# Patient Record
Sex: Female | Born: 1965 | Race: White | Hispanic: No | Smoking: Current every day smoker
Health system: Southern US, Community
[De-identification: ages and names within clinical notes are randomized; demographics above are authoritative.]

## PROBLEM LIST (undated history)

## (undated) DIAGNOSIS — E875 Hyperkalemia: Secondary | ICD-10-CM

## (undated) DIAGNOSIS — F419 Anxiety disorder, unspecified: Secondary | ICD-10-CM

## (undated) DIAGNOSIS — M549 Dorsalgia, unspecified: Secondary | ICD-10-CM

## (undated) DIAGNOSIS — G459 Transient cerebral ischemic attack, unspecified: Secondary | ICD-10-CM

## (undated) DIAGNOSIS — I779 Disorder of arteries and arterioles, unspecified: Secondary | ICD-10-CM

## (undated) DIAGNOSIS — K759 Inflammatory liver disease, unspecified: Secondary | ICD-10-CM

## (undated) DIAGNOSIS — D649 Anemia, unspecified: Secondary | ICD-10-CM

## (undated) DIAGNOSIS — J45909 Unspecified asthma, uncomplicated: Secondary | ICD-10-CM

## (undated) DIAGNOSIS — G4700 Insomnia, unspecified: Secondary | ICD-10-CM

## (undated) DIAGNOSIS — Z7982 Long term (current) use of aspirin: Secondary | ICD-10-CM

## (undated) DIAGNOSIS — M7582 Other shoulder lesions, left shoulder: Secondary | ICD-10-CM

## (undated) DIAGNOSIS — M48061 Spinal stenosis, lumbar region without neurogenic claudication: Secondary | ICD-10-CM

## (undated) DIAGNOSIS — I272 Pulmonary hypertension, unspecified: Secondary | ICD-10-CM

## (undated) DIAGNOSIS — I2510 Atherosclerotic heart disease of native coronary artery without angina pectoris: Secondary | ICD-10-CM

## (undated) DIAGNOSIS — B1920 Unspecified viral hepatitis C without hepatic coma: Secondary | ICD-10-CM

## (undated) DIAGNOSIS — K760 Fatty (change of) liver, not elsewhere classified: Secondary | ICD-10-CM

## (undated) DIAGNOSIS — I219 Acute myocardial infarction, unspecified: Secondary | ICD-10-CM

## (undated) DIAGNOSIS — F4310 Post-traumatic stress disorder, unspecified: Secondary | ICD-10-CM

## (undated) DIAGNOSIS — R0789 Other chest pain: Secondary | ICD-10-CM

## (undated) DIAGNOSIS — F1190 Opioid use, unspecified, uncomplicated: Secondary | ICD-10-CM

## (undated) DIAGNOSIS — R519 Headache, unspecified: Secondary | ICD-10-CM

## (undated) DIAGNOSIS — E785 Hyperlipidemia, unspecified: Secondary | ICD-10-CM

## (undated) DIAGNOSIS — E7800 Pure hypercholesterolemia, unspecified: Secondary | ICD-10-CM

## (undated) DIAGNOSIS — E876 Hypokalemia: Secondary | ICD-10-CM

## (undated) DIAGNOSIS — F17200 Nicotine dependence, unspecified, uncomplicated: Secondary | ICD-10-CM

## (undated) DIAGNOSIS — K219 Gastro-esophageal reflux disease without esophagitis: Secondary | ICD-10-CM

## (undated) DIAGNOSIS — G959 Disease of spinal cord, unspecified: Secondary | ICD-10-CM

## (undated) DIAGNOSIS — M961 Postlaminectomy syndrome, not elsewhere classified: Secondary | ICD-10-CM

## (undated) DIAGNOSIS — M792 Neuralgia and neuritis, unspecified: Secondary | ICD-10-CM

## (undated) DIAGNOSIS — Z8619 Personal history of other infectious and parasitic diseases: Secondary | ICD-10-CM

## (undated) DIAGNOSIS — I10 Essential (primary) hypertension: Secondary | ICD-10-CM

## (undated) DIAGNOSIS — R110 Nausea: Secondary | ICD-10-CM

## (undated) DIAGNOSIS — I700 Atherosclerosis of aorta: Secondary | ICD-10-CM

## (undated) DIAGNOSIS — M5030 Other cervical disc degeneration, unspecified cervical region: Secondary | ICD-10-CM

## (undated) DIAGNOSIS — R0600 Dyspnea, unspecified: Secondary | ICD-10-CM

## (undated) DIAGNOSIS — G43909 Migraine, unspecified, not intractable, without status migrainosus: Secondary | ICD-10-CM

## (undated) DIAGNOSIS — I739 Peripheral vascular disease, unspecified: Secondary | ICD-10-CM

## (undated) DIAGNOSIS — J449 Chronic obstructive pulmonary disease, unspecified: Secondary | ICD-10-CM

## (undated) DIAGNOSIS — G8929 Other chronic pain: Secondary | ICD-10-CM

## (undated) DIAGNOSIS — I639 Cerebral infarction, unspecified: Secondary | ICD-10-CM

## (undated) DIAGNOSIS — J189 Pneumonia, unspecified organism: Secondary | ICD-10-CM

## (undated) DIAGNOSIS — M75112 Incomplete rotator cuff tear or rupture of left shoulder, not specified as traumatic: Secondary | ICD-10-CM

## (undated) DIAGNOSIS — M1990 Unspecified osteoarthritis, unspecified site: Secondary | ICD-10-CM

## (undated) DIAGNOSIS — K769 Liver disease, unspecified: Secondary | ICD-10-CM

## (undated) HISTORY — PX: DIAGNOSTIC LAPAROSCOPY: SUR761

## (undated) HISTORY — DX: Unspecified osteoarthritis, unspecified site: M19.90

## (undated) HISTORY — DX: Gastro-esophageal reflux disease without esophagitis: K21.9

## (undated) HISTORY — PX: ORIF TIBIA & FIBULA FRACTURES: SHX2131

## (undated) HISTORY — DX: Hypokalemia: E87.6

## (undated) HISTORY — DX: Anxiety disorder, unspecified: F41.9

## (undated) HISTORY — PX: FRACTURE SURGERY: SHX138

## (undated) HISTORY — PX: OVARIAN CYST SURGERY: SHX726

## (undated) HISTORY — DX: Other chest pain: R07.89

## (undated) HISTORY — PX: BACK SURGERY: SHX140

## (undated) HISTORY — DX: Hypomagnesemia: E83.42

## (undated) HISTORY — PX: NASAL SINUS SURGERY: SHX719

## (undated) HISTORY — PX: CATARACT EXTRACTION W/ INTRAOCULAR LENS IMPLANT: SHX1309

## (undated) HISTORY — DX: Atherosclerotic heart disease of native coronary artery without angina pectoris: I25.10

## (undated) HISTORY — PX: OTHER SURGICAL HISTORY: SHX169

## (undated) HISTORY — PX: EYE SURGERY: SHX253

## (undated) HISTORY — PX: ORIF FEMUR FRACTURE: SHX2119

## (undated) HISTORY — DX: Opioid use, unspecified, uncomplicated: F11.90

## (undated) HISTORY — PX: OVARY SURGERY: SHX727

## (undated) HISTORY — PX: HEMORRHOID SURGERY: SHX153

---

## 2001-09-21 ENCOUNTER — Emergency Department (HOSPITAL_COMMUNITY): Admission: EM | Admit: 2001-09-21 | Discharge: 2001-09-21 | Payer: Self-pay | Admitting: Emergency Medicine

## 2001-11-04 ENCOUNTER — Encounter: Payer: Self-pay | Admitting: Emergency Medicine

## 2001-11-04 ENCOUNTER — Emergency Department (HOSPITAL_COMMUNITY): Admission: EM | Admit: 2001-11-04 | Discharge: 2001-11-04 | Payer: Self-pay | Admitting: Emergency Medicine

## 2001-11-17 ENCOUNTER — Encounter: Payer: Self-pay | Admitting: Emergency Medicine

## 2001-11-17 ENCOUNTER — Emergency Department (HOSPITAL_COMMUNITY): Admission: EM | Admit: 2001-11-17 | Discharge: 2001-11-17 | Payer: Self-pay

## 2002-01-19 ENCOUNTER — Encounter: Payer: Self-pay | Admitting: Emergency Medicine

## 2002-01-19 ENCOUNTER — Emergency Department (HOSPITAL_COMMUNITY): Admission: EM | Admit: 2002-01-19 | Discharge: 2002-01-19 | Payer: Self-pay | Admitting: Emergency Medicine

## 2003-05-05 ENCOUNTER — Emergency Department (HOSPITAL_COMMUNITY): Admission: EM | Admit: 2003-05-05 | Discharge: 2003-05-05 | Payer: Self-pay | Admitting: Emergency Medicine

## 2007-04-24 ENCOUNTER — Emergency Department (HOSPITAL_COMMUNITY): Admission: EM | Admit: 2007-04-24 | Discharge: 2007-04-24 | Payer: Self-pay | Admitting: Emergency Medicine

## 2007-04-27 ENCOUNTER — Emergency Department (HOSPITAL_COMMUNITY): Admission: EM | Admit: 2007-04-27 | Discharge: 2007-04-27 | Payer: Self-pay | Admitting: Emergency Medicine

## 2007-05-02 ENCOUNTER — Emergency Department (HOSPITAL_COMMUNITY): Admission: EM | Admit: 2007-05-02 | Discharge: 2007-05-02 | Payer: Self-pay | Admitting: Emergency Medicine

## 2009-06-24 HISTORY — PX: LEFT HEART CATH AND CORONARY ANGIOGRAPHY: CATH118249

## 2010-06-24 DIAGNOSIS — I219 Acute myocardial infarction, unspecified: Secondary | ICD-10-CM

## 2010-06-24 HISTORY — DX: Acute myocardial infarction, unspecified: I21.9

## 2010-07-15 ENCOUNTER — Emergency Department (HOSPITAL_COMMUNITY)
Admission: EM | Admit: 2010-07-15 | Discharge: 2010-07-15 | Payer: Self-pay | Source: Home / Self Care | Admitting: Emergency Medicine

## 2011-09-04 DIAGNOSIS — R053 Chronic cough: Secondary | ICD-10-CM | POA: Insufficient documentation

## 2011-09-04 DIAGNOSIS — J209 Acute bronchitis, unspecified: Secondary | ICD-10-CM | POA: Insufficient documentation

## 2011-09-04 DIAGNOSIS — R05 Cough: Secondary | ICD-10-CM | POA: Insufficient documentation

## 2011-09-04 DIAGNOSIS — G4733 Obstructive sleep apnea (adult) (pediatric): Secondary | ICD-10-CM | POA: Insufficient documentation

## 2012-06-24 DIAGNOSIS — G459 Transient cerebral ischemic attack, unspecified: Secondary | ICD-10-CM

## 2012-06-24 HISTORY — DX: Transient cerebral ischemic attack, unspecified: G45.9

## 2014-01-04 HISTORY — DX: Hypomagnesemia: E83.42

## 2014-03-03 DIAGNOSIS — A4101 Sepsis due to Methicillin susceptible Staphylococcus aureus: Secondary | ICD-10-CM

## 2014-03-03 HISTORY — DX: Sepsis due to methicillin susceptible Staphylococcus aureus: A41.01

## 2014-03-07 DIAGNOSIS — A4101 Sepsis due to Methicillin susceptible Staphylococcus aureus: Secondary | ICD-10-CM | POA: Insufficient documentation

## 2014-03-19 DIAGNOSIS — D638 Anemia in other chronic diseases classified elsewhere: Secondary | ICD-10-CM | POA: Insufficient documentation

## 2014-03-19 DIAGNOSIS — F1110 Opioid abuse, uncomplicated: Secondary | ICD-10-CM | POA: Insufficient documentation

## 2014-03-19 DIAGNOSIS — F1910 Other psychoactive substance abuse, uncomplicated: Secondary | ICD-10-CM | POA: Insufficient documentation

## 2014-07-07 HISTORY — PX: LEFT HEART CATH AND CORONARY ANGIOGRAPHY: CATH118249

## 2014-07-08 DIAGNOSIS — F17219 Nicotine dependence, cigarettes, with unspecified nicotine-induced disorders: Secondary | ICD-10-CM | POA: Insufficient documentation

## 2014-09-03 DIAGNOSIS — F1910 Other psychoactive substance abuse, uncomplicated: Secondary | ICD-10-CM | POA: Insufficient documentation

## 2014-09-03 DIAGNOSIS — F411 Generalized anxiety disorder: Secondary | ICD-10-CM | POA: Insufficient documentation

## 2014-09-03 DIAGNOSIS — F17200 Nicotine dependence, unspecified, uncomplicated: Secondary | ICD-10-CM | POA: Insufficient documentation

## 2014-09-03 DIAGNOSIS — I739 Peripheral vascular disease, unspecified: Secondary | ICD-10-CM | POA: Insufficient documentation

## 2014-09-03 DIAGNOSIS — F1120 Opioid dependence, uncomplicated: Secondary | ICD-10-CM | POA: Insufficient documentation

## 2015-01-17 ENCOUNTER — Emergency Department
Admission: EM | Admit: 2015-01-17 | Discharge: 2015-01-17 | Disposition: A | Discharge status: Home / Self Care | Payer: Medicaid Other | Attending: Emergency Medicine | Admitting: Emergency Medicine

## 2015-01-17 ENCOUNTER — Encounter: Payer: Self-pay | Admitting: Student

## 2015-01-17 DIAGNOSIS — I10 Essential (primary) hypertension: Secondary | ICD-10-CM | POA: Insufficient documentation

## 2015-01-17 DIAGNOSIS — M545 Low back pain, unspecified: Secondary | ICD-10-CM

## 2015-01-17 DIAGNOSIS — Z720 Tobacco use: Secondary | ICD-10-CM | POA: Insufficient documentation

## 2015-01-17 HISTORY — DX: Atherosclerotic heart disease of native coronary artery without angina pectoris: I25.10

## 2015-01-17 HISTORY — DX: Essential (primary) hypertension: I10

## 2015-01-17 MED ORDER — TIZANIDINE HCL 4 MG PO CAPS: 4.0000 mg | ORAL_CAPSULE | Freq: Four times a day (QID) | ORAL | Status: DC | PRN

## 2015-01-17 MED ORDER — OXYCODONE-ACETAMINOPHEN 5-325 MG PO TABS: 1.0000 | ORAL_TABLET | ORAL | Status: DC | PRN

## 2015-01-17 MED ORDER — PROMETHAZINE HCL 25 MG/ML IJ SOLN
12.5000 mg | Freq: Once | INTRAMUSCULAR | Status: AC
  Administered 2015-01-17: 12.5 mg via INTRAMUSCULAR
  Filled 2015-01-17: qty 1

## 2015-01-17 MED ORDER — MEPERIDINE HCL 25 MG/ML IJ SOLN
25.0000 mg | Freq: Once | INTRAMUSCULAR | Status: AC
  Administered 2015-01-17: 25 mg via INTRAMUSCULAR
  Filled 2015-01-17: qty 1

## 2015-01-17 NOTE — ED Notes (Signed)
Chronic back pain 

## 2015-01-17 NOTE — ED Notes (Signed)
Pt reports back pain for two days and noticed a "knot pop up". Pt points to right side, lower back.  States hurts worse when she moves. Denies injury.

## 2015-01-17 NOTE — Discharge Instructions (Signed)
Chronic Back Pain  When back pain lasts longer than 3 months, it is called chronic back pain.People with chronic back pain often go through certain periods that are more intense (flare-ups).  CAUSES Chronic back pain can be caused by wear and tear (degeneration) on different structures in your back. These structures include:  The bones of your spine (vertebrae) and the joints surrounding your spinal cord and nerve roots (facets).  The strong, fibrous tissues that connect your vertebrae (ligaments). Degeneration of these structures may result in pressure on your nerves. This can lead to constant pain. HOME CARE INSTRUCTIONS  Avoid bending, heavy lifting, prolonged sitting, and activities which make the problem worse.  Take brief periods of rest throughout the day to reduce your pain. Lying down or standing usually is better than sitting while you are resting.  Take over-the-counter or prescription medicines only as directed by your caregiver. SEEK IMMEDIATE MEDICAL CARE IF:   You have weakness or numbness in one of your legs or feet.  You have trouble controlling your bladder or bowels.  You have nausea, vomiting, abdominal pain, shortness of breath, or fainting. Document Released: 07/18/2004 Document Revised: 09/02/2011 Document Reviewed: 05/25/2011 [facility] Patient Information 2015 [place], [place]. This information is not intended to replace advice given to you by your health care provider. Make sure you discuss any questions you have with your health care provider.  Back Pain, Adult Low back pain is very common. About 1 in 5 people have back pain.The cause of low back pain is rarely dangerous. The pain often gets better over time.About half of people with a sudden onset of back pain feel better in just 2 weeks. About 8 in 10 people feel better by 6 weeks.  CAUSES Some common causes of back pain include:  Strain of the muscles or ligaments supporting the spine.  Wear and tear  (degeneration) of the spinal discs.  Arthritis.  Direct injury to the back. DIAGNOSIS Most of the time, the direct cause of low back pain is not known.However, back pain can be treated effectively even when the exact cause of the pain is unknown.Answering your caregiver's questions about your overall health and symptoms is one of the most accurate ways to make sure the cause of your pain is not dangerous. If your caregiver needs more information, he or she may order lab work or imaging tests (X-rays or MRIs).However, even if imaging tests show changes in your back, this usually does not require surgery. HOME CARE INSTRUCTIONS For many people, back pain returns.Since low back pain is rarely dangerous, it is often a condition that people can learn to [facility] their own.   Remain active. It is stressful on the back to sit or stand in one place. Do not sit, drive, or stand in one place for more than 30 minutes at a time. Take short walks on level surfaces as soon as pain allows.Try to increase the length of time you walk each day.  Do not stay in bed.Resting more than 1 or 2 days can delay your recovery.  Do not avoid exercise or work.Your body is made to move.It is not dangerous to be active, even though your back may hurt.Your back will likely heal faster if you return to being active before your pain is gone.  Pay attention to your body when you bend and lift. Many people have less discomfortwhen lifting if they bend their knees, keep the load close to their bodies,and avoid twisting. Often, the most comfortable  positions are those that put less stress on your recovering back.  Find a comfortable position to sleep. Use a firm mattress and lie on your side with your knees slightly bent. If you lie on your back, put a pillow under your knees.  Only take over-the-counter or prescription medicines as directed by your caregiver. Over-the-counter medicines to reduce pain and inflammation  are often the most helpful.Your caregiver may prescribe muscle relaxant drugs.These medicines help dull your pain so you can more quickly return to your normal activities and healthy exercise.  Put ice on the injured area.  Put ice in a plastic bag.  Place a towel between your skin and the bag.  Leave the ice on for 15-20 minutes, 03-04 times a day for the first 2 to 3 days. After that, ice and heat may be alternated to reduce pain and spasms.  Ask your caregiver about trying back exercises and gentle massage. This may be of some benefit.  Avoid feeling anxious or stressed.Stress increases muscle tension and can worsen back pain.It is important to recognize when you are anxious or stressed and learn ways to manage it.Exercise is a great option. SEEK MEDICAL CARE IF:  You have pain that is not relieved with rest or medicine.  You have pain that does not improve in 1 week.  You have new symptoms.  You are generally not feeling well. SEEK IMMEDIATE MEDICAL CARE IF:   You have pain that radiates from your back into your legs.  You develop new bowel or bladder control problems.  You have unusual weakness or numbness in your arms or legs.  You develop nausea or vomiting.  You develop abdominal pain.  You feel faint. Document Released: 06/10/2005 Document Revised: 12/10/2011 Document Reviewed: 10/12/2013 [facility] Patient Information 2015 [place], [place]. This information is not intended to replace advice given to you by your health care provider. Make sure you discuss any questions you have with your health care provider.

## 2015-01-17 NOTE — ED Provider Notes (Signed)
[facility] Emergency Department Provider Note  ____________________________________________  Time seen: Approximately 4:42 PM  I have reviewed the triage vital signs and the nursing notes.   HISTORY  Chief Complaint Back Pain   HPI Sara Munoz is a 49 y.o. female presents emergency room with complaints of right lower back pain. Patient has ongoing history of same and has recently moved here from [place], [place]. States attempted to lift and move her safe which weighs about 200 pounds.  Past Medical History  Diagnosis Date  . Hypertension   . Coronary artery disease     There are no active problems to display for this patient.   Past Surgical History  Procedure Laterality Date  . Orif femur fracture    . Orif tibia & fibula fractures      Current Outpatient Rx  Name  Route  Sig  Dispense  Refill  . oxyCODONE-acetaminophen (ROXICET) 5-325 MG per tablet   Oral   Take 1-2 tablets by mouth every 4 (four) hours as needed for severe pain.   15 tablet   0   . tiZANidine (ZANAFLEX) 4 MG capsule   Oral   Take 1 capsule (4 mg total) by mouth 4 (four) times daily as needed for muscle spasms.   40 capsule   0     Allergies Flexeril; Keflex; Toradol; and Tramadol  No family history on file.  Social History History  Substance Use Topics  . Smoking status: Current Every Day Smoker -- 1.00 packs/day    Types: Cigarettes  . Smokeless tobacco: Never Used  . Alcohol Use: No    Review of Systems Constitutional: No fever/chills Eyes: No visual changes. ENT: No sore throat. Cardiovascular: Denies chest pain. Respiratory: Denies shortness of breath. Gastrointestinal: No abdominal pain.  No nausea, no vomiting.  No diarrhea.  No constipation. Genitourinary: Negative for dysuria. Musculoskeletal: Positive for right lower back pain. Skin: Negative for rash. Neurological: Negative for headaches, focal weakness or numbness.  10-point ROS  otherwise negative.  ____________________________________________   PHYSICAL EXAM:  VITAL SIGNS: ED Triage Vitals  Enc Vitals Group     BP 01/17/15 1613 129/82 mmHg     Pulse Rate 01/17/15 1613 70     Resp 01/17/15 1613 18     Temp 01/17/15 1613 98.6 F (37 C)     Temp Source 01/17/15 1613 Oral     SpO2 01/17/15 1613 99 %     Weight 01/17/15 1613 160 lb (72.576 kg)     Height 01/17/15 1613 5\' 4"  (1.626 m)     Head Cir --      Peak Flow --      Pain Score 01/17/15 1614 10     Pain Loc --      Pain Edu? --      Excl. in [place]? --     Constitutional: Alert and oriented. Well appearing and in no acute distress.  Cardiovascular: Normal rate, regular rhythm. Grossly normal heart sounds.  Good peripheral circulation. Respiratory: Normal respiratory effort.  No retractions. Lungs CTAB. Musculoskeletal: Straight-leg raise positive right about 30 , left 45. Positive point tenderness to right paraspinal lumbar muscles Neurologic:  Normal speech and language. No gross focal neurologic deficits are appreciated. No gait instability. Skin:  Skin is warm, dry and intact. No rash noted. Psychiatric: Mood and affect are normal. Speech and behavior are normal.  ____________________________________________   LABS (all labs ordered are listed, but only abnormal results are displayed)  Labs Reviewed - No data to display ____________________________________________     PROCEDURES  Procedure(s) performed: None  Critical Care performed: No  ____________________________________________   INITIAL IMPRESSION / ASSESSMENT AND PLAN / ED COURSE  Pertinent labs & imaging results that were available during my care of the patient were reviewed by me and considered in my medical decision making (see chart for details).  Recurrent low back pain. Rx given for Zanaflex 4 mg 4 times a day, Norco. Patient encouraged to establish local provider and follow up with pain management as directed. She  voices no other emergency medical complaints at this visit. ____________________________________________   FINAL CLINICAL IMPRESSION(S) / ED DIAGNOSES  Final diagnoses:  Back pain at L4-L5 level      [name], [name] 01/17/15 [place], MD 01/17/15 2210

## 2015-02-01 ENCOUNTER — Encounter: Payer: Self-pay | Admitting: Emergency Medicine

## 2015-02-01 ENCOUNTER — Emergency Department: Payer: Medicaid Other

## 2015-02-01 ENCOUNTER — Emergency Department
Admission: EM | Admit: 2015-02-01 | Discharge: 2015-02-01 | Disposition: A | Discharge status: Home / Self Care | Payer: Medicaid Other | Attending: Emergency Medicine | Admitting: Emergency Medicine

## 2015-02-01 ENCOUNTER — Emergency Department
Admission: EM | Admit: 2015-02-01 | Discharge: 2015-02-01 | Disposition: A | Discharge status: Home / Self Care | Payer: Medicaid Other | Source: Home / Self Care | Attending: Emergency Medicine | Admitting: Emergency Medicine

## 2015-02-01 DIAGNOSIS — Z720 Tobacco use: Secondary | ICD-10-CM | POA: Diagnosis not present

## 2015-02-01 DIAGNOSIS — Z79899 Other long term (current) drug therapy: Secondary | ICD-10-CM | POA: Diagnosis not present

## 2015-02-01 DIAGNOSIS — Z760 Encounter for issue of repeat prescription: Secondary | ICD-10-CM | POA: Diagnosis present

## 2015-02-01 DIAGNOSIS — Z7951 Long term (current) use of inhaled steroids: Secondary | ICD-10-CM | POA: Insufficient documentation

## 2015-02-01 DIAGNOSIS — G8929 Other chronic pain: Secondary | ICD-10-CM

## 2015-02-01 DIAGNOSIS — G894 Chronic pain syndrome: Secondary | ICD-10-CM

## 2015-02-01 DIAGNOSIS — R079 Chest pain, unspecified: Secondary | ICD-10-CM

## 2015-02-01 DIAGNOSIS — I10 Essential (primary) hypertension: Secondary | ICD-10-CM | POA: Diagnosis not present

## 2015-02-01 LAB — CBC
HCT: 38.6 % (ref 35.0–47.0)
Hemoglobin: 12.8 g/dL (ref 12.0–16.0)
MCH: 27.3 pg (ref 26.0–34.0)
MCHC: 33.3 g/dL (ref 32.0–36.0)
MCV: 82.1 fL (ref 80.0–100.0)
Platelets: 244 10*3/uL (ref 150–440)
RBC: 4.7 MIL/uL (ref 3.80–5.20)
RDW: 16.7 % — ABNORMAL HIGH (ref 11.5–14.5)
WBC: 6.7 10*3/uL (ref 3.6–11.0)

## 2015-02-01 LAB — BASIC METABOLIC PANEL
Anion gap: 8 (ref 5–15)
BUN: 6 mg/dL (ref 6–20)
CO2: 25 mmol/L (ref 22–32)
Calcium: 8.9 mg/dL (ref 8.9–10.3)
Chloride: 110 mmol/L (ref 101–111)
Creatinine, Ser: 0.78 mg/dL (ref 0.44–1.00)
GFR calc Af Amer: >60 mL/min (ref >60)
GFR calc non Af Amer: >60 mL/min (ref >60)
Glucose, Bld: 110 mg/dL — ABNORMAL HIGH (ref 65–99)
Potassium: 3.3 mmol/L — ABNORMAL LOW (ref 3.5–5.1)
Sodium: 143 mmol/L (ref 135–145)

## 2015-02-01 LAB — TROPONIN I
Troponin I: <0.03 ng/mL (ref <0.031)
Troponin I: <0.03 ng/mL (ref <0.031)

## 2015-02-01 MED ORDER — MORPHINE SULFATE 2 MG/ML IJ SOLN
2.0000 mg | Freq: Once | INTRAMUSCULAR | Status: AC
  Administered 2015-02-01: 2 mg via INTRAVENOUS
  Filled 2015-02-01: qty 1

## 2015-02-01 MED ORDER — PROMETHAZINE HCL 25 MG/ML IJ SOLN
25.0000 mg | Freq: Once | INTRAMUSCULAR | Status: AC
  Administered 2015-02-01: 25 mg via INTRAMUSCULAR
  Filled 2015-02-01: qty 1

## 2015-02-01 MED ORDER — OXYCODONE-ACETAMINOPHEN 5-325 MG PO TABS
1.0000 | ORAL_TABLET | Freq: Once | ORAL | Status: AC
  Administered 2015-02-01: 1 via ORAL
  Filled 2015-02-01: qty 1

## 2015-02-01 MED ORDER — MEPERIDINE HCL 25 MG/ML IJ SOLN
25.0000 mg | Freq: Once | INTRAMUSCULAR | Status: AC
  Administered 2015-02-01: 25 mg via INTRAMUSCULAR
  Filled 2015-02-01: qty 1

## 2015-02-01 NOTE — ED Notes (Addendum)
Pt to triage via w/c with no distress noted; pt c/o "pain all over"; has been seen here recently; st out of medication x month (90mg  morphine and 40mg  oxycodone daily); st unable to get appointment with pain clinic for her chronic pain control; st when called she was told she needs a PCP to make appt; st when she left here ED physician only gave her a f/u referral to Dr Primus Bravo, not a PCP; pt st "I tried to call the [facility] clinic today but they wouldn't answer the phone"

## 2015-02-01 NOTE — Discharge Instructions (Signed)
Emergency care providers appreciate that many patients coming to [place] are in severe pain and we wish to address their pain in the safest, most responsible manner.  It is important to recognize, however, that the proper treatment of chronic pain differs from that of the pain of injuries and acute illnesses.  Our goal is to provider quality, safe, personalized care and we thank you for giving [place] the opportunity to serve you.  The use of narcotics and related agents for chronic pain syndromes may lead to additional physical and psychological problems.  Nearly as many people die from prescription narcotics each year as die from car crashes.  Additionally, this risk is increased if such prescriptions are obtained from a variety of sources.  Therefore, only your primary care physician or a pain management specialist is able to safely treat such syndromes with narcotic medications long-term.  Documentation revealing such prescriptions have been sought from multiple sources may prohibit [place] from providing a refill or different narcotic medication.  Your name may be checked first through the [facility].  This database is a record of controlled substance medication prescriptions that the patient has received.  This has been established by [facility] in an effort to eliminate the dangerous, and often life-threatening, practice of obtaining multiple prescriptions from different medical providers.  If you have a chronic pain syndrome (i.e. chronic headaches, recurrent back or neck pain, dental pain, abdominal or pelvic pain without a specific diagnosis, or neuropathic pain such as fibromyalgia) or recurrent visits for the same condition without an acute diagnosis, you may be treated with non-narcotics and other non-addictive medicines.  Allergic reactions or negative side effects that may be reported by a patient to such medications will not typically lead to the use of a  narcotic analgesic or other controlled substance as an alternative.  Patients managing chronic pain with a personal physician should have provisions in place for breakthrough pain.  If you are in crisis, you should call your physician.  If your physician directs you to the emergency department, please have the doctor call and speak to our attending physician concerning your care.  When patients come to the Emergency Department (ED) with acute medical conditions in which the ED physician feels it is appropriate to prescribe narcotic or sedating pain medication, the physician will prescribe these in very limited quantities.  The amount of these medications will last only until you can see your primary care physician in his/her office.  Any patient who returns to the ED seeking refills should expect only non-narcotic pain medications.  In the event an acute medical condition exists and the emergency physician feels it is necessary that the patient be given a narcotic or sedating medication, a responsible adult driver should be present in the room prior to the medication being given by the nurse.  Prescriptions for narcotic or sedating medications that have been lost, stolen, or expired will NOT be refilled in the ED.  Patients who have chronic pain may receive non-narcotic prescriptions until seen by their primary care physician.  It is every patient's personal responsibility to maintain active prescriptions with his or her primary care physician or specialist. Chronic Back Pain  When back pain lasts longer than 3 months, it is called chronic back pain.People with chronic back pain often go through certain periods that are more intense (flare-ups).  CAUSES Chronic back pain can be caused by wear and tear (degeneration) on different structures in your back. These  structures include:  The bones of your spine (vertebrae) and the joints surrounding your spinal cord and nerve roots (facets).  The strong,  fibrous tissues that connect your vertebrae (ligaments). Degeneration of these structures may result in pressure on your nerves. This can lead to constant pain. HOME CARE INSTRUCTIONS  Avoid bending, heavy lifting, prolonged sitting, and activities which make the problem worse.  Take brief periods of rest throughout the day to reduce your pain. Lying down or standing usually is better than sitting while you are resting.  Take over-the-counter or prescription medicines only as directed by your caregiver. SEEK IMMEDIATE MEDICAL CARE IF:   You have weakness or numbness in one of your legs or feet.  You have trouble controlling your bladder or bowels.  You have nausea, vomiting, abdominal pain, shortness of breath, or fainting. Document Released: 07/18/2004 Document Revised: 09/02/2011 Document Reviewed: 05/25/2011 [facility] Patient Information 2015 [place], [place]. This information is not intended to replace advice given to you by your health care provider. Make sure you discuss any questions you have with your health care provider.  Chronic Pain Chronic pain can be defined as pain that is off and on and lasts for 3-6 months or longer. Many things cause chronic pain, which can make it difficult to make a diagnosis. There are many treatment options available for chronic pain. However, finding a treatment that works well for you may require trying various approaches until the right one is found. Many people benefit from a combination of two or more types of treatment to control their pain. SYMPTOMS  Chronic pain can occur anywhere in the body and can range from mild to very severe. Some types of chronic pain include:  Headache.  Low back pain.  Cancer pain.  Arthritis pain.  Neurogenic pain. This is pain resulting from damage to nerves. People with chronic pain may also have other symptoms such as:  Depression.  Anger.  Insomnia.  Anxiety. DIAGNOSIS  Your health care provider  will help diagnose your condition over time. In many cases, the initial focus will be on excluding possible conditions that could be causing the pain. Depending on your symptoms, your health care provider may order tests to diagnose your condition. Some of these tests may include:   Blood tests.   CT scan.   MRI.   X-rays.   Ultrasounds.   Nerve conduction studies.  You may need to see a specialist.  TREATMENT  Finding treatment that works well may take time. You may be referred to a pain specialist. He or she may prescribe medicine or therapies, such as:   Mindful meditation or yoga.  Shots (injections) of numbing or pain-relieving medicines into the spine or area of pain.  Local electrical stimulation.  Acupuncture.   Massage therapy.   Aroma, color, light, or sound therapy.   Biofeedback.   Working with a physical therapist to keep from getting stiff.   Regular, gentle exercise.   Cognitive or behavioral therapy.   Group support.  Sometimes, surgery may be recommended.  HOME CARE INSTRUCTIONS   Take all medicines as directed by your health care provider.   Lessen stress in your life by relaxing and doing things such as listening to calming music.   Exercise or be active as directed by your health care provider.   Eat a healthy diet and include things such as vegetables, fruits, fish, and lean meats in your diet.   Keep all follow-up appointments with your health care provider.  Attend a support group with others suffering from chronic pain. SEEK MEDICAL CARE IF:   Your pain gets worse.   You develop a new pain that was not there before.   You cannot tolerate medicines given to you by your health care provider.   You have new symptoms since your last visit with your health care provider.  SEEK IMMEDIATE MEDICAL CARE IF:   You feel weak.   You have decreased sensation or numbness.   You lose control of bowel or bladder  function.   Your pain suddenly gets much worse.   You develop shaking.  You develop chills.  You develop confusion.  You develop chest pain.  You develop shortness of breath.  MAKE SURE YOU:  Understand these instructions.  Will watch your condition.  Will get help right away if you are not doing well or get worse.  Document Released: 03/02/2002 Document Revised: 02/10/2013 Document Reviewed: 12/04/2012 [facility] Patient Information 2015 [place], [place]. This information is not intended to replace advice given to you by your health care provider. Make sure you discuss any questions you have with your health care provider.  ------------------------------------------------------------------------------------------------------------------------------------------------------------------------------------------------------------------------------------------------------------------- Emergency care providers appreciate that many patients coming to [place] are in severe pain and we wish to address their pain in the safest, most responsible manner.  It is important to recognize, however, that the proper treatment of chronic pain differs from that of the pain of injuries and acute illnesses.  Our goal is to provider quality, safe, personalized care and we thank you for giving [place] the opportunity to serve you.  The use of narcotics and related agents for chronic pain syndromes may lead to additional physical and psychological problems.  Nearly as many people die from prescription narcotics each year as die from car crashes.  Additionally, this risk is increased if such prescriptions are obtained from a variety of sources.  Therefore, only your primary care physician or a pain management specialist is able to safely treat such syndromes with narcotic medications long-term.  Documentation revealing such prescriptions have been sought from multiple sources may prohibit [place] from providing a refill or  different narcotic medication.  Your name may be checked first through the [facility].  This database is a record of controlled substance medication prescriptions that the patient has received.  This has been established by [facility] in an effort to eliminate the dangerous, and often life-threatening, practice of obtaining multiple prescriptions from different medical providers.  If you have a chronic pain syndrome (i.e. chronic headaches, recurrent back or neck pain, dental pain, abdominal or pelvic pain without a specific diagnosis, or neuropathic pain such as fibromyalgia) or recurrent visits for the same condition without an acute diagnosis, you may be treated with non-narcotics and other non-addictive medicines.  Allergic reactions or negative side effects that may be reported by a patient to such medications will not typically lead to the use of a narcotic analgesic or other controlled substance as an alternative.  Patients managing chronic pain with a personal physician should have provisions in place for breakthrough pain.  If you are in crisis, you should call your physician.  If your physician directs you to the emergency department, please have the doctor call and speak to our attending physician concerning your care.  When patients come to the Emergency Department (ED) with acute medical conditions in which the ED physician feels it is appropriate to prescribe narcotic or sedating pain medication, the physician will  prescribe these in very limited quantities.  The amount of these medications will last only until you can see your primary care physician in his/her office.  Any patient who returns to the ED seeking refills should expect only non-narcotic pain medications.  In the event an acute medical condition exists and the emergency physician feels it is necessary that the patient be given a narcotic or sedating medication, a responsible adult  driver should be present in the room prior to the medication being given by the nurse.  Prescriptions for narcotic or sedating medications that have been lost, stolen, or expired will NOT be refilled in the ED.  Patients who have chronic pain may receive non-narcotic prescriptions until seen by their primary care physician.  It is every patient's personal responsibility to maintain active prescriptions with his or her primary care physician or specialist.

## 2015-02-01 NOTE — Discharge Instructions (Signed)
Chest Pain (Nonspecific) °It is often hard to give a specific diagnosis for the cause of chest pain. There is always a chance that your pain could be related to something serious, such as a heart attack or a blood clot in the lungs. You need to follow up with your health care provider for further evaluation. °CAUSES  °· Heartburn. °· Pneumonia or bronchitis. °· Anxiety or stress. °· Inflammation around your heart (pericarditis) or lung (pleuritis or pleurisy). °· A blood clot in the lung. °· A collapsed lung (pneumothorax). It can develop suddenly on its own (spontaneous pneumothorax) or from trauma to the chest. °· Shingles infection (herpes zoster virus). °The chest wall is composed of bones, muscles, and cartilage. Any of these can be the source of the pain. °· The bones can be bruised by injury. °· The muscles or cartilage can be strained by coughing or overwork. °· The cartilage can be affected by inflammation and become sore (costochondritis). °DIAGNOSIS  °Lab tests or other studies may be needed to find the cause of your pain. Your health care provider may have you take a test called an ambulatory electrocardiogram (ECG). An ECG records your heartbeat patterns over a 24-hour period. You may also have other tests, such as: °· Transthoracic echocardiogram (TTE). During echocardiography, sound waves are used to evaluate how blood flows through your heart. °· Transesophageal echocardiogram (TEE). °· Cardiac monitoring. This allows your health care provider to monitor your heart rate and rhythm in real time. °· Holter monitor. This is a portable device that records your heartbeat and can help diagnose heart arrhythmias. It allows your health care provider to track your heart activity for several days, if needed. °· Stress tests by exercise or by giving medicine that makes the heart beat faster. °TREATMENT  °· Treatment depends on what may be causing your chest pain. Treatment may include: °· Acid blockers for  heartburn. °· Anti-inflammatory medicine. °· Pain medicine for inflammatory conditions. °· Antibiotics if an infection is present. °· You may be advised to change lifestyle habits. This includes stopping smoking and avoiding alcohol, caffeine, and chocolate. °· You may be advised to keep your head raised (elevated) when sleeping. This reduces the chance of acid going backward from your stomach into your esophagus. °Most of the time, nonspecific chest pain will improve within 2-3 days with rest and mild pain medicine.  °HOME CARE INSTRUCTIONS  °· If antibiotics were prescribed, take them as directed. Finish them even if you start to feel better. °· For the next few days, avoid physical activities that bring on chest pain. Continue physical activities as directed. °· Do not use any tobacco products, including cigarettes, chewing tobacco, or electronic cigarettes. °· Avoid drinking alcohol. °· Only take medicine as directed by your health care provider. °· Follow your health care provider's suggestions for further testing if your chest pain does not go away. °· Keep any follow-up appointments you made. If you do not go to an appointment, you could develop lasting (chronic) problems with pain. If there is any problem keeping an appointment, call to reschedule. °SEEK MEDICAL CARE IF:  °· Your chest pain does not go away, even after treatment. °· You have a rash with blisters on your chest. °· You have a fever. °SEEK IMMEDIATE MEDICAL CARE IF:  °· You have increased chest pain or pain that spreads to your arm, neck, jaw, back, or abdomen. °· You have shortness of breath. °· You have an increasing cough, or you cough   up blood.  You have severe back or abdominal pain.  You feel nauseous or vomit.  You have severe weakness.  You faint.  You have chills. This is an emergency. Do not wait to see if the pain will go away. Get medical help at once. Call your local emergency services (911 in [place] Do not drive  yourself to the hospital. MAKE SURE YOU:   Understand these instructions.  Will watch your condition.  Will get help right away if you are not doing well or get worse. Document Released: 03/20/2005 Document Revised: 06/15/2013 Document Reviewed: 01/14/2008 [facility] Patient Information 2015 [place], [place]. This information is not intended to replace advice given to you by your health care provider. Make sure you discuss any questions you have with your health care provider.  Chronic Pain Chronic pain can be defined as pain that is off and on and lasts for 3-6 months or longer. Many things cause chronic pain, which can make it difficult to make a diagnosis. There are many treatment options available for chronic pain. However, finding a treatment that works well for you may require trying various approaches until the right one is found. Many people benefit from a combination of two or more types of treatment to control their pain. SYMPTOMS  Chronic pain can occur anywhere in the body and can range from mild to very severe. Some types of chronic pain include:  Headache.  Low back pain.  Cancer pain.  Arthritis pain.  Neurogenic pain. This is pain resulting from damage to nerves. People with chronic pain may also have other symptoms such as:  Depression.  Anger.  Insomnia.  Anxiety. DIAGNOSIS  Your health care provider will help diagnose your condition over time. In many cases, the initial focus will be on excluding possible conditions that could be causing the pain. Depending on your symptoms, your health care provider may order tests to diagnose your condition. Some of these tests may include:   Blood tests.   CT scan.   MRI.   X-rays.   Ultrasounds.   Nerve conduction studies.  You may need to see a specialist.  TREATMENT  Finding treatment that works well may take time. You may be referred to a pain specialist. He or she may prescribe medicine or therapies,  such as:   Mindful meditation or yoga.  Shots (injections) of numbing or pain-relieving medicines into the spine or area of pain.  Local electrical stimulation.  Acupuncture.   Massage therapy.   Aroma, color, light, or sound therapy.   Biofeedback.   Working with a physical therapist to keep from getting stiff.   Regular, gentle exercise.   Cognitive or behavioral therapy.   Group support.  Sometimes, surgery may be recommended.  HOME CARE INSTRUCTIONS   Take all medicines as directed by your health care provider.   Lessen stress in your life by relaxing and doing things such as listening to calming music.   Exercise or be active as directed by your health care provider.   Eat a healthy diet and include things such as vegetables, fruits, fish, and lean meats in your diet.   Keep all follow-up appointments with your health care provider.   Attend a support group with others suffering from chronic pain. SEEK MEDICAL CARE IF:   Your pain gets worse.   You develop a new pain that was not there before.   You cannot tolerate medicines given to you by your health care provider.   You  have new symptoms since your last visit with your health care provider.  SEEK IMMEDIATE MEDICAL CARE IF:   You feel weak.   You have decreased sensation or numbness.   You lose control of bowel or bladder function.   Your pain suddenly gets much worse.   You develop shaking.  You develop chills.  You develop confusion.  You develop chest pain.  You develop shortness of breath.  MAKE SURE YOU:  Understand these instructions.  Will watch your condition.  Will get help right away if you are not doing well or get worse. Document Released: 03/02/2002 Document Revised: 02/10/2013 Document Reviewed: 12/04/2012 [facility] Patient Information 2015 [place], [place]. This information is not intended to replace advice given to you by your health care provider.  Make sure you discuss any questions you have with your health care provider.

## 2015-02-01 NOTE — ED Provider Notes (Signed)
[facility] Emergency Department Provider Note  ____________________________________________  Time seen: Approximately 9:07 PM  I have reviewed the triage vital signs and the nursing notes.   HISTORY  Chief Complaint Medication Refill    HPI Sara Munoz is a 49 y.o. female presents to the emergency room after being seen here earlier this morning.  She has history of chronic pain and stopped her pain medication cold [place] one month ago.  She was given a referral to pain management this morning, however, she states that when she called, they told her she needs a PCP referral.  She moved to the area just over a month ago and does not currently have a PCP.  She says she is going through withdrawals from her medication and is in severe pain and is antsy and unable to sleep.    Past Medical History  Diagnosis Date  . Hypertension   . Coronary artery disease     There are no active problems to display for this patient.   Past Surgical History  Procedure Laterality Date  . Orif femur fracture    . Orif tibia & fibula fractures      Current Outpatient Rx  Name  Route  Sig  Dispense  Refill  . albuterol (PROVENTIL HFA;VENTOLIN HFA) 108 (90 BASE) MCG/ACT inhaler   Inhalation   Inhale 1 puff into the lungs every 4 (four) hours as needed for wheezing or shortness of breath.         Marland Kitchen atenolol (TENORMIN) 50 MG tablet   Oral   Take 1 tablet by mouth daily.      0   . atorvastatin (LIPITOR) 20 MG tablet   Oral   Take 1 tablet by mouth daily.      5   . budesonide-formoterol (SYMBICORT) 160-4.5 MCG/ACT inhaler   Inhalation   Inhale 2 puffs into the lungs 2 (two) times daily.         . clonazePAM (KLONOPIN) 1 MG tablet   Oral   Take 1 tablet by mouth 2 (two) times daily.      0   . clopidogrel (PLAVIX) 75 MG tablet   Oral   Take 1 tablet by mouth daily.      5   . DEXILANT 60 MG capsule   Oral   Take 1 capsule by mouth daily.       0     Dispense as written.   . gabapentin (NEURONTIN) 300 MG capsule   Oral   Take 2 capsules by mouth 3 (three) times daily.      2   . lisinopril (PRINIVIL,ZESTRIL) 20 MG tablet   Oral   Take 1 tablet by mouth daily.      5   . metoprolol tartrate (LOPRESSOR) 25 MG tablet   Oral   Take 1 tablet by mouth daily.      11   . ondansetron (ZOFRAN-ODT) 8 MG disintegrating tablet   Oral   Take 1 tablet by mouth every 8 (eight) hours as needed.      0   . oxyCODONE-acetaminophen (ROXICET) 5-325 MG per tablet   Oral   Take 1-2 tablets by mouth every 4 (four) hours as needed for severe pain.   15 tablet   0   . potassium chloride (K-DUR,KLOR-CON) 10 MEQ tablet   Oral   Take 1 tablet by mouth daily.      0   . RANEXA 500 MG 12 hr  tablet   Oral   Take 1 tablet by mouth daily.      11     Dispense as written.   Marland Kitchen tiZANidine (ZANAFLEX) 4 MG capsule   Oral   Take 1 capsule (4 mg total) by mouth 4 (four) times daily as needed for muscle spasms. Patient taking differently: Take 4 mg by mouth 2 (two) times daily.    40 capsule   0   . zolpidem (AMBIEN) 10 MG tablet   Oral   Take 1 tablet by mouth daily.      0     Allergies Flexeril; Keflex; Toradol; and Tramadol  No family history on file.  Social History Social History  Substance Use Topics  . Smoking status: Current Every Day Smoker -- 1.00 packs/day    Types: Cigarettes  . Smokeless tobacco: Never Used  . Alcohol Use: No    Review of Systems Constitutional: "antsy" and unable to sleep. Cardiovascular: Denies chest pain. Respiratory: Denies shortness of breath. Gastrointestinal: No abdominal pain.  No nausea, no vomiting.  No diarrhea.  No constipation. Genitourinary: Negative for dysuria. Musculoskeletal: Positive for back pain and bilateral leg pain. Skin: Negative for rash. Neurological: Negative for headaches, focal weakness or numbness.  10-point ROS otherwise  negative.  ____________________________________________   PHYSICAL EXAM:  VITAL SIGNS: ED Triage Vitals  Enc Vitals Group     BP 02/01/15 2032 152/95 mmHg     Pulse Rate 02/01/15 2032 78     Resp 02/01/15 2032 20     Temp 02/01/15 2032 98.5 F (36.9 C)     Temp Source 02/01/15 2032 Oral     SpO2 02/01/15 2032 100 %     Weight 02/01/15 2032 160 lb (72.576 kg)     Height 02/01/15 2032 5\' 4"  (1.626 m)     Head Cir --      Peak Flow --      Pain Score --      Pain Loc --      Pain Edu? --      Excl. in [place]? --     Constitutional: Alert and oriented. Appears uncomfortable in the bed and is "antsy" Cardiovascular: Normal rate, regular rhythm. Grossly normal heart sounds.  Good peripheral circulation. Respiratory: Normal respiratory effort.  No retractions. Lungs CTAB. Gastrointestinal: Soft and nontender. No distention. No abdominal bruits. No CVA tenderness. Musculoskeletal: Pain in the back and bilateral lower extremities. Neurologic:  Normal speech and language. No gross focal neurologic deficits are appreciated. No gait instability. Skin:  Skin is warm, dry and intact. No rash noted. Psychiatric: Appears to be on edge and distracted.  ____________________________________________   LABS (all labs ordered are listed, but only abnormal results are displayed)  Labs Reviewed - No data to display ____________________________________________    PROCEDURES  Procedure(s) performed: None  Critical Care performed: No  ____________________________________________   INITIAL IMPRESSION / ASSESSMENT AND PLAN / ED COURSE  Pertinent labs & imaging results that were available during my care of the patient were reviewed by me and considered in my medical decision making (see chart for details).  Patient presents with request for pain medication.  She has been without any pain medication for one month.  She was referred to pain management this morning after being seen in the  emergency room but needs a PCP referral to be seen.  Patient was seen one month ago in the emergency room and was told at that point that she needed to  find a PCP in the area.  She still has not contacted a PCP.  No narcotic pain medication will be prescribed at this time. One-time injection of Demerol 25 mg with Phenergan 25 mg IM given now. Patient voices understanding that she will not return for any pain medication. ____________________________________________   FINAL CLINICAL IMPRESSION(S) / ED DIAGNOSES  Final diagnoses:  Chronic pain disorder     [name], [name] 02/01/15 2137  Carrie Mew, MD 02/01/15 2244

## 2015-02-01 NOTE — ED Provider Notes (Signed)
[facility] Emergency Department Provider Note  ____________________________________________  Time seen: 5:30 AM  I have reviewed the triage vital signs and the nursing notes.   HISTORY  Chief Complaint Chest Pain      HPI Sara Munoz is a 49 y.o. female presents with generalized pain including left chest pain with radiation to the left arm since yesterday. Patient admits to previous history of the same. Patient states that she was told it was angina. In addition patient admits to bilateral arm low back and leg pain which is chronic patient states that she was followed by chronic pain physician for 6 years and was prescribed morphine and OxyContin however has been out of her medications times approximately one month since moving to [facility]     Past Medical History  Diagnosis Date  . Hypertension   . Coronary artery disease     There are no active problems to display for this patient.   Past Surgical History  Procedure Laterality Date  . Orif femur fracture    . Orif tibia & fibula fractures      Current Outpatient Rx  Name  Route  Sig  Dispense  Refill  . albuterol (PROVENTIL HFA;VENTOLIN HFA) 108 (90 BASE) MCG/ACT inhaler   Inhalation   Inhale 1 puff into the lungs every 4 (four) hours as needed for wheezing or shortness of breath.         Marland Kitchen atenolol (TENORMIN) 50 MG tablet   Oral   Take 1 tablet by mouth daily.      0   . atorvastatin (LIPITOR) 20 MG tablet   Oral   Take 1 tablet by mouth daily.      5   . budesonide-formoterol (SYMBICORT) 160-4.5 MCG/ACT inhaler   Inhalation   Inhale 2 puffs into the lungs 2 (two) times daily.         . clonazePAM (KLONOPIN) 1 MG tablet   Oral   Take 1 tablet by mouth 2 (two) times daily.      0   . clopidogrel (PLAVIX) 75 MG tablet   Oral   Take 1 tablet by mouth daily.      5   . DEXILANT 60 MG capsule   Oral   Take 1 capsule by mouth daily.      0     Dispense as  written.   . gabapentin (NEURONTIN) 300 MG capsule   Oral   Take 2 capsules by mouth 3 (three) times daily.      2   . lisinopril (PRINIVIL,ZESTRIL) 20 MG tablet   Oral   Take 1 tablet by mouth daily.      5   . metoprolol tartrate (LOPRESSOR) 25 MG tablet   Oral   Take 1 tablet by mouth daily.      11   . ondansetron (ZOFRAN-ODT) 8 MG disintegrating tablet   Oral   Take 1 tablet by mouth every 8 (eight) hours as needed.      0   . oxyCODONE-acetaminophen (ROXICET) 5-325 MG per tablet   Oral   Take 1-2 tablets by mouth every 4 (four) hours as needed for severe pain.   15 tablet   0   . potassium chloride (K-DUR,KLOR-CON) 10 MEQ tablet   Oral   Take 1 tablet by mouth daily.      0   . RANEXA 500 MG 12 hr tablet   Oral   Take 1 tablet by mouth daily.  11     Dispense as written.   Marland Kitchen tiZANidine (ZANAFLEX) 4 MG capsule   Oral   Take 1 capsule (4 mg total) by mouth 4 (four) times daily as needed for muscle spasms. Patient taking differently: Take 4 mg by mouth 2 (two) times daily.    40 capsule   0   . zolpidem (AMBIEN) 10 MG tablet   Oral   Take 1 tablet by mouth daily.      0     Allergies Flexeril; Keflex; Toradol; and Tramadol  No family history on file.  Social History Social History  Substance Use Topics  . Smoking status: Current Every Day Smoker -- 1.00 packs/day    Types: Cigarettes  . Smokeless tobacco: Never Used  . Alcohol Use: No    Review of Systems  Constitutional: Negative for fever. Eyes: Negative for visual changes. ENT: Negative for sore throat. Cardiovascular: Positive for chest pain. Respiratory: Negative for shortness of breath. Gastrointestinal: Negative for abdominal pain, vomiting and diarrhea. Genitourinary: Negative for dysuria. Musculoskeletal: Positive for back pain. Positive bilateral lower extremity and upper extremity pain Skin: Negative for rash. Neurological: Negative for headaches, focal weakness or  numbness.   10-point ROS otherwise negative.  ____________________________________________   PHYSICAL EXAM:  VITAL SIGNS: ED Triage Vitals  Enc Vitals Group     BP 02/01/15 0205 156/96 mmHg     Pulse Rate 02/01/15 0205 79     Resp 02/01/15 0205 18     Temp 02/01/15 0205 97.9 F (36.6 C)     Temp Source 02/01/15 0205 Oral     SpO2 02/01/15 0205 97 %     Weight 02/01/15 0205 160 lb (72.576 kg)     Height 02/01/15 0205 5\' 4"  (1.626 m)     Head Cir --      Peak Flow --      Pain Score 02/01/15 0206 10     Pain Loc --      Pain Edu? --      Excl. in [place]? --      Constitutional: Alert and oriented. Well appearing and in no distress. Eyes: Conjunctivae are normal. PERRL. Normal extraocular movements. ENT   Head: Normocephalic and atraumatic.   Nose: No congestion/rhinnorhea.   Mouth/Throat: Mucous membranes are moist.   Neck: No stridor. Cardiovascular: Normal rate, regular rhythm. Normal and symmetric distal pulses are present in all extremities. No murmurs, rubs, or gallops. Respiratory: Normal respiratory effort without tachypnea nor retractions. Breath sounds are clear and equal bilaterally. No wheezes/rales/rhonchi. Gastrointestinal: Soft and nontender. No distention. There is no CVA tenderness. Genitourinary: deferred Musculoskeletal: Nontender with normal range of motion in all extremities. No joint effusions.  No lower extremity tenderness nor edema. Neurologic:  Normal speech and language. No gross focal neurologic deficits are appreciated. Speech is normal.  Skin:  Skin is warm, dry and intact. No rash noted. Psychiatric: Mood and affect are normal. Speech and behavior are normal. Patient exhibits appropriate insight and judgment.  ____________________________________________    LABS (pertinent positives/negatives)  Labs Reviewed  BASIC METABOLIC PANEL - Abnormal; Notable for the following:    Potassium 3.3 (*)    Glucose, Bld 110 (*)    All other  components within normal limits  CBC - Abnormal; Notable for the following:    RDW 16.7 (*)    All other components within normal limits  TROPONIN I  TROPONIN I     ____________________________________________   EKG  ED ECG REPORT  I, BROWN, Traer N, the attending physician, personally viewed and interpreted this ECG.   Date: 02/01/2015  EKG Time: 2:12 AM  Rate:79  Rhythm: Normal sinus rhythm  Axis: None  Intervals: Normal  ST&T Change: None   ____________________________________________    RADIOLOGY    DG Chest 2 View (Final result) Result time: 02/01/15 03:10:52   Final result by Rad Results In Interface (02/01/15 03:10:52)   Narrative:   CLINICAL DATA: Acute onset of left-sided chest pain, radiating to left arm and face. Nausea and cough. Initial encounter.  EXAM: CHEST 2 VIEW  COMPARISON: None.  FINDINGS: The lungs are well-aerated and clear. There is no evidence of focal opacification, pleural effusion or pneumothorax.  The heart is normal in size; the mediastinal contour is within normal limits. No acute osseous abnormalities are seen.  IMPRESSION: No acute cardiopulmonary process seen.   Electronically Signed By: Garald Balding M.D. On: 02/01/2015 03:10       INITIAL IMPRESSION / ASSESSMENT AND PLAN / ED COURSE  Pertinent labs & imaging results that were available during my care of the patient were reviewed by me and considered in my medical decision making (see chart for details).    ____________________________________________   FINAL CLINICAL IMPRESSION(S) / ED DIAGNOSES  Final diagnoses:  Chronic pain  Chest pain, unspecified chest pain type      Gregor Hams, MD 02/01/15 [phone]

## 2015-02-01 NOTE — ED Notes (Signed)
NAD Noted at time of D/C. Pt denies questions/concerns at this time. Pt taken to lobby via wheelchair.

## 2015-02-01 NOTE — ED Notes (Signed)
Pt to triage via w/c with no distress noted; pt c/o left sided CP radiating into left arm and face since yesterday; st hx of same

## 2015-02-08 ENCOUNTER — Encounter (HOSPITAL_COMMUNITY): Payer: Self-pay | Admitting: *Deleted

## 2015-02-08 ENCOUNTER — Emergency Department (HOSPITAL_COMMUNITY)
Admission: EM | Admit: 2015-02-08 | Discharge: 2015-02-08 | Disposition: A | Discharge status: Home / Self Care | Payer: Medicaid Other | Attending: Emergency Medicine | Admitting: Emergency Medicine

## 2015-02-08 DIAGNOSIS — Y998 Other external cause status: Secondary | ICD-10-CM | POA: Insufficient documentation

## 2015-02-08 DIAGNOSIS — S3992XA Unspecified injury of lower back, initial encounter: Secondary | ICD-10-CM | POA: Diagnosis present

## 2015-02-08 DIAGNOSIS — I10 Essential (primary) hypertension: Secondary | ICD-10-CM | POA: Insufficient documentation

## 2015-02-08 DIAGNOSIS — Y92009 Unspecified place in unspecified non-institutional (private) residence as the place of occurrence of the external cause: Secondary | ICD-10-CM | POA: Insufficient documentation

## 2015-02-08 DIAGNOSIS — I2510 Atherosclerotic heart disease of native coronary artery without angina pectoris: Secondary | ICD-10-CM | POA: Diagnosis not present

## 2015-02-08 DIAGNOSIS — Z720 Tobacco use: Secondary | ICD-10-CM | POA: Diagnosis not present

## 2015-02-08 DIAGNOSIS — W1839XA Other fall on same level, initial encounter: Secondary | ICD-10-CM | POA: Insufficient documentation

## 2015-02-08 DIAGNOSIS — M545 Low back pain, unspecified: Secondary | ICD-10-CM

## 2015-02-08 DIAGNOSIS — Y9389 Activity, other specified: Secondary | ICD-10-CM | POA: Diagnosis not present

## 2015-02-08 DIAGNOSIS — Z7902 Long term (current) use of antithrombotics/antiplatelets: Secondary | ICD-10-CM | POA: Insufficient documentation

## 2015-02-08 DIAGNOSIS — G8929 Other chronic pain: Secondary | ICD-10-CM | POA: Insufficient documentation

## 2015-02-08 DIAGNOSIS — Z79899 Other long term (current) drug therapy: Secondary | ICD-10-CM | POA: Insufficient documentation

## 2015-02-08 MED ORDER — KETOROLAC TROMETHAMINE 60 MG/2ML IM SOLN
60.0000 mg | Freq: Once | INTRAMUSCULAR | Status: AC
  Administered 2015-02-08: 60 mg via INTRAMUSCULAR
  Filled 2015-02-08: qty 2

## 2015-02-08 NOTE — ED Notes (Addendum)
Pt reports a fall yesterday. Pt states that she tripped on a board. Pt reports back pain since the fall. Pt has previous back injury and states that this has made it come back. Pt denies dizziness, lightheadedness, bowel or bladder changes. Pt recently moved here and is looking for a new pain MD

## 2015-02-08 NOTE — ED Provider Notes (Signed)
History  This chart was scribed for non-physician practitioner, Jaquita Folds, [name],working with Forde Dandy, MD, by Marlowe Kays, ED Scribe. This patient was seen in room TR08C/TR08C and the patient's care was started at 5:41 PM.  Chief Complaint  Patient presents with  . Back Pain   The history is provided by the patient and medical records. No language interpreter was used.    HPI Comments:  Sara Munoz is a 49 y.o. female who presents to the Emergency Department complaining of a fall that occurred yesterday at her home. She states she fell on her porch which exacerbated her chronic back pain. She reports severe lower back pain across the entire back. Pt rates pain at 9/10. She reports using her tens unit and applying Voltaren gel with no significant relief of the pain. She reports that she was getting injections in her back and having the nerves "burned" but needs a referral for pain management. She denies modifying factors. She denies head trauma, LOC, bowel or bladder incontinence, numbness, tingling or weakness of BLE, bruising, wounds, nausea, vomiting, fever or chills.   Past Medical History  Diagnosis Date  . Hypertension   . Coronary artery disease    Past Surgical History  Procedure Laterality Date  . Orif femur fracture    . Orif tibia & fibula fractures     No family history on file. Social History  Substance Use Topics  . Smoking status: Current Every Day Smoker -- 1.00 packs/day    Types: Cigarettes  . Smokeless tobacco: Never Used  . Alcohol Use: No   OB History    No data available     Review of Systems  Constitutional: Negative for fever and chills.  Gastrointestinal: Negative for nausea and vomiting.  Genitourinary:       No bowel or bladder incontinence  Musculoskeletal: Positive for back pain.  Skin: Negative for color change and wound.  Neurological: Negative for weakness and numbness.  All other systems reviewed and are  negative.   Allergies  Flexeril; Keflex; Toradol; and Tramadol  Home Medications   Prior to Admission medications   Medication Sig Start Date End Date Taking? Authorizing Provider  albuterol (PROVENTIL HFA;VENTOLIN HFA) 108 (90 BASE) MCG/ACT inhaler Inhale 1 puff into the lungs every 4 (four) hours as needed for wheezing or shortness of breath.   Yes Historical Provider, MD  atorvastatin (LIPITOR) 20 MG tablet Take 1 tablet by mouth daily. 11/30/14  Yes Historical Provider, MD  budesonide-formoterol (SYMBICORT) 160-4.5 MCG/ACT inhaler Inhale 2 puffs into the lungs 2 (two) times daily.   Yes Historical Provider, MD  clonazePAM (KLONOPIN) 1 MG tablet Take 1 tablet by mouth 2 (two) times daily. 11/09/14  Yes Historical Provider, MD  clopidogrel (PLAVIX) 75 MG tablet Take 1 tablet by mouth daily. 12/30/14  Yes Historical Provider, MD  DEXILANT 60 MG capsule Take 1 capsule by mouth daily. 12/30/14  Yes Historical Provider, MD  gabapentin (NEURONTIN) 300 MG capsule Take 2 capsules by mouth 3 (three) times daily. 12/09/14  Yes Historical Provider, MD  lisinopril (PRINIVIL,ZESTRIL) 20 MG tablet Take 1 tablet by mouth daily. 12/30/14  Yes Historical Provider, MD  metoprolol tartrate (LOPRESSOR) 25 MG tablet Take 1 tablet by mouth daily. 12/22/14  Yes Historical Provider, MD  ondansetron (ZOFRAN-ODT) 8 MG disintegrating tablet Take 1 tablet by mouth every 8 (eight) hours as needed for nausea or vomiting.  11/09/14  Yes Historical Provider, MD  potassium chloride (K-DUR,KLOR-CON) 10 MEQ tablet Take  1 tablet by mouth daily. 12/21/14  Yes Historical Provider, MD  RANEXA 500 MG 12 hr tablet Take 1 tablet by mouth daily. 12/01/14  Yes Historical Provider, MD  zolpidem (AMBIEN) 10 MG tablet Take 1 tablet by mouth daily. 11/09/14  Yes Historical Provider, MD   Triage Vitals: BP 150/87 mmHg  Pulse 74  Temp(Src) 98.3 F (36.8 C) (Oral)  Resp 20  SpO2 98%  LMP 01/28/2015 (Exact Date) Physical Exam  Constitutional: She  is oriented to person, place, and time. She appears well-developed and well-nourished.  HENT:  Head: Normocephalic and atraumatic.  Mouth/Throat: Uvula is midline, oropharynx is clear and moist and mucous membranes are normal.  Eyes: EOM are normal.  Neck: Normal range of motion.  Cardiovascular: Normal rate, regular rhythm and normal heart sounds.   No murmur heard. Pulmonary/Chest: Effort normal and breath sounds normal. No respiratory distress.  Abdominal: Soft. There is no tenderness.  Musculoskeletal: Normal range of motion. She exhibits tenderness.  Tenderness diffusely throughout right lumbar region. No bony tenderness or crepitus. No lesions or deformities. Maintains full active ROM of BLE. Gait is baseline.  Neurological: She is alert and oriented to person, place, and time.  Skin: Skin is warm and dry.  Psychiatric: She has a normal mood and affect. Her behavior is normal.  Nursing note and vitals reviewed.   ED Course  Procedures (including critical care time) DIAGNOSTIC STUDIES: Oxygen Saturation is 98% on RA, normal by my interpretation.   COORDINATION OF CARE: 5:45 PM- Will give injection of Toradol prior to discharge and refer to [facility]. Pt verbalizes understanding and agrees to plan.  Medications  ketorolac (TORADOL) injection 60 mg (60 mg Intramuscular Given 02/08/15 1754)    Labs Review Labs Reviewed - No data to display  Imaging Review No results found. I have personally reviewed and evaluated these images and lab results as part of my medical decision-making.   EKG Interpretation None     Meds given in ED:  Medications  ketorolac (TORADOL) injection 60 mg (60 mg Intramuscular Given 02/08/15 1754)    Discharge Medication List as of 02/08/2015  6:08 PM     Filed Vitals:   02/08/15 1649  BP: 150/87  Pulse: 74  Temp: 98.3 F (36.8 C)  TempSrc: Oral  Resp: 20  SpO2: 98%  ,   MDM  Vitals stable - WNL -afebrile Pt resting  comfortably in ED. Patient with acute on chronic back pain. Given Toradol injection in the ED as well as referral to community health and wellness for further evaluation and management of symptoms. Patient requesting outpatient prescription for narcotic pain medicine. Advised we will not be doing that. Discussed patient may pursue pain clinic options with primary care. No evidence of acute or emergent pathology at this time.  I discussed all relevant lab findings and imaging results with pt and they verbalized understanding. Discussed f/u with PCP within 48 hrs and return precautions, pt very amenable to plan.  Final diagnoses:  Right-sided low back pain without sciatica     I personally performed the services described in this documentation, which was scribed in my presence. The recorded information has been reviewed and is accurate.    Comer Locket, [name] 02/08/15 1933  Forde Dandy, MD 02/09/15 0200

## 2015-02-08 NOTE — Discharge Instructions (Signed)
Please follow-up with [facility] and wellness for definitive medical care. Return to ED for worsening symptoms.  Back Pain, Adult Low back pain is very common. About 1 in 5 people have back pain.The cause of low back pain is rarely dangerous. The pain often gets better over time.About half of people with a sudden onset of back pain feel better in just 2 weeks. About 8 in 10 people feel better by 6 weeks.  CAUSES Some common causes of back pain include:  Strain of the muscles or ligaments supporting the spine.  Wear and tear (degeneration) of the spinal discs.  Arthritis.  Direct injury to the back. DIAGNOSIS Most of the time, the direct cause of low back pain is not known.However, back pain can be treated effectively even when the exact cause of the pain is unknown.Answering your caregiver's questions about your overall health and symptoms is one of the most accurate ways to make sure the cause of your pain is not dangerous. If your caregiver needs more information, he or she may order lab work or imaging tests (X-rays or MRIs).However, even if imaging tests show changes in your back, this usually does not require surgery. HOME CARE INSTRUCTIONS For many people, back pain returns.Since low back pain is rarely dangerous, it is often a condition that people can learn to [facility] their own.   Remain active. It is stressful on the back to sit or stand in one place. Do not sit, drive, or stand in one place for more than 30 minutes at a time. Take short walks on level surfaces as soon as pain allows.Try to increase the length of time you walk each day.  Do not stay in bed.Resting more than 1 or 2 days can delay your recovery.  Do not avoid exercise or work.Your body is made to move.It is not dangerous to be active, even though your back may hurt.Your back will likely heal faster if you return to being active before your pain is gone.  Pay attention to your body when you bend and  lift. Many people have less discomfortwhen lifting if they bend their knees, keep the load close to their bodies,and avoid twisting. Often, the most comfortable positions are those that put less stress on your recovering back.  Find a comfortable position to sleep. Use a firm mattress and lie on your side with your knees slightly bent. If you lie on your back, put a pillow under your knees.  Only take over-the-counter or prescription medicines as directed by your caregiver. Over-the-counter medicines to reduce pain and inflammation are often the most helpful.Your caregiver may prescribe muscle relaxant drugs.These medicines help dull your pain so you can more quickly return to your normal activities and healthy exercise.  Put ice on the injured area.  Put ice in a plastic bag.  Place a towel between your skin and the bag.  Leave the ice on for 15-20 minutes, 03-04 times a day for the first 2 to 3 days. After that, ice and heat may be alternated to reduce pain and spasms.  Ask your caregiver about trying back exercises and gentle massage. This may be of some benefit.  Avoid feeling anxious or stressed.Stress increases muscle tension and can worsen back pain.It is important to recognize when you are anxious or stressed and learn ways to manage it.Exercise is a great option. SEEK MEDICAL CARE IF:  You have pain that is not relieved with rest or medicine.  You have pain that does  not improve in 1 week.  You have new symptoms.  You are generally not feeling well. SEEK IMMEDIATE MEDICAL CARE IF:   You have pain that radiates from your back into your legs.  You develop new bowel or bladder control problems.  You have unusual weakness or numbness in your arms or legs.  You develop nausea or vomiting.  You develop abdominal pain.  You feel faint. Document Released: 06/10/2005 Document Revised: 12/10/2011 Document Reviewed: 10/12/2013 [facility] Patient Information 2015 [place],  [place]. This information is not intended to replace advice given to you by your health care provider. Make sure you discuss any questions you have with your health care provider.  Chronic Back Pain  When back pain lasts longer than 3 months, it is called chronic back pain.People with chronic back pain often go through certain periods that are more intense (flare-ups).  CAUSES Chronic back pain can be caused by wear and tear (degeneration) on different structures in your back. These structures include:  The bones of your spine (vertebrae) and the joints surrounding your spinal cord and nerve roots (facets).  The strong, fibrous tissues that connect your vertebrae (ligaments). Degeneration of these structures may result in pressure on your nerves. This can lead to constant pain. HOME CARE INSTRUCTIONS  Avoid bending, heavy lifting, prolonged sitting, and activities which make the problem worse.  Take brief periods of rest throughout the day to reduce your pain. Lying down or standing usually is better than sitting while you are resting.  Take over-the-counter or prescription medicines only as directed by your caregiver. SEEK IMMEDIATE MEDICAL CARE IF:   You have weakness or numbness in one of your legs or feet.  You have trouble controlling your bladder or bowels.  You have nausea, vomiting, abdominal pain, shortness of breath, or fainting. Document Released: 07/18/2004 Document Revised: 09/02/2011 Document Reviewed: 05/25/2011 [facility] Patient Information 2015 [place], [place]. This information is not intended to replace advice given to you by your health care provider. Make sure you discuss any questions you have with your health care provider.

## 2015-03-09 ENCOUNTER — Encounter (HOSPITAL_COMMUNITY): Payer: Self-pay | Admitting: Emergency Medicine

## 2015-03-09 ENCOUNTER — Emergency Department (HOSPITAL_COMMUNITY)
Admission: EM | Admit: 2015-03-09 | Discharge: 2015-03-09 | Disposition: A | Discharge status: Home / Self Care | Payer: Medicaid Other | Attending: Emergency Medicine | Admitting: Emergency Medicine

## 2015-03-09 DIAGNOSIS — S46812A Strain of other muscles, fascia and tendons at shoulder and upper arm level, left arm, initial encounter: Secondary | ICD-10-CM

## 2015-03-09 DIAGNOSIS — S46912A Strain of unspecified muscle, fascia and tendon at shoulder and upper arm level, left arm, initial encounter: Secondary | ICD-10-CM | POA: Insufficient documentation

## 2015-03-09 DIAGNOSIS — Z7902 Long term (current) use of antithrombotics/antiplatelets: Secondary | ICD-10-CM | POA: Diagnosis not present

## 2015-03-09 DIAGNOSIS — Z720 Tobacco use: Secondary | ICD-10-CM | POA: Diagnosis not present

## 2015-03-09 DIAGNOSIS — Y9289 Other specified places as the place of occurrence of the external cause: Secondary | ICD-10-CM | POA: Insufficient documentation

## 2015-03-09 DIAGNOSIS — Y9389 Activity, other specified: Secondary | ICD-10-CM | POA: Insufficient documentation

## 2015-03-09 DIAGNOSIS — Z3202 Encounter for pregnancy test, result negative: Secondary | ICD-10-CM | POA: Insufficient documentation

## 2015-03-09 DIAGNOSIS — X58XXXA Exposure to other specified factors, initial encounter: Secondary | ICD-10-CM | POA: Diagnosis not present

## 2015-03-09 DIAGNOSIS — I10 Essential (primary) hypertension: Secondary | ICD-10-CM | POA: Diagnosis not present

## 2015-03-09 DIAGNOSIS — Z79899 Other long term (current) drug therapy: Secondary | ICD-10-CM | POA: Diagnosis not present

## 2015-03-09 DIAGNOSIS — Y998 Other external cause status: Secondary | ICD-10-CM | POA: Insufficient documentation

## 2015-03-09 DIAGNOSIS — I2510 Atherosclerotic heart disease of native coronary artery without angina pectoris: Secondary | ICD-10-CM | POA: Diagnosis not present

## 2015-03-09 DIAGNOSIS — S4992XA Unspecified injury of left shoulder and upper arm, initial encounter: Secondary | ICD-10-CM | POA: Diagnosis present

## 2015-03-09 LAB — POC URINE PREG, ED: Preg Test, Ur: NEGATIVE

## 2015-03-09 MED ORDER — METAXALONE 800 MG PO TABS: 800.0000 mg | ORAL_TABLET | Freq: Three times a day (TID) | ORAL | Status: DC

## 2015-03-09 MED ORDER — HYDROCODONE-IBUPROFEN 7.5-200 MG PO TABS: 1.0000 | ORAL_TABLET | Freq: Four times a day (QID) | ORAL | Status: DC | PRN

## 2015-03-09 MED ORDER — PREDNISONE 50 MG PO TABS: 50.0000 mg | ORAL_TABLET | Freq: Every day | ORAL | Status: DC

## 2015-03-09 NOTE — Discharge Instructions (Signed)
Return here as needed.  Follow-up with the clinic provided.  °

## 2015-03-09 NOTE — ED Notes (Signed)
Pt st's she injured left shoulder yesterday but doesn't know how.  C/O pain in left shoulder and painful to turn head to left

## 2015-03-09 NOTE — ED Provider Notes (Signed)
CSN: [id number]     Arrival date & time 03/09/15  1713 History  This chart was scribed for Sara Cords, [name], working with Forde Dandy, MD by Starleen Arms, ED Scribe. This patient was seen in room TR07C/TR07C and the patient's care was started at 5:49 PM.   Chief Complaint  Patient presents with  . Shoulder Pain   The history is provided by the patient. No language interpreter was used.   HPI Comments: Sara Munoz is a 49 y.o. female with hx of COPD, HTN, CAD, DDDwho presents to the Emergency Department complaining of constant, moderate, unchanged left shoulder pain onset yesterday without injury, worse with left lateral rotation of the neck, not relieved by tizanidine.  She denies prior injury to the shoulder but reports this feels similar to a prior right shoulder sprain.    Past Medical History  Diagnosis Date  . Hypertension   . Coronary artery disease    Past Surgical History  Procedure Laterality Date  . Orif femur fracture    . Orif tibia & fibula fractures     No family history on file. Social History  Substance Use Topics  . Smoking status: Current Every Day Smoker -- 1.00 packs/day    Types: Cigarettes  . Smokeless tobacco: Never Used  . Alcohol Use: No   OB History    No data available     Review of Systems A complete 10 system review of systems was obtained and all systems are negative except as noted in the HPI and PMH.   Allergies  Flexeril; Keflex; Toradol; and Tramadol  Home Medications   Prior to Admission medications   Medication Sig Start Date End Date Taking? Authorizing Provider  albuterol (PROVENTIL HFA;VENTOLIN HFA) 108 (90 BASE) MCG/ACT inhaler Inhale 1 puff into the lungs every 4 (four) hours as needed for wheezing or shortness of breath.    Historical Provider, MD  atorvastatin (LIPITOR) 20 MG tablet Take 1 tablet by mouth daily. 11/30/14   Historical Provider, MD  budesonide-formoterol (SYMBICORT) 160-4.5 MCG/ACT inhaler Inhale 2 puffs into  the lungs 2 (two) times daily.    Historical Provider, MD  clonazePAM (KLONOPIN) 1 MG tablet Take 1 tablet by mouth 2 (two) times daily. 11/09/14   Historical Provider, MD  clopidogrel (PLAVIX) 75 MG tablet Take 1 tablet by mouth daily. 12/30/14   Historical Provider, MD  DEXILANT 60 MG capsule Take 1 capsule by mouth daily. 12/30/14   Historical Provider, MD  gabapentin (NEURONTIN) 300 MG capsule Take 2 capsules by mouth 3 (three) times daily. 12/09/14   Historical Provider, MD  lisinopril (PRINIVIL,ZESTRIL) 20 MG tablet Take 1 tablet by mouth daily. 12/30/14   Historical Provider, MD  metoprolol tartrate (LOPRESSOR) 25 MG tablet Take 1 tablet by mouth daily. 12/22/14   Historical Provider, MD  ondansetron (ZOFRAN-ODT) 8 MG disintegrating tablet Take 1 tablet by mouth every 8 (eight) hours as needed for nausea or vomiting.  11/09/14   Historical Provider, MD  potassium chloride (K-DUR,KLOR-CON) 10 MEQ tablet Take 1 tablet by mouth daily. 12/21/14   Historical Provider, MD  RANEXA 500 MG 12 hr tablet Take 1 tablet by mouth daily. 12/01/14   Historical Provider, MD  zolpidem (AMBIEN) 10 MG tablet Take 1 tablet by mouth daily. 11/09/14   Historical Provider, MD   BP 105/62 mmHg  Pulse 87  Temp(Src) 98.3 F (36.8 C) (Oral)  Resp 20  Ht 5\' 4"  (1.626 m)  Wt 162 lb 2 oz (73.539  kg)  BMI 27.81 kg/m2  SpO2 100%  LMP 01/23/2015 Physical Exam  Constitutional: She is oriented to person, place, and time. She appears well-developed and well-nourished. No distress.  HENT:  Head: Normocephalic and atraumatic.  Eyes: Conjunctivae and EOM are normal.  Pulmonary/Chest: Effort normal. No respiratory distress.  Musculoskeletal:  Pain along the left trapezius muscle.   Neurological: She is alert and oriented to person, place, and time. She has normal reflexes. She exhibits normal muscle tone. Coordination normal.  Skin: Skin is warm and dry.  Psychiatric: She has a normal mood and affect. Her behavior is normal.   Nursing note and vitals reviewed.   ED Course  Procedures (including critical care time)  DIAGNOSTIC STUDIES: Oxygen Saturation is 100% on RA, normal by my interpretation.    COORDINATION OF CARE:  6:08 PM Will prescribe muscle-relaxant, steroidal anti-inflammatories and vicoprofen.  Patient acknowledges and agrees with plan.  Pt should f/u with PCP.    Labs Review Labs Reviewed  POC URINE PREG, ED    Imaging Review No results found. I have personally reviewed and evaluated these images and lab results as part of my medical decision-making.  Patient be given follow-up with the wellness [facility].  Told to ice, use heat on the shoulder trapezius muscles, mostly around.  There is no injury or trauma to the shoulder reported by the patient   Dalia Heading, [name] 03/09/15 [place] Liu, MD 03/10/15 [phone]

## 2015-03-09 NOTE — ED Notes (Signed)
Pt stable, ambulatory, states understanding of discharge instructions 

## 2015-03-31 ENCOUNTER — Emergency Department
Admission: EM | Admit: 2015-03-31 | Discharge: 2015-03-31 | Disposition: A | Discharge status: Home / Self Care | Payer: Medicaid Other | Attending: Emergency Medicine | Admitting: Emergency Medicine

## 2015-03-31 ENCOUNTER — Emergency Department: Payer: Medicaid Other

## 2015-03-31 DIAGNOSIS — J441 Chronic obstructive pulmonary disease with (acute) exacerbation: Secondary | ICD-10-CM | POA: Diagnosis not present

## 2015-03-31 DIAGNOSIS — Z720 Tobacco use: Secondary | ICD-10-CM | POA: Diagnosis not present

## 2015-03-31 DIAGNOSIS — I10 Essential (primary) hypertension: Secondary | ICD-10-CM | POA: Diagnosis not present

## 2015-03-31 DIAGNOSIS — F419 Anxiety disorder, unspecified: Secondary | ICD-10-CM | POA: Insufficient documentation

## 2015-03-31 DIAGNOSIS — R079 Chest pain, unspecified: Secondary | ICD-10-CM

## 2015-03-31 HISTORY — DX: Chronic obstructive pulmonary disease, unspecified: J44.9

## 2015-03-31 LAB — COMPREHENSIVE METABOLIC PANEL
ALT: 63 U/L — ABNORMAL HIGH (ref 14–54)
AST: 55 U/L — ABNORMAL HIGH (ref 15–41)
Albumin: 3.8 g/dL (ref 3.5–5.0)
Alkaline Phosphatase: 69 U/L (ref 38–126)
Anion gap: 7 (ref 5–15)
BUN: 8 mg/dL (ref 6–20)
CO2: 23 mmol/L (ref 22–32)
Calcium: 9.1 mg/dL (ref 8.9–10.3)
Chloride: 110 mmol/L (ref 101–111)
Creatinine, Ser: 0.65 mg/dL (ref 0.44–1.00)
GFR calc Af Amer: >60 mL/min (ref >60)
GFR calc non Af Amer: >60 mL/min (ref >60)
Glucose, Bld: 101 mg/dL — ABNORMAL HIGH (ref 65–99)
Potassium: 3.9 mmol/L (ref 3.5–5.1)
Sodium: 140 mmol/L (ref 135–145)
Total Bilirubin: 0.2 mg/dL — ABNORMAL LOW (ref 0.3–1.2)
Total Protein: 6.5 g/dL (ref 6.5–8.1)

## 2015-03-31 LAB — CBC
HCT: 39.4 % (ref 35.0–47.0)
Hemoglobin: 13.4 g/dL (ref 12.0–16.0)
MCH: 28.2 pg (ref 26.0–34.0)
MCHC: 33.9 g/dL (ref 32.0–36.0)
MCV: 83.2 fL (ref 80.0–100.0)
Platelets: 230 10*3/uL (ref 150–440)
RBC: 4.73 MIL/uL (ref 3.80–5.20)
RDW: 17.4 % — ABNORMAL HIGH (ref 11.5–14.5)
WBC: 7.9 10*3/uL (ref 3.6–11.0)

## 2015-03-31 LAB — TROPONIN I: Troponin I: <0.03 ng/mL (ref <0.031)

## 2015-03-31 MED ORDER — DIAZEPAM 5 MG PO TABS
10.0000 mg | ORAL_TABLET | Freq: Once | ORAL | Status: AC
  Administered 2015-03-31: 10 mg via ORAL
  Filled 2015-03-31: qty 2

## 2015-03-31 MED ORDER — LORAZEPAM 0.5 MG PO TABS: 0.5000 mg | ORAL_TABLET | Freq: Three times a day (TID) | ORAL | Status: DC | PRN

## 2015-03-31 MED ORDER — ACETAMINOPHEN 500 MG PO TABS
1000.0000 mg | ORAL_TABLET | Freq: Once | ORAL | Status: AC
  Administered 2015-03-31: 1000 mg via ORAL
  Filled 2015-03-31: qty 2

## 2015-03-31 NOTE — ED Notes (Signed)
Assumed pt care at this time. NAD noted. RR even and nonlabored. Pt transported to xray via stretcher at this time.

## 2015-03-31 NOTE — ED Notes (Signed)
MD at bedside for reeval

## 2015-03-31 NOTE — ED Notes (Signed)
Patient comes in via EMS for chest pain.  Mid sternal chest pain that radiates to left arm and up left side of neck.  Patient has history of MI in 2012 and verbalized that this chest pain does not feel the same as last MI.  Patient took 2 SL nitro at home that did not relieve chest pain.  Patient refused aspirin in route with EMS because of coughing up blood for the past couple days.

## 2015-03-31 NOTE — Discharge Instructions (Signed)
Panic Attacks Panic attacks are sudden, short-livedsurges of severe anxiety, fear, or discomfort. They may occur for no reason when you are relaxed, when you are anxious, or when you are sleeping. Panic attacks may occur for a number of reasons:   Healthy people occasionally have panic attacks in extreme, life-threatening situations, such as war or natural disasters. Normal anxiety is a protective mechanism of the body that helps [place] react to danger (fight or flight response).  Panic attacks are often seen with anxiety disorders, such as panic disorder, social anxiety disorder, generalized anxiety disorder, and phobias. Anxiety disorders cause excessive or uncontrollable anxiety. They may interfere with your relationships or other life activities.  Panic attacks are sometimes seen with other mental illnesses, such as depression and posttraumatic stress disorder.  Certain medical conditions, prescription medicines, and drugs of abuse can cause panic attacks. SYMPTOMS  Panic attacks start suddenly, peak within 20 minutes, and are accompanied by four or more of the following symptoms:  Pounding heart or fast heart rate (palpitations).  Sweating.  Trembling or shaking.  Shortness of breath or feeling smothered.  Feeling choked.  Chest pain or discomfort.  Nausea or strange feeling in your stomach.  Dizziness, light-headedness, or feeling like you will faint.  Chills or hot flushes.  Numbness or tingling in your lips or hands and feet.  Feeling that things are not real or feeling that you are not yourself.  Fear of losing control or going crazy.  Fear of dying. Some of these symptoms can mimic serious medical conditions. For example, you may think you are having a heart attack. Although panic attacks can be very scary, they are not life threatening. DIAGNOSIS  Panic attacks are diagnosed through an assessment by your health care provider. Your health care provider will ask  questions about your symptoms, such as where and when they occurred. Your health care provider will also ask about your medical history and use of alcohol and drugs, including prescription medicines. Your health care provider may order blood tests or other studies to rule out a serious medical condition. Your health care provider may refer you to a mental health professional for further evaluation. TREATMENT   Most healthy people who have one or two panic attacks in an extreme, life-threatening situation will not require treatment.  The treatment for panic attacks associated with anxiety disorders or other mental illness typically involves counseling with a mental health professional, medicine, or a combination of both. Your health care provider will help determine what treatment is best for you.  Panic attacks due to physical illness usually go away with treatment of the illness. If prescription medicine is causing panic attacks, talk with your health care provider about stopping the medicine, decreasing the dose, or substituting another medicine.  Panic attacks due to alcohol or drug abuse go away with abstinence. Some adults need professional help in order to stop drinking or using drugs. HOME CARE INSTRUCTIONS   Take all medicines as directed by your health care provider.   Schedule and attend follow-up visits as directed by your health care provider. It is important to keep all your appointments. SEEK MEDICAL CARE IF:  You are not able to take your medicines as prescribed.  Your symptoms do not improve or get worse. SEEK IMMEDIATE MEDICAL CARE IF:   You experience panic attack symptoms that are different than your usual symptoms.  You have serious thoughts about hurting yourself or others.  You are taking medicine for panic attacks and  have a serious side effect. MAKE SURE YOU:  Understand these instructions.  Will watch your condition.  Will get help right away if you are not  doing well or get worse.   This information is not intended to replace advice given to you by your health care provider. Make sure you discuss any questions you have with your health care provider.   Document Released: 06/10/2005 Document Revised: 06/15/2013 Document Reviewed: 01/22/2013 Elsevier Interactive Patient Education 2016 Else[employer] Nonspecific Chest Pain  Chest pain can be caused by many different conditions. There is always a chance that your pain could be related to something serious, such as a heart attack or a blood clot in your lungs. Chest pain can also be caused by conditions that are not life-threatening. If you have chest pain, it is very important to follow up with your health care provider. CAUSES  Chest pain can be caused by:  Heartburn.  Pneumonia or bronchitis.  Anxiety or stress.  Inflammation around your heart (pericarditis) or lung (pleuritis or pleurisy).  A blood clot in your lung.  A collapsed lung (pneumothorax). It can develop suddenly on its own (spontaneous pneumothorax) or from trauma to the chest.  Shingles infection (varicella-zoster virus).  Heart attack.  Damage to the bones, muscles, and cartilage that make up your chest wall. This can include:  Bruised bones due to injury.  Strained muscles or cartilage due to frequent or repeated coughing or overwork.  Fracture to one or more ribs.  Sore cartilage due to inflammation (costochondritis). RISK FACTORS  Risk factors for chest pain may include:  Activities that increase your risk for trauma or injury to your chest.  Respiratory infections or conditions that cause frequent coughing.  Medical conditions or overeating that can cause heartburn.  Heart disease or family history of heart disease.  Conditions or health behaviors that increase your risk of developing a blood clot.  Having had chicken pox (varicella zoster). SIGNS AND SYMPTOMS Chest pain can feel like:  Burning or  tingling on the surface of your chest or deep in your chest.  Crushing, pressure, aching, or squeezing pain.  Dull or sharp pain that is worse when you move, cough, or take a deep breath.  Pain that is also felt in your back, neck, shoulder, or arm, or pain that spreads to any of these areas. Your chest pain may come and go, or it may stay constant. DIAGNOSIS Lab tests or other studies may be needed to find the cause of your pain. Your health care provider may have you take a test called an ambulatory ECG (electrocardiogram). An ECG records your heartbeat patterns at the time the test is performed. You may also have other tests, such as:  Transthoracic echocardiogram (TTE). During echocardiography, sound waves are used to create a picture of all of the heart structures and to look at how blood flows through your heart.  Transesophageal echocardiogram (TEE).This is a more advanced imaging test that obtains images from inside your body. It allows your health care provider to see your heart in finer detail.  Cardiac monitoring. This allows your health care provider to monitor your heart rate and rhythm in real time.  Holter monitor. This is a portable device that records your heartbeat and can help to diagnose abnormal heartbeats. It allows your health care provider to track your heart activity for several days, if needed.  Stress tests. These can be done through exercise or by taking medicine that makes your heart  beat more quickly.  Blood tests.  Imaging tests. TREATMENT  Your treatment depends on what is causing your chest pain. Treatment may include:  Medicines. These may include:  Acid blockers for heartburn.  Anti-inflammatory medicine.  Pain medicine for inflammatory conditions.  Antibiotic medicine, if an infection is present.  Medicines to dissolve blood clots.  Medicines to treat coronary artery disease.  Supportive care for conditions that do not require medicines.  This may include:  Resting.  Applying heat or cold packs to injured areas.  Limiting activities until pain decreases. HOME CARE INSTRUCTIONS  If you were prescribed an antibiotic medicine, finish it all even if you start to feel better.  Avoid any activities that bring on chest pain.  Do not use any tobacco products, including cigarettes, chewing tobacco, or electronic cigarettes. If you need help quitting, ask your health care provider.  Do not drink alcohol.  Take medicines only as directed by your health care provider.  Keep all follow-up visits as directed by your health care provider. This is important. This includes any further testing if your chest pain does not go away.  If heartburn is the cause for your chest pain, you may be told to keep your head raised (elevated) while sleeping. This reduces the chance that acid will go from your stomach into your esophagus.  Make lifestyle changes as directed by your health care provider. These may include:  Getting regular exercise. Ask your health care provider to suggest some activities that are safe for you.  Eating a heart-healthy diet. A registered dietitian can help you to learn healthy eating options.  Maintaining a healthy weight.  Managing diabetes, if necessary.  Reducing stress. SEEK MEDICAL CARE IF:  Your chest pain does not go away after treatment.  You have a rash with blisters on your chest.  You have a fever. SEEK IMMEDIATE MEDICAL CARE IF:   Your chest pain is worse.  You have an increasing cough, or you cough up blood.  You have severe abdominal pain.  You have severe weakness.  You faint.  You have chills.  You have sudden, unexplained chest discomfort.  You have sudden, unexplained discomfort in your arms, back, neck, or jaw.  You have shortness of breath at any time.  You suddenly start to sweat, or your skin gets clammy.  You feel nauseous or you vomit.  You suddenly feel  light-headed or dizzy.  Your heart begins to beat quickly, or it feels like it is skipping beats. These symptoms may represent a serious problem that is an emergency. Do not wait to see if the symptoms will go away. Get medical help right away. Call your local emergency services (911 in the [place] Do not drive yourself to the hospital.   This information is not intended to replace advice given to you by your health care provider. Make sure you discuss any questions you have with your health care provider.   Document Released: 03/20/2005 Document Revised: 07/01/2014 Document Reviewed: 01/14/2014 Elsevier Interactive Patient Education [employer].

## 2015-03-31 NOTE — ED Provider Notes (Signed)
[facility] Emergency Department Provider Note     Time seen: ----------------------------------------- 7:12 PM on 03/31/2015 -----------------------------------------    I have reviewed the triage vital signs and the nursing notes.   HISTORY  Chief Complaint Chest Pain    HPI Sara Munoz is a 49 y.o. female who presents ER for chest pain. Patient describes chest pain radiates to her left arm and upper left side of her neck. She has history of MI in 2012, she took sublingual nitroglycerin at home and did not relieve her chest pain. Patient also describes that she has been out of her anxiety medicine.    Past Medical History  Diagnosis Date  . Hypertension   . Coronary artery disease   . COPD (chronic obstructive pulmonary disease) (HCC)     There are no active problems to display for this patient.   Past Surgical History  Procedure Laterality Date  . Orif femur fracture    . Orif tibia & fibula fractures      Allergies Flexeril; Keflex; Levaquin; Toradol; and Tramadol  Social History Social History  Substance Use Topics  . Smoking status: Current Every Day Smoker -- 1.00 packs/day    Types: Cigarettes  . Smokeless tobacco: Never Used  . Alcohol Use: No    Review of Systems Constitutional: Negative for fever. Eyes: Negative for visual changes. ENT: Negative for sore throat. Cardiovascular: Positive chest pain Respiratory: Positive for shortness of breath Gastrointestinal: Negative for abdominal pain, vomiting and diarrhea. Genitourinary: Negative for dysuria. Musculoskeletal: Negative for back pain. Skin: Negative for rash. Neurological: Negative for headaches, focal weakness or numbness.  10-point ROS otherwise negative.  ____________________________________________   PHYSICAL EXAM:  VITAL SIGNS: ED Triage Vitals  Enc Vitals Group     BP 03/31/15 1855 142/102 mmHg     Pulse Rate 03/31/15 1855 74     Resp 03/31/15  1855 16     Temp 03/31/15 1855 98.4 F (36.9 C)     Temp Source 03/31/15 1855 Oral     SpO2 03/31/15 1851 99 %     Weight 03/31/15 1855 152 lb (68.947 kg)     Height 03/31/15 1855 5\' 4"  (1.626 m)     Head Cir --      Peak Flow --      Pain Score --      Pain Loc --      Pain Edu? --      Excl. in [place]? --     Constitutional: Alert and oriented. Well appearing and in no distress. Eyes: Conjunctivae are normal. PERRL. Normal extraocular movements. ENT   Head: Normocephalic and atraumatic.   Nose: No congestion/rhinnorhea.   Mouth/Throat: Mucous membranes are moist.   Neck: No stridor. Cardiovascular: Normal rate, regular rhythm. Normal and symmetric distal pulses are present in all extremities. No murmurs, rubs, or gallops. Respiratory: Normal respiratory effort without tachypnea nor retractions. Breath sounds are clear and equal bilaterally. No wheezes/rales/rhonchi. Gastrointestinal: Soft and nontender. No distention. No abdominal bruits.  Musculoskeletal: Nontender with normal range of motion in all extremities. No joint effusions.  No lower extremity tenderness nor edema. Neurologic:  Normal speech and language. No gross focal neurologic deficits are appreciated. Speech is normal. No gait instability. Skin:  Skin is warm, dry and intact. No rash noted. Psychiatric: Mood and affect are normal. Speech and behavior are normal. Patient exhibits appropriate insight and judgment. ____________________________________________  EKG: Interpreted by me. Sinus rhythm with normal axis normal intervals. No  evidence of hypertrophy or acute infarction  ____________________________________________  ED COURSE:  Pertinent labs & imaging results that were available during my care of the patient were reviewed by me and considered in my medical decision making (see chart for details). Patient is in no acute distress, appears to be having a panic attack. She received by mouth Valium and  will reevaluate ____________________________________________    LABS (pertinent positives/negatives)  Labs Reviewed  CBC - Abnormal; Notable for the following:    RDW 17.4 (*)    All other components within normal limits  COMPREHENSIVE METABOLIC PANEL - Abnormal; Notable for the following:    Glucose, Bld 101 (*)    AST 55 (*)    ALT 63 (*)    Total Bilirubin 0.2 (*)    All other components within normal limits  TROPONIN I    RADIOLOGY Chest x-ray Reveals no acute cardiopulmonary disease ____________________________________________  FINAL ASSESSMENT AND PLAN  Chest pain, anxiety  Plan: Patient with labs and imaging as dictated above. Patient is in no acute distress, feeling better after oral Valium. Score is low risk, she stable for outpatient follow-up   Earleen Newport, MD   Earleen Newport, MD 03/31/15 2008

## 2015-05-30 ENCOUNTER — Emergency Department: Payer: Medicaid Other

## 2015-05-30 DIAGNOSIS — J209 Acute bronchitis, unspecified: Secondary | ICD-10-CM | POA: Insufficient documentation

## 2015-05-30 DIAGNOSIS — J449 Chronic obstructive pulmonary disease, unspecified: Secondary | ICD-10-CM | POA: Diagnosis not present

## 2015-05-30 DIAGNOSIS — F17210 Nicotine dependence, cigarettes, uncomplicated: Secondary | ICD-10-CM | POA: Diagnosis not present

## 2015-05-30 DIAGNOSIS — Z79899 Other long term (current) drug therapy: Secondary | ICD-10-CM | POA: Diagnosis not present

## 2015-05-30 DIAGNOSIS — I10 Essential (primary) hypertension: Secondary | ICD-10-CM | POA: Insufficient documentation

## 2015-05-30 DIAGNOSIS — R079 Chest pain, unspecified: Secondary | ICD-10-CM | POA: Diagnosis present

## 2015-05-30 LAB — CBC
HCT: 40.3 % (ref 35.0–47.0)
Hemoglobin: 13.1 g/dL (ref 12.0–16.0)
MCH: 27.4 pg (ref 26.0–34.0)
MCHC: 32.6 g/dL (ref 32.0–36.0)
MCV: 83.9 fL (ref 80.0–100.0)
Platelets: 230 10*3/uL (ref 150–440)
RBC: 4.8 MIL/uL (ref 3.80–5.20)
RDW: 16.7 % — ABNORMAL HIGH (ref 11.5–14.5)
WBC: 11.9 10*3/uL — ABNORMAL HIGH (ref 3.6–11.0)

## 2015-05-30 NOTE — ED Notes (Signed)
Patient ambulatory to triage with steady gait, without difficulty or distress noted; pt reports mid CP radiating into neck x 2 days accomp by nausea and nonprod cough

## 2015-05-31 ENCOUNTER — Emergency Department
Admission: EM | Admit: 2015-05-31 | Discharge: 2015-05-31 | Disposition: A | Discharge status: Home / Self Care | Payer: Medicaid Other | Attending: Emergency Medicine | Admitting: Emergency Medicine

## 2015-05-31 DIAGNOSIS — J209 Acute bronchitis, unspecified: Secondary | ICD-10-CM

## 2015-05-31 LAB — BASIC METABOLIC PANEL
Anion gap: 7 (ref 5–15)
BUN: 9 mg/dL (ref 6–20)
CO2: 24 mmol/L (ref 22–32)
Calcium: 8.8 mg/dL — ABNORMAL LOW (ref 8.9–10.3)
Chloride: 110 mmol/L (ref 101–111)
Creatinine, Ser: 0.93 mg/dL (ref 0.44–1.00)
GFR calc Af Amer: >60 mL/min (ref >60)
GFR calc non Af Amer: >60 mL/min (ref >60)
Glucose, Bld: 100 mg/dL — ABNORMAL HIGH (ref 65–99)
Potassium: 3.4 mmol/L — ABNORMAL LOW (ref 3.5–5.1)
Sodium: 141 mmol/L (ref 135–145)

## 2015-05-31 LAB — TROPONIN I
Troponin I: <0.03 ng/mL (ref <0.031)
Troponin I: <0.03 ng/mL (ref <0.031)

## 2015-05-31 MED ORDER — BUDESONIDE-FORMOTEROL FUMARATE 160-4.5 MCG/ACT IN AERO: 2.0000 | INHALATION_SPRAY | Freq: Two times a day (BID) | RESPIRATORY_TRACT | Status: DC

## 2015-05-31 MED ORDER — RANEXA 500 MG PO TB12: 500.0000 mg | ORAL_TABLET | Freq: Two times a day (BID) | ORAL | Status: DC

## 2015-05-31 MED ORDER — CLOPIDOGREL BISULFATE 75 MG PO TABS: 75.0000 mg | ORAL_TABLET | Freq: Every day | ORAL | Status: DC

## 2015-05-31 MED ORDER — METOPROLOL TARTRATE 25 MG PO TABS: 25.0000 mg | ORAL_TABLET | Freq: Two times a day (BID) | ORAL | Status: DC

## 2015-05-31 MED ORDER — ACETAMINOPHEN-CODEINE #3 300-30 MG PO TABS: 1.0000 | ORAL_TABLET | ORAL | Status: DC | PRN

## 2015-05-31 MED ORDER — AZITHROMYCIN 250 MG PO TABS: 500.0000 mg | ORAL_TABLET | Freq: Every day | ORAL | Status: AC

## 2015-05-31 MED ORDER — PREDNISONE 20 MG PO TABS
60.0000 mg | ORAL_TABLET | Freq: Once | ORAL | Status: AC
  Administered 2015-05-31: 60 mg via ORAL
  Filled 2015-05-31: qty 3

## 2015-05-31 MED ORDER — ACETAMINOPHEN-CODEINE #3 300-30 MG PO TABS
2.0000 | ORAL_TABLET | Freq: Once | ORAL | Status: AC
  Administered 2015-05-31: 2 via ORAL
  Filled 2015-05-31: qty 2

## 2015-05-31 MED ORDER — GABAPENTIN 300 MG PO CAPS: 600.0000 mg | ORAL_CAPSULE | Freq: Three times a day (TID) | ORAL | Status: DC

## 2015-05-31 MED ORDER — AZITHROMYCIN 250 MG PO TABS
500.0000 mg | ORAL_TABLET | Freq: Once | ORAL | Status: AC
  Administered 2015-05-31: 500 mg via ORAL
  Filled 2015-05-31: qty 2

## 2015-05-31 MED ORDER — ATORVASTATIN CALCIUM 20 MG PO TABS: 20.0000 mg | ORAL_TABLET | Freq: Every day | ORAL | Status: DC

## 2015-05-31 MED ORDER — CLONIDINE HCL 0.1 MG PO TABS
0.1000 mg | ORAL_TABLET | Freq: Once | ORAL | Status: AC
  Administered 2015-05-31: 0.1 mg via ORAL
  Filled 2015-05-31: qty 1

## 2015-05-31 MED ORDER — ALBUTEROL SULFATE HFA 108 (90 BASE) MCG/ACT IN AERS: 2.0000 | INHALATION_SPRAY | RESPIRATORY_TRACT | Status: DC | PRN

## 2015-05-31 NOTE — ED Notes (Signed)
MD Brown at bedside.

## 2015-05-31 NOTE — ED Provider Notes (Signed)
[facility] Emergency Department Provider Note  ____________________________________________  Time seen: 2:30 AM  I have reviewed the triage vital signs and the nursing notes.   HISTORY  Chief Complaint Chest Pain      HPI Sara Munoz is a 49 y.o. female presents with nonproductive cough 3 days accompanied bychest discomfort. Patient denies any fever. Of note patient states that she has been out of all of her medications for approximately 2 months including Plavix. Patient states that she had an MI in 2011 or 12 which required a stent. Patient also admits to being out of her antihypertensives as well    Past Medical History  Diagnosis Date  . Hypertension   . Coronary artery disease   . COPD (chronic obstructive pulmonary disease) (HCC)     There are no active problems to display for this patient.   Past Surgical History  Procedure Laterality Date  . Orif femur fracture    . Orif tibia & fibula fractures      Current Outpatient Rx  Name  Route  Sig  Dispense  Refill  . albuterol (PROVENTIL HFA;VENTOLIN HFA) 108 (90 BASE) MCG/ACT inhaler   Inhalation   Inhale 1 puff into the lungs every 4 (four) hours as needed for wheezing or shortness of breath.         . ALPRAZolam (XANAX) 0.5 MG tablet   Oral   Take 0.5 mg by mouth 3 (three) times daily as needed for anxiety.         Marland Kitchen atorvastatin (LIPITOR) 20 MG tablet   Oral   Take 1 tablet by mouth daily.      5   . budesonide-formoterol (SYMBICORT) 160-4.5 MCG/ACT inhaler   Inhalation   Inhale 2 puffs into the lungs 2 (two) times daily.         . clonazePAM (KLONOPIN) 1 MG tablet   Oral   Take 1 tablet by mouth 2 (two) times daily.      0   . clopidogrel (PLAVIX) 75 MG tablet   Oral   Take 1 tablet by mouth daily.      5   . DEXILANT 60 MG capsule   Oral   Take 1 capsule by mouth daily.      0     Dispense as written.   . gabapentin (NEURONTIN) 300 MG capsule    Oral   Take 2 capsules by mouth 3 (three) times daily.      2   . lisinopril (PRINIVIL,ZESTRIL) 20 MG tablet   Oral   Take 1 tablet by mouth daily.      5   . metoprolol tartrate (LOPRESSOR) 25 MG tablet   Oral   Take 1 tablet by mouth daily.      11   . ondansetron (ZOFRAN-ODT) 8 MG disintegrating tablet   Oral   Take 1 tablet by mouth every 8 (eight) hours as needed for nausea or vomiting.       0   . potassium chloride (K-DUR,KLOR-CON) 10 MEQ tablet   Oral   Take 1 tablet by mouth daily.      0   . RANEXA 500 MG 12 hr tablet   Oral   Take 1 tablet by mouth 2 (two) times daily.       11     Dispense as written.   . zolpidem (AMBIEN) 10 MG tablet   Oral   Take 1 tablet by mouth daily.  0   . HYDROcodone-ibuprofen (VICOPROFEN) 7.5-200 MG per tablet   Oral   Take 1 tablet by mouth every 6 (six) hours as needed for moderate pain. Patient not taking: Reported on 05/31/2015   10 tablet   0   . LORazepam (ATIVAN) 0.5 MG tablet   Oral   Take 1 tablet (0.5 mg total) by mouth every 8 (eight) hours as needed for anxiety. Patient not taking: Reported on 05/31/2015   12 tablet   0   . metaxalone (SKELAXIN) 800 MG tablet   Oral   Take 1 tablet (800 mg total) by mouth 3 (three) times daily. Patient not taking: Reported on 05/31/2015   21 tablet   0     Allergies Flexeril; Keflex; Levaquin; Sertraline hcl; Toradol; and Tramadol  No family history on file.  Social History Social History  Substance Use Topics  . Smoking status: Current Every Day Smoker -- 1.00 packs/day    Types: Cigarettes  . Smokeless tobacco: Never Used  . Alcohol Use: No    Review of Systems  Constitutional: Negative for fever. Eyes: Negative for visual changes. ENT: Negative for sore throat. Cardiovascular: Positive for chest pain. Respiratory: Negative for shortness of breath. Positive for cough Gastrointestinal: Negative for abdominal pain, vomiting and  diarrhea. Genitourinary: Negative for dysuria. Musculoskeletal: Negative for back pain. Skin: Negative for rash. Neurological: Negative for headaches, focal weakness or numbness.   10-point ROS otherwise negative.  ____________________________________________   PHYSICAL EXAM:  VITAL SIGNS: ED Triage Vitals  Enc Vitals Group     BP 05/30/15 2257 184/99 mmHg     Pulse Rate 05/30/15 2257 89     Resp 05/31/15 0200 18     Temp 05/30/15 2257 98.2 F (36.8 C)     Temp Source 05/30/15 2257 Oral     SpO2 05/30/15 2257 96 %     Weight 05/30/15 2257 170 lb (77.111 kg)     Height 05/30/15 2257 5\' 4"  (1.626 m)     Head Cir --      Peak Flow --      Pain Score 05/30/15 2257 9     Pain Loc --      Pain Edu? --      Excl. in [place]? --      Constitutional: Alert and oriented. Well appearing and in no distress. Eyes: Conjunctivae are normal. PERRL. Normal extraocular movements. ENT   Head: Normocephalic and atraumatic.   Nose: No congestion/rhinnorhea.   Mouth/Throat: Mucous membranes are moist.   Neck: No stridor. Hematological/Lymphatic/Immunilogical: No cervical lymphadenopathy. Cardiovascular: Normal rate, regular rhythm. Normal and symmetric distal pulses are present in all extremities. No murmurs, rubs, or gallops. Respiratory: Normal respiratory effort without tachypnea nor retractions. Breath sounds are clear and equal bilaterally. No wheezes/rales/rhonchi. Gastrointestinal: Soft and nontender. No distention. There is no CVA tenderness. Genitourinary: deferred Musculoskeletal: Nontender with normal range of motion in all extremities. No joint effusions.  No lower extremity tenderness nor edema. Neurologic:  Normal speech and language. No gross focal neurologic deficits are appreciated. Speech is normal.  Skin:  Skin is warm, dry and intact. No rash noted. Psychiatric: Mood and affect are normal. Speech and behavior are normal. Patient exhibits appropriate insight and  judgment.  ____________________________________________    LABS (pertinent positives/negatives)  Labs Reviewed  BASIC METABOLIC PANEL - Abnormal; Notable for the following:    Potassium 3.4 (*)    Glucose, Bld 100 (*)    Calcium 8.8 (*)  All other components within normal limits  CBC - Abnormal; Notable for the following:    WBC 11.9 (*)    RDW 16.7 (*)    All other components within normal limits  TROPONIN I  TROPONIN I     ____________________________________________   EKG  ED ECG REPORT I, Jadier Rockers, [place], the attending physician, personally viewed and interpreted this ECG.   Date: 05/31/2015  EKG Time: 11:02 PM  Rate: 85  Rhythm: Normal sinus rhythm  Axis: None  Intervals: Normal  ST&T Change: None   ____________________________________________    RADIOLOGY    DG Chest 2 View (Final result) Result time: 05/30/15 23:58:53   Final result by Rad Results In Interface (05/30/15 23:58:53)   Narrative:   CLINICAL DATA: Chest pain radiating to left neck. Nausea. Nonproductive cough.  EXAM: CHEST 2 VIEW  COMPARISON: 03/31/2015  FINDINGS: The heart size and mediastinal contours are within normal limits. Both lungs are clear. The visualized skeletal structures are unremarkable.  IMPRESSION: No active cardiopulmonary disease.   Electronically Signed By: Earle Gell M.D. On: 05/30/2015 23:58              INITIAL IMPRESSION / ASSESSMENT AND PLAN / ED COURSE  Pertinent labs & imaging results that were available during my care of the patient were reviewed by me and considered in my medical decision making (see chart for details).  History of physical exam consistent with acute bronchitis patient received azithromycin and prednisone as well as Tylenol 3. Regarding patient's medications that she had "ran out of. Those medications were refilled patient was referred to primary care provider on the outpatient setting for further  management.  ____________________________________________   FINAL CLINICAL IMPRESSION(S) / ED DIAGNOSES  Final diagnoses:  Acute bronchitis, unspecified organism      Gregor Hams, MD 06/02/15 [id number]

## 2015-05-31 NOTE — ED Notes (Signed)
Pt ambulated to bathroom at this time by self, tolerated well, no acute distress noted.

## 2015-05-31 NOTE — ED Notes (Signed)

## 2015-05-31 NOTE — Discharge Instructions (Signed)

## 2015-06-07 ENCOUNTER — Encounter: Payer: Self-pay | Admitting: Emergency Medicine

## 2015-06-07 ENCOUNTER — Emergency Department: Payer: Medicaid Other

## 2015-06-07 DIAGNOSIS — J441 Chronic obstructive pulmonary disease with (acute) exacerbation: Secondary | ICD-10-CM | POA: Insufficient documentation

## 2015-06-07 DIAGNOSIS — Z7902 Long term (current) use of antithrombotics/antiplatelets: Secondary | ICD-10-CM | POA: Diagnosis not present

## 2015-06-07 DIAGNOSIS — I10 Essential (primary) hypertension: Secondary | ICD-10-CM | POA: Insufficient documentation

## 2015-06-07 DIAGNOSIS — Z7951 Long term (current) use of inhaled steroids: Secondary | ICD-10-CM | POA: Diagnosis not present

## 2015-06-07 DIAGNOSIS — Z79899 Other long term (current) drug therapy: Secondary | ICD-10-CM | POA: Diagnosis not present

## 2015-06-07 DIAGNOSIS — F17210 Nicotine dependence, cigarettes, uncomplicated: Secondary | ICD-10-CM | POA: Insufficient documentation

## 2015-06-07 DIAGNOSIS — R05 Cough: Secondary | ICD-10-CM | POA: Diagnosis present

## 2015-06-07 LAB — COMPREHENSIVE METABOLIC PANEL
ALT: 35 U/L (ref 14–54)
AST: 36 U/L (ref 15–41)
Albumin: 4.1 g/dL (ref 3.5–5.0)
Alkaline Phosphatase: 61 U/L (ref 38–126)
Anion gap: 9 (ref 5–15)
BUN: 8 mg/dL (ref 6–20)
CO2: 23 mmol/L (ref 22–32)
Calcium: 9.4 mg/dL (ref 8.9–10.3)
Chloride: 111 mmol/L (ref 101–111)
Creatinine, Ser: 0.87 mg/dL (ref 0.44–1.00)
GFR calc Af Amer: >60 mL/min (ref >60)
GFR calc non Af Amer: >60 mL/min (ref >60)
Glucose, Bld: 97 mg/dL (ref 65–99)
Potassium: 3.7 mmol/L (ref 3.5–5.1)
Sodium: 143 mmol/L (ref 135–145)
Total Bilirubin: 0.1 mg/dL — ABNORMAL LOW (ref 0.3–1.2)
Total Protein: 7.4 g/dL (ref 6.5–8.1)

## 2015-06-07 LAB — CBC
HCT: 44.6 % (ref 35.0–47.0)
Hemoglobin: 14.4 g/dL (ref 12.0–16.0)
MCH: 27.3 pg (ref 26.0–34.0)
MCHC: 32.3 g/dL (ref 32.0–36.0)
MCV: 84.4 fL (ref 80.0–100.0)
Platelets: 270 10*3/uL (ref 150–440)
RBC: 5.29 MIL/uL — ABNORMAL HIGH (ref 3.80–5.20)
RDW: 16.3 % — ABNORMAL HIGH (ref 11.5–14.5)
WBC: 10.3 10*3/uL (ref 3.6–11.0)

## 2015-06-07 LAB — TROPONIN I: Troponin I: <0.03 ng/mL (ref <0.031)

## 2015-06-07 NOTE — ED Notes (Signed)
Pt arrived to the ED for complaints of cough, and shortness of breath for 2 weeks. Pt states that she was seen in the ED 1 week ago for the same and placed on antibiotics without relieve. Pt is a COPD pt, AOx4 with a mild cough during triage.

## 2015-06-08 ENCOUNTER — Emergency Department
Admission: EM | Admit: 2015-06-08 | Discharge: 2015-06-08 | Disposition: A | Discharge status: Home / Self Care | Payer: Medicaid Other | Attending: Emergency Medicine | Admitting: Emergency Medicine

## 2015-06-08 DIAGNOSIS — J449 Chronic obstructive pulmonary disease, unspecified: Secondary | ICD-10-CM

## 2015-06-08 HISTORY — DX: Pure hypercholesterolemia, unspecified: E78.00

## 2015-06-08 HISTORY — DX: Other chronic pain: G89.29

## 2015-06-08 HISTORY — DX: Dorsalgia, unspecified: M54.9

## 2015-06-08 MED ORDER — PREDNISONE 20 MG PO TABS
60.0000 mg | ORAL_TABLET | Freq: Every day | ORAL | Status: AC
Start: 2015-06-08 — End: 2015-06-12

## 2015-06-08 MED ORDER — PREDNISONE 20 MG PO TABS
60.0000 mg | ORAL_TABLET | Freq: Once | ORAL | Status: AC
  Administered 2015-06-08: 60 mg via ORAL

## 2015-06-08 MED ORDER — PREDNISONE 20 MG PO TABS
ORAL_TABLET | ORAL | Status: AC
  Administered 2015-06-08: 60 mg via ORAL
  Filled 2015-06-08: qty 3

## 2015-06-08 MED ORDER — ACETAMINOPHEN-CODEINE #3 300-30 MG PO TABS
2.0000 | ORAL_TABLET | Freq: Once | ORAL | Status: AC
  Administered 2015-06-08: 2 via ORAL
  Filled 2015-06-08: qty 2

## 2015-06-08 MED ORDER — NICOTINE 14 MG/24HR TD PT24: 14.0000 mg | MEDICATED_PATCH | TRANSDERMAL | Status: DC

## 2015-06-08 MED ORDER — ACETAMINOPHEN-CODEINE #3 300-30 MG PO TABS: 1.0000 | ORAL_TABLET | Freq: Four times a day (QID) | ORAL | Status: DC | PRN

## 2015-06-08 NOTE — ED Provider Notes (Signed)
[facility] Emergency Department Provider Note  ____________________________________________  Time seen: 1:00 AM  I have reviewed the triage vital signs and the nursing notes.   HISTORY  Chief Complaint Cough and Chest Pain    HPI Sara Munoz is a 49 y.o. female presents with complaints of nonproductive cough and dyspnea 2 weeks. Patient says that she was seen in the emergency department approximately one week ago and prescribed antibiotics without relief. Patient has a history of COPD continues to smoke a pack of cigarettes per day. Patient denies any fever Arrival to the Emergency Department Was 97.7     Past Medical History  Diagnosis Date  . Hypertension   . Coronary artery disease   . COPD (chronic obstructive pulmonary disease) ([place])   . COPD (chronic obstructive pulmonary disease) ([place])   . Hypercholesteremia   . Chronic back pain     There are no active problems to display for this patient.   Past Surgical History  Procedure Laterality Date  . Orif femur fracture    . Orif tibia & fibula fractures    . Hemorroids    . Ovary surgery      Current Outpatient Rx  Name  Route  Sig  Dispense  Refill  . acetaminophen-codeine (TYLENOL #3) 300-30 MG tablet   Oral   Take 1 tablet by mouth every 4 (four) hours as needed for moderate pain.   15 tablet   0   . acetaminophen-codeine (TYLENOL #3) 300-30 MG tablet   Oral   Take 1 tablet by mouth every 6 (six) hours as needed for moderate pain.   20 tablet   0   . albuterol (PROVENTIL HFA;VENTOLIN HFA) 108 (90 BASE) MCG/ACT inhaler   Inhalation   Inhale 2 puffs into the lungs every 4 (four) hours as needed for wheezing or shortness of breath.   1 Inhaler   0   . ALPRAZolam (XANAX) 0.5 MG tablet   Oral   Take 0.5 mg by mouth 3 (three) times daily as needed for anxiety.         Marland Kitchen atorvastatin (LIPITOR) 20 MG tablet   Oral   Take 1 tablet (20 mg total) by mouth daily.   30  tablet   0   . budesonide-formoterol (SYMBICORT) 160-4.5 MCG/ACT inhaler   Inhalation   Inhale 2 puffs into the lungs 2 (two) times daily.   1 Inhaler   0   . clonazePAM (KLONOPIN) 1 MG tablet   Oral   Take 1 tablet by mouth 2 (two) times daily.      0   . clopidogrel (PLAVIX) 75 MG tablet   Oral   Take 1 tablet (75 mg total) by mouth daily.   30 tablet   0   . DEXILANT 60 MG capsule   Oral   Take 1 capsule by mouth daily.      0     Dispense as written.   . gabapentin (NEURONTIN) 300 MG capsule   Oral   Take 2 capsules (600 mg total) by mouth 3 (three) times daily.   30 capsule   0   . HYDROcodone-ibuprofen (VICOPROFEN) 7.5-200 MG per tablet   Oral   Take 1 tablet by mouth every 6 (six) hours as needed for moderate pain. Patient not taking: Reported on 05/31/2015   10 tablet   0   . lisinopril (PRINIVIL,ZESTRIL) 20 MG tablet   Oral   Take 1 tablet by mouth daily.  5   . LORazepam (ATIVAN) 0.5 MG tablet   Oral   Take 1 tablet (0.5 mg total) by mouth every 8 (eight) hours as needed for anxiety. Patient not taking: Reported on 05/31/2015   12 tablet   0   . metaxalone (SKELAXIN) 800 MG tablet   Oral   Take 1 tablet (800 mg total) by mouth 3 (three) times daily. Patient not taking: Reported on 05/31/2015   21 tablet   0   . metoprolol tartrate (LOPRESSOR) 25 MG tablet   Oral   Take 1 tablet (25 mg total) by mouth 2 (two) times daily.   60 tablet   0   . ondansetron (ZOFRAN-ODT) 8 MG disintegrating tablet   Oral   Take 1 tablet by mouth every 8 (eight) hours as needed for nausea or vomiting.       0   . potassium chloride (K-DUR,KLOR-CON) 10 MEQ tablet   Oral   Take 1 tablet by mouth daily.      0   . predniSONE (DELTASONE) 20 MG tablet   Oral   Take 3 tablets (60 mg total) by mouth daily with breakfast.   5 tablet   0   . RANEXA 500 MG 12 hr tablet   Oral   Take 1 tablet (500 mg total) by mouth 2 (two) times daily.   60 tablet    0     Dispense as written.   . zolpidem (AMBIEN) 10 MG tablet   Oral   Take 1 tablet by mouth daily.      0     Allergies Flexeril; Keflex; Levaquin; Toradol; Tramadol; and Zoloft  History reviewed. No pertinent family history.  Social History Social History  Substance Use Topics  . Smoking status: Current Every Day Smoker -- 1.00 packs/day    Types: Cigarettes  . Smokeless tobacco: Never Used  . Alcohol Use: Yes    Review of Systems  Constitutional: Negative for fever. Eyes: Negative for visual changes. ENT: Negative for sore throat. Cardiovascular: Negative for chest pain. Respiratory: Positive for cough Gastrointestinal: Negative for abdominal pain, vomiting and diarrhea. Genitourinary: Negative for dysuria. Musculoskeletal: Negative for back pain. Skin: Negative for rash. Neurological: Negative for headaches, focal weakness or numbness.   10-point ROS otherwise negative.  ____________________________________________   PHYSICAL EXAM:  VITAL SIGNS: ED Triage Vitals  Enc Vitals Group     BP 06/07/15 2135 143/95 mmHg     Pulse Rate 06/07/15 2135 87     Resp 06/07/15 2135 20     Temp 06/07/15 2135 97.7 F (36.5 C)     Temp Source 06/07/15 2135 Oral     SpO2 06/07/15 2135 99 %     Weight 06/07/15 2135 171 lb (77.565 kg)     Height 06/07/15 2135 5\' 4"  (1.626 m)     Head Cir --      Peak Flow --      Pain Score 06/07/15 2136 10     Pain Loc --      Pain Edu? --      Excl. in [place]? --     Constitutional: Alert and oriented. Well appearing and in no distress. Eyes: Conjunctivae are normal. PERRL. Normal extraocular movements. ENT   Head: Normocephalic and atraumatic.   Nose: No congestion/rhinnorhea.   Mouth/Throat: Mucous membranes are moist.   Neck: No stridor. Hematological/Lymphatic/Immunilogical: No cervical lymphadenopathy. Cardiovascular: Normal rate, regular rhythm. Normal and symmetric distal pulses are present in all  extremities. No murmurs, rubs, or gallops. Respiratory: Normal respiratory effort without tachypnea nor retractions. Breath sounds are clear and equal bilaterally. No wheezes/rales/rhonchi. Gastrointestinal: Soft and nontender. No distention. There is no CVA tenderness. Genitourinary: deferred Musculoskeletal: Nontender with normal range of motion in all extremities. No joint effusions.  No lower extremity tenderness nor edema. Neurologic:  Normal speech and language. No gross focal neurologic deficits are appreciated. Speech is normal.  Skin:  Skin is warm, dry and intact. No rash noted. Psychiatric: Mood and affect are normal. Speech and behavior are normal. Patient exhibits appropriate insight and judgment.  ____________________________________________    LABS (pertinent positives/negatives)  Labs Reviewed  CBC - Abnormal; Notable for the following:    RBC 5.29 (*)    RDW 16.3 (*)    All other components within normal limits  COMPREHENSIVE METABOLIC PANEL - Abnormal; Notable for the following:    Total Bilirubin 0.1 (*)    All other components within normal limits  TROPONIN I     ____________________________________________   EKG  ED ECG REPORT I, BROWN, [place] N, the attending physician, personally viewed and interpreted this ECG.   Date: 06/08/2015  EKG Time: 9:41 PM  Rate: 80  Rhythm: Normal sinus rhythm  Axis: None  Intervals: Normal  ST&T Change:    ____________________________________________    RADIOLOGY  DG Chest 2 View (Final result) Result time: 06/07/15 22:00:30   Final result by Rad Results In Interface (06/07/15 22:00:30)   Narrative:   CLINICAL DATA: Cough and shortness of breath for 2 weeks, no improvement with antibiotics, history COPD, hypertension, coronary artery disease, smoker  EXAM: CHEST 2 VIEW  COMPARISON: 05/30/2015  FINDINGS: Upper normal size of cardiac silhouette.  Mediastinal contours and pulmonary vascularity  normal.  Minimal chronic central peribronchial thickening.  No pulmonary infiltrate, pleural effusion or pneumothorax.  Bones unremarkable.  IMPRESSION: Minimal chronic bronchitic changes without infiltrate.   Electronically Signed By: Lavonia Dana M.D. On: 06/07/2015 22:00        INITIAL IMPRESSION / ASSESSMENT AND PLAN / ED COURSE  Pertinent labs & imaging results that were available during my care of the patient were reviewed by me and considered in my medical decision making (see chart for details).    ____________________________________________   FINAL CLINICAL IMPRESSION(S) / ED DIAGNOSES  Final diagnoses:  Chronic obstructive pulmonary disease, unspecified COPD type ([place])      Gregor Hams, MD 06/08/15 [phone]

## 2015-06-08 NOTE — Discharge Instructions (Signed)

## 2015-07-04 ENCOUNTER — Emergency Department
Admission: EM | Admit: 2015-07-04 | Discharge: 2015-07-04 | Disposition: A | Discharge status: Home / Self Care | Payer: Medicaid Other | Attending: Emergency Medicine | Admitting: Emergency Medicine

## 2015-07-04 DIAGNOSIS — I10 Essential (primary) hypertension: Secondary | ICD-10-CM | POA: Diagnosis not present

## 2015-07-04 DIAGNOSIS — Y9389 Activity, other specified: Secondary | ICD-10-CM | POA: Insufficient documentation

## 2015-07-04 DIAGNOSIS — S3992XA Unspecified injury of lower back, initial encounter: Secondary | ICD-10-CM | POA: Diagnosis present

## 2015-07-04 DIAGNOSIS — G8929 Other chronic pain: Secondary | ICD-10-CM | POA: Insufficient documentation

## 2015-07-04 DIAGNOSIS — Y998 Other external cause status: Secondary | ICD-10-CM | POA: Diagnosis not present

## 2015-07-04 DIAGNOSIS — S300XXA Contusion of lower back and pelvis, initial encounter: Secondary | ICD-10-CM | POA: Diagnosis not present

## 2015-07-04 DIAGNOSIS — Z7951 Long term (current) use of inhaled steroids: Secondary | ICD-10-CM | POA: Insufficient documentation

## 2015-07-04 DIAGNOSIS — Y92481 Parking lot as the place of occurrence of the external cause: Secondary | ICD-10-CM | POA: Diagnosis not present

## 2015-07-04 DIAGNOSIS — F17210 Nicotine dependence, cigarettes, uncomplicated: Secondary | ICD-10-CM | POA: Insufficient documentation

## 2015-07-04 DIAGNOSIS — Z79899 Other long term (current) drug therapy: Secondary | ICD-10-CM | POA: Insufficient documentation

## 2015-07-04 MED ORDER — MORPHINE SULFATE (PF) 4 MG/ML IV SOLN
4.0000 mg | Freq: Once | INTRAVENOUS | Status: AC
  Administered 2015-07-04: 4 mg via INTRAMUSCULAR
  Filled 2015-07-04: qty 1

## 2015-07-04 MED ORDER — NAPROXEN 500 MG PO TABS: 500.0000 mg | ORAL_TABLET | Freq: Two times a day (BID) | ORAL | Status: DC

## 2015-07-04 MED ORDER — DIAZEPAM 2 MG PO TABS: 2.0000 mg | ORAL_TABLET | Freq: Three times a day (TID) | ORAL | Status: DC | PRN

## 2015-07-04 NOTE — ED Notes (Signed)
Pt states she slipped in the icy parking lot 2 days ago and is having mid to lower back pain

## 2015-07-04 NOTE — ED Provider Notes (Signed)
[facility] Emergency Department Provider Note ?  ? ____________________________________________ ? Time seen: 3:32 PM ? I have reviewed the triage vital signs and the nursing notes.  ________ HISTORY ? Chief Complaint Back Pain     HPI  Sara Munoz is a 49 y.o. female   who presents emergency department complaining of lower back pain. Per the patient she was getting out of her vehicle 2 days prior when she slipped, landing on her lower back. She is endorsing pain to the lumbar region. Patient does endorse chronic back pain with radicular symptoms. She says there is no change in the radicular symptoms when compared to normal. No bowel or bladder dysfunction. Patient did not hit her head or lose consciousness. ? ? ? Past Medical History  Diagnosis Date  . Hypertension   . Coronary artery disease   . COPD (chronic obstructive pulmonary disease) ([place])   . COPD (chronic obstructive pulmonary disease) ([place])   . Hypercholesteremia   . Chronic back pain     There are no active problems to display for this patient.  ? Past Surgical History  Procedure Laterality Date  . Orif femur fracture    . Orif tibia & fibula fractures    . Hemorroids    . Ovary surgery     ? Current Outpatient Rx  Name  Route  Sig  Dispense  Refill  . acetaminophen-codeine (TYLENOL #3) 300-30 MG tablet   Oral   Take 1 tablet by mouth every 4 (four) hours as needed for moderate pain.   15 tablet   0   . acetaminophen-codeine (TYLENOL #3) 300-30 MG tablet   Oral   Take 1 tablet by mouth every 6 (six) hours as needed for moderate pain.   20 tablet   0   . albuterol (PROVENTIL HFA;VENTOLIN HFA) 108 (90 BASE) MCG/ACT inhaler   Inhalation   Inhale 2 puffs into the lungs every 4 (four) hours as needed for wheezing or shortness of breath.   1 Inhaler   0   . ALPRAZolam (XANAX) 0.5 MG tablet   Oral   Take 0.5 mg by mouth 3 (three) times daily as needed for anxiety.          Marland Kitchen atorvastatin (LIPITOR) 20 MG tablet   Oral   Take 1 tablet (20 mg total) by mouth daily.   30 tablet   0   . budesonide-formoterol (SYMBICORT) 160-4.5 MCG/ACT inhaler   Inhalation   Inhale 2 puffs into the lungs 2 (two) times daily.   1 Inhaler   0   . clonazePAM (KLONOPIN) 1 MG tablet   Oral   Take 1 tablet by mouth 2 (two) times daily.      0   . clopidogrel (PLAVIX) 75 MG tablet   Oral   Take 1 tablet (75 mg total) by mouth daily.   30 tablet   0   . DEXILANT 60 MG capsule   Oral   Take 1 capsule by mouth daily.      0     Dispense as written.   . diazepam (VALIUM) 2 MG tablet   Oral   Take 1 tablet (2 mg total) by mouth every 8 (eight) hours as needed for anxiety.   15 tablet   0   . gabapentin (NEURONTIN) 300 MG capsule   Oral   Take 2 capsules (600 mg total) by mouth 3 (three) times daily.   30 capsule   0   .  HYDROcodone-ibuprofen (VICOPROFEN) 7.5-200 MG per tablet   Oral   Take 1 tablet by mouth every 6 (six) hours as needed for moderate pain. Patient not taking: Reported on 05/31/2015   10 tablet   0   . lisinopril (PRINIVIL,ZESTRIL) 20 MG tablet   Oral   Take 1 tablet by mouth daily.      5   . LORazepam (ATIVAN) 0.5 MG tablet   Oral   Take 1 tablet (0.5 mg total) by mouth every 8 (eight) hours as needed for anxiety. Patient not taking: Reported on 05/31/2015   12 tablet   0   . metaxalone (SKELAXIN) 800 MG tablet   Oral   Take 1 tablet (800 mg total) by mouth 3 (three) times daily. Patient not taking: Reported on 05/31/2015   21 tablet   0   . metoprolol tartrate (LOPRESSOR) 25 MG tablet   Oral   Take 1 tablet (25 mg total) by mouth 2 (two) times daily.   60 tablet   0   . naproxen (NAPROSYN) 500 MG tablet   Oral   Take 1 tablet (500 mg total) by mouth 2 (two) times daily with a meal.   60 tablet   0   . nicotine (NICODERM CQ - DOSED IN MG/24 HOURS) 14 mg/24hr patch   Transdermal   Place 1 patch (14 mg total) onto  the skin daily.   30 patch   0   . ondansetron (ZOFRAN-ODT) 8 MG disintegrating tablet   Oral   Take 1 tablet by mouth every 8 (eight) hours as needed for nausea or vomiting.       0   . potassium chloride (K-DUR,KLOR-CON) 10 MEQ tablet   Oral   Take 1 tablet by mouth daily.      0   . RANEXA 500 MG 12 hr tablet   Oral   Take 1 tablet (500 mg total) by mouth 2 (two) times daily.   60 tablet   0     Dispense as written.   . zolpidem (AMBIEN) 10 MG tablet   Oral   Take 1 tablet by mouth daily.      0    ? Allergies Flexeril; Keflex; Levaquin; Toradol; Tramadol; and Zoloft ? No family history on file. ? Social History Social History  Substance Use Topics  . Smoking status: Current Every Day Smoker -- 1.00 packs/day    Types: Cigarettes  . Smokeless tobacco: Never Used  . Alcohol Use: Yes   ? Review of Systems Constitutional: no fever. Eyes: no discharge ENT: no sore throat. Cardiovascular: no chest pain. Respiratory: no cough. No sob Gastrointestinal: denies abdominal pain, vomiting, diarrhea, and constipation Genitourinary: no dysuria. Negative for hematuria Musculoskeletal: Positive for back pain. Skin: Negative for rash. Neurological: Negative for headaches  10-point ROS otherwise negative.  _______________ PHYSICAL EXAM: ? VITAL SIGNS:   ED Triage Vitals  Enc Vitals Group     BP 07/04/15 1505 171/101 mmHg     Pulse Rate 07/04/15 1505 72     Resp 07/04/15 1505 18     Temp 07/04/15 1505 98.4 F (36.9 C)     Temp Source 07/04/15 1505 Oral     SpO2 07/04/15 1505 99 %     Weight 07/04/15 1505 170 lb (77.111 kg)     Height 07/04/15 1505 5\' 4"  (1.626 m)     Head Cir --      Peak Flow --      Pain  Score 07/04/15 1505 10     Pain Loc --      Pain Edu? --      Excl. in [place]? --    ?  Constitutional: Alert and oriented. Well appearing and in no distress. Eyes: Conjunctivae are normal.  ENT      Head: Normocephalic and atraumatic.      Ears:        Nose: No congestion/rhinnorhea.      Mouth/Throat: Mucous membranes are moist.   Hematological/Lymphatic/Immunilogical: No cervical lymphadenopathy. Cardiovascular: Normal rate, regular rhythm. Normal S1 and S2. Respiratory: Normal respiratory effort without tachypnea nor retractions. Lungs CTAB. Gastrointestinal: Soft and nontender. No distention. There is no CVA tenderness. Genitourinary:  Musculoskeletal: Nontender with normal range of motion in all extremities. Full deformity to lower back upon inspection. Patient is diffusely tender to palpation midline and bilateral paraspinal muscles. There is no bruising or ecchymosis noted in the area. Full range of motion to spine. Negative straight leg raise bilaterally. Sensation is intact and equal bilateral lower extremities. Dorsalis pedis pulses palpated bilaterally lower extremities.  Neurologic:  Normal speech and language. No gross focal neurologic deficits are appreciated. Skin:  Skin is warm, dry and intact. No rash noted. Psychiatric: Mood and affect are normal. Speech and behavior are normal. Patient exhibits appropriate insight and judgment.    LABS (all labs ordered are listed, but only abnormal results are displayed)  Labs Reviewed - No data to display  ___________ RADIOLOGY    _____________ PROCEDURES ? Procedure(s) performed:    Medications  morphine 4 MG/ML injection 4 mg (4 mg Intramuscular Given 07/04/15 1531)    ______________________________________________________ INITIAL IMPRESSION / ASSESSMENT AND PLAN / ED COURSE ? Pertinent labs & imaging results that were available during my care of the patient were reviewed by me and considered in my medical decision making (see chart for details).    Patient's diagnoses is consistent with lumbar contusion. Patient has no change in her radicular symptoms from baseline. There is no acute abnormality. Patient is given an injection and emergency department for pain and  sent home with anti-inflammatories and muscle relaxers.    New Prescriptions   DIAZEPAM (VALIUM) 2 MG TABLET    Take 1 tablet (2 mg total) by mouth every 8 (eight) hours as needed for anxiety.   NAPROXEN (NAPROSYN) 500 MG TABLET    Take 1 tablet (500 mg total) by mouth 2 (two) times daily with a meal.   ____________________________________________ FINAL CLINICAL IMPRESSION(S) / ED DIAGNOSES?  Final diagnoses:  Lumbar contusion, initial encounter            Darletta Moll, [name] 07/04/15 [place], MD 07/04/15 1736

## 2015-07-04 NOTE — ED Notes (Signed)
States she slipped getting out of the truck 2 days ago  having lower back pain  Ambulates slowly d/t increased pain

## 2015-07-04 NOTE — Discharge Instructions (Signed)

## 2015-08-01 ENCOUNTER — Emergency Department: Payer: Medicaid Other

## 2015-08-01 ENCOUNTER — Observation Stay
Admission: EM | Admit: 2015-08-01 | Discharge: 2015-08-03 | Disposition: A | Discharge status: Home / Self Care | Payer: Medicaid Other | Attending: Internal Medicine | Admitting: Internal Medicine

## 2015-08-01 ENCOUNTER — Encounter: Payer: Self-pay | Admitting: Emergency Medicine

## 2015-08-01 DIAGNOSIS — R079 Chest pain, unspecified: Secondary | ICD-10-CM | POA: Diagnosis present

## 2015-08-01 DIAGNOSIS — I10 Essential (primary) hypertension: Secondary | ICD-10-CM | POA: Diagnosis not present

## 2015-08-01 DIAGNOSIS — Z881 Allergy status to other antibiotic agents status: Secondary | ICD-10-CM | POA: Insufficient documentation

## 2015-08-01 DIAGNOSIS — I252 Old myocardial infarction: Secondary | ICD-10-CM | POA: Insufficient documentation

## 2015-08-01 DIAGNOSIS — E785 Hyperlipidemia, unspecified: Secondary | ICD-10-CM | POA: Diagnosis present

## 2015-08-01 DIAGNOSIS — Z9114 Patient's other noncompliance with medication regimen: Secondary | ICD-10-CM | POA: Insufficient documentation

## 2015-08-01 DIAGNOSIS — R05 Cough: Secondary | ICD-10-CM | POA: Diagnosis not present

## 2015-08-01 DIAGNOSIS — E876 Hypokalemia: Secondary | ICD-10-CM | POA: Diagnosis not present

## 2015-08-01 DIAGNOSIS — Z885 Allergy status to narcotic agent status: Secondary | ICD-10-CM | POA: Diagnosis not present

## 2015-08-01 DIAGNOSIS — F17210 Nicotine dependence, cigarettes, uncomplicated: Secondary | ICD-10-CM | POA: Insufficient documentation

## 2015-08-01 DIAGNOSIS — J449 Chronic obstructive pulmonary disease, unspecified: Secondary | ICD-10-CM | POA: Insufficient documentation

## 2015-08-01 DIAGNOSIS — M549 Dorsalgia, unspecified: Secondary | ICD-10-CM | POA: Diagnosis not present

## 2015-08-01 DIAGNOSIS — Z888 Allergy status to other drugs, medicaments and biological substances status: Secondary | ICD-10-CM | POA: Insufficient documentation

## 2015-08-01 DIAGNOSIS — G8929 Other chronic pain: Secondary | ICD-10-CM | POA: Diagnosis not present

## 2015-08-01 DIAGNOSIS — E7800 Pure hypercholesterolemia, unspecified: Secondary | ICD-10-CM | POA: Insufficient documentation

## 2015-08-01 DIAGNOSIS — M940 Chondrocostal junction syndrome [Tietze]: Secondary | ICD-10-CM | POA: Clinically undetermined

## 2015-08-01 DIAGNOSIS — I2510 Atherosclerotic heart disease of native coronary artery without angina pectoris: Secondary | ICD-10-CM | POA: Diagnosis not present

## 2015-08-01 DIAGNOSIS — R0789 Other chest pain: Principal | ICD-10-CM | POA: Insufficient documentation

## 2015-08-01 DIAGNOSIS — Z7982 Long term (current) use of aspirin: Secondary | ICD-10-CM | POA: Insufficient documentation

## 2015-08-01 DIAGNOSIS — Z79899 Other long term (current) drug therapy: Secondary | ICD-10-CM | POA: Diagnosis not present

## 2015-08-01 DIAGNOSIS — Z8673 Personal history of transient ischemic attack (TIA), and cerebral infarction without residual deficits: Secondary | ICD-10-CM | POA: Insufficient documentation

## 2015-08-01 DIAGNOSIS — I160 Hypertensive urgency: Secondary | ICD-10-CM | POA: Diagnosis present

## 2015-08-01 HISTORY — DX: Acute myocardial infarction, unspecified: I21.9

## 2015-08-01 LAB — COMPREHENSIVE METABOLIC PANEL
ALT: 87 U/L — ABNORMAL HIGH (ref 14–54)
AST: 81 U/L — ABNORMAL HIGH (ref 15–41)
Albumin: 4 g/dL (ref 3.5–5.0)
Alkaline Phosphatase: 81 U/L (ref 38–126)
Anion gap: 9 (ref 5–15)
BUN: 6 mg/dL (ref 6–20)
CO2: 25 mmol/L (ref 22–32)
Calcium: 9 mg/dL (ref 8.9–10.3)
Chloride: 106 mmol/L (ref 101–111)
Creatinine, Ser: 0.8 mg/dL (ref 0.44–1.00)
GFR calc Af Amer: >60 mL/min (ref >60)
GFR calc non Af Amer: >60 mL/min (ref >60)
Glucose, Bld: 93 mg/dL (ref 65–99)
Potassium: 3.3 mmol/L — ABNORMAL LOW (ref 3.5–5.1)
Sodium: 140 mmol/L (ref 135–145)
Total Bilirubin: 0.2 mg/dL — ABNORMAL LOW (ref 0.3–1.2)
Total Protein: 7.1 g/dL (ref 6.5–8.1)

## 2015-08-01 LAB — CBC
HCT: 42.5 % (ref 35.0–47.0)
Hemoglobin: 14.2 g/dL (ref 12.0–16.0)
MCH: 28.4 pg (ref 26.0–34.0)
MCHC: 33.5 g/dL (ref 32.0–36.0)
MCV: 84.7 fL (ref 80.0–100.0)
Platelets: 240 10*3/uL (ref 150–440)
RBC: 5.02 MIL/uL (ref 3.80–5.20)
RDW: 17.7 % — ABNORMAL HIGH (ref 11.5–14.5)
WBC: 8.3 10*3/uL (ref 3.6–11.0)

## 2015-08-01 LAB — TROPONIN I: Troponin I: <0.03 ng/mL (ref <0.031)

## 2015-08-01 LAB — BRAIN NATRIURETIC PEPTIDE: B Natriuretic Peptide: 24 pg/mL (ref 0.0–100.0)

## 2015-08-01 NOTE — ED Notes (Signed)
Pt arrived to the ED via EMS for complaints of chest pain x3 days with a history of MI and cardiac catheterization. Pt reports that she is new to the area and has not been able to take her medication for the last month. Pt reports nausea, radiating pain to the left arm and feeling like an "elephant is sitting on her chest." Pt is AOx4 anxious stating that this feels like her previous MI.

## 2015-08-02 ENCOUNTER — Encounter: Payer: Self-pay | Admitting: Internal Medicine

## 2015-08-02 ENCOUNTER — Observation Stay (HOSPITAL_BASED_OUTPATIENT_CLINIC_OR_DEPARTMENT_OTHER)
Admit: 2015-08-02 | Discharge: 2015-08-02 | Disposition: A | Discharge status: Home / Self Care | Payer: Medicaid Other | Attending: Internal Medicine | Admitting: Internal Medicine

## 2015-08-02 DIAGNOSIS — I10 Essential (primary) hypertension: Secondary | ICD-10-CM | POA: Diagnosis not present

## 2015-08-02 DIAGNOSIS — E785 Hyperlipidemia, unspecified: Secondary | ICD-10-CM | POA: Diagnosis present

## 2015-08-02 DIAGNOSIS — R0789 Other chest pain: Secondary | ICD-10-CM

## 2015-08-02 DIAGNOSIS — R079 Chest pain, unspecified: Secondary | ICD-10-CM

## 2015-08-02 DIAGNOSIS — G8929 Other chronic pain: Secondary | ICD-10-CM

## 2015-08-02 DIAGNOSIS — M549 Dorsalgia, unspecified: Secondary | ICD-10-CM | POA: Diagnosis not present

## 2015-08-02 DIAGNOSIS — I2510 Atherosclerotic heart disease of native coronary artery without angina pectoris: Secondary | ICD-10-CM | POA: Diagnosis present

## 2015-08-02 DIAGNOSIS — M940 Chondrocostal junction syndrome [Tietze]: Secondary | ICD-10-CM | POA: Clinically undetermined

## 2015-08-02 HISTORY — DX: Other chest pain: R07.89

## 2015-08-02 LAB — OCCULT BLOOD X 1 CARD TO LAB, STOOL: Fecal Occult Bld: NEGATIVE

## 2015-08-02 LAB — CBC
HCT: 38 % (ref 35.0–47.0)
Hemoglobin: 12.8 g/dL (ref 12.0–16.0)
MCH: 28.3 pg (ref 26.0–34.0)
MCHC: 33.5 g/dL (ref 32.0–36.0)
MCV: 84.4 fL (ref 80.0–100.0)
Platelets: 211 10*3/uL (ref 150–440)
RBC: 4.51 MIL/uL (ref 3.80–5.20)
RDW: 17.4 % — ABNORMAL HIGH (ref 11.5–14.5)
WBC: 6.7 10*3/uL (ref 3.6–11.0)

## 2015-08-02 LAB — URINALYSIS COMPLETE WITH MICROSCOPIC (ARMC ONLY)
Bilirubin Urine: NEGATIVE
Glucose, UA: NEGATIVE mg/dL
Hgb urine dipstick: NEGATIVE
Ketones, ur: NEGATIVE mg/dL
Nitrite: POSITIVE — AB
Protein, ur: 30 mg/dL — AB
Specific Gravity, Urine: 1.024 (ref 1.005–1.030)
pH: 5 (ref 5.0–8.0)

## 2015-08-02 LAB — LIPID PANEL
Cholesterol: 149 mg/dL (ref 0–200)
HDL: 28 mg/dL — ABNORMAL LOW (ref >40)
LDL Cholesterol: 97 mg/dL (ref 0–99)
Total CHOL/HDL Ratio: 5.3 RATIO
Triglycerides: 118 mg/dL (ref <150)
VLDL: 24 mg/dL (ref 0–40)

## 2015-08-02 LAB — BASIC METABOLIC PANEL
Anion gap: 9 (ref 5–15)
BUN: 6 mg/dL (ref 6–20)
CO2: 22 mmol/L (ref 22–32)
Calcium: 8.3 mg/dL — ABNORMAL LOW (ref 8.9–10.3)
Chloride: 104 mmol/L (ref 101–111)
Creatinine, Ser: 0.82 mg/dL (ref 0.44–1.00)
GFR calc Af Amer: >60 mL/min (ref >60)
GFR calc non Af Amer: >60 mL/min (ref >60)
Glucose, Bld: 104 mg/dL — ABNORMAL HIGH (ref 65–99)
Potassium: 3 mmol/L — ABNORMAL LOW (ref 3.5–5.1)
Sodium: 135 mmol/L (ref 135–145)

## 2015-08-02 LAB — URINE DRUG SCREEN, QUALITATIVE (ARMC ONLY)
Amphetamines, Ur Screen: NOT DETECTED
Barbiturates, Ur Screen: NOT DETECTED
Benzodiazepine, Ur Scrn: POSITIVE — AB
Cannabinoid 50 Ng, Ur ~~LOC~~: NOT DETECTED
Cocaine Metabolite,Ur ~~LOC~~: NOT DETECTED
MDMA (Ecstasy)Ur Screen: NOT DETECTED
Methadone Scn, Ur: NOT DETECTED
Opiate, Ur Screen: POSITIVE — AB
Phencyclidine (PCP) Ur S: NOT DETECTED
Tricyclic, Ur Screen: NOT DETECTED

## 2015-08-02 LAB — TROPONIN I
Troponin I: <0.03 ng/mL (ref <0.031)
Troponin I: <0.03 ng/mL (ref <0.031)
Troponin I: 0.03 ng/mL (ref <0.031)

## 2015-08-02 LAB — MAGNESIUM: Magnesium: 1.6 mg/dL — ABNORMAL LOW (ref 1.7–2.4)

## 2015-08-02 MED ORDER — ASPIRIN 81 MG PO CHEW
CHEWABLE_TABLET | ORAL | Status: AC
  Administered 2015-08-02: 02:00:00
  Filled 2015-08-02: qty 4

## 2015-08-02 MED ORDER — ZOLPIDEM TARTRATE 5 MG PO TABS
10.0000 mg | ORAL_TABLET | Freq: Every day | ORAL | Status: DC
  Administered 2015-08-02: 10 mg via ORAL
  Filled 2015-08-02: qty 2

## 2015-08-02 MED ORDER — NITROGLYCERIN 0.4 MG SL SUBL
0.4000 mg | SUBLINGUAL_TABLET | SUBLINGUAL | Status: DC | PRN
  Administered 2015-08-02 (×2): 0.4 mg via SUBLINGUAL
  Filled 2015-08-02: qty 3

## 2015-08-02 MED ORDER — PANTOPRAZOLE SODIUM 40 MG PO TBEC
40.0000 mg | DELAYED_RELEASE_TABLET | Freq: Every day | ORAL | Status: DC
  Administered 2015-08-02 – 2015-08-03 (×2): 40 mg via ORAL
  Filled 2015-08-02 (×2): qty 1

## 2015-08-02 MED ORDER — LISINOPRIL 20 MG PO TABS
20.0000 mg | ORAL_TABLET | Freq: Every day | ORAL | Status: DC
  Administered 2015-08-02 – 2015-08-03 (×2): 20 mg via ORAL
  Filled 2015-08-02 (×2): qty 1

## 2015-08-02 MED ORDER — BUDESONIDE-FORMOTEROL FUMARATE 160-4.5 MCG/ACT IN AERO
2.0000 | INHALATION_SPRAY | Freq: Two times a day (BID) | RESPIRATORY_TRACT | Status: DC
  Administered 2015-08-02 – 2015-08-03 (×3): 2 via RESPIRATORY_TRACT
  Filled 2015-08-02: qty 6

## 2015-08-02 MED ORDER — SODIUM CHLORIDE 0.9% FLUSH
3.0000 mL | Freq: Two times a day (BID) | INTRAVENOUS | Status: DC
  Administered 2015-08-02 – 2015-08-03 (×3): 3 mL via INTRAVENOUS

## 2015-08-02 MED ORDER — LABETALOL HCL 5 MG/ML IV SOLN
INTRAVENOUS | Status: AC
  Administered 2015-08-02: 02:00:00
  Filled 2015-08-02: qty 4

## 2015-08-02 MED ORDER — RANOLAZINE ER 500 MG PO TB12
500.0000 mg | ORAL_TABLET | Freq: Two times a day (BID) | ORAL | Status: DC
  Administered 2015-08-02 – 2015-08-03 (×3): 500 mg via ORAL
  Filled 2015-08-02 (×3): qty 1

## 2015-08-02 MED ORDER — ACETAMINOPHEN 325 MG PO TABS
650.0000 mg | ORAL_TABLET | ORAL | Status: DC | PRN
  Administered 2015-08-02: 650 mg via ORAL
  Filled 2015-08-02: qty 2

## 2015-08-02 MED ORDER — POTASSIUM CHLORIDE CRYS ER 20 MEQ PO TBCR
40.0000 meq | EXTENDED_RELEASE_TABLET | Freq: Once | ORAL | Status: AC
  Administered 2015-08-02: 40 meq via ORAL
  Filled 2015-08-02: qty 2

## 2015-08-02 MED ORDER — SODIUM CHLORIDE 0.9% FLUSH: 3.0000 mL | INTRAVENOUS | Status: DC | PRN

## 2015-08-02 MED ORDER — NICOTINE 14 MG/24HR TD PT24
14.0000 mg | MEDICATED_PATCH | TRANSDERMAL | Status: DC
  Administered 2015-08-02 – 2015-08-03 (×2): 14 mg via TRANSDERMAL
  Filled 2015-08-02 (×2): qty 1

## 2015-08-02 MED ORDER — SULFAMETHOXAZOLE-TRIMETHOPRIM 800-160 MG PO TABS
0.5000 | ORAL_TABLET | Freq: Two times a day (BID) | ORAL | Status: DC
  Administered 2015-08-02 (×2): 0.5 via ORAL
  Filled 2015-08-02 (×2): qty 1

## 2015-08-02 MED ORDER — SODIUM CHLORIDE 0.9 % IV SOLN: 250.0000 mL | INTRAVENOUS | Status: DC | PRN

## 2015-08-02 MED ORDER — ASPIRIN 300 MG RE SUPP: 300.0000 mg | RECTAL | Status: AC

## 2015-08-02 MED ORDER — CLONAZEPAM 1 MG PO TABS
1.0000 mg | ORAL_TABLET | Freq: Two times a day (BID) | ORAL | Status: DC
  Administered 2015-08-02 – 2015-08-03 (×3): 1 mg via ORAL
  Filled 2015-08-02 (×3): qty 1

## 2015-08-02 MED ORDER — ASPIRIN 81 MG PO CHEW
324.0000 mg | CHEWABLE_TABLET | ORAL | Status: AC
  Administered 2015-08-02: 324 mg via ORAL
  Filled 2015-08-02: qty 4

## 2015-08-02 MED ORDER — ATORVASTATIN CALCIUM 20 MG PO TABS
20.0000 mg | ORAL_TABLET | Freq: Every day | ORAL | Status: DC
  Administered 2015-08-02 – 2015-08-03 (×2): 20 mg via ORAL
  Filled 2015-08-02 (×2): qty 1

## 2015-08-02 MED ORDER — HYDRALAZINE HCL 20 MG/ML IJ SOLN
10.0000 mg | INTRAMUSCULAR | Status: DC | PRN
  Administered 2015-08-02 (×2): 10 mg via INTRAVENOUS
  Filled 2015-08-02 (×2): qty 1

## 2015-08-02 MED ORDER — MORPHINE SULFATE (PF) 2 MG/ML IV SOLN
2.0000 mg | INTRAVENOUS | Status: DC | PRN
  Administered 2015-08-02 – 2015-08-03 (×4): 2 mg via INTRAVENOUS
  Filled 2015-08-02 (×4): qty 1

## 2015-08-02 MED ORDER — MAGNESIUM SULFATE 2 GM/50ML IV SOLN
2.0000 g | Freq: Once | INTRAVENOUS | Status: AC
  Administered 2015-08-02: 2 g via INTRAVENOUS
  Filled 2015-08-02: qty 50

## 2015-08-02 MED ORDER — METOPROLOL TARTRATE 25 MG PO TABS: 25.0000 mg | ORAL_TABLET | Freq: Two times a day (BID) | ORAL | Status: DC

## 2015-08-02 MED ORDER — CLOPIDOGREL BISULFATE 75 MG PO TABS: 75.0000 mg | ORAL_TABLET | Freq: Every day | ORAL | Status: DC

## 2015-08-02 MED ORDER — ASPIRIN EC 81 MG PO TBEC
81.0000 mg | DELAYED_RELEASE_TABLET | Freq: Every day | ORAL | Status: DC
  Administered 2015-08-03: 81 mg via ORAL
  Filled 2015-08-02: qty 1

## 2015-08-02 MED ORDER — ENOXAPARIN SODIUM 40 MG/0.4ML ~~LOC~~ SOLN
40.0000 mg | SUBCUTANEOUS | Status: DC
  Administered 2015-08-02 – 2015-08-03 (×2): 40 mg via SUBCUTANEOUS
  Filled 2015-08-02 (×2): qty 0.4

## 2015-08-02 MED ORDER — GABAPENTIN 300 MG PO CAPS
600.0000 mg | ORAL_CAPSULE | Freq: Three times a day (TID) | ORAL | Status: DC
  Administered 2015-08-02 – 2015-08-03 (×4): 600 mg via ORAL
  Filled 2015-08-02 (×4): qty 2

## 2015-08-02 MED ORDER — ALBUTEROL SULFATE (2.5 MG/3ML) 0.083% IN NEBU: 3.0000 mL | INHALATION_SOLUTION | RESPIRATORY_TRACT | Status: DC | PRN

## 2015-08-02 MED ORDER — METOPROLOL TARTRATE 50 MG PO TABS
50.0000 mg | ORAL_TABLET | Freq: Two times a day (BID) | ORAL | Status: DC
  Administered 2015-08-02 – 2015-08-03 (×2): 50 mg via ORAL
  Filled 2015-08-02 (×3): qty 1

## 2015-08-02 MED ORDER — CLOPIDOGREL BISULFATE 75 MG PO TABS
75.0000 mg | ORAL_TABLET | Freq: Every day | ORAL | Status: DC
  Administered 2015-08-02: 75 mg via ORAL
  Filled 2015-08-02: qty 1

## 2015-08-02 MED ORDER — DIAZEPAM 2 MG PO TABS
2.0000 mg | ORAL_TABLET | Freq: Three times a day (TID) | ORAL | Status: DC | PRN
  Administered 2015-08-02: 2 mg via ORAL
  Filled 2015-08-02: qty 1

## 2015-08-02 MED ORDER — ONDANSETRON HCL 4 MG/2ML IJ SOLN: 4.0000 mg | Freq: Four times a day (QID) | INTRAMUSCULAR | Status: DC | PRN

## 2015-08-02 MED ORDER — MORPHINE SULFATE (PF) 10 MG/ML IV SOLN
INTRAVENOUS | Status: AC
  Administered 2015-08-02: 02:00:00
  Filled 2015-08-02: qty 1

## 2015-08-02 MED ORDER — OXYCODONE HCL 5 MG PO TABS
5.0000 mg | ORAL_TABLET | Freq: Four times a day (QID) | ORAL | Status: DC | PRN
  Administered 2015-08-02 – 2015-08-03 (×3): 5 mg via ORAL
  Filled 2015-08-02 (×3): qty 1

## 2015-08-02 NOTE — Progress Notes (Signed)
*  PRELIMINARY RESULTS* Echocardiogram 2D Echocardiogram has been performed.  Sara Munoz 08/02/2015, 2:33 PM

## 2015-08-02 NOTE — Care Management (Signed)
Informed that patient has not gotten her medications filled in several months.   Patient "just moved to [facility]- four months ago" from [place].  She did not have her PCP changed on her card so does not have PCP in [employer]. She denies having issues obtaining her medications.  The only thing that was standing in her way of getting her prescriptions refilled ws not having a physician.   Discussed that hospitalist may choose not to provide scripts for her narcotics.  Discussed the importance of getting her medicaid transferred to [facility].  Provide her with written list of local physicians accepting new patients.

## 2015-08-02 NOTE — Progress Notes (Signed)
KRISSA LAPOINTE is a 50 y.o. female patient admitted from ED awake, alert - oriented  X 4 - no acute distress noted.  VSS - Blood pressure 186/106, pulse 85, temperature 98.1 F (36.7 C), temperature source Oral, resp. rate 16, height 5\' 4"  (1.626 m), weight 81.012 kg (178 lb 9.6 oz), last menstrual period 07/26/2015, SpO2 94 %.    IV in place, occlusive dsg intact without redness.  Orientation to room, and floor completed with information packet given to patient/family.  Patient declined safety video at this time.  Admission INP armband ID verified with patient/family, and in place.   SR up x 2, fall assessment complete, with patient and family able to verbalize understanding of risk associated with falls, and verbalized understanding to call nsg before up out of bed.  Call light within reach, patient able to voice, and demonstrate understanding.  Skin assess and telemetry verified with Sabra Heck, RN.  MD Dr. Estanislado Pandy notified of need for prn antihypertensive and prn pain medication. Given orders for hydralazine and morphine.  Will cont to eval and treat per MD orders.  Rachael Fee, RN

## 2015-08-02 NOTE — Progress Notes (Signed)
Nitro 0.4mg  sl given for chest pain 9/10.  Dr. Bridgett Larsson in room

## 2015-08-02 NOTE — ED Notes (Signed)
Pt reports she came in by ems from home for chest pain.  Iv in place.  Pt reports no medicines  approx  4 months.  Intermittent sob.  cig smoker.  No cough.  No n/v/d. Pt states pain in center of chest and mid back.  Pt alert. Speech clear.  Sinus on monitor.

## 2015-08-02 NOTE — Progress Notes (Signed)
Pt states 9/10, unchanged.  States she is withdrawing from her meds (Oxycodone and Morphine), her pain is chronic.  Will update Dr. Bridgett Larsson

## 2015-08-02 NOTE — ED Notes (Signed)
Computer downtime, see paper documentation for period during downtime

## 2015-08-02 NOTE — Progress Notes (Signed)
Spoke with Dr. Bridgett Larsson about pt request to speak with him, he will be up later.  Informed him of pt MG of 1.6, orders received

## 2015-08-02 NOTE — Progress Notes (Signed)
Pt states her anxiety a little better, will continue to monitor.

## 2015-08-02 NOTE — Plan of Care (Signed)
Problem: Pain Managment: Goal: General experience of comfort will improve Outcome: Not Progressing Prn medications  Problem: Tissue Perfusion: Goal: Risk factors for ineffective tissue perfusion will decrease Outcome: Progressing SQ Lovenox

## 2015-08-02 NOTE — Progress Notes (Signed)
Valium 2mg  po given per Dr. Bridgett Larsson order for pt, he feels part of the pain is anxiety.  Will monitor.

## 2015-08-02 NOTE — H&P (Signed)
Sara Munoz at [place] NAME: Sara Munoz    MR#:  [id number]  DATE OF BIRTH:  [birth year]  DATE OF ADMISSION:  08/01/2015  PRIMARY CARE PHYSICIAN: No PCP Per Patient   REQUESTING/REFERRING PHYSICIAN:   CHIEF COMPLAINT:   Chief Complaint  Patient presents with  . Chest Pain  . Cough    HISTORY OF PRESENT ILLNESS: Sara Munoz  is a 50 y.o. female with a known history of hypertension, coronary artery disease, COPD, hyperlipidemia presented to the emergency room with chest pain for 3 days. Chest pain is located retro sternally. She felt initially as heaviness later on some sharp pain. Pain was 10 out of 10 on a scale of 1-10. There was radiation of the pain to left arm. Patient also complains of some tingling and numbness in the neck and the jaw area. Has occasional cough which is dry in nature. Patient has history of COPD not on home oxygen. An active smoker. Had multiple cardiac catheters in the past and also says she had some stents placed. First set of troponin was negative during the workup in the emergency room and EKG did not show any ST segment abnormality. No complaints of any orthopnea or proximal nocturnal dyspnea.In the emergency room, patient has elevated blood pressure.Patient not compliant with her medications and not taking them for last two months.Blood pressure was controlled in ER with IV labetalol.  PAST MEDICAL HISTORY:   Past Medical History  Diagnosis Date  . Hypertension   . Coronary artery disease   . COPD (chronic obstructive pulmonary disease) ([place])   . COPD (chronic obstructive pulmonary disease) ([place])   . Hypercholesteremia   . Chronic back pain   . MI (myocardial infarction) ([place])     PAST SURGICAL HISTORY: Past Surgical History  Procedure Laterality Date  . Orif femur fracture    . Orif tibia & fibula fractures    . Hemorroids    . Ovary surgery      SOCIAL HISTORY:  Social History  Substance Use  Topics  . Smoking status: Current Every Day Smoker -- 1.00 packs/day    Types: Cigarettes  . Smokeless tobacco: Never Used  . Alcohol Use: 0.0 oz/week    0 Standard drinks or equivalent per week     Comment: drinks occasionally    FAMILY HISTORY:  Family History  Problem Relation Age of Onset  . Heart disease Mother   . Lung cancer Mother   . Ovarian cancer Mother     DRUG ALLERGIES:  Allergies  Allergen Reactions  . Flexeril [Cyclobenzaprine] Swelling  . Keflex [Cephalexin] Hives  . Levaquin [Levofloxacin In D5w] Hives  . Toradol [Ketorolac Tromethamine] Swelling  . Tramadol Swelling  . Zoloft [Sertraline Hcl] Swelling    REVIEW OF SYSTEMS:   CONSTITUTIONAL: No fever, fatigue or weakness.  EYES: No blurred or double vision.  EARS, NOSE, AND THROAT: No tinnitus or ear pain.  RESPIRATORY: occasional cough present,no shortness of breath, no wheezing or hemoptysis.  CARDIOVASCULAR: Has chest pain,no orthopnea, edema.  GASTROINTESTINAL: No nausea, vomiting, diarrhea or abdominal pain.  GENITOURINARY: No dysuria, hematuria.  ENDOCRINE: No polyuria, nocturia,  HEMATOLOGY: No anemia, easy bruising or bleeding SKIN: No rash or lesion. MUSCULOSKELETAL: No joint pain or arthritis.   NEUROLOGIC: No tingling, numbness, weakness.  PSYCHIATRY: No anxiety or depression.   MEDICATIONS AT HOME:  Prior to Admission medications   Medication Sig Start Date End Date Taking? Authorizing Provider  acetaminophen-codeine (TYLENOL #3) 300-30 MG tablet Take 1 tablet by mouth every 4 (four) hours as needed for moderate pain. Patient not taking: Reported on 08/02/2015 05/31/15   Gregor Hams, MD  acetaminophen-codeine (TYLENOL #3) 300-30 MG tablet Take 1 tablet by mouth every 6 (six) hours as needed for moderate pain. Patient not taking: Reported on 08/02/2015 06/08/15   Gregor Hams, MD  albuterol (PROVENTIL HFA;VENTOLIN HFA) 108 (90 BASE) MCG/ACT inhaler Inhale 2 puffs into the lungs every  4 (four) hours as needed for wheezing or shortness of breath. Patient not taking: Reported on 08/02/2015 05/31/15   Gregor Hams, MD  ALPRAZolam Duanne Moron) 0.5 MG tablet Take 0.5 mg by mouth 3 (three) times daily as needed for anxiety. Reported on 08/02/2015    Historical Provider, MD  atorvastatin (LIPITOR) 20 MG tablet Take 1 tablet (20 mg total) by mouth daily. Patient not taking: Reported on 08/02/2015 05/31/15   Gregor Hams, MD  budesonide-formoterol [facility]) 160-4.5 MCG/ACT inhaler Inhale 2 puffs into the lungs 2 (two) times daily. Patient not taking: Reported on 08/02/2015 05/31/15   Gregor Hams, MD  clonazePAM (KLONOPIN) 1 MG tablet Take 1 tablet by mouth 2 (two) times daily. Reported on 08/02/2015 11/09/14   Historical Provider, MD  clopidogrel (PLAVIX) 75 MG tablet Take 1 tablet (75 mg total) by mouth daily. Patient not taking: Reported on 08/02/2015 05/31/15   Gregor Hams, MD  DEXILANT 60 MG capsule Take 1 capsule by mouth daily. Reported on 08/02/2015 12/30/14   Historical Provider, MD  diazepam (VALIUM) 2 MG tablet Take 1 tablet (2 mg total) by mouth every 8 (eight) hours as needed for anxiety. Patient not taking: Reported on 08/02/2015 07/04/15   Charline Bills Cuthriell, [name]  gabapentin (NEURONTIN) 300 MG capsule Take 2 capsules (600 mg total) by mouth 3 (three) times daily. Patient not taking: Reported on 08/02/2015 05/31/15   Gregor Hams, MD  lisinopril (PRINIVIL,ZESTRIL) 20 MG tablet Take 1 tablet by mouth daily. Reported on 08/02/2015 12/30/14   Historical Provider, MD  metoprolol tartrate (LOPRESSOR) 25 MG tablet Take 1 tablet (25 mg total) by mouth 2 (two) times daily. Patient not taking: Reported on 08/02/2015 05/31/15   Gregor Hams, MD  naproxen (NAPROSYN) 500 MG tablet Take 1 tablet (500 mg total) by mouth 2 (two) times daily with a meal. Patient not taking: Reported on 08/02/2015 07/04/15   Charline Bills Cuthriell, [name]  nicotine (NICODERM CQ - DOSED IN MG/24 HOURS) 14 mg/24hr patch  Place 1 patch (14 mg total) onto the skin daily. Patient not taking: Reported on 08/02/2015 06/08/15   Gregor Hams, MD  ondansetron (ZOFRAN-ODT) 8 MG disintegrating tablet Take 1 tablet by mouth every 8 (eight) hours as needed for nausea or vomiting. Reported on 08/02/2015 11/09/14   Historical Provider, MD  potassium chloride (K-DUR,KLOR-CON) 10 MEQ tablet Take 1 tablet by mouth daily. Reported on 08/02/2015 12/21/14   Historical Provider, MD  RANEXA 500 MG 12 hr tablet Take 1 tablet (500 mg total) by mouth 2 (two) times daily. Patient not taking: Reported on 08/02/2015 05/31/15   Gregor Hams, MD  zolpidem (AMBIEN) 10 MG tablet Take 1 tablet by mouth daily. Reported on 08/02/2015 11/09/14   Historical Provider, MD      PHYSICAL EXAMINATION:   VITAL SIGNS: Blood pressure 142/107, pulse 76, temperature 98.1 F (36.7 C), temperature source Oral, resp. rate 17, height 5\' 4"  (1.626 m), weight 78.926 kg (174 lb), last menstrual  period 07/26/2015, SpO2 97 %.  GENERAL:  50 y.o.-year-old patient lying in the bed with no acute distress.  EYES: Pupils equal, round, reactive to light and accommodation. No scleral icterus. Extraocular muscles intact.  HEENT: Head atraumatic, normocephalic. Oropharynx and nasopharynx clear.  NECK:  Supple, no jugular venous distention. No thyroid enlargement, no tenderness.  LUNGS: Normal breath sounds bilaterally, no wheezing, rales,rhonchi or crepitation. No use of accessory muscles of respiration.  CARDIOVASCULAR: S1, S2 normal. No murmurs, rubs, or gallops.  ABDOMEN: Soft, nontender, nondistended. Bowel sounds present. No organomegaly or mass.  EXTREMITIES: No pedal edema, cyanosis, or clubbing.  NEUROLOGIC: Cranial nerves II through XII are intact. Muscle strength 5/5 in all extremities. Sensation intact. Gait normal. PSYCHIATRIC: The patient is alert and oriented x 3.  SKIN: No obvious rash, lesion, or ulcer.   LABORATORY PANEL:   CBC  Recent Labs Lab  08/01/15 1953  WBC 8.3  HGB 14.2  HCT 42.5  PLT 240  MCV 84.7  MCH 28.4  MCHC 33.5  RDW 17.7*   ------------------------------------------------------------------------------------------------------------------  Chemistries   Recent Labs Lab 08/01/15 1953  NA 140  K 3.3*  CL 106  CO2 25  GLUCOSE 93  BUN 6  CREATININE 0.80  CALCIUM 9.0  AST 81*  ALT 87*  ALKPHOS 81  BILITOT 0.2*   ------------------------------------------------------------------------------------------------------------------ estimated creatinine clearance is 88.4 mL/min (by C-G formula based on Cr of 0.8). ------------------------------------------------------------------------------------------------------------------ No results for input(s): TSH, T4TOTAL, T3FREE, THYROIDAB in the last 72 hours.  Invalid input(s): FREET3   Coagulation profile No results for input(s): INR, PROTIME in the last 168 hours. ------------------------------------------------------------------------------------------------------------------- No results for input(s): DDIMER in the last 72 hours. -------------------------------------------------------------------------------------------------------------------  Cardiac Enzymes  Recent Labs Lab 08/01/15 1953  TROPONINI <0.03   ------------------------------------------------------------------------------------------------------------------ Invalid input(s): POCBNP  ---------------------------------------------------------------------------------------------------------------  Urinalysis    Component Value Date/Time   COLORURINE YELLOW* 08/02/2015 0002   APPEARANCEUR HAZY* 08/02/2015 0002   LABSPEC 1.024 08/02/2015 0002   PHURINE 5.0 08/02/2015 0002   GLUCOSEU NEGATIVE 08/02/2015 0002   HGBUR NEGATIVE 08/02/2015 0002   BILIRUBINUR NEGATIVE 08/02/2015 0002   KETONESUR NEGATIVE 08/02/2015 0002   PROTEINUR 30* 08/02/2015 0002   NITRITE POSITIVE* 08/02/2015 0002    LEUKOCYTESUR 2+* 08/02/2015 0002     RADIOLOGY: Dg Chest 2 View  08/01/2015  CLINICAL DATA:  Initial evaluation for acute chest pain. EXAM: CHEST  2 VIEW COMPARISON:  Prior study from 06/07/2015. FINDINGS: The cardiac and mediastinal silhouettes are stable in size and contour, and remain within normal limits. The lungs are normally inflated. Minimal diffuse peribronchial thickening, similar to previous. No airspace consolidation, pleural effusion, or pulmonary edema is identified. There is no pneumothorax. No acute osseous abnormality identified. IMPRESSION: 1. No active cardiopulmonary disease. 2. Mild diffuse bronchitic changes, similar to prior, and likely chronic. Electronically Signed   By: Jeannine Boga M.D.   On: 08/01/2015 20:28    EKG: Orders placed or performed during the hospital encounter of 08/01/15  . ED EKG  . ED EKG  . EKG 12-Lead  . EKG 12-Lead    IMPRESSION AND PLAN: 50 year old female patient with history of coronary artery disease, COPD, hypertension, hyperlipidemia presented to the emergency room with chest pain and elevated blood pressure. Patient is out of medication the last 2 months. Admitting diagnosis 1. Chest pain rule out MI 2. Uncontrolled hypertension 3. Hypokalemia 4. Coronary artery disease Treatment plan Admit patient to telemetry under observation bed Control blood pressure with oral metoprolol and IV hydralazine as  needed Aspirin 81 mg daily Plavix 75 mg daily Cardiology consultation Check echocardiogram and cycle troponin to rule out ischemia Replace potassium.  All the records are reviewed and case discussed with ED provider. Management plans discussed with the patient, family and they are in agreement.  CODE STATUS:FULL Code Status History    This patient does not have a recorded code status. Please follow your organizational policy for patients in this situation.       TOTAL TIME TAKING CARE OF THIS PATIENT: 51 minutes.     Saundra Shelling M.D on 08/02/2015 at 3:06 AM  Between 7am to 6pm - Pager - [phone]  After 6pm go to [email or web address] - password [email or web address] [facility]  Office  [phone]  CC: Primary care physician; No PCP Per Patient

## 2015-08-02 NOTE — Progress Notes (Signed)
Morphine 2mg  iv given for complaints of pain 9/10, will monitor.

## 2015-08-02 NOTE — Progress Notes (Signed)
CSW discussed patient with RN Case Manager, Nann about patient needing medication assistance. RN Case Manager will provide patient with medication assistance. There are no CSW needs at this time.  CSW will be avalible if a need were to arise. CSW is signing off.   Sara Munoz, MSW, [place] Social Work Department [phone]

## 2015-08-02 NOTE — ED Provider Notes (Signed)
[facility] Emergency Department Provider Note  ____________________________________________  Time seen: Approximately 12:07 AM  I have reviewed the triage vital signs and the nursing notes.   HISTORY  Chief Complaint Chest Pain and Cough    HPI Sara Munoz is a 50 y.o. female who reports a history of CAD status post prior MI and reportedly has a least one stent, a history of CVA, hypertension, and "other issues" who presents with several days of worsening chest pain.  She describes it initially as episodic but now constant.  It is severe, feels like a pressure or heaviness in the middle of her chest, sometimes takes her breath away, and radiates down the left arm.  Nothing makes it better and exertion make it worse.  It feels similar to her prior MI. She states that she has not had any of her medications for months since she moved to this area.  She does not have a regular doctor.  She denies fever/chills, abdominal pain, nausea/vomiting, dysuria.  She continues to smoke cigarettes.   Past Medical History  Diagnosis Date  . Hypertension   . Coronary artery disease   . COPD (chronic obstructive pulmonary disease) ([place])   . COPD (chronic obstructive pulmonary disease) ([place])   . Hypercholesteremia   . Chronic back pain   . MI (myocardial infarction) ([place])     There are no active problems to display for this patient.   Past Surgical History  Procedure Laterality Date  . Orif femur fracture    . Orif tibia & fibula fractures    . Hemorroids    . Ovary surgery      Current Outpatient Rx  Name  Route  Sig  Dispense  Refill  . acetaminophen-codeine (TYLENOL #3) 300-30 MG tablet   Oral   Take 1 tablet by mouth every 4 (four) hours as needed for moderate pain. Patient not taking: Reported on 08/02/2015   15 tablet   0   . acetaminophen-codeine (TYLENOL #3) 300-30 MG tablet   Oral   Take 1 tablet by mouth every 6 (six) hours as needed for moderate  pain. Patient not taking: Reported on 08/02/2015   20 tablet   0   . albuterol (PROVENTIL HFA;VENTOLIN HFA) 108 (90 BASE) MCG/ACT inhaler   Inhalation   Inhale 2 puffs into the lungs every 4 (four) hours as needed for wheezing or shortness of breath. Patient not taking: Reported on 08/02/2015   1 Inhaler   0   . ALPRAZolam (XANAX) 0.5 MG tablet   Oral   Take 0.5 mg by mouth 3 (three) times daily as needed for anxiety. Reported on 08/02/2015         . atorvastatin (LIPITOR) 20 MG tablet   Oral   Take 1 tablet (20 mg total) by mouth daily. Patient not taking: Reported on 08/02/2015   30 tablet   0   . budesonide-formoterol (SYMBICORT) 160-4.5 MCG/ACT inhaler   Inhalation   Inhale 2 puffs into the lungs 2 (two) times daily. Patient not taking: Reported on 08/02/2015   1 Inhaler   0   . clonazePAM (KLONOPIN) 1 MG tablet   Oral   Take 1 tablet by mouth 2 (two) times daily. Reported on 08/02/2015      0   . clopidogrel (PLAVIX) 75 MG tablet   Oral   Take 1 tablet (75 mg total) by mouth daily. Patient not taking: Reported on 08/02/2015   30 tablet   0   .  DEXILANT 60 MG capsule   Oral   Take 1 capsule by mouth daily. Reported on 08/02/2015      0     Dispense as written.   . diazepam (VALIUM) 2 MG tablet   Oral   Take 1 tablet (2 mg total) by mouth every 8 (eight) hours as needed for anxiety. Patient not taking: Reported on 08/02/2015   15 tablet   0   . gabapentin (NEURONTIN) 300 MG capsule   Oral   Take 2 capsules (600 mg total) by mouth 3 (three) times daily. Patient not taking: Reported on 08/02/2015   30 capsule   0   . lisinopril (PRINIVIL,ZESTRIL) 20 MG tablet   Oral   Take 1 tablet by mouth daily. Reported on 08/02/2015      5   . metoprolol tartrate (LOPRESSOR) 25 MG tablet   Oral   Take 1 tablet (25 mg total) by mouth 2 (two) times daily. Patient not taking: Reported on 08/02/2015   60 tablet   0   . naproxen (NAPROSYN) 500 MG tablet   Oral   Take 1 tablet  (500 mg total) by mouth 2 (two) times daily with a meal. Patient not taking: Reported on 08/02/2015   60 tablet   0   . nicotine (NICODERM CQ - DOSED IN MG/24 HOURS) 14 mg/24hr patch   Transdermal   Place 1 patch (14 mg total) onto the skin daily. Patient not taking: Reported on 08/02/2015   30 patch   0   . ondansetron (ZOFRAN-ODT) 8 MG disintegrating tablet   Oral   Take 1 tablet by mouth every 8 (eight) hours as needed for nausea or vomiting. Reported on 08/02/2015      0   . potassium chloride (K-DUR,KLOR-CON) 10 MEQ tablet   Oral   Take 1 tablet by mouth daily. Reported on 08/02/2015      0   . RANEXA 500 MG 12 hr tablet   Oral   Take 1 tablet (500 mg total) by mouth 2 (two) times daily. Patient not taking: Reported on 08/02/2015   60 tablet   0     Dispense as written.   . zolpidem (AMBIEN) 10 MG tablet   Oral   Take 1 tablet by mouth daily. Reported on 08/02/2015      0     Allergies Flexeril; Keflex; Levaquin; Toradol; Tramadol; and Zoloft  History reviewed. No pertinent family history.  Social History Social History  Substance Use Topics  . Smoking status: Current Every Day Smoker -- 1.00 packs/day    Types: Cigarettes  . Smokeless tobacco: Never Used  . Alcohol Use: Yes    Review of Systems Constitutional: No fever/chills Eyes: No visual changes. ENT: No sore throat. Cardiovascular: Severe central chest pressure radiating down left arm Respiratory: Some shortness of breath associated with the chest pain Gastrointestinal: No abdominal pain.  No nausea, no vomiting.  No diarrhea.  No constipation. Genitourinary: Negative for dysuria. Musculoskeletal: Negative for back pain. Skin: Negative for rash. Neurological: Negative for headaches, focal weakness or numbness.  10-point ROS otherwise negative.  ____________________________________________   PHYSICAL EXAM:  VITAL SIGNS: ED Triage Vitals  Enc Vitals Group     BP 08/01/15 1938 166/132 mmHg      Pulse Rate 08/01/15 1938 97     Resp 08/01/15 1938 18     Temp 08/01/15 1938 98.1 F (36.7 C)     Temp Source 08/01/15 1938 Oral  SpO2 08/01/15 1933 95 %     Weight 08/01/15 1938 174 lb (78.926 kg)     Height 08/01/15 1938 5\' 4"  (1.626 m)     Head Cir --      Peak Flow --      Pain Score 08/01/15 1939 10     Pain Loc --      Pain Edu? --      Excl. in [place]? --     Constitutional: Alert and oriented.  Nontoxic appearing but does appear somewhat uncomfortable. Eyes: Conjunctivae are normal. PERRL. EOMI. Head: Atraumatic. Nose: No congestion/rhinnorhea. Mouth/Throat: Mucous membranes are moist.  Oropharynx non-erythematous. Neck: No stridor.   Cardiovascular: Normal rate, regular rhythm. Grossly normal heart sounds.  Good peripheral circulation. Respiratory: Normal respiratory effort.  No retractions. Lungs CTAB. Gastrointestinal: Soft and nontender. No distention. No abdominal bruits. No CVA tenderness. Musculoskeletal: No lower extremity tenderness nor edema.  No joint effusions. Neurologic:  Normal speech and language. No gross focal neurologic deficits are appreciated.  Skin:  Skin is warm, dry and intact. No rash noted. Psychiatric: Mood and affect are normal. Speech and behavior are normal.  ____________________________________________   LABS (all labs ordered are listed, but only abnormal results are displayed)  Labs Reviewed  CBC - Abnormal; Notable for the following:    RDW 17.7 (*)    All other components within normal limits  COMPREHENSIVE METABOLIC PANEL - Abnormal; Notable for the following:    Potassium 3.3 (*)    AST 81 (*)    ALT 87 (*)    Total Bilirubin 0.2 (*)    All other components within normal limits  URINALYSIS COMPLETEWITH MICROSCOPIC ([facility] ONLY) - Abnormal; Notable for the following:    Color, Urine YELLOW (*)    APPearance HAZY (*)    Protein, ur 30 (*)    Nitrite POSITIVE (*)    Leukocytes, UA 2+ (*)    Bacteria, UA MANY (*)    Squamous  Epithelial / LPF 6-30 (*)    All other components within normal limits  TROPONIN I  BRAIN NATRIURETIC PEPTIDE   ____________________________________________  EKG  ED ECG REPORT I, Jase Reep, the attending physician, personally viewed and interpreted this ECG.  Date: 08/01/2015 EKG Time:  Rate: 95 Rhythm: normal sinus rhythm QRS Axis: normal Intervals: normal ST/T Wave abnormalities: normal Conduction Disturbances: none Narrative Interpretation: unremarkable  ____________________________________________  RADIOLOGY   Dg Chest 2 View  08/01/2015  CLINICAL DATA:  Initial evaluation for acute chest pain. EXAM: CHEST  2 VIEW COMPARISON:  Prior study from 06/07/2015. FINDINGS: The cardiac and mediastinal silhouettes are stable in size and contour, and remain within normal limits. The lungs are normally inflated. Minimal diffuse peribronchial thickening, similar to previous. No airspace consolidation, pleural effusion, or pulmonary edema is identified. There is no pneumothorax. No acute osseous abnormality identified. IMPRESSION: 1. No active cardiopulmonary disease. 2. Mild diffuse bronchitic changes, similar to prior, and likely chronic. Electronically Signed   By: Jeannine Boga M.D.   On: 08/01/2015 20:28    ____________________________________________   PROCEDURES  Procedure(s) performed: None  Critical Care performed: No ____________________________________________   INITIAL IMPRESSION / ASSESSMENT AND PLAN / ED COURSE  Pertinent labs & imaging results that were available during my care of the patient were reviewed by me and considered in my medical decision making (see chart for details).  The patient has uncontrolled hypertension which may be causing or contributing to her chest pain.  Her diastolic pressures are  consistently in the 130s.  Her labs are unremarkable as is her chest x-ray but given the hypertensive urgency and high risk chest pain I feel she  needs to come into the hospital for cycling enzymes and management of her blood pressure.  I have given her a full dose aspirin as well as labetalol 10 mg IV in the emergency department (she previously was on atenolol).  I spoke with the patient who agrees with the plan and the hospitalist who will admit.  ____________________________________________  FINAL CLINICAL IMPRESSION(S) / ED DIAGNOSES  Final diagnoses:  Chest pain, unspecified chest pain type  Hypertensive urgency      NEW MEDICATIONS STARTED DURING THIS VISIT:  New Prescriptions   No medications on file     Hinda Kehr, MD 08/02/15 0225

## 2015-08-02 NOTE — Consult Note (Signed)
Cardiology Consult    Patient ID: Sara MOLDENHAUER MRN: [id number], DOB/AGE: 1965-09-15   Admit date: 08/01/2015 Date of Consult: 08/02/2015  Primary Physician: No PCP Per Patient Reason for Consult: Chest Pain Primary Cardiologist: New to [facility] Requesting Provider: Dr. Estanislado Pandy   History of Present Illness    Sara Munoz is a 50 y.o. female with past medical history of mild-nonobstructive CAD (by cath in 06/2014), HTN, HLD, chronic back pain, and COPD who presented to [facility] on 08/01/2015 for evaluation of chest pain.  The patient reports having worsening chest pressure for the past 3 days which feels like someone is sitting on her chest. The pain has been constant for the past 3 days and and is relieved with the IV Morphine she has received while admitted. The pain is made slightly worse by coughing. There is not an exertional component. She says the pain is associated with dyspnea and radiating pain up to her jaw and down her left arm. She says she has actually had the chest discomfort for the past several years and over the past year she has only been able to go 3-4 days without pain. During this encounter, she says her current pain level is an 8/10 and was previously a 10/10 until she received her IV Morphine earlier this morning.  She was followed by a Pain Management clinic in [facility] before moving to [place] 4 months ago. Since relocating, she has not established care with a new Pain Management clinic and has been unable to obtain her medications. In regards to her CAD, she was followed by Dr. Lissa Merlin in [facility] but again has not re-established care with a Cardiologist since moving. She has not taken her ASA, Plavix, statin, Ranexa, BB, or ACE-I for the past 4 months.   She initially reported a history of "several cardiac catheterization and possible stents". In reviewing her records she had a recent cardiac catheterization in 06/2014 which showed mild-nonobstructive CAD with 10-20%  stenosis in the Left Main, 25% stenosis LAD,  30-40% LCx, and 20-30% stenosis in the RCA. The note makes a comparison to her cath in 2011 saying there are no significant changes.  This admission, her cyclic troponin values have been negative. EKG shows no acute ST or T-wave changes. BMET showed K+ of 3.0 and this has been appropriately replaced. UA was positive for nitrites and leukocytes. CBC showed no abnormalities. CXR showed no active cardiopulmonary disease.   Past Medical History   Past Medical History  Diagnosis Date  . Hypertension   . Coronary artery disease   . COPD (chronic obstructive pulmonary disease) ([place])   . COPD (chronic obstructive pulmonary disease) ([place])   . Hypercholesteremia   . Chronic back pain   . MI (myocardial infarction) [facility])     Past Surgical History  Procedure Laterality Date  . Orif femur fracture    . Orif tibia & fibula fractures    . Hemorroids    . Ovary surgery       Allergies  Allergies  Allergen Reactions  . Flexeril [Cyclobenzaprine] Swelling  . Keflex [Cephalexin] Hives  . Levaquin [Levofloxacin In D5w] Hives  . Toradol [Ketorolac Tromethamine] Swelling  . Tramadol Swelling  . Zoloft [Sertraline Hcl] Swelling    Inpatient Medications    . [START ON 08/03/2015] aspirin EC  81 mg Oral Daily  . atorvastatin  20 mg Oral Daily  . budesonide-formoterol  2 puff Inhalation BID  . clonazePAM  1 mg  Oral BID  . clopidogrel  75 mg Oral Daily  . enoxaparin (LOVENOX) injection  40 mg Subcutaneous Q24H  . gabapentin  600 mg Oral TID  . lisinopril  20 mg Oral Daily  . magnesium sulfate 1 - 4 g bolus IVPB  2 g Intravenous Once  . metoprolol tartrate  50 mg Oral BID  . nicotine  14 mg Transdermal Q24H  . pantoprazole  40 mg Oral Daily  . ranolazine  500 mg Oral BID  . sodium chloride flush  3 mL Intravenous Q12H  . sulfamethoxazole-trimethoprim  0.5 tablet Oral Q12H  . zolpidem  10 mg Oral Daily    Family History    Family History    Problem Relation Age of Onset  . Heart disease Mother   . Lung cancer Mother   . Ovarian cancer Mother     Social History    Social History   Social History  . Marital Status: Single    Spouse Name: N/A  . Number of Children: N/A  . Years of Education: N/A   Occupational History  . disabled    Social History Main Topics  . Smoking status: Current Every Day Smoker -- 1.00 packs/day    Types: Cigarettes  . Smokeless tobacco: Never Used  . Alcohol Use: 0.0 oz/week    0 Standard drinks or equivalent per week     Comment: drinks occasionally  . Drug Use: No  . Sexual Activity: Yes   Other Topics Concern  . Not on file   Social History Narrative   Moved from [place].     Review of Systems    General:  No chills, fever, night sweats or weight changes.  Cardiovascular:  No dyspnea on exertion, edema, orthopnea, palpitations, paroxysmal nocturnal dyspnea. Positive for chest pain. Dermatological: No rash, lesions/masses Respiratory: No cough, dyspnea Urologic: No hematuria, dysuria Abdominal:   No nausea, vomiting, diarrhea, bright red blood per rectum, melena, or hematemesis Neurologic:  No visual changes, wkns, changes in mental status. Musculoskeletal: Positive for lower back pain. All other systems reviewed and are otherwise negative except as noted above.  Physical Exam    Blood pressure 137/65, pulse 100, temperature 98.2 F (36.8 C), temperature source Oral, resp. rate 17, height 5\' 4"  (1.626 m), weight 178 lb 9.6 oz (81.012 kg), last menstrual period 07/26/2015, SpO2 97 %.  General: Pleasant, Caucasian female appearing in NAD Psych: Normal affect. Neuro: Alert and oriented X 3. Moves all extremities spontaneously. HEENT: Normal  Neck: Supple without bruits or JVD. Lungs:  Resp regular and unlabored, CTA without wheezing or rales. Heart: RRR no s3, s4, or murmurs. Tender to palpation along left pectoral region and sternum. Abdomen: Soft, non-tender,  non-distended, BS + x 4.  Extremities: No clubbing, cyanosis or edema. DP/PT/Radials 2+ and equal bilaterally.  Labs    Troponin (Point of Care Test) No results for input(s): TROPIPOC in the last 72 hours.  Recent Labs  08/01/15 1953 08/02/15 0606  TROPONINI <0.03 <0.03   Lab Results  Component Value Date   WBC 6.7 08/02/2015   HGB 12.8 08/02/2015   HCT 38.0 08/02/2015   MCV 84.4 08/02/2015   PLT 211 08/02/2015    Recent Labs Lab 08/01/15 1953 08/02/15 0606  NA 140 135  K 3.3* 3.0*  CL 106 104  CO2 25 22  BUN 6 6  CREATININE 0.80 0.82  CALCIUM 9.0 8.3*  PROT 7.1  --   BILITOT 0.2*  --   ALKPHOS  81  --   ALT 87*  --   AST 81*  --   GLUCOSE 93 104*   Lab Results  Component Value Date   CHOL 149 08/02/2015   HDL 28* 08/02/2015   LDLCALC 97 08/02/2015   TRIG 118 08/02/2015   No results found for: [facility]   Radiology Studies    Dg Chest 2 View: 08/01/2015  CLINICAL DATA:  Initial evaluation for acute chest pain. EXAM: CHEST  2 VIEW COMPARISON:  Prior study from 06/07/2015. FINDINGS: The cardiac and mediastinal silhouettes are stable in size and contour, and remain within normal limits. The lungs are normally inflated. Minimal diffuse peribronchial thickening, similar to previous. No airspace consolidation, pleural effusion, or pulmonary edema is identified. There is no pneumothorax. No acute osseous abnormality identified. IMPRESSION: 1. No active cardiopulmonary disease. 2. Mild diffuse bronchitic changes, similar to prior, and likely chronic. Electronically Signed   By: Jeannine Boga M.D.   On: 08/01/2015 20:28    EKG & Cardiac Imaging    EKG: NSR, HR 95. No acute ST or T-wave changes.  Echocardiogram: Pending  Cardiac Catheterization: 07/07/2014  Aortic Pressure: 120/73, mean 94 Left Ventricular Pressure: 140/9. There was no aortic valve gradient at the time of catheter pullback.  Left Ventriculography: Normal chamber size. Frequent ectopic beats.  Contractility is normal. EF 65%. No significant MR   Coronary Angiography: Left Main: Normal caliber segment. Focal eccentric mid 10-20 percent plaque. The left main divides into the LAD, LCx LAD: Medium caliber vessel. The proximal and mid segments have some 25% irregular plaque. The distal vessel is normal. 2 medium caliber diagonal branches arise from the proximal third of the LAD and have minor wall irregularities. LCX: Dominant vessel. The proximal segment is normal. It provides OM1. It has ostial and proximal 30-40 percent narrowing. The mid vessel provides OM 2 which is normal. The distal PDA branch has minor luminal irregularities. RCA: Small caliber nondominant vessel. It does not appear to supply left ventricular myocardium. There is a focal eccentric 20-30 percent mid vessel plaque.  Impression:  Left dominant coronary anatomy  Angiographic mild nonobstructive coronary atherosclerosis. No significant changes compared with her last study 07/28/2009.  Normal left ventricular size and contractility, EF 65%   Assessment & Plan     1. Atypical Chest Pain - low risk for cardiac etiology - reports having chronic chest pain for the past year, worse over the past 4 months since she has been unable to refill her pain medications due to relocating to [place]. The pain is slightly relieved with IV Morphine, no change in her pain with SL NTG.  - the pain is reproducible with palpation and pleuritic in nature, present at rest and unchanged with activity.  - cyclic troponin values have been negative and her EKG is without acute ischemic changes - cath in 06/2014 showed mild-nonobstructive disease with less than 40% stenosis. Refer to the full cath report above.  - would not pursue further ischemic evaluation at this time.  2. Mild-Nonobstructive CAD - cardiac catheterization in 06/2014 showed mild-nonobstructive CAD with 10-20% stenosis in the Left Main, 25% stenosis LAD,  30-40% LCx, and  20-30% stenosis in the RCA. The note makes a comparison to her cath in 2011 saying there are no significant changes. - was on Plavix prior to admission (not taking for the past 4 months). This could likely be replaced with 81mg  ASA as she does not have a history of PCI.  - continue BB, Ranexa,  and statin.    3. HTN - SBP was elevated in the 180's - 190's upon arrival. - Improved to the 110 -130's upon being restarted on her medications.  - continue Lisinopril 20mg  daily and Lopressor 50mg  BID.  4. HLD - continue statin therapy  5. Chronic Back Pain  - patient reports a history of chronic back pain and was followed by a pain clinic in [facility] where she received MS Contin 30mg  TID. - She is requesting to have this an an outpatient. Informed the patient she needs to establish care with a PCP and Pain Management clinic here in the area for evaluation and management of her back pain.   Signed, Erma Heritage, [name] 08/02/2015, 10:39 AM Pager: [phone]  I have seen and evaluated the patient along with Ms. Ahmed Prima, [name]. Together we reviewed her history including the care everywhere notes from [facility] indicating cardiac catheterization with no significant CAD.  She presents with a several-day history of diffuse chest pain that starts in this parasternal area rating down around underneath her left breast. To her jaw. It is reproducible on palpation. Has ruled out for MI. Based on her prior cardiac evaluation, this is probably low likelihood of being cardiac in nature. Most consistent with costochondritis.  She also has chronic pain and may be difficult to treat. For cost enteritis would consider taper of high-dose NSAIDs versus prednisone. Prednisone may be more beneficial in the setting of her underlying COPD.  I don't see any clear indication for Plavix plus aspirin. Would probably DC Plavix, but continue other medications including beta blocker, statin. Ranexa may be for  microvascular ischemia /vasospasm.   Her blood pressure is actually normalized now as she's been back on her home medications.  No further cardiac evaluation is indicated.    Leonie Man, M.D., M.S.  [employer]  [address] [place], [place] [address] [phone] Fax [phone]

## 2015-08-02 NOTE — Progress Notes (Signed)
Pt asking to speak with Dr. Bridgett Larsson re: pain medication. Page out to him

## 2015-08-02 NOTE — Progress Notes (Addendum)
[facility] [place] NAME: Sara Munoz    MR#:  [id number]  DATE OF BIRTH:  [birth year]  SUBJECTIVE:  CHIEF COMPLAINT:   Chief Complaint  Patient presents with  . Chest Pain  . Cough   Still chest pain.  REVIEW OF SYSTEMS:  CONSTITUTIONAL: No fever, fatigue or weakness.  EYES: No blurred or double vision.  EARS, NOSE, AND THROAT: No tinnitus or ear pain.  RESPIRATORY: No cough, shortness of breath, wheezing or hemoptysis.  CARDIOVASCULAR: has chest pain, no orthopnea, edema.  GASTROINTESTINAL: No nausea, vomiting, diarrhea or abdominal pain.  GENITOURINARY: No dysuria, hematuria.  ENDOCRINE: No polyuria, nocturia,  HEMATOLOGY: No anemia, easy bruising or bleeding SKIN: No rash or lesion. MUSCULOSKELETAL: No joint pain or arthritis.   NEUROLOGIC: No tingling, numbness, weakness.  PSYCHIATRY: has anxiety but no depression.   DRUG ALLERGIES:   Allergies  Allergen Reactions  . Flexeril [Cyclobenzaprine] Swelling  . Keflex [Cephalexin] Hives  . Levaquin [Levofloxacin In D5w] Hives  . Toradol [Ketorolac Tromethamine] Swelling  . Tramadol Swelling  . Zoloft [Sertraline Hcl] Swelling    VITALS:  Blood pressure 103/57, pulse 94, temperature 98.3 F (36.8 C), temperature source Oral, resp. rate 17, height 5\' 4"  (1.626 m), weight 81.012 kg (178 lb 9.6 oz), last menstrual period 07/26/2015, SpO2 97 %.  PHYSICAL EXAMINATION:  GENERAL:  50 y.o.-year-old patient lying in the bed with no acute distress.  EYES: Pupils equal, round, reactive to light and accommodation. No scleral icterus. Extraocular muscles intact.  HEENT: Head atraumatic, normocephalic. Oropharynx and nasopharynx clear.  NECK:  Supple, no jugular venous distention. No thyroid enlargement, no tenderness.  LUNGS: Normal breath sounds bilaterally, no wheezing, rales,rhonchi or crepitation. No use of accessory muscles of respiration.  CARDIOVASCULAR: S1, S2 normal. No  murmurs, rubs, or gallops.  ABDOMEN: Soft, nontender, nondistended. Bowel sounds present. No organomegaly or mass.  EXTREMITIES: No pedal edema, cyanosis, or clubbing.  NEUROLOGIC: Cranial nerves II through XII are intact. Muscle strength 5/5 in all extremities. Sensation intact. Gait not checked.  PSYCHIATRIC: The patient is alert and oriented x 3.  SKIN: No obvious rash, lesion, or ulcer.    LABORATORY PANEL:   CBC  Recent Labs Lab 08/02/15 0606  WBC 6.7  HGB 12.8  HCT 38.0  PLT 211   ------------------------------------------------------------------------------------------------------------------  Chemistries   Recent Labs Lab 08/01/15 1953 08/02/15 0606  NA 140 135  K 3.3* 3.0*  CL 106 104  CO2 25 22  GLUCOSE 93 104*  BUN 6 6  CREATININE 0.80 0.82  CALCIUM 9.0 8.3*  MG  --  1.6*  AST 81*  --   ALT 87*  --   ALKPHOS 81  --   BILITOT 0.2*  --    ------------------------------------------------------------------------------------------------------------------  Cardiac Enzymes  Recent Labs Lab 08/02/15 1356  TROPONINI <0.03   ------------------------------------------------------------------------------------------------------------------  RADIOLOGY:  Dg Chest 2 View  08/01/2015  CLINICAL DATA:  Initial evaluation for acute chest pain. EXAM: CHEST  2 VIEW COMPARISON:  Prior study from 06/07/2015. FINDINGS: The cardiac and mediastinal silhouettes are stable in size and contour, and remain within normal limits. The lungs are normally inflated. Minimal diffuse peribronchial thickening, similar to previous. No airspace consolidation, pleural effusion, or pulmonary edema is identified. There is no pneumothorax. No acute osseous abnormality identified. IMPRESSION: 1. No active cardiopulmonary disease. 2. Mild diffuse bronchitic changes, similar to prior, and likely chronic. Electronically Signed   By: Pincus Badder.D.  On: 08/01/2015 20:28    EKG:    Orders placed or performed during the hospital encounter of 08/01/15  . ED EKG  . ED EKG  . EKG 12-Lead  . EKG 12-Lead    ASSESSMENT AND PLAN:    1. Atypical chest pain, Coronary artery disease,  ruled out MI Continue Aspirin 81 mg daily, continue BB, Ranexa, and statin.but discontinue Plavix (no PCI) per Dr. Ellyn Hack.  Would not pursue further ischemic evaluation at this time.   2. Uncontrolled hypertension. Continue lisinopril and Lopressor. 3. Hypokalemia. Replaced potassium. * Hypomagnesemia. Replaced magnesium supplement. 4. chronic pain. The patient requested morphine and oxycodone.  give oxycondone prn.  5. Tobacco abuse. Smoking cessation was counseled for 3-4 minutes.   All the records are reviewed and case discussed with Care Management/Social Workerr. Management plans discussed with the patient, family and they are in agreement.  CODE STATUS: Full code  TOTAL TIME TAKING CARE OF THIS PATIENT: 46 minutes.  Greater than 50% time was spent on coordination of care and face-to-face counseling.  POSSIBLE D/C IN 1-2 DAYS, DEPENDING ON CLINICAL CONDITION.   Demetrios Loll M.D on 08/02/2015 at 4:41 PM  Between 7am to 6pm - Pager - [phone]  After 6pm go to [email or web address] - password [email or web address] [facility]  [facility]  Office  [phone]  CC: Primary care physician; No PCP Per Patient

## 2015-08-02 NOTE — Progress Notes (Addendum)
Pt states chest pain 9/10 under lt breast, radiates to lt arm,face, neck.  Nitro 0.4mg  SL given

## 2015-08-03 LAB — BASIC METABOLIC PANEL
Anion gap: 4 — ABNORMAL LOW (ref 5–15)
BUN: 13 mg/dL (ref 6–20)
CO2: 25 mmol/L (ref 22–32)
Calcium: 8.9 mg/dL (ref 8.9–10.3)
Chloride: 109 mmol/L (ref 101–111)
Creatinine, Ser: 0.84 mg/dL (ref 0.44–1.00)
GFR calc Af Amer: >60 mL/min (ref >60)
GFR calc non Af Amer: >60 mL/min (ref >60)
Glucose, Bld: 98 mg/dL (ref 65–99)
Potassium: 4.2 mmol/L (ref 3.5–5.1)
Sodium: 138 mmol/L (ref 135–145)

## 2015-08-03 LAB — MAGNESIUM: Magnesium: 1.9 mg/dL (ref 1.7–2.4)

## 2015-08-03 MED ORDER — METOPROLOL TARTRATE 25 MG PO TABS: 25.0000 mg | ORAL_TABLET | Freq: Two times a day (BID) | ORAL | Status: DC

## 2015-08-03 MED ORDER — NITROGLYCERIN 0.4 MG SL SUBL: 0.4000 mg | SUBLINGUAL_TABLET | SUBLINGUAL | Status: DC | PRN

## 2015-08-03 MED ORDER — LISINOPRIL 20 MG PO TABS: 20.0000 mg | ORAL_TABLET | Freq: Every day | ORAL | Status: DC

## 2015-08-03 MED ORDER — DIAZEPAM 2 MG PO TABS: 2.0000 mg | ORAL_TABLET | Freq: Three times a day (TID) | ORAL | Status: DC | PRN

## 2015-08-03 MED ORDER — RANEXA 500 MG PO TB12: 500.0000 mg | ORAL_TABLET | Freq: Two times a day (BID) | ORAL | Status: DC

## 2015-08-03 MED ORDER — ALBUTEROL SULFATE HFA 108 (90 BASE) MCG/ACT IN AERS: 2.0000 | INHALATION_SPRAY | RESPIRATORY_TRACT | Status: DC | PRN

## 2015-08-03 MED ORDER — ASPIRIN 81 MG PO TBEC: 81.0000 mg | DELAYED_RELEASE_TABLET | Freq: Every day | ORAL | Status: DC

## 2015-08-03 MED ORDER — GABAPENTIN 300 MG PO CAPS: 600.0000 mg | ORAL_CAPSULE | Freq: Three times a day (TID) | ORAL | Status: DC

## 2015-08-03 MED ORDER — ATORVASTATIN CALCIUM 20 MG PO TABS: 20.0000 mg | ORAL_TABLET | Freq: Every day | ORAL | Status: DC

## 2015-08-03 NOTE — Discharge Instructions (Signed)
Heart healthy diet. °Activity as tolerated. °

## 2015-08-03 NOTE — Progress Notes (Signed)
Pt is a&o, VSS, NSR on tele with PRN meds given for chronic back pain. Order to d/c to home. MD informed of urine culture positive for gram (-) rods and no new orders placed. Discharge instructions and prescriptions given to pt with verbal acknowledgment of understanding. IV and tele removed and pt awaiting ride for d/c to home. Nursing will continue to assess.

## 2015-08-03 NOTE — Discharge Summary (Signed)
[place] [facility] [place] NAME: Sara Munoz    MR#:  [id number]  DATE OF BIRTH:  19-Nov-1965  DATE OF ADMISSION:  08/01/2015 ADMITTING PHYSICIAN: Saundra Shelling, MD  DATE OF DISCHARGE: 08/03/2015  2:07 PM  PRIMARY CARE PHYSICIAN: No PCP Per Patient    ADMISSION DIAGNOSIS:  Hypertensive urgency [I16.0] Chest pain, unspecified chest pain type [R07.9]   DISCHARGE DIAGNOSIS:  Atypical chest pain Accelerated hypertension SECONDARY DIAGNOSIS:   Past Medical History  Diagnosis Date  . Hypertension   . Coronary artery disease     a. Mild-non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/2014  . COPD (chronic obstructive pulmonary disease) ([place])   . Hypercholesteremia   . Chronic back pain   . MI (myocardial infarction) [facility])     HOSPITAL COURSE:   1. Atypical chest pain, Coronary artery disease, ruled out MI Continue Aspirin 81 mg daily, continue BB, Ranexa, and statin.but discontinue Plavix (no PCI) per Dr. Ellyn Hack. Would not pursue further ischemic evaluation at this time.   2. Accelerated hypertension. Controlled, Continue lisinopril and Lopressor. 3. Hypokalemia. Replaced potassium. Improved. * Hypomagnesemia. Replaced magnesium supplement. Improved. 4. chronic pain. The patient requested morphine and oxycodone. give oxycondone prn. She needs follow-up pain clinic.  5. Tobacco abuse. Smoking cessation was counseled for 3-4 minutes.   She has no urinary symptoms, but urine analysis showed WBC. She was treated with Bactrim, which was discontinued.  The patient hasn't taken any medication for several months. I gave part of her home medication prescription. But she needs follow-up PCP for evaluation of her medication list..  DISCHARGE CONDITIONS:   Stable, discharged to home today.  CONSULTS OBTAINED:     DRUG ALLERGIES:   Allergies  Allergen Reactions  . Flexeril [Cyclobenzaprine] Swelling  . Keflex [Cephalexin] Hives  . Levaquin  [Levofloxacin In D5w] Hives  . Toradol [Ketorolac Tromethamine] Swelling  . Tramadol Swelling  . Zoloft [Sertraline Hcl] Swelling    DISCHARGE MEDICATIONS:   Discharge Medication List as of 08/03/2015 10:42 AM    START taking these medications   Details  aspirin EC 81 MG EC tablet Take 1 tablet (81 mg total) by mouth daily., Starting 08/03/2015, Until Discontinued, Print    nitroGLYCERIN (NITROSTAT) 0.4 MG SL tablet Place 1 tablet (0.4 mg total) under the tongue every 5 (five) minutes x 3 doses as needed for chest pain., Starting 08/03/2015, Until Discontinued, Print      CONTINUE these medications which have CHANGED   Details  albuterol (PROVENTIL HFA;VENTOLIN HFA) 108 (90 Base) MCG/ACT inhaler Inhale 2 puffs into the lungs every 4 (four) hours as needed for wheezing or shortness of breath., Starting 08/03/2015, Until Discontinued, Print    atorvastatin (LIPITOR) 20 MG tablet Take 1 tablet (20 mg total) by mouth daily., Starting 08/03/2015, Until Discontinued, Print    diazepam (VALIUM) 2 MG tablet Take 1 tablet (2 mg total) by mouth every 8 (eight) hours as needed for anxiety., Starting 08/03/2015, Until Discontinued, Print    gabapentin (NEURONTIN) 300 MG capsule Take 2 capsules (600 mg total) by mouth 3 (three) times daily., Starting 08/03/2015, Until Discontinued, Print    lisinopril (PRINIVIL,ZESTRIL) 20 MG tablet Take 1 tablet (20 mg total) by mouth daily. Reported on 08/02/2015, Starting 08/03/2015, Until Discontinued, Print    metoprolol tartrate (LOPRESSOR) 25 MG tablet Take 1 tablet (25 mg total) by mouth 2 (two) times daily., Starting 08/03/2015, Until Discontinued, Print    RANEXA 500 MG 12 hr tablet  Take 1 tablet (500 mg total) by mouth 2 (two) times daily., Starting 08/03/2015, Until Discontinued, Print      CONTINUE these medications which have NOT CHANGED   Details  budesonide-formoterol (SYMBICORT) 160-4.5 MCG/ACT inhaler Inhale 2 puffs into the lungs 2 (two) times daily., Starting  05/31/2015, Until Discontinued, Print    clonazePAM (KLONOPIN) 1 MG tablet Take 1 tablet by mouth 2 (two) times daily. Reported on 08/02/2015, Starting 11/09/2014, Until Discontinued, Historical Med    DEXILANT 60 MG capsule Take 1 capsule by mouth daily. Reported on 08/02/2015, Starting 12/30/2014, Until Discontinued, Historical Med    nicotine (NICODERM CQ - DOSED IN MG/24 HOURS) 14 mg/24hr patch Place 1 patch (14 mg total) onto the skin daily., Starting 06/08/2015, Until Discontinued, Print    ondansetron (ZOFRAN-ODT) 8 MG disintegrating tablet Take 1 tablet by mouth every 8 (eight) hours as needed for nausea or vomiting. Reported on 08/02/2015, Starting 11/09/2014, Until Discontinued, Historical Med      STOP taking these medications     acetaminophen-codeine (TYLENOL #3) 300-30 MG tablet      acetaminophen-codeine (TYLENOL #3) 300-30 MG tablet      ALPRAZolam (XANAX) 0.5 MG tablet      clopidogrel (PLAVIX) 75 MG tablet      naproxen (NAPROSYN) 500 MG tablet      potassium chloride (K-DUR,KLOR-CON) 10 MEQ tablet      zolpidem (AMBIEN) 10 MG tablet          DISCHARGE INSTRUCTIONS:    If you experience worsening of your admission symptoms, develop shortness of breath, life threatening emergency, suicidal or homicidal thoughts you must seek medical attention immediately by calling 911 or calling your MD immediately  if symptoms less severe.  You Must read complete instructions/literature along with all the possible adverse reactions/side effects for all the Medicines you take and that have been prescribed to you. Take any new Medicines after you have completely understood and accept all the possible adverse reactions/side effects.   Please note  You were cared for by a hospitalist during your hospital stay. If you have any questions about your discharge medications or the care you received while you were in the hospital after you are discharged, you can call the unit and asked to speak  with the hospitalist on call if the hospitalist that took care of you is not available. Once you are discharged, your primary care physician will handle any further medical issues. Please note that NO REFILLS for any discharge medications will be authorized once you are discharged, as it is imperative that you return to your primary care physician (or establish a relationship with a primary care physician if you do not have one) for your aftercare needs so that they can reassess your need for medications and monitor your lab values.    Today   SUBJECTIVE   Mild chest pain.   VITAL SIGNS:  Blood pressure 92/65, pulse 66, temperature 97.5 F (36.4 C), temperature source Oral, resp. rate 17, height 5\' 4"  (1.626 m), weight 81.012 kg (178 lb 9.6 oz), last menstrual period 07/26/2015, SpO2 96 %.  I/O:   Intake/Output Summary (Last 24 hours) at 08/03/15 1823 Last data filed at 08/03/15 1200  Gross per 24 hour  Intake      0 ml  Output    350 ml  Net   -350 ml    PHYSICAL EXAMINATION:  GENERAL:  50 y.o.-year-old patient lying in the bed with no acute distress.  EYES:  Pupils equal, round, reactive to light and accommodation. No scleral icterus. Extraocular muscles intact.  HEENT: Head atraumatic, normocephalic. Oropharynx and nasopharynx clear.  NECK:  Supple, no jugular venous distention. No thyroid enlargement, no tenderness.  LUNGS: Normal breath sounds bilaterally, no wheezing, rales,rhonchi or crepitation. No use of accessory muscles of respiration.  CARDIOVASCULAR: S1, S2 normal. No murmurs, rubs, or gallops.  ABDOMEN: Soft, non-tender, non-distended. Bowel sounds present. No organomegaly or mass.  EXTREMITIES: No pedal edema, cyanosis, or clubbing.  NEUROLOGIC: Cranial nerves II through XII are intact. Muscle strength 5/5 in all extremities. Sensation intact. Gait not checked.  PSYCHIATRIC: The patient is alert and oriented x 3.  SKIN: No obvious rash, lesion, or ulcer.   DATA  REVIEW:   CBC  Recent Labs Lab 08/02/15 0606  WBC 6.7  HGB 12.8  HCT 38.0  PLT 211    Chemistries   Recent Labs Lab 08/01/15 1953  08/03/15 0424  NA 140  < > 138  K 3.3*  < > 4.2  CL 106  < > 109  CO2 25  < > 25  GLUCOSE 93  < > 98  BUN 6  < > 13  CREATININE 0.80  < > 0.84  CALCIUM 9.0  < > 8.9  MG  --   < > 1.9  AST 81*  --   --   ALT 87*  --   --   ALKPHOS 81  --   --   BILITOT 0.2*  --   --   < > = values in this interval not displayed.  Cardiac Enzymes  Recent Labs Lab 08/02/15 2056  TROPONINI 0.03    Microbiology Results  Results for orders placed or performed during the hospital encounter of 08/01/15  Urine culture     Status: None (Preliminary result)   Collection Time: 08/02/15 12:02 AM  Result Value Ref Range Status   Specimen Description URINE, CLEAN CATCH  Final   Special Requests NONE  Final   Culture   Final    >=100,000 COLONIES/mL GRAM NEGATIVE RODS IDENTIFICATION AND SUSCEPTIBILITIES TO FOLLOW    Report Status PENDING  Incomplete    RADIOLOGY:  Dg Chest 2 View  08/01/2015  CLINICAL DATA:  Initial evaluation for acute chest pain. EXAM: CHEST  2 VIEW COMPARISON:  Prior study from 06/07/2015. FINDINGS: The cardiac and mediastinal silhouettes are stable in size and contour, and remain within normal limits. The lungs are normally inflated. Minimal diffuse peribronchial thickening, similar to previous. No airspace consolidation, pleural effusion, or pulmonary edema is identified. There is no pneumothorax. No acute osseous abnormality identified. IMPRESSION: 1. No active cardiopulmonary disease. 2. Mild diffuse bronchitic changes, similar to prior, and likely chronic. Electronically Signed   By: Jeannine Boga M.D.   On: 08/01/2015 20:28        Management plans discussed with the patient, family and they are in agreement.  CODE STATUS:  Code Status History    Date Active Date Inactive Code Status Order ID Comments User Context    08/02/2015  4:30 AM 08/03/2015  5:07 PM Full Code [id number]  Saundra Shelling, MD Inpatient      TOTAL TIME TAKING CARE OF THIS PATIENT:37 minutes.    Demetrios Loll M.D on 08/03/2015 at 6:23 PM  Between 7am to 6pm - Pager - [phone]  After 6pm go to [email or web address] - password [email or web address] [facility]  [facility]  Office  [phone]  CC: Primary care physician; No PCP Per Patient

## 2015-08-04 ENCOUNTER — Emergency Department: Payer: Medicaid Other

## 2015-08-04 ENCOUNTER — Encounter: Payer: Self-pay | Admitting: Emergency Medicine

## 2015-08-04 ENCOUNTER — Emergency Department
Admission: EM | Admit: 2015-08-04 | Discharge: 2015-08-04 | Disposition: A | Discharge status: Home / Self Care | Payer: Medicaid Other | Attending: Emergency Medicine | Admitting: Emergency Medicine

## 2015-08-04 DIAGNOSIS — G8929 Other chronic pain: Secondary | ICD-10-CM | POA: Insufficient documentation

## 2015-08-04 DIAGNOSIS — F17210 Nicotine dependence, cigarettes, uncomplicated: Secondary | ICD-10-CM | POA: Diagnosis not present

## 2015-08-04 DIAGNOSIS — I252 Old myocardial infarction: Secondary | ICD-10-CM | POA: Insufficient documentation

## 2015-08-04 DIAGNOSIS — M549 Dorsalgia, unspecified: Secondary | ICD-10-CM | POA: Diagnosis not present

## 2015-08-04 DIAGNOSIS — I10 Essential (primary) hypertension: Secondary | ICD-10-CM | POA: Diagnosis not present

## 2015-08-04 DIAGNOSIS — R079 Chest pain, unspecified: Secondary | ICD-10-CM | POA: Diagnosis present

## 2015-08-04 LAB — CBC
HCT: 46.9 % (ref 35.0–47.0)
Hemoglobin: 15.4 g/dL (ref 12.0–16.0)
MCH: 28.2 pg (ref 26.0–34.0)
MCHC: 32.8 g/dL (ref 32.0–36.0)
MCV: 86 fL (ref 80.0–100.0)
Platelets: 209 10*3/uL (ref 150–440)
RBC: 5.45 MIL/uL — ABNORMAL HIGH (ref 3.80–5.20)
RDW: 17.7 % — ABNORMAL HIGH (ref 11.5–14.5)
WBC: 7.3 10*3/uL (ref 3.6–11.0)

## 2015-08-04 LAB — COMPREHENSIVE METABOLIC PANEL
ALT: 139 U/L — ABNORMAL HIGH (ref 14–54)
AST: 141 U/L — ABNORMAL HIGH (ref 15–41)
Albumin: 4 g/dL (ref 3.5–5.0)
Alkaline Phosphatase: 76 U/L (ref 38–126)
Anion gap: 6 (ref 5–15)
BUN: 10 mg/dL (ref 6–20)
CO2: 23 mmol/L (ref 22–32)
Calcium: 9 mg/dL (ref 8.9–10.3)
Chloride: 105 mmol/L (ref 101–111)
Creatinine, Ser: 0.83 mg/dL (ref 0.44–1.00)
GFR calc Af Amer: >60 mL/min (ref >60)
GFR calc non Af Amer: >60 mL/min (ref >60)
Glucose, Bld: 87 mg/dL (ref 65–99)
Potassium: 5.1 mmol/L (ref 3.5–5.1)
Sodium: 134 mmol/L — ABNORMAL LOW (ref 135–145)
Total Bilirubin: 1.1 mg/dL (ref 0.3–1.2)
Total Protein: 7.3 g/dL (ref 6.5–8.1)

## 2015-08-04 LAB — URINE CULTURE: Culture: >=100000

## 2015-08-04 LAB — TROPONIN I: Troponin I: <0.03 ng/mL (ref <0.031)

## 2015-08-04 MED ORDER — OXYCODONE-ACETAMINOPHEN 5-325 MG PO TABS
2.0000 | ORAL_TABLET | Freq: Once | ORAL | Status: AC
  Administered 2015-08-04: 2 via ORAL

## 2015-08-04 MED ORDER — BENZONATATE 200 MG PO CAPS: 200.0000 mg | ORAL_CAPSULE | Freq: Three times a day (TID) | ORAL | Status: DC | PRN

## 2015-08-04 MED ORDER — IOHEXOL 350 MG/ML SOLN
100.0000 mL | Freq: Once | INTRAVENOUS | Status: AC | PRN
  Administered 2015-08-04: 100 mL via INTRAVENOUS
  Filled 2015-08-04: qty 100

## 2015-08-04 MED ORDER — OXYCODONE-ACETAMINOPHEN 5-325 MG PO TABS
ORAL_TABLET | ORAL | Status: AC
  Administered 2015-08-04: 2 via ORAL
  Filled 2015-08-04: qty 2

## 2015-08-04 NOTE — ED Notes (Signed)
Pt states she was seen here on the 7th for chest pain and admitted. Pt states she was discharged yesterday and suppose to follow up with Cardiologist in 2 weeks. Pt states she is having continued chest pain that is the same as it was two days ago. Pt states pain is in center of chest and radiates to neck and left arm. Pt complains of tingling to left arm.

## 2015-08-04 NOTE — Discharge Instructions (Signed)
Nonspecific Chest Pain  °Chest pain can be caused by many different conditions. There is always a chance that your pain could be related to something serious, such as a heart attack or a blood clot in your lungs. Chest pain can also be caused by conditions that are not life-threatening. If you have chest pain, it is very important to follow up with your health care provider. °CAUSES  °Chest pain can be caused by: °· Heartburn. °· Pneumonia or bronchitis. °· Anxiety or stress. °· Inflammation around your heart (pericarditis) or lung (pleuritis or pleurisy). °· A blood clot in your lung. °· A collapsed lung (pneumothorax). It can develop suddenly on its own (spontaneous pneumothorax) or from trauma to the chest. °· Shingles infection (varicella-zoster virus). °· Heart attack. °· Damage to the bones, muscles, and cartilage that make up your chest wall. This can include: °¨ Bruised bones due to injury. °¨ Strained muscles or cartilage due to frequent or repeated coughing or overwork. °¨ Fracture to one or more ribs. °¨ Sore cartilage due to inflammation (costochondritis). °RISK FACTORS  °Risk factors for chest pain may include: °· Activities that increase your risk for trauma or injury to your chest. °· Respiratory infections or conditions that cause frequent coughing. °· Medical conditions or overeating that can cause heartburn. °· Heart disease or family history of heart disease. °· Conditions or health behaviors that increase your risk of developing a blood clot. °· Having had chicken pox (varicella zoster). °SIGNS AND SYMPTOMS °Chest pain can feel like: °· Burning or tingling on the surface of your chest or deep in your chest. °· Crushing, pressure, aching, or squeezing pain. °· Dull or sharp pain that is worse when you move, cough, or take a deep breath. °· Pain that is also felt in your back, neck, shoulder, or arm, or pain that spreads to any of these areas. °Your chest pain may come and go, or it may stay  constant. °DIAGNOSIS °Lab tests or other studies may be needed to find the cause of your pain. Your health care provider may have you take a test called an ambulatory ECG (electrocardiogram). An ECG records your heartbeat patterns at the time the test is performed. You may also have other tests, such as: °· Transthoracic echocardiogram (TTE). During echocardiography, sound waves are used to create a picture of all of the heart structures and to look at how blood flows through your heart. °· Transesophageal echocardiogram (TEE). This is a more advanced imaging test that obtains images from inside your body. It allows your health care provider to see your heart in finer detail. °· Cardiac monitoring. This allows your health care provider to monitor your heart rate and rhythm in real time. °· Holter monitor. This is a portable device that records your heartbeat and can help to diagnose abnormal heartbeats. It allows your health care provider to track your heart activity for several days, if needed. °· Stress tests. These can be done through exercise or by taking medicine that makes your heart beat more quickly. °· Blood tests. °· Imaging tests. °TREATMENT  °Your treatment depends on what is causing your chest pain. Treatment may include: °· Medicines. These may include: °¨ Acid blockers for heartburn. °¨ Anti-inflammatory medicine. °¨ Pain medicine for inflammatory conditions. °¨ Antibiotic medicine, if an infection is present. °¨ Medicines to dissolve blood clots. °¨ Medicines to treat coronary artery disease. °· Supportive care for conditions that do not require medicines. This may include: °¨ Resting. °¨ Applying heat   or cold packs to injured areas. °¨ Limiting activities until pain decreases. °HOME CARE INSTRUCTIONS °· If you were prescribed an antibiotic medicine, finish it all even if you start to feel better. °· Avoid any activities that bring on chest pain. °· Do not use any tobacco products, including  cigarettes, chewing tobacco, or electronic cigarettes. If you need help quitting, ask your health care provider. °· Do not drink alcohol. °· Take medicines only as directed by your health care provider. °· Keep all follow-up visits as directed by your health care provider. This is important. This includes any further testing if your chest pain does not go away. °· If heartburn is the cause for your chest pain, you may be told to keep your head raised (elevated) while sleeping. This reduces the chance that acid will go from your stomach into your esophagus. °· Make lifestyle changes as directed by your health care provider. These may include: °¨ Getting regular exercise. Ask your health care provider to suggest some activities that are safe for you. °¨ Eating a heart-healthy diet. A registered dietitian can help you to learn healthy eating options. °¨ Maintaining a healthy weight. °¨ Managing diabetes, if necessary. °¨ Reducing stress. °SEEK MEDICAL CARE IF: °· Your chest pain does not go away after treatment. °· You have a rash with blisters on your chest. °· You have a fever. °SEEK IMMEDIATE MEDICAL CARE IF:  °· Your chest pain is worse. °· You have an increasing cough, or you cough up blood. °· You have severe abdominal pain. °· You have severe weakness. °· You faint. °· You have chills. °· You have sudden, unexplained chest discomfort. °· You have sudden, unexplained discomfort in your arms, back, neck, or jaw. °· You have shortness of breath at any time. °· You suddenly start to sweat, or your skin gets clammy. °· You feel nauseous or you vomit. °· You suddenly feel light-headed or dizzy. °· Your heart begins to beat quickly, or it feels like it is skipping beats. °These symptoms may represent a serious problem that is an emergency. Do not wait to see if the symptoms will go away. Get medical help right away. Call your local emergency services (911 in the [place] Do not drive yourself to the hospital. °  °This  information is not intended to replace advice given to you by your health care provider. Make sure you discuss any questions you have with your health care provider. °  °Document Released: 03/20/2005 Document Revised: 07/01/2014 Document Reviewed: 01/14/2014 °Elsevier Interactive Patient Education ©2016 [employer] ° °

## 2015-08-04 NOTE — ED Provider Notes (Signed)
[facility] Emergency Department Provider Note     Time seen: ----------------------------------------- 4:29 PM on 08/04/2015 -----------------------------------------    I have reviewed the triage vital signs and the nursing notes.   HISTORY  Chief Complaint Chest Pain    HPI Sara Munoz is a 50 y.o. female presents ER for chest pain and raised in the left arm. Patient states she's recently admitted the hospital for same, it also radiates into her back and neck. She's had tingling to the left arm. Patient was supposed to follow up with cardiologist in 2 weeks but she states the pains actually worse than was that she was in the hospital. Nothing makes her symptoms better or worse.   Past Medical History  Diagnosis Date  . Hypertension   . Coronary artery disease     a. Mild-non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/2014  . COPD (chronic obstructive pulmonary disease) ([place])   . Hypercholesteremia   . Chronic back pain   . MI (myocardial infarction) [facility])     Patient Active Problem List   Diagnosis Date Noted  . Atypical chest pain 08/02/2015  . Non-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/2014 08/02/2015  . Essential HTN 08/02/2015  . Hyperlipidemia 08/02/2015  . Chronic back pain greater than 3 months duration 08/02/2015  . Chest pain with low risk for cardiac etiology 08/02/2015  . Costochondritis 08/02/2015    Past Surgical History  Procedure Laterality Date  . Orif femur fracture    . Orif tibia & fibula fractures    . Hemorroids    . Ovary surgery      Allergies Flexeril; Keflex; Levaquin; Toradol; Tramadol; and Zoloft  Social History Social History  Substance Use Topics  . Smoking status: Current Every Day Smoker -- 1.00 packs/day    Types: Cigarettes  . Smokeless tobacco: Never Used  . Alcohol Use: 0.0 oz/week    0 Standard drinks or equivalent per week     Comment: drinks occasionally    Review of Systems Constitutional: Negative for  fever. Eyes: Negative for visual changes. ENT: Negative for sore throat. Cardiovascular: Positive for chest pain Respiratory: Negative for shortness of breath. Gastrointestinal: Negative for abdominal pain, vomiting and diarrhea. Genitourinary: Negative for dysuria. Musculoskeletal: Positive for back pain Skin: Negative for rash. Neurological: Negative for headaches, focal weakness or numbness.  10-point ROS otherwise negative.  ____________________________________________   PHYSICAL EXAM:  VITAL SIGNS: ED Triage Vitals  Enc Vitals Group     BP 08/04/15 1404 128/95 mmHg     Pulse Rate 08/04/15 1404 78     Resp 08/04/15 1404 18     Temp 08/04/15 1404 97.8 F (36.6 C)     Temp Source 08/04/15 1404 Oral     SpO2 08/04/15 1404 99 %     Weight 08/04/15 1404 178 lb (80.74 kg)     Height 08/04/15 1404 5\' 4"  (1.626 m)     Head Cir --      Peak Flow --      Pain Score 08/04/15 1405 10     Pain Loc --      Pain Edu? --      Excl. in [place]? --     Constitutional: Alert and oriented. Well appearing and in no distress. Eyes: Conjunctivae are normal. PERRL. Normal extraocular movements. ENT   Head: Normocephalic and atraumatic.   Nose: No congestion/rhinnorhea.   Mouth/Throat: Mucous membranes are moist.   Neck: No stridor. Cardiovascular: Normal rate, regular rhythm. Normal  and symmetric distal pulses are present in all extremities. No murmurs, rubs, or gallops. Respiratory: Normal respiratory effort without tachypnea nor retractions. Breath sounds are clear and equal bilaterally. No wheezes/rales/rhonchi. Gastrointestinal: Soft and nontender. No distention. No abdominal bruits.  Musculoskeletal: Nontender with normal range of motion in all extremities. No joint effusions.  No lower extremity tenderness nor edema. Neurologic:  Normal speech and language. No gross focal neurologic deficits are appreciated. Speech is normal. No gait instability. Skin:  Skin is warm, dry  and intact. No rash noted. Psychiatric: Mood and affect are normal. Speech and behavior are normal. Patient exhibits appropriate insight and judgment. ____________________________________________  EKG: Interpreted by me. Normal sinus rhythm with a rate of 84 bpm, normal PR interval, normal QRS, normal QT interval. Normal axis.  ____________________________________________  ED COURSE:  Pertinent labs & imaging results that were available during my care of the patient were reviewed by me and considered in my medical decision making (see chart for details). Patient is in no acute distress, will check cardiac labs and possible CT imaging. ____________________________________________    LABS (pertinent positives/negatives)  Labs Reviewed  CBC - Abnormal; Notable for the following:    RBC 5.45 (*)    RDW 17.7 (*)    All other components within normal limits  COMPREHENSIVE METABOLIC PANEL - Abnormal; Notable for the following:    Sodium 134 (*)    AST 141 (*)    ALT 139 (*)    All other components within normal limits  TROPONIN I    RADIOLOGY Images were viewed by me  CT angiogram IMPRESSION: Unusual for age calcified atherosclerotic disease of the coronary arteries. Cardiologic consult is recommended.  Subtle sub solid subpleural opacities throughout the right lung with nonspecific appearance. These may represent mild infectious or inflammatory changes. Follow-up in 3 months is recommended to assure resolution.  No evidence of pulmonary embolus or thoracic dissection. ____________________________________________  FINAL ASSESSMENT AND PLAN  Chest pain, chronic pain  Plan: Patient with labs and imaging as dictated above. No specific etiology for her symptoms are found. Patient was just admitted and ruled out for acute cornea syndrome. Her LFTs are elevated and she was advised of this. She is advised to not take any Tylenol, she is stable for outpatient  follow-up.   Earleen Newport, MD   Earleen Newport, MD 08/04/15 [phone]

## 2015-08-22 ENCOUNTER — Ambulatory Visit: Payer: Medicaid Other | Admitting: Physician Assistant

## 2015-10-09 ENCOUNTER — Encounter: Payer: Self-pay | Admitting: Emergency Medicine

## 2015-10-09 ENCOUNTER — Observation Stay
Admission: EM | Admit: 2015-10-09 | Discharge: 2015-10-10 | Disposition: A | Discharge status: Home / Self Care | Payer: Medicaid Other | Attending: Internal Medicine | Admitting: Internal Medicine

## 2015-10-09 ENCOUNTER — Emergency Department: Payer: Medicaid Other

## 2015-10-09 DIAGNOSIS — I160 Hypertensive urgency: Secondary | ICD-10-CM | POA: Diagnosis present

## 2015-10-09 DIAGNOSIS — R918 Other nonspecific abnormal finding of lung field: Secondary | ICD-10-CM | POA: Diagnosis not present

## 2015-10-09 DIAGNOSIS — G8929 Other chronic pain: Secondary | ICD-10-CM | POA: Diagnosis not present

## 2015-10-09 DIAGNOSIS — R079 Chest pain, unspecified: Principal | ICD-10-CM | POA: Insufficient documentation

## 2015-10-09 DIAGNOSIS — I10 Essential (primary) hypertension: Secondary | ICD-10-CM | POA: Insufficient documentation

## 2015-10-09 DIAGNOSIS — Z79899 Other long term (current) drug therapy: Secondary | ICD-10-CM | POA: Diagnosis not present

## 2015-10-09 DIAGNOSIS — E669 Obesity, unspecified: Secondary | ICD-10-CM | POA: Diagnosis not present

## 2015-10-09 DIAGNOSIS — F17210 Nicotine dependence, cigarettes, uncomplicated: Secondary | ICD-10-CM | POA: Diagnosis not present

## 2015-10-09 DIAGNOSIS — Z6831 Body mass index (BMI) 31.0-31.9, adult: Secondary | ICD-10-CM | POA: Insufficient documentation

## 2015-10-09 DIAGNOSIS — Z7951 Long term (current) use of inhaled steroids: Secondary | ICD-10-CM | POA: Insufficient documentation

## 2015-10-09 DIAGNOSIS — Z801 Family history of malignant neoplasm of trachea, bronchus and lung: Secondary | ICD-10-CM | POA: Insufficient documentation

## 2015-10-09 DIAGNOSIS — K219 Gastro-esophageal reflux disease without esophagitis: Secondary | ICD-10-CM | POA: Diagnosis not present

## 2015-10-09 DIAGNOSIS — F1120 Opioid dependence, uncomplicated: Secondary | ICD-10-CM | POA: Insufficient documentation

## 2015-10-09 DIAGNOSIS — Z881 Allergy status to other antibiotic agents status: Secondary | ICD-10-CM | POA: Diagnosis not present

## 2015-10-09 DIAGNOSIS — E7800 Pure hypercholesterolemia, unspecified: Secondary | ICD-10-CM | POA: Diagnosis not present

## 2015-10-09 DIAGNOSIS — Z8249 Family history of ischemic heart disease and other diseases of the circulatory system: Secondary | ICD-10-CM | POA: Diagnosis not present

## 2015-10-09 DIAGNOSIS — Z9889 Other specified postprocedural states: Secondary | ICD-10-CM | POA: Insufficient documentation

## 2015-10-09 DIAGNOSIS — Z888 Allergy status to other drugs, medicaments and biological substances status: Secondary | ICD-10-CM | POA: Diagnosis not present

## 2015-10-09 DIAGNOSIS — I252 Old myocardial infarction: Secondary | ICD-10-CM | POA: Diagnosis not present

## 2015-10-09 DIAGNOSIS — Z8041 Family history of malignant neoplasm of ovary: Secondary | ICD-10-CM | POA: Diagnosis not present

## 2015-10-09 DIAGNOSIS — I700 Atherosclerosis of aorta: Secondary | ICD-10-CM | POA: Insufficient documentation

## 2015-10-09 DIAGNOSIS — I2510 Atherosclerotic heart disease of native coronary artery without angina pectoris: Secondary | ICD-10-CM | POA: Diagnosis not present

## 2015-10-09 DIAGNOSIS — E876 Hypokalemia: Secondary | ICD-10-CM | POA: Insufficient documentation

## 2015-10-09 DIAGNOSIS — J440 Chronic obstructive pulmonary disease with acute lower respiratory infection: Secondary | ICD-10-CM | POA: Insufficient documentation

## 2015-10-09 DIAGNOSIS — Z7982 Long term (current) use of aspirin: Secondary | ICD-10-CM | POA: Diagnosis not present

## 2015-10-09 DIAGNOSIS — K760 Fatty (change of) liver, not elsewhere classified: Secondary | ICD-10-CM | POA: Diagnosis not present

## 2015-10-09 DIAGNOSIS — E785 Hyperlipidemia, unspecified: Secondary | ICD-10-CM | POA: Diagnosis not present

## 2015-10-09 DIAGNOSIS — J209 Acute bronchitis, unspecified: Secondary | ICD-10-CM | POA: Insufficient documentation

## 2015-10-09 DIAGNOSIS — M549 Dorsalgia, unspecified: Secondary | ICD-10-CM | POA: Diagnosis not present

## 2015-10-09 DIAGNOSIS — J449 Chronic obstructive pulmonary disease, unspecified: Secondary | ICD-10-CM | POA: Diagnosis present

## 2015-10-09 LAB — TROPONIN I: Troponin I: <0.03 ng/mL (ref <0.031)

## 2015-10-09 LAB — BASIC METABOLIC PANEL
Anion gap: 6 (ref 5–15)
BUN: 9 mg/dL (ref 6–20)
CO2: 23 mmol/L (ref 22–32)
Calcium: 8.3 mg/dL — ABNORMAL LOW (ref 8.9–10.3)
Chloride: 106 mmol/L (ref 101–111)
Creatinine, Ser: 0.69 mg/dL (ref 0.44–1.00)
GFR calc Af Amer: >60 mL/min (ref >60)
GFR calc non Af Amer: >60 mL/min (ref >60)
Glucose, Bld: 94 mg/dL (ref 65–99)
Potassium: 3 mmol/L — ABNORMAL LOW (ref 3.5–5.1)
Sodium: 135 mmol/L (ref 135–145)

## 2015-10-09 LAB — CBC
HCT: 40 % (ref 35.0–47.0)
Hemoglobin: 13.4 g/dL (ref 12.0–16.0)
MCH: 27.7 pg (ref 26.0–34.0)
MCHC: 33.5 g/dL (ref 32.0–36.0)
MCV: 82.7 fL (ref 80.0–100.0)
Platelets: 267 10*3/uL (ref 150–440)
RBC: 4.83 MIL/uL (ref 3.80–5.20)
RDW: 16.2 % — ABNORMAL HIGH (ref 11.5–14.5)
WBC: 8.6 10*3/uL (ref 3.6–11.0)

## 2015-10-09 MED ORDER — LABETALOL HCL 5 MG/ML IV SOLN
20.0000 mg | Freq: Once | INTRAVENOUS | Status: AC
  Administered 2015-10-09: 20 mg via INTRAVENOUS
  Filled 2015-10-09: qty 4

## 2015-10-09 MED ORDER — ONDANSETRON HCL 4 MG/2ML IJ SOLN
INTRAMUSCULAR | Status: AC
  Administered 2015-10-09: 4 mg via INTRAVENOUS
  Filled 2015-10-09: qty 2

## 2015-10-09 MED ORDER — IOPAMIDOL (ISOVUE-370) INJECTION 76%
100.0000 mL | Freq: Once | INTRAVENOUS | Status: AC | PRN
  Administered 2015-10-09: 100 mL via INTRAVENOUS

## 2015-10-09 MED ORDER — MORPHINE SULFATE (PF) 4 MG/ML IV SOLN
4.0000 mg | Freq: Once | INTRAVENOUS | Status: AC
  Administered 2015-10-09: 4 mg via INTRAVENOUS
  Filled 2015-10-09: qty 1

## 2015-10-09 MED ORDER — ONDANSETRON HCL 4 MG/2ML IJ SOLN
4.0000 mg | Freq: Once | INTRAMUSCULAR | Status: AC
  Administered 2015-10-09: 4 mg via INTRAVENOUS
  Filled 2015-10-09: qty 2

## 2015-10-09 MED ORDER — LISINOPRIL 10 MG PO TABS
20.0000 mg | ORAL_TABLET | Freq: Once | ORAL | Status: AC
  Administered 2015-10-09: 20 mg via ORAL
  Filled 2015-10-09: qty 2

## 2015-10-09 MED ORDER — POTASSIUM CHLORIDE CRYS ER 20 MEQ PO TBCR
40.0000 meq | EXTENDED_RELEASE_TABLET | Freq: Once | ORAL | Status: AC
  Administered 2015-10-09: 40 meq via ORAL
  Filled 2015-10-09: qty 2

## 2015-10-09 MED ORDER — MORPHINE SULFATE (PF) 4 MG/ML IV SOLN
4.0000 mg | Freq: Once | INTRAVENOUS | Status: AC
Start: 2015-10-09 — End: 2015-10-09
  Administered 2015-10-09: 4 mg via INTRAVENOUS
  Filled 2015-10-09: qty 1

## 2015-10-09 MED ORDER — ATENOLOL 25 MG PO TABS
25.0000 mg | ORAL_TABLET | Freq: Once | ORAL | Status: AC
  Administered 2015-10-09: 25 mg via ORAL
  Filled 2015-10-09: qty 1

## 2015-10-09 MED ORDER — NITROGLYCERIN 2 % TD OINT
1.0000 [in_us] | TOPICAL_OINTMENT | Freq: Once | TRANSDERMAL | Status: AC
  Administered 2015-10-09: 1 [in_us] via TOPICAL
  Filled 2015-10-09: qty 1

## 2015-10-09 MED ORDER — ACETAMINOPHEN 325 MG PO TABS
ORAL_TABLET | ORAL | Status: AC
  Filled 2015-10-09: qty 2

## 2015-10-09 MED ORDER — ACETAMINOPHEN 325 MG PO TABS
650.0000 mg | ORAL_TABLET | Freq: Once | ORAL | Status: AC
  Administered 2015-10-09: 650 mg via ORAL

## 2015-10-09 MED ORDER — ONDANSETRON HCL 4 MG/2ML IJ SOLN
4.0000 mg | Freq: Once | INTRAMUSCULAR | Status: AC
  Administered 2015-10-09: 4 mg via INTRAVENOUS

## 2015-10-09 NOTE — ED Notes (Signed)
C/o chest pain x 2 days.  Patient indicates pain is in epigastric region.  Patient has been out of home medication sfor 1 month.  4 baby ASA taken PTA and NTG and zofran given by EMS.

## 2015-10-09 NOTE — ED Provider Notes (Addendum)
[facility] Emergency Department Provider Note   ____________________________________________  Time seen: Approximately 7:15 PM I have reviewed the triage vital signs and the triage nursing note.  HISTORY  Chief Complaint Chest Pain   Historian Patient  HPI Baylin Rascon [place] is a 50 y.o. female who has been out of her medications for about 3 weeks now, including atenolol and lisinopril for hypertension, and has also had a history of a MI in the past, although not in [place]. She does not have primary care physician or cardiology here in [place] yet.  She started having chest pain 2 days ago, not exertional, constant but waxing and waning. At worse 10 out of 10. Currently 8 of 10. Some nausea, some chronic shortness of breath and some bronchitis type symptoms.  Symptoms are moderate.    Past Medical History  Diagnosis Date  . Hypertension   . Coronary artery disease     a. Mild-non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/2014  . COPD (chronic obstructive pulmonary disease) ([place])   . Hypercholesteremia   . Chronic back pain   . MI (myocardial infarction) [facility])     Patient Active Problem List   Diagnosis Date Noted  . Atypical chest pain 08/02/2015  . Non-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/2014 08/02/2015  . Essential HTN 08/02/2015  . Hyperlipidemia 08/02/2015  . Chronic back pain greater than 3 months duration 08/02/2015  . Chest pain with low risk for cardiac etiology 08/02/2015  . Costochondritis 08/02/2015    Past Surgical History  Procedure Laterality Date  . Orif femur fracture    . Orif tibia & fibula fractures    . Hemorroids    . Ovary surgery      Current Outpatient Rx  Name  Route  Sig  Dispense  Refill  . albuterol (PROVENTIL HFA;VENTOLIN HFA) 108 (90 Base) MCG/ACT inhaler   Inhalation   Inhale 2 puffs into the lungs every 4 (four) hours as needed for wheezing or shortness of breath.   1 Inhaler   0   . aspirin EC 81 MG EC tablet  Oral   Take 1 tablet (81 mg total) by mouth daily.   30 tablet   2   . atorvastatin (LIPITOR) 20 MG tablet   Oral   Take 1 tablet (20 mg total) by mouth daily.   30 tablet   2   . benzonatate (TESSALON) 200 MG capsule   Oral   Take 1 capsule (200 mg total) by mouth 3 (three) times daily as needed for cough.   20 capsule   0   . budesonide-formoterol (SYMBICORT) 160-4.5 MCG/ACT inhaler   Inhalation   Inhale 2 puffs into the lungs 2 (two) times daily. Patient not taking: Reported on 08/02/2015   1 Inhaler   0   . clonazePAM (KLONOPIN) 1 MG tablet   Oral   Take 1 tablet by mouth 2 (two) times daily. Reported on 08/02/2015      0   . DEXILANT 60 MG capsule   Oral   Take 1 capsule by mouth daily. Reported on 08/02/2015      0     Dispense as written.   . diazepam (VALIUM) 2 MG tablet   Oral   Take 1 tablet (2 mg total) by mouth every 8 (eight) hours as needed for anxiety.   15 tablet   0   . gabapentin (NEURONTIN) 300 MG capsule   Oral   Take 2 capsules (600 mg total)  by mouth 3 (three) times daily.   180 capsule   0   . lisinopril (PRINIVIL,ZESTRIL) 20 MG tablet   Oral   Take 1 tablet (20 mg total) by mouth daily. Reported on 08/02/2015   30 tablet   2   . metoprolol tartrate (LOPRESSOR) 25 MG tablet   Oral   Take 1 tablet (25 mg total) by mouth 2 (two) times daily.   60 tablet   2   . nicotine (NICODERM CQ - DOSED IN MG/24 HOURS) 14 mg/24hr patch   Transdermal   Place 1 patch (14 mg total) onto the skin daily. Patient not taking: Reported on 08/02/2015   30 patch   0   . nitroGLYCERIN (NITROSTAT) 0.4 MG SL tablet   Sublingual   Place 1 tablet (0.4 mg total) under the tongue every 5 (five) minutes x 3 doses as needed for chest pain.   30 tablet   2   . ondansetron (ZOFRAN-ODT) 8 MG disintegrating tablet   Oral   Take 1 tablet by mouth every 8 (eight) hours as needed for nausea or vomiting. Reported on 08/02/2015      0   . RANEXA 500 MG 12 hr tablet    Oral   Take 1 tablet (500 mg total) by mouth 2 (two) times daily.   60 tablet   2     Dispense as written.     Allergies Flexeril; Keflex; Levaquin; Toradol; Tramadol; and Zoloft  Family History  Problem Relation Age of Onset  . Heart disease Mother   . Lung cancer Mother   . Ovarian cancer Mother     Social History Social History  Substance Use Topics  . Smoking status: Current Every Day Smoker -- 1.00 packs/day    Types: Cigarettes  . Smokeless tobacco: Never Used  . Alcohol Use: 0.0 oz/week    0 Standard drinks or equivalent per week     Comment: drinks occasionally    Review of Systems  Constitutional: Negative for fever. Eyes: Negative for visual changes. ENT: Negative for sore throat. Cardiovascular: Positive for chest pain. No pleuritic chest pain. Respiratory: Some mild acute on chronic shortness of breath. Gastrointestinal: Negative for abdominal pain, vomiting and diarrhea. Genitourinary: Negative for dysuria. Musculoskeletal: Negative for back pain. Skin: Negative for rash. Neurological: Negative for headache. 10 point Review of Systems otherwise negative ____________________________________________   PHYSICAL EXAM:  VITAL SIGNS: ED Triage Vitals  Enc Vitals Group     BP 10/09/15 1709 156/112 mmHg     Pulse Rate 10/09/15 1709 98     Resp 10/09/15 1709 17     Temp 10/09/15 1709 98.3 F (36.8 C)     Temp Source 10/09/15 1709 Oral     SpO2 10/09/15 1709 98 %     Weight 10/09/15 1709 180 lb (81.647 kg)     Height 10/09/15 1709 5\' 4"  (1.626 m)     Head Cir --      Peak Flow --      Pain Score 10/09/15 1709 9     Pain Loc --      Pain Edu? --      Excl. in [place]? --      Constitutional: Alert and oriented. Well appearing and in no distress. HEENT   Head: Normocephalic and atraumatic.      Eyes: Conjunctivae are normal. PERRL. Normal extraocular movements.      Ears:         Nose: No congestion/rhinnorhea.  Mouth/Throat: Mucous  membranes are moist.   Neck: No stridor. Cardiovascular/Chest: Normal rate, regular rhythm.  No murmurs, rubs, or gallops. Respiratory: Normal respiratory effort without tachypnea nor retractions. Breath sounds are clear and equal bilaterally. No wheezes/rales.  Mild rhonchi. Gastrointestinal: Soft. No distention, no guarding, no rebound. Nontender.    Genitourinary/rectal:Deferred Musculoskeletal: Nontender with normal range of motion in all extremities. No joint effusions.  No lower extremity tenderness.  No edema. Neurologic:  Normal speech and language. No gross or focal neurologic deficits are appreciated. Skin:  Skin is warm, dry and intact. No rash noted. Psychiatric: Mood and affect are normal. Speech and behavior are normal. Patient exhibits appropriate insight and judgment.  ____________________________________________   EKG I, Lisa Roca, MD, the attending physician have personally viewed and interpreted all ECGs.  96 bpm. Normal sinus rhythm. Narrow QRS. Normal axis. Q waves septally. No ST segment elevation or depression. ____________________________________________  LABS (pertinent positives/negatives)  Basic metabolic panel significant for potassium 3.0 otherwise without significant abnormality Troponin less than 0.03 White blood count 8.6, hemoglobin 13.4  ____________________________________________  RADIOLOGY All Xrays were viewed by me. Imaging interpreted by Radiologist.  Chest xray:  IMPRESSION: 1. Mild linear subsegmental atelectasis or scarring along the right hemidiaphragm. __________________________________________  PROCEDURES  Procedure(s) performed: None  Critical Care performed: CRITICAL CARE Performed by: Lisa Roca   Total critical care time: 30 minutes  Critical care time was exclusive of separately billable procedures and treating other patients.  Critical care was necessary to treat or prevent imminent or life-threatening  deterioration.  Critical care was time spent personally by me on the following activities: development of treatment plan with patient and/or surrogate as well as nursing, discussions with consultants, evaluation of patient's response to treatment, examination of patient, obtaining history from patient or surrogate, ordering and performing treatments and interventions, ordering and review of laboratory studies, ordering and review of radiographic studies, pulse oximetry and re-evaluation of patient's condition.   ____________________________________________   ED COURSE / ASSESSMENT AND PLAN  Pertinent labs & imaging results that were available during my care of the patient were reviewed by me and considered in my medical decision making (see chart for details).   This patient has run out of her medications and is having chest pain which has been ongoing now for 2 days. Her EKG is without acute ischemic findings, and her troponin is negative. She is however significantly hypertensive. It's unclear whether or not her chest pain is inducing high blood pressure, or she is having hypertensive urgency, chest pain from high blood pressure. She will be repleted for her hypokalemia. I am going to obtain a chest CT to rule out aortic emergency. I'll also treat her high blood pressure as hypertensive urgency with IV labetalol dose.  She did take 2 baby aspirin at home, and took 2 baby aspirin on a meal and spread.   Blood pressure 180/140, after 20 mg IV labetalol, 150/105. Patient states her symptoms of chest pain or a little improved, but she still having ongoing chest pain. Patient was given nitro paste. She'll be given another dose of morphine.  CT chest showed no emergency findings. There is question about possible small/early pneumonia. Patient is not having symptoms of pneumonia, including no fever, cough, or elevated white blood cell count. I would hold off on any antibiotic treatment at this point  time as I'm not concerned clinically about pneumonia.  I am to admit the patient for cardiac rule out and treatment of malignant  hypertension. She was given what she reports to be her prior home blood pressure pills, lisinopril and atenolol.   CONSULTATIONS:   Hospitalist for admission.   Patient / Family / Caregiver informed of clinical course, medical decision-making process, and agree with plan.    ___________________________________________   FINAL CLINICAL IMPRESSION(S) / ED DIAGNOSES   Final diagnoses:  Chest pain, unspecified  Hypokalemia  Essential hypertension  Hypertensive urgency              Note: This dictation was prepared with Dragon dictation. Any transcriptional errors that result from this process are unintentional   Lisa Roca, MD 10/09/15 2114  Lisa Roca, MD 10/09/15 2125

## 2015-10-09 NOTE — H&P (Signed)
[facility] [place] NAME: Sara Munoz    MR#:  [id number]  DATE OF BIRTH:  January 22, 1966  DATE OF ADMISSION:  10/09/2015  PRIMARY CARE PHYSICIAN: No PCP Per Patient   REQUESTING/REFERRING PHYSICIAN: Reita Cliche, MD  CHIEF COMPLAINT:   Chief Complaint  Patient presents with  . Chest Pain    HISTORY OF PRESENT ILLNESS:  Sara Munoz  is a 50 y.o. female who presents with persistent chest pain. Patient states that she has a long-standing history of CAD and chest pain. She recently moved to this area and has not established with a cardiologist at this point. Due to that she has not been able to carry refills for her medications. She came in today with central chest pain radiating to her left shoulder and her jaw, and describes no associated alleviating or aggravating factors. She says this pain has been going on for the last 2 days. She states that it feels similar to her prior episodes of chest pain related to her CAD. Workup today in the ED is largely negative, without any focal ischemic changes on EKG and first set of cardiac enzymes negative. Her chest pain is not very much relieved with the nitroglycerin paste given to her in the ED. Her blood pressure was significantly elevated initially, but improved with treatment of the knee. Hospitalists were called for further evaluation and admission.  PAST MEDICAL HISTORY:   Past Medical History  Diagnosis Date  . Hypertension   . Coronary artery disease     a. Mild-non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/2014  . COPD (chronic obstructive pulmonary disease) ([place])   . Hypercholesteremia   . Chronic back pain   . MI (myocardial infarction) ([place])     PAST SURGICAL HISTORY:   Past Surgical History  Procedure Laterality Date  . Orif femur fracture    . Orif tibia & fibula fractures    . Hemorroids    . Ovary surgery      SOCIAL HISTORY:   Social History  Substance Use Topics  . Smoking status:  Current Every Day Smoker -- 1.00 packs/day    Types: Cigarettes  . Smokeless tobacco: Never Used  . Alcohol Use: 0.0 oz/week    0 Standard drinks or equivalent per week     Comment: drinks occasionally    FAMILY HISTORY:   Family History  Problem Relation Age of Onset  . Heart disease Mother   . Lung cancer Mother   . Ovarian cancer Mother     DRUG ALLERGIES:   Allergies  Allergen Reactions  . Flexeril [Cyclobenzaprine] Hives, Swelling and Other (See Comments)    Reaction:  Facial/lip swelling   . Keflex [Cephalexin] Hives  . Levofloxacin Hives  . Toradol [Ketorolac Tromethamine] Swelling and Other (See Comments)    Reaction:  Facial/tongue swelling   . Tramadol Hives, Swelling and Other (See Comments)    Reaction:  Lip swelling   . Zoloft [Sertraline Hcl] Swelling and Other (See Comments)    Reaction:  Tongue swelling     MEDICATIONS AT HOME:   Prior to Admission medications   Medication Sig Start Date End Date Taking? Authorizing Provider  albuterol (PROVENTIL HFA;VENTOLIN HFA) 108 (90 Base) MCG/ACT inhaler Inhale 2 puffs into the lungs every 4 (four) hours as needed for wheezing or shortness of breath. 08/03/15  Yes Demetrios Loll, MD  ALPRAZolam Duanne Moron) 0.5 MG tablet Take 0.5 mg by mouth 3 (three) times daily as needed  for anxiety.   Yes Historical Provider, MD  aspirin EC 81 MG tablet Take 81 mg by mouth daily.   Yes Historical Provider, MD  atenolol (TENORMIN) 50 MG tablet Take 50 mg by mouth daily.   Yes Historical Provider, MD  atorvastatin (LIPITOR) 20 MG tablet Take 20 mg by mouth at bedtime.   Yes Historical Provider, MD  budesonide-formoterol (SYMBICORT) 160-4.5 MCG/ACT inhaler Inhale 2 puffs into the lungs 2 (two) times daily. 05/31/15  Yes Gregor Hams, MD  clonazePAM (KLONOPIN) 1 MG tablet Take 1 tablet by mouth 2 (two) times daily.    Yes Historical Provider, MD  clopidogrel (PLAVIX) 75 MG tablet Take 75 mg by mouth daily.   Yes Historical Provider, MD   dexlansoprazole (DEXILANT) 60 MG capsule Take 60 mg by mouth daily.   Yes Historical Provider, MD  diazepam (VALIUM) 2 MG tablet Take 1 tablet (2 mg total) by mouth every 8 (eight) hours as needed for anxiety. 08/03/15  Yes Demetrios Loll, MD  docusate sodium (COLACE) 100 MG capsule Take 200 mg by mouth 2 (two) times daily as needed for mild constipation.   Yes Historical Provider, MD  gabapentin (NEURONTIN) 300 MG capsule Take 2 capsules (600 mg total) by mouth 3 (three) times daily. 08/03/15  Yes Demetrios Loll, MD  linaclotide [facility]) 290 MCG CAPS capsule Take 290 mcg by mouth daily before breakfast.   Yes Historical Provider, MD  lisinopril (PRINIVIL,ZESTRIL) 20 MG tablet Take 20 mg by mouth daily.   Yes Historical Provider, MD  magnesium oxide (MAG-OX) 400 (241.3 Mg) MG tablet Take 400 mg by mouth daily.   Yes Historical Provider, MD  metoprolol tartrate (LOPRESSOR) 25 MG tablet Take 1 tablet (25 mg total) by mouth 2 (two) times daily. 08/03/15  Yes Demetrios Loll, MD  morphine (MS CONTIN) 30 MG 12 hr tablet Take 30 mg by mouth 3 (three) times daily.   Yes Historical Provider, MD  nicotine (NICODERM CQ - DOSED IN MG/24 HOURS) 14 mg/24hr patch Place 1 patch (14 mg total) onto the skin daily. 06/08/15  Yes Gregor Hams, MD  nitroGLYCERIN (NITROSTAT) 0.4 MG SL tablet Place 1 tablet (0.4 mg total) under the tongue every 5 (five) minutes x 3 doses as needed for chest pain. 08/03/15  Yes Demetrios Loll, MD  ondansetron (ZOFRAN) 8 MG tablet Take 8 mg by mouth every 8 (eight) hours as needed for nausea or vomiting.   Yes Historical Provider, MD  Oxycodone HCl 10 MG TABS Take 10 mg by mouth 4 (four) times daily.   Yes Historical Provider, MD  potassium chloride SA (K-DUR,KLOR-CON) 20 MEQ tablet Take 20 mEq by mouth 2 (two) times daily.   Yes Historical Provider, MD  RANEXA 500 MG 12 hr tablet Take 1 tablet (500 mg total) by mouth 2 (two) times daily. 08/03/15  Yes Demetrios Loll, MD  tiZANidine (ZANAFLEX) 4 MG tablet Take 4 mg by  mouth every 8 (eight) hours as needed for muscle spasms.   Yes Historical Provider, MD  benzonatate (TESSALON) 200 MG capsule Take 1 capsule (200 mg total) by mouth 3 (three) times daily as needed for cough. Patient not taking: Reported on 10/09/2015 08/04/15   Earleen Newport, MD    REVIEW OF SYSTEMS:  Review of Systems  Constitutional: Negative for fever, chills, weight loss and malaise/fatigue.  HENT: Negative for ear pain, hearing loss and tinnitus.   Eyes: Negative for blurred vision, double vision, pain and redness.  Respiratory: Positive for  shortness of breath. Negative for cough and hemoptysis.   Cardiovascular: Positive for chest pain. Negative for palpitations, orthopnea and leg swelling.  Gastrointestinal: Negative for nausea, vomiting, abdominal pain, diarrhea and constipation.  Genitourinary: Negative for dysuria, frequency and hematuria.  Musculoskeletal: Negative for back pain, joint pain and neck pain.  Skin:       No acne, rash, or lesions  Neurological: Negative for dizziness, tremors, focal weakness and weakness.  Endo/Heme/Allergies: Negative for polydipsia. Does not bruise/bleed easily.  Psychiatric/Behavioral: Negative for depression. The patient is not nervous/anxious and does not have insomnia.      VITAL SIGNS:   Filed Vitals:   10/09/15 2011 10/09/15 2030 10/09/15 2130 10/09/15 2234  BP: 154/102 140/113 131/83 116/77  Pulse: 85 87 87 79  Temp:      TempSrc:      Resp: 18 23 20 16   Height:      Weight:      SpO2: 100% 98% 95% 96%   Wt Readings from Last 3 Encounters:  10/09/15 81.647 kg (180 lb)  08/04/15 80.74 kg (178 lb)  08/02/15 81.012 kg (178 lb 9.6 oz)    PHYSICAL EXAMINATION:  Physical Exam  Vitals reviewed. Constitutional: She is oriented to person, place, and time. She appears well-developed and well-nourished. No distress.  HENT:  Head: Normocephalic and atraumatic.  Mouth/Throat: Oropharynx is clear and moist.  Eyes: Conjunctivae  and EOM are normal. Pupils are equal, round, and reactive to light. No scleral icterus.  Neck: Normal range of motion. Neck supple. No JVD present. No thyromegaly present.  Cardiovascular: Normal rate, regular rhythm and intact distal pulses.  Exam reveals no gallop and no friction rub.   No murmur heard. Respiratory: Effort normal. No respiratory distress. She has no wheezes. She has rales (fine, bibasilar).  GI: Soft. Bowel sounds are normal. She exhibits no distension. There is no tenderness.  Musculoskeletal: Normal range of motion. She exhibits no edema.  No arthritis, no gout  Lymphadenopathy:    She has no cervical adenopathy.  Neurological: She is alert and oriented to person, place, and time. No cranial nerve deficit.  No dysarthria, no aphasia  Skin: Skin is warm and dry. No rash noted. No erythema.  Psychiatric: She has a normal mood and affect. Her behavior is normal. Judgment and thought content normal.    LABORATORY PANEL:   CBC  Recent Labs Lab 10/09/15 1716  WBC 8.6  HGB 13.4  HCT 40.0  PLT 267   ------------------------------------------------------------------------------------------------------------------  Chemistries   Recent Labs Lab 10/09/15 1813  NA 135  K 3.0*  CL 106  CO2 23  GLUCOSE 94  BUN 9  CREATININE 0.69  CALCIUM 8.3*   ------------------------------------------------------------------------------------------------------------------  Cardiac Enzymes  Recent Labs Lab 10/09/15 1813  TROPONINI <0.03   ------------------------------------------------------------------------------------------------------------------  RADIOLOGY:  Dg Chest 2 View  10/09/2015  CLINICAL DATA:  C/o chest pain x 2 days. Patient indicates pain is in epigastric region. Patient has been out of home medication sfor 1 month. 4 baby ASA taken PTA and NTG and zofran given by EMS. EXAM: CHEST  2 VIEW COMPARISON:  08/04/2015 FINDINGS: Linear subsegmental  atelectasis or scarring along the right hemidiaphragm. The lungs appear otherwise clear. Cardiac and mediastinal margins appear normal.  No pleural effusion. IMPRESSION: 1. Mild linear subsegmental atelectasis or scarring along the right hemidiaphragm. Electronically Signed   By: Van Clines M.D.   On: 10/09/2015 17:57   Ct Angio Chest Aorta W/cm &/or Wo/cm  10/09/2015  CLINICAL DATA:  Chest pain for 2 days EXAM: CT ANGIOGRAPHY CHEST WITH CONTRAST TECHNIQUE: Initially, axial CT images were obtained through the chest without intravenous contrast material administration. Multidetector CT imaging of the chest was performed using the standard protocol during bolus administration of intravenous contrast. Multiplanar CT image reconstructions and MIPs were obtained to evaluate the vascular anatomy. CONTRAST:  100 mL Isovue 370 nonionic COMPARISON:  Chest CT August 04, 2015; chest radiograph October 09, 2015 FINDINGS: Mediastinum/Lymph Nodes: There is no thoracic aortic aneurysm or dissection. Visualized great vessels appear normal. There is no demonstrable pulmonary embolus. There are scattered atherosclerotic calcifications in the aorta, primarily near the arch. There are multiple foci of coronary artery calcification. Pericardium is not thickened. Visualized thyroid appears unremarkable. There is no appreciable thoracic adenopathy. Lungs/Pleura: There are scattered foci of lower lobe atelectatic change bilaterally. There is a small area of tree-in-bud type appearance in the anterior segment of the right upper lobe, felt to represent localized pneumonia. Upper abdomen: In the visualized upper abdomen, there is hepatic steatosis. Visualized upper abdominal structures otherwise appear unremarkable except for mild atherosclerotic change in the aorta. Musculoskeletal: There are no blastic or lytic bone lesions. Review of the MIP images confirms the above findings. IMPRESSION: No demonstrable thoracic aortic aneurysm  or dissection. No pulmonary embolus. Small area of tree-in-bud type appearance in the right upper lobe anteriorly, best seen on axial slices 13, 14, 15, and 16, series 3, felt to represent a small focus of pneumonia. There is patchy atelectasis in both lower lobes. Lungs elsewhere clear. No adenopathy. Hepatic steatosis. Electronically Signed   By: Lowella Grip III M.D.   On: 10/09/2015 20:13    EKG:   Orders placed or performed during the hospital encounter of 10/09/15  . EKG 12-Lead  . EKG 12-Lead  . EKG 12-Lead  . EKG 12-Lead  . ED EKG within 10 minutes  . ED EKG within 10 minutes    IMPRESSION AND PLAN:  Principal Problem:   Chest pain - we will admit to telemetry for further evaluation. Trend cardiac enzymes, get echocardiogram in the morning as well as a cardiology consult. EKG per my read shows anteroseptal Q waves, but no acute ischemic findings. Restart home meds including long-acting nitrate and Ranexa. Active Problems:   Hypertensive urgency - blood pressure much improved with the treatment here with antihypertensives and nitro paste. We will restart her meds that she was on for blood pressure.   Non-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/2014 - continue meds for CAD   COPD (chronic obstructive pulmonary disease) ([place]) - we'll order her home dose inhalers   Hyperlipidemia - home dose antilipid   GERD (gastroesophageal reflux disease) - home dose PPI  All the records are reviewed and case discussed with ED provider. Management plans discussed with the patient and/or family.  DVT PROPHYLAXIS: SubQ lovenox  GI PROPHYLAXIS: PPI  ADMISSION STATUS: Observation  CODE STATUS: Full Code Status History    Date Active Date Inactive Code Status Order ID Comments User Context   08/02/2015  4:30 AM 08/03/2015  5:07 PM Full Code [id number]  Saundra Shelling, MD Inpatient      TOTAL TIME TAKING CARE OF THIS PATIENT: 40 minutes.    Sara Munoz 10/09/2015, 11:00 PM  [facility]  Office  [phone]  CC: Primary care physician; No PCP Per Patient

## 2015-10-10 ENCOUNTER — Telehealth: Payer: Self-pay | Admitting: *Deleted

## 2015-10-10 DIAGNOSIS — I2510 Atherosclerotic heart disease of native coronary artery without angina pectoris: Secondary | ICD-10-CM

## 2015-10-10 DIAGNOSIS — I10 Essential (primary) hypertension: Secondary | ICD-10-CM | POA: Diagnosis not present

## 2015-10-10 DIAGNOSIS — J432 Centrilobular emphysema: Secondary | ICD-10-CM

## 2015-10-10 DIAGNOSIS — E876 Hypokalemia: Secondary | ICD-10-CM | POA: Insufficient documentation

## 2015-10-10 DIAGNOSIS — R079 Chest pain, unspecified: Secondary | ICD-10-CM

## 2015-10-10 DIAGNOSIS — E785 Hyperlipidemia, unspecified: Secondary | ICD-10-CM

## 2015-10-10 LAB — CBC
HCT: 37.2 % (ref 35.0–47.0)
HCT: 37.4 % (ref 35.0–47.0)
Hemoglobin: 12.6 g/dL (ref 12.0–16.0)
Hemoglobin: 12.6 g/dL (ref 12.0–16.0)
MCH: 27.9 pg (ref 26.0–34.0)
MCH: 28.2 pg (ref 26.0–34.0)
MCHC: 33.7 g/dL (ref 32.0–36.0)
MCHC: 33.9 g/dL (ref 32.0–36.0)
MCV: 82.9 fL (ref 80.0–100.0)
MCV: 83.4 fL (ref 80.0–100.0)
Platelets: 235 10*3/uL (ref 150–440)
Platelets: 252 10*3/uL (ref 150–440)
RBC: 4.45 MIL/uL (ref 3.80–5.20)
RBC: 4.51 MIL/uL (ref 3.80–5.20)
RDW: 16.2 % — ABNORMAL HIGH (ref 11.5–14.5)
RDW: 16.2 % — ABNORMAL HIGH (ref 11.5–14.5)
WBC: 6.9 10*3/uL (ref 3.6–11.0)
WBC: 7.9 10*3/uL (ref 3.6–11.0)

## 2015-10-10 LAB — BASIC METABOLIC PANEL
Anion gap: 6 (ref 5–15)
BUN: 10 mg/dL (ref 6–20)
CO2: 24 mmol/L (ref 22–32)
Calcium: 8.6 mg/dL — ABNORMAL LOW (ref 8.9–10.3)
Chloride: 105 mmol/L (ref 101–111)
Creatinine, Ser: 0.91 mg/dL (ref 0.44–1.00)
GFR calc Af Amer: >60 mL/min (ref >60)
GFR calc non Af Amer: >60 mL/min (ref >60)
Glucose, Bld: 95 mg/dL (ref 65–99)
Potassium: 3.3 mmol/L — ABNORMAL LOW (ref 3.5–5.1)
Sodium: 135 mmol/L (ref 135–145)

## 2015-10-10 LAB — CREATININE, SERUM
Creatinine, Ser: 0.77 mg/dL (ref 0.44–1.00)
GFR calc Af Amer: >60 mL/min (ref >60)
GFR calc non Af Amer: >60 mL/min (ref >60)

## 2015-10-10 LAB — TROPONIN I
Troponin I: <0.03 ng/mL (ref <0.031)
Troponin I: <0.03 ng/mL (ref <0.031)

## 2015-10-10 MED ORDER — ATORVASTATIN CALCIUM 20 MG PO TABS
20.0000 mg | ORAL_TABLET | Freq: Every day | ORAL | Status: DC
  Administered 2015-10-10: 20 mg via ORAL
  Filled 2015-10-10: qty 1

## 2015-10-10 MED ORDER — ACETAMINOPHEN 650 MG RE SUPP
650.0000 mg | Freq: Four times a day (QID) | RECTAL | Status: DC | PRN
Start: 2015-10-10 — End: 2015-10-10

## 2015-10-10 MED ORDER — CLOPIDOGREL BISULFATE 75 MG PO TABS
75.0000 mg | ORAL_TABLET | Freq: Every day | ORAL | Status: DC
  Administered 2015-10-10: 75 mg via ORAL
  Filled 2015-10-10: qty 1

## 2015-10-10 MED ORDER — ALPRAZOLAM 0.25 MG PO TABS: 0.5000 mg | ORAL_TABLET | Freq: Three times a day (TID) | ORAL | Status: DC | PRN

## 2015-10-10 MED ORDER — ATENOLOL 25 MG PO TABS
50.0000 mg | ORAL_TABLET | Freq: Every day | ORAL | Status: DC
  Filled 2015-10-10: qty 2

## 2015-10-10 MED ORDER — LISINOPRIL 20 MG PO TABS
20.0000 mg | ORAL_TABLET | Freq: Every day | ORAL | Status: DC
Start: 2015-10-10 — End: 2015-10-10
  Filled 2015-10-10: qty 1

## 2015-10-10 MED ORDER — CLONAZEPAM 1 MG PO TABS
1.0000 mg | ORAL_TABLET | Freq: Two times a day (BID) | ORAL | Status: DC
  Administered 2015-10-10 (×2): 1 mg via ORAL
  Filled 2015-10-10 (×2): qty 1

## 2015-10-10 MED ORDER — AZITHROMYCIN 250 MG PO TABS: ORAL_TABLET | ORAL | Status: DC

## 2015-10-10 MED ORDER — RANOLAZINE ER 500 MG PO TB12
500.0000 mg | ORAL_TABLET | Freq: Two times a day (BID) | ORAL | Status: DC
  Administered 2015-10-10 (×2): 500 mg via ORAL
  Filled 2015-10-10 (×2): qty 1

## 2015-10-10 MED ORDER — ONDANSETRON HCL 4 MG/2ML IJ SOLN
4.0000 mg | Freq: Four times a day (QID) | INTRAMUSCULAR | Status: DC | PRN
  Administered 2015-10-10: 4 mg via INTRAVENOUS
  Filled 2015-10-10: qty 2

## 2015-10-10 MED ORDER — MOMETASONE FURO-FORMOTEROL FUM 200-5 MCG/ACT IN AERO
2.0000 | INHALATION_SPRAY | Freq: Two times a day (BID) | RESPIRATORY_TRACT | Status: DC
  Administered 2015-10-10: 2 via RESPIRATORY_TRACT
  Filled 2015-10-10: qty 8.8

## 2015-10-10 MED ORDER — PANTOPRAZOLE SODIUM 40 MG PO TBEC
40.0000 mg | DELAYED_RELEASE_TABLET | Freq: Every day | ORAL | Status: DC
  Administered 2015-10-10: 40 mg via ORAL
  Filled 2015-10-10: qty 1

## 2015-10-10 MED ORDER — ENOXAPARIN SODIUM 40 MG/0.4ML ~~LOC~~ SOLN
40.0000 mg | SUBCUTANEOUS | Status: DC
  Administered 2015-10-10: 40 mg via SUBCUTANEOUS
  Filled 2015-10-10: qty 0.4

## 2015-10-10 MED ORDER — ONDANSETRON HCL 4 MG PO TABS: 4.0000 mg | ORAL_TABLET | Freq: Four times a day (QID) | ORAL | Status: DC | PRN

## 2015-10-10 MED ORDER — ASPIRIN EC 81 MG PO TBEC
81.0000 mg | DELAYED_RELEASE_TABLET | Freq: Every day | ORAL | Status: DC
  Administered 2015-10-10: 81 mg via ORAL
  Filled 2015-10-10: qty 1

## 2015-10-10 MED ORDER — GUAIFENESIN ER 600 MG PO TB12: 600.0000 mg | ORAL_TABLET | Freq: Two times a day (BID) | ORAL | Status: DC

## 2015-10-10 MED ORDER — MORPHINE SULFATE ER 30 MG PO TBCR
30.0000 mg | EXTENDED_RELEASE_TABLET | Freq: Two times a day (BID) | ORAL | Status: DC
  Administered 2015-10-10 (×2): 30 mg via ORAL
  Filled 2015-10-10 (×2): qty 1

## 2015-10-10 MED ORDER — ACETAMINOPHEN 325 MG PO TABS
650.0000 mg | ORAL_TABLET | Freq: Four times a day (QID) | ORAL | Status: DC | PRN
  Administered 2015-10-10: 650 mg via ORAL
  Filled 2015-10-10: qty 2

## 2015-10-10 MED ORDER — SODIUM CHLORIDE 0.9% FLUSH
3.0000 mL | Freq: Two times a day (BID) | INTRAVENOUS | Status: DC
  Administered 2015-10-10: 3 mL via INTRAVENOUS

## 2015-10-10 NOTE — Consult Note (Signed)
Cardiology Consultation Note  Patient ID: Sara Munoz, MRN: [id number], DOB/AGE: 30-Nov-1965 50 y.o. Admit date: 10/09/2015   Date of Consult: 10/10/2015 Primary Physician: No PCP Per Patient Primary Cardiologist: New to [facility], previously follwed by [facility] cardiologist, previously consulted on by Dr. Ellyn Hack, MD never seen in clinic  Chief Complaint: Ran out of medications, incidental mention of chest pain Reason for Consult: Chest pain  HPI: 50 y.o. female with h/o nonobstructive CAD by cardiac catheterization in 06/2014, carotid artery disease, HTN, HLD, chronic back pain on several narcotic pain medications, obesity, COPD and ongoing tobacco abuse who presented to [facility] ED for refills of all her medications as she had been out for several months and mentioned an incidental chest pain.   Patient has undergone numerous nuclear stress tests over the years and most recently, she underwent a cardiac catheterization in 06/2014 that showed mild-nonobstructive CAD with 10-20% stenosis in the left main, 25% stenosis in the LAD, 30-40% stenosis in the LCx, and 20-30% stenosis in the RCA. The note made a comparison to a prior cath in 2011 saying there was no significant change in her CAD. She has moved to the [place] area from [facility] to be near her boyfriend. Since moving here several months ago she has found it difficult to get established with a PCP. She was admitted to [facility] 07/2014 with atypical chest pain. She ruled out and EKG was non-acute. At that time her chest pain was reproducible to palpation and no further work up was advised.   She returned to [facility] as above with a 2 day history of left-sided chest pain, not associated with activity. No associated symptoms. Of note she has had a cough for approximately 1 month that has been nonproductive. Her chest pain is reproducible when she presses on her own chest. Lastly, patient has been on longstanding potassium supplement has been out of  this for 2 months.   Upon the patient's arrival to [facility] they were found to have negative troponin x 3, K+ 3.0-->3.3, . ECG showed NSR, 96 bpm, low voltage precordial leads, possible old anteroseptal infarct, no acute st/t changes, CXR showed mild linear subsegmental atelectasis or scarring along the right hemidiaphragm. CTA chest/aorta was done and showed no demonstrable thoracic aortic aneurysm, no PE. There was a possible area of pneumonia along the right upper lobe.     Past Medical History  Diagnosis Date  . Hypertension   . Coronary artery disease     a. Mild-non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/2014  . COPD (chronic obstructive pulmonary disease) ([place])   . Hypercholesteremia   . Chronic back pain   . MI (myocardial infarction) ([place])       Most Recent Cardiac Studies: Cardiac cath 07/07/2014: Aortic Pressure: 120/73, mean 94 Left Ventricular Pressure: 140/9. There was no aortic valve gradient at the time of catheter pullback.  Left Ventriculography: Normal chamber size. Frequent ectopic beats. Contractility is normal. EF 65%. No significant MR   Coronary Angiography: Left Main: Normal caliber segment. Focal eccentric mid 10-20 percent plaque. The left main divides into the LAD, LCx LAD: Medium caliber vessel. The proximal and mid segments have some 25% irregular plaque. The distal vessel is normal. 2 medium caliber diagonal branches arise from the proximal third of the LAD and have minor wall irregularities. LCX: Dominant vessel. The proximal segment is normal. It provides OM1. It has ostial and proximal 30-40 percent narrowing. The mid vessel provides OM 2 which is normal.  The distal PDA branch has minor luminal irregularities. RCA: Small caliber nondominant vessel. It does not appear to supply left ventricular myocardium. There is a focal eccentric 20-30 percent mid vessel plaque.  Impression:  Left dominant coronary anatomy  Angiographic mild nonobstructive coronary  atherosclerosis. No significant changes compared with her last study 07/28/2009.  Normal left ventricular size and contractility, EF 65%   Surgical History:  Past Surgical History  Procedure Laterality Date  . Orif femur fracture    . Orif tibia & fibula fractures    . Hemorroids    . Ovary surgery       Home Meds: Prior to Admission medications   Medication Sig Start Date End Date Taking? Authorizing Provider  albuterol (PROVENTIL HFA;VENTOLIN HFA) 108 (90 Base) MCG/ACT inhaler Inhale 2 puffs into the lungs every 4 (four) hours as needed for wheezing or shortness of breath. 08/03/15  Yes Demetrios Loll, MD  ALPRAZolam Duanne Moron) 0.5 MG tablet Take 0.5 mg by mouth 3 (three) times daily as needed for anxiety.   Yes Historical Provider, MD  aspirin EC 81 MG tablet Take 81 mg by mouth daily.   Yes Historical Provider, MD  atenolol (TENORMIN) 50 MG tablet Take 50 mg by mouth daily.   Yes Historical Provider, MD  atorvastatin (LIPITOR) 20 MG tablet Take 20 mg by mouth at bedtime.   Yes Historical Provider, MD  budesonide-formoterol (SYMBICORT) 160-4.5 MCG/ACT inhaler Inhale 2 puffs into the lungs 2 (two) times daily. 05/31/15  Yes Gregor Hams, MD  clonazePAM (KLONOPIN) 1 MG tablet Take 1 tablet by mouth 2 (two) times daily.    Yes Historical Provider, MD  clopidogrel (PLAVIX) 75 MG tablet Take 75 mg by mouth daily.   Yes Historical Provider, MD  dexlansoprazole (DEXILANT) 60 MG capsule Take 60 mg by mouth daily.   Yes Historical Provider, MD  diazepam (VALIUM) 2 MG tablet Take 1 tablet (2 mg total) by mouth every 8 (eight) hours as needed for anxiety. 08/03/15  Yes Demetrios Loll, MD  docusate sodium (COLACE) 100 MG capsule Take 200 mg by mouth 2 (two) times daily as needed for mild constipation.   Yes Historical Provider, MD  gabapentin (NEURONTIN) 300 MG capsule Take 2 capsules (600 mg total) by mouth 3 (three) times daily. 08/03/15  Yes Demetrios Loll, MD  linaclotide [facility]) 290 MCG CAPS capsule Take 290 mcg  by mouth daily before breakfast.   Yes Historical Provider, MD  lisinopril (PRINIVIL,ZESTRIL) 20 MG tablet Take 20 mg by mouth daily.   Yes Historical Provider, MD  magnesium oxide (MAG-OX) 400 (241.3 Mg) MG tablet Take 400 mg by mouth daily.   Yes Historical Provider, MD  metoprolol tartrate (LOPRESSOR) 25 MG tablet Take 1 tablet (25 mg total) by mouth 2 (two) times daily. 08/03/15  Yes Demetrios Loll, MD  morphine (MS CONTIN) 30 MG 12 hr tablet Take 30 mg by mouth 3 (three) times daily.   Yes Historical Provider, MD  nicotine (NICODERM CQ - DOSED IN MG/24 HOURS) 14 mg/24hr patch Place 1 patch (14 mg total) onto the skin daily. 06/08/15  Yes Gregor Hams, MD  nitroGLYCERIN (NITROSTAT) 0.4 MG SL tablet Place 1 tablet (0.4 mg total) under the tongue every 5 (five) minutes x 3 doses as needed for chest pain. 08/03/15  Yes Demetrios Loll, MD  ondansetron (ZOFRAN) 8 MG tablet Take 8 mg by mouth every 8 (eight) hours as needed for nausea or vomiting.   Yes Historical Provider, MD  Oxycodone  HCl 10 MG TABS Take 10 mg by mouth 4 (four) times daily.   Yes Historical Provider, MD  potassium chloride SA (K-DUR,KLOR-CON) 20 MEQ tablet Take 20 mEq by mouth 2 (two) times daily.   Yes Historical Provider, MD  RANEXA 500 MG 12 hr tablet Take 1 tablet (500 mg total) by mouth 2 (two) times daily. 08/03/15  Yes Demetrios Loll, MD  tiZANidine (ZANAFLEX) 4 MG tablet Take 4 mg by mouth every 8 (eight) hours as needed for muscle spasms.   Yes Historical Provider, MD  benzonatate (TESSALON) 200 MG capsule Take 1 capsule (200 mg total) by mouth 3 (three) times daily as needed for cough. Patient not taking: Reported on 10/09/2015 08/04/15   Earleen Newport, MD    Inpatient Medications:  . aspirin EC  81 mg Oral Daily  . atenolol  50 mg Oral Daily  . atorvastatin  20 mg Oral QHS  . clonazePAM  1 mg Oral BID  . clopidogrel  75 mg Oral Daily  . enoxaparin (LOVENOX) injection  40 mg Subcutaneous Q24H  . lisinopril  20 mg Oral Daily  .  mometasone-formoterol  2 puff Inhalation BID  . morphine  30 mg Oral Q12H  . pantoprazole  40 mg Oral Daily  . ranolazine  500 mg Oral BID  . sodium chloride flush  3 mL Intravenous Q12H      Allergies:  Allergies  Allergen Reactions  . Flexeril [Cyclobenzaprine] Hives, Swelling and Other (See Comments)    Reaction:  Facial/lip swelling   . Keflex [Cephalexin] Hives  . Levofloxacin Hives  . Toradol [Ketorolac Tromethamine] Swelling and Other (See Comments)    Reaction:  Facial/tongue swelling   . Tramadol Hives, Swelling and Other (See Comments)    Reaction:  Lip swelling   . Zoloft [Sertraline Hcl] Swelling and Other (See Comments)    Reaction:  Tongue swelling     Social History   Social History  . Marital Status: Single    Spouse Name: N/A  . Number of Children: N/A  . Years of Education: N/A   Occupational History  . disabled    Social History Main Topics  . Smoking status: Current Every Day Smoker -- 1.00 packs/day    Types: Cigarettes  . Smokeless tobacco: Never Used  . Alcohol Use: 0.0 oz/week    0 Standard drinks or equivalent per week     Comment: drinks occasionally  . Drug Use: No  . Sexual Activity: Yes   Other Topics Concern  . Not on file   Social History Narrative   Moved from [place].     Family History  Problem Relation Age of Onset  . Heart disease Mother   . Lung cancer Mother   . Ovarian cancer Mother      Review of Systems: Review of Systems  Constitutional: Positive for malaise/fatigue. Negative for fever, chills, weight loss and diaphoresis.  HENT: Negative for congestion.   Respiratory: Positive for cough and wheezing. Negative for hemoptysis, sputum production and shortness of breath.   Cardiovascular: Positive for chest pain. Negative for palpitations, orthopnea, claudication, leg swelling and PND.  Gastrointestinal: Negative for nausea, vomiting and abdominal pain.  Musculoskeletal: Negative for myalgias and falls.  Skin:  Negative for rash.  Neurological: Positive for weakness. Negative for dizziness, tingling, tremors, sensory change, speech change, focal weakness and loss of consciousness.  Endo/Heme/Allergies: Does not bruise/bleed easily.  Psychiatric/Behavioral: Positive for substance abuse. The patient is not nervous/anxious.  Ongoing tobacco abuse  All other systems reviewed and are negative.    Labs:  Recent Labs  10/09/15 1813 10/10/15 0041 10/10/15 0442  TROPONINI <0.03 <0.03 <0.03   Lab Results  Component Value Date   WBC 6.9 10/10/2015   HGB 12.6 10/10/2015   HCT 37.2 10/10/2015   MCV 83.4 10/10/2015   PLT 235 10/10/2015    Recent Labs Lab 10/10/15 0442  NA 135  K 3.3*  CL 105  CO2 24  BUN 10  CREATININE 0.91  CALCIUM 8.6*  GLUCOSE 95   Lab Results  Component Value Date   CHOL 149 08/02/2015   HDL 28* 08/02/2015   LDLCALC 97 08/02/2015   TRIG 118 08/02/2015   No results found for: DDIMER  Radiology/Studies:  Dg Chest 2 View  10/09/2015  CLINICAL DATA:  C/o chest pain x 2 days. Patient indicates pain is in epigastric region. Patient has been out of home medication sfor 1 month. 4 baby ASA taken PTA and NTG and zofran given by EMS. EXAM: CHEST  2 VIEW COMPARISON:  08/04/2015 FINDINGS: Linear subsegmental atelectasis or scarring along the right hemidiaphragm. The lungs appear otherwise clear. Cardiac and mediastinal margins appear normal.  No pleural effusion. IMPRESSION: 1. Mild linear subsegmental atelectasis or scarring along the right hemidiaphragm. Electronically Signed   By: Van Clines M.D.   On: 10/09/2015 17:57   Ct Angio Chest Aorta W/cm &/or Wo/cm  10/09/2015  CLINICAL DATA:  Chest pain for 2 days EXAM: CT ANGIOGRAPHY CHEST WITH CONTRAST TECHNIQUE: Initially, axial CT images were obtained through the chest without intravenous contrast material administration. Multidetector CT imaging of the chest was performed using the standard protocol during bolus  administration of intravenous contrast. Multiplanar CT image reconstructions and MIPs were obtained to evaluate the vascular anatomy. CONTRAST:  100 mL Isovue 370 nonionic COMPARISON:  Chest CT August 04, 2015; chest radiograph October 09, 2015 FINDINGS: Mediastinum/Lymph Nodes: There is no thoracic aortic aneurysm or dissection. Visualized great vessels appear normal. There is no demonstrable pulmonary embolus. There are scattered atherosclerotic calcifications in the aorta, primarily near the arch. There are multiple foci of coronary artery calcification. Pericardium is not thickened. Visualized thyroid appears unremarkable. There is no appreciable thoracic adenopathy. Lungs/Pleura: There are scattered foci of lower lobe atelectatic change bilaterally. There is a small area of tree-in-bud type appearance in the anterior segment of the right upper lobe, felt to represent localized pneumonia. Upper abdomen: In the visualized upper abdomen, there is hepatic steatosis. Visualized upper abdominal structures otherwise appear unremarkable except for mild atherosclerotic change in the aorta. Musculoskeletal: There are no blastic or lytic bone lesions. Review of the MIP images confirms the above findings. IMPRESSION: No demonstrable thoracic aortic aneurysm or dissection. No pulmonary embolus. Small area of tree-in-bud type appearance in the right upper lobe anteriorly, best seen on axial slices 13, 14, 15, and 16, series 3, felt to represent a small focus of pneumonia. There is patchy atelectasis in both lower lobes. Lungs elsewhere clear. No adenopathy. Hepatic steatosis. Electronically Signed   By: Lowella Grip III M.D.   On: 10/09/2015 20:13    EKG: NSR, 96 bpm, low voltage precordial leads, possible old anteroseptal infarct, no acute st/t changes  Weights: Filed Weights   10/09/15 1709 10/10/15 0005  Weight: 180 lb (81.647 kg) 182 lb 14.4 oz (82.963 kg)     Physical Exam: Blood pressure 91/54, pulse  62, temperature 97.8 F (36.6 C), temperature source Oral, resp. rate 18,  height 5\' 4"  (1.626 m), weight 182 lb 14.4 oz (82.963 kg), last menstrual period 09/16/2015, SpO2 99 %. Body mass index is 31.38 kg/(m^2). General: Well developed, well nourished, in no acute distress. Head: Normocephalic, atraumatic, sclera non-icteric, no xanthomas, nares are without discharge.  Neck: Negative for carotid bruits. JVD not elevated. Lungs: Clear bilaterally to auscultation without wheezes, rales, or rhonchi. Breathing is unlabored. Heart: RRR with S1 S2. No murmurs, rubs, or gallops appreciated. Chest pain is reproducible to palpation.  Abdomen: Obese, soft, non-tender, non-distended with normoactive bowel sounds. No hepatomegaly. No rebound/guarding. No obvious abdominal masses. Msk:  Strength and tone appear normal for age. Extremities: No clubbing or cyanosis. No edema.  Distal pedal pulses are 2+ and equal bilaterally. Neuro: Alert and oriented X 3. No facial asymmetry. No focal deficit. Moves all extremities spontaneously. Psych:  Responds to questions appropriately with a normal affect.    Assessment and Plan:   1. Atypical chest pain: -Pain is reproducible to palpation on exam -Secondly, patient's potassium was found to be low at 3.0 upon admission increasing the possibility of muscle spasm as she describes her symptoms as spasm-like -Lastly, she was found to have a possible right upper lobe pneumonia on CTA chest, and she had a cough x 1 month -Negative troponin, EKG non-acute, cardiac cath 07/07/2014 with mild-nonobstructive CAD -Would treat the above issues -No plans for ischemic work up at this time  2. Nonobstructive CAD: -Cardiac cath as above -Previously on Plavix and aspirin prior to admission in February 2017 -It was advised Plavix be discontinued, this was at the time of discharge; though this remains on her prior to admission medications -No clear indication for her to be on both  Plavix and aspirin -Would just take aspirin 81 mg daily -Would change atenolol to Coreg vs metoprolol at discharge  -Ranexa   3. HTN: -As above -Continue lisinopril   4. HLD: -Lipitor   5. Hypokalemia: -Replete to 4.0 -As above  6. Polypharmacy: -Needs PCP/pain management   7. COPD/tobacco abuse: -Cessation advised  8. Carotid artery disease: -Can follow up as an outpatient   Signed, Christell Faith, [name] Pager: [phone] 10/10/2015, 8:25 AM

## 2015-10-10 NOTE — Discharge Instructions (Signed)
Stop smoking

## 2015-10-10 NOTE — Progress Notes (Signed)
Discharge instructions given. Prescriptions written by Dr. Posey Pronto and given to patient as well as two prescriptions sent to pharmacist. Education given on chest pain. No questions at this time. Patient's boyfriend will be coming to get her, but she has not gotten in touch with him yet. Will leave IV and tele until she reaches ride.

## 2015-10-10 NOTE — Discharge Summary (Signed)
[facility] NAME: Sara Munoz    MR#:  [id number]  DATE OF BIRTH:  March 19, 1966  DATE OF ADMISSION:  10/09/2015 ADMITTING PHYSICIAN: Lance Coon, MD  DATE OF DISCHARGE: 10/10/15  PRIMARY CARE PHYSICIAN: No PCP Per Patient    ADMISSION DIAGNOSIS:  Chest pain, unspecified [R07.9] Hypokalemia [E87.6] Hypertensive urgency [I16.0] Essential hypertension [I10]  DISCHARGE DIAGNOSIS:  Chest pain-atypical H/o mild CAD per cath in 06/2014 Chronic narcotic dependence Acute bronchitis Tobacco abuse  SECONDARY DIAGNOSIS:   Past Medical History  Diagnosis Date  . Hypertension   . Coronary artery disease     a. Mild-non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/2014  . COPD (chronic obstructive pulmonary disease) ([place])   . Hypercholesteremia   . Chronic back pain   . MI (myocardial infarction) [facility])     HOSPITAL COURSE:   #Chest pain - we will admit to telemetry for further evaluation.negative cardiac enzymes, -had echo in feb 2017, no need to repeat -atypical pain. No futher w/u per cardiology -pt ran out of meds for 3 weeks including pain meds  # dry cough with CT chest possible atelectasis -z-pak  # Hypertensive urgency - blood pressure much improved with the treatment here with antihypertensives and nitro paste. We will restart her meds that she was on for blood pressure. -bp much better  # Non-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/2014 - continue meds for CAD  # COPD (chronic obstructive pulmonary disease) ([place]) - we'll order her home dose inhalers -pt advised smoking cessation-3 mins spent  #Hyperlipidemia - home dose antilipid   #GERD (gastroesophageal reflux disease) - home dose PPI  Overall stable d/c home  CONSULTS OBTAINED:     DRUG ALLERGIES:   Allergies  Allergen Reactions  . Flexeril [Cyclobenzaprine] Hives, Swelling and Other (See Comments)    Reaction:  Facial/lip swelling   . Keflex [Cephalexin] Hives   . Levofloxacin Hives  . Toradol [Ketorolac Tromethamine] Swelling and Other (See Comments)    Reaction:  Facial/tongue swelling   . Tramadol Hives, Swelling and Other (See Comments)    Reaction:  Lip swelling   . Zoloft [Sertraline Hcl] Swelling and Other (See Comments)    Reaction:  Tongue swelling     DISCHARGE MEDICATIONS:   Current Discharge Medication List    START taking these medications   Details  azithromycin (ZITHROMAX Z-PAK) 250 MG tablet Take as  directed Qty: 6 each, Refills: 0    guaiFENesin (MUCINEX) 600 MG 12 hr tablet Take 1 tablet (600 mg total) by mouth 2 (two) times daily. Qty: 15 tablet, Refills: 0      CONTINUE these medications which have NOT CHANGED   Details  albuterol (PROVENTIL HFA;VENTOLIN HFA) 108 (90 Base) MCG/ACT inhaler Inhale 2 puffs into the lungs every 4 (four) hours as needed for wheezing or shortness of breath. Qty: 1 Inhaler, Refills: 0    ALPRAZolam (XANAX) 0.5 MG tablet Take 0.5 mg by mouth 3 (three) times daily as needed for anxiety.    aspirin EC 81 MG tablet Take 81 mg by mouth daily.    atenolol (TENORMIN) 50 MG tablet Take 50 mg by mouth daily.    atorvastatin (LIPITOR) 20 MG tablet Take 20 mg by mouth at bedtime.    budesonide-formoterol (SYMBICORT) 160-4.5 MCG/ACT inhaler Inhale 2 puffs into the lungs 2 (two) times daily. Qty: 1 Inhaler, Refills: 0    clonazePAM (KLONOPIN) 1 MG tablet Take 1 tablet by mouth 2 (  two) times daily.  Refills: 0    dexlansoprazole (DEXILANT) 60 MG capsule Take 60 mg by mouth daily.    diazepam (VALIUM) 2 MG tablet Take 1 tablet (2 mg total) by mouth every 8 (eight) hours as needed for anxiety. Qty: 15 tablet, Refills: 0    docusate sodium (COLACE) 100 MG capsule Take 200 mg by mouth 2 (two) times daily as needed for mild constipation.    gabapentin (NEURONTIN) 300 MG capsule Take 2 capsules (600 mg total) by mouth 3 (three) times daily. Qty: 180 capsule, Refills: 0    linaclotide (LINZESS)  290 MCG CAPS capsule Take 290 mcg by mouth daily before breakfast.    lisinopril (PRINIVIL,ZESTRIL) 20 MG tablet Take 20 mg by mouth daily.    magnesium oxide (MAG-OX) 400 (241.3 Mg) MG tablet Take 400 mg by mouth daily.    metoprolol tartrate (LOPRESSOR) 25 MG tablet Take 1 tablet (25 mg total) by mouth 2 (two) times daily. Qty: 60 tablet, Refills: 2    morphine (MS CONTIN) 30 MG 12 hr tablet Take 30 mg by mouth 3 (three) times daily.    nicotine (NICODERM CQ - DOSED IN MG/24 HOURS) 14 mg/24hr patch Place 1 patch (14 mg total) onto the skin daily. Qty: 30 patch, Refills: 0    nitroGLYCERIN (NITROSTAT) 0.4 MG SL tablet Place 1 tablet (0.4 mg total) under the tongue every 5 (five) minutes x 3 doses as needed for chest pain. Qty: 30 tablet, Refills: 2    ondansetron (ZOFRAN) 8 MG tablet Take 8 mg by mouth every 8 (eight) hours as needed for nausea or vomiting.    Oxycodone HCl 10 MG TABS Take 10 mg by mouth 4 (four) times daily.    potassium chloride SA (K-DUR,KLOR-CON) 20 MEQ tablet Take 20 mEq by mouth 2 (two) times daily.    RANEXA 500 MG 12 hr tablet Take 1 tablet (500 mg total) by mouth 2 (two) times daily. Qty: 60 tablet, Refills: 2    tiZANidine (ZANAFLEX) 4 MG tablet Take 4 mg by mouth every 8 (eight) hours as needed for muscle spasms.      STOP taking these medications     clopidogrel (PLAVIX) 75 MG tablet      benzonatate (TESSALON) 200 MG capsule         If you experience worsening of your admission symptoms, develop shortness of breath, life threatening emergency, suicidal or homicidal thoughts you must seek medical attention immediately by calling 911 or calling your MD immediately  if symptoms less severe.  You Must read complete instructions/literature along with all the possible adverse reactions/side effects for all the Medicines you take and that have been prescribed to you. Take any new Medicines after you have completely understood and accept all the possible  adverse reactions/side effects.   Please note  You were cared for by a hospitalist during your hospital stay. If you have any questions about your discharge medications or the care you received while you were in the hospital after you are discharged, you can call the unit and asked to speak with the hospitalist on call if the hospitalist that took care of you is not available. Once you are discharged, your primary care physician will handle any further medical issues. Please note that NO REFILLS for any discharge medications will be authorized once you are discharged, as it is imperative that you return to your primary care physician (or establish a relationship with a primary care physician if you  do not have one) for your aftercare needs so that they can reassess your need for medications and monitor your lab values. Today   SUBJECTIVE   Dry cough and chest pain with cough  VITAL SIGNS:  Blood pressure 102/67, pulse 69, temperature 97.8 F (36.6 C), temperature source Oral, resp. rate 18, height 5\' 4"  (1.626 m), weight 82.963 kg (182 lb 14.4 oz), last menstrual period 09/16/2015, SpO2 98 %.  I/O:    Intake/Output Summary (Last 24 hours) at 10/10/15 0924 Last data filed at 10/10/15 0440  Gross per 24 hour  Intake      0 ml  Output      0 ml  Net      0 ml    PHYSICAL EXAMINATION:  GENERAL:  50 y.o.-year-old patient lying in the bed with no acute distress.  EYES: Pupils equal, round, reactive to light and accommodation. No scleral icterus. Extraocular muscles intact.  HEENT: Head atraumatic, normocephalic. Oropharynx and nasopharynx clear.  NECK:  Supple, no jugular venous distention. No thyroid enlargement, no tenderness.  LUNGS: Normal breath sounds bilaterally, no wheezing, rales,rhonchi or crepitation. No use of accessory muscles of respiration.  CARDIOVASCULAR: S1, S2 normal. No murmurs, rubs, or gallops.  ABDOMEN: Soft, non-tender, non-distended. Bowel sounds present. No  organomegaly or mass.  EXTREMITIES: No pedal edema, cyanosis, or clubbing.  NEUROLOGIC: Cranial nerves II through XII are intact. Muscle strength 5/5 in all extremities. Sensation intact. Gait not checked.  PSYCHIATRIC: The patient is alert and oriented x 3.  SKIN: No obvious rash, lesion, or ulcer.   DATA REVIEW:   CBC   Recent Labs Lab 10/10/15 0442  WBC 6.9  HGB 12.6  HCT 37.2  PLT 235    Chemistries   Recent Labs Lab 10/10/15 0442  NA 135  K 3.3*  CL 105  CO2 24  GLUCOSE 95  BUN 10  CREATININE 0.91  CALCIUM 8.6*    Microbiology Results   No results found for this or any previous visit (from the past 240 hour(s)).  RADIOLOGY:  Dg Chest 2 View  10/09/2015  CLINICAL DATA:  C/o chest pain x 2 days. Patient indicates pain is in epigastric region. Patient has been out of home medication sfor 1 month. 4 baby ASA taken PTA and NTG and zofran given by EMS. EXAM: CHEST  2 VIEW COMPARISON:  08/04/2015 FINDINGS: Linear subsegmental atelectasis or scarring along the right hemidiaphragm. The lungs appear otherwise clear. Cardiac and mediastinal margins appear normal.  No pleural effusion. IMPRESSION: 1. Mild linear subsegmental atelectasis or scarring along the right hemidiaphragm. Electronically Signed   By: Van Clines M.D.   On: 10/09/2015 17:57   Ct Angio Chest Aorta W/cm &/or Wo/cm  10/09/2015  CLINICAL DATA:  Chest pain for 2 days EXAM: CT ANGIOGRAPHY CHEST WITH CONTRAST TECHNIQUE: Initially, axial CT images were obtained through the chest without intravenous contrast material administration. Multidetector CT imaging of the chest was performed using the standard protocol during bolus administration of intravenous contrast. Multiplanar CT image reconstructions and MIPs were obtained to evaluate the vascular anatomy. CONTRAST:  100 mL Isovue 370 nonionic COMPARISON:  Chest CT August 04, 2015; chest radiograph October 09, 2015 FINDINGS: Mediastinum/Lymph Nodes: There is no  thoracic aortic aneurysm or dissection. Visualized great vessels appear normal. There is no demonstrable pulmonary embolus. There are scattered atherosclerotic calcifications in the aorta, primarily near the arch. There are multiple foci of coronary artery calcification. Pericardium is not thickened. Visualized thyroid appears unremarkable.  There is no appreciable thoracic adenopathy. Lungs/Pleura: There are scattered foci of lower lobe atelectatic change bilaterally. There is a small area of tree-in-bud type appearance in the anterior segment of the right upper lobe, felt to represent localized pneumonia. Upper abdomen: In the visualized upper abdomen, there is hepatic steatosis. Visualized upper abdominal structures otherwise appear unremarkable except for mild atherosclerotic change in the aorta. Musculoskeletal: There are no blastic or lytic bone lesions. Review of the MIP images confirms the above findings. IMPRESSION: No demonstrable thoracic aortic aneurysm or dissection. No pulmonary embolus. Small area of tree-in-bud type appearance in the right upper lobe anteriorly, best seen on axial slices 13, 14, 15, and 16, series 3, felt to represent a small focus of pneumonia. There is patchy atelectasis in both lower lobes. Lungs elsewhere clear. No adenopathy. Hepatic steatosis. Electronically Signed   By: Lowella Grip III M.D.   On: 10/09/2015 20:13     Management plans discussed with the patient, family and they are in agreement.  CODE STATUS:     Code Status Orders        Start     Ordered   10/10/15 0005  Full code   Continuous     10/10/15 0004    Code Status History    Date Active Date Inactive Code Status Order ID Comments User Context   08/02/2015  4:30 AM 08/03/2015  5:07 PM Full Code [id number]  Saundra Shelling, MD Inpatient      TOTAL TIME TAKING CARE OF THIS PATIENT:59minutes.    Aidenn Skellenger M.D on 10/10/2015 at 9:24 AM  Between 7am to 6pm - Pager - [phone] After 6pm go to  [email or web address] - password [email or web address] [facility]  [facility]  Office  [phone]  CC: Primary care physician; No PCP Per Patient

## 2015-10-10 NOTE — Telephone Encounter (Signed)
-----   Message from Blain Pais sent at 10/10/2015  9:53 AM EDT ----- Regarding: tcm/ph 5/4 10:30 Ignacia Bayley, NP

## 2015-10-10 NOTE — Telephone Encounter (Signed)
No longer a working number and no other number listed.

## 2015-10-10 NOTE — Progress Notes (Signed)
Patient's boyfriend has now been contacted and he is on the way. Tele and IV's removed.

## 2015-10-10 NOTE — Progress Notes (Signed)
Pt admitted to room 237. A&Ox4, VSS, c/o chest pressure 8/10. Pt educated on telephone, call bell, and safety. Pt refuses bed alarm but agrees to contact nursing before up out of bed if feeling weak or dizzy. Skin assessed and telemetry verified with Kristine Garbe, RN. RN will continue to monitor and treat per MD orders. Rachael Fee, RN

## 2015-10-26 ENCOUNTER — Encounter: Payer: Self-pay | Admitting: *Deleted

## 2015-10-26 ENCOUNTER — Encounter: Payer: Medicaid Other | Admitting: Nurse Practitioner

## 2015-11-01 ENCOUNTER — Emergency Department: Payer: Medicaid Other

## 2015-11-01 ENCOUNTER — Emergency Department
Admission: EM | Admit: 2015-11-01 | Discharge: 2015-11-01 | Disposition: A | Discharge status: Home / Self Care | Payer: Medicaid Other | Attending: Emergency Medicine | Admitting: Emergency Medicine

## 2015-11-01 ENCOUNTER — Encounter: Payer: Self-pay | Admitting: *Deleted

## 2015-11-01 DIAGNOSIS — Z7951 Long term (current) use of inhaled steroids: Secondary | ICD-10-CM | POA: Insufficient documentation

## 2015-11-01 DIAGNOSIS — J449 Chronic obstructive pulmonary disease, unspecified: Secondary | ICD-10-CM | POA: Insufficient documentation

## 2015-11-01 DIAGNOSIS — F17210 Nicotine dependence, cigarettes, uncomplicated: Secondary | ICD-10-CM | POA: Diagnosis not present

## 2015-11-01 DIAGNOSIS — Z79891 Long term (current) use of opiate analgesic: Secondary | ICD-10-CM | POA: Insufficient documentation

## 2015-11-01 DIAGNOSIS — I2510 Atherosclerotic heart disease of native coronary artery without angina pectoris: Secondary | ICD-10-CM | POA: Insufficient documentation

## 2015-11-01 DIAGNOSIS — Z792 Long term (current) use of antibiotics: Secondary | ICD-10-CM | POA: Insufficient documentation

## 2015-11-01 DIAGNOSIS — B373 Candidiasis of vulva and vagina: Secondary | ICD-10-CM | POA: Diagnosis not present

## 2015-11-01 DIAGNOSIS — R109 Unspecified abdominal pain: Secondary | ICD-10-CM

## 2015-11-01 DIAGNOSIS — R1030 Lower abdominal pain, unspecified: Secondary | ICD-10-CM | POA: Diagnosis present

## 2015-11-01 DIAGNOSIS — Z7982 Long term (current) use of aspirin: Secondary | ICD-10-CM | POA: Insufficient documentation

## 2015-11-01 DIAGNOSIS — N390 Urinary tract infection, site not specified: Secondary | ICD-10-CM | POA: Insufficient documentation

## 2015-11-01 DIAGNOSIS — I10 Essential (primary) hypertension: Secondary | ICD-10-CM | POA: Insufficient documentation

## 2015-11-01 DIAGNOSIS — Z79899 Other long term (current) drug therapy: Secondary | ICD-10-CM | POA: Diagnosis not present

## 2015-11-01 DIAGNOSIS — I252 Old myocardial infarction: Secondary | ICD-10-CM | POA: Diagnosis not present

## 2015-11-01 DIAGNOSIS — E785 Hyperlipidemia, unspecified: Secondary | ICD-10-CM | POA: Insufficient documentation

## 2015-11-01 DIAGNOSIS — R102 Pelvic and perineal pain: Secondary | ICD-10-CM

## 2015-11-01 LAB — CBC
HCT: 44.4 % (ref 35.0–47.0)
Hemoglobin: 14.5 g/dL (ref 12.0–16.0)
MCH: 27 pg (ref 26.0–34.0)
MCHC: 32.7 g/dL (ref 32.0–36.0)
MCV: 82.4 fL (ref 80.0–100.0)
Platelets: 263 10*3/uL (ref 150–440)
RBC: 5.39 MIL/uL — ABNORMAL HIGH (ref 3.80–5.20)
RDW: 16.9 % — ABNORMAL HIGH (ref 11.5–14.5)
WBC: 10.9 10*3/uL (ref 3.6–11.0)

## 2015-11-01 LAB — URINALYSIS COMPLETE WITH MICROSCOPIC (ARMC ONLY)
Bilirubin Urine: NEGATIVE
Glucose, UA: NEGATIVE mg/dL
Hgb urine dipstick: NEGATIVE
Ketones, ur: NEGATIVE mg/dL
Nitrite: NEGATIVE
Protein, ur: NEGATIVE mg/dL
Specific Gravity, Urine: 1.003 — ABNORMAL LOW (ref 1.005–1.030)
pH: 6 (ref 5.0–8.0)

## 2015-11-01 LAB — COMPREHENSIVE METABOLIC PANEL
ALT: 66 U/L — ABNORMAL HIGH (ref 14–54)
AST: 54 U/L — ABNORMAL HIGH (ref 15–41)
Albumin: 4.3 g/dL (ref 3.5–5.0)
Alkaline Phosphatase: 82 U/L (ref 38–126)
Anion gap: 7 (ref 5–15)
BUN: 8 mg/dL (ref 6–20)
CO2: 23 mmol/L (ref 22–32)
Calcium: 9.7 mg/dL (ref 8.9–10.3)
Chloride: 107 mmol/L (ref 101–111)
Creatinine, Ser: 0.94 mg/dL (ref 0.44–1.00)
GFR calc Af Amer: >60 mL/min (ref >60)
GFR calc non Af Amer: >60 mL/min (ref >60)
Glucose, Bld: 86 mg/dL (ref 65–99)
Potassium: 4.7 mmol/L (ref 3.5–5.1)
Sodium: 137 mmol/L (ref 135–145)
Total Bilirubin: 0.6 mg/dL (ref 0.3–1.2)
Total Protein: 7.8 g/dL (ref 6.5–8.1)

## 2015-11-01 LAB — WET PREP, GENITAL
Sperm: NONE SEEN
Trich, Wet Prep: NONE SEEN

## 2015-11-01 LAB — CHLAMYDIA/NGC RT PCR (ARMC ONLY)
Chlamydia Tr: NOT DETECTED
N gonorrhoeae: NOT DETECTED

## 2015-11-01 LAB — POCT PREGNANCY, URINE: Preg Test, Ur: NEGATIVE

## 2015-11-01 MED ORDER — OXYCODONE-ACETAMINOPHEN 5-325 MG PO TABS
ORAL_TABLET | ORAL | Status: AC
  Administered 2015-11-01: 2 via ORAL
  Filled 2015-11-01: qty 2

## 2015-11-01 MED ORDER — OXYCODONE-ACETAMINOPHEN 5-325 MG PO TABS: 2.0000 | ORAL_TABLET | Freq: Four times a day (QID) | ORAL | Status: DC | PRN

## 2015-11-01 MED ORDER — OXYCODONE-ACETAMINOPHEN 5-325 MG PO TABS
2.0000 | ORAL_TABLET | Freq: Once | ORAL | Status: AC
  Administered 2015-11-01: 2 via ORAL

## 2015-11-01 MED ORDER — SULFAMETHOXAZOLE-TRIMETHOPRIM 800-160 MG PO TABS: 1.0000 | ORAL_TABLET | Freq: Two times a day (BID) | ORAL | Status: DC

## 2015-11-01 MED ORDER — METRONIDAZOLE 500 MG PO TABS: 500.0000 mg | ORAL_TABLET | Freq: Two times a day (BID) | ORAL | Status: DC

## 2015-11-01 MED ORDER — FLUCONAZOLE 150 MG PO TABS: 150.0000 mg | ORAL_TABLET | Freq: Every day | ORAL | Status: DC

## 2015-11-01 NOTE — Discharge Instructions (Signed)
Abdominal Pain, Adult Many things can cause abdominal pain. Usually, abdominal pain is not caused by a disease and will improve without treatment. It can often be observed and treated at home. Your health care provider will do a physical exam and possibly order blood tests and X-rays to help determine the seriousness of your pain. However, in many cases, more time must pass before a clear cause of the pain can be found. Before that point, your health care provider may not know if you need more testing or further treatment. HOME CARE INSTRUCTIONS Monitor your abdominal pain for any changes. The following actions may help to alleviate any discomfort you are experiencing:  Only take over-the-counter or prescription medicines as directed by your health care provider.  Do not take laxatives unless directed to do so by your health care provider.  Try a clear liquid diet (broth, tea, or water) as directed by your health care provider. Slowly move to a bland diet as tolerated. SEEK MEDICAL CARE IF:  You have unexplained abdominal pain.  You have abdominal pain associated with nausea or diarrhea.  You have pain when you urinate or have a bowel movement.  You experience abdominal pain that wakes you in the night.  You have abdominal pain that is worsened or improved by eating food.  You have abdominal pain that is worsened with eating fatty foods.  You have a fever. SEEK IMMEDIATE MEDICAL CARE IF:  Your pain does not go away within 2 hours.  You keep throwing up (vomiting).  Your pain is felt only in portions of the abdomen, such as the right side or the left lower portion of the abdomen.  You pass bloody or black tarry stools. MAKE SURE YOU:  Understand these instructions.  Will watch your condition.  Will get help right away if you are not doing well or get worse.   This information is not intended to replace advice given to you by your health care provider. Make sure you discuss  any questions you have with your health care provider.   Document Released: 03/20/2005 Document Revised: 03/01/2015 Document Reviewed: 02/17/2013 Elsevier Interactive Patient Education 2016 [employer] Inc[employer]  Pelvic Pain, Female Female pelvic pain can be caused by many different things and start from a variety of places. Pelvic pain refers to pain that is located in the lower half of the abdomen and between your hips. The pain may occur over a short period of time (acute) or may be reoccurring (chronic). The cause of pelvic pain may be related to disorders affecting the female reproductive organs (gynecologic), but it may also be related to the bladder, kidney stones, an intestinal complication, or muscle or skeletal problems. Getting help right away for pelvic pain is important, especially if there has been severe, sharp, or a sudden onset of unusual pain. It is also important to get help right away because some types of pelvic pain can be life threatening.  CAUSES  Below are only some of the causes of pelvic pain. The causes of pelvic pain can be in one of several categories.   Gynecologic.  Pelvic inflammatory disease.  Sexually transmitted infection.  Ovarian cyst or a twisted ovarian ligament (ovarian torsion).  Uterine lining that grows outside the uterus (endometriosis).  Fibroids, cysts, or tumors.  Ovulation.  Pregnancy.  Pregnancy that occurs outside the uterus (ectopic pregnancy).  Miscarriage.  Labor.  Abruption of the placenta or ruptured uterus.  Infection.  Uterine infection (endometritis).  Bladder infection.  Diverticulitis.  Miscarriage related to a uterine infection (septic abortion).  Bladder.  Inflammation of the bladder (cystitis).  Kidney stone(s).  Gastrointestinal.  Constipation.  Diverticulitis.  Neurologic.  Trauma.  Feeling pelvic pain because of mental or emotional causes (psychosomatic).  Cancers of the bowel or  pelvis. EVALUATION  Your caregiver will want to take a careful history of your concerns. This includes recent changes in your health, a careful gynecologic history of your periods (menses), and a sexual history. Obtaining your family history and medical history is also important. Your caregiver may suggest a pelvic exam. A pelvic exam will help identify the location and severity of the pain. It also helps in the evaluation of which organ system may be involved. In order to identify the cause of the pelvic pain and be properly treated, your caregiver may order tests. These tests may include:   A pregnancy test.  Pelvic ultrasonography.  An X-ray exam of the abdomen.  A urinalysis or evaluation of vaginal discharge.  Blood tests. HOME CARE INSTRUCTIONS   Only take over-the-counter or prescription medicines for pain, discomfort, or fever as directed by your caregiver.   Rest as directed by your caregiver.   Eat a balanced diet.   Drink enough fluids to make your urine clear or pale yellow, or as directed.   Avoid sexual intercourse if it causes pain.   Apply warm or cold compresses to the lower abdomen depending on which one helps the pain.   Avoid stressful situations.   Keep a journal of your pelvic pain. Write down when it started, where the pain is located, and if there are things that seem to be associated with the pain, such as food or your menstrual cycle.  Follow up with your caregiver as directed.  SEEK MEDICAL CARE IF:  Your medicine does not help your pain.  You have abnormal vaginal discharge. SEEK IMMEDIATE MEDICAL CARE IF:   You have heavy bleeding from the vagina.   Your pelvic pain increases.   You feel light-headed or faint.   You have chills.   You have pain with urination or blood in your urine.   You have uncontrolled diarrhea or vomiting.   You have a fever or persistent symptoms for more than 3 days.  You have a fever and your  symptoms suddenly get worse.   You are being physically or sexually abused.   This information is not intended to replace advice given to you by your health care provider. Make sure you discuss any questions you have with your health care provider.   Document Released: 05/07/2004 Document Revised: 03/01/2015 Document Reviewed: 09/30/2011 Elsevier Interactive Patient Education 2016 Elsevier Inc.  Urinary Tract Infection Urinary tract infections (UTIs) can develop anywhere along your urinary tract. Your urinary tract is your body's drainage system for removing wastes and extra water. Your urinary tract includes two kidneys, two ureters, a bladder, and a urethra. Your kidneys are a pair of bean-shaped organs. Each kidney is about the size of your fist. They are located below your ribs, one on each side of your spine. CAUSES Infections are caused by microbes, which are microscopic organisms, including fungi, viruses, and bacteria. These organisms are so small that they can only be seen through a microscope. Bacteria are the microbes that most commonly cause UTIs. SYMPTOMS  Symptoms of UTIs may vary by age and gender of the patient and by the location of the infection. Symptoms in young women typically include a frequent and intense urge  to urinate and a painful, burning feeling in the bladder or urethra during urination. Older women and men are more likely to be tired, shaky, and weak and have muscle aches and abdominal pain. A fever may mean the infection is in your kidneys. Other symptoms of a kidney infection include pain in your back or sides below the ribs, nausea, and vomiting. DIAGNOSIS To diagnose a UTI, your caregiver will ask you about your symptoms. Your caregiver will also ask you to provide a urine sample. The urine sample will be tested for bacteria and white blood cells. White blood cells are made by your body to help fight infection. TREATMENT  Typically, UTIs can be treated with  medication. Because most UTIs are caused by a bacterial infection, they usually can be treated with the use of antibiotics. The choice of antibiotic and length of treatment depend on your symptoms and the type of bacteria causing your infection. HOME CARE INSTRUCTIONS  If you were prescribed antibiotics, take them exactly as your caregiver instructs you. Finish the medication even if you feel better after you have only taken some of the medication.  Drink enough water and fluids to keep your urine clear or pale yellow.  Avoid caffeine, tea, and carbonated beverages. They tend to irritate your bladder.  Empty your bladder often. Avoid holding urine for long periods of time.  Empty your bladder before and after sexual intercourse.  After a bowel movement, women should cleanse from front to back. Use each tissue only once. SEEK MEDICAL CARE IF:   You have back pain.  You develop a fever.  Your symptoms do not begin to resolve within 3 days. SEEK IMMEDIATE MEDICAL CARE IF:   You have severe back pain or lower abdominal pain.  You develop chills.  You have nausea or vomiting.  You have continued burning or discomfort with urination. MAKE SURE YOU:   Understand these instructions.  Will watch your condition.  Will get help right away if you are not doing well or get worse.   This information is not intended to replace advice given to you by your health care provider. Make sure you discuss any questions you have with your health care provider.   Document Released: 03/20/2005 Document Revised: 03/01/2015 Document Reviewed: 07/19/2011 Elsevier Interactive Patient Education [employer].

## 2015-11-01 NOTE — ED Notes (Signed)
Pt reports she has low abd pain with dysuria.  Pt has low back pain with nausea.  No hx of kidney stones.   No vag bleeding.  No vag discharge.  Pt alert.

## 2015-11-01 NOTE — ED Notes (Signed)
poct pregnancy Negative 

## 2015-11-01 NOTE — ED Provider Notes (Signed)
[facility] Emergency Department Provider Note        Time seen: ----------------------------------------- 4:13 PM on 11/01/2015 -----------------------------------------    I have reviewed the triage vital signs and the nursing notes.   HISTORY  Chief Complaint Abdominal Pain and Back Pain    HPI Sara Munoz is a 50 y.o. female who presents ER for lower abdominal pain with dysuria. Patient has low back pain with nausea, no history of kidney stones. She denies any vaginal bleeding or discharge, states she's had ovarian cysts that been problematic for her before. Nothing makes her pain better or worse.   Past Medical History  Diagnosis Date  . Hypertension   . Coronary artery disease     a. Mild-non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/2014  . COPD (chronic obstructive pulmonary disease) ([place])   . Hypercholesteremia   . Chronic back pain   . MI (myocardial infarction) [facility])     Patient Active Problem List   Diagnosis Date Noted  . Essential hypertension   . Hypokalemia   . Hypertensive urgency 10/09/2015  . COPD (chronic obstructive pulmonary disease) ([place]) 10/09/2015  . GERD (gastroesophageal reflux disease) 10/09/2015  . Chest pain 08/02/2015  . Non-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/2014 08/02/2015  . Essential HTN 08/02/2015  . Hyperlipidemia 08/02/2015  . Chronic back pain greater than 3 months duration 08/02/2015  . Chest pain with low risk for cardiac etiology 08/02/2015  . Costochondritis 08/02/2015    Past Surgical History  Procedure Laterality Date  . Orif femur fracture    . Orif tibia & fibula fractures    . Hemorroids    . Ovary surgery      Allergies Flexeril; Keflex; Levofloxacin; Toradol; Tramadol; and Zoloft  Social History Social History  Substance Use Topics  . Smoking status: Current Every Day Smoker -- 1.00 packs/day    Types: Cigarettes  . Smokeless tobacco: Never Used  . Alcohol Use: 0.0 oz/week    0 Standard  drinks or equivalent per week     Comment: drinks occasionally    Review of Systems Constitutional: Negative for fever. Eyes: Negative for visual changes. ENT: Negative for sore throat. Cardiovascular: Negative for chest pain. Respiratory: Negative for shortness of breath. Gastrointestinal: Positive for abdominal pain Genitourinary: Positive for dysuria Musculoskeletal: Positive for back pain Skin: Negative for rash. Neurological: Negative for headaches, focal weakness or numbness.  10-point ROS otherwise negative.  ____________________________________________   PHYSICAL EXAM:  VITAL SIGNS: ED Triage Vitals  Enc Vitals Group     BP 11/01/15 1556 133/84 mmHg     Pulse Rate 11/01/15 1556 66     Resp 11/01/15 1556 18     Temp 11/01/15 1556 98.5 F (36.9 C)     Temp Source 11/01/15 1556 Oral     SpO2 11/01/15 1556 99 %     Weight 11/01/15 1556 180 lb (81.647 kg)     Height 11/01/15 1556 5\' 4"  (1.626 m)     Head Cir --      Peak Flow --      Pain Score 11/01/15 1558 10     Pain Loc --      Pain Edu? --      Excl. in [place]? --     Constitutional: Alert and oriented. Well appearing and in no distress. Eyes: Conjunctivae are normal. PERRL. Normal extraocular movements. ENT   Head: Normocephalic and atraumatic.   Nose: No congestion/rhinnorhea.   Mouth/Throat: Mucous membranes are moist.  Neck: No stridor. Cardiovascular: Normal rate, regular rhythm. No murmurs, rubs, or gallops. Respiratory: Normal respiratory effort without tachypnea nor retractions. Breath sounds are clear and equal bilaterally. No wheezes/rales/rhonchi. Gastrointestinal: Nonspecific lower abdominal tenderness, no rebound or guarding. Normal bowel sounds. Genitourinary: Cervical motion tenderness, white vaginal discharge. Cervical inflammation. Musculoskeletal: Nontender with normal range of motion in all extremities. No lower extremity tenderness nor edema. Neurologic:  Normal speech and  language. No gross focal neurologic deficits are appreciated.  Skin:  Skin is warm, dry and intact. No rash noted. Psychiatric: Mood and affect are normal. Speech and behavior are normal.  ____________________________________________  ED COURSE:  Pertinent labs & imaging results that were available during my care of the patient were reviewed by me and considered in my medical decision making (see chart for details). Patient is in no acute distress, will check basic labs and imaging. ____________________________________________    LABS (pertinent positives/negatives)  Labs Reviewed  WET PREP, GENITAL - Abnormal; Notable for the following:    Yeast Wet Prep HPF POC PRESENT (*)    Clue Cells Wet Prep HPF POC PRESENT (*)    WBC, Wet Prep HPF POC FEW (*)    All other components within normal limits  COMPREHENSIVE METABOLIC PANEL - Abnormal; Notable for the following:    AST 54 (*)    ALT 66 (*)    All other components within normal limits  CBC - Abnormal; Notable for the following:    RBC 5.39 (*)    RDW 16.9 (*)    All other components within normal limits  URINALYSIS COMPLETEWITH MICROSCOPIC ([facility] ONLY) - Abnormal; Notable for the following:    Color, Urine YELLOW (*)    APPearance HAZY (*)    Specific Gravity, Urine 1.003 (*)    Leukocytes, UA 2+ (*)    Bacteria, UA RARE (*)    Squamous Epithelial / LPF 6-30 (*)    All other components within normal limits  CHLAMYDIA/NGC RT PCR ([facility] ONLY)  POC URINE PREG, ED  POCT PREGNANCY, URINE    RADIOLOGY Images were viewed by me  KUB FINDINGS: The bowel gas pattern is normal. No radio-opaque calculi or other significant radiographic abnormality are seen.  IMPRESSION: Negative exam. IMPRESSION: 1. Essentially normal pelvic ultrasound. 2. Small leiomyoma within the uterus. 3. Endometrium measuring 13 mm is within normal limits for premenopausal female 4. No adnexal abnormality  identified. __________________ __________________________  FINAL ASSESSMENT AND PLAN  Abdominal pain, UTI, vaginal yeast  Plan: Patient with labs and imaging as dictated above. Patient is in no acute distress, no clear etiology for her significant symptoms. She will be discharged with antibiotics, short supply pain medicine and Diflucan. She is stable for outpatient follow-up with her primary care doctor.   Earleen Newport, MD   Note: This dictation was prepared with Dragon dictation. Any transcriptional errors that result from this process are unintentional   Earleen Newport, MD 11/01/15 1931

## 2015-11-08 ENCOUNTER — Encounter: Payer: Self-pay | Admitting: Emergency Medicine

## 2015-11-08 ENCOUNTER — Emergency Department: Payer: Medicaid Other

## 2015-11-08 ENCOUNTER — Emergency Department
Admission: EM | Admit: 2015-11-08 | Discharge: 2015-11-08 | Disposition: A | Discharge status: Home / Self Care | Payer: Medicaid Other | Attending: Student | Admitting: Student

## 2015-11-08 DIAGNOSIS — J449 Chronic obstructive pulmonary disease, unspecified: Secondary | ICD-10-CM | POA: Diagnosis not present

## 2015-11-08 DIAGNOSIS — Z79899 Other long term (current) drug therapy: Secondary | ICD-10-CM | POA: Diagnosis not present

## 2015-11-08 DIAGNOSIS — J069 Acute upper respiratory infection, unspecified: Secondary | ICD-10-CM | POA: Insufficient documentation

## 2015-11-08 DIAGNOSIS — Z79891 Long term (current) use of opiate analgesic: Secondary | ICD-10-CM | POA: Diagnosis not present

## 2015-11-08 DIAGNOSIS — Z7951 Long term (current) use of inhaled steroids: Secondary | ICD-10-CM | POA: Diagnosis not present

## 2015-11-08 DIAGNOSIS — Z7982 Long term (current) use of aspirin: Secondary | ICD-10-CM | POA: Insufficient documentation

## 2015-11-08 DIAGNOSIS — I252 Old myocardial infarction: Secondary | ICD-10-CM | POA: Diagnosis not present

## 2015-11-08 DIAGNOSIS — I2510 Atherosclerotic heart disease of native coronary artery without angina pectoris: Secondary | ICD-10-CM | POA: Insufficient documentation

## 2015-11-08 DIAGNOSIS — E785 Hyperlipidemia, unspecified: Secondary | ICD-10-CM | POA: Diagnosis not present

## 2015-11-08 DIAGNOSIS — F17210 Nicotine dependence, cigarettes, uncomplicated: Secondary | ICD-10-CM | POA: Diagnosis not present

## 2015-11-08 DIAGNOSIS — R05 Cough: Secondary | ICD-10-CM | POA: Diagnosis present

## 2015-11-08 LAB — POCT RAPID STREP A: Streptococcus, Group A Screen (Direct): NEGATIVE

## 2015-11-08 MED ORDER — AZITHROMYCIN 250 MG PO TABS: ORAL_TABLET | ORAL | Status: DC

## 2015-11-08 MED ORDER — GUAIFENESIN-CODEINE 100-10 MG/5ML PO SOLN: 10.0000 mL | ORAL | Status: DC | PRN

## 2015-11-08 NOTE — ED Provider Notes (Signed)
[facility] Emergency Department Provider Note  ____________________________________________  Time seen: Approximately 4:08 PM  I have reviewed the triage vital signs and the nursing notes.   HISTORY  Chief Complaint Nasal Congestion and Generalized Body Aches    HPI Sara Munoz is a 50 y.o. female who presents with a 2 to three-day history of cough congestion and body aches. Patient states she's had a sore throat for one week. Denies any fever chills nausea vomiting. No change in appetite. Other than it hurts to swallow.   Past Medical History  Diagnosis Date  . Hypertension   . Coronary artery disease     a. Mild-non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/2014  . COPD (chronic obstructive pulmonary disease) ([place])   . Hypercholesteremia   . Chronic back pain   . MI (myocardial infarction) [facility])     Patient Active Problem List   Diagnosis Date Noted  . Essential hypertension   . Hypokalemia   . Hypertensive urgency 10/09/2015  . COPD (chronic obstructive pulmonary disease) ([place]) 10/09/2015  . GERD (gastroesophageal reflux disease) 10/09/2015  . Chest pain 08/02/2015  . Non-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/2014 08/02/2015  . Essential HTN 08/02/2015  . Hyperlipidemia 08/02/2015  . Chronic back pain greater than 3 months duration 08/02/2015  . Chest pain with low risk for cardiac etiology 08/02/2015  . Costochondritis 08/02/2015    Past Surgical History  Procedure Laterality Date  . Orif femur fracture    . Orif tibia & fibula fractures    . Hemorroids    . Ovary surgery      Current Outpatient Rx  Name  Route  Sig  Dispense  Refill  . albuterol (PROVENTIL HFA;VENTOLIN HFA) 108 (90 Base) MCG/ACT inhaler   Inhalation   Inhale 2 puffs into the lungs every 4 (four) hours as needed for wheezing or shortness of breath.   1 Inhaler   0   . ALPRAZolam (XANAX) 0.5 MG tablet   Oral   Take 0.5 mg by mouth 3 (three) times daily as needed for anxiety.          Marland Kitchen aspirin EC 81 MG tablet   Oral   Take 81 mg by mouth daily.         Marland Kitchen atenolol (TENORMIN) 50 MG tablet   Oral   Take 50 mg by mouth daily.         Marland Kitchen atorvastatin (LIPITOR) 20 MG tablet   Oral   Take 20 mg by mouth at bedtime.         Marland Kitchen azithromycin (ZITHROMAX Z-PAK) 250 MG tablet      Take as  directed   6 each   0   . budesonide-formoterol (SYMBICORT) 160-4.5 MCG/ACT inhaler   Inhalation   Inhale 2 puffs into the lungs 2 (two) times daily.   1 Inhaler   0   . clonazePAM (KLONOPIN) 1 MG tablet   Oral   Take 1 tablet by mouth 2 (two) times daily.       0   . dexlansoprazole (DEXILANT) 60 MG capsule   Oral   Take 60 mg by mouth daily.         . diazepam (VALIUM) 2 MG tablet   Oral   Take 1 tablet (2 mg total) by mouth every 8 (eight) hours as needed for anxiety.   15 tablet   0   . docusate sodium (COLACE) 100 MG capsule   Oral   Take  200 mg by mouth 2 (two) times daily as needed for mild constipation.         . fluconazole (DIFLUCAN) 150 MG tablet   Oral   Take 1 tablet (150 mg total) by mouth daily.   1 tablet   1   . gabapentin (NEURONTIN) 300 MG capsule   Oral   Take 2 capsules (600 mg total) by mouth 3 (three) times daily.   180 capsule   0   . guaiFENesin (MUCINEX) 600 MG 12 hr tablet   Oral   Take 1 tablet (600 mg total) by mouth 2 (two) times daily.   15 tablet   0   . linaclotide (LINZESS) 290 MCG CAPS capsule   Oral   Take 290 mcg by mouth daily before breakfast.         . lisinopril (PRINIVIL,ZESTRIL) 20 MG tablet   Oral   Take 20 mg by mouth daily.         . magnesium oxide (MAG-OX) 400 (241.3 Mg) MG tablet   Oral   Take 400 mg by mouth daily.         . metoprolol tartrate (LOPRESSOR) 25 MG tablet   Oral   Take 1 tablet (25 mg total) by mouth 2 (two) times daily.   60 tablet   2   . metroNIDAZOLE (FLAGYL) 500 MG tablet   Oral   Take 1 tablet (500 mg total) by mouth 2 (two) times daily.   14  tablet   0   . morphine (MS CONTIN) 30 MG 12 hr tablet   Oral   Take 30 mg by mouth 3 (three) times daily.         . nicotine (NICODERM CQ - DOSED IN MG/24 HOURS) 14 mg/24hr patch   Transdermal   Place 1 patch (14 mg total) onto the skin daily.   30 patch   0   . nitroGLYCERIN (NITROSTAT) 0.4 MG SL tablet   Sublingual   Place 1 tablet (0.4 mg total) under the tongue every 5 (five) minutes x 3 doses as needed for chest pain.   30 tablet   2   . ondansetron (ZOFRAN) 8 MG tablet   Oral   Take 8 mg by mouth every 8 (eight) hours as needed for nausea or vomiting.         . Oxycodone HCl 10 MG TABS   Oral   Take 10 mg by mouth 4 (four) times daily.         Marland Kitchen oxyCODONE-acetaminophen (PERCOCET) 5-325 MG tablet   Oral   Take 2 tablets by mouth every 6 (six) hours as needed for moderate pain or severe pain.   12 tablet   0   . potassium chloride SA (K-DUR,KLOR-CON) 20 MEQ tablet   Oral   Take 20 mEq by mouth 2 (two) times daily.         Marland Kitchen RANEXA 500 MG 12 hr tablet   Oral   Take 1 tablet (500 mg total) by mouth 2 (two) times daily.   60 tablet   2     Dispense as written.   . sulfamethoxazole-trimethoprim (BACTRIM DS) 800-160 MG tablet   Oral   Take 1 tablet by mouth 2 (two) times daily.   6 tablet   0   . tiZANidine (ZANAFLEX) 4 MG tablet   Oral   Take 4 mg by mouth every 8 (eight) hours as needed for muscle spasms.  Allergies Flexeril; Keflex; Levofloxacin; Toradol; Tramadol; and Zoloft  Family History  Problem Relation Age of Onset  . Heart disease Mother   . Lung cancer Mother   . Ovarian cancer Mother     Social History Social History  Substance Use Topics  . Smoking status: Current Every Day Smoker -- 1.00 packs/day    Types: Cigarettes  . Smokeless tobacco: Never Used  . Alcohol Use: 0.0 oz/week    0 Standard drinks or equivalent per week     Comment: drinks occasionally    Review of Systems Constitutional: No  fever/chills Eyes: No visual changes. ENT: Positive sore throat. Cardiovascular: Occasional chest pain, feels like tightness.Marland Kitchen Respiratory: Denies shortness of breath. Positive for coughing. Musculoskeletal: Negative for back pain. Skin: Negative for rash. Neurological: Negative for headaches, focal weakness or numbness.  10-point ROS otherwise negative.  ____________________________________________   PHYSICAL EXAM:  VITAL SIGNS: ED Triage Vitals  Enc Vitals Group     BP 11/08/15 1536 127/93 mmHg     Pulse Rate 11/08/15 1536 70     Resp 11/08/15 1536 18     Temp 11/08/15 1536 98.2 F (36.8 C)     Temp Source 11/08/15 1536 Oral     SpO2 11/08/15 1536 100 %     Weight 11/08/15 1536 180 lb (81.647 kg)     Height 11/08/15 1536 5\' 4"  (1.626 m)     Head Cir --      Peak Flow --      Pain Score 11/08/15 1537 9     Pain Loc --      Pain Edu? --      Excl. in [place]? --     Constitutional: Alert and oriented. Well appearing and in no acute distress. Nose: Negative congestion/rhinnorhea. Mouth/Throat: Mucous membranes are moist.  Oropharynx non-erythematous. Neck: No stridor. Full range of motion nontender   Cardiovascular: Normal rate, regular rhythm. Grossly normal heart sounds.  Good peripheral circulation. Respiratory: Normal respiratory effort.  No retractions. Lungs with scattered coarse rhonchi noted. Musculoskeletal: No lower extremity tenderness nor edema.  No joint effusions. Neurologic:  Normal speech and language. No gross focal neurologic deficits are appreciated. No gait instability. Skin:  Skin is warm, dry and intact. No rash noted. Psychiatric: Mood and affect are normal. Speech and behavior are normal.  ____________________________________________   LABS (all labs ordered are listed, but only abnormal results are displayed)  Labs Reviewed - No data to display ____________________________________________  EKG  No  STEMI. ____________________________________________  RADIOLOGY   Negative for any acute cardiopulmonary findings.  ____________________________________________   PROCEDURES  Procedure(s) performed: None  Critical Care performed: No  ____________________________________________   INITIAL IMPRESSION / ASSESSMENT AND PLAN / ED COURSE  Pertinent labs & imaging results that were available during my care of the patient were reviewed by me and considered in my medical decision making (see chart for details).  Acute URI with pharyngitis. Rx given for Z-Pak, Robitussin-AC. Patient follow-up PCP or return to the ER with any worsening symptomology. ____________________________________________   FINAL CLINICAL IMPRESSION(S) / ED DIAGNOSES  Final diagnoses:  None     This chart was dictated using voice recognition software/Dragon. Despite best efforts to proofread, errors can occur which can change the meaning. Any change was purely unintentional.   [name], [name] 11/08/15 [place]ia Gayle, MD 11/08/15 [phone]

## 2015-11-08 NOTE — ED Notes (Signed)
General body aches, nasal/chest congestion, sore throat x1 week

## 2015-11-08 NOTE — ED Notes (Signed)
See triage note  States she has had nasal and chest congestion with cough for several days   Afebrile on arrival

## 2015-11-08 NOTE — Discharge Instructions (Signed)
Upper Respiratory Infection, Adult Most upper respiratory infections (URIs) are a viral infection of the air passages leading to the lungs. A URI affects the nose, throat, and upper air passages. The most common type of URI is nasopharyngitis and is typically referred to as "the common cold." URIs run their course and usually go away on their own. Most of the time, a URI does not require medical attention, but sometimes a bacterial infection in the upper airways can follow a viral infection. This is called a secondary infection. Sinus and middle ear infections are common types of secondary upper respiratory infections. Bacterial pneumonia can also complicate a URI. A URI can worsen asthma and chronic obstructive pulmonary disease (COPD). Sometimes, these complications can require emergency medical care and may be life threatening.  CAUSES Almost all URIs are caused by viruses. A virus is a type of germ and can spread from one person to another.  RISKS FACTORS You may be at risk for a URI if:   You smoke.   You have chronic heart or lung disease.  You have a weakened defense (immune) system.   You are very young or very old.   You have nasal allergies or asthma.  You work in crowded or poorly ventilated areas.  You work in health care facilities or schools. SIGNS AND SYMPTOMS  Symptoms typically develop 2-3 days after you come in contact with a cold virus. Most viral URIs last 7-10 days. However, viral URIs from the influenza virus (flu virus) can last 14-18 days and are typically more severe. Symptoms may include:   Runny or stuffy (congested) nose.   Sneezing.   Cough.   Sore throat.   Headache.   Fatigue.   Fever.   Loss of appetite.   Pain in your forehead, behind your eyes, and over your cheekbones (sinus pain).  Muscle aches.  DIAGNOSIS  Your health care provider may diagnose a URI by:  Physical exam.  Tests to check that your symptoms are not due to  another condition such as:  Strep throat.  Sinusitis.  Pneumonia.  Asthma. TREATMENT  A URI goes away on its own with time. It cannot be cured with medicines, but medicines may be prescribed or recommended to relieve symptoms. Medicines may help:  Reduce your fever.  Reduce your cough.  Relieve nasal congestion. HOME CARE INSTRUCTIONS   Take medicines only as directed by your health care provider.   Gargle warm saltwater or take cough drops to comfort your throat as directed by your health care provider.  Use a warm mist humidifier or inhale steam from a shower to increase air moisture. This may make it easier to breathe.  Drink enough fluid to keep your urine clear or pale yellow.   Eat soups and other clear broths and maintain good nutrition.   Rest as needed.   Return to work when your temperature has returned to normal or as your health care provider advises. You may need to stay home longer to avoid infecting others. You can also use a face mask and careful hand washing to prevent spread of the virus.  Increase the usage of your inhaler if you have asthma.   Do not use any tobacco products, including cigarettes, chewing tobacco, or electronic cigarettes. If you need help quitting, ask your health care provider. PREVENTION  The best way to protect yourself from getting a cold is to practice good hygiene.   Avoid oral or hand contact with people with cold   symptoms.   Wash your hands often if contact occurs.  There is no clear evidence that vitamin C, vitamin E, echinacea, or exercise reduces the chance of developing a cold. However, it is always recommended to get plenty of rest, exercise, and practice good nutrition.  SEEK MEDICAL CARE IF:   You are getting worse rather than better.   Your symptoms are not controlled by medicine.   You have chills.  You have worsening shortness of breath.  You have brown or red mucus.  You have yellow or brown nasal  discharge.  You have pain in your face, especially when you bend forward.  You have a fever.  You have swollen neck glands.  You have pain while swallowing.  You have white areas in the back of your throat. SEEK IMMEDIATE MEDICAL CARE IF:   You have severe or persistent:  Headache.  Ear pain.  Sinus pain.  Chest pain.  You have chronic lung disease and any of the following:  Wheezing.  Prolonged cough.  Coughing up blood.  A change in your usual mucus.  You have a stiff neck.  You have changes in your:  Vision.  Hearing.  Thinking.  Mood. MAKE SURE YOU:   Understand these instructions.  Will watch your condition.  Will get help right away if you are not doing well or get worse.   This information is not intended to replace advice given to you by your health care provider. Make sure you discuss any questions you have with your health care provider.   Document Released: 12/04/2000 Document Revised: 10/25/2014 Document Reviewed: 09/15/2013 Elsevier Interactive Patient Education 2016 [employer]  

## 2015-12-07 ENCOUNTER — Other Ambulatory Visit: Payer: Self-pay | Admitting: Physician Assistant

## 2015-12-07 DIAGNOSIS — Z1231 Encounter for screening mammogram for malignant neoplasm of breast: Secondary | ICD-10-CM

## 2016-01-05 ENCOUNTER — Encounter: Payer: Self-pay | Admitting: Emergency Medicine

## 2016-01-05 ENCOUNTER — Emergency Department
Admission: EM | Admit: 2016-01-05 | Discharge: 2016-01-05 | Disposition: A | Discharge status: Home / Self Care | Payer: Medicaid Other | Attending: Emergency Medicine | Admitting: Emergency Medicine

## 2016-01-05 ENCOUNTER — Emergency Department: Payer: Medicaid Other

## 2016-01-05 ENCOUNTER — Ambulatory Visit: Payer: Medicaid Other | Attending: Physician Assistant

## 2016-01-05 DIAGNOSIS — S161XXA Strain of muscle, fascia and tendon at neck level, initial encounter: Secondary | ICD-10-CM | POA: Diagnosis not present

## 2016-01-05 DIAGNOSIS — M545 Low back pain: Secondary | ICD-10-CM | POA: Diagnosis present

## 2016-01-05 DIAGNOSIS — S300XXA Contusion of lower back and pelvis, initial encounter: Secondary | ICD-10-CM | POA: Diagnosis not present

## 2016-01-05 DIAGNOSIS — Z79899 Other long term (current) drug therapy: Secondary | ICD-10-CM | POA: Diagnosis not present

## 2016-01-05 DIAGNOSIS — I252 Old myocardial infarction: Secondary | ICD-10-CM | POA: Diagnosis not present

## 2016-01-05 DIAGNOSIS — E785 Hyperlipidemia, unspecified: Secondary | ICD-10-CM | POA: Diagnosis not present

## 2016-01-05 DIAGNOSIS — Y9289 Other specified places as the place of occurrence of the external cause: Secondary | ICD-10-CM | POA: Insufficient documentation

## 2016-01-05 DIAGNOSIS — S8011XA Contusion of right lower leg, initial encounter: Secondary | ICD-10-CM | POA: Insufficient documentation

## 2016-01-05 DIAGNOSIS — I10 Essential (primary) hypertension: Secondary | ICD-10-CM | POA: Insufficient documentation

## 2016-01-05 DIAGNOSIS — J449 Chronic obstructive pulmonary disease, unspecified: Secondary | ICD-10-CM | POA: Insufficient documentation

## 2016-01-05 DIAGNOSIS — Y939 Activity, unspecified: Secondary | ICD-10-CM | POA: Insufficient documentation

## 2016-01-05 DIAGNOSIS — Z7982 Long term (current) use of aspirin: Secondary | ICD-10-CM | POA: Insufficient documentation

## 2016-01-05 DIAGNOSIS — I2510 Atherosclerotic heart disease of native coronary artery without angina pectoris: Secondary | ICD-10-CM | POA: Diagnosis not present

## 2016-01-05 DIAGNOSIS — F17210 Nicotine dependence, cigarettes, uncomplicated: Secondary | ICD-10-CM | POA: Diagnosis not present

## 2016-01-05 DIAGNOSIS — Y999 Unspecified external cause status: Secondary | ICD-10-CM | POA: Insufficient documentation

## 2016-01-05 DIAGNOSIS — W1789XA Other fall from one level to another, initial encounter: Secondary | ICD-10-CM | POA: Insufficient documentation

## 2016-01-05 LAB — POCT PREGNANCY, URINE: Preg Test, Ur: NEGATIVE

## 2016-01-05 MED ORDER — HYDROCODONE-ACETAMINOPHEN 5-325 MG PO TABS
1.0000 | ORAL_TABLET | Freq: Once | ORAL | Status: AC
  Administered 2016-01-05: 1 via ORAL
  Filled 2016-01-05: qty 1

## 2016-01-05 MED ORDER — HYDROCODONE-ACETAMINOPHEN 5-325 MG PO TABS: 1.0000 | ORAL_TABLET | ORAL | Status: DC | PRN

## 2016-01-05 NOTE — Discharge Instructions (Signed)
Ice or heat to your muscles as needed. Norco as needed for severe pain. Take ibuprofen at home for inflammation. Call your doctor on Monday for further instructions.

## 2016-01-05 NOTE — ED Provider Notes (Signed)
[facility] Emergency Department Provider Note  ____________________________________________  Time seen: Approximately 2:03 PM  I have reviewed the triage vital signs and the nursing notes.   HISTORY  Chief Complaint Fall; Back Pain; and Neck Pain   HPI Sara Munoz is a 50 y.o. female is here with complaint of neck pain, back pain and right lower leg pain. Patient states that she fell through her back 3 days ago in an area that was weak. Patient states she was approximately 3 feet from the ground when she fell through. She states that she did not go completely through however it "jarred her neck". She denies any head injury or loss of consciousness during this event. Patient is not taking any over-the-counter medication for her pain. Patient states she has chronic pain and was seeing a "pain doctor" before she moved here. Patient states that she is seeing Dr.Sowles who is making plans to refer her to a pain clinic in this area. Patient has continued to be ambulatory since this event. She denies any nausea, vomiting, visual changes, headache, paresthesias or incontinence of bowel or bladder. She rates her pain as a 9/10 at this time.   Past Medical History  Diagnosis Date  . Hypertension   . Coronary artery disease     a. Mild-non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/2014  . COPD (chronic obstructive pulmonary disease) ([place])   . Hypercholesteremia   . Chronic back pain   . MI (myocardial infarction) [facility])     Patient Active Problem List   Diagnosis Date Noted  . Essential hypertension   . Hypokalemia   . Hypertensive urgency 10/09/2015  . COPD (chronic obstructive pulmonary disease) ([place]) 10/09/2015  . GERD (gastroesophageal reflux disease) 10/09/2015  . Chest pain 08/02/2015  . Non-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/2014 08/02/2015  . Essential HTN 08/02/2015  . Hyperlipidemia 08/02/2015  . Chronic back pain greater than 3 months duration 08/02/2015  .  Chest pain with low risk for cardiac etiology 08/02/2015  . Costochondritis 08/02/2015    Past Surgical History  Procedure Laterality Date  . Orif femur fracture    . Orif tibia & fibula fractures    . Hemorroids    . Ovary surgery      Current Outpatient Rx  Name  Route  Sig  Dispense  Refill  . albuterol (PROVENTIL HFA;VENTOLIN HFA) 108 (90 Base) MCG/ACT inhaler   Inhalation   Inhale 2 puffs into the lungs every 4 (four) hours as needed for wheezing or shortness of breath.   1 Inhaler   0   . ALPRAZolam (XANAX) 0.5 MG tablet   Oral   Take 0.5 mg by mouth 3 (three) times daily as needed for anxiety.         Marland Kitchen aspirin EC 81 MG tablet   Oral   Take 81 mg by mouth daily.         Marland Kitchen atenolol (TENORMIN) 50 MG tablet   Oral   Take 50 mg by mouth daily.         Marland Kitchen atorvastatin (LIPITOR) 20 MG tablet   Oral   Take 20 mg by mouth at bedtime.         Marland Kitchen azithromycin (ZITHROMAX Z-PAK) 250 MG tablet      Take 2 tablets (500 mg) on  Day 1,  followed by 1 tablet (250 mg) once daily on Days 2 through 5.   6 each   0   . budesonide-formoterol (  SYMBICORT) 160-4.5 MCG/ACT inhaler   Inhalation   Inhale 2 puffs into the lungs 2 (two) times daily.   1 Inhaler   0   . clonazePAM (KLONOPIN) 1 MG tablet   Oral   Take 1 tablet by mouth 2 (two) times daily.       0   . dexlansoprazole (DEXILANT) 60 MG capsule   Oral   Take 60 mg by mouth daily.         . diazepam (VALIUM) 2 MG tablet   Oral   Take 1 tablet (2 mg total) by mouth every 8 (eight) hours as needed for anxiety.   15 tablet   0   . docusate sodium (COLACE) 100 MG capsule   Oral   Take 200 mg by mouth 2 (two) times daily as needed for mild constipation.         . gabapentin (NEURONTIN) 300 MG capsule   Oral   Take 2 capsules (600 mg total) by mouth 3 (three) times daily.   180 capsule   0   . HYDROcodone-acetaminophen (NORCO/VICODIN) 5-325 MG tablet   Oral   Take 1 tablet by mouth every 4 (four)  hours as needed for moderate pain.   15 tablet   0   . linaclotide (LINZESS) 290 MCG CAPS capsule   Oral   Take 290 mcg by mouth daily before breakfast.         . lisinopril (PRINIVIL,ZESTRIL) 20 MG tablet   Oral   Take 20 mg by mouth daily.         . magnesium oxide (MAG-OX) 400 (241.3 Mg) MG tablet   Oral   Take 400 mg by mouth daily.         . metoprolol tartrate (LOPRESSOR) 25 MG tablet   Oral   Take 1 tablet (25 mg total) by mouth 2 (two) times daily.   60 tablet   2   . morphine (MS CONTIN) 30 MG 12 hr tablet   Oral   Take 30 mg by mouth 3 (three) times daily.         . nicotine (NICODERM CQ - DOSED IN MG/24 HOURS) 14 mg/24hr patch   Transdermal   Place 1 patch (14 mg total) onto the skin daily.   30 patch   0   . nitroGLYCERIN (NITROSTAT) 0.4 MG SL tablet   Sublingual   Place 1 tablet (0.4 mg total) under the tongue every 5 (five) minutes x 3 doses as needed for chest pain.   30 tablet   2   . ondansetron (ZOFRAN) 8 MG tablet   Oral   Take 8 mg by mouth every 8 (eight) hours as needed for nausea or vomiting.         . Oxycodone HCl 10 MG TABS   Oral   Take 10 mg by mouth 4 (four) times daily.         . potassium chloride SA (K-DUR,KLOR-CON) 20 MEQ tablet   Oral   Take 20 mEq by mouth 2 (two) times daily.         Marland Kitchen RANEXA 500 MG 12 hr tablet   Oral   Take 1 tablet (500 mg total) by mouth 2 (two) times daily.   60 tablet   2     Dispense as written.   Marland Kitchen tiZANidine (ZANAFLEX) 4 MG tablet   Oral   Take 4 mg by mouth every 8 (eight) hours as needed for muscle spasms.  Allergies Flexeril; Keflex; Levofloxacin; Toradol; Tramadol; and Zoloft  Family History  Problem Relation Age of Onset  . Heart disease Mother   . Lung cancer Mother   . Ovarian cancer Mother     Social History Social History  Substance Use Topics  . Smoking status: Current Every Day Smoker -- 1.00 packs/day    Types: Cigarettes  . Smokeless  tobacco: Never Used  . Alcohol Use: 0.0 oz/week    0 Standard drinks or equivalent per week     Comment: drinks occasionally    Review of Systems Constitutional: No fever/chills Eyes: No visual changes. ENT: No trauma Cardiovascular: Denies chest pain. Respiratory: Denies shortness of breath. Gastrointestinal:   No nausea, no vomiting.  Genitourinary: Negative for dysuria. Musculoskeletal: Positive for back pain. Positive for neck pain. Positive for acute and chronic right lower leg pain. Skin: Negative for rash. Neurological: Negative for headaches, focal weakness or numbness.  10-point ROS otherwise negative.  ____________________________________________   PHYSICAL EXAM:  VITAL SIGNS: ED Triage Vitals  Enc Vitals Group     BP --      Pulse --      Resp --      Temp --      Temp src --      SpO2 --      Weight --      Height --      Head Cir --      Peak Flow --      Pain Score --      Pain Loc --      Pain Edu? --      Excl. in [place]? --     Constitutional: Alert and oriented. Well appearing and in no acute distress. Eyes: Conjunctivae are normal. PERRL. EOMI. Head: Atraumatic. Nose: No congestion/rhinnorhea. Neck: No stridor.  No pain to palpation cervical spine. Range of motion is unrestricted and without difficulty. Hematological/Lymphatic/Immunilogical: No cervical lymphadenopathy. Cardiovascular: Normal rate, regular rhythm. Grossly normal heart sounds.  Good peripheral circulation. Respiratory: Normal respiratory effort.  No retractions. Lungs CTAB. Gastrointestinal: Soft and nontender. No distention. Musculoskeletal: Moves upper and lower extremities without any difficulty. There is tenderness on palpation of the lumbar spine especially around L5-S1 area. On examination of the right lower leg there is a well-healed surgical scar with tenderness on palpation of the anterior portion along the tibia. There is no soft tissue swelling present. Range of motion is  unrestricted. No abrasions, ecchymosis or erythema was noted on exam. Neurologic:  Normal speech and language. No gross focal neurologic deficits are appreciated. No gait instability. Skin:  Skin is warm, dry and intact.  Psychiatric: Mood and affect are normal. Speech and behavior are normal.  ____________________________________________   LABS (all labs ordered are listed, but only abnormal results are displayed)  Labs Reviewed  POC URINE PREG, ED  POCT PREGNANCY, URINE   RADIOLOGY  Cervical spine x-ray per radiologist is negative for bony abnormality. Lumbar spine x-ray per radiologist shows mild disc space narrowing at L5-S1. No acute bony abnormality was seen. Right tib-fib shows evidence of an old fracture with healing in both the tibia and fibula. Rod is in place and no acute findings were seen. I, Johnn Hai, personally viewed and evaluated these images (plain radiographs) as part of my medical decision making, as well as reviewing the written report by the radiologist. ____________________________________________   PROCEDURES  Procedure(s) performed: None  Procedures  Critical Care performed: No  ____________________________________________   INITIAL  IMPRESSION / ASSESSMENT AND PLAN / ED COURSE  Pertinent labs & imaging results that were available during my care of the patient were reviewed by me and considered in my medical decision making (see chart for details).  Patient was given a prescription for Norco to be taken until she can see her primary care doctor. She is encouraged to call her primary care doctor on Monday. Patient is also encouraged to take ibuprofen as needed for inflammation. She may use ice or heat to her muscles as needed for discomfort. ____________________________________________   FINAL CLINICAL IMPRESSION(S) / ED DIAGNOSES  Final diagnoses:  Contusion of lower back, initial encounter  Cervical strain, acute, initial encounter    Contusion of right lower leg, initial encounter      NEW MEDICATIONS STARTED DURING THIS VISIT:  New Prescriptions   HYDROCODONE-ACETAMINOPHEN (NORCO/VICODIN) 5-325 MG TABLET    Take 1 tablet by mouth every 4 (four) hours as needed for moderate pain.     Note:  This document was prepared using Dragon voice recognition software and may include unintentional dictation errors.    Johnn Hai, [name] 01/05/16 [place], MD 01/06/16 [phone]

## 2016-01-05 NOTE — ED Notes (Signed)
Patient states that she fell through her deck 3 days ago. There was a weak area in her deck and she fell through. Patient states that since then she has been having pain in her neck, lower back, and right lower leg.

## 2016-01-11 ENCOUNTER — Emergency Department: Payer: Medicaid Other

## 2016-01-11 ENCOUNTER — Encounter: Payer: Self-pay | Admitting: Emergency Medicine

## 2016-01-11 ENCOUNTER — Emergency Department
Admission: EM | Admit: 2016-01-11 | Discharge: 2016-01-11 | Disposition: A | Discharge status: Home / Self Care | Payer: Medicaid Other | Attending: Emergency Medicine | Admitting: Emergency Medicine

## 2016-01-11 DIAGNOSIS — I10 Essential (primary) hypertension: Secondary | ICD-10-CM | POA: Insufficient documentation

## 2016-01-11 DIAGNOSIS — R079 Chest pain, unspecified: Secondary | ICD-10-CM | POA: Insufficient documentation

## 2016-01-11 DIAGNOSIS — J449 Chronic obstructive pulmonary disease, unspecified: Secondary | ICD-10-CM | POA: Insufficient documentation

## 2016-01-11 DIAGNOSIS — Z7951 Long term (current) use of inhaled steroids: Secondary | ICD-10-CM | POA: Diagnosis not present

## 2016-01-11 DIAGNOSIS — I2510 Atherosclerotic heart disease of native coronary artery without angina pectoris: Secondary | ICD-10-CM | POA: Diagnosis not present

## 2016-01-11 DIAGNOSIS — I252 Old myocardial infarction: Secondary | ICD-10-CM | POA: Diagnosis not present

## 2016-01-11 DIAGNOSIS — Z7982 Long term (current) use of aspirin: Secondary | ICD-10-CM | POA: Insufficient documentation

## 2016-01-11 DIAGNOSIS — Z79899 Other long term (current) drug therapy: Secondary | ICD-10-CM | POA: Insufficient documentation

## 2016-01-11 LAB — BASIC METABOLIC PANEL
Anion gap: 12 (ref 5–15)
BUN: 9 mg/dL (ref 6–20)
CO2: 22 mmol/L (ref 22–32)
Calcium: 9.6 mg/dL (ref 8.9–10.3)
Chloride: 104 mmol/L (ref 101–111)
Creatinine, Ser: 0.67 mg/dL (ref 0.44–1.00)
GFR calc Af Amer: >60 mL/min (ref >60)
GFR calc non Af Amer: >60 mL/min (ref >60)
Glucose, Bld: 116 mg/dL — ABNORMAL HIGH (ref 65–99)
Potassium: 3.3 mmol/L — ABNORMAL LOW (ref 3.5–5.1)
Sodium: 138 mmol/L (ref 135–145)

## 2016-01-11 LAB — CBC
HCT: 44.6 % (ref 35.0–47.0)
Hemoglobin: 15.3 g/dL (ref 12.0–16.0)
MCH: 28.5 pg (ref 26.0–34.0)
MCHC: 34.4 g/dL (ref 32.0–36.0)
MCV: 82.9 fL (ref 80.0–100.0)
Platelets: 242 10*3/uL (ref 150–440)
RBC: 5.38 MIL/uL — ABNORMAL HIGH (ref 3.80–5.20)
RDW: 17.4 % — ABNORMAL HIGH (ref 11.5–14.5)
WBC: 10.1 10*3/uL (ref 3.6–11.0)

## 2016-01-11 LAB — TROPONIN I
Troponin I: <0.03 ng/mL (ref <0.03)
Troponin I: <0.03 ng/mL (ref <0.03)

## 2016-01-11 MED ORDER — DIAZEPAM 5 MG PO TABS
10.0000 mg | ORAL_TABLET | Freq: Once | ORAL | Status: AC
  Administered 2016-01-11: 10 mg via ORAL
  Filled 2016-01-11: qty 2

## 2016-01-11 NOTE — ED Provider Notes (Signed)
[facility] Emergency Department Provider Note        Time seen: ----------------------------------------- 4:22 PM on 01/11/2016 -----------------------------------------    I have reviewed the triage vital signs and the nursing notes.   HISTORY  Chief Complaint Chest Pain    HPI Sara Munoz is a 50 y.o. female who presents to ER for chest pain, left arm pain and high blood pressure. Patient states she had onset of her symptoms on Monday. She reports she took 2 sublingual nitroglycerin today with no change in her chest pain. Patient reports she's been out of her medicines for the last month. She does have shortness of breath at times.   Past Medical History  Diagnosis Date  . Hypertension   . Coronary artery disease     a. Mild-non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/2014  . COPD (chronic obstructive pulmonary disease) ([place])   . Hypercholesteremia   . Chronic back pain   . MI (myocardial infarction) [facility])     Patient Active Problem List   Diagnosis Date Noted  . Essential hypertension   . Hypokalemia   . Hypertensive urgency 10/09/2015  . COPD (chronic obstructive pulmonary disease) ([place]) 10/09/2015  . GERD (gastroesophageal reflux disease) 10/09/2015  . Chest pain 08/02/2015  . Non-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/2014 08/02/2015  . Essential HTN 08/02/2015  . Hyperlipidemia 08/02/2015  . Chronic back pain greater than 3 months duration 08/02/2015  . Chest pain with low risk for cardiac etiology 08/02/2015  . Costochondritis 08/02/2015    Past Surgical History  Procedure Laterality Date  . Orif femur fracture    . Orif tibia & fibula fractures    . Hemorroids    . Ovary surgery      Allergies Flexeril; Keflex; Levofloxacin; Toradol; Tramadol; and Zoloft  Social History Social History  Substance Use Topics  . Smoking status: Current Every Day Smoker -- 1.00 packs/day    Types: Cigarettes  . Smokeless tobacco: Never Used  . Alcohol  Use: 0.0 oz/week    0 Standard drinks or equivalent per week     Comment: drinks occasionally    Review of Systems Constitutional: Negative for fever. Cardiovascular: Positive for chest pain Respiratory: Positive shortness of breath Gastrointestinal: Negative for abdominal pain, vomiting and diarrhea. Genitourinary: Negative for dysuria. Musculoskeletal: Negative for back pain. Skin: Negative for rash. Neurological: Negative for headaches, focal weakness or numbness.  10-point ROS otherwise negative.  ____________________________________________   PHYSICAL EXAM:  VITAL SIGNS: ED Triage Vitals  Enc Vitals Group     BP 01/11/16 1557 181/118 mmHg     Pulse Rate 01/11/16 1557 124     Resp 01/11/16 1557 20     Temp 01/11/16 1557 98.4 F (36.9 C)     Temp Source 01/11/16 1557 Oral     SpO2 01/11/16 1557 100 %     Weight 01/11/16 1557 185 lb (83.915 kg)     Height 01/11/16 1557 5\' 4"  (1.626 m)     Head Cir --      Peak Flow --      Pain Score 01/11/16 1608 10     Pain Loc --      Pain Edu? --      Excl. in [place]? --     Constitutional: Alert and oriented. Well appearing and in no distress. Eyes: Conjunctivae are normal. PERRL. Normal extraocular movements. ENT   Head: Normocephalic and atraumatic.   Nose: No congestion/rhinnorhea.   Mouth/Throat: Mucous membranes are  moist.   Neck: No stridor. Cardiovascular: Normal rate, regular rhythm. No murmurs, rubs, or gallops. Respiratory: Normal respiratory effort without tachypnea nor retractions. Breath sounds are clear and equal bilaterally. No wheezes/rales/rhonchi. Gastrointestinal: Soft and nontender. Normal bowel sounds Musculoskeletal: Nontender with normal range of motion in all extremities. No lower extremity tenderness nor edema. Neurologic:  Normal speech and language. No gross focal neurologic deficits are appreciated.  Skin:  Skin is warm, dry and intact. No rash noted. Psychiatric: Mood and affect are  normal. Speech and behavior are normal.  ____________________________________________  EKG: Interpreted by me.Sinus tachycardia with rate of 120 bpm, normal PR interval, normal QRS, normal QT interval. Normal axis. Possible anterior infarct age indeterminate.  ____________________________________________  ED COURSE:  Pertinent labs & imaging results that were available during my care of the patient were reviewed by me and considered in my medical decision making (see chart for details). Patient presents with nonspecific chest pain. I will check basic labs and imaging. ____________________________________________    LABS (pertinent positives/negatives)  Labs Reviewed  BASIC METABOLIC PANEL - Abnormal; Notable for the following:    Potassium 3.3 (*)    Glucose, Bld 116 (*)    All other components within normal limits  CBC - Abnormal; Notable for the following:    RBC 5.38 (*)    RDW 17.4 (*)    All other components within normal limits  TROPONIN I  TROPONIN I    RADIOLOGY Images were viewed by me  Chest x-ray IMPRESSION: No active cardiopulmonary disease.  ____________________________________________  FINAL ASSESSMENT AND PLAN  Chest pain, hypertension, medication refill  Plan: Patient with labs and imaging as dictated above. Patient's no acute distress, repeat troponin is unremarkable. Reportedly she had an insignificant heart catheter 2 years ago. This seems to be noncardiac chest pain. She is stable for outpatient follow-up with her doctor.   Earleen Newport, MD   Note: This dictation was prepared with Dragon dictation. Any transcriptional errors that result from this process are unintentional   Earleen Newport, MD 01/11/16 1820

## 2016-01-11 NOTE — Discharge Instructions (Signed)
Nonspecific Chest Pain  °Chest pain can be caused by many different conditions. There is always a chance that your pain could be related to something serious, such as a heart attack or a blood clot in your lungs. Chest pain can also be caused by conditions that are not life-threatening. If you have chest pain, it is very important to follow up with your health care provider. °CAUSES  °Chest pain can be caused by: °· Heartburn. °· Pneumonia or bronchitis. °· Anxiety or stress. °· Inflammation around your heart (pericarditis) or lung (pleuritis or pleurisy). °· A blood clot in your lung. °· A collapsed lung (pneumothorax). It can develop suddenly on its own (spontaneous pneumothorax) or from trauma to the chest. °· Shingles infection (varicella-zoster virus). °· Heart attack. °· Damage to the bones, muscles, and cartilage that make up your chest wall. This can include: °¨ Bruised bones due to injury. °¨ Strained muscles or cartilage due to frequent or repeated coughing or overwork. °¨ Fracture to one or more ribs. °¨ Sore cartilage due to inflammation (costochondritis). °RISK FACTORS  °Risk factors for chest pain may include: °· Activities that increase your risk for trauma or injury to your chest. °· Respiratory infections or conditions that cause frequent coughing. °· Medical conditions or overeating that can cause heartburn. °· Heart disease or family history of heart disease. °· Conditions or health behaviors that increase your risk of developing a blood clot. °· Having had chicken pox (varicella zoster). °SIGNS AND SYMPTOMS °Chest pain can feel like: °· Burning or tingling on the surface of your chest or deep in your chest. °· Crushing, pressure, aching, or squeezing pain. °· Dull or sharp pain that is worse when you move, cough, or take a deep breath. °· Pain that is also felt in your back, neck, shoulder, or arm, or pain that spreads to any of these areas. °Your chest pain may come and go, or it may stay  constant. °DIAGNOSIS °Lab tests or other studies may be needed to find the cause of your pain. Your health care provider may have you take a test called an ambulatory ECG (electrocardiogram). An ECG records your heartbeat patterns at the time the test is performed. You may also have other tests, such as: °· Transthoracic echocardiogram (TTE). During echocardiography, sound waves are used to create a picture of all of the heart structures and to look at how blood flows through your heart. °· Transesophageal echocardiogram (TEE). This is a more advanced imaging test that obtains images from inside your body. It allows your health care provider to see your heart in finer detail. °· Cardiac monitoring. This allows your health care provider to monitor your heart rate and rhythm in real time. °· Holter monitor. This is a portable device that records your heartbeat and can help to diagnose abnormal heartbeats. It allows your health care provider to track your heart activity for several days, if needed. °· Stress tests. These can be done through exercise or by taking medicine that makes your heart beat more quickly. °· Blood tests. °· Imaging tests. °TREATMENT  °Your treatment depends on what is causing your chest pain. Treatment may include: °· Medicines. These may include: °¨ Acid blockers for heartburn. °¨ Anti-inflammatory medicine. °¨ Pain medicine for inflammatory conditions. °¨ Antibiotic medicine, if an infection is present. °¨ Medicines to dissolve blood clots. °¨ Medicines to treat coronary artery disease. °· Supportive care for conditions that do not require medicines. This may include: °¨ Resting. °¨ Applying heat   or cold packs to injured areas. °¨ Limiting activities until pain decreases. °HOME CARE INSTRUCTIONS °· If you were prescribed an antibiotic medicine, finish it all even if you start to feel better. °· Avoid any activities that bring on chest pain. °· Do not use any tobacco products, including  cigarettes, chewing tobacco, or electronic cigarettes. If you need help quitting, ask your health care provider. °· Do not drink alcohol. °· Take medicines only as directed by your health care provider. °· Keep all follow-up visits as directed by your health care provider. This is important. This includes any further testing if your chest pain does not go away. °· If heartburn is the cause for your chest pain, you may be told to keep your head raised (elevated) while sleeping. This reduces the chance that acid will go from your stomach into your esophagus. °· Make lifestyle changes as directed by your health care provider. These may include: °¨ Getting regular exercise. Ask your health care provider to suggest some activities that are safe for you. °¨ Eating a heart-healthy diet. A registered dietitian can help you to learn healthy eating options. °¨ Maintaining a healthy weight. °¨ Managing diabetes, if necessary. °¨ Reducing stress. °SEEK MEDICAL CARE IF: °· Your chest pain does not go away after treatment. °· You have a rash with blisters on your chest. °· You have a fever. °SEEK IMMEDIATE MEDICAL CARE IF:  °· Your chest pain is worse. °· You have an increasing cough, or you cough up blood. °· You have severe abdominal pain. °· You have severe weakness. °· You faint. °· You have chills. °· You have sudden, unexplained chest discomfort. °· You have sudden, unexplained discomfort in your arms, back, neck, or jaw. °· You have shortness of breath at any time. °· You suddenly start to sweat, or your skin gets clammy. °· You feel nauseous or you vomit. °· You suddenly feel light-headed or dizzy. °· Your heart begins to beat quickly, or it feels like it is skipping beats. °These symptoms may represent a serious problem that is an emergency. Do not wait to see if the symptoms will go away. Get medical help right away. Call your local emergency services (911 in the [place] Do not drive yourself to the hospital. °  °This  information is not intended to replace advice given to you by your health care provider. Make sure you discuss any questions you have with your health care provider. °  °Document Released: 03/20/2005 Document Revised: 07/01/2014 Document Reviewed: 01/14/2014 °Elsevier Interactive Patient Education ©2016 [employer] ° °

## 2016-01-11 NOTE — ED Notes (Signed)
C/O chest pain, left arm pain, blood pressure is high. Onset of symptoms Monday.

## 2016-01-11 NOTE — ED Notes (Signed)
MD at bedside. 

## 2016-01-11 NOTE — ED Notes (Signed)
Pt sts that she took 2 SL nitro today w/ no change in CP.  Pt sts that she has been out of her home medications for 1 month.  Sts she is SOB at times.

## 2016-01-31 ENCOUNTER — Telehealth: Payer: Self-pay | Admitting: Pain Medicine

## 2016-01-31 NOTE — Telephone Encounter (Signed)
Scheduled patient 02-27-16 2:00

## 2016-02-05 ENCOUNTER — Emergency Department
Admission: EM | Admit: 2016-02-05 | Discharge: 2016-02-05 | Disposition: A | Discharge status: Home / Self Care | Payer: Medicaid Other | Attending: Student in an Organized Health Care Education/Training Program | Admitting: Student in an Organized Health Care Education/Training Program

## 2016-02-05 ENCOUNTER — Encounter: Payer: Self-pay | Admitting: Emergency Medicine

## 2016-02-05 DIAGNOSIS — G8929 Other chronic pain: Secondary | ICD-10-CM | POA: Diagnosis not present

## 2016-02-05 DIAGNOSIS — I252 Old myocardial infarction: Secondary | ICD-10-CM | POA: Diagnosis not present

## 2016-02-05 DIAGNOSIS — Z7982 Long term (current) use of aspirin: Secondary | ICD-10-CM | POA: Diagnosis not present

## 2016-02-05 DIAGNOSIS — M5441 Lumbago with sciatica, right side: Secondary | ICD-10-CM | POA: Insufficient documentation

## 2016-02-05 DIAGNOSIS — J449 Chronic obstructive pulmonary disease, unspecified: Secondary | ICD-10-CM | POA: Diagnosis not present

## 2016-02-05 DIAGNOSIS — I2510 Atherosclerotic heart disease of native coronary artery without angina pectoris: Secondary | ICD-10-CM | POA: Diagnosis not present

## 2016-02-05 DIAGNOSIS — M545 Low back pain: Secondary | ICD-10-CM | POA: Diagnosis present

## 2016-02-05 DIAGNOSIS — I10 Essential (primary) hypertension: Secondary | ICD-10-CM | POA: Insufficient documentation

## 2016-02-05 DIAGNOSIS — F17210 Nicotine dependence, cigarettes, uncomplicated: Secondary | ICD-10-CM | POA: Diagnosis not present

## 2016-02-05 DIAGNOSIS — Z79899 Other long term (current) drug therapy: Secondary | ICD-10-CM | POA: Insufficient documentation

## 2016-02-05 DIAGNOSIS — M5431 Sciatica, right side: Secondary | ICD-10-CM

## 2016-02-05 MED ORDER — TIZANIDINE HCL 4 MG PO TABS: 8.0000 mg | ORAL_TABLET | Freq: Three times a day (TID) | ORAL | 0 refills | Status: DC

## 2016-02-05 MED ORDER — OXYCODONE HCL 5 MG PO TABS
10.0000 mg | ORAL_TABLET | Freq: Once | ORAL | Status: AC
  Administered 2016-02-05: 10 mg via ORAL
  Filled 2016-02-05: qty 2

## 2016-02-05 MED ORDER — PREDNISONE 10 MG (21) PO TBPK: ORAL_TABLET | ORAL | 0 refills | Status: DC

## 2016-02-05 NOTE — ED Provider Notes (Signed)
[facility] Emergency Department Provider Note ____________________________________________  Time seen: Approximately 5:07 PM  I have reviewed the triage vital signs and the nursing notes.   HISTORY  Chief Complaint Back Pain    HPI Sara Munoz is a 50 y.o. female who presents to the emergency department for treatment of chronic back pain.Patient states that 3 days ago her chronic pain became worse and started to have pain shooting down her right leg. She denies loss of bowel or bladder control. She is taking tizanidine and NSAIDs without relief. She has an appointment scheduled with pain management for early September.  Past Medical History:  Diagnosis Date  . Chronic back pain   . COPD (chronic obstructive pulmonary disease) ([place])   . Coronary artery disease    a. Mild-non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/2014  . Hypercholesteremia   . Hypertension   . MI (myocardial infarction) [facility])     Patient Active Problem List   Diagnosis Date Noted  . Essential hypertension   . Hypokalemia   . Hypertensive urgency 10/09/2015  . COPD (chronic obstructive pulmonary disease) ([place]) 10/09/2015  . GERD (gastroesophageal reflux disease) 10/09/2015  . Chest pain 08/02/2015  . Non-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/2014 08/02/2015  . Essential HTN 08/02/2015  . Hyperlipidemia 08/02/2015  . Chronic back pain greater than 3 months duration 08/02/2015  . Chest pain with low risk for cardiac etiology 08/02/2015  . Costochondritis 08/02/2015    Past Surgical History:  Procedure Laterality Date  . hemorroids    . ORIF FEMUR FRACTURE    . ORIF TIBIA & FIBULA FRACTURES    . OVARY SURGERY      Prior to Admission medications   Medication Sig Start Date End Date Taking? Authorizing Provider  albuterol (PROVENTIL HFA;VENTOLIN HFA) 108 (90 Base) MCG/ACT inhaler Inhale 2 puffs into the lungs every 4 (four) hours as needed for wheezing or shortness of breath. 08/03/15   Demetrios Loll, MD  ALPRAZolam Duanne Moron) 0.5 MG tablet Take 0.5 mg by mouth 3 (three) times daily as needed for anxiety.    Historical Provider, MD  aspirin EC 81 MG tablet Take 81 mg by mouth daily.    Historical Provider, MD  atenolol (TENORMIN) 50 MG tablet Take 50 mg by mouth daily.    Historical Provider, MD  atorvastatin (LIPITOR) 20 MG tablet Take 20 mg by mouth at bedtime.    Historical Provider, MD  azithromycin (ZITHROMAX Z-PAK) 250 MG tablet Take 2 tablets (500 mg) on  Day 1,  followed by 1 tablet (250 mg) once daily on Days 2 through 5. 11/08/15   [name], [name]  budesonide-formoterol (SYMBICORT) 160-4.5 MCG/ACT inhaler Inhale 2 puffs into the lungs 2 (two) times daily. 05/31/15   Gregor Hams, MD  clonazePAM (KLONOPIN) 1 MG tablet Take 1 tablet by mouth 2 (two) times daily.     Historical Provider, MD  dexlansoprazole (DEXILANT) 60 MG capsule Take 60 mg by mouth daily.    Historical Provider, MD  diazepam (VALIUM) 2 MG tablet Take 1 tablet (2 mg total) by mouth every 8 (eight) hours as needed for anxiety. 08/03/15   Demetrios Loll, MD  docusate sodium (COLACE) 100 MG capsule Take 200 mg by mouth 2 (two) times daily as needed for mild constipation.    Historical Provider, MD  gabapentin (NEURONTIN) 300 MG capsule Take 2 capsules (600 mg total) by mouth 3 (three) times daily. 08/03/15   Demetrios Loll, MD  HYDROcodone-acetaminophen (  NORCO/VICODIN) 5-325 MG tablet Take 1 tablet by mouth every 4 (four) hours as needed for moderate pain. 01/05/16   Johnn Hai, [name]  linaclotide (LINZESS) 290 MCG CAPS capsule Take 290 mcg by mouth daily before breakfast.    Historical Provider, MD  lisinopril (PRINIVIL,ZESTRIL) 20 MG tablet Take 20 mg by mouth daily.    Historical Provider, MD  magnesium oxide (MAG-OX) 400 (241.3 Mg) MG tablet Take 400 mg by mouth daily.    Historical Provider, MD  metoprolol tartrate (LOPRESSOR) 25 MG tablet Take 1 tablet (25 mg total) by mouth 2 (two) times daily. 08/03/15   Demetrios Loll, MD   morphine (MS CONTIN) 30 MG 12 hr tablet Take 30 mg by mouth 3 (three) times daily.    Historical Provider, MD  nicotine (NICODERM CQ - DOSED IN MG/24 HOURS) 14 mg/24hr patch Place 1 patch (14 mg total) onto the skin daily. 06/08/15   Gregor Hams, MD  nitroGLYCERIN (NITROSTAT) 0.4 MG SL tablet Place 1 tablet (0.4 mg total) under the tongue every 5 (five) minutes x 3 doses as needed for chest pain. 08/03/15   Demetrios Loll, MD  ondansetron (ZOFRAN) 8 MG tablet Take 8 mg by mouth every 8 (eight) hours as needed for nausea or vomiting.    Historical Provider, MD  Oxycodone HCl 10 MG TABS Take 10 mg by mouth 4 (four) times daily.    Historical Provider, MD  potassium chloride SA (K-DUR,KLOR-CON) 20 MEQ tablet Take 20 mEq by mouth 2 (two) times daily.    Historical Provider, MD  predniSONE (STERAPRED UNI-PAK 21 TAB) 10 MG (21) TBPK tablet Take 6 tablets on day 1 Take 5 tablets on day 2 Take 4 tablets on day 3 Take 3 tablets on day 4 Take 2 tablets on day 5 Take 1 tablet on day 6 02/05/16   Robbye Dede B Modelle Vollmer, FNP  RANEXA 500 MG 12 hr tablet Take 1 tablet (500 mg total) by mouth 2 (two) times daily. 08/03/15   Demetrios Loll, MD  tiZANidine (ZANAFLEX) 4 MG tablet Take 2 tablets (8 mg total) by mouth 3 (three) times daily. 02/05/16   Victorino Dike, FNP    Allergies Flexeril [cyclobenzaprine]; Keflex [cephalexin]; Levofloxacin; Toradol [ketorolac tromethamine]; Tramadol; and Zoloft [sertraline hcl]  Family History  Problem Relation Age of Onset  . Heart disease Mother   . Lung cancer Mother   . Ovarian cancer Mother     Social History Social History  Substance Use Topics  . Smoking status: Current Every Day Smoker    Packs/day: 1.00    Types: Cigarettes  . Smokeless tobacco: Never Used  . Alcohol use 0.0 oz/week     Comment: drinks occasionally    Review of Systems Constitutional: No recent illness. Cardiovascular: Denies chest pain or palpitations. Respiratory: Denies shortness of  breath. Musculoskeletal: Pain in Lumbar with radiation to the right lower extremity Skin: Negative for rash, wound, lesion. Neurological: Negative for focal weakness or numbness.  ____________________________________________   PHYSICAL EXAM:  VITAL SIGNS: ED Triage Vitals  Enc Vitals Group     BP 02/05/16 1609 107/74     Pulse Rate 02/05/16 1609 67     Resp 02/05/16 1609 20     Temp 02/05/16 1609 97.7 F (36.5 C)     Temp Source 02/05/16 1609 Oral     SpO2 02/05/16 1609 100 %     Weight 02/05/16 1609 180 lb (81.6 kg)     Height 02/05/16 1609 5'  4" (1.626 m)     Head Circumference --      Peak Flow --      Pain Score 02/05/16 1610 9     Pain Loc --      Pain Edu? --      Excl. in [place]? --     Constitutional: Alert and oriented. Well appearing and in no acute distress. Eyes: Conjunctivae are normal. EOMI. Head: Atraumatic. Neck: No stridor.  Respiratory: Normal respiratory effort.   Musculoskeletal: Straight leg raise positive at about 30. No midline tenderness on palpation of the lumbar spine. Neurologic:  Normal speech and language. No gross focal neurologic deficits are appreciated. Speech is normal. No gait instability. Skin:  Skin is warm, dry and intact. Atraumatic. Psychiatric: Mood and affect are normal. Speech and behavior are normal.  ____________________________________________   LABS (all labs ordered are listed, but only abnormal results are displayed)  Labs Reviewed - No data to display ____________________________________________  RADIOLOGY  Not indicated ____________________________________________   PROCEDURES  Procedure(s) performed: None   ____________________________________________   INITIAL IMPRESSION / ASSESSMENT AND PLAN / ED COURSE  Pertinent labs & imaging results that were available during my care of the patient were reviewed by me and considered in my medical decision making (see chart for details).  Patient was given a dose  of Roxicodone R while in the emergency department. She was made aware of the chronic pain management policy. We will increase her tizanidine to 8 mg and put her on a prednisone taper dose pack. She was instructed to call to see if she can get an earlier appointment with pain management. She was instructed to return to the emergency department for any change in her symptoms or new concerns. ____________________________________________   FINAL CLINICAL IMPRESSION(S) / ED DIAGNOSES  Final diagnoses:  Sciatica of right side       Victorino Dike, FNP 02/05/16 1848    Merlyn Lot, MD 02/05/16 2128

## 2016-02-05 NOTE — ED Notes (Signed)
Pt c/o lower back pain for the past 3 days - pt reports that the pain is radiating down the right leg - pt states she is unable to sleep - pt states that she has been "drawing up real bad" in her ribs and back

## 2016-02-05 NOTE — ED Triage Notes (Signed)
Patient presents to the ED with chronic back pain that has been increasing over the past 3 days.  Patient ambulatory to triage.  Patient is in no obvious distress at this time.

## 2016-02-22 ENCOUNTER — Ambulatory Visit
Admission: RE | Admit: 2016-02-22 | Discharge: 2016-02-22 | Disposition: A | Discharge status: Home / Self Care | Payer: Medicaid Other | Source: Ambulatory Visit | Attending: Internal Medicine | Admitting: Internal Medicine

## 2016-02-22 ENCOUNTER — Encounter: Payer: Self-pay | Admitting: Internal Medicine

## 2016-02-22 ENCOUNTER — Encounter
Admission: RE | Disposition: A | Discharge status: Home / Self Care | Payer: Self-pay | Source: Ambulatory Visit | Attending: Internal Medicine

## 2016-02-22 DIAGNOSIS — Z7982 Long term (current) use of aspirin: Secondary | ICD-10-CM | POA: Diagnosis not present

## 2016-02-22 DIAGNOSIS — J449 Chronic obstructive pulmonary disease, unspecified: Secondary | ICD-10-CM | POA: Insufficient documentation

## 2016-02-22 DIAGNOSIS — F17200 Nicotine dependence, unspecified, uncomplicated: Secondary | ICD-10-CM | POA: Diagnosis not present

## 2016-02-22 DIAGNOSIS — R0602 Shortness of breath: Secondary | ICD-10-CM | POA: Diagnosis present

## 2016-02-22 DIAGNOSIS — I10 Essential (primary) hypertension: Secondary | ICD-10-CM | POA: Insufficient documentation

## 2016-02-22 DIAGNOSIS — M1990 Unspecified osteoarthritis, unspecified site: Secondary | ICD-10-CM | POA: Insufficient documentation

## 2016-02-22 DIAGNOSIS — K219 Gastro-esophageal reflux disease without esophagitis: Secondary | ICD-10-CM | POA: Diagnosis not present

## 2016-02-22 DIAGNOSIS — Z79899 Other long term (current) drug therapy: Secondary | ICD-10-CM | POA: Diagnosis not present

## 2016-02-22 DIAGNOSIS — E785 Hyperlipidemia, unspecified: Secondary | ICD-10-CM | POA: Insufficient documentation

## 2016-02-22 DIAGNOSIS — I2510 Atherosclerotic heart disease of native coronary artery without angina pectoris: Secondary | ICD-10-CM | POA: Diagnosis not present

## 2016-02-22 DIAGNOSIS — Z888 Allergy status to other drugs, medicaments and biological substances status: Secondary | ICD-10-CM | POA: Insufficient documentation

## 2016-02-22 HISTORY — PX: CARDIAC CATHETERIZATION: SHX172

## 2016-02-22 SURGERY — LEFT HEART CATH AND CORONARY ANGIOGRAPHY
Anesthesia: Moderate Sedation | Laterality: Left

## 2016-02-22 SURGERY — LEFT HEART CATH AND CORONARY ANGIOGRAPHY
Anesthesia: Moderate Sedation

## 2016-02-22 MED ORDER — SODIUM CHLORIDE 0.9% FLUSH: 3.0000 mL | INTRAVENOUS | Status: DC | PRN

## 2016-02-22 MED ORDER — SODIUM CHLORIDE 0.9 % IV SOLN: 250.0000 mL | INTRAVENOUS | Status: DC | PRN

## 2016-02-22 MED ORDER — MIDAZOLAM HCL 2 MG/2ML IJ SOLN
INTRAMUSCULAR | Status: DC | PRN
  Administered 2016-02-22: 1 mg via INTRAVENOUS

## 2016-02-22 MED ORDER — ACETAMINOPHEN 325 MG PO TABS: 650.0000 mg | ORAL_TABLET | ORAL | Status: DC | PRN

## 2016-02-22 MED ORDER — SODIUM CHLORIDE 0.9% FLUSH: 3.0000 mL | Freq: Two times a day (BID) | INTRAVENOUS | Status: DC

## 2016-02-22 MED ORDER — FENTANYL CITRATE (PF) 100 MCG/2ML IJ SOLN
INTRAMUSCULAR | Status: AC
  Filled 2016-02-22: qty 2

## 2016-02-22 MED ORDER — SODIUM CHLORIDE 0.9 % WEIGHT BASED INFUSION
1.0000 mL/kg/h | INTRAVENOUS | Status: DC
Start: 2016-02-23 — End: 2016-02-22

## 2016-02-22 MED ORDER — ASPIRIN 81 MG PO CHEW: 81.0000 mg | CHEWABLE_TABLET | ORAL | Status: DC

## 2016-02-22 MED ORDER — ONDANSETRON HCL 4 MG/2ML IJ SOLN: 4.0000 mg | Freq: Four times a day (QID) | INTRAMUSCULAR | Status: DC | PRN

## 2016-02-22 MED ORDER — SODIUM CHLORIDE 0.9 % WEIGHT BASED INFUSION: 3.0000 mL/kg/h | INTRAVENOUS | Status: DC

## 2016-02-22 MED ORDER — HEPARIN (PORCINE) IN NACL 2-0.9 UNIT/ML-% IJ SOLN
INTRAMUSCULAR | Status: AC
  Filled 2016-02-22: qty 1000

## 2016-02-22 MED ORDER — FENTANYL CITRATE (PF) 100 MCG/2ML IJ SOLN
INTRAMUSCULAR | Status: DC | PRN
  Administered 2016-02-22: 25 ug via INTRAVENOUS

## 2016-02-22 MED ORDER — IOPAMIDOL (ISOVUE-300) INJECTION 61%
INTRAVENOUS | Status: DC | PRN
  Administered 2016-02-22: 110 mL via INTRA_ARTERIAL

## 2016-02-22 MED ORDER — MIDAZOLAM HCL 2 MG/2ML IJ SOLN
INTRAMUSCULAR | Status: AC
  Filled 2016-02-22: qty 2

## 2016-02-22 MED ORDER — SODIUM CHLORIDE 0.9 % WEIGHT BASED INFUSION
3.0000 mL/kg/h | INTRAVENOUS | Status: DC
  Administered 2016-02-22: 3 mL/kg/h via INTRAVENOUS

## 2016-02-22 SURGICAL SUPPLY — 9 items
CATH 5FR JL4 DIAGNOSTIC (CATHETERS) ×2 IMPLANT
CATH 5FR JR4 DIAGNOSTIC (CATHETERS) ×3 IMPLANT
CATH INFINITI 5FR ANG PIGTAIL (CATHETERS) ×3 IMPLANT
DEVICE CLOSURE MYNXGRIP 5F (Vascular Products) ×3 IMPLANT
KIT MANI 3VAL PERCEP (MISCELLANEOUS) ×3 IMPLANT
NEEDLE PERC 18GX7CM (NEEDLE) ×3 IMPLANT
PACK CARDIAC CATH (CUSTOM PROCEDURE TRAY) ×3 IMPLANT
SHEATH AVANTI 5FR X 11CM (SHEATH) ×3 IMPLANT
WIRE EMERALD 3MM-J .035X150CM (WIRE) ×3 IMPLANT

## 2016-02-22 NOTE — Discharge Instructions (Signed)

## 2016-02-27 ENCOUNTER — Encounter: Payer: Self-pay | Admitting: Pain Medicine

## 2016-02-27 ENCOUNTER — Ambulatory Visit: Payer: Medicaid Other | Attending: Pain Medicine | Admitting: Pain Medicine

## 2016-02-27 VITALS — BP 99/69 | HR 69 | Temp 98.6°F | Resp 18 | Ht 64.0 in | Wt 180.0 lb

## 2016-02-27 DIAGNOSIS — E876 Hypokalemia: Secondary | ICD-10-CM | POA: Insufficient documentation

## 2016-02-27 DIAGNOSIS — M545 Low back pain, unspecified: Secondary | ICD-10-CM

## 2016-02-27 DIAGNOSIS — M79602 Pain in left arm: Secondary | ICD-10-CM | POA: Insufficient documentation

## 2016-02-27 DIAGNOSIS — E7800 Pure hypercholesterolemia, unspecified: Secondary | ICD-10-CM | POA: Insufficient documentation

## 2016-02-27 DIAGNOSIS — F17210 Nicotine dependence, cigarettes, uncomplicated: Secondary | ICD-10-CM | POA: Insufficient documentation

## 2016-02-27 DIAGNOSIS — M79601 Pain in right arm: Secondary | ICD-10-CM | POA: Insufficient documentation

## 2016-02-27 DIAGNOSIS — M940 Chondrocostal junction syndrome [Tietze]: Secondary | ICD-10-CM | POA: Diagnosis not present

## 2016-02-27 DIAGNOSIS — M4726 Other spondylosis with radiculopathy, lumbar region: Secondary | ICD-10-CM | POA: Insufficient documentation

## 2016-02-27 DIAGNOSIS — J449 Chronic obstructive pulmonary disease, unspecified: Secondary | ICD-10-CM | POA: Insufficient documentation

## 2016-02-27 DIAGNOSIS — Z79891 Long term (current) use of opiate analgesic: Secondary | ICD-10-CM | POA: Insufficient documentation

## 2016-02-27 DIAGNOSIS — G8929 Other chronic pain: Secondary | ICD-10-CM | POA: Insufficient documentation

## 2016-02-27 DIAGNOSIS — F1910 Other psychoactive substance abuse, uncomplicated: Secondary | ICD-10-CM | POA: Diagnosis not present

## 2016-02-27 DIAGNOSIS — K5909 Other constipation: Secondary | ICD-10-CM | POA: Insufficient documentation

## 2016-02-27 DIAGNOSIS — M5481 Occipital neuralgia: Secondary | ICD-10-CM | POA: Insufficient documentation

## 2016-02-27 DIAGNOSIS — K219 Gastro-esophageal reflux disease without esophagitis: Secondary | ICD-10-CM | POA: Diagnosis not present

## 2016-02-27 DIAGNOSIS — M1990 Unspecified osteoarthritis, unspecified site: Secondary | ICD-10-CM | POA: Insufficient documentation

## 2016-02-27 DIAGNOSIS — I252 Old myocardial infarction: Secondary | ICD-10-CM | POA: Insufficient documentation

## 2016-02-27 DIAGNOSIS — I2510 Atherosclerotic heart disease of native coronary artery without angina pectoris: Secondary | ICD-10-CM | POA: Insufficient documentation

## 2016-02-27 DIAGNOSIS — M549 Dorsalgia, unspecified: Secondary | ICD-10-CM

## 2016-02-27 DIAGNOSIS — R51 Headache: Secondary | ICD-10-CM | POA: Diagnosis not present

## 2016-02-27 DIAGNOSIS — M25511 Pain in right shoulder: Secondary | ICD-10-CM | POA: Diagnosis not present

## 2016-02-27 DIAGNOSIS — Z8673 Personal history of transient ischemic attack (TIA), and cerebral infarction without residual deficits: Secondary | ICD-10-CM | POA: Insufficient documentation

## 2016-02-27 DIAGNOSIS — R110 Nausea: Secondary | ICD-10-CM | POA: Insufficient documentation

## 2016-02-27 DIAGNOSIS — F1190 Opioid use, unspecified, uncomplicated: Secondary | ICD-10-CM

## 2016-02-27 DIAGNOSIS — M79603 Pain in arm, unspecified: Secondary | ICD-10-CM

## 2016-02-27 DIAGNOSIS — M25512 Pain in left shoulder: Secondary | ICD-10-CM | POA: Insufficient documentation

## 2016-02-27 DIAGNOSIS — M79606 Pain in leg, unspecified: Secondary | ICD-10-CM | POA: Diagnosis not present

## 2016-02-27 DIAGNOSIS — G4730 Sleep apnea, unspecified: Secondary | ICD-10-CM | POA: Insufficient documentation

## 2016-02-27 DIAGNOSIS — I10 Essential (primary) hypertension: Secondary | ICD-10-CM | POA: Insufficient documentation

## 2016-02-27 DIAGNOSIS — M25561 Pain in right knee: Secondary | ICD-10-CM

## 2016-02-27 DIAGNOSIS — M160 Bilateral primary osteoarthritis of hip: Secondary | ICD-10-CM | POA: Diagnosis not present

## 2016-02-27 DIAGNOSIS — M546 Pain in thoracic spine: Secondary | ICD-10-CM | POA: Diagnosis not present

## 2016-02-27 DIAGNOSIS — I739 Peripheral vascular disease, unspecified: Secondary | ICD-10-CM | POA: Insufficient documentation

## 2016-02-27 DIAGNOSIS — F411 Generalized anxiety disorder: Secondary | ICD-10-CM | POA: Insufficient documentation

## 2016-02-27 DIAGNOSIS — M25551 Pain in right hip: Secondary | ICD-10-CM | POA: Insufficient documentation

## 2016-02-27 DIAGNOSIS — Z7982 Long term (current) use of aspirin: Secondary | ICD-10-CM | POA: Insufficient documentation

## 2016-02-27 DIAGNOSIS — Z5181 Encounter for therapeutic drug level monitoring: Secondary | ICD-10-CM | POA: Insufficient documentation

## 2016-02-27 DIAGNOSIS — M79604 Pain in right leg: Secondary | ICD-10-CM

## 2016-02-27 DIAGNOSIS — M25562 Pain in left knee: Secondary | ICD-10-CM | POA: Insufficient documentation

## 2016-02-27 DIAGNOSIS — M25559 Pain in unspecified hip: Secondary | ICD-10-CM | POA: Insufficient documentation

## 2016-02-27 DIAGNOSIS — M79605 Pain in left leg: Secondary | ICD-10-CM | POA: Diagnosis present

## 2016-02-27 DIAGNOSIS — G4733 Obstructive sleep apnea (adult) (pediatric): Secondary | ICD-10-CM | POA: Insufficient documentation

## 2016-02-27 DIAGNOSIS — R519 Headache, unspecified: Secondary | ICD-10-CM | POA: Insufficient documentation

## 2016-02-27 DIAGNOSIS — M542 Cervicalgia: Secondary | ICD-10-CM

## 2016-02-27 DIAGNOSIS — Z79899 Other long term (current) drug therapy: Secondary | ICD-10-CM | POA: Insufficient documentation

## 2016-02-27 DIAGNOSIS — E785 Hyperlipidemia, unspecified: Secondary | ICD-10-CM | POA: Diagnosis not present

## 2016-02-27 DIAGNOSIS — G459 Transient cerebral ischemic attack, unspecified: Secondary | ICD-10-CM | POA: Insufficient documentation

## 2016-02-27 DIAGNOSIS — M25552 Pain in left hip: Secondary | ICD-10-CM | POA: Insufficient documentation

## 2016-02-27 DIAGNOSIS — D638 Anemia in other chronic diseases classified elsewhere: Secondary | ICD-10-CM | POA: Insufficient documentation

## 2016-02-27 DIAGNOSIS — M5416 Radiculopathy, lumbar region: Secondary | ICD-10-CM

## 2016-02-27 DIAGNOSIS — G4486 Cervicogenic headache: Secondary | ICD-10-CM

## 2016-02-27 DIAGNOSIS — N649 Disorder of breast, unspecified: Secondary | ICD-10-CM | POA: Insufficient documentation

## 2016-02-27 DIAGNOSIS — Z0189 Encounter for other specified special examinations: Secondary | ICD-10-CM | POA: Insufficient documentation

## 2016-02-27 DIAGNOSIS — M4722 Other spondylosis with radiculopathy, cervical region: Secondary | ICD-10-CM | POA: Insufficient documentation

## 2016-02-27 DIAGNOSIS — F4310 Post-traumatic stress disorder, unspecified: Secondary | ICD-10-CM | POA: Insufficient documentation

## 2016-02-27 DIAGNOSIS — M533 Sacrococcygeal disorders, not elsewhere classified: Secondary | ICD-10-CM | POA: Insufficient documentation

## 2016-02-27 DIAGNOSIS — I6529 Occlusion and stenosis of unspecified carotid artery: Secondary | ICD-10-CM | POA: Insufficient documentation

## 2016-02-27 DIAGNOSIS — M5412 Radiculopathy, cervical region: Secondary | ICD-10-CM

## 2016-02-27 DIAGNOSIS — M47816 Spondylosis without myelopathy or radiculopathy, lumbar region: Secondary | ICD-10-CM | POA: Insufficient documentation

## 2016-02-27 DIAGNOSIS — Z9889 Other specified postprocedural states: Secondary | ICD-10-CM | POA: Insufficient documentation

## 2016-02-27 HISTORY — DX: Opioid use, unspecified, uncomplicated: F11.90

## 2016-02-27 MED ORDER — ORPHENADRINE CITRATE 30 MG/ML IJ SOLN
INTRAMUSCULAR | Status: AC
  Filled 2016-02-27: qty 2

## 2016-02-27 MED ORDER — KETOROLAC TROMETHAMINE 60 MG/2ML IM SOLN
INTRAMUSCULAR | Status: AC
  Filled 2016-02-27: qty 2

## 2016-02-27 NOTE — Patient Instructions (Signed)
Instructed to get xrays and labwork drawn today at the [facility] mall.

## 2016-02-27 NOTE — Progress Notes (Signed)
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 Hypokalemia; Hypomagnesemia (01/04/2014); MI (myocardial infarction) ([place]); and Osteoarthritis. Family: family history includes Heart disease in her mother; Lung cancer in her mother; Ovarian cancer in her mother. Surgical:  has a past surgical history that includes ORIF femur fracture; ORIF tibia & fibula fractures; hemorroids; Ovary surgery; and Cardiac catheterization (Left, 02/22/2016). Tobacco:  reports that she has been smoking Cigarettes.  She has been smoking about 1.00 pack per day. She has never used smokeless tobacco. Alcohol:  reports that she drinks alcohol. Drug:  reports that she uses drugs, including Marijuana. Active Ambulatory Problems    Diagnosis Date Noted  . Non-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/2014 08/02/2015  . Hyperlipidemia 08/02/2015  . Costochondritis 08/02/2015  . COPD (chronic obstructive pulmonary disease) ([place]) 10/09/2015  . GERD (gastroesophageal reflux disease) 10/09/2015  . Hypertension   . Anemia of chronic disease 03/19/2014  . Carotid artery stenosis 02/27/2016  . Chronic constipation 02/27/2016  . Chronic cough 09/04/2011  . Chronic nausea 02/27/2016  . Cigarette nicotine dependence with  nicotine-induced disorder 07/08/2014  . Coronary artery disease 02/27/2016  . 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 GAD (generalized anxiety disorder) 09/03/2014  . Hypercholesterolemia 02/27/2016  . Narcotic abuse, continuous 03/19/2014  . Narcotic dependence ([place]) 09/03/2014  . Osteoarthritis 02/27/2016  . Polysubstance abuse 03/19/2014  . PTSD (post-traumatic stress disorder) 02/27/2016  . PVD (peripheral vascular disease) ([place]) 09/03/2014  . Sleep apnea, obstructive 09/04/2011  . Smoker 09/03/2014  . TIA (transient ischemic attack) 02/27/2016  . Chronic pain 02/27/2016  . Long term current use of opiate analgesic 02/27/2016  . Long term prescription opiate use 02/27/2016  . Opiate use 02/27/2016  . Encounter for therapeutic drug level monitoring 02/27/2016  . Encounter for pain management consult 02/27/2016  . Chronic hip pain (Location of Tertiary source of pain) (Bilateral) (L>R) 02/27/2016  . Chronic knee pain (Bilateral) (L>R) 02/27/2016  . Chronic shoulder pain (Bilateral) (L>R) 02/27/2016  . Chronic sacroiliac joint pain (Bilateral) (L>R) 02/27/2016  . Chronic low back pain (Location of Primary Source of Pain) (Bilateral) (L>R) 02/27/2016  . Chronic  lower extremity pain (Location of Secondary source of pain) (Bilateral) (L>R) 02/27/2016  . Osteoarthritis of hip (Bilateral) (L>R) 02/27/2016  . Chronic neck pain (posterior midline) (Bilateral) (L>R) 02/27/2016  . Cervicogenic headache (Bilateral) (L>R) 02/27/2016  . Occipital headache (Bilateral) (L>R) 02/27/2016  . Chronic upper extremity pain (Bilateral) (L>R) 02/27/2016  . Chronic Cervical radicular pain (Bilateral) (L>R) 02/27/2016  . Chronic lumbar radicular pain (Right) (L5 dermatome) 02/27/2016  . Lumbar facet syndrome (Bilateral) (L>R) 02/27/2016  . Long term prescription benzodiazepine use 02/27/2016   Resolved Ambulatory Problems    Diagnosis Date Noted  . Chest pain 08/02/2015  . Chest pain at rest 08/02/2015  .  Atypical chest pain 08/02/2015  . Hypertensive urgency 10/09/2015  . Hypokalemia   . COPD (chronic obstructive pulmonary disease) with acute bronchitis ([place]) 09/04/2011  . Hypomagnesemia 01/04/2014   Past Medical History:  Diagnosis Date  . Anxiety   . Atypical chest pain 08/02/2015  . CAD (coronary artery disease)   . Chronic back pain   . COPD (chronic obstructive pulmonary disease) ([place])   . Coronary artery disease   . GERD (gastroesophageal reflux disease)   . Hypercholesteremia   . Hypertension   . Hypokalemia   . Hypomagnesemia 01/04/2014  . MI (myocardial infarction) ([place])   . Osteoarthritis     Constitutional Exam  Vitals: Blood pressure 99/69, pulse 69, temperature 98.6 F (37 C), resp. rate 18, height 5\' 4"  (1.626 m), weight 180 lb (81.6 kg), last menstrual period 11/07/2015, SpO2 99 %. General appearance: Well nourished, well developed, and well hydrated. In no acute distress Calculated BMI/Body habitus: Body mass index is 30.9 kg/m. (30-34.9 kg/m2) Obese (Class I) - 68% higher incidence of chronic pain Psych/Mental status: Alert and oriented x 3 (person, place, & time) Eyes: PERLA Respiratory: No evidence of acute respiratory distress  Cervical Spine Exam  Inspection: No masses, redness, or swelling Alignment: Symmetrical Functional ROM: ROM appears unrestricted Stability: No instability detected Muscle strength & Tone: Functionally intact Sensory: Movement-associated pain Palpation: Complains of area being tender to palpation  Upper Extremity (UE) Exam    Side: Right upper extremity  Side: Left upper extremity  Inspection: No masses, redness, swelling, or asymmetry  Inspection: No masses, redness, swelling, or asymmetry  Functional ROM: ROM appears unrestricted          Functional ROM: ROM appears unrestricted          Muscle strength & Tone: Functionally intact  Muscle strength & Tone: Functionally intact  Sensory: Movement-associated discomfort  Sensory:  Movement-associated discomfort  Palpation: Non-contributory  Palpation: Non-contributory   Thoracic Spine Exam  Inspection: No masses, redness, or swelling Alignment: Symmetrical Functional ROM: ROM appears unrestricted Stability: No instability detected Sensory: Unimpaired Muscle strength & Tone: Functionally intact Palpation: Non-contributory  Lumbar Spine Exam  Inspection: No masses, redness, or swelling Alignment: Symmetrical Functional ROM: Decreased ROM Stability: No instability detected Muscle strength & Tone: Functionally intact Sensory: Movement-associated pain Palpation: Complains of area being tender to palpation Provocative Tests: Lumbar Hyperextension and rotation test: Positive bilaterally for facet joint pain. Patrick's Maneuver: Positive for bilateral S-I joint pain and for bilateral hip joint pain.  Gait & Posture Assessment  Ambulation: Unassisted Gait: Antalgic Posture: WNL   Lower Extremity Exam    Side: Right lower extremity  Side: Left lower extremity  Inspection: No masses, redness, swelling, or asymmetry  Inspection: No masses, redness, swelling, or asymmetry  Functional ROM: Decreased ROM for hip joint  Functional ROM: Decreased ROM for  hip joint  Muscle strength & Tone: Able to Toe-walk & Heel-walk without problems  Muscle strength & Tone: Able to Toe-walk & Heel-walk without problems  Sensory: Movement-associated pain  Sensory: Movement-associated pain  Palpation: Non-contributory  Palpation: Non-contributory    Assessment  Primary Diagnosis & Pertinent Problem List: The primary encounter diagnosis was Chronic pain. Diagnoses of Long term current use of opiate analgesic, Long term prescription opiate use, Opiate use, Encounter for therapeutic drug level monitoring, Encounter for pain management consult, Chronic back pain, Chronic back pain greater than 3 months duration, Chronic hip pain, unspecified laterality, Chronic knee pain (Bilateral) (L>R),  Chronic shoulder pain (Bilateral) (L>R), Chronic sacroiliac joint pain (Bilateral) (L>R), Drug abuse, IV, Polysubstance abuse, Chronic low back pain (Location of Primary Source of Pain) (Bilateral) (L>R), Chronic pain of lower extremity, unspecified laterality, Primary osteoarthritis of both hips, Chronic neck pain (posterior midline) (Bilateral) (L>R), Cervicogenic headache (Bilateral) (L>R), Occipital headache (Bilateral) (L>R), Chronic pain of both upper extremities, Chronic Cervical radicular pain, Chronic lumbar radicular pain (Right) (L5 dermatome), Lumbar facet syndrome (Bilateral) (L>R), and Long term prescription benzodiazepine use were also pertinent to this visit.  Visit Diagnosis: 1. Chronic pain   2. Long term current use of opiate analgesic   3. Long term prescription opiate use   4. Opiate use   5. Encounter for therapeutic drug level monitoring   6. Encounter for pain management consult   7. Chronic back pain   8. Chronic back pain greater than 3 months duration   9. Chronic hip pain, unspecified laterality   10. Chronic knee pain (Bilateral) (L>R)   11. Chronic shoulder pain (Bilateral) (L>R)   12. Chronic sacroiliac joint pain (Bilateral) (L>R)   13. Drug abuse, IV   14. Polysubstance abuse   15. Chronic low back pain (Location of Primary Source of Pain) (Bilateral) (L>R)   16. Chronic pain of lower extremity, unspecified laterality   17. Primary osteoarthritis of both hips   18. Chronic neck pain (posterior midline) (Bilateral) (L>R)   19. Cervicogenic headache (Bilateral) (L>R)   20. Occipital headache (Bilateral) (L>R)   21. Chronic pain of both upper extremities   22. Chronic Cervical radicular pain   23. Chronic lumbar radicular pain (Right) (L5 dermatome)   24. Lumbar facet syndrome (Bilateral) (L>R)   25. Long term prescription benzodiazepine use     Assessment: No problem-specific Assessment & Plan notes found for this encounter.   Plan of Care  Initial  Treatment Plan:  Please be advised that as per protocol, today's visit has been an evaluation only. We have not taken over the patient's controlled substance management.  Problem List Items Addressed This Visit      High   Cervicogenic headache (Bilateral) (L>R) (Chronic)   Relevant Medications   HYDROcodone-acetaminophen (NORCO/VICODIN) 5-325 MG tablet   Chronic Cervical radicular pain (Bilateral) (L>R) (Chronic)   Relevant Medications   busPIRone (BUSPAR) 7.5 MG tablet   Chronic hip pain (Location of Tertiary source of pain) (Bilateral) (L>R) (Chronic)   Relevant Medications   HYDROcodone-acetaminophen (NORCO/VICODIN) 5-325 MG tablet   Other Relevant Orders   DG HIP UNILAT W OR W/O PELVIS 2-3 VIEWS LEFT   DG HIP UNILAT W OR W/O PELVIS 2-3 VIEWS RIGHT   Chronic knee pain (Bilateral) (L>R) (Chronic)   Relevant Medications   HYDROcodone-acetaminophen (NORCO/VICODIN) 5-325 MG tablet   Other Relevant Orders   DG Knee 1-2 Views Left   DG Knee 1-2 Views Right   Chronic low back  pain (Location of Primary Source of Pain) (Bilateral) (L>R) (Chronic)   Relevant Medications   HYDROcodone-acetaminophen (NORCO/VICODIN) 5-325 MG tablet   Chronic lower extremity pain (Location of Secondary source of pain) (Bilateral) (L>R) (Chronic)   Chronic lumbar radicular pain (Right) (L5 dermatome) (Chronic)   Chronic neck pain (posterior midline) (Bilateral) (L>R) (Chronic)   Relevant Medications   HYDROcodone-acetaminophen (NORCO/VICODIN) 5-325 MG tablet   Chronic pain - Primary (Chronic)   Relevant Medications   HYDROcodone-acetaminophen (NORCO/VICODIN) 5-325 MG tablet   Other Relevant Orders   C-reactive protein   Sedimentation rate   25-Hydroxyvitamin D Lcms D2+D3   Vitamin B12   Magnesium   Ambulatory referral to Psychology   Chronic sacroiliac joint pain (Bilateral) (L>R) (Chronic)   Relevant Medications   HYDROcodone-acetaminophen (NORCO/VICODIN) 5-325 MG tablet   Other Relevant Orders    DG Si Joints   Chronic shoulder pain (Bilateral) (L>R) (Chronic)   Relevant Orders   DG Shoulder Left   DG Shoulder Right   Chronic upper extremity pain (Bilateral) (L>R) (Chronic)   Relevant Medications   HYDROcodone-acetaminophen (NORCO/VICODIN) 5-325 MG tablet   Lumbar facet syndrome (Bilateral) (L>R) (Chronic)   Relevant Medications   HYDROcodone-acetaminophen (NORCO/VICODIN) 5-325 MG tablet   Occipital headache (Bilateral) (L>R) (Chronic)   Relevant Medications   HYDROcodone-acetaminophen (NORCO/VICODIN) 5-325 MG tablet   Osteoarthritis of hip (Bilateral) (L>R) (Chronic)   Relevant Medications   HYDROcodone-acetaminophen (NORCO/VICODIN) 5-325 MG tablet     Medium   Drug abuse, IV   Relevant Orders   Ambulatory referral to Psychology   Encounter for pain management consult   Encounter for therapeutic drug level monitoring   Long term current use of opiate analgesic (Chronic)   Relevant Orders   Compliance Drug Analysis, Ur   Long term prescription benzodiazepine use (Chronic)   Long term prescription opiate use (Chronic)   Relevant Orders   Ambulatory referral to Psychology   Opiate use (Chronic)   Polysubstance abuse   Relevant Orders   Ambulatory referral to Psychology    Other Visit Diagnoses    Chronic back pain  (Chronic)      Relevant Medications   HYDROcodone-acetaminophen (NORCO/VICODIN) 5-325 MG tablet   Chronic back pain greater than 3 months duration  (Chronic)      Relevant Medications   HYDROcodone-acetaminophen (NORCO/VICODIN) 5-325 MG tablet      Pharmacotherapy (Medications Ordered): Meds ordered this encounter  Medications  . DISCONTD: orphenadrine (NORFLEX) 30 MG/ML injection    TICE, KORI: cabinet override  . DISCONTD: ketorolac (TORADOL) 60 MG/2ML injection    TICE, KORI: cabinet override    Lab-work & Procedure Ordered: Orders Placed This Encounter  Procedures  . DG HIP UNILAT W OR W/O PELVIS 2-3 VIEWS LEFT  . DG HIP UNILAT W OR W/O  PELVIS 2-3 VIEWS RIGHT  . DG Shoulder Left  . DG Shoulder Right  . DG Knee 1-2 Views Left  . DG Knee 1-2 Views Right  . DG Si Joints  . Compliance Drug Analysis, Ur  . C-reactive protein  . Sedimentation rate  . 25-Hydroxyvitamin D Lcms D2+D3  . Vitamin B12  . Magnesium  . Ambulatory referral to Psychology    Interventional Therapies: Scheduled:  None at this time.    Considering:   Diagnostic bilateral lumbar facet block under fluoroscopic guidance and IV sedation.  Possible bilateral lumbar facet radiofrequency ablation under fluoroscopic guidance and IV sedation.  Diagnostic bilateral sacroiliac joint block under fluoroscopic guidance, with or without sedation.  Possible  bilateral sacroiliac joint radiofrequency ablation under fluoroscopic guidance and IV sedation.  Diagnostic right-sided L4-5 interlaminar lumbar epidural steroid injection under fluoroscopic guidance, with or without sedation.  Diagnostic right-sided L5-S1 transforaminal epidural steroid injection under fluoroscopic guidance, with or without sedation.  Diagnostic bilateral intra-articular hip joint injection under fluoroscopic guidance, with or without sedation.  Diagnostic bilateral up to date her and femoral nerve articular branch blocks under fluoroscopic guidance, with or without sedation.  Possible bilateral hip joint radiofrequency ablation under fluoroscopic guidance and IV sedation.  Diagnostic bilateral cervical facet block under fluoroscopic guidance and IV sedation.  Possible bilateral cervical facet radiofrequency ablation under fluoroscopic guidance and IV sedation.  Diagnostic left-sided cervical epidural steroid injection under fluoroscopic guidance, with or without sedation.  Diagnostic bilateral intra-articular shoulder joint injection under fluoroscopic guidance, with or without sedation.  Diagnostic bilateral suprascapular nerve block under fluoroscopic guidance and IV sedation.  Possible  bilateral suprascapular nerve radiofrequency ablation under fluoroscopic guidance and IV sedation.  Diagnostic bilateral intra-articular knee joint injection with local anesthetic and steroids without fluoroscopy or IV sedation.  Possible series of 5 intra-articular Hyalgan knee injections without fluoroscopy or IV sedation.  Diagnostic bilateral genicular nerve blocks under fluoroscopic guidance and IV sedation.  Possible bilateral genicular nerve radiofrequency ablation under fluoroscopic guidance and IV sedation.  Diagnostic bilateral greater occipital nerve blocks under fluoroscopic guidance, with or without sedation.  Possible bilateral greater occipital nerve radiofrequency ablation under fluoroscopic guidance and IV sedation.    PRN Procedures:  None at this time.    Referral(s) or Consult(s): Medical psychology consult for substance use disorder evaluation  Medications administered during this visit: Ms. Krouse had no medications administered during this visit.  Prescriptions ordered during this visit: New Prescriptions   No medications on file    Requested PM Follow-up: Return for 2nd Visit Eval, After [facility] Eval.  No future appointments.   Primary Care Physician: [place] Location: [facility] Note by: Kathlen Brunswick. Dossie Arbour, M.D, DABA, DABAPM, DABPM, DABIPP, FIPP  Pain Score Disclaimer: We use the NRS-11 scale. This is a self-reported, subjective measurement of pain severity with only modest accuracy. It is used primarily to identify changes within a particular patient. It must be understood that outpatient pain scales are significantly less accurate that those used for research, where they can be applied under ideal controlled circumstances with minimal exposure to variables. In reality, the score is likely to be a combination of pain intensity and pain affect, where pain affect describes the degree of emotional arousal or  changes in action readiness caused by the sensory experience of pain. Factors such as social and work situation, setting, emotional state, anxiety levels, expectation, and prior pain experience may influence pain perception and show large inter-individual differences that may also be affected by time variables.  Patient instructions provided during this appointment: Patient Instructions  Instructed to get xrays and labwork drawn today at the medical mall.

## 2016-03-01 ENCOUNTER — Other Ambulatory Visit: Payer: Self-pay

## 2016-03-01 ENCOUNTER — Emergency Department
Admission: EM | Admit: 2016-03-01 | Discharge: 2016-03-01 | Disposition: A | Discharge status: Home / Self Care | Payer: Medicaid Other | Attending: Emergency Medicine | Admitting: Emergency Medicine

## 2016-03-01 ENCOUNTER — Ambulatory Visit
Admission: RE | Admit: 2016-03-01 | Discharge: 2016-03-01 | Disposition: A | Discharge status: Home / Self Care | Payer: Medicaid Other | Source: Ambulatory Visit | Attending: Pain Medicine | Admitting: Pain Medicine

## 2016-03-01 ENCOUNTER — Other Ambulatory Visit
Admission: RE | Admit: 2016-03-01 | Discharge: 2016-03-01 | Disposition: A | Discharge status: Home / Self Care | Payer: Medicaid Other | Source: Ambulatory Visit | Attending: Pain Medicine | Admitting: Pain Medicine

## 2016-03-01 ENCOUNTER — Emergency Department: Payer: Medicaid Other

## 2016-03-01 DIAGNOSIS — I10 Essential (primary) hypertension: Secondary | ICD-10-CM | POA: Insufficient documentation

## 2016-03-01 DIAGNOSIS — M25512 Pain in left shoulder: Secondary | ICD-10-CM | POA: Diagnosis not present

## 2016-03-01 DIAGNOSIS — J449 Chronic obstructive pulmonary disease, unspecified: Secondary | ICD-10-CM | POA: Diagnosis not present

## 2016-03-01 DIAGNOSIS — F17210 Nicotine dependence, cigarettes, uncomplicated: Secondary | ICD-10-CM | POA: Diagnosis not present

## 2016-03-01 DIAGNOSIS — M25561 Pain in right knee: Secondary | ICD-10-CM

## 2016-03-01 DIAGNOSIS — I252 Old myocardial infarction: Secondary | ICD-10-CM | POA: Diagnosis not present

## 2016-03-01 DIAGNOSIS — M533 Sacrococcygeal disorders, not elsewhere classified: Secondary | ICD-10-CM

## 2016-03-01 DIAGNOSIS — R05 Cough: Secondary | ICD-10-CM | POA: Diagnosis present

## 2016-03-01 DIAGNOSIS — M25562 Pain in left knee: Secondary | ICD-10-CM | POA: Insufficient documentation

## 2016-03-01 DIAGNOSIS — M25511 Pain in right shoulder: Secondary | ICD-10-CM

## 2016-03-01 DIAGNOSIS — J40 Bronchitis, not specified as acute or chronic: Secondary | ICD-10-CM

## 2016-03-01 DIAGNOSIS — Z7982 Long term (current) use of aspirin: Secondary | ICD-10-CM | POA: Diagnosis not present

## 2016-03-01 DIAGNOSIS — Z79899 Other long term (current) drug therapy: Secondary | ICD-10-CM | POA: Diagnosis not present

## 2016-03-01 DIAGNOSIS — I2510 Atherosclerotic heart disease of native coronary artery without angina pectoris: Secondary | ICD-10-CM | POA: Insufficient documentation

## 2016-03-01 DIAGNOSIS — M25559 Pain in unspecified hip: Principal | ICD-10-CM

## 2016-03-01 DIAGNOSIS — Z8673 Personal history of transient ischemic attack (TIA), and cerebral infarction without residual deficits: Secondary | ICD-10-CM | POA: Diagnosis not present

## 2016-03-01 DIAGNOSIS — M25552 Pain in left hip: Secondary | ICD-10-CM | POA: Diagnosis not present

## 2016-03-01 DIAGNOSIS — G8929 Other chronic pain: Secondary | ICD-10-CM | POA: Diagnosis present

## 2016-03-01 LAB — CBC
HCT: 43.2 % (ref 35.0–47.0)
Hemoglobin: 14.7 g/dL (ref 12.0–16.0)
MCH: 29.8 pg (ref 26.0–34.0)
MCHC: 33.9 g/dL (ref 32.0–36.0)
MCV: 87.7 fL (ref 80.0–100.0)
Platelets: 147 10*3/uL — ABNORMAL LOW (ref 150–440)
RBC: 4.93 MIL/uL (ref 3.80–5.20)
RDW: 17.1 % — ABNORMAL HIGH (ref 11.5–14.5)
WBC: 13.4 10*3/uL — ABNORMAL HIGH (ref 3.6–11.0)

## 2016-03-01 LAB — BASIC METABOLIC PANEL
Anion gap: 9 (ref 5–15)
BUN: 10 mg/dL (ref 6–20)
CO2: 23 mmol/L (ref 22–32)
Calcium: 9.4 mg/dL (ref 8.9–10.3)
Chloride: 104 mmol/L (ref 101–111)
Creatinine, Ser: 0.99 mg/dL (ref 0.44–1.00)
GFR calc Af Amer: >60 mL/min (ref >60)
GFR calc non Af Amer: >60 mL/min (ref >60)
Glucose, Bld: 82 mg/dL (ref 65–99)
Potassium: 4.3 mmol/L (ref 3.5–5.1)
Sodium: 136 mmol/L (ref 135–145)

## 2016-03-01 LAB — C-REACTIVE PROTEIN: CRP: 1.7 mg/dL — ABNORMAL HIGH (ref <1.0)

## 2016-03-01 LAB — SEDIMENTATION RATE: Sed Rate: 7 mm/hr (ref 0–20)

## 2016-03-01 LAB — VITAMIN B12: Vitamin B-12: 491 pg/mL (ref 180–914)

## 2016-03-01 LAB — TROPONIN I: Troponin I: <0.03 ng/mL (ref <0.03)

## 2016-03-01 LAB — MAGNESIUM: Magnesium: 1.6 mg/dL — ABNORMAL LOW (ref 1.7–2.4)

## 2016-03-01 MED ORDER — AZITHROMYCIN 250 MG PO TABS: ORAL_TABLET | ORAL | 0 refills | Status: AC

## 2016-03-01 MED ORDER — AZITHROMYCIN 250 MG PO TABS
500.0000 mg | ORAL_TABLET | Freq: Once | ORAL | Status: AC
  Administered 2016-03-01: 500 mg via ORAL
  Filled 2016-03-01: qty 2

## 2016-03-01 MED ORDER — PREDNISONE 20 MG PO TABS: 60.0000 mg | ORAL_TABLET | Freq: Every day | ORAL | 0 refills | Status: DC

## 2016-03-01 MED ORDER — PREDNISONE 20 MG PO TABS
60.0000 mg | ORAL_TABLET | Freq: Once | ORAL | Status: AC
Start: 2016-03-01 — End: 2016-03-01
  Administered 2016-03-01: 60 mg via ORAL
  Filled 2016-03-01: qty 3

## 2016-03-01 NOTE — ED Triage Notes (Signed)
CP X 1 week, productive yellow cough, pain with cough only. Pt alert and oriented X4, active, cooperative, pt in NAD. RR even and unlabored, color WNL.

## 2016-03-01 NOTE — ED Provider Notes (Signed)
[facility] Emergency Department Provider Note   ____________________________________________   First MD Initiated Contact with Patient 03/01/16 1617     (approximate)  I have reviewed the triage vital signs and the nursing notes.   HISTORY  Chief Complaint Chest Pain and Cough   HPI Sara Munoz is a 50 y.o. female with a history of atypical chest pain as well as CAD and COPD who is presenting to the emergency department today with cough, runny nose as well as frontal chest pain with the cough over the past week. She says that she is sick contact of her boyfriend who also has a cough. However, she says that her cough has become severe. She only has pain when she coughs. The pain is frontal to the chest. There is no radiation of the pain. Denies any fever. Says is been taking Mucinex at home without any relief. Patient says that the cough has been productive of yellow sputum. Says that she is also been nauseous but without any vomiting. Says that she still smokes.   Past Medical History:  Diagnosis Date  . Anxiety   . Atypical chest pain 08/02/2015  . CAD (coronary artery disease)   . Chronic back pain   . COPD (chronic obstructive pulmonary disease) ([place])   . Coronary artery disease    a. Mild-non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/2014  . GERD (gastroesophageal reflux disease)   . Hypercholesteremia   . Hypertension   . Hypokalemia   . Hypomagnesemia 01/04/2014  . MI (myocardial infarction) ([place])   . Osteoarthritis     Patient Active Problem List   Diagnosis Date Noted  . Carotid artery stenosis 02/27/2016  . Chronic constipation 02/27/2016  . Chronic nausea 02/27/2016  . Coronary artery disease 02/27/2016  . Hypercholesterolemia 02/27/2016  . Osteoarthritis 02/27/2016  . PTSD (post-traumatic stress disorder) 02/27/2016  . TIA (transient ischemic attack) 02/27/2016  . Chronic pain 02/27/2016  . Long term current use of opiate analgesic  02/27/2016  . Long term prescription opiate use 02/27/2016  . Opiate use 02/27/2016  . Encounter for therapeutic drug level monitoring 02/27/2016  . Encounter for pain management consult 02/27/2016  . Chronic hip pain (Location of Tertiary source of pain) (Bilateral) (L>R) 02/27/2016  . Chronic knee pain (Bilateral) (L>R) 02/27/2016  . Chronic shoulder pain (Bilateral) (L>R) 02/27/2016  . Chronic sacroiliac joint pain (Bilateral) (L>R) 02/27/2016  . Chronic low back pain (Location of Primary Source of Pain) (Bilateral) (L>R) 02/27/2016  . Chronic lower extremity pain (Location of Secondary source of pain) (Bilateral) (L>R) 02/27/2016  . Osteoarthritis of hip (Bilateral) (L>R) 02/27/2016  . Chronic neck pain (posterior midline) (Bilateral) (L>R) 02/27/2016  . Cervicogenic headache (Bilateral) (L>R) 02/27/2016  . Occipital headache (Bilateral) (L>R) 02/27/2016  . Chronic upper extremity pain (Bilateral) (L>R) 02/27/2016  . Chronic Cervical radicular pain (Bilateral) (L>R) 02/27/2016  . Chronic lumbar radicular pain (Right) (L5 dermatome) 02/27/2016  . Lumbar facet syndrome (Bilateral) (L>R) 02/27/2016  . Long term prescription benzodiazepine use 02/27/2016  . Hypertension   . COPD (chronic obstructive pulmonary disease) ([place]) 10/09/2015  . GERD (gastroesophageal reflux disease) 10/09/2015  . Non-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/2014 08/02/2015  . Hyperlipidemia 08/02/2015  . Costochondritis 08/02/2015  . 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 GAD (generalized anxiety disorder) 09/03/2014  . Narcotic dependence ([place]) 09/03/2014  . PVD (peripheral vascular disease) ([place]) 09/03/2014  . Smoker 09/03/2014  . Cigarette nicotine dependence with nicotine-induced disorder 07/08/2014  . Anemia of  chronic disease 03/19/2014  . Narcotic abuse, continuous 03/19/2014  . Polysubstance abuse 03/19/2014  . Chronic cough 09/04/2011  . Sleep apnea, obstructive 09/04/2011    Past Surgical History:  Procedure  Laterality Date  . CARDIAC CATHETERIZATION Left 02/22/2016   Procedure: Left Heart Cath and Coronary Angiography;  Surgeon: Yolonda Kida, MD;  Location: [place] [place];  Service: Cardiovascular;  Laterality: Left;  . hemorroids    . ORIF FEMUR FRACTURE    . ORIF TIBIA & FIBULA FRACTURES    . OVARY SURGERY      Prior to Admission medications   Medication Sig Start Date End Date Taking? Authorizing Provider  albuterol (PROAIR HFA) 108 (90 Base) MCG/ACT inhaler INHALE 2 PUFFS BY MOUTH EVERY 6 HOURS AS NEEDED 01/25/15   Historical Provider, MD  albuterol (PROVENTIL HFA;VENTOLIN HFA) 108 (90 Base) MCG/ACT inhaler Inhale 2 puffs into the lungs every 4 (four) hours as needed for wheezing or shortness of breath. 08/03/15   Demetrios Loll, MD  aspirin EC 81 MG tablet Take 81 mg by mouth daily.    Historical Provider, MD  atorvastatin (LIPITOR) 20 MG tablet Take 20 mg by mouth at bedtime.    Historical Provider, MD  budesonide-formoterol (SYMBICORT) 160-4.5 MCG/ACT inhaler Inhale 2 puffs into the lungs 2 (two) times daily. 05/31/15   Gregor Hams, MD  busPIRone (BUSPAR) 7.5 MG tablet Take 7.5 mg by mouth 2 (two) times daily.    Historical Provider, MD  dexlansoprazole (DEXILANT) 60 MG capsule Take 60 mg by mouth daily.    Historical Provider, MD  docusate sodium (COLACE) 100 MG capsule Take 200 mg by mouth 2 (two) times daily as needed for mild constipation.    Historical Provider, MD  gabapentin (NEURONTIN) 300 MG capsule Take 2 capsules (600 mg total) by mouth 3 (three) times daily. 08/03/15   Demetrios Loll, MD  HYDROcodone-acetaminophen (NORCO/VICODIN) 5-325 MG tablet TK 1 T PO Q 4 H PRF MODERATE PAIN 01/05/16   Historical Provider, MD  linaclotide (LINZESS) 290 MCG CAPS capsule Take 290 mcg by mouth daily before breakfast.    Historical Provider, MD  lisinopril (PRINIVIL,ZESTRIL) 20 MG tablet Take 20 mg by mouth daily.    Historical Provider, MD  metoprolol tartrate (LOPRESSOR) 25 MG tablet Take 1  tablet (25 mg total) by mouth 2 (two) times daily. 08/03/15   Demetrios Loll, MD  nitroGLYCERIN (NITROSTAT) 0.4 MG SL tablet Place 1 tablet (0.4 mg total) under the tongue every 5 (five) minutes x 3 doses as needed for chest pain. 08/03/15   Demetrios Loll, MD  RANEXA 500 MG 12 hr tablet Take 1 tablet (500 mg total) by mouth 2 (two) times daily. 08/03/15   Demetrios Loll, MD  tiZANidine (ZANAFLEX) 4 MG tablet Take 2 tablets (8 mg total) by mouth 3 (three) times daily. Patient taking differently: Take 4 mg by mouth 3 (three) times daily.  02/05/16   Victorino Dike, FNP    Allergies Levofloxacin; Flexeril [cyclobenzaprine]; Keflex [cephalexin]; Ketorolac; Toradol [ketorolac tromethamine]; Tramadol; and Zoloft [sertraline hcl]  Family History  Problem Relation Age of Onset  . Heart disease Mother   . Lung cancer Mother   . Ovarian cancer Mother     Social History Social History  Substance Use Topics  . Smoking status: Current Every Day Smoker    Packs/day: 1.00    Types: Cigarettes  . Smokeless tobacco: Never Used  . Alcohol use 0.0 oz/week     Comment: drinks occasionally  Review of Systems Constitutional: No fever/chills Eyes: No visual changes. ENT: No sore throat. Cardiovascular: As above Respiratory: Cough productive of yellow sputum. Gastrointestinal: No abdominal pain.   no vomiting.  No diarrhea.  No constipation. Genitourinary: Negative for dysuria. Musculoskeletal: Negative for back pain. Skin: Negative for rash. Neurological: Negative for headaches, focal weakness or numbness.  10-point ROS otherwise negative.  ____________________________________________   PHYSICAL EXAM:  VITAL SIGNS: ED Triage Vitals  Enc Vitals Group     BP 03/01/16 1537 102/70     Pulse Rate 03/01/16 1536 71     Resp 03/01/16 1536 18     Temp 03/01/16 1536 98 F (36.7 C)     Temp Source 03/01/16 1536 Oral     SpO2 03/01/16 1536 100 %     Weight 03/01/16 1532 180 lb (81.6 kg)     Height 03/01/16 1532  5\' 4"  (1.626 m)     Head Circumference --      Peak Flow --      Pain Score 03/01/16 1532 8     Pain Loc --      Pain Edu? --      Excl. in [place]? --     Constitutional: Alert and oriented. Well appearing and in no acute distress.Coughs multiple times about the exam. Eyes: Conjunctivae are normal. PERRL. EOMI. Head: Atraumatic. Nose: Mild rhinorrhea to the bilateral nares. Mouth/Throat: Mucous membranes are moist.  Oropharynx non-erythematous. Neck: No stridor.   ardiovascular: Normal rate, regular rhythm. Grossly normal heart sounds.  Good peripheral circulation. Respiratory: Normal respiratory effort.  No retractions. Lungs CTAB. Prolonged expiratory phase. Gastrointestinal: Soft and nontender. No distention.  Musculoskeletal: No lower extremity tenderness nor edema.  No joint effusions. Neurologic:  Normal speech and language. No gross focal neurologic deficits are appreciated. Skin:  Skin is warm, dry and intact. No rash noted. Psychiatric: Mood and affect are normal. Speech and behavior are normal.  ____________________________________________   LABS (all labs ordered are listed, but only abnormal results are displayed)  Labs Reviewed  CBC - Abnormal; Notable for the following:       Result Value   WBC 13.4 (*)    RDW 17.1 (*)    Platelets 147 (*)    All other components within normal limits  BASIC METABOLIC PANEL  TROPONIN I   ____________________________________________  EKG  ED ECG REPORT I, Doran Stabler, the attending physician, personally viewed and interpreted this ECG.   Date: 03/01/2016  EKG Time: 1535  Rate: 70  Rhythm: normal sinus rhythm  Axis: Normal  Intervals:none  ST&T Change: No ST segment elevation or depression. No abnormal T-wave inversion.  ____________________________________________  RADIOLOGY  DG Chest 2 View (Accession [id number]) (Order [id number])  Imaging  Date: 03/01/2016 Department: [place] Released By: Joaquin Music, RN (auto-released) Authorizing: Orbie Pyo, MD  PACS Images   Show images for DG Chest 2 View  Study Result   CLINICAL DATA:  Chest pain, shortness of breath  EXAM: CHEST  2 VIEW  COMPARISON:  01/11/2016  FINDINGS: The heart size and mediastinal contours are within normal limits. Both lungs are clear. The visualized skeletal structures are unremarkable.  IMPRESSION: No active cardiopulmonary disease.   Electronically Signed   By: Kathreen Devoid   On: 03/01/2016 16:16     ____________________________________________   PROCEDURES  Procedure(s) performed:   Procedures  Critical Care performed:   ____________________________________________   INITIAL IMPRESSION / ASSESSMENT AND  PLAN / ED COURSE  Pertinent labs & imaging results that were available during my care of the patient were reviewed by me and considered in my medical decision making (see chart for details).  Patient actively coughing in the room. Very reassuring workup including her labs, EKG as well as chest x-ray. Likely costochondritis and chest wall pain from her cough. Because she has has a history of COPD as well as a prolonged expiratory phase on exam I will be prescribing her steroids as well as azithromycin. I advised her to continue with over-the-counter pain control as well as cough medicine. She is understanding the plan and willing to comply. She also says that she has an inhaler as well as nebulizer machine at home.  Clinical Course     ____________________________________________   FINAL CLINICAL IMPRESSION(S) / ED DIAGNOSES  Bronchitis.    NEW MEDICATIONS STARTED DURING THIS VISIT:  New Prescriptions   No medications on file     Note:  This document was prepared using Dragon voice recognition software and may include unintentional dictation errors.    Orbie Pyo, MD 03/01/16 [phone]

## 2016-03-01 NOTE — ED Notes (Signed)

## 2016-03-03 ENCOUNTER — Encounter: Payer: Self-pay | Admitting: Pain Medicine

## 2016-03-03 ENCOUNTER — Other Ambulatory Visit: Payer: Self-pay | Admitting: Pain Medicine

## 2016-03-03 MED ORDER — MAGNESIUM OXIDE -MG SUPPLEMENT 500 MG PO CAPS
1.0000 | ORAL_CAPSULE | Freq: Two times a day (BID) | ORAL | 99 refills | Status: DC
Start: 2016-03-03 — End: 2016-04-24

## 2016-03-03 NOTE — Progress Notes (Signed)
Normal Magnesium levels are between 1.6 and 2.3 mEq/L. Low magnesium blood level can lead to low calcium and potassium levels. Low levels may indicate inadequate dietary consuming, poor absorbtion, or excessive excretion. Signs and symptoms may include: leg cramps, foot pain, muscle twitches, loss of appetite, nausea, vomiting, fatigue, weakness, numbness, tingling, seizures, personality changes, abnormal heart rhythms, and/or coronary artery spasms.

## 2016-03-03 NOTE — Progress Notes (Signed)
Normal levels of C-Reactive Protein for our Lab are less than 1.0 mg/L. C-reactive protein (CRP) is produced by the liver. The level of CRP rises when there is inflammation throughout the body. CRP goes up in response to inflammation. High levels suggests the presence of chronic inflammation but do not identify its location or cause. High levels have been observed in obese patients, individuals with bacterial infections, chronic inflammation, or flare-ups of inflammatory conditions. Drops of previously elevated levels suggest that the inflammation or infection is subsiding and/or responding to treatment.

## 2016-03-04 LAB — 25-HYDROXY VITAMIN D LCMS D2+D3
25-Hydroxy, Vitamin D-2: <1 ng/mL
25-Hydroxy, Vitamin D-3: 32 ng/mL
25-Hydroxy, Vitamin D: 32 ng/mL

## 2016-03-06 ENCOUNTER — Telehealth: Payer: Self-pay

## 2016-03-06 LAB — COMPLIANCE DRUG ANALYSIS, UR

## 2016-03-06 NOTE — Telephone Encounter (Signed)
The patient called asking when Dr. Marjory Lies was going to call her with appt. I investigated and found that the patient has medicaid. Dr. Marjory Lies does not take medicaid patients. What does she need to do from here since she cant go see Dr. Marjory Lies?

## 2016-03-21 NOTE — Progress Notes (Signed)
NOTE: This forensic urine drug screen (UDS) test was conducted using a state-of-the-art ultra high performance liquid chromatography and mass spectrometry system (UPLC/MS-MS), the most sophisticated and accurate method available. UPLC/MS-MS is 1,000 times more precise and accurate than standard gas chromatography and mass spectrometry (GC/MS). This system can analyze 26 drug categories and 180 drug compounds.  This patient's UDT results were (+) for Cannabinoids. The patient has been informed of our "Zero Tolerance" towards the use of any illegal substances. Because the patient did disclose the use of cannabinoids at the time of collection, we may not discontinue the posibility of using of controlled substances, but we will limit them contingent on the patient's discontinuation of all illegal substances. We will also limit the interval between refills to no more that 30 days, with frequent unanounced UDTs. As long as the quantitative levels are seen to be decreasing, we will continue therapy. The minute they increase or remain the same, we will discontinue the opioid management option.

## 2016-03-27 ENCOUNTER — Other Ambulatory Visit: Payer: Self-pay | Admitting: Physician Assistant

## 2016-03-27 ENCOUNTER — Ambulatory Visit
Admission: RE | Admit: 2016-03-27 | Discharge: 2016-03-27 | Disposition: A | Discharge status: Home / Self Care | Payer: Medicaid Other | Source: Ambulatory Visit | Attending: Physician Assistant | Admitting: Physician Assistant

## 2016-03-27 DIAGNOSIS — I700 Atherosclerosis of aorta: Secondary | ICD-10-CM | POA: Diagnosis not present

## 2016-03-27 DIAGNOSIS — I2510 Atherosclerotic heart disease of native coronary artery without angina pectoris: Secondary | ICD-10-CM | POA: Diagnosis not present

## 2016-03-27 DIAGNOSIS — R079 Chest pain, unspecified: Secondary | ICD-10-CM

## 2016-03-27 DIAGNOSIS — K760 Fatty (change of) liver, not elsewhere classified: Secondary | ICD-10-CM | POA: Insufficient documentation

## 2016-03-27 MED ORDER — IOPAMIDOL (ISOVUE-370) INJECTION 76%
75.0000 mL | Freq: Once | INTRAVENOUS | Status: AC | PRN
  Administered 2016-03-27: 75 mL via INTRAVENOUS

## 2016-03-29 ENCOUNTER — Other Ambulatory Visit: Payer: Self-pay | Admitting: Physician Assistant

## 2016-03-29 DIAGNOSIS — N644 Mastodynia: Secondary | ICD-10-CM

## 2016-04-11 ENCOUNTER — Ambulatory Visit
Admission: RE | Admit: 2016-04-11 | Discharge: 2016-04-11 | Disposition: A | Discharge status: Home / Self Care | Payer: Self-pay | Source: Ambulatory Visit | Attending: *Deleted | Admitting: *Deleted

## 2016-04-11 ENCOUNTER — Other Ambulatory Visit: Payer: Self-pay | Admitting: *Deleted

## 2016-04-11 DIAGNOSIS — Z9289 Personal history of other medical treatment: Secondary | ICD-10-CM

## 2016-04-16 ENCOUNTER — Ambulatory Visit: Payer: Medicaid Other

## 2016-04-19 ENCOUNTER — Telehealth: Payer: Self-pay | Admitting: Gastroenterology

## 2016-04-19 NOTE — Telephone Encounter (Signed)
Called Beth at [facility] to let her know that I tried to call patient but cell phone was not working and she is not taking calls on the home phone. Patient needs to schedule with GI for Hep C.

## 2016-04-24 ENCOUNTER — Ambulatory Visit: Payer: Medicaid Other | Attending: Pain Medicine | Admitting: Pain Medicine

## 2016-04-24 ENCOUNTER — Encounter: Payer: Self-pay | Admitting: Pain Medicine

## 2016-04-24 VITALS — BP 180/91 | HR 64 | Temp 98.4°F | Resp 16 | Ht 64.0 in | Wt 196.0 lb

## 2016-04-24 DIAGNOSIS — F411 Generalized anxiety disorder: Secondary | ICD-10-CM | POA: Insufficient documentation

## 2016-04-24 DIAGNOSIS — E7800 Pure hypercholesterolemia, unspecified: Secondary | ICD-10-CM | POA: Diagnosis not present

## 2016-04-24 DIAGNOSIS — F17210 Nicotine dependence, cigarettes, uncomplicated: Secondary | ICD-10-CM | POA: Diagnosis not present

## 2016-04-24 DIAGNOSIS — M25511 Pain in right shoulder: Secondary | ICD-10-CM | POA: Diagnosis not present

## 2016-04-24 DIAGNOSIS — M79605 Pain in left leg: Secondary | ICD-10-CM | POA: Insufficient documentation

## 2016-04-24 DIAGNOSIS — K219 Gastro-esophageal reflux disease without esophagitis: Secondary | ICD-10-CM | POA: Diagnosis not present

## 2016-04-24 DIAGNOSIS — I252 Old myocardial infarction: Secondary | ICD-10-CM | POA: Diagnosis not present

## 2016-04-24 DIAGNOSIS — F4310 Post-traumatic stress disorder, unspecified: Secondary | ICD-10-CM | POA: Diagnosis not present

## 2016-04-24 DIAGNOSIS — M545 Low back pain, unspecified: Secondary | ICD-10-CM

## 2016-04-24 DIAGNOSIS — I739 Peripheral vascular disease, unspecified: Secondary | ICD-10-CM | POA: Diagnosis not present

## 2016-04-24 DIAGNOSIS — J449 Chronic obstructive pulmonary disease, unspecified: Secondary | ICD-10-CM | POA: Insufficient documentation

## 2016-04-24 DIAGNOSIS — Z79891 Long term (current) use of opiate analgesic: Secondary | ICD-10-CM | POA: Insufficient documentation

## 2016-04-24 DIAGNOSIS — G4733 Obstructive sleep apnea (adult) (pediatric): Secondary | ICD-10-CM | POA: Insufficient documentation

## 2016-04-24 DIAGNOSIS — E876 Hypokalemia: Secondary | ICD-10-CM | POA: Insufficient documentation

## 2016-04-24 DIAGNOSIS — D638 Anemia in other chronic diseases classified elsewhere: Secondary | ICD-10-CM | POA: Insufficient documentation

## 2016-04-24 DIAGNOSIS — M79604 Pain in right leg: Secondary | ICD-10-CM | POA: Diagnosis not present

## 2016-04-24 DIAGNOSIS — I10 Essential (primary) hypertension: Secondary | ICD-10-CM | POA: Insufficient documentation

## 2016-04-24 DIAGNOSIS — F1190 Opioid use, unspecified, uncomplicated: Secondary | ICD-10-CM

## 2016-04-24 DIAGNOSIS — G8929 Other chronic pain: Secondary | ICD-10-CM | POA: Diagnosis present

## 2016-04-24 DIAGNOSIS — I2510 Atherosclerotic heart disease of native coronary artery without angina pectoris: Secondary | ICD-10-CM | POA: Insufficient documentation

## 2016-04-24 DIAGNOSIS — M25512 Pain in left shoulder: Secondary | ICD-10-CM | POA: Insufficient documentation

## 2016-04-24 DIAGNOSIS — F1910 Other psychoactive substance abuse, uncomplicated: Secondary | ICD-10-CM | POA: Diagnosis not present

## 2016-04-24 MED ORDER — MAGNESIUM OXIDE -MG SUPPLEMENT 500 MG PO CAPS: 1.0000 | ORAL_CAPSULE | Freq: Two times a day (BID) | ORAL | 99 refills | Status: DC

## 2016-04-24 NOTE — Patient Instructions (Addendum)
Steps to Quit Smoking  Smoking tobacco can be harmful to your health and can affect almost every organ in your body. Smoking puts you, and those around you, at risk for developing many serious chronic diseases. Quitting smoking is difficult, but it is one of the best things that you can do for your health. It is never too late to quit. WHAT ARE THE BENEFITS OF QUITTING SMOKING? When you quit smoking, you lower your risk of developing serious diseases and conditions, such as:  Lung cancer or lung disease, such as COPD.  Heart disease.  Stroke.  Heart attack.  Infertility.  Osteoporosis and bone fractures. Additionally, symptoms such as coughing, wheezing, and shortness of breath may get better when you quit. You may also find that you get sick less often because your body is stronger at fighting off colds and infections. If you are pregnant, quitting smoking can help to reduce your chances of having a baby of low birth weight. HOW DO I GET READY TO QUIT? When you decide to quit smoking, create a plan to make sure that you are successful. Before you quit:  Pick a date to quit. Set a date within the next two weeks to give you time to prepare.  Write down the reasons why you are quitting. Keep this list in places where you will see it often, such as on your bathroom mirror or in your car or wallet.  Identify the people, places, things, and activities that make you want to smoke (triggers) and avoid them. Make sure to take these actions:  Throw away all cigarettes at home, at work, and in your car.  Throw away smoking accessories, such as ashtrays and lighters.  Clean your car and make sure to empty the ashtray.  Clean your home, including curtains and carpets.  Tell your family, friends, and coworkers that you are quitting. Support from your loved ones can make quitting easier.  Talk with your health care provider about your options for quitting smoking.  Find out what treatment  options are covered by your health insurance. WHAT STRATEGIES CAN I USE TO QUIT SMOKING?  Talk with your healthcare provider about different strategies to quit smoking. Some strategies include:  Quitting smoking altogether instead of gradually lessening how much you smoke over a period of time. Research shows that quitting "cold turkey" is more successful than gradually quitting.  Attending in-person counseling to help you build problem-solving skills. You are more likely to have success in quitting if you attend several counseling sessions. Even short sessions of 10 minutes can be effective.  Finding resources and support systems that can help you to quit smoking and remain smoke-free after you quit. These resources are most helpful when you use them often. They can include:  Online chats with a counselor.  Telephone quitlines.  Printed self-help materials.  Support groups or group counseling.  Text messaging programs.  Mobile phone applications.  Taking medicines to help you quit smoking. (If you are pregnant or breastfeeding, talk with your health care provider first.) Some medicines contain nicotine and some do not. Both types of medicines help with cravings, but the medicines that include nicotine help to relieve withdrawal symptoms. Your health care provider may recommend:  Nicotine patches, gum, or lozenges.  Nicotine inhalers or sprays.  Non-nicotine medicine that is taken by mouth. Talk with your health care provider about combining strategies, such as taking medicines while you are also receiving in-person counseling. Using these two strategies together makes   you more likely to succeed in quitting than if you used either strategy on its own. If you are pregnant or breastfeeding, talk with your health care provider about finding counseling or other support strategies to quit smoking. Do not take medicine to help you quit smoking unless told to do so by your health care  provider. WHAT THINGS CAN I DO TO MAKE IT EASIER TO QUIT? Quitting smoking might feel overwhelming at first, but there is a lot that you can do to make it easier. Take these important actions:  Reach out to your family and friends and ask that they support and encourage you during this time. Call telephone quitlines, reach out to support groups, or work with a counselor for support.  Ask people who smoke to avoid smoking around you.  Avoid places that trigger you to smoke, such as bars, parties, or smoke-break areas at work.  Spend time around people who do not smoke.  Lessen stress in your life, because stress can be a smoking trigger for some people. To lessen stress, try:  Exercising regularly.  Deep-breathing exercises.  Yoga.  Meditating.  Performing a body scan. This involves closing your eyes, scanning your body from head to toe, and noticing which parts of your body are particularly tense. Purposefully relax the muscles in those areas.  Download or purchase mobile phone or tablet apps (applications) that can help you stick to your quit plan by providing reminders, tips, and encouragement. There are many free apps, such as QuitGuide from the [employer] manager for Disease Control and Prevention). You can find other support for quitting smoking (smoking cessation) through smokefree.gov and other websites. HOW WILL I FEEL WHEN I QUIT SMOKING? Within the first 24 hours of quitting smoking, you may start to feel some withdrawal symptoms. These symptoms are usually most noticeable 2-3 days after quitting, but they usually do not last beyond 2-3 weeks. Changes or symptoms that you might experience include:  Mood swings.  Restlessness, anxiety, or irritation.  Difficulty concentrating.  Dizziness.  Strong cravings for sugary foods in addition to nicotine.  Mild weight gain.  Constipation.  Nausea.  Coughing or a sore throat.  Changes in how your medicines work in your  body.  A depressed mood.  Difficulty sleeping (insomnia). After the first 2-3 weeks of quitting, you may start to notice more positive results, such as:  Improved sense of smell and taste.  Decreased coughing and sore throat.  Slower heart rate.  Lower blood pressure.  Clearer skin.  The ability to breathe more easily.  Fewer sick days. Quitting smoking is very challenging for most people. Do not get discouraged if you are not successful the first time. Some people need to make many attempts to quit before they achieve long-term success. Do your best to stick to your quit plan, and talk with your health care provider if you have any questions or concerns.   This information is not intended to replace advice given to you by your health care provider. Make sure you discuss any questions you have with your health care provider.   Document Released: 06/04/2001 Document Revised: 10/25/2014 Document Reviewed: 10/25/2014 Elsevier Interactive Patient Education 2016 [place] Can Quit Smoking If you are ready to quit smoking or are thinking about it, congratulations! You have chosen to help yourself be healthier and live longer! There are lots of different ways to quit smoking. Nicotine gum, nicotine patches, a nicotine inhaler, or nicotine nasal spray can help with  physical craving. Hypnosis, support groups, and medicines help break the habit of smoking. TIPS TO GET OFF AND STAY OFF CIGARETTES  Learn to predict your moods. Do not let a bad situation be your excuse to have a cigarette. Some situations in your life might tempt you to have a cigarette.  Ask friends and co-workers not to smoke around you.  Make your home smoke-free.  Never have "just one" cigarette. It leads to wanting another and another. Remind yourself of your decision to quit.  On a card, make a list of your reasons for not smoking. Read it at least the same number of times a day as you have a cigarette. Tell  yourself everyday, "I do not want to smoke. I choose not to smoke."  Ask someone at home or work to help you with your plan to quit smoking.  Have something planned after you eat or have a cup of coffee. Take a walk or get other exercise to perk you up. This will help to keep you from overeating.  Try a relaxation exercise to calm you down and decrease your stress. Remember, you may be tense and nervous the first two weeks after you quit. This will pass.  Find new activities to keep your hands busy. Play with a pen, coin, or rubber band. Doodle or draw things on paper.  Brush your teeth right after eating. This will help cut down the craving for the taste of tobacco after meals. You can try mouthwash too.  Try gum, breath mints, or diet candy to keep something in your mouth. IF YOU SMOKE AND WANT TO QUIT:  Do not stock up on cigarettes. Never buy a carton. Wait until one pack is finished before you buy another.  Never carry cigarettes with you at work or at home.  Keep cigarettes as far away from you as possible. Leave them with someone else.  Never carry matches or a lighter with you.  Ask yourself, "Do I need this cigarette or is this just a reflex?"  Bet with someone that you can quit. Put cigarette money in a piggy bank every morning. If you smoke, you give up the money. If you do not smoke, by the end of the week, you keep the money.  Keep trying. It takes 21 days to change a habit!  Talk to your doctor about using medicines to help you quit. These include nicotine replacement gum, lozenges, or skin patches.   This information is not intended to replace advice given to you by your health care provider. Make sure you discuss any questions you have with your health care provider.   Document Released: 04/06/2009 Document Revised: 09/02/2011 Document Reviewed: 04/06/2009 Elsevier Interactive Patient Education 2016 [employer] Facet Joint Block The facet joints connect the  bones of the spine (vertebrae). They make it possible for you to bend, twist, and make other movements with your spine. They also prevent you from overbending, overtwisting, and making other excessive movements.  A facet joint block is a procedure where a numbing medicine (anesthetic) is injected into a facet joint. Often, a type of anti-inflammatory medicine called a steroid is also injected. A facet joint block may be done for two reasons:   Diagnosis. A facet joint block may be done as a test to see whether neck or back pain is caused by a worn-down or infected facet joint. If the pain gets better after a facet joint block, it means the pain is probably coming from  the facet joint. If the pain does not get better, it means the pain is probably not coming from the facet joint.   Therapy. A facet joint block may be done to relieve neck or back pain caused by a facet joint. A facet joint block is only done as a therapy if the pain does not improve with medicine, exercise programs, physical therapy, and other forms of pain management. LET [facility] CARE PROVIDER KNOW ABOUT:   Any allergies you have.   All medicines you are taking, including vitamins, herbs, eyedrops, and over-the-counter medicines and creams.   Previous problems you or members of your family have had with the use of anesthetics.   Any blood disorders you have had.   Other health problems you have. RISKS AND COMPLICATIONS Generally, having a facet joint block is safe. However, as with any procedure, complications can occur. Possible complications associated with having a facet joint block include:   Bleeding.   Injury to a nerve near the injection site.   Pain at the injection site.   Weakness or numbness in areas controlled by nerves near the injection site.   Infection.   Temporary fluid retention.   Allergic reaction to anesthetics or medicines used during the procedure. BEFORE THE PROCEDURE   Follow  your health care provider's instructions if you are taking dietary supplements or medicines. You may need to stop taking them or reduce your dosage.   Do not take any new dietary supplements or medicines without asking your health care provider first.   Follow your health care provider's instructions about eating and drinking before the procedure. You may need to stop eating and drinking several hours before the procedure.   Arrange to have an adult drive you home after the procedure. PROCEDURE  You may need to remove your clothing and dress in an open-back gown so that your health care provider can access your spine.   The procedure will be done while you are lying on an X-ray table. Most of the time you will be asked to lie on your stomach, but you may be asked to lie in a different position if an injection will be made in your neck.   Special machines will be used to monitor your oxygen levels, heart rate, and blood pressure.   If an injection will be made in your neck, an intravenous (IV) tube will be inserted into one of your veins. Fluids and medicine will flow directly into your body through the IV tube.   The area over the facet joint where the injection will be made will be cleaned with an antiseptic soap. The surrounding skin will be covered with sterile drapes.   An anesthetic will be applied to your skin to make the injection area numb. You may feel a temporary stinging or burning sensation.   A video X-ray machine will be used to locate the joint. A contrast dye may be injected into the facet joint area to help with locating the joint.   When the joint is located, an anesthetic medicine will be injected into the joint through the needle.   Your health care provider will ask you whether you feel pain relief. If you do feel relief, a steroid may be injected to provide pain relief for a longer period of time. If you do not feel relief or feel only partial relief,  additional injections of an anesthetic may be made in other facet joints.   The needle will be removed,  the skin will be cleansed, and bandages will be applied.  AFTER THE PROCEDURE   You will be observed for 15-30 minutes before being allowed to go home. Do not drive. Have an adult drive you or take a taxi or public transportation instead.   If you feel pain relief, the pain will return in several hours or days when the anesthetic wears off.   You may feel pain relief 2-14 days after the procedure. The amount of time this relief lasts varies from person to person.   It is normal to feel some tenderness over the injected area(s) for 2 days following the procedure.   If you have diabetes, you may have a temporary increase in blood sugar.   This information is not intended to replace advice given to you by your health care provider. Make sure you discuss any questions you have with your health care provider.   Document Released: 10/30/2006 Document Revised: 07/01/2014 Document Reviewed: 03/30/2012 Elsevier Interactive Patient Education 2016 [place]  What are the risk, side effects and possible complications? Generally speaking, most procedures are safe.  However, with any procedure there are risks, side effects, and the possibility of complications.  The risks and complications are dependent upon the sites that are lesioned, or the type of nerve block to be performed.  The closer the procedure is to the spine, the more serious the risks are.  Great care is taken when placing the radio frequency needles, block needles or lesioning probes, but sometimes complications can occur. 1. Infection: Any time there is an injection through the skin, there is a risk of infection.  This is why sterile conditions are used for these blocks.  There are four possible types of infection. 1. Localized skin infection. 2. Central Nervous System Infection-This can be in  the form of Meningitis, which can be deadly. 3. Epidural Infections-This can be in the form of an epidural abscess, which can cause pressure inside of the spine, causing compression of the spinal cord with subsequent paralysis. This would require an emergency surgery to decompress, and there are no guarantees that the patient would recover from the paralysis. 4. Discitis-This is an infection of the intervertebral discs.  It occurs in about 1% of discography procedures.  It is difficult to treat and it may lead to surgery.        2. Pain: the needles have to go through skin and soft tissues, will cause soreness.       3. Damage to internal structures:  The nerves to be lesioned may be near blood vessels or    other nerves which can be potentially damaged.       4. Bleeding: Bleeding is more common if the patient is taking blood thinners such as  aspirin, Coumadin, Ticiid, Plavix, etc., or if he/she have some genetic predisposition  such as hemophilia. Bleeding into the spinal canal can cause compression of the spinal  cord with subsequent paralysis.  This would require an emergency surgery to  decompress and there are no guarantees that the patient would recover from the  paralysis.       5. Pneumothorax:  Puncturing of a lung is a possibility, every time a needle is introduced in  the area of the chest or upper back.  Pneumothorax refers to free air around the  collapsed lung(s), inside of the thoracic cavity (chest cavity).  Another two possible  complications related to a similar event would include: Hemothorax  and Chylothorax.   These are variations of the Pneumothorax, where instead of air around the collapsed  lung(s), you may have blood or chyle, respectively.       6. Spinal headaches: They may occur with any procedures in the area of the spine.       7. Persistent CSF (Cerebro-Spinal Fluid) leakage: This is a rare problem, but may occur  with prolonged intrathecal or epidural catheters either due  to the formation of a fistulous  track or a dural tear.       8. Nerve damage: By working so close to the spinal cord, there is always a possibility of  nerve damage, which could be as serious as a permanent spinal cord injury with  paralysis.       9. Death:  Although rare, severe deadly allergic reactions known as "Anaphylactic  reaction" can occur to any of the medications used.      10. Worsening of the symptoms:  We can always make thing worse.  What are the chances of something like this happening? Chances of any of this occuring are extremely low.  By statistics, you have more of a chance of getting killed in a motor vehicle accident: while driving to the hospital than any of the above occurring .  Nevertheless, you should be aware that they are possibilities.  In general, it is similar to taking a shower.  Everybody knows that you can slip, hit your head and get killed.  Does that mean that you should not shower again?  Nevertheless always keep in mind that statistics do not mean anything if you happen to be on the wrong side of them.  Even if a procedure has a 1 (one) in a 1,000,000 (million) chance of going wrong, it you happen to be that one..Also, keep in mind that by statistics, you have more of a chance of having something go wrong when taking medications.  Who should not have this procedure? If you are on a blood thinning medication (e.g. Coumadin, Plavix, see list of "Blood Thinners"), or if you have an active infection going on, you should not have the procedure.  If you are taking any blood thinners, please inform your physician.  How should I prepare for this procedure?  Do not eat or drink anything at least six hours prior to the procedure.  Bring a driver with you .  It cannot be a taxi.  Come accompanied by an adult that can drive you back, and that is strong enough to help you if your legs get weak or numb from the local anesthetic.  Take all of your medicines the morning of  the procedure with just enough water to swallow them.  If you have diabetes, make sure that you are scheduled to have your procedure done first thing in the morning, whenever possible.  If you have diabetes, take only half of your insulin dose and notify our nurse that you have done so as soon as you arrive at the clinic.  If you are diabetic, but only take blood sugar pills (oral hypoglycemic), then do not take them on the morning of your procedure.  You may take them after you have had the procedure.  Do not take aspirin or any aspirin-containing medications, at least eleven (11) days prior to the procedure.  They may prolong bleeding.  Wear loose fitting clothing that may be easy to take off and that you would not mind if it got stained with  Betadine or blood.  Do not wear any jewelry or perfume  Remove any nail coloring.  It will interfere with some of our monitoring equipment.  NOTE: Remember that this is not meant to be interpreted as a complete list of all possible complications.  Unforeseen problems may occur.  BLOOD THINNERS The following drugs contain aspirin or other products, which can cause increased bleeding during surgery and should not be taken for 2 weeks prior to and 1 week after surgery.  If you should need take something for relief of minor pain, you may take acetaminophen which is found in Tylenol,m Datril, Anacin-3 and Panadol. It is not blood thinner. The products listed below are.  Do not take any of the products listed below in addition to any listed on your instruction sheet.  A.P.C or A.P.C with Codeine Codeine Phosphate Capsules #3 Ibuprofen Ridaura  ABC compound Congesprin Imuran rimadil  Advil Cope Indocin Robaxisal  Alka-Seltzer Effervescent Pain Reliever and Antacid Coricidin or Coricidin-D  Indomethacin Rufen  Alka-Seltzer plus Cold Medicine Cosprin Ketoprofen S-A-C Tablets  Anacin Analgesic Tablets or Capsules Coumadin Korlgesic Salflex  Anacin Extra  Strength Analgesic tablets or capsules CP-2 Tablets Lanoril Salicylate  Anaprox Cuprimine Capsules Levenox Salocol  Anexsia-D Dalteparin Magan Salsalate  Anodynos Darvon compound Magnesium Salicylate Sine-off  Ansaid Dasin Capsules Magsal Sodium Salicylate  Anturane Depen Capsules Marnal Soma  APF Arthritis pain formula Dewitt's Pills Measurin Stanback  Argesic Dia-Gesic Meclofenamic Sulfinpyrazone  Arthritis Bayer Timed Release Aspirin Diclofenac Meclomen Sulindac  Arthritis pain formula Anacin Dicumarol Medipren Supac  Analgesic (Safety coated) Arthralgen Diffunasal Mefanamic Suprofen  Arthritis Strength Bufferin Dihydrocodeine Mepro Compound Suprol  Arthropan liquid Dopirydamole Methcarbomol with Aspirin Synalgos  ASA tablets/Enseals Disalcid Micrainin Tagament  Ascriptin Doan's Midol Talwin  Ascriptin A/D Dolene Mobidin Tanderil  Ascriptin Extra Strength Dolobid Moblgesic Ticlid  Ascriptin with Codeine Doloprin or Doloprin with Codeine Momentum Tolectin  Asperbuf Duoprin Mono-gesic Trendar  Aspergum Duradyne Motrin or Motrin IB Triminicin  Aspirin plain, buffered or enteric coated Durasal Myochrisine Trigesic  Aspirin Suppositories Easprin Nalfon Trillsate  Aspirin with Codeine Ecotrin Regular or Extra Strength Naprosyn Uracel  Atromid-S Efficin Naproxen Ursinus  Auranofin Capsules Elmiron Neocylate Vanquish  Axotal Emagrin Norgesic Verin  Azathioprine Empirin or Empirin with Codeine Normiflo Vitamin E  Azolid Emprazil Nuprin Voltaren  Bayer Aspirin plain, buffered or children's or timed BC Tablets or powders Encaprin Orgaran Warfarin Sodium  Buff-a-Comp Enoxaparin Orudis Zorpin  Buff-a-Comp with Codeine Equegesic Os-Cal-Gesic   Buffaprin Excedrin plain, buffered or Extra Strength Oxalid   Bufferin Arthritis Strength Feldene Oxphenbutazone   Bufferin plain or Extra Strength Feldene Capsules Oxycodone with Aspirin   Bufferin with Codeine Fenoprofen Fenoprofen Pabalate or  Pabalate-SF   Buffets II Flogesic Panagesic   Buffinol plain or Extra Strength Florinal or Florinal with Codeine Panwarfarin   Buf-Tabs Flurbiprofen Penicillamine   Butalbital Compound Four-way cold tablets Penicillin   Butazolidin Fragmin Pepto-Bismol   Carbenicillin Geminisyn Percodan   Carna Arthritis Reliever Geopen Persantine   Carprofen Gold's salt Persistin   Chloramphenicol Goody's Phenylbutazone   Chloromycetin Haltrain Piroxlcam   Clmetidine heparin Plaquenil   Cllnoril Hyco-pap Ponstel   Clofibrate Hydroxy chloroquine Propoxyphen         Before stopping any of these medications, be sure to consult the physician who ordered them.  Some, such as Coumadin (Warfarin) are ordered to prevent or treat serious conditions such as "deep thrombosis", "pumonary embolisms", and other heart problems.  The amount of time that you may  need off of the medication may also vary with the medication and the reason for which you were taking it.  If you are taking any of these medications, [email or web address] make sure you notify your pain physician before you undergo any [email or web address]

## 2016-04-24 NOTE — Progress Notes (Signed)
Patient's Name: Sara Munoz  MRN: [id number]  Referring Provider: [facility]  DOB: 04/29/1966  PCP: [id number]  DOS: 04/24/2016  Note by: Kathlen Brunswick. Dossie Arbour, MD  Service setting: Ambulatory outpatient  Specialty: Interventional Pain Management  Location: [facility] (AMB) Pain Management Facility    Patient type: Established   Primary Reason(s) for Visit: Encounter for evaluation before starting new chronic pain management plan of care (Level of risk: moderate) CC: Back Pain (lower)  HPI  [name] is a 50 y.o. year old, female patient, who comes today for an initial evaluation. She has Non-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/2014; Hyperlipidemia; Costochondritis; COPD (chronic obstructive pulmonary disease) ([place]); GERD (gastroesophageal reflux disease); Hypertension; Anemia of chronic disease; Carotid artery stenosis; Chronic constipation; Chronic cough; Chronic nausea; Cigarette nicotine dependence with nicotine-induced disorder; Coronary artery disease; Drug abuse, IV; GAD (generalized anxiety disorder); Hypercholesterolemia; Hypomagnesemia; Narcotic abuse, continuous; Narcotic dependence ([place]); Osteoarthritis; Polysubstance abuse; PTSD (post-traumatic stress disorder); PVD (peripheral vascular disease) ([place]); Sleep apnea, obstructive; Smoker; TIA (transient ischemic attack); Chronic pain; Long term current use of opiate analgesic; Long term prescription opiate use; Opiate use; Encounter for therapeutic drug level monitoring; Encounter for pain management consult; Chronic hip pain (Location of Tertiary source of pain) (Bilateral) (L>R); Chronic knee pain (Bilateral) (L>R); Chronic shoulder pain (Bilateral) (L>R); Chronic sacroiliac joint pain (Bilateral) (L>R); Chronic low back pain (Location of Primary Source of Pain) (Bilateral) (L>R); Chronic lower extremity pain (Location of Secondary source of pain) (Bilateral) (L>R); Osteoarthritis of hip (Bilateral) (L>R); Chronic neck pain  (posterior midline) (Bilateral) (L>R); Cervicogenic headache (Bilateral) (L>R); Occipital headache (Bilateral) (L>R); Chronic upper extremity pain (Bilateral) (L>R); Chronic Cervical radicular pain (Bilateral) (L>R); Chronic lumbar radicular pain (Right) (L5 dermatome); Lumbar facet syndrome (Bilateral) (L>R); and Long term prescription benzodiazepine use on her problem list.. Her primarily concern today is the Back Pain (lower)  Pain Assessment: Self-Reported Pain Score: 7 /10 Clinically the patient looks like a 2/10 Reported level is inconsistent with clinical observations. Information on the proper use of the pain score provided to the patient today. Pain Type: Chronic pain Pain Location: Back Pain Orientation: Lower Pain Descriptors / Indicators: Aching, Constant, Stabbing, Radiating Pain Frequency: Constant  [name] comes in today for a follow-up visit after her initial evaluation on 02/27/2016. Today we went over the results of her tests. These were explained in "Layman's terms". During today's appointment we went over my diagnostic impression, as well as the proposed treatment plan.  In considering the treatment plan options, [name] was reminded that I no longer take patients for medication management only. I asked her to let me know if she had no intention of taking advantage of the interventional therapies, so that we could make arrangements to provide this space to someone interested. I also made it clear that undergoing interventional therapies for the purpose of getting pain medications is very inappropriate on the part of a patient, and it will not be tolerated in this practice. This type of behavior would suggest true addiction and therefore it requires referral to an addiction specialist.   Further details on both, my assessment(s), as well as the proposed treatment plan, please see below. Controlled Substance Pharmacotherapy Assessment REMS (Risk Evaluation and Mitigation  Strategy)  Analgesic: None at this time according to patient. MME/day: 0 mg/day Pill Count: None expected due to no prior prescriptions written by our practice. Pharmacokinetics: Liberation and absorption (onset of action): WNL Distribution (time to peak effect): WNL Metabolism  and excretion (duration of action): WNL         Pharmacodynamics: Desired effects: Analgesia: The patient reports >50% benefit. Reported improvement in function: The patient reports medication allows her to accomplish basic ADLs. Clinically meaningful improvement in function (CMIF): Sustained CMIF goals met Perceived effectiveness: Described as relatively effective, allowing for increase in activities of daily living (ADL) Undesirable effects: Side-effects or Adverse reactions: None reported Monitoring: [place] PMP: Online review of the past 91-monthperiod previously conducted. Not applicable at this point since we have not taken over the patient's medication management yet. List of all UDS test(s) done:  Lab Results  Component Value Date   SUMMARY FINAL 02/27/2016   Last UDS on record: Summary  Date Value Ref Range Status  02/27/2016 FINAL  Final    Comment:    ==================================================================== TOXASSURE COMP DRUG ANALYSIS,UR ==================================================================== Test                             Result       Flag       Units Drug Present and Declared for Prescription Verification   Carboxy-THC                    12           EXPECTED   ng/mg creat    Carboxy-THC is a metabolite of tetrahydrocannabinol  (THC).    [facility] of [facility] most commonly illicit, but THC is also present    in a scheduled prescription medication.   Gabapentin                     PRESENT      EXPECTED   Tizanidine                     PRESENT      EXPECTED   Salicylate                     PRESENT      EXPECTED   Metoprolol                     PRESENT       EXPECTED ==================================================================== Test                      Result    Flag   Units      Ref Range   Creatinine              109              mg/dL      >=20 ==================================================================== Declared Medications:  The flagging and interpretation on this report are based on the  following declared medications.  Unexpected results may arise from  inaccuracies in the declared medications.  **Note: The testing scope of this panel includes these medications:  Gabapentin  Marijuana 2-3 DAYS AGO  Metoprolol  **Note: The testing scope of this panel does not include small to  moderate amounts of these reported medications:  Aspirin  Tizanidine (Zanaflex)  **Note: The testing scope of this panel does not include following  reported medications:  Albuterol  Atorvastatin  Budenoside (Symbicort)  Buspirone (BuSpar)  Dexlansoprazole (Dexilant)  Docusate (Colace)  Formoterol (Symbicort)  Linaclotide (Linzess)  Lisinopril  Nitroglycerin  Ranolazine (Ranexa) ==================================================================== For clinical consultation, please call [phone]====    UDS interpretation:  No unexpected findings.          Medication Assessment Form: Patient introduced to form today Treatment compliance: Treatment may start today if patient agrees with proposed plan. Evaluation of compliance is not applicable at this point Risk Assessment Profile: Aberrant behavior: use of illicit substances Comorbid factors increasing risk of overdose: See prior notes. No additional risks detected today Risk Mitigation Strategies:  Patient opioid safety counseling: Completed today. Counseling provided to patient as per "Patient Counseling Document". Document signed by patient, attesting to counseling and understanding Patient-Prescriber Agreement  (PPA): Obtained today  Controlled substance notification to other providers: Written and sent today  Pharmacologic Plan: Will hold on any opioid therapy until her UDS comes back clean.  Laboratory Chemistry  Inflammation Markers Lab Results  Component Value Date   ESRSEDRATE 7 03/01/2016   CRP 1.7 (H) 03/01/2016   Renal Function Lab Results  Component Value Date   BUN 10 03/01/2016   CREATININE 0.99 03/01/2016   GFRAA >60 03/01/2016   GFRNONAA >60 03/01/2016   Hepatic Function Lab Results  Component Value Date   AST 54 (H) 11/01/2015   ALT 66 (H) 11/01/2015   ALBUMIN 4.3 11/01/2015   Electrolytes Lab Results  Component Value Date   NA 136 03/01/2016   K 4.3 03/01/2016   CL 104 03/01/2016   CALCIUM 9.4 03/01/2016   MG 1.6 (L) 03/01/2016   Pain Modulating Vitamins Lab Results  Component Value Date   25OHVITD1 32 03/01/2016   25OHVITD2 <1.0 03/01/2016   25OHVITD3 32 03/01/2016   VITAMINB12 491 03/01/2016   Coagulation Parameters Lab Results  Component Value Date   PLT 147 (L) 03/01/2016   Cardiovascular Lab Results  Component Value Date   BNP 24.0 08/01/2015   HGB 14.7 03/01/2016   HCT 43.2 03/01/2016   Note: Lab results reviewed and explained to patient in Layman's terms.  Recent Diagnostic Imaging Review  Mm Outside Films Mammo  Result Date: 04/11/2016 CLINICAL DATA:  This exam is stored here for comparison purposes only and was performed at an outside facility.   Please contact the originating institution for any associated interpretation or report.   Cervical Imaging: Cervical DG 2-3 views:  Results for orders placed during the hospital encounter of 01/05/16  DG Cervical Spine 2-3 Views   Narrative CLINICAL DATA:  Generalized neck pain.  Fall today.  EXAM: CERVICAL SPINE - 2-3 VIEW  COMPARISON:  None.  FINDINGS: Early disc space narrowing at C6-7 with early spurring. Normal alignment. No fracture. Prevertebral soft tissues are  normal.  IMPRESSION: No acute bony abnormality.   Electronically Signed   By: Rolm Baptise M.D.   On: 01/05/2016 15:22    Cervical DG complete:  Results for orders placed in visit on 11/17/01  DG Cervical Spine Complete   Narrative FINDINGS CLINICAL DATA:    FELL, PAIN IN LOW BACK, LEFT LATERAL RIBS. CERVICAL SPINE COMPLETE AP, LATERAL, BOTH OBLIQUE, AND ODONTOID VIEWS OF THE CERVICAL SPINE SHOW NO EVIDENCE OF FRACTURE, DISLOCATION, OR FOREIGN BODY.  THE CERVICAL FORAMINA, VERTEBRAL BODY ALIGNMENT APPEAR TO BE WELL MAINTAINED. IMPRESSION NORMAL CERVICAL SPINE. THORACIC SPINE AP AND LATERAL VIEWS OF THE THORACIC SPINE SHOW ANTERIOR DEGENERATIVE HYPERTROPHIC SPURS AT THE T5- 6, T9-10, AND T11-12 LEVELS.  NO ACUTE FRACTURE OR DISLOCATION IS SEEN.  THERE IS NO EVIDENCE OF SOFT TISSUE ABNORMALITY. IMPRESSION NORMAL THORACIC SPINE EXCEPT FOR ANTERIOR HYPERTROPHIC SPURS AS NOTED ABOVE. LUMBAR SPINE COMPLETE AP, LATERAL, AND BOTH OBLIQUE VIEWS OF THE LUMBOSACRAL  SPINE SHOW ANTERIOR DEGENERATIVE HYPERTROPHIC SPURS OF THE L3-4 AND L4-5 LEVELS.  NO FRACTURE, DISLOCATION, OR FOREIGN BODY IS SEEN. IMPRESSION ANTERIOR DEGENERATIVE HYPERTROPHIC SPURS AT L3-4 AND L4-5.   NO FRACTURE. LEFT RIBS WITH CHEST FIVE VIEWS OF THE LEFT RIBS WERE MADE WITHOUT PREVIOUS FILMS FOR COMPARISON AND SHOW NO EVIDENCE OF ACUTE FRACTURE, DISLOCATION, OR FOREIGN BODY.  THERE IS NO PNEUMOTHORAX OR PLEURAL EFFUSION.  THERE IS SLIGHT THICKENING OF THE ANTERIOR ASPECT OF THE LEFT 11TH RIB WHICH SUGGESTS THE POSSIBILITY OF AN OLD HEALED FRACTURE, BUT NO DEFINITE ACUTE FRACTURE IS SEEN. IMPRESSION NO DEFINITE ACUTE FRACTURE OR PNEUMOTHORAX IS SEEN.  THERE IS MILD THICKENING OF THE ANTERIOR ASPECT OF THE LEFT 11TH RIB WHICH MAY BE ASSOCIATED WITH AN OLD HEALED FRACTURE.   Shoulder Imaging: Shoulder-R DG:  Results for orders placed during the hospital encounter of 03/01/16  DG Shoulder Right   Narrative CLINICAL  DATA:  Shoulder pain, no known injury, initial encounter  EXAM: RIGHT SHOULDER - 2+ VIEW  COMPARISON:  None.  FINDINGS: There is no evidence of fracture or dislocation. There is no evidence of arthropathy or other focal bone abnormality. Soft tissues are unremarkable.  IMPRESSION: No acute abnormality noted.   Electronically Signed   By: Inez Catalina M.D.   On: 03/01/2016 15:24    Shoulder-L DG:  Results for orders placed during the hospital encounter of 03/01/16  DG Shoulder Left   Narrative CLINICAL DATA:  Chronic bilateral shoulder pain.  EXAM: LEFT SHOULDER - 2+ VIEW  COMPARISON:  None.  FINDINGS: There is no evidence of fracture or dislocation. There is no evidence of arthropathy or other focal bone abnormality. Soft tissues are unremarkable.  IMPRESSION: Negative.   Electronically Signed   By: Rolm Baptise M.D.   On: 03/01/2016 15:19     Thoracic Imaging: Thoracic DG 2-3 views:  Results for orders placed in visit on 11/17/01  DG Thoracic Spine 1 View   Narrative FINDINGS CLINICAL DATA:    FELL, PAIN IN LOW BACK, LEFT LATERAL RIBS. CERVICAL SPINE COMPLETE AP, LATERAL, BOTH OBLIQUE, AND ODONTOID VIEWS OF THE CERVICAL SPINE SHOW NO EVIDENCE OF FRACTURE, DISLOCATION, OR FOREIGN BODY.  THE CERVICAL FORAMINA, VERTEBRAL BODY ALIGNMENT APPEAR TO BE WELL MAINTAINED. IMPRESSION NORMAL CERVICAL SPINE. THORACIC SPINE AP AND LATERAL VIEWS OF THE THORACIC SPINE SHOW ANTERIOR DEGENERATIVE HYPERTROPHIC SPURS AT THE T5- 6, T9-10, AND T11-12 LEVELS.  NO ACUTE FRACTURE OR DISLOCATION IS SEEN.  THERE IS NO EVIDENCE OF SOFT TISSUE ABNORMALITY. IMPRESSION NORMAL THORACIC SPINE EXCEPT FOR ANTERIOR HYPERTROPHIC SPURS AS NOTED ABOVE. LUMBAR SPINE COMPLETE AP, LATERAL, AND BOTH OBLIQUE VIEWS OF THE LUMBOSACRAL SPINE SHOW ANTERIOR DEGENERATIVE HYPERTROPHIC SPURS OF THE L3-4 AND L4-5 LEVELS.  NO FRACTURE, DISLOCATION, OR FOREIGN BODY IS SEEN. IMPRESSION ANTERIOR  DEGENERATIVE HYPERTROPHIC SPURS AT L3-4 AND L4-5.   NO FRACTURE. LEFT RIBS WITH CHEST FIVE VIEWS OF THE LEFT RIBS WERE MADE WITHOUT PREVIOUS FILMS FOR COMPARISON AND SHOW NO EVIDENCE OF ACUTE FRACTURE, DISLOCATION, OR FOREIGN BODY.  THERE IS NO PNEUMOTHORAX OR PLEURAL EFFUSION.  THERE IS SLIGHT THICKENING OF THE ANTERIOR ASPECT OF THE LEFT 11TH RIB WHICH SUGGESTS THE POSSIBILITY OF AN OLD HEALED FRACTURE, BUT NO DEFINITE ACUTE FRACTURE IS SEEN. IMPRESSION NO DEFINITE ACUTE FRACTURE OR PNEUMOTHORAX IS SEEN.  THERE IS MILD THICKENING OF THE ANTERIOR ASPECT OF THE LEFT 11TH RIB WHICH MAY BE ASSOCIATED WITH AN OLD HEALED FRACTURE.   Lumbosacral Imaging: Lumbar DG 2-3 views:  Results for orders  placed during the hospital encounter of 01/05/16  DG Lumbar Spine 2-3 Views   Narrative CLINICAL DATA:  Fall today.  Generalized low back pain.  EXAM: LUMBAR SPINE - 2-3 VIEW  COMPARISON:  None.  FINDINGS: Mild disc space narrowing at L5-S1. Early anterior degenerative spurring in the lumbar spine. Normal alignment. No fracture. SI joints are symmetric and unremarkable.  IMPRESSION: No acute bony abnormality.   Electronically Signed   By: Rolm Baptise M.D.   On: 01/05/2016 15:23    Lumbar DG (Complete) 4+V:  Results for orders placed in visit on 11/17/01  DG Lumbar Spine Complete   Narrative FINDINGS CLINICAL DATA:    FELL, PAIN IN LOW BACK, LEFT LATERAL RIBS. CERVICAL SPINE COMPLETE AP, LATERAL, BOTH OBLIQUE, AND ODONTOID VIEWS OF THE CERVICAL SPINE SHOW NO EVIDENCE OF FRACTURE, DISLOCATION, OR FOREIGN BODY.  THE CERVICAL FORAMINA, VERTEBRAL BODY ALIGNMENT APPEAR TO BE WELL MAINTAINED. IMPRESSION NORMAL CERVICAL SPINE. THORACIC SPINE AP AND LATERAL VIEWS OF THE THORACIC SPINE SHOW ANTERIOR DEGENERATIVE HYPERTROPHIC SPURS AT THE T5- 6, T9-10, AND T11-12 LEVELS.  NO ACUTE FRACTURE OR DISLOCATION IS SEEN.  THERE IS NO EVIDENCE OF SOFT TISSUE ABNORMALITY. IMPRESSION NORMAL THORACIC  SPINE EXCEPT FOR ANTERIOR HYPERTROPHIC SPURS AS NOTED ABOVE. LUMBAR SPINE COMPLETE AP, LATERAL, AND BOTH OBLIQUE VIEWS OF THE LUMBOSACRAL SPINE SHOW ANTERIOR DEGENERATIVE HYPERTROPHIC SPURS OF THE L3-4 AND L4-5 LEVELS.  NO FRACTURE, DISLOCATION, OR FOREIGN BODY IS SEEN. IMPRESSION ANTERIOR DEGENERATIVE HYPERTROPHIC SPURS AT L3-4 AND L4-5.   NO FRACTURE. LEFT RIBS WITH CHEST FIVE VIEWS OF THE LEFT RIBS WERE MADE WITHOUT PREVIOUS FILMS FOR COMPARISON AND SHOW NO EVIDENCE OF ACUTE FRACTURE, DISLOCATION, OR FOREIGN BODY.  THERE IS NO PNEUMOTHORAX OR PLEURAL EFFUSION.  THERE IS SLIGHT THICKENING OF THE ANTERIOR ASPECT OF THE LEFT 11TH RIB WHICH SUGGESTS THE POSSIBILITY OF AN OLD HEALED FRACTURE, BUT NO DEFINITE ACUTE FRACTURE IS SEEN. IMPRESSION NO DEFINITE ACUTE FRACTURE OR PNEUMOTHORAX IS SEEN.  THERE IS MILD THICKENING OF THE ANTERIOR ASPECT OF THE LEFT 11TH RIB WHICH MAY BE ASSOCIATED WITH AN OLD HEALED FRACTURE.   Sacroiliac Joint Imaging: Sacroiliac Joint DG:  Results for orders placed during the hospital encounter of 03/01/16  DG Si Joints   Narrative CLINICAL DATA:  Hip pain, no known injury, initial encounter  EXAM: BILATERAL SACROILIAC JOINTS - 3+ VIEW  COMPARISON:  None.  FINDINGS: Mild inferior osteophytes are noted along the sacroiliac joints bilaterally. Sacral ala are within normal limits. The pelvic ring is intact. No fracture is noted.  IMPRESSION: Mild degenerative change without acute abnormality.   Electronically Signed   By: Inez Catalina M.D.   On: 03/01/2016 15:27    Hip Imaging: Hip-R DG 2-3 views:  Results for orders placed during the hospital encounter of 03/01/16  DG HIP UNILAT W OR W/O PELVIS 2-3 VIEWS RIGHT   Narrative CLINICAL DATA:  Chronic bilateral hip pain.  EXAM: DG HIP (WITH OR WITHOUT PELVIS) 2-3V RIGHT  COMPARISON:  None.  FINDINGS: There is no evidence of hip fracture or dislocation. There is no evidence of arthropathy or other  focal bone abnormality.  IMPRESSION: Normal right hip.   Electronically Signed   By: Marijo Conception, M.D.   On: 03/01/2016 15:18    Hip-L DG 2-3 views:  Results for orders placed during the hospital encounter of 03/01/16  DG HIP UNILAT W OR W/O PELVIS 2-3 VIEWS LEFT   Narrative CLINICAL DATA:  Chronic bilateral hip pain.  EXAM: DG  HIP (WITH OR WITHOUT PELVIS) 2-3V LEFT  COMPARISON:  None.  FINDINGS: Intra medullary nail noted within the left femur. Mild spurring in the hip joints bilaterally. Joint spaces are maintained. SI joints are symmetric and unremarkable. No acute bony abnormality. Specifically, no fracture, subluxation, or dislocation. Soft tissues are intact.  IMPRESSION: No acute bony abnormality.  Early bilateral spurring in the hips.   Electronically Signed   By: Rolm Baptise M.D.   On: 03/01/2016 15:15    Knee Imaging: Knee-R DG 1-2 views:  Results for orders placed during the hospital encounter of 03/01/16  DG Knee 1-2 Views Right   Narrative CLINICAL DATA:  Right knee pain, no known injury, initial encounter  EXAM: RIGHT KNEE - 1-2 VIEW  COMPARISON:  01/05/2016  FINDINGS: Medullary rod is noted in the proximal tibia. Old healed fibular fracture is noted. Mild medial joint space narrowing is noted. No joint effusion is seen.  IMPRESSION: Postoperative and degenerative changes without acute abnormality.   Electronically Signed   By: Inez Catalina M.D.   On: 03/01/2016 15:29    Knee-L DG 1-2 views:  Results for orders placed during the hospital encounter of 03/01/16  DG Knee 1-2 Views Left   Narrative CLINICAL DATA:  Left-sided knee pain, no known injury, initial encounter  EXAM: LEFT KNEE - 1-2 VIEW  COMPARISON:  None.  FINDINGS: Changes of prior femoral medullary rod placement are noted. No acute fracture or dislocation is noted. No soft tissue abnormality is seen. Minimal osteophytes are noted along the superior aspect of  the patella.  IMPRESSION: Postsurgical and degenerative changes without acute abnormality.   Electronically Signed   By: Inez Catalina M.D.   On: 03/01/2016 15:26    Note: Imaging results reviewed and explained to patient in Layman's terms.  Meds  The patient has a current medication list which includes the following prescription(s): albuterol, albuterol, aspirin ec, atorvastatin, budesonide-formoterol, buspirone, dexlansoprazole, docusate sodium, gabapentin, linaclotide, lisinopril, magnesium oxide, metoprolol tartrate, nitroglycerin, potassium chloride, ranexa, and tizanidine.  Current Outpatient Prescriptions on File Prior to Visit  Medication Sig  . albuterol (PROAIR HFA) 108 (90 Base) MCG/ACT inhaler INHALE 2 PUFFS BY MOUTH EVERY 6 HOURS AS NEEDED  . albuterol (PROVENTIL HFA;VENTOLIN HFA) 108 (90 Base) MCG/ACT inhaler Inhale 2 puffs into the lungs every 4 (four) hours as needed for wheezing or shortness of breath.  Marland Kitchen aspirin EC 81 MG tablet Take 81 mg by mouth daily.  Marland Kitchen atorvastatin (LIPITOR) 20 MG tablet Take 20 mg by mouth at bedtime.  . budesonide-formoterol (SYMBICORT) 160-4.5 MCG/ACT inhaler Inhale 2 puffs into the lungs 2 (two) times daily.  . busPIRone (BUSPAR) 7.5 MG tablet Take 7.5 mg by mouth 2 (two) times daily.  Marland Kitchen dexlansoprazole (DEXILANT) 60 MG capsule Take 60 mg by mouth daily.  Marland Kitchen docusate sodium (COLACE) 100 MG capsule Take 200 mg by mouth 2 (two) times daily as needed for mild constipation.  . gabapentin (NEURONTIN) 300 MG capsule Take 2 capsules (600 mg total) by mouth 3 (three) times daily.  Marland Kitchen linaclotide (LINZESS) 290 MCG CAPS capsule Take 290 mcg by mouth daily before breakfast.  . lisinopril (PRINIVIL,ZESTRIL) 20 MG tablet Take 20 mg by mouth daily.  . metoprolol tartrate (LOPRESSOR) 25 MG tablet Take 1 tablet (25 mg total) by mouth 2 (two) times daily.  . nitroGLYCERIN (NITROSTAT) 0.4 MG SL tablet Place 1 tablet (0.4 mg total) under the tongue every 5 (five)  minutes x 3 doses as needed for chest  pain.  Marland Kitchen RANEXA 500 MG 12 hr tablet Take 1 tablet (500 mg total) by mouth 2 (two) times daily.  Marland Kitchen tiZANidine (ZANAFLEX) 4 MG tablet Take 2 tablets (8 mg total) by mouth 3 (three) times daily. (Patient taking differently: Take 4 mg by mouth 3 (three) times daily. )   No current facility-administered medications on file prior to visit.    ROS  Constitutional: Denies any fever or chills Gastrointestinal: No reported hemesis, hematochezia, vomiting, or acute GI distress Musculoskeletal: Denies any acute onset joint swelling, redness, loss of ROM, or weakness Neurological: No reported episodes of acute onset apraxia, aphasia, dysarthria, agnosia, amnesia, paralysis, loss of coordination, or loss of consciousness  Allergies  Ms. Weitzman is allergic to levofloxacin; flexeril [cyclobenzaprine]; keflex [cephalexin]; ketorolac; toradol [ketorolac tromethamine]; tramadol; and zoloft [sertraline hcl].  [place]  Drug: Ms. Pina  reports that she uses drugs, including Marijuana. Alcohol:  reports that she drinks alcohol. Tobacco:  reports that she has been smoking Cigarettes.  She has been smoking about 1.00 pack per day. She has never used smokeless tobacco. Medical:  has a past medical history of Anxiety; Atypical chest pain (08/02/2015); CAD (coronary artery disease); Chronic back pain; COPD (chronic obstructive pulmonary disease) ([place]); Coronary artery disease; GERD (gastroesophageal reflux disease); Hypercholesteremia; Hypertension; Hypokalemia; Hypomagnesemia (01/04/2014); MI (myocardial infarction); and Osteoarthritis. Family: family history includes Heart disease in her mother; Lung cancer in her mother; Ovarian cancer in her mother.  Past Surgical History:  Procedure Laterality Date  . CARDIAC CATHETERIZATION Left 02/22/2016   Procedure: Left Heart Cath and Coronary Angiography;  Surgeon: Yolonda Kida, MD;  Location: [place] [facility];  Service:  Cardiovascular;  Laterality: Left;  . hemorroids    . ORIF FEMUR FRACTURE    . ORIF TIBIA & FIBULA FRACTURES    . OVARY SURGERY     Constitutional Exam  General appearance: Well nourished, well developed, and well hydrated. In no apparent acute distress Vitals:   04/24/16 0828  BP: (!) 180/91  Pulse: 64  Resp: 16  Temp: 98.4 F (36.9 C)  TempSrc: Oral  SpO2: 100%  Weight: 196 lb (88.9 kg)  Height: 5' 4" (1.626 m)   BMI Assessment: Estimated body mass index is 33.64 kg/m as calculated from the following:   Height as of this encounter: 5' 4" (1.626 m).   Weight as of this encounter: 196 lb (88.9 kg).  BMI interpretation table: BMI level Category Range association with higher incidence of chronic pain  <18 kg/m2 Underweight   18.5-24.9 kg/m2 Ideal body weight   25-29.9 kg/m2 Overweight Increased incidence by 20%  30-34.9 kg/m2 Obese (Class I) Increased incidence by 68%  35-39.9 kg/m2 Severe obesity (Class II) Increased incidence by 136%  >40 kg/m2 Extreme obesity (Class III) Increased incidence by 254%   BMI Readings from Last 4 Encounters:  04/24/16 33.64 kg/m  03/01/16 30.90 kg/m  02/27/16 30.90 kg/m  02/22/16 30.90 kg/m   Wt Readings from Last 4 Encounters:  04/24/16 196 lb (88.9 kg)  03/01/16 180 lb (81.6 kg)  02/27/16 180 lb (81.6 kg)  02/22/16 180 lb (81.6 kg)  Psych/Mental status: Alert, oriented x 3 (person, place, & time) Eyes: PERLA Respiratory: No evidence of acute respiratory distress  Cervical Spine Exam  Inspection: No masses, redness, or swelling Alignment: Symmetrical Functional ROM: Unrestricted ROM Stability: No instability detected Muscle strength & Tone: Functionally intact Sensory: Unimpaired Palpation: Non-contributory  Upper Extremity (UE) Exam    Side: Right upper extremity  Side: Left upper extremity  Inspection: No masses, redness, swelling, or asymmetry  Inspection: No masses, redness, swelling, or asymmetry  Functional ROM:  Unrestricted ROM         Functional ROM: Unrestricted ROM          Muscle strength & Tone: Functionally intact  Muscle strength & Tone: Functionally intact  Sensory: Unimpaired  Sensory: Unimpaired  Palpation: Non-contributory  Palpation: Non-contributory   Thoracic Spine Exam  Inspection: No masses, redness, or swelling Alignment: Symmetrical Functional ROM: Unrestricted ROM Stability: No instability detected Sensory: Unimpaired Muscle strength & Tone: Functionally intact Palpation: Non-contributory  Lumbar Spine Exam  Inspection: No masses, redness, or swelling Alignment: Symmetrical Functional ROM: Decreased ROM Stability: No instability detected Muscle strength & Tone: Functionally intact Sensory: Movement-associated pain Palpation: Complains of area being tender to palpation Provocative Tests: Lumbar Hyperextension and rotation test: Positive bilaterally for facet joint pain. Patrick's Maneuver: evaluation deferred today              Gait & Posture Assessment  Ambulation: Unassisted Gait: Relatively normal for age and body habitus Posture: WNL   Lower Extremity Exam    Side: Right lower extremity  Side: Left lower extremity  Inspection: No masses, redness, swelling, or asymmetry  Inspection: No masses, redness, swelling, or asymmetry  Functional ROM: Unrestricted ROM          Functional ROM: Unrestricted ROM          Muscle strength & Tone: Functionally intact  Muscle strength & Tone: Functionally intact  Sensory: Unimpaired  Sensory: Unimpaired  Palpation: Non-contributory  Palpation: Non-contributory   Assessment & Plan  Primary Diagnosis & Pertinent Problem List: The primary encounter diagnosis was Chronic low back pain (Location of Primary Source of Pain) (Bilateral) (L>R). Diagnoses of Long term current use of opiate analgesic, Opiate use, Chronic lower extremity pain (Location of Secondary source of pain) (Bilateral) (L>R), and Hypomagnesemia were also pertinent to  this visit.  Visit Diagnosis: 1. Chronic low back pain (Location of Primary Source of Pain) (Bilateral) (L>R)   2. Long term current use of opiate analgesic   3. Opiate use   4. Chronic lower extremity pain (Location of Secondary source of pain) (Bilateral) (L>R)   5. Hypomagnesemia    Problems updated and reviewed during this visit: Problem  Chronic low back pain (Location of Primary Source of Pain) (Bilateral) (L>R)  Chronic lower extremity pain (Location of Secondary source of pain) (Bilateral) (L>R)   Problem-specific Plan(s): No problem-specific Assessment & Plan notes found for this encounter.  No new Assessment & Plan notes have been filed under this hospital service since the last note was generated. Service: Pain Management  Plan of Care   Problem List Items Addressed This Visit      High   Chronic low back pain (Location of Primary Source of Pain) (Bilateral) (L>R) - Primary (Chronic)   Relevant Orders   LUMBAR FACET(MEDIAL BRANCH NERVE BLOCK) MBNB   Chronic lower extremity pain (Location of Secondary source of pain) (Bilateral) (L>R) (Chronic)     Medium   Hypomagnesemia   Relevant Medications   Magnesium Oxide 500 MG CAPS   Long term current use of opiate analgesic (Chronic)   Relevant Orders   ToxASSURE Select 13 (MW), Urine   Opiate use (Chronic)    Other Visit Diagnoses   None.    Pharmacotherapy (Medications Ordered): Meds ordered this encounter  Medications  . Magnesium Oxide 500 MG CAPS    Sig: Take  1 capsule (500 mg total) by mouth 2 (two) times daily at 8 am and 10 pm.    Dispense:  100 capsule    Refill:  PRN    Do not place this medication, or any other prescription from our practice, on "Automatic Refill". Patient may have prescription filled one day early if pharmacy is closed on scheduled refill date.   New Prescriptions   No medications on file   Medications administered during this visit: Ms. Faxon had no medications administered  during this visit. Lab-work, Procedure(s), & Referral(s) Ordered: Orders Placed This Encounter  Procedures  . LUMBAR FACET(MEDIAL BRANCH NERVE BLOCK) MBNB  . ToxASSURE Select 13 (MW), Urine   Imaging & Referral(s) Ordered: None  Pharmacotherapy: Plan:  We will stay away from opiate therapy as long as the UDS is positive for THC.    Interventional Therapies: Pending/Scheduled/Planned:    Diagnostic bilateral lumbar facet block under fluoroscopic guidance and IV sedation.    Considering:   Diagnostic bilateral lumbar facet block under fluoroscopic guidance and IV sedation.  Possible bilateral lumbar facet radiofrequency ablation under fluoroscopic guidance and IV sedation.  Diagnostic bilateral sacroiliac joint block under fluoroscopic guidance, with or without sedation.  Possible bilateral sacroiliac joint radiofrequency ablation under fluoroscopic guidance and IV sedation.  Diagnostic right-sided L4-5 interlaminar lumbar epidural steroid injection under fluoroscopic guidance, with or without sedation.  Diagnostic right-sided L5-S1 transforaminal epidural steroid injection under fluoroscopic guidance, with or without sedation.  Diagnostic bilateral intra-articular hip joint injection under fluoroscopic guidance, with or without sedation.  Diagnostic bilateral up to date her and femoral nerve articular branch blocks under fluoroscopic guidance, with or without sedation.  Possible bilateral hip joint radiofrequency ablation under fluoroscopic guidance and IV sedation.  Diagnostic bilateral cervical facet block under fluoroscopic guidance and IV sedation.  Possible bilateral cervical facet radiofrequency ablation under fluoroscopic guidance and IV sedation.  Diagnostic left-sided cervical epidural steroid injection under fluoroscopic guidance, with or without sedation.  Diagnostic bilateral intra-articular shoulder joint injection under fluoroscopic guidance, with or without sedation.   Diagnostic bilateral suprascapular nerve block under fluoroscopic guidance and IV sedation.  Possible bilateral suprascapular nerve radiofrequency ablation under fluoroscopic guidance and IV sedation.  Diagnostic bilateral intra-articular knee joint injection with local anesthetic and steroids without fluoroscopy or IV sedation.  Possible series of 5 intra-articular Hyalgan knee injections without fluoroscopy or IV sedation.  Diagnostic bilateral genicular nerve blocks under fluoroscopic guidance and IV sedation.  Possible bilateral genicular nerve radiofrequency ablation under fluoroscopic guidance and IV sedation.  Diagnostic bilateral greater occipital nerve blocks under fluoroscopic guidance, with or without sedation.  Possible bilateral greater occipital nerve radiofrequency ablation under fluoroscopic guidance and IV sedation.    PRN Procedures:   None at this point.    Requested PM Follow-up: Return for Schedule Procedure, (ASAA).  Future Appointments Date Time Provider [place]  04/29/2016 8:45 AM Milinda Pointer, MD [facility] None  05/24/2016 10:40 AM [facility]    Primary Care Physician: [place] Location: [facility] Outpatient Pain Management Facility Note by: Kathlen Brunswick. Dossie Arbour, M.D, DABA, DABAPM, DABPM, DABIPP, FIPP  Pain Score Disclaimer: We use the NRS-11 scale. This is a self-reported, subjective measurement of pain severity with only modest accuracy. It is used primarily to identify changes within a particular patient. It must be understood that outpatient pain scales are significantly less accurate that those used for research, where they can be applied under ideal controlled circumstances with minimal exposure to variables. In  reality, the score is likely to be a combination of pain intensity and pain affect, where pain affect describes the degree of emotional arousal or changes in action readiness caused by the sensory experience of  pain. Factors such as social and work situation, setting, emotional state, anxiety levels, expectation, and prior pain experience may influence pain perception and show large inter-individual differences that may also be affected by time variables.  Patient instructions provided during this appointment: Patient Instructions   Steps to Quit Smoking  Smoking tobacco can be harmful to your health and can affect almost every organ in your body. Smoking puts you, and those around you, at risk for developing many serious chronic diseases. Quitting smoking is difficult, but it is one of the best things that you can do for your health. It is never too late to quit. WHAT ARE THE BENEFITS OF QUITTING SMOKING? When you quit smoking, you lower your risk of developing serious diseases and conditions, such as:  Lung cancer or lung disease, such as COPD.  Heart disease.  Stroke.  Heart attack.  Infertility.  Osteoporosis and bone fractures. Additionally, symptoms such as coughing, wheezing, and shortness of breath may get better when you quit. You may also find that you get sick less often because your body is stronger at fighting off colds and infections. If you are pregnant, quitting smoking can help to reduce your chances of having a baby of low birth weight. HOW DO I GET READY TO QUIT? When you decide to quit smoking, create a plan to make sure that you are successful. Before you quit:  Pick a date to quit. Set a date within the next two weeks to give you time to prepare.  Write down the reasons why you are quitting. Keep this list in places where you will see it often, such as on your bathroom mirror or in your car or wallet.  Identify the people, places, things, and activities that make you want to smoke (triggers) and avoid them. Make sure to take these actions:  Throw away all cigarettes at home, at work, and in your car.  Throw away smoking accessories, such as Scientist, physiological.  Clean your car and make sure to empty the ashtray.  Clean your home, including curtains and carpets.  Tell your family, friends, and coworkers that you are quitting. Support from your loved ones can make quitting easier.  Talk with your health care provider about your options for quitting smoking.  Find out what treatment options are covered by your health insurance. WHAT STRATEGIES CAN I USE TO QUIT SMOKING?  Talk with your healthcare provider about different strategies to quit smoking. Some strategies include:  Quitting smoking altogether instead of gradually lessening how much you smoke over a period of time. Research shows that quitting "cold [place]" is more successful than gradually quitting.  Attending in-person counseling to help you build problem-solving skills. You are more likely to have success in quitting if you attend several counseling sessions. Even short sessions of 10 minutes can be effective.  Finding resources and support systems that can help you to quit smoking and remain smoke-free after you quit. These resources are most helpful when you use them often. They can include:  Online chats with a Social worker.  Telephone quitlines.  Printed Furniture conservator/restorer.  Support groups or group counseling.  Text messaging programs.  Mobile phone applications.  Taking medicines to help you quit smoking. (If you are pregnant or breastfeeding, talk with  your health care provider first.) Some medicines contain nicotine and some do not. Both types of medicines help with cravings, but the medicines that include nicotine help to relieve withdrawal symptoms. Your health care provider may recommend:  Nicotine patches, gum, or lozenges.  Nicotine inhalers or sprays.  Non-nicotine medicine that is taken by mouth. Talk with your health care provider about combining strategies, such as taking medicines while you are also receiving in-person counseling. Using these two  strategies together makes you more likely to succeed in quitting than if you used either strategy on its own. If you are pregnant or breastfeeding, talk with your health care provider about finding counseling or other support strategies to quit smoking. Do not take medicine to help you quit smoking unless told to do so by your health care provider. WHAT THINGS CAN I DO TO MAKE IT EASIER TO QUIT? Quitting smoking might feel overwhelming at first, but there is a lot that you can do to make it easier. Take these important actions:  Reach out to your family and friends and ask that they support and encourage you during this time. Call telephone quitlines, reach out to support groups, or work with a counselor for support.  Ask people who smoke to avoid smoking around you.  Avoid places that trigger you to smoke, such as bars, parties, or smoke-break areas at work.  Spend time around people who do not smoke.  Lessen stress in your life, because stress can be a smoking trigger for some people. To lessen stress, try:  Exercising regularly.  Deep-breathing exercises.  Yoga.  Meditating.  Performing a body scan. This involves closing your eyes, scanning your body from head to toe, and noticing which parts of your body are particularly tense. Purposefully relax the muscles in those areas.  Download or purchase mobile phone or tablet apps (applications) that can help you stick to your quit plan by providing reminders, tips, and encouragement. There are many free apps, such as QuitGuide from the [employer] Office manager for Disease Control and Prevention). You can find other support for quitting smoking (smoking cessation) through smokefree.gov and other websites. HOW WILL I FEEL WHEN I QUIT SMOKING? Within the first 24 hours of quitting smoking, you may start to feel some withdrawal symptoms. These symptoms are usually most noticeable 2-3 days after quitting, but they usually do not last beyond 2-3 weeks. Changes  or symptoms that you might experience include:  Mood swings.  Restlessness, anxiety, or irritation.  Difficulty concentrating.  Dizziness.  Strong cravings for sugary foods in addition to nicotine.  Mild weight gain.  Constipation.  Nausea.  Coughing or a sore throat.  Changes in how your medicines work in your body.  A depressed mood.  Difficulty sleeping (insomnia). After the first 2-3 weeks of quitting, you may start to notice more positive results, such as:  Improved sense of smell and taste.  Decreased coughing and sore throat.  Slower heart rate.  Lower blood pressure.  Clearer skin.  The ability to breathe more easily.  Fewer sick days. Quitting smoking is very challenging for most people. Do not get discouraged if you are not successful the first time. Some people need to make many attempts to quit before they achieve long-term success. Do your best to stick to your quit plan, and talk with your health care provider if you have any questions or concerns.   This information is not intended to replace advice given to you by your health care provider.  Make sure you discuss any questions you have with your health care provider.   Document Released: 06/04/2001 Document Revised: 10/25/2014 Document Reviewed: 10/25/2014 Elsevier Interactive Patient Education 2016 [place] Can Quit Smoking If you are ready to quit smoking or are thinking about it, congratulations! You have chosen to help yourself be healthier and live longer! There are lots of different ways to quit smoking. Nicotine gum, nicotine patches, a nicotine inhaler, or nicotine nasal spray can help with physical craving. Hypnosis, support groups, and medicines help break the habit of smoking. TIPS TO GET OFF AND STAY OFF CIGARETTES  Learn to predict your moods. Do not let a bad situation be your excuse to have a cigarette. Some situations in your life might tempt you to have a cigarette.  Ask  friends and co-workers not to smoke around you.  Make your home smoke-free.  Never have "just one" cigarette. It leads to wanting another and another. Remind yourself of your decision to quit.  On a card, make a list of your reasons for not smoking. Read it at least the same number of times a day as you have a cigarette. Tell yourself everyday, "I do not want to smoke. I choose not to smoke."  Ask someone at home or work to help you with your plan to quit smoking.  Have something planned after you eat or have a cup of coffee. Take a walk or get other exercise to perk you up. This will help to keep you from overeating.  Try a relaxation exercise to calm you down and decrease your stress. Remember, you may be tense and nervous the first two weeks after you quit. This will pass.  Find new activities to keep your hands busy. Play with a pen, coin, or rubber band. Doodle or draw things on paper.  Brush your teeth right after eating. This will help cut down the craving for the taste of tobacco after meals. You can try mouthwash too.  Try gum, breath mints, or diet candy to keep something in your mouth. IF YOU SMOKE AND WANT TO QUIT:  Do not stock up on cigarettes. Never buy a carton. Wait until one pack is finished before you buy another.  Never carry cigarettes with you at work or at home.  Keep cigarettes as far away from you as possible. Leave them with someone else.  Never carry matches or a lighter with you.  Ask yourself, "Do I need this cigarette or is this just a reflex?"  Bet with someone that you can quit. Put cigarette money in a piggy bank every morning. If you smoke, you give up the money. If you do not smoke, by the end of the week, you keep the money.  Keep trying. It takes 21 days to change a habit!  Talk to your doctor about using medicines to help you quit. These include nicotine replacement gum, lozenges, or skin patches.   This information is not intended to replace  advice given to you by your health care provider. Make sure you discuss any questions you have with your health care provider.   Document Released: 04/06/2009 Document Revised: 09/02/2011 Document Reviewed: 04/06/2009 Elsevier Interactive Patient Education 2016 Elsevier Inc. Facet Joint Block The facet joints connect the bones of the spine (vertebrae). They make it possible for you to bend, twist, and make other movements with your spine. They also prevent you from overbending, overtwisting, and making other excessive movements.  A facet joint block is  a procedure where a numbing medicine (anesthetic) is injected into a facet joint. Often, a type of anti-inflammatory medicine called a steroid is also injected. A facet joint block may be done for two reasons:   Diagnosis. A facet joint block may be done as a test to see whether neck or back pain is caused by a worn-down or infected facet joint. If the pain gets better after a facet joint block, it means the pain is probably coming from the facet joint. If the pain does not get better, it means the pain is probably not coming from the facet joint.   Therapy. A facet joint block may be done to relieve neck or back pain caused by a facet joint. A facet joint block is only done as a therapy if the pain does not improve with medicine, exercise programs, physical therapy, and other forms of pain management. LET [facility] CARE PROVIDER KNOW ABOUT:   Any allergies you have.   All medicines you are taking, including vitamins, herbs, eyedrops, and over-the-counter medicines and creams.   Previous problems you or members of your family have had with the use of anesthetics.   Any blood disorders you have had.   Other health problems you have. RISKS AND COMPLICATIONS Generally, having a facet joint block is safe. However, as with any procedure, complications can occur. Possible complications associated with having a facet joint block include:    Bleeding.   Injury to a nerve near the injection site.   Pain at the injection site.   Weakness or numbness in areas controlled by nerves near the injection site.   Infection.   Temporary fluid retention.   Allergic reaction to anesthetics or medicines used during the procedure. BEFORE THE PROCEDURE   Follow your health care provider's instructions if you are taking dietary supplements or medicines. You may need to stop taking them or reduce your dosage.   Do not take any new dietary supplements or medicines without asking your health care provider first.   Follow your health care provider's instructions about eating and drinking before the procedure. You may need to stop eating and drinking several hours before the procedure.   Arrange to have an adult drive you home after the procedure. PROCEDURE  You may need to remove your clothing and dress in an open-back gown so that your health care provider can access your spine.   The procedure will be done while you are lying on an X-ray table. Most of the time you will be asked to lie on your stomach, but you may be asked to lie in a different position if an injection will be made in your neck.   Special machines will be used to monitor your oxygen levels, heart rate, and blood pressure.   If an injection will be made in your neck, an intravenous (IV) tube will be inserted into one of your veins. Fluids and medicine will flow directly into your body through the IV tube.   The area over the facet joint where the injection will be made will be cleaned with an antiseptic soap. The surrounding skin will be covered with sterile drapes.   An anesthetic will be applied to your skin to make the injection area numb. You may feel a temporary stinging or burning sensation.   A video X-ray machine will be used to locate the joint. A contrast dye may be injected into the facet joint area to help with locating the joint.  When  the joint is located, an anesthetic medicine will be injected into the joint through the needle.   Your health care provider will ask you whether you feel pain relief. If you do feel relief, a steroid may be injected to provide pain relief for a longer period of time. If you do not feel relief or feel only partial relief, additional injections of an anesthetic may be made in other facet joints.   The needle will be removed, the skin will be cleansed, and bandages will be applied.  AFTER THE PROCEDURE   You will be observed for 15-30 minutes before being allowed to go home. Do not drive. Have an adult drive you or take a taxi or public transportation instead.   If you feel pain relief, the pain will return in several hours or days when the anesthetic wears off.   You may feel pain relief 2-14 days after the procedure. The amount of time this relief lasts varies from person to person.   It is normal to feel some tenderness over the injected area(s) for 2 days following the procedure.   If you have diabetes, you may have a temporary increase in blood sugar.   This information is not intended to replace advice given to you by your health care provider. Make sure you discuss any questions you have with your health care provider.   Document Released: 10/30/2006 Document Revised: 07/01/2014 Document Reviewed: 03/30/2012 Elsevier Interactive Patient Education 2016 [place]  What are the risk, side effects and possible complications? Generally speaking, most procedures are safe.  However, with any procedure there are risks, side effects, and the possibility of complications.  The risks and complications are dependent upon the sites that are lesioned, or the type of nerve block to be performed.  The closer the procedure is to the spine, the more serious the risks are.  Great care is taken when placing the radio frequency needles, block needles or lesioning  probes, but sometimes complications can occur. 1. Infection: Any time there is an injection through the skin, there is a risk of infection.  This is why sterile conditions are used for these blocks.  There are four possible types of infection. 1. Localized skin infection. 2. Central Nervous System Infection-This can be in the form of Meningitis, which can be deadly. 3. Epidural Infections-This can be in the form of an epidural abscess, which can cause pressure inside of the spine, causing compression of the spinal cord with subsequent paralysis. This would require an emergency surgery to decompress, and there are no guarantees that the patient would recover from the paralysis. 4. Discitis-This is an infection of the intervertebral discs.  It occurs in about 1% of discography procedures.  It is difficult to treat and it may lead to surgery.        2. Pain: the needles have to go through skin and soft tissues, will cause soreness.       3. Damage to internal structures:  The nerves to be lesioned may be near blood vessels or    other nerves which can be potentially damaged.       4. Bleeding: Bleeding is more common if the patient is taking blood thinners such as  aspirin, Coumadin, Ticiid, Plavix, etc., or if he/she have some genetic predisposition  such as hemophilia. Bleeding into the spinal canal can cause compression of the spinal  cord with subsequent paralysis.  This would require an emergency  surgery to  decompress and there are no guarantees that the patient would recover from the  paralysis.       5. Pneumothorax:  Puncturing of a lung is a possibility, every time a needle is introduced in  the area of the chest or upper back.  Pneumothorax refers to free air around the  collapsed lung(s), inside of the thoracic cavity (chest cavity).  Another two possible  complications related to a similar event would include: Hemothorax and Chylothorax.   These are variations of the Pneumothorax, where instead  of air around the collapsed  lung(s), you may have blood or chyle, respectively.       6. Spinal headaches: They may occur with any procedures in the area of the spine.       7. Persistent CSF (Cerebro-Spinal Fluid) leakage: This is a rare problem, but may occur  with prolonged intrathecal or epidural catheters either due to the formation of a fistulous  track or a dural tear.       8. Nerve damage: By working so close to the spinal cord, there is always a possibility of  nerve damage, which could be as serious as a permanent spinal cord injury with  paralysis.       9. Death:  Although rare, severe deadly allergic reactions known as "Anaphylactic  reaction" can occur to any of the medications used.      10. Worsening of the symptoms:  We can always make thing worse.  What are the chances of something like this happening? Chances of any of this occuring are extremely low.  By statistics, you have more of a chance of getting killed in a motor vehicle accident: while driving to the hospital than any of the above occurring .  Nevertheless, you should be aware that they are possibilities.  In general, it is similar to taking a shower.  Everybody knows that you can slip, hit your head and get killed.  Does that mean that you should not shower again?  Nevertheless always keep in mind that statistics do not mean anything if you happen to be on the wrong side of them.  Even if a procedure has a 1 (one) in a 1,000,000 (million) chance of going wrong, it you happen to be that one..Also, keep in mind that by statistics, you have more of a chance of having something go wrong when taking medications.  Who should not have this procedure? If you are on a blood thinning medication (e.g. Coumadin, Plavix, see list of "Blood Thinners"), or if you have an active infection going on, you should not have the procedure.  If you are taking any blood thinners, please inform your physician.  How should I prepare for this  procedure?  Do not eat or drink anything at least six hours prior to the procedure.  Bring a driver with you .  It cannot be a taxi.  Come accompanied by an adult that can drive you back, and that is strong enough to help you if your legs get weak or numb from the local anesthetic.  Take all of your medicines the morning of the procedure with just enough water to swallow them.  If you have diabetes, make sure that you are scheduled to have your procedure done first thing in the morning, whenever possible.  If you have diabetes, take only half of your insulin dose and notify our nurse that you have done so as soon as you arrive at the  clinic.  If you are diabetic, but only take blood sugar pills (oral hypoglycemic), then do not take them on the morning of your procedure.  You may take them after you have had the procedure.  Do not take aspirin or any aspirin-containing medications, at least eleven (11) days prior to the procedure.  They may prolong bleeding.  Wear loose fitting clothing that may be easy to take off and that you would not mind if it got stained with Betadine or blood.  Do not wear any jewelry or perfume  Remove any nail coloring.  It will interfere with some of our monitoring equipment.  NOTE: Remember that this is not meant to be interpreted as a complete list of all possible complications.  Unforeseen problems may occur.  BLOOD THINNERS The following drugs contain aspirin or other products, which can cause increased bleeding during surgery and should not be taken for 2 weeks prior to and 1 week after surgery.  If you should need take something for relief of minor pain, you may take acetaminophen which is found in Tylenol,m Datril, Anacin-3 and Panadol. It is not blood thinner. The products listed below are.  Do not take any of the products listed below in addition to any listed on your instruction sheet.  A.P.C or A.P.C with Codeine Codeine Phosphate Capsules #3  Ibuprofen Ridaura  ABC compound Congesprin Imuran rimadil  Advil Cope Indocin Robaxisal  Alka-Seltzer Effervescent Pain Reliever and Antacid Coricidin or Coricidin-D  Indomethacin Rufen  Alka-Seltzer plus Cold Medicine Cosprin Ketoprofen S-A-C Tablets  Anacin Analgesic Tablets or Capsules Coumadin Korlgesic Salflex  Anacin Extra Strength Analgesic tablets or capsules CP-2 Tablets Lanoril Salicylate  Anaprox Cuprimine Capsules Levenox Salocol  Anexsia-D Dalteparin Magan Salsalate  Anodynos Darvon compound Magnesium Salicylate Sine-off  Ansaid Dasin Capsules Magsal Sodium Salicylate  Anturane Depen Capsules Marnal Soma  APF Arthritis pain formula Dewitt's Pills Measurin Stanback  Argesic Dia-Gesic Meclofenamic Sulfinpyrazone  Arthritis Bayer Timed Release Aspirin Diclofenac Meclomen Sulindac  Arthritis pain formula Anacin Dicumarol Medipren Supac  Analgesic (Safety coated) Arthralgen Diffunasal Mefanamic Suprofen  Arthritis Strength Bufferin Dihydrocodeine Mepro Compound Suprol  Arthropan liquid Dopirydamole Methcarbomol with Aspirin Synalgos  ASA tablets/Enseals Disalcid Micrainin Tagament  Ascriptin Doan's Midol Talwin  Ascriptin A/D Dolene Mobidin Tanderil  Ascriptin Extra Strength Dolobid Moblgesic Ticlid  Ascriptin with Codeine Doloprin or Doloprin with Codeine Momentum Tolectin  Asperbuf Duoprin Mono-gesic Trendar  Aspergum Duradyne Motrin or Motrin IB Triminicin  Aspirin plain, buffered or enteric coated Durasal Myochrisine Trigesic  Aspirin Suppositories Easprin Nalfon Trillsate  Aspirin with Codeine Ecotrin Regular or Extra Strength Naprosyn Uracel  Atromid-S Efficin Naproxen Ursinus  Auranofin Capsules Elmiron Neocylate Vanquish  Axotal Emagrin Norgesic Verin  Azathioprine Empirin or Empirin with Codeine Normiflo Vitamin E  Azolid Emprazil Nuprin Voltaren  Bayer Aspirin plain, buffered or children's or timed BC Tablets or powders Encaprin Orgaran Warfarin Sodium   Buff-a-Comp Enoxaparin Orudis Zorpin  Buff-a-Comp with Codeine Equegesic Os-Cal-Gesic   Buffaprin Excedrin plain, buffered or Extra Strength Oxalid   Bufferin Arthritis Strength Feldene Oxphenbutazone   Bufferin plain or Extra Strength Feldene Capsules Oxycodone with Aspirin   Bufferin with Codeine Fenoprofen Fenoprofen Pabalate or Pabalate-SF   Buffets II Flogesic Panagesic   Buffinol plain or Extra Strength Florinal or Florinal with Codeine Panwarfarin   Buf-Tabs Flurbiprofen Penicillamine   Butalbital Compound Four-way cold tablets Penicillin   Butazolidin Fragmin Pepto-Bismol   Carbenicillin Geminisyn Percodan   Carna Arthritis Reliever Geopen Persantine   Carprofen Gold's salt  Persistin   Chloramphenicol Goody's Phenylbutazone   Chloromycetin Haltrain Piroxlcam   Clmetidine heparin Plaquenil   Cllnoril Hyco-pap Ponstel   Clofibrate Hydroxy chloroquine Propoxyphen         Before stopping any of these medications, be sure to consult the physician who ordered them.  Some, such as Coumadin (Warfarin) are ordered to prevent or treat serious conditions such as "deep thrombosis", "pumonary embolisms", and other heart problems.  The amount of time that you may need off of the medication may also vary with the medication and the reason for which you were taking it.  If you are taking any of these medications, please make sure you notify your pain physician before you undergo any procedures.

## 2016-04-24 NOTE — Progress Notes (Signed)
Safety precautions to be maintained throughout the outpatient stay will include: orient to surroundings, keep bed in low position, maintain call bell within reach at all times, provide assistance with transfer out of bed and ambulation.  

## 2016-04-29 ENCOUNTER — Ambulatory Visit
Admission: RE | Admit: 2016-04-29 | Discharge: 2016-04-29 | Disposition: A | Discharge status: Home / Self Care | Payer: Medicaid Other | Source: Ambulatory Visit | Attending: Pain Medicine | Admitting: Pain Medicine

## 2016-04-29 ENCOUNTER — Ambulatory Visit (HOSPITAL_BASED_OUTPATIENT_CLINIC_OR_DEPARTMENT_OTHER): Payer: Medicaid Other | Admitting: Pain Medicine

## 2016-04-29 ENCOUNTER — Encounter: Payer: Self-pay | Admitting: Pain Medicine

## 2016-04-29 VITALS — BP 118/70 | HR 64 | Temp 96.4°F | Resp 16 | Ht 64.0 in | Wt 196.0 lb

## 2016-04-29 DIAGNOSIS — M545 Low back pain, unspecified: Secondary | ICD-10-CM

## 2016-04-29 DIAGNOSIS — M1288 Other specific arthropathies, not elsewhere classified, other specified site: Secondary | ICD-10-CM | POA: Diagnosis not present

## 2016-04-29 DIAGNOSIS — G8929 Other chronic pain: Secondary | ICD-10-CM

## 2016-04-29 DIAGNOSIS — M47816 Spondylosis without myelopathy or radiculopathy, lumbar region: Secondary | ICD-10-CM

## 2016-04-29 MED ORDER — LIDOCAINE HCL (PF) 1 % IJ SOLN
10.0000 mL | Freq: Once | INTRAMUSCULAR | Status: AC
  Administered 2016-04-29: 10 mL
  Filled 2016-04-29: qty 10

## 2016-04-29 MED ORDER — LACTATED RINGERS IV SOLN
1000.0000 mL | Freq: Once | INTRAVENOUS | Status: AC
  Administered 2016-04-29: 1000 mL via INTRAVENOUS

## 2016-04-29 MED ORDER — TRIAMCINOLONE ACETONIDE 40 MG/ML IJ SUSP
40.0000 mg | Freq: Once | INTRAMUSCULAR | Status: AC
  Administered 2016-04-29: 40 mg
  Filled 2016-04-29: qty 2

## 2016-04-29 MED ORDER — FENTANYL CITRATE (PF) 100 MCG/2ML IJ SOLN
25.0000 ug | INTRAMUSCULAR | Status: DC | PRN
Start: 2016-04-29 — End: 2016-06-05
  Administered 2016-04-29: 100 ug via INTRAVENOUS
  Filled 2016-04-29: qty 2

## 2016-04-29 MED ORDER — ROPIVACAINE HCL 2 MG/ML IJ SOLN
9.0000 mL | Freq: Once | INTRAMUSCULAR | Status: AC
Start: 2016-04-29 — End: 2016-04-29
  Administered 2016-04-29: 9 mL
  Filled 2016-04-29: qty 10

## 2016-04-29 MED ORDER — MIDAZOLAM HCL 5 MG/5ML IJ SOLN
1.0000 mg | INTRAMUSCULAR | Status: DC | PRN
  Administered 2016-04-29: 5 mg via INTRAVENOUS
  Filled 2016-04-29: qty 5

## 2016-04-29 NOTE — Progress Notes (Signed)
Patient's Name: Sara Munoz  MRN: [id number]  Referring Provider: Milinda Pointer, MD  DOB: [birth year]  PCP: [place]  DOS: 04/29/2016  Note by: Kathlen Brunswick. Dossie Arbour, MD  Service setting: Ambulatory outpatient  Location: [facility] (AMB) Pain Management Facility  Visit type: Procedure  Specialty: Interventional Pain Management  Patient type: Established   Primary Reason for Visit: Interventional Pain Management Treatment. CC: Back Pain (lower bilateral)  Procedure:  Anesthesia, Analgesia, Anxiolysis:  Type: Diagnostic Medial Branch Facet Block Region: Lumbar Level: L2, L3, L4, L5, & S1 Medial Branch Level(s) Laterality: Bilateral  Type: Local Anesthesia with Moderate (Conscious) Sedation Local Anesthetic: Lidocaine 1% Route: Intravenous (IV) IV Access: Secured Sedation: Meaningful verbal contact was maintained at all times during the procedure  Indication(s): Analgesia and Anxiety  Indications: 1. Lumbar facet syndrome (Bilateral) (L>R)   2. Chronic low back pain (Location of Primary Source of Pain) (Bilateral) (L>R)    Pain Score: Pre-procedure: 5 /10 Post-procedure: 4 /10  Pre-Procedure Assessment:  Sara Munoz is a 50 y.o. (year old), female patient, seen today for interventional treatment. She  has a past surgical history that includes ORIF femur fracture; ORIF tibia & fibula fractures; hemorroids; Ovary surgery; and Cardiac catheterization (Left, 02/22/2016).. Her primarily concern today is the Back Pain (lower bilateral) The primary encounter diagnosis was Lumbar facet syndrome (Bilateral) (L>R). A diagnosis of Chronic low back pain (Location of Primary Source of Pain) (Bilateral) (L>R) was also pertinent to this visit.     Date of Last Visit: 04/24/16 Service Provided on Last Visit: Evaluation  Coagulation Parameters Lab Results  Component Value Date   PLT 147 (L) 03/01/2016   Verification of the correct person, correct site (including marking of site),  and correct procedure were performed and confirmed by the patient.  Consent: Before the procedure and under the influence of no sedative(s), amnesic(s), or anxiolytics, the patient was informed of the treatment options, risks and possible complications. To fulfill our ethical and legal obligations, as recommended by the American Medical Association's Code of Ethics, I have informed the patient of my clinical impression; the nature and purpose of the treatment or procedure; the risks, benefits, and possible complications of the intervention; the alternatives, including doing nothing; the risk(s) and benefit(s) of the alternative treatment(s) or procedure(s); and the risk(s) and benefit(s) of doing nothing. The patient was provided information about the general risks and possible complications associated with the procedure. These may include, but are not limited to: failure to achieve desired goals, infection, bleeding, organ or nerve damage, allergic reactions, paralysis, and death. In addition, the patient was informed of those risks and complications associated to Spine-related procedures, such as failure to decrease pain; infection (i.e.: Meningitis, epidural or intraspinal abscess); bleeding (i.e.: epidural hematoma, subarachnoid hemorrhage, or any other type of intraspinal or peri-dural bleeding); organ or nerve damage (i.e.: Any type of peripheral nerve, nerve root, or spinal cord injury) with subsequent damage to sensory, motor, and/or autonomic systems, resulting in permanent pain, numbness, and/or weakness of one or several areas of the body; allergic reactions; (i.e.: anaphylactic reaction); and/or death. Furthermore, the patient was informed of those risks and complications associated with the medications. These include, but are not limited to: allergic reactions (i.e.: anaphylactic or anaphylactoid reaction(s)); adrenal axis suppression; blood sugar elevation that in diabetics may result in  ketoacidosis or comma; water retention that in patients with history of congestive heart failure may result in shortness of breath, pulmonary edema, and decompensation with resultant heart  failure; weight gain; swelling or edema; medication-induced neural toxicity; particulate matter embolism and blood vessel occlusion with resultant organ, and/or nervous system infarction; and/or aseptic necrosis of one or more joints. Finally, the patient was informed that Medicine is not an exact science; therefore, there is also the possibility of unforeseen or unpredictable risks and/or possible complications that may result in a catastrophic outcome. The patient indicated having understood very clearly. We have given the patient no guarantees and we have made no promises. Enough time was given to the patient to ask questions, all of which were answered to the patient's satisfaction. Sara Munoz has indicated that she wanted to continue with the procedure.  Consent Attestation: I, the ordering provider, attest that I have discussed with the patient the benefits, risks, side-effects, alternatives, likelihood of achieving goals, and potential problems during recovery for the procedure that I have provided informed consent.  Pre-Procedure Preparation:  Safety Precautions: Allergies reviewed. The patient was asked about blood thinners, or active infections, both of which were denied. The patient was asked to confirm the procedure and laterality, before marking the site, and again before commencing the procedure. Appropriate site, procedure, and patient were confirmed by following the Joint Commission's Universal Protocol (UP.01.01.01), in the form of a "Time Out". The patient was asked to participate by confirming the accuracy of the "Time Out" information. Patient was assessed for positional comfort and pressure points before starting the procedure. Allergies: She is allergic to levofloxacin; flexeril [cyclobenzaprine];  keflex [cephalexin]; ketorolac; toradol [ketorolac tromethamine]; tramadol; and zoloft [sertraline hcl]. Allergy Precautions: None required Infection Control Precautions: Sterile technique used. Standard Universal Precautions were taken as recommended by the Department of [facility] for Disease Control and Prevention (CDC). Standard pre-surgical skin prep was conducted. Respiratory hygiene and cough etiquette was practiced. Hand hygiene observed. Safe injection practices and needle disposal techniques followed. SDV (single dose vial) medications used. Medications properly checked for expiration dates and contaminants. Personal protective equipment (PPE) used as per protocol. Monitoring:  As per clinic protocol. Vitals:   04/29/16 0944 04/29/16 0954 04/29/16 1004 04/29/16 1014  BP: 108/87 128/82 126/75 118/70  Pulse: 79 70 68 64  Resp: 17 16 16 16   Temp:  (!) 96.4 F (35.8 C)    TempSrc:      SpO2: 96% 94% 97% 97%  Weight:      Height:      Calculated BMI: Body mass index is 33.64 kg/m. Time-out: "Time-out" completed before starting procedure, as per protocol.  Description of Procedure Process:   Time-out: "Time-out" completed before starting procedure, as per protocol. Position: Prone Target Area: For Lumbar Facet blocks, the target is the groove formed by the junction of the transverse process and superior articular process. For the L5 dorsal ramus, the target is the notch between superior articular process and sacral ala. For the S1 dorsal ramus, the target is the superior and lateral edge of the posterior S1 Sacral foramen. Approach: Paramedial approach. Area Prepped: Entire Posterior Lumbosacral Region Prepping solution: ChloraPrep (2% chlorhexidine gluconate and 70% isopropyl alcohol) Safety Precautions: Aspiration looking for blood return was conducted prior to all injections. At no point did we inject any substances, as a needle was being advanced. No attempts were made at  seeking any paresthesias. Safe injection practices and needle disposal techniques used. Medications properly checked for expiration dates. SDV (single dose vial) medications used. Description of the Procedure: Protocol guidelines were followed. The patient was placed in position over the fluoroscopy table. The  target area was identified and the area prepped in the usual manner. Skin desensitized using vapocoolant spray. Skin & deeper tissues infiltrated with local anesthetic. Appropriate amount of time allowed to pass for local anesthetics to take effect. The procedure needle was introduced through the skin, ipsilateral to the reported pain, and advanced to the target area. Employing the "Medial Branch Technique", the needles were advanced to the angle made by the superior and medial portion of the transverse process, and the lateral and inferior portion of the superior articulating process of the targeted vertebral bodies. This area is known as "Burton's Eye" or the "Eye of the [place] Dog". A procedure needle was introduced through the skin, and this time advanced to the angle made by the superior and medial border of the sacral ala, and the lateral border of the S1 vertebral body. This last needle was later repositioned at the superior and lateral border of the posterior S1 foramen. Negative aspiration confirmed. Solution injected in intermittent fashion, asking for systemic symptoms every 0.5cc of injectate. The needles were then removed and the area cleansed, making sure to leave some of the prepping solution back to take advantage of its long term bactericidal properties. EBL: None Materials & Medications Used:  Needle(s) Used: 22g - 3.5" Spinal Needle(s)   Illustration of the posterior view of the lumbar spine and the posterior neural structures. Laminae of L2 through S1 are labeled. DPRL5, dorsal primary ramus of L5; DPRS1, dorsal primary ramus of S1; DPR3, dorsal primary ramus of L3; FJ, facet  (zygapophyseal) joint L3-L4; I, inferior articular process of L4; LB1, lateral branch of dorsal primary ramus of L1; IAB, inferior articular branches from L3 medial branch (supplies L4-L5 facet joint); IBP, intermediate branch plexus; MB3, medial branch of dorsal primary ramus of L3; NR3, third lumbar nerve root; S, superior articular process of L5; SAB, superior articular branches from L4 (supplies L4-5 facet joint also); TP3, transverse process of L3.  Imaging Guidance (Spinal):  Type of Imaging Technique: Fluoroscopy Guidance (Spinal) Indication(s): Assistance in needle guidance and placement for procedures requiring needle placement in or near specific anatomical locations not easily accessible without such assistance. Exposure Time: Please see nurses notes. Contrast: None used. Fluoroscopic Guidance: I was personally present during the use of fluoroscopy. "Tunnel Vision Technique" used to obtain the best possible view of the target area. Parallax error corrected before commencing the procedure. "Direction-depth-direction" technique used to introduce the needle under continuous pulsed fluoroscopy. Once target was reached, antero-posterior, oblique, and lateral fluoroscopic projection used confirm needle placement in all planes. Images permanently stored in EMR. Interpretation: No contrast injected. I personally interpreted the imaging intraoperatively. Adequate needle placement confirmed in multiple planes. Permanent images saved into the patient's record.  Antibiotic Prophylaxis:  Indication(s): No indications identified. Type:  Antibiotics Given (last 72 hours)    None      Post-operative Assessment:  Complications: No immediate post-treatment complications observed by team, or reported by patient. Disposition: The patient tolerated the entire procedure well. A repeat set of vitals were taken after the procedure and the patient was kept under observation following institutional policy, for  this type of procedure. Post-procedural neurological assessment was performed, showing return to baseline, prior to discharge. The patient was provided with post-procedure discharge instructions, including a section on how to identify potential problems. Should any problems arise concerning this procedure, the patient was given instructions to immediately contact us, at any time, without hesitation. In any case, we plan to contact the patient  by telephone for a follow-up status report regarding this interventional procedure. Comments:  No additional relevant information.  Plan of Care  Discharge to: Discharge home  Medications ordered for procedure: Meds ordered this encounter  Medications  . fentaNYL (SUBLIMAZE) injection 25-50 mcg    Make sure Narcan is available in the pyxis when using this medication. In the event of respiratory depression (RR< 8/min): Titrate NARCAN (naloxone) in increments of 0.1 to 0.2 mg IV at 2-3 minute intervals, until desired degree of reversal.  . lactated ringers infusion 1,000 mL  . midazolam (VERSED) 5 MG/5ML injection 1-2 mg    Make sure Flumazenil is available in the pyxis when using this medication. If oversedation occurs, administer 0.2 mg IV over 15 sec. If after 45 sec no response, administer 0.2 mg again over 1 min; may repeat at 1 min intervals; not to exceed 4 doses (1 mg)  . triamcinolone acetonide (KENALOG-40) injection 40 mg  . lidocaine (PF) (XYLOCAINE) 1 % injection 10 mL  . ropivacaine (PF) 2 mg/ml (0.2%) (NAROPIN) epidural 9 mL   Medications administered: (For more details, see medical record) We administered fentaNYL, lactated ringers, midazolam, triamcinolone acetonide, lidocaine (PF), and ropivacaine (PF) 2 mg/ml (0.2%). Lab-work, Procedure(s), & Referral(s) Ordered: Orders Placed This Encounter  Procedures  . DG C-Arm 1-60 Min-No Report   Imaging Ordered: No results found for this or any previous visit. New Prescriptions    OXYCODONE-ACETAMINOPHEN (PERCOCET) 7.5-325 MG TABLET    Take 1 tablet by mouth every 6 (six) hours as needed for severe pain.   Physician-requested Follow-up:  Return in about 2 weeks (around 05/13/2016) for Post-Procedure evaluation.  Future Appointments Date Time Provider [place]  05/24/2016 10:40 AM [facility] 2 [facility]  06/05/2016 1:00 PM Milinda Pointer, MD [facility] None   Primary Care Physician: [place] Location: [facility] Outpatient Pain Management Facility Note by: Kathlen Brunswick. Dossie Arbour, M.D, DABA, DABAPM, DABPM, DABIPP, FIPP  Disclaimer:  Medicine is not an exact science. The only guarantee in medicine is that nothing is guaranteed. It is important to note that the decision to proceed with this intervention was based on the information collected from the patient. The Data and conclusions were drawn from the patient's questionnaire, the interview, and the physical examination. Because the information was provided in large part by the patient, it cannot be guaranteed that it has not been purposely or unconsciously manipulated. Every effort has been made to obtain as much relevant data as possible for this evaluation. It is important to note that the conclusions that lead to this procedure are derived in large part from the available data. Always take into account that the treatment will also be dependent on availability of resources and existing treatment guidelines, considered by other Pain Management Practitioners as being common knowledge and practice, at the time of the intervention. For Medico-Legal purposes, it is also important to point out that variation in procedural techniques and pharmacological choices are the acceptable norm. The indications, contraindications, technique, and results of the above procedure should only be interpreted and judged by a Board-Certified Interventional Pain Specialist with extensive familiarity and expertise in the same exact  procedure and technique. Attempts at providing opinions without similar or greater experience and expertise than that of the treating physician will be considered as inappropriate and unethical, and shall result in a formal complaint to the state medical board and applicable specialty societies.  Instructions provided at this appointment: Patient Instructions  Pain Management Discharge Instructions  General Discharge Instructions :  If you need to reach your doctor call: Monday-Friday 8:00 am - 4:00 pm at [phone] or toll free [phone].  After clinic hours [phone] to have operator reach doctor.  Bring all of your medication bottles to all your appointments in the pain clinic.  To cancel or reschedule your appointment with Pain Management please remember to call 24 hours in advance to avoid a fee.  Refer to the educational materials which you have been given on: General Risks, I had my Procedure. Discharge Instructions, Post Sedation.  Post Procedure Instructions:  The drugs you were given will stay in your system until tomorrow, so for the next 24 hours you should not drive, make any legal decisions or drink any alcoholic beverages.  You may eat anything you prefer, but it is better to start with liquids then soups and crackers, and gradually work up to solid foods.  Please notify your doctor immediately if you have any unusual bleeding, trouble breathing or pain that is not related to your normal pain.  Depending on the type of procedure that was done, some parts of your body may feel week and/or numb.  This usually clears up by tonight or the next day.  Walk with the use of an assistive device or accompanied by an adult for the 24 hours.  You may use ice on the affected area for the first 24 hours.  Put ice in a Ziploc bag and cover with a towel and place against area 15 minutes on 15 minutes off.  You may switch to heat after 24 hours.Facet Joint Block, Care After Refer  to this sheet in the next few weeks. These instructions provide you with information on caring for yourself after your procedure. Your health care provider may also give you more specific instructions. Your treatment has been planned according to current medical practices, but problems sometimes occur. Call your health care provider if you have any problems or questions after your procedure. HOME CARE INSTRUCTIONS   Keep track of the amount of pain relief you feel and how long it lasts.  Limit pain medicine within the first 4-6 hours after the procedure as directed by your health care provider.  Resume taking dietary supplements and medicines as directed by your health care provider.  You may resume your regular diet.  Do not apply heat near or over the injection site(s) for 24 hours.   Do not take a bath or soak in water (such as a pool or lake) for 24 hours.  Do not drive for 24 hours unless approved by your health care provider.  Avoid strenuous activity for 24 hours.  Remove your bandages the morning after the procedure.   If the injection site is tender, applying an ice pack may relieve some tenderness. To do this:  Put ice in a bag.  Place a towel between your skin and the bag.  Leave the ice on for 15-20 minutes, 3-4 times a day.  Keep follow-up appointments as directed by your health care provider. SEEK MEDICAL CARE IF:   Your pain is not controlled by your medicines.   There is drainage from the injection site.   There is significant bleeding or swelling at the injection site.  You have diabetes and your blood sugar is above 180 mg/dL. SEEK IMMEDIATE MEDICAL CARE IF:   You develop a fever of 101F (38.3C) or greater.   You have worsening pain or swelling around the injection site.  You have red streaking around the injection site.   You develop severe pain that is not controlled by your medicines.   You develop a headache, stiff neck, nausea, or  vomiting.   Your eyes become very sensitive to light.   You have weakness, paralysis, or tingling in your arms or legs that was not present before the procedure.   You develop difficulty urinating or breathing.    This information is not intended to replace advice given to you by your health care provider. Make sure you discuss any questions you have with your health care provider.   Document Released: 05/27/2012 Document Revised: 07/01/2014 Document Reviewed: 05/27/2012 Elsevier Interactive Patient Education 2016 [employer] Facet Joint Block The facet joints connect the bones of the spine (vertebrae). They make it possible for you to bend, twist, and make other movements with your spine. They also prevent you from overbending, overtwisting, and making other excessive movements.  A facet joint block is a procedure where a numbing medicine (anesthetic) is injected into a facet joint. Often, a type of anti-inflammatory medicine called a steroid is also injected. A facet joint block may be done for two reasons:   Diagnosis. A facet joint block may be done as a test to see whether neck or back pain is caused by a worn-down or infected facet joint. If the pain gets better after a facet joint block, it means the pain is probably coming from the facet joint. If the pain does not get better, it means the pain is probably not coming from the facet joint.   Therapy. A facet joint block may be done to relieve neck or back pain caused by a facet joint. A facet joint block is only done as a therapy if the pain does not improve with medicine, exercise programs, physical therapy, and other forms of pain management. LET [facility] CARE PROVIDER KNOW ABOUT:   Any allergies you have.   All medicines you are taking, including vitamins, herbs, eyedrops, and over-the-counter medicines and creams.   Previous problems you or members of your family have had with the use of anesthetics.   Any blood  disorders you have had.   Other health problems you have. RISKS AND COMPLICATIONS Generally, having a facet joint block is safe. However, as with any procedure, complications can occur. Possible complications associated with having a facet joint block include:   Bleeding.   Injury to a nerve near the injection site.   Pain at the injection site.   Weakness or numbness in areas controlled by nerves near the injection site.   Infection.   Temporary fluid retention.   Allergic reaction to anesthetics or medicines used during the procedure. BEFORE THE PROCEDURE   Follow your health care provider's instructions if you are taking dietary supplements or medicines. You may need to stop taking them or reduce your dosage.   Do not take any new dietary supplements or medicines without asking your health care provider first.   Follow your health care provider's instructions about eating and drinking before the procedure. You may need to stop eating and drinking several hours before the procedure.   Arrange to have an adult drive you home after the procedure. PROCEDURE  You may need to remove your clothing and dress in an open-back gown so that your health care provider can access your spine.   The procedure will be done while you are lying on an X-ray table. Most of the time you will  be asked to lie on your stomach, but you may be asked to lie in a different position if an injection will be made in your neck.   Special machines will be used to monitor your oxygen levels, heart rate, and blood pressure.   If an injection will be made in your neck, an intravenous (IV) tube will be inserted into one of your veins. Fluids and medicine will flow directly into your body through the IV tube.   The area over the facet joint where the injection will be made will be cleaned with an antiseptic soap. The surrounding skin will be covered with sterile drapes.   An anesthetic will be  applied to your skin to make the injection area numb. You may feel a temporary stinging or burning sensation.   A video X-ray machine will be used to locate the joint. A contrast dye may be injected into the facet joint area to help with locating the joint.   When the joint is located, an anesthetic medicine will be injected into the joint through the needle.   Your health care provider will ask you whether you feel pain relief. If you do feel relief, a steroid may be injected to provide pain relief for a longer period of time. If you do not feel relief or feel only partial relief, additional injections of an anesthetic may be made in other facet joints.   The needle will be removed, the skin will be cleansed, and bandages will be applied.  AFTER THE PROCEDURE   You will be observed for 15-30 minutes before being allowed to go home. Do not drive. Have an adult drive you or take a taxi or public transportation instead.   If you feel pain relief, the pain will return in several hours or days when the anesthetic wears off.   You may feel pain relief 2-14 days after the procedure. The amount of time this relief lasts varies from person to person.   It is normal to feel some tenderness over the injected area(s) for 2 days following the procedure.   If you have diabetes, you may have a temporary increase in blood sugar.   This information is not intended to replace advice given to you by your health care provider. Make sure you discuss any questions you have with your health care provider.   Document Released: 10/30/2006 Document Revised: 07/01/2014 Document Reviewed: 03/30/2012 Elsevier Interactive Patient Education [employer].

## 2016-04-29 NOTE — Patient Instructions (Signed)
Pain Management Discharge Instructions  General Discharge Instructions :  If you need to reach your doctor call: Monday-Friday 8:00 am - 4:00 pm at [phone] or toll free [phone].  After clinic hours [phone] to have operator reach doctor.  Bring all of your medication bottles to all your appointments in the pain clinic.  To cancel or reschedule your appointment with Pain Management please remember to call 24 hours in advance to avoid a fee.  Refer to the educational materials which you have been given on: General Risks, I had my Procedure. Discharge Instructions, Post Sedation.  Post Procedure Instructions:  The drugs you were given will stay in your system until tomorrow, so for the next 24 hours you should not drive, make any legal decisions or drink any alcoholic beverages.  You may eat anything you prefer, but it is better to start with liquids then soups and crackers, and gradually work up to solid foods.  Please notify your doctor immediately if you have any unusual bleeding, trouble breathing or pain that is not related to your normal pain.  Depending on the type of procedure that was done, some parts of your body may feel week and/or numb.  This usually clears up by tonight or the next day.  Walk with the use of an assistive device or accompanied by an adult for the 24 hours.  You may use ice on the affected area for the first 24 hours.  Put ice in a Ziploc bag and cover with a towel and place against area 15 minutes on 15 minutes off.  You may switch to heat after 24 hours.Facet Joint Block, Care After Refer to this sheet in the next few weeks. These instructions provide you with information on caring for yourself after your procedure. Your health care provider may also give you more specific instructions. Your treatment has been planned according to current medical practices, but problems sometimes occur. Call your health care provider if you have any problems or  questions after your procedure. HOME CARE INSTRUCTIONS   Keep track of the amount of pain relief you feel and how long it lasts.  Limit pain medicine within the first 4-6 hours after the procedure as directed by your health care provider.  Resume taking dietary supplements and medicines as directed by your health care provider.  You may resume your regular diet.  Do not apply heat near or over the injection site(s) for 24 hours.   Do not take a bath or soak in water (such as a pool or lake) for 24 hours.  Do not drive for 24 hours unless approved by your health care provider.  Avoid strenuous activity for 24 hours.  Remove your bandages the morning after the procedure.   If the injection site is tender, applying an ice pack may relieve some tenderness. To do this:  Put ice in a bag.  Place a towel between your skin and the bag.  Leave the ice on for 15-20 minutes, 3-4 times a day.  Keep follow-up appointments as directed by your health care provider. SEEK MEDICAL CARE IF:   Your pain is not controlled by your medicines.   There is drainage from the injection site.   There is significant bleeding or swelling at the injection site.  You have diabetes and your blood sugar is above 180 mg/dL. SEEK IMMEDIATE MEDICAL CARE IF:   You develop a fever of 101F (38.3C) or greater.   You have worsening pain or swelling around   the injection site.   You have red streaking around the injection site.   You develop severe pain that is not controlled by your medicines.   You develop a headache, stiff neck, nausea, or vomiting.   Your eyes become very sensitive to light.   You have weakness, paralysis, or tingling in your arms or legs that was not present before the procedure.   You develop difficulty urinating or breathing.    This information is not intended to replace advice given to you by your health care provider. Make sure you discuss any questions you have  with your health care provider.   Document Released: 05/27/2012 Document Revised: 07/01/2014 Document Reviewed: 05/27/2012 Elsevier Interactive Patient Education 2016 Elsevier Inc. Facet Joint Block The facet joints connect the bones of the spine (vertebrae). They make it possible for you to bend, twist, and make other movements with your spine. They also prevent you from overbending, overtwisting, and making other excessive movements.  A facet joint block is a procedure where a numbing medicine (anesthetic) is injected into a facet joint. Often, a type of anti-inflammatory medicine called a steroid is also injected. A facet joint block may be done for two reasons:   Diagnosis. A facet joint block may be done as a test to see whether neck or back pain is caused by a worn-down or infected facet joint. If the pain gets better after a facet joint block, it means the pain is probably coming from the facet joint. If the pain does not get better, it means the pain is probably not coming from the facet joint.   Therapy. A facet joint block may be done to relieve neck or back pain caused by a facet joint. A facet joint block is only done as a therapy if the pain does not improve with medicine, exercise programs, physical therapy, and other forms of pain management. LET YOUR HEALTH CARE PROVIDER KNOW ABOUT:   Any allergies you have.   All medicines you are taking, including vitamins, herbs, eyedrops, and over-the-counter medicines and creams.   Previous problems you or members of your family have had with the use of anesthetics.   Any blood disorders you have had.   Other health problems you have. RISKS AND COMPLICATIONS Generally, having a facet joint block is safe. However, as with any procedure, complications can occur. Possible complications associated with having a facet joint block include:   Bleeding.   Injury to a nerve near the injection site.   Pain at the injection site.    Weakness or numbness in areas controlled by nerves near the injection site.   Infection.   Temporary fluid retention.   Allergic reaction to anesthetics or medicines used during the procedure. BEFORE THE PROCEDURE   Follow your health care provider's instructions if you are taking dietary supplements or medicines. You may need to stop taking them or reduce your dosage.   Do not take any new dietary supplements or medicines without asking your health care provider first.   Follow your health care provider's instructions about eating and drinking before the procedure. You may need to stop eating and drinking several hours before the procedure.   Arrange to have an adult drive you home after the procedure. PROCEDURE  You may need to remove your clothing and dress in an open-back gown so that your health care provider can access your spine.   The procedure will be done while you are lying on an X-ray table. Most   of the time you will be asked to lie on your stomach, but you may be asked to lie in a different position if an injection will be made in your neck.   Special machines will be used to monitor your oxygen levels, heart rate, and blood pressure.   If an injection will be made in your neck, an intravenous (IV) tube will be inserted into one of your veins. Fluids and medicine will flow directly into your body through the IV tube.   The area over the facet joint where the injection will be made will be cleaned with an antiseptic soap. The surrounding skin will be covered with sterile drapes.   An anesthetic will be applied to your skin to make the injection area numb. You may feel a temporary stinging or burning sensation.   A video X-ray machine will be used to locate the joint. A contrast dye may be injected into the facet joint area to help with locating the joint.   When the joint is located, an anesthetic medicine will be injected into the joint through the  needle.   Your health care provider will ask you whether you feel pain relief. If you do feel relief, a steroid may be injected to provide pain relief for a longer period of time. If you do not feel relief or feel only partial relief, additional injections of an anesthetic may be made in other facet joints.   The needle will be removed, the skin will be cleansed, and bandages will be applied.  AFTER THE PROCEDURE   You will be observed for 15-30 minutes before being allowed to go home. Do not drive. Have an adult drive you or take a taxi or public transportation instead.   If you feel pain relief, the pain will return in several hours or days when the anesthetic wears off.   You may feel pain relief 2-14 days after the procedure. The amount of time this relief lasts varies from person to person.   It is normal to feel some tenderness over the injected area(s) for 2 days following the procedure.   If you have diabetes, you may have a temporary increase in blood sugar.   This information is not intended to replace advice given to you by your health care provider. Make sure you discuss any questions you have with your health care provider.   Document Released: 10/30/2006 Document Revised: 07/01/2014 Document Reviewed: 03/30/2012 Elsevier Interactive Patient Education 2016 Elsevier Inc.  

## 2016-04-29 NOTE — Progress Notes (Signed)
Safety precautions to be maintained throughout the outpatient stay will include: orient to surroundings, keep bed in low position, maintain call bell within reach at all times, provide assistance with transfer out of bed and ambulation.  

## 2016-04-30 ENCOUNTER — Emergency Department
Admission: EM | Admit: 2016-04-30 | Discharge: 2016-04-30 | Disposition: A | Discharge status: Home / Self Care | Payer: Medicaid Other | Attending: Emergency Medicine | Admitting: Emergency Medicine

## 2016-04-30 ENCOUNTER — Encounter: Payer: Self-pay | Admitting: Emergency Medicine

## 2016-04-30 DIAGNOSIS — J449 Chronic obstructive pulmonary disease, unspecified: Secondary | ICD-10-CM | POA: Insufficient documentation

## 2016-04-30 DIAGNOSIS — F17210 Nicotine dependence, cigarettes, uncomplicated: Secondary | ICD-10-CM | POA: Insufficient documentation

## 2016-04-30 DIAGNOSIS — Z79899 Other long term (current) drug therapy: Secondary | ICD-10-CM | POA: Insufficient documentation

## 2016-04-30 DIAGNOSIS — I2510 Atherosclerotic heart disease of native coronary artery without angina pectoris: Secondary | ICD-10-CM | POA: Insufficient documentation

## 2016-04-30 DIAGNOSIS — I10 Essential (primary) hypertension: Secondary | ICD-10-CM | POA: Diagnosis not present

## 2016-04-30 DIAGNOSIS — M549 Dorsalgia, unspecified: Secondary | ICD-10-CM

## 2016-04-30 DIAGNOSIS — Z7982 Long term (current) use of aspirin: Secondary | ICD-10-CM | POA: Insufficient documentation

## 2016-04-30 DIAGNOSIS — G8929 Other chronic pain: Secondary | ICD-10-CM | POA: Diagnosis not present

## 2016-04-30 DIAGNOSIS — M545 Low back pain: Secondary | ICD-10-CM | POA: Diagnosis present

## 2016-04-30 HISTORY — DX: Liver disease, unspecified: K76.9

## 2016-04-30 MED ORDER — OXYCODONE-ACETAMINOPHEN 7.5-325 MG PO TABS: 1.0000 | ORAL_TABLET | Freq: Four times a day (QID) | ORAL | 0 refills | Status: DC | PRN

## 2016-04-30 MED ORDER — HYDROMORPHONE HCL 1 MG/ML IJ SOLN
1.0000 mg | Freq: Once | INTRAMUSCULAR | Status: AC
  Administered 2016-04-30: 1 mg via INTRAMUSCULAR
  Filled 2016-04-30: qty 1

## 2016-04-30 NOTE — ED Provider Notes (Signed)
[facility] Emergency Department Provider Note   ____________________________________________   First MD Initiated Contact with Patient 04/30/16 1241     (approximate)  I have reviewed the triage vital signs and the nursing notes.   HISTORY  Chief Complaint Back Pain    HPI Sara Munoz is a 50 y.o. female patient complaining of low back pain. Patient had facet injections yesterday by pain clinic. Patient state unable to sleep last night secondary to pain. Patient is a pain contract with the clinic and was told to follow-up in the emergency room until such contract was issued. patient is rating the pain as a 10 over 10. patient denies any radicular component to this pain. patient denies any bladder or bowel dysfunction.   Past Medical History:  Diagnosis Date  . Anxiety   . Atypical chest pain 08/02/2015  . CAD (coronary artery disease)   . Chronic back pain   . COPD (chronic obstructive pulmonary disease) ([place])   . Coronary artery disease    a. Mild-non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/2014  . GERD (gastroesophageal reflux disease)   . Hypercholesteremia   . Hypertension   . Hypokalemia   . Hypomagnesemia 01/04/2014  . Liver disease   . MI (myocardial infarction)   . Osteoarthritis     Patient Active Problem List   Diagnosis Date Noted  . Carotid artery stenosis 02/27/2016  . Chronic constipation 02/27/2016  . Chronic nausea 02/27/2016  . Coronary artery disease 02/27/2016  . Hypercholesterolemia 02/27/2016  . Osteoarthritis 02/27/2016  . PTSD (post-traumatic stress disorder) 02/27/2016  . TIA (transient ischemic attack) 02/27/2016  . Chronic pain 02/27/2016  . Long term current use of opiate analgesic 02/27/2016  . Long term prescription opiate use 02/27/2016  . Opiate use 02/27/2016  . Encounter for therapeutic drug level monitoring 02/27/2016  . Encounter for pain management consult 02/27/2016  . Chronic hip pain (Location of Tertiary  source of pain) (Bilateral) (L>R) 02/27/2016  . Chronic knee pain (Bilateral) (L>R) 02/27/2016  . Chronic shoulder pain (Bilateral) (L>R) 02/27/2016  . Chronic sacroiliac joint pain (Bilateral) (L>R) 02/27/2016  . Chronic low back pain (Location of Primary Source of Pain) (Bilateral) (L>R) 02/27/2016  . Chronic lower extremity pain (Location of Secondary source of pain) (Bilateral) (L>R) 02/27/2016  . Osteoarthritis of hip (Bilateral) (L>R) 02/27/2016  . Chronic neck pain (posterior midline) (Bilateral) (L>R) 02/27/2016  . Cervicogenic headache (Bilateral) (L>R) 02/27/2016  . Occipital headache (Bilateral) (L>R) 02/27/2016  . Chronic upper extremity pain (Bilateral) (L>R) 02/27/2016  . Chronic Cervical radicular pain (Bilateral) (L>R) 02/27/2016  . Chronic lumbar radicular pain (Right) (L5 dermatome) 02/27/2016  . Lumbar facet syndrome (Bilateral) (L>R) 02/27/2016  . Long term prescription benzodiazepine use 02/27/2016  . Hypertension   . COPD (chronic obstructive pulmonary disease) ([place]) 10/09/2015  . GERD (gastroesophageal reflux disease) 10/09/2015  . Non-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/2014 08/02/2015  . Hyperlipidemia 08/02/2015  . Costochondritis 08/02/2015  . 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 GAD (generalized anxiety disorder) 09/03/2014  . Narcotic dependence ([place]) 09/03/2014  . PVD (peripheral vascular disease) ([place]) 09/03/2014  . Smoker 09/03/2014  . Cigarette nicotine dependence with nicotine-induced disorder 07/08/2014  . Anemia of chronic disease 03/19/2014  . Narcotic abuse, continuous 03/19/2014  . Polysubstance abuse 03/19/2014  . Hypomagnesemia 01/04/2014  . Chronic cough 09/04/2011  . Sleep apnea, obstructive 09/04/2011    Past Surgical History:  Procedure Laterality Date  . CARDIAC CATHETERIZATION Left 02/22/2016   Procedure: Left  Heart Cath and Coronary Angiography;  Surgeon: Yolonda Kida, MD;  Location: [facility];  Service: Cardiovascular;   Laterality: Left;  . hemorroids    . ORIF FEMUR FRACTURE    . ORIF TIBIA & FIBULA FRACTURES    . OVARY SURGERY      Prior to Admission medications   Medication Sig Start Date End Date Taking? Authorizing Provider  albuterol (PROAIR HFA) 108 (90 Base) MCG/ACT inhaler INHALE 2 PUFFS BY MOUTH EVERY 6 HOURS AS NEEDED 01/25/15   Historical Provider, MD  albuterol (PROVENTIL HFA;VENTOLIN HFA) 108 (90 Base) MCG/ACT inhaler Inhale 2 puffs into the lungs every 4 (four) hours as needed for wheezing or shortness of breath. 08/03/15   Demetrios Loll, MD  aspirin EC 81 MG tablet Take 81 mg by mouth daily.    Historical Provider, MD  atorvastatin (LIPITOR) 20 MG tablet Take 20 mg by mouth at bedtime.    Historical Provider, MD  budesonide-formoterol (SYMBICORT) 160-4.5 MCG/ACT inhaler Inhale 2 puffs into the lungs 2 (two) times daily. 05/31/15   Gregor Hams, MD  busPIRone (BUSPAR) 7.5 MG tablet Take 7.5 mg by mouth 2 (two) times daily.    Historical Provider, MD  dexlansoprazole (DEXILANT) 60 MG capsule Take 60 mg by mouth daily.    Historical Provider, MD  docusate sodium (COLACE) 100 MG capsule Take 200 mg by mouth 2 (two) times daily as needed for mild constipation.    Historical Provider, MD  gabapentin (NEURONTIN) 300 MG capsule Take 2 capsules (600 mg total) by mouth 3 (three) times daily. 08/03/15   Demetrios Loll, MD  linaclotide [facility]) 290 MCG CAPS capsule Take 290 mcg by mouth daily before breakfast.    Historical Provider, MD  lisinopril (PRINIVIL,ZESTRIL) 20 MG tablet Take 40 mg by mouth daily.     Historical Provider, MD  Magnesium Oxide 500 MG CAPS Take 1 capsule (500 mg total) by mouth 2 (two) times daily at 8 am and 10 pm. 04/24/16   Milinda Pointer, MD  metoprolol tartrate (LOPRESSOR) 25 MG tablet Take 1 tablet (25 mg total) by mouth 2 (two) times daily. 08/03/15   Demetrios Loll, MD  nitroGLYCERIN (NITROSTAT) 0.4 MG SL tablet Place 1 tablet (0.4 mg total) under the tongue every 5 (five) minutes x 3 doses  as needed for chest pain. 08/03/15   Demetrios Loll, MD  oxyCODONE-acetaminophen (PERCOCET) 7.5-325 MG tablet Take 1 tablet by mouth every 6 (six) hours as needed for severe pain. 04/30/16   Sable Feil, [name]  potassium chloride (KLOR-CON) 20 MEQ packet Take by mouth.    Historical Provider, MD  RANEXA 500 MG 12 hr tablet Take 1 tablet (500 mg total) by mouth 2 (two) times daily. 08/03/15   Demetrios Loll, MD  tiZANidine (ZANAFLEX) 4 MG tablet Take 2 tablets (8 mg total) by mouth 3 (three) times daily. Patient taking differently: Take 4 mg by mouth 3 (three) times daily.  02/05/16   Victorino Dike, FNP    Allergies Levofloxacin; Flexeril [cyclobenzaprine]; Keflex [cephalexin]; Ketorolac; Toradol [ketorolac tromethamine]; Tramadol; and Zoloft [sertraline hcl]  Family History  Problem Relation Age of Onset  . Heart disease Mother   . Lung cancer Mother   . Ovarian cancer Mother     Social History Social History  Substance Use Topics  . Smoking status: Current Every Day Smoker    Packs/day: 1.00    Types: Cigarettes  . Smokeless tobacco: Never Used  . Alcohol use 0.0 oz/week  Comment: drinks occasionally    Review of Systems Constitutional: No fever/chills Eyes: No visual changes. ENT: No sore throat. Cardiovascular: Denies chest pain. Respiratory: Denies shortness of breath. Gastrointestinal: No abdominal pain.  No nausea, no vomiting.  No diarrhea.  No constipation. Genitourinary: Negative for dysuria. Musculoskeletal:Chronic back pain.  Skin: Negative for rash. Neurological: Negative for headaches, focal weakness or numbness.    ____________________________________________   PHYSICAL EXAM:  VITAL SIGNS: ED Triage Vitals  Enc Vitals Group     BP 04/30/16 1209 (!) 149/96     Pulse Rate 04/30/16 1209 71     Resp 04/30/16 1209 18     Temp 04/30/16 1209 98 F (36.7 C)     Temp Source 04/30/16 1209 Oral     SpO2 04/30/16 1209 95 %     Weight 04/30/16 1210 194 lb (88 kg)      Height 04/30/16 1210 5\' 4"  (1.626 m)     Head Circumference --      Peak Flow --      Pain Score 04/30/16 1210 10     Pain Loc --      Pain Edu? --      Excl. in [place]? --     Constitutional: Alert and oriented. Well appearing and in no acute distress. Eyes: Conjunctivae are normal. PERRL. EOMI. Head: Atraumatic. Nose: No congestion/rhinnorhea. Mouth/Throat: Mucous membranes are moist.  Oropharynx non-erythematous. Neck: No stridor.  No cervical spine tenderness to palpation. Hematological/Lymphatic/Immunilogical: No cervical lymphadenopathy. Cardiovascular: Normal rate, regular rhythm. Grossly normal heart sounds.  Good peripheral circulation. Respiratory: Normal respiratory effort.  No retractions. Lungs CTAB. Gastrointestinal: Soft and nontender. No distention. No abdominal bruits. No CVA tenderness. Musculoskeletal: No obvious spinal deformity. Patient is moderate guarding from L3-S1. Patient has decreased range of motion with flexion. Patient has negative straight leg test.  Neurologic:  Normal speech and language. No gross focal neurologic deficits are appreciated. No gait instability. Skin:  Skin is warm, dry and intact. No rash noted. Psychiatric: Mood and affect are normal. Speech and behavior are normal.  ____________________________________________   LABS (all labs ordered are listed, but only abnormal results are displayed)  Labs Reviewed - No data to display ____________________________________________  EKG   ____________________________________________  RADIOLOGY   ____________________________________________   PROCEDURES  Procedure(s) performed: None  Procedures  Critical Care performed: No  ____________________________________________   INITIAL IMPRESSION / ASSESSMENT AND PLAN / ED COURSE  Pertinent labs & imaging results that were available during my care of the patient were reviewed by me and considered in my medical decision making (see chart  for details).  Acute back pain. Patient given discharge care instructions. Patient given a prescription for Percocets. Patient advised to follow-up with the pain clinic.  Clinical Course      ____________________________________________   FINAL CLINICAL IMPRESSION(S) / ED DIAGNOSES  Final diagnoses:  Other chronic back pain      NEW MEDICATIONS STARTED DURING THIS VISIT:  New Prescriptions   OXYCODONE-ACETAMINOPHEN (PERCOCET) 7.5-325 MG TABLET    Take 1 tablet by mouth every 6 (six) hours as needed for severe pain.     Note:  This document was prepared using Dragon voice recognition software and may include unintentional dictation errors.    Sable Feil, [name] 04/30/16 1257    Lavonia Drafts, MD 04/30/16 [phone]

## 2016-04-30 NOTE — ED Triage Notes (Signed)
C/O low back pain.  Patient had injections on both sides of back yesterday by DR. Naveira at pain clinic and was told to follow up elsewhere.

## 2016-04-30 NOTE — ED Notes (Signed)
States she has chronic back pain   Had injections to back yesterday and was told to come to ED if the pain became worse  Was unable to sleep last pm  D/t increased pain  Ambulates slowly d./t pain no limp

## 2016-05-02 LAB — TOXASSURE SELECT 13 (MW), URINE

## 2016-05-07 ENCOUNTER — Ambulatory Visit: Payer: Medicaid Other

## 2016-05-07 ENCOUNTER — Other Ambulatory Visit: Payer: Medicaid Other

## 2016-05-07 ENCOUNTER — Emergency Department: Payer: Medicaid Other

## 2016-05-07 ENCOUNTER — Emergency Department
Admission: EM | Admit: 2016-05-07 | Discharge: 2016-05-07 | Disposition: A | Discharge status: Home / Self Care | Payer: Medicaid Other | Attending: Emergency Medicine | Admitting: Emergency Medicine

## 2016-05-07 DIAGNOSIS — Z7982 Long term (current) use of aspirin: Secondary | ICD-10-CM | POA: Diagnosis not present

## 2016-05-07 DIAGNOSIS — R1011 Right upper quadrant pain: Secondary | ICD-10-CM

## 2016-05-07 DIAGNOSIS — J449 Chronic obstructive pulmonary disease, unspecified: Secondary | ICD-10-CM | POA: Diagnosis not present

## 2016-05-07 DIAGNOSIS — K760 Fatty (change of) liver, not elsewhere classified: Secondary | ICD-10-CM | POA: Diagnosis not present

## 2016-05-07 DIAGNOSIS — I10 Essential (primary) hypertension: Secondary | ICD-10-CM | POA: Insufficient documentation

## 2016-05-07 DIAGNOSIS — Z79899 Other long term (current) drug therapy: Secondary | ICD-10-CM | POA: Diagnosis not present

## 2016-05-07 DIAGNOSIS — N390 Urinary tract infection, site not specified: Secondary | ICD-10-CM | POA: Diagnosis not present

## 2016-05-07 DIAGNOSIS — F17210 Nicotine dependence, cigarettes, uncomplicated: Secondary | ICD-10-CM | POA: Diagnosis not present

## 2016-05-07 DIAGNOSIS — I2510 Atherosclerotic heart disease of native coronary artery without angina pectoris: Secondary | ICD-10-CM | POA: Insufficient documentation

## 2016-05-07 LAB — URINALYSIS COMPLETE WITH MICROSCOPIC (ARMC ONLY)
Bilirubin Urine: NEGATIVE
Glucose, UA: NEGATIVE mg/dL
Hgb urine dipstick: NEGATIVE
Ketones, ur: NEGATIVE mg/dL
Nitrite: NEGATIVE
Protein, ur: NEGATIVE mg/dL
Specific Gravity, Urine: 1.006 (ref 1.005–1.030)
pH: 6 (ref 5.0–8.0)

## 2016-05-07 LAB — HEPATIC FUNCTION PANEL
ALT: 57 U/L — ABNORMAL HIGH (ref 14–54)
AST: 52 U/L — ABNORMAL HIGH (ref 15–41)
Albumin: 4.5 g/dL (ref 3.5–5.0)
Alkaline Phosphatase: 64 U/L (ref 38–126)
Bilirubin, Direct: <0.1 mg/dL — ABNORMAL LOW (ref 0.1–0.5)
Total Bilirubin: 0.9 mg/dL (ref 0.3–1.2)
Total Protein: 8.3 g/dL — ABNORMAL HIGH (ref 6.5–8.1)

## 2016-05-07 LAB — BASIC METABOLIC PANEL
Anion gap: 10 (ref 5–15)
BUN: 30 mg/dL — ABNORMAL HIGH (ref 6–20)
CO2: 19 mmol/L — ABNORMAL LOW (ref 22–32)
Calcium: 9.9 mg/dL (ref 8.9–10.3)
Chloride: 101 mmol/L (ref 101–111)
Creatinine, Ser: 1.08 mg/dL — ABNORMAL HIGH (ref 0.44–1.00)
GFR calc Af Amer: >60 mL/min (ref >60)
GFR calc non Af Amer: 60 mL/min — ABNORMAL LOW (ref >60)
Glucose, Bld: 106 mg/dL — ABNORMAL HIGH (ref 65–99)
Potassium: 5.2 mmol/L — ABNORMAL HIGH (ref 3.5–5.1)
Sodium: 130 mmol/L — ABNORMAL LOW (ref 135–145)

## 2016-05-07 LAB — CBC
HCT: 47.8 % — ABNORMAL HIGH (ref 35.0–47.0)
Hemoglobin: 15.8 g/dL (ref 12.0–16.0)
MCH: 29.9 pg (ref 26.0–34.0)
MCHC: 33.1 g/dL (ref 32.0–36.0)
MCV: 90.4 fL (ref 80.0–100.0)
Platelets: 290 10*3/uL (ref 150–440)
RBC: 5.29 MIL/uL — ABNORMAL HIGH (ref 3.80–5.20)
RDW: 15 % — ABNORMAL HIGH (ref 11.5–14.5)
WBC: 14.9 10*3/uL — ABNORMAL HIGH (ref 3.6–11.0)

## 2016-05-07 LAB — TROPONIN I: Troponin I: <0.03 ng/mL (ref <0.03)

## 2016-05-07 LAB — LIPASE, BLOOD: Lipase: 67 U/L — ABNORMAL HIGH (ref 11–51)

## 2016-05-07 MED ORDER — MORPHINE SULFATE (PF) 4 MG/ML IV SOLN
INTRAVENOUS | Status: DC
Start: 2016-05-07 — End: 2016-05-07
  Filled 2016-05-07: qty 1

## 2016-05-07 MED ORDER — ONDANSETRON HCL 4 MG/2ML IJ SOLN
4.0000 mg | Freq: Once | INTRAMUSCULAR | Status: AC
  Administered 2016-05-07: 4 mg via INTRAVENOUS
  Filled 2016-05-07: qty 2

## 2016-05-07 MED ORDER — MORPHINE SULFATE (PF) 4 MG/ML IV SOLN
INTRAVENOUS | Status: AC
  Filled 2016-05-07: qty 1

## 2016-05-07 MED ORDER — MORPHINE SULFATE (PF) 4 MG/ML IV SOLN
4.0000 mg | Freq: Once | INTRAVENOUS | Status: AC
  Administered 2016-05-07: 4 mg via INTRAVENOUS
  Filled 2016-05-07: qty 1

## 2016-05-07 MED ORDER — HYDROCODONE-ACETAMINOPHEN 5-325 MG PO TABS: 1.0000 | ORAL_TABLET | ORAL | 0 refills | Status: DC | PRN

## 2016-05-07 MED ORDER — SODIUM CHLORIDE 0.9 % IV BOLUS (SEPSIS)
1000.0000 mL | Freq: Once | INTRAVENOUS | Status: AC
Start: 2016-05-07 — End: 2016-05-07
  Administered 2016-05-07: 1000 mL via INTRAVENOUS

## 2016-05-07 MED ORDER — SULFAMETHOXAZOLE-TRIMETHOPRIM 800-160 MG PO TABS: 1.0000 | ORAL_TABLET | Freq: Two times a day (BID) | ORAL | 0 refills | Status: DC

## 2016-05-07 MED ORDER — MORPHINE SULFATE (PF) 4 MG/ML IV SOLN
4.0000 mg | Freq: Once | INTRAVENOUS | Status: AC
  Administered 2016-05-07: 4 mg via INTRAVENOUS

## 2016-05-07 NOTE — ED Triage Notes (Signed)
Pt c/o RUQ pain and left chest pain for the past 3 days.. States she has a hx of liver disease.Marland Kitchen

## 2016-05-07 NOTE — ED Notes (Signed)
Pt reports that she is having muscle spasms (has all the time) - pt also c/o left chest pain and right side pain (worse on right since 2-3 days) - reported nausea/vomiting(3-4 times in 24 hours)/diarrhea (7 loose stools in 24 hours)

## 2016-05-07 NOTE — ED Provider Notes (Signed)
[facility] Emergency Department Provider Note  Time seen: 4:24 PM  I have reviewed the triage vital signs and the nursing notes.   HISTORY  Chief Complaint Abdominal Pain (RUQ) and Chest Pain (left side)    HPI Sara Munoz is a 50 y.o. female who presents to the emergency department right upper quadrant abdominal pain. According to the patient over the past 2 weeks she has been feeling weak, with constant right-sided abdominal pain especially in the right upper quadrant. She states 2 weeks ago she was diagnosed with hepatitis C, she has appointment with a liver doctor in 2 weeks. Patient does state intermittent hematuria over the past several weeks as well denies any history of kidney stones. Denies any chest pain pressure or tightness. Currently the patient states moderate right-sided abdominal pain especially in the right upper quadrant. States nausea but denies vomiting, states she is taking Zofran at home.  Past Medical History:  Diagnosis Date  . Anxiety   . Atypical chest pain 08/02/2015  . CAD (coronary artery disease)   . Chronic back pain   . COPD (chronic obstructive pulmonary disease) ([place])   . Coronary artery disease    a. Mild-non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/2014  . GERD (gastroesophageal reflux disease)   . Hypercholesteremia   . Hypertension   . Hypokalemia   . Hypomagnesemia 01/04/2014  . Liver disease   . MI (myocardial infarction)   . Osteoarthritis     Patient Active Problem List   Diagnosis Date Noted  . Carotid artery stenosis 02/27/2016  . Chronic constipation 02/27/2016  . Chronic nausea 02/27/2016  . Coronary artery disease 02/27/2016  . Hypercholesterolemia 02/27/2016  . Osteoarthritis 02/27/2016  . PTSD (post-traumatic stress disorder) 02/27/2016  . TIA (transient ischemic attack) 02/27/2016  . Chronic pain 02/27/2016  . Long term current use of opiate analgesic 02/27/2016  . Long term prescription opiate use 02/27/2016   . Opiate use 02/27/2016  . Encounter for therapeutic drug level monitoring 02/27/2016  . Encounter for pain management consult 02/27/2016  . Chronic hip pain (Location of Tertiary source of pain) (Bilateral) (L>R) 02/27/2016  . Chronic knee pain (Bilateral) (L>R) 02/27/2016  . Chronic shoulder pain (Bilateral) (L>R) 02/27/2016  . Chronic sacroiliac joint pain (Bilateral) (L>R) 02/27/2016  . Chronic low back pain (Location of Primary Source of Pain) (Bilateral) (L>R) 02/27/2016  . Chronic lower extremity pain (Location of Secondary source of pain) (Bilateral) (L>R) 02/27/2016  . Osteoarthritis of hip (Bilateral) (L>R) 02/27/2016  . Chronic neck pain (posterior midline) (Bilateral) (L>R) 02/27/2016  . Cervicogenic headache (Bilateral) (L>R) 02/27/2016  . Occipital headache (Bilateral) (L>R) 02/27/2016  . Chronic upper extremity pain (Bilateral) (L>R) 02/27/2016  . Chronic Cervical radicular pain (Bilateral) (L>R) 02/27/2016  . Chronic lumbar radicular pain (Right) (L5 dermatome) 02/27/2016  . Lumbar facet syndrome (Bilateral) (L>R) 02/27/2016  . Long term prescription benzodiazepine use 02/27/2016  . Hypertension   . COPD (chronic obstructive pulmonary disease) ([place]) 10/09/2015  . GERD (gastroesophageal reflux disease) 10/09/2015  . Non-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/2014 08/02/2015  . Hyperlipidemia 08/02/2015  . Costochondritis 08/02/2015  . 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 GAD (generalized anxiety disorder) 09/03/2014  . Narcotic dependence ([place]) 09/03/2014  . PVD (peripheral vascular disease) ([place]) 09/03/2014  . Smoker 09/03/2014  . Cigarette nicotine dependence with nicotine-induced disorder 07/08/2014  . Anemia of chronic disease 03/19/2014  . Narcotic abuse, continuous 03/19/2014  . Polysubstance abuse 03/19/2014  . Hypomagnesemia 01/04/2014  . Chronic  cough 09/04/2011  . Sleep apnea, obstructive 09/04/2011    Past Surgical History:  Procedure Laterality Date  . CARDIAC  CATHETERIZATION Left 02/22/2016   Procedure: Left Heart Cath and Coronary Angiography;  Surgeon: Yolonda Kida, MD;  Location: [facility];  Service: Cardiovascular;  Laterality: Left;  . hemorroids    . ORIF FEMUR FRACTURE    . ORIF TIBIA & FIBULA FRACTURES    . OVARY SURGERY      Prior to Admission medications   Medication Sig Start Date End Date Taking? Authorizing Provider  albuterol (PROAIR HFA) 108 (90 Base) MCG/ACT inhaler INHALE 2 PUFFS BY MOUTH EVERY 6 HOURS AS NEEDED 01/25/15   Historical Provider, MD  albuterol (PROVENTIL HFA;VENTOLIN HFA) 108 (90 Base) MCG/ACT inhaler Inhale 2 puffs into the lungs every 4 (four) hours as needed for wheezing or shortness of breath. 08/03/15   Demetrios Loll, MD  aspirin EC 81 MG tablet Take 81 mg by mouth daily.    Historical Provider, MD  atorvastatin (LIPITOR) 20 MG tablet Take 20 mg by mouth at bedtime.    Historical Provider, MD  budesonide-formoterol (SYMBICORT) 160-4.5 MCG/ACT inhaler Inhale 2 puffs into the lungs 2 (two) times daily. 05/31/15   Gregor Hams, MD  busPIRone (BUSPAR) 7.5 MG tablet Take 7.5 mg by mouth 2 (two) times daily.    Historical Provider, MD  dexlansoprazole (DEXILANT) 60 MG capsule Take 60 mg by mouth daily.    Historical Provider, MD  docusate sodium (COLACE) 100 MG capsule Take 200 mg by mouth 2 (two) times daily as needed for mild constipation.    Historical Provider, MD  gabapentin (NEURONTIN) 300 MG capsule Take 2 capsules (600 mg total) by mouth 3 (three) times daily. 08/03/15   Demetrios Loll, MD  linaclotide [facility]) 290 MCG CAPS capsule Take 290 mcg by mouth daily before breakfast.    Historical Provider, MD  lisinopril (PRINIVIL,ZESTRIL) 20 MG tablet Take 40 mg by mouth daily.     Historical Provider, MD  Magnesium Oxide 500 MG CAPS Take 1 capsule (500 mg total) by mouth 2 (two) times daily at 8 am and 10 pm. 04/24/16   Milinda Pointer, MD  metoprolol tartrate (LOPRESSOR) 25 MG tablet Take 1 tablet (25 mg  total) by mouth 2 (two) times daily. 08/03/15   Demetrios Loll, MD  nitroGLYCERIN (NITROSTAT) 0.4 MG SL tablet Place 1 tablet (0.4 mg total) under the tongue every 5 (five) minutes x 3 doses as needed for chest pain. 08/03/15   Demetrios Loll, MD  oxyCODONE-acetaminophen (PERCOCET) 7.5-325 MG tablet Take 1 tablet by mouth every 6 (six) hours as needed for severe pain. 04/30/16   Sable Feil, [name]  potassium chloride (KLOR-CON) 20 MEQ packet Take by mouth.    Historical Provider, MD  RANEXA 500 MG 12 hr tablet Take 1 tablet (500 mg total) by mouth 2 (two) times daily. 08/03/15   Demetrios Loll, MD  tiZANidine (ZANAFLEX) 4 MG tablet Take 2 tablets (8 mg total) by mouth 3 (three) times daily. Patient taking differently: Take 4 mg by mouth 3 (three) times daily.  02/05/16   Victorino Dike, FNP    Allergies  Allergen Reactions  . Levofloxacin Hives and Swelling  . Flexeril [Cyclobenzaprine] Hives, Swelling and Other (See Comments)    Reaction:  Facial/lip swelling  Reaction:  Facial/lip swelling   . Keflex [Cephalexin] Hives  . Ketorolac   . Toradol [Ketorolac Tromethamine] Swelling and Other (See Comments)  Reaction:  Facial/tongue swelling  Reaction:  Facial/tongue swelling   . Tramadol Hives, Swelling and Other (See Comments)    Reaction:  Lip swelling  Reaction:  Lip swelling   . Zoloft [Sertraline Hcl] Swelling and Other (See Comments)    Reaction:  Tongue swelling  Reaction:  Tongue swelling     Family History  Problem Relation Age of Onset  . Heart disease Mother   . Lung cancer Mother   . Ovarian cancer Mother     Social History Social History  Substance Use Topics  . Smoking status: Current Every Day Smoker    Packs/day: 1.00    Types: Cigarettes  . Smokeless tobacco: Never Used  . Alcohol use 0.0 oz/week     Comment: drinks occasionally    Review of Systems Constitutional: Negative for fever. Cardiovascular: Negative for chest pain. Respiratory: Negative for shortness of  breath. Gastrointestinal: Right upper quadrant abdominal pain. Positive for nausea. Negative for vomiting or diarrhea. Genitourinary: Negative for dysuria positive for hematuria. Neurological: Negative for headache 10-point ROS otherwise negative.  ____________________________________________   PHYSICAL EXAM:  VITAL SIGNS: ED Triage Vitals [05/07/16 1457]  Enc Vitals Group     BP (!) 158/106     Pulse Rate 97     Resp 16     Temp 98.2 F (36.8 C)     Temp Source Oral     SpO2 97 %     Weight 190 lb (86.2 kg)     Height 5\' 4"  (1.626 m)     Head Circumference      Peak Flow      Pain Score 10     Pain Loc      Pain Edu?      Excl. in [place]?     Constitutional: Alert and oriented. Well appearing and in no distress. Eyes: Normal exam ENT   Head: Normocephalic and atraumatic.   Mouth/Throat: Mucous membranes are moist. Cardiovascular: Normal rate, regular rhythm. No murmur Respiratory: Normal respiratory effort without tachypnea nor retractions. Breath sounds are clear  Gastrointestinal: Soft, mild right mid abdominal tenderness, moderate right upper quadrant abdominal tenderness, mild epigastric tenderness, no rebound or guarding. No distention. Mild bilateral CVA tenderness although the patient states chronic back pain. Musculoskeletal: Nontender with normal range of motion in all extremities.  Neurologic:  Normal speech and language. No gross focal neurologic deficits Skin:  Skin is warm, dry and intact.  Psychiatric: Mood and affect are normal.   ____________________________________________    EKG  EKG shows normal sinus rhythm at 87 bpm, narrow QRS, normal axis, normal intervals, no ST changes. Overall normal EKG.  ____________________________________________    RADIOLOGY  Chest x-ray is negative Ultrasound consistent with hepatic steatosis ____________________________________________   INITIAL IMPRESSION / ASSESSMENT AND PLAN / ED COURSE  Pertinent  labs & imaging results that were available during my care of the patient were reviewed by me and considered in my medical decision making (see chart for details).  Patient presents to the emergency department with right upper quadrant abdominal pain also states intermittent hematuria, generalized fatigue/weakness and nausea. We will check labs, obtain an ultrasound of the right upper quadrant and close to monitor in the emergency department. We'll treat the patient's pain and nausea while awaiting results.  Patient's chemistry has resulted showing a slightly elevated potassium. Patient takes potassium supplementations daily. I discussed with patient discontinuing her potassium supplements for the next several days and then restarting having her level checked next week  by her primary care doctor. Currently awaiting LFTs and lipase results. Urinalysis pending. Ultrasound pending.  Lipase is slightly elevated. Urinalysis shows possible urinary tract infection. Ultrasound consistent with fatty liver disease. Potassium is slightly elevated at discussed with the patient discontinuing her potassium supplements for the next 3 days until she went see by her primary care doctor. We will discharge with a short course of Keflex for likely mild urinary tract infection, along with a short course of pain medication. Patient will follow-up with her primary care doctor this week. ____________________________________________   FINAL CLINICAL IMPRESSION(S) / ED DIAGNOSES  Right upper quadrant abdominal pain UTI   Harvest Dark, MD 05/07/16 1857

## 2016-05-07 NOTE — ED Notes (Signed)
Patient transported to Ultrasound 

## 2016-05-09 LAB — URINE CULTURE

## 2016-05-14 ENCOUNTER — Other Ambulatory Visit: Payer: Self-pay

## 2016-05-21 NOTE — Progress Notes (Signed)
NOTE: This forensic urine drug screen (UDS) test was conducted using a state-of-the-art ultra high performance liquid chromatography and mass spectrometry system (UPLC/MS-MS), the most sophisticated and accurate method available. UPLC/MS-MS is 1,000 times more precise and accurate than standard gas chromatography and mass spectrometry (GC/MS). This system can analyze 26 drug categories and 180 drug compounds.  Unreported substance: Unreported hydrocodone  The findings of this UDT were reported as abnormal due to inconsistencies with expected results. An unreported substance was identified in the sample. Expectations were based on the medication history provided by the patient at the time of sample collection.  These results may suggest one of the following possibilities:  1). The use of multiple providers, suggesting the illegal practice of "Doctor Shopping", in violation of [place] Statutes, as well as our medication agreement.  2). The use of unsanctioned and possibly illegal substances, in violation of [place] Statutes, as well as our medication agreement. 3). Inaccurate list of reported substances.

## 2016-05-22 ENCOUNTER — Ambulatory Visit: Payer: Self-pay | Admitting: Gastroenterology

## 2016-05-24 ENCOUNTER — Ambulatory Visit: Payer: Medicaid Other | Attending: Physician Assistant

## 2016-06-04 NOTE — Progress Notes (Signed)
Patient's Name: Sara Munoz  MRN: [id number]  Referring Provider: [facility]  DOB: 1966/02/02  PCP: [place]  DOS: 06/05/2016  Note by: Kathlen Brunswick. Dossie Arbour, MD  Service setting: Ambulatory outpatient  Specialty: Interventional Pain Management  Location: [facility] (AMB) Pain Management Facility    Patient type: Established   Primary Reason(s) for Visit: Encounter for post-procedure evaluation of chronic illness with mild to moderate exacerbation CC: Back Pain (lower bilateral)  HPI  [name] is a 50 y.o. year old, female patient, who comes today for a post-procedure evaluation. She has Non-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/2014; Hyperlipidemia; Costochondritis; COPD (chronic obstructive pulmonary disease) ([place]); GERD (gastroesophageal reflux disease); Hypertension; Anemia of chronic disease; Carotid artery stenosis; Chronic constipation; Chronic cough; Chronic nausea; Cigarette nicotine dependence with nicotine-induced disorder; Coronary artery disease; Drug abuse, IV; GAD (generalized anxiety disorder); Hypercholesterolemia; Hypomagnesemia; Narcotic abuse, continuous; Narcotic dependence ([place]); Osteoarthritis; Polysubstance abuse; PTSD (post-traumatic stress disorder); PVD (peripheral vascular disease) ([place]); Sleep apnea, obstructive; Smoker; TIA (transient ischemic attack); Long term current use of opiate analgesic; Long term prescription opiate use; Opiate use; Encounter for therapeutic drug level monitoring; Encounter for pain management consult; Chronic hip pain (Location of Tertiary source of pain) (Bilateral) (L>R); Chronic knee pain (Bilateral) (L>R); Chronic shoulder pain (Bilateral) (L>R); Chronic sacroiliac joint pain (Bilateral) (L>R); Chronic low back pain (Location of Primary Source of Pain) (Bilateral) (L>R); Chronic lower extremity pain (Location of Secondary source of pain) (Bilateral) (L>R); Osteoarthritis of hip (Bilateral) (L>R); Chronic neck pain (posterior  midline) (Bilateral) (L>R); Cervicogenic headache (Bilateral) (L>R); Occipital headache (Bilateral) (L>R); Chronic upper extremity pain (Bilateral) (L>R); Chronic Cervical radicular pain (Bilateral) (L>R); Chronic lumbar radicular pain (Right) (L5 dermatome); Lumbar facet syndrome (Bilateral) (L>R); Long term prescription benzodiazepine use; and Chronic pain syndrome on her problem list. Her primarily concern today is the Back Pain (lower bilateral)  Pain Assessment: Self-Reported Pain Score: 5 /10             Reported level is compatible with observation.       Pain Type: Chronic pain Pain Location: Back Pain Orientation: Lower Pain Descriptors / Indicators: Aching, Constant, Stabbing, Radiating Pain Frequency: Constant  [name] comes in today for post-procedure evaluation after the treatment done on 04/29/2016.  Further details on both, my assessment(s), as well as the proposed treatment plan, please see below.  Post-Procedure Assessment  04/29/2016 Procedure: Diagnostic Bilateral Lumbar Facet Block under fluoroscopy and IV sedation. Post-procedure pain score: 4/10 When the patient came into the clinics for the diagnostic bilateral lumbar facet block she had indicated that her pain was a 5/10. After the block she indicated that the pain had come down only to a 4/10. This represents less than 20% improvement. Influential Factors: BMI: 31.76 kg/m Intra-procedural challenges: None observed Assessment challenges: None detected         Post-procedural side-effects, adverse reactions, or complications: None reported Reported issues: None  Sedation: Sedation provided. When no sedatives are used, the analgesic levels obtained are directly associated to the effectiveness of the local anesthetics. However, when sedation is provided, the level of analgesia obtained during the initial 1 hour following the intervention, is believed to be the result of a combination of factors. These factors may  include, but are not limited to: 1. The effectiveness of the local anesthetics used. 2. The effects of the analgesic(s) and/or anxiolytic(s) used. 3. The degree of discomfort experienced by the patient at the time of the procedure. 4. The patients  ability and reliability in recalling and recording the events. 5. The presence and influence of possible secondary gains and/or psychosocial factors. Reported result: Relief experienced during the 1st hour after the procedure: 40 % (Ultra-Short Term Relief) Interpretative annotation: Analgesia during this period is likely to be Local Anesthetic and/or IV Sedative (Analgesic/Anxiolitic) related.          Effects of local anesthetic: The analgesic effects attained during this period are directly associated to the localized infiltration of local anesthetics and therefore cary significant diagnostic value as to the etiological location, or anatomical origin, of the pain. Expected duration of relief is directly dependent on the pharmacodynamics of the local anesthetic used. Long-acting (4-6 hours) anesthetics used.  Reported result: Relief during the next 4 to 6 hour after the procedure: 0 % (Short-Term Relief) Interpretative annotation: Complete relief would suggest area to be the source of the pain.          Long-term benefit: Defined as the period of time past the expected duration of local anesthetics. With the possible exception of prolonged sympathetic blockade from the local anesthetics, benefits during this period are typically attributed to, or associated with, other factors such as analgesic sensory neuropraxia, antiinflammatory effects, or beneficial biochemical changes provided by agents other than the local anesthetics Reported result: Extended relief following procedure: 0 % (patient denies pain relief from procedure.  Went to ED for lower back pain the next day.  after assessment was given pain medication .) (Long-Term Relief) Interpretative  annotation: Good relief. This could suggest inflammation to be a significant component in the etiology to the pain.          Current benefits: Defined as persistent relief that continues at this point in time.   Reported results: Treated area: 0 %       Interpretative annotation: No benefit whatsoever. This would argue against repeating therapy  Interpretation: Results would suggest non-involvement of the tested area.          Laboratory Chemistry  Inflammation Markers Lab Results  Component Value Date   ESRSEDRATE 7 03/01/2016   CRP 1.7 (H) 03/01/2016   Renal Function Lab Results  Component Value Date   BUN 30 (H) 05/07/2016   CREATININE 1.08 (H) 05/07/2016   GFRAA >60 05/07/2016   GFRNONAA 60 (L) 05/07/2016   Hepatic Function Lab Results  Component Value Date   AST 52 (H) 05/07/2016   ALT 57 (H) 05/07/2016   ALBUMIN 4.5 05/07/2016   Electrolytes Lab Results  Component Value Date   NA 130 (L) 05/07/2016   K 5.2 (H) 05/07/2016   CL 101 05/07/2016   CALCIUM 9.9 05/07/2016   MG 1.6 (L) 03/01/2016   Pain Modulating Vitamins Lab Results  Component Value Date   25OHVITD1 32 03/01/2016   25OHVITD2 <1.0 03/01/2016   25OHVITD3 32 03/01/2016   VITAMINB12 491 03/01/2016   Coagulation Parameters Lab Results  Component Value Date   PLT 290 05/07/2016   Cardiovascular Lab Results  Component Value Date   BNP 24.0 08/01/2015   HGB 15.8 05/07/2016   HCT 47.8 (H) 05/07/2016   Note: Lab results reviewed.  Recent Diagnostic Imaging Review  Dg Chest 2 View  Result Date: 05/07/2016 CLINICAL DATA:  Right upper quadrant pain, left chest pain EXAM: CHEST  2 VIEW COMPARISON:  None. FINDINGS: The heart size and mediastinal contours are within normal limits. Both lungs are clear. The visualized skeletal structures are unremarkable. IMPRESSION: No active cardiopulmonary disease. Electronically Signed  By: Kathreen Devoid   On: 05/07/2016 16:19   US Abdomen Limited Ruq  Result  Date: 05/07/2016 CLINICAL DATA:  Right upper quadrant pain x 4 days EXAM: US ABDOMEN LIMITED - RIGHT UPPER QUADRANT COMPARISON:  None. FINDINGS: Gallbladder: No gallstones or wall thickening visualized. No sonographic Murphy sign noted by sonographer. Common bile duct: Diameter: 3.4 mm without choledocholithiasis. Liver: No focal lesion identified.  Diffusely echogenic liver parenchyma. IMPRESSION: Echogenic liver parenchyma consistent with hepatic steatosis. Otherwise negative exam. Electronically Signed   By: Ashley Royalty M.D.   On: 05/07/2016 17:31   Note: Imaging results reviewed.          Meds  The patient has a current medication list which includes the following prescription(s): albuterol, aspirin ec, atorvastatin, budesonide-formoterol, buspirone, dexlansoprazole, docusate sodium, gabapentin, linaclotide, lisinopril, magnesium oxide, metoprolol tartrate, nitroglycerin, potassium chloride, ranexa, tizanidine, and meloxicam, and the following Facility-Administered Medications: orphenadrine.  Current Outpatient Prescriptions on File Prior to Visit  Medication Sig  . albuterol (PROVENTIL HFA;VENTOLIN HFA) 108 (90 Base) MCG/ACT inhaler Inhale 2 puffs into the lungs every 4 (four) hours as needed for wheezing or shortness of breath.  Marland Kitchen aspirin EC 81 MG tablet Take 81 mg by mouth daily.  Marland Kitchen atorvastatin (LIPITOR) 20 MG tablet Take 20 mg by mouth at bedtime.  . budesonide-formoterol (SYMBICORT) 160-4.5 MCG/ACT inhaler Inhale 2 puffs into the lungs 2 (two) times daily.  . busPIRone (BUSPAR) 7.5 MG tablet Take 7.5 mg by mouth 3 (three) times daily.   Marland Kitchen dexlansoprazole (DEXILANT) 60 MG capsule Take 60 mg by mouth daily.  Marland Kitchen docusate sodium (COLACE) 100 MG capsule Take 200 mg by mouth 2 (two) times daily as needed for mild constipation.  . gabapentin (NEURONTIN) 300 MG capsule Take 2 capsules (600 mg total) by mouth 3 (three) times daily.  Marland Kitchen linaclotide (LINZESS) 290 MCG CAPS capsule Take 290 mcg by mouth  daily before breakfast.  . lisinopril (PRINIVIL,ZESTRIL) 20 MG tablet Take 40 mg by mouth daily.   . Magnesium Oxide 500 MG CAPS Take 1 capsule (500 mg total) by mouth 2 (two) times daily at 8 am and 10 pm.  . metoprolol tartrate (LOPRESSOR) 25 MG tablet Take 1 tablet (25 mg total) by mouth 2 (two) times daily.  . nitroGLYCERIN (NITROSTAT) 0.4 MG SL tablet Place 1 tablet (0.4 mg total) under the tongue every 5 (five) minutes x 3 doses as needed for chest pain.  . potassium chloride (KLOR-CON) 20 MEQ packet Take 20 mEq by mouth 2 (two) times daily.   Marland Kitchen RANEXA 500 MG 12 hr tablet Take 1 tablet (500 mg total) by mouth 2 (two) times daily.  Marland Kitchen tiZANidine (ZANAFLEX) 4 MG tablet Take 2 tablets (8 mg total) by mouth 3 (three) times daily. (Patient taking differently: Take 4 mg by mouth 3 (three) times daily. )   No current facility-administered medications on file prior to visit.    ROS  Constitutional: Denies any fever or chills Gastrointestinal: No reported hemesis, hematochezia, vomiting, or acute GI distress Musculoskeletal: Denies any acute onset joint swelling, redness, loss of ROM, or weakness Neurological: No reported episodes of acute onset apraxia, aphasia, dysarthria, agnosia, amnesia, paralysis, loss of coordination, or loss of consciousness  Allergies  Ms. Doetsch is allergic to levofloxacin; flexeril [cyclobenzaprine]; keflex [cephalexin]; ketorolac; toradol [ketorolac tromethamine]; tramadol; and zoloft [sertraline hcl].  [place]  Drug: Ms. Fournet  reports that she uses drugs, including Marijuana. Alcohol:  reports that she drinks alcohol. Tobacco:  reports that  she has been smoking Cigarettes.  She has been smoking about 0.50 packs per day. She has never used smokeless tobacco. Medical:  has a past medical history of Anxiety; Atypical chest pain (08/02/2015); CAD (coronary artery disease); Chronic back pain; COPD (chronic obstructive pulmonary disease) ([employer]); Coronary artery disease; GERD  (gastroesophageal reflux disease); Hypercholesteremia; Hypertension; Hypokalemia; Hypomagnesemia (01/04/2014); Liver disease; MI (myocardial infarction); and Osteoarthritis. Family: family history includes Heart disease in her mother; Lung cancer in her mother; Ovarian cancer in her mother.  Past Surgical History:  Procedure Laterality Date  . CARDIAC CATHETERIZATION Left 02/22/2016   Procedure: Left Heart Cath and Coronary Angiography;  Surgeon: Yolonda Kida, MD;  Location: [place] [facility];  Service: Cardiovascular;  Laterality: Left;  . hemorroids    . ORIF FEMUR FRACTURE    . ORIF TIBIA & FIBULA FRACTURES    . OVARY SURGERY     Constitutional Exam  General appearance: Well nourished, well developed, and well hydrated. In no apparent acute distress Vitals:   06/05/16 1248  BP: 138/88  Pulse: 72  Resp: 16  Temp: 98.6 F (37 C)  TempSrc: Oral  SpO2: 100%  Weight: 185 lb (83.9 kg)  Height: 5\' 4"  (1.626 m)   BMI Assessment: Estimated body mass index is 31.76 kg/m as calculated from the following:   Height as of this encounter: 5\' 4"  (1.626 m).   Weight as of this encounter: 185 lb (83.9 kg).  BMI interpretation table: BMI level Category Range association with higher incidence of chronic pain  <18 kg/m2 Underweight   18.5-24.9 kg/m2 Ideal body weight   25-29.9 kg/m2 Overweight Increased incidence by 20%  30-34.9 kg/m2 Obese (Class I) Increased incidence by 68%  35-39.9 kg/m2 Severe obesity (Class II) Increased incidence by 136%  >40 kg/m2 Extreme obesity (Class III) Increased incidence by 254%   BMI Readings from Last 4 Encounters:  06/05/16 31.76 kg/m  05/07/16 32.61 kg/m  04/30/16 33.30 kg/m  04/29/16 33.64 kg/m   Wt Readings from Last 4 Encounters:  06/05/16 185 lb (83.9 kg)  05/07/16 190 lb (86.2 kg)  04/30/16 194 lb (88 kg)  04/29/16 196 lb (88.9 kg)  Psych/Mental status: Alert, oriented x 3 (person, place, & time) Eyes: PERLA Respiratory: No  evidence of acute respiratory distress  Cervical Spine Exam  Inspection: No masses, redness, or swelling Alignment: Symmetrical Functional ROM: Unrestricted ROM Stability: No instability detected Muscle strength & Tone: Functionally intact Sensory: Unimpaired Palpation: Non-contributory  Upper Extremity (UE) Exam    Side: Right upper extremity  Side: Left upper extremity  Inspection: No masses, redness, swelling, or asymmetry  Inspection: No masses, redness, swelling, or asymmetry  Functional ROM: Unrestricted ROM          Functional ROM: Unrestricted ROM          Muscle strength & Tone: Functionally intact  Muscle strength & Tone: Functionally intact  Sensory: Unimpaired  Sensory: Unimpaired  Palpation: Non-contributory  Palpation: Non-contributory   Thoracic Spine Exam  Inspection: No masses, redness, or swelling Alignment: Symmetrical Functional ROM: Unrestricted ROM Stability: No instability detected Sensory: Unimpaired Muscle strength & Tone: Functionally intact Palpation: Non-contributory  Lumbar Spine Exam  Inspection: No masses, redness, or swelling Alignment: Symmetrical Functional ROM: Unrestricted ROM Stability: No instability detected Muscle strength & Tone: Functionally intact Sensory: Unimpaired Palpation: Non-contributory Provocative Tests: Lumbar Hyperextension and rotation test: evaluation deferred today       Patrick's Maneuver: Positive for bilateral S-I joint pain and for bilateral hip  joint pain.  Gait & Posture Assessment  Ambulation: Unassisted Gait: Relatively normal for age and body habitus Posture: WNL   Lower Extremity Exam    Side: Right lower extremity  Side: Left lower extremity  Inspection: No masses, redness, swelling, or asymmetry  Inspection: No masses, redness, swelling, or asymmetry  Functional ROM: Unrestricted ROM          Functional ROM: Unrestricted ROM          Muscle strength & Tone: Functionally intact  Muscle strength &  Tone: Functionally intact  Sensory: Unimpaired  Sensory: Unimpaired  Palpation: Non-contributory  Palpation: Non-contributory   Assessment  Primary Diagnosis & Pertinent Problem List: The primary encounter diagnosis was Chronic sacroiliac joint pain (Bilateral) (L>R). Diagnoses of Chronic pain syndrome, Chronic low back pain (Location of Primary Source of Pain) (Bilateral) (L>R), Chronic lower extremity pain (Location of Secondary source of pain) (Bilateral) (L>R), Chronic hip pain, unspecified laterality, Lumbar facet syndrome (Bilateral) (L>R), Long term current use of opiate analgesic, Opiate use, and Primary osteoarthritis involving multiple joints were also pertinent to this visit.  Status Diagnosis   Unimproved  Worsened  Unimproved 1. Chronic sacroiliac joint pain (Bilateral) (L>R)   2. Chronic pain syndrome   3. Chronic low back pain (Location of Primary Source of Pain) (Bilateral) (L>R)   4. Chronic lower extremity pain (Location of Secondary source of pain) (Bilateral) (L>R)   5. Chronic hip pain, unspecified laterality   6. Lumbar facet syndrome (Bilateral) (L>R)   7. Long term current use of opiate analgesic   8. Opiate use   9. Primary osteoarthritis involving multiple joints      Plan of Care  Pharmacotherapy (Medications Ordered): Meds ordered this encounter  Medications  . meloxicam (MOBIC) 15 MG tablet    Sig: Take 1 tablet (15 mg total) by mouth daily.    Dispense:  30 tablet    Refill:  2    Do not add this medication to the electronic "Automatic Refill" notification system. Patient may have prescription filled one day early if pharmacy is closed on scheduled refill date.  . orphenadrine (NORFLEX) injection 60 mg   New Prescriptions   MELOXICAM (MOBIC) 15 MG TABLET    Take 1 tablet (15 mg total) by mouth daily.   Medications administered today: Ms. Watton had no medications administered during this visit. Lab-work, procedure(s), and/or referral(s): Orders  Placed This Encounter  Procedures  . SACROILIAC JOINT INJECTINS  . ToxASSURE Select 13 (MW), Urine   Imaging and/or referral(s): None  Interventional therapies: Planned, scheduled, and/or pending:   Diagnostic bilateral sacroiliac joint block under fluoroscopic guidance, with or without sedation.    Considering:   Diagnostic bilateral sacroiliac joint block under fluoroscopic guidance, with or without sedation.  Possible bilateral sacroiliac joint radiofrequency ablation under fluoroscopic guidance and IV sedation.  Diagnostic right-sided L4-5 interlaminar lumbar epidural steroid injection under fluoroscopic guidance, with or without sedation.  Diagnostic right-sided L5-S1 transforaminal epidural steroid injection under fluoroscopic guidance, with or without sedation.  Diagnostic bilateral intra-articular hip joint injection under fluoroscopic guidance, with or without sedation.  Diagnostic bilateral up to date her and femoral nerve articular branch blocks under fluoroscopic guidance, with or without sedation.  Possible bilateral hip joint radiofrequency ablation under fluoroscopic guidance and IV sedation.  Diagnostic bilateral cervical facet block under fluoroscopic guidance and IV sedation.  Possible bilateral cervical facet radiofrequency ablation under fluoroscopic guidance and IV sedation.  Diagnostic left-sided cervical epidural steroid injection under fluoroscopic guidance, with  or without sedation.  Diagnostic bilateral intra-articular shoulder joint injection under fluoroscopic guidance, with or without sedation.  Diagnostic bilateral suprascapular nerve block under fluoroscopic guidance and IV sedation.  Possible bilateral suprascapular nerve radiofrequency ablation under fluoroscopic guidance and IV sedation.  Diagnostic bilateral intra-articular knee joint injection with local anesthetic and steroids without fluoroscopy or IV sedation.  Possible series of 5 intra-articular  Hyalgan knee injections without fluoroscopy or IV sedation.  Diagnostic bilateral genicular nerve blocks under fluoroscopic guidance and IV sedation.  Possible bilateral genicular nerve radiofrequency ablation under fluoroscopic guidance and IV sedation.  Diagnostic bilateral greater occipital nerve blocks under fluoroscopic guidance, with or without sedation.  Possible bilateral greater occipital nerve radiofrequency ablation under fluoroscopic guidance and IV sedation.    Palliative PRN treatment(s):   Not at this time.   Provider-requested follow-up: Return for procedure (ASAA).  Future Appointments Date Time Provider [place]  06/10/2016 1:30 PM [name], MD [facility] None  06/26/2016 10:15 AM Milinda Pointer, MD [facility] None   Primary Care Physician: [facility] Location: [facility] Outpatient Pain Management Facility Note by: Kathlen Brunswick. Dossie Arbour, M.D, DABA, DABAPM, DABPM, DABIPP, FIPP Date: 06/05/16; Time: 3:56 PM  Pain Score Disclaimer: We use the NRS-11 scale. This is a self-reported, subjective measurement of pain severity with only modest accuracy. It is used primarily to identify changes within a particular patient. It must be understood that outpatient pain scales are significantly less accurate that those used for research, where they can be applied under ideal controlled circumstances with minimal exposure to variables. In reality, the score is likely to be a combination of pain intensity and pain affect, where pain affect describes the degree of emotional arousal or changes in action readiness caused by the sensory experience of pain. Factors such as social and work situation, setting, emotional state, anxiety levels, expectation, and prior pain experience may influence pain perception and show large inter-individual differences that may also be affected by time variables.  Patient instructions provided during this appointment: Patient Instructions   Sacroiliac (SI) Joint Injection Patient Information  Description: The sacroiliac joint connects the scrum (very low back and tailbone) to the ilium (a pelvic bone which also forms half of the hip joint).  Normally this joint experiences very little motion.  When this joint becomes inflamed or unstable low back and or hip and pelvis pain may result.  Injection of this joint with local anesthetics (numbing medicines) and steroids can provide diagnostic information and reduce pain.  This injection is performed with the aid of x-ray guidance into the tailbone area while you are lying on your stomach.   You may experience an electrical sensation down the leg while this is being done.  You may also experience numbness.  We also may ask if we are reproducing your normal pain during the injection.  Conditions which may be treated SI injection:   Low back, buttock, hip or leg pain  Preparation for the Injection:  1. Do not eat any solid food or dairy products within 8 hours of your appointment.  2. You may drink clear liquids up to 3 hours before appointment.  Clear liquids include water, black coffee, juice or soda.  No milk or cream please. 3. You may take your regular medications, including pain medications with a sip of water before your appointment.  Diabetics should hold regular insulin (if take separately) and take 1/2 normal NPH dose the morning of the procedure.  Carry some sugar containing items with  you to your appointment. 4. A driver must accompany you and be prepared to drive you home after your procedure. 5. Bring all of your current medications with you. 6. An IV may be inserted and sedation may be given at the discretion of the physician. 7. A blood pressure cuff, EKG and other monitors will often be applied during the procedure.  Some patients may need to have extra oxygen administered for a short period.  8. You will be asked to provide medical information, including your allergies,  prior to the procedure.  We must know immediately if you are taking blood thinners (like Coumadin/Warfarin) or if you are allergic to IV iodine contrast (dye).  We must know if you could possible be pregnant.  Possible side effects:   Bleeding from needle site  Infection (rare, may require surgery)  Nerve injury (rare)  Numbness & tingling (temporary)  A brief convulsion or seizure  Light-headedness (temporary)  Pain at injection site (several days)  Decreased blood pressure (temporary)  Weakness in the leg (temporary)   Call if you experience:   New onset weakness or numbness of an extremity below the injection site that last more than 8 hours.  Hives or difficulty breathing ( go to the emergency room)  Inflammation or drainage at the injection site  Any new symptoms which are concerning to you  Please note:  Although the local anesthetic injected can often make your back/ hip/ buttock/ leg feel good for several hours after the injections, the pain will likely return.  It takes 3-7 days for steroids to work in the sacroiliac area.  You may not notice any pain relief for at least that one week.  If effective, we will often do a series of three injections spaced 3-6 weeks apart to maximally decrease your pain.  After the initial series, we generally will wait some months before a repeat injection of the same type.  If you have any questions, please call [phone] [place] have a script for Mobic that was sent in to the pharmacy.Pain Management Discharge Instructions  General Discharge Instructions :  If you need to reach your doctor call: Monday-Friday 8:00 am - 4:00 pm at [phone] or toll free [phone].  After clinic hours [phone] to have operator reach doctor.  Bring all of your medication bottles to all your appointments in the pain clinic.  To cancel or reschedule your appointment with Pain Management  please remember to call 24 hours in advance to avoid a fee.  Refer to the educational materials which you have been given on: General Risks, I had my Procedure. Discharge Instructions, Post Sedation.  Post Procedure Instructions:  The drugs you were given will stay in your system until tomorrow, so for the next 24 hours you should not drive, make any legal decisions or drink any alcoholic beverages.  You may eat anything you prefer, but it is better to start with liquids then soups and crackers, and gradually work up to solid foods.  Please notify your doctor immediately if you have any unusual bleeding, trouble breathing or pain that is not related to your normal pain.  Depending on the type of procedure that was done, some parts of your body may feel week and/or numb.  This usually clears up by tonight or the next day.  Walk with the use of an assistive device or accompanied by an adult for the 24 hours.  You may use ice on  the affected area for the first 24 hours.  Put ice in a Ziploc bag and cover with a towel and place against area 15 minutes on 15 minutes off.  You may switch to heat after 24 hours.

## 2016-06-05 ENCOUNTER — Ambulatory Visit: Payer: Medicaid Other | Attending: Pain Medicine | Admitting: Pain Medicine

## 2016-06-05 ENCOUNTER — Encounter: Payer: Self-pay | Admitting: Pain Medicine

## 2016-06-05 VITALS — BP 138/88 | HR 72 | Temp 98.6°F | Resp 16 | Ht 64.0 in | Wt 185.0 lb

## 2016-06-05 DIAGNOSIS — M1610 Unilateral primary osteoarthritis, unspecified hip: Secondary | ICD-10-CM | POA: Insufficient documentation

## 2016-06-05 DIAGNOSIS — M79605 Pain in left leg: Secondary | ICD-10-CM

## 2016-06-05 DIAGNOSIS — G894 Chronic pain syndrome: Secondary | ICD-10-CM | POA: Diagnosis not present

## 2016-06-05 DIAGNOSIS — K5909 Other constipation: Secondary | ICD-10-CM | POA: Diagnosis not present

## 2016-06-05 DIAGNOSIS — Z7982 Long term (current) use of aspirin: Secondary | ICD-10-CM | POA: Diagnosis not present

## 2016-06-05 DIAGNOSIS — Z801 Family history of malignant neoplasm of trachea, bronchus and lung: Secondary | ICD-10-CM | POA: Diagnosis not present

## 2016-06-05 DIAGNOSIS — M150 Primary generalized (osteo)arthritis: Secondary | ICD-10-CM

## 2016-06-05 DIAGNOSIS — F1190 Opioid use, unspecified, uncomplicated: Secondary | ICD-10-CM

## 2016-06-05 DIAGNOSIS — Z79891 Long term (current) use of opiate analgesic: Secondary | ICD-10-CM | POA: Insufficient documentation

## 2016-06-05 DIAGNOSIS — M1288 Other specific arthropathies, not elsewhere classified, other specified site: Secondary | ICD-10-CM | POA: Diagnosis not present

## 2016-06-05 DIAGNOSIS — M545 Low back pain, unspecified: Secondary | ICD-10-CM

## 2016-06-05 DIAGNOSIS — J449 Chronic obstructive pulmonary disease, unspecified: Secondary | ICD-10-CM | POA: Diagnosis not present

## 2016-06-05 DIAGNOSIS — M5412 Radiculopathy, cervical region: Secondary | ICD-10-CM | POA: Insufficient documentation

## 2016-06-05 DIAGNOSIS — M25559 Pain in unspecified hip: Secondary | ICD-10-CM

## 2016-06-05 DIAGNOSIS — I739 Peripheral vascular disease, unspecified: Secondary | ICD-10-CM | POA: Insufficient documentation

## 2016-06-05 DIAGNOSIS — I6529 Occlusion and stenosis of unspecified carotid artery: Secondary | ICD-10-CM | POA: Diagnosis not present

## 2016-06-05 DIAGNOSIS — M79604 Pain in right leg: Secondary | ICD-10-CM | POA: Diagnosis not present

## 2016-06-05 DIAGNOSIS — Z808 Family history of malignant neoplasm of other organs or systems: Secondary | ICD-10-CM | POA: Diagnosis not present

## 2016-06-05 DIAGNOSIS — M8949 Other hypertrophic osteoarthropathy, multiple sites: Secondary | ICD-10-CM

## 2016-06-05 DIAGNOSIS — M533 Sacrococcygeal disorders, not elsewhere classified: Secondary | ICD-10-CM | POA: Insufficient documentation

## 2016-06-05 DIAGNOSIS — F17210 Nicotine dependence, cigarettes, uncomplicated: Secondary | ICD-10-CM | POA: Diagnosis not present

## 2016-06-05 DIAGNOSIS — M47816 Spondylosis without myelopathy or radiculopathy, lumbar region: Secondary | ICD-10-CM

## 2016-06-05 DIAGNOSIS — K219 Gastro-esophageal reflux disease without esophagitis: Secondary | ICD-10-CM | POA: Diagnosis not present

## 2016-06-05 DIAGNOSIS — E7800 Pure hypercholesterolemia, unspecified: Secondary | ICD-10-CM | POA: Diagnosis not present

## 2016-06-05 DIAGNOSIS — M159 Polyosteoarthritis, unspecified: Secondary | ICD-10-CM

## 2016-06-05 DIAGNOSIS — E876 Hypokalemia: Secondary | ICD-10-CM | POA: Insufficient documentation

## 2016-06-05 DIAGNOSIS — I219 Acute myocardial infarction, unspecified: Secondary | ICD-10-CM | POA: Diagnosis not present

## 2016-06-05 DIAGNOSIS — D638 Anemia in other chronic diseases classified elsewhere: Secondary | ICD-10-CM | POA: Diagnosis not present

## 2016-06-05 DIAGNOSIS — I2510 Atherosclerotic heart disease of native coronary artery without angina pectoris: Secondary | ICD-10-CM | POA: Insufficient documentation

## 2016-06-05 DIAGNOSIS — F411 Generalized anxiety disorder: Secondary | ICD-10-CM | POA: Insufficient documentation

## 2016-06-05 DIAGNOSIS — I252 Old myocardial infarction: Secondary | ICD-10-CM | POA: Insufficient documentation

## 2016-06-05 DIAGNOSIS — M25511 Pain in right shoulder: Secondary | ICD-10-CM | POA: Insufficient documentation

## 2016-06-05 DIAGNOSIS — M25512 Pain in left shoulder: Secondary | ICD-10-CM | POA: Insufficient documentation

## 2016-06-05 DIAGNOSIS — M940 Chondrocostal junction syndrome [Tietze]: Secondary | ICD-10-CM | POA: Insufficient documentation

## 2016-06-05 DIAGNOSIS — G8929 Other chronic pain: Secondary | ICD-10-CM

## 2016-06-05 MED ORDER — ORPHENADRINE CITRATE 30 MG/ML IJ SOLN
60.0000 mg | Freq: Once | INTRAMUSCULAR | Status: DC
  Filled 2016-06-05: qty 2

## 2016-06-05 MED ORDER — MELOXICAM 15 MG PO TABS: 15.0000 mg | ORAL_TABLET | Freq: Every day | ORAL | 2 refills | Status: DC

## 2016-06-05 NOTE — Patient Instructions (Addendum)
Sacroiliac (SI) Joint Injection Patient Information  Description: The sacroiliac joint connects the scrum (very low back and tailbone) to the ilium (a pelvic bone which also forms half of the hip joint).  Normally this joint experiences very little motion.  When this joint becomes inflamed or unstable low back and or hip and pelvis pain may result.  Injection of this joint with local anesthetics (numbing medicines) and steroids can provide diagnostic information and reduce pain.  This injection is performed with the aid of x-ray guidance into the tailbone area while you are lying on your stomach.   You may experience an electrical sensation down the leg while this is being done.  You may also experience numbness.  We also may ask if we are reproducing your normal pain during the injection.  Conditions which may be treated SI injection:   Low back, buttock, hip or leg pain  Preparation for the Injection:  1. Do not eat any solid food or dairy products within 8 hours of your appointment.  2. You may drink clear liquids up to 3 hours before appointment.  Clear liquids include water, black coffee, juice or soda.  No milk or cream please. 3. You may take your regular medications, including pain medications with a sip of water before your appointment.  Diabetics should hold regular insulin (if take separately) and take 1/2 normal NPH dose the morning of the procedure.  Carry some sugar containing items with you to your appointment. 4. A driver must accompany you and be prepared to drive you home after your procedure. 5. Bring all of your current medications with you. 6. An IV may be inserted and sedation may be given at the discretion of the physician. 7. A blood pressure cuff, EKG and other monitors will often be applied during the procedure.  Some patients may need to have extra oxygen administered for a short period.  8. You will be asked to provide medical information, including your allergies,  prior to the procedure.  We must know immediately if you are taking blood thinners (like Coumadin/Warfarin) or if you are allergic to IV iodine contrast (dye).  We must know if you could possible be pregnant.  Possible side effects:   Bleeding from needle site  Infection (rare, may require surgery)  Nerve injury (rare)  Numbness & tingling (temporary)  A brief convulsion or seizure  Light-headedness (temporary)  Pain at injection site (several days)  Decreased blood pressure (temporary)  Weakness in the leg (temporary)   Call if you experience:   New onset weakness or numbness of an extremity below the injection site that last more than 8 hours.  Hives or difficulty breathing ( go to the emergency room)  Inflammation or drainage at the injection site  Any new symptoms which are concerning to you  Please note:  Although the local anesthetic injected can often make your back/ hip/ buttock/ leg feel good for several hours after the injections, the pain will likely return.  It takes 3-7 days for steroids to work in the sacroiliac area.  You may not notice any pain relief for at least that one week.  If effective, we will often do a series of three injections spaced 3-6 weeks apart to maximally decrease your pain.  After the initial series, we generally will wait some months before a repeat injection of the same type.  If you have any questions, please call [phone] [place] have a  script for Mobic that was sent in to the pharmacy.Pain Management Discharge Instructions  General Discharge Instructions :  If you need to reach your doctor call: Monday-Friday 8:00 am - 4:00 pm at [phone] or toll free [phone].  After clinic hours [phone] to have operator reach doctor.  Bring all of your medication bottles to all your appointments in the pain clinic.  To cancel or reschedule your appointment with Pain Management  please remember to call 24 hours in advance to avoid a fee.  Refer to the educational materials which you have been given on: General Risks, I had my Procedure. Discharge Instructions, Post Sedation.  Post Procedure Instructions:  The drugs you were given will stay in your system until tomorrow, so for the next 24 hours you should not drive, make any legal decisions or drink any alcoholic beverages.  You may eat anything you prefer, but it is better to start with liquids then soups and crackers, and gradually work up to solid foods.  Please notify your doctor immediately if you have any unusual bleeding, trouble breathing or pain that is not related to your normal pain.  Depending on the type of procedure that was done, some parts of your body may feel week and/or numb.  This usually clears up by tonight or the next day.  Walk with the use of an assistive device or accompanied by an adult for the 24 hours.  You may use ice on the affected area for the first 24 hours.  Put ice in a Ziploc bag and cover with a towel and place against area 15 minutes on 15 minutes off.  You may switch to heat after 24 hours.

## 2016-06-10 ENCOUNTER — Ambulatory Visit (INDEPENDENT_AMBULATORY_CARE_PROVIDER_SITE_OTHER): Payer: Medicaid Other | Admitting: Gastroenterology

## 2016-06-10 ENCOUNTER — Encounter: Payer: Self-pay | Admitting: Gastroenterology

## 2016-06-10 VITALS — BP 110/78 | HR 61 | Temp 97.7°F | Ht 64.0 in | Wt 197.0 lb

## 2016-06-10 DIAGNOSIS — F1010 Alcohol abuse, uncomplicated: Secondary | ICD-10-CM

## 2016-06-10 DIAGNOSIS — R945 Abnormal results of liver function studies: Secondary | ICD-10-CM

## 2016-06-10 DIAGNOSIS — R7989 Other specified abnormal findings of blood chemistry: Secondary | ICD-10-CM

## 2016-06-10 DIAGNOSIS — F17200 Nicotine dependence, unspecified, uncomplicated: Secondary | ICD-10-CM

## 2016-06-10 NOTE — Patient Instructions (Signed)
Please go to the medical mall to get you labs drawn. Please refer below for your follow up appointment.

## 2016-06-10 NOTE — Progress Notes (Signed)
Gastroenterology Consultation  Referring Provider:     [facility], [place] Physician:  [facility]ologist:  Dr. [name]  Reason for Consultation:     Hepatitis C        HPI:   Sara Munoz is a 50 y.o. y/o female referred for consultation & management  by Dr. [facility].    She says that she was diagnosed 2 months back for hepatitis C . She has not received any treatment previously. She said that her step brother had hepatitis C. She says she was a "big drinker" in the past and consumed a 12 pack twice a week for about "all my life".   Presently she consumes 1 case of alcohol a week . She denies any tatoos., no incarceration or Armed forces logistics/support/administrative officer. She used heroine last July , non since. Last used cocaine last year. No Armed forces logistics/support/administrative officer. Denies use of any herbal medications.   RUQ USG 05/07/16 showed hepatic steatosis.   Past Medical History:  Diagnosis Date  . Anxiety   . Atypical chest pain 08/02/2015  . CAD (coronary artery disease)   . Chronic back pain   . COPD (chronic obstructive pulmonary disease) ([place])   . Coronary artery disease    a. Mild-non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/2014  . GERD (gastroesophageal reflux disease)   . Hypercholesteremia   . Hypertension   . Hypokalemia   . Hypomagnesemia 01/04/2014  . Liver disease   . MI (myocardial infarction)   . Osteoarthritis     Past Surgical History:  Procedure Laterality Date  . CARDIAC CATHETERIZATION Left 02/22/2016   Procedure: Left Heart Cath and Coronary Angiography;  Surgeon: Yolonda Kida, MD;  Location: [facility];  Service: Cardiovascular;  Laterality: Left;  . hemorroids    . ORIF FEMUR FRACTURE    . ORIF TIBIA & FIBULA FRACTURES    . OVARY SURGERY      Prior to Admission medications   Medication Sig Start Date End Date Taking? Authorizing Provider  albuterol (PROVENTIL HFA;VENTOLIN HFA) 108 (90 Base) MCG/ACT inhaler  Inhale 2 puffs into the lungs every 4 (four) hours as needed for wheezing or shortness of breath. 08/03/15  Yes Demetrios Loll, MD  aspirin EC 81 MG tablet Take 81 mg by mouth daily.   Yes Historical Provider, MD  atorvastatin (LIPITOR) 20 MG tablet Take 20 mg by mouth at bedtime.   Yes Historical Provider, MD  budesonide-formoterol (SYMBICORT) 160-4.5 MCG/ACT inhaler Inhale 2 puffs into the lungs 2 (two) times daily. 05/31/15  Yes Gregor Hams, MD  busPIRone (BUSPAR) 7.5 MG tablet Take 7.5 mg by mouth 3 (three) times daily.    Yes Historical Provider, MD  dexlansoprazole (DEXILANT) 60 MG capsule Take 60 mg by mouth daily.   Yes Historical Provider, MD  docusate sodium (COLACE) 100 MG capsule Take 200 mg by mouth 2 (two) times daily as needed for mild constipation.   Yes Historical Provider, MD  gabapentin (NEURONTIN) 300 MG capsule Take 2 capsules (600 mg total) by mouth 3 (three) times daily. 08/03/15  Yes Demetrios Loll, MD  linaclotide [facility]) 290 MCG CAPS capsule Take 290 mcg by mouth daily before breakfast.   Yes Historical Provider, MD  lisinopril (PRINIVIL,ZESTRIL) 20 MG tablet Take 40 mg by mouth daily.    Yes Historical Provider, MD  Magnesium Oxide 500 MG CAPS Take 1 capsule (500 mg total) by mouth 2 (two) times daily  at 8 am and 10 pm. 04/24/16  Yes Milinda Pointer, MD  meloxicam (MOBIC) 15 MG tablet Take 1 tablet (15 mg total) by mouth daily. 06/05/16 09/03/16 Yes Milinda Pointer, MD  metoprolol tartrate (LOPRESSOR) 25 MG tablet Take 1 tablet (25 mg total) by mouth 2 (two) times daily. 08/03/15  Yes Demetrios Loll, MD  nitroGLYCERIN (NITROSTAT) 0.4 MG SL tablet Place 1 tablet (0.4 mg total) under the tongue every 5 (five) minutes x 3 doses as needed for chest pain. 08/03/15  Yes Demetrios Loll, MD  potassium chloride (KLOR-CON) 20 MEQ packet Take 20 mEq by mouth 2 (two) times daily.    Yes Historical Provider, MD  RANEXA 500 MG 12 hr tablet Take 1 tablet (500 mg total) by mouth 2 (two) times daily. 08/03/15   Yes Demetrios Loll, MD  tiZANidine (ZANAFLEX) 4 MG tablet Take 2 tablets (8 mg total) by mouth 3 (three) times daily. Patient taking differently: Take 4 mg by mouth 3 (three) times daily.  02/05/16  Yes Victorino Dike, FNP    Family History  Problem Relation Age of Onset  . Heart disease Mother   . Lung cancer Mother   . Ovarian cancer Mother      Social History  Substance Use Topics  . Smoking status: Current Every Day Smoker    Packs/day: 0.50    Types: Cigarettes  . Smokeless tobacco: Never Used  . Alcohol use 0.0 oz/week     Comment: drinks occasionally    Allergies as of 06/10/2016 - Review Complete 06/10/2016  Allergen Reaction Noted  . Levofloxacin Hives and Swelling 02/27/2012  . Flexeril [cyclobenzaprine] Hives, Swelling, and Other (See Comments) 11/13/2013  . Keflex [cephalexin] Hives 10/22/2010  . Ketorolac  10/22/2010  . Toradol [ketorolac tromethamine] Swelling and Other (See Comments) 01/17/2015  . Tramadol Hives, Swelling, and Other (See Comments) 01/17/2015  . Zoloft [sertraline hcl] Swelling and Other (See Comments) 05/31/2015    Review of Systems:    All systems reviewed and negative except where noted in HPI.   Physical Exam:  BP 110/78   Pulse 61   Temp 97.7 F (36.5 C) (Oral)   Ht 5\' 4"  (1.626 m)   Wt 197 lb (89.4 kg)   BMI 33.81 kg/m  No LMP recorded. Patient is not currently having periods (Reason: Perimenopausal). Psych:  Alert and cooperative. Normal mood and affect. General:   Alert,  Well-developed, well-nourished, pleasant and cooperative in NAD Head:  Normocephalic and atraumatic. Eyes:  Sclera clear, no icterus.   Conjunctiva pink. Ears:  Normal auditory acuity. Nose:  No deformity, discharge, or lesions. Mouth:  No deformity or lesions,oropharynx pink & moist. Neck:  Supple; no masses or thyromegaly. Lungs:  Respirations even and unlabored.  Clear throughout to auscultation.   No wheezes, crackles, or rhonchi. No acute distress. Heart:   Regular rate and rhythm; no murmurs, clicks, rubs, or gallops. Abdomen:  Normal bowel sounds.  No bruits.  Soft, non-tender and non-distended without masses, hepatosplenomegaly or hernias noted.  No guarding or rebound tenderness.    Msk:  Symmetrical without gross deformities. Good, equal movement & strength bilaterally. Pulses:  Normal pulses noted. Extremities:  No clubbing or edema.  No cyanosis. Neurologic:  Alert and oriented x3;  grossly normal neurologically. Skin:  Intact without significant lesions or rashes. No jaundice. Lymph Nodes:  No significant cervical adenopathy. Psych:  Alert and cooperative. Normal mood and affect.  Imaging Studies: No results found.  Assessment and Plan:  Sara Munoz is a 50 y.o. y/o female has been referred for hepatitis C. Transaminases are elevated. I suggested her to stop all alcohol consumption which has been in excess in the past . Stop all smoking . We will obtain viral and autoimmune hepatitis. Discuss treatment options at next visit. Will obtain labs to determine if she has any signs of cirrhosis biochemically   Follow up in 6 weeks   Dr [name] MD

## 2016-06-12 ENCOUNTER — Emergency Department
Admission: EM | Admit: 2016-06-12 | Discharge: 2016-06-13 | Disposition: A | Discharge status: Home / Self Care | Payer: Medicaid Other | Attending: Emergency Medicine | Admitting: Emergency Medicine

## 2016-06-12 ENCOUNTER — Encounter: Payer: Self-pay | Admitting: Emergency Medicine

## 2016-06-12 DIAGNOSIS — I252 Old myocardial infarction: Secondary | ICD-10-CM | POA: Diagnosis not present

## 2016-06-12 DIAGNOSIS — J449 Chronic obstructive pulmonary disease, unspecified: Secondary | ICD-10-CM | POA: Diagnosis not present

## 2016-06-12 DIAGNOSIS — I10 Essential (primary) hypertension: Secondary | ICD-10-CM | POA: Diagnosis not present

## 2016-06-12 DIAGNOSIS — Z79899 Other long term (current) drug therapy: Secondary | ICD-10-CM | POA: Insufficient documentation

## 2016-06-12 DIAGNOSIS — K859 Acute pancreatitis without necrosis or infection, unspecified: Secondary | ICD-10-CM | POA: Diagnosis not present

## 2016-06-12 DIAGNOSIS — N83201 Unspecified ovarian cyst, right side: Secondary | ICD-10-CM | POA: Insufficient documentation

## 2016-06-12 DIAGNOSIS — F17210 Nicotine dependence, cigarettes, uncomplicated: Secondary | ICD-10-CM | POA: Insufficient documentation

## 2016-06-12 DIAGNOSIS — I2510 Atherosclerotic heart disease of native coronary artery without angina pectoris: Secondary | ICD-10-CM | POA: Diagnosis not present

## 2016-06-12 DIAGNOSIS — R1084 Generalized abdominal pain: Secondary | ICD-10-CM | POA: Diagnosis present

## 2016-06-12 DIAGNOSIS — R112 Nausea with vomiting, unspecified: Secondary | ICD-10-CM

## 2016-06-12 DIAGNOSIS — Z7982 Long term (current) use of aspirin: Secondary | ICD-10-CM | POA: Insufficient documentation

## 2016-06-12 DIAGNOSIS — R197 Diarrhea, unspecified: Secondary | ICD-10-CM

## 2016-06-12 DIAGNOSIS — J45909 Unspecified asthma, uncomplicated: Secondary | ICD-10-CM | POA: Insufficient documentation

## 2016-06-12 HISTORY — DX: Unspecified asthma, uncomplicated: J45.909

## 2016-06-12 LAB — COMPREHENSIVE METABOLIC PANEL
ALT: 114 U/L — ABNORMAL HIGH (ref 14–54)
AST: 98 U/L — ABNORMAL HIGH (ref 15–41)
Albumin: 3.9 g/dL (ref 3.5–5.0)
Alkaline Phosphatase: 63 U/L (ref 38–126)
Anion gap: 8 (ref 5–15)
BUN: 8 mg/dL (ref 6–20)
CO2: 20 mmol/L — ABNORMAL LOW (ref 22–32)
Calcium: 8.8 mg/dL — ABNORMAL LOW (ref 8.9–10.3)
Chloride: 109 mmol/L (ref 101–111)
Creatinine, Ser: 0.79 mg/dL (ref 0.44–1.00)
GFR calc Af Amer: >60 mL/min (ref >60)
GFR calc non Af Amer: >60 mL/min (ref >60)
Glucose, Bld: 93 mg/dL (ref 65–99)
Potassium: 3.8 mmol/L (ref 3.5–5.1)
Sodium: 137 mmol/L (ref 135–145)
Total Bilirubin: <0.1 mg/dL — ABNORMAL LOW (ref 0.3–1.2)
Total Protein: 7.6 g/dL (ref 6.5–8.1)

## 2016-06-12 LAB — LIPASE, BLOOD: Lipase: 37 U/L (ref 11–51)

## 2016-06-12 LAB — CBC
HCT: 40.7 % (ref 35.0–47.0)
Hemoglobin: 13.8 g/dL (ref 12.0–16.0)
MCH: 30.9 pg (ref 26.0–34.0)
MCHC: 33.9 g/dL (ref 32.0–36.0)
MCV: 91.1 fL (ref 80.0–100.0)
Platelets: 242 10*3/uL (ref 150–440)
RBC: 4.47 MIL/uL (ref 3.80–5.20)
RDW: 15.3 % — ABNORMAL HIGH (ref 11.5–14.5)
WBC: 13 10*3/uL — ABNORMAL HIGH (ref 3.6–11.0)

## 2016-06-12 LAB — TOXASSURE SELECT 13 (MW), URINE

## 2016-06-12 MED ORDER — IOPAMIDOL (ISOVUE-300) INJECTION 61%
30.0000 mL | Freq: Once | INTRAVENOUS | Status: AC
  Administered 2016-06-12: 30 mL via ORAL

## 2016-06-12 MED ORDER — SODIUM CHLORIDE 0.9 % IV BOLUS (SEPSIS)
1000.0000 mL | Freq: Once | INTRAVENOUS | Status: AC
  Administered 2016-06-12: 1000 mL via INTRAVENOUS

## 2016-06-12 MED ORDER — HYDROMORPHONE HCL 1 MG/ML IJ SOLN
1.0000 mg | Freq: Once | INTRAMUSCULAR | Status: AC
  Administered 2016-06-12: 1 mg via INTRAVENOUS
  Filled 2016-06-12: qty 1

## 2016-06-12 MED ORDER — ONDANSETRON HCL 4 MG/2ML IJ SOLN
4.0000 mg | Freq: Once | INTRAMUSCULAR | Status: AC
  Administered 2016-06-12: 4 mg via INTRAVENOUS
  Filled 2016-06-12: qty 2

## 2016-06-12 NOTE — ED Notes (Signed)
Pt lying on stretcher, holding abdomen, moaning. Pt states abd pain x few days, worse today. States hx hep c and pancreatitis. C/o pain RUQ.

## 2016-06-12 NOTE — ED Triage Notes (Signed)
Pt arrived via ems from home with complaints of abdominal pain that has increased in severity since yesterday. Pain in upper right quadrant but pt is unable to describe sensation of pain. Pt has HX of hep C and follows up with a specialist.

## 2016-06-12 NOTE — ED Notes (Signed)
Last year drug abuse cocaine. Today drank 2 beers. No PCP given pain meds that she needs. Last Oxycodone coupe weeks. Dr. Owens Shark talking to patient in regards to plan of care. Pancreatis or something else because worse pain.

## 2016-06-12 NOTE — ED Provider Notes (Signed)
[facility] Emergency Department Provider Note    First MD Initiated Contact with Patient 06/12/16 2320     (approximate)  I have reviewed the triage vital signs and the nursing notes.   HISTORY  Chief Complaint Abdominal Pain   HPI Sara Munoz is a 50 y.o. female bolus of chronic medical conditions: Chronic abdominal pain presents to the emergency department with worsening generalized abdominal pain and vomiting since yesterday. Patient denies any fever no dysuria. Is any fever afebrile on presentation temperature 98.6   Past Medical History:  Diagnosis Date  . Anxiety   . Asthma   . Atypical chest pain 08/02/2015  . CAD (coronary artery disease)   . Chronic back pain   . COPD (chronic obstructive pulmonary disease) ([facility])   . Coronary artery disease    a. Mild-non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/2014  . GERD (gastroesophageal reflux disease)   . Hypercholesteremia   . Hypertension   . Hypokalemia   . Hypomagnesemia 01/04/2014  . Liver disease   . MI (myocardial infarction)   . Osteoarthritis     Patient Active Problem List   Diagnosis Date Noted  . Chronic pain syndrome 06/05/2016  . Carotid artery stenosis 02/27/2016  . Chronic constipation 02/27/2016  . Chronic nausea 02/27/2016  . Coronary artery disease 02/27/2016  . Hypercholesterolemia 02/27/2016  . Osteoarthritis 02/27/2016  . PTSD (post-traumatic stress disorder) 02/27/2016  . TIA (transient ischemic attack) 02/27/2016  . Long term current use of opiate analgesic 02/27/2016  . Long term prescription opiate use 02/27/2016  . Opiate use 02/27/2016  . Encounter for therapeutic drug level monitoring 02/27/2016  . Encounter for pain management consult 02/27/2016  . Chronic hip pain (Location of Tertiary source of pain) (Bilateral) (L>R) 02/27/2016  . Chronic knee pain (Bilateral) (L>R) 02/27/2016  . Chronic shoulder pain (Bilateral) (L>R) 02/27/2016  . Chronic sacroiliac joint pain  (Bilateral) (L>R) 02/27/2016  . Chronic low back pain (Location of Primary Source of Pain) (Bilateral) (L>R) 02/27/2016  . Chronic lower extremity pain (Location of Secondary source of pain) (Bilateral) (L>R) 02/27/2016  . Osteoarthritis of hip (Bilateral) (L>R) 02/27/2016  . Chronic neck pain (posterior midline) (Bilateral) (L>R) 02/27/2016  . Cervicogenic headache (Bilateral) (L>R) 02/27/2016  . Occipital headache (Bilateral) (L>R) 02/27/2016  . Chronic upper extremity pain (Bilateral) (L>R) 02/27/2016  . Chronic Cervical radicular pain (Bilateral) (L>R) 02/27/2016  . Chronic lumbar radicular pain (Right) (L5 dermatome) 02/27/2016  . Lumbar facet syndrome (Bilateral) (L>R) 02/27/2016  . Long term prescription benzodiazepine use 02/27/2016  . Hypertension   . COPD (chronic obstructive pulmonary disease) ([place]) 10/09/2015  . GERD (gastroesophageal reflux disease) 10/09/2015  . Non-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/2014 08/02/2015  . Hyperlipidemia 08/02/2015  . Costochondritis 08/02/2015  . 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 GAD (generalized anxiety disorder) 09/03/2014  . Narcotic dependence ([place]) 09/03/2014  . PVD (peripheral vascular disease) ([place]) 09/03/2014  . Smoker 09/03/2014  . Cigarette nicotine dependence with nicotine-induced disorder 07/08/2014  . Anemia of chronic disease 03/19/2014  . Narcotic abuse, continuous 03/19/2014  . Polysubstance abuse 03/19/2014  . Hypomagnesemia 01/04/2014  . Chronic cough 09/04/2011  . Sleep apnea, obstructive 09/04/2011    Past Surgical History:  Procedure Laterality Date  . CARDIAC CATHETERIZATION Left 02/22/2016   Procedure: Left Heart Cath and Coronary Angiography;  Surgeon: Yolonda Kida, MD;  Location: [facility];  Service: Cardiovascular;  Laterality: Left;  . hemorroids    . ORIF FEMUR FRACTURE    .  ORIF TIBIA & FIBULA FRACTURES    . OVARY SURGERY      Prior to Admission medications   Medication Sig Start Date End Date  Taking? Authorizing Provider  albuterol (PROVENTIL HFA;VENTOLIN HFA) 108 (90 Base) MCG/ACT inhaler Inhale 2 puffs into the lungs every 4 (four) hours as needed for wheezing or shortness of breath. 08/03/15   Demetrios Loll, MD  aspirin EC 81 MG tablet Take 81 mg by mouth daily.    Historical Provider, MD  atorvastatin (LIPITOR) 20 MG tablet Take 20 mg by mouth at bedtime.    Historical Provider, MD  budesonide-formoterol (SYMBICORT) 160-4.5 MCG/ACT inhaler Inhale 2 puffs into the lungs 2 (two) times daily. 05/31/15   Gregor Hams, MD  busPIRone (BUSPAR) 7.5 MG tablet Take 7.5 mg by mouth 3 (three) times daily.     Historical Provider, MD  dexlansoprazole (DEXILANT) 60 MG capsule Take 60 mg by mouth daily.    Historical Provider, MD  docusate sodium (COLACE) 100 MG capsule Take 200 mg by mouth 2 (two) times daily as needed for mild constipation.    Historical Provider, MD  gabapentin (NEURONTIN) 300 MG capsule Take 2 capsules (600 mg total) by mouth 3 (three) times daily. 08/03/15   Demetrios Loll, MD  linaclotide [facility]) 290 MCG CAPS capsule Take 290 mcg by mouth daily before breakfast.    Historical Provider, MD  lisinopril (PRINIVIL,ZESTRIL) 20 MG tablet Take 40 mg by mouth daily.     Historical Provider, MD  Magnesium Oxide 500 MG CAPS Take 1 capsule (500 mg total) by mouth 2 (two) times daily at 8 am and 10 pm. 04/24/16   Milinda Pointer, MD  meloxicam (MOBIC) 15 MG tablet Take 1 tablet (15 mg total) by mouth daily. 06/05/16 09/03/16  Milinda Pointer, MD  metoprolol tartrate (LOPRESSOR) 25 MG tablet Take 1 tablet (25 mg total) by mouth 2 (two) times daily. 08/03/15   Demetrios Loll, MD  nitroGLYCERIN (NITROSTAT) 0.4 MG SL tablet Place 1 tablet (0.4 mg total) under the tongue every 5 (five) minutes x 3 doses as needed for chest pain. 08/03/15   Demetrios Loll, MD  ondansetron (ZOFRAN ODT) 4 MG disintegrating tablet Take 1 tablet (4 mg total) by mouth every 8 (eight) hours as needed for nausea or vomiting. 06/13/16    Gregor Hams, MD  oxyCODONE-acetaminophen (ROXICET) 5-325 MG tablet Take 1 tablet by mouth every 4 (four) hours as needed for severe pain. 06/13/16   Gregor Hams, MD  potassium chloride (KLOR-CON) 20 MEQ packet Take 20 mEq by mouth 2 (two) times daily.     Historical Provider, MD  RANEXA 500 MG 12 hr tablet Take 1 tablet (500 mg total) by mouth 2 (two) times daily. 08/03/15   Demetrios Loll, MD  tiZANidine (ZANAFLEX) 4 MG tablet Take 2 tablets (8 mg total) by mouth 3 (three) times daily. Patient taking differently: Take 4 mg by mouth 3 (three) times daily.  02/05/16   Victorino Dike, FNP    Allergies Levofloxacin; Flexeril [cyclobenzaprine]; Keflex [cephalexin]; Ketorolac; Toradol [ketorolac tromethamine]; Tramadol; and Zoloft [sertraline hcl]  Family History  Problem Relation Age of Onset  . Heart disease Mother   . Lung cancer Mother   . Ovarian cancer Mother     Social History Social History  Substance Use Topics  . Smoking status: Current Every Day Smoker    Packs/day: 0.50    Types: Cigarettes  . Smokeless tobacco: Never Used  . Alcohol use 0.0 oz/week  Comment: drinks occasionally    Review of Systems Constitutional: No fever/chills Eyes: No visual changes. ENT: No sore throat. Cardiovascular: Denies chest pain. Respiratory: Denies shortness of breath. Gastrointestinal: Positive for abdominal pain and vomiting Genitourinary: Negative for dysuria. Musculoskeletal: Negative for back pain. Skin: Negative for rash. Neurological: Negative for headaches, focal weakness or numbness.  10-point ROS otherwise negative.  ____________________________________________   PHYSICAL EXAM:  VITAL SIGNS: ED Triage Vitals  Enc Vitals Group     BP 06/12/16 2239 126/80     Pulse Rate 06/12/16 2239 76     Resp 06/12/16 2239 18     Temp 06/12/16 2239 97.5 F (36.4 C)     Temp Source 06/12/16 2239 Oral     SpO2 06/12/16 2239 98 %     Weight 06/12/16 2242 197 lb (89.4 kg)      Height 06/12/16 2242 5\' 4"  (1.626 m)     Head Circumference --      Peak Flow --      Pain Score 06/12/16 2242 10     Pain Loc --      Pain Edu? --      Excl. in [place]? --     Constitutional: Alert and oriented. Apparent discomfort  Eyes: Conjunctivae are normal. PERRL. EOMI. Head: Atraumatic. Mouth/Throat: Mucous membranes are moist.  Oropharynx non-erythematous. Neck: No stridor.  No meningeal signs. Cardiovascular: Normal rate, regular rhythm. Good peripheral circulation. Grossly normal heart sounds. Respiratory: Normal respiratory effort.  No retractions. Lungs CTAB. Gastrointestinal: Soft and nontender. No distention.  Musculoskeletal: No lower extremity tenderness nor edema. No gross deformities of extremities. Neurologic:  Normal speech and language. No gross focal neurologic deficits are appreciated.  Skin:  Skin is warm, dry and intact. No rash noted. Psychiatric: Mood and affect are normal. Speech and behavior are normal.  ____________________________________________   LABS (all labs ordered are listed, but only abnormal results are displayed)  Labs Reviewed  COMPREHENSIVE METABOLIC PANEL - Abnormal; Notable for the following:       Result Value   CO2 20 (*)    Calcium 8.8 (*)    AST 98 (*)    ALT 114 (*)    Total Bilirubin <0.1 (*)    All other components within normal limits  CBC - Abnormal; Notable for the following:    WBC 13.0 (*)    RDW 15.3 (*)    All other components within normal limits  URINALYSIS, COMPLETE (UACMP) WITH MICROSCOPIC - Abnormal; Notable for the following:    Color, Urine COLORLESS (*)    APPearance CLEAR (*)    Specific Gravity, Urine 1.002 (*)    Hgb urine dipstick MODERATE (*)    Bacteria, UA RARE (*)    Squamous Epithelial / LPF 0-5 (*)    All other components within normal limits  LIPASE, BLOOD    RADIOLOGY I, [place] Audriana Aldama, personally viewed and evaluated these images (plain radiographs) as part of my medical decision  making, as well as reviewing the written report by the radiologist.  CLINICAL DATA:  Abdominal pain, worsening in severity since yesterday.  EXAM: CT ABDOMEN AND PELVIS WITH CONTRAST  TECHNIQUE: Multidetector CT imaging of the abdomen and pelvis was performed using the standard protocol following bolus administration of intravenous contrast.  CONTRAST:  169mL ISOVUE-300 IOPAMIDOL (ISOVUE-300) INJECTION 61%  COMPARISON:  Ultrasound 05/07/2016.  FINDINGS: Lower chest: Minimal linear scarring in the bases. No acute abnormality.  Hepatobiliary: Marked fatty infiltration of the liver without significant  focal lesion. Gallbladder and bile ducts are unremarkable.  Pancreas: Unremarkable. No pancreatic ductal dilatation or surrounding inflammatory changes.  Spleen: Normal in size without focal abnormality.  Adrenals/Urinary Tract: Adrenal glands are unremarkable. Kidneys are normal, without renal calculi, focal lesion, or hydronephrosis. Bladder is unremarkable.  Stomach/Bowel: Stomach is within normal limits. Appendix is normal. No evidence of bowel wall thickening, distention, or inflammatory changes.  Vascular/Lymphatic: The abdominal aorta is normal in caliber with extensive atherosclerotic calcification. No adenopathy in the abdomen or pelvis.  Reproductive: 3.6 cm right ovarian cyst, not requiring imaging follow-up. Uterus and left ovary are normal.  Other: No acute inflammatory changes in the abdomen or pelvis. No ascites. Small fat containing umbilical hernia.  Musculoskeletal: No significant skeletal lesion.  IMPRESSION: 1. No acute inflammatory changes are evident 2. Marked hepatic steatosis. 3. 3.6 cm right ovarian cyst, not requiring imaging follow-up.   Electronically Signed   By: Andreas Newport M.D.   On: 06/13/2016 01:20 ______________ Procedures    INITIAL IMPRESSION / ASSESSMENT AND PLAN / ED COURSE  Pertinent labs &  imaging results that were available during my care of the patient were reviewed by me and considered in my medical decision making (see chart for details).     Clinical Course     ____________________________________________  FINAL CLINICAL IMPRESSION(S) / ED DIAGNOSES  Final diagnoses:  Acute pancreatitis, unspecified complication status, unspecified pancreatitis type  Nausea vomiting and diarrhea  Ovarian cyst   MEDICATIONS GIVEN DURING THIS VISIT:  Medications  HYDROmorphone (DILAUDID) injection 1 mg (1 mg Intravenous Given 06/12/16 2340)  ondansetron (ZOFRAN) injection 4 mg (4 mg Intravenous Given 06/12/16 2340)  sodium chloride 0.9 % bolus 1,000 mL (0 mLs Intravenous Stopped 06/13/16 0214)  iopamidol (ISOVUE-300) 61 % injection 30 mL (30 mLs Oral Contrast Given 06/12/16 2346)  iopamidol (ISOVUE-300) 61 % injection 100 mL (100 mLs Intravenous Contrast Given 06/13/16 0042)  oxyCODONE-acetaminophen (PERCOCET/ROXICET) 5-325 MG per tablet 2 tablet (2 tablets Oral Given 06/13/16 0239)     NEW OUTPATIENT MEDICATIONS STARTED DURING THIS VISIT:  Discharge Medication List as of 06/13/2016  2:01 AM      Discharge Medication List as of 06/13/2016  2:01 AM      Discharge Medication List as of 06/13/2016  2:01 AM       Note:  This document was prepared using Dragon voice recognition software and may include unintentional dictation errors.    Gregor Hams, MD 06/14/16 [phone]

## 2016-06-13 ENCOUNTER — Encounter: Payer: Self-pay | Admitting: Radiology

## 2016-06-13 ENCOUNTER — Emergency Department: Payer: Medicaid Other

## 2016-06-13 LAB — URINALYSIS, COMPLETE (UACMP) WITH MICROSCOPIC
Bilirubin Urine: NEGATIVE
Glucose, UA: NEGATIVE mg/dL
Ketones, ur: NEGATIVE mg/dL
Leukocytes, UA: NEGATIVE
Nitrite: NEGATIVE
Protein, ur: NEGATIVE mg/dL
Specific Gravity, Urine: 1.002 — ABNORMAL LOW (ref 1.005–1.030)
pH: 6 (ref 5.0–8.0)

## 2016-06-13 MED ORDER — OXYCODONE-ACETAMINOPHEN 5-325 MG PO TABS
ORAL_TABLET | ORAL | Status: AC
  Administered 2016-06-13: 2 via ORAL
  Filled 2016-06-13: qty 2

## 2016-06-13 MED ORDER — ONDANSETRON 4 MG PO TBDP: 4.0000 mg | ORAL_TABLET | Freq: Three times a day (TID) | ORAL | 0 refills | Status: DC | PRN

## 2016-06-13 MED ORDER — OXYCODONE-ACETAMINOPHEN 5-325 MG PO TABS: 1.0000 | ORAL_TABLET | ORAL | 0 refills | Status: DC | PRN

## 2016-06-13 MED ORDER — OXYCODONE-ACETAMINOPHEN 5-325 MG PO TABS
2.0000 | ORAL_TABLET | Freq: Once | ORAL | Status: AC
  Administered 2016-06-13: 2 via ORAL

## 2016-06-13 MED ORDER — IOPAMIDOL (ISOVUE-300) INJECTION 61%
100.0000 mL | Freq: Once | INTRAVENOUS | Status: AC | PRN
  Administered 2016-06-13: 100 mL via INTRAVENOUS

## 2016-06-13 NOTE — ED Notes (Signed)
Discharge instructions reviewed with patient. Questions fielded by this RN. Patient verbalizes understanding of instructions. Patient discharged home in stable condition per Brown MD . No acute distress noted at time of discharge.   

## 2016-06-13 NOTE — ED Notes (Signed)
Pt done with contrast, CT called.

## 2016-06-13 NOTE — ED Notes (Signed)
Dr. Owens Shark at the bedside talking to the patient at this time.

## 2016-06-13 NOTE — ED Notes (Signed)
Pt requesting More pain meds. Dr. Owens Shark made aware.

## 2016-06-26 ENCOUNTER — Ambulatory Visit (HOSPITAL_BASED_OUTPATIENT_CLINIC_OR_DEPARTMENT_OTHER): Payer: Medicaid Other | Admitting: Pain Medicine

## 2016-06-26 ENCOUNTER — Encounter: Payer: Self-pay | Admitting: Pain Medicine

## 2016-06-26 ENCOUNTER — Ambulatory Visit
Admission: RE | Admit: 2016-06-26 | Discharge: 2016-06-26 | Disposition: A | Discharge status: Home / Self Care | Payer: Medicaid Other | Source: Ambulatory Visit | Attending: Pain Medicine | Admitting: Pain Medicine

## 2016-06-26 VITALS — BP 131/98 | HR 75 | Temp 98.7°F | Resp 16 | Ht 64.0 in | Wt 190.0 lb

## 2016-06-26 DIAGNOSIS — G8929 Other chronic pain: Secondary | ICD-10-CM | POA: Diagnosis not present

## 2016-06-26 DIAGNOSIS — M545 Low back pain, unspecified: Secondary | ICD-10-CM

## 2016-06-26 DIAGNOSIS — M533 Sacrococcygeal disorders, not elsewhere classified: Secondary | ICD-10-CM | POA: Insufficient documentation

## 2016-06-26 MED ORDER — ROPIVACAINE HCL 2 MG/ML IJ SOLN
INTRAMUSCULAR | Status: AC
  Administered 2016-06-26: 11:00:00
  Filled 2016-06-26: qty 10

## 2016-06-26 MED ORDER — MIDAZOLAM HCL 5 MG/5ML IJ SOLN
INTRAMUSCULAR | Status: AC
  Filled 2016-06-26: qty 5

## 2016-06-26 MED ORDER — METHYLPREDNISOLONE ACETATE 80 MG/ML IJ SUSP: 80.0000 mg | Freq: Once | INTRAMUSCULAR | Status: DC

## 2016-06-26 MED ORDER — ROPIVACAINE HCL 2 MG/ML IJ SOLN: 9.0000 mL | Freq: Once | INTRAMUSCULAR | Status: DC

## 2016-06-26 MED ORDER — LIDOCAINE HCL (PF) 1 % IJ SOLN
INTRAMUSCULAR | Status: AC
  Administered 2016-06-26: 11:00:00
  Filled 2016-06-26: qty 5

## 2016-06-26 MED ORDER — FENTANYL CITRATE (PF) 100 MCG/2ML IJ SOLN
INTRAMUSCULAR | Status: AC
  Filled 2016-06-26: qty 2

## 2016-06-26 MED ORDER — METHYLPREDNISOLONE ACETATE 80 MG/ML IJ SUSP
INTRAMUSCULAR | Status: AC
  Administered 2016-06-26: 11:00:00
  Filled 2016-06-26: qty 1

## 2016-06-26 NOTE — Progress Notes (Signed)
Safety precautions to be maintained throughout the outpatient stay will include: orient to surroundings, keep bed in low position, maintain call bell within reach at all times, provide assistance with transfer out of bed and ambulation.  

## 2016-06-26 NOTE — Patient Instructions (Signed)

## 2016-06-26 NOTE — Progress Notes (Signed)
Patient's Name: Sara Munoz  MRN: [id number]  Referring Provider: Milinda Pointer, MD  DOB: [birth year]  PCP: [facility]  DOS: 06/26/2016  Note by: Kathlen Brunswick. Dossie Arbour, MD  Service setting: Ambulatory outpatient  Location: [facility] (AMB) Pain Management Facility  Visit type: Procedure  Specialty: Interventional Pain Management  Patient type: Established   Primary Reason for Visit: Interventional Pain Management Treatment. CC: Back Pain (lower, center)  Procedure:  Anesthesia, Analgesia, Anxiolysis:  Type: Diagnostic Sacroiliac Joint Steroid Injection Region: Superior Lumbosacral Region Level: PSIS (Posterior Superior Iliac Spine) Laterality: Bilateral  Type: Local Anesthesia with Moderate (Conscious) Sedation Local Anesthetic: Lidocaine 1% Route: Intravenous (IV) IV Access: Secured Sedation: Meaningful verbal contact was maintained at all times during the procedure  Indication(s): Analgesia and Anxiety  Indications: 1. Chronic sacroiliac joint pain (Bilateral) (L>R)   2. Chronic low back pain (Location of Primary Source of Pain) (Bilateral) (L>R)    Pain Score: Pre-procedure: 4 /10 Post-procedure: 0-No pain/10  Pre-Procedure Assessment:  [name] is a 51 y.o. (year old), female patient, seen today for interventional treatment. She  has a past surgical history that includes ORIF femur fracture; ORIF tibia & fibula fractures; hemorroids; Ovary surgery; and Cardiac catheterization (Left, 02/22/2016).. Her primarily concern today is the Back Pain (lower, center) The primary encounter diagnosis was Chronic sacroiliac joint pain (Bilateral) (L>R). A diagnosis of Chronic low back pain (Location of Primary Source of Pain) (Bilateral) (L>R) was also pertinent to this visit.  Pain Type: Chronic pain Pain Location: Back Pain Orientation: Lower, Medial Pain Descriptors / Indicators: Sharp Pain Frequency: Constant  Date of Last Visit: 06/05/16 Service Provided on Last Visit:  Evaluation  Coagulation Parameters Lab Results  Component Value Date   PLT 242 06/12/2016   Verification of the correct person, correct site (including marking of site), and correct procedure were performed and confirmed by the patient.  Consent: Before the procedure and under the influence of no sedative(s), amnesic(s), or anxiolytics, the patient was informed of the treatment options, risks and possible complications. To fulfill our ethical and legal obligations, as recommended by the American Medical Association's Code of Ethics, I have informed the patient of my clinical impression; the nature and purpose of the treatment or procedure; the risks, benefits, and possible complications of the intervention; the alternatives, including doing nothing; the risk(s) and benefit(s) of the alternative treatment(s) or procedure(s); and the risk(s) and benefit(s) of doing nothing. The patient was provided information about the general risks and possible complications associated with the procedure. These may include, but are not limited to: failure to achieve desired goals, infection, bleeding, organ or nerve damage, allergic reactions, paralysis, and death. In addition, the patient was informed of those risks and complications associated to the procedure, such as failure to decrease pain; infection; bleeding; organ or nerve damage with subsequent damage to sensory, motor, and/or autonomic systems, resulting in permanent pain, numbness, and/or weakness of one or several areas of the body; allergic reactions; (i.e.: anaphylactic reaction); and/or death. Furthermore, the patient was informed of those risks and complications associated with the medications. These include, but are not limited to: allergic reactions (i.e.: anaphylactic or anaphylactoid reaction(s)); adrenal axis suppression; blood sugar elevation that in diabetics may result in ketoacidosis or comma; water retention that in patients with history of  congestive heart failure may result in shortness of breath, pulmonary edema, and decompensation with resultant heart failure; weight gain; swelling or edema; medication-induced neural toxicity; particulate matter embolism and blood vessel occlusion  with resultant organ, and/or nervous system infarction; and/or aseptic necrosis of one or more joints. Finally, the patient was informed that Medicine is not an exact science; therefore, there is also the possibility of unforeseen or unpredictable risks and/or possible complications that may result in a catastrophic outcome. The patient indicated having understood very clearly. We have given the patient no guarantees and we have made no promises. Enough time was given to the patient to ask questions, all of which were answered to the patient's satisfaction. Ms. Ulrey has indicated that she wanted to continue with the procedure.  Consent Attestation: I, the ordering provider, attest that I have discussed with the patient the benefits, risks, side-effects, alternatives, likelihood of achieving goals, and potential problems during recovery for the procedure that I have provided informed consent.  Pre-Procedure Preparation:  Safety Precautions: Allergies reviewed. The patient was asked about blood thinners, or active infections, both of which were denied. The patient was asked to confirm the procedure and laterality, before marking the site, and again before commencing the procedure. Appropriate site, procedure, and patient were confirmed by following the Joint Commission's Universal Protocol (UP.01.01.01), in the form of a "Time Out". The patient was asked to participate by confirming the accuracy of the "Time Out" information. Patient was assessed for positional comfort and pressure points before starting the procedure. Allergies: She is allergic to levofloxacin; flexeril [cyclobenzaprine]; keflex [cephalexin]; ketorolac; toradol [ketorolac tromethamine]; tramadol;  and zoloft [sertraline hcl]. Allergy Precautions: None required Infection Control Precautions: Sterile technique used. Standard Universal Precautions were taken as recommended by the Department of [facility] for Disease Control and Prevention (CDC). Standard pre-surgical skin prep was conducted. Respiratory hygiene and cough etiquette was practiced. Hand hygiene observed. Safe injection practices and needle disposal techniques followed. SDV (single dose vial) medications used. Medications properly checked for expiration dates and contaminants. Personal protective equipment (PPE) used as per protocol. Monitoring:  As per clinic protocol. Vitals:   06/26/16 1115 06/26/16 1120 06/26/16 1125 06/26/16 1128  BP: (!) 138/96 (!) 150/99 (!) 158/92 136/82  Pulse: 75 83 82 75  Resp: 12 14 16 10   Temp:    98.7 F (37.1 C)  TempSrc:      SpO2: 96% 96% 97% 97%  Weight:      Height:      Calculated BMI: Body mass index is 32.61 kg/m. Time-out: "Time-out" completed before starting procedure, as per protocol.  Description of Procedure Process:  Time-out: "Time-out" completed before starting procedure, as per protocol. Position: Prone Target Area: Superior, posterior, aspect of the sacroiliac fissure Approach: Posterior, paraspinal, ipsilateral approach. Area Prepped: Entire Lower Lumbosacral Region Prepping solution: ChloraPrep (2% chlorhexidine gluconate and 70% isopropyl alcohol) Safety Precautions: Aspiration looking for blood return was conducted prior to all injections. At no point did we inject any substances, as a needle was being advanced. No attempts were made at seeking any paresthesias. Safe injection practices and needle disposal techniques used. Medications properly checked for expiration dates. SDV (single dose vial) medications used. Description of the Procedure: Protocol guidelines were followed. The patient was placed in position over the procedure table. The target area was  identified and the area prepped in the usual manner. Skin & deeper tissues infiltrated with local anesthetic. Appropriate amount of time allowed to pass for local anesthetics to take effect. The procedure needle was advanced under fluoroscopic guidance into the sacroiliac joint until a firm endpoint was obtained. Proper needle placement secured. Negative aspiration confirmed. Solution injected in intermittent fashion,  asking for systemic symptoms every 0.5cc of injectate. The needles were then removed and the area cleansed, making sure to leave some of the prepping solution back to take advantage of its long term bactericidal properties. EBL: None Materials & Medications:  Needle(s) Type: Epidural needle Gauge: 22G Length: 3.5-in Medication(s): We administered lidocaine (PF), ropivacaine (PF) 2 mg/mL (0.2%), and methylPREDNISolone acetate. Please see chart orders for dosing details.  Imaging Guidance (Non-Spinal):  Type of Imaging Technique: Fluoroscopy Guidance (Non-Spinal) Indication(s): Assistance in needle guidance and placement for procedures requiring needle placement in or near specific anatomical locations not easily accessible without such assistance. Exposure Time: Please see nurses notes. Contrast: None used. Fluoroscopic Guidance: I was personally present during the use of fluoroscopy. "Tunnel Vision Technique" used to obtain the best possible view of the target area. Parallax error corrected before commencing the procedure. "Direction-depth-direction" technique used to introduce the needle under continuous pulsed fluoroscopy. Once target was reached, antero-posterior, oblique, and lateral fluoroscopic projection used confirm needle placement in all planes. Images permanently stored in EMR. Interpretation: No contrast injected. I personally interpreted the imaging intraoperatively. Adequate needle placement confirmed in multiple planes. Permanent images saved into the patient's  record.  Antibiotic Prophylaxis:  Indication(s): No indications identified. Type:  Antibiotics Given (last 72 hours)    None      Post-operative Assessment:  Complications: No immediate post-treatment complications observed by team, or reported by patient. Disposition: The patient tolerated the entire procedure well. A repeat set of vitals were taken after the procedure and the patient was kept under observation following institutional policy, for this type of procedure. Post-procedural neurological assessment was performed, showing return to baseline, prior to discharge. The patient was provided with post-procedure discharge instructions, including a section on how to identify potential problems. Should any problems arise concerning this procedure, the patient was given instructions to immediately contact us, at any time, without hesitation. In any case, we plan to contact the patient by telephone for a follow-up status report regarding this interventional procedure. Comments:  No additional relevant information.  Plan of Care  Discharge to: Discharge home  Medications ordered for procedure: Meds ordered this encounter  Medications  . methylPREDNISolone acetate (DEPO-MEDROL) injection 80 mg  . ropivacaine (PF) 2 mg/mL (0.2%) (NAROPIN) injection 9 mL  . lidocaine (PF) (XYLOCAINE) 1 % injection    GARNER, CYNTHIA: cabinet override  . ropivacaine (PF) 2 mg/mL (0.2%) (NAROPIN) 2 MG/ML injection    GARNER, CYNTHIA: cabinet override  . methylPREDNISolone acetate (DEPO-MEDROL) 80 MG/ML injection    GARNER, CYNTHIA: cabinet override  . fentaNYL (SUBLIMAZE) 100 MCG/2ML injection    GARNER, CYNTHIA: cabinet override  . midazolam (VERSED) 5 MG/5ML injection    GARNER, CYNTHIA: cabinet override   Medications administered: (For more details, see medical record) We administered lidocaine (PF), ropivacaine (PF) 2 mg/mL (0.2%), and methylPREDNISolone acetate. Lab-work, Procedure(s), & Referral(s)  Ordered: Orders Placed This Encounter  Procedures  . DG C-Arm 1-60 Min-No Report   Imaging Ordered: Results for orders placed in visit on 04/29/16  DG C-Arm 1-60 Min-No Report   Narrative Fluoroscopy was utilized by the requesting physician.  No radiographic  interpretation.    New Prescriptions   No medications on file   Physician-requested Follow-up:  Return in about 2 weeks (around 07/10/2016) for Post-Procedure evaluation.  Future Appointments Date Time Provider [place]  07/22/2016 1:30 PM [name], MD [facility] None   Primary Care Physician: [facility] Location: [facility]  Note by: Jatavia Keltner A. Dossie Arbour, M.D, DABA, DABAPM, DABPM, DABIPP, FIPP Date: 06/26/16; Time: 11:33 AM  Disclaimer:  Medicine is not an Chief Strategy Officer. The only guarantee in medicine is that nothing is guaranteed. It is important to note that the decision to proceed with this intervention was based on the information collected from the patient. The Data and conclusions were drawn from the patient's questionnaire, the interview, and the physical examination. Because the information was provided in large part by the patient, it cannot be guaranteed that it has not been purposely or unconsciously manipulated. Every effort has been made to obtain as much relevant data as possible for this evaluation. It is important to note that the conclusions that lead to this procedure are derived in large part from the available data. Always take into account that the treatment will also be dependent on availability of resources and existing treatment guidelines, considered by other Pain Management Practitioners as being common knowledge and practice, at the time of the intervention. For Medico-Legal purposes, it is also important to point out that variation in procedural techniques and pharmacological choices are the acceptable norm. The indications, contraindications, technique,  and results of the above procedure should only be interpreted and judged by a Board-Certified Interventional Pain Specialist with extensive familiarity and expertise in the same exact procedure and technique. Attempts at providing opinions without similar or greater experience and expertise than that of the treating physician will be considered as inappropriate and unethical, and shall result in a formal complaint to the state medical board and applicable specialty societies.  Instructions provided at this appointment: Patient Instructions  Pain Management Discharge Instructions  General Discharge Instructions :  If you need to reach your doctor call: Monday-Friday 8:00 am - 4:00 pm at [phone] or toll free [phone].  After clinic hours [phone] to have operator reach doctor.  Bring all of your medication bottles to all your appointments in the pain clinic.  To cancel or reschedule your appointment with Pain Management please remember to call 24 hours in advance to avoid a fee.  Refer to the educational materials which you have been given on: General Risks, I had my Procedure. Discharge Instructions, Post Sedation.  Post Procedure Instructions:  The drugs you were given will stay in your system until tomorrow, so for the next 24 hours you should not drive, make any legal decisions or drink any alcoholic beverages.  You may eat anything you prefer, but it is better to start with liquids then soups and crackers, and gradually work up to solid foods.  Please notify your doctor immediately if you have any unusual bleeding, trouble breathing or pain that is not related to your normal pain.  Depending on the type of procedure that was done, some parts of your body may feel week and/or numb.  This usually clears up by tonight or the next day.  Walk with the use of an assistive device or accompanied by an adult for the 24 hours.  You may use ice on the affected area for the first 24  hours.  Put ice in a Ziploc bag and cover with a towel and place against area 15 minutes on 15 minutes off.  You may switch to heat after 24 hours.

## 2016-06-27 ENCOUNTER — Telehealth: Payer: Self-pay | Admitting: *Deleted

## 2016-06-27 NOTE — Telephone Encounter (Signed)
Attempted to call patient for post procedure follow-up. Message left. 

## 2016-07-22 ENCOUNTER — Ambulatory Visit: Payer: Medicaid Other | Admitting: Gastroenterology

## 2016-07-30 NOTE — Progress Notes (Deleted)
Patient's Name: Sara Munoz  MRN: [id number]  Referring Provider: [facility]  DOB: 1966/03/18  PCP: [place]  DOS: 07/31/2016  Note by: Kathlen Brunswick. Dossie Arbour, MD  Service setting: Ambulatory outpatient  Specialty: Interventional Pain Management  Location: [facility] (AMB) Pain Management Facility    Patient type: Established   Primary Reason(s) for Visit: Encounter for post-procedure evaluation of chronic illness with mild to moderate exacerbation CC: No chief complaint on file.  HPI  [name] is a 51 y.o. year old, female patient, who comes today for a post-procedure evaluation. She has Non-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/2014; Hyperlipidemia; Costochondritis; COPD (chronic obstructive pulmonary disease) ([place]); GERD (gastroesophageal reflux disease); Hypertension; Anemia of chronic disease; Carotid artery stenosis; Chronic constipation; Chronic cough; Chronic nausea; Cigarette nicotine dependence with nicotine-induced disorder; Coronary artery disease; Drug abuse, IV; GAD (generalized anxiety disorder); Hypercholesterolemia; Hypomagnesemia; Narcotic abuse, continuous; Narcotic dependence ([place]); Osteoarthritis; Polysubstance abuse; PTSD (post-traumatic stress disorder); PVD (peripheral vascular disease) ([place]); Sleep apnea, obstructive; Smoker; TIA (transient ischemic attack); Long term current use of opiate analgesic; Long term prescription opiate use; Opiate use; Encounter for therapeutic drug level monitoring; Encounter for pain management consult; Chronic hip pain (Location of Tertiary source of pain) (Bilateral) (L>R); Chronic knee pain (Bilateral) (L>R); Chronic shoulder pain (Bilateral) (L>R); Chronic sacroiliac joint pain (Bilateral) (L>R); Chronic low back pain (Location of Primary Source of Pain) (Bilateral) (L>R); Chronic lower extremity pain (Location of Secondary source of pain) (Bilateral) (L>R); Osteoarthritis of hip (Bilateral) (L>R); Chronic neck pain (posterior  midline) (Bilateral) (L>R); Cervicogenic headache (Bilateral) (L>R); Occipital headache (Bilateral) (L>R); Chronic upper extremity pain (Bilateral) (L>R); Chronic Cervical radicular pain (Bilateral) (L>R); Chronic lumbar radicular pain (Right) (L5 dermatome); Lumbar facet syndrome (Bilateral) (L>R); Long term prescription benzodiazepine use; and Chronic pain syndrome on her problem list. Her primarily concern today is the No chief complaint on file.  Pain Assessment: Self-Reported Pain Score:  /10             Reported level is compatible with observation.          [name] comes in today for post-procedure evaluation after the treatment done on 06/26/2016.  Further details on both, my assessment(s), as well as the proposed treatment plan, please see below.  Post-Procedure Assessment  06/26/2016 Procedure: Diagnostic bilateral sacroiliac joint block under fluoroscopic guidance and IV sedation Post-procedure pain score: 0/10 (100% relief) Influential Factors: BMI:   Intra-procedural challenges: None observed Assessment challenges: None detected         Post-procedural side-effects, adverse reactions, or complications: None reported Reported issues: None  Sedation: Sedation provided. When no sedatives are used, the analgesic levels obtained are directly associated to the effectiveness of the local anesthetics. However, when sedation is provided, the level of analgesia obtained during the initial 1 hour following the intervention, is believed to be the result of a combination of factors. These factors may include, but are not limited to: 1. The effectiveness of the local anesthetics used. 2. The effects of the analgesic(s) and/or anxiolytic(s) used. 3. The degree of discomfort experienced by the patient at the time of the procedure. 4. The patients ability and reliability in recalling and recording the events. 5. The presence and influence of possible secondary gains and/or psychosocial  factors. Reported result: Relief experienced during the 1st hour after the procedure:   (Ultra-Short Term Relief) Interpretative annotation: Analgesia during this period is likely to be Local Anesthetic and/or IV Sedative (Analgesic/Anxiolitic) related.  Effects of local anesthetic: The analgesic effects attained during this period are directly associated to the localized infiltration of local anesthetics and therefore cary significant diagnostic value as to the etiological location, or anatomical origin, of the pain. Expected duration of relief is directly dependent on the pharmacodynamics of the local anesthetic used. Long-acting (4-6 hours) anesthetics used.  Reported result: Relief during the next 4 to 6 hour after the procedure:   (Short-Term Relief) Interpretative annotation: Complete relief would suggest area to be the source of the pain.          Long-term benefit: Defined as the period of time past the expected duration of local anesthetics. With the possible exception of prolonged sympathetic blockade from the local anesthetics, benefits during this period are typically attributed to, or associated with, other factors such as analgesic sensory neuropraxia, antiinflammatory effects, or beneficial biochemical changes provided by agents other than the local anesthetics Reported result: Extended relief following procedure:   (Long-Term Relief) Interpretative annotation: Good relief. This could suggest inflammation to be a significant component in the etiology to the pain.          Current benefits: Defined as persistent relief that continues at this point in time.   Reported results: Treated area: *** %       Interpretative annotation: Ongoing benefits would suggest effective therapeutic approach  Interpretation: Results would suggest a successful diagnostic intervention.          Laboratory Chemistry  Inflammation Markers Lab Results  Component Value Date   ESRSEDRATE 7  03/01/2016   CRP 1.7 (H) 03/01/2016   Renal Function Lab Results  Component Value Date   BUN 8 06/12/2016   CREATININE 0.79 06/12/2016   GFRAA >60 06/12/2016   GFRNONAA >60 06/12/2016   Hepatic Function Lab Results  Component Value Date   AST 98 (H) 06/12/2016   ALT 114 (H) 06/12/2016   ALBUMIN 3.9 06/12/2016   Electrolytes Lab Results  Component Value Date   NA 137 06/12/2016   K 3.8 06/12/2016   CL 109 06/12/2016   CALCIUM 8.8 (L) 06/12/2016   MG 1.6 (L) 03/01/2016   Pain Modulating Vitamins Lab Results  Component Value Date   25OHVITD1 32 03/01/2016   25OHVITD2 <1.0 03/01/2016   25OHVITD3 32 03/01/2016   VITAMINB12 491 03/01/2016   Coagulation Parameters Lab Results  Component Value Date   PLT 242 06/12/2016   Cardiovascular Lab Results  Component Value Date   BNP 24.0 08/01/2015   HGB 13.8 06/12/2016   HCT 40.7 06/12/2016   Note: Lab results reviewed.  Recent Diagnostic Imaging Review  Dg C-arm 1-60 Min-no Report  Result Date: 06/26/2016 There is no Radiologist interpretation  for this exam.  Note: Imaging results reviewed.          Meds  The patient has a current medication list which includes the following prescription(s): albuterol, aspirin ec, atorvastatin, budesonide-formoterol, buspirone, dexlansoprazole, docusate sodium, gabapentin, linaclotide, lisinopril, magnesium oxide, meloxicam, metoprolol tartrate, nitroglycerin, ondansetron, oxycodone-acetaminophen, potassium chloride, ranexa, and tizanidine, and the following Facility-Administered Medications: methylprednisolone acetate, orphenadrine, and ropivacaine (pf) 2 mg/ml (0.2%).  Current Outpatient Prescriptions on File Prior to Visit  Medication Sig  . albuterol (PROVENTIL HFA;VENTOLIN HFA) 108 (90 Base) MCG/ACT inhaler Inhale 2 puffs into the lungs every 4 (four) hours as needed for wheezing or shortness of breath.  Marland Kitchen aspirin EC 81 MG tablet Take 81 mg by mouth daily.  Marland Kitchen atorvastatin  (LIPITOR) 20 MG tablet Take 20 mg by mouth  at bedtime.  . budesonide-formoterol (SYMBICORT) 160-4.5 MCG/ACT inhaler Inhale 2 puffs into the lungs 2 (two) times daily.  . busPIRone (BUSPAR) 7.5 MG tablet Take 7.5 mg by mouth 3 (three) times daily.   Marland Kitchen dexlansoprazole (DEXILANT) 60 MG capsule Take 60 mg by mouth daily.  Marland Kitchen docusate sodium (COLACE) 100 MG capsule Take 200 mg by mouth 2 (two) times daily as needed for mild constipation.  . gabapentin (NEURONTIN) 300 MG capsule Take 2 capsules (600 mg total) by mouth 3 (three) times daily.  Marland Kitchen linaclotide (LINZESS) 290 MCG CAPS capsule Take 290 mcg by mouth daily before breakfast.  . lisinopril (PRINIVIL,ZESTRIL) 20 MG tablet Take 40 mg by mouth daily.   . Magnesium Oxide 500 MG CAPS Take 1 capsule (500 mg total) by mouth 2 (two) times daily at 8 am and 10 pm.  . meloxicam (MOBIC) 15 MG tablet Take 1 tablet (15 mg total) by mouth daily.  . metoprolol tartrate (LOPRESSOR) 25 MG tablet Take 1 tablet (25 mg total) by mouth 2 (two) times daily.  . nitroGLYCERIN (NITROSTAT) 0.4 MG SL tablet Place 1 tablet (0.4 mg total) under the tongue every 5 (five) minutes x 3 doses as needed for chest pain.  Marland Kitchen ondansetron (ZOFRAN ODT) 4 MG disintegrating tablet Take 1 tablet (4 mg total) by mouth every 8 (eight) hours as needed for nausea or vomiting.  Marland Kitchen oxyCODONE-acetaminophen (ROXICET) 5-325 MG tablet Take 1 tablet by mouth every 4 (four) hours as needed for severe pain.  . potassium chloride (KLOR-CON) 20 MEQ packet Take 20 mEq by mouth 2 (two) times daily.   Marland Kitchen RANEXA 500 MG 12 hr tablet Take 1 tablet (500 mg total) by mouth 2 (two) times daily.  Marland Kitchen tiZANidine (ZANAFLEX) 4 MG tablet Take 2 tablets (8 mg total) by mouth 3 (three) times daily. (Patient taking differently: Take 4 mg by mouth 3 (three) times daily. )   Current Facility-Administered Medications on File Prior to Visit  Medication  . methylPREDNISolone acetate (DEPO-MEDROL) injection 80 mg  . orphenadrine  (NORFLEX) injection 60 mg  . ropivacaine (PF) 2 mg/mL (0.2%) (NAROPIN) injection 9 mL   ROS  Constitutional: Denies any fever or chills Gastrointestinal: No reported hemesis, hematochezia, vomiting, or acute GI distress Musculoskeletal: Denies any acute onset joint swelling, redness, loss of ROM, or weakness Neurological: No reported episodes of acute onset apraxia, aphasia, dysarthria, agnosia, amnesia, paralysis, loss of coordination, or loss of consciousness  Allergies  Ms. Mungin is allergic to levofloxacin; flexeril [cyclobenzaprine]; keflex [cephalexin]; ketorolac; toradol [ketorolac tromethamine]; tramadol; and zoloft [sertraline hcl].  [place]  Drug: Ms. Nelton  reports that she uses drugs, including Marijuana. Alcohol:  reports that she drinks alcohol. Tobacco:  reports that she has been smoking Cigarettes.  She has been smoking about 0.50 packs per day. She has never used smokeless tobacco. Medical:  has a past medical history of Anxiety; Asthma; Atypical chest pain (08/02/2015); CAD (coronary artery disease); Chronic back pain; COPD (chronic obstructive pulmonary disease) ([place]); Coronary artery disease; GERD (gastroesophageal reflux disease); Hypercholesteremia; Hypertension; Hypokalemia; Hypomagnesemia (01/04/2014); Liver disease; MI (myocardial infarction); and Osteoarthritis. Family: family history includes Heart disease in her mother; Lung cancer in her mother; Ovarian cancer in her mother.  Past Surgical History:  Procedure Laterality Date  . CARDIAC CATHETERIZATION Left 02/22/2016   Procedure: Left Heart Cath and Coronary Angiography;  Surgeon: Yolonda Kida, MD;  Location: [facility];  Service: Cardiovascular;  Laterality: Left;  . hemorroids    .  ORIF FEMUR FRACTURE    . ORIF TIBIA & FIBULA FRACTURES    . OVARY SURGERY     Constitutional Exam  General appearance: Well nourished, well developed, and well hydrated. In no apparent acute distress There were no  vitals filed for this visit. BMI Assessment: Estimated body mass index is 32.61 kg/m as calculated from the following:   Height as of 06/26/16: 5\' 4"  (1.626 m).   Weight as of 06/26/16: 190 lb (86.2 kg).  BMI interpretation table: BMI level Category Range association with higher incidence of chronic pain  <18 kg/m2 Underweight   18.5-24.9 kg/m2 Ideal body weight   25-29.9 kg/m2 Overweight Increased incidence by 20%  30-34.9 kg/m2 Obese (Class I) Increased incidence by 68%  35-39.9 kg/m2 Severe obesity (Class II) Increased incidence by 136%  >40 kg/m2 Extreme obesity (Class III) Increased incidence by 254%   BMI Readings from Last 4 Encounters:  06/26/16 32.61 kg/m  06/12/16 33.81 kg/m  06/10/16 33.81 kg/m  06/05/16 31.76 kg/m   Wt Readings from Last 4 Encounters:  06/26/16 190 lb (86.2 kg)  06/12/16 197 lb (89.4 kg)  06/10/16 197 lb (89.4 kg)  06/05/16 185 lb (83.9 kg)  Psych/Mental status: Alert, oriented x 3 (person, place, & time)       Eyes: PERLA Respiratory: No evidence of acute respiratory distress  Cervical Spine Exam  Inspection: No masses, redness, or swelling Alignment: Symmetrical Functional ROM: Unrestricted ROM Stability: No instability detected Muscle strength & Tone: Functionally intact Sensory: Unimpaired Palpation: Non-contributory  Upper Extremity (UE) Exam    Side: Right upper extremity  Side: Left upper extremity  Inspection: No masses, redness, swelling, or asymmetry. No contractures  Inspection: No masses, redness, swelling, or asymmetry. No contractures  Functional ROM: Unrestricted ROM          Functional ROM: Unrestricted ROM          Muscle strength & Tone: Functionally intact  Muscle strength & Tone: Functionally intact  Sensory: Unimpaired  Sensory: Unimpaired  Palpation: Euthermic  Palpation: Euthermic  Specialized Test(s): Deferred         Specialized Test(s): Deferred          Thoracic Spine Exam  Inspection: No masses, redness, or  swelling Alignment: Symmetrical Functional ROM: Unrestricted ROM Stability: No instability detected Sensory: Unimpaired Muscle strength & Tone: Functionally intact Palpation: Non-contributory  Lumbar Spine Exam  Inspection: No masses, redness, or swelling Alignment: Symmetrical Functional ROM: Unrestricted ROM Stability: No instability detected Muscle strength & Tone: Functionally intact Sensory: Unimpaired Palpation: Non-contributory Provocative Tests: Lumbar Hyperextension and rotation test: evaluation deferred today       Patrick's Maneuver: evaluation deferred today              Gait & Posture Assessment  Ambulation: Unassisted Gait: Relatively normal for age and body habitus Posture: WNL   Lower Extremity Exam    Side: Right lower extremity  Side: Left lower extremity  Inspection: No masses, redness, swelling, or asymmetry. No contractures  Inspection: No masses, redness, swelling, or asymmetry. No contractures  Functional ROM: Unrestricted ROM          Functional ROM: Unrestricted ROM          Muscle strength & Tone: Functionally intact  Muscle strength & Tone: Functionally intact  Sensory: Unimpaired  Sensory: Unimpaired  Palpation: No palpable anomalies  Palpation: No palpable anomalies   Assessment  Primary Diagnosis & Pertinent Problem List: There were no encounter diagnoses.  Status Diagnosis  Controlled Controlled Controlled No diagnosis found.   Plan of Care  Pharmacotherapy (Medications Ordered): No orders of the defined types were placed in this encounter.  New Prescriptions   No medications on file   Medications administered today: Ms. Buchman had no medications administered during this visit. Lab-work, procedure(s), and/or referral(s): No orders of the defined types were placed in this encounter.  Imaging and/or referral(s): None  Interventional therapies: Planned, scheduled, and/or pending:   Not at this time.   Considering:   Diagnostic  bilateral sacroiliac joint block Possible bilateral sacroiliac joint radiofrequency ablation Diagnostic right-sided L4-5 interlaminar lumbar epidural steroid injection Diagnostic right-sided L5-S1 transforaminal epidural steroid injection Diagnostic bilateral intra-articular hip joint injection Diagnostic bilateral up to date her and femoral nerve articular branch blocks Possible bilateral hip joint radiofrequency ablation Diagnostic bilateral cervical facet block Possible bilateral cervical facet radiofrequency ablation Diagnostic left-sided cervical epidural steroid injection Diagnostic bilateral intra-articular shoulder joint injection Diagnostic bilateral suprascapular nerve block Possible bilateral suprascapular nerve radiofrequency ablation Diagnostic bilateral intra-articular knee joint injection with local anesthetic and steroids Possible series of 5 intra-articular Hyalgan knee injections Diagnostic bilateral genicular nerve blocks Possible bilateral genicular nerve radiofrequency ablation Diagnostic bilateral greater occipital nerve blocks Possible bilateral greater occipital nerve radiofrequency ablation   Palliative PRN treatment(s):   None at this time.    Provider-requested follow-up: No Follow-up on file.  Future Appointments Date Time Provider [place]  07/31/2016 1:30 PM Milinda Pointer, MD [facility] None  08/07/2016 2:30 PM [name], MD [facility] None   Primary Care Physician: [facility] Location: [facility] Outpatient Pain Management Facility Note by: Kathlen Brunswick. Dossie Arbour, M.D, DABA, DABAPM, DABPM, DABIPP, FIPP Date: 07/31/2016; Time: 5:02 PM  Pain Score Disclaimer: We use the NRS-11 scale. This is a self-reported, subjective measurement of pain severity with only modest accuracy. It is used primarily to identify changes within a particular patient. It must be understood that outpatient pain scales are significantly less accurate that those  used for research, where they can be applied under ideal controlled circumstances with minimal exposure to variables. In reality, the score is likely to be a combination of pain intensity and pain affect, where pain affect describes the degree of emotional arousal or changes in action readiness caused by the sensory experience of pain. Factors such as social and work situation, setting, emotional state, anxiety levels, expectation, and prior pain experience may influence pain perception and show large inter-individual differences that may also be affected by time variables.  Patient instructions provided during this appointment: There are no Patient Instructions on file for this visit.

## 2016-07-30 NOTE — Progress Notes (Signed)
NOTE: This forensic urine drug screen (UDS) test was conducted using a state-of-the-art ultra high performance liquid chromatography and mass spectrometry system (UPLC/MS-MS), the most sophisticated and accurate method available. UPLC/MS-MS is 1,000 times more precise and accurate than standard gas chromatography and mass spectrometry (GC/MS). This system can analyze 26 drug categories and 180 drug compounds.  Unreported substance: Codeine  The findings of this UDT were reported as abnormal due to inconsistencies with expected results. An unreported substance was identified in the sample. Expectations were based on the medication history provided by the patient at the time of sample collection.  These results may suggest one of the following possibilities:  1). The use of multiple providers, suggesting the illegal practice of "Doctor Shopping", in violation of [place] Statutes, as well as our medication agreement.  2). The use of unsanctioned and possibly illegal substances, in violation of [place] Statutes, as well as our medication agreement. 3). Inaccurate list of reported substances.

## 2016-07-31 ENCOUNTER — Ambulatory Visit: Payer: Medicaid Other | Attending: Pain Medicine | Admitting: Pain Medicine

## 2016-07-31 ENCOUNTER — Encounter: Payer: Self-pay | Admitting: Pain Medicine

## 2016-07-31 ENCOUNTER — Ambulatory Visit: Payer: Medicaid Other | Admitting: Pain Medicine

## 2016-07-31 VITALS — BP 171/96 | HR 63 | Temp 98.6°F | Resp 20 | Ht 64.0 in | Wt 195.0 lb

## 2016-07-31 DIAGNOSIS — I10 Essential (primary) hypertension: Secondary | ICD-10-CM | POA: Insufficient documentation

## 2016-07-31 DIAGNOSIS — F17210 Nicotine dependence, cigarettes, uncomplicated: Secondary | ICD-10-CM | POA: Diagnosis not present

## 2016-07-31 DIAGNOSIS — M533 Sacrococcygeal disorders, not elsewhere classified: Secondary | ICD-10-CM | POA: Diagnosis not present

## 2016-07-31 DIAGNOSIS — M25562 Pain in left knee: Secondary | ICD-10-CM | POA: Insufficient documentation

## 2016-07-31 DIAGNOSIS — M1288 Other specific arthropathies, not elsewhere classified, other specified site: Secondary | ICD-10-CM

## 2016-07-31 DIAGNOSIS — I6529 Occlusion and stenosis of unspecified carotid artery: Secondary | ICD-10-CM | POA: Insufficient documentation

## 2016-07-31 DIAGNOSIS — J449 Chronic obstructive pulmonary disease, unspecified: Secondary | ICD-10-CM | POA: Insufficient documentation

## 2016-07-31 DIAGNOSIS — M25511 Pain in right shoulder: Secondary | ICD-10-CM | POA: Diagnosis not present

## 2016-07-31 DIAGNOSIS — M5416 Radiculopathy, lumbar region: Secondary | ICD-10-CM

## 2016-07-31 DIAGNOSIS — G894 Chronic pain syndrome: Secondary | ICD-10-CM | POA: Insufficient documentation

## 2016-07-31 DIAGNOSIS — D638 Anemia in other chronic diseases classified elsewhere: Secondary | ICD-10-CM | POA: Diagnosis not present

## 2016-07-31 DIAGNOSIS — I219 Acute myocardial infarction, unspecified: Secondary | ICD-10-CM | POA: Diagnosis not present

## 2016-07-31 DIAGNOSIS — K5909 Other constipation: Secondary | ICD-10-CM | POA: Diagnosis not present

## 2016-07-31 DIAGNOSIS — M940 Chondrocostal junction syndrome [Tietze]: Secondary | ICD-10-CM | POA: Insufficient documentation

## 2016-07-31 DIAGNOSIS — I739 Peripheral vascular disease, unspecified: Secondary | ICD-10-CM | POA: Diagnosis not present

## 2016-07-31 DIAGNOSIS — M79604 Pain in right leg: Secondary | ICD-10-CM

## 2016-07-31 DIAGNOSIS — M5412 Radiculopathy, cervical region: Secondary | ICD-10-CM | POA: Diagnosis not present

## 2016-07-31 DIAGNOSIS — M545 Low back pain, unspecified: Secondary | ICD-10-CM

## 2016-07-31 DIAGNOSIS — Z7982 Long term (current) use of aspirin: Secondary | ICD-10-CM | POA: Insufficient documentation

## 2016-07-31 DIAGNOSIS — F4310 Post-traumatic stress disorder, unspecified: Secondary | ICD-10-CM | POA: Insufficient documentation

## 2016-07-31 DIAGNOSIS — M25512 Pain in left shoulder: Secondary | ICD-10-CM | POA: Insufficient documentation

## 2016-07-31 DIAGNOSIS — K219 Gastro-esophageal reflux disease without esophagitis: Secondary | ICD-10-CM | POA: Diagnosis not present

## 2016-07-31 DIAGNOSIS — I252 Old myocardial infarction: Secondary | ICD-10-CM | POA: Insufficient documentation

## 2016-07-31 DIAGNOSIS — M1610 Unilateral primary osteoarthritis, unspecified hip: Secondary | ICD-10-CM | POA: Diagnosis not present

## 2016-07-31 DIAGNOSIS — M79605 Pain in left leg: Secondary | ICD-10-CM

## 2016-07-31 DIAGNOSIS — E7800 Pure hypercholesterolemia, unspecified: Secondary | ICD-10-CM | POA: Insufficient documentation

## 2016-07-31 DIAGNOSIS — M25559 Pain in unspecified hip: Secondary | ICD-10-CM

## 2016-07-31 DIAGNOSIS — M47816 Spondylosis without myelopathy or radiculopathy, lumbar region: Secondary | ICD-10-CM

## 2016-07-31 DIAGNOSIS — F411 Generalized anxiety disorder: Secondary | ICD-10-CM | POA: Insufficient documentation

## 2016-07-31 DIAGNOSIS — G8929 Other chronic pain: Secondary | ICD-10-CM

## 2016-07-31 DIAGNOSIS — M792 Neuralgia and neuritis, unspecified: Secondary | ICD-10-CM

## 2016-07-31 DIAGNOSIS — E876 Hypokalemia: Secondary | ICD-10-CM | POA: Insufficient documentation

## 2016-07-31 DIAGNOSIS — I2510 Atherosclerotic heart disease of native coronary artery without angina pectoris: Secondary | ICD-10-CM | POA: Insufficient documentation

## 2016-07-31 MED ORDER — TIZANIDINE HCL 4 MG PO TABS: 4.0000 mg | ORAL_TABLET | Freq: Four times a day (QID) | ORAL | 0 refills | Status: DC | PRN

## 2016-07-31 MED ORDER — GABAPENTIN 300 MG PO CAPS: 600.0000 mg | ORAL_CAPSULE | Freq: Three times a day (TID) | ORAL | 2 refills | Status: DC

## 2016-07-31 NOTE — Progress Notes (Signed)
Safety precautions to be maintained throughout the outpatient stay will include: orient to surroundings, keep bed in low position, maintain call bell within reach at all times, provide assistance with transfer out of bed and ambulation.  

## 2016-07-31 NOTE — Progress Notes (Signed)
Patient's Name: Sara Munoz  MRN: [id number]  Referring Provider: [facility]  DOB: May 10, 1966  PCP: [place]  DOS: 07/31/2016  Note by: Kathlen Brunswick. Dossie Arbour, MD  Service setting: Ambulatory outpatient  Specialty: Interventional Pain Management  Location: [facility] (AMB) Pain Management Facility    Patient type: Established   Primary Reason(s) for Visit: Encounter for post-procedure evaluation of chronic illness with mild to moderate exacerbation CC: Back Pain (lower)  HPI  Sara Munoz is a 51 y.o. year old, female patient, who comes today for a post-procedure evaluation. She has Non-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/2014; Hyperlipidemia; Costochondritis; COPD (chronic obstructive pulmonary disease) ([place]); GERD (gastroesophageal reflux disease); Hypertension; Anemia of chronic disease; Carotid artery stenosis; Chronic constipation; Chronic cough; Chronic nausea; Cigarette nicotine dependence with nicotine-induced disorder; Coronary artery disease; Drug abuse, IV; GAD (generalized anxiety disorder); Hypercholesterolemia; Hypomagnesemia; Narcotic abuse, continuous; Narcotic dependence ([place]); Osteoarthritis; Polysubstance abuse; PTSD (post-traumatic stress disorder); PVD (peripheral vascular disease) ([place]); Sleep apnea, obstructive; Smoker; TIA (transient ischemic attack); Long term current use of opiate analgesic; Long term prescription opiate use; Opiate use; Encounter for therapeutic drug level monitoring; Encounter for pain management consult; Chronic hip pain (Location of Tertiary source of pain) (Bilateral) (L>R); Chronic knee pain (Bilateral) (L>R); Chronic shoulder pain (Bilateral) (L>R); Chronic sacroiliac joint pain (Bilateral) (L>R); Chronic low back pain (Location of Primary Source of Pain) (Bilateral) (L>R); Chronic lower extremity pain (Location of Secondary source of pain) (Bilateral) (L>R); 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 Her primarily concern today is the Back Pain (lower)  Pain Assessment: Self-Reported Pain Score: 4 /10             Reported level is compatible with observation.       Pain Type: Chronic pain Pain Location: Back Pain Orientation: Lower Pain Descriptors / Indicators: Shooting, Aching Pain Frequency: Constant  Sara Munoz comes in today for post-procedure evaluation after the treatment done on 06/26/2016. Today I will take over her gabapentin and the tie tizanidine in an effort to see if we can provide her with better relief of the pain. Today I also spent a great amount of time explaining to her over looking for with the diagnostic injections and how it was imperative that the information that she reports back is accurate. Based on what I have seen so far, I think that this may still be a problem. Once again the patient asked me for controlled substances but I told her that I would not be doing that since we ended up having 3 different urine drug screening tests that they were all noncompliant. The first one had marijuana, the second one had hydrocodone and the third one had codeine, none of which were prescribed by me. Because of this, I cannot continue writing her for these type medications. Today I review her case and her pain continues to be primarily in the lower back with the right side being worse than the left. She also has bilateral lower extremity pain with the left being worse than the right when he comes to the left lower extremity the pain goes down to the knee through the lateral aspect while on the  right  side and goes all the way down to the top of her foot where she has pain and numbness. The physical exam today was positive for bilateral lumbar facet pain on hyperextension and rotation and it was also positive for bilateral sacroiliac joint pain upon doing the Patrick's maneuver. The patient also presented with a positive left hip anal on figure 4 maneuver. Because in the past we had done either the lumbar facet injections or the sacroiliac joint injections, it is very likely that this is the reason why she has not attained the 100% relief that she was looking for. In view of this and today's physical exam as well as my review of her results up to this point, I believe that she would be a candidate for a diagnostic bilateral lumbar facet block + diagnostic bilateral sacroiliac joint block under fluoroscopic guidance and IV sedation. If I am correct, this combination should provide patient with 100% relief of the pain for the duration of the local anesthetics with or without long-term benefit. If she does not get any long-term benefit but she does get more than 50% relief with the local anesthetic, she may be a good candidate for radiofrequency ablation. All of this was explained and communicated to the patient today.  Further details on both, my assessment(s), as well as the proposed treatment plan, please see below.  Post-Procedure Assessment  06/26/2016 Procedure: Diagnostic bilateral sacroiliac joint block under fluoroscopic guidance and IV sedation Post-procedure pain score: 0/10 (100% relief) Influential Factors: BMI: 33.47 kg/m Intra-procedural challenges: None observed Assessment challenges: Results reported today are inconsistent with those reported on procedure day, immediately before discharge. Previously the patient had reported 100% relief of the pain, before leaving the facility Post-procedural side-effects, adverse reactions, or complications: None reported Reported issues:  None  Sedation: Sedation provided. When no sedatives are used, the analgesic levels obtained are directly associated to the effectiveness of the local anesthetics. However, when sedation is provided, the level of analgesia obtained during the initial 1 hour following the intervention, is believed to be the result of a combination of factors. These factors may include, but are not limited to: 1. The effectiveness of the local anesthetics used. 2. The effects of the analgesic(s) and/or anxiolytic(s) used. 3. The degree of discomfort experienced by the patient at the time of the procedure. 4. The patients ability and reliability in recalling and recording the events. 5. The presence and influence of possible secondary gains and/or psychosocial factors. Reported result: Relief experienced during the 1st hour after the procedure: 50 % (Ultra-Short Term Relief) Interpretative annotation: Partial relief from local anesthetics would suggest that the injected area is not 100% responsible for the patient's symptoms.          Effects of local anesthetic: The analgesic effects attained during this period are directly associated to the localized infiltration of local anesthetics and therefore cary significant diagnostic value as to the etiological location, or anatomical origin, of the pain. Expected duration of relief is directly dependent on the pharmacodynamics of the local anesthetic used. Long-acting (4-6 hours) anesthetics used.  Reported result: Relief during the next 4 to 6 hour after the procedure: 25 % (Short-Term Relief) Interpretative annotation: Partial relief would suggest incomplete involvement of injected area.          Long-term benefit: Defined as the period of time past the expected duration of local anesthetics. With the possible exception of prolonged sympathetic blockade from the local anesthetics, benefits during this period are typically attributed  to, or associated with, other factors such  as analgesic sensory neuropraxia, antiinflammatory effects, or beneficial biochemical changes provided by agents other than the local anesthetics Reported result: Extended relief following procedure: 0 % (bilateral hips felt better for a couple of days ) (Long-Term Relief) Interpretative annotation: Good relief. This could suggest inflammation to be a significant component in the etiology to the pain.          Current benefits: Defined as persistent relief that continues at this point in time.   Reported results: Treated area: 0 %       Interpretative annotation: Recurrance of symptoms. This would suggest persistent aggravating factors  Interpretation: Results would suggest a successful diagnostic intervention.           04/29/2016 Procedure: Diagnostic Bilateral Lumbar Facet Block under fluoroscopy and IV sedation. Gave her 40% relief for the first 1 hour.  Laboratory Chemistry  Inflammation Markers Lab Results  Component Value Date   ESRSEDRATE 7 03/01/2016   CRP 1.7 (H) 03/01/2016   Renal Function Lab Results  Component Value Date   BUN 8 06/12/2016   CREATININE 0.79 06/12/2016   GFRAA >60 06/12/2016   GFRNONAA >60 06/12/2016   Hepatic Function Lab Results  Component Value Date   AST 98 (H) 06/12/2016   ALT 114 (H) 06/12/2016   ALBUMIN 3.9 06/12/2016   Electrolytes Lab Results  Component Value Date   NA 137 06/12/2016   K 3.8 06/12/2016   CL 109 06/12/2016   CALCIUM 8.8 (L) 06/12/2016   MG 1.6 (L) 03/01/2016   Pain Modulating Vitamins Lab Results  Component Value Date   25OHVITD1 32 03/01/2016   25OHVITD2 <1.0 03/01/2016   25OHVITD3 32 03/01/2016   VITAMINB12 491 03/01/2016   Coagulation Parameters Lab Results  Component Value Date   PLT 242 06/12/2016   Cardiovascular Lab Results  Component Value Date   BNP 24.0 08/01/2015   HGB 13.8 06/12/2016   HCT 40.7 06/12/2016   Note: Lab results reviewed.  Recent Diagnostic Imaging Review  Dg C-arm 1-60  Min-no Report  Result Date: 06/26/2016 There is no Radiologist interpretation  for this exam.  Note: Imaging results reviewed.          Meds  The patient has a current medication list which includes the following prescription(s): albuterol, aspirin ec, atorvastatin, budesonide-formoterol, buspirone, dexlansoprazole, docusate sodium, gabapentin, linaclotide, lisinopril, magnesium oxide, meloxicam, metoprolol tartrate, nitroglycerin, ondansetron, potassium chloride, ranexa, and tizanidine.  Current Outpatient Prescriptions on File Prior to Visit  Medication Sig  . albuterol (PROVENTIL HFA;VENTOLIN HFA) 108 (90 Base) MCG/ACT inhaler Inhale 2 puffs into the lungs every 4 (four) hours as needed for wheezing or shortness of breath.  Marland Kitchen aspirin EC 81 MG tablet Take 81 mg by mouth daily.  Marland Kitchen atorvastatin (LIPITOR) 20 MG tablet Take 20 mg by mouth at bedtime.  . budesonide-formoterol (SYMBICORT) 160-4.5 MCG/ACT inhaler Inhale 2 puffs into the lungs 2 (two) times daily.  . busPIRone (BUSPAR) 7.5 MG tablet Take 7.5 mg by mouth 3 (three) times daily.   Marland Kitchen dexlansoprazole (DEXILANT) 60 MG capsule Take 60 mg by mouth daily.  Marland Kitchen docusate sodium (COLACE) 100 MG capsule Take 200 mg by mouth 2 (two) times daily as needed for mild constipation.  Marland Kitchen linaclotide (LINZESS) 290 MCG CAPS capsule Take 290 mcg by mouth daily before breakfast.  . lisinopril (PRINIVIL,ZESTRIL) 20 MG tablet Take 40 mg by mouth daily.   . Magnesium Oxide 500 MG CAPS Take 1 capsule (500 mg  total) by mouth 2 (two) times daily at 8 am and 10 pm.  . meloxicam (MOBIC) 15 MG tablet Take 1 tablet (15 mg total) by mouth daily.  . metoprolol tartrate (LOPRESSOR) 25 MG tablet Take 1 tablet (25 mg total) by mouth 2 (two) times daily.  . nitroGLYCERIN (NITROSTAT) 0.4 MG SL tablet Place 1 tablet (0.4 mg total) under the tongue every 5 (five) minutes x 3 doses as needed for chest pain.  Marland Kitchen ondansetron (ZOFRAN ODT) 4 MG disintegrating tablet Take 1 tablet (4  mg total) by mouth every 8 (eight) hours as needed for nausea or vomiting.  . potassium chloride (KLOR-CON) 20 MEQ packet Take 20 mEq by mouth 2 (two) times daily.   Marland Kitchen RANEXA 500 MG 12 hr tablet Take 1 tablet (500 mg total) by mouth 2 (two) times daily.   No current facility-administered medications on file prior to visit.    ROS  Constitutional: Denies any fever or chills Gastrointestinal: No reported hemesis, hematochezia, vomiting, or acute GI distress Musculoskeletal: Denies any acute onset joint swelling, redness, loss of ROM, or weakness Neurological: No reported episodes of acute onset apraxia, aphasia, dysarthria, agnosia, amnesia, paralysis, loss of coordination, or loss of consciousness  Allergies  Ms. Laird is allergic to levofloxacin; flexeril [cyclobenzaprine]; keflex [cephalexin]; ketorolac; toradol [ketorolac tromethamine]; tramadol; and zoloft [sertraline hcl].  [place]  Drug: Ms. Eppolito  reports that she uses drugs, including Marijuana. Alcohol:  reports that she drinks alcohol. Tobacco:  reports that she has been smoking Cigarettes.  She has been smoking about 0.50 packs per day. She has never used smokeless tobacco. Medical:  has a past medical history of Anxiety; Asthma; Atypical chest pain (08/02/2015); CAD (coronary artery disease); Chronic back pain; COPD (chronic obstructive pulmonary disease) ([place]); Coronary artery disease; GERD (gastroesophageal reflux disease); Hypercholesteremia; Hypertension; Hypokalemia; Hypomagnesemia (01/04/2014); Liver disease; MI (myocardial infarction); and Osteoarthritis. Family: family history includes Heart disease in her mother; Lung cancer in her mother; Ovarian cancer in her mother.  Past Surgical History:  Procedure Laterality Date  . CARDIAC CATHETERIZATION Left 02/22/2016   Procedure: Left Heart Cath and Coronary Angiography;  Surgeon: Yolonda Kida, MD;  Location: [facility];  Service: Cardiovascular;  Laterality: Left;   . hemorroids    . ORIF FEMUR FRACTURE    . ORIF TIBIA & FIBULA FRACTURES    . OVARY SURGERY     Constitutional Exam  General appearance: Well nourished, well developed, and well hydrated. In no apparent acute distress Vitals:   07/31/16 1335  BP: (!) 171/96  Pulse: 63  Resp: 20  Temp: 98.6 F (37 C)  SpO2: 100%  Weight: 195 lb (88.5 kg)  Height: 5\' 4"  (1.626 m)   BMI Assessment: Estimated body mass index is 33.47 kg/m as calculated from the following:   Height as of this encounter: 5\' 4"  (1.626 m).   Weight as of this encounter: 195 lb (88.5 kg).  BMI interpretation table: BMI level Category Range association with higher incidence of chronic pain  <18 kg/m2 Underweight   18.5-24.9 kg/m2 Ideal body weight   25-29.9 kg/m2 Overweight Increased incidence by 20%  30-34.9 kg/m2 Obese (Class I) Increased incidence by 68%  35-39.9 kg/m2 Severe obesity (Class II) Increased incidence by 136%  >40 kg/m2 Extreme obesity (Class III) Increased incidence by 254%   BMI Readings from Last 4 Encounters:  07/31/16 33.47 kg/m  06/26/16 32.61 kg/m  06/12/16 33.81 kg/m  06/10/16 33.81 kg/m   Wt Readings from  Last 4 Encounters:  07/31/16 195 lb (88.5 kg)  06/26/16 190 lb (86.2 kg)  06/12/16 197 lb (89.4 kg)  06/10/16 197 lb (89.4 kg)  Psych/Mental status: Alert, oriented x 3 (person, place, & time)       Eyes: PERLA Respiratory: No evidence of acute respiratory distress  Cervical Spine Exam  Inspection: No masses, redness, or swelling Alignment: Symmetrical Functional ROM: Unrestricted ROM Stability: No instability detected Muscle strength & Tone: Functionally intact Sensory: Unimpaired Palpation: Non-contributory  Upper Extremity (UE) Exam    Side: Right upper extremity  Side: Left upper extremity  Inspection: No masses, redness, swelling, or asymmetry. No contractures  Inspection: No masses, redness, swelling, or asymmetry. No contractures  Functional ROM: Unrestricted ROM           Functional ROM: Unrestricted ROM          Muscle strength & Tone: Functionally intact  Muscle strength & Tone: Functionally intact  Sensory: Unimpaired  Sensory: Unimpaired  Palpation: Euthermic  Palpation: Euthermic  Specialized Test(s): Deferred         Specialized Test(s): Deferred          Thoracic Spine Exam  Inspection: No masses, redness, or swelling Alignment: Symmetrical Functional ROM: Unrestricted ROM Stability: No instability detected Sensory: Unimpaired Muscle strength & Tone: Functionally intact Palpation: Non-contributory  Lumbar Spine Exam  Inspection: No masses, redness, or swelling Alignment: Symmetrical Functional ROM: Limited ROM Stability: No instability detected Muscle strength & Tone: Increased muscle tone over affected area Sensory: Movement-associated pain Palpation: Complains of area being tender to palpation Provocative Tests: Lumbar Hyperextension and rotation test: Positive bilaterally for facet joint pain. Patrick's Maneuver: Positive for bilateral S-I joint pain and for left hip joint pain.  Gait & Posture Assessment  Ambulation: Limited Gait: Antalgic Posture: Antalgic   Lower Extremity Exam    Side: Right lower extremity  Side: Left lower extremity  Inspection: No masses, redness, swelling, or asymmetry. No contractures  Inspection: No masses, redness, swelling, or asymmetry. No contractures  Functional ROM: Unrestricted ROM          Functional ROM: Unrestricted ROM          Muscle strength & Tone: Functionally intact  Muscle strength & Tone: Functionally intact  Sensory: Unimpaired  Sensory: Unimpaired  Palpation: No palpable anomalies  Palpation: No palpable anomalies   Assessment  Primary Diagnosis & Pertinent Problem List: The primary encounter diagnosis was Chronic sacroiliac joint pain (Bilateral) (L>R). Diagnoses of Lumbar facet syndrome (Bilateral) (L>R), Chronic low back pain (Location of Primary Source of Pain) (Bilateral)  (L>R), Chronic lower extremity pain (Location of Secondary source of pain) (Bilateral) (L>R), Chronic lumbar radicular pain (Right) (L5 dermatome), Chronic hip pain, unspecified laterality, and Neurogenic pain were also pertinent to this visit.  Status Diagnosis  Controlled Controlled Controlled 1. Chronic sacroiliac joint pain (Bilateral) (L>R)   2. Lumbar facet syndrome (Bilateral) (L>R)   3. Chronic low back pain (Location of Primary Source of Pain) (Bilateral) (L>R)   4. Chronic lower extremity pain (Location of Secondary source of pain) (Bilateral) (L>R)   5. Chronic lumbar radicular pain (Right) (L5 dermatome)   6. Chronic hip pain, unspecified laterality   7. Neurogenic pain      Plan of Care  Pharmacotherapy (Medications Ordered): Meds ordered this encounter  Medications  . gabapentin (NEURONTIN) 300 MG capsule    Sig: Take 2-3 capsules (600-900 mg total) by mouth 3 (three) times daily.    Dispense:  270 capsule  Refill:  2    Do not place medication on "Automatic Refill". Fill one day early if pharmacy is closed on scheduled refill date.  Marland Kitchen tiZANidine (ZANAFLEX) 4 MG tablet    Sig: Take 1-2 tablets (4-8 mg total) by mouth every 6 (six) hours as needed for muscle spasms.    Dispense:  240 tablet    Refill:  0    Do not place medication on "Automatic Refill". Fill one day early if pharmacy is closed on scheduled refill date.   New Prescriptions   No medications on file   Medications administered today: Ms. Uhl had no medications administered during this visit. Lab-work, procedure(s), and/or referral(s): Orders Placed This Encounter  Procedures  . LUMBAR FACET(MEDIAL BRANCH NERVE BLOCK) MBNB  . SACROILIAC JOINT INJECTINS   Imaging and/or referral(s): None  Interventional therapies: Planned, scheduled, and/or pending:   Diagnostic bilateral lumbar facet block + diagnostic bilateral sacroiliac joint block under fluoroscopic guidance and IV sedation.    Considering:   Diagnostic bilateral lumbar facet block + diagnostic bilateral sacroiliac joint block under fluoroscopic guidance and IV sedation. Possible bilateral lumbar facet and sacroiliac joint radiofrequency ablation. Diagnostic left intra-articular hip joint injection. Possible left femoral nerve and obturator nerve block.  Possible left femoral nerve and obturator nerve bradycardia frequency ablation.  Right-sided L5-S1 transforaminal epidural steroid injection. Left-sided L3-4 translaminar epidural steroid injection.  Midline caudal epidural steroid injection and diagnostic epidurogram. Diagnostic bilateral cervical facet block under fluoroscopic guidance and IV sedation.  Possible bilateral cervical facet radiofrequency ablation under fluoroscopic guidance and IV sedation.  Diagnostic left-sided cervical epidural steroid injection under fluoroscopic guidance, with or without sedation.  Diagnostic bilateral intra-articular shoulder joint injection under fluoroscopic guidance, with or without sedation.  Diagnostic bilateral suprascapular nerve block under fluoroscopic guidance and IV sedation.  Possible bilateral suprascapular nerve radiofrequency ablation under fluoroscopic guidance and IV sedation.  Diagnostic bilateral intra-articular knee joint injection with local anesthetic and steroids without fluoroscopy or IV sedation.  Possible series of 5 intra-articular Hyalgan knee injections without fluoroscopy or IV sedation.  Diagnostic bilateral genicular nerve blocks under fluoroscopic guidance and IV sedation.  Possible bilateral genicular nerve radiofrequency ablation under fluoroscopic guidance and IV sedation.  Diagnostic bilateral greater occipital nerve blocks under fluoroscopic guidance, with or without sedation.  Possible bilateral greater occipital nerve radiofrequency ablation under fluoroscopic guidance and IV sedation.    Palliative PRN treatment(s):   None at this  time.    Provider-requested follow-up: Return for procedure (ASAP).  Future Appointments Date Time Provider [place]  08/05/2016 8:00 AM Milinda Pointer, MD [facility] None  08/07/2016 2:30 PM [name], MD [facility] None   Primary Care Physician: [facility] Location: [facility] Note by: Kathlen Brunswick. Dossie Arbour, M.D, DABA, DABAPM, DABPM, DABIPP, FIPP Date: 07/31/2016; Time: 3:09 PM  Pain Score Disclaimer: We use the NRS-11 scale. This is a self-reported, subjective measurement of pain severity with only modest accuracy. It is used primarily to identify changes within a particular patient. It must be understood that outpatient pain scales are significantly less accurate that those used for research, where they can be applied under ideal controlled circumstances with minimal exposure to variables. In reality, the score is likely to be a combination of pain intensity and pain affect, where pain affect describes the degree of emotional arousal or changes in action readiness caused by the sensory experience of pain. Factors such as social and work situation, setting, emotional state, anxiety levels, expectation,  and prior pain experience may influence pain perception and show large inter-individual differences that may also be affected by time variables.  Patient instructions provided during this appointment: Patient Instructions   You have 2 prescriptions at the pharmacy for pick up.  Pre procedure instructions given.  Do not eat or drink for 8 hours prior to procedure, Bring a driver.   Take blood pressure medication the morning of the procedure.    Steps to Quit Smoking Smoking tobacco can be harmful to your health and can affect almost every organ in your body. Smoking puts you, and those around you, at risk for developing many serious chronic diseases. Quitting smoking is difficult, but it is one of the best things that you can do for your  health. It is never too late to quit. What are the benefits of quitting smoking? When you quit smoking, you lower your risk of developing serious diseases and conditions, such as:  Lung cancer or lung disease, such as COPD.  Heart disease.  Stroke.  Heart attack.  Infertility.  Osteoporosis and bone fractures. Additionally, symptoms such as coughing, wheezing, and shortness of breath may get better when you quit. You may also find that you get sick less often because your body is stronger at fighting off colds and infections. If you are pregnant, quitting smoking can help to reduce your chances of having a baby of low birth weight. How do I get ready to quit? When you decide to quit smoking, create a plan to make sure that you are successful. Before you quit:  Pick a date to quit. Set a date within the next two weeks to give you time to prepare.  Write down the reasons why you are quitting. Keep this list in places where you will see it often, such as on your bathroom mirror or in your car or wallet.  Identify the people, places, things, and activities that make you want to smoke (triggers) and avoid them. Make sure to take these actions:  Throw away all cigarettes at home, at work, and in your car.  Throw away smoking accessories, such as Scientist, research (medical).  Clean your car and make sure to empty the ashtray.  Clean your home, including curtains and carpets.  Tell your family, friends, and coworkers that you are quitting. Support from your loved ones can make quitting easier.  Talk with your health care provider about your options for quitting smoking.  Find out what treatment options are covered by your health insurance. What strategies can I use to quit smoking? Talk with your healthcare provider about different strategies to quit smoking. Some strategies include:  Quitting smoking altogether instead of gradually lessening how much you smoke over a period of time.  Research shows that quitting "cold [place]" is more successful than gradually quitting.  Attending in-person counseling to help you build problem-solving skills. You are more likely to have success in quitting if you attend several counseling sessions. Even short sessions of 10 minutes can be effective.  Finding resources and support systems that can help you to quit smoking and remain smoke-free after you quit. These resources are most helpful when you use them often. They can include:  Online chats with a Social worker.  Telephone quitlines.  Printed Furniture conservator/restorer.  Support groups or group counseling.  Text messaging programs.  Mobile phone applications.  Taking medicines to help you quit smoking. (If you are pregnant or breastfeeding, talk with your health care provider first.) Some medicines  contain nicotine and some do not. Both types of medicines help with cravings, but the medicines that include nicotine help to relieve withdrawal symptoms. Your health care provider may recommend:  Nicotine patches, gum, or lozenges.  Nicotine inhalers or sprays.  Non-nicotine medicine that is taken by mouth. Talk with your health care provider about combining strategies, such as taking medicines while you are also receiving in-person counseling. Using these two strategies together makes you more likely to succeed in quitting than if you used either strategy on its own. If you are pregnant or breastfeeding, talk with your health care provider about finding counseling or other support strategies to quit smoking. Do not take medicine to help you quit smoking unless told to do so by your health care provider. What things can I do to make it easier to quit? Quitting smoking might feel overwhelming at first, but there is a lot that you can do to make it easier. Take these important actions:  Reach out to your family and friends and ask that they support and encourage you during this time. Call  telephone quitlines, reach out to support groups, or work with a counselor for support.  Ask people who smoke to avoid smoking around you.  Avoid places that trigger you to smoke, such as bars, parties, or smoke-break areas at work.  Spend time around people who do not smoke.  Lessen stress in your life, because stress can be a smoking trigger for some people. To lessen stress, try:  Exercising regularly.  Deep-breathing exercises.  Yoga.  Meditating.  Performing a body scan. This involves closing your eyes, scanning your body from head to toe, and noticing which parts of your body are particularly tense. Purposefully relax the muscles in those areas.  Download or purchase mobile phone or tablet apps (applications) that can help you stick to your quit plan by providing reminders, tips, and encouragement. There are many free apps, such as QuitGuide from the [employer] Office manager for Disease Control and Prevention). You can find other support for quitting smoking (smoking cessation) through smokefree.gov and other websites. How will I feel when I quit smoking? Within the first 24 hours of quitting smoking, you may start to feel some withdrawal symptoms. These symptoms are usually most noticeable 2-3 days after quitting, but they usually do not last beyond 2-3 weeks. Changes or symptoms that you might experience include:  Mood swings.  Restlessness, anxiety, or irritation.  Difficulty concentrating.  Dizziness.  Strong cravings for sugary foods in addition to nicotine.  Mild weight gain.  Constipation.  Nausea.  Coughing or a sore throat.  Changes in how your medicines work in your body.  A depressed mood.  Difficulty sleeping (insomnia). After the first 2-3 weeks of quitting, you may start to notice more positive results, such as:  Improved sense of smell and taste.  Decreased coughing and sore throat.  Slower heart rate.  Lower blood pressure.  Clearer skin.  The  ability to breathe more easily.  Fewer sick days. Quitting smoking is very challenging for most people. Do not get discouraged if you are not successful the first time. Some people need to make many attempts to quit before they achieve long-term success. Do your best to stick to your quit plan, and talk with your health care provider if you have any questions or concerns. This information is not intended to replace advice given to you by your health care provider. Make sure you discuss any questions you have with  your health care provider. Document Released: 06/04/2001 Document Revised: 02/06/2016 Document Reviewed: 10/25/2014 Elsevier Interactive Patient Education  2017 [employer]  What are the risk, side effects and possible complications? Generally speaking, most procedures are safe.  However, with any procedure there are risks, side effects, and the possibility of complications.  The risks and complications are dependent upon the sites that are lesioned, or the type of nerve block to be performed.  The closer the procedure is to the spine, the more serious the risks are.  Great care is taken when placing the radio frequency needles, block needles or lesioning probes, but sometimes complications can occur. 1. Infection: Any time there is an injection through the skin, there is a risk of infection.  This is why sterile conditions are used for these blocks.  There are four possible types of infection. 1. Localized skin infection. 2. Central Nervous System Infection-This can be in the form of Meningitis, which can be deadly. 3. Epidural Infections-This can be in the form of an epidural abscess, which can cause pressure inside of the spine, causing compression of the spinal cord with subsequent paralysis. This would require an emergency surgery to decompress, and there are no guarantees that the patient would recover from the paralysis. 4. Discitis-This is an  infection of the intervertebral discs.  It occurs in about 1% of discography procedures.  It is difficult to treat and it may lead to surgery.        2. Pain: the needles have to go through skin and soft tissues, will cause soreness.       3. Damage to internal structures:  The nerves to be lesioned may be near blood vessels or    other nerves which can be potentially damaged.       4. Bleeding: Bleeding is more common if the patient is taking blood thinners such as  aspirin, Coumadin, Ticiid, Plavix, etc., or if he/she have some genetic predisposition  such as hemophilia. Bleeding into the spinal canal can cause compression of the spinal  cord with subsequent paralysis.  This would require an emergency surgery to  decompress and there are no guarantees that the patient would recover from the  paralysis.       5. Pneumothorax:  Puncturing of a lung is a possibility, every time a needle is introduced in  the area of the chest or upper back.  Pneumothorax refers to free air around the  collapsed lung(s), inside of the thoracic cavity (chest cavity).  Another two possible  complications related to a similar event would include: Hemothorax and Chylothorax.   These are variations of the Pneumothorax, where instead of air around the collapsed  lung(s), you may have blood or chyle, respectively.       6. Spinal headaches: They may occur with any procedures in the area of the spine.       7. Persistent CSF (Cerebro-Spinal Fluid) leakage: This is a rare problem, but may occur  with prolonged intrathecal or epidural catheters either due to the formation of a fistulous  track or a dural tear.       8. Nerve damage: By working so close to the spinal cord, there is always a possibility of  nerve damage, which could be as serious as a permanent spinal cord injury with  paralysis.       9. Death:  Although rare, severe deadly allergic reactions known as "Anaphylactic  reaction" can occur to any  of the medications used.       10. Worsening of the symptoms:  We can always make thing worse.  What are the chances of something like this happening? Chances of any of this occuring are extremely low.  By statistics, you have more of a chance of getting killed in a motor vehicle accident: while driving to the hospital than any of the above occurring .  Nevertheless, you should be aware that they are possibilities.  In general, it is similar to taking a shower.  Everybody knows that you can slip, hit your head and get killed.  Does that mean that you should not shower again?  Nevertheless always keep in mind that statistics do not mean anything if you happen to be on the wrong side of them.  Even if a procedure has a 1 (one) in a 1,000,000 (million) chance of going wrong, it you happen to be that one..Also, keep in mind that by statistics, you have more of a chance of having something go wrong when taking medications.  Who should not have this procedure? If you are on a blood thinning medication (e.g. Coumadin, Plavix, see list of "Blood Thinners"), or if you have an active infection going on, you should not have the procedure.  If you are taking any blood thinners, please inform your physician.  How should I prepare for this procedure?  Do not eat or drink anything at least six hours prior to the procedure.  Bring a driver with you .  It cannot be a taxi.  Come accompanied by an adult that can drive you back, and that is strong enough to help you if your legs get weak or numb from the local anesthetic.  Take all of your medicines the morning of the procedure with just enough water to swallow them.  If you have diabetes, make sure that you are scheduled to have your procedure done first thing in the morning, whenever possible.  If you have diabetes, take only half of your insulin dose and notify our nurse that you have done so as soon as you arrive at the clinic.  If you are diabetic, but only take blood sugar pills  (oral hypoglycemic), then do not take them on the morning of your procedure.  You may take them after you have had the procedure.  Do not take aspirin or any aspirin-containing medications, at least eleven (11) days prior to the procedure.  They may prolong bleeding.  Wear loose fitting clothing that may be easy to take off and that you would not mind if it got stained with Betadine or blood.  Do not wear any jewelry or perfume  Remove any nail coloring.  It will interfere with some of our monitoring equipment.  NOTE: Remember that this is not meant to be interpreted as a complete list of all possible complications.  Unforeseen problems may occur.  BLOOD THINNERS The following drugs contain aspirin or other products, which can cause increased bleeding during surgery and should not be taken for 2 weeks prior to and 1 week after surgery.  If you should need take something for relief of minor pain, you may take acetaminophen which is found in Tylenol,m Datril, Anacin-3 and Panadol. It is not blood thinner. The products listed below are.  Do not take any of the products listed below in addition to any listed on your instruction sheet.  A.P.C or A.P.C with Codeine Codeine Phosphate Capsules #3 Ibuprofen Ridaura  ABC compound  Congesprin Imuran rimadil  Advil Cope Indocin Robaxisal  Alka-Seltzer Effervescent Pain Reliever and Antacid Coricidin or Coricidin-D  Indomethacin Rufen  Alka-Seltzer plus Cold Medicine Cosprin Ketoprofen S-A-C Tablets  Anacin Analgesic Tablets or Capsules Coumadin Korlgesic Salflex  Anacin Extra Strength Analgesic tablets or capsules CP-2 Tablets Lanoril Salicylate  Anaprox Cuprimine Capsules Levenox Salocol  Anexsia-D Dalteparin Magan Salsalate  Anodynos Darvon compound Magnesium Salicylate Sine-off  Ansaid Dasin Capsules Magsal Sodium Salicylate  Anturane Depen Capsules Marnal Soma  APF Arthritis pain formula Dewitt's Pills Measurin Stanback  Argesic Dia-Gesic  Meclofenamic Sulfinpyrazone  Arthritis Bayer Timed Release Aspirin Diclofenac Meclomen Sulindac  Arthritis pain formula Anacin Dicumarol Medipren Supac  Analgesic (Safety coated) Arthralgen Diffunasal Mefanamic Suprofen  Arthritis Strength Bufferin Dihydrocodeine Mepro Compound Suprol  Arthropan liquid Dopirydamole Methcarbomol with Aspirin Synalgos  ASA tablets/Enseals Disalcid Micrainin Tagament  Ascriptin Doan's Midol Talwin  Ascriptin A/D Dolene Mobidin Tanderil  Ascriptin Extra Strength Dolobid Moblgesic Ticlid  Ascriptin with Codeine Doloprin or Doloprin with Codeine Momentum Tolectin  Asperbuf Duoprin Mono-gesic Trendar  Aspergum Duradyne Motrin or Motrin IB Triminicin  Aspirin plain, buffered or enteric coated Durasal Myochrisine Trigesic  Aspirin Suppositories Easprin Nalfon Trillsate  Aspirin with Codeine Ecotrin Regular or Extra Strength Naprosyn Uracel  Atromid-S Efficin Naproxen Ursinus  Auranofin Capsules Elmiron Neocylate Vanquish  Axotal Emagrin Norgesic Verin  Azathioprine Empirin or Empirin with Codeine Normiflo Vitamin E  Azolid Emprazil Nuprin Voltaren  Bayer Aspirin plain, buffered or children's or timed BC Tablets or powders Encaprin Orgaran Warfarin Sodium  Buff-a-Comp Enoxaparin Orudis Zorpin  Buff-a-Comp with Codeine Equegesic Os-Cal-Gesic   Buffaprin Excedrin plain, buffered or Extra Strength Oxalid   Bufferin Arthritis Strength Feldene Oxphenbutazone   Bufferin plain or Extra Strength Feldene Capsules Oxycodone with Aspirin   Bufferin with Codeine Fenoprofen Fenoprofen Pabalate or Pabalate-SF   Buffets II Flogesic Panagesic   Buffinol plain or Extra Strength Florinal or Florinal with Codeine Panwarfarin   Buf-Tabs Flurbiprofen Penicillamine   Butalbital Compound Four-way cold tablets Penicillin   Butazolidin Fragmin Pepto-Bismol   Carbenicillin Geminisyn Percodan   Carna Arthritis Reliever Geopen Persantine   Carprofen Gold's salt Persistin    Chloramphenicol Goody's Phenylbutazone   Chloromycetin Haltrain Piroxlcam   Clmetidine heparin Plaquenil   Cllnoril Hyco-pap Ponstel   Clofibrate Hydroxy chloroquine Propoxyphen         Before stopping any of these medications, be sure to consult the physician who ordered them.  Some, such as Coumadin (Warfarin) are ordered to prevent or treat serious conditions such as "deep thrombosis", "pumonary embolisms", and other heart problems.  The amount of time that you may need off of the medication may also vary with the medication and the reason for which you were taking it.  If you are taking any of these medications, please make sure you notify your pain physician before you undergo any procedures.         Sacroiliac (SI) Joint Injection Patient Information  Description: The sacroiliac joint connects the scrum (very low back and tailbone) to the ilium (a pelvic bone which also forms half of the hip joint).  Normally this joint experiences very little motion.  When this joint becomes inflamed or unstable low back and or hip and pelvis pain may result.  Injection of this joint with local anesthetics (numbing medicines) and steroids can provide diagnostic information and reduce pain.  This injection is performed with the aid of x-ray guidance into the tailbone area while you are lying on your stomach.  You may experience an electrical sensation down the leg while this is being done.  You may also experience numbness.  We also may ask if we are reproducing your normal pain during the injection.  Conditions which may be treated SI injection:   Low back, buttock, hip or leg pain  Preparation for the Injection:  1. Do not eat any solid food or dairy products within 8 hours of your appointment.  2. You may drink clear liquids up to 3 hours before appointment.  Clear liquids include water, black coffee, juice or soda.  No milk or cream please. 3. You may take your regular medications,  including pain medications with a sip of water before your appointment.  Diabetics should hold regular insulin (if take separately) and take 1/2 normal NPH dose the morning of the procedure.  Carry some sugar containing items with you to your appointment. 4. A driver must accompany you and be prepared to drive you home after your procedure. 5. Bring all of your current medications with you. 6. An IV may be inserted and sedation may be given at the discretion of the physician. 7. A blood pressure cuff, EKG and other monitors will often be applied during the procedure.  Some patients may need to have extra oxygen administered for a short period.  8. You will be asked to provide medical information, including your allergies, prior to the procedure.  We must know immediately if you are taking blood thinners (like Coumadin/Warfarin) or if you are allergic to IV iodine contrast (dye).  We must know if you could possible be pregnant.  Possible side effects:   Bleeding from needle site  Infection (rare, may require surgery)  Nerve injury (rare)  Numbness & tingling (temporary)  A brief convulsion or seizure  Light-headedness (temporary)  Pain at injection site (several days)  Decreased blood pressure (temporary)  Weakness in the leg (temporary)   Call if you experience:   New onset weakness or numbness of an extremity below the injection site that last more than 8 hours.  Hives or difficulty breathing ( go to the emergency room)  Inflammation or drainage at the injection site  Any new symptoms which are concerning to you  Please note:  Although the local anesthetic injected can often make your back/ hip/ buttock/ leg feel good for several hours after the injections, the pain will likely return.  It takes 3-7 days for steroids to work in the sacroiliac area.  You may not notice any pain relief for at least that one week.  If effective, we will often do a series of three injections  spaced 3-6 weeks apart to maximally decrease your pain.  After the initial series, we generally will wait some months before a repeat injection of the same type.  If you have any questions, please call [phone] [facility]

## 2016-07-31 NOTE — Patient Instructions (Addendum)
You have 2 prescriptions at the pharmacy for pick up.  Pre procedure instructions given.  Do not eat or drink for 8 hours prior to procedure, Bring a driver.   Take blood pressure medication the morning of the procedure.    Steps to Quit Smoking Smoking tobacco can be harmful to your health and can affect almost every organ in your body. Smoking puts you, and those around you, at risk for developing many serious chronic diseases. Quitting smoking is difficult, but it is one of the best things that you can do for your health. It is never too late to quit. What are the benefits of quitting smoking? When you quit smoking, you lower your risk of developing serious diseases and conditions, such as:  Lung cancer or lung disease, such as COPD.  Heart disease.  Stroke.  Heart attack.  Infertility.  Osteoporosis and bone fractures. Additionally, symptoms such as coughing, wheezing, and shortness of breath may get better when you quit. You may also find that you get sick less often because your body is stronger at fighting off colds and infections. If you are pregnant, quitting smoking can help to reduce your chances of having a baby of low birth weight. How do I get ready to quit? When you decide to quit smoking, create a plan to make sure that you are successful. Before you quit:  Pick a date to quit. Set a date within the next two weeks to give you time to prepare.  Write down the reasons why you are quitting. Keep this list in places where you will see it often, such as on your bathroom mirror or in your car or wallet.  Identify the people, places, things, and activities that make you want to smoke (triggers) and avoid them. Make sure to take these actions:  Throw away all cigarettes at home, at work, and in your car.  Throw away smoking accessories, such as Scientist, research (medical).  Clean your car and make sure to empty the ashtray.  Clean your home, including curtains and  carpets.  Tell your family, friends, and coworkers that you are quitting. Support from your loved ones can make quitting easier.  Talk with your health care provider about your options for quitting smoking.  Find out what treatment options are covered by your health insurance. What strategies can I use to quit smoking? Talk with your healthcare provider about different strategies to quit smoking. Some strategies include:  Quitting smoking altogether instead of gradually lessening how much you smoke over a period of time. Research shows that quitting "cold [place]" is more successful than gradually quitting.  Attending in-person counseling to help you build problem-solving skills. You are more likely to have success in quitting if you attend several counseling sessions. Even short sessions of 10 minutes can be effective.  Finding resources and support systems that can help you to quit smoking and remain smoke-free after you quit. These resources are most helpful when you use them often. They can include:  Online chats with a Social worker.  Telephone quitlines.  Printed Furniture conservator/restorer.  Support groups or group counseling.  Text messaging programs.  Mobile phone applications.  Taking medicines to help you quit smoking. (If you are pregnant or breastfeeding, talk with your health care provider first.) Some medicines contain nicotine and some do not. Both types of medicines help with cravings, but the medicines that include nicotine help to relieve withdrawal symptoms. Your health care provider may recommend:  Nicotine patches,  gum, or lozenges.  Nicotine inhalers or sprays.  Non-nicotine medicine that is taken by mouth. Talk with your health care provider about combining strategies, such as taking medicines while you are also receiving in-person counseling. Using these two strategies together makes you more likely to succeed in quitting than if you used either strategy on its own. If  you are pregnant or breastfeeding, talk with your health care provider about finding counseling or other support strategies to quit smoking. Do not take medicine to help you quit smoking unless told to do so by your health care provider. What things can I do to make it easier to quit? Quitting smoking might feel overwhelming at first, but there is a lot that you can do to make it easier. Take these important actions:  Reach out to your family and friends and ask that they support and encourage you during this time. Call telephone quitlines, reach out to support groups, or work with a counselor for support.  Ask people who smoke to avoid smoking around you.  Avoid places that trigger you to smoke, such as bars, parties, or smoke-break areas at work.  Spend time around people who do not smoke.  Lessen stress in your life, because stress can be a smoking trigger for some people. To lessen stress, try:  Exercising regularly.  Deep-breathing exercises.  Yoga.  Meditating.  Performing a body scan. This involves closing your eyes, scanning your body from head to toe, and noticing which parts of your body are particularly tense. Purposefully relax the muscles in those areas.  Download or purchase mobile phone or tablet apps (applications) that can help you stick to your quit plan by providing reminders, tips, and encouragement. There are many free apps, such as QuitGuide from the [employer] Office manager for Disease Control and Prevention). You can find other support for quitting smoking (smoking cessation) through smokefree.gov and other websites. How will I feel when I quit smoking? Within the first 24 hours of quitting smoking, you may start to feel some withdrawal symptoms. These symptoms are usually most noticeable 2-3 days after quitting, but they usually do not last beyond 2-3 weeks. Changes or symptoms that you might experience include:  Mood swings.  Restlessness, anxiety, or  irritation.  Difficulty concentrating.  Dizziness.  Strong cravings for sugary foods in addition to nicotine.  Mild weight gain.  Constipation.  Nausea.  Coughing or a sore throat.  Changes in how your medicines work in your body.  A depressed mood.  Difficulty sleeping (insomnia). After the first 2-3 weeks of quitting, you may start to notice more positive results, such as:  Improved sense of smell and taste.  Decreased coughing and sore throat.  Slower heart rate.  Lower blood pressure.  Clearer skin.  The ability to breathe more easily.  Fewer sick days. Quitting smoking is very challenging for most people. Do not get discouraged if you are not successful the first time. Some people need to make many attempts to quit before they achieve long-term success. Do your best to stick to your quit plan, and talk with your health care provider if you have any questions or concerns. This information is not intended to replace advice given to you by your health care provider. Make sure you discuss any questions you have with your health care provider. Document Released: 06/04/2001 Document Revised: 02/06/2016 Document Reviewed: 10/25/2014 Elsevier Interactive Patient Education  2017 [place]  What are the risk, side effects and  possible complications? Generally speaking, most procedures are safe.  However, with any procedure there are risks, side effects, and the possibility of complications.  The risks and complications are dependent upon the sites that are lesioned, or the type of nerve block to be performed.  The closer the procedure is to the spine, the more serious the risks are.  Great care is taken when placing the radio frequency needles, block needles or lesioning probes, but sometimes complications can occur. 1. Infection: Any time there is an injection through the skin, there is a risk of infection.  This is why sterile conditions  are used for these blocks.  There are four possible types of infection. 1. Localized skin infection. 2. Central Nervous System Infection-This can be in the form of Meningitis, which can be deadly. 3. Epidural Infections-This can be in the form of an epidural abscess, which can cause pressure inside of the spine, causing compression of the spinal cord with subsequent paralysis. This would require an emergency surgery to decompress, and there are no guarantees that the patient would recover from the paralysis. 4. Discitis-This is an infection of the intervertebral discs.  It occurs in about 1% of discography procedures.  It is difficult to treat and it may lead to surgery.        2. Pain: the needles have to go through skin and soft tissues, will cause soreness.       3. Damage to internal structures:  The nerves to be lesioned may be near blood vessels or    other nerves which can be potentially damaged.       4. Bleeding: Bleeding is more common if the patient is taking blood thinners such as  aspirin, Coumadin, Ticiid, Plavix, etc., or if he/she have some genetic predisposition  such as hemophilia. Bleeding into the spinal canal can cause compression of the spinal  cord with subsequent paralysis.  This would require an emergency surgery to  decompress and there are no guarantees that the patient would recover from the  paralysis.       5. Pneumothorax:  Puncturing of a lung is a possibility, every time a needle is introduced in  the area of the chest or upper back.  Pneumothorax refers to free air around the  collapsed lung(s), inside of the thoracic cavity (chest cavity).  Another two possible  complications related to a similar event would include: Hemothorax and Chylothorax.   These are variations of the Pneumothorax, where instead of air around the collapsed  lung(s), you may have blood or chyle, respectively.       6. Spinal headaches: They may occur with any procedures in the area of the spine.        7. Persistent CSF (Cerebro-Spinal Fluid) leakage: This is a rare problem, but may occur  with prolonged intrathecal or epidural catheters either due to the formation of a fistulous  track or a dural tear.       8. Nerve damage: By working so close to the spinal cord, there is always a possibility of  nerve damage, which could be as serious as a permanent spinal cord injury with  paralysis.       9. Death:  Although rare, severe deadly allergic reactions known as "Anaphylactic  reaction" can occur to any of the medications used.      10. Worsening of the symptoms:  We can always make thing worse.  What are the chances of something like this happening? Chances  of any of this occuring are extremely low.  By statistics, you have more of a chance of getting killed in a motor vehicle accident: while driving to the hospital than any of the above occurring .  Nevertheless, you should be aware that they are possibilities.  In general, it is similar to taking a shower.  Everybody knows that you can slip, hit your head and get killed.  Does that mean that you should not shower again?  Nevertheless always keep in mind that statistics do not mean anything if you happen to be on the wrong side of them.  Even if a procedure has a 1 (one) in a 1,000,000 (million) chance of going wrong, it you happen to be that one..Also, keep in mind that by statistics, you have more of a chance of having something go wrong when taking medications.  Who should not have this procedure? If you are on a blood thinning medication (e.g. Coumadin, Plavix, see list of "Blood Thinners"), or if you have an active infection going on, you should not have the procedure.  If you are taking any blood thinners, please inform your physician.  How should I prepare for this procedure?  Do not eat or drink anything at least six hours prior to the procedure.  Bring a driver with you .  It cannot be a taxi.  Come accompanied by an adult that can  drive you back, and that is strong enough to help you if your legs get weak or numb from the local anesthetic.  Take all of your medicines the morning of the procedure with just enough water to swallow them.  If you have diabetes, make sure that you are scheduled to have your procedure done first thing in the morning, whenever possible.  If you have diabetes, take only half of your insulin dose and notify our nurse that you have done so as soon as you arrive at the clinic.  If you are diabetic, but only take blood sugar pills (oral hypoglycemic), then do not take them on the morning of your procedure.  You may take them after you have had the procedure.  Do not take aspirin or any aspirin-containing medications, at least eleven (11) days prior to the procedure.  They may prolong bleeding.  Wear loose fitting clothing that may be easy to take off and that you would not mind if it got stained with Betadine or blood.  Do not wear any jewelry or perfume  Remove any nail coloring.  It will interfere with some of our monitoring equipment.  NOTE: Remember that this is not meant to be interpreted as a complete list of all possible complications.  Unforeseen problems may occur.  BLOOD THINNERS The following drugs contain aspirin or other products, which can cause increased bleeding during surgery and should not be taken for 2 weeks prior to and 1 week after surgery.  If you should need take something for relief of minor pain, you may take acetaminophen which is found in Tylenol,m Datril, Anacin-3 and Panadol. It is not blood thinner. The products listed below are.  Do not take any of the products listed below in addition to any listed on your instruction sheet.  A.P.C or A.P.C with Codeine Codeine Phosphate Capsules #3 Ibuprofen Ridaura  ABC compound Congesprin Imuran rimadil  Advil Cope Indocin Robaxisal  Alka-Seltzer Effervescent Pain Reliever and Antacid Coricidin or Coricidin-D  Indomethacin  Rufen  Alka-Seltzer plus Cold Medicine Cosprin Ketoprofen S-A-C Tablets  Anacin  Analgesic Tablets or Capsules Coumadin Korlgesic Salflex  Anacin Extra Strength Analgesic tablets or capsules CP-2 Tablets Lanoril Salicylate  Anaprox Cuprimine Capsules Levenox Salocol  Anexsia-D Dalteparin Magan Salsalate  Anodynos Darvon compound Magnesium Salicylate Sine-off  Ansaid Dasin Capsules Magsal Sodium Salicylate  Anturane Depen Capsules Marnal Soma  APF Arthritis pain formula Dewitt's Pills Measurin Stanback  Argesic Dia-Gesic Meclofenamic Sulfinpyrazone  Arthritis Bayer Timed Release Aspirin Diclofenac Meclomen Sulindac  Arthritis pain formula Anacin Dicumarol Medipren Supac  Analgesic (Safety coated) Arthralgen Diffunasal Mefanamic Suprofen  Arthritis Strength Bufferin Dihydrocodeine Mepro Compound Suprol  Arthropan liquid Dopirydamole Methcarbomol with Aspirin Synalgos  ASA tablets/Enseals Disalcid Micrainin Tagament  Ascriptin Doan's Midol Talwin  Ascriptin A/D Dolene Mobidin Tanderil  Ascriptin Extra Strength Dolobid Moblgesic Ticlid  Ascriptin with Codeine Doloprin or Doloprin with Codeine Momentum Tolectin  Asperbuf Duoprin Mono-gesic Trendar  Aspergum Duradyne Motrin or Motrin IB Triminicin  Aspirin plain, buffered or enteric coated Durasal Myochrisine Trigesic  Aspirin Suppositories Easprin Nalfon Trillsate  Aspirin with Codeine Ecotrin Regular or Extra Strength Naprosyn Uracel  Atromid-S Efficin Naproxen Ursinus  Auranofin Capsules Elmiron Neocylate Vanquish  Axotal Emagrin Norgesic Verin  Azathioprine Empirin or Empirin with Codeine Normiflo Vitamin E  Azolid Emprazil Nuprin Voltaren  Bayer Aspirin plain, buffered or children's or timed BC Tablets or powders Encaprin Orgaran Warfarin Sodium  Buff-a-Comp Enoxaparin Orudis Zorpin  Buff-a-Comp with Codeine Equegesic Os-Cal-Gesic   Buffaprin Excedrin plain, buffered or Extra Strength Oxalid   Bufferin Arthritis Strength Feldene  Oxphenbutazone   Bufferin plain or Extra Strength Feldene Capsules Oxycodone with Aspirin   Bufferin with Codeine Fenoprofen Fenoprofen Pabalate or Pabalate-SF   Buffets II Flogesic Panagesic   Buffinol plain or Extra Strength Florinal or Florinal with Codeine Panwarfarin   Buf-Tabs Flurbiprofen Penicillamine   Butalbital Compound Four-way cold tablets Penicillin   Butazolidin Fragmin Pepto-Bismol   Carbenicillin Geminisyn Percodan   Carna Arthritis Reliever Geopen Persantine   Carprofen Gold's salt Persistin   Chloramphenicol Goody's Phenylbutazone   Chloromycetin Haltrain Piroxlcam   Clmetidine heparin Plaquenil   Cllnoril Hyco-pap Ponstel   Clofibrate Hydroxy chloroquine Propoxyphen         Before stopping any of these medications, be sure to consult the physician who ordered them.  Some, such as Coumadin (Warfarin) are ordered to prevent or treat serious conditions such as "deep thrombosis", "pumonary embolisms", and other heart problems.  The amount of time that you may need off of the medication may also vary with the medication and the reason for which you were taking it.  If you are taking any of these medications, please make sure you notify your pain physician before you undergo any procedures.         Sacroiliac (SI) Joint Injection Patient Information  Description: The sacroiliac joint connects the scrum (very low back and tailbone) to the ilium (a pelvic bone which also forms half of the hip joint).  Normally this joint experiences very little motion.  When this joint becomes inflamed or unstable low back and or hip and pelvis pain may result.  Injection of this joint with local anesthetics (numbing medicines) and steroids can provide diagnostic information and reduce pain.  This injection is performed with the aid of x-ray guidance into the tailbone area while you are lying on your stomach.   You may experience an electrical sensation down the leg while this is being  done.  You may also experience numbness.  We also may ask if we are reproducing your normal pain  during the injection.  Conditions which may be treated SI injection:   Low back, buttock, hip or leg pain  Preparation for the Injection:  1. Do not eat any solid food or dairy products within 8 hours of your appointment.  2. You may drink clear liquids up to 3 hours before appointment.  Clear liquids include water, black coffee, juice or soda.  No milk or cream please. 3. You may take your regular medications, including pain medications with a sip of water before your appointment.  Diabetics should hold regular insulin (if take separately) and take 1/2 normal NPH dose the morning of the procedure.  Carry some sugar containing items with you to your appointment. 4. A driver must accompany you and be prepared to drive you home after your procedure. 5. Bring all of your current medications with you. 6. An IV may be inserted and sedation may be given at the discretion of the physician. 7. A blood pressure cuff, EKG and other monitors will often be applied during the procedure.  Some patients may need to have extra oxygen administered for a short period.  8. You will be asked to provide medical information, including your allergies, prior to the procedure.  We must know immediately if you are taking blood thinners (like Coumadin/Warfarin) or if you are allergic to IV iodine contrast (dye).  We must know if you could possible be pregnant.  Possible side effects:   Bleeding from needle site  Infection (rare, may require surgery)  Nerve injury (rare)  Numbness & tingling (temporary)  A brief convulsion or seizure  Light-headedness (temporary)  Pain at injection site (several days)  Decreased blood pressure (temporary)  Weakness in the leg (temporary)   Call if you experience:   New onset weakness or numbness of an extremity below the injection site that last more than 8 hours.  Hives  or difficulty breathing ( go to the emergency room)  Inflammation or drainage at the injection site  Any new symptoms which are concerning to you  Please note:  Although the local anesthetic injected can often make your back/ hip/ buttock/ leg feel good for several hours after the injections, the pain will likely return.  It takes 3-7 days for steroids to work in the sacroiliac area.  You may not notice any pain relief for at least that one week.  If effective, we will often do a series of three injections spaced 3-6 weeks apart to maximally decrease your pain.  After the initial series, we generally will wait some months before a repeat injection of the same type.  If you have any questions, please call [phone] [facility]

## 2016-08-05 ENCOUNTER — Ambulatory Visit
Admission: RE | Admit: 2016-08-05 | Discharge: 2016-08-05 | Disposition: A | Discharge status: Home / Self Care | Payer: Medicaid Other | Source: Ambulatory Visit | Attending: Pain Medicine | Admitting: Pain Medicine

## 2016-08-05 ENCOUNTER — Ambulatory Visit (HOSPITAL_BASED_OUTPATIENT_CLINIC_OR_DEPARTMENT_OTHER): Payer: Medicaid Other | Admitting: Pain Medicine

## 2016-08-05 ENCOUNTER — Encounter: Payer: Self-pay | Admitting: Pain Medicine

## 2016-08-05 VITALS — BP 117/61 | HR 70 | Temp 96.6°F | Resp 16 | Ht 64.0 in | Wt 190.0 lb

## 2016-08-05 DIAGNOSIS — M545 Low back pain, unspecified: Secondary | ICD-10-CM

## 2016-08-05 DIAGNOSIS — G8929 Other chronic pain: Secondary | ICD-10-CM | POA: Insufficient documentation

## 2016-08-05 DIAGNOSIS — M533 Sacrococcygeal disorders, not elsewhere classified: Secondary | ICD-10-CM

## 2016-08-05 DIAGNOSIS — M47816 Spondylosis without myelopathy or radiculopathy, lumbar region: Secondary | ICD-10-CM

## 2016-08-05 DIAGNOSIS — M1288 Other specific arthropathies, not elsewhere classified, other specified site: Secondary | ICD-10-CM

## 2016-08-05 MED ORDER — FENTANYL CITRATE (PF) 100 MCG/2ML IJ SOLN: 25.0000 ug | INTRAMUSCULAR | Status: DC | PRN

## 2016-08-05 MED ORDER — ROPIVACAINE HCL 2 MG/ML IJ SOLN: 9.0000 mL | Freq: Once | INTRAMUSCULAR | Status: DC

## 2016-08-05 MED ORDER — MIDAZOLAM HCL 5 MG/5ML IJ SOLN: 1.0000 mg | INTRAMUSCULAR | Status: DC | PRN

## 2016-08-05 MED ORDER — METHYLPREDNISOLONE ACETATE 80 MG/ML IJ SUSP: 80.0000 mg | Freq: Once | INTRAMUSCULAR | Status: DC

## 2016-08-05 MED ORDER — TRIAMCINOLONE ACETONIDE 40 MG/ML IJ SUSP: 40.0000 mg | Freq: Once | INTRAMUSCULAR | Status: DC

## 2016-08-05 MED ORDER — ROPIVACAINE HCL 2 MG/ML IJ SOLN
INTRAMUSCULAR | Status: AC
  Administered 2016-08-05: 09:00:00
  Filled 2016-08-05: qty 40

## 2016-08-05 MED ORDER — FENTANYL CITRATE (PF) 100 MCG/2ML IJ SOLN
INTRAMUSCULAR | Status: AC
  Administered 2016-08-05: 100 ug via INTRAVENOUS
  Filled 2016-08-05: qty 2

## 2016-08-05 MED ORDER — ROPIVACAINE HCL 2 MG/ML IJ SOLN
9.0000 mL | Freq: Once | INTRAMUSCULAR | Status: DC
Start: 2016-08-05 — End: 2017-05-02

## 2016-08-05 MED ORDER — METHYLPREDNISOLONE ACETATE 80 MG/ML IJ SUSP
INTRAMUSCULAR | Status: AC
  Administered 2016-08-05: 09:00:00
  Filled 2016-08-05: qty 1

## 2016-08-05 MED ORDER — LACTATED RINGERS IV SOLN: 1000.0000 mL | Freq: Once | INTRAVENOUS | Status: DC

## 2016-08-05 MED ORDER — LIDOCAINE HCL (PF) 1 % IJ SOLN
10.0000 mL | Freq: Once | INTRAMUSCULAR | Status: AC
  Administered 2016-08-05: 10 mL

## 2016-08-05 MED ORDER — ROPIVACAINE HCL 2 MG/ML IJ SOLN: 4.0000 mL | Freq: Once | INTRAMUSCULAR | Status: DC

## 2016-08-05 MED ORDER — TRIAMCINOLONE ACETONIDE 40 MG/ML IJ SUSP
INTRAMUSCULAR | Status: AC
  Administered 2016-08-05: 09:00:00
  Filled 2016-08-05: qty 2

## 2016-08-05 MED ORDER — MIDAZOLAM HCL 5 MG/5ML IJ SOLN
INTRAMUSCULAR | Status: AC
  Administered 2016-08-05: 5 mg via INTRAVENOUS
  Filled 2016-08-05: qty 5

## 2016-08-05 NOTE — Patient Instructions (Addendum)
Steps to Quit Smoking Smoking tobacco can be bad for your health. It can also affect almost every organ in your body. Smoking puts you and people around you at risk for many serious long-lasting (chronic) diseases. Quitting smoking is hard, but it is one of the best things that you can do for your health. It is never too late to quit. What are the benefits of quitting smoking? When you quit smoking, you lower your risk for getting serious diseases and conditions. They can include:  Lung cancer or lung disease.  Heart disease.  Stroke.  Heart attack.  Not being able to have children (infertility).  Weak bones (osteoporosis) and broken bones (fractures). If you have coughing, wheezing, and shortness of breath, those symptoms may get better when you quit. You may also get sick less often. If you are pregnant, quitting smoking can help to lower your chances of having a baby of low birth weight. What can I do to help me quit smoking? Talk with your doctor about what can help you quit smoking. Some things you can do (strategies) include:  Quitting smoking totally, instead of slowly cutting back how much you smoke over a period of time.  Going to in-person counseling. You are more likely to quit if you go to many counseling sessions.  Using resources and support systems, such as:  Online chats with a counselor.  Phone quitlines.  Printed self-help materials.  Support groups or group counseling.  Text messaging programs.  Mobile phone apps or applications.  Taking medicines. Some of these medicines may have nicotine in them. If you are pregnant or breastfeeding, do not take any medicines to quit smoking unless your doctor says it is okay. Talk with your doctor about counseling or other things that can help you. Talk with your doctor about using more than one strategy at the same time, such as taking medicines while you are also going to in-person counseling. This can help make quitting  easier. What things can I do to make it easier to quit? Quitting smoking might feel very hard at first, but there is a lot that you can do to make it easier. Take these steps:  Talk to your family and friends. Ask them to support and encourage you.  Call phone quitlines, reach out to support groups, or work with a counselor.  Ask people who smoke to not smoke around you.  Avoid places that make you want (trigger) to smoke, such as:  Bars.  Parties.  Smoke-break areas at work.  Spend time with people who do not smoke.  Lower the stress in your life. Stress can make you want to smoke. Try these things to help your stress:  Getting regular exercise.  Deep-breathing exercises.  Yoga.  Meditating.  Doing a body scan. To do this, close your eyes, focus on one area of your body at a time from head to toe, and notice which parts of your body are tense. Try to relax the muscles in those areas.  Download or buy apps on your mobile phone or tablet that can help you stick to your quit plan. There are many free apps, such as QuitGuide from the CDC (Centers for Disease Control and Prevention). You can find more support from smokefree.gov and other websites. This information is not intended to replace advice given to you by your health care provider. Make sure you discuss any questions you have with your health care provider. Document Released: 04/06/2009 Document Revised: 02/06/2016 Document   Reviewed: 10/25/2014 Elsevier Interactive Patient Education  2017 [employer]. Pain Management Discharge Instructions  General Discharge Instructions :  If you need to reach your doctor call: Monday-Friday 8:00 am - 4:00 pm at [phone] or toll free [phone].  After clinic hours [phone] to have operator reach doctor.  Bring all of your medication bottles to all your appointments in the pain clinic.  To cancel or reschedule your appointment with Pain Management please remember to call  24 hours in advance to avoid a fee.  Refer to the educational materials which you have been given on: General Risks, I had my Procedure. Discharge Instructions, Post Sedation.  Post Procedure Instructions:  The drugs you were given will stay in your system until tomorrow, so for the next 24 hours you should not drive, make any legal decisions or drink any alcoholic beverages.  You may eat anything you prefer, but it is better to start with liquids then soups and crackers, and gradually work up to solid foods.  Please notify your doctor immediately if you have any unusual bleeding, trouble breathing or pain that is not related to your normal pain.  Depending on the type of procedure that was done, some parts of your body may feel week and/or numb.  This usually clears up by tonight or the next day.  Walk with the use of an assistive device or accompanied by an adult for the 24 hours.  You may use ice on the affected area for the first 24 hours.  Put ice in a Ziploc bag and cover with a towel and place against area 15 minutes on 15 minutes off.  You may switch to heat after 24 hours.Sacroiliac (SI) Joint Injection Patient Information  Description: The sacroiliac joint connects the scrum (very low back and tailbone) to the ilium (a pelvic bone which also forms half of the hip joint).  Normally this joint experiences very little motion.  When this joint becomes inflamed or unstable low back and or hip and pelvis pain may result.  Injection of this joint with local anesthetics (numbing medicines) and steroids can provide diagnostic information and reduce pain.  This injection is performed with the aid of x-ray guidance into the tailbone area while you are lying on your stomach.   You may experience an electrical sensation down the leg while this is being done.  You may also experience numbness.  We also may ask if we are reproducing your normal pain during the injection.  Conditions which may be  treated SI injection:   Low back, buttock, hip or leg pain  Preparation for the Injection:  1. Do not eat any solid food or dairy products within 8 hours of your appointment.  2. You may drink clear liquids up to 3 hours before appointment.  Clear liquids include water, black coffee, juice or soda.  No milk or cream please. 3. You may take your regular medications, including pain medications with a sip of water before your appointment.  Diabetics should hold regular insulin (if take separately) and take 1/2 normal NPH dose the morning of the procedure.  Carry some sugar containing items with you to your appointment. 4. A driver must accompany you and be prepared to drive you home after your procedure. 5. Bring all of your current medications with you. 6. An IV may be inserted and sedation may be given at the discretion of the physician. 7. A blood pressure cuff, EKG and other monitors will often be applied during  the procedure.  Some patients may need to have extra oxygen administered for a short period.  8. You will be asked to provide medical information, including your allergies, prior to the procedure.  We must know immediately if you are taking blood thinners (like Coumadin/Warfarin) or if you are allergic to IV iodine contrast (dye).  We must know if you could possible be pregnant.  Possible side effects:   Bleeding from needle site  Infection (rare, may require surgery)  Nerve injury (rare)  Numbness & tingling (temporary)  A brief convulsion or seizure  Light-headedness (temporary)  Pain at injection site (several days)  Decreased blood pressure (temporary)  Weakness in the leg (temporary)   Call if you experience:   New onset weakness or numbness of an extremity below the injection site that last more than 8 hours.  Hives or difficulty breathing ( go to the emergency room)  Inflammation or drainage at the injection site  Any new symptoms which are concerning to  you  Please note:  Although the local anesthetic injected can often make your back/ hip/ buttock/ leg feel good for several hours after the injections, the pain will likely return.  It takes 3-7 days for steroids to work in the sacroiliac area.  You may not notice any pain relief for at least that one week.  If effective, we will often do a series of three injections spaced 3-6 weeks apart to maximally decrease your pain.  After the initial series, we generally will wait some months before a repeat injection of the same type.  If you have any questions, please call [phone] [facility]  Facet Joint Block The facet joints connect the bones of the spine (vertebrae). They make it possible for you to bend, twist, and make other movements with your spine. They also prevent you from overbending, overtwisting, and making other excessive movements.  A facet joint block is a procedure where a numbing medicine (anesthetic) is injected into a facet joint. Often, a type of anti-inflammatory medicine called a steroid is also injected. A facet joint block may be done for two reasons:   Diagnosis. A facet joint block may be done as a test to see whether neck or back pain is caused by a worn-down or infected facet joint. If the pain gets better after a facet joint block, it means the pain is probably coming from the facet joint. If the pain does not get better, it means the pain is probably not coming from the facet joint.   Therapy. A facet joint block may be done to relieve neck or back pain caused by a facet joint. A facet joint block is only done as a therapy if the pain does not improve with medicine, exercise programs, physical therapy, and other forms of pain management. LET [facility] CARE PROVIDER KNOW ABOUT:   Any allergies you have.   All medicines you are taking, including vitamins, herbs, eyedrops, and over-the-counter medicines and creams.   Previous  problems you or members of your family have had with the use of anesthetics.   Any blood disorders you have had.   Other health problems you have. RISKS AND COMPLICATIONS Generally, having a facet joint block is safe. However, as with any procedure, complications can occur. Possible complications associated with having a facet joint block include:   Bleeding.   Injury to a nerve near the injection site.   Pain at the injection site.  Weakness or numbness in areas controlled by nerves near the injection site.   Infection.   Temporary fluid retention.   Allergic reaction to anesthetics or medicines used during the procedure. BEFORE THE PROCEDURE   Follow your health care provider's instructions if you are taking dietary supplements or medicines. You may need to stop taking them or reduce your dosage.   Do not take any new dietary supplements or medicines without asking your health care provider first.   Follow your health care provider's instructions about eating and drinking before the procedure. You may need to stop eating and drinking several hours before the procedure.   Arrange to have an adult drive you home after the procedure. PROCEDURE  You may need to remove your clothing and dress in an open-back gown so that your health care provider can access your spine.   The procedure will be done while you are lying on an X-ray table. Most of the time you will be asked to lie on your stomach, but you may be asked to lie in a different position if an injection will be made in your neck.   Special machines will be used to monitor your oxygen levels, heart rate, and blood pressure.   If an injection will be made in your neck, an intravenous (IV) tube will be inserted into one of your veins. Fluids and medicine will flow directly into your body through the IV tube.   The area over the facet joint where the injection will be made will be cleaned with an antiseptic  soap. The surrounding skin will be covered with sterile drapes.   An anesthetic will be applied to your skin to make the injection area numb. You may feel a temporary stinging or burning sensation.   A video X-ray machine will be used to locate the joint. A contrast dye may be injected into the facet joint area to help with locating the joint.   When the joint is located, an anesthetic medicine will be injected into the joint through the needle.   Your health care provider will ask you whether you feel pain relief. If you do feel relief, a steroid may be injected to provide pain relief for a longer period of time. If you do not feel relief or feel only partial relief, additional injections of an anesthetic may be made in other facet joints.   The needle will be removed, the skin will be cleansed, and bandages will be applied.  AFTER THE PROCEDURE   You will be observed for 15-30 minutes before being allowed to go home. Do not drive. Have an adult drive you or take a taxi or public transportation instead.   If you feel pain relief, the pain will return in several hours or days when the anesthetic wears off.   You may feel pain relief 2-14 days after the procedure. The amount of time this relief lasts varies from person to person.   It is normal to feel some tenderness over the injected area(s) for 2 days following the procedure.   If you have diabetes, you may have a temporary increase in blood sugar. This information is not intended to replace advice given to you by your health care provider. Make sure you discuss any questions you have with your health care provider. Document Released: 10/30/2006 Document Revised: 07/01/2014 Document Reviewed: 03/06/2015 Elsevier Interactive Patient Education  2017 [employer] Refer to this sheet in the next few weeks.  These instructions provide you with information on caring for yourself after your procedure.  Your health care provider may also give you more specific instructions. Your treatment has been planned according to current medical practices, but problems sometimes occur. Call your health care provider if you have any problems or questions after your procedure. HOME CARE INSTRUCTIONS   Keep track of the amount of pain relief you feel and how long it lasts.  Limit pain medicine within the first 4-6 hours after the procedure as directed by your health care provider.  Resume taking dietary supplements and medicines as directed by your health care provider.  You may resume your regular diet.  Do not apply heat near or over the injection site(s) for 24 hours.   Do not take a bath or soak in water (such as a pool or lake) for 24 hours.  Do not drive for 24 hours unless approved by your health care provider.  Avoid strenuous activity for 24 hours.  Remove your bandages the morning after the procedure.   If the injection site is tender, applying an ice pack may relieve some tenderness. To do this:  Put ice in a bag.  Place a towel between your skin and the bag.  Leave the ice on for 15-20 minutes, 3-4 times a day.  Keep follow-up appointments as directed by your health care provider. SEEK MEDICAL CARE IF:   Your pain is not controlled by your medicines.   There is drainage from the injection site.   There is significant bleeding or swelling at the injection site.  You have diabetes and your blood sugar is above 180 mg/dL. SEEK IMMEDIATE MEDICAL CARE IF:   You develop a fever of 101F (38.3C) or greater.   You have worsening pain or swelling around the injection site.   You have red streaking around the injection site.   You develop severe pain that is not controlled by your medicines.   You develop a headache, stiff neck, nausea, or vomiting.   Your eyes become very sensitive to light.   You have weakness, paralysis, or tingling in your arms or legs that  was not present before the procedure.   You develop difficulty urinating or breathing.  This information is not intended to replace advice given to you by your health care provider. Make sure you discuss any questions you have with your health care provider. Document Released: 05/27/2012 Document Revised: 07/01/2014 Document Reviewed: 03/06/2015 Elsevier Interactive Patient Education  2017 [employer].

## 2016-08-05 NOTE — Progress Notes (Signed)
Safety precautions to be maintained throughout the outpatient stay will include: orient to surroundings, keep bed in low position, maintain call bell within reach at all times, provide assistance with transfer out of bed and ambulation.  

## 2016-08-05 NOTE — Progress Notes (Signed)
Patient's Name: Sara Munoz  MRN: [id number]  Referring Provider: Milinda Pointer, MD  DOB: 1966-04-01  PCP: [place]  DOS: 08/05/2016  Note by: Kathlen Brunswick. Dossie Arbour, MD  Service setting: Ambulatory outpatient  Location: [facility] (AMB) Pain Management Facility  Visit type: Procedure  Specialty: Interventional Pain Management  Patient type: Established   Primary Reason for Visit: Interventional Pain Management Treatment. CC: Back Pain (low)  Procedure:  Anesthesia, Analgesia, Anxiolysis:  Procedure #1: Type: Diagnostic Medial Branch Facet Block Region: Lumbar Level: L2, L3, L4, L5, & S1 Medial Branch Level(s) Laterality: Bilateral  Procedure #2: Type: Diagnostic Sacroiliac Joint Block Region: Posterior Lumbosacral Level: PSIS (Posterior Superior Iliac Spine) Sacroiliac Joint Laterality: Bilateral  Type: Local Anesthesia with Moderate (Conscious) Sedation Local Anesthetic: Lidocaine 1% Route: Intravenous (IV) IV Access: Secured Sedation: Meaningful verbal contact was maintained at all times during the procedure  Indication(s): Analgesia and Anxiety  Indications: 1. Chronic low back pain (Location of Primary Source of Pain) (Bilateral) (L>R)   2. Lumbar facet syndrome (Bilateral) (L>R)   3. Chronic sacroiliac joint pain (Bilateral) (L>R)    Pain Score: Pre-procedure: 4 /10 Post-procedure: 0-No pain/10  Pre-op Assessment:  Previous date of service: 07/31/16 Service provided: Evaluation (post procedure eval) Sara Munoz is a 51 y.o. (year old), female patient, seen today for interventional treatment. She  has a past surgical history that includes ORIF femur fracture; ORIF tibia & fibula fractures; hemorroids; Ovary surgery; and Cardiac catheterization (Left, 02/22/2016). Her primarily concern today is the Back Pain (low)  Initial Vital Signs: Blood pressure 116/82, pulse 83, temperature 98.6 F (37 C), resp. rate 18, height 5\' 4"  (1.626 m), weight 190 lb (86.2 kg),  SpO2 96 %. BMI: 32.61 kg/m  Risk Assessment: Allergies: Reviewed. She is allergic to levofloxacin; flexeril [cyclobenzaprine]; keflex [cephalexin]; ketorolac; toradol [ketorolac tromethamine]; tramadol; and zoloft [sertraline hcl].  Allergy Precautions: None required Coagulopathies: "Reviewed. None identified.  Blood-thinner therapy: None at this time Active Infection(s): Reviewed. None identified. Sara Munoz is afebrile  Site Confirmation: Sara Munoz was asked to confirm the procedure and laterality before marking the site Procedure checklist: Completed Consent: Before the procedure and under the influence of no sedative(s), amnesic(s), or anxiolytics, the patient was informed of the treatment options, risks and possible complications. To fulfill our ethical and legal obligations, as recommended by the American Medical Association's Code of Ethics, I have informed the patient of my clinical impression; the nature and purpose of the treatment or procedure; the risks, benefits, and possible complications of the intervention; the alternatives, including doing nothing; the risk(s) and benefit(s) of the alternative treatment(s) or procedure(s); and the risk(s) and benefit(s) of doing nothing. The patient was provided information about the general risks and possible complications associated with the procedure. These may include, but are not limited to: failure to achieve desired goals, infection, bleeding, organ or nerve damage, allergic reactions, paralysis, and death. In addition, the patient was informed of those risks and complications associated to Spine-related procedures, such as failure to decrease pain; infection (i.e.: Meningitis, epidural or intraspinal abscess); bleeding (i.e.: epidural hematoma, subarachnoid hemorrhage, or any other type of intraspinal or peri-dural bleeding); organ or nerve damage (i.e.: Any type of peripheral nerve, nerve root, or spinal cord injury) with subsequent damage  to sensory, motor, and/or autonomic systems, resulting in permanent pain, numbness, and/or weakness of one or several areas of the body; allergic reactions; (i.e.: anaphylactic reaction); and/or death. Furthermore, the patient was informed of those risks and complications  associated with the medications. These include, but are not limited to: allergic reactions (i.e.: anaphylactic or anaphylactoid reaction(s)); adrenal axis suppression; blood sugar elevation that in diabetics may result in ketoacidosis or comma; water retention that in patients with history of congestive heart failure may result in shortness of breath, pulmonary edema, and decompensation with resultant heart failure; weight gain; swelling or edema; medication-induced neural toxicity; particulate matter embolism and blood vessel occlusion with resultant organ, and/or nervous system infarction; and/or aseptic necrosis of one or more joints. Finally, the patient was informed that Medicine is not an exact science; therefore, there is also the possibility of unforeseen or unpredictable risks and/or possible complications that may result in a catastrophic outcome. The patient indicated having understood very clearly. We have given the patient no guarantees and we have made no promises. Enough time was given to the patient to ask questions, all of which were answered to the patient's satisfaction. Sara Munoz has indicated that she wanted to continue with the procedure. Attestation: I, the ordering provider, attest that I have discussed with the patient the benefits, risks, side-effects, alternatives, likelihood of achieving goals, and potential problems during recovery for the procedure that I have provided informed consent. Date: 08/05/2016; Time: 8:19 AM  Pre-Procedure Preparation:  Monitoring: As per clinic protocol. Respiration, ETCO2, SpO2, BP, heart rate and rhythm monitor placed and checked for adequate function Safety Precautions: Patient  was assessed for positional comfort and pressure points before starting the procedure. Time-out: I initiated and conducted the "Time-out" before starting the procedure, as per protocol. The patient was asked to participate by confirming the accuracy of the "Time Out" information. Verification of the correct person, site, and procedure were performed and confirmed by me, the nursing staff, and the patient. "Time-out" conducted as per Joint Commission's Universal Protocol (UP.01.01.01). "Time-out" Date & Time: 08/05/2016; 0839 hrs.  Description of Procedure #1 Process:   Time-out: "Time-out" completed before starting procedure, as per protocol. Position: Prone Target Area: For Lumbar Facet blocks, the target is the groove formed by the junction of the transverse process and superior articular process. For the L5 dorsal ramus, the target is the notch between superior articular process and sacral ala. For the S1 dorsal ramus, the target is the superior and lateral edge of the posterior S1 Sacral foramen. Approach: Paramedial approach. Area Prepped: Entire Posterior Lumbosacral Region Prepping solution: ChloraPrep (2% chlorhexidine gluconate and 70% isopropyl alcohol) Safety Precautions: Aspiration looking for blood return was conducted prior to all injections. At no point did we inject any substances, as a needle was being advanced. No attempts were made at seeking any paresthesias. Safe injection practices and needle disposal techniques used. Medications properly checked for expiration dates. SDV (single dose vial) medications used.  Description of the Procedure: Protocol guidelines were followed. The patient was placed in position over the fluoroscopy table. The target area was identified and the area prepped in the usual manner. Skin desensitized using vapocoolant spray. Skin & deeper tissues infiltrated with local anesthetic. Appropriate amount of time allowed to pass for local anesthetics to take effect.  The procedure needle was introduced through the skin, ipsilateral to the reported pain, and advanced to the target area. Employing the "Medial Branch Technique", the needles were advanced to the angle made by the superior and medial portion of the transverse process, and the lateral and inferior portion of the superior articulating process of the targeted vertebral bodies. This area is known as "Burton's Eye" or the "Eye of the [place]  Dog". A procedure needle was introduced through the skin, and this time advanced to the angle made by the superior and medial border of the sacral ala, and the lateral border of the S1 vertebral body. This last needle was later repositioned at the superior and lateral border of the posterior S1 foramen. Negative aspiration confirmed. Solution injected in intermittent fashion, asking for systemic symptoms every 0.5cc of injectate. The needles were then removed and the area cleansed, making sure to leave some of the prepping solution back to take advantage of its long term bactericidal properties. Start Time: 0840 hrs. Materials:  Needle(s) Type: Regular needle Gauge: 22G Length: 3.5-in Medication(s): We administered lidocaine (PF), ropivacaine (PF) 2 mg/mL (0.2%), methylPREDNISolone acetate, triamcinolone acetonide, fentaNYL, and midazolam. Please see chart orders for dosing details.  Description of Procedure # 2 Process:   Position: Prone Target Area: For upper sacroiliac joint block(s), the target is the superior and posterior margin of the sacroiliac joint. Approach: Ipsilateral approach. Area Prepped: Entire Posterior Lumbosacral Region Prepping solution: ChloraPrep (2% chlorhexidine gluconate and 70% isopropyl alcohol) Safety Precautions: Aspiration looking for blood return was conducted prior to all injections. At no point did we inject any substances, as a needle was being advanced. No attempts were made at seeking any paresthesias. Safe injection practices and  needle disposal techniques used. Medications properly checked for expiration dates. SDV (single dose vial) medications used. Description of the Procedure: Protocol guidelines were followed. The patient was placed in position over the fluoroscopy table. The target area was identified and the area prepped in the usual manner. Skin desensitized using vapocoolant spray. Skin & deeper tissues infiltrated with local anesthetic. Appropriate amount of time allowed to pass for local anesthetics to take effect. The procedure needle was advanced under fluoroscopic guidance into the sacroiliac joint until a firm endpoint was obtained. Proper needle placement secured. Negative aspiration confirmed. Solution injected in intermittent fashion, asking for systemic symptoms every 0.5cc of injectate. The needles were then removed and the area cleansed, making sure to leave some of the prepping solution back to take advantage of its long term bactericidal properties. End Time: 0854 hrs. Materials:  Needle(s) Type: Regular needle Gauge: 22G Length: 3.5-in Medication(s): We administered lidocaine (PF), ropivacaine (PF) 2 mg/mL (0.2%), methylPREDNISolone acetate, triamcinolone acetonide, fentaNYL, and midazolam. Please see chart orders for dosing details.   Imaging Guidance (Spinal):  Type of Imaging Technique: Fluoroscopy Guidance (Spinal) Indication(s): Assistance in needle guidance and placement for procedures requiring needle placement in or near specific anatomical locations not easily accessible without such assistance. Exposure Time: Please see nurses notes. Contrast: None used. Fluoroscopic Guidance: I was personally present during the use of fluoroscopy. "Tunnel Vision Technique" used to obtain the best possible view of the target area. Parallax error corrected before commencing the procedure. "Direction-depth-direction" technique used to introduce the needle under continuous pulsed fluoroscopy. Once target was  reached, antero-posterior, oblique, and lateral fluoroscopic projection used confirm needle placement in all planes. Images permanently stored in EMR. Interpretation: No contrast injected. I personally interpreted the imaging intraoperatively. Adequate needle placement confirmed in multiple planes. Permanent images saved into the patient's record.  Antibiotic Prophylaxis:  Indication(s): None identified Antibiotic given: None  Post-operative Assessment:  EBL: None Complications: No immediate post-treatment complications observed by team, or reported by patient. Note: The patient tolerated the entire procedure well. A repeat set of vitals were taken after the procedure and the patient was kept under observation following institutional policy, for this type of procedure. Post-procedural neurological  assessment was performed, showing return to baseline, prior to discharge. The patient was provided with post-procedure discharge instructions, including a section on how to identify potential problems. Should any problems arise concerning this procedure, the patient was given instructions to immediately contact us, at any time, without hesitation. In any case, we plan to contact the patient by telephone for a follow-up status report regarding this interventional procedure. Comments:  No additional relevant information.  Plan of Care  Disposition: Discharge home  Discharge Date & Time: 08/05/2016; 0924 hrs.  Physician-requested Follow-up:  Return in about 2 weeks (around 08/19/2016) for Post-Procedure evaluation.  Future Appointments Date Time Provider [place]  08/07/2016 2:30 PM [name], MD [facility] None  09/04/2016 1:45 PM Milinda Pointer, MD [facility] None   Medications ordered for procedure: Meds ordered this encounter  Medications  . fentaNYL (SUBLIMAZE) injection 25-50 mcg    Make sure Narcan is available in the pyxis when using this medication. In the event of respiratory  depression (RR< 8/min): Titrate NARCAN (naloxone) in increments of 0.1 to 0.2 mg IV at 2-3 minute intervals, until desired degree of reversal.  . lactated ringers infusion 1,000 mL  . midazolam (VERSED) 5 MG/5ML injection 1-2 mg    Make sure Flumazenil is available in the pyxis when using this medication. If oversedation occurs, administer 0.2 mg IV over 15 sec. If after 45 sec no response, administer 0.2 mg again over 1 min; may repeat at 1 min intervals; not to exceed 4 doses (1 mg)  . triamcinolone acetonide (KENALOG-40) injection 40 mg  . lidocaine (PF) (XYLOCAINE) 1 % injection 10 mL  . ropivacaine (PF) 2 mg/mL (0.2%) (NAROPIN) injection 9 mL  . ropivacaine (PF) 2 mg/mL (0.2%) (NAROPIN) injection 9 mL  . triamcinolone acetonide (KENALOG-40) injection 40 mg  . methylPREDNISolone acetate (DEPO-MEDROL) injection 80 mg  . ropivacaine (PF) 2 mg/mL (0.2%) (NAROPIN) injection 4 mL  . ropivacaine (PF) 2 mg/mL (0.2%) (NAROPIN) 2 MG/ML injection    Donneta Romberg, Dena: cabinet override  . methylPREDNISolone acetate (DEPO-MEDROL) 80 MG/ML injection    Donneta Romberg, Dena: cabinet override  . triamcinolone acetonide (KENALOG-40) 40 MG/ML injection    Donneta Romberg, Dena: cabinet override  . fentaNYL (SUBLIMAZE) 100 MCG/2ML injection    Donneta Romberg, Dena: cabinet override  . midazolam (VERSED) 5 MG/5ML injection    Donneta Romberg, Dena: cabinet override   Medications administered: We administered lidocaine (PF), ropivacaine (PF) 2 mg/mL (0.2%), methylPREDNISolone acetate, triamcinolone acetonide, fentaNYL, and midazolam.  See the medical record for exact dosing, route, and time of administration.  Lab-work, Procedure(s), & Referral(s) Ordered: Orders Placed This Encounter  Procedures  . SACROILIAC JOINT INJECTINS  . DG C-Arm 1-60 Min-No Report  . Discharge instructions  . Follow-up  . Informed Consent Details: Transcribe to consent form and obtain patient signature  . Provider attestation of informed consent for  procedure/surgical case  . Verify informed consent   Imaging Ordered: Results for orders placed in visit on 06/26/16  DG C-Arm 1-60 Min-No Report   Narrative There is no Radiologist interpretation  for this exam.   New Prescriptions   No medications on file   Primary Care Physician: [facility] Location: [facility] Note by: Kathlen Brunswick. Dossie Arbour, M.D, DABA, DABAPM, DABPM, DABIPP, FIPP Date: 08/05/2016; Time: 11:48 AM  Disclaimer:  Medicine is not an Chief Strategy Officer. The only guarantee in medicine is that nothing is guaranteed. It is important to note that the decision to proceed with this intervention was  based on the information collected from the patient. The Data and conclusions were drawn from the patient's questionnaire, the interview, and the physical examination. Because the information was provided in large part by the patient, it cannot be guaranteed that it has not been purposely or unconsciously manipulated. Every effort has been made to obtain as much relevant data as possible for this evaluation. It is important to note that the conclusions that lead to this procedure are derived in large part from the available data. Always take into account that the treatment will also be dependent on availability of resources and existing treatment guidelines, considered by other Pain Management Practitioners as being common knowledge and practice, at the time of the intervention. For Medico-Legal purposes, it is also important to point out that variation in procedural techniques and pharmacological choices are the acceptable norm. The indications, contraindications, technique, and results of the above procedure should only be interpreted and judged by a Board-Certified Interventional Pain Specialist with extensive familiarity and expertise in the same exact procedure and technique. Attempts at providing opinions without similar or greater experience and  expertise than that of the treating physician will be considered as inappropriate and unethical, and shall result in a formal complaint to the state medical board and applicable specialty societies.  Instructions provided at this appointment: Patient Instructions  Steps to Quit Smoking Smoking tobacco can be bad for your health. It can also affect almost every organ in your body. Smoking puts you and people around you at risk for many serious long-lasting (chronic) diseases. Quitting smoking is hard, but it is one of the best things that you can do for your health. It is never too late to quit. What are the benefits of quitting smoking? When you quit smoking, you lower your risk for getting serious diseases and conditions. They can include:  Lung cancer or lung disease.  Heart disease.  Stroke.  Heart attack.  Not being able to have children (infertility).  Weak bones (osteoporosis) and broken bones (fractures). If you have coughing, wheezing, and shortness of breath, those symptoms may get better when you quit. You may also get sick less often. If you are pregnant, quitting smoking can help to lower your chances of having a baby of low birth weight. What can I do to help me quit smoking? Talk with your doctor about what can help you quit smoking. Some things you can do (strategies) include:  Quitting smoking totally, instead of slowly cutting back how much you smoke over a period of time.  Going to in-person counseling. You are more likely to quit if you go to many counseling sessions.  Using resources and support systems, such as:  Online chats with a Social worker.  Phone quitlines.  Printed Furniture conservator/restorer.  Support groups or group counseling.  Text messaging programs.  Mobile phone apps or applications.  Taking medicines. Some of these medicines may have nicotine in them. If you are pregnant or breastfeeding, do not take any medicines to quit smoking unless your doctor  says it is okay. Talk with your doctor about counseling or other things that can help you. Talk with your doctor about using more than one strategy at the same time, such as taking medicines while you are also going to in-person counseling. This can help make quitting easier. What things can I do to make it easier to quit? Quitting smoking might feel very hard at first, but there is a lot that you can do to make it  easier. Take these steps:  Talk to your family and friends. Ask them to support and encourage you.  Call phone quitlines, reach out to support groups, or work with a Social worker.  Ask people who smoke to not smoke around you.  Avoid places that make you want (trigger) to smoke, such as:  Bars.  Parties.  Smoke-break areas at work.  Spend time with people who do not smoke.  Lower the stress in your life. Stress can make you want to smoke. Try these things to help your stress:  Getting regular exercise.  Deep-breathing exercises.  Yoga.  Meditating.  Doing a body scan. To do this, close your eyes, focus on one area of your body at a time from head to toe, and notice which parts of your body are tense. Try to relax the muscles in those areas.  Download or buy apps on your mobile phone or tablet that can help you stick to your quit plan. There are many free apps, such as QuitGuide from the [employer] Office manager for Disease Control and Prevention). You can find more support from smokefree.gov and other websites. This information is not intended to replace advice given to you by your health care provider. Make sure you discuss any questions you have with your health care provider. Document Released: 04/06/2009 Document Revised: 02/06/2016 Document Reviewed: 10/25/2014 Elsevier Interactive Patient Education  2017 [place]. Pain Management Discharge Instructions  General Discharge Instructions :  If you need to reach your doctor call: Monday-Friday 8:00 am - 4:00 pm at  [phone] or toll free [phone].  After clinic hours [phone] to have operator reach doctor.  Bring all of your medication bottles to all your appointments in the pain clinic.  To cancel or reschedule your appointment with Pain Management please remember to call 24 hours in advance to avoid a fee.  Refer to the educational materials which you have been given on: General Risks, I had my Procedure. Discharge Instructions, Post Sedation.  Post Procedure Instructions:  The drugs you were given will stay in your system until tomorrow, so for the next 24 hours you should not drive, make any legal decisions or drink any alcoholic beverages.  You may eat anything you prefer, but it is better to start with liquids then soups and crackers, and gradually work up to solid foods.  Please notify your doctor immediately if you have any unusual bleeding, trouble breathing or pain that is not related to your normal pain.  Depending on the type of procedure that was done, some parts of your body may feel week and/or numb.  This usually clears up by tonight or the next day.  Walk with the use of an assistive device or accompanied by an adult for the 24 hours.  You may use ice on the affected area for the first 24 hours.  Put ice in a Ziploc bag and cover with a towel and place against area 15 minutes on 15 minutes off.  You may switch to heat after 24 hours.Sacroiliac (SI) Joint Injection Patient Information  Description: The sacroiliac joint connects the scrum (very low back and tailbone) to the ilium (a pelvic bone which also forms half of the hip joint).  Normally this joint experiences very little motion.  When this joint becomes inflamed or unstable low back and or hip and pelvis pain may result.  Injection of this joint with local anesthetics (numbing medicines) and steroids can provide diagnostic information and reduce pain.  This injection is  performed with the aid of x-ray guidance into the  tailbone area while you are lying on your stomach.   You may experience an electrical sensation down the leg while this is being done.  You may also experience numbness.  We also may ask if we are reproducing your normal pain during the injection.  Conditions which may be treated SI injection:   Low back, buttock, hip or leg pain  Preparation for the Injection:  1. Do not eat any solid food or dairy products within 8 hours of your appointment.  2. You may drink clear liquids up to 3 hours before appointment.  Clear liquids include water, black coffee, juice or soda.  No milk or cream please. 3. You may take your regular medications, including pain medications with a sip of water before your appointment.  Diabetics should hold regular insulin (if take separately) and take 1/2 normal NPH dose the morning of the procedure.  Carry some sugar containing items with you to your appointment. 4. A driver must accompany you and be prepared to drive you home after your procedure. 5. Bring all of your current medications with you. 6. An IV may be inserted and sedation may be given at the discretion of the physician. 7. A blood pressure cuff, EKG and other monitors will often be applied during the procedure.  Some patients may need to have extra oxygen administered for a short period.  8. You will be asked to provide medical information, including your allergies, prior to the procedure.  We must know immediately if you are taking blood thinners (like Coumadin/Warfarin) or if you are allergic to IV iodine contrast (dye).  We must know if you could possible be pregnant.  Possible side effects:   Bleeding from needle site  Infection (rare, may require surgery)  Nerve injury (rare)  Numbness & tingling (temporary)  A brief convulsion or seizure  Light-headedness (temporary)  Pain at injection site (several days)  Decreased blood pressure (temporary)  Weakness in the leg (temporary)   Call if  you experience:   New onset weakness or numbness of an extremity below the injection site that last more than 8 hours.  Hives or difficulty breathing ( go to the emergency room)  Inflammation or drainage at the injection site  Any new symptoms which are concerning to you  Please note:  Although the local anesthetic injected can often make your back/ hip/ buttock/ leg feel good for several hours after the injections, the pain will likely return.  It takes 3-7 days for steroids to work in the sacroiliac area.  You may not notice any pain relief for at least that one week.  If effective, we will often do a series of three injections spaced 3-6 weeks apart to maximally decrease your pain.  After the initial series, we generally will wait some months before a repeat injection of the same type.  If you have any questions, please call [phone] [facility]  Facet Joint Block The facet joints connect the bones of the spine (vertebrae). They make it possible for you to bend, twist, and make other movements with your spine. They also prevent you from overbending, overtwisting, and making other excessive movements.  A facet joint block is a procedure where a numbing medicine (anesthetic) is injected into a facet joint. Often, a type of anti-inflammatory medicine called a steroid is also injected. A facet joint block may be done for two reasons:  Diagnosis. A facet joint block may be done as a test to see whether neck or back pain is caused by a worn-down or infected facet joint. If the pain gets better after a facet joint block, it means the pain is probably coming from the facet joint. If the pain does not get better, it means the pain is probably not coming from the facet joint.   Therapy. A facet joint block may be done to relieve neck or back pain caused by a facet joint. A facet joint block is only done as a therapy if the pain does not improve with  medicine, exercise programs, physical therapy, and other forms of pain management. LET [facility] CARE PROVIDER KNOW ABOUT:   Any allergies you have.   All medicines you are taking, including vitamins, herbs, eyedrops, and over-the-counter medicines and creams.   Previous problems you or members of your family have had with the use of anesthetics.   Any blood disorders you have had.   Other health problems you have. RISKS AND COMPLICATIONS Generally, having a facet joint block is safe. However, as with any procedure, complications can occur. Possible complications associated with having a facet joint block include:   Bleeding.   Injury to a nerve near the injection site.   Pain at the injection site.   Weakness or numbness in areas controlled by nerves near the injection site.   Infection.   Temporary fluid retention.   Allergic reaction to anesthetics or medicines used during the procedure. BEFORE THE PROCEDURE   Follow your health care provider's instructions if you are taking dietary supplements or medicines. You may need to stop taking them or reduce your dosage.   Do not take any new dietary supplements or medicines without asking your health care provider first.   Follow your health care provider's instructions about eating and drinking before the procedure. You may need to stop eating and drinking several hours before the procedure.   Arrange to have an adult drive you home after the procedure. PROCEDURE  You may need to remove your clothing and dress in an open-back gown so that your health care provider can access your spine.   The procedure will be done while you are lying on an X-ray table. Most of the time you will be asked to lie on your stomach, but you may be asked to lie in a different position if an injection will be made in your neck.   Special machines will be used to monitor your oxygen levels, heart rate, and blood pressure.   If an  injection will be made in your neck, an intravenous (IV) tube will be inserted into one of your veins. Fluids and medicine will flow directly into your body through the IV tube.   The area over the facet joint where the injection will be made will be cleaned with an antiseptic soap. The surrounding skin will be covered with sterile drapes.   An anesthetic will be applied to your skin to make the injection area numb. You may feel a temporary stinging or burning sensation.   A video X-ray machine will be used to locate the joint. A contrast dye may be injected into the facet joint area to help with locating the joint.   When the joint is located, an anesthetic medicine will be injected into the joint through the needle.   Your health care provider will ask you whether you feel pain relief. If you do feel relief,  a steroid may be injected to provide pain relief for a longer period of time. If you do not feel relief or feel only partial relief, additional injections of an anesthetic may be made in other facet joints.   The needle will be removed, the skin will be cleansed, and bandages will be applied.  AFTER THE PROCEDURE   You will be observed for 15-30 minutes before being allowed to go home. Do not drive. Have an adult drive you or take a taxi or public transportation instead.   If you feel pain relief, the pain will return in several hours or days when the anesthetic wears off.   You may feel pain relief 2-14 days after the procedure. The amount of time this relief lasts varies from person to person.   It is normal to feel some tenderness over the injected area(s) for 2 days following the procedure.   If you have diabetes, you may have a temporary increase in blood sugar. This information is not intended to replace advice given to you by your health care provider. Make sure you discuss any questions you have with your health care provider. Document Released: 10/30/2006 Document  Revised: 07/01/2014 Document Reviewed: 03/06/2015 Elsevier Interactive Patient Education  2017 [place] After Refer to this sheet in the next few weeks. These instructions provide you with information on caring for yourself after your procedure. Your health care provider may also give you more specific instructions. Your treatment has been planned according to current medical practices, but problems sometimes occur. Call your health care provider if you have any problems or questions after your procedure. HOME CARE INSTRUCTIONS   Keep track of the amount of pain relief you feel and how long it lasts.  Limit pain medicine within the first 4-6 hours after the procedure as directed by your health care provider.  Resume taking dietary supplements and medicines as directed by your health care provider.  You may resume your regular diet.  Do not apply heat near or over the injection site(s) for 24 hours.   Do not take a bath or soak in water (such as a pool or lake) for 24 hours.  Do not drive for 24 hours unless approved by your health care provider.  Avoid strenuous activity for 24 hours.  Remove your bandages the morning after the procedure.   If the injection site is tender, applying an ice pack may relieve some tenderness. To do this:  Put ice in a bag.  Place a towel between your skin and the bag.  Leave the ice on for 15-20 minutes, 3-4 times a day.  Keep follow-up appointments as directed by your health care provider. SEEK MEDICAL CARE IF:   Your pain is not controlled by your medicines.   There is drainage from the injection site.   There is significant bleeding or swelling at the injection site.  You have diabetes and your blood sugar is above 180 mg/dL. SEEK IMMEDIATE MEDICAL CARE IF:   You develop a fever of 101F (38.3C) or greater.   You have worsening pain or swelling around the injection site.   You have red streaking  around the injection site.   You develop severe pain that is not controlled by your medicines.   You develop a headache, stiff neck, nausea, or vomiting.   Your eyes become very sensitive to light.   You have weakness, paralysis, or tingling in your arms or legs that was not  present before the procedure.   You develop difficulty urinating or breathing.  This information is not intended to replace advice given to you by your health care provider. Make sure you discuss any questions you have with your health care provider. Document Released: 05/27/2012 Document Revised: 07/01/2014 Document Reviewed: 03/06/2015 Elsevier Interactive Patient Education  2017 [employer].

## 2016-08-06 ENCOUNTER — Emergency Department
Admission: EM | Admit: 2016-08-06 | Discharge: 2016-08-07 | Disposition: A | Discharge status: Home / Self Care | Payer: Medicaid Other | Attending: Emergency Medicine | Admitting: Emergency Medicine

## 2016-08-06 ENCOUNTER — Telehealth: Payer: Self-pay | Admitting: *Deleted

## 2016-08-06 DIAGNOSIS — J45909 Unspecified asthma, uncomplicated: Secondary | ICD-10-CM | POA: Diagnosis not present

## 2016-08-06 DIAGNOSIS — Z7982 Long term (current) use of aspirin: Secondary | ICD-10-CM | POA: Insufficient documentation

## 2016-08-06 DIAGNOSIS — I2510 Atherosclerotic heart disease of native coronary artery without angina pectoris: Secondary | ICD-10-CM | POA: Insufficient documentation

## 2016-08-06 DIAGNOSIS — I10 Essential (primary) hypertension: Secondary | ICD-10-CM | POA: Insufficient documentation

## 2016-08-06 DIAGNOSIS — F17210 Nicotine dependence, cigarettes, uncomplicated: Secondary | ICD-10-CM | POA: Diagnosis not present

## 2016-08-06 DIAGNOSIS — J449 Chronic obstructive pulmonary disease, unspecified: Secondary | ICD-10-CM | POA: Insufficient documentation

## 2016-08-06 DIAGNOSIS — K852 Alcohol induced acute pancreatitis without necrosis or infection: Secondary | ICD-10-CM | POA: Diagnosis not present

## 2016-08-06 DIAGNOSIS — R1010 Upper abdominal pain, unspecified: Secondary | ICD-10-CM | POA: Diagnosis present

## 2016-08-06 DIAGNOSIS — Z79899 Other long term (current) drug therapy: Secondary | ICD-10-CM | POA: Insufficient documentation

## 2016-08-06 LAB — CBC
HCT: 41.4 % (ref 35.0–47.0)
Hemoglobin: 14.2 g/dL (ref 12.0–16.0)
MCH: 31.2 pg (ref 26.0–34.0)
MCHC: 34.4 g/dL (ref 32.0–36.0)
MCV: 90.8 fL (ref 80.0–100.0)
Platelets: 295 10*3/uL (ref 150–440)
RBC: 4.56 MIL/uL (ref 3.80–5.20)
RDW: 15.8 % — ABNORMAL HIGH (ref 11.5–14.5)
WBC: 21.7 10*3/uL — ABNORMAL HIGH (ref 3.6–11.0)

## 2016-08-06 LAB — URINALYSIS, COMPLETE (UACMP) WITH MICROSCOPIC
Bilirubin Urine: NEGATIVE
Glucose, UA: NEGATIVE mg/dL
Hgb urine dipstick: NEGATIVE
Ketones, ur: NEGATIVE mg/dL
Leukocytes, UA: NEGATIVE
Nitrite: NEGATIVE
Protein, ur: 30 mg/dL — AB
Specific Gravity, Urine: 1.005 (ref 1.005–1.030)
pH: 6 (ref 5.0–8.0)

## 2016-08-06 LAB — COMPREHENSIVE METABOLIC PANEL
ALT: 129 U/L — ABNORMAL HIGH (ref 14–54)
AST: 87 U/L — ABNORMAL HIGH (ref 15–41)
Albumin: 4.4 g/dL (ref 3.5–5.0)
Alkaline Phosphatase: 67 U/L (ref 38–126)
Anion gap: 12 (ref 5–15)
BUN: 14 mg/dL (ref 6–20)
CO2: 18 mmol/L — ABNORMAL LOW (ref 22–32)
Calcium: 9.3 mg/dL (ref 8.9–10.3)
Chloride: 103 mmol/L (ref 101–111)
Creatinine, Ser: 0.95 mg/dL (ref 0.44–1.00)
GFR calc Af Amer: >60 mL/min (ref >60)
GFR calc non Af Amer: >60 mL/min (ref >60)
Glucose, Bld: 109 mg/dL — ABNORMAL HIGH (ref 65–99)
Potassium: 4.3 mmol/L (ref 3.5–5.1)
Sodium: 133 mmol/L — ABNORMAL LOW (ref 135–145)
Total Bilirubin: 0.7 mg/dL (ref 0.3–1.2)
Total Protein: 8.4 g/dL — ABNORMAL HIGH (ref 6.5–8.1)

## 2016-08-06 LAB — LIPASE, BLOOD: Lipase: 51 U/L (ref 11–51)

## 2016-08-06 NOTE — Telephone Encounter (Signed)
Denies any problems post procedure

## 2016-08-06 NOTE — ED Triage Notes (Signed)
Pt arrives via [facility] from home for L and R sided abdominal pain. Statse "it hurts where my pancreas and liver are." States pain x 2 days, worse today. States nausea, 1 episode vomiting, multiple episodes of diarrhea. Hx pancreatitis and liver disease. Pt had 3 beers (small cans) today, last at 6pm.   EMS VS 140/100, CBG 142, 98HR, 96% RA.

## 2016-08-07 ENCOUNTER — Emergency Department: Payer: Medicaid Other

## 2016-08-07 ENCOUNTER — Ambulatory Visit: Payer: Medicaid Other | Admitting: Gastroenterology

## 2016-08-07 MED ORDER — OXYCODONE-ACETAMINOPHEN 5-325 MG PO TABS: 1.0000 | ORAL_TABLET | ORAL | 0 refills | Status: DC | PRN

## 2016-08-07 MED ORDER — PROMETHAZINE HCL 25 MG/ML IJ SOLN
12.5000 mg | Freq: Once | INTRAMUSCULAR | Status: AC
  Administered 2016-08-07: 12.5 mg via INTRAVENOUS
  Filled 2016-08-07: qty 1

## 2016-08-07 MED ORDER — ONDANSETRON HCL 4 MG PO TABS: 4.0000 mg | ORAL_TABLET | Freq: Every day | ORAL | 1 refills | Status: DC | PRN

## 2016-08-07 MED ORDER — IOPAMIDOL (ISOVUE-300) INJECTION 61%
30.0000 mL | Freq: Once | INTRAVENOUS | Status: AC | PRN
  Administered 2016-08-07: 30 mL via ORAL

## 2016-08-07 MED ORDER — SODIUM CHLORIDE 0.9 % IV BOLUS (SEPSIS)
1000.0000 mL | Freq: Once | INTRAVENOUS | Status: AC
  Administered 2016-08-07: 1000 mL via INTRAVENOUS

## 2016-08-07 MED ORDER — IOPAMIDOL (ISOVUE-300) INJECTION 61%
100.0000 mL | Freq: Once | INTRAVENOUS | Status: AC | PRN
Start: 2016-08-07 — End: 2016-08-07
  Administered 2016-08-07: 100 mL via INTRAVENOUS

## 2016-08-07 MED ORDER — MORPHINE SULFATE (PF) 4 MG/ML IV SOLN
4.0000 mg | Freq: Once | INTRAVENOUS | Status: AC
  Administered 2016-08-07: 4 mg via INTRAVENOUS
  Filled 2016-08-07: qty 1

## 2016-08-07 NOTE — ED Notes (Signed)
Patient transported to CT 

## 2016-08-07 NOTE — ED Notes (Signed)
Pt states she is still having some pain. MD notified. No new orders at this time.

## 2016-08-07 NOTE — ED Notes (Signed)
Pt requests something to drink. MD notified, states pt can drink.

## 2016-08-07 NOTE — ED Provider Notes (Signed)
[facility] Emergency Department Provider Note    First MD Initiated Contact with Patient 08/07/16 0003     (approximate)  I have reviewed the triage vital signs and the nursing notes.   HISTORY  Chief Complaint Abdominal Pain    HPI Sara Munoz is a 51 y.o. female below list of chronic medical conditions presents with upper abdominal pain 2 days and is currently 10 out of 10. Patient states pain worsened this evening following drinking 3 cans of beer. Patient also admits to nausea no vomiting. Patient denies any fever diarrhea or constipation. Patient states that symptoms are consistent with previous episodes of pancreatitis.   Past Medical History:  Diagnosis Date  . Anxiety   . Asthma   . Atypical chest pain 08/02/2015  . CAD (coronary artery disease)   . Chronic back pain   . COPD (chronic obstructive pulmonary disease) ([place])   . Coronary artery disease    a. Mild-non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/2014  . GERD (gastroesophageal reflux disease)   . Hypercholesteremia   . Hypertension   . Hypokalemia   . Hypomagnesemia 01/04/2014  . Liver disease   . MI (myocardial infarction)   . Osteoarthritis     Patient Active Problem List   Diagnosis Date Noted  . Neurogenic pain 07/31/2016  . Chronic pain syndrome 06/05/2016  . Carotid artery stenosis 02/27/2016  . Chronic constipation 02/27/2016  . Chronic nausea 02/27/2016  . Coronary artery disease 02/27/2016  . Hypercholesterolemia 02/27/2016  . Osteoarthritis 02/27/2016  . PTSD (post-traumatic stress disorder) 02/27/2016  . TIA (transient ischemic attack) 02/27/2016  . Long term current use of opiate analgesic 02/27/2016  . Long term prescription opiate use 02/27/2016  . Opiate use 02/27/2016  . Encounter for therapeutic drug level monitoring 02/27/2016  . Encounter for pain management consult 02/27/2016  . Chronic hip pain (Location of Tertiary source of pain) (Bilateral) (L>R)  02/27/2016  . Chronic knee pain (Bilateral) (L>R) 02/27/2016  . Chronic shoulder pain (Bilateral) (L>R) 02/27/2016  . Chronic sacroiliac joint pain (Bilateral) (L>R) 02/27/2016  . Chronic low back pain (Location of Primary Source of Pain) (Bilateral) (L>R) 02/27/2016  . Chronic lower extremity pain (Location of Secondary source of pain) (Bilateral) (L>R) 02/27/2016  . Osteoarthritis of hip (Bilateral) (L>R) 02/27/2016  . Chronic neck pain (posterior midline) (Bilateral) (L>R) 02/27/2016  . Cervicogenic headache (Bilateral) (L>R) 02/27/2016  . Occipital headache (Bilateral) (L>R) 02/27/2016  . Chronic upper extremity pain (Bilateral) (L>R) 02/27/2016  . Chronic Cervical radicular pain (Bilateral) (L>R) 02/27/2016  . Chronic lumbar radicular pain (Right) (L5 dermatome) 02/27/2016  . Lumbar facet syndrome (Bilateral) (L>R) 02/27/2016  . Long term prescription benzodiazepine use 02/27/2016  . Hypertension   . COPD (chronic obstructive pulmonary disease) ([place]) 10/09/2015  . GERD (gastroesophageal reflux disease) 10/09/2015  . Non-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/2014 08/02/2015  . Hyperlipidemia 08/02/2015  . Costochondritis 08/02/2015  . 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 GAD (generalized anxiety disorder) 09/03/2014  . Narcotic dependence ([place]) 09/03/2014  . PVD (peripheral vascular disease) ([place]) 09/03/2014  . Smoker 09/03/2014  . Cigarette nicotine dependence with nicotine-induced disorder 07/08/2014  . Anemia of chronic disease 03/19/2014  . Narcotic abuse, continuous 03/19/2014  . Polysubstance abuse 03/19/2014  . Hypomagnesemia 01/04/2014  . Chronic cough 09/04/2011  . Sleep apnea, obstructive 09/04/2011    Past Surgical History:  Procedure Laterality Date  . CARDIAC CATHETERIZATION Left 02/22/2016   Procedure: Left Heart Cath and Coronary Angiography;  Surgeon: Yolonda Kida, MD;  Location: [place] [facility];  Service: Cardiovascular;  Laterality: Left;  . hemorroids    . ORIF  FEMUR FRACTURE    . ORIF TIBIA & FIBULA FRACTURES    . OVARY SURGERY      Prior to Admission medications   Medication Sig Start Date End Date Taking? Authorizing Provider  albuterol (PROVENTIL HFA;VENTOLIN HFA) 108 (90 Base) MCG/ACT inhaler Inhale 2 puffs into the lungs every 4 (four) hours as needed for wheezing or shortness of breath. 08/03/15   Demetrios Loll, MD  aspirin EC 81 MG tablet Take 81 mg by mouth daily.    Historical Provider, MD  atorvastatin (LIPITOR) 20 MG tablet Take 20 mg by mouth at bedtime.    Historical Provider, MD  budesonide-formoterol (SYMBICORT) 160-4.5 MCG/ACT inhaler Inhale 2 puffs into the lungs 2 (two) times daily. 05/31/15   Gregor Hams, MD  busPIRone (BUSPAR) 7.5 MG tablet Take 7.5 mg by mouth 3 (three) times daily.     Historical Provider, MD  dexlansoprazole (DEXILANT) 60 MG capsule Take 60 mg by mouth daily.    Historical Provider, MD  docusate sodium (COLACE) 100 MG capsule Take 200 mg by mouth 2 (two) times daily as needed for mild constipation.    Historical Provider, MD  gabapentin (NEURONTIN) 300 MG capsule Take 2-3 capsules (600-900 mg total) by mouth 3 (three) times daily. 07/31/16 10/29/16  Milinda Pointer, MD  linaclotide (LINZESS) 290 MCG CAPS capsule Take 290 mcg by mouth daily before breakfast.    Historical Provider, MD  lisinopril (PRINIVIL,ZESTRIL) 20 MG tablet Take 40 mg by mouth daily.     Historical Provider, MD  Magnesium Oxide 500 MG CAPS Take 1 capsule (500 mg total) by mouth 2 (two) times daily at 8 am and 10 pm. 04/24/16   Milinda Pointer, MD  meloxicam (MOBIC) 15 MG tablet Take 1 tablet (15 mg total) by mouth daily. 06/05/16 09/03/16  Milinda Pointer, MD  metoprolol tartrate (LOPRESSOR) 25 MG tablet Take 1 tablet (25 mg total) by mouth 2 (two) times daily. 08/03/15   Demetrios Loll, MD  nitroGLYCERIN (NITROSTAT) 0.4 MG SL tablet Place 1 tablet (0.4 mg total) under the tongue every 5 (five) minutes x 3 doses as needed for chest pain. 08/03/15   Demetrios Loll, MD  ondansetron (ZOFRAN ODT) 4 MG disintegrating tablet Take 1 tablet (4 mg total) by mouth every 8 (eight) hours as needed for nausea or vomiting. 06/13/16   Gregor Hams, MD  ondansetron (ZOFRAN) 4 MG tablet Take 1 tablet (4 mg total) by mouth daily as needed for nausea or vomiting. 08/07/16 08/07/17  Gregor Hams, MD  oxyCODONE-acetaminophen (ROXICET) 5-325 MG tablet Take 1 tablet by mouth every 4 (four) hours as needed for severe pain. 08/07/16   Gregor Hams, MD  potassium chloride (KLOR-CON) 20 MEQ packet Take 20 mEq by mouth 2 (two) times daily.     Historical Provider, MD  RANEXA 500 MG 12 hr tablet Take 1 tablet (500 mg total) by mouth 2 (two) times daily. 08/03/15   Demetrios Loll, MD  tiZANidine (ZANAFLEX) 4 MG tablet Take 1-2 tablets (4-8 mg total) by mouth every 6 (six) hours as needed for muscle spasms. 07/31/16 10/29/16  Milinda Pointer, MD    Allergies Levofloxacin; Flexeril [cyclobenzaprine]; Keflex [cephalexin]; Ketorolac; Toradol [ketorolac tromethamine]; Tramadol; and Zoloft [sertraline hcl]  Family History  Problem Relation Age of Onset  . Heart disease Mother   . Lung  cancer Mother   . Ovarian cancer Mother     Social History Social History  Substance Use Topics  . Smoking status: Current Every Day Smoker    Packs/day: 0.50    Types: Cigarettes  . Smokeless tobacco: Never Used  . Alcohol use 0.0 oz/week     Comment: drinks occasionally    Review of Systems Constitutional: No fever/chills Eyes: No visual changes. ENT: No sore throat. Cardiovascular: Denies chest pain. Respiratory: Denies shortness of breath. Gastrointestinal:Positive for abdominal pain and nausea.   no vomiting.  No diarrhea.  No constipation. Genitourinary: Negative for dysuria. Musculoskeletal: Negative for back pain. Skin: Negative for rash. Neurological: Negative for headaches, focal weakness or numbness.  10-point ROS otherwise  negative.  ____________________________________________   PHYSICAL EXAM:  VITAL SIGNS: ED Triage Vitals [08/06/16 2251]  Enc Vitals Group     BP (!) 153/102     Pulse Rate (!) 104     Resp (!) 22     Temp 98 F (36.7 C)     Temp Source Oral     SpO2 94 %     Weight 190 lb (86.2 kg)     Height 5\' 4"  (1.626 m)     Head Circumference      Peak Flow      Pain Score 10     Pain Loc      Pain Edu?      Excl. in [place]?     Constitutional: Alert and oriented. Apparent discomfort Eyes: Conjunctivae are normal. PERRL. EOMI. Head: Atraumatic. Mouth/Throat: Mucous membranes are moist. Neck: No stridor.   Cardiovascular: Normal rate, regular rhythm. Good peripheral circulation. Grossly normal heart sounds. Respiratory: Normal respiratory effort.  No retractions. Lungs CTAB. Gastrointestinal: Right upper quadrant epigastric and left upper quadrant tenderness to palpation.. No distention.  Musculoskeletal: No lower extremity tenderness nor edema. No gross deformities of extremities. Neurologic:  Normal speech and language. No gross focal neurologic deficits are appreciated.  Skin:  Skin is warm, dry and intact. No rash noted. Psychiatric: Mood and affect are normal. Speech and behavior are normal.  ____________________________________________   LABS (all labs ordered are listed, but only abnormal results are displayed)  Labs Reviewed  COMPREHENSIVE METABOLIC PANEL - Abnormal; Notable for the following:       Result Value   Sodium 133 (*)    CO2 18 (*)    Glucose, Bld 109 (*)    Total Protein 8.4 (*)    AST 87 (*)    ALT 129 (*)    All other components within normal limits  CBC - Abnormal; Notable for the following:    WBC 21.7 (*)    RDW 15.8 (*)    All other components within normal limits  URINALYSIS, COMPLETE (UACMP) WITH MICROSCOPIC - Abnormal; Notable for the following:    Color, Urine STRAW (*)    APPearance CLEAR (*)    Protein, ur 30 (*)    Bacteria, UA RARE (*)     Squamous Epithelial / LPF 0-5 (*)    All other components within normal limits  LIPASE, BLOOD     RADIOLOGY I, [place] Alexa, personally viewed and evaluated these images (plain radiographs) as part of my medical decision making, as well as reviewing the written report by the radiologist.  Ct Abdomen Pelvis W Contrast  Result Date: 08/07/2016 CLINICAL DATA:  Acute onset of bilateral upper quadrant abdominal pain. Nausea and vomiting. Diarrhea. Initial encounter. EXAM: CT ABDOMEN AND PELVIS  WITH CONTRAST TECHNIQUE: Multidetector CT imaging of the abdomen and pelvis was performed using the standard protocol following bolus administration of intravenous contrast. CONTRAST:  136mL ISOVUE-300 IOPAMIDOL (ISOVUE-300) INJECTION 61% COMPARISON:  CT of the abdomen and pelvis from 06/13/2016 FINDINGS: Lower chest: Minimal bibasilar atelectasis is noted. The visualized portions of the mediastinum are unremarkable. Hepatobiliary: There is diffuse fatty infiltration within the liver. The gallbladder is unremarkable in appearance. The common bile duct remains normal in caliber. Pancreas: The pancreas is within normal limits. Spleen: The spleen is unremarkable in appearance. Adrenals/Urinary Tract: The adrenal glands are unremarkable in appearance. The kidneys are within normal limits. There is no evidence of hydronephrosis. No renal or ureteral stones are identified. No perinephric stranding is seen. Stomach/Bowel: The stomach is unremarkable in appearance. The small bowel is within normal limits. The appendix is normal in caliber, without evidence of appendicitis. The colon is unremarkable in appearance. Vascular/Lymphatic: Scattered calcification is seen along the abdominal aorta and its branches. The abdominal aorta is otherwise grossly unremarkable. The inferior vena cava is grossly unremarkable. No retroperitoneal lymphadenopathy is seen. No pelvic sidewall lymphadenopathy is identified. Reproductive: The  bladder is mildly distended and within normal limits. The uterus is grossly unremarkable in appearance. The ovaries are relatively symmetric. No suspicious adnexal masses are seen. Other: No additional soft tissue abnormalities are seen. Musculoskeletal: No acute osseous abnormalities are identified. A left femoral intramedullary rod is partially imaged. The visualized musculature is unremarkable in appearance. IMPRESSION: 1. No acute abnormality seen to explain the patient's symptoms. 2. Diffuse fatty infiltration within the liver. 3. Scattered aortic atherosclerosis. 4. Minimal bibasilar atelectasis. Electronically Signed   By: Garald Balding M.D.   On: 08/07/2016 01:55      Procedures     INITIAL IMPRESSION / ASSESSMENT AND PLAN / ED COURSE  Pertinent labs & imaging results that were available during my care of the patient were reviewed by me and considered in my medical decision making (see chart for details).  Patient received IV morphine and Zofran emergency Department with improvement of pain spoke with patient at length regarding diagnosis of pancreatitis and necessity of avoiding alcohol      ____________________________________________  FINAL CLINICAL IMPRESSION(S) / ED DIAGNOSES  Final diagnoses:  Alcohol-induced acute pancreatitis without infection or necrosis     MEDICATIONS GIVEN DURING THIS VISIT:  Medications  morphine 4 MG/ML injection 4 mg (4 mg Intravenous Given 08/07/16 0013)  promethazine (PHENERGAN) injection 12.5 mg (12.5 mg Intravenous Given 08/07/16 0025)  sodium chloride 0.9 % bolus 1,000 mL (0 mLs Intravenous Stopped 08/07/16 0343)  iopamidol (ISOVUE-300) 61 % injection 30 mL (30 mLs Oral Contrast Given 08/07/16 0112)  iopamidol (ISOVUE-300) 61 % injection 100 mL (100 mLs Intravenous Contrast Given 08/07/16 0122)     NEW OUTPATIENT MEDICATIONS STARTED DURING THIS VISIT:  Discharge Medication List as of 08/07/2016  3:44 AM    START taking these  medications   Details  ondansetron (ZOFRAN) 4 MG tablet Take 1 tablet (4 mg total) by mouth daily as needed for nausea or vomiting., Starting Wed 08/07/2016, Until Thu 08/07/2017, Print    oxyCODONE-acetaminophen (ROXICET) 5-325 MG tablet Take 1 tablet by mouth every 4 (four) hours as needed for severe pain., Starting Wed 08/07/2016, Print        Discharge Medication List as of 08/07/2016  3:44 AM      Discharge Medication List as of 08/07/2016  3:44 AM       Note:  This document was  prepared using Systems analyst and may include unintentional dictation errors.    Gregor Hams, MD 08/07/16 0730

## 2016-08-22 ENCOUNTER — Other Ambulatory Visit
Admission: RE | Admit: 2016-08-22 | Discharge: 2016-08-22 | Disposition: A | Discharge status: Home / Self Care | Payer: Medicaid Other | Source: Ambulatory Visit | Attending: Gastroenterology | Admitting: Gastroenterology

## 2016-08-22 DIAGNOSIS — R7989 Other specified abnormal findings of blood chemistry: Secondary | ICD-10-CM | POA: Insufficient documentation

## 2016-08-22 DIAGNOSIS — J40 Bronchitis, not specified as acute or chronic: Secondary | ICD-10-CM | POA: Diagnosis not present

## 2016-08-22 LAB — URINE DRUG SCREEN, QUALITATIVE (ARMC ONLY)
Amphetamines, Ur Screen: NOT DETECTED
Barbiturates, Ur Screen: NOT DETECTED
Benzodiazepine, Ur Scrn: NOT DETECTED
Cannabinoid 50 Ng, Ur ~~LOC~~: NOT DETECTED
Cocaine Metabolite,Ur ~~LOC~~: NOT DETECTED
MDMA (Ecstasy)Ur Screen: NOT DETECTED
Methadone Scn, Ur: NOT DETECTED
Opiate, Ur Screen: NOT DETECTED
Phencyclidine (PCP) Ur S: NOT DETECTED
Tricyclic, Ur Screen: NOT DETECTED

## 2016-08-22 LAB — BASIC METABOLIC PANEL
Anion gap: 6 (ref 5–15)
BUN: 21 mg/dL — ABNORMAL HIGH (ref 6–20)
CO2: 23 mmol/L (ref 22–32)
Calcium: 8.9 mg/dL (ref 8.9–10.3)
Chloride: 105 mmol/L (ref 101–111)
Creatinine, Ser: 1.25 mg/dL — ABNORMAL HIGH (ref 0.44–1.00)
GFR calc Af Amer: 58 mL/min — ABNORMAL LOW (ref >60)
GFR calc non Af Amer: 50 mL/min — ABNORMAL LOW (ref >60)
Glucose, Bld: 86 mg/dL (ref 65–99)
Potassium: 4.5 mmol/L (ref 3.5–5.1)
Sodium: 134 mmol/L — ABNORMAL LOW (ref 135–145)

## 2016-08-22 LAB — PROTIME-INR
INR: 0.89
Prothrombin Time: 12 seconds (ref 11.4–15.2)

## 2016-08-22 LAB — CBC WITH DIFFERENTIAL/PLATELET
Basophils Absolute: 0.1 10*3/uL (ref 0–0.1)
Basophils Relative: 1 %
Eosinophils Absolute: 0.2 10*3/uL (ref 0–0.7)
Eosinophils Relative: 2 %
HCT: 39.3 % (ref 35.0–47.0)
Hemoglobin: 13.6 g/dL (ref 12.0–16.0)
Lymphocytes Relative: 15 %
Lymphs Abs: 1.5 10*3/uL (ref 1.0–3.6)
MCH: 31.9 pg (ref 26.0–34.0)
MCHC: 34.6 g/dL (ref 32.0–36.0)
MCV: 92.1 fL (ref 80.0–100.0)
Monocytes Absolute: 0.8 10*3/uL (ref 0.2–0.9)
Monocytes Relative: 7 %
Neutro Abs: 7.7 10*3/uL — ABNORMAL HIGH (ref 1.4–6.5)
Neutrophils Relative %: 75 %
Platelets: 211 10*3/uL (ref 150–440)
RBC: 4.26 MIL/uL (ref 3.80–5.20)
RDW: 16 % — ABNORMAL HIGH (ref 11.5–14.5)
WBC: 10.2 10*3/uL (ref 3.6–11.0)

## 2016-08-22 LAB — HEPATIC FUNCTION PANEL
ALT: 101 U/L — ABNORMAL HIGH (ref 14–54)
AST: 95 U/L — ABNORMAL HIGH (ref 15–41)
Albumin: 4.2 g/dL (ref 3.5–5.0)
Alkaline Phosphatase: 70 U/L (ref 38–126)
Bilirubin, Direct: <0.1 mg/dL — ABNORMAL LOW (ref 0.1–0.5)
Total Bilirubin: 0.4 mg/dL (ref 0.3–1.2)
Total Protein: 7.8 g/dL (ref 6.5–8.1)

## 2016-08-22 LAB — TSH: TSH: 2.166 u[IU]/mL (ref 0.350–4.500)

## 2016-08-23 LAB — CERULOPLASMIN: Ceruloplasmin: 27.8 mg/dL (ref 19.0–39.0)

## 2016-08-23 LAB — MITOCHONDRIAL ANTIBODIES: Mitochondrial M2 Ab, IgG: 13.6 Units (ref 0.0–20.0)

## 2016-08-23 LAB — HEPATITIS B SURFACE ANTIGEN: Hepatitis B Surface Ag: NEGATIVE

## 2016-08-23 LAB — HEPATITIS A ANTIBODY, TOTAL: Hep A Total Ab: NEGATIVE

## 2016-08-23 LAB — HEPATITIS B E ANTIBODY: Hep B E Ab: NEGATIVE

## 2016-08-23 LAB — HEPATITIS C ANTIBODY: HCV Ab: >11 s/co ratio — ABNORMAL HIGH (ref 0.0–0.9)

## 2016-08-23 LAB — HIV ANTIBODY (ROUTINE TESTING W REFLEX): HIV Screen 4th Generation wRfx: NONREACTIVE

## 2016-08-23 LAB — HEPATITIS B CORE ANTIBODY, IGM: Hep B C IgM: NEGATIVE

## 2016-08-23 LAB — ANTI-SMOOTH MUSCLE ANTIBODY, IGG: F-Actin IgG: 8 Units (ref 0–19)

## 2016-08-23 LAB — HEPATITIS B E ANTIGEN: Hep B E Ag: NEGATIVE

## 2016-08-24 LAB — HEPATITIS C GENOTYPE: HCV Genotype: 3

## 2016-08-24 LAB — HEPATITIS C VRS RNA DETECT BY PCR-QUAL: Hepatitis C Vrs RNA by PCR-Qual: POSITIVE — AB

## 2016-08-25 LAB — CELIAC DISEASE PANEL
Endomysial Ab, IgA: NEGATIVE
IgA: 265 mg/dL (ref 87–352)
Tissue Transglutaminase Ab, IgA: <2 U/mL (ref 0–3)

## 2016-08-26 LAB — ALPHA-1 ANTITRYPSIN PHENOTYPE: A-1 Antitrypsin, Ser: 114 mg/dL (ref 90–200)

## 2016-08-26 LAB — ANTI-MICROSOMAL ANTIBODY LIVER / KIDNEY: LKM1 Ab: 2.4 Units (ref 0.0–20.0)

## 2016-08-27 LAB — MISC LABCORP TEST (SEND OUT): Labcorp test code: 164010

## 2016-08-28 ENCOUNTER — Encounter: Payer: Self-pay | Admitting: Gastroenterology

## 2016-08-28 ENCOUNTER — Telehealth: Payer: Self-pay

## 2016-08-28 ENCOUNTER — Other Ambulatory Visit: Payer: Self-pay

## 2016-08-28 ENCOUNTER — Ambulatory Visit (INDEPENDENT_AMBULATORY_CARE_PROVIDER_SITE_OTHER): Payer: Medicaid Other | Admitting: Gastroenterology

## 2016-08-28 VITALS — BP 112/52 | Ht 64.0 in | Wt 201.8 lb

## 2016-08-28 DIAGNOSIS — F1010 Alcohol abuse, uncomplicated: Secondary | ICD-10-CM | POA: Diagnosis not present

## 2016-08-28 DIAGNOSIS — B182 Chronic viral hepatitis C: Secondary | ICD-10-CM

## 2016-08-28 DIAGNOSIS — K625 Hemorrhage of anus and rectum: Secondary | ICD-10-CM | POA: Diagnosis not present

## 2016-08-28 DIAGNOSIS — K649 Unspecified hemorrhoids: Secondary | ICD-10-CM

## 2016-08-28 MED ORDER — HYDROCORTISONE 2.5 % RE CREA: 1.0000 "application " | TOPICAL_CREAM | Freq: Every morning | RECTAL | 1 refills | Status: AC

## 2016-08-28 NOTE — Telephone Encounter (Signed)
Gastroenterology Pre-Procedure Review  Request Date: 09/03/16 Requesting Physician: Dr. Vicente Males  PATIENT REVIEW QUESTIONS: The patient responded to the following health history questions as indicated:    1. Are you having any GI issues? yes (hemorrhoids) 2. Do you have a personal history of Polyps? yes (removed) 3. Do you have a family history of Colon Cancer or Polyps? yes (uncle colon cancer) 4. Diabetes Mellitus? no 5. Joint replacements in the past 12 months?no 6. Major health problems in the past 3 months?no 7. Any artificial heart valves, MVP, or defibrillator?no    MEDICATIONS & ALLERGIES:    Patient reports the following regarding taking any anticoagulation/antiplatelet therapy:   Plavix, Coumadin, Eliquis, Xarelto, Lovenox, Pradaxa, Brilinta, or Effient? no Aspirin? yes (81mg )  Patient confirms/reports the following medications:  Current Outpatient Prescriptions  Medication Sig Dispense Refill  . albuterol (PROVENTIL HFA;VENTOLIN HFA) 108 (90 Base) MCG/ACT inhaler Inhale 2 puffs into the lungs every 4 (four) hours as needed for wheezing or shortness of breath. 1 Inhaler 0  . aspirin EC 81 MG tablet Take 81 mg by mouth daily.    Marland Kitchen atorvastatin (LIPITOR) 20 MG tablet Take 20 mg by mouth at bedtime.    . budesonide-formoterol (SYMBICORT) 160-4.5 MCG/ACT inhaler Inhale 2 puffs into the lungs 2 (two) times daily. 1 Inhaler 0  . busPIRone (BUSPAR) 7.5 MG tablet Take 7.5 mg by mouth 3 (three) times daily.     . busPIRone (BUSPAR) 7.5 MG tablet Take by mouth.    . dexlansoprazole (DEXILANT) 60 MG capsule Take 60 mg by mouth daily.    Marland Kitchen docusate sodium (COLACE) 100 MG capsule Take 200 mg by mouth 2 (two) times daily as needed for mild constipation.    Marland Kitchen doxycycline (MONODOX) 100 MG capsule Take by mouth.    . gabapentin (NEURONTIN) 300 MG capsule Take 2-3 capsules (600-900 mg total) by mouth 3 (three) times daily. 270 capsule 2  . HYDROcodone-homatropine (HYCODAN) 5-1.5 MG/5ML syrup Take  by mouth.    . hydrocortisone (ANUSOL-HC) 2.5 % rectal cream Place 1 application rectally every morning. 30 g 1  . linaclotide (LINZESS) 290 MCG CAPS capsule Take 290 mcg by mouth daily before breakfast.    . lisinopril (PRINIVIL,ZESTRIL) 20 MG tablet Take 40 mg by mouth daily.     . Magnesium Oxide 500 MG CAPS Take 1 capsule (500 mg total) by mouth 2 (two) times daily at 8 am and 10 pm. 100 capsule PRN  . meloxicam (MOBIC) 15 MG tablet Take 1 tablet (15 mg total) by mouth daily. 30 tablet 2  . metoprolol tartrate (LOPRESSOR) 25 MG tablet Take 1 tablet (25 mg total) by mouth 2 (two) times daily. 60 tablet 2  . nitroGLYCERIN (NITROSTAT) 0.4 MG SL tablet Place 1 tablet (0.4 mg total) under the tongue every 5 (five) minutes x 3 doses as needed for chest pain. 30 tablet 2  . ondansetron (ZOFRAN ODT) 4 MG disintegrating tablet Take 1 tablet (4 mg total) by mouth every 8 (eight) hours as needed for nausea or vomiting. 20 tablet 0  . ondansetron (ZOFRAN) 4 MG tablet Take 1 tablet (4 mg total) by mouth daily as needed for nausea or vomiting. 30 tablet 1  . oxyCODONE-acetaminophen (ROXICET) 5-325 MG tablet Take 1 tablet by mouth every 4 (four) hours as needed for severe pain. (Patient not taking: Reported on 08/28/2016) 20 tablet 0  . potassium chloride (KLOR-CON) 20 MEQ packet Take 20 mEq by mouth 2 (two) times daily.     Marland Kitchen  RANEXA 500 MG 12 hr tablet Take 1 tablet (500 mg total) by mouth 2 (two) times daily. 60 tablet 2  . tiZANidine (ZANAFLEX) 4 MG tablet Take 1-2 tablets (4-8 mg total) by mouth every 6 (six) hours as needed for muscle spasms. 240 tablet 0   Current Facility-Administered Medications  Medication Dose Route Frequency Provider Last Rate Last Dose  . fentaNYL (SUBLIMAZE) injection 25-50 mcg  25-50 mcg Intravenous Q5 min PRN Milinda Pointer, MD      . lactated ringers infusion 1,000 mL  1,000 mL Intravenous Once Milinda Pointer, MD      . methylPREDNISolone acetate (DEPO-MEDROL) injection  80 mg  80 mg Intra-articular Once Milinda Pointer, MD      . midazolam (VERSED) 5 MG/5ML injection 1-2 mg  1-2 mg Intravenous Q5 min PRN Milinda Pointer, MD      . ropivacaine (PF) 2 mg/mL (0.2%) (NAROPIN) injection 4 mL  4 mL Intra-articular Once Milinda Pointer, MD      . ropivacaine (PF) 2 mg/mL (0.2%) (NAROPIN) injection 9 mL  9 mL Peri-NEURAL Once Milinda Pointer, MD      . ropivacaine (PF) 2 mg/mL (0.2%) (NAROPIN) injection 9 mL  9 mL Peri-NEURAL Once Milinda Pointer, MD      . triamcinolone acetonide (KENALOG-40) injection 40 mg  40 mg Other Once Milinda Pointer, MD      . triamcinolone acetonide (KENALOG-40) injection 40 mg  40 mg Other Once Milinda Pointer, MD        Patient confirms/reports the following allergies:  Allergies  Allergen Reactions  . Levofloxacin Hives and Swelling  . Flexeril [Cyclobenzaprine] Hives, Swelling and Other (See Comments)    Reaction:  Facial/lip swelling  Reaction:  Facial/lip swelling   . Keflex [Cephalexin] Hives  . Ketorolac   . Toradol [Ketorolac Tromethamine] Swelling and Other (See Comments)    Reaction:  Facial/tongue swelling  Reaction:  Facial/tongue swelling   . Tramadol Hives, Swelling and Other (See Comments)    Reaction:  Lip swelling  Reaction:  Lip swelling   . Zoloft [Sertraline Hcl] Swelling and Other (See Comments)    Reaction:  Tongue swelling  Reaction:  Tongue swelling     No orders of the defined types were placed in this encounter.   AUTHORIZATION INFORMATION Primary Insurance: 1D#: Group #:  Secondary Insurance: 1D#: Group #:  SCHEDULE INFORMATION: Date: 09/03/16 Time: Location: [place]

## 2016-08-28 NOTE — Progress Notes (Signed)
Primary Care Physician: [place]  Primary Gastroenterologist:  Dr. [name]   No chief complaint on file.   HPI: Sara Munoz is a 51 y.o. female .  She is here for follow up.She was last seen in 05/2016  Summary of history :  [name] was initially referred back in 05/2016 for hepatitis C . She has not received any treatment previously. She said that her step brother had hepatitis C. She says she was a "big drinker" in the past and consumed a 12 pack twice a week for about "all my life".  At her last visit she continued to consume 1 case of alcohol a week . She denies any tatoos., no incarceration or Armed forces logistics/support/administrative officer. She used heroine last July , non since. Last used cocaine last year. No Armed forces logistics/support/administrative officer. Denies use of any herbal medications.   RUQ USG 05/07/16 showed hepatic steatosis.   Interval history 05/2016-08/2016  She was seen in the ER on 08/06/16 when she presented with abdominal pain of 2 days duration .CT abdomen was negative for pancreatitis.  Lipase was normal . She was given morphine and discharged.   Labs 08/2016 : TSH-Normal  HCV antibody positive HbsAg,Hep BcIGM,Hep BeAb/ag,HIV celiac serology,AMA,F actin -negative Ceruloplasmin,A1AT,LKMAb-negative  HCV RNA GT 3 AST 95, ALT 101,Hb 13.6.INR 0.89  Still drinking over the weekends 18 pack .  She say she has had rectal bleeding for a couple of months , blood on toilet paper and on the stool. Her uncle had colon cancer. She has a bowel movement daily which is not hard.    BP (!) 112/52   Ht 5\' 4"  (1.626 m)   Wt 201 lb 12.8 oz (91.5 kg)   BMI 34.64 kg/m   Current Outpatient Prescriptions  Medication Sig Dispense Refill  . albuterol (PROVENTIL HFA;VENTOLIN HFA) 108 (90 Base) MCG/ACT inhaler Inhale 2 puffs into the lungs every 4 (four) hours as needed for wheezing or shortness of breath. 1 Inhaler 0  . aspirin EC 81 MG tablet Take 81 mg by mouth daily.    Marland Kitchen atorvastatin (LIPITOR)  20 MG tablet Take 20 mg by mouth at bedtime.    . budesonide-formoterol (SYMBICORT) 160-4.5 MCG/ACT inhaler Inhale 2 puffs into the lungs 2 (two) times daily. 1 Inhaler 0  . busPIRone (BUSPAR) 7.5 MG tablet Take 7.5 mg by mouth 3 (three) times daily.     Marland Kitchen dexlansoprazole (DEXILANT) 60 MG capsule Take 60 mg by mouth daily.    Marland Kitchen docusate sodium (COLACE) 100 MG capsule Take 200 mg by mouth 2 (two) times daily as needed for mild constipation.    . gabapentin (NEURONTIN) 300 MG capsule Take 2-3 capsules (600-900 mg total) by mouth 3 (three) times daily. 270 capsule 2  . linaclotide (LINZESS) 290 MCG CAPS capsule Take 290 mcg by mouth daily before breakfast.    . lisinopril (PRINIVIL,ZESTRIL) 20 MG tablet Take 40 mg by mouth daily.     . Magnesium Oxide 500 MG CAPS Take 1 capsule (500 mg total) by mouth 2 (two) times daily at 8 am and 10 pm. 100 capsule PRN  . meloxicam (MOBIC) 15 MG tablet Take 1 tablet (15 mg total) by mouth daily. 30 tablet 2  . metoprolol tartrate (LOPRESSOR) 25 MG tablet Take 1 tablet (25 mg total) by mouth 2 (two) times daily. 60 tablet 2  . nitroGLYCERIN (NITROSTAT) 0.4 MG SL tablet Place 1 tablet (0.4 mg total) under the tongue every 5 (five)  minutes x 3 doses as needed for chest pain. 30 tablet 2  . ondansetron (ZOFRAN ODT) 4 MG disintegrating tablet Take 1 tablet (4 mg total) by mouth every 8 (eight) hours as needed for nausea or vomiting. 20 tablet 0  . ondansetron (ZOFRAN) 4 MG tablet Take 1 tablet (4 mg total) by mouth daily as needed for nausea or vomiting. 30 tablet 1  . oxyCODONE-acetaminophen (ROXICET) 5-325 MG tablet Take 1 tablet by mouth every 4 (four) hours as needed for severe pain. 20 tablet 0  . potassium chloride (KLOR-CON) 20 MEQ packet Take 20 mEq by mouth 2 (two) times daily.     Marland Kitchen RANEXA 500 MG 12 hr tablet Take 1 tablet (500 mg total) by mouth 2 (two) times daily. 60 tablet 2  . tiZANidine (ZANAFLEX) 4 MG tablet Take 1-2 tablets (4-8 mg total) by mouth every  6 (six) hours as needed for muscle spasms. 240 tablet 0   Current Facility-Administered Medications  Medication Dose Route Frequency Provider Last Rate Last Dose  . fentaNYL (SUBLIMAZE) injection 25-50 mcg  25-50 mcg Intravenous Q5 min PRN Milinda Pointer, MD      . lactated ringers infusion 1,000 mL  1,000 mL Intravenous Once Milinda Pointer, MD      . methylPREDNISolone acetate (DEPO-MEDROL) injection 80 mg  80 mg Intra-articular Once Milinda Pointer, MD      . midazolam (VERSED) 5 MG/5ML injection 1-2 mg  1-2 mg Intravenous Q5 min PRN Milinda Pointer, MD      . ropivacaine (PF) 2 mg/mL (0.2%) (NAROPIN) injection 4 mL  4 mL Intra-articular Once Milinda Pointer, MD      . ropivacaine (PF) 2 mg/mL (0.2%) (NAROPIN) injection 9 mL  9 mL Peri-NEURAL Once Milinda Pointer, MD      . ropivacaine (PF) 2 mg/mL (0.2%) (NAROPIN) injection 9 mL  9 mL Peri-NEURAL Once Milinda Pointer, MD      . triamcinolone acetonide (KENALOG-40) injection 40 mg  40 mg Other Once Milinda Pointer, MD      . triamcinolone acetonide (KENALOG-40) injection 40 mg  40 mg Other Once Milinda Pointer, MD        Allergies as of 08/28/2016 - Review Complete 08/07/2016  Allergen Reaction Noted  . Levofloxacin Hives and Swelling 02/27/2012  . Flexeril [cyclobenzaprine] Hives, Swelling, and Other (See Comments) 11/13/2013  . Keflex [cephalexin] Hives 10/22/2010  . Ketorolac  10/22/2010  . Toradol [ketorolac tromethamine] Swelling and Other (See Comments) 01/17/2015  . Tramadol Hives, Swelling, and Other (See Comments) 01/17/2015  . Zoloft [sertraline hcl] Swelling and Other (See Comments) 05/31/2015    ROS:  General: Negative for anorexia, weight loss, fever, chills, fatigue, weakness. ENT: Negative for hoarseness, difficulty swallowing , nasal congestion. CV: Negative for chest pain, angina, palpitations, dyspnea on exertion, peripheral edema.  Respiratory: Negative for dyspnea at rest, dyspnea on exertion,  cough, sputum, wheezing.  GI: See history of present illness. GU:  Negative for dysuria, hematuria, urinary incontinence, urinary frequency, nocturnal urination.  Endo: Negative for unusual weight change.    Physical Examination:   There were no vitals taken for this visit.  General: Well-nourished, well-developed in no acute distress.  Eyes: No icterus. Conjunctivae pink. Mouth: Oropharyngeal mucosa moist and pink , no lesions erythema or exudate. Lungs: Clear to auscultation bilaterally. Non-labored. Heart: Regular rate and rhythm, no murmurs rubs or gallops.  Abdomen: Bowel sounds are normal, nontender, nondistended, no hepatosplenomegaly or masses, no abdominal bruits or hernia , no rebound or guarding.  Extremities: No lower extremity edema. No clubbing or deformities. Neuro: Alert and oriented x 3.  Grossly intact. Skin: Warm and dry, no jaundice.   Psych: Alert and cooperative, normal mood and affect.    Imaging Studies: Ct Abdomen Pelvis W Contrast  Result Date: 08/07/2016 CLINICAL DATA:  Acute onset of bilateral upper quadrant abdominal pain. Nausea and vomiting. Diarrhea. Initial encounter. EXAM: CT ABDOMEN AND PELVIS WITH CONTRAST TECHNIQUE: Multidetector CT imaging of the abdomen and pelvis was performed using the standard protocol following bolus administration of intravenous contrast. CONTRAST:  162mL ISOVUE-300 IOPAMIDOL (ISOVUE-300) INJECTION 61% COMPARISON:  CT of the abdomen and pelvis from 06/13/2016 FINDINGS: Lower chest: Minimal bibasilar atelectasis is noted. The visualized portions of the mediastinum are unremarkable. Hepatobiliary: There is diffuse fatty infiltration within the liver. The gallbladder is unremarkable in appearance. The common bile duct remains normal in caliber. Pancreas: The pancreas is within normal limits. Spleen: The spleen is unremarkable in appearance. Adrenals/Urinary Tract: The adrenal glands are unremarkable in appearance. The kidneys are  within normal limits. There is no evidence of hydronephrosis. No renal or ureteral stones are identified. No perinephric stranding is seen. Stomach/Bowel: The stomach is unremarkable in appearance. The small bowel is within normal limits. The appendix is normal in caliber, without evidence of appendicitis. The colon is unremarkable in appearance. Vascular/Lymphatic: Scattered calcification is seen along the abdominal aorta and its branches. The abdominal aorta is otherwise grossly unremarkable. The inferior vena cava is grossly unremarkable. No retroperitoneal lymphadenopathy is seen. No pelvic sidewall lymphadenopathy is identified. Reproductive: The bladder is mildly distended and within normal limits. The uterus is grossly unremarkable in appearance. The ovaries are relatively symmetric. No suspicious adnexal masses are seen. Other: No additional soft tissue abnormalities are seen. Musculoskeletal: No acute osseous abnormalities are identified. A left femoral intramedullary rod is partially imaged. The visualized musculature is unremarkable in appearance. IMPRESSION: 1. No acute abnormality seen to explain the patient's symptoms. 2. Diffuse fatty infiltration within the liver. 3. Scattered aortic atherosclerosis. 4. Minimal bibasilar atelectasis. Electronically Signed   By: Garald Balding M.D.   On: 08/07/2016 01:55   Dg C-arm 1-60 Min-no Report  Result Date: 08/05/2016 Fluoroscopy was utilized by the requesting physician.  No radiographic interpretation.    Assessment and Plan:  RESHA FILIPPONE is a 51 y.o. y/o female has been referred for hepatitis C. GT 3 . Autoimmune screen is negative, she does not have hepatitis B/HIV. Transaminases are elevated. I suggest her to stop all alcohol and smoking . No biochemical or radiological evidence of cirrhosis. She would be a candidate for treatment for hepatitis C once she stops consuming alcohol. Her rectal bleeding needs evaluation with a colonoscopy .   Plan    1. Stop all alcohol 2. Colonoscopy for rectal bleeding  3. Anusol for 7 days.   F/u in  4 months  Dr [name]  MD

## 2016-09-02 ENCOUNTER — Telehealth: Payer: Self-pay | Admitting: Gastroenterology

## 2016-09-02 NOTE — Telephone Encounter (Signed)
Patient called to cancel her colonoscopy tomorrow and would like you to call her and reschedule.  I called [place]

## 2016-09-03 ENCOUNTER — Other Ambulatory Visit: Payer: Self-pay

## 2016-09-03 ENCOUNTER — Encounter: Admission: RE | Payer: Self-pay | Source: Ambulatory Visit

## 2016-09-03 ENCOUNTER — Ambulatory Visit: Admission: RE | Admit: 2016-09-03 | Payer: Medicaid Other | Source: Ambulatory Visit | Admitting: Gastroenterology

## 2016-09-03 DIAGNOSIS — K625 Hemorrhage of anus and rectum: Secondary | ICD-10-CM

## 2016-09-03 SURGERY — COLONOSCOPY WITH PROPOFOL
Anesthesia: General

## 2016-09-03 NOTE — Telephone Encounter (Signed)
Pt rescheduled for a colonoscopy at [facility] on 09/17/16 with Vicente Males.   Rectal bleeding K62.5

## 2016-09-04 ENCOUNTER — Telehealth: Payer: Self-pay | Admitting: *Deleted

## 2016-09-04 ENCOUNTER — Ambulatory Visit: Payer: Medicaid Other | Admitting: Pain Medicine

## 2016-09-16 ENCOUNTER — Telehealth: Payer: Self-pay

## 2016-09-16 NOTE — Telephone Encounter (Signed)
Call opened by accident and unable to void

## 2016-09-18 ENCOUNTER — Encounter: Payer: Self-pay | Admitting: Emergency Medicine

## 2016-09-18 ENCOUNTER — Emergency Department
Admission: EM | Admit: 2016-09-18 | Discharge: 2016-09-18 | Disposition: A | Discharge status: Home / Self Care | Payer: Medicaid Other | Attending: Emergency Medicine | Admitting: Emergency Medicine

## 2016-09-18 DIAGNOSIS — I2510 Atherosclerotic heart disease of native coronary artery without angina pectoris: Secondary | ICD-10-CM | POA: Insufficient documentation

## 2016-09-18 DIAGNOSIS — Y929 Unspecified place or not applicable: Secondary | ICD-10-CM | POA: Insufficient documentation

## 2016-09-18 DIAGNOSIS — I10 Essential (primary) hypertension: Secondary | ICD-10-CM | POA: Diagnosis not present

## 2016-09-18 DIAGNOSIS — J449 Chronic obstructive pulmonary disease, unspecified: Secondary | ICD-10-CM | POA: Insufficient documentation

## 2016-09-18 DIAGNOSIS — M545 Low back pain: Secondary | ICD-10-CM

## 2016-09-18 DIAGNOSIS — G8929 Other chronic pain: Secondary | ICD-10-CM | POA: Diagnosis not present

## 2016-09-18 DIAGNOSIS — Y999 Unspecified external cause status: Secondary | ICD-10-CM | POA: Insufficient documentation

## 2016-09-18 DIAGNOSIS — S39012A Strain of muscle, fascia and tendon of lower back, initial encounter: Secondary | ICD-10-CM | POA: Insufficient documentation

## 2016-09-18 DIAGNOSIS — J45909 Unspecified asthma, uncomplicated: Secondary | ICD-10-CM | POA: Insufficient documentation

## 2016-09-18 DIAGNOSIS — X58XXXA Exposure to other specified factors, initial encounter: Secondary | ICD-10-CM | POA: Diagnosis not present

## 2016-09-18 DIAGNOSIS — F17210 Nicotine dependence, cigarettes, uncomplicated: Secondary | ICD-10-CM | POA: Diagnosis not present

## 2016-09-18 DIAGNOSIS — Y939 Activity, unspecified: Secondary | ICD-10-CM | POA: Diagnosis not present

## 2016-09-18 DIAGNOSIS — Z7982 Long term (current) use of aspirin: Secondary | ICD-10-CM | POA: Insufficient documentation

## 2016-09-18 MED ORDER — HYDROCODONE-ACETAMINOPHEN 5-325 MG PO TABS: 1.0000 | ORAL_TABLET | Freq: Three times a day (TID) | ORAL | 0 refills | Status: DC | PRN

## 2016-09-18 NOTE — ED Triage Notes (Signed)
Says woke up sat am with pain in back and it is not getting better.  Has pain going down left leg.

## 2016-09-18 NOTE — ED Provider Notes (Signed)
[facility] Emergency Department Provider Note ____________________________________________  Time seen: 1014  I have reviewed the triage vital signs and the nursing notes.  HISTORY  Chief Complaint  Back Pain  HPI Sara Munoz is a 51 y.o. female presents to the ED for evaluation of her back pain. She gives a history of chronic back pain, but denies any recent injury, accident, or trauma. She is not currently on any narcotic pain medicines, noting she is being referred to [facility] PM from Dr. Consuela Mimes, via her PCP. She claims she never had a pain contract with Dr. Consuela Mimes, and she was unhappy with him doing procedures without writing any prescriptions. She denies leg weakness, foot drop, or incontinence. She doses Mobic and Gabapentin for back pain.   Past Medical History:  Diagnosis Date  . Anxiety   . Asthma   . Atypical chest pain 08/02/2015  . CAD (coronary artery disease)   . Chronic back pain   . COPD (chronic obstructive pulmonary disease) ([place])   . Coronary artery disease    a. Mild-non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/2014  . GERD (gastroesophageal reflux disease)   . Hypercholesteremia   . Hypertension   . Hypokalemia   . Hypomagnesemia 01/04/2014  . Liver disease   . MI (myocardial infarction)   . Osteoarthritis     Patient Active Problem List   Diagnosis Date Noted  . Neurogenic pain 07/31/2016  . Chronic pain syndrome 06/05/2016  . Carotid artery stenosis 02/27/2016  . Chronic constipation 02/27/2016  . Chronic nausea 02/27/2016  . Coronary artery disease 02/27/2016  . Hypercholesterolemia 02/27/2016  . Osteoarthritis 02/27/2016  . PTSD (post-traumatic stress disorder) 02/27/2016  . TIA (transient ischemic attack) 02/27/2016  . Long term current use of opiate analgesic 02/27/2016  . Long term prescription opiate use 02/27/2016  . Opiate use 02/27/2016  . Encounter for therapeutic drug level monitoring 02/27/2016  . Encounter for pain  management consult 02/27/2016  . Chronic hip pain (Location of Tertiary source of pain) (Bilateral) (L>R) 02/27/2016  . Chronic knee pain (Bilateral) (L>R) 02/27/2016  . Chronic shoulder pain (Bilateral) (L>R) 02/27/2016  . Chronic sacroiliac joint pain (Bilateral) (L>R) 02/27/2016  . Chronic low back pain (Location of Primary Source of Pain) (Bilateral) (L>R) 02/27/2016  . Chronic lower extremity pain (Location of Secondary source of pain) (Bilateral) (L>R) 02/27/2016  . Osteoarthritis of hip (Bilateral) (L>R) 02/27/2016  . Chronic neck pain (posterior midline) (Bilateral) (L>R) 02/27/2016  . Cervicogenic headache (Bilateral) (L>R) 02/27/2016  . Occipital headache (Bilateral) (L>R) 02/27/2016  . Chronic upper extremity pain (Bilateral) (L>R) 02/27/2016  . Chronic Cervical radicular pain (Bilateral) (L>R) 02/27/2016  . Chronic lumbar radicular pain (Right) (L5 dermatome) 02/27/2016  . Lumbar facet syndrome (Bilateral) (L>R) 02/27/2016  . Long term prescription benzodiazepine use 02/27/2016  . Hypertension   . COPD (chronic obstructive pulmonary disease) ([place]) 10/09/2015  . GERD (gastroesophageal reflux disease) 10/09/2015  . Non-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/2014 08/02/2015  . Hyperlipidemia 08/02/2015  . Costochondritis 08/02/2015  . 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 GAD (generalized anxiety disorder) 09/03/2014  . Narcotic dependence ([place]) 09/03/2014  . PVD (peripheral vascular disease) ([place]) 09/03/2014  . Smoker 09/03/2014  . Cigarette nicotine dependence with nicotine-induced disorder 07/08/2014  . Anemia of chronic disease 03/19/2014  . Narcotic abuse, continuous 03/19/2014  . Polysubstance abuse 03/19/2014  . Hypomagnesemia 01/04/2014  . Chronic cough 09/04/2011  . Sleep apnea, obstructive 09/04/2011    Past Surgical History:  Procedure  Laterality Date  . CARDIAC CATHETERIZATION Left 02/22/2016   Procedure: Left Heart Cath and Coronary Angiography;  Surgeon: Yolonda Kida,  MD;  Location: [facility];  Service: Cardiovascular;  Laterality: Left;  . hemorroids    . ORIF FEMUR FRACTURE    . ORIF TIBIA & FIBULA FRACTURES    . OVARY SURGERY      Prior to Admission medications   Medication Sig Start Date End Date Taking? Authorizing Provider  albuterol (PROVENTIL HFA;VENTOLIN HFA) 108 (90 Base) MCG/ACT inhaler Inhale 2 puffs into the lungs every 4 (four) hours as needed for wheezing or shortness of breath. 08/03/15   Demetrios Loll, MD  aspirin EC 81 MG tablet Take 81 mg by mouth daily.    Historical Provider, MD  atorvastatin (LIPITOR) 20 MG tablet Take 20 mg by mouth at bedtime.    Historical Provider, MD  budesonide-formoterol (SYMBICORT) 160-4.5 MCG/ACT inhaler Inhale 2 puffs into the lungs 2 (two) times daily. 05/31/15   Gregor Hams, MD  busPIRone (BUSPAR) 7.5 MG tablet Take 7.5 mg by mouth 3 (three) times daily.     Historical Provider, MD  busPIRone (BUSPAR) 7.5 MG tablet Take by mouth.    Historical Provider, MD  dexlansoprazole (DEXILANT) 60 MG capsule Take 60 mg by mouth daily.    Historical Provider, MD  docusate sodium (COLACE) 100 MG capsule Take 200 mg by mouth 2 (two) times daily as needed for mild constipation.    Historical Provider, MD  doxycycline (MONODOX) 100 MG capsule Take by mouth. 08/22/16 11/20/16  Historical Provider, MD  gabapentin (NEURONTIN) 300 MG capsule Take 2-3 capsules (600-900 mg total) by mouth 3 (three) times daily. 07/31/16 10/29/16  Milinda Pointer, MD  HYDROcodone-acetaminophen (NORCO) 5-325 MG tablet Take 1 tablet by mouth 3 (three) times daily as needed. 09/18/16   Arlet Marter V Bacon Trinita Devlin, [name]  linaclotide (LINZESS) 290 MCG CAPS capsule Take 290 mcg by mouth daily before breakfast.    Historical Provider, MD  lisinopril (PRINIVIL,ZESTRIL) 20 MG tablet Take 40 mg by mouth daily.     Historical Provider, MD  Magnesium Oxide 500 MG CAPS Take 1 capsule (500 mg total) by mouth 2 (two) times daily at 8 am and 10 pm. 04/24/16    Milinda Pointer, MD  meloxicam (MOBIC) 15 MG tablet Take 1 tablet (15 mg total) by mouth daily. 06/05/16 09/03/16  Milinda Pointer, MD  metoprolol tartrate (LOPRESSOR) 25 MG tablet Take 1 tablet (25 mg total) by mouth 2 (two) times daily. 08/03/15   Demetrios Loll, MD  nitroGLYCERIN (NITROSTAT) 0.4 MG SL tablet Place 1 tablet (0.4 mg total) under the tongue every 5 (five) minutes x 3 doses as needed for chest pain. 08/03/15   Demetrios Loll, MD  ondansetron (ZOFRAN ODT) 4 MG disintegrating tablet Take 1 tablet (4 mg total) by mouth every 8 (eight) hours as needed for nausea or vomiting. 06/13/16   Gregor Hams, MD  ondansetron (ZOFRAN) 4 MG tablet Take 1 tablet (4 mg total) by mouth daily as needed for nausea or vomiting. 08/07/16 08/07/17  Gregor Hams, MD  oxyCODONE-acetaminophen (ROXICET) 5-325 MG tablet Take 1 tablet by mouth every 4 (four) hours as needed for severe pain. Patient not taking: Reported on 08/28/2016 08/07/16   Gregor Hams, MD  potassium chloride (KLOR-CON) 20 MEQ packet Take 20 mEq by mouth 2 (two) times daily.     Historical Provider, MD  RANEXA 500 MG 12 hr tablet Take 1 tablet (500  mg total) by mouth 2 (two) times daily. 08/03/15   Demetrios Loll, MD  tiZANidine (ZANAFLEX) 4 MG tablet Take 1-2 tablets (4-8 mg total) by mouth every 6 (six) hours as needed for muscle spasms. 07/31/16 10/29/16  Milinda Pointer, MD    Allergies Levofloxacin; Flexeril [cyclobenzaprine]; Keflex [cephalexin]; Ketorolac; Toradol [ketorolac tromethamine]; Tramadol; and Zoloft [sertraline hcl]  Family History  Problem Relation Age of Onset  . Heart disease Mother   . Lung cancer Mother   . Ovarian cancer Mother     Social History Social History  Substance Use Topics  . Smoking status: Current Every Day Smoker    Packs/day: 0.50    Types: Cigarettes  . Smokeless tobacco: Never Used  . Alcohol use 0.0 oz/week     Comment: drinks occasionally    Review of Systems  Constitutional: Negative for  fever. Cardiovascular: Negative for chest pain. Respiratory: Negative for shortness of breath. Gastrointestinal: Negative for abdominal pain, vomiting and diarrhea. Genitourinary: Negative for dysuria. Musculoskeletal: Positive for back pain. Skin: Negative for rash. Neurological: Negative for headaches, focal weakness or numbness. ____________________________________________  PHYSICAL EXAM:  VITAL SIGNS: ED Triage Vitals  Enc Vitals Group     BP 09/18/16 0957 (!) 150/84     Pulse Rate 09/18/16 0957 79     Resp 09/18/16 0957 16     Temp 09/18/16 0957 97.8 F (36.6 C)     Temp Source 09/18/16 0957 Oral     SpO2 09/18/16 0957 99 %     Weight 09/18/16 0957 200 lb (90.7 kg)     Height 09/18/16 0957 5\' 4"  (1.626 m)     Head Circumference --      Peak Flow --      Pain Score 09/18/16 0956 10     Pain Loc --      Pain Edu? --      Excl. in [place]? --     Constitutional: Alert and oriented. Well appearing and in no distress. Head: Normocephalic and atraumatic. Cardiovascular: Normal rate, regular rhythm. Normal distal pulses. Respiratory: Normal respiratory effort. No wheezes/rales/rhonchi. Gastrointestinal: Soft and nontender. No distention. Musculoskeletal: Normal spinal alignment without midline tenderness, spasm, deformity, or step-off. Normal sit to stand transition. Negative seated SLR bilaterally. Nontender with normal range of motion in all extremities.  Neurologic:  CN II-XII grossly intact. Normal LE DTRs bilaterally. Normal toe/heel raise. Normal gait without ataxia. Normal speech and language. No gross focal neurologic deficits are appreciated. Skin:  Skin is warm, dry and intact. No rash noted. Psychiatric: Mood and affect are normal. Patient exhibits appropriate insight and judgment. ____________________________________________  INITIAL IMPRESSION / ASSESSMENT AND PLAN / ED COURSE  Patient with chronic back pain with a presentation of a flare of back pain without  incontinence. Here exam is essentially normal without neuromuscular deficit. She is discharged with a prescription for a few Norco. She is further advised that she must see her PCP in the interim for medical and pain management.  ____________________________________________  FINAL CLINICAL IMPRESSION(S) / ED DIAGNOSES  Final diagnoses:  Chronic left-sided low back pain without sciatica  Strain of lumbar region, initial encounter     Melvenia Needles, [name] 09/18/16 [place], MD 10/01/16 2110

## 2016-09-18 NOTE — Discharge Instructions (Signed)
Your exam does not reveal any serious injury resulting from your fall. You must follow-up with your primary care provider at [facility] for routine and interim pain management. Continue with your home meds including Gabapentin and Mobic. You may not receive narcotic pain medicine during future visits for this complaint. Please review our chronic pain management policy.

## 2016-09-19 ENCOUNTER — Other Ambulatory Visit: Payer: Self-pay | Admitting: Pain Medicine

## 2016-09-19 ENCOUNTER — Ambulatory Visit: Payer: Medicaid Other | Admitting: Anesthesiology

## 2016-09-19 ENCOUNTER — Encounter
Admission: RE | Disposition: A | Discharge status: Home / Self Care | Payer: Self-pay | Source: Ambulatory Visit | Attending: Gastroenterology

## 2016-09-19 ENCOUNTER — Ambulatory Visit
Admission: RE | Admit: 2016-09-19 | Discharge: 2016-09-19 | Disposition: A | Discharge status: Home / Self Care | Payer: Medicaid Other | Source: Ambulatory Visit | Attending: Gastroenterology | Admitting: Gastroenterology

## 2016-09-19 DIAGNOSIS — Z79899 Other long term (current) drug therapy: Secondary | ICD-10-CM | POA: Diagnosis not present

## 2016-09-19 DIAGNOSIS — D122 Benign neoplasm of ascending colon: Secondary | ICD-10-CM | POA: Insufficient documentation

## 2016-09-19 DIAGNOSIS — I252 Old myocardial infarction: Secondary | ICD-10-CM | POA: Diagnosis not present

## 2016-09-19 DIAGNOSIS — I739 Peripheral vascular disease, unspecified: Secondary | ICD-10-CM | POA: Diagnosis not present

## 2016-09-19 DIAGNOSIS — F17210 Nicotine dependence, cigarettes, uncomplicated: Secondary | ICD-10-CM | POA: Diagnosis not present

## 2016-09-19 DIAGNOSIS — Z9989 Dependence on other enabling machines and devices: Secondary | ICD-10-CM | POA: Diagnosis not present

## 2016-09-19 DIAGNOSIS — F419 Anxiety disorder, unspecified: Secondary | ICD-10-CM | POA: Diagnosis not present

## 2016-09-19 DIAGNOSIS — K625 Hemorrhage of anus and rectum: Secondary | ICD-10-CM | POA: Insufficient documentation

## 2016-09-19 DIAGNOSIS — E7800 Pure hypercholesterolemia, unspecified: Secondary | ICD-10-CM | POA: Diagnosis not present

## 2016-09-19 DIAGNOSIS — D123 Benign neoplasm of transverse colon: Secondary | ICD-10-CM

## 2016-09-19 DIAGNOSIS — K621 Rectal polyp: Secondary | ICD-10-CM | POA: Diagnosis not present

## 2016-09-19 DIAGNOSIS — Z8673 Personal history of transient ischemic attack (TIA), and cerebral infarction without residual deficits: Secondary | ICD-10-CM | POA: Diagnosis not present

## 2016-09-19 DIAGNOSIS — K219 Gastro-esophageal reflux disease without esophagitis: Secondary | ICD-10-CM | POA: Diagnosis not present

## 2016-09-19 DIAGNOSIS — J449 Chronic obstructive pulmonary disease, unspecified: Secondary | ICD-10-CM | POA: Diagnosis not present

## 2016-09-19 DIAGNOSIS — Z7951 Long term (current) use of inhaled steroids: Secondary | ICD-10-CM | POA: Insufficient documentation

## 2016-09-19 DIAGNOSIS — I10 Essential (primary) hypertension: Secondary | ICD-10-CM | POA: Diagnosis not present

## 2016-09-19 DIAGNOSIS — K635 Polyp of colon: Secondary | ICD-10-CM | POA: Insufficient documentation

## 2016-09-19 DIAGNOSIS — M545 Low back pain, unspecified: Secondary | ICD-10-CM

## 2016-09-19 DIAGNOSIS — G4730 Sleep apnea, unspecified: Secondary | ICD-10-CM | POA: Diagnosis not present

## 2016-09-19 DIAGNOSIS — Z7982 Long term (current) use of aspirin: Secondary | ICD-10-CM | POA: Insufficient documentation

## 2016-09-19 DIAGNOSIS — I2510 Atherosclerotic heart disease of native coronary artery without angina pectoris: Secondary | ICD-10-CM | POA: Insufficient documentation

## 2016-09-19 DIAGNOSIS — K640 First degree hemorrhoids: Secondary | ICD-10-CM | POA: Diagnosis not present

## 2016-09-19 DIAGNOSIS — G8929 Other chronic pain: Secondary | ICD-10-CM

## 2016-09-19 HISTORY — PX: COLONOSCOPY WITH PROPOFOL: SHX5780

## 2016-09-19 LAB — POCT PREGNANCY, URINE: Preg Test, Ur: NEGATIVE

## 2016-09-19 SURGERY — COLONOSCOPY WITH PROPOFOL
Anesthesia: General

## 2016-09-19 MED ORDER — PROPOFOL 500 MG/50ML IV EMUL
INTRAVENOUS | Status: DC | PRN
  Administered 2016-09-19: 150 ug/kg/min via INTRAVENOUS

## 2016-09-19 MED ORDER — LIDOCAINE HCL (PF) 1 % IJ SOLN
2.0000 mL | Freq: Once | INTRAMUSCULAR | Status: AC
  Administered 2016-09-19: 0.3 mL via INTRADERMAL
  Filled 2016-09-19: qty 2

## 2016-09-19 MED ORDER — PROPOFOL 500 MG/50ML IV EMUL
INTRAVENOUS | Status: AC
  Filled 2016-09-19: qty 50

## 2016-09-19 MED ORDER — PROPOFOL 10 MG/ML IV BOLUS
INTRAVENOUS | Status: DC | PRN
  Administered 2016-09-19: 70 mg via INTRAVENOUS
  Administered 2016-09-19: 20 mg via INTRAVENOUS

## 2016-09-19 MED ORDER — SODIUM CHLORIDE 0.9 % IV SOLN
INTRAVENOUS | Status: DC
  Administered 2016-09-19: 1000 mL via INTRAVENOUS

## 2016-09-19 NOTE — Transfer of Care (Signed)
Immediate Anesthesia Transfer of Care Note  Patient: Sara Munoz  Procedure(s) Performed: Procedure(s): COLONOSCOPY WITH PROPOFOL (N/A)  Patient Location: PACU  Anesthesia Type:General  Level of Consciousness: sedated and responds to stimulation  Airway & Oxygen Therapy: Patient Spontanous Breathing and Patient connected to nasal cannula oxygen  Post-op Assessment: Report given to RN and Post -op Vital signs reviewed and stable  Post vital signs: Reviewed and stable  Last Vitals:  Vitals:   09/19/16 0926 09/19/16 0927  BP: (!) 102/54 (!) 102/54  Pulse: 76 75  Resp: 14 13  Temp: (!) 36 C     Last Pain:  Vitals:   09/19/16 0926  TempSrc: Tympanic  PainSc:          Complications: No apparent anesthesia complications

## 2016-09-19 NOTE — Anesthesia Postprocedure Evaluation (Signed)
Anesthesia Post Note  Patient: Sara Munoz  Procedure(s) Performed: Procedure(s) (LRB): COLONOSCOPY WITH PROPOFOL (N/A)  Patient location during evaluation: PACU Anesthesia Type: General Level of consciousness: awake and alert Pain management: pain level controlled Vital Signs Assessment: post-procedure vital signs reviewed and stable Respiratory status: spontaneous breathing, nonlabored ventilation, respiratory function stable and patient connected to nasal cannula oxygen Cardiovascular status: blood pressure returned to baseline and stable Postop Assessment: no signs of nausea or vomiting Anesthetic complications: no     Last Vitals:  Vitals:   09/19/16 0946 09/19/16 0956  BP: (!) 157/90 (!) 155/78  Pulse: 71 80  Resp: 20 17  Temp:      Last Pain:  Vitals:   09/19/16 0926  TempSrc: Tympanic  PainSc:                  Molli Barrows

## 2016-09-19 NOTE — H&P (Signed)
[name] MD [address] [place], [place] [address] Phone: [phone] Fax : [phone]  Primary Care Physician:  [place] Primary Gastroenterologist:  Dr. [name]   Pre-Procedure History & Physical: HPI:  Sara Munoz is a 51 y.o. female is here for an colonoscopy.   Past Medical History:  Diagnosis Date  . Anxiety   . Asthma   . Atypical chest pain 08/02/2015  . CAD (coronary artery disease)   . Chronic back pain   . COPD (chronic obstructive pulmonary disease) ([place])   . Coronary artery disease    a. Mild-non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/2014  . GERD (gastroesophageal reflux disease)   . Hypercholesteremia   . Hypertension   . Hypokalemia   . Hypomagnesemia 01/04/2014  . Liver disease   . MI (myocardial infarction)   . Osteoarthritis     Past Surgical History:  Procedure Laterality Date  . CARDIAC CATHETERIZATION Left 02/22/2016   Procedure: Left Heart Cath and Coronary Angiography;  Surgeon: Yolonda Kida, MD;  Location: [facility];  Service: Cardiovascular;  Laterality: Left;  . hemorroids    . ORIF FEMUR FRACTURE    . ORIF TIBIA & FIBULA FRACTURES    . OVARY SURGERY      Prior to Admission medications   Medication Sig Start Date End Date Taking? Authorizing Provider  albuterol (PROVENTIL HFA;VENTOLIN HFA) 108 (90 Base) MCG/ACT inhaler Inhale 2 puffs into the lungs every 4 (four) hours as needed for wheezing or shortness of breath. 08/03/15   Demetrios Loll, MD  aspirin EC 81 MG tablet Take 81 mg by mouth daily.    Historical Provider, MD  atorvastatin (LIPITOR) 20 MG tablet Take 20 mg by mouth at bedtime.    Historical Provider, MD  budesonide-formoterol (SYMBICORT) 160-4.5 MCG/ACT inhaler Inhale 2 puffs into the lungs 2 (two) times daily. 05/31/15   Gregor Hams, MD  busPIRone (BUSPAR) 7.5 MG tablet Take 7.5 mg by mouth 3 (three) times daily.     Historical Provider, MD  busPIRone (BUSPAR) 7.5 MG tablet Take by  mouth.    Historical Provider, MD  dexlansoprazole (DEXILANT) 60 MG capsule Take 60 mg by mouth daily.    Historical Provider, MD  docusate sodium (COLACE) 100 MG capsule Take 200 mg by mouth 2 (two) times daily as needed for mild constipation.    Historical Provider, MD  doxycycline (MONODOX) 100 MG capsule Take by mouth. 08/22/16 11/20/16  Historical Provider, MD  gabapentin (NEURONTIN) 300 MG capsule Take 2-3 capsules (600-900 mg total) by mouth 3 (three) times daily. 07/31/16 10/29/16  Milinda Pointer, MD  HYDROcodone-acetaminophen (NORCO) 5-325 MG tablet Take 1 tablet by mouth 3 (three) times daily as needed. 09/18/16   Jenise V Bacon Menshew, [name]  linaclotide (LINZESS) 290 MCG CAPS capsule Take 290 mcg by mouth daily before breakfast.    Historical Provider, MD  lisinopril (PRINIVIL,ZESTRIL) 20 MG tablet Take 40 mg by mouth daily.     Historical Provider, MD  Magnesium Oxide 500 MG CAPS Take 1 capsule (500 mg total) by mouth 2 (two) times daily at 8 am and 10 pm. 04/24/16   Milinda Pointer, MD  meloxicam (MOBIC) 15 MG tablet Take 1 tablet (15 mg total) by mouth daily. 06/05/16 09/03/16  Milinda Pointer, MD  metoprolol tartrate (LOPRESSOR) 25 MG tablet Take 1 tablet (25 mg total) by mouth 2 (two) times daily. 08/03/15   Demetrios Loll, MD  nitroGLYCERIN (NITROSTAT) 0.4 MG SL  tablet Place 1 tablet (0.4 mg total) under the tongue every 5 (five) minutes x 3 doses as needed for chest pain. 08/03/15   Demetrios Loll, MD  ondansetron (ZOFRAN ODT) 4 MG disintegrating tablet Take 1 tablet (4 mg total) by mouth every 8 (eight) hours as needed for nausea or vomiting. 06/13/16   Gregor Hams, MD  ondansetron (ZOFRAN) 4 MG tablet Take 1 tablet (4 mg total) by mouth daily as needed for nausea or vomiting. 08/07/16 08/07/17  Gregor Hams, MD  oxyCODONE-acetaminophen (ROXICET) 5-325 MG tablet Take 1 tablet by mouth every 4 (four) hours as needed for severe pain. Patient not taking: Reported on 08/28/2016 08/07/16   Gregor Hams, MD  potassium chloride (KLOR-CON) 20 MEQ packet Take 20 mEq by mouth 2 (two) times daily.     Historical Provider, MD  RANEXA 500 MG 12 hr tablet Take 1 tablet (500 mg total) by mouth 2 (two) times daily. 08/03/15   Demetrios Loll, MD  tiZANidine (ZANAFLEX) 4 MG tablet Take 1-2 tablets (4-8 mg total) by mouth every 6 (six) hours as needed for muscle spasms. 07/31/16 10/29/16  Milinda Pointer, MD    Allergies as of 09/03/2016 - Review Complete 08/28/2016  Allergen Reaction Noted  . Levofloxacin Hives and Swelling 02/27/2012  . Flexeril [cyclobenzaprine] Hives, Swelling, and Other (See Comments) 11/13/2013  . Keflex [cephalexin] Hives 10/22/2010  . Ketorolac  10/22/2010  . Toradol [ketorolac tromethamine] Swelling and Other (See Comments) 01/17/2015  . Tramadol Hives, Swelling, and Other (See Comments) 01/17/2015  . Zoloft [sertraline hcl] Swelling and Other (See Comments) 05/31/2015    Family History  Problem Relation Age of Onset  . Heart disease Mother   . Lung cancer Mother   . Ovarian cancer Mother     Social History   Social History  . Marital status: Widowed    Spouse name: N/A  . Number of children: N/A  . Years of education: N/A   Occupational History  . disabled    Social History Main Topics  . Smoking status: Current Every Day Smoker    Packs/day: 0.50    Types: Cigarettes  . Smokeless tobacco: Never Used  . Alcohol use 0.0 oz/week     Comment: drinks occasionally  . Drug use: Yes    Types: Marijuana  . Sexual activity: Yes   Other Topics Concern  . Not on file   Social History Narrative   Moved from [place].    Review of Systems: See HPI, otherwise negative ROS  Physical Exam: BP (!) 153/90   Pulse 75   Temp (!) 96.4 F (35.8 C) (Tympanic)   Resp 17   Ht 5\' 4"  (1.626 m)   Wt 200 lb (90.7 kg)   LMP 09/18/2016   SpO2 99%   BMI 34.33 kg/m  General:   Alert,  pleasant and cooperative in NAD Head:  Normocephalic and atraumatic. Neck:   Supple; no masses or thyromegaly. Lungs:  Clear throughout to auscultation.    Heart:  Regular rate and rhythm. Abdomen:  Soft, nontender and nondistended. Normal bowel sounds, without guarding, and without rebound.   Neurologic:  Alert and  oriented x4;  grossly normal neurologically.  Impression/Plan: Sara Munoz is here for an colonoscopy to be performed for rectal bleeding   Risks, benefits, limitations, and alternatives regarding  colonoscopy have been reviewed with the patient.  Questions have been answered.  All parties agreeable.   [name], MD  09/19/2016, 8:07 AM

## 2016-09-19 NOTE — Anesthesia Procedure Notes (Signed)
Performed by: Sherlie Boyum Pre-anesthesia Checklist: Patient identified, Emergency Drugs available, Suction available, Patient being monitored and Timeout performed Patient Re-evaluated:Patient Re-evaluated prior to inductionOxygen Delivery Method: Nasal cannula Preoxygenation: Pre-oxygenation with 100% oxygen Intubation Type: IV induction       

## 2016-09-19 NOTE — Op Note (Signed)
[facility] Patient Name: Sara Munoz Procedure Date: 09/19/2016 8:52 AM MRN: [id number] Account #: [id number] Date of Birth: [birth year] Admit Type: Outpatient Age: 51 Room: [facility] ENDO ROOM 4 Gender: Female Note Status: Finalized Procedure:            Colonoscopy Indications:          Rectal bleeding Providers:            [name] MD, MD Referring MD:         Allyne Gee, MD (Referring MD) Medicines:            Monitored Anesthesia Care Complications:        No immediate complications. Procedure:            Pre-Anesthesia Assessment:                       - Prior to the procedure, a History and Physical was                        performed, and patient medications, allergies and                        sensitivities were reviewed. The patient's tolerance of                        previous anesthesia was reviewed.                       - The risks and benefits of the procedure and the                        sedation options and risks were discussed with the                        patient. All questions were answered and informed                        consent was obtained.                       - ASA Grade Assessment: IV - A patient with severe                        systemic disease that is a constant threat to life.                       After obtaining informed consent, the colonoscope was                        passed under direct vision. Throughout the procedure,                        the patient's blood pressure, pulse, and oxygen                        saturations were monitored continuously. The                        Colonoscope was introduced through the anus and  advanced to the the cecum, identified by the                        appendiceal orifice, IC valve and transillumination.                        The colonoscopy was performed with ease. The patient                        tolerated the procedure well.  The quality of the bowel                        preparation was excellent. Findings:      The perianal and digital rectal examinations were normal.      Three sessile polyps were found in the rectum, transverse colon and       ascending colon. The polyps were 5 to 7 mm in size. These polyps were       removed with a cold snare. Resection and retrieval were complete.      Non-bleeding internal hemorrhoids were found during retroflexion. The       hemorrhoids were small and Grade I (internal hemorrhoids that do not       prolapse). Impression:           - Three 5 to 7 mm polyps in the rectum, in the                        transverse colon and in the ascending colon, removed                        with a cold snare. Resected and retrieved.                       - Non-bleeding internal hemorrhoids. Recommendation:       - Discharge patient to home (with escort).                       - Resume previous diet.                       - Continue present medications.                       - Await pathology results.                       - Repeat colonoscopy in 3 - 5 years for surveillance                        based on pathology results.                       - Return to GI office as previously scheduled. Procedure Code(s):    --- Professional ---                       [phone], Colonoscopy, flexible; with removal of tumor(s),                        polyp(s), or other lesion(s) by snare technique Diagnosis Code(s):    --- Professional ---  K62.1, Rectal polyp                       D12.3, Benign neoplasm of transverse colon (hepatic                        flexure or splenic flexure)                       D12.2, Benign neoplasm of ascending colon                       K64.0, First degree hemorrhoids                       K62.5, Hemorrhage of anus and rectum CPT copyright 2016 American Medical [employer]. All rights reserved. The codes documented in this report are preliminary  and upon coder review may  be revised to meet current compliance requirements. [name], MD [name] MD, MD 09/19/2016 9:30:07 AM This report has been signed electronically. Number of Addenda: 0 Note Initiated On: 09/19/2016 8:52 AM Scope Withdrawal Time: 0 hours 13 minutes 14 seconds  Total Procedure Duration: 0 hours 17 minutes 41 seconds       [facility]

## 2016-09-19 NOTE — Anesthesia Post-op Follow-up Note (Cosign Needed)
Anesthesia QCDR form completed.        

## 2016-09-19 NOTE — Anesthesia Preprocedure Evaluation (Signed)
Anesthesia Evaluation  Patient identified by MRN, date of birth, ID band Patient awake    Reviewed: Allergy & Precautions, H&P , NPO status , Patient's Chart, lab work & pertinent test results, reviewed documented beta blocker date and time   Airway Mallampati: II   Neck ROM: full    Dental  (+) Poor Dentition   Pulmonary neg pulmonary ROS, asthma , sleep apnea and Continuous Positive Airway Pressure Ventilation , COPD, Current Smoker,    Pulmonary exam normal        Cardiovascular hypertension, + CAD, + Past MI and + Peripheral Vascular Disease  negative cardio ROS Normal cardiovascular exam Rhythm:regular Rate:Normal     Neuro/Psych  Headaches, PSYCHIATRIC DISORDERS TIA Neuromuscular disease negative neurological ROS  negative psych ROS   GI/Hepatic negative GI ROS, Neg liver ROS, GERD  Medicated,  Endo/Other  negative endocrine ROS  Renal/GU negative Renal ROS  negative genitourinary   Musculoskeletal   Abdominal   Peds  Hematology negative hematology ROS (+) anemia ,   Anesthesia Other Findings Past Medical History: No date: Anxiety No date: Asthma 08/02/2015: Atypical chest pain No date: CAD (coronary artery disease) No date: Chronic back pain No date: COPD (chronic obstructive pulmonary disease) (* No date: Coronary artery disease     Comment: a. Mild-non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/2014 No date: GERD (gastroesophageal reflux disease) No date: Hypercholesteremia No date: Hypertension No date: Hypokalemia 01/04/2014: Hypomagnesemia No date: Liver disease No date: MI (myocardial infarction) No date: Osteoarthritis Past Surgical History: 02/22/2016: CARDIAC CATHETERIZATION Left     Comment: Procedure: Left Heart Cath and Coronary               Angiography;  Surgeon: Yolonda Kida, MD;                Location: [facility];  Service:               Cardiovascular;  Laterality: Left; No date:  hemorroids No date: ORIF FEMUR FRACTURE No date: ORIF TIBIA & FIBULA FRACTURES No date: OVARY SURGERY   Reproductive/Obstetrics negative OB ROS                             Anesthesia Physical Anesthesia Plan  ASA: IV  Anesthesia Plan: General   Post-op Pain Management:    Induction:   Airway Management Planned:   Additional Equipment:   Intra-op Plan:   Post-operative Plan:   Informed Consent: I have reviewed the patients History and Physical, chart, labs and discussed the procedure including the risks, benefits and alternatives for the proposed anesthesia with the patient or authorized representative who has indicated his/her understanding and acceptance.   Dental Advisory Given  Plan Discussed with: CRNA  Anesthesia Plan Comments:         Anesthesia Quick Evaluation

## 2016-09-20 ENCOUNTER — Encounter: Payer: Self-pay | Admitting: Gastroenterology

## 2016-09-20 LAB — SURGICAL PATHOLOGY

## 2016-09-23 ENCOUNTER — Encounter: Payer: Self-pay | Admitting: Gastroenterology

## 2016-09-24 ENCOUNTER — Emergency Department: Payer: Medicaid Other

## 2016-09-24 ENCOUNTER — Emergency Department
Admission: EM | Admit: 2016-09-24 | Discharge: 2016-09-24 | Disposition: A | Discharge status: Home / Self Care | Payer: Medicaid Other | Attending: Emergency Medicine | Admitting: Emergency Medicine

## 2016-09-24 DIAGNOSIS — Y999 Unspecified external cause status: Secondary | ICD-10-CM | POA: Diagnosis not present

## 2016-09-24 DIAGNOSIS — F1290 Cannabis use, unspecified, uncomplicated: Secondary | ICD-10-CM | POA: Insufficient documentation

## 2016-09-24 DIAGNOSIS — I2510 Atherosclerotic heart disease of native coronary artery without angina pectoris: Secondary | ICD-10-CM | POA: Insufficient documentation

## 2016-09-24 DIAGNOSIS — I10 Essential (primary) hypertension: Secondary | ICD-10-CM | POA: Diagnosis not present

## 2016-09-24 DIAGNOSIS — F17210 Nicotine dependence, cigarettes, uncomplicated: Secondary | ICD-10-CM | POA: Insufficient documentation

## 2016-09-24 DIAGNOSIS — S79912A Unspecified injury of left hip, initial encounter: Secondary | ICD-10-CM | POA: Diagnosis present

## 2016-09-24 DIAGNOSIS — S7002XA Contusion of left hip, initial encounter: Secondary | ICD-10-CM | POA: Insufficient documentation

## 2016-09-24 DIAGNOSIS — Z7982 Long term (current) use of aspirin: Secondary | ICD-10-CM | POA: Diagnosis not present

## 2016-09-24 DIAGNOSIS — M25552 Pain in left hip: Secondary | ICD-10-CM

## 2016-09-24 DIAGNOSIS — W108XXA Fall (on) (from) other stairs and steps, initial encounter: Secondary | ICD-10-CM | POA: Diagnosis not present

## 2016-09-24 DIAGNOSIS — Y92009 Unspecified place in unspecified non-institutional (private) residence as the place of occurrence of the external cause: Secondary | ICD-10-CM | POA: Insufficient documentation

## 2016-09-24 DIAGNOSIS — J45909 Unspecified asthma, uncomplicated: Secondary | ICD-10-CM | POA: Insufficient documentation

## 2016-09-24 DIAGNOSIS — M545 Low back pain: Secondary | ICD-10-CM | POA: Diagnosis not present

## 2016-09-24 DIAGNOSIS — J449 Chronic obstructive pulmonary disease, unspecified: Secondary | ICD-10-CM | POA: Diagnosis not present

## 2016-09-24 DIAGNOSIS — Y939 Activity, unspecified: Secondary | ICD-10-CM | POA: Diagnosis not present

## 2016-09-24 MED ORDER — LIDOCAINE 5 % EX PTCH: 1.0000 | MEDICATED_PATCH | Freq: Two times a day (BID) | CUTANEOUS | 0 refills | Status: DC

## 2016-09-24 MED ORDER — DIAZEPAM 2 MG PO TABS: 2.0000 mg | ORAL_TABLET | Freq: Four times a day (QID) | ORAL | 0 refills | Status: DC | PRN

## 2016-09-24 MED ORDER — METHYLPREDNISOLONE SODIUM SUCC 125 MG IJ SOLR
125.0000 mg | Freq: Once | INTRAMUSCULAR | Status: AC
  Administered 2016-09-24: 125 mg via INTRAVENOUS
  Filled 2016-09-24: qty 2

## 2016-09-24 MED ORDER — OXYCODONE-ACETAMINOPHEN 5-325 MG PO TABS
1.0000 | ORAL_TABLET | Freq: Once | ORAL | Status: AC
  Administered 2016-09-24: 1 via ORAL
  Filled 2016-09-24: qty 1

## 2016-09-24 MED ORDER — MORPHINE SULFATE (PF) 4 MG/ML IV SOLN
4.0000 mg | Freq: Once | INTRAVENOUS | Status: AC
  Administered 2016-09-24: 4 mg via INTRAVENOUS
  Filled 2016-09-24: qty 1

## 2016-09-24 MED ORDER — DIAZEPAM 2 MG PO TABS
2.0000 mg | ORAL_TABLET | Freq: Once | ORAL | Status: AC
  Administered 2016-09-24: 2 mg via ORAL
  Filled 2016-09-24: qty 1

## 2016-09-24 MED ORDER — ETODOLAC 200 MG PO CAPS: 200.0000 mg | ORAL_CAPSULE | Freq: Three times a day (TID) | ORAL | 0 refills | Status: DC

## 2016-09-24 MED ORDER — LIDOCAINE 5 % EX PTCH
1.0000 | MEDICATED_PATCH | CUTANEOUS | Status: DC
  Administered 2016-09-24: 1 via TRANSDERMAL
  Filled 2016-09-24: qty 1

## 2016-09-24 NOTE — Discharge Instructions (Signed)
Please ice her hip and take her medication. Please follow-up with orthopedics or your primary care physician for further evaluation.

## 2016-09-24 NOTE — ED Notes (Signed)
Patient discharged to home per MD order. Patient in stable condition, and deemed medically cleared by ED provider for discharge. Discharge instructions reviewed with patient/family using "Teach Back"; verbalized understanding of medication education and administration, and information about follow-up care. Denies further concerns. ° °

## 2016-09-24 NOTE — ED Notes (Signed)
Pt indicates left lower back as pain source--lidoderm patch applied there as ordered

## 2016-09-24 NOTE — ED Provider Notes (Signed)
[facility] Emergency Department Provider Note   ____________________________________________   First MD Initiated Contact with Patient 09/24/16 0015     (approximate)  I have reviewed the triage vital signs and the nursing notes.   HISTORY  Chief Complaint Fall    HPI Sara Munoz is a 51 y.o. female who comes into the hospital today with some left hip pain.The patient fell going up her front steps. She reports that she fell onto her left hip and has some hip pain that radiates to her back. She denies hitting her head and denies any neck pain. EMS came and reports that they gave the patient 100 g of fentanyl but it did not help her pain. The patient rates her pain a 9 out of 10 in intensity. She is unable to sit up or stand and walk because of her pain. The patient reports that she drank 3 beers. She states that her leg sometimes gives out on her and that is what happened when she was going up the steps. She has some numbness and tingling in her legs feet and hands but she reports that this is not uncommon for her. The patient came into the hospital today for evaluation. She does have a history of chronic pain. She was seen on March 28 and treated for her chronic back pain.   Past Medical History:  Diagnosis Date  . Anxiety   . Asthma   . Atypical chest pain 08/02/2015  . CAD (coronary artery disease)   . Chronic back pain   . COPD (chronic obstructive pulmonary disease) ([place])   . Coronary artery disease    a. Mild-non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/2014  . GERD (gastroesophageal reflux disease)   . Hypercholesteremia   . Hypertension   . Hypokalemia   . Hypomagnesemia 01/04/2014  . Liver disease   . MI (myocardial infarction)   . Osteoarthritis     Patient Active Problem List   Diagnosis Date Noted  . Neurogenic pain 07/31/2016  . Chronic pain syndrome 06/05/2016  . Carotid artery stenosis 02/27/2016  . Chronic constipation 02/27/2016  .  Chronic nausea 02/27/2016  . Coronary artery disease 02/27/2016  . Hypercholesterolemia 02/27/2016  . Osteoarthritis 02/27/2016  . PTSD (post-traumatic stress disorder) 02/27/2016  . TIA (transient ischemic attack) 02/27/2016  . Long term current use of opiate analgesic 02/27/2016  . Long term prescription opiate use 02/27/2016  . Opiate use 02/27/2016  . Encounter for therapeutic drug level monitoring 02/27/2016  . Encounter for pain management consult 02/27/2016  . Chronic hip pain (Location of Tertiary source of pain) (Bilateral) (L>R) 02/27/2016  . Chronic knee pain (Bilateral) (L>R) 02/27/2016  . Chronic shoulder pain (Bilateral) (L>R) 02/27/2016  . Chronic sacroiliac joint pain (Bilateral) (L>R) 02/27/2016  . Chronic low back pain (Location of Primary Source of Pain) (Bilateral) (L>R) 02/27/2016  . Chronic lower extremity pain (Location of Secondary source of pain) (Bilateral) (L>R) 02/27/2016  . Osteoarthritis of hip (Bilateral) (L>R) 02/27/2016  . Chronic neck pain (posterior midline) (Bilateral) (L>R) 02/27/2016  . Cervicogenic headache (Bilateral) (L>R) 02/27/2016  . Occipital headache (Bilateral) (L>R) 02/27/2016  . Chronic upper extremity pain (Bilateral) (L>R) 02/27/2016  . Chronic Cervical radicular pain (Bilateral) (L>R) 02/27/2016  . Chronic lumbar radicular pain (Right) (L5 dermatome) 02/27/2016  . Lumbar facet syndrome (Bilateral) (L>R) 02/27/2016  . Long term prescription benzodiazepine use 02/27/2016  . Hypertension   . COPD (chronic obstructive pulmonary disease) ([place]) 10/09/2015  . GERD (gastroesophageal  reflux disease) 10/09/2015  . Non-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/2014 08/02/2015  . Hyperlipidemia 08/02/2015  . Costochondritis 08/02/2015  . 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 GAD (generalized anxiety disorder) 09/03/2014  . Narcotic dependence ([place]) 09/03/2014  . PVD (peripheral vascular disease) ([place]) 09/03/2014  . Smoker 09/03/2014  . Cigarette nicotine  dependence with nicotine-induced disorder 07/08/2014  . Anemia of chronic disease 03/19/2014  . Narcotic abuse, continuous 03/19/2014  . Polysubstance abuse 03/19/2014  . Hypomagnesemia 01/04/2014  . Chronic cough 09/04/2011  . Sleep apnea, obstructive 09/04/2011    Past Surgical History:  Procedure Laterality Date  . CARDIAC CATHETERIZATION Left 02/22/2016   Procedure: Left Heart Cath and Coronary Angiography;  Surgeon: Yolonda Kida, MD;  Location: [place] [facility];  Service: Cardiovascular;  Laterality: Left;  . COLONOSCOPY WITH PROPOFOL N/A 09/19/2016   Procedure: COLONOSCOPY WITH PROPOFOL;  Surgeon: [name], MD;  Location: [facility] ENDOSCOPY;  Service: Endoscopy;  Laterality: N/A;  . hemorroids    . ORIF FEMUR FRACTURE    . ORIF TIBIA & FIBULA FRACTURES    . OVARY SURGERY      Prior to Admission medications   Medication Sig Start Date End Date Taking? Authorizing Provider  albuterol (PROVENTIL HFA;VENTOLIN HFA) 108 (90 Base) MCG/ACT inhaler Inhale 2 puffs into the lungs every 4 (four) hours as needed for wheezing or shortness of breath. 08/03/15   Demetrios Loll, MD  aspirin EC 81 MG tablet Take 81 mg by mouth daily.    Historical Provider, MD  atorvastatin (LIPITOR) 20 MG tablet Take 20 mg by mouth at bedtime.    Historical Provider, MD  budesonide-formoterol (SYMBICORT) 160-4.5 MCG/ACT inhaler Inhale 2 puffs into the lungs 2 (two) times daily. 05/31/15   Gregor Hams, MD  busPIRone (BUSPAR) 7.5 MG tablet Take 7.5 mg by mouth 3 (three) times daily.     Historical Provider, MD  busPIRone (BUSPAR) 7.5 MG tablet Take by mouth.    Historical Provider, MD  dexlansoprazole (DEXILANT) 60 MG capsule Take 60 mg by mouth daily.    Historical Provider, MD  diazepam (VALIUM) 2 MG tablet Take 1 tablet (2 mg total) by mouth every 6 (six) hours as needed for anxiety. 09/24/16   Loney Hering, MD  docusate sodium (COLACE) 100 MG capsule Take 200 mg by mouth 2 (two) times daily as needed for  mild constipation.    Historical Provider, MD  doxycycline (MONODOX) 100 MG capsule Take by mouth. 08/22/16 11/20/16  Historical Provider, MD  etodolac (LODINE) 200 MG capsule Take 1 capsule (200 mg total) by mouth every 8 (eight) hours. 09/24/16   Loney Hering, MD  gabapentin (NEURONTIN) 300 MG capsule Take 2-3 capsules (600-900 mg total) by mouth 3 (three) times daily. 07/31/16 10/29/16  Milinda Pointer, MD  HYDROcodone-acetaminophen (NORCO) 5-325 MG tablet Take 1 tablet by mouth 3 (three) times daily as needed. 09/18/16   Jenise V Bacon Menshew, [name]  lidocaine (LIDODERM) 5 % Place 1 patch onto the skin every 12 (twelve) hours. Remove & Discard patch within 12 hours or as directed by MD 09/24/16 09/24/17  Loney Hering, MD  linaclotide [facility]) 290 MCG CAPS capsule Take 290 mcg by mouth daily before breakfast.    Historical Provider, MD  lisinopril (PRINIVIL,ZESTRIL) 20 MG tablet Take 40 mg by mouth daily.     Historical Provider, MD  Magnesium Oxide 500 MG CAPS Take 1 capsule (500 mg total) by mouth 2 (two) times daily at 8 am  and 10 pm. 04/24/16   Milinda Pointer, MD  meloxicam (MOBIC) 15 MG tablet Take 1 tablet (15 mg total) by mouth daily. 06/05/16 09/03/16  Milinda Pointer, MD  metoprolol tartrate (LOPRESSOR) 25 MG tablet Take 1 tablet (25 mg total) by mouth 2 (two) times daily. 08/03/15   Demetrios Loll, MD  nitroGLYCERIN (NITROSTAT) 0.4 MG SL tablet Place 1 tablet (0.4 mg total) under the tongue every 5 (five) minutes x 3 doses as needed for chest pain. 08/03/15   Demetrios Loll, MD  ondansetron (ZOFRAN ODT) 4 MG disintegrating tablet Take 1 tablet (4 mg total) by mouth every 8 (eight) hours as needed for nausea or vomiting. 06/13/16   Gregor Hams, MD  ondansetron (ZOFRAN) 4 MG tablet Take 1 tablet (4 mg total) by mouth daily as needed for nausea or vomiting. 08/07/16 08/07/17  Gregor Hams, MD  oxyCODONE-acetaminophen (ROXICET) 5-325 MG tablet Take 1 tablet by mouth every 4 (four) hours as needed  for severe pain. Patient not taking: Reported on 08/28/2016 08/07/16   Gregor Hams, MD  potassium chloride (KLOR-CON) 20 MEQ packet Take 20 mEq by mouth 2 (two) times daily.     Historical Provider, MD  RANEXA 500 MG 12 hr tablet Take 1 tablet (500 mg total) by mouth 2 (two) times daily. 08/03/15   Demetrios Loll, MD  tiZANidine (ZANAFLEX) 4 MG tablet Take 1-2 tablets (4-8 mg total) by mouth every 6 (six) hours as needed for muscle spasms. 07/31/16 10/29/16  Milinda Pointer, MD    Allergies Levofloxacin; Flexeril [cyclobenzaprine]; Keflex [cephalexin]; Ketorolac; Toradol [ketorolac tromethamine]; Tramadol; and Zoloft [sertraline hcl]  Family History  Problem Relation Age of Onset  . Heart disease Mother   . Lung cancer Mother   . Ovarian cancer Mother     Social History Social History  Substance Use Topics  . Smoking status: Current Every Day Smoker    Packs/day: 0.50    Types: Cigarettes  . Smokeless tobacco: Never Used  . Alcohol use 0.0 oz/week     Comment: drinks occasionally    Review of Systems Constitutional: No fever/chills Eyes: No visual changes. ENT: No sore throat. Cardiovascular: Denies chest pain. Respiratory: Denies shortness of breath. Gastrointestinal: No abdominal pain.  No nausea, no vomiting.  No diarrhea.  No constipation. Genitourinary: Negative for dysuria. Musculoskeletal: Left hip and back pain. Skin: Negative for rash. Neurological: Negative for headaches, focal weakness or numbness.  10-point ROS otherwise negative.  ____________________________________________   PHYSICAL EXAM:  VITAL SIGNS: ED Triage Vitals  Enc Vitals Group     BP 09/24/16 0013 126/71     Pulse Rate 09/24/16 0013 88     Resp 09/24/16 0013 20     Temp 09/24/16 0013 98.2 F (36.8 C)     Temp Source 09/24/16 0013 Oral     SpO2 09/24/16 0013 97 %     Weight 09/24/16 0015 200 lb (90.7 kg)     Height 09/24/16 0015 5\' 4"  (1.626 m)     Head Circumference --      Peak Flow --        Pain Score 09/24/16 0015 9     Pain Loc --      Pain Edu? --      Excl. in [place]? --     Constitutional: Alert and oriented. Well appearing and in Moderate distress. Eyes: Conjunctivae are normal. PERRL. EOMI. Head: Atraumatic. Nose: No congestion/rhinnorhea. Mouth/Throat: Mucous membranes are moist.  Oropharynx non-erythematous. Cardiovascular: Normal  rate, regular rhythm. Grossly normal heart sounds.  Good peripheral circulation. Respiratory: Normal respiratory effort.  No retractions. Lungs CTAB. Gastrointestinal: Soft and nontender. No distention. Positive bowel sounds Musculoskeletal: Tenderness to palpation of left hip with some bruising, tenderness to palpation of lower back..   Neurologic:  Normal speech and language.  Skin:  Skin is warm, dry and intact. Bruising to left hip. Psychiatric: Mood and affect are normal.   ____________________________________________   LABS (all labs ordered are listed, but only abnormal results are displayed)  Labs Reviewed - No data to display ____________________________________________  EKG  none ____________________________________________  RADIOLOGY  Left hip xray ____________________________________________   PROCEDURES  Procedure(s) performed: None  Procedures  Critical Care performed: No  ____________________________________________   INITIAL IMPRESSION / ASSESSMENT AND PLAN / ED COURSE  Pertinent labs & imaging results that were available during my care of the patient were reviewed by me and considered in my medical decision making (see chart for details).  This is a 51 year old female who comes into the hospital today with some left hip pain after fall. Also the patient for an x-ray and give her a dose of morphine. The patient does have chronic pain I will attempt to get her pain under control but I may not be able to completely alleviate all of her pain.  Clinical Course as of Sep 25 410  Tue Sep 24, 2016   0209 No acute fracture or dislocation identified. DG Femur Min 2 Views Left [AW]  0209 No acute fracture or dislocation identified. DG Hip Unilat W or Wo Pelvis 2-3 Views Left [AW]    Clinical Course User Index [AW] Loney Hering, MD   The patient is sitting up in bed more than she had been previously. She is still having some pain but her x-rays are negative. She has a stable chronic avulsion fracture of the greater tuberosity of the left femur. The patient does have some bruising and that is likely the cause of her pain. I have encouraged her to ice her hip and use crutches to help her get around for the next few days. The patient needs to follow up with orthopedics  ____________________________________________   FINAL CLINICAL IMPRESSION(S) / ED DIAGNOSES  Final diagnoses:  Pain of left hip joint  Left low back pain, unspecified chronicity, with sciatica presence unspecified      NEW MEDICATIONS STARTED DURING THIS VISIT:  New Prescriptions   DIAZEPAM (VALIUM) 2 MG TABLET    Take 1 tablet (2 mg total) by mouth every 6 (six) hours as needed for anxiety.   ETODOLAC (LODINE) 200 MG CAPSULE    Take 1 capsule (200 mg total) by mouth every 8 (eight) hours.   LIDOCAINE (LIDODERM) 5 %    Place 1 patch onto the skin every 12 (twelve) hours. Remove & Discard patch within 12 hours or as directed by MD     Note:  This document was prepared using Dragon voice recognition software and may include unintentional dictation errors.    Loney Hering, MD 09/24/16 [id number]

## 2016-09-24 NOTE — ED Notes (Signed)
Pt assisted up to room commode; pt able to ambulate but reports increased pain with weight bearing; Dr Dahlia Client notified

## 2016-09-24 NOTE — ED Triage Notes (Addendum)
Pt to room 2 via EMS from home; reports fell up 3 steps, +ETOH, c/o left hip/back pain; denies any other c/o or injuries; 17mcg fentanyl admin by EMS enroute; pt reports that she lost her balance and fell; resp even/unlab, lungs clear, apical audible & regular, +BS, abd soft/nondist/nontender; +PP, -edema

## 2016-09-24 NOTE — ED Notes (Signed)
Pt to xray via stretcher accomp by xray tech 

## 2016-09-25 ENCOUNTER — Ambulatory Visit: Payer: Medicaid Other | Admitting: Pain Medicine

## 2016-09-29 ENCOUNTER — Emergency Department: Payer: Medicaid Other

## 2016-09-29 ENCOUNTER — Encounter: Payer: Self-pay | Admitting: Emergency Medicine

## 2016-09-29 ENCOUNTER — Emergency Department
Admission: EM | Admit: 2016-09-29 | Discharge: 2016-09-29 | Disposition: A | Discharge status: Home / Self Care | Payer: Medicaid Other | Attending: Emergency Medicine | Admitting: Emergency Medicine

## 2016-09-29 DIAGNOSIS — Z7982 Long term (current) use of aspirin: Secondary | ICD-10-CM | POA: Diagnosis not present

## 2016-09-29 DIAGNOSIS — Z79899 Other long term (current) drug therapy: Secondary | ICD-10-CM | POA: Insufficient documentation

## 2016-09-29 DIAGNOSIS — M25552 Pain in left hip: Secondary | ICD-10-CM

## 2016-09-29 DIAGNOSIS — J449 Chronic obstructive pulmonary disease, unspecified: Secondary | ICD-10-CM | POA: Diagnosis not present

## 2016-09-29 DIAGNOSIS — J45909 Unspecified asthma, uncomplicated: Secondary | ICD-10-CM | POA: Insufficient documentation

## 2016-09-29 DIAGNOSIS — F17210 Nicotine dependence, cigarettes, uncomplicated: Secondary | ICD-10-CM | POA: Insufficient documentation

## 2016-09-29 DIAGNOSIS — I2510 Atherosclerotic heart disease of native coronary artery without angina pectoris: Secondary | ICD-10-CM | POA: Diagnosis not present

## 2016-09-29 DIAGNOSIS — I10 Essential (primary) hypertension: Secondary | ICD-10-CM | POA: Insufficient documentation

## 2016-09-29 MED ORDER — TIZANIDINE HCL 4 MG PO TABS: 4.0000 mg | ORAL_TABLET | Freq: Three times a day (TID) | ORAL | 0 refills | Status: DC

## 2016-09-29 MED ORDER — ORPHENADRINE CITRATE 30 MG/ML IJ SOLN
60.0000 mg | Freq: Two times a day (BID) | INTRAMUSCULAR | Status: DC
  Administered 2016-09-29: 60 mg via INTRAMUSCULAR
  Filled 2016-09-29: qty 2

## 2016-09-29 NOTE — Discharge Instructions (Signed)
CT of your hip is negative for injury to the bone and muscle.  Continue the medication prescribed last week and add the zanaflex.  Call the pain clinic to see where you are in the application process and schedule an appointment as soon as possible. You may want to call your primary care provider to schedule an appointment if you require additional pain medication before you can be seen at the pain clinic.  If you have taken ibuprofen in the past without adverse reactions, I recommend taking it every 6 hours in addition to the prescribed medications.  Due to the most recent restrictions on treating pain with narcotic medications, no narcotics will be prescribed today.  Continue to rest and use your walker.

## 2016-09-29 NOTE — ED Notes (Signed)
EDP in room to discuss discharge instructions.

## 2016-09-29 NOTE — ED Provider Notes (Signed)
[facility] Emergency Department Provider Note ____________________________________________  Time seen: Approximately 2:59 PM  I have reviewed the triage vital signs and the nursing notes.   HISTORY  Chief Complaint Hip Pain    HPI Sara Munoz is a 51 y.o. female who presents to the emergency department for evaluation of left hip pain. She states that she fell while going up her front steps on 09/24/16 and landed directly on her left hip. She was evaluated here after the fall and x-rays were negative. She states that the pain has continuously worsened since the fall despite taking the prednisone, valium, and using her walker. She has a history of chronic pain that had been treated by Dr. Dossie Arbour until February. She has now applied for pain management in [place], but hasn't yet heard anything back from them.  Past Medical History:  Diagnosis Date  . Anxiety   . Asthma   . Atypical chest pain 08/02/2015  . CAD (coronary artery disease)   . Chronic back pain   . COPD (chronic obstructive pulmonary disease) ([place])   . Coronary artery disease    a. Mild-non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/2014  . GERD (gastroesophageal reflux disease)   . Hypercholesteremia   . Hypertension   . Hypokalemia   . Hypomagnesemia 01/04/2014  . Liver disease   . MI (myocardial infarction)   . Osteoarthritis     Patient Active Problem List   Diagnosis Date Noted  . Neurogenic pain 07/31/2016  . Chronic pain syndrome 06/05/2016  . Carotid artery stenosis 02/27/2016  . Chronic constipation 02/27/2016  . Chronic nausea 02/27/2016  . Coronary artery disease 02/27/2016  . Hypercholesterolemia 02/27/2016  . Osteoarthritis 02/27/2016  . PTSD (post-traumatic stress disorder) 02/27/2016  . TIA (transient ischemic attack) 02/27/2016  . Long term current use of opiate analgesic 02/27/2016  . Long term prescription opiate use 02/27/2016  . Opiate use 02/27/2016  . Encounter for therapeutic  drug level monitoring 02/27/2016  . Encounter for pain management consult 02/27/2016  . Chronic hip pain (Location of Tertiary source of pain) (Bilateral) (L>R) 02/27/2016  . Chronic knee pain (Bilateral) (L>R) 02/27/2016  . Chronic shoulder pain (Bilateral) (L>R) 02/27/2016  . Chronic sacroiliac joint pain (Bilateral) (L>R) 02/27/2016  . Chronic low back pain (Location of Primary Source of Pain) (Bilateral) (L>R) 02/27/2016  . Chronic lower extremity pain (Location of Secondary source of pain) (Bilateral) (L>R) 02/27/2016  . Osteoarthritis of hip (Bilateral) (L>R) 02/27/2016  . Chronic neck pain (posterior midline) (Bilateral) (L>R) 02/27/2016  . Cervicogenic headache (Bilateral) (L>R) 02/27/2016  . Occipital headache (Bilateral) (L>R) 02/27/2016  . Chronic upper extremity pain (Bilateral) (L>R) 02/27/2016  . Chronic Cervical radicular pain (Bilateral) (L>R) 02/27/2016  . Chronic lumbar radicular pain (Right) (L5 dermatome) 02/27/2016  . Lumbar facet syndrome (Bilateral) (L>R) 02/27/2016  . Long term prescription benzodiazepine use 02/27/2016  . Hypertension   . COPD (chronic obstructive pulmonary disease) ([place]) 10/09/2015  . GERD (gastroesophageal reflux disease) 10/09/2015  . Non-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/2014 08/02/2015  . Hyperlipidemia 08/02/2015  . Costochondritis 08/02/2015  . 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 GAD (generalized anxiety disorder) 09/03/2014  . Narcotic dependence ([place]) 09/03/2014  . PVD (peripheral vascular disease) ([place]) 09/03/2014  . Smoker 09/03/2014  . Cigarette nicotine dependence with nicotine-induced disorder 07/08/2014  . Anemia of chronic disease 03/19/2014  . Narcotic abuse, continuous 03/19/2014  . Polysubstance abuse 03/19/2014  . Hypomagnesemia 01/04/2014  . Chronic cough 09/04/2011  . Sleep apnea,  obstructive 09/04/2011    Past Surgical History:  Procedure Laterality Date  . CARDIAC CATHETERIZATION Left 02/22/2016   Procedure: Left Heart Cath  and Coronary Angiography;  Surgeon: Yolonda Kida, MD;  Location: [facility];  Service: Cardiovascular;  Laterality: Left;  . COLONOSCOPY WITH PROPOFOL N/A 09/19/2016   Procedure: COLONOSCOPY WITH PROPOFOL;  Surgeon: [name], MD;  Location: [facility];  Service: Endoscopy;  Laterality: N/A;  . hemorroids    . ORIF FEMUR FRACTURE    . ORIF TIBIA & FIBULA FRACTURES    . OVARY SURGERY      Prior to Admission medications   Medication Sig Start Date End Date Taking? Authorizing Provider  albuterol (PROVENTIL HFA;VENTOLIN HFA) 108 (90 Base) MCG/ACT inhaler Inhale 2 puffs into the lungs every 4 (four) hours as needed for wheezing or shortness of breath. 08/03/15   Demetrios Loll, MD  aspirin EC 81 MG tablet Take 81 mg by mouth daily.    Historical Provider, MD  atorvastatin (LIPITOR) 20 MG tablet Take 20 mg by mouth at bedtime.    Historical Provider, MD  budesonide-formoterol (SYMBICORT) 160-4.5 MCG/ACT inhaler Inhale 2 puffs into the lungs 2 (two) times daily. 05/31/15   Gregor Hams, MD  busPIRone (BUSPAR) 7.5 MG tablet Take 7.5 mg by mouth 3 (three) times daily.     Historical Provider, MD  busPIRone (BUSPAR) 7.5 MG tablet Take by mouth.    Historical Provider, MD  dexlansoprazole (DEXILANT) 60 MG capsule Take 60 mg by mouth daily.    Historical Provider, MD  diazepam (VALIUM) 2 MG tablet Take 1 tablet (2 mg total) by mouth every 6 (six) hours as needed for anxiety. 09/24/16   Loney Hering, MD  docusate sodium (COLACE) 100 MG capsule Take 200 mg by mouth 2 (two) times daily as needed for mild constipation.    Historical Provider, MD  doxycycline (MONODOX) 100 MG capsule Take by mouth. 08/22/16 11/20/16  Historical Provider, MD  etodolac (LODINE) 200 MG capsule Take 1 capsule (200 mg total) by mouth every 8 (eight) hours. 09/24/16   Loney Hering, MD  gabapentin (NEURONTIN) 300 MG capsule Take 2-3 capsules (600-900 mg total) by mouth 3 (three) times daily. 07/31/16 10/29/16  Milinda Pointer, MD  HYDROcodone-acetaminophen (NORCO) 5-325 MG tablet Take 1 tablet by mouth 3 (three) times daily as needed. 09/18/16   Jenise V Bacon Menshew, [name]  lidocaine (LIDODERM) 5 % Place 1 patch onto the skin every 12 (twelve) hours. Remove & Discard patch within 12 hours or as directed by MD 09/24/16 09/24/17  Loney Hering, MD  linaclotide [facility]) 290 MCG CAPS capsule Take 290 mcg by mouth daily before breakfast.    Historical Provider, MD  lisinopril (PRINIVIL,ZESTRIL) 20 MG tablet Take 40 mg by mouth daily.     Historical Provider, MD  Magnesium Oxide 500 MG CAPS Take 1 capsule (500 mg total) by mouth 2 (two) times daily at 8 am and 10 pm. 04/24/16   Milinda Pointer, MD  meloxicam (MOBIC) 15 MG tablet Take 1 tablet (15 mg total) by mouth daily. 06/05/16 09/03/16  Milinda Pointer, MD  metoprolol tartrate (LOPRESSOR) 25 MG tablet Take 1 tablet (25 mg total) by mouth 2 (two) times daily. 08/03/15   Demetrios Loll, MD  nitroGLYCERIN (NITROSTAT) 0.4 MG SL tablet Place 1 tablet (0.4 mg total) under the tongue every 5 (five) minutes x 3 doses as needed for chest pain. 08/03/15   Demetrios Loll, MD  ondansetron (  ZOFRAN ODT) 4 MG disintegrating tablet Take 1 tablet (4 mg total) by mouth every 8 (eight) hours as needed for nausea or vomiting. 06/13/16   Gregor Hams, MD  ondansetron (ZOFRAN) 4 MG tablet Take 1 tablet (4 mg total) by mouth daily as needed for nausea or vomiting. 08/07/16 08/07/17  Gregor Hams, MD  oxyCODONE-acetaminophen (ROXICET) 5-325 MG tablet Take 1 tablet by mouth every 4 (four) hours as needed for severe pain. Patient not taking: Reported on 08/28/2016 08/07/16   Gregor Hams, MD  potassium chloride (KLOR-CON) 20 MEQ packet Take 20 mEq by mouth 2 (two) times daily.     Historical Provider, MD  RANEXA 500 MG 12 hr tablet Take 1 tablet (500 mg total) by mouth 2 (two) times daily. 08/03/15   Demetrios Loll, MD  tiZANidine (ZANAFLEX) 4 MG tablet Take 1 tablet (4 mg total) by mouth 3 (three)  times daily. 09/29/16   Victorino Dike, FNP    Allergies Levofloxacin; Flexeril [cyclobenzaprine]; Keflex [cephalexin]; Ketorolac; Toradol [ketorolac tromethamine]; Tramadol; and Zoloft [sertraline hcl]  Family History  Problem Relation Age of Onset  . Heart disease Mother   . Lung cancer Mother   . Ovarian cancer Mother     Social History Social History  Substance Use Topics  . Smoking status: Current Every Day Smoker    Packs/day: 0.50    Types: Cigarettes  . Smokeless tobacco: Never Used  . Alcohol use 0.0 oz/week     Comment: drinks occasionally    Review of Systems Constitutional: No recent illness. Cardiovascular: Denies chest pain or palpitations. Respiratory: Denies shortness of breath. Musculoskeletal: Pain in left hip. Skin: Negative for rash, wound, lesion. Neurological: Negative for focal weakness or numbness.  ____________________________________________   PHYSICAL EXAM:  VITAL SIGNS: ED Triage Vitals  Enc Vitals Group     BP 09/29/16 1440 (!) 141/99     Pulse Rate 09/29/16 1440 80     Resp 09/29/16 1440 20     Temp 09/29/16 1440 98.1 F (36.7 C)     Temp Source 09/29/16 1440 Oral     SpO2 09/29/16 1440 95 %     Weight 09/29/16 1441 200 lb (90.7 kg)     Height 09/29/16 1441 5\' 4"  (1.626 m)     Head Circumference --      Peak Flow --      Pain Score 09/29/16 1440 10     Pain Loc --      Pain Edu? --      Excl. in [place]? --     Constitutional: Alert and oriented. Well appearing and in no acute distress. Eyes: Conjunctivae are normal. EOMI. Head: Atraumatic. Neck: No stridor.  Respiratory: Normal respiratory effort.   Musculoskeletal: Pain increases with any movement of the left hip, specifically internal rotation and flexion.  Neurologic:  Normal speech and language. No gross focal neurologic deficits are appreciated. Speech is normal. No gait instability. Skin:  Skin is warm, dry and intact. Ecchymosis noted over the lateral left  hip. Psychiatric: Mood and affect are normal. Speech and behavior are normal.  ____________________________________________   LABS (all labs ordered are listed, but only abnormal results are displayed)  Labs Reviewed - No data to display ____________________________________________  RADIOLOGY  Plain imaging of the left hip completed on 09/24/16 is negative per radiology report.  CT of the left hip today is also negative for acute abnormality per radiology. ____________________________________________   PROCEDURES  Procedure(s) performed: None  ____________________________________________   INITIAL IMPRESSION / ASSESSMENT AND PLAN / ED COURSE  51 year old female presenting to the emergency department for evaluation of left hip pain post fall on 09/24/2016. Evaluation of the left hip via CT scan today does not show any acute abnormalities. Patient is to call the pain management clinic for which she has applied and request an appointment. She was encouraged to call her primary care provider to schedule follow-up appointment if she requires additional medications other than the Zanaflex that was prescribed today. She was also advised to take some ibuprofen,unless she has had a previous allergic reaction, which she states she has not had despite the fact that she lists Toradol as an allergy.  She was also instructed to return to the emergency department for symptoms that change or worsen if she is unable to schedule an appointment. Patient was observed ambulating out of the department with a slightly antalgic gait but without any assistance necessary.  Pertinent labs & imaging results that were available during my care of the patient were reviewed by me and considered in my medical decision making (see chart for details).  _________________________________________   FINAL CLINICAL IMPRESSION(S) / ED DIAGNOSES  Final diagnoses:  Left hip pain    Discharge Medication List as of  09/29/2016  3:52 PM      If controlled substance prescribed during this visit, 12 month history viewed on the [facility] prior to issuing an initial prescription for Schedule II or III opiod.    Victorino Dike, FNP 09/29/16 1606    Delman Kitten, MD 09/29/16 [phone]

## 2016-09-29 NOTE — ED Triage Notes (Signed)
Patient presents to the ED for left hip pain post fall 1 week ago.  Patient reports she was seen at the ED post fall.  Patient reports pain has been getting worse.  Patient is ambulatory to triage with slight limp.  Patient states pain is worse sitting than standing.

## 2016-10-09 ENCOUNTER — Emergency Department: Payer: Medicaid Other

## 2016-10-09 ENCOUNTER — Encounter: Payer: Self-pay | Admitting: Emergency Medicine

## 2016-10-09 DIAGNOSIS — J45909 Unspecified asthma, uncomplicated: Secondary | ICD-10-CM | POA: Insufficient documentation

## 2016-10-09 DIAGNOSIS — Z7982 Long term (current) use of aspirin: Secondary | ICD-10-CM | POA: Diagnosis not present

## 2016-10-09 DIAGNOSIS — R110 Nausea: Secondary | ICD-10-CM | POA: Diagnosis not present

## 2016-10-09 DIAGNOSIS — G8929 Other chronic pain: Secondary | ICD-10-CM | POA: Diagnosis not present

## 2016-10-09 DIAGNOSIS — Z79899 Other long term (current) drug therapy: Secondary | ICD-10-CM | POA: Insufficient documentation

## 2016-10-09 DIAGNOSIS — J209 Acute bronchitis, unspecified: Secondary | ICD-10-CM | POA: Diagnosis not present

## 2016-10-09 DIAGNOSIS — M25552 Pain in left hip: Secondary | ICD-10-CM | POA: Diagnosis not present

## 2016-10-09 DIAGNOSIS — I10 Essential (primary) hypertension: Secondary | ICD-10-CM | POA: Diagnosis not present

## 2016-10-09 DIAGNOSIS — I2510 Atherosclerotic heart disease of native coronary artery without angina pectoris: Secondary | ICD-10-CM | POA: Insufficient documentation

## 2016-10-09 DIAGNOSIS — F17210 Nicotine dependence, cigarettes, uncomplicated: Secondary | ICD-10-CM | POA: Insufficient documentation

## 2016-10-09 DIAGNOSIS — R1013 Epigastric pain: Secondary | ICD-10-CM | POA: Diagnosis not present

## 2016-10-09 DIAGNOSIS — J449 Chronic obstructive pulmonary disease, unspecified: Secondary | ICD-10-CM | POA: Insufficient documentation

## 2016-10-09 DIAGNOSIS — M7062 Trochanteric bursitis, left hip: Secondary | ICD-10-CM | POA: Diagnosis not present

## 2016-10-09 DIAGNOSIS — R05 Cough: Secondary | ICD-10-CM | POA: Diagnosis present

## 2016-10-09 LAB — CBC
HCT: 39.6 % (ref 35.0–47.0)
Hemoglobin: 13.3 g/dL (ref 12.0–16.0)
MCH: 30 pg (ref 26.0–34.0)
MCHC: 33.6 g/dL (ref 32.0–36.0)
MCV: 89.2 fL (ref 80.0–100.0)
Platelets: 247 10*3/uL (ref 150–440)
RBC: 4.44 MIL/uL (ref 3.80–5.20)
RDW: 15.7 % — ABNORMAL HIGH (ref 11.5–14.5)
WBC: 14.6 10*3/uL — ABNORMAL HIGH (ref 3.6–11.0)

## 2016-10-09 LAB — COMPREHENSIVE METABOLIC PANEL
ALT: 62 U/L — ABNORMAL HIGH (ref 14–54)
AST: 82 U/L — ABNORMAL HIGH (ref 15–41)
Albumin: 3.9 g/dL (ref 3.5–5.0)
Alkaline Phosphatase: 53 U/L (ref 38–126)
Anion gap: 12 (ref 5–15)
BUN: 9 mg/dL (ref 6–20)
CO2: 19 mmol/L — ABNORMAL LOW (ref 22–32)
Calcium: 9.1 mg/dL (ref 8.9–10.3)
Chloride: 101 mmol/L (ref 101–111)
Creatinine, Ser: 0.93 mg/dL (ref 0.44–1.00)
GFR calc Af Amer: >60 mL/min (ref >60)
GFR calc non Af Amer: >60 mL/min (ref >60)
Glucose, Bld: 93 mg/dL (ref 65–99)
Potassium: 4.1 mmol/L (ref 3.5–5.1)
Sodium: 132 mmol/L — ABNORMAL LOW (ref 135–145)
Total Bilirubin: 0.3 mg/dL (ref 0.3–1.2)
Total Protein: 7.6 g/dL (ref 6.5–8.1)

## 2016-10-09 LAB — URINALYSIS, COMPLETE (UACMP) WITH MICROSCOPIC
Bilirubin Urine: NEGATIVE
Glucose, UA: NEGATIVE mg/dL
Hgb urine dipstick: NEGATIVE
Ketones, ur: NEGATIVE mg/dL
Leukocytes, UA: NEGATIVE
Nitrite: NEGATIVE
Protein, ur: NEGATIVE mg/dL
Specific Gravity, Urine: 1.005 (ref 1.005–1.030)
pH: 6 (ref 5.0–8.0)

## 2016-10-09 LAB — POCT PREGNANCY, URINE: Preg Test, Ur: NEGATIVE

## 2016-10-09 LAB — LIPASE, BLOOD: Lipase: 36 U/L (ref 11–51)

## 2016-10-09 LAB — TROPONIN I: Troponin I: <0.03 ng/mL (ref <0.03)

## 2016-10-09 NOTE — ED Triage Notes (Addendum)
Pt to triage via w/c with no distress noted, brought in by EMS; pt reports upper abd pain x week accomp by nausea; pt also with frequent congested cough noted

## 2016-10-10 ENCOUNTER — Emergency Department
Admission: EM | Admit: 2016-10-10 | Discharge: 2016-10-10 | Disposition: A | Discharge status: Home / Self Care | Payer: Medicaid Other | Attending: Emergency Medicine | Admitting: Emergency Medicine

## 2016-10-10 DIAGNOSIS — J209 Acute bronchitis, unspecified: Secondary | ICD-10-CM

## 2016-10-10 MED ORDER — ALBUTEROL SULFATE (2.5 MG/3ML) 0.083% IN NEBU
5.0000 mg | INHALATION_SOLUTION | Freq: Once | RESPIRATORY_TRACT | Status: AC
  Administered 2016-10-10: 5 mg via RESPIRATORY_TRACT
  Filled 2016-10-10: qty 6

## 2016-10-10 MED ORDER — HYDROCOD POLST-CPM POLST ER 10-8 MG/5ML PO SUER
5.0000 mL | Freq: Once | ORAL | Status: AC
  Administered 2016-10-10: 5 mL via ORAL
  Filled 2016-10-10: qty 5

## 2016-10-10 MED ORDER — ALBUTEROL SULFATE HFA 108 (90 BASE) MCG/ACT IN AERS: 2.0000 | INHALATION_SPRAY | Freq: Four times a day (QID) | RESPIRATORY_TRACT | 2 refills | Status: DC | PRN

## 2016-10-10 MED ORDER — BENZONATATE 100 MG PO CAPS: 100.0000 mg | ORAL_CAPSULE | Freq: Three times a day (TID) | ORAL | 0 refills | Status: DC | PRN

## 2016-10-10 MED ORDER — PREDNISONE 20 MG PO TABS: 60.0000 mg | ORAL_TABLET | Freq: Every day | ORAL | 0 refills | Status: AC

## 2016-10-10 MED ORDER — AZITHROMYCIN 500 MG PO TABS
500.0000 mg | ORAL_TABLET | Freq: Once | ORAL | Status: AC
  Administered 2016-10-10: 500 mg via ORAL
  Filled 2016-10-10: qty 1

## 2016-10-10 MED ORDER — AZITHROMYCIN 250 MG PO TABS: ORAL_TABLET | ORAL | 0 refills | Status: AC

## 2016-10-12 NOTE — ED Provider Notes (Signed)
[facility] Emergency Department Provider Note    First MD Initiated Contact with Patient 10/10/16 [phone]     (approximate)  I have reviewed the triage vital signs and the nursing notes.   HISTORY  Chief Complaint Abdominal Pain   HPI Sara Munoz is a 51 y.o. female with below list of chronic medical conditions presents to the emergency department with epigastric abdominal pain associated with nausea congestion and cough for one week. Patient denies any fever afebrile on presentation temperature 98.8 patient denies any vomiting or diarrhea. Patient denies any urinary symptoms. Patient states that her pain is consistent with previous chronic abdominal pain. Patient states her current pain score is 9 out of 10.   Past Medical History:  Diagnosis Date  . Anxiety   . Asthma   . Atypical chest pain 08/02/2015  . CAD (coronary artery disease)   . Chronic back pain   . COPD (chronic obstructive pulmonary disease) ([place])   . Coronary artery disease    a. Mild-non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/2014  . GERD (gastroesophageal reflux disease)   . Hypercholesteremia   . Hypertension   . Hypokalemia   . Hypomagnesemia 01/04/2014  . Liver disease   . MI (myocardial infarction) ([place])   . Osteoarthritis     Patient Active Problem List   Diagnosis Date Noted  . Neurogenic pain 07/31/2016  . Chronic pain syndrome 06/05/2016  . Carotid artery stenosis 02/27/2016  . Chronic constipation 02/27/2016  . Chronic nausea 02/27/2016  . Coronary artery disease 02/27/2016  . Hypercholesterolemia 02/27/2016  . Osteoarthritis 02/27/2016  . PTSD (post-traumatic stress disorder) 02/27/2016  . TIA (transient ischemic attack) 02/27/2016  . Long term current use of opiate analgesic 02/27/2016  . Long term prescription opiate use 02/27/2016  . Opiate use 02/27/2016  . Encounter for therapeutic drug level monitoring 02/27/2016  . Encounter for pain management consult 02/27/2016    . Chronic hip pain (Location of Tertiary source of pain) (Bilateral) (L>R) 02/27/2016  . Chronic knee pain (Bilateral) (L>R) 02/27/2016  . Chronic shoulder pain (Bilateral) (L>R) 02/27/2016  . Chronic sacroiliac joint pain (Bilateral) (L>R) 02/27/2016  . Chronic low back pain (Location of Primary Source of Pain) (Bilateral) (L>R) 02/27/2016  . Chronic lower extremity pain (Location of Secondary source of pain) (Bilateral) (L>R) 02/27/2016  . Osteoarthritis of hip (Bilateral) (L>R) 02/27/2016  . Chronic neck pain (posterior midline) (Bilateral) (L>R) 02/27/2016  . Cervicogenic headache (Bilateral) (L>R) 02/27/2016  . Occipital headache (Bilateral) (L>R) 02/27/2016  . Chronic upper extremity pain (Bilateral) (L>R) 02/27/2016  . Chronic Cervical radicular pain (Bilateral) (L>R) 02/27/2016  . Chronic lumbar radicular pain (Right) (L5 dermatome) 02/27/2016  . Lumbar facet syndrome (Bilateral) (L>R) 02/27/2016  . Long term prescription benzodiazepine use 02/27/2016  . Hypertension   . COPD (chronic obstructive pulmonary disease) ([place]) 10/09/2015  . GERD (gastroesophageal reflux disease) 10/09/2015  . Non-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/2014 08/02/2015  . Hyperlipidemia 08/02/2015  . Costochondritis 08/02/2015  . 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 GAD (generalized anxiety disorder) 09/03/2014  . Narcotic dependence ([place]) 09/03/2014  . PVD (peripheral vascular disease) ([place]) 09/03/2014  . Smoker 09/03/2014  . Cigarette nicotine dependence with nicotine-induced disorder 07/08/2014  . Anemia of chronic disease 03/19/2014  . Narcotic abuse, continuous 03/19/2014  . Polysubstance abuse 03/19/2014  . Hypomagnesemia 01/04/2014  . Chronic cough 09/04/2011  . Sleep apnea, obstructive 09/04/2011    Past Surgical History:  Procedure Laterality Date  . CARDIAC CATHETERIZATION  Left 02/22/2016   Procedure: Left Heart Cath and Coronary Angiography;  Surgeon: Yolonda Kida, MD;  Location: [facility];  Service: Cardiovascular;  Laterality: Left;  . COLONOSCOPY WITH PROPOFOL N/A 09/19/2016   Procedure: COLONOSCOPY WITH PROPOFOL;  Surgeon: [name], MD;  Location: [facility] ENDOSCOPY;  Service: Endoscopy;  Laterality: N/A;  . hemorroids    . ORIF FEMUR FRACTURE    . ORIF TIBIA & FIBULA FRACTURES    . OVARY SURGERY      Prior to Admission medications   Medication Sig Start Date End Date Taking? Authorizing Provider  albuterol (PROVENTIL HFA;VENTOLIN HFA) 108 (90 Base) MCG/ACT inhaler Inhale 2 puffs into the lungs every 4 (four) hours as needed for wheezing or shortness of breath. 08/03/15   Demetrios Loll, MD  albuterol (PROVENTIL HFA;VENTOLIN HFA) 108 (90 Base) MCG/ACT inhaler Inhale 2 puffs into the lungs every 6 (six) hours as needed for wheezing or shortness of breath. 10/10/16   Gregor Hams, MD  aspirin EC 81 MG tablet Take 81 mg by mouth daily.    Historical Provider, MD  atorvastatin (LIPITOR) 20 MG tablet Take 20 mg by mouth at bedtime.    Historical Provider, MD  azithromycin (ZITHROMAX Z-PAK) 250 MG tablet Take 2 tablets (500 mg) on  Day 1,  followed by 1 tablet (250 mg) once daily on Days 2 through 5. 10/10/16 10/15/16  Gregor Hams, MD  benzonatate (TESSALON PERLES) 100 MG capsule Take 1 capsule (100 mg total) by mouth 3 (three) times daily as needed for cough. 10/10/16 10/10/17  Gregor Hams, MD  budesonide-formoterol [facility]) 160-4.5 MCG/ACT inhaler Inhale 2 puffs into the lungs 2 (two) times daily. 05/31/15   Gregor Hams, MD  busPIRone (BUSPAR) 7.5 MG tablet Take 7.5 mg by mouth 3 (three) times daily.     Historical Provider, MD  busPIRone (BUSPAR) 7.5 MG tablet Take by mouth.    Historical Provider, MD  dexlansoprazole (DEXILANT) 60 MG capsule Take 60 mg by mouth daily.    Historical Provider, MD  diazepam (VALIUM) 2 MG tablet Take 1 tablet (2 mg total) by mouth every 6 (six) hours as needed for anxiety. 09/24/16   Loney Hering, MD  docusate sodium (COLACE) 100 MG  capsule Take 200 mg by mouth 2 (two) times daily as needed for mild constipation.    Historical Provider, MD  doxycycline (MONODOX) 100 MG capsule Take by mouth. 08/22/16 11/20/16  Historical Provider, MD  etodolac (LODINE) 200 MG capsule Take 1 capsule (200 mg total) by mouth every 8 (eight) hours. 09/24/16   Loney Hering, MD  gabapentin (NEURONTIN) 300 MG capsule Take 2-3 capsules (600-900 mg total) by mouth 3 (three) times daily. 07/31/16 10/29/16  Milinda Pointer, MD  HYDROcodone-acetaminophen (NORCO) 5-325 MG tablet Take 1 tablet by mouth 3 (three) times daily as needed. 09/18/16   Jenise V Bacon Menshew, [name]  lidocaine (LIDODERM) 5 % Place 1 patch onto the skin every 12 (twelve) hours. Remove & Discard patch within 12 hours or as directed by MD 09/24/16 09/24/17  Loney Hering, MD  linaclotide [facility]) 290 MCG CAPS capsule Take 290 mcg by mouth daily before breakfast.    Historical Provider, MD  lisinopril (PRINIVIL,ZESTRIL) 20 MG tablet Take 40 mg by mouth daily.     Historical Provider, MD  Magnesium Oxide 500 MG CAPS Take 1 capsule (500 mg total) by mouth 2 (two) times daily at 8 am and 10 pm. 04/24/16  Milinda Pointer, MD  meloxicam (MOBIC) 15 MG tablet Take 1 tablet (15 mg total) by mouth daily. 06/05/16 09/03/16  Milinda Pointer, MD  metoprolol tartrate (LOPRESSOR) 25 MG tablet Take 1 tablet (25 mg total) by mouth 2 (two) times daily. 08/03/15   Demetrios Loll, MD  nitroGLYCERIN (NITROSTAT) 0.4 MG SL tablet Place 1 tablet (0.4 mg total) under the tongue every 5 (five) minutes x 3 doses as needed for chest pain. 08/03/15   Demetrios Loll, MD  ondansetron (ZOFRAN ODT) 4 MG disintegrating tablet Take 1 tablet (4 mg total) by mouth every 8 (eight) hours as needed for nausea or vomiting. 06/13/16   Gregor Hams, MD  ondansetron (ZOFRAN) 4 MG tablet Take 1 tablet (4 mg total) by mouth daily as needed for nausea or vomiting. 08/07/16 08/07/17  Gregor Hams, MD  oxyCODONE-acetaminophen (ROXICET) 5-325 MG  tablet Take 1 tablet by mouth every 4 (four) hours as needed for severe pain. Patient not taking: Reported on 08/28/2016 08/07/16   Gregor Hams, MD  potassium chloride (KLOR-CON) 20 MEQ packet Take 20 mEq by mouth 2 (two) times daily.     Historical Provider, MD  predniSONE (DELTASONE) 20 MG tablet Take 3 tablets (60 mg total) by mouth daily. 10/10/16 10/15/16  Gregor Hams, MD  RANEXA 500 MG 12 hr tablet Take 1 tablet (500 mg total) by mouth 2 (two) times daily. 08/03/15   Demetrios Loll, MD  tiZANidine (ZANAFLEX) 4 MG tablet Take 1 tablet (4 mg total) by mouth 3 (three) times daily. 09/29/16   Victorino Dike, FNP    Allergies Levofloxacin; Flexeril [cyclobenzaprine]; Keflex [cephalexin]; Ketorolac; Toradol [ketorolac tromethamine]; Tramadol; and Zoloft [sertraline hcl]  Family History  Problem Relation Age of Onset  . Heart disease Mother   . Lung cancer Mother   . Ovarian cancer Mother     Social History Social History  Substance Use Topics  . Smoking status: Current Every Day Smoker    Packs/day: 0.50    Types: Cigarettes  . Smokeless tobacco: Never Used  . Alcohol use 0.0 oz/week     Comment: drinks occasionally    Review of Systems Constitutional: No fever/chills Eyes: No visual changes. ENT: No sore throat.Positive for nasal congestion Cardiovascular: Denies chest pain. Respiratory: Denies shortness of breath.Positive for cough Gastrointestinal: Positive for nominal pain and nausea  Genitourinary: Negative for dysuria. Musculoskeletal: Negative for back pain. Skin: Negative for rash. Neurological: Negative for headaches, focal weakness or numbness.  10-point ROS otherwise negative.  ____________________________________________   PHYSICAL EXAM:  VITAL SIGNS: ED Triage Vitals  Enc Vitals Group     BP 10/09/16 2246 103/80     Pulse Rate 10/09/16 2246 90     Resp 10/09/16 2246 20     Temp 10/09/16 2246 98.8 F (37.1 C)     Temp Source 10/09/16 2246 Oral      SpO2 10/09/16 2246 97 %     Weight 10/09/16 2244 200 lb (90.7 kg)     Height 10/09/16 2244 5\' 4"  (1.626 m)     Head Circumference --      Peak Flow --      Pain Score 10/09/16 2244 10     Pain Loc --      Pain Edu? --      Excl. in [place]? --     Constitutional: Alert and oriented. Well appearing and in no acute distress. Eyes: Conjunctivae are normal. PERRL. EOMI. Head: Atraumatic. Nose: No congestion/rhinnorhea. Mouth/Throat:  Mucous membranes are moist.  Oropharynx non-erythematous. Neck: No stridor.  Cardiovascular: Normal rate, regular rhythm. Good peripheral circulation. Grossly normal heart sounds. Respiratory: Normal respiratory effort.  No retractions. Lungs CTAB. Gastrointestinal: Epigastric tenderness to palpation. No distention.  Musculoskeletal: No lower extremity tenderness nor edema. No gross deformities of extremities. Neurologic:  Normal speech and language. No gross focal neurologic deficits are appreciated.  Skin:  Skin is warm, dry and intact. No rash noted. Psychiatric: Mood and affect are normal. Speech and behavior are normal.  ____________________________________________   LABS (all labs ordered are listed, but only abnormal results are displayed)  Labs Reviewed  COMPREHENSIVE METABOLIC PANEL - Abnormal; Notable for the following:       Result Value   Sodium 132 (*)    CO2 19 (*)    AST 82 (*)    ALT 62 (*)    All other components within normal limits  CBC - Abnormal; Notable for the following:    WBC 14.6 (*)    RDW 15.7 (*)    All other components within normal limits  URINALYSIS, COMPLETE (UACMP) WITH MICROSCOPIC - Abnormal; Notable for the following:    Color, Urine STRAW (*)    APPearance CLEAR (*)    Bacteria, UA RARE (*)    Squamous Epithelial / LPF 0-5 (*)    All other components within normal limits  LIPASE, BLOOD  TROPONIN I  POC URINE PREG, ED  POCT PREGNANCY, URINE   ____________________________________________  EKG  ED ECG  REPORT I, Austin N BROWN, the attending physician, personally viewed and interpreted this ECG.   Date: 10/12/2016  EKG Time: 10:47 PM  Rate: 88  Rhythm: Normal sinus rhythm  Axis: Normal  Intervals: Normal  ST&T Change: None  ___________  Procedures   ____________________________________________   INITIAL IMPRESSION / ASSESSMENT AND PLAN / ED COURSE  Pertinent labs & imaging results that were available during my care of the patient were reviewed by me and considered in my medical decision making (see chart for details).  51 year old female with chronic abdominal pain presenting to the emergency department with symptoms consistent with previous episodes of chronic pain. Patient also admitting to cough and congestion. Regarding patient's abdominal discomfort further imaging not perform this patient has had multiple CT scans performed most recent of which 08/07/2016. Patient given Tussionex albuterol and azithromycin regarding cough and congestion with concern for bronchitis. Patient admits to daily tobacco use.      ____________________________________________  FINAL CLINICAL IMPRESSION(S) / ED DIAGNOSES  Final diagnoses:  Acute bronchitis, unspecified organism     MEDICATIONS GIVEN DURING THIS VISIT:  Medications  chlorpheniramine-HYDROcodone (TUSSIONEX) 10-8 MG/5ML suspension 5 mL (5 mLs Oral Given 10/10/16 0122)  albuterol (PROVENTIL) (2.5 MG/3ML) 0.083% nebulizer solution 5 mg (5 mg Nebulization Given 10/10/16 0122)  azithromycin (ZITHROMAX) tablet 500 mg (500 mg Oral Given 10/10/16 0122)     NEW OUTPATIENT MEDICATIONS STARTED DURING THIS VISIT:  Discharge Medication List as of 10/10/2016  1:50 AM    START taking these medications   Details  !! albuterol (PROVENTIL HFA;VENTOLIN HFA) 108 (90 Base) MCG/ACT inhaler Inhale 2 puffs into the lungs every 6 (six) hours as needed for wheezing or shortness of breath., Starting Thu 10/10/2016, Print    azithromycin  (ZITHROMAX Z-PAK) 250 MG tablet Take 2 tablets (500 mg) on  Day 1,  followed by 1 tablet (250 mg) once daily on Days 2 through 5., Print    benzonatate (TESSALON PERLES) 100 MG capsule Take  1 capsule (100 mg total) by mouth 3 (three) times daily as needed for cough., Starting Thu 10/10/2016, Until Fri 10/10/2017, Print     !! - Potential duplicate medications found. Please discuss with provider.      Discharge Medication List as of 10/10/2016  1:50 AM      Discharge Medication List as of 10/10/2016  1:50 AM       Note:  This document was prepared using Dragon voice recognition software and may include unintentional dictation errors.    Gregor Hams, MD 10/12/16 [phone]

## 2016-10-23 ENCOUNTER — Ambulatory Visit: Payer: Medicaid Other | Attending: Pain Medicine | Admitting: Pain Medicine

## 2017-01-01 ENCOUNTER — Emergency Department: Payer: Medicaid Other

## 2017-01-01 ENCOUNTER — Emergency Department
Admission: EM | Admit: 2017-01-01 | Discharge: 2017-01-01 | Disposition: A | Discharge status: Home / Self Care | Payer: Medicaid Other | Attending: Emergency Medicine | Admitting: Emergency Medicine

## 2017-01-01 DIAGNOSIS — Z7982 Long term (current) use of aspirin: Secondary | ICD-10-CM | POA: Diagnosis not present

## 2017-01-01 DIAGNOSIS — G8929 Other chronic pain: Secondary | ICD-10-CM | POA: Diagnosis not present

## 2017-01-01 DIAGNOSIS — R1013 Epigastric pain: Secondary | ICD-10-CM | POA: Insufficient documentation

## 2017-01-01 DIAGNOSIS — J45909 Unspecified asthma, uncomplicated: Secondary | ICD-10-CM | POA: Diagnosis not present

## 2017-01-01 DIAGNOSIS — F17210 Nicotine dependence, cigarettes, uncomplicated: Secondary | ICD-10-CM | POA: Diagnosis not present

## 2017-01-01 DIAGNOSIS — Z8673 Personal history of transient ischemic attack (TIA), and cerebral infarction without residual deficits: Secondary | ICD-10-CM | POA: Diagnosis not present

## 2017-01-01 DIAGNOSIS — I2510 Atherosclerotic heart disease of native coronary artery without angina pectoris: Secondary | ICD-10-CM | POA: Diagnosis not present

## 2017-01-01 DIAGNOSIS — I10 Essential (primary) hypertension: Secondary | ICD-10-CM | POA: Insufficient documentation

## 2017-01-01 DIAGNOSIS — Z79899 Other long term (current) drug therapy: Secondary | ICD-10-CM | POA: Diagnosis not present

## 2017-01-01 DIAGNOSIS — K219 Gastro-esophageal reflux disease without esophagitis: Secondary | ICD-10-CM | POA: Insufficient documentation

## 2017-01-01 DIAGNOSIS — R079 Chest pain, unspecified: Secondary | ICD-10-CM | POA: Diagnosis present

## 2017-01-01 DIAGNOSIS — J449 Chronic obstructive pulmonary disease, unspecified: Secondary | ICD-10-CM | POA: Insufficient documentation

## 2017-01-01 DIAGNOSIS — E876 Hypokalemia: Secondary | ICD-10-CM | POA: Diagnosis not present

## 2017-01-01 LAB — URINALYSIS, COMPLETE (UACMP) WITH MICROSCOPIC
Bacteria, UA: NONE SEEN
Bilirubin Urine: NEGATIVE
Glucose, UA: NEGATIVE mg/dL
Hgb urine dipstick: NEGATIVE
Ketones, ur: NEGATIVE mg/dL
Nitrite: NEGATIVE
Protein, ur: NEGATIVE mg/dL
Specific Gravity, Urine: 1.014 (ref 1.005–1.030)
pH: 6 (ref 5.0–8.0)

## 2017-01-01 LAB — COMPREHENSIVE METABOLIC PANEL
ALT: 51 U/L (ref 14–54)
AST: 81 U/L — ABNORMAL HIGH (ref 15–41)
Albumin: 3.6 g/dL (ref 3.5–5.0)
Alkaline Phosphatase: 67 U/L (ref 38–126)
Anion gap: 12 (ref 5–15)
BUN: 7 mg/dL (ref 6–20)
CO2: 25 mmol/L (ref 22–32)
Calcium: 9.9 mg/dL (ref 8.9–10.3)
Chloride: 101 mmol/L (ref 101–111)
Creatinine, Ser: 0.82 mg/dL (ref 0.44–1.00)
GFR calc Af Amer: >60 mL/min (ref >60)
GFR calc non Af Amer: >60 mL/min (ref >60)
Glucose, Bld: 127 mg/dL — ABNORMAL HIGH (ref 65–99)
Potassium: 2.9 mmol/L — ABNORMAL LOW (ref 3.5–5.1)
Sodium: 138 mmol/L (ref 135–145)
Total Bilirubin: 0.4 mg/dL (ref 0.3–1.2)
Total Protein: 7.3 g/dL (ref 6.5–8.1)

## 2017-01-01 LAB — CBC
HCT: 41.4 % (ref 35.0–47.0)
Hemoglobin: 14.1 g/dL (ref 12.0–16.0)
MCH: 28.8 pg (ref 26.0–34.0)
MCHC: 34.2 g/dL (ref 32.0–36.0)
MCV: 84.3 fL (ref 80.0–100.0)
Platelets: 231 10*3/uL (ref 150–440)
RBC: 4.9 MIL/uL (ref 3.80–5.20)
RDW: 16.3 % — ABNORMAL HIGH (ref 11.5–14.5)
WBC: 9.8 10*3/uL (ref 3.6–11.0)

## 2017-01-01 LAB — TROPONIN I: Troponin I: <0.03 ng/mL (ref <0.03)

## 2017-01-01 LAB — LIPASE, BLOOD: Lipase: 49 U/L (ref 11–51)

## 2017-01-01 MED ORDER — FAMOTIDINE 20 MG PO TABS: 20.0000 mg | ORAL_TABLET | Freq: Two times a day (BID) | ORAL | 0 refills | Status: DC

## 2017-01-01 MED ORDER — HYDROCODONE-ACETAMINOPHEN 5-325 MG PO TABS
1.0000 | ORAL_TABLET | Freq: Once | ORAL | Status: AC
  Administered 2017-01-01: 1 via ORAL
  Filled 2017-01-01: qty 1

## 2017-01-01 MED ORDER — METOCLOPRAMIDE HCL 5 MG/ML IJ SOLN
10.0000 mg | Freq: Once | INTRAMUSCULAR | Status: AC
  Administered 2017-01-01: 10 mg via INTRAVENOUS
  Filled 2017-01-01: qty 2

## 2017-01-01 MED ORDER — GI COCKTAIL ~~LOC~~
30.0000 mL | ORAL | Status: AC
  Administered 2017-01-01: 30 mL via ORAL
  Filled 2017-01-01: qty 30

## 2017-01-01 MED ORDER — POTASSIUM CHLORIDE CRYS ER 20 MEQ PO TBCR
60.0000 meq | EXTENDED_RELEASE_TABLET | Freq: Once | ORAL | Status: AC
  Administered 2017-01-01: 60 meq via ORAL
  Filled 2017-01-01: qty 3

## 2017-01-01 MED ORDER — FAMOTIDINE 20 MG PO TABS
40.0000 mg | ORAL_TABLET | Freq: Once | ORAL | Status: AC
  Administered 2017-01-01: 40 mg via ORAL
  Filled 2017-01-01: qty 2

## 2017-01-01 MED ORDER — METOCLOPRAMIDE HCL 10 MG PO TABS: 10.0000 mg | ORAL_TABLET | Freq: Four times a day (QID) | ORAL | 0 refills | Status: DC | PRN

## 2017-01-01 NOTE — Discharge Instructions (Signed)
Results for orders placed or performed during the hospital encounter of 01/01/17  CBC  Result Value Ref Range   WBC 9.8 3.6 - 11.0 K/uL   RBC 4.90 3.80 - 5.20 MIL/uL   Hemoglobin 14.1 12.0 - 16.0 g/dL   HCT 41.4 35.0 - 47.0 %   MCV 84.3 80.0 - 100.0 fL   MCH 28.8 26.0 - 34.0 pg   MCHC 34.2 32.0 - 36.0 g/dL   RDW 16.3 (H) 11.5 - 14.5 %   Platelets 231 150 - 440 K/uL  Comprehensive metabolic panel  Result Value Ref Range   Sodium 138 135 - 145 mmol/L   Potassium 2.9 (L) 3.5 - 5.1 mmol/L   Chloride 101 101 - 111 mmol/L   CO2 25 22 - 32 mmol/L   Glucose, Bld 127 (H) 65 - 99 mg/dL   BUN 7 6 - 20 mg/dL   Creatinine, Ser 0.82 0.44 - 1.00 mg/dL   Calcium 9.9 8.9 - 10.3 mg/dL   Total Protein 7.3 6.5 - 8.1 g/dL   Albumin 3.6 3.5 - 5.0 g/dL   AST 81 (H) 15 - 41 U/L   ALT 51 14 - 54 U/L   Alkaline Phosphatase 67 38 - 126 U/L   Total Bilirubin 0.4 0.3 - 1.2 mg/dL   GFR calc non Af Amer >60 >60 mL/min   GFR calc Af Amer >60 >60 mL/min   Anion gap 12 5 - 15  Lipase, blood  Result Value Ref Range   Lipase 49 11 - 51 U/L  Urinalysis, Complete w Microscopic  Result Value Ref Range   Color, Urine YELLOW (A) YELLOW   APPearance HAZY (A) CLEAR   Specific Gravity, Urine 1.014 1.005 - 1.030   pH 6.0 5.0 - 8.0   Glucose, UA NEGATIVE NEGATIVE mg/dL   Hgb urine dipstick NEGATIVE NEGATIVE   Bilirubin Urine NEGATIVE NEGATIVE   Ketones, ur NEGATIVE NEGATIVE mg/dL   Protein, ur NEGATIVE NEGATIVE mg/dL   Nitrite NEGATIVE NEGATIVE   Leukocytes, UA MODERATE (A) NEGATIVE   RBC / HPF 0-5 0 - 5 RBC/hpf   WBC, UA 6-30 0 - 5 WBC/hpf   Bacteria, UA NONE SEEN NONE SEEN   Squamous Epithelial / LPF 6-30 (A) NONE SEEN   Mucous PRESENT    Amorphous Crystal PRESENT   Troponin I  Result Value Ref Range   Troponin I <0.03 <0.03 ng/mL   Dg Chest 2 View  Result Date: 01/01/2017 CLINICAL DATA:  Chest pain for 3 days. EXAM: CHEST  2 VIEW COMPARISON:  10/10/2018 FINDINGS: The cardiomediastinal contours are  normal. Mild central bronchitic changes which are stable. Pulmonary vasculature is normal. No consolidation, pleural effusion, or pneumothorax. No acute osseous abnormalities are seen. IMPRESSION: No acute pulmonary process. Electronically Signed   By: Jeb Levering M.D.   On: 01/01/2017 20:00

## 2017-01-01 NOTE — ED Triage Notes (Signed)
Pt in with co chest pain x 3 days states with some shob and diaphoresis. None noted at this time, states has not taken any of her meds for 3 days. Hx of MI in the past, also co LLQ pain hx of pancreatitis.

## 2017-01-01 NOTE — ED Provider Notes (Signed)
[facility] Emergency Department Provider Note  ____________________________________________  Time seen: Approximately 10:01 PM  I have reviewed the triage vital signs and the nursing notes.   HISTORY  Chief Complaint Chest Pain    HPI Sara Munoz is a 51 y.o. female who complains of chest pain according to the triage note, but she indicates the epigastrium. This is been hurting constantly for 3 days, waxing and waning, radiating around to the lower back. To me she denies shortness of breath or diaphoresis but endorses nausea and vomiting. Also reports diarrhea. Not exertional, not pleuritic. Not positional. No aggravating or alleviating factors. Severe. Pain has come on in the setting of running out of many of her medications including her PPI, Vicodin, Lipitor, and blood pressure medicine. She has refills and is going to take them all up on Friday in 2 days.     Past Medical History:  Diagnosis Date  . Anxiety   . Asthma   . Atypical chest pain 08/02/2015  . CAD (coronary artery disease)   . Chronic back pain   . COPD (chronic obstructive pulmonary disease) ([place])   . Coronary artery disease    a. Mild-non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/2014  . GERD (gastroesophageal reflux disease)   . Hypercholesteremia   . Hypertension   . Hypokalemia   . Hypomagnesemia 01/04/2014  . Liver disease   . MI (myocardial infarction) ([place])   . Osteoarthritis      Patient Active Problem List   Diagnosis Date Noted  . Neurogenic pain 07/31/2016  . Chronic pain syndrome 06/05/2016  . Carotid artery stenosis 02/27/2016  . Chronic constipation 02/27/2016  . Chronic nausea 02/27/2016  . Coronary artery disease 02/27/2016  . Hypercholesterolemia 02/27/2016  . Osteoarthritis 02/27/2016  . PTSD (post-traumatic stress disorder) 02/27/2016  . TIA (transient ischemic attack) 02/27/2016  . Long term current use of opiate analgesic 02/27/2016  . Long term prescription  opiate use 02/27/2016  . Opiate use 02/27/2016  . Encounter for therapeutic drug level monitoring 02/27/2016  . Encounter for pain management consult 02/27/2016  . Chronic hip pain (Location of Tertiary source of pain) (Bilateral) (L>R) 02/27/2016  . Chronic knee pain (Bilateral) (L>R) 02/27/2016  . Chronic shoulder pain (Bilateral) (L>R) 02/27/2016  . Chronic sacroiliac joint pain (Bilateral) (L>R) 02/27/2016  . Chronic low back pain (Location of Primary Source of Pain) (Bilateral) (L>R) 02/27/2016  . Chronic lower extremity pain (Location of Secondary source of pain) (Bilateral) (L>R) 02/27/2016  . Osteoarthritis of hip (Bilateral) (L>R) 02/27/2016  . Chronic neck pain (posterior midline) (Bilateral) (L>R) 02/27/2016  . Cervicogenic headache (Bilateral) (L>R) 02/27/2016  . Occipital headache (Bilateral) (L>R) 02/27/2016  . Chronic upper extremity pain (Bilateral) (L>R) 02/27/2016  . Chronic Cervical radicular pain (Bilateral) (L>R) 02/27/2016  . Chronic lumbar radicular pain (Right) (L5 dermatome) 02/27/2016  . Lumbar facet syndrome (Bilateral) (L>R) 02/27/2016  . Long term prescription benzodiazepine use 02/27/2016  . Hypertension   . COPD (chronic obstructive pulmonary disease) ([place]) 10/09/2015  . GERD (gastroesophageal reflux disease) 10/09/2015  . Non-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/2014 08/02/2015  . Hyperlipidemia 08/02/2015  . Costochondritis 08/02/2015  . 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 GAD (generalized anxiety disorder) 09/03/2014  . Narcotic dependence ([place]) 09/03/2014  . PVD (peripheral vascular disease) ([place]) 09/03/2014  . Smoker 09/03/2014  . Cigarette nicotine dependence with nicotine-induced disorder 07/08/2014  . Anemia of chronic disease 03/19/2014  . Narcotic abuse, continuous 03/19/2014  . Polysubstance abuse 03/19/2014  .  Hypomagnesemia 01/04/2014  . Chronic cough 09/04/2011  . Sleep apnea, obstructive 09/04/2011     Past Surgical History:  Procedure  Laterality Date  . CARDIAC CATHETERIZATION Left 02/22/2016   Procedure: Left Heart Cath and Coronary Angiography;  Surgeon: Yolonda Kida, MD;  Location: [place] CV LAB;  Service: Cardiovascular;  Laterality: Left;  . COLONOSCOPY WITH PROPOFOL N/A 09/19/2016   Procedure: COLONOSCOPY WITH PROPOFOL;  Surgeon: [name], MD;  Location: [facility] ENDOSCOPY;  Service: Endoscopy;  Laterality: N/A;  . hemorroids    . ORIF FEMUR FRACTURE    . ORIF TIBIA & FIBULA FRACTURES    . OVARY SURGERY       Prior to Admission medications   Medication Sig Start Date End Date Taking? Authorizing Provider  albuterol (PROVENTIL HFA;VENTOLIN HFA) 108 (90 Base) MCG/ACT inhaler Inhale 2 puffs into the lungs every 4 (four) hours as needed for wheezing or shortness of breath. 08/03/15   Demetrios Loll, MD  albuterol (PROVENTIL HFA;VENTOLIN HFA) [phone] Base) MCG/ACT inhaler Inhale 2 puffs into the lungs every 6 (six) hours as needed for wheezing or shortness of breath. 10/10/16   Gregor Hams, MD  aspirin EC 81 MG tablet Take 81 mg by mouth daily.    [provider]  atorvastatin (LIPITOR) 20 MG tablet Take 20 mg by mouth at bedtime.    [provider]  benzonatate (TESSALON PERLES) 100 MG capsule Take 1 capsule (100 mg total) by mouth 3 (three) times daily as needed for cough. 10/10/16 10/10/17  Gregor Hams, MD  budesonide-formoterol [facility]) 160-4.5 MCG/ACT inhaler Inhale 2 puffs into the lungs 2 (two) times daily. 05/31/15   Gregor Hams, MD  busPIRone (BUSPAR) 7.5 MG tablet Take 7.5 mg by mouth 3 (three) times daily.     [provider]  busPIRone (BUSPAR) 7.5 MG tablet Take by mouth.    [provider]  dexlansoprazole (DEXILANT) 60 MG capsule Take 60 mg by mouth daily.    [provider]  diazepam (VALIUM) 2 MG tablet Take 1 tablet (2 mg total) by mouth every 6 (six) hours as needed for anxiety. 09/24/16   Loney Hering, MD  docusate sodium (COLACE) 100  MG capsule Take 200 mg by mouth 2 (two) times daily as needed for mild constipation.    [provider]  etodolac (LODINE) 200 MG capsule Take 1 capsule (200 mg total) by mouth every 8 (eight) hours. 09/24/16   Loney Hering, MD  famotidine (PEPCID) 20 MG tablet Take 1 tablet (20 mg total) by mouth 2 (two) times daily. 01/01/17   Carrie Mew, MD  gabapentin (NEURONTIN) 300 MG capsule Take 2-3 capsules (600-900 mg total) by mouth 3 (three) times daily. 07/31/16 10/29/16  Milinda Pointer, MD  HYDROcodone-acetaminophen (NORCO) 5-325 MG tablet Take 1 tablet by mouth 3 (three) times daily as needed. 09/18/16   Menshew, Dannielle Karvonen, [name]  lidocaine (LIDODERM) 5 % Place 1 patch onto the skin every 12 (twelve) hours. Remove & Discard patch within 12 hours or as directed by MD 09/24/16 09/24/17  Loney Hering, MD  linaclotide (LINZESS) 290 MCG CAPS capsule Take 290 mcg by mouth daily before breakfast.    [provider]  lisinopril (PRINIVIL,ZESTRIL) 20 MG tablet Take 40 mg by mouth daily.     [provider]  Magnesium Oxide 500 MG CAPS Take 1 capsule (500 mg total) by mouth 2 (two) times daily at 8 am and 10 pm. 04/24/16  Milinda Pointer, MD  meloxicam (MOBIC) 15 MG tablet Take 1 tablet (15 mg total) by mouth daily. 06/05/16 09/03/16  Milinda Pointer, MD  metoCLOPramide (REGLAN) 10 MG tablet Take 1 tablet (10 mg total) by mouth every 6 (six) hours as needed. 01/01/17   Carrie Mew, MD  metoprolol tartrate (LOPRESSOR) 25 MG tablet Take 1 tablet (25 mg total) by mouth 2 (two) times daily. 08/03/15   Demetrios Loll, MD  nitroGLYCERIN (NITROSTAT) 0.4 MG SL tablet Place 1 tablet (0.4 mg total) under the tongue every 5 (five) minutes x 3 doses as needed for chest pain. 08/03/15   Demetrios Loll, MD  ondansetron (ZOFRAN ODT) 4 MG disintegrating tablet Take 1 tablet (4 mg total) by mouth every 8 (eight) hours as needed for nausea or vomiting. 06/13/16   Gregor Hams, MD   ondansetron [facility]) 4 MG tablet Take 1 tablet (4 mg total) by mouth daily as needed for nausea or vomiting. 08/07/16 08/07/17  Gregor Hams, MD  oxyCODONE-acetaminophen (ROXICET) 5-325 MG tablet Take 1 tablet by mouth every 4 (four) hours as needed for severe pain. Patient not taking: Reported on 08/28/2016 08/07/16   Gregor Hams, MD  potassium chloride (KLOR-CON) 20 MEQ packet Take 20 mEq by mouth 2 (two) times daily.     [provider]  RANEXA 500 MG 12 hr tablet Take 1 tablet (500 mg total) by mouth 2 (two) times daily. 08/03/15   Demetrios Loll, MD  tiZANidine (ZANAFLEX) 4 MG tablet Take 1 tablet (4 mg total) by mouth 3 (three) times daily. 09/29/16   Triplett, Johnette Abraham B, FNP     Allergies Levofloxacin; Flexeril [cyclobenzaprine]; Keflex [cephalexin]; Ketorolac; Toradol [ketorolac tromethamine]; Tramadol; and Zoloft [sertraline hcl]   Family History  Problem Relation Age of Onset  . Heart disease Mother   . Lung cancer Mother   . Ovarian cancer Mother     Social History Social History  Substance Use Topics  . Smoking status: Current Every Day Smoker    Packs/day: 0.50    Types: Cigarettes  . Smokeless tobacco: Never Used  . Alcohol use 0.0 oz/week     Comment: drinks occasionally    Review of Systems  Constitutional:   No fever or chills.  ENT:   No sore throat. No rhinorrhea. Cardiovascular:   No chest pain or syncope. Respiratory:   No dyspnea or cough. Gastrointestinal:   Positive as above for upper abdominal pain with vomiting and diarrhea.  Musculoskeletal:   Negative for focal pain or swelling All other systems reviewed and are negative except as documented above in ROS and HPI.  ____________________________________________   PHYSICAL EXAM:  VITAL SIGNS: ED Triage Vitals  Enc Vitals Group     BP 01/01/17 1938 (!) 174/123     Pulse Rate 01/01/17 1938 (!) 112     Resp 01/01/17 1938 20     Temp 01/01/17 1938 98.6 F (37 C)     Temp Source 01/01/17  1938 Oral     SpO2 01/01/17 1938 93 %     Weight 01/01/17 1933 18 lb (8.165 kg)     Height 01/01/17 1933 5\' 4"  (1.626 m)     Head Circumference --      Peak Flow --      Pain Score 01/01/17 1932 9     Pain Loc --      Pain Edu? --      Excl. in [place]? --     Vital signs reviewed,  nursing assessments reviewed.   Constitutional:   Alert and oriented. Well appearing and in no distress. Eyes:   No scleral icterus.  EOMI. No nystagmus. No conjunctival pallor. PERRL. ENT   Head:   Normocephalic and atraumatic.   Nose:   No congestion/rhinnorhea.    Mouth/Throat:   MMM, no pharyngeal erythema. No peritonsillar mass.    Neck:   No meningismus. Full ROM Hematological/Lymphatic/Immunilogical:   No cervical lymphadenopathy. Cardiovascular:   RRR. Symmetric bilateral radial and DP pulses.  No murmurs.  Respiratory:   Normal respiratory effort without tachypnea/retractions. Breath sounds are clear and equal bilaterally. No wheezes/rales/rhonchi. Gastrointestinal:   Soft With tenderness in left upper quadrant and epigastrium. Non distended. There is no CVA tenderness.  No rebound, rigidity, or guarding. Genitourinary:   deferred Musculoskeletal:   Normal range of motion in all extremities. No joint effusions.  No lower extremity tenderness.  No edema. Neurologic:   Normal speech and language.  Motor grossly intact. No gross focal neurologic deficits are appreciated.  Skin:    Skin is warm, dry and intact. No rash noted.  No petechiae, purpura, or bullae.  ____________________________________________    LABS (pertinent positives/negatives) (all labs ordered are listed, but only abnormal results are displayed) Labs Reviewed  CBC - Abnormal; Notable for the following:       Result Value   RDW 16.3 (*)    All other components within normal limits  COMPREHENSIVE METABOLIC PANEL - Abnormal; Notable for the following:    Potassium 2.9 (*)    Glucose, Bld 127 (*)    AST 81 (*)     All other components within normal limits  URINALYSIS, COMPLETE (UACMP) WITH MICROSCOPIC - Abnormal; Notable for the following:    Color, Urine YELLOW (*)    APPearance HAZY (*)    Leukocytes, UA MODERATE (*)    Squamous Epithelial / LPF 6-30 (*)    All other components within normal limits  URINE CULTURE  LIPASE, BLOOD  TROPONIN I   ____________________________________________   EKG  Interpreted by me Sinus tachycardia rate 123, normal axis intervals QRS ST segments and T waves.  ____________________________________________    RADIOLOGY  Dg Chest 2 View  Result Date: 01/01/2017 CLINICAL DATA:  Chest pain for 3 days. EXAM: CHEST  2 VIEW COMPARISON:  10/10/2018 FINDINGS: The cardiomediastinal contours are normal. Mild central bronchitic changes which are stable. Pulmonary vasculature is normal. No consolidation, pleural effusion, or pneumothorax. No acute osseous abnormalities are seen. IMPRESSION: No acute pulmonary process. Electronically Signed   By: Jeb Levering M.D.   On: 01/01/2017 20:00    ____________________________________________   PROCEDURES Procedures  ____________________________________________   INITIAL IMPRESSION / ASSESSMENT AND PLAN / ED COURSE  Pertinent labs & imaging results that were available during my care of the patient were reviewed by me and considered in my medical decision making (see chart for details).  Patient presents with upper abdominal pain, consistent with gastritis and GERD. Labs are unremarkable, no evidence of biliary disease or pancreatitis. Low suspicion for AAA bowel perforation or obstruction. Very low suspicion for ACS PE dissection pneumothorax or pneumonia. Patient feeling better after receiving antiacids and a dose of her Norco that she takes at home. Patient reports being ready to be discharged from the hospital, she is well-appearing, not in distress, and suitable for outpatient follow-up. Agree with refilling and  restarting all her medications. Likely this has an element of PPI withdrawals to it. She understands we are unable to  refill her chronic opioid medication.      ____________________________________________   FINAL CLINICAL IMPRESSION(S) / ED DIAGNOSES  Final diagnoses:  Epigastric pain  Gastroesophageal reflux disease, esophagitis presence not specified  Hypokalemia      New Prescriptions   FAMOTIDINE (PEPCID) 20 MG TABLET    Take 1 tablet (20 mg total) by mouth 2 (two) times daily.   METOCLOPRAMIDE (REGLAN) 10 MG TABLET    Take 1 tablet (10 mg total) by mouth every 6 (six) hours as needed.     Portions of this note were generated with dragon dictation software. Dictation errors may occur despite best attempts at proofreading.    Carrie Mew, MD 01/01/17 2204

## 2017-01-03 LAB — URINE CULTURE: Culture: <10000 — AB

## 2017-01-11 ENCOUNTER — Other Ambulatory Visit: Payer: Self-pay | Admitting: Pain Medicine

## 2017-01-11 DIAGNOSIS — M792 Neuralgia and neuritis, unspecified: Secondary | ICD-10-CM

## 2017-01-14 ENCOUNTER — Encounter: Payer: Self-pay | Admitting: Emergency Medicine

## 2017-01-14 DIAGNOSIS — R109 Unspecified abdominal pain: Secondary | ICD-10-CM | POA: Diagnosis not present

## 2017-01-14 LAB — COMPREHENSIVE METABOLIC PANEL
ALT: 82 U/L — ABNORMAL HIGH (ref 14–54)
AST: 134 U/L — ABNORMAL HIGH (ref 15–41)
Albumin: 4.1 g/dL (ref 3.5–5.0)
Alkaline Phosphatase: 70 U/L (ref 38–126)
Anion gap: 11 (ref 5–15)
BUN: 7 mg/dL (ref 6–20)
CO2: 21 mmol/L — ABNORMAL LOW (ref 22–32)
Calcium: 9.4 mg/dL (ref 8.9–10.3)
Chloride: 107 mmol/L (ref 101–111)
Creatinine, Ser: 0.62 mg/dL (ref 0.44–1.00)
GFR calc Af Amer: >60 mL/min (ref >60)
GFR calc non Af Amer: >60 mL/min (ref >60)
Glucose, Bld: 97 mg/dL (ref 65–99)
Potassium: 3.8 mmol/L (ref 3.5–5.1)
Sodium: 139 mmol/L (ref 135–145)
Total Bilirubin: 0.4 mg/dL (ref 0.3–1.2)
Total Protein: 8 g/dL (ref 6.5–8.1)

## 2017-01-14 LAB — URINALYSIS, COMPLETE (UACMP) WITH MICROSCOPIC
Bilirubin Urine: NEGATIVE
Glucose, UA: NEGATIVE mg/dL
Hgb urine dipstick: NEGATIVE
Ketones, ur: NEGATIVE mg/dL
Leukocytes, UA: NEGATIVE
Nitrite: NEGATIVE
Protein, ur: NEGATIVE mg/dL
Specific Gravity, Urine: 1.001 — ABNORMAL LOW (ref 1.005–1.030)
pH: 6 (ref 5.0–8.0)

## 2017-01-14 LAB — LIPASE, BLOOD: Lipase: 51 U/L (ref 11–51)

## 2017-01-14 LAB — CBC
HCT: 43.1 % (ref 35.0–47.0)
Hemoglobin: 14.4 g/dL (ref 12.0–16.0)
MCH: 28.5 pg (ref 26.0–34.0)
MCHC: 33.5 g/dL (ref 32.0–36.0)
MCV: 84.9 fL (ref 80.0–100.0)
Platelets: 213 10*3/uL (ref 150–440)
RBC: 5.07 MIL/uL (ref 3.80–5.20)
RDW: 17.1 % — ABNORMAL HIGH (ref 11.5–14.5)
WBC: 8.6 10*3/uL (ref 3.6–11.0)

## 2017-01-14 LAB — TROPONIN I: Troponin I: <0.03 ng/mL (ref <0.03)

## 2017-01-14 LAB — POCT PREGNANCY, URINE: Preg Test, Ur: NEGATIVE

## 2017-01-14 NOTE — ED Triage Notes (Signed)
Patient ambulatory to triage with steady gait, without difficulty or distress noted; pt reports having right upper abd pain radiating around into back accomp by nausea and diarrhea

## 2017-01-15 ENCOUNTER — Emergency Department
Admission: EM | Admit: 2017-01-15 | Discharge: 2017-01-15 | Disposition: A | Discharge status: Home / Self Care | Payer: Medicaid Other | Attending: Emergency Medicine | Admitting: Emergency Medicine

## 2017-02-01 IMAGING — CR DG CHEST 2V
1 series · 2 of 2 positions shown · non-contrast
Comparison: Prior study from 06/07/2015.

CLINICAL DATA: Initial evaluation for acute chest pain.

EXAM:
CHEST  2 VIEW

[Series 3: w chest pa · 0.14mm/px · 2 of 2 slices shown]
[im 1/2]
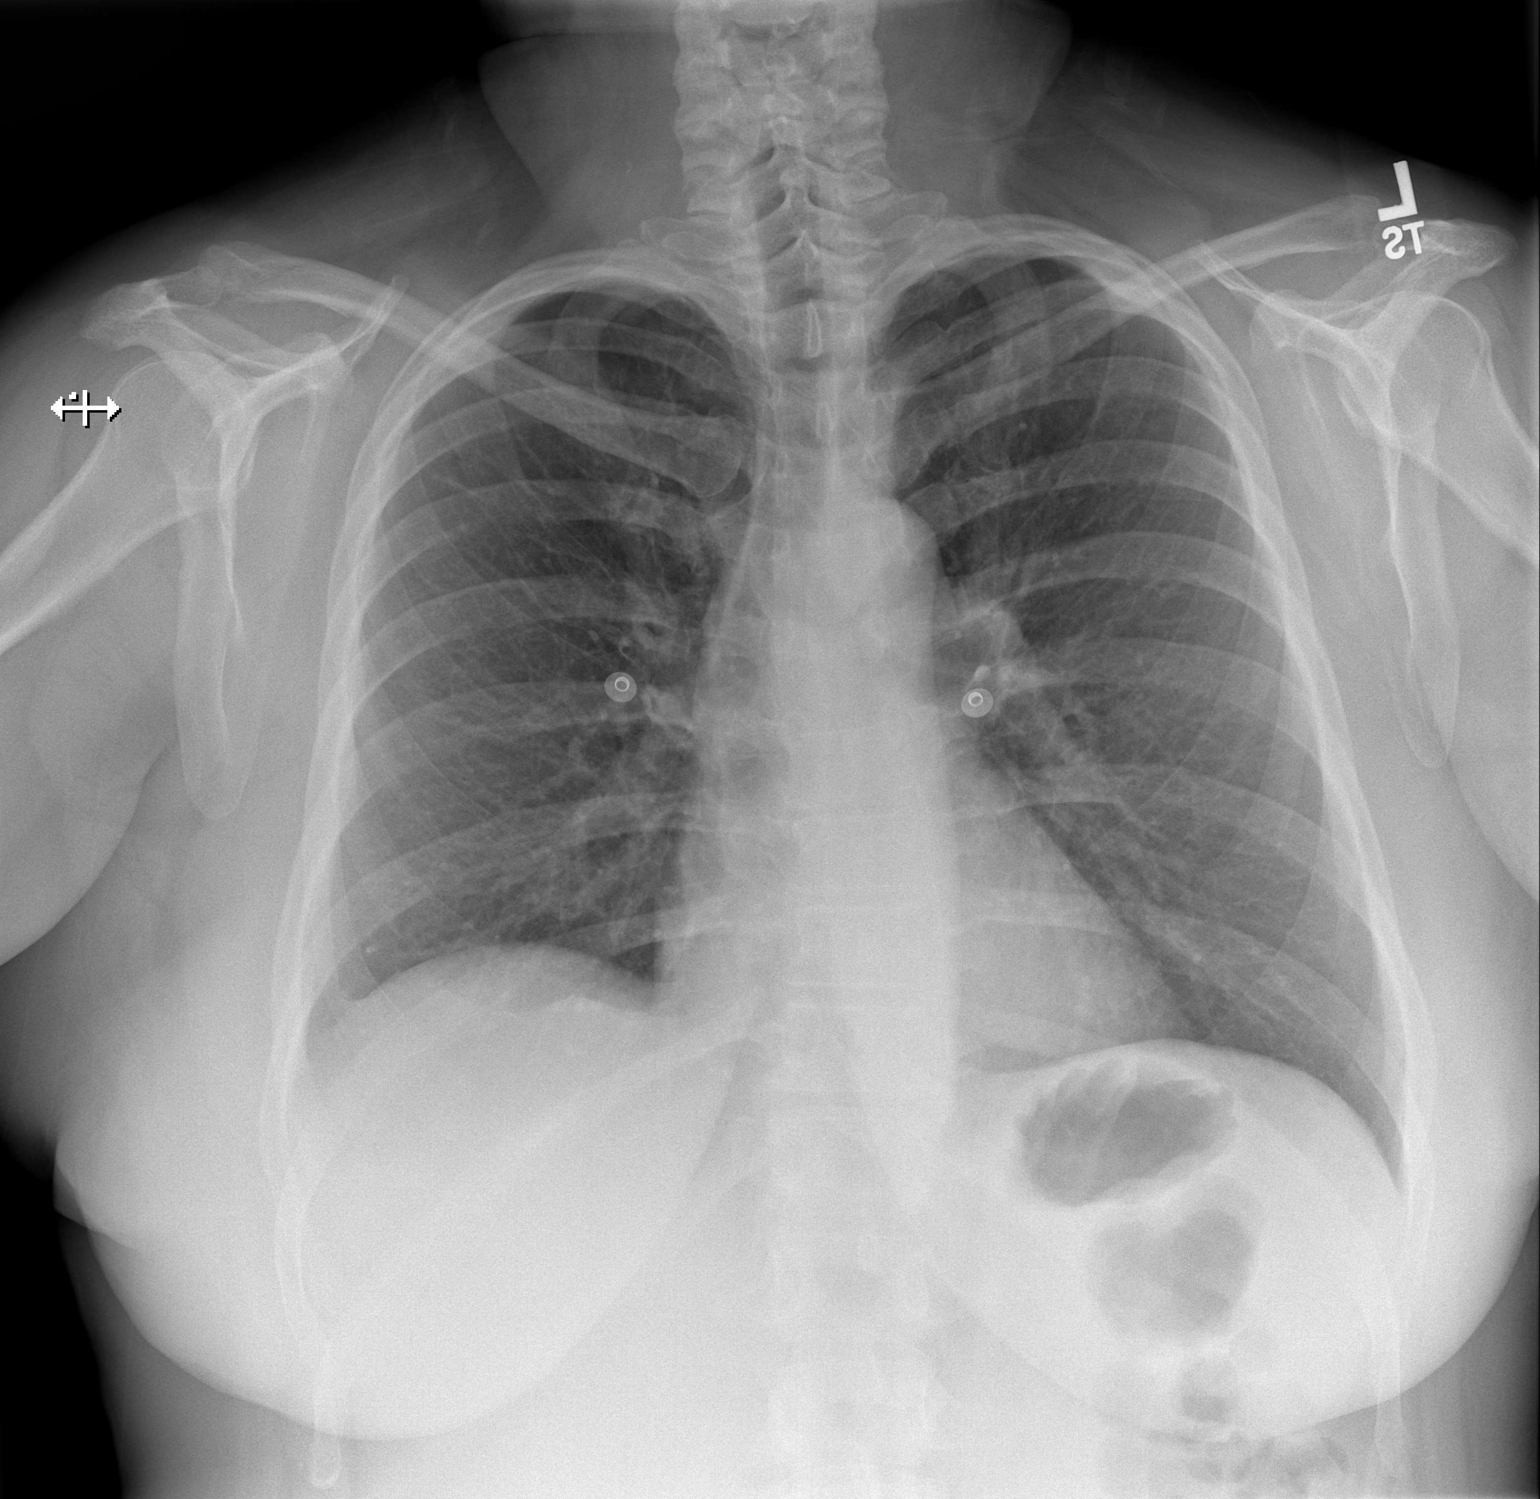
[im 2/2]
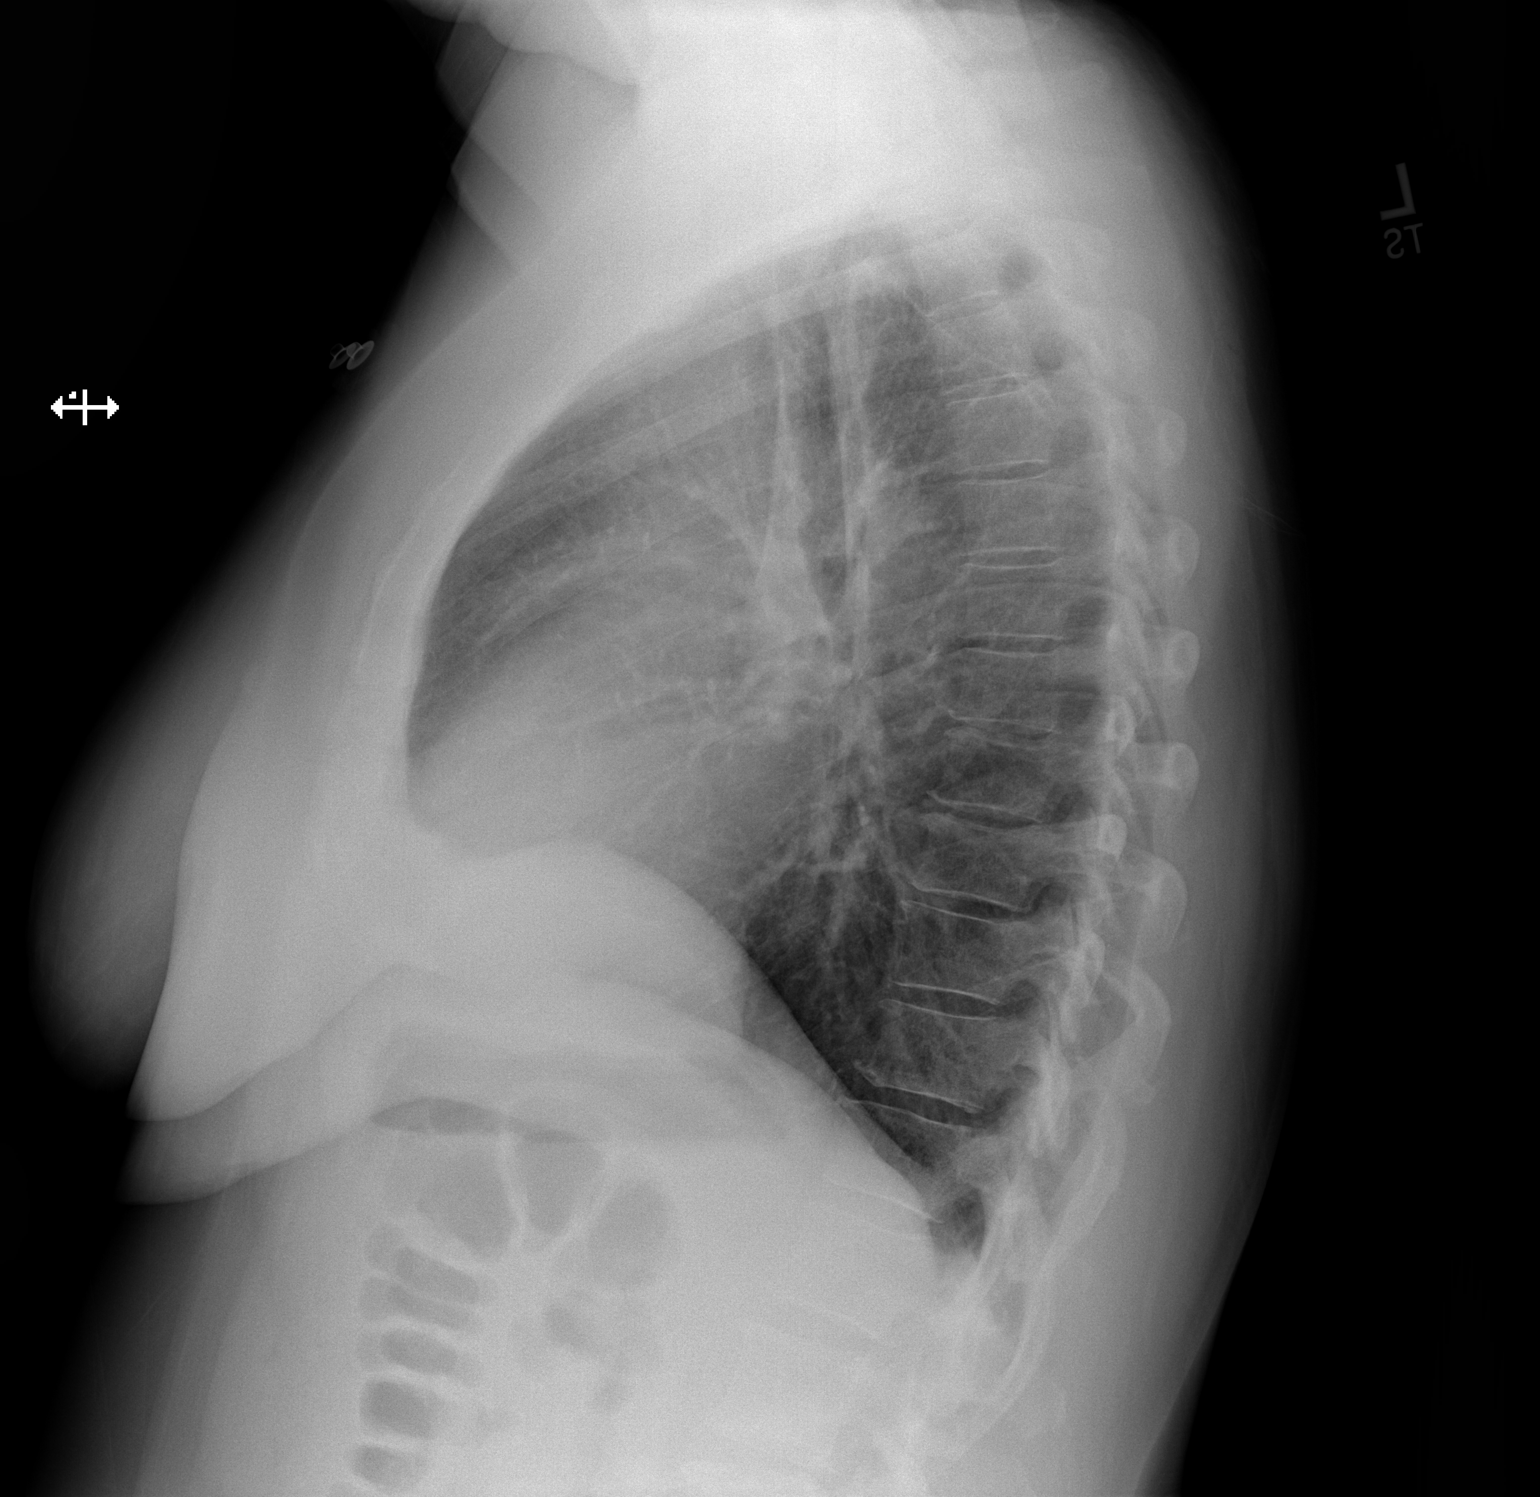

[2 of 2 positions shown; findings below may reference images not displayed]

FINDINGS: The cardiac and mediastinal silhouettes are stable in size and
contour, and remain within normal limits.

The lungs are normally inflated. Minimal diffuse peribronchial
thickening, similar to previous. No airspace consolidation, pleural
effusion, or pulmonary edema is identified. There is no
pneumothorax.

No acute osseous abnormality identified.
IMPRESSION: 1. No active cardiopulmonary disease.
2. Mild diffuse bronchitic changes, similar to prior, and likely
chronic.

## 2017-02-05 ENCOUNTER — Emergency Department
Admission: EM | Admit: 2017-02-05 | Discharge: 2017-02-05 | Disposition: A | Discharge status: Home / Self Care | Payer: Medicaid Other | Attending: Student in an Organized Health Care Education/Training Program | Admitting: Student in an Organized Health Care Education/Training Program

## 2017-02-05 ENCOUNTER — Encounter: Payer: Self-pay | Admitting: Emergency Medicine

## 2017-02-05 DIAGNOSIS — I10 Essential (primary) hypertension: Secondary | ICD-10-CM | POA: Insufficient documentation

## 2017-02-05 DIAGNOSIS — F17210 Nicotine dependence, cigarettes, uncomplicated: Secondary | ICD-10-CM | POA: Insufficient documentation

## 2017-02-05 DIAGNOSIS — G8929 Other chronic pain: Secondary | ICD-10-CM

## 2017-02-05 DIAGNOSIS — M545 Low back pain, unspecified: Secondary | ICD-10-CM

## 2017-02-05 DIAGNOSIS — M62830 Muscle spasm of back: Secondary | ICD-10-CM | POA: Insufficient documentation

## 2017-02-05 DIAGNOSIS — M549 Dorsalgia, unspecified: Secondary | ICD-10-CM | POA: Diagnosis present

## 2017-02-05 DIAGNOSIS — I259 Chronic ischemic heart disease, unspecified: Secondary | ICD-10-CM | POA: Insufficient documentation

## 2017-02-05 DIAGNOSIS — T148XXA Other injury of unspecified body region, initial encounter: Secondary | ICD-10-CM

## 2017-02-05 DIAGNOSIS — Z79899 Other long term (current) drug therapy: Secondary | ICD-10-CM | POA: Insufficient documentation

## 2017-02-05 DIAGNOSIS — J449 Chronic obstructive pulmonary disease, unspecified: Secondary | ICD-10-CM | POA: Diagnosis not present

## 2017-02-05 MED ORDER — TIZANIDINE HCL 4 MG PO TABS: 4.0000 mg | ORAL_TABLET | Freq: Three times a day (TID) | ORAL | 0 refills | Status: DC

## 2017-02-05 MED ORDER — HYDROCODONE-ACETAMINOPHEN 5-325 MG PO TABS: 1.0000 | ORAL_TABLET | Freq: Four times a day (QID) | ORAL | 0 refills | Status: DC | PRN

## 2017-02-05 MED ORDER — PREDNISONE 10 MG PO TABS
ORAL_TABLET | ORAL | 0 refills | Status: DC
Start: 2017-02-05 — End: 2017-02-21

## 2017-02-05 NOTE — ED Notes (Signed)
See triage note  States she developed back pain several days   Hx of spinal stenosis   States pain radiates from neck down to lower back  Hx of 1 recent fall   But has done a lot lifting   Ambulates slowly d/t pain

## 2017-02-05 NOTE — Discharge Instructions (Signed)
Continue taking gabapentin as prescribed. Begin taking prednisone as directed for the next 6 days. Zanaflex 3 times a day for muscle spasms and Norco as needed for moderate to severe pain for the next 2 days. You may use ice or heat to your back. You need to call DSS to get another doctor or call the above listed clinics to see if there taking new Medicaid patients.

## 2017-02-05 NOTE — ED Triage Notes (Addendum)
Patient ambulatory to triage with steady gait, without difficulty or distress noted; pt reports right sided back pain since Saturday; st has been caring for her ill dog and boyfriend recently and feels as if she has strained it; st hx of same with dx spinal stenosis and with movement of neck feels pain in neck as well

## 2017-02-05 NOTE — ED Provider Notes (Signed)
[facility] Emergency Department Provider Note  ____________________________________________   First MD Initiated Contact with Patient 02/05/17 0715     (approximate)  I have reviewed the triage vital signs and the nursing notes.   HISTORY  Chief Complaint Back Pain   HPI Sara Munoz is a 51 y.o. female is here complaining of right-sided back pain 4 days. Patient states that she has been caring for her usual dog and boyfriend recently and believes that she has strained her muscles. She also has a history of chronic back pain and has been diagnosed with spinal stenosis. She states that movement increases her pain. She states in the past she has taken both Vicodin and oxycodone but can no longer get this medication as she was discharged from [facility].  Currently she rates her pain as a 10 over 10. She denies any paresthesias to her lower extremities, incontinence of bowel or bladder, or saddle anesthesia is.   Past Medical History:  Diagnosis Date  . Anxiety   . Asthma   . Atypical chest pain 08/02/2015  . CAD (coronary artery disease)   . Chronic back pain   . COPD (chronic obstructive pulmonary disease) ([place])   . Coronary artery disease    a. Mild-non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/2014  . GERD (gastroesophageal reflux disease)   . Hypercholesteremia   . Hypertension   . Hypokalemia   . Hypomagnesemia 01/04/2014  . Liver disease   . MI (myocardial infarction) ([place])   . Osteoarthritis     Patient Active Problem List   Diagnosis Date Noted  . Neurogenic pain 07/31/2016  . Chronic pain syndrome 06/05/2016  . Carotid artery stenosis 02/27/2016  . Chronic constipation 02/27/2016  . Chronic nausea 02/27/2016  . Coronary artery disease 02/27/2016  . Hypercholesterolemia 02/27/2016  . Osteoarthritis 02/27/2016  . PTSD (post-traumatic stress disorder) 02/27/2016  . TIA (transient ischemic attack) 02/27/2016  . Long term current use of  opiate analgesic 02/27/2016  . Long term prescription opiate use 02/27/2016  . Opiate use 02/27/2016  . Encounter for therapeutic drug level monitoring 02/27/2016  . Encounter for pain management consult 02/27/2016  . Chronic hip pain (Location of Tertiary source of pain) (Bilateral) (L>R) 02/27/2016  . Chronic knee pain (Bilateral) (L>R) 02/27/2016  . Chronic shoulder pain (Bilateral) (L>R) 02/27/2016  . Chronic sacroiliac joint pain (Bilateral) (L>R) 02/27/2016  . Chronic low back pain (Location of Primary Source of Pain) (Bilateral) (L>R) 02/27/2016  . Chronic lower extremity pain (Location of Secondary source of pain) (Bilateral) (L>R) 02/27/2016  . Osteoarthritis of hip (Bilateral) (L>R) 02/27/2016  . Chronic neck pain (posterior midline) (Bilateral) (L>R) 02/27/2016  . Cervicogenic headache (Bilateral) (L>R) 02/27/2016  . Occipital headache (Bilateral) (L>R) 02/27/2016  . Chronic upper extremity pain (Bilateral) (L>R) 02/27/2016  . Chronic Cervical radicular pain (Bilateral) (L>R) 02/27/2016  . Chronic lumbar radicular pain (Right) (L5 dermatome) 02/27/2016  . Lumbar facet syndrome (Bilateral) (L>R) 02/27/2016  . Long term prescription benzodiazepine use 02/27/2016  . Hypertension   . COPD (chronic obstructive pulmonary disease) ([place]) 10/09/2015  . GERD (gastroesophageal reflux disease) 10/09/2015  . Non-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/2014 08/02/2015  . Hyperlipidemia 08/02/2015  . Costochondritis 08/02/2015  . 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 GAD (generalized anxiety disorder) 09/03/2014  . Narcotic dependence ([place]) 09/03/2014  . PVD (peripheral vascular disease) ([place]) 09/03/2014  . Smoker 09/03/2014  . Cigarette nicotine dependence with nicotine-induced disorder 07/08/2014  . Anemia of chronic disease  03/19/2014  . Narcotic abuse, continuous 03/19/2014  . Polysubstance abuse 03/19/2014  . Hypomagnesemia 01/04/2014  . Chronic cough 09/04/2011  . Sleep apnea, obstructive  09/04/2011    Past Surgical History:  Procedure Laterality Date  . CARDIAC CATHETERIZATION Left 02/22/2016   Procedure: Left Heart Cath and Coronary Angiography;  Surgeon: Yolonda Kida, MD;  Location: [place] [facility];  Service: Cardiovascular;  Laterality: Left;  . COLONOSCOPY WITH PROPOFOL N/A 09/19/2016   Procedure: COLONOSCOPY WITH PROPOFOL;  Surgeon: [name], MD;  Location: [facility] ENDOSCOPY;  Service: Endoscopy;  Laterality: N/A;  . hemorroids    . ORIF FEMUR FRACTURE    . ORIF TIBIA & FIBULA FRACTURES    . OVARY SURGERY      Prior to Admission medications   Medication Sig Start Date End Date Taking? Authorizing Provider  albuterol (PROVENTIL HFA;VENTOLIN HFA) 108 (90 Base) MCG/ACT inhaler Inhale 2 puffs into the lungs every 4 (four) hours as needed for wheezing or shortness of breath. 08/03/15   Demetrios Loll, MD  albuterol (PROVENTIL HFA;VENTOLIN HFA) 108 [phone] Base) MCG/ACT inhaler Inhale 2 puffs into the lungs every 6 (six) hours as needed for wheezing or shortness of breath. 10/10/16   Gregor Hams, MD  aspirin EC 81 MG tablet Take 81 mg by mouth daily.    [provider]  atorvastatin (LIPITOR) 20 MG tablet Take 20 mg by mouth at bedtime.    [provider]  budesonide-formoterol (SYMBICORT) 160-4.5 MCG/ACT inhaler Inhale 2 puffs into the lungs 2 (two) times daily. 05/31/15   Gregor Hams, MD  busPIRone (BUSPAR) 7.5 MG tablet Take 7.5 mg by mouth 3 (three) times daily.     [provider]  busPIRone (BUSPAR) 7.5 MG tablet Take by mouth.    [provider]  dexlansoprazole (DEXILANT) 60 MG capsule Take 60 mg by mouth daily.    [provider]  docusate sodium (COLACE) 100 MG capsule Take 200 mg by mouth 2 (two) times daily as needed for mild constipation.    [provider]  gabapentin (NEURONTIN) 300 MG capsule Take 2-3 capsules (600-900 mg total) by mouth 3 (three) times daily. 07/31/16 10/29/16  Milinda Pointer, MD    HYDROcodone-acetaminophen (NORCO/VICODIN) 5-325 MG tablet Take 1 tablet by mouth every 6 (six) hours as needed for moderate pain. 02/05/17   Johnn Hai, [name]  lidocaine (LIDODERM) 5 % Place 1 patch onto the skin every 12 (twelve) hours. Remove & Discard patch within 12 hours or as directed by MD 09/24/16 09/24/17  Loney Hering, MD  linaclotide (LINZESS) 290 MCG CAPS capsule Take 290 mcg by mouth daily before breakfast.    [provider]  lisinopril (PRINIVIL,ZESTRIL) 20 MG tablet Take 40 mg by mouth daily.     [provider]  Magnesium Oxide 500 MG CAPS Take 1 capsule (500 mg total) by mouth 2 (two) times daily at 8 am and 10 pm. 04/24/16   Milinda Pointer, MD  meloxicam (MOBIC) 15 MG tablet Take 1 tablet (15 mg total) by mouth daily. 06/05/16 09/03/16  Milinda Pointer, MD  metoCLOPramide (REGLAN) 10 MG tablet Take 1 tablet (10 mg total) by mouth every 6 (six) hours as needed. 01/01/17   Carrie Mew, MD  metoprolol tartrate (LOPRESSOR) 25 MG tablet Take 1 tablet (25 mg total) by mouth 2 (two) times daily. 08/03/15   Demetrios Loll, MD  nitroGLYCERIN (NITROSTAT) 0.4 MG SL tablet Place 1 tablet (0.4 mg total) under the tongue  every 5 (five) minutes x 3 doses as needed for chest pain. 08/03/15   Demetrios Loll, MD  potassium chloride (KLOR-CON) 20 MEQ packet Take 20 mEq by mouth 2 (two) times daily.     [provider]  predniSONE (DELTASONE) 10 MG tablet Take 6 tablets  today, on day 2 take 5 tablets, day 3 take 4 tablets, day 4 take 3 tablets, day 5 take  2 tablets and 1 tablet the last day 02/05/17   Johnn Hai, [name]  RANEXA 500 MG 12 hr tablet Take 1 tablet (500 mg total) by mouth 2 (two) times daily. 08/03/15   Demetrios Loll, MD  tiZANidine (ZANAFLEX) 4 MG tablet Take 1 tablet (4 mg total) by mouth 3 (three) times daily. 02/05/17   Johnn Hai, [name]    Allergies Levofloxacin; Flexeril [cyclobenzaprine]; Keflex [cephalexin]; Ketorolac; Toradol [ketorolac  tromethamine]; Tramadol; and Zoloft [sertraline hcl]  Family History  Problem Relation Age of Onset  . Heart disease Mother   . Lung cancer Mother   . Ovarian cancer Mother     Social History Social History  Substance Use Topics  . Smoking status: Current Every Day Smoker    Packs/day: 0.50    Types: Cigarettes  . Smokeless tobacco: Never Used  . Alcohol use 0.0 oz/week     Comment: drinks occasionally    Review of Systems Constitutional: No fever/chills Cardiovascular: Denies chest pain. Respiratory: Denies shortness of breath. Gastrointestinal:   No nausea, no vomiting.  Genitourinary: Negative for dysuria. Musculoskeletal: Positive for back pain. Skin: Negative for rash. Neurological: Negative for headaches, focal weakness or numbness. ____________________________________________   PHYSICAL EXAM:  VITAL SIGNS: ED Triage Vitals [02/05/17 0600]  Enc Vitals Group     BP (!) 148/94     Pulse Rate 81     Resp 18     Temp 97.8 F (36.6 C)     Temp Source Oral     SpO2 98 %     Weight 190 lb (86.2 kg)     Height 5\' 4"  (1.626 m)     Head Circumference      Peak Flow      Pain Score 10     Pain Loc      Pain Edu?      Excl. in [place]?    Constitutional: Alert and oriented. Well appearing and in no acute distress. Eyes: Conjunctivae are normal. PERRL. EOMI. Head: Atraumatic. Neck: No stridor.   Cardiovascular: Normal rate, regular rhythm. Grossly normal heart sounds.  Good peripheral circulation. Respiratory: Normal respiratory effort.  No retractions. Lungs CTAB. Gastrointestinal: Soft and nontender. No distention.  No CVA tenderness. Musculoskeletal: On examination back there is no gross deformity noted however there is moderate tenderness on palpation generalized lumbar area and soft tissue to the right. Range of motion is slow and guarded secondary to discomfort. Straight leg raises are negative. Normal gait was noted and was unassisted. Neurologic:  Normal  speech and language. No gross focal neurologic deficits are appreciated. Reflexes are 2+ bilaterally. No gait instability. Skin:  Skin is warm, dry and intact. No rash noted. Psychiatric: Mood and affect are normal. Speech and behavior are normal.  ____________________________________________   LABS (all labs ordered are listed, but only abnormal results are displayed)  Labs Reviewed - No data to display   PROCEDURES  Procedure(s) performed: None  Procedures  Critical Care performed: No  ____________________________________________   INITIAL IMPRESSION / ASSESSMENT AND PLAN / ED COURSE  Pertinent labs & imaging results that were available during my care of the patient were reviewed by me and considered in my medical decision making (see chart for details).  Patient is to continue taking gabapentin as prescribed. She was given a prescription for prednisone 6 day taper beginning with 60 mg. She was given a refill of her Zanaflex for muscle spasms as needed. She was given 12 Norco as needed for moderate to severe pain for the next 2 days. She may use ice or heat to her back as needed for comfort. She is encouraged to call DSS in making arrangements to see another doctor as her PCP will not see her anymore and has discharged her from the practice.     ____________________________________________   FINAL CLINICAL IMPRESSION(S) / ED DIAGNOSES  Final diagnoses:  Acute exacerbation of chronic low back pain  Muscle strain      NEW MEDICATIONS STARTED DURING THIS VISIT:  Discharge Medication List as of 02/05/2017  7:57 AM    START taking these medications   Details  predniSONE (DELTASONE) 10 MG tablet Take 6 tablets  today, on day 2 take 5 tablets, day 3 take 4 tablets, day 4 take 3 tablets, day 5 take  2 tablets and 1 tablet the last day, Print         Note:  This document was prepared using Dragon voice recognition software and may include unintentional dictation  errors.    Johnn Hai, [name] 02/05/17 1313    Merlyn Lot, MD 02/05/17 [phone]

## 2017-02-11 ENCOUNTER — Emergency Department
Admission: EM | Admit: 2017-02-11 | Discharge: 2017-02-11 | Disposition: A | Discharge status: Home / Self Care | Payer: Medicaid Other | Attending: Student in an Organized Health Care Education/Training Program | Admitting: Student in an Organized Health Care Education/Training Program

## 2017-02-11 ENCOUNTER — Encounter: Payer: Self-pay | Admitting: *Deleted

## 2017-02-11 DIAGNOSIS — I10 Essential (primary) hypertension: Secondary | ICD-10-CM | POA: Insufficient documentation

## 2017-02-11 DIAGNOSIS — I2510 Atherosclerotic heart disease of native coronary artery without angina pectoris: Secondary | ICD-10-CM | POA: Insufficient documentation

## 2017-02-11 DIAGNOSIS — G8929 Other chronic pain: Secondary | ICD-10-CM | POA: Insufficient documentation

## 2017-02-11 DIAGNOSIS — Z7982 Long term (current) use of aspirin: Secondary | ICD-10-CM | POA: Insufficient documentation

## 2017-02-11 DIAGNOSIS — M545 Low back pain, unspecified: Secondary | ICD-10-CM

## 2017-02-11 DIAGNOSIS — J45909 Unspecified asthma, uncomplicated: Secondary | ICD-10-CM | POA: Insufficient documentation

## 2017-02-11 DIAGNOSIS — Z79899 Other long term (current) drug therapy: Secondary | ICD-10-CM | POA: Diagnosis not present

## 2017-02-11 DIAGNOSIS — J449 Chronic obstructive pulmonary disease, unspecified: Secondary | ICD-10-CM | POA: Diagnosis not present

## 2017-02-11 DIAGNOSIS — F17210 Nicotine dependence, cigarettes, uncomplicated: Secondary | ICD-10-CM | POA: Insufficient documentation

## 2017-02-11 MED ORDER — MELOXICAM 7.5 MG PO TABS: 7.5000 mg | ORAL_TABLET | Freq: Every day | ORAL | 1 refills | Status: DC

## 2017-02-11 NOTE — ED Provider Notes (Signed)
[facility] Emergency Department Provider Note  ____________________________________________  Time seen: Approximately 7:07 PM  I have reviewed the triage vital signs and the nursing notes.   HISTORY  Chief Complaint Back Pain    HPI Sara Munoz is a 51 y.o. female presenting to the emergency department with 10 out of 10 right-sided low back pain and neck pain. Patient was seen for similar complaints on 02/05/2017 and was discharged with Norco. Patient states that she has had increased physical demands with her boyfriend and a large dog that lives with her at home. Patient states that she was previously under the care of pain management but moved and is currently trying to seek care with a local pain management facility. Patient denies radiculopathy, weakness or changes in sensation of the lower extremities. Patient states that she "doesn't have time for x-rays". She states that she is allergic to Flexeril. Patient states "can't you just give me a shot?" She denies dysuria, hematuria and increased urinary frequency.   Past Medical History:  Diagnosis Date  . Anxiety   . Asthma   . Atypical chest pain 08/02/2015  . CAD (coronary artery disease)   . Chronic back pain   . COPD (chronic obstructive pulmonary disease) ([place])   . Coronary artery disease    a. Mild-non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/2014  . GERD (gastroesophageal reflux disease)   . Hypercholesteremia   . Hypertension   . Hypokalemia   . Hypomagnesemia 01/04/2014  . Liver disease   . MI (myocardial infarction) ([place])   . Osteoarthritis     Patient Active Problem List   Diagnosis Date Noted  . Neurogenic pain 07/31/2016  . Chronic pain syndrome 06/05/2016  . Carotid artery stenosis 02/27/2016  . Chronic constipation 02/27/2016  . Chronic nausea 02/27/2016  . Coronary artery disease 02/27/2016  . Hypercholesterolemia 02/27/2016  . Osteoarthritis 02/27/2016  . PTSD (post-traumatic stress  disorder) 02/27/2016  . TIA (transient ischemic attack) 02/27/2016  . Long term current use of opiate analgesic 02/27/2016  . Long term prescription opiate use 02/27/2016  . Opiate use 02/27/2016  . Encounter for therapeutic drug level monitoring 02/27/2016  . Encounter for pain management consult 02/27/2016  . Chronic hip pain (Location of Tertiary source of pain) (Bilateral) (L>R) 02/27/2016  . Chronic knee pain (Bilateral) (L>R) 02/27/2016  . Chronic shoulder pain (Bilateral) (L>R) 02/27/2016  . Chronic sacroiliac joint pain (Bilateral) (L>R) 02/27/2016  . Chronic low back pain (Location of Primary Source of Pain) (Bilateral) (L>R) 02/27/2016  . Chronic lower extremity pain (Location of Secondary source of pain) (Bilateral) (L>R) 02/27/2016  . Osteoarthritis of hip (Bilateral) (L>R) 02/27/2016  . Chronic neck pain (posterior midline) (Bilateral) (L>R) 02/27/2016  . Cervicogenic headache (Bilateral) (L>R) 02/27/2016  . Occipital headache (Bilateral) (L>R) 02/27/2016  . Chronic upper extremity pain (Bilateral) (L>R) 02/27/2016  . Chronic Cervical radicular pain (Bilateral) (L>R) 02/27/2016  . Chronic lumbar radicular pain (Right) (L5 dermatome) 02/27/2016  . Lumbar facet syndrome (Bilateral) (L>R) 02/27/2016  . Long term prescription benzodiazepine use 02/27/2016  . Hypertension   . COPD (chronic obstructive pulmonary disease) ([place]) 10/09/2015  . GERD (gastroesophageal reflux disease) 10/09/2015  . Non-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/2014 08/02/2015  . Hyperlipidemia 08/02/2015  . Costochondritis 08/02/2015  . 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 GAD (generalized anxiety disorder) 09/03/2014  . Narcotic dependence ([place]) 09/03/2014  . PVD (peripheral vascular disease) ([place]) 09/03/2014  . Smoker 09/03/2014  . Cigarette nicotine dependence with nicotine-induced disorder  07/08/2014  . Anemia of chronic disease 03/19/2014  . Narcotic abuse, continuous 03/19/2014  . Polysubstance abuse  03/19/2014  . Hypomagnesemia 01/04/2014  . Chronic cough 09/04/2011  . Sleep apnea, obstructive 09/04/2011    Past Surgical History:  Procedure Laterality Date  . CARDIAC CATHETERIZATION Left 02/22/2016   Procedure: Left Heart Cath and Coronary Angiography;  Surgeon: Yolonda Kida, MD;  Location: [place] [facility];  Service: Cardiovascular;  Laterality: Left;  . COLONOSCOPY WITH PROPOFOL N/A 09/19/2016   Procedure: COLONOSCOPY WITH PROPOFOL;  Surgeon: [name], MD;  Location: [facility] ENDOSCOPY;  Service: Endoscopy;  Laterality: N/A;  . hemorroids    . ORIF FEMUR FRACTURE    . ORIF TIBIA & FIBULA FRACTURES    . OVARY SURGERY      Prior to Admission medications   Medication Sig Start Date End Date Taking? Authorizing Provider  albuterol (PROVENTIL HFA;VENTOLIN HFA) 108 (90 Base) MCG/ACT inhaler Inhale 2 puffs into the lungs every 4 (four) hours as needed for wheezing or shortness of breath. 08/03/15   Demetrios Loll, MD  albuterol (PROVENTIL HFA;VENTOLIN HFA) 108 [phone] Base) MCG/ACT inhaler Inhale 2 puffs into the lungs every 6 (six) hours as needed for wheezing or shortness of breath. 10/10/16   Gregor Hams, MD  aspirin EC 81 MG tablet Take 81 mg by mouth daily.    [provider]  atorvastatin (LIPITOR) 20 MG tablet Take 20 mg by mouth at bedtime.    [provider]  budesonide-formoterol (SYMBICORT) 160-4.5 MCG/ACT inhaler Inhale 2 puffs into the lungs 2 (two) times daily. 05/31/15   Gregor Hams, MD  busPIRone (BUSPAR) 7.5 MG tablet Take 7.5 mg by mouth 3 (three) times daily.     [provider]  busPIRone (BUSPAR) 7.5 MG tablet Take by mouth.    [provider]  dexlansoprazole (DEXILANT) 60 MG capsule Take 60 mg by mouth daily.    [provider]  docusate sodium (COLACE) 100 MG capsule Take 200 mg by mouth 2 (two) times daily as needed for mild constipation.    [provider]  gabapentin (NEURONTIN) 300 MG capsule Take 2-3  capsules (600-900 mg total) by mouth 3 (three) times daily. 07/31/16 10/29/16  Milinda Pointer, MD  HYDROcodone-acetaminophen (NORCO/VICODIN) 5-325 MG tablet Take 1 tablet by mouth every 6 (six) hours as needed for moderate pain. 02/05/17   Johnn Hai, [name]  lidocaine (LIDODERM) 5 % Place 1 patch onto the skin every 12 (twelve) hours. Remove & Discard patch within 12 hours or as directed by MD 09/24/16 09/24/17  Loney Hering, MD  linaclotide (LINZESS) 290 MCG CAPS capsule Take 290 mcg by mouth daily before breakfast.    [provider]  lisinopril (PRINIVIL,ZESTRIL) 20 MG tablet Take 40 mg by mouth daily.     [provider]  Magnesium Oxide 500 MG CAPS Take 1 capsule (500 mg total) by mouth 2 (two) times daily at 8 am and 10 pm. 04/24/16   Milinda Pointer, MD  meloxicam (MOBIC) 7.5 MG tablet Take 1 tablet (7.5 mg total) by mouth daily. 02/11/17 02/18/17  Lannie Fields, [name]  metoCLOPramide (REGLAN) 10 MG tablet Take 1 tablet (10 mg total) by mouth every 6 (six) hours as needed. 01/01/17   Carrie Mew, MD  metoprolol tartrate (LOPRESSOR) 25 MG tablet Take 1 tablet (25 mg total) by mouth 2 (two) times daily. 08/03/15   Demetrios Loll, MD  nitroGLYCERIN (NITROSTAT) 0.4 MG SL tablet Place 1  tablet (0.4 mg total) under the tongue every 5 (five) minutes x 3 doses as needed for chest pain. 08/03/15   Demetrios Loll, MD  potassium chloride (KLOR-CON) 20 MEQ packet Take 20 mEq by mouth 2 (two) times daily.     [provider]  predniSONE (DELTASONE) 10 MG tablet Take 6 tablets  today, on day 2 take 5 tablets, day 3 take 4 tablets, day 4 take 3 tablets, day 5 take  2 tablets and 1 tablet the last day 02/05/17   Johnn Hai, [name]  RANEXA 500 MG 12 hr tablet Take 1 tablet (500 mg total) by mouth 2 (two) times daily. 08/03/15   Demetrios Loll, MD  tiZANidine (ZANAFLEX) 4 MG tablet Take 1 tablet (4 mg total) by mouth 3 (three) times daily. 02/05/17   Johnn Hai, [name]     Allergies Levofloxacin; Flexeril [cyclobenzaprine]; Keflex [cephalexin]; Ketorolac; Toradol [ketorolac tromethamine]; Tramadol; and Zoloft [sertraline hcl]  Family History  Problem Relation Age of Onset  . Heart disease Mother   . Lung cancer Mother   . Ovarian cancer Mother     Social History Social History  Substance Use Topics  . Smoking status: Current Every Day Smoker    Packs/day: 0.50    Types: Cigarettes  . Smokeless tobacco: Never Used  . Alcohol use No     Review of Systems  Constitutional: No fever/chills Eyes: No visual changes. No discharge Respiratory: no cough. No SOB. Gastrointestinal: No abdominal pain.  No nausea, no vomiting.  No diarrhea.  No constipation. Genitourinary: Negative for dysuria. No hematuria Musculoskeletal: patient has low back pain and neck pain.  Skin: Negative for rash, abrasions, lacerations, ecchymosis. Neurological: Negative for headaches, focal weakness or numbness.   ____________________________________________   PHYSICAL EXAM:  VITAL SIGNS: ED Triage Vitals  Enc Vitals Group     BP 02/11/17 1744 (!) 118/93     Pulse Rate 02/11/17 1744 91     Resp 02/11/17 1744 16     Temp 02/11/17 1744 98.8 F (37.1 C)     Temp Source 02/11/17 1744 Oral     SpO2 02/11/17 1744 98 %     Weight 02/11/17 1736 190 lb (86.2 kg)     Height 02/11/17 1736 5\' 4"  (1.626 m)     Head Circumference --      Peak Flow --      Pain Score 02/11/17 1744 10     Pain Loc --      Pain Edu? --      Excl. in [place]? --      Constitutional: Alert and oriented. Well appearing and in no acute distress. Eyes: Conjunctivae are normal. PERRL. EOMI. Head: Atraumatic. Cardiovascular: Normal rate, regular rhythm. Normal S1 and S2.  Good peripheral circulation. Respiratory: Normal respiratory effort without tachypnea or retractions. Lungs CTAB. Good air entry to the bases with no decreased or absent breath sounds. Musculoskeletal: Patient has 5/5 strength in  the upper and lower extremities bilaterally. Full range of motion at the shoulder, elbow and wrist bilaterally. Full range of motion at the hip, knee and ankle bilaterally. No changes in gait. Negative straight leg raise test bilaterally. Neurologic:  Normal speech and language. No gross focal neurologic deficits are appreciated.  Skin:  Skin is warm, dry and intact. No rash noted. Psychiatric: Mood and affect are normal. Speech and behavior are normal. Patient exhibits appropriate insight and judgement.   ____________________________________________   LABS (all labs ordered are listed, but  only abnormal results are displayed)  Labs Reviewed - No data to display ____________________________________________  EKG   ____________________________________________  RADIOLOGY  No results found.  ____________________________________________    PROCEDURES  Procedure(s) performed:    Procedures    Medications - No data to display   ____________________________________________   INITIAL IMPRESSION / ASSESSMENT AND PLAN / ED COURSE  Pertinent labs & imaging results that were available during my care of the patient were reviewed by me and considered in my medical decision making (see chart for details).  Review of the [place] CSRS was performed in accordance of the [place] prior to dispensing any controlled drugs.     Assessment and plan Low back pain Patient presents to the emergency department with chronic low back pain. Patient declined x-ray examination th emergency department stating, "I need to pick up my husband". Patient did not have a driver here today. Patient was discharged with meloxicam and referred to neurosurgery, Dr. Cari Caraway. All patient questions were answered.   ____________________________________________  FINAL CLINICAL IMPRESSION(S) / ED DIAGNOSES  Final diagnoses:  Chronic right-sided low back pain without sciatica      NEW MEDICATIONS STARTED  DURING THIS VISIT:  New Prescriptions   MELOXICAM (MOBIC) 7.5 MG TABLET    Take 1 tablet (7.5 mg total) by mouth daily.        This chart was dictated using voice recognition software/Dragon. Despite best efforts to proofread, errors can occur which can change the meaning. Any change was purely unintentional.    Lannie Fields, [name] 02/11/17 1918    Merlyn Lot, MD 02/11/17 2201

## 2017-02-11 NOTE — ED Triage Notes (Signed)
Pt to ED reporting 10/10 upper back and neck pain. Pt reports having spinal stenosis and 2 weeks ago she lifted her dog and has had pain in back ever since. Pt able to ambulate and denies numbness or tingling. Pt reports she is trying to get to a pain clinic but is not currently in any pain clinic.

## 2017-02-21 ENCOUNTER — Emergency Department: Payer: Medicaid Other

## 2017-02-21 ENCOUNTER — Emergency Department
Admission: EM | Admit: 2017-02-21 | Discharge: 2017-02-21 | Disposition: A | Discharge status: Home / Self Care | Payer: Medicaid Other | Attending: Emergency Medicine | Admitting: Emergency Medicine

## 2017-02-21 ENCOUNTER — Encounter: Payer: Self-pay | Admitting: Emergency Medicine

## 2017-02-21 DIAGNOSIS — I259 Chronic ischemic heart disease, unspecified: Secondary | ICD-10-CM | POA: Diagnosis not present

## 2017-02-21 DIAGNOSIS — J45909 Unspecified asthma, uncomplicated: Secondary | ICD-10-CM | POA: Diagnosis not present

## 2017-02-21 DIAGNOSIS — M542 Cervicalgia: Secondary | ICD-10-CM | POA: Diagnosis present

## 2017-02-21 DIAGNOSIS — M5413 Radiculopathy, cervicothoracic region: Secondary | ICD-10-CM | POA: Diagnosis not present

## 2017-02-21 DIAGNOSIS — I10 Essential (primary) hypertension: Secondary | ICD-10-CM | POA: Diagnosis not present

## 2017-02-21 DIAGNOSIS — F17210 Nicotine dependence, cigarettes, uncomplicated: Secondary | ICD-10-CM | POA: Insufficient documentation

## 2017-02-21 DIAGNOSIS — Z7982 Long term (current) use of aspirin: Secondary | ICD-10-CM | POA: Diagnosis not present

## 2017-02-21 DIAGNOSIS — J449 Chronic obstructive pulmonary disease, unspecified: Secondary | ICD-10-CM | POA: Diagnosis not present

## 2017-02-21 DIAGNOSIS — Z79899 Other long term (current) drug therapy: Secondary | ICD-10-CM | POA: Diagnosis not present

## 2017-02-21 MED ORDER — PREDNISONE 10 MG (21) PO TBPK: ORAL_TABLET | ORAL | 0 refills | Status: DC

## 2017-02-21 NOTE — ED Notes (Signed)
Pt. States her pain is in the neck and when moving head from side to side numbness and tingles radiate all the way down to feet and down right arm and hand.

## 2017-02-21 NOTE — ED Provider Notes (Signed)
[facility] Emergency Department Provider Note ____________________________________________  Time seen: Approximately 9:36 AM  I have reviewed the triage vital signs and the nursing notes.   HISTORY  Chief Complaint Back Pain and Neck Pain   HPI Sara Munoz is a 51 y.o. female who presents to the emergency department for evaluation and treatment of neck pain that she states sheets down her entire body. Pain started approximately 2 weeks ago and has been unrelieved by her daily medications that include meloxicam, a muscle relaxer, and gabapentin. She denies injury. She denies doing a job that requires repetitive motion.  Past Medical History:  Diagnosis Date  . Anxiety   . Asthma   . Atypical chest pain 08/02/2015  . CAD (coronary artery disease)   . Chronic back pain   . COPD (chronic obstructive pulmonary disease) ([place])   . Coronary artery disease    a. Mild-non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/2014  . GERD (gastroesophageal reflux disease)   . Hypercholesteremia   . Hypertension   . Hypokalemia   . Hypomagnesemia 01/04/2014  . Liver disease   . MI (myocardial infarction) ([place])   . Osteoarthritis     Patient Active Problem List   Diagnosis Date Noted  . Neurogenic pain 07/31/2016  . Chronic pain syndrome 06/05/2016  . Carotid artery stenosis 02/27/2016  . Chronic constipation 02/27/2016  . Chronic nausea 02/27/2016  . Coronary artery disease 02/27/2016  . Hypercholesterolemia 02/27/2016  . Osteoarthritis 02/27/2016  . PTSD (post-traumatic stress disorder) 02/27/2016  . TIA (transient ischemic attack) 02/27/2016  . Long term current use of opiate analgesic 02/27/2016  . Long term prescription opiate use 02/27/2016  . Opiate use 02/27/2016  . Encounter for therapeutic drug level monitoring 02/27/2016  . Encounter for pain management consult 02/27/2016  . Chronic hip pain (Location of Tertiary source of pain) (Bilateral) (L>R) 02/27/2016  .  Chronic knee pain (Bilateral) (L>R) 02/27/2016  . Chronic shoulder pain (Bilateral) (L>R) 02/27/2016  . Chronic sacroiliac joint pain (Bilateral) (L>R) 02/27/2016  . Chronic low back pain (Location of Primary Source of Pain) (Bilateral) (L>R) 02/27/2016  . Chronic lower extremity pain (Location of Secondary source of pain) (Bilateral) (L>R) 02/27/2016  . Osteoarthritis of hip (Bilateral) (L>R) 02/27/2016  . Chronic neck pain (posterior midline) (Bilateral) (L>R) 02/27/2016  . Cervicogenic headache (Bilateral) (L>R) 02/27/2016  . Occipital headache (Bilateral) (L>R) 02/27/2016  . Chronic upper extremity pain (Bilateral) (L>R) 02/27/2016  . Chronic Cervical radicular pain (Bilateral) (L>R) 02/27/2016  . Chronic lumbar radicular pain (Right) (L5 dermatome) 02/27/2016  . Lumbar facet syndrome (Bilateral) (L>R) 02/27/2016  . Long term prescription benzodiazepine use 02/27/2016  . Hypertension   . COPD (chronic obstructive pulmonary disease) ([place]) 10/09/2015  . GERD (gastroesophageal reflux disease) 10/09/2015  . Non-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/2014 08/02/2015  . Hyperlipidemia 08/02/2015  . Costochondritis 08/02/2015  . 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 GAD (generalized anxiety disorder) 09/03/2014  . Narcotic dependence ([place]) 09/03/2014  . PVD (peripheral vascular disease) ([place]) 09/03/2014  . Smoker 09/03/2014  . Cigarette nicotine dependence with nicotine-induced disorder 07/08/2014  . Anemia of chronic disease 03/19/2014  . Narcotic abuse, continuous 03/19/2014  . Polysubstance abuse 03/19/2014  . Hypomagnesemia 01/04/2014  . Chronic cough 09/04/2011  . Sleep apnea, obstructive 09/04/2011    Past Surgical History:  Procedure Laterality Date  . CARDIAC CATHETERIZATION Left 02/22/2016   Procedure: Left Heart Cath and Coronary Angiography;  Surgeon: Yolonda Kida, MD;  Location: [place] INVASIVE CV  LAB;  Service: Cardiovascular;  Laterality: Left;  . COLONOSCOPY WITH PROPOFOL N/A  09/19/2016   Procedure: COLONOSCOPY WITH PROPOFOL;  Surgeon: [name], MD;  Location: [facility] ENDOSCOPY;  Service: Endoscopy;  Laterality: N/A;  . hemorroids    . ORIF FEMUR FRACTURE    . ORIF TIBIA & FIBULA FRACTURES    . OVARY SURGERY      Prior to Admission medications   Medication Sig Start Date End Date Taking? Authorizing Provider  albuterol (PROVENTIL HFA;VENTOLIN HFA) 108 (90 Base) MCG/ACT inhaler Inhale 2 puffs into the lungs every 4 (four) hours as needed for wheezing or shortness of breath. 08/03/15   Demetrios Loll, MD  albuterol (PROVENTIL HFA;VENTOLIN HFA) 108 [id number] Base) MCG/ACT inhaler Inhale 2 puffs into the lungs every 6 (six) hours as needed for wheezing or shortness of breath. 10/10/16   Gregor Hams, MD  aspirin EC 81 MG tablet Take 81 mg by mouth daily.    [provider]  atorvastatin (LIPITOR) 20 MG tablet Take 20 mg by mouth at bedtime.    [provider]  budesonide-formoterol (SYMBICORT) 160-4.5 MCG/ACT inhaler Inhale 2 puffs into the lungs 2 (two) times daily. 05/31/15   Gregor Hams, MD  busPIRone (BUSPAR) 7.5 MG tablet Take 7.5 mg by mouth 3 (three) times daily.     [provider]  busPIRone (BUSPAR) 7.5 MG tablet Take by mouth.    [provider]  dexlansoprazole (DEXILANT) 60 MG capsule Take 60 mg by mouth daily.    [provider]  docusate sodium (COLACE) 100 MG capsule Take 200 mg by mouth 2 (two) times daily as needed for mild constipation.    [provider]  gabapentin (NEURONTIN) 300 MG capsule Take 2-3 capsules (600-900 mg total) by mouth 3 (three) times daily. 07/31/16 10/29/16  Milinda Pointer, MD  HYDROcodone-acetaminophen (NORCO/VICODIN) 5-325 MG tablet Take 1 tablet by mouth every 6 (six) hours as needed for moderate pain. 02/05/17   Johnn Hai, [name]  lidocaine (LIDODERM) 5 % Place 1 patch onto the skin every 12 (twelve) hours. Remove & Discard patch within 12 hours or as directed by MD 09/24/16  09/24/17  Loney Hering, MD  linaclotide (LINZESS) 290 MCG CAPS capsule Take 290 mcg by mouth daily before breakfast.    [provider]  lisinopril (PRINIVIL,ZESTRIL) 20 MG tablet Take 40 mg by mouth daily.     [provider]  Magnesium Oxide 500 MG CAPS Take 1 capsule (500 mg total) by mouth 2 (two) times daily at 8 am and 10 pm. 04/24/16   Milinda Pointer, MD  meloxicam (MOBIC) 7.5 MG tablet Take 1 tablet (7.5 mg total) by mouth daily. 02/11/17 02/18/17  Lannie Fields, [name]  metoCLOPramide (REGLAN) 10 MG tablet Take 1 tablet (10 mg total) by mouth every 6 (six) hours as needed. 01/01/17   Carrie Mew, MD  metoprolol tartrate (LOPRESSOR) 25 MG tablet Take 1 tablet (25 mg total) by mouth 2 (two) times daily. 08/03/15   Demetrios Loll, MD  nitroGLYCERIN (NITROSTAT) 0.4 MG SL tablet Place 1 tablet (0.4 mg total) under the tongue every 5 (five) minutes x 3 doses as needed for chest pain. 08/03/15   Demetrios Loll, MD  potassium chloride (KLOR-CON) 20 MEQ packet Take 20 mEq by mouth 2 (two) times daily.     [provider]  predniSONE (STERAPRED UNI-PAK 21 TAB) 10 MG (21) TBPK tablet Take 6 tablets on day 1 Take 5 tablets on day  2 Take 4 tablets on day 3 Take 3 tablets on day 4 Take 2 tablets on day 5 Take 1 tablet on day 6 02/21/17   Arlis Everly B, FNP  RANEXA 500 MG 12 hr tablet Take 1 tablet (500 mg total) by mouth 2 (two) times daily. 08/03/15   Demetrios Loll, MD  tiZANidine (ZANAFLEX) 4 MG tablet Take 1 tablet (4 mg total) by mouth 3 (three) times daily. 02/05/17   Johnn Hai, [name]    Allergies Levofloxacin; Flexeril [cyclobenzaprine]; Keflex [cephalexin]; Ketorolac; Phenergan [promethazine hcl]; Toradol [ketorolac tromethamine]; Tramadol; and Zoloft [sertraline hcl]  Family History  Problem Relation Age of Onset  . Heart disease Mother   . Lung cancer Mother   . Ovarian cancer Mother     Social History Social History  Substance Use Topics  . Smoking  status: Current Every Day Smoker    Packs/day: 0.50    Types: Cigarettes  . Smokeless tobacco: Never Used  . Alcohol use No    Review of Systems Constitutional: Negative for recent illness Cardiovascular: Negative for chest pain Respiratory: Negative for shortness breath Musculoskeletal: Positive for neck pain with radiation into arms, back, and legs Skin: Negative for injury  Neurological: Positive for radiculopathy  ____________________________________________   PHYSICAL EXAM:  VITAL SIGNS: ED Triage Vitals  Enc Vitals Group     BP 02/21/17 0908 116/88     Pulse Rate 02/21/17 0908 92     Resp 02/21/17 0908 20     Temp 02/21/17 0908 98 F (36.7 C)     Temp Source 02/21/17 0908 Oral     SpO2 02/21/17 0908 99 %     Weight 02/21/17 0908 185 lb (83.9 kg)     Height 02/21/17 0908 5\' 4"  (1.626 m)     Head Circumference --      Peak Flow --      Pain Score 02/21/17 0910 10     Pain Loc --      Pain Edu? --      Excl. in [place]? --     Constitutional: Alert and oriented. Well appearing and in no acute distress. Eyes: Conjunctivae are clear without discharge or drainage.  Head: Atraumatic Neck: Full range of motion observed during patient's demonstration of motions that induced pain Respiratory: Respirations even and unlabored Musculoskeletal: Paracervical tenderness and diffuse midline tenderness of the cervical spine on palpation Neurologic: Radicular symptoms go down both arms into the hands and fingers as well as down the back, hips and into both lower extremities.  Skin: Atraumatic  Psychiatric: Affect is flat. Behavior is appropriate.  ____________________________________________   LABS (all labs ordered are listed, but only abnormal results are displayed)  Labs Reviewed - No data to display ____________________________________________  RADIOLOGY  Mild divert generative disc disease at C6  -C7 ____________________________________________   PROCEDURES  Procedure(s) performed: None  ____________________________________________   INITIAL IMPRESSION / ASSESSMENT AND PLAN / ED COURSE  Sara Munoz is a 51 y.o. female who presents to the emergency department for evaluation and treatment of neck pain. Based on chart review, patient has a long-standing history of polysubstance abuse. She will not be given any controlled substances in the emergency department today. She will be given a prescription for prednisone and encouraged to follow up with her primary care provider at [facility] clinic. She was encouraged to return to the emergency department for symptoms that change or worsens unable schedule an appointment.  Pertinent labs & imaging results that were  available during my care of the patient were reviewed by me and considered in my medical decision making (see chart for details).  _________________________________________   FINAL CLINICAL IMPRESSION(S) / ED DIAGNOSES  Final diagnoses:  Radiculopathy of cervicothoracic region    New Prescriptions   PREDNISONE (STERAPRED UNI-PAK 21 TAB) 10 MG (21) TBPK TABLET    Take 6 tablets on day 1 Take 5 tablets on day 2 Take 4 tablets on day 3 Take 3 tablets on day 4 Take 2 tablets on day 5 Take 1 tablet on day 6    If controlled substance prescribed during this visit, 12 month history viewed on the [place] prior to issuing an initial prescription for Schedule II or III opiod.    Victorino Dike, FNP 02/21/17 1027    Lavonia Drafts, MD 02/21/17 1146

## 2017-02-21 NOTE — ED Triage Notes (Signed)
Patient arrives to [facility] ED with c/o chronic back and neck pain. Specifically cervical, thoracic, lumbar, sacral. >2 weeks in duration. Ambulatory at triage, NAD noted

## 2017-02-26 ENCOUNTER — Encounter: Payer: Self-pay | Admitting: Gastroenterology

## 2017-02-26 ENCOUNTER — Other Ambulatory Visit
Admission: RE | Admit: 2017-02-26 | Discharge: 2017-02-26 | Disposition: A | Discharge status: Home / Self Care | Payer: Medicaid Other | Source: Ambulatory Visit | Attending: Gastroenterology | Admitting: Gastroenterology

## 2017-02-26 ENCOUNTER — Other Ambulatory Visit: Payer: Self-pay

## 2017-02-26 ENCOUNTER — Ambulatory Visit (INDEPENDENT_AMBULATORY_CARE_PROVIDER_SITE_OTHER): Payer: Medicaid Other | Admitting: Gastroenterology

## 2017-02-26 VITALS — BP 113/80 | HR 96 | Temp 98.4°F | Ht 64.0 in | Wt 188.2 lb

## 2017-02-26 DIAGNOSIS — K5900 Constipation, unspecified: Secondary | ICD-10-CM | POA: Diagnosis not present

## 2017-02-26 DIAGNOSIS — B1920 Unspecified viral hepatitis C without hepatic coma: Secondary | ICD-10-CM | POA: Diagnosis not present

## 2017-02-26 DIAGNOSIS — B182 Chronic viral hepatitis C: Secondary | ICD-10-CM

## 2017-02-26 DIAGNOSIS — F17200 Nicotine dependence, unspecified, uncomplicated: Secondary | ICD-10-CM | POA: Diagnosis not present

## 2017-02-26 NOTE — Progress Notes (Signed)
[name] MD, MRCP(U.K) [address]  [place]  [place], [place] [address]  Main: [phone]  Fax: [phone]   Primary Care Physician: Patient, No Pcp Per  Primary Gastroenterologist:  Dr. [name]   No chief complaint on file.   HPI: Sara Munoz is a 51 y.o. female   She is here for follow up.She was last seen in 08/2016   Summary of history :  Sara R Dunlapwas initially referred back in 05/2016 for hepatitis C . Treatment naive. She said that her step brother had hepatitis C. She says she was a "big drinker" in the past and consumed a 12 pack twice a week for about "all my life".  At her last visit she continued to consume 1 case of alcohol a week . She denies any tatoos., no incarceration or Armed forces logistics/support/administrative officer. She used heroine last July , non since. Last used cocaine last year. No Armed forces logistics/support/administrative officer. Denies use of any herbal medications.   RUQ USG 05/07/16 showed hepatic steatosis.  Labs 08/2016 : TSH-Normal  HCV antibody positive HbsAg,Hep BcIGM,Hep BeAb/ag,HIV celiac serology,AMA,F actin -negative Ceruloplasmin,A1AT,LKMAb-negative  HCV RNA GT 3 AST 95, ALT 101,Hb 13.6.INR 0.89  Interval history 08/2016-02/2017   Labs 12/2016- Albumin 4.1,Hb 14.1 and platelet count 213.   She was seen in the ER on 08/06/16 when she presented with abdominal pain of 2 days duration .CT abdomen was negative for pancreatitis.  Lipase was normal . She was given morphine and discharged.   Colonoscopy 08/2016 - small adenomas excised   Since last visit- "hardly ever has a bowel movement " Unsure what changed. Consumes fruits and vegetables daily. Has tried enemas,colace, dulcolax. Not tried miralax. Feels better when she has a good bowel movement . No alcohol since her last visit almost 6 months.    Still drinking over the weekends 18 pack .  She say she has had rectal bleeding for a couple of months , blood on toilet paper and on the stool. Her uncle had colon cancer.  She has a bowel movement daily which is not hard.       Current Outpatient Prescriptions  Medication Sig Dispense Refill  . albuterol (PROVENTIL HFA;VENTOLIN HFA) 108 (90 Base) MCG/ACT inhaler Inhale 2 puffs into the lungs every 4 (four) hours as needed for wheezing or shortness of breath. 1 Inhaler 0  . albuterol (PROVENTIL HFA;VENTOLIN HFA) 108 (90 Base) MCG/ACT inhaler Inhale 2 puffs into the lungs every 6 (six) hours as needed for wheezing or shortness of breath. 1 Inhaler 2  . aspirin EC 81 MG tablet Take 81 mg by mouth daily.    Marland Kitchen atorvastatin (LIPITOR) 20 MG tablet Take 20 mg by mouth at bedtime.    . budesonide-formoterol (SYMBICORT) 160-4.5 MCG/ACT inhaler Inhale 2 puffs into the lungs 2 (two) times daily. 1 Inhaler 0  . busPIRone (BUSPAR) 7.5 MG tablet Take 7.5 mg by mouth 3 (three) times daily.     . busPIRone (BUSPAR) 7.5 MG tablet Take by mouth.    . dexlansoprazole (DEXILANT) 60 MG capsule Take 60 mg by mouth daily.    Marland Kitchen docusate sodium (COLACE) 100 MG capsule Take 200 mg by mouth 2 (two) times daily as needed for mild constipation.    . gabapentin (NEURONTIN) 300 MG capsule Take 2-3 capsules (600-900 mg total) by mouth 3 (three) times daily. 270 capsule 2  . HYDROcodone-acetaminophen (NORCO/VICODIN) 5-325 MG tablet Take 1 tablet by mouth every 6 (six) hours as needed for  moderate pain. 8 tablet 0  . lidocaine (LIDODERM) 5 % Place 1 patch onto the skin every 12 (twelve) hours. Remove & Discard patch within 12 hours or as directed by MD 10 patch 0  . linaclotide (LINZESS) 290 MCG CAPS capsule Take 290 mcg by mouth daily before breakfast.    . lisinopril (PRINIVIL,ZESTRIL) 20 MG tablet Take 40 mg by mouth daily.     . Magnesium Oxide 500 MG CAPS Take 1 capsule (500 mg total) by mouth 2 (two) times daily at 8 am and 10 pm. 100 capsule PRN  . meloxicam (MOBIC) 7.5 MG tablet Take 1 tablet (7.5 mg total) by mouth daily. 30 tablet 1  . metoCLOPramide (REGLAN) 10 MG tablet Take 1 tablet  (10 mg total) by mouth every 6 (six) hours as needed. 30 tablet 0  . metoprolol tartrate (LOPRESSOR) 25 MG tablet Take 1 tablet (25 mg total) by mouth 2 (two) times daily. 60 tablet 2  . nitroGLYCERIN (NITROSTAT) 0.4 MG SL tablet Place 1 tablet (0.4 mg total) under the tongue every 5 (five) minutes x 3 doses as needed for chest pain. 30 tablet 2  . potassium chloride (KLOR-CON) 20 MEQ packet Take 20 mEq by mouth 2 (two) times daily.     . predniSONE (STERAPRED UNI-PAK 21 TAB) 10 MG (21) TBPK tablet Take 6 tablets on day 1 Take 5 tablets on day 2 Take 4 tablets on day 3 Take 3 tablets on day 4 Take 2 tablets on day 5 Take 1 tablet on day 6 21 tablet 0  . RANEXA 500 MG 12 hr tablet Take 1 tablet (500 mg total) by mouth 2 (two) times daily. 60 tablet 2  . tiZANidine (ZANAFLEX) 4 MG tablet Take 1 tablet (4 mg total) by mouth 3 (three) times daily. 30 tablet 0   Current Facility-Administered Medications  Medication Dose Route Frequency Provider Last Rate Last Dose  . fentaNYL (SUBLIMAZE) injection 25-50 mcg  25-50 mcg Intravenous Q5 min PRN Milinda Pointer, MD      . lactated ringers infusion 1,000 mL  1,000 mL Intravenous Once Milinda Pointer, MD      . methylPREDNISolone acetate (DEPO-MEDROL) injection 80 mg  80 mg Intra-articular Once Milinda Pointer, MD      . midazolam (VERSED) 5 MG/5ML injection 1-2 mg  1-2 mg Intravenous Q5 min PRN Milinda Pointer, MD      . ropivacaine (PF) 2 mg/mL (0.2%) (NAROPIN) injection 4 mL  4 mL Intra-articular Once Milinda Pointer, MD      . ropivacaine (PF) 2 mg/mL (0.2%) (NAROPIN) injection 9 mL  9 mL Peri-NEURAL Once Milinda Pointer, MD      . ropivacaine (PF) 2 mg/mL (0.2%) (NAROPIN) injection 9 mL  9 mL Peri-NEURAL Once Milinda Pointer, MD      . triamcinolone acetonide (KENALOG-40) injection 40 mg  40 mg Other Once Milinda Pointer, MD      . triamcinolone acetonide (KENALOG-40) injection 40 mg  40 mg Other Once Milinda Pointer, MD         Allergies as of 02/26/2017 - Review Complete 02/21/2017  Allergen Reaction Noted  . Levofloxacin Hives and Swelling 02/27/2012  . Flexeril [cyclobenzaprine] Hives, Swelling, and Other (See Comments) 11/13/2013  . Keflex [cephalexin] Hives 10/22/2010  . Ketorolac  10/22/2010  . Phenergan [promethazine hcl] Other (See Comments) 02/21/2017  . Toradol [ketorolac tromethamine] Swelling and Other (See Comments) 01/17/2015  . Tramadol Hives, Swelling, and Other (See Comments) 01/17/2015  . Zoloft [sertraline hcl]  Swelling and Other (See Comments) 05/31/2015    ROS:  General: Negative for anorexia, weight loss, fever, chills, fatigue, weakness. ENT: Negative for hoarseness, difficulty swallowing , nasal congestion. CV: Negative for chest pain, angina, palpitations, dyspnea on exertion, peripheral edema.  Respiratory: Negative for dyspnea at rest, dyspnea on exertion, cough, sputum, wheezing.  GI: See history of present illness. GU:  Negative for dysuria, hematuria, urinary incontinence, urinary frequency, nocturnal urination.  Endo: Negative for unusual weight change.    Physical Examination:   There were no vitals taken for this visit.  General: Well-nourished, well-developed in no acute distress.  Eyes: No icterus. Conjunctivae pink. Mouth: Oropharyngeal mucosa moist and pink , no lesions erythema or exudate. Lungs: Clear to auscultation bilaterally. Non-labored. Heart: Regular rate and rhythm, no murmurs rubs or gallops.  Abdomen: Tenderness over rt lower ribs, Bowel sounds are normal, nontender, nondistended, no hepatosplenomegaly or masses, no abdominal bruits or hernia , no rebound or guarding.   Extremities: No lower extremity edema. No clubbing or deformities. Neuro: Alert and oriented x 3.  Grossly intact. Skin: Warm and dry, no jaundice.   Psych: Alert and cooperative, normal mood and affect.   Imaging Studies: Dg Cervical Spine 2-3 Views  Result Date:  02/21/2017 CLINICAL DATA:  Bilateral upper extremity radiculopathy EXAM: CERVICAL SPINE - 2-3 VIEW COMPARISON:  None. FINDINGS: There is no evidence of cervical spine fracture or prevertebral soft tissue swelling. Alignment is normal. No other significant bone abnormalities are identified. Mild degenerative disc disease with disc height loss at C6-7. IMPRESSION: Negative cervical spine radiographs. Electronically Signed   By: Kathreen Devoid   On: 02/21/2017 09:51    Assessment and Plan:   Sara Munoz is a 51 y.o. y/o female here to follow up  for hepatitis C. GT 3 No biochemical or radiological evidence of cirrhosis. Quit all alochol 6 months back. Present issues are back pain which is musculoskeletal and constipation likely secondary to opiod use   Plan  1. Stop all alcohol- has quit since last 6 months 2. Check immune status to hepatitis B and if negative will need Twinrix 3. Miralax for constipation  4. Still smoking - advised to stop  5. Limit use of narcotics.      Dr [name]  MD,MRCP [facility]) Follow up in 2-3 weeks

## 2017-03-07 ENCOUNTER — Telehealth: Payer: Self-pay | Admitting: Gastroenterology

## 2017-03-07 ENCOUNTER — Ambulatory Visit: Payer: Medicaid Other

## 2017-03-07 NOTE — Telephone Encounter (Signed)
Patient left a voice message that she didn't make the appt for her test this morning due to no power. Please call her to reschedule the appt. She stated you may leave a message on this number with her new appt time.  [phone]

## 2017-03-10 NOTE — Telephone Encounter (Signed)
Called to advise patient of rescheduled appointment for Liver elastography.  Friday, 9/21 @ 1015am @ [place] midnight.

## 2017-03-14 ENCOUNTER — Other Ambulatory Visit
Admission: RE | Admit: 2017-03-14 | Discharge: 2017-03-14 | Disposition: A | Discharge status: Home / Self Care | Payer: Medicaid Other | Source: Ambulatory Visit | Attending: Gastroenterology | Admitting: Gastroenterology

## 2017-03-14 ENCOUNTER — Ambulatory Visit
Admission: RE | Admit: 2017-03-14 | Discharge: 2017-03-14 | Disposition: A | Discharge status: Home / Self Care | Payer: Medicaid Other | Source: Ambulatory Visit | Attending: Gastroenterology | Admitting: Gastroenterology

## 2017-03-14 DIAGNOSIS — B1920 Unspecified viral hepatitis C without hepatic coma: Secondary | ICD-10-CM | POA: Insufficient documentation

## 2017-03-14 DIAGNOSIS — B182 Chronic viral hepatitis C: Secondary | ICD-10-CM

## 2017-03-14 LAB — URINE DRUG SCREEN, QUALITATIVE (ARMC ONLY)
Amphetamines, Ur Screen: NOT DETECTED
Barbiturates, Ur Screen: NOT DETECTED
Benzodiazepine, Ur Scrn: NOT DETECTED
Cannabinoid 50 Ng, Ur ~~LOC~~: NOT DETECTED
Cocaine Metabolite,Ur ~~LOC~~: NOT DETECTED
MDMA (Ecstasy)Ur Screen: NOT DETECTED
Methadone Scn, Ur: NOT DETECTED
Opiate, Ur Screen: NOT DETECTED
Phencyclidine (PCP) Ur S: NOT DETECTED
Tricyclic, Ur Screen: NOT DETECTED

## 2017-03-15 LAB — HEPATITIS B SURFACE ANTIBODY, QUANTITATIVE: Hepatitis B-Post: <3.1 m[IU]/mL — ABNORMAL LOW (ref >9.9)

## 2017-03-17 ENCOUNTER — Encounter: Payer: Self-pay | Admitting: Emergency Medicine

## 2017-03-17 ENCOUNTER — Emergency Department: Payer: Medicaid Other

## 2017-03-17 ENCOUNTER — Emergency Department
Admission: EM | Admit: 2017-03-17 | Discharge: 2017-03-17 | Disposition: A | Discharge status: Home / Self Care | Payer: Medicaid Other | Attending: Student in an Organized Health Care Education/Training Program | Admitting: Student in an Organized Health Care Education/Training Program

## 2017-03-17 DIAGNOSIS — F17210 Nicotine dependence, cigarettes, uncomplicated: Secondary | ICD-10-CM | POA: Insufficient documentation

## 2017-03-17 DIAGNOSIS — J449 Chronic obstructive pulmonary disease, unspecified: Secondary | ICD-10-CM | POA: Insufficient documentation

## 2017-03-17 DIAGNOSIS — Z79899 Other long term (current) drug therapy: Secondary | ICD-10-CM | POA: Diagnosis not present

## 2017-03-17 DIAGNOSIS — K5900 Constipation, unspecified: Secondary | ICD-10-CM | POA: Diagnosis not present

## 2017-03-17 DIAGNOSIS — I249 Acute ischemic heart disease, unspecified: Secondary | ICD-10-CM | POA: Diagnosis not present

## 2017-03-17 DIAGNOSIS — R1012 Left upper quadrant pain: Secondary | ICD-10-CM | POA: Insufficient documentation

## 2017-03-17 DIAGNOSIS — J45909 Unspecified asthma, uncomplicated: Secondary | ICD-10-CM | POA: Insufficient documentation

## 2017-03-17 DIAGNOSIS — I10 Essential (primary) hypertension: Secondary | ICD-10-CM | POA: Insufficient documentation

## 2017-03-17 DIAGNOSIS — Z7982 Long term (current) use of aspirin: Secondary | ICD-10-CM | POA: Diagnosis not present

## 2017-03-17 LAB — URINALYSIS, COMPLETE (UACMP) WITH MICROSCOPIC
Bilirubin Urine: NEGATIVE
Glucose, UA: NEGATIVE mg/dL
Hgb urine dipstick: NEGATIVE
Ketones, ur: NEGATIVE mg/dL
Nitrite: NEGATIVE
Protein, ur: NEGATIVE mg/dL
Specific Gravity, Urine: 1.004 — ABNORMAL LOW (ref 1.005–1.030)
pH: 6 (ref 5.0–8.0)

## 2017-03-17 LAB — COMPREHENSIVE METABOLIC PANEL
ALT: 81 U/L — ABNORMAL HIGH (ref 14–54)
AST: 90 U/L — ABNORMAL HIGH (ref 15–41)
Albumin: 3.9 g/dL (ref 3.5–5.0)
Alkaline Phosphatase: 64 U/L (ref 38–126)
Anion gap: 7 (ref 5–15)
BUN: 11 mg/dL (ref 6–20)
CO2: 25 mmol/L (ref 22–32)
Calcium: 9.5 mg/dL (ref 8.9–10.3)
Chloride: 107 mmol/L (ref 101–111)
Creatinine, Ser: 0.71 mg/dL (ref 0.44–1.00)
GFR calc Af Amer: >60 mL/min (ref >60)
GFR calc non Af Amer: >60 mL/min (ref >60)
Glucose, Bld: 101 mg/dL — ABNORMAL HIGH (ref 65–99)
Potassium: 4 mmol/L (ref 3.5–5.1)
Sodium: 139 mmol/L (ref 135–145)
Total Bilirubin: 0.4 mg/dL (ref 0.3–1.2)
Total Protein: 7.4 g/dL (ref 6.5–8.1)

## 2017-03-17 LAB — CBC
HCT: 39.9 % (ref 35.0–47.0)
Hemoglobin: 13.6 g/dL (ref 12.0–16.0)
MCH: 29.5 pg (ref 26.0–34.0)
MCHC: 34.1 g/dL (ref 32.0–36.0)
MCV: 86.5 fL (ref 80.0–100.0)
Platelets: 231 10*3/uL (ref 150–440)
RBC: 4.62 MIL/uL (ref 3.80–5.20)
RDW: 18.8 % — ABNORMAL HIGH (ref 11.5–14.5)
WBC: 9.2 10*3/uL (ref 3.6–11.0)

## 2017-03-17 LAB — LIPASE, BLOOD: Lipase: 53 U/L — ABNORMAL HIGH (ref 11–51)

## 2017-03-17 LAB — PREGNANCY, URINE: Preg Test, Ur: NEGATIVE

## 2017-03-17 MED ORDER — DICYCLOMINE HCL 10 MG PO CAPS: 10.0000 mg | ORAL_CAPSULE | Freq: Three times a day (TID) | ORAL | 0 refills | Status: DC | PRN

## 2017-03-17 MED ORDER — MORPHINE SULFATE (PF) 4 MG/ML IV SOLN
4.0000 mg | INTRAVENOUS | Status: DC | PRN
  Administered 2017-03-17: 4 mg via INTRAVENOUS
  Filled 2017-03-17: qty 1

## 2017-03-17 MED ORDER — SODIUM CHLORIDE 0.9 % IV BOLUS (SEPSIS)
1000.0000 mL | Freq: Once | INTRAVENOUS | Status: AC
  Administered 2017-03-17: 1000 mL via INTRAVENOUS

## 2017-03-17 MED ORDER — PROMETHAZINE HCL 25 MG/ML IJ SOLN
12.5000 mg | Freq: Four times a day (QID) | INTRAMUSCULAR | Status: DC | PRN
  Administered 2017-03-17: 12.5 mg via INTRAVENOUS
  Filled 2017-03-17: qty 1

## 2017-03-17 MED ORDER — IOPAMIDOL (ISOVUE-300) INJECTION 61%
100.0000 mL | Freq: Once | INTRAVENOUS | Status: AC | PRN
  Administered 2017-03-17: 100 mL via INTRAVENOUS

## 2017-03-17 MED ORDER — NITROFURANTOIN MONOHYD MACRO 100 MG PO CAPS: 100.0000 mg | ORAL_CAPSULE | Freq: Two times a day (BID) | ORAL | 0 refills | Status: AC

## 2017-03-17 MED ORDER — LACTULOSE 10 G PO PACK: 10.0000 g | PACK | Freq: Three times a day (TID) | ORAL | 0 refills | Status: DC

## 2017-03-17 MED ORDER — DICYCLOMINE HCL 10 MG PO CAPS
10.0000 mg | ORAL_CAPSULE | Freq: Once | ORAL | Status: AC
  Administered 2017-03-17: 10 mg via ORAL
  Filled 2017-03-17: qty 1

## 2017-03-17 NOTE — ED Provider Notes (Signed)
[facility] Emergency Department Provider Note    First MD Initiated Contact with Patient 03/17/17 2003     (approximate)  I have reviewed the triage vital signs and the nursing notes.   HISTORY  Chief Complaint Abdominal Pain    HPI Sara Munoz is a 51 y.o. 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 No fevers. No shortness of breath. No chest pain. Pain does radiate through to her back. Denies any diarrhea. No dysuria. No hematuria. States the pain is 1010 in severity without any improvement. No change with eating food.   Past Medical History:  Diagnosis Date  . Anxiety   . Asthma   . Atypical chest pain 08/02/2015  . CAD (coronary artery disease)   . Chronic back pain   . COPD (chronic obstructive pulmonary disease) ([place])   . Coronary artery disease    a. Mild-non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/2014  . GERD (gastroesophageal reflux disease)   . Hypercholesteremia   . Hypertension   . Hypokalemia   . Hypomagnesemia 01/04/2014  . Liver disease   . MI (myocardial infarction) ([place])   . Osteoarthritis    Family History  Problem Relation Age of Onset  . Heart disease Mother   . Lung cancer Mother   . Ovarian cancer Mother    Past Surgical History:  Procedure Laterality Date  . CARDIAC CATHETERIZATION Left 02/22/2016   Procedure: Left Heart Cath and Coronary Angiography;  Surgeon: Yolonda Kida, MD;  Location: [facility];  Service: Cardiovascular;  Laterality: Left;  . COLONOSCOPY WITH PROPOFOL N/A 09/19/2016   Procedure: COLONOSCOPY WITH PROPOFOL;  Surgeon: [name], MD;  Location: [facility];  Service: Endoscopy;  Laterality: N/A;  . hemorroids    . ORIF FEMUR FRACTURE    . ORIF TIBIA & FIBULA FRACTURES    . OVARY SURGERY     Patient Active Problem List   Diagnosis Date Noted  .  Neurogenic pain 07/31/2016  . Chronic pain syndrome 06/05/2016  . Carotid artery stenosis 02/27/2016  . Chronic constipation 02/27/2016  . Chronic nausea 02/27/2016  . Coronary artery disease 02/27/2016  . Hypercholesterolemia 02/27/2016  . Osteoarthritis 02/27/2016  . PTSD (post-traumatic stress disorder) 02/27/2016  . TIA (transient ischemic attack) 02/27/2016  . Long term current use of opiate analgesic 02/27/2016  . Long term prescription opiate use 02/27/2016  . Opiate use 02/27/2016  . Encounter for therapeutic drug level monitoring 02/27/2016  . Encounter for pain management consult 02/27/2016  . Chronic hip pain (Location of Tertiary source of pain) (Bilateral) (L>R) 02/27/2016  . Chronic knee pain (Bilateral) (L>R) 02/27/2016  . Chronic shoulder pain (Bilateral) (L>R) 02/27/2016  . Chronic sacroiliac joint pain (Bilateral) (L>R) 02/27/2016  . Chronic low back pain (Location of Primary Source of Pain) (Bilateral) (L>R) 02/27/2016  . Chronic lower extremity pain (Location of Secondary source of pain) (Bilateral) (L>R) 02/27/2016  . Osteoarthritis of hip (Bilateral) (L>R) 02/27/2016  . Chronic neck pain (posterior midline) (Bilateral) (L>R) 02/27/2016  . Cervicogenic headache (Bilateral) (L>R) 02/27/2016  . Occipital headache (Bilateral) (L>R) 02/27/2016  . Chronic upper extremity pain (Bilateral) (L>R) 02/27/2016  . Chronic Cervical radicular pain (Bilateral) (L>R) 02/27/2016  . Chronic lumbar radicular pain (Right) (L5 dermatome) 02/27/2016  . Lumbar facet syndrome (Bilateral) (L>R) 02/27/2016  . Long term prescription benzodiazepine use 02/27/2016  . Hypertension   .  COPD (chronic obstructive pulmonary disease) ([place]) 10/09/2015  . GERD (gastroesophageal reflux disease) 10/09/2015  . Non-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/2014 08/02/2015  . Hyperlipidemia 08/02/2015  . Costochondritis 08/02/2015  . 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 GAD (generalized anxiety disorder) 09/03/2014  .  Narcotic dependence ([place]) 09/03/2014  . PVD (peripheral vascular disease) ([place]) 09/03/2014  . Smoker 09/03/2014  . Cigarette nicotine dependence with nicotine-induced disorder 07/08/2014  . Anemia of chronic disease 03/19/2014  . Narcotic abuse, continuous 03/19/2014  . Polysubstance abuse 03/19/2014  . MSSA (methicillin susceptible Staphylococcus aureus) septicemia ([place]) 03/07/2014  . Hypomagnesemia 01/04/2014  . Chronic cough 09/04/2011  . Sleep apnea, obstructive 09/04/2011      Prior to Admission medications   Medication Sig Start Date End Date Taking? Authorizing Provider  albuterol (PROVENTIL HFA;VENTOLIN HFA) 108 (90 Base) MCG/ACT inhaler Inhale 2 puffs into the lungs every 4 (four) hours as needed for wheezing or shortness of breath. 08/03/15   Demetrios Loll, MD  albuterol (PROVENTIL HFA;VENTOLIN HFA) 108 [phone] Base) MCG/ACT inhaler Inhale 2 puffs into the lungs every 6 (six) hours as needed for wheezing or shortness of breath. 10/10/16   Gregor Hams, MD  aspirin EC 81 MG tablet Take 81 mg by mouth daily.    [provider]  atorvastatin (LIPITOR) 20 MG tablet Take 20 mg by mouth at bedtime.    [provider]  budesonide-formoterol (SYMBICORT) 160-4.5 MCG/ACT inhaler Inhale 2 puffs into the lungs 2 (two) times daily. 05/31/15   Gregor Hams, MD  busPIRone (BUSPAR) 7.5 MG tablet Take 7.5 mg by mouth 3 (three) times daily.     [provider]  dexlansoprazole (DEXILANT) 60 MG capsule Take 60 mg by mouth daily.    [provider]  docusate sodium (COLACE) 100 MG capsule Take 200 mg by mouth 2 (two) times daily as needed for mild constipation.    [provider]  gabapentin (NEURONTIN) 300 MG capsule Take 2-3 capsules (600-900 mg total) by mouth 3 (three) times daily. 07/31/16 10/29/16  Milinda Pointer, MD  lisinopril (PRINIVIL,ZESTRIL) 20 MG tablet Take 40 mg by mouth daily.     [provider]  Magnesium Oxide 500 MG CAPS Take 1  capsule (500 mg total) by mouth 2 (two) times daily at 8 am and 10 pm. Patient not taking: Reported on 02/26/2017 04/24/16   Milinda Pointer, MD  metoCLOPramide (REGLAN) 10 MG tablet Take 1 tablet (10 mg total) by mouth every 6 (six) hours as needed. 01/01/17   Carrie Mew, MD  metoprolol tartrate (LOPRESSOR) 25 MG tablet Take 1 tablet (25 mg total) by mouth 2 (two) times daily. 08/03/15   Demetrios Loll, MD  nitroGLYCERIN (NITROSTAT) 0.4 MG SL tablet Place 1 tablet (0.4 mg total) under the tongue every 5 (five) minutes x 3 doses as needed for chest pain. 08/03/15   Demetrios Loll, MD  potassium chloride (KLOR-CON) 20 MEQ packet Take 20 mEq by mouth 2 (two) times daily.     [provider]  RANEXA 500 MG 12 hr tablet Take 1 tablet (500 mg total) by mouth 2 (two) times daily. 08/03/15   Demetrios Loll, MD  tiZANidine (ZANAFLEX) 4 MG tablet Take 1 tablet (4 mg total) by mouth 3 (three) times daily. 02/05/17   Johnn Hai, [name]    Allergies Levofloxacin; Flexeril [cyclobenzaprine]; Keflex [cephalexin]; Ketorolac; Phenergan [promethazine hcl]; Toradol [ketorolac tromethamine]; Tramadol; and Zoloft [sertraline hcl]    Social History Social History  Substance Use  Topics  . Smoking status: Current Every Day Smoker    Packs/day: 0.50    Types: Cigarettes  . Smokeless tobacco: Never Used  . Alcohol use No    Review of Systems Patient denies headaches, rhinorrhea, blurry vision, numbness, shortness of breath, chest pain, edema, cough, abdominal pain, nausea, vomiting, diarrhea, dysuria, fevers, rashes or hallucinations unless otherwise stated above in HPI. ____________________________________________   PHYSICAL EXAM:  VITAL SIGNS: Vitals:   03/17/17 1956  Pulse: 75  Resp: 17  Temp: 98.7 F (37.1 C)  SpO2: 98%    Constitutional: Alert and oriented.  in no acute distress. Eyes: Conjunctivae are normal.  Head: Atraumatic. Nose: No congestion/rhinnorhea. Mouth/Throat: Mucous  membranes are moist.   Neck: No stridor. Painless ROM.  Cardiovascular: Normal rate, regular rhythm. Grossly normal heart sounds.  Good peripheral circulation. Respiratory: Normal respiratory effort.  No retractions. Lungs CTAB. Gastrointestinal: Soft and nontender. No distention. No abdominal bruits. No CVA tenderness. Musculoskeletal: No lower extremity tenderness nor edema.  No joint effusions. Neurologic:  Normal speech and language. No gross focal neurologic deficits are appreciated. No facial droop Skin:  Skin is warm, dry and intact. No rash noted. Psychiatric: Mood and affect are normal. Speech and behavior are normal.  ____________________________________________   LABS (all labs ordered are listed, but only abnormal results are displayed)  Results for orders placed or performed during the hospital encounter of 03/17/17 (from the past 24 hour(s))  Lipase, blood     Status: Abnormal   Collection Time: 03/17/17  7:56 PM  Result Value Ref Range   Lipase 53 (H) 11 - 51 U/L  Comprehensive metabolic panel     Status: Abnormal   Collection Time: 03/17/17  7:56 PM  Result Value Ref Range   Sodium 139 135 - 145 mmol/L   Potassium 4.0 3.5 - 5.1 mmol/L   Chloride 107 101 - 111 mmol/L   CO2 25 22 - 32 mmol/L   Glucose, Bld 101 (H) 65 - 99 mg/dL   BUN 11 6 - 20 mg/dL   Creatinine, Ser 0.71 0.44 - 1.00 mg/dL   Calcium 9.5 8.9 - 10.3 mg/dL   Total Protein 7.4 6.5 - 8.1 g/dL   Albumin 3.9 3.5 - 5.0 g/dL   AST 90 (H) 15 - 41 U/L   ALT 81 (H) 14 - 54 U/L   Alkaline Phosphatase 64 38 - 126 U/L   Total Bilirubin 0.4 0.3 - 1.2 mg/dL   GFR calc non Af Amer >60 >60 mL/min   GFR calc Af Amer >60 >60 mL/min   Anion gap 7 5 - 15  CBC     Status: Abnormal   Collection Time: 03/17/17  7:56 PM  Result Value Ref Range   WBC 9.2 3.6 - 11.0 K/uL   RBC 4.62 3.80 - 5.20 MIL/uL   Hemoglobin 13.6 12.0 - 16.0 g/dL   HCT 39.9 35.0 - 47.0 %   MCV 86.5 80.0 - 100.0 fL   MCH 29.5 26.0 - 34.0 pg    MCHC 34.1 32.0 - 36.0 g/dL   RDW 18.8 (H) 11.5 - 14.5 %   Platelets 231 150 - 440 K/uL  Urinalysis, Complete w Microscopic     Status: Abnormal   Collection Time: 03/17/17  7:56 PM  Result Value Ref Range   Color, Urine STRAW (A) YELLOW   APPearance CLEAR (A) CLEAR   Specific Gravity, Urine 1.004 (L) 1.005 - 1.030   pH 6.0 5.0 - 8.0   Glucose,  UA NEGATIVE NEGATIVE mg/dL   Hgb urine dipstick NEGATIVE NEGATIVE   Bilirubin Urine NEGATIVE NEGATIVE   Ketones, ur NEGATIVE NEGATIVE mg/dL   Protein, ur NEGATIVE NEGATIVE mg/dL   Nitrite NEGATIVE NEGATIVE   Leukocytes, UA TRACE (A) NEGATIVE   RBC / HPF 0-5 0 - 5 RBC/hpf   WBC, UA 6-30 0 - 5 WBC/hpf   Bacteria, UA MANY (A) NONE SEEN   Squamous Epithelial / LPF 0-5 (A) NONE SEEN  Pregnancy, urine     Status: None   Collection Time: 03/17/17  7:56 PM  Result Value Ref Range   Preg Test, Ur NEGATIVE NEGATIVE   ____________________________________________  EKG____________________________________________  RADIOLOGY  I personally reviewed all radiographic images ordered to evaluate for the above acute complaints and reviewed radiology reports and findings.  These findings were personally discussed with the patient.  Please see medical record for radiology report.  ____________________________________________   PROCEDURES  Procedure(s) performed:  Procedures    Critical Care performed: no ____________________________________________   INITIAL IMPRESSION / ASSESSMENT AND PLAN / ED COURSE  Pertinent labs & imaging results that were available during my care of the patient were reviewed by me and considered in my medical decision making (see chart for details).  DDX: colitis, gastritis, pancreatitis, cholelithiasis, cholecystitis, uti, stone, sbo  Sara Munoz is a 51 y.o. who presents to the ED with Symptoms as described above. Patient afebrile and hemodynamically stable and well perfused. Does have some abdominal pain and given  her complex past medical history CT imaging ordered for the above differential.  She denies any dysuria but does admit to increased urinary frequency. A urinalysis does show evidence of bacteria. Has mildly cytosis but no other findings to suggest sepsis. Lipase is borderline elevated. Not consistent with pancreatitis. CT imaging shows no acute abnormality but does show large stool burden. Do suspect large component of this is secondary to constipation.  Patient was able to tolerate PO and was able to ambulate with a steady gait.  Have discussed with the patient and available family all diagnostics and treatments performed thus far and all questions were answered to the best of my ability. The patient demonstrates understanding and agreement with plan.       ____________________________________________   FINAL CLINICAL IMPRESSION(S) / ED DIAGNOSES  Final diagnoses:  Left upper quadrant pain  Constipation, unspecified constipation type      NEW MEDICATIONS STARTED DURING THIS VISIT:  New Prescriptions   No medications on file     Note:  This document was prepared using Dragon voice recognition software and may include unintentional dictation errors.    Merlyn Lot, MD 03/17/17 2157

## 2017-03-17 NOTE — ED Triage Notes (Signed)
Patient presents to ED via POV from home with c/o abdominal pain x 1 week. Patient reports N/V. Hx of liver disease and pancreatitis. Patient denies any alcohol use or eating any spicy food.

## 2017-03-17 NOTE — Discharge Instructions (Signed)

## 2017-03-19 ENCOUNTER — Telehealth: Payer: Self-pay | Admitting: Gastroenterology

## 2017-03-19 NOTE — Telephone Encounter (Signed)
Patient would like to get her results. Her phone will be turned off until Monday. Please call then

## 2017-03-24 LAB — HCV RT-PCR, QUANT (NON-GRAPH)
HCV Log 10: UNDETERMINED Log10copy/mL
IU log10: UNDETERMINED Log10 IU/mL

## 2017-03-25 ENCOUNTER — Other Ambulatory Visit: Payer: Self-pay

## 2017-04-01 ENCOUNTER — Telehealth: Payer: Self-pay

## 2017-04-01 DIAGNOSIS — B1711 Acute hepatitis C with hepatic coma: Secondary | ICD-10-CM

## 2017-04-01 NOTE — Telephone Encounter (Signed)
Advised patient lab requested per Dr. Vicente Males.

## 2017-04-22 ENCOUNTER — Ambulatory Visit: Payer: Medicaid Other | Admitting: Gastroenterology

## 2017-05-02 ENCOUNTER — Emergency Department: Payer: Medicaid Other

## 2017-05-02 ENCOUNTER — Encounter: Payer: Self-pay | Admitting: Emergency Medicine

## 2017-05-02 ENCOUNTER — Emergency Department
Admission: EM | Admit: 2017-05-02 | Discharge: 2017-05-02 | Disposition: A | Discharge status: Home / Self Care | Payer: Medicaid Other | Attending: Emergency Medicine | Admitting: Emergency Medicine

## 2017-05-02 ENCOUNTER — Other Ambulatory Visit: Payer: Self-pay

## 2017-05-02 DIAGNOSIS — Z7982 Long term (current) use of aspirin: Secondary | ICD-10-CM | POA: Insufficient documentation

## 2017-05-02 DIAGNOSIS — Y92009 Unspecified place in unspecified non-institutional (private) residence as the place of occurrence of the external cause: Secondary | ICD-10-CM

## 2017-05-02 DIAGNOSIS — F1190 Opioid use, unspecified, uncomplicated: Secondary | ICD-10-CM | POA: Insufficient documentation

## 2017-05-02 DIAGNOSIS — W010XXA Fall on same level from slipping, tripping and stumbling without subsequent striking against object, initial encounter: Secondary | ICD-10-CM | POA: Insufficient documentation

## 2017-05-02 DIAGNOSIS — F419 Anxiety disorder, unspecified: Secondary | ICD-10-CM | POA: Insufficient documentation

## 2017-05-02 DIAGNOSIS — Y998 Other external cause status: Secondary | ICD-10-CM | POA: Diagnosis not present

## 2017-05-02 DIAGNOSIS — F17210 Nicotine dependence, cigarettes, uncomplicated: Secondary | ICD-10-CM | POA: Insufficient documentation

## 2017-05-02 DIAGNOSIS — F1910 Other psychoactive substance abuse, uncomplicated: Secondary | ICD-10-CM | POA: Insufficient documentation

## 2017-05-02 DIAGNOSIS — Z79899 Other long term (current) drug therapy: Secondary | ICD-10-CM | POA: Diagnosis not present

## 2017-05-02 DIAGNOSIS — I10 Essential (primary) hypertension: Secondary | ICD-10-CM | POA: Diagnosis not present

## 2017-05-02 DIAGNOSIS — Y92018 Other place in single-family (private) house as the place of occurrence of the external cause: Secondary | ICD-10-CM | POA: Insufficient documentation

## 2017-05-02 DIAGNOSIS — S59911A Unspecified injury of right forearm, initial encounter: Secondary | ICD-10-CM | POA: Diagnosis present

## 2017-05-02 DIAGNOSIS — I252 Old myocardial infarction: Secondary | ICD-10-CM | POA: Insufficient documentation

## 2017-05-02 DIAGNOSIS — J45909 Unspecified asthma, uncomplicated: Secondary | ICD-10-CM | POA: Diagnosis not present

## 2017-05-02 DIAGNOSIS — J449 Chronic obstructive pulmonary disease, unspecified: Secondary | ICD-10-CM | POA: Insufficient documentation

## 2017-05-02 DIAGNOSIS — I2510 Atherosclerotic heart disease of native coronary artery without angina pectoris: Secondary | ICD-10-CM | POA: Diagnosis not present

## 2017-05-02 DIAGNOSIS — S5011XA Contusion of right forearm, initial encounter: Secondary | ICD-10-CM | POA: Diagnosis not present

## 2017-05-02 DIAGNOSIS — Y9389 Activity, other specified: Secondary | ICD-10-CM | POA: Insufficient documentation

## 2017-05-02 DIAGNOSIS — W19XXXA Unspecified fall, initial encounter: Secondary | ICD-10-CM

## 2017-05-02 MED ORDER — HYDROCODONE-ACETAMINOPHEN 5-325 MG PO TABS: 1.0000 | ORAL_TABLET | Freq: Four times a day (QID) | ORAL | 0 refills | Status: DC | PRN

## 2017-05-02 NOTE — ED Provider Notes (Signed)
[facility] Emergency Department Provider Note  ____________________________________________   First MD Initiated Contact with Patient 05/02/17 [phone]     (approximate)  I have reviewed the triage vital signs and the nursing notes.   HISTORY  Chief Complaint Fall   HPI Sara Munoz is a 51 y.o. female is a complaint of right forearm pain after falling on her porch yesterday. Patient states that she is unable to use her arm secondary to her pain.patient denies any head injury or loss of consciousness. Patient states she has taken over-the-counter medications without any relief of her pain.she rates her pain as 10 over 10. Patient drove to the emergency department today.   Past Medical History:  Diagnosis Date  . Anxiety   . Asthma   . Atypical chest pain 08/02/2015  . CAD (coronary artery disease)   . Chronic back pain   . COPD (chronic obstructive pulmonary disease) ([place])   . Coronary artery disease    a. Mild-non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/2014  . GERD (gastroesophageal reflux disease)   . Hypercholesteremia   . Hypertension   . Hypokalemia   . Hypomagnesemia 01/04/2014  . Liver disease   . MI (myocardial infarction) ([place])   . Osteoarthritis     Patient Active Problem List   Diagnosis Date Noted  . Neurogenic pain 07/31/2016  . Chronic pain syndrome 06/05/2016  . Carotid artery stenosis 02/27/2016  . Chronic constipation 02/27/2016  . Chronic nausea 02/27/2016  . Coronary artery disease 02/27/2016  . Hypercholesterolemia 02/27/2016  . Osteoarthritis 02/27/2016  . PTSD (post-traumatic stress disorder) 02/27/2016  . TIA (transient ischemic attack) 02/27/2016  . Long term current use of opiate analgesic 02/27/2016  . Long term prescription opiate use 02/27/2016  . Opiate use 02/27/2016  . Encounter for therapeutic drug level monitoring 02/27/2016  . Encounter for pain management consult 02/27/2016  . Chronic hip pain (Location of  Tertiary source of pain) (Bilateral) (L>R) 02/27/2016  . Chronic knee pain (Bilateral) (L>R) 02/27/2016  . Chronic shoulder pain (Bilateral) (L>R) 02/27/2016  . Chronic sacroiliac joint pain (Bilateral) (L>R) 02/27/2016  . Chronic low back pain (Location of Primary Source of Pain) (Bilateral) (L>R) 02/27/2016  . Chronic lower extremity pain (Location of Secondary source of pain) (Bilateral) (L>R) 02/27/2016  . Osteoarthritis of hip (Bilateral) (L>R) 02/27/2016  . Chronic neck pain (posterior midline) (Bilateral) (L>R) 02/27/2016  . Cervicogenic headache (Bilateral) (L>R) 02/27/2016  . Occipital headache (Bilateral) (L>R) 02/27/2016  . Chronic upper extremity pain (Bilateral) (L>R) 02/27/2016  . Chronic Cervical radicular pain (Bilateral) (L>R) 02/27/2016  . Chronic lumbar radicular pain (Right) (L5 dermatome) 02/27/2016  . Lumbar facet syndrome (Bilateral) (L>R) 02/27/2016  . Long term prescription benzodiazepine use 02/27/2016  . Hypertension   . COPD (chronic obstructive pulmonary disease) ([place]) 10/09/2015  . GERD (gastroesophageal reflux disease) 10/09/2015  . Non-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/2014 08/02/2015  . Hyperlipidemia 08/02/2015  . Costochondritis 08/02/2015  . Drug abuse, IV ([place]) 09/03/2014  . GAD (generalized anxiety disorder) 09/03/2014  . Narcotic dependence ([place]) 09/03/2014  . PVD (peripheral vascular disease) ([place]) 09/03/2014  . Smoker 09/03/2014  . Cigarette nicotine dependence with nicotine-induced disorder 07/08/2014  . Anemia of chronic disease 03/19/2014  . Narcotic abuse, continuous ([place]) 03/19/2014  . Polysubstance abuse ([place]) 03/19/2014  . MSSA (methicillin susceptible Staphylococcus aureus) septicemia ([place]) 03/07/2014  . Hypomagnesemia 01/04/2014  . Chronic cough 09/04/2011  . Sleep apnea, obstructive 09/04/2011    Past Surgical History:  Procedure  Laterality Date  . hemorroids    . ORIF FEMUR FRACTURE    . ORIF TIBIA & FIBULA FRACTURES    . OVARY  SURGERY      Prior to Admission medications   Medication Sig Start Date End Date Taking? Authorizing Provider  albuterol (PROVENTIL HFA;VENTOLIN HFA) 108 (90 Base) MCG/ACT inhaler Inhale 2 puffs into the lungs every 4 (four) hours as needed for wheezing or shortness of breath. 08/03/15   Demetrios Loll, MD  albuterol (PROVENTIL HFA;VENTOLIN HFA) 108 [phone] Base) MCG/ACT inhaler Inhale 2 puffs into the lungs every 6 (six) hours as needed for wheezing or shortness of breath. 10/10/16   Gregor Hams, MD  aspirin EC 81 MG tablet Take 81 mg by mouth daily.    [provider]  atorvastatin (LIPITOR) 20 MG tablet Take 20 mg by mouth at bedtime.    [provider]  budesonide-formoterol (SYMBICORT) 160-4.5 MCG/ACT inhaler Inhale 2 puffs into the lungs 2 (two) times daily. 05/31/15   Gregor Hams, MD  busPIRone (BUSPAR) 7.5 MG tablet Take 7.5 mg by mouth 3 (three) times daily.     [provider]  dexlansoprazole (DEXILANT) 60 MG capsule Take 60 mg by mouth daily.    [provider]  dicyclomine (BENTYL) 10 MG capsule Take 1 capsule (10 mg total) by mouth 3 (three) times daily as needed for spasms. 03/17/17 03/31/17  Merlyn Lot, MD  docusate sodium (COLACE) 100 MG capsule Take 200 mg by mouth 2 (two) times daily as needed for mild constipation.    [provider]  gabapentin (NEURONTIN) 300 MG capsule Take 2-3 capsules (600-900 mg total) by mouth 3 (three) times daily. 07/31/16 10/29/16  Milinda Pointer, MD  HYDROcodone-acetaminophen (NORCO/VICODIN) 5-325 MG tablet Take 1 tablet every 6 (six) hours as needed by mouth for moderate pain. 05/02/17   Johnn Hai, [name]  lactulose (CEPHULAC) 10 g packet Take 1 packet (10 g total) by mouth 3 (three) times daily. 03/17/17   Merlyn Lot, MD  lisinopril (PRINIVIL,ZESTRIL) 20 MG tablet Take 40 mg by mouth daily.     [provider]  metoCLOPramide (REGLAN) 10 MG tablet Take 1 tablet (10 mg total) by  mouth every 6 (six) hours as needed. 01/01/17   Carrie Mew, MD  metoprolol tartrate (LOPRESSOR) 25 MG tablet Take 1 tablet (25 mg total) by mouth 2 (two) times daily. 08/03/15   Demetrios Loll, MD  nitroGLYCERIN (NITROSTAT) 0.4 MG SL tablet Place 1 tablet (0.4 mg total) under the tongue every 5 (five) minutes x 3 doses as needed for chest pain. 08/03/15   Demetrios Loll, MD  potassium chloride (KLOR-CON) 20 MEQ packet Take 20 mEq by mouth 2 (two) times daily.     [provider]  RANEXA 500 MG 12 hr tablet Take 1 tablet (500 mg total) by mouth 2 (two) times daily. 08/03/15   Demetrios Loll, MD  tiZANidine (ZANAFLEX) 4 MG tablet Take 1 tablet (4 mg total) by mouth 3 (three) times daily. 02/05/17   Johnn Hai, [name]    Allergies Levofloxacin; Flexeril [cyclobenzaprine]; Keflex [cephalexin]; Ketorolac; Phenergan [promethazine hcl]; Toradol [ketorolac tromethamine]; Tramadol; and Zoloft [sertraline hcl]  Family History  Problem Relation Age of Onset  . Heart disease Mother   . Lung cancer Mother   . Ovarian cancer Mother     Social History Social History   Tobacco Use  . Smoking status: Current Every Day Smoker    Packs/day: 0.50  Types: Cigarettes  . Smokeless tobacco: Never Used  Substance Use Topics  . Alcohol use: No    Alcohol/week: 0.0 oz  . Drug use: Yes    Types: Marijuana    Review of Systems Constitutional: No fever/chills Eyes: No visual changes. ENT: no trauma Cardiovascular: Denies chest pain. Respiratory: Denies shortness of breath. Gastrointestinal: No abdominal pain.  No nausea, no vomiting.   Musculoskeletal: positive for right forearm pain. Skin: positive for bruising. Neurological: Negative for headaches, focal weakness or numbness. ____________________________________________   PHYSICAL EXAM:  VITAL SIGNS: ED Triage Vitals  Enc Vitals Group     BP 05/02/17 0837 (!) 137/91     Pulse Rate 05/02/17 0837 (!) 104     Resp 05/02/17 0837 20     Temp  05/02/17 0837 97.7 F (36.5 C)     Temp Source 05/02/17 0837 Oral     SpO2 05/02/17 0837 100 %     Weight 05/02/17 0835 185 lb (83.9 kg)     Height 05/02/17 0835 5\' 4"  (1.626 m)     Head Circumference --      Peak Flow --      Pain Score 05/02/17 0835 10     Pain Loc --      Pain Edu? --      Excl. in [place]? --    Constitutional: Alert and oriented. Well appearing and in no acute distress. Eyes: Conjunctivae are normal.  Head: Atraumatic. Nose: no trauma. Neck: No stridor.  o cervical tenderness on palpation posteriorly. Cardiovascular: Normal rate, regular rhythm. Grossly normal heart sounds.  Good peripheral circulation. Respiratory: Normal respiratory effort.  No retractions. Lungs CTAB. Musculoskeletal: examination of the right forearm there is moderate amount of tenderness to light palpation. There is ecchymosis from the proximal fibula to about mid shaft. Soft tissue swelling is present. Patient is able to flex and extend her wrist and hand without any difficulty. Motor sensory function intact. Capillary refill is less than 3 seconds. Neurologic:  Normal speech and language. No gross focal neurologic deficits are appreciated. No gait instability. Skin:  Skin is warm, dry and intact.  Psychiatric: Mood and affect are normal. Speech and behavior are normal.  ____________________________________________   LABS (all labs ordered are listed, but only abnormal results are displayed)  Labs Reviewed - No data to display  RADIOLOGY  Dg Forearm Right  Result Date: 05/02/2017 CLINICAL DATA:  Fall last night. Landed on right arm. Pain constant and can't move arm much. Posterior surface of both joints bruising and pain. EXAM: RIGHT FOREARM - 2 VIEW COMPARISON:  None. FINDINGS: There is no evidence of fracture or other focal bone lesions. Soft tissues are unremarkable. IMPRESSION: No acute osseous injury of the right forearm. Electronically Signed   By: Kathreen Devoid   On: 05/02/2017 09:15     ____________________________________________   PROCEDURES  Procedure(s) performed: None  Procedures  Critical Care performed: No  ____________________________________________   INITIAL IMPRESSION / ASSESSMENT AND PLAN / ED COURSE Patient was made aware that x-rays were negative for fracture. Patient was placed in a sling and encouraged to use ice and elevation for the next several days. Patient was given a prescription for Norco one every 6 hours as needed for pain. Patient is to follow-up with her PCP at [facility] if any continued problems. ____________________________________________   FINAL CLINICAL IMPRESSION(S) / ED DIAGNOSES  Final diagnoses:  Contusion of right forearm, initial encounter  Fall in home, initial encounter  ED Discharge Orders        Ordered    HYDROcodone-acetaminophen (NORCO/VICODIN) 5-325 MG tablet  Every 6 hours PRN     05/02/17 1007       Note:  This document was prepared using Dragon voice recognition software and may include unintentional dictation errors.    Johnn Hai, [name] 05/02/17 1306    Harvest Dark, MD 05/02/17 [phone]

## 2017-05-02 NOTE — Discharge Instructions (Signed)
Follow-up with [facility] clinic acute-care if any continued problems. Ice and elevation for the next 4-5 days. Take Vicodin one every 6 hours as needed for pain.

## 2017-05-02 NOTE — ED Triage Notes (Signed)
Pt tripped and fell on porch yesterday.  Pain/bruising to right forearm.  ambulatory without distress. No obvious deformity. Mild swelling.

## 2017-05-07 ENCOUNTER — Ambulatory Visit (INDEPENDENT_AMBULATORY_CARE_PROVIDER_SITE_OTHER): Payer: Medicaid Other | Admitting: Gastroenterology

## 2017-05-07 ENCOUNTER — Encounter: Payer: Self-pay | Admitting: Gastroenterology

## 2017-05-07 VITALS — BP 120/86 | HR 64 | Temp 98.2°F | Ht 64.0 in | Wt 188.6 lb

## 2017-05-07 DIAGNOSIS — B1920 Unspecified viral hepatitis C without hepatic coma: Secondary | ICD-10-CM | POA: Diagnosis not present

## 2017-05-07 DIAGNOSIS — K5900 Constipation, unspecified: Secondary | ICD-10-CM

## 2017-05-07 DIAGNOSIS — F17200 Nicotine dependence, unspecified, uncomplicated: Secondary | ICD-10-CM | POA: Diagnosis not present

## 2017-05-07 DIAGNOSIS — K219 Gastro-esophageal reflux disease without esophagitis: Secondary | ICD-10-CM | POA: Diagnosis not present

## 2017-05-07 MED ORDER — SUCRALFATE 1 GM/10ML PO SUSP: 1.0000 g | Freq: Four times a day (QID) | ORAL | 1 refills | Status: DC

## 2017-05-07 MED ORDER — OMEPRAZOLE 40 MG PO CPDR: 40.0000 mg | DELAYED_RELEASE_CAPSULE | Freq: Every day | ORAL | 3 refills | Status: DC

## 2017-05-07 MED ORDER — LINACLOTIDE 145 MCG PO CAPS: 145.0000 ug | ORAL_CAPSULE | Freq: Every day | ORAL | Status: AC

## 2017-05-07 NOTE — Progress Notes (Signed)
[name] MD, MRCP(U.K) [address]  [place]  [place], [place] [address]  Main: [phone]  Fax: [phone]   Primary Care Physician: Patient, No Pcp Per  Primary Gastroenterologist:  Dr. [name]   Chief Complaint  Patient presents with  . Nausea  . Abdominal Pain  . Bathroom Frequency    HPI: LADEANA LAPLANT is a 51 y.o. female   Summary of history : Shamina R Dunlapwas initially referred back in 05/2016 for hepatitis C GT 3 .Treatment naive. She said that her step brother had hepatitis C. She says she was a "big drinker" in the past and consumed a 12 pack twice a week for about "all my life".  At her last visit she continued to consume 1 case of alcohol a week . She denies any tatoos., no incarceration or Armed forces logistics/support/administrative officer. She used heroine last July , non since. Last used cocaine last year. No Armed forces logistics/support/administrative officer. Denies use of any herbal medications. Autoimmune screen,HIV, Hep B serology -negative in 08/2016   CT abdomen 03/17/17- increased stool burden and hepatic steatosis.  Colonoscopy 08/2016 - small adenomas excised  RUQ USG 05/07/16 showed hepatic steatosis.   Labs 12/2016- Albumin 4.1,Hb 14.1 and platelet count 213  Interval history 02/2017-05/07/17  No constipation rather says she has diarrhea- takes linzess 290 mcg , would like to cut dose to 145 mcg . Last drink of alcohol >6 months.     Current Outpatient Medications  Medication Sig Dispense Refill  . albuterol (PROVENTIL HFA;VENTOLIN HFA) 108 (90 Base) MCG/ACT inhaler Inhale 2 puffs into the lungs every 6 (six) hours as needed for wheezing or shortness of breath. 1 Inhaler 2  . aspirin EC 81 MG tablet Take 81 mg by mouth daily.    Marland Kitchen atorvastatin (LIPITOR) 20 MG tablet Take 20 mg by mouth at bedtime.    . budesonide-formoterol (SYMBICORT) 160-4.5 MCG/ACT inhaler Inhale 2 puffs into the lungs 2 (two) times daily. 1 Inhaler 0  . busPIRone (BUSPAR) 7.5 MG tablet Take 7.5 mg by mouth 3 (three) times  daily.     Marland Kitchen docusate sodium (COLACE) 100 MG capsule Take 200 mg by mouth 2 (two) times daily as needed for mild constipation.    Marland Kitchen lisinopril (PRINIVIL,ZESTRIL) 20 MG tablet Take 40 mg by mouth daily.     . metoprolol tartrate (LOPRESSOR) 25 MG tablet Take 1 tablet (25 mg total) by mouth 2 (two) times daily. 60 tablet 2  . nitroGLYCERIN (NITROSTAT) 0.4 MG SL tablet Place 1 tablet (0.4 mg total) under the tongue every 5 (five) minutes x 3 doses as needed for chest pain. 30 tablet 2  . potassium chloride (KLOR-CON) 20 MEQ packet Take 20 mEq by mouth 2 (two) times daily.     Marland Kitchen RANEXA 500 MG 12 hr tablet Take 1 tablet (500 mg total) by mouth 2 (two) times daily. 60 tablet 2  . tiZANidine (ZANAFLEX) 4 MG tablet Take 1 tablet (4 mg total) by mouth 3 (three) times daily. 30 tablet 0  . dexlansoprazole (DEXILANT) 60 MG capsule Take 60 mg by mouth daily.    Marland Kitchen dicyclomine (BENTYL) 10 MG capsule Take 1 capsule (10 mg total) by mouth 3 (three) times daily as needed for spasms. 16 capsule 0  . gabapentin (NEURONTIN) 300 MG capsule Take 2-3 capsules (600-900 mg total) by mouth 3 (three) times daily. 270 capsule 2   No current facility-administered medications for this visit.     Allergies as of 05/07/2017 -  Review Complete 05/07/2017  Allergen Reaction Noted  . Levofloxacin Hives and Swelling 02/27/2012  . Flexeril [cyclobenzaprine] Hives, Swelling, and Other (See Comments) 11/13/2013  . Keflex [cephalexin] Hives 10/22/2010  . Ketorolac  10/22/2010  . Phenergan [promethazine hcl] Other (See Comments) 02/21/2017  . Toradol [ketorolac tromethamine] Swelling and Other (See Comments) 01/17/2015  . Tramadol Hives, Swelling, and Other (See Comments) 01/17/2015  . Zoloft [sertraline hcl] Swelling and Other (See Comments) 05/31/2015    ROS:  General: Negative for anorexia, weight loss, fever, chills, fatigue, weakness. ENT: Negative for hoarseness, difficulty swallowing , nasal congestion. CV: Negative for  chest pain, angina, palpitations, dyspnea on exertion, peripheral edema.  Respiratory: Negative for dyspnea at rest, dyspnea on exertion, cough, sputum, wheezing.  GI: See history of present illness. GU:  Negative for dysuria, hematuria, urinary incontinence, urinary frequency, nocturnal urination.  Endo: Negative for unusual weight change.    Physical Examination:   BP 120/86 (BP Location: Left Arm, Patient Position: Sitting, Cuff Size: Large)   Pulse 64   Temp 98.2 F (36.8 C) (Oral)   Ht 5\' 4"  (1.626 m)   Wt 188 lb 9.6 oz (85.5 kg)   BMI 32.37 kg/m   General: Well-nourished, well-developed in no acute distress.  Eyes: No icterus. Conjunctivae pink. Mouth: Oropharyngeal mucosa moist and pink , no lesions erythema or exudate. Lungs: Clear to auscultation bilaterally. Non-labored. Heart: Regular rate and rhythm, no murmurs rubs or gallops.  Abdomen: Bowel sounds are normal, nontender, nondistended, no hepatosplenomegaly or masses, no abdominal bruits or hernia , no rebound or guarding.   Extremities: No lower extremity edema. No clubbing or deformities. Neuro: Alert and oriented x 3.  Grossly intact. Skin: Warm and dry, no jaundice.   Psych: Alert and cooperative, normal mood and affect.   Imaging Studies: Dg Forearm Right  Result Date: 05/02/2017 CLINICAL DATA:  Fall last night. Landed on right arm. Pain constant and can't move arm much. Posterior surface of both joints bruising and pain. EXAM: RIGHT FOREARM - 2 VIEW COMPARISON:  None. FINDINGS: There is no evidence of fracture or other focal bone lesions. Soft tissues are unremarkable. IMPRESSION: No acute osseous injury of the right forearm. Electronically Signed   By: Kathreen Devoid   On: 05/02/2017 09:15    Assessment and Plan:   JASMIA ANGST is a 51 y.o. y/o female here to follow up  for hepatitis C. GT 3 No biochemical or radiological evidence of cirrhosis. Quit all alochol 6 months back. She is ready for treatment  .   Plan  1. Obtain authorization for hepatitis C treatment and start  2. Hep A/B vaccine  3. Decrease dose of linzess 145 mcg   4. Still smoking - advised to stop  5. Commence on PPI and carafate for heartburn   Dr [name]  MD,MRCP [facility]) Follow up in 3 months

## 2017-05-09 ENCOUNTER — Other Ambulatory Visit: Payer: Self-pay

## 2017-05-09 ENCOUNTER — Telehealth: Payer: Self-pay

## 2017-05-09 DIAGNOSIS — B182 Chronic viral hepatitis C: Secondary | ICD-10-CM

## 2017-05-09 NOTE — Telephone Encounter (Signed)
Advised patient of lab order for HCV Fibrosure to start Hep C Treatment.

## 2017-05-09 NOTE — Telephone Encounter (Signed)
-----   Message from Glennie Isle, [place] sent at 05/08/2017  5:05 PM EST ----- Regarding: RE: Hep C Treatment Pt needs a fibrosis score. Her last elastography did not show anything. I need this in order to start. She will need a Fibrosure test.    ----- Message ----- From: Leontine Locket, CMA Sent: 05/08/2017   2:59 PM To: Glennie Isle, CMA Subject: Hep C Treatment                                Please contact this patient concerning starting Hep C Treatment.   Thanks Sara Munoz

## 2017-05-13 ENCOUNTER — Encounter: Payer: Self-pay | Admitting: Emergency Medicine

## 2017-05-13 ENCOUNTER — Emergency Department: Payer: Medicaid Other

## 2017-05-13 ENCOUNTER — Emergency Department
Admission: EM | Admit: 2017-05-13 | Discharge: 2017-05-13 | Disposition: A | Discharge status: Home / Self Care | Payer: Medicaid Other | Attending: Emergency Medicine | Admitting: Emergency Medicine

## 2017-05-13 ENCOUNTER — Other Ambulatory Visit
Admission: RE | Admit: 2017-05-13 | Discharge: 2017-05-13 | Disposition: A | Discharge status: Home / Self Care | Payer: Medicaid Other | Source: Ambulatory Visit | Attending: Gastroenterology | Admitting: Gastroenterology

## 2017-05-13 DIAGNOSIS — Z8673 Personal history of transient ischemic attack (TIA), and cerebral infarction without residual deficits: Secondary | ICD-10-CM | POA: Diagnosis not present

## 2017-05-13 DIAGNOSIS — S161XXA Strain of muscle, fascia and tendon at neck level, initial encounter: Secondary | ICD-10-CM | POA: Diagnosis not present

## 2017-05-13 DIAGNOSIS — Z79899 Other long term (current) drug therapy: Secondary | ICD-10-CM | POA: Diagnosis not present

## 2017-05-13 DIAGNOSIS — W01198A Fall on same level from slipping, tripping and stumbling with subsequent striking against other object, initial encounter: Secondary | ICD-10-CM | POA: Diagnosis not present

## 2017-05-13 DIAGNOSIS — J449 Chronic obstructive pulmonary disease, unspecified: Secondary | ICD-10-CM | POA: Insufficient documentation

## 2017-05-13 DIAGNOSIS — S0003XA Contusion of scalp, initial encounter: Secondary | ICD-10-CM | POA: Insufficient documentation

## 2017-05-13 DIAGNOSIS — J45909 Unspecified asthma, uncomplicated: Secondary | ICD-10-CM | POA: Diagnosis not present

## 2017-05-13 DIAGNOSIS — Y999 Unspecified external cause status: Secondary | ICD-10-CM | POA: Diagnosis not present

## 2017-05-13 DIAGNOSIS — I10 Essential (primary) hypertension: Secondary | ICD-10-CM | POA: Diagnosis not present

## 2017-05-13 DIAGNOSIS — F17210 Nicotine dependence, cigarettes, uncomplicated: Secondary | ICD-10-CM | POA: Insufficient documentation

## 2017-05-13 DIAGNOSIS — Y92002 Bathroom of unspecified non-institutional (private) residence single-family (private) house as the place of occurrence of the external cause: Secondary | ICD-10-CM | POA: Diagnosis not present

## 2017-05-13 DIAGNOSIS — I252 Old myocardial infarction: Secondary | ICD-10-CM | POA: Insufficient documentation

## 2017-05-13 DIAGNOSIS — R55 Syncope and collapse: Secondary | ICD-10-CM | POA: Insufficient documentation

## 2017-05-13 DIAGNOSIS — I2510 Atherosclerotic heart disease of native coronary artery without angina pectoris: Secondary | ICD-10-CM | POA: Insufficient documentation

## 2017-05-13 DIAGNOSIS — Z7982 Long term (current) use of aspirin: Secondary | ICD-10-CM | POA: Insufficient documentation

## 2017-05-13 DIAGNOSIS — B182 Chronic viral hepatitis C: Secondary | ICD-10-CM | POA: Diagnosis present

## 2017-05-13 DIAGNOSIS — Y939 Activity, unspecified: Secondary | ICD-10-CM | POA: Insufficient documentation

## 2017-05-13 LAB — URINALYSIS, COMPLETE (UACMP) WITH MICROSCOPIC
Bilirubin Urine: NEGATIVE
Glucose, UA: NEGATIVE mg/dL
Hgb urine dipstick: NEGATIVE
Ketones, ur: NEGATIVE mg/dL
Leukocytes, UA: NEGATIVE
Nitrite: NEGATIVE
Protein, ur: NEGATIVE mg/dL
Specific Gravity, Urine: 1.005 (ref 1.005–1.030)
pH: 6 (ref 5.0–8.0)

## 2017-05-13 LAB — CBC
HCT: 41.1 % (ref 35.0–47.0)
Hemoglobin: 13.9 g/dL (ref 12.0–16.0)
MCH: 30.2 pg (ref 26.0–34.0)
MCHC: 33.7 g/dL (ref 32.0–36.0)
MCV: 89.7 fL (ref 80.0–100.0)
Platelets: 215 10*3/uL (ref 150–440)
RBC: 4.58 MIL/uL (ref 3.80–5.20)
RDW: 15.1 % — ABNORMAL HIGH (ref 11.5–14.5)
WBC: 9.1 10*3/uL (ref 3.6–11.0)

## 2017-05-13 LAB — BASIC METABOLIC PANEL
Anion gap: 8 (ref 5–15)
BUN: 11 mg/dL (ref 6–20)
CO2: 19 mmol/L — ABNORMAL LOW (ref 22–32)
Calcium: 8.7 mg/dL — ABNORMAL LOW (ref 8.9–10.3)
Chloride: 107 mmol/L (ref 101–111)
Creatinine, Ser: 0.68 mg/dL (ref 0.44–1.00)
GFR calc Af Amer: >60 mL/min (ref >60)
GFR calc non Af Amer: >60 mL/min (ref >60)
Glucose, Bld: 82 mg/dL (ref 65–99)
Potassium: 4.1 mmol/L (ref 3.5–5.1)
Sodium: 134 mmol/L — ABNORMAL LOW (ref 135–145)

## 2017-05-13 LAB — TROPONIN I: Troponin I: <0.03 ng/mL (ref <0.03)

## 2017-05-13 MED ORDER — OXYCODONE-ACETAMINOPHEN 5-325 MG PO TABS
1.0000 | ORAL_TABLET | Freq: Once | ORAL | Status: AC
  Administered 2017-05-13: 1 via ORAL
  Filled 2017-05-13: qty 1

## 2017-05-13 MED ORDER — AMLODIPINE BESYLATE 5 MG PO TABS: 5.0000 mg | ORAL_TABLET | Freq: Every day | ORAL | 0 refills | Status: DC

## 2017-05-13 MED ORDER — MORPHINE SULFATE (PF) 4 MG/ML IV SOLN
4.0000 mg | Freq: Once | INTRAVENOUS | Status: AC
  Administered 2017-05-13: 4 mg via INTRAMUSCULAR
  Filled 2017-05-13: qty 1

## 2017-05-13 MED ORDER — AMLODIPINE BESYLATE 5 MG PO TABS
5.0000 mg | ORAL_TABLET | Freq: Once | ORAL | Status: AC
Start: 2017-05-13 — End: 2017-05-13
  Administered 2017-05-13: 5 mg via ORAL
  Filled 2017-05-13: qty 1

## 2017-05-13 MED ORDER — LISINOPRIL 40 MG PO TABS: 40.0000 mg | ORAL_TABLET | Freq: Every day | ORAL | 0 refills | Status: DC

## 2017-05-13 MED ORDER — IBUPROFEN 600 MG PO TABS
600.0000 mg | ORAL_TABLET | Freq: Once | ORAL | Status: AC
  Administered 2017-05-13: 600 mg via ORAL
  Filled 2017-05-13: qty 1

## 2017-05-13 MED ORDER — TIZANIDINE HCL 4 MG PO TABS
4.0000 mg | ORAL_TABLET | Freq: Three times a day (TID) | ORAL | 0 refills | Status: DC
Start: 2017-05-13 — End: 2017-05-26

## 2017-05-13 MED ORDER — ONDANSETRON 4 MG PO TBDP
4.0000 mg | ORAL_TABLET | Freq: Once | ORAL | Status: AC
  Administered 2017-05-13: 4 mg via ORAL
  Filled 2017-05-13: qty 1

## 2017-05-13 MED ORDER — LISINOPRIL 10 MG PO TABS
40.0000 mg | ORAL_TABLET | Freq: Once | ORAL | Status: AC
  Administered 2017-05-13: 40 mg via ORAL
  Filled 2017-05-13: qty 4

## 2017-05-13 NOTE — ED Provider Notes (Signed)
[facility] Emergency Department Provider Note ____________________________________________   I have reviewed the triage vital signs and the triage nursing note.  HISTORY  Chief Complaint Loss of Consciousness   Historian Patient  HPI Sara Munoz is a 51 y.o. female with a history of coronary artery disease and heart attack as well as COPD and asthma, as well as hypertension, states she has been out of her medications and is due for a new primary care appointment here very shortly with a new doctor, presents today for evaluation after syncope overnight.  Patient states that she has had several episodes of "blacking out" over what sounds like actually a matter of years.  It does not sound like she is ever had a formal evaluation, while she does not indicate that she was worked up by a cardiologist or neurologist.  She had an episode about a month ago.  And then overnight she went to the bathroom and started to feel lightheaded and dizzy and like her vision was closing in and then she woke up on the bathroom floor.  She is not sure how long she may have lost consciousness for.  She felt sore but went back to bed.  This morning she was noticing that she had a significant headache as well as neck pain especially the right side of her neck and so she came in for evaluation both for traumatic evaluation as well as for evaluation from the syncopal episode itself.  Denies chest pain or palpitations either before or after current.  No breathing problems.  Headache is moderate to severe and global.  She has moderate tenderness at the right side of her neck.   Past Medical History:  Diagnosis Date  . Anxiety   . Asthma   . Atypical chest pain 08/02/2015  . CAD (coronary artery disease)   . Chronic back pain   . COPD (chronic obstructive pulmonary disease) ([place])   . Coronary artery disease    a. Mild-non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/2014  . GERD (gastroesophageal reflux  disease)   . Hypercholesteremia   . Hypertension   . Hypokalemia   . Hypomagnesemia 01/04/2014  . Liver disease   . MI (myocardial infarction) ([place])   . Osteoarthritis     Patient Active Problem List   Diagnosis Date Noted  . Neurogenic pain 07/31/2016  . Chronic pain syndrome 06/05/2016  . Carotid artery stenosis 02/27/2016  . Chronic constipation 02/27/2016  . Chronic nausea 02/27/2016  . Coronary artery disease 02/27/2016  . Hypercholesterolemia 02/27/2016  . Osteoarthritis 02/27/2016  . PTSD (post-traumatic stress disorder) 02/27/2016  . TIA (transient ischemic attack) 02/27/2016  . Long term current use of opiate analgesic 02/27/2016  . Long term prescription opiate use 02/27/2016  . Opiate use 02/27/2016  . Encounter for therapeutic drug level monitoring 02/27/2016  . Encounter for pain management consult 02/27/2016  . Chronic hip pain (Location of Tertiary source of pain) (Bilateral) (L>R) 02/27/2016  . Chronic knee pain (Bilateral) (L>R) 02/27/2016  . Chronic shoulder pain (Bilateral) (L>R) 02/27/2016  . Chronic sacroiliac joint pain (Bilateral) (L>R) 02/27/2016  . Chronic low back pain (Location of Primary Source of Pain) (Bilateral) (L>R) 02/27/2016  . Chronic lower extremity pain (Location of Secondary source of pain) (Bilateral) (L>R) 02/27/2016  . Osteoarthritis of hip (Bilateral) (L>R) 02/27/2016  . Chronic neck pain (posterior midline) (Bilateral) (L>R) 02/27/2016  . Cervicogenic headache (Bilateral) (L>R) 02/27/2016  . Occipital headache (Bilateral) (L>R) 02/27/2016  . Chronic upper extremity  pain (Bilateral) (L>R) 02/27/2016  . Chronic Cervical radicular pain (Bilateral) (L>R) 02/27/2016  . Chronic lumbar radicular pain (Right) (L5 dermatome) 02/27/2016  . Lumbar facet syndrome (Bilateral) (L>R) 02/27/2016  . Long term prescription benzodiazepine use 02/27/2016  . Hypertension   . COPD (chronic obstructive pulmonary disease) ([place]) 10/09/2015  . GERD  (gastroesophageal reflux disease) 10/09/2015  . Non-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/2014 08/02/2015  . Hyperlipidemia 08/02/2015  . Costochondritis 08/02/2015  . 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 GAD (generalized anxiety disorder) 09/03/2014  . Narcotic dependence ([place]) 09/03/2014  . PVD (peripheral vascular disease) ([place]) 09/03/2014  . Smoker 09/03/2014  . Cigarette nicotine dependence with nicotine-induced disorder 07/08/2014  . Anemia of chronic disease 03/19/2014  . Narcotic abuse, continuous ([place]) 03/19/2014  . Polysubstance abuse ([place]) 03/19/2014  . MSSA (methicillin susceptible Staphylococcus aureus) septicemia ([place]) 03/07/2014  . Hypomagnesemia 01/04/2014  . Chronic cough 09/04/2011  . Sleep apnea, obstructive 09/04/2011    Past Surgical History:  Procedure Laterality Date  . CARDIAC CATHETERIZATION Left 02/22/2016   Procedure: Left Heart Cath and Coronary Angiography;  Surgeon: Yolonda Kida, MD;  Location: [place] [facility];  Service: Cardiovascular;  Laterality: Left;  . COLONOSCOPY WITH PROPOFOL N/A 09/19/2016   Procedure: COLONOSCOPY WITH PROPOFOL;  Surgeon: [name], MD;  Location: [facility] ENDOSCOPY;  Service: Endoscopy;  Laterality: N/A;  . hemorroids    . ORIF FEMUR FRACTURE    . ORIF TIBIA & FIBULA FRACTURES    . OVARY SURGERY      Prior to Admission medications   Medication Sig Start Date End Date Taking? Authorizing Provider  aspirin EC 81 MG tablet Take 81 mg by mouth daily.   Yes [provider]  atorvastatin (LIPITOR) 20 MG tablet Take 20 mg by mouth at bedtime.   Yes [provider]  budesonide-formoterol (SYMBICORT) 160-4.5 MCG/ACT inhaler Inhale 2 puffs into the lungs 2 (two) times daily. 05/31/15  Yes Gregor Hams, MD  busPIRone (BUSPAR) 7.5 MG tablet Take 7.5 mg by mouth 3 (three) times daily.    Yes [provider]  dexlansoprazole (DEXILANT) 60 MG capsule Take 60 mg by mouth daily.   Yes [provider]   gabapentin (NEURONTIN) 300 MG capsule Take 2-3 capsules (600-900 mg total) by mouth 3 (three) times daily. Patient taking differently: Take 600 mg by mouth 3 (three) times daily.  07/31/16 05/13/17 Yes Milinda Pointer, MD  metoprolol tartrate (LOPRESSOR) 25 MG tablet Take 1 tablet (25 mg total) by mouth 2 (two) times daily. 08/03/15  Yes Demetrios Loll, MD  omeprazole (PRILOSEC) 40 MG capsule Take 1 capsule (40 mg total) daily by mouth. 05/07/17  Yes [name], MD  potassium chloride (KLOR-CON) 20 MEQ packet Take 20 mEq by mouth 2 (two) times daily.    Yes [provider]  RANEXA 500 MG 12 hr tablet Take 1 tablet (500 mg total) by mouth 2 (two) times daily. 08/03/15  Yes Demetrios Loll, MD  albuterol (PROVENTIL HFA;VENTOLIN HFA) 108 (90 Base) MCG/ACT inhaler Inhale 2 puffs into the lungs every 6 (six) hours as needed for wheezing or shortness of breath. 10/10/16   Gregor Hams, MD  amLODipine (NORVASC) 5 MG tablet Take 1 tablet (5 mg total) by mouth daily. 05/13/17 05/13/18  Lisa Roca, MD  dicyclomine (BENTYL) 10 MG capsule Take 1 capsule (10 mg total) by mouth 3 (three) times daily as needed for spasms. Patient not taking: Reported on 05/13/2017 03/17/17 03/31/17  Merlyn Lot,  MD  docusate sodium (COLACE) 100 MG capsule Take 200 mg by mouth 2 (two) times daily as needed for mild constipation.    [provider]  lisinopril (PRINIVIL,ZESTRIL) 40 MG tablet Take 1 tablet (40 mg total) by mouth daily. 05/13/17   Lisa Roca, MD  nitroGLYCERIN (NITROSTAT) 0.4 MG SL tablet Place 1 tablet (0.4 mg total) under the tongue every 5 (five) minutes x 3 doses as needed for chest pain. 08/03/15   Demetrios Loll, MD  sucralfate (CARAFATE) 1 GM/10ML suspension Take 10 mLs (1 g total) 4 (four) times daily by mouth. Patient not taking: Reported on 05/13/2017 05/07/17   [name], MD  tiZANidine (ZANAFLEX) 4 MG tablet Take 1 tablet (4 mg total) by mouth 3 (three) times daily. 05/13/17   Lisa Roca, MD    Allergies  Allergen Reactions  . Levofloxacin Hives and Swelling  . Flexeril [Cyclobenzaprine] Hives, Swelling and Other (See Comments)    Reaction:  Facial/lip swelling  Reaction:  Facial/lip swelling   . Keflex [Cephalexin] Hives  . Ketorolac   . Phenergan [Promethazine Hcl] Other (See Comments)    Agitation.   . Toradol [Ketorolac Tromethamine] Swelling and Other (See Comments)    Reaction:  Facial/tongue swelling  Reaction:  Facial/tongue swelling   . Tramadol Hives, Swelling and Other (See Comments)    Reaction:  Lip swelling  Reaction:  Lip swelling   . Zoloft [Sertraline Hcl] Swelling and Other (See Comments)    Reaction:  Tongue swelling  Reaction:  Tongue swelling     Family History  Problem Relation Age of Onset  . Heart disease Mother   . Lung cancer Mother   . Ovarian cancer Mother     Social History Social History   Tobacco Use  . Smoking status: Current Every Day Smoker    Packs/day: 0.50    Types: Cigarettes  . Smokeless tobacco: Never Used  Substance Use Topics  . Alcohol use: No    Alcohol/week: 0.0 oz  . Drug use: Yes    Types: Marijuana    Review of Systems  Constitutional: Negative for fever. Eyes: Negative for visual changes. ENT: Negative for sore throat. Cardiovascular: Negative for chest pain. Respiratory: Negative for shortness of breath. Gastrointestinal: Negative for abdominal pain, vomiting and diarrhea. Genitourinary: Negative for dysuria. Musculoskeletal: Negative for back pain.  Positive for neck pain as per HPI. Skin: Negative for rash. Neurological: Positive for headache.  ____________________________________________   PHYSICAL EXAM:  VITAL SIGNS: ED Triage Vitals  Enc Vitals Group     BP 05/13/17 1130 (!) 150/99     Pulse Rate 05/13/17 1130 65     Resp 05/13/17 1130 16     Temp 05/13/17 1130 98.1 F (36.7 C)     Temp Source 05/13/17 1130 Oral     SpO2 05/13/17 1130 98 %     Weight 05/13/17 1130  185 lb (83.9 kg)     Height 05/13/17 1130 5\' 4"  (1.626 m)     Head Circumference --      Peak Flow --      Pain Score 05/13/17 1208 (S) 9     Pain Loc --      Pain Edu? --      Excl. in [place]? --      Constitutional: Alert and oriented. Well appearing and in no distress. HEENT   Head: Normocephalic.  She has a small area of erythema at her hairline on the scalp, at the top of  the forehead.      Eyes: Conjunctivae are normal. Pupils equal and round.       Ears:         Nose: No congestion/rhinnorhea.   Mouth/Throat: Mucous membranes are moist.   Neck: No stridor.  No C-spine step-offs, but tenderness both midline but mostly to the right side of the neck where she has tender muscles. Cardiovascular/Chest: Normal rate, regular rhythm.  No murmurs, rubs, or gallops. Respiratory: Normal respiratory effort without tachypnea nor retractions. Breath sounds are clear and equal bilaterally. No wheezes/rales/rhonchi. Gastrointestinal: Soft. No distention, no guarding, no rebound. Nontender.    Genitourinary/rectal:Deferred Musculoskeletal: Nontender with normal range of motion in all extremities. No joint effusions.  No lower extremity tenderness.  No edema. Neurologic:  Normal speech and language. No gross or focal neurologic deficits are appreciated. Skin:  Skin is warm, dry and intact. No rash noted. Psychiatric: Mood and affect are normal. Speech and behavior are normal. Patient exhibits appropriate insight and judgment.   ____________________________________________  LABS (pertinent positives/negatives) I, Lisa Roca, MD the attending physician have reviewed the labs noted below.  Labs Reviewed  BASIC METABOLIC PANEL - Abnormal; Notable for the following components:      Result Value   Sodium 134 (*)    CO2 19 (*)    Calcium 8.7 (*)    All other components within normal limits  CBC - Abnormal; Notable for the following components:   RDW 15.1 (*)    All other components  within normal limits  URINALYSIS, COMPLETE (UACMP) WITH MICROSCOPIC - Abnormal; Notable for the following components:   Color, Urine STRAW (*)    APPearance CLEAR (*)    Bacteria, UA RARE (*)    Squamous Epithelial / LPF 0-5 (*)    All other components within normal limits  TROPONIN I    ____________________________________________    EKG I, Lisa Roca, MD, the attending physician have personally viewed and interpreted all ECGs.  66 bpm.  Normal sinus rhythm.  Narrow QRS.  Nonspecific ST and T wave.  1 and aVL T waves inverted.  Lateral 1 and aVL T wave inversions are new from prior EKG.   ____________________________________________  RADIOLOGY All Xrays were viewed by me.  Imaging interpreted by Radiologist, and I, Lisa Roca, MD the attending physician have reviewed the radiologist interpretation noted below.  Ct head and cervical spine without contrast:  IMPRESSION: 1. No acute intracranial abnormality or calvarial fracture. Unremarkable CT of head. 2. No acute fracture or dislocation of cervical spine. 3. Cervical spondylosis greatest at the C5-6 and C6-7 levels. __________________________________________  PROCEDURES  Procedure(s) performed: None  Critical Care performed: None   ____________________________________________  ED COURSE / ASSESSMENT AND PLAN  Pertinent labs & imaging results that were available during my care of the patient were reviewed by me and considered in my medical decision making (see chart for details).   Ms. Clowers is here for evaluation of head and neck injury after she fell overnight.  It sounds like it was actually a syncope with some presyncopal symptoms of dizziness and vision closing in the past out.  I did go ahead and place a c-collar and have her cervical spine and head CT, and thankfully there is no significant traumatic injuries found there.  I am most suspicious of contusion and strain.   From the standpoint of the fall  which sounds like it probably was a syncope and patient reports other episodes of syncope, on the one hand  it sounds like she has a long history of this, but I am also kind of unclear whether or not she has had a full evaluation for it.  She is not reporting specific cardiac symptoms.  However I am going to recommend that she follow-up closely with cardiology and consider Holter monitoring.  I added on a troponin, she does have leads I and aVL T wave inversion which is a little different from previous.  She is not having any direct cardiac symptoms in terms of chest pain or nausea, etc.  She received some relief from symptomatic medications for the headache and neck pain which is kind of her main concern as to why she came in here.  I discussed with her likely discharge with outpatient follow-up.  I discussed with her I am not prescribe narcotic medications, but would asked that she rely on Tylenol and/or ibuprofen as well as rest and ice/heat over the next several days.  She does report being out of her blood pressure medications and had elevated blood pressure today.  Likely more elevated due to the pain.  I am going to prescribe the lisinopril and amlodipine that she states that she is out of.  I will refer her to Dr. Humphrey Rolls, cardiology -- likely to be seen tomorrow.  Patient is out of her muscle relaxer, and given I think this is potentially a part of her acute pain from the fall, I am going to refill that as well.  Patient care transferred to Dr. Kerman Passey, awaiting troponin.  If negative may be discharged with my prepared discharge instructions with regard to discussed with the patient and she is comfortable with this plan.  CONSULTATIONS:  None   Patient / Family / Caregiver informed of clinical course, medical decision-making process, and agree with plan.   I discussed return precautions, follow-up instructions, and discharge instructions with patient and/or family.  Discharge  Instructions : You are evaluated for head and neck injury after fall last night, and no serious injury is noted.  I recommend treat for bruising and strain with over-the-counter Tylenol and/or ibuprofen, use as directed on labeling.  You may try ice to the sore areas over the next 24 hours, 15 minutes at a time, and after that you may try heat if it is helpful.  You also evaluated after passing out, and although no certain cause was found, your exam and evaluation are overall reassuring in the emergency department today.  I am recommending that you follow-up closely with your primary care physician for elevated blood pressures, and your blood pressure medications have been refilled.  I am recommending close follow-up with cardiology, and Dr. Laurelyn Sickle office number is provided -- call for appointment tomorrow or Friday.  Return to the emergency department immediately for any new or worsening condition including additional passing out episodes, any confusion or altered mental status, weakness, numbness, slurred speech, palpitations, chest pain, nausea or vomiting, or any other symptoms concerning to you.    ___________________________________________   FINAL CLINICAL IMPRESSION(S) / ED DIAGNOSES   Final diagnoses:  Hypertension, essential  Syncope, unspecified syncope type  Contusion of left temporofrontal scalp, initial encounter  Cervical strain, acute, initial encounter      ___________________________________________  ED Discharge Orders        Ordered    lisinopril (PRINIVIL,ZESTRIL) 40 MG tablet  Daily     05/13/17 1517    amLODipine (NORVASC) 5 MG tablet  Daily     05/13/17 1517  tiZANidine (ZANAFLEX) 4 MG tablet  3 times daily     05/13/17 1547            Note: This dictation was prepared with Dragon dictation. Any transcriptional errors that result from this process are unintentional    Lisa Roca, MD 05/13/17 1550

## 2017-05-13 NOTE — ED Notes (Signed)
Pt complains of 9/10 chest pain (tightness and pressure) that has been going on for a couple weeks. Pt thought it was d/t her COPD and her recent increased coughing spells. Pt also complaining of 9/10 constant neck pain that radiates up to head. Pt reports syncopal episode when she woke up to use restroom around 1-2am today. She awoke when it was still dark outside and found herself on the floor of the bathroom.

## 2017-05-13 NOTE — ED Provider Notes (Signed)
-----------------------------------------   4:55 PM on 05/13/2017 -----------------------------------------  Patient's repeat troponin is negative.  Patient has an appointment tomorrow with Dr.Khan for follow-up.  Patient will be discharged home.  She is agreeable to this plan.   Harvest Dark, MD 05/13/17 1655

## 2017-05-13 NOTE — ED Notes (Signed)
Pt transported to CT. Medications to be administered when patient returns from CT.

## 2017-05-13 NOTE — ED Notes (Signed)
Pt ambulatory upon discharge. Verbalized understanding of discharge instructions, prescriptions and follow-up care. A&O x4. Skin warm and dry.

## 2017-05-13 NOTE — Discharge Instructions (Signed)
You are evaluated for head and neck injury after fall last night, and no serious injury is noted.  I recommend treat for bruising and strain with over-the-counter Tylenol and/or ibuprofen, use as directed on labeling.  You may try ice to the sore areas over the next 24 hours, 15 minutes at a time, and after that you may try heat if it is helpful.  You also evaluated after passing out, and although no certain cause was found, your exam and evaluation are overall reassuring in the emergency department today.  I am recommending that you follow-up closely with your primary care physician for elevated blood pressures, and your blood pressure medications have been refilled.  I am recommending close follow-up with cardiology, and Dr. Laurelyn Sickle office number is provided -- call for appointment tomorrow or Friday.  Return to the emergency department immediately for any new or worsening condition including additional passing out episodes, any confusion or altered mental status, weakness, numbness, slurred speech, palpitations, chest pain, nausea or vomiting, or any other symptoms concerning to you.

## 2017-05-13 NOTE — ED Notes (Signed)
Spoke with Dr. Quentin Cornwall in regards to c collar. Verbal order to not place c collar at this time.

## 2017-05-13 NOTE — ED Triage Notes (Signed)
Patient presents to ED via POV from home post syncopal episode. Patient states, "I was trying to pee and the next thing I know I was waking up on the bathroom floor". Patient has contusion under left eye. Patient states she was able to get herself off of the floor by herself. Reports neck pain at this time.

## 2017-05-16 LAB — MISC LABCORP TEST (SEND OUT): Labcorp test code: 550123

## 2017-05-26 ENCOUNTER — Encounter: Payer: Self-pay | Admitting: Physician Assistant

## 2017-05-26 ENCOUNTER — Ambulatory Visit: Payer: Medicaid Other | Admitting: Physician Assistant

## 2017-05-26 VITALS — BP 144/92 | HR 72 | Temp 98.3°F | Resp 16 | Ht 64.0 in | Wt 193.0 lb

## 2017-05-26 DIAGNOSIS — R51 Headache: Secondary | ICD-10-CM | POA: Diagnosis not present

## 2017-05-26 DIAGNOSIS — G8929 Other chronic pain: Secondary | ICD-10-CM

## 2017-05-26 DIAGNOSIS — F419 Anxiety disorder, unspecified: Secondary | ICD-10-CM | POA: Diagnosis not present

## 2017-05-26 DIAGNOSIS — I10 Essential (primary) hypertension: Secondary | ICD-10-CM

## 2017-05-26 DIAGNOSIS — G4486 Cervicogenic headache: Secondary | ICD-10-CM

## 2017-05-26 DIAGNOSIS — Z1329 Encounter for screening for other suspected endocrine disorder: Secondary | ICD-10-CM | POA: Diagnosis not present

## 2017-05-26 DIAGNOSIS — Z1231 Encounter for screening mammogram for malignant neoplasm of breast: Secondary | ICD-10-CM | POA: Diagnosis not present

## 2017-05-26 DIAGNOSIS — Z0000 Encounter for general adult medical examination without abnormal findings: Secondary | ICD-10-CM

## 2017-05-26 DIAGNOSIS — B372 Candidiasis of skin and nail: Secondary | ICD-10-CM | POA: Diagnosis not present

## 2017-05-26 DIAGNOSIS — I2510 Atherosclerotic heart disease of native coronary artery without angina pectoris: Secondary | ICD-10-CM | POA: Diagnosis not present

## 2017-05-26 DIAGNOSIS — Z124 Encounter for screening for malignant neoplasm of cervix: Secondary | ICD-10-CM | POA: Diagnosis not present

## 2017-05-26 DIAGNOSIS — R519 Headache, unspecified: Secondary | ICD-10-CM

## 2017-05-26 DIAGNOSIS — J432 Centrilobular emphysema: Secondary | ICD-10-CM | POA: Diagnosis not present

## 2017-05-26 DIAGNOSIS — Z1239 Encounter for other screening for malignant neoplasm of breast: Secondary | ICD-10-CM

## 2017-05-26 MED ORDER — BUSPIRONE HCL 7.5 MG PO TABS: 7.5000 mg | ORAL_TABLET | Freq: Two times a day (BID) | ORAL | 2 refills | Status: DC

## 2017-05-26 MED ORDER — METOPROLOL TARTRATE 25 MG PO TABS: 25.0000 mg | ORAL_TABLET | Freq: Two times a day (BID) | ORAL | 2 refills | Status: DC

## 2017-05-26 MED ORDER — ALBUTEROL SULFATE HFA 108 (90 BASE) MCG/ACT IN AERS: 2.0000 | INHALATION_SPRAY | Freq: Four times a day (QID) | RESPIRATORY_TRACT | 2 refills | Status: DC | PRN

## 2017-05-26 MED ORDER — NYSTATIN 100000 UNIT/GM EX POWD: Freq: Four times a day (QID) | CUTANEOUS | 0 refills | Status: DC

## 2017-05-26 MED ORDER — TIZANIDINE HCL 4 MG PO TABS: 4.0000 mg | ORAL_TABLET | Freq: Three times a day (TID) | ORAL | 0 refills | Status: DC

## 2017-05-26 MED ORDER — AMLODIPINE BESYLATE 5 MG PO TABS: 5.0000 mg | ORAL_TABLET | Freq: Every day | ORAL | 0 refills | Status: DC

## 2017-05-26 MED ORDER — BUDESONIDE-FORMOTEROL FUMARATE 160-4.5 MCG/ACT IN AERO
2.0000 | INHALATION_SPRAY | Freq: Two times a day (BID) | RESPIRATORY_TRACT | 0 refills | Status: DC
Start: 2017-05-26 — End: 2021-01-17

## 2017-05-26 MED ORDER — LISINOPRIL 40 MG PO TABS: 40.0000 mg | ORAL_TABLET | Freq: Every day | ORAL | 0 refills | Status: DC

## 2017-05-26 MED ORDER — TIOTROPIUM BROMIDE MONOHYDRATE 2.5 MCG/ACT IN AERS: 1.0000 | INHALATION_SPRAY | Freq: Every day | RESPIRATORY_TRACT | 2 refills | Status: DC
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
## 2017-05-26 NOTE — Patient Instructions (Signed)

## 2017-05-28 ENCOUNTER — Encounter: Payer: Self-pay | Admitting: Physician Assistant

## 2017-06-04 ENCOUNTER — Other Ambulatory Visit: Payer: Self-pay

## 2017-06-04 ENCOUNTER — Emergency Department: Payer: Medicaid Other

## 2017-06-04 DIAGNOSIS — I252 Old myocardial infarction: Secondary | ICD-10-CM | POA: Insufficient documentation

## 2017-06-04 DIAGNOSIS — G8929 Other chronic pain: Secondary | ICD-10-CM | POA: Diagnosis not present

## 2017-06-04 DIAGNOSIS — Y939 Activity, unspecified: Secondary | ICD-10-CM | POA: Diagnosis not present

## 2017-06-04 DIAGNOSIS — Z8673 Personal history of transient ischemic attack (TIA), and cerebral infarction without residual deficits: Secondary | ICD-10-CM | POA: Insufficient documentation

## 2017-06-04 DIAGNOSIS — Y999 Unspecified external cause status: Secondary | ICD-10-CM | POA: Diagnosis not present

## 2017-06-04 DIAGNOSIS — S8012XA Contusion of left lower leg, initial encounter: Secondary | ICD-10-CM | POA: Diagnosis not present

## 2017-06-04 DIAGNOSIS — Y92091 Bathroom in other non-institutional residence as the place of occurrence of the external cause: Secondary | ICD-10-CM | POA: Insufficient documentation

## 2017-06-04 DIAGNOSIS — F17210 Nicotine dependence, cigarettes, uncomplicated: Secondary | ICD-10-CM | POA: Diagnosis not present

## 2017-06-04 DIAGNOSIS — J449 Chronic obstructive pulmonary disease, unspecified: Secondary | ICD-10-CM | POA: Diagnosis not present

## 2017-06-04 DIAGNOSIS — I2510 Atherosclerotic heart disease of native coronary artery without angina pectoris: Secondary | ICD-10-CM | POA: Insufficient documentation

## 2017-06-04 DIAGNOSIS — I10 Essential (primary) hypertension: Secondary | ICD-10-CM | POA: Insufficient documentation

## 2017-06-04 DIAGNOSIS — Z79899 Other long term (current) drug therapy: Secondary | ICD-10-CM | POA: Diagnosis not present

## 2017-06-04 DIAGNOSIS — W010XXA Fall on same level from slipping, tripping and stumbling without subsequent striking against object, initial encounter: Secondary | ICD-10-CM | POA: Diagnosis not present

## 2017-06-04 DIAGNOSIS — Z7982 Long term (current) use of aspirin: Secondary | ICD-10-CM | POA: Diagnosis not present

## 2017-06-04 DIAGNOSIS — J45909 Unspecified asthma, uncomplicated: Secondary | ICD-10-CM | POA: Diagnosis not present

## 2017-06-04 DIAGNOSIS — R55 Syncope and collapse: Secondary | ICD-10-CM | POA: Diagnosis not present

## 2017-06-04 LAB — COMPREHENSIVE METABOLIC PANEL
ALT: 62 U/L — ABNORMAL HIGH (ref 14–54)
AST: 104 U/L — ABNORMAL HIGH (ref 15–41)
Albumin: 3.6 g/dL (ref 3.5–5.0)
Alkaline Phosphatase: 75 U/L (ref 38–126)
Anion gap: 11 (ref 5–15)
BUN: 9 mg/dL (ref 6–20)
CO2: 21 mmol/L — ABNORMAL LOW (ref 22–32)
Calcium: 8.8 mg/dL — ABNORMAL LOW (ref 8.9–10.3)
Chloride: 106 mmol/L (ref 101–111)
Creatinine, Ser: 0.61 mg/dL (ref 0.44–1.00)
GFR calc Af Amer: >60 mL/min (ref >60)
GFR calc non Af Amer: >60 mL/min (ref >60)
Glucose, Bld: 95 mg/dL (ref 65–99)
Potassium: 3.7 mmol/L (ref 3.5–5.1)
Sodium: 138 mmol/L (ref 135–145)
Total Bilirubin: 0.5 mg/dL (ref 0.3–1.2)
Total Protein: 7.1 g/dL (ref 6.5–8.1)

## 2017-06-04 LAB — CBC
HCT: 40.3 % (ref 35.0–47.0)
Hemoglobin: 13.6 g/dL (ref 12.0–16.0)
MCH: 29.8 pg (ref 26.0–34.0)
MCHC: 33.7 g/dL (ref 32.0–36.0)
MCV: 88.6 fL (ref 80.0–100.0)
Platelets: 174 10*3/uL (ref 150–440)
RBC: 4.55 MIL/uL (ref 3.80–5.20)
RDW: 14.5 % (ref 11.5–14.5)
WBC: 9.7 10*3/uL (ref 3.6–11.0)

## 2017-06-04 LAB — TROPONIN I: Troponin I: <0.03 ng/mL (ref <0.03)

## 2017-06-04 NOTE — ED Triage Notes (Signed)
Pt states she was in bathroom and woke up on floor, states has been having passing out episodes and is waiting to see neurologist. Pt is having pain to left lower leg pain, bruising noted.

## 2017-06-05 ENCOUNTER — Emergency Department
Admission: EM | Admit: 2017-06-05 | Discharge: 2017-06-05 | Disposition: A | Discharge status: Home / Self Care | Payer: Medicaid Other | Attending: Emergency Medicine | Admitting: Emergency Medicine

## 2017-06-05 DIAGNOSIS — R55 Syncope and collapse: Secondary | ICD-10-CM

## 2017-06-05 DIAGNOSIS — T148XXA Other injury of unspecified body region, initial encounter: Secondary | ICD-10-CM

## 2017-06-05 MED ORDER — OXYCODONE-ACETAMINOPHEN 5-325 MG PO TABS
1.0000 | ORAL_TABLET | Freq: Once | ORAL | Status: AC
  Administered 2017-06-05: 1 via ORAL
  Filled 2017-06-05: qty 1

## 2017-06-05 MED ORDER — IBUPROFEN 600 MG PO TABS
600.0000 mg | ORAL_TABLET | Freq: Once | ORAL | Status: AC
  Administered 2017-06-05: 600 mg via ORAL
  Filled 2017-06-05: qty 1

## 2017-06-05 MED ORDER — OXYCODONE-ACETAMINOPHEN 5-325 MG PO TABS: 1.0000 | ORAL_TABLET | Freq: Four times a day (QID) | ORAL | 0 refills | Status: DC | PRN

## 2017-06-05 MED ORDER — SODIUM CHLORIDE 0.9 % IV BOLUS (SEPSIS)
1000.0000 mL | Freq: Once | INTRAVENOUS | Status: AC
  Administered 2017-06-05: 1000 mL via INTRAVENOUS

## 2017-06-05 MED ORDER — IBUPROFEN 600 MG PO TABS: 600.0000 mg | ORAL_TABLET | Freq: Three times a day (TID) | ORAL | 0 refills | Status: DC | PRN

## 2017-06-05 NOTE — ED Provider Notes (Signed)
[facility] Emergency Department Provider Note  ____________________________________________   First MD Initiated Contact with Patient 06/05/17 [phone]     (approximate)  I have reviewed the triage vital signs and the nursing notes.   HISTORY  Chief Complaint Loss of Consciousness    HPI Sara Munoz is a 51 y.o. female who self presents to the emergency department after syncopal episode today.  She went to the bathroom where she sat down and urinated and after completing she stood up felt lightheaded and the next thing she knew she woke up on the ground with severe left shin pain.  She has had multiple syncopal events recently of unclear etiology.  She was most recently seen in our emergency department 3 weeks ago she had 2- troponins was discharged home with neurology follow-up.  She has been unable to get in to see neurology until February 4.  She had no antecedent chest pain or palpitations.  She did have some nausea and lightheadedness.  She has no family history of sudden cardiac death.  Her symptoms began suddenly and went away quickly.  They seem to be worsened when standing up and somewhat improved when lying down.  She has felt lightheaded throughout the day.  She has a long-standing history of chronic low back pain although has not had any opioids in over 3 weeks.  02/22/16 Cath:  LM lesion, 25 %stenosed.  Prox Cx lesion, 25 %stenosed.  The left ventricular systolic function is normal.  LV end diastolic pressure is normal.  Left dominant system  Mild coronary disease in the proximal ostial circumflex as well as mid left main 25% nonobstructive   Conclusion Left dominant system normal left ventricular function no evidence of significant obstructive coronary disease   Past Medical History:  Diagnosis Date  . Anxiety   . Asthma   . Atypical chest pain 08/02/2015  . CAD (coronary artery disease)   . Chronic back pain   . COPD (chronic  obstructive pulmonary disease) ([place])   . Coronary artery disease    a. Mild-non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/2014  . GERD (gastroesophageal reflux disease)   . Hypercholesteremia   . Hypertension   . Hypokalemia   . Hypomagnesemia 01/04/2014  . Liver disease   . MI (myocardial infarction) ([place])   . Opiate use 02/27/2016  . Osteoarthritis     Patient Active Problem List   Diagnosis Date Noted  . Neurogenic pain 07/31/2016  . Chronic pain syndrome 06/05/2016  . Carotid artery stenosis 02/27/2016  . Chronic constipation 02/27/2016  . Chronic nausea 02/27/2016  . Hypercholesterolemia 02/27/2016  . Osteoarthritis 02/27/2016  . PTSD (post-traumatic stress disorder) 02/27/2016  . TIA (transient ischemic attack) 02/27/2016  . Long term current use of opiate analgesic 02/27/2016  . Long term prescription opiate use 02/27/2016  . Encounter for pain management consult 02/27/2016  . Chronic hip pain (Location of Tertiary source of pain) (Bilateral) (L>R) 02/27/2016  . Chronic knee pain (Bilateral) (L>R) 02/27/2016  . Chronic shoulder pain (Bilateral) (L>R) 02/27/2016  . Chronic sacroiliac joint pain (Bilateral) (L>R) 02/27/2016  . Chronic low back pain (Location of Primary Source of Pain) (Bilateral) (L>R) 02/27/2016  . Chronic lower extremity pain (Location of Secondary source of pain) (Bilateral) (L>R) 02/27/2016  . Osteoarthritis of hip (Bilateral) (L>R) 02/27/2016  . Chronic neck pain (posterior midline) (Bilateral) (L>R) 02/27/2016  . Cervicogenic headache (Bilateral) (L>R) 02/27/2016  . Occipital headache (Bilateral) (L>R) 02/27/2016  . Chronic upper extremity pain (  Bilateral) (L>R) 02/27/2016  . Chronic Cervical radicular pain (Bilateral) (L>R) 02/27/2016  . Chronic lumbar radicular pain (Right) (L5 dermatome) 02/27/2016  . Lumbar facet syndrome (Bilateral) (L>R) 02/27/2016  . Long term prescription benzodiazepine use 02/27/2016  . Hypertension   . COPD (chronic obstructive  pulmonary disease) ([place]) 10/09/2015  . GERD (gastroesophageal reflux disease) 10/09/2015  . Non-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/2014 08/02/2015  . Hyperlipidemia 08/02/2015  . Costochondritis 08/02/2015  . 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 GAD (generalized anxiety disorder) 09/03/2014  . Narcotic dependence ([place]) 09/03/2014  . PVD (peripheral vascular disease) ([place]) 09/03/2014  . Smoker 09/03/2014  . Cigarette nicotine dependence with nicotine-induced disorder 07/08/2014  . Anemia of chronic disease 03/19/2014  . Narcotic abuse, continuous ([place]) 03/19/2014  . Polysubstance abuse ([place]) 03/19/2014  . MSSA (methicillin susceptible Staphylococcus aureus) septicemia ([place]) 03/07/2014  . Hypomagnesemia 01/04/2014  . Chronic cough 09/04/2011  . Sleep apnea, obstructive 09/04/2011    Past Surgical History:  Procedure Laterality Date  . CARDIAC CATHETERIZATION Left 02/22/2016   Procedure: Left Heart Cath and Coronary Angiography;  Surgeon: Yolonda Kida, MD;  Location: [place] [facility];  Service: Cardiovascular;  Laterality: Left;  . COLONOSCOPY WITH PROPOFOL N/A 09/19/2016   Procedure: COLONOSCOPY WITH PROPOFOL;  Surgeon: [name], MD;  Location: [facility] ENDOSCOPY;  Service: Endoscopy;  Laterality: N/A;  . hemorroids    . NASAL SINUS SURGERY    . ORIF FEMUR FRACTURE    . ORIF TIBIA & FIBULA FRACTURES    . OVARY SURGERY      Prior to Admission medications   Medication Sig Start Date End Date Taking? Authorizing Provider  albuterol (PROVENTIL HFA;VENTOLIN HFA) 108 (90 Base) MCG/ACT inhaler Inhale 2 puffs into the lungs every 6 (six) hours as needed for wheezing or shortness of breath. 05/26/17   [name], [name]  amLODipine (NORVASC) 5 MG tablet Take 1 tablet (5 mg total) by mouth daily. 05/26/17 05/26/18  [name], [name]  aspirin EC 81 MG tablet Take 81 mg by mouth daily.    [provider]  atorvastatin (LIPITOR) 20 MG tablet Take 20 mg by mouth at bedtime.     [provider]  budesonide-formoterol (SYMBICORT) 160-4.5 MCG/ACT inhaler Inhale 2 puffs into the lungs 2 (two) times daily. 05/26/17   [name], [name]  busPIRone (BUSPAR) 7.5 MG tablet Take 1 tablet (7.5 mg total) by mouth 2 (two) times daily. 05/26/17 08/24/17  [name], [name]  docusate sodium (COLACE) 100 MG capsule Take 200 mg by mouth 2 (two) times daily as needed for mild constipation.    [provider]  gabapentin (NEURONTIN) 300 MG capsule Take 2-3 capsules (600-900 mg total) by mouth 3 (three) times daily. Patient taking differently: Take 600 mg by mouth 3 (three) times daily.  07/31/16 05/13/17  Milinda Pointer, MD  ibuprofen (ADVIL,MOTRIN) 600 MG tablet Take 1 tablet (600 mg total) by mouth every 8 (eight) hours as needed. 06/05/17   Darel Hong, MD  lisinopril (PRINIVIL,ZESTRIL) 40 MG tablet Take 1 tablet (40 mg total) by mouth daily. 05/26/17   [name], [name]  metoprolol tartrate (LOPRESSOR) 25 MG tablet Take 1 tablet (25 mg total) by mouth 2 (two) times daily. 05/26/17   [name], [name]  nitroGLYCERIN (NITROSTAT) 0.4 MG SL tablet Place 1 tablet (0.4 mg total) under the tongue every 5 (five) minutes x 3 doses as needed for chest pain. 08/03/15   Demetrios Loll, MD  nystatin (MYCOSTATIN/NYSTOP) powder Apply topically 4 (four) times daily. 05/26/17   [name], [name]  omeprazole (PRILOSEC) 40 MG capsule Take 1 capsule (40 mg total) daily by mouth. 05/07/17   [name], MD  oxyCODONE-acetaminophen (ROXICET) 5-325 MG tablet Take 1 tablet by mouth every 6 (six) hours as needed for severe pain. 06/05/17   Darel Hong, MD  RANEXA 500 MG 12 hr tablet Take 1 tablet (500 mg total) by mouth 2 (two) times daily. 08/03/15   Demetrios Loll, MD  sucralfate (CARAFATE) 1 GM/10ML suspension Take 10 mLs (1 g total) 4 (four) times daily by mouth. 05/07/17   [name], MD  Tiotropium Bromide Monohydrate (SPIRIVA RESPIMAT) 2.5 MCG/ACT AERS Inhale 1 puff  into the lungs daily. 05/26/17   [name], [name]  tiZANidine (ZANAFLEX) 4 MG tablet Take 1 tablet (4 mg total) by mouth 3 (three) times daily. 05/26/17   [name], [name]    Allergies Levofloxacin; Flexeril [cyclobenzaprine]; Keflex [cephalexin]; Ketorolac; Phenergan [promethazine hcl]; Toradol [ketorolac tromethamine]; Tramadol; and Zoloft [sertraline hcl]  Family History  Problem Relation Age of Onset  . Heart disease Mother   . Lung cancer Mother   . Ovarian cancer Mother   . Healthy Brother   . Healthy Brother   . Healthy Brother   . Diabetes Maternal Uncle     Social History Social History   Tobacco Use  . Smoking status: Current Every Day Smoker    Packs/day: 0.50    Types: Cigarettes  . Smokeless tobacco: Never Used  Substance Use Topics  . Alcohol use: No    Alcohol/week: 0.0 oz  . Drug use: No    Comment: Pt did smoke Marijuana in the past.  (Pt says she did have one about two weeks ago.)     Review of Systems Constitutional: No fever/chills Eyes: No visual changes. ENT: No sore throat. Cardiovascular: Denies chest pain. Respiratory: Denies shortness of breath. Gastrointestinal: No abdominal pain.  No nausea, no vomiting.  No diarrhea.  No constipation. Genitourinary: Negative for dysuria. Musculoskeletal: Negative for back pain. Skin: Negative for rash. Neurological: Negative for headaches, focal weakness or numbness.   ____________________________________________   PHYSICAL EXAM:  VITAL SIGNS: ED Triage Vitals  Enc Vitals Group     BP 06/04/17 2153 133/90     Pulse Rate 06/04/17 2153 82     Resp 06/04/17 2153 20     Temp 06/04/17 2153 98.9 F (37.2 C)     Temp Source 06/04/17 2153 Oral     SpO2 06/04/17 2153 98 %     Weight 06/04/17 2154 195 lb (88.5 kg)     Height 06/04/17 2154 5\' 4"  (1.626 m)     Head Circumference --      Peak Flow --      Pain Score 06/04/17 2154 10     Pain Loc --      Pain Edu? --      Excl. in [place]? --       Constitutional: Alert and oriented x4 pleasant nontoxic no diaphoresis speaks in full clear sentences Eyes: PERRL EOMI. Head: Atraumatic. Nose: No congestion/rhinnorhea. Mouth/Throat: No trismus no bites to her tongue Neck: No stridor.   Cardiovascular: Normal rate, regular rhythm. Grossly normal heart sounds.  Good peripheral circulation. Respiratory: Normal respiratory effort.  No retractions. Lungs CTAB and moving good air Gastrointestinal: Soft nontender Musculoskeletal: Significant tenderness and hematoma to the left tibia Neurologic:  Normal speech and language. No gross focal  neurologic deficits are appreciated. Skin:  Skin is warm, dry and intact. No rash noted. Psychiatric: Mood and affect are normal. Speech and behavior are normal.    ____________________________________________   DIFFERENTIAL includes but not limited to  Cardiogenic syncope, vasovagal syncope, bone contusion, fracture ____________________________________________   LABS (all labs ordered are listed, but only abnormal results are displayed)  Labs Reviewed  COMPREHENSIVE METABOLIC PANEL - Abnormal; Notable for the following components:      Result Value   CO2 21 (*)    Calcium 8.8 (*)    AST 104 (*)    ALT 62 (*)    All other components within normal limits  CBC  TROPONIN I    Lab work reviewed by me with no acute disease __________________________________________  EKG  ED ECG REPORT I, Darel Hong, the attending physician, personally viewed and interpreted this ECG.  Date: 06/05/2017 EKG Time:  Rate: 74 Rhythm: normal sinus rhythm QRS Axis: normal Intervals: normal ST/T Wave abnormalities: normal Narrative Interpretation: no evidence of acute ischemia  ____________________________________________  RADIOLOGY  Tib-fib x-ray reviewed by me with no acute fracture ____________________________________________   PROCEDURES  Procedure(s) performed:  no  Procedures  Critical Care performed: no  Observation: no ____________________________________________   INITIAL IMPRESSION / ASSESSMENT AND PLAN / ED COURSE  Pertinent labs & imaging results that were available during my care of the patient were reviewed by me and considered in my medical decision making (see chart for details).  The patient's history is most consistent with vasovagal syncope and dehydration.  She was kept on monitor over now with no ectopy.  She does have follow-up with her cardiologist scheduled next week as well as to establish care with neurology within a month.  Regarding her left leg but no obvious fracture x-ray negative she is neurologically intact and feels improved after pain medication.  She is discharged home in improved condition verbalized understanding and agreement the plan.      ____________________________________________   FINAL CLINICAL IMPRESSION(S) / ED DIAGNOSES  Final diagnoses:  Vasovagal syncope  Contusion of bone      NEW MEDICATIONS STARTED DURING THIS VISIT:  This SmartLink is deprecated. Use AVSMEDLIST instead to display the medication list for a patient.   Note:  This document was prepared using Dragon voice recognition software and may include unintentional dictation errors.     Darel Hong, MD 06/05/17 [phone]

## 2017-06-05 NOTE — Discharge Instructions (Signed)
Please keep your appointments with both your cardiologist and your neurologist as scheduled.  Return to the emergency department for any concerns whatsoever.  It was a pleasure to take care of you today, and thank you for coming to our emergency department.  If you have any questions or concerns before leaving please ask the nurse to grab me and I'm more than happy to go through your aftercare instructions again.  If you were prescribed any opioid pain medication today such as Norco, Vicodin, Percocet, morphine, hydrocodone, or oxycodone please make sure you do not drive when you are taking this medication as it can alter your ability to drive safely.  If you have any concerns once you are home that you are not improving or are in fact getting worse before you can make it to your follow-up appointment, please do not hesitate to call 911 and come back for further evaluation.  Darel Hong, MD  Results for orders placed or performed during the hospital encounter of 06/05/17  CBC  Result Value Ref Range   WBC 9.7 3.6 - 11.0 K/uL   RBC 4.55 3.80 - 5.20 MIL/uL   Hemoglobin 13.6 12.0 - 16.0 g/dL   HCT 40.3 35.0 - 47.0 %   MCV 88.6 80.0 - 100.0 fL   MCH 29.8 26.0 - 34.0 pg   MCHC 33.7 32.0 - 36.0 g/dL   RDW 14.5 11.5 - 14.5 %   Platelets 174 150 - 440 K/uL  Comprehensive metabolic panel  Result Value Ref Range   Sodium 138 135 - 145 mmol/L   Potassium 3.7 3.5 - 5.1 mmol/L   Chloride 106 101 - 111 mmol/L   CO2 21 (L) 22 - 32 mmol/L   Glucose, Bld 95 65 - 99 mg/dL   BUN 9 6 - 20 mg/dL   Creatinine, Ser 0.61 0.44 - 1.00 mg/dL   Calcium 8.8 (L) 8.9 - 10.3 mg/dL   Total Protein 7.1 6.5 - 8.1 g/dL   Albumin 3.6 3.5 - 5.0 g/dL   AST 104 (H) 15 - 41 U/L   ALT 62 (H) 14 - 54 U/L   Alkaline Phosphatase 75 38 - 126 U/L   Total Bilirubin 0.5 0.3 - 1.2 mg/dL   GFR calc non Af Amer >60 >60 mL/min   GFR calc Af Amer >60 >60 mL/min   Anion gap 11 5 - 15  Troponin I  Result Value Ref Range   Troponin I <0.03 <0.03 ng/mL   Dg Tibia/fibula Left  Result Date: 06/04/2017 CLINICAL DATA:  Syncopal episode. Left lower leg pain with bruising. EXAM: LEFT TIBIA AND FIBULA - 2 VIEW COMPARISON:  None. FINDINGS: There is no evidence of fracture or other focal bone lesions. The tip of the fibula is excluded on the AP view the appears grossly intact on the lateral. If the patient has pain referable to the ankle, dedicated ankle radiographs are recommended. There is mild pretibial soft tissue swelling proximally. Partially included intramedullary rod in the distal left femoral metadiaphysis. IMPRESSION: Mild pretibial soft tissue swelling along the proximal anterior left leg. No acute osseous appearing abnormality of the left tibia and fibula. Electronically Signed   By: Ashley Royalty M.D.   On: 06/04/2017 22:17   Ct Head Wo Contrast  Result Date: 05/13/2017 CLINICAL DATA:  51 y/o F; syncope with contusion under left eye. Neck pain. EXAM: CT HEAD WITHOUT CONTRAST CT CERVICAL SPINE WITHOUT CONTRAST TECHNIQUE: Multidetector CT imaging of the head and cervical spine was performed  following the standard protocol without intravenous contrast. Multiplanar CT image reconstructions of the cervical spine were also generated. COMPARISON:  None. FINDINGS: CT HEAD FINDINGS Brain: No evidence of acute infarction, hemorrhage, hydrocephalus, extra-axial collection or mass lesion/mass effect. Vascular: No hyperdense vessel or unexpected calcification. Skull: Normal. Negative for fracture or focal lesion. Sinuses/Orbits: No acute finding. Other: None. CT CERVICAL SPINE FINDINGS Alignment: Straightening of cervical lordosis.  No listhesis. Skull base and vertebrae: No acute fracture. No primary bone lesion or focal pathologic process. Soft tissues and spinal canal: No prevertebral fluid or swelling. No visible canal hematoma. Disc levels: Mild cervical spondylosis with discogenic degenerative changes greatest at the C5-C7  levels. Uncovertebral hypertrophy encroaches on the neural foramen at the C6-7 level. Multifactorial mild-to-moderate C6-7 canal stenosis. Upper chest: Negative. Other: Negative. IMPRESSION: 1. No acute intracranial abnormality or calvarial fracture. Unremarkable CT of head. 2. No acute fracture or dislocation of cervical spine. 3. Cervical spondylosis greatest at the C5-6 and C6-7 levels. Electronically Signed   By: Kristine Garbe M.D.   On: 05/13/2017 14:39   Ct Cervical Spine Wo Contrast  Result Date: 05/13/2017 CLINICAL DATA:  51 y/o F; syncope with contusion under left eye. Neck pain. EXAM: CT HEAD WITHOUT CONTRAST CT CERVICAL SPINE WITHOUT CONTRAST TECHNIQUE: Multidetector CT imaging of the head and cervical spine was performed following the standard protocol without intravenous contrast. Multiplanar CT image reconstructions of the cervical spine were also generated. COMPARISON:  None. FINDINGS: CT HEAD FINDINGS Brain: No evidence of acute infarction, hemorrhage, hydrocephalus, extra-axial collection or mass lesion/mass effect. Vascular: No hyperdense vessel or unexpected calcification. Skull: Normal. Negative for fracture or focal lesion. Sinuses/Orbits: No acute finding. Other: None. CT CERVICAL SPINE FINDINGS Alignment: Straightening of cervical lordosis.  No listhesis. Skull base and vertebrae: No acute fracture. No primary bone lesion or focal pathologic process. Soft tissues and spinal canal: No prevertebral fluid or swelling. No visible canal hematoma. Disc levels: Mild cervical spondylosis with discogenic degenerative changes greatest at the C5-C7 levels. Uncovertebral hypertrophy encroaches on the neural foramen at the C6-7 level. Multifactorial mild-to-moderate C6-7 canal stenosis. Upper chest: Negative. Other: Negative. IMPRESSION: 1. No acute intracranial abnormality or calvarial fracture. Unremarkable CT of head. 2. No acute fracture or dislocation of cervical spine. 3. Cervical  spondylosis greatest at the C5-6 and C6-7 levels. Electronically Signed   By: Kristine Garbe M.D.   On: 05/13/2017 14:39

## 2017-06-06 ENCOUNTER — Telehealth: Payer: Self-pay | Admitting: Physician Assistant

## 2017-06-06 ENCOUNTER — Telehealth: Payer: Self-pay | Admitting: Gastroenterology

## 2017-06-06 NOTE — Telephone Encounter (Signed)
Pt called to check on referral to pain clinic for pain management. Pt is requesting to go to a pain clinic in [place]. Please advise. Thanks TNP

## 2017-06-06 NOTE — Telephone Encounter (Signed)
Patient is calling about her liver prescription. When does she pick it up?

## 2017-06-06 NOTE — Telephone Encounter (Signed)
Ginger sent message to speciality pharmacy to follow-up on medication delivery.   Advised patient.

## 2017-06-06 NOTE — Telephone Encounter (Signed)
Do you know anything about this? Thanks.

## 2017-06-10 NOTE — Telephone Encounter (Signed)
Referral was made to [facility].Their office will contact pt after records are reviewed.Pt advised

## 2017-06-11 NOTE — Telephone Encounter (Signed)
Sara Munoz with [facility] to advise pt is scheduled for appt on 06/18/17 @ 1 pm. Thanks TNP

## 2017-06-11 NOTE — Telephone Encounter (Signed)
Spoke to pt regarding the benefits readiness form she will need to sign per her insurance. Pt will come to the [place] office to sign either later today or tomorrow.

## 2017-06-16 ENCOUNTER — Telehealth: Payer: Self-pay | Admitting: Gastroenterology

## 2017-06-16 NOTE — Telephone Encounter (Signed)
Sara Munoz called from [facility]. She is holding a rx for the patient due to a drug interaction. Please call. Danae Chen [phone]

## 2017-06-18 NOTE — Telephone Encounter (Signed)
See below. Just fyi.Marland KitchenMarland Kitchen

## 2017-06-18 NOTE — Telephone Encounter (Signed)
Contacted Sara Munoz at [facility] and advised her we will have the pt to hold the atorvastatin while on treatment. A message has been sent to the pt's PCP advising her of the treatment plan. Pt has been advised of this information as well.

## 2017-06-20 ENCOUNTER — Other Ambulatory Visit: Payer: Self-pay | Admitting: Internal Medicine

## 2017-06-20 ENCOUNTER — Telehealth: Payer: Self-pay | Admitting: Emergency Medicine

## 2017-06-20 DIAGNOSIS — I208 Other forms of angina pectoris: Secondary | ICD-10-CM

## 2017-06-20 NOTE — Telephone Encounter (Signed)
-----   Message from [name], [place] sent at 06/19/2017  2:15 PM EST ----- If patient is having pain in her breast and feels a lump she needs to come in for a visit and be re-evaluated. I ordered a screening mammogram and I will need to document findings to order these imaging studies.  ----- Message ----- From: Kathaleen Bury, NT Sent: 06/19/2017   2:00 PM To: [name], [name]  Patient called and stated she is having pain in her breast and feels a lump. Patient will need a Diagnostic Bilateral Mammogram(IMG 5535) with Left Breast Ultrasound(IMG 5531) and Right Breast Ultrasound(IMG 5532).   Thanks

## 2017-06-20 NOTE — Telephone Encounter (Signed)
Pt has an appt for Jan 3rd discuss this.

## 2017-06-20 NOTE — Telephone Encounter (Signed)
LMTCB

## 2017-06-26 ENCOUNTER — Ambulatory Visit (INDEPENDENT_AMBULATORY_CARE_PROVIDER_SITE_OTHER): Payer: Medicaid Other | Admitting: Physician Assistant

## 2017-06-26 ENCOUNTER — Telehealth: Payer: Self-pay | Admitting: Physician Assistant

## 2017-06-26 ENCOUNTER — Encounter: Payer: Self-pay | Admitting: Physician Assistant

## 2017-06-26 VITALS — BP 142/88 | HR 60 | Temp 98.4°F | Resp 16 | Wt 192.0 lb

## 2017-06-26 DIAGNOSIS — G8929 Other chronic pain: Secondary | ICD-10-CM

## 2017-06-26 DIAGNOSIS — N63 Unspecified lump in unspecified breast: Secondary | ICD-10-CM | POA: Diagnosis not present

## 2017-06-26 DIAGNOSIS — Z23 Encounter for immunization: Secondary | ICD-10-CM

## 2017-06-26 DIAGNOSIS — G4486 Cervicogenic headache: Secondary | ICD-10-CM

## 2017-06-26 DIAGNOSIS — F419 Anxiety disorder, unspecified: Secondary | ICD-10-CM

## 2017-06-26 DIAGNOSIS — N6324 Unspecified lump in the left breast, lower inner quadrant: Secondary | ICD-10-CM | POA: Diagnosis not present

## 2017-06-26 DIAGNOSIS — F32A Depression, unspecified: Secondary | ICD-10-CM

## 2017-06-26 DIAGNOSIS — R51 Headache: Secondary | ICD-10-CM

## 2017-06-26 DIAGNOSIS — K732 Chronic active hepatitis, not elsewhere classified: Secondary | ICD-10-CM | POA: Diagnosis not present

## 2017-06-26 DIAGNOSIS — M5412 Radiculopathy, cervical region: Secondary | ICD-10-CM

## 2017-06-26 DIAGNOSIS — F329 Major depressive disorder, single episode, unspecified: Secondary | ICD-10-CM | POA: Diagnosis not present

## 2017-06-26 MED ORDER — TIZANIDINE HCL 2 MG PO CAPS: ORAL_CAPSULE | ORAL | 0 refills | Status: AC

## 2017-06-26 MED ORDER — GABAPENTIN 600 MG PO TABS: 600.0000 mg | ORAL_TABLET | Freq: Three times a day (TID) | ORAL | 2 refills | Status: DC

## 2017-06-26 MED ORDER — BUSPIRONE HCL 15 MG PO TABS: 15.0000 mg | ORAL_TABLET | Freq: Two times a day (BID) | ORAL | 2 refills | Status: AC

## 2017-06-26 NOTE — Patient Instructions (Signed)
Hepatitis C Hepatitis C is a viral infection of the liver. It can lead to scarring of the liver (cirrhosis), liver failure, or liver cancer. Hepatitis C may go undetected for months or years because people with the infection may not have symptoms, or they may have only mild symptoms. What are the causes? This condition is caused by the hepatitis C virus (HCV). The virus can spread from person to person (is contagious) through:  Blood.  Childbirth. A woman who has hepatitis C can pass it to her baby during birth.  Bodily fluids, such as breast milk, tears, semen, vaginal fluids, and saliva.  Blood transfusions or organ transplants done in the [place] before 1992.  What increases the risk? The following factors may make you more likely to develop this condition:  Having contact with unclean (contaminated) needles or syringes. This may result from: ? Acupuncture. ? Tattoing. ? Body piercing. ? Injecting drugs.  Having unprotected sex with someone who is infected.  Needing treatment to filter your blood (kidney dialysis).  Having HIV (human immunodeficiency virus) or AIDS (acquired immunodeficiency syndrome).  Working in a job that involves contact with blood or bodily fluids, such as health care.  What are the signs or symptoms? Symptoms of this condition include:  Fatigue.  Loss of appetite.  Nausea.  Vomiting.  Abdominal pain.  Dark yellow urine.  Yellowish skin and eyes (jaundice).  Itchy skin.  Clay-colored bowel movements.  Joint pain.  Bleeding and bruising easily.  Fluid building up in your stomach (ascites).  In some cases, you may not have any symptoms. How is this diagnosed? This condition is diagnosed with:  Blood tests.  Other tests to check how well your liver is functioning. They may include: ? Magnetic resonance elastography (MRE). This imaging test uses MRIs and sound waves to measure liver stiffness. ? Transient elastography. This  imaging test uses ultrasounds to measure liver stiffness. ? Liver biopsy. This test requires taking a small tissue sample from your liver to examine it under a microscope.  How is this treated? Your health care provider may perform noninvasive tests or a liver biopsy to help decide the best course of treatment. Treatment may include:  Antiviral medicines and other medicines.  Follow-up treatments every 6-12 months for infections or other liver conditions.  Receiving a donated liver (liver transplant).  Follow these instructions at home: Medicines  Take over-the-counter and prescription medicines only as told by your health care provider.  Take your antiviral medicine as told by your health care provider. Do not stop taking the antiviral even if you start to feel better.  Do not take any medicines unless approved by your health care provider, including over-the-counter medicines and birth control pills. Activity  Rest as needed.  Do not have sex unless approved by your health care provider.  Ask your health care provider when you may return to school or work. Eating and drinking  Eat a balanced diet with plenty of fruits and vegetables, whole grains, and lowfat (lean) meats or non-meat proteins (such as beans or tofu).  Drink enough fluids to keep your urine clear or pale yellow.  Do not drink alcohol. General instructions  Do not share toothbrushes, nail clippers, or razors.  Wash your hands frequently with soap and water. If soap and water are not available, use hand sanitizer.  Cover any cuts or open sores on your skin to prevent spreading the virus.  Keep all follow-up visits as told by your health care   provider. This is important. You may need follow-up visits every 6-12 months. How is this prevented? There is no vaccine for hepatitis C. The only way to prevent the disease is to reduce the risk of exposure to the virus. Make sure you:  Wash your hands frequently with  soap and water. If soap and water are not available, use hand sanitizer.  Do not share needles or syringes.  Practice safe sex and use condoms.  Avoid handling blood or bodily fluids without gloves or other protection.  Avoid getting tattoos or piercings in shops or other locations that are not clean.  Contact a health care provider if:  You have a fever.  You develop abdominal pain.  You pass dark urine.  You pass clay-colored stools.  You develop joint pain. Get help right away if:  You have increasing fatigue or weakness.  You lose your appetite.  You cannot eat or drink without vomiting.  You develop jaundice or your jaundice gets worse.  You bruise or bleed easily. Summary  Hepatitis C is a viral infection of the liver. It can lead to scarring of the liver (cirrhosis), liver failure, or liver cancer.  The hepatitis C virus (HCV) causes this condition. The virus can pass from person to person (is contagious).  You should not take any medicines unless approved by your health care provider. This includes over-the-counter medicines and birth control pills. This information is not intended to replace advice given to you by your health care provider. Make sure you discuss any questions you have with your health care provider. Document Released: 06/07/2000 Document Revised: 07/16/2016 Document Reviewed: 07/16/2016 Elsevier Interactive Patient Education  2018 [employer]  

## 2017-06-26 NOTE — Progress Notes (Signed)
Patient: Sara Munoz Female    DOB: [birth year]   52 y.o.   MRN: [id number] Visit Date: 06/26/2017  Today's Provider: [name], [name]   Chief Complaint  Patient presents with  . Hypertension  . Anxiety   Subjective:    Sara Munoz is a 52 y/o woman with history of hepatitis C infection following up today for multiple issues.   The first of which is regarding her hepatitis C treatment. She needs her hepatitis A and B vaccinations today. She reports that she still has not started her treatment, it is pending. She will be staring  Mavyret orally. Due to drug interactions, her Lipitor will be held.  She reports she has been contacted by [facility] in [place], [place]. She says she has an appointment 07/02/2017 which she may have to reschedule if her Medicaid transport doesn't get approved.  She was also scheduled for a screening mammogram but reported she has a breast lump and in her left breast x several months. Denies fever, chills, IVDU, nipple discharge. When asks if her lump is present today, she says she doesn't know.  In the interim, she has been to see cardiology. Currently wearing a loop event monitor. Her BP medications have been maintained. She is scheduled for an upcoming Echo and Myoview.  Also reports increased anxiety. She says she has been taking Buspar 7.5mg  TID with some help for anxiety but she still has panic attacks. When asked what her stressors are, she says "What aren't my stressors." Then cites the recent death of a nephew. She reports prior visits to psychiatrist for 12 years and would like to establish with a new one.  Also requesting refills on gabapentin and Zanaflex for neuropathy/radiculopathy and cervicogenic headache until she can see pain management.   She is due for a PAP, declining it today.   Hypertension  This is a chronic problem. The problem is controlled. Associated symptoms include anxiety, chest pain, headaches (Pt reports  she "Keeps a headache"  ), palpitations and shortness of breath. Pertinent negatives include no blurred vision, malaise/fatigue, neck pain, orthopnea, peripheral edema, PND or sweats.  Anxiety  Presents for follow-up visit. Symptoms include chest pain, decreased concentration, excessive worry, insomnia, nausea (Takes Zofran almost everyday.), nervous/anxious behavior, palpitations, panic and shortness of breath. Patient reports no confusion, depressed mood, dizziness or suicidal ideas. Symptoms occur constantly. The quality of sleep is poor. Nighttime awakenings: several.         Allergies  Allergen Reactions  . Levofloxacin Hives and Swelling  . Flexeril [Cyclobenzaprine] Hives, Swelling and Other (See Comments)    Reaction:  Facial/lip swelling  Reaction:  Facial/lip swelling   . Keflex [Cephalexin] Hives  . Ketorolac   . Phenergan [Promethazine Hcl] Other (See Comments)    Agitation.   . Toradol [Ketorolac Tromethamine] Swelling and Other (See Comments)    Reaction:  Facial/tongue swelling  Reaction:  Facial/tongue swelling   . Tramadol Hives, Swelling and Other (See Comments)    Reaction:  Lip swelling  Reaction:  Lip swelling   . Zoloft [Sertraline Hcl] Swelling and Other (See Comments)    Reaction:  Tongue swelling  Reaction:  Tongue swelling      Current Outpatient Medications:  .  albuterol (PROVENTIL HFA;VENTOLIN HFA) 108 (90 Base) MCG/ACT inhaler, Inhale 2 puffs into the lungs every 6 (six) hours as needed for wheezing or shortness of breath., Disp: 1 Inhaler, Rfl: 2 .  amLODipine (  NORVASC) 5 MG tablet, Take 1 tablet (5 mg total) by mouth daily., Disp: 30 tablet, Rfl: 0 .  aspirin EC 81 MG tablet, Take 81 mg by mouth daily., Disp: , Rfl:  .  atorvastatin (LIPITOR) 20 MG tablet, Take 20 mg by mouth at bedtime., Disp: , Rfl:  .  budesonide-formoterol (SYMBICORT) 160-4.5 MCG/ACT inhaler, Inhale 2 puffs into the lungs 2 (two) times daily., Disp: 1 Inhaler, Rfl: 0 .   busPIRone (BUSPAR) 7.5 MG tablet, Take 1 tablet (7.5 mg total) by mouth 2 (two) times daily., Disp: 60 tablet, Rfl: 2 .  docusate sodium (COLACE) 100 MG capsule, Take 200 mg by mouth 2 (two) times daily as needed for mild constipation., Disp: , Rfl:  .  gabapentin (NEURONTIN) 300 MG capsule, Take 2-3 capsules (600-900 mg total) by mouth 3 (three) times daily. (Patient taking differently: Take 600 mg by mouth 3 (three) times daily. ), Disp: 270 capsule, Rfl: 2 .  ibuprofen (ADVIL,MOTRIN) 600 MG tablet, Take 1 tablet (600 mg total) by mouth every 8 (eight) hours as needed., Disp: 30 tablet, Rfl: 0 .  lisinopril (PRINIVIL,ZESTRIL) 40 MG tablet, Take 1 tablet (40 mg total) by mouth daily., Disp: 30 tablet, Rfl: 0 .  metoprolol tartrate (LOPRESSOR) 25 MG tablet, Take 1 tablet (25 mg total) by mouth 2 (two) times daily., Disp: 60 tablet, Rfl: 2 .  nitroGLYCERIN (NITROSTAT) 0.4 MG SL tablet, Place 1 tablet (0.4 mg total) under the tongue every 5 (five) minutes x 3 doses as needed for chest pain., Disp: 30 tablet, Rfl: 2 .  nystatin (MYCOSTATIN/NYSTOP) powder, Apply topically 4 (four) times daily., Disp: 15 g, Rfl: 0 .  omeprazole (PRILOSEC) 40 MG capsule, Take 1 capsule (40 mg total) daily by mouth., Disp: 90 capsule, Rfl: 3 .  ondansetron (ZOFRAN-ODT) 8 MG disintegrating tablet, Take 8 mg by mouth once., Disp: , Rfl:  .  oxyCODONE-acetaminophen (ROXICET) 5-325 MG tablet, Take 1 tablet by mouth every 6 (six) hours as needed for severe pain., Disp: 7 tablet, Rfl: 0 .  RANEXA 500 MG 12 hr tablet, Take 1 tablet (500 mg total) by mouth 2 (two) times daily., Disp: 60 tablet, Rfl: 2 .  Tiotropium Bromide Monohydrate (SPIRIVA RESPIMAT) 2.5 MCG/ACT AERS, Inhale 1 puff into the lungs daily., Disp: 4 g, Rfl: 2 .  tiZANidine (ZANAFLEX) 4 MG tablet, Take 1 tablet (4 mg total) by mouth 3 (three) times daily., Disp: 30 tablet, Rfl: 0 .  sucralfate (CARAFATE) 1 GM/10ML suspension, Take 10 mLs (1 g total) 4 (four) times  daily by mouth., Disp: 420 mL, Rfl: 1  Current Facility-Administered Medications:  .  linaclotide (LINZESS) capsule 145 mcg, 145 mcg, Oral, QAC breakfast, [name], MD  Review of Systems  Constitutional: Positive for diaphoresis and fatigue. Negative for activity change, appetite change, chills, fever, malaise/fatigue and unexpected weight change.  Eyes: Negative for blurred vision.  Respiratory: Positive for cough, shortness of breath and wheezing. Negative for apnea, choking, chest tightness and stridor.   Cardiovascular: Positive for chest pain, palpitations and leg swelling. Negative for orthopnea and PND.       Pt is being followed by Cardiology.  Currently on a heart monitor for 30 days.    Gastrointestinal: Positive for diarrhea and nausea (Takes Zofran almost everyday.). Negative for abdominal distention, abdominal pain, anal bleeding, blood in stool, constipation, rectal pain and vomiting.  Musculoskeletal: Negative for neck pain.  Neurological: Positive for syncope (Pt says she has "Black out spells."  She  has an appointment with Neurology 07/28/2017.) and headaches (Pt reports she "Keeps a headache"  ). Negative for dizziness and light-headedness.  Psychiatric/Behavioral: Positive for decreased concentration and sleep disturbance. Negative for agitation, behavioral problems, confusion, dysphoric mood, hallucinations, self-injury and suicidal ideas. The patient is nervous/anxious and has insomnia. The patient is not hyperactive.     Social History   Tobacco Use  . Smoking status: Current Every Day Smoker    Packs/day: 0.50    Types: Cigarettes  . Smokeless tobacco: Never Used  Substance Use Topics  . Alcohol use: No    Alcohol/week: 0.0 oz   Objective:   BP (!) 142/88 (BP Location: Right Arm, Patient Position: Sitting, Cuff Size: Normal)   Pulse 60   Temp 98.4 F (36.9 C) (Oral)   Resp 16   Wt 192 lb (87.1 kg)   LMP 09/18/2016   BMI 32.96 kg/m  Vitals:   06/26/17  0957  BP: (!) 142/88  Pulse: 60  Resp: 16  Temp: 98.4 F (36.9 C)  TempSrc: Oral  Weight: 192 lb (87.1 kg)     Physical Exam  Constitutional: She is oriented to person, place, and time. She appears well-developed and well-nourished.  Cardiovascular: Normal rate.  Pulmonary/Chest: Effort normal. Right breast exhibits no inverted nipple, no mass, no nipple discharge, no skin change and no tenderness. Left breast exhibits mass. Left breast exhibits no inverted nipple, no nipple discharge, no skin change and no tenderness. Breasts are symmetrical.  There is perhaps a small mobile mass in the 5 o'clock region of her left breast, though her breasts have dense tissue and it is difficult to palpate.   Neurological: She is alert and oriented to person, place, and time.  Skin: Skin is warm and dry.  Psychiatric: She has a normal mood and affect. Her behavior is normal.        Assessment & Plan:     1. Anxiety and depression  She can increase Buspar to 15 mg BID until seen by psychiatry.   - Ambulatory referral to Psychiatry - busPIRone (BUSPAR) 15 MG tablet; Take 1 tablet (15 mg total) by mouth 2 (two) times daily.  Dispense: 60 tablet; Refill: 2  2. Chronic active hepatitis ([facility] [place])  1st series of each started today. 2nd series of Hep B in one month, and final series 6 months from now.  - Hepatitis B vaccine adult IM - Hepatitis A vaccine adult IM  3. Breast lump  - MM Digital Diagnostic Bilat; Future - US BREAST LTD UNI LEFT INC AXILLA; Future  4. Chronic Cervical radicular pain (Bilateral) (L>R)  - gabapentin (NEURONTIN) 600 MG tablet; Take 1 tablet (600 mg total) by mouth 3 (three) times daily.  Dispense: 90 tablet; Refill: 2  5. Cervicogenic headache (Bilateral) (L>R)  - tizanidine (ZANAFLEX) 2 MG capsule; Take 1 capsule (2 mg total) by mouth daily as needed for 7 days for muscle spasms, THEN 1 capsule (2 mg total) 2 (two) times daily as needed for 7 days for muscle spasms,  THEN 1 capsule (2 mg total) 3 (three) times daily as needed for up to 14 days for muscle spasms.  Dispense: 90 capsule; Refill: 0   I have spent 25 minutes with this patient, >50% of which was spent on counseling and coordination of care.       [name], [name]  [facility]

## 2017-06-26 NOTE — Telephone Encounter (Signed)
Per [facility].They will need an order for right breast limited ultrasound and all diagnostic mammograms must be TOMO

## 2017-06-27 ENCOUNTER — Other Ambulatory Visit: Payer: Self-pay | Admitting: Physician Assistant

## 2017-06-27 DIAGNOSIS — N6323 Unspecified lump in the left breast, lower outer quadrant: Secondary | ICD-10-CM

## 2017-06-27 NOTE — Telephone Encounter (Signed)
Ordered, thank you.

## 2017-06-28 ENCOUNTER — Encounter (INDEPENDENT_AMBULATORY_CARE_PROVIDER_SITE_OTHER): Payer: Self-pay

## 2017-06-30 ENCOUNTER — Encounter: Admission: RE | Admit: 2017-06-30 | Payer: Medicaid Other | Source: Ambulatory Visit

## 2017-07-04 ENCOUNTER — Encounter: Payer: Self-pay | Admitting: Radiology

## 2017-07-04 ENCOUNTER — Telehealth: Payer: Self-pay

## 2017-07-04 ENCOUNTER — Ambulatory Visit
Admission: RE | Admit: 2017-07-04 | Discharge: 2017-07-04 | Disposition: A | Discharge status: Home / Self Care | Payer: Medicaid Other | Source: Ambulatory Visit | Attending: Physician Assistant | Admitting: Physician Assistant

## 2017-07-04 DIAGNOSIS — N6323 Unspecified lump in the left breast, lower outer quadrant: Secondary | ICD-10-CM | POA: Diagnosis present

## 2017-07-04 NOTE — Telephone Encounter (Signed)
Patient advised as below. Patient request that we try to get appointment scheduled if insurance will pay. sd

## 2017-07-04 NOTE — Telephone Encounter (Signed)
-----   Message from [name], [place] sent at 07/04/2017  2:52 PM EST ----- Normal appearing fibroglandular tissue in area of concern on mammogram. Recommendation is to schedule ultrasound, though I reviewed the form from medicaid to get ultrasound and it required a mammographic finding of abnormality to be approved. I can try to get this through if patient wants to pursue ultrasound, though I am not sure if insurance will approve it.

## 2017-07-09 ENCOUNTER — Other Ambulatory Visit: Payer: Self-pay | Admitting: Physician Assistant

## 2017-07-09 DIAGNOSIS — N6320 Unspecified lump in the left breast, unspecified quadrant: Secondary | ICD-10-CM

## 2017-07-10 ENCOUNTER — Other Ambulatory Visit
Admission: RE | Admit: 2017-07-10 | Discharge: 2017-07-10 | Disposition: A | Discharge status: Home / Self Care | Payer: Medicaid Other | Source: Ambulatory Visit | Attending: Internal Medicine | Admitting: Internal Medicine

## 2017-07-10 ENCOUNTER — Encounter
Admission: RE | Admit: 2017-07-10 | Discharge: 2017-07-10 | Disposition: A | Discharge status: Home / Self Care | Payer: Medicaid Other | Source: Ambulatory Visit | Attending: Internal Medicine | Admitting: Internal Medicine

## 2017-07-10 DIAGNOSIS — I208 Other forms of angina pectoris: Secondary | ICD-10-CM | POA: Diagnosis not present

## 2017-07-10 LAB — PREGNANCY, URINE: Preg Test, Ur: NEGATIVE

## 2017-07-10 MED ORDER — TECHNETIUM TC 99M TETROFOSMIN IV KIT
13.0200 | PACK | Freq: Once | INTRAVENOUS | Status: AC | PRN
  Administered 2017-07-10: 13.02 via INTRAVENOUS

## 2017-07-10 MED ORDER — REGADENOSON 0.4 MG/5ML IV SOLN
0.4000 mg | Freq: Once | INTRAVENOUS | Status: AC
  Administered 2017-07-10: 0.4 mg via INTRAVENOUS

## 2017-07-10 MED ORDER — TECHNETIUM TC 99M TETROFOSMIN IV KIT
31.7800 | PACK | Freq: Once | INTRAVENOUS | Status: AC | PRN
  Administered 2017-07-10: 31.78 via INTRAVENOUS

## 2017-07-11 LAB — NM MYOCAR MULTI W/SPECT W/WALL MOTION / EF
Estimated workload: 1 METS
Exercise duration (min): 1 min
Exercise duration (sec): 8 s
LV dias vol: 40 mL (ref 46–106)
LV sys vol: 11 mL
MPHR: 171 {beats}/min
Peak HR: 113 {beats}/min
Percent HR: 73 %
Percent of predicted max HR: 66 %
Rest HR: 61 {beats}/min
SDS: 8
SRS: 12
SSS: 13
Stage 1 Grade: 0 %
Stage 1 HR: 63 {beats}/min
Stage 1 Speed: 0 mph
Stage 2 Grade: 0 %
Stage 2 HR: 63 {beats}/min
Stage 2 Speed: 0 mph
Stage 3 Grade: 0 %
Stage 3 HR: 113 {beats}/min
Stage 3 Speed: 0 mph
Stage 4 Grade: 0 %
Stage 4 HR: 101 {beats}/min
Stage 4 Speed: 0 mph
Stage 5 DBP: 76 mmHg
Stage 5 Grade: 0 %
Stage 5 HR: 86 {beats}/min
Stage 5 SBP: 141 mmHg
Stage 5 Speed: 0 mph
TID: 0.79

## 2017-07-15 DIAGNOSIS — G890 Central pain syndrome: Secondary | ICD-10-CM | POA: Diagnosis not present

## 2017-07-15 DIAGNOSIS — M545 Low back pain: Secondary | ICD-10-CM | POA: Diagnosis not present

## 2017-07-15 DIAGNOSIS — M5432 Sciatica, left side: Secondary | ICD-10-CM | POA: Diagnosis not present

## 2017-07-15 DIAGNOSIS — R202 Paresthesia of skin: Secondary | ICD-10-CM | POA: Diagnosis not present

## 2017-07-15 DIAGNOSIS — M5431 Sciatica, right side: Secondary | ICD-10-CM | POA: Diagnosis not present

## 2017-07-15 NOTE — Telephone Encounter (Signed)
Appt was scheduled by breast center. Sara Munoz has spoken with patient and the patient is aware this is not yet approved. Working on approval, may have to reschedule.

## 2017-07-16 ENCOUNTER — Telehealth: Payer: Self-pay | Admitting: Physician Assistant

## 2017-07-16 NOTE — Telephone Encounter (Signed)
Ultrasound denied by insurance. Please let patient know that her ultraosund tomorrow will not be covered. Please have her cancel the appointment if she is not prepared to pay out of pocket.

## 2017-07-16 NOTE — Telephone Encounter (Signed)
Advised  ED 

## 2017-07-16 NOTE — Telephone Encounter (Signed)
-----   Message from Ola Spurr sent at 07/16/2017  1:14 PM EST ----- Review incoming fax

## 2017-07-17 ENCOUNTER — Ambulatory Visit: Payer: Medicaid Other

## 2017-07-17 ENCOUNTER — Telehealth: Payer: Self-pay | Admitting: Physician Assistant

## 2017-07-17 NOTE — Telephone Encounter (Signed)
°  Patient called stating she was told by Dr. Micael Hampshire M.D. ( pain clinic doc) told her she needed to have PT done.  She wants to be referred to [facility]  [phone] for physical therapy.    She has to have her PCP refer her.

## 2017-07-18 NOTE — Telephone Encounter (Signed)
I will need to see her at her next visit to document what specific the specific reasons and diagnoses for which I am referring.

## 2017-07-21 NOTE — Telephone Encounter (Signed)
Pt advised. Apt made for 07/25/2017  Thanks,   -Mickel Baas

## 2017-07-25 ENCOUNTER — Encounter: Payer: Self-pay | Admitting: Physician Assistant

## 2017-07-25 ENCOUNTER — Ambulatory Visit (INDEPENDENT_AMBULATORY_CARE_PROVIDER_SITE_OTHER): Payer: Medicaid Other | Admitting: Physician Assistant

## 2017-07-25 ENCOUNTER — Telehealth: Payer: Self-pay | Admitting: Physician Assistant

## 2017-07-25 VITALS — BP 138/80 | HR 72 | Temp 98.3°F | Resp 16 | Wt 186.0 lb

## 2017-07-25 DIAGNOSIS — R110 Nausea: Secondary | ICD-10-CM

## 2017-07-25 DIAGNOSIS — M47816 Spondylosis without myelopathy or radiculopathy, lumbar region: Secondary | ICD-10-CM

## 2017-07-25 DIAGNOSIS — I10 Essential (primary) hypertension: Secondary | ICD-10-CM

## 2017-07-25 DIAGNOSIS — G8929 Other chronic pain: Secondary | ICD-10-CM | POA: Diagnosis not present

## 2017-07-25 DIAGNOSIS — M79604 Pain in right leg: Secondary | ICD-10-CM | POA: Diagnosis not present

## 2017-07-25 DIAGNOSIS — M545 Low back pain: Secondary | ICD-10-CM | POA: Diagnosis not present

## 2017-07-25 DIAGNOSIS — M79605 Pain in left leg: Secondary | ICD-10-CM | POA: Diagnosis not present

## 2017-07-25 MED ORDER — METOPROLOL TARTRATE 25 MG PO TABS: 25.0000 mg | ORAL_TABLET | Freq: Two times a day (BID) | ORAL | 2 refills | Status: DC

## 2017-07-25 MED ORDER — ONDANSETRON 8 MG PO TBDP: 8.0000 mg | ORAL_TABLET | Freq: Two times a day (BID) | ORAL | 1 refills | Status: DC

## 2017-07-25 NOTE — Progress Notes (Signed)
Patient: Sara Munoz Female    DOB: 1965/12/25   52 y.o.   MRN: [id number] Visit Date: 07/25/2017  Today's Provider: [name], [name]   Chief Complaint  Patient presents with  . Back Pain   Subjective:    Sara Munoz is a 52 y/o woman with history of chronic back pain, hepatitis C, and blood pressure.   She was seen by [facility] in [facility] for her chronic pain. She was instructed that she must do 6 weeks of physical therapy and needs referral for this.  She recently started [place] therapy for hepatitis C. She reports nausea from this.   She also needs refill on metorpolol. Tolerating this well. Blood pressures normal.  She is due for PAP smear on next visit as well as second hepatitis B vaccination. She also reports pelvic pain, similar to pain when she had ovarian cysts. She reports she had an ovarian cyst removed laparoscopically many years ago.  Back Pain  This is a chronic problem. The current episode started more than 1 year ago. The problem occurs constantly. The problem has been gradually worsening since onset. The pain is present in the lumbar spine. Associated symptoms include headaches, tingling and weakness. Pertinent negatives include no abdominal pain, bladder incontinence, bowel incontinence, dysuria, fever or weight loss.       Allergies  Allergen Reactions  . Levofloxacin Hives and Swelling  . Flexeril [Cyclobenzaprine] Hives, Swelling and Other (See Comments)    Reaction:  Facial/lip swelling  Reaction:  Facial/lip swelling   . Keflex [Cephalexin] Hives  . Ketorolac   . Phenergan [Promethazine Hcl] Other (See Comments)    Agitation.   . Toradol [Ketorolac Tromethamine] Swelling and Other (See Comments)    Reaction:  Facial/tongue swelling  Reaction:  Facial/tongue swelling   . Tramadol Hives, Swelling and Other (See Comments)    Reaction:  Lip swelling  Reaction:  Lip swelling   . Zoloft [Sertraline Hcl] Swelling and Other  (See Comments)    Reaction:  Tongue swelling  Reaction:  Tongue swelling      Current Outpatient Medications:  .  albuterol (PROVENTIL HFA;VENTOLIN HFA) 108 (90 Base) MCG/ACT inhaler, Inhale 2 puffs into the lungs every 6 (six) hours as needed for wheezing or shortness of breath., Disp: 1 Inhaler, Rfl: 2 .  amLODipine (NORVASC) 5 MG tablet, Take 1 tablet (5 mg total) by mouth daily., Disp: 30 tablet, Rfl: 0 .  aspirin EC 81 MG tablet, Take 81 mg by mouth daily., Disp: , Rfl:  .  atorvastatin (LIPITOR) 20 MG tablet, Take 20 mg by mouth at bedtime., Disp: , Rfl:  .  budesonide-formoterol (SYMBICORT) 160-4.5 MCG/ACT inhaler, Inhale 2 puffs into the lungs 2 (two) times daily., Disp: 1 Inhaler, Rfl: 0 .  busPIRone (BUSPAR) 15 MG tablet, Take 1 tablet (15 mg total) by mouth 2 (two) times daily., Disp: 60 tablet, Rfl: 2 .  docusate sodium (COLACE) 100 MG capsule, Take 200 mg by mouth 2 (two) times daily as needed for mild constipation., Disp: , Rfl:  .  gabapentin (NEURONTIN) 600 MG tablet, Take 1 tablet (600 mg total) by mouth 3 (three) times daily., Disp: 90 tablet, Rfl: 2 .  lisinopril (PRINIVIL,ZESTRIL) 40 MG tablet, Take 1 tablet (40 mg total) by mouth daily., Disp: 30 tablet, Rfl: 0 .  MAVYRET 100-40 MG TABS, Take 1 capsule by mouth 3 (three) times daily., Disp: , Rfl:  .  metoprolol tartrate (  LOPRESSOR) 25 MG tablet, Take 1 tablet (25 mg total) by mouth 2 (two) times daily., Disp: 60 tablet, Rfl: 2 .  nitroGLYCERIN (NITROSTAT) 0.4 MG SL tablet, Place 1 tablet (0.4 mg total) under the tongue every 5 (five) minutes x 3 doses as needed for chest pain., Disp: 30 tablet, Rfl: 2 .  nystatin (MYCOSTATIN/NYSTOP) powder, Apply topically 4 (four) times daily., Disp: 15 g, Rfl: 0 .  omeprazole (PRILOSEC) 40 MG capsule, Take 1 capsule (40 mg total) daily by mouth., Disp: 90 capsule, Rfl: 3 .  ondansetron (ZOFRAN-ODT) 8 MG disintegrating tablet, Take 1 tablet (8 mg total) by mouth 2 (two) times daily., Disp:  20 tablet, Rfl: 1 .  Tiotropium Bromide Monohydrate (SPIRIVA RESPIMAT) 2.5 MCG/ACT AERS, Inhale 1 puff into the lungs daily., Disp: 4 g, Rfl: 2  Review of Systems  Constitutional: Positive for appetite change and fatigue. Negative for activity change, chills, diaphoresis, fever, unexpected weight change and weight loss.  Gastrointestinal: Positive for nausea. Negative for abdominal pain and bowel incontinence.       Nausea secondary to medications.  Genitourinary: Negative.  Negative for bladder incontinence and dysuria.  Musculoskeletal: Positive for back pain, myalgias and neck pain. Negative for arthralgias, gait problem, joint swelling and neck stiffness.  Neurological: Positive for tingling, weakness and headaches. Negative for dizziness and light-headedness.    Social History   Tobacco Use  . Smoking status: Current Every Day Smoker    Packs/day: 0.50    Types: Cigarettes  . Smokeless tobacco: Never Used  Substance Use Topics  . Alcohol use: No    Alcohol/week: 0.0 oz   Objective:   BP 138/80 (BP Location: Right Arm, Patient Position: Sitting, Cuff Size: Normal)   Pulse 72   Temp 98.3 F (36.8 C) (Oral)   Resp 16   Wt 186 lb (84.4 kg)   LMP  (LMP Unknown) Comment: negative urine preg. test today  BMI 31.93 kg/m  Vitals:   07/25/17 0815  BP: 138/80  Pulse: 72  Resp: 16  Temp: 98.3 F (36.8 C)  TempSrc: Oral  Weight: 186 lb (84.4 kg)     Physical Exam  Constitutional: She is oriented to person, place, and time. She appears well-developed and well-nourished.  Cardiovascular: Normal rate, regular rhythm, normal heart sounds and intact distal pulses.  No murmur heard. Pulmonary/Chest: Effort normal and breath sounds normal.  Neurological: She is alert and oriented to person, place, and time.  Skin: Skin is warm and dry.  Psychiatric: She has a normal mood and affect. Her behavior is normal.        Assessment & Plan:     1. Hypertension, unspecified  type  Chronic, stable. Refill as below and continue medications.   - metoprolol tartrate (LOPRESSOR) 25 MG tablet; Take 1 tablet (25 mg total) by mouth 2 (two) times daily.  Dispense: 60 tablet; Refill: 2  2. Lumbar facet syndrome (Bilateral) (L>R)  Worsening, refer to PT as below per pain mgmt instructions.  - Ambulatory referral to Physical Therapy  3. Chronic lower extremity pain (Location of Secondary source of pain) (Bilateral) (L>R)  - Ambulatory referral to Physical Therapy  4. Chronic low back pain, unspecified back pain laterality, with sciatica presence unspecified  - Ambulatory referral to Physical Therapy  5. Nausea  - ondansetron (ZOFRAN-ODT) 8 MG disintegrating tablet; Take 1 tablet (8 mg total) by mouth 2 (two) times daily.  Dispense: 20 tablet; Refill: 1  Return if symptoms worsen or fail to  improve.  The entirety of the information documented in the History of Present Illness, Review of Systems and Physical Exam were personally obtained by me. Portions of this information were initially documented by Ashley Royalty, CMA and reviewed by me for thoroughness and accuracy.        [name], [name]  [facility]

## 2017-07-25 NOTE — Telephone Encounter (Signed)
Patient brought in a Medicaid Transportation Exception Verification Form to be filled out by the provider. Patient states after the provider has filled out form then fax it to [phone], after it's been faxed it's to be placed in patients chart. I asked patient if she would like a copy for herself after it was filled out & faxed, patients answer was no just place it in her chart. Form was placed in provider's box to be filled out. Thanks CC

## 2017-07-25 NOTE — Patient Instructions (Signed)

## 2017-07-25 NOTE — Telephone Encounter (Signed)
Form filled out and faxed 

## 2017-07-28 ENCOUNTER — Ambulatory Visit: Payer: Self-pay | Admitting: Neurology

## 2017-07-29 ENCOUNTER — Ambulatory Visit (INDEPENDENT_AMBULATORY_CARE_PROVIDER_SITE_OTHER): Payer: Medicaid Other | Admitting: Physician Assistant

## 2017-07-29 ENCOUNTER — Encounter: Payer: Self-pay | Admitting: Physician Assistant

## 2017-07-29 VITALS — BP 128/86 | HR 60 | Temp 98.3°F | Resp 16 | Wt 187.0 lb

## 2017-07-29 DIAGNOSIS — K732 Chronic active hepatitis, not elsewhere classified: Secondary | ICD-10-CM

## 2017-07-29 DIAGNOSIS — Z23 Encounter for immunization: Secondary | ICD-10-CM

## 2017-07-29 DIAGNOSIS — R102 Pelvic and perineal pain: Secondary | ICD-10-CM | POA: Diagnosis not present

## 2017-07-29 DIAGNOSIS — Z124 Encounter for screening for malignant neoplasm of cervix: Secondary | ICD-10-CM | POA: Diagnosis not present

## 2017-07-29 NOTE — Patient Instructions (Signed)
Please schedule follow up appointment with GI.   Pap Test Why am I having this test? A pap test is sometimes called a pap smear. It is a screening test that is used to check for signs of cancer of the vagina, cervix, and uterus. The test can also identify the presence of infection or precancerous changes. Your health care provider will likely recommend you have this test done on a regular basis. This test may be done:  Every 3 years, starting at age 50.  Every 5 years, in combination with testing for the presence of human papillomavirus (HPV).  More or less often depending on other medical conditions.  What kind of sample is taken? Using a small cotton swab, plastic spatula, or brush, your health care provider will collect a sample of cells from the surface of your cervix. Your cervix is the opening to your uterus, also called a womb. Secretions from the cervix and vagina may also be collected. How do I prepare for this test?  Be aware of where you are in your menstrual cycle. You may be asked to reschedule the test if you are menstruating on the day of the test.  You may need to reschedule if you have a known vaginal infection on the day of the test.  You may be asked to avoid douching or taking a bath the day before or the day of the test.  Some medicines can cause abnormal test results, such as digitalis and tetracycline. Talk with your health care provider before your test if you take one of these medicines. What do the results mean? Abnormal test results may indicate a number of health conditions. These may include:  Cancer. Although pap test results cannot be used to diagnose cancer of the cervix, vagina, or uterus, they may suggest the possibility of cancer. Further tests would be required to determine if cancer is present.  Sexually transmitted disease.  Fungal infection.  Parasite infection.  Herpes infection.  A condition causing or contributing to infertility.  It is  your responsibility to obtain your test results. Ask the lab or department performing the test when and how you will get your results. Contact your health care provider to discuss any questions you have about your results. Talk with your health care provider to discuss your results, treatment options, and if necessary, the need for more tests. Talk with your health care provider if you have any questions about your results. This information is not intended to replace advice given to you by your health care provider. Make sure you discuss any questions you have with your health care provider. Document Released: 08/31/2002 Document Revised: 02/14/2016 Document Reviewed: 11/01/2013 Elsevier Interactive Patient Education  [employer].

## 2017-07-29 NOTE — Progress Notes (Signed)
Patient: Sara Munoz Female    DOB: 1965-07-27   52 y.o.   MRN: [id number] Visit Date: 07/29/2017  Today's Provider: [name], [name]   Chief Complaint  Patient presents with  . Gynecologic Exam  . Immunizations  . Pelvic Pain   Subjective:    HPI  Sara Munoz is a 52 y/o woman with chronic active hepatitis C presenting here today for second series Hep B and also for PAP smear.  She is being treated with Mavyret by Dr. Vicente Males at [place]. She should be having follow up with him this month.  She is also due for PAP smear. She cannot remember the last. She also reports history of left ovarian cyst and endorses current pelvic pain over the past several months. She does still have menstrual cycles. Denies vaginal discharge or concern for STI. She denies bloody diarrhea, change in bowel movements. Denies urinary issues.    Allergies  Allergen Reactions  . Levofloxacin Hives and Swelling  . Flexeril [Cyclobenzaprine] Hives, Swelling and Other (See Comments)    Reaction:  Facial/lip swelling  Reaction:  Facial/lip swelling   . Keflex [Cephalexin] Hives  . Ketorolac   . Phenergan [Promethazine Hcl] Other (See Comments)    Agitation.   . Toradol [Ketorolac Tromethamine] Swelling and Other (See Comments)    Reaction:  Facial/tongue swelling  Reaction:  Facial/tongue swelling   . Tramadol Hives, Swelling and Other (See Comments)    Reaction:  Lip swelling  Reaction:  Lip swelling   . Zoloft [Sertraline Hcl] Swelling and Other (See Comments)    Reaction:  Tongue swelling  Reaction:  Tongue swelling      Current Outpatient Medications:  .  albuterol (PROVENTIL HFA;VENTOLIN HFA) 108 (90 Base) MCG/ACT inhaler, Inhale 2 puffs into the lungs every 6 (six) hours as needed for wheezing or shortness of breath., Disp: 1 Inhaler, Rfl: 2 .  amLODipine (NORVASC) 5 MG tablet, Take 1 tablet (5 mg total) by mouth daily., Disp: 30 tablet, Rfl: 0 .  aspirin EC 81 MG tablet,  Take 81 mg by mouth daily., Disp: , Rfl:  .  atorvastatin (LIPITOR) 20 MG tablet, Take 20 mg by mouth at bedtime., Disp: , Rfl:  .  budesonide-formoterol (SYMBICORT) 160-4.5 MCG/ACT inhaler, Inhale 2 puffs into the lungs 2 (two) times daily., Disp: 1 Inhaler, Rfl: 0 .  busPIRone (BUSPAR) 15 MG tablet, Take 1 tablet (15 mg total) by mouth 2 (two) times daily., Disp: 60 tablet, Rfl: 2 .  docusate sodium (COLACE) 100 MG capsule, Take 200 mg by mouth 2 (two) times daily as needed for mild constipation., Disp: , Rfl:  .  gabapentin (NEURONTIN) 600 MG tablet, Take 1 tablet (600 mg total) by mouth 3 (three) times daily., Disp: 90 tablet, Rfl: 2 .  lisinopril (PRINIVIL,ZESTRIL) 40 MG tablet, Take 1 tablet (40 mg total) by mouth daily., Disp: 30 tablet, Rfl: 0 .  MAVYRET 100-40 MG TABS, Take 1 capsule by mouth 3 (three) times daily., Disp: , Rfl:  .  metoprolol tartrate (LOPRESSOR) 25 MG tablet, Take 1 tablet (25 mg total) by mouth 2 (two) times daily., Disp: 60 tablet, Rfl: 2 .  nitroGLYCERIN (NITROSTAT) 0.4 MG SL tablet, Place 1 tablet (0.4 mg total) under the tongue every 5 (five) minutes x 3 doses as needed for chest pain., Disp: 30 tablet, Rfl: 2 .  nystatin (MYCOSTATIN/NYSTOP) powder, Apply topically 4 (four) times daily., Disp: 15 g, Rfl:  0 .  omeprazole (PRILOSEC) 40 MG capsule, Take 1 capsule (40 mg total) daily by mouth., Disp: 90 capsule, Rfl: 3 .  ondansetron (ZOFRAN-ODT) 8 MG disintegrating tablet, Take 1 tablet (8 mg total) by mouth 2 (two) times daily., Disp: 20 tablet, Rfl: 1 .  Tiotropium Bromide Monohydrate (SPIRIVA RESPIMAT) 2.5 MCG/ACT AERS, Inhale 1 puff into the lungs daily., Disp: 4 g, Rfl: 2  Review of Systems  Social History   Tobacco Use  . Smoking status: Current Every Day Smoker    Packs/day: 0.50    Types: Cigarettes  . Smokeless tobacco: Never Used  Substance Use Topics  . Alcohol use: No    Alcohol/week: 0.0 oz   Objective:   BP 128/86 (BP Location: Left Arm,  Patient Position: Sitting, Cuff Size: Normal)   Pulse 60   Temp 98.3 F (36.8 C) (Oral)   Resp 16   Wt 187 lb (84.8 kg)   LMP  (LMP Unknown) Comment: negative urine preg. test today  BMI 32.10 kg/m  Vitals:   07/29/17 0904  BP: 128/86  Pulse: 60  Resp: 16  Temp: 98.3 F (36.8 C)  TempSrc: Oral  Weight: 187 lb (84.8 kg)     Physical Exam  Constitutional: She appears well-developed and well-nourished.  Abdominal: There is tenderness.    Genitourinary: There is no rash, tenderness, lesion or injury on the right labia. There is no rash, tenderness, lesion or injury on the left labia. Uterus is not deviated, not enlarged, not fixed and not tender. Cervix exhibits discharge. Cervix exhibits no motion tenderness and no friability. Right adnexum displays no mass, no tenderness and no fullness. Left adnexum displays tenderness. Left adnexum displays no mass and no fullness. No erythema, tenderness or bleeding in the vagina. No foreign body in the vagina. No signs of injury around the vagina. No vaginal discharge found.  Skin: Skin is warm and dry.  Psychiatric: She has a normal mood and affect. Her behavior is normal.        Assessment & Plan:     1. Chronic active hepatitis ([place])  Reminded her to schedule appt with GI.  - Hepatitis B vaccine adult IM  2. Need for hepatitis B vaccination  - Hepatitis B vaccine adult IM  3. Cervical cancer screening  - Pap IG and HPV (high risk) DNA detection  4. Pelvic pain  - US PELVIC COMPLETE WITH TRANSVAGINAL; Future  Return in about 4 months (around 11/26/2017) for Hep A , B vacc; chronic illness.  The entirety of the information documented in the History of Present Illness, Review of Systems and Physical Exam were personally obtained by me. Portions of this information were initially documented by Ashley Royalty, CMA and reviewed by me for thoroughness and accuracy.         [name], [name]  [facility]

## 2017-07-31 ENCOUNTER — Telehealth: Payer: Self-pay

## 2017-07-31 LAB — PAP IG AND HPV HIGH-RISK
HPV, high-risk: NEGATIVE
PAP Smear Comment: 0

## 2017-07-31 NOTE — Telephone Encounter (Signed)
-----   Message from [name], [place] sent at 07/31/2017  8:26 AM EST ----- PAP is negative, HPV negative. Repeat 5 years.

## 2017-07-31 NOTE — Telephone Encounter (Signed)
Pt advised.   Thanks,   -Layman Gully  

## 2017-07-31 NOTE — Telephone Encounter (Signed)
Pt advised that [facility] will contact her.I provided pt their contact information

## 2017-07-31 NOTE — Telephone Encounter (Signed)
Patient called wanting to know the status of her PT referral. Contact number is correct. Thanks!

## 2017-08-05 ENCOUNTER — Ambulatory Visit (INDEPENDENT_AMBULATORY_CARE_PROVIDER_SITE_OTHER): Payer: Medicaid Other | Admitting: Psychiatry

## 2017-08-05 ENCOUNTER — Other Ambulatory Visit: Payer: Self-pay | Admitting: Physician Assistant

## 2017-08-05 ENCOUNTER — Encounter: Payer: Self-pay | Admitting: Psychiatry

## 2017-08-05 ENCOUNTER — Other Ambulatory Visit: Payer: Self-pay | Admitting: Internal Medicine

## 2017-08-05 ENCOUNTER — Ambulatory Visit: Payer: Medicaid Other | Admitting: Psychiatry

## 2017-08-05 VITALS — BP 129/85 | HR 77 | Temp 98.4°F | Wt 186.8 lb

## 2017-08-05 DIAGNOSIS — F1011 Alcohol abuse, in remission: Secondary | ICD-10-CM | POA: Diagnosis not present

## 2017-08-05 DIAGNOSIS — I10 Essential (primary) hypertension: Secondary | ICD-10-CM

## 2017-08-05 DIAGNOSIS — F17200 Nicotine dependence, unspecified, uncomplicated: Secondary | ICD-10-CM | POA: Diagnosis not present

## 2017-08-05 DIAGNOSIS — F411 Generalized anxiety disorder: Secondary | ICD-10-CM | POA: Diagnosis not present

## 2017-08-05 DIAGNOSIS — F330 Major depressive disorder, recurrent, mild: Secondary | ICD-10-CM

## 2017-08-05 DIAGNOSIS — F4310 Post-traumatic stress disorder, unspecified: Secondary | ICD-10-CM

## 2017-08-05 MED ORDER — HYDROXYZINE PAMOATE 25 MG PO CAPS: 25.0000 mg | ORAL_CAPSULE | Freq: Every day | ORAL | 1 refills | Status: DC | PRN

## 2017-08-05 MED ORDER — VENLAFAXINE HCL ER 37.5 MG PO CP24: 37.5000 mg | ORAL_CAPSULE | Freq: Every day | ORAL | 1 refills | Status: DC

## 2017-08-05 MED ORDER — ZOLPIDEM TARTRATE 5 MG PO TABS: 5.0000 mg | ORAL_TABLET | Freq: Every evening | ORAL | 1 refills | Status: DC | PRN

## 2017-08-05 MED ORDER — LISINOPRIL 40 MG PO TABS: 40.0000 mg | ORAL_TABLET | Freq: Every day | ORAL | 1 refills | Status: DC

## 2017-08-05 MED ORDER — METOPROLOL TARTRATE 25 MG PO TABS: 25.0000 mg | ORAL_TABLET | Freq: Two times a day (BID) | ORAL | 1 refills | Status: DC

## 2017-08-05 NOTE — Telephone Encounter (Signed)
Patient needs refills on Lisinopril 40 mg. And Metoprolol 25 mg.  Called to [facility] in [place]

## 2017-08-05 NOTE — Patient Instructions (Signed)
Hydroxyzine capsules or tablets What is this medicine? HYDROXYZINE (hye [place] i zeen) is an antihistamine. This medicine is used to treat allergy symptoms. It is also used to treat anxiety and tension. This medicine can be used with other medicines to induce sleep before surgery. This medicine may be used for other purposes; ask your health care provider or pharmacist if you have questions. COMMON BRAND NAME(S): ANX, Atarax, Rezine, Vistaril What should I tell my health care provider before I take this medicine? They need to know if you have any of these conditions: -any chronic illness -difficulty passing urine -glaucoma -heart disease -kidney disease -liver disease -lung disease -an unusual or allergic reaction to hydroxyzine, cetirizine, other medicines, foods, dyes, or preservatives -pregnant or trying to get pregnant -breast-feeding How should I use this medicine? Take this medicine by mouth with a full glass of water. Follow the directions on the prescription label. You may take this medicine with food or on an empty stomach. Take your medicine at regular intervals. Do not take your medicine more often than directed. Talk to your pediatrician regarding the use of this medicine in children. Special care may be needed. While this drug may be prescribed for children as young as 75 years of age for selected conditions, precautions do apply. Patients over 62 years old may have a stronger reaction and need a smaller dose. Overdosage: If you think you have taken too much of this medicine contact a poison control center or emergency room at once. NOTE: This medicine is only for you. Do not share this medicine with others. What if I miss a dose? If you miss a dose, take it as soon as you can. If it is almost time for your next dose, take only that dose. Do not take double or extra doses. What may interact with this medicine? -alcohol -barbiturate medicines for sleep or seizures -medicines for  colds, allergies -medicines for depression, anxiety, or emotional disturbances -medicines for pain -medicines for sleep -muscle relaxants This list may not describe all possible interactions. Give your health care provider a list of all the medicines, herbs, non-prescription drugs, or dietary supplements you use. Also tell them if you smoke, drink alcohol, or use illegal drugs. Some items may interact with your medicine. What should I watch for while using this medicine? Tell your doctor or health care professional if your symptoms do not improve. You may get drowsy or dizzy. Do not drive, use machinery, or do anything that needs mental alertness until you know how this medicine affects you. Do not stand or sit up quickly, especially if you are an older patient. This reduces the risk of dizzy or fainting spells. Alcohol may interfere with the effect of this medicine. Avoid alcoholic drinks. Your mouth may get dry. Chewing sugarless gum or sucking hard candy, and drinking plenty of water may help. Contact your doctor if the problem does not go away or is severe. This medicine may cause dry eyes and blurred vision. If you wear contact lenses you may feel some discomfort. Lubricating drops may help. See your eye doctor if the problem does not go away or is severe. If you are receiving skin tests for allergies, tell your doctor you are using this medicine. What side effects may I notice from receiving this medicine? Side effects that you should report to your doctor or health care professional as soon as possible: -fast or irregular heartbeat -difficulty passing urine -seizures -slurred speech or confusion -tremor Side effects that  usually do not require medical attention (report to your doctor or health care professional if they continue or are bothersome): -constipation -drowsiness -fatigue -headache -stomach upset This list may not describe all possible side effects. Call your doctor for  medical advice about side effects. You may report side effects to FDA at 1-800-FDA-1088. Where should I keep my medicine? Keep out of the reach of children. Store at room temperature between 15 and 30 degrees C (59 and 86 degrees F). Keep container tightly closed. Throw away any unused medicine after the expiration date. NOTE: This sheet is a summary. It may not cover all possible information. If you have questions about this medicine, talk to your doctor, pharmacist, or health care provider.  2018 [employer]vier/Gold Standard (2007-10-23 14:50:59) Zolpidem tablets What is this medicine? ZOLPIDEM (zole PI dem) is used to treat insomnia. This medicine helps you to fall asleep and sleep through the night. This medicine may be used for other purposes; ask your health care provider or pharmacist if you have questions. COMMON BRAND NAME(S): Ambien What should I tell my health care provider before I take this medicine? They need to know if you have any of these conditions: -depression -history of drug abuse or addiction -if you often drink alcohol -liver disease -lung or breathing disease -myasthenia gravis -sleep apnea -suicidal thoughts, plans, or attempt; a previous suicide attempt by you or a family member -an unusual or allergic reaction to zolpidem, other medicines, foods, dyes, or preservatives -pregnant or trying to get pregnant -breast-feeding How should I use this medicine? Take this medicine by mouth with a glass of water. Follow the directions on the prescription label. It is better to take this medicine on an empty stomach and only when you are ready for bed. Do not take your medicine more often than directed. If you have been taking this medicine for several weeks and suddenly stop taking it, you may get unpleasant withdrawal symptoms. Your doctor or health care professional may want to gradually reduce the dose. Do not stop taking this medicine on your own. Always follow your doctor or  health care professional's advice. A special MedGuide will be given to you by the pharmacist with each prescription and refill. Be sure to read this information carefully each time. Talk to your pediatrician regarding the use of this medicine in children. Special care may be needed. Overdosage: If you think you have taken too much of this medicine contact a poison control center or emergency room at once. NOTE: This medicine is only for you. Do not share this medicine with others. What if I miss a dose? This does not apply. This medicine should only be taken immediately before going to sleep. Do not take double or extra doses. What may interact with this medicine? -alcohol -antihistamines for allergy, cough and cold -certain medicines for anxiety or sleep -certain medicines for depression, like amitriptyline, fluoxetine, sertraline -certain medicines for fungal infections like ketoconazole and itraconazole -certain medicines for seizures like phenobarbital, primidone -ciprofloxacin -dietary supplements for sleep, like valerian or kava kava -general anesthetics like halothane, isoflurane, methoxyflurane, propofol -local anesthetics like lidocaine, pramoxine, tetracaine -medicines that relax muscles for surgery -narcotic medicines for pain -phenothiazines like chlorpromazine, mesoridazine, prochlorperazine, thioridazine -rifampin This list may not describe all possible interactions. Give your health care provider a list of all the medicines, herbs, non-prescription drugs, or dietary supplements you use. Also tell them if you smoke, drink alcohol, or use illegal drugs. Some items may interact with your medicine.  What should I watch for while using this medicine? Visit your doctor or health care professional for regular checks on your progress. Keep a regular sleep schedule by going to bed at about the same time each night. Avoid caffeine-containing drinks in the evening hours. When sleep  medicines are used every night for more than a few weeks, they may stop working. Talk to your doctor if you still have trouble sleeping. After taking this medicine for sleep, you may get up out of bed while not being fully awake and do an activity that you do not know you are doing. The next morning, you may have no memory of the event. Activities such as driving a car ("sleep-driving"), making and eating food, talking on the phone, sexual activity, and sleep-walking have been reported. Call your doctor right away if you find out you have done any of these activities. Do not take this medicine if you have used alcohol that evening or before bed or taken another medicine for sleep since your risk of doing these sleep-related activities will be increased. Wait for at least 8 hours after you take a dose before driving or doing other activities that require full mental alertness. Do not take this medicine unless you are able to stay in bed for a full night (7 to 8 hours) before you must be active again. You may have a decrease in mental alertness the day after use, even if you feel that you are fully awake. Tell your doctor if you will need to perform activities requiring full alertness, such as driving, the next day. Do not stand or sit up quickly after taking this medicine, especially if you are an older patient. This reduces the risk of dizzy or fainting spells. If you or your family notice any changes in your behavior, such as new or worsening depression, thoughts of harming yourself, anxiety, other unusual or disturbing thoughts, or memory loss, call your doctor right away. After you stop taking this medicine, you may have trouble falling asleep. This is called rebound insomnia. This problem usually goes away on its own after 1 or 2 nights. What side effects may I notice from receiving this medicine? Side effects that you should report to your doctor or health care professional as soon as possible: -allergic  reactions like skin rash, itching or hives, swelling of the face, lips, or tongue -breathing problems -changes in vision -confusion -depressed mood or other changes in moods or emotions -feeling faint or lightheaded, falls -hallucinations -loss of balance or coordination -loss of memory -numbness or tingling of the tongue -restlessness, excitability, or feelings of anxiety or agitation -signs and symptoms of liver injury like dark yellow or brown urine; general ill feeling or flu-like symptoms; light-colored stools; loss of appetite; nausea; right upper belly pain; unusually weak or tired; yellowing of the eyes or skin -suicidal thoughts -unusual activities while asleep like driving, eating, making phone calls, or sexual activity Side effects that usually do not require medical attention (report to your doctor or health care professional if they continue or are bothersome): -dizziness -drowsiness the day after you take this medicine -headache This list may not describe all possible side effects. Call your doctor for medical advice about side effects. You may report side effects to FDA at 1-800-FDA-1088. Where should I keep my medicine? Keep out of the reach of children. This medicine can be abused. Keep your medicine in a safe place to protect it from theft. Do not share this medicine with  anyone. Selling or giving away this medicine is dangerous and against the law. This medicine may cause accidental overdose and death if taken by other adults, children, or pets. Mix any unused medicine with a substance like cat litter or coffee grounds. Then throw the medicine away in a sealed container like a sealed bag or a coffee can with a lid. Do not use the medicine after the expiration date. Store at room temperature between 20 and 25 degrees C (68 and 77 degrees F). NOTE: This sheet is a summary. It may not cover all possible information. If you have questions about this medicine, talk to your  doctor, pharmacist, or health care provider.  2018 [employer]/Gold Standard (2015-09-13 14:38:20) Venlafaxine extended-release capsules What is this medicine? VENLAFAXINE(VEN la fax een) is used to treat depression, anxiety and panic disorder. This medicine may be used for other purposes; ask your health care provider or pharmacist if you have questions. COMMON BRAND NAME(S): Effexor XR What should I tell my health care provider before I take this medicine? They need to know if you have any of these conditions: -bleeding disorders -glaucoma -heart disease -high blood pressure -high cholesterol -kidney disease -liver disease -low levels of sodium in the blood -mania or bipolar disorder -seizures -suicidal thoughts, plans, or attempt; a previous suicide attempt by you or a family -take medicines that treat or prevent blood clots -thyroid disease -an unusual or allergic reaction to venlafaxine, desvenlafaxine, other medicines, foods, dyes, or preservatives -pregnant or trying to get pregnant -breast-feeding How should I use this medicine? Take this medicine by mouth with a full glass of water. Follow the directions on the prescription label. Do not cut, crush, or chew this medicine. Take it with food. If needed, the capsule may be carefully opened and the entire contents sprinkled on a spoonful of cool applesauce. Swallow the applesauce/pellet mixture right away without chewing and follow with a glass of water to ensure complete swallowing of the pellets. Try to take your medicine at about the same time each day. Do not take your medicine more often than directed. Do not stop taking this medicine suddenly except upon the advice of your doctor. Stopping this medicine too quickly may cause serious side effects or your condition may worsen. A special MedGuide will be given to you by the pharmacist with each prescription and refill. Be sure to read this information carefully each time. Talk to  your pediatrician regarding the use of this medicine in children. Special care may be needed. Overdosage: If you think you have taken too much of this medicine contact a poison control center or emergency room at once. NOTE: This medicine is only for you. Do not share this medicine with others. What if I miss a dose? If you miss a dose, take it as soon as you can. If it is almost time for your next dose, take only that dose. Do not take double or extra doses. What may interact with this medicine? Do not take this medicine with any of the following medications: -certain medicines for fungal infections like fluconazole, itraconazole, ketoconazole, posaconazole, voriconazole -cisapride -desvenlafaxine -dofetilide -dronedarone -duloxetine -levomilnacipran -linezolid -MAOIs like Carbex, Eldepryl, Marplan, Nardil, and Parnate -methylene blue (injected into a vein) -milnacipran -pimozide -thioridazine -ziprasidone This medicine may also interact with the following medications: -amphetamines -aspirin and aspirin-like medicines -certain medicines for depression, anxiety, or psychotic disturbances -certain medicines for migraine headaches like almotriptan, eletriptan, frovatriptan, naratriptan, rizatriptan, sumatriptan, zolmitriptan -certain medicines for sleep -certain medicines that  treat or prevent blood clots like dalteparin, enoxaparin, warfarin -cimetidine -clozapine -diuretics -fentanyl -furazolidone -indinavir -isoniazid -lithium -metoprolol -NSAIDS, medicines for pain and inflammation, like ibuprofen or naproxen -other medicines that prolong the QT interval (cause an abnormal heart rhythm) -procarbazine -rasagiline -supplements like St. John's wort, kava kava, valerian -tramadol -tryptophan This list may not describe all possible interactions. Give your health care provider a list of all the medicines, herbs, non-prescription drugs, or dietary supplements you use. Also tell  them if you smoke, drink alcohol, or use illegal drugs. Some items may interact with your medicine. What should I watch for while using this medicine? Tell your doctor if your symptoms do not get better or if they get worse. Visit your doctor or health care professional for regular checks on your progress. Because it may take several weeks to see the full effects of this medicine, it is important to continue your treatment as prescribed by your doctor. Patients and their families should watch out for new or worsening thoughts of suicide or depression. Also watch out for sudden changes in feelings such as feeling anxious, agitated, panicky, irritable, hostile, aggressive, impulsive, severely restless, overly excited and hyperactive, or not being able to sleep. If this happens, especially at the beginning of treatment or after a change in dose, call your health care professional. This medicine can cause an increase in blood pressure. Check with your doctor for instructions on monitoring your blood pressure while taking this medicine. You may get drowsy or dizzy. Do not drive, use machinery, or do anything that needs mental alertness until you know how this medicine affects you. Do not stand or sit up quickly, especially if you are an older patient. This reduces the risk of dizzy or fainting spells. Alcohol may interfere with the effect of this medicine. Avoid alcoholic drinks. Your mouth may get dry. Chewing sugarless gum, sucking hard candy and drinking plenty of water will help. Contact your doctor if the problem does not go away or is severe. What side effects may I notice from receiving this medicine? Side effects that you should report to your doctor or health care professional as soon as possible: -allergic reactions like skin rash, itching or hives, swelling of the face, lips, or tongue -anxious -breathing problems -confusion -changes in vision -chest pain -confusion -elevated mood, decreased  need for sleep, racing thoughts, impulsive behavior -eye pain -fast, irregular heartbeat -feeling faint or lightheaded, falls -feeling agitated, angry, or irritable -hallucination, loss of contact with reality -high blood pressure -loss of balance or coordination -palpitations -redness, blistering, peeling or loosening of the skin, including inside the mouth -restlessness, pacing, inability to keep still -seizures -stiff muscles -suicidal thoughts or other mood changes -trouble passing urine or change in the amount of urine -trouble sleeping -unusual bleeding or bruising -unusually weak or tired -vomiting Side effects that usually do not require medical attention (report to your doctor or health care professional if they continue or are bothersome): -change in sex drive or performance -change in appetite or weight -constipation -dizziness -dry mouth -headache -increased sweating -nausea -tired This list may not describe all possible side effects. Call your doctor for medical advice about side effects. You may report side effects to FDA at 1-800-FDA-1088. Where should I keep my medicine? Keep out of the reach of children. Store at a controlled temperature between 20 and 25 degrees C (68 degrees and 77 degrees F), in a dry place. Throw away any unused medicine after the expiration date. NOTE: This  sheet is a summary. It may not cover all possible information. If you have questions about this medicine, talk to your doctor, pharmacist, or health care provider.  2018 [employer]/Gold Standard (2015-11-09 18:38:02)
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
## 2017-08-06 ENCOUNTER — Ambulatory Visit: Payer: Medicaid Other | Attending: Physician Assistant | Admitting: Physical Therapy

## 2017-08-06 DIAGNOSIS — M5442 Lumbago with sciatica, left side: Secondary | ICD-10-CM | POA: Diagnosis present

## 2017-08-06 DIAGNOSIS — R293 Abnormal posture: Secondary | ICD-10-CM | POA: Insufficient documentation

## 2017-08-06 DIAGNOSIS — M6281 Muscle weakness (generalized): Secondary | ICD-10-CM | POA: Diagnosis present

## 2017-08-06 DIAGNOSIS — M5441 Lumbago with sciatica, right side: Secondary | ICD-10-CM | POA: Insufficient documentation

## 2017-08-06 DIAGNOSIS — G8929 Other chronic pain: Secondary | ICD-10-CM | POA: Insufficient documentation
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
## 2017-08-07 ENCOUNTER — Encounter: Payer: Self-pay | Admitting: Physical Therapy

## 2017-08-07 ENCOUNTER — Ambulatory Visit
Admission: RE | Admit: 2017-08-07 | Discharge: 2017-08-07 | Disposition: A | Discharge status: Home / Self Care | Payer: Medicaid Other | Source: Ambulatory Visit | Attending: Physician Assistant | Admitting: Physician Assistant

## 2017-08-07 ENCOUNTER — Other Ambulatory Visit: Payer: Self-pay

## 2017-08-07 DIAGNOSIS — D259 Leiomyoma of uterus, unspecified: Secondary | ICD-10-CM | POA: Diagnosis not present

## 2017-08-07 DIAGNOSIS — R102 Pelvic and perineal pain: Secondary | ICD-10-CM | POA: Diagnosis present

## 2017-08-07 DIAGNOSIS — N83292 Other ovarian cyst, left side: Secondary | ICD-10-CM | POA: Diagnosis not present

## 2017-08-07 DIAGNOSIS — Z87898 Personal history of other specified conditions: Secondary | ICD-10-CM | POA: Diagnosis present

## 2017-08-11 ENCOUNTER — Telehealth: Payer: Self-pay

## 2017-08-11 DIAGNOSIS — N83202 Unspecified ovarian cyst, left side: Secondary | ICD-10-CM

## 2017-08-11 NOTE — Telephone Encounter (Signed)
-----   Message from [name], [place] sent at 08/11/2017  2:16 PM EST ----- Ultrasound shows small uterine fibroid. Also shows "benign appearing complicated cyst" on left ovary. This may be cause of her pelvic pain. Would suggest referral to OBGYN if patient desires further evaluation/treatment.

## 2017-08-11 NOTE — Telephone Encounter (Signed)
Spoke with patient and advised of report, placed order in chart for referral for further evaluation. KW

## 2017-08-14 ENCOUNTER — Ambulatory Visit: Payer: Medicaid Other | Admitting: Physical Therapy

## 2017-08-18 ENCOUNTER — Encounter: Payer: Self-pay | Admitting: Obstetrics and Gynecology

## 2017-08-18 ENCOUNTER — Ambulatory Visit: Payer: Medicaid Other | Admitting: Obstetrics and Gynecology

## 2017-08-18 VITALS — BP 118/68 | HR 74 | Ht 64.0 in | Wt 185.0 lb

## 2017-08-18 DIAGNOSIS — G8929 Other chronic pain: Secondary | ICD-10-CM | POA: Diagnosis not present

## 2017-08-18 DIAGNOSIS — N83202 Unspecified ovarian cyst, left side: Secondary | ICD-10-CM

## 2017-08-18 DIAGNOSIS — Z113 Encounter for screening for infections with a predominantly sexual mode of transmission: Secondary | ICD-10-CM

## 2017-08-18 DIAGNOSIS — R102 Pelvic and perineal pain: Secondary | ICD-10-CM

## 2017-08-18 NOTE — Progress Notes (Signed)
Obstetrics & Gynecology Office Visit   Chief Complaint:  Chief Complaint  Patient presents with  . Ovarian Cyst/Fibroids    Referral by [facility] family Practice    History of Present Illness: The patient is a 52 y.o. female presenting for consultation at the request of [place], [place]) concerning a recently imaged left adnexal cyst.  Initial presentation was prompted by pelvic pain.  Previous transvaginal ultrasound imaging demonstrated dimensions of 1.4cm.  Appearance was notable simple cyst, omental caking, no omental caking, acites and absence of ascites. The patient endorses associated symptoms of pelvic pain.  The patient denies associated symptoms of  early satiety, weight gain, weight loss, night sweats, vaginal bleeding, constipation, diarrhea, nausea and emesis.  There is not a notable family history of ovarian cancer, uterine cancer, breast cancer, or colon cancer.  She does have a history of a prior ovarian cystectomy in 2010.  Denies vaginal discharge.  Very irregular menses and some vasomotor symptoms.    Review of Systems: 10 point review of systems negative unless otherwise noted in HPI  Past Medical History:  Past Medical History:  Diagnosis Date  . Anxiety   . Asthma   . Atypical chest pain 08/02/2015  . CAD (coronary artery disease)   . Chronic back pain   . COPD (chronic obstructive pulmonary disease) ([place])   . Coronary artery disease    a. Mild-non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/2014  . GERD (gastroesophageal reflux disease)   . Hypercholesteremia   . Hypertension   . Hypokalemia   . Hypomagnesemia 01/04/2014  . Liver disease   . MI (myocardial infarction) ([place])   . Opiate use 02/27/2016  . Osteoarthritis     Past Surgical History:  Past Surgical History:  Procedure Laterality Date  . CARDIAC CATHETERIZATION Left 02/22/2016   Procedure: Left Heart Cath and Coronary Angiography;  Surgeon: Yolonda Kida, MD;  Location: [facility];  Service: Cardiovascular;  Laterality: Left;  . COLONOSCOPY WITH PROPOFOL N/A 09/19/2016   Procedure: COLONOSCOPY WITH PROPOFOL;  Surgeon: [name], MD;  Location: [facility] END[facility];  Service: Endoscopy;  Laterality: N/A;  . hemorroids    . NASAL SINUS SURGERY    . ORIF FEMUR FRACTURE    . ORIF TIBIA & FIBULA FRACTURES    . OVARY SURGERY      Gynecologic History: No LMP recorded. Patient is not currently having periods (Reason: Perimenopausal).  Obstetric History: G0P0000  Family History:  Family History  Problem Relation Age of Onset  . Heart disease Mother   . Lung cancer Mother   . Ovarian cancer Mother   . Healthy Brother   . Healthy Brother   . Healthy Brother   . Diabetes Maternal Uncle   . Breast cancer Maternal Aunt     Social History:  Social History   Socioeconomic History  . Marital status: Widowed    Spouse name: Not on file  . Number of children: 0  . Years of education: Not on file  . Highest education level: 9th grade  Social Needs  . Financial resource strain: Hard  . Food insecurity - worry: Often true  . Food insecurity - inability: Often true  . Transportation needs - medical: Yes  . Transportation needs - non-medical: Yes  Occupational History  . Occupation: disabled  Tobacco Use  . Smoking status: Current Every Day Smoker    Packs/day: 0.50    Types: Cigarettes  . Smokeless tobacco: Never Used  Substance and Sexual Activity  . Alcohol use: No    Alcohol/week: 0.0 oz  . Drug use: Yes    Types: Marijuana    Comment: 3 week ago  . Sexual activity: Yes    Birth control/protection: None  Other Topics Concern  . Not on file  Social History Narrative   Moved from [place].    Allergies:  Allergies  Allergen Reactions  . Levofloxacin Hives and Swelling  . Flexeril [Cyclobenzaprine] Hives, Swelling and Other (See Comments)    Reaction:  Facial/lip swelling  Reaction:  Facial/lip swelling   . Keflex [Cephalexin] Hives    . Ketorolac   . Phenergan [Promethazine Hcl] Other (See Comments)    Agitation.   . Toradol [Ketorolac Tromethamine] Swelling and Other (See Comments)    Reaction:  Facial/tongue swelling  Reaction:  Facial/tongue swelling   . Tramadol Hives, Swelling and Other (See Comments)    Reaction:  Lip swelling  Reaction:  Lip swelling   . Zoloft [Sertraline Hcl] Swelling and Other (See Comments)    Reaction:  Tongue swelling  Reaction:  Tongue swelling     Medications: Prior to Admission medications   Medication Sig Start Date End Date Taking? Authorizing Provider  albuterol (PROVENTIL HFA;VENTOLIN HFA) 108 (90 Base) MCG/ACT inhaler Inhale 2 puffs into the lungs every 6 (six) hours as needed for wheezing or shortness of breath. 05/26/17  Yes Carles Collet M, [name]  amLODipine (NORVASC) 5 MG tablet Take 1 tablet (5 mg total) by mouth daily. 05/26/17 05/26/18 Yes [name], [name]  aspirin EC 81 MG tablet Take 81 mg by mouth daily.   Yes [provider]  atorvastatin (LIPITOR) 20 MG tablet Take 20 mg by mouth at bedtime.   Yes [provider]  budesonide-formoterol (SYMBICORT) 160-4.5 MCG/ACT inhaler Inhale 2 puffs into the lungs 2 (two) times daily. 05/26/17  Yes [name], [name]  busPIRone (BUSPAR) 15 MG tablet Take 1 tablet (15 mg total) by mouth 2 (two) times daily. 06/26/17 09/24/17 Yes [name], [name]  docusate sodium (COLACE) 100 MG capsule Take 200 mg by mouth 2 (two) times daily as needed for mild constipation.   Yes [provider]  gabapentin (NEURONTIN) 600 MG tablet Take 1 tablet (600 mg total) by mouth 3 (three) times daily. 06/26/17 09/24/17 Yes [name], [name]  hydrOXYzine (VISTARIL) 25 MG capsule Take 1 capsule (25 mg total) by mouth daily as needed (only for severe anxiety sx.). 08/05/17  Yes Eappen, Saramma, MD  lisinopril (PRINIVIL,ZESTRIL) 40 MG tablet Take 1 tablet (40 mg total) by mouth daily. 08/05/17  Yes Pollak, Adriana M, [name]   MAVYRET 100-40 MG TABS Take 1 capsule by mouth 3 (three) times daily.   Yes [provider]  metoprolol tartrate (LOPRESSOR) 25 MG tablet Take 1 tablet (25 mg total) by mouth 2 (two) times daily. 08/05/17  Yes [name], [name]  nitroGLYCERIN (NITROSTAT) 0.4 MG SL tablet Place 1 tablet (0.4 mg total) under the tongue every 5 (five) minutes x 3 doses as needed for chest pain. 08/03/15  Yes Demetrios Loll, MD  nystatin (MYCOSTATIN/NYSTOP) powder Apply topically 4 (four) times daily. 05/26/17  Yes Carles Collet M, [name]  omeprazole (PRILOSEC) 40 MG capsule Take 1 capsule (40 mg total) daily by mouth. 05/07/17  Yes [name], MD  ondansetron (ZOFRAN-ODT) 8 MG disintegrating tablet Take 1 tablet (8 mg total) by mouth 2 (two) times daily. 07/25/17  Yes [name], [name]  oxyCODONE (OXY IR/ROXICODONE) 5 MG immediate release tablet Take 5 mg by mouth every 4 (four) hours as needed for severe pain.   Yes [provider]  Tiotropium Bromide Monohydrate (SPIRIVA RESPIMAT) 2.5 MCG/ACT AERS Inhale 1 puff into the lungs daily. 05/26/17  Yes [name], [name]  tiZANidine (ZANAFLEX) 2 MG tablet Take by mouth every 6 (six) hours as needed for muscle spasms.   Yes [provider]  venlafaxine XR (EFFEXOR-XR) 37.5 MG 24 hr capsule Take 1 capsule (37.5 mg total) by mouth daily with breakfast. 08/05/17  Yes Eappen, Saramma, MD  zolpidem (AMBIEN) 5 MG tablet Take 1 tablet (5 mg total) by mouth at bedtime as needed for sleep. 08/05/17  Yes Ursula Alert, MD    Physical Exam Blood pressure 118/68, pulse 74, height 5\' 4"  (1.626 m), weight 185 lb (83.9 kg).  General: NAD HEENT: normocephalic, anicteric Pulmonary: No increased work of breathing Abdomen: Soft, non-tender, non-distended.  Umbilicus without lesions.  No hepatomegaly, splenomegaly or masses palpable. No evidence of hernia  Genitourinary:  External: Normal external female genitalia.  Normal urethral meatus, normal  Bartholin's and Skene's glands.    Vagina: Normal vaginal mucosa, no evidence of prolapse.    Cervix: Grossly normal in appearance, no bleeding  Uterus: Non-enlarged, mobile, normal contour.  No CMT  Adnexa: ovaries non-enlarged, no adnexal masses  Rectal: deferred  Lymphatic: no evidence of inguinal lymphadenopathy Extremities: no edema, erythema, or tenderness Neurologic: Grossly intact Psychiatric: mood appropriate, affect full  US Pelvic Complete With Transvaginal  Result Date: 08/07/2017 CLINICAL DATA:  Pelvic pain for 6 months. EXAM: TRANSABDOMINAL AND TRANSVAGINAL ULTRASOUND OF PELVIS TECHNIQUE: Both transabdominal and transvaginal ultrasound examinations of the pelvis were performed. Transabdominal technique was performed for global imaging of the pelvis including uterus, ovaries, adnexal regions, and pelvic cul-de-sac. It was necessary to proceed with endovaginal exam following the transabdominal exam to visualize the endometrium. COMPARISON:  None FINDINGS: Uterus Measurements: 6.7 x 3.3 x 4.8 cm. There is a subserosal anterior fundal fibroid measuring 1.0 x 0.7 x 0.8 cm. Endometrium Thickness: 8 mm.  No focal abnormality visualized. Right ovary Measurements: 3.8 x 2.5 x 2.5 cm. Normal appearance/no adnexal mass. Left ovary Measurements: 2.7 x 1.9 x 1.9 cm. 1.4 cm benign-appearing complicated cyst is noted on the left ovary. Other findings Trace free fluid. IMPRESSION: 1 cm subserosal anterior fundal fibroid, otherwise normal appearance of female pelvis. Electronically Signed   By: Fidela Salisbury M.D.   On: 08/07/2017 15:49     Assessment: 52 y.o. G0P0000 presenting for left ovarian cyst  Plan: Problem List Items Addressed This Visit    None    Visit Diagnoses    Chronic pelvic pain in female    -  Primary   Relevant Medications   oxyCODONE (OXY IR/ROXICODONE) 5 MG immediate release tablet   Other Relevant Orders   Chlamydia/Gonococcus/Trichomonas, NAA   US PELVIS  TRANSVANGINAL NON-OB (TV ONLY)   Screen for STD (sexually transmitted disease)       Relevant Orders   Chlamydia/Gonococcus/Trichomonas, NAA   Left ovarian cyst       Relevant Orders   US PELVIS TRANSVANGINAL NON-OB (TV ONLY)       1) The incidence and implication of adnexal masses and ovarian cysts were discussed with the patient in detail.  Prior imaging if available was reviewed at today's visit..  The vast majority of these lesions will represent benign or physiologic processes and may well resolve on repeat imaging with  expectant management.  We discussed that in a premenopausal patient not on ovulation suppression with via a systemic form hormonal contraception the normal function of the ovary during follicular development is the formation of a dominant follicle or cyst(s) every month.  This is an essential part of normal reproductive physiology.  In some cases these cysts can take on larger dimensions, hemorrhage, or undergo torsion making them symptomatic. Torsion is relatively unlikely for lesions under 5 cm.  Based on initial imaging findings the overall concern for malignancy is deemed low.  We will obtain follow up imagine approximately 6 weeks from the date of the initial imaging study.  Torsion precautions were given.   - Ultrasound reviewed.  The cyst in the left ovary is SIMPLE in appearance.  No specific imaging follow up is recommended according to the Society of Radiologists in Ultrasound 2010 Consensus Conference Statement for simple cysts <3cm incidentally imaged in a reproductive age woman.    Gordy Levan et al. Management of Asymptomatic Ovarian and Other Adnexal Cysts Imaged at [place]: Society of Radiologists in [place] Statement 2010. Radiology 256 (Sept 2010): 943-954.). - given ipsilateral symptoms will obtain follow up ultrasound  2) Tumor makers were not ordered  3) IOTA LR2 score showing a LR of   4) Small 1cm fibroid warrants no further work up.   We discussed 50% of women will develop fibroids, that these are benign slow growing lesion that usually shrink following menopause.  5)  Return in about 4 weeks (around 09/15/2017) for GYN ultrasound follow up.  Malachy Mood, MD, [facility], [facility] 08/19/2017, 5:03 PM

## 2017-08-20 LAB — CHLAMYDIA/GONOCOCCUS/TRICHOMONAS, NAA
Chlamydia by NAA: NEGATIVE
Gonococcus by NAA: NEGATIVE
Trich vag by NAA: POSITIVE — AB

## 2017-08-21 ENCOUNTER — Other Ambulatory Visit: Payer: Self-pay | Admitting: Obstetrics and Gynecology

## 2017-08-21 ENCOUNTER — Encounter: Payer: Self-pay | Admitting: Physical Therapy

## 2017-08-21 ENCOUNTER — Encounter: Payer: Self-pay | Admitting: Obstetrics and Gynecology

## 2017-08-21 ENCOUNTER — Other Ambulatory Visit: Payer: Self-pay | Admitting: Physician Assistant

## 2017-08-21 ENCOUNTER — Ambulatory Visit: Payer: Medicaid Other | Admitting: Physical Therapy

## 2017-08-21 DIAGNOSIS — G8929 Other chronic pain: Secondary | ICD-10-CM

## 2017-08-21 DIAGNOSIS — M5441 Lumbago with sciatica, right side: Secondary | ICD-10-CM | POA: Diagnosis not present

## 2017-08-21 DIAGNOSIS — R110 Nausea: Secondary | ICD-10-CM

## 2017-08-21 DIAGNOSIS — M6281 Muscle weakness (generalized): Secondary | ICD-10-CM

## 2017-08-21 DIAGNOSIS — M5442 Lumbago with sciatica, left side: Principal | ICD-10-CM

## 2017-08-21 DIAGNOSIS — R293 Abnormal posture: Secondary | ICD-10-CM

## 2017-08-21 MED ORDER — ONDANSETRON 8 MG PO TBDP: 8.0000 mg | ORAL_TABLET | Freq: Two times a day (BID) | ORAL | 1 refills | Status: DC

## 2017-08-21 MED ORDER — METRONIDAZOLE 500 MG PO TABS: 2000.0000 mg | ORAL_TABLET | Freq: Once | ORAL | 0 refills | Status: AC

## 2017-08-21 NOTE — Progress Notes (Signed)
Called patient informed of positive trichomonas rx flagyl called in

## 2017-08-21 NOTE — Telephone Encounter (Signed)
Pt contacted office for refill request on the following medications:  ondansetron (ZOFRAN-ODT) 8 MG disintegrating tablet  [facility]  Pt stated that she has used both the Rx that was sent in on 07/25/17 and the refill that was sent with that Rx. The Rx was for a Qty of 20 tablets. Pt stated that she takes this medication every day and that she has to take it every day for the nausea. Please advise. Thanks TNP

## 2017-08-21 NOTE — Therapy (Signed)
[facility] [address] [place], [place], [address] Phone: [phone]   Fax:  [phone]  Physical Therapy Treatment  Patient Details  Name: Sara Munoz MRN: [id number] Date of Birth: 1966-05-30 Referring Provider: [name], [name]   Encounter Date: 08/21/2017  PT End of Session - 08/21/17 1514    Visit Number  2    Number of Visits  4    Date for PT Re-Evaluation  08/27/17    PT Start Time  1448    PT Stop Time  1541    PT Time Calculation (min)  53 min    Activity Tolerance  Patient limited by pain    Behavior During Therapy  [facility] for tasks assessed/performed       Past Medical History:  Diagnosis Date  . Anxiety   . Asthma   . Atypical chest pain 08/02/2015  . CAD (coronary artery disease)   . Chronic back pain   . COPD (chronic obstructive pulmonary disease) ([place])   . Coronary artery disease    a. Mild-non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/2014  . GERD (gastroesophageal reflux disease)   . Hypercholesteremia   . Hypertension   . Hypokalemia   . Hypomagnesemia 01/04/2014  . Liver disease   . MI (myocardial infarction) ([place])   . Opiate use 02/27/2016  . Osteoarthritis     Past Surgical History:  Procedure Laterality Date  . CARDIAC CATHETERIZATION Left 02/22/2016   Procedure: Left Heart Cath and Coronary Angiography;  Surgeon: Yolonda Kida, MD;  Location: [place] [facility];  Service: Cardiovascular;  Laterality: Left;  . COLONOSCOPY WITH PROPOFOL N/A 09/19/2016   Procedure: COLONOSCOPY WITH PROPOFOL;  Surgeon: [name], MD;  Location: [facility] ENDOSCOPY;  Service: Endoscopy;  Laterality: N/A;  . hemorroids    . NASAL SINUS SURGERY    . ORIF FEMUR FRACTURE    . ORIF TIBIA & FIBULA FRACTURES    . OVARY SURGERY      There were no vitals filed for this visit.  Subjective Assessment - 08/21/17 1513    Subjective  Pt. reports 5/10 central LBP currently at rest.  Pt. states her pain in back was >10/10 this  morning prior to taking her medication.      Pertinent History  Chronic LBP since 1990    Limitations  Sitting;Lifting;Standing;Walking;House hold activities    How long can you sit comfortably?  30 minutes    How long can you stand comfortably?  30 minutes    How long can you walk comfortably?  10 minutes    Patient Stated Goals  decrease pain    Currently in Pain?  Yes    Pain Score  5     Pain Location  Back    Pain Orientation  Lower;Mid    Pain Descriptors / Indicators  Burning;Aching;Constant    Pain Type  Chronic pain    Pain Radiating Towards  L radicular symptoms.     Pain Onset  More than a month ago    Pain Frequency  Constant        Treatment:  There.ex.: Scifit L4-5 B UE/LE for 10 min. (warm-up/ no charge).   Supine TrA ex.: initial bracing reinstruct  Manual:  Supine LE/lumbar stretches (focus on piriformis with reproduction of symptoms).   Prone STM/ grade I-II PA mobs. (unilateral) to mid-thoracic/lumbar region (as tolerated).  MH alternated on low back during manual tx.  (poor manual tx. Tolerance secondary  to pain).      PT Long Term Goals - 08/06/17 1956      PT LONG TERM GOAL #1   Title  Pt. will increase FOTO score form 36 to 50 to show a MCID of percieved ability for improved quality of life.    Baseline  FOTO 36    Time  3    Period  Weeks    Status  New    Target Date  08/27/17      PT LONG TERM GOAL #2   Title  Pt. will improve resting pain from current 7/10 NPS to less than 5/10 NPS for two weeks to imporve pt. comfort with sleeping.    Baseline  current pain 7/10 NPS    Time  3    Period  Weeks    Status  New    Target Date  08/27/17      PT LONG TERM GOAL #3   Title  Pt. will demonstrate proper body mechanics with core bracing to pick up objects off floor to reduce risk of increasing pain.    Baseline  pick up jacket from floor with bend and twist and no core brace.    Time  3    Period  Weeks    Status  New    Target Date  08/27/17             Plan - 08/21/17 1515    Clinical Impression Statement  Pt. very pain focused and limited with gentle STM/ low grade mobs. to thoracic/lumbar region in prone position.  Moderate cuing required with core ex. program.  PT emphasized importance of HEP/ daily stretches/ activity.  No change to HEP at this time.  Pt. returns to MD on 08/27/17 prior to next PT appt.  Pt. states she is interested in surgery option.      Clinical Presentation  Evolving    Clinical Decision Making  Moderate    Rehab Potential  Fair    Clinical Impairments Affecting Rehab Potential  chronicity of pain    PT Frequency  1x / week    PT Duration  3 weeks    PT Treatment/Interventions  Biofeedback;Electrical Stimulation;Moist Heat;Ultrasound;Therapeutic exercise;Neuromuscular re-education;Patient/family education;Dry needling;Manual techniques;Aquatic Therapy    PT Next Visit Plan  advance core bracing/ trunk mobility.  Discuss MD appt.     PT Home Exercise Plan  see pt. instructions    Consulted and Agree with Plan of Care  Patient       Patient will benefit from skilled therapeutic intervention in order to improve the following deficits and impairments:  Improper body mechanics, Pain, Decreased mobility, Postural dysfunction, Decreased strength, Hypomobility, Impaired perceived functional ability  Visit Diagnosis: Chronic midline low back pain with bilateral sciatica  Muscle weakness (generalized)  Abnormal posture     Problem List Patient Active Problem List   Diagnosis Date Noted  . Chronic active hepatitis ([place]) 07/29/2017  . Neurogenic pain 07/31/2016  . Chronic pain syndrome 06/05/2016  . Carotid artery stenosis 02/27/2016  . Chronic constipation 02/27/2016  . Chronic nausea 02/27/2016  . Hypercholesterolemia 02/27/2016  . Osteoarthritis 02/27/2016  . PTSD (post-traumatic stress disorder) 02/27/2016  . TIA (transient ischemic attack) 02/27/2016  . Long term current use of opiate  analgesic 02/27/2016  . Long term prescription opiate use 02/27/2016  . Encounter for pain management consult 02/27/2016  . Chronic hip pain (Location of Tertiary source of pain) (Bilateral) (L>R) 02/27/2016  . Chronic knee pain (Bilateral) (  L>R) 02/27/2016  . Chronic shoulder pain (Bilateral) (L>R) 02/27/2016  . Chronic sacroiliac joint pain (Bilateral) (L>R) 02/27/2016  . Chronic low back pain (Location of Primary Source of Pain) (Bilateral) (L>R) 02/27/2016  . Chronic lower extremity pain (Location of Secondary source of pain) (Bilateral) (L>R) 02/27/2016  . Osteoarthritis of hip (Bilateral) (L>R) 02/27/2016  . Chronic neck pain (posterior midline) (Bilateral) (L>R) 02/27/2016  . Cervicogenic headache (Bilateral) (L>R) 02/27/2016  . Occipital headache (Bilateral) (L>R) 02/27/2016  . Chronic upper extremity pain (Bilateral) (L>R) 02/27/2016  . Chronic Cervical radicular pain (Bilateral) (L>R) 02/27/2016  . Chronic lumbar radicular pain (Right) (L5 dermatome) 02/27/2016  . Lumbar facet syndrome (Bilateral) (L>R) 02/27/2016  . Long term prescription benzodiazepine use 02/27/2016  . Hypertension   . COPD (chronic obstructive pulmonary disease) ([place]) 10/09/2015  . GERD (gastroesophageal reflux disease) 10/09/2015  . Non-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/2014 08/02/2015  . Hyperlipidemia 08/02/2015  . Costochondritis 08/02/2015  . 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 GAD (generalized anxiety disorder) 09/03/2014  . Narcotic dependence ([place]) 09/03/2014  . PVD (peripheral vascular disease) ([place]) 09/03/2014  . Smoker 09/03/2014  . Cigarette nicotine dependence with nicotine-induced disorder 07/08/2014  . Anemia of chronic disease 03/19/2014  . Narcotic abuse, continuous ([place]) 03/19/2014  . Polysubstance abuse ([place]) 03/19/2014  . MSSA (methicillin susceptible Staphylococcus aureus) septicemia ([place]) 03/07/2014  . Hypomagnesemia 01/04/2014  . Chronic cough 09/04/2011  . Sleep apnea, obstructive  09/04/2011   Pura Spice, PT, DPT # [phone] 08/21/2017, 8:51 PM   [facility] [address] [place], [place], [address] Phone: [phone]   Fax:  [phone]  Name: Sara Munoz MRN: [id number] Date of Birth: [birth year]

## 2017-08-28 ENCOUNTER — Encounter: Payer: Medicaid Other | Admitting: Physical Therapy

## 2017-08-29 ENCOUNTER — Ambulatory Visit: Payer: Medicaid Other | Admitting: Neurology

## 2017-08-29 ENCOUNTER — Telehealth: Payer: Self-pay | Admitting: Neurology

## 2017-08-29 ENCOUNTER — Encounter: Payer: Self-pay | Admitting: Neurology

## 2017-08-29 VITALS — BP 125/79 | HR 62 | Ht 64.0 in | Wt 186.0 lb

## 2017-08-29 DIAGNOSIS — R51 Headache: Secondary | ICD-10-CM

## 2017-08-29 DIAGNOSIS — R202 Paresthesia of skin: Secondary | ICD-10-CM

## 2017-08-29 DIAGNOSIS — M542 Cervicalgia: Secondary | ICD-10-CM | POA: Insufficient documentation

## 2017-08-29 DIAGNOSIS — R519 Headache, unspecified: Secondary | ICD-10-CM

## 2017-08-29 MED ORDER — ALPRAZOLAM 1 MG PO TABS: 1.0000 mg | ORAL_TABLET | Freq: Every evening | ORAL | 0 refills | Status: DC | PRN

## 2017-08-29 NOTE — Telephone Encounter (Signed)
She prefer MRI at [facility].

## 2017-08-29 NOTE — Progress Notes (Signed)
PATIENT: Sara Munoz DOB: Oct 23, 1965  Chief Complaint  Patient presents with  . New Patient (Initial Visit)    pt alone, she has been having bad headaches, turns her neck and she has numbness and tingling that runs down hands and feet. she has had episodes of blacking out and numbness in face.      HISTORICAL  Sara Munoz is a 52 year old female, seen in refer by primary care PA Carles Collet for evaluation of paresthesia, persistent headaches, initial evaluation was on August 29, 2017.  I reviewed and summarized the referring note, she had hypertension, hyperlipidemia, COPD, reported coronary artery disease, heart attack, stroke in the past, hepatitis C, is receiving treatment, will finish in March 2019, she has been on disability.  On polypharmacy treatment.  Since summer 2018, she reported gradual onset worsening neck pain, radiating paresthesia along her spine and 4 extremity with neck movement, gradually getting worse over the past few months, also noticed spreading of neck pain to her vortex area, almost daily pressure headaches, she did have a history of migraine in the past, but this headaches are persistent, moderate degree on a daily basis.  She has tried aspirin, NSAIDs with limited help, she denies bowel and bladder incontinence, no significant gait abnormality.  She reported few episode of passing out since 2018, most recent one in January 2019, woke up in the middle of the night using bathroom, no warning signs, woke up on the floor, with bruising her face, whole body achy pain, urinary incontinence.  Reviewed CT head without contrast November 2018, no acute abnormality, CT cervical spine showed multilevel degenerative disc disease, with mild to moderate canal stenosis C6-C7.  Laboratory evaluation seen November 2018, normal CBC, BMP.  REVIEW OF SYSTEMS: Full 14 system review of systems performed and notable only for as above mildly dense dependent decreased light  touch,  ALLERGIES: Allergies  Allergen Reactions  . Levofloxacin Hives and Swelling  . Flexeril [Cyclobenzaprine] Hives, Swelling and Other (See Comments)    Reaction:  Facial/lip swelling  Reaction:  Facial/lip swelling   . Keflex [Cephalexin] Hives  . Ketorolac   . Phenergan [Promethazine Hcl] Other (See Comments)    Agitation.   . Toradol [Ketorolac Tromethamine] Swelling and Other (See Comments)    Reaction:  Facial/tongue swelling  Reaction:  Facial/tongue swelling   . Tramadol Hives, Swelling and Other (See Comments)    Reaction:  Lip swelling  Reaction:  Lip swelling   . Zoloft [Sertraline Hcl] Swelling and Other (See Comments)    Reaction:  Tongue swelling  Reaction:  Tongue swelling     HOME MEDICATIONS: Current Outpatient Medications  Medication Sig Dispense Refill  . albuterol (PROVENTIL HFA;VENTOLIN HFA) 108 (90 Base) MCG/ACT inhaler Inhale 2 puffs into the lungs every 6 (six) hours as needed for wheezing or shortness of breath. 1 Inhaler 2  . amLODipine (NORVASC) 5 MG tablet Take 1 tablet (5 mg total) by mouth daily. 30 tablet 0  . aspirin EC 81 MG tablet Take 81 mg by mouth daily.    Marland Kitchen atorvastatin (LIPITOR) 20 MG tablet Take 20 mg by mouth at bedtime.    . budesonide-formoterol (SYMBICORT) 160-4.5 MCG/ACT inhaler Inhale 2 puffs into the lungs 2 (two) times daily. 1 Inhaler 0  . busPIRone (BUSPAR) 15 MG tablet Take 1 tablet (15 mg total) by mouth 2 (two) times daily. 60 tablet 2  . docusate sodium (COLACE) 100 MG capsule Take 200 mg by mouth 2 (two)  times daily as needed for mild constipation.    . gabapentin (NEURONTIN) 600 MG tablet Take 1 tablet (600 mg total) by mouth 3 (three) times daily. 90 tablet 2  . hydrOXYzine (ATARAX/VISTARIL) 25 MG tablet Take by mouth.    . hydrOXYzine (VISTARIL) 25 MG capsule Take 1 capsule (25 mg total) by mouth daily as needed (only for severe anxiety sx.). 30 capsule 1  . lisinopril (PRINIVIL,ZESTRIL) 40 MG tablet Take 1 tablet (40  mg total) by mouth daily. 90 tablet 1  . MAVYRET 100-40 MG TABS Take 1 capsule by mouth 3 (three) times daily.    . metoprolol tartrate (LOPRESSOR) 25 MG tablet Take 1 tablet (25 mg total) by mouth 2 (two) times daily. 180 tablet 1  . Naloxone HCl (NARCAN IJ) Inject as directed as needed.    . nitroGLYCERIN (NITROSTAT) 0.4 MG SL tablet Place 1 tablet (0.4 mg total) under the tongue every 5 (five) minutes x 3 doses as needed for chest pain. 30 tablet 2  . nystatin (MYCOSTATIN/NYSTOP) powder Apply topically 4 (four) times daily. 15 g 0  . omeprazole (PRILOSEC) 40 MG capsule Take 1 capsule (40 mg total) daily by mouth. 90 capsule 3  . ondansetron (ZOFRAN-ODT) 8 MG disintegrating tablet Take 1 tablet (8 mg total) by mouth 2 (two) times daily. 20 tablet 1  . oxyCODONE (OXY IR/ROXICODONE) 5 MG immediate release tablet Take 5 mg by mouth every 4 (four) hours as needed for severe pain.    . ranolazine (RANEXA) 500 MG 12 hr tablet Take 500 mg by mouth 2 (two) times daily.    . Tiotropium Bromide Monohydrate (SPIRIVA RESPIMAT) 2.5 MCG/ACT AERS Inhale 1 puff into the lungs daily. 4 g 2  . tiZANidine (ZANAFLEX) 2 MG tablet Take by mouth every 6 (six) hours as needed for muscle spasms.    Marland Kitchen venlafaxine XR (EFFEXOR-XR) 37.5 MG 24 hr capsule Take 1 capsule (37.5 mg total) by mouth daily with breakfast. 30 capsule 1  . zolpidem (AMBIEN) 5 MG tablet Take 1 tablet (5 mg total) by mouth at bedtime as needed for sleep. 30 tablet 1   No current facility-administered medications for this visit.     PAST MEDICAL HISTORY: Past Medical History:  Diagnosis Date  . Anxiety   . Asthma   . Atypical chest pain 08/02/2015  . CAD (coronary artery disease)   . Chronic back pain   . COPD (chronic obstructive pulmonary disease) ([place])   . Coronary artery disease    a. Mild-non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/2014  . GERD (gastroesophageal reflux disease)   . Hypercholesteremia   . Hypertension   . Hypokalemia   .  Hypomagnesemia 01/04/2014  . Liver disease   . MI (myocardial infarction) ([place])   . Opiate use 02/27/2016  . Osteoarthritis     PAST SURGICAL HISTORY: Past Surgical History:  Procedure Laterality Date  . CARDIAC CATHETERIZATION Left 02/22/2016   Procedure: Left Heart Cath and Coronary Angiography;  Surgeon: Yolonda Kida, MD;  Location: [place] [facility];  Service: Cardiovascular;  Laterality: Left;  . COLONOSCOPY WITH PROPOFOL N/A 09/19/2016   Procedure: COLONOSCOPY WITH PROPOFOL;  Surgeon: [name], MD;  Location: [facility] ENDOSCOPY;  Service: Endoscopy;  Laterality: N/A;  . hemorroids    . NASAL SINUS SURGERY    . ORIF FEMUR FRACTURE    . ORIF TIBIA & FIBULA FRACTURES    . OVARY SURGERY      FAMILY HISTORY: Family History  Problem  Relation Age of Onset  . Heart disease Mother   . Lung cancer Mother   . Ovarian cancer Mother   . Healthy Brother   . Healthy Brother   . Healthy Brother   . Diabetes Maternal Uncle   . Breast cancer Maternal Aunt     SOCIAL HISTORY:  Social History   Socioeconomic History  . Marital status: Widowed    Spouse name: Not on file  . Number of children: 0  . Years of education: Not on file  . Highest education level: 9th grade  Social Needs  . Financial resource strain: Hard  . Food insecurity - worry: Often true  . Food insecurity - inability: Often true  . Transportation needs - medical: Yes  . Transportation needs - non-medical: Yes  Occupational History  . Occupation: disabled  Tobacco Use  . Smoking status: Current Every Day Smoker    Packs/day: 0.50    Types: Cigarettes  . Smokeless tobacco: Never Used  Substance and Sexual Activity  . Alcohol use: No    Alcohol/week: 0.0 oz  . Drug use: Yes    Types: Marijuana    Comment: 3 week ago  . Sexual activity: Yes    Birth control/protection: None  Other Topics Concern  . Not on file  Social History Narrative   Moved from [place].     PHYSICAL EXAM   Vitals:    08/29/17 0827  BP: 125/79  Pulse: 62  Weight: 186 lb (84.4 kg)  Height: 5\' 4"  (1.626 m)    Not recorded      Body mass index is 31.93 kg/m.  PHYSICAL EXAMNIATION:  Gen: NAD, conversant, well nourised, obese, well groomed                     Cardiovascular: Regular rate rhythm, no peripheral edema, warm, nontender. Eyes: Conjunctivae clear without exudates or hemorrhage Neck: Supple, no carotid bruits. Pulmonary: Clear to auscultation bilaterally   NEUROLOGICAL EXAM:  MENTAL STATUS: Speech:    Speech is normal; fluent and spontaneous with normal comprehension.  Cognition:     Orientation to time, place and person     Normal recent and remote memory     Normal Attention span and concentration     Normal Language, naming, repeating,spontaneous speech     Fund of knowledge   CRANIAL NERVES: CN II: Visual fields are full to confrontation. Fundoscopic exam is normal with sharp discs and no vascular changes. Pupils are round equal and briskly reactive to light. CN III, IV, VI: extraocular movement are normal. No ptosis. CN V: Facial sensation is intact to pinprick in all 3 divisions bilaterally. Corneal responses are intact.  CN VII: Face is symmetric with normal eye closure and smile. CN VIII: Hearing is normal to rubbing fingers CN IX, X: Palate elevates symmetrically. Phonation is normal. CN XI: Head turning and shoulder shrug are intact CN XII: Tongue is midline with normal movements and no atrophy.  MOTOR: There is no pronator drift of out-stretched arms. Muscle bulk and tone are normal. Muscle strength is normal.  REFLEXES: Reflexes are 2+ and symmetric at the biceps, triceps, knees, and ankles. Plantar responses are flexor.  SENSORY: Mildly length dependent decreased to light touch, pinprick, vibratory sensations.  COORDINATION: Rapid alternating movements and fine finger movements are intact. There is no dysmetria on finger-to-nose and heel-knee-shin.     GAIT/STANCE: Posture is normal. Gait is steady with normal steps, base, arm swing, and turning. Heel  and toe walking are normal. Tandem gait is normal.  Romberg is absent.   DIAGNOSTIC DATA (LABS, IMAGING, TESTING) - I reviewed patient records, labs, notes, testing and imaging myself where available.   ASSESSMENT AND PLAN  Sara Munoz is a 52 y.o. female   Paresthesia of 4 extremity, Persistent daily headaches,  MRI of cervical, and the brain to rule out structural lesion,  She is on polypharmacy treatment already, including Effexor,Gabapentin  EMG nerve conduction study   [name], M.D. Ph.D.  [facility] [address] [place], [place] [address] Ph: [phone] Fax: [phone]  CC: [name], [name]

## 2017-09-01 ENCOUNTER — Telehealth: Payer: Self-pay | Admitting: Neurology

## 2017-09-01 NOTE — Telephone Encounter (Signed)
Noted, I faxed clinical notes for the authorization.

## 2017-09-01 NOTE — Telephone Encounter (Signed)
Scratch that patient wants to have it at [facility].

## 2017-09-01 NOTE — Telephone Encounter (Signed)
Mri's were sent to [facility]. She has medicaid they obtain the authorization.

## 2017-09-02 NOTE — Telephone Encounter (Signed)
I checked the status on the case number it is still pending.

## 2017-09-03 ENCOUNTER — Ambulatory Visit: Payer: Medicaid Other | Admitting: Gastroenterology

## 2017-09-03 NOTE — Telephone Encounter (Signed)
Medicaid Josem Kaufmann: [id number] (exp. 09/01/17 to 10/01/17) Please call cone at [phone] to schedule her MRI's at [facility]. Then call the patient to let her know when it has been schedule.

## 2017-09-03 NOTE — Telephone Encounter (Signed)
Patient has been scheduled for March 21st at 7:00 pm. I called patient and left her a VM making her aware.

## 2017-09-11 ENCOUNTER — Ambulatory Visit
Admission: RE | Admit: 2017-09-11 | Discharge: 2017-09-11 | Disposition: A | Discharge status: Home / Self Care | Payer: Medicaid Other | Source: Ambulatory Visit | Attending: Neurology | Admitting: Neurology

## 2017-09-11 DIAGNOSIS — R202 Paresthesia of skin: Secondary | ICD-10-CM | POA: Diagnosis present

## 2017-09-11 DIAGNOSIS — E236 Other disorders of pituitary gland: Secondary | ICD-10-CM | POA: Diagnosis not present

## 2017-09-11 DIAGNOSIS — M542 Cervicalgia: Secondary | ICD-10-CM | POA: Diagnosis present

## 2017-09-11 DIAGNOSIS — R51 Headache: Secondary | ICD-10-CM | POA: Insufficient documentation

## 2017-09-11 DIAGNOSIS — M50223 Other cervical disc displacement at C6-C7 level: Secondary | ICD-10-CM | POA: Insufficient documentation

## 2017-09-11 DIAGNOSIS — R519 Headache, unspecified: Secondary | ICD-10-CM

## 2017-09-11 DIAGNOSIS — M4802 Spinal stenosis, cervical region: Secondary | ICD-10-CM | POA: Diagnosis not present

## 2017-09-12 ENCOUNTER — Telehealth: Payer: Self-pay | Admitting: Neurology

## 2017-09-12 NOTE — Telephone Encounter (Signed)
Left message requesting a return call.

## 2017-09-12 NOTE — Telephone Encounter (Signed)
Please call patient, MRI of the brain showed no significant intracranial abnormality MRI of cervical spine showed evidence of C6-7 moderate stenosis, cord flattening, with bilateral foraminal narrowing, at C5-6, 6 7 level.  I will review MRI films with her at next follow-up visit   IMPRESSION: No acute intracranial abnormality. Partial empty sella, uncertain significance. Otherwise unremarkable MRI of the brain.  Central disc extrusion at C6-7 with moderate stenosis and cord flattening. Canal diameter 6 mm. No abnormal cord signal, but LEFT greater than RIGHT C7 foraminal narrowing is noted.  Central and rightward protrusion at C5-6 with mild stenosis and cord flattening. RIGHT C6 foraminal narrowing is seen.   Electronically Signed

## 2017-09-15 ENCOUNTER — Other Ambulatory Visit: Payer: Medicaid Other

## 2017-09-15 ENCOUNTER — Ambulatory Visit: Payer: Medicaid Other | Admitting: Obstetrics and Gynecology

## 2017-09-15 NOTE — Telephone Encounter (Signed)
Spoke to patient - she is aware of results.  She will keep her pending appt on 10/01/17 for EMG/NCV for further evaluation.

## 2017-09-16 ENCOUNTER — Ambulatory Visit: Payer: Medicaid Other | Admitting: Psychiatry

## 2017-09-17 ENCOUNTER — Encounter: Payer: Self-pay | Admitting: Gastroenterology

## 2017-09-17 ENCOUNTER — Ambulatory Visit: Payer: Medicaid Other | Admitting: Gastroenterology

## 2017-09-17 ENCOUNTER — Encounter: Payer: Self-pay | Admitting: *Deleted

## 2017-09-17 VITALS — BP 112/76 | HR 66 | Ht 64.0 in | Wt 180.6 lb

## 2017-09-17 DIAGNOSIS — R109 Unspecified abdominal pain: Secondary | ICD-10-CM

## 2017-09-17 DIAGNOSIS — B1920 Unspecified viral hepatitis C without hepatic coma: Secondary | ICD-10-CM | POA: Diagnosis not present

## 2017-09-17 MED ORDER — SUCRALFATE 1 GM/10ML PO SUSP: 1.0000 g | Freq: Four times a day (QID) | ORAL | 1 refills | Status: DC

## 2017-09-17 NOTE — Progress Notes (Signed)
Patient given EGD instructions.   Contacted ENDO for stat add-on procedure.

## 2017-09-17 NOTE — Progress Notes (Signed)
Sara Bellows MD, MRCP(U.K) [address]  [place]  [place], [place] [address]  Main: [phone]  Fax: [phone]   Primary Care Physician: Sara Post, [name]  Primary Gastroenterologist:  Dr. Jonathon Munoz   No chief complaint on file.   HPI: Sara Munoz is a 52 y.o. female  Summary of history : Sara R Dunlapwas initially referred back in 05/2016 for hepatitis C GT 3 .Treatment naive. She said that her step brother had hepatitis C. She says she was a "big drinker" in the past and consumed a 12 pack twice a week for about "all my life".  At her last visit she continued to consume 1 case of alcohol a week . She denies any tatoos., no incarceration or Armed forces logistics/support/administrative officer. She used heroine last July , non since. Last used cocaine last year. No Armed forces logistics/support/administrative officer. Denies use of any herbal medications. Autoimmune screen,HIV, Hep B serology -negative in 08/2016   CT abdomen 03/17/17- increased stool burden and hepatic steatosis.  Colonoscopy 08/2016 - small adenomas excised  RUQ USG 05/07/16 showed hepatic steatosis.   Labs 12/2016- Albumin 4.1,Hb 14.1 and platelet count 213  Interval history 05/07/17 -09/17/17    Completed hepatitis C treatment 1 week back .   No constipation- softer stools, stopped linzess. Not yet stopped smoking . Epigastric pain since last 2 months, all day long , no change with food. Bloated , abdominal distension +, No artificial sugars. No sodas. Taking prilosec.   Current Outpatient Medications  Medication Sig Dispense Refill  . albuterol (PROVENTIL HFA;VENTOLIN HFA) 108 (90 Base) MCG/ACT inhaler Inhale 2 puffs into the lungs every 6 (six) hours as needed for wheezing or shortness of breath. 1 Inhaler 2  . ALPRAZolam (XANAX) 1 MG tablet Take 1 tablet (1 mg total) by mouth at bedtime as needed for anxiety. 3 tablet 0  . amLODipine (NORVASC) 5 MG tablet Take 1 tablet (5 mg total) by mouth daily. 30 tablet 0  . aspirin EC 81 MG tablet Take 81 mg by  mouth daily.    Marland Kitchen atorvastatin (LIPITOR) 20 MG tablet Take 20 mg by mouth at bedtime.    . budesonide-formoterol (SYMBICORT) 160-4.5 MCG/ACT inhaler Inhale 2 puffs into the lungs 2 (two) times daily. 1 Inhaler 0  . busPIRone (BUSPAR) 15 MG tablet Take 1 tablet (15 mg total) by mouth 2 (two) times daily. 60 tablet 2  . docusate sodium (COLACE) 100 MG capsule Take 200 mg by mouth 2 (two) times daily as needed for mild constipation.    . gabapentin (NEURONTIN) 600 MG tablet Take 1 tablet (600 mg total) by mouth 3 (three) times daily. 90 tablet 2  . hydrOXYzine (ATARAX/VISTARIL) 25 MG tablet Take by mouth.    . hydrOXYzine (VISTARIL) 25 MG capsule Take 1 capsule (25 mg total) by mouth daily as needed (only for severe anxiety sx.). 30 capsule 1  . lisinopril (PRINIVIL,ZESTRIL) 40 MG tablet Take 1 tablet (40 mg total) by mouth daily. 90 tablet 1  . MAVYRET 100-40 MG TABS Take 1 capsule by mouth 3 (three) times daily.    . metoprolol tartrate (LOPRESSOR) 25 MG tablet Take 1 tablet (25 mg total) by mouth 2 (two) times daily. 180 tablet 1  . Naloxone HCl (NARCAN IJ) Inject as directed as needed.    . nitroGLYCERIN (NITROSTAT) 0.4 MG SL tablet Place 1 tablet (0.4 mg total) under the tongue every 5 (five) minutes x 3 doses as needed for chest pain. 30 tablet  2  . nystatin (MYCOSTATIN/NYSTOP) powder Apply topically 4 (four) times daily. 15 g 0  . omeprazole (PRILOSEC) 40 MG capsule Take 1 capsule (40 mg total) daily by mouth. 90 capsule 3  . ondansetron (ZOFRAN-ODT) 8 MG disintegrating tablet Take 1 tablet (8 mg total) by mouth 2 (two) times daily. 20 tablet 1  . oxyCODONE (OXY IR/ROXICODONE) 5 MG immediate release tablet Take 5 mg by mouth every 4 (four) hours as needed for severe pain.    . ranolazine (RANEXA) 500 MG 12 hr tablet Take 500 mg by mouth 2 (two) times daily.    . Tiotropium Bromide Monohydrate (SPIRIVA RESPIMAT) 2.5 MCG/ACT AERS Inhale 1 puff into the lungs daily. 4 g 2  . tiZANidine (ZANAFLEX)  2 MG tablet Take by mouth every 6 (six) hours as needed for muscle spasms.    Marland Kitchen venlafaxine XR (EFFEXOR-XR) 37.5 MG 24 hr capsule Take 1 capsule (37.5 mg total) by mouth daily with breakfast. 30 capsule 1  . zolpidem (AMBIEN) 5 MG tablet Take 1 tablet (5 mg total) by mouth at bedtime as needed for sleep. 30 tablet 1   No current facility-administered medications for this visit.     Allergies as of 09/17/2017 - Review Complete 08/29/2017  Allergen Reaction Noted  . Levofloxacin Hives and Swelling 02/27/2012  . Flexeril [cyclobenzaprine] Hives, Swelling, and Other (See Comments) 11/13/2013  . Keflex [cephalexin] Hives 10/22/2010  . Ketorolac  10/22/2010  . Phenergan [promethazine hcl] Other (See Comments) 02/21/2017  . Toradol [ketorolac tromethamine] Swelling and Other (See Comments) 01/17/2015  . Tramadol Hives, Swelling, and Other (See Comments) 01/17/2015  . Zoloft [sertraline hcl] Swelling and Other (See Comments) 05/31/2015    ROS:  General: Negative for anorexia, weight loss, fever, chills, fatigue, weakness. ENT: Negative for hoarseness, difficulty swallowing , nasal congestion. CV: Negative for chest pain, angina, palpitations, dyspnea on exertion, peripheral edema.  Respiratory: Negative for dyspnea at rest, dyspnea on exertion, cough, sputum, wheezing.  GI: See history of present illness. GU:  Negative for dysuria, hematuria, urinary incontinence, urinary frequency, nocturnal urination.  Endo: Negative for unusual weight change.    Physical Examination:   There were no vitals taken for this visit.  General: Well-nourished, well-developed in no acute distress.  Eyes: No icterus. Conjunctivae pink. Mouth: Oropharyngeal mucosa moist and pink , no lesions erythema or exudate. Lungs: Clear to auscultation bilaterally. Non-labored. Heart: Regular rate and rhythm, no murmurs rubs or gallops.  Abdomen: Bowel sounds are normal, nontender, nondistended, no hepatosplenomegaly or  masses, no abdominal bruits or hernia , no rebound or guarding.   Extremities: No lower extremity edema. No clubbing or deformities. Neuro: Alert and oriented x 3.  Grossly intact. Skin: Warm and dry, no jaundice.   Psych: Alert and cooperative, normal mood and affect.   Imaging Studies: Mr Brain Wo Contrast  Result Date: 09/11/2017 CLINICAL DATA:  Neck pain. BILATERAL arm and leg pain. Numbness and tingling for 1 year. EXAM: MRI HEAD WITHOUT CONTRAST MRI CERVICAL SPINE WITHOUT CONTRAST TECHNIQUE: Multiplanar, multiecho pulse sequences of the brain and surrounding structures, and cervical spine, to include the craniocervical junction and cervicothoracic junction, were obtained without intravenous contrast. COMPARISON:  CT head and cervical spine 05/13/2017. FINDINGS: MRI HEAD FINDINGS Brain: No evidence for acute infarction, hemorrhage, mass lesion, hydrocephalus, or extra-axial fluid. Normal cerebral volume. No white matter disease. Vascular: Normal flow voids. Skull and upper cervical spine: Normal marrow signal. Partial empty sella. Sinuses/Orbits: Negative. Other: None. MRI CERVICAL SPINE FINDINGS Alignment:  Straightening of the normal cervical lordosis. No subluxation. Vertebrae: No fracture, evidence of discitis, or bone lesion. Cord: There is significant cord compression at C6-7, and mild cord compression at C5-6. No definite abnormal cord signal. Posterior Fossa, vertebral arteries, paraspinal tissues: Unremarkable. Disc levels: C2-3: Normal. C3-4:  Normal. C4-5:  Normal. C5-6: Disc space narrowing with osseous spurring. Central and rightward protrusion. Mild stenosis and cord flattening. Canal diameter 7 mm. RIGHT C6 foraminal narrowing. C6-7: Disc space narrowing with osseous spurring. Central disc extrusion. Moderate cord flattening. Moderate stenosis with canal diameter approximately 6 mm. LEFT greater than RIGHT C7 foraminal narrowing. C7-T1:  Unremarkable disc space.  No impingement.  IMPRESSION: No acute intracranial abnormality. Partial empty sella, uncertain significance. Otherwise unremarkable MRI of the brain. Central disc extrusion at C6-7 with moderate stenosis and cord flattening. Canal diameter 6 mm. No abnormal cord signal, but LEFT greater than RIGHT C7 foraminal narrowing is noted. Central and rightward protrusion at C5-6 with mild stenosis and cord flattening. RIGHT C6 foraminal narrowing is seen. Electronically Signed   By: Staci Righter M.D.   On: 09/11/2017 21:33   Mr Cervical Spine Wo Contrast  Result Date: 09/11/2017 CLINICAL DATA:  Neck pain. BILATERAL arm and leg pain. Numbness and tingling for 1 year. EXAM: MRI HEAD WITHOUT CONTRAST MRI CERVICAL SPINE WITHOUT CONTRAST TECHNIQUE: Multiplanar, multiecho pulse sequences of the brain and surrounding structures, and cervical spine, to include the craniocervical junction and cervicothoracic junction, were obtained without intravenous contrast. COMPARISON:  CT head and cervical spine 05/13/2017. FINDINGS: MRI HEAD FINDINGS Brain: No evidence for acute infarction, hemorrhage, mass lesion, hydrocephalus, or extra-axial fluid. Normal cerebral volume. No white matter disease. Vascular: Normal flow voids. Skull and upper cervical spine: Normal marrow signal. Partial empty sella. Sinuses/Orbits: Negative. Other: None. MRI CERVICAL SPINE FINDINGS Alignment: Straightening of the normal cervical lordosis. No subluxation. Vertebrae: No fracture, evidence of discitis, or bone lesion. Cord: There is significant cord compression at C6-7, and mild cord compression at C5-6. No definite abnormal cord signal. Posterior Fossa, vertebral arteries, paraspinal tissues: Unremarkable. Disc levels: C2-3: Normal. C3-4:  Normal. C4-5:  Normal. C5-6: Disc space narrowing with osseous spurring. Central and rightward protrusion. Mild stenosis and cord flattening. Canal diameter 7 mm. RIGHT C6 foraminal narrowing. C6-7: Disc space narrowing with osseous  spurring. Central disc extrusion. Moderate cord flattening. Moderate stenosis with canal diameter approximately 6 mm. LEFT greater than RIGHT C7 foraminal narrowing. C7-T1:  Unremarkable disc space.  No impingement. IMPRESSION: No acute intracranial abnormality. Partial empty sella, uncertain significance. Otherwise unremarkable MRI of the brain. Central disc extrusion at C6-7 with moderate stenosis and cord flattening. Canal diameter 6 mm. No abnormal cord signal, but LEFT greater than RIGHT C7 foraminal narrowing is noted. Central and rightward protrusion at C5-6 with mild stenosis and cord flattening. RIGHT C6 foraminal narrowing is seen. Electronically Signed   By: Staci Righter M.D.   On: 09/11/2017 21:33    Assessment and Plan:   SONNIA STRONG is a 52 y.o. y/o female  here to follow upfor hepatitis C. GT 3 No biochemical or radiological evidence of cirrhosis. Quit all alochol 6 months back. Completed treatment . Got 1/3 doses left of her Hep A/B vaccine .Main issue is epigastric pain today which I feel is related to aerophagia from smoking.    Plan  1. EGD  2. Still smoking - advised to stop  3. Check Hep C pcr.  4. Continue PPI and carafate for heartburn   I  have discussed alternative options, risks & benefits,  which include, but are not limited to, bleeding, infection, perforation,respiratory complication & drug reaction.  The patient agrees with this plan & written consent will be obtained.     Dr Sara Bellows  MD,MRCP [facility]) Follow up in 12 weeks

## 2017-09-18 ENCOUNTER — Ambulatory Visit
Admission: RE | Admit: 2017-09-18 | Discharge: 2017-09-18 | Disposition: A | Discharge status: Home / Self Care | Payer: Medicaid Other | Source: Ambulatory Visit | Attending: Gastroenterology | Admitting: Gastroenterology

## 2017-09-18 ENCOUNTER — Ambulatory Visit: Payer: Medicaid Other | Admitting: Anesthesiology

## 2017-09-18 ENCOUNTER — Other Ambulatory Visit: Payer: Self-pay

## 2017-09-18 ENCOUNTER — Encounter: Payer: Self-pay | Admitting: Anesthesiology

## 2017-09-18 ENCOUNTER — Encounter
Admission: RE | Disposition: A | Discharge status: Home / Self Care | Payer: Self-pay | Source: Ambulatory Visit | Attending: Gastroenterology

## 2017-09-18 DIAGNOSIS — I252 Old myocardial infarction: Secondary | ICD-10-CM | POA: Insufficient documentation

## 2017-09-18 DIAGNOSIS — F17210 Nicotine dependence, cigarettes, uncomplicated: Secondary | ICD-10-CM | POA: Diagnosis not present

## 2017-09-18 DIAGNOSIS — K2970 Gastritis, unspecified, without bleeding: Secondary | ICD-10-CM | POA: Insufficient documentation

## 2017-09-18 DIAGNOSIS — Z79899 Other long term (current) drug therapy: Secondary | ICD-10-CM | POA: Insufficient documentation

## 2017-09-18 DIAGNOSIS — F419 Anxiety disorder, unspecified: Secondary | ICD-10-CM | POA: Insufficient documentation

## 2017-09-18 DIAGNOSIS — G4730 Sleep apnea, unspecified: Secondary | ICD-10-CM | POA: Insufficient documentation

## 2017-09-18 DIAGNOSIS — I2510 Atherosclerotic heart disease of native coronary artery without angina pectoris: Secondary | ICD-10-CM | POA: Diagnosis not present

## 2017-09-18 DIAGNOSIS — Z8673 Personal history of transient ischemic attack (TIA), and cerebral infarction without residual deficits: Secondary | ICD-10-CM | POA: Diagnosis not present

## 2017-09-18 DIAGNOSIS — Z7951 Long term (current) use of inhaled steroids: Secondary | ICD-10-CM | POA: Diagnosis not present

## 2017-09-18 DIAGNOSIS — I739 Peripheral vascular disease, unspecified: Secondary | ICD-10-CM | POA: Insufficient documentation

## 2017-09-18 DIAGNOSIS — I10 Essential (primary) hypertension: Secondary | ICD-10-CM | POA: Insufficient documentation

## 2017-09-18 DIAGNOSIS — R109 Unspecified abdominal pain: Secondary | ICD-10-CM | POA: Insufficient documentation

## 2017-09-18 DIAGNOSIS — J449 Chronic obstructive pulmonary disease, unspecified: Secondary | ICD-10-CM | POA: Diagnosis not present

## 2017-09-18 DIAGNOSIS — K219 Gastro-esophageal reflux disease without esophagitis: Secondary | ICD-10-CM | POA: Diagnosis not present

## 2017-09-18 DIAGNOSIS — Z7982 Long term (current) use of aspirin: Secondary | ICD-10-CM | POA: Insufficient documentation

## 2017-09-18 DIAGNOSIS — E7800 Pure hypercholesterolemia, unspecified: Secondary | ICD-10-CM | POA: Diagnosis not present

## 2017-09-18 HISTORY — PX: ESOPHAGOGASTRODUODENOSCOPY (EGD) WITH PROPOFOL: SHX5813

## 2017-09-18 HISTORY — DX: Cerebral infarction, unspecified: I63.9

## 2017-09-18 SURGERY — ESOPHAGOGASTRODUODENOSCOPY (EGD) WITH PROPOFOL
Anesthesia: General

## 2017-09-18 MED ORDER — PROPOFOL 10 MG/ML IV BOLUS
INTRAVENOUS | Status: DC | PRN
  Administered 2017-09-18: 100 mg via INTRAVENOUS

## 2017-09-18 MED ORDER — FENTANYL CITRATE (PF) 100 MCG/2ML IJ SOLN
INTRAMUSCULAR | Status: DC | PRN
  Administered 2017-09-18: 50 ug via INTRAVENOUS

## 2017-09-18 MED ORDER — SODIUM CHLORIDE 0.9 % IV SOLN
INTRAVENOUS | Status: DC
  Administered 2017-09-18: 11:00:00 via INTRAVENOUS

## 2017-09-18 MED ORDER — FENTANYL CITRATE (PF) 100 MCG/2ML IJ SOLN
INTRAMUSCULAR | Status: AC
  Filled 2017-09-18: qty 2

## 2017-09-18 MED ORDER — LIDOCAINE HCL (CARDIAC) 20 MG/ML IV SOLN
INTRAVENOUS | Status: DC | PRN
  Administered 2017-09-18: 100 mg via INTRAVENOUS

## 2017-09-18 MED ORDER — PROPOFOL 10 MG/ML IV BOLUS
INTRAVENOUS | Status: AC
  Filled 2017-09-18: qty 20

## 2017-09-18 NOTE — Transfer of Care (Signed)
Immediate Anesthesia Transfer of Care Note  Patient: Sara Munoz  Procedure(s) Performed: ESOPHAGOGASTRODUODENOSCOPY (EGD) WITH PROPOFOL (N/A )  Patient Location: PACU  Anesthesia Type:General  Level of Consciousness: awake, alert  and oriented  Airway & Oxygen Therapy: Patient Spontanous Breathing and Patient connected to nasal cannula oxygen  Post-op Assessment: Report given to RN and Post -op Vital signs reviewed and stable  Post vital signs: Reviewed and stable  Last Vitals:  Vitals Value Taken Time  BP 105/68 09/18/2017 12:24 PM  Temp    Pulse 73 09/18/2017 12:25 PM  Resp 17 09/18/2017 12:25 PM  SpO2 98 % 09/18/2017 12:25 PM  Vitals shown include unvalidated device data.  Last Pain:  Vitals:   09/18/17 1039  TempSrc: Tympanic  PainSc: 0-No pain         Complications: No apparent anesthesia complications

## 2017-09-18 NOTE — Op Note (Signed)
[facility] Gastroenterology Patient Name: Sara Munoz Procedure Date: 09/18/2017 12:11 PM MRN: [id number] Account #: [id number] Date of Birth: Jul 02, 1965 Admit Type: Outpatient Age: 52 Room: [facility] ENDO ROOM 4 Gender: Female Note Status: Finalized Procedure:            Upper GI endoscopy Indications:          Abdominal pain Providers:            [name] MD, MD Referring MD:         Kipp Brood. Margretta Sidle (Referring MD) Medicines:            Monitored Anesthesia Care Complications:        No immediate complications. Procedure:            Pre-Anesthesia Assessment:                       - Prior to the procedure, a History and Physical was                        performed, and patient medications, allergies and                        sensitivities were reviewed. The patient's tolerance of                        previous anesthesia was reviewed.                       - The risks and benefits of the procedure and the                        sedation options and risks were discussed with the                        patient. All questions were answered and informed                        consent was obtained.                       - ASA Grade Assessment: III - A patient with severe                        systemic disease.                       After obtaining informed consent, the endoscope was                        passed under direct vision. Throughout the procedure,                        the patient's blood pressure, pulse, and oxygen                        saturations were monitored continuously. The Endoscope                        was introduced through the mouth, and advanced to the  third part of duodenum. The upper GI endoscopy was                        accomplished with ease. The patient tolerated the                        procedure well. Findings:      The esophagus was normal.      The examined duodenum was normal.      The entire  examined stomach was normal. Biopsies were taken with a cold       forceps for histology.      The cardia and gastric fundus were normal on retroflexion. Impression:           - Normal esophagus.                       - Normal examined duodenum.                       - Normal stomach. Biopsied. Recommendation:       - Await pathology results.                       - Discharge patient to home (with escort).                       - Resume previous diet.                       - Continue present medications.                       - Return to my office as previously scheduled. Procedure Code(s):    --- Professional ---                       [phone], Esophagogastroduodenoscopy, flexible, transoral;                        with biopsy, single or multiple Diagnosis Code(s):    --- Professional ---                       R10.9, Unspecified abdominal pain CPT copyright 2016 [employer] Medical [employer]. All rights reserved. The codes documented in this report are preliminary and upon coder review may  be revised to meet current compliance requirements. [name], MD [name] MD, MD 09/18/2017 12:21:52 PM This report has been signed electronically. Number of Addenda: 0 Note Initiated On: 09/18/2017 12:11 PM      [facility]

## 2017-09-18 NOTE — Anesthesia Preprocedure Evaluation (Addendum)
Anesthesia Evaluation  Patient identified by MRN, date of birth, ID band Patient awake    Reviewed: Allergy & Precautions, NPO status , Patient's Chart, lab work & pertinent test results, reviewed documented beta blocker date and time   Airway Mallampati: III  TM Distance: >3 FB     Dental  (+) Chipped   Pulmonary asthma , sleep apnea , COPD, Current Smoker,           Cardiovascular hypertension, Pt. on medications and Pt. on home beta blockers + CAD, + Past MI, + Cardiac Stents and + Peripheral Vascular Disease       Neuro/Psych  Headaches, PSYCHIATRIC DISORDERS Anxiety TIA Neuromuscular disease    GI/Hepatic GERD  ,(+) Hepatitis -  Endo/Other    Renal/GU      Musculoskeletal  (+) Arthritis ,   Abdominal   Peds  Hematology  (+) anemia ,   Anesthesia Other Findings   Reproductive/Obstetrics                            Anesthesia Physical Anesthesia Plan  ASA: II  Anesthesia Plan: General   Post-op Pain Management:    Induction: Intravenous  PONV Risk Score and Plan:   Airway Management Planned:   Additional Equipment:   Intra-op Plan:   Post-operative Plan:   Informed Consent: I have reviewed the patients History and Physical, chart, labs and discussed the procedure including the risks, benefits and alternatives for the proposed anesthesia with the patient or authorized representative who has indicated his/her understanding and acceptance.     Plan Discussed with: CRNA  Anesthesia Plan Comments:         Anesthesia Quick Evaluation

## 2017-09-18 NOTE — Anesthesia Post-op Follow-up Note (Signed)
Anesthesia QCDR form completed.        

## 2017-09-18 NOTE — H&P (Signed)
[name], MD [address], [place], [place], [place], [address] [address], [place], [place], [place], [address] Phone: [phone]  Fax: [phone]  Primary Care Physician:  [name], [name]   Pre-Procedure History & Physical: HPI:  Sara Munoz is a 52 y.o. female is here for an endoscopy    Past Medical History:  Diagnosis Date  . Anxiety   . Asthma   . Atypical chest pain 08/02/2015  . CAD (coronary artery disease)   . Chronic back pain   . COPD (chronic obstructive pulmonary disease) ([place])   . Coronary artery disease    a. Mild-non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/2014  . GERD (gastroesophageal reflux disease)   . Hypercholesteremia   . Hypertension   . Hypokalemia   . Hypomagnesemia 01/04/2014  . Liver disease   . MI (myocardial infarction) ([place])   . Opiate use 02/27/2016  . Osteoarthritis   . Stroke [facility])     Past Surgical History:  Procedure Laterality Date  . CARDIAC CATHETERIZATION Left 02/22/2016   Procedure: Left Heart Cath and Coronary Angiography;  Surgeon: Yolonda Kida, MD;  Location: [facility];  Service: Cardiovascular;  Laterality: Left;  . COLONOSCOPY WITH PROPOFOL N/A 09/19/2016   Procedure: COLONOSCOPY WITH PROPOFOL;  Surgeon: [name], MD;  Location: [facility];  Service: Endoscopy;  Laterality: N/A;  . hemorroids    . NASAL SINUS SURGERY    . ORIF FEMUR FRACTURE    . ORIF TIBIA & FIBULA FRACTURES    . OVARY SURGERY      Prior to Admission medications   Medication Sig Start Date End Date Taking? Authorizing Provider  albuterol (PROVENTIL HFA;VENTOLIN HFA) 108 (90 Base) MCG/ACT inhaler Inhale 2 puffs into the lungs every 6 (six) hours as needed for wheezing or shortness of breath. 05/26/17  Yes Carles Collet M, [name]  amLODipine (NORVASC) 5 MG tablet Take 1 tablet (5 mg total) by mouth daily. 05/26/17 05/26/18 Yes [name], [name]  aspirin EC 81 MG tablet Take 81 mg by mouth daily.   Yes [provider]  budesonide-formoterol (SYMBICORT) 160-4.5 MCG/ACT inhaler Inhale 2 puffs into the lungs 2 (two) times daily. 05/26/17  Yes [name], [name]  busPIRone (BUSPAR) 15 MG tablet Take 1 tablet (15 mg total) by mouth 2 (two) times daily. 06/26/17 09/24/17 Yes [name], [name]  docusate sodium (COLACE) 100 MG capsule Take 200 mg by mouth 2 (two) times daily as needed for mild constipation.   Yes [provider]  gabapentin (NEURONTIN) 600 MG tablet Take 1 tablet (600 mg total) by mouth 3 (three) times daily. 06/26/17 09/24/17 Yes [name], [name]  hydrOXYzine (ATARAX/VISTARIL) 25 MG tablet Take by mouth.   Yes [provider]  hydrOXYzine (VISTARIL) 25 MG capsule Take 1 capsule (25 mg total) by mouth daily as needed (only for severe anxiety sx.). 08/05/17  Yes Eappen, Saramma, MD  lisinopril (PRINIVIL,ZESTRIL) 40 MG tablet Take 1 tablet (40 mg total) by mouth daily. 08/05/17  Yes Carles Collet M, [name]  metoprolol tartrate (LOPRESSOR) 25 MG tablet Take 1 tablet (25 mg total) by mouth 2 (two) times daily. 08/05/17  Yes [name], [name]  nitroGLYCERIN (NITROSTAT) 0.4 MG SL tablet Place 1 tablet (0.4 mg total) under the tongue every 5 (five) minutes x 3 doses as needed for chest pain. 08/03/15  Yes Demetrios Loll, MD  nystatin (MYCOSTATIN/NYSTOP) powder Apply topically 4 (four) times  daily. 05/26/17  Yes Carles Collet M, [name]  omeprazole (PRILOSEC) 40 MG capsule Take 1 capsule (40 mg total) daily by mouth. 05/07/17  Yes [name], MD  ondansetron (ZOFRAN-ODT) 8 MG disintegrating tablet Take 1 tablet (8 mg total) by mouth 2 (two) times daily. 08/21/17  Yes [name], [name]  oxyCODONE (OXY IR/ROXICODONE) 5 MG immediate release tablet Take 5 mg by mouth every 4 (four) hours as needed for severe pain.   Yes [provider]  ranolazine (RANEXA) 500 MG 12 hr tablet Take 500 mg by mouth 2 (two) times daily.   Yes [provider]  Tiotropium Bromide  Monohydrate (SPIRIVA RESPIMAT) 2.5 MCG/ACT AERS Inhale 1 puff into the lungs daily. 05/26/17  Yes [name], [name]  tiZANidine (ZANAFLEX) 2 MG tablet Take by mouth every 6 (six) hours as needed for muscle spasms.   Yes [provider]  venlafaxine XR (EFFEXOR-XR) 37.5 MG 24 hr capsule Take 1 capsule (37.5 mg total) by mouth daily with breakfast. 08/05/17  Yes Eappen, Saramma, MD  zolpidem (AMBIEN) 5 MG tablet Take 1 tablet (5 mg total) by mouth at bedtime as needed for sleep. 08/05/17  Yes Ursula Alert, MD  ALPRAZolam Duanne Moron) 1 MG tablet Take 1 tablet (1 mg total) by mouth at bedtime as needed for anxiety. Patient not taking: Reported on 09/18/2017 08/29/17   [name], MD  atorvastatin (LIPITOR) 20 MG tablet Take 20 mg by mouth at bedtime.    [provider]  MAVYRET 100-40 MG TABS Take 1 capsule by mouth 3 (three) times daily.    [provider]  Naloxone HCl (NARCAN IJ) Inject as directed as needed.    [provider]  sucralfate (CARAFATE) 1 GM/10ML suspension Take 10 mLs (1 g total) by mouth 4 (four) times daily. Patient not taking: Reported on 09/18/2017 09/17/17   [name], MD    Allergies as of 09/17/2017 - Review Complete 09/17/2017  Allergen Reaction Noted  . Levofloxacin Hives and Swelling 02/27/2012  . Flexeril [cyclobenzaprine] Hives, Swelling, and Other (See Comments) 11/13/2013  . Keflex [cephalexin] Hives 10/22/2010  . Ketorolac  10/22/2010  . Phenergan [promethazine hcl] Other (See Comments) 02/21/2017  . Toradol [ketorolac tromethamine] Swelling and Other (See Comments) 01/17/2015  . Tramadol Hives, Swelling, and Other (See Comments) 01/17/2015  . Zoloft [sertraline hcl] Swelling and Other (See Comments) 05/31/2015    Family History  Problem Relation Age of Onset  . Heart disease Mother   . Lung cancer Mother   . Ovarian cancer Mother   . Healthy Brother   . Healthy Brother   . Healthy Brother   . Diabetes Maternal Uncle     . Breast cancer Maternal Aunt     Social History   Socioeconomic History  . Marital status: Widowed    Spouse name: Not on file  . Number of children: 0  . Years of education: Not on file  . Highest education level: 9th grade  Occupational History  . Occupation: disabled  Social Needs  . Financial resource strain: Hard  . Food insecurity:    Worry: Often true    Inability: Often true  . Transportation needs:    Medical: Yes    Non-medical: Yes  Tobacco Use  . Smoking status: Current Every Day Smoker    Packs/day: 0.50    Types: Cigarettes  . Smokeless tobacco: Never Used  Substance and Sexual Activity  . Alcohol use: No    Alcohol/week: 0.0 oz  .  Drug use: Yes    Types: Marijuana    Comment: 2 months ago   . Sexual activity: Yes    Birth control/protection: None  Lifestyle  . Physical activity:    Days per week: 0 days    Minutes per session: 0 min  . Stress: Not on file  Relationships  . Social connections:    Talks on phone: Twice a week    Gets together: Never    Attends religious service: Never    Active member of club or organization: No    Attends meetings of clubs or organizations: Never    Relationship status: Widowed  . Intimate partner violence:    Fear of current or ex partner: No    Emotionally abused: No    Physically abused: No    Forced sexual activity: No  Other Topics Concern  . Not on file  Social History Narrative   Moved from [place].    Review of Systems: See HPI, otherwise negative ROS  Physical Exam: BP 128/80   Pulse 67   Temp 98 F (36.7 C) (Tympanic)   Resp 16   Ht 5\' 4"  (1.626 m)   Wt 180 lb (81.6 kg)   SpO2 100%   BMI 30.90 kg/m  General:   Alert,  pleasant and cooperative in NAD Head:  Normocephalic and atraumatic. Neck:  Supple; no masses or thyromegaly. Lungs:  Clear throughout to auscultation, normal respiratory effort.    Heart:  +S1, +S2, Regular rate and rhythm, No edema. Abdomen:  Soft, nontender and  nondistended. Normal bowel sounds, without guarding, and without rebound.   Neurologic:  Alert and  oriented x4;  grossly normal neurologically.  Impression/Plan: Sara Munoz is here for an endoscopy  to be performed for  evaluation of abdominal pain     Risks, benefits, limitations, and alternatives regarding endoscopy have been reviewed with the patient.  Questions have been answered.  All parties agreeable.   [name], MD  09/18/2017, 11:25 AM

## 2017-09-18 NOTE — Progress Notes (Signed)
pregnancy test not given due to patient menopausal. Patient states she has not had a period in over 6 months

## 2017-09-19 LAB — SURGICAL PATHOLOGY

## 2017-09-19 NOTE — Anesthesia Postprocedure Evaluation (Signed)
Anesthesia Post Note  Patient: Sara Munoz  Procedure(s) Performed: ESOPHAGOGASTRODUODENOSCOPY (EGD) WITH PROPOFOL (N/A )  Patient location during evaluation: Endoscopy Anesthesia Type: General Level of consciousness: awake and alert Pain management: pain level controlled Vital Signs Assessment: post-procedure vital signs reviewed and stable Respiratory status: spontaneous breathing, nonlabored ventilation and respiratory function stable Cardiovascular status: blood pressure returned to baseline and stable Postop Assessment: no apparent nausea or vomiting Anesthetic complications: no     Last Vitals:  Vitals:   09/18/17 1039 09/18/17 1225  BP: 128/80 105/68  Pulse: 67   Resp: 16   Temp: 36.7 C (!) 36.4 C  SpO2: 100%     Last Pain:  Vitals:   09/19/17 0743  TempSrc:   PainSc: 0-No pain                 Alphonsus Sias

## 2017-09-21 ENCOUNTER — Encounter: Payer: Self-pay | Admitting: Gastroenterology

## 2017-09-22 ENCOUNTER — Encounter: Payer: Self-pay | Admitting: Gastroenterology

## 2017-10-01 ENCOUNTER — Ambulatory Visit (INDEPENDENT_AMBULATORY_CARE_PROVIDER_SITE_OTHER): Payer: Medicaid Other | Admitting: Neurology

## 2017-10-01 ENCOUNTER — Ambulatory Visit: Payer: Medicaid Other | Admitting: Neurology

## 2017-10-01 DIAGNOSIS — M542 Cervicalgia: Secondary | ICD-10-CM

## 2017-10-01 DIAGNOSIS — R202 Paresthesia of skin: Secondary | ICD-10-CM

## 2017-10-01 DIAGNOSIS — R51 Headache: Secondary | ICD-10-CM

## 2017-10-01 DIAGNOSIS — R519 Headache, unspecified: Secondary | ICD-10-CM

## 2017-10-01 NOTE — Procedures (Signed)
Full Name: Sara Munoz Gender: Female MRN #: [id number] Date of Birth: 05-03-2066    Visit Date: 10/01/17 08:04 Age: 52 Years 40 Months Old Examining Physician: [name], MD  Referring Physician: Krista Blue, MD History: 52 years old female, with history of chronic neck pain, radiating pain along her spine, intermittent bilateral upper and lower extremity paresthesia  Summary of the tests:  Nerve conductions study: Right median sensory responses showed borderline prolonged peak latency with normal snap amplitude.  Right median mixed response was 0.6 ms prolonged in comparison to ipsilateral right ulnar mixed response.  Right median and ulnar motor responses were normal.  Right sural, superficial peroneal sensory responses are normal.  Right peroneal to EDB, and tibial motor responses were normal.  Electromyography: Selective needle examinations of bilateral upper extremity and bilateral cervical paraspinal muscles were performed.  There is mild neuropathic changes at bilateral triceps muscles,  Conclusion: This is a mild abnormal study.  There is electrodiagnostic evidence of mild bilateral chronic C7 radiculopathies.  There is no evidence of active process.  In addition, there is evidence of right median neuropathy across the wrist, consistent with mild right carpal tunnel syndrome.    ------------------------------- [name], M.D.  [facility] [place], [place] [address] Tel: [phone] Fax: [phone]        [facility]    Nerve / Sites Muscle Latency Ref. Amplitude Ref. Rel Amp Segments Distance Velocity Ref. Area    ms ms mV mV %  cm m/s m/s mVms  R Median - APB     Wrist APB 3.4 ?4.4 8.5 ?4.0 100 Wrist - APB 7   24.6     Upper arm APB 7.0  8.4  98.6 Upper arm - Wrist 20 56 ?49 23.9  L Median - APB     Wrist APB 3.2 ?4.4 7.1 ?4.0 100 Wrist - APB 7   22.6     Upper arm APB 6.8  6.4  89.5 Upper arm - Wrist 20 56 ?49 19.5  R Ulnar - ADM     Wrist ADM 2.3 ?3.3 8.2 ?6.0 100 Wrist - ADM 7   21.8     B.Elbow ADM 5.3  8.3  101 B.Elbow - Wrist 17 58 ?49 22.0     A.Elbow ADM 7.1  7.5  90.8 A.Elbow - B.Elbow 10 53 ?49 21.5         A.Elbow - Wrist      L Ulnar - ADM     Wrist ADM 3.1 ?3.3 7.8 ?6.0 100 Wrist - ADM 7   18.9     B.Elbow ADM 6.3  7.7  99.2 B.Elbow - Wrist 17 54 ?49 18.6     A.Elbow ADM 8.0  7.0  91.1 A.Elbow - B.Elbow 10 56 ?49 18.3         A.Elbow - Wrist      R Peroneal - EDB     Ankle EDB 4.4 ?6.5 5.7 ?2.0 100 Ankle - EDB 9   19.4     Fib head EDB 9.9  5.5  97 Fib head - Ankle 30 54 ?44 19.4     Pop fossa EDB 11.9  5.6  100 Pop fossa - Fib head 10 52 ?44 20.7         Pop fossa - Ankle      R Tibial - AH     Ankle AH 3.5 ?5.8 16.4 ?4.0 100 Ankle - AH 9  38.7     Pop fossa AH 10.8  12.3  74.8 Pop fossa - Ankle 35 48 ?41 36.4                 SNC    Nerve / Sites Rec. Site Peak Lat Ref.  Amp Ref. Segments Distance Peak Diff Ref.    ms ms V V  cm ms ms  R Sural - Ankle (Calf)     Calf Ankle 3.9 ?4.4 6 ?6 Calf - Ankle 14    R Superficial peroneal - Ankle     Lat leg Ankle 4.2 ?4.4 6 ?6 Lat leg - Ankle 14    R Median, Ulnar - Transcarpal comparison     Median Palm Wrist 2.6 ?2.2 32 ?35 Median Palm - Wrist 8       Ulnar Palm Wrist 2.0 ?2.2 23 ?12 Ulnar Palm - Wrist 8          Median Palm - Ulnar Palm  0.6 ?0.4  L Median, Ulnar - Transcarpal comparison     Median Palm Wrist 2.1 ?2.2 51 ?35 Median Palm - Wrist 8       Ulnar Palm Wrist 1.9 ?2.2 20 ?12 Ulnar Palm - Wrist 8          Median Palm - Ulnar Palm  0.2 ?0.4  R Median - Orthodromic (Dig II, Mid palm)     Dig II Wrist 3.4 ?3.4 13 ?10 Dig II - Wrist 13    L Median - Orthodromic (Dig II, Mid palm)     Dig II Wrist 3.1 ?3.4 17 ?10 Dig II - Wrist 13    R Ulnar - Orthodromic, (Dig V, Mid palm)     Dig V Wrist 2.5 ?3.1 13 ?5 Dig V - Wrist 11    L Ulnar - Orthodromic, (Dig V, Mid palm)     Dig V Wrist 2.5 ?3.1 7 ?5 Dig V - Wrist 30                       F   Wave    Nerve F Lat Ref.   ms ms  R Tibial - AH 46.6 ?56.0  R Ulnar - ADM 25.3 ?32.0  L Ulnar - ADM 25.9 ?32.0           EMG full       EMG Summary Table    Spontaneous MUAP Recruitment  Muscle IA Fib PSW Fasc Other Amp Dur. Poly Pattern  L. First dorsal interosseous Normal None None None _______ Normal Normal Normal Normal  L. Pronator teres Normal None None None _______ Normal Normal Normal Normal  L. Deltoid Normal None None None _______ Normal Normal Normal Normal  L. Biceps brachii Normal None None None _______ Normal Normal Normal Normal  L. Triceps brachii Normal None None None _______ Normal Normal Normal Reduced  L. Extensor digitorum communis Normal None None None _______ Normal Normal Normal Normal  R. First dorsal interosseous Normal None None None _______ Normal Normal Normal Normal  R. Pronator teres Normal None None None _______ Normal Normal Normal Normal  R. Biceps brachii Normal None None None _______ Normal Normal Normal Normal  R. Deltoid Normal None None None _______ Normal Normal Normal Normal  R. Triceps brachii Normal None None None _______ Normal Normal Normal Reduced  R. Extensor digitorum communis Normal None None None _______ Normal Normal Normal Normal  R. Cervical paraspinals Normal None None None _______ Normal  Normal Normal Normal  L. Cervical paraspinals Normal None None None _______ Normal Normal Normal Normal  R. Flexor carpi ulnaris Normal None None None _______ Normal Normal Normal Reduced

## 2017-10-01 NOTE — Progress Notes (Signed)
PATIENT: Sara Munoz DOB: 08-28-1965  No chief complaint on file.    HISTORICAL  Sara Munoz is a 52 year old female, seen in refer by primary care PA Carles Collet for evaluation of paresthesia, persistent headaches, initial evaluation was on August 29, 2017.  I reviewed and summarized the referring note, she had hypertension, hyperlipidemia, COPD, reported coronary artery disease, heart attack, stroke in the past, hepatitis C, is receiving treatment, will finish in March 2019, she has been on disability.  On polypharmacy treatment.  Since summer 2018, she reported gradual onset worsening neck pain, radiating paresthesia along her spine and 4 extremity with neck movement, gradually getting worse over the past few months, also noticed spreading of neck pain to her vortex area, almost daily pressure headaches, she did have a history of migraine in the past, but this headaches are persistent, moderate degree on a daily basis.  She has tried aspirin, NSAIDs with limited help, she denies bowel and bladder incontinence, no significant gait abnormality.  She reported few episode of passing out since 2018, most recent one in January 2019, woke up in the middle of the night using bathroom, no warning signs, woke up on the floor, with bruising her face, whole body achy pain, urinary incontinence.  Reviewed CT head without contrast November 2018, no acute abnormality, CT cervical spine showed multilevel degenerative disc disease, with mild to moderate canal stenosis C6-C7.  Laboratory evaluation seen November 2018, normal CBC, BMP.  UPDATE October 01 2017: Patient complains 6 months history of constant lower neck pain, radiating along her spine, often spreading forward, headache, she is under pain management at [facility], is planning on to have cervical epidural injection, also on narcotic treatment,  I have personally reviewed MRI of the brain, no acute intracranial abnormality, mild  supratentorium small vessel disease.  MRI of cervical spine, central disc protrusion at C6 and 7, with mild to moderate canal stenosis, cord flattening, canal diameter 6 mm, but no cord signal change, and also moderate bilateral foraminal narrowing,  Electrodiagnostic study October 01 2017 Showed Mild Right Carpal Tunl. syndromes, there is no significant cervical radiculopathies,  On examinations, there is no evidence of muscle weakness, or cervical myelopathy.  REVIEW OF SYSTEMS: Full 14 system review of systems performed and notable only for as above   ALLERGIES: Allergies  Allergen Reactions  . Levofloxacin Hives and Swelling  . Flexeril [Cyclobenzaprine] Hives, Swelling and Other (See Comments)    Reaction:  Facial/lip swelling  Reaction:  Facial/lip swelling   . Keflex [Cephalexin] Hives  . Ketorolac   . Phenergan [Promethazine Hcl] Other (See Comments)    Agitation.   . Toradol [Ketorolac Tromethamine] Swelling and Other (See Comments)    Reaction:  Facial/tongue swelling  Reaction:  Facial/tongue swelling   . Tramadol Hives, Swelling and Other (See Comments)    Reaction:  Lip swelling  Reaction:  Lip swelling   . Zoloft [Sertraline Hcl] Swelling and Other (See Comments)    Reaction:  Tongue swelling  Reaction:  Tongue swelling     HOME MEDICATIONS: Current Outpatient Medications  Medication Sig Dispense Refill  . albuterol (PROVENTIL HFA;VENTOLIN HFA) 108 (90 Base) MCG/ACT inhaler Inhale 2 puffs into the lungs every 6 (six) hours as needed for wheezing or shortness of breath. 1 Inhaler 2  . ALPRAZolam (XANAX) 1 MG tablet Take 1 tablet (1 mg total) by mouth at bedtime as needed for anxiety. (Patient not taking: Reported on 09/18/2017) 3 tablet 0  .  amLODipine (NORVASC) 5 MG tablet Take 1 tablet (5 mg total) by mouth daily. 30 tablet 0  . aspirin EC 81 MG tablet Take 81 mg by mouth daily.    Marland Kitchen atorvastatin (LIPITOR) 20 MG tablet Take 20 mg by mouth at bedtime.    .  budesonide-formoterol (SYMBICORT) 160-4.5 MCG/ACT inhaler Inhale 2 puffs into the lungs 2 (two) times daily. 1 Inhaler 0  . docusate sodium (COLACE) 100 MG capsule Take 200 mg by mouth 2 (two) times daily as needed for mild constipation.    . gabapentin (NEURONTIN) 600 MG tablet Take 1 tablet (600 mg total) by mouth 3 (three) times daily. 90 tablet 2  . hydrOXYzine (ATARAX/VISTARIL) 25 MG tablet Take by mouth.    . hydrOXYzine (VISTARIL) 25 MG capsule Take 1 capsule (25 mg total) by mouth daily as needed (only for severe anxiety sx.). 30 capsule 1  . lisinopril (PRINIVIL,ZESTRIL) 40 MG tablet Take 1 tablet (40 mg total) by mouth daily. 90 tablet 1  . MAVYRET 100-40 MG TABS Take 1 capsule by mouth 3 (three) times daily.    . metoprolol tartrate (LOPRESSOR) 25 MG tablet Take 1 tablet (25 mg total) by mouth 2 (two) times daily. 180 tablet 1  . Naloxone HCl (NARCAN IJ) Inject as directed as needed.    . nitroGLYCERIN (NITROSTAT) 0.4 MG SL tablet Place 1 tablet (0.4 mg total) under the tongue every 5 (five) minutes x 3 doses as needed for chest pain. 30 tablet 2  . nystatin (MYCOSTATIN/NYSTOP) powder Apply topically 4 (four) times daily. 15 g 0  . omeprazole (PRILOSEC) 40 MG capsule Take 1 capsule (40 mg total) daily by mouth. 90 capsule 3  . ondansetron (ZOFRAN-ODT) 8 MG disintegrating tablet Take 1 tablet (8 mg total) by mouth 2 (two) times daily. 20 tablet 1  . oxyCODONE (OXY IR/ROXICODONE) 5 MG immediate release tablet Take 5 mg by mouth every 4 (four) hours as needed for severe pain.    . ranolazine (RANEXA) 500 MG 12 hr tablet Take 500 mg by mouth 2 (two) times daily.    . sucralfate (CARAFATE) 1 GM/10ML suspension Take 10 mLs (1 g total) by mouth 4 (four) times daily. (Patient not taking: Reported on 09/18/2017) 420 mL 1  . Tiotropium Bromide Monohydrate (SPIRIVA RESPIMAT) 2.5 MCG/ACT AERS Inhale 1 puff into the lungs daily. 4 g 2  . tiZANidine (ZANAFLEX) 2 MG tablet Take by mouth every 6 (six)  hours as needed for muscle spasms.    Marland Kitchen venlafaxine XR (EFFEXOR-XR) 37.5 MG 24 hr capsule Take 1 capsule (37.5 mg total) by mouth daily with breakfast. 30 capsule 1  . zolpidem (AMBIEN) 5 MG tablet Take 1 tablet (5 mg total) by mouth at bedtime as needed for sleep. 30 tablet 1   No current facility-administered medications for this visit.     PAST MEDICAL HISTORY: Past Medical History:  Diagnosis Date  . Anxiety   . Asthma   . Atypical chest pain 08/02/2015  . CAD (coronary artery disease)   . Chronic back pain   . COPD (chronic obstructive pulmonary disease) ([place])   . Coronary artery disease    a. Mild-non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/2014  . GERD (gastroesophageal reflux disease)   . Hypercholesteremia   . Hypertension   . Hypokalemia   . Hypomagnesemia 01/04/2014  . Liver disease   . MI (myocardial infarction) ([place])   . Opiate use 02/27/2016  . Osteoarthritis   . Stroke [facility])  PAST SURGICAL HISTORY: Past Surgical History:  Procedure Laterality Date  . CARDIAC CATHETERIZATION Left 02/22/2016   Procedure: Left Heart Cath and Coronary Angiography;  Surgeon: Yolonda Kida, MD;  Location: [facility];  Service: Cardiovascular;  Laterality: Left;  . COLONOSCOPY WITH PROPOFOL N/A 09/19/2016   Procedure: COLONOSCOPY WITH PROPOFOL;  Surgeon: [name], MD;  Location: [facility] ENDOSCOPY;  Service: Endoscopy;  Laterality: N/A;  . ESOPHAGOGASTRODUODENOSCOPY (EGD) WITH PROPOFOL N/A 09/18/2017   Procedure: ESOPHAGOGASTRODUODENOSCOPY (EGD) WITH PROPOFOL;  Surgeon: [name], MD;  Location: [facility] ENDOSCOPY;  Service: Gastroenterology;  Laterality: N/A;  . hemorroids    . NASAL SINUS SURGERY    . ORIF FEMUR FRACTURE    . ORIF TIBIA & FIBULA FRACTURES    . OVARY SURGERY      FAMILY HISTORY: Family History  Problem Relation Age of Onset  . Heart disease Mother   . Lung cancer Mother   . Ovarian cancer Mother   . Healthy Brother   . Healthy Brother   . Healthy Brother   .  Diabetes Maternal Uncle   . Breast cancer Maternal Aunt     SOCIAL HISTORY:  Social History   Socioeconomic History  . Marital status: Widowed    Spouse name: Not on file  . Number of children: 0  . Years of education: Not on file  . Highest education level: 9th grade  Occupational History  . Occupation: disabled  Social Needs  . Financial resource strain: Hard  . Food insecurity:    Worry: Often true    Inability: Often true  . Transportation needs:    Medical: Yes    Non-medical: Yes  Tobacco Use  . Smoking status: Current Every Day Smoker    Packs/day: 0.50    Types: Cigarettes  . Smokeless tobacco: Never Used  Substance and Sexual Activity  . Alcohol use: No    Alcohol/week: 0.0 oz  . Drug use: Yes    Types: Marijuana    Comment: 2 months ago   . Sexual activity: Yes    Birth control/protection: None  Lifestyle  . Physical activity:    Days per week: 0 days    Minutes per session: 0 min  . Stress: Not on file  Relationships  . Social connections:    Talks on phone: Twice a week    Gets together: Never    Attends religious service: Never    Active member of club or organization: No    Attends meetings of clubs or organizations: Never    Relationship status: Widowed  . Intimate partner violence:    Fear of current or ex partner: No    Emotionally abused: No    Physically abused: No    Forced sexual activity: No  Other Topics Concern  . Not on file  Social History Narrative   Moved from [place].     PHYSICAL EXAM   There were no vitals filed for this visit.  Not recorded      There is no height or weight on file to calculate BMI.  PHYSICAL EXAMNIATION:  Gen: NAD, conversant, well nourised, obese, well groomed                     Cardiovascular: Regular rate rhythm, no peripheral edema, warm, nontender. Eyes: Conjunctivae clear without exudates or hemorrhage Neck: Supple, no carotid bruits. Pulmonary: Clear to auscultation bilaterally    NEUROLOGICAL EXAM:  MENTAL STATUS: Speech:    Speech is  normal; fluent and spontaneous with normal comprehension.  Cognition:     Orientation to time, place and person     Normal recent and remote memory     Normal Attention span and concentration     Normal Language, naming, repeating,spontaneous speech     Fund of knowledge   CRANIAL NERVES: CN II: Visual fields are full to confrontation. Fundoscopic exam is normal with sharp discs and no vascular changes. Pupils are round equal and briskly reactive to light. CN III, IV, VI: extraocular movement are normal. No ptosis. CN V: Facial sensation is intact to pinprick in all 3 divisions bilaterally. Corneal responses are intact.  CN VII: Face is symmetric with normal eye closure and smile. CN VIII: Hearing is normal to rubbing fingers CN IX, X: Palate elevates symmetrically. Phonation is normal. CN XI: Head turning and shoulder shrug are intact CN XII: Tongue is midline with normal movements and no atrophy.  MOTOR: There is no pronator drift of out-stretched arms. Muscle bulk and tone are normal. Muscle strength is normal.  REFLEXES: Reflexes are 2+ and symmetric at the biceps, triceps, knees, and ankles. Plantar responses are flexor.  SENSORY: Mildly length dependent decreased to light touch, pinprick, vibratory sensations.  COORDINATION: Rapid alternating movements and fine finger movements are intact. There is no dysmetria on finger-to-nose and heel-knee-shin.    GAIT/STANCE: Posture is normal. Gait is steady with normal steps, base, arm swing, and turning. Heel and toe walking are normal. Tandem gait is normal.  Romberg is absent.   DIAGNOSTIC DATA (LABS, IMAGING, TESTING) - I reviewed patient records, labs, notes, testing and imaging myself where available.   ASSESSMENT AND PLAN  Rukiya Hodgkins Allocca is a 52 y.o. female   Cervical spondylitic disease, Chronic neck pain, Persistent daily headaches,  Likely cervicogenic  component, but on examinations, there is no evidence of cervical myelopathy, or active cervical radiculopathy despite EMG nerve conduction study, she would benefit conservative treatment,  Continue physical therapy, pain management, neck stretching exercise, heating pad,   [name], M.D. Ph.D.  [facility] [address], [place], [place] [address] Ph: [phone] Fax: [phone]  CC: [name], [name]

## 2017-10-03 ENCOUNTER — Ambulatory Visit: Payer: Medicaid Other | Admitting: Obstetrics and Gynecology

## 2017-10-03 ENCOUNTER — Ambulatory Visit (INDEPENDENT_AMBULATORY_CARE_PROVIDER_SITE_OTHER): Payer: Medicaid Other

## 2017-10-03 VITALS — BP 102/70 | HR 70 | Ht 64.0 in | Wt 182.0 lb

## 2017-10-03 DIAGNOSIS — R102 Pelvic and perineal pain: Secondary | ICD-10-CM | POA: Diagnosis not present

## 2017-10-03 DIAGNOSIS — A5909 Other urogenital trichomoniasis: Secondary | ICD-10-CM | POA: Diagnosis not present

## 2017-10-03 DIAGNOSIS — G8929 Other chronic pain: Secondary | ICD-10-CM | POA: Diagnosis not present

## 2017-10-03 DIAGNOSIS — N83202 Unspecified ovarian cyst, left side: Secondary | ICD-10-CM | POA: Diagnosis not present

## 2017-10-03 NOTE — Progress Notes (Signed)
Gynecology Ultrasound Follow Up  Chief Complaint:  Chief Complaint  Patient presents with  . GYN U/S    left ovary pain     History of Present Illness: Patient is a 52 y.o. female who presents today for ultrasound evaluation of previously imaged left ovarian cyst.  Ultrasound demonstrates the following findgins Adnexa: normal size, shape, and consistency.  No adnexal cysts Uterus: Non-enlarged with endometrial stripe 10.17mm Additional: No free fluid.  Area of hypolucency in the anterior portion of uterus appears consistent with small venous sinus.  We do not see the fibroid previously noted on CT scan on today's study.  Review of Systems: ROS  Past Medical History:  Past Medical History:  Diagnosis Date  . Anxiety   . Asthma   . Atypical chest pain 08/02/2015  . CAD (coronary artery disease)   . Chronic back pain   . COPD (chronic obstructive pulmonary disease) ([place])   . Coronary artery disease    a. Mild-non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/2014  . GERD (gastroesophageal reflux disease)   . Hypercholesteremia   . Hypertension   . Hypokalemia   . Hypomagnesemia 01/04/2014  . Liver disease   . MI (myocardial infarction) ([place])   . Opiate use 02/27/2016  . Osteoarthritis   . Stroke [facility])     Past Surgical History:  Past Surgical History:  Procedure Laterality Date  . CARDIAC CATHETERIZATION Left 02/22/2016   Procedure: Left Heart Cath and Coronary Angiography;  Surgeon: Yolonda Kida, MD;  Location: [place] CV LAB;  Service: Cardiovascular;  Laterality: Left;  . COLONOSCOPY WITH PROPOFOL N/A 09/19/2016   Procedure: COLONOSCOPY WITH PROPOFOL;  Surgeon: [name], MD;  Location: [facility] ENDOSCOPY;  Service: Endoscopy;  Laterality: N/A;  . ESOPHAGOGASTRODUODENOSCOPY (EGD) WITH PROPOFOL N/A 09/18/2017   Procedure: ESOPHAGOGASTRODUODENOSCOPY (EGD) WITH PROPOFOL;  Surgeon: [name], MD;  Location: [facility] ENDOSCOPY;  Service: Gastroenterology;  Laterality: N/A;  .  hemorroids    . NASAL SINUS SURGERY    . ORIF FEMUR FRACTURE    . ORIF TIBIA & FIBULA FRACTURES    . OVARY SURGERY      Gynecologic History:  No LMP recorded. (Menstrual status: Perimenopausal). Last Pap: 07/29/2016  Results were: .NILM HPV negative  Family History:  Family History  Problem Relation Age of Onset  . Heart disease Mother   . Lung cancer Mother   . Ovarian cancer Mother   . Healthy Brother   . Healthy Brother   . Healthy Brother   . Diabetes Maternal Uncle   . Breast cancer Maternal Aunt     Social History:  Social History   Socioeconomic History  . Marital status: Widowed    Spouse name: Not on file  . Number of children: 0  . Years of education: Not on file  . Highest education level: 9th grade  Occupational History  . Occupation: disabled  Social Needs  . Financial resource strain: Hard  . Food insecurity:    Worry: Often true    Inability: Often true  . Transportation needs:    Medical: Yes    Non-medical: Yes  Tobacco Use  . Smoking status: Current Every Day Smoker    Packs/day: 0.50    Types: Cigarettes  . Smokeless tobacco: Never Used  Substance and Sexual Activity  . Alcohol use: No    Alcohol/week: 0.0 oz  . Drug use: Yes    Types: Marijuana    Comment: 2 months ago   . Sexual  activity: Yes    Birth control/protection: None  Lifestyle  . Physical activity:    Days per week: 0 days    Minutes per session: 0 min  . Stress: Not on file  Relationships  . Social connections:    Talks on phone: Twice a week    Gets together: Never    Attends religious service: Never    Active member of club or organization: No    Attends meetings of clubs or organizations: Never    Relationship status: Widowed  . Intimate partner violence:    Fear of current or ex partner: No    Emotionally abused: No    Physically abused: No    Forced sexual activity: No  Other Topics Concern  . Not on file  Social History Narrative   Moved from  [place].    Allergies:  Allergies  Allergen Reactions  . Levofloxacin Hives and Swelling  . Flexeril [Cyclobenzaprine] Hives, Swelling and Other (See Comments)    Reaction:  Facial/lip swelling  Reaction:  Facial/lip swelling   . Keflex [Cephalexin] Hives  . Ketorolac   . Phenergan [Promethazine Hcl] Other (See Comments)    Agitation.   . Toradol [Ketorolac Tromethamine] Swelling and Other (See Comments)    Reaction:  Facial/tongue swelling  Reaction:  Facial/tongue swelling   . Tramadol Hives, Swelling and Other (See Comments)    Reaction:  Lip swelling  Reaction:  Lip swelling   . Zoloft [Sertraline Hcl] Swelling and Other (See Comments)    Reaction:  Tongue swelling  Reaction:  Tongue swelling     Medications: Prior to Admission medications   Medication Sig Start Date End Date Taking? Authorizing Provider  albuterol (PROVENTIL HFA;VENTOLIN HFA) 108 (90 Base) MCG/ACT inhaler Inhale 2 puffs into the lungs every 6 (six) hours as needed for wheezing or shortness of breath. 05/26/17  Yes Carles Collet M, [name]  amLODipine (NORVASC) 5 MG tablet Take 1 tablet (5 mg total) by mouth daily. 05/26/17 05/26/18 Yes [name], [name]  aspirin EC 81 MG tablet Take 81 mg by mouth daily.   Yes [provider]  atorvastatin (LIPITOR) 20 MG tablet Take 20 mg by mouth at bedtime.   Yes [provider]  budesonide-formoterol (SYMBICORT) 160-4.5 MCG/ACT inhaler Inhale 2 puffs into the lungs 2 (two) times daily. 05/26/17  Yes Carles Collet M, [name]  docusate sodium (COLACE) 100 MG capsule Take 200 mg by mouth 2 (two) times daily as needed for mild constipation.   Yes [provider]  gabapentin (NEURONTIN) 600 MG tablet Take 1 tablet (600 mg total) by mouth 3 (three) times daily. 06/26/17 10/03/17 Yes [name], [name]  hydrOXYzine (ATARAX/VISTARIL) 25 MG tablet Take by mouth.   Yes [provider]  lisinopril (PRINIVIL,ZESTRIL) 40 MG tablet Take 1 tablet  (40 mg total) by mouth daily. 08/05/17  Yes Carles Collet M, [name]  metoprolol tartrate (LOPRESSOR) 25 MG tablet Take 1 tablet (25 mg total) by mouth 2 (two) times daily. 08/05/17  Yes [name], [name]  nitroGLYCERIN (NITROSTAT) 0.4 MG SL tablet Place 1 tablet (0.4 mg total) under the tongue every 5 (five) minutes x 3 doses as needed for chest pain. 08/03/15  Yes Demetrios Loll, MD  omeprazole (PRILOSEC) 40 MG capsule Take 1 capsule (40 mg total) daily by mouth. 05/07/17  Yes [name], MD  ondansetron (ZOFRAN-ODT) 8 MG disintegrating tablet Take 1 tablet (8 mg total) by mouth 2 (two) times daily. 08/21/17  Yes  [name], [name]  oxyCODONE (OXY IR/ROXICODONE) 5 MG immediate release tablet Take 5 mg by mouth every 4 (four) hours as needed for severe pain.   Yes [provider]  ranolazine (RANEXA) 500 MG 12 hr tablet Take 500 mg by mouth 2 (two) times daily.   Yes [provider]  Tiotropium Bromide Monohydrate (SPIRIVA RESPIMAT) 2.5 MCG/ACT AERS Inhale 1 puff into the lungs daily. 05/26/17  Yes [name], [name]  tiZANidine (ZANAFLEX) 2 MG tablet Take by mouth every 6 (six) hours as needed for muscle spasms.   Yes [provider]  venlafaxine XR (EFFEXOR-XR) 37.5 MG 24 hr capsule Take 1 capsule (37.5 mg total) by mouth daily with breakfast. 08/05/17  Yes Eappen, Saramma, MD  zolpidem (AMBIEN) 5 MG tablet Take 1 tablet (5 mg total) by mouth at bedtime as needed for sleep. 08/05/17  Yes Ursula Alert, MD  Naloxone HCl (NARCAN IJ) Inject as directed as needed.    [provider]    Physical Exam Vitals: Blood pressure 102/70, pulse 70, height 5\' 4"  (1.626 m), weight 182 lb (82.6 kg).  General: NAD HEENT: normocephalic, anicteric Pulmonary: No increased work of breathing Extremities: no edema, erythema, or tenderness Neurologic: Grossly intact, normal gait Psychiatric: mood appropriate, affect full   Assessment: 52 y.o. G0P0000 No problem-specific  Assessment & Plan notes found for this encounter.   Plan: Problem List Items Addressed This Visit    None    Visit Diagnoses    Left ovarian cyst    -  Primary   Trichomonal cervicitis          1) Left ovarian cyst - resolution on ultrasound today.  Discussed follow up should she get new symptoms given that she does have a history of prior ovarian cyst treated surgically via cystectomy  2) Trichomonas - treated with flagyl, no current symptoms.  Test of cure offered but declined.  3) Return in about 1 year (around 10/04/2018) for annual exam.    Malachy Mood, MD, [place], [facility] 10/03/2017, 2:57 PM

## 2017-10-30 ENCOUNTER — Ambulatory Visit: Payer: Medicaid Other | Admitting: Neurology

## 2017-12-03 ENCOUNTER — Other Ambulatory Visit: Payer: Self-pay | Admitting: Physician Assistant

## 2017-12-03 DIAGNOSIS — R110 Nausea: Secondary | ICD-10-CM

## 2017-12-10 ENCOUNTER — Ambulatory Visit (INDEPENDENT_AMBULATORY_CARE_PROVIDER_SITE_OTHER): Payer: Medicaid Other | Admitting: Physician Assistant

## 2017-12-10 ENCOUNTER — Telehealth: Payer: Self-pay | Admitting: Neurology

## 2017-12-10 DIAGNOSIS — K732 Chronic active hepatitis, not elsewhere classified: Secondary | ICD-10-CM

## 2017-12-10 DIAGNOSIS — Z23 Encounter for immunization: Secondary | ICD-10-CM | POA: Diagnosis not present

## 2017-12-10 NOTE — Progress Notes (Signed)
Patient received final series of hep B. Sat the standard 10 minutes and tolerated vaccine well.

## 2017-12-10 NOTE — Addendum Note (Signed)
Addended by: Ashley Royalty E on: 12/10/2017 12:34 PM   Modules accepted: Orders

## 2017-12-11 NOTE — Telephone Encounter (Signed)
error 

## 2017-12-15 ENCOUNTER — Other Ambulatory Visit: Payer: Self-pay | Admitting: Gastroenterology

## 2017-12-15 ENCOUNTER — Ambulatory Visit: Payer: Medicaid Other | Admitting: Neurology

## 2017-12-15 ENCOUNTER — Encounter

## 2017-12-15 ENCOUNTER — Other Ambulatory Visit: Payer: Self-pay | Admitting: Physician Assistant

## 2017-12-15 DIAGNOSIS — R110 Nausea: Secondary | ICD-10-CM

## 2017-12-15 DIAGNOSIS — R109 Unspecified abdominal pain: Secondary | ICD-10-CM

## 2017-12-29 ENCOUNTER — Emergency Department: Payer: Medicaid Other

## 2017-12-29 ENCOUNTER — Other Ambulatory Visit: Payer: Self-pay

## 2017-12-29 ENCOUNTER — Encounter: Payer: Self-pay | Admitting: Emergency Medicine

## 2017-12-29 ENCOUNTER — Emergency Department
Admission: EM | Admit: 2017-12-29 | Discharge: 2017-12-30 | Disposition: A | Discharge status: Home / Self Care | Payer: Medicaid Other | Attending: Emergency Medicine | Admitting: Emergency Medicine

## 2017-12-29 DIAGNOSIS — I252 Old myocardial infarction: Secondary | ICD-10-CM | POA: Diagnosis not present

## 2017-12-29 DIAGNOSIS — J45909 Unspecified asthma, uncomplicated: Secondary | ICD-10-CM | POA: Insufficient documentation

## 2017-12-29 DIAGNOSIS — F17210 Nicotine dependence, cigarettes, uncomplicated: Secondary | ICD-10-CM | POA: Insufficient documentation

## 2017-12-29 DIAGNOSIS — G4489 Other headache syndrome: Secondary | ICD-10-CM | POA: Insufficient documentation

## 2017-12-29 DIAGNOSIS — G8929 Other chronic pain: Secondary | ICD-10-CM | POA: Diagnosis not present

## 2017-12-29 DIAGNOSIS — M542 Cervicalgia: Secondary | ICD-10-CM

## 2017-12-29 DIAGNOSIS — Z7982 Long term (current) use of aspirin: Secondary | ICD-10-CM | POA: Diagnosis not present

## 2017-12-29 DIAGNOSIS — Z79899 Other long term (current) drug therapy: Secondary | ICD-10-CM | POA: Diagnosis not present

## 2017-12-29 DIAGNOSIS — Z8673 Personal history of transient ischemic attack (TIA), and cerebral infarction without residual deficits: Secondary | ICD-10-CM | POA: Diagnosis not present

## 2017-12-29 DIAGNOSIS — I2510 Atherosclerotic heart disease of native coronary artery without angina pectoris: Secondary | ICD-10-CM | POA: Insufficient documentation

## 2017-12-29 DIAGNOSIS — R0789 Other chest pain: Secondary | ICD-10-CM | POA: Diagnosis not present

## 2017-12-29 LAB — BASIC METABOLIC PANEL
Anion gap: 8 (ref 5–15)
BUN: 8 mg/dL (ref 6–20)
CO2: 26 mmol/L (ref 22–32)
Calcium: 8.8 mg/dL — ABNORMAL LOW (ref 8.9–10.3)
Chloride: 100 mmol/L (ref 98–111)
Creatinine, Ser: 0.76 mg/dL (ref 0.44–1.00)
GFR calc Af Amer: >60 mL/min (ref >60)
GFR calc non Af Amer: >60 mL/min (ref >60)
Glucose, Bld: 86 mg/dL (ref 70–99)
Potassium: 3.8 mmol/L (ref 3.5–5.1)
Sodium: 134 mmol/L — ABNORMAL LOW (ref 135–145)

## 2017-12-29 LAB — CBC
HCT: 38.9 % (ref 35.0–47.0)
Hemoglobin: 13.4 g/dL (ref 12.0–16.0)
MCH: 28.8 pg (ref 26.0–34.0)
MCHC: 34.6 g/dL (ref 32.0–36.0)
MCV: 83.2 fL (ref 80.0–100.0)
Platelets: 168 10*3/uL (ref 150–440)
RBC: 4.67 MIL/uL (ref 3.80–5.20)
RDW: 17.5 % — ABNORMAL HIGH (ref 11.5–14.5)
WBC: 9.1 10*3/uL (ref 3.6–11.0)

## 2017-12-29 LAB — TROPONIN I: Troponin I: <0.03 ng/mL (ref <0.03)

## 2017-12-29 MED ORDER — HYDROMORPHONE HCL 1 MG/ML IJ SOLN
1.0000 mg | Freq: Once | INTRAMUSCULAR | Status: AC
  Administered 2017-12-30: 1 mg via INTRAMUSCULAR
  Filled 2017-12-29: qty 1

## 2017-12-29 NOTE — ED Triage Notes (Signed)
While in triage, pt states pain in chest like there is something sitting on it. Does have hx of mi.

## 2017-12-29 NOTE — ED Triage Notes (Signed)
Has had a few syncopal episodes recently-Neurology is aware.

## 2017-12-29 NOTE — ED Provider Notes (Signed)
[facility] Emergency Department Provider Note  ____________________________________________   None    (approximate)  I have reviewed the triage vital signs and the nursing notes.   HISTORY  Chief Complaint Neck Pain and Chest Pain   HPI Sara Munoz is a 52 y.o. female who presents to the emergency department for treatment and evaluation of neck pain and chest pain.  Neck pain is chronic. She received an epidural injection todayin her lower cervical spine and has had an intense headache since that time.  She states that she is also had some chest pressure and cough, but that seems to be chronic as well.  She has had no diaphoresis, radiation of pain, or nausea.  She does have a history of myocardial infarction but this pain is not similar to her previous MI.  Patient is unable to tolerate NSAIDs due to to "liver and kidney issues."  She has taken ibuprofen in the past without any adverse reaction.  She has not notified her pain management provider of her current symptoms.  Past Medical History:  Diagnosis Date  . Anxiety   . Asthma   . Atypical chest pain 08/02/2015  . CAD (coronary artery disease)   . Chronic back pain   . COPD (chronic obstructive pulmonary disease) ([place])   . Coronary artery disease    a. Mild-non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/2014  . GERD (gastroesophageal reflux disease)   . Hypercholesteremia   . Hypertension   . Hypokalemia   . Hypomagnesemia 01/04/2014  . Liver disease   . MI (myocardial infarction) ([place])   . Opiate use 02/27/2016  . Osteoarthritis   . Stroke [facility])     Patient Active Problem List   Diagnosis Date Noted  . Paresthesia 08/29/2017  . Neck pain 08/29/2017  . Daily headache 08/29/2017  . Chronic active hepatitis ([place]) 07/29/2017  . Neurogenic pain 07/31/2016  . Chronic pain syndrome 06/05/2016  . Carotid artery stenosis 02/27/2016  . Chronic constipation 02/27/2016  . Chronic nausea 02/27/2016  .  Hypercholesterolemia 02/27/2016  . Osteoarthritis 02/27/2016  . PTSD (post-traumatic stress disorder) 02/27/2016  . TIA (transient ischemic attack) 02/27/2016  . Long term current use of opiate analgesic 02/27/2016  . Long term prescription opiate use 02/27/2016  . Encounter for pain management consult 02/27/2016  . Chronic hip pain (Location of Tertiary source of pain) (Bilateral) (L>R) 02/27/2016  . Chronic knee pain (Bilateral) (L>R) 02/27/2016  . Chronic shoulder pain (Bilateral) (L>R) 02/27/2016  . Chronic sacroiliac joint pain (Bilateral) (L>R) 02/27/2016  . Chronic low back pain (Location of Primary Source of Pain) (Bilateral) (L>R) 02/27/2016  . Chronic lower extremity pain (Location of Secondary source of pain) (Bilateral) (L>R) 02/27/2016  . Osteoarthritis of hip (Bilateral) (L>R) 02/27/2016  . Chronic neck pain (posterior midline) (Bilateral) (L>R) 02/27/2016  . Cervicogenic headache (Bilateral) (L>R) 02/27/2016  . Occipital headache (Bilateral) (L>R) 02/27/2016  . Chronic upper extremity pain (Bilateral) (L>R) 02/27/2016  . Chronic Cervical radicular pain (Bilateral) (L>R) 02/27/2016  . Chronic lumbar radicular pain (Right) (L5 dermatome) 02/27/2016  . Lumbar facet syndrome (Bilateral) (L>R) 02/27/2016  . Long term prescription benzodiazepine use 02/27/2016  . Hypertension   . COPD (chronic obstructive pulmonary disease) ([place]) 10/09/2015  . GERD (gastroesophageal reflux disease) 10/09/2015  . Non-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/2014 08/02/2015  . Hyperlipidemia 08/02/2015  . Costochondritis 08/02/2015  . 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 GAD (generalized anxiety disorder) 09/03/2014  . Narcotic dependence ([place]) 09/03/2014  .  PVD (peripheral vascular disease) ([place]) 09/03/2014  . Smoker 09/03/2014  . Cigarette nicotine dependence with nicotine-induced disorder 07/08/2014  . Anemia of chronic disease 03/19/2014  . Narcotic abuse, continuous ([place]) 03/19/2014  .  Polysubstance abuse ([place]) 03/19/2014  . MSSA (methicillin susceptible Staphylococcus aureus) septicemia ([place]) 03/07/2014  . Hypomagnesemia 01/04/2014  . Chronic cough 09/04/2011  . Sleep apnea, obstructive 09/04/2011    Past Surgical History:  Procedure Laterality Date  . CARDIAC CATHETERIZATION Left 02/22/2016   Procedure: Left Heart Cath and Coronary Angiography;  Surgeon: Yolonda Kida, MD;  Location: [place] [facility];  Service: Cardiovascular;  Laterality: Left;  . COLONOSCOPY WITH PROPOFOL N/A 09/19/2016   Procedure: COLONOSCOPY WITH PROPOFOL;  Surgeon: [name], MD;  Location: [facility] ENDOSCOPY;  Service: Endoscopy;  Laterality: N/A;  . ESOPHAGOGASTRODUODENOSCOPY (EGD) WITH PROPOFOL N/A 09/18/2017   Procedure: ESOPHAGOGASTRODUODENOSCOPY (EGD) WITH PROPOFOL;  Surgeon: [name], MD;  Location: [facility] ENDOSCOPY;  Service: Gastroenterology;  Laterality: N/A;  . hemorroids    . NASAL SINUS SURGERY    . ORIF FEMUR FRACTURE    . ORIF TIBIA & FIBULA FRACTURES    . OVARY SURGERY      Prior to Admission medications   Medication Sig Start Date End Date Taking? Authorizing Provider  albuterol (PROVENTIL HFA;VENTOLIN HFA) 108 (90 Base) MCG/ACT inhaler Inhale 2 puffs into the lungs every 6 (six) hours as needed for wheezing or shortness of breath. 05/26/17   [name], [name]  amLODipine (NORVASC) 5 MG tablet Take 1 tablet (5 mg total) by mouth daily. 05/26/17 05/26/18  [name], [name]  aspirin EC 81 MG tablet Take 81 mg by mouth daily.    [provider]  atorvastatin (LIPITOR) 20 MG tablet Take 20 mg by mouth at bedtime.    [provider]  budesonide-formoterol (SYMBICORT) 160-4.5 MCG/ACT inhaler Inhale 2 puffs into the lungs 2 (two) times daily. 05/26/17   Pollak, Wendee Beavers, [name]  CARAFATE 1 GM/10ML suspension TAKE 10 MLS (1 G TOTAL) BY MOUTH 4 (FOUR) TIMES DAILY. 12/16/17   [name], MD  docusate sodium (COLACE) 100 MG capsule Take 200 mg by mouth 2 (two)  times daily as needed for mild constipation.    [provider]  gabapentin (NEURONTIN) 600 MG tablet Take 1 tablet (600 mg total) by mouth 3 (three) times daily. 06/26/17 10/03/17  [name], [name]  hydrOXYzine (ATARAX/VISTARIL) 25 MG tablet Take by mouth.    [provider]  lisinopril (PRINIVIL,ZESTRIL) 40 MG tablet Take 1 tablet (40 mg total) by mouth daily. 08/05/17   [name], [name]  metoprolol tartrate (LOPRESSOR) 25 MG tablet Take 1 tablet (25 mg total) by mouth 2 (two) times daily. 08/05/17   [name], [name]  Naloxone HCl (NARCAN IJ) Inject as directed as needed.    [provider]  nitroGLYCERIN (NITROSTAT) 0.4 MG SL tablet Place 1 tablet (0.4 mg total) under the tongue every 5 (five) minutes x 3 doses as needed for chest pain. 08/03/15   Demetrios Loll, MD  omeprazole (PRILOSEC) 40 MG capsule Take 1 capsule (40 mg total) daily by mouth. 05/07/17   [name], MD  ondansetron (ZOFRAN-ODT) 8 MG disintegrating tablet TAKE 1 TABLET (8 MG TOTAL) BY MOUTH 2 (TWO) TIMES DAILY. 12/16/17   [name], [name]  oxyCODONE (OXY IR/ROXICODONE) 5 MG immediate release tablet Take 5 mg by mouth every 4 (four) hours as needed for severe pain.    [provider]  ranolazine (RANEXA) 500 MG 12 hr tablet Take 500 mg by mouth 2 (two) times daily.    [provider]  Tiotropium Bromide Monohydrate (SPIRIVA RESPIMAT) 2.5 MCG/ACT AERS Inhale 1 puff into the lungs daily. 05/26/17   [name], [name]  tiZANidine (ZANAFLEX) 2 MG tablet Take by mouth every 6 (six) hours as needed for muscle spasms.    [provider]  venlafaxine XR (EFFEXOR-XR) 37.5 MG 24 hr capsule Take 1 capsule (37.5 mg total) by mouth daily with breakfast. 08/05/17   Ursula Alert, MD  zolpidem (AMBIEN) 5 MG tablet Take 1 tablet (5 mg total) by mouth at bedtime as needed for sleep. 08/05/17   Ursula Alert, MD    Allergies Levofloxacin; Flexeril [cyclobenzaprine];  Keflex [cephalexin]; Ketorolac; Phenergan [promethazine hcl]; Toradol [ketorolac tromethamine]; Tramadol; and Zoloft [sertraline hcl]  Family History  Problem Relation Age of Onset  . Heart disease Mother   . Lung cancer Mother   . Ovarian cancer Mother   . Healthy Brother   . Healthy Brother   . Healthy Brother   . Diabetes Maternal Uncle   . Breast cancer Maternal Aunt     Social History Social History   Tobacco Use  . Smoking status: Current Every Day Smoker    Packs/day: 0.50    Types: Cigarettes  . Smokeless tobacco: Never Used  Substance Use Topics  . Alcohol use: No    Alcohol/week: 0.0 oz  . Drug use: Yes    Types: Marijuana    Comment: 2 months ago     Review of Systems  Constitutional: No fever/chills Eyes: No visual changes. ENT: No sore throat. Cardiovascular: Positive for chest pain. Respiratory: Denies shortness of breath. Gastrointestinal: No abdominal pain.  No nausea, no vomiting.  No diarrhea.  No constipation. Genitourinary: Negative for dysuria. Musculoskeletal: Positive for neck pain. Skin: Negative for rash. Neurological: Positive for headaches, negative for focal weakness or numbness. ____________________________________________   PHYSICAL EXAM:  VITAL SIGNS: ED Triage Vitals  Enc Vitals Group     BP 12/29/17 1955 (!) 160/97     Pulse Rate 12/29/17 1955 67     Resp 12/29/17 1955 19     Temp 12/29/17 1955 98.4 F (36.9 C)     Temp Source 12/29/17 1955 Oral     SpO2 12/29/17 1955 97 %     Weight 12/29/17 1956 180 lb (81.6 kg)     Height 12/29/17 1956 5\' 4"  (1.626 m)     Head Circumference --      Peak Flow --      Pain Score 12/29/17 2004 9     Pain Loc --      Pain Edu? --      Excl. in [place]? --     Constitutional: Alert and oriented. Uncomfortable appearing and in no acute distress. Eyes: Conjunctivae are normal. PERRL. Head: Atraumatic. Nose: No congestion/rhinnorhea. Mouth/Throat: Mucous membranes are moist.  Oropharynx  non-erythematous. Neck: No stridor.   Cardiovascular: Normal rate, regular rhythm. Grossly normal heart sounds.  Good peripheral circulation. Respiratory: Normal respiratory effort.  No retractions. Lungs CTAB. Gastrointestinal: Soft and nontender. No distention. No abdominal bruits. No CVA tenderness. Musculoskeletal: No lower extremity tenderness nor edema.  No joint effusions. Neurologic:  Normal speech and language. No gross focal neurologic deficits are appreciated. No gait instability. Skin:  Skin is warm, dry and intact. No rash noted. Psychiatric: Mood and affect are normal. Speech and behavior are normal.  ____________________________________________   LABS (  all labs ordered are listed, but only abnormal results are displayed)  Labs Reviewed  BASIC METABOLIC PANEL - Abnormal; Notable for the following components:      Result Value   Sodium 134 (*)    Calcium 8.8 (*)    All other components within normal limits  CBC - Abnormal; Notable for the following components:   RDW 17.5 (*)    All other components within normal limits  TROPONIN I   ____________________________________________  EKG   Date: 12/30/2017  Rate: 67  Rhythm: normal sinus rhythm  QRS Axis: normal  Intervals: normal  ST/T Wave abnormalities: normal  Conduction Disutrbances: none  Narrative Interpretation: Normal sinus rhythm  Old EKG Reviewed: No significant changes noted    ____________________________________________  RADIOLOGY  ED MD interpretation: Chest x-ray negative for acute cardiopulmonary abnormality per radiology.  Official radiology report(s): No results found.  ____________________________________________   PROCEDURES  Procedure(s) performed: None  Procedures  Critical Care performed: No  ____________________________________________   INITIAL IMPRESSION / ASSESSMENT AND PLAN / ED COURSE  As part of my medical decision making, I reviewed the following data within the  electronic MEDICAL RECORD NUMBER Notes from prior ED visits and [place] Controlled Substance Database   52 year old female presenting to the emergency department for treatment and evaluation of neck pain after a steroid injection in the lower cervical spine earlier today.  She also states that she had some burning type chest pain with cough that started about the same time.  Labs, EKG, chest x-ray are all reassuring.  While here, the patient was given 1 mg of Dilaudid and 5 mg of Valium with little improvement in her headache.  Since the patient does not have anaphylaxis to NSAIDs, she will be given Aleve and advised to take it for 1 week or less to help alleviate current symptoms.  She is under a pain contract and will not receive a prescription for any types of controlled substances tonight.  She is to call her pain management provider in the morning and discuss additional pain management options.  She was advised to follow-up with her primary care provider if the headache lasts for more than 24 hours.  She was encouraged to return to the emergency department for symptoms of change or worsen she is unable to schedule appointment.      ____________________________________________   FINAL CLINICAL IMPRESSION(S) / ED DIAGNOSES  Final diagnoses:  Neck pain  Other headache syndrome     ED Discharge Orders    None       Note:  This document was prepared using Dragon voice recognition software and may include unintentional dictation errors.    Victorino Dike, FNP 12/30/17 2153    Loney Hering, MD 01/03/18 [phone]

## 2017-12-29 NOTE — ED Triage Notes (Signed)
Pt states she got an epidural in her upper back for herniated disc today for her back pain, states trying to find a solution other than surgery, states Neuro appoint in mid Aug, does go to pain clinic, states the epidural shot has made her hurt worse than before. Pt walked to triage.

## 2017-12-30 MED ORDER — DIAZEPAM 5 MG PO TABS
5.0000 mg | ORAL_TABLET | Freq: Once | ORAL | Status: AC
  Administered 2017-12-30: 5 mg via ORAL
  Filled 2017-12-30: qty 1

## 2017-12-30 MED ORDER — NAPROXEN 500 MG PO TABS
500.0000 mg | ORAL_TABLET | Freq: Once | ORAL | Status: AC
  Administered 2017-12-30: 500 mg via ORAL
  Filled 2017-12-30: qty 1

## 2017-12-30 NOTE — ED Notes (Signed)
Pt states that she has a herniated disc in her back. She went to pain doctor on yesterday and they gave her a steroid injection in her neck and it's been hurting even worse since then,.

## 2018-01-11 ENCOUNTER — Other Ambulatory Visit: Payer: Self-pay

## 2018-01-11 ENCOUNTER — Encounter: Payer: Self-pay | Admitting: Emergency Medicine

## 2018-01-11 ENCOUNTER — Emergency Department
Admission: EM | Admit: 2018-01-11 | Discharge: 2018-01-11 | Disposition: A | Discharge status: Home / Self Care | Payer: Medicaid Other | Attending: Emergency Medicine | Admitting: Emergency Medicine

## 2018-01-11 ENCOUNTER — Emergency Department: Payer: Medicaid Other

## 2018-01-11 DIAGNOSIS — R110 Nausea: Secondary | ICD-10-CM | POA: Diagnosis not present

## 2018-01-11 DIAGNOSIS — R0602 Shortness of breath: Secondary | ICD-10-CM | POA: Diagnosis not present

## 2018-01-11 DIAGNOSIS — Z7982 Long term (current) use of aspirin: Secondary | ICD-10-CM | POA: Diagnosis not present

## 2018-01-11 DIAGNOSIS — Z79899 Other long term (current) drug therapy: Secondary | ICD-10-CM | POA: Diagnosis not present

## 2018-01-11 DIAGNOSIS — R079 Chest pain, unspecified: Secondary | ICD-10-CM | POA: Diagnosis not present

## 2018-01-11 DIAGNOSIS — I2510 Atherosclerotic heart disease of native coronary artery without angina pectoris: Secondary | ICD-10-CM | POA: Diagnosis not present

## 2018-01-11 DIAGNOSIS — I10 Essential (primary) hypertension: Secondary | ICD-10-CM | POA: Diagnosis not present

## 2018-01-11 DIAGNOSIS — J449 Chronic obstructive pulmonary disease, unspecified: Secondary | ICD-10-CM | POA: Insufficient documentation

## 2018-01-11 DIAGNOSIS — F1210 Cannabis abuse, uncomplicated: Secondary | ICD-10-CM | POA: Diagnosis not present

## 2018-01-11 DIAGNOSIS — M545 Low back pain: Secondary | ICD-10-CM | POA: Diagnosis not present

## 2018-01-11 DIAGNOSIS — F17210 Nicotine dependence, cigarettes, uncomplicated: Secondary | ICD-10-CM | POA: Insufficient documentation

## 2018-01-11 DIAGNOSIS — G8929 Other chronic pain: Secondary | ICD-10-CM

## 2018-01-11 DIAGNOSIS — M549 Dorsalgia, unspecified: Secondary | ICD-10-CM | POA: Diagnosis present

## 2018-01-11 DIAGNOSIS — I252 Old myocardial infarction: Secondary | ICD-10-CM | POA: Diagnosis not present

## 2018-01-11 DIAGNOSIS — Z8673 Personal history of transient ischemic attack (TIA), and cerebral infarction without residual deficits: Secondary | ICD-10-CM | POA: Insufficient documentation

## 2018-01-11 LAB — BASIC METABOLIC PANEL
Anion gap: 12 (ref 5–15)
BUN: 11 mg/dL (ref 6–20)
CO2: 19 mmol/L — ABNORMAL LOW (ref 22–32)
Calcium: 9 mg/dL (ref 8.9–10.3)
Chloride: 102 mmol/L (ref 98–111)
Creatinine, Ser: 0.7 mg/dL (ref 0.44–1.00)
GFR calc Af Amer: >60 mL/min (ref >60)
GFR calc non Af Amer: >60 mL/min (ref >60)
Glucose, Bld: 107 mg/dL — ABNORMAL HIGH (ref 70–99)
Potassium: 3.1 mmol/L — ABNORMAL LOW (ref 3.5–5.1)
Sodium: 133 mmol/L — ABNORMAL LOW (ref 135–145)

## 2018-01-11 LAB — CBC
HCT: 40.2 % (ref 35.0–47.0)
Hemoglobin: 13.6 g/dL (ref 12.0–16.0)
MCH: 28.7 pg (ref 26.0–34.0)
MCHC: 33.9 g/dL (ref 32.0–36.0)
MCV: 84.6 fL (ref 80.0–100.0)
Platelets: 205 10*3/uL (ref 150–440)
RBC: 4.75 MIL/uL (ref 3.80–5.20)
RDW: 17.5 % — ABNORMAL HIGH (ref 11.5–14.5)
WBC: 12.8 10*3/uL — ABNORMAL HIGH (ref 3.6–11.0)

## 2018-01-11 LAB — TROPONIN I: Troponin I: <0.03 ng/mL (ref <0.03)

## 2018-01-11 MED ORDER — PREDNISONE 20 MG PO TABS
60.0000 mg | ORAL_TABLET | Freq: Once | ORAL | Status: AC
  Administered 2018-01-11: 60 mg via ORAL
  Filled 2018-01-11: qty 6

## 2018-01-11 MED ORDER — MORPHINE SULFATE (PF) 4 MG/ML IV SOLN
4.0000 mg | Freq: Once | INTRAVENOUS | Status: AC
  Administered 2018-01-11: 4 mg via INTRAMUSCULAR
  Filled 2018-01-11: qty 1

## 2018-01-11 NOTE — ED Notes (Signed)
Patent with complaint of chronic back pain that has been worse over the last two days after moving furniture. Patient states that she is followed at a pain clinic and takes Oxycodone 5 mg for her pain. Patient states that she had chest pain and shortness of breath earlier in the week but not currently.

## 2018-01-11 NOTE — ED Triage Notes (Signed)
Pt comes into the ED via POV c/o lower back pain and chest pain.  Patient states she has chronic back pain and that the chest pain started a couple of days ago.  Patient in NAD at this time with even ad unlabored respirations.  Patient states she is having N/V, shortness of breath, and dizziness today.  Patient does not present diaphoretic at this time.

## 2018-01-11 NOTE — ED Provider Notes (Signed)
[facility] Emergency Department Provider Note  Time seen: 9:26 PM  I have reviewed the triage vital signs and the nursing notes.   HISTORY  Chief Complaint Back Pain and Chest Pain    HPI Sara Munoz is a 52 y.o. female with a past medical history of anxiety, asthma, CAD, COPD, gastric reflux, hypertension, hyperlipidemia, MI, chronic back and neck pain presents to the emergency department for back and neck pain.  According to the patient she is moving furniture around 3 days ago, ever since she has had significant back pain and neck pain.  States she is on chronic medications from pain management including oxycodone.  States the pain medicine has not been enough and today she could not get relief from the pain so she came to the emergency department.  She also states in review of systems that she was having chest pain 3 days ago but has not experienced any since.  Does state it was a moderate pressure sensation 3 days ago along with some shortness of breath and nausea.  Has been chest pain-free since.  Describes her back pain as severe dull aching pain worse with any movement neck pain is severe pain worse with movement.     Past Medical History:  Diagnosis Date  . Anxiety   . Asthma   . Atypical chest pain 08/02/2015  . CAD (coronary artery disease)   . Chronic back pain   . COPD (chronic obstructive pulmonary disease) ([place])   . Coronary artery disease    a. Mild-non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/2014  . GERD (gastroesophageal reflux disease)   . Hypercholesteremia   . Hypertension   . Hypokalemia   . Hypomagnesemia 01/04/2014  . Liver disease   . MI (myocardial infarction) ([place])   . Opiate use 02/27/2016  . Osteoarthritis   . Stroke [facility])     Patient Active Problem List   Diagnosis Date Noted  . Paresthesia 08/29/2017  . Neck pain 08/29/2017  . Daily headache 08/29/2017  . Chronic active hepatitis ([place]) 07/29/2017  . Neurogenic pain 07/31/2016  .  Chronic pain syndrome 06/05/2016  . Carotid artery stenosis 02/27/2016  . Chronic constipation 02/27/2016  . Chronic nausea 02/27/2016  . Hypercholesterolemia 02/27/2016  . Osteoarthritis 02/27/2016  . PTSD (post-traumatic stress disorder) 02/27/2016  . TIA (transient ischemic attack) 02/27/2016  . Long term current use of opiate analgesic 02/27/2016  . Long term prescription opiate use 02/27/2016  . Encounter for pain management consult 02/27/2016  . Chronic hip pain (Location of Tertiary source of pain) (Bilateral) (L>R) 02/27/2016  . Chronic knee pain (Bilateral) (L>R) 02/27/2016  . Chronic shoulder pain (Bilateral) (L>R) 02/27/2016  . Chronic sacroiliac joint pain (Bilateral) (L>R) 02/27/2016  . Chronic low back pain (Location of Primary Source of Pain) (Bilateral) (L>R) 02/27/2016  . Chronic lower extremity pain (Location of Secondary source of pain) (Bilateral) (L>R) 02/27/2016  . Osteoarthritis of hip (Bilateral) (L>R) 02/27/2016  . Chronic neck pain (posterior midline) (Bilateral) (L>R) 02/27/2016  . Cervicogenic headache (Bilateral) (L>R) 02/27/2016  . Occipital headache (Bilateral) (L>R) 02/27/2016  . Chronic upper extremity pain (Bilateral) (L>R) 02/27/2016  . Chronic Cervical radicular pain (Bilateral) (L>R) 02/27/2016  . Chronic lumbar radicular pain (Right) (L5 dermatome) 02/27/2016  . Lumbar facet syndrome (Bilateral) (L>R) 02/27/2016  . Long term prescription benzodiazepine use 02/27/2016  . Hypertension   . COPD (chronic obstructive pulmonary disease) ([place]) 10/09/2015  . GERD (gastroesophageal reflux disease) 10/09/2015  . Non-obstructive CAD by  cath in 06/2014 08/02/2015  . Hyperlipidemia 08/02/2015  . Costochondritis 08/02/2015  . 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 GAD (generalized anxiety disorder) 09/03/2014  . Narcotic dependence ([place]) 09/03/2014  . PVD (peripheral vascular disease) ([place]) 09/03/2014  . Smoker 09/03/2014  . Cigarette nicotine dependence with  nicotine-induced disorder 07/08/2014  . Anemia of chronic disease 03/19/2014  . Narcotic abuse, continuous ([place]) 03/19/2014  . Polysubstance abuse ([place]) 03/19/2014  . MSSA (methicillin susceptible Staphylococcus aureus) septicemia ([place]) 03/07/2014  . Hypomagnesemia 01/04/2014  . Chronic cough 09/04/2011  . Sleep apnea, obstructive 09/04/2011    Past Surgical History:  Procedure Laterality Date  . CARDIAC CATHETERIZATION Left 02/22/2016   Procedure: Left Heart Cath and Coronary Angiography;  Surgeon: Yolonda Kida, MD;  Location: [place] [facility];  Service: Cardiovascular;  Laterality: Left;  . COLONOSCOPY WITH PROPOFOL N/A 09/19/2016   Procedure: COLONOSCOPY WITH PROPOFOL;  Surgeon: [name], MD;  Location: [facility] ENDOSCOPY;  Service: Endoscopy;  Laterality: N/A;  . ESOPHAGOGASTRODUODENOSCOPY (EGD) WITH PROPOFOL N/A 09/18/2017   Procedure: ESOPHAGOGASTRODUODENOSCOPY (EGD) WITH PROPOFOL;  Surgeon: [name], MD;  Location: [facility] ENDOSCOPY;  Service: Gastroenterology;  Laterality: N/A;  . hemorroids    . NASAL SINUS SURGERY    . ORIF FEMUR FRACTURE    . ORIF TIBIA & FIBULA FRACTURES    . OVARY SURGERY      Prior to Admission medications   Medication Sig Start Date End Date Taking? Authorizing Provider  albuterol (PROVENTIL HFA;VENTOLIN HFA) 108 (90 Base) MCG/ACT inhaler Inhale 2 puffs into the lungs every 6 (six) hours as needed for wheezing or shortness of breath. 05/26/17   [name], [name]  amLODipine (NORVASC) 5 MG tablet Take 1 tablet (5 mg total) by mouth daily. 05/26/17 05/26/18  [name], [name]  aspirin EC 81 MG tablet Take 81 mg by mouth daily.    [provider]  atorvastatin (LIPITOR) 20 MG tablet Take 20 mg by mouth at bedtime.    [provider]  budesonide-formoterol (SYMBICORT) 160-4.5 MCG/ACT inhaler Inhale 2 puffs into the lungs 2 (two) times daily. 05/26/17   Pollak, Wendee Beavers, [name]  CARAFATE 1 GM/10ML suspension TAKE 10 MLS (1 G  TOTAL) BY MOUTH 4 (FOUR) TIMES DAILY. 12/16/17   [name], MD  docusate sodium (COLACE) 100 MG capsule Take 200 mg by mouth 2 (two) times daily as needed for mild constipation.    [provider]  gabapentin (NEURONTIN) 600 MG tablet Take 1 tablet (600 mg total) by mouth 3 (three) times daily. 06/26/17 10/03/17  [name], [name]  hydrOXYzine (ATARAX/VISTARIL) 25 MG tablet Take by mouth.    [provider]  lisinopril (PRINIVIL,ZESTRIL) 40 MG tablet Take 1 tablet (40 mg total) by mouth daily. 08/05/17   [name], [name]  metoprolol tartrate (LOPRESSOR) 25 MG tablet Take 1 tablet (25 mg total) by mouth 2 (two) times daily. 08/05/17   [name], [name]  Naloxone HCl (NARCAN IJ) Inject as directed as needed.    [provider]  nitroGLYCERIN (NITROSTAT) 0.4 MG SL tablet Place 1 tablet (0.4 mg total) under the tongue every 5 (five) minutes x 3 doses as needed for chest pain. 08/03/15   Demetrios Loll, MD  omeprazole (PRILOSEC) 40 MG capsule Take 1 capsule (40 mg total) daily by mouth. 05/07/17   [name], MD  ondansetron (ZOFRAN-ODT) 8 MG disintegrating tablet TAKE 1 TABLET (8 MG TOTAL) BY MOUTH 2 (TWO) TIMES DAILY. 12/16/17  [name], [name]  oxyCODONE (OXY IR/ROXICODONE) 5 MG immediate release tablet Take 5 mg by mouth every 4 (four) hours as needed for severe pain.    [provider]  ranolazine (RANEXA) 500 MG 12 hr tablet Take 500 mg by mouth 2 (two) times daily.    [provider]  Tiotropium Bromide Monohydrate (SPIRIVA RESPIMAT) 2.5 MCG/ACT AERS Inhale 1 puff into the lungs daily. 05/26/17   [name], [name]  tiZANidine (ZANAFLEX) 2 MG tablet Take by mouth every 6 (six) hours as needed for muscle spasms.    [provider]  venlafaxine XR (EFFEXOR-XR) 37.5 MG 24 hr capsule Take 1 capsule (37.5 mg total) by mouth daily with breakfast. 08/05/17   Ursula Alert, MD  zolpidem (AMBIEN) 5 MG tablet Take 1 tablet (5 mg  total) by mouth at bedtime as needed for sleep. 08/05/17   Ursula Alert, MD    Allergies  Allergen Reactions  . Levofloxacin Hives and Swelling  . Flexeril [Cyclobenzaprine] Hives, Swelling and Other (See Comments)    Reaction:  Facial/lip swelling  Reaction:  Facial/lip swelling   . Keflex [Cephalexin] Hives  . Ketorolac   . Phenergan [Promethazine Hcl] Other (See Comments)    Agitation.   . Toradol [Ketorolac Tromethamine] Swelling and Other (See Comments)    Reaction:  Facial/tongue swelling  Reaction:  Facial/tongue swelling   . Tramadol Hives, Swelling and Other (See Comments)    Reaction:  Lip swelling  Reaction:  Lip swelling   . Zoloft [Sertraline Hcl] Swelling and Other (See Comments)    Reaction:  Tongue swelling  Reaction:  Tongue swelling     Family History  Problem Relation Age of Onset  . Heart disease Mother   . Lung cancer Mother   . Ovarian cancer Mother   . Healthy Brother   . Healthy Brother   . Healthy Brother   . Diabetes Maternal Uncle   . Breast cancer Maternal Aunt     Social History Social History   Tobacco Use  . Smoking status: Current Every Day Smoker    Packs/day: 0.50    Types: Cigarettes  . Smokeless tobacco: Never Used  Substance Use Topics  . Alcohol use: No    Alcohol/week: 0.0 oz  . Drug use: Yes    Types: Marijuana    Comment: 2 months ago     Review of Systems Constitutional: Negative for fever Cardiovascular: Chest pain 3 days ago, none since Respiratory: Negative for shortness of breath. Gastrointestinal: Negative for abdominal pain Genitourinary: Negative for urinary compaints Musculoskeletal: Neck pain and lower back pain, chronic but worse today. Skin: Negative for skin complaints  Neurological: Negative for headache All other ROS negative  ____________________________________________   PHYSICAL EXAM:  VITAL SIGNS: ED Triage Vitals [01/11/18 2020]  Enc Vitals Group     BP (!) 122/101     Pulse Rate 79      Resp 16     Temp 98.4 F (36.9 C)     Temp Source Oral     SpO2 95 %     Weight 180 lb (81.6 kg)     Height 5\' 4"  (1.626 m)     Head Circumference      Peak Flow      Pain Score 10     Pain Loc      Pain Edu?      Excl. in [place]?     Constitutional: Alert and oriented. Well appearing and in  no distress. Eyes: Normal exam ENT   Head: Normocephalic and atraumatic.   Mouth/Throat: Mucous membranes are moist. Cardiovascular: Normal rate, regular rhythm. No murmur Respiratory: Normal respiratory effort without tachypnea nor retractions. Breath sounds are clear Gastrointestinal: Soft and nontender. No distention.   Musculoskeletal: Moderate cervical and lumbar tenderness to palpation. Neurologic:  Normal speech and language. No gross focal neurologic deficits Skin:  Skin is warm, dry and intact.  Psychiatric: Mood and affect are normal.   ____________________________________________    EKG  EKG reviewed and interpreted by myself shows normal sinus rhythm at 73 bpm with a narrow QRS, normal axis, normal intervals, no ST changes.  Normal EKG.  ____________________________________________    RADIOLOGY  Chest x-ray shows no edema or consolidation.  ____________________________________________   INITIAL IMPRESSION / ASSESSMENT AND PLAN / ED COURSE  Pertinent labs & imaging results that were available during my care of the patient were reviewed by me and considered in my medical decision making (see chart for details).  She presents the emergency department for worsening neck and back pain over the past 3 days since moving furniture at home.  Differential would include worsening muscular skeletal pain, sciatica, radicular pain, neuropathic pain.  Patient currently sees pain management is not due for an appointment for the next 5 days.  I discussed with the patient we cannot prescribe medications to go home with, patient is agreeable to this and states she is under a pain  contract anyways.  We will dose a one-time dose of pain medication in the emergency department as well as start the patient on a burst course of steroids.  Patient will follow-up with her doctor this week as scheduled.  Patient agreeable to plan of care.  ____________________________________________   FINAL CLINICAL IMPRESSION(S) / ED DIAGNOSES  Back pain     Harvest Dark, MD 01/11/18 2129

## 2018-01-11 NOTE — ED Notes (Signed)
Patient requesting to be discharged. Md notified.

## 2018-01-11 NOTE — ED Notes (Signed)
Patient given crackers at this time.

## 2018-01-14 ENCOUNTER — Ambulatory Visit: Payer: Self-pay | Admitting: Physician Assistant

## 2018-01-14 DIAGNOSIS — M25511 Pain in right shoulder: Secondary | ICD-10-CM | POA: Diagnosis not present

## 2018-01-14 DIAGNOSIS — M79662 Pain in left lower leg: Secondary | ICD-10-CM | POA: Diagnosis not present

## 2018-01-14 DIAGNOSIS — M25512 Pain in left shoulder: Secondary | ICD-10-CM | POA: Diagnosis not present

## 2018-01-14 DIAGNOSIS — M79661 Pain in right lower leg: Secondary | ICD-10-CM | POA: Diagnosis not present

## 2018-01-15 ENCOUNTER — Ambulatory Visit (INDEPENDENT_AMBULATORY_CARE_PROVIDER_SITE_OTHER): Payer: Medicaid Other | Admitting: Physician Assistant

## 2018-01-15 DIAGNOSIS — K732 Chronic active hepatitis, not elsewhere classified: Secondary | ICD-10-CM | POA: Diagnosis not present

## 2018-01-15 DIAGNOSIS — Z23 Encounter for immunization: Secondary | ICD-10-CM

## 2018-01-19 ENCOUNTER — Other Ambulatory Visit: Payer: Self-pay | Admitting: Physician Assistant

## 2018-01-19 DIAGNOSIS — R110 Nausea: Secondary | ICD-10-CM

## 2018-01-19 DIAGNOSIS — I10 Essential (primary) hypertension: Secondary | ICD-10-CM

## 2018-01-21 NOTE — Telephone Encounter (Signed)
Pt contacted office for refill request on the following medications:  1. ondansetron (ZOFRAN-ODT) 8 MG disintegrating tablet  2. metoprolol tartrate (LOPRESSOR) 25 MG tablet  Pt is also requesting that the patches to help her stop smoking that were discuss at her LOV be sent into the pharmacy as well.   [facility] Please advise. Thanks TNP

## 2018-01-22 NOTE — Telephone Encounter (Signed)
Patient advised. Follow up scheduled for 02/20/18

## 2018-01-22 NOTE — Telephone Encounter (Signed)
Haven't seen her in six months, needs follow up OV and we can discuss then.

## 2018-01-23 DIAGNOSIS — M542 Cervicalgia: Secondary | ICD-10-CM | POA: Diagnosis not present

## 2018-01-29 DIAGNOSIS — R011 Cardiac murmur, unspecified: Secondary | ICD-10-CM | POA: Diagnosis not present

## 2018-01-29 DIAGNOSIS — I208 Other forms of angina pectoris: Secondary | ICD-10-CM | POA: Diagnosis not present

## 2018-01-29 DIAGNOSIS — R55 Syncope and collapse: Secondary | ICD-10-CM | POA: Diagnosis not present

## 2018-01-29 DIAGNOSIS — R0602 Shortness of breath: Secondary | ICD-10-CM | POA: Diagnosis not present

## 2018-02-04 ENCOUNTER — Other Ambulatory Visit: Payer: Self-pay | Admitting: Internal Medicine

## 2018-02-04 DIAGNOSIS — R0602 Shortness of breath: Secondary | ICD-10-CM

## 2018-02-04 DIAGNOSIS — I208 Other forms of angina pectoris: Secondary | ICD-10-CM

## 2018-02-11 DIAGNOSIS — M545 Low back pain: Secondary | ICD-10-CM | POA: Diagnosis not present

## 2018-02-11 DIAGNOSIS — M79621 Pain in right upper arm: Secondary | ICD-10-CM | POA: Diagnosis not present

## 2018-02-11 DIAGNOSIS — G894 Chronic pain syndrome: Secondary | ICD-10-CM | POA: Diagnosis not present

## 2018-02-11 DIAGNOSIS — M25511 Pain in right shoulder: Secondary | ICD-10-CM | POA: Diagnosis not present

## 2018-02-11 DIAGNOSIS — M79662 Pain in left lower leg: Secondary | ICD-10-CM | POA: Diagnosis not present

## 2018-02-11 DIAGNOSIS — M79622 Pain in left upper arm: Secondary | ICD-10-CM | POA: Diagnosis not present

## 2018-02-11 DIAGNOSIS — M25512 Pain in left shoulder: Secondary | ICD-10-CM | POA: Diagnosis not present

## 2018-02-12 ENCOUNTER — Encounter

## 2018-02-12 ENCOUNTER — Ambulatory Visit: Payer: Medicaid Other | Admitting: Neurology

## 2018-02-12 ENCOUNTER — Encounter: Payer: Self-pay | Admitting: Neurology

## 2018-02-12 VITALS — BP 139/85 | HR 66 | Ht 64.0 in | Wt 182.5 lb

## 2018-02-12 DIAGNOSIS — M5412 Radiculopathy, cervical region: Secondary | ICD-10-CM | POA: Diagnosis not present

## 2018-02-12 DIAGNOSIS — G43709 Chronic migraine without aura, not intractable, without status migrainosus: Secondary | ICD-10-CM | POA: Insufficient documentation

## 2018-02-12 DIAGNOSIS — IMO0002 Reserved for concepts with insufficient information to code with codable children: Secondary | ICD-10-CM

## 2018-02-12 DIAGNOSIS — M542 Cervicalgia: Secondary | ICD-10-CM

## 2018-02-12 MED ORDER — ONDANSETRON 8 MG PO TBDP: 8.0000 mg | ORAL_TABLET | Freq: Two times a day (BID) | ORAL | 6 refills | Status: DC

## 2018-02-12 MED ORDER — RIZATRIPTAN BENZOATE 10 MG PO TBDP: 10.0000 mg | ORAL_TABLET | ORAL | 6 refills | Status: DC | PRN

## 2018-02-12 MED ORDER — DULOXETINE HCL 60 MG PO CPEP: 60.0000 mg | ORAL_CAPSULE | Freq: Every day | ORAL | 12 refills | Status: DC

## 2018-02-12 NOTE — Progress Notes (Signed)
PATIENT: Sara Munoz DOB: Jun 06, 1966  Chief Complaint  Patient presents with  . Neck Pain    She is here for worsenin neck pain.  Reports numbness/tingling in her bilateral arms.  Pain is causing daily headaches.  She is under the care of  [facility] and is currenlty taking ocycodone 5mg , one tablet four times daily.  At this time, they are holding off on increasing her dosage.      HISTORICAL  Sara Munoz is a 52 year old female, seen in refer by primary care PA Carles Collet for evaluation of paresthesia, persistent headaches, initial evaluation was on August 29, 2017.  I reviewed and summarized the referring note, she had hypertension, hyperlipidemia, COPD, reported coronary artery disease, heart attack, stroke in the past, hepatitis C, is receiving treatment, will finish in March 2019, she has been on disability.  On polypharmacy treatment.  Since summer 2018, she reported gradual onset worsening neck pain, radiating paresthesia along her spine and 4 extremity with neck movement, gradually getting worse over the past few months, also noticed spreading of neck pain to her vortex area, almost daily pressure headaches, she did have a history of migraine in the past, but this headaches are persistent, moderate degree on a daily basis.  She has tried aspirin, NSAIDs with limited help, she denies bowel and bladder incontinence, no significant gait abnormality.  She reported few episode of passing out since 2018, most recent one in January 2019, woke up in the middle of the night using bathroom, no warning signs, woke up on the floor, with bruising her face, whole body achy pain, urinary incontinence.  Reviewed CT head without contrast November 2018, no acute abnormality, CT cervical spine showed multilevel degenerative disc disease, with mild to moderate canal stenosis C6-C7.  Laboratory evaluation seen November 2018, normal CBC, BMP.  UPDATE October 01 2017: Patient  complains 6 months history of constant lower neck pain, radiating along her spine, often spreading forward, headache, she is under pain management at [facility], is planning on to have cervical epidural injection, also on narcotic treatment,  I have personally reviewed MRI of the brain, no acute intracranial abnormality, mild supratentorium small vessel disease.  MRI of cervical spine, central disc protrusion at C6 and 7, with mild to moderate canal stenosis, cord flattening, canal diameter 6 mm, but no cord signal change, and also moderate bilateral foraminal narrowing,  Electrodiagnostic study October 01 2017 Showed Mild Right Carpal Tunl. syndromes, there is no significant cervical radiculopathies,  On examinations, there is no evidence of muscle weakness, or cervical myelopathy.  UPDATE February 12 2018: She continue complains of significant daily severe neck pain, radiating pain to bilateral shoulder, more to right side, has right arm numbness tingling, her neck pain also spreading forward to become the headache with associated light noise sensitivity, she also complains of imbalance, bladder urgency,  We personally reviewed MRI of the cervical spine in March 2019, central disc protrusion at C6-7, with moderate canal stenosis, flattening of the spinal cord, canal diameter 6 mm, no cord abnormal signal changes, left greater than right C7 foraminal narrowing,  MRI of the brain showed no acute intracranial abnormality  EMG nerve conduction study in April 2019 showed mild bilateral C7 radiculopathy, mild right carpal tunnel syndrome.  laboratory evaluations in July 2019, negative troponin, mild elevated WBC 12.8, hemoglobin of 13.6, BMP showed low potassium 3.1, creatinine of 0.7,  Over the years she has tried different medications including gabapentin  600 mg 3 times a day, is under pain management, taking oxycodone without controlling her neck pain, frequent headaches,   REVIEW OF  SYSTEMS: Full 14 system review of systems performed and notable only for: Activity change, fatigue, ringing the ears, trouble swallowing cough, wheezing, shortness of breath, chest tightness, chest pain, cold, heat intolerance, excessive thirst, abdominal pain, diarrhea, nausea, restless leg, frequent awakening, bladder, frequent urination, back pain, walking difficulty, neck pain, stiffness, bruise easily, dizziness, headaches, numbness, weakness, passing out, nervousness  ALLERGIES: Allergies  Allergen Reactions  . Levofloxacin Hives and Swelling  . Flexeril [Cyclobenzaprine] Hives, Swelling and Other (See Comments)    Reaction:  Facial/lip swelling  Reaction:  Facial/lip swelling   . Keflex [Cephalexin] Hives  . Ketorolac   . Phenergan [Promethazine Hcl] Other (See Comments)    Agitation.   . Toradol [Ketorolac Tromethamine] Swelling and Other (See Comments)    Reaction:  Facial/tongue swelling  Reaction:  Facial/tongue swelling   . Tramadol Hives, Swelling and Other (See Comments)    Reaction:  Lip swelling  Reaction:  Lip swelling   . Zoloft [Sertraline Hcl] Swelling and Other (See Comments)    Reaction:  Tongue swelling  Reaction:  Tongue swelling     HOME MEDICATIONS: Current Outpatient Medications  Medication Sig Dispense Refill  . albuterol (PROVENTIL HFA;VENTOLIN HFA) 108 (90 Base) MCG/ACT inhaler Inhale 2 puffs into the lungs every 6 (six) hours as needed for wheezing or shortness of breath. 1 Inhaler 2  . aspirin EC 81 MG tablet Take 81 mg by mouth daily.    Marland Kitchen atorvastatin (LIPITOR) 20 MG tablet Take 20 mg by mouth at bedtime.    . budesonide-formoterol (SYMBICORT) 160-4.5 MCG/ACT inhaler Inhale 2 puffs into the lungs 2 (two) times daily. 1 Inhaler 0  . CARAFATE 1 GM/10ML suspension TAKE 10 MLS (1 G TOTAL) BY MOUTH 4 (FOUR) TIMES DAILY. 420 mL 1  . docusate sodium (COLACE) 100 MG capsule Take 200 mg by mouth 2 (two) times daily as needed for mild constipation.    Marland Kitchen  lisinopril (PRINIVIL,ZESTRIL) 40 MG tablet Take 1 tablet (40 mg total) by mouth daily. 90 tablet 1  . metoprolol tartrate (LOPRESSOR) 25 MG tablet TAKE 1 TABLET BY MOUTH TWICE A DAY 180 tablet 1  . Naloxone HCl (NARCAN IJ) Inject as directed as needed.    . nitroGLYCERIN (NITROSTAT) 0.4 MG SL tablet Place 1 tablet (0.4 mg total) under the tongue every 5 (five) minutes x 3 doses as needed for chest pain. 30 tablet 2  . omeprazole (PRILOSEC) 40 MG capsule Take 1 capsule (40 mg total) daily by mouth. 90 capsule 3  . ondansetron (ZOFRAN-ODT) 8 MG disintegrating tablet TAKE 1 TABLET (8 MG TOTAL) BY MOUTH 2 (TWO) TIMES DAILY. 20 tablet 0  . oxyCODONE (OXY IR/ROXICODONE) 5 MG immediate release tablet Take 5 mg by mouth every 4 (four) hours as needed for severe pain.    . Tiotropium Bromide Monohydrate (SPIRIVA RESPIMAT) 2.5 MCG/ACT AERS Inhale 1 puff into the lungs daily. 4 g 2  . tiZANidine (ZANAFLEX) 2 MG tablet Take by mouth every 6 (six) hours as needed for muscle spasms.    Marland Kitchen gabapentin (NEURONTIN) 600 MG tablet Take 1 tablet (600 mg total) by mouth 3 (three) times daily. 90 tablet 2   No current facility-administered medications for this visit.     PAST MEDICAL HISTORY: Past Medical History:  Diagnosis Date  . Anxiety   . Asthma   . Atypical chest  pain 08/02/2015  . CAD (coronary artery disease)   . Chronic back pain   . COPD (chronic obstructive pulmonary disease) ([place])   . Coronary artery disease    a. Mild-non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/2014  . GERD (gastroesophageal reflux disease)   . Hypercholesteremia   . Hypertension   . Hypokalemia   . Hypomagnesemia 01/04/2014  . Liver disease   . MI (myocardial infarction) ([place])   . Opiate use 02/27/2016  . Osteoarthritis   . Stroke [facility])     PAST SURGICAL HISTORY: Past Surgical History:  Procedure Laterality Date  . CARDIAC CATHETERIZATION Left 02/22/2016   Procedure: Left Heart Cath and Coronary Angiography;  Surgeon: Yolonda Kida, MD;  Location: [facility];  Service: Cardiovascular;  Laterality: Left;  . COLONOSCOPY WITH PROPOFOL N/A 09/19/2016   Procedure: COLONOSCOPY WITH PROPOFOL;  Surgeon: [name], MD;  Location: [facility] END[facility];  Service: Endoscopy;  Laterality: N/A;  . ESOPHAGOGASTRODUODENOSCOPY (EGD) WITH PROPOFOL N/A 09/18/2017   Procedure: ESOPHAGOGASTRODUODENOSCOPY (EGD) WITH PROPOFOL;  Surgeon: [name], MD;  Location: [facility];  Service: Gastroenterology;  Laterality: N/A;  . hemorroids    . NASAL SINUS SURGERY    . ORIF FEMUR FRACTURE    . ORIF TIBIA & FIBULA FRACTURES    . OVARY SURGERY      FAMILY HISTORY: Family History  Problem Relation Age of Onset  . Heart disease Mother   . Lung cancer Mother   . Ovarian cancer Mother   . Healthy Brother   . Healthy Brother   . Healthy Brother   . Diabetes Maternal Uncle   . Breast cancer Maternal Aunt     SOCIAL HISTORY:  Social History   Socioeconomic History  . Marital status: Widowed    Spouse name: Not on file  . Number of children: 0  . Years of education: Not on file  . Highest education level: 9th grade  Occupational History  . Occupation: disabled  Social Needs  . Financial resource strain: Hard  . Food insecurity:    Worry: Often true    Inability: Often true  . Transportation needs:    Medical: Yes    Non-medical: Yes  Tobacco Use  . Smoking status: Current Every Day Smoker    Packs/day: 0.50    Types: Cigarettes  . Smokeless tobacco: Never Used  Substance and Sexual Activity  . Alcohol use: No    Alcohol/week: 0.0 standard drinks  . Drug use: Yes    Types: Marijuana    Comment: 2 months ago   . Sexual activity: Yes    Birth control/protection: None  Lifestyle  . Physical activity:    Days per week: 0 days    Minutes per session: 0 min  . Stress: Not on file  Relationships  . Social connections:    Talks on phone: Twice a week    Gets together: Never    Attends religious service:  Never    Active member of club or organization: No    Attends meetings of clubs or organizations: Never    Relationship status: Widowed  . Intimate partner violence:    Fear of current or ex partner: No    Emotionally abused: No    Physically abused: No    Forced sexual activity: No  Other Topics Concern  . Not on file  Social History Narrative   Moved from [place].     PHYSICAL EXAM   Vitals:   02/12/18 3235  BP: 139/85  Pulse: 66  Weight: 182 lb 8 oz (82.8 kg)  Height: 5\' 4"  (1.626 m)    Not recorded      Body mass index is 31.33 kg/m.  PHYSICAL EXAMNIATION:  Gen: NAD, conversant, well nourised, obese, well groomed                     Cardiovascular: Regular rate rhythm, no peripheral edema, warm, nontender. Eyes: Conjunctivae clear without exudates or hemorrhage Neck: Supple, no carotid bruits. Pulmonary: Clear to auscultation bilaterally   NEUROLOGICAL EXAM: Mild distress middle-aged female  MENTAL STATUS: Speech:    Speech is normal; fluent and spontaneous with normal comprehension.  Cognition:     Orientation to time, place and person     Normal recent and remote memory     Normal Attention span and concentration     Normal Language, naming, repeating,spontaneous speech     Fund of knowledge   CRANIAL NERVES: CN II: Visual fields are full to confrontation. Pupils are round equal and briskly reactive to light. CN III, IV, VI: extraocular movement are normal. No ptosis. CN V: Facial sensation is intact to pinprick in all 3 divisions bilaterally. Corneal responses are intact.  CN VII: Face is symmetric with normal eye closure and smile. CN VIII: Hearing is normal to rubbing fingers CN IX, X: Palate elevates symmetrically. Phonation is normal. CN XI: Head turning and shoulder shrug are intact CN XII: Tongue is midline with normal movements and no atrophy.  MOTOR: There is no pronator drift of out-stretched arms. Muscle bulk and tone are normal.  Muscle strength is normal.  REFLEXES: Reflexes are 2+ and symmetric at the biceps, triceps, knees, and ankles. Plantar responses are flexor.  SENSORY: Mildly length dependent decreased to light touch, pinprick, vibratory sensations.  COORDINATION: Rapid alternating movements and fine finger movements are intact. There is no dysmetria on finger-to-nose and heel-knee-shin.    GAIT/STANCE: Posture is normal. Gait is steady with normal steps, base, arm swing, and turning. Heel and toe walking are normal. Tandem gait is normal.  Romberg is absent.   DIAGNOSTIC DATA (LABS, IMAGING, TESTING) - I reviewed patient records, labs, notes, testing and imaging myself where available.   ASSESSMENT AND PLAN  Doratha R Glasser is a 52 y.o. female   Cervical spondylitic disease, Chronic neck pain, Persistent daily headaches,  MRI of the cervical spine showed moderate C6-7 canal stenosis, canal diameter of 6 mm, cord flattening,  She has tried and failed NSAIDs, gabapentin, physical therapy, epidural injection, chronic narcotic treatment pain management,  Will refer her to neurosurgeon for evaluation,   [name], M.D. Ph.D.  [facility] [address] [place], [place] [address] Ph: [phone] Fax: [phone]  CC: [name], [name]

## 2018-02-19 ENCOUNTER — Other Ambulatory Visit: Payer: Self-pay | Admitting: Gastroenterology

## 2018-02-19 ENCOUNTER — Telehealth: Payer: Self-pay | Admitting: Neurology

## 2018-02-19 DIAGNOSIS — R109 Unspecified abdominal pain: Secondary | ICD-10-CM

## 2018-02-19 NOTE — Telephone Encounter (Signed)
Called and spoke to patient and relayed she has an apt with [facility] 03/09/2018 arrive at 8:30 am for 9:00 am apt . Patient also needs to get her Pain Records from the [facility] Patient understood details and she will get them .

## 2018-02-20 ENCOUNTER — Emergency Department: Payer: Medicaid Other

## 2018-02-20 ENCOUNTER — Other Ambulatory Visit: Payer: Self-pay

## 2018-02-20 ENCOUNTER — Emergency Department
Admission: EM | Admit: 2018-02-20 | Discharge: 2018-02-20 | Disposition: A | Discharge status: Home / Self Care | Payer: Medicaid Other | Attending: Emergency Medicine | Admitting: Emergency Medicine

## 2018-02-20 ENCOUNTER — Ambulatory Visit (INDEPENDENT_AMBULATORY_CARE_PROVIDER_SITE_OTHER): Payer: Medicaid Other | Admitting: Physician Assistant

## 2018-02-20 ENCOUNTER — Encounter: Payer: Self-pay | Admitting: Emergency Medicine

## 2018-02-20 ENCOUNTER — Encounter: Payer: Self-pay | Admitting: Physician Assistant

## 2018-02-20 VITALS — BP 96/60 | HR 68 | Temp 98.2°F | Resp 20 | Wt 180.0 lb

## 2018-02-20 DIAGNOSIS — R109 Unspecified abdominal pain: Secondary | ICD-10-CM

## 2018-02-20 DIAGNOSIS — R1012 Left upper quadrant pain: Secondary | ICD-10-CM | POA: Diagnosis not present

## 2018-02-20 DIAGNOSIS — I10 Essential (primary) hypertension: Secondary | ICD-10-CM | POA: Diagnosis not present

## 2018-02-20 DIAGNOSIS — J45909 Unspecified asthma, uncomplicated: Secondary | ICD-10-CM | POA: Diagnosis not present

## 2018-02-20 DIAGNOSIS — Z79899 Other long term (current) drug therapy: Secondary | ICD-10-CM | POA: Diagnosis not present

## 2018-02-20 DIAGNOSIS — R1011 Right upper quadrant pain: Secondary | ICD-10-CM | POA: Diagnosis not present

## 2018-02-20 DIAGNOSIS — F17210 Nicotine dependence, cigarettes, uncomplicated: Secondary | ICD-10-CM | POA: Insufficient documentation

## 2018-02-20 DIAGNOSIS — J432 Centrilobular emphysema: Secondary | ICD-10-CM | POA: Diagnosis not present

## 2018-02-20 DIAGNOSIS — Z7982 Long term (current) use of aspirin: Secondary | ICD-10-CM | POA: Insufficient documentation

## 2018-02-20 DIAGNOSIS — F17200 Nicotine dependence, unspecified, uncomplicated: Secondary | ICD-10-CM

## 2018-02-20 DIAGNOSIS — R1010 Upper abdominal pain, unspecified: Secondary | ICD-10-CM | POA: Insufficient documentation

## 2018-02-20 DIAGNOSIS — I2510 Atherosclerotic heart disease of native coronary artery without angina pectoris: Secondary | ICD-10-CM | POA: Diagnosis not present

## 2018-02-20 DIAGNOSIS — F32A Depression, unspecified: Secondary | ICD-10-CM

## 2018-02-20 DIAGNOSIS — E785 Hyperlipidemia, unspecified: Secondary | ICD-10-CM | POA: Diagnosis not present

## 2018-02-20 DIAGNOSIS — R739 Hyperglycemia, unspecified: Secondary | ICD-10-CM | POA: Diagnosis not present

## 2018-02-20 DIAGNOSIS — R1013 Epigastric pain: Secondary | ICD-10-CM | POA: Diagnosis not present

## 2018-02-20 DIAGNOSIS — F329 Major depressive disorder, single episode, unspecified: Secondary | ICD-10-CM

## 2018-02-20 LAB — CBC
HCT: 40 % (ref 35.0–47.0)
Hemoglobin: 13.3 g/dL (ref 12.0–16.0)
MCH: 28.9 pg (ref 26.0–34.0)
MCHC: 33.2 g/dL (ref 32.0–36.0)
MCV: 87 fL (ref 80.0–100.0)
Platelets: 157 10*3/uL (ref 150–440)
RBC: 4.6 MIL/uL (ref 3.80–5.20)
RDW: 17.2 % — ABNORMAL HIGH (ref 11.5–14.5)
WBC: 11.5 10*3/uL — ABNORMAL HIGH (ref 3.6–11.0)

## 2018-02-20 LAB — COMPREHENSIVE METABOLIC PANEL
ALT: 15 U/L (ref 0–44)
AST: 23 U/L (ref 15–41)
Albumin: 4 g/dL (ref 3.5–5.0)
Alkaline Phosphatase: 66 U/L (ref 38–126)
Anion gap: 8 (ref 5–15)
BUN: 10 mg/dL (ref 6–20)
CO2: 22 mmol/L (ref 22–32)
Calcium: 8.9 mg/dL (ref 8.9–10.3)
Chloride: 105 mmol/L (ref 98–111)
Creatinine, Ser: 0.85 mg/dL (ref 0.44–1.00)
GFR calc Af Amer: >60 mL/min (ref >60)
GFR calc non Af Amer: >60 mL/min (ref >60)
Glucose, Bld: 109 mg/dL — ABNORMAL HIGH (ref 70–99)
Potassium: 3.6 mmol/L (ref 3.5–5.1)
Sodium: 135 mmol/L (ref 135–145)
Total Bilirubin: 0.6 mg/dL (ref 0.3–1.2)
Total Protein: 7.5 g/dL (ref 6.5–8.1)

## 2018-02-20 LAB — URINALYSIS, COMPLETE (UACMP) WITH MICROSCOPIC
Bilirubin Urine: NEGATIVE
Glucose, UA: NEGATIVE mg/dL
Hgb urine dipstick: NEGATIVE
Ketones, ur: NEGATIVE mg/dL
Nitrite: NEGATIVE
Protein, ur: NEGATIVE mg/dL
Specific Gravity, Urine: 1.003 — ABNORMAL LOW (ref 1.005–1.030)
pH: 6 (ref 5.0–8.0)

## 2018-02-20 LAB — LIPASE, BLOOD: Lipase: 41 U/L (ref 11–51)

## 2018-02-20 MED ORDER — OXYCODONE HCL 5 MG PO TABS
10.0000 mg | ORAL_TABLET | Freq: Once | ORAL | Status: AC
  Administered 2018-02-20: 10 mg via ORAL
  Filled 2018-02-20: qty 2

## 2018-02-20 MED ORDER — NICOTINE 21 MG/24HR TD PT24: 21.0000 mg | MEDICATED_PATCH | Freq: Every day | TRANSDERMAL | 0 refills | Status: AC

## 2018-02-20 MED ORDER — MORPHINE SULFATE (PF) 4 MG/ML IV SOLN
4.0000 mg | Freq: Once | INTRAVENOUS | Status: AC
  Administered 2018-02-20: 4 mg via INTRAMUSCULAR
  Filled 2018-02-20: qty 1

## 2018-02-20 MED ORDER — NICOTINE 7 MG/24HR TD PT24: 7.0000 mg | MEDICATED_PATCH | Freq: Every day | TRANSDERMAL | 0 refills | Status: AC

## 2018-02-20 MED ORDER — NICOTINE 14 MG/24HR TD PT24: 14.0000 mg | MEDICATED_PATCH | Freq: Every day | TRANSDERMAL | 0 refills | Status: AC

## 2018-02-20 MED ORDER — PANTOPRAZOLE SODIUM 40 MG PO TBEC: 40.0000 mg | DELAYED_RELEASE_TABLET | Freq: Every day | ORAL | 1 refills | Status: DC

## 2018-02-20 MED ORDER — ALUM & MAG HYDROXIDE-SIMETH 200-200-20 MG/5ML PO SUSP: 30.0000 mL | ORAL | 0 refills | Status: DC | PRN

## 2018-02-20 MED ORDER — GI COCKTAIL ~~LOC~~
30.0000 mL | Freq: Once | ORAL | Status: AC
  Administered 2018-02-20: 30 mL via ORAL
  Filled 2018-02-20: qty 30

## 2018-02-20 NOTE — Progress Notes (Signed)
Patient: Sara Munoz Female    DOB: February 19, 1966   52 y.o.   MRN: [id number] Visit Date: 02/20/2018  Today's Provider: [name], [name]   Chief Complaint  Patient presents with  . Follow-up    chronic active hepatitis  . Nicotine Dependence   Subjective:    HPI  Follow up for chronic active hepatitis  The patient was last seen for this 6 months ago. Changes made at last visit include no changes.  She reports excellent compliance with treatment. She feels that condition is Unchanged. Patient reports persistent abdominal pain, with pain medication. She is having side effects.   ------------------------------------------------------------------------------------  Tobacco Cessation: Patient would like to talk about medication to help stop smoking. Patient reports she smokes about a pack a day. She would like to quit.   HTN: Lisinopril 40 mg QD, metoprolol tartrate 25 mg BID. She was recently seen in cardiology and had angina at rest. She sees Dr. Clayborn Bigness at [facility]. She was supposed to be scheduled for an echocardiogram and Myoview but patient has no-showed four times and [facility] is now refusing to schedule her.   COPD: taking Spiriva daily and symbicort. Albuterol for breakthrough.   HLD: Lipitor 20 mg daily. Due for labwork today.   Abdominal Pain: She is also reporting abdominal pain in the epigastric region. EGD this march was normal. Colonoscopy with three polyps earlier this March. She reports nausea at baseline. Has completed treatment for Hepatitis C. Reports history of pancreatitis. 02/2017 CT does not show gallstones, pain is not related to food.   Influenza vaccination declined.     Allergies  Allergen Reactions  . Levofloxacin Hives and Swelling  . Flexeril [Cyclobenzaprine] Hives, Swelling and Other (See Comments)    Reaction:  Facial/lip swelling  Reaction:  Facial/lip swelling   . Keflex [Cephalexin] Hives  . Ketorolac   .  Phenergan [Promethazine Hcl] Other (See Comments)    Agitation.   . Toradol [Ketorolac Tromethamine] Swelling and Other (See Comments)    Reaction:  Facial/tongue swelling  Reaction:  Facial/tongue swelling   . Tramadol Hives, Swelling and Other (See Comments)    Reaction:  Lip swelling  Reaction:  Lip swelling   . Zoloft [Sertraline Hcl] Swelling and Other (See Comments)    Reaction:  Tongue swelling  Reaction:  Tongue swelling      Current Outpatient Medications:  .  albuterol (PROVENTIL HFA;VENTOLIN HFA) 108 (90 Base) MCG/ACT inhaler, Inhale 2 puffs into the lungs every 6 (six) hours as needed for wheezing or shortness of breath., Disp: 1 Inhaler, Rfl: 2 .  aspirin EC 81 MG tablet, Take 81 mg by mouth daily., Disp: , Rfl:  .  budesonide-formoterol (SYMBICORT) 160-4.5 MCG/ACT inhaler, Inhale 2 puffs into the lungs 2 (two) times daily., Disp: 1 Inhaler, Rfl: 0 .  CARAFATE 1 GM/10ML suspension, TAKE 10 MLS (1 G TOTAL) BY MOUTH 4 (FOUR) TIMES DAILY., Disp: 420 mL, Rfl: 1 .  docusate sodium (COLACE) 100 MG capsule, Take 200 mg by mouth 2 (two) times daily as needed for mild constipation., Disp: , Rfl:  .  DULoxetine (CYMBALTA) 60 MG capsule, Take 1 capsule (60 mg total) by mouth daily., Disp: 30 capsule, Rfl: 12 .  gabapentin (NEURONTIN) 600 MG tablet, Take 1 tablet (600 mg total) by mouth 3 (three) times daily., Disp: 90 tablet, Rfl: 2 .  lisinopril (PRINIVIL,ZESTRIL) 40 MG tablet, Take 1 tablet (40 mg total) by mouth daily., Disp:  90 tablet, Rfl: 1 .  metoprolol tartrate (LOPRESSOR) 25 MG tablet, TAKE 1 TABLET BY MOUTH TWICE A DAY, Disp: 180 tablet, Rfl: 1 .  nitroGLYCERIN (NITROSTAT) 0.4 MG SL tablet, Place 1 tablet (0.4 mg total) under the tongue every 5 (five) minutes x 3 doses as needed for chest pain., Disp: 30 tablet, Rfl: 2 .  omeprazole (PRILOSEC) 40 MG capsule, Take 1 capsule (40 mg total) daily by mouth., Disp: 90 capsule, Rfl: 3 .  ondansetron (ZOFRAN-ODT) 8 MG disintegrating  tablet, Take 1 tablet (8 mg total) by mouth 2 (two) times daily., Disp: 20 tablet, Rfl: 6 .  oxyCODONE (OXY IR/ROXICODONE) 5 MG immediate release tablet, Take 5 mg by mouth every 4 (four) hours as needed for severe pain., Disp: , Rfl:  .  rizatriptan (MAXALT-MLT) 10 MG disintegrating tablet, Take 1 tablet (10 mg total) by mouth as needed. May repeat in 2 hours if needed, Disp: 15 tablet, Rfl: 6 .  Tiotropium Bromide Monohydrate (SPIRIVA RESPIMAT) 2.5 MCG/ACT AERS, Inhale 1 puff into the lungs daily., Disp: 4 g, Rfl: 2 .  tiZANidine (ZANAFLEX) 2 MG tablet, Take by mouth every 6 (six) hours as needed for muscle spasms., Disp: , Rfl:  .  atorvastatin (LIPITOR) 20 MG tablet, Take 20 mg by mouth at bedtime., Disp: , Rfl:  .  Naloxone HCl (NARCAN IJ), Inject as directed as needed., Disp: , Rfl:  .  nicotine (NICODERM CQ - DOSED IN MG/24 HOURS) 14 mg/24hr patch, Place 1 patch (14 mg total) onto the skin daily for 14 days., Disp: 14 patch, Rfl: 0 .  nicotine (NICODERM CQ - DOSED IN MG/24 HOURS) 21 mg/24hr patch, Place 1 patch (21 mg total) onto the skin daily., Disp: 42 patch, Rfl: 0 .  nicotine (NICODERM CQ) 7 mg/24hr patch, Place 1 patch (7 mg total) onto the skin daily for 14 days., Disp: 14 patch, Rfl: 0  Review of Systems  Constitutional: Positive for fatigue.  Respiratory: Negative.   Cardiovascular: Negative.   Gastrointestinal: Positive for abdominal distention, abdominal pain and nausea.    Social History   Tobacco Use  . Smoking status: Current Every Day Smoker    Packs/day: 0.50    Types: Cigarettes  . Smokeless tobacco: Never Used  Substance Use Topics  . Alcohol use: No    Alcohol/week: 0.0 standard drinks   Objective:   BP 96/60 (BP Location: Left Arm, Patient Position: Sitting, Cuff Size: Normal)   Pulse 68   Temp 98.2 F (36.8 C) (Oral)   Resp 20   Wt 180 lb (81.6 kg)   SpO2 99%   BMI 30.90 kg/m  Vitals:   02/20/18 1144  BP: 96/60  Pulse: 68  Resp: 20  Temp: 98.2  F (36.8 C)  TempSrc: Oral  SpO2: 99%  Weight: 180 lb (81.6 kg)     Physical Exam  Constitutional: She is oriented to person, place, and time. She appears well-developed and well-nourished.  Cardiovascular: Normal rate and regular rhythm.  Pulmonary/Chest: Effort normal and breath sounds normal.  Abdominal: Soft. Bowel sounds are normal. There is tenderness in the epigastric area.  Mild epigastric tenderness.   Neurological: She is alert and oriented to person, place, and time.  Skin: Skin is warm and dry.  Psychiatric: She has a normal mood and affect. Her behavior is normal.        Assessment & Plan:     1. Hyperlipidemia, unspecified hyperlipidemia type  Labs as below, currently on Lipitor 20  mg daily.   - Comprehensive Metabolic Panel (CMET) - Lipid Profile - Ambulatory referral to Chronic Care Management Services  2. Hyperglycemia  - HgB A1c - Ambulatory referral to Chronic Care Management Services  3. Smoker  Take one 21 mg patchy daily x 6 weeks. Then one 14 mg patch daily x 2 weeks. Then one 7 mg patch daily x 2 weeks.  - nicotine (NICODERM CQ - DOSED IN MG/24 HOURS) 21 mg/24hr patch; Place 1 patch (21 mg total) onto the skin daily.  Dispense: 42 patch; Refill: 0 - nicotine (NICODERM CQ - DOSED IN MG/24 HOURS) 14 mg/24hr patch; Place 1 patch (14 mg total) onto the skin daily for 14 days.  Dispense: 14 patch; Refill: 0 - nicotine (NICODERM CQ) 7 mg/24hr patch; Place 1 patch (7 mg total) onto the skin daily for 14 days.  Dispense: 14 patch; Refill: 0 - Ambulatory referral to Chronic Care Management Services  4. Centrilobular emphysema (HCC)  Continue inhalers.  - Ambulatory referral to Chronic Care Management Services  5. Hypertension, unspecified type  Low end today, patient is not symptomatic and pulse is not tachycardic.   - Ambulatory referral to Chronic Care Management Services  6. Abdominal pain, unspecified abdominal location  GI provider has  sent in sucralfate. Seeing GI 02/25/2018. Order labs as below.  - CBC with Differential - Lipase - Amylase  7. Coronary artery disease involving native coronary artery of native heart without angina pectoris  Has no showed imaging appointments four times and now [facility] cardiology is refusing to see her. Referring to nurse case management for better coordination of care. She will need cardiologist, especially in context of upcoming surgery on her neck, to clear her for operation   8. Depression  Has not been following up with psychiatry who prescribes her medications.   Return in about 3 months (around 05/23/2018) for chronic disease management .  The entirety of the information documented in the History of Present Illness, Review of Systems and Physical Exam were personally obtained by me. Portions of this information were initially documented by Lynford Humphrey, CMA and reviewed by me for thoroughness and accuracy.         [name], [name]  [facility]

## 2018-02-20 NOTE — Patient Instructions (Signed)

## 2018-02-20 NOTE — ED Triage Notes (Signed)
Pt presents to ED with epigastric abd pain for the past 3 days with no improvement. Pt states she was seen by her pcp earlier today and told that it could be her gallbladder. Pt denies vomiting. Told to come to ED if symptoms persist.

## 2018-02-20 NOTE — Progress Notes (Signed)
  Chronic Care Management   Referral Note  02/20/2018 Name: Sara Munoz MRN: [id number] DOB: 05/29/1966  Referred by: [name], [name] Reason for referral : Follow-up (chronic active hepatitis) and Nicotine Dependence  Referral received for CCM services for [name].   Plan: I will reach out to [name] by phone to offer CCM services.   Janalyn Shy [facility] [phone]

## 2018-02-20 NOTE — ED Notes (Signed)
Reviewed discharge instructions, follow-up care, and prescriptions with patient. Patient verbalized understanding of all information reviewed. Patient stable, with no distress noted at this time.    

## 2018-02-20 NOTE — ED Provider Notes (Signed)
[facility] Emergency Department Provider Note  Time seen: 11:20 PM  I have reviewed the triage vital signs and the nursing notes.   HISTORY  Chief Complaint Abdominal Pain    HPI Sara Munoz is a 52 y.o. female with a past medical history of anxiety, CAD, chronic back pain on pain management, COPD, gastric reflux, hypertension, hyperlipidemia, presents to the emergency department for upper abdominal pain.  According to the patient for the past 3 days she has been experiencing upper abdominal pain which she describes as aching moderate to severe in intensity across the entire upper abdomen.  Patient saw her doctor today who thought it could be her gallbladder and sent her to the emergency department.  Patient denies any worsening of pain after eating.  Denies any fever.  Denies any vomiting or diarrhea.  No dysuria or hematuria.     Past Medical History:  Diagnosis Date  . Anxiety   . Asthma   . Atypical chest pain 08/02/2015  . CAD (coronary artery disease)   . Chronic back pain   . COPD (chronic obstructive pulmonary disease) ([place])   . Coronary artery disease    a. Mild-non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/2014  . GERD (gastroesophageal reflux disease)   . Hypercholesteremia   . Hypertension   . Hypokalemia   . Hypomagnesemia 01/04/2014  . Liver disease   . MI (myocardial infarction) ([place])   . Opiate use 02/27/2016  . Osteoarthritis   . Stroke [facility])     Patient Active Problem List   Diagnosis Date Noted  . Cervical radiculopathy 02/12/2018  . Chronic migraine 02/12/2018  . Paresthesia 08/29/2017  . Neck pain 08/29/2017  . Daily headache 08/29/2017  . Chronic active hepatitis ([place]) 07/29/2017  . Neurogenic pain 07/31/2016  . Chronic pain syndrome 06/05/2016  . Carotid artery stenosis 02/27/2016  . Chronic constipation 02/27/2016  . Chronic nausea 02/27/2016  . Hypercholesterolemia 02/27/2016  . Osteoarthritis 02/27/2016  . PTSD (post-traumatic  stress disorder) 02/27/2016  . TIA (transient ischemic attack) 02/27/2016  . Long term current use of opiate analgesic 02/27/2016  . Long term prescription opiate use 02/27/2016  . Encounter for pain management consult 02/27/2016  . Chronic hip pain (Location of Tertiary source of pain) (Bilateral) (L>R) 02/27/2016  . Chronic knee pain (Bilateral) (L>R) 02/27/2016  . Chronic shoulder pain (Bilateral) (L>R) 02/27/2016  . Chronic sacroiliac joint pain (Bilateral) (L>R) 02/27/2016  . Chronic low back pain (Location of Primary Source of Pain) (Bilateral) (L>R) 02/27/2016  . Chronic lower extremity pain (Location of Secondary source of pain) (Bilateral) (L>R) 02/27/2016  . Osteoarthritis of hip (Bilateral) (L>R) 02/27/2016  . Chronic neck pain (posterior midline) (Bilateral) (L>R) 02/27/2016  . Cervicogenic headache (Bilateral) (L>R) 02/27/2016  . Occipital headache (Bilateral) (L>R) 02/27/2016  . Chronic upper extremity pain (Bilateral) (L>R) 02/27/2016  . Chronic Cervical radicular pain (Bilateral) (L>R) 02/27/2016  . Chronic lumbar radicular pain (Right) (L5 dermatome) 02/27/2016  . Lumbar facet syndrome (Bilateral) (L>R) 02/27/2016  . Long term prescription benzodiazepine use 02/27/2016  . Hypertension   . COPD (chronic obstructive pulmonary disease) ([place]) 10/09/2015  . GERD (gastroesophageal reflux disease) 10/09/2015  . Non-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/2014 08/02/2015  . Hyperlipidemia 08/02/2015  . Costochondritis 08/02/2015  . 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 GAD (generalized anxiety disorder) 09/03/2014  . Narcotic dependence ([place]) 09/03/2014  . PVD (peripheral vascular disease) ([place]) 09/03/2014  . Smoker 09/03/2014  . Cigarette nicotine dependence with nicotine-induced disorder  07/08/2014  . Anemia of chronic disease 03/19/2014  . Narcotic abuse, continuous ([place]) 03/19/2014  . Polysubstance abuse ([place]) 03/19/2014  . MSSA (methicillin susceptible Staphylococcus aureus)  septicemia ([place]) 03/07/2014  . Hypomagnesemia 01/04/2014  . Chronic cough 09/04/2011  . Sleep apnea, obstructive 09/04/2011    Past Surgical History:  Procedure Laterality Date  . CARDIAC CATHETERIZATION Left 02/22/2016   Procedure: Left Heart Cath and Coronary Angiography;  Surgeon: Yolonda Kida, MD;  Location: [facility];  Service: Cardiovascular;  Laterality: Left;  . COLONOSCOPY WITH PROPOFOL N/A 09/19/2016   Procedure: COLONOSCOPY WITH PROPOFOL;  Surgeon: [name], MD;  Location: [facility] ENDOSCOPY;  Service: Endoscopy;  Laterality: N/A;  . ESOPHAGOGASTRODUODENOSCOPY (EGD) WITH PROPOFOL N/A 09/18/2017   Procedure: ESOPHAGOGASTRODUODENOSCOPY (EGD) WITH PROPOFOL;  Surgeon: [name], MD;  Location: [facility] ENDOSCOPY;  Service: Gastroenterology;  Laterality: N/A;  . hemorroids    . NASAL SINUS SURGERY    . ORIF FEMUR FRACTURE    . ORIF TIBIA & FIBULA FRACTURES    . OVARY SURGERY      Prior to Admission medications   Medication Sig Start Date End Date Taking? Authorizing Provider  albuterol (PROVENTIL HFA;VENTOLIN HFA) 108 (90 Base) MCG/ACT inhaler Inhale 2 puffs into the lungs every 6 (six) hours as needed for wheezing or shortness of breath. 05/26/17   [name], [name]  aspirin EC 81 MG tablet Take 81 mg by mouth daily.    [provider]  atorvastatin (LIPITOR) 20 MG tablet Take 20 mg by mouth at bedtime.    [provider]  budesonide-formoterol (SYMBICORT) 160-4.5 MCG/ACT inhaler Inhale 2 puffs into the lungs 2 (two) times daily. 05/26/17   Pollak, Wendee Beavers, [name]  CARAFATE 1 GM/10ML suspension TAKE 10 MLS (1 G TOTAL) BY MOUTH 4 (FOUR) TIMES DAILY. 12/16/17   [name], MD  docusate sodium (COLACE) 100 MG capsule Take 200 mg by mouth 2 (two) times daily as needed for mild constipation.    [provider]  DULoxetine (CYMBALTA) 60 MG capsule Take 1 capsule (60 mg total) by mouth daily. 02/12/18   [name], MD  gabapentin (NEURONTIN) 600 MG  tablet Take 1 tablet (600 mg total) by mouth 3 (three) times daily. 06/26/17 02/20/18  [name], [name]  lisinopril (PRINIVIL,ZESTRIL) 40 MG tablet Take 1 tablet (40 mg total) by mouth daily. 08/05/17   [name], [name]  metoprolol tartrate (LOPRESSOR) 25 MG tablet TAKE 1 TABLET BY MOUTH TWICE A DAY 01/21/18   Carles Collet M, [name]  Naloxone HCl (NARCAN IJ) Inject as directed as needed.    [provider]  nicotine (NICODERM CQ - DOSED IN MG/24 HOURS) 14 mg/24hr patch Place 1 patch (14 mg total) onto the skin daily for 14 days. 02/20/18 03/06/18  [name], [name]  nicotine (NICODERM CQ - DOSED IN MG/24 HOURS) 21 mg/24hr patch Place 1 patch (21 mg total) onto the skin daily. 02/20/18 04/03/18  [name], [name]  nicotine (NICODERM CQ) 7 mg/24hr patch Place 1 patch (7 mg total) onto the skin daily for 14 days. 02/20/18 03/06/18  [name], [name]  nitroGLYCERIN (NITROSTAT) 0.4 MG SL tablet Place 1 tablet (0.4 mg total) under the tongue every 5 (five) minutes x 3 doses as needed for chest pain. 08/03/15   Demetrios Loll, MD  omeprazole (PRILOSEC) 40 MG capsule Take 1 capsule (40 mg total) daily by mouth. 05/07/17   [name], MD  ondansetron (ZOFRAN-ODT) 8 MG  disintegrating tablet Take 1 tablet (8 mg total) by mouth 2 (two) times daily. 02/12/18   [name], MD  oxyCODONE (OXY IR/ROXICODONE) 5 MG immediate release tablet Take 5 mg by mouth every 4 (four) hours as needed for severe pain.    [provider]  rizatriptan (MAXALT-MLT) 10 MG disintegrating tablet Take 1 tablet (10 mg total) by mouth as needed. May repeat in 2 hours if needed 02/12/18   [name], MD  Tiotropium Bromide Monohydrate (SPIRIVA RESPIMAT) 2.5 MCG/ACT AERS Inhale 1 puff into the lungs daily. 05/26/17   [name], [name]  tiZANidine (ZANAFLEX) 2 MG tablet Take by mouth every 6 (six) hours as needed for muscle spasms.    [provider]    Allergies  Allergen Reactions  .  Levofloxacin Hives and Swelling  . Flexeril [Cyclobenzaprine] Hives, Swelling and Other (See Comments)    Reaction:  Facial/lip swelling  Reaction:  Facial/lip swelling   . Keflex [Cephalexin] Hives  . Ketorolac   . Phenergan [Promethazine Hcl] Other (See Comments)    Agitation.   . Toradol [Ketorolac Tromethamine] Swelling and Other (See Comments)    Reaction:  Facial/tongue swelling  Reaction:  Facial/tongue swelling   . Tramadol Hives, Swelling and Other (See Comments)    Reaction:  Lip swelling  Reaction:  Lip swelling   . Zoloft [Sertraline Hcl] Swelling and Other (See Comments)    Reaction:  Tongue swelling  Reaction:  Tongue swelling     Family History  Problem Relation Age of Onset  . Heart disease Mother   . Lung cancer Mother   . Ovarian cancer Mother   . Healthy Brother   . Healthy Brother   . Healthy Brother   . Diabetes Maternal Uncle   . Breast cancer Maternal Aunt     Social History Social History   Tobacco Use  . Smoking status: Current Every Day Smoker    Packs/day: 0.50    Types: Cigarettes  . Smokeless tobacco: Never Used  Substance Use Topics  . Alcohol use: No    Alcohol/week: 0.0 standard drinks  . Drug use: Yes    Types: Marijuana    Comment: 2 months ago     Review of Systems Constitutional: Negative for fever Cardiovascular: Negative for chest pain. Respiratory: Negative for shortness of breath. Gastrointestinal: Moderate upper abdominal pain, aching.  Negative for vomiting or diarrhea Genitourinary: Negative for urinary compaints Musculoskeletal: Negative for musculoskeletal complaints Skin: Negative for skin complaints  Neurological: Negative for headache All other ROS negative  ____________________________________________   PHYSICAL EXAM:  VITAL SIGNS: ED Triage Vitals [02/20/18 1932]  Enc Vitals Group     BP 115/79     Pulse Rate 86     Resp 20     Temp 98.1 F (36.7 C)     Temp Source Oral     SpO2 98 %     Weight  180 lb (81.6 kg)     Height 5\' 4"  (1.626 m)     Head Circumference      Peak Flow      Pain Score 10     Pain Loc      Pain Edu?      Excl. in [place]?    Constitutional: Alert and oriented. Well appearing and in no distress. Eyes: Normal exam ENT   Head: Normocephalic and atraumatic.   Mouth/Throat: Mucous membranes are moist. Cardiovascular: Normal rate, regular rhythm. No murmur Respiratory: Normal respiratory effort without tachypnea  nor retractions. Breath sounds are clear  Gastrointestinal: Soft, moderate upper abdominal tenderness palpation across the right upper quadrant left upper quadrant epigastrium.  No rebound or guarding or distention. Musculoskeletal: Nontender with normal range of motion in all extremities.  Neurologic:  Normal speech and language. No gross focal neurologic deficits  Skin:  Skin is warm, dry and intact.  Psychiatric: Mood and affect are normal.   ____________________________________________    EKG  EKG reviewed and interpreted by myself shows normal sinus rhythm 87 bpm with a narrow QRS, normal axis, normal intervals, no concerning ST changes.  ____________________________________________    RADIOLOGY  Ultrasound is negative.  ____________________________________________   INITIAL IMPRESSION / ASSESSMENT AND PLAN / ED COURSE  Pertinent labs & imaging results that were available during my care of the patient were reviewed by me and considered in my medical decision making (see chart for details).  Patient presents to the emergency department for upper abdominal pain over the past 3 days.  Sent by PCP for possible gallbladder pain.  Patient has mild to moderate upper abdominal tenderness, no lower abdominal tenderness, no rebound guarding or distention.  Patient's labs are largely within normal limits slight slight leukocytosis of 11,500.  Lipase is normal LFTs are normal.  Ultrasound has resulted as normal as well.  Differential at this  time would include gastric ulcers, peptic ulcers, gastritis.  I discussed with the patient a trial of Protonix use of over-the-counter Maalox and follow-up with GI medicine.  Patient actually has a follow-up with Dr. Vicente Males next week.  I believe this is appropriate.  Patient agreeable to plan to discharge home she takes chronic pain medication, so we will not be prescribing a home pain medication for the patient.  ____________________________________________   FINAL CLINICAL IMPRESSION(S) / ED DIAGNOSES  Upper abdominal pain    Harvest Dark, MD 02/20/18 2324

## 2018-02-21 LAB — COMPREHENSIVE METABOLIC PANEL
ALT: 15 IU/L (ref 0–32)
AST: 23 IU/L (ref 0–40)
Albumin/Globulin Ratio: 1.7 (ref 1.2–2.2)
Albumin: 4.3 g/dL (ref 3.5–5.5)
Alkaline Phosphatase: 79 IU/L (ref 39–117)
BUN/Creatinine Ratio: 10 (ref 9–23)
BUN: 8 mg/dL (ref 6–24)
Bilirubin Total: 0.4 mg/dL (ref 0.0–1.2)
CO2: 21 mmol/L (ref 20–29)
Calcium: 9.4 mg/dL (ref 8.7–10.2)
Chloride: 101 mmol/L (ref 96–106)
Creatinine, Ser: 0.81 mg/dL (ref 0.57–1.00)
GFR calc Af Amer: 98 mL/min/{1.73_m2} (ref >59)
GFR calc non Af Amer: 85 mL/min/{1.73_m2} (ref >59)
Globulin, Total: 2.5 g/dL (ref 1.5–4.5)
Glucose: 73 mg/dL (ref 65–99)
Potassium: 4.1 mmol/L (ref 3.5–5.2)
Sodium: 141 mmol/L (ref 134–144)
Total Protein: 6.8 g/dL (ref 6.0–8.5)

## 2018-02-21 LAB — CBC WITH DIFFERENTIAL/PLATELET
Basophils Absolute: 0.1 10*3/uL (ref 0.0–0.2)
Basos: 1 %
EOS (ABSOLUTE): 0.1 10*3/uL (ref 0.0–0.4)
Eos: 1 %
Hematocrit: 42.2 % (ref 34.0–46.6)
Hemoglobin: 13.8 g/dL (ref 11.1–15.9)
Immature Grans (Abs): 0 10*3/uL (ref 0.0–0.1)
Immature Granulocytes: 0 %
Lymphocytes Absolute: 1.8 10*3/uL (ref 0.7–3.1)
Lymphs: 18 %
MCH: 28.8 pg (ref 26.6–33.0)
MCHC: 32.7 g/dL (ref 31.5–35.7)
MCV: 88 fL (ref 79–97)
Monocytes Absolute: 1 10*3/uL — ABNORMAL HIGH (ref 0.1–0.9)
Monocytes: 10 %
Neutrophils Absolute: 7.2 10*3/uL — ABNORMAL HIGH (ref 1.4–7.0)
Neutrophils: 70 %
Platelets: 181 10*3/uL (ref 150–450)
RBC: 4.79 x10E6/uL (ref 3.77–5.28)
RDW: 16 % — ABNORMAL HIGH (ref 12.3–15.4)
WBC: 10.3 10*3/uL (ref 3.4–10.8)

## 2018-02-21 LAB — LIPID PANEL
Chol/HDL Ratio: 5.5 ratio — ABNORMAL HIGH (ref 0.0–4.4)
Cholesterol, Total: 251 mg/dL — ABNORMAL HIGH (ref 100–199)
HDL: 46 mg/dL (ref >39)
LDL Calculated: 161 mg/dL — ABNORMAL HIGH (ref 0–99)
Triglycerides: 220 mg/dL — ABNORMAL HIGH (ref 0–149)
VLDL Cholesterol Cal: 44 mg/dL — ABNORMAL HIGH (ref 5–40)

## 2018-02-21 LAB — HEMOGLOBIN A1C
Est. average glucose Bld gHb Est-mCnc: 100 mg/dL
Hgb A1c MFr Bld: 5.1 % (ref 4.8–5.6)

## 2018-02-21 LAB — AMYLASE: Amylase: 38 U/L (ref 31–124)

## 2018-02-21 LAB — LIPASE: Lipase: 45 U/L (ref 14–72)

## 2018-02-25 ENCOUNTER — Encounter: Payer: Self-pay | Admitting: Gastroenterology

## 2018-02-25 ENCOUNTER — Ambulatory Visit: Payer: Medicaid Other | Admitting: Gastroenterology

## 2018-02-25 DIAGNOSIS — B1920 Unspecified viral hepatitis C without hepatic coma: Secondary | ICD-10-CM | POA: Diagnosis not present

## 2018-02-25 DIAGNOSIS — K5900 Constipation, unspecified: Secondary | ICD-10-CM

## 2018-02-25 DIAGNOSIS — R109 Unspecified abdominal pain: Secondary | ICD-10-CM

## 2018-02-25 MED ORDER — SUCRALFATE 1 GM/10ML PO SUSP: 1.0000 g | Freq: Four times a day (QID) | ORAL | 1 refills | Status: DC

## 2018-02-25 NOTE — Progress Notes (Signed)
[name] MD, MRCP(U.K) [address]  [place]  [place],  [address]  Main: [phone]  Fax: [phone]   Primary Care Physician: [name], [name]  Primary Gastroenterologist:  Dr. [name]   No chief complaint on file.   HPI: Sara Munoz is a 52 y.o. female   Summary of history : Yalitza R Dunlapwas initially referred back in 05/2016 for hepatitis CGT 3.Treatment naive. Completed treatment in 08/2017 .Autoimmune screen,HIV, Hep B serology -negative in 08/2016   CT abdomen 03/17/17- increased stool burden and hepatic steatosis. Colonoscopy 08/2016 - small adenomas excised  RUQ USG 05/07/16 showed hepatic steatosis.   Labs 12/2016- Albumin 4.1,Hb 14.1 and platelet count 213  Interval history 09/17/17 -02/25/18   At her last visit she had abdominal pain , 09/18/17 : EGD: normal appearance but bx showed mild active gastritis and negative for H pylori .   LFT's normal in 01/2018  She presented to the emergency room on 02/20/2018 with abdominal pain for 3 days duration.  She underwent an ultrasound of the abdomen which was negative.  She underwent lab work including liver function tests and lipase which was normal.  She was discharged to follow-up with me as an outpatient.   Abdominal pain began last week, not better since then , on and off, occurs atleast 3 times a day , each episode lasts >1 hour, wakes her up at night, epigastric, localized in the center. Nothing makes it better, nothing makes it worse. When she eats makes no difference. She has a bowel movement twice a day and is soft, somewhat better after a bowel movement . Does not feel at times she has had a good bowel movement . Occasional rectal bleeding which she has had in the past .   Lots of bloating and , abdominal distension. Still smoking , started nicotine patches  Does not her abdomen is significantly distended at times. Uses sugar in her coffee, no diet sodas, no crystallite. No  hard candy.   Was given protonix in the ER. Takes it eveyrday , before meals. Helps a bit not much.   Current Outpatient Medications  Medication Sig Dispense Refill  . albuterol (PROVENTIL HFA;VENTOLIN HFA) 108 (90 Base) MCG/ACT inhaler Inhale 2 puffs into the lungs every 6 (six) hours as needed for wheezing or shortness of breath. 1 Inhaler 2  . alum & mag hydroxide-simeth (MAALOX/MYLANTA) 200-200-20 MG/5ML suspension Take 30 mLs by mouth every 4 (four) hours as needed for indigestion or heartburn. 355 mL 0  . aspirin EC 81 MG tablet Take 81 mg by mouth daily.    Marland Kitchen atorvastatin (LIPITOR) 20 MG tablet Take 20 mg by mouth at bedtime.    . budesonide-formoterol (SYMBICORT) 160-4.5 MCG/ACT inhaler Inhale 2 puffs into the lungs 2 (two) times daily. 1 Inhaler 0  . CARAFATE 1 GM/10ML suspension TAKE 10 MLS (1 G TOTAL) BY MOUTH 4 (FOUR) TIMES DAILY. 420 mL 1  . docusate sodium (COLACE) 100 MG capsule Take 200 mg by mouth 2 (two) times daily as needed for mild constipation.    . DULoxetine (CYMBALTA) 60 MG capsule Take 1 capsule (60 mg total) by mouth daily. 30 capsule 12  . gabapentin (NEURONTIN) 600 MG tablet Take 1 tablet (600 mg total) by mouth 3 (three) times daily. 90 tablet 2  . lisinopril (PRINIVIL,ZESTRIL) 40 MG tablet Take 1 tablet (40 mg total) by mouth daily. 90 tablet 1  . metoprolol tartrate (LOPRESSOR) 25 MG tablet TAKE 1  TABLET BY MOUTH TWICE A DAY 180 tablet 1  . Naloxone HCl (NARCAN IJ) Inject as directed as needed.    . nicotine (NICODERM CQ - DOSED IN MG/24 HOURS) 14 mg/24hr patch Place 1 patch (14 mg total) onto the skin daily for 14 days. 14 patch 0  . nicotine (NICODERM CQ - DOSED IN MG/24 HOURS) 21 mg/24hr patch Place 1 patch (21 mg total) onto the skin daily. 42 patch 0  . nicotine (NICODERM CQ) 7 mg/24hr patch Place 1 patch (7 mg total) onto the skin daily for 14 days. 14 patch 0  . nitroGLYCERIN (NITROSTAT) 0.4 MG SL tablet Place 1 tablet (0.4 mg total) under the tongue every  5 (five) minutes x 3 doses as needed for chest pain. 30 tablet 2  . omeprazole (PRILOSEC) 40 MG capsule Take 1 capsule (40 mg total) daily by mouth. 90 capsule 3  . ondansetron (ZOFRAN-ODT) 8 MG disintegrating tablet Take 1 tablet (8 mg total) by mouth 2 (two) times daily. 20 tablet 6  . oxyCODONE (OXY IR/ROXICODONE) 5 MG immediate release tablet Take 5 mg by mouth every 4 (four) hours as needed for severe pain.    . pantoprazole (PROTONIX) 40 MG tablet Take 1 tablet (40 mg total) by mouth daily. 30 tablet 1  . rizatriptan (MAXALT-MLT) 10 MG disintegrating tablet Take 1 tablet (10 mg total) by mouth as needed. May repeat in 2 hours if needed 15 tablet 6  . Tiotropium Bromide Monohydrate (SPIRIVA RESPIMAT) 2.5 MCG/ACT AERS Inhale 1 puff into the lungs daily. 4 g 2  . tiZANidine (ZANAFLEX) 2 MG tablet Take by mouth every 6 (six) hours as needed for muscle spasms.     No current facility-administered medications for this visit.     Allergies as of 02/25/2018 - Review Complete 02/20/2018  Allergen Reaction Noted  . Levofloxacin Hives and Swelling 02/27/2012  . Flexeril [cyclobenzaprine] Hives, Swelling, and Other (See Comments) 11/13/2013  . Keflex [cephalexin] Hives 10/22/2010  . Ketorolac  10/22/2010  . Phenergan [promethazine hcl] Other (See Comments) 02/21/2017  . Toradol [ketorolac tromethamine] Swelling and Other (See Comments) 01/17/2015  . Tramadol Hives, Swelling, and Other (See Comments) 01/17/2015  . Zoloft [sertraline hcl] Swelling and Other (See Comments) 05/31/2015    ROS:  General: Negative for anorexia, weight loss, fever, chills, fatigue, weakness. ENT: Negative for hoarseness, difficulty swallowing , nasal congestion. CV: Negative for chest pain, angina, palpitations, dyspnea on exertion, peripheral edema.  Respiratory: Negative for dyspnea at rest, dyspnea on exertion, cough, sputum, wheezing.  GI: See history of present illness. GU:  Negative for dysuria, hematuria,  urinary incontinence, urinary frequency, nocturnal urination.  Endo: Negative for unusual weight change.    Physical Examination:   There were no vitals taken for this visit.  General: Well-nourished, well-developed in no acute distress.  Eyes: No icterus. Conjunctivae pink. Mouth: Oropharyngeal mucosa moist and pink , no lesions erythema or exudate. Lungs: Clear to auscultation bilaterally. Non-labored. Heart: Regular rate and rhythm, no murmurs rubs or gallops.  Abdomen: Bowel sounds are normal, nontender, nondistended, no hepatosplenomegaly or masses, no abdominal bruits or hernia , no rebound or guarding.   Extremities: No lower extremity edema. No clubbing or deformities. Neuro: Alert and oriented x 3.  Grossly intact. Skin: Warm and dry, no jaundice.   Psych: Alert and cooperative, normal mood and affect.   Imaging Studies: US Abdomen Limited Ruq  Result Date: 02/20/2018 CLINICAL DATA:  Upper abdominal pain for 3 days. EXAM: ULTRASOUND ABDOMEN  LIMITED RIGHT UPPER QUADRANT COMPARISON:  CT abdomen and pelvis March 17, 2017 and abdominal ultrasound March 15, 2007 renal function FINDINGS: Gallbladder: No gallstones or wall thickening visualized. No sonographic Murphy sign noted by sonographer. Common bile duct: Diameter: 2.5 mm Liver: No focal lesion identified. Normal parenchymal echogenicity. Portal vein is patent on color Doppler imaging with normal direction of blood flow towards the liver. IMPRESSION: Negative RIGHT upper quadrant ultrasound. Electronically Signed   By: Elon Alas M.D.   On: 02/20/2018 22:50    Assessment and Plan:   TRIVA HUEBER is a 52 y.o. y/o female here to follow upfor hepatitis C. GT 3 No biochemical or radiological evidence of cirrhosis.  back.Completed treatment  .Main issue is epigastric pain today , likely either one or a combination of issues from aerophagia due to smoking +/-  IBS-C+/- SIBO   Plan  1.Stop smoking  2. Hep C  viral PCR, check Hep A/B immune status post vaccination  3. H pylori breath test  4. Linzess 72 mcg - samples provided- if no better despite having better bowel movements , then will get a breath test for SIBO, CT scan of abdomen , if not having good bowel movements then will increase dose  5. Continue Protonix.    Dr [name]  MD,MRCP [facility]) Follow up in 4 weeks

## 2018-02-25 NOTE — Addendum Note (Signed)
Addended by: Earl Lagos on: 02/25/2018 11:33 AM   Modules accepted: Orders

## 2018-02-26 ENCOUNTER — Telehealth: Payer: Self-pay

## 2018-02-27 ENCOUNTER — Ambulatory Visit: Payer: Self-pay | Admitting: *Deleted

## 2018-02-27 DIAGNOSIS — F17200 Nicotine dependence, unspecified, uncomplicated: Secondary | ICD-10-CM

## 2018-02-27 DIAGNOSIS — I10 Essential (primary) hypertension: Secondary | ICD-10-CM

## 2018-02-27 DIAGNOSIS — E785 Hyperlipidemia, unspecified: Secondary | ICD-10-CM

## 2018-02-27 LAB — H. PYLORI BREATH TEST: H pylori Breath Test: NEGATIVE

## 2018-02-27 LAB — HEPATITIS A ANTIBODY, TOTAL: Hep A Total Ab: POSITIVE — AB

## 2018-02-27 LAB — HCV RNA QUANT: Hepatitis C Quantitation: NOT DETECTED IU/mL

## 2018-02-27 NOTE — Progress Notes (Signed)
  Chronic Care Management   Telephone Note   02/27/2018 Name: Sara Munoz MRN: [id number] DOB: 1965-12-07  Referred by: [name], [name] Reason for referral : Chronic Care Management (Initial Assessment/New Referral)   Subjective:"I'll take all the help I can get"  Objective:  BP Readings from Last 3 Encounters:  02/25/18 (!) 150/91  02/20/18 136/89  02/20/18 96/60   Lab Results  Component Value Date   LDLCALC 161 (H) 02/20/2018    Assessment: Sara Munoz is a 52 year old female patient referred by her primary care provider Marton Redwood [name] to the [facility] Program for assistance with chronic disease management. Sara Munoz has a medical history which includes HTN, HLD, Nicotine dependence, TIA, and chronic pain.   Chronic Disease Management Needs - Sara Munoz admits knowledge deficits regarding self health management activities especially around management of hypertension and smoking cessation.   Goals:  Goals Addressed            This Visit's Progress   . "I would really like to make sure I'm staying on top of all my health problems so I can stay well" (pt-stated)        Clinical Goals: Over the next 60 days, the CCM Team will collaborate with and support Sara Munoz as she works toward Contractor of chronic diseases.   Interventions:  Sara Munoz information about Chronic Care Management services today including:  1. CCM service includes personalized support from designated clinical staff supervised by her physician, including individualized plan of care and coordination with other care providers 2. 24/7 contact phone numbers for assistance for urgent and routine care needs. 3. Service will only be billed when office clinical staff spend 20 minutes or more in a month to coordinate care. 4. Only one practitioner may furnish and bill the service in a calendar month. 5. The patient may stop CCM services at any time (effective at the  end of the month) by phone call to the office staff. 6. The patient will be responsible for cost sharing (co-pay) of up to 20% of the service fee (after annual deductible is met).  Patient agreed to services and verbal consent obtained.  Plan: I have scheduled Sara Munoz for an office visit on Wednesday 03/18/18 in collaboration with clinic pharmacist.   [facility] [phone]

## 2018-02-27 NOTE — Patient Instructions (Signed)
CCM (Chronic Care Management) Team  [place] Nurse Care Coordinator  [phone]   Ruben Reason PharmD  Clinical Pharmacist  [phone]

## 2018-03-05 ENCOUNTER — Telehealth: Payer: Self-pay

## 2018-03-05 NOTE — Telephone Encounter (Signed)
Pt returned call. I informed her that you wanted to give the lab results. Pls call

## 2018-03-05 NOTE — Telephone Encounter (Signed)
-----   Message from [name], MD sent at 03/01/2018  5:50 PM EDT ----- Sherald Hess inform H pylori test test is negative, Hep C test is also negative

## 2018-03-05 NOTE — Telephone Encounter (Signed)
Called pt to inform her of lab results. No answer, VM is full

## 2018-03-06 NOTE — Telephone Encounter (Signed)
Spoke with pt and informed her of lab results  

## 2018-03-11 DIAGNOSIS — M25511 Pain in right shoulder: Secondary | ICD-10-CM | POA: Diagnosis not present

## 2018-03-11 DIAGNOSIS — M79622 Pain in left upper arm: Secondary | ICD-10-CM | POA: Diagnosis not present

## 2018-03-11 DIAGNOSIS — M545 Low back pain: Secondary | ICD-10-CM | POA: Diagnosis not present

## 2018-03-11 DIAGNOSIS — M79621 Pain in right upper arm: Secondary | ICD-10-CM | POA: Diagnosis not present

## 2018-03-11 DIAGNOSIS — G894 Chronic pain syndrome: Secondary | ICD-10-CM | POA: Diagnosis not present

## 2018-03-11 DIAGNOSIS — M79662 Pain in left lower leg: Secondary | ICD-10-CM | POA: Diagnosis not present

## 2018-03-11 DIAGNOSIS — M25512 Pain in left shoulder: Secondary | ICD-10-CM | POA: Diagnosis not present

## 2018-03-12 DIAGNOSIS — M5412 Radiculopathy, cervical region: Secondary | ICD-10-CM | POA: Diagnosis not present

## 2018-03-18 ENCOUNTER — Ambulatory Visit: Payer: Self-pay

## 2018-03-18 ENCOUNTER — Ambulatory Visit: Payer: Self-pay | Admitting: *Deleted

## 2018-03-18 NOTE — Chronic Care Management (AMB) (Signed)
  Care Management   Note  03/18/2018 Name: Sara Munoz MRN: [id number] DOB: 03/19/1966   [name] called this morning and left a message stating she needed to reschedule her appointment for today. I reached out to her this morning and have rescheduled her for Monday 03/23/18 @ 10am.   Plan: Initial Office Visit for Case Management services on 03/23/18 @ [facility] [phone]

## 2018-03-19 ENCOUNTER — Ambulatory Visit: Payer: Medicaid Other | Admitting: Neurology

## 2018-03-23 ENCOUNTER — Ambulatory Visit: Payer: Self-pay

## 2018-03-25 ENCOUNTER — Other Ambulatory Visit: Payer: Self-pay

## 2018-03-25 ENCOUNTER — Encounter: Payer: Self-pay | Admitting: Emergency Medicine

## 2018-03-25 ENCOUNTER — Emergency Department
Admission: EM | Admit: 2018-03-25 | Discharge: 2018-03-25 | Disposition: A | Discharge status: Home / Self Care | Payer: Medicaid Other | Attending: Emergency Medicine | Admitting: Emergency Medicine

## 2018-03-25 DIAGNOSIS — R42 Dizziness and giddiness: Secondary | ICD-10-CM | POA: Diagnosis not present

## 2018-03-25 DIAGNOSIS — M549 Dorsalgia, unspecified: Secondary | ICD-10-CM | POA: Insufficient documentation

## 2018-03-25 DIAGNOSIS — Z5321 Procedure and treatment not carried out due to patient leaving prior to being seen by health care provider: Secondary | ICD-10-CM | POA: Diagnosis not present

## 2018-03-25 DIAGNOSIS — W19XXXA Unspecified fall, initial encounter: Secondary | ICD-10-CM | POA: Diagnosis not present

## 2018-03-25 LAB — URINALYSIS, COMPLETE (UACMP) WITH MICROSCOPIC
Bilirubin Urine: NEGATIVE
Glucose, UA: NEGATIVE mg/dL
Hgb urine dipstick: NEGATIVE
Ketones, ur: NEGATIVE mg/dL
Nitrite: NEGATIVE
Protein, ur: NEGATIVE mg/dL
Specific Gravity, Urine: 1.002 — ABNORMAL LOW (ref 1.005–1.030)
pH: 6 (ref 5.0–8.0)

## 2018-03-25 LAB — BASIC METABOLIC PANEL
Anion gap: 9 (ref 5–15)
BUN: 8 mg/dL (ref 6–20)
CO2: 24 mmol/L (ref 22–32)
Calcium: 8.8 mg/dL — ABNORMAL LOW (ref 8.9–10.3)
Chloride: 100 mmol/L (ref 98–111)
Creatinine, Ser: 0.69 mg/dL (ref 0.44–1.00)
GFR calc Af Amer: >60 mL/min (ref >60)
GFR calc non Af Amer: >60 mL/min (ref >60)
Glucose, Bld: 92 mg/dL (ref 70–99)
Potassium: 3.7 mmol/L (ref 3.5–5.1)
Sodium: 133 mmol/L — ABNORMAL LOW (ref 135–145)

## 2018-03-25 LAB — CBC
HCT: 38.3 % (ref 35.0–47.0)
Hemoglobin: 13.4 g/dL (ref 12.0–16.0)
MCH: 30 pg (ref 26.0–34.0)
MCHC: 35 g/dL (ref 32.0–36.0)
MCV: 85.7 fL (ref 80.0–100.0)
Platelets: 189 10*3/uL (ref 150–440)
RBC: 4.46 MIL/uL (ref 3.80–5.20)
RDW: 17.2 % — ABNORMAL HIGH (ref 11.5–14.5)
WBC: 7.8 10*3/uL (ref 3.6–11.0)

## 2018-03-25 NOTE — ED Triage Notes (Signed)
Pt presents to ED via EMS after she had a sudden onset of dizziness and fell landing on 3 concrete stairs with her back. Pt now c/o back pain to her lower and upper back. Denies hitting her head. Pt reports having a hx of chronic back pain and is scheduled to have neck surgery in the next two weeks at [facility]. Denies hitting her head. Pt ambulatory on scene but pt reports it was very painful.

## 2018-03-25 NOTE — ED Notes (Signed)
Patient states she has to leave because her boyfriend is her only way to get home.  This RN apologized for wait and encouraged patient to stay.  Patient states she will return if her pain increases.

## 2018-03-25 NOTE — ED Notes (Signed)
First Nurse Note:  Patient presents to the ED via EMS from home.  Patient has history of chronic back pain, migraines and dizziness.  Per EMS patient was having a "dizzy spell" and missed a step and fell.  Denies loss of conscious and was ambulatory on scene per EMS.

## 2018-03-26 ENCOUNTER — Ambulatory Visit: Payer: Medicaid Other

## 2018-03-26 DIAGNOSIS — M79605 Pain in left leg: Secondary | ICD-10-CM

## 2018-03-26 DIAGNOSIS — F419 Anxiety disorder, unspecified: Secondary | ICD-10-CM

## 2018-03-26 DIAGNOSIS — G8929 Other chronic pain: Secondary | ICD-10-CM

## 2018-03-26 DIAGNOSIS — F32A Depression, unspecified: Secondary | ICD-10-CM

## 2018-03-26 DIAGNOSIS — F329 Major depressive disorder, single episode, unspecified: Secondary | ICD-10-CM

## 2018-03-26 DIAGNOSIS — M79604 Pain in right leg: Secondary | ICD-10-CM

## 2018-03-26 DIAGNOSIS — E785 Hyperlipidemia, unspecified: Secondary | ICD-10-CM

## 2018-03-26 DIAGNOSIS — J42 Unspecified chronic bronchitis: Secondary | ICD-10-CM

## 2018-03-26 DIAGNOSIS — I10 Essential (primary) hypertension: Secondary | ICD-10-CM

## 2018-03-26 NOTE — Chronic Care Management (AMB) (Addendum)
Chronic Care Management   Initial Visit Note  03/26/2018 Name: Sara Munoz MRN: [id number] DOB: 11/04/1965  Referred by: [name], [name] Reason for referral : Chronic Care Management (Pain Mgmnt, COPD, Falls)   Subjective: "I want to get better before my neck surgery"  Objective:  Medications Reviewed Today    Reviewed by Vanetta Mulders, CMA (Certified Medical Assistant) on 02/25/18 at 1104  Med List Status: <None>  Medication Order Taking? Sig Documenting Provider Last Dose Status Informant  albuterol (PROVENTIL HFA;VENTOLIN HFA) 108 (90 Base) MCG/ACT inhaler [id number] Yes Inhale 2 puffs into the lungs every 6 (six) hours as needed for wheezing or shortness of breath. [name], [name] Taking Active   alum & mag hydroxide-simeth (MAALOX/MYLANTA) 200-200-20 MG/5ML suspension [id number] Yes Take 30 mLs by mouth every 4 (four) hours as needed for indigestion or heartburn. Harvest Dark, MD Taking Active   aspirin EC 81 MG tablet [id number] Yes Take 81 mg by mouth daily. [provider] Taking Active Self  atorvastatin (LIPITOR) 20 MG tablet [id number] No Take 20 mg by mouth at bedtime. [provider] Not Taking Active Self  budesonide-formoterol (SYMBICORT) 160-4.5 MCG/ACT inhaler [id number] Yes Inhale 2 puffs into the lungs 2 (two) times daily. [name], [name] Taking Active   CARAFATE 1 GM/10ML suspension [id number] Yes TAKE 10 MLS (1 G TOTAL) BY MOUTH 4 (FOUR) TIMES DAILY. [name], MD Taking Active   docusate sodium (COLACE) 100 MG capsule [id number] Yes Take 200 mg by mouth 2 (two) times daily as needed for mild constipation. [provider] Taking Active Self  DULoxetine (CYMBALTA) 60 MG capsule [id number] Yes Take 1 capsule (60 mg total) by mouth daily. [name], MD Taking Active   gabapentin (NEURONTIN) 600 MG tablet [id number]  Take 1 tablet (600 mg total) by mouth 3 (three) times daily. [name], [name]  Expired 02/20/18  2359   lisinopril (PRINIVIL,ZESTRIL) 40 MG tablet [id number] Yes Take 1 tablet (40 mg total) by mouth daily. [name], [name] Taking Active   metoprolol tartrate (LOPRESSOR) 25 MG tablet [id number] Yes TAKE 1 TABLET BY MOUTH TWICE A DAY [name], [name] Taking Active   Naloxone HCl [facility] IJ) [id number] Yes Inject as directed as needed. [provider] Taking Active   nicotine (NICODERM CQ - DOSED IN MG/24 HOURS) 14 mg/24hr patch [id number] Yes Place 1 patch (14 mg total) onto the skin daily for 14 days. [name], [name] Taking Active   nicotine (NICODERM CQ - DOSED IN MG/24 HOURS) 21 mg/24hr patch [id number] Yes Place 1 patch (21 mg total) onto the skin daily. [name], [name] Taking Active   nicotine (NICODERM CQ) 7 mg/24hr patch [id number] Yes Place 1 patch (7 mg total) onto the skin daily for 14 days. [name], [name] Taking Active   nitroGLYCERIN (NITROSTAT) 0.4 MG SL tablet [id number] Yes Place 1 tablet (0.4 mg total) under the tongue every 5 (five) minutes x 3 doses as needed for chest pain. Demetrios Loll, MD Taking Active Self  omeprazole (PRILOSEC) 40 MG capsule [id number] Yes Take 1 capsule (40 mg total) daily by mouth. [name], MD Taking Active Self  ondansetron (ZOFRAN-ODT) 8 MG disintegrating tablet [id number] Yes Take 1 tablet (8 mg total) by mouth 2 (two) times daily. [name], MD Taking Active   oxyCODONE (OXY IR/ROXICODONE) 5 MG immediate release tablet [id number] Yes Take 5 mg by mouth every 4 (four) hours as needed for severe pain.  [provider] Taking Active Self  pantoprazole (PROTONIX) 40 MG tablet [id number] No Take 1 tablet (40 mg total) by mouth daily.  Patient not taking:  Reported on 02/25/2018   Harvest Dark, MD Not Taking Active   rizatriptan (MAXALT-MLT) 10 MG disintegrating tablet [id number] Yes Take 1 tablet (10 mg total) by mouth as needed. May repeat in 2 hours if needed [name], MD Taking Active   Tiotropium  Bromide Monohydrate (SPIRIVA RESPIMAT) 2.5 MCG/ACT AERS [id number] Yes Inhale 1 puff into the lungs daily. [name], [name] Taking Active   tiZANidine (ZANAFLEX) 2 MG tablet [id number] Yes Take by mouth every 6 (six) hours as needed for muscle spasms. [provider] Taking Active   Med List Note Corena Herter 10/09/15 2205): Pt states that she hasn't had the majority of her medications in three weeks.  Questioned pt on Metoprolol/Atenolol and Diazepam/Clonazepam/Alprazolam and pt states that she is on all of them.              Assessment: 52 year old female presenting to initial CCM office visit. Of note, patient was in the ED following a fall on concrete steps, but left without being seen. 60 pack year smoking history. Reports falls at least once per week. Upcoming neck surgery.   Goals Addressed            This Visit's Progress   . "I don't know why I keep falling and don't want to fall" (pt-stated)       Patient reports at least once weekly falls that she states happen when "out of nowhere" she becomes dizzy or lightheaded. She last fell yesterday evening when she was outside with her dogs. She reports becoming dizzy and falling on the concrete steps of her home. She has related back pain today. I checked sitting/standing (orthostatic) bp's in the office today: sitting 124/80 standing 118/78. She did not experience dizziness when bp was being checked  Clinical Goals: Over the next 14 days, patient will utilize fall risk reduction strategies taught during today's visit as evidenced by patient's report of same  Interventions: fall risk reduction strategies taught to patient; update re: falls/dizziness sent to neurosurgery team, PCP, and cardiology     . "I want to get my neck and back pain under control" (pt-stated)       Patient reports that she is waiting to hear from Neurosurgery office re: Medicaid approval for cervical spine surgery [facility]  Neurosurgery). She is seen in the [facility] pain [facility] clinic where she is under pain contract. Her medications were reviewed today with the Clinic Pharmacist Ruben Reason PharmD. Patient reports low back pain today r/t fall last evening (see problem/goals r/t fall). Post surgery plan - Ms. Jacquet says her boyfriend will not be at home most of the day because of his work schedule. Her brother is going to stay with her for at least one week. Otherwise, she has limited help post surgically. She does say that she will have food in the house after surgery and is not concerned about food.   Clinical Goals: Over the next 7 days, patient will report updated information from neurosurgery office r/t scheduling of ne ck surgery/Medicaid approval  Interventions: discussed neurosurgery plans with patient and offered additional support/collaboration with neurosurgery office if she has questions or needs assistance               Drug Therapy Interventions:   Adherence: Patient Prefers Not to Take  Needs  Additional Therapy: Preventative Therapy: needs new script for statin, nitroglycerin sublingual; taking albuterol rescue inhaler 2-3 times per day   Unnecessary Drug Therapy: Duplicate Therapy: omeprazole (filled 02/17/18) and pantoprazole (filled 02/20/18); HIGH dose of gabapentin, patient experiencing weekly falls (additionally on oxycodone and tizanidine)  Plan: Contact Provider: Carles Collet for new statin, NitroStat prescription, PPI duplicate therapy Counsel Patient on Adherence, Tobacco Cessation, and annual influenza vaccine  Updated Medication List  Additionally discussed pain plan for post surgery. Patient has a plan in place so that she will not break pain contract with current pain management clinic. Boyfriend knows how to use naloxone kit (automated).    Written patient instructions provided.  Total time in face to face counseling 50 minutes.    Follow up CCM Visit 2 weeks  Ruben Reason, PharmD Clinical Pharmacist [place] [phone]

## 2018-03-26 NOTE — Chronic Care Management (AMB) (Signed)
Care Management   Initial Visit Note  03/26/2018 Name: Sara Munoz MRN: [id number] DOB: [birth year]  Referred by: [name], [name] Reason for referral : Chronic Care Management (Pain Mgmnt, COPD, Falls)   Subjective: "I mostly need help with my pain and preparing for my neck surgery  Assessment: Sara Munoz is a 52 year old primary care patient of Creig Hines [name] who was referred to the Care Management team for assistance with care coordination and disease management and medication management/assessment in anticipation of upcoming neck surgery.   Sara Munoz was seen in the office today for her initial case management visit.   Goals Addressed    . "I don't know why I keep falling and don't want to fall" (pt-stated)       Patient reports at least once weekly falls that she states happen when "out of nowhere" she becomes dizzy or lightheaded. She last fell yesterday evening when she was outside with her dogs. She reports becoming dizzy and falling on the concrete steps of her home. She has related back pain today. I checked sitting/standing (orthostatic) bp's in the office today: sitting 124/80 standing 118/78. She did not experience dizziness when bp was being checked  Clinical Goals: Over the next 14 days, patient will utilize fall risk reduction strategies taught during today's visit as evidenced by patient's report of same  Interventions: fall risk reduction strategies taught to patient; update re: falls/dizziness sent to neurosurgery team, PCP, and cardiology     . "I want to get my neck and back pain under control" (pt-stated)       Patient reports that she is waiting to hear from Neurosurgery office re: Medicaid approval for cervical spine surgery [facility] Neurosurgery). She is seen in the [facility] pain management clinic where she is under pain contract. Her medications were reviewed today with the Clinic Pharmacist Ruben Reason PharmD. Patient reports low back pain  today r/t fall last evening (see problem/goals r/t fall). Post surgery plan - Sara Munoz says her boyfriend will not be at home most of the day because of his work schedule. Her brother is going to stay with her for at least one week. Otherwise, she has limited help post surgically. She does say that she will have food in the house after surgery and is not concerned about food.   Clinical Goals: Over the next 7 days, patient will report updated information from neurosurgery office r/t scheduling of ne ck surgery/Medicaid approval  Interventions: discussed neurosurgery plans with patient and offered additional support/collaboration with neurosurgery office if she has questions or needs assistance     . COMPLETED: "I would really like to make sure I'm staying on top of all my health problems so I can stay well" (pt-stated)       Plan: I will reach out to Sara Munoz by phone over the next 2 weeks to follow up on surgery plans and falls.   [facility] Care Management [phone]    Sara Munoz was given information about Care Management services today including:  1. Case Management services include personalized support from designated clinical staff supervised by a physician, including individualized plan of care and coordination with other care providers 2. 24/7 contact phone numbers for assistance for urgent and routine care needs. 3. The patient may stop CCM services at any time (effective at the end of the month) by phone call to the office staff.  Patient agreed  to services and verbal consent obtained.

## 2018-03-26 NOTE — Patient Instructions (Signed)
1. Call your neurosurgeon as recommended by Friday to see if they have heard from Medicaid 2. Talk with family/friends about who might be able to help out after surgery.  3. Remember to find something stationary to look at when you get up from bed or a chair. TAKE YOUR TIME! 4. Almyra Free (pharmacist) will make sure you have all the refills you need.  5. Call Delrae Rend (nurse) or Almyra Free (pharmacist) if you have questions or need other help related to your health.   CM (Care Management) Team    [place] Nurse Care Coordinator  [phone]   Ruben Reason PharmD  Clinical Pharmacist  [phone]  Sara Munoz was given information about Care Management services today including:  1. Case Management services includes personalized support from designated clinical staff supervised by her physician, including individualized plan of care and coordination with other care providers 2. 24/7 contact phone numbers for assistance for urgent and routine care needs. 3. The patient may stop case management services at any time by phone call to the office staff.  Patient agreed to services and verbal consent obtained.

## 2018-03-27 ENCOUNTER — Other Ambulatory Visit: Payer: Self-pay | Admitting: Neurological Surgery

## 2018-03-27 NOTE — Progress Notes (Signed)
Pre-op instructions provided only. Please complete assessment on DOS. Pt made aware to stop taking Aspirin, vitamins, fish oil and herbal medications. Do not take any NSAIDs ie: Ibuprofen, Advil, Naproxen  (Aleve), Motrin, BC and Goody Powder. Pt verbalized understanding of all pre-op instructions.

## 2018-03-30 ENCOUNTER — Encounter (HOSPITAL_COMMUNITY): Payer: Self-pay

## 2018-03-30 ENCOUNTER — Encounter (HOSPITAL_COMMUNITY)
Admission: RE | Disposition: A | Discharge status: Home / Self Care | Payer: Self-pay | Source: Ambulatory Visit | Attending: Neurological Surgery

## 2018-03-30 ENCOUNTER — Ambulatory Visit (HOSPITAL_COMMUNITY): Payer: Medicaid Other

## 2018-03-30 ENCOUNTER — Ambulatory Visit (HOSPITAL_COMMUNITY): Payer: Medicaid Other | Admitting: Certified Registered Nurse Anesthetist

## 2018-03-30 ENCOUNTER — Ambulatory Visit: Payer: Self-pay | Admitting: *Deleted

## 2018-03-30 ENCOUNTER — Observation Stay (HOSPITAL_COMMUNITY)
Admission: RE | Admit: 2018-03-30 | Discharge: 2018-03-31 | Disposition: A | Discharge status: Home / Self Care | Payer: Medicaid Other | Source: Ambulatory Visit | Attending: Neurological Surgery | Admitting: Neurological Surgery

## 2018-03-30 ENCOUNTER — Other Ambulatory Visit: Payer: Self-pay

## 2018-03-30 ENCOUNTER — Telehealth: Payer: Self-pay

## 2018-03-30 DIAGNOSIS — J449 Chronic obstructive pulmonary disease, unspecified: Secondary | ICD-10-CM | POA: Diagnosis not present

## 2018-03-30 DIAGNOSIS — I10 Essential (primary) hypertension: Secondary | ICD-10-CM | POA: Insufficient documentation

## 2018-03-30 DIAGNOSIS — I252 Old myocardial infarction: Secondary | ICD-10-CM | POA: Diagnosis not present

## 2018-03-30 DIAGNOSIS — Z419 Encounter for procedure for purposes other than remedying health state, unspecified: Secondary | ICD-10-CM

## 2018-03-30 DIAGNOSIS — M4802 Spinal stenosis, cervical region: Secondary | ICD-10-CM | POA: Diagnosis not present

## 2018-03-30 DIAGNOSIS — Z8673 Personal history of transient ischemic attack (TIA), and cerebral infarction without residual deficits: Secondary | ICD-10-CM | POA: Diagnosis not present

## 2018-03-30 DIAGNOSIS — M47816 Spondylosis without myelopathy or radiculopathy, lumbar region: Secondary | ICD-10-CM

## 2018-03-30 DIAGNOSIS — Z8249 Family history of ischemic heart disease and other diseases of the circulatory system: Secondary | ICD-10-CM | POA: Insufficient documentation

## 2018-03-30 DIAGNOSIS — K219 Gastro-esophageal reflux disease without esophagitis: Secondary | ICD-10-CM | POA: Diagnosis not present

## 2018-03-30 DIAGNOSIS — I739 Peripheral vascular disease, unspecified: Secondary | ICD-10-CM | POA: Diagnosis not present

## 2018-03-30 DIAGNOSIS — M1990 Unspecified osteoarthritis, unspecified site: Secondary | ICD-10-CM | POA: Insufficient documentation

## 2018-03-30 DIAGNOSIS — M50123 Cervical disc disorder at C6-C7 level with radiculopathy: Principal | ICD-10-CM | POA: Insufficient documentation

## 2018-03-30 DIAGNOSIS — F17210 Nicotine dependence, cigarettes, uncomplicated: Secondary | ICD-10-CM | POA: Insufficient documentation

## 2018-03-30 DIAGNOSIS — M4322 Fusion of spine, cervical region: Secondary | ICD-10-CM | POA: Diagnosis not present

## 2018-03-30 DIAGNOSIS — M5412 Radiculopathy, cervical region: Secondary | ICD-10-CM | POA: Diagnosis not present

## 2018-03-30 DIAGNOSIS — I2510 Atherosclerotic heart disease of native coronary artery without angina pectoris: Secondary | ICD-10-CM | POA: Diagnosis not present

## 2018-03-30 HISTORY — PX: ANTERIOR CERVICAL DECOMP/DISCECTOMY FUSION: SHX1161

## 2018-03-30 LAB — TYPE AND SCREEN
ABO/RH(D): O POS
Antibody Screen: NEGATIVE

## 2018-03-30 LAB — POCT PREGNANCY, URINE: Preg Test, Ur: NEGATIVE

## 2018-03-30 LAB — ABO/RH: ABO/RH(D): O POS

## 2018-03-30 SURGERY — ANTERIOR CERVICAL DECOMPRESSION/DISCECTOMY FUSION 1 LEVEL
Anesthesia: General | Site: Neck

## 2018-03-30 MED ORDER — ONDANSETRON HCL 4 MG/2ML IJ SOLN
INTRAMUSCULAR | Status: AC
  Filled 2018-03-30: qty 2

## 2018-03-30 MED ORDER — DEXAMETHASONE SODIUM PHOSPHATE 10 MG/ML IJ SOLN
INTRAMUSCULAR | Status: DC | PRN
  Administered 2018-03-30: 10 mg via INTRAVENOUS

## 2018-03-30 MED ORDER — DULOXETINE HCL 30 MG PO CPEP
60.0000 mg | ORAL_CAPSULE | Freq: Every day | ORAL | Status: DC
  Administered 2018-03-30: 60 mg via ORAL
  Filled 2018-03-30: qty 2

## 2018-03-30 MED ORDER — LISINOPRIL 20 MG PO TABS
40.0000 mg | ORAL_TABLET | Freq: Every day | ORAL | Status: DC
  Administered 2018-03-30: 40 mg via ORAL
  Filled 2018-03-30: qty 2

## 2018-03-30 MED ORDER — OXYCODONE HCL 5 MG PO TABS: 5.0000 mg | ORAL_TABLET | ORAL | Status: DC | PRN

## 2018-03-30 MED ORDER — DOCUSATE SODIUM 100 MG PO CAPS: 200.0000 mg | ORAL_CAPSULE | Freq: Two times a day (BID) | ORAL | Status: DC | PRN

## 2018-03-30 MED ORDER — LIDOCAINE-EPINEPHRINE 1 %-1:100000 IJ SOLN
INTRAMUSCULAR | Status: AC
  Filled 2018-03-30: qty 1

## 2018-03-30 MED ORDER — PANTOPRAZOLE SODIUM 40 MG PO TBEC: 40.0000 mg | DELAYED_RELEASE_TABLET | Freq: Every day | ORAL | Status: DC

## 2018-03-30 MED ORDER — FENTANYL CITRATE (PF) 100 MCG/2ML IJ SOLN
INTRAMUSCULAR | Status: AC
  Filled 2018-03-30: qty 2

## 2018-03-30 MED ORDER — MIDAZOLAM HCL 2 MG/2ML IJ SOLN
INTRAMUSCULAR | Status: AC
  Filled 2018-03-30: qty 2

## 2018-03-30 MED ORDER — MENTHOL 3 MG MT LOZG: 1.0000 | LOZENGE | OROMUCOSAL | Status: DC | PRN

## 2018-03-30 MED ORDER — PHENYLEPHRINE 40 MCG/ML (10ML) SYRINGE FOR IV PUSH (FOR BLOOD PRESSURE SUPPORT)
PREFILLED_SYRINGE | INTRAVENOUS | Status: DC | PRN
  Administered 2018-03-30: 80 ug via INTRAVENOUS
  Administered 2018-03-30: 40 ug via INTRAVENOUS

## 2018-03-30 MED ORDER — DEXAMETHASONE SODIUM PHOSPHATE 10 MG/ML IJ SOLN
INTRAMUSCULAR | Status: AC
  Filled 2018-03-30: qty 1

## 2018-03-30 MED ORDER — HYDROMORPHONE HCL 1 MG/ML IJ SOLN
INTRAMUSCULAR | Status: DC | PRN
  Administered 2018-03-30: 0.5 mg via INTRAVENOUS

## 2018-03-30 MED ORDER — ONDANSETRON HCL 4 MG/2ML IJ SOLN
INTRAMUSCULAR | Status: DC | PRN
  Administered 2018-03-30: 4 mg via INTRAVENOUS

## 2018-03-30 MED ORDER — ONDANSETRON HCL 4 MG/2ML IJ SOLN: 4.0000 mg | Freq: Four times a day (QID) | INTRAMUSCULAR | Status: DC | PRN

## 2018-03-30 MED ORDER — PROMETHAZINE HCL 25 MG/ML IJ SOLN: 6.2500 mg | INTRAMUSCULAR | Status: DC | PRN

## 2018-03-30 MED ORDER — VANCOMYCIN HCL IN DEXTROSE 1-5 GM/200ML-% IV SOLN
1000.0000 mg | Freq: Once | INTRAVENOUS | Status: AC
  Administered 2018-03-30: 1000 mg via INTRAVENOUS
  Filled 2018-03-30: qty 200

## 2018-03-30 MED ORDER — ONDANSETRON HCL 4 MG PO TABS: 4.0000 mg | ORAL_TABLET | Freq: Four times a day (QID) | ORAL | Status: DC | PRN

## 2018-03-30 MED ORDER — ACETAMINOPHEN 500 MG PO TABS
1000.0000 mg | ORAL_TABLET | Freq: Four times a day (QID) | ORAL | Status: DC
  Administered 2018-03-30 – 2018-03-31 (×3): 1000 mg via ORAL
  Filled 2018-03-30 (×3): qty 2

## 2018-03-30 MED ORDER — TIZANIDINE HCL 4 MG PO TABS
4.0000 mg | ORAL_TABLET | Freq: Every day | ORAL | Status: DC
  Administered 2018-03-30: 4 mg via ORAL
  Filled 2018-03-30: qty 1

## 2018-03-30 MED ORDER — FENTANYL CITRATE (PF) 100 MCG/2ML IJ SOLN
INTRAMUSCULAR | Status: DC | PRN
  Administered 2018-03-30 (×5): 50 ug via INTRAVENOUS

## 2018-03-30 MED ORDER — ROCURONIUM BROMIDE 50 MG/5ML IV SOSY
PREFILLED_SYRINGE | INTRAVENOUS | Status: DC | PRN
  Administered 2018-03-30: 40 mg via INTRAVENOUS
  Administered 2018-03-30 (×2): 10 mg via INTRAVENOUS
  Administered 2018-03-30: 20 mg via INTRAVENOUS
  Administered 2018-03-30: 10 mg via INTRAVENOUS

## 2018-03-30 MED ORDER — HEMOSTATIC AGENTS (NO CHARGE) OPTIME
TOPICAL | Status: DC | PRN
  Administered 2018-03-30: 1 via TOPICAL

## 2018-03-30 MED ORDER — MEPERIDINE HCL 50 MG/ML IJ SOLN: 6.2500 mg | INTRAMUSCULAR | Status: DC | PRN

## 2018-03-30 MED ORDER — CHLORHEXIDINE GLUCONATE CLOTH 2 % EX PADS: 6.0000 | MEDICATED_PAD | Freq: Once | CUTANEOUS | Status: DC

## 2018-03-30 MED ORDER — SODIUM CHLORIDE 0.9 % IV SOLN: INTRAVENOUS | Status: DC

## 2018-03-30 MED ORDER — 0.9 % SODIUM CHLORIDE (POUR BTL) OPTIME
TOPICAL | Status: DC | PRN
  Administered 2018-03-30: 1000 mL

## 2018-03-30 MED ORDER — THROMBIN 5000 UNITS EX SOLR
OROMUCOSAL | Status: DC | PRN
  Administered 2018-03-30: 13:00:00 via TOPICAL

## 2018-03-30 MED ORDER — ACETAMINOPHEN 325 MG PO TABS: 650.0000 mg | ORAL_TABLET | ORAL | Status: DC | PRN

## 2018-03-30 MED ORDER — SODIUM CHLORIDE 0.9% FLUSH: 3.0000 mL | Freq: Two times a day (BID) | INTRAVENOUS | Status: DC

## 2018-03-30 MED ORDER — HYDROMORPHONE HCL 1 MG/ML IJ SOLN
0.5000 mg | INTRAMUSCULAR | Status: DC | PRN
  Administered 2018-03-30 – 2018-03-31 (×3): 0.5 mg via INTRAVENOUS
  Filled 2018-03-30 (×3): qty 0.5

## 2018-03-30 MED ORDER — PHENOL 1.4 % MT LIQD: 1.0000 | OROMUCOSAL | Status: DC | PRN

## 2018-03-30 MED ORDER — NITROGLYCERIN 0.4 MG SL SUBL: 0.4000 mg | SUBLINGUAL_TABLET | SUBLINGUAL | Status: DC | PRN

## 2018-03-30 MED ORDER — VANCOMYCIN HCL IN DEXTROSE 1-5 GM/200ML-% IV SOLN
1000.0000 mg | INTRAVENOUS | Status: AC
  Administered 2018-03-30: 1000 mg via INTRAVENOUS
  Filled 2018-03-30: qty 200

## 2018-03-30 MED ORDER — ACETAMINOPHEN 10 MG/ML IV SOLN
1000.0000 mg | Freq: Once | INTRAVENOUS | Status: DC | PRN
  Administered 2018-03-30: 1000 mg via INTRAVENOUS

## 2018-03-30 MED ORDER — METOPROLOL TARTRATE 25 MG PO TABS
25.0000 mg | ORAL_TABLET | Freq: Two times a day (BID) | ORAL | Status: DC
  Administered 2018-03-30 – 2018-03-31 (×2): 25 mg via ORAL
  Filled 2018-03-30 (×2): qty 1

## 2018-03-30 MED ORDER — LIDOCAINE 2% (20 MG/ML) 5 ML SYRINGE
INTRAMUSCULAR | Status: AC
  Filled 2018-03-30: qty 5

## 2018-03-30 MED ORDER — SODIUM CHLORIDE 0.9% FLUSH: 3.0000 mL | INTRAVENOUS | Status: DC | PRN

## 2018-03-30 MED ORDER — LIDOCAINE-EPINEPHRINE 1 %-1:100000 IJ SOLN
INTRAMUSCULAR | Status: DC | PRN
  Administered 2018-03-30: 4 mL

## 2018-03-30 MED ORDER — LACTATED RINGERS IV SOLN
INTRAVENOUS | Status: DC | PRN
  Administered 2018-03-30 (×2): via INTRAVENOUS

## 2018-03-30 MED ORDER — GABAPENTIN 600 MG PO TABS
600.0000 mg | ORAL_TABLET | Freq: Three times a day (TID) | ORAL | Status: DC
  Administered 2018-03-30: 600 mg via ORAL
  Filled 2018-03-30: qty 1

## 2018-03-30 MED ORDER — SODIUM CHLORIDE 0.9 % IV SOLN
INTRAVENOUS | Status: DC | PRN
  Administered 2018-03-30: 13:00:00

## 2018-03-30 MED ORDER — ACETAMINOPHEN 650 MG RE SUPP: 650.0000 mg | RECTAL | Status: DC | PRN

## 2018-03-30 MED ORDER — PROPOFOL 10 MG/ML IV BOLUS
INTRAVENOUS | Status: DC | PRN
  Administered 2018-03-30: 150 mg via INTRAVENOUS
  Administered 2018-03-30: 50 mg via INTRAVENOUS

## 2018-03-30 MED ORDER — OXYCODONE HCL 5 MG PO TABS
ORAL_TABLET | ORAL | Status: AC
  Filled 2018-03-30: qty 2

## 2018-03-30 MED ORDER — MOMETASONE FURO-FORMOTEROL FUM 200-5 MCG/ACT IN AERO
2.0000 | INHALATION_SPRAY | Freq: Two times a day (BID) | RESPIRATORY_TRACT | Status: DC
  Administered 2018-03-30 – 2018-03-31 (×2): 2 via RESPIRATORY_TRACT
  Filled 2018-03-30: qty 8.8

## 2018-03-30 MED ORDER — FENTANYL CITRATE (PF) 250 MCG/5ML IJ SOLN
INTRAMUSCULAR | Status: AC
  Filled 2018-03-30: qty 5

## 2018-03-30 MED ORDER — SUGAMMADEX SODIUM 200 MG/2ML IV SOLN
INTRAVENOUS | Status: DC | PRN
  Administered 2018-03-30: 200 mg via INTRAVENOUS

## 2018-03-30 MED ORDER — FENTANYL CITRATE (PF) 100 MCG/2ML IJ SOLN
25.0000 ug | INTRAMUSCULAR | Status: DC | PRN
  Administered 2018-03-30 (×3): 50 ug via INTRAVENOUS

## 2018-03-30 MED ORDER — SUCRALFATE 1 GM/10ML PO SUSP
1.0000 g | Freq: Three times a day (TID) | ORAL | Status: DC
  Administered 2018-03-30: 1 g via ORAL
  Filled 2018-03-30 (×4): qty 10

## 2018-03-30 MED ORDER — OXYCODONE HCL 5 MG PO TABS
10.0000 mg | ORAL_TABLET | ORAL | Status: DC | PRN
  Administered 2018-03-30 – 2018-03-31 (×4): 10 mg via ORAL
  Filled 2018-03-30 (×4): qty 2

## 2018-03-30 MED ORDER — TIOTROPIUM BROMIDE MONOHYDRATE 18 MCG IN CAPS
1.0000 | ORAL_CAPSULE | Freq: Every day | RESPIRATORY_TRACT | Status: DC
  Administered 2018-03-31: 18 ug via RESPIRATORY_TRACT
  Filled 2018-03-30: qty 5

## 2018-03-30 MED ORDER — ROCURONIUM BROMIDE 50 MG/5ML IV SOSY
PREFILLED_SYRINGE | INTRAVENOUS | Status: AC
  Filled 2018-03-30: qty 5

## 2018-03-30 MED ORDER — THROMBIN 5000 UNITS EX SOLR
CUTANEOUS | Status: DC | PRN
  Administered 2018-03-30 (×2): 5000 [IU] via TOPICAL

## 2018-03-30 MED ORDER — NICOTINE 21 MG/24HR TD PT24
21.0000 mg | MEDICATED_PATCH | Freq: Every day | TRANSDERMAL | Status: DC
  Administered 2018-03-30: 21 mg via TRANSDERMAL
  Filled 2018-03-30: qty 1

## 2018-03-30 MED ORDER — ACETAMINOPHEN 10 MG/ML IV SOLN
INTRAVENOUS | Status: AC
  Filled 2018-03-30: qty 100

## 2018-03-30 MED ORDER — LIDOCAINE 2% (20 MG/ML) 5 ML SYRINGE
INTRAMUSCULAR | Status: DC | PRN
  Administered 2018-03-30: 100 mg via INTRAVENOUS

## 2018-03-30 MED ORDER — MIDAZOLAM HCL 5 MG/5ML IJ SOLN
INTRAMUSCULAR | Status: DC | PRN
  Administered 2018-03-30: 2 mg via INTRAVENOUS

## 2018-03-30 MED ORDER — ALBUTEROL SULFATE (2.5 MG/3ML) 0.083% IN NEBU: 3.0000 mL | INHALATION_SOLUTION | Freq: Four times a day (QID) | RESPIRATORY_TRACT | Status: DC | PRN

## 2018-03-30 MED ORDER — THROMBIN 5000 UNITS EX SOLR
CUTANEOUS | Status: AC
  Filled 2018-03-30: qty 15000

## 2018-03-30 MED ORDER — HYDROMORPHONE HCL 1 MG/ML IJ SOLN
INTRAMUSCULAR | Status: AC
  Filled 2018-03-30: qty 0.5

## 2018-03-30 SURGICAL SUPPLY — 58 items
BAG DECANTER FOR FLEXI CONT (MISCELLANEOUS) ×3 IMPLANT
BENZOIN TINCTURE PRP APPL 2/3 (GAUZE/BANDAGES/DRESSINGS) IMPLANT
BLADE CLIPPER SURG (BLADE) IMPLANT
BLADE SURG 11 STRL SS (BLADE) ×3 IMPLANT
BUR MATCHSTICK NEURO 3.0 LAGG (BURR) ×3 IMPLANT
CANISTER SUCT 3000ML PPV (MISCELLANEOUS) ×3 IMPLANT
COVER WAND RF STERILE (DRAPES) IMPLANT
DECANTER SPIKE VIAL GLASS SM (MISCELLANEOUS) ×3 IMPLANT
DERMABOND ADVANCED (GAUZE/BANDAGES/DRESSINGS) ×2
DERMABOND ADVANCED .7 DNX12 (GAUZE/BANDAGES/DRESSINGS) ×1 IMPLANT
DRAPE C-ARM 42X72 X-RAY (DRAPES) ×6 IMPLANT
DRAPE HALF SHEET 40X57 (DRAPES) ×3 IMPLANT
DRAPE LAPAROTOMY 100X72 PEDS (DRAPES) ×3 IMPLANT
DRAPE MICROSCOPE LEICA (MISCELLANEOUS) ×3 IMPLANT
DURAPREP 6ML APPLICATOR 50/CS (WOUND CARE) ×3 IMPLANT
ELECT COATED BLADE 2.86 ST (ELECTRODE) ×3 IMPLANT
ELECT REM PT RETURN 9FT ADLT (ELECTROSURGICAL) ×3
ELECTRODE REM PT RTRN 9FT ADLT (ELECTROSURGICAL) ×1 IMPLANT
FLOSEAL 5ML (HEMOSTASIS) IMPLANT
GAUZE 4X4 16PLY RFD (DISPOSABLE) IMPLANT
GLOVE BIO SURGEON STRL SZ7.5 (GLOVE) ×6 IMPLANT
GLOVE BIOGEL PI IND STRL 6.5 (GLOVE) ×1 IMPLANT
GLOVE BIOGEL PI IND STRL 7.5 (GLOVE) ×4 IMPLANT
GLOVE BIOGEL PI INDICATOR 6.5 (GLOVE) ×2
GLOVE BIOGEL PI INDICATOR 7.5 (GLOVE) ×8
GLOVE ECLIPSE 7.0 STRL STRAW (GLOVE) ×3 IMPLANT
GLOVE ECLIPSE 7.5 STRL STRAW (GLOVE) ×3 IMPLANT
GLOVE EXAM NITRILE LRG STRL (GLOVE) IMPLANT
GLOVE EXAM NITRILE XL STR (GLOVE) IMPLANT
GLOVE EXAM NITRILE XS STR PU (GLOVE) IMPLANT
GLOVE SURG SS PI 6.5 STRL IVOR (GLOVE) ×9 IMPLANT
GOWN STRL REUS W/ TWL LRG LVL3 (GOWN DISPOSABLE) ×4 IMPLANT
GOWN STRL REUS W/ TWL XL LVL3 (GOWN DISPOSABLE) ×1 IMPLANT
GOWN STRL REUS W/TWL 2XL LVL3 (GOWN DISPOSABLE) IMPLANT
GOWN STRL REUS W/TWL LRG LVL3 (GOWN DISPOSABLE) ×8
GOWN STRL REUS W/TWL XL LVL3 (GOWN DISPOSABLE) ×2
HEMOSTAT POWDER KIT SURGIFOAM (HEMOSTASIS) ×3 IMPLANT
KIT BASIN OR (CUSTOM PROCEDURE TRAY) ×3 IMPLANT
KIT TURNOVER KIT B (KITS) ×3 IMPLANT
NEEDLE HYPO 22GX1.5 SAFETY (NEEDLE) ×3 IMPLANT
NEEDLE SPNL 18GX3.5 QUINCKE PK (NEEDLE) ×3 IMPLANT
NS IRRIG 1000ML POUR BTL (IV SOLUTION) ×3 IMPLANT
PACK LAMINECTOMY NEURO (CUSTOM PROCEDURE TRAY) ×3 IMPLANT
PAD ARMBOARD 7.5X6 YLW CONV (MISCELLANEOUS) ×9 IMPLANT
PIN DISTRACTION 14MM (PIN) ×6 IMPLANT
PLATE ANT CERV ATLANTIS 19 (Plate) ×3 IMPLANT
RUBBERBAND STERILE (MISCELLANEOUS) ×6 IMPLANT
SCREW SELF TAP VAR 4.0X13 (Screw) ×12 IMPLANT
SPACER BONE CORNERSTONE 5X14 (Orthopedic Implant) ×3 IMPLANT
SPONGE INTESTINAL PEANUT (DISPOSABLE) ×3 IMPLANT
SPONGE SURGIFOAM ABS GEL SZ50 (HEMOSTASIS) ×3 IMPLANT
STAPLER VISISTAT 35W (STAPLE) IMPLANT
SUT MNCRL AB 3-0 PS2 18 (SUTURE) ×3 IMPLANT
SUT VIC AB 3-0 SH 8-18 (SUTURE) ×6 IMPLANT
TAPE CLOTH 3X10 TAN LF (GAUZE/BANDAGES/DRESSINGS) IMPLANT
TOWEL GREEN STERILE (TOWEL DISPOSABLE) ×3 IMPLANT
TOWEL GREEN STERILE FF (TOWEL DISPOSABLE) ×3 IMPLANT
WATER STERILE IRR 1000ML POUR (IV SOLUTION) ×3 IMPLANT

## 2018-03-30 NOTE — Transfer of Care (Signed)
Immediate Anesthesia Transfer of Care Note  Patient: Sara Munoz  Procedure(s) Performed: Cervical six-seven Anterior cervical decompression/discectomy/fusion (N/A Neck)  Patient Location: PACU  Anesthesia Type:General  Level of Consciousness: awake, alert  and oriented  Airway & Oxygen Therapy: Patient Spontanous Breathing and Patient connected to nasal cannula oxygen  Post-op Assessment: Report given to RN and Post -op Vital signs reviewed and stable  Post vital signs: Reviewed and stable  Last Vitals:  Vitals Value Taken Time  BP    Temp    Pulse 87 03/30/2018  3:50 PM  Resp 18 03/30/2018  3:50 PM  SpO2 95 % 03/30/2018  3:50 PM  Vitals shown include unvalidated device data.  Last Pain:  Vitals:   03/30/18 0920  TempSrc:   PainSc: 9       Patients Stated Pain Goal: 4 (50/27/74 1287)  Complications: No apparent anesthesia complications

## 2018-03-30 NOTE — Progress Notes (Signed)
Neurosurgery Service Post-operative progress note  Assessment & Plan: 52 y.o. woman with R C7 radiculopathy s/p C6-7 ACDF, recovering well. Intact on exam except for numbness in the R C7 distribution, pt states the sensation is improved from preop, currently not having any radicular pain. -3C overnight for observation, discharge home in AM  [place]  03/30/18 5:05 PM

## 2018-03-30 NOTE — Anesthesia Procedure Notes (Signed)
Procedure Name: Intubation Performed by: Junie Bame, RN Pre-anesthesia Checklist: Patient identified, Emergency Drugs available, Suction available and Patient being monitored Patient Re-evaluated:Patient Re-evaluated prior to induction Oxygen Delivery Method: Circle System Utilized Preoxygenation: Pre-oxygenation with 100% oxygen Induction Type: IV induction Ventilation: Mask ventilation without difficulty Laryngoscope Size: Mac and 4 Grade View: Grade I Tube type: Oral Number of attempts: 1 Airway Equipment and Method: Stylet and Oral airway Placement Confirmation: ETT inserted through vocal cords under direct vision,  positive ETCO2 and breath sounds checked- equal and bilateral Secured at: 23 cm Tube secured with: Tape Dental Injury: Teeth and Oropharynx as per pre-operative assessment

## 2018-03-30 NOTE — Op Note (Signed)
PATIENT: Sara Munoz  DATE:03/30/18   PRE-OPERATIVE DIAGNOSIS:  Cervical radiculopathy   POST-OPERATIVE DIAGNOSIS:  Cervical radiculopathy   PROCEDURE:  C6-C7 Anterior Cervical Discectomy and Instrumented Fusion   SURGEON:  Surgeon(s) and Role:    Jaki Steptoe, Joyice Faster, MD - Primary    Consuella Lose, MD - Assisting   ANESTHESIA: ETGA   BRIEF HISTORY: This is a 52yo woman who presented with right upper extremity numbness and pain in the right C7 distribution. 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 This was discussed with the patient as well as risks, benefits, and alternatives and the patient wished to proceed with surgery.   OPERATIVE DETAIL: The patient was taken to the operating room and placed on the OR table in the supine position. A formal time out was performed with two patient identifiers and confirmed the operative site. Anesthesia was induced by the anesthesia team.  Fluoroscopy was used to localize the surgical level and an incision was marked in a skin crease. The area was then prepped and draped in a sterile fashion. A transverse linear incision was made on the right side of the neck. The platysma was divided medial to the sternocleidomastoid muscle. The carotid sheath was palpated, identified, and retracted laterally with the sternocleidomastoid muscle. The strap muscles were identified and retracted medially and the pretracheal fascia was entered. A bent spinal needle was used with fluoroscopy to localize the surgical level after dissection. The longus colli were elevated bilaterally and a C6-7 discectomy was performed with bilateral foraminotomies. There was significant calcified ligament and disc that were adherent to the spinal cord and required significant effort to elevate and remove. After thorough decompression of the bilateral foramina was obtained without any palpable evidence of residual  stenosis, a 40mm cortical allograft (Medtronic) was inserted into the disc space as an interbody graft. An anterior plate (Medtronic) was positioned and 4, 69mm screws were used to secure the plate to the C6 and C7 vertebral bodies. Hemostasis was obtained and the incision was closed in layers. All instrument and sponge counts were correct. The patient was then returned to anesthesia for emergence. No apparent complications at the completion of the procedure.   EBL:  170mL   DRAINS: none   SPECIMENS: none   Judith Part, MD  03/30/18 3:32 PM

## 2018-03-30 NOTE — Anesthesia Preprocedure Evaluation (Signed)
Anesthesia Evaluation  Patient identified by MRN, date of birth, ID band Patient awake    Reviewed: Allergy & Precautions, NPO status , Patient's Chart, lab work & pertinent test results  Airway Mallampati: I       Dental no notable dental hx. (+) Teeth Intact   Pulmonary asthma , sleep apnea , COPD,  COPD inhaler, Current Smoker,    Pulmonary exam normal breath sounds clear to auscultation       Cardiovascular hypertension, Pt. on medications and Pt. on home beta blockers + CAD and + Past MI  Normal cardiovascular exam Rhythm:Regular Rate:Normal  ECG 5 days ago is NSR normal ECG   Neuro/Psych    GI/Hepatic GERD  Medicated,  Endo/Other  negative endocrine ROS  Renal/GU      Musculoskeletal   Abdominal Normal abdominal exam  (+)   Peds  Hematology   Anesthesia Other Findings   Reproductive/Obstetrics negative OB ROS                             Anesthesia Physical Anesthesia Plan  ASA: II  Anesthesia Plan: General   Post-op Pain Management:    Induction: Intravenous  PONV Risk Score and Plan: 3 and Ondansetron, Dexamethasone, Midazolam and Promethazine  Airway Management Planned: Oral ETT  Additional Equipment:   Intra-op Plan:   Post-operative Plan: Extubation in OR  Informed Consent: I have reviewed the patients History and Physical, chart, labs and discussed the procedure including the risks, benefits and alternatives for the proposed anesthesia with the patient or authorized representative who has indicated his/her understanding and acceptance.   Dental advisory given  Plan Discussed with: CRNA and Surgeon  Anesthesia Plan Comments:         Anesthesia Quick Evaluation

## 2018-03-30 NOTE — Chronic Care Management (AMB) (Signed)
  Chronic Care Management   Follow Up Note   03/30/2018 Name: Sara Munoz MRN: [id number] DOB: 06-20-1966  Referred by: [name], [name] Reason for referral : Care Coordination (care coordination re: surgery)  Assessment: Sara Munoz is a 52 year old primary care patient of Creig Hines [name] who was referred to the Care Management team for assistance with care coordination and disease management and medication management/assessment in anticipation of neck surgery.    Goals Addressed    . "I want to get my neck and back pain under control" (pt-stated)       I spoke with patient by phone on Friday, October 4. She had been to the neurosurgery office and notified me that her surgery is scheduled for today. She was somewhat concerned about a ride to the hospital ([facility]) but said that her brother volunteered to take her. I see in the medical record that Sara Munoz arrived for surgery as scheduled today.   Clinical Goals: Over the nextt 7 days, patient will work with hospital and embedded cm team   Interventions: discussed neurosurgery plans with patient and offered additional support/collaboration with neurosurgery office if she has questions or needs assistance   Plan: I will monitor Sara Munoz hospital progress and will reach out to her this week.   [facility] Care Management [phone]

## 2018-03-30 NOTE — Progress Notes (Signed)
Pharmacy Antibiotic Note  Sara Munoz is a 52 y.o. female admitted on 03/30/2018 with with R C7 radiculopathy.  Now s/p C6-7 ACDF done 03/30/18.  Pharmacy has been consulted for Vancomycin dosing for surgical prophylaxis.  Vancomycin preop dose 1000 mg IV given at 0913 AM today.   No drains placed, thus will give vancomycin dose x 1 dose 12 hours post op.   Plan: Vancomycin 1000 mg IV x1 at 22:00 tonight. Pharmacy will sign off.   Height: 5\' 4"  (162.6 cm) Weight: 179 lb 14.3 oz (81.6 kg) IBW/kg (Calculated) : 54.7  Temp (24hrs), Avg:98.3 F (36.8 C), Min:97.7 F (36.5 C), Max:99.1 F (37.3 C)  Recent Labs  Lab 03/25/18 1946  WBC 7.8  CREATININE 0.69    Estimated Creatinine Clearance: 87 mL/min (by C-G formula based on SCr of 0.69 mg/dL).    Allergies  Allergen Reactions  . Levofloxacin Hives and Swelling  . Flexeril [Cyclobenzaprine] Hives, Swelling and Other (See Comments)    Reaction:  Facial/lip swelling  Reaction:  Facial/lip swelling   . Keflex [Cephalexin] Hives  . Ketorolac   . Phenergan [Promethazine Hcl] Other (See Comments)    Agitation.   . Toradol [Ketorolac Tromethamine] Swelling and Other (See Comments)    Reaction:  Facial/tongue swelling  Reaction:  Facial/tongue swelling   . Tramadol Hives, Swelling and Other (See Comments)    Reaction:  Lip swelling  Reaction:  Lip swelling   . Zoloft [Sertraline Hcl] Swelling and Other (See Comments)    Reaction:  Tongue swelling  Reaction:  Tongue swelling    Thank you for allowing pharmacy to be a part of this patient's care.  Nicole Cella, RPh Clinical Pharmacist Please check AMION for all [place] phone numbers After 10:00 PM, call [place] [phone] 03/30/2018 5:20 PM

## 2018-03-30 NOTE — H&P (Signed)
Surgical H&P Update  HPI: 52 y.o. woman with arm and neck pain, here for surgical treatment. Her symptoms have consisted of right upper extremity pain and numbness and radiographic workup revealed a corresponding C6-7 disc herniation with right foraminal stenosis. No changes in health since she was last seen. Still having arm and neck pain and wishes to proceed with surgery.  PMHx:  Past Medical History:  Diagnosis Date  . Anxiety   . Asthma   . Atypical chest pain 08/02/2015  . CAD (coronary artery disease)   . Chronic back pain   . COPD (chronic obstructive pulmonary disease) ([place])   . Coronary artery disease    a. Mild-non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/2014  . GERD (gastroesophageal reflux disease)   . Hypercholesteremia   . Hypertension   . Hypokalemia   . Hypomagnesemia 01/04/2014  . Liver disease   . MI (myocardial infarction) ([place])   . Opiate use 02/27/2016  . Osteoarthritis   . Stroke [facility])    FamHx:  Family History  Problem Relation Age of Onset  . Heart disease Mother   . Lung cancer Mother   . Ovarian cancer Mother   . Healthy Brother   . Healthy Brother   . Healthy Brother   . Diabetes Maternal Uncle   . Breast cancer Maternal Aunt    SocHx:  reports that she has been smoking cigarettes. She has been smoking about 0.50 packs per day. She has never used smokeless tobacco. She reports that she has current or past drug history. Drug: Marijuana. She reports that she does not drink alcohol.  Physical Exam: AOx3, PERRL, FS, TM  Strength 5/5 x4, SILT except for right C7 numbness  Assesment/Plan: 52 y.o. woman with right C7 radiculopathy, here for C6-7 ACDF. Risks, benefits, and alternatives discussed and the patient would like to continue with surgery. -OR today -3C post-op  Judith Part, MD 03/30/18 12:17 PM

## 2018-03-30 NOTE — Brief Op Note (Signed)
03/30/2018  3:31 PM  PATIENT:  Sara Munoz  52 y.o. female  PRE-OPERATIVE DIAGNOSIS:  Cervical radiculopathy  POST-OPERATIVE DIAGNOSIS:  Cervical radiculopathy  PROCEDURE:  Procedure(s): Cervical six-seven Anterior cervical decompression/discectomy/fusion (N/A)  SURGEON:  Surgeon(s) and Role:    * Ostergard, Joyice Faster, MD - Primary    * Consuella Lose, MD - Assisting  ANESTHESIA:   general  EBL:  100cc   BLOOD ADMINISTERED:none  DRAINS: none   LOCAL MEDICATIONS USED:  LIDOCAINE   SPECIMEN:  No Specimen  DISPOSITION OF SPECIMEN:  N/A  COUNTS:  YES  TOURNIQUET:  * No tourniquets in log *  DICTATION: .Note written in EPIC  PLAN OF CARE: Admit for overnight observation  PATIENT DISPOSITION:  PACU - hemodynamically stable.   Delay start of Pharmacological VTE agent (>24hrs) due to surgical blood loss or risk of bleeding: yes

## 2018-03-30 NOTE — Plan of Care (Signed)
  Problem: Safety: Goal: Ability to remain free from injury will improve Outcome: Progressing   

## 2018-03-31 DIAGNOSIS — M50123 Cervical disc disorder at C6-C7 level with radiculopathy: Secondary | ICD-10-CM | POA: Diagnosis not present

## 2018-03-31 MED ORDER — OXYCODONE HCL 10 MG PO TABS: 10.0000 mg | ORAL_TABLET | ORAL | 0 refills | Status: AC | PRN

## 2018-03-31 NOTE — Progress Notes (Signed)
Neurosurgery Service Progress Note  Subjective: No acute events overnight, neck pain, preop radicular pain and numbness resolved   Objective: Vitals:   03/30/18 2022 03/30/18 2254 03/31/18 0404 03/31/18 0742  BP: 101/62 125/74 (!) 129/95 (!) 153/88  Pulse: 69 72 80 68  Resp: 18 18 18 18   Temp: 97.6 F (36.4 C)   97.7 F (36.5 C)  TempSrc: Oral   Oral  SpO2: 90% 94% 97% 97%  Weight:      Height:       Temp (24hrs), Avg:98.1 F (36.7 C), Min:97.6 F (36.4 C), Max:99.1 F (37.3 C)  CBC Latest Ref Rng & Units 03/25/2018 02/20/2018 02/20/2018  WBC 3.6 - 11.0 K/uL 7.8 11.5(H) 10.3  Hemoglobin 12.0 - 16.0 g/dL 13.4 13.3 13.8  Hematocrit 35.0 - 47.0 % 38.3 40.0 42.2  Platelets 150 - 440 K/uL 189 157 181   BMP Latest Ref Rng & Units 03/25/2018 02/20/2018 02/20/2018  Glucose 70 - 99 mg/dL 92 109(H) 73  BUN 6 - 20 mg/dL 8 10 8   Creatinine 0.44 - 1.00 mg/dL 0.69 0.85 0.81  BUN/Creat Ratio 9 - 23 - - 10  Sodium 135 - 145 mmol/L 133(L) 135 141  Potassium 3.5 - 5.1 mmol/L 3.7 3.6 4.1  Chloride 98 - 111 mmol/L 100 105 101  CO2 22 - 32 mmol/L 24 22 21   Calcium 8.9 - 10.3 mg/dL 8.8(L) 8.9 9.4    Intake/Output Summary (Last 24 hours) at 03/31/2018 0817 Last data filed at 03/30/2018 1840 Gross per 24 hour  Intake 1580.4 ml  Output 500 ml  Net 1080.4 ml    Current Facility-Administered Medications:  .  0.9 %  sodium chloride infusion, , Intravenous, Continuous, Bethany Hirt, Joyice Faster, MD, Last Rate: 75 mL/hr at 03/30/18 1840 .  acetaminophen (TYLENOL) tablet 650 mg, 650 mg, Oral, Q4H PRN **OR** acetaminophen (TYLENOL) suppository 650 mg, 650 mg, Rectal, Q4H PRN, Judith Part, MD .  acetaminophen (TYLENOL) tablet 1,000 mg, 1,000 mg, Oral, Q6H, Hermes Wafer, Joyice Faster, MD, 1,000 mg at 03/31/18 0514 .  albuterol (PROVENTIL) (2.5 MG/3ML) 0.083% nebulizer solution 3 mL, 3 mL, Inhalation, Q6H PRN, Judith Part, MD .  docusate sodium (COLACE) capsule 200 mg, 200 mg, Oral, BID PRN,  Judith Part, MD .  DULoxetine (CYMBALTA) DR capsule 60 mg, 60 mg, Oral, Daily, Judith Part, MD, 60 mg at 03/30/18 1829 .  gabapentin (NEURONTIN) tablet 600 mg, 600 mg, Oral, TID, Judith Part, MD, 600 mg at 03/30/18 2006 .  HYDROmorphone (DILAUDID) injection 0.5 mg, 0.5 mg, Intravenous, Q2H PRN, Judith Part, MD, 0.5 mg at 03/31/18 0519 .  lisinopril (PRINIVIL,ZESTRIL) tablet 40 mg, 40 mg, Oral, Daily, Judith Part, MD, 40 mg at 03/30/18 1829 .  menthol-cetylpyridinium (CEPACOL) lozenge 3 mg, 1 lozenge, Oral, PRN **OR** phenol (CHLORASEPTIC) mouth spray 1 spray, 1 spray, Mouth/Throat, PRN, Logyn Dedominicis A, MD .  metoprolol tartrate (LOPRESSOR) tablet 25 mg, 25 mg, Oral, BID, Judith Part, MD, 25 mg at 03/31/18 0751 .  mometasone-formoterol (DULERA) 200-5 MCG/ACT inhaler 2 puff, 2 puff, Inhalation, BID, Judith Part, MD, 2 puff at 03/30/18 2007 .  nicotine (NICODERM CQ - dosed in mg/24 hours) patch 21 mg, 21 mg, Transdermal, q1800, Judith Part, MD, 21 mg at 03/30/18 1828 .  nitroGLYCERIN (NITROSTAT) SL tablet 0.4 mg, 0.4 mg, Sublingual, Q5 Min x 3 PRN, June Vacha A, MD .  ondansetron (ZOFRAN) tablet 4 mg, 4 mg, Oral, Q6H PRN **OR** ondansetron (  ZOFRAN) injection 4 mg, 4 mg, Intravenous, Q6H PRN, Emonte Dieujuste A, MD .  oxyCODONE (Oxy IR/ROXICODONE) immediate release tablet 10 mg, 10 mg, Oral, Q3H PRN, Judith Part, MD, 10 mg at 03/31/18 0751 .  oxyCODONE (Oxy IR/ROXICODONE) immediate release tablet 5 mg, 5 mg, Oral, Q3H PRN, Judith Part, MD .  pantoprazole (PROTONIX) EC tablet 40 mg, 40 mg, Oral, Daily, Thimothy Barretta A, MD .  sodium chloride flush (NS) 0.9 % injection 3 mL, 3 mL, Intravenous, Q12H, Hedaya Latendresse A, MD .  sodium chloride flush (NS) 0.9 % injection 3 mL, 3 mL, Intravenous, PRN, Jarid Sasso A, MD .  sucralfate (CARAFATE) 1 GM/10ML suspension 1 g, 1 g, Oral, TID PC & HS, Judith Part, MD, 1 g at 03/30/18 2125 .  tiotropium (SPIRIVA) inhalation capsule ([facility] use ONLY) 18 mcg, 1 capsule, Inhalation, Daily, Jaidy Cottam A, MD .  tiZANidine (ZANAFLEX) tablet 4 mg, 4 mg, Oral, Daily, Ravi Tuccillo, Joyice Faster, MD, 4 mg at 03/30/18 1829   Physical Exam: AOx3, PERRL, EOMI, FS, TM, Strength 5/5 x4, SILTx4 Incision c/d/i  Assessment & Plan: 52 y.o. woman s/p C6-7 ACDF, recovering well, preoperative symptoms resolved. -discharge home this morning  Judith Part  03/31/18 8:17 AM

## 2018-03-31 NOTE — Discharge Summary (Signed)
Discharge Summary  Date of Admission: 03/30/2018  Date of Discharge: 03/31/18  Attending Physician: Emelda Brothers, MD  Hospital Course: Patient was admitted following an uncomplicated W8-5 ACDF. She was recovered in PACU and transferred to the spine unit for observation overnight. Her hospital course was uncomplicated and the patient was discharged home on POD1. She will follow up in clinic with me in 2 weeks.  Neurologic exam at discharge:  AOx3, PERRL, EOMI, FS, TM Strength 5/5 x4, SILTx4, no drift  Judith Part, MD 03/31/18 8:20 AM

## 2018-03-31 NOTE — Discharge Instructions (Signed)
Discharge Instructions  No restriction in activities, slowly increase your activity back to normal.   Your incision is closed with dermabond (purple glue). This will naturally fall off over the next 1-2 weeks.   Okay to shower on the day of discharge. Be gentle when cleaning your incision. Use regular soap and water. If that is uncomfortable, try using baby shampoo. Do not submerge the wound under water for 2 weeks after surgery.

## 2018-03-31 NOTE — Progress Notes (Signed)
OT Cancellation Note  Patient Details Name: Sara Munoz MRN: [id number] DOB: [birth year]   Cancelled Treatment:    Reason Eval/Treat Not Completed: OT screened, no needs identified, will sign off.  Pt has discharged.  Did quickly catch her before leaving and reiterated cervical precautions with her.  She stated "I'm good".  Lucille Passy, OTR/L [place] Pager [phone] Office [phone]   Lucille Passy M 03/31/2018, 9:16 AM

## 2018-03-31 NOTE — Evaluation (Signed)
Physical Therapy Evaluation and Discharge Patient Details Name: Sara Munoz MRN: [id number] DOB: Feb 07, 1966 Today's Date: 03/31/2018   History of Present Illness  Pt is a 52 y/o female who presents s/p C6-C7 ACDF on 03/30/18. PMH significant for CVA, OA, opiate use, MI, HTN, CAD, COPD, ORIF tib/fib fractures, ORIF femur fx.  Clinical Impression  Patient evaluated by Physical Therapy with no further acute PT needs identified. All education has been completed and the patient has no further questions. At the time of PT eval pt was able to perform transfers and ambulation with gross modified independence. Min guard provided for stair training and brother present for education as he will be taking her home today. Pt and brother educated on car transfer, precautions, positioning recommendations, and safe activity progression. See below for any follow-up Physical Therapy or equipment needs. PT is signing off. Thank you for this referral.      Follow Up Recommendations No PT follow up;Supervision for mobility/OOB    Equipment Recommendations  None recommended by PT    Recommendations for Other Services       Precautions / Restrictions Precautions Precautions: Fall;Cervical Precaution Booklet Issued: Yes (comment) Precaution Comments: Reviewed handout in detail with pt and brother. Pt was cued for maintenance of precautions during functional mobility.  Required Braces or Orthoses: ("No brace needed" order) Restrictions Weight Bearing Restrictions: No      Mobility  Bed Mobility               General bed mobility comments: Pt sitting up on EOB when PT arrived.   Transfers Overall transfer level: Modified independent Equipment used: None Transfers: Sit to/from Stand           General transfer comment: No assist required. Pt guarded and required increased time however maintained good upright posture and no unsteadiness noted.   Ambulation/Gait Ambulation/Gait assistance:  Modified independent (Device/Increase time) Gait Distance (Feet): 200 Feet Assistive device: None Gait Pattern/deviations: Step-through pattern;Decreased stride length Gait velocity: Decreased Gait velocity interpretation: 1.31 - 2.62 ft/sec, indicative of limited community ambulator General Gait Details: Slow and guarded, holding neck with hands at times due to pain. Overall no unsteadiness noted, and pt appeared comfortable ambulating.   Stairs Stairs: Yes Stairs assistance: Min guard Stair Management: No rails;Alternating pattern;Forwards Number of Stairs: 10 General stair comments: Brother present for education and he was instructed in proper guarding technique. Pt was able to complete a full flight of stairs without difficulty.   Wheelchair Mobility    Modified Rankin (Stroke Patients Only)       Balance Overall balance assessment: Needs assistance Sitting-balance support: Feet supported;No upper extremity supported Sitting balance-Leahy Scale: Good     Standing balance support: No upper extremity supported;During functional activity Standing balance-Leahy Scale: Fair                               Pertinent Vitals/Pain Pain Assessment: Faces Faces Pain Scale: Hurts even more Pain Location: Incision site Pain Descriptors / Indicators: Operative site guarding;Discomfort;Grimacing Pain Intervention(s): Limited activity within patient's tolerance;Monitored during session;Repositioned    Home Living Family/patient expects to be discharged to:: Private residence Living Arrangements: Spouse/significant other Available Help at Discharge: Family;Available 24 hours/day(brother) Type of Home: House Home Access: Stairs to enter Entrance Stairs-Rails: None Entrance Stairs-Number of Steps: 3 Home Layout: One level Home Equipment: Hand held shower head      Prior Function Level of Independence: Independent  Hand Dominance         Extremity/Trunk Assessment   Upper Extremity Assessment Upper Extremity Assessment: Defer to OT evaluation    Lower Extremity Assessment Lower Extremity Assessment: Overall WFL for tasks assessed    Cervical / Trunk Assessment Cervical / Trunk Assessment: Other exceptions Cervical / Trunk Exceptions: s/p ACDF  Communication   Communication: No difficulties  Cognition Arousal/Alertness: Awake/alert Behavior During Therapy: Restless Overall Cognitive Status: Within Functional Limits for tasks assessed                                        General Comments      Exercises     Assessment/Plan    PT Assessment Patent does not need any further PT services  PT Problem List         PT Treatment Interventions      PT Goals (Current goals can be found in the Care Plan section)  Acute Rehab PT Goals Patient Stated Goal: Discharge by 9:00 am so she can have a cigarette PT Goal Formulation: All assessment and education complete, DC therapy    Frequency     Barriers to discharge        Co-evaluation               AM-PAC PT "6 Clicks" Daily Activity  Outcome Measure Difficulty turning over in bed (including adjusting bedclothes, sheets and blankets)?: None Difficulty moving from lying on back to sitting on the side of the bed? : None Difficulty sitting down on and standing up from a chair with arms (e.g., wheelchair, bedside commode, etc,.)?: None Help needed moving to and from a bed to chair (including a wheelchair)?: None Help needed walking in hospital room?: None Help needed climbing 3-5 steps with a railing? : A Little 6 Click Score: 23    End of Session Equipment Utilized During Treatment: Gait belt Activity Tolerance: Patient tolerated treatment well Patient left: with call bell/phone within reach;with family/visitor present(Sitting EOB) Nurse Communication: Mobility status PT Visit Diagnosis: Pain;Difficulty in walking, not elsewhere  classified (R26.2) Pain - part of body: (neck)    Time: 7893-8101 PT Time Calculation (min) (ACUTE ONLY): 13 min   Charges:   PT Evaluation $PT Eval Moderate Complexity: 1 Mod          Rolinda Roan, PT, DPT Acute Rehabilitation Services Pager: [phone] Office: [phone]   Thelma Comp 03/31/2018, 9:42 AM

## 2018-03-31 NOTE — Progress Notes (Signed)
Patient is discharged from room 3C11 at this time. Alert and in stable condition. IV site d/c'd and instructions read to patient and brother with understanding verbalized. Left unit via wheelchair with all belongings at side.

## 2018-04-01 ENCOUNTER — Other Ambulatory Visit: Payer: Self-pay

## 2018-04-01 ENCOUNTER — Emergency Department
Admission: EM | Admit: 2018-04-01 | Discharge: 2018-04-01 | Disposition: A | Discharge status: Home / Self Care | Payer: Medicaid Other | Attending: Emergency Medicine | Admitting: Emergency Medicine

## 2018-04-01 ENCOUNTER — Emergency Department: Payer: Medicaid Other

## 2018-04-01 DIAGNOSIS — I252 Old myocardial infarction: Secondary | ICD-10-CM | POA: Insufficient documentation

## 2018-04-01 DIAGNOSIS — M542 Cervicalgia: Secondary | ICD-10-CM | POA: Insufficient documentation

## 2018-04-01 DIAGNOSIS — R200 Anesthesia of skin: Secondary | ICD-10-CM | POA: Diagnosis not present

## 2018-04-01 DIAGNOSIS — J449 Chronic obstructive pulmonary disease, unspecified: Secondary | ICD-10-CM | POA: Insufficient documentation

## 2018-04-01 DIAGNOSIS — Z79899 Other long term (current) drug therapy: Secondary | ICD-10-CM | POA: Diagnosis not present

## 2018-04-01 DIAGNOSIS — G8918 Other acute postprocedural pain: Secondary | ICD-10-CM | POA: Diagnosis not present

## 2018-04-01 DIAGNOSIS — F17210 Nicotine dependence, cigarettes, uncomplicated: Secondary | ICD-10-CM | POA: Diagnosis not present

## 2018-04-01 DIAGNOSIS — I2510 Atherosclerotic heart disease of native coronary artery without angina pectoris: Secondary | ICD-10-CM | POA: Diagnosis not present

## 2018-04-01 DIAGNOSIS — I10 Essential (primary) hypertension: Secondary | ICD-10-CM | POA: Diagnosis not present

## 2018-04-01 DIAGNOSIS — F1210 Cannabis abuse, uncomplicated: Secondary | ICD-10-CM | POA: Diagnosis not present

## 2018-04-01 LAB — CBC WITH DIFFERENTIAL/PLATELET
Abs Immature Granulocytes: 0.05 10*3/uL (ref 0.00–0.07)
Basophils Absolute: 0 10*3/uL (ref 0.0–0.1)
Basophils Relative: 0 %
Eosinophils Absolute: 0.1 10*3/uL (ref 0.0–0.5)
Eosinophils Relative: 1 %
HCT: 38.5 % (ref 36.0–46.0)
Hemoglobin: 12.5 g/dL (ref 12.0–15.0)
Immature Granulocytes: 1 %
Lymphocytes Relative: 24 %
Lymphs Abs: 2.4 10*3/uL (ref 0.7–4.0)
MCH: 28.7 pg (ref 26.0–34.0)
MCHC: 32.5 g/dL (ref 30.0–36.0)
MCV: 88.5 fL (ref 80.0–100.0)
Monocytes Absolute: 0.8 10*3/uL (ref 0.1–1.0)
Monocytes Relative: 8 %
Neutro Abs: 6.6 10*3/uL (ref 1.7–7.7)
Neutrophils Relative %: 66 %
Platelets: 202 10*3/uL (ref 150–400)
RBC: 4.35 MIL/uL (ref 3.87–5.11)
RDW: 16.4 % — ABNORMAL HIGH (ref 11.5–15.5)
WBC: 9.9 10*3/uL (ref 4.0–10.5)
nRBC: 0 % (ref 0.0–0.2)

## 2018-04-01 LAB — BASIC METABOLIC PANEL
Anion gap: 11 (ref 5–15)
BUN: 11 mg/dL (ref 6–20)
CO2: 26 mmol/L (ref 22–32)
Calcium: 9.1 mg/dL (ref 8.9–10.3)
Chloride: 103 mmol/L (ref 98–111)
Creatinine, Ser: 0.91 mg/dL (ref 0.44–1.00)
GFR calc Af Amer: >60 mL/min (ref >60)
GFR calc non Af Amer: >60 mL/min (ref >60)
Glucose, Bld: 88 mg/dL (ref 70–99)
Potassium: 3.3 mmol/L — ABNORMAL LOW (ref 3.5–5.1)
Sodium: 140 mmol/L (ref 135–145)

## 2018-04-01 MED ORDER — FENTANYL CITRATE (PF) 100 MCG/2ML IJ SOLN
50.0000 ug | Freq: Once | INTRAMUSCULAR | Status: AC
  Administered 2018-04-01: 50 ug via INTRAVENOUS
  Filled 2018-04-01: qty 2

## 2018-04-01 MED ORDER — IOHEXOL 300 MG/ML  SOLN
75.0000 mL | Freq: Once | INTRAMUSCULAR | Status: AC | PRN
  Administered 2018-04-01: 75 mL via INTRAVENOUS

## 2018-04-01 NOTE — ED Notes (Signed)
Pt was advised not to drive or operate heavy machinery since she received narcotics. Pt states that she understands.

## 2018-04-01 NOTE — Discharge Instructions (Addendum)
Please seek medical attention for any high fevers, chest pain, shortness of breath, change in behavior, persistent vomiting, bloody stool or any other new or concerning symptoms.  

## 2018-04-01 NOTE — ED Notes (Signed)
Patient transported to CT 

## 2018-04-01 NOTE — ED Triage Notes (Signed)
Pt. Presents to the ED via EMS from home c/o neck pain.  Patient had a ACDF on Monday by Dr. Zada Finders at [facility].  Pt. Complains of posterior head and neck pain with numbness. Pt. Has chronic neck and back pain as well as migraines.  Pt. Presents alert and oriented.  Incision site appears intact with glue. No obvious swelling or redness noted.

## 2018-04-01 NOTE — ED Notes (Signed)
Pt. Returned from CT.

## 2018-04-01 NOTE — ED Provider Notes (Signed)
[facility] Emergency Department Provider Note  ____________________________________________   I have reviewed the triage vital signs and the nursing notes.   HISTORY  Chief Complaint Neck Pain   History limited by: Not Limited   HPI Sara Munoz is a 52 y.o. female who presents to the emergency department today because of concerns for neck pain and head numbness.  The patient had anterior cervical fusion surgery 2 days ago.  She states that she now feels like her entire head is numb.  This is bilateral.  She is also having severe pain in her neck.  She feels like she can barely hold her neck up.  The patient states that the pain medications prescribed to her have not been effective.   Per medical record review patient has a history of chronic neck pain, recent surgery.  Past Medical History:  Diagnosis Date  . Anxiety   . Asthma   . Atypical chest pain 08/02/2015  . CAD (coronary artery disease)   . Chronic back pain   . COPD (chronic obstructive pulmonary disease) ([place])   . Coronary artery disease    a. Mild-non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/2014  . GERD (gastroesophageal reflux disease)   . Hypercholesteremia   . Hypertension   . Hypokalemia   . Hypomagnesemia 01/04/2014  . Liver disease   . MI (myocardial infarction) ([place])   . Opiate use 02/27/2016  . Osteoarthritis   . Stroke [facility])     Patient Active Problem List   Diagnosis Date Noted  . Cervical radiculopathy 02/12/2018  . Chronic migraine 02/12/2018  . Paresthesia 08/29/2017  . Neck pain 08/29/2017  . Daily headache 08/29/2017  . Chronic active hepatitis ([place]) 07/29/2017  . Neurogenic pain 07/31/2016  . Chronic pain syndrome 06/05/2016  . Carotid artery stenosis 02/27/2016  . Chronic constipation 02/27/2016  . Chronic nausea 02/27/2016  . Hypercholesterolemia 02/27/2016  . Osteoarthritis 02/27/2016  . PTSD (post-traumatic stress disorder) 02/27/2016  . TIA (transient ischemic  attack) 02/27/2016  . Long term current use of opiate analgesic 02/27/2016  . Long term prescription opiate use 02/27/2016  . Encounter for pain management consult 02/27/2016  . Chronic hip pain (Location of Tertiary source of pain) (Bilateral) (L>R) 02/27/2016  . Chronic knee pain (Bilateral) (L>R) 02/27/2016  . Chronic shoulder pain (Bilateral) (L>R) 02/27/2016  . Chronic sacroiliac joint pain (Bilateral) (L>R) 02/27/2016  . Chronic low back pain (Location of Primary Source of Pain) (Bilateral) (L>R) 02/27/2016  . Chronic lower extremity pain (Location of Secondary source of pain) (Bilateral) (L>R) 02/27/2016  . Osteoarthritis of hip (Bilateral) (L>R) 02/27/2016  . Chronic neck pain (posterior midline) (Bilateral) (L>R) 02/27/2016  . Cervicogenic headache (Bilateral) (L>R) 02/27/2016  . Occipital headache (Bilateral) (L>R) 02/27/2016  . Chronic upper extremity pain (Bilateral) (L>R) 02/27/2016  . Chronic Cervical radicular pain (Bilateral) (L>R) 02/27/2016  . Chronic lumbar radicular pain (Right) (L5 dermatome) 02/27/2016  . Lumbar facet syndrome (Bilateral) (L>R) 02/27/2016  . Long term prescription benzodiazepine use 02/27/2016  . Hypertension   . COPD (chronic obstructive pulmonary disease) ([place]) 10/09/2015  . GERD (gastroesophageal reflux disease) 10/09/2015  . Non-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/2014 08/02/2015  . Hyperlipidemia 08/02/2015  . Costochondritis 08/02/2015  . 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 GAD (generalized anxiety disorder) 09/03/2014  . Narcotic dependence ([place]) 09/03/2014  . PVD (peripheral vascular disease) ([place]) 09/03/2014  . Smoker 09/03/2014  . Cigarette nicotine dependence with nicotine-induced disorder 07/08/2014  . Anemia of chronic disease  03/19/2014  . Narcotic abuse, continuous ([place]) 03/19/2014  . Polysubstance abuse ([place]) 03/19/2014  . MSSA (methicillin susceptible Staphylococcus aureus) septicemia ([place]) 03/07/2014  . Hypomagnesemia 01/04/2014  .  Chronic cough 09/04/2011  . Sleep apnea, obstructive 09/04/2011    Past Surgical History:  Procedure Laterality Date  . CARDIAC CATHETERIZATION Left 02/22/2016   Procedure: Left Heart Cath and Coronary Angiography;  Surgeon: Yolonda Kida, MD;  Location: [facility];  Service: Cardiovascular;  Laterality: Left;  . COLONOSCOPY WITH PROPOFOL N/A 09/19/2016   Procedure: COLONOSCOPY WITH PROPOFOL;  Surgeon: [name], MD;  Location: [facility] ENDOSCOPY;  Service: Endoscopy;  Laterality: N/A;  . ESOPHAGOGASTRODUODENOSCOPY (EGD) WITH PROPOFOL N/A 09/18/2017   Procedure: ESOPHAGOGASTRODUODENOSCOPY (EGD) WITH PROPOFOL;  Surgeon: [name], MD;  Location: [facility] ENDOSCOPY;  Service: Gastroenterology;  Laterality: N/A;  . hemorroids    . NASAL SINUS SURGERY    . ORIF FEMUR FRACTURE    . ORIF TIBIA & FIBULA FRACTURES    . OVARY SURGERY      Prior to Admission medications   Medication Sig Start Date End Date Taking? Authorizing Provider  albuterol (PROVENTIL HFA;VENTOLIN HFA) 108 (90 Base) MCG/ACT inhaler Inhale 2 puffs into the lungs every 6 (six) hours as needed for wheezing or shortness of breath. 05/26/17   [name], [name]  budesonide-formoterol (SYMBICORT) 160-4.5 MCG/ACT inhaler Inhale 2 puffs into the lungs 2 (two) times daily. 05/26/17   [name], [name]  docusate sodium (COLACE) 100 MG capsule Take 200 mg by mouth 2 (two) times daily as needed for mild constipation.    [provider]  DULoxetine (CYMBALTA) 60 MG capsule Take 1 capsule (60 mg total) by mouth daily. 02/12/18   [name], MD  gabapentin (NEURONTIN) 600 MG tablet Take 1 tablet (600 mg total) by mouth 3 (three) times daily. 06/26/17 03/27/18  [name], [name]  lisinopril (PRINIVIL,ZESTRIL) 40 MG tablet Take 1 tablet (40 mg total) by mouth daily. 08/05/17   [name], [name]  metoprolol tartrate (LOPRESSOR) 25 MG tablet TAKE 1 TABLET BY MOUTH TWICE A DAY Patient taking differently: Take 25 mg  by mouth 2 (two) times daily.  01/21/18   [name], [name]  Naloxone HCl (NARCAN IJ) Inject as directed as needed.    [provider]  nicotine (NICODERM CQ - DOSED IN MG/24 HOURS) 21 mg/24hr patch Place 1 patch (21 mg total) onto the skin daily. 02/20/18 04/03/18  [name], [name]  nitroGLYCERIN (NITROSTAT) 0.4 MG SL tablet Place 1 tablet (0.4 mg total) under the tongue every 5 (five) minutes x 3 doses as needed for chest pain. 08/03/15   Demetrios Loll, MD  omeprazole (PRILOSEC) 40 MG capsule Take 1 capsule (40 mg total) daily by mouth. 05/07/17   [name], MD  ondansetron (ZOFRAN-ODT) 8 MG disintegrating tablet Take 1 tablet (8 mg total) by mouth 2 (two) times daily. 02/12/18   [name], MD  oxyCODONE 10 MG TABS Take 1 tablet (10 mg total) by mouth every 4 (four) hours as needed for up to 7 days (pain). 03/31/18 04/07/18  Judith Part, MD  rizatriptan (MAXALT-MLT) 10 MG disintegrating tablet Take 1 tablet (10 mg total) by mouth as needed. May repeat in 2 hours if needed 02/12/18   [name], MD  sucralfate (CARAFATE) 1 GM/10ML suspension Take 10 mLs (1 g total) by mouth 4 (four) times daily. 02/25/18   [name], MD  Tiotropium Bromide Monohydrate (SPIRIVA RESPIMAT) 2.5  MCG/ACT AERS Inhale 1 puff into the lungs daily. 05/26/17   [name], [name]  tiZANidine (ZANAFLEX) 4 MG capsule Take 4 mg by mouth daily.     [provider]    Allergies Levofloxacin; Flexeril [cyclobenzaprine]; Keflex [cephalexin]; Ketorolac; Phenergan [promethazine hcl]; Toradol [ketorolac tromethamine]; Tramadol; and Zoloft [sertraline hcl]  Family History  Problem Relation Age of Onset  . Heart disease Mother   . Lung cancer Mother   . Ovarian cancer Mother   . Healthy Brother   . Healthy Brother   . Healthy Brother   . Diabetes Maternal Uncle   . Breast cancer Maternal Aunt     Social History Social History   Tobacco Use  . Smoking status: Current Every Day Smoker     Packs/day: 0.50    Types: Cigarettes  . Smokeless tobacco: Never Used  Substance Use Topics  . Alcohol use: No    Alcohol/week: 0.0 standard drinks  . Drug use: Yes    Types: Marijuana    Comment: 2 months ago     Review of Systems Constitutional: No fever/chills Eyes: No visual changes. ENT: Positive for neck pain. Cardiovascular: Denies chest pain. Respiratory: Denies shortness of breath. Gastrointestinal: No abdominal pain.  No nausea, no vomiting.  No diarrhea.   Genitourinary: Negative for dysuria. Musculoskeletal: Negative for back pain. Skin: Negative for rash. Neurological: Positive for head numbness. ____________________________________________   PHYSICAL EXAM:  VITAL SIGNS: ED Triage Vitals  Enc Vitals Group     BP 04/01/18 1936 110/63     Pulse Rate 04/01/18 1936 84     Resp --      Temp 04/01/18 1936 98.4 F (36.9 C)     Temp Source 04/01/18 1936 Oral     SpO2 04/01/18 1936 97 %     Weight 04/01/18 1936 180 lb (81.6 kg)     Height 04/01/18 1936 5\' 4"  (1.626 m)     Head Circumference --      Peak Flow --      Pain Score 04/01/18 1948 10    Constitutional: Alert and oriented.  Eyes: Conjunctivae are normal.  ENT      Head: Normocephalic and atraumatic.      Nose: No congestion/rhinnorhea.      Mouth/Throat: Mucous membranes are moist.      Neck: Anterior incision with dermabond. Clean, dry intact.  Hematological/Lymphatic/Immunilogical: No cervical lymphadenopathy. Cardiovascular: Normal rate, regular rhythm.  No murmurs, rubs, or gallops.  Respiratory: Normal respiratory effort without tachypnea nor retractions. Breath sounds are clear and equal bilaterally. No wheezes/rales/rhonchi. Gastrointestinal: Soft and non tender. No rebound. No guarding.  Genitourinary: Deferred Musculoskeletal: Normal range of motion in all extremities. No lower extremity edema. Neurologic:  Normal speech and language. No gross focal neurologic deficits are appreciated.   Skin:  Skin is warm, dry and intact. No rash noted. Psychiatric: Mood and affect are normal. Speech and behavior are normal. Patient exhibits appropriate insight and judgment.  ____________________________________________    LABS (pertinent positives/negatives)  BMP k 3.3 otherwise wnl CBC wbc 9.9, hgb 12.5, plt 202  ____________________________________________   EKG  None  ____________________________________________    RADIOLOGY  CT neck Post operative changes. No abscess   ____________________________________________   PROCEDURES  Procedures  ____________________________________________   INITIAL IMPRESSION / ASSESSMENT AND PLAN / ED COURSE  Pertinent labs & imaging results that were available during my care of the patient were reviewed by me and considered in my medical decision making (see  chart for details).   Patient comes to the emergency department today because of concerns for neck pain after recent anterior fusion.  Patient had blood work and CT scan done.  No concerning leukocytosis on the blood work.  CT scan shows postoperative changes as well as some hyperdensity around C4 or C5.  Discussed with neurosurgery at [facility].  At this point they do not feel that is anything to be concerned about.  Will plan on discharging to follow-up with neurosurgery.   ____________________________________________   FINAL CLINICAL IMPRESSION(S) / ED DIAGNOSES  Final diagnoses:  Neck pain  Post-operative pain     Note: This dictation was prepared with Dragon dictation. Any transcriptional errors that result from this process are unintentional     Nance Pear, MD 04/01/18 2237

## 2018-04-02 ENCOUNTER — Ambulatory Visit: Payer: Self-pay | Admitting: Gastroenterology

## 2018-04-02 ENCOUNTER — Encounter (HOSPITAL_COMMUNITY): Payer: Self-pay | Admitting: Neurological Surgery

## 2018-04-06 ENCOUNTER — Telehealth: Payer: Self-pay

## 2018-04-06 NOTE — Anesthesia Postprocedure Evaluation (Signed)
Anesthesia Post Note  Patient: Sara Munoz  Procedure(s) Performed: Cervical six-seven Anterior cervical decompression/discectomy/fusion (N/A Neck)     Patient location during evaluation: PACU Anesthesia Type: General Level of consciousness: awake and sedated Pain management: pain level controlled Vital Signs Assessment: post-procedure vital signs reviewed and stable Respiratory status: spontaneous breathing Cardiovascular status: stable Postop Assessment: no apparent nausea or vomiting Anesthetic complications: no    Last Vitals:  Vitals:   03/31/18 0742 03/31/18 0839  BP: (!) 153/88   Pulse: 68 72  Resp: 18 18  Temp: 36.5 C   SpO2: 97% 96%    Last Pain:  Vitals:   03/31/18 0851  TempSrc:   PainSc: 5    Pain Goal: Patients Stated Pain Goal: 3 (03/31/18 0519)               Whitni Pasquini JR,JOHN Mateo Flow

## 2018-04-08 DIAGNOSIS — G894 Chronic pain syndrome: Secondary | ICD-10-CM | POA: Diagnosis not present

## 2018-04-08 DIAGNOSIS — M545 Low back pain: Secondary | ICD-10-CM | POA: Diagnosis not present

## 2018-04-08 DIAGNOSIS — M79661 Pain in right lower leg: Secondary | ICD-10-CM | POA: Diagnosis not present

## 2018-04-08 DIAGNOSIS — M542 Cervicalgia: Secondary | ICD-10-CM | POA: Diagnosis not present

## 2018-04-08 DIAGNOSIS — M79662 Pain in left lower leg: Secondary | ICD-10-CM | POA: Diagnosis not present

## 2018-04-16 DIAGNOSIS — M5412 Radiculopathy, cervical region: Secondary | ICD-10-CM | POA: Diagnosis not present

## 2018-05-09 ENCOUNTER — Encounter: Payer: Self-pay | Admitting: Emergency Medicine

## 2018-05-09 ENCOUNTER — Emergency Department: Payer: Medicaid Other

## 2018-05-09 ENCOUNTER — Emergency Department
Admission: EM | Admit: 2018-05-09 | Discharge: 2018-05-09 | Disposition: A | Discharge status: Home / Self Care | Payer: Medicaid Other | Attending: Emergency Medicine | Admitting: Emergency Medicine

## 2018-05-09 ENCOUNTER — Other Ambulatory Visit: Payer: Self-pay

## 2018-05-09 DIAGNOSIS — F17210 Nicotine dependence, cigarettes, uncomplicated: Secondary | ICD-10-CM | POA: Diagnosis not present

## 2018-05-09 DIAGNOSIS — M25522 Pain in left elbow: Secondary | ICD-10-CM | POA: Insufficient documentation

## 2018-05-09 DIAGNOSIS — F0781 Postconcussional syndrome: Secondary | ICD-10-CM | POA: Insufficient documentation

## 2018-05-09 DIAGNOSIS — G44309 Post-traumatic headache, unspecified, not intractable: Secondary | ICD-10-CM

## 2018-05-09 DIAGNOSIS — S5002XA Contusion of left elbow, initial encounter: Secondary | ICD-10-CM | POA: Diagnosis not present

## 2018-05-09 DIAGNOSIS — J45909 Unspecified asthma, uncomplicated: Secondary | ICD-10-CM | POA: Insufficient documentation

## 2018-05-09 DIAGNOSIS — I10 Essential (primary) hypertension: Secondary | ICD-10-CM | POA: Diagnosis not present

## 2018-05-09 DIAGNOSIS — Z79899 Other long term (current) drug therapy: Secondary | ICD-10-CM | POA: Insufficient documentation

## 2018-05-09 DIAGNOSIS — I2510 Atherosclerotic heart disease of native coronary artery without angina pectoris: Secondary | ICD-10-CM | POA: Insufficient documentation

## 2018-05-09 DIAGNOSIS — S069X9A Unspecified intracranial injury with loss of consciousness of unspecified duration, initial encounter: Secondary | ICD-10-CM | POA: Diagnosis not present

## 2018-05-09 DIAGNOSIS — R51 Headache: Secondary | ICD-10-CM | POA: Diagnosis not present

## 2018-05-09 DIAGNOSIS — S199XXA Unspecified injury of neck, initial encounter: Secondary | ICD-10-CM | POA: Diagnosis not present

## 2018-05-09 DIAGNOSIS — M542 Cervicalgia: Secondary | ICD-10-CM | POA: Diagnosis not present

## 2018-05-09 MED ORDER — DIPHENHYDRAMINE HCL 50 MG/ML IJ SOLN
25.0000 mg | Freq: Once | INTRAMUSCULAR | Status: DC
  Filled 2018-05-09: qty 1

## 2018-05-09 MED ORDER — METOCLOPRAMIDE HCL 5 MG/ML IJ SOLN
10.0000 mg | Freq: Once | INTRAMUSCULAR | Status: AC
  Administered 2018-05-09: 10 mg via INTRAVENOUS
  Filled 2018-05-09: qty 2

## 2018-05-09 MED ORDER — SODIUM CHLORIDE 0.9 % IV BOLUS: 500.0000 mL | Freq: Once | INTRAVENOUS | Status: DC

## 2018-05-09 MED ORDER — DIPHENHYDRAMINE HCL 25 MG PO CAPS
25.0000 mg | ORAL_CAPSULE | Freq: Once | ORAL | Status: AC
  Administered 2018-05-09: 25 mg via ORAL
  Filled 2018-05-09: qty 1

## 2018-05-09 MED ORDER — MORPHINE SULFATE (PF) 4 MG/ML IV SOLN
4.0000 mg | Freq: Once | INTRAVENOUS | Status: AC
  Administered 2018-05-09: 4 mg via INTRAVENOUS
  Filled 2018-05-09: qty 1

## 2018-05-09 NOTE — ED Triage Notes (Addendum)
Pt tripped and fell 2 weeks ago head first into brick wall. + LOC. Worsening headaches that has been constant for 4 days now.  Ambulatory with steady gait. Also c/o neck pain.  Neck surgery 1 month ago for fusion.  Unsure how long she was unconscious.  Pain to left elbow from fall.  Pain with flexion and extension. Ambulatory. VSS. Alert and oriented.

## 2018-05-09 NOTE — Discharge Instructions (Addendum)
Return to the ER for new, worsening, persistent severe headaches, weakness or numbness, vomiting, confusion, or any other new or worsening symptoms that concern you.

## 2018-05-09 NOTE — ED Provider Notes (Signed)
[facility] Emergency Department Provider Note ____________________________________________   First MD Initiated Contact with Patient 05/09/18 1213     (approximate)  I have reviewed the triage vital signs and the nursing notes.   HISTORY  Chief Complaint Fall    HPI Sara Munoz is a 52 y.o. female with PMH as noted below and status post cervical discectomy/fusion last month who presents with headache, gradual onset, lasting for the last 4 days, and generalized in location.  She states it is associated with nausea as well as some photophobia.  She describes it as a migraine and states that she has had similar headaches previously.  The patient states that she had a fall from standing height 2 weeks ago and hit her head.  She did have brief LOC at that time.  She states that she started having the headaches around then intermittently, but it has now been constant over these last 4 days.  She also reports left elbow pain and some neck pain after the fall.  She denies any numbness or weakness.   Past Medical History:  Diagnosis Date  . Anxiety   . Asthma   . Atypical chest pain 08/02/2015  . CAD (coronary artery disease)   . Chronic back pain   . COPD (chronic obstructive pulmonary disease) ([place])   . Coronary artery disease    a. Mild-non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/2014  . GERD (gastroesophageal reflux disease)   . Hypercholesteremia   . Hypertension   . Hypokalemia   . Hypomagnesemia 01/04/2014  . Liver disease   . MI (myocardial infarction) ([place])   . Opiate use 02/27/2016  . Osteoarthritis   . Stroke [facility])     Patient Active Problem List   Diagnosis Date Noted  . Cervical radiculopathy 02/12/2018  . Chronic migraine 02/12/2018  . Paresthesia 08/29/2017  . Neck pain 08/29/2017  . Daily headache 08/29/2017  . Chronic active hepatitis ([place]) 07/29/2017  . Neurogenic pain 07/31/2016  . Chronic pain syndrome 06/05/2016  . Carotid artery stenosis  02/27/2016  . Chronic constipation 02/27/2016  . Chronic nausea 02/27/2016  . Hypercholesterolemia 02/27/2016  . Osteoarthritis 02/27/2016  . PTSD (post-traumatic stress disorder) 02/27/2016  . TIA (transient ischemic attack) 02/27/2016  . Long term current use of opiate analgesic 02/27/2016  . Long term prescription opiate use 02/27/2016  . Encounter for pain management consult 02/27/2016  . Chronic hip pain (Location of Tertiary source of pain) (Bilateral) (L>R) 02/27/2016  . Chronic knee pain (Bilateral) (L>R) 02/27/2016  . Chronic shoulder pain (Bilateral) (L>R) 02/27/2016  . Chronic sacroiliac joint pain (Bilateral) (L>R) 02/27/2016  . Chronic low back pain (Location of Primary Source of Pain) (Bilateral) (L>R) 02/27/2016  . Chronic lower extremity pain (Location of Secondary source of pain) (Bilateral) (L>R) 02/27/2016  . Osteoarthritis of hip (Bilateral) (L>R) 02/27/2016  . Chronic neck pain (posterior midline) (Bilateral) (L>R) 02/27/2016  . Cervicogenic headache (Bilateral) (L>R) 02/27/2016  . Occipital headache (Bilateral) (L>R) 02/27/2016  . Chronic upper extremity pain (Bilateral) (L>R) 02/27/2016  . Chronic Cervical radicular pain (Bilateral) (L>R) 02/27/2016  . Chronic lumbar radicular pain (Right) (L5 dermatome) 02/27/2016  . Lumbar facet syndrome (Bilateral) (L>R) 02/27/2016  . Long term prescription benzodiazepine use 02/27/2016  . Hypertension   . COPD (chronic obstructive pulmonary disease) ([place]) 10/09/2015  . GERD (gastroesophageal reflux disease) 10/09/2015  . Non-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/2014 08/02/2015  . Hyperlipidemia 08/02/2015  . Costochondritis 08/02/2015  . 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 GAD (generalized anxiety disorder) 09/03/2014  . Narcotic dependence ([place]) 09/03/2014  . PVD (peripheral vascular disease) ([place]) 09/03/2014  . Smoker 09/03/2014  . Cigarette nicotine dependence with nicotine-induced disorder 07/08/2014  . Anemia of chronic  disease 03/19/2014  . Narcotic abuse, continuous ([place]) 03/19/2014  . Polysubstance abuse ([place]) 03/19/2014  . MSSA (methicillin susceptible Staphylococcus aureus) septicemia ([place]) 03/07/2014  . Hypomagnesemia 01/04/2014  . Chronic cough 09/04/2011  . Sleep apnea, obstructive 09/04/2011    Past Surgical History:  Procedure Laterality Date  . ANTERIOR CERVICAL DECOMP/DISCECTOMY FUSION N/A 03/30/2018   Procedure: Cervical six-seven Anterior cervical decompression/discectomy/fusion;  Surgeon: Judith Part, MD;  Location: [facility];  Service: Neurosurgery;  Laterality: N/A;  . BACK SURGERY    . CARDIAC CATHETERIZATION Left 02/22/2016   Procedure: Left Heart Cath and Coronary Angiography;  Surgeon: Yolonda Kida, MD;  Location: [facility] CV [facility];  Service: Cardiovascular;  Laterality: Left;  . COLONOSCOPY WITH PROPOFOL N/A 09/19/2016   Procedure: COLONOSCOPY WITH PROPOFOL;  Surgeon: [name], MD;  Location: [facility] ENDOSCOPY;  Service: Endoscopy;  Laterality: N/A;  . ESOPHAGOGASTRODUODENOSCOPY (EGD) WITH PROPOFOL N/A 09/18/2017   Procedure: ESOPHAGOGASTRODUODENOSCOPY (EGD) WITH PROPOFOL;  Surgeon: [name], MD;  Location: [facility] ENDOSCOPY;  Service: Gastroenterology;  Laterality: N/A;  . hemorroids    . NASAL SINUS SURGERY    . ORIF FEMUR FRACTURE    . ORIF TIBIA & FIBULA FRACTURES    . OVARY SURGERY      Prior to Admission medications   Medication Sig Start Date End Date Taking? Authorizing Provider  albuterol (PROVENTIL HFA;VENTOLIN HFA) 108 (90 Base) MCG/ACT inhaler Inhale 2 puffs into the lungs every 6 (six) hours as needed for wheezing or shortness of breath. 05/26/17   [name], [name]  budesonide-formoterol (SYMBICORT) 160-4.5 MCG/ACT inhaler Inhale 2 puffs into the lungs 2 (two) times daily. 05/26/17   [name], [name]  docusate sodium (COLACE) 100 MG capsule Take 200 mg by mouth 2 (two) times daily as needed for mild constipation.    [provider]    DULoxetine (CYMBALTA) 60 MG capsule Take 1 capsule (60 mg total) by mouth daily. 02/12/18   [name], MD  gabapentin (NEURONTIN) 600 MG tablet Take 1 tablet (600 mg total) by mouth 3 (three) times daily. 06/26/17 03/27/18  [name], [name]  lisinopril (PRINIVIL,ZESTRIL) 40 MG tablet Take 1 tablet (40 mg total) by mouth daily. 08/05/17   [name], [name]  metoprolol tartrate (LOPRESSOR) 25 MG tablet TAKE 1 TABLET BY MOUTH TWICE A DAY Patient taking differently: Take 25 mg by mouth 2 (two) times daily.  01/21/18   [name], [name]  Naloxone HCl (NARCAN IJ) Inject as directed as needed.    [provider]  nitroGLYCERIN (NITROSTAT) 0.4 MG SL tablet Place 1 tablet (0.4 mg total) under the tongue every 5 (five) minutes x 3 doses as needed for chest pain. 08/03/15   Demetrios Loll, MD  omeprazole (PRILOSEC) 40 MG capsule Take 1 capsule (40 mg total) daily by mouth. 05/07/17   [name], MD  ondansetron (ZOFRAN-ODT) 8 MG disintegrating tablet Take 1 tablet (8 mg total) by mouth 2 (two) times daily. 02/12/18   [name], MD  rizatriptan (MAXALT-MLT) 10 MG disintegrating tablet Take 1 tablet (10 mg total) by mouth as needed. May repeat in 2 hours if needed 02/12/18   [name], MD  sucralfate (CARAFATE) 1 GM/10ML suspension Take 10 mLs (1 g total)  by mouth 4 (four) times daily. 02/25/18   [name], MD  Tiotropium Bromide Monohydrate (SPIRIVA RESPIMAT) 2.5 MCG/ACT AERS Inhale 1 puff into the lungs daily. 05/26/17   [name], [name]  tiZANidine (ZANAFLEX) 4 MG capsule Take 4 mg by mouth daily.     [provider]    Allergies Levofloxacin; Flexeril [cyclobenzaprine]; Keflex [cephalexin]; Ketorolac; Phenergan [promethazine hcl]; Toradol [ketorolac tromethamine]; Tramadol; and Zoloft [sertraline hcl]  Family History  Problem Relation Age of Onset  . Heart disease Mother   . Lung cancer Mother   . Ovarian cancer Mother   . Healthy Brother   . Healthy Brother   .  Healthy Brother   . Diabetes Maternal Uncle   . Breast cancer Maternal Aunt     Social History Social History   Tobacco Use  . Smoking status: Current Every Day Smoker    Packs/day: 0.50    Types: Cigarettes  . Smokeless tobacco: Never Used  Substance Use Topics  . Alcohol use: Yes    Alcohol/week: 0.0 standard drinks  . Drug use: Yes    Types: Marijuana    Comment: 2 months ago     Review of Systems  Constitutional: No fever. Eyes: No redness. ENT: No sore throat.  Positive for neck pain. Cardiovascular: Denies chest pain. Respiratory: Denies shortness of breath. Gastrointestinal: No vomiting.  Genitourinary: Negative for flank pain.  Musculoskeletal: Negative for back pain.  Positive for left elbow pain. Skin: Negative for rash. Neurological: Positive for headache.  Negative for weakness or numbness.   ____________________________________________   PHYSICAL EXAM:  VITAL SIGNS: ED Triage Vitals  Enc Vitals Group     BP 05/09/18 1118 (!) 164/85     Pulse Rate 05/09/18 1118 72     Resp 05/09/18 1118 18     Temp 05/09/18 1118 98.1 F (36.7 C)     Temp Source 05/09/18 1118 Oral     SpO2 05/09/18 1118 97 %     Weight 05/09/18 1117 180 lb (81.6 kg)     Height 05/09/18 1117 5\' 4"  (1.626 m)     Head Circumference --      Peak Flow --      Pain Score 05/09/18 1117 10     Pain Loc --      Pain Edu? --      Excl. in [place]? --     Constitutional: Alert and oriented. Well appearing and in no acute distress. Eyes: Conjunctivae are normal.  EOMI.  PERRLA.  Mild photophobia. Head: Atraumatic. Nose: No congestion/rhinnorhea. Mouth/Throat: Mucous membranes are moist.   Neck: Normal range of motion.  Supple.  No meningeal signs.  Mild midline tenderness with no step-off or crepitus. Cardiovascular: Good peripheral circulation. Respiratory: Normal respiratory effort. Gastrointestinal: No distention.  Musculoskeletal: Extremities warm and well perfused.  Neurologic:   Normal speech and language.  Motor and sensory intact all extremities.  Cranial nerves III through XII intact.  Normal coordination.  No gross focal neurologic deficits are appreciated.  Skin:  Skin is warm and dry. No rash noted. Psychiatric: Mood and affect are normal. Speech and behavior are normal.  ____________________________________________   LABS (all labs ordered are listed, but only abnormal results are displayed)  Labs Reviewed - No data to display ____________________________________________  EKG   ____________________________________________  RADIOLOGY  CT head: No ICH or other acute abnormality CT cervical spine: No acute fracture or hardware abnormality XR L elbow: No acute fracture  ____________________________________________  PROCEDURES  Procedure(s) performed: No  Procedures  Critical Care performed: No ____________________________________________   INITIAL IMPRESSION / ASSESSMENT AND PLAN / ED COURSE  Pertinent labs & imaging results that were available during my care of the patient were reviewed by me and considered in my medical decision making (see chart for details).  52 year old female with PMH as noted above and status post cervical discectomy and fusion last month presents with constant headache over the last 4 days and intermittent headaches since a fall 2 weeks ago, as well as neck pain and left elbow pain.  On exam she is well-appearing and her vital signs are normal except for hypertension.  Her neurologic exam is nonfocal.  CT head and cervical spine show no acute findings.  XR of the left elbow is negative.  Overall patient's presentation is consistent with postconcussion type headache.  She reports prior history of similar headaches that she describes as migraines.  I think it would be reasonable to treat as a migraine with Reglan and Benadryl.  The patient also requests something for her neck pain but is allergic to NSAIDs, so we will  give a dose of morphine.  ----------------------------------------- 1:26 PM on 05/09/2018 -----------------------------------------  Patient is feeling better after the medications.  She would like to go home.  I counseled her on the results of the imaging.  She is stable for discharge at this time.  Return precautions given, and she expressed understanding. ____________________________________________   FINAL CLINICAL IMPRESSION(S) / ED DIAGNOSES  Final diagnoses:  Post-concussion headache      NEW MEDICATIONS STARTED DURING THIS VISIT:  New Prescriptions   No medications on file     Note:  This document was prepared using Dragon voice recognition software and may include unintentional dictation errors.    Arta Silence, MD 05/09/18 1326

## 2018-05-09 NOTE — ED Notes (Signed)
Pt stuck multiple times for IV access with no success. Pt states can she just have the medication as a shot. Will inform primary RN and MD

## 2018-05-09 NOTE — ED Triage Notes (Signed)
philly collar applied

## 2018-05-14 DIAGNOSIS — M5412 Radiculopathy, cervical region: Secondary | ICD-10-CM | POA: Diagnosis not present

## 2018-05-14 DIAGNOSIS — Z6830 Body mass index (BMI) 30.0-30.9, adult: Secondary | ICD-10-CM | POA: Diagnosis not present

## 2018-05-14 DIAGNOSIS — I10 Essential (primary) hypertension: Secondary | ICD-10-CM | POA: Diagnosis not present

## 2018-05-15 ENCOUNTER — Other Ambulatory Visit: Payer: Self-pay | Admitting: Gastroenterology

## 2018-05-15 DIAGNOSIS — G894 Chronic pain syndrome: Secondary | ICD-10-CM | POA: Diagnosis not present

## 2018-05-15 DIAGNOSIS — M79621 Pain in right upper arm: Secondary | ICD-10-CM | POA: Diagnosis not present

## 2018-05-15 DIAGNOSIS — M542 Cervicalgia: Secondary | ICD-10-CM | POA: Diagnosis not present

## 2018-05-15 DIAGNOSIS — M79622 Pain in left upper arm: Secondary | ICD-10-CM | POA: Diagnosis not present

## 2018-05-15 DIAGNOSIS — M79662 Pain in left lower leg: Secondary | ICD-10-CM | POA: Diagnosis not present

## 2018-05-15 DIAGNOSIS — M545 Low back pain: Secondary | ICD-10-CM | POA: Diagnosis not present

## 2018-05-15 DIAGNOSIS — M25512 Pain in left shoulder: Secondary | ICD-10-CM | POA: Diagnosis not present

## 2018-05-15 DIAGNOSIS — M25511 Pain in right shoulder: Secondary | ICD-10-CM | POA: Diagnosis not present

## 2018-05-19 ENCOUNTER — Ambulatory Visit (INDEPENDENT_AMBULATORY_CARE_PROVIDER_SITE_OTHER): Payer: Medicaid Other | Admitting: Gastroenterology

## 2018-05-19 ENCOUNTER — Other Ambulatory Visit: Payer: Self-pay | Admitting: Neurological Surgery

## 2018-05-19 ENCOUNTER — Encounter: Payer: Self-pay | Admitting: Gastroenterology

## 2018-05-19 VITALS — BP 154/91 | HR 74 | Ht 64.0 in | Wt 182.0 lb

## 2018-05-19 DIAGNOSIS — K219 Gastro-esophageal reflux disease without esophagitis: Secondary | ICD-10-CM | POA: Diagnosis not present

## 2018-05-19 DIAGNOSIS — B1920 Unspecified viral hepatitis C without hepatic coma: Secondary | ICD-10-CM

## 2018-05-19 DIAGNOSIS — M5416 Radiculopathy, lumbar region: Secondary | ICD-10-CM

## 2018-05-19 DIAGNOSIS — R109 Unspecified abdominal pain: Secondary | ICD-10-CM | POA: Diagnosis not present

## 2018-05-19 DIAGNOSIS — R197 Diarrhea, unspecified: Secondary | ICD-10-CM

## 2018-05-19 MED ORDER — OMEPRAZOLE 40 MG PO CPDR: 40.0000 mg | DELAYED_RELEASE_CAPSULE | Freq: Every day | ORAL | 3 refills | Status: DC

## 2018-05-19 MED ORDER — SUCRALFATE 1 GM/10ML PO SUSP: 1.0000 g | Freq: Four times a day (QID) | ORAL | 1 refills | Status: DC

## 2018-05-19 NOTE — Progress Notes (Signed)
[name] MD, MRCP(U.K) [address]  [place]  [place], [place] [address]  Main: [phone]  Fax: [phone]   Primary Care Physician: [name], [name]  Primary Gastroenterologist:  Dr. [name]   No chief complaint on file.   HPI: Sara Munoz is a 52 y.o. female   Summary of history : Devera R Dunlapwas initially referred back in 05/2016 for hepatitis CGT 3.Treatment naive. Completed treatment in 08/2017 .Autoimmune screen,HIV, Hep B serology -negative in 08/2016   CT abdomen 03/17/17- increased stool burden and hepatic steatosis. Colonoscopy 08/2016 - small adenomas excised  RUQ USG 05/07/16 showed hepatic steatosis.   Labs 12/2016- Albumin 4.1,Hb 14.1 and platelet count 213 09/18/17 : EGD: normal appearance but bx showed mild active gastritis and negative for H pylori .  LFT's normal in 01/2018   Interval history 02/25/18 -05/19/18   02/25/18: H pylori breath test negative  Has been having diarrhea last 3 weeks, was constipated previously. She has 5-6 bowel movements a day . Some gas. Denies any NSAID's . Still on colace.   Has abdominal pain rt side of abdomen ongoing since 01/2018 . Almost stopped smoking.   Current Outpatient Medications  Medication Sig Dispense Refill  . albuterol (PROVENTIL HFA;VENTOLIN HFA) 108 (90 Base) MCG/ACT inhaler Inhale 2 puffs into the lungs every 6 (six) hours as needed for wheezing or shortness of breath. 1 Inhaler 2  . budesonide-formoterol (SYMBICORT) 160-4.5 MCG/ACT inhaler Inhale 2 puffs into the lungs 2 (two) times daily. 1 Inhaler 0  . docusate sodium (COLACE) 100 MG capsule Take 200 mg by mouth 2 (two) times daily as needed for mild constipation.    . DULoxetine (CYMBALTA) 60 MG capsule Take 1 capsule (60 mg total) by mouth daily. 30 capsule 12  . gabapentin (NEURONTIN) 600 MG tablet Take 1 tablet (600 mg total) by mouth 3 (three) times daily. 90 tablet 2  . lisinopril (PRINIVIL,ZESTRIL) 40 MG tablet Take  1 tablet (40 mg total) by mouth daily. 90 tablet 1  . metoprolol tartrate (LOPRESSOR) 25 MG tablet TAKE 1 TABLET BY MOUTH TWICE A DAY (Patient taking differently: Take 25 mg by mouth 2 (two) times daily. ) 180 tablet 1  . Naloxone HCl (NARCAN IJ) Inject as directed as needed.    . nitroGLYCERIN (NITROSTAT) 0.4 MG SL tablet Place 1 tablet (0.4 mg total) under the tongue every 5 (five) minutes x 3 doses as needed for chest pain. 30 tablet 2  . omeprazole (PRILOSEC) 40 MG capsule Take 1 capsule (40 mg total) daily by mouth. 90 capsule 3  . ondansetron (ZOFRAN-ODT) 8 MG disintegrating tablet Take 1 tablet (8 mg total) by mouth 2 (two) times daily. 20 tablet 6  . rizatriptan (MAXALT-MLT) 10 MG disintegrating tablet Take 1 tablet (10 mg total) by mouth as needed. May repeat in 2 hours if needed 15 tablet 6  . sucralfate (CARAFATE) 1 GM/10ML suspension Take 10 mLs (1 g total) by mouth 4 (four) times daily. 420 mL 1  . Tiotropium Bromide Monohydrate (SPIRIVA RESPIMAT) 2.5 MCG/ACT AERS Inhale 1 puff into the lungs daily. 4 g 2  . tiZANidine (ZANAFLEX) 4 MG capsule Take 4 mg by mouth daily.      No current facility-administered medications for this visit.     Allergies as of 05/19/2018 - Review Complete 05/09/2018  Allergen Reaction Noted  . Levofloxacin Hives and Swelling 02/27/2012  . Flexeril [cyclobenzaprine] Hives, Swelling, and Other (See Comments) 11/13/2013  . Keflex [  cephalexin] Hives 10/22/2010  . Ketorolac  10/22/2010  . Phenergan [promethazine hcl] Other (See Comments) 02/21/2017  . Toradol [ketorolac tromethamine] Swelling and Other (See Comments) 01/17/2015  . Tramadol Hives, Swelling, and Other (See Comments) 01/17/2015  . Zoloft [sertraline hcl] Swelling and Other (See Comments) 05/31/2015    ROS:  General: Negative for anorexia, weight loss, fever, chills, fatigue, weakness. ENT: Negative for hoarseness, difficulty swallowing , nasal congestion. CV: Negative for chest pain,  angina, palpitations, dyspnea on exertion, peripheral edema.  Respiratory: Negative for dyspnea at rest, dyspnea on exertion, cough, sputum, wheezing.  GI: See history of present illness. GU:  Negative for dysuria, hematuria, urinary incontinence, urinary frequency, nocturnal urination.  Endo: Negative for unusual weight change.    Physical Examination:   BP (!) 154/91   Pulse 74   Wt 182 lb (82.6 kg)   BMI 31.24 kg/m   General: Well-nourished, well-developed in no acute distress.  Eyes: No icterus. Conjunctivae pink. Mouth: Oropharyngeal mucosa moist and pink , no lesions erythema or exudate. Lungs: Clear to auscultation bilaterally. Non-labored. Heart: Regular rate and rhythm, no murmurs rubs or gallops.  Abdomen: Bowel sounds are normal, nontender, nondistended, no hepatosplenomegaly or masses, no abdominal bruits or hernia , no rebound or guarding.   Extremities: No lower extremity edema. No clubbing or deformities. Neuro: Alert and oriented x 3.  Grossly intact. Skin: Warm and dry, no jaundice.   Psych: Alert and cooperative, normal mood and affect.   Imaging Studies: Dg Elbow Complete Left  Result Date: 05/09/2018 CLINICAL DATA:  Left elbow pain, bruising and swelling following a fall 2 weeks ago. EXAM: LEFT ELBOW - COMPLETE 3+ VIEW COMPARISON:  None. FINDINGS: There is no evidence of fracture, dislocation, or joint effusion. There is no evidence of arthropathy or other focal bone abnormality. Soft tissues are unremarkable. IMPRESSION: Normal examination. Electronically Signed   By: Claudie Revering M.D.   On: 05/09/2018 11:48   Ct Head Wo Contrast  Result Date: 05/09/2018 CLINICAL DATA:  Fall, head injury, loss of consciousness. Worsening headaches. Neck pain. Prior neck surgery 1 month ago. EXAM: CT HEAD WITHOUT CONTRAST CT CERVICAL SPINE WITHOUT CONTRAST TECHNIQUE: Multidetector CT imaging of the head and cervical spine was performed following the standard protocol without  intravenous contrast. Multiplanar CT image reconstructions of the cervical spine were also generated. COMPARISON:  CT cervical spine dated 04/01/2018. MRI brain dated 09/11/2017. FINDINGS: CT HEAD FINDINGS Brain: No evidence of acute infarction, hemorrhage, hydrocephalus, extra-axial collection or mass lesion/mass effect. Empty sella. Vascular: Mild intracranial atherosclerosis. Skull: Normal. Negative for fracture or focal lesion. Sinuses/Orbits: The visualized paranasal sinuses are essentially clear. The mastoid air cells are unopacified. Other: None. CT CERVICAL SPINE FINDINGS Alignment: Normal upper cervical lordosis. Skull base and vertebrae: No acute fracture. No primary bone lesion or focal pathologic process. Soft tissues and spinal canal: No prevertebral fluid or swelling. No visible canal hematoma. Disc levels: Anterior cervical fixation at C6-7, without evidence of hardware complication. Very mild multilevel degenerative changes.  Spinal canal is patent. Upper chest: Visualized lung apices are clear. Other: Visualized thyroid is unremarkable. IMPRESSION: Normal head CT. Anterior cervical fixation at C6-7, without evidence of hardware complication. No evidence of traumatic injury to the cervical spine. Electronically Signed   By: Julian Hy M.D.   On: 05/09/2018 11:43   Ct Cervical Spine Wo Contrast  Result Date: 05/09/2018 CLINICAL DATA:  Fall, head injury, loss of consciousness. Worsening headaches. Neck pain. Prior neck surgery 1 month ago.  EXAM: CT HEAD WITHOUT CONTRAST CT CERVICAL SPINE WITHOUT CONTRAST TECHNIQUE: Multidetector CT imaging of the head and cervical spine was performed following the standard protocol without intravenous contrast. Multiplanar CT image reconstructions of the cervical spine were also generated. COMPARISON:  CT cervical spine dated 04/01/2018. MRI brain dated 09/11/2017. FINDINGS: CT HEAD FINDINGS Brain: No evidence of acute infarction, hemorrhage, hydrocephalus,  extra-axial collection or mass lesion/mass effect. Empty sella. Vascular: Mild intracranial atherosclerosis. Skull: Normal. Negative for fracture or focal lesion. Sinuses/Orbits: The visualized paranasal sinuses are essentially clear. The mastoid air cells are unopacified. Other: None. CT CERVICAL SPINE FINDINGS Alignment: Normal upper cervical lordosis. Skull base and vertebrae: No acute fracture. No primary bone lesion or focal pathologic process. Soft tissues and spinal canal: No prevertebral fluid or swelling. No visible canal hematoma. Disc levels: Anterior cervical fixation at C6-7, without evidence of hardware complication. Very mild multilevel degenerative changes.  Spinal canal is patent. Upper chest: Visualized lung apices are clear. Other: Visualized thyroid is unremarkable. IMPRESSION: Normal head CT. Anterior cervical fixation at C6-7, without evidence of hardware complication. No evidence of traumatic injury to the cervical spine. Electronically Signed   By: Julian Hy M.D.   On: 05/09/2018 11:43    Assessment and Plan:   MARIZOL BORROR is a 52 y.o. y/o female  here to follow upfor hepatitis C. GT 3 No biochemical or radiological evidence of cirrhosis. .Completed treatment attained SVR(cured). Today here to follow up for abdominal pain: presently persists and has diarrhea: previously had constipation    Plan  1.Check Hep B surface antibody  2.  CT scan of abdomen to evaluate abdominal pain  3. Stool tests to rule out infection  4. If above is negative will treat for IBS-D with xifaxan.   Dr [name]  MD,MRCP [facility]) Follow up in 2-3 weeks

## 2018-05-19 NOTE — Addendum Note (Signed)
Addended by: Dorethea Clan on: 05/19/2018 02:15 PM   Modules accepted: Orders

## 2018-05-20 ENCOUNTER — Other Ambulatory Visit: Payer: Self-pay

## 2018-05-20 DIAGNOSIS — R109 Unspecified abdominal pain: Secondary | ICD-10-CM

## 2018-05-20 DIAGNOSIS — B1920 Unspecified viral hepatitis C without hepatic coma: Secondary | ICD-10-CM

## 2018-05-20 LAB — HEPATITIS B SURFACE ANTIBODY,QUALITATIVE: Hep B Surface Ab, Qual: NONREACTIVE

## 2018-05-25 NOTE — Progress Notes (Deleted)
Patient: Sara Munoz Female    DOB: 1965/07/06   52 y.o.   MRN: [id number] Visit Date: 05/25/2018  Today's Provider: [name], [name]   No chief complaint on file.  Subjective:    HPI Patient here to follow-up on chronic diseases. Patient is followed by chronic care management.    Allergies  Allergen Reactions  . Levofloxacin Hives and Swelling  . Flexeril [Cyclobenzaprine] Hives, Swelling and Other (See Comments)    Reaction:  Facial/lip swelling  Reaction:  Facial/lip swelling   . Keflex [Cephalexin] Hives  . Ketorolac   . Phenergan [Promethazine Hcl] Other (See Comments)    Agitation.   . Toradol [Ketorolac Tromethamine] Swelling and Other (See Comments)    Reaction:  Facial/tongue swelling  Reaction:  Facial/tongue swelling   . Tramadol Hives, Swelling and Other (See Comments)    Reaction:  Lip swelling  Reaction:  Lip swelling   . Zoloft [Sertraline Hcl] Swelling and Other (See Comments)    Reaction:  Tongue swelling  Reaction:  Tongue swelling      Current Outpatient Medications:  .  albuterol (PROVENTIL HFA;VENTOLIN HFA) 108 (90 Base) MCG/ACT inhaler, Inhale 2 puffs into the lungs every 6 (six) hours as needed for wheezing or shortness of breath., Disp: 1 Inhaler, Rfl: 2 .  budesonide-formoterol (SYMBICORT) 160-4.5 MCG/ACT inhaler, Inhale 2 puffs into the lungs 2 (two) times daily., Disp: 1 Inhaler, Rfl: 0 .  docusate sodium (COLACE) 100 MG capsule, Take 200 mg by mouth 2 (two) times daily as needed for mild constipation., Disp: , Rfl:  .  DULoxetine (CYMBALTA) 60 MG capsule, Take 1 capsule (60 mg total) by mouth daily., Disp: 30 capsule, Rfl: 12 .  gabapentin (NEURONTIN) 600 MG tablet, Take 1 tablet (600 mg total) by mouth 3 (three) times daily., Disp: 90 tablet, Rfl: 2 .  gabapentin (NEURONTIN) 600 MG tablet, Take 600 mg by mouth every 8 (eight) hours., Disp: , Rfl: 0 .  lisinopril (PRINIVIL,ZESTRIL) 40 MG tablet, Take 1 tablet (40 mg total) by  mouth daily., Disp: 90 tablet, Rfl: 1 .  metoprolol tartrate (LOPRESSOR) 25 MG tablet, TAKE 1 TABLET BY MOUTH TWICE A DAY (Patient taking differently: Take 25 mg by mouth 2 (two) times daily. ), Disp: 180 tablet, Rfl: 1 .  Naloxone HCl (NARCAN IJ), Inject as directed as needed., Disp: , Rfl:  .  nitroGLYCERIN (NITROSTAT) 0.4 MG SL tablet, Place 1 tablet (0.4 mg total) under the tongue every 5 (five) minutes x 3 doses as needed for chest pain., Disp: 30 tablet, Rfl: 2 .  omeprazole (PRILOSEC) 40 MG capsule, Take 1 capsule (40 mg total) by mouth daily., Disp: 90 capsule, Rfl: 3 .  ondansetron (ZOFRAN-ODT) 8 MG disintegrating tablet, Take 1 tablet (8 mg total) by mouth 2 (two) times daily., Disp: 20 tablet, Rfl: 6 .  oxyCODONE (OXY IR/ROXICODONE) 5 MG immediate release tablet, Take 5 mg by mouth 4 (four) times daily., Disp: , Rfl: 0 .  rizatriptan (MAXALT-MLT) 10 MG disintegrating tablet, Take 1 tablet (10 mg total) by mouth as needed. May repeat in 2 hours if needed, Disp: 15 tablet, Rfl: 6 .  sucralfate (CARAFATE) 1 GM/10ML suspension, Take 10 mLs (1 g total) by mouth 4 (four) times daily., Disp: 420 mL, Rfl: 1 .  Tiotropium Bromide Monohydrate (SPIRIVA RESPIMAT) 2.5 MCG/ACT AERS, Inhale 1 puff into the lungs daily., Disp: 4 g, Rfl: 2 .  tiZANidine (ZANAFLEX) 4 MG capsule, Take 4  mg by mouth daily. , Disp: , Rfl:   Review of Systems  Social History   Tobacco Use  . Smoking status: Current Every Day Smoker    Packs/day: 0.50    Types: Cigarettes  . Smokeless tobacco: Never Used  Substance Use Topics  . Alcohol use: Yes    Alcohol/week: 0.0 standard drinks   Objective:   There were no vitals taken for this visit. There were no vitals filed for this visit.   Physical Exam      Assessment & Plan:           [name], [name]  [facility]

## 2018-05-26 ENCOUNTER — Ambulatory Visit: Admission: RE | Admit: 2018-05-26 | Payer: Medicaid Other | Source: Ambulatory Visit

## 2018-05-26 ENCOUNTER — Ambulatory Visit: Payer: Self-pay | Admitting: Physician Assistant

## 2018-05-27 ENCOUNTER — Telehealth: Payer: Self-pay

## 2018-05-27 NOTE — Telephone Encounter (Signed)
Called pt to inform her of lab results and Dr. Georgeann Oppenheim instructions for pt to have Hep B vaccine. Unable to contact, VM full

## 2018-05-27 NOTE — Telephone Encounter (Signed)
-----   Message from [name], MD sent at 05/25/2018 10:49 AM EST ----- Sherald Hess inform needs hep B vaccine  Dr [name] MD,MRCP [facility]) Gastroenterology/Hepatology Pager: [phone]

## 2018-05-29 ENCOUNTER — Telehealth: Payer: Self-pay | Admitting: Gastroenterology

## 2018-05-29 NOTE — Telephone Encounter (Signed)
Sara Munoz with  services left vm to acquire authorization for pt ct Abdomen,Pelvis with Contract  06/02/18 please call [phone]

## 2018-06-01 ENCOUNTER — Telehealth: Payer: Self-pay | Admitting: Gastroenterology

## 2018-06-01 NOTE — Telephone Encounter (Signed)
Pt is calling to reschedule her CT Scan

## 2018-06-02 ENCOUNTER — Ambulatory Visit: Admission: RE | Admit: 2018-06-02 | Payer: Medicaid Other | Source: Ambulatory Visit

## 2018-06-05 NOTE — Telephone Encounter (Signed)
Tried contacting pt to reschedule CT scan. Voicemail is not set up. Pt will need to reschedule within the insurance approval date.   Valid from 05/29/18 - 06/28/18

## 2018-06-09 ENCOUNTER — Ambulatory Visit
Admission: RE | Admit: 2018-06-09 | Discharge: 2018-06-09 | Disposition: A | Discharge status: Home / Self Care | Payer: Medicaid Other | Source: Ambulatory Visit | Attending: Gastroenterology | Admitting: Gastroenterology

## 2018-06-09 DIAGNOSIS — R109 Unspecified abdominal pain: Secondary | ICD-10-CM | POA: Insufficient documentation

## 2018-06-09 DIAGNOSIS — R197 Diarrhea, unspecified: Secondary | ICD-10-CM | POA: Diagnosis not present

## 2018-06-09 DIAGNOSIS — B1920 Unspecified viral hepatitis C without hepatic coma: Secondary | ICD-10-CM | POA: Diagnosis not present

## 2018-06-09 LAB — POCT I-STAT CREATININE: Creatinine, Ser: 0.8 mg/dL (ref 0.44–1.00)

## 2018-06-09 MED ORDER — IOPAMIDOL (ISOVUE-300) INJECTION 61%
100.0000 mL | Freq: Once | INTRAVENOUS | Status: AC | PRN
  Administered 2018-06-09: 100 mL via INTRAVENOUS

## 2018-06-09 NOTE — Telephone Encounter (Signed)
Spoke with pt she is scheduled to have her CT scan today.

## 2018-06-09 NOTE — Telephone Encounter (Signed)
Spoke with pt and informed her of lab results and Dr. Georgeann Oppenheim instructions to get the Hepatitis B vaccine. I suggested pt contact her PCP or the local health dept to receive this vaccination. Pt agrees to do so.

## 2018-06-10 ENCOUNTER — Encounter: Payer: Self-pay | Admitting: Gastroenterology

## 2018-06-11 ENCOUNTER — Ambulatory Visit
Admission: RE | Admit: 2018-06-11 | Discharge: 2018-06-11 | Disposition: A | Discharge status: Home / Self Care | Payer: Medicaid Other | Source: Ambulatory Visit | Attending: Neurological Surgery | Admitting: Neurological Surgery

## 2018-06-11 DIAGNOSIS — M5416 Radiculopathy, lumbar region: Secondary | ICD-10-CM | POA: Diagnosis present

## 2018-06-11 DIAGNOSIS — M4807 Spinal stenosis, lumbosacral region: Secondary | ICD-10-CM | POA: Insufficient documentation

## 2018-06-11 DIAGNOSIS — M5117 Intervertebral disc disorders with radiculopathy, lumbosacral region: Secondary | ICD-10-CM | POA: Diagnosis not present

## 2018-06-11 DIAGNOSIS — M545 Low back pain: Secondary | ICD-10-CM | POA: Diagnosis not present

## 2018-06-13 ENCOUNTER — Emergency Department: Payer: Medicaid Other

## 2018-06-13 ENCOUNTER — Encounter: Payer: Self-pay | Admitting: Medical Oncology

## 2018-06-13 ENCOUNTER — Emergency Department
Admission: EM | Admit: 2018-06-13 | Discharge: 2018-06-13 | Disposition: A | Discharge status: Home / Self Care | Payer: Medicaid Other | Attending: Emergency Medicine | Admitting: Emergency Medicine

## 2018-06-13 DIAGNOSIS — Z79899 Other long term (current) drug therapy: Secondary | ICD-10-CM | POA: Diagnosis not present

## 2018-06-13 DIAGNOSIS — R05 Cough: Secondary | ICD-10-CM | POA: Diagnosis present

## 2018-06-13 DIAGNOSIS — J209 Acute bronchitis, unspecified: Secondary | ICD-10-CM | POA: Diagnosis not present

## 2018-06-13 DIAGNOSIS — J449 Chronic obstructive pulmonary disease, unspecified: Secondary | ICD-10-CM | POA: Diagnosis not present

## 2018-06-13 DIAGNOSIS — F17210 Nicotine dependence, cigarettes, uncomplicated: Secondary | ICD-10-CM | POA: Insufficient documentation

## 2018-06-13 DIAGNOSIS — J101 Influenza due to other identified influenza virus with other respiratory manifestations: Secondary | ICD-10-CM | POA: Diagnosis not present

## 2018-06-13 DIAGNOSIS — I259 Chronic ischemic heart disease, unspecified: Secondary | ICD-10-CM | POA: Insufficient documentation

## 2018-06-13 DIAGNOSIS — I10 Essential (primary) hypertension: Secondary | ICD-10-CM | POA: Insufficient documentation

## 2018-06-13 MED ORDER — PSEUDOEPH-BROMPHEN-DM 30-2-10 MG/5ML PO SYRP: 5.0000 mL | ORAL_SOLUTION | Freq: Four times a day (QID) | ORAL | 0 refills | Status: DC | PRN

## 2018-06-13 MED ORDER — PREDNISONE 10 MG PO TABS: ORAL_TABLET | ORAL | 0 refills | Status: DC

## 2018-06-13 MED ORDER — IPRATROPIUM-ALBUTEROL 0.5-2.5 (3) MG/3ML IN SOLN
3.0000 mL | Freq: Once | RESPIRATORY_TRACT | Status: AC
  Administered 2018-06-13: 3 mL via RESPIRATORY_TRACT
  Filled 2018-06-13: qty 3

## 2018-06-13 NOTE — ED Provider Notes (Signed)
[facility] Emergency Department Provider Note  ____________________________________________   First MD Initiated Contact with Patient 06/13/18 1313     (approximate)  I have reviewed the triage vital signs and the nursing notes.   HISTORY  Chief Complaint URI and Cough   HPI Sara Munoz is a 52 y.o. female presents to the ED with complaint of flulike symptoms for 1 week.  Patient complains of body ache, fever, chills, sore throat and cough.  She has been taking over-the-counter medication with minimal relief.  Patient is a smoker and continues to do so.  She states that she has a nebulizer machine at home and also inhalers.  She rates her discomfort as a 9/10.   Past Medical History:  Diagnosis Date  . Anxiety   . Asthma   . Atypical chest pain 08/02/2015  . CAD (coronary artery disease)   . Chronic back pain   . COPD (chronic obstructive pulmonary disease) ([place])   . Coronary artery disease    a. Mild-non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/2014  . GERD (gastroesophageal reflux disease)   . Hypercholesteremia   . Hypertension   . Hypokalemia   . Hypomagnesemia 01/04/2014  . Liver disease   . MI (myocardial infarction) ([place])   . Opiate use 02/27/2016  . Osteoarthritis   . Stroke [facility])     Patient Active Problem List   Diagnosis Date Noted  . Cervical radiculopathy 02/12/2018  . Chronic migraine 02/12/2018  . Paresthesia 08/29/2017  . Neck pain 08/29/2017  . Daily headache 08/29/2017  . Chronic active hepatitis ([place]) 07/29/2017  . Neurogenic pain 07/31/2016  . Chronic pain syndrome 06/05/2016  . Carotid artery stenosis 02/27/2016  . Chronic constipation 02/27/2016  . Chronic nausea 02/27/2016  . Hypercholesterolemia 02/27/2016  . Osteoarthritis 02/27/2016  . PTSD (post-traumatic stress disorder) 02/27/2016  . TIA (transient ischemic attack) 02/27/2016  . Long term current use of opiate analgesic 02/27/2016  . Long term prescription opiate  use 02/27/2016  . Encounter for pain management consult 02/27/2016  . Chronic hip pain (Location of Tertiary source of pain) (Bilateral) (L>R) 02/27/2016  . Chronic knee pain (Bilateral) (L>R) 02/27/2016  . Chronic shoulder pain (Bilateral) (L>R) 02/27/2016  . Chronic sacroiliac joint pain (Bilateral) (L>R) 02/27/2016  . Chronic low back pain (Location of Primary Source of Pain) (Bilateral) (L>R) 02/27/2016  . Chronic lower extremity pain (Location of Secondary source of pain) (Bilateral) (L>R) 02/27/2016  . Osteoarthritis of hip (Bilateral) (L>R) 02/27/2016  . Chronic neck pain (posterior midline) (Bilateral) (L>R) 02/27/2016  . Cervicogenic headache (Bilateral) (L>R) 02/27/2016  . Occipital headache (Bilateral) (L>R) 02/27/2016  . Chronic upper extremity pain (Bilateral) (L>R) 02/27/2016  . Chronic Cervical radicular pain (Bilateral) (L>R) 02/27/2016  . Chronic lumbar radicular pain (Right) (L5 dermatome) 02/27/2016  . Lumbar facet syndrome (Bilateral) (L>R) 02/27/2016  . Long term prescription benzodiazepine use 02/27/2016  . Hypertension   . COPD (chronic obstructive pulmonary disease) ([place]) 10/09/2015  . GERD (gastroesophageal reflux disease) 10/09/2015  . Non-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/2014 08/02/2015  . Hyperlipidemia 08/02/2015  . Costochondritis 08/02/2015  . 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 GAD (generalized anxiety disorder) 09/03/2014  . Narcotic dependence ([place]) 09/03/2014  . PVD (peripheral vascular disease) ([place]) 09/03/2014  . Smoker 09/03/2014  . Cigarette nicotine dependence with nicotine-induced disorder 07/08/2014  . Anemia of chronic disease 03/19/2014  . Narcotic abuse, continuous ([place]) 03/19/2014  . Polysubstance abuse ([place]) 03/19/2014  . MSSA (methicillin susceptible Staphylococcus  aureus) septicemia ([place]) 03/07/2014  . Hypomagnesemia 01/04/2014  . Chronic cough 09/04/2011  . Sleep apnea, obstructive 09/04/2011    Past Surgical History:  Procedure  Laterality Date  . ANTERIOR CERVICAL DECOMP/DISCECTOMY FUSION N/A 03/30/2018   Procedure: Cervical six-seven Anterior cervical decompression/discectomy/fusion;  Surgeon: Judith Part, MD;  Location: [place];  Service: Neurosurgery;  Laterality: N/A;  . BACK SURGERY    . CARDIAC CATHETERIZATION Left 02/22/2016   Procedure: Left Heart Cath and Coronary Angiography;  Surgeon: Yolonda Kida, MD;  Location: [place] [facility];  Service: Cardiovascular;  Laterality: Left;  . COLONOSCOPY WITH PROPOFOL N/A 09/19/2016   Procedure: COLONOSCOPY WITH PROPOFOL;  Surgeon: [name], MD;  Location: [facility] ENDOSCOPY;  Service: Endoscopy;  Laterality: N/A;  . ESOPHAGOGASTRODUODENOSCOPY (EGD) WITH PROPOFOL N/A 09/18/2017   Procedure: ESOPHAGOGASTRODUODENOSCOPY (EGD) WITH PROPOFOL;  Surgeon: [name], MD;  Location[facility]COPY;  Service: Gastroenterology;  Laterality: N/A;  . hemorroids    . NASAL SINUS SURGERY    . ORIF FEMUR FRACTURE    . ORIF TIBIA & FIBULA FRACTURES    . OVARY SURGERY      Prior to Admission medications   Medication Sig Start Date End Date Taking? Authorizing Provider  albuterol (PROVENTIL HFA;VENTOLIN HFA) 108 (90 Base) MCG/ACT inhaler Inhale 2 puffs into the lungs every 6 (six) hours as needed for wheezing or shortness of breath. 05/26/17   [name], [name]  brompheniramine-pseudoephedrine-DM 30-2-10 MG/5ML syrup Take 5 mLs by mouth 4 (four) times daily as needed. 06/13/18   Johnn Hai, [name]  budesonide-formoterol [facility]) 160-4.5 MCG/ACT inhaler Inhale 2 puffs into the lungs 2 (two) times daily. 05/26/17   [name], [name]  docusate sodium (COLACE) 100 MG capsule Take 200 mg by mouth 2 (two) times daily as needed for mild constipation.    [provider]  DULoxetine (CYMBALTA) 60 MG capsule Take 1 capsule (60 mg total) by mouth daily. 02/12/18   [name], MD  gabapentin (NEURONTIN) 600 MG tablet Take 1 tablet (600 mg total) by mouth 3 (three)  times daily. 06/26/17 03/27/18  [name], [name]  gabapentin (NEURONTIN) 600 MG tablet Take 600 mg by mouth every 8 (eight) hours. 04/19/18   [provider]  lisinopril (PRINIVIL,ZESTRIL) 40 MG tablet Take 1 tablet (40 mg total) by mouth daily. 08/05/17   [name], [name]  metoprolol tartrate (LOPRESSOR) 25 MG tablet TAKE 1 TABLET BY MOUTH TWICE A DAY Patient taking differently: Take 25 mg by mouth 2 (two) times daily.  01/21/18   [name], [name]  Naloxone HCl (NARCAN IJ) Inject as directed as needed.    [provider]  nitroGLYCERIN (NITROSTAT) 0.4 MG SL tablet Place 1 tablet (0.4 mg total) under the tongue every 5 (five) minutes x 3 doses as needed for chest pain. 08/03/15   Demetrios Loll, MD  omeprazole (PRILOSEC) 40 MG capsule Take 1 capsule (40 mg total) by mouth daily. 05/19/18   [name], MD  ondansetron (ZOFRAN-ODT) 8 MG disintegrating tablet Take 1 tablet (8 mg total) by mouth 2 (two) times daily. 02/12/18   [name], MD  oxyCODONE (OXY IR/ROXICODONE) 5 MG immediate release tablet Take 5 mg by mouth 4 (four) times daily. 04/19/18   [provider]  predniSONE (DELTASONE) 10 MG tablet Take 3 tablets once a day for 5 days 06/13/18   Johnn Hai, [name]  rizatriptan (MAXALT-MLT) 10 MG disintegrating tablet Take 1 tablet (10 mg total) by  mouth as needed. May repeat in 2 hours if needed 02/12/18   [name], MD  sucralfate (CARAFATE) 1 GM/10ML suspension Take 10 mLs (1 g total) by mouth 4 (four) times daily. 05/19/18   [name], MD  Tiotropium Bromide Monohydrate (SPIRIVA RESPIMAT) 2.5 MCG/ACT AERS Inhale 1 puff into the lungs daily. 05/26/17   [name], [name]  tiZANidine (ZANAFLEX) 4 MG capsule Take 4 mg by mouth daily.     [provider]    Allergies Levofloxacin; Flexeril [cyclobenzaprine]; Keflex [cephalexin]; Ketorolac; Phenergan [promethazine hcl]; Toradol [ketorolac tromethamine]; Tramadol; and Zoloft [sertraline  hcl]  Family History  Problem Relation Age of Onset  . Heart disease Mother   . Lung cancer Mother   . Ovarian cancer Mother   . Healthy Brother   . Healthy Brother   . Healthy Brother   . Diabetes Maternal Uncle   . Breast cancer Maternal Aunt     Social History Social History   Tobacco Use  . Smoking status: Current Every Day Smoker    Packs/day: 0.50    Types: Cigarettes  . Smokeless tobacco: Never Used  Substance Use Topics  . Alcohol use: Yes    Alcohol/week: 0.0 standard drinks  . Drug use: Yes    Types: Marijuana    Comment: 2 months ago     Review of Systems Constitutional: Subjective fever/chills Eyes: No visual changes. ENT: Positive sore throat. Cardiovascular: Denies chest pain. Respiratory: Denies shortness of breath.  Positive cough.   Gastrointestinal: No abdominal pain.  No nausea, no vomiting.  No diarrhea.   Musculoskeletal: Positive for muscle aches. Skin: Negative for rash. Neurological: Negative for headaches, focal weakness or numbness. ____________________________________________   PHYSICAL EXAM:  VITAL SIGNS: ED Triage Vitals  Enc Vitals Group     BP 06/13/18 1246 (!) 157/100     Pulse Rate 06/13/18 1246 90     Resp 06/13/18 1246 18     Temp 06/13/18 1246 97.6 F (36.4 C)     Temp Source 06/13/18 1246 Oral     SpO2 06/13/18 1246 97 %     Weight 06/13/18 1245 180 lb 12.4 oz (82 kg)     Height --      Head Circumference --      Peak Flow --      Pain Score 06/13/18 1245 9     Pain Loc --      Pain Edu? --      Excl. in [place]? --    Constitutional: Alert and oriented. Well appearing and in no acute distress. Eyes: Conjunctivae are normal. PERRL. EOMI. Head: Atraumatic. Nose: Mild congestion/rhinnorhea.  TMs are dull bilaterally but no erythema or injection is seen. Mouth/Throat: Mucous membranes are moist.  Oropharynx non-erythematous.  No exudate.  Uvula is midline.  Moderate posterior drainage noted. Neck: No stridor.    Hematological/Lymphatic/Immunilogical: No cervical lymphadenopathy. Cardiovascular: Normal rate, regular rhythm. Grossly normal heart sounds.  Good peripheral circulation. Respiratory: Normal respiratory effort.  No retractions. Lungs CTAB. Musculoskeletal: His upper and lower extremities without any difficulty.  Normal gait was noted. Neurologic:  Normal speech and language. No gross focal neurologic deficits are appreciated. No gait instability. Skin:  Skin is warm, dry and intact. No rash noted. Psychiatric: Mood and affect are normal. Speech and behavior are normal.  ____________________________________________   LABS (all labs ordered are listed, but only abnormal results are displayed)  Labs Reviewed - No data to display RADIOLOGY  ED MD interpretation:  Chest x-ray is negative for infiltrate.  Official radiology report(s): Dg Chest Portable 1 View  Result Date: 06/13/2018 CLINICAL DATA:  Flu-like symptoms 1 week. EXAM: PORTABLE CHEST 1 VIEW COMPARISON:  01/11/2018 FINDINGS: Lungs are adequately inflated and otherwise clear. Cardiomediastinal silhouette and remainder of the exam is unremarkable. IMPRESSION: No active disease. Electronically Signed   By: Marin Olp M.D.   On: 06/13/2018 13:38    ____________________________________________   PROCEDURES  Procedure(s) performed: None  Procedures  Critical Care performed: No  ____________________________________________   INITIAL IMPRESSION / ASSESSMENT AND PLAN / ED COURSE  As part of my medical decision making, I reviewed the following data within the electronic MEDICAL RECORD NUMBER Notes from prior ED visits and [place] Controlled Substance Database  Patient presents to the ED with complaint of flulike symptoms for 1 week.  Patient has taken over-the-counter medication without any relief.  She continues to smoke cigarettes.  Patient relates that she does have a nebulizer machine and inhalers at home.  She has a positive  bronchitis history.  She was given a DuoNeb treatment while in the ED.  She was discharged with prescription for prednisone and Bromfed-DM.  She is encouraged to follow-up with her PCP if any continued problems.  ____________________________________________   FINAL CLINICAL IMPRESSION(S) / ED DIAGNOSES  Final diagnoses:  Acute bronchitis, unspecified organism  Cigarette smoker     ED Discharge Orders         Ordered    predniSONE (DELTASONE) 10 MG tablet     06/13/18 1352    brompheniramine-pseudoephedrine-DM 30-2-10 MG/5ML syrup  4 times daily PRN     06/13/18 1352           Note:  This document was prepared using Dragon voice recognition software and may include unintentional dictation errors.    Johnn Hai, [name] 06/13/18 1606    Harvest Dark, MD 06/14/18 Darlin Drop

## 2018-06-13 NOTE — Discharge Instructions (Addendum)
Follow-up with your primary care provider provider at [facility] family practice for any continued problems.  Take Bromfed-DM as needed for cough and congestion.  Begin taking prednisone today and every day for the next 5 days.  Increase fluids.  Decrease or discontinue smoking.  Continue using your nebulizer machine at home.  This can be used 4 times a day if needed for coughing or wheezing.  Return to the emergency department over the holiday weekend if any severe worsening of your symptoms.

## 2018-06-13 NOTE — ED Notes (Signed)
Pt has completed breathing treatment but continues to feel SOB with a horse voice. Provider made aware.

## 2018-06-13 NOTE — ED Triage Notes (Signed)
Pt reports that she has been having flu like sx's x 1 week with body aches, fever, chills, sore throat, cough.

## 2018-06-19 DIAGNOSIS — G894 Chronic pain syndrome: Secondary | ICD-10-CM | POA: Diagnosis not present

## 2018-06-19 DIAGNOSIS — M79622 Pain in left upper arm: Secondary | ICD-10-CM | POA: Diagnosis not present

## 2018-06-19 DIAGNOSIS — M79662 Pain in left lower leg: Secondary | ICD-10-CM | POA: Diagnosis not present

## 2018-06-19 DIAGNOSIS — M79621 Pain in right upper arm: Secondary | ICD-10-CM | POA: Diagnosis not present

## 2018-06-23 ENCOUNTER — Ambulatory Visit (INDEPENDENT_AMBULATORY_CARE_PROVIDER_SITE_OTHER): Payer: Medicaid Other | Admitting: Physician Assistant

## 2018-06-23 ENCOUNTER — Encounter: Payer: Self-pay | Admitting: Physician Assistant

## 2018-06-23 VITALS — BP 152/95 | HR 80 | Temp 97.2°F | Resp 16 | Wt 186.0 lb

## 2018-06-23 DIAGNOSIS — Z23 Encounter for immunization: Secondary | ICD-10-CM

## 2018-06-23 DIAGNOSIS — J42 Unspecified chronic bronchitis: Secondary | ICD-10-CM

## 2018-06-23 DIAGNOSIS — I10 Essential (primary) hypertension: Secondary | ICD-10-CM | POA: Diagnosis not present

## 2018-06-23 DIAGNOSIS — K739 Chronic hepatitis, unspecified: Secondary | ICD-10-CM

## 2018-06-23 DIAGNOSIS — E785 Hyperlipidemia, unspecified: Secondary | ICD-10-CM | POA: Diagnosis not present

## 2018-06-23 MED ORDER — HYDROCHLOROTHIAZIDE 25 MG PO TABS: 25.0000 mg | ORAL_TABLET | Freq: Every day | ORAL | 0 refills | Status: DC

## 2018-06-23 NOTE — Progress Notes (Signed)
Patient: Sara Munoz Female    DOB: [birth year]   52 y.o.   MRN: [id number] Visit Date: 07/09/2018  Today's Provider: [name], [name]   Chief Complaint  Patient presents with  . Follow-up    Hep B vaccine   Subjective:     HPI  Patient here for follow-up HTN and COPD and HLD. She is followed by care management for chronic diseases.She also reports she needs another Hep B.  HTN: Lisinopril 40 mg QD,  QD, Metoprolol Tartrate 25 mg BID. Patient reports her diet is adherent to low salt. She reports her diet is "unhealthy'. She exercises none. She denies chest pain, fatigue,leg swelling or visual disturbances.  BP Readings from Last 3 Encounters:  06/23/18 (!) 152/95  06/13/18 (!) 140/94  05/19/18 (!) 154/91     COPD: Patient takes Spiriva daily and Symbicort. Albuterol as needed. Continues to smoke. Reports frequent cough and shortness of breath.   HLD: Patient was on lipitor 20 mg daily for HLD but this had been stopped for hepatitis C treatment which has since been completed.   Lipid Panel     Component Value Date/Time   CHOL 251 (H) 02/20/2018 1230   TRIG 220 (H) 02/20/2018 1230   HDL 46 02/20/2018 1230   CHOLHDL 5.5 (H) 02/20/2018 1230   CHOLHDL 5.3 08/02/2015 0606   VLDL 24 08/02/2015 0606   LDLCALC 161 (H) 02/20/2018 1230      03/30/2018 neck surgery, ACDF C6-C7. Continues with chronic opioid therapy at [facility]. She receives oxycodone 5 mg IR QID, gabapentin 600 mg TID and cymbatla. She has presented twice to the ER since surgery, once on 04/01/2018 and once on 05/09/2018 for neck pain and headache. She received IV fentanyl at the first visit and morphine at the second.   Able to afford medications? - yes to BP and inhalers Transport to appointments with boyfriend and medicaid transportation -able to pick medications up from pharmacy    Allergies  Allergen Reactions  . Levofloxacin Hives and Swelling  . Flexeril [Cyclobenzaprine]  Hives, Swelling and Other (See Comments)    Reaction:  Facial/lip swelling  Reaction:  Facial/lip swelling   . Keflex [Cephalexin] Hives  . Ketorolac   . Phenergan [Promethazine Hcl] Other (See Comments)    Agitation.   . Toradol [Ketorolac Tromethamine] Swelling and Other (See Comments)    Reaction:  Facial/tongue swelling  Reaction:  Facial/tongue swelling   . Tramadol Hives, Swelling and Other (See Comments)    Reaction:  Lip swelling  Reaction:  Lip swelling   . Zoloft [Sertraline Hcl] Swelling and Other (See Comments)    Reaction:  Tongue swelling  Reaction:  Tongue swelling      Current Outpatient Medications:  .  albuterol (PROVENTIL HFA;VENTOLIN HFA) 108 (90 Base) MCG/ACT inhaler, Inhale 2 puffs into the lungs every 6 (six) hours as needed for wheezing or shortness of breath., Disp: 1 Inhaler, Rfl: 2 .  budesonide-formoterol (SYMBICORT) 160-4.5 MCG/ACT inhaler, Inhale 2 puffs into the lungs 2 (two) times daily., Disp: 1 Inhaler, Rfl: 0 .  docusate sodium (COLACE) 100 MG capsule, Take 200 mg by mouth 2 (two) times daily as needed for mild constipation., Disp: , Rfl:  .  DULoxetine (CYMBALTA) 60 MG capsule, Take 1 capsule (60 mg total) by mouth daily., Disp: 30 capsule, Rfl: 12 .  gabapentin (NEURONTIN) 600 MG tablet, Take 1 tablet (600 mg total) by mouth 3 (three) times daily.,  Disp: 90 tablet, Rfl: 2 .  lisinopril (PRINIVIL,ZESTRIL) 40 MG tablet, Take 1 tablet (40 mg total) by mouth daily., Disp: 90 tablet, Rfl: 1 .  metoprolol tartrate (LOPRESSOR) 25 MG tablet, TAKE 1 TABLET BY MOUTH TWICE A DAY (Patient taking differently: Take 25 mg by mouth 2 (two) times daily. ), Disp: 180 tablet, Rfl: 1 .  Naloxone HCl (NARCAN IJ), Inject as directed as needed., Disp: , Rfl:  .  nitroGLYCERIN (NITROSTAT) 0.4 MG SL tablet, Place 1 tablet (0.4 mg total) under the tongue every 5 (five) minutes x 3 doses as needed for chest pain., Disp: 30 tablet, Rfl: 2 .  omeprazole (PRILOSEC) 40 MG capsule,  Take 1 capsule (40 mg total) by mouth daily., Disp: 90 capsule, Rfl: 3 .  ondansetron (ZOFRAN-ODT) 8 MG disintegrating tablet, Take 1 tablet (8 mg total) by mouth 2 (two) times daily., Disp: 20 tablet, Rfl: 6 .  oxyCODONE (OXY IR/ROXICODONE) 5 MG immediate release tablet, Take 5 mg by mouth 4 (four) times daily. , Disp: , Rfl: 0 .  rizatriptan (MAXALT-MLT) 10 MG disintegrating tablet, Take 1 tablet (10 mg total) by mouth as needed. May repeat in 2 hours if needed, Disp: 15 tablet, Rfl: 6 .  sucralfate (CARAFATE) 1 GM/10ML suspension, Take 10 mLs (1 g total) by mouth 4 (four) times daily., Disp: 420 mL, Rfl: 1 .  Tiotropium Bromide Monohydrate (SPIRIVA RESPIMAT) 2.5 MCG/ACT AERS, Inhale 1 puff into the lungs daily., Disp: 4 g, Rfl: 2 .  tiZANidine (ZANAFLEX) 4 MG capsule, Take 4 mg by mouth daily. , Disp: , Rfl:  .  atorvastatin (LIPITOR) 20 MG tablet, Take 1 tablet (20 mg total) by mouth daily., Disp: 90 tablet, Rfl: 3 .  gabapentin (NEURONTIN) 600 MG tablet, Take 600 mg by mouth every 8 (eight) hours., Disp: , Rfl: 0 .  hydrochlorothiazide (HYDRODIURIL) 25 MG tablet, Take 1 tablet (25 mg total) by mouth daily., Disp: 90 tablet, Rfl: 0  Review of Systems  Social History   Tobacco Use  . Smoking status: Current Every Day Smoker    Packs/day: 0.50    Types: Cigarettes  . Smokeless tobacco: Never Used  Substance Use Topics  . Alcohol use: Yes    Alcohol/week: 0.0 standard drinks      Objective:   BP (!) 152/95 (BP Location: Left Arm, Patient Position: Sitting, Cuff Size: Normal)   Pulse 80   Temp (!) 97.2 F (36.2 C) (Oral)   Resp 16   Wt 186 lb (84.4 kg)   BMI 31.93 kg/m  Vitals:   06/23/18 0832  BP: (!) 152/95  Pulse: 80  Resp: 16  Temp: (!) 97.2 F (36.2 C)  TempSrc: Oral  Weight: 186 lb (84.4 kg)     Physical Exam Constitutional:      Appearance: Normal appearance.  HENT:     Right Ear: Tympanic membrane normal.     Mouth/Throat:     Mouth: Mucous membranes are  moist.     Pharynx: Oropharynx is clear.  Cardiovascular:     Rate and Rhythm: Normal rate and regular rhythm.     Heart sounds: Normal heart sounds.  Pulmonary:     Effort: Pulmonary effort is normal.     Breath sounds: Wheezing present.  Skin:    General: Skin is warm and dry.  Neurological:     Mental Status: She is alert and oriented to person, place, and time. Mental status is at baseline.  Psychiatric:  Mood and Affect: Mood normal.        Behavior: Behavior normal.         Assessment & Plan    1. Essential hypertension  Add HCTZ as below for uncontrolled HTN.   - hydrochlorothiazide (HYDRODIURIL) 25 MG tablet; Take 1 tablet (25 mg total) by mouth daily.  Dispense: 90 tablet; Refill: 0 - Ambulatory referral to Pulmonology - Comprehensive Metabolic Panel (CMET)  2. Chronic bronchitis, unspecified chronic bronchitis type ([place])  Still symptomatic despite therapy. Counseled on smoking cessation.   - Ambulatory referral to Pulmonology  3. Hyperlipidemia, unspecified hyperlipidemia type  HLD uncontrolled. Patient has completed hepatitis C treatment. Have communicated with Dr. Vicente Males and he reports it is OK to resume Lipitor and will do so as below.  - Lipid Profile - atorvastatin (LIPITOR) 20 MG tablet; Take 1 tablet (20 mg total) by mouth daily.  Dispense: 90 tablet; Refill: 3  4. Hepatitis, chronic (HCC)  Restart Hepatitis B vaccine today. If she does not convert after second series, she likely will not.  Return in about 3 months (around 09/22/2018) for Chronic .  The entirety of the information documented in the History of Present Illness, Review of Systems and Physical Exam were personally obtained by me. Portions of this information were initially documented by Lyndel Pleasure, CMA and reviewed by me for thoroughness and accuracy.           [name], [name]  [facility]

## 2018-07-02 ENCOUNTER — Ambulatory Visit: Payer: Self-pay | Admitting: Gastroenterology

## 2018-07-02 DIAGNOSIS — Z6831 Body mass index (BMI) 31.0-31.9, adult: Secondary | ICD-10-CM | POA: Diagnosis not present

## 2018-07-02 DIAGNOSIS — I10 Essential (primary) hypertension: Secondary | ICD-10-CM | POA: Diagnosis not present

## 2018-07-02 DIAGNOSIS — M5416 Radiculopathy, lumbar region: Secondary | ICD-10-CM | POA: Diagnosis not present

## 2018-07-06 ENCOUNTER — Other Ambulatory Visit: Payer: Self-pay | Admitting: Neurological Surgery

## 2018-07-06 DIAGNOSIS — M5416 Radiculopathy, lumbar region: Secondary | ICD-10-CM

## 2018-07-09 ENCOUNTER — Telehealth: Payer: Self-pay

## 2018-07-09 MED ORDER — ATORVASTATIN CALCIUM 20 MG PO TABS: 20.0000 mg | ORAL_TABLET | Freq: Every day | ORAL | 3 refills | Status: DC

## 2018-07-09 NOTE — Patient Instructions (Signed)

## 2018-07-09 NOTE — Telephone Encounter (Signed)
-----   Message from [name], [place] sent at 07/09/2018  9:10 AM EST ----- Can we please call patient and tell her GI doctor gave OK to resume her cholesterol medication and I have sent this in for her to take once nightly.

## 2018-07-09 NOTE — Telephone Encounter (Signed)
[id number] 

## 2018-07-09 NOTE — Telephone Encounter (Signed)
Patient advised as directed below. 

## 2018-07-16 ENCOUNTER — Ambulatory Visit
Admission: RE | Admit: 2018-07-16 | Discharge: 2018-07-16 | Disposition: A | Discharge status: Home / Self Care | Payer: Medicaid Other | Source: Ambulatory Visit | Attending: Neurological Surgery | Admitting: Neurological Surgery

## 2018-07-16 DIAGNOSIS — M545 Low back pain: Secondary | ICD-10-CM | POA: Diagnosis not present

## 2018-07-16 DIAGNOSIS — M5416 Radiculopathy, lumbar region: Secondary | ICD-10-CM

## 2018-07-16 MED ORDER — IOPAMIDOL (ISOVUE-M 200) INJECTION 41%
1.0000 mL | Freq: Once | INTRAMUSCULAR | Status: AC
  Administered 2018-07-16: 1 mL via EPIDURAL

## 2018-07-16 MED ORDER — METHYLPREDNISOLONE ACETATE 40 MG/ML INJ SUSP (RADIOLOG
120.0000 mg | Freq: Once | INTRAMUSCULAR | Status: AC
  Administered 2018-07-16: 120 mg via EPIDURAL

## 2018-07-16 NOTE — Discharge Instructions (Signed)

## 2018-07-17 ENCOUNTER — Encounter: Payer: Self-pay | Admitting: Internal Medicine

## 2018-07-17 ENCOUNTER — Ambulatory Visit (INDEPENDENT_AMBULATORY_CARE_PROVIDER_SITE_OTHER): Payer: Medicaid Other | Admitting: Internal Medicine

## 2018-07-17 VITALS — BP 116/80 | HR 88 | Resp 16 | Ht 64.0 in | Wt 181.0 lb

## 2018-07-17 DIAGNOSIS — J440 Chronic obstructive pulmonary disease with acute lower respiratory infection: Secondary | ICD-10-CM | POA: Diagnosis not present

## 2018-07-17 DIAGNOSIS — J209 Acute bronchitis, unspecified: Secondary | ICD-10-CM | POA: Diagnosis not present

## 2018-07-17 DIAGNOSIS — F17210 Nicotine dependence, cigarettes, uncomplicated: Secondary | ICD-10-CM | POA: Diagnosis not present

## 2018-07-17 MED ORDER — IPRATROPIUM-ALBUTEROL 0.5-2.5 (3) MG/3ML IN SOLN: 3.0000 mL | Freq: Four times a day (QID) | RESPIRATORY_TRACT | 5 refills | Status: DC | PRN

## 2018-07-17 NOTE — Addendum Note (Signed)
Addended by: Garnette Gunner K on: 07/17/2018 12:00 PM   Modules accepted: Orders

## 2018-07-17 NOTE — Patient Instructions (Addendum)
Take robitussin-DM (or mucinex-DM) three times daily.  Will give prescription for nebulizer medication to use as needed.

## 2018-07-17 NOTE — Progress Notes (Addendum)
[facility] Consultation      Assessment and Plan:  Acute bronchitis with acute cough. - Patient had a acute cough, which came on after URTI. - Discussed that this cough may take up to 3 months to resolve, in the meantime we will treated symptomatically. -She is asked to use Robitussin-DM 3 times daily, I offered to give her a prescription for Tessalon, but she tells me that this is too expensive and is not covered by her insurance. - She has a nebulizer at home without medications, I given her prescription for duo nebs to be used as needed, as often as every 4 hours.  Chronic bronchitis. - Likely secondary to smoking. - Continue Spiriva, Symbicort, albuterol. - We will check a lung function test in 3 months.  Nicotine abuse. - Smokes 3 packs/day, currently down to 1 pack/day. - Discussed the importance workstation, spent 3 minutes in discussion, she is not currently serious about quitting.  Obstructive sleep apnea. - Discussed the importance of using CPAP nightly.  Date: 07/17/2018  MRN# [id number] Sara Munoz 17-Jan-1966   Sara Munoz is a 53 y.o. old female seen in consultation for chief complaint of:    Chief Complaint  Patient presents with  . Consult    Referred by Jeanell Sparrow eval for chronic bronchitis.  . Cough    mostly non productive  . Wheezing    qhs  . Shortness of Breath    with and without exertion.    HPI:  She has been having symptoms of chronic bronchitis, it has been present for about a month, it started as a cold, and the cold went away but the cough stayed.  This has happened a few times last year. She is smoking about 1 ppd, down from 3 ppd, decreased about 3 weeks. She got patches but has not started them yet.  She has 3 dogs, they all sleep in bed with her.  She has reflux, she takes omeprazole which controls it.  Thus far, she has tried mucinex, theraflu, cough drops, they did not help.  She takes spiriva daily,  symbicort 2 puffs twice daily, proair now every day. She has a neb machine, but she does not have the medicine. She was using it 2-3 times per day.    **Chest x-ray 06/13/2018>> imaging personally reviewed.  Mildly elevated right diaphragm, with some changes of chronic bronchitis.  Lungs are otherwise unremarkable.   PMHX:   Past Medical History:  Diagnosis Date  . Anxiety   . Asthma   . Atypical chest pain 08/02/2015  . CAD (coronary artery disease)   . Chronic back pain   . COPD (chronic obstructive pulmonary disease) ([place])   . Coronary artery disease    a. Mild-non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/2014  . GERD (gastroesophageal reflux disease)   . Hypercholesteremia   . Hypertension   . Hypokalemia   . Hypomagnesemia 01/04/2014  . Liver disease   . MI (myocardial infarction) ([place])   . Opiate use 02/27/2016  . Osteoarthritis   . Stroke [facility])    Surgical Hx:  Past Surgical History:  Procedure Laterality Date  . ANTERIOR CERVICAL DECOMP/DISCECTOMY FUSION N/A 03/30/2018   Procedure: Cervical six-seven Anterior cervical decompression/discectomy/fusion;  Surgeon: Judith Part, MD;  Location: [facility];  Service: Neurosurgery;  Laterality: N/A;  . BACK SURGERY    . CARDIAC CATHETERIZATION Left 02/22/2016   Procedure: Left Heart Cath and Coronary Angiography;  Surgeon: Yolonda Kida, MD;  Location: [facility];  Service: Cardiovascular;  Laterality: Left;  . COLONOSCOPY WITH PROPOFOL N/A 09/19/2016   Procedure: COLONOSCOPY WITH PROPOFOL;  Surgeon: [name], MD;  Location: [facility] ENDOSCOPY;  Service: Endoscopy;  Laterality: N/A;  . ESOPHAGOGASTRODUODENOSCOPY (EGD) WITH PROPOFOL N/A 09/18/2017   Procedure: ESOPHAGOGASTRODUODENOSCOPY (EGD) WITH PROPOFOL;  Surgeon: [name], MD;  Location: [facility] ENDOSCOPY;  Service: Gastroenterology;  Laterality: N/A;  . hemorroids    . NASAL SINUS SURGERY    . ORIF FEMUR FRACTURE    . ORIF TIBIA & FIBULA FRACTURES    . OVARY SURGERY      Family Hx:  Family History  Problem Relation Age of Onset  . Heart disease Mother   . Lung cancer Mother   . Ovarian cancer Mother   . Healthy Brother   . Healthy Brother   . Healthy Brother   . Diabetes Maternal Uncle   . Breast cancer Maternal Aunt    Social Hx:   Social History   Tobacco Use  . Smoking status: Current Every Day Smoker    Packs/day: 0.50    Types: Cigarettes  . Smokeless tobacco: Never Used  Substance Use Topics  . Alcohol use: Yes    Alcohol/week: 0.0 standard drinks  . Drug use: Yes    Types: Marijuana    Comment: 2 months ago    Medication:    Current Outpatient Medications:  .  albuterol (PROVENTIL HFA;VENTOLIN HFA) 108 (90 Base) MCG/ACT inhaler, Inhale 2 puffs into the lungs every 6 (six) hours as needed for wheezing or shortness of breath., Disp: 1 Inhaler, Rfl: 2 .  atorvastatin (LIPITOR) 20 MG tablet, Take 1 tablet (20 mg total) by mouth daily., Disp: 90 tablet, Rfl: 3 .  budesonide-formoterol (SYMBICORT) 160-4.5 MCG/ACT inhaler, Inhale 2 puffs into the lungs 2 (two) times daily., Disp: 1 Inhaler, Rfl: 0 .  docusate sodium (COLACE) 100 MG capsule, Take 200 mg by mouth 2 (two) times daily as needed for mild constipation., Disp: , Rfl:  .  DULoxetine (CYMBALTA) 60 MG capsule, Take 1 capsule (60 mg total) by mouth daily., Disp: 30 capsule, Rfl: 12 .  gabapentin (NEURONTIN) 600 MG tablet, Take 1 tablet (600 mg total) by mouth 3 (three) times daily., Disp: 90 tablet, Rfl: 2 .  hydrochlorothiazide (HYDRODIURIL) 25 MG tablet, Take 1 tablet (25 mg total) by mouth daily., Disp: 90 tablet, Rfl: 0 .  lisinopril (PRINIVIL,ZESTRIL) 40 MG tablet, Take 1 tablet (40 mg total) by mouth daily., Disp: 90 tablet, Rfl: 1 .  metoprolol tartrate (LOPRESSOR) 25 MG tablet, TAKE 1 TABLET BY MOUTH TWICE A DAY (Patient taking differently: Take 25 mg by mouth 2 (two) times daily. ), Disp: 180 tablet, Rfl: 1 .  Naloxone HCl (NARCAN IJ), Inject as directed as needed., Disp: ,  Rfl:  .  nitroGLYCERIN (NITROSTAT) 0.4 MG SL tablet, Place 1 tablet (0.4 mg total) under the tongue every 5 (five) minutes x 3 doses as needed for chest pain., Disp: 30 tablet, Rfl: 2 .  omeprazole (PRILOSEC) 40 MG capsule, Take 1 capsule (40 mg total) by mouth daily., Disp: 90 capsule, Rfl: 3 .  ondansetron (ZOFRAN-ODT) 8 MG disintegrating tablet, Take 1 tablet (8 mg total) by mouth 2 (two) times daily., Disp: 20 tablet, Rfl: 6 .  Oxycodone HCl 10 MG TABS, Take 10 mg by mouth 3 (three) times daily., Disp: , Rfl:  .  rizatriptan (MAXALT-MLT) 10 MG disintegrating tablet, Take 1 tablet (10 mg total) by mouth as  needed. May repeat in 2 hours if needed, Disp: 15 tablet, Rfl: 6 .  sucralfate (CARAFATE) 1 GM/10ML suspension, Take 10 mLs (1 g total) by mouth 4 (four) times daily., Disp: 420 mL, Rfl: 1 .  Tiotropium Bromide Monohydrate (SPIRIVA RESPIMAT) 2.5 MCG/ACT AERS, Inhale 1 puff into the lungs daily., Disp: 4 g, Rfl: 2 .  tiZANidine (ZANAFLEX) 4 MG capsule, Take 4 mg by mouth daily. , Disp: , Rfl:    Allergies:  Flexeril [cyclobenzaprine]; Levofloxacin; Keflex [cephalexin]; Ketorolac; Phenergan [promethazine hcl]; Toradol [ketorolac tromethamine]; Tramadol; and Zoloft [sertraline hcl]  Review of Systems: Gen:  Denies  fever, sweats, chills HEENT: Denies blurred vision, double vision. bleeds, sore throat Cvc:  No dizziness, chest pain. Resp:   Denies cough or sputum production, shortness of breath Gi: Denies swallowing difficulty, stomach pain. Gu:  Denies bladder incontinence, burning urine Ext:   No Joint pain, stiffness. Skin: No skin rash,  hives  Endoc:  No polyuria, polydipsia. Psych: No depression, insomnia. Other:  All other systems were reviewed with the patient and were negative other that what is mentioned in the HPI.   Physical Examination:   VS: BP 116/80 (BP Location: Left Arm, Cuff Size: Normal)   Pulse 88   Resp 16   Ht 5\' 4"  (1.626 m)   Wt 181 lb (82.1 kg)   SpO2 98%    BMI 31.07 kg/m   General Appearance: No distress  Neuro:without focal findings,  speech normal,  HEENT: PERRLA, EOM intact.   Pulmonary: normal breath sounds, No wheezing.  CardiovascularNormal S1,S2.  No m/r/g.   Abdomen: Benign, Soft, non-tender. Renal:  No costovertebral tenderness  GU:  No performed at this time. Endoc: No evident thyromegaly, no signs of acromegaly. Skin:   warm, no rashes, no ecchymosis  Extremities: normal, no cyanosis, clubbing.  Other findings:    LABORATORY PANEL:   CBC No results for input(s): WBC, HGB, HCT, PLT in the last 168 hours. ------------------------------------------------------------------------------------------------------------------  Chemistries  No results for input(s): NA, K, CL, CO2, GLUCOSE, BUN, CREATININE, CALCIUM, MG, AST, ALT, ALKPHOS, BILITOT in the last 168 hours.  Invalid input(s): GFRCGP ------------------------------------------------------------------------------------------------------------------  Cardiac Enzymes No results for input(s): TROPONINI in the last 168 hours. ------------------------------------------------------------  RADIOLOGY:  Dg Inject Diag/thera/inc Needle/cath/plc Epi/lumb/sac W/img  Result Date: 07/16/2018 CLINICAL DATA:  Spondylosis without myelopathy. Discogenic edema L5-S1. Mechanical low back pain. FLUOROSCOPY TIME:  0 minutes 20 seconds. 20.96 micro gray meter squared PROCEDURE: The procedure, risks, benefits, and alternatives were explained to the patient. Questions regarding the procedure were encouraged and answered. The patient understands and consents to the procedure. LUMBAR EPIDURAL INJECTION: An interlaminar approach was performed on right at L5-S1. The overlying skin was cleansed and anesthetized. A 20 gauge epidural needle was advanced using loss-of-resistance technique. DIAGNOSTIC EPIDURAL INJECTION: Injection of Isovue-M 200 shows a good epidural pattern with spread above and below  the level of needle placement, spreading to both sides. No vascular opacification is seen. THERAPEUTIC EPIDURAL INJECTION: One hundred twenty mg of Depo-Medrol mixed with 2.5 cc 1% lidocaine were instilled. The procedure was well-tolerated, and the patient was discharged thirty minutes following the injection in good condition. COMPLICATIONS: None IMPRESSION: Technically successful epidural injection on the right at L5-S1 # 1. Electronically Signed   By: Nelson Chimes M.D.   On: 07/16/2018 11:56       Thank  you for the consultation and for allowing [facility] to assist in the care of your patient. Our  recommendations are noted above.  Please contact us if we can be of further service.   Marda Stalker, M.D., F.C.C.P.  Board Certified in Internal Medicine, Pulmonary Medicine, [place], and Sleep Medicine.  [facility] Office Number: [phone]   07/17/2018

## 2018-07-20 DIAGNOSIS — J449 Chronic obstructive pulmonary disease, unspecified: Secondary | ICD-10-CM | POA: Diagnosis not present

## 2018-07-21 ENCOUNTER — Other Ambulatory Visit: Payer: Self-pay | Admitting: Gastroenterology

## 2018-07-21 DIAGNOSIS — M79622 Pain in left upper arm: Secondary | ICD-10-CM | POA: Diagnosis not present

## 2018-07-21 DIAGNOSIS — M79662 Pain in left lower leg: Secondary | ICD-10-CM | POA: Diagnosis not present

## 2018-07-21 DIAGNOSIS — G894 Chronic pain syndrome: Secondary | ICD-10-CM | POA: Diagnosis not present

## 2018-07-21 DIAGNOSIS — M79621 Pain in right upper arm: Secondary | ICD-10-CM | POA: Diagnosis not present

## 2018-07-28 ENCOUNTER — Ambulatory Visit: Payer: Medicaid Other

## 2018-08-03 ENCOUNTER — Telehealth: Payer: Self-pay

## 2018-08-03 NOTE — Telephone Encounter (Signed)
Patient scheduled for Wednesday February the 18th.

## 2018-08-03 NOTE — Telephone Encounter (Signed)
Called patient and LM that we have Hep B vaccine for second dose.

## 2018-08-05 ENCOUNTER — Ambulatory Visit: Payer: Self-pay | Admitting: Gastroenterology

## 2018-08-07 DIAGNOSIS — M5416 Radiculopathy, lumbar region: Secondary | ICD-10-CM | POA: Diagnosis not present

## 2018-08-07 DIAGNOSIS — M5412 Radiculopathy, cervical region: Secondary | ICD-10-CM | POA: Diagnosis not present

## 2018-08-07 DIAGNOSIS — I10 Essential (primary) hypertension: Secondary | ICD-10-CM | POA: Diagnosis not present

## 2018-08-07 DIAGNOSIS — Z6831 Body mass index (BMI) 31.0-31.9, adult: Secondary | ICD-10-CM | POA: Diagnosis not present

## 2018-08-10 ENCOUNTER — Encounter: Payer: Self-pay | Admitting: Gastroenterology

## 2018-08-10 ENCOUNTER — Ambulatory Visit: Payer: Self-pay | Admitting: Gastroenterology

## 2018-08-10 DIAGNOSIS — R197 Diarrhea, unspecified: Secondary | ICD-10-CM

## 2018-08-10 NOTE — Progress Notes (Deleted)
Summary of history : Sara R Dunlapwas initially referred back in 05/2016 for hepatitis CGT 3.Completed treatment in 08/2017( was cured).Autoimmune screen,HIV, Hep B serology -negative in 08/2016   CT abdomen 03/17/17- increased stool burden and hepatic steatosis. Colonoscopy 08/2016 - small adenomas excised  RUQ USG 05/07/16 showed hepatic steatosis.   09/18/17 : EGD: normal appearance but bx showed mild active gastritis and negative for H pylori .  LFT's normal in 01/2018 02/25/18: H pylori breath test negative  Interval history 05/19/18 -08/10/2018  05/2018: CT abdomen - no acute findings  Did not obtain stool tests for diarrhea.  Not immune to hep B needs vaccine. Immune to hep A  Has been having diarrhea last 3 weeks, was constipated previously. She has 5-6 bowel movements a day . Some gas. Denies any NSAID's . Still on colace.   Has abdominal pain rt side of abdomen ongoing since 01/2018 . Almost stopped smoking   Sara Munoz is a 53 y.o. y/o female here to follow upfor abdominal pain: presently persists and has diarrhea: previously had constipation    Plan  1.Needs Hep B vaccine  2. Stool tests to rule out infection  3. If above is negative will treat for IBS-D with xifaxan.

## 2018-08-11 ENCOUNTER — Ambulatory Visit: Payer: Self-pay

## 2018-08-16 ENCOUNTER — Other Ambulatory Visit: Payer: Self-pay | Admitting: Physician Assistant

## 2018-08-16 DIAGNOSIS — I10 Essential (primary) hypertension: Secondary | ICD-10-CM

## 2018-08-21 DIAGNOSIS — M79662 Pain in left lower leg: Secondary | ICD-10-CM | POA: Diagnosis not present

## 2018-08-21 DIAGNOSIS — M79621 Pain in right upper arm: Secondary | ICD-10-CM | POA: Diagnosis not present

## 2018-08-21 DIAGNOSIS — M79622 Pain in left upper arm: Secondary | ICD-10-CM | POA: Diagnosis not present

## 2018-08-21 DIAGNOSIS — G894 Chronic pain syndrome: Secondary | ICD-10-CM | POA: Diagnosis not present

## 2018-09-17 DIAGNOSIS — M79621 Pain in right upper arm: Secondary | ICD-10-CM | POA: Diagnosis not present

## 2018-09-17 DIAGNOSIS — M79662 Pain in left lower leg: Secondary | ICD-10-CM | POA: Diagnosis not present

## 2018-09-17 DIAGNOSIS — G894 Chronic pain syndrome: Secondary | ICD-10-CM | POA: Diagnosis not present

## 2018-09-17 DIAGNOSIS — M79622 Pain in left upper arm: Secondary | ICD-10-CM | POA: Diagnosis not present

## 2018-09-18 ENCOUNTER — Other Ambulatory Visit: Payer: Self-pay | Admitting: Physician Assistant

## 2018-09-18 ENCOUNTER — Other Ambulatory Visit: Payer: Self-pay | Admitting: Neurology

## 2018-09-18 DIAGNOSIS — J432 Centrilobular emphysema: Secondary | ICD-10-CM

## 2018-09-21 ENCOUNTER — Ambulatory Visit: Payer: Self-pay | Admitting: Physician Assistant

## 2018-09-21 NOTE — Telephone Encounter (Signed)
Please advise 

## 2018-09-22 ENCOUNTER — Telehealth: Payer: Self-pay

## 2018-09-22 NOTE — Telephone Encounter (Signed)
Prior Authorization Approval [id number] Valid 09/22/2018-09/17/2019 Called [facility] and gave prior authorization information.

## 2018-10-07 DIAGNOSIS — M5412 Radiculopathy, cervical region: Secondary | ICD-10-CM | POA: Diagnosis not present

## 2018-10-08 ENCOUNTER — Ambulatory Visit: Payer: Medicaid Other

## 2018-10-09 ENCOUNTER — Other Ambulatory Visit: Payer: Self-pay | Admitting: Neurological Surgery

## 2018-10-09 DIAGNOSIS — M5412 Radiculopathy, cervical region: Secondary | ICD-10-CM

## 2018-10-15 DIAGNOSIS — M79622 Pain in left upper arm: Secondary | ICD-10-CM | POA: Diagnosis not present

## 2018-10-15 DIAGNOSIS — M79621 Pain in right upper arm: Secondary | ICD-10-CM | POA: Diagnosis not present

## 2018-10-15 DIAGNOSIS — M79661 Pain in right lower leg: Secondary | ICD-10-CM | POA: Diagnosis not present

## 2018-10-15 DIAGNOSIS — G894 Chronic pain syndrome: Secondary | ICD-10-CM | POA: Diagnosis not present

## 2018-10-19 ENCOUNTER — Other Ambulatory Visit: Payer: Self-pay

## 2018-10-19 ENCOUNTER — Inpatient Hospital Stay
Admission: EM | Admit: 2018-10-19 | Discharge: 2018-10-22 | DRG: 871 | Disposition: A | Discharge status: Home / Self Care | Payer: Medicaid Other | Attending: Internal Medicine | Admitting: Internal Medicine

## 2018-10-19 ENCOUNTER — Emergency Department: Payer: Medicaid Other

## 2018-10-19 ENCOUNTER — Encounter: Payer: Self-pay | Admitting: Emergency Medicine

## 2018-10-19 ENCOUNTER — Inpatient Hospital Stay (HOSPITAL_COMMUNITY)
Admit: 2018-10-19 | Discharge: 2018-10-19 | Disposition: A | Discharge status: Home / Self Care | Payer: Medicaid Other | Attending: Internal Medicine | Admitting: Internal Medicine

## 2018-10-19 DIAGNOSIS — J9601 Acute respiratory failure with hypoxia: Secondary | ICD-10-CM | POA: Diagnosis not present

## 2018-10-19 DIAGNOSIS — M1990 Unspecified osteoarthritis, unspecified site: Secondary | ICD-10-CM | POA: Diagnosis present

## 2018-10-19 DIAGNOSIS — E7800 Pure hypercholesterolemia, unspecified: Secondary | ICD-10-CM | POA: Diagnosis present

## 2018-10-19 DIAGNOSIS — Z801 Family history of malignant neoplasm of trachea, bronchus and lung: Secondary | ICD-10-CM

## 2018-10-19 DIAGNOSIS — R0602 Shortness of breath: Secondary | ICD-10-CM | POA: Diagnosis not present

## 2018-10-19 DIAGNOSIS — R0902 Hypoxemia: Secondary | ICD-10-CM

## 2018-10-19 DIAGNOSIS — J449 Chronic obstructive pulmonary disease, unspecified: Secondary | ICD-10-CM | POA: Diagnosis not present

## 2018-10-19 DIAGNOSIS — N390 Urinary tract infection, site not specified: Secondary | ICD-10-CM

## 2018-10-19 DIAGNOSIS — G4733 Obstructive sleep apnea (adult) (pediatric): Secondary | ICD-10-CM | POA: Diagnosis present

## 2018-10-19 DIAGNOSIS — Z8673 Personal history of transient ischemic attack (TIA), and cerebral infarction without residual deficits: Secondary | ICD-10-CM | POA: Diagnosis not present

## 2018-10-19 DIAGNOSIS — R1084 Generalized abdominal pain: Secondary | ICD-10-CM | POA: Diagnosis not present

## 2018-10-19 DIAGNOSIS — E876 Hypokalemia: Secondary | ICD-10-CM | POA: Diagnosis present

## 2018-10-19 DIAGNOSIS — F17210 Nicotine dependence, cigarettes, uncomplicated: Secondary | ICD-10-CM | POA: Diagnosis present

## 2018-10-19 DIAGNOSIS — G894 Chronic pain syndrome: Secondary | ICD-10-CM | POA: Diagnosis present

## 2018-10-19 DIAGNOSIS — I248 Other forms of acute ischemic heart disease: Secondary | ICD-10-CM | POA: Diagnosis present

## 2018-10-19 DIAGNOSIS — Z20828 Contact with and (suspected) exposure to other viral communicable diseases: Secondary | ICD-10-CM | POA: Diagnosis present

## 2018-10-19 DIAGNOSIS — Z7951 Long term (current) use of inhaled steroids: Secondary | ICD-10-CM

## 2018-10-19 DIAGNOSIS — N179 Acute kidney failure, unspecified: Secondary | ICD-10-CM | POA: Diagnosis not present

## 2018-10-19 DIAGNOSIS — E785 Hyperlipidemia, unspecified: Secondary | ICD-10-CM | POA: Diagnosis present

## 2018-10-19 DIAGNOSIS — N10 Acute tubulo-interstitial nephritis: Secondary | ICD-10-CM | POA: Diagnosis present

## 2018-10-19 DIAGNOSIS — J209 Acute bronchitis, unspecified: Secondary | ICD-10-CM | POA: Diagnosis present

## 2018-10-19 DIAGNOSIS — I110 Hypertensive heart disease with heart failure: Secondary | ICD-10-CM | POA: Diagnosis present

## 2018-10-19 DIAGNOSIS — I739 Peripheral vascular disease, unspecified: Secondary | ICD-10-CM | POA: Diagnosis present

## 2018-10-19 DIAGNOSIS — R000 Tachycardia, unspecified: Secondary | ICD-10-CM | POA: Diagnosis not present

## 2018-10-19 DIAGNOSIS — A419 Sepsis, unspecified organism: Secondary | ICD-10-CM

## 2018-10-19 DIAGNOSIS — A4151 Sepsis due to Escherichia coli [E. coli]: Principal | ICD-10-CM | POA: Diagnosis present

## 2018-10-19 DIAGNOSIS — Z803 Family history of malignant neoplasm of breast: Secondary | ICD-10-CM

## 2018-10-19 DIAGNOSIS — Z888 Allergy status to other drugs, medicaments and biological substances status: Secondary | ICD-10-CM

## 2018-10-19 DIAGNOSIS — J440 Chronic obstructive pulmonary disease with acute lower respiratory infection: Secondary | ICD-10-CM | POA: Diagnosis present

## 2018-10-19 DIAGNOSIS — N12 Tubulo-interstitial nephritis, not specified as acute or chronic: Secondary | ICD-10-CM | POA: Diagnosis not present

## 2018-10-19 DIAGNOSIS — R6520 Severe sepsis without septic shock: Secondary | ICD-10-CM

## 2018-10-19 DIAGNOSIS — Z79891 Long term (current) use of opiate analgesic: Secondary | ICD-10-CM

## 2018-10-19 DIAGNOSIS — R6521 Severe sepsis with septic shock: Secondary | ICD-10-CM | POA: Diagnosis present

## 2018-10-19 DIAGNOSIS — I2510 Atherosclerotic heart disease of native coronary artery without angina pectoris: Secondary | ICD-10-CM | POA: Diagnosis present

## 2018-10-19 DIAGNOSIS — Z8249 Family history of ischemic heart disease and other diseases of the circulatory system: Secondary | ICD-10-CM

## 2018-10-19 DIAGNOSIS — R112 Nausea with vomiting, unspecified: Secondary | ICD-10-CM | POA: Diagnosis not present

## 2018-10-19 DIAGNOSIS — I959 Hypotension, unspecified: Secondary | ICD-10-CM | POA: Diagnosis not present

## 2018-10-19 DIAGNOSIS — I509 Heart failure, unspecified: Secondary | ICD-10-CM | POA: Diagnosis present

## 2018-10-19 DIAGNOSIS — I252 Old myocardial infarction: Secondary | ICD-10-CM | POA: Diagnosis not present

## 2018-10-19 DIAGNOSIS — F4310 Post-traumatic stress disorder, unspecified: Secondary | ICD-10-CM | POA: Diagnosis present

## 2018-10-19 DIAGNOSIS — Z885 Allergy status to narcotic agent status: Secondary | ICD-10-CM

## 2018-10-19 DIAGNOSIS — Z8041 Family history of malignant neoplasm of ovary: Secondary | ICD-10-CM

## 2018-10-19 DIAGNOSIS — K219 Gastro-esophageal reflux disease without esophagitis: Secondary | ICD-10-CM | POA: Diagnosis present

## 2018-10-19 DIAGNOSIS — R0689 Other abnormalities of breathing: Secondary | ICD-10-CM | POA: Diagnosis not present

## 2018-10-19 DIAGNOSIS — R778 Other specified abnormalities of plasma proteins: Secondary | ICD-10-CM

## 2018-10-19 DIAGNOSIS — R7989 Other specified abnormal findings of blood chemistry: Principal | ICD-10-CM

## 2018-10-19 DIAGNOSIS — Z881 Allergy status to other antibiotic agents status: Secondary | ICD-10-CM

## 2018-10-19 DIAGNOSIS — Z833 Family history of diabetes mellitus: Secondary | ICD-10-CM

## 2018-10-19 LAB — CBC WITH DIFFERENTIAL/PLATELET
Abs Immature Granulocytes: 0.05 10*3/uL (ref 0.00–0.07)
Basophils Absolute: 0 10*3/uL (ref 0.0–0.1)
Basophils Relative: 1 %
Eosinophils Absolute: 0.1 10*3/uL (ref 0.0–0.5)
Eosinophils Relative: 3 %
HCT: 45.6 % (ref 36.0–46.0)
Hemoglobin: 14.9 g/dL (ref 12.0–15.0)
Immature Granulocytes: 1 %
Lymphocytes Relative: 7 %
Lymphs Abs: 0.4 10*3/uL — ABNORMAL LOW (ref 0.7–4.0)
MCH: 29 pg (ref 26.0–34.0)
MCHC: 32.7 g/dL (ref 30.0–36.0)
MCV: 88.7 fL (ref 80.0–100.0)
Monocytes Absolute: 0.1 10*3/uL (ref 0.1–1.0)
Monocytes Relative: 2 %
Neutro Abs: 4.9 10*3/uL (ref 1.7–7.7)
Neutrophils Relative %: 86 %
Platelets: 123 10*3/uL — ABNORMAL LOW (ref 150–400)
RBC: 5.14 MIL/uL — ABNORMAL HIGH (ref 3.87–5.11)
RDW: 18.2 % — ABNORMAL HIGH (ref 11.5–15.5)
WBC: 5.6 10*3/uL (ref 4.0–10.5)
nRBC: 0.9 % — ABNORMAL HIGH (ref 0.0–0.2)

## 2018-10-19 LAB — URINALYSIS, ROUTINE W REFLEX MICROSCOPIC
Bilirubin Urine: NEGATIVE
Glucose, UA: NEGATIVE mg/dL
Ketones, ur: NEGATIVE mg/dL
Nitrite: NEGATIVE
Protein, ur: 100 mg/dL — AB
Specific Gravity, Urine: 1.012 (ref 1.005–1.030)
WBC, UA: >50 WBC/hpf — ABNORMAL HIGH (ref 0–5)
pH: 5 (ref 5.0–8.0)

## 2018-10-19 LAB — CBC
HCT: 33.9 % — ABNORMAL LOW (ref 36.0–46.0)
Hemoglobin: 11.2 g/dL — ABNORMAL LOW (ref 12.0–15.0)
MCH: 29.6 pg (ref 26.0–34.0)
MCHC: 33 g/dL (ref 30.0–36.0)
MCV: 89.7 fL (ref 80.0–100.0)
Platelets: 100 10*3/uL — ABNORMAL LOW (ref 150–400)
RBC: 3.78 MIL/uL — ABNORMAL LOW (ref 3.87–5.11)
RDW: 18.2 % — ABNORMAL HIGH (ref 11.5–15.5)
WBC: 10.9 10*3/uL — ABNORMAL HIGH (ref 4.0–10.5)
nRBC: 0.5 % — ABNORMAL HIGH (ref 0.0–0.2)

## 2018-10-19 LAB — BASIC METABOLIC PANEL
Anion gap: 8 (ref 5–15)
BUN: 20 mg/dL (ref 6–20)
CO2: 20 mmol/L — ABNORMAL LOW (ref 22–32)
Calcium: 7.2 mg/dL — ABNORMAL LOW (ref 8.9–10.3)
Chloride: 110 mmol/L (ref 98–111)
Creatinine, Ser: 1.19 mg/dL — ABNORMAL HIGH (ref 0.44–1.00)
GFR calc Af Amer: >60 mL/min (ref >60)
GFR calc non Af Amer: 53 mL/min — ABNORMAL LOW (ref >60)
Glucose, Bld: 109 mg/dL — ABNORMAL HIGH (ref 70–99)
Potassium: 3.5 mmol/L (ref 3.5–5.1)
Sodium: 138 mmol/L (ref 135–145)

## 2018-10-19 LAB — TROPONIN I
Troponin I: 0.06 ng/mL (ref <0.03)
Troponin I: 0.11 ng/mL (ref <0.03)
Troponin I: 0.12 ng/mL (ref <0.03)

## 2018-10-19 LAB — PHOSPHORUS: Phosphorus: 3.3 mg/dL (ref 2.5–4.6)

## 2018-10-19 LAB — COMPREHENSIVE METABOLIC PANEL
ALT: 21 U/L (ref 0–44)
AST: 37 U/L (ref 15–41)
Albumin: 3.7 g/dL (ref 3.5–5.0)
Alkaline Phosphatase: 113 U/L (ref 38–126)
Anion gap: 16 — ABNORMAL HIGH (ref 5–15)
BUN: 20 mg/dL (ref 6–20)
CO2: 22 mmol/L (ref 22–32)
Calcium: 8.7 mg/dL — ABNORMAL LOW (ref 8.9–10.3)
Chloride: 99 mmol/L (ref 98–111)
Creatinine, Ser: 1.52 mg/dL — ABNORMAL HIGH (ref 0.44–1.00)
GFR calc Af Amer: 46 mL/min — ABNORMAL LOW (ref >60)
GFR calc non Af Amer: 40 mL/min — ABNORMAL LOW (ref >60)
Glucose, Bld: 108 mg/dL — ABNORMAL HIGH (ref 70–99)
Potassium: 2.8 mmol/L — ABNORMAL LOW (ref 3.5–5.1)
Sodium: 137 mmol/L (ref 135–145)
Total Bilirubin: 1.1 mg/dL (ref 0.3–1.2)
Total Protein: 7.4 g/dL (ref 6.5–8.1)

## 2018-10-19 LAB — MAGNESIUM
Magnesium: 1.2 mg/dL — ABNORMAL LOW (ref 1.7–2.4)
Magnesium: 1.2 mg/dL — ABNORMAL LOW (ref 1.7–2.4)

## 2018-10-19 LAB — CORTISOL-AM, BLOOD: Cortisol - AM: 23 ug/dL — ABNORMAL HIGH (ref 6.7–22.6)

## 2018-10-19 LAB — PREGNANCY, URINE: Preg Test, Ur: NEGATIVE

## 2018-10-19 LAB — PROTIME-INR
INR: 1.2 (ref 0.8–1.2)
Prothrombin Time: 14.8 seconds (ref 11.4–15.2)

## 2018-10-19 LAB — PROCALCITONIN: Procalcitonin: 19.56 ng/mL

## 2018-10-19 LAB — LACTIC ACID, PLASMA
Lactic Acid, Venous: 3.3 mmol/L (ref 0.5–1.9)
Lactic Acid, Venous: 2.2 mmol/L (ref 0.5–1.9)

## 2018-10-19 LAB — GLUCOSE, CAPILLARY: Glucose-Capillary: 108 mg/dL — ABNORMAL HIGH (ref 70–99)

## 2018-10-19 LAB — ECHOCARDIOGRAM COMPLETE
Height: 64 in
Weight: 3097.02 oz

## 2018-10-19 LAB — SARS CORONAVIRUS 2 BY RT PCR (HOSPITAL ORDER, PERFORMED IN ~~LOC~~ HOSPITAL LAB): SARS Coronavirus 2: NEGATIVE

## 2018-10-19 LAB — C DIFFICILE QUICK SCREEN W PCR REFLEX
C Diff antigen: NEGATIVE
C Diff interpretation: NOT DETECTED
C Diff toxin: NEGATIVE

## 2018-10-19 LAB — MRSA PCR SCREENING: MRSA by PCR: NEGATIVE

## 2018-10-19 MED ORDER — MOMETASONE FURO-FORMOTEROL FUM 200-5 MCG/ACT IN AERO
2.0000 | INHALATION_SPRAY | Freq: Two times a day (BID) | RESPIRATORY_TRACT | Status: DC
  Administered 2018-10-19 – 2018-10-22 (×7): 2 via RESPIRATORY_TRACT
  Filled 2018-10-19 (×2): qty 8.8

## 2018-10-19 MED ORDER — SODIUM CHLORIDE 0.9 % IV SOLN
2.0000 g | Freq: Three times a day (TID) | INTRAVENOUS | Status: DC
  Administered 2018-10-19: 2 g via INTRAVENOUS
  Filled 2018-10-19 (×3): qty 2

## 2018-10-19 MED ORDER — RIZATRIPTAN BENZOATE 10 MG PO TBDP: 10.0000 mg | ORAL_TABLET | ORAL | Status: DC | PRN

## 2018-10-19 MED ORDER — ONDANSETRON HCL 4 MG/2ML IJ SOLN
4.0000 mg | Freq: Four times a day (QID) | INTRAMUSCULAR | Status: DC | PRN
  Administered 2018-10-19 – 2018-10-20 (×2): 4 mg via INTRAVENOUS
  Filled 2018-10-19 (×2): qty 2

## 2018-10-19 MED ORDER — VANCOMYCIN HCL IN DEXTROSE 1-5 GM/200ML-% IV SOLN: 1000.0000 mg | Freq: Once | INTRAVENOUS | Status: DC

## 2018-10-19 MED ORDER — OXYCODONE-ACETAMINOPHEN 5-325 MG PO TABS
1.0000 | ORAL_TABLET | Freq: Four times a day (QID) | ORAL | Status: DC | PRN
  Administered 2018-10-19: 1 via ORAL
  Filled 2018-10-19: qty 1

## 2018-10-19 MED ORDER — POTASSIUM CHLORIDE 20 MEQ PO PACK
40.0000 meq | PACK | Freq: Once | ORAL | Status: AC
  Administered 2018-10-19: 40 meq via ORAL
  Filled 2018-10-19: qty 2

## 2018-10-19 MED ORDER — VANCOMYCIN HCL 500 MG IV SOLR
500.0000 mg | Freq: Once | INTRAVENOUS | Status: DC
  Filled 2018-10-19: qty 500

## 2018-10-19 MED ORDER — ENOXAPARIN SODIUM 40 MG/0.4ML ~~LOC~~ SOLN
40.0000 mg | SUBCUTANEOUS | Status: DC
  Administered 2018-10-19 – 2018-10-22 (×4): 40 mg via SUBCUTANEOUS
  Filled 2018-10-19 (×4): qty 0.4

## 2018-10-19 MED ORDER — SODIUM CHLORIDE 0.9 % IV BOLUS
1000.0000 mL | Freq: Once | INTRAVENOUS | Status: AC
  Administered 2018-10-19: 1000 mL via INTRAVENOUS

## 2018-10-19 MED ORDER — IOHEXOL 240 MG/ML SOLN
50.0000 mL | Freq: Once | INTRAMUSCULAR | Status: AC | PRN
  Administered 2018-10-19: 50 mL via ORAL

## 2018-10-19 MED ORDER — TIOTROPIUM BROMIDE MONOHYDRATE 18 MCG IN CAPS
1.0000 | ORAL_CAPSULE | Freq: Every morning | RESPIRATORY_TRACT | Status: DC
  Administered 2018-10-19 – 2018-10-22 (×3): 18 ug via RESPIRATORY_TRACT
  Filled 2018-10-19 (×2): qty 5

## 2018-10-19 MED ORDER — POTASSIUM CHLORIDE 20 MEQ PO PACK
40.0000 meq | PACK | Freq: Once | ORAL | Status: DC
  Filled 2018-10-19: qty 2

## 2018-10-19 MED ORDER — GABAPENTIN 300 MG PO CAPS
300.0000 mg | ORAL_CAPSULE | Freq: Three times a day (TID) | ORAL | Status: DC
  Administered 2018-10-19 – 2018-10-22 (×8): 300 mg via ORAL
  Filled 2018-10-19 (×8): qty 1

## 2018-10-19 MED ORDER — PERFLUTREN LIPID MICROSPHERE
1.0000 mL | INTRAVENOUS | Status: AC | PRN
  Administered 2018-10-19: 15:00:00 2 mL via INTRAVENOUS
  Filled 2018-10-19: qty 10

## 2018-10-19 MED ORDER — METRONIDAZOLE IN NACL 5-0.79 MG/ML-% IV SOLN
500.0000 mg | Freq: Three times a day (TID) | INTRAVENOUS | Status: DC
  Filled 2018-10-19 (×3): qty 100

## 2018-10-19 MED ORDER — VANCOMYCIN HCL IN DEXTROSE 750-5 MG/150ML-% IV SOLN: 750.0000 mg | INTRAVENOUS | Status: DC

## 2018-10-19 MED ORDER — MORPHINE SULFATE (PF) 4 MG/ML IV SOLN
4.0000 mg | Freq: Once | INTRAVENOUS | Status: AC
  Administered 2018-10-19: 4 mg via INTRAVENOUS

## 2018-10-19 MED ORDER — ONDANSETRON HCL 4 MG/2ML IJ SOLN
INTRAMUSCULAR | Status: AC
  Administered 2018-10-19: 4 mg via INTRAVENOUS
  Filled 2018-10-19: qty 2

## 2018-10-19 MED ORDER — DULOXETINE HCL 30 MG PO CPEP
60.0000 mg | ORAL_CAPSULE | Freq: Every day | ORAL | Status: DC
  Administered 2018-10-19 – 2018-10-22 (×4): 60 mg via ORAL
  Filled 2018-10-19 (×4): qty 2

## 2018-10-19 MED ORDER — SODIUM CHLORIDE 0.9 % IV SOLN
2.0000 g | Freq: Three times a day (TID) | INTRAVENOUS | Status: DC
  Administered 2018-10-19 – 2018-10-20 (×2): 2 g via INTRAVENOUS
  Filled 2018-10-19 (×3): qty 2

## 2018-10-19 MED ORDER — ATORVASTATIN CALCIUM 20 MG PO TABS
20.0000 mg | ORAL_TABLET | Freq: Every day | ORAL | Status: DC
  Administered 2018-10-19 – 2018-10-22 (×4): 20 mg via ORAL
  Filled 2018-10-19 (×4): qty 1

## 2018-10-19 MED ORDER — ONDANSETRON HCL 4 MG PO TABS
4.0000 mg | ORAL_TABLET | Freq: Four times a day (QID) | ORAL | Status: DC | PRN
  Administered 2018-10-21 (×2): 4 mg via ORAL
  Filled 2018-10-19 (×2): qty 1

## 2018-10-19 MED ORDER — VANCOMYCIN HCL IN DEXTROSE 750-5 MG/150ML-% IV SOLN
750.0000 mg | INTRAVENOUS | Status: DC
  Filled 2018-10-19: qty 150

## 2018-10-19 MED ORDER — IPRATROPIUM-ALBUTEROL 0.5-2.5 (3) MG/3ML IN SOLN
3.0000 mL | Freq: Four times a day (QID) | RESPIRATORY_TRACT | Status: DC
  Administered 2018-10-19 – 2018-10-20 (×4): 3 mL via RESPIRATORY_TRACT
  Filled 2018-10-19 (×4): qty 3

## 2018-10-19 MED ORDER — ACETAMINOPHEN 325 MG PO TABS
650.0000 mg | ORAL_TABLET | Freq: Four times a day (QID) | ORAL | Status: DC | PRN
  Administered 2018-10-19 – 2018-10-22 (×7): 650 mg via ORAL
  Filled 2018-10-19 (×8): qty 2

## 2018-10-19 MED ORDER — SODIUM CHLORIDE 0.9 % IV SOLN
2.0000 g | Freq: Once | INTRAVENOUS | Status: AC
  Administered 2018-10-19: 02:00:00 2 g via INTRAVENOUS
  Filled 2018-10-19: qty 2

## 2018-10-19 MED ORDER — MAGNESIUM SULFATE 4 GM/100ML IV SOLN
4.0000 g | Freq: Once | INTRAVENOUS | Status: AC
  Administered 2018-10-19: 4 g via INTRAVENOUS
  Filled 2018-10-19: qty 100

## 2018-10-19 MED ORDER — ACETAMINOPHEN 650 MG RE SUPP: 650.0000 mg | Freq: Four times a day (QID) | RECTAL | Status: DC | PRN

## 2018-10-19 MED ORDER — VANCOMYCIN HCL 10 G IV SOLR
1750.0000 mg | Freq: Once | INTRAVENOUS | Status: AC
  Administered 2018-10-19: 1750 mg via INTRAVENOUS
  Filled 2018-10-19: qty 1750

## 2018-10-19 MED ORDER — SUCRALFATE 1 GM/10ML PO SUSP
1.0000 g | Freq: Four times a day (QID) | ORAL | Status: DC
  Administered 2018-10-19 – 2018-10-22 (×13): 1 g via ORAL
  Filled 2018-10-19 (×16): qty 10

## 2018-10-19 MED ORDER — OXYCODONE HCL 5 MG PO TABS
10.0000 mg | ORAL_TABLET | ORAL | Status: DC | PRN
  Administered 2018-10-19 – 2018-10-22 (×13): 10 mg via ORAL
  Filled 2018-10-19 (×13): qty 2

## 2018-10-19 MED ORDER — SUMATRIPTAN SUCCINATE 25 MG PO TABS
50.0000 mg | ORAL_TABLET | Freq: Once | ORAL | Status: AC | PRN
  Administered 2018-10-20: 50 mg via ORAL
  Filled 2018-10-19: qty 2

## 2018-10-19 MED ORDER — METRONIDAZOLE IN NACL 5-0.79 MG/ML-% IV SOLN
500.0000 mg | Freq: Once | INTRAVENOUS | Status: AC
  Administered 2018-10-19: 500 mg via INTRAVENOUS
  Filled 2018-10-19: qty 100

## 2018-10-19 MED ORDER — SODIUM CHLORIDE 0.9 % IV SOLN
INTRAVENOUS | Status: DC
  Administered 2018-10-19 – 2018-10-21 (×5): via INTRAVENOUS

## 2018-10-19 MED ORDER — MORPHINE SULFATE (PF) 4 MG/ML IV SOLN
INTRAVENOUS | Status: AC
  Administered 2018-10-19: 4 mg via INTRAVENOUS
  Filled 2018-10-19: qty 1

## 2018-10-19 MED ORDER — NOREPINEPHRINE 4 MG/250ML-% IV SOLN
0.0000 ug/min | INTRAVENOUS | Status: DC
  Administered 2018-10-19: 1 ug/min via INTRAVENOUS
  Filled 2018-10-19: qty 250

## 2018-10-19 MED ORDER — PANTOPRAZOLE SODIUM 40 MG PO TBEC
40.0000 mg | DELAYED_RELEASE_TABLET | Freq: Every day | ORAL | Status: DC
  Administered 2018-10-19 – 2018-10-22 (×4): 40 mg via ORAL
  Filled 2018-10-19 (×4): qty 1

## 2018-10-19 MED ORDER — DOCUSATE SODIUM 100 MG PO CAPS
200.0000 mg | ORAL_CAPSULE | Freq: Two times a day (BID) | ORAL | Status: DC | PRN
  Administered 2018-10-22: 200 mg via ORAL
  Filled 2018-10-19 (×2): qty 2

## 2018-10-19 MED ORDER — IOHEXOL 300 MG/ML  SOLN
75.0000 mL | Freq: Once | INTRAMUSCULAR | Status: AC | PRN
  Administered 2018-10-19: 75 mL via INTRAVENOUS

## 2018-10-19 MED ORDER — ONDANSETRON HCL 4 MG/2ML IJ SOLN
4.0000 mg | Freq: Once | INTRAMUSCULAR | Status: AC
  Administered 2018-10-19: 4 mg via INTRAVENOUS

## 2018-10-19 NOTE — Progress Notes (Addendum)
PHARMACY CONSULT NOTE - FOLLOW UP  Pharmacy Consult for Electrolyte Monitoring and Replacement   Recent Labs: Potassium (mmol/L)  Date Value  10/19/2018 3.5   Magnesium (mg/dL)  Date Value  10/19/2018 1.2 (L)   Calcium (mg/dL)  Date Value  10/19/2018 7.2 (L)   Albumin (g/dL)  Date Value  10/19/2018 3.7  02/20/2018 4.3   Phosphorus (mg/dL)  Date Value  10/19/2018 3.3   Sodium (mmol/L)  Date Value  10/19/2018 138  02/20/2018 141     Assessment: Patient admitted for pyelonephritis.   Goal of Therapy:  Potassium ~4.0, Magnesium ~2.0   Plan:  Patient received KCl 40 mEq PO x 1 this AM. Will order KCl 40 mEq PO x 1 as lab was drawn AFTER other KCl given. Ordered Mg sulfate 4 g IV x 1. Will follow-up with AM labs.   Pharmacy will continue to monitor.  Paticia Stack, PharmD Pharmacy Resident  10/19/2018 3:22 PM

## 2018-10-19 NOTE — ED Notes (Addendum)
Family, Steffanie Dunn, called for update, phone on speaker for pt, [phone], call for contact

## 2018-10-19 NOTE — ED Notes (Signed)
Dr Vianne Bulls notified by text (acknowledged receipt) of pts troponin 0.06.

## 2018-10-19 NOTE — ED Notes (Signed)
Per conversation with Lea, charge, ICU is full (lack of staffing), pt to go up after 7a

## 2018-10-19 NOTE — ED Notes (Signed)
Call bell light answered, pt requests to urinate

## 2018-10-19 NOTE — ED Notes (Signed)
ED TO INPATIENT HANDOFF REPORT  ED Nurse Name and Phone #: Kathryne Hitch Name/Age/Gender Sara Munoz 53 y.o. female Room/Bed: ED06A/ED06A  Code Status   Code Status: Full Code  Home/SNF/Other Home   Is this baseline? Yes   Triage Complete: Triage complete  Chief Complaint shob  Triage Note Patient presents to Emergency Department via [facility] from HOME with complaints of N/V/genberalizexd weakness for 2-3 days, tonight SOB with cough.  Pt reports body aches, RLQ abdominal pain and chest pain.    EMS gave 553mls NS bolus for low BP        Allergies Allergies  Allergen Reactions  . Flexeril [Cyclobenzaprine] Hives and Swelling    Facial/lip swelling     . Levofloxacin Hives and Swelling  . Keflex [Cephalexin] Hives  . Ketorolac   . Phenergan [Promethazine Hcl] Other (See Comments)    Agitation.   . Toradol [Ketorolac Tromethamine] Swelling and Other (See Comments)    Reaction:  Facial/tongue swelling  Reaction:  Facial/tongue swelling   . Tramadol Hives, Swelling and Other (See Comments)    Reaction:  Lip swelling  Reaction:  Lip swelling   . Zoloft [Sertraline Hcl] Swelling    Tongue swelling       Level of Care/Admitting Diagnosis ED Disposition    ED Disposition Condition [facility] Area: [place] [[id number]]  Level of Care: ICU [6]  Covid Evaluation: N/A  Diagnosis: Severe sepsis [facility]) [[id number]]  Admitting Physician: Christel Mormon [[id number]]  Attending Physician: Christel Mormon [[id number]]  Estimated length of stay: past midnight tomorrow  Certification:: I certify this patient will need inpatient services for at least 2 midnights  PT Class (Do Not Modify): Inpatient [101]  PT Acc Code (Do Not Modify): Private [1]       B Medical/Surgery History Past Medical History:  Diagnosis Date  . Anxiety   . Asthma   . Atypical chest pain 08/02/2015  . CAD (coronary artery disease)   . Chronic back pain   . COPD  (chronic obstructive pulmonary disease) ([place])   . Coronary artery disease    a. Mild-non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/2014  . GERD (gastroesophageal reflux disease)   . Hypercholesteremia   . Hypertension   . Hypokalemia   . Hypomagnesemia 01/04/2014  . Liver disease   . MI (myocardial infarction) ([place])   . Opiate use 02/27/2016  . Osteoarthritis   . Stroke [facility])    Past Surgical History:  Procedure Laterality Date  . ANTERIOR CERVICAL DECOMP/DISCECTOMY FUSION N/A 03/30/2018   Procedure: Cervical six-seven Anterior cervical decompression/discectomy/fusion;  Surgeon: Judith Part, MD;  Location: [place];  Service: Neurosurgery;  Laterality: N/A;  . BACK SURGERY    . CARDIAC CATHETERIZATION Left 02/22/2016   Procedure: Left Heart Cath and Coronary Angiography;  Surgeon: Yolonda Kida, MD;  Location: [place] CV LAB;  Service: Cardiovascular;  Laterality: Left;  . COLONOSCOPY WITH PROPOFOL N/A 09/19/2016   Procedure: COLONOSCOPY WITH PROPOFOL;  Surgeon: [name], MD;  Location: [facility] ENDOSCOPY;  Service: Endoscopy;  Laterality: N/A;  . ESOPHAGOGASTRODUODENOSCOPY (EGD) WITH PROPOFOL N/A 09/18/2017   Procedure: ESOPHAGOGASTRODUODENOSCOPY (EGD) WITH PROPOFOL;  Surgeon: [name], MD;  Location: [facility] ENDOSCOPY;  Service: Gastroenterology;  Laterality: N/A;  . hemorroids    . NASAL SINUS SURGERY    . ORIF FEMUR FRACTURE    . ORIF TIBIA & FIBULA FRACTURES    . OVARY SURGERY  A IV Location/Drains/Wounds Patient Lines/Drains/Airways Status   Active Line/Drains/Airways    Name:   Placement date:   Placement time:   Site:   Days:   Peripheral IV 10/19/18 Left Forearm   10/19/18    0059    Forearm   less than 1   Peripheral IV 10/19/18 Right Antecubital   10/19/18    0110    Antecubital   less than 1   Incision (Closed) 03/30/18 Neck   03/30/18    1411     203          Intake/Output Last 24 hours  Intake/Output Summary (Last 24 hours) at 10/19/2018 0973 Last data filed  at 10/19/2018 0430 Gross per 24 hour  Intake 4198.52 ml  Output 100 ml  Net 4098.52 ml    Labs/Imaging Results for orders placed or performed during the hospital encounter of 10/19/18 (from the past 48 hour(s))  Lactic acid, plasma     Status: Abnormal   Collection Time: 10/19/18  1:28 AM  Result Value Ref Range   Lactic Acid, Venous 3.3 (HH) 0.5 - 1.9 mmol/L    Comment: CRITICAL RESULT CALLED TO, READ BACK BY AND VERIFIED WITH NOEL WEBSTER RN 10/19/2018 @ 0220 RDW Performed at [facility], [address] [place], [place] [address]   Comprehensive metabolic panel     Status: Abnormal   Collection Time: 10/19/18  1:28 AM  Result Value Ref Range   Sodium 137 135 - 145 mmol/L   Potassium 2.8 (L) 3.5 - 5.1 mmol/L   Chloride 99 98 - 111 mmol/L   CO2 22 22 - 32 mmol/L   Glucose, Bld 108 (H) 70 - 99 mg/dL   BUN 20 6 - 20 mg/dL   Creatinine, Ser 1.52 (H) 0.44 - 1.00 mg/dL   Calcium 8.7 (L) 8.9 - 10.3 mg/dL   Total Protein 7.4 6.5 - 8.1 g/dL   Albumin 3.7 3.5 - 5.0 g/dL   AST 37 15 - 41 U/L   ALT 21 0 - 44 U/L   Alkaline Phosphatase 113 38 - 126 U/L   Total Bilirubin 1.1 0.3 - 1.2 mg/dL   GFR calc non Af Amer 40 (L) >60 mL/min   GFR calc Af Amer 46 (L) >60 mL/min   Anion gap 16 (H) 5 - 15    Comment: Performed at [facility], [address] [place], [place] [address]  CBC WITH DIFFERENTIAL     Status: Abnormal   Collection Time: 10/19/18  1:28 AM  Result Value Ref Range   WBC 5.6 4.0 - 10.5 K/uL   RBC 5.14 (H) 3.87 - 5.11 MIL/uL   Hemoglobin 14.9 12.0 - 15.0 g/dL   HCT 45.6 36.0 - 46.0 %   MCV 88.7 80.0 - 100.0 fL   MCH 29.0 26.0 - 34.0 pg   MCHC 32.7 30.0 - 36.0 g/dL   RDW 18.2 (H) 11.5 - 15.5 %   Platelets 123 (L) 150 - 400 K/uL   nRBC 0.9 (H) 0.0 - 0.2 %   Neutrophils Relative % 86 %   Neutro Abs 4.9 1.7 - 7.7 K/uL   Lymphocytes Relative 7 %   Lymphs Abs 0.4 (L) 0.7 - 4.0 K/uL   Monocytes Relative 2 %   Monocytes Absolute 0.1 0.1 - 1.0 K/uL    Eosinophils Relative 3 %   Eosinophils Absolute 0.1 0.0 - 0.5 K/uL   Basophils Relative 1 %   Basophils Absolute 0.0 0.0 - 0.1  K/uL   WBC Morphology MORPHOLOGY UNREMARKABLE    RBC Morphology MORPHOLOGY UNREMARKABLE    Smear Review MORPHOLOGY UNREMARKABLE    Immature Granulocytes 1 %   Abs Immature Granulocytes 0.05 0.00 - 0.07 K/uL    Comment: Performed at [facility], [address] [place], [place] [address]  SARS Coronavirus 2 [facility] order, Performed in [place] hospital lab)     Status: None   Collection Time: 10/19/18  1:28 AM  Result Value Ref Range   SARS Coronavirus 2 NEGATIVE NEGATIVE    Comment: (NOTE) If result is NEGATIVE SARS-CoV-2 target nucleic acids are NOT DETECTED. The SARS-CoV-2 RNA is generally detectable in upper and lower  respiratory specimens during the acute phase of infection. The lowest  concentration of SARS-CoV-2 viral copies this assay can detect is 250  copies / mL. A negative result does not preclude SARS-CoV-2 infection  and should not be used as the sole basis for treatment or other  patient management decisions.  A negative result may occur with  improper specimen collection / handling, submission of specimen other  than nasopharyngeal swab, presence of viral mutation(s) within the  areas targeted by this assay, and inadequate number of viral copies  (<250 copies / mL). A negative result must be combined with clinical  observations, patient history, and epidemiological information. If result is POSITIVE SARS-CoV-2 target nucleic acids are DETECTED. The SARS-CoV-2 RNA is generally detectable in upper and lower  respiratory specimens dur ing the acute phase of infection.  Positive  results are indicative of active infection with SARS-CoV-2.  Clinical  correlation with patient history and other diagnostic information is  necessary to determine patient infection status.  Positive results do  not rule out bacterial infection or  co-infection with other viruses. If result is PRESUMPTIVE POSTIVE SARS-CoV-2 nucleic acids MAY BE PRESENT.   A presumptive positive result was obtained on the submitted specimen  and confirmed on repeat testing.  While 2019 novel coronavirus  (SARS-CoV-2) nucleic acids may be present in the submitted sample  additional confirmatory testing may be necessary for epidemiological  and / or clinical management purposes  to differentiate between  SARS-CoV-2 and other Sarbecovirus currently known to infect humans.  If clinically indicated additional testing with an alternate test  methodology [phone]) is advised. The SARS-CoV-2 RNA is generally  detectable in upper and lower respiratory sp ecimens during the acute  phase of infection. The expected result is Negative. Fact Sheet for Patients:  StrictlyIdeas.no Fact Sheet for Healthcare Providers: BankingDealers.co.za This test is not yet approved or cleared by the [place] FDA and has been authorized for detection and/or diagnosis of SARS-CoV-2 by FDA under an Emergency Use Authorization (EUA).  This EUA will remain in effect (meaning this test can be used) for the duration of the COVID-19 declaration under Section 564(b)(1) of the Act, 21 U.S.C. section 360bbb-3(b)(1), unless the authorization is terminated or revoked sooner. Performed at [facility], [address] [place], [place] [address]   Magnesium     Status: Abnormal   Collection Time: 10/19/18  1:28 AM  Result Value Ref Range   Magnesium 1.2 (L) 1.7 - 2.4 mg/dL    Comment: Performed at [facility], [address] [place], [place] [address]  Urinalysis, Routine w reflex microscopic     Status: Abnormal   Collection Time: 10/19/18  2:15 AM  Result Value Ref Range   Color, Urine AMBER (A) YELLOW    Comment: BIOCHEMICALS MAY  BE AFFECTED BY COLOR   APPearance CLOUDY (A) CLEAR   Specific Gravity, Urine  1.012 1.005 - 1.030   pH 5.0 5.0 - 8.0   Glucose, UA NEGATIVE NEGATIVE mg/dL   Hgb urine dipstick SMALL (A) NEGATIVE   Bilirubin Urine NEGATIVE NEGATIVE   Ketones, ur NEGATIVE NEGATIVE mg/dL   Protein, ur 100 (A) NEGATIVE mg/dL   Nitrite NEGATIVE NEGATIVE   Leukocytes,Ua MODERATE (A) NEGATIVE   RBC / HPF 6-10 0 - 5 RBC/hpf   WBC, UA >50 (H) 0 - 5 WBC/hpf   Bacteria, UA FEW (A) NONE SEEN   Squamous Epithelial / LPF 0-5 0 - 5   WBC Clumps PRESENT    Mucus PRESENT    Hyaline Casts, UA PRESENT     Comment: Performed at [facility], [address] [place], [place] [address]  Pregnancy, urine     Status: None   Collection Time: 10/19/18  2:15 AM  Result Value Ref Range   Preg Test, Ur NEGATIVE NEGATIVE    Comment: Performed at [facility], [address] [place], [place] [address]  Lactic acid, plasma     Status: Abnormal   Collection Time: 10/19/18  3:28 AM  Result Value Ref Range   Lactic Acid, Venous 2.2 (HH) 0.5 - 1.9 mmol/L    Comment: CRITICAL RESULT CALLED TO, READ BACK BY AND VERIFIED WITH NOEL WEBSTER RN 10/19/2018 @ 0352 RDW Performed at [facility], [address] [place], [place] [address]   Protime-INR     Status: None   Collection Time: 10/19/18  6:50 AM  Result Value Ref Range   Prothrombin Time 14.8 11.4 - 15.2 seconds   INR 1.2 0.8 - 1.2    Comment: (NOTE) INR goal varies based on device and disease states. Performed at [facility], [address] [place],  [address]    Ct Abdomen Pelvis W Contrast  Result Date: 10/19/2018 CLINICAL DATA:  Initial evaluation for acute nausea, vomiting, generalized abdominal pain. EXAM: CT ABDOMEN AND PELVIS WITH CONTRAST TECHNIQUE: Multidetector CT imaging of the abdomen and pelvis was performed using the standard protocol following bolus administration of intravenous contrast. CONTRAST:  26mL OMNIPAQUE IOHEXOL 300 MG/ML  SOLN COMPARISON:  Prior CT from 06/09/2018 FINDINGS:  Lower chest: Scattered subsegmental atelectatic changes seen dependently within the visualized lung bases. Visualized lungs are otherwise clear. Coronary artery calcifications partially visualized. Hepatobiliary: Liver demonstrates a normal contrast enhanced appearance. Gallbladder within normal limits. No biliary dilatation. Pancreas: Pancreas within normal limits. Spleen: Spleen within normal limits. Adrenals/Urinary Tract: Adrenal glands are normal. Right kidney slightly enlarged as compared to the left with associated perinephric fat stranding. Mild stranding extends about the proximal right ureter. No obstructive stone identified. Striated nephrogram seen on delayed sequence. Findings raise the possibility for possible pyelonephritis. No collection or other complication. No other radiopaque calculi seen within either kidney or along the course of either renal collecting system. No focal enhancing renal mass. Partially distended bladder within normal limits. No layering stones within the bladder lumen. Stomach/Bowel: Stomach demonstrates no acute finding. Probable small diverticulum noted at the gastric fundus. No evidence for bowel obstruction. Normal appendix. No acute inflammatory changes seen about the bowels. Vascular/Lymphatic: Moderate aorto bi-iliac atherosclerotic disease. No aneurysm. Mesenteric vessels patent proximally. No adenopathy. Reproductive: Uterus and ovaries within normal limits. Other: No free air or fluid. Musculoskeletal: Fixation rod partially visualize within the left femur. No acute osseous finding. No discrete lytic or blastic osseous lesions.  IMPRESSION: 1. Hazy perinephric fat stranding with striated nephrogram about the right kidney, suspicious for acute pyelonephritis. Correlation with urinalysis recommended. No obstructive stone, collection, or other complication identified. 2. Moderate aortic atherosclerosis with coronary artery calcifications. Electronically Signed   By:  Jeannine Boga M.D.   On: 10/19/2018 03:33   Dg Chest Port 1 View  Result Date: 10/19/2018 CLINICAL DATA:  53 year old female with weakness, shortness of breath, fever. EXAM: PORTABLE CHEST 1 VIEW COMPARISON:  06/13/2018 and earlier. FINDINGS: Portable AP upright view at 0057 hours. Lower lung volumes. Mediastinal contours are stable and within normal limits. Visualized tracheal air column is within normal limits. Allowing for portable technique the lungs are clear. No pneumothorax. Prior cervical ACDF. No acute osseous abnormality identified. Paucity of bowel gas in the upper abdomen. IMPRESSION: Lower lung volumes.  No acute cardiopulmonary abnormality. Electronically Signed   By: Genevie Ann M.D.   On: 10/19/2018 01:49    Pending Labs Unresulted Labs (From admission, onward)    Start     Ordered   10/26/18 0500  Creatinine, serum  (enoxaparin (LOVENOX)    CrCl >/= 30 ml/min)  Weekly,   STAT    Comments:  while on enoxaparin therapy    10/19/18 0443   10/19/18 0532  Troponin I - Now Then Q6H  Now then every 6 hours,   STAT     10/19/18 0532   10/19/18 0500  Cortisol-am, blood  Tomorrow morning,   STAT     10/19/18 0443   10/19/18 0500  Procalcitonin  Tomorrow morning,   STAT     10/19/18 0443   10/19/18 6294  Basic metabolic panel  Tomorrow morning,   STAT     10/19/18 0443   10/19/18 0500  CBC  Tomorrow morning,   STAT     10/19/18 0443   10/19/18 0439  Magnesium  Once,   STAT     10/19/18 0443   10/19/18 0439  Phosphorus  Once,   STAT     10/19/18 0443   10/19/18 0436  HIV antibody (Routine Testing)  Once,   STAT     10/19/18 0443   10/19/18 0057  Urine culture  ONCE - STAT,   STAT     10/19/18 0056   10/19/18 0056  Blood Culture (routine x 2)  BLOOD CULTURE X 2,   STAT     10/19/18 0056          Vitals/Pain Today's Vitals   10/19/18 0630 10/19/18 0642 10/19/18 0645 10/19/18 0700  BP: 100/71   101/63  Pulse: (!) 122  (!) 117 (!) 118  Resp: (!) 31  (!) 31 (!) 27   Temp:      TempSrc:      SpO2: 98%  97% 92%  Weight:      Height:      PainSc:  5       Isolation Precautions Droplet and Contact precautions  Medications Medications  potassium chloride (KLOR-CON) packet 40 mEq (has no administration in time range)  norepinephrine (LEVOPHED) 4mg  in 253mL premix infusion (1 mcg/min Intravenous New Bag/Given 10/19/18 0539)  enoxaparin (LOVENOX) injection 40 mg (40 mg Subcutaneous Given 10/19/18 0532)  0.9 %  sodium chloride infusion ( Intravenous New Bag/Given 10/19/18 0535)  aztreonam (AZACTAM) 2 g in sodium chloride 0.9 % 100 mL IVPB (has no administration in time range)  metroNIDAZOLE (FLAGYL) IVPB 500 mg (has no administration in time range)  acetaminophen (TYLENOL) tablet 650 mg (  650 mg Oral Given 10/19/18 0530)    Or  acetaminophen (TYLENOL) suppository 650 mg ( Rectal See Alternative 10/19/18 0530)  ondansetron (ZOFRAN) tablet 4 mg (has no administration in time range)    Or  ondansetron (ZOFRAN) injection 4 mg (has no administration in time range)  oxyCODONE-acetaminophen (PERCOCET/ROXICET) 5-325 MG per tablet 1 tablet (has no administration in time range)  vancomycin (VANCOCIN) IVPB 750 mg/150 ml premix (has no administration in time range)  aztreonam (AZACTAM) 2 g in sodium chloride 0.9 % 100 mL IVPB (0 g Intravenous Stopped 10/19/18 0204)  metroNIDAZOLE (FLAGYL) IVPB 500 mg ( Intravenous Stopped 10/19/18 0232)  sodium chloride 0.9 % bolus 1,000 mL (0 mLs Intravenous Stopped 10/19/18 0204)  vancomycin (VANCOCIN) 1,750 mg in sodium chloride 0.9 % 500 mL IVPB ( Intravenous Stopped 10/19/18 0424)  morphine 4 MG/ML injection 4 mg (4 mg Intravenous Given 10/19/18 0209)  ondansetron (ZOFRAN) injection 4 mg (4 mg Intravenous Given 10/19/18 0207)  sodium chloride 0.9 % bolus 1,000 mL (0 mLs Intravenous Stopped 10/19/18 0259)  iohexol (OMNIPAQUE) 240 MG/ML injection 50 mL (50 mLs Oral Contrast Given 10/19/18 0232)  iohexol (OMNIPAQUE) 300 MG/ML solution 75 mL  (75 mLs Intravenous Contrast Given 10/19/18 0300)  sodium chloride 0.9 % bolus 1,000 mL (0 mLs Intravenous Stopped 10/19/18 0430)  potassium chloride (KLOR-CON) packet 40 mEq (40 mEq Oral Given 10/19/18 0435)    Mobility walks with person assist Moderate fall risk   Focused Assessments 1   R Recommendations: See Admitting Provider Note  Report given to:   Additional Notes:

## 2018-10-19 NOTE — ED Notes (Signed)
pharm called for potassium

## 2018-10-19 NOTE — ED Notes (Signed)
CRITICAL LAB: LACTIC is 2.2, [facility], Dr. Kerman Passey notified, orders received

## 2018-10-19 NOTE — Progress Notes (Signed)
*  PRELIMINARY RESULTS* Echocardiogram 2D Echocardiogram has been performed. Definity image enhancer was administered.  Sara Munoz Bryler Dibble 10/19/2018, 3:27 PM

## 2018-10-19 NOTE — ED Notes (Signed)
Patient transported to CT 

## 2018-10-19 NOTE — Progress Notes (Signed)
Admitted for severe sepsis with septic shock secondary to acute pyelonephritis, patient does have right flank pain, she says she feels like crap, agree with aggressive antibiotics with Azactam, Flagyl to cover for sepsis, continue aggressive hydration, replace potassium, COVID-19 test has been negative. #2 slightly elevated troponins of 0.06 and 0.11 but patient denies any chest pain, slight elevated troponins likely demand ischemia due to sepsis, patient has no chest pain follow closely.,  Check echocardiogram.

## 2018-10-19 NOTE — ED Provider Notes (Signed)
[facility] Emergency Department Provider Note  Time seen: 1:59 AM  I have reviewed the triage vital signs and the nursing notes.   HISTORY  Chief Complaint Nausea; Abdominal Pain; Emesis; and Chest Pain   HPI [name] is a 53 y.o. female with a past medical history of anxiety, asthma, CAD, COPD, gastric reflux, hypertension, hyperlipidemia, CVA, presents to the emergency department with symptoms of generalized fatigue, weakness, shortness of breath, cough, nausea and vomiting and fever.  Patient found to be febrile by EMS to 101.  Patient is complaining of vague abdominal discomfort worse across the right side of her abdomen.  Patient states mild chest discomfort, states shortness of breath and cough but a history of COPD per patient.  Has been nauseated and vomiting for the past several days although denies any diarrhea.  Describes her abdominal pain as mild to moderate mostly right-sided.   Past Medical History:  Diagnosis Date  . Anxiety   . Asthma   . Atypical chest pain 08/02/2015  . CAD (coronary artery disease)   . Chronic back pain   . COPD (chronic obstructive pulmonary disease) ([place])   . Coronary artery disease    a. Mild-non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/2014  . GERD (gastroesophageal reflux disease)   . Hypercholesteremia   . Hypertension   . Hypokalemia   . Hypomagnesemia 01/04/2014  . Liver disease   . MI (myocardial infarction) ([place])   . Opiate use 02/27/2016  . Osteoarthritis   . Stroke [facility])     Patient Active Problem List   Diagnosis Date Noted  . Cervical radiculopathy 02/12/2018  . Chronic migraine 02/12/2018  . Paresthesia 08/29/2017  . Neck pain 08/29/2017  . Daily headache 08/29/2017  . Chronic active hepatitis ([place]) 07/29/2017  . Neurogenic pain 07/31/2016  . Chronic pain syndrome 06/05/2016  . Carotid artery stenosis 02/27/2016  . Chronic constipation 02/27/2016  . Chronic nausea 02/27/2016  . Hypercholesterolemia  02/27/2016  . Osteoarthritis 02/27/2016  . PTSD (post-traumatic stress disorder) 02/27/2016  . TIA (transient ischemic attack) 02/27/2016  . Long term current use of opiate analgesic 02/27/2016  . Long term prescription opiate use 02/27/2016  . Encounter for pain management consult 02/27/2016  . Chronic hip pain (Location of Tertiary source of pain) (Bilateral) (L>R) 02/27/2016  . Chronic knee pain (Bilateral) (L>R) 02/27/2016  . Chronic shoulder pain (Bilateral) (L>R) 02/27/2016  . Chronic sacroiliac joint pain (Bilateral) (L>R) 02/27/2016  . Chronic low back pain (Location of Primary Source of Pain) (Bilateral) (L>R) 02/27/2016  . Chronic lower extremity pain (Location of Secondary source of pain) (Bilateral) (L>R) 02/27/2016  . Osteoarthritis of hip (Bilateral) (L>R) 02/27/2016  . Chronic neck pain (posterior midline) (Bilateral) (L>R) 02/27/2016  . Cervicogenic headache (Bilateral) (L>R) 02/27/2016  . Occipital headache (Bilateral) (L>R) 02/27/2016  . Chronic upper extremity pain (Bilateral) (L>R) 02/27/2016  . Chronic Cervical radicular pain (Bilateral) (L>R) 02/27/2016  . Chronic lumbar radicular pain (Right) (L5 dermatome) 02/27/2016  . Lumbar facet syndrome (Bilateral) (L>R) 02/27/2016  . Long term prescription benzodiazepine use 02/27/2016  . Hypertension   . COPD (chronic obstructive pulmonary disease) ([place]) 10/09/2015  . GERD (gastroesophageal reflux disease) 10/09/2015  . Non-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/2014 08/02/2015  . Hyperlipidemia 08/02/2015  . Costochondritis 08/02/2015  . 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 GAD (generalized anxiety disorder) 09/03/2014  . Narcotic dependence ([place]) 09/03/2014  . PVD (peripheral vascular disease) ([place]) 09/03/2014  . Smoker 09/03/2014  . Cigarette nicotine  dependence with nicotine-induced disorder 07/08/2014  . Anemia of chronic disease 03/19/2014  . Narcotic abuse, continuous ([place]) 03/19/2014  . Polysubstance abuse ([place])  03/19/2014  . MSSA (methicillin susceptible Staphylococcus aureus) septicemia ([place]) 03/07/2014  . Hypomagnesemia 01/04/2014  . Chronic cough 09/04/2011  . Sleep apnea, obstructive 09/04/2011    Past Surgical History:  Procedure Laterality Date  . ANTERIOR CERVICAL DECOMP/DISCECTOMY FUSION N/A 03/30/2018   Procedure: Cervical six-seven Anterior cervical decompression/discectomy/fusion;  Surgeon: Judith Part, MD;  Location: [place];  Service: Neurosurgery;  Laterality: N/A;  . BACK SURGERY    . CARDIAC CATHETERIZATION Left 02/22/2016   Procedure: Left Heart Cath and Coronary Angiography;  Surgeon: Yolonda Kida, MD;  Location: [place] [facility];  Service: Cardiovascular;  Laterality: Left;  . COLONOSCOPY WITH PROPOFOL N/A 09/19/2016   Procedure: COLONOSCOPY WITH PROPOFOL;  Surgeon: [name], MD;  Location: [facility] ENDOSCOPY;  Service: Endoscopy;  Laterality: N/A;  . ESOPHAGOGASTRODUODENOSCOPY (EGD) WITH PROPOFOL N/A 09/18/2017   Procedure: ESOPHAGOGASTRODUODENOSCOPY (EGD) WITH PROPOFOL;  Surgeon: [name], MD;  Location: [facility] ENDOSCOPY;  Service: Gastroenterology;  Laterality: N/A;  . hemorroids    . NASAL SINUS SURGERY    . ORIF FEMUR FRACTURE    . ORIF TIBIA & FIBULA FRACTURES    . OVARY SURGERY      Prior to Admission medications   Medication Sig Start Date End Date Taking? Authorizing Provider  albuterol (PROVENTIL HFA;VENTOLIN HFA) 108 (90 Base) MCG/ACT inhaler Inhale 2 puffs into the lungs every 6 (six) hours as needed for wheezing or shortness of breath. 05/26/17   [name], [name]  atorvastatin (LIPITOR) 20 MG tablet Take 1 tablet (20 mg total) by mouth daily. 07/09/18   [name], [name]  budesonide-formoterol (SYMBICORT) 160-4.5 MCG/ACT inhaler Inhale 2 puffs into the lungs 2 (two) times daily. 05/26/17   [name], [name]  docusate sodium (COLACE) 100 MG capsule Take 200 mg by mouth 2 (two) times daily as needed for mild constipation.    [provider]  DULoxetine (CYMBALTA) 60 MG capsule Take 1 capsule (60 mg total) by mouth daily. 02/12/18   [name], MD  gabapentin (NEURONTIN) 600 MG tablet Take 1 tablet (600 mg total) by mouth 3 (three) times daily. 06/26/17 07/17/18  [name], [name]  hydrochlorothiazide (HYDRODIURIL) 25 MG tablet Take 1 tablet (25 mg total) by mouth daily. 06/23/18   [name], [name]  ipratropium-albuterol (DUONEB) 0.5-2.5 (3) MG/3ML SOLN Take 3 mLs by nebulization every 6 (six) hours as needed. 07/17/18   Laverle Hobby, MD  lisinopril (PRINIVIL,ZESTRIL) 40 MG tablet Take 1 tablet (40 mg total) by mouth daily. 08/05/17   [name], [name]  metoprolol tartrate (LOPRESSOR) 25 MG tablet Take 1 tablet (25 mg total) by mouth 2 (two) times daily. 08/17/18   [name], [name]  Naloxone HCl (NARCAN IJ) Inject as directed as needed.    [provider]  nitroGLYCERIN (NITROSTAT) 0.4 MG SL tablet Place 1 tablet (0.4 mg total) under the tongue every 5 (five) minutes x 3 doses as needed for chest pain. 08/03/15   Demetrios Loll, MD  omeprazole (PRILOSEC) 40 MG capsule Take 1 capsule (40 mg total) by mouth daily. 05/19/18   [name], MD  ondansetron (ZOFRAN-ODT) 8 MG disintegrating tablet Take 1 tablet (8 mg total) by mouth 2 (two) times daily. 02/12/18   [name], MD  Oxycodone HCl 10 MG TABS Take 10 mg by mouth 3 (three) times  daily. 06/19/18   [provider]  rizatriptan (MAXALT-MLT) 10 MG disintegrating tablet Take 1 tablet (10 mg total) by mouth as needed. May repeat in 2 hours if needed 02/12/18   [name], MD  SPIRIVA RESPIMAT 2.5 MCG/ACT AERS TAKE 1 PUFF BY MOUTH EVERY DAY 09/21/18   [name], [name]  sucralfate (CARAFATE) 1 GM/10ML suspension Take 10 mLs (1 g total) by mouth 4 (four) times daily. 05/19/18   [name], MD  tiZANidine (ZANAFLEX) 4 MG capsule Take 4 mg by mouth daily.     [provider]    Allergies  Allergen Reactions  . Flexeril  [Cyclobenzaprine] Hives and Swelling    Facial/lip swelling     . Levofloxacin Hives and Swelling  . Keflex [Cephalexin] Hives  . Ketorolac   . Phenergan [Promethazine Hcl] Other (See Comments)    Agitation.   . Toradol [Ketorolac Tromethamine] Swelling and Other (See Comments)    Reaction:  Facial/tongue swelling  Reaction:  Facial/tongue swelling   . Tramadol Hives, Swelling and Other (See Comments)    Reaction:  Lip swelling  Reaction:  Lip swelling   . Zoloft [Sertraline Hcl] Swelling    Tongue swelling       Family History  Problem Relation Age of Onset  . Heart disease Mother   . Lung cancer Mother   . Ovarian cancer Mother   . Healthy Brother   . Healthy Brother   . Healthy Brother   . Diabetes Maternal Uncle   . Breast cancer Maternal Aunt     Social History Social History   Tobacco Use  . Smoking status: Current Every Day Smoker    Packs/day: 0.50    Types: Cigarettes  . Smokeless tobacco: Never Used  Substance Use Topics  . Alcohol use: Yes    Alcohol/week: 0.0 standard drinks  . Drug use: Yes    Types: Marijuana    Comment: 2 months ago     Review of Systems Constitutional: Positive for fever ENT: Negative for recent illness/congestion Cardiovascular: Mild chest discomfort Respiratory: Positive for shortness of breath.  Positive for cough.  History of COPD and states this is chronic. Gastrointestinal: Positive for abdominal discomfort.  Positive for nausea vomiting.  Negative for diarrhea. Genitourinary: Negative for urinary compaints Musculoskeletal: Negative for musculoskeletal complaints Skin: Negative for skin complaints  Neurological: Negative for headache All other ROS negative  ____________________________________________   PHYSICAL EXAM:  VITAL SIGNS: ED Triage Vitals  Enc Vitals Group     BP 10/19/18 0103 96/67     Pulse Rate 10/19/18 0103 (!) 131     Resp 10/19/18 0103 (!) 31     Temp 10/19/18 0103 100.1 F (37.8 C)     Temp  Source 10/19/18 0103 Oral     SpO2 10/19/18 0059 94 %     Weight 10/19/18 0104 180 lb (81.6 kg)     Height 10/19/18 0104 5\' 4"  (1.626 m)     Head Circumference --      Peak Flow --      Pain Score 10/19/18 0104 8     Pain Loc --      Pain Edu? --      Excl. in [place]? --    Constitutional: Alert and oriented. Well appearing and in no distress. Eyes: Normal exam ENT      Head: Normocephalic and atraumatic.      Mouth/Throat: Mucous membranes are moist. Cardiovascular: Regular rhythm rate around 120 bpm.  No murmur. Respiratory: Mild tachypnea but clear lung sounds bilaterally without any obvious wheeze rales or rhonchi. Gastrointestinal: Soft and nontender. No distention.  Musculoskeletal: Nontender with normal range of motion in all extremities.  Neurologic:  Normal speech and language. No gross focal neurologic deficits  Skin:  Skin is warm, dry and intact.  Psychiatric: Mood and affect are normal.   ____________________________________________    EKG  EKG viewed and interpreted by myself shows sinus tachycardia 132 bpm with a narrow QRS, normal axis, normal intervals with nonspecific ST changes.  ____________________________________________    RADIOLOGY  CT most consistent with pyelonephritis  ____________________________________________   INITIAL IMPRESSION / ASSESSMENT AND PLAN / ED COURSE  Pertinent labs & imaging results that were available during my care of the patient were reviewed by me and considered in my medical decision making (see chart for details).   Patient presents to the emergency department for multiple complaints including weakness, fatigue, cough, shortness of breath, abdominal pain, nausea and vomiting.  Patient found to be febrile with mild hypotension by EMS.  Given IV fluids, and brought to the emergency department.  Upon arrival patient is tachycardic, tachypneic, febrile with borderline hypotension.  Differential is quite broad but would include  upper respiratory infection, pneumonia, intra-abdominal pathology such as cholecystitis, appendicitis, colitis/diverticulitis urinary tract infection/pyelonephritis, among others.  With multiple complaints and findings consistent with sepsis patient has been started on broad-spectrum antibiotics, we will check labs, cultures, chest x-ray, CT scan of the abdomen/pelvis to further evaluate.  Patient agreeable to plan of care.  Patient is work-up shows a significant elevation of lactate.  Patient's urinalysis consistent with urinary tract infection.  CT scan consistent with pyelonephritis.  Patient will be admitted to the hospitalist service for continued IV antibiotics.  Patient agreeable to plan of care.  LUNABELLE OATLEY was evaluated in Emergency Department on 10/19/2018 for the symptoms described in the history of present illness. She was evaluated in the context of the global COVID-19 pandemic, which necessitated consideration that the patient might be at risk for infection with the SARS-CoV-2 virus that causes COVID-19. Institutional protocols and algorithms that pertain to the evaluation of patients at risk for COVID-19 are in a state of rapid change based on information released by regulatory bodies including the CDC and federal and state organizations. These policies and algorithms were followed during the patient's care in the ED.  CRITICAL CARE Performed by: Harvest Dark   Total critical care time: 30 minutes  Critical care time was exclusive of separately billable procedures and treating other patients.  Critical care was necessary to treat or prevent imminent or life-threatening deterioration.  Critical care was time spent personally by me on the following activities: development of treatment plan with patient and/or surrogate as well as nursing, discussions with consultants, evaluation of patient's response to treatment, examination of patient, obtaining history from patient or  surrogate, ordering and performing treatments and interventions, ordering and review of laboratory studies, ordering and review of radiographic studies, pulse oximetry and re-evaluation of patient's condition.   ____________________________________________   FINAL CLINICAL IMPRESSION(S) / ED DIAGNOSES  Sepsis Pyelonephritis Urinary tract infection   Harvest Dark, MD 10/19/18 [id number]

## 2018-10-19 NOTE — Progress Notes (Signed)
PHARMACIST - PHYSICIAN ORDER COMMUNICATION  CONCERNING: P&T Medication Policy on  Serotonin 5-HT Agonists  DESCRIPTION:  This patient has an order for:  RIZATRIPTAN BENZOATE 10 MG PO TBDP     ACTION TAKEN: Current order is for a NON-FORMULARY medication.   Per [facility] the order has been changed to Sumatriptan 50mg .   Pernell Dupre, PharmD, BCPS Clinical Pharmacist 10/19/2018 10:42 PM

## 2018-10-19 NOTE — ED Notes (Signed)
Patient resting in bed, call bell in reach, bed locked and in lowest position

## 2018-10-19 NOTE — Progress Notes (Signed)
CODE SEPSIS - PHARMACY COMMUNICATION  **Broad Spectrum Antibiotics should be administered within 1 hour of Sepsis diagnosis**  Time Code Sepsis Called/Page Received: 7737  Antibiotics Ordered: vanc/aztreonam/flagyl  Time of 1st antibiotic administration: 0130  Additional action taken by pharmacy: Spoke to RN to confirm allergy to keflex--if reaction not severe will switch to cefepime  If necessary, Name of Provider/Nurse Contacted:     Tobie Lords ,PharmD Clinical Pharmacist  10/19/2018  4:57 AM

## 2018-10-19 NOTE — ED Triage Notes (Signed)
Patient presents to Emergency Department via [facility] from HOME with complaints of N/V/genberalizexd weakness for 2-3 days, tonight SOB with cough.  Pt reports body aches, RLQ abdominal pain and chest pain.    EMS gave 568mls NS bolus for low BP

## 2018-10-19 NOTE — Progress Notes (Signed)
Pharmacy Antibiotic Note  Sara Munoz is a 53 y.o. female admitted on 10/19/2018 with sepsis s/t acute pyelonephritis.  Pharmacy has been consulted for Cefepime dosing.  Plan: Per CCM discussion and discussion with patient who is amenable to trying a cephalosporin despite allergy (allergy was only hives/rash per patient), ordered Cefepime 2 g IV q8h. Discontinued Vanc/Aztreonam/Flagyl.  Height: 5\' 4"  (162.6 cm) Weight: 193 lb 9 oz (87.8 kg) IBW/kg (Calculated) : 54.7  Temp (24hrs), Avg:99 F (37.2 C), Min:98 F (36.7 C), Max:100.1 F (37.8 C)  Recent Labs  Lab 10/19/18 0128 10/19/18 0328 10/19/18 0650  WBC 5.6  --  10.9*  CREATININE 1.52*  --  1.19*  LATICACIDVEN 3.3* 2.2*  --     Estimated Creatinine Clearance: 60.6 mL/min (A) (by C-G formula based on SCr of 1.19 mg/dL (H)).    Allergies  Allergen Reactions  . Flexeril [Cyclobenzaprine] Hives and Swelling    Facial/lip swelling     . Levofloxacin Hives and Swelling  . Keflex [Cephalexin] Hives  . Ketorolac   . Phenergan [Promethazine Hcl] Other (See Comments)    Agitation.   . Toradol [Ketorolac Tromethamine] Swelling and Other (See Comments)    Reaction:  Facial/tongue swelling  Reaction:  Facial/tongue swelling   . Tramadol Hives, Swelling and Other (See Comments)    Reaction:  Lip swelling  Reaction:  Lip swelling   . Zoloft [Sertraline Hcl] Swelling    Tongue swelling      Antibiotics 4/27 Aztreonam x 2 4/27 Flagyl x 1 4/27 Vancomycin x 1  Microbiology 4/27 COVID (-) 4/27 BCx NGTD 4/27 UCx pending 4/27 MRSA PCR (-) 4/27 cdiff (-)   Thank you for allowing pharmacy to be a part of this patient's care.  Paticia Stack, PharmD Pharmacy Resident  10/19/2018 3:01 PM

## 2018-10-19 NOTE — Progress Notes (Addendum)
Pharmacy Antibiotic Note  Sara Munoz is a 53 y.o. female admitted on 10/19/2018 with sepsis s/t acute pyelonephritis.  Pharmacy has been consulted for vanc/aztreonam dosing.  Plan: Patient received vanc 1.75g IV load in ED Vancomycin 750 mg IV Q 24 hrs. Goal AUC 400-550. Expected AUC: 508.4 SCr used: 1.52 Cssmin: 13.8 Will continue aztreonam 2g IV q8h as patient has had a swollen tongue when taking Keflex in the past and no other documented antibiotics to switch patient has tolerated.  Height: 5\' 4"  (162.6 cm) Weight: 180 lb (81.6 kg)(Simultaneous filing. User may not have seen previous data.) IBW/kg (Calculated) : 54.7  Temp (24hrs), Avg:100.1 F (37.8 C), Min:100.1 F (37.8 C), Max:100.1 F (37.8 C)  Recent Labs  Lab 10/19/18 0128 10/19/18 0328  WBC 5.6  --   CREATININE 1.52*  --   LATICACIDVEN 3.3* 2.2*    Estimated Creatinine Clearance: 45.8 mL/min (A) (by C-G formula based on SCr of 1.52 mg/dL (H)).    Allergies  Allergen Reactions  . Flexeril [Cyclobenzaprine] Hives and Swelling    Facial/lip swelling     . Levofloxacin Hives and Swelling  . Keflex [Cephalexin] Hives  . Ketorolac   . Phenergan [Promethazine Hcl] Other (See Comments)    Agitation.   . Toradol [Ketorolac Tromethamine] Swelling and Other (See Comments)    Reaction:  Facial/tongue swelling  Reaction:  Facial/tongue swelling   . Tramadol Hives, Swelling and Other (See Comments)    Reaction:  Lip swelling  Reaction:  Lip swelling   . Zoloft [Sertraline Hcl] Swelling    Tongue swelling       Thank you for allowing pharmacy to be a part of this patient's care.  Tobie Lords, PharmD, BCPS Clinical Pharmacist 10/19/2018

## 2018-10-19 NOTE — ED Notes (Signed)
CRITICAL LAB: LACTIC is 3.3, [facility], Dr. Kerman Passey notified, orders received

## 2018-10-19 NOTE — ED Notes (Signed)
Pt encouraged to urinate, [place] operation explained

## 2018-10-19 NOTE — H&P (Addendum)
[place] [facility] [place] NAME: Sara Munoz    MR#:  [id number]  DATE OF BIRTH:  02-18-1966  DATE OF ADMISSION:  10/19/2018  PRIMARY CARE PHYSICIAN: [name], [name]   REQUESTING/REFERRING PHYSICIAN: Harvest Dark, MD  CHIEF COMPLAINT:   Chief Complaint  Patient presents with   Nausea   Abdominal Pain   Emesis   Chest Pain    HISTORY OF PRESENT ILLNESS:  Sara Munoz  is a 53 y.o. female with a known history of COPD, CAD, asthma, chronic pain with a history of migraine headache.  She presented to the emergency room complaining of 2 to 3-day history of abdominal pain with right lower abdomen and flank pain as well.  She has noted increased weakness and fatigue during this period with nausea and subjective fevers.  She denies experiencing chest pain.  However she is short of breath cough productive of clear mucus which is near her baseline given history of COPD.  Temperature on arrival is 100.1.  She has remained hypotensive with blood pressure ranging 80-107 over 50s to 60s.  She is completing 3 L normal saline bolus with very little improvement related to hypotension.  Rapid COVID-19 testing is negative.  Patient denies known sick contacts or recent travel.  She has elevated creatinine 1.52 with BUN of 20 and potassium of 2.8.  Patient has no known history of renal failure.  CT abdomen demonstrates Hazy perinephric fat stranding with striated nephrogram about the right kidney, suspicious for acute pyelonephritis.` She was started on broad-spectrum IV antibiotic therapy with Azactam, Flagyl, and vancomycin in the emergency room.  Patient is being admitted to the ICU for close monitoring as she will likely require vasopressor therapy with severe sepsis.  Blood and urine cultures are pending.  She is receiving potassium replacement therapy. PAST MEDICAL HISTORY:   Past Medical History:  Diagnosis Date   Anxiety    Asthma     Atypical chest pain 08/02/2015   CAD (coronary artery disease)    Chronic back pain    COPD (chronic obstructive pulmonary disease) (HCC)    Coronary artery disease    a. Mild-non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/2014   GERD (gastroesophageal reflux disease)    Hypercholesteremia    Hypertension    Hypokalemia    Hypomagnesemia 01/04/2014   Liver disease    MI (myocardial infarction) ([place])    Opiate use 02/27/2016   Osteoarthritis    Stroke [facility])     PAST SURGICAL HISTORY:   Past Surgical History:  Procedure Laterality Date   ANTERIOR CERVICAL DECOMP/DISCECTOMY FUSION N/A 03/30/2018   Procedure: Cervical six-seven Anterior cervical decompression/discectomy/fusion;  Surgeon: Judith Part, MD;  Location: [facility];  Service: Neurosurgery;  Laterality: N/A;   BACK SURGERY     CARDIAC CATHETERIZATION Left 02/22/2016   Procedure: Left Heart Cath and Coronary Angiography;  Surgeon: Yolonda Kida, MD;  Location: [place] [facility];  Service: Cardiovascular;  Laterality: Left;   COLONOSCOPY WITH PROPOFOL N/A 09/19/2016   Procedure: COLONOSCOPY WITH PROPOFOL;  Surgeon: [name], MD;  Location: [facility] ENDOSCOPY;  Service: Endoscopy;  Laterality: N/A;   ESOPHAGOGASTRODUODENOSCOPY (EGD) WITH PROPOFOL N/A 09/18/2017   Procedure: ESOPHAGOGASTRODUODENOSCOPY (EGD) WITH PROPOFOL;  Surgeon: [name], MD;  Location: [facility] ENDOSCOPY;  Service: Gastroenterology;  Laterality: N/A;   hemorroids     NASAL SINUS SURGERY     ORIF FEMUR FRACTURE     ORIF TIBIA & FIBULA FRACTURES  OVARY SURGERY      SOCIAL HISTORY:   Social History   Tobacco Use   Smoking status: Current Every Day Smoker    Packs/day: 0.50    Types: Cigarettes   Smokeless tobacco: Never Used  Substance Use Topics   Alcohol use: Yes    Alcohol/week: 0.0 standard drinks    FAMILY HISTORY:   Family History  Problem Relation Age of Onset   Heart disease Mother    Lung cancer Mother    Ovarian  cancer Mother    Healthy Brother    Healthy Brother    Healthy Brother    Diabetes Maternal Uncle    Breast cancer Maternal Aunt     DRUG ALLERGIES:   Allergies  Allergen Reactions   Flexeril [Cyclobenzaprine] Hives and Swelling    Facial/lip swelling      Levofloxacin Hives and Swelling   Keflex [Cephalexin] Hives   Ketorolac    Phenergan [Promethazine Hcl] Other (See Comments)    Agitation.    Toradol [Ketorolac Tromethamine] Swelling and Other (See Comments)    Reaction:  Facial/tongue swelling  Reaction:  Facial/tongue swelling    Tramadol Hives, Swelling and Other (See Comments)    Reaction:  Lip swelling  Reaction:  Lip swelling    Zoloft [Sertraline Hcl] Swelling    Tongue swelling       REVIEW OF SYSTEMS:   ROS As per history of present illness. All pertinent systems were reviewed above. Constitutional,  HEENT, cardiovascular, respiratory, GI, GU, musculoskeletal, neuro, psychiatric, endocrine,  integumentary and hematologic systems were reviewed and are otherwise  negative/unremarkable except for positive findings mentioned above in the HPI.   MEDICATIONS AT HOME:   Prior to Admission medications   Medication Sig Start Date End Date Taking? Authorizing Provider  albuterol (PROVENTIL HFA;VENTOLIN HFA) 108 (90 Base) MCG/ACT inhaler Inhale 2 puffs into the lungs every 6 (six) hours as needed for wheezing or shortness of breath. 05/26/17  Yes Carles Collet M, [name]  atorvastatin (LIPITOR) 20 MG tablet Take 1 tablet (20 mg total) by mouth daily. 07/09/18  Yes Carles Collet M, [name]  budesonide-formoterol (SYMBICORT) 160-4.5 MCG/ACT inhaler Inhale 2 puffs into the lungs 2 (two) times daily. 05/26/17  Yes Carles Collet M, [name]  docusate sodium (COLACE) 100 MG capsule Take 200 mg by mouth 2 (two) times daily as needed for mild constipation.   Yes [provider]  DULoxetine (CYMBALTA) 60 MG capsule Take 1 capsule (60 mg total) by mouth daily.  02/12/18  Yes [name], MD  gabapentin (NEURONTIN) 600 MG tablet Take 1 tablet (600 mg total) by mouth 3 (three) times daily. 06/26/17 10/19/18 Yes Pollak, Wendee Beavers, [name]  hydrochlorothiazide (HYDRODIURIL) 25 MG tablet Take 1 tablet (25 mg total) by mouth daily. 06/23/18  Yes [name], [name]  ipratropium-albuterol (DUONEB) 0.5-2.5 (3) MG/3ML SOLN Take 3 mLs by nebulization every 6 (six) hours as needed. 07/17/18  Yes Laverle Hobby, MD  lisinopril (PRINIVIL,ZESTRIL) 40 MG tablet Take 1 tablet (40 mg total) by mouth daily. 08/05/17  Yes Carles Collet M, [name]  metoprolol tartrate (LOPRESSOR) 25 MG tablet Take 1 tablet (25 mg total) by mouth 2 (two) times daily. 08/17/18  Yes Carles Collet M, [name]  Naloxone HCl (NARCAN IJ) Inject as directed as needed.   Yes [provider]  nitroGLYCERIN (NITROSTAT) 0.4 MG SL tablet Place 1 tablet (0.4 mg total) under the tongue every 5 (five) minutes x 3 doses as needed for chest pain. 08/03/15  Yes Demetrios Loll, MD  omeprazole (PRILOSEC) 40 MG capsule Take 1 capsule (40 mg total) by mouth daily. 05/19/18  Yes [name], MD  ondansetron (ZOFRAN-ODT) 8 MG disintegrating tablet Take 1 tablet (8 mg total) by mouth 2 (two) times daily. 02/12/18  Yes [name], MD  Oxycodone HCl 10 MG TABS Take 10 mg by mouth 4 (four) times daily.  06/19/18  Yes [provider]  rizatriptan (MAXALT-MLT) 10 MG disintegrating tablet Take 1 tablet (10 mg total) by mouth as needed. May repeat in 2 hours if needed 02/12/18  Yes [name], MD  SPIRIVA RESPIMAT 2.5 MCG/ACT AERS TAKE 1 PUFF BY MOUTH EVERY DAY 09/21/18  Yes Carles Collet M, [name]  sucralfate (CARAFATE) 1 GM/10ML suspension Take 10 mLs (1 g total) by mouth 4 (four) times daily. 05/19/18  Yes [name], MD  tiZANidine (ZANAFLEX) 4 MG tablet Take 4 mg by mouth daily.    Yes [provider]      VITAL SIGNS:  Blood pressure 99/60, pulse (!) 115, temperature 100.1 F (37.8 C), temperature  source Oral, resp. rate (!) 30, height 5\' 4"  (1.626 m), weight 81.6 kg, SpO2 97 %.  PHYSICAL EXAMINATION:  Physical Exam Vitals signs and nursing note reviewed.  Constitutional:      Appearance: She is ill-appearing.  HENT:     Head: Normocephalic.     Mouth/Throat:     Mouth: Mucous membranes are moist.  Eyes:     General: No scleral icterus.    Pupils: Pupils are equal, round, and reactive to light.  Cardiovascular:     Rate and Rhythm: Normal rate and regular rhythm.     Heart sounds: Normal heart sounds. No murmur.  Pulmonary:     Effort: Pulmonary effort is normal.     Breath sounds: Wheezing and rhonchi present.  Chest:     Chest wall: No tenderness.  Abdominal:     General: Bowel sounds are normal. There is no distension.     Palpations: Abdomen is soft. There is no mass.     Tenderness: There is abdominal tenderness in the right lower quadrant and suprapubic area. There is right CVA tenderness. There is no left CVA tenderness, guarding or rebound.  Skin:    General: Skin is warm and dry.     Capillary Refill: Capillary refill takes less than 2 seconds.     Findings: No erythema or rash.  Neurological:     Mental Status: She is alert and oriented to person, place, and time.  Psychiatric:        Behavior: Behavior normal.       LABORATORY PANEL:   CBC Recent Labs  Lab 10/19/18 0128  WBC 5.6  HGB 14.9  HCT 45.6  PLT 123*   ------------------------------------------------------------------------------------------------------------------  Chemistries  Recent Labs  Lab 10/19/18 0128  NA 137  K 2.8*  CL 99  CO2 22  GLUCOSE 108*  BUN 20  CREATININE 1.52*  CALCIUM 8.7*  AST 37  ALT 21  ALKPHOS 113  BILITOT 1.1   ------------------------------------------------------------------------------------------------------------------  Cardiac Enzymes No results for input(s): TROPONINI in the last 168  hours. ------------------------------------------------------------------------------------------------------------------  RADIOLOGY:  Ct Abdomen Pelvis W Contrast  Result Date: 10/19/2018 CLINICAL DATA:  Initial evaluation for acute nausea, vomiting, generalized abdominal pain. EXAM: CT ABDOMEN AND PELVIS WITH CONTRAST TECHNIQUE: Multidetector CT imaging of the abdomen and pelvis was performed using the standard protocol following bolus administration of intravenous contrast. CONTRAST:  66mL OMNIPAQUE IOHEXOL 300  MG/ML  SOLN COMPARISON:  Prior CT from 06/09/2018 FINDINGS: Lower chest: Scattered subsegmental atelectatic changes seen dependently within the visualized lung bases. Visualized lungs are otherwise clear. Coronary artery calcifications partially visualized. Hepatobiliary: Liver demonstrates a normal contrast enhanced appearance. Gallbladder within normal limits. No biliary dilatation. Pancreas: Pancreas within normal limits. Spleen: Spleen within normal limits. Adrenals/Urinary Tract: Adrenal glands are normal. Right kidney slightly enlarged as compared to the left with associated perinephric fat stranding. Mild stranding extends about the proximal right ureter. No obstructive stone identified. Striated nephrogram seen on delayed sequence. Findings raise the possibility for possible pyelonephritis. No collection or other complication. No other radiopaque calculi seen within either kidney or along the course of either renal collecting system. No focal enhancing renal mass. Partially distended bladder within normal limits. No layering stones within the bladder lumen. Stomach/Bowel: Stomach demonstrates no acute finding. Probable small diverticulum noted at the gastric fundus. No evidence for bowel obstruction. Normal appendix. No acute inflammatory changes seen about the bowels. Vascular/Lymphatic: Moderate aorto bi-iliac atherosclerotic disease. No aneurysm. Mesenteric vessels patent proximally. No  adenopathy. Reproductive: Uterus and ovaries within normal limits. Other: No free air or fluid. Musculoskeletal: Fixation rod partially visualize within the left femur. No acute osseous finding. No discrete lytic or blastic osseous lesions. IMPRESSION: 1. Hazy perinephric fat stranding with striated nephrogram about the right kidney, suspicious for acute pyelonephritis. Correlation with urinalysis recommended. No obstructive stone, collection, or other complication identified. 2. Moderate aortic atherosclerosis with coronary artery calcifications. Electronically Signed   By: Jeannine Boga M.D.   On: 10/19/2018 03:33   Dg Chest Port 1 View  Result Date: 10/19/2018 CLINICAL DATA:  53 year old female with weakness, shortness of breath, fever. EXAM: PORTABLE CHEST 1 VIEW COMPARISON:  06/13/2018 and earlier. FINDINGS: Portable AP upright view at 0057 hours. Lower lung volumes. Mediastinal contours are stable and within normal limits. Visualized tracheal air column is within normal limits. Allowing for portable technique the lungs are clear. No pneumothorax. Prior cervical ACDF. No acute osseous abnormality identified. Paucity of bowel gas in the upper abdomen. IMPRESSION: Lower lung volumes.  No acute cardiopulmonary abnormality. Electronically Signed   By: Genevie Ann M.D.   On: 10/19/2018 01:49      IMPRESSION AND PLAN:  1.  Severe sepsis with septic shock -She is being admitted to the ICU for close monitoring as she will likely require vasopressor therapy - Broad-spectrum IV antibiotic therapy with Azactam, Flagyl, and vancomycin initiated - Blood and urine cultures are pending.  We will follow-up results and adjust therapy as indicated. - Patient has received 3 L normal saline fluid bolus.  We will continue normal saline at 125 cc/h to peripheral IV. - Will start patient on vasopressor therapy with Levophed for M AP less than 65.  2.  Acute pyelonephritis - Broad-spectrum IV antibiotic therapy  initiated as discussed above - Blood and urine cultures are pending.  Will adjust therapy accordingly  3.  Acute kidney injury - Continue to monitor renal function closely and consult nephrology if no improvement.  Repeat BMP in the a.m.  4.  Hypokalemia - Patient receiving IV potassium replacement - Repeat potassium level in the a.m. and replace as needed - We will obtain magnesium level planning to replace if less than 2.  5.  Abdominal pain/right flank pain - Will treat with IV analgesics  6.  COPD - Spiriva has been restarted - She will have PRN DuoNeb for shortness of breath and cough - She has  O2 at 2 L per nasal cannula  Patient has been started on Lovenox for DVT prophylaxis.  We will adjust her plan of care based on her clinical course    All the records are reviewed and case discussed with ED provider. The plan of care was discussed in details with the patient (and family). I answered all questions. The patient agreed to proceed with the above mentioned plan. Further management will depend upon hospital course.   CODE STATUS: Full code  TOTAL TIME TAKING CARE OF THIS PATIENT: 45 minutes.    [place] on 10/19/2018 at 4:49 AM  Pager - [phone]  After 6pm go to [email or web address] - password [email or web address] [facility]  [facility]  Office  [phone]  CC: Primary care physician; Paulene Floor  I have seen and examined the patient with my Nurse practitioner.  I have discussed the case with her and agree with the note above.  The patient presents with recurrent nausea and vomiting over the last 4 days with right flank pain as well as urinary frequency without dysuria or hematuria.  She has been having low-grade fever with a T-max of 100.1 here.  She admits to mild dyspnea and cough productive of clear sputum as well as chest pain. No recent sick exposure or exposure to large crowds.  No recent travels out of the state or out of the  country or to a COVID-19 epicenter. Upon physical examination: She was still hypotensive with a systolic blood pressure of 90/58 manually close to the end of her third liters of IV normal saline. Generally: She was acutely ill and uncomfortable from back pain in no acute respiratory distress Cardiovascular: Regular rate and rhythm with normal S1-S2 no murmurs gallops or rubs. Respiratory: Clear to auscultation bilaterally Abdomen: Soft nontender with positive bowel sounds and no palpable endometrial masses. Extremities: No edema clubbing or cyanosis.  Labs and radiographic studies: All reviewed.  Assessment/plan: Severe sepsis with septic shock secondary to acute pyelonephritis.  The patient will be admitted to an ICU bed.  We started her on IV Levophed and will continue her asked him vancomycin and Flagyl pending further blood cultures as well as urine culture and sensitivity.  Will obtain chest x-ray given her respiratory symptoms and follow serial enzymes.  Continue aggressive hydration with IV normal saline.  For further details please refer to dictated admission H&P above.  I agree with the rest of the plan of care as delineated by my nurse practitioner.  Advanced care planning: I discussed the CODE STATUS in details with the patient and she desires to be full code.    Note: This dictation was prepared with Dragon dictation along with smaller phrase technology. Any transcriptional errors that result from this process are unintentional.

## 2018-10-19 NOTE — ED Notes (Signed)
Bladder scan 356ml, pt reports "I don't have to go"

## 2018-10-19 NOTE — ED Notes (Signed)
Pt reports unable to tolerate contrast or any drinking

## 2018-10-19 NOTE — ED Notes (Signed)
Pt only able to drink approx 200 mls contrast - still nauseous, CT called

## 2018-10-19 NOTE — Consult Note (Signed)
CRITICAL CARE NOTE      CHIEF COMPLAINT:   Septic shock  HPI   This is a pleasant 53 year old female with a history of COPD, asthma, CAD chronic pain syndrome on OxyContin multiple times daily, she was admitted to ED with Abdominal pain X3 d, as well as febrile episodes and feeling malaise over the past week.  She denies chest pain, chest discomfort, worsening shortness of breath although she does have a history of COPD.  In the ED she was found to have hypotension requiring Levophed infusion.  She was also found to have an AKI stage II, hypokalemia, hypomagnesemia, anemia and low-grade leukocytosis at 10.9, lactate was 2.2 on admission.  Patient had received 3 L of fluid and was nonresponsive remaining hypotensive.  CT of the abdomen was done showing striated nephrogram at right kidney suspicious for acute pyelonephritis.  Patient had novel coronavirus testing done which was negative.    PAST MEDICAL HISTORY   Past Medical History:  Diagnosis Date  . Anxiety   . Asthma   . Atypical chest pain 08/02/2015  . CAD (coronary artery disease)   . Chronic back pain   . COPD (chronic obstructive pulmonary disease) ([place])   . Coronary artery disease    a. Mild-non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/2014  . GERD (gastroesophageal reflux disease)   . Hypercholesteremia   . Hypertension   . Hypokalemia   . Hypomagnesemia 01/04/2014  . Liver disease   . MI (myocardial infarction) ([place])   . Opiate use 02/27/2016  . Osteoarthritis   . Stroke [facility])      SURGICAL HISTORY   Past Surgical History:  Procedure Laterality Date  . ANTERIOR CERVICAL DECOMP/DISCECTOMY FUSION N/A 03/30/2018   Procedure: Cervical six-seven Anterior cervical decompression/discectomy/fusion;  Surgeon: Judith Part, MD;  Location: [place];  Service:  Neurosurgery;  Laterality: N/A;  . BACK SURGERY    . CARDIAC CATHETERIZATION Left 02/22/2016   Procedure: Left Heart Cath and Coronary Angiography;  Surgeon: Yolonda Kida, MD;  Location: [place] CV LAB;  Service: Cardiovascular;  Laterality: Left;  . COLONOSCOPY WITH PROPOFOL N/A 09/19/2016   Procedure: COLONOSCOPY WITH PROPOFOL;  Surgeon: [name], MD;  Location: [facility] ENDOSCOPY;  Service: Endoscopy;  Laterality: N/A;  . ESOPHAGOGASTRODUODENOSCOPY (EGD) WITH PROPOFOL N/A 09/18/2017   Procedure: ESOPHAGOGASTRODUODENOSCOPY (EGD) WITH PROPOFOL;  Surgeon: [name], MD;  Location: [facility] ENDOSCOPY;  Service: Gastroenterology;  Laterality: N/A;  . hemorroids    . NASAL SINUS SURGERY    . ORIF FEMUR FRACTURE    . ORIF TIBIA & FIBULA FRACTURES    . OVARY SURGERY       FAMILY HISTORY   Family History  Problem Relation Age of Onset  . Heart disease Mother   . Lung cancer Mother   . Ovarian cancer Mother   . Healthy Brother   . Healthy Brother   . Healthy Brother   . Diabetes Maternal Uncle   . Breast cancer Maternal Aunt      SOCIAL HISTORY   Social History   Tobacco Use  . Smoking status: Current Every Day Smoker    Packs/day: 0.50    Types: Cigarettes  . Smokeless tobacco: Never Used  Substance Use Topics  . Alcohol use: Yes    Alcohol/week: 0.0 standard drinks  . Drug use: Yes    Types: Marijuana    Comment: 2 months ago      MEDICATIONS   Current Medication:  Current Facility-Administered Medications:  .  0.9 %  sodium chloride infusion, , Intravenous, Continuous, Seals, Theo Dills, NP, Last Rate: 125 mL/hr at 10/19/18 0535 .  acetaminophen (TYLENOL) tablet 650 mg, 650 mg, Oral, Q6H PRN, 650 mg at 10/19/18 0530 **OR** acetaminophen (TYLENOL) suppository 650 mg, 650 mg, Rectal, Q6H PRN, Seals, Angela H, NP .  atorvastatin (LIPITOR) tablet 20 mg, 20 mg, Oral, Daily, Seals, Angela H, NP .  aztreonam (AZACTAM) 2 g in sodium chloride 0.9 % 100 mL IVPB, 2 g,  Intravenous, Q8H, Seals, Angela H, NP .  docusate sodium (COLACE) capsule 200 mg, 200 mg, Oral, BID PRN, Seals, Theo Dills, NP .  DULoxetine (CYMBALTA) DR capsule 60 mg, 60 mg, Oral, Daily, Seals, Angela H, NP .  enoxaparin (LOVENOX) injection 40 mg, 40 mg, Subcutaneous, Q24H, Seals, Angela H, NP, 40 mg at 10/19/18 0532 .  ipratropium-albuterol (DUONEB) 0.5-2.5 (3) MG/3ML nebulizer solution 3 mL, 3 mL, Nebulization, Q6H, Seals, Angela H, NP .  metroNIDAZOLE (FLAGYL) IVPB 500 mg, 500 mg, Intravenous, Q8H, Paticia Stack, RPH .  mometasone-formoterol (DULERA) 200-5 MCG/ACT inhaler 2 puff, 2 puff, Inhalation, BID, Seals, Angela H, NP .  norepinephrine (LEVOPHED) 4mg  in 229mL premix infusion, 0-40 mcg/min, Intravenous, Titrated, Mansy, Arvella Merles, MD, Stopped at 10/19/18 [phone] .  ondansetron (ZOFRAN) tablet 4 mg, 4 mg, Oral, Q6H PRN **OR** ondansetron (ZOFRAN) injection 4 mg, 4 mg, Intravenous, Q6H PRN, Seals, Angela H, NP .  oxyCODONE-acetaminophen (PERCOCET/ROXICET) 5-325 MG per tablet 1 tablet, 1 tablet, Oral, Q6H PRN, Seals, Angela H, NP .  pantoprazole (PROTONIX) EC tablet 40 mg, 40 mg, Oral, Daily, Seals, Angela H, NP .  potassium chloride (KLOR-CON) packet 40 mEq, 40 mEq, Oral, Once, Mansy, Jan A, MD .  sucralfate (CARAFATE) 1 GM/10ML suspension 1 g, 1 g, Oral, QID, Seals, Angela H, NP .  tiotropium (SPIRIVA) inhalation capsule ([facility] use ONLY) 18 mcg, 1 capsule, Inhalation, q morning - 10a, Seals, Theo Dills, NP .  [START ON 10/20/2018] vancomycin (VANCOCIN) IVPB 750 mg/150 ml premix, 750 mg, Intravenous, Q24H, Mansy, Arvella Merles, MD    ALLERGIES   Flexeril [cyclobenzaprine]; Levofloxacin; Keflex [cephalexin]; Ketorolac; Phenergan [promethazine hcl]; Toradol [ketorolac tromethamine]; Tramadol; and Zoloft [sertraline hcl]    REVIEW OF SYSTEMS     10 point ROS conducted and is negative except as per HPI  PHYSICAL EXAMINATION   Vitals:   10/19/18 0743 10/19/18 0807  BP:  116/83  Pulse: (!) 115  (!) 109  Resp: (!) 23 (!) 26  Temp:  98 F (36.7 C)  SpO2: 94% 97%    GENERAL: Mild distress due to acute illness HEAD: Normocephalic, atraumatic.  EYES: Pupils equal, round, reactive to light.  No scleral icterus.  MOUTH: Moist mucosal membrane. NECK: Supple. No thyromegaly. No nodules. No JVD.  PULMONARY: Creased breath sounds at the bases without rhonchorous breath sounds or wheezing bilaterally CARDIOVASCULAR: S1 and S2. Regular rate and rhythm. No murmurs, rubs, or gallops.  GASTROINTESTINAL: Soft, nontender, non-distended. No masses. Positive bowel sounds. No hepatosplenomegaly.  MUSCULOSKELETAL: No swelling, clubbing, or edema.  NEUROLOGIC: Mild distress due to acute illness SKIN:intact,warm,dry   LABS AND IMAGING     -I personally reviewed most recent blood work, imaging and microbiology - significant findings today are hypokalemia, hypomagnesemia, anemia, AKI, leukocytosis  LAB RESULTS: Recent Labs  Lab 10/19/18 0128 10/19/18 0650  NA 137 138  K 2.8* 3.5  CL 99 110  CO2 22 20*  BUN 20 20  CREATININE 1.52* 1.19*  GLUCOSE 108* 109*  Recent Labs  Lab 10/19/18 0128 10/19/18 0650  HGB 14.9 11.2*  HCT 45.6 33.9*  WBC 5.6 10.9*  PLT 123* 100*     IMAGING RESULTS: Ct Abdomen Pelvis W Contrast  Result Date: 10/19/2018 CLINICAL DATA:  Initial evaluation for acute nausea, vomiting, generalized abdominal pain. EXAM: CT ABDOMEN AND PELVIS WITH CONTRAST TECHNIQUE: Multidetector CT imaging of the abdomen and pelvis was performed using the standard protocol following bolus administration of intravenous contrast. CONTRAST:  18mL OMNIPAQUE IOHEXOL 300 MG/ML  SOLN COMPARISON:  Prior CT from 06/09/2018 FINDINGS: Lower chest: Scattered subsegmental atelectatic changes seen dependently within the visualized lung bases. Visualized lungs are otherwise clear. Coronary artery calcifications partially visualized. Hepatobiliary: Liver demonstrates a normal contrast enhanced  appearance. Gallbladder within normal limits. No biliary dilatation. Pancreas: Pancreas within normal limits. Spleen: Spleen within normal limits. Adrenals/Urinary Tract: Adrenal glands are normal. Right kidney slightly enlarged as compared to the left with associated perinephric fat stranding. Mild stranding extends about the proximal right ureter. No obstructive stone identified. Striated nephrogram seen on delayed sequence. Findings raise the possibility for possible pyelonephritis. No collection or other complication. No other radiopaque calculi seen within either kidney or along the course of either renal collecting system. No focal enhancing renal mass. Partially distended bladder within normal limits. No layering stones within the bladder lumen. Stomach/Bowel: Stomach demonstrates no acute finding. Probable small diverticulum noted at the gastric fundus. No evidence for bowel obstruction. Normal appendix. No acute inflammatory changes seen about the bowels. Vascular/Lymphatic: Moderate aorto bi-iliac atherosclerotic disease. No aneurysm. Mesenteric vessels patent proximally. No adenopathy. Reproductive: Uterus and ovaries within normal limits. Other: No free air or fluid. Musculoskeletal: Fixation rod partially visualize within the left femur. No acute osseous finding. No discrete lytic or blastic osseous lesions. IMPRESSION: 1. Hazy perinephric fat stranding with striated nephrogram about the right kidney, suspicious for acute pyelonephritis. Correlation with urinalysis recommended. No obstructive stone, collection, or other complication identified. 2. Moderate aortic atherosclerosis with coronary artery calcifications. Electronically Signed   By: Jeannine Boga M.D.   On: 10/19/2018 03:33   Dg Chest Port 1 View  Result Date: 10/19/2018 CLINICAL DATA:  53 year old female with weakness, shortness of breath, fever. EXAM: PORTABLE CHEST 1 VIEW COMPARISON:  06/13/2018 and earlier. FINDINGS: Portable AP  upright view at 0057 hours. Lower lung volumes. Mediastinal contours are stable and within normal limits. Visualized tracheal air column is within normal limits. Allowing for portable technique the lungs are clear. No pneumothorax. Prior cervical ACDF. No acute osseous abnormality identified. Paucity of bowel gas in the upper abdomen. IMPRESSION: Lower lung volumes.  No acute cardiopulmonary abnormality. Electronically Signed   By: Genevie Ann M.D.   On: 10/19/2018 01:49      ASSESSMENT AND PLAN    -Multidisciplinary rounds held today   Septic shock      -due to acute right pyelonephritis  - weaning vasopressors now  -urine culture pending.  -on flagyl and azactam empirically- will narrow to cefepime as per pharmD - appreciate input.   -use vasopressors to keep MAP>65 -follow ABG and LA -follow up cultures -emperic ABX -consider stress dose steroids  Acute diarreah      -liquid stool - testing for cdiff   CARDIAC FAILURE-        On levophed on admission  -oxygen as needed -follow up cardiac enzymes as indicated- troponin mildly elevated likely demand ischemia vs AKI related ICU monitoring   Renal Failure-AKI KDIGO stage2    Secondary to  transient hypotension  -follow chem 7 -follow UO -continue Foley Catheter-assess need daily   COPD without exacerbation      -COPD carepath     -BPH with IS     - DuoNebs q6h    ID -continue IV abx as prescibed -follow up cultures  GI/Nutrition GI PROPHYLAXIS as indicated DIET-->TF's as tolerated Constipation protocol as indicated  ENDO - ICU hypoglycemic\Hyperglycemia protocol -check FSBS per protocol   ELECTROLYTES -follow labs as needed -replace as needed -pharmacy consultation   DVT/GI PRX ordered -SCDs  TRANSFUSIONS AS NEEDED MONITOR FSBS ASSESS the need for LABS as needed   Critical care provider statement:    Critical care time (minutes):  36   Critical care time was exclusive of:  Separately billable  procedures and treating other patients   Critical care was necessary to treat or prevent imminent or life-threatening deterioration of the following conditions:  Septic shock, acute pyelonephritis, COPD, AKI, anemia, abdominal pain   Critical care was time spent personally by me on the following activities:  Development of treatment plan with patient or surrogate, discussions with consultants, evaluation of patient's response to treatment, examination of patient, obtaining history from patient or surrogate, ordering and performing treatments and interventions, ordering and review of laboratory studies and re-evaluation of patient's condition.  I assumed direction of critical care for this patient from another provider in my specialty: no    This document was prepared using Dragon voice recognition software and may include unintentional dictation errors.    Ottie Glazier, M.D.  Division of [place]

## 2018-10-20 ENCOUNTER — Telehealth: Payer: Self-pay | Admitting: Physician Assistant

## 2018-10-20 LAB — BASIC METABOLIC PANEL
Anion gap: 8 (ref 5–15)
BUN: 16 mg/dL (ref 6–20)
CO2: 19 mmol/L — ABNORMAL LOW (ref 22–32)
Calcium: 7.7 mg/dL — ABNORMAL LOW (ref 8.9–10.3)
Chloride: 112 mmol/L — ABNORMAL HIGH (ref 98–111)
Creatinine, Ser: 0.85 mg/dL (ref 0.44–1.00)
GFR calc Af Amer: >60 mL/min (ref >60)
GFR calc non Af Amer: >60 mL/min (ref >60)
Glucose, Bld: 113 mg/dL — ABNORMAL HIGH (ref 70–99)
Potassium: 3.5 mmol/L (ref 3.5–5.1)
Sodium: 139 mmol/L (ref 135–145)

## 2018-10-20 LAB — CBC WITH DIFFERENTIAL/PLATELET
Abs Immature Granulocytes: 0.89 10*3/uL — ABNORMAL HIGH (ref 0.00–0.07)
Basophils Absolute: 0 10*3/uL (ref 0.0–0.1)
Basophils Relative: 0 %
Eosinophils Absolute: 0 10*3/uL (ref 0.0–0.5)
Eosinophils Relative: 0 %
HCT: 33.2 % — ABNORMAL LOW (ref 36.0–46.0)
Hemoglobin: 10.7 g/dL — ABNORMAL LOW (ref 12.0–15.0)
Immature Granulocytes: 11 %
Lymphocytes Relative: 6 %
Lymphs Abs: 0.5 10*3/uL — ABNORMAL LOW (ref 0.7–4.0)
MCH: 29.1 pg (ref 26.0–34.0)
MCHC: 32.2 g/dL (ref 30.0–36.0)
MCV: 90.2 fL (ref 80.0–100.0)
Monocytes Absolute: 0.3 10*3/uL (ref 0.1–1.0)
Monocytes Relative: 3 %
Neutro Abs: 6.8 10*3/uL (ref 1.7–7.7)
Neutrophils Relative %: 80 %
Platelets: 77 10*3/uL — ABNORMAL LOW (ref 150–400)
RBC: 3.68 MIL/uL — ABNORMAL LOW (ref 3.87–5.11)
RDW: 18.6 % — ABNORMAL HIGH (ref 11.5–15.5)
WBC: 8.5 10*3/uL (ref 4.0–10.5)
nRBC: 0 % (ref 0.0–0.2)

## 2018-10-20 LAB — BLOOD CULTURE ID PANEL (REFLEXED)

## 2018-10-20 LAB — PROCALCITONIN: Procalcitonin: 9.43 ng/mL

## 2018-10-20 LAB — HIV ANTIBODY (ROUTINE TESTING W REFLEX): HIV Screen 4th Generation wRfx: NONREACTIVE

## 2018-10-20 LAB — MAGNESIUM: Magnesium: 2 mg/dL (ref 1.7–2.4)

## 2018-10-20 MED ORDER — NICOTINE 21 MG/24HR TD PT24
21.0000 mg | MEDICATED_PATCH | Freq: Every day | TRANSDERMAL | Status: DC
  Administered 2018-10-20 – 2018-10-22 (×3): 21 mg via TRANSDERMAL
  Filled 2018-10-20 (×3): qty 1

## 2018-10-20 MED ORDER — GUAIFENESIN 100 MG/5ML PO SOLN
5.0000 mL | ORAL | Status: DC | PRN
  Administered 2018-10-20 (×2): 100 mg via ORAL
  Filled 2018-10-20 (×4): qty 5

## 2018-10-20 MED ORDER — IPRATROPIUM-ALBUTEROL 0.5-2.5 (3) MG/3ML IN SOLN
3.0000 mL | Freq: Four times a day (QID) | RESPIRATORY_TRACT | Status: DC | PRN
  Administered 2018-10-20: 3 mL via RESPIRATORY_TRACT
  Filled 2018-10-20: qty 3

## 2018-10-20 MED ORDER — METOPROLOL TARTRATE 25 MG PO TABS
12.5000 mg | ORAL_TABLET | Freq: Two times a day (BID) | ORAL | Status: DC
  Administered 2018-10-20 – 2018-10-21 (×4): 12.5 mg via ORAL
  Filled 2018-10-20 (×4): qty 1

## 2018-10-20 MED ORDER — IPRATROPIUM-ALBUTEROL 0.5-2.5 (3) MG/3ML IN SOLN
3.0000 mL | Freq: Three times a day (TID) | RESPIRATORY_TRACT | Status: DC
  Administered 2018-10-20 – 2018-10-22 (×5): 3 mL via RESPIRATORY_TRACT
  Filled 2018-10-20 (×6): qty 3

## 2018-10-20 MED ORDER — SODIUM CHLORIDE 0.9 % IV SOLN
1.0000 g | Freq: Three times a day (TID) | INTRAVENOUS | Status: DC
  Administered 2018-10-20 – 2018-10-21 (×4): 1 g via INTRAVENOUS
  Filled 2018-10-20 (×6): qty 1

## 2018-10-20 NOTE — Progress Notes (Signed)
Report given to [employer] on [place]. Voiced no questions or concerns. Will transfer care at this time. Patient remains alert and orientedX4.

## 2018-10-20 NOTE — Telephone Encounter (Signed)
done

## 2018-10-20 NOTE — Progress Notes (Signed)
PHARMACY - PHYSICIAN COMMUNICATION CRITICAL VALUE ALERT - BLOOD CULTURE IDENTIFICATION (BCID)  Sara Munoz is an 53 y.o. female who presented to [facility] on 10/19/2018 with a chief complaint of N/V and abdominal pain   Assessment:  Patient being treated for septic shock secondary to pyelonephritis.  BCID now showing E. Coli, KPC (-)   Name of physician (or Provider) Contacted: Marda Stalker, NP  Current antibiotics: Cefepime   Changes to prescribed antibiotics:  Abx will be changed to meropenem and narrowed once sensitivities are available   Results for orders placed or performed during the hospital encounter of 10/19/18  Blood Culture ID Panel (Reflexed) (Collected: 10/19/2018  1:28 AM)  Result Value Ref Range   Enterococcus species NOT DETECTED NOT DETECTED   Listeria monocytogenes NOT DETECTED NOT DETECTED   Staphylococcus species NOT DETECTED NOT DETECTED   Staphylococcus aureus (BCID) NOT DETECTED NOT DETECTED   Streptococcus species NOT DETECTED NOT DETECTED   Streptococcus agalactiae NOT DETECTED NOT DETECTED   Streptococcus pneumoniae NOT DETECTED NOT DETECTED   Streptococcus pyogenes NOT DETECTED NOT DETECTED   Acinetobacter baumannii NOT DETECTED NOT DETECTED   Enterobacteriaceae species DETECTED (A) NOT DETECTED   Enterobacter cloacae complex NOT DETECTED NOT DETECTED   Escherichia coli DETECTED (A) NOT DETECTED   Klebsiella oxytoca NOT DETECTED NOT DETECTED   Klebsiella pneumoniae NOT DETECTED NOT DETECTED   Proteus species NOT DETECTED NOT DETECTED   Serratia marcescens NOT DETECTED NOT DETECTED   Carbapenem resistance NOT DETECTED NOT DETECTED   Haemophilus influenzae NOT DETECTED NOT DETECTED   Neisseria meningitidis NOT DETECTED NOT DETECTED   Pseudomonas aeruginosa NOT DETECTED NOT DETECTED   Candida albicans NOT DETECTED NOT DETECTED   Candida glabrata NOT DETECTED NOT DETECTED   Candida krusei NOT DETECTED NOT DETECTED   Candida parapsilosis NOT DETECTED  NOT DETECTED   Candida tropicalis NOT DETECTED NOT [place], PharmD, BCPS Clinical Pharmacist 10/20/2018 3:07 AM

## 2018-10-20 NOTE — Progress Notes (Signed)
MD notified: Can you order fioricet for migrane. I had to increase her oxygem to 2.5l since her oxygen kept on dropping as well.

## 2018-10-20 NOTE — Plan of Care (Signed)
  Problem: Education: Goal: Knowledge of General Education information will improve Description Including pain rating scale, medication(s)/side effects and non-pharmacologic comfort measures Outcome: Progressing   Problem: Health Behavior/Discharge Planning: Goal: Ability to manage health-related needs will improve Outcome: Progressing   Problem: Clinical Measurements: Goal: Ability to maintain clinical measurements within normal limits will improve Outcome: Progressing Goal: Will remain free from infection Outcome: Progressing Goal: Diagnostic test results will improve Outcome: Progressing Goal: Respiratory complications will improve Outcome: Progressing Goal: Cardiovascular complication will be avoided Outcome: Progressing   Problem: Activity: Goal: Risk for activity intolerance will decrease Outcome: Progressing   Problem: Nutrition: Goal: Adequate nutrition will be maintained Outcome: Progressing   Problem: Coping: Goal: Level of anxiety will decrease Outcome: Progressing   Problem: Pain Managment: Goal: General experience of comfort will improve Outcome: Progressing   Problem: Safety: Goal: Ability to remain free from injury will improve Outcome: Progressing   Problem: Skin Integrity: Goal: Risk for impaired skin integrity will decrease Outcome: Progressing   Problem: Fluid Volume: Goal: Hemodynamic stability will improve Outcome: Progressing   Problem: Clinical Measurements: Goal: Signs and symptoms of infection will decrease Outcome: Progressing   Problem: Respiratory: Goal: Ability to maintain adequate ventilation will improve Outcome: Progressing

## 2018-10-20 NOTE — TOC Initial Note (Signed)
Transition of Care [facility]) - Initial/Assessment Note    Patient Details  Name: Sara Munoz MRN: [id number] Date of Birth: 1965/07/24  Transition of Care [facility]) CM/SW Contact:    Shela Leff, LCSW Phone Number: 10/20/2018, 4:04 PM  Clinical Narrative:                CSW met with patient due to high risk readmit score. Patient was pleasant to speak with and states she lives alone but her boyfriend will stay with her from time to time. She stated that she has a walker if she needs one, but she states that she does not need to use it at this time. She reports having a primary care with [facility]. She states she relies on friends for transportation to locations such as grocery stores, etc. She uses medicaid transportation for doctor's appointments. She tells CSW that she is independent at baselines with her ADL's. No needs are foreseen at this time, but CSW will continue to follow.   Expected Discharge Plan: Home/Self Care Barriers to Discharge: No Barriers Identified   Patient Goals and CMS Choice        Expected Discharge Plan and Services Expected Discharge Plan: Home/Self Care       Living arrangements for the past 2 months: Single Family Home                                      Prior Living Arrangements/Services Living arrangements for the past 2 months: Single Family Home Lives with:: Self Patient language and need for interpreter reviewed:: Yes Do you feel safe going back to the place where you live?: Yes      Need for Family Participation in Patient Care: No (Comment) Care giver support system in place?: Yes (comment) Current home services: DME Criminal Activity/Legal Involvement Pertinent to Current Situation/Hospitalization: No - Comment as needed  Activities of Daily Living Home Assistive Devices/Equipment: Gilford Rile (specify type) ADL Screening (condition at time of admission) Patient's cognitive ability adequate to safely complete daily  activities?: Yes Is the patient deaf or have difficulty hearing?: No Does the patient have difficulty seeing, even when wearing glasses/contacts?: No Does the patient have difficulty concentrating, remembering, or making decisions?: No Patient able to express need for assistance with ADLs?: Yes Does the patient have difficulty dressing or bathing?: No Independently performs ADLs?: Yes (appropriate for developmental age) Does the patient have difficulty walking or climbing stairs?: Yes Weakness of Legs: Both Weakness of Arms/Hands: None  Permission Sought/Granted                  Emotional Assessment Appearance:: Appears stated age Attitude/Demeanor/Rapport: (pleasant and cooperative) Affect (typically observed): Accepting, Calm Orientation: : Oriented to Self, Oriented to Place, Oriented to  Time, Oriented to Situation Alcohol / Substance Use: Not Applicable Psych Involvement: No (comment)  Admission diagnosis:  Lower urinary tract infectious disease [N39.0] Pyelonephritis [N12] Sepsis, due to unspecified organism, unspecified whether acute organ dysfunction present ([place]) [A41.9] Severe sepsis ([place]) [A41.9, R65.20] Patient Active Problem List   Diagnosis Date Noted  . Sepsis ([place]) 10/19/2018  . Severe sepsis ([place]) 10/19/2018  . Cervical radiculopathy 02/12/2018  . Chronic migraine 02/12/2018  . Paresthesia 08/29/2017  . Neck pain 08/29/2017  . Daily headache 08/29/2017  . Chronic active hepatitis ([place]) 07/29/2017  . Neurogenic pain 07/31/2016  . Chronic pain syndrome 06/05/2016  . Carotid artery stenosis  02/27/2016  . Chronic constipation 02/27/2016  . Chronic nausea 02/27/2016  . Hypercholesterolemia 02/27/2016  . Osteoarthritis 02/27/2016  . PTSD (post-traumatic stress disorder) 02/27/2016  . TIA (transient ischemic attack) 02/27/2016  . Long term current use of opiate analgesic 02/27/2016  . Long term prescription opiate use 02/27/2016  . Encounter for pain  management consult 02/27/2016  . Chronic hip pain (Location of Tertiary source of pain) (Bilateral) (L>R) 02/27/2016  . Chronic knee pain (Bilateral) (L>R) 02/27/2016  . Chronic shoulder pain (Bilateral) (L>R) 02/27/2016  . Chronic sacroiliac joint pain (Bilateral) (L>R) 02/27/2016  . Chronic low back pain (Location of Primary Source of Pain) (Bilateral) (L>R) 02/27/2016  . Chronic lower extremity pain (Location of Secondary source of pain) (Bilateral) (L>R) 02/27/2016  . Osteoarthritis of hip (Bilateral) (L>R) 02/27/2016  . Chronic neck pain (posterior midline) (Bilateral) (L>R) 02/27/2016  . Cervicogenic headache (Bilateral) (L>R) 02/27/2016  . Occipital headache (Bilateral) (L>R) 02/27/2016  . Chronic upper extremity pain (Bilateral) (L>R) 02/27/2016  . Chronic Cervical radicular pain (Bilateral) (L>R) 02/27/2016  . Chronic lumbar radicular pain (Right) (L5 dermatome) 02/27/2016  . Lumbar facet syndrome (Bilateral) (L>R) 02/27/2016  . Long term prescription benzodiazepine use 02/27/2016  . Hypertension   . COPD (chronic obstructive pulmonary disease) ([place]) 10/09/2015  . GERD (gastroesophageal reflux disease) 10/09/2015  . Non-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/2014 08/02/2015  . Hyperlipidemia 08/02/2015  . Costochondritis 08/02/2015  . 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 GAD (generalized anxiety disorder) 09/03/2014  . Narcotic dependence ([place]) 09/03/2014  . PVD (peripheral vascular disease) ([place]) 09/03/2014  . Smoker 09/03/2014  . Cigarette nicotine dependence with nicotine-induced disorder 07/08/2014  . Anemia of chronic disease 03/19/2014  . Narcotic abuse, continuous ([place]) 03/19/2014  . Polysubstance abuse ([place]) 03/19/2014  . MSSA (methicillin susceptible Staphylococcus aureus) septicemia ([place]) 03/07/2014  . Hypomagnesemia 01/04/2014  . Chronic cough 09/04/2011  . Sleep apnea, obstructive 09/04/2011   PCP:  [name], [name] Pharmacy:   [id number] [id number]: [phone]: [phone]    Social Determinants of Health (SDOH) Interventions    Readmission Risk Interventions Readmission Risk Prevention Plan 10/20/2018  Transportation Screening Complete  PCP or Specialist Appt within 3-5 Days Complete  Palliative Care Screening Not Applicable  Some recent data might be hidden

## 2018-10-20 NOTE — Telephone Encounter (Signed)
Can we cancel her appointment tomorrow on 10/21/2018 she is admitted. We will touch base with her as she will need a hospital follow up.

## 2018-10-20 NOTE — Progress Notes (Signed)
PHARMACY CONSULT NOTE - FOLLOW UP  Pharmacy Consult for Electrolyte Monitoring and Replacement   Recent Labs: Potassium (mmol/L)  Date Value  10/20/2018 3.5   Magnesium (mg/dL)  Date Value  10/20/2018 2.0   Calcium (mg/dL)  Date Value  10/20/2018 7.7 (L)   Albumin (g/dL)  Date Value  10/19/2018 3.7  02/20/2018 4.3   Phosphorus (mg/dL)  Date Value  10/19/2018 3.3   Sodium (mmol/L)  Date Value  10/20/2018 139  02/20/2018 141     Assessment: Patient admitted for pyelonephritis.   Goal of Therapy:  Potassium ~4.0, Magnesium ~2.0   Plan:  No supplementation at this time. Will follow-up with AM labs.   Pharmacy will continue to monitor.  Chinita Greenland PharmD Clinical Pharmacist 10/20/2018

## 2018-10-20 NOTE — Progress Notes (Signed)
MD notified: Hear rate ranges between 120-108bpm. Would you like to order any medication to help control it. Placed on 2L of oxygen as her oxygen level drops to 89% on room air. 92% on 2L right now. The patient just had some premature atrial contractions reported by tele.

## 2018-10-21 ENCOUNTER — Ambulatory Visit: Payer: Self-pay | Admitting: Physician Assistant

## 2018-10-21 ENCOUNTER — Inpatient Hospital Stay: Payer: Medicaid Other

## 2018-10-21 LAB — BASIC METABOLIC PANEL
Anion gap: 7 (ref 5–15)
BUN: 11 mg/dL (ref 6–20)
CO2: 22 mmol/L (ref 22–32)
Calcium: 8.3 mg/dL — ABNORMAL LOW (ref 8.9–10.3)
Chloride: 111 mmol/L (ref 98–111)
Creatinine, Ser: 0.76 mg/dL (ref 0.44–1.00)
GFR calc Af Amer: >60 mL/min (ref >60)
GFR calc non Af Amer: >60 mL/min (ref >60)
Glucose, Bld: 103 mg/dL — ABNORMAL HIGH (ref 70–99)
Potassium: 3.9 mmol/L (ref 3.5–5.1)
Sodium: 140 mmol/L (ref 135–145)

## 2018-10-21 LAB — URINE CULTURE: Culture: >=100000 — AB

## 2018-10-21 LAB — MAGNESIUM: Magnesium: 2 mg/dL (ref 1.7–2.4)

## 2018-10-21 LAB — PROCALCITONIN: Procalcitonin: 4.76 ng/mL

## 2018-10-21 MED ORDER — CEFAZOLIN SODIUM-DEXTROSE 2-4 GM/100ML-% IV SOLN
2.0000 g | Freq: Three times a day (TID) | INTRAVENOUS | Status: DC
  Administered 2018-10-21 – 2018-10-22 (×3): 2 g via INTRAVENOUS
  Filled 2018-10-21 (×5): qty 100

## 2018-10-21 MED ORDER — FUROSEMIDE 10 MG/ML IJ SOLN
40.0000 mg | Freq: Once | INTRAMUSCULAR | Status: AC
  Administered 2018-10-21: 40 mg via INTRAVENOUS
  Filled 2018-10-21: qty 4

## 2018-10-21 NOTE — Progress Notes (Signed)
PHARMACY CONSULT NOTE - FOLLOW UP  Pharmacy Consult for Electrolyte Monitoring and Replacement   Recent Labs: Potassium (mmol/L)  Date Value  10/21/2018 3.9   Magnesium (mg/dL)  Date Value  10/21/2018 2.0   Calcium (mg/dL)  Date Value  10/21/2018 8.3 (L)   Albumin (g/dL)  Date Value  10/19/2018 3.7  02/20/2018 4.3   Phosphorus (mg/dL)  Date Value  10/19/2018 3.3   Sodium (mmol/L)  Date Value  10/21/2018 140  02/20/2018 141     Assessment: Patient admitted for pyelonephritis.   Goal of Therapy:  Potassium ~4.0, Magnesium ~2.0   Plan:  K 3.9, Mag 2.0. No supplementation at this time. Will follow-up with AM labs.   Pharmacy will continue to monitor.  Chinita Greenland PharmD Clinical Pharmacist 10/21/2018

## 2018-10-21 NOTE — Progress Notes (Signed)
[facility] at [place] NAME: Sara Munoz    MR#:  [id number]  DATE OF BIRTH:  [birth year]  SUBJECTIVE:  CHIEF COMPLAINT:   Chief Complaint  Patient presents with  . Nausea  . Abdominal Pain  . Emesis  . Chest Pain   Continues to complain of chronic back pain.  Aebrile  On 2-1/2 L oxygen. Has cough   REVIEW OF SYSTEMS:    Review of Systems  Constitutional: Positive for malaise/fatigue. Negative for chills, fever and weight loss.  HENT: Negative for hearing loss and nosebleeds.   Eyes: Negative for blurred vision, double vision and pain.  Respiratory: Positive for cough and shortness of breath. Negative for hemoptysis, sputum production and wheezing.   Cardiovascular: Negative for chest pain, palpitations, orthopnea and leg swelling.  Gastrointestinal: Negative for abdominal pain, constipation, diarrhea, nausea and vomiting.  Genitourinary: Negative for dysuria and hematuria.  Musculoskeletal: Negative for back pain, falls and myalgias.  Skin: Negative for rash.  Neurological: Negative for dizziness, tremors, sensory change, speech change, focal weakness, seizures and headaches.  Endo/Heme/Allergies: Does not bruise/bleed easily.  Psychiatric/Behavioral: Negative for depression and memory loss. The patient is not nervous/anxious.     DRUG ALLERGIES:   Allergies  Allergen Reactions  . Flexeril [Cyclobenzaprine] Hives and Swelling    Facial/lip swelling     . Levofloxacin Hives and Swelling  . Keflex [Cephalexin] Hives  . Ketorolac   . Phenergan [Promethazine Hcl] Other (See Comments)    Agitation.   . Toradol [Ketorolac Tromethamine] Swelling and Other (See Comments)    Reaction:  Facial/tongue swelling  Reaction:  Facial/tongue swelling   . Tramadol Hives, Swelling and Other (See Comments)    Reaction:  Lip swelling  Reaction:  Lip swelling   . Zoloft [Sertraline Hcl] Swelling    Tongue swelling       VITALS:  Blood  pressure 135/78, pulse (!) 110, temperature 98.6 F (37 C), temperature source Oral, resp. rate 20, height 5\' 4"  (1.626 m), weight 87.8 kg, SpO2 96 %.  PHYSICAL EXAMINATION:   Physical Exam  GENERAL:  53 y.o.-year-old patient lying in the bed with no acute distress.  EYES: Pupils equal, round, reactive to light and accommodation. No scleral icterus. Extraocular muscles intact.  HEENT: Head atraumatic, normocephalic. Oropharynx and nasopharynx clear.  NECK:  Supple, no jugular venous distention. No thyroid enlargement, no tenderness.  LUNGS: Bilateral coarse breath sounds CARDIOVASCULAR: S1, S2 normal. No murmurs, rubs, or gallops.  ABDOMEN: Soft, nontender, nondistended. Bowel sounds present. No organomegaly or mass.  EXTREMITIES: No cyanosis, clubbing or edema b/l.    NEUROLOGIC: Cranial nerves II through XII are intact. No focal Motor or sensory deficits b/l.   PSYCHIATRIC: The patient is alert and oriented x 3.  SKIN: No obvious rash, lesion, or ulcer.   LABORATORY PANEL:   CBC Recent Labs  Lab 10/20/18 0338  WBC 8.5  HGB 10.7*  HCT 33.2*  PLT 77*   ------------------------------------------------------------------------------------------------------------------ Chemistries  Recent Labs  Lab 10/19/18 0128  10/21/18 0309  NA 137   < > 140  K 2.8*   < > 3.9  CL 99   < > 111  CO2 22   < > 22  GLUCOSE 108*   < > 103*  BUN 20   < > 11  CREATININE 1.52*   < > 0.76  CALCIUM 8.7*   < > 8.3*  MG 1.2*   < > 2.0  AST 37  --   --  ALT 21  --   --   ALKPHOS 113  --   --   BILITOT 1.1  --   --    < > = values in this interval not displayed.   ------------------------------------------------------------------------------------------------------------------  Cardiac Enzymes Recent Labs  Lab 10/19/18 1747  TROPONINI 0.12*   ------------------------------------------------------------------------------------------------------------------  RADIOLOGY:  No results  found.   ASSESSMENT AND PLAN:   * Pyelonephritis with E. Coli  bacteremia.  E. coli in urine cultures.  Changed antibiotic to cefazolin. Afebrile today.  *Acute hypoxic respiratory failure secondary to pulmonary edema from fluid resuscitation.  Will give 1 dose of Lasix.  Check chest x-ray. Wean oxygen as tolerated. She does seem to have poor pulmonary baseline with her COPD and smoking.  * Septic shock - resolved Was on Levophed and now blood pressure improved  *Mild elevation in troponin secondary demand ischemia.  No chest pain.  No EKG changes.  No significant elevation on repeat.  *Acute kidney injury secondary to septic shock has resolved.  *DVT prophylaxis with Lovenox  All the records are reviewed and case discussed with Care Management/Social Worker Management plans discussed with the patient, family and they are in agreement.  CODE STATUS: FULL CODE  TOTAL TIME TAKING CARE OF THIS PATIENT: 35 minutes.   [name] M.D on 10/21/2018 at 11:12 AM  Between 7am to 6pm - Pager - [phone]  After 6pm go to [email or web address] - password [email or web address] [facility]  Office  [phone]  CC: Primary care physician; [name], [name]  Note: This dictation was prepared with Dragon dictation along with smaller phrase technology. Any transcriptional errors that result from this process are unintentional.

## 2018-10-21 NOTE — Progress Notes (Signed)
[facility] at [place] NAME: Sara Munoz    MR#:  [id number]  DATE OF BIRTH:  12/27/1965  SUBJECTIVE:admitted for septic shock , was on levophed.now feeling better.afebrile,off the pressors.will move pt out of ICU today.  CHIEF COMPLAINT:   Chief Complaint  Patient presents with  . Nausea  . Abdominal Pain  . Emesis  . Chest Pain    REVIEW OF SYSTEMS:   ROS CONSTITUTIONAL: No fever,has chronic back pain.  EYES: No blurred or double vision.  EARS, NOSE, AND THROAT: No tinnitus or ear pain.  RESPIRATORY: has some cough but no SOB,chronic smoker,requesting nicotine patch. CARDIOVASCULAR: No chest pain, orthopnea, edema.  GASTROINTESTINAL:no abdominal pain.   GENITOURINARY: No dysuria, hematuria.  ENDOCRINE: No polyuria, nocturia,  HEMATOLOGY: No anemia, easy bruising or bleeding SKIN: No rash or lesion. MUSCULOSKELETAL: No joint pain or arthritis.   NEUROLOGIC: No tingling, numbness, weakness.  PSYCHIATRY: No anxiety or depression.   DRUG ALLERGIES:   Allergies  Allergen Reactions  . Flexeril [Cyclobenzaprine] Hives and Swelling    Facial/lip swelling     . Levofloxacin Hives and Swelling  . Keflex [Cephalexin] Hives  . Ketorolac   . Phenergan [Promethazine Hcl] Other (See Comments)    Agitation.   . Toradol [Ketorolac Tromethamine] Swelling and Other (See Comments)    Reaction:  Facial/tongue swelling  Reaction:  Facial/tongue swelling   . Tramadol Hives, Swelling and Other (See Comments)    Reaction:  Lip swelling  Reaction:  Lip swelling   . Zoloft [Sertraline Hcl] Swelling    Tongue swelling       VITALS:  Blood pressure (!) 155/90, pulse 99, temperature 98.6 F (37 C), temperature source Oral, resp. rate 20, height 5\' 4"  (1.626 m), weight 87.8 kg, SpO2 92 %.  PHYSICAL EXAMINATION:  GENERAL:  53 y.o.-year-old patient lying in the bed with no acute distress.  EYES: Pupils equal, round, reactive to light  and accommodation. No scleral icterus. Extraocular muscles intact.  HEENT: Head atraumatic, normocephalic. Oropharynx and nasopharynx clear.  NECK:  Supple, no jugular venous distention. No thyroid enlargement, no tenderness.  LUNGS: faint wheezing.bilaterally  CARDIOVASCULAR: S1, S2 normal. No murmurs, rubs, or gallops.  ABDOMEN: Soft, nontender, nondistended. Bowel sounds present. No organomegaly or mass.  EXTREMITIES: No pedal edema, cyanosis, or clubbing.  NEUROLOGIC: Cranial nerves II through XII are intact. Muscle strength 5/5 in all extremities. Sensation intact. Gait not checked.  PSYCHIATRIC: The patient is alert and oriented x 3.  SKIN: No obvious rash, lesion, or ulcer.    LABORATORY PANEL:   CBC Recent Labs  Lab 10/20/18 0338  WBC 8.5  HGB 10.7*  HCT 33.2*  PLT 77*   ------------------------------------------------------------------------------------------------------------------  Chemistries  Recent Labs  Lab 10/19/18 0128  10/21/18 0309  NA 137   < > 140  K 2.8*   < > 3.9  CL 99   < > 111  CO2 22   < > 22  GLUCOSE 108*   < > 103*  BUN 20   < > 11  CREATININE 1.52*   < > 0.76  CALCIUM 8.7*   < > 8.3*  MG 1.2*   < > 2.0  AST 37  --   --   ALT 21  --   --   ALKPHOS 113  --   --   BILITOT 1.1  --   --    < > = values in this interval not displayed.   ------------------------------------------------------------------------------------------------------------------  Cardiac Enzymes Recent Labs  Lab 10/19/18 1747  TROPONINI 0.12*   ------------------------------------------------------------------------------------------------------------------  RADIOLOGY:  Dg Chest 2 View  Result Date: 10/21/2018 CLINICAL DATA:  Hypoxia. EXAM: CHEST - 2 VIEW COMPARISON:  10/19/2018 FINDINGS: Lungs are hypoinflated with mild patchy bilateral opacification worse in the lung bases. Some coarse interstitial changes. Small amount of posterior left pleural fluid.  Cardiomediastinal silhouette and remainder of the exam is unchanged. IMPRESSION: Patchy bilateral opacification worse in the lung bases with some coarse interstitial changes. Small amount left pleural fluid. Findings may be due to infection versus inflammatory process and less likely vascular congestion. Electronically Signed   By: Marin Olp M.D.   On: 10/21/2018 11:37    EKG:   Orders placed or performed during the hospital encounter of 10/19/18  . ED EKG 12-Lead  . ED EKG 12-Lead  . EKG 12-Lead  . EKG 12-Lead  . EKG 12-Lead  . EKG 12-Lead    ASSESSMENT AND PLAN:  1.Septic shock due to UTI;resolving,intially required levophed.,now off,afebrle,continmue broad spectrum abx,gram negative ,Covid 19 test has been negative on admission Bacteremia,wait for full sensitivity result,nmarrow down abx based in S results. 2.mildly elevated troponin;due to septic shock,check echo 3.acute kidney injury due to septic shock;improved 4.acute bronchitis/smoker;continue abx,albuterol, Advised to quit smoking  All the records are reviewed and case discussed with Care Management/Social Workerr. Management plans discussed with the patient, family and they are in agreement.  CODE STATUS:full code  TOTAL TIME TAKING CARE OF THIS PATIENT: 64minutes.  More than 50% time spent in counseling and coordination of care POSSIBLE D/C IN 1-2 DAYS, DEPENDING ON CLINICAL CONDITION.   Epifanio Lesches M.D on 10/21/2018 at 3:19 PM  Between 7am to 6pm - Pager - [phone]  After 6pm go to [email or web address] - password [email or web address] [facility]  [facility]  Office  [phone]  CC: Primary care physician; [name], [name]   Note: This dictation was prepared with Dragon dictation along with smaller phrase technology. Any transcriptional errors that result from this process are unintentional.

## 2018-10-22 LAB — CULTURE, BLOOD (ROUTINE X 2)

## 2018-10-22 LAB — BASIC METABOLIC PANEL
Anion gap: 9 (ref 5–15)
BUN: 10 mg/dL (ref 6–20)
CO2: 28 mmol/L (ref 22–32)
Calcium: 9.1 mg/dL (ref 8.9–10.3)
Chloride: 102 mmol/L (ref 98–111)
Creatinine, Ser: 0.77 mg/dL (ref 0.44–1.00)
GFR calc Af Amer: >60 mL/min (ref >60)
GFR calc non Af Amer: >60 mL/min (ref >60)
Glucose, Bld: 89 mg/dL (ref 70–99)
Potassium: 3.3 mmol/L — ABNORMAL LOW (ref 3.5–5.1)
Sodium: 139 mmol/L (ref 135–145)

## 2018-10-22 MED ORDER — FUROSEMIDE 10 MG/ML IJ SOLN
20.0000 mg | Freq: Once | INTRAMUSCULAR | Status: AC
  Administered 2018-10-22: 20 mg via INTRAVENOUS
  Filled 2018-10-22: qty 4

## 2018-10-22 MED ORDER — POTASSIUM CHLORIDE CRYS ER 20 MEQ PO TBCR
40.0000 meq | EXTENDED_RELEASE_TABLET | Freq: Once | ORAL | Status: AC
  Administered 2018-10-22: 40 meq via ORAL
  Filled 2018-10-22: qty 2

## 2018-10-22 MED ORDER — CEPHALEXIN 500 MG PO CAPS: 500.0000 mg | ORAL_CAPSULE | Freq: Three times a day (TID) | ORAL | 0 refills | Status: DC

## 2018-10-22 MED ORDER — CEPHALEXIN 500 MG PO CAPS
500.0000 mg | ORAL_CAPSULE | Freq: Three times a day (TID) | ORAL | Status: DC
  Administered 2018-10-22: 500 mg via ORAL
  Filled 2018-10-22: qty 1

## 2018-10-22 MED ORDER — TIZANIDINE HCL 4 MG PO TABS
4.0000 mg | ORAL_TABLET | Freq: Every day | ORAL | Status: DC
  Filled 2018-10-22: qty 1

## 2018-10-22 MED ORDER — OXYCODONE HCL 5 MG PO TABS
10.0000 mg | ORAL_TABLET | Freq: Four times a day (QID) | ORAL | Status: DC
  Administered 2018-10-22: 10 mg via ORAL
  Filled 2018-10-22: qty 2

## 2018-10-22 MED ORDER — OXYCODONE HCL 5 MG PO TABS: 10.0000 mg | ORAL_TABLET | Freq: Four times a day (QID) | ORAL | Status: DC | PRN

## 2018-10-22 MED ORDER — METOPROLOL TARTRATE 25 MG PO TABS
25.0000 mg | ORAL_TABLET | Freq: Two times a day (BID) | ORAL | Status: DC
  Administered 2018-10-22: 25 mg via ORAL
  Filled 2018-10-22: qty 1

## 2018-10-22 MED ORDER — LISINOPRIL 20 MG PO TABS
40.0000 mg | ORAL_TABLET | Freq: Every day | ORAL | Status: DC
  Administered 2018-10-22: 40 mg via ORAL
  Filled 2018-10-22: qty 2

## 2018-10-22 NOTE — Progress Notes (Signed)
No reaction to Keflex po. Pt. Understands to call her MD if reaction occurs at home or come to ED. Went over discharge instructions. Understands to pick up the Keflex and knows about PFT and MD follow up appts. Tele and IV's dc'd  Awaiting ride home. Shower complete

## 2018-10-22 NOTE — Progress Notes (Signed)
Ox. To 88 while walking, ox. 97 % when sitting

## 2018-10-22 NOTE — Progress Notes (Signed)
Discharged via wheelchair with all of her valuables. Family picked up

## 2018-10-22 NOTE — Progress Notes (Signed)
Patient's O2 sat decreased to 87% on room. Arrived to room to find pt. asleep. Pt. awakened and assessed. Patient's O2 sat increased to 92% on 1L O2 per Nasal cannula. Will continue to monitor.

## 2018-10-22 NOTE — Progress Notes (Signed)
Has used Incentive spirometer several times this morning.Marland Kitchen  Room air ox....97/98% Will recheck

## 2018-10-22 NOTE — Discharge Instructions (Addendum)
Acute Respiratory Distress Syndrome, Adult  Acute respiratory distress syndrome is a life-threatening condition in which fluid collects in the lungs. This prevents the lungs from filling with air and passing oxygen into the blood. This can cause the lungs and other vital organs to fail. The condition usually develops following an infection, illness, surgery, or injury. What are the causes? This condition may be caused by:  An infection, such as sepsis or pneumonia.  A serious injury to the head or chest.  Severe bleeding from an injury.  A major surgery.  Breathing in harmful chemicals or smoke.  Blood transfusions.  A blood clot in the lungs.  Breathing in vomit (aspiration).  Near-drowning.  Inflammation of the pancreas (pancreatitis).  A drug overdose. What are the signs or symptoms? Sudden shortness of breath and rapid breathing are the main symptoms of this condition. Other symptoms may include:  A fast or irregular heartbeat.  Skin, lips, or fingernails that look blue (cyanosis).  Confusion.  Tiredness or loss of energy.  Chest pain, particularly while taking a breath.  Coughing.  Restlessness or anxiety.  Fever. This is usually present if there is an underlying infection, such as pneumonia. How is this diagnosed? This condition is diagnosed based on:  Your symptoms.  Medical history.  A physical exam. During the exam, your health care provider will listen to your heart and check for crackling or wheezing sounds in your lungs. You may also have other tests to confirm the diagnosis and measure how well your lungs are working. These may include:  Measuring the amount of oxygen in your blood. Your health care provider will use two methods to do this procedure: ? A small device (pulse oximeter) that is placed on your finger, earlobe, or toe. ? An arterial blood gas test. A sample of blood is taken from an artery and tested for oxygen levels.  Blood  tests.  Chest X-rays or CT scans to look for fluid in the lungs.  Taking a sample of your sputum to test for infection.  Heart test, such as an echocardiogram or electrocardiogram. This is done to rule out any heart problems (such as heart failure) that may be causing your symptoms.  Bronchoscopy. During this test, a thin, flexible tube with a light is passed into the mouth or nose, down the windpipe, and into the lungs. How is this treated? Treatment depends on the cause of your condition. The goal is to support you while your lungs heal and the underlying cause is treated. Treatment may include:  Oxygen therapy. This may be done through: ? A tube in your nose or a face mask. ? A ventilator. This device helps move air into and out of your lungs through a breathing tube that is inserted into your mouth or nose.  Continuous positive airway pressure (CPAP). This treatment uses mild air pressure to keep the airways open. A mask or other device will be placed over your nose or mouth.  Tracheostomy. During this procedure, a small cut is made in your neck to create an opening to your windpipe. A breathing tube is placed directly into your windpipe. The breathing tube is connected to a ventilator. This is done if you have problems with your airway or if you need a ventilator for a long period of time.  Positioning you to lie on your stomach (prone position).  Medicines, such as: ? Sedatives to help you relax. ? Blood pressure medicines. ? Antibiotics to treat infection. ?  Blood thinners to prevent blood clots. ? Diuretics to help prevent excess fluid.  Fluids and nutrients given through an IV tube.  Wearing compression stockings on your legs to prevent blood clots.  Extra corporeal membrane oxygenation (ECMO). This treatment takes blood outside your body, adds oxygen, and removes carbon dioxide. The blood is then returned to your body. This treatment is only used in severe cases. Follow  these instructions at home:  Take over-the-counter and prescription medicines only as told by your health care provider.  Do not use any products that contain nicotine or tobacco, such as cigarettes and e-cigarettes. If you need help quitting, ask your health care provider.  Limit alcohol intake to no more than 1 drink per day for nonpregnant women and 2 drinks per day for men. One drink equals 12 oz of beer, 5 oz of wine, or 1 oz of hard liquor.  Ask friends and family to help you if daily activities make you tired.  Attend any pulmonary rehabilitation as told by your health care provider. This may include: ? Education about your condition. ? Exercises. ? Breathing training. ? Counseling. ? Learning techniques to conserve energy. ? Nutrition counseling.  Keep all follow-up visits as told by your health care provider. This is important. Contact a health care provider if:  You become short of breath during activity or while resting.  You develop a cough that does not go away.  You have a fever.  Your symptoms do not get better or they get worse.  You become anxious or depressed. Get help right away if:  You have sudden shortness of breath.  You develop sudden chest pain that does not go away.  You develop a rapid heart rate.  You develop swelling or pain in one of your legs.  You cough up blood.  You have trouble breathing.  Your skin, lips, or fingernails turn blue. These symptoms may represent a serious problem that is an emergency. Do not wait to see if the symptoms will go away. Get medical help right away. Call your local emergency services (911 in the [place] Do not drive yourself to the hospital. Summary  Acute respiratory distress syndrome is a life-threatening condition in which fluid collects in the lungs, which leads the lungs and other vital organs to fail.  This condition usually develops following an infection, illness, surgery, or injury.  Sudden  shortness of breath and rapid breathing are the main symptoms of acute respiratory distress syndrome.  Treatment may include oxygen therapy, continuous positive airway pressure (CPAP), tracheostomy, lying on your stomach (prone position), medicines, fluids and nutrients given through an IV tube, compression stockings, and extra corporeal membrane oxygenation (ECMO). This information is not intended to replace advice given to you by your health care provider. Make sure you discuss any questions you have with your health care provider. Document Released: 06/10/2005 Document Revised: 05/27/2016 Document Reviewed: 05/27/2016 Elsevier Interactive Patient Education  2019 [employer].   Antibiotic Medicine, Adult  Antibiotic medicines treat infections caused by a type of germ called bacteria. They work by killing the bacteria that make you sick. When do I need to take antibiotics? You often need these medicines to treat bacterial infections, such as:  A urinary tract infection (UTI).  Strep throat.  Meningitis. This affects the spinal cord and brain.  A bad lung infection. You may start the medicines while your doctor waits for tests to come back. When the tests come back, your doctor may change or  stop your medicine. When are antibiotics not needed? You do not need these medicines for most common illnesses, such as:  A cold.  The flu.  A sore throat. Antibiotics are not always needed for all infections caused by bacteria. Do not ask for these medicines, or take them, when they are not needed. What are the risks of taking antibiotics? Most antibiotics can cause an infection called Clostridioides difficile (C. diff).This causes watery poop (diarrhea). Let your doctor know right away if:  You have watery poop while taking an antibiotic.  You have watery poop after you stop taking an antibiotic. The illness can happen weeks after you stop the medicine. You also have a risk of getting an  infection in the future that antibiotics cannot treat (antibiotic-resistant infection). This type of infection can be dangerous. What else should I know about taking antibiotics?   You need to take the entire prescription. ? Take the medicine for as long as told by your doctor. ? Do not stop taking it even if you start to feel better.  Try not to miss any doses. If you miss a dose, call your doctor.  Birth control pills may not work. If you take birth control pills: ? Keep on taking them. ? Use a second form of birth control, such as a condom. Do this for as long as told by your doctor.  Ask your doctor: ? How long to wait in between doses. ? If you should take the medicine with food. ? If there is anything you should stay away from while taking the antibiotic, such as: ? Food. ? Drinks. ? Medicines. ? If there are any side effects you should watch for.  Only take the medicines that your doctor told you to take. Do not take medicines that were given to someone else.  Drink a large glass of water with the medicine.  Ask the pharmacist for a tool to measure the medicine, such as: ? A syringe. ? A cup. ? A spoon.  Throw away any extra medicine. Contact a doctor if:  You get worse.  You have new joint pain or muscle aches after starting the medicine.  You have side effects from the medicine, such as: ? Stomach pain. ? Watery poop. ? Feeling sick to your stomach (nausea). Get help right away if:  You have signs of a very bad allergic reaction. If this happens, stop taking the medicine right away. Signs may include: ? Hives. These are raised, itchy, red bumps on the skin. ? Skin rash. ? Trouble breathing. ? Wheezing. ? Swelling. ? Feeling dizzy. ? Throwing up (vomiting).  Your pee (urine) is dark, or is the color of blood.  Your skin turns yellow.  You bruise easily.  You bleed easily.  You have very bad watery poop and cramps in your belly.  You have a very  bad headache. Summary  Antibiotics are often used to treat infections caused by bacteria.  Only take these medicines when needed.  Let your doctor know if you have watery poop while taking an antibiotic.  You need to take the entire prescription. This information is not intended to replace advice given to you by your health care provider. Make sure you discuss any questions you have with your health care provider. Document Released: 03/19/2008 Document Revised: 12/09/2017 Document Reviewed: 06/12/2016 Elsevier Interactive Patient Education  2019 [employer].   Hypoxemia Hypoxemia occurs when the blood does not contain enough oxygen. The body cannot work well  when it does not have enough oxygen because every part of the body needs oxygen. Oxygen enters the lungs when we breathe in, then it travels to all parts of the body through the blood. Hypoxemia can develop suddenly or slowly. What are the causes? Common causes of this condition include:  Long-term (chronic) lung diseases, such as chronic obstructive pulmonary disease (COPD) or interstitial lung disease.  Disorders that affect breathing at night, such as sleep apnea.  Fluid buildup in the lungs (pulmonary edema).  Lung infection (pneumonia).  Lung or throat cancer.  Abnormal blood flow that bypasses the lungs (having a shunt).  Certain diseasesthat affect nerves or muscles.  A collapsed lung (pneumothorax).  A blood clot in the lungs (pulmonary embolus).  Certain types of heart disease.  Slow or shallow breathing (hypoventilation).  Certain medicines.  High altitudes.  Toxic chemicals, smoke, and gases. What are the signs or symptoms? In some cases, there may be no symptoms of this condition. If you do have symptoms, they may include:  Shortness of breath (dyspnea).  Bluish color of the skin, lips, or nail beds.  Breathing that is fast, noisy, or shallow.  A fast heartbeat.  Feeling tired or  sleepy.  Feeling confused or worried. If hypoxemia develops quickly, you will likely have dyspnea. If hypoxemia develops slowly over months or years, you may not notice any symptoms. How is this diagnosed? This condition is diagnosed by:  A physical exam.  Blood tests.  A test that measures the percentage of oxygen in your blood (pulse oximetry). This is done with a sensor that is placed on your finger, toe, or earlobe. How is this treated?  Treatment for this condition depends on the underlying cause of your hypoxemia. You will likely be treated with oxygen therapy to restore your blood oxygen level. Depending on the cause of your hypoxemia, you may need oxygen therapy for a short time (weeks or months), or you may need it for the rest of your life. Your health care provider may also recommend other therapies to treat the underlying cause of your hypoxemia. Follow these instructions at home:   Take over-the-counter and prescription medicines only as told by your health care provider.  If you are on oxygen therapy, follow oxygen safety precautions as directed by your health care provider. These may include: ? Always having a backup supply of oxygen. ? Not allowing anyone to smoke or have a fire around oxygen. ? Handling oxygen tanks carefully and as instructed.  Do not use any products that contain nicotine or tobacco, such as cigarettes and e-cigarettes. If you need help quitting, ask your health care provider. Stay away from people who smoke.  Keep all follow-up visits as told by your health care provider. This is important. Contact a health care provider if:  You have any concerns about your oxygen therapy.  You have trouble breathing, even during or after treatment.  You become short of breath when you exercise.  You are tired when you wake up.  You have a headache when you wake up. Get help right away if:  Your shortness of breath gets worse, especially with normal or  minimal activity.  You have a bluish color of the skin, lips, or nail beds.  You become confused or you cannot think properly.  You cough up dark mucus or blood.  You have chest pain.  You have a fever. Summary  Hypoxemia occurs when the blood does not contain enough oxygen.  Hypoxemia  may or may not cause symptoms. Often, the main symptom is shortness of breath (dyspnea).  Depending on the cause of your hypoxemia, you may need oxygen therapy for a short time (weeks or months), or you may need it for the rest of your life.  If you are on oxygen therapy, follow oxygen safety precautions as directed by your health care provider. This information is not intended to replace advice given to you by your health care provider. Make sure you discuss any questions you have with your health care provider. Document Released: 12/24/2010 Document Revised: 05/14/2016 Document Reviewed: 05/14/2016 Elsevier Interactive Patient Education  2019 [employer]. Hypertension Hypertension, commonly called high blood pressure, is when the force of blood pumping through the arteries is too strong. The arteries are the blood vessels that carry blood from the heart throughout the body. Hypertension forces the heart to work harder to pump blood and may cause arteries to become narrow or stiff. Having untreated or uncontrolled hypertension can cause heart attacks, strokes, kidney disease, and other problems. A blood pressure reading consists of a higher number over a lower number. Ideally, your blood pressure should be below 120/80. The first ("top") number is called the systolic pressure. It is a measure of the pressure in your arteries as your heart beats. The second ("bottom") number is called the diastolic pressure. It is a measure of the pressure in your arteries as the heart relaxes. What are the causes? The cause of this condition is not known. What increases the risk? Some risk factors for high blood  pressure are under your control. Others are not. Factors you can change  Smoking.  Having type 2 diabetes mellitus, high cholesterol, or both.  Not getting enough exercise or physical activity.  Being overweight.  Having too much fat, sugar, calories, or salt (sodium) in your diet.  Drinking too much alcohol. Factors that are difficult or impossible to change  Having chronic kidney disease.  Having a family history of high blood pressure.  Age. Risk increases with age.  Race. You may be at higher risk if you are African-American.  Gender. Men are at higher risk than women before age 58. After age 58, women are at higher risk than men.  Having obstructive sleep apnea.  Stress. What are the signs or symptoms? Extremely high blood pressure (hypertensive crisis) may cause:  Headache.  Anxiety.  Shortness of breath.  Nosebleed.  Nausea and vomiting.  Severe chest pain.  Jerky movements you cannot control (seizures). How is this diagnosed? This condition is diagnosed by measuring your blood pressure while you are seated, with your arm resting on a surface. The cuff of the blood pressure monitor will be placed directly against the skin of your upper arm at the level of your heart. It should be measured at least twice using the same arm. Certain conditions can cause a difference in blood pressure between your right and left arms. Certain factors can cause blood pressure readings to be lower or higher than normal (elevated) for a short period of time:  When your blood pressure is higher when you are in a health care provider's office than when you are at home, this is called white coat hypertension. Most people with this condition do not need medicines.  When your blood pressure is higher at home than when you are in a health care provider's office, this is called masked hypertension. Most people with this condition may need medicines to control blood pressure. If you  have  a high blood pressure reading during one visit or you have normal blood pressure with other risk factors:  You may be asked to return on a different day to have your blood pressure checked again.  You may be asked to monitor your blood pressure at home for 1 week or longer. If you are diagnosed with hypertension, you may have other blood or imaging tests to help your health care provider understand your overall risk for other conditions. How is this treated? This condition is treated by making healthy lifestyle changes, such as eating healthy foods, exercising more, and reducing your alcohol intake. Your health care provider may prescribe medicine if lifestyle changes are not enough to get your blood pressure under control, and if:  Your systolic blood pressure is above 130.  Your diastolic blood pressure is above 80. Your personal target blood pressure may vary depending on your medical conditions, your age, and other factors. Follow these instructions at home: Eating and drinking   Eat a diet that is high in fiber and potassium, and low in sodium, added sugar, and fat. An example eating plan is called the DASH (Dietary Approaches to Stop Hypertension) diet. To eat this way: ? Eat plenty of fresh fruits and vegetables. Try to fill half of your plate at each meal with fruits and vegetables. ? Eat whole grains, such as whole wheat pasta, brown rice, or whole grain bread. Fill about one quarter of your plate with whole grains. ? Eat or drink low-fat dairy products, such as skim milk or low-fat yogurt. ? Avoid fatty cuts of meat, processed or cured meats, and poultry with skin. Fill about one quarter of your plate with lean proteins, such as fish, chicken without skin, beans, eggs, and tofu. ? Avoid premade and processed foods. These tend to be higher in sodium, added sugar, and fat.  Reduce your daily sodium intake. Most people with hypertension should eat less than 1,500 mg of sodium a  day.  Limit alcohol intake to no more than 1 drink a day for nonpregnant women and 2 drinks a day for men. One drink equals 12 oz of beer, 5 oz of wine, or 1 oz of hard liquor. Lifestyle   Work with your health care provider to maintain a healthy body weight or to lose weight. Ask what an ideal weight is for you.  Get at least 30 minutes of exercise that causes your heart to beat faster (aerobic exercise) most days of the week. Activities may include walking, swimming, or biking.  Include exercise to strengthen your muscles (resistance exercise), such as pilates or lifting weights, as part of your weekly exercise routine. Try to do these types of exercises for 30 minutes at least 3 days a week.  Do not use any products that contain nicotine or tobacco, such as cigarettes and e-cigarettes. If you need help quitting, ask your health care provider.  Monitor your blood pressure at home as told by your health care provider.  Keep all follow-up visits as told by your health care provider. This is important. Medicines  Take over-the-counter and prescription medicines only as told by your health care provider. Follow directions carefully. Blood pressure medicines must be taken as prescribed.  Do not skip doses of blood pressure medicine. Doing this puts you at risk for problems and can make the medicine less effective.  Ask your health care provider about side effects or reactions to medicines that you should watch for. Contact a health  care provider if:  You think you are having a reaction to a medicine you are taking.  You have headaches that keep coming back (recurring).  You feel dizzy.  You have swelling in your ankles.  You have trouble with your vision. Get help right away if:  You develop a severe headache or confusion.  You have unusual weakness or numbness.  You feel faint.  You have severe pain in your chest or abdomen.  You vomit repeatedly.  You have trouble  breathing. Summary  Hypertension is when the force of blood pumping through your arteries is too strong. If this condition is not controlled, it may put you at risk for serious complications.  Your personal target blood pressure may vary depending on your medical conditions, your age, and other factors. For most people, a normal blood pressure is less than 120/80.  Hypertension is treated with lifestyle changes, medicines, or a combination of both. Lifestyle changes include weight loss, eating a healthy, low-sodium diet, exercising more, and limiting alcohol. This information is not intended to replace advice given to you by your health care provider. Make sure you discuss any questions you have with your health care provider. Document Released: 06/10/2005 Document Revised: 05/08/2016 Document Reviewed: 05/08/2016 Elsevier Interactive Patient Education  2019 [employer] Pyelonephritis, Adult  Pyelonephritis is a kidney infection. The kidneys are organs that help clean your blood by moving waste out of your blood and into your pee (urine). This infection can happen quickly, or it can last for a long time. In most cases, it clears up with treatment and does not cause other problems. Follow these instructions at home: Medicines  Take over-the-counter and prescription medicines only as told by your doctor.  Take your antibiotic medicine as told by your doctor. Do not stop taking the medicine even if you start to feel better. General instructions  Drink enough fluid to keep your pee clear or pale yellow.  Avoid caffeine, tea, and carbonated drinks.  Pee (urinate) often. Avoid holding in pee for long periods of time.  Pee before and after sex.  After pooping (having a bowel movement), women should wipe from front to back. Use each tissue only once.  Keep all follow-up visits as told by your doctor. This is important. Contact a doctor if:  You do not feel better after 2 days.  Your  symptoms get worse.  You have a fever. Get help right away if:  You cannot take your medicine or drink fluids as told.  You have chills and shaking.  You throw up (vomit).  You have very bad pain in your side (flank) or back.  You feel very weak or you pass out (faint). This information is not intended to replace advice given to you by your health care provider. Make sure you discuss any questions you have with your health care provider. Document Released: 07/18/2004 Document Revised: 11/16/2015 Document Reviewed: 10/03/2014 Elsevier Interactive Patient Education  2019 [place] and activity as before

## 2018-10-22 NOTE — Progress Notes (Signed)
Patient regarding her allergy list.  She is not aware of allergy to Keflex. She has tolerated cefepime and cefazolin.  Discussed with pharmacy. Will give her 1 dose of Keflex here in the hospital.  If she tolerates she will be discharged home on Keflex.

## 2018-10-23 ENCOUNTER — Other Ambulatory Visit: Payer: Self-pay | Admitting: Neurology

## 2018-10-24 LAB — CULTURE, BLOOD (ROUTINE X 2): Special Requests: ADEQUATE

## 2018-10-24 NOTE — Discharge Summary (Signed)
[facility] NAME: Sara Munoz    MR#:  [id number]  DATE OF BIRTH:  [birth year]  DATE OF ADMISSION:  10/19/2018 ADMITTING PHYSICIAN: Christel Mormon, MD  DATE OF DISCHARGE: 10/22/2018  1:20 PM  PRIMARY CARE PHYSICIAN: [name], [name]   ADMISSION DIAGNOSIS:  Lower urinary tract infectious disease [N39.0] Pyelonephritis [N12] Sepsis, due to unspecified organism, unspecified whether acute organ dysfunction present ([place]) [A41.9] Severe sepsis ([place]) [A41.9, R65.20]  DISCHARGE DIAGNOSIS:  Active Problems:   Sepsis ([place])   Severe sepsis ([place])   SECONDARY DIAGNOSIS:   Past Medical History:  Diagnosis Date  . Anxiety   . Asthma   . Atypical chest pain 08/02/2015  . CAD (coronary artery disease)   . Chronic back pain   . COPD (chronic obstructive pulmonary disease) ([place])   . Coronary artery disease    a. Mild-non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/2014  . GERD (gastroesophageal reflux disease)   . Hypercholesteremia   . Hypertension   . Hypokalemia   . Hypomagnesemia 01/04/2014  . Liver disease   . MI (myocardial infarction) ([place])   . Opiate use 02/27/2016  . Osteoarthritis   . Stroke [facility])      ADMITTING HISTORY  HISTORY OF PRESENT ILLNESS:  Sara Munoz  is a 53 y.o. female with a known history of COPD, CAD, asthma, chronic pain with a history of migraine headache.  She presented to the emergency room complaining of 2 to 3-day history of abdominal pain with right lower abdomen and flank pain as well.  She has noted increased weakness and fatigue during this period with nausea and subjective fevers.  She denies experiencing chest pain.  However she is short of breath cough productive of clear mucus which is near her baseline given history of COPD.  Temperature on arrival is 100.1.  She has remained hypotensive with blood pressure ranging 80-107 over 50s to 60s.  She is completing 3 L normal saline bolus with very little improvement  related to hypotension.  Rapid COVID-19 testing is negative.  Patient denies known sick contacts or recent travel.  She has elevated creatinine 1.52 with BUN of 20 and potassium of 2.8.  Patient has no known history of renal failure.  CT abdomen demonstrates Hazy perinephric fat stranding with striated nephrogram about the right kidney, suspicious for acute pyelonephritis.` She was started on broad-spectrum IV antibiotic therapy with Azactam, Flagyl, and vancomycin in the emergency room.  Patient is being admitted to the ICU for close monitoring as she will likely require vasopressor therapy with severe sepsis.  Blood and urine cultures are pending.  She is receiving potassium replacement therapy.  HOSPITAL COURSE:   * Pyelonephritis with E. Coli bacteremia. E. coli in urine cultures.  Changed antibiotic to cefazolin. Afebrile.  Normal WBC. Patient had allergy listed to Keflex.  But she did not think she had allergy to this.  Also she had tolerated cefepime and cefazolin in the hospital. She was given a dose of Keflex prior to discharge with no problems.  Discharged home to finish course of antibiotics as oral Keflex.  *Acute hypoxic respiratory failure secondary to pulmonary edema from fluid resuscitation. Given IV Lasix and -5 L and this resolved. Weaned off oxygen She does seem to have poor pulmonary baseline with her COPD and smoking.  * Septic shock - resolved Was on Levophed and now blood pressure improved. Levophed stopped  *Mild elevation in troponin secondary demand ischemia.  No chest pain.  No EKG changes.  No significant elevation on repeat.  *Acute kidney injury secondary to septic shock has resolved.  *DVT prophylaxis with Lovenox.  In the hospital  Patient discharged home in stable condition with oral antibiotics.   CONSULTS OBTAINED:    DRUG ALLERGIES:   Allergies  Allergen Reactions  . Flexeril [Cyclobenzaprine] Hives and Swelling    Facial/lip swelling      . Levofloxacin Hives and Swelling  . Ketorolac   . Phenergan [Promethazine Hcl] Other (See Comments)    Agitation.   . Toradol [Ketorolac Tromethamine] Swelling and Other (See Comments)    Reaction:  Facial/tongue swelling  Reaction:  Facial/tongue swelling   . Tramadol Hives, Swelling and Other (See Comments)    Reaction:  Lip swelling  Reaction:  Lip swelling   . Zoloft [Sertraline Hcl] Swelling    Tongue swelling       DISCHARGE MEDICATIONS:   Allergies as of 10/22/2018      Reactions   Flexeril [cyclobenzaprine] Hives, Swelling   Facial/lip swelling      Levofloxacin Hives, Swelling   Keflex [cephalexin] Hives   Ketorolac    Phenergan [promethazine Hcl] Other (See Comments)   Agitation.    Toradol [ketorolac Tromethamine] Swelling, Other (See Comments)   Reaction:  Facial/tongue swelling  Reaction:  Facial/tongue swelling    Tramadol Hives, Swelling, Other (See Comments)   Reaction:  Lip swelling  Reaction:  Lip swelling    Zoloft [sertraline Hcl] Swelling   Tongue swelling         Medication List    TAKE these medications   albuterol 108 (90 Base) MCG/ACT inhaler Commonly known as:  VENTOLIN HFA Inhale 2 puffs into the lungs every 6 (six) hours as needed for wheezing or shortness of breath.   atorvastatin 20 MG tablet Commonly known as:  LIPITOR Take 1 tablet (20 mg total) by mouth daily.   budesonide-formoterol 160-4.5 MCG/ACT inhaler Commonly known as:  SYMBICORT Inhale 2 puffs into the lungs 2 (two) times daily.   cephALEXin 500 MG capsule Commonly known as:  KEFLEX Take 1 capsule (500 mg total) by mouth every 8 (eight) hours.   docusate sodium 100 MG capsule Commonly known as:  COLACE Take 200 mg by mouth 2 (two) times daily as needed for mild constipation.   DULoxetine 60 MG capsule Commonly known as:  Cymbalta Take 1 capsule (60 mg total) by mouth daily.   gabapentin 600 MG tablet Commonly known as:  Neurontin Take 1 tablet (600 mg  total) by mouth 3 (three) times daily.   hydrochlorothiazide 25 MG tablet Commonly known as:  HYDRODIURIL Take 1 tablet (25 mg total) by mouth daily.   ipratropium-albuterol 0.5-2.5 (3) MG/3ML Soln Commonly known as:  DUONEB Take 3 mLs by nebulization every 6 (six) hours as needed.   lisinopril 40 MG tablet Commonly known as:  ZESTRIL Take 1 tablet (40 mg total) by mouth daily.   metoprolol tartrate 25 MG tablet Commonly known as:  LOPRESSOR Take 1 tablet (25 mg total) by mouth 2 (two) times daily.   NARCAN IJ Inject as directed as needed.   nitroGLYCERIN 0.4 MG SL tablet Commonly known as:  NITROSTAT Place 1 tablet (0.4 mg total) under the tongue every 5 (five) minutes x 3 doses as needed for chest pain.   omeprazole 40 MG capsule Commonly known as:  PRILOSEC Take 1 capsule (40 mg total) by mouth daily.   ondansetron 8 MG disintegrating tablet  Commonly known as:  ZOFRAN-ODT Take 1 tablet (8 mg total) by mouth 2 (two) times daily.   Oxycodone HCl 10 MG Tabs Take 10 mg by mouth 4 (four) times daily.   rizatriptan 10 MG disintegrating tablet Commonly known as:  Maxalt-MLT Take 1 tablet (10 mg total) by mouth as needed. May repeat in 2 hours if needed   Spiriva Respimat 2.5 MCG/ACT Aers Generic drug:  Tiotropium Bromide Monohydrate TAKE 1 PUFF BY MOUTH EVERY DAY   sucralfate 1 GM/10ML suspension Commonly known as:  CARAFATE Take 10 mLs (1 g total) by mouth 4 (four) times daily.   tiZANidine 4 MG tablet Commonly known as:  ZANAFLEX Take 4 mg by mouth daily.       Today   VITAL SIGNS:  Blood pressure 118/60, pulse 85, temperature 98.2 F (36.8 C), temperature source Oral, resp. rate 18, height 5\' 4"  (1.626 m), weight 87.8 kg, SpO2 96 %.  I/O:  No intake or output data in the 24 hours ending 10/24/18 1043  PHYSICAL EXAMINATION:  Physical Exam  GENERAL:  53 y.o.-year-old patient lying in the bed with no acute distress.  LUNGS: Normal breath sounds  bilaterally, no wheezing, rales,rhonchi or crepitation. No use of accessory muscles of respiration.  CARDIOVASCULAR: S1, S2 normal. No murmurs, rubs, or gallops.  ABDOMEN: Soft, non-tender, non-distended. Bowel sounds present. No organomegaly or mass.  NEUROLOGIC: Moves all 4 extremities. PSYCHIATRIC: The patient is alert and oriented x 3.  SKIN: No obvious rash, lesion, or ulcer.   DATA REVIEW:   CBC Recent Labs  Lab 10/20/18 0338  WBC 8.5  HGB 10.7*  HCT 33.2*  PLT 77*    Chemistries  Recent Labs  Lab 10/19/18 0128  10/21/18 0309 10/22/18 0507  NA 137   < > 140 139  K 2.8*   < > 3.9 3.3*  CL 99   < > 111 102  CO2 22   < > 22 28  GLUCOSE 108*   < > 103* 89  BUN 20   < > 11 10  CREATININE 1.52*   < > 0.76 0.77  CALCIUM 8.7*   < > 8.3* 9.1  MG 1.2*   < > 2.0  --   AST 37  --   --   --   ALT 21  --   --   --   ALKPHOS 113  --   --   --   BILITOT 1.1  --   --   --    < > = values in this interval not displayed.    Cardiac Enzymes Recent Labs  Lab 10/19/18 1747  TROPONINI 0.12*    Microbiology Results  Results for orders placed or performed during the hospital encounter of 10/19/18  Blood Culture (routine x 2)     Status: Abnormal   Collection Time: 10/19/18  1:28 AM  Result Value Ref Range Status   Specimen Description   Final    BLOOD RIGHT ANTECUBITAL Performed at [facility], [address], [place], [place] [address]    Special Requests   Final    BOTTLES DRAWN AEROBIC AND ANAEROBIC Blood Culture results may not be optimal due to an excessive volume of blood received in culture bottles Performed at [facility], [address] [place], [place] [address]    Culture  Setup Time   Final    GRAM NEGATIVE RODS IN BOTH AEROBIC AND ANAEROBIC BOTTLES CRITICAL RESULT CALLED TO, READ BACK  BY AND VERIFIED WITH: Nancy Fetter PHARMD [id number] 10/20/2018 HNM Performed at [facility], [place], [place] [address]     Culture ESCHERICHIA COLI (A)  Final   Report Status 10/22/2018 FINAL  Final   Organism ID, Bacteria ESCHERICHIA COLI  Final      Susceptibility   Escherichia coli - MIC*    AMPICILLIN 16 INTERMEDIATE Intermediate     CEFAZOLIN <=4 SENSITIVE Sensitive     CEFEPIME <=1 SENSITIVE Sensitive     CEFTAZIDIME <=1 SENSITIVE Sensitive     CEFTRIAXONE <=1 SENSITIVE Sensitive     CIPROFLOXACIN <=0.25 SENSITIVE Sensitive     GENTAMICIN <=1 SENSITIVE Sensitive     IMIPENEM <=0.25 SENSITIVE Sensitive     TRIMETH/SULFA <=20 SENSITIVE Sensitive     AMPICILLIN/SULBACTAM 4 SENSITIVE Sensitive     PIP/TAZO 8 SENSITIVE Sensitive     Extended ESBL NEGATIVE Sensitive     * ESCHERICHIA COLI  Blood Culture (routine x 2)     Status: Abnormal   Collection Time: 10/19/18  1:28 AM  Result Value Ref Range Status   Specimen Description   Final    BLOOD RIGHT HAND Performed at [facility], [address], [place], [place] [address]    Special Requests   Final    BOTTLES DRAWN AEROBIC AND ANAEROBIC Blood Culture adequate volume Performed at [facility], [address] [place], [place] [address]    Culture  Setup Time   Final    AEROBIC BOTTLE ONLY GRAM NEGATIVE RODS CRITICAL VALUE NOTED.  VALUE IS CONSISTENT WITH PREVIOUSLY REPORTED AND CALLED VALUE. Performed at [facility], [address] [place], [place] [address]    Culture (A)  Final    ESCHERICHIA COLI SUSCEPTIBILITIES PERFORMED ON PREVIOUS CULTURE WITHIN THE LAST 5 DAYS. Performed at [facility], [place] [address] [place], [place] [address]    Report Status 10/24/2018 FINAL  Final  SARS Coronavirus 2 [facility] order, Performed in [place] hospital lab)     Status: None   Collection Time: 10/19/18  1:28 AM  Result Value Ref Range Status   SARS Coronavirus 2 NEGATIVE NEGATIVE Final    Comment: (NOTE) If result is NEGATIVE SARS-CoV-2 target nucleic acids are NOT DETECTED. The SARS-CoV-2 RNA is generally  detectable in upper and lower  respiratory specimens during the acute phase of infection. The lowest  concentration of SARS-CoV-2 viral copies this assay can detect is 250  copies / mL. A negative result does not preclude SARS-CoV-2 infection  and should not be used as the sole basis for treatment or other  patient management decisions.  A negative result may occur with  improper specimen collection / handling, submission of specimen other  than nasopharyngeal swab, presence of viral mutation(s) within the  areas targeted by this assay, and inadequate number of viral copies  (<250 copies / mL). A negative result must be combined with clinical  observations, patient history, and epidemiological information. If result is POSITIVE SARS-CoV-2 target nucleic acids are DETECTED. The SARS-CoV-2 RNA is generally detectable in upper and lower  respiratory specimens dur ing the acute phase of infection.  Positive  results are indicative of active infection with SARS-CoV-2.  Clinical  correlation with patient history and other diagnostic information is  necessary to determine patient infection status.  Positive results do  not rule out bacterial infection or co-infection with other viruses. If result is PRESUMPTIVE POSTIVE SARS-CoV-2 nucleic acids MAY BE PRESENT.  A presumptive positive result was obtained on the submitted specimen  and confirmed on repeat testing.  While 2019 novel coronavirus  (SARS-CoV-2) nucleic acids may be present in the submitted sample  additional confirmatory testing may be necessary for epidemiological  and / or clinical management purposes  to differentiate between  SARS-CoV-2 and other Sarbecovirus currently known to infect humans.  If clinically indicated additional testing with an alternate test  methodology [phone]) is advised. The SARS-CoV-2 RNA is generally  detectable in upper and lower respiratory sp ecimens during the acute  phase of infection. The  expected result is Negative. Fact Sheet for Patients:  StrictlyIdeas.no Fact Sheet for Healthcare Providers: BankingDealers.co.za This test is not yet approved or cleared by the [place] FDA and has been authorized for detection and/or diagnosis of SARS-CoV-2 by FDA under an Emergency Use Authorization (EUA).  This EUA will remain in effect (meaning this test can be used) for the duration of the COVID-19 declaration under Section 564(b)(1) of the Act, 21 U.S.C. section 360bbb-3(b)(1), unless the authorization is terminated or revoked sooner. Performed at [facility], [address] [place], [place] [address]   Blood Culture ID Panel (Reflexed)     Status: Abnormal   Collection Time: 10/19/18  1:28 AM  Result Value Ref Range Status   Enterococcus species NOT DETECTED NOT DETECTED Final   Listeria monocytogenes NOT DETECTED NOT DETECTED Final   Staphylococcus species NOT DETECTED NOT DETECTED Final   Staphylococcus aureus (BCID) NOT DETECTED NOT DETECTED Final   Streptococcus species NOT DETECTED NOT DETECTED Final   Streptococcus agalactiae NOT DETECTED NOT DETECTED Final   Streptococcus pneumoniae NOT DETECTED NOT DETECTED Final   Streptococcus pyogenes NOT DETECTED NOT DETECTED Final   Acinetobacter baumannii NOT DETECTED NOT DETECTED Final   Enterobacteriaceae species DETECTED (A) NOT DETECTED Final    Comment: Enterobacteriaceae represent a large family of gram-negative bacteria, not a single organism. CRITICAL RESULT CALLED TO, READ BACK BY AND VERIFIED WITH: SHEEMA HALLAJI PHARMD 0256 10/20/2018 HNM    Enterobacter cloacae complex NOT DETECTED NOT DETECTED Final   Escherichia coli DETECTED (A) NOT DETECTED Final    Comment: CRITICAL RESULT CALLED TO, READ BACK BY AND VERIFIED WITH: Nancy Fetter PHARMD 0256 10/20/2018 HNM    Klebsiella oxytoca NOT DETECTED NOT DETECTED Final   Klebsiella pneumoniae NOT DETECTED  NOT DETECTED Final   Proteus species NOT DETECTED NOT DETECTED Final   Serratia marcescens NOT DETECTED NOT DETECTED Final   Carbapenem resistance NOT DETECTED NOT DETECTED Final   Haemophilus influenzae NOT DETECTED NOT DETECTED Final   Neisseria meningitidis NOT DETECTED NOT DETECTED Final   Pseudomonas aeruginosa NOT DETECTED NOT DETECTED Final   Candida albicans NOT DETECTED NOT DETECTED Final   Candida glabrata NOT DETECTED NOT DETECTED Final   Candida krusei NOT DETECTED NOT DETECTED Final   Candida parapsilosis NOT DETECTED NOT DETECTED Final   Candida tropicalis NOT DETECTED NOT DETECTED Final    Comment: Performed at [facility], [address] [place], [place] [address]  Urine culture     Status: Abnormal   Collection Time: 10/19/18  2:15 AM  Result Value Ref Range Status   Specimen Description   Final    URINE, CATHETERIZED Performed at [facility], [address] [place], [place] [address]    Special Requests   Final    NONE Performed at [facility], [address] [place], [place] [address]    Culture >=100,000 COLONIES/mL ESCHERICHIA  COLI (A)  Final   Report Status 10/21/2018 FINAL  Final   Organism ID, Bacteria ESCHERICHIA COLI (A)  Final      Susceptibility   Escherichia coli - MIC*    AMPICILLIN 16 INTERMEDIATE Intermediate     CEFAZOLIN <=4 SENSITIVE Sensitive     CEFTRIAXONE <=1 SENSITIVE Sensitive     CIPROFLOXACIN <=0.25 SENSITIVE Sensitive     GENTAMICIN <=1 SENSITIVE Sensitive     IMIPENEM <=0.25 SENSITIVE Sensitive     NITROFURANTOIN <=16 SENSITIVE Sensitive     TRIMETH/SULFA <=20 SENSITIVE Sensitive     AMPICILLIN/SULBACTAM 8 SENSITIVE Sensitive     PIP/TAZO 8 SENSITIVE Sensitive     Extended ESBL NEGATIVE Sensitive     * >=100,000 COLONIES/mL ESCHERICHIA COLI  MRSA PCR Screening     Status: None   Collection Time: 10/19/18  8:24 AM  Result Value Ref Range Status   MRSA by PCR NEGATIVE NEGATIVE Final    Comment:         The GeneXpert MRSA Assay (FDA approved for NASAL specimens only), is one component of a comprehensive MRSA colonization surveillance program. It is not intended to diagnose MRSA infection nor to guide or monitor treatment for MRSA infections. Performed at [facility], [address] [place], [place] [address]   C difficile quick scan w PCR reflex     Status: None   Collection Time: 10/19/18 11:41 AM  Result Value Ref Range Status   C Diff antigen NEGATIVE NEGATIVE Final   C Diff toxin NEGATIVE NEGATIVE Final   C Diff interpretation No C. difficile detected.  Final    Comment: Performed at [facility], [address] [place], [place] [address]    RADIOLOGY:  No results found.  Follow up with PCP in 1 week.  Management plans discussed with the patient, family and they are in agreement.  CODE STATUS:  Code Status History    Date Active Date Inactive Code Status Order ID Comments User Context   10/19/2018 0443 10/22/2018 1740 Full Code [id number]  Mayer Camel, NP ED   03/30/2018 1711 03/31/2018 1320 Full Code [id number]  Judith Part, MD Inpatient   02/22/2016 1323 02/22/2016 1725 Full Code [id number]  Yolonda Kida, MD Inpatient   10/10/2015 0004 10/10/2015 1614 Full Code [id number]  Lance Coon, MD ED   08/02/2015 0430 08/03/2015 1707 Full Code [id number]  Saundra Shelling, MD Inpatient      TOTAL TIME TAKING CARE OF THIS PATIENT ON DAY OF DISCHARGE: more than 30 minutes.   Leia Alf Areon Cocuzza M.D on 10/24/2018 at 10:43 AM  Between 7am to 6pm - Pager - [phone]  After 6pm go to [email or web address] - password [email or web address] [place] [facility]  Office  [phone]  CC: Primary care physician; [name], [name]  Note: This dictation was prepared with Dragon dictation along with smaller phrase technology. Any transcriptional errors that result from this process are unintentional.

## 2018-10-29 ENCOUNTER — Other Ambulatory Visit: Payer: Self-pay

## 2018-10-29 ENCOUNTER — Ambulatory Visit (INDEPENDENT_AMBULATORY_CARE_PROVIDER_SITE_OTHER): Payer: Medicaid Other | Admitting: Physician Assistant

## 2018-10-29 ENCOUNTER — Encounter: Payer: Self-pay | Admitting: Physician Assistant

## 2018-10-29 VITALS — BP 110/70 | HR 64 | Temp 98.2°F | Resp 16 | Wt 183.2 lb

## 2018-10-29 DIAGNOSIS — N179 Acute kidney failure, unspecified: Secondary | ICD-10-CM

## 2018-10-29 DIAGNOSIS — R6521 Severe sepsis with septic shock: Secondary | ICD-10-CM

## 2018-10-29 DIAGNOSIS — A4151 Sepsis due to Escherichia coli [E. coli]: Secondary | ICD-10-CM

## 2018-10-29 DIAGNOSIS — Z09 Encounter for follow-up examination after completed treatment for conditions other than malignant neoplasm: Secondary | ICD-10-CM

## 2018-10-29 DIAGNOSIS — R0902 Hypoxemia: Secondary | ICD-10-CM | POA: Diagnosis not present

## 2018-10-29 NOTE — Progress Notes (Signed)
Patient: Sara Munoz Female    DOB: Aug 08, 1965   53 y.o.   MRN: [id number] Visit Date: 10/29/2018  Today's Provider: [name], [name]   Chief Complaint  Patient presents with  . Follow-up    ED follow up   Subjective:     HPI  Follow up ER visit  Patient was seen in ER for abdominal pain and flank pain occurring for 3 days on 10/19/18, discharged on 10/22/2018.  She was treated for severe sepsis 2/2 to pyelonephritis due to E. Coli. COVID testing was negative. She received IV abx including azactam, flagyl, and vancomycin while cultures were pending. Blood cultures revealed e. Coli sensitive to keflex and she was transitioned to this medication at discharge. On presentation, she had hypokalemia which resolved with potassium replacement at discharge. She also had an AKI. She required the use of vasopressors. She additionally had transient hypoxia due to fluid overload which improved with administration of Lasix.  Treatment for this included Keflex. She reports good compliance with treatment. She reports this condition is Improved, though she still feels fatigued. She was discharged with a 10 day course of Keflex, she has three more days left of this. She denies fevers, chills, nausea, vomiting, abdominal pain, back pain above baseline, dysuria, trouble breathing.   She has a history of hepatitis C, completed treatment and virus has since been undetectable. She completed an initial series of hepatitis B and did not build antibodies. She has had the 1/3 of her second hepatitis B series and is due for second dose.   ------------------------------------------------------------------------------------   Allergies  Allergen Reactions  . Flexeril [Cyclobenzaprine] Hives and Swelling    Facial/lip swelling     . Levofloxacin Hives and Swelling  . Ketorolac   . Phenergan [Promethazine Hcl] Other (See Comments)    Agitation.   . Toradol [Ketorolac Tromethamine] Swelling and  Other (See Comments)    Reaction:  Facial/tongue swelling  Reaction:  Facial/tongue swelling   . Tramadol Hives, Swelling and Other (See Comments)    Reaction:  Lip swelling  Reaction:  Lip swelling   . Zoloft [Sertraline Hcl] Swelling    Tongue swelling        Current Outpatient Medications:  .  albuterol (PROVENTIL HFA;VENTOLIN HFA) 108 (90 Base) MCG/ACT inhaler, Inhale 2 puffs into the lungs every 6 (six) hours as needed for wheezing or shortness of breath., Disp: 1 Inhaler, Rfl: 2 .  atorvastatin (LIPITOR) 20 MG tablet, Take 1 tablet (20 mg total) by mouth daily., Disp: 90 tablet, Rfl: 3 .  budesonide-formoterol (SYMBICORT) 160-4.5 MCG/ACT inhaler, Inhale 2 puffs into the lungs 2 (two) times daily., Disp: 1 Inhaler, Rfl: 0 .  cephALEXin (KEFLEX) 500 MG capsule, Take 1 capsule (500 mg total) by mouth every 8 (eight) hours., Disp: 30 capsule, Rfl: 0 .  docusate sodium (COLACE) 100 MG capsule, Take 200 mg by mouth 2 (two) times daily as needed for mild constipation., Disp: , Rfl:  .  DULoxetine (CYMBALTA) 60 MG capsule, Take 1 capsule (60 mg total) by mouth daily., Disp: 30 capsule, Rfl: 12 .  hydrochlorothiazide (HYDRODIURIL) 25 MG tablet, Take 1 tablet (25 mg total) by mouth daily., Disp: 90 tablet, Rfl: 0 .  ipratropium-albuterol (DUONEB) 0.5-2.5 (3) MG/3ML SOLN, Take 3 mLs by nebulization every 6 (six) hours as needed., Disp: 360 mL, Rfl: 5 .  lisinopril (PRINIVIL,ZESTRIL) 40 MG tablet, Take 1 tablet (40 mg total) by mouth daily., Disp: 90 tablet,  Rfl: 1 .  metoprolol tartrate (LOPRESSOR) 25 MG tablet, Take 1 tablet (25 mg total) by mouth 2 (two) times daily., Disp: 180 tablet, Rfl: 0 .  Naloxone HCl (NARCAN IJ), Inject as directed as needed., Disp: , Rfl:  .  nitroGLYCERIN (NITROSTAT) 0.4 MG SL tablet, Place 1 tablet (0.4 mg total) under the tongue every 5 (five) minutes x 3 doses as needed for chest pain., Disp: 30 tablet, Rfl: 2 .  omeprazole (PRILOSEC) 40 MG capsule, Take 1 capsule  (40 mg total) by mouth daily., Disp: 90 capsule, Rfl: 3 .  ondansetron (ZOFRAN-ODT) 8 MG disintegrating tablet, Take 1 tablet (8 mg total) by mouth 2 (two) times daily. 30-day rx. Please call [phone] to schedule appt for continued refills., Disp: 20 tablet, Rfl: 1 .  oxyCODONE-acetaminophen (PERCOCET) 10-325 MG tablet, Take 1 tablet by mouth every 6 (six) hours., Disp: , Rfl:  .  rizatriptan (MAXALT-MLT) 10 MG disintegrating tablet, Take 1 tablet (10 mg total) by mouth as needed. May repeat in 2 hours if needed, Disp: 15 tablet, Rfl: 6 .  SPIRIVA RESPIMAT 2.5 MCG/ACT AERS, TAKE 1 PUFF BY MOUTH EVERY DAY, Disp: 4 g, Rfl: 0 .  sucralfate (CARAFATE) 1 GM/10ML suspension, Take 10 mLs (1 g total) by mouth 4 (four) times daily., Disp: 420 mL, Rfl: 1 .  tiZANidine (ZANAFLEX) 4 MG tablet, Take 4 mg by mouth daily. , Disp: , Rfl:  .  gabapentin (NEURONTIN) 600 MG tablet, Take 1 tablet (600 mg total) by mouth 3 (three) times daily., Disp: 90 tablet, Rfl: 2 .  Oxycodone HCl 10 MG TABS, Take 10 mg by mouth 4 (four) times daily. , Disp: , Rfl:   Review of Systems  Constitutional: Negative.   HENT: Negative.   Respiratory: Positive for cough, shortness of breath and wheezing.   Cardiovascular: Negative.   Gastrointestinal: Positive for nausea. Negative for abdominal pain.  Musculoskeletal: Positive for arthralgias, back pain, myalgias, neck pain and neck stiffness.  Neurological: Positive for dizziness and light-headedness.    Social History   Tobacco Use  . Smoking status: Current Every Day Smoker    Packs/day: 0.50    Types: Cigarettes  . Smokeless tobacco: Never Used  Substance Use Topics  . Alcohol use: Yes    Alcohol/week: 0.0 standard drinks      Objective:   BP 110/70   Pulse 64   Temp 98.2 F (36.8 C) (Oral)   Resp 16   Wt 183 lb 3.2 oz (83.1 kg)   SpO2 90%   BMI 31.45 kg/m  Vitals:   10/29/18 1118  BP: 110/70  Pulse: 64  Resp: 16  Temp: 98.2 F (36.8 C)  TempSrc: Oral   SpO2: 90%  Weight: 183 lb 3.2 oz (83.1 kg)     Physical Exam Constitutional:      General: She is in acute distress.     Appearance: Normal appearance. She is not ill-appearing.  Cardiovascular:     Rate and Rhythm: Normal rate and regular rhythm.     Heart sounds: Normal heart sounds.  Pulmonary:     Effort: Pulmonary effort is normal.     Breath sounds: Decreased air movement present. Examination of the right-lower field reveals decreased breath sounds. Examination of the left-lower field reveals decreased breath sounds. Decreased breath sounds present. No wheezing, rhonchi or rales.  Abdominal:     General: Abdomen is flat.     Palpations: Abdomen is soft.     Tenderness: There is  no abdominal tenderness. There is no right CVA tenderness, left CVA tenderness or guarding.  Skin:    General: Skin is warm and dry.  Neurological:     Mental Status: She is alert.  Psychiatric:        Mood and Affect: Mood normal.        Behavior: Behavior normal.         Assessment & Plan    1. Hospital discharge follow-up  She is to complete her 10 day course of keflex for sepsis 2/2 E. Coli. Her CXR on 10/21/2018 showed patchy bilateral opacification in lung bases with coarse interstitial changes and small amount of left pleural fluid, findings may be due to infections vs inflammatory process and less likely vascular congestion. Her lung sounds are decreased today in the bases and I would like her to get another cxr. Her labwork had normalized upon discharge. I have reviewed the discharge summary, labwrok and imaging associated with this admission. I have reconciled her medications. Will defer hep B vaccine to next visit due to recent severe illness and current antibiotic usage.   2. Sepsis due to Escherichia coli with acute renal failure and septic shock, unspecified acute renal failure type ([place])  Complete 10 day course of keflex.   3. Hypoxia  - DG Chest 2 View; Future  F/u 3 months for  chronic conditions and hepatitis B vaccination.   The entirety of the information documented in the History of Present Illness, Review of Systems and Physical Exam were personally obtained by me. Portions of this information were initially documented by Jennings Books, CMA and reviewed by me for thoroughness and accuracy.     I,Kathleen J Wolford,acting as a Education administrator for [employer], [name].,have documented all relevant documentation on the behalf of [name], [name],as directed by  [name], [name] while in the presence of [name], [name]. [name], [name]  [facility]

## 2018-11-20 ENCOUNTER — Other Ambulatory Visit: Payer: Self-pay | Admitting: Physician Assistant

## 2018-11-20 DIAGNOSIS — I10 Essential (primary) hypertension: Secondary | ICD-10-CM

## 2018-11-24 ENCOUNTER — Ambulatory Visit
Admission: RE | Admit: 2018-11-24 | Discharge: 2018-11-24 | Disposition: A | Discharge status: Home / Self Care | Payer: Medicaid Other | Source: Ambulatory Visit | Attending: Neurological Surgery | Admitting: Neurological Surgery

## 2018-11-24 ENCOUNTER — Other Ambulatory Visit: Payer: Self-pay

## 2018-11-24 DIAGNOSIS — M5412 Radiculopathy, cervical region: Secondary | ICD-10-CM | POA: Diagnosis not present

## 2018-11-24 DIAGNOSIS — R202 Paresthesia of skin: Secondary | ICD-10-CM | POA: Diagnosis not present

## 2018-11-26 ENCOUNTER — Ambulatory Visit: Payer: Medicaid Other

## 2018-12-01 ENCOUNTER — Emergency Department
Admission: EM | Admit: 2018-12-01 | Discharge: 2018-12-01 | Disposition: A | Discharge status: Home / Self Care | Payer: Medicaid Other | Attending: Student in an Organized Health Care Education/Training Program | Admitting: Student in an Organized Health Care Education/Training Program

## 2018-12-01 ENCOUNTER — Emergency Department: Payer: Medicaid Other

## 2018-12-01 ENCOUNTER — Encounter: Payer: Self-pay | Admitting: Emergency Medicine

## 2018-12-01 ENCOUNTER — Other Ambulatory Visit: Payer: Self-pay

## 2018-12-01 DIAGNOSIS — Y92000 Kitchen of unspecified non-institutional (private) residence as  the place of occurrence of the external cause: Secondary | ICD-10-CM | POA: Insufficient documentation

## 2018-12-01 DIAGNOSIS — Z79899 Other long term (current) drug therapy: Secondary | ICD-10-CM | POA: Insufficient documentation

## 2018-12-01 DIAGNOSIS — Y939 Activity, unspecified: Secondary | ICD-10-CM | POA: Insufficient documentation

## 2018-12-01 DIAGNOSIS — Y999 Unspecified external cause status: Secondary | ICD-10-CM | POA: Insufficient documentation

## 2018-12-01 DIAGNOSIS — F17210 Nicotine dependence, cigarettes, uncomplicated: Secondary | ICD-10-CM | POA: Diagnosis not present

## 2018-12-01 DIAGNOSIS — I2510 Atherosclerotic heart disease of native coronary artery without angina pectoris: Secondary | ICD-10-CM | POA: Diagnosis not present

## 2018-12-01 DIAGNOSIS — S322XXA Fracture of coccyx, initial encounter for closed fracture: Secondary | ICD-10-CM | POA: Insufficient documentation

## 2018-12-01 DIAGNOSIS — J449 Chronic obstructive pulmonary disease, unspecified: Secondary | ICD-10-CM | POA: Diagnosis not present

## 2018-12-01 DIAGNOSIS — W1839XA Other fall on same level, initial encounter: Secondary | ICD-10-CM | POA: Insufficient documentation

## 2018-12-01 DIAGNOSIS — Z8673 Personal history of transient ischemic attack (TIA), and cerebral infarction without residual deficits: Secondary | ICD-10-CM | POA: Diagnosis not present

## 2018-12-01 DIAGNOSIS — I10 Essential (primary) hypertension: Secondary | ICD-10-CM | POA: Diagnosis not present

## 2018-12-01 DIAGNOSIS — I252 Old myocardial infarction: Secondary | ICD-10-CM | POA: Diagnosis not present

## 2018-12-01 DIAGNOSIS — S3992XA Unspecified injury of lower back, initial encounter: Secondary | ICD-10-CM | POA: Diagnosis present

## 2018-12-01 MED ORDER — HYDROCODONE-ACETAMINOPHEN 5-325 MG PO TABS: 1.0000 | ORAL_TABLET | Freq: Four times a day (QID) | ORAL | 0 refills | Status: DC | PRN

## 2018-12-01 MED ORDER — HYDROCODONE-ACETAMINOPHEN 5-325 MG PO TABS
1.0000 | ORAL_TABLET | Freq: Once | ORAL | Status: AC
  Administered 2018-12-01: 1 via ORAL
  Filled 2018-12-01: qty 1

## 2018-12-01 NOTE — ED Provider Notes (Signed)
[facility] Emergency Department Provider Note  ____________________________________________   First MD Initiated Contact with Patient 12/01/18 1340     (approximate)  I have reviewed the triage vital signs and the nursing notes.   HISTORY  Chief Complaint Fall    HPI Sara Munoz is a 53 y.o. female presents emergency department complaining of lower back pain.  She states that she fell in her kitchen on Sunday.  She has a history of syncope in which she is working with her neurologist to discover the reason why.  She denies any numbness or tingling.  She states she has severe pain in the lower part of her back.  She denies any head injury.  No chest pain, shortness of breath, or abdominal pain.    Past Medical History:  Diagnosis Date  . Anxiety   . Asthma   . Atypical chest pain 08/02/2015  . CAD (coronary artery disease)   . Chronic back pain   . COPD (chronic obstructive pulmonary disease) ([place])   . Coronary artery disease    a. Mild-non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/2014  . GERD (gastroesophageal reflux disease)   . Hypercholesteremia   . Hypertension   . Hypokalemia   . Hypomagnesemia 01/04/2014  . Liver disease   . MI (myocardial infarction) ([place])   . Opiate use 02/27/2016  . Osteoarthritis   . Stroke [facility])     Patient Active Problem List   Diagnosis Date Noted  . Sepsis ([place]) 10/19/2018  . Severe sepsis ([place]) 10/19/2018  . Cervical radiculopathy 02/12/2018  . Chronic migraine 02/12/2018  . Paresthesia 08/29/2017  . Neck pain 08/29/2017  . Daily headache 08/29/2017  . Chronic active hepatitis ([place]) 07/29/2017  . Neurogenic pain 07/31/2016  . Chronic pain syndrome 06/05/2016  . Carotid artery stenosis 02/27/2016  . Chronic constipation 02/27/2016  . Chronic nausea 02/27/2016  . Hypercholesterolemia 02/27/2016  . Osteoarthritis 02/27/2016  . PTSD (post-traumatic stress disorder) 02/27/2016  . TIA (transient ischemic attack)  02/27/2016  . Long term current use of opiate analgesic 02/27/2016  . Long term prescription opiate use 02/27/2016  . Encounter for pain management consult 02/27/2016  . Chronic hip pain (Location of Tertiary source of pain) (Bilateral) (L>R) 02/27/2016  . Chronic knee pain (Bilateral) (L>R) 02/27/2016  . Chronic shoulder pain (Bilateral) (L>R) 02/27/2016  . Chronic sacroiliac joint pain (Bilateral) (L>R) 02/27/2016  . Chronic low back pain (Location of Primary Source of Pain) (Bilateral) (L>R) 02/27/2016  . Chronic lower extremity pain (Location of Secondary source of pain) (Bilateral) (L>R) 02/27/2016  . Osteoarthritis of hip (Bilateral) (L>R) 02/27/2016  . Chronic neck pain (posterior midline) (Bilateral) (L>R) 02/27/2016  . Cervicogenic headache (Bilateral) (L>R) 02/27/2016  . Occipital headache (Bilateral) (L>R) 02/27/2016  . Chronic upper extremity pain (Bilateral) (L>R) 02/27/2016  . Chronic Cervical radicular pain (Bilateral) (L>R) 02/27/2016  . Chronic lumbar radicular pain (Right) (L5 dermatome) 02/27/2016  . Lumbar facet syndrome (Bilateral) (L>R) 02/27/2016  . Long term prescription benzodiazepine use 02/27/2016  . Hypertension   . COPD (chronic obstructive pulmonary disease) ([place]) 10/09/2015  . GERD (gastroesophageal reflux disease) 10/09/2015  . Non-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/2014 08/02/2015  . Hyperlipidemia 08/02/2015  . Costochondritis 08/02/2015  . 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 GAD (generalized anxiety disorder) 09/03/2014  . Narcotic dependence ([place]) 09/03/2014  . PVD (peripheral vascular disease) ([place]) 09/03/2014  . Smoker 09/03/2014  . Cigarette nicotine dependence with nicotine-induced disorder 07/08/2014  . Anemia of chronic  disease 03/19/2014  . Narcotic abuse, continuous ([place]) 03/19/2014  . Polysubstance abuse ([place]) 03/19/2014  . MSSA (methicillin susceptible Staphylococcus aureus) septicemia ([place]) 03/07/2014  . Hypomagnesemia 01/04/2014  . Chronic  cough 09/04/2011  . Sleep apnea, obstructive 09/04/2011    Past Surgical History:  Procedure Laterality Date  . ANTERIOR CERVICAL DECOMP/DISCECTOMY FUSION N/A 03/30/2018   Procedure: Cervical six-seven Anterior cervical decompression/discectomy/fusion;  Surgeon: Judith Part, MD;  Location: [facility];  Service: Neurosurgery;  Laterality: N/A;  . BACK SURGERY    . CARDIAC CATHETERIZATION Left 02/22/2016   Procedure: Left Heart Cath and Coronary Angiography;  Surgeon: Yolonda Kida, MD;  Location: [facility];  Service: Cardiovascular;  Laterality: Left;  . COLONOSCOPY WITH PROPOFOL N/A 09/19/2016   Procedure: COLONOSCOPY WITH PROPOFOL;  Surgeon: [name], MD;  Location: [facility] ENDOSCOPY;  Service: Endoscopy;  Laterality: N/A;  . ESOPHAGOGASTRODUODENOSCOPY (EGD) WITH PROPOFOL N/A 09/18/2017   Procedure: ESOPHAGOGASTRODUODENOSCOPY (EGD) WITH PROPOFOL;  Surgeon: [name], MD;  Location: [facility] ENDOSCOPY;  Service: Gastroenterology;  Laterality: N/A;  . hemorroids    . NASAL SINUS SURGERY    . ORIF FEMUR FRACTURE    . ORIF TIBIA & FIBULA FRACTURES    . OVARY SURGERY      Prior to Admission medications   Medication Sig Start Date End Date Taking? Authorizing Provider  albuterol (PROVENTIL HFA;VENTOLIN HFA) 108 (90 Base) MCG/ACT inhaler Inhale 2 puffs into the lungs every 6 (six) hours as needed for wheezing or shortness of breath. 05/26/17   [name], [name]  atorvastatin (LIPITOR) 20 MG tablet Take 1 tablet (20 mg total) by mouth daily. 07/09/18   [name], [name]  budesonide-formoterol (SYMBICORT) 160-4.5 MCG/ACT inhaler Inhale 2 puffs into the lungs 2 (two) times daily. 05/26/17   [name], [name]  cephALEXin (KEFLEX) 500 MG capsule Take 1 capsule (500 mg total) by mouth every 8 (eight) hours. 10/22/18   Hillary Bow, MD  docusate sodium (COLACE) 100 MG capsule Take 200 mg by mouth 2 (two) times daily as needed for mild constipation.    [provider]  DULoxetine (CYMBALTA) 60 MG capsule Take 1 capsule (60 mg total) by mouth daily. 02/12/18   [name], MD  gabapentin (NEURONTIN) 600 MG tablet Take 1 tablet (600 mg total) by mouth 3 (three) times daily. 06/26/17 10/19/18  [name], [name]  hydrochlorothiazide (HYDRODIURIL) 25 MG tablet Take 1 tablet (25 mg total) by mouth daily. 06/23/18   [name], [name]  HYDROcodone-acetaminophen (NORCO/VICODIN) 5-325 MG tablet Take 1 tablet by mouth every 6 (six) hours as needed for moderate pain. 12/01/18   Cornisha Zetino, Linden Dolin, [name]  ipratropium-albuterol (DUONEB) 0.5-2.5 (3) MG/3ML SOLN Take 3 mLs by nebulization every 6 (six) hours as needed. 07/17/18   Laverle Hobby, MD  lisinopril (PRINIVIL,ZESTRIL) 40 MG tablet Take 1 tablet (40 mg total) by mouth daily. 08/05/17   [name], [name]  metoprolol tartrate (LOPRESSOR) 25 MG tablet TAKE 1 TABLET BY MOUTH TWICE A DAY 11/23/18   Carles Collet M, [name]  Naloxone HCl (NARCAN IJ) Inject as directed as needed.    [provider]  nitroGLYCERIN (NITROSTAT) 0.4 MG SL tablet Place 1 tablet (0.4 mg total) under the tongue every 5 (five) minutes x 3 doses as needed for chest pain. 08/03/15   Demetrios Loll, MD  omeprazole (PRILOSEC) 40 MG capsule Take 1 capsule (40 mg total) by mouth daily. 05/19/18   [name], MD  ondansetron (  ZOFRAN-ODT) 8 MG disintegrating tablet Take 1 tablet (8 mg total) by mouth 2 (two) times daily. 30-day rx. Please call [phone] to schedule appt for continued refills. 10/26/18   [name], MD  Oxycodone HCl 10 MG TABS Take 10 mg by mouth 4 (four) times daily.  06/19/18   [provider]  oxyCODONE-acetaminophen (PERCOCET) 10-325 MG tablet Take 1 tablet by mouth every 6 (six) hours. 10/15/18   [provider]  rizatriptan (MAXALT-MLT) 10 MG disintegrating tablet Take 1 tablet (10 mg total) by mouth as needed. May repeat in 2 hours if needed 02/12/18   [name], MD  SPIRIVA RESPIMAT 2.5 MCG/ACT AERS TAKE 1  PUFF BY MOUTH EVERY DAY 09/21/18   [name], [name]  sucralfate (CARAFATE) 1 GM/10ML suspension Take 10 mLs (1 g total) by mouth 4 (four) times daily. 05/19/18   [name], MD  tiZANidine (ZANAFLEX) 4 MG tablet Take 4 mg by mouth daily.     [provider]    Allergies Flexeril [cyclobenzaprine]; Levofloxacin; Ketorolac; Phenergan [promethazine hcl]; Toradol [ketorolac tromethamine]; Tramadol; and Zoloft [sertraline hcl]  Family History  Problem Relation Age of Onset  . Heart disease Mother   . Lung cancer Mother   . Ovarian cancer Mother   . Healthy Brother   . Healthy Brother   . Healthy Brother   . Diabetes Maternal Uncle   . Breast cancer Maternal Aunt     Social History Social History   Tobacco Use  . Smoking status: Current Every Day Smoker    Packs/day: 0.50    Types: Cigarettes  . Smokeless tobacco: Never Used  Substance Use Topics  . Alcohol use: Yes    Alcohol/week: 0.0 standard drinks  . Drug use: Yes    Types: Marijuana    Comment: 2 months ago     Review of Systems  Constitutional: No fever/chills Eyes: No visual changes. ENT: No sore throat. Respiratory: Denies cough Genitourinary: Negative for dysuria. Musculoskeletal: Positive for back pain. Skin: Negative for rash.    ____________________________________________   PHYSICAL EXAM:  VITAL SIGNS: ED Triage Vitals  Enc Vitals Group     BP 12/01/18 1319 (!) 168/98     Pulse Rate 12/01/18 1319 91     Resp 12/01/18 1319 18     Temp 12/01/18 1319 98.1 F (36.7 C)     Temp Source 12/01/18 1319 Oral     SpO2 12/01/18 1319 98 %     Weight 12/01/18 1319 180 lb (81.6 kg)     Height 12/01/18 1319 5\' 4"  (1.626 m)     Head Circumference --      Peak Flow --      Pain Score 12/01/18 1320 8     Pain Loc --      Pain Edu? --      Excl. in [place]? --     Constitutional: Alert and oriented. Well appearing and in no acute distress. Eyes: Conjunctivae are normal.  Head: Atraumatic.  Nose: No congestion/rhinnorhea. Mouth/Throat: Mucous membranes are moist.   Neck:  supple no lymphadenopathy noted Cardiovascular: Normal rate, regular rhythm. Heart sounds are normal Respiratory: Normal respiratory effort.  No retractions, lungs c t a  Abd: soft nontender bs normal all 4 quad GU: deferred Musculoskeletal: FROM all extremities, warm and well perfused, lumbar spine and sacrum/coccyx are tender to palpation.  No bruising is noted on the coccyx.  Bruising noted on the arms. Neurologic:  Normal speech and language.  Skin:  Skin is warm, dry and intact. No rash noted. Psychiatric: Mood and affect are normal. Speech and behavior are normal.  ____________________________________________   LABS (all labs ordered are listed, but only abnormal results are displayed)  Labs Reviewed - No data to display ____________________________________________   ____________________________________________  RADIOLOGY  Lumbar spine is negative for fracture Sacrum/coccyx is positive for displaced fracture of the coccyx  ____________________________________________   PROCEDURES  Procedure(s) performed: vicodin 1 po  Procedures    ____________________________________________   INITIAL IMPRESSION / ASSESSMENT AND PLAN / ED COURSE  Pertinent labs & imaging results that were available during my care of the patient were reviewed by me and considered in my medical decision making (see chart for details).   Patient is 53 year old female presents emergency department after a fall Sunday.  She complains of lower back pain and coccyx pain.  Physical exam patient has bruises on her arms.  Lumbar spine and coccyx are tender to palpation.  Patient has full strength in lower extremities.  X-ray lumbar spine and sacrum/coccyx ordered Patient was given Vicodin 1 p.o.   X-ray of lumbar spine is negative, x-ray of the sacrum/coccyx shows a displaced fracture.  Explained the findings to the  patient.  She had been given 1 Vicodin p.o. she states it is barely helping.  Explained to her being on pain contract I do not mind giving her a prescription but that she will need to clear this with her pain clinic physician.  She states she will make sure it is okay for her to get this filled as she does not want to endanger her pain contract with her physician.  She is to apply ice to the area.  Sit on a soft cushion or donut shaped pillow.  Take a stool softener to prevent constipation.  Return if worsening.  As part of my medical decision making, I reviewed the following data within the [facility] notes reviewed and incorporated, Old chart reviewed, Radiograph reviewed x-ray lumbar spine is normal, x-ray of sacrum and coccyx shows a coccyx fracture, Notes from prior ED visits and [place] Controlled Substance Database  ____________________________________________   FINAL CLINICAL IMPRESSION(S) / ED DIAGNOSES  Final diagnoses:  Closed fracture of coccyx, initial encounter ([place])      NEW MEDICATIONS STARTED DURING THIS VISIT:  Discharge Medication List as of 12/01/2018  3:10 PM    START taking these medications   Details  HYDROcodone-acetaminophen (NORCO/VICODIN) 5-325 MG tablet Take 1 tablet by mouth every 6 (six) hours as needed for moderate pain., Starting Tue 12/01/2018, Print         Note:  This document was prepared using Dragon voice recognition software and may include unintentional dictation errors.    Versie Starks, [name] 12/01/18 1655    Merlyn Lot, MD 12/01/18 [phone]

## 2018-12-01 NOTE — Discharge Instructions (Addendum)
Follow-up with your regular doctor or Dr. Prince Rome.  Please call for an appointment.  Apply ice to the area.  Please sit on a soft pillow or donut shaped pillow to prevent pressure on the tailbone.  Call your pain clinic doctor to let them know you have had a tailbone fracture.  Return emergency department if worsening.

## 2018-12-01 NOTE — ED Triage Notes (Signed)
Pt arrives post mechanical fall yesterday with complaints of "tailbnone pain."

## 2018-12-01 NOTE — ED Notes (Signed)
See triage note  Presents s/p fall  States she fell in her kitchen on Sunday  Having pain to [place] she may have hit her head   Ambulates slowly d/t pain

## 2018-12-04 ENCOUNTER — Other Ambulatory Visit: Payer: Self-pay

## 2018-12-08 ENCOUNTER — Telehealth: Payer: Self-pay | Admitting: Internal Medicine

## 2018-12-08 NOTE — Telephone Encounter (Signed)
Called patient for COVID-19 pre-screening for in office visit. ° °Have you recently traveled any where out of the local area in the last 2 weeks? No  °Have you been in close contact with a person diagnosed with COVID-19 within the last 2 weeks? No  °Do you currently have any of the following symptoms? If so, when did they start? °Cough     Diarrhea  Joint Pain °Fever      Muscle Pain  Red eyes °Shortness of breath   Abdominal pain Vomiting °Loss of smell    Rash   Sore Throat °Headache    Weakness  Bruising or bleeding ° ° °Okay to proceed with visit 12/08/2018 °  ° ° °

## 2018-12-09 ENCOUNTER — Other Ambulatory Visit: Payer: Self-pay

## 2018-12-09 ENCOUNTER — Ambulatory Visit: Payer: Medicaid Other | Admitting: Internal Medicine

## 2018-12-09 NOTE — Progress Notes (Deleted)
[facility] Consultation      Assessment and Plan:  Acute bronchitis with acute cough. - Patient had a acute cough, which came on after URTI. - Discussed that this cough may take up to 3 months to resolve, in the meantime we will treated symptomatically. -She is asked to use Robitussin-DM 3 times daily, I offered to give her a prescription for Tessalon, but she tells me that this is too expensive and is not covered by her insurance. - She has a nebulizer at home without medications, I given her prescription for duo nebs to be used as needed, as often as every 4 hours.  Chronic bronchitis. - Likely secondary to smoking. - Continue Spiriva, Symbicort, albuterol. - We will check a lung function test in 3 months.  Nicotine abuse. - Smokes 3 packs/day, currently down to 1 pack/day. - Discussed the importance workstation, spent 3 minutes in discussion, she is not currently serious about quitting.  Obstructive sleep apnea. - Discussed the importance of using CPAP nightly.  Date: 12/09/2018  MRN# [id number] Sara Munoz 1965/08/18   [name] is a 53 y.o. old female seen in consultation for chief complaint of:    No chief complaint on file.   HPI:  Sara Munoz is a 53 y.o. female with chronic bronchitis and sleep apnea.  Last visit she was recovering from an acute bronchitis with a postinfectious cough.  She was advised to use Spiriva, Symbicort, nebulizer as needed.  She was asked to continue using CPAP nightly.  She was sent for a PFT.  She has been having symptoms of chronic bronchitis, it has been present for about a month, it started as a cold, and the cold went away but the cough stayed.  This has happened a few times last year. She is smoking about 1 ppd, down from 3 ppd, decreased about 3 weeks. She got patches but has not started them yet.  She has 3 dogs, they all sleep in bed with her.  She has reflux, she takes omeprazole which controls it.   Thus far, she has tried mucinex, theraflu, cough drops, they did not help.  She takes spiriva daily, symbicort 2 puffs twice daily, proair now every day. She has a neb machine, but she does not have the medicine. She was using it 2-3 times per day.    **Chest x-ray 06/13/2018>> imaging personally reviewed.  Mildly elevated right diaphragm, with some changes of chronic bronchitis.  Lungs are otherwise unremarkable.  Medication:    Current Outpatient Medications:  .  albuterol (PROVENTIL HFA;VENTOLIN HFA) 108 (90 Base) MCG/ACT inhaler, Inhale 2 puffs into the lungs every 6 (six) hours as needed for wheezing or shortness of breath., Disp: 1 Inhaler, Rfl: 2 .  atorvastatin (LIPITOR) 20 MG tablet, Take 1 tablet (20 mg total) by mouth daily., Disp: 90 tablet, Rfl: 3 .  budesonide-formoterol (SYMBICORT) 160-4.5 MCG/ACT inhaler, Inhale 2 puffs into the lungs 2 (two) times daily., Disp: 1 Inhaler, Rfl: 0 .  cephALEXin (KEFLEX) 500 MG capsule, Take 1 capsule (500 mg total) by mouth every 8 (eight) hours., Disp: 30 capsule, Rfl: 0 .  docusate sodium (COLACE) 100 MG capsule, Take 200 mg by mouth 2 (two) times daily as needed for mild constipation., Disp: , Rfl:  .  DULoxetine (CYMBALTA) 60 MG capsule, Take 1 capsule (60 mg total) by mouth daily., Disp: 30 capsule, Rfl: 12 .  gabapentin (NEURONTIN) 600 MG tablet, Take 1 tablet (600 mg total)  by mouth 3 (three) times daily., Disp: 90 tablet, Rfl: 2 .  hydrochlorothiazide (HYDRODIURIL) 25 MG tablet, Take 1 tablet (25 mg total) by mouth daily., Disp: 90 tablet, Rfl: 0 .  HYDROcodone-acetaminophen (NORCO/VICODIN) 5-325 MG tablet, Take 1 tablet by mouth every 6 (six) hours as needed for moderate pain., Disp: 15 tablet, Rfl: 0 .  ipratropium-albuterol (DUONEB) 0.5-2.5 (3) MG/3ML SOLN, Take 3 mLs by nebulization every 6 (six) hours as needed., Disp: 360 mL, Rfl: 5 .  lisinopril (PRINIVIL,ZESTRIL) 40 MG tablet, Take 1 tablet (40 mg total) by mouth daily., Disp: 90  tablet, Rfl: 1 .  metoprolol tartrate (LOPRESSOR) 25 MG tablet, TAKE 1 TABLET BY MOUTH TWICE A DAY, Disp: 180 tablet, Rfl: 0 .  Naloxone HCl (NARCAN IJ), Inject as directed as needed., Disp: , Rfl:  .  nitroGLYCERIN (NITROSTAT) 0.4 MG SL tablet, Place 1 tablet (0.4 mg total) under the tongue every 5 (five) minutes x 3 doses as needed for chest pain., Disp: 30 tablet, Rfl: 2 .  omeprazole (PRILOSEC) 40 MG capsule, Take 1 capsule (40 mg total) by mouth daily., Disp: 90 capsule, Rfl: 3 .  ondansetron (ZOFRAN-ODT) 8 MG disintegrating tablet, Take 1 tablet (8 mg total) by mouth 2 (two) times daily. 30-day rx. Please call [phone] to schedule appt for continued refills., Disp: 20 tablet, Rfl: 1 .  Oxycodone HCl 10 MG TABS, Take 10 mg by mouth 4 (four) times daily. , Disp: , Rfl:  .  oxyCODONE-acetaminophen (PERCOCET) 10-325 MG tablet, Take 1 tablet by mouth every 6 (six) hours., Disp: , Rfl:  .  rizatriptan (MAXALT-MLT) 10 MG disintegrating tablet, Take 1 tablet (10 mg total) by mouth as needed. May repeat in 2 hours if needed, Disp: 15 tablet, Rfl: 6 .  SPIRIVA RESPIMAT 2.5 MCG/ACT AERS, TAKE 1 PUFF BY MOUTH EVERY DAY, Disp: 4 g, Rfl: 0 .  sucralfate (CARAFATE) 1 GM/10ML suspension, Take 10 mLs (1 g total) by mouth 4 (four) times daily., Disp: 420 mL, Rfl: 1 .  tiZANidine (ZANAFLEX) 4 MG tablet, Take 4 mg by mouth daily. , Disp: , Rfl:    Allergies:  Flexeril [cyclobenzaprine], Levofloxacin, Ketorolac, Phenergan [promethazine hcl], Toradol [ketorolac tromethamine], Tramadol, and Zoloft [sertraline hcl]    LABORATORY PANEL:   CBC No results for input(s): WBC, HGB, HCT, PLT in the last 168 hours. ------------------------------------------------------------------------------------------------------------------  Chemistries  No results for input(s): NA, K, CL, CO2, GLUCOSE, BUN, CREATININE, CALCIUM, MG, AST, ALT, ALKPHOS, BILITOT in the last 168 hours.  Invalid input(s): GFRCGP  ------------------------------------------------------------------------------------------------------------------  Cardiac Enzymes No results for input(s): TROPONINI in the last 168 hours. ------------------------------------------------------------  RADIOLOGY:  No results found.     Thank  you for the consultation and for allowing [facility] to assist in the care of your patient. Our recommendations are noted above.  Please contact us if we can be of further service.   Marda Stalker, M.D., F.C.C.P.  Board Certified in Internal Medicine, Pulmonary Medicine, [place], and Sleep Medicine.  [facility] Office Number: [phone]   12/09/2018

## 2018-12-10 DIAGNOSIS — M545 Low back pain: Secondary | ICD-10-CM | POA: Diagnosis not present

## 2018-12-10 DIAGNOSIS — M25511 Pain in right shoulder: Secondary | ICD-10-CM | POA: Diagnosis not present

## 2018-12-10 DIAGNOSIS — M25512 Pain in left shoulder: Secondary | ICD-10-CM | POA: Diagnosis not present

## 2018-12-10 DIAGNOSIS — M542 Cervicalgia: Secondary | ICD-10-CM | POA: Diagnosis not present

## 2018-12-13 ENCOUNTER — Other Ambulatory Visit: Payer: Self-pay | Admitting: Physician Assistant

## 2018-12-13 DIAGNOSIS — J432 Centrilobular emphysema: Secondary | ICD-10-CM

## 2018-12-14 ENCOUNTER — Other Ambulatory Visit: Payer: Self-pay | Admitting: Neurology

## 2018-12-21 ENCOUNTER — Other Ambulatory Visit
Admission: RE | Admit: 2018-12-21 | Discharge: 2018-12-21 | Disposition: A | Discharge status: Home / Self Care | Payer: Medicaid Other | Source: Ambulatory Visit | Attending: Internal Medicine | Admitting: Internal Medicine

## 2018-12-21 ENCOUNTER — Other Ambulatory Visit: Payer: Self-pay

## 2018-12-21 ENCOUNTER — Other Ambulatory Visit: Payer: Self-pay | Admitting: Neurology

## 2018-12-21 DIAGNOSIS — Z01812 Encounter for preprocedural laboratory examination: Secondary | ICD-10-CM | POA: Diagnosis not present

## 2018-12-21 DIAGNOSIS — Z1159 Encounter for screening for other viral diseases: Secondary | ICD-10-CM | POA: Diagnosis not present

## 2018-12-22 LAB — NOVEL CORONAVIRUS, NAA (HOSP ORDER, SEND-OUT TO REF LAB; TAT 18-24 HRS): SARS-CoV-2, NAA: NOT DETECTED

## 2018-12-23 ENCOUNTER — Other Ambulatory Visit: Payer: Self-pay | Admitting: Neurology

## 2018-12-23 ENCOUNTER — Other Ambulatory Visit: Payer: Self-pay | Admitting: Physician Assistant

## 2018-12-23 MED ORDER — ONDANSETRON 8 MG PO TBDP: 8.0000 mg | ORAL_TABLET | Freq: Two times a day (BID) | ORAL | 1 refills | Status: DC

## 2018-12-23 NOTE — Telephone Encounter (Signed)
[facility] faxed refill request for the following medications:  ondansetron (ZOFRAN-ODT) 8 MG disintegrating tablet    Please advise.

## 2018-12-24 ENCOUNTER — Encounter: Payer: Self-pay | Admitting: Emergency Medicine

## 2018-12-24 ENCOUNTER — Emergency Department
Admission: EM | Admit: 2018-12-24 | Discharge: 2018-12-24 | Disposition: A | Discharge status: Home / Self Care | Payer: Medicaid Other | Attending: Emergency Medicine | Admitting: Emergency Medicine

## 2018-12-24 ENCOUNTER — Emergency Department: Payer: Medicaid Other

## 2018-12-24 ENCOUNTER — Ambulatory Visit: Payer: Medicaid Other | Attending: Internal Medicine

## 2018-12-24 ENCOUNTER — Other Ambulatory Visit: Payer: Self-pay

## 2018-12-24 DIAGNOSIS — J45909 Unspecified asthma, uncomplicated: Secondary | ICD-10-CM | POA: Diagnosis not present

## 2018-12-24 DIAGNOSIS — G8929 Other chronic pain: Secondary | ICD-10-CM | POA: Insufficient documentation

## 2018-12-24 DIAGNOSIS — R05 Cough: Secondary | ICD-10-CM | POA: Diagnosis not present

## 2018-12-24 DIAGNOSIS — R0602 Shortness of breath: Secondary | ICD-10-CM | POA: Diagnosis not present

## 2018-12-24 DIAGNOSIS — J449 Chronic obstructive pulmonary disease, unspecified: Secondary | ICD-10-CM | POA: Insufficient documentation

## 2018-12-24 DIAGNOSIS — R55 Syncope and collapse: Secondary | ICD-10-CM

## 2018-12-24 DIAGNOSIS — E860 Dehydration: Secondary | ICD-10-CM | POA: Diagnosis not present

## 2018-12-24 DIAGNOSIS — Z79899 Other long term (current) drug therapy: Secondary | ICD-10-CM | POA: Insufficient documentation

## 2018-12-24 DIAGNOSIS — Z8673 Personal history of transient ischemic attack (TIA), and cerebral infarction without residual deficits: Secondary | ICD-10-CM | POA: Insufficient documentation

## 2018-12-24 DIAGNOSIS — F17210 Nicotine dependence, cigarettes, uncomplicated: Secondary | ICD-10-CM | POA: Insufficient documentation

## 2018-12-24 DIAGNOSIS — R079 Chest pain, unspecified: Secondary | ICD-10-CM | POA: Diagnosis not present

## 2018-12-24 DIAGNOSIS — M542 Cervicalgia: Secondary | ICD-10-CM | POA: Diagnosis not present

## 2018-12-24 DIAGNOSIS — I10 Essential (primary) hypertension: Secondary | ICD-10-CM | POA: Diagnosis not present

## 2018-12-24 DIAGNOSIS — M545 Low back pain: Secondary | ICD-10-CM | POA: Diagnosis present

## 2018-12-24 DIAGNOSIS — I2510 Atherosclerotic heart disease of native coronary artery without angina pectoris: Secondary | ICD-10-CM | POA: Diagnosis not present

## 2018-12-24 DIAGNOSIS — R0902 Hypoxemia: Secondary | ICD-10-CM | POA: Diagnosis not present

## 2018-12-24 LAB — URINALYSIS, COMPLETE (UACMP) WITH MICROSCOPIC
Bacteria, UA: NONE SEEN
Bilirubin Urine: NEGATIVE
Glucose, UA: NEGATIVE mg/dL
Hgb urine dipstick: NEGATIVE
Ketones, ur: NEGATIVE mg/dL
Nitrite: NEGATIVE
Protein, ur: NEGATIVE mg/dL
Specific Gravity, Urine: 1.003 — ABNORMAL LOW (ref 1.005–1.030)
pH: 6 (ref 5.0–8.0)

## 2018-12-24 LAB — CBC WITH DIFFERENTIAL/PLATELET
Abs Immature Granulocytes: 0.04 10*3/uL (ref 0.00–0.07)
Basophils Absolute: 0.1 10*3/uL (ref 0.0–0.1)
Basophils Relative: 1 %
Eosinophils Absolute: 0.2 10*3/uL (ref 0.0–0.5)
Eosinophils Relative: 3 %
HCT: 41.5 % (ref 36.0–46.0)
Hemoglobin: 13.4 g/dL (ref 12.0–15.0)
Immature Granulocytes: 1 %
Lymphocytes Relative: 36 %
Lymphs Abs: 2.5 10*3/uL (ref 0.7–4.0)
MCH: 29.7 pg (ref 26.0–34.0)
MCHC: 32.3 g/dL (ref 30.0–36.0)
MCV: 92 fL (ref 80.0–100.0)
Monocytes Absolute: 0.5 10*3/uL (ref 0.1–1.0)
Monocytes Relative: 6 %
Neutro Abs: 3.8 10*3/uL (ref 1.7–7.7)
Neutrophils Relative %: 53 %
Platelets: 208 10*3/uL (ref 150–400)
RBC: 4.51 MIL/uL (ref 3.87–5.11)
RDW: 16.2 % — ABNORMAL HIGH (ref 11.5–15.5)
WBC: 7.1 10*3/uL (ref 4.0–10.5)
nRBC: 0 % (ref 0.0–0.2)

## 2018-12-24 LAB — BLOOD GAS, VENOUS
Acid-base deficit: 1.4 mmol/L (ref 0.0–2.0)
Bicarbonate: 26 mmol/L (ref 20.0–28.0)
O2 Saturation: 46.4 %
Patient temperature: 37
pCO2, Ven: 54 mmHg (ref 44.0–60.0)
pH, Ven: 7.29 (ref 7.250–7.430)
pO2, Ven: <31 mmHg — CL (ref 32.0–45.0)

## 2018-12-24 LAB — BASIC METABOLIC PANEL
Anion gap: 10 (ref 5–15)
BUN: 11 mg/dL (ref 6–20)
CO2: 24 mmol/L (ref 22–32)
Calcium: 9.1 mg/dL (ref 8.9–10.3)
Chloride: 105 mmol/L (ref 98–111)
Creatinine, Ser: 0.65 mg/dL (ref 0.44–1.00)
GFR calc Af Amer: >60 mL/min (ref >60)
GFR calc non Af Amer: >60 mL/min (ref >60)
Glucose, Bld: 91 mg/dL (ref 70–99)
Potassium: 3.7 mmol/L (ref 3.5–5.1)
Sodium: 139 mmol/L (ref 135–145)

## 2018-12-24 MED ORDER — PREDNISONE 20 MG PO TABS: 40.0000 mg | ORAL_TABLET | Freq: Every day | ORAL | 0 refills | Status: AC

## 2018-12-24 MED ORDER — OXYCODONE-ACETAMINOPHEN 5-325 MG PO TABS
1.0000 | ORAL_TABLET | Freq: Once | ORAL | Status: AC
  Administered 2018-12-24: 09:00:00 1 via ORAL
  Filled 2018-12-24: qty 1

## 2018-12-24 MED ORDER — SODIUM CHLORIDE 0.9 % IV BOLUS
1000.0000 mL | Freq: Once | INTRAVENOUS | Status: AC
  Administered 2018-12-24: 1000 mL via INTRAVENOUS

## 2018-12-24 MED ORDER — IPRATROPIUM-ALBUTEROL 0.5-2.5 (3) MG/3ML IN SOLN
3.0000 mL | Freq: Once | RESPIRATORY_TRACT | Status: AC
  Administered 2018-12-24: 3 mL via RESPIRATORY_TRACT
  Filled 2018-12-24: qty 3

## 2018-12-24 MED ORDER — METHYLPREDNISOLONE SODIUM SUCC 125 MG IJ SOLR
125.0000 mg | Freq: Once | INTRAMUSCULAR | Status: AC
  Administered 2018-12-24: 125 mg via INTRAVENOUS
  Filled 2018-12-24: qty 2

## 2018-12-24 MED ORDER — ALBUTEROL SULFATE HFA 108 (90 BASE) MCG/ACT IN AERS: 2.0000 | INHALATION_SPRAY | RESPIRATORY_TRACT | 0 refills | Status: DC | PRN

## 2018-12-24 NOTE — ED Provider Notes (Signed)
[facility] Emergency Department Provider Note  ____________________________________________   First MD Initiated Contact with Patient 12/24/18 0701     (approximate)  I have reviewed the triage vital signs and the nursing notes.   HISTORY  Chief Complaint Back Pain and Neck Pain    HPI Sara Munoz is a 53 y.o. female here with multiple complaints.  Patient initially complained of acute on chronic neck and back pain as well as coccyx pain due to recent fracture.  However, on my assessment, patient states she is actually here because she passed out yesterday.  She has a history of recurrent syncope, and states she has been worked up for it.  However, she states that over the last several weeks, she has had multiple episodes in which she begins to feel lightheaded.  This seems to occur when she stands up, but also when she has acute on chronic exacerbations of her neck pain.  She then feels like she is going to pass out.  Sometimes, she is able to catch herself and lay flat, but often passes out.  She had associated generalized fatigue.  She states that her neck pain is been keeping her up at night and she is not been sleeping or eating well.  She states she only eats approximately 1 meal a day.  She otherwise denies any acute changes in her chronic pain.  No chest pain.  No abdominal pain, nausea, vomiting, or diarrhea.  No recent fevers or chills.  No other medical complaints.  The symptoms always come with a prodrome and she denies any associated chest pain, palpitations, or other acute complaints.        Past Medical History:  Diagnosis Date  . Anxiety   . Asthma   . Atypical chest pain 08/02/2015  . CAD (coronary artery disease)   . Chronic back pain   . COPD (chronic obstructive pulmonary disease) ([place])   . Coronary artery disease    a. Mild-non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/2014  . GERD (gastroesophageal reflux disease)   . Hypercholesteremia   .  Hypertension   . Hypokalemia   . Hypomagnesemia 01/04/2014  . Liver disease   . MI (myocardial infarction) ([place])   . Opiate use 02/27/2016  . Osteoarthritis   . Stroke [facility])     Patient Active Problem List   Diagnosis Date Noted  . Sepsis ([place]) 10/19/2018  . Severe sepsis ([place]) 10/19/2018  . Cervical radiculopathy 02/12/2018  . Chronic migraine 02/12/2018  . Paresthesia 08/29/2017  . Neck pain 08/29/2017  . Daily headache 08/29/2017  . Chronic active hepatitis ([place]) 07/29/2017  . Neurogenic pain 07/31/2016  . Chronic pain syndrome 06/05/2016  . Carotid artery stenosis 02/27/2016  . Chronic constipation 02/27/2016  . Chronic nausea 02/27/2016  . Hypercholesterolemia 02/27/2016  . Osteoarthritis 02/27/2016  . PTSD (post-traumatic stress disorder) 02/27/2016  . TIA (transient ischemic attack) 02/27/2016  . Long term current use of opiate analgesic 02/27/2016  . Long term prescription opiate use 02/27/2016  . Encounter for pain management consult 02/27/2016  . Chronic hip pain (Location of Tertiary source of pain) (Bilateral) (L>R) 02/27/2016  . Chronic knee pain (Bilateral) (L>R) 02/27/2016  . Chronic shoulder pain (Bilateral) (L>R) 02/27/2016  . Chronic sacroiliac joint pain (Bilateral) (L>R) 02/27/2016  . Chronic low back pain (Location of Primary Source of Pain) (Bilateral) (L>R) 02/27/2016  . Chronic lower extremity pain (Location of Secondary source of pain) (Bilateral) (L>R) 02/27/2016  . Osteoarthritis of hip (  Bilateral) (L>R) 02/27/2016  . Chronic neck pain (posterior midline) (Bilateral) (L>R) 02/27/2016  . Cervicogenic headache (Bilateral) (L>R) 02/27/2016  . Occipital headache (Bilateral) (L>R) 02/27/2016  . Chronic upper extremity pain (Bilateral) (L>R) 02/27/2016  . Chronic Cervical radicular pain (Bilateral) (L>R) 02/27/2016  . Chronic lumbar radicular pain (Right) (L5 dermatome) 02/27/2016  . Lumbar facet syndrome (Bilateral) (L>R) 02/27/2016  . Long term  prescription benzodiazepine use 02/27/2016  . Hypertension   . COPD (chronic obstructive pulmonary disease) ([place]) 10/09/2015  . GERD (gastroesophageal reflux disease) 10/09/2015  . Non-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/2014 08/02/2015  . Hyperlipidemia 08/02/2015  . Costochondritis 08/02/2015  . 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 GAD (generalized anxiety disorder) 09/03/2014  . Narcotic dependence ([place]) 09/03/2014  . PVD (peripheral vascular disease) ([place]) 09/03/2014  . Smoker 09/03/2014  . Cigarette nicotine dependence with nicotine-induced disorder 07/08/2014  . Anemia of chronic disease 03/19/2014  . Narcotic abuse, continuous ([place]) 03/19/2014  . Polysubstance abuse ([place]) 03/19/2014  . MSSA (methicillin susceptible Staphylococcus aureus) septicemia ([place]) 03/07/2014  . Hypomagnesemia 01/04/2014  . Chronic cough 09/04/2011  . Sleep apnea, obstructive 09/04/2011    Past Surgical History:  Procedure Laterality Date  . ANTERIOR CERVICAL DECOMP/DISCECTOMY FUSION N/A 03/30/2018   Procedure: Cervical six-seven Anterior cervical decompression/discectomy/fusion;  Surgeon: Judith Part, MD;  Location: [facility];  Service: Neurosurgery;  Laterality: N/A;  . BACK SURGERY    . CARDIAC CATHETERIZATION Left 02/22/2016   Procedure: Left Heart Cath and Coronary Angiography;  Surgeon: Yolonda Kida, MD;  Location: [facility];  Service: Cardiovascular;  Laterality: Left;  . COLONOSCOPY WITH PROPOFOL N/A 09/19/2016   Procedure: COLONOSCOPY WITH PROPOFOL;  Surgeon: [name], MD;  Location: [facility] ENDOSCOPY;  Service: Endoscopy;  Laterality: N/A;  . ESOPHAGOGASTRODUODENOSCOPY (EGD) WITH PROPOFOL N/A 09/18/2017   Procedure: ESOPHAGOGASTRODUODENOSCOPY (EGD) WITH PROPOFOL;  Surgeon: [name], MD;  Location[facility] ENDOSCOPY;  Service: Gastroenterology;  Laterality: N/A;  . hemorroids    . NASAL SINUS SURGERY    . ORIF FEMUR FRACTURE    . ORIF TIBIA & FIBULA FRACTURES    . OVARY SURGERY       Prior to Admission medications   Medication Sig Start Date End Date Taking? Authorizing Provider  albuterol (PROVENTIL HFA;VENTOLIN HFA) 108 (90 Base) MCG/ACT inhaler Inhale 2 puffs into the lungs every 6 (six) hours as needed for wheezing or shortness of breath. 05/26/17   [name], [name]  albuterol (VENTOLIN HFA) 108 (90 Base) MCG/ACT inhaler Inhale 2 puffs into the lungs every 4 (four) hours as needed for wheezing or shortness of breath. 12/24/18   Duffy Bruce, MD  atorvastatin (LIPITOR) 20 MG tablet Take 1 tablet (20 mg total) by mouth daily. 07/09/18   [name], [name]  budesonide-formoterol (SYMBICORT) 160-4.5 MCG/ACT inhaler Inhale 2 puffs into the lungs 2 (two) times daily. 05/26/17   [name], [name]  cephALEXin (KEFLEX) 500 MG capsule Take 1 capsule (500 mg total) by mouth every 8 (eight) hours. 10/22/18   Hillary Bow, MD  docusate sodium (COLACE) 100 MG capsule Take 200 mg by mouth 2 (two) times daily as needed for mild constipation.    [provider]  DULoxetine (CYMBALTA) 60 MG capsule Take 1 capsule (60 mg total) by mouth daily. 02/12/18   [name], MD  gabapentin (NEURONTIN) 600 MG tablet Take 1 tablet (600 mg total) by mouth 3 (three) times daily. 06/26/17 10/19/18  [name], [name]  hydrochlorothiazide (HYDRODIURIL)  25 MG tablet Take 1 tablet (25 mg total) by mouth daily. 06/23/18   [name], [name]  HYDROcodone-acetaminophen (NORCO/VICODIN) 5-325 MG tablet Take 1 tablet by mouth every 6 (six) hours as needed for moderate pain. 12/01/18   Fisher, Linden Dolin, [name]  ipratropium-albuterol (DUONEB) 0.5-2.5 (3) MG/3ML SOLN Take 3 mLs by nebulization every 6 (six) hours as needed. 07/17/18   Laverle Hobby, MD  lisinopril (PRINIVIL,ZESTRIL) 40 MG tablet Take 1 tablet (40 mg total) by mouth daily. 08/05/17   [name], [name]  metoprolol tartrate (LOPRESSOR) 25 MG tablet TAKE 1 TABLET BY MOUTH TWICE A DAY 11/23/18   Carles Collet M, [name]   Naloxone HCl (NARCAN IJ) Inject as directed as needed.    [provider]  nitroGLYCERIN (NITROSTAT) 0.4 MG SL tablet Place 1 tablet (0.4 mg total) under the tongue every 5 (five) minutes x 3 doses as needed for chest pain. 08/03/15   Demetrios Loll, MD  omeprazole (PRILOSEC) 40 MG capsule Take 1 capsule (40 mg total) by mouth daily. 05/19/18   [name], MD  ondansetron (ZOFRAN-ODT) 8 MG disintegrating tablet Take 1 tablet (8 mg total) by mouth 2 (two) times daily. 30-day rx. Please call [phone] to schedule appt for continued refills. 12/23/18   [name], [name]  Oxycodone HCl 10 MG TABS Take 10 mg by mouth 4 (four) times daily.  06/19/18   [provider]  oxyCODONE-acetaminophen (PERCOCET) 10-325 MG tablet Take 1 tablet by mouth every 6 (six) hours. 10/15/18   [provider]  predniSONE (DELTASONE) 20 MG tablet Take 2 tablets (40 mg total) by mouth daily for 5 days. 12/24/18 12/29/18  Duffy Bruce, MD  rizatriptan (MAXALT-MLT) 10 MG disintegrating tablet Take 1 tablet (10 mg total) by mouth as needed. May repeat in 2 hours if needed 02/12/18   [name], MD  [facility] RESPIMAT 2.5 MCG/ACT AERS INHALE 1 PUFF BY MOUTH EVERY DAY 12/14/18   [name], [name]  sucralfate (CARAFATE) 1 GM/10ML suspension Take 10 mLs (1 g total) by mouth 4 (four) times daily. 05/19/18   [name], MD  tiZANidine (ZANAFLEX) 4 MG tablet Take 4 mg by mouth daily.     [provider]    Allergies Flexeril [cyclobenzaprine], Levofloxacin, Ketorolac, Phenergan [promethazine hcl], Toradol [ketorolac tromethamine], Tramadol, and Zoloft [sertraline hcl]  Family History  Problem Relation Age of Onset  . Heart disease Mother   . Lung cancer Mother   . Ovarian cancer Mother   . Healthy Brother   . Healthy Brother   . Healthy Brother   . Diabetes Maternal Uncle   . Breast cancer Maternal Aunt     Social History Social History   Tobacco Use  . Smoking status: Current Every Day  Smoker    Packs/day: 0.50    Types: Cigarettes  . Smokeless tobacco: Never Used  Substance Use Topics  . Alcohol use: Yes    Alcohol/week: 0.0 standard drinks  . Drug use: Yes    Types: Marijuana    Comment: 2 months ago     Review of Systems  Review of Systems  Constitutional: Positive for fatigue. Negative for fever.  HENT: Negative for congestion and sore throat.   Eyes: Negative for visual disturbance.  Respiratory: Positive for cough. Negative for shortness of breath.   Cardiovascular: Negative for chest pain.  Gastrointestinal: Negative for abdominal pain, diarrhea, nausea and vomiting.  Genitourinary: Negative for flank pain.  Musculoskeletal: Negative for back pain and  neck pain.  Skin: Negative for rash and wound.  Neurological: Positive for syncope and weakness.  All other systems reviewed and are negative.    ____________________________________________  PHYSICAL EXAM:      VITAL SIGNS: ED Triage Vitals  Enc Vitals Group     BP 12/24/18 0351 112/64     Pulse Rate 12/24/18 0351 (!) 113     Resp 12/24/18 0351 19     Temp 12/24/18 0352 98.7 F (37.1 C)     Temp Source 12/24/18 0352 Oral     SpO2 12/24/18 0351 97 %     Weight 12/24/18 0353 185 lb (83.9 kg)     Height 12/24/18 0353 5\' 4"  (1.626 m)     Head Circumference --      Peak Flow --      Pain Score 12/24/18 0352 10     Pain Loc --      Pain Edu? --      Excl. in [place]? --      Physical Exam Vitals signs and nursing note reviewed.  Constitutional:      General: She is not in acute distress.    Appearance: She is well-developed.  HENT:     Head: Normocephalic and atraumatic.     Comments: Mildly dry mucous membranes Eyes:     Conjunctiva/sclera: Conjunctivae normal.  Neck:     Musculoskeletal: Neck supple.  Cardiovascular:     Rate and Rhythm: Normal rate and regular rhythm.     Heart sounds: Normal heart sounds. No murmur. No friction rub.  Pulmonary:     Effort: Pulmonary effort is  normal. No respiratory distress.     Breath sounds: Wheezing present. No rales.     Comments: Scant, scattered, and expiratory wheezes Abdominal:     General: There is no distension.     Palpations: Abdomen is soft.     Tenderness: There is no abdominal tenderness.  Skin:    General: Skin is warm.     Capillary Refill: Capillary refill takes less than 2 seconds.  Neurological:     Mental Status: She is alert and oriented to person, place, and time.     Motor: No abnormal muscle tone.       ____________________________________________   LABS (all labs ordered are listed, but only abnormal results are displayed)  Labs Reviewed  CBC WITH DIFFERENTIAL/PLATELET - Abnormal; Notable for the following components:      Result Value   RDW 16.2 (*)    All other components within normal limits  BLOOD GAS, VENOUS - Abnormal; Notable for the following components:   pO2, Ven <31.0 (*)    All other components within normal limits  URINALYSIS, COMPLETE (UACMP) WITH MICROSCOPIC - Abnormal; Notable for the following components:   Color, Urine COLORLESS (*)    APPearance CLEAR (*)    Specific Gravity, Urine 1.003 (*)    Leukocytes,Ua TRACE (*)    All other components within normal limits  BASIC METABOLIC PANEL  POC URINE PREG, ED    ____________________________________________  EKG: Normal sinus rhythm, ventricular rate 81.  PR 188 QRS 79 QTc 426.  No acute ischemic changes.  No significant change from prior. ________________________________________  RADIOLOGY All imaging, including plain films, CT scans, and ultrasounds, independently reviewed by me, and interpretations confirmed via formal radiology reads.  ED MD interpretation:   CXR: Negative  Official radiology report(s): Dg Chest 2 View  Result Date: 12/24/2018 CLINICAL DATA:  Short of breath with  weakness and syncope EXAM: CHEST - 2 VIEW COMPARISON:  10/21/2018 FINDINGS: Lungs are now clear. Interval clearing of bilateral  airspace disease seen previously. Negative for heart failure or pneumonia.  No pleural effusion. IMPRESSION: No active cardiopulmonary disease. Electronically Signed   By: Franchot Gallo M.D.   On: 12/24/2018 08:32    ____________________________________________  PROCEDURES   Procedure(s) performed (including Critical Care):  Procedures  ____________________________________________  INITIAL IMPRESSION / MDM / [place] / ED COURSE  As part of my medical decision making, I reviewed the following data within the electronic MEDICAL RECORD NUMBER Notes from prior ED visits and [place] Controlled Substance Database      *Sara Munoz was evaluated in Emergency Department on 12/24/2018 for the symptoms described in the history of present illness. She was evaluated in the context of the global COVID-19 pandemic, which necessitated consideration that the patient might be at risk for infection with the SARS-CoV-2 virus that causes COVID-19. Institutional protocols and algorithms that pertain to the evaluation of patients at risk for COVID-19 are in a state of rapid change based on information released by regulatory bodies including the CDC and federal and state organizations. These policies and algorithms were followed during the patient's care in the ED.  Some ED evaluations and interventions may be delayed as a result of limited staffing during the pandemic.*   Clinical Course as of Dec 23 840  Thu Dec 24, 2018  0736 53 yo F with PMHx as above here with multiple complaints. Initially reported chronic pain, but on my exam c/o syncope. Per review of records, this is a recurrent issue. Denies any CP, palpitations. Will check screening labs to eval anemia, metabolic abnormality, will also check CXR/VBG given mild drowsiness on exam though I suspect much of this is 2/2 her chronic polysubstance abuse. No focal neuro deficits. No high risk syncope features and this seems correlated with both pain  (vasovagal) and standing (orthostatic).   [CI]  [id number] Lab work reviewed and is overall very reassuring.  No anemia.  Electrolytes acceptable.  Blood gas is likely at baseline, with low PO2 and slight increase in PCO2 likely due to venous sample, as well as patient resting asleep.  She is completely hemodynamically stable.  She had no ectopy on telemetry.  This is a known, chronic, recurrent issue and while it could be secondary to pain with vasovagal syncope, I am hesitant to give any additional analgesics in the setting of her chronic polysubstance use.  No apparent acute infection.  No arrhythmias mentioned.  No ectopy or ischemia on EKG.  Will discharge with encouraged fluids, a brief course of prednisone which will help with both her neck radiculopathy as well as her underlying COPD.   [CI]    Clinical Course User Index [CI] Duffy Bruce, MD    Medical Decision Making: As above  ____________________________________________  FINAL CLINICAL IMPRESSION(S) / ED DIAGNOSES  Final diagnoses:  Dehydration  Vasovagal syncope  Chronic neck pain     MEDICATIONS GIVEN DURING THIS VISIT:  Medications  methylPREDNISolone sodium succinate (SOLU-MEDROL) 125 mg/2 mL injection 125 mg (has no administration in time range)  oxyCODONE-acetaminophen (PERCOCET/ROXICET) 5-325 MG per tablet 1 tablet (has no administration in time range)  sodium chloride 0.9 % bolus 1,000 mL (1,000 mLs Intravenous New Bag/Given 12/24/18 0751)  ipratropium-albuterol (DUONEB) 0.5-2.5 (3) MG/3ML nebulizer solution 3 mL (3 mLs Nebulization Given 12/24/18 0753)     ED Discharge Orders  Ordered    predniSONE (DELTASONE) 20 MG tablet  Daily     12/24/18 0840    albuterol (VENTOLIN HFA) 108 (90 Base) MCG/ACT inhaler  Every 4 hours PRN     12/24/18 0840           Note:  This document was prepared using Dragon voice recognition software and may include unintentional dictation errors.   Duffy Bruce, MD  12/24/18 [phone]

## 2018-12-24 NOTE — ED Triage Notes (Signed)
Pt arrived to ED waiting room via EMS with c/o increased chronic pain.  Pt reports having neck and  back pain. Pt has taken her prescribed pain medications with no relief. Pt states she recently broke her tailbone and it has been bothering her.

## 2018-12-24 NOTE — ED Notes (Signed)
Patient attempting to get in touch with ride.

## 2018-12-24 NOTE — ED Notes (Signed)
Patient ambulatory to lobby with steady gait and NAD noted. Verbalized understanding of discharge instructions and follow-up care.  

## 2018-12-24 NOTE — Discharge Instructions (Addendum)
Make sure you continue to drink at least 6 to 8 glasses of water per day.    Try to eat a balanced diet.   Continue taking your home pain medications, and I will prescribe steroids which will help with both your breathing as well as your neck pain.

## 2018-12-28 ENCOUNTER — Ambulatory Visit: Payer: Medicaid Other | Admitting: Internal Medicine

## 2018-12-31 DIAGNOSIS — Z6831 Body mass index (BMI) 31.0-31.9, adult: Secondary | ICD-10-CM | POA: Diagnosis not present

## 2018-12-31 DIAGNOSIS — I10 Essential (primary) hypertension: Secondary | ICD-10-CM | POA: Diagnosis not present

## 2018-12-31 DIAGNOSIS — M5412 Radiculopathy, cervical region: Secondary | ICD-10-CM | POA: Diagnosis not present

## 2018-12-31 DIAGNOSIS — G959 Disease of spinal cord, unspecified: Secondary | ICD-10-CM | POA: Diagnosis not present

## 2019-01-01 ENCOUNTER — Other Ambulatory Visit: Payer: Self-pay | Admitting: Neurological Surgery

## 2019-01-01 DIAGNOSIS — H40021 Open angle with borderline findings, high risk, right eye: Secondary | ICD-10-CM | POA: Diagnosis not present

## 2019-01-01 DIAGNOSIS — H5203 Hypermetropia, bilateral: Secondary | ICD-10-CM | POA: Diagnosis not present

## 2019-01-01 DIAGNOSIS — H25043 Posterior subcapsular polar age-related cataract, bilateral: Secondary | ICD-10-CM | POA: Diagnosis not present

## 2019-01-06 ENCOUNTER — Other Ambulatory Visit: Payer: Self-pay | Admitting: Neurology

## 2019-01-06 ENCOUNTER — Other Ambulatory Visit: Payer: Self-pay | Admitting: Neurological Surgery

## 2019-01-06 DIAGNOSIS — M79622 Pain in left upper arm: Secondary | ICD-10-CM | POA: Diagnosis not present

## 2019-01-06 DIAGNOSIS — M79621 Pain in right upper arm: Secondary | ICD-10-CM | POA: Diagnosis not present

## 2019-01-06 DIAGNOSIS — G959 Disease of spinal cord, unspecified: Secondary | ICD-10-CM

## 2019-01-06 DIAGNOSIS — M79662 Pain in left lower leg: Secondary | ICD-10-CM | POA: Diagnosis not present

## 2019-01-06 DIAGNOSIS — H5213 Myopia, bilateral: Secondary | ICD-10-CM | POA: Diagnosis not present

## 2019-01-06 DIAGNOSIS — M79661 Pain in right lower leg: Secondary | ICD-10-CM | POA: Diagnosis not present

## 2019-01-06 NOTE — Pre-Procedure Instructions (Signed)
Sara Munoz  01/06/2019      [id number] - [place], [place] - [address] [place] [address] Phone: [phone] Fax: [phone]    Your procedure is scheduled on 01/11/19.  Report to [facility] Admitting at 6 A.M.  Call this number if you have problems the morning of surgery:  [phone]   Remember:  Do not eat or drink after midnight.  Y    Take these medicines the morning of surgery with A SIP OF WATER ---ALL INHALERS,CYMBALTA,NEURONTIN,LOPRESSOR,PRILOSEC,OXYCODONE    Do not wear jewelry, make-up or nail polish.  Do not wear lotions, powders, or perfumes, or deodorant.  Do not shave 48 hours prior to surgery.  Men may shave face and neck.  Do not bring valuables to the hospital.  [facility] is not responsible for any belongings or valuables.  Contacts, dentures or bridgework may not be worn into surgery.  Leave your suitcase in the car.  After surgery it may be brought to your room.  For patients admitted to the hospital, discharge time will be determined by your treatment team.  Patients discharged the day of surgery will not be allowed to drive home.    Special instructions:  Do not take any aspirin,anti-inflammatories,vitamins,or herbal supplements 5-7 days prior to surgery.[facility] - Preparing for Surgery  Before surgery, you can play an important role.  Because skin is not sterile, your skin needs to be as free of germs as possible.  You can reduce the number of germs on you skin by washing with CHG (chlorahexidine gluconate) soap before surgery.  CHG is an antiseptic cleaner which kills germs and bonds with the skin to continue killing germs even after washing.  Oral Hygiene is also important in reducing the risk of infection.  Remember to brush your teeth with your regular toothpaste the morning of surgery.  Please DO NOT use if you have an allergy to CHG or antibacterial soaps.  If your skin becomes reddened/irritated stop  using the CHG and inform your nurse when you arrive at Short Stay.  Do not shave (including legs and underarms) for at least 48 hours prior to the first CHG shower.  You may shave your face.  Please follow these instructions carefully:   1.  Shower with CHG Soap the night before surgery and the morning of Surgery.  2.  If you choose to wash your hair, wash your hair first as usual with your normal shampoo.  3.  After you shampoo, rinse your hair and body thoroughly to remove the shampoo. 4.  Use CHG as you would any other liquid soap.  You can apply chg directly to the skin and wash gently with a      scrungie or washcloth.           5.  Apply the CHG Soap to your body ONLY FROM THE NECK DOWN.   Do not use on open wounds or open sores. Avoid contact with your eyes, ears, mouth and genitals (private parts).  Wash genitals (private parts) with your normal soap.  6.  Wash thoroughly, paying special attention to the area where your surgery will be performed.  7.  Thoroughly rinse your body with warm water from the neck down.  8.  DO NOT shower/wash with your normal soap after using and rinsing off the CHG Soap.  9.  Pat yourself dry with a clean towel.  10.  Wear clean pajamas.            11.  Place clean sheets on your bed the night of your first shower and do not sleep with pets.  Day of Surgery  Do not apply any lotions/deoderants the morning of surgery.   Please wear clean clothes to the hospital/surgery center. Remember to brush your teeth with toothpaste.    Please read over the following fact sheets that you were given. MRSA Information

## 2019-01-07 ENCOUNTER — Other Ambulatory Visit: Payer: Self-pay

## 2019-01-07 ENCOUNTER — Other Ambulatory Visit
Admission: RE | Admit: 2019-01-07 | Discharge: 2019-01-07 | Disposition: A | Discharge status: Home / Self Care | Payer: Medicaid Other | Source: Ambulatory Visit | Attending: Neurological Surgery | Admitting: Neurological Surgery

## 2019-01-07 ENCOUNTER — Encounter (HOSPITAL_COMMUNITY)
Admission: RE | Admit: 2019-01-07 | Discharge: 2019-01-07 | Disposition: A | Discharge status: Home / Self Care | Payer: Medicaid Other | Source: Ambulatory Visit | Attending: Neurological Surgery | Admitting: Neurological Surgery

## 2019-01-07 ENCOUNTER — Encounter (HOSPITAL_COMMUNITY): Payer: Self-pay

## 2019-01-07 DIAGNOSIS — Z1159 Encounter for screening for other viral diseases: Secondary | ICD-10-CM | POA: Insufficient documentation

## 2019-01-07 DIAGNOSIS — Z01812 Encounter for preprocedural laboratory examination: Secondary | ICD-10-CM | POA: Diagnosis not present

## 2019-01-07 LAB — BASIC METABOLIC PANEL
Anion gap: 10 (ref 5–15)
BUN: 11 mg/dL (ref 6–20)
CO2: 23 mmol/L (ref 22–32)
Calcium: 9.6 mg/dL (ref 8.9–10.3)
Chloride: 102 mmol/L (ref 98–111)
Creatinine, Ser: 0.77 mg/dL (ref 0.44–1.00)
GFR calc Af Amer: >60 mL/min (ref >60)
GFR calc non Af Amer: >60 mL/min (ref >60)
Glucose, Bld: 92 mg/dL (ref 70–99)
Potassium: 4.3 mmol/L (ref 3.5–5.1)
Sodium: 135 mmol/L (ref 135–145)

## 2019-01-07 LAB — CBC
HCT: 41.9 % (ref 36.0–46.0)
Hemoglobin: 13.4 g/dL (ref 12.0–15.0)
MCH: 29.3 pg (ref 26.0–34.0)
MCHC: 32 g/dL (ref 30.0–36.0)
MCV: 91.5 fL (ref 80.0–100.0)
Platelets: 251 10*3/uL (ref 150–400)
RBC: 4.58 MIL/uL (ref 3.87–5.11)
RDW: 15.9 % — ABNORMAL HIGH (ref 11.5–15.5)
WBC: 11.2 10*3/uL — ABNORMAL HIGH (ref 4.0–10.5)
nRBC: 0 % (ref 0.0–0.2)

## 2019-01-07 LAB — SURGICAL PCR SCREEN
MRSA, PCR: NEGATIVE
Staphylococcus aureus: POSITIVE — AB

## 2019-01-07 LAB — TYPE AND SCREEN
ABO/RH(D): O POS
Antibody Screen: NEGATIVE

## 2019-01-07 MED ORDER — CHLORHEXIDINE GLUCONATE CLOTH 2 % EX PADS: 6.0000 | MEDICATED_PAD | Freq: Once | CUTANEOUS | Status: DC

## 2019-01-08 LAB — SARS CORONAVIRUS 2 (TAT 6-24 HRS): SARS Coronavirus 2: NEGATIVE

## 2019-01-08 NOTE — Anesthesia Preprocedure Evaluation (Addendum)
Anesthesia Evaluation  Patient identified by MRN, date of birth, ID band Patient awake    Reviewed: Allergy & Precautions, NPO status , Patient's Chart, lab work & pertinent test results  Airway Mallampati: II  TM Distance: >3 FB     Dental   Pulmonary COPD, Current Smoker,    breath sounds clear to auscultation       Cardiovascular hypertension, + Past MI and + Peripheral Vascular Disease   Rhythm:Regular Rate:Normal     Neuro/Psych    GI/Hepatic GERD  ,(+) Hepatitis -  Endo/Other    Renal/GU negative Renal ROS     Musculoskeletal   Abdominal   Peds  Hematology   Anesthesia Other Findings   Reproductive/Obstetrics                           Anesthesia Physical Anesthesia Plan  ASA: III  Anesthesia Plan: General   Post-op Pain Management:    Induction: Intravenous  PONV Risk Score and Plan: Ondansetron, Dexamethasone, Midazolam and Treatment may vary due to age or medical condition  Airway Management Planned: Oral ETT  Additional Equipment:   Intra-op Plan:   Post-operative Plan: Possible Post-op intubation/ventilation  Informed Consent: I have reviewed the patients History and Physical, chart, labs and discussed the procedure including the risks, benefits and alternatives for the proposed anesthesia with the patient or authorized representative who has indicated his/her understanding and acceptance.     Dental advisory given  Plan Discussed with: Anesthesiologist and CRNA  Anesthesia Plan Comments: (Long history of atypical chest pain. Cardiac records reviewed from [place], [place], and [place]. MI documented in [place] record, but there is no MI documented in her primary cardiologist records from [place].  She has undergone many cardiac workups, all with essentially benign findings. Her most recent cath in 2017 showed Left dominant system, normal left ventricular function, no  evidence of significant obstructive coronary disease. Nuclear stress 2019 was low risk, no ischemia. Echo 10/19/18 showed EF 55-60%, no significant valvular abnormalities.   TTE 10/19/18:  1. The left ventricle has normal systolic function, with an ejection fraction of 55-60%. The cavity size was normal. Left ventricular diastolic parameters were normal.  2. The right ventricle has normal systolic function. The cavity was normal. There is no increase in right ventricular wall thickness.  3. The mitral valve is grossly normal. Moderate thickening of the mitral valve leaflet. Mild calcification of the mitral valve leaflet. Mild mitral valve stenosis.  4. The tricuspid valve is grossly normal.  5. The aortic valve is grossly normal. Mild calcification of the aortic valve. Aortic valve regurgitation was not assessed by color flow Doppler.  6. Suboptimal study in spite of using echo contrast.  Nuclear stress 07/10/17:  The study is normal.  This is a low risk study.  The left ventricular ejection fraction is normal (55-65%).  Blood pressure demonstrated a normal response to exercise.  There was no ST segment deviation noted during stress.  Cath 02/22/16:  LM lesion, 25 %stenosed.  Prox Cx lesion, 25 %stenosed.  The left ventricular systolic function is normal.  LV end diastolic pressure is normal.  Left dominant system  Mild coronary disease in the proximal ostial circumflex as well as mid left main 25% nonobstructive   Conclusion Left dominant system normal left ventricular function no evidence of significant obstructive coronary disease)      Anesthesia Quick Evaluation

## 2019-01-11 ENCOUNTER — Encounter (HOSPITAL_COMMUNITY): Payer: Self-pay

## 2019-01-11 ENCOUNTER — Ambulatory Visit (HOSPITAL_COMMUNITY): Payer: Medicaid Other | Admitting: Certified Registered Nurse Anesthetist

## 2019-01-11 ENCOUNTER — Ambulatory Visit (HOSPITAL_COMMUNITY): Payer: Medicaid Other

## 2019-01-11 ENCOUNTER — Encounter (HOSPITAL_COMMUNITY)
Admission: RE | Disposition: A | Discharge status: Home / Self Care | Payer: Self-pay | Source: Home / Self Care | Attending: Neurological Surgery

## 2019-01-11 ENCOUNTER — Ambulatory Visit (HOSPITAL_COMMUNITY): Payer: Medicaid Other | Admitting: Physician Assistant

## 2019-01-11 ENCOUNTER — Other Ambulatory Visit: Payer: Self-pay

## 2019-01-11 ENCOUNTER — Observation Stay (HOSPITAL_COMMUNITY)
Admission: RE | Admit: 2019-01-11 | Discharge: 2019-01-12 | Disposition: A | Discharge status: Home / Self Care | Payer: Medicaid Other | Attending: Neurological Surgery | Admitting: Neurological Surgery

## 2019-01-11 DIAGNOSIS — Z8673 Personal history of transient ischemic attack (TIA), and cerebral infarction without residual deficits: Secondary | ICD-10-CM | POA: Diagnosis not present

## 2019-01-11 DIAGNOSIS — E7800 Pure hypercholesterolemia, unspecified: Secondary | ICD-10-CM | POA: Diagnosis not present

## 2019-01-11 DIAGNOSIS — I252 Old myocardial infarction: Secondary | ICD-10-CM | POA: Diagnosis not present

## 2019-01-11 DIAGNOSIS — M50122 Cervical disc disorder at C5-C6 level with radiculopathy: Principal | ICD-10-CM | POA: Insufficient documentation

## 2019-01-11 DIAGNOSIS — K219 Gastro-esophageal reflux disease without esophagitis: Secondary | ICD-10-CM | POA: Insufficient documentation

## 2019-01-11 DIAGNOSIS — F17210 Nicotine dependence, cigarettes, uncomplicated: Secondary | ICD-10-CM | POA: Insufficient documentation

## 2019-01-11 DIAGNOSIS — I10 Essential (primary) hypertension: Secondary | ICD-10-CM | POA: Diagnosis not present

## 2019-01-11 DIAGNOSIS — Z79899 Other long term (current) drug therapy: Secondary | ICD-10-CM | POA: Insufficient documentation

## 2019-01-11 DIAGNOSIS — F419 Anxiety disorder, unspecified: Secondary | ICD-10-CM | POA: Diagnosis not present

## 2019-01-11 DIAGNOSIS — Z981 Arthrodesis status: Secondary | ICD-10-CM | POA: Diagnosis not present

## 2019-01-11 DIAGNOSIS — Z419 Encounter for procedure for purposes other than remedying health state, unspecified: Secondary | ICD-10-CM

## 2019-01-11 DIAGNOSIS — J449 Chronic obstructive pulmonary disease, unspecified: Secondary | ICD-10-CM | POA: Insufficient documentation

## 2019-01-11 DIAGNOSIS — M5412 Radiculopathy, cervical region: Secondary | ICD-10-CM | POA: Diagnosis not present

## 2019-01-11 DIAGNOSIS — M4802 Spinal stenosis, cervical region: Secondary | ICD-10-CM | POA: Diagnosis not present

## 2019-01-11 DIAGNOSIS — I739 Peripheral vascular disease, unspecified: Secondary | ICD-10-CM | POA: Diagnosis not present

## 2019-01-11 DIAGNOSIS — E785 Hyperlipidemia, unspecified: Secondary | ICD-10-CM | POA: Diagnosis not present

## 2019-01-11 DIAGNOSIS — I2510 Atherosclerotic heart disease of native coronary artery without angina pectoris: Secondary | ICD-10-CM | POA: Diagnosis not present

## 2019-01-11 HISTORY — PX: ANTERIOR CERVICAL DECOMP/DISCECTOMY FUSION: SHX1161

## 2019-01-11 SURGERY — ANTERIOR CERVICAL DECOMPRESSION/DISCECTOMY FUSION 2 LEVEL/HARDWARE REMOVAL
Anesthesia: General | Site: Spine Cervical

## 2019-01-11 MED ORDER — LIDOCAINE-EPINEPHRINE 1 %-1:100000 IJ SOLN
INTRAMUSCULAR | Status: DC | PRN
  Administered 2019-01-11: 10 mL

## 2019-01-11 MED ORDER — LIDOCAINE-EPINEPHRINE 1 %-1:100000 IJ SOLN
INTRAMUSCULAR | Status: AC
  Filled 2019-01-11: qty 1

## 2019-01-11 MED ORDER — THROMBIN 5000 UNITS EX SOLR
CUTANEOUS | Status: AC
  Filled 2019-01-11: qty 5000

## 2019-01-11 MED ORDER — LIDOCAINE 2% (20 MG/ML) 5 ML SYRINGE
INTRAMUSCULAR | Status: AC
  Filled 2019-01-11: qty 5

## 2019-01-11 MED ORDER — PHENYLEPHRINE HCL (PRESSORS) 10 MG/ML IV SOLN
INTRAVENOUS | Status: DC | PRN
  Administered 2019-01-11: 80 ug via INTRAVENOUS

## 2019-01-11 MED ORDER — RIZATRIPTAN BENZOATE 10 MG PO TBDP: 10.0000 mg | ORAL_TABLET | ORAL | Status: DC | PRN

## 2019-01-11 MED ORDER — LISINOPRIL 20 MG PO TABS
40.0000 mg | ORAL_TABLET | Freq: Every day | ORAL | Status: DC
  Administered 2019-01-11 – 2019-01-12 (×2): 40 mg via ORAL
  Filled 2019-01-11 (×2): qty 2

## 2019-01-11 MED ORDER — SODIUM CHLORIDE 0.9 % IV SOLN
INTRAVENOUS | Status: DC | PRN
  Administered 2019-01-11: 08:00:00 50 ug/min via INTRAVENOUS

## 2019-01-11 MED ORDER — OXYCODONE HCL 5 MG PO TABS
10.0000 mg | ORAL_TABLET | ORAL | Status: DC | PRN
  Administered 2019-01-11 (×3): 10 mg via ORAL
  Filled 2019-01-11 (×3): qty 2

## 2019-01-11 MED ORDER — ACETAMINOPHEN 325 MG PO TABS: 650.0000 mg | ORAL_TABLET | ORAL | Status: DC | PRN

## 2019-01-11 MED ORDER — PROPOFOL 10 MG/ML IV BOLUS
INTRAVENOUS | Status: DC | PRN
  Administered 2019-01-11: 180 mg via INTRAVENOUS

## 2019-01-11 MED ORDER — GABAPENTIN 600 MG PO TABS
600.0000 mg | ORAL_TABLET | Freq: Three times a day (TID) | ORAL | Status: DC
  Administered 2019-01-11 – 2019-01-12 (×3): 600 mg via ORAL
  Filled 2019-01-11 (×3): qty 1

## 2019-01-11 MED ORDER — ROCURONIUM BROMIDE 10 MG/ML (PF) SYRINGE
PREFILLED_SYRINGE | INTRAVENOUS | Status: AC
  Filled 2019-01-11: qty 10

## 2019-01-11 MED ORDER — ONDANSETRON HCL 4 MG PO TABS: 4.0000 mg | ORAL_TABLET | Freq: Four times a day (QID) | ORAL | Status: DC | PRN

## 2019-01-11 MED ORDER — SODIUM CHLORIDE 0.9 % IV SOLN: 250.0000 mL | INTRAVENOUS | Status: DC

## 2019-01-11 MED ORDER — PHENYLEPHRINE 40 MCG/ML (10ML) SYRINGE FOR IV PUSH (FOR BLOOD PRESSURE SUPPORT)
PREFILLED_SYRINGE | INTRAVENOUS | Status: AC
  Filled 2019-01-11: qty 10

## 2019-01-11 MED ORDER — THROMBIN 5000 UNITS EX SOLR
OROMUCOSAL | Status: DC | PRN
  Administered 2019-01-11: 09:00:00 5 mL

## 2019-01-11 MED ORDER — LACTATED RINGERS IV SOLN
INTRAVENOUS | Status: DC
  Administered 2019-01-11 (×3): via INTRAVENOUS

## 2019-01-11 MED ORDER — OXYCODONE HCL 5 MG PO TABS: 5.0000 mg | ORAL_TABLET | ORAL | Status: DC | PRN

## 2019-01-11 MED ORDER — ALBUTEROL SULFATE HFA 108 (90 BASE) MCG/ACT IN AERS: 2.0000 | INHALATION_SPRAY | Freq: Four times a day (QID) | RESPIRATORY_TRACT | Status: DC | PRN

## 2019-01-11 MED ORDER — SODIUM CHLORIDE 0.9% FLUSH
3.0000 mL | Freq: Two times a day (BID) | INTRAVENOUS | Status: DC
  Administered 2019-01-11: 3 mL via INTRAVENOUS

## 2019-01-11 MED ORDER — MENTHOL 3 MG MT LOZG: 1.0000 | LOZENGE | OROMUCOSAL | Status: DC | PRN

## 2019-01-11 MED ORDER — ROCURONIUM BROMIDE 50 MG/5ML IV SOSY
PREFILLED_SYRINGE | INTRAVENOUS | Status: DC | PRN
  Administered 2019-01-11: 50 mg via INTRAVENOUS
  Administered 2019-01-11 (×2): 20 mg via INTRAVENOUS
  Administered 2019-01-11: 10 mg via INTRAVENOUS

## 2019-01-11 MED ORDER — MIDAZOLAM HCL 5 MG/5ML IJ SOLN
INTRAMUSCULAR | Status: DC | PRN
  Administered 2019-01-11: 2 mg via INTRAVENOUS

## 2019-01-11 MED ORDER — TIZANIDINE HCL 4 MG PO TABS
4.0000 mg | ORAL_TABLET | Freq: Three times a day (TID) | ORAL | Status: DC
  Administered 2019-01-11 – 2019-01-12 (×3): 4 mg via ORAL
  Filled 2019-01-11 (×3): qty 1

## 2019-01-11 MED ORDER — PHENOL 1.4 % MT LIQD
1.0000 | OROMUCOSAL | Status: DC | PRN
  Filled 2019-01-11: qty 177

## 2019-01-11 MED ORDER — SODIUM CHLORIDE 0.9 % IV SOLN
INTRAVENOUS | Status: DC | PRN
  Administered 2019-01-11: 500 mL

## 2019-01-11 MED ORDER — MIDAZOLAM HCL 2 MG/2ML IJ SOLN
INTRAMUSCULAR | Status: AC
  Filled 2019-01-11: qty 2

## 2019-01-11 MED ORDER — SUGAMMADEX SODIUM 200 MG/2ML IV SOLN
INTRAVENOUS | Status: DC | PRN
  Administered 2019-01-11: 200 mg via INTRAVENOUS

## 2019-01-11 MED ORDER — METOPROLOL TARTRATE 5 MG/5ML IV SOLN
INTRAVENOUS | Status: DC | PRN
  Administered 2019-01-11: 2 mg via INTRAVENOUS

## 2019-01-11 MED ORDER — CEFAZOLIN SODIUM-DEXTROSE 2-4 GM/100ML-% IV SOLN
2.0000 g | INTRAVENOUS | Status: AC
  Administered 2019-01-11: 08:00:00 2 g via INTRAVENOUS

## 2019-01-11 MED ORDER — ALBUMIN HUMAN 5 % IV SOLN
INTRAVENOUS | Status: DC | PRN
  Administered 2019-01-11: 09:00:00 via INTRAVENOUS

## 2019-01-11 MED ORDER — 0.9 % SODIUM CHLORIDE (POUR BTL) OPTIME
TOPICAL | Status: DC | PRN
  Administered 2019-01-11: 09:00:00 1000 mL

## 2019-01-11 MED ORDER — IPRATROPIUM-ALBUTEROL 0.5-2.5 (3) MG/3ML IN SOLN: 3.0000 mL | Freq: Four times a day (QID) | RESPIRATORY_TRACT | Status: DC | PRN

## 2019-01-11 MED ORDER — DEXAMETHASONE SODIUM PHOSPHATE 10 MG/ML IJ SOLN
INTRAMUSCULAR | Status: AC
  Filled 2019-01-11: qty 1

## 2019-01-11 MED ORDER — DULOXETINE HCL 30 MG PO CPEP
60.0000 mg | ORAL_CAPSULE | Freq: Every day | ORAL | Status: DC
  Administered 2019-01-12: 60 mg via ORAL
  Filled 2019-01-11 (×2): qty 2

## 2019-01-11 MED ORDER — SUCCINYLCHOLINE CHLORIDE 200 MG/10ML IV SOSY
PREFILLED_SYRINGE | INTRAVENOUS | Status: AC
  Filled 2019-01-11: qty 10

## 2019-01-11 MED ORDER — METOPROLOL TARTRATE 25 MG PO TABS
25.0000 mg | ORAL_TABLET | Freq: Two times a day (BID) | ORAL | Status: DC
  Administered 2019-01-12: 10:00:00 25 mg via ORAL
  Filled 2019-01-11: qty 1

## 2019-01-11 MED ORDER — HYDROMORPHONE HCL 1 MG/ML IJ SOLN
0.5000 mg | INTRAMUSCULAR | Status: DC | PRN
  Administered 2019-01-11: 0.5 mg via INTRAVENOUS
  Filled 2019-01-11: qty 0.5

## 2019-01-11 MED ORDER — FENTANYL CITRATE (PF) 100 MCG/2ML IJ SOLN
INTRAMUSCULAR | Status: DC | PRN
  Administered 2019-01-11 (×2): 50 ug via INTRAVENOUS
  Administered 2019-01-11: 150 ug via INTRAVENOUS

## 2019-01-11 MED ORDER — OXYCODONE HCL 5 MG PO TABS
10.0000 mg | ORAL_TABLET | ORAL | Status: DC | PRN
  Administered 2019-01-12 (×4): 10 mg via ORAL
  Filled 2019-01-11 (×4): qty 2

## 2019-01-11 MED ORDER — ONDANSETRON HCL 4 MG/2ML IJ SOLN: 4.0000 mg | Freq: Four times a day (QID) | INTRAMUSCULAR | Status: DC | PRN

## 2019-01-11 MED ORDER — FENTANYL CITRATE (PF) 100 MCG/2ML IJ SOLN
INTRAMUSCULAR | Status: AC
  Filled 2019-01-11: qty 2

## 2019-01-11 MED ORDER — FENTANYL CITRATE (PF) 100 MCG/2ML IJ SOLN
25.0000 ug | INTRAMUSCULAR | Status: DC | PRN
  Administered 2019-01-11: 25 ug via INTRAVENOUS

## 2019-01-11 MED ORDER — CEFAZOLIN SODIUM-DEXTROSE 2-4 GM/100ML-% IV SOLN
2.0000 g | Freq: Three times a day (TID) | INTRAVENOUS | Status: AC
  Administered 2019-01-11 – 2019-01-12 (×2): 2 g via INTRAVENOUS
  Filled 2019-01-11 (×2): qty 100

## 2019-01-11 MED ORDER — ONDANSETRON HCL 4 MG/2ML IJ SOLN
INTRAMUSCULAR | Status: DC | PRN
  Administered 2019-01-11: 4 mg via INTRAVENOUS

## 2019-01-11 MED ORDER — TIOTROPIUM BROMIDE MONOHYDRATE 2.5 MCG/ACT IN AERS: 1.0000 | INHALATION_SPRAY | Freq: Every day | RESPIRATORY_TRACT | Status: DC

## 2019-01-11 MED ORDER — LIDOCAINE HCL (CARDIAC) PF 100 MG/5ML IV SOSY
PREFILLED_SYRINGE | INTRAVENOUS | Status: DC | PRN
  Administered 2019-01-11: 60 mg via INTRAVENOUS

## 2019-01-11 MED ORDER — DEXAMETHASONE SODIUM PHOSPHATE 10 MG/ML IJ SOLN
INTRAMUSCULAR | Status: DC | PRN
  Administered 2019-01-11: 10 mg via INTRAVENOUS

## 2019-01-11 MED ORDER — DOCUSATE SODIUM 100 MG PO CAPS
200.0000 mg | ORAL_CAPSULE | Freq: Two times a day (BID) | ORAL | Status: DC | PRN
  Administered 2019-01-11: 200 mg via ORAL
  Filled 2019-01-11: qty 2

## 2019-01-11 MED ORDER — PROPOFOL 10 MG/ML IV BOLUS
INTRAVENOUS | Status: AC
  Filled 2019-01-11: qty 20

## 2019-01-11 MED ORDER — ATORVASTATIN CALCIUM 10 MG PO TABS
20.0000 mg | ORAL_TABLET | Freq: Every day | ORAL | Status: DC
  Administered 2019-01-11: 18:00:00 20 mg via ORAL
  Filled 2019-01-11: qty 2

## 2019-01-11 MED ORDER — FENTANYL CITRATE (PF) 250 MCG/5ML IJ SOLN
INTRAMUSCULAR | Status: AC
  Filled 2019-01-11: qty 5

## 2019-01-11 MED ORDER — MOMETASONE FURO-FORMOTEROL FUM 200-5 MCG/ACT IN AERO
2.0000 | INHALATION_SPRAY | Freq: Two times a day (BID) | RESPIRATORY_TRACT | Status: DC
  Administered 2019-01-11 – 2019-01-12 (×2): 2 via RESPIRATORY_TRACT
  Filled 2019-01-11: qty 8.8

## 2019-01-11 MED ORDER — SODIUM CHLORIDE 0.9% FLUSH: 3.0000 mL | INTRAVENOUS | Status: DC | PRN

## 2019-01-11 MED ORDER — NITROGLYCERIN 0.4 MG SL SUBL: 0.4000 mg | SUBLINGUAL_TABLET | SUBLINGUAL | Status: DC | PRN

## 2019-01-11 MED ORDER — CEFAZOLIN SODIUM-DEXTROSE 2-4 GM/100ML-% IV SOLN
INTRAVENOUS | Status: AC
  Filled 2019-01-11: qty 100

## 2019-01-11 MED ORDER — SUCCINYLCHOLINE CHLORIDE 20 MG/ML IJ SOLN
INTRAMUSCULAR | Status: DC | PRN
  Administered 2019-01-11: 100 mg via INTRAVENOUS

## 2019-01-11 MED ORDER — ACETAMINOPHEN 650 MG RE SUPP: 650.0000 mg | RECTAL | Status: DC | PRN

## 2019-01-11 MED ORDER — MUPIROCIN 2 % EX OINT
1.0000 "application " | TOPICAL_OINTMENT | Freq: Two times a day (BID) | CUTANEOUS | Status: DC
  Administered 2019-01-11 – 2019-01-12 (×2): 1 via NASAL
  Filled 2019-01-11: qty 22

## 2019-01-11 MED ORDER — ONDANSETRON HCL 4 MG/2ML IJ SOLN
INTRAMUSCULAR | Status: AC
  Filled 2019-01-11: qty 2

## 2019-01-11 SURGICAL SUPPLY — 55 items
BAG DECANTER FOR FLEXI CONT (MISCELLANEOUS) ×3 IMPLANT
BLADE CLIPPER SURG (BLADE) IMPLANT
BLADE SURG 11 STRL SS (BLADE) ×3 IMPLANT
BUR MATCHSTICK NEURO 3.0 LAGG (BURR) ×3 IMPLANT
CANISTER SUCT 3000ML PPV (MISCELLANEOUS) ×3 IMPLANT
COVER WAND RF STERILE (DRAPES) ×3 IMPLANT
DECANTER SPIKE VIAL GLASS SM (MISCELLANEOUS) ×3 IMPLANT
DERMABOND ADVANCED (GAUZE/BANDAGES/DRESSINGS) ×2
DERMABOND ADVANCED .7 DNX12 (GAUZE/BANDAGES/DRESSINGS) ×1 IMPLANT
DRAPE C-ARM 42X72 X-RAY (DRAPES) ×6 IMPLANT
DRAPE HALF SHEET 40X57 (DRAPES) IMPLANT
DRAPE LAPAROTOMY 100X72 PEDS (DRAPES) ×3 IMPLANT
DRAPE MICROSCOPE LEICA (MISCELLANEOUS) ×3 IMPLANT
DURAPREP 6ML APPLICATOR 50/CS (WOUND CARE) ×3 IMPLANT
ELECT COATED BLADE 2.86 ST (ELECTRODE) ×3 IMPLANT
ELECT REM PT RETURN 9FT ADLT (ELECTROSURGICAL) ×3
ELECTRODE REM PT RTRN 9FT ADLT (ELECTROSURGICAL) ×1 IMPLANT
GAUZE 4X4 16PLY RFD (DISPOSABLE) IMPLANT
GLOVE BIO SURGEON STRL SZ7.5 (GLOVE) ×3 IMPLANT
GLOVE BIOGEL PI IND STRL 7.5 (GLOVE) ×2 IMPLANT
GLOVE BIOGEL PI INDICATOR 7.5 (GLOVE) ×4
GLOVE EXAM NITRILE LRG STRL (GLOVE) IMPLANT
GLOVE EXAM NITRILE XL STR (GLOVE) IMPLANT
GLOVE EXAM NITRILE XS STR PU (GLOVE) IMPLANT
GOWN STRL REUS W/ TWL LRG LVL3 (GOWN DISPOSABLE) ×2 IMPLANT
GOWN STRL REUS W/ TWL XL LVL3 (GOWN DISPOSABLE) IMPLANT
GOWN STRL REUS W/TWL 2XL LVL3 (GOWN DISPOSABLE) IMPLANT
GOWN STRL REUS W/TWL LRG LVL3 (GOWN DISPOSABLE) ×4
GOWN STRL REUS W/TWL XL LVL3 (GOWN DISPOSABLE)
HEMOSTAT POWDER KIT SURGIFOAM (HEMOSTASIS) ×3 IMPLANT
KIT BASIN OR (CUSTOM PROCEDURE TRAY) ×3 IMPLANT
KIT TURNOVER KIT B (KITS) ×3 IMPLANT
NDL SPNL 18GX3.5 QUINCKE PK (NEEDLE) ×1 IMPLANT
NEEDLE HYPO 22GX1.5 SAFETY (NEEDLE) ×3 IMPLANT
NEEDLE SPNL 18GX3.5 QUINCKE PK (NEEDLE) ×3 IMPLANT
NS IRRIG 1000ML POUR BTL (IV SOLUTION) ×3 IMPLANT
PACK LAMINECTOMY NEURO (CUSTOM PROCEDURE TRAY) ×3 IMPLANT
PAD ARMBOARD 7.5X6 YLW CONV (MISCELLANEOUS) ×9 IMPLANT
PIN DISTRACTION 14MM (PIN) IMPLANT
PLATE ANT CERV ATLANTIS 19 (Plate) ×2 IMPLANT
RUBBERBAND STERILE (MISCELLANEOUS) ×6 IMPLANT
SCREW SELF TAP VAR 4.0X13 (Screw) ×8 IMPLANT
SEALANT ADHERUS EXTEND TIP (MISCELLANEOUS) ×2 IMPLANT
SPACER BONE CORNERSTONE 5X14 (Orthopedic Implant) ×2 IMPLANT
SPONGE INTESTINAL PEANUT (DISPOSABLE) ×3 IMPLANT
SPONGE SURGIFOAM ABS GEL SZ50 (HEMOSTASIS) IMPLANT
STAPLER VISISTAT 35W (STAPLE) IMPLANT
SUT MNCRL AB 3-0 PS2 18 (SUTURE) ×3 IMPLANT
SUT MNCRL AB 4-0 PS2 18 (SUTURE) ×2 IMPLANT
SUT PROLENE 6 0 BV (SUTURE) ×2 IMPLANT
SUT VIC AB 3-0 SH 8-18 (SUTURE) ×5 IMPLANT
TAPE CLOTH 3X10 TAN LF (GAUZE/BANDAGES/DRESSINGS) ×3 IMPLANT
TOWEL GREEN STERILE (TOWEL DISPOSABLE) ×3 IMPLANT
TOWEL GREEN STERILE FF (TOWEL DISPOSABLE) ×3 IMPLANT
WATER STERILE IRR 1000ML POUR (IV SOLUTION) ×3 IMPLANT

## 2019-01-11 NOTE — Anesthesia Postprocedure Evaluation (Signed)
Anesthesia Post Note  Patient: KERLINE TRAHAN  Procedure(s) Performed: Cervical Five-Six Anterior cervical discectomy fusion, Removal of Cervical Six-Seven plate, Cervical Five to Cervical Seven anterior instrumented fusion (N/A Spine Cervical)     Patient location during evaluation: PACU Anesthesia Type: General Level of consciousness: awake Pain management: pain level controlled Vital Signs Assessment: post-procedure vital signs reviewed and stable Respiratory status: spontaneous breathing Cardiovascular status: stable Postop Assessment: no apparent nausea or vomiting Anesthetic complications: no    Last Vitals:  Vitals:   01/11/19 1240 01/11/19 1605  BP: 116/71 103/67  Pulse: 79 84  Resp: 18 16  Temp: 36.8 C 36.8 C  SpO2: 91% 96%    Last Pain:  Vitals:   01/11/19 1610  TempSrc:   PainSc: 8                  Raeana Blinn

## 2019-01-11 NOTE — Op Note (Signed)
PATIENT: Sara Munoz  PROCEDURE DATE: 01/11/19  PRE-OPERATIVE DIAGNOSIS:  Cervical radiculopathy   POST-OPERATIVE DIAGNOSIS:  Cervical radiculopathy   PROCEDURE:  C5-C6 Anterior Cervical Discectomy and Instrumented Fusion   SURGEON:  Surgeon(s) and Role:    Judith Part, MD - Primary    Sherley Bounds, MD - Assisting   ANESTHESIA: ETGA   BRIEF HISTORY: This is a 53 year old woman s/p a single level ACDF roughly 9-10 months ago that now presented with severe radicular pain in the right C6 distribution with some mild myelopathy. An MRI confirmed the presence of a large disc herniation at C5-6. A trial of non-operative management was attempted with medications, PT, and an epidural steroid injection, but unfortunately did not control her symptoms. This was discussed with the patient as well as risks, benefits, and alternatives and the patient wished to proceed with surgical treatment.   OPERATIVE DETAIL: The patient was taken to the operating room and placed on the OR table in the supine position. A formal time out was performed with two patient identifiers and confirmed the operative site. Anesthesia was induced by the anesthesia team.  The prior right transverse cervical incision was marked and the area was then prepped and draped in a sterile fashion.  The platysma was divided and the sternocleidomastoid muscle was identified. The carotid sheath was palpated, identified, and retracted laterally with the sternocleidomastoid muscle. The strap muscles were identified and retracted medially and the pretracheal fascia was entered. The existing anterior cervical plate was identified at C6-7. A bent spinal needle was inserted into the disc space superior to this level and fluoroscopy was used to confirm the surgical level. The longus colli were elevated bilaterally to fully expose C5-6 and the top of the C6-7 plate and a self-retaining retractor was placed. The endotracheal tube cuff balloon was  deflated and reinflated after retractor placement.   Anterior osteophytes were removed until flush with the anterior vertebral body. The disc annulus was incised and a complete C5-C6 discectomy was performed. The posterior longitudinal ligament was incised followed by ligamentous and bony removal until no central canal stenosis was present. Decompression was then taken out laterally into the bilateral foramina until no foraminal stenosis was palpable. There was calcified ligament that was adherent to the dura and very difficult to remove safely. While removing it, there was a portion of dura that either had a pin hole defect with CSF drainage. This stopped with floseal and did not leak again. Just to be sure, a think layer of Adheris (Stryker) dural sealant was placed over this defect. A 39mm cortical allograft (Medtronic) was then inserted into the disc space as an interbody graft.  After sizing a plate and confirming location with fluoroscopy, I was able to fit a plate at I3-3 without having to remove that plate at A2-5. I therefore secured a new anterior plate (Medtronic) using 4, 7mm screws into the vertebral bodies of C5 and C6. A confirmatory fluoroscopic image was obtained and the screws were final tightened and locked. Hemostasis was then obtained and the incision was closed in layers. All instrument and sponge counts were correct. The patient was then returned to anesthesia for emergence. No apparent complications at the completion of the procedure.   EBL:  57mL   DRAINS: none   SPECIMENS: none   Judith Part, MD 01/11/19 7:41 AM

## 2019-01-11 NOTE — Anesthesia Procedure Notes (Signed)
Procedure Name: Intubation Date/Time: 01/11/2019 7:56 AM Performed by: Lavell Luster, CRNA Pre-anesthesia Checklist: Patient identified, Emergency Drugs available, Suction available, Patient being monitored and Timeout performed Patient Re-evaluated:Patient Re-evaluated prior to induction Oxygen Delivery Method: Circle system utilized Preoxygenation: Pre-oxygenation with 100% oxygen Induction Type: IV induction and Rapid sequence Laryngoscope Size: Mac, 5 and Glidescope Grade View: Grade I Tube size: 7.5 mm Number of attempts: 1 Airway Equipment and Method: Video-laryngoscopy Placement Confirmation: ETT inserted through vocal cords under direct vision,  positive ETCO2 and breath sounds checked- equal and bilateral Secured at: 2 cm Tube secured with: Tape Dental Injury: Teeth and Oropharynx as per pre-operative assessment  Difficulty Due To: Difficulty was anticipated, Difficult Airway- due to reduced neck mobility and Difficult Airway-  due to neck instability Future Recommendations: Recommend- induction with short-acting agent, and alternative techniques readily available

## 2019-01-11 NOTE — H&P (Signed)
Surgical H&P Update  HPI: 53 y.o. woman with cervical radiculopathy, here for surgical treatment. She originally had a single level ACDF 9 months ago and did well, but presented with new right upper extremity radiculopathy in a C6 distribution. Repeat MRI showed a large disc herniation at C5-6 with central and foraminal stenosis.  No changes in health since she was last seen. Still having right greater than left upper extremity radicular symptoms and wishes to proceed with surgery.  PMHx:  Past Medical History:  Diagnosis Date  . Anxiety   . Asthma   . Atypical chest pain 08/02/2015  . CAD (coronary artery disease)   . Chronic back pain   . COPD (chronic obstructive pulmonary disease) ([place])   . Coronary artery disease    a. Mild-non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/2014  . GERD (gastroesophageal reflux disease)   . Hypercholesteremia   . Hypertension   . Hypokalemia   . Hypomagnesemia 01/04/2014  . Liver disease   . MI (myocardial infarction) ([place])   . Opiate use 02/27/2016  . Osteoarthritis   . Stroke [facility])    FamHx:  Family History  Problem Relation Age of Onset  . Heart disease Mother   . Lung cancer Mother   . Ovarian cancer Mother   . Healthy Brother   . Healthy Brother   . Healthy Brother   . Diabetes Maternal Uncle   . Breast cancer Maternal Aunt    SocHx:  reports that she has been smoking cigarettes. She has a 17.50 pack-year smoking history. She has never used smokeless tobacco. She reports current alcohol use. She reports current drug use. Drug: Marijuana.  Physical Exam: AOx3, PERRL, FS, TM  Strength 5/5 x4, SILTx4 except numbness in the R C6 distributions, mild hoffman's on the R >L  Assesment/Plan: 53 y.o. woman with woman, here for treatment of cervical radiculopathy. Risks, benefits, and alternatives discussed and the patient would like to continue with surgery.  -OR today for adjacent segment ACDF at C5-6 -3C post-op  Judith Part, MD 01/11/19 7:05  AM

## 2019-01-11 NOTE — Evaluation (Signed)
Physical Therapy Evaluation Patient Details Name: Sara Munoz MRN: [id number] DOB: Oct 12, 1965 Today's Date: 01/11/2019   History of Present Illness  Pt admit for C5-6 ACDF.    Clinical Impression  Pt admitted with above diagnosis. Pt currently with functional limitations due to the deficits listed below (see PT Problem List). Pt was able to ambulate with min guard assist and cues with 1 UE support due to c/o dizziness with ambulation.  Should progress well when pt has not had pain meds and anesthesia.  Plan to see in am as pt has 10 steps to enter home.   Pt will benefit from skilled PT to increase their independence and safety with mobility to allow discharge to the venue listed below.      Follow Up Recommendations No PT follow up    Equipment Recommendations  None recommended by PT    Recommendations for Other Services       Precautions / Restrictions Precautions Precautions: Fall;Cervical Precaution Booklet Issued: Yes (comment) Restrictions Weight Bearing Restrictions: No      Mobility  Bed Mobility Overal bed mobility: Independent                Transfers Overall transfer level: Independent                  Ambulation/Gait Ambulation/Gait assistance: Min guard;Min assist Gait Distance (Feet): 80 Feet Assistive device: None;1 person hand held assist Gait Pattern/deviations: Step-through pattern;Decreased stride length;Drifts right/left   Gait velocity interpretation: <1.31 ft/sec, indicative of household ambulator General Gait Details: Pt slightly unsteady with gait.  Pt reports dizziness however BP ok when checked.  Pt had had meds and surgery today.  Limited distance due to pt report of dizziness.   Stairs            Wheelchair Mobility    Modified Rankin (Stroke Patients Only)       Balance Overall balance assessment: Needs assistance Sitting-balance support: No upper extremity supported;Feet supported Sitting balance-Leahy Scale:  Fair     Standing balance support: No upper extremity supported;During functional activity Standing balance-Leahy Scale: Poor Standing balance comment: Relying on 1UE support for balance due to dizzziness                             Pertinent Vitals/Pain Pain Assessment: Faces Faces Pain Scale: Hurts whole lot Pain Location: neck Pain Descriptors / Indicators: Aching;Grimacing;Guarding Pain Intervention(s): Limited activity within patient's tolerance;Monitored during session;Premedicated before session;Repositioned    Home Living Family/patient expects to be discharged to:: Private residence Living Arrangements: Spouse/significant other(spouse works) Available Help at Discharge: Family;Available 24 hours/day(brother) Type of Home: House Home Access: Stairs to enter Entrance Stairs-Rails: Right;Left;Can reach both Entrance Stairs-Number of Steps: 10 Home Layout: One level Home Equipment: Hand held shower head      Prior Function Level of Independence: Independent               Hand Dominance        Extremity/Trunk Assessment   Upper Extremity Assessment Upper Extremity Assessment: Defer to OT evaluation    Lower Extremity Assessment Lower Extremity Assessment: Generalized weakness    Cervical / Trunk Assessment Cervical / Trunk Assessment: Normal  Communication   Communication: No difficulties  Cognition Arousal/Alertness: Awake/alert Behavior During Therapy: WFL for tasks assessed/performed Overall Cognitive Status: Within Functional Limits for tasks assessed  General Comments      Exercises     Assessment/Plan    PT Assessment Patient needs continued PT services  PT Problem List Decreased activity tolerance;Decreased balance;Decreased mobility;Decreased knowledge of use of DME;Decreased safety awareness;Decreased knowledge of precautions;Pain       PT Treatment Interventions DME  instruction;Gait training;Stair training;Functional mobility training;Therapeutic activities;Therapeutic exercise;Balance training;Patient/family education    PT Goals (Current goals can be found in the Care Plan section)  Acute Rehab PT Goals Patient Stated Goal: to go home PT Goal Formulation: With patient Time For Goal Achievement: 01/25/19 Potential to Achieve Goals: Good    Frequency Min 5X/week   Barriers to discharge        Co-evaluation               AM-PAC PT "6 Clicks" Mobility  Outcome Measure Help needed turning from your back to your side while in a flat bed without using bedrails?: None Help needed moving from lying on your back to sitting on the side of a flat bed without using bedrails?: None Help needed moving to and from a bed to a chair (including a wheelchair)?: A Little Help needed standing up from a chair using your arms (e.g., wheelchair or bedside chair)?: A Little Help needed to walk in hospital room?: A Little Help needed climbing 3-5 steps with a railing? : A Little 6 Click Score: 20    End of Session Equipment Utilized During Treatment: Gait belt Activity Tolerance: Patient limited by fatigue Patient left: in chair;with call bell/phone within reach Nurse Communication: Mobility status PT Visit Diagnosis: Unsteadiness on feet (R26.81);Muscle weakness (generalized) (M62.81);Pain Pain - part of body: (neck)    Time: 8453-6468 PT Time Calculation (min) (ACUTE ONLY): 16 min   Charges:   PT Evaluation $PT Eval Moderate Complexity: [place] Pager:  [phone]  Office:  [phone]    Denice Paradise 01/11/2019, 3:33 PM

## 2019-01-11 NOTE — Transfer of Care (Addendum)
Immediate Anesthesia Transfer of Care Note  Patient: Sara Munoz  Procedure(s) Performed: Cervical Five-Six Anterior cervical discectomy fusion, Removal of Cervical Six-Seven plate, Cervical Five to Cervical Seven anterior instrumented fusion (N/A Spine Cervical)  Patient Location: PACU  Anesthesia Type:General  Level of Consciousness: awake, alert  and oriented  Airway & Oxygen Therapy: Patient connected to face mask oxygen  Post-op Assessment: Post -op Vital signs reviewed and stable  Post vital signs: stable  Last Vitals:  Vitals Value Taken Time  BP 92/54 01/11/19 1108  Temp    Pulse 91 01/11/19 1113  Resp 20 01/11/19 1113  SpO2 96 % 01/11/19 1113  Vitals shown include unvalidated device data.  Last Pain:  Vitals:   01/11/19 0707  TempSrc:   PainSc: 7       Patients Stated Pain Goal: 2 (69/48/54 6270)  Complications: No apparent anesthesia complications

## 2019-01-11 NOTE — Brief Op Note (Signed)
01/11/2019  11:03 AM  PATIENT:  [name]  53 y.o. female  PRE-OPERATIVE DIAGNOSIS:  Cervical radiculopathy  POST-OPERATIVE DIAGNOSIS:  Cervical radiculopathy  PROCEDURE:  C5-6 ACDF  SURGEON:  Surgeon(s) and Role:    * Ostergard, Joyice Faster, MD - Primary    * Eustace Moore, MD - Assisting  PHYSICIAN ASSISTANT:   ANESTHESIA:   general  EBL:  25 mL   BLOOD ADMINISTERED:none  DRAINS: none   LOCAL MEDICATIONS USED:  LIDOCAINE   SPECIMEN:  No Specimen  DISPOSITION OF SPECIMEN:  N/A  COUNTS:  YES  TOURNIQUET:  * No tourniquets in log *  DICTATION: .Note written in EPIC  PLAN OF CARE: Admit for overnight observation  PATIENT DISPOSITION:  PACU - hemodynamically stable.   Delay start of Pharmacological VTE agent (>24hrs) due to surgical blood loss or risk of bleeding: yes

## 2019-01-12 ENCOUNTER — Encounter (HOSPITAL_COMMUNITY): Payer: Self-pay | Admitting: Neurological Surgery

## 2019-01-12 DIAGNOSIS — M5412 Radiculopathy, cervical region: Secondary | ICD-10-CM | POA: Diagnosis not present

## 2019-01-12 DIAGNOSIS — M50122 Cervical disc disorder at C5-C6 level with radiculopathy: Secondary | ICD-10-CM | POA: Diagnosis not present

## 2019-01-12 NOTE — Progress Notes (Signed)
Neurosurgery Service Progress Note  Subjective: No acute events overnight, no further radicular pain in RUE but increased numbness from preop   Objective: Vitals:   01/11/19 2029 01/11/19 2041 01/11/19 2352 01/12/19 0334  BP: 106/73  118/70 120/68  Pulse: 89  87 96  Resp: 16  16 18   Temp: 98.6 F (37 C)  98.7 F (37.1 C) 97.9 F (36.6 C)  TempSrc: Oral  Oral Oral  SpO2: 91% 94% 94% 96%  Weight:      Height:       Temp (24hrs), Avg:98 F (36.7 C), Min:97.3 F (36.3 C), Max:98.7 F (37.1 C)  CBC Latest Ref Rng & Units 01/07/2019 12/24/2018 10/20/2018  WBC 4.0 - 10.5 K/uL 11.2(H) 7.1 8.5  Hemoglobin 12.0 - 15.0 g/dL 13.4 13.4 10.7(L)  Hematocrit 36.0 - 46.0 % 41.9 41.5 33.2(L)  Platelets 150 - 400 K/uL 251 208 77(L)   BMP Latest Ref Rng & Units 01/07/2019 12/24/2018 10/22/2018  Glucose 70 - 99 mg/dL 92 91 89  BUN 6 - 20 mg/dL 11 11 10   Creatinine 0.44 - 1.00 mg/dL 0.77 0.65 0.77  BUN/Creat Ratio 9 - 23 - - -  Sodium 135 - 145 mmol/L 135 139 139  Potassium 3.5 - 5.1 mmol/L 4.3 3.7 3.3(L)  Chloride 98 - 111 mmol/L 102 105 102  CO2 22 - 32 mmol/L 23 24 28   Calcium 8.9 - 10.3 mg/dL 9.6 9.1 9.1    Intake/Output Summary (Last 24 hours) at 01/12/2019 0708 Last data filed at 01/11/2019 1200 Gross per 24 hour  Intake 1350 ml  Output 25 ml  Net 1325 ml    Current Facility-Administered Medications:  .  0.9 %  sodium chloride infusion, 250 mL, Intravenous, Continuous, Ostergard, Thomas A, MD .  acetaminophen (TYLENOL) tablet 650 mg, 650 mg, Oral, Q4H PRN **OR** acetaminophen (TYLENOL) suppository 650 mg, 650 mg, Rectal, Q4H PRN, Judith Part, MD .  atorvastatin (LIPITOR) tablet 20 mg, 20 mg, Oral, q1800, Judith Part, MD, 20 mg at 01/11/19 1738 .  docusate sodium (COLACE) capsule 200 mg, 200 mg, Oral, BID PRN, Judith Part, MD, 200 mg at 01/11/19 2100 .  DULoxetine (CYMBALTA) DR capsule 60 mg, 60 mg, Oral, Daily, Ostergard, Thomas A, MD .  gabapentin (NEURONTIN)  tablet 600 mg, 600 mg, Oral, TID, Judith Part, MD, 600 mg at 01/11/19 2100 .  HYDROmorphone (DILAUDID) injection 0.5 mg, 0.5 mg, Intravenous, Q3H PRN, Judith Part, MD, 0.5 mg at 01/11/19 1541 .  ipratropium-albuterol (DUONEB) 0.5-2.5 (3) MG/3ML nebulizer solution 3 mL, 3 mL, Nebulization, Q6H PRN, Judith Part, MD .  lactated ringers infusion, , Intravenous, Continuous, Belinda Block, MD, Last Rate: 10 mL/hr at 01/11/19 0724 .  lisinopril (ZESTRIL) tablet 40 mg, 40 mg, Oral, Daily, Ostergard, Joyice Faster, MD, 40 mg at 01/11/19 1332 .  menthol-cetylpyridinium (CEPACOL) lozenge 3 mg, 1 lozenge, Oral, PRN **OR** phenol (CHLORASEPTIC) mouth spray 1 spray, 1 spray, Mouth/Throat, PRN, Ostergard, Thomas A, MD .  metoprolol tartrate (LOPRESSOR) tablet 25 mg, 25 mg, Oral, BID, Ostergard, Joyice Faster, MD .  mometasone-formoterol (DULERA) 200-5 MCG/ACT inhaler 2 puff, 2 puff, Inhalation, BID, Judith Part, MD, 2 puff at 01/11/19 2040 .  mupirocin ointment (BACTROBAN) 2 % 1 application, 1 application, Nasal, BID, Judith Part, MD, 1 application at 49/67/59 2059 .  nitroGLYCERIN (NITROSTAT) SL tablet 0.4 mg, 0.4 mg, Sublingual, Q5 Min x 3 PRN, Ostergard, Thomas A, MD .  ondansetron (ZOFRAN) tablet 4 mg,  4 mg, Oral, Q6H PRN **OR** ondansetron (ZOFRAN) injection 4 mg, 4 mg, Intravenous, Q6H PRN, Ostergard, Thomas A, MD .  oxyCODONE (Oxy IR/ROXICODONE) immediate release tablet 10 mg, 10 mg, Oral, Q3H PRN, Judith Part, MD, 10 mg at 01/12/19 2035 .  oxyCODONE (Oxy IR/ROXICODONE) immediate release tablet 5 mg, 5 mg, Oral, Q3H PRN, Ostergard, Thomas A, MD .  sodium chloride flush (NS) 0.9 % injection 3 mL, 3 mL, Intravenous, Q12H, Ostergard, Joyice Faster, MD, 3 mL at 01/11/19 2102 .  sodium chloride flush (NS) 0.9 % injection 3 mL, 3 mL, Intravenous, PRN, Judith Part, MD .  tiZANidine (ZANAFLEX) tablet 4 mg, 4 mg, Oral, TID, Judith Part, MD, 4 mg at 01/11/19  2100   Physical Exam: AOx3, PERRL, EOMI, FS, Strength 5/5 except 4/5 pain limited in all groups in RUE, SILTx4 except for RUE C6 distribution numbness Incision c/d/i, neck soft  Assessment & Plan: 53 y.o. woman s/p adjacent level C5-6 ACDF, recovering well.  -discharge home today vs tomorrow morning -f/u PT/OT evals  Judith Part  01/12/19 7:08 AM

## 2019-01-12 NOTE — Plan of Care (Signed)
Pt doing well. Pt given D/C instructions with verbal understanding. Pt's incision is clean and dry with no sign of infection. Pt's IV was removed prior to D/C. Pt received cane and shower seat from [place] per MD order. Pt is stable @ D/C and has no other needs at this time. Holli Humbles, RN

## 2019-01-12 NOTE — Discharge Summary (Signed)
Discharge Summary  Date of Admission: 01/11/2019  Date of Discharge: 01/12/19  Attending Physician: Emelda Brothers, MD  Hospital Course: Patient was admitted following an uncomplicated T9-4 ACDF. She was recovered in PACU and transferred to [facility]. Her hospital course was uncomplicated and the patient was discharged home on 01/13/2019. She will follow up in clinic with me in 2 weeks.  Neurologic exam at discharge:  AOx3, PERRL, EOMI, FS, TM Strength 5/5 x4, SILTx4 except for R C6 distribution numnbess  Discharge diagnosis: Cervical radiculopathy  Judith Part, MD 01/12/19 7:07 AM

## 2019-01-12 NOTE — Discharge Instructions (Signed)
Discharge Instructions ° °No restriction in activities, slowly increase your activity back to normal.  ° °Your incision is closed with dermabond (purple glue). This will naturally fall off over the next 1-2 weeks.  ° °Okay to shower on the day of discharge. Use regular soap and water and try to be gentle when cleaning your incision.  ° °Follow up with Dr. Zetha Kuhar in 2 weeks after discharge. If you do not already have a discharge appointment, please call his office at [phone] to schedule a follow up appointment. If you have any concerns or questions, please call the office and let us know. °

## 2019-01-12 NOTE — Progress Notes (Signed)
Physical Therapy Treatment Patient Details Name: Sara Munoz MRN: [id number] DOB: [birth year] Today's Date: 01/12/2019    History of Present Illness Pt admit for C5-6 ACDF on 01/11/2019.  PMH significant for CVA, OA, MI, liver disease, HTN, CAD, COPD, CAD, chronic back pain.    PT Comments    Pt progressing towards physical therapy goals. Pt reports she will not have assist at home until tomorrow, and prefers to wait for d/c until then. Pt was educated on precautions, stair negotiation, car transfer, activity progression, and general safety.  Will continue to follow and progress as able per POC.   Follow Up Recommendations  No PT follow up     Equipment Recommendations  None recommended by PT    Recommendations for Other Services       Precautions / Restrictions Precautions Precautions: Fall;Cervical Precaution Booklet Issued: Yes (comment) Precaution Comments: Pt was verbally cued for precautions during functional mobility.  Required Braces or Orthoses: (No brace needed order) Restrictions Weight Bearing Restrictions: No    Mobility  Bed Mobility Overal bed mobility: Independent                Transfers Overall transfer level: Modified independent Equipment used: None             General transfer comment: Increased time. Pt appears slightly unsteady initially but recovers without difficulty or assist.   Ambulation/Gait Ambulation/Gait assistance: Min guard Gait Distance (Feet): 300 Feet Assistive device: None Gait Pattern/deviations: Step-through pattern;Decreased stride length;Drifts right/left Gait velocity: Decreased Gait velocity interpretation: <1.8 ft/sec, indicate of risk for recurrent falls General Gait Details: Occasional mild unsteadiness but without overt LOB. Close guard for safety. Pt reaching out to hold to railings and wall - feel a SPC would be helpful for balance and energy conservation as pt was dyspneic after stair training.     Stairs Stairs: Yes Stairs assistance: Min guard Stair Management: One rail Right;Alternating pattern;Forwards Number of Stairs: 10 General stair comments: Pt with alternating step pattern - cued for step-to for safety as pt mildly unsteady, but pt did not make any corrective changes.    Wheelchair Mobility    Modified Rankin (Stroke Patients Only)       Balance Overall balance assessment: Needs assistance Sitting-balance support: No upper extremity supported;Feet supported Sitting balance-Leahy Scale: Fair     Standing balance support: No upper extremity supported;During functional activity Standing balance-Leahy Scale: Poor Standing balance comment: Relying on 1UE support for balance due to dizziness                            Cognition Arousal/Alertness: Awake/alert Behavior During Therapy: WFL for tasks assessed/performed Overall Cognitive Status: Within Functional Limits for tasks assessed                                        Exercises      General Comments        Pertinent Vitals/Pain Pain Assessment: Faces Faces Pain Scale: Hurts a little bit Pain Location: neck/incision site Pain Descriptors / Indicators: Aching;Grimacing;Guarding Pain Intervention(s): Monitored during session    Home Living                      Prior Function            PT Goals (current goals can now be found  in the care plan section) Acute Rehab PT Goals Patient Stated Goal: to go home PT Goal Formulation: With patient Time For Goal Achievement: 01/25/19 Potential to Achieve Goals: Good Progress towards PT goals: Progressing toward goals    Frequency    Min 5X/week      PT Plan Current plan remains appropriate    Co-evaluation              AM-PAC PT "6 Clicks" Mobility   Outcome Measure  Help needed turning from your back to your side while in a flat bed without using bedrails?: None Help needed moving from lying  on your back to sitting on the side of a flat bed without using bedrails?: None Help needed moving to and from a bed to a chair (including a wheelchair)?: A Little Help needed standing up from a chair using your arms (e.g., wheelchair or bedside chair)?: A Little Help needed to walk in hospital room?: A Little Help needed climbing 3-5 steps with a railing? : A Little 6 Click Score: 20    End of Session Equipment Utilized During Treatment: Gait belt Activity Tolerance: Patient limited by fatigue Patient left: in chair;with call bell/phone within reach Nurse Communication: Mobility status PT Visit Diagnosis: Unsteadiness on feet (R26.81);Muscle weakness (generalized) (M62.81);Pain Pain - part of body: (neck)     Time: 3403-7096 PT Time Calculation (min) (ACUTE ONLY): 13 min  Charges:  $Gait Training: 8-22 mins                     Rolinda Roan, PT, DPT Acute Rehabilitation Services Pager: [phone] Office: [phone]   Thelma Comp 01/12/2019, 8:58 AM

## 2019-01-12 NOTE — Progress Notes (Signed)
OT Evaluation  Completed all information regarding ADL and functional mobility while adhering to cervical precautions. Pt issued long handled sponge and reacher to assist with ADL and IADL tasks. Written information provided. Educated on safety and reducing risk of falls. No further OT needed.     01/12/19 1100  OT Visit Information  Last OT Received On 01/12/19  Assistance Needed +1  History of Present Illness Pt admit for C5-6 ACDF on 01/11/2019.  PMH significant for CVA, OA, MI, liver disease, HTN, CAD, COPD, CAD, chronic back pain.  Precautions  Precautions Fall;Cervical  Precaution Booklet Issued Yes (comment)  Home Living  Family/patient expects to be discharged to: Private residence  Living Arrangements Spouse/significant other  Available Help at Discharge Family;Available 24 hours/day  Type of Home House  Home Access Stairs to enter  Entrance Stairs-Number of Steps 10  Entrance Stairs-Rails Right;Left;Can reach both  Home Layout One level  Bathroom Biomedical scientist Yes  How Accessible Accessible via walker  Home Equipment Hand held shower head  Prior Function  Level of Independence Independent  Communication  Communication No difficulties  Pain Assessment  Pain Assessment 0-10  Pain Score 3  Pain Location neck/incision site  Pain Descriptors / Indicators Aching;Grimacing;Guarding  Pain Intervention(s) Limited activity within patient's tolerance  Cognition  Arousal/Alertness Awake/alert  Behavior During Therapy WFL for tasks assessed/performed  Overall Cognitive Status Within Functional Limits for tasks assessed  Upper Extremity Assessment  Upper Extremity Assessment RUE deficits/detail  RUE Deficits / Details minmal R handed weakness; pt states improved since surgery  Lower Extremity Assessment  Lower Extremity Assessment Defer to PT evaluation  Cervical / Trunk Assessment  Cervical / Trunk Assessment   (cervical surgery)  ADL  Overall ADL's  Needs assistance/impaired  Functional mobility during ADLs Modified independent  General ADL Comments Educated pt on cervical precautions for ADL. Pt able to complete figure 4 positioning for LB ADL. Educated on precautions for grooming and bathing. Pt issued long handled sponge and reacher to assist with ADl adn IADL tasks. Pt will need a shower seat as she has had multiple falls. Pt has 3 large dogs. discussed need for someone to keep dogs from jumoing on her. Pt verbalized understanding. Pt sleeps on a love seat. Reviewed safe techniques to get on/off couch.   Bed Mobility  General bed mobility comments reviewed techniques  Transfers  Overall transfer level Modified independent  Balance  Overall balance assessment History of Falls  OT - End of Session  Activity Tolerance Patient tolerated treatment well  Patient left in chair;with call bell/phone within reach  Nurse Communication Mobility status;Other (comment) (DC needs)  OT Assessment  OT Recommendation/Assessment Patient does not need any further OT services  OT Visit Diagnosis Muscle weakness (generalized) (M62.81);Pain  Pain - part of body  (neck)  OT Problem List Decreased safety awareness;Decreased knowledge of use of DME or AE;Decreased knowledge of precautions;Impaired sensation;Pain  AM-PAC OT "6 Clicks" Daily Activity Outcome Measure (Version 2)  Help from another person eating meals? 4  Help from another person taking care of personal grooming? 4  Help from another person toileting, which includes using toliet, bedpan, or urinal? 4  Help from another person bathing (including washing, rinsing, drying)? 4  Help from another person to put on and taking off regular upper body clothing? 4  Help from another person to put on and taking off regular lower body clothing? 4  6 Click Score 24  OT Recommendation  Follow Up Recommendations No OT follow up;Supervision - Intermittent  OT  Equipment Tub/shower seat  Acute Rehab OT Goals  Patient Stated Goal to go home  OT Goal Formulation All assessment and education complete, DC therapy  OT Time Calculation  OT Start Time (ACUTE ONLY) 0932  OT Stop Time (ACUTE ONLY) 0953  OT Time Calculation (min) 21 min  OT General Charges  $OT Visit 1 Visit  OT Evaluation  $OT Eval Low Complexity 1 Low  Written Expression  Dominant Hand Right  Maurie Boettcher, OT/L   Acute OT Clinical Specialist Acute Rehabilitation Services Pager [phone] Office [phone]

## 2019-01-19 DIAGNOSIS — H5203 Hypermetropia, bilateral: Secondary | ICD-10-CM | POA: Diagnosis not present

## 2019-01-20 ENCOUNTER — Ambulatory Visit: Payer: Medicaid Other | Attending: Neurological Surgery

## 2019-01-22 ENCOUNTER — Encounter (HOSPITAL_COMMUNITY): Payer: Self-pay | Admitting: Neurological Surgery

## 2019-01-28 DIAGNOSIS — I10 Essential (primary) hypertension: Secondary | ICD-10-CM | POA: Diagnosis not present

## 2019-01-28 DIAGNOSIS — M5412 Radiculopathy, cervical region: Secondary | ICD-10-CM | POA: Diagnosis not present

## 2019-01-28 DIAGNOSIS — Z6830 Body mass index (BMI) 30.0-30.9, adult: Secondary | ICD-10-CM | POA: Diagnosis not present

## 2019-02-03 DIAGNOSIS — M545 Low back pain: Secondary | ICD-10-CM | POA: Diagnosis not present

## 2019-02-03 DIAGNOSIS — M25511 Pain in right shoulder: Secondary | ICD-10-CM | POA: Diagnosis not present

## 2019-02-03 DIAGNOSIS — M542 Cervicalgia: Secondary | ICD-10-CM | POA: Diagnosis not present

## 2019-02-03 DIAGNOSIS — M25512 Pain in left shoulder: Secondary | ICD-10-CM | POA: Diagnosis not present

## 2019-02-05 ENCOUNTER — Telehealth: Payer: Self-pay | Admitting: Physician Assistant

## 2019-02-05 DIAGNOSIS — R110 Nausea: Secondary | ICD-10-CM

## 2019-02-05 MED ORDER — ONDANSETRON 8 MG PO TBDP: 8.0000 mg | ORAL_TABLET | Freq: Two times a day (BID) | ORAL | 1 refills | Status: DC

## 2019-02-05 NOTE — Telephone Encounter (Signed)
Pt is feeling nauseated and needing a refill on:  ondansetron (ZOFRAN-ODT) 8 MG disintegrating tablet - today if possible.  Please call into:  [id number] - [place], [place] - [address] [phone] (Phone) [phone] (Fax)   Thanks, [employer]

## 2019-02-05 NOTE — Telephone Encounter (Signed)
Please review

## 2019-03-02 ENCOUNTER — Ambulatory Visit: Payer: Self-pay | Admitting: Physician Assistant

## 2019-03-02 NOTE — Progress Notes (Deleted)
Patient: Sara Munoz Female    DOB: [birth year]   53 y.o.   MRN: [id number] Visit Date: 03/02/2019  Today's Provider: [name], [name]   No chief complaint on file.  Subjective:     HPI   Hypertension, follow-up:  BP Readings from Last 3 Encounters:  01/12/19 114/62  01/07/19 (!) 159/80  12/24/18 (!) 150/99    She was last seen for hypertension 9 months ago.  BP at that visit was ***. Management since that visit includes; Added HCTZ 25 mg qd uncontrolled HTN. .She reports {excellent/good/fair/poor:19665} compliance with treatment. She {ACTION; IS/IS [id number] having side effects. *** She {is/is not:9024} exercising. She {is/is not:9024} adherent to low salt diet.   Outside blood pressures are ***. She is experiencing {Symptoms; cardiac:12860}.  Patient denies {Symptoms; cardiac:12860}.   Cardiovascular risk factors include {cv risk factors:510}.  Use of agents associated with hypertension: {bp agents assoc with hypertension:511::"none"}.   ------------------------------------------------------------------------    Lipid/Cholesterol, Follow-up:   Last seen for this 9 months ago.  Management since that visit includes; no changes.  Last Lipid Panel:    Component Value Date/Time   CHOL 251 (H) 02/20/2018 1230   TRIG 220 (H) 02/20/2018 1230   HDL 46 02/20/2018 1230   CHOLHDL 5.5 (H) 02/20/2018 1230   CHOLHDL 5.3 08/02/2015 0606   VLDL 24 08/02/2015 0606   LDLCALC 161 (H) 02/20/2018 1230    She reports {excellent/good/fair/poor:19665} compliance with treatment. She {ACTION; IS/IS [id number] having side effects. ***  Wt Readings from Last 3 Encounters:  01/11/19 182 lb (82.6 kg)  01/07/19 183 lb 4.8 oz (83.1 kg)  12/24/18 185 lb (83.9 kg)    ------------------------------------------------------------------------    Allergies  Allergen Reactions  . Flexeril [Cyclobenzaprine] Hives and Swelling    Facial/lip swelling     .  Levofloxacin Hives and Swelling  . Ketorolac   . Phenergan [Promethazine Hcl] Other (See Comments)    Agitation.   . Toradol [Ketorolac Tromethamine] Swelling and Other (See Comments)    Reaction:  Facial/tongue swelling  Reaction:  Facial/tongue swelling   . Tramadol Hives, Swelling and Other (See Comments)    Reaction:  Lip swelling  Reaction:  Lip swelling   . Zoloft [Sertraline Hcl] Swelling    Tongue swelling        Current Outpatient Medications:  .  albuterol (PROVENTIL HFA;VENTOLIN HFA) 108 (90 Base) MCG/ACT inhaler, Inhale 2 puffs into the lungs every 6 (six) hours as needed for wheezing or shortness of breath., Disp: 1 Inhaler, Rfl: 2 .  albuterol (VENTOLIN HFA) 108 (90 Base) MCG/ACT inhaler, Inhale 2 puffs into the lungs every 4 (four) hours as needed for wheezing or shortness of breath., Disp: 6.7 g, Rfl: 0 .  atorvastatin (LIPITOR) 20 MG tablet, Take 1 tablet (20 mg total) by mouth daily., Disp: 90 tablet, Rfl: 3 .  budesonide-formoterol (SYMBICORT) 160-4.5 MCG/ACT inhaler, Inhale 2 puffs into the lungs 2 (two) times daily., Disp: 1 Inhaler, Rfl: 0 .  docusate sodium (COLACE) 100 MG capsule, Take 200 mg by mouth 2 (two) times daily as needed for mild constipation., Disp: , Rfl:  .  DULoxetine (CYMBALTA) 60 MG capsule, Take 1 capsule (60 mg total) by mouth daily., Disp: 30 capsule, Rfl: 12 .  gabapentin (NEURONTIN) 600 MG tablet, Take 600 mg by mouth 3 (three) times daily., Disp: , Rfl:  .  ipratropium-albuterol (DUONEB) 0.5-2.5 (3) MG/3ML SOLN, Take 3 mLs by nebulization every 6 (six) hours  as needed. (Patient taking differently: Take 3 mLs by nebulization every 6 (six) hours as needed (asthma). ), Disp: 360 mL, Rfl: 5 .  lisinopril (PRINIVIL,ZESTRIL) 40 MG tablet, Take 1 tablet (40 mg total) by mouth daily., Disp: 90 tablet, Rfl: 1 .  metoprolol tartrate (LOPRESSOR) 25 MG tablet, TAKE 1 TABLET BY MOUTH TWICE A DAY, Disp: 180 tablet, Rfl: 0 .  Naloxone HCl (NARCAN IJ), Inject  as directed as needed., Disp: , Rfl:  .  nitroGLYCERIN (NITROSTAT) 0.4 MG SL tablet, Place 1 tablet (0.4 mg total) under the tongue every 5 (five) minutes x 3 doses as needed for chest pain., Disp: 30 tablet, Rfl: 2 .  omeprazole (PRILOSEC) 40 MG capsule, Take 1 capsule (40 mg total) by mouth daily., Disp: 90 capsule, Rfl: 3 .  ondansetron (ZOFRAN-ODT) 8 MG disintegrating tablet, Take 1 tablet (8 mg total) by mouth 2 (two) times daily. 30-day rx. Please call [phone] to schedule appt for continued refills., Disp: 20 tablet, Rfl: 1 .  Oxycodone HCl 10 MG TABS, Take 10 mg by mouth 4 (four) times daily. , Disp: , Rfl:  .  rizatriptan (MAXALT-MLT) 10 MG disintegrating tablet, Take 1 tablet (10 mg total) by mouth as needed. May repeat in 2 hours if needed, Disp: 15 tablet, Rfl: 6 .  SPIRIVA RESPIMAT 2.5 MCG/ACT AERS, INHALE 1 PUFF BY MOUTH EVERY DAY (Patient taking differently: Inhale 1 puff into the lungs daily. ), Disp: 4 g, Rfl: 0 .  tiZANidine (ZANAFLEX) 4 MG tablet, Take 4 mg by mouth 3 (three) times daily. , Disp: , Rfl:   Review of Systems  Constitutional: Negative for appetite change, chills, fatigue and fever.  Respiratory: Negative for chest tightness and shortness of breath.   Cardiovascular: Negative for chest pain and palpitations.  Gastrointestinal: Negative for abdominal pain, nausea and vomiting.  Neurological: Negative for dizziness and weakness.    Social History   Tobacco Use  . Smoking status: Current Every Day Smoker    Packs/day: 0.50    Years: 35.00    Pack years: 17.50    Types: Cigarettes  . Smokeless tobacco: Never Used  Substance Use Topics  . Alcohol use: Yes    Alcohol/week: 0.0 standard drinks      Objective:   LMP 12/16/2017 (Approximate)  There were no vitals filed for this visit.There is no height or weight on file to calculate BMI.   Physical Exam   No results found for any visits on 03/03/19.     Assessment & [place],  [name]  [facility]

## 2019-03-03 ENCOUNTER — Ambulatory Visit: Payer: Self-pay | Admitting: Physician Assistant

## 2019-03-08 ENCOUNTER — Telehealth: Payer: Self-pay | Admitting: Internal Medicine

## 2019-03-08 ENCOUNTER — Other Ambulatory Visit: Payer: Self-pay

## 2019-03-08 NOTE — Telephone Encounter (Signed)
Pt is aware of date/time of covid test and voiced her understanding.  Nothing further is needed.  

## 2019-03-10 ENCOUNTER — Other Ambulatory Visit: Admission: RE | Admit: 2019-03-10 | Payer: Medicaid Other | Source: Ambulatory Visit

## 2019-03-10 ENCOUNTER — Telehealth: Payer: Self-pay | Admitting: Internal Medicine

## 2019-03-10 NOTE — Telephone Encounter (Signed)
PFT has been R/S to first available Tues 05/18/19 at 4:15. Contacted patient and advised her of new date and time. PFT instructions mailed to patient along with date and time of appointment. Nothing else needed at this time. Rhonda J Cobb

## 2019-03-10 NOTE — Telephone Encounter (Signed)
LMOVM for Pamala Hurry to return call to cancel and R/S PFT. Rhonda J Cobb

## 2019-03-11 ENCOUNTER — Ambulatory Visit: Payer: Medicaid Other

## 2019-03-18 ENCOUNTER — Other Ambulatory Visit: Payer: Self-pay | Admitting: Neurology

## 2019-03-18 ENCOUNTER — Other Ambulatory Visit: Payer: Self-pay | Admitting: Physician Assistant

## 2019-03-18 DIAGNOSIS — J432 Centrilobular emphysema: Secondary | ICD-10-CM

## 2019-03-18 DIAGNOSIS — M5412 Radiculopathy, cervical region: Secondary | ICD-10-CM | POA: Diagnosis not present

## 2019-03-18 DIAGNOSIS — R110 Nausea: Secondary | ICD-10-CM

## 2019-03-18 DIAGNOSIS — I10 Essential (primary) hypertension: Secondary | ICD-10-CM

## 2019-03-24 ENCOUNTER — Other Ambulatory Visit: Payer: Self-pay | Admitting: Neurological Surgery

## 2019-03-24 DIAGNOSIS — M5412 Radiculopathy, cervical region: Secondary | ICD-10-CM

## 2019-03-31 ENCOUNTER — Ambulatory Visit
Admission: RE | Admit: 2019-03-31 | Discharge: 2019-03-31 | Disposition: A | Discharge status: Home / Self Care | Payer: Medicaid Other | Source: Ambulatory Visit | Attending: Neurological Surgery | Admitting: Neurological Surgery

## 2019-03-31 ENCOUNTER — Other Ambulatory Visit: Payer: Self-pay

## 2019-03-31 DIAGNOSIS — M5412 Radiculopathy, cervical region: Secondary | ICD-10-CM | POA: Diagnosis not present

## 2019-03-31 DIAGNOSIS — M4322 Fusion of spine, cervical region: Secondary | ICD-10-CM | POA: Diagnosis not present

## 2019-04-13 DIAGNOSIS — G894 Chronic pain syndrome: Secondary | ICD-10-CM | POA: Diagnosis not present

## 2019-04-13 DIAGNOSIS — M79621 Pain in right upper arm: Secondary | ICD-10-CM | POA: Diagnosis not present

## 2019-04-13 DIAGNOSIS — M79622 Pain in left upper arm: Secondary | ICD-10-CM | POA: Diagnosis not present

## 2019-04-13 DIAGNOSIS — M79662 Pain in left lower leg: Secondary | ICD-10-CM | POA: Diagnosis not present

## 2019-04-16 DIAGNOSIS — M25559 Pain in unspecified hip: Secondary | ICD-10-CM | POA: Diagnosis not present

## 2019-04-21 ENCOUNTER — Ambulatory Visit: Payer: Medicaid Other | Admitting: Physician Assistant

## 2019-04-23 DIAGNOSIS — M25552 Pain in left hip: Secondary | ICD-10-CM | POA: Diagnosis not present

## 2019-04-24 ENCOUNTER — Other Ambulatory Visit: Payer: Self-pay | Admitting: Physician Assistant

## 2019-04-24 DIAGNOSIS — R110 Nausea: Secondary | ICD-10-CM

## 2019-04-27 NOTE — Telephone Encounter (Signed)
Neurology has a note on the prescription to call them for an appt. Please advise.

## 2019-05-11 DIAGNOSIS — M79662 Pain in left lower leg: Secondary | ICD-10-CM | POA: Diagnosis not present

## 2019-05-11 DIAGNOSIS — M79621 Pain in right upper arm: Secondary | ICD-10-CM | POA: Diagnosis not present

## 2019-05-11 DIAGNOSIS — M79622 Pain in left upper arm: Secondary | ICD-10-CM | POA: Diagnosis not present

## 2019-05-11 DIAGNOSIS — G894 Chronic pain syndrome: Secondary | ICD-10-CM | POA: Diagnosis not present

## 2019-05-14 ENCOUNTER — Telehealth: Payer: Self-pay | Admitting: Pulmonary Disease

## 2019-05-14 NOTE — Telephone Encounter (Signed)
Left message to relay date/time of covid test. 05/17/2019 [facility]

## 2019-05-14 NOTE — Telephone Encounter (Signed)
Spoke to pt, who stated that she would to reschedule PFT.  Rhonda, please advise. Thanks

## 2019-05-17 ENCOUNTER — Other Ambulatory Visit: Payer: Medicaid Other

## 2019-05-17 NOTE — Telephone Encounter (Signed)
Called and canceled PFT for 05/18/2019. Pt was scheduled for PFT in June, July and November, this will be the 4th PFT that has been schedule for patient. Rhonda J Cobb LMOVM for pt to return call to R/S PFT. Rhonda J Cobb

## 2019-05-18 ENCOUNTER — Ambulatory Visit: Payer: Medicaid Other

## 2019-05-19 ENCOUNTER — Ambulatory Visit: Payer: Medicaid Other | Admitting: Pulmonary Disease

## 2019-05-19 NOTE — Telephone Encounter (Signed)
LMOVM for pt to return my call to R/S PFT. Rhonda J Cobb

## 2019-05-19 NOTE — Telephone Encounter (Signed)
PFT has been R/S for Thurs 07/08/2019 to arrive at 5:15 pm at [facility]. Pt is aware that COVID test will be on Wed 07/07/2019 at the [facility] between 8:00 am - 11:00 am.  Pt is aware that this procedure can not be R/S again per Cardiopulmonary. Nothing else needed at this time. Rhonda J Cobb

## 2019-05-25 DIAGNOSIS — M79621 Pain in right upper arm: Secondary | ICD-10-CM | POA: Diagnosis not present

## 2019-05-25 DIAGNOSIS — G894 Chronic pain syndrome: Secondary | ICD-10-CM | POA: Diagnosis not present

## 2019-05-25 DIAGNOSIS — M79662 Pain in left lower leg: Secondary | ICD-10-CM | POA: Diagnosis not present

## 2019-05-25 DIAGNOSIS — M79622 Pain in left upper arm: Secondary | ICD-10-CM | POA: Diagnosis not present

## 2019-06-03 ENCOUNTER — Other Ambulatory Visit: Payer: Self-pay | Admitting: Physician Assistant

## 2019-06-03 DIAGNOSIS — R110 Nausea: Secondary | ICD-10-CM

## 2019-06-22 DIAGNOSIS — M545 Low back pain: Secondary | ICD-10-CM | POA: Diagnosis not present

## 2019-06-22 DIAGNOSIS — M542 Cervicalgia: Secondary | ICD-10-CM | POA: Diagnosis not present

## 2019-06-22 DIAGNOSIS — M25511 Pain in right shoulder: Secondary | ICD-10-CM | POA: Diagnosis not present

## 2019-06-22 DIAGNOSIS — M25512 Pain in left shoulder: Secondary | ICD-10-CM | POA: Diagnosis not present

## 2019-06-23 DIAGNOSIS — Z6832 Body mass index (BMI) 32.0-32.9, adult: Secondary | ICD-10-CM | POA: Diagnosis not present

## 2019-06-23 DIAGNOSIS — G5600 Carpal tunnel syndrome, unspecified upper limb: Secondary | ICD-10-CM | POA: Diagnosis not present

## 2019-06-23 DIAGNOSIS — I10 Essential (primary) hypertension: Secondary | ICD-10-CM | POA: Diagnosis not present

## 2019-06-23 DIAGNOSIS — M542 Cervicalgia: Secondary | ICD-10-CM | POA: Diagnosis not present

## 2019-06-26 ENCOUNTER — Other Ambulatory Visit: Payer: Self-pay | Admitting: Physician Assistant

## 2019-06-26 DIAGNOSIS — I10 Essential (primary) hypertension: Secondary | ICD-10-CM

## 2019-06-27 NOTE — Telephone Encounter (Signed)
Requested medication (s) are due for refill today: yes  Requested medication (s) are on the active medication list: yes  Last refill:  03/18/2019  Future visit scheduled: no  Notes to clinic:  LOV-10/29/2018 Overdue for office visit    Requested Prescriptions  Pending Prescriptions Disp Refills   metoprolol tartrate (LOPRESSOR) 25 MG tablet [Pharmacy Med Name: METOPROLOL TARTRATE 25 MG TAB] 180 tablet 0    Sig: TAKE 1 TABLET BY MOUTH TWICE A DAY      Cardiovascular:  Beta Blockers Failed - 06/26/2019 11:25 AM      Failed - Valid encounter within last 6 months    Recent Outpatient Visits           8 months ago Hospital discharge follow-up   [facility] [name], [place]   1 year ago Essential hypertension   [place], [place], [place]   1 year ago Hyperlipidemia, unspecified hyperlipidemia type   [facility], [place], [name]   1 year ago Chronic active hepatitis [facility])   [facility] [place], Wendee Beavers, [place]   1 year ago Lumbar facet syndrome (Bilateral) (L>R)   [facility] [name], [place]       Future Appointments             In 1 week [name], MD [facility] [place]            Passed - Last BP in normal range    BP Readings from Last 1 Encounters:  01/12/19 114/62          Passed - Last Heart Rate in normal range    Pulse Readings from Last 1 Encounters:  01/12/19 95

## 2019-06-28 NOTE — Telephone Encounter (Signed)
L.O.V. was on 10/29/2018 and upcoming appointment on 07/01/2018 for CPE. Please advise.

## 2019-07-02 ENCOUNTER — Encounter: Payer: Self-pay | Admitting: Physician Assistant

## 2019-07-05 DIAGNOSIS — G5601 Carpal tunnel syndrome, right upper limb: Secondary | ICD-10-CM | POA: Diagnosis not present

## 2019-07-06 ENCOUNTER — Ambulatory Visit: Payer: Medicaid Other | Admitting: Gastroenterology

## 2019-07-06 ENCOUNTER — Encounter: Payer: Self-pay | Admitting: Gastroenterology

## 2019-07-06 NOTE — Progress Notes (Deleted)
[name] MD, MRCP(U.K) [address]  [place]  [place], [place] [address]  Main: [phone]  Fax: [phone]   Primary Care Physician: [name], [name]  Primary Gastroenterologist:  Dr. [name]   No chief complaint on file.   HPI: Sara Munoz is a 54 y.o. female   Summary of history : Sara R Dunlapwas initially seen  in 05/2016 for hepatitis CGT 3.Treatment naive.Completed treatment in 08/2017.Autoimmune screen,HIV, Hep B serology -negative in 08/2016   CT abdomen 03/17/17- increased stool burden and hepatic steatosis. Colonoscopy 08/2016 - small adenomas excised  RUQ USG 05/07/16 showed hepatic steatosis.  09/18/17 : EGD: normal appearance but bx showed mild active gastritis and negative for H pylori .  LFT's normal in 01/2018 02/25/18: H pylori breath test negative  Interval history 05/19/18 -07/06/2019  06/12/2018: No immunity against hepatitis B advised to get hepatitis B vaccine  06/12/2018: CT scan of the abdomen and pelvis with contrast no acute findings     Has been having diarrhea last 3 weeks, was constipated previously. She has 5-6 bowel movements a day . Some gas. Denies any NSAID's . Still on colace.   Has abdominal pain rt side of abdomen ongoing since 01/2018 . Almost stopped smoking.     Current Outpatient Medications  Medication Sig Dispense Refill  . metoprolol tartrate (LOPRESSOR) 25 MG tablet TAKE 1 TABLET BY MOUTH TWICE A DAY 180 tablet 0  . albuterol (PROVENTIL HFA;VENTOLIN HFA) 108 (90 Base) MCG/ACT inhaler Inhale 2 puffs into the lungs every 6 (six) hours as needed for wheezing or shortness of breath. 1 Inhaler 2  . albuterol (VENTOLIN HFA) 108 (90 Base) MCG/ACT inhaler Inhale 2 puffs into the lungs every 4 (four) hours as needed for wheezing or shortness of breath. 6.7 g 0  . atorvastatin (LIPITOR) 20 MG tablet Take 1 tablet (20 mg total) by mouth daily. 90 tablet 3  . budesonide-formoterol (SYMBICORT)  160-4.5 MCG/ACT inhaler Inhale 2 puffs into the lungs 2 (two) times daily. 1 Inhaler 0  . docusate sodium (COLACE) 100 MG capsule Take 200 mg by mouth 2 (two) times daily as needed for mild constipation.    . DULoxetine (CYMBALTA) 60 MG capsule Take 1 capsule (60 mg total) by mouth daily. Please call [phone] to schedule appt or may request future refills from PCP. 30 capsule 2  . gabapentin (NEURONTIN) 600 MG tablet Take 600 mg by mouth 3 (three) times daily.    Marland Kitchen ipratropium-albuterol (DUONEB) 0.5-2.5 (3) MG/3ML SOLN Take 3 mLs by nebulization every 6 (six) hours as needed. (Patient taking differently: Take 3 mLs by nebulization every 6 (six) hours as needed (asthma). ) 360 mL 5  . lisinopril (PRINIVIL,ZESTRIL) 40 MG tablet Take 1 tablet (40 mg total) by mouth daily. 90 tablet 1  . Naloxone HCl (NARCAN IJ) Inject as directed as needed.    . nitroGLYCERIN (NITROSTAT) 0.4 MG SL tablet Place 1 tablet (0.4 mg total) under the tongue every 5 (five) minutes x 3 doses as needed for chest pain. 30 tablet 2  . omeprazole (PRILOSEC) 40 MG capsule Take 1 capsule (40 mg total) by mouth daily. 90 capsule 3  . ondansetron (ZOFRAN-ODT) 8 MG disintegrating tablet TAKE 1 TABLET (8 MG TOTAL) BY MOUTH 2 (TWO) TIMES DAILY. 30-DAY RX. PLEASE CALL [id number] TO SCHEDULE APPT FOR CONTINUED REFILLS. 20 tablet 0  . Oxycodone HCl 10 MG TABS Take 10 mg by mouth 4 (four) times daily.     Marland Kitchen  rizatriptan (MAXALT-MLT) 10 MG disintegrating tablet Take 1 tab at onset of migraine.  May repeat in 2 hrs, if needed.  Max dose: 2 tabs/day. This is a 30 day prescription.  Please call [phone] to schedule appt or may request future refills from PCP. 9 tablet 2  . Tiotropium Bromide Monohydrate (SPIRIVA RESPIMAT) 2.5 MCG/ACT AERS Inhale 1 puff into the lungs daily. 4 g 3  . tiZANidine (ZANAFLEX) 4 MG tablet Take 4 mg by mouth 3 (three) times daily.      No current facility-administered medications for this visit.    Allergies as of  07/06/2019 - Review Complete 01/11/2019  Allergen Reaction Noted  . Flexeril [cyclobenzaprine] Hives and Swelling 11/13/2013  . Levofloxacin Hives and Swelling 02/27/2012  . Ketorolac  10/22/2010  . Phenergan [promethazine hcl] Other (See Comments) 02/21/2017  . Toradol [ketorolac tromethamine] Swelling and Other (See Comments) 01/17/2015  . Tramadol Hives, Swelling, and Other (See Comments) 01/17/2015  . Zoloft [sertraline hcl] Swelling 05/31/2015    ROS:  General: Negative for anorexia, weight loss, fever, chills, fatigue, weakness. ENT: Negative for hoarseness, difficulty swallowing , nasal congestion. CV: Negative for chest pain, angina, palpitations, dyspnea on exertion, peripheral edema.  Respiratory: Negative for dyspnea at rest, dyspnea on exertion, cough, sputum, wheezing.  GI: See history of present illness. GU:  Negative for dysuria, hematuria, urinary incontinence, urinary frequency, nocturnal urination.  Endo: Negative for unusual weight change.    Physical Examination:   LMP 12/16/2017 (Approximate)   General: Well-nourished, well-developed in no acute distress.  Eyes: No icterus. Conjunctivae pink. Mouth: Oropharyngeal mucosa moist and pink , no lesions erythema or exudate. Lungs: Clear to auscultation bilaterally. Non-labored. Heart: Regular rate and rhythm, no murmurs rubs or gallops.  Abdomen: Bowel sounds are normal, nontender, nondistended, no hepatosplenomegaly or masses, no abdominal bruits or hernia , no rebound or guarding.   Extremities: No lower extremity edema. No clubbing or deformities. Neuro: Alert and oriented x 3.  Grossly intact. Skin: Warm and dry, no jaundice.   Psych: Alert and cooperative, normal mood and affect.   Imaging Studies: No results found.  Assessment and Plan:   Sara Munoz is a 54 y.o. y/o female here to follow upfor hepatitis C. GT 3 No biochemical or radiological evidence of cirrhosis. .Completed treatment attained  SVR(cured). Today here to follow up for abdominal pain: presently persists and has diarrhea: previously had constipation    Plan  1.Check Hep B surface antibody  2.  CT scan of abdomen to evaluate abdominal pain  3. Stool tests to rule out infection  4. If above is negative will treat for IBS-D with xifaxan.    Dr [name]  MD,MRCP [facility]) Follow up in ***

## 2019-07-07 ENCOUNTER — Other Ambulatory Visit: Payer: Medicaid Other

## 2019-07-08 ENCOUNTER — Ambulatory Visit: Payer: Medicaid Other

## 2019-07-16 DIAGNOSIS — M542 Cervicalgia: Secondary | ICD-10-CM | POA: Diagnosis not present

## 2019-07-16 DIAGNOSIS — M25512 Pain in left shoulder: Secondary | ICD-10-CM | POA: Diagnosis not present

## 2019-07-16 DIAGNOSIS — M545 Low back pain: Secondary | ICD-10-CM | POA: Diagnosis not present

## 2019-07-16 DIAGNOSIS — M25511 Pain in right shoulder: Secondary | ICD-10-CM | POA: Diagnosis not present

## 2019-07-20 ENCOUNTER — Other Ambulatory Visit: Payer: Self-pay | Admitting: Neurology

## 2019-07-23 ENCOUNTER — Telehealth: Payer: Self-pay

## 2019-07-23 DIAGNOSIS — M5416 Radiculopathy, lumbar region: Secondary | ICD-10-CM

## 2019-07-23 DIAGNOSIS — G8929 Other chronic pain: Secondary | ICD-10-CM

## 2019-07-23 DIAGNOSIS — M5412 Radiculopathy, cervical region: Secondary | ICD-10-CM

## 2019-07-23 NOTE — Telephone Encounter (Signed)
Copied from [place] [id number]. Topic: General - Inquiry >> Jul 23, 2019  4:47 PM Mathis Bud wrote: Reason for CRM: Clotilde Dieter called from [facility] ortho regarding patients referral and the NPI number since patient has medicare.  Patient is coming in next week. Clotilde Dieter is wanting a fax to be sent.  [phone] Call back [phone]

## 2019-07-26 NOTE — Telephone Encounter (Signed)
Spoke with the reciptionist at [employer] and they are wanting to know why patient is been referred to [facility] and how many days. I advised her that I did not seen in patients chart that you had place a referral for the patient but I will send a message to [facility]. Think patient may have made the appointment herself. Please advise

## 2019-07-26 NOTE — Telephone Encounter (Signed)
Referral placed for ortho insurance purposes.

## 2019-07-27 ENCOUNTER — Other Ambulatory Visit: Payer: Self-pay

## 2019-07-27 ENCOUNTER — Ambulatory Visit: Payer: Medicaid Other | Admitting: Gastroenterology

## 2019-07-27 ENCOUNTER — Encounter: Payer: Self-pay | Admitting: Gastroenterology

## 2019-07-27 ENCOUNTER — Telehealth: Payer: Self-pay

## 2019-07-27 VITALS — BP 156/85 | HR 72 | Temp 98.3°F | Ht 64.0 in | Wt 190.4 lb

## 2019-07-27 DIAGNOSIS — Z791 Long term (current) use of non-steroidal anti-inflammatories (NSAID): Secondary | ICD-10-CM | POA: Diagnosis not present

## 2019-07-27 DIAGNOSIS — K921 Melena: Secondary | ICD-10-CM

## 2019-07-27 LAB — COMPREHENSIVE METABOLIC PANEL
ALT: 14 IU/L (ref 0–32)
AST: 20 IU/L (ref 0–40)
Albumin/Globulin Ratio: 1.6 (ref 1.2–2.2)
Albumin: 4.1 g/dL (ref 3.8–4.9)
Alkaline Phosphatase: 91 IU/L (ref 39–117)
BUN/Creatinine Ratio: 11 (ref 9–23)
BUN: 10 mg/dL (ref 6–24)
Bilirubin Total: <0.2 mg/dL (ref 0.0–1.2)
CO2: 23 mmol/L (ref 20–29)
Calcium: 9.4 mg/dL (ref 8.7–10.2)
Chloride: 101 mmol/L (ref 96–106)
Creatinine, Ser: 0.91 mg/dL (ref 0.57–1.00)
GFR calc Af Amer: 84 mL/min/{1.73_m2} (ref >59)
GFR calc non Af Amer: 73 mL/min/{1.73_m2} (ref >59)
Globulin, Total: 2.6 g/dL (ref 1.5–4.5)
Glucose: 117 mg/dL — ABNORMAL HIGH (ref 65–99)
Potassium: 4.2 mmol/L (ref 3.5–5.2)
Sodium: 138 mmol/L (ref 134–144)
Total Protein: 6.7 g/dL (ref 6.0–8.5)

## 2019-07-27 LAB — CBC
Hematocrit: 43.2 % (ref 34.0–46.6)
Hemoglobin: 14.1 g/dL (ref 11.1–15.9)
MCH: 28.3 pg (ref 26.6–33.0)
MCHC: 32.6 g/dL (ref 31.5–35.7)
MCV: 87 fL (ref 79–97)
Platelets: 263 10*3/uL (ref 150–450)
RBC: 4.98 x10E6/uL (ref 3.77–5.28)
RDW: 15.8 % — ABNORMAL HIGH (ref 11.7–15.4)
WBC: 9.1 10*3/uL (ref 3.4–10.8)

## 2019-07-27 MED ORDER — OMEPRAZOLE 40 MG PO CPDR: 40.0000 mg | DELAYED_RELEASE_CAPSULE | Freq: Two times a day (BID) | ORAL | 3 refills | Status: DC

## 2019-07-27 NOTE — Progress Notes (Addendum)
[name] MD, MRCP(U.K) [address]  [place]  [place], [place] [address]  Main: [phone]  Fax: [phone]   Primary Care Physician: [name], [name]  Primary Gastroenterologist:  Dr. [name]    Follow-up for diarrhea  HPI: Sara Munoz is a 54 y.o. female   Summary of history : Sara Munoz initially referred back in 05/2016 for hepatitis CGT 3.Treatment naive.Completed treatment in 08/2017.Autoimmune screen,HIV, Hep B serology -negative in 08/2016   CT abdomen 03/17/17- increased stool burden and hepatic steatosis. Colonoscopy 08/2016 - small adenomas excised  RUQ USG 05/07/16 showed hepatic steatosis.   Labs 12/2016- Albumin 4.1,Hb 14.1 and platelet count 213 09/18/17 : EGD: normal appearance but bx showed mild active gastritis and negative for H pylori .  LFT's normal in 01/2018  02/25/18: H pylori breath test negative  Interval history 05/19/18-07/27/2019  Last seen over a year back. 10/19/2018: CT scan of the abdomenPerinephric fat stranding was seen She says that she is here today to see me for abdominal pain, epigastric region ongoing for a few weeks all day long.  Having black-colored stools over the last few days.  Been taking BC powder "for years" for headaches.  Not taking any PPI.  Bowel movements every day sometimes hard sometimes soft.  Not taking any medication for her constipation which she was diagnosed in the past.      Current Outpatient Medications  Medication Sig Dispense Refill  . metoprolol tartrate (LOPRESSOR) 25 MG tablet TAKE 1 TABLET BY MOUTH TWICE A DAY 180 tablet 0  . albuterol (PROVENTIL HFA;VENTOLIN HFA) 108 (90 Base) MCG/ACT inhaler Inhale 2 puffs into the lungs every 6 (six) hours as needed for wheezing or shortness of breath. 1 Inhaler 2  . albuterol (VENTOLIN HFA) 108 (90 Base) MCG/ACT inhaler Inhale 2 puffs into the lungs every 4 (four) hours as needed for wheezing or shortness of breath. 6.7 g 0    . atorvastatin (LIPITOR) 20 MG tablet Take 1 tablet (20 mg total) by mouth daily. 90 tablet 3  . budesonide-formoterol (SYMBICORT) 160-4.5 MCG/ACT inhaler Inhale 2 puffs into the lungs 2 (two) times daily. 1 Inhaler 0  . docusate sodium (COLACE) 100 MG capsule Take 200 mg by mouth 2 (two) times daily as needed for mild constipation.    . DULoxetine (CYMBALTA) 60 MG capsule Take 1 capsule (60 mg total) by mouth daily. Please call [phone] to schedule appt or may request future refills from PCP. 30 capsule 2  . gabapentin (NEURONTIN) 600 MG tablet Take 600 mg by mouth 3 (three) times daily.    Marland Kitchen ipratropium-albuterol (DUONEB) 0.5-2.5 (3) MG/3ML SOLN Take 3 mLs by nebulization every 6 (six) hours as needed. (Patient taking differently: Take 3 mLs by nebulization every 6 (six) hours as needed (asthma). ) 360 mL 5  . lisinopril (PRINIVIL,ZESTRIL) 40 MG tablet Take 1 tablet (40 mg total) by mouth daily. 90 tablet 1  . Naloxone HCl (NARCAN IJ) Inject as directed as needed.    . nitroGLYCERIN (NITROSTAT) 0.4 MG SL tablet Place 1 tablet (0.4 mg total) under the tongue every 5 (five) minutes x 3 doses as needed for chest pain. 30 tablet 2  . omeprazole (PRILOSEC) 40 MG capsule Take 1 capsule (40 mg total) by mouth daily. 90 capsule 3  . ondansetron (ZOFRAN-ODT) 8 MG disintegrating tablet TAKE 1 TABLET (8 MG TOTAL) BY MOUTH 2 (TWO) TIMES DAILY. 30-DAY RX. PLEASE CALL [id number] TO SCHEDULE APPT FOR CONTINUED REFILLS.  20 tablet 0  . Oxycodone HCl 10 MG TABS Take 10 mg by mouth 4 (four) times daily.     . rizatriptan (MAXALT-MLT) 10 MG disintegrating tablet Take 1 tab at onset of migraine.  May repeat in 2 hrs, if needed.  Max dose: 2 tabs/day. This is a 30 day prescription.  Please call [phone] to schedule appt or may request future refills from PCP. 9 tablet 2  . Tiotropium Bromide Monohydrate (SPIRIVA RESPIMAT) 2.5 MCG/ACT AERS Inhale 1 puff into the lungs daily. 4 g 3  . tiZANidine (ZANAFLEX) 4 MG tablet Take  4 mg by mouth 3 (three) times daily.      No current facility-administered medications for this visit.    Allergies as of 07/27/2019 - Review Complete 01/11/2019  Allergen Reaction Noted  . Flexeril [cyclobenzaprine] Hives and Swelling 11/13/2013  . Levofloxacin Hives and Swelling 02/27/2012  . Ketorolac  10/22/2010  . Phenergan [promethazine hcl] Other (See Comments) 02/21/2017  . Toradol [ketorolac tromethamine] Swelling and Other (See Comments) 01/17/2015  . Tramadol Hives, Swelling, and Other (See Comments) 01/17/2015  . Zoloft [sertraline hcl] Swelling 05/31/2015    ROS:  General: Negative for anorexia, weight loss, fever, chills, fatigue, weakness. ENT: Negative for hoarseness, difficulty swallowing , nasal congestion. CV: Negative for chest pain, angina, palpitations, dyspnea on exertion, peripheral edema.  Respiratory: Negative for dyspnea at rest, dyspnea on exertion, cough, sputum, wheezing.  GI: See history of present illness. GU:  Negative for dysuria, hematuria, urinary incontinence, urinary frequency, nocturnal urination.  Endo: Negative for unusual weight change.    Physical Examination:   LMP 12/16/2017 (Approximate)   General: Well-nourished, well-developed in no acute distress.  Eyes: No icterus. Conjunctivae pink. Lungs: Clear to auscultation bilaterally. Non-labored. Heart: Regular rate and rhythm, no murmurs rubs or gallops.  Abdomen: Bowel sounds are normal, mild epigastric tenderness nondistended, no hepatosplenomegaly or masses, no abdominal bruits or hernia , no rebound or guarding.   Extremities: No lower extremity edema. No clubbing or deformities. Neuro: Alert and oriented x 3.  Grossly intact. Skin: Warm and dry, no jaundice.   Psych: Alert and cooperative, normal mood and affect.   Imaging Studies: No results found.  Assessment and Plan:   Sara Munoz is a 54 y.o. y/o female here to follow upfor hepatitis C. GT 3 No biochemical or  radiological evidence of cirrhosis. .Completed treatment attained SVR(cured). Today here to follow up for abdominal pain ongoing for a long time but black-colored stools last few days.  Long-term usage of NSAIDs.  Very concerning for a peptic ulcer related to long-term NSAID usage.  Not on any PPI.  Also suffers from long-term constipation but not taking any medications for the same.  Plan  1. Check CBC, CMP stat 2.  Commence on Prilosec 40 mg twice daily 3.  Stop all NSAID use 4.  Endoscopy next available any available physician at our practice  I have discussed alternative options, risks & benefits,  which include, but are not limited to, bleeding, infection, perforation,respiratory complication & drug reaction.  The patient agrees with this plan & written consent will be obtained.    Dr [name]  MD,MRCP [facility]) Follow up in 4 weeks

## 2019-07-27 NOTE — Telephone Encounter (Signed)
Called patient and patient verbalized understanding  ?

## 2019-07-27 NOTE — Telephone Encounter (Signed)
-----   Message from [name], MD sent at 07/27/2019  2:00 PM EST ----- Inform hemoglobin is normal at 14.1 g.  Does not seem to be bleeding as per the results we have at this point of time.

## 2019-07-28 ENCOUNTER — Telehealth: Payer: Self-pay

## 2019-07-28 NOTE — Telephone Encounter (Signed)
Set up an appointment on 08/05/2019 with DG. Endo was scheduled for 2/8 and made pt aware that our first available appt was 2/11 and she stated that was fine to put her down and she would call [facility] to reschedule her endo.

## 2019-07-28 NOTE — Telephone Encounter (Signed)
We received a letter from [place] on pt needing preop clearance. I called to schedule an appointment for pt as she has not been seen since 07/17/2018.  [id number]

## 2019-07-29 ENCOUNTER — Other Ambulatory Visit: Admission: RE | Admit: 2019-07-29 | Payer: Medicaid Other | Source: Ambulatory Visit

## 2019-07-29 ENCOUNTER — Telehealth: Payer: Self-pay

## 2019-07-29 NOTE — Telephone Encounter (Signed)
Pt called to inform us that she has to follow up with her pulmonologist before they can complete the pulmonary clearance for the upper endoscopy procedure. Pt states the earliest appointment [facility] could offer is 08-05-19 therefore, we would have to reschedule the procedure. Pt agreed to reschedule procedure to 08-10-19.

## 2019-07-30 ENCOUNTER — Ambulatory Visit: Payer: Medicaid Other | Admitting: Physician Assistant

## 2019-07-30 ENCOUNTER — Ambulatory Visit (INDEPENDENT_AMBULATORY_CARE_PROVIDER_SITE_OTHER): Payer: Medicaid Other | Admitting: Physician Assistant

## 2019-07-30 DIAGNOSIS — R110 Nausea: Secondary | ICD-10-CM

## 2019-07-30 DIAGNOSIS — E7800 Pure hypercholesterolemia, unspecified: Secondary | ICD-10-CM

## 2019-07-30 DIAGNOSIS — I10 Essential (primary) hypertension: Secondary | ICD-10-CM

## 2019-07-30 DIAGNOSIS — Z1231 Encounter for screening mammogram for malignant neoplasm of breast: Secondary | ICD-10-CM | POA: Diagnosis not present

## 2019-07-30 DIAGNOSIS — J449 Chronic obstructive pulmonary disease, unspecified: Secondary | ICD-10-CM

## 2019-07-30 MED ORDER — ONDANSETRON 8 MG PO TBDP: 8.0000 mg | ORAL_TABLET | Freq: Two times a day (BID) | ORAL | 0 refills | Status: DC

## 2019-07-30 NOTE — Progress Notes (Signed)
Patient: Sara Munoz Female    DOB: 1965-09-14   54 y.o.   MRN: [id number] Visit Date: 08/03/2019  Today's Provider: [name], [name]   Chief Complaint  Patient presents with  . COPD  . Hypertension  . Hyperlipidemia   Subjective:    Virtual Visit via Telephone Note  I connected with Sara Munoz on 08/03/19 at  3:00 PM EST by telephone and verified that I am speaking with the correct person using two identifiers.  Location: Patient: Home Provider: Office   I discussed the limitations, risks, security and privacy concerns of performing an evaluation and management service by telephone and the availability of in person appointments. I also discussed with the patient that there may be a patient responsible charge related to this service. The patient expressed understanding and agreed to proceed.  HPI Patient presents today for medication refill. Patient states that she needs her Ondansetron refilled and another medication but does not remember the name of the other medication.  COPD: Currently taking symbicort and albuterol PRN. Continues smoking.   HTN: Lisinopril 40 mg daily, metoprolol tartrate 25 mg BID.   BP Readings from Last 8 Encounters:  07/27/19 (!) 156/85  01/12/19 114/62  01/07/19 (!) 159/80  12/24/18 (!) 150/99  12/01/18 (!) 168/98  10/29/18 110/70  10/22/18 118/60  07/17/18 116/80    HLD: lipitor 20 mg QHS.   Lipid Panel     Component Value Date/Time   CHOL 251 (H) 02/20/2018 1230   TRIG 220 (H) 02/20/2018 1230   HDL 46 02/20/2018 1230   CHOLHDL 5.5 (H) 02/20/2018 1230   CHOLHDL 5.3 08/02/2015 0606   VLDL 24 08/02/2015 0606   LDLCALC 161 (H) 02/20/2018 1230   LABVLDL 44 (H) 02/20/2018 1230     Allergies  Allergen Reactions  . Flexeril [Cyclobenzaprine] Hives and Swelling    Facial/lip swelling     . Levofloxacin Hives and Swelling  . Ketorolac   . Phenergan [Promethazine Hcl] Other (See Comments)    Agitation.   .  Toradol [Ketorolac Tromethamine] Swelling and Other (See Comments)    Reaction:  Facial/tongue swelling  Reaction:  Facial/tongue swelling   . Tramadol Hives, Swelling and Other (See Comments)    Reaction:  Lip swelling  Reaction:  Lip swelling   . Zoloft [Sertraline Hcl] Swelling    Tongue swelling        Current Outpatient Medications:  .  albuterol (PROVENTIL HFA;VENTOLIN HFA) 108 (90 Base) MCG/ACT inhaler, Inhale 2 puffs into the lungs every 6 (six) hours as needed for wheezing or shortness of breath., Disp: 1 Inhaler, Rfl: 2 .  albuterol (VENTOLIN HFA) 108 (90 Base) MCG/ACT inhaler, Inhale 2 puffs into the lungs every 4 (four) hours as needed for wheezing or shortness of breath., Disp: 6.7 g, Rfl: 0 .  atorvastatin (LIPITOR) 20 MG tablet, Take 1 tablet (20 mg total) by mouth daily., Disp: 90 tablet, Rfl: 3 .  budesonide-formoterol (SYMBICORT) 160-4.5 MCG/ACT inhaler, Inhale 2 puffs into the lungs 2 (two) times daily., Disp: 1 Inhaler, Rfl: 0 .  docusate sodium (COLACE) 100 MG capsule, Take 200 mg by mouth 2 (two) times daily as needed for mild constipation., Disp: , Rfl:  .  DULoxetine (CYMBALTA) 60 MG capsule, Take 1 capsule (60 mg total) by mouth daily. Please call [phone] to schedule appt or may request future refills from PCP., Disp: 30 capsule, Rfl: 2 .  gabapentin (NEURONTIN) 600 MG tablet, Take  600 mg by mouth 3 (three) times daily., Disp: , Rfl:  .  ipratropium-albuterol (DUONEB) 0.5-2.5 (3) MG/3ML SOLN, Take 3 mLs by nebulization every 6 (six) hours as needed. (Patient taking differently: Take 3 mLs by nebulization every 6 (six) hours as needed (asthma). ), Disp: 360 mL, Rfl: 5 .  lisinopril (PRINIVIL,ZESTRIL) 40 MG tablet, Take 1 tablet (40 mg total) by mouth daily., Disp: 90 tablet, Rfl: 1 .  metoprolol tartrate (LOPRESSOR) 25 MG tablet, TAKE 1 TABLET BY MOUTH TWICE A DAY, Disp: 180 tablet, Rfl: 0 .  Naloxone HCl (NARCAN IJ), Inject as directed as needed., Disp: , Rfl:  .   nitroGLYCERIN (NITROSTAT) 0.4 MG SL tablet, Place 1 tablet (0.4 mg total) under the tongue every 5 (five) minutes x 3 doses as needed for chest pain., Disp: 30 tablet, Rfl: 2 .  omeprazole (PRILOSEC) 40 MG capsule, Take 1 capsule (40 mg total) by mouth 2 (two) times daily., Disp: 90 capsule, Rfl: 3 .  Oxycodone HCl 10 MG TABS, Take 10 mg by mouth 4 (four) times daily. , Disp: , Rfl:  .  Tiotropium Bromide Monohydrate (SPIRIVA RESPIMAT) 2.5 MCG/ACT AERS, Inhale 1 puff into the lungs daily., Disp: 4 g, Rfl: 3 .  tiZANidine (ZANAFLEX) 4 MG tablet, Take 4 mg by mouth 3 (three) times daily. , Disp: , Rfl:  .  ondansetron (ZOFRAN-ODT) 8 MG disintegrating tablet, Take 1 tablet (8 mg total) by mouth 2 (two) times daily. 30-day rx. Please call [phone] to schedule appt for continued refills., Disp: 20 tablet, Rfl: 0 .  rizatriptan (MAXALT-MLT) 10 MG disintegrating tablet, Take 1 tab at onset of migraine.  May repeat in 2 hrs, if needed.  Max dose: 2 tabs/day. This is a 30 day prescription.  Please call [phone] to schedule appt or may request future refills from PCP. (Patient not taking: Reported on 07/27/2019), Disp: 9 tablet, Rfl: 2  Review of Systems  Social History   Tobacco Use  . Smoking status: Current Every Day Smoker    Packs/day: 0.50    Years: 35.00    Pack years: 17.50    Types: Cigarettes  . Smokeless tobacco: Never Used  Substance Use Topics  . Alcohol use: Yes    Alcohol/week: 0.0 standard drinks      Objective:   LMP 12/16/2017 (Approximate)  There were no vitals filed for this visit.There is no height or weight on file to calculate BMI.   Physical Exam   No results found for any visits on 07/30/19.     Assessment & Plan    1. Hypercholesterolemia  Patient slept through her appointment earlier today. This will be counted as a no show. Continue medications. Reordered mammogram.   2. Nausea  - ondansetron (ZOFRAN-ODT) 8 MG disintegrating tablet; Take 1 tablet (8 mg  total) by mouth 2 (two) times daily. 30-day rx. Please call [phone] to schedule appt for continued refills.  Dispense: 20 tablet; Refill: 0  3. Encounter for screening mammogram for malignant neoplasm of breast  - MM Digital Screening; Future  4. Essential hypertension  Continue current medications.  5. Chronic obstructive pulmonary disease, unspecified COPD type ([place])  Continue current medications.   I discussed the assessment and treatment plan with the patient. The patient was provided an opportunity to ask questions and all were answered. The patient agreed with the plan and demonstrated an understanding of the instructions.   The patient was advised to call back or seek an in-person evaluation if the  symptoms worsen or if the condition fails to improve as anticipated.  I provided 25 minutes of non-face-to-face time during this encounter.     [name], [name]  [facility]

## 2019-07-30 NOTE — Progress Notes (Deleted)
Patient: Sara Munoz Female    DOB: [birth year]   54 y.o.   MRN: [id number] Visit Date: 07/30/2019  Today's Provider: [name], [name]   No chief complaint on file.  Subjective:     HPI  Allergies  Allergen Reactions  . Flexeril [Cyclobenzaprine] Hives and Swelling    Facial/lip swelling     . Levofloxacin Hives and Swelling  . Ketorolac   . Phenergan [Promethazine Hcl] Other (See Comments)    Agitation.   . Toradol [Ketorolac Tromethamine] Swelling and Other (See Comments)    Reaction:  Facial/tongue swelling  Reaction:  Facial/tongue swelling   . Tramadol Hives, Swelling and Other (See Comments)    Reaction:  Lip swelling  Reaction:  Lip swelling   . Zoloft [Sertraline Hcl] Swelling    Tongue swelling        Current Outpatient Medications:  .  metoprolol tartrate (LOPRESSOR) 25 MG tablet, TAKE 1 TABLET BY MOUTH TWICE A DAY, Disp: 180 tablet, Rfl: 0 .  albuterol (PROVENTIL HFA;VENTOLIN HFA) 108 (90 Base) MCG/ACT inhaler, Inhale 2 puffs into the lungs every 6 (six) hours as needed for wheezing or shortness of breath., Disp: 1 Inhaler, Rfl: 2 .  albuterol (VENTOLIN HFA) 108 (90 Base) MCG/ACT inhaler, Inhale 2 puffs into the lungs every 4 (four) hours as needed for wheezing or shortness of breath., Disp: 6.7 g, Rfl: 0 .  atorvastatin (LIPITOR) 20 MG tablet, Take 1 tablet (20 mg total) by mouth daily., Disp: 90 tablet, Rfl: 3 .  budesonide-formoterol (SYMBICORT) 160-4.5 MCG/ACT inhaler, Inhale 2 puffs into the lungs 2 (two) times daily., Disp: 1 Inhaler, Rfl: 0 .  docusate sodium (COLACE) 100 MG capsule, Take 200 mg by mouth 2 (two) times daily as needed for mild constipation., Disp: , Rfl:  .  DULoxetine (CYMBALTA) 60 MG capsule, Take 1 capsule (60 mg total) by mouth daily. Please call [phone] to schedule appt or may request future refills from PCP., Disp: 30 capsule, Rfl: 2 .  gabapentin (NEURONTIN) 600 MG tablet, Take 600 mg by mouth 3 (three) times daily.,  Disp: , Rfl:  .  ipratropium-albuterol (DUONEB) 0.5-2.5 (3) MG/3ML SOLN, Take 3 mLs by nebulization every 6 (six) hours as needed. (Patient taking differently: Take 3 mLs by nebulization every 6 (six) hours as needed (asthma). ), Disp: 360 mL, Rfl: 5 .  lisinopril (PRINIVIL,ZESTRIL) 40 MG tablet, Take 1 tablet (40 mg total) by mouth daily., Disp: 90 tablet, Rfl: 1 .  Naloxone HCl (NARCAN IJ), Inject as directed as needed., Disp: , Rfl:  .  nitroGLYCERIN (NITROSTAT) 0.4 MG SL tablet, Place 1 tablet (0.4 mg total) under the tongue every 5 (five) minutes x 3 doses as needed for chest pain., Disp: 30 tablet, Rfl: 2 .  omeprazole (PRILOSEC) 40 MG capsule, Take 1 capsule (40 mg total) by mouth 2 (two) times daily., Disp: 90 capsule, Rfl: 3 .  ondansetron (ZOFRAN-ODT) 8 MG disintegrating tablet, TAKE 1 TABLET (8 MG TOTAL) BY MOUTH 2 (TWO) TIMES DAILY. 30-DAY RX. PLEASE CALL [phone] TO SCHEDULE APPT FOR CONTINUED REFILLS., Disp: 20 tablet, Rfl: 0 .  Oxycodone HCl 10 MG TABS, Take 10 mg by mouth 4 (four) times daily. , Disp: , Rfl:  .  rizatriptan (MAXALT-MLT) 10 MG disintegrating tablet, Take 1 tab at onset of migraine.  May repeat in 2 hrs, if needed.  Max dose: 2 tabs/day. This is a 30 day prescription.  Please call [phone] to schedule  appt or may request future refills from PCP. (Patient not taking: Reported on 07/27/2019), Disp: 9 tablet, Rfl: 2 .  Tiotropium Bromide Monohydrate (SPIRIVA RESPIMAT) 2.5 MCG/ACT AERS, Inhale 1 puff into the lungs daily., Disp: 4 g, Rfl: 3 .  tiZANidine (ZANAFLEX) 4 MG tablet, Take 4 mg by mouth 3 (three) times daily. , Disp: , Rfl:   Review of Systems  Social History   Tobacco Use  . Smoking status: Current Every Day Smoker    Packs/day: 0.50    Years: 35.00    Pack years: 17.50    Types: Cigarettes  . Smokeless tobacco: Never Used  Substance Use Topics  . Alcohol use: Yes    Alcohol/week: 0.0 standard drinks      Objective:   LMP 12/16/2017 (Approximate)   There were no vitals filed for this visit.There is no height or weight on file to calculate BMI.   Physical Exam   No results found for any visits on 07/30/19.     Assessment & [place], [name]  [facility]

## 2019-08-03 DIAGNOSIS — H40023 Open angle with borderline findings, high risk, bilateral: Secondary | ICD-10-CM | POA: Diagnosis not present

## 2019-08-03 DIAGNOSIS — H02881 Meibomian gland dysfunction right upper eyelid: Secondary | ICD-10-CM | POA: Diagnosis not present

## 2019-08-03 DIAGNOSIS — H02884 Meibomian gland dysfunction left upper eyelid: Secondary | ICD-10-CM | POA: Diagnosis not present

## 2019-08-03 DIAGNOSIS — H25043 Posterior subcapsular polar age-related cataract, bilateral: Secondary | ICD-10-CM | POA: Diagnosis not present

## 2019-08-05 ENCOUNTER — Ambulatory Visit: Payer: Medicaid Other | Admitting: Pulmonary Disease

## 2019-08-05 ENCOUNTER — Other Ambulatory Visit: Payer: Self-pay

## 2019-08-05 ENCOUNTER — Encounter: Payer: Self-pay | Admitting: Pulmonary Disease

## 2019-08-05 VITALS — BP 144/82 | HR 77 | Temp 97.1°F | Ht 64.0 in | Wt 192.2 lb

## 2019-08-05 DIAGNOSIS — Z01811 Encounter for preprocedural respiratory examination: Secondary | ICD-10-CM | POA: Diagnosis not present

## 2019-08-05 DIAGNOSIS — J449 Chronic obstructive pulmonary disease, unspecified: Secondary | ICD-10-CM | POA: Diagnosis not present

## 2019-08-05 DIAGNOSIS — F17210 Nicotine dependence, cigarettes, uncomplicated: Secondary | ICD-10-CM | POA: Diagnosis not present

## 2019-08-05 MED ORDER — ALBUTEROL SULFATE (2.5 MG/3ML) 0.083% IN NEBU: 2.5000 mg | INHALATION_SOLUTION | Freq: Four times a day (QID) | RESPIRATORY_TRACT | 12 refills | Status: DC | PRN

## 2019-08-05 NOTE — Patient Instructions (Signed)
Continue your efforts to quit smoking you may call [phone] and ask for the quit smoking program [place] at [facility]  We will put you on the cue for  breathing tests  I switched your medication for the nebulizer to albuterol which will create less conflict with the Spiriva  We will see you in follow-up in 3 months time.  Call sooner should any new problems arise

## 2019-08-05 NOTE — Progress Notes (Signed)
Subjective:    Patient ID: Sara Munoz, female    DOB: Mar 27, 1966, 54 y.o.   MRN: [id number]  HPI Sara Munoz is a 54 year old current smoker (1-1/2 packs/day) who was first evaluated by Dr. Laverle Hobby on 17 July 2018 for management of COPD.  Dr. Ashby Dawes ordered PFTs at that time however the patient canceled PFTs and sequently with the COVID-19 pandemic these have been indefinitely postponed.  She has actually missed several appointments since that first visit with Dr. Ashby Dawes and presents today for routine follow-up.  Apparently also the patient is needing an EGD on 2/16 and needs preoperative "clearance".  She is referred by Dr. [name] for this issue.  The patient states that her dyspnea is at baseline.  She has not had any fevers, chills or sweats.  Cough is unchanged in character.  Mostly in the mornings clear to white sputum no hemoptysis.  She has had no orthopnea nor paroxysmal nocturnal dyspnea.  No lower extremity edema.  No chest pain.  I have reviewed her available medical records in epic as well as "care everywhere".  She states she is compliant with Symbicort 160/4.5, 2 inhalations twice a day and Spiriva Respimat 2.5 mcg, 2 inhalations daily.  She also uses albuterol as needed.  She also uses DuoNeb as rescue on rare occasion.  With regards to general anesthesia she does not report any issues with this.  In July 2020 she underwent C5-6 ACDF without complication or respiratory issues postop.  Review of records does not show admissions for exacerbations of COPD since 2018.  Unfortunately the patient continues to smoke 1-1/2 packs of cigarettes per day she has been smoking for 35 years previously used to smoke 3 packs a day.  Review of Systems A 10 point review of systems was performed and it is as noted above otherwise negative.    Objective:   Physical Exam BP (!) 144/82 (BP Location: Left Arm, Patient Position: Sitting, Cuff Size: Large)   Pulse 77    Temp (!) 97.1 F (36.2 C) (Temporal)   Ht 5\' 4"  (1.626 m)   Wt 192 lb 3.2 oz (87.2 kg)   LMP 12/16/2017 (Approximate)   SpO2 98% Comment: on ra  BMI 32.99 kg/m   GENERAL: Awake alert, no respiratory distress.  Fully ambulatory  HEAD: Normocephalic, atraumatic.  EYES: Pupils equal, round, reactive to light.  No scleral icterus.  MOUTH: Nose/mouth/throat not examined due to masking requirements for COVID 19. NECK: Supple. No thyromegaly.  Trachea midline. No JVD.  PULMONARY: Lungs with coarse breath sounds bilaterally, no other adventitious sounds.  Good air entry bilaterally, no increased work of breathing. CARDIOVASCULAR: S1 and S2. Regular rate and rhythm.  No rubs murmurs or gallops appreciated. GASTROINTESTINAL: Abdomen is nondistended. MUSCULOSKELETAL: No joint swelling, no clubbing, no edema.  NEUROLOGIC: No focal deficits noted.  Awake, alert, speech fluent. SKIN: Intact,warm,dry.  Limited exam shows no rashes. PSYCH: Mood and behavior normal.   Patient has not had PFTs on record.  Has canceled PFTs previously and due to COVID-19 restrictions has not been able to be rescheduled.    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, by clinical impression Patient appears compensated  Pulmonary function testing when available  Continue Symbicort 160/4.5, 2 inhalations twice a day  Continue Spiriva Respimat 2.5 mcg, 2 puffs once a day  Continue albuterol as needed  For nebulization use have discontinued DuoNeb due to redundancy of ipratropium with Tiotropium, switch to albuterol nebulizer  solution on a as needed basis  Follow-up in 3 months time, call sooner should any new difficulties arise  Tobacco dependence due to cigarettes  Patient was counseled regards to discontinuation of smoking  Total counseling time 3 to 5 minutes  Patient provided the number for smoking cessation program through [facility]  Preoperative pulmonary assessment  By clinical impression,  the patient is a mild to moderate risk for general anesthesia  She has not had significant difficulties in the past from the pulmonary standpoint with general anesthesia and more involved procedures  She appears to be as compensated as she is to get with her current medications  She has had no PFTs despite these having been ordered previously and best assessment possible is that by clinical impression as above  C. Derrill Kay, MD  PCCM  *This note was dictated using voice recognition software/Dragon.  Despite best efforts to proofread, errors can occur which can change the meaning.  Any change was purely unintentional.

## 2019-08-06 ENCOUNTER — Other Ambulatory Visit
Admission: RE | Admit: 2019-08-06 | Discharge: 2019-08-06 | Disposition: A | Discharge status: Home / Self Care | Payer: Medicaid Other | Source: Ambulatory Visit | Attending: Gastroenterology | Admitting: Gastroenterology

## 2019-08-06 ENCOUNTER — Telehealth: Payer: Self-pay

## 2019-08-06 ENCOUNTER — Encounter: Payer: Self-pay | Admitting: Pulmonary Disease

## 2019-08-06 DIAGNOSIS — Z20822 Contact with and (suspected) exposure to covid-19: Secondary | ICD-10-CM | POA: Insufficient documentation

## 2019-08-06 DIAGNOSIS — Z01812 Encounter for preprocedural laboratory examination: Secondary | ICD-10-CM | POA: Insufficient documentation

## 2019-08-06 DIAGNOSIS — M25532 Pain in left wrist: Secondary | ICD-10-CM | POA: Diagnosis not present

## 2019-08-06 NOTE — Telephone Encounter (Signed)
Called to schedule patient for appointment for surgical clearance, LMTCB.

## 2019-08-07 LAB — SARS CORONAVIRUS 2 (TAT 6-24 HRS): SARS Coronavirus 2: NEGATIVE

## 2019-08-09 ENCOUNTER — Encounter: Payer: Self-pay | Admitting: Gastroenterology

## 2019-08-10 ENCOUNTER — Ambulatory Visit
Admission: RE | Admit: 2019-08-10 | Discharge: 2019-08-10 | Disposition: A | Discharge status: Home / Self Care | Payer: Medicaid Other | Attending: Gastroenterology | Admitting: Gastroenterology

## 2019-08-10 ENCOUNTER — Encounter: Payer: Self-pay | Admitting: Gastroenterology

## 2019-08-10 ENCOUNTER — Ambulatory Visit: Payer: Medicaid Other | Admitting: Physician Assistant

## 2019-08-10 ENCOUNTER — Ambulatory Visit: Payer: Medicaid Other | Admitting: Anesthesiology

## 2019-08-10 ENCOUNTER — Other Ambulatory Visit: Payer: Self-pay

## 2019-08-10 ENCOUNTER — Encounter
Admission: RE | Disposition: A | Discharge status: Home / Self Care | Payer: Self-pay | Source: Home / Self Care | Attending: Gastroenterology

## 2019-08-10 DIAGNOSIS — I2510 Atherosclerotic heart disease of native coronary artery without angina pectoris: Secondary | ICD-10-CM | POA: Insufficient documentation

## 2019-08-10 DIAGNOSIS — I10 Essential (primary) hypertension: Secondary | ICD-10-CM | POA: Insufficient documentation

## 2019-08-10 DIAGNOSIS — E7800 Pure hypercholesterolemia, unspecified: Secondary | ICD-10-CM | POA: Insufficient documentation

## 2019-08-10 DIAGNOSIS — K2970 Gastritis, unspecified, without bleeding: Secondary | ICD-10-CM | POA: Diagnosis not present

## 2019-08-10 DIAGNOSIS — G4733 Obstructive sleep apnea (adult) (pediatric): Secondary | ICD-10-CM | POA: Diagnosis not present

## 2019-08-10 DIAGNOSIS — Z888 Allergy status to other drugs, medicaments and biological substances status: Secondary | ICD-10-CM | POA: Insufficient documentation

## 2019-08-10 DIAGNOSIS — I252 Old myocardial infarction: Secondary | ICD-10-CM | POA: Diagnosis not present

## 2019-08-10 DIAGNOSIS — Z955 Presence of coronary angioplasty implant and graft: Secondary | ICD-10-CM | POA: Diagnosis not present

## 2019-08-10 DIAGNOSIS — Z881 Allergy status to other antibiotic agents status: Secondary | ICD-10-CM | POA: Diagnosis not present

## 2019-08-10 DIAGNOSIS — Z8249 Family history of ischemic heart disease and other diseases of the circulatory system: Secondary | ICD-10-CM | POA: Insufficient documentation

## 2019-08-10 DIAGNOSIS — K921 Melena: Secondary | ICD-10-CM | POA: Diagnosis not present

## 2019-08-10 DIAGNOSIS — F17210 Nicotine dependence, cigarettes, uncomplicated: Secondary | ICD-10-CM | POA: Insufficient documentation

## 2019-08-10 DIAGNOSIS — K319 Disease of stomach and duodenum, unspecified: Secondary | ICD-10-CM | POA: Insufficient documentation

## 2019-08-10 DIAGNOSIS — I739 Peripheral vascular disease, unspecified: Secondary | ICD-10-CM | POA: Insufficient documentation

## 2019-08-10 DIAGNOSIS — Z79899 Other long term (current) drug therapy: Secondary | ICD-10-CM | POA: Insufficient documentation

## 2019-08-10 DIAGNOSIS — K219 Gastro-esophageal reflux disease without esophagitis: Secondary | ICD-10-CM | POA: Insufficient documentation

## 2019-08-10 DIAGNOSIS — Z791 Long term (current) use of non-steroidal anti-inflammatories (NSAID): Secondary | ICD-10-CM | POA: Diagnosis not present

## 2019-08-10 DIAGNOSIS — J449 Chronic obstructive pulmonary disease, unspecified: Secondary | ICD-10-CM | POA: Insufficient documentation

## 2019-08-10 DIAGNOSIS — Z8673 Personal history of transient ischemic attack (TIA), and cerebral infarction without residual deficits: Secondary | ICD-10-CM | POA: Diagnosis not present

## 2019-08-10 DIAGNOSIS — Z885 Allergy status to narcotic agent status: Secondary | ICD-10-CM | POA: Diagnosis not present

## 2019-08-10 DIAGNOSIS — F419 Anxiety disorder, unspecified: Secondary | ICD-10-CM | POA: Insufficient documentation

## 2019-08-10 DIAGNOSIS — G709 Myoneural disorder, unspecified: Secondary | ICD-10-CM | POA: Diagnosis not present

## 2019-08-10 DIAGNOSIS — M1990 Unspecified osteoarthritis, unspecified site: Secondary | ICD-10-CM | POA: Insufficient documentation

## 2019-08-10 DIAGNOSIS — K2960 Other gastritis without bleeding: Secondary | ICD-10-CM | POA: Diagnosis not present

## 2019-08-10 HISTORY — PX: ESOPHAGOGASTRODUODENOSCOPY (EGD) WITH PROPOFOL: SHX5813

## 2019-08-10 SURGERY — ESOPHAGOGASTRODUODENOSCOPY (EGD) WITH PROPOFOL
Anesthesia: General

## 2019-08-10 MED ORDER — LIDOCAINE HCL (PF) 2 % IJ SOLN
INTRAMUSCULAR | Status: DC | PRN
  Administered 2019-08-10: 100 mg via INTRADERMAL

## 2019-08-10 MED ORDER — SODIUM CHLORIDE 0.9 % IV SOLN: INTRAVENOUS | Status: DC

## 2019-08-10 MED ORDER — PROPOFOL 500 MG/50ML IV EMUL
INTRAVENOUS | Status: DC | PRN
  Administered 2019-08-10: 150 ug/kg/min via INTRAVENOUS

## 2019-08-10 MED ORDER — PROPOFOL 10 MG/ML IV BOLUS
INTRAVENOUS | Status: DC | PRN
  Administered 2019-08-10: 30 mg via INTRAVENOUS
  Administered 2019-08-10: 20 mg via INTRAVENOUS
  Administered 2019-08-10: 50 mg via INTRAVENOUS

## 2019-08-10 NOTE — H&P (Signed)
[name], MD [address], [place], [place], [place], [address] [address], [place], [place], [place], [address] Phone: [phone]  Fax: [phone]  Primary Care Physician:  [name], [name]   Pre-Procedure History & Physical: HPI:  Sara Munoz is a 54 y.o. female is here for an endoscopy    Past Medical History:  Diagnosis Date  . Anxiety   . Asthma   . Atypical chest pain 08/02/2015  . CAD (coronary artery disease)   . Chronic back pain   . COPD (chronic obstructive pulmonary disease) ([place])   . Coronary artery disease    a. Mild-non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/2014  . GERD (gastroesophageal reflux disease)   . Hypercholesteremia   . Hypertension   . Hypokalemia   . Hypomagnesemia 01/04/2014  . Liver disease   . MI (myocardial infarction) ([place])   . Opiate use 02/27/2016  . Osteoarthritis   . Stroke [facility])     Past Surgical History:  Procedure Laterality Date  . ANTERIOR CERVICAL DECOMP/DISCECTOMY FUSION N/A 03/30/2018   Procedure: Cervical six-seven Anterior cervical decompression/discectomy/fusion;  Surgeon: Judith Part, MD;  Location: [place];  Service: Neurosurgery;  Laterality: N/A;  . ANTERIOR CERVICAL DECOMP/DISCECTOMY FUSION N/A 01/11/2019   Procedure: Cervical Five-Six Anterior cervical discectomy fusion,  Cervical Five to Cervical Seven anterior instrumented fusion;  Surgeon: Judith Part, MD;  Location: [place];  Service: Neurosurgery;  Laterality: N/A;  Cervical Five-Six Anterior cervical discectomy fusion,  Cervical Five to Cervical Seven anterior instrumented fusion  . BACK SURGERY    . CARDIAC CATHETERIZATION Left 02/22/2016   Procedure: Left Heart Cath and Coronary Angiography;  Surgeon: Yolonda Kida, MD;  Location: [place] CV LAB;  Service: Cardiovascular;  Laterality: Left;  . COLONOSCOPY WITH PROPOFOL N/A 09/19/2016   Procedure: COLONOSCOPY WITH PROPOFOL;  Surgeon: [name], MD;  Location: [facility] ENDOSCOPY;   Service: Endoscopy;  Laterality: N/A;  . DIAGNOSTIC LAPAROSCOPY    . ESOPHAGOGASTRODUODENOSCOPY (EGD) WITH PROPOFOL N/A 09/18/2017   Procedure: ESOPHAGOGASTRODUODENOSCOPY (EGD) WITH PROPOFOL;  Surgeon: [name], MD;  Location: [facility] ENDOSCOPY;  Service: Gastroenterology;  Laterality: N/A;  . FRACTURE SURGERY    . hemorroids    . NASAL SINUS SURGERY    . ORIF FEMUR FRACTURE    . ORIF TIBIA & FIBULA FRACTURES    . OVARY SURGERY      Prior to Admission medications   Medication Sig Start Date End Date Taking? Authorizing Provider  albuterol (PROVENTIL HFA;VENTOLIN HFA) 108 (90 Base) MCG/ACT inhaler Inhale 2 puffs into the lungs every 6 (six) hours as needed for wheezing or shortness of breath. 05/26/17  Yes Carles Collet M, [name]  albuterol (PROVENTIL) (2.5 MG/3ML) 0.083% nebulizer solution Take 3 mLs (2.5 mg total) by nebulization every 6 (six) hours as needed for wheezing or shortness of breath. 08/05/19  Yes [name], MD  atorvastatin (LIPITOR) 20 MG tablet Take 1 tablet (20 mg total) by mouth daily. 07/09/18  Yes Carles Collet M, [name]  budesonide-formoterol (SYMBICORT) 160-4.5 MCG/ACT inhaler Inhale 2 puffs into the lungs 2 (two) times daily. 05/26/17  Yes Carles Collet M, [name]  docusate sodium (COLACE) 100 MG capsule Take 200 mg by mouth 2 (two) times daily as needed for mild constipation.   Yes [provider]  DULoxetine (CYMBALTA) 60 MG capsule Take 1 capsule (60 mg total) by mouth daily. Please call [phone] to schedule appt or may request future refills from PCP. 03/18/19  Yes [name], MD  gabapentin (NEURONTIN) 600 MG tablet Take 600 mg by mouth 3 (three) times daily.   Yes [provider]  lisinopril (PRINIVIL,ZESTRIL) 40 MG tablet Take 1 tablet (40 mg total) by mouth daily. 08/05/17  Yes [name], [name]  metoprolol tartrate (LOPRESSOR) 25 MG tablet TAKE 1 TABLET BY MOUTH TWICE A DAY 06/28/19  Yes Pollak, Adriana M, [name]  omeprazole (PRILOSEC) 40 MG  capsule Take 1 capsule (40 mg total) by mouth 2 (two) times daily. 07/27/19 09/24/19 Yes [name], MD  ondansetron (ZOFRAN-ODT) 8 MG disintegrating tablet Take 1 tablet (8 mg total) by mouth 2 (two) times daily. 30-day rx. Please call [phone] to schedule appt for continued refills. 07/30/19  Yes Carles Collet M, [name]  Oxycodone HCl 10 MG TABS Take 10 mg by mouth 6 (six) times daily.  06/19/18  Yes [provider]  rizatriptan (MAXALT-MLT) 10 MG disintegrating tablet Take 1 tab at onset of migraine.  May repeat in 2 hrs, if needed.  Max dose: 2 tabs/day. This is a 30 day prescription.  Please call [phone] to schedule appt or may request future refills from PCP. 03/18/19  Yes [name], MD  Tiotropium Bromide Monohydrate (SPIRIVA RESPIMAT) 2.5 MCG/ACT AERS Inhale 1 puff into the lungs daily. 03/18/19  Yes [name], [name]  tiZANidine (ZANAFLEX) 4 MG tablet Take 4 mg by mouth 3 (three) times daily.    Yes [provider]  Naloxone HCl (NARCAN IJ) Inject as directed as needed.    [provider]  nitroGLYCERIN (NITROSTAT) 0.4 MG SL tablet Place 1 tablet (0.4 mg total) under the tongue every 5 (five) minutes x 3 doses as needed for chest pain. Patient not taking: Reported on 08/10/2019 08/03/15   Demetrios Loll, MD    Allergies as of 07/27/2019 - Review Complete 07/27/2019  Allergen Reaction Noted  . Flexeril [cyclobenzaprine] Hives and Swelling 11/13/2013  . Levofloxacin Hives and Swelling 02/27/2012  . Ketorolac  10/22/2010  . Phenergan [promethazine hcl] Other (See Comments) 02/21/2017  . Toradol [ketorolac tromethamine] Swelling and Other (See Comments) 01/17/2015  . Tramadol Hives, Swelling, and Other (See Comments) 01/17/2015  . Zoloft [sertraline hcl] Swelling 05/31/2015    Family History  Problem Relation Age of Onset  . Heart disease Mother   . Lung cancer Mother   . Ovarian cancer Mother   . Healthy Brother   . Healthy Brother   . Healthy Brother   .  Diabetes Maternal Uncle   . Breast cancer Maternal Aunt     Social History   Socioeconomic History  . Marital status: Widowed    Spouse name: Not on file  . Number of children: 0  . Years of education: Not on file  . Highest education level: 9th grade  Occupational History  . Occupation: disabled  Tobacco Use  . Smoking status: Current Every Day Smoker    Packs/day: 1.00    Years: 35.00    Pack years: 35.00    Types: Cigarettes  . Smokeless tobacco: Never Used  . Tobacco comment: 07/2019- 1.5 a day  Substance and Sexual Activity  . Alcohol use: Not Currently    Alcohol/week: 0.0 standard drinks  . Drug use: Not Currently    Types: Marijuana  . Sexual activity: Yes    Birth control/protection: None  Other Topics Concern  . Not on file  Social History Narrative   Moved from [place].   Social Determinants of Health   Financial  Resource Strain:   . Difficulty of Paying Living Expenses: Not on file  Food Insecurity:   . Worried About Charity fundraiser in the Last Year: Not on file  . Ran Out of Food in the Last Year: Not on file  Transportation Needs:   . Lack of Transportation (Medical): Not on file  . Lack of Transportation (Non-Medical): Not on file  Physical Activity:   . Days of Exercise per Week: Not on file  . Minutes of Exercise per Session: Not on file  Stress:   . Feeling of Stress : Not on file  Social Connections:   . Frequency of Communication with Friends and Family: Not on file  . Frequency of Social Gatherings with Friends and Family: Not on file  . Attends Religious Services: Not on file  . Active Member of Clubs or Organizations: Not on file  . Attends Archivist Meetings: Not on file  . Marital Status: Not on file  Intimate Partner Violence:   . Fear of Current or Ex-Partner: Not on file  . Emotionally Abused: Not on file  . Physically Abused: Not on file  . Sexually Abused: Not on file    Review of Systems: See HPI,  otherwise negative ROS  Physical Exam: BP (!) 143/99   Pulse 80   Temp (!) 97.3 F (36.3 C) (Temporal)   Resp 18   Ht 5\' 4"  (1.626 m)   Wt 87.5 kg   LMP 12/16/2017 (Approximate)   SpO2 99%   BMI 33.13 kg/m  General:   Alert,  pleasant and cooperative in NAD Head:  Normocephalic and atraumatic. Neck:  Supple; no masses or thyromegaly. Lungs:  Clear throughout to auscultation, normal respiratory effort.    Heart:  +S1, +S2, Regular rate and rhythm, No edema. Abdomen:  Soft, nontender and nondistended. Normal bowel sounds, without guarding, and without rebound.   Neurologic:  Alert and  oriented x4;  grossly normal neurologically.  Impression/Plan: Sara Munoz is here for an endoscopy  to be performed for  evaluation of melena    Risks, benefits, limitations, and alternatives regarding endoscopy have been reviewed with the patient.  Questions have been answered.  All parties agreeable.   [name], MD  08/10/2019, 8:29 AM

## 2019-08-10 NOTE — Anesthesia Preprocedure Evaluation (Signed)
Anesthesia Evaluation  Patient identified by MRN, date of birth, ID band Patient awake    Reviewed: Allergy & Precautions, NPO status , Patient's Chart, lab work & pertinent test results, reviewed documented beta blocker date and time   History of Anesthesia Complications Negative for: history of anesthetic complications  Airway Mallampati: III  TM Distance: >3 FB     Dental  (+) Chipped, Dental Advidsory Given   Pulmonary neg shortness of breath, asthma , sleep apnea , COPD,  COPD inhaler, neg recent URI, Current Smoker,           Cardiovascular hypertension, Pt. on medications and Pt. on home beta blockers (-) angina+ CAD, + Past MI, + Cardiac Stents and + Peripheral Vascular Disease  (-) dysrhythmias (-) Valvular Problems/Murmurs     Neuro/Psych  Headaches, PSYCHIATRIC DISORDERS Anxiety TIA Neuromuscular disease    GI/Hepatic GERD  ,(+) Hepatitis -  Endo/Other  negative endocrine ROS  Renal/GU      Musculoskeletal  (+) Arthritis ,   Abdominal   Peds  Hematology  (+) Blood dyscrasia, anemia ,   Anesthesia Other Findings Past Medical History: No date: Anxiety No date: Asthma 08/02/2015: Atypical chest pain No date: CAD (coronary artery disease) No date: Chronic back pain No date: COPD (chronic obstructive pulmonary disease) ([facility]) No date: Coronary artery disease     Comment:  a. Mild-non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/2014 No date: GERD (gastroesophageal reflux disease) No date: Hypercholesteremia No date: Hypertension No date: Hypokalemia 01/04/2014: Hypomagnesemia No date: Liver disease No date: MI (myocardial infarction) ([place]) 02/27/2016: Opiate use No date: Osteoarthritis No date: Stroke [facility])   Reproductive/Obstetrics                             Anesthesia Physical  Anesthesia Plan  ASA: III  Anesthesia Plan: General   Post-op Pain Management:    Induction:  Intravenous  PONV Risk Score and Plan: 2 and Propofol infusion and TIVA  Airway Management Planned: Natural Airway and Nasal Cannula  Additional Equipment:   Intra-op Plan:   Post-operative Plan:   Informed Consent: I have reviewed the patients History and Physical, chart, labs and discussed the procedure including the risks, benefits and alternatives for the proposed anesthesia with the patient or authorized representative who has indicated his/her understanding and acceptance.       Plan Discussed with: CRNA  Anesthesia Plan Comments:         Anesthesia Quick Evaluation

## 2019-08-10 NOTE — Anesthesia Procedure Notes (Signed)
Date/Time: 08/10/2019 8:43 AM Performed by: Nelda Marseille, CRNA Pre-anesthesia Checklist: Patient identified, Emergency Drugs available, Suction available, Patient being monitored and Timeout performed Oxygen Delivery Method: Nasal cannula

## 2019-08-10 NOTE — Op Note (Signed)
[facility] Gastroenterology Patient Name: Sara Munoz Procedure Date: 08/10/2019 8:30 AM MRN: [id number] Account #: [id number] Date of Birth: 02/25/1966 Admit Type: Outpatient Age: 54 Room: [facility] ENDO ROOM 4 Gender: Female Note Status: Finalized Procedure:             Upper GI endoscopy Indications:           Melena Providers:             [name] MD, MD Medicines:             Monitored Anesthesia Care Complications:         No immediate complications. Procedure:             Pre-Anesthesia Assessment:                        - Prior to the procedure, a History and Physical was                         performed, and patient medications, allergies and                         sensitivities were reviewed. The patient's tolerance                         of previous anesthesia was reviewed.                        - The risks and benefits of the procedure and the                         sedation options and risks were discussed with the                         patient. All questions were answered and informed                         consent was obtained.                        - ASA Grade Assessment: II - A patient with mild                         systemic disease.                        After obtaining informed consent, the endoscope was                         passed under direct vision. Throughout the procedure,                         the patient's blood pressure, pulse, and oxygen                         saturations were monitored continuously. The Endoscope                         was introduced through the mouth, and advanced to the  third part of duodenum. The upper GI endoscopy was                         accomplished with ease. The patient tolerated the                         procedure well. Findings:      The esophagus was normal.      The examined duodenum was normal.      Localized moderate inflammation characterized by  congestion (edema),       erosions and erythema was found in the gastric antrum. Biopsies were       taken with a cold forceps for histology.      The cardia and gastric fundus were normal on retroflexion. Impression:            - Normal esophagus.                        - Normal examined duodenum.                        - Gastritis. Biopsied. Recommendation:        - Await pathology results.                        - Discharge patient to home (with escort).                        - Resume previous diet.                        - Continue present medications.                        - Await pathology results.                        - Return to my office in 2 weeks. Procedure Code(s):     --- Professional ---                        [phone], Esophagogastroduodenoscopy, flexible,                         transoral; with biopsy, single or multiple Diagnosis Code(s):     --- Professional ---                        K92.1, Melena (includes Hematochezia)                        K29.70, Gastritis, unspecified, without bleeding CPT copyright 2019 American Medical Association. All rights reserved. The codes documented in this report are preliminary and upon coder review may  be revised to meet current compliance requirements. [name], MD [name] MD, MD 08/10/2019 8:44:19 AM This report has been signed electronically. Number of Addenda: 0 Note Initiated On: 08/10/2019 8:30 AM Estimated Blood Loss:  Estimated blood loss: none.      [facility], [facility]

## 2019-08-10 NOTE — Anesthesia Postprocedure Evaluation (Signed)
Anesthesia Post Note  Patient: Sara Munoz  Procedure(s) Performed: ESOPHAGOGASTRODUODENOSCOPY (EGD) WITH PROPOFOL (N/A )  Patient location during evaluation: Endoscopy Anesthesia Type: General Level of consciousness: awake and alert Pain management: pain level controlled Vital Signs Assessment: post-procedure vital signs reviewed and stable Respiratory status: spontaneous breathing, nonlabored ventilation, respiratory function stable and patient connected to nasal cannula oxygen Cardiovascular status: blood pressure returned to baseline and stable Postop Assessment: no apparent nausea or vomiting Anesthetic complications: no     Last Vitals:  Vitals:   08/10/19 0859 08/10/19 0909  BP: 114/75 (!) 148/82  Pulse:    Resp:    Temp:    SpO2:      Last Pain:  Vitals:   08/10/19 0909  TempSrc:   PainSc: 0-No pain                 Martha Clan

## 2019-08-10 NOTE — Transfer of Care (Signed)
Immediate Anesthesia Transfer of Care Note  Patient: Sara Munoz  Procedure(s) Performed: ESOPHAGOGASTRODUODENOSCOPY (EGD) WITH PROPOFOL (N/A )  Patient Location: PACU  Anesthesia Type:General  Level of Consciousness: awake, alert  and oriented  Airway & Oxygen Therapy: Patient Spontanous Breathing and Patient connected to nasal cannula oxygen  Post-op Assessment: Report given to RN and Post -op Vital signs reviewed and stable  Post vital signs: Reviewed and stable  Last Vitals:  Vitals Value Taken Time  BP    Temp    Pulse 76 08/10/19 0848  Resp 24 08/10/19 0848  SpO2 97 % 08/10/19 0848  Vitals shown include unvalidated device data.  Last Pain:  Vitals:   08/10/19 0743  TempSrc: Temporal  PainSc: 0-No pain         Complications: No apparent anesthesia complications

## 2019-08-11 ENCOUNTER — Ambulatory Visit (INDEPENDENT_AMBULATORY_CARE_PROVIDER_SITE_OTHER): Payer: Medicaid Other | Admitting: Physician Assistant

## 2019-08-11 ENCOUNTER — Encounter: Payer: Self-pay | Admitting: *Deleted

## 2019-08-11 VITALS — BP 130/80 | HR 75 | Temp 96.8°F | Wt 192.4 lb

## 2019-08-11 DIAGNOSIS — F419 Anxiety disorder, unspecified: Secondary | ICD-10-CM

## 2019-08-11 DIAGNOSIS — Z01818 Encounter for other preprocedural examination: Secondary | ICD-10-CM | POA: Diagnosis not present

## 2019-08-11 DIAGNOSIS — R0602 Shortness of breath: Secondary | ICD-10-CM | POA: Diagnosis not present

## 2019-08-11 DIAGNOSIS — E7800 Pure hypercholesterolemia, unspecified: Secondary | ICD-10-CM | POA: Diagnosis not present

## 2019-08-11 DIAGNOSIS — I208 Other forms of angina pectoris: Secondary | ICD-10-CM | POA: Diagnosis not present

## 2019-08-11 DIAGNOSIS — F1910 Other psychoactive substance abuse, uncomplicated: Secondary | ICD-10-CM

## 2019-08-11 DIAGNOSIS — I10 Essential (primary) hypertension: Secondary | ICD-10-CM

## 2019-08-11 LAB — POCT URINALYSIS DIPSTICK
Bilirubin, UA: NEGATIVE
Blood, UA: NEGATIVE
Glucose, UA: NEGATIVE
Ketones, UA: NEGATIVE
Leukocytes, UA: NEGATIVE
Nitrite, UA: NEGATIVE
Protein, UA: POSITIVE — AB
Spec Grav, UA: 1.01 (ref 1.010–1.025)
Urobilinogen, UA: 0.2 E.U./dL
pH, UA: 6 (ref 5.0–8.0)

## 2019-08-11 LAB — SURGICAL PATHOLOGY

## 2019-08-11 NOTE — Progress Notes (Signed)
Patient: Sara Munoz Female    DOB: March 10, 1966   54 y.o.   MRN: [id number] Visit Date: 08/11/2019  Today's Provider: [name], [name]   Chief Complaint  Patient presents with  . Surgical Clearance   Subjective:     HPI  Surgical Clearance Patient presents today for surgical clearance.Patient states that she will be having surgery on her left hand and wrist for carpal tunnel syndrome and tendon repair. She is having this done with Raliegh Ip. She has had multiple prior surgeries and has not had any issues with anesthesia. She does not have sleep apnea. She continues to smoke one pack per day. She is not using anti-inflammatories or blood thinners. She has COPD and reports compliance with inhalers. She does not feel ill or SOB above her baseline. She has a history of CAD and is attending an appointment this afternoon for cardiology clearance.   HTN: She is currently taking lisinopril 40 mg QD and metorpolol tartrate 25 mg BID.   BP Readings from Last 8 Encounters:  08/11/19 130/80  08/10/19 (!) 148/82  08/05/19 (!) 144/82  07/27/19 (!) 156/85  01/12/19 114/62  01/07/19 (!) 159/80  12/24/18 (!) 150/99  12/01/18 (!) 168/98   Anxiety: She reports her anxiety is worse. She is currently taking 60 mg cymbalta from neurology. She was previously on buspar in 2018 but says this didn't work.   Substance Abuse: Patient has longstanding substance abuse history including heroin and cocaine use, DWI in 1990's and continuous use of opioids and benzodiazepines from as many as 26 separate providers. She has a history of hepatitis C that was treated in 2018. She reported at that time she was not using alcohol. However, she reports today that she got a DWI one year ago and states her lawyer is requesting a letter explaining that she cannot do community service due to her health issues.   Allergies  Allergen Reactions  . Flexeril [Cyclobenzaprine] Hives and Swelling    Facial/lip  swelling     . Levofloxacin Hives and Swelling  . Ketorolac   . Phenergan [Promethazine Hcl] Other (See Comments)    Agitation.   . Toradol [Ketorolac Tromethamine] Swelling and Other (See Comments)    Reaction:  Facial/tongue swelling  Reaction:  Facial/tongue swelling   . Tramadol Hives, Swelling and Other (See Comments)    Reaction:  Lip swelling  Reaction:  Lip swelling   . Zoloft [Sertraline Hcl] Swelling    Tongue swelling        Current Outpatient Medications:  .  albuterol (PROVENTIL HFA;VENTOLIN HFA) 108 (90 Base) MCG/ACT inhaler, Inhale 2 puffs into the lungs every 6 (six) hours as needed for wheezing or shortness of breath., Disp: 1 Inhaler, Rfl: 2 .  albuterol (PROVENTIL) (2.5 MG/3ML) 0.083% nebulizer solution, Take 3 mLs (2.5 mg total) by nebulization every 6 (six) hours as needed for wheezing or shortness of breath., Disp: 75 mL, Rfl: 12 .  atorvastatin (LIPITOR) 20 MG tablet, Take 1 tablet (20 mg total) by mouth daily., Disp: 90 tablet, Rfl: 3 .  budesonide-formoterol (SYMBICORT) 160-4.5 MCG/ACT inhaler, Inhale 2 puffs into the lungs 2 (two) times daily., Disp: 1 Inhaler, Rfl: 0 .  docusate sodium (COLACE) 100 MG capsule, Take 200 mg by mouth 2 (two) times daily as needed for mild constipation., Disp: , Rfl:  .  DULoxetine (CYMBALTA) 60 MG capsule, Take 1 capsule (60 mg total) by mouth daily. Please call [phone] to  schedule appt or may request future refills from PCP., Disp: 30 capsule, Rfl: 2 .  gabapentin (NEURONTIN) 600 MG tablet, Take 600 mg by mouth 3 (three) times daily., Disp: , Rfl:  .  lisinopril (PRINIVIL,ZESTRIL) 40 MG tablet, Take 1 tablet (40 mg total) by mouth daily., Disp: 90 tablet, Rfl: 1 .  metoprolol tartrate (LOPRESSOR) 25 MG tablet, TAKE 1 TABLET BY MOUTH TWICE A DAY, Disp: 180 tablet, Rfl: 0 .  Naloxone HCl (NARCAN IJ), Inject as directed as needed., Disp: , Rfl:  .  omeprazole (PRILOSEC) 40 MG capsule, Take 1 capsule (40 mg total) by mouth 2 (two)  times daily., Disp: 90 capsule, Rfl: 3 .  ondansetron (ZOFRAN-ODT) 8 MG disintegrating tablet, Take 1 tablet (8 mg total) by mouth 2 (two) times daily. 30-day rx. Please call [phone] to schedule appt for continued refills., Disp: 20 tablet, Rfl: 0 .  Oxycodone HCl 10 MG TABS, Take 10 mg by mouth 6 (six) times daily. , Disp: , Rfl:  .  rizatriptan (MAXALT-MLT) 10 MG disintegrating tablet, Take 1 tab at onset of migraine.  May repeat in 2 hrs, if needed.  Max dose: 2 tabs/day. This is a 30 day prescription.  Please call [phone] to schedule appt or may request future refills from PCP., Disp: 9 tablet, Rfl: 2 .  Tiotropium Bromide Monohydrate (SPIRIVA RESPIMAT) 2.5 MCG/ACT AERS, Inhale 1 puff into the lungs daily., Disp: 4 g, Rfl: 3 .  tiZANidine (ZANAFLEX) 4 MG tablet, Take 4 mg by mouth 3 (three) times daily. , Disp: , Rfl:  .  nitroGLYCERIN (NITROSTAT) 0.4 MG SL tablet, Place 1 tablet (0.4 mg total) under the tongue every 5 (five) minutes x 3 doses as needed for chest pain. (Patient not taking: Reported on 08/10/2019), Disp: 30 tablet, Rfl: 2  Review of Systems  Constitutional: Negative.  Negative for fatigue.  Respiratory: Negative for chest tightness and shortness of breath.   Neurological: Negative for weakness, light-headedness and headaches.    Social History   Tobacco Use  . Smoking status: Current Every Day Smoker    Packs/day: 1.00    Years: 35.00    Pack years: 35.00    Types: Cigarettes  . Smokeless tobacco: Never Used  . Tobacco comment: 07/2019- 1.5 a day  Substance Use Topics  . Alcohol use: Not Currently    Alcohol/week: 0.0 standard drinks      Objective:   BP 130/80 (BP Location: Right Arm, Patient Position: Sitting, Cuff Size: Normal)   Pulse 75   Temp (!) 96.8 F (36 C) (Temporal)   Wt 192 lb 6.4 oz (87.3 kg)   LMP 12/16/2017 (Approximate)   SpO2 97%   BMI 33.03 kg/m  Vitals:   08/11/19 1342  BP: 130/80  Pulse: 75  Temp: (!) 96.8 F (36 C)  TempSrc:  Temporal  SpO2: 97%  Weight: 192 lb 6.4 oz (87.3 kg)  Body mass index is 33.03 kg/m.   Physical Exam Constitutional:      Appearance: Normal appearance. She is obese. She is not ill-appearing.  Cardiovascular:     Rate and Rhythm: Normal rate and regular rhythm.     Heart sounds: Normal heart sounds.  Pulmonary:     Effort: Pulmonary effort is normal. No respiratory distress.     Breath sounds: Normal breath sounds. No wheezing, rhonchi or rales.  Skin:    General: Skin is warm and dry.  Neurological:     Mental Status: She is alert and oriented  to person, place, and time. Mental status is at baseline.  Psychiatric:        Mood and Affect: Mood normal.        Behavior: Behavior normal.      No results found for any visits on 08/11/19.     Assessment & Plan    1. Pre-op examination  Will fax over clearance. She has chronic illnesses including HTN and COPD which are moderately controlled. She is smoking one pack per day and understands the potential this has to inhibit healing. She is getting cardiology clearance today.  - CBC with Differential - Comprehensive Metabolic Panel (CMET) - HgB A1c - POCT urinalysis dipstick  2. Hypercholesterolemia  Continue statin.  3. Essential hypertension  Continue medications.  4. Anxiety  She is on cymbalta. She has failed buspar. Do not feel comfortable adding any additional medications. Recommend psychiatry.  5. Substance abuse ([place])  Seems to be active. DWI within one year though patient reported not drinking. Declined to write note stating she cannot perform community service. I do not think her COPD and HTN limit her from any and all community service. If she would like to pursue this on the basis of her orthopedic conditions then she may ask the providers who are treating her for those issues.   The entirety of the information documented in the History of Present Illness, Review of Systems and Physical Exam were personally  obtained by me. Portions of this information were initially documented by [facility] and reviewed by me for thoroughness and accuracy.   I have spent 25 minutes with this patient, >50% of which was spent on counseling and coordination of care.       [name], [name]  [facility]

## 2019-08-12 LAB — COMPREHENSIVE METABOLIC PANEL
ALT: 12 IU/L (ref 0–32)
AST: 17 IU/L (ref 0–40)
Albumin/Globulin Ratio: 1.6 (ref 1.2–2.2)
Albumin: 4.3 g/dL (ref 3.8–4.9)
Alkaline Phosphatase: 89 IU/L (ref 39–117)
BUN/Creatinine Ratio: 9 (ref 9–23)
BUN: 8 mg/dL (ref 6–24)
Bilirubin Total: <0.2 mg/dL (ref 0.0–1.2)
CO2: 22 mmol/L (ref 20–29)
Calcium: 9.5 mg/dL (ref 8.7–10.2)
Chloride: 102 mmol/L (ref 96–106)
Creatinine, Ser: 0.92 mg/dL (ref 0.57–1.00)
GFR calc Af Amer: 83 mL/min/{1.73_m2} (ref >59)
GFR calc non Af Amer: 72 mL/min/{1.73_m2} (ref >59)
Globulin, Total: 2.7 g/dL (ref 1.5–4.5)
Glucose: 70 mg/dL (ref 65–99)
Potassium: 4.1 mmol/L (ref 3.5–5.2)
Sodium: 139 mmol/L (ref 134–144)
Total Protein: 7 g/dL (ref 6.0–8.5)

## 2019-08-12 LAB — HEMOGLOBIN A1C
Est. average glucose Bld gHb Est-mCnc: 123 mg/dL
Hgb A1c MFr Bld: 5.9 % — ABNORMAL HIGH (ref 4.8–5.6)

## 2019-08-12 LAB — CBC WITH DIFFERENTIAL/PLATELET
Basophils Absolute: 0 10*3/uL (ref 0.0–0.2)
Basos: 1 %
EOS (ABSOLUTE): 0.2 10*3/uL (ref 0.0–0.4)
Eos: 3 %
Hematocrit: 40.3 % (ref 34.0–46.6)
Hemoglobin: 13.7 g/dL (ref 11.1–15.9)
Immature Grans (Abs): 0 10*3/uL (ref 0.0–0.1)
Immature Granulocytes: 0 %
Lymphocytes Absolute: 2.3 10*3/uL (ref 0.7–3.1)
Lymphs: 33 %
MCH: 28.9 pg (ref 26.6–33.0)
MCHC: 34 g/dL (ref 31.5–35.7)
MCV: 85 fL (ref 79–97)
Monocytes Absolute: 0.6 10*3/uL (ref 0.1–0.9)
Monocytes: 9 %
Neutrophils Absolute: 3.8 10*3/uL (ref 1.4–7.0)
Neutrophils: 54 %
Platelets: 250 10*3/uL (ref 150–450)
RBC: 4.74 x10E6/uL (ref 3.77–5.28)
RDW: 15.6 % — ABNORMAL HIGH (ref 11.7–15.4)
WBC: 7 10*3/uL (ref 3.4–10.8)

## 2019-08-13 ENCOUNTER — Other Ambulatory Visit: Payer: Self-pay | Admitting: Physician Assistant

## 2019-08-13 ENCOUNTER — Other Ambulatory Visit: Payer: Self-pay | Admitting: Internal Medicine

## 2019-08-13 DIAGNOSIS — M25511 Pain in right shoulder: Secondary | ICD-10-CM | POA: Diagnosis not present

## 2019-08-13 DIAGNOSIS — R0602 Shortness of breath: Secondary | ICD-10-CM

## 2019-08-13 DIAGNOSIS — M542 Cervicalgia: Secondary | ICD-10-CM | POA: Diagnosis not present

## 2019-08-13 DIAGNOSIS — M545 Low back pain: Secondary | ICD-10-CM | POA: Diagnosis not present

## 2019-08-13 DIAGNOSIS — M25512 Pain in left shoulder: Secondary | ICD-10-CM | POA: Diagnosis not present

## 2019-08-13 DIAGNOSIS — R110 Nausea: Secondary | ICD-10-CM

## 2019-08-13 DIAGNOSIS — Z01818 Encounter for other preprocedural examination: Secondary | ICD-10-CM

## 2019-08-13 NOTE — Telephone Encounter (Signed)
Please advise 

## 2019-08-15 ENCOUNTER — Encounter: Payer: Self-pay | Admitting: Gastroenterology

## 2019-08-16 ENCOUNTER — Other Ambulatory Visit: Payer: Self-pay | Admitting: Student

## 2019-08-16 DIAGNOSIS — Z01818 Encounter for other preprocedural examination: Secondary | ICD-10-CM

## 2019-08-16 DIAGNOSIS — I208 Other forms of angina pectoris: Secondary | ICD-10-CM

## 2019-08-25 ENCOUNTER — Ambulatory Visit: Payer: Medicaid Other | Admitting: Gastroenterology

## 2019-08-25 ENCOUNTER — Other Ambulatory Visit: Payer: Self-pay

## 2019-08-25 VITALS — BP 130/82 | HR 72 | Temp 97.7°F | Ht 64.0 in | Wt 191.6 lb

## 2019-08-25 DIAGNOSIS — R110 Nausea: Secondary | ICD-10-CM

## 2019-08-25 DIAGNOSIS — Z791 Long term (current) use of non-steroidal anti-inflammatories (NSAID): Secondary | ICD-10-CM

## 2019-08-25 DIAGNOSIS — F17200 Nicotine dependence, unspecified, uncomplicated: Secondary | ICD-10-CM

## 2019-08-25 DIAGNOSIS — R109 Unspecified abdominal pain: Secondary | ICD-10-CM

## 2019-08-25 MED ORDER — ONDANSETRON 8 MG PO TBDP: 8.0000 mg | ORAL_TABLET | Freq: Three times a day (TID) | ORAL | 0 refills | Status: DC | PRN

## 2019-08-25 MED ORDER — SUCRALFATE 1 GM/10ML PO SUSP: 1.0000 g | Freq: Four times a day (QID) | ORAL | 2 refills | Status: DC

## 2019-08-25 NOTE — Progress Notes (Signed)
[name] MD, MRCP(U.K) [address]  [place]  [place], [place] [address]  Main: [phone]  Fax: [phone]   Primary Care Physician: [name], [name]  Primary Gastroenterologist:  Dr. [name]   Melena follow-up  HPI: Sara Munoz is a 54 y.o. female   Summary of history : Saraia R Dunlapwas initially referred back in 05/2016 for hepatitis CGT 3.Treatment naive.Completed treatment in 3/2019and attained SVR. CT abdomen 03/17/17- increased stool burden and hepatic steatosis. Colonoscopy 08/2016 - small adenomas excised  RUQ USG 05/07/16 showed hepatic steatosis.   Labs 12/2016- Albumin 4.1,Hb 14.1 and platelet count 213 09/18/17 : EGD: normal appearance but bx showed mild active gastritis and negative for H pylori .  LFT's normal in 01/2018  02/25/18: H pylori breath test negative 10/19/2018: CT scan of the abdomenPerinephric fat stranding was seen 07/27/2019: Seen at the office complaint of melena was taking BC powders for headaches for years.  Also had issues with constipation.  Interval history 07/27/2019-08/25/2019  08/11/2019: Hemoglobin 13.7 g 08/10/2019 EGD: Localized inflammation seen in the antrum suggestive of gastritis biopsies taken that demonstrated reactive gastropathy.  Negative for H. pylori.  We will have epigastric discomfort.  Not really related to meals.  Feels bloated.  Continues to smoke.  Stopped all BC powders.  Taking her PPI twice a day.  Complains of nausea.   Current Outpatient Medications  Medication Sig Dispense Refill  . albuterol (PROVENTIL HFA;VENTOLIN HFA) 108 (90 Base) MCG/ACT inhaler Inhale 2 puffs into the lungs every 6 (six) hours as needed for wheezing or shortness of breath. 1 Inhaler 2  . albuterol (PROVENTIL) (2.5 MG/3ML) 0.083% nebulizer solution Take 3 mLs (2.5 mg total) by nebulization every 6 (six) hours as needed for wheezing or shortness of breath. 75 mL 12  . atorvastatin (LIPITOR) 20 MG tablet Take 1  tablet (20 mg total) by mouth daily. 90 tablet 3  . budesonide-formoterol (SYMBICORT) 160-4.5 MCG/ACT inhaler Inhale 2 puffs into the lungs 2 (two) times daily. 1 Inhaler 0  . docusate sodium (COLACE) 100 MG capsule Take 200 mg by mouth 2 (two) times daily as needed for mild constipation.    . DULoxetine (CYMBALTA) 60 MG capsule Take 1 capsule (60 mg total) by mouth daily. Please call [phone] to schedule appt or may request future refills from PCP. 30 capsule 2  . gabapentin (NEURONTIN) 600 MG tablet Take 600 mg by mouth 3 (three) times daily.    Marland Kitchen lisinopril (PRINIVIL,ZESTRIL) 40 MG tablet Take 1 tablet (40 mg total) by mouth daily. 90 tablet 1  . metoprolol tartrate (LOPRESSOR) 25 MG tablet TAKE 1 TABLET BY MOUTH TWICE A DAY 180 tablet 0  . Naloxone HCl (NARCAN IJ) Inject as directed as needed.    . nitroGLYCERIN (NITROSTAT) 0.4 MG SL tablet Place 1 tablet (0.4 mg total) under the tongue every 5 (five) minutes x 3 doses as needed for chest pain. (Patient not taking: Reported on 08/10/2019) 30 tablet 2  . omeprazole (PRILOSEC) 40 MG capsule Take 1 capsule (40 mg total) by mouth 2 (two) times daily. 90 capsule 3  . ondansetron (ZOFRAN-ODT) 8 MG disintegrating tablet TAKE 1 TABLET BY MOUTH 2 TIMES DAILY PLEASE CALL [phone] TO SCHEDULE APPT FOR CONTINUED REFILLS. 20 tablet 0  . Oxycodone HCl 10 MG TABS Take 10 mg by mouth 6 (six) times daily.     . rizatriptan (MAXALT-MLT) 10 MG disintegrating tablet Take 1 tab at onset of migraine.  May repeat  in 2 hrs, if needed.  Max dose: 2 tabs/day. This is a 30 day prescription.  Please call [phone] to schedule appt or may request future refills from PCP. 9 tablet 2  . Tiotropium Bromide Monohydrate (SPIRIVA RESPIMAT) 2.5 MCG/ACT AERS Inhale 1 puff into the lungs daily. 4 g 3  . tiZANidine (ZANAFLEX) 4 MG tablet Take 4 mg by mouth 3 (three) times daily.      No current facility-administered medications for this visit.    Allergies as of 08/25/2019 - Review  Complete 08/11/2019  Allergen Reaction Noted  . Flexeril [cyclobenzaprine] Hives and Swelling 11/13/2013  . Levofloxacin Hives and Swelling 02/27/2012  . Ketorolac  10/22/2010  . Phenergan [promethazine hcl] Other (See Comments) 02/21/2017  . Toradol [ketorolac tromethamine] Swelling and Other (See Comments) 01/17/2015  . Tramadol Hives, Swelling, and Other (See Comments) 01/17/2015  . Zoloft [sertraline hcl] Swelling 05/31/2015    ROS:  General: Negative for anorexia, weight loss, fever, chills, fatigue, weakness. ENT: Negative for hoarseness, difficulty swallowing , nasal congestion. CV: Negative for chest pain, angina, palpitations, dyspnea on exertion, peripheral edema.  Respiratory: Negative for dyspnea at rest, dyspnea on exertion, cough, sputum, wheezing.  GI: See history of present illness. GU:  Negative for dysuria, hematuria, urinary incontinence, urinary frequency, nocturnal urination.  Endo: Negative for unusual weight change.    Physical Examination:   LMP 12/16/2017 (Approximate)   General: Well-nourished, well-developed in no acute distress.  Eyes: No icterus. Conjunctivae pink. Abdomen: Bowel sounds are normal, nontender, nondistended, no hepatosplenomegaly or masses, no abdominal bruits or hernia , no rebound or guarding.   Extremities: No lower extremity edema. No clubbing or deformities. Neuro: Alert and oriented x 3.  Grossly intact. Skin: Warm and dry, no jaundice.   Psych: Alert and cooperative, normal mood and affect.   Imaging Studies: No results found.  Assessment and Plan:   Sara Munoz is a 54 y.o. y/o female here to follow up for abdominal pain  and melena.  Stools are returned to brown color.  Stop all BC powder.  Continues to have nausea and epigastric discomfort.  Plan  1.    Continue Prilosec 40 mg twice a day. 2.  Stop all NSAID use and continue to do so. 3.  Zofran as needed as needed for nausea. 4.  HIDA scan.  If negative will get  gastric emptying study. 5.  Smoker: Advised to strongly stop smoking as it can contribute to aerophagia and nausea.   Dr [name]  MD,MRCP [facility]) Follow up in 8 to 10 weeks.

## 2019-09-01 ENCOUNTER — Encounter
Admission: RE | Admit: 2019-09-01 | Discharge: 2019-09-01 | Disposition: A | Discharge status: Home / Self Care | Payer: Medicaid Other | Source: Ambulatory Visit | Attending: Student | Admitting: Student

## 2019-09-01 ENCOUNTER — Ambulatory Visit
Admission: RE | Admit: 2019-09-01 | Discharge: 2019-09-01 | Disposition: A | Discharge status: Home / Self Care | Payer: Medicaid Other | Source: Ambulatory Visit | Attending: Internal Medicine | Admitting: Internal Medicine

## 2019-09-01 ENCOUNTER — Other Ambulatory Visit: Payer: Self-pay

## 2019-09-01 DIAGNOSIS — Z01818 Encounter for other preprocedural examination: Secondary | ICD-10-CM | POA: Diagnosis not present

## 2019-09-01 DIAGNOSIS — I252 Old myocardial infarction: Secondary | ICD-10-CM | POA: Diagnosis not present

## 2019-09-01 DIAGNOSIS — I25118 Atherosclerotic heart disease of native coronary artery with other forms of angina pectoris: Secondary | ICD-10-CM | POA: Diagnosis not present

## 2019-09-01 DIAGNOSIS — I10 Essential (primary) hypertension: Secondary | ICD-10-CM | POA: Diagnosis not present

## 2019-09-01 DIAGNOSIS — J449 Chronic obstructive pulmonary disease, unspecified: Secondary | ICD-10-CM | POA: Insufficient documentation

## 2019-09-01 DIAGNOSIS — I208 Other forms of angina pectoris: Secondary | ICD-10-CM | POA: Diagnosis not present

## 2019-09-01 DIAGNOSIS — Z8673 Personal history of transient ischemic attack (TIA), and cerebral infarction without residual deficits: Secondary | ICD-10-CM | POA: Insufficient documentation

## 2019-09-01 DIAGNOSIS — Z01810 Encounter for preprocedural cardiovascular examination: Secondary | ICD-10-CM | POA: Diagnosis not present

## 2019-09-01 DIAGNOSIS — R0602 Shortness of breath: Secondary | ICD-10-CM | POA: Insufficient documentation

## 2019-09-01 LAB — NM MYOCAR MULTI W/SPECT W/WALL MOTION / EF
Estimated workload: 1 METS
Exercise duration (min): 1 min
Exercise duration (sec): 3 s
LV dias vol: 68 mL (ref 46–106)
LV sys vol: 27 mL
Peak HR: 85 {beats}/min
Percent HR: 50 %
Rest HR: 63 {beats}/min
SDS: 15
SRS: 1
SSS: 15
TID: 0.87

## 2019-09-01 MED ORDER — TECHNETIUM TC 99M TETROFOSMIN IV KIT
31.7900 | PACK | Freq: Once | INTRAVENOUS | Status: AC | PRN
  Administered 2019-09-01: 31.79 via INTRAVENOUS

## 2019-09-01 MED ORDER — REGADENOSON 0.4 MG/5ML IV SOLN
0.4000 mg | Freq: Once | INTRAVENOUS | Status: AC
  Administered 2019-09-01: 0.4 mg via INTRAVENOUS
  Filled 2019-09-01: qty 5

## 2019-09-01 MED ORDER — TECHNETIUM TC 99M TETROFOSMIN IV KIT
10.0000 | PACK | Freq: Once | INTRAVENOUS | Status: AC | PRN
  Administered 2019-09-01: 10.59 via INTRAVENOUS

## 2019-09-01 NOTE — Progress Notes (Signed)
*  PRELIMINARY RESULTS* Echocardiogram 2D Echocardiogram has been performed.  Sherrie Sport 09/01/2019, 10:45 AM

## 2019-09-07 DIAGNOSIS — Z03818 Encounter for observation for suspected exposure to other biological agents ruled out: Secondary | ICD-10-CM | POA: Diagnosis not present

## 2019-09-08 ENCOUNTER — Encounter: Admission: RE | Admit: 2019-09-08 | Payer: Medicaid Other | Source: Ambulatory Visit

## 2019-09-10 DIAGNOSIS — M25512 Pain in left shoulder: Secondary | ICD-10-CM | POA: Diagnosis not present

## 2019-09-10 DIAGNOSIS — M25511 Pain in right shoulder: Secondary | ICD-10-CM | POA: Diagnosis not present

## 2019-09-10 DIAGNOSIS — M545 Low back pain: Secondary | ICD-10-CM | POA: Diagnosis not present

## 2019-09-10 DIAGNOSIS — M542 Cervicalgia: Secondary | ICD-10-CM | POA: Diagnosis not present

## 2019-09-12 ENCOUNTER — Other Ambulatory Visit: Payer: Self-pay | Admitting: Physician Assistant

## 2019-09-12 DIAGNOSIS — R110 Nausea: Secondary | ICD-10-CM

## 2019-09-12 NOTE — Telephone Encounter (Signed)
Requested medication (s) are due for refill today: yes  Requested medication (s) are on the active medication list: yes  Last refill:  08/25/19  Future visit scheduled: no  Notes to clinic:  was last prescribed by GI Dr [name] Medication not delegated to refill this medication   Requested Prescriptions  Pending Prescriptions Disp Refills   ondansetron (ZOFRAN-ODT) 8 MG disintegrating tablet [Pharmacy Med Name: ONDANSETRON ODT 8 MG TABLET] 20 tablet     Sig: TAKE 1 TABLET BY MOUTH 2 TIMES DAILY PLEASE CALL [phone] TO SCHEDULE APPT FOR CONTINUED REFILLS.      Not Delegated - Gastroenterology: Antiemetics Failed - 09/12/2019 10:35 AM      Failed - This refill cannot be delegated      Passed - Valid encounter within last 6 months    Recent Outpatient Visits           1 month ago Pre-op examination   [place], [place], [place]   1 month ago Hypercholesterolemia   [place], [place], [place]   10 months ago Hospital discharge follow-up   [facility] [name], [place]   1 year ago Essential hypertension   [place], [place], [place]   1 year ago Hyperlipidemia, unspecified hyperlipidemia type   [place], [place], [place]

## 2019-09-13 NOTE — Telephone Encounter (Signed)
Prescribed by Dr. Vicente Males

## 2019-09-13 NOTE — Telephone Encounter (Signed)
Sara Munoz is out the office until 09/17/2019,please advise.

## 2019-09-16 DIAGNOSIS — G5602 Carpal tunnel syndrome, left upper limb: Secondary | ICD-10-CM | POA: Diagnosis not present

## 2019-09-16 DIAGNOSIS — M654 Radial styloid tenosynovitis [de Quervain]: Secondary | ICD-10-CM | POA: Diagnosis not present

## 2019-09-25 ENCOUNTER — Other Ambulatory Visit: Payer: Self-pay | Admitting: Physician Assistant

## 2019-09-25 DIAGNOSIS — I10 Essential (primary) hypertension: Secondary | ICD-10-CM

## 2019-09-25 NOTE — Telephone Encounter (Signed)
Requested Prescriptions  Pending Prescriptions Disp Refills  . metoprolol tartrate (LOPRESSOR) 25 MG tablet [Pharmacy Med Name: METOPROLOL TARTRATE 25 MG TAB] 180 tablet 0    Sig: TAKE 1 TABLET BY MOUTH TWICE A DAY     Cardiovascular:  Beta Blockers Passed - 09/25/2019 10:17 AM      Passed - Last BP in normal range    BP Readings from Last 1 Encounters:  08/25/19 130/82         Passed - Last Heart Rate in normal range    Pulse Readings from Last 1 Encounters:  08/25/19 72         Passed - Valid encounter within last 6 months    Recent Outpatient Visits          1 month ago Pre-op examination   [place], Wendee Beavers, [name]   1 month ago Hypercholesterolemia   [facility] Carles Collet M, [place]   11 months ago Hospital discharge follow-up   [place], Wendee Beavers, [place]   1 year ago Essential hypertension   [place], [place], [place]   1 year ago Hyperlipidemia, unspecified hyperlipidemia type   [place], [place], [place]

## 2019-09-27 ENCOUNTER — Other Ambulatory Visit: Payer: Self-pay | Admitting: Gastroenterology

## 2019-09-27 DIAGNOSIS — R110 Nausea: Secondary | ICD-10-CM

## 2019-10-01 ENCOUNTER — Telehealth: Payer: Self-pay

## 2019-10-01 DIAGNOSIS — R110 Nausea: Secondary | ICD-10-CM

## 2019-10-01 NOTE — Telephone Encounter (Signed)
Patient was advised that the GI doctor has the refill request pending and she can reach out to them to see if they will refill the medication and if not call the office back.

## 2019-10-01 NOTE — Telephone Encounter (Signed)
I called and spoke with patient she request refill on her Zofran. I reviewed over medication list and I noticed that it was last prescribed on 09/13/19 by another provider, it also states under patient sig to call [phone] to schedule appt for continued refills. I questioned patient to find out who filled this prescription last and she states that it was [place] who  prescribed medication, she had no idea where the number [phone] came from, I told her that this same comment would have been given to pharmacist as well and she should have been notified of this at time of pick up, I asked patient about doctor listed as ordering last and she states she does not know who they are. Patient would like new prescription sent to [facility], please advise. KW

## 2019-10-01 NOTE — Telephone Encounter (Signed)
Copied from [place] [phone]. Topic: General - Call Back - No Documentation >> Oct 01, 2019 12:54 PM Erick Blinks wrote: [phone] Pt needs call back from Clinic, wants to speak to nurse about medication. Please advise

## 2019-10-03 IMAGING — CT CT NECK W/ CM
4 of 5 series · 14 of 33 positions shown, 16 images · IV contrast (omnipaque)
Comparison: Intraoperative images 03/30/18.

CLINICAL DATA: 52-year-old female postoperative day 2 status post
ACDF. Posterior head and neck pain with numbness.

EXAM:
CT NECK WITH CONTRAST
TECHNIQUE: Multidetector CT imaging of the neck was performed using the
standard protocol following the bolus administration of intravenous
contrast.
CONTRAST:  75mL OMNIPAQUE IOHEXOL 300 MG/ML  SOLN

[Series 2: axial neck · axial · 0.60mm/px · z∈[-255,-117]mm · 4 of 115 slices shown, 5 images]
[im 23/115  soft-tissue]
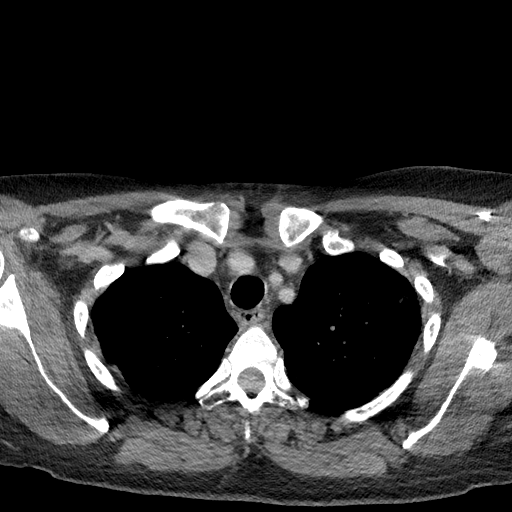
[im 23/115  bone]
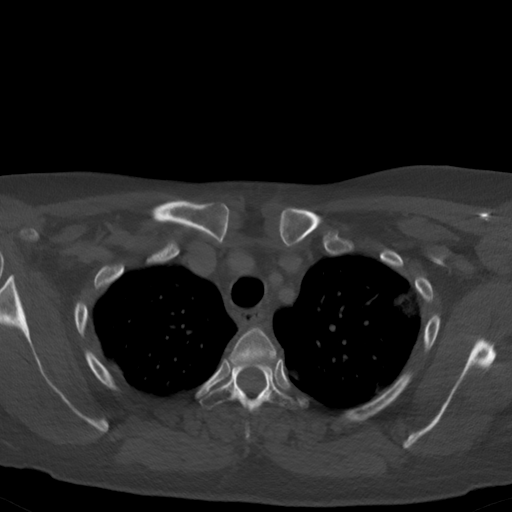
[im 46/115  bone]
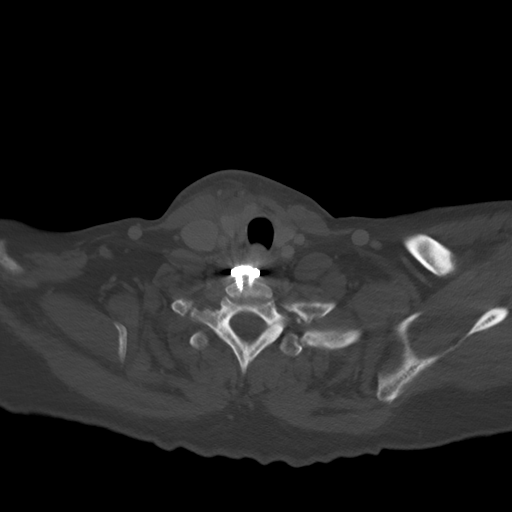
[im 69/115  bone]
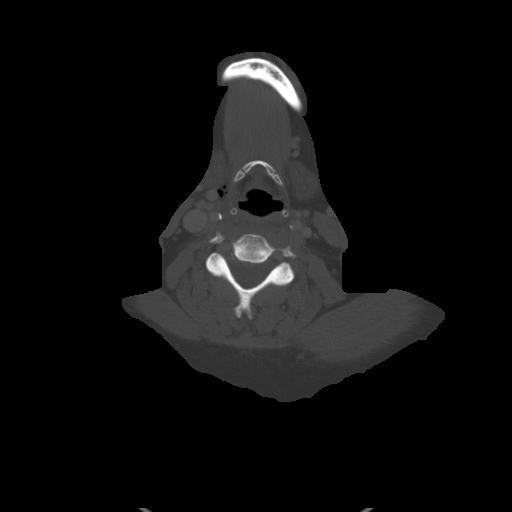
[im 92/115  bone]
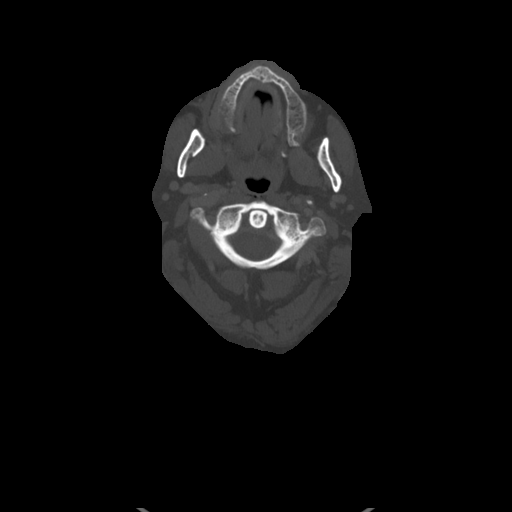

[Series 6: sag neck · sagittal · 0.46mm/px · 5 of 80 slices shown, 6 images]
[im 27/80  bone]
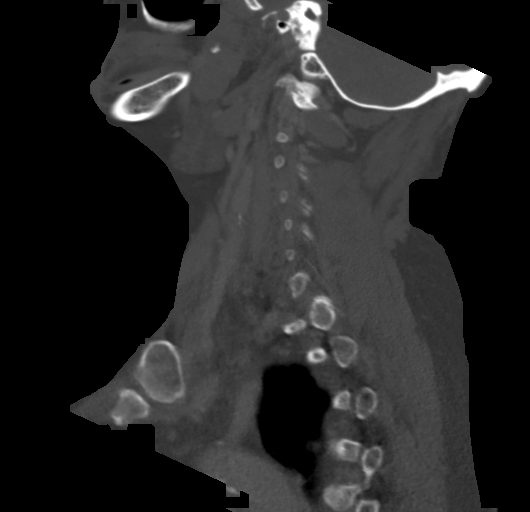
[im 33/80  bone]
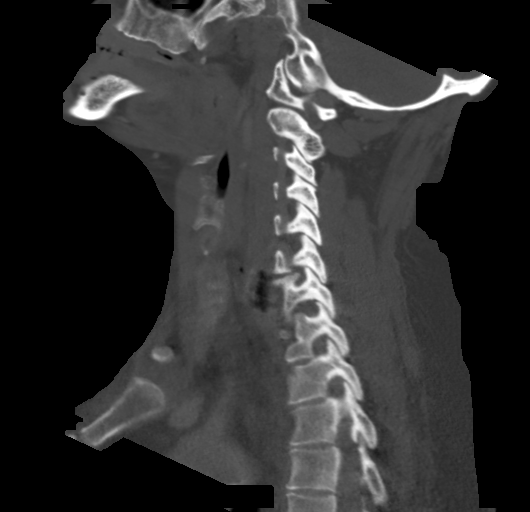
[im 40/80  soft-tissue]
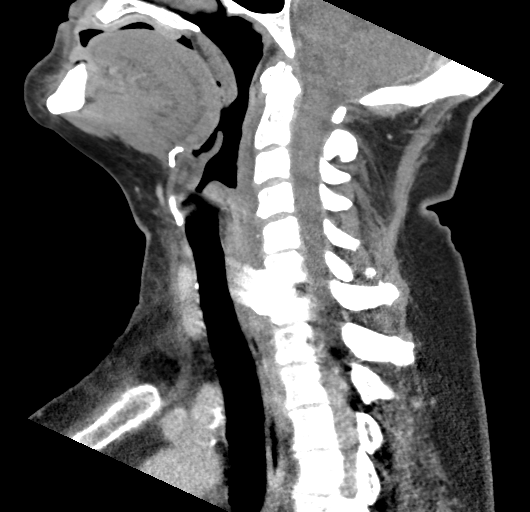
[im 40/80  bone]
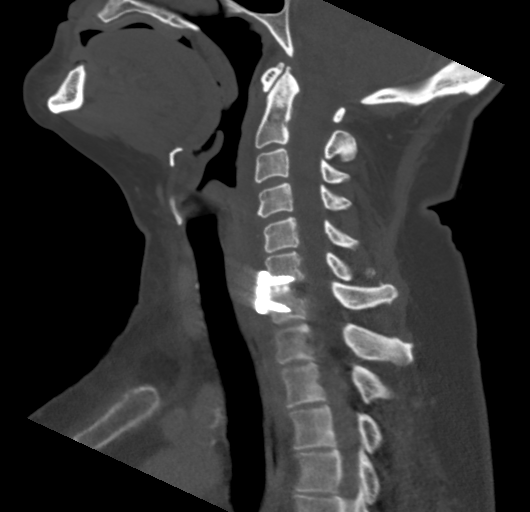
[im 47/80  bone]
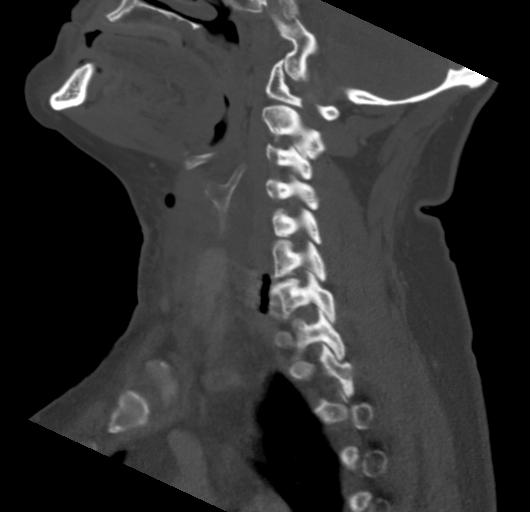
[im 53/80  bone]
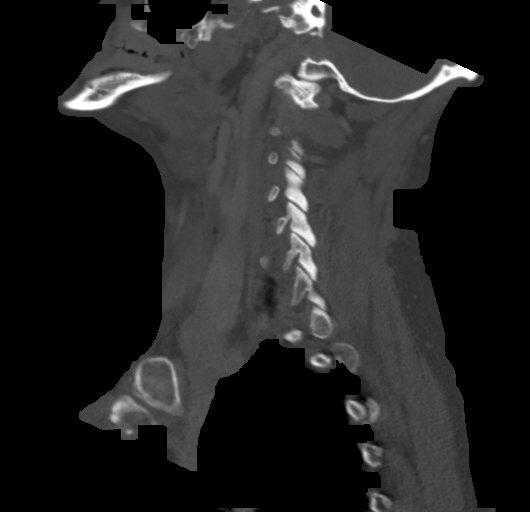

[Series 7: cor neck · coronal · 0.36mm/px · 3 of 107 slices shown]
[im 38/107  bone]
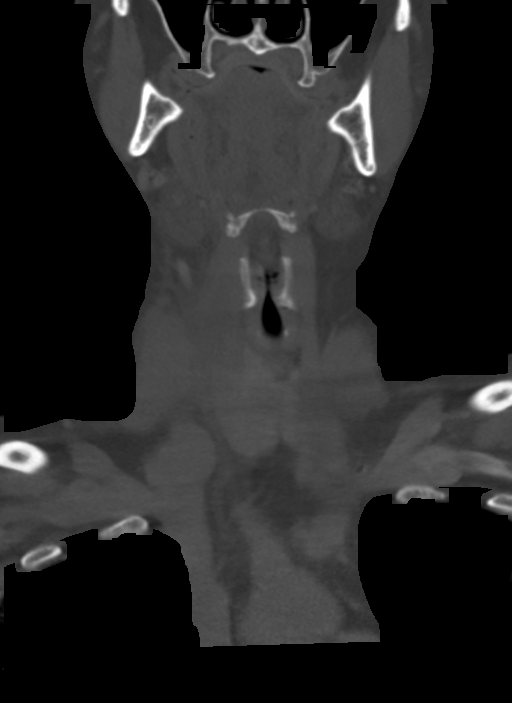
[im 48/107  bone]
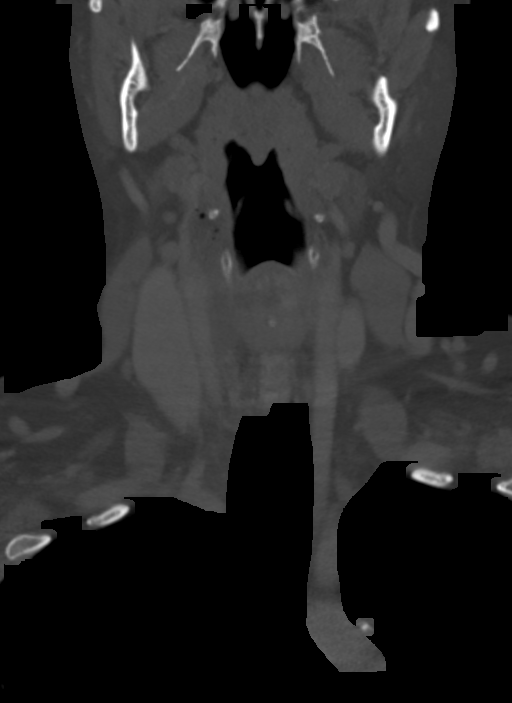
[im 59/107  bone]
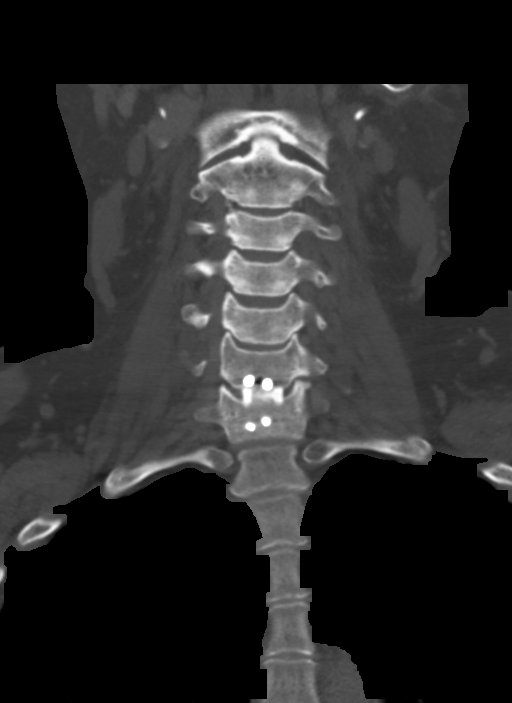

[Series 8: orthogonal ax · axial · 0.38mm/px · z∈[-289,-250]mm · 2 of 113 slices shown]
[im 23/113  bone]
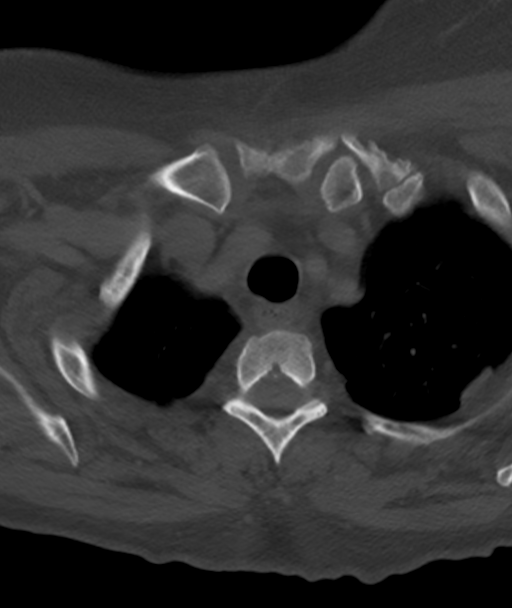
[im 45/113  bone]
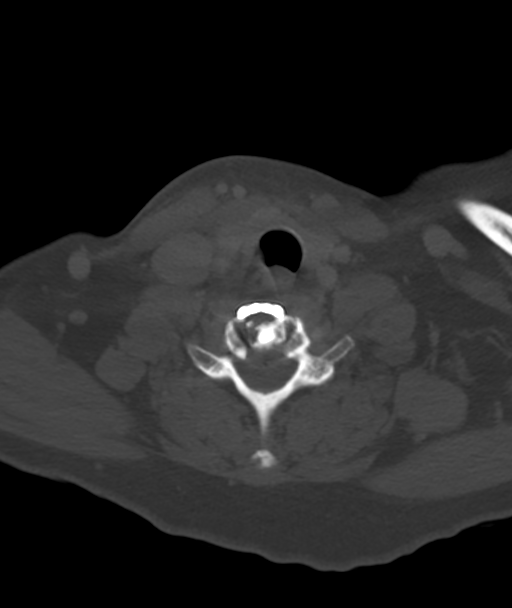

[14 of 33 positions shown; findings below may reference images not displayed]

Cervical spine MRI
09/11/2017. Cervical spine CT 05/13/2017. chest CTA 03/27/2016.
FINDINGS: Pharynx and larynx: Laryngeal and pharyngeal soft tissue contours
are within normal limits. The superior parapharyngeal spaces are
normal.

In the retropharyngeal space beginning at C3-C4 there is mild fluid
or edema with prevertebral soft tissue thickening measuring 6-7
millimeters (series 6, image 41). C4-C5 disc level level to the left
of midline there is faint curvilinear hyperdensity or enhancement
(series 2, image 53). Just caudal to this level anterior to the C5
vertebral body there are tiny calcific fragments (series 4, image 56
and sagittal image 43).

From that region fluid/edema tracks into the right parapharyngeal
space and medial to the right carotid space toward the right
anterior neck incision site. There is a small volume of gas also
tracking anteriorly and superiorly between the carotid space and
strap muscles. There is a tiny focus of gas also to the left of the
lower cervical esophagus on series 2, image 64.

Mild edema and inflammation at the right strap muscle and anterior
to the right thyroid.

Salivary glands: Negative sublingual space. Negative left parotid
and submandibular spaces.

Trace stranding/edema along the inferior aspect of the right
submandibular space near the incision site. The right submandibular
gland appears normal. The right parotid space is normal.

Thyroid: Negative aside from mild edema/fluid about the right
thyroid lobe.

Lymph nodes: Negative.  No lymphadenopathy.

Vascular: The major vascular structures in the neck and at the skull
base are patent, including the right carotid.

Limited intracranial: Negative.

Visualized orbits: Negative.

Mastoids and visualized paranasal sinuses: Well pneumatized.

Skeleton: Absent dentition. Cervical ACDF hardware at C6-C7 with
interbody spacer. The hardware appears intact. No underlying
vertebral fracture identified. Elsewhere the cervical spine appears
stable since the 5330 CT. No acute osseous abnormality identified.

Upper chest: Negative visible superior mediastinum.

There is patchy bilateral peripheral and peribronchial ground-glass
opacity in the upper lobes, greater on the left (series 5, image
42), new since 5075. The visible Major airways are patent.
IMPRESSION: 1. Postoperative changes from recent C6-C7 ACDF.
Largely expected appearance of soft tissue fluid/edema and trace gas
along the surgical approach to the prevertebral soft tissue space.
There do appear to be tiny ossific fragments anterior to the C5
vertebral body of unclear significance, and similarly a small
nonspecific focus of curvilinear hyperdensity or area of enhancement
anterior to C4-C5.
There is no significant mass effect on the pharynx, and no other
adverse features.
2. Abnormal bilateral upper lobe pulmonary ground-glass opacity is
new since 5075 and suspicious for acute respiratory infection,
bronchopneumonia. Query cough or fever.

## 2019-10-04 DIAGNOSIS — G5602 Carpal tunnel syndrome, left upper limb: Secondary | ICD-10-CM | POA: Diagnosis not present

## 2019-10-04 DIAGNOSIS — M25571 Pain in right ankle and joints of right foot: Secondary | ICD-10-CM | POA: Diagnosis not present

## 2019-10-04 MED ORDER — ONDANSETRON 8 MG PO TBDP: ORAL_TABLET | ORAL | 0 refills | Status: DC

## 2019-10-04 NOTE — Telephone Encounter (Signed)
Pt called stating that she was not able to get her nausea medication refilled and is requesting to have that sent in. Please advise.

## 2019-10-04 NOTE — Telephone Encounter (Signed)
Patient is checking status on a call back from PCP nurse Call back [phone]

## 2019-10-05 NOTE — Telephone Encounter (Signed)
I sent it in yesterday

## 2019-10-05 NOTE — Telephone Encounter (Signed)
Patient was advised medication was send into pharmacy and states that she had picked up her medication on yesterday,10/04/2019.

## 2019-10-08 DIAGNOSIS — M25511 Pain in right shoulder: Secondary | ICD-10-CM | POA: Diagnosis not present

## 2019-10-08 DIAGNOSIS — M542 Cervicalgia: Secondary | ICD-10-CM | POA: Diagnosis not present

## 2019-10-08 DIAGNOSIS — M25512 Pain in left shoulder: Secondary | ICD-10-CM | POA: Diagnosis not present

## 2019-10-08 DIAGNOSIS — M545 Low back pain: Secondary | ICD-10-CM | POA: Diagnosis not present

## 2019-10-27 DIAGNOSIS — M461 Sacroiliitis, not elsewhere classified: Secondary | ICD-10-CM | POA: Diagnosis not present

## 2019-11-05 DIAGNOSIS — M79622 Pain in left upper arm: Secondary | ICD-10-CM | POA: Diagnosis not present

## 2019-11-05 DIAGNOSIS — M542 Cervicalgia: Secondary | ICD-10-CM | POA: Diagnosis not present

## 2019-11-05 DIAGNOSIS — M79662 Pain in left lower leg: Secondary | ICD-10-CM | POA: Diagnosis not present

## 2019-11-05 DIAGNOSIS — M79621 Pain in right upper arm: Secondary | ICD-10-CM | POA: Diagnosis not present

## 2019-11-15 ENCOUNTER — Ambulatory Visit: Payer: Medicaid Other | Admitting: Neurology

## 2019-11-15 NOTE — Progress Notes (Deleted)
     Established patient visit   Patient: Sara Munoz   DOB: 1966-02-03   54 y.o. Female  MRN: [id number] Visit Date: 11/16/2019  Today's healthcare provider: [name], [name]   No chief complaint on file.  Subjective    HPI   {Show patient history (optional):23778::" "}   Medications: Outpatient Medications Prior to Visit  Medication Sig  . albuterol (PROVENTIL HFA;VENTOLIN HFA) 108 (90 Base) MCG/ACT inhaler Inhale 2 puffs into the lungs every 6 (six) hours as needed for wheezing or shortness of breath.  Marland Kitchen albuterol (PROVENTIL) (2.5 MG/3ML) 0.083% nebulizer solution Take 3 mLs (2.5 mg total) by nebulization every 6 (six) hours as needed for wheezing or shortness of breath.  Marland Kitchen atorvastatin (LIPITOR) 20 MG tablet Take 1 tablet (20 mg total) by mouth daily.  . budesonide-formoterol (SYMBICORT) 160-4.5 MCG/ACT inhaler Inhale 2 puffs into the lungs 2 (two) times daily.  Marland Kitchen docusate sodium (COLACE) 100 MG capsule Take 200 mg by mouth 2 (two) times daily as needed for mild constipation.  . DULoxetine (CYMBALTA) 60 MG capsule Take 1 capsule (60 mg total) by mouth daily. Please call [phone] to schedule appt or may request future refills from PCP.  Marland Kitchen gabapentin (NEURONTIN) 600 MG tablet Take 600 mg by mouth 3 (three) times daily.  Marland Kitchen lisinopril (PRINIVIL,ZESTRIL) 40 MG tablet Take 1 tablet (40 mg total) by mouth daily.  . metoprolol tartrate (LOPRESSOR) 25 MG tablet TAKE 1 TABLET BY MOUTH TWICE A DAY  . Naloxone HCl (NARCAN IJ) Inject as directed as needed.  . nitroGLYCERIN (NITROSTAT) 0.4 MG SL tablet Place 1 tablet (0.4 mg total) under the tongue every 5 (five) minutes x 3 doses as needed for chest pain. (Patient not taking: Reported on 08/10/2019)  . omeprazole (PRILOSEC) 40 MG capsule Take 1 capsule (40 mg total) by mouth 2 (two) times daily.  . ondansetron (ZOFRAN-ODT) 8 MG disintegrating tablet TAKE 1 TABLET BY MOUTH 2 TIMES DAILY PLEASE CALL [phone] TO SCHEDULE APPT FOR  CONTINUED REFILLS.  Marland Kitchen Oxycodone HCl 10 MG TABS Take 10 mg by mouth 6 (six) times daily.   . rizatriptan (MAXALT-MLT) 10 MG disintegrating tablet Take 1 tab at onset of migraine.  May repeat in 2 hrs, if needed.  Max dose: 2 tabs/day. This is a 30 day prescription.  Please call [phone] to schedule appt or may request future refills from PCP.  Marland Kitchen sucralfate (CARAFATE) 1 GM/10ML suspension Take 10 mLs (1 g total) by mouth 4 (four) times daily.  . Tiotropium Bromide Monohydrate (SPIRIVA RESPIMAT) 2.5 MCG/ACT AERS Inhale 1 puff into the lungs daily.  Marland Kitchen tiZANidine (ZANAFLEX) 4 MG tablet Take 4 mg by mouth 3 (three) times daily.    No facility-administered medications prior to visit.    Review of Systems  Constitutional: Negative.   Respiratory: Negative.   Cardiovascular: Negative.   Hematological: Negative.     {Heme  Chem  Endocrine  Serology  Results Review (optional):23779::" "}  Objective    LMP 12/16/2017 (Approximate)  {Show previous vital signs (optional):23777::" "}  Physical Exam  ***  No results found for any visits on 11/16/19.  Assessment & Plan     ***  No follow-ups on file.      {provider attestation***:1}   Paulene Floor  [facility] [phone] (phone) [phone] (fax)  [place]

## 2019-11-15 NOTE — Progress Notes (Deleted)
PATIENT: Sara Munoz DOB: June 01, 1966  REASON FOR VISIT: follow up HISTORY FROM: patient  HISTORY OF PRESENT ILLNESS: Today 11/15/19  HISTORY  Sara Munoz is a 54 year old female, seen in refer by primary care PA Carles Collet for evaluation of paresthesia, persistent headaches, initial evaluation was on August 29, 2017.  I reviewed and summarized the referring note, she had hypertension, hyperlipidemia, COPD, reported coronary artery disease, heart attack, stroke in the past, hepatitis C, is receiving treatment, will finish in March 2019, she has been on disability.  On polypharmacy treatment.  Since summer 2018, she reported gradual onset worsening neck pain, radiating paresthesia along her spine and 4 extremity with neck movement, gradually getting worse over the past few months, also noticed spreading of neck pain to her vortex area, almost daily pressure headaches, she did have a history of migraine in the past, but this headaches are persistent, moderate degree on a daily basis.  She has tried aspirin, NSAIDs with limited help, she denies bowel and bladder incontinence, no significant gait abnormality.  She reported few episode of passing out since 2018, most recent one in January 2019, woke up in the middle of the night using bathroom, no warning signs, woke up on the floor, with bruising her face, whole body achy pain, urinary incontinence.  Reviewed CT head without contrast November 2018, no acute abnormality, CT cervical spine showed multilevel degenerative disc disease, with mild to moderate canal stenosis C6-C7.  Laboratory evaluation seen November 2018, normal CBC, BMP.  UPDATE October 01 2017: Patient complains 6 months history of constant lower neck pain, radiating along her spine, often spreading forward, headache, she is under pain management at [facility], is planning on to have cervical epidural injection, also on narcotic treatment,  I have  personally reviewed MRI of the brain, no acute intracranial abnormality, mild supratentorium small vessel disease.  MRI of cervical spine, central disc protrusion at C6 and 7, with mild to moderate canal stenosis, cord flattening, canal diameter 6 mm, but no cord signal change, and also moderate bilateral foraminal narrowing,  Electrodiagnostic study October 01 2017 Showed Mild Right Carpal Tunl. syndromes, there is no significant cervical radiculopathies,  On examinations, there is no evidence of muscle weakness, or cervical myelopathy.  UPDATE February 12 2018: She continue complains of significant daily severe neck pain, radiating pain to bilateral shoulder, more to right side, has right arm numbness tingling, her neck pain also spreading forward to become the headache with associated light noise sensitivity, she also complains of imbalance, bladder urgency,  We personally reviewed MRI of the cervical spine in March 2019, central disc protrusion at C6-7, with moderate canal stenosis, flattening of the spinal cord, canal diameter 6 mm, no cord abnormal signal changes, left greater than right C7 foraminal narrowing,  MRI of the brain showed no acute intracranial abnormality  EMG nerve conduction study in April 2019 showed mild bilateral C7 radiculopathy, mild right carpal tunnel syndrome.  laboratory evaluations in July 2019, negative troponin, mild elevated WBC 12.8, hemoglobin of 13.6, BMP showed low potassium 3.1, creatinine of 0.7,  Over the years she has tried different medications including gabapentin 600 mg 3 times a day, is under pain management, taking oxycodone without controlling her neck pain, frequent headaches,  Update Nov 15, 2019 SS: She has not been seen since August 2019    REVIEW OF SYSTEMS: Out of a complete 14 system review of symptoms, the patient complains only of the following symptoms,  and all other reviewed systems are negative.  ALLERGIES: Allergies    Allergen Reactions  . Flexeril [Cyclobenzaprine] Hives and Swelling    Facial/lip swelling     . Levofloxacin Hives and Swelling  . Ketorolac   . Phenergan [Promethazine Hcl] Other (See Comments)    Agitation.   . Toradol [Ketorolac Tromethamine] Swelling and Other (See Comments)    Reaction:  Facial/tongue swelling  Reaction:  Facial/tongue swelling   . Tramadol Hives, Swelling and Other (See Comments)    Reaction:  Lip swelling  Reaction:  Lip swelling   . Zoloft [Sertraline Hcl] Swelling    Tongue swelling       HOME MEDICATIONS: Outpatient Medications Prior to Visit  Medication Sig Dispense Refill  . albuterol (PROVENTIL HFA;VENTOLIN HFA) 108 (90 Base) MCG/ACT inhaler Inhale 2 puffs into the lungs every 6 (six) hours as needed for wheezing or shortness of breath. 1 Inhaler 2  . albuterol (PROVENTIL) (2.5 MG/3ML) 0.083% nebulizer solution Take 3 mLs (2.5 mg total) by nebulization every 6 (six) hours as needed for wheezing or shortness of breath. 75 mL 12  . atorvastatin (LIPITOR) 20 MG tablet Take 1 tablet (20 mg total) by mouth daily. 90 tablet 3  . budesonide-formoterol (SYMBICORT) 160-4.5 MCG/ACT inhaler Inhale 2 puffs into the lungs 2 (two) times daily. 1 Inhaler 0  . docusate sodium (COLACE) 100 MG capsule Take 200 mg by mouth 2 (two) times daily as needed for mild constipation.    . DULoxetine (CYMBALTA) 60 MG capsule Take 1 capsule (60 mg total) by mouth daily. Please call [phone] to schedule appt or may request future refills from PCP. 30 capsule 2  . gabapentin (NEURONTIN) 600 MG tablet Take 600 mg by mouth 3 (three) times daily.    Marland Kitchen lisinopril (PRINIVIL,ZESTRIL) 40 MG tablet Take 1 tablet (40 mg total) by mouth daily. 90 tablet 1  . metoprolol tartrate (LOPRESSOR) 25 MG tablet TAKE 1 TABLET BY MOUTH TWICE A DAY 180 tablet 0  . Naloxone HCl (NARCAN IJ) Inject as directed as needed.    . nitroGLYCERIN (NITROSTAT) 0.4 MG SL tablet Place 1 tablet (0.4 mg total) under the  tongue every 5 (five) minutes x 3 doses as needed for chest pain. (Patient not taking: Reported on 08/10/2019) 30 tablet 2  . omeprazole (PRILOSEC) 40 MG capsule Take 1 capsule (40 mg total) by mouth 2 (two) times daily. 90 capsule 3  . ondansetron (ZOFRAN-ODT) 8 MG disintegrating tablet TAKE 1 TABLET BY MOUTH 2 TIMES DAILY PLEASE CALL [phone] TO SCHEDULE APPT FOR CONTINUED REFILLS. 20 tablet 0  . Oxycodone HCl 10 MG TABS Take 10 mg by mouth 6 (six) times daily.     . rizatriptan (MAXALT-MLT) 10 MG disintegrating tablet Take 1 tab at onset of migraine.  May repeat in 2 hrs, if needed.  Max dose: 2 tabs/day. This is a 30 day prescription.  Please call [phone] to schedule appt or may request future refills from PCP. 9 tablet 2  . sucralfate (CARAFATE) 1 GM/10ML suspension Take 10 mLs (1 g total) by mouth 4 (four) times daily. 1200 mL 2  . Tiotropium Bromide Monohydrate (SPIRIVA RESPIMAT) 2.5 MCG/ACT AERS Inhale 1 puff into the lungs daily. 4 g 3  . tiZANidine (ZANAFLEX) 4 MG tablet Take 4 mg by mouth 3 (three) times daily.      No facility-administered medications prior to visit.    PAST MEDICAL HISTORY: Past Medical History:  Diagnosis Date  . Anxiety   .  Asthma   . Atypical chest pain 08/02/2015  . CAD (coronary artery disease)   . Chronic back pain   . COPD (chronic obstructive pulmonary disease) ([place])   . Coronary artery disease    a. Mild-non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/2014  . GERD (gastroesophageal reflux disease)   . Hypercholesteremia   . Hypertension   . Hypokalemia   . Hypomagnesemia 01/04/2014  . Liver disease   . MI (myocardial infarction) ([place])   . Opiate use 02/27/2016  . Osteoarthritis   . Stroke [facility])     PAST SURGICAL HISTORY: Past Surgical History:  Procedure Laterality Date  . ANTERIOR CERVICAL DECOMP/DISCECTOMY FUSION N/A 03/30/2018   Procedure: Cervical six-seven Anterior cervical decompression/discectomy/fusion;  Surgeon: Judith Part, MD;  Location: [place];  Service: Neurosurgery;  Laterality: N/A;  . ANTERIOR CERVICAL DECOMP/DISCECTOMY FUSION N/A 01/11/2019   Procedure: Cervical Five-Six Anterior cervical discectomy fusion,  Cervical Five to Cervical Seven anterior instrumented fusion;  Surgeon: Judith Part, MD;  Location: [place];  Service: Neurosurgery;  Laterality: N/A;  Cervical Five-Six Anterior cervical discectomy fusion,  Cervical Five to Cervical Seven anterior instrumented fusion  . BACK SURGERY    . CARDIAC CATHETERIZATION Left 02/22/2016   Procedure: Left Heart Cath and Coronary Angiography;  Surgeon: Yolonda Kida, MD;  Location: [place] CV [facility];  Service: Cardiovascular;  Laterality: Left;  . COLONOSCOPY WITH PROPOFOL N/A 09/19/2016   Procedure: COLONOSCOPY WITH PROPOFOL;  Surgeon: [name], MD;  Location: [facility] ENDOSCOPY;  Service: Endoscopy;  Laterality: N/A;  . DIAGNOSTIC LAPAROSCOPY    . ESOPHAGOGASTRODUODENOSCOPY (EGD) WITH PROPOFOL N/A 09/18/2017   Procedure: ESOPHAGOGASTRODUODENOSCOPY (EGD) WITH PROPOFOL;  Surgeon: [name], MD;  Location[facility] ENDOSCOPY;  Service: Gastroenterology;  Laterality: N/A;  . ESOPHAGOGASTRODUODENOSCOPY (EGD) WITH PROPOFOL N/A 08/10/2019   Procedure: ESOPHAGOGASTRODUODENOSCOPY (EGD) WITH PROPOFOL;  Surgeon: [name], MD;  Location: [facility] ENDOSCOPY;  Service: Endoscopy;  Laterality: N/A;  . FRACTURE SURGERY    . hemorroids    . NASAL SINUS SURGERY    . ORIF FEMUR FRACTURE    . ORIF TIBIA & FIBULA FRACTURES    . OVARY SURGERY      FAMILY HISTORY: Family History  Problem Relation Age of Onset  . Heart disease Mother   . Lung cancer Mother   . Ovarian cancer Mother   . Healthy Brother   . Healthy Brother   . Healthy Brother   . Diabetes Maternal Uncle   . Breast cancer Maternal Aunt     SOCIAL HISTORY: Social History   Socioeconomic History  . Marital status: Widowed    Spouse name: Not on file  . Number of children: 0  . Years of education: Not on file  . Highest  education level: 9th grade  Occupational History  . Occupation: disabled  Tobacco Use  . Smoking status: Current Every Day Smoker    Packs/day: 1.00    Years: 35.00    Pack years: 35.00    Types: Cigarettes  . Smokeless tobacco: Never Used  . Tobacco comment: 07/2019- 1.5 a day  Substance and Sexual Activity  . Alcohol use: Not Currently    Alcohol/week: 0.0 standard drinks  . Drug use: Not Currently    Types: Marijuana  . Sexual activity: Yes    Birth control/protection: None  Other Topics Concern  . Not on file  Social History Narrative   Moved from [place].   Social Determinants of Health   Financial Resource Strain:   .  Difficulty of Paying Living Expenses:   Food Insecurity:   . Worried About Charity fundraiser in the Last Year:   . Arboriculturist in the Last Year:   Transportation Needs:   . Film/video editor (Medical):   Marland Kitchen Lack of Transportation (Non-Medical):   Physical Activity:   . Days of Exercise per Week:   . Minutes of Exercise per Session:   Stress:   . Feeling of Stress :   Social Connections:   . Frequency of Communication with Friends and Family:   . Frequency of Social Gatherings with Friends and Family:   . Attends Religious Services:   . Active Member of Clubs or Organizations:   . Attends Archivist Meetings:   Marland Kitchen Marital Status:   Intimate Partner Violence:   . Fear of Current or Ex-Partner:   . Emotionally Abused:   Marland Kitchen Physically Abused:   . Sexually Abused:       PHYSICAL EXAM  There were no vitals filed for this visit. There is no height or weight on file to calculate BMI.  Generalized: Well developed, in no acute distress   Neurological examination  Mentation: Alert oriented to time, place, history taking. Follows all commands speech and language fluent Cranial nerve II-XII: Pupils were equal round reactive to light. Extraocular movements were full, visual field were full on confrontational test. Facial  sensation and strength were normal. Uvula tongue midline. Head turning and shoulder shrug  were normal and symmetric. Motor: The motor testing reveals 5 over 5 strength of all 4 extremities. Good symmetric motor tone is noted throughout.  Sensory: Sensory testing is intact to soft touch on all 4 extremities. No evidence of extinction is noted.  Coordination: Cerebellar testing reveals good finger-nose-finger and heel-to-shin bilaterally.  Gait and station: Gait is normal. Tandem gait is normal. Romberg is negative. No drift is seen.  Reflexes: Deep tendon reflexes are symmetric and normal bilaterally.   DIAGNOSTIC DATA (LABS, IMAGING, TESTING) - I reviewed patient records, labs, notes, testing and imaging myself where available.  Lab Results  Component Value Date   WBC 7.0 08/11/2019   HGB 13.7 08/11/2019   HCT 40.3 08/11/2019   MCV 85 08/11/2019   PLT 250 08/11/2019      Component Value Date/Time   NA 139 08/11/2019 1354   K 4.1 08/11/2019 1354   CL 102 08/11/2019 1354   CO2 22 08/11/2019 1354   GLUCOSE 70 08/11/2019 1354   GLUCOSE 92 01/07/2019 1450   BUN 8 08/11/2019 1354   CREATININE 0.92 08/11/2019 1354   CALCIUM 9.5 08/11/2019 1354   PROT 7.0 08/11/2019 1354   ALBUMIN 4.3 08/11/2019 1354   AST 17 08/11/2019 1354   ALT 12 08/11/2019 1354   ALKPHOS 89 08/11/2019 1354   BILITOT <0.2 08/11/2019 1354   GFRNONAA 72 08/11/2019 1354   GFRAA 83 08/11/2019 1354   Lab Results  Component Value Date   CHOL 251 (H) 02/20/2018   HDL 46 02/20/2018   LDLCALC 161 (H) 02/20/2018   TRIG 220 (H) 02/20/2018   CHOLHDL 5.5 (H) 02/20/2018   Lab Results  Component Value Date   HGBA1C 5.9 (H) 08/11/2019   Lab Results  Component Value Date   VITAMINB12 491 03/01/2016   Lab Results  Component Value Date   TSH 2.166 08/22/2016      ASSESSMENT AND PLAN 54 y.o. year old female  has a past medical history of Anxiety, Asthma, Atypical chest pain (  08/02/2015), CAD (coronary artery  disease), Chronic back pain, COPD (chronic obstructive pulmonary disease) ([place]), Coronary artery disease, GERD (gastroesophageal reflux disease), Hypercholesteremia, Hypertension, Hypokalemia, Hypomagnesemia (01/04/2014), Liver disease, MI (myocardial infarction) ([place]), Opiate use (02/27/2016), Osteoarthritis, and Stroke ([place]). here with:  1.  Cervical spondylitic disease 2.  Chronic neck pain 3.  Persistent daily headaches -When last seen in August 2019, was referred to neurosurgery for evaluation -In July 2020, had anterior cervical discectomy and fusion C5-6 with Dr Zada Finders -MRI of cervical spine showed moderate C6-7 canal stenosis, canal diameter of 6 mm, cord flattening -Tried and failed NSAIDs, gabapentin, physical therapy, epidural injection, chronic narcotic pain management   I spent 15 minutes with the patient. 50% of this time was spent   Butler Denmark, [place]on, DNP 11/15/2019, 5:32 AM [facility] [address] [place], [place] [address] [phone]

## 2019-11-16 ENCOUNTER — Ambulatory Visit: Payer: Medicaid Other | Admitting: Physician Assistant

## 2019-11-17 ENCOUNTER — Encounter: Payer: Self-pay | Admitting: Emergency Medicine

## 2019-11-17 ENCOUNTER — Other Ambulatory Visit: Payer: Self-pay

## 2019-11-17 ENCOUNTER — Emergency Department: Payer: Medicaid Other

## 2019-11-17 ENCOUNTER — Telehealth: Payer: Self-pay | Admitting: Emergency Medicine

## 2019-11-17 ENCOUNTER — Emergency Department
Admission: EM | Admit: 2019-11-17 | Discharge: 2019-11-17 | Disposition: A | Discharge status: Home / Self Care | Payer: Medicaid Other | Attending: Emergency Medicine | Admitting: Emergency Medicine

## 2019-11-17 DIAGNOSIS — Z5321 Procedure and treatment not carried out due to patient leaving prior to being seen by health care provider: Secondary | ICD-10-CM | POA: Insufficient documentation

## 2019-11-17 DIAGNOSIS — R0602 Shortness of breath: Secondary | ICD-10-CM | POA: Diagnosis not present

## 2019-11-17 DIAGNOSIS — R0789 Other chest pain: Secondary | ICD-10-CM | POA: Diagnosis not present

## 2019-11-17 DIAGNOSIS — R079 Chest pain, unspecified: Secondary | ICD-10-CM | POA: Diagnosis not present

## 2019-11-17 LAB — BASIC METABOLIC PANEL
Anion gap: 11 (ref 5–15)
BUN: 11 mg/dL (ref 6–20)
CO2: 24 mmol/L (ref 22–32)
Calcium: 9.1 mg/dL (ref 8.9–10.3)
Chloride: 104 mmol/L (ref 98–111)
Creatinine, Ser: 0.84 mg/dL (ref 0.44–1.00)
GFR calc Af Amer: >60 mL/min (ref >60)
GFR calc non Af Amer: >60 mL/min (ref >60)
Glucose, Bld: 109 mg/dL — ABNORMAL HIGH (ref 70–99)
Potassium: 3.6 mmol/L (ref 3.5–5.1)
Sodium: 139 mmol/L (ref 135–145)

## 2019-11-17 LAB — CBC
HCT: 42.2 % (ref 36.0–46.0)
Hemoglobin: 13.8 g/dL (ref 12.0–15.0)
MCH: 26.4 pg (ref 26.0–34.0)
MCHC: 32.7 g/dL (ref 30.0–36.0)
MCV: 80.8 fL (ref 80.0–100.0)
Platelets: 253 10*3/uL (ref 150–400)
RBC: 5.22 MIL/uL — ABNORMAL HIGH (ref 3.87–5.11)
RDW: 16.1 % — ABNORMAL HIGH (ref 11.5–15.5)
WBC: 9 10*3/uL (ref 4.0–10.5)
nRBC: 0 % (ref 0.0–0.2)

## 2019-11-17 LAB — TROPONIN I (HIGH SENSITIVITY): Troponin I (High Sensitivity): 3 ng/L (ref <18)

## 2019-11-17 NOTE — ED Triage Notes (Signed)
Pt reports CP, SOB and weakness for the last few days. Pt describes her CP as sharp in nature.

## 2019-11-17 NOTE — Telephone Encounter (Signed)
Called patient due to lwot to inquire about condition and follow up plans. No answer and mailbox is full. 

## 2019-11-25 DIAGNOSIS — M461 Sacroiliitis, not elsewhere classified: Secondary | ICD-10-CM | POA: Diagnosis not present

## 2019-12-03 DIAGNOSIS — M79661 Pain in right lower leg: Secondary | ICD-10-CM | POA: Diagnosis not present

## 2019-12-03 DIAGNOSIS — M79662 Pain in left lower leg: Secondary | ICD-10-CM | POA: Diagnosis not present

## 2019-12-03 DIAGNOSIS — M79622 Pain in left upper arm: Secondary | ICD-10-CM | POA: Diagnosis not present

## 2019-12-03 DIAGNOSIS — M79621 Pain in right upper arm: Secondary | ICD-10-CM | POA: Diagnosis not present

## 2019-12-06 DIAGNOSIS — H2511 Age-related nuclear cataract, right eye: Secondary | ICD-10-CM | POA: Diagnosis not present

## 2019-12-06 DIAGNOSIS — H2513 Age-related nuclear cataract, bilateral: Secondary | ICD-10-CM | POA: Diagnosis not present

## 2019-12-06 DIAGNOSIS — H25013 Cortical age-related cataract, bilateral: Secondary | ICD-10-CM | POA: Diagnosis not present

## 2019-12-06 DIAGNOSIS — H25043 Posterior subcapsular polar age-related cataract, bilateral: Secondary | ICD-10-CM | POA: Diagnosis not present

## 2019-12-06 DIAGNOSIS — H52213 Irregular astigmatism, bilateral: Secondary | ICD-10-CM | POA: Diagnosis not present

## 2019-12-06 DIAGNOSIS — H40023 Open angle with borderline findings, high risk, bilateral: Secondary | ICD-10-CM | POA: Diagnosis not present

## 2019-12-14 ENCOUNTER — Ambulatory Visit: Payer: Medicaid Other | Admitting: Neurology

## 2019-12-14 DIAGNOSIS — H25811 Combined forms of age-related cataract, right eye: Secondary | ICD-10-CM | POA: Diagnosis not present

## 2019-12-14 DIAGNOSIS — H2511 Age-related nuclear cataract, right eye: Secondary | ICD-10-CM | POA: Diagnosis not present

## 2019-12-22 DIAGNOSIS — M5416 Radiculopathy, lumbar region: Secondary | ICD-10-CM | POA: Diagnosis not present

## 2019-12-22 DIAGNOSIS — Z6831 Body mass index (BMI) 31.0-31.9, adult: Secondary | ICD-10-CM | POA: Diagnosis not present

## 2019-12-29 ENCOUNTER — Other Ambulatory Visit: Payer: Self-pay | Admitting: Physician Assistant

## 2019-12-29 DIAGNOSIS — I10 Essential (primary) hypertension: Secondary | ICD-10-CM

## 2020-01-13 ENCOUNTER — Ambulatory Visit: Payer: Self-pay | Admitting: Neurology

## 2020-01-13 ENCOUNTER — Encounter: Payer: Self-pay | Admitting: Neurology

## 2020-01-13 NOTE — Progress Notes (Deleted)
PATIENT: Sara Munoz DOB: 1966-01-14  REASON FOR VISIT: follow up HISTORY FROM: patient  HISTORY OF PRESENT ILLNESS: Today 01/13/20  HISTORY Sara Munoz is a 54 year old female, seen in refer by primary care PA Carles Collet for evaluation of paresthesia, persistent headaches, initial evaluation was on August 29, 2017.  I reviewed and summarized the referring note, she had hypertension, hyperlipidemia, COPD, reported coronary artery disease, heart attack, stroke in the past, hepatitis C, is receiving treatment, will finish in March 2019, she has been on disability.  On polypharmacy treatment.  Since summer 2018, she reported gradual onset worsening neck pain, radiating paresthesia along her spine and 4 extremity with neck movement, gradually getting worse over the past few months, also noticed spreading of neck pain to her vortex area, almost daily pressure headaches, she did have a history of migraine in the past, but this headaches are persistent, moderate degree on a daily basis.  She has tried aspirin, NSAIDs with limited help, she denies bowel and bladder incontinence, no significant gait abnormality.  She reported few episode of passing out since 2018, most recent one in January 2019, woke up in the middle of the night using bathroom, no warning signs, woke up on the floor, with bruising her face, whole body achy pain, urinary incontinence.  Reviewed CT head without contrast November 2018, no acute abnormality, CT cervical spine showed multilevel degenerative disc disease, with mild to moderate canal stenosis C6-C7.  Laboratory evaluation seen November 2018, normal CBC, BMP.  UPDATE October 01 2017: Patient complains 6 months history of constant lower neck pain, radiating along her spine, often spreading forward, headache, she is under pain management at [facility], is planning on to have cervical epidural injection, also on narcotic treatment,  I have  personally reviewed MRI of the brain, no acute intracranial abnormality, mild supratentorium small vessel disease.  MRI of cervical spine, central disc protrusion at C6 and 7, with mild to moderate canal stenosis, cord flattening, canal diameter 6 mm, but no cord signal change, and also moderate bilateral foraminal narrowing,  Electrodiagnostic study October 01 2017 Showed Mild Right Carpal Tunl. syndromes, there is no significant cervical radiculopathies,  On examinations, there is no evidence of muscle weakness, or cervical myelopathy.  UPDATE February 12 2018: She continue complains of significant daily severe neck pain, radiating pain to bilateral shoulder, more to right side, has right arm numbness tingling, her neck pain also spreading forward to become the headache with associated light noise sensitivity, she also complains of imbalance, bladder urgency,  We personally reviewed MRI of the cervical spine in March 2019, central disc protrusion at C6-7, with moderate canal stenosis, flattening of the spinal cord, canal diameter 6 mm, no cord abnormal signal changes, left greater than right C7 foraminal narrowing,  MRI of the brain showed no acute intracranial abnormality  EMG nerve conduction study in April 2019 showed mild bilateral C7 radiculopathy, mild right carpal tunnel syndrome.  laboratory evaluations in July 2019, negative troponin, mild elevated WBC 12.8, hemoglobin of 13.6, BMP showed low potassium 3.1, creatinine of 0.7,  Over the years she has tried different medications including gabapentin 600 mg 3 times a day, is under pain management, taking oxycodone without controlling her neck pain, frequent headaches,   Update January 13, 2020 SS: When last seen in August 2019, referred for neurosurgical evaluation for chronic neck pain. July 2020 had C5-6 ACDF with Dr. Zada Finders  REVIEW OF SYSTEMS: Out of a complete 14  system review of symptoms, the patient complains only of the  following symptoms, and all other reviewed systems are negative.  ALLERGIES: Allergies  Allergen Reactions  . Flexeril [Cyclobenzaprine] Hives and Swelling    Facial/lip swelling     . Levofloxacin Hives and Swelling  . Ketorolac   . Phenergan [Promethazine Hcl] Other (See Comments)    Agitation.   . Toradol [Ketorolac Tromethamine] Swelling and Other (See Comments)    Reaction:  Facial/tongue swelling  Reaction:  Facial/tongue swelling   . Tramadol Hives, Swelling and Other (See Comments)    Reaction:  Lip swelling  Reaction:  Lip swelling   . Zoloft [Sertraline Hcl] Swelling    Tongue swelling       HOME MEDICATIONS: Outpatient Medications Prior to Visit  Medication Sig Dispense Refill  . albuterol (PROVENTIL HFA;VENTOLIN HFA) 108 (90 Base) MCG/ACT inhaler Inhale 2 puffs into the lungs every 6 (six) hours as needed for wheezing or shortness of breath. 1 Inhaler 2  . albuterol (PROVENTIL) (2.5 MG/3ML) 0.083% nebulizer solution Take 3 mLs (2.5 mg total) by nebulization every 6 (six) hours as needed for wheezing or shortness of breath. 75 mL 12  . atorvastatin (LIPITOR) 20 MG tablet Take 1 tablet (20 mg total) by mouth daily. 90 tablet 3  . budesonide-formoterol (SYMBICORT) 160-4.5 MCG/ACT inhaler Inhale 2 puffs into the lungs 2 (two) times daily. 1 Inhaler 0  . docusate sodium (COLACE) 100 MG capsule Take 200 mg by mouth 2 (two) times daily as needed for mild constipation.    . DULoxetine (CYMBALTA) 60 MG capsule Take 1 capsule (60 mg total) by mouth daily. Please call [phone] to schedule appt or may request future refills from PCP. 30 capsule 2  . gabapentin (NEURONTIN) 600 MG tablet Take 600 mg by mouth 3 (three) times daily.    Marland Kitchen lisinopril (PRINIVIL,ZESTRIL) 40 MG tablet Take 1 tablet (40 mg total) by mouth daily. 90 tablet 1  . metoprolol tartrate (LOPRESSOR) 25 MG tablet TAKE 1 TABLET BY MOUTH TWICE A DAY 120 tablet 0  . Naloxone HCl (NARCAN IJ) Inject as directed as needed.     . nitroGLYCERIN (NITROSTAT) 0.4 MG SL tablet Place 1 tablet (0.4 mg total) under the tongue every 5 (five) minutes x 3 doses as needed for chest pain. (Patient not taking: Reported on 08/10/2019) 30 tablet 2  . omeprazole (PRILOSEC) 40 MG capsule Take 1 capsule (40 mg total) by mouth 2 (two) times daily. 90 capsule 3  . ondansetron (ZOFRAN-ODT) 8 MG disintegrating tablet TAKE 1 TABLET BY MOUTH 2 TIMES DAILY PLEASE CALL [phone] TO SCHEDULE APPT FOR CONTINUED REFILLS. 20 tablet 0  . Oxycodone HCl 10 MG TABS Take 10 mg by mouth 6 (six) times daily.     . rizatriptan (MAXALT-MLT) 10 MG disintegrating tablet Take 1 tab at onset of migraine.  May repeat in 2 hrs, if needed.  Max dose: 2 tabs/day. This is a 30 day prescription.  Please call [phone] to schedule appt or may request future refills from PCP. 9 tablet 2  . sucralfate (CARAFATE) 1 GM/10ML suspension Take 10 mLs (1 g total) by mouth 4 (four) times daily. 1200 mL 2  . Tiotropium Bromide Monohydrate (SPIRIVA RESPIMAT) 2.5 MCG/ACT AERS Inhale 1 puff into the lungs daily. 4 g 3  . tiZANidine (ZANAFLEX) 4 MG tablet Take 4 mg by mouth 3 (three) times daily.      No facility-administered medications prior to visit.    PAST MEDICAL HISTORY:  Past Medical History:  Diagnosis Date  . Anxiety   . Asthma   . Atypical chest pain 08/02/2015  . CAD (coronary artery disease)   . Chronic back pain   . COPD (chronic obstructive pulmonary disease) ([place])   . Coronary artery disease    a. Mild-non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/2014  . GERD (gastroesophageal reflux disease)   . Hypercholesteremia   . Hypertension   . Hypokalemia   . Hypomagnesemia 01/04/2014  . Liver disease   . MI (myocardial infarction) ([place])   . Opiate use 02/27/2016  . Osteoarthritis   . Stroke [facility])     PAST SURGICAL HISTORY: Past Surgical History:  Procedure Laterality Date  . ANTERIOR CERVICAL DECOMP/DISCECTOMY FUSION N/A 03/30/2018   Procedure: Cervical six-seven Anterior  cervical decompression/discectomy/fusion;  Surgeon: Judith Part, MD;  Location: [facility] [place];  Service: Neurosurgery;  Laterality: N/A;  . ANTERIOR CERVICAL DECOMP/DISCECTOMY FUSION N/A 01/11/2019   Procedure: Cervical Five-Six Anterior cervical discectomy fusion,  Cervical Five to Cervical Seven anterior instrumented fusion;  Surgeon: Judith Part, MD;  Location: [place];  Service: Neurosurgery;  Laterality: N/A;  Cervical Five-Six Anterior cervical discectomy fusion,  Cervical Five to Cervical Seven anterior instrumented fusion  . BACK SURGERY    . CARDIAC CATHETERIZATION Left 02/22/2016   Procedure: Left Heart Cath and Coronary Angiography;  Surgeon: Yolonda Kida, MD;  Location: [place] [facility];  Service: Cardiovascular;  Laterality: Left;  . COLONOSCOPY WITH PROPOFOL N/A 09/19/2016   Procedure: COLONOSCOPY WITH PROPOFOL;  Surgeon: [name], MD;  Location: [facility] ENDOSCOPY;  Service: Endoscopy;  Laterality: N/A;  . DIAGNOSTIC LAPAROSCOPY    . ESOPHAGOGASTRODUODENOSCOPY (EGD) WITH PROPOFOL N/A 09/18/2017   Procedure: ESOPHAGOGASTRODUODENOSCOPY (EGD) WITH PROPOFOL;  Surgeon: [name], MD;  Location[facility];  Service: Gastroenterology;  Laterality: N/A;  . ESOPHAGOGASTRODUODENOSCOPY (EGD) WITH PROPOFOL N/A 08/10/2019   Procedure: ESOPHAGOGASTRODUODENOSCOPY (EGD) WITH PROPOFOL;  Surgeon: [name], MD;  Location: [facility] ENDOSCOPY;  Service: Endoscopy;  Laterality: N/A;  . FRACTURE SURGERY    . hemorroids    . NASAL SINUS SURGERY    . ORIF FEMUR FRACTURE    . ORIF TIBIA & FIBULA FRACTURES    . OVARY SURGERY      FAMILY HISTORY: Family History  Problem Relation Age of Onset  . Heart disease Mother   . Lung cancer Mother   . Ovarian cancer Mother   . Healthy Brother   . Healthy Brother   . Healthy Brother   . Diabetes Maternal Uncle   . Breast cancer Maternal Aunt     SOCIAL HISTORY: Social History   Socioeconomic History  . Marital status: Widowed    Spouse  name: Not on file  . Number of children: 0  . Years of education: Not on file  . Highest education level: 9th grade  Occupational History  . Occupation: disabled  Tobacco Use  . Smoking status: Current Every Day Smoker    Packs/day: 1.00    Years: 35.00    Pack years: 35.00    Types: Cigarettes  . Smokeless tobacco: Never Used  . Tobacco comment: 07/2019- 1.5 a day  Vaping Use  . Vaping Use: Former  Substance and Sexual Activity  . Alcohol use: Not Currently    Alcohol/week: 0.0 standard drinks  . Drug use: Not Currently    Types: Marijuana  . Sexual activity: Yes    Birth control/protection: None  Other Topics Concern  . Not on file  Social  History Narrative   Moved from [place].   Social Determinants of Health   Financial Resource Strain:   . Difficulty of Paying Living Expenses:   Food Insecurity:   . Worried About Charity fundraiser in the Last Year:   . Arboriculturist in the Last Year:   Transportation Needs:   . Film/video editor (Medical):   Marland Kitchen Lack of Transportation (Non-Medical):   Physical Activity:   . Days of Exercise per Week:   . Minutes of Exercise per Session:   Stress:   . Feeling of Stress :   Social Connections:   . Frequency of Communication with Friends and Family:   . Frequency of Social Gatherings with Friends and Family:   . Attends Religious Services:   . Active Member of Clubs or Organizations:   . Attends Archivist Meetings:   Marland Kitchen Marital Status:   Intimate Partner Violence:   . Fear of Current or Ex-Partner:   . Emotionally Abused:   Marland Kitchen Physically Abused:   . Sexually Abused:       PHYSICAL EXAM  There were no vitals filed for this visit. There is no height or weight on file to calculate BMI.  Generalized: Well developed, in no acute distress   Neurological examination  Mentation: Alert oriented to time, place, history taking. Follows all commands speech and language fluent Cranial nerve II-XII: Pupils  were equal round reactive to light. Extraocular movements were full, visual field were full on confrontational test. Facial sensation and strength were normal. Uvula tongue midline. Head turning and shoulder shrug  were normal and symmetric. Motor: The motor testing reveals 5 over 5 strength of all 4 extremities. Good symmetric motor tone is noted throughout.  Sensory: Sensory testing is intact to soft touch on all 4 extremities. No evidence of extinction is noted.  Coordination: Cerebellar testing reveals good finger-nose-finger and heel-to-shin bilaterally.  Gait and station: Gait is normal. Tandem gait is normal. Romberg is negative. No drift is seen.  Reflexes: Deep tendon reflexes are symmetric and normal bilaterally.   DIAGNOSTIC DATA (LABS, IMAGING, TESTING) - I reviewed patient records, labs, notes, testing and imaging myself where available.  Lab Results  Component Value Date   WBC 9.0 11/17/2019   HGB 13.8 11/17/2019   HCT 42.2 11/17/2019   MCV 80.8 11/17/2019   PLT 253 11/17/2019      Component Value Date/Time   NA 139 11/17/2019 0822   NA 139 08/11/2019 1354   K 3.6 11/17/2019 0822   CL 104 11/17/2019 0822   CO2 24 11/17/2019 0822   GLUCOSE 109 (H) 11/17/2019 0822   BUN 11 11/17/2019 0822   BUN 8 08/11/2019 1354   CREATININE 0.84 11/17/2019 0822   CALCIUM 9.1 11/17/2019 0822   PROT 7.0 08/11/2019 1354   ALBUMIN 4.3 08/11/2019 1354   AST 17 08/11/2019 1354   ALT 12 08/11/2019 1354   ALKPHOS 89 08/11/2019 1354   BILITOT <0.2 08/11/2019 1354   GFRNONAA >60 11/17/2019 0822   GFRAA >60 11/17/2019 0822   Lab Results  Component Value Date   CHOL 251 (H) 02/20/2018   HDL 46 02/20/2018   LDLCALC 161 (H) 02/20/2018   TRIG 220 (H) 02/20/2018   CHOLHDL 5.5 (H) 02/20/2018   Lab Results  Component Value Date   HGBA1C 5.9 (H) 08/11/2019   Lab Results  Component Value Date   VITAMINB12 491 03/01/2016   Lab Results  Component Value Date  TSH 2.166 08/22/2016       ASSESSMENT AND PLAN 55 y.o. year old female  has a past medical history of Anxiety, Asthma, Atypical chest pain (08/02/2015), CAD (coronary artery disease), Chronic back pain, COPD (chronic obstructive pulmonary disease) ([place]), Coronary artery disease, GERD (gastroesophageal reflux disease), Hypercholesteremia, Hypertension, Hypokalemia, Hypomagnesemia (01/04/2014), Liver disease, MI (myocardial infarction) ([place]), Opiate use (02/27/2016), Osteoarthritis, and Stroke ([place]). here with:  1.  Cervical spondylitic disease 2.  Chronic neck pain 3.  Persistent daily headaches -MRI of cervical spine showed moderate C6-7 canal stenosis, canal diameter of 6 mm, cord flattening   I spent 15 minutes with the patient. 50% of this time was spent   Butler Denmark, [place], [place] 01/13/2020, 5:21 AM [facility] [address] [place], [place] [address] [phone]

## 2020-01-17 ENCOUNTER — Other Ambulatory Visit: Payer: Self-pay | Admitting: Neurological Surgery

## 2020-01-17 ENCOUNTER — Other Ambulatory Visit: Payer: Self-pay | Admitting: Gastroenterology

## 2020-01-17 DIAGNOSIS — M5416 Radiculopathy, lumbar region: Secondary | ICD-10-CM

## 2020-01-31 ENCOUNTER — Other Ambulatory Visit: Payer: Self-pay

## 2020-01-31 ENCOUNTER — Ambulatory Visit
Admission: RE | Admit: 2020-01-31 | Discharge: 2020-01-31 | Disposition: A | Discharge status: Home / Self Care | Payer: Medicaid Other | Source: Ambulatory Visit | Attending: Neurological Surgery | Admitting: Neurological Surgery

## 2020-01-31 DIAGNOSIS — M5416 Radiculopathy, lumbar region: Secondary | ICD-10-CM | POA: Insufficient documentation

## 2020-02-04 DIAGNOSIS — M5416 Radiculopathy, lumbar region: Secondary | ICD-10-CM | POA: Diagnosis not present

## 2020-02-10 ENCOUNTER — Telehealth: Payer: Self-pay | Admitting: Physician Assistant

## 2020-02-10 NOTE — Telephone Encounter (Signed)
Called patient and scheduled a MyChart visit for tomorrow, 02/11/2020.

## 2020-02-10 NOTE — Telephone Encounter (Signed)
Virtual

## 2020-02-10 NOTE — Telephone Encounter (Signed)
Pls FU with this pt. Called in stating having a lot of nausea, can not eat anything much without throwing up, pt told that with specific symptom visit would need to be virtual, pt wants permission to come in as she always has had this problem. Shared with pt I would be happy to let Dr know since she is aware of her history and she may can give clearance to make an in-person call. Pt does not like virtual appt but states that if she gets a refill of ondansetron (ZOFRAN-ODT) 8 MG disintegrating tablet Medication Date: 10/04/2019 Department: [facility] Practice Ordering/Authorizing: [name], [name]  Order Providers  She iwill not nessesarily have to come in. Pt told nurse would FU [phone]

## 2020-02-11 ENCOUNTER — Telehealth (INDEPENDENT_AMBULATORY_CARE_PROVIDER_SITE_OTHER): Payer: Medicaid Other | Admitting: Physician Assistant

## 2020-02-11 DIAGNOSIS — M5412 Radiculopathy, cervical region: Secondary | ICD-10-CM | POA: Diagnosis not present

## 2020-02-11 DIAGNOSIS — IMO0002 Reserved for concepts with insufficient information to code with codable children: Secondary | ICD-10-CM

## 2020-02-11 DIAGNOSIS — I10 Essential (primary) hypertension: Secondary | ICD-10-CM

## 2020-02-11 DIAGNOSIS — R110 Nausea: Secondary | ICD-10-CM | POA: Diagnosis not present

## 2020-02-11 DIAGNOSIS — R29818 Other symptoms and signs involving the nervous system: Secondary | ICD-10-CM | POA: Diagnosis not present

## 2020-02-11 DIAGNOSIS — E7800 Pure hypercholesterolemia, unspecified: Secondary | ICD-10-CM | POA: Diagnosis not present

## 2020-02-11 DIAGNOSIS — G43709 Chronic migraine without aura, not intractable, without status migrainosus: Secondary | ICD-10-CM

## 2020-02-11 MED ORDER — ONDANSETRON 8 MG PO TBDP: ORAL_TABLET | ORAL | 0 refills | Status: DC

## 2020-02-11 MED ORDER — AMLODIPINE BESYLATE 5 MG PO TABS: 5.0000 mg | ORAL_TABLET | Freq: Every day | ORAL | 1 refills | Status: DC

## 2020-02-11 NOTE — Progress Notes (Signed)
MyChart Video Visit    Virtual Visit via Video Note   This visit type was conducted due to national recommendations for restrictions regarding the COVID-19 Pandemic (e.g. social distancing) in an effort to limit this patient's exposure and mitigate transmission in our community. This patient is at least at moderate risk for complications without adequate follow up. This format is felt to be most appropriate for this patient at this time. Physical exam was limited by quality of the video and audio technology used for the visit.   Patient location: Home Provider location: Office   I discussed the limitations of evaluation and management by telemedicine and the availability of in person appointments. The patient expressed understanding and agreed to proceed.  Video visit was conducted for greater > 50% of the visit before technology failure forced conversion to audio only.    Patient: Sara Munoz   DOB: March 05, 1966   54 y.o. Female  MRN: [id number] Visit Date: 02/11/2020  Today's healthcare provider: [name], [name]   Chief Complaint  Patient presents with  . Nausea  I,Ashlei Chinchilla M Sadarius Norman,acting as a scribe for [name], [name].,have documented all relevant documentation on the behalf of [name], [name],as directed by  [name], [name] while in the presence of [name], [name].  Subjective    HPI   Nausea Patient presents today via MyChart for nausea. She reports that it been going on for years now but seem to have gotten worse for about 4-5 months now. Patient reports taking Zofran and states that it has helped with her nausea. Patient states she believes it maybe her medications she is on that causes her to have the nausea. She continues to have bowel movements. She is having some belly pain but this chronic issues. She recently had her oxycodone increased by pain management doctor.   Sleep Apnea  Obstructive sleep apnea is listed on her problem  list. She is having trouble falling and staying asleep. She is having fatigue throughout the day.   Hypertension, follow-up  BP Readings from Last 3 Encounters:  11/17/19 (!) 175/93  08/25/19 130/82  08/11/19 130/80   Wt Readings from Last 3 Encounters:  11/17/19 185 lb (83.9 kg)  08/25/19 191 lb 9.6 oz (86.9 kg)  08/11/19 192 lb 6.4 oz (87.3 kg)     She was last seen for hypertension 6 months ago.  BP at that visit was elevated. Management since that visit includes continue medications.  She reports excellent compliance with treatment. She is not having side effects.  She is following a Regular diet. She is not exercising. She does not smoke.  Use of agents associated with hypertension: none.   Outside blood pressures are normal. Symptoms: No chest pain No chest pressure  No palpitations No syncope  No dyspnea No orthopnea  No paroxysmal nocturnal dyspnea No lower extremity edema   Pertinent labs: Lab Results  Component Value Date   CHOL 251 (H) 02/20/2018   HDL 46 02/20/2018   LDLCALC 161 (H) 02/20/2018   TRIG 220 (H) 02/20/2018   CHOLHDL 5.5 (H) 02/20/2018   Lab Results  Component Value Date   NA 139 11/17/2019   K 3.6 11/17/2019   CREATININE 0.84 11/17/2019   GFRNONAA >60 11/17/2019   GFRAA >60 11/17/2019   GLUCOSE 109 (H) 11/17/2019     The 10-year ASCVD risk score Mikey Bussing DC Jr., et al., 2013) is: 17.8%   --------------------------------------------------------------------------------------------------- Lipid/Cholesterol, Follow-up  Last  lipid panel Other pertinent labs  Lab Results  Component Value Date   CHOL 251 (H) 02/20/2018   HDL 46 02/20/2018   LDLCALC 161 (H) 02/20/2018   TRIG 220 (H) 02/20/2018   CHOLHDL 5.5 (H) 02/20/2018   Lab Results  Component Value Date   ALT 12 08/11/2019   AST 17 08/11/2019   PLT 253 11/17/2019   TSH 2.166 08/22/2016     She was last seen for this 6 months ago.  Management since that visit includes continue  medications.  She reports excellent compliance with treatment. She is not having side effects.   Symptoms: No chest pain No chest pressure/discomfort  No dyspnea No lower extremity edema  No numbness or tingling of extremity No orthopnea  No palpitations No paroxysmal nocturnal dyspnea  No speech difficulty No syncope   Current diet: not asked Current exercise: none  The 10-year ASCVD risk score Mikey Bussing DC Jr., et al., 2013) is: 17.8%  ---------------------------------------------------------------------------------------------------     Medications: Outpatient Medications Prior to Visit  Medication Sig  . albuterol (PROVENTIL HFA;VENTOLIN HFA) 108 (90 Base) MCG/ACT inhaler Inhale 2 puffs into the lungs every 6 (six) hours as needed for wheezing or shortness of breath.  Marland Kitchen albuterol (PROVENTIL) (2.5 MG/3ML) 0.083% nebulizer solution Take 3 mLs (2.5 mg total) by nebulization every 6 (six) hours as needed for wheezing or shortness of breath.  Marland Kitchen atorvastatin (LIPITOR) 20 MG tablet Take 1 tablet (20 mg total) by mouth daily.  . budesonide-formoterol (SYMBICORT) 160-4.5 MCG/ACT inhaler Inhale 2 puffs into the lungs 2 (two) times daily.  Marland Kitchen docusate sodium (COLACE) 100 MG capsule Take 200 mg by mouth 2 (two) times daily as needed for mild constipation.  . DULoxetine (CYMBALTA) 60 MG capsule Take 1 capsule (60 mg total) by mouth daily. Please call [phone] to schedule appt or may request future refills from PCP.  Marland Kitchen gabapentin (NEURONTIN) 600 MG tablet Take 600 mg by mouth 3 (three) times daily.  Marland Kitchen lisinopril (PRINIVIL,ZESTRIL) 40 MG tablet Take 1 tablet (40 mg total) by mouth daily.  . Naloxone HCl (NARCAN IJ) Inject as directed as needed.  . nitroGLYCERIN (NITROSTAT) 0.4 MG SL tablet Place 1 tablet (0.4 mg total) under the tongue every 5 (five) minutes x 3 doses as needed for chest pain.  Marland Kitchen omeprazole (PRILOSEC) 40 MG capsule TAKE 1 CAPSULE BY MOUTH TWICE A DAY  . Oxycodone HCl 10 MG TABS  Take 10 mg by mouth 6 (six) times daily.   . rizatriptan (MAXALT-MLT) 10 MG disintegrating tablet Take 1 tab at onset of migraine.  May repeat in 2 hrs, if needed.  Max dose: 2 tabs/day. This is a 30 day prescription.  Please call [phone] to schedule appt or may request future refills from PCP.  Marland Kitchen sucralfate (CARAFATE) 1 GM/10ML suspension Take 10 mLs (1 g total) by mouth 4 (four) times daily.  . Tiotropium Bromide Monohydrate (SPIRIVA RESPIMAT) 2.5 MCG/ACT AERS Inhale 1 puff into the lungs daily.  Marland Kitchen tiZANidine (ZANAFLEX) 4 MG tablet Take 4 mg by mouth 3 (three) times daily.   . [DISCONTINUED] metoprolol tartrate (LOPRESSOR) 25 MG tablet TAKE 1 TABLET BY MOUTH TWICE A DAY  . [DISCONTINUED] ondansetron (ZOFRAN-ODT) 8 MG disintegrating tablet TAKE 1 TABLET BY MOUTH 2 TIMES DAILY PLEASE CALL [phone] TO SCHEDULE APPT FOR CONTINUED REFILLS.   No facility-administered medications prior to visit.    Review of Systems  Constitutional: Negative.   Respiratory: Negative.   Cardiovascular: Negative.   Gastrointestinal: Positive  for nausea.      Objective    LMP 12/16/2017 (Approximate)    Physical Exam Constitutional:      Appearance: Normal appearance.  Cardiovascular:     Rate and Rhythm: Normal rate and regular rhythm.     Heart sounds: Normal heart sounds.  Pulmonary:     Effort: Pulmonary effort is normal.     Breath sounds: Normal breath sounds.  Skin:    General: Skin is warm and dry.  Neurological:     Mental Status: She is alert and oriented to person, place, and time. Mental status is at baseline.  Psychiatric:        Mood and Affect: Mood normal.        Behavior: Behavior normal.        Assessment & Plan    1. Chronic nausea   2. Chronic migraine  Continue medications.  3. Cervical radiculopathy  Followed by pain management.   4. Hypercholesterolemia  Previously well controlled Continue statin Repeat FLP and CMP Goal LDL < 100    5. Essential  hypertension  Uncontrolled, start amlodipine as below. Follow up in 1 month.  - amLODipine (NORVASC) 5 MG tablet; Take 1 tablet (5 mg total) by mouth daily.  Dispense: 90 tablet; Refill: 1  6. Nausea  - ondansetron (ZOFRAN-ODT) 8 MG disintegrating tablet; TAKE 1 TABLET BY MOUTH 2 TIMES DAILY PLEASE CALL [phone] TO SCHEDULE APPT FOR CONTINUED REFILLS.  Dispense: 20 tablet; Refill: 0  7. Suspected sleep apnea  - PSG Sleep Study; Future    No follow-ups on file.     I discussed the assessment and treatment plan with the patient. The patient was provided an opportunity to ask questions and all were answered. The patient agreed with the plan and demonstrated an understanding of the instructions.   The patient was advised to call back or seek an in-person evaluation if the symptoms worsen or if the condition fails to improve as anticipated.   ITrinna Post, [name], have reviewed all documentation for this visit. The documentation on 02/22/20 for the exam, diagnosis, procedures, and orders are all accurate and complete.  The entirety of the information documented in the History of Present Illness, Review of Systems and Physical Exam were personally obtained by me. Portions of this information were initially documented by [facility] and reviewed by me for thoroughness and accuracy.    Paulene Floor [facility] [phone] (phone) [phone] (fax)  [place]

## 2020-02-17 ENCOUNTER — Other Ambulatory Visit: Payer: Self-pay

## 2020-02-17 ENCOUNTER — Encounter: Payer: Self-pay | Admitting: Emergency Medicine

## 2020-02-17 ENCOUNTER — Emergency Department
Admission: EM | Admit: 2020-02-17 | Discharge: 2020-02-17 | Disposition: A | Discharge status: Home / Self Care | Payer: Medicaid Other | Attending: Emergency Medicine | Admitting: Emergency Medicine

## 2020-02-17 DIAGNOSIS — M549 Dorsalgia, unspecified: Secondary | ICD-10-CM | POA: Diagnosis not present

## 2020-02-17 DIAGNOSIS — G8929 Other chronic pain: Secondary | ICD-10-CM | POA: Insufficient documentation

## 2020-02-17 DIAGNOSIS — Z5321 Procedure and treatment not carried out due to patient leaving prior to being seen by health care provider: Secondary | ICD-10-CM | POA: Insufficient documentation

## 2020-02-17 NOTE — ED Triage Notes (Signed)
Presents with chronic back pain  Hx of same  States she had injection about 1 week ago  Conts;to have pain

## 2020-02-17 NOTE — ED Notes (Signed)
Pt was called at 1115 to be taken to a room and pt was not found in the lobby

## 2020-02-17 NOTE — ED Notes (Signed)
Call   No answer in lobby

## 2020-02-22 ENCOUNTER — Other Ambulatory Visit: Payer: Self-pay | Admitting: Physician Assistant

## 2020-02-22 DIAGNOSIS — I10 Essential (primary) hypertension: Secondary | ICD-10-CM

## 2020-02-22 NOTE — Telephone Encounter (Signed)
Requested Prescriptions  Pending Prescriptions Disp Refills   metoprolol tartrate (LOPRESSOR) 25 MG tablet [Pharmacy Med Name: METOPROLOL TARTRATE 25 MG TAB] 180 tablet 0    Sig: TAKE 1 TABLET BY MOUTH TWICE A DAY     Cardiovascular:  Beta Blockers Failed - 02/22/2020  1:46 AM      Failed - Last BP in normal range    BP Readings from Last 1 Encounters:  11/17/19 (!) 175/93         Passed - Last Heart Rate in normal range    Pulse Readings from Last 1 Encounters:  11/17/19 65         Passed - Valid encounter within last 6 months    Recent Outpatient Visits          1 week ago Chronic nausea   [place], [place], [place]   6 months ago Pre-op examination   [place], [place], [place]   6 months ago Hypercholesterolemia   [facility] [place], [place], [place]   1 year ago Hospital discharge follow-up   [place], [place], [place]   1 year ago Essential hypertension   [place], [place], [place]             Elevated BP above was from ED visit when patient went to ED with chest pain.  Patient left without being seen.  Previous BPs:  08/25/2019  130/82, 08/11/2019 130/80 and 08/05/2019  144/82 as seen under patient's snapshot.

## 2020-02-23 ENCOUNTER — Other Ambulatory Visit: Payer: Self-pay | Admitting: Neurology

## 2020-02-23 ENCOUNTER — Other Ambulatory Visit: Payer: Self-pay | Admitting: Physician Assistant

## 2020-02-23 ENCOUNTER — Other Ambulatory Visit: Payer: Self-pay | Admitting: Gastroenterology

## 2020-02-23 DIAGNOSIS — R110 Nausea: Secondary | ICD-10-CM

## 2020-02-23 NOTE — Telephone Encounter (Signed)
Requested medication (s) are due for refill today: yes  Requested medication (s) are on the active medication list: yes  Last refill:  02/11/20 #20  Future visit scheduled: no  Notes to clinic:  Please review for refill. Refill not delegated per protocol    Requested Prescriptions  Pending Prescriptions Disp Refills   ondansetron (ZOFRAN-ODT) 8 MG disintegrating tablet [Pharmacy Med Name: ONDANSETRON ODT 8 MG TABLET] 20 tablet 0    Sig: TAKE 1 TABLET BY MOUTH 2 TIMES DAILY PLEASE CALL [phone] TO SCHEDULE APPT FOR CONTINUED REFILLS.      Not Delegated - Gastroenterology: Antiemetics Failed - 02/23/2020  5:23 PM      Failed - This refill cannot be delegated      Passed - Valid encounter within last 6 months    Recent Outpatient Visits           1 week ago Chronic nausea   [place], [place], [place]   6 months ago Pre-op examination   [place], [place], [place]   6 months ago Hypercholesterolemia   [facility] [place], [place], [place]   1 year ago Hospital discharge follow-up   [facility] [name], [place]   1 year ago Essential hypertension   [place], [place], [place]

## 2020-03-10 ENCOUNTER — Other Ambulatory Visit: Payer: Self-pay

## 2020-03-10 DIAGNOSIS — J449 Chronic obstructive pulmonary disease, unspecified: Secondary | ICD-10-CM

## 2020-03-13 ENCOUNTER — Other Ambulatory Visit: Payer: Self-pay | Admitting: Physician Assistant

## 2020-03-13 DIAGNOSIS — R110 Nausea: Secondary | ICD-10-CM

## 2020-03-13 NOTE — Telephone Encounter (Signed)
Requested medication (s) are due for refill today:yes  Requested medication (s) are on the active medication list: yes  Last refill: 02/29/20  #20  0 refills  Future visit scheduled no  Notes to clinic:Not delegated  Requested Prescriptions  Pending Prescriptions Disp Refills   ondansetron (ZOFRAN-ODT) 8 MG disintegrating tablet [Pharmacy Med Name: ONDANSETRON ODT 8 MG TABLET] 20 tablet 0    Sig: TAKE 1 TABLET BY MOUTH 2 TIMES DAILY PLEASE CALL [phone] TO SCHEDULE APPT FOR CONTINUED REFILLS.      Not Delegated - Gastroenterology: Antiemetics Failed - 03/13/2020  1:45 PM      Failed - This refill cannot be delegated      Passed - Valid encounter within last 6 months    Recent Outpatient Visits           1 month ago Chronic nausea   [place], [place], [place]   7 months ago Pre-op examination   [place], [place], [place]   7 months ago Hypercholesterolemia   [facility] [place], [place], [place]   1 year ago Hospital discharge follow-up   [facility] [place] [name], [place]   1 year ago Essential hypertension   [place], [place], [place]

## 2020-03-14 NOTE — Telephone Encounter (Signed)
L.O.V. was on 08/11/2019 and no upcoming visit.Can medication be refilled?

## 2020-03-15 DIAGNOSIS — M5416 Radiculopathy, lumbar region: Secondary | ICD-10-CM | POA: Diagnosis not present

## 2020-03-16 ENCOUNTER — Other Ambulatory Visit: Payer: Self-pay | Admitting: Neurological Surgery

## 2020-03-29 NOTE — Progress Notes (Signed)
[id number] - [place], [place] - [address] [place] [address] Phone: [phone] Fax: [phone]      Your procedure is scheduled on Monday, October 11th.  Report to [facility] Main Entrance "A" at 5:00 A.M., and check in at the Admitting office.  Call this number if you have problems the morning of surgery:  [phone]  Call [phone] if you have any questions prior to your surgery date Monday-Friday 8am-4pm    Remember:  Do not eat after midnight the night before your surgery  You may drink clear liquids until 5:00 AM the morning of your surgery.   Clear liquids allowed are: Water, Non-Citrus Juices (without pulp), Carbonated Beverages, Clear Tea, Black Coffee Only, and Gatorade    Take these medicines the morning of surgery with A SIP OF WATER   Albuterol inhaler - if needed (bring with you on day of surgery)  Albuterol nebulizer - if needed  Amlodipine (Norvasc)  Atorvastatin (Lipitor)  Colace - if needed  Gabapentin (Neurontin)  Metoprolol (Lopressor)  Nitroglycerin tablet - if needed  Omeprazole (Prilosec)  Zofran - if needed  Spiriva inhaler  As of today, STOP taking any Aspirin (unless otherwise instructed by your surgeon) Aleve, Naproxen, Ibuprofen, Motrin, Advil, Goody's, BC's, all herbal medications, fish oil, and all vitamins.                      Do not wear jewelry, make up, or nail polish            Do not wear lotions, powders, perfumes, or deodorant.            Do not shave 48 hours prior to surgery.              Do not bring valuables to the hospital.            [facility] is not responsible for any belongings or valuables.  Do NOT Smoke (Tobacco/Vaping) or drink Alcohol 24 hours prior to your procedure If you use a CPAP at night, you may bring all equipment for your overnight stay.   Contacts, glasses, dentures or bridgework may not be worn into surgery.      For patients admitted to the hospital, discharge time will be  determined by your treatment team.   Patients discharged the day of surgery will not be allowed to drive home, and someone needs to stay with them for 24 hours.    Special instructions:   [facility]- Preparing For Surgery  Before surgery, you can play an important role. Because skin is not sterile, your skin needs to be as free of germs as possible. You can reduce the number of germs on your skin by washing with CHG (chlorahexidine gluconate) Soap before surgery.  CHG is an antiseptic cleaner which kills germs and bonds with the skin to continue killing germs even after washing.    Oral Hygiene is also important to reduce your risk of infection.  Remember - BRUSH YOUR TEETH THE MORNING OF SURGERY WITH YOUR REGULAR TOOTHPASTE  Please do not use if you have an allergy to CHG or antibacterial soaps. If your skin becomes reddened/irritated stop using the CHG.  Do not shave (including legs and underarms) for at least 48 hours prior to first CHG shower. It is OK to shave your face.  Please follow these instructions carefully.   1. Shower the NIGHT BEFORE SURGERY and the MORNING OF SURGERY with  CHG Soap.   2. If you chose to wash your hair, wash your hair first as usual with your normal shampoo.  3. After you shampoo, rinse your hair and body thoroughly to remove the shampoo.  4. Use CHG as you would any other liquid soap. You can apply CHG directly to the skin and wash gently with a scrungie or a clean washcloth.   5. Apply the CHG Soap to your body ONLY FROM THE NECK DOWN.  Do not use on open wounds or open sores. Avoid contact with your eyes, ears, mouth and genitals (private parts). Wash Face and genitals (private parts)  with your normal soap.   6. Wash thoroughly, paying special attention to the area where your surgery will be performed.  7. Thoroughly rinse your body with warm water from the neck down.  8. DO NOT shower/wash with your normal soap after using and rinsing off the CHG  Soap.  9. Pat yourself dry with a CLEAN TOWEL.  10. Wear CLEAN PAJAMAS to bed the night before surgery  11. Place CLEAN SHEETS on your bed the night of your first shower and DO NOT SLEEP WITH PETS.   Day of Surgery: Wear Clean/Comfortable clothing the morning of surgery Do not apply any deodorants/lotions.   Remember to brush your teeth WITH YOUR REGULAR TOOTHPASTE.   Please read over the following fact sheets that you were given.

## 2020-03-30 ENCOUNTER — Other Ambulatory Visit
Admission: RE | Admit: 2020-03-30 | Discharge: 2020-03-30 | Disposition: A | Discharge status: Home / Self Care | Payer: Medicaid Other | Source: Ambulatory Visit | Attending: Neurological Surgery | Admitting: Neurological Surgery

## 2020-03-30 ENCOUNTER — Encounter (HOSPITAL_COMMUNITY)
Admission: RE | Admit: 2020-03-30 | Discharge: 2020-03-30 | Disposition: A | Discharge status: Home / Self Care | Payer: Medicaid Other | Source: Ambulatory Visit | Attending: Neurological Surgery | Admitting: Neurological Surgery

## 2020-03-30 ENCOUNTER — Telehealth: Payer: Self-pay

## 2020-03-30 ENCOUNTER — Other Ambulatory Visit: Payer: Self-pay

## 2020-03-30 DIAGNOSIS — Z01812 Encounter for preprocedural laboratory examination: Secondary | ICD-10-CM | POA: Diagnosis not present

## 2020-03-30 DIAGNOSIS — Z20822 Contact with and (suspected) exposure to covid-19: Secondary | ICD-10-CM | POA: Insufficient documentation

## 2020-03-30 LAB — SARS CORONAVIRUS 2 (TAT 6-24 HRS): SARS Coronavirus 2: NEGATIVE

## 2020-03-30 NOTE — Telephone Encounter (Signed)
Lm to really date/time of covid test prior PFT.   10[facility]12/[facility]

## 2020-03-30 NOTE — Telephone Encounter (Signed)
Patient is aware of date/time of covid test prior PFT. Nothing further is needed.

## 2020-03-30 NOTE — Progress Notes (Addendum)
[id number] - [place], [place] - [address] [place] [address] Phone: [phone] Fax: [phone]      Your procedure is scheduled on Monday, October 11th.  Report to [facility] Main Entrance "A" at 6:00 A.M., and check in at the Admitting office.   Call this number if you have problems, questions or concerns between now and the morning of surgery:  [phone]   Remember:  Do not eat or drink after midnight the night before your surgery   Take these medicines the morning of surgery with A SIP OF WATER   Albuterol inhaler - if needed (bring with you on day of surgery)  Albuterol nebulizer - if needed  Amlodipine (Norvasc)  Atorvastatin (Lipitor)  Colace - if needed  Gabapentin (Neurontin)  Metaxalone (Skelaxin)  Metoprolol (Lopressor)  Nitroglycerin tablet - if needed  Omeprazole (Prilosec)  Oxycodone HCl  Zofran - if needed  Spiriva inhaler  As of today, STOP taking any Aspirin (unless otherwise instructed by your surgeon) Aleve, Naproxen, Ibuprofen, Motrin, Advil, Goody's, BC's, all herbal medications, fish oil, and all vitamins.                      Do not wear jewelry, make up, or nail polish            Do not wear lotions, powders, perfumes, or deodorant.            Do not shave 48 hours prior to surgery.              Do not bring valuables to the hospital.            [facility] is not responsible for any belongings or valuables.  Do NOT Smoke (Tobacco/Vaping) or drink Alcohol 24 hours prior to your procedure If you use a CPAP at night, you may bring all equipment for your overnight stay.   Contacts, glasses, dentures or bridgework may not be worn into surgery.      For patients admitted to the hospital, discharge time will be determined by your treatment team.   Patients discharged the day of surgery will not be allowed to drive home, and someone needs to stay with them for 24 hours.    Special instructions:   [place]- Preparing  For Surgery  Before surgery, you can play an important role. Because skin is not sterile, your skin needs to be as free of germs as possible. You can reduce the number of germs on your skin by washing with CHG (chlorahexidine gluconate) Soap before surgery.  CHG is an antiseptic cleaner which kills germs and bonds with the skin to continue killing germs even after washing.    Oral Hygiene is also important to reduce your risk of infection.  Remember - BRUSH YOUR TEETH THE MORNING OF SURGERY WITH YOUR REGULAR TOOTHPASTE  Please do not use if you have an allergy to CHG or antibacterial soaps. If your skin becomes reddened/irritated stop using the CHG.  Do not shave (including legs and underarms) for at least 48 hours prior to first CHG shower. It is OK to shave your face.  Please follow these instructions carefully.   1. Shower the NIGHT BEFORE SURGERY and the MORNING OF SURGERY with CHG Soap.   2. If you chose to wash your hair, wash your hair first as usual with your normal shampoo.  3. After you shampoo, rinse your hair and body thoroughly to remove the  shampoo.  4. Use CHG as you would any other liquid soap. You can apply CHG directly to the skin and wash gently with a scrungie or a clean washcloth.   5. Apply the CHG Soap to your body ONLY FROM THE NECK DOWN.  Do not use on open wounds or open sores. Avoid contact with your eyes, ears, mouth and genitals (private parts). Wash Face and genitals (private parts)  with your normal soap.   6. Wash thoroughly, paying special attention to the area where your surgery will be performed.  7. Thoroughly rinse your body with warm water from the neck down.  8. DO NOT shower/wash with your normal soap after using and rinsing off the CHG Soap.  9. Pat yourself dry with a CLEAN TOWEL.  10. Wear CLEAN PAJAMAS to bed the night before surgery  11. Place CLEAN SHEETS on your bed the night of your first shower and DO NOT SLEEP WITH PETS.   Day of  Surgery: Shower with CHG soap as directed Wear Clean/Comfortable clothing the morning of surgery Do not apply any deodorants/lotions.   Remember to brush your teeth WITH YOUR REGULAR TOOTHPASTE.   Please read over the following fact sheets that you were given.

## 2020-04-03 ENCOUNTER — Encounter (HOSPITAL_COMMUNITY): Admission: RE | Payer: Self-pay | Source: Home / Self Care

## 2020-04-03 ENCOUNTER — Other Ambulatory Visit: Payer: Self-pay

## 2020-04-03 ENCOUNTER — Encounter: Payer: Self-pay | Admitting: Physician Assistant

## 2020-04-03 ENCOUNTER — Ambulatory Visit (INDEPENDENT_AMBULATORY_CARE_PROVIDER_SITE_OTHER): Payer: Medicaid Other | Admitting: Physician Assistant

## 2020-04-03 ENCOUNTER — Inpatient Hospital Stay (HOSPITAL_COMMUNITY): Admission: RE | Admit: 2020-04-03 | Payer: Medicaid Other | Source: Home / Self Care | Admitting: Neurological Surgery

## 2020-04-03 VITALS — BP 172/88 | HR 59 | Temp 98.0°F | Resp 16 | Ht 64.0 in | Wt 185.8 lb

## 2020-04-03 DIAGNOSIS — R928 Other abnormal and inconclusive findings on diagnostic imaging of breast: Secondary | ICD-10-CM

## 2020-04-03 DIAGNOSIS — I10 Essential (primary) hypertension: Secondary | ICD-10-CM | POA: Diagnosis not present

## 2020-04-03 DIAGNOSIS — Z716 Tobacco abuse counseling: Secondary | ICD-10-CM

## 2020-04-03 SURGERY — MINIMALLY INVASIVE (MIS) TRANSFORAMINAL LUMBAR INTERBODY FUSION (TLIF) 1 LEVEL
Anesthesia: General | Laterality: Right

## 2020-04-03 MED ORDER — BUPROPION HCL ER (SR) 150 MG PO TB12: ORAL_TABLET | ORAL | 3 refills | Status: DC

## 2020-04-03 NOTE — Progress Notes (Signed)
Established patient visit   Patient: Sara Munoz   DOB: Feb 27, 1966   54 y.o. Female  MRN: [id number] Visit Date: 04/03/2020  Today's healthcare provider: [name], [name]   Chief Complaint  Patient presents with  . Nicotine Dependence   Subjective    Nicotine Dependence Presents for initial visit. Preferred tobacco types include cigarettes. Her urge triggers include company of smokers. Sara Munoz is thinking about quitting.    Patient reports that she has to stop smoking for at least 10 days in order for insurance to pay for her spinal surgery. She needs a non-nicotine medication.   Hypertension, follow-up  BP Readings from Last 3 Encounters:  04/03/20 (!) 172/88  11/17/19 (!) 175/93  08/25/19 130/82   Wt Readings from Last 3 Encounters:  04/03/20 185 lb 12.8 oz (84.3 kg)  11/17/19 185 lb (83.9 kg)  08/25/19 191 lb 9.6 oz (86.9 kg)     She was last seen for hypertension 2 months ago.  BP at that visit was elevated. Management since that visit includes starting amlodipine 5 mg daily.  She reports good compliance with treatment. She is not having side effects.  She is following a Regular diet. She is not exercising. She does smoke.  Use of agents associated with hypertension: none.   Outside blood pressures are elevated. Symptoms: No chest pain No chest pressure  No palpitations No syncope  No dyspnea No orthopnea  No paroxysmal nocturnal dyspnea No lower extremity edema   Pertinent labs: Lab Results  Component Value Date   CHOL 251 (H) 02/20/2018   HDL 46 02/20/2018   LDLCALC 161 (H) 02/20/2018   TRIG 220 (H) 02/20/2018   CHOLHDL 5.5 (H) 02/20/2018   Lab Results  Component Value Date   NA 139 11/17/2019   K 3.6 11/17/2019   CREATININE 0.84 11/17/2019   GFRNONAA >60 11/17/2019   GFRAA >60 11/17/2019   GLUCOSE 109 (H) 11/17/2019     The 10-year ASCVD risk score Mikey Bussing DC Jr., et al., 2013) is: 14.9%    ---------------------------------------------------------------------------------------------------       Medications: Outpatient Medications Prior to Visit  Medication Sig  . albuterol (PROVENTIL HFA;VENTOLIN HFA) 108 (90 Base) MCG/ACT inhaler Inhale 2 puffs into the lungs every 6 (six) hours as needed for wheezing or shortness of breath.  Marland Kitchen albuterol (PROVENTIL) (2.5 MG/3ML) 0.083% nebulizer solution Take 3 mLs (2.5 mg total) by nebulization every 6 (six) hours as needed for wheezing or shortness of breath.  Marland Kitchen amLODipine (NORVASC) 5 MG tablet Take 1 tablet (5 mg total) by mouth daily.  Marland Kitchen atorvastatin (LIPITOR) 20 MG tablet Take 1 tablet (20 mg total) by mouth daily.  . budesonide-formoterol (SYMBICORT) 160-4.5 MCG/ACT inhaler Inhale 2 puffs into the lungs 2 (two) times daily. (Patient not taking: Reported on 03/20/2020)  . docusate sodium (COLACE) 100 MG capsule Take 200 mg by mouth daily as needed for moderate constipation.   . DULoxetine (CYMBALTA) 60 MG capsule Take 1 capsule (60 mg total) by mouth daily. Please call [phone] to schedule appt or may request future refills from PCP. (Patient not taking: Reported on 03/20/2020)  . gabapentin (NEURONTIN) 600 MG tablet Take 600 mg by mouth 3 (three) times daily.  Marland Kitchen lisinopril (PRINIVIL,ZESTRIL) 40 MG tablet Take 1 tablet (40 mg total) by mouth daily.  . metaxalone (SKELAXIN) 800 MG tablet Take 800 mg by mouth 3 (three) times daily.  . metoprolol tartrate (LOPRESSOR) 25 MG tablet TAKE 1 TABLET BY MOUTH  TWICE A DAY (Patient taking differently: Take 25 mg by mouth 2 (two) times daily. )  . Naloxone HCl (NARCAN IJ) Inject as directed as needed. (Patient not taking: Reported on 03/20/2020)  . nitroGLYCERIN (NITROSTAT) 0.4 MG SL tablet Place 1 tablet (0.4 mg total) under the tongue every 5 (five) minutes x 3 doses as needed for chest pain.  Marland Kitchen omeprazole (PRILOSEC) 40 MG capsule TAKE 1 CAPSULE BY MOUTH TWICE A DAY (Patient taking differently: Take 40  mg by mouth in the morning and at bedtime. )  . ondansetron (ZOFRAN-ODT) 8 MG disintegrating tablet Take 1 tablet (8 mg total) by mouth 2 (two) times daily.  . Oxycodone HCl 20 MG TABS Take 20 mg by mouth in the morning, at noon, and at bedtime.   . rizatriptan (MAXALT-MLT) 10 MG disintegrating tablet Take 1 tab at onset of migraine.  May repeat in 2 hrs, if needed.  Max dose: 2 tabs/day. This is a 30 day prescription.  Please call [phone] to schedule appt or may request future refills from PCP. (Patient not taking: Reported on 03/20/2020)  . sucralfate (CARAFATE) 1 GM/10ML suspension TAKE 10 MLS (1 G TOTAL) BY MOUTH 4 (FOUR) TIMES DAILY.  Marland Kitchen Tiotropium Bromide Monohydrate (SPIRIVA RESPIMAT) 2.5 MCG/ACT AERS Inhale 1 puff into the lungs daily.  Marland Kitchen tiZANidine (ZANAFLEX) 4 MG tablet Take 4 mg by mouth 3 (three) times daily.  (Patient not taking: Reported on 03/20/2020)   No facility-administered medications prior to visit.    Review of Systems  Constitutional: Negative.   Respiratory: Negative.   Cardiovascular: Negative.   Neurological: Negative.       Objective    BP (!) 172/88   Pulse (!) 59   Temp 98 F (36.7 C)   Resp 16   Ht 5\' 4"  (1.626 m)   Wt 185 lb 12.8 oz (84.3 kg)   LMP 12/16/2017 (Approximate)   BMI 31.89 kg/m    Physical Exam Constitutional:      Appearance: Normal appearance.  Cardiovascular:     Rate and Rhythm: Normal rate and regular rhythm.     Heart sounds: Normal heart sounds.  Pulmonary:     Effort: Pulmonary effort is normal.     Breath sounds: Wheezing present.  Skin:    General: Skin is warm and dry.  Neurological:     Mental Status: She is alert and oriented to person, place, and time. Mental status is at baseline.  Psychiatric:        Mood and Affect: Mood normal.        Behavior: Behavior normal.       No results found for any visits on 04/03/20.  Assessment & Plan     1. Encounter for tobacco use cessation counseling  I have counseled  >3 minutes about the risks/benefits of smoking cessation.   - buPROPion (WELLBUTRIN SR) 150 MG 12 hr tablet; 1 tablet daily for 3 days, then 1 tablet twice daily. Stop smoking 14 days after starting medication  Dispense: 60 tablet; Refill: 3  2. Primary hypertension  Increase amlodipine to 10 mg daily. Follow up in three months.   3. Abnormal mammogram  She has not had a mammogram since 06/2017. Reminded to call and schedule as she is overdue. Calling card given.       ITrinna Post, [name], have reviewed all documentation for this visit. The documentation on 04/06/20 for the exam, diagnosis, procedures, and orders are all accurate and complete.  The  entirety of the information documented in the History of Present Illness, Review of Systems and Physical Exam were personally obtained by me. Portions of this information were initially documented by Wilburt Finlay, CMA and reviewed by me for thoroughness and accuracy.     Paulene Floor  [facility] [phone] (phone) [phone] (fax)  [place]

## 2020-04-04 ENCOUNTER — Other Ambulatory Visit: Payer: Medicaid Other

## 2020-04-05 ENCOUNTER — Ambulatory Visit: Payer: Medicaid Other | Attending: Pulmonary Disease

## 2020-04-06 ENCOUNTER — Encounter: Payer: Self-pay | Admitting: Physician Assistant

## 2020-04-28 ENCOUNTER — Other Ambulatory Visit: Payer: Self-pay | Admitting: Physician Assistant

## 2020-04-28 DIAGNOSIS — R110 Nausea: Secondary | ICD-10-CM

## 2020-04-28 NOTE — Telephone Encounter (Signed)
Requested medication (s) are due for refill today: yes  Requested medication (s) are on the active medication list: yes  Last refill:  03/21/20 #20 with 1 refill  Future visit scheduled: yes  Notes to clinic:  Please review for refill. Refill not delegated per protocol.     Requested Prescriptions  Pending Prescriptions Disp Refills   ondansetron (ZOFRAN-ODT) 8 MG disintegrating tablet [Pharmacy Med Name: ONDANSETRON ODT 8 MG TABLET] 20 tablet 1    Sig: Take 1 tablet (8 mg total) by mouth 2 (two) times daily.      Not Delegated - Gastroenterology: Antiemetics Failed - 04/28/2020  3:14 PM      Failed - This refill cannot be delegated      Passed - Valid encounter within last 6 months    Recent Outpatient Visits           3 weeks ago Encounter for tobacco use cessation counseling   [place], [place], [place]   2 months ago Chronic nausea   [place], [place], [place]   8 months ago Pre-op examination   [place], [place], [place]   9 months ago Hypercholesterolemia   [facility] [place], [place], [place]   1 year ago Hospital discharge follow-up   [id number] [name], [place]       Future Appointments             In 3 weeks [name], MD [place]   In 5 months [name], [place], [place]

## 2020-05-01 NOTE — Telephone Encounter (Signed)
L.O.V. was on 04/03/2020 and next visit is on 10/03/2020. Medication send into pharmacy.

## 2020-05-04 ENCOUNTER — Other Ambulatory Visit: Payer: Self-pay | Admitting: Physician Assistant

## 2020-05-04 DIAGNOSIS — Z716 Tobacco abuse counseling: Secondary | ICD-10-CM

## 2020-05-09 ENCOUNTER — Other Ambulatory Visit: Payer: Self-pay | Admitting: Neurological Surgery

## 2020-05-15 NOTE — Progress Notes (Signed)
[id number] - [place], [place] - [address] [place] [address] Phone: [phone] Fax: [phone]      Your procedure is scheduled on 05/23/20 at 1:00pm..  Report to [facility] Main Entrance "A" at 11:00 A.M., and check in at the Admitting office.  Call this number if you have problems the morning of surgery:  [phone]  Call [phone] if you have any questions prior to your surgery date Monday-Friday 8am-4pm    Remember:  Do not eat or drink after midnight the night before your surgery     Take these medicines the morning of surgery with A SIP OF WATER  amLODipine (NORVASC) budesonide-formoterol (SYMBICORT) gabapentin (NEURONTIN)  metaxalone (SKELAXIN) metoprolol tartrate (LOPRESSOR) omeprazole (PRILOSEC) Oxycodone HCl  sucralfate (CARAFATE) Tiotropium Bromide Monohydrate (SPIRIVA RESPIMAT)  Take the following the morning of surgery if needed.  albuterol (PROVENTIL HFA;VENTOLIN HFA) (bring inhaler with you the day of surgery) albuterol (PROVENTIL) nebulizer solution  As of today, STOP taking any Aspirin (unless otherwise instructed by your surgeon) Aleve, Naproxen, Ibuprofen, Motrin, Advil, Goody's, BC's, all herbal medications, fish oil, and all vitamins.                      Do not wear jewelry, make up, or nail polish            Do not wear lotions, powders, perfumes/colognes, or deodorant.            Do not shave 48 hours prior to surgery.              Do not bring valuables to the hospital.            [facility] is not responsible for any belongings or valuables.  Do NOT Smoke (Tobacco/Vaping) or drink Alcohol 24 hours prior to your procedure If you use a CPAP at night, you may bring all equipment for your overnight stay.   Contacts, glasses, dentures or bridgework may not be worn into surgery.      For patients admitted to the hospital, discharge time will be determined by your treatment team.   Patients discharged the day of  surgery will not be allowed to drive home, and someone needs to stay with them for 24 hours.    Special instructions:   [facility]- Preparing For Surgery  Before surgery, you can play an important role. Because skin is not sterile, your skin needs to be as free of germs as possible. You can reduce the number of germs on your skin by washing with CHG (chlorahexidine gluconate) Soap before surgery.  CHG is an antiseptic cleaner which kills germs and bonds with the skin to continue killing germs even after washing.    Oral Hygiene is also important to reduce your risk of infection.  Remember - BRUSH YOUR TEETH THE MORNING OF SURGERY WITH YOUR REGULAR TOOTHPASTE  Please do not use if you have an allergy to CHG or antibacterial soaps. If your skin becomes reddened/irritated stop using the CHG.  Do not shave (including legs and underarms) for at least 48 hours prior to first CHG shower. It is OK to shave your face.  Please follow these instructions carefully.   1. Shower the NIGHT BEFORE SURGERY and the MORNING OF SURGERY with CHG Soap.   2. If you chose to wash your hair, wash your hair first as usual with your normal shampoo.  3. After you shampoo, rinse your hair and body thoroughly  to remove the shampoo.  4. Use CHG as you would any other liquid soap. You can apply CHG directly to the skin and wash gently with a scrungie or a clean washcloth.   5. Apply the CHG Soap to your body ONLY FROM THE NECK DOWN.  Do not use on open wounds or open sores. Avoid contact with your eyes, ears, mouth and genitals (private parts). Wash Face and genitals (private parts)  with your normal soap.   6. Wash thoroughly, paying special attention to the area where your surgery will be performed.  7. Thoroughly rinse your body with warm water from the neck down.  8. DO NOT shower/wash with your normal soap after using and rinsing off the CHG Soap.  9. Pat yourself dry with a CLEAN TOWEL.  10. Wear CLEAN  PAJAMAS to bed the night before surgery  11. Place CLEAN SHEETS on your bed the night of your first shower and DO NOT SLEEP WITH PETS.   Day of Surgery: Wear Clean/Comfortable clothing the morning of surgery Do not apply any deodorants/lotions.   Remember to brush your teeth WITH YOUR REGULAR TOOTHPASTE.   Please read over the following fact sheets that you were given.

## 2020-05-16 ENCOUNTER — Encounter (HOSPITAL_COMMUNITY): Payer: Self-pay

## 2020-05-16 ENCOUNTER — Other Ambulatory Visit: Payer: Self-pay

## 2020-05-16 ENCOUNTER — Encounter (HOSPITAL_COMMUNITY)
Admission: RE | Admit: 2020-05-16 | Discharge: 2020-05-16 | Disposition: A | Discharge status: Home / Self Care | Payer: Medicaid Other | Source: Ambulatory Visit | Attending: Neurological Surgery | Admitting: Neurological Surgery

## 2020-05-16 DIAGNOSIS — I252 Old myocardial infarction: Secondary | ICD-10-CM | POA: Diagnosis not present

## 2020-05-16 DIAGNOSIS — I2510 Atherosclerotic heart disease of native coronary artery without angina pectoris: Secondary | ICD-10-CM | POA: Diagnosis not present

## 2020-05-16 DIAGNOSIS — E669 Obesity, unspecified: Secondary | ICD-10-CM | POA: Insufficient documentation

## 2020-05-16 DIAGNOSIS — I10 Essential (primary) hypertension: Secondary | ICD-10-CM | POA: Diagnosis not present

## 2020-05-16 DIAGNOSIS — M5416 Radiculopathy, lumbar region: Secondary | ICD-10-CM | POA: Diagnosis not present

## 2020-05-16 DIAGNOSIS — Z6832 Body mass index (BMI) 32.0-32.9, adult: Secondary | ICD-10-CM | POA: Insufficient documentation

## 2020-05-16 DIAGNOSIS — F17200 Nicotine dependence, unspecified, uncomplicated: Secondary | ICD-10-CM | POA: Diagnosis not present

## 2020-05-16 DIAGNOSIS — Z8673 Personal history of transient ischemic attack (TIA), and cerebral infarction without residual deficits: Secondary | ICD-10-CM | POA: Diagnosis not present

## 2020-05-16 DIAGNOSIS — Z79899 Other long term (current) drug therapy: Secondary | ICD-10-CM | POA: Diagnosis not present

## 2020-05-16 DIAGNOSIS — Z01812 Encounter for preprocedural laboratory examination: Secondary | ICD-10-CM | POA: Diagnosis present

## 2020-05-16 DIAGNOSIS — J449 Chronic obstructive pulmonary disease, unspecified: Secondary | ICD-10-CM | POA: Diagnosis not present

## 2020-05-16 HISTORY — DX: Neuralgia and neuritis, unspecified: M79.2

## 2020-05-16 HISTORY — DX: Unspecified viral hepatitis C without hepatic coma: B19.20

## 2020-05-16 LAB — TYPE AND SCREEN
ABO/RH(D): O POS
Antibody Screen: NEGATIVE

## 2020-05-16 LAB — CBC
HCT: 42.1 % (ref 36.0–46.0)
Hemoglobin: 13.2 g/dL (ref 12.0–15.0)
MCH: 27 pg (ref 26.0–34.0)
MCHC: 31.4 g/dL (ref 30.0–36.0)
MCV: 86.3 fL (ref 80.0–100.0)
Platelets: 228 10*3/uL (ref 150–400)
RBC: 4.88 MIL/uL (ref 3.87–5.11)
RDW: 16.1 % — ABNORMAL HIGH (ref 11.5–15.5)
WBC: 9.3 10*3/uL (ref 4.0–10.5)
nRBC: 0 % (ref 0.0–0.2)

## 2020-05-16 LAB — SURGICAL PCR SCREEN
MRSA, PCR: NEGATIVE
Staphylococcus aureus: NEGATIVE

## 2020-05-16 LAB — COMPREHENSIVE METABOLIC PANEL
ALT: 16 U/L (ref 0–44)
AST: 23 U/L (ref 15–41)
Albumin: 3.5 g/dL (ref 3.5–5.0)
Alkaline Phosphatase: 81 U/L (ref 38–126)
Anion gap: 10 (ref 5–15)
BUN: 7 mg/dL (ref 6–20)
CO2: 26 mmol/L (ref 22–32)
Calcium: 9.2 mg/dL (ref 8.9–10.3)
Chloride: 102 mmol/L (ref 98–111)
Creatinine, Ser: 0.75 mg/dL (ref 0.44–1.00)
GFR, Estimated: >60 mL/min (ref >60)
Glucose, Bld: 96 mg/dL (ref 70–99)
Potassium: 4.1 mmol/L (ref 3.5–5.1)
Sodium: 138 mmol/L (ref 135–145)
Total Bilirubin: 0.5 mg/dL (ref 0.3–1.2)
Total Protein: 6.7 g/dL (ref 6.5–8.1)

## 2020-05-16 LAB — GLUCOSE, CAPILLARY: Glucose-Capillary: 99 mg/dL (ref 70–99)

## 2020-05-16 NOTE — Progress Notes (Signed)
PCP - Carles Collet, [name] Cardiologist - Dr. Lujean Amel Pulmonologist: Dr. Vernard Gambles  PPM/ICD - denies  Chest x-ray - N/A EKG - 11/17/2019 Stress Test - 09/01/2019 (media tab) ECHO - 09/01/2019 Cardiac Cath - 02/22/16  Sleep Study - per patient one was done, negative test CPAP -  N/A  DM: denies, A1c 07/2019, 5.9/pre-diabetic per range  Blood Thinner Instructions: N/A Aspirin Instructions: N/A  ERAS Protcol -No  COVID TEST- Scheduled for 05/19/2020. Patient verbalized understanding of self-quarantine instructions, appointment time and place.  Anesthesia review: YES, cardiac hx, abnormal EKG and chest Xray  Patient denies shortness of breath, fever, cough and chest pain at PAT appointment  All instructions explained to the patient, with a verbal understanding of the material. Patient agrees to go over the instructions while at home for a better understanding. Patient also instructed to self quarantine after being tested for COVID-19. The opportunity to ask questions was provided.

## 2020-05-16 NOTE — Progress Notes (Signed)
Anesthesia Chart Review:  Case: [id number] Date/Time: 05/23/20 1244   Procedure: Right Lumbar 5 Sacral 1 Minimally invasive transforaminal lumbar interbody fusion (Right ) - 3C/RM 20   Anesthesia type: General   Pre-op diagnosis: Lumbar radiculopathy   Location: [facility] OR ROOM 20 / [place] OR   Surgeons: Judith Part, MD      DISCUSSION: Patient is a 54 year old female scheduled for the above procedure.  History includes smoking, CAD (reported MI ~ 2011; multiple evaluations for chest pain; only mild CAD 01/2016; normal stress test 08/2019), HTN, COPD, hypercholesterolemia, anxiety, GERD, asthma, hepatitis C (s/p treatment), CVA (reported CVA prior to 2019), pyelonephritis/severe sepsis (09/2018), C5-6 ACDF (01/11/19), chronic back pain, chronic opiate use.  BMI is consistent with obesity.  She had reassuring cardiac testing in March 2021 (see CV).  Preoperative COVID-19 testing scheduled for 05/19/2020.  Anesthesia team to evaluate on the day of surgery.   VS: BP (!) 166/95   Pulse 71   Temp 37 C (Oral)   Resp 18   Ht 5' 4"  (1.626 m)   Wt 86.2 kg   LMP 12/16/2017 (Approximate)   SpO2 99%   BMI 32.62 kg/m    PROVIDERS: [name], [name] is PCP  - Lujean Amel, MD is cardiologist Jefm Bryant, see [place]). Last evaluation 08/11/19 for preoperative evaluation prior to hand surgery. She had a normal stress test and normal LVEF (see CV).  Renee Harder, MD is neurologist. Last visit 02/12/18.    LABS: Labs reviewed: Acceptable for surgery. (all labs ordered are listed, but only abnormal results are displayed)  Labs Reviewed  CBC - Abnormal; Notable for the following components:      Result Value   RDW 16.1 (*)    All other components within normal limits  SURGICAL PCR SCREEN  COMPREHENSIVE METABOLIC PANEL  GLUCOSE, CAPILLARY  TYPE AND SCREEN     IMAGES: MRI L-spine 01/31/20: IMPRESSION: 1. Largely stable MRI appearance of the lumbar spine since  2019. Chronically advanced disc and endplate degeneration at L5-S1 with moderate to severe L5 foraminal stenosis greater on the right. 2. No lumbar spinal or lateral recess stenosis. Up to moderate bilateral L4 foraminal stenosis appears stable.  CXR 11/17/19: FINDINGS: Stable asymmetric elevation right hemidiaphragm. Right base atelectasis or infiltrate noted with some obscuration of the right hemidiaphragm. Lungs otherwise clear. The cardiopericardial silhouette is within normal limits for size. The visualized bony structures of the thorax are intact. IMPRESSION: Asymmetric elevation right hemidiaphragm with right base atelectasis or infiltrate.   EKG: 11/17/19: Normal sinus rhythm Cannot rule out Anterior infarct , age undetermined Abnormal ECG   CV: Echo 09/01/19: IMPRESSIONS   1. Left ventricular ejection fraction, by estimation, is 55 to 60%. The  left ventricle has normal function. The left ventricle has no regional  wall motion abnormalities. Left ventricular diastolic parameters were  normal.  2. Right ventricular systolic function is normal. The right ventricular  size is normal. There is normal pulmonary artery systolic pressure.  3. The mitral valve is normal in structure. Mild mitral valve  regurgitation. No evidence of mitral stenosis.  4. The aortic valve is normal in structure. Aortic valve regurgitation is  not visualized. No aortic stenosis is present.  5. The inferior vena cava is normal in size with greater than 50%  respiratory variability, suggesting right atrial pressure of 3 mmHg.    Nuclear stress test 09/01/19:  Blood pressure demonstrated a normal response to exercise.  There was no  ST segment deviation noted during stress.  The study is normal.  This is a low risk study.  The left ventricular ejection fraction is normal (55-65%).   Cardiac cath 02/22/16:  LM lesion, 25 %stenosed.  Prox Cx lesion, 25 %stenosed.  The left  ventricular systolic function is normal.  LV end diastolic pressure is normal.  Left dominant system  Mild coronary disease in the proximal ostial circumflex as well as mid left main 25% nonobstructive Conclusion Left dominant system normal left ventricular function no evidence of significant obstructive coronary disease    Past Medical History:  Diagnosis Date  . Anxiety   . Asthma   . Atypical chest pain 08/02/2015  . CAD (coronary artery disease)   . Chronic back pain   . COPD (chronic obstructive pulmonary disease) ([place])   . Coronary artery disease    a. Mild-non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/2014  . GERD (gastroesophageal reflux disease)   . Hepatitis C    per patient, "took medicines for it and no longer has it"  . Hypercholesteremia   . Hypertension   . Hypokalemia   . Hypomagnesemia 01/04/2014  . Liver disease   . MI (myocardial infarction) ([place])   . Nerve pain    per patient, in the lower back  . Opiate use 02/27/2016  . Osteoarthritis   . Stroke [facility])     Past Surgical History:  Procedure Laterality Date  . ANTERIOR CERVICAL DECOMP/DISCECTOMY FUSION N/A 03/30/2018   Procedure: Cervical six-seven Anterior cervical decompression/discectomy/fusion;  Surgeon: Judith Part, MD;  Location: [place];  Service: Neurosurgery;  Laterality: N/A;  . ANTERIOR CERVICAL DECOMP/DISCECTOMY FUSION N/A 01/11/2019   Procedure: Cervical Five-Six Anterior cervical discectomy fusion,  Cervical Five to Cervical Seven anterior instrumented fusion;  Surgeon: Judith Part, MD;  Location: [place];  Service: Neurosurgery;  Laterality: N/A;  Cervical Five-Six Anterior cervical discectomy fusion,  Cervical Five to Cervical Seven anterior instrumented fusion  . BACK SURGERY    . CARDIAC CATHETERIZATION Left 02/22/2016   Procedure: Left Heart Cath and Coronary Angiography;  Surgeon: Yolonda Kida, MD;  Location: [place] CV LAB;  Service: Cardiovascular;  Laterality: Left;  .  CATARACT EXTRACTION W/ INTRAOCULAR LENS IMPLANT Right   . COLONOSCOPY WITH PROPOFOL N/A 09/19/2016   Procedure: COLONOSCOPY WITH PROPOFOL;  Surgeon: [name], MD;  [facility] END[facility]COPY;  Service: Endoscopy;  Laterality: N/A;  . DIAGNOSTIC LAPAROSCOPY    . ESOPHAGOGASTRODUODENOSCOPY (EGD) WITH PROPOFOL N/A 09/18/2017   Procedure: ESOPHAGOGASTRODUODENOSCOPY (EGD) WITH PROPOFOL;  Surgeon: [name], MD;  Location: [facility] ENDOSCOPY;  Service: Gastroenterology;  Laterality: N/A;  . ESOPHAGOGASTRODUODENOSCOPY (EGD) WITH PROPOFOL N/A 08/10/2019   Procedure: ESOPHAGOGASTRODUODENOSCOPY (EGD) WITH PROPOFOL;  Surgeon: [name], MD;  Location[facility] ENDOSCOPY;  Service: Endoscopy;  Laterality: N/A;  . FRACTURE SURGERY    . hemorroids    . NASAL SINUS SURGERY    . ORIF FEMUR FRACTURE    . ORIF TIBIA & FIBULA FRACTURES    . OVARY SURGERY      MEDICATIONS: . albuterol (PROVENTIL HFA;VENTOLIN HFA) 108 (90 Base) MCG/ACT inhaler  . albuterol (PROVENTIL) (2.5 MG/3ML) 0.083% nebulizer solution  . amLODipine (NORVASC) 5 MG tablet  . atorvastatin (LIPITOR) 20 MG tablet  . budesonide-formoterol (SYMBICORT) 160-4.5 MCG/ACT inhaler  . buPROPion (WELLBUTRIN SR) 150 MG 12 hr tablet  . docusate sodium (COLACE) 100 MG capsule  . DULoxetine (CYMBALTA) 60 MG capsule  . gabapentin (NEURONTIN) 600 MG tablet  . lisinopril (PRINIVIL,ZESTRIL) 40  MG tablet  . metaxalone (SKELAXIN) 800 MG tablet  . metoprolol tartrate (LOPRESSOR) 25 MG tablet  . naloxone (NARCAN) 4 MG/0.1ML LIQD nasal spray kit  . Naloxone HCl (NARCAN IJ)  . nitroGLYCERIN (NITROSTAT) 0.4 MG SL tablet  . omeprazole (PRILOSEC) 40 MG capsule  . ondansetron (ZOFRAN-ODT) 8 MG disintegrating tablet  . Oxycodone HCl 20 MG TABS  . sucralfate (CARAFATE) 1 GM/10ML suspension  . Tiotropium Bromide Monohydrate (SPIRIVA RESPIMAT) 2.5 MCG/ACT AERS   No current facility-administered medications for this encounter.  By med list she is not currently taking  Wellbutrin SR or Cymbalta.  Myra Gianotti, [name] Surgical Short Stay/Anesthesiology [facility] Phone [phone] [facility] Phone [phone] 05/17/2020 6:09 PM

## 2020-05-17 NOTE — Anesthesia Preprocedure Evaluation (Addendum)
Anesthesia Evaluation  Patient identified by MRN, date of birth, ID band Patient awake    Reviewed: Allergy & Precautions, NPO status , Patient's Chart, lab work & pertinent test results  Airway Mallampati: II  TM Distance: >3 FB Neck ROM: Full    Dental no notable dental hx.    Pulmonary COPD, Current Smoker,    Pulmonary exam normal breath sounds clear to auscultation       Cardiovascular hypertension, Pt. on medications and Pt. on home beta blockers + Past MI and + Peripheral Vascular Disease  Normal cardiovascular exam Rhythm:Regular Rate:Normal     Neuro/Psych CVA negative psych ROS   GI/Hepatic negative GI ROS, Neg liver ROS,   Endo/Other  negative endocrine ROS  Renal/GU negative Renal ROS  negative genitourinary   Musculoskeletal negative musculoskeletal ROS (+)   Abdominal   Peds negative pediatric ROS (+)  Hematology negative hematology ROS (+)   Anesthesia Other Findings   Reproductive/Obstetrics negative OB ROS                            Anesthesia Physical Anesthesia Plan  ASA: III  Anesthesia Plan: General   Post-op Pain Management:    Induction: Intravenous  PONV Risk Score and Plan: 2 and Ondansetron, Dexamethasone, Treatment may vary due to age or medical condition and Midazolam  Airway Management Planned: Oral ETT  Additional Equipment:   Intra-op Plan:   Post-operative Plan: Extubation in OR  Informed Consent: I have reviewed the patients History and Physical, chart, labs and discussed the procedure including the risks, benefits and alternatives for the proposed anesthesia with the patient or authorized representative who has indicated his/her understanding and acceptance.     Dental advisory given  Plan Discussed with: CRNA and Surgeon  Anesthesia Plan Comments: (PAT note written 05/17/2020 by Myra Gianotti, [name]. )       Anesthesia Quick  Evaluation

## 2020-05-19 ENCOUNTER — Other Ambulatory Visit: Payer: Self-pay

## 2020-05-19 ENCOUNTER — Other Ambulatory Visit
Admission: RE | Admit: 2020-05-19 | Discharge: 2020-05-19 | Disposition: A | Discharge status: Home / Self Care | Payer: Medicaid Other | Source: Ambulatory Visit | Attending: Neurological Surgery | Admitting: Neurological Surgery

## 2020-05-19 DIAGNOSIS — Z20822 Contact with and (suspected) exposure to covid-19: Secondary | ICD-10-CM | POA: Diagnosis not present

## 2020-05-19 DIAGNOSIS — Z01812 Encounter for preprocedural laboratory examination: Secondary | ICD-10-CM | POA: Insufficient documentation

## 2020-05-20 ENCOUNTER — Other Ambulatory Visit: Payer: Self-pay | Admitting: Gastroenterology

## 2020-05-20 LAB — SARS CORONAVIRUS 2 (TAT 6-24 HRS): SARS Coronavirus 2: NEGATIVE

## 2020-05-23 ENCOUNTER — Inpatient Hospital Stay (HOSPITAL_COMMUNITY)
Admission: RE | Admit: 2020-05-23 | Discharge: 2020-05-24 | DRG: 460 | Disposition: A | Discharge status: Home / Self Care | Payer: Medicaid Other | Attending: Neurological Surgery | Admitting: Neurological Surgery

## 2020-05-23 ENCOUNTER — Inpatient Hospital Stay (HOSPITAL_COMMUNITY): Payer: Medicaid Other

## 2020-05-23 ENCOUNTER — Encounter (HOSPITAL_COMMUNITY)
Admission: RE | Disposition: A | Discharge status: Home / Self Care | Payer: Self-pay | Source: Home / Self Care | Attending: Neurological Surgery

## 2020-05-23 ENCOUNTER — Other Ambulatory Visit: Payer: Self-pay

## 2020-05-23 ENCOUNTER — Inpatient Hospital Stay (HOSPITAL_COMMUNITY): Payer: Medicaid Other | Admitting: Vascular Surgery

## 2020-05-23 ENCOUNTER — Encounter (HOSPITAL_COMMUNITY): Payer: Self-pay | Admitting: Neurological Surgery

## 2020-05-23 ENCOUNTER — Inpatient Hospital Stay (HOSPITAL_COMMUNITY): Payer: Medicaid Other | Admitting: Certified Registered Nurse Anesthetist

## 2020-05-23 DIAGNOSIS — M4807 Spinal stenosis, lumbosacral region: Principal | ICD-10-CM | POA: Diagnosis present

## 2020-05-23 DIAGNOSIS — Z20822 Contact with and (suspected) exposure to covid-19: Secondary | ICD-10-CM | POA: Diagnosis present

## 2020-05-23 DIAGNOSIS — M5417 Radiculopathy, lumbosacral region: Secondary | ICD-10-CM | POA: Diagnosis present

## 2020-05-23 DIAGNOSIS — E7800 Pure hypercholesterolemia, unspecified: Secondary | ICD-10-CM | POA: Diagnosis present

## 2020-05-23 DIAGNOSIS — F17210 Nicotine dependence, cigarettes, uncomplicated: Secondary | ICD-10-CM | POA: Diagnosis present

## 2020-05-23 DIAGNOSIS — I2510 Atherosclerotic heart disease of native coronary artery without angina pectoris: Secondary | ICD-10-CM | POA: Diagnosis present

## 2020-05-23 DIAGNOSIS — Z8673 Personal history of transient ischemic attack (TIA), and cerebral infarction without residual deficits: Secondary | ICD-10-CM | POA: Diagnosis not present

## 2020-05-23 DIAGNOSIS — J449 Chronic obstructive pulmonary disease, unspecified: Secondary | ICD-10-CM | POA: Diagnosis present

## 2020-05-23 DIAGNOSIS — M51369 Other intervertebral disc degeneration, lumbar region without mention of lumbar back pain or lower extremity pain: Secondary | ICD-10-CM

## 2020-05-23 DIAGNOSIS — M5136 Other intervertebral disc degeneration, lumbar region: Secondary | ICD-10-CM

## 2020-05-23 DIAGNOSIS — Z419 Encounter for procedure for purposes other than remedying health state, unspecified: Secondary | ICD-10-CM

## 2020-05-23 DIAGNOSIS — I10 Essential (primary) hypertension: Secondary | ICD-10-CM | POA: Diagnosis present

## 2020-05-23 DIAGNOSIS — I252 Old myocardial infarction: Secondary | ICD-10-CM | POA: Diagnosis not present

## 2020-05-23 DIAGNOSIS — M5416 Radiculopathy, lumbar region: Secondary | ICD-10-CM | POA: Diagnosis present

## 2020-05-23 HISTORY — PX: TRANSFORAMINAL LUMBAR INTERBODY FUSION W/ MIS 1 LEVEL: SHX6145

## 2020-05-23 HISTORY — DX: Other intervertebral disc degeneration, lumbar region without mention of lumbar back pain or lower extremity pain: M51.369

## 2020-05-23 HISTORY — DX: Other intervertebral disc degeneration, lumbar region: M51.36

## 2020-05-23 LAB — GLUCOSE, CAPILLARY: Glucose-Capillary: 100 mg/dL — ABNORMAL HIGH (ref 70–99)

## 2020-05-23 SURGERY — MINIMALLY INVASIVE (MIS) TRANSFORAMINAL LUMBAR INTERBODY FUSION (TLIF) 1 LEVEL
Anesthesia: General | Laterality: Right

## 2020-05-23 MED ORDER — OXYCODONE HCL 5 MG/5ML PO SOLN: 5.0000 mg | Freq: Once | ORAL | Status: DC | PRN

## 2020-05-23 MED ORDER — OXYCODONE HCL 5 MG PO TABS: 5.0000 mg | ORAL_TABLET | Freq: Once | ORAL | Status: DC | PRN

## 2020-05-23 MED ORDER — ROCURONIUM BROMIDE 10 MG/ML (PF) SYRINGE
PREFILLED_SYRINGE | INTRAVENOUS | Status: DC | PRN
  Administered 2020-05-23: 30 mg via INTRAVENOUS
  Administered 2020-05-23: 70 mg via INTRAVENOUS

## 2020-05-23 MED ORDER — LISINOPRIL 20 MG PO TABS
40.0000 mg | ORAL_TABLET | Freq: Every day | ORAL | Status: DC
  Administered 2020-05-24: 40 mg via ORAL
  Filled 2020-05-23: qty 2

## 2020-05-23 MED ORDER — LIDOCAINE-EPINEPHRINE 1 %-1:100000 IJ SOLN
INTRAMUSCULAR | Status: AC
  Filled 2020-05-23: qty 1

## 2020-05-23 MED ORDER — SUCRALFATE 1 GM/10ML PO SUSP
1.0000 g | Freq: Four times a day (QID) | ORAL | Status: DC
  Administered 2020-05-23 – 2020-05-24 (×2): 1 g via ORAL
  Filled 2020-05-23 (×6): qty 10

## 2020-05-23 MED ORDER — ALBUTEROL SULFATE (2.5 MG/3ML) 0.083% IN NEBU
2.5000 mg | INHALATION_SOLUTION | Freq: Two times a day (BID) | RESPIRATORY_TRACT | Status: DC
  Administered 2020-05-24: 2.5 mg via RESPIRATORY_TRACT
  Filled 2020-05-23: qty 3

## 2020-05-23 MED ORDER — ATORVASTATIN CALCIUM 10 MG PO TABS
20.0000 mg | ORAL_TABLET | Freq: Every day | ORAL | Status: DC
  Administered 2020-05-24: 20 mg via ORAL
  Filled 2020-05-23: qty 2

## 2020-05-23 MED ORDER — ONDANSETRON HCL 4 MG/2ML IJ SOLN
INTRAMUSCULAR | Status: DC | PRN
  Administered 2020-05-23: 4 mg via INTRAVENOUS

## 2020-05-23 MED ORDER — DOCUSATE SODIUM 100 MG PO CAPS: 200.0000 mg | ORAL_CAPSULE | Freq: Every day | ORAL | Status: DC | PRN

## 2020-05-23 MED ORDER — ACETAMINOPHEN 10 MG/ML IV SOLN: 1000.0000 mg | Freq: Once | INTRAVENOUS | Status: DC | PRN

## 2020-05-23 MED ORDER — SODIUM CHLORIDE 0.9 % IV SOLN: 250.0000 mL | INTRAVENOUS | Status: DC

## 2020-05-23 MED ORDER — DEXAMETHASONE SODIUM PHOSPHATE 10 MG/ML IJ SOLN
INTRAMUSCULAR | Status: AC
  Filled 2020-05-23: qty 1

## 2020-05-23 MED ORDER — ONDANSETRON HCL 4 MG/2ML IJ SOLN
INTRAMUSCULAR | Status: AC
  Filled 2020-05-23: qty 2

## 2020-05-23 MED ORDER — NITROGLYCERIN 0.4 MG SL SUBL: 0.4000 mg | SUBLINGUAL_TABLET | SUBLINGUAL | Status: DC | PRN

## 2020-05-23 MED ORDER — GABAPENTIN 600 MG PO TABS
600.0000 mg | ORAL_TABLET | Freq: Three times a day (TID) | ORAL | Status: DC
  Administered 2020-05-23 – 2020-05-24 (×2): 600 mg via ORAL
  Filled 2020-05-23 (×4): qty 1

## 2020-05-23 MED ORDER — ACETAMINOPHEN 10 MG/ML IV SOLN
INTRAVENOUS | Status: DC | PRN
  Administered 2020-05-23: 1000 mg via INTRAVENOUS

## 2020-05-23 MED ORDER — DEXMEDETOMIDINE HCL 200 MCG/2ML IV SOLN
INTRAVENOUS | Status: DC | PRN
  Administered 2020-05-23 (×5): 4 ug via INTRAVENOUS

## 2020-05-23 MED ORDER — DEXAMETHASONE SODIUM PHOSPHATE 10 MG/ML IJ SOLN
INTRAMUSCULAR | Status: DC | PRN
  Administered 2020-05-23: 10 mg via INTRAVENOUS

## 2020-05-23 MED ORDER — FENTANYL CITRATE (PF) 250 MCG/5ML IJ SOLN
INTRAMUSCULAR | Status: AC
  Filled 2020-05-23: qty 5

## 2020-05-23 MED ORDER — PHENYLEPHRINE HCL-NACL 10-0.9 MG/250ML-% IV SOLN
INTRAVENOUS | Status: DC | PRN
  Administered 2020-05-23: 15 ug/min via INTRAVENOUS

## 2020-05-23 MED ORDER — UMECLIDINIUM BROMIDE 62.5 MCG/INH IN AEPB
1.0000 | INHALATION_SPRAY | Freq: Every day | RESPIRATORY_TRACT | Status: DC
  Administered 2020-05-24: 09:00:00 1 via RESPIRATORY_TRACT
  Filled 2020-05-23: qty 7

## 2020-05-23 MED ORDER — POLYETHYLENE GLYCOL 3350 17 G PO PACK: 17.0000 g | PACK | Freq: Every day | ORAL | Status: DC | PRN

## 2020-05-23 MED ORDER — CHLORHEXIDINE GLUCONATE CLOTH 2 % EX PADS: 6.0000 | MEDICATED_PAD | Freq: Once | CUTANEOUS | Status: DC

## 2020-05-23 MED ORDER — THROMBIN 5000 UNITS EX SOLR
OROMUCOSAL | Status: DC | PRN
  Administered 2020-05-23: 5 mL via TOPICAL

## 2020-05-23 MED ORDER — ONDANSETRON HCL 4 MG/2ML IJ SOLN: 4.0000 mg | Freq: Once | INTRAMUSCULAR | Status: DC | PRN

## 2020-05-23 MED ORDER — TIOTROPIUM BROMIDE MONOHYDRATE 2.5 MCG/ACT IN AERS: 1.0000 | INHALATION_SPRAY | Freq: Two times a day (BID) | RESPIRATORY_TRACT | Status: DC

## 2020-05-23 MED ORDER — ACETAMINOPHEN 325 MG PO TABS
650.0000 mg | ORAL_TABLET | ORAL | Status: DC | PRN
  Administered 2020-05-23: 650 mg via ORAL
  Filled 2020-05-23: qty 2

## 2020-05-23 MED ORDER — ONDANSETRON 4 MG PO TBDP
8.0000 mg | ORAL_TABLET | Freq: Two times a day (BID) | ORAL | Status: DC
  Administered 2020-05-23 – 2020-05-24 (×2): 8 mg via ORAL
  Filled 2020-05-23 (×2): qty 2

## 2020-05-23 MED ORDER — DOCUSATE SODIUM 100 MG PO CAPS
100.0000 mg | ORAL_CAPSULE | Freq: Two times a day (BID) | ORAL | Status: DC
  Administered 2020-05-23 – 2020-05-24 (×2): 100 mg via ORAL
  Filled 2020-05-23: qty 1

## 2020-05-23 MED ORDER — PROPOFOL 10 MG/ML IV BOLUS
INTRAVENOUS | Status: AC
  Filled 2020-05-23: qty 20

## 2020-05-23 MED ORDER — OXYCODONE HCL 5 MG PO TABS
20.0000 mg | ORAL_TABLET | Freq: Four times a day (QID) | ORAL | Status: DC | PRN
  Administered 2020-05-23 – 2020-05-24 (×4): 20 mg via ORAL
  Filled 2020-05-23 (×4): qty 4

## 2020-05-23 MED ORDER — HYDROMORPHONE HCL 1 MG/ML IJ SOLN
1.0000 mg | INTRAMUSCULAR | Status: DC | PRN
  Administered 2020-05-23 – 2020-05-24 (×5): 1 mg via INTRAVENOUS
  Filled 2020-05-23 (×5): qty 1

## 2020-05-23 MED ORDER — CHLORHEXIDINE GLUCONATE 0.12 % MT SOLN
15.0000 mL | Freq: Once | OROMUCOSAL | Status: AC
  Administered 2020-05-23: 15 mL via OROMUCOSAL
  Filled 2020-05-23: qty 15

## 2020-05-23 MED ORDER — ROCURONIUM BROMIDE 10 MG/ML (PF) SYRINGE
PREFILLED_SYRINGE | INTRAVENOUS | Status: AC
  Filled 2020-05-23: qty 10

## 2020-05-23 MED ORDER — LIDOCAINE 2% (20 MG/ML) 5 ML SYRINGE
INTRAMUSCULAR | Status: DC | PRN
  Administered 2020-05-23: 60 mg via INTRAVENOUS

## 2020-05-23 MED ORDER — CEFAZOLIN SODIUM-DEXTROSE 2-4 GM/100ML-% IV SOLN
2.0000 g | INTRAVENOUS | Status: AC
  Administered 2020-05-23: 2 g via INTRAVENOUS
  Filled 2020-05-23: qty 100

## 2020-05-23 MED ORDER — HYDROMORPHONE HCL 1 MG/ML IJ SOLN
0.2500 mg | INTRAMUSCULAR | Status: DC | PRN
  Administered 2020-05-23 (×4): 0.5 mg via INTRAVENOUS

## 2020-05-23 MED ORDER — THROMBIN 5000 UNITS EX SOLR
CUTANEOUS | Status: AC
  Filled 2020-05-23: qty 5000

## 2020-05-23 MED ORDER — NALOXONE HCL 0.4 MG/ML IJ SOLN: 0.4000 mg | INTRAMUSCULAR | Status: DC | PRN

## 2020-05-23 MED ORDER — HYDROMORPHONE HCL 1 MG/ML IJ SOLN
INTRAMUSCULAR | Status: AC
  Filled 2020-05-23: qty 1

## 2020-05-23 MED ORDER — ALBUTEROL SULFATE (2.5 MG/3ML) 0.083% IN NEBU
INHALATION_SOLUTION | RESPIRATORY_TRACT | Status: AC
  Administered 2020-05-23: 2.5 mg via RESPIRATORY_TRACT
  Filled 2020-05-23: qty 3

## 2020-05-23 MED ORDER — AMLODIPINE BESYLATE 5 MG PO TABS
5.0000 mg | ORAL_TABLET | Freq: Every day | ORAL | Status: DC
  Administered 2020-05-24: 5 mg via ORAL
  Filled 2020-05-23: qty 1

## 2020-05-23 MED ORDER — LIDOCAINE-EPINEPHRINE 1 %-1:100000 IJ SOLN
INTRAMUSCULAR | Status: DC | PRN
  Administered 2020-05-23: 10 mL

## 2020-05-23 MED ORDER — SODIUM CHLORIDE 0.9% FLUSH
3.0000 mL | Freq: Two times a day (BID) | INTRAVENOUS | Status: DC
  Administered 2020-05-23 – 2020-05-24 (×2): 3 mL via INTRAVENOUS

## 2020-05-23 MED ORDER — LACTATED RINGERS IV SOLN: INTRAVENOUS | Status: DC

## 2020-05-23 MED ORDER — CEFAZOLIN SODIUM-DEXTROSE 2-4 GM/100ML-% IV SOLN
2.0000 g | Freq: Three times a day (TID) | INTRAVENOUS | Status: AC
  Administered 2020-05-23 – 2020-05-24 (×2): 2 g via INTRAVENOUS
  Filled 2020-05-23 (×2): qty 100

## 2020-05-23 MED ORDER — ACETAMINOPHEN 10 MG/ML IV SOLN
INTRAVENOUS | Status: AC
  Filled 2020-05-23: qty 100

## 2020-05-23 MED ORDER — METOPROLOL TARTRATE 25 MG PO TABS
25.0000 mg | ORAL_TABLET | Freq: Two times a day (BID) | ORAL | Status: DC
  Administered 2020-05-23 – 2020-05-24 (×2): 25 mg via ORAL
  Filled 2020-05-23 (×2): qty 1

## 2020-05-23 MED ORDER — SODIUM CHLORIDE 0.9% FLUSH: 3.0000 mL | INTRAVENOUS | Status: DC | PRN

## 2020-05-23 MED ORDER — ACETAMINOPHEN 650 MG RE SUPP: 650.0000 mg | RECTAL | Status: DC | PRN

## 2020-05-23 MED ORDER — MIDAZOLAM HCL 2 MG/2ML IJ SOLN
INTRAMUSCULAR | Status: DC | PRN
  Administered 2020-05-23: 2 mg via INTRAVENOUS

## 2020-05-23 MED ORDER — MIDAZOLAM HCL 2 MG/2ML IJ SOLN
INTRAMUSCULAR | Status: AC
  Filled 2020-05-23: qty 2

## 2020-05-23 MED ORDER — MOMETASONE FURO-FORMOTEROL FUM 200-5 MCG/ACT IN AERO
2.0000 | INHALATION_SPRAY | Freq: Two times a day (BID) | RESPIRATORY_TRACT | Status: DC
  Administered 2020-05-24: 2 via RESPIRATORY_TRACT
  Filled 2020-05-23: qty 8.8

## 2020-05-23 MED ORDER — METAXALONE 800 MG PO TABS
800.0000 mg | ORAL_TABLET | Freq: Three times a day (TID) | ORAL | Status: DC
  Administered 2020-05-23 – 2020-05-24 (×2): 800 mg via ORAL
  Filled 2020-05-23 (×4): qty 1

## 2020-05-23 MED ORDER — PANTOPRAZOLE SODIUM 40 MG PO TBEC
40.0000 mg | DELAYED_RELEASE_TABLET | Freq: Every day | ORAL | Status: DC
  Administered 2020-05-24: 40 mg via ORAL
  Filled 2020-05-23: qty 1

## 2020-05-23 MED ORDER — FENTANYL CITRATE (PF) 250 MCG/5ML IJ SOLN
INTRAMUSCULAR | Status: DC | PRN
  Administered 2020-05-23: 50 ug via INTRAVENOUS
  Administered 2020-05-23: 25 ug via INTRAVENOUS
  Administered 2020-05-23: 50 ug via INTRAVENOUS
  Administered 2020-05-23: 100 ug via INTRAVENOUS
  Administered 2020-05-23: 25 ug via INTRAVENOUS
  Administered 2020-05-23: 50 ug via INTRAVENOUS

## 2020-05-23 MED ORDER — PROPOFOL 10 MG/ML IV BOLUS
INTRAVENOUS | Status: DC | PRN
  Administered 2020-05-23: 150 mg via INTRAVENOUS
  Administered 2020-05-23: 50 mg via INTRAVENOUS

## 2020-05-23 MED ORDER — ORAL CARE MOUTH RINSE: 15.0000 mL | Freq: Once | OROMUCOSAL | Status: AC

## 2020-05-23 MED ORDER — PHENOL 1.4 % MT LIQD: 1.0000 | OROMUCOSAL | Status: DC | PRN

## 2020-05-23 MED ORDER — MENTHOL 3 MG MT LOZG: 1.0000 | LOZENGE | OROMUCOSAL | Status: DC | PRN

## 2020-05-23 MED ORDER — ALBUTEROL SULFATE (2.5 MG/3ML) 0.083% IN NEBU: 2.5000 mg | INHALATION_SOLUTION | Freq: Two times a day (BID) | RESPIRATORY_TRACT | Status: DC

## 2020-05-23 MED ORDER — ONDANSETRON HCL 4 MG PO TABS: 4.0000 mg | ORAL_TABLET | Freq: Four times a day (QID) | ORAL | Status: DC | PRN

## 2020-05-23 MED ORDER — SUGAMMADEX SODIUM 200 MG/2ML IV SOLN
INTRAVENOUS | Status: DC | PRN
  Administered 2020-05-23: 200 mg via INTRAVENOUS

## 2020-05-23 MED ORDER — 0.9 % SODIUM CHLORIDE (POUR BTL) OPTIME
TOPICAL | Status: DC | PRN
  Administered 2020-05-23: 1000 mL

## 2020-05-23 MED ORDER — ALBUTEROL SULFATE HFA 108 (90 BASE) MCG/ACT IN AERS
2.0000 | INHALATION_SPRAY | Freq: Four times a day (QID) | RESPIRATORY_TRACT | Status: DC | PRN
  Filled 2020-05-23: qty 6.7

## 2020-05-23 MED ORDER — ONDANSETRON HCL 4 MG/2ML IJ SOLN
4.0000 mg | Freq: Four times a day (QID) | INTRAMUSCULAR | Status: DC | PRN
  Filled 2020-05-23: qty 2

## 2020-05-23 SURGICAL SUPPLY — 66 items
BAND RUBBER #18 3X1/16 STRL (MISCELLANEOUS) ×6 IMPLANT
BASKET BONE COLLECTION (BASKET) ×3 IMPLANT
BLADE CLIPPER SURG (BLADE) IMPLANT
BLADE SURG 11 STRL SS (BLADE) ×3 IMPLANT
BUR MATCHSTICK NEURO 3.0 LAGG (BURR) IMPLANT
BUR PRECISION FLUTE 5.0 (BURR) IMPLANT
BUR PRECISION MATCH 3.0 13 (BURR) ×2 IMPLANT
BUR PRECISION MATCH 3.0 13CM (BURR) ×1
CNTNR URN SCR LID CUP LEK RST (MISCELLANEOUS) ×1 IMPLANT
CONT SPEC 4OZ STRL OR WHT (MISCELLANEOUS) ×2
COVER BACK TABLE 60X90IN (DRAPES) ×3 IMPLANT
COVER WAND RF STERILE (DRAPES) ×3 IMPLANT
DECANTER SPIKE VIAL GLASS SM (MISCELLANEOUS) ×3 IMPLANT
DERMABOND ADVANCED (GAUZE/BANDAGES/DRESSINGS) ×2
DERMABOND ADVANCED .7 DNX12 (GAUZE/BANDAGES/DRESSINGS) ×1 IMPLANT
DEVICE INTERBODY ELEVATE 23X7 (Cage) ×3 IMPLANT
DRAPE C-ARM 42X72 X-RAY (DRAPES) ×3 IMPLANT
DRAPE C-ARMOR (DRAPES) ×3 IMPLANT
DRAPE LAPAROTOMY 100X72X124 (DRAPES) ×3 IMPLANT
DRAPE MICROSCOPE LEICA (MISCELLANEOUS) ×3 IMPLANT
DRAPE SURG 17X23 STRL (DRAPES) ×6 IMPLANT
ELECT BLADE 6.5 EXT (BLADE) ×3 IMPLANT
ELECT REM PT RETURN 9FT ADLT (ELECTROSURGICAL) ×3
ELECTRODE REM PT RTRN 9FT ADLT (ELECTROSURGICAL) ×1 IMPLANT
EXTENDER TAB GUIDE SV 5.5/6.0 (INSTRUMENTS) ×24 IMPLANT
GAUZE 4X4 16PLY RFD (DISPOSABLE) IMPLANT
GAUZE SPONGE 4X4 12PLY STRL (GAUZE/BANDAGES/DRESSINGS) ×3 IMPLANT
GLOVE BIO SURGEON STRL SZ 6.5 (GLOVE) ×2 IMPLANT
GLOVE BIO SURGEON STRL SZ7.5 (GLOVE) ×3 IMPLANT
GLOVE BIO SURGEONS STRL SZ 6.5 (GLOVE) ×1
GLOVE BIOGEL PI IND STRL 6.5 (GLOVE) ×1 IMPLANT
GLOVE BIOGEL PI IND STRL 7.5 (GLOVE) ×2 IMPLANT
GLOVE BIOGEL PI INDICATOR 6.5 (GLOVE) ×2
GLOVE BIOGEL PI INDICATOR 7.5 (GLOVE) ×4
GLOVE SURG SS PI 7.0 STRL IVOR (GLOVE) ×3 IMPLANT
GOWN STRL REUS W/ TWL LRG LVL3 (GOWN DISPOSABLE) ×2 IMPLANT
GOWN STRL REUS W/ TWL XL LVL3 (GOWN DISPOSABLE) IMPLANT
GOWN STRL REUS W/TWL 2XL LVL3 (GOWN DISPOSABLE) ×3 IMPLANT
GOWN STRL REUS W/TWL LRG LVL3 (GOWN DISPOSABLE) ×4
GOWN STRL REUS W/TWL XL LVL3 (GOWN DISPOSABLE)
GUIDEWIRE BLUNT NT 450 (WIRE) ×12 IMPLANT
HEMOSTAT POWDER KIT SURGIFOAM (HEMOSTASIS) ×3 IMPLANT
KIT BASIN OR (CUSTOM PROCEDURE TRAY) ×3 IMPLANT
KIT POSITION SURG JACKSON T1 (MISCELLANEOUS) ×3 IMPLANT
KIT TURNOVER KIT B (KITS) IMPLANT
NEEDLE BEVEL TWO-PAK W/1PK (NEEDLE) ×3 IMPLANT
NEEDLE HYPO 18GX1.5 BLUNT FILL (NEEDLE) IMPLANT
NEEDLE HYPO 22GX1.5 SAFETY (NEEDLE) ×3 IMPLANT
NEEDLE SPNL 18GX3.5 QUINCKE PK (NEEDLE) IMPLANT
NS IRRIG 1000ML POUR BTL (IV SOLUTION) ×3 IMPLANT
PACK LAMINECTOMY NEURO (CUSTOM PROCEDURE TRAY) ×3 IMPLANT
PAD ARMBOARD 7.5X6 YLW CONV (MISCELLANEOUS) ×6 IMPLANT
ROD 5.5 CCM PERC 40 (Rod) ×3 IMPLANT
ROD 5.5X45MM SOLERA VOYAGER (Rod) ×3 IMPLANT
SCREW MAS VOYAGER 6.5X35 (Screw) ×12 IMPLANT
SCREW SET 5.5/6.0MM SOLERA (Screw) ×12 IMPLANT
SPONGE LAP 4X18 RFD (DISPOSABLE) IMPLANT
SUT MNCRL AB 3-0 PS2 18 (SUTURE) ×3 IMPLANT
SUT VIC AB 0 CT1 18XCR BRD8 (SUTURE) IMPLANT
SUT VIC AB 0 CT1 8-18 (SUTURE)
SUT VIC AB 2-0 CP2 18 (SUTURE) ×6 IMPLANT
SYR 3ML LL SCALE MARK (SYRINGE) IMPLANT
TOWEL GREEN STERILE (TOWEL DISPOSABLE) ×3 IMPLANT
TOWEL GREEN STERILE FF (TOWEL DISPOSABLE) ×3 IMPLANT
TRAY FOLEY MTR SLVR 16FR STAT (SET/KITS/TRAYS/PACK) ×3 IMPLANT
WATER STERILE IRR 1000ML POUR (IV SOLUTION) ×3 IMPLANT

## 2020-05-23 NOTE — Plan of Care (Signed)

## 2020-05-23 NOTE — Anesthesia Postprocedure Evaluation (Signed)
Anesthesia Post Note  Patient: Sara Munoz  Procedure(s) Performed: Right Lumbar Five Sacral One Minimally invasive transforaminal lumbar interbody fusion (Right )     Patient location during evaluation: PACU Anesthesia Type: General Level of consciousness: awake and alert Pain management: pain level controlled Vital Signs Assessment: post-procedure vital signs reviewed and stable Respiratory status: spontaneous breathing, nonlabored ventilation, respiratory function stable and patient connected to nasal cannula oxygen Cardiovascular status: blood pressure returned to baseline and stable Postop Assessment: no apparent nausea or vomiting Anesthetic complications: no   No complications documented.  Last Vitals:  Vitals:   05/23/20 1717 05/23/20 1732  BP: (!) 160/86   Pulse: 85 87  Resp: 12 14  Temp:    SpO2: 97% 97%    Last Pain:  Vitals:   05/23/20 1711  TempSrc:   PainSc: 8                  Shyteria Lewis S

## 2020-05-23 NOTE — Op Note (Signed)
PATIENT: Sara Munoz  DAY OF SURGERY: 05/23/20   PRE-OPERATIVE DIAGNOSIS:  Lumbar radiculopathy   POST-OPERATIVE DIAGNOSIS:  Same   PROCEDURE:  Right L5-S1 minimally invasive transforaminal lumbar interbody fusion with bilateral L5-S1 pedicle screw placement   SURGEON:  Surgeon(s) and Role:    Judith Part, MD - Primary    Dawley, Pieter Partridge, MD - Assisting   ANESTHESIA: ETGA   BRIEF HISTORY: This is a 54 year old woman who presented with right greater than left lower extremity radicular symptoms. The patient was found to have right greater than left L5-S1 foraminal stenosis. Her symptoms were unfortunately not responsive to non-surgical treatment measures. This was discussed with the patient as well as risks, benefits, and alternatives and wished to proceed with surgery.   OPERATIVE DETAIL:  The patient was taken to the operating room and placed on the OR table in the prone position. A formal time out was performed with two patient identifiers and confirmed the operative site. Anesthesia was induced by the anesthesia team. The operative site was marked, hair was clipped with surgical clippers, the area was then prepped and draped in a sterile fashion.   Fluoroscopy was used to localize the surgical level. The pedicles were marked and used to create skin incisions bilaterally. With fluoro guidance, Jamshidi needles were used to guide K-wires into the bilateral L5 and S1 pedicles. The K wires were then secured with hemostats and attention turned to the TLIF.  A [employer] tube was then docked to the right L5-S1 facet through the same incision using fluoroscopy. A right L5-S1 facetectomy was performed and the right L5 nerve root was decompressed along its entire course. The disc space was identified, incised, and a discectomy was performed in the standard fashion. The endplates were prepped, bone graft was packed into the disc space, and an expandable cage ([employer]) was packed with autograft and  placed into the disc space with fluoroscopic confirmation. The tube was removed and hemostasis was obtained during its removal.   Using the previously placed K wires, a tap and then screw with tower were placed bilaterally at L5 and S1. A rod was sized and introduced on both sides, confirmed with fluoroscopy, then final tightened. Hemostasis was again confirmed for both incisions, they were copiously irrigated, and then closed in layers.    EBL:  48mL   DRAINS: none   SPECIMENS: none   Judith Part, MD 05/23/20 1:39 PM

## 2020-05-23 NOTE — Progress Notes (Signed)
Patient admitted to room. Alert and oriented. Back pain and prn oxycodone given. Skin dry and warm to touch without any issue. Surgical sites to back dry and clean.

## 2020-05-23 NOTE — Anesthesia Procedure Notes (Signed)
Procedure Name: Intubation Date/Time: 05/23/2020 1:46 PM Performed by: Clearnce Sorrel, CRNA Pre-anesthesia Checklist: Patient identified, Emergency Drugs available, Suction available, Patient being monitored and Timeout performed Patient Re-evaluated:Patient Re-evaluated prior to induction Oxygen Delivery Method: Circle system utilized Preoxygenation: Pre-oxygenation with 100% oxygen Induction Type: IV induction Ventilation: Mask ventilation without difficulty Laryngoscope Size: Mac and 3 Grade View: Grade I Tube type: Oral Tube size: 7.0 mm Number of attempts: 1 Airway Equipment and Method: Stylet Placement Confirmation: ETT inserted through vocal cords under direct vision,  positive ETCO2 and breath sounds checked- equal and bilateral Secured at: 22 cm Tube secured with: Tape Dental Injury: Teeth and Oropharynx as per pre-operative assessment

## 2020-05-23 NOTE — Transfer of Care (Signed)
Immediate Anesthesia Transfer of Care Note  Patient: Sara Munoz  Procedure(s) Performed: Right Lumbar Five Sacral One Minimally invasive transforaminal lumbar interbody fusion (Right )  Patient Location: PACU  Anesthesia Type:General  Level of Consciousness: awake, alert  and oriented  Airway & Oxygen Therapy: Patient Spontanous Breathing  Post-op Assessment: Report given to RN and Post -op Vital signs reviewed and stable  Post vital signs: Reviewed and stable  Last Vitals:  Vitals Value Taken Time  BP 156/84 05/23/20 1702  Temp    Pulse 88 05/23/20 1705  Resp 16 05/23/20 1705  SpO2 95 % 05/23/20 1705  Vitals shown include unvalidated device data.  Last Pain:  Vitals:   05/23/20 1108  TempSrc:   PainSc: 7       Patients Stated Pain Goal: 3 (17/91/50 5697)  Complications: No complications documented.

## 2020-05-23 NOTE — H&P (Signed)
Surgical H&P Update  HPI: 54 y.o. woman with severe lumbar radiculopathy, here for L5-S1 MIS TLIF. Her symptoms consist of low back and bilateral leg pain on the right greater than left. MRI showed progression of her L5-S1 foraminal stenosis. Her symptoms were unfortunately refractory to non-surgical treatment measures, I therefore recommended surgical treatment. No changes in health since she was last seen. Still having leg and low back pain and wishes to proceed with surgery.  PMHx:  Past Medical History:  Diagnosis Date  . Anxiety   . Asthma   . Atypical chest pain 08/02/2015  . CAD (coronary artery disease)   . Chronic back pain   . COPD (chronic obstructive pulmonary disease) ([place])   . Coronary artery disease    a. Mild-non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/2014  . GERD (gastroesophageal reflux disease)   . Hepatitis C    per patient, "took medicines for it and no longer has it"  . Hypercholesteremia   . Hypertension   . Hypokalemia   . Hypomagnesemia 01/04/2014  . Liver disease   . MI (myocardial infarction) ([place])   . Nerve pain    per patient, in the lower back  . Opiate use 02/27/2016  . Osteoarthritis   . Stroke [facility])    FamHx:  Family History  Problem Relation Age of Onset  . Heart disease Mother   . Lung cancer Mother   . Ovarian cancer Mother   . Healthy Brother   . Healthy Brother   . Healthy Brother   . Diabetes Maternal Uncle   . Breast cancer Maternal Aunt    SocHx:  reports that she has been smoking cigarettes. She has a 35.00 pack-year smoking history. She has never used smokeless tobacco. She reports previous alcohol use. She reports previous drug use. Drug: Marijuana.  Physical Exam: AOx3, PERRL, FS, TM  Strength 5/5 x4, SILTx4 except bilateral L5 R>L numbness  Assesment/Plan: 54 y.o. woman with symptomatic L5-S1 foraminal stenosis, right greater than left, here for right L5-S1 MIS TLIF. Risks, benefits, and alternatives discussed and the patient would like  to continue with surgery.  -OR today -3C post-op  Judith Part, MD 05/23/20 12:43 PM

## 2020-05-23 NOTE — Brief Op Note (Signed)
05/23/2020  4:52 PM  PATIENT:  Sara Munoz  54 y.o. female  PRE-OPERATIVE DIAGNOSIS:  Lumbar radiculopathy  POST-OPERATIVE DIAGNOSIS:  Lumbar radiculopathy  PROCEDURE:  Procedure(s) with comments: Right Lumbar Five Sacral One Minimally invasive transforaminal lumbar interbody fusion (Right) - Right Lumbar Five Sacral One Minimally invasive transforaminal lumbar interbody fusion  SURGEON:  Surgeon(s) and Role:    * Wendi Lastra, Joyice Faster, MD - Primary    * Dawley, Theodoro Doing, DO - Assisting  PHYSICIAN ASSISTANT:   ANESTHESIA:   general  EBL:  50 mL   BLOOD ADMINISTERED:none  DRAINS: none   LOCAL MEDICATIONS USED:  LIDOCAINE   SPECIMEN:  No Specimen  DISPOSITION OF SPECIMEN:  N/A  COUNTS:  YES  TOURNIQUET:  * No tourniquets in log *  DICTATION: .Note written in EPIC  PLAN OF CARE: Admit to inpatient   PATIENT DISPOSITION:  PACU - hemodynamically stable.   Delay start of Pharmacological VTE agent (>24hrs) due to surgical blood loss or risk of bleeding: yes

## 2020-05-24 ENCOUNTER — Ambulatory Visit: Payer: Medicaid Other | Admitting: Gastroenterology

## 2020-05-24 ENCOUNTER — Encounter (HOSPITAL_COMMUNITY): Payer: Self-pay | Admitting: Neurological Surgery

## 2020-05-24 MED ORDER — OXYCODONE HCL 20 MG PO TABS: 20.0000 mg | ORAL_TABLET | Freq: Three times a day (TID) | ORAL | 0 refills | Status: DC

## 2020-05-24 MED ORDER — OXYCODONE HCL 5 MG PO TABS
20.0000 mg | ORAL_TABLET | Freq: Four times a day (QID) | ORAL | 0 refills | Status: DC | PRN
Start: 2020-05-24 — End: 2021-05-19

## 2020-05-24 NOTE — Plan of Care (Signed)
Patient has had severe pain this shift oxycodone and Iv dilaudid administered with minimal relief. Foley discontinued, patient got up to the bedside commode. Movement limited by pain. Problem: Education: Goal: Knowledge of General Education information will improve Description: Including pain rating scale, medication(s)/side effects and non-pharmacologic comfort measures Outcome: Progressing   Problem: Health Behavior/Discharge Planning: Goal: Ability to manage health-related needs will improve Outcome: Progressing   Problem: Clinical Measurements: Goal: Ability to maintain clinical measurements within normal limits will improve Outcome: Progressing Goal: Will remain free from infection Outcome: Progressing Goal: Diagnostic test results will improve Outcome: Progressing Goal: Respiratory complications will improve Outcome: Progressing Goal: Cardiovascular complication will be avoided Outcome: Progressing   Problem: Activity: Goal: Risk for activity intolerance will decrease Outcome: Progressing   Problem: Nutrition: Goal: Adequate nutrition will be maintained Outcome: Progressing   Problem: Coping: Goal: Level of anxiety will decrease Outcome: Progressing   Problem: Elimination: Goal: Will not experience complications related to bowel motility Outcome: Progressing Goal: Will not experience complications related to urinary retention Outcome: Progressing   Problem: Pain Managment: Goal: General experience of comfort will improve Outcome: Progressing   Problem: Safety: Goal: Ability to remain free from injury will improve Outcome: Progressing   Problem: Skin Integrity: Goal: Risk for impaired skin integrity will decrease Outcome: Progressing

## 2020-05-24 NOTE — Discharge Instructions (Signed)
Discharge Instructions  No restriction in activities, slowly increase your activity back to normal.   Your incision is closed with dermabond (purple glue). This will naturally fall off over the next 1-2 weeks.   Okay to shower on the day of discharge. Use regular soap and water and try to be gentle when cleaning your incision.   Follow up with Dr. Zada Finders in 2 weeks after discharge. If you do not already have a discharge appointment, please call his office at [phone] to schedule a follow up appointment. If you have any concerns or questions, please call the office and let us know.  I have prescribed a refill of your oxycodone, given your recent surgery. Since you have a pain contract with your pain management provider, please let them know that they can contact me if they have any concerns or questions.

## 2020-05-24 NOTE — Discharge Summary (Signed)
Discharge Summary  Date of Admission: 05/23/2020  Date of Discharge: 05/24/20  Attending Physician: Emelda Brothers, MD  Hospital Course: Patient was admitted following an uncomplicated right P8-F8 MIS TLIF. She was recovered in PACU and transferred to a regular nursing floor. Her hospital course was uncomplicated and the patient was discharged home on 05/24/20. She will follow up in clinic with me in 2 weeks.  Neurologic exam at discharge:  AOx3, PERRL, EOMI, FS, TM Strength 5/5 x4, SILTx4 except stable bilateral preop L5 numbness  Discharge diagnosis: Lumbar radiculopathy  Judith Part, MD 05/24/20 8:53 AM

## 2020-05-24 NOTE — Progress Notes (Signed)
Neurosurgery Service Progress Note  Subjective: No acute events overnight. Lower extremity radicular pain improved from preop, numbness improving, expected low back pain but tolerable with medications  Objective: Vitals:   05/23/20 2018 05/23/20 2118 05/24/20 0118 05/24/20 0518  BP: 121/81 123/71 131/78 133/72  Pulse: 91 88 83 81  Resp: 16 16 18 18   Temp: 98.3 F (36.8 C) 98.7 F (37.1 C) 98.5 F (36.9 C) 98.2 F (36.8 C)  TempSrc: Oral Oral Oral Oral  SpO2: 93% 96% 96% 98%  Weight:      Height:        Physical Exam: AOx3, PERRL, EOMI, FS, TM, Strength 5/5 x4, SILTx4 except bilateral L5 distribution numbness, incisions c/d/i  Assessment & Plan: 54 y.o. woman s/p R L5-S1 MIS TLIF, recovering well.  -discharge home today  Judith Part  05/24/20 8:26 AM

## 2020-05-24 NOTE — Evaluation (Signed)
Physical Therapy Evaluation Patient Details Name: Sara Munoz MRN: [id number] DOB: May 04, 1966 Today's Date: 05/24/2020   History of Present Illness  This is a 54 year old woman who presented with right greater than left lower extremity radicular symptoms. The patient was found to have right greater than left L5-S1 foraminal stenosis; now s/p Right L5-S1 minimally invasive transforaminal lumbar interbody fusion with bilateral L5-S1 pedicle screw placement; per orders, no brace needed  Clinical Impression   Patient evaluated by Physical Therapy with no further acute PT needs identified. All education has been completed and the patient has no further questions.  See below for any follow-up Physical Therapy or equipment needs. PT is signing off. Thank you for this referral.     Follow Up Recommendations Outpatient PT (The potential need for Outpatient PT can be addressed at Neurosurgery follow-up appointments. )    Equipment Recommendations  Rolling walker with 5" wheels    Recommendations for Other Services       Precautions / Restrictions Precautions Precautions: Back Precaution Booklet Issued: Yes (comment) Precaution Comments: Able to verbalize 3/3 precautions; per orders, no brace needed Required Braces or Orthoses:  (no brace) Restrictions Weight Bearing Restrictions: No      Mobility  Bed Mobility               General bed mobility comments: Discussed Log roll technique and demonstrated for pt    Transfers Overall transfer level: Needs assistance Equipment used: Rolling walker (2 wheeled) Transfers: Sit to/from Stand Sit to Stand: Supervision         General transfer comment: Tends to pull up on RW despite cues to push up from armrests  Ambulation/Gait Ambulation/Gait assistance: Supervision;Modified independent (Device/Increase time) Gait Distance (Feet): 120 Feet Assistive device: Rolling walker (2 wheeled) Gait Pattern/deviations: Step-through  pattern     General Gait Details: Presses down heavily on RW; cues to relax shoulders  Stairs Stairs: Yes Stairs assistance: Min assist Stair Management: Backwards;Step to pattern (with bil UE support) Number of Stairs: 2 General stair comments: Discussed optins for managing going up stairs with brother's assist; pt opted to ascend backwards, and we used the shelf-arm technique, which she is confident she can explain to her brother  Wheelchair Mobility    Modified Rankin (Stroke Patients Only)       Balance Overall balance assessment: Needs assistance Sitting-balance support: No upper extremity supported;Feet supported Sitting balance-Leahy Scale: Normal     Standing balance support: During functional activity;Bilateral upper extremity supported;Single extremity supported Standing balance-Leahy Scale: Fair Standing balance comment: fair static standing for ADLs, use of B UE support for mobility due to pain                             Pertinent Vitals/Pain Pain Assessment: Faces Faces Pain Scale: Hurts even more Pain Location: low back Pain Descriptors / Indicators: Discomfort;Sore;Operative site guarding Pain Intervention(s): Monitored during session;Premedicated before session    [place] expects to be discharged to:: Private residence Living Arrangements: Other (Comment) (Brother) Available Help at Discharge: Family;Available 24 hours/day Type of Home: House Home Access: Stairs to enter Entrance Stairs-Rails: None (none on front, rails on back steps) Entrance Stairs-Number of Steps: 2 steps through front (no rails), 5 steps in back door with rails Home Layout: One level Home Equipment: None      Prior Function Level of Independence: Independent         Comments: Pt  reports able to complete ADLs, iADLs and mobility without AD but limited by back pain.      Hand Dominance   Dominant Hand: Right    Extremity/Trunk Assessment    Upper Extremity Assessment Upper Extremity Assessment: Overall WFL for tasks assessed    Lower Extremity Assessment Lower Extremity Assessment: Overall WFL for tasks assessed    Cervical / Trunk Assessment Cervical / Trunk Assessment: Normal  Communication   Communication: No difficulties  Cognition Arousal/Alertness: Awake/alert Behavior During Therapy: WFL for tasks assessed/performed Overall Cognitive Status: Within Functional Limits for tasks assessed                                        General Comments General comments (skin integrity, edema, etc.): Provided back precautions handout, with 3 teach back attempts, pt able to recall 3/3 back precautions    Exercises     Assessment/Plan    PT Assessment All further PT needs can be met in the next venue of care  PT Problem List Decreased activity tolerance;Decreased knowledge of use of DME;Decreased knowledge of precautions;Pain       PT Treatment Interventions      PT Goals (Current goals can be found in the Care Plan section)  Acute Rehab PT Goals Patient Stated Goal: home today PT Goal Formulation: All assessment and education complete, DC therapy    Frequency     Barriers to discharge        Co-evaluation               AM-PAC PT "6 Clicks" Mobility  Outcome Measure Help needed turning from your back to your side while in a flat bed without using bedrails?: None Help needed moving from lying on your back to sitting on the side of a flat bed without using bedrails?: None Help needed moving to and from a bed to a chair (including a wheelchair)?: None Help needed standing up from a chair using your arms (e.g., wheelchair or bedside chair)?: None Help needed to walk in hospital room?: None Help needed climbing 3-5 steps with a railing? : None 6 Click Score: 24    End of Session   Activity Tolerance: Patient tolerated treatment well Patient left: in chair;with call bell/phone within  reach Nurse Communication: Mobility status PT Visit Diagnosis: Other abnormalities of gait and mobility (R26.89);Pain Pain - Right/Left:  (Back) Pain - part of body:  (Back)    Time: 4695-0722 PT Time Calculation (min) (ACUTE ONLY): 10 min   Charges:   PT Evaluation $PT Eval Low Complexity: [place], PT  Acute Rehabilitation Services Pager [phone] Office [phone]   [place] 05/24/2020, 12:09 PM

## 2020-05-24 NOTE — Evaluation (Addendum)
Occupational Therapy Evaluation Patient Details Name: Sara Munoz MRN: [id number] DOB: 13-Nov-1965 Today's Date: 05/24/2020    History of Present Illness This is a 54 year old woman who presented with right greater than left lower extremity radicular symptoms. The patient was found to have right greater than left L5-S1 foraminal stenosis; now s/p Right L5-S1 minimally invasive transforaminal lumbar interbody fusion with bilateral L5-S1 pedicle screw placement; per orders, no brace needed   Clinical Impression   PTA, pt Independent with all ADLs, IADLs and mobility without use of AD. Pt presents now with minor deficits in Independence secondary to pain but overall able to demonstrate activities without physical assist. Provided handout and educated on back precautions during ADLs/IADLs. Pt does require Supervision for LB ADLs and transitional movements due to need for cues to maintain spinal precautions. With 3rd opportunity for teach back, pt then able to recall 3/3 spinal precautions. Encouraged pt to place [facility] over toilet to increase ease of transfers, have family present during tub transfers and can use personal LSO brace as a reminder to follow back precautions (if this is allowed per MD). Pt later observed ambulating in hallway Modified Independent with RW. No further skilled OT services needed at this time. OT to sign off.     Follow Up Recommendations  No OT follow up    Equipment Recommendations  3 in 1 bedside commode;Other (comment) (RW)    Recommendations for Other Services       Precautions / Restrictions Precautions Precautions: Back Precaution Booklet Issued: Yes (comment) Precaution Comments: per orders, no brace needed Required Braces or Orthoses:  (no brace) Restrictions Weight Bearing Restrictions: No      Mobility Bed Mobility               General bed mobility comments: received sitting EOB    Transfers Overall transfer level: Needs  assistance Equipment used: Rolling walker (2 wheeled) Transfers: Sit to/from Stand Sit to Stand: Supervision         General transfer comment: Supervision. pt initially doing a twist/turn to stand with one side of walker. Cued that this is breaking spinal precautions.  Educated to push up from surface, keep back straight with pt able to demo with minor cues. Pt later observed walking down hallway with RW Mod I     Balance Overall balance assessment: Needs assistance Sitting-balance support: No upper extremity supported;Feet supported Sitting balance-Leahy Scale: Normal     Standing balance support: During functional activity;Bilateral upper extremity supported;Single extremity supported Standing balance-Leahy Scale: Fair Standing balance comment: fair static standing for ADLs, use of B UE support for mobility due to pain                           ADL either performed or assessed with clinical judgement   ADL Overall ADL's : Needs assistance/impaired Eating/Feeding: Independent;Sitting   Grooming: Supervision/safety;Standing   Upper Body Bathing: Supervision/ safety;Sitting   Lower Body Bathing: Supervison/ safety;Sit to/from stand   Upper Body Dressing : Sitting;Independent Upper Body Dressing Details (indicate cue type and reason): able to don bra, shirt sitting EOB Lower Body Dressing: Supervision/safety;Sit to/from stand Lower Body Dressing Details (indicate cue type and reason): Supervision for donning pants/shoes, cues for positioning and techniques to maintain back precautions Toilet Transfer: Supervision/safety;Ambulation;RW Toilet Transfer Details (indicate cue type and reason): simulated in room to chair with RW Toileting- Clothing Manipulation and Hygiene: Supervision/safety;Sit to/from stand  Functional mobility during ADLs: Supervision/safety;Rolling walker General ADL Comments: Overall Supervision for cues of body mechanics and back precautions.  No LOB or overt safety concerns     Vision Baseline Vision/History: Wears glasses Wears Glasses: Reading only Patient Visual Report: No change from baseline Vision Assessment?: No apparent visual deficits Additional Comments: able to read [employer] app on phone with reading glasses     Perception     Praxis      Pertinent Vitals/Pain Pain Assessment: Faces Faces Pain Scale: Hurts little more Pain Location: low back Pain Descriptors / Indicators: Discomfort;Sore;Operative site guarding Pain Intervention(s): Monitored during session;Premedicated before session     Hand Dominance Right   Extremity/Trunk Assessment Upper Extremity Assessment Upper Extremity Assessment: Overall WFL for tasks assessed   Lower Extremity Assessment Lower Extremity Assessment: Defer to PT evaluation   Cervical / Trunk Assessment Cervical / Trunk Assessment: Normal   Communication Communication Communication: No difficulties   Cognition Arousal/Alertness: Awake/alert Behavior During Therapy: WFL for tasks assessed/performed Overall Cognitive Status: Within Functional Limits for tasks assessed                                     General Comments  Provided back precautions handout, with 3 teach back attempts, pt able to recall 3/3 back precautions    Exercises     Shoulder Instructions      Home Living Family/patient expects to be discharged to:: Private residence Living Arrangements: Spouse/significant other;Other (Comment) (brother) Available Help at Discharge: Family;Available 24 hours/day Type of Home: House Home Access: Stairs to enter [employer] of Steps: 2 steps through front (no rails), 5 steps in back door with rails Entrance Stairs-Rails: None (none on front, rails on back steps) Home Layout: One level     Bathroom Shower/Tub: Teacher, early years/pre: Standard     Home Equipment: None          Prior Functioning/Environment Level of  Independence: Independent        Comments: Pt reports able to complete ADLs, iADLs and mobility without AD but limited by back pain.         OT Problem List:        OT Treatment/Interventions:      OT Goals(Current goals can be found in the care plan section) Acute Rehab OT Goals OT Goal Formulation: All assessment and education complete, DC therapy  OT Frequency:     Barriers to D/C:            Co-evaluation              AM-PAC OT "6 Clicks" Daily Activity     Outcome Measure Help from another person eating meals?: None Help from another person taking care of personal grooming?: A Little Help from another person toileting, which includes using toliet, bedpan, or urinal?: A Little Help from another person bathing (including washing, rinsing, drying)?: A Little Help from another person to put on and taking off regular upper body clothing?: None Help from another person to put on and taking off regular lower body clothing?: A Little 6 Click Score: 20   End of Session Equipment Utilized During Treatment: Rolling walker Nurse Communication: Mobility status;Other (comment) (pain meds question)  Activity Tolerance: Patient tolerated treatment well Patient left: in chair;with call bell/phone within reach  Time: 6916-7561 OT Time Calculation (min): 13 min Charges:  OT General Charges $OT Visit: 1 Visit OT Evaluation $OT Eval Low Complexity: 1 Low  Layla Maw, OTR/L  Layla Maw 05/24/2020, 10:06 AM

## 2020-05-25 ENCOUNTER — Telehealth: Payer: Self-pay

## 2020-05-25 NOTE — Telephone Encounter (Signed)
Transition Care Management Unsuccessful Follow-up Telephone Call  Date of discharge and from where:  [facility]/24/2020 [facility]  Attempts:  1st Attempt  Reason for unsuccessful TCM follow-up call:  Left voice message

## 2020-05-26 NOTE — Telephone Encounter (Signed)
Transition Care Management Unsuccessful Follow-up Telephone Call  Date of discharge and from where:  05/24/2020 [facility]  Attempts:  2nd Attempt  Reason for unsuccessful TCM follow-up call:  Left voice message

## 2020-05-29 NOTE — Telephone Encounter (Signed)
Transition Care Management Unsuccessful Follow-up Telephone Call  Date of discharge and from where:  05/24/2020 [facility]   Attempts:  3rd Attempt  Reason for unsuccessful TCM follow-up call:  Unable to leave message

## 2020-06-13 ENCOUNTER — Other Ambulatory Visit: Payer: Self-pay | Admitting: Physician Assistant

## 2020-06-13 DIAGNOSIS — R110 Nausea: Secondary | ICD-10-CM

## 2020-06-13 NOTE — Telephone Encounter (Signed)
Requested medication (s) are due for refill today: yes  Requested medication (s) are on the active medication list: yes  Last refill:  05/01/20  Future visit scheduled: yes  Notes to clinic:  med not delegated to NT to RF   Requested Prescriptions  Pending Prescriptions Disp Refills   ondansetron (ZOFRAN-ODT) 8 MG disintegrating tablet [Pharmacy Med Name: ONDANSETRON ODT 8 MG TABLET] 20 tablet 1    Sig: TAKE 1 TABLET (8 MG TOTAL) BY MOUTH 2 (TWO) TIMES DAILY.      Not Delegated - Gastroenterology: Antiemetics Failed - 06/13/2020 11:46 AM      Failed - This refill cannot be delegated      Passed - Valid encounter within last 6 months    Recent Outpatient Visits           2 months ago Encounter for tobacco use cessation counseling   [place], [place], [place]   4 months ago Chronic nausea   [place], [place], [place]   10 months ago Pre-op examination   [place], [place], [place]   10 months ago Hypercholesterolemia   [facility] [place], Wendee Beavers, [place]   1 year ago Hospital discharge follow-up   [facility] [name], [place]       Future Appointments             In 1 month [name], MD [place]   In 3 months [name], [place], [place]

## 2020-07-02 ENCOUNTER — Other Ambulatory Visit: Payer: Self-pay | Admitting: Gastroenterology

## 2020-07-04 ENCOUNTER — Other Ambulatory Visit: Payer: Self-pay

## 2020-07-04 MED ORDER — OMEPRAZOLE 40 MG PO CPDR
40.0000 mg | DELAYED_RELEASE_CAPSULE | Freq: Two times a day (BID) | ORAL | 3 refills | Status: DC
Start: 2020-07-04 — End: 2021-03-27

## 2020-07-19 ENCOUNTER — Other Ambulatory Visit: Payer: Self-pay | Admitting: Physician Assistant

## 2020-07-19 ENCOUNTER — Ambulatory Visit: Payer: Medicaid Other | Admitting: Gastroenterology

## 2020-07-19 DIAGNOSIS — R110 Nausea: Secondary | ICD-10-CM

## 2020-07-19 NOTE — Telephone Encounter (Signed)
Requested medication (s) are due for refill today: yes  Requested medication (s) are on the active medication list: yes  Last refill:  05/25/20 #20 with 1 refill  Future visit scheduled: yes  Notes to clinic:  Please review for refill. Refill not delegated per protocol    Requested Prescriptions  Pending Prescriptions Disp Refills   ondansetron (ZOFRAN-ODT) 8 MG disintegrating tablet [Pharmacy Med Name: ONDANSETRON ODT 8 MG TABLET] 20 tablet 1    Sig: TAKE 1 TABLET (8 MG TOTAL) BY MOUTH 2 (TWO) TIMES DAILY.      Not Delegated - Gastroenterology: Antiemetics Failed - 07/19/2020  2:53 PM      Failed - This refill cannot be delegated      Passed - Valid encounter within last 6 months    Recent Outpatient Visits           3 months ago Encounter for tobacco use cessation counseling   [facility], [place], [place]   5 months ago Chronic nausea   [place], [place], [place]   11 months ago Pre-op examination   [place], [place], [place]   11 months ago Hypercholesterolemia   [facility] [place], Wendee Bea[place], [place]   1 year ago Hospital discharge follow-up   [facility] [name], [place]       Future Appointments             In 1 month [name], MD [place]   In 2 months [name], [place], [place]

## 2020-07-21 NOTE — Telephone Encounter (Signed)
L.O.V. was on 04/03/2020 and next appointment is on 10/03/2020. Please advise.

## 2020-08-16 ENCOUNTER — Other Ambulatory Visit: Payer: Self-pay | Admitting: Physician Assistant

## 2020-08-16 DIAGNOSIS — I10 Essential (primary) hypertension: Secondary | ICD-10-CM

## 2020-08-22 ENCOUNTER — Other Ambulatory Visit: Payer: Self-pay | Admitting: Physician Assistant

## 2020-08-22 DIAGNOSIS — R110 Nausea: Secondary | ICD-10-CM

## 2020-08-22 NOTE — Telephone Encounter (Signed)
Requested medication (s) are due for refill today: yes  Requested medication (s) are on the active medication list:  yes  Last refill:  08/05/2020  Future visit scheduled: yes  Notes to clinic:  this refill cannot be delegated    Requested Prescriptions  Pending Prescriptions Disp Refills   ondansetron (ZOFRAN-ODT) 8 MG disintegrating tablet [Pharmacy Med Name: ONDANSETRON ODT 8 MG TABLET] 20 tablet 1    Sig: TAKE 1 TABLET (8 MG TOTAL) BY MOUTH 2 (TWO) TIMES DAILY.      Not Delegated - Gastroenterology: Antiemetics Failed - 08/22/2020  1:17 PM      Failed - This refill cannot be delegated      Passed - Valid encounter within last 6 months    Recent Outpatient Visits           4 months ago Encounter for tobacco use cessation counseling   [place], [place], [place]   6 months ago Chronic nausea   [facility] [place], Wendee Bea[place], [place]   1 year ago Pre-op examination   [facility] [place], Wendee Bea[place], [place]   1 year ago Hypercholesterolemia   [facility] [name], [place]   1 year ago Hospital discharge follow-up   [facility] [name], [place]       Future Appointments             In 3 weeks [name], MD [place]   In 1 month [facility], [place] Bea[place], [place], [facility]

## 2020-08-23 ENCOUNTER — Encounter: Payer: Medicaid Other | Admitting: Physician Assistant

## 2020-08-23 NOTE — Progress Notes (Deleted)
Complete physical exam   Patient: Sara Munoz   DOB: 03/21/1966   55 y.o. Female  MRN: [id number] Visit Date: 08/23/2020  Today's healthcare provider: [name], [name]   No chief complaint on file.  Subjective    Sara Munoz is a 55 y.o. female who presents today for a complete physical exam.  She reports consuming a {diet types:17450} diet. {Exercise:19826} She generally feels {well/fairly well/poorly:18703}. She reports sleeping {well/fairly well/poorly:18703}. She {does/does not:200015} have additional problems to discuss today.  HPI  ***  Past Medical History:  Diagnosis Date  . Anxiety   . Asthma   . Atypical chest pain 08/02/2015  . CAD (coronary artery disease)   . Chronic back pain   . COPD (chronic obstructive pulmonary disease) ([place])   . Coronary artery disease    a. Mild-non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/2014  . GERD (gastroesophageal reflux disease)   . Hepatitis C    per patient, "took medicines for it and no longer has it"  . Hypercholesteremia   . Hypertension   . Hypokalemia   . Hypomagnesemia 01/04/2014  . Liver disease   . MI (myocardial infarction) ([place])   . Nerve pain    per patient, in the lower back  . Opiate use 02/27/2016  . Osteoarthritis   . Stroke [facility])    Past Surgical History:  Procedure Laterality Date  . ANTERIOR CERVICAL DECOMP/DISCECTOMY FUSION N/A 03/30/2018   Procedure: Cervical six-seven Anterior cervical decompression/discectomy/fusion;  Surgeon: Judith Part, MD;  Location: [place];  Service: Neurosurgery;  Laterality: N/A;  . ANTERIOR CERVICAL DECOMP/DISCECTOMY FUSION N/A 01/11/2019   Procedure: Cervical Five-Six Anterior cervical discectomy fusion,  Cervical Five to Cervical Seven anterior instrumented fusion;  Surgeon: Judith Part, MD;  Location: [place];  Service: Neurosurgery;  Laterality: N/A;  Cervical Five-Six Anterior cervical discectomy fusion,  Cervical Five to Cervical Seven anterior instrumented  fusion  . BACK SURGERY    . CARDIAC CATHETERIZATION Left 02/22/2016   Procedure: Left Heart Cath and Coronary Angiography;  Surgeon: Yolonda Kida, MD;  Location: [place] CV LAB;  Service: Cardiovascular;  Laterality: Left;  . CATARACT EXTRACTION W/ INTRAOCULAR LENS IMPLANT Right   . COLONOSCOPY WITH PROPOFOL N/A 09/19/2016   Procedure: COLONOSCOPY WITH PROPOFOL;  Surgeon: [name], MD;  Location: [facility] ENDOSCOPY;  Service: Endoscopy;  Laterality: N/A;  . DIAGNOSTIC LAPAROSCOPY    . ESOPHAGOGASTRODUODENOSCOPY (EGD) WITH PROPOFOL N/A 09/18/2017   Procedure: ESOPHAGOGASTRODUODENOSCOPY (EGD) WITH PROPOFOL;  Surgeon: [name], MD;  Location: [facility] ENDOSCOPY;  Service: Gastroenterology;  Laterality: N/A;  . ESOPHAGOGASTRODUODENOSCOPY (EGD) WITH PROPOFOL N/A 08/10/2019   Procedure: ESOPHAGOGASTRODUODENOSCOPY (EGD) WITH PROPOFOL;  Surgeon: [name], MD;  Location: [facility] ENDOSCOPY;  Service: Endoscopy;  Laterality: N/A;  . FRACTURE SURGERY    . hemorroids    . NASAL SINUS SURGERY    . ORIF FEMUR FRACTURE    . ORIF TIBIA & FIBULA FRACTURES    . OVARY SURGERY    . TRANSFORAMINAL LUMBAR INTERBODY FUSION W/ MIS 1 LEVEL Right 05/23/2020   Procedure: Right Lumbar Five Sacral One Minimally invasive transforaminal lumbar interbody fusion;  Surgeon: Judith Part, MD;  Location: [place];  Service: Neurosurgery;  Laterality: Right;  Right Lumbar Five Sacral One Minimally invasive transforaminal lumbar interbody fusion   Social History   Socioeconomic History  . Marital status: Widowed    Spouse name: Not on file  . Number of children: 0  . Years  of education: Not on file  . Highest education level: 9th grade  Occupational History  . Occupation: disabled  Tobacco Use  . Smoking status: Current Every Day Smoker    Packs/day: 1.00    Years: 35.00    Pack years: 35.00    Types: Cigarettes  . Smokeless tobacco: Never Used  . Tobacco comment: 07/2019- 1.5 a day  Vaping Use  . Vaping  Use: Former  Substance and Sexual Activity  . Alcohol use: Not Currently    Alcohol/week: 0.0 standard drinks  . Drug use: Not Currently    Types: Marijuana  . Sexual activity: Yes    Birth control/protection: None  Other Topics Concern  . Not on file  Social History Narrative   Moved from [place].   Social Determinants of Health   Financial Resource Strain: Not on file  Food Insecurity: Not on file  Transportation Needs: Not on file  Physical Activity: Not on file  Stress: Not on file  Social Connections: Not on file  Intimate Partner Violence: Not on file   Family Status  Relation Name Status  . Mother  Deceased  . Father  Deceased       MVA  . Brother  Alive  . MGM  Deceased  . MGF  Deceased  . PGM  Deceased  . PGF  Deceased  . Brother  Alive  . Brother  Alive  . Mat Uncle  Deceased  . Mat Aunt  (Not Specified)   Family History  Problem Relation Age of Onset  . Heart disease Mother   . Lung cancer Mother   . Ovarian cancer Mother   . Healthy Brother   . Healthy Brother   . Healthy Brother   . Diabetes Maternal Uncle   . Breast cancer Maternal Aunt    Allergies  Allergen Reactions  . Flexeril [Cyclobenzaprine] Hives and Swelling    Facial/lip swelling     . Levofloxacin Hives and Swelling  . Phenergan [Promethazine Hcl] Other (See Comments)    Agitation.   . Toradol [Ketorolac Tromethamine] Swelling and Other (See Comments)    Facial/tongue swelling   . Tramadol Hives, Swelling and Other (See Comments)    Lip swelling   . Zoloft [Sertraline Hcl] Swelling    Tongue swelling       Patient Care Team: Paulene Floor as PCP - [facility], [facility]   Medications: Outpatient Medications Prior to Visit  Medication Sig  . albuterol (PROVENTIL HFA;VENTOLIN HFA) 108 (90 Base) MCG/ACT inhaler Inhale 2 puffs into the lungs every 6 (six) hours as needed for wheezing or shortness of breath.  Marland Kitchen albuterol  (PROVENTIL) (2.5 MG/3ML) 0.083% nebulizer solution Take 3 mLs (2.5 mg total) by nebulization every 6 (six) hours as needed for wheezing or shortness of breath. (Patient taking differently: Take 2.5 mg by nebulization 2 (two) times daily. )  . amLODipine (NORVASC) 5 MG tablet Take 1 tablet (5 mg total) by mouth daily.  Marland Kitchen atorvastatin (LIPITOR) 20 MG tablet Take 1 tablet (20 mg total) by mouth daily.  . budesonide-formoterol (SYMBICORT) 160-4.5 MCG/ACT inhaler Inhale 2 puffs into the lungs 2 (two) times daily. (Patient taking differently: Inhale 2 puffs into the lungs daily. )  . buPROPion (WELLBUTRIN SR) 150 MG 12 hr tablet TAKE 1 TABLET BY MOUTH DAILY FOR 3 DAYS, THEN 1 TABLET TWICE DAILY. STOP SMOKING 14 DAYS AFTER STARTING MEDICATION (Patient not taking: Reported on 05/10/2020)  . docusate sodium (  COLACE) 100 MG capsule Take 200 mg by mouth daily as needed for moderate constipation.   . DULoxetine (CYMBALTA) 60 MG capsule Take 1 capsule (60 mg total) by mouth daily. Please call [phone] to schedule appt or may request future refills from PCP. (Patient not taking: Reported on 03/20/2020)  . gabapentin (NEURONTIN) 600 MG tablet Take 600 mg by mouth 3 (three) times daily.  Marland Kitchen lisinopril (PRINIVIL,ZESTRIL) 40 MG tablet Take 1 tablet (40 mg total) by mouth daily.  . metaxalone (SKELAXIN) 800 MG tablet Take 800 mg by mouth 3 (three) times daily.  . metoprolol tartrate (LOPRESSOR) 25 MG tablet TAKE 1 TABLET BY MOUTH TWICE A DAY  . naloxone (NARCAN) 4 MG/0.1ML LIQD nasal spray kit Place 1 spray into the nose as needed (opioid overdose).  . Naloxone HCl (NARCAN IJ) Inject 1 Dose as directed as needed (opioid overdose).  . nitroGLYCERIN (NITROSTAT) 0.4 MG SL tablet Place 1 tablet (0.4 mg total) under the tongue every 5 (five) minutes x 3 doses as needed for chest pain.  Marland Kitchen omeprazole (PRILOSEC) 40 MG capsule TAKE 1 CAPSULE BY MOUTH TWICE A DAY  . omeprazole (PRILOSEC) 40 MG capsule Take 1 capsule (40 mg total)  by mouth 2 (two) times daily.  . ondansetron (ZOFRAN-ODT) 8 MG disintegrating tablet TAKE 1 TABLET (8 MG TOTAL) BY MOUTH 2 (TWO) TIMES DAILY.  Marland Kitchen oxyCODONE (ROXICODONE) 5 MG immediate release tablet Take 4 tablets (20 mg total) by mouth every 6 (six) hours as needed (pain).  . sucralfate (CARAFATE) 1 GM/10ML suspension TAKE 10 MLS (1 G TOTAL) BY MOUTH 4 (FOUR) TIMES DAILY.  Marland Kitchen Tiotropium Bromide Monohydrate (SPIRIVA RESPIMAT) 2.5 MCG/ACT AERS Inhale 1 puff into the lungs daily. (Patient taking differently: Inhale 1 puff into the lungs 2 (two) times daily. )   No facility-administered medications prior to visit.    Review of Systems  {Labs  Heme  Chem  Endocrine  Serology  Results Review (optional):23779::" "}  Objective    LMP 12/16/2017 (Approximate)  {Show previous vital signs (optional):23777::" "}  Physical Exam  ***  Last depression screening scores PHQ 2/9 Scores 04/03/2020 02/20/2018 08/05/2016  PHQ - 2 Score 2 1 0  PHQ- 9 Score 9 9 -   Last fall risk screening Fall Risk  03/26/2018  Falls in the past year? Yes  Number falls in past yr: 1  Comment -  Injury with Fall? No  Comment -  Risk Factor Category  -  Risk for fall due to : History of fall(s);Impaired balance/gait;Impaired mobility  Risk for fall due to: Comment -  Follow up Education provided;Falls prevention discussed   Last Audit-C alcohol use screening Alcohol Use Disorder Test (AUDIT) 04/03/2020  1. How often do you have a drink containing alcohol? 0  2. How many drinks containing alcohol do you have on a typical day when you are drinking? 0  3. How often do you have six or more drinks on one occasion? 0  AUDIT-C Score 0  Alcohol Brief Interventions/Follow-up AUDIT Score <7 follow-up not indicated   A score of 3 or more in women, and 4 or more in men indicates increased risk for alcohol abuse, EXCEPT if all of the points are from question 1   No results found for any visits on 08/23/20.  Assessment &  Plan    Routine Health Maintenance and Physical Exam  Exercise Activities and Dietary recommendations Goals    .  "I don't know why I keep falling and  don't want to fall" (pt-stated)      Patient reports at least once weekly falls that she states happen when "out of nowhere" she becomes dizzy or lightheaded. She last fell yesterday evening when she was outside with her dogs. She reports becoming dizzy and falling on the concrete steps of her home. She has related back pain today. I checked sitting/standing (orthostatic) bp's in the office today: sitting 124/80 standing 118/78. She did not experience dizziness when bp was being checked  Clinical Goals: Over the next 14 days, patient will utilize fall risk reduction strategies taught during today's visit as evidenced by patient's report of same  Interventions: fall risk reduction strategies taught to patient; update re: falls/dizziness sent to neurosurgery team, PCP, and cardiology     .  "I want to get my neck and back pain under control" (pt-stated)      I spoke with patient by phone on Friday, October 4. She had been to the neurosurgery office and notified me that her surgery is scheduled for today. She was somewhat concerned about a ride to the hospital ([facility]) but said that her brother volunteered to take her. I see in the medical record that Ms. Morrical arrived for surgery as scheduled today.   Clinical Goals: Over the nextt 7 days, patient will work with hospital and embedded cm team   Interventions: discussed neurosurgery plans with patient and offered additional support/collaboration with neurosurgery office if she has questions or needs assistance        Immunization History  Administered Date(s) Administered  . Hepatitis A, Adult 06/26/2017, 01/15/2018  . Hepatitis B, adult 06/26/2017, 07/29/2017, 12/10/2017, 06/23/2018    Health Maintenance  Topic Date Due  . COVID-19 Vaccine (1) Never done  . TETANUS/TDAP   Never done  . MAMMOGRAM  07/05/2019  . INFLUENZA VACCINE  09/21/2020 (Originally 01/23/2020)  . COLONOSCOPY (Pts 45-86yr Insurance coverage will need to be confirmed)  09/19/2021  . PAP SMEAR-Modifier  07/29/2022  . Hepatitis C Screening  Completed  . HIV Screening  Completed  . HPV VACCINES  Aged Out    Discussed health benefits of physical activity, and encouraged her to engage in regular exercise appropriate for her age and condition.  ***  No follow-ups on file.     {provider attestation***:1}   [facility] Floor [phone](phone) [phone](fax)  [place]

## 2020-08-24 DIAGNOSIS — M79622 Pain in left upper arm: Secondary | ICD-10-CM | POA: Diagnosis not present

## 2020-08-24 DIAGNOSIS — M25511 Pain in right shoulder: Secondary | ICD-10-CM | POA: Diagnosis not present

## 2020-08-24 DIAGNOSIS — M79661 Pain in right lower leg: Secondary | ICD-10-CM | POA: Diagnosis not present

## 2020-08-24 DIAGNOSIS — G894 Chronic pain syndrome: Secondary | ICD-10-CM | POA: Diagnosis not present

## 2020-08-24 DIAGNOSIS — M79662 Pain in left lower leg: Secondary | ICD-10-CM | POA: Diagnosis not present

## 2020-08-24 DIAGNOSIS — M542 Cervicalgia: Secondary | ICD-10-CM | POA: Diagnosis not present

## 2020-08-24 DIAGNOSIS — M5136 Other intervertebral disc degeneration, lumbar region: Secondary | ICD-10-CM | POA: Diagnosis not present

## 2020-08-24 DIAGNOSIS — M545 Low back pain, unspecified: Secondary | ICD-10-CM | POA: Diagnosis not present

## 2020-08-24 DIAGNOSIS — M79621 Pain in right upper arm: Secondary | ICD-10-CM | POA: Diagnosis not present

## 2020-08-24 DIAGNOSIS — M25512 Pain in left shoulder: Secondary | ICD-10-CM | POA: Diagnosis not present

## 2020-08-24 DIAGNOSIS — G890 Central pain syndrome: Secondary | ICD-10-CM | POA: Diagnosis not present

## 2020-08-30 ENCOUNTER — Telehealth: Payer: Self-pay

## 2020-08-30 ENCOUNTER — Encounter: Payer: Medicaid Other | Admitting: Physician Assistant

## 2020-08-30 NOTE — Telephone Encounter (Signed)
Copied from [place] [phone]. Topic: Appointment Scheduling - Scheduling Inquiry for Clinic >> Aug 30, 2020  2:08 PM Lennox Solders wrote: Reason for CRM: Pt had to cancel cpe with [place] today due to no transportation . Please advise

## 2020-09-01 DIAGNOSIS — I10 Essential (primary) hypertension: Secondary | ICD-10-CM | POA: Diagnosis not present

## 2020-09-01 DIAGNOSIS — M5416 Radiculopathy, lumbar region: Secondary | ICD-10-CM | POA: Diagnosis not present

## 2020-09-01 DIAGNOSIS — Z6832 Body mass index (BMI) 32.0-32.9, adult: Secondary | ICD-10-CM | POA: Diagnosis not present

## 2020-09-12 ENCOUNTER — Ambulatory Visit: Payer: Medicaid Other | Admitting: Gastroenterology

## 2020-09-12 ENCOUNTER — Encounter: Payer: Self-pay | Admitting: *Deleted

## 2020-09-21 ENCOUNTER — Other Ambulatory Visit: Payer: Self-pay | Admitting: Physician Assistant

## 2020-09-21 DIAGNOSIS — M79622 Pain in left upper arm: Secondary | ICD-10-CM | POA: Diagnosis not present

## 2020-09-21 DIAGNOSIS — M79661 Pain in right lower leg: Secondary | ICD-10-CM | POA: Diagnosis not present

## 2020-09-21 DIAGNOSIS — M25512 Pain in left shoulder: Secondary | ICD-10-CM | POA: Diagnosis not present

## 2020-09-21 DIAGNOSIS — M79662 Pain in left lower leg: Secondary | ICD-10-CM | POA: Diagnosis not present

## 2020-09-21 DIAGNOSIS — M545 Low back pain, unspecified: Secondary | ICD-10-CM | POA: Diagnosis not present

## 2020-09-21 DIAGNOSIS — M79621 Pain in right upper arm: Secondary | ICD-10-CM | POA: Diagnosis not present

## 2020-09-21 DIAGNOSIS — M25511 Pain in right shoulder: Secondary | ICD-10-CM | POA: Diagnosis not present

## 2020-09-21 DIAGNOSIS — M5136 Other intervertebral disc degeneration, lumbar region: Secondary | ICD-10-CM | POA: Diagnosis not present

## 2020-09-21 DIAGNOSIS — Z79891 Long term (current) use of opiate analgesic: Secondary | ICD-10-CM | POA: Diagnosis not present

## 2020-09-21 DIAGNOSIS — G890 Central pain syndrome: Secondary | ICD-10-CM | POA: Diagnosis not present

## 2020-09-21 DIAGNOSIS — M542 Cervicalgia: Secondary | ICD-10-CM | POA: Diagnosis not present

## 2020-09-21 DIAGNOSIS — R110 Nausea: Secondary | ICD-10-CM

## 2020-09-21 NOTE — Telephone Encounter (Signed)
Requested medication (s) are due for refill today:   Provider to review  Requested medication (s) are on the active medication list:   Yes  Future visit scheduled:   Yes   Last ordered: 08/24/2020 #20, 1 refill  Non delegated refill   Requested Prescriptions  Pending Prescriptions Disp Refills   ondansetron (ZOFRAN-ODT) 8 MG disintegrating tablet [Pharmacy Med Name: ONDANSETRON ODT 8 MG TABLET] 20 tablet 1    Sig: TAKE 1 TABLET (8 MG TOTAL) BY MOUTH 2 (TWO) TIMES DAILY.      Not Delegated - Gastroenterology: Antiemetics Failed - 09/21/2020 12:48 PM      Failed - This refill cannot be delegated      Passed - Valid encounter within last 6 months    Recent Outpatient Visits           5 months ago Encounter for tobacco use cessation counseling   [facility], [place], [place]   7 months ago Chronic nausea   [facility] [place], Wendee Beavers, [place]   1 year ago Pre-op examination   [facility] [place], Wendee Beavers, [place]   1 year ago Hypercholesterolemia   [facility] [place], Wendee Beavers, [place]   1 year ago Hospital discharge follow-up   [facility] [name], [place]       Future Appointments             In 1 week [name], [name] [employer], [facility]

## 2020-09-23 DIAGNOSIS — G894 Chronic pain syndrome: Secondary | ICD-10-CM | POA: Diagnosis not present

## 2020-09-28 DIAGNOSIS — G890 Central pain syndrome: Secondary | ICD-10-CM | POA: Diagnosis not present

## 2020-09-28 DIAGNOSIS — M25511 Pain in right shoulder: Secondary | ICD-10-CM | POA: Diagnosis not present

## 2020-09-28 DIAGNOSIS — M25512 Pain in left shoulder: Secondary | ICD-10-CM | POA: Diagnosis not present

## 2020-09-28 DIAGNOSIS — M79661 Pain in right lower leg: Secondary | ICD-10-CM | POA: Diagnosis not present

## 2020-09-28 DIAGNOSIS — M79622 Pain in left upper arm: Secondary | ICD-10-CM | POA: Diagnosis not present

## 2020-09-28 DIAGNOSIS — M79662 Pain in left lower leg: Secondary | ICD-10-CM | POA: Diagnosis not present

## 2020-09-28 DIAGNOSIS — M545 Low back pain, unspecified: Secondary | ICD-10-CM | POA: Diagnosis not present

## 2020-09-28 DIAGNOSIS — M542 Cervicalgia: Secondary | ICD-10-CM | POA: Diagnosis not present

## 2020-09-28 DIAGNOSIS — M5136 Other intervertebral disc degeneration, lumbar region: Secondary | ICD-10-CM | POA: Diagnosis not present

## 2020-09-28 DIAGNOSIS — M79621 Pain in right upper arm: Secondary | ICD-10-CM | POA: Diagnosis not present

## 2020-09-28 DIAGNOSIS — G894 Chronic pain syndrome: Secondary | ICD-10-CM | POA: Diagnosis not present

## 2020-10-03 ENCOUNTER — Ambulatory Visit: Payer: Self-pay | Admitting: Physician Assistant

## 2020-10-04 DIAGNOSIS — I10 Essential (primary) hypertension: Secondary | ICD-10-CM | POA: Diagnosis not present

## 2020-10-04 DIAGNOSIS — Z6833 Body mass index (BMI) 33.0-33.9, adult: Secondary | ICD-10-CM | POA: Diagnosis not present

## 2020-10-04 DIAGNOSIS — M5416 Radiculopathy, lumbar region: Secondary | ICD-10-CM | POA: Diagnosis not present

## 2020-10-05 ENCOUNTER — Other Ambulatory Visit: Payer: Self-pay | Admitting: Neurological Surgery

## 2020-10-05 DIAGNOSIS — M5416 Radiculopathy, lumbar region: Secondary | ICD-10-CM

## 2020-10-10 ENCOUNTER — Ambulatory Visit: Payer: Self-pay | Admitting: Family Medicine

## 2020-10-12 DIAGNOSIS — M542 Cervicalgia: Secondary | ICD-10-CM | POA: Diagnosis not present

## 2020-10-12 DIAGNOSIS — M25511 Pain in right shoulder: Secondary | ICD-10-CM | POA: Diagnosis not present

## 2020-10-12 DIAGNOSIS — M25512 Pain in left shoulder: Secondary | ICD-10-CM | POA: Diagnosis not present

## 2020-10-12 DIAGNOSIS — G890 Central pain syndrome: Secondary | ICD-10-CM | POA: Diagnosis not present

## 2020-10-12 DIAGNOSIS — M79622 Pain in left upper arm: Secondary | ICD-10-CM | POA: Diagnosis not present

## 2020-10-12 DIAGNOSIS — M545 Low back pain, unspecified: Secondary | ICD-10-CM | POA: Diagnosis not present

## 2020-10-12 DIAGNOSIS — G8929 Other chronic pain: Secondary | ICD-10-CM | POA: Diagnosis not present

## 2020-10-12 DIAGNOSIS — M79661 Pain in right lower leg: Secondary | ICD-10-CM | POA: Diagnosis not present

## 2020-10-12 DIAGNOSIS — M5136 Other intervertebral disc degeneration, lumbar region: Secondary | ICD-10-CM | POA: Diagnosis not present

## 2020-10-12 DIAGNOSIS — M79621 Pain in right upper arm: Secondary | ICD-10-CM | POA: Diagnosis not present

## 2020-10-12 DIAGNOSIS — Z79891 Long term (current) use of opiate analgesic: Secondary | ICD-10-CM | POA: Diagnosis not present

## 2020-10-12 DIAGNOSIS — G894 Chronic pain syndrome: Secondary | ICD-10-CM | POA: Diagnosis not present

## 2020-10-12 DIAGNOSIS — M79662 Pain in left lower leg: Secondary | ICD-10-CM | POA: Diagnosis not present

## 2020-10-16 NOTE — Progress Notes (Deleted)
Established patient visit   Patient: Sara Munoz   DOB: 1966-01-08   55 y.o. Female  MRN: [id number] Visit Date: 10/17/2020  Today's healthcare provider: Laurita Quint Just, FNP   No chief complaint on file.  Subjective    HPI  Hypertension, follow-up  BP Readings from Last 3 Encounters:  05/24/20 128/70  05/16/20 (!) 166/95  04/03/20 (!) 172/88   Wt Readings from Last 3 Encounters:  05/23/20 185 lb (83.9 kg)  05/16/20 190 lb 1 oz (86.2 kg)  04/03/20 185 lb 12.8 oz (84.3 kg)     She was last seen for hypertension 6 months ago.  BP at that visit was 172/88. Management since that visit includes increase Amlodipine to 10m daily.  She reports {excellent/good/fair/poor:19665} compliance with treatment. She {is/is not:9024} having side effects. {document side effects if present:1} She is following a {diet:21022986} diet. She {is/is not:9024} exercising. She {does/does not:200015} smoke.  Use of agents associated with hypertension: {bp agents assoc with hypertension:511::"none"}.   Outside blood pressures are {***enter patient reported home BP readings, or 'not being checked':1}. Symptoms: {Yes/No:20286} chest pain {Yes/No:20286} chest pressure  {Yes/No:20286} palpitations {Yes/No:20286} syncope  {Yes/No:20286} dyspnea {Yes/No:20286} orthopnea  {Yes/No:20286} paroxysmal nocturnal dyspnea {Yes/No:20286} lower extremity edema   Pertinent labs: Lab Results  Component Value Date   CHOL 251 (H) 02/20/2018   HDL 46 02/20/2018   LDLCALC 161 (H) 02/20/2018   TRIG 220 (H) 02/20/2018   CHOLHDL 5.5 (H) 02/20/2018   Lab Results  Component Value Date   NA 138 05/16/2020   K 4.1 05/16/2020   CREATININE 0.75 05/16/2020   GFRNONAA >60 05/16/2020   GFRAA >60 11/17/2019   GLUCOSE 96 05/16/2020     The 10-year ASCVD risk score (Mikey BussingDC Jr., et al., 2013) is: 11.2%   --------------------------------------------------------------------------------------------------- Follow up  for smoking  The patient was last seen for this 6 months ago. Changes made at last visit include start Wellbutrin.  She reports {excellent/good/fair/poor:19665} compliance with treatment. She feels that condition is {improved/worse/unchanged:3041574}. She {is/is not:21021397} having side effects. ***  -----------------------------------------------------------------------------------------  Patient Active Problem List   Diagnosis Date Noted  . Lumbar radiculopathy 05/23/2020  . Sepsis ([place] 10/19/2018  . Severe sepsis ([place] 10/19/2018  . Cervical radiculopathy 02/12/2018  . Chronic migraine 02/12/2018  . Paresthesia 08/29/2017  . Neck pain 08/29/2017  . Daily headache 08/29/2017  . Chronic active hepatitis ([place] 07/29/2017  . Neurogenic pain 07/31/2016  . Chronic pain syndrome 06/05/2016  . Carotid artery stenosis 02/27/2016  . Chronic constipation 02/27/2016  . Chronic nausea 02/27/2016  . Hypercholesterolemia 02/27/2016  . Osteoarthritis 02/27/2016  . PTSD (post-traumatic stress disorder) 02/27/2016  . TIA (transient ischemic attack) 02/27/2016  . Long term current use of opiate analgesic 02/27/2016  . Long term prescription opiate use 02/27/2016  . Encounter for pain management consult 02/27/2016  . Chronic hip pain (Location of Tertiary source of pain) (Bilateral) (L>R) 02/27/2016  . Chronic knee pain (Bilateral) (L>R) 02/27/2016  . Chronic shoulder pain (Bilateral) (L>R) 02/27/2016  . Chronic sacroiliac joint pain (Bilateral) (L>R) 02/27/2016  . Chronic low back pain (Location of Primary Source of Pain) (Bilateral) (L>R) 02/27/2016  . Chronic lower extremity pain (Location of Secondary source of pain) (Bilateral) (L>R) 02/27/2016  . Osteoarthritis of hip (Bilateral) (L>R) 02/27/2016  . Chronic neck pain (posterior midline) (Bilateral) (L>R) 02/27/2016  . Cervicogenic headache (Bilateral) (L>R) 02/27/2016  . Occipital headache (Bilateral) (L>R) 02/27/2016  . Chronic  upper extremity pain (Bilateral) (L>R)  02/27/2016  . Chronic Cervical radicular pain (Bilateral) (L>R) 02/27/2016  . Chronic lumbar radicular pain (Right) (L5 dermatome) 02/27/2016  . Lumbar facet syndrome (Bilateral) (L>R) 02/27/2016  . Long term prescription benzodiazepine use 02/27/2016  . Hypertension   . Chronic obstructive pulmonary disease ([place]) 10/09/2015  . GERD (gastroesophageal reflux disease) 10/09/2015  . Non-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/2014 08/02/2015  . Hyperlipidemia 08/02/2015  . Costochondritis 08/02/2015  . 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 GAD (generalized anxiety disorder) 09/03/2014  . Narcotic dependence ([place]) 09/03/2014  . PVD (peripheral vascular disease) ([place]) 09/03/2014  . Smoker 09/03/2014  . Cigarette nicotine dependence with nicotine-induced disorder 07/08/2014  . Anemia of chronic disease 03/19/2014  . Narcotic abuse, continuous ([place]) 03/19/2014  . Polysubstance abuse ([employer]) 03/19/2014  . MSSA (methicillin susceptible Staphylococcus aureus) septicemia ([place]) 03/07/2014  . Hypomagnesemia 01/04/2014  . Chronic cough 09/04/2011  . Sleep apnea, obstructive 09/04/2011   Social History   Tobacco Use  . Smoking status: Current Every Day Smoker    Packs/day: 1.00    Years: 35.00    Pack years: 35.00    Types: Cigarettes  . Smokeless tobacco: Never Used  . Tobacco comment: 07/2019- 1.5 a day  Vaping Use  . Vaping Use: Former  Substance Use Topics  . Alcohol use: Not Currently    Alcohol/week: 0.0 standard drinks  . Drug use: Not Currently    Types: Marijuana   Allergies  Allergen Reactions  . Flexeril [Cyclobenzaprine] Hives and Swelling    Facial/lip swelling     . Levofloxacin Hives and Swelling  . Phenergan [Promethazine Hcl] Other (See Comments)    Agitation.   . Toradol [Ketorolac Tromethamine] Swelling and Other (See Comments)    Facial/tongue swelling   . Tramadol Hives, Swelling and Other (See Comments)    Lip swelling   . Zoloft  [Sertraline Hcl] Swelling    Tongue swelling          Medications: Outpatient Medications Prior to Visit  Medication Sig  . albuterol (PROVENTIL HFA;VENTOLIN HFA) 108 (90 Base) MCG/ACT inhaler Inhale 2 puffs into the lungs every 6 (six) hours as needed for wheezing or shortness of breath.  Marland Kitchen albuterol (PROVENTIL) (2.5 MG/3ML) 0.083% nebulizer solution Take 3 mLs (2.5 mg total) by nebulization every 6 (six) hours as needed for wheezing or shortness of breath. (Patient taking differently: Take 2.5 mg by nebulization 2 (two) times daily. )  . amLODipine (NORVASC) 5 MG tablet Take 1 tablet (5 mg total) by mouth daily.  Marland Kitchen atorvastatin (LIPITOR) 20 MG tablet Take 1 tablet (20 mg total) by mouth daily.  . budesonide-formoterol (SYMBICORT) 160-4.5 MCG/ACT inhaler Inhale 2 puffs into the lungs 2 (two) times daily. (Patient taking differently: Inhale 2 puffs into the lungs daily. )  . buPROPion (WELLBUTRIN SR) 150 MG 12 hr tablet TAKE 1 TABLET BY MOUTH DAILY FOR 3 DAYS, THEN 1 TABLET TWICE DAILY. STOP SMOKING 14 DAYS AFTER STARTING MEDICATION (Patient not taking: Reported on 05/10/2020)  . docusate sodium (COLACE) 100 MG capsule Take 200 mg by mouth daily as needed for moderate constipation.   . DULoxetine (CYMBALTA) 60 MG capsule Take 1 capsule (60 mg total) by mouth daily. Please call [phone] to schedule appt or may request future refills from PCP. (Patient not taking: Reported on 03/20/2020)  . gabapentin (NEURONTIN) 600 MG tablet Take 600 mg by mouth 3 (three) times daily.  Marland Kitchen lisinopril (PRINIVIL,ZESTRIL) 40 MG tablet Take 1 tablet (40 mg total) by mouth  daily.  . metaxalone (SKELAXIN) 800 MG tablet Take 800 mg by mouth 3 (three) times daily.  . metoprolol tartrate (LOPRESSOR) 25 MG tablet TAKE 1 TABLET BY MOUTH TWICE A DAY  . naloxone (NARCAN) 4 MG/0.1ML LIQD nasal spray kit Place 1 spray into the nose as needed (opioid overdose).  . Naloxone HCl (NARCAN IJ) Inject 1 Dose as directed as needed  (opioid overdose).  . nitroGLYCERIN (NITROSTAT) 0.4 MG SL tablet Place 1 tablet (0.4 mg total) under the tongue every 5 (five) minutes x 3 doses as needed for chest pain.  Marland Kitchen omeprazole (PRILOSEC) 40 MG capsule TAKE 1 CAPSULE BY MOUTH TWICE A DAY  . omeprazole (PRILOSEC) 40 MG capsule Take 1 capsule (40 mg total) by mouth 2 (two) times daily.  . ondansetron (ZOFRAN-ODT) 8 MG disintegrating tablet TAKE 1 TABLET (8 MG TOTAL) BY MOUTH 2 (TWO) TIMES DAILY.  Marland Kitchen oxyCODONE (ROXICODONE) 5 MG immediate release tablet Take 4 tablets (20 mg total) by mouth every 6 (six) hours as needed (pain).  . sucralfate (CARAFATE) 1 GM/10ML suspension TAKE 10 MLS (1 G TOTAL) BY MOUTH 4 (FOUR) TIMES DAILY.  Marland Kitchen Tiotropium Bromide Monohydrate (SPIRIVA RESPIMAT) 2.5 MCG/ACT AERS Inhale 1 puff into the lungs daily. (Patient taking differently: Inhale 1 puff into the lungs 2 (two) times daily. )   No facility-administered medications prior to visit.    Review of Systems  {Labs  Heme  Chem  Endocrine  Serology  Results Review (optional):23779::" "}    Objective    LMP 12/16/2017 (Approximate)  {Show previous vital signs (optional):23777::" "}  Physical Exam  ***  No results found for any visits on 10/17/20.  Assessment & Plan     ***  No follow-ups on file.      {provider attestation***:1}   Laurita Quint Just, [place] [phone] (phone) [phone] (fax)  [place]

## 2020-10-17 ENCOUNTER — Ambulatory Visit: Payer: Self-pay | Admitting: Family Medicine

## 2020-10-19 ENCOUNTER — Ambulatory Visit: Payer: Self-pay | Admitting: Family Medicine

## 2020-10-19 ENCOUNTER — Other Ambulatory Visit: Payer: Self-pay

## 2020-10-20 DIAGNOSIS — F4542 Pain disorder with related psychological factors: Secondary | ICD-10-CM | POA: Diagnosis not present

## 2020-10-23 DIAGNOSIS — G894 Chronic pain syndrome: Secondary | ICD-10-CM | POA: Diagnosis not present

## 2020-11-01 ENCOUNTER — Ambulatory Visit: Admission: RE | Admit: 2020-11-01 | Payer: Medicaid Other | Source: Ambulatory Visit

## 2020-11-08 ENCOUNTER — Other Ambulatory Visit: Payer: Self-pay | Admitting: Family Medicine

## 2020-11-08 ENCOUNTER — Other Ambulatory Visit: Payer: Self-pay | Admitting: Gastroenterology

## 2020-11-08 DIAGNOSIS — R110 Nausea: Secondary | ICD-10-CM

## 2020-11-08 NOTE — Telephone Encounter (Signed)
Requested medication (s) are due for refill today: Yes  Requested medication (s) are on the active medication list: Yes  Last refill:  09/25/20  Future visit scheduled: No  Notes to clinic:  See request.    Requested Prescriptions  Pending Prescriptions Disp Refills   ondansetron (ZOFRAN-ODT) 8 MG disintegrating tablet [Pharmacy Med Name: ONDANSETRON ODT 8 MG TABLET] 20 tablet 1    Sig: TAKE 1 TABLET (8 MG TOTAL) BY MOUTH 2 (TWO) TIMES DAILY.      Not Delegated - Gastroenterology: Antiemetics Failed - 11/08/2020  3:05 PM      Failed - This refill cannot be delegated      Failed - Valid encounter within last 6 months    Recent Outpatient Visits           7 months ago Encounter for tobacco use cessation counseling   [place], [place]   9 months ago Chronic nausea   [facility] [place], [place], [place]   1 year ago Pre-op examination   [facility] [name], [place]   1 year ago Hypercholesterolemia   [facility] [name], [place]   2 years ago [facility] discharge follow-up   [place], [place], [place]

## 2020-11-09 DIAGNOSIS — M79622 Pain in left upper arm: Secondary | ICD-10-CM | POA: Diagnosis not present

## 2020-11-09 DIAGNOSIS — M545 Low back pain, unspecified: Secondary | ICD-10-CM | POA: Diagnosis not present

## 2020-11-09 DIAGNOSIS — M25511 Pain in right shoulder: Secondary | ICD-10-CM | POA: Diagnosis not present

## 2020-11-09 DIAGNOSIS — M79661 Pain in right lower leg: Secondary | ICD-10-CM | POA: Diagnosis not present

## 2020-11-09 DIAGNOSIS — M5136 Other intervertebral disc degeneration, lumbar region: Secondary | ICD-10-CM | POA: Diagnosis not present

## 2020-11-09 DIAGNOSIS — M25512 Pain in left shoulder: Secondary | ICD-10-CM | POA: Diagnosis not present

## 2020-11-09 DIAGNOSIS — M542 Cervicalgia: Secondary | ICD-10-CM | POA: Diagnosis not present

## 2020-11-09 DIAGNOSIS — G890 Central pain syndrome: Secondary | ICD-10-CM | POA: Diagnosis not present

## 2020-11-09 DIAGNOSIS — M79621 Pain in right upper arm: Secondary | ICD-10-CM | POA: Diagnosis not present

## 2020-11-09 DIAGNOSIS — M79662 Pain in left lower leg: Secondary | ICD-10-CM | POA: Diagnosis not present

## 2020-11-09 DIAGNOSIS — G8929 Other chronic pain: Secondary | ICD-10-CM | POA: Diagnosis not present

## 2020-11-13 ENCOUNTER — Other Ambulatory Visit: Payer: Self-pay

## 2020-11-13 ENCOUNTER — Ambulatory Visit
Admission: RE | Admit: 2020-11-13 | Discharge: 2020-11-13 | Disposition: A | Discharge status: Home / Self Care | Payer: Medicaid Other | Source: Ambulatory Visit | Attending: Neurological Surgery | Admitting: Neurological Surgery

## 2020-11-13 DIAGNOSIS — M545 Low back pain, unspecified: Secondary | ICD-10-CM | POA: Diagnosis not present

## 2020-11-13 DIAGNOSIS — M5416 Radiculopathy, lumbar region: Secondary | ICD-10-CM | POA: Insufficient documentation

## 2020-11-14 ENCOUNTER — Telehealth: Payer: Self-pay

## 2020-11-14 DIAGNOSIS — G43909 Migraine, unspecified, not intractable, without status migrainosus: Secondary | ICD-10-CM

## 2020-11-14 NOTE — Telephone Encounter (Signed)
Copied from [place] [id number]. Topic: General - Inquiry >> Nov 14, 2020  9:20 AM Greggory Keen D wrote: Reason for CRM: Pt called saying she needs a new referral to [facility].  She called for an appt and they told her the original had expired. For migrianes and possible MS.  CB#  [phone]

## 2020-11-14 NOTE — Telephone Encounter (Signed)
Ok to reorder

## 2020-11-14 NOTE — Telephone Encounter (Signed)
Referral has been ordered.    Thanks,   -Mickel Baas

## 2020-11-16 ENCOUNTER — Encounter: Payer: Self-pay | Admitting: Gastroenterology

## 2020-11-16 ENCOUNTER — Other Ambulatory Visit: Payer: Self-pay

## 2020-11-16 ENCOUNTER — Ambulatory Visit (INDEPENDENT_AMBULATORY_CARE_PROVIDER_SITE_OTHER): Payer: Medicaid Other | Admitting: Gastroenterology

## 2020-11-16 VITALS — BP 134/84 | HR 68 | Ht 64.0 in | Wt 192.8 lb

## 2020-11-16 DIAGNOSIS — T402X5A Adverse effect of other opioids, initial encounter: Secondary | ICD-10-CM | POA: Diagnosis not present

## 2020-11-16 DIAGNOSIS — R109 Unspecified abdominal pain: Secondary | ICD-10-CM | POA: Diagnosis not present

## 2020-11-16 DIAGNOSIS — F17200 Nicotine dependence, unspecified, uncomplicated: Secondary | ICD-10-CM | POA: Diagnosis not present

## 2020-11-16 DIAGNOSIS — Z791 Long term (current) use of non-steroidal anti-inflammatories (NSAID): Secondary | ICD-10-CM

## 2020-11-16 DIAGNOSIS — R110 Nausea: Secondary | ICD-10-CM

## 2020-11-16 DIAGNOSIS — K5903 Drug induced constipation: Secondary | ICD-10-CM

## 2020-11-16 MED ORDER — ONDANSETRON HCL 4 MG PO TABS: 4.0000 mg | ORAL_TABLET | Freq: Three times a day (TID) | ORAL | 1 refills | Status: DC | PRN

## 2020-11-16 NOTE — Patient Instructions (Signed)
Samples of Linzess 290 mcg were given in office today. (5)

## 2020-11-16 NOTE — Progress Notes (Signed)
[name] MD, MRCP(U.K) [address]  [place]  [place], [place] [address]  Main: [phone]  Fax: [phone]   Primary Care Physician: Paulene Floor  Primary Gastroenterologist:  Dr. [name]   Chief Complaint  Patient presents with  . office visit    Digestion issues, bloating    HPI: Sara Munoz is a 55 y.o. female    Summary of history : Sara Munoz initially referred back in 05/2016 for hepatitis CGT 3Completed treatment in 3/2019and attained SVR.[id number] 4 constipation, hepatic steatosis, gastritis.  Upper GI bleed secondary to [facility] powder use.   CT abdomen 03/17/17- increased stool burden and hepatic steatosis. Colonoscopy 08/2016 - small adenomas excised  RUQ USG 05/07/16 showed hepatic steatosis.  09/18/17 : EGD: normal appearance but bx showed mild active gastritis and negative for H pylori . 02/25/18: H pylori breath test negative 10/19/2018: CT scan of the abdomenPerinephric fat stranding was seen 07/27/2019: Seen at the office complaint of melena was taking BC powders for headaches for years.  08/10/2019 EGD: Localized inflammation seen in the antrum suggestive of gastritis biopsies taken that demonstrated reactive gastropathy.  Negative for H. pylori.   Interval history 08/25/2019-11/16/2020  Today she is here to see me back for abdominal discomfort which is very similar to what she has had in the past lasts all day worse when she eats feels bloated bowel movements range from normal to hard.  Sometimes does not have a bowel movement for days.  Dose of oxycodone has been increased.  She has resumed taking her BC powder for her back pain.  Continues to smoke.  No change in bowel movements otherwise from her baseline constipation.  No rectal bleeding.  Complains of nausea.   We will have epigastric discomfort.  Not really related to meals.  Feels bloated.  Continues to smoke.  Stopped all BC powders.  Taking her PPI twice a day.   Complains of nausea.     Current Outpatient Medications  Medication Sig Dispense Refill  . albuterol (PROVENTIL HFA;VENTOLIN HFA) 108 (90 Base) MCG/ACT inhaler Inhale 2 puffs into the lungs every 6 (six) hours as needed for wheezing or shortness of breath. 1 Inhaler 2  . albuterol (PROVENTIL) (2.5 MG/3ML) 0.083% nebulizer solution Take 3 mLs (2.5 mg total) by nebulization every 6 (six) hours as needed for wheezing or shortness of breath. (Patient taking differently: Take 2.5 mg by nebulization 2 (two) times daily.) 75 mL 12  . amLODipine (NORVASC) 5 MG tablet Take 1 tablet (5 mg total) by mouth daily. 90 tablet 1  . atorvastatin (LIPITOR) 20 MG tablet Take 1 tablet (20 mg total) by mouth daily. 90 tablet 3  . budesonide-formoterol (SYMBICORT) 160-4.5 MCG/ACT inhaler Inhale 2 puffs into the lungs 2 (two) times daily. (Patient taking differently: Inhale 2 puffs into the lungs daily.) 1 Inhaler 0  . docusate sodium (COLACE) 100 MG capsule Take 200 mg by mouth daily as needed for moderate constipation.     . gabapentin (NEURONTIN) 600 MG tablet Take 600 mg by mouth 3 (three) times daily.    Marland Kitchen lisinopril (PRINIVIL,ZESTRIL) 40 MG tablet Take 1 tablet (40 mg total) by mouth daily. 90 tablet 1  . metaxalone (SKELAXIN) 800 MG tablet Take 800 mg by mouth 3 (three) times daily.    . metoprolol tartrate (LOPRESSOR) 25 MG tablet TAKE 1 TABLET BY MOUTH TWICE A DAY 180 tablet 1  . naloxone (NARCAN) 4 MG/0.1ML LIQD nasal spray kit Place  1 spray into the nose as needed (opioid overdose).    . Naloxone HCl (NARCAN IJ) Inject 1 Dose as directed as needed (opioid overdose).    . nitroGLYCERIN (NITROSTAT) 0.4 MG SL tablet Place 1 tablet (0.4 mg total) under the tongue every 5 (five) minutes x 3 doses as needed for chest pain. 30 tablet 2  . omeprazole (PRILOSEC) 40 MG capsule TAKE 1 CAPSULE BY MOUTH TWICE A DAY 90 capsule 3  . omeprazole (PRILOSEC) 40 MG capsule Take 1 capsule (40 mg total) by mouth 2 (two) times  daily. 90 capsule 3  . ondansetron (ZOFRAN-ODT) 8 MG disintegrating tablet TAKE 1 TABLET (8 MG TOTAL) BY MOUTH 2 (TWO) TIMES DAILY. 20 tablet 1  . oxyCODONE (ROXICODONE) 5 MG immediate release tablet Take 4 tablets (20 mg total) by mouth every 6 (six) hours as needed (pain). 30 tablet 0  . sucralfate (CARAFATE) 1 GM/10ML suspension TAKE 10 MLS (1 G TOTAL) BY MOUTH 4 (FOUR) TIMES DAILY. 840 mL 4  . Tiotropium Bromide Monohydrate (SPIRIVA RESPIMAT) 2.5 MCG/ACT AERS Inhale 1 puff into the lungs daily. (Patient taking differently: Inhale 1 puff into the lungs 2 (two) times daily.) 4 g 3  . buPROPion (WELLBUTRIN SR) 150 MG 12 hr tablet TAKE 1 TABLET BY MOUTH DAILY FOR 3 DAYS, THEN 1 TABLET TWICE DAILY. STOP SMOKING 14 DAYS AFTER STARTING MEDICATION (Patient not taking: No sig reported) 180 tablet 0  . DULoxetine (CYMBALTA) 60 MG capsule Take 1 capsule (60 mg total) by mouth daily. Please call [phone] to schedule appt or may request future refills from PCP. (Patient not taking: No sig reported) 30 capsule 2   No current facility-administered medications for this visit.    Allergies as of 11/16/2020 - Review Complete 11/16/2020  Allergen Reaction Noted  . Flexeril [cyclobenzaprine] Hives and Swelling 11/13/2013  . Levofloxacin Hives and Swelling 02/27/2012  . Phenergan [promethazine hcl] Other (See Comments) 02/21/2017  . Toradol [ketorolac tromethamine] Swelling and Other (See Comments) 01/17/2015  . Tramadol Hives, Swelling, and Other (See Comments) 01/17/2015  . Zoloft [sertraline hcl] Swelling 05/31/2015    ROS:  General: Negative for anorexia, weight loss, fever, chills, fatigue, weakness. ENT: Negative for hoarseness, difficulty swallowing , nasal congestion. CV: Negative for chest pain, angina, palpitations, dyspnea on exertion, peripheral edema.  Respiratory: Negative for dyspnea at rest, dyspnea on exertion, cough, sputum, wheezing.  GI: See history of present illness. GU:  Negative  for dysuria, hematuria, urinary incontinence, urinary frequency, nocturnal urination.  Endo: Negative for unusual weight change.    Physical Examination:   BP 134/84 (BP Location: Left Arm, Patient Position: Sitting, Cuff Size: Large)   Pulse 68   Ht '5\' 4"'  (1.626 m)   Wt 192 lb 12.8 oz (87.5 kg)   Sara Munoz (Approximate)   BMI 33.09 kg/m   General: Well-nourished, well-developed in no acute distress.  Eyes: No icterus. Conjunctivae pink. Mouth: Oropharyngeal mucosa moist and pink , no lesions erythema or exudate. Lungs: Clear to auscultation bilaterally. Non-labored. Heart: Regular rate and rhythm, no murmurs rubs or gallops.  Abdomen: Bowel sounds are normal, nontender, nondistended, no hepatosplenomegaly or masses, no abdominal bruits or hernia , no rebound or guarding.   Extremities: No lower extremity edema. No clubbing or deformities. Neuro: Alert and oriented x 3.  Grossly intact. Skin: Warm and dry, no jaundice.   Psych: Alert and cooperative, normal mood and affect.   Imaging Studies: MR LUMBAR SPINE WO CONTRAST  Result Date: 11/14/2020 CLINICAL  DATA:  Low back pain.  Prior surgery. EXAM: MRI LUMBAR SPINE WITHOUT CONTRAST TECHNIQUE: Multiplanar, multisequence MR imaging of the lumbar spine was performed. No intravenous contrast was administered. COMPARISON:  MRI 01/31/2020. FINDINGS: Segmentation: Standard segmentation is assumed. The inferior-most fully formed intervertebral disc is labeled L5-S1. This is consistent with the prior MRI numbering. Alignment: Slight retrolisthesis of L3 on L4 and L5 on S1, similar to prior. Vertebrae: Interval L5-S1 posterior fusion with interbody spacer no specific evidence of acute fracture or discitis/osteomyelitis. Vertebral body heights are maintained. Metallic artifact limits evaluation at the levels of fusion. Conus medullaris and cauda equina: Conus extends to the L2 level. Conus appears normal. Paraspinal and other soft tissues:  Unremarkable. Disc levels: T11-T12: Partially visible disc bulging without significant canal stenosis, similar to prior. T12-L1: No significant disc protrusion, foraminal stenosis, or canal stenosis. L1-L2: No significant disc protrusion, foraminal stenosis, or canal stenosis. L2-L3: No significant disc protrusion, foraminal stenosis, or canal stenosis. L3-L4: Mild disc bulging and facet hypertrophy without significant canal stenosis. Similar borderline to mild bilateral foraminal stenosis. L4-L5: Disc desiccation and broad based disc bulge, similar to prior. Mild bilateral facet hypertrophy. Mildly progressed mild right greater than left subarticular recess stenosis at this level without significant canal stenosis. Similar mild to moderate bilateral foraminal stenosis. L5-S1: Interval posterior fusion. No significant canal stenosis. Subarticular recess stenosis is improved and mild on the right. Foraminal stenosis is improved. While evaluation of the foramina is limited by metallic artifact, the foramina appear patent on axial imaging. IMPRESSION: 1. Mild to moderate bilateral foraminal stenosis at L4-L5, similar to prior. Mildly progressed mild right greater than left subarticular recess stenosis at this level without significant canal stenosis. 2. Interval L5-S1 posterior fusion with improved foraminal stenosis. No significant canal stenosis. Electronically Signed   By: Margaretha Sheffield MD   On: 11/14/2020 11:03    Assessment and Plan:   HARMONEE TOZER is a 55 y.o. y/o female with a prior history of constipation, GI bleed secondary to NSAID use, hepatic steatosis here to see me for chronic abdominal pain which is very likely secondary to inflammation of the GI tract due to chronic NSAID use i.e. BC powder, constipation secondary to oxycodone use and aerophagia secondary to smoking.     Plan  1. Continue Prilosec 40 mg twice a day. 2.  Stop all NSAID use and continue to do so. 3.  Zofran as needed as  needed for nausea.  Provided 30 tablets 4. Smoker: Advised to strongly stop smoking as it can contribute to aerophagia and nausea. 5.  Informed her that as long as she is on NSAIDs, continues to smoke and does not take any medications for her constipation symptoms are not going to get better.  I have given her a 1 week supply of Linzess 290 mcg to help with her constipation.  I have instructed her clearly that if it does work to call my office to get a prescription for 90 days.   Dr [name]  MD,MRCP [facility]) Follow up in as needed

## 2020-12-07 DIAGNOSIS — G8929 Other chronic pain: Secondary | ICD-10-CM | POA: Diagnosis not present

## 2020-12-08 ENCOUNTER — Other Ambulatory Visit: Payer: Self-pay

## 2020-12-08 ENCOUNTER — Encounter: Payer: Self-pay | Admitting: Emergency Medicine

## 2020-12-08 ENCOUNTER — Emergency Department
Admission: EM | Admit: 2020-12-08 | Discharge: 2020-12-08 | Disposition: A | Discharge status: Home / Self Care | Payer: Medicaid Other | Attending: Emergency Medicine | Admitting: Emergency Medicine

## 2020-12-08 DIAGNOSIS — I10 Essential (primary) hypertension: Secondary | ICD-10-CM | POA: Insufficient documentation

## 2020-12-08 DIAGNOSIS — J45909 Unspecified asthma, uncomplicated: Secondary | ICD-10-CM | POA: Diagnosis not present

## 2020-12-08 DIAGNOSIS — M5440 Lumbago with sciatica, unspecified side: Secondary | ICD-10-CM | POA: Diagnosis not present

## 2020-12-08 DIAGNOSIS — M5441 Lumbago with sciatica, right side: Secondary | ICD-10-CM | POA: Diagnosis not present

## 2020-12-08 DIAGNOSIS — Z7951 Long term (current) use of inhaled steroids: Secondary | ICD-10-CM | POA: Insufficient documentation

## 2020-12-08 DIAGNOSIS — F17210 Nicotine dependence, cigarettes, uncomplicated: Secondary | ICD-10-CM | POA: Insufficient documentation

## 2020-12-08 DIAGNOSIS — G8929 Other chronic pain: Secondary | ICD-10-CM | POA: Insufficient documentation

## 2020-12-08 DIAGNOSIS — Z79899 Other long term (current) drug therapy: Secondary | ICD-10-CM | POA: Insufficient documentation

## 2020-12-08 DIAGNOSIS — I2510 Atherosclerotic heart disease of native coronary artery without angina pectoris: Secondary | ICD-10-CM | POA: Diagnosis not present

## 2020-12-08 DIAGNOSIS — M549 Dorsalgia, unspecified: Secondary | ICD-10-CM | POA: Diagnosis present

## 2020-12-08 MED ORDER — METHOCARBAMOL 750 MG PO TABS: 750.0000 mg | ORAL_TABLET | Freq: Four times a day (QID) | ORAL | 0 refills | Status: DC | PRN

## 2020-12-08 MED ORDER — HYDROXYZINE HCL 50 MG PO TABS: 50.0000 mg | ORAL_TABLET | Freq: Three times a day (TID) | ORAL | 0 refills | Status: DC | PRN

## 2020-12-08 MED ORDER — METHYLPREDNISOLONE 4 MG PO TBPK: ORAL_TABLET | ORAL | 0 refills | Status: DC

## 2020-12-08 MED ORDER — METHYLPREDNISOLONE SODIUM SUCC 125 MG IJ SOLR
125.0000 mg | Freq: Once | INTRAMUSCULAR | Status: AC
  Administered 2020-12-08: 09:00:00 125 mg via INTRAMUSCULAR
  Filled 2020-12-08: qty 2

## 2020-12-08 MED ORDER — ORPHENADRINE CITRATE 30 MG/ML IJ SOLN
60.0000 mg | Freq: Two times a day (BID) | INTRAMUSCULAR | Status: DC
  Administered 2020-12-08: 09:00:00 60 mg via INTRAMUSCULAR
  Filled 2020-12-08: qty 2

## 2020-12-08 MED ORDER — HYDROMORPHONE HCL 1 MG/ML IJ SOLN
1.0000 mg | Freq: Once | INTRAMUSCULAR | Status: AC
  Administered 2020-12-08: 09:00:00 1 mg via INTRAMUSCULAR
  Filled 2020-12-08: qty 1

## 2020-12-08 MED ORDER — HYDROXYZINE HCL 50 MG PO TABS
50.0000 mg | ORAL_TABLET | Freq: Once | ORAL | Status: AC
  Administered 2020-12-08: 09:00:00 50 mg via ORAL
  Filled 2020-12-08: qty 1

## 2020-12-08 NOTE — ED Provider Notes (Signed)
[facility] Emergency Department Provider Note   ____________________________________________   Event Date/Time   First MD Initiated Contact with Patient 12/08/20 [phone]     (approximate)  I have reviewed the triage vital signs and the nursing notes.   HISTORY  Chief Complaint Back Pain   HPI Sara Munoz is a 55 y.o. female patient presents with back pain for 1 week.  Patient has surgery to her back several months ago and is followed by the surgeon and pain management.  Patient said pain manages prescribes her 1 month supply of oxycodone to 20 mg 3 times a day.  Patient states has been out of pain medication for 2 weeks unable to see her pain doctor for 2 more weeks secondary to a scheduling conflict.  Patient states she is scheduled to see her surgeon next week.  Patient is unable to sleep secondary to pain.  Patient stated she is developing a radicular component to her back pain in the past 2 days.  Patient denies bladder or bowel dysfunction.  Patient rates her pain as a 10/10.  Describes the pain as achy".         Past Medical History:  Diagnosis Date   Anxiety    Asthma    Atypical chest pain 08/02/2015   CAD (coronary artery disease)    Chronic back pain    COPD (chronic obstructive pulmonary disease) (HCC)    Coronary artery disease    a. Mild-non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/2014   GERD (gastroesophageal reflux disease)    Hepatitis C    per patient, "took medicines for it and no longer has it"   Hypercholesteremia    Hypertension    Hypokalemia    Hypomagnesemia 01/04/2014   Liver disease    MI (myocardial infarction) ([place])    Nerve pain    per patient, in the lower back   Opiate use 02/27/2016   Osteoarthritis    Stroke [facility])     Patient Active Problem List   Diagnosis Date Noted   Lumbar radiculopathy 05/23/2020   Sepsis ([place]) 10/19/2018   Severe sepsis ([place]) 10/19/2018   Cervical radiculopathy 02/12/2018   Chronic migraine  02/12/2018   Paresthesia 08/29/2017   Neck pain 08/29/2017   Daily headache 08/29/2017   Chronic active hepatitis ([place]) 07/29/2017   Neurogenic pain 07/31/2016   Chronic pain syndrome 06/05/2016   Carotid artery stenosis 02/27/2016   Chronic constipation 02/27/2016   Chronic nausea 02/27/2016   Hypercholesterolemia 02/27/2016   Osteoarthritis 02/27/2016   PTSD (post-traumatic stress disorder) 02/27/2016   TIA (transient ischemic attack) 02/27/2016   Long term current use of opiate analgesic 02/27/2016   Long term prescription opiate use 02/27/2016   Encounter for pain management consult 02/27/2016   Chronic hip pain (Location of Tertiary source of pain) (Bilateral) (L>R) 02/27/2016   Chronic knee pain (Bilateral) (L>R) 02/27/2016   Chronic shoulder pain (Bilateral) (L>R) 02/27/2016   Chronic sacroiliac joint pain (Bilateral) (L>R) 02/27/2016   Chronic low back pain (Location of Primary Source of Pain) (Bilateral) (L>R) 02/27/2016   Chronic lower extremity pain (Location of Secondary source of pain) (Bilateral) (L>R) 02/27/2016   Osteoarthritis of hip (Bilateral) (L>R) 02/27/2016   Chronic neck pain (posterior midline) (Bilateral) (L>R) 02/27/2016   Cervicogenic headache (Bilateral) (L>R) 02/27/2016   Occipital headache (Bilateral) (L>R) 02/27/2016   Chronic upper extremity pain (Bilateral) (L>R) 02/27/2016   Chronic Cervical radicular pain (Bilateral) (L>R) 02/27/2016   Chronic lumbar radicular pain (  Right) (L5 dermatome) 02/27/2016   Lumbar facet syndrome (Bilateral) (L>R) 02/27/2016   Long term prescription benzodiazepine use 02/27/2016   Hypertension    Chronic obstructive pulmonary disease ([place]) 10/09/2015   GERD (gastroesophageal reflux disease) 10/09/2015   Non-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/2014 08/02/2015   Hyperlipidemia 08/02/2015   Costochondritis 08/02/2015   Drug abuse, 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[place]) 09/03/2014   GAD (generalized anxiety disorder) 09/03/2014   Narcotic dependence ([place])  09/03/2014   PVD (peripheral vascular disease) ([place]) 09/03/2014   Smoker 09/03/2014   Cigarette nicotine dependence with nicotine-induced disorder 07/08/2014   Anemia of chronic disease 03/19/2014   Narcotic abuse, continuous ([place]) 03/19/2014   Polysubstance abuse ([place]) 03/19/2014   MSSA (methicillin susceptible Staphylococcus aureus) septicemia ([place]) 03/07/2014   Hypomagnesemia 01/04/2014   Chronic cough 09/04/2011   Sleep apnea, obstructive 09/04/2011    Past Surgical History:  Procedure Laterality Date   ANTERIOR CERVICAL DECOMP/DISCECTOMY FUSION N/A 03/30/2018   Procedure: Cervical six-seven Anterior cervical decompression/discectomy/fusion;  Surgeon: Judith Part, MD;  Location: [place];  Service: Neurosurgery;  Laterality: N/A;   ANTERIOR CERVICAL DECOMP/DISCECTOMY FUSION N/A 01/11/2019   Procedure: Cervical Five-Six Anterior cervical discectomy fusion,  Cervical Five to Cervical Seven anterior instrumented fusion;  Surgeon: Judith Part, MD;  Location: [place];  Service: Neurosurgery;  Laterality: N/A;  Cervical Five-Six Anterior cervical discectomy fusion,  Cervical Five to Cervical Seven anterior instrumented fusion   BACK SURGERY     CARDIAC CATHETERIZATION Left 02/22/2016   Procedure: Left Heart Cath and Coronary Angiography;  Surgeon: Yolonda Kida, MD;  Location: [place] [facility];  Service: Cardiovascular;  Laterality: Left;   CATARACT EXTRACTION W/ INTRAOCULAR LENS IMPLANT Right    COLONOSCOPY WITH PROPOFOL N/A 09/19/2016   Procedure: COLONOSCOPY WITH PROPOFOL;  Surgeon: [name], MD;  Location: [facility] ENDOSCOPY;  Service: Endoscopy;  Laterality: N/A;   DIAGNOSTIC LAPAROSCOPY     ESOPHAGOGASTRODUODENOSCOPY (EGD) WITH PROPOFOL N/A 09/18/2017   Procedure: ESOPHAGOGASTRODUODENOSCOPY (EGD) WITH PROPOFOL;  Surgeon: [name], MD;  Location: [facility] ENDOSCOPY;  Service: Gastroenterology;  Laterality: N/A;   ESOPHAGOGASTRODUODENOSCOPY (EGD) WITH PROPOFOL N/A 08/10/2019    Procedure: ESOPHAGOGASTRODUODENOSCOPY (EGD) WITH PROPOFOL;  Surgeon: [name], MD;  Location: [facility] ENDOSCOPY;  Service: Endoscopy;  Laterality: N/A;   FRACTURE SURGERY     hemorroids     NASAL SINUS SURGERY     ORIF FEMUR FRACTURE     ORIF TIBIA & FIBULA FRACTURES     OVARY SURGERY     TRANSFORAMINAL LUMBAR INTERBODY FUSION W/ MIS 1 LEVEL Right 05/23/2020   Procedure: Right Lumbar Five Sacral One Minimally invasive transforaminal lumbar interbody fusion;  Surgeon: Judith Part, MD;  Location: [place];  Service: Neurosurgery;  Laterality: Right;  Right Lumbar Five Sacral One Minimally invasive transforaminal lumbar interbody fusion    Prior to Admission medications   Medication Sig Start Date End Date Taking? Authorizing Provider  hydrOXYzine (ATARAX/VISTARIL) 50 MG tablet Take 1 tablet (50 mg total) by mouth 3 (three) times daily as needed. 12/08/20  Yes Sable Feil, [name]  methocarbamol (ROBAXIN) 750 MG tablet Take 1 tablet (750 mg total) by mouth every 6 (six) hours as needed for muscle spasms. 12/08/20  Yes Sable Feil, [name]  methylPREDNISolone (MEDROL DOSEPAK) 4 MG TBPK tablet Take Tapered dose as directed 12/08/20  Yes Sable Feil, [name]  albuterol (PROVENTIL HFA;VENTOLIN HFA) 108 (90 Base) MCG/ACT inhaler Inhale 2 puffs into the lungs every 6 (six) hours as needed for wheezing  or shortness of breath. 05/26/17   [name], [name]  albuterol (PROVENTIL) (2.5 MG/3ML) 0.083% nebulizer solution Take 3 mLs (2.5 mg total) by nebulization every 6 (six) hours as needed for wheezing or shortness of breath. Patient taking differently: Take 2.5 mg by nebulization 2 (two) times daily. 08/05/19   [name], MD  amLODipine (NORVASC) 5 MG tablet Take 1 tablet (5 mg total) by mouth daily. 02/11/20   [name], [name]  atorvastatin (LIPITOR) 20 MG tablet Take 1 tablet (20 mg total) by mouth daily. 07/09/18   [name], [name]  budesonide-formoterol (SYMBICORT)  160-4.5 MCG/ACT inhaler Inhale 2 puffs into the lungs 2 (two) times daily. Patient taking differently: Inhale 2 puffs into the lungs daily. 05/26/17   [name], [name]  buPROPion (WELLBUTRIN SR) 150 MG 12 hr tablet TAKE 1 TABLET BY MOUTH DAILY FOR 3 DAYS, THEN 1 TABLET TWICE DAILY. STOP SMOKING 14 DAYS AFTER STARTING MEDICATION Patient not taking: No sig reported 05/04/20   [name], [name]  docusate sodium (COLACE) 100 MG capsule Take 200 mg by mouth daily as needed for moderate constipation.     [provider]  DULoxetine (CYMBALTA) 60 MG capsule Take 1 capsule (60 mg total) by mouth daily. Please call [phone] to schedule appt or may request future refills from PCP. Patient not taking: No sig reported 03/18/19   [name], MD  gabapentin (NEURONTIN) 600 MG tablet Take 600 mg by mouth 3 (three) times daily.    [provider]  lisinopril (PRINIVIL,ZESTRIL) 40 MG tablet Take 1 tablet (40 mg total) by mouth daily. 08/05/17   [name], [name]  metaxalone (SKELAXIN) 800 MG tablet Take 800 mg by mouth 3 (three) times daily.    [provider]  metoprolol tartrate (LOPRESSOR) 25 MG tablet TAKE 1 TABLET BY MOUTH TWICE A DAY 08/16/20   Carles Collet M, [name]  naloxone [facility]) 4 MG/0.1ML LIQD nasal spray kit Place 1 spray into the nose as needed (opioid overdose).    [provider]  Naloxone HCl (NARCAN IJ) Inject 1 Dose as directed as needed (opioid overdose).    [provider]  nitroGLYCERIN (NITROSTAT) 0.4 MG SL tablet Place 1 tablet (0.4 mg total) under the tongue every 5 (five) minutes x 3 doses as needed for chest pain. 08/03/15   Demetrios Loll, MD  omeprazole (PRILOSEC) 40 MG capsule TAKE 1 CAPSULE BY MOUTH TWICE A DAY 07/04/20   [name], MD  omeprazole (PRILOSEC) 40 MG capsule Take 1 capsule (40 mg total) by mouth 2 (two) times daily. 07/04/20   [name], MD  ondansetron (ZOFRAN) 4 MG tablet Take 1 tablet (4 mg total) by mouth every  8 (eight) hours as needed for nausea or vomiting. 11/16/20   [name], MD  ondansetron (ZOFRAN-ODT) 8 MG disintegrating tablet TAKE 1 TABLET (8 MG TOTAL) BY MOUTH 2 (TWO) TIMES DAILY. 11/13/20   Birdie Sons, MD  oxyCODONE (ROXICODONE) 5 MG immediate release tablet Take 4 tablets (20 mg total) by mouth every 6 (six) hours as needed (pain). 05/24/20   Judith Part, MD  sucralfate (CARAFATE) 1 GM/10ML suspension TAKE 10 MLS (1 G TOTAL) BY MOUTH 4 (FOUR) TIMES DAILY. 05/22/20   [name], MD  Tiotropium Bromide Monohydrate (SPIRIVA RESPIMAT) 2.5 MCG/ACT AERS Inhale 1 puff into the lungs daily. Patient taking differently: Inhale 1 puff into the lungs 2 (two) times daily. 03/18/19   [name], [name]  Allergies Flexeril [cyclobenzaprine], Levofloxacin, Phenergan [promethazine hcl], Toradol [ketorolac tromethamine], Tramadol, and Zoloft [sertraline hcl]  Family History  Problem Relation Age of Onset   Heart disease Mother    Lung cancer Mother    Ovarian cancer Mother    Healthy Brother    Healthy Brother    Healthy Brother    Diabetes Maternal Uncle    Breast cancer Maternal Aunt     Social History Social History   Tobacco Use   Smoking status: Every Day    Packs/day: 1.00    Years: 35.00    Pack years: 35.00    Types: Cigarettes   Smokeless tobacco: Never   Tobacco comments:    07/2019- 1.5 a day  Vaping Use   Vaping Use: Former  Substance Use Topics   Alcohol use: Not Currently    Alcohol/week: 0.0 standard drinks   Drug use: Not Currently    Types: Marijuana    Review of Systems  Constitutional: No fever/chills Eyes: No visual changes. ENT: No sore throat. Cardiovascular: Denies chest pain. Respiratory: Denies shortness of breath. Gastrointestinal: No abdominal pain.  No nausea, no vomiting.  No diarrhea.  No constipation. Genitourinary: Negative for dysuria. Musculoskeletal: Chronic back pain.   Skin: Negative for rash. Neurological: Negative  for headaches, focal weakness or numbness. Psychiatric: Anxiety. Endocrine: Hepatitis C, hyperlipidemia, hypertension, hypokalemia, and hypomagnesia. Allergic/Immunilogical: Flexeril, Levaquin, Phenergan, Toradol, tramadol, and Zoloft.  ____________________________________________   PHYSICAL EXAM:  VITAL SIGNS: ED Triage Vitals  Enc Vitals Group     BP 12/08/20 0839 (!) 181/110     Pulse Rate 12/08/20 0839 88     Resp 12/08/20 0839 16     Temp 12/08/20 0842 97.7 F (36.5 C)     Temp Source 12/08/20 0842 Oral     SpO2 12/08/20 0839 100 %     Weight 12/08/20 0839 192 lb 14.4 oz (87.5 kg)     Height 12/08/20 0839 _0  (1.626 m)     Head Circumference --      Peak Flow --      Pain Score 12/08/20 0838 9     Pain Loc --      Pain Edu? --      Excl. in [place]? --     Constitutional: Alert and oriented. Well appearing and in no acute distress. Cardiovascular: Normal rate, regular rhythm. Grossly normal heart sounds.  Good peripheral circulation.  Elevated blood pressure.  Patient not take medication prior to arrival. Respiratory: Normal respiratory effort.  No retractions. Lungs CTAB. Gastrointestinal: Soft and nontender. No distention. No abdominal bruits. No CVA tenderness. Genitourinary: Deferred Musculoskeletal: No lower extremity tenderness nor edema.  No joint effusions. Neurologic:  Normal speech and language. No gross focal neurologic deficits are appreciated. No gait instability. Skin:  Skin is warm, dry and intact. No rash noted. Psychiatric: Mood and affect are normal. Speech and behavior are normal.  ____________________________________________   LABS (all labs ordered are listed, but only abnormal results are displayed)  Labs Reviewed - No data to display ____________________________________________  EKG   ____________________________________________  RADIOLOGY I, Sable Feil, personally viewed and evaluated these images (plain radiographs) as part of my  medical decision making, as well as reviewing the written report by the radiologist.  ED MD interpretation:    Official radiology report(s): No results found.  ____________________________________________   PROCEDURES  Procedure(s) performed (including Critical Care):  Procedures   ____________________________________________   INITIAL IMPRESSION / ASSESSMENT AND PLAN / ED  COURSE  As part of my medical decision making, I reviewed the following data within the [place]         Patient presents with chronic back pain.  Patient states secondary to scheduling conflicts she is out of her chronic pain medications.  Discussed rationale for not refilling pain medications as she is on acute pain program.  Patient given prescription for Medrol Dosepak, Norflex, and Atarax.  Patient was given Dilaudid IM, Solu-Medrol, Atarax prior to departure.  Patient advised to follow-up with scheduled appointments.      ____________________________________________   FINAL CLINICAL IMPRESSION(S) / ED DIAGNOSES  Final diagnoses:  Chronic low back pain with sciatica, sciatica laterality unspecified, unspecified back pain laterality     ED Discharge Orders          Ordered    methocarbamol (ROBAXIN) 750 MG tablet  Every 6 hours PRN        12/08/20 0904    methylPREDNISolone (MEDROL DOSEPAK) 4 MG TBPK tablet        12/08/20 0904    hydrOXYzine (ATARAX/VISTARIL) 50 MG tablet  3 times daily PRN        12/08/20 2417             Note:  This document was prepared using Dragon voice recognition software and may include unintentional dictation errors.    Sable Feil, [name] 12/08/20 5301    Lavonia Drafts, MD 12/08/20 [phone]

## 2020-12-08 NOTE — ED Notes (Signed)
See triage note  Presents with lower back pain which is moving into both legs  States a hx of sciatica   Has been out of pain meds for 2 days  Denies recent injury  Ambulates well

## 2020-12-08 NOTE — Discharge Instructions (Addendum)
Follow-up with schedule surgical and pain management doctor.

## 2020-12-08 NOTE — ED Triage Notes (Signed)
C/O back pain x 1 week. States had recent surgery 'a few months back' and takes oxycodone 20 mg tid for pain.  Has been out of pain meds x 2 days.  Unable to see the pain doctor for two week.  AAOx3.  Skin warm and dry. NAD  Ambulates independently, steady gait.

## 2020-12-11 ENCOUNTER — Telehealth: Payer: Self-pay

## 2020-12-11 NOTE — Telephone Encounter (Signed)
Transition Care Management Follow-up Telephone Call Date of discharge and from where: 12/08/2020 from [facility] How have you been since you were released from the hospital? Pt stated that she is in pain and that her pain Any questions or concerns? No  Items Reviewed: Did the pt receive and understand the discharge instructions provided? Yes  Medications obtained and verified? Yes  Other? No  Any new allergies since your discharge? No  Dietary orders reviewed? No Do you have support at home? Yes   Functional Questionnaire: (I = Independent and D = Dependent) ADLs: I  Bathing/Dressing- I  Meal Prep- I  Eating- I  Maintaining continence- I  Transferring/Ambulation- I  Managing Meds- I   Follow up appointments reviewed:  PCP Hospital f/u appt confirmed? No   Specialist Hospital f/u appt confirmed? Yes  Pt has an appointment with a counselor and then she will make an appointment with her pain management provider.  Are transportation arrangements needed? No  If their condition worsens, is the pt aware to call PCP or go to the Emergency Dept.? Yes Was the patient provided with contact information for the PCP's office or ED? Yes Was to pt encouraged to call back with questions or concerns? Yes

## 2020-12-20 ENCOUNTER — Other Ambulatory Visit: Payer: Self-pay | Admitting: Obstetrics and Gynecology

## 2020-12-20 ENCOUNTER — Other Ambulatory Visit: Payer: Self-pay

## 2020-12-20 NOTE — Patient Instructions (Addendum)
Hi Sara Munoz, thank you for speaking with me today.  Sara Munoz was given information about Medicaid Managed Care team care coordination services as a part of their [place] Complete Medicaid benefit. Sara Munoz verbally consented to engagement with the [facility] Managed Care team.   For questions related to your [facility] Complete Medicaid health plan, please call: [phone]  If you would like to schedule transportation through your [place] Complete Medicaid plan, please call the following number at least 2 days in advance of your appointment: [phone].   Call the [place] at [phone], at any time, 24 hours a day, 7 days a week. If you are in danger or need immediate medical attention call 911.  Sara Munoz - following are the goals we discussed in your visit today:   Goals Addressed             This Visit's Progress    Protect My Health       Timeframe:  Long-Range Goal Priority:  High Start Date:       12/20/20                      Expected End Date:    ongoing                   Follow Up Date 01/19/21    - schedule appointment for flu shot - schedule appointment for vaccines needed due to my age or health - schedule recommended health tests (blood work, mammogram, colonoscopy, pap test) - schedule and keep appointment for annual check-up    Why is this important?   Screening tests can find diseases early when they are easier to treat.  Your doctor or nurse will talk with you about which tests are important for you.  Getting shots for common diseases like the flu and shingles will help prevent them.       Patient verbalizes understanding of instructions provided today.   The Managed Medicaid care management team will reach out to the patient again over the next 30 days.  The patient has been provided with contact information for the Managed Medicaid care management team and has been advised to call with any health related questions or  concerns.   [name] RN, BSN [place] Management Coordinator - Managed Medicaid High Risk [phone]    Following is a copy of your plan of care:  Patient Care Plan: General Plan of Care (Adult)     Problem Identified: Health Promotion or Disease Self-Management (General Plan of Care)   Priority: High  Onset Date: 12/20/2020     Long-Range Goal: Self-Management Plan Developed   Start Date: 12/20/2020  Expected End Date: 03/22/2021  This Visit's Progress: Not on track  Priority: High  Note:     Current Barriers:  Knowledge Deficits related to short term plan for care coordination needs and long term plans for chronic disease management needs.  Patient with many chronic health conditions-CAD, HTN, PVD, TIA, migraines, COPD, OSA, GERD, hepatitis, tobacco use, osteoarthritis, chronic pain, anxiety. Nurse Case Manager Clinical Goal(s):  patient will work with care management team to address care coordination and chronic disease management needs related to Disease Management Educational Needs Care Coordination Medication Management and Education Medication Reconciliation Medication Assistance  Psychosocial Support Mental Health Counseling   Interventions:  Evaluation of current treatment plan  and patient's adherence to plan as established by provider. Advised patient to contact  PCP regarding blood pressure. Reviewed medications with patient. Collaborated with pharmacy regarding medications.  Collaborated with SW regarding anxiety. Discussed plans with patient for ongoing care management follow up and provided patient with direct contact information for care management team Advised patient, providing education and rationale, to monitor blood pressure daily and record, calling PCPfor findings outside established parameters.  Reviewed scheduled/upcoming provider appointments. Social Work referral for anxiety, PTSD. Pharmacy referral for  medication review. Patient given 1-800-QUITNOW for smoking cessation if interested. Self Care Activities:  Patient will self administer medications as prescribed Patient will attend all scheduled provider appointments Patient will call pharmacy for medication refills Patient will continue to perform ADL's independently Patient will call provider office for new concerns or questions Patient Goals: In the next 30 days, patient will meet with SW to discuss anxiety. In the next 30 days, patient will meet with Pharmacist to review medications, In the next 90 days, patient will attend all scheduled appointments. - Follow Up Plan: The patient has been provided with contact information for the care management team and has been advised to call with any health related questions or concerns.  The care management team will reach out to the patient again over the next 30 days.  Evidence-based guidance:   Review biopsychosocial determinants of health screens.  Review need for preventive screening based on age, sex, family history and health history.  Determine level of modifiable health risk.   Discuss identified risks.  Identify areas where behavior change may lead to improved health.  Promote healthy lifestyle.  Evoke change talk using open-ended questions, pros and cons, as well as looking forward.  Identify and manage conditions or preconditions to reduce health risk.  Implement additional goals and interventions based on identified risk factors.

## 2020-12-20 NOTE — Patient Outreach (Signed)
Medicaid Managed Care   Nurse Care Manager Note  12/20/2020 Name:  Sara Munoz MRN:  [id number] DOB:  Mar 01, 1966  Sara Munoz is an 55 y.o. year old female who is a primary patient of Sara Munoz, [place].  The [facility] Managed Care Coordination team was consulted for assistance with:    Chronic healthcare management needs.  Sara Munoz was given information about Medicaid Managed Care Coordination team services today. Sara Munoz agreed to services and verbal consent obtained.  Engaged with patient by telephone for initial visit in response to provider referral for case management and/or care coordination services.   Assessments/Interventions:  Review of past medical history, allergies, medications, health status, including review of consultants reports, laboratory and other test data, was performed as part of comprehensive evaluation and provision of chronic care management services.  SDOH (Social Determinants of Health) assessments and interventions performed:   Care Plan  Allergies  Allergen Reactions   Flexeril [Cyclobenzaprine] Hives and Swelling    Facial/lip swelling      Levofloxacin Hives and Swelling   Phenergan [Promethazine Hcl] Other (See Comments)    Agitation.    Toradol [Ketorolac Tromethamine] Swelling and Other (See Comments)    Facial/tongue swelling    Tramadol Hives, Swelling and Other (See Comments)    Lip swelling    Zoloft [Sertraline Hcl] Swelling    Tongue swelling       Medications Reviewed Today     Reviewed by Sara Medicus, RN (Registered Nurse) on 12/20/20 at 1514  Med List Status: <None>   Medication Order Taking? Sig Documenting Provider Last Dose Status Informant  albuterol (PROVENTIL HFA;VENTOLIN HFA) 108 (90 Base) MCG/ACT inhaler [id number] Yes Inhale 2 puffs into the lungs every 6 (six) hours as needed for wheezing or shortness of breath. Sara Post, [name] Taking Active Self           Med Note Sara Munoz  Mar 20, 2020  3:20 PM)    albuterol (PROVENTIL) (2.5 MG/3ML) 0.083% nebulizer solution [id number] Yes Take 3 mLs (2.5 mg total) by nebulization every 6 (six) hours as needed for wheezing or shortness of breath.  Patient taking differently: Take 2.5 mg by nebulization 2 (two) times daily.   [name], MD Taking Active   amLODipine (NORVASC) 5 MG tablet [id number] Yes Take 1 tablet (5 mg total) by mouth daily. Sara Post, [name] Taking Active Self  atorvastatin (LIPITOR) 20 MG tablet [id number] Yes Take 1 tablet (20 mg total) by mouth daily. Sara Post, [name] Taking Active Self  budesonide-formoterol [facility]) 160-4.5 MCG/ACT inhaler [id number] No Inhale 2 puffs into the lungs 2 (two) times daily.  Patient not taking: Reported on 12/20/2020   Sara Post, [name] Not Taking Active            Med Note [name], [place] A   Wed May 10, 2020  1:01 PM) Pt confirmed she does spiriva bid and symbicort qd  buPROPion (WELLBUTRIN SR) 150 MG 12 hr tablet [id number] No TAKE 1 TABLET BY MOUTH DAILY FOR 3 DAYS, THEN 1 TABLET TWICE DAILY. STOP SMOKING 14 DAYS AFTER STARTING MEDICATION  Patient not taking: No sig reported   Pollak, Adriana M, [name] Not Taking Active   docusate sodium (COLACE) 100 MG capsule [id number] Yes Take 200 mg by mouth daily as needed for moderate constipation.  [provider] Taking Active Self  DULoxetine (CYMBALTA) 60 MG capsule [id number] No Take 1 capsule (  60 mg total) by mouth daily. Please call [phone] to schedule appt or may request future refills from PCP.  Patient not taking: No sig reported   [name], MD Not Taking Active   gabapentin (NEURONTIN) 600 MG tablet [id number] Yes Take 600 mg by mouth 3 (three) times daily. [provider] Taking Active Self  hydrOXYzine (ATARAX/VISTARIL) 50 MG tablet [id number] Yes Take 1 tablet (50 mg total) by mouth 3 (three) times daily as needed. Sable Feil, [name] Taking Active   lisinopril (PRINIVIL,ZESTRIL)  40 MG tablet [id number] Yes Take 1 tablet (40 mg total) by mouth daily. Sara Post, [name] Taking Active Self  metaxalone (SKELAXIN) 800 MG tablet [id number] No Take 800 mg by mouth 3 (three) times daily.  Patient not taking: Reported on 12/20/2020   [provider] Not Taking Active Self  methocarbamol (ROBAXIN) 750 MG tablet [id number] No Take 1 tablet (750 mg total) by mouth every 6 (six) hours as needed for muscle spasms.  Patient not taking: Reported on 12/20/2020   Sable Feil, [name] Not Taking Active   methylPREDNISolone (MEDROL DOSEPAK) 4 MG TBPK tablet [id number] No Take Tapered dose as directed  Patient not taking: Reported on 12/20/2020   Sable Feil, [name] Not Taking Active   metoprolol tartrate (LOPRESSOR) 25 MG tablet [id number] Yes TAKE 1 TABLET BY MOUTH TWICE A DAY Pollak, Adriana M, [name] Taking Active   naloxone [facility]) 4 MG/0.1ML LIQD nasal spray kit [id number]  Place 1 spray into the nose as needed (opioid overdose). [provider]  Active Self           Med Note [name]   Wed May 10, 2020 12:59 PM) Has both the nasal spray and injection  Naloxone HCl (NARCAN IJ) [id number]  Inject 1 Dose as directed as needed (opioid overdose). [provider]  Active Self           Med Note [name]   Wed May 10, 2020 12:59 PM) Has both the nasal spray and injection  nitroGLYCERIN (NITROSTAT) 0.4 MG SL tablet [id number]  Place 1 tablet (0.4 mg total) under the tongue every 5 (five) minutes x 3 doses as needed for chest pain. Demetrios Loll, MD  Active Self  omeprazole (PRILOSEC) 40 MG capsule [id number] Yes TAKE 1 CAPSULE BY MOUTH TWICE A DAY [name], MD Taking Active   omeprazole (PRILOSEC) 40 MG capsule [id number]  Take 1 capsule (40 mg total) by mouth 2 (two) times daily. [name], MD  Active   ondansetron [facility]) 4 MG tablet [id number] No Take 1 tablet (4 mg total) by mouth every 8 (eight) hours as needed for nausea or vomiting.  Patient not  taking: Reported on 12/20/2020   [name], MD Not Taking Active   ondansetron (ZOFRAN-ODT) 8 MG disintegrating tablet [id number] Yes TAKE 1 TABLET (8 MG TOTAL) BY MOUTH 2 (TWO) TIMES DAILY. Birdie Sons, MD Taking Active   oxyCODONE (ROXICODONE) 5 MG immediate release tablet [id number] No Take 4 tablets (20 mg total) by mouth every 6 (six) hours as needed (pain).  Patient not taking: Reported on 12/20/2020   Judith Part, MD Not Taking Active   sucralfate (CARAFATE) 1 GM/10ML suspension [id number] Yes TAKE 10 MLS (1 G TOTAL) BY MOUTH 4 (FOUR) TIMES DAILY. [name], MD Taking Active   Tiotropium Bromide Monohydrate (SPIRIVA RESPIMAT) 2.5 MCG/ACT AERS [id number] Yes Inhale 1 puff into the lungs daily.  Patient taking differently: Inhale 1 puff  into the lungs 2 (two) times daily.   Sara Post, [name] Taking Active            Med Note [name]   Wed May 10, 2020  1:01 PM) Pt confirmed she does spiriva bid and symbicort qd            Patient Active Problem List   Diagnosis Date Noted   Lumbar radiculopathy 05/23/2020   Sepsis ([place]) 10/19/2018   Severe sepsis ([place]) 10/19/2018   Cervical radiculopathy 02/12/2018   Chronic migraine 02/12/2018   Paresthesia 08/29/2017   Neck pain 08/29/2017   Daily headache 08/29/2017   Chronic active hepatitis ([place]) 07/29/2017   Neurogenic pain 07/31/2016   Chronic pain syndrome 06/05/2016   Carotid artery stenosis 02/27/2016   Chronic constipation 02/27/2016   Chronic nausea 02/27/2016   Hypercholesterolemia 02/27/2016   Osteoarthritis 02/27/2016   PTSD (Munoz-traumatic stress disorder) 02/27/2016   TIA (transient ischemic attack) 02/27/2016   Long term current use of opiate analgesic 02/27/2016   Long term prescription opiate use 02/27/2016   Encounter for pain management consult 02/27/2016   Chronic hip pain (Location of Tertiary source of pain) (Bilateral) (L>R) 02/27/2016   Chronic knee pain (Bilateral) (L>R) 02/27/2016    Chronic shoulder pain (Bilateral) (L>R) 02/27/2016   Chronic sacroiliac joint pain (Bilateral) (L>R) 02/27/2016   Chronic low back pain (Location of Primary Source of Pain) (Bilateral) (L>R) 02/27/2016   Chronic lower extremity pain (Location of Secondary source of pain) (Bilateral) (L>R) 02/27/2016   Osteoarthritis of hip (Bilateral) (L>R) 02/27/2016   Chronic neck pain (posterior midline) (Bilateral) (L>R) 02/27/2016   Cervicogenic headache (Bilateral) (L>R) 02/27/2016   Occipital headache (Bilateral) (L>R) 02/27/2016   Chronic upper extremity pain (Bilateral) (L>R) 02/27/2016   Chronic Cervical radicular pain (Bilateral) (L>R) 02/27/2016   Chronic lumbar radicular pain (Right) (L5 dermatome) 02/27/2016   Lumbar facet syndrome (Bilateral) (L>R) 02/27/2016   Long term prescription benzodiazepine use 02/27/2016   Hypertension    Chronic obstructive pulmonary disease ([place]) 10/09/2015   GERD (gastroesophageal reflux disease) 10/09/2015   Non-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/2014 08/02/2015   Hyperlipidemia 08/02/2015   Costochondritis 08/02/2015   Drug abuse, 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   GAD (generalized anxiety disorder) 09/03/2014   Narcotic dependence ([place]) 09/03/2014   PVD (peripheral vascular disease) ([place]) 09/03/2014   Smoker 09/03/2014   Cigarette nicotine dependence with nicotine-induced disorder 07/08/2014   Anemia of chronic disease 03/19/2014   Narcotic abuse, continuous ([place]) 03/19/2014   Polysubstance abuse ([place]) 03/19/2014   MSSA (methicillin susceptible Staphylococcus aureus) septicemia ([place]) 03/07/2014   Hypomagnesemia 01/04/2014   Chronic cough 09/04/2011   Sleep apnea, obstructive 09/04/2011    Conditions to be addressed/monitored per PCP order:   chronic healthcare management needs, CAD, HTN, PVD, TIA, migraines, COPD, OSA, GERD, hepatitis, tobacco use, osteoathitis, chonic pain, anxiety.  Care Plan : General Plan of Care (Adult)  Updates made by Sara Medicus, RN  since 12/20/2020 12:00 AM     Problem: Health Promotion or Disease Self-Management (General Plan of Care)   Priority: High  Onset Date: 12/20/2020     Long-Range Goal: Self-Management Plan Developed   Start Date: 12/20/2020  Expected End Date: 03/22/2021  This Visit's Progress: Not on track  Priority: High  Note:     Current Barriers:  Knowledge Deficits related to short term plan for care coordination needs and long term plans for chronic disease management needs.  Patient with many chronic health conditions-CAD, HTN,  PVD, TIA, migraines, COPD, OSA, GERD, hepatitis, tobacco use, osteoarthritis, chronic pain, anxiety. Nurse Case Manager Clinical Goal(s):  patient will work with care management team to address care coordination and chronic disease management needs related to Disease Management Educational Needs Care Coordination Medication Management and Education Medication Reconciliation Medication Assistance  Psychosocial Support Mental Health Counseling   Interventions:  Evaluation of current treatment plan  and patient's adherence to plan as established by provider. Advised patient to contact PCP regarding blood pressure. Reviewed medications with patient. Collaborated with pharmacy regarding medications.  Collaborated with SW regarding anxiety. Discussed plans with patient for ongoing care management follow up and provided patient with direct contact information for care management team Advised patient, providing education and rationale, to monitor blood pressure daily and record, calling PCPfor findings outside established parameters.  Reviewed scheduled/upcoming provider appointments. Social Work referral for anxiety, PTSD. Pharmacy referral for medication review. Patient given 1-800-QUITNOW for smoking cessation if interested. Self Care Activities:  Patient will self administer medications as prescribed Patient will attend all scheduled provider appointments Patient will  call pharmacy for medication refills Patient will continue to perform ADL's independently Patient will call provider office for new concerns or questions Patient Goals: - Follow Up Plan: The patient has been provided with contact information for the care management team and has been advised to call with any health related questions or concerns.  The care management team will reach out to the patient again over the next 30 days.  Evidence-based guidance:   Review biopsychosocial determinants of health screens.  Review need for preventive screening based on age, sex, family history and health history.  Determine level of modifiable health risk.   Discuss identified risks.  Identify areas where behavior change may lead to improved health.  Promote healthy lifestyle.  Evoke change talk using open-ended questions, pros and cons, as well as looking forward.  Identify and manage conditions or preconditions to reduce health risk.  Implement additional goals and interventions based on identified risk factors.     Follow Up:  Patient agrees to Care Plan and Follow-up.  Plan: The Managed Medicaid care management team will reach out to the patient again over the next 30 days. and The patient has been provided with contact information for the Managed Medicaid care management team and has been advised to call with any health related questions or concerns.  Date/time of next scheduled RN care management/care coordination outreach:  01/22/21 at 330.

## 2020-12-22 DIAGNOSIS — M5136 Other intervertebral disc degeneration, lumbar region: Secondary | ICD-10-CM | POA: Diagnosis not present

## 2020-12-22 DIAGNOSIS — M25511 Pain in right shoulder: Secondary | ICD-10-CM | POA: Diagnosis not present

## 2020-12-22 DIAGNOSIS — M545 Low back pain, unspecified: Secondary | ICD-10-CM | POA: Diagnosis not present

## 2020-12-22 DIAGNOSIS — M79621 Pain in right upper arm: Secondary | ICD-10-CM | POA: Diagnosis not present

## 2020-12-22 DIAGNOSIS — M25512 Pain in left shoulder: Secondary | ICD-10-CM | POA: Diagnosis not present

## 2020-12-22 DIAGNOSIS — M542 Cervicalgia: Secondary | ICD-10-CM | POA: Diagnosis not present

## 2020-12-22 DIAGNOSIS — M79661 Pain in right lower leg: Secondary | ICD-10-CM | POA: Diagnosis not present

## 2020-12-22 DIAGNOSIS — M79662 Pain in left lower leg: Secondary | ICD-10-CM | POA: Diagnosis not present

## 2020-12-22 DIAGNOSIS — G8929 Other chronic pain: Secondary | ICD-10-CM | POA: Diagnosis not present

## 2020-12-22 DIAGNOSIS — M79622 Pain in left upper arm: Secondary | ICD-10-CM | POA: Diagnosis not present

## 2020-12-22 DIAGNOSIS — G894 Chronic pain syndrome: Secondary | ICD-10-CM | POA: Diagnosis not present

## 2020-12-27 ENCOUNTER — Telehealth: Payer: Self-pay | Admitting: Physician Assistant

## 2020-12-27 NOTE — Telephone Encounter (Signed)
..   Medicaid Managed Care   Unsuccessful Outreach Note  12/27/2020 Name: Sara Munoz MRN: [id number] DOB: [birth year]  Referred by: [name], [name] Reason for referral : High Risk Managed Medicaid (Attempted to reach [name] today to get her scheduled for phone visits with the MM LCSW and the Pharmacist. I left my name and number on her VM.)   An unsuccessful telephone outreach was attempted today. The patient was referred to the case management team for assistance with care management and care coordination.   Follow Up Plan: The care management team will reach out to the patient again over the next 7-14 days.   [place]

## 2020-12-30 ENCOUNTER — Other Ambulatory Visit: Payer: Self-pay | Admitting: Family Medicine

## 2020-12-30 ENCOUNTER — Other Ambulatory Visit: Payer: Self-pay | Admitting: Gastroenterology

## 2020-12-30 DIAGNOSIS — R110 Nausea: Secondary | ICD-10-CM

## 2020-12-30 NOTE — Telephone Encounter (Signed)
Requested medication (s) are due for refill today: yes  Requested medication (s) are on the active medication list: yes  Last refill:  11/13/20 #20 1 refills  Future visit scheduled: no  Notes to clinic:  not delegated per protocol, overdue encounter greater than 3 months ago. No future visit scheduled.      Requested Prescriptions  Pending Prescriptions Disp Refills   ondansetron (ZOFRAN-ODT) 8 MG disintegrating tablet [Pharmacy Med Name: ONDANSETRON ODT 8 MG TABLET] 20 tablet 1    Sig: TAKE 1 TABLET (8 MG TOTAL) BY MOUTH 2 (TWO) TIMES DAILY.      Not Delegated - Gastroenterology: Antiemetics Failed - 12/30/2020  1:40 PM      Failed - This refill cannot be delegated      Failed - Valid encounter within last 6 months    Recent Outpatient Visits           9 months ago Encounter for tobacco use cessation counseling   [place], [place], [place]   10 months ago Chronic nausea   [facility] [place], Wendee Beavers, [place]   1 year ago Pre-op examination   [facility] [name], [place]   1 year ago Hypercholesterolemia   [facility] [name], [place]   2 years ago [facility] discharge follow-up   [place], Wendee Beavers, [place]

## 2021-01-03 ENCOUNTER — Telehealth: Payer: Self-pay

## 2021-01-03 NOTE — Telephone Encounter (Signed)
Patient has been taking Linzess 290mg  and has been taking the medication every day. Patient is out of the samples. She states the last time she took the medication a while ago. She states she went once or twice a week while on the medication. She states now she has not went in 9 days and is having abdominal pain bloating and nausea. She states did not think the medication worked as good as she thought it would

## 2021-01-04 ENCOUNTER — Other Ambulatory Visit: Payer: Self-pay | Admitting: Licensed Clinical Social Worker

## 2021-01-04 MED ORDER — TRULANCE 3 MG PO TABS
1.0000 | ORAL_TABLET | Freq: Every day | ORAL | 6 refills | Status: DC
Start: 2021-01-04 — End: 2021-01-15

## 2021-01-04 MED ORDER — TRULANCE 3 MG PO TABS: 1.0000 | ORAL_TABLET | Freq: Every day | ORAL | 3 refills | Status: DC

## 2021-01-04 NOTE — Telephone Encounter (Signed)
After calling the patient and speaking to her, she stated that she would like to try taking something stronger for her constipation since Linzess 290 mcg BID was not helping her any longer. Patient stated that she is back again having the same symptoms as she was having prior to taking Linzess. Therefore, she stated that she would rather try something else at this time. Patient stated that she is currently having the constipation, abdominal pain and nausea. Can you please advise.

## 2021-01-04 NOTE — Telephone Encounter (Signed)
Called patient back. However, I had to leave a voicemail letting her know that Dr. Vicente Males wanted her to stop taking Linzess and start taking Trulance. He also wanted [place] to give patient samples, but we do not currently have any to give. Therefore, I will be sending the script to her pharmacy with a coupon information so hopefully she could get a discount.

## 2021-01-04 NOTE — Patient Outreach (Signed)
Medicaid Managed Care Social Work Note  01/04/2021 Name:  Sara Munoz MRN:  [id number] DOB:  1966-06-03  Sara Munoz is an 55 y.o. year old female who is a primary patient of [name], [place].  The [facility] Managed Care Coordination team was consulted for assistance with:    [name] was given information about Medicaid Managed CareCoordination services today. [name] agreed to services and verbal consent obtained.  Engaged with patient  for by telephone forinitial visit in response to referral for case management and/or care coordination services.   Assessments/Interventions:  Review of past medical history, allergies, medications, health status, including review of consultants reports, laboratory and other test data, was performed as part of comprehensive evaluation and provision of chronic care management services.  SDOH: (Social Determinant of Health) assessments and interventions performed: SDOH Interventions    Flowsheet Row Most Recent Value  SDOH Interventions   SDOH Interventions for the Following Domains Stress, Depression  Stress Interventions Provide Counseling  Depression Interventions/Treatment  Referral to Psychiatry       Advanced Directives Status:  See Care Plan for related entries.  Care Plan                 Allergies  Allergen Reactions   Flexeril [Cyclobenzaprine] Hives and Swelling    Facial/lip swelling      Levofloxacin Hives and Swelling   Phenergan [Promethazine Hcl] Other (See Comments)    Agitation.    Toradol [Ketorolac Tromethamine] Swelling and Other (See Comments)    Facial/tongue swelling    Tramadol Hives, Swelling and Other (See Comments)    Lip swelling    Zoloft [Sertraline Hcl] Swelling    Tongue swelling       Medications Reviewed Today     Reviewed by [name], RN (Registered Nurse) on 12/20/20 at 1514  Med List Status: <None>   Medication Order Taking? Sig Documenting Provider Last Dose Status  Informant  albuterol (PROVENTIL HFA;VENTOLIN HFA) 108 (90 Base) MCG/ACT inhaler [id number] Yes Inhale 2 puffs into the lungs every 6 (six) hours as needed for wheezing or shortness of breath. [name], [name] Taking Active Self           Med Note [name] Mar 20, 2020  3:20 PM)    albuterol (PROVENTIL) (2.5 MG/3ML) 0.083% nebulizer solution [id number] Yes Take 3 mLs (2.5 mg total) by nebulization every 6 (six) hours as needed for wheezing or shortness of breath.  Patient taking differently: Take 2.5 mg by nebulization 2 (two) times daily.   [name], MD Taking Active   amLODipine (NORVASC) 5 MG tablet [id number] Yes Take 1 tablet (5 mg total) by mouth daily. [name], [name] Taking Active Self  atorvastatin (LIPITOR) 20 MG tablet [id number] Yes Take 1 tablet (20 mg total) by mouth daily. [name], [name] Taking Active Self  budesonide-formoterol [facility]) 160-4.5 MCG/ACT inhaler [id number] No Inhale 2 puffs into the lungs 2 (two) times daily.  Patient not taking: Reported on 12/20/2020   [name], [name] Not Taking Active            Med Note [name], [place] A   Wed May 10, 2020  1:01 PM) Pt confirmed she does spiriva bid and symbicort qd  buPROPion (WELLBUTRIN SR) 150 MG 12 hr tablet [id number] No TAKE 1 TABLET BY MOUTH DAILY FOR 3 DAYS, THEN 1 TABLET TWICE DAILY. STOP SMOKING 14 DAYS AFTER STARTING MEDICATION  Patient not taking: No sig reported   [name], [name] Not Taking Active   docusate sodium (COLACE) 100 MG capsule [id number] Yes Take 200 mg by mouth daily as needed for moderate constipation.  [provider] Taking Active Self  DULoxetine (CYMBALTA) 60 MG capsule [id number] No Take 1 capsule (60 mg total) by mouth daily. Please call [phone] to schedule appt or may request future refills from PCP.  Patient not taking: No sig reported   [name], MD Not Taking Active   gabapentin (NEURONTIN) 600 MG tablet [id number] Yes Take  600 mg by mouth 3 (three) times daily. [provider] Taking Active Self  hydrOXYzine (ATARAX/VISTARIL) 50 MG tablet [id number] Yes Take 1 tablet (50 mg total) by mouth 3 (three) times daily as needed. Sable Feil, [name] Taking Active   lisinopril (PRINIVIL,ZESTRIL) 40 MG tablet [id number] Yes Take 1 tablet (40 mg total) by mouth daily. [name], [name] Taking Active Self  metaxalone (SKELAXIN) 800 MG tablet [id number] No Take 800 mg by mouth 3 (three) times daily.  Patient not taking: Reported on 12/20/2020   [provider] Not Taking Active Self  methocarbamol (ROBAXIN) 750 MG tablet [id number] No Take 1 tablet (750 mg total) by mouth every 6 (six) hours as needed for muscle spasms.  Patient not taking: Reported on 12/20/2020   Sable Feil, [name] Not Taking Active   methylPREDNISolone (MEDROL DOSEPAK) 4 MG TBPK tablet [id number] No Take Tapered dose as directed  Patient not taking: Reported on 12/20/2020   Sable Feil, [name] Not Taking Active   metoprolol tartrate (LOPRESSOR) 25 MG tablet [id number] Yes TAKE 1 TABLET BY MOUTH TWICE A DAY Pollak, Adriana M, [name] Taking Active   naloxone [facility]) 4 MG/0.1ML LIQD nasal spray kit [id number]  Place 1 spray into the nose as needed (opioid overdose). [provider]  Active Self           Med Note [name]   Wed May 10, 2020 12:59 PM) Has both the nasal spray and injection  Naloxone HCl (NARCAN IJ) [id number]  Inject 1 Dose as directed as needed (opioid overdose). [provider]  Active Self           Med Note [name]   Wed May 10, 2020 12:59 PM) Has both the nasal spray and injection  nitroGLYCERIN (NITROSTAT) 0.4 MG SL tablet [id number]  Place 1 tablet (0.4 mg total) under the tongue every 5 (five) minutes x 3 doses as needed for chest pain. Demetrios Loll, MD  Active Self  omeprazole (PRILOSEC) 40 MG capsule [id number] Yes TAKE 1 CAPSULE BY MOUTH TWICE A DAY [name], MD Taking Active    omeprazole (PRILOSEC) 40 MG capsule [id number]  Take 1 capsule (40 mg total) by mouth 2 (two) times daily. [name], MD  Active   ondansetron [facility]) 4 MG tablet [id number] No Take 1 tablet (4 mg total) by mouth every 8 (eight) hours as needed for nausea or vomiting.  Patient not taking: Reported on 12/20/2020   [name], MD Not Taking Active   ondansetron (ZOFRAN-ODT) 8 MG disintegrating tablet [id number] Yes TAKE 1 TABLET (8 MG TOTAL) BY MOUTH 2 (TWO) TIMES DAILY. Birdie Sons, MD Taking Active   oxyCODONE (ROXICODONE) 5 MG immediate release tablet [id number] No Take 4 tablets (20 mg total) by mouth every 6 (six) hours as needed (pain).  Patient not taking: Reported on 12/20/2020   Judith Part, MD Not  Taking Active   sucralfate (CARAFATE) 1 GM/10ML suspension [id number] Yes TAKE 10 MLS (1 G TOTAL) BY MOUTH 4 (FOUR) TIMES DAILY. [name], MD Taking Active   Tiotropium Bromide Monohydrate (SPIRIVA RESPIMAT) 2.5 MCG/ACT AERS [id number] Yes Inhale 1 puff into the lungs daily.  Patient taking differently: Inhale 1 puff into the lungs 2 (two) times daily.   [name], [name] Taking Active            Med Note [name]   Wed May 10, 2020  1:01 PM) Pt confirmed she does spiriva bid and symbicort qd            Patient Active Problem List   Diagnosis Date Noted   Lumbar radiculopathy 05/23/2020   Sepsis ([place]) 10/19/2018   Severe sepsis ([place]) 10/19/2018   Cervical radiculopathy 02/12/2018   Chronic migraine 02/12/2018   Paresthesia 08/29/2017   Neck pain 08/29/2017   Daily headache 08/29/2017   Chronic active hepatitis ([place]) 07/29/2017   Neurogenic pain 07/31/2016   Chronic pain syndrome 06/05/2016   Carotid artery stenosis 02/27/2016   Chronic constipation 02/27/2016   Chronic nausea 02/27/2016   Hypercholesterolemia 02/27/2016   Osteoarthritis 02/27/2016   PTSD (post-traumatic stress disorder) 02/27/2016   TIA (transient ischemic attack) 02/27/2016   Long  term current use of opiate analgesic 02/27/2016   Long term prescription opiate use 02/27/2016   Encounter for pain management consult 02/27/2016   Chronic hip pain (Location of Tertiary source of pain) (Bilateral) (L>R) 02/27/2016   Chronic knee pain (Bilateral) (L>R) 02/27/2016   Chronic shoulder pain (Bilateral) (L>R) 02/27/2016   Chronic sacroiliac joint pain (Bilateral) (L>R) 02/27/2016   Chronic low back pain (Location of Primary Source of Pain) (Bilateral) (L>R) 02/27/2016   Chronic lower extremity pain (Location of Secondary source of pain) (Bilateral) (L>R) 02/27/2016   Osteoarthritis of hip (Bilateral) (L>R) 02/27/2016   Chronic neck pain (posterior midline) (Bilateral) (L>R) 02/27/2016   Cervicogenic headache (Bilateral) (L>R) 02/27/2016   Occipital headache (Bilateral) (L>R) 02/27/2016   Chronic upper extremity pain (Bilateral) (L>R) 02/27/2016   Chronic Cervical radicular pain (Bilateral) (L>R) 02/27/2016   Chronic lumbar radicular pain (Right) (L5 dermatome) 02/27/2016   Lumbar facet syndrome (Bilateral) (L>R) 02/27/2016   Long term prescription benzodiazepine use 02/27/2016   Hypertension    Chronic obstructive pulmonary disease ([place]) 10/09/2015   GERD (gastroesophageal reflux disease) 10/09/2015   Non-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/2014 08/02/2015   Hyperlipidemia 08/02/2015   Costochondritis 08/02/2015   Drug abuse, 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   GAD (generalized anxiety disorder) 09/03/2014   Narcotic dependence ([place]) 09/03/2014   PVD (peripheral vascular disease) ([place]) 09/03/2014   Smoker 09/03/2014   Cigarette nicotine dependence with nicotine-induced disorder 07/08/2014   Anemia of chronic disease 03/19/2014   Narcotic abuse, continuous ([place]) 03/19/2014   Polysubstance abuse ([place]) 03/19/2014   MSSA (methicillin susceptible Staphylococcus aureus) septicemia ([place]) 03/07/2014   Hypomagnesemia 01/04/2014   Chronic cough 09/04/2011   Sleep apnea, obstructive 09/04/2011     Conditions to be addressed/monitored per PCP order:  Anxiety and Depression  Care Plan : LCSW Plan of Care  Updates made by Greg Cutter, LCSW since 01/04/2021 12:00 AM     Problem: Anxiety Identification (Anxiety)      Long-Range Goal: Anxiety Symptoms Identified   Start Date: 01/04/2021  Priority: High  Note:   Timeframe:  Long-Range Goal Priority:  High Start Date:    01/04/21  Expected End Date:   ongoing                    Follow Up Date 01/26/21   Current barriers:   Chronic Mental Health needs related to anxiety, stress and depression Limited social support, Mental Health Concerns , and Social Isolation Needs Support, Education, and Care Coordination in order to meet unmet mental health needs. Clinical Goal(s): patient will work with SW to address concerns related to increasing coping skills to help managed mood patient will work with [place] to address needs related to finding a mental health psychiatrist    Clinical Interventions:  Assessed patient's previous and current treatment, coping skills, support system and barriers to care  Patient reports that she is in need of only medication management. She moved from [facility] to [facility]. Patient reports that she already has a Social worker in [place] at [employer] that she has built Museum/gallery exhibitions officer with. Patient is agreeable to [place] referral in order to find a psychiatrist for medication management. Referral placed on 01/04/21. Review various resources, discussed options and provided patient information about Discussed several options for long term counseling based on need and insurance Depression screen reviewed , Solution-Focused Strategies, Mindfulness or Relaxation Training, Active listening / Reflection utilized , Emotional Supportive Provided, Brief CBT , and Suicidal Ideation/Homicidal Ideation assessed: ; Patient interviewed and appropriate assessments  performed Discussed plans with patient for ongoing care management follow up and provided patient with direct contact information for care management team Assisted patient/caregiver with obtaining information about health plan benefits Provided education and assistance to client regarding Advanced Directives. Discussed several options for long term counseling based on need and insurance.  Inter-disciplinary care team collaboration (see longitudinal plan of care) Patient Goals/Self-Care Activities: Over the next 120 days - barriers to treatment adherence identified - complementary therapy use encouraged - counseling provided - depression screen reviewed - self-awareness of emotional triggers encouraged - strategies to manage emotional triggers promoted - avoid negative self-talk - develop a personal safety plan - develop a plan to deal with triggers like holidays, anniversaries - exercise at least 2 to 3 times per week - have a plan for how to handle bad days - journal feelings and what helps to feel better or worse - spend time or talk with others at least 2 to 3 times per week - spend time or talk with others every day - watch for early signs of feeling worse - write in journal every day - begin personal counseling - call and visit an old friend - check out volunteer opportunities - join a support group - laugh; watch a funny movie or comedian - learn and use visualization or guided imagery - perform a random act of kindness - practice relaxation or meditation daily - start or continue a personal journal - talk about feelings with a friend, family or spiritual advisor - practice positive thinking and self-talk I have placed a referral with [facility] to assist with connecting you with a mental health provider. they will contact you once a provider is located.       Follow up:  Patient agrees to Care Plan and Follow-up.  Plan: The Managed Medicaid care management team will  reach out to the patient again over the next 30 days.  Date of next scheduled Social Work care management/care coordination outreach:  01/26/21  .[name], BSW, MSW, [facility] Care Management [email or web address] Phone:  [phone]

## 2021-01-04 NOTE — Addendum Note (Signed)
Addended by: Wayna Chalet on: 01/04/2021 11:36 AM   Modules accepted: Orders

## 2021-01-04 NOTE — Patient Instructions (Signed)
Visit Information  Sara Munoz was given information about Medicaid Managed Care team care coordination services as a part of their [place] Complete Medicaid benefit. Sara Munoz verbally consented to engagement with the [facility] Managed Care team.   For questions related to your [facility] Complete Medicaid health plan, please call: [phone]  If you would like to schedule transportation through your [place] Complete Medicaid plan, please call the following number at least 2 days in advance of your appointment: [phone].   Call the [place] at [phone], at any time, 24 hours a day, 7 days a week. If you are in danger or need immediate medical attention call 911.  If you would like help to quit smoking, call 1-800-QUIT-NOW 872-773-0553) OR Espaol: 1-855-Djelo-Ya (0-354-656-8127) o para ms informacin haga clic aqu or Text READY to 200-400 to register via text  Sara Munoz - following are the goals we discussed in your visit today:   Goals Addressed             This Visit's Progress    Manage My Emotions       Timeframe:  Long-Range Goal Priority:  High Start Date:    01/04/21                         Expected End Date:   ongoing                    Follow Up Date 01/26/21    - begin personal counseling - call and visit an old friend - check out volunteer opportunities - join a support group - laugh; watch a funny movie or comedian - learn and use visualization or guided imagery - perform a random act of kindness - practice relaxation or meditation daily - start or continue a personal journal - talk about feelings with a friend, family or spiritual advisor - practice positive thinking and self-talk    Why is this important?   When you are stressed, down or upset, your body reacts too.  For example, your blood pressure may get higher; you may have a headache or stomachache.  When your emotions get the best of you, your body's ability to  fight off cold and flu gets weak.  These steps will help you manage your emotions.     Notes:        [name], BSW, MSW, [employer] Managed Medicaid [email or web address] Phone: [phone]

## 2021-01-11 ENCOUNTER — Telehealth: Payer: Self-pay | Admitting: Physician Assistant

## 2021-01-11 ENCOUNTER — Other Ambulatory Visit: Payer: Self-pay | Admitting: Gastroenterology

## 2021-01-11 ENCOUNTER — Other Ambulatory Visit: Payer: Self-pay

## 2021-01-11 NOTE — Telephone Encounter (Signed)
Caller name: Ovid Curd  Relation to pt: [facility]  Call back number: [phone]   Reason for call:  Patient has been non compliant with taking her medications due to not having transportation. Caller would like all [facility], [place], [place] [address]  [phone] because they will deliver her medications to her home. Caller did state all medications.

## 2021-01-11 NOTE — Patient Instructions (Signed)
Visit Information  Ms. Clowdus was given information about Medicaid Managed Care team care coordination services as a part of their [place] Complete Medicaid benefit. [name] verbally consented to engagement with the [facility] Managed Care team.   For questions related to your [facility] Complete Medicaid health plan, please call: [phone]  If you would like to schedule transportation through your [place] Complete Medicaid plan, please call the following number at least 2 days in advance of your appointment: [phone].   Call the [facility] at [phone], at any time, 24 hours a day, 7 days a week. If you are in danger or need immediate medical attention call 911.  If you would like help to quit smoking, call 1-800-QUIT-NOW 906-508-2874) OR Espaol: 1-855-Djelo-Ya (8-315-176-1607) o para ms informacin haga clic aqu or Text READY to 200-400 to register via text  [name] - following are the goals we discussed in your visit today:   Goals Addressed   None     Please see education materials related to Mental Health provided as print materials.   Patient verbalizes understanding of instructions provided today.   The Managed Medicaid care management team will reach out to the patient again over the next 90 days.   [place] Constable, Pharm.D., Managed Medicaid Pharmacist [phone]   Following is a copy of your plan of care:  Patient Care Plan: General Plan of Care (Adult)     Problem Identified: Health Promotion or Disease Self-Management (General Plan of Care)   Priority: High  Onset Date: 12/20/2020     Long-Range Goal: Self-Management Plan Developed   Start Date: 12/20/2020  Expected End Date: 03/22/2021  This Visit's Progress: Not on track  Priority: High  Note:     Current Barriers:  Knowledge Deficits related to short term plan for care coordination needs and long term plans for chronic disease management needs.  Patient with  many chronic health conditions-CAD, HTN, PVD, TIA, migraines, COPD, OSA, GERD, hepatitis, tobacco use, osteoarthritis, chronic pain, anxiety. Nurse Case Manager Clinical Goal(s):  patient will work with care management team to address care coordination and chronic disease management needs related to Disease Management Educational Needs Care Coordination Medication Management and Education Medication Reconciliation Medication Assistance  Psychosocial Support Mental Health Counseling   Interventions:  Evaluation of current treatment plan  and patient's adherence to plan as established by provider. Advised patient to contact PCP regarding blood pressure. Reviewed medications with patient. Collaborated with pharmacy regarding medications.  Collaborated with SW regarding anxiety. Discussed plans with patient for ongoing care management follow up and provided patient with direct contact information for care management team Advised patient, providing education and rationale, to monitor blood pressure daily and record, calling PCPfor findings outside established parameters.  Reviewed scheduled/upcoming provider appointments. Social Work referral for anxiety, PTSD. Pharmacy referral for medication review. Patient given 1-800-QUITNOW for smoking cessation if interested. Self Care Activities:  Patient will self administer medications as prescribed Patient will attend all scheduled provider appointments Patient will call pharmacy for medication refills Patient will continue to perform ADL's independently Patient will call provider office for new concerns or questions Patient Goals:  In the next 30 days, patient will meet with SW to discuss anxiety. In the next 30 days, patient will meet with Pharmacist to review medications, In the next 90 days, patient will attend all scheduled appointments. - Follow Up Plan: The patient has been provided with contact information for the care management team  and has been advised  to call with any health related questions or concerns.  The care management team will reach out to the patient again over the next 30 days.  Evidence-based guidance:   Review biopsychosocial determinants of health screens.  Review need for preventive screening based on age, sex, family history and health history.  Determine level of modifiable health risk.   Discuss identified risks.  Identify areas where behavior change may lead to improved health.  Promote healthy lifestyle.  Evoke change talk using open-ended questions, pros and cons, as well as looking forward.  Identify and manage conditions or preconditions to reduce health risk.  Implement additional goals and interventions based on identified risk factors.     Task: Mutually Develop and Royce Macadamia Achievement of Patient Goals   Note:   Care Management Activities:    - verbalization of feelings encouraged    Notes:     Patient Care Plan: LCSW Plan of Care     Problem Identified: Anxiety Identification (Anxiety)      Long-Range Goal: Anxiety Symptoms Identified   Start Date: 01/04/2021  Priority: High  Note:   Timeframe:  Long-Range Goal Priority:  High Start Date:    01/04/21                         Expected End Date:   ongoing                    Follow Up Date 01/26/21   Current barriers:   Chronic Mental Health needs related to anxiety, stress and depression Limited social support, Mental Health Concerns , and Social Isolation Needs Support, Education, and Care Coordination in order to meet unmet mental health needs. Clinical Goal(s): patient will work with SW to address concerns related to increasing coping skills to help managed mood patient will work with [place] to address needs related to finding a mental health psychiatrist    Clinical Interventions:  Assessed patient's previous and current treatment, coping skills, support system and barriers to care  Patient reports that she is in  need of only medication management. She moved from [facility] to [facility]. 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 Patient is agreeable to [place] referral in order to find a psychiatrist for medication management. Referral placed on 01/04/21. Review various resources, discussed options and provided patient information about Discussed several options for long term counseling based on need and insurance Depression screen reviewed , Solution-Focused Strategies, Mindfulness or Relaxation Training, Active listening / Reflection utilized , Emotional Supportive Provided, Brief CBT , and Suicidal Ideation/Homicidal Ideation assessed: ; Patient interviewed and appropriate assessments performed Discussed plans with patient for ongoing care management follow up and provided patient with direct contact information for care management team Assisted patient/caregiver with obtaining information about health plan benefits Provided education and assistance to client regarding Advanced Directives. Discussed several options for long term counseling based on need and insurance.  Inter-disciplinary care team collaboration (see longitudinal plan of care) Patient Goals/Self-Care Activities: Over the next 120 days - barriers to treatment adherence identified - complementary therapy use encouraged - counseling provided - depression screen reviewed - self-awareness of emotional triggers encouraged - strategies to manage emotional triggers promoted - avoid negative self-talk - develop a personal safety plan - develop a plan to deal with triggers like holidays, anniversaries - exercise at least 2 to 3 times per week -  have a plan for how to handle bad days - journal feelings and what helps to feel better or worse - spend time or talk with others at least 2 to 3 times per week - spend time or talk with others every  day - watch for early signs of feeling worse - write in journal every day - begin personal counseling - call and visit an old friend - check out volunteer opportunities - join a support group - laugh; watch a funny movie or comedian - learn and use visualization or guided imagery - perform a random act of kindness - practice relaxation or meditation daily - start or continue a personal journal - talk about feelings with a friend, family or spiritual advisor - practice positive thinking and self-talk I have placed a referral with [facility] to assist with connecting you with a mental health provider. they will contact you once a provider is located.      Patient Care Plan: Medication Management     Problem Identified: Health Promotion or Disease Self-Management (General Plan of Care)      Goal: Medication Coordination   Note:   Current Barriers:  Unable to self administer medications as prescribed Does not contact provider office for questions/concerns   Pharmacist Clinical Goal(s):  Over the next 30 days, patient will contact provider office for questions/concerns as evidenced notation of same in electronic health record through collaboration with PharmD and provider.    Interventions: Inter-disciplinary care team collaboration (see longitudinal plan of care) Comprehensive medication review performed; medication list updated in electronic medical record  Health Maintenance:  Patient Goals/Self-Care Activities Over the next 30 days, patient will:  - collaborate with provider on medication access solutions  Follow Up Plan: The patient has been provided with contact information for the care management team and has been advised to call with any health related questions or concerns.      Task: Mutually Develop and Royce Macadamia Achievement of Patient Goals   Note:   Care Management Activities:    - verbalization of feelings encouraged    Notes:

## 2021-01-11 NOTE — Patient Outreach (Signed)
Medicaid Managed Care    Pharmacy Note  01/11/2021 Name: Sara Munoz MRN: [id number] DOB: 1966/03/16  Sara Munoz is a 55 y.o. year old female who is a primary care patient of Sara Munoz, [place]. The [facility] Managed Care Coordination team was consulted for assistance with disease management and care coordination needs.    Engaged with patient Engaged with patient by telephone for initial visit in response to referral for case management and/or care coordination services.  Sara Munoz was given information about Managed Medicaid Care Coordination team services today. Sara Munoz agreed to services and verbal consent obtained.   Objective:  Lab Results  Component Value Date   CREATININE 0.75 05/16/2020   CREATININE 0.84 11/17/2019   CREATININE 0.92 08/11/2019    Lab Results  Component Value Date   HGBA1C 5.9 (H) 08/11/2019       Component Value Date/Time   CHOL 251 (H) 02/20/2018 1230   TRIG 220 (H) 02/20/2018 1230   HDL 46 02/20/2018 1230   CHOLHDL 5.5 (H) 02/20/2018 1230   CHOLHDL 5.3 08/02/2015 0606   VLDL 24 08/02/2015 0606   LDLCALC 161 (H) 02/20/2018 1230    Other: (TSH, CBC, Vit D, etc.)  Clinical ASCVD: Yes  The 10-year ASCVD risk score Sara Munoz DC Jr., et al., 2013) is: 17%   Values used to calculate the score:     Age: 82 years     Sex: Female     Is Non-Hispanic African American: No     Diabetic: No     Tobacco smoker: Yes     Systolic Blood Pressure: 825 mmHg     Is BP treated: Yes     HDL Cholesterol: 46 mg/dL     Total Cholesterol: 251 mg/dL    Other: (CHADS2VASc if Afib, PHQ9 if depression, MMRC or CAT for COPD, ACT, DEXA)  BP Readings from Last 3 Encounters:  12/08/20 (!) 181/110  11/16/20 134/84  05/24/20 128/70    Assessment/Interventions: Review of patient past medical history, allergies, medications, health status, including review of consultants reports, laboratory and other test data, was performed as part of comprehensive  evaluation and provision of chronic care management services.   HTN BP Readings from Last 3 Encounters:  12/08/20 (!) 181/110  11/16/20 134/84  05/24/20 128/70  Amlodipine 83m Lisinopril 435mMetoprolol 2540mID Tartrate Extensive time spent on counseling patient on blood pressure goal and impact of each antihypertensive medication on their blood pressure and risk reduction for CV disease.  Used analogies to explain the need for multiple antihypertensive medications to achieve BP goals and that it is often a silent disease with no symptoms.  Plan: At goal,  patient stable/ symptoms controlled   Cholesterol Atorvastatin 32m9mver 15 minutes spent counseling patient on role of hyperlipidemia on heart disease and the impact of each lipid subclass with emphasis on LDL and heart disease risk.  Explained role of statins in prevention of CV disease complications and actual risk for adverse effects with statin treatment.  Counseled patient on genetic component placing them at risk for hyperlipidemia. Plan: Will try to ask PCP to increase to max dose at future visit, current PCP has left practice and patient has no active PCP so unable to do so today  Pulm Symbicort 160/4.5 Tiotropium Plan: At goal,  patient stable/ symptoms controlled   Mental Health Depression screen PHQ [facility] 01/11/2021 04/03/2020 02/20/2018  Decreased Interest _0 Down, Depressed, Hopeless 2 0 0  PHQ - 2 Score _0 Altered sleeping _1 Tired, decreased energy _2 Change in appetite _3 Feeling bad or failure about yourself  1 0 0  Trouble concentrating 2 0 1  Moving slowly or fidgety/restless 3 0 1  Suicidal thoughts 0 0 0  PHQ-9 Score _4 Difficult doing work/chores Very difficult Somewhat difficult Somewhat difficult  Some recent data might be hidden  Hydroxyzine 35m Tried/Failed: Bupropion 1590m12hr Duloxetine 6069muly 2022: Patient could use therapy but she stopped taking meds years ago  (Unable to get refills) and wasn't able to start back up. She doesn't have an active PCP (See above) so unable to ask new PCP to re-start Duloxetine  Pain 01/11/21:  With Meds: 6/10 Without Meds: 10/10 Meloxicam Cyclobenzaprine Suboxone (Causing constipation) Gabapentin 600m66mD Tried/Failed: Oxycodone 5mg 1mhocarbamol Plan: At goal,  patient stable/ symptoms controlled   GERD (Hx of Bleeding) -Omeprazole 40mg 37mPlan: At goal,  patient stable/ symptoms controlled   IBS Plecanatide (Trulance) July 2022: Unable to get due to requiring a PA. Contacted office to get PA completed  Meds per patient: Medication Name PCP Transfer (put Dr.'s name) -Timing Last Fill Date & Day Supply Format: MM/DD/YY - DS (If last fill/DS unavailable, list pt.'s quantity on hand) Anticipated next due date  Format: MM/DD/YY      BB  B  L  EM  BT    Metoprolol 25mg T103mate AdrianaCarles [id number]  12/22/20 for 90 days 04/21/21  Cyclobenzaprine VIAL  Sara Munoz [phone]/16 for 30 days 01/22/21  Proair Sara Munoz       12/22/20 for 30 days 01/22/21  Nitroglycerin 0.4mg Adr69ma Munoz        HOLD  Albuterol solution Sara Munoz        HOLD  Ondansetron 4mg tabl42m Sara Munoz      12/22/20 for 30 days 01/22/21  Ondansetron 8mg ODT  73mA,Sara Munoz      12/22/20 for 30 days 01/22/21  Amlodipine 5mg Adrian47mollak   1    08/05/20 for 90 days (Non-Compliant) 01/22/21  Atorvastatin 20mg Adrian35mllCarles Collet22 for 30 days 01/22/21  Symbicort 160-4.5 Sara PollCarles Collet2 for 30 days 01/22/21  Gabapentin 600mg TID  [id number],[id number]   _5 12/22/20 for 30 days 01/22/21  Hydroxyzine 50mg Sara 22mak   _6 12/08/20 for 30 days 01/22/21  Lisinopril 40mg Sara P74mk     1  12/22/20 for 30 days 01/22/21  Omeprazole 40mg BID  Sara Munoz,80mN  1  1  10/03/20 for 90 days 01/22/21  Trulance 3mg  Sara Munoz 38m   Never filled   Tiotropium 2.5mg Sara Polla62m  Carles Collet30 days  01/22/21  Meloxicam 15mg Sara Polla27mVICarles Colletfor 30 days 01/22/21     SDOH (Social Determinants of Health) assessments and interventions performed:    Care Plan  Allergies  Allergen Reactions   Flexeril [Cyclobenzaprine] Hives and Swelling    Facial/lip swelling      Levofloxacin Hives and Swelling   Phenergan [Promethazine Hcl] Other (See Comments)    Agitation.    Toradol [Ketorolac Tromethamine] Swelling and Other (See Comments)    Facial/tongue swelling    Tramadol Hives, Swelling and  Other (See Comments)    Lip swelling    Zoloft [Sertraline Hcl] Swelling    Tongue swelling       Medications Reviewed Today     Reviewed by Lane Hacker, [facility] (Pharmacist) on 01/11/21 at [place] List Status: <None>   Medication Order Taking? Sig Documenting Provider Last Dose Status Informant  albuterol (PROVENTIL HFA;VENTOLIN HFA) 108 (90 Base) MCG/ACT inhaler [id number] Yes Inhale 2 puffs into the lungs every 6 (six) hours as needed for wheezing or shortness of breath. Sara Post, [name] Taking Active Self           Med Note [name] Mar 20, 2020  3:20 PM)    albuterol (PROVENTIL) (2.5 MG/3ML) 0.083% nebulizer solution [id number] Yes Take 3 mLs (2.5 mg total) by nebulization every 6 (six) hours as needed for wheezing or shortness of breath.  Patient taking differently: Take 2.5 mg by nebulization 2 (two) times daily.   [name], MD Taking Active   amLODipine (NORVASC) 5 MG tablet [id number] Yes Take 1 tablet (5 mg total) by mouth daily. Sara Post, [name] Taking Active Self  atorvastatin (LIPITOR) 20 MG tablet [id number] Yes Take 1 tablet (20 mg total) by mouth daily. Sara Post, [name] Taking Active Self  budesonide-formoterol [facility]) 160-4.5 MCG/ACT inhaler [id number]  Inhale 2 puffs into the lungs 2 (two) times daily.  Patient not taking: Reported on 12/20/2020   Sara Post, [name]  Active            Med Note [name], [place] A    Wed May 10, 2020  1:01 PM) Pt confirmed she does spiriva bid and symbicort qd  buPROPion (WELLBUTRIN SR) 150 MG 12 hr tablet [id number] No TAKE 1 TABLET BY MOUTH DAILY FOR 3 DAYS, THEN 1 TABLET TWICE DAILY. STOP SMOKING 14 DAYS AFTER STARTING MEDICATION  Patient not taking: No sig reported   Munoz, Sara M, [name] Not Taking Active   docusate sodium (COLACE) 100 MG capsule [id number]  Take 200 mg by mouth daily as needed for moderate constipation.  [provider]  Active Self  DULoxetine (CYMBALTA) 60 MG capsule [id number] No Take 1 capsule (60 mg total) by mouth daily. Please call [phone] to schedule appt or may request future refills from PCP.  Patient not taking: No sig reported   [name], MD Not Taking Active   gabapentin (NEURONTIN) 600 MG tablet [id number] Yes Take 600 mg by mouth 3 (three) times daily. [provider] Taking Active Self  hydrOXYzine (ATARAX/VISTARIL) 50 MG tablet [id number] Yes Take 1 tablet (50 mg total) by mouth 3 (three) times daily as needed. Sable Feil, [name] Taking Active   lisinopril (PRINIVIL,ZESTRIL) 40 MG tablet [id number] Yes Take 1 tablet (40 mg total) by mouth daily. Sara Post, [name] Taking Active Self  metaxalone (SKELAXIN) 800 MG tablet [id number] No Take 800 mg by mouth 3 (three) times daily.  Patient not taking: No sig reported   [provider] Not Taking Consider Medication Status and Discontinue   methocarbamol (ROBAXIN) 750 MG tablet [id number] No Take 1 tablet (750 mg total) by mouth every 6 (six) hours as needed for muscle spasms.  Patient not taking: No sig reported   Sable Feil, [name] Not Taking Consider Medication Status and Discontinue   methylPREDNISolone (MEDROL DOSEPAK) 4 MG TBPK tablet [id number] No Take Tapered dose as directed  Patient not taking: No sig reported   Sable Feil,  [name] Not Taking Consider Medication Status and Discontinue   metoprolol tartrate (LOPRESSOR) 25 MG tablet [id number] Yes  TAKE 1 TABLET BY MOUTH TWICE A DAY Munoz, Sara M, [name] Taking Active   naloxone [facility]) 4 MG/0.1ML LIQD nasal spray kit [id number]  Place 1 spray into the nose as needed (opioid overdose). [provider]  Active Self           Med Note [name]   Wed May 10, 2020 12:59 PM) Has both the nasal spray and injection  Naloxone HCl (NARCAN IJ) [id number]  Inject 1 Dose as directed as needed (opioid overdose). [provider]  Active Self           Med Note [name]   Wed May 10, 2020 12:59 PM) Has both the nasal spray and injection  nitroGLYCERIN (NITROSTAT) 0.4 MG SL tablet [id number] Yes Place 1 tablet (0.4 mg total) under the tongue every 5 (five) minutes x 3 doses as needed for chest pain. Demetrios Loll, MD Taking Active Self  omeprazole (PRILOSEC) 40 MG capsule [id number] Yes TAKE 1 CAPSULE BY MOUTH TWICE A DAY [name], MD Taking Active   omeprazole (PRILOSEC) 40 MG capsule [id number] No Take 1 capsule (40 mg total) by mouth 2 (two) times daily.  Patient not taking: Reported on 01/11/2021   [name], MD Not Taking Consider Medication Status and Discontinue   ondansetron (ZOFRAN) 4 MG tablet [id number] No Take 1 tablet (4 mg total) by mouth every 8 (eight) hours as needed for nausea or vomiting.  Patient not taking: No sig reported   [name], MD Not Taking Consider Medication Status and Discontinue   ondansetron (ZOFRAN-ODT) 8 MG disintegrating tablet [id number] No TAKE 1 TABLET (8 MG TOTAL) BY MOUTH 2 (TWO) TIMES DAILY.  Patient not taking: Reported on 01/11/2021   Birdie Sons, MD Not Taking Consider Medication Status and Discontinue   oxyCODONE (ROXICODONE) 5 MG immediate release tablet [id number]  Take 4 tablets (20 mg total) by mouth every 6 (six) hours as needed (pain).  Patient not taking: Reported on 12/20/2020   Judith Part, MD  Active   Plecanatide [facility])) 3 MG TABS [id number] No Take 1 tablet by mouth daily.  Patient not taking: Reported  on 01/11/2021   [name], MD Not Taking Active   sucralfate (CARAFATE) 1 GM/10ML suspension [id number]  TAKE 10 MLS (1 G TOTAL) BY MOUTH 4 (FOUR) TIMES DAILY. [name], MD  Active   Tiotropium Bromide Monohydrate (SPIRIVA RESPIMAT) 2.5 MCG/ACT AERS [id number] Yes Inhale 1 puff into the lungs daily.  Patient taking differently: Inhale 1 puff into the lungs 2 (two) times daily.   Sara Post, [name] Taking Active            Med Note [name]   Wed May 10, 2020  1:01 PM) Pt confirmed she does spiriva bid and symbicort qd            Patient Active Problem List   Diagnosis Date Noted   Lumbar radiculopathy 05/23/2020   Sepsis ([place]) 10/19/2018   Severe sepsis ([place]) 10/19/2018   Cervical radiculopathy 02/12/2018   Chronic migraine 02/12/2018   Paresthesia 08/29/2017   Neck pain 08/29/2017   Daily headache 08/29/2017   Chronic active hepatitis ([place]) 07/29/2017   Neurogenic pain 07/31/2016   Chronic pain syndrome 06/05/2016   Carotid artery stenosis 02/27/2016   Chronic constipation 02/27/2016   Chronic nausea 02/27/2016   Hypercholesterolemia 02/27/2016  Osteoarthritis 02/27/2016   PTSD (Munoz-traumatic stress disorder) 02/27/2016   TIA (transient ischemic attack) 02/27/2016   Long term current use of opiate analgesic 02/27/2016   Long term prescription opiate use 02/27/2016   Encounter for pain management consult 02/27/2016   Chronic hip pain (Location of Tertiary source of pain) (Bilateral) (L>R) 02/27/2016   Chronic knee pain (Bilateral) (L>R) 02/27/2016   Chronic shoulder pain (Bilateral) (L>R) 02/27/2016   Chronic sacroiliac joint pain (Bilateral) (L>R) 02/27/2016   Chronic low back pain (Location of Primary Source of Pain) (Bilateral) (L>R) 02/27/2016   Chronic lower extremity pain (Location of Secondary source of pain) (Bilateral) (L>R) 02/27/2016   Osteoarthritis of hip (Bilateral) (L>R) 02/27/2016   Chronic neck pain (posterior midline) (Bilateral) (L>R)  02/27/2016   Cervicogenic headache (Bilateral) (L>R) 02/27/2016   Occipital headache (Bilateral) (L>R) 02/27/2016   Chronic upper extremity pain (Bilateral) (L>R) 02/27/2016   Chronic Cervical radicular pain (Bilateral) (L>R) 02/27/2016   Chronic lumbar radicular pain (Right) (L5 dermatome) 02/27/2016   Lumbar facet syndrome (Bilateral) (L>R) 02/27/2016   Long term prescription benzodiazepine use 02/27/2016   Hypertension    Chronic obstructive pulmonary disease ([place]) 10/09/2015   GERD (gastroesophageal reflux disease) 10/09/2015   Non-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/2014 08/02/2015   Hyperlipidemia 08/02/2015   Costochondritis 08/02/2015   Drug abuse, 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   GAD (generalized anxiety disorder) 09/03/2014   Narcotic dependence ([place]) 09/03/2014   PVD (peripheral vascular disease) ([place]) 09/03/2014   Smoker 09/03/2014   Cigarette nicotine dependence with nicotine-induced disorder 07/08/2014   Anemia of chronic disease 03/19/2014   Narcotic abuse, continuous ([place]) 03/19/2014   Polysubstance abuse ([place]) 03/19/2014   MSSA (methicillin susceptible Staphylococcus aureus) septicemia ([place]) 03/07/2014   Hypomagnesemia 01/04/2014   Chronic cough 09/04/2011   Sleep apnea, obstructive 09/04/2011    Conditions to be addressed/monitored:  Medication Access  Care Plan : Medication Management  Updates made by Lane Hacker, [place] since 01/11/2021 12:00 AM     Problem: Health Promotion or Disease Self-Management (General Plan of Care)      Goal: Medication Coordination   Note:   Current Barriers:  Unable to self administer medications as prescribed Does not contact provider office for questions/concerns   Pharmacist Clinical Goal(s):  Over the next 30 days, patient will contact provider office for questions/concerns as evidenced notation of same in electronic health record through collaboration with PharmD and provider.    Interventions: Inter-disciplinary care team  collaboration (see longitudinal plan of care) Comprehensive medication review performed; medication list updated in electronic medical record  Health Maintenance:  Patient Goals/Self-Care Activities Over the next 30 days, patient will:  - collaborate with provider on medication access solutions  Follow Up Plan: The patient has been provided with contact information for the care management team and has been advised to call with any health related questions or concerns.      Task: Mutually Develop and Royce Macadamia Achievement of Patient Goals   Note:   Care Management Activities:    - verbalization of feelings encouraged    Notes:        Medication Assistance:  Doesn't have car so is unable to get refills easily Plan: Verbal consent obtained for [facility] enhanced pharmacy services (medication synchronization, adherence packaging, delivery coordination). A medication sync plan was created to allow patient to get all medications delivered once every 30 to 90 days per patient preference. Patient understands they have freedom to choose pharmacy and clinical pharmacist will coordinate care  between all prescribers and [facility].   Follow up: Agree   Plan: The patient has been provided with contact information for the care management team and has been advised to call with any health related questions or concerns.   [place] Constable, Pharm.D., Managed Medicaid Pharmacist - [phone]

## 2021-01-12 ENCOUNTER — Other Ambulatory Visit: Payer: Self-pay

## 2021-01-12 DIAGNOSIS — R110 Nausea: Secondary | ICD-10-CM

## 2021-01-12 MED ORDER — ONDANSETRON HCL 4 MG PO TABS: 4.0000 mg | ORAL_TABLET | Freq: Three times a day (TID) | ORAL | 1 refills | Status: DC | PRN

## 2021-01-12 MED ORDER — OMEPRAZOLE 40 MG PO CPDR: 40.0000 mg | DELAYED_RELEASE_CAPSULE | Freq: Two times a day (BID) | ORAL | 3 refills | Status: DC

## 2021-01-12 MED ORDER — ONDANSETRON 8 MG PO TBDP: 8.0000 mg | ORAL_TABLET | Freq: Two times a day (BID) | ORAL | 1 refills | Status: DC

## 2021-01-12 MED ORDER — TRULANCE 3 MG PO TABS: 1.0000 | ORAL_TABLET | Freq: Every day | ORAL | 3 refills | Status: DC

## 2021-01-12 NOTE — Telephone Encounter (Signed)
Pt is overdue for an office visit.  I left a message to call back and schedule an appointment.     Thanks,   -Mickel Baas

## 2021-01-15 ENCOUNTER — Other Ambulatory Visit: Payer: Self-pay | Admitting: Gastroenterology

## 2021-01-15 MED ORDER — TRULANCE 3 MG PO TABS
1.0000 | ORAL_TABLET | Freq: Every day | ORAL | 3 refills | Status: DC
Start: 2021-01-15 — End: 2021-01-17

## 2021-01-15 NOTE — Addendum Note (Signed)
Addended by: Wayna Chalet on: 01/15/2021 01:41 PM   Modules accepted: Orders

## 2021-01-17 ENCOUNTER — Other Ambulatory Visit: Payer: Self-pay

## 2021-01-17 DIAGNOSIS — E785 Hyperlipidemia, unspecified: Secondary | ICD-10-CM

## 2021-01-17 DIAGNOSIS — R110 Nausea: Secondary | ICD-10-CM

## 2021-01-17 DIAGNOSIS — J432 Centrilobular emphysema: Secondary | ICD-10-CM

## 2021-01-17 DIAGNOSIS — I10 Essential (primary) hypertension: Secondary | ICD-10-CM

## 2021-01-17 MED ORDER — LINACLOTIDE 290 MCG PO CAPS: 290.0000 ug | ORAL_CAPSULE | Freq: Every day | ORAL | 3 refills | Status: DC

## 2021-01-17 MED ORDER — ALBUTEROL SULFATE HFA 108 (90 BASE) MCG/ACT IN AERS: 2.0000 | INHALATION_SPRAY | Freq: Four times a day (QID) | RESPIRATORY_TRACT | 0 refills | Status: DC | PRN

## 2021-01-17 MED ORDER — NITROGLYCERIN 0.4 MG SL SUBL: 0.4000 mg | SUBLINGUAL_TABLET | SUBLINGUAL | 0 refills | Status: DC | PRN

## 2021-01-17 MED ORDER — AMLODIPINE BESYLATE 5 MG PO TABS: 5.0000 mg | ORAL_TABLET | Freq: Every day | ORAL | 0 refills | Status: DC

## 2021-01-17 MED ORDER — METOPROLOL TARTRATE 25 MG PO TABS: 25.0000 mg | ORAL_TABLET | Freq: Two times a day (BID) | ORAL | 0 refills | Status: DC

## 2021-01-17 MED ORDER — ATORVASTATIN CALCIUM 20 MG PO TABS: 20.0000 mg | ORAL_TABLET | Freq: Every day | ORAL | 0 refills | Status: DC

## 2021-01-17 MED ORDER — SPIRIVA RESPIMAT 2.5 MCG/ACT IN AERS: 1.0000 | INHALATION_SPRAY | Freq: Every day | RESPIRATORY_TRACT | 0 refills | Status: DC

## 2021-01-17 MED ORDER — LISINOPRIL 40 MG PO TABS: 40.0000 mg | ORAL_TABLET | Freq: Every day | ORAL | 0 refills | Status: DC

## 2021-01-17 MED ORDER — BUDESONIDE-FORMOTEROL FUMARATE 160-4.5 MCG/ACT IN AERO: 2.0000 | INHALATION_SPRAY | Freq: Two times a day (BID) | RESPIRATORY_TRACT | 0 refills | Status: DC

## 2021-01-17 NOTE — Telephone Encounter (Signed)
Pt has an appointment with you 01/23/2021.  Will you be willing to refill her prescriptions until she gets to the appointment?  Thanks,   -Mickel Baas

## 2021-01-17 NOTE — Patient Outreach (Signed)
Sent direct msg to Ashley Royalty, she will try to get temporary fills of ALL patient's meds to her today. Called patient to let her know

## 2021-01-18 DIAGNOSIS — M79622 Pain in left upper arm: Secondary | ICD-10-CM | POA: Diagnosis not present

## 2021-01-18 DIAGNOSIS — M545 Low back pain, unspecified: Secondary | ICD-10-CM | POA: Diagnosis not present

## 2021-01-18 DIAGNOSIS — M25511 Pain in right shoulder: Secondary | ICD-10-CM | POA: Diagnosis not present

## 2021-01-18 DIAGNOSIS — M79662 Pain in left lower leg: Secondary | ICD-10-CM | POA: Diagnosis not present

## 2021-01-18 DIAGNOSIS — M79661 Pain in right lower leg: Secondary | ICD-10-CM | POA: Diagnosis not present

## 2021-01-18 DIAGNOSIS — G894 Chronic pain syndrome: Secondary | ICD-10-CM | POA: Diagnosis not present

## 2021-01-18 DIAGNOSIS — M25512 Pain in left shoulder: Secondary | ICD-10-CM | POA: Diagnosis not present

## 2021-01-18 DIAGNOSIS — M542 Cervicalgia: Secondary | ICD-10-CM | POA: Diagnosis not present

## 2021-01-18 DIAGNOSIS — M5136 Other intervertebral disc degeneration, lumbar region: Secondary | ICD-10-CM | POA: Diagnosis not present

## 2021-01-18 DIAGNOSIS — M79621 Pain in right upper arm: Secondary | ICD-10-CM | POA: Diagnosis not present

## 2021-01-22 ENCOUNTER — Other Ambulatory Visit: Payer: Self-pay | Admitting: Obstetrics and Gynecology

## 2021-01-22 ENCOUNTER — Other Ambulatory Visit: Payer: Self-pay

## 2021-01-22 DIAGNOSIS — G894 Chronic pain syndrome: Secondary | ICD-10-CM | POA: Diagnosis not present

## 2021-01-22 NOTE — Patient Outreach (Signed)
Medicaid Managed Care   Nurse Care Manager Note  01/22/2021 Name:  Sara Munoz MRN:  [id number] DOB:  02/12/1966  Sara Munoz is an 55 y.o. year old female who is a primary patient of Sara Munoz, [place].  The [facility] Managed Care Coordination team was consulted for assistance with:    Chronic healthcare management needs.  Sara Munoz was given information about Medicaid Managed Care Coordination team services today. Sara Munoz agreed to services and verbal consent obtained.  Engaged with patient by telephone for follow up visit in response to provider referral for case management and/or care coordination services.   Assessments/Interventions:  Review of past medical history, allergies, medications, health status, including review of consultants reports, laboratory and other test data, was performed as part of comprehensive evaluation and provision of chronic care management services.  SDOH (Social Determinants of Health) assessments and interventions performed: SDOH Interventions    Flowsheet Row Most Recent Value  SDOH Interventions   Financial Strain Interventions Intervention Not Indicated  Housing Interventions Intervention Not Indicated  Intimate Partner Violence Interventions Intervention Not Indicated  Physical Activity Interventions Intervention Not Indicated  Social Connections Interventions Intervention Not Indicated  Transportation Interventions Other (Comment)  [uses insurance co. transportation.]       Care Plan  Allergies  Allergen Reactions   Flexeril [Cyclobenzaprine] Hives and Swelling    Facial/lip swelling      Levofloxacin Hives and Swelling   Phenergan [Promethazine Hcl] Other (See Comments)    Agitation.    Toradol [Ketorolac Tromethamine] Swelling and Other (See Comments)    Facial/tongue swelling    Tramadol Hives, Swelling and Other (See Comments)    Lip swelling    Zoloft [Sertraline Hcl] Swelling    Tongue swelling        Medications Reviewed Today     Reviewed by Sara Medicus, RN (Registered Nurse) on 01/22/21 at 60  Med List Status: <None>   Medication Order Taking? Sig Documenting Provider Last Dose Status Informant  albuterol (PROVENTIL) (2.5 MG/3ML) 0.083% nebulizer solution [id number]  Take 3 mLs (2.5 mg total) by nebulization every 6 (six) hours as needed for wheezing or shortness of breath.  Patient taking differently: Take 2.5 mg by nebulization 2 (two) times daily.   [name], MD  Active   albuterol (VENTOLIN HFA) 108 (90 Base) MCG/ACT inhaler [id number]  Inhale 2 puffs into the lungs every 6 (six) hours as needed for wheezing or shortness of breath. [name], [name], [name]  Active   amLODipine (NORVASC) 5 MG tablet [id number]  Take 1 tablet (5 mg total) by mouth daily. [name], [name], [name]  Active   atorvastatin (LIPITOR) 20 MG tablet [id number]  Take 1 tablet (20 mg total) by mouth daily. [name], Driscilla Grammes  Active   budesonide-formoterol [facility]) 160-4.5 MCG/ACT inhaler [id number]  Inhale 2 puffs into the lungs 2 (two) times daily. [name], [name], [name]  Active   buprenorphine-naloxone (SUBOXONE) 8-2 mg SUBL SL tablet [id number] Yes Place 1 tablet under the tongue daily. [provider]  Active Self  buPROPion (WELLBUTRIN SR) 150 MG 12 hr tablet [id number]  TAKE 1 TABLET BY MOUTH DAILY FOR 3 DAYS, THEN 1 TABLET TWICE DAILY. STOP SMOKING 14 DAYS AFTER STARTING MEDICATION  Patient not taking: No sig reported   Pollak, Adriana M, [name]  Active   cyclobenzaprine (FLEXERIL) 10 MG tablet [id number] Yes Take 10 mg by mouth 2 (two) times daily as needed for muscle spasms.  [provider]  Active Self  docusate sodium (COLACE) 100 MG capsule [id number]  Take 200 mg by mouth daily as needed for moderate constipation.  [provider]  Active Self  DULoxetine (CYMBALTA) 60 MG capsule [id number]  Take 1 capsule (60 mg total) by mouth daily. Please call [phone]  to schedule appt or may request future refills from PCP.  Patient not taking: No sig reported   [name], MD  Active   gabapentin (NEURONTIN) 600 MG tablet [id number]  Take 600 mg by mouth 4 (four) times daily. [provider]  Active Self  hydrOXYzine (ATARAX/VISTARIL) 50 MG tablet [id number]  Take 1 tablet (50 mg total) by mouth 3 (three) times daily as needed. Sable Feil, [name]  Active   linaclotide Rolan Lipa) 290 MCG CAPS capsule [id number]  Take 1 capsule (290 mcg total) by mouth daily before breakfast. [name], MD  Active   lisinopril (ZESTRIL) 40 MG tablet [id number]  Take 1 tablet (40 mg total) by mouth daily. [name], [name], [name]  Active   meloxicam (MOBIC) 15 MG tablet [id number] Yes Take 15 mg by mouth daily. [provider]  Active Self  metaxalone (SKELAXIN) 800 MG tablet [id number]  Take 800 mg by mouth 3 (three) times daily.  Patient not taking: No sig reported   [provider]  Active   methocarbamol (ROBAXIN) 750 MG tablet [id number]  Take 1 tablet (750 mg total) by mouth every 6 (six) hours as needed for muscle spasms.  Patient not taking: No sig reported   Sable Feil, [name]  Active   methylPREDNISolone (MEDROL DOSEPAK) 4 MG TBPK tablet [id number]  Take Tapered dose as directed  Patient not taking: No sig reported   Sable Feil, [name]  Active   metoprolol tartrate (LOPRESSOR) 25 MG tablet [id number]  Take 1 tablet (25 mg total) by mouth 2 (two) times daily. [name], [name], [name]  Active   naloxone [facility]) 4 MG/0.1ML LIQD nasal spray kit [id number]  Place 1 spray into the nose as needed (opioid overdose). [provider]  Active Self           Med Note [name]   Wed May 10, 2020 12:59 PM) Has both the nasal spray and injection  Naloxone HCl (NARCAN IJ) [id number]  Inject 1 Dose as directed as needed (opioid overdose). [provider]  Active Self           Med Note [name]   Wed May 10, 2020 12:59 PM)  Has both the nasal spray and injection  nitroGLYCERIN (NITROSTAT) 0.4 MG SL tablet [id number]  Place 1 tablet (0.4 mg total) under the tongue every 5 (five) minutes x 3 doses as needed for chest pain. [name], [name], [name]  Active   omeprazole (PRILOSEC) 40 MG capsule [id number] No Take 1 capsule (40 mg total) by mouth 2 (two) times daily.  Patient not taking: No sig reported   [name], MD Not Taking Active   omeprazole (PRILOSEC) 40 MG capsule [id number] Yes Take 1 capsule (40 mg total) by mouth 2 (two) times daily. [name], MD Taking Active   ondansetron [name]) 4 MG tablet [id number] Yes Take 1 tablet (4 mg total) by mouth every 8 (eight) hours as needed for nausea or vomiting. [name], MD Taking Active   ondansetron (ZOFRAN-ODT) 8 MG disintegrating tablet [id number] No Take 1 tablet (8 mg total) by mouth 2 (two) times daily.  Patient not taking: Reported  on 01/22/2021   [name], MD Not Taking Active   oxyCODONE (ROXICODONE) 5 MG immediate release tablet [id number] No Take 4 tablets (20 mg total) by mouth every 6 (six) hours as needed (pain).  Patient not taking: No sig reported   Judith Part, MD Not Taking Active   sucralfate (CARAFATE) 1 GM/10ML suspension [id number] Yes TAKE 10 MLS (1 G TOTAL) BY MOUTH 4 (FOUR) TIMES DAILY. [name], MD Taking Active   Tiotropium Bromide Monohydrate (SPIRIVA RESPIMAT) 2.5 MCG/ACT AERS [id number] Yes Inhale 1 puff into the lungs daily. Tania Ade Taking Active             Patient Active Problem List   Diagnosis Date Noted   Lumbar radiculopathy 05/23/2020   Sepsis ([place]) 10/19/2018   Severe sepsis ([place]) 10/19/2018   Cervical radiculopathy 02/12/2018   Chronic migraine 02/12/2018   Paresthesia 08/29/2017   Neck pain 08/29/2017   Daily headache 08/29/2017   Chronic active hepatitis ([place]) 07/29/2017   Neurogenic pain 07/31/2016   Chronic pain syndrome 06/05/2016   Carotid artery stenosis 02/27/2016   Chronic  constipation 02/27/2016   Chronic nausea 02/27/2016   Hypercholesterolemia 02/27/2016   Osteoarthritis 02/27/2016   PTSD (Munoz-traumatic stress disorder) 02/27/2016   TIA (transient ischemic attack) 02/27/2016   Long term current use of opiate analgesic 02/27/2016   Long term prescription opiate use 02/27/2016   Encounter for pain management consult 02/27/2016   Chronic hip pain (Location of Tertiary source of pain) (Bilateral) (L>R) 02/27/2016   Chronic knee pain (Bilateral) (L>R) 02/27/2016   Chronic shoulder pain (Bilateral) (L>R) 02/27/2016   Chronic sacroiliac joint pain (Bilateral) (L>R) 02/27/2016   Chronic low back pain (Location of Primary Source of Pain) (Bilateral) (L>R) 02/27/2016   Chronic lower extremity pain (Location of Secondary source of pain) (Bilateral) (L>R) 02/27/2016   Osteoarthritis of hip (Bilateral) (L>R) 02/27/2016   Chronic neck pain (posterior midline) (Bilateral) (L>R) 02/27/2016   Cervicogenic headache (Bilateral) (L>R) 02/27/2016   Occipital headache (Bilateral) (L>R) 02/27/2016   Chronic upper extremity pain (Bilateral) (L>R) 02/27/2016   Chronic Cervical radicular pain (Bilateral) (L>R) 02/27/2016   Chronic lumbar radicular pain (Right) (L5 dermatome) 02/27/2016   Lumbar facet syndrome (Bilateral) (L>R) 02/27/2016   Long term prescription benzodiazepine use 02/27/2016   Hypertension    Chronic obstructive pulmonary disease ([place]) 10/09/2015   GERD (gastroesophageal reflux disease) 10/09/2015   Non-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/2014 08/02/2015   Hyperlipidemia 08/02/2015   Costochondritis 08/02/2015   Drug abuse, 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   GAD (generalized anxiety disorder) 09/03/2014   Narcotic dependence ([place]) 09/03/2014   PVD (peripheral vascular disease) ([place]) 09/03/2014   Smoker 09/03/2014   Cigarette nicotine dependence with nicotine-induced disorder 07/08/2014   Anemia of chronic disease 03/19/2014   Narcotic abuse, continuous ([place]) 03/19/2014    Polysubstance abuse ([place]) 03/19/2014   MSSA (methicillin susceptible Staphylococcus aureus) septicemia ([place]) 03/07/2014   Hypomagnesemia 01/04/2014   Chronic cough 09/04/2011   Sleep apnea, obstructive 09/04/2011    Conditions to be addressed/monitored per PCP order:   chronic healthcare management needs,  -CAD, HTN, PVD, TIA, migraines, COPD, OSA, GERD, hepatitis, tobacco use, osteoarthritis, chronic pain, anxiety.  Care Plan : General Plan of Care (Adult)  Updates made by Sara Medicus, RN since 01/22/2021 12:00 AM     Problem: Health Promotion or Disease Self-Management (General Plan of Care)   Priority: High  Onset Date: 12/20/2020     Long-Range Goal: Self-Management Plan Developed  Start Date: 12/20/2020  Expected End Date: 03/22/2021  Recent Progress: Not on track  Priority: High  Note:    Current Barriers:  Knowledge Deficits related to short term plan for care coordination needs and long term plans for chronic disease management needs.  Patient with many chronic health conditions-CAD, HTN, PVD, TIA, migraines, COPD, OSA, GERD, hepatitis, tobacco use, osteoarthritis, chronic pain, anxiety. Nurse Case Manager Clinical Goal(s):  patient will work with care management team to address care coordination and chronic disease management needs related to Disease Management Educational Needs Care Coordination Medication Management and Education Medication Reconciliation Medication Assistance  Psychosocial Support Mental Health Counseling   Interventions:  Evaluation of current treatment plan  and patient's adherence to plan as established by provider. Advised patient to contact PCP regarding blood pressure. Update 01/22/21:  Patient has appointment with PCP 01/23/21. Reviewed medications with patient. Collaborated with pharmacy regarding medications.  Collaborated with SW regarding anxiety. Discussed plans with patient for ongoing care management follow up and provided patient with  direct contact information for care management team Advised patient, providing education and rationale, to monitor blood pressure daily and record, calling PCPfor findings outside established parameters.  Reviewed scheduled/upcoming provider appointments. Social Work referral for anxiety, PTSD. Update 01/22/21:  Patient has met with SW and continues to follow-[facility] referral made-patient to call back and schedule appointment. Pharmacy referral for medication review. Update 01/22/21:  Patient has met with Pharmacist and continues to follow. Patient given 1-800-QUITNOW for smoking cessation if interested. Update 01/22/21:  Patient states she is smoking 1/2 ppd now and trying to quit-has patches to use if she chooses. Self Care Activities:  Patient will self administer medications as prescribed Patient will attend all scheduled provider appointments Patient will call pharmacy for medication refills Patient will continue to perform ADL's independently Patient will call provider office for new concerns or questions Patient Goals:  In the next 30 days, patient will call [facility] back to schedule appointment for anxiety/PTSD. In the next 90 days, patient will attend all scheduled appointments. - Follow Up Plan: The patient has been provided with contact information for the care management team and has been advised to call with any health related questions or concerns.  The care management team will reach out to the patient again over the next 30 days.  Evidence-based guidance:   Review biopsychosocial determinants of health screens.  Review need for preventive screening based on age, sex, family history and health history.  Determine level of modifiable health risk.   Discuss identified risks.  Identify areas where behavior change may lead to improved health.  Promote healthy lifestyle.  Evoke change talk using open-ended questions, pros and cons, as well as looking forward.  Identify and manage  conditions or preconditions to reduce health risk.  Implement additional goals and interventions based on identified risk factors.    Follow Up:  Patient agrees to Care Plan and Follow-up.  Plan: The Managed Medicaid care management team will reach out to the patient again over the next 30 days. and The patient has been provided with contact information for the Managed Medicaid care management team and has been advised to call with any health related questions or concerns.  Date/time of next scheduled RN care management/care coordination outreach:  02/22/21 at 330.

## 2021-01-22 NOTE — Patient Instructions (Signed)
Hi Ms. Goga, thank you for speaking with me today.  Ms. Kutzer was given information about Medicaid Managed Care team care coordination services as a part of their [place] Complete Medicaid benefit. [name] verbally consented to engagement with the [facility] Managed Care team.   If you are experiencing a medical emergency, please call 911 or report to your local emergency department or urgent care.   If you have a non-emergency medical problem during routine business hours, please contact your provider's office and ask to speak with a nurse.   For questions related to your [place] Complete Medicaid health plan, please call: [phone]  If you would like to schedule transportation through your [place] Complete Medicaid plan, please call the following number at least 2 days in advance of your appointment: [phone].   Call the [place] at [phone], at any time, 24 hours a day, 7 days a week. If you are in danger or need immediate medical attention call 911.  If you would like help to quit smoking, call 1-800-QUIT-NOW 708-236-5768) OR Espaol: 1-855-Djelo-Ya (1-655-374-8270) o para ms informacin haga clic aqu or Text READY to 200-400 to register via text  [name] - following are the goals we discussed in your visit today:   Goals Addressed               This Visit's Progress                               Protect My Health        Timeframe:  Long-Range Goal Priority:  High Start Date:       12/20/20                      Expected End Date:    ongoing                   Follow Up Date:  02/22/21.   - schedule appointment for flu shot - schedule appointment for vaccines needed due to my age or health - schedule recommended health tests (blood work, mammogram, colonoscopy, pap test) - schedule and keep appointment for annual check-up   Update 01/22/21:  Patient has follow up appointment with PCP 01/23/21.   Why is this important?    Screening tests can find diseases early when they are easier to treat.  Your doctor or nurse will talk with you about which tests are important for you.  Getting shots for common diseases like the flu and shingles will help prevent them.     Patient verbalizes understanding of instructions provided today.   The Managed Medicaid care management team will reach out to the patient again over the next 30 days.  The  Patient has been provided with contact information for the Managed Medicaid care management team and has been advised to call with any health related questions or concerns.   [name] RN, BSN [place]  [facility] - Managed Medicaid High Risk [phone].   Following is a copy of your plan of care:  Patient Care Plan: General Plan of Care (Adult)     Problem Identified: Health Promotion or Disease Self-Management (General Plan of Care)   Priority: High  Onset Date: 12/20/2020     Long-Range Goal: Self-Management Plan Developed   Start Date: 12/20/2020  Expected End Date: 03/22/2021  Recent Progress: Not on track  Priority:  High  Note:    Current Barriers:  Knowledge Deficits related to short term plan for care coordination needs and long term plans for chronic disease management needs.  Patient with many chronic health conditions-CAD, HTN, PVD, TIA, migraines, COPD, OSA, GERD, hepatitis, tobacco use, osteoarthritis, chronic pain, anxiety. Nurse Case Manager Clinical Goal(s):  patient will work with care management team to address care coordination and chronic disease management needs related to Disease Management Educational Needs Care Coordination Medication Management and Education Medication Reconciliation Medication Assistance  Psychosocial Support Mental Health Counseling   Interventions:  Evaluation of current treatment plan  and patient's adherence to plan as established by provider. Advised patient to contact  PCP regarding blood pressure. Update 01/22/21:  Patient has appointment with PCP 01/23/21. Reviewed medications with patient. Collaborated with pharmacy regarding medications.  Collaborated with SW regarding anxiety. Discussed plans with patient for ongoing care management follow up and provided patient with direct contact information for care management team Advised patient, providing education and rationale, to monitor blood pressure daily and record, calling PCPfor findings outside established parameters.  Reviewed scheduled/upcoming provider appointments. Social Work referral for anxiety, PTSD. Update 01/22/21:  Patient has met with SW and continues to follow-[facility] referral made-patient to call back and schedule appointment. Pharmacy referral for medication review. Update 01/22/21:  Patient has met with Pharmacist and continues to follow. Patient given 1-800-QUITNOW for smoking cessation if interested. Update 01/22/21:  Patient states she is smoking 1/2 ppd now and trying to quit-has patches to use if she chooses. Self Care Activities:  Patient will self administer medications as prescribed Patient will attend all scheduled provider appointments Patient will call pharmacy for medication refills Patient will continue to perform ADL's independently Patient will call provider office for new concerns or questions Patient Goals:  In the next 30 days, patient will call [facility] back to schedule appointment for anxiety/PTSD. In the next 90 days, patient will attend all scheduled appointments. - Follow Up Plan: The patient has been provided with contact information for the care management team and has been advised to call with any health related questions or concerns.  The care management team will reach out to the patient again over the next 30 days.  Evidence-based guidance:   Review biopsychosocial determinants of health screens.  Review need for preventive screening based on age, sex, family  history and health history.  Determine level of modifiable health risk.   Discuss identified risks.  Identify areas where behavior change may lead to improved health.  Promote healthy lifestyle.  Evoke change talk using open-ended questions, pros and cons, as well as looking forward.  Identify and manage conditions or preconditions to reduce health risk.  Implement additional goals and interventions based on identified risk factors.

## 2021-01-23 ENCOUNTER — Ambulatory Visit (INDEPENDENT_AMBULATORY_CARE_PROVIDER_SITE_OTHER): Payer: Medicaid Other | Admitting: Family Medicine

## 2021-01-23 ENCOUNTER — Ambulatory Visit: Payer: Self-pay

## 2021-01-23 ENCOUNTER — Encounter: Payer: Self-pay | Admitting: Family Medicine

## 2021-01-23 ENCOUNTER — Other Ambulatory Visit: Payer: Self-pay

## 2021-01-23 VITALS — BP 131/72 | HR 59 | Temp 98.4°F | Wt 189.0 lb

## 2021-01-23 DIAGNOSIS — J449 Chronic obstructive pulmonary disease, unspecified: Secondary | ICD-10-CM | POA: Diagnosis not present

## 2021-01-23 DIAGNOSIS — I10 Essential (primary) hypertension: Secondary | ICD-10-CM

## 2021-01-23 DIAGNOSIS — E7800 Pure hypercholesterolemia, unspecified: Secondary | ICD-10-CM | POA: Diagnosis not present

## 2021-01-23 DIAGNOSIS — G8929 Other chronic pain: Secondary | ICD-10-CM

## 2021-01-23 DIAGNOSIS — G43909 Migraine, unspecified, not intractable, without status migrainosus: Secondary | ICD-10-CM

## 2021-01-23 DIAGNOSIS — K5909 Other constipation: Secondary | ICD-10-CM

## 2021-01-23 DIAGNOSIS — F4310 Post-traumatic stress disorder, unspecified: Secondary | ICD-10-CM

## 2021-01-23 DIAGNOSIS — M5416 Radiculopathy, lumbar region: Secondary | ICD-10-CM

## 2021-01-23 NOTE — Progress Notes (Signed)
Established patient visit   Patient: Sara Munoz   DOB: July 11, 1965   55 y.o. Female  MRN: [id number] Visit Date: 01/23/2021  Today's healthcare provider: Vernie Murders, [name]   No chief complaint on file.  Subjective    HPI  Patient states she is here because of anxiety, fatigue, insomnia and headaches.  However the appointment was scheduled to follow up on her hypertension.  Hypertension, follow-up  BP Readings from Last 3 Encounters:  01/23/21 131/72  12/08/20 (!) 181/110  11/16/20 134/84   Wt Readings from Last 3 Encounters:  01/23/21 189 lb (85.7 kg)  12/08/20 192 lb 14.4 oz (87.5 kg)  11/16/20 192 lb 12.8 oz (87.5 kg)     She was last seen for hypertension 10 months ago.  BP at that visit was as above. Management since that visit includes none.  She reports good compliance with treatment. She is not having side effects.   Use of agents associated with hypertension: none.   Outside blood pressures are checked but not recorded. Symptoms: No chest pain No chest pressure  No palpitations No syncope  No dyspnea No orthopnea  No paroxysmal nocturnal dyspnea No lower extremity edema   Pertinent labs: Lab Results  Component Value Date   CHOL 251 (H) 02/20/2018   HDL 46 02/20/2018   LDLCALC 161 (H) 02/20/2018   TRIG 220 (H) 02/20/2018   CHOLHDL 5.5 (H) 02/20/2018   Lab Results  Component Value Date   NA 138 05/16/2020   K 4.1 05/16/2020   CREATININE 0.75 05/16/2020   GFRNONAA >60 05/16/2020   GFRAA >60 11/17/2019   GLUCOSE 96 05/16/2020     The 10-year ASCVD risk score Mikey Bussing DC Jr., et al., 2013) is: 9.2%   ---------------------------------------------------------------------------------------------------   Past Medical History:  Diagnosis Date   Anxiety    Asthma    Atypical chest pain 08/02/2015   CAD (coronary artery disease)    Chronic back pain    COPD (chronic obstructive pulmonary disease) (HCC)    Coronary artery disease    a.  Mild-non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/2014   GERD (gastroesophageal reflux disease)    Hepatitis C    per patient, "took medicines for it and no longer has it"   Hypercholesteremia    Hypertension    Hypokalemia    Hypomagnesemia 01/04/2014   Liver disease    MI (myocardial infarction) [facility])    Nerve pain    per patient, in the lower back   Opiate use 02/27/2016   Osteoarthritis    Stroke [facility])    Past Surgical History:  Procedure Laterality Date   ANTERIOR CERVICAL DECOMP/DISCECTOMY FUSION N/A 03/30/2018   Procedure: Cervical six-seven Anterior cervical decompression/discectomy/fusion;  Surgeon: Judith Part, MD;  Location: [place];  Service: Neurosurgery;  Laterality: N/A;   ANTERIOR CERVICAL DECOMP/DISCECTOMY FUSION N/A 01/11/2019   Procedure: Cervical Five-Six Anterior cervical discectomy fusion,  Cervical Five to Cervical Seven anterior instrumented fusion;  Surgeon: Judith Part, MD;  Location: [place];  Service: Neurosurgery;  Laterality: N/A;  Cervical Five-Six Anterior cervical discectomy fusion,  Cervical Five to Cervical Seven anterior instrumented fusion   BACK SURGERY     CARDIAC CATHETERIZATION Left 02/22/2016   Procedure: Left Heart Cath and Coronary Angiography;  Surgeon: Yolonda Kida, MD;  Location: [place] CV LAB;  Service: Cardiovascular;  Laterality: Left;   CATARACT EXTRACTION W/ INTRAOCULAR LENS IMPLANT Right    COLONOSCOPY WITH PROPOFOL N/A 09/19/2016  Procedure: COLONOSCOPY WITH PROPOFOL;  Surgeon: [name], MD;  Location[facility] ENDOSCOPY;  Service: Endoscopy;  Laterality: N/A;   DIAGNOSTIC LAPAROSCOPY     ESOPHAGOGASTRODUODENOSCOPY (EGD) WITH PROPOFOL N/A 09/18/2017   Procedure: ESOPHAGOGASTRODUODENOSCOPY (EGD) WITH PROPOFOL;  Surgeon: [name], MD;  Location: [facility] ENDOSCOPY;  Service: Gastroenterology;  Laterality: N/A;   ESOPHAGOGASTRODUODENOSCOPY (EGD) WITH PROPOFOL N/A 08/10/2019   Procedure: ESOPHAGOGASTRODUODENOSCOPY (EGD) WITH PROPOFOL;   Surgeon: [name], MD;  Location: [facility] ENDOSCOPY;  Service: Endoscopy;  Laterality: N/A;   FRACTURE SURGERY     hemorroids     NASAL SINUS SURGERY     ORIF FEMUR FRACTURE     ORIF TIBIA & FIBULA FRACTURES     OVARY SURGERY     TRANSFORAMINAL LUMBAR INTERBODY FUSION W/ MIS 1 LEVEL Right 05/23/2020   Procedure: Right Lumbar Five Sacral One Minimally invasive transforaminal lumbar interbody fusion;  Surgeon: Judith Part, MD;  Location: [place];  Service: Neurosurgery;  Laterality: Right;  Right Lumbar Five Sacral One Minimally invasive transforaminal lumbar interbody fusion   Social History   Tobacco Use   Smoking status: Every Day    Packs/day: 1.00    Years: 35.00    Pack years: 35.00    Types: Cigarettes   Smokeless tobacco: Never   Tobacco comments:    07/2019- 1.5 a day  Vaping Use   Vaping Use: Former  Substance Use Topics   Alcohol use: Not Currently    Alcohol/week: 0.0 standard drinks   Drug use: Not Currently    Types: Marijuana   Family Status  Relation Name Status   Mother  Deceased   Father  Deceased       MVA   Brother  Alive   MGM  Deceased   MGF  Deceased   PGM  Deceased   PGF  Deceased   Brother  Alive   Brother  Alive   Mat Uncle  Deceased   Mat Aunt  (Not Specified)   Allergies  Allergen Reactions   Flexeril [Cyclobenzaprine] Hives and Swelling    Facial/lip swelling      Levofloxacin Hives and Swelling   Phenergan [Promethazine Hcl] Other (See Comments)    Agitation.    Toradol [Ketorolac Tromethamine] Swelling and Other (See Comments)    Facial/tongue swelling    Tramadol Hives, Swelling and Other (See Comments)    Lip swelling    Zoloft [Sertraline Hcl] Swelling    Tongue swelling         Medications:  Outpatient Medications Prior to Visit  Medication Sig   ondansetron (ZOFRAN) 4 MG tablet Take 1 tablet (4 mg total) by mouth every 8 (eight) hours as needed for nausea or vomiting.   ondansetron (ZOFRAN-ODT) 8 MG  disintegrating tablet Take 1 tablet (8 mg total) by mouth 2 (two) times daily.   albuterol (PROVENTIL) (2.5 MG/3ML) 0.083% nebulizer solution Take 3 mLs (2.5 mg total) by nebulization every 6 (six) hours as needed for wheezing or shortness of breath. (Patient taking differently: Take 2.5 mg by nebulization 2 (two) times daily.)   albuterol (VENTOLIN HFA) 108 (90 Base) MCG/ACT inhaler Inhale 2 puffs into the lungs every 6 (six) hours as needed for wheezing or shortness of breath.   amLODipine (NORVASC) 5 MG tablet Take 1 tablet (5 mg total) by mouth daily.   atorvastatin (LIPITOR) 20 MG tablet Take 1 tablet (20 mg total) by mouth daily.   budesonide-formoterol (SYMBICORT) 160-4.5 MCG/ACT inhaler Inhale 2 puffs into the lungs  2 (two) times daily.   buprenorphine-naloxone (SUBOXONE) 8-2 mg SUBL SL tablet Place 1 tablet under the tongue daily.   cyclobenzaprine (FLEXERIL) 10 MG tablet Take 10 mg by mouth 2 (two) times daily as needed for muscle spasms.   docusate sodium (COLACE) 100 MG capsule Take 200 mg by mouth daily as needed for moderate constipation.    DULoxetine (CYMBALTA) 60 MG capsule Take 1 capsule (60 mg total) by mouth daily. Please call [phone] to schedule appt or may request future refills from PCP. (Patient not taking: No sig reported)   gabapentin (NEURONTIN) 600 MG tablet Take 600 mg by mouth 4 (four) times daily.   hydrOXYzine (ATARAX/VISTARIL) 50 MG tablet Take 1 tablet (50 mg total) by mouth 3 (three) times daily as needed.   linaclotide (LINZESS) 290 MCG CAPS capsule Take 1 capsule (290 mcg total) by mouth daily before breakfast.   lisinopril (ZESTRIL) 40 MG tablet Take 1 tablet (40 mg total) by mouth daily.   meloxicam (MOBIC) 15 MG tablet Take 15 mg by mouth daily.   metaxalone (SKELAXIN) 800 MG tablet Take 800 mg by mouth 3 (three) times daily.   methocarbamol (ROBAXIN) 750 MG tablet Take 1 tablet (750 mg total) by mouth every 6 (six) hours as needed for muscle spasms.    methylPREDNISolone (MEDROL DOSEPAK) 4 MG TBPK tablet Take Tapered dose as directed   metoprolol tartrate (LOPRESSOR) 25 MG tablet Take 1 tablet (25 mg total) by mouth 2 (two) times daily.   naloxone (NARCAN) 4 MG/0.1ML LIQD nasal spray kit Place 1 spray into the nose as needed (opioid overdose).   Naloxone HCl (NARCAN IJ) Inject 1 Dose as directed as needed (opioid overdose).   nitroGLYCERIN (NITROSTAT) 0.4 MG SL tablet Place 1 tablet (0.4 mg total) under the tongue every 5 (five) minutes x 3 doses as needed for chest pain.   omeprazole (PRILOSEC) 40 MG capsule Take 1 capsule (40 mg total) by mouth 2 (two) times daily. (Patient not taking: No sig reported)   omeprazole (PRILOSEC) 40 MG capsule Take 1 capsule (40 mg total) by mouth 2 (two) times daily.   oxyCODONE (ROXICODONE) 5 MG immediate release tablet Take 4 tablets (20 mg total) by mouth every 6 (six) hours as needed (pain).   sucralfate (CARAFATE) 1 GM/10ML suspension TAKE 10 MLS (1 G TOTAL) BY MOUTH 4 (FOUR) TIMES DAILY.   Tiotropium Bromide Monohydrate (SPIRIVA RESPIMAT) 2.5 MCG/ACT AERS Inhale 1 puff into the lungs daily.   [DISCONTINUED] buPROPion (WELLBUTRIN SR) 150 MG 12 hr tablet TAKE 1 TABLET BY MOUTH DAILY FOR 3 DAYS, THEN 1 TABLET TWICE DAILY. STOP SMOKING 14 DAYS AFTER STARTING MEDICATION (Patient not taking: No sig reported)   No facility-administered medications prior to visit.    Review of Systems      Objective    BP 131/72 (BP Location: Right Arm, Patient Position: Sitting, Cuff Size: Normal)   Pulse (!) 59   Temp 98.4 F (36.9 C) (Oral)   Wt 189 lb (85.7 kg)   LMP 12/16/2017 (Approximate)   SpO2 99%   BMI 32.44 kg/m  BP Readings from Last 3 Encounters:  05/19/21 109/65  05/07/21 (!) 147/79  01/23/21 131/72   Wt Readings from Last 3 Encounters:  05/14/21 200 lb (90.7 kg)  05/07/21 199 lb 4.8 oz (90.4 kg)  01/23/21 189 lb (85.7 kg)       Physical Exam    No results found for any visits on 01/23/21.   Assessment & Plan  1. Hypercholesterolemia  - CBC with Differential/Platelet - Comprehensive metabolic panel - Lipid panel - TSH  2. Essential hypertension  - CBC with Differential/Platelet - Comprehensive metabolic panel - Lipid panel - TSH  3. Chronic obstructive pulmonary disease, unspecified COPD type ([place])  - CBC with Differential/Platelet  4. Chronic lumbar radicular pain (Right) (L5 dermatome)   5. Migraine without status migrainosus, not intractable, unspecified migraine type   6. Chronic constipation   7. PTSD (post-traumatic stress disorder)    No follow-ups on file.      I, Climmie Buelow, [name], have reviewed all documentation for this visit. The documentation on 03/18/21 for the exam, diagnosis, procedures, and orders are all accurate and complete.  Am the supervising physician for Fannie Knee, PA and I am signing off on this unfinished note with no further clinical information Wilhemena Durie., MD   Vernie Murders, [name]  [facility] [phone] (phone) [phone] (fax)  [place]

## 2021-01-26 ENCOUNTER — Ambulatory Visit: Payer: Self-pay

## 2021-01-29 ENCOUNTER — Ambulatory Visit: Payer: Medicaid Other | Admitting: Neurology

## 2021-01-30 ENCOUNTER — Other Ambulatory Visit: Payer: Self-pay | Admitting: Licensed Clinical Social Worker

## 2021-01-30 NOTE — Patient Instructions (Signed)
Visit Information  Sara Munoz was given information about Medicaid Managed Care team care coordination services as a part of their [place] Complete Medicaid benefit. Sara Munoz verbally consented to engagement with the [facility] Managed Care team.   If you are experiencing a medical emergency, please call 911 or report to your local emergency department or urgent care.   If you have a non-emergency medical problem during routine business hours, please contact your provider's office and ask to speak with a nurse.   For questions related to your [place] Medicaid health plan, please call: [phone]  If you would like to schedule transportation through your [place] Medicaid plan, please call the following number at least 2 days in advance of your appointment: [phone].   Call the [place] at [phone], at any time, 24 hours a day, 7 days a week. If you are in danger or need immediate medical attention call 911.  If you would like help to quit smoking, call 1-800-QUIT-NOW 5742473818) OR Espaol: 1-855-Djelo-Ya HD:1601594) o para ms informacin haga clic aqu or Text READY to 200-400 to register via text  Sara Munoz - following are the goals we discussed in your visit today:   Goals Addressed             This Visit's Progress    Manage My Emotions       Timeframe:  Long-Range Goal Priority:  High Start Date:    01/04/21                         Expected End Date:   ongoing                    Follow Up Date 02/19/21   - begin personal counseling - call and visit an old friend - check out volunteer opportunities - join a support group - laugh; watch a funny movie or comedian - learn and use visualization or guided imagery - perform a random act of kindness - practice relaxation or meditation daily - start or continue a personal journal - talk about feelings with a friend, family or spiritual advisor - practice  positive thinking and self-talk    Why is this important?   When you are stressed, down or upset, your body reacts too.  For example, your blood pressure may get higher; you may have a headache or stomachache.  When your emotions get the best of you, your body's ability to fight off cold and flu gets weak.  These steps will help you manage your emotions.     Notes:        [name], BSW, MSW, [employer] Managed Medicaid [email or web address] Phone: [phone]

## 2021-01-30 NOTE — Patient Outreach (Signed)
Medicaid Managed Care Social Work Note  01/30/2021 Name:  Sara Munoz MRN:  [id number] DOB:  01/21/1966  Ravenna R Zavada is an 55 y.o. year old female who is a primary patient of Pollak, Adriana M, [name].  The Medicaid Managed Care Coordination team was consulted for assistance with:  Mental Health Counseling and Resources  Ms. Bumpass was given information about Medicaid Managed CareCoordination services today. Sakinah R Mccarn agreed to services and verbal consent obtained.  Engaged with patient  for by telephone forfollow up visit in response to referral for case management and/or care coordination services.   Assessments/Interventions:  Review of past medical history, allergies, medications, health status, including review of consultants reports, laboratory and other test data, was performed as part of comprehensive evaluation and provision of chronic care management services.  SDOH: (Social Determinant of Health) assessments and interventions performed: SDOH Interventions    Flowsheet Row Most Recent Value  SDOH Interventions   Stress Interventions Provide Counseling  Depression Interventions/Treatment  Referral to Psychiatry       Advanced Directives Status:  See Care Plan for related entries.  Care Plan                 Allergies  Allergen Reactions   Flexeril [Cyclobenzaprine] Hives and Swelling    Facial/lip swelling      Levofloxacin Hives and Swelling   Phenergan [Promethazine Hcl] Other (See Comments)    Agitation.    Toradol [Ketorolac Tromethamine] Swelling and Other (See Comments)    Facial/tongue swelling    Tramadol Hives, Swelling and Other (See Comments)    Lip swelling    Zoloft [Sertraline Hcl] Swelling    Tongue swelling       Medications Reviewed Today     Reviewed by [name], Dennis E, [name] (Physician Assistant) on 01/23/21 at 1547  Med List Status: <None>   Medication Order Taking? Sig Documenting Provider Last Dose Status Informant  albuterol  (PROVENTIL) (2.5 MG/3ML) 0.083% nebulizer solution [id number]  Take 3 mLs (2.5 mg total) by nebulization every 6 (six) hours as needed for wheezing or shortness of breath.  Patient taking differently: Take 2.5 mg by nebulization 2 (two) times daily.   Gonzalez, Carmen L, MD  Active   albuterol (VENTOLIN HFA) 108 (90 Base) MCG/ACT inhaler [id number]  Inhale 2 puffs into the lungs every 6 (six) hours as needed for wheezing or shortness of breath. [name], Dennis E, [name]  Active   amLODipine (NORVASC) 5 MG tablet [id number]  Take 1 tablet (5 mg total) by mouth daily. [name], Dennis E, [name]  Active   atorvastatin (LIPITOR) 20 MG tablet [id number]  Take 1 tablet (20 mg total) by mouth daily. [name], Dennis E, [name]  Active   budesonide-formoterol (SYMBICORT) 160-4.5 MCG/ACT inhaler [id number]  Inhale 2 puffs into the lungs 2 (two) times daily. [name], Dennis E, [name]  Active   buprenorphine-naloxone (SUBOXONE) 8-2 mg SUBL SL tablet [id number]  Place 1 tablet under the tongue daily. [provider]  Active Self  cyclobenzaprine (FLEXERIL) 10 MG tablet [id number]  Take 10 mg by mouth 2 (two) times daily as needed for muscle spasms. [provider]  Active Self  docusate sodium (COLACE) 100 MG capsule [id number]  Take 200 mg by mouth daily as needed for moderate constipation.  [provider]  Active Self  DULoxetine (CYMBALTA) 60 MG capsule [id number]  Take 1 capsule (60 mg total) by mouth daily. Please call [phone] to schedule appt or may request future refills   from PCP.  Patient not taking: No sig reported   Yan, Yijun, MD  Active   gabapentin (NEURONTIN) 600 MG tablet [id number]  Take 600 mg by mouth 4 (four) times daily. [provider]  Active Self  hydrOXYzine (ATARAX/VISTARIL) 50 MG tablet [id number]  Take 1 tablet (50 mg total) by mouth 3 (three) times daily as needed. Smith, Ronald K, [name]  Consider Medication Status and Discontinue   linaclotide (LINZESS) 290 MCG CAPS  capsule [id number]  Take 1 capsule (290 mcg total) by mouth daily before breakfast. Anna, Kiran, MD  Active   lisinopril (ZESTRIL) 40 MG tablet [id number]  Take 1 tablet (40 mg total) by mouth daily. [name], Dennis E, [name]  Active   meloxicam (MOBIC) 15 MG tablet [id number]  Take 15 mg by mouth daily. [provider]  Active Self  metaxalone (SKELAXIN) 800 MG tablet [id number]  Take 800 mg by mouth 3 (three) times daily. [provider]  Consider Medication Status and Discontinue   methocarbamol (ROBAXIN) 750 MG tablet [id number]  Take 1 tablet (750 mg total) by mouth every 6 (six) hours as needed for muscle spasms. Smith, Ronald K, [name]  Consider Medication Status and Discontinue   methylPREDNISolone (MEDROL DOSEPAK) 4 MG TBPK tablet [id number]  Take Tapered dose as directed Smith, Ronald K, [name]  Consider Medication Status and Discontinue   metoprolol tartrate (LOPRESSOR) 25 MG tablet [id number]  Take 1 tablet (25 mg total) by mouth 2 (two) times daily. [name], Dennis E, [name]  Active   naloxone (NARCAN) 4 MG/0.1ML LIQD nasal spray kit [id number]  Place 1 spray into the nose as needed (opioid overdose). [provider]  Active Self           Med Note (FISHER, KYLE A   Wed May 10, 2020 12:59 PM) Has both the nasal spray and injection  Naloxone HCl (NARCAN IJ) [id number]  Inject 1 Dose as directed as needed (opioid overdose). [provider]  Active Self           Med Note (FISHER, KYLE A   Wed May 10, 2020 12:59 PM) Has both the nasal spray and injection  nitroGLYCERIN (NITROSTAT) 0.4 MG SL tablet [id number]  Place 1 tablet (0.4 mg total) under the tongue every 5 (five) minutes x 3 doses as needed for chest pain. [name], Dennis E, [name]  Active   omeprazole (PRILOSEC) 40 MG capsule [id number]  Take 1 capsule (40 mg total) by mouth 2 (two) times daily.  Patient not taking: No sig reported   Anna, Kiran, MD  Active   omeprazole (PRILOSEC) 40 MG capsule [id number]  Take 1  capsule (40 mg total) by mouth 2 (two) times daily. Anna, Kiran, MD  Active   ondansetron (ZOFRAN) 4 MG tablet [id number] Yes Take 1 tablet (4 mg total) by mouth every 8 (eight) hours as needed for nausea or vomiting. Anna, Kiran, MD Taking Active   ondansetron (ZOFRAN-ODT) 8 MG disintegrating tablet [id number] Yes Take 1 tablet (8 mg total) by mouth 2 (two) times daily. Anna, Kiran, MD Taking Active   oxyCODONE (ROXICODONE) 5 MG immediate release tablet [id number]  Take 4 tablets (20 mg total) by mouth every 6 (six) hours as needed (pain). Ostergard, Thomas A, MD  Consider Medication Status and Discontinue   sucralfate (CARAFATE) 1 GM/10ML suspension [id number]  TAKE 10 MLS (1 G TOTAL) BY MOUTH 4 (FOUR) TIMES DAILY. Anna, Kiran, MD  Active   Tiotropium Bromide Monohydrate (SPIRIVA RESPIMAT) 2.5 MCG/ACT AERS [id number]    Inhale 1 puff into the lungs daily. Tania Ade  Active             Patient Active Problem List   Diagnosis Date Noted   Lumbar radiculopathy 05/23/2020   Sepsis ([place]) 10/19/2018   Severe sepsis ([place]) 10/19/2018   Cervical radiculopathy 02/12/2018   Chronic migraine 02/12/2018   Paresthesia 08/29/2017   Neck pain 08/29/2017   Daily headache 08/29/2017   Chronic active hepatitis ([place]) 07/29/2017   Neurogenic pain 07/31/2016   Chronic pain syndrome 06/05/2016   Carotid artery stenosis 02/27/2016   Chronic constipation 02/27/2016   Chronic nausea 02/27/2016   Hypercholesterolemia 02/27/2016   Osteoarthritis 02/27/2016   PTSD (post-traumatic stress disorder) 02/27/2016   TIA (transient ischemic attack) 02/27/2016   Long term current use of opiate analgesic 02/27/2016   Long term prescription opiate use 02/27/2016   Encounter for pain management consult 02/27/2016   Chronic hip pain (Location of Tertiary source of pain) (Bilateral) (L>R) 02/27/2016   Chronic knee pain (Bilateral) (L>R) 02/27/2016   Chronic shoulder pain (Bilateral) (L>R) 02/27/2016   Chronic  sacroiliac joint pain (Bilateral) (L>R) 02/27/2016   Chronic low back pain (Location of Primary Source of Pain) (Bilateral) (L>R) 02/27/2016   Chronic lower extremity pain (Location of Secondary source of pain) (Bilateral) (L>R) 02/27/2016   Osteoarthritis of hip (Bilateral) (L>R) 02/27/2016   Chronic neck pain (posterior midline) (Bilateral) (L>R) 02/27/2016   Cervicogenic headache (Bilateral) (L>R) 02/27/2016   Occipital headache (Bilateral) (L>R) 02/27/2016   Chronic upper extremity pain (Bilateral) (L>R) 02/27/2016   Chronic Cervical radicular pain (Bilateral) (L>R) 02/27/2016   Chronic lumbar radicular pain (Right) (L5 dermatome) 02/27/2016   Lumbar facet syndrome (Bilateral) (L>R) 02/27/2016   Long term prescription benzodiazepine use 02/27/2016   Hypertension    Chronic obstructive pulmonary disease ([place]) 10/09/2015   GERD (gastroesophageal reflux disease) 10/09/2015   Non-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/2014 08/02/2015   Hyperlipidemia 08/02/2015   Costochondritis 08/02/2015   Drug abuse, 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   GAD (generalized anxiety disorder) 09/03/2014   Narcotic dependence ([place]) 09/03/2014   PVD (peripheral vascular disease) ([place]) 09/03/2014   Smoker 09/03/2014   Cigarette nicotine dependence with nicotine-induced disorder 07/08/2014   Anemia of chronic disease 03/19/2014   Narcotic abuse, continuous ([place]) 03/19/2014   Polysubstance abuse ([place]) 03/19/2014   MSSA (methicillin susceptible Staphylococcus aureus) septicemia ([place]) 03/07/2014   Hypomagnesemia 01/04/2014   Chronic cough 09/04/2011   Sleep apnea, obstructive 09/04/2011    Conditions to be addressed/monitored per PCP order:  Anxiety and Depression  Care Plan : LCSW Plan of Care  Updates made by Greg Cutter, LCSW since 01/30/2021 12:00 AM     Problem: Anxiety Identification (Anxiety)      Long-Range Goal: Anxiety Symptoms Identified   Start Date: 01/04/2021  Priority: High  Note:   Timeframe:   Long-Range Goal Priority:  High Start Date:    01/04/21                         Expected End Date:   ongoing                    Follow Up Date 02/19/21   Current barriers:   Chronic Mental Health needs related to anxiety, stress and depression Limited social support, Mental Health Concerns , and Social Isolation Needs Support, Education, and Care Coordination in order to meet unmet mental health needs. Clinical Goal(s): patient will work  with SW to address concerns related to increasing coping skills to help managed mood patient will work with [employer] platform to address needs related to finding a mental health psychiatrist    Clinical Interventions:  Assessed patient's previous and current treatment, coping skills, support system and barriers to care  Patient reports that she is in need of only medication management. She moved from [facility] to [facility]. Patient reports that she already has a Social worker in [place] at [employer] that she has built Museum/gallery exhibitions officer with. Patient is agreeable to [facility] referral in order to find a psychiatrist for medication management. Referral placed on 01/04/21. Patient has her first initial appointment with a psychiatric nurse practitioner on 02/02/21 at 1:00 pm. This appointment is virtual and she has already received the link to join tele health session for 02/02/21.   Review various resources, discussed options and provided patient information about Discussed several options for long term counseling based on need and insurance Depression screen reviewed , Solution-Focused Strategies, Mindfulness or Relaxation Training, Active listening / Reflection utilized , Emotional Supportive Provided, Brief CBT , and Suicidal Ideation/Homicidal Ideation assessed: ; Patient interviewed and appropriate assessments performed Discussed plans with patient for ongoing care management follow up and provided patient with direct contact information for  care management team Assisted patient/caregiver with obtaining information about health plan benefits Provided education and assistance to client regarding Advanced Directives. Discussed several options for long term counseling based on need and insurance.  Inter-disciplinary care team collaboration (see longitudinal plan of care) Patient reports that she is still having issues with insomnia and is not sleeping well at night.  LCSW provided education on healthy sleep hygiene and what that looks like. LCSW encouraged patient to implement a night time routine into her schedule that works best for her and that she is able to maintain. Advised patient to implement deep breathing/grounding/meditation/self-care exercises into her nightly routine to combat racing thoughts at night. LCSW encouraged patient to wake up at the same time each day, make her sleeping environment comfortable, exercise when able, to limit naps and to not eat or drink anything right before bed.   Patient Goals/Self-Care Activities: Over the next 120 days - barriers to treatment adherence identified - complementary therapy use encouraged - counseling provided - depression screen reviewed - self-awareness of emotional triggers encouraged - strategies to manage emotional triggers promoted - avoid negative self-talk - develop a personal safety plan - develop a plan to deal with triggers like holidays, anniversaries - exercise at least 2 to 3 times per week - have a plan for how to handle bad days - journal feelings and what helps to feel better or worse - spend time or talk with others at least 2 to 3 times per week - spend time or talk with others every day - watch for early signs of feeling worse - write in journal every day - begin personal counseling - call and visit an old friend - check out volunteer opportunities - join a support group - laugh; watch a funny movie or comedian - learn and use visualization or guided  imagery - perform a random act of kindness - practice relaxation or meditation daily - start or continue a personal journal - talk about feelings with a friend, family or spiritual advisor - practice positive thinking and self-talk I have placed a referral with [facility] to assist with connecting you with a mental health provider. they will contact you once a provider is located. -  Update appointment scheduled for 02/02/21.        Follow up:  Patient agrees to Care Plan and Follow-up.  Plan: The Managed Medicaid care management team will reach out to the patient again over the next 30 days.  Date/time of next scheduled Social Work care management/care coordination outreach:  02/19/21  Brooke Joyce, BSW, MSW, LCSW Managed Medicaid LCSW [place]  [facility] [email or web address] Phone: [phone]   

## 2021-02-01 DIAGNOSIS — Z6832 Body mass index (BMI) 32.0-32.9, adult: Secondary | ICD-10-CM | POA: Diagnosis not present

## 2021-02-01 DIAGNOSIS — M5416 Radiculopathy, lumbar region: Secondary | ICD-10-CM | POA: Diagnosis not present

## 2021-02-01 DIAGNOSIS — I10 Essential (primary) hypertension: Secondary | ICD-10-CM | POA: Diagnosis not present

## 2021-02-01 DIAGNOSIS — M5412 Radiculopathy, cervical region: Secondary | ICD-10-CM | POA: Diagnosis not present

## 2021-02-02 DIAGNOSIS — F419 Anxiety disorder, unspecified: Secondary | ICD-10-CM | POA: Diagnosis not present

## 2021-02-02 DIAGNOSIS — G4700 Insomnia, unspecified: Secondary | ICD-10-CM | POA: Diagnosis not present

## 2021-02-12 ENCOUNTER — Other Ambulatory Visit: Payer: Self-pay | Admitting: Family Medicine

## 2021-02-12 DIAGNOSIS — J432 Centrilobular emphysema: Secondary | ICD-10-CM

## 2021-02-12 DIAGNOSIS — E785 Hyperlipidemia, unspecified: Secondary | ICD-10-CM

## 2021-02-12 DIAGNOSIS — I10 Essential (primary) hypertension: Secondary | ICD-10-CM

## 2021-02-12 NOTE — Telephone Encounter (Signed)
Notes to clinic:  Review for refills Patient had labs on 01/23/2021 that have not been done    Requested Prescriptions  Pending Prescriptions Disp Refills   [place] 2.5 MCG/ACT AERS [Pharmacy Med Name: Spiriva Respimat 2.5 mcg/actuation solution for inhalation] 4 g 0    Sig: INHALE 1 PUFF BY MOUTH INTO LUNGS DAILY     Pulmonology:  Anticholinergic Agents Passed - 02/12/2021 11:32 AM      Passed - Valid encounter within last 12 months    Recent Outpatient Visits           2 weeks ago Hypercholesterolemia   [place], [name], [name]   10 months ago Encounter for tobacco use cessation counseling   [employer], [place], [place]   1 year ago Chronic nausea   [facility] [place], Wendee Beavers, [place]   1 year ago Pre-op examination   [facility] [place], Adriana M, [place]   1 year ago Hypercholesterolemia   [facility] [place] Floor               PROAIR HFA 108 [phone] Base) MCG/ACT inhaler [Pharmacy Med Name: ProAir HFA 90 mcg/actuation aerosol inhaler] 8.5 g 0    Sig: INHALE TWO PUFFS BY MOUTH INTO LUNGS EVERY 6 HOURS AS NEEDED FOR SHORTNESS OF BREATH OR wheezing     Pulmonology:  Beta Agonists Failed - 02/12/2021 11:32 AM      Failed - One inhaler should last at least one month. If the patient is requesting refills earlier, contact the patient to check for uncontrolled symptoms.      Passed - Valid encounter within last 12 months    Recent Outpatient Visits           2 weeks ago Hypercholesterolemia   [place], [name]   10 months ago Encounter for tobacco use cessation counseling   [employer], [place], [place]   1 year ago Chronic nausea   [facility] Carles Collet M, [place]   1 year ago Pre-op examination   [facility] Carles Collet M, [place]   1 year ago Hypercholesterolemia    [place], [place], [name]               amLODipine (NORVASC) 5 MG tablet [Pharmacy Med Name: amlodipine 5 mg tablet] 30 tablet 0    Sig: TAKE ONE TABLET BY MOUTH ONCE DAILY     Cardiovascular:  Calcium Channel Blockers Passed - 02/12/2021 11:32 AM      Passed - Last BP in normal range    BP Readings from Last 1 Encounters:  01/23/21 131/72          Passed - Valid encounter within last 6 months    Recent Outpatient Visits           2 weeks ago Hypercholesterolemia   [place], [name]   10 months ago Encounter for tobacco use cessation counseling   [facility] [place], [place], [place]   1 year ago Chronic nausea   [facility] [place], Wendee Beavers, [place]   1 year ago Pre-op examination   [facility] Carles Collet M, [place]   1 year ago Hypercholesterolemia   [place], [place], [name]               atorvastatin (LIPITOR) 20 MG tablet [employer]  Med Name: atorvastatin 20 mg tablet] 30 tablet 0    Sig: TAKE ONE TABLET BY MOUTH EVERY EVENING     Cardiovascular:  Antilipid - Statins Failed - 02/12/2021 11:32 AM      Failed - Total Cholesterol in normal range and within 360 days    Cholesterol, Total  Date Value Ref Range Status  02/20/2018 251 (H) 100 - 199 mg/dL Final          Failed - LDL in normal range and within 360 days    LDL Calculated  Date Value Ref Range Status  02/20/2018 161 (H) 0 - 99 mg/dL Final          Failed - HDL in normal range and within 360 days    HDL  Date Value Ref Range Status  02/20/2018 46 >39 mg/dL Final          Failed - Triglycerides in normal range and within 360 days    Triglycerides  Date Value Ref Range Status  02/20/2018 220 (H) 0 - 149 mg/dL Final          Passed - Patient is not pregnant      Passed - Valid encounter within last 12 months    Recent Outpatient Visits           2 weeks ago  Hypercholesterolemia   [employer] [facility], [name], [name]   10 months ago Encounter for tobacco use cessation counseling   [employer], [place], [place]   1 year ago Chronic nausea   [facility] Carles Collet M, [place]   1 year ago Pre-op examination   [facility] Carles Collet M, [place]   1 year ago Hypercholesterolemia   [place], [place], [name]               lisinopril (ZESTRIL) 40 MG tablet [Pharmacy Med Name: lisinopril 40 mg tablet] 30 tablet 0    Sig: TAKE ONE TABLET BY MOUTH EVERY EVENING     Cardiovascular:  ACE Inhibitors Failed - 02/12/2021 11:32 AM      Failed - Cr in normal range and within 180 days    Creatinine, Ser  Date Value Ref Range Status  05/16/2020 0.75 0.44 - 1.00 mg/dL Final          Failed - K in normal range and within 180 days    Potassium  Date Value Ref Range Status  05/16/2020 4.1 3.5 - 5.1 mmol/L Final          Passed - Patient is not pregnant      Passed - Last BP in normal range    BP Readings from Last 1 Encounters:  01/23/21 131/72          Passed - Valid encounter within last 6 months    Recent Outpatient Visits           2 weeks ago Hypercholesterolemia   [place], [name], [name]   10 months ago Encounter for tobacco use cessation counseling   [employer], [place], [place]   1 year ago Chronic nausea   [place], Wendee Beavers, [place]   1 year ago Pre-op examination   [facility] [place], Wendee Beavers, [place]   1 year ago Hypercholesterolemia   [place], [place], [place]               SYMBICORT 160-4.5 MCG/ACT  inhaler [Pharmacy Med Name: Symbicort 160 mcg-4.5 mcg/actuation HFA aerosol inhaler] 10.2 g 0    Sig: INHALE TWO PUFFS BY MOUTH INTO LUNGS TWICE DAILY     Pulmonology:  Combination Products Passed - 02/12/2021 11:32 AM       Passed - Valid encounter within last 12 months    Recent Outpatient Visits           2 weeks ago Hypercholesterolemia   [place], [name]   10 months ago Encounter for tobacco use cessation counseling   [employer], [place], [place]   1 year ago Chronic nausea   [facility] [place], Wendee Beavers, [place]   1 year ago Pre-op examination   [facility] [name], [place]   1 year ago Hypercholesterolemia   [place], Wendee Beavers, [place]

## 2021-02-13 ENCOUNTER — Other Ambulatory Visit: Payer: Self-pay

## 2021-02-13 NOTE — Patient Outreach (Signed)
Medicaid Managed Care    Pharmacy Note  02/13/2021 Name: Sara Munoz MRN: [id number] DOB: April 06, 1966  Sara Munoz is a 55 y.o. year old female who is a primary care patient of [name], [place]. The [facility] Managed Care Coordination team was consulted for assistance with disease management and care coordination needs.    Engaged with patient Engaged with patient by telephone for initial visit in response to referral for case management and/or care coordination services.  Sara Munoz was given information about Managed Medicaid Care Coordination team services today. Sara Munoz agreed to services and verbal consent obtained.   Objective:  Lab Results  Component Value Date   CREATININE 0.75 05/16/2020   CREATININE 0.84 11/17/2019   CREATININE 0.92 08/11/2019    Lab Results  Component Value Date   HGBA1C 5.9 (H) 08/11/2019       Component Value Date/Time   CHOL 251 (H) 02/20/2018 1230   TRIG 220 (H) 02/20/2018 1230   HDL 46 02/20/2018 1230   CHOLHDL 5.5 (H) 02/20/2018 1230   CHOLHDL 5.3 08/02/2015 0606   VLDL 24 08/02/2015 0606   LDLCALC 161 (H) 02/20/2018 1230    Other: (TSH, CBC, Vit D, etc.)  Clinical ASCVD: Yes  The 10-year ASCVD risk score Mikey Bussing DC Jr., et al., 2013) is: 9.2%   Values used to calculate the score:     Age: 24 years     Sex: Female     Is Non-Hispanic African American: No     Diabetic: No     Tobacco smoker: Yes     Systolic Blood Pressure: 762 mmHg     Is BP treated: Yes     HDL Cholesterol: 46 mg/dL     Total Cholesterol: 251 mg/dL    Other: (CHADS2VASc if Afib, PHQ9 if depression, MMRC or CAT for COPD, ACT, DEXA)  BP Readings from Last 3 Encounters:  01/23/21 131/72  12/08/20 (!) 181/110  11/16/20 134/84    Assessment/Interventions: Review of patient past medical history, allergies, medications, health status, including review of consultants reports, laboratory and other test data, was performed as part of comprehensive  evaluation and provision of chronic care management services.   HTN BP Readings from Last 3 Encounters:  01/23/21 131/72  12/08/20 (!) 181/110  11/16/20 134/84  Amlodipine 70m Lisinopril 454mMetoprolol 2579mID Tartrate Extensive time spent on counseling patient on blood pressure goal and impact of each antihypertensive medication on their blood pressure and risk reduction for CV disease.  Used analogies to explain the need for multiple antihypertensive medications to achieve BP goals and that it is often a silent disease with no symptoms.  Plan: At goal,  patient stable/ symptoms controlled   Cholesterol Atorvastatin 49m28mver 15 minutes spent counseling patient on role of hyperlipidemia on heart disease and the impact of each lipid subclass with emphasis on LDL and heart disease risk.  Explained role of statins in prevention of CV disease complications and actual risk for adverse effects with statin treatment.  Counseled patient on genetic component placing them at risk for hyperlipidemia. Plan: Will try to ask PCP to increase to max dose at future visit, current PCP has left practice and patient has no active PCP so unable to do so today  Pulm Symbicort 160/4.5 Tiotropium Plan: At goal,  patient stable/ symptoms controlled   Mental Health Depression screen PHQ [facility] 01/11/2021 04/03/2020 02/20/2018  Decreased Interest '1 2 1  ' Down, Depressed, Hopeless 2 0 0  PHQ - 2 Score '3 2 1  ' Altered sleeping '3 3 3  ' Tired, decreased energy '2 2 2  ' Change in appetite '2 2 1  ' Feeling bad or failure about yourself  1 0 0  Trouble concentrating 2 0 1  Moving slowly or fidgety/restless 3 0 1  Suicidal thoughts 0 0 0  PHQ-9 Score '16 9 9  ' Difficult doing work/chores Very difficult Somewhat difficult Somewhat difficult  Some recent data might be hidden  Hydroxyzine 90m Tried/Failed: Bupropion 1515m12hr Duloxetine 6071muly 2022: Patient could use therapy but she stopped taking meds years ago  (Unable to get refills) and wasn't able to start back up. She doesn't have an active PCP (See above) so unable to ask new PCP to re-start Duloxetine  Pain 01/11/21:  With Meds: 6/10 Without Meds: 10/10 Meloxicam Cyclobenzaprine Suboxone (Causing constipation) Gabapentin 600m61mD Tried/Failed: Oxycodone 5mg 46mhocarbamol Plan: At goal,  patient stable/ symptoms controlled   GERD (Hx of Bleeding) -Omeprazole 40mg 25mPlan: At goal,  patient stable/ symptoms controlled   IBS Plecanatide (Trulance) July 2022: Unable to get due to requiring a PA. Contacted office to get PA completed  Misc: Patient finally has a PCP and is able to get meds. BurlinScience writerw PCP     SDOH (Social Determinants of Health) assessments and interventions performed:  SDOH Interventions    Flowsheet Row Most Recent Value  SDOH Interventions   Financial Strain Interventions Other (Comment)  [[facility] at [facility]  Allergen Reactions   Flexeril [Cyclobenzaprine] Hives and Swelling    Facial/lip swelling      Levofloxacin Hives and Swelling   Phenergan [Promethazine Hcl] Other (See Comments)    Agitation.    Toradol [Ketorolac Tromethamine] Swelling and Other (See Comments)    Facial/tongue swelling    Tramadol Hives, Swelling and Other (See Comments)    Lip swelling    Zoloft [Sertraline Hcl] Swelling    Tongue swelling       Medications Reviewed Today     Reviewed by ChrismMargo Common (Physician Assistant) on 01/23/21 at 1547  SadlerStatus: <None>   Medication Order Taking? Sig Documenting Provider Last Dose Status Informant  albuterol (PROVENTIL) (2.5 MG/3ML) 0.083% nebulizer solution [id number] 3 mLs (2.5 mg total) by nebulization every 6 (six) hours as needed for wheezing or shortness of breath.  Patient taking differently: Take 2.5 mg by nebulization 2 (two) times daily.   GonzalTyler PitaActive   albuterol  (VENTOLIN HFA) 108 (90 Base) MCG/ACT inhaler [id number] 2 puffs into the lungs every 6 (six) hours as needed for wheezing or shortness of breath. [name], DennisVickki Muff  Active   amLODipine (NORVASC) 5 MG tablet [id number] 1 tablet (5 mg total) by mouth daily. [name], DennisVickki Muff  Active   atorvastatin (LIPITOR) 20 MG tablet [id number] 1 tablet (20 mg total) by mouth daily. [name], DennisDriscilla Grammesve   budesonide-formoterol (SYMBISkypark Surgery Center LLC4.5 MCG/ACT inhaler [id number] 2 puffs into the lungs 2 (two) times daily. [name], DennisVickki Muff  Active   buprenorphine-naloxone (SUBOXONE) 8-2 mg SUBL SL tablet [id number] 1 tablet under the tongue daily. [provider]  Active Self  cyclobenzaprine (FLEXERIL) 10 MG tablet [id number] 10 mg by mouth 2 (two) times daily as needed for muscle spasms. [provider]  Active Self  docusate sodium (COLACE) 100 MG capsule [id number]  Take 200 mg by mouth daily as needed for moderate constipation.  [provider]  Active Self  DULoxetine (CYMBALTA) 60 MG capsule [id number]  Take 1 capsule (60 mg total) by mouth daily. Please call [phone] to schedule appt or may request future refills from PCP.  Patient not taking: No sig reported   [name], MD  Active   gabapentin (NEURONTIN) 600 MG tablet [id number]  Take 600 mg by mouth 4 (four) times daily. [provider]  Active Self  hydrOXYzine (ATARAX/VISTARIL) 50 MG tablet [id number]  Take 1 tablet (50 mg total) by mouth 3 (three) times daily as needed. Sable Feil, [name]  Consider Medication Status and Discontinue   linaclotide (LINZESS) 290 MCG CAPS capsule [id number]  Take 1 capsule (290 mcg total) by mouth daily before breakfast. [name], MD  Active   lisinopril (ZESTRIL) 40 MG tablet [id number]  Take 1 tablet (40 mg total) by mouth daily. [name], [name], [name]  Active   meloxicam (MOBIC) 15 MG tablet [id number]  Take 15 mg by mouth  daily. [provider]  Active Self  metaxalone (SKELAXIN) 800 MG tablet [id number]  Take 800 mg by mouth 3 (three) times daily. [provider]  Consider Medication Status and Discontinue   methocarbamol (ROBAXIN) 750 MG tablet [id number]  Take 1 tablet (750 mg total) by mouth every 6 (six) hours as needed for muscle spasms. Sable Feil, [name]  Consider Medication Status and Discontinue   methylPREDNISolone (MEDROL DOSEPAK) 4 MG TBPK tablet [id number]  Take Tapered dose as directed Sable Feil, [name]  Consider Medication Status and Discontinue   metoprolol tartrate (LOPRESSOR) 25 MG tablet [id number]  Take 1 tablet (25 mg total) by mouth 2 (two) times daily. [name], [name], [name]  Active   naloxone [employer]) 4 MG/0.1ML LIQD nasal spray kit [id number]  Place 1 spray into the nose as needed (opioid overdose). [provider]  Active Self           Med Note [name]   Wed May 10, 2020 12:59 PM) Has both the nasal spray and injection  Naloxone HCl (NARCAN IJ) [id number]  Inject 1 Dose as directed as needed (opioid overdose). [provider]  Active Self           Med Note [name]   Wed May 10, 2020 12:59 PM) Has both the nasal spray and injection  nitroGLYCERIN (NITROSTAT) 0.4 MG SL tablet [id number]  Place 1 tablet (0.4 mg total) under the tongue every 5 (five) minutes x 3 doses as needed for chest pain. [name], [name], [name]  Active   omeprazole (PRILOSEC) 40 MG capsule [id number]  Take 1 capsule (40 mg total) by mouth 2 (two) times daily.  Patient not taking: No sig reported   [name], MD  Active   omeprazole (PRILOSEC) 40 MG capsule [id number]  Take 1 capsule (40 mg total) by mouth 2 (two) times daily. [name], MD  Active   ondansetron [facility]) 4 MG tablet [id number] Yes Take 1 tablet (4 mg total) by mouth every 8 (eight) hours as needed for nausea or vomiting. [name], MD Taking Active   ondansetron (ZOFRAN-ODT) 8 MG disintegrating  tablet [id number] Yes Take 1 tablet (8 mg total) by mouth 2 (two) times daily. [name], MD Taking Active   oxyCODONE (ROXICODONE) 5 MG immediate release tablet [id number]  Take 4 tablets (20 mg  total) by mouth every 6 (six) hours as needed (pain). Judith Part, MD  Consider Medication Status and Discontinue   sucralfate (CARAFATE) 1 GM/10ML suspension [id number]  TAKE 10 MLS (1 G TOTAL) BY MOUTH 4 (FOUR) TIMES DAILY. [name], MD  Active   Tiotropium Bromide Monohydrate (SPIRIVA RESPIMAT) 2.5 MCG/ACT AERS [id number]  Inhale 1 puff into the lungs daily. Tania Ade  Active             Patient Active Problem List   Diagnosis Date Noted   Lumbar radiculopathy 05/23/2020   Sepsis ([place]) 10/19/2018   Severe sepsis ([place]) 10/19/2018   Cervical radiculopathy 02/12/2018   Chronic migraine 02/12/2018   Paresthesia 08/29/2017   Neck pain 08/29/2017   Daily headache 08/29/2017   Chronic active hepatitis ([place]) 07/29/2017   Neurogenic pain 07/31/2016   Chronic pain syndrome 06/05/2016   Carotid artery stenosis 02/27/2016   Chronic constipation 02/27/2016   Chronic nausea 02/27/2016   Hypercholesterolemia 02/27/2016   Osteoarthritis 02/27/2016   PTSD (post-traumatic stress disorder) 02/27/2016   TIA (transient ischemic attack) 02/27/2016   Long term current use of opiate analgesic 02/27/2016   Long term prescription opiate use 02/27/2016   Encounter for pain management consult 02/27/2016   Chronic hip pain (Location of Tertiary source of pain) (Bilateral) (L>R) 02/27/2016   Chronic knee pain (Bilateral) (L>R) 02/27/2016   Chronic shoulder pain (Bilateral) (L>R) 02/27/2016   Chronic sacroiliac joint pain (Bilateral) (L>R) 02/27/2016   Chronic low back pain (Location of Primary Source of Pain) (Bilateral) (L>R) 02/27/2016   Chronic lower extremity pain (Location of Secondary source of pain) (Bilateral) (L>R) 02/27/2016   Osteoarthritis of hip (Bilateral) (L>R) 02/27/2016    Chronic neck pain (posterior midline) (Bilateral) (L>R) 02/27/2016   Cervicogenic headache (Bilateral) (L>R) 02/27/2016   Occipital headache (Bilateral) (L>R) 02/27/2016   Chronic upper extremity pain (Bilateral) (L>R) 02/27/2016   Chronic Cervical radicular pain (Bilateral) (L>R) 02/27/2016   Chronic lumbar radicular pain (Right) (L5 dermatome) 02/27/2016   Lumbar facet syndrome (Bilateral) (L>R) 02/27/2016   Long term prescription benzodiazepine use 02/27/2016   Hypertension    Chronic obstructive pulmonary disease ([place]) 10/09/2015   GERD (gastroesophageal reflux disease) 10/09/2015   Non-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/2014 08/02/2015   Hyperlipidemia 08/02/2015   Costochondritis 08/02/2015   Drug abuse, 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[place]) 09/03/2014   GAD (generalized anxiety disorder) 09/03/2014   Narcotic dependence ([place]) 09/03/2014   PVD (peripheral vascular disease) ([place]) 09/03/2014   Smoker 09/03/2014   Cigarette nicotine dependence with nicotine-induced disorder 07/08/2014   Anemia of chronic disease 03/19/2014   Narcotic abuse, continuous ([place]) 03/19/2014   Polysubstance abuse ([place]) 03/19/2014   MSSA (methicillin susceptible Staphylococcus aureus) septicemia ([place]) 03/07/2014   Hypomagnesemia 01/04/2014   Chronic cough 09/04/2011   Sleep apnea, obstructive 09/04/2011    Conditions to be addressed/monitored:  Medication Access  Care Plan : Medication Management  Updates made by Lane Hacker, [place] since 01/11/2021 12:00 AM     Problem: Health Promotion or Disease Self-Management (General Plan of Care)      Goal: Medication Coordination   Note:   Current Barriers:  Unable to self administer medications as prescribed Does not contact provider office for questions/concerns   Pharmacist Clinical Goal(s):  Over the next 30 days, patient will contact provider office for questions/concerns as evidenced notation of same in electronic health record through collaboration with PharmD and  provider.    Interventions: Inter-disciplinary care team collaboration (see longitudinal plan of  care) Comprehensive medication review performed; medication list updated in electronic medical record  Health Maintenance:  Patient Goals/Self-Care Activities Over the next 30 days, patient will:  - collaborate with provider on medication access solutions  Follow Up Plan: The patient has been provided with contact information for the care management team and has been advised to call with any health related questions or concerns.      Task: Mutually Develop and Royce Macadamia Achievement of Patient Goals   Note:   Care Management Activities:    - verbalization of feelings encouraged    Notes:        Medication Assistance:  Doesn't have car so is unable to get refills easily Plan: Verbal consent obtained for [facility] enhanced pharmacy services (medication synchronization, adherence packaging, delivery coordination). A medication sync plan was created to allow patient to get all medications delivered once every 30 to 90 days per patient preference. Patient understands they have freedom to choose pharmacy and clinical pharmacist will coordinate care between all prescribers and [facility].   Follow up: Agree   Plan: The patient has been provided with contact information for the care management team and has been advised to call with any health related questions or concerns.   [place] Constable, Pharm.D., Managed Medicaid Pharmacist - [phone]

## 2021-02-13 NOTE — Patient Instructions (Signed)
Visit Information  Sara Munoz was given information about Medicaid Managed Care team care coordination services as a part of their [place] Complete Medicaid benefit. Sara Munoz verbally consented to engagement with the [facility] Managed Care team.   If you are experiencing a medical emergency, please call 911 or report to your local emergency department or urgent care.   If you have a non-emergency medical problem during routine business hours, please contact your provider's office and ask to speak with a nurse.   For questions related to your [place] Complete Medicaid health plan, please call: [phone]  If you would like to schedule transportation through your [place] Complete Medicaid plan, please call the following number at least 2 days in advance of your appointment: [phone].   Call the [facility] at [phone], at any time, 24 hours a day, 7 days a week. If you are in danger or need immediate medical attention call 911.  If you would like help to quit smoking, call 1-800-QUIT-NOW 4437762279) OR Espaol: 1-855-Djelo-Ya (9-242-683-4196) o para ms informacin haga clic aqu or Text READY to 200-400 to register via text  Sara Munoz - following are the goals we discussed in your visit today:   Goals Addressed   None     Please see education materials related to HTN provided as print materials.   The patient verbalized understanding of instructions provided today and declined a print copy of patient instruction materials.   The  Patient                                              has been provided with contact information for the Managed Medicaid care management team and has been advised to call with any health related questions or concerns.   [place] Constable, Pharm.D., Managed Medicaid Pharmacist [phone]   Following is a copy of your plan of care:  Patient Care Plan: General Plan of Care (Adult)     Problem Identified: Health  Promotion or Disease Self-Management (General Plan of Care)   Priority: High  Onset Date: 12/20/2020     Long-Range Goal: Self-Management Plan Developed   Start Date: 12/20/2020  Expected End Date: 03/22/2021  Recent Progress: Not on track  Priority: High  Note:     Current Barriers:  Knowledge Deficits related to short term plan for care coordination needs and long term plans for chronic disease management needs.  Patient with many chronic health conditions-CAD, HTN, PVD, TIA, migraines, COPD, OSA, GERD, hepatitis, tobacco use, osteoarthritis, chronic pain, anxiety. Nurse Case Manager Clinical Goal(s):  patient will work with care management team to address care coordination and chronic disease management needs related to Disease Management Educational Needs Care Coordination Medication Management and Education Medication Reconciliation Medication Assistance  Psychosocial Support Mental Health Counseling   Interventions:  Evaluation of current treatment plan  and patient's adherence to plan as established by provider. Advised patient to contact PCP regarding blood pressure. Update 01/22/21:  Patient has appointment with PCP 01/23/21. Reviewed medications with patient. Collaborated with pharmacy regarding medications.  Collaborated with SW regarding anxiety. Discussed plans with patient for ongoing care management follow up and provided patient with direct contact information for care management team Advised patient, providing education and rationale, to monitor blood pressure daily and record, calling PCPfor findings outside established parameters.  Reviewed scheduled/upcoming provider appointments. Social Work referral  for anxiety, PTSD. Update 01/22/21:  Patient has met with SW and continues to follow-[facility] referral made-patient to call back and schedule appointment. Pharmacy referral for medication review. Update 01/22/21:  Patient has met with Pharmacist and continues to  follow. Patient given 1-800-QUITNOW for smoking cessation if interested. Update 01/22/21:  Patient states she is smoking 1/2 ppd now and trying to quit-has patches to use if she chooses. Self Care Activities:  Patient will self administer medications as prescribed Patient will attend all scheduled provider appointments Patient will call pharmacy for medication refills Patient will continue to perform ADL's independently Patient will call provider office for new concerns or questions Patient Goals:  In the next 30 days, patient will call [facility] back to schedule appointment for anxiety/PTSD. In the next 90 days, patient will attend all scheduled appointments. - Follow Up Plan: The patient has been provided with contact information for the care management team and has been advised to call with any health related questions or concerns.  The care management team will reach out to the patient again over the next 30 days.  Evidence-based guidance:   Review biopsychosocial determinants of health screens.  Review need for preventive screening based on age, sex, family history and health history.  Determine level of modifiable health risk.   Discuss identified risks.  Identify areas where behavior change may lead to improved health.  Promote healthy lifestyle.  Evoke change talk using open-ended questions, pros and cons, as well as looking forward.  Identify and manage conditions or preconditions to reduce health risk.  Implement additional goals and interventions based on identified risk factors.     Task: Mutually Develop and Royce Macadamia Achievement of Patient Goals   Note:   Care Management Activities:    - verbalization of feelings encouraged    Notes:     Patient Care Plan: LCSW Plan of Care     Problem Identified: Anxiety Identification (Anxiety)      Long-Range Goal: Anxiety Symptoms Identified   Start Date: 01/04/2021  Priority: High  Note:   Timeframe:  Long-Range  Goal Priority:  High Start Date:    01/04/21                         Expected End Date:   ongoing                    Follow Up Date 02/19/21   Current barriers:   Chronic Mental Health needs related to anxiety, stress and depression Limited social support, Mental Health Concerns , and Social Isolation Needs Support, Education, and Care Coordination in order to meet unmet mental health needs. Clinical Goal(s): patient will work with SW to address concerns related to increasing coping skills to help managed mood patient will work with [place] to address needs related to finding a mental health psychiatrist    Clinical Interventions:  Assessed patient's previous and current treatment, coping skills, support system and barriers to care  Patient reports that she is in need of only medication management. She moved from [facility] to [facility]. Patient reports that she already has a Social worker in [place] at [employer] that she has built Museum/gallery exhibitions officer with. Patient is agreeable to [facility] referral in order to find a psychiatrist for medication management. Referral placed on 01/04/21. Patient has her first initial appointment with a psychiatric nurse practitioner on 02/02/21 at 1:00 pm. This appointment is virtual and she has already received  the link to join tele health session for 02/02/21.   Review various resources, discussed options and provided patient information about Discussed several options for long term counseling based on need and insurance Depression screen reviewed , Solution-Focused Strategies, Mindfulness or Relaxation Training, Active listening / Reflection utilized , Emotional Supportive Provided, Brief CBT , and Suicidal Ideation/Homicidal Ideation assessed: ; Patient interviewed and appropriate assessments performed Discussed plans with patient for ongoing care management follow up and provided patient with direct contact information for care  management team Assisted patient/caregiver with obtaining information about health plan benefits Provided education and assistance to client regarding Advanced Directives. Discussed several options for long term counseling based on need and insurance.  Inter-disciplinary care team collaboration (see longitudinal plan of care) Patient reports that she is still having issues with insomnia and is not sleeping well at night.  LCSW provided education on healthy sleep hygiene and what that looks like. LCSW encouraged patient to implement a night time routine into her schedule that works best for her and that she is able to maintain. Advised patient to implement deep breathing/grounding/meditation/self-care exercises into her nightly routine to combat racing thoughts at night. LCSW encouraged patient to wake up at the same time each day, make her sleeping environment comfortable, exercise when able, to limit naps and to not eat or drink anything right before bed.   Patient Goals/Self-Care Activities: Over the next 120 days - barriers to treatment adherence identified - complementary therapy use encouraged - counseling provided - depression screen reviewed - self-awareness of emotional triggers encouraged - strategies to manage emotional triggers promoted - avoid negative self-talk - develop a personal safety plan - develop a plan to deal with triggers like holidays, anniversaries - exercise at least 2 to 3 times per week - have a plan for how to handle bad days - journal feelings and what helps to feel better or worse - spend time or talk with others at least 2 to 3 times per week - spend time or talk with others every day - watch for early signs of feeling worse - write in journal every day - begin personal counseling - call and visit an old friend - check out volunteer opportunities - join a support group - laugh; watch a funny movie or comedian - learn and use visualization or guided  imagery - perform a random act of kindness - practice relaxation or meditation daily - start or continue a personal journal - talk about feelings with a friend, family or spiritual advisor - practice positive thinking and self-talk I have placed a referral with [facility] to assist with connecting you with a mental health provider. they will contact you once a provider is located. -Update appointment scheduled for 02/02/21.       Patient Care Plan: Medication Management     Problem Identified: Health Promotion or Disease Self-Management (General Plan of Care)      Goal: Medication Coordination   Note:   Current Barriers:  Unable to self administer medications as prescribed Does not contact provider office for questions/concerns   Pharmacist Clinical Goal(s):  Over the next 30 days, patient will contact provider office for questions/concerns as evidenced notation of same in electronic health record through collaboration with PharmD and provider.    Interventions: Inter-disciplinary care team collaboration (see longitudinal plan of care) Comprehensive medication review performed; medication list updated in electronic medical record  Health Maintenance:  Patient Goals/Self-Care Activities Over the next 30 days, patient will:  - collaborate  with provider on medication access solutions  Follow Up Plan: The patient has been provided with contact information for the care management team and has been advised to call with any health related questions or concerns.      Task: Mutually Develop and Royce Macadamia Achievement of Patient Goals   Note:   Care Management Activities:    - verbalization of feelings encouraged    Notes:

## 2021-02-15 DIAGNOSIS — Z79891 Long term (current) use of opiate analgesic: Secondary | ICD-10-CM | POA: Diagnosis not present

## 2021-02-15 DIAGNOSIS — M25511 Pain in right shoulder: Secondary | ICD-10-CM | POA: Diagnosis not present

## 2021-02-15 DIAGNOSIS — M79661 Pain in right lower leg: Secondary | ICD-10-CM | POA: Diagnosis not present

## 2021-02-15 DIAGNOSIS — M79621 Pain in right upper arm: Secondary | ICD-10-CM | POA: Diagnosis not present

## 2021-02-15 DIAGNOSIS — M545 Low back pain, unspecified: Secondary | ICD-10-CM | POA: Diagnosis not present

## 2021-02-15 DIAGNOSIS — M79622 Pain in left upper arm: Secondary | ICD-10-CM | POA: Diagnosis not present

## 2021-02-15 DIAGNOSIS — M542 Cervicalgia: Secondary | ICD-10-CM | POA: Diagnosis not present

## 2021-02-15 DIAGNOSIS — G894 Chronic pain syndrome: Secondary | ICD-10-CM | POA: Diagnosis not present

## 2021-02-15 DIAGNOSIS — M25512 Pain in left shoulder: Secondary | ICD-10-CM | POA: Diagnosis not present

## 2021-02-15 DIAGNOSIS — M79662 Pain in left lower leg: Secondary | ICD-10-CM | POA: Diagnosis not present

## 2021-02-15 DIAGNOSIS — M5136 Other intervertebral disc degeneration, lumbar region: Secondary | ICD-10-CM | POA: Diagnosis not present

## 2021-02-19 ENCOUNTER — Other Ambulatory Visit: Payer: Self-pay | Admitting: Licensed Clinical Social Worker

## 2021-02-19 NOTE — Patient Outreach (Signed)
Medicaid Managed Care Social Work Note  02/19/2021 Name:  Sara Munoz MRN:  [id number] DOB:  21-Sep-1965  Sara Munoz is an 55 y.o. year old female who is Munoz primary patient of [name], [place].  The Medicaid Managed Care Coordination team was consulted for assistance with:  [place] and Resources  [name] was given information about Medicaid Managed Care Coordination team services today. [name] Patient agreed to services and verbal consent obtained.  Engaged with patient  for by telephone forfollow up visit in response to referral for case management and/or care coordination services.   Assessments/Interventions:  Review of past medical history, allergies, medications, health status, including review of consultants reports, laboratory and other test data, was performed as part of comprehensive evaluation and provision of chronic care management services.  SDOH: (Social Determinant of Health) assessments and interventions performed: SDOH Interventions    Flowsheet Row Most Recent Value  SDOH Interventions   Stress Interventions Provide Counseling  Social Connections Interventions Intervention Not Indicated  Depression Interventions/Treatment  Referral to Psychiatry       Advanced Directives Status:  See Care Plan for related entries.  Care Plan                 Allergies  Allergen Reactions   Flexeril [Cyclobenzaprine] Hives and Swelling    Facial/lip swelling      Levofloxacin Hives and Swelling   Phenergan [Promethazine Hcl] Other (See Comments)    Agitation.    Toradol [Ketorolac Tromethamine] Swelling and Other (See Comments)    Facial/tongue swelling    Tramadol Hives, Swelling and Other (See Comments)    Lip swelling    Zoloft [Sertraline Hcl] Swelling    Tongue swelling       Medications Reviewed Today     Reviewed by Margo Common, [name] (Physician Assistant) on 01/23/21 at [place] List Status: <None>   Medication  Order Taking? Sig Documenting Provider Last Dose Status Informant  albuterol (PROVENTIL) (2.5 MG/3ML) 0.083% nebulizer solution [id number]  Take 3 mLs (2.5 mg total) by nebulization every 6 (six) hours as needed for wheezing or shortness of breath.  Patient taking differently: Take 2.5 mg by nebulization 2 (two) times daily.   Sara Pita, MD  Active   albuterol (VENTOLIN HFA) 108 (90 Base) MCG/ACT inhaler [id number]  Inhale 2 puffs into the lungs every 6 (six) hours as needed for wheezing or shortness of breath. [name], Sara Muff, [name]  Active   amLODipine (NORVASC) 5 MG tablet [id number]  Take 1 tablet (5 mg total) by mouth daily. [name], Sara Muff, [name]  Active   atorvastatin (LIPITOR) 20 MG tablet [id number]  Take 1 tablet (20 mg total) by mouth daily. [name], Driscilla Grammes  Active   budesonide-formoterol [facility]) 160-4.5 MCG/ACT inhaler [id number]  Inhale 2 puffs into the lungs 2 (two) times daily. [name], Sara Muff, [name]  Active   buprenorphine-naloxone (SUBOXONE) 8-2 mg SUBL SL tablet [id number]  Place 1 tablet under the tongue daily. [provider]  Active Self  cyclobenzaprine (FLEXERIL) 10 MG tablet [id number]  Take 10 mg by mouth 2 (two) times daily as needed for muscle spasms. [provider]  Active Self  docusate sodium (COLACE) 100 MG capsule [id number]  Take 200 mg by mouth daily as needed for moderate constipation.  [provider]  Active Self  DULoxetine (CYMBALTA) 60 MG capsule [id number]  Take 1 capsule (60 mg total) by mouth daily. Please  call [phone] to schedule appt or may request future refills from PCP.  Patient not taking: No sig reported   [name], MD  Active   gabapentin (NEURONTIN) 600 MG tablet [id number]  Take 600 mg by mouth 4 (four) times daily. [provider]  Active Self  hydrOXYzine (ATARAX/VISTARIL) 50 MG tablet [id number]  Take 1 tablet (50 mg total) by mouth 3 (three) times daily as needed. Sable Feil, [name]   Consider Medication Status and Discontinue   linaclotide (LINZESS) 290 MCG CAPS capsule [id number]  Take 1 capsule (290 mcg total) by mouth daily before breakfast. [name], MD  Active   lisinopril (ZESTRIL) 40 MG tablet [id number]  Take 1 tablet (40 mg total) by mouth daily. [name], Sara Muff, [name]  Active   meloxicam (MOBIC) 15 MG tablet [id number]  Take 15 mg by mouth daily. [provider]  Active Self  metaxalone (SKELAXIN) 800 MG tablet [id number]  Take 800 mg by mouth 3 (three) times daily. [provider]  Consider Medication Status and Discontinue   methocarbamol (ROBAXIN) 750 MG tablet [id number]  Take 1 tablet (750 mg total) by mouth every 6 (six) hours as needed for muscle spasms. Sable Feil, [name]  Consider Medication Status and Discontinue   methylPREDNISolone (MEDROL DOSEPAK) 4 MG TBPK tablet [id number]  Take Tapered dose as directed Sable Feil, [name]  Consider Medication Status and Discontinue   metoprolol tartrate (LOPRESSOR) 25 MG tablet [id number]  Take 1 tablet (25 mg total) by mouth 2 (two) times daily. [name], Sara Muff, [name]  Active   naloxone [facility]) 4 MG/0.1ML LIQD nasal spray kit [id number]  Place 1 spray into the nose as needed (opioid overdose). [provider]  Active Self           Med Note Sara Munoz   Wed May 10, 2020 12:59 PM) Has both the nasal spray and injection  Naloxone HCl (NARCAN IJ) [id number]  Inject 1 Dose as directed as needed (opioid overdose). [provider]  Active Self           Med Note Sara Munoz   Wed May 10, 2020 12:59 PM) Has both the nasal spray and injection  nitroGLYCERIN (NITROSTAT) 0.4 MG SL tablet [id number]  Place 1 tablet (0.4 mg total) under the tongue every 5 (five) minutes x 3 doses as needed for chest pain. [name], Sara Muff, [name]  Active   omeprazole (PRILOSEC) 40 MG capsule [id number]  Take 1 capsule (40 mg total) by mouth 2 (two) times daily.  Patient not taking: No sig reported    [name], MD  Active   omeprazole (PRILOSEC) 40 MG capsule [id number]  Take 1 capsule (40 mg total) by mouth 2 (two) times daily. [name], MD  Active   ondansetron [facility]) 4 MG tablet [id number] Yes Take 1 tablet (4 mg total) by mouth every 8 (eight) hours as needed for nausea or vomiting. [name], MD Taking Active   ondansetron (ZOFRAN-ODT) 8 MG disintegrating tablet [id number] Yes Take 1 tablet (8 mg total) by mouth 2 (two) times daily. [name], MD Taking Active   oxyCODONE (ROXICODONE) 5 MG immediate release tablet [id number]  Take 4 tablets (20 mg total) by mouth every 6 (six) hours as needed (pain). Judith Part, MD  Consider Medication Status and Discontinue   sucralfate (CARAFATE) 1 GM/10ML suspension [id number]  TAKE 10 MLS (1 G TOTAL) BY MOUTH 4 (FOUR) TIMES DAILY. [name], MD  Active  Tiotropium Bromide Monohydrate (SPIRIVA RESPIMAT) 2.5 MCG/ACT AERS [id number]  Inhale 1 puff into the lungs daily. Tania Ade  Active             Patient Active Problem List   Diagnosis Date Noted   Lumbar radiculopathy 05/23/2020   Sepsis ([place]) 10/19/2018   Severe sepsis ([place]) 10/19/2018   Cervical radiculopathy 02/12/2018   Chronic migraine 02/12/2018   Paresthesia 08/29/2017   Neck pain 08/29/2017   Daily headache 08/29/2017   Chronic active hepatitis ([place]) 07/29/2017   Neurogenic pain 07/31/2016   Chronic pain syndrome 06/05/2016   Carotid artery stenosis 02/27/2016   Chronic constipation 02/27/2016   Chronic nausea 02/27/2016   Hypercholesterolemia 02/27/2016   Osteoarthritis 02/27/2016   PTSD (post-traumatic stress disorder) 02/27/2016   TIA (transient ischemic attack) 02/27/2016   Long term current use of opiate analgesic 02/27/2016   Long term prescription opiate use 02/27/2016   Encounter for pain management consult 02/27/2016   Chronic hip pain (Location of Tertiary source of pain) (Bilateral) (L>R) 02/27/2016   Chronic knee pain (Bilateral)  (L>R) 02/27/2016   Chronic shoulder pain (Bilateral) (L>R) 02/27/2016   Chronic sacroiliac joint pain (Bilateral) (L>R) 02/27/2016   Chronic low back pain (Location of Primary Source of Pain) (Bilateral) (L>R) 02/27/2016   Chronic lower extremity pain (Location of Secondary source of pain) (Bilateral) (L>R) 02/27/2016   Osteoarthritis of hip (Bilateral) (L>R) 02/27/2016   Chronic neck pain (posterior midline) (Bilateral) (L>R) 02/27/2016   Cervicogenic headache (Bilateral) (L>R) 02/27/2016   Occipital headache (Bilateral) (L>R) 02/27/2016   Chronic upper extremity pain (Bilateral) (L>R) 02/27/2016   Chronic Cervical radicular pain (Bilateral) (L>R) 02/27/2016   Chronic lumbar radicular pain (Right) (L5 dermatome) 02/27/2016   Lumbar facet syndrome (Bilateral) (L>R) 02/27/2016   Long term prescription benzodiazepine use 02/27/2016   Hypertension    Chronic obstructive pulmonary disease ([place]) 10/09/2015   GERD (gastroesophageal reflux disease) 10/09/2015   Non-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/2014 08/02/2015   Hyperlipidemia 08/02/2015   Costochondritis 08/02/2015   Drug abuse, 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[place]) 09/03/2014   GAD (generalized anxiety disorder) 09/03/2014   Narcotic dependence ([place]) 09/03/2014   PVD (peripheral vascular disease) ([place]) 09/03/2014   Smoker 09/03/2014   Cigarette nicotine dependence with nicotine-induced disorder 07/08/2014   Anemia of chronic disease 03/19/2014   Narcotic abuse, continuous ([place]) 03/19/2014   Polysubstance abuse ([place]) 03/19/2014   MSSA (methicillin susceptible Staphylococcus aureus) septicemia ([place]) 03/07/2014   Hypomagnesemia 01/04/2014   Chronic cough 09/04/2011   Sleep apnea, obstructive 09/04/2011    Conditions to be addressed/monitored per PCP order:  Anxiety and Depression  Care Plan : LCSW Plan of Care  Updates made by Greg Cutter, LCSW since 02/19/2021 12:00 AM     Problem: Anxiety Identification (Anxiety)      Long-Range Goal: Anxiety Symptoms  Identified   Start Date: 01/04/2021  Priority: High  Note:   Timeframe:  Long-Range Goal Priority:  High Start Date:    01/04/21                         Expected End Date:   ongoing                    Follow Up Date 03/19/21  Current barriers:   Chronic Mental Health needs related to anxiety, stress and depression Limited social support, Mental Health Concerns , and Social Isolation Needs Support, Education, and Care Coordination in order to meet  unmet mental health needs. Clinical Goal(s): patient will work with SW to address concerns related to increasing coping skills to help managed mood patient will work with [place] to address needs related to finding Munoz mental health psychiatrist    Clinical Interventions:  Assessed patient's previous and current treatment, coping skills, support system and barriers to care  Patient reports that she is in need of only medication management. She moved from [facility] to [facility]. Patient reports that she already has Munoz Social worker in [place] at [employer] that she has built Museum/gallery exhibitions officer with. Patient is agreeable to [place] referral in order to find Munoz psychiatrist for medication management. Referral placed on 01/04/21. Patient has her first initial appointment with Munoz psychiatric nurse practitioner on 02/02/21 at 1:00 pm. This appointment is virtual and she has already received the link to join tele health session for 02/02/21. Update- 02/19/21- Patient reports that she successfully attended her virtual psychiatry visit with her matched psychiatric PA and was placed on Munoz new medication. She reports liking her matched provider and has Munoz follow up appointment with him next month. She was provided [facility]'s customer service number if she encounters any problems with this upcoming virtual visit.  Review various resources, discussed options and provided patient information about Discussed several options for long term counseling  based on need and insurance Depression screen reviewed , Solution-Focused Strategies, Mindfulness or Relaxation Training, Active listening / Reflection utilized , Emotional Supportive Provided, Brief CBT , and Suicidal Ideation/Homicidal Ideation assessed: ; Patient interviewed and appropriate assessments performed Discussed plans with patient for ongoing care management follow up and provided patient with direct contact information for care management team Assisted patient/caregiver with obtaining information about health plan benefits Provided education and assistance to client regarding Advanced Directives. Discussed several options for long term counseling based on need and insurance.  Inter-disciplinary care team collaboration (see longitudinal plan of care) Patient reports that she is still having issues with insomnia and is not sleeping well at night.  LCSW provided education on healthy sleep hygiene and what that looks like. LCSW encouraged patient to implement Munoz night time routine into her schedule that works best for her and that she is able to maintain. Advised patient to implement deep breathing/grounding/meditation/self-care exercises into her nightly routine to combat racing thoughts at night. LCSW encouraged patient to wake up at the same time each day, make her sleeping environment comfortable, exercise when able, to limit naps and to not eat or drink anything right before bed.   Patient Goals/Self-Care Activities: Over the next 120 days - barriers to treatment adherence identified - complementary therapy use encouraged - counseling provided - depression screen reviewed - self-awareness of emotional triggers encouraged - strategies to manage emotional triggers promoted - avoid negative self-talk - develop Munoz personal safety plan - develop Munoz plan to deal with triggers like holidays, anniversaries - exercise at least 2 to 3 times per week - have Munoz plan for how to handle bad days -  journal feelings and what helps to feel better or worse - spend time or talk with others at least 2 to 3 times per week - spend time or talk with others every day - watch for early signs of feeling worse - write in journal every day - begin personal counseling - call and visit an old friend - check out volunteer opportunities - join Munoz support group - laugh; watch Munoz funny movie or comedian - learn and use visualization or  guided imagery - perform Munoz random act of kindness - practice relaxation or meditation daily - start or continue Munoz personal journal - talk about feelings with Munoz friend, family or spiritual advisor - practice positive thinking and self-talk I have placed Munoz referral with [facility] to assist with connecting you with Munoz mental health provider. they will contact you once Munoz provider is located. -Update appointment scheduled for 02/02/21.        Follow up:  Patient agrees to Care Plan and Follow-up.  Plan: The Managed Medicaid care management team will reach out to the patient again over the next 30 days.  Date of next scheduled Social Work care management/care coordination outreach:  03/19/21  [name], BSW, MSW, LCSW Managed Medicaid LCSW [email or web address] Phone: [phone]

## 2021-02-19 NOTE — Patient Instructions (Signed)
Visit Information  [name] was given information about Medicaid Managed Care team care coordination services as a part of their [place] Complete Medicaid benefit. [name] verbally consented to engagement with the [facility] Managed Care team.   If you are experiencing a medical emergency, please call 911 or report to your local emergency department or urgent care.   If you have a non-emergency medical problem during routine business hours, please contact your provider's office and ask to speak with a nurse.   For questions related to your [place] Complete Medicaid health plan, please call: [phone]  If you would like to schedule transportation through your [place] Complete Medicaid plan, please call the following number at least 2 days in advance of your appointment: [phone].   Call the [place] at [phone], at any time, 24 hours a day, 7 days a week. If you are in danger or need immediate medical attention call 911.  If you would like help to quit smoking, call 1-800-QUIT-NOW (843)405-9422) OR Espaol: 1-855-Djelo-Ya HD:1601594) o para ms informacin haga clic aqu or Text READY to 200-400 to register via text  [name] - following are the goals we discussed in your visit today:   Goals Addressed             This Visit's Progress    Manage My Emotions       Timeframe:  Long-Range Goal Priority:  High Start Date:    01/04/21                         Expected End Date:   ongoing                    Follow Up Date 03/19/21   - begin personal counseling - call and visit an old friend - check out volunteer opportunities - join a support group - laugh; watch a funny movie or comedian - learn and use visualization or guided imagery - perform a random act of kindness - practice relaxation or meditation daily - start or continue a personal journal - talk about feelings with a friend, family or spiritual advisor - practice  positive thinking and self-talk    Why is this important?   When you are stressed, down or upset, your body reacts too.  For example, your blood pressure may get higher; you may have a headache or stomachache.  When your emotions get the best of you, your body's ability to fight off cold and flu gets weak.  These steps will help you manage your emotions.     Notes:        [name], BSW, MSW, [employer] Managed Medicaid LCSW [email or web address] Phone: [phone]

## 2021-02-22 ENCOUNTER — Other Ambulatory Visit: Payer: Self-pay

## 2021-02-22 ENCOUNTER — Other Ambulatory Visit: Payer: Self-pay | Admitting: Obstetrics and Gynecology

## 2021-02-22 NOTE — Patient Outreach (Signed)
Medicaid Managed Care   Nurse Care Manager Note  02/22/2021 Name:  Sara Munoz MRN:  [id number] DOB:  10-14-1965  Sara Munoz is an 55 y.o. year old female who is a primary patient of Sara Munoz, [place].  The [facility] Managed Care Coordination team was consulted for assistance with:    Chronic healthcare management needs.  Sara Munoz was given information about Medicaid Managed Care Coordination team services today. Sara Munoz Patient agreed to services and verbal consent obtained.  Engaged with patient by telephone for follow up visit in response to provider referral for case management and/or care coordination services.   Assessments/Interventions:  Review of past medical history, allergies, medications, health status, including review of consultants reports, laboratory and other test data, was performed as part of comprehensive evaluation and provision of chronic care management services.  SDOH (Social Determinants of Health) assessments and interventions performed: SDOH Interventions    Flowsheet Row Most Recent Value  SDOH Interventions   Housing Interventions Intervention Not Indicated  Physical Activity Interventions Other (Comments)  [patient with chronic pain]       Care Plan  Allergies  Allergen Reactions   Flexeril [Cyclobenzaprine] Hives and Swelling    Facial/lip swelling      Levofloxacin Hives and Swelling   Phenergan [Promethazine Hcl] Other (See Comments)    Agitation.    Toradol [Ketorolac Tromethamine] Swelling and Other (See Comments)    Facial/tongue swelling    Tramadol Hives, Swelling and Other (See Comments)    Lip swelling    Zoloft [Sertraline Hcl] Swelling    Tongue swelling       Medications Reviewed Today     Reviewed by [name], RN (Registered Nurse) on 02/22/21 at 1532  Med List Status: <None>   Medication Order Taking? Sig Documenting Provider Last Dose Status Informant  albuterol (PROVENTIL) (2.5 MG/3ML) 0.083%  nebulizer solution [id number] No Take 3 mLs (2.5 mg total) by nebulization every 6 (six) hours as needed for wheezing or shortness of breath.  Patient taking differently: Take 2.5 mg by nebulization 2 (two) times daily.   [name], MD Taking Active   amLODipine (NORVASC) 5 MG tablet [id number]  TAKE ONE TABLET BY MOUTH ONCE DAILY [name], [name], [name]  Active   atorvastatin (LIPITOR) 20 MG tablet [id number]  TAKE ONE TABLET BY MOUTH EVERY EVENING [name], [name], [name]  Active   buprenorphine-naloxone (SUBOXONE) 8-2 mg SUBL SL tablet [id number]  Place 1 tablet under the tongue daily. [provider]  Active Self  cyclobenzaprine (FLEXERIL) 10 MG tablet [id number]  Take 10 mg by mouth 2 (two) times daily as needed for muscle spasms. [provider]  Active Self  docusate sodium (COLACE) 100 MG capsule [id number] No Take 200 mg by mouth daily as needed for moderate constipation.  [provider] Taking Active Self  DULoxetine (CYMBALTA) 60 MG capsule [id number] No Take 1 capsule (60 mg total) by mouth daily. Please call [phone] to schedule appt or may request future refills from PCP.  Patient not taking: No sig reported   [name], MD Not Taking Active   gabapentin (NEURONTIN) 600 MG tablet [id number] No Take 600 mg by mouth 4 (four) times daily. [provider] Taking Active Self  hydrOXYzine (ATARAX/VISTARIL) 50 MG tablet [id number] No Take 1 tablet (50 mg total) by mouth 3 (three) times daily as needed. Sable Feil, [name] Taking Active   linaclotide Rolan Lipa) 290 MCG CAPS capsule [id number]  Take 1 capsule (290 mcg total) by mouth daily before breakfast. [name], MD  Active   lisinopril (ZESTRIL) 40 MG tablet [id number]  TAKE ONE TABLET BY MOUTH EVERY EVENING [name], [name], [name]  Active   meloxicam (MOBIC) 15 MG tablet [id number]  Take 15 mg by mouth daily. [provider]  Active Self  metaxalone (SKELAXIN) 800 MG tablet [id number] No  Take 800 mg by mouth 3 (three) times daily. [provider] Not Taking Active   methocarbamol (ROBAXIN) 750 MG tablet [id number] No Take 1 tablet (750 mg total) by mouth every 6 (six) hours as needed for muscle spasms. Sable Feil, [name] Not Taking Active   methylPREDNISolone (MEDROL DOSEPAK) 4 MG TBPK tablet [id number] No Take Tapered dose as directed Sable Feil, [name] Not Taking Active   metoprolol tartrate (LOPRESSOR) 25 MG tablet [id number]  Take 1 tablet (25 mg total) by mouth 2 (two) times daily. [name], [name], [name]  Active   naloxone [facility]) 4 MG/0.1ML LIQD nasal spray kit [id number] No Place 1 spray into the nose as needed (opioid overdose). [provider] Taking Active Self           Med Note [name]   Wed May 10, 2020 12:59 PM) Has both the nasal spray and injection  Naloxone HCl (NARCAN IJ) [id number] No Inject 1 Dose as directed as needed (opioid overdose). [provider] Taking Active Self           Med Note [name]   Wed May 10, 2020 12:59 PM) Has both the nasal spray and injection  nitroGLYCERIN (NITROSTAT) 0.4 MG SL tablet [id number]  Place 1 tablet (0.4 mg total) under the tongue every 5 (five) minutes x 3 doses as needed for chest pain. [name], [name], [name]  Active   omeprazole (PRILOSEC) 40 MG capsule [id number] No Take 1 capsule (40 mg total) by mouth 2 (two) times daily.  Patient not taking: No sig reported   [name], MD Not Taking Active   omeprazole (PRILOSEC) 40 MG capsule [id number] No Take 1 capsule (40 mg total) by mouth 2 (two) times daily. [name], MD Taking Expired 02/11/21 2359   ondansetron (ZOFRAN) 4 MG tablet [id number] No Take 1 tablet (4 mg total) by mouth every 8 (eight) hours as needed for nausea or vomiting. [name], MD Taking Active   ondansetron (ZOFRAN-ODT) 8 MG disintegrating tablet [id number] No Take 1 tablet (8 mg total) by mouth 2 (two) times daily. [name], MD Taking Active   oxyCODONE  (ROXICODONE) 5 MG immediate release tablet [id number] No Take 4 tablets (20 mg total) by mouth every 6 (six) hours as needed (pain). Judith Part, MD Not Taking Active   PROAIR HFA 108 [phone] Base) MCG/ACT inhaler [id number]  INHALE TWO PUFFS BY MOUTH INTO LUNGS EVERY 6 HOURS AS NEEDED FOR SHORTNESS OF BREATH OR wheezing [name], Driscilla Grammes  Active   SPIRIVA RESPIMAT 2.5 MCG/ACT AERS [id number]  INHALE 1 PUFF BY MOUTH INTO LUNGS DAILY [name], [name], [name]  Active   sucralfate (CARAFATE) 1 GM/10ML suspension [id number] No TAKE 10 MLS (1 G TOTAL) BY MOUTH 4 (FOUR) TIMES DAILY. [name], MD Taking Active   SYMBICORT 160-4.5 MCG/ACT inhaler [id number]  INHALE TWO PUFFS BY MOUTH INTO LUNGS TWICE DAILY [name], [name], [name]  Active             Patient Active Problem List   Diagnosis Date Noted  Lumbar radiculopathy 05/23/2020   Sepsis ([place]) 10/19/2018   Severe sepsis ([place]) 10/19/2018   Cervical radiculopathy 02/12/2018   Chronic migraine 02/12/2018   Paresthesia 08/29/2017   Neck pain 08/29/2017   Daily headache 08/29/2017   Chronic active hepatitis ([place]) 07/29/2017   Neurogenic pain 07/31/2016   Chronic pain syndrome 06/05/2016   Carotid artery stenosis 02/27/2016   Chronic constipation 02/27/2016   Chronic nausea 02/27/2016   Hypercholesterolemia 02/27/2016   Osteoarthritis 02/27/2016   PTSD (Munoz-traumatic stress disorder) 02/27/2016   TIA (transient ischemic attack) 02/27/2016   Long term current use of opiate analgesic 02/27/2016   Long term prescription opiate use 02/27/2016   Encounter for pain management consult 02/27/2016   Chronic hip pain (Location of Tertiary source of pain) (Bilateral) (L>R) 02/27/2016   Chronic knee pain (Bilateral) (L>R) 02/27/2016   Chronic shoulder pain (Bilateral) (L>R) 02/27/2016   Chronic sacroiliac joint pain (Bilateral) (L>R) 02/27/2016   Chronic low back pain (Location of Primary Source of Pain) (Bilateral) (L>R) 02/27/2016    Chronic lower extremity pain (Location of Secondary source of pain) (Bilateral) (L>R) 02/27/2016   Osteoarthritis of hip (Bilateral) (L>R) 02/27/2016   Chronic neck pain (posterior midline) (Bilateral) (L>R) 02/27/2016   Cervicogenic headache (Bilateral) (L>R) 02/27/2016   Occipital headache (Bilateral) (L>R) 02/27/2016   Chronic upper extremity pain (Bilateral) (L>R) 02/27/2016   Chronic Cervical radicular pain (Bilateral) (L>R) 02/27/2016   Chronic lumbar radicular pain (Right) (L5 dermatome) 02/27/2016   Lumbar facet syndrome (Bilateral) (L>R) 02/27/2016   Long term prescription benzodiazepine use 02/27/2016   Hypertension    Chronic obstructive pulmonary disease ([place]) 10/09/2015   GERD (gastroesophageal reflux disease) 10/09/2015   Non-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/2014 08/02/2015   Hyperlipidemia 08/02/2015   Costochondritis 08/02/2015   Drug abuse, 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   GAD (generalized anxiety disorder) 09/03/2014   Narcotic dependence ([place]) 09/03/2014   PVD (peripheral vascular disease) ([place]) 09/03/2014   Smoker 09/03/2014   Cigarette nicotine dependence with nicotine-induced disorder 07/08/2014   Anemia of chronic disease 03/19/2014   Narcotic abuse, continuous ([place]) 03/19/2014   Polysubstance abuse ([place]) 03/19/2014   MSSA (methicillin susceptible Staphylococcus aureus) septicemia ([place]) 03/07/2014   Hypomagnesemia 01/04/2014   Chronic cough 09/04/2011   Sleep apnea, obstructive 09/04/2011    Conditions to be addressed/monitored per PCP order:   chronic healthcare management needs,  CAD, HTN, PVD, TIA, migraines, copd, OSA, GERD hepatitis, tobacco use, ostoarthritis, chronic pain, anxiety.  Care Plan : General Plan of Care (Adult)  Updates made by [name], RN since 02/22/2021 12:00 AM     Problem: Health Promotion or Disease Self-Management (General Plan of Care)   Priority: High  Onset Date: 12/20/2020     Long-Range Goal: Self-Management Plan Developed   Start  Date: 12/20/2020  Expected End Date: 05/24/2021  Recent Progress: Not on track  Priority: High  Note:    Current Barriers:  Knowledge Deficits related to short term plan for care coordination needs and long term plans for chronic disease management needs.  Patient with many chronic health conditions-CAD, HTN, PVD, TIA, migraines, COPD, OSA, GERD, hepatitis, tobacco use, osteoarthritis, chronic pain, anxiety, poor appetite. Nurse Case Manager Clinical Goal(s):  patient will work with care management team to address care coordination and chronic disease management needs related to Disease Management Educational Needs Care Coordination Medication Management and Education Medication Reconciliation Medication Assistance  Psychosocial Support Mental Health Counseling   Interventions:  Evaluation of current treatment plan  and patient's adherence  to plan as established by provider. Advised patient to contact PCP regarding blood pressure. Update 01/22/21:  Patient has appointment with PCP 01/23/21. Update 02/22/21:  Patient seen and evaluated by PCP 01/23/21.  Patient checks blood pressure twice a week-WNL per patient, although could not give readings. Reviewed medications with patient. Collaborated with pharmacy regarding medications. Update 02/22/21:  Pharmacy following patient.  Collaborated with SW regarding anxiety. Update 02/22/21:  SW following patient. Discussed plans with patient for ongoing care management follow up and provided patient with direct contact information for care management team Update 02/22/21:  Discussed meal supplements. Advised patient, providing education and rationale, to monitor blood pressure daily and record, calling PCP for findings outside established parameters.  Reviewed scheduled/upcoming provider appointments. Social Work referral for anxiety, PTSD. Update 01/22/21:  Patient has met with SW and continues to follow-[facility] referral made-patient to call back and schedule  appointment. Update 02/22/21:  Patient had virtual appointment with Psychiatrist 02/02/21. Pharmacy referral for medication review. Update 01/22/21:  Patient has met with Pharmacist and continues to follow. Patient given 1-800-QUITNOW for smoking cessation if interested. Update 01/22/21:  Patient states she is smoking 1/2 ppd now and trying to quit-has patches to use if she chooses. Update 02/22/21:  Smoking 1/2-1ppd. Self Care Activities:  Patient will self administer medications as prescribed Patient will attend all scheduled provider appointments Patient will call pharmacy for medication refills Patient will continue to perform ADL's independently Patient will call provider office for new concerns or questions Patient Goals:  In the next 30 days, patient will call [facility] back to schedule appointment for anxiety/PTSD. In the next 90 days, patient will attend all scheduled appointments. - Follow Up Plan: The patient has been provided with contact information for the care management team and has been advised to call with any health related questions or concerns.  The care management team will reach out to the patient again over the next 30 days.  Evidence-based guidance:   Review biopsychosocial determinants of health screens.  Review need for preventive screening based on age, sex, family history and health history.  Determine level of modifiable health risk.   Discuss identified risks.  Identify areas where behavior change may lead to improved health.  Promote healthy lifestyle.  Evoke change talk using open-ended questions, pros and cons, as well as looking forward.  Identify and manage conditions or preconditions to reduce health risk.  Implement additional goals and interventions based on identified risk factors.     Follow Up:  Patient agrees to Care Plan and Follow-up.  Plan: The Managed Medicaid care management team will reach out to the patient again over the next 30 days. and The  patient has been provided with contact information for the Managed Medicaid care management team and has been advised to call with any health related questions or concerns.  Date/time of next scheduled RN care management/care coordination outreach:  03/21/21 at 230.

## 2021-02-22 NOTE — Patient Instructions (Signed)
Hi Sara Munoz, thank you for speaking with me today-have a nice afternoon.  Sara Munoz was given information about Medicaid Managed Care team care coordination services as a part of their [place] Complete Medicaid benefit. Sara Munoz verbally consented to engagement with the [facility] Managed Care team.   If you are experiencing a medical emergency, please call 911 or report to your local emergency department or urgent care.   If you have a non-emergency medical problem during routine business hours, please contact your provider's office and ask to speak with a nurse.   For questions related to your [place] Complete Medicaid health plan, please call: [phone]  If you would like to schedule transportation through your [place] Complete Medicaid plan, please call the following number at least 2 days in advance of your appointment: [phone].   Call the [place] at [phone], at any time, 24 hours a day, 7 days a week. If you are in danger or need immediate medical attention call 911.  If you would like help to quit smoking, call 1-800-QUIT-NOW 256-834-9839) OR Espaol: 1-855-Djelo-Ya (7-867-672-0947) o para ms informacin haga clic aqu or Text READY to 200-400 to register via text  Sara Munoz - following are the goals we discussed in your visit today:   Goals Addressed             This Visit's Progress    Protect My Health       Timeframe:  Long-Range Goal Priority:  High Start Date:       12/20/20                      Expected End Date:    ongoing                   Follow Up Date:  03/24/21.   - schedule appointment for flu shot - schedule appointment for vaccines needed due to my age or health - schedule recommended health tests (blood work, mammogram, colonoscopy, pap test) - schedule and keep appointment for annual check-up   Update 01/22/21:  Patient has follow up appointment with PCP 01/23/21. Update 02/22/21:  Patient seen and evaluated  by PCP 01/23/21.  Has appointment with neurosurgeon 03/24/21.   Why is this important?   Screening tests can find diseases early when they are easier to treat.  Your doctor or nurse will talk with you about which tests are important for you.  Getting shots for common diseases like the flu and shingles will help prevent them.        The patient verbalized understanding of instructions provided today and declined a print copy of patient instruction materials.   The Managed Medicaid care management team will reach out to the patient again over the next 30 days.  The  Patient  has been provided with contact information for the Managed Medicaid care management team and has been advised to call with any health related questions or concerns.   [name] RN, BSN [place]  [facility] - Managed Medicaid High Risk [phone].  Following is a copy of your plan of care:  Patient Care Plan: General Plan of Care (Adult)     Problem Identified: Health Promotion or Disease Self-Management (General Plan of Care)   Priority: High  Onset Date: 12/20/2020     Long-Range Goal: Self-Management Plan Developed   Start Date: 12/20/2020  Expected End Date: 05/24/2021  Recent Progress: Not on track  Priority: High  Note:   Current Barriers:  Knowledge Deficits related to short term plan for care coordination needs and long term plans for chronic disease management needs.  Patient with many chronic health conditions-CAD, HTN, PVD, TIA, migraines, COPD, OSA, GERD, hepatitis, tobacco use, osteoarthritis, chronic pain, anxiety, poor appetite. Nurse Case Manager Clinical Goal(s):  patient will work with care management team to address care coordination and chronic disease management needs related to Disease Management Educational Needs Care Coordination Medication Management and Education Medication Reconciliation Medication Assistance  Psychosocial  Support Mental Health Counseling   Interventions:  Evaluation of current treatment plan  and patient's adherence to plan as established by provider. Advised patient to contact PCP regarding blood pressure. Update 01/22/21:  Patient has appointment with PCP 01/23/21. Update 02/22/21:  Patient seen and evaluated by PCP 01/23/21.  Patient checks blood pressure twice a week-WNL per patient, although could not give readings. Reviewed medications with patient. Collaborated with pharmacy regarding medications. Update 02/22/21:  Pharmacy following patient.  Collaborated with SW regarding anxiety. Update 02/22/21:  SW following patient. Discussed plans with patient for ongoing care management follow up and provided patient with direct contact information for care management team Update 02/22/21:  Discussed meal supplements. Advised patient, providing education and rationale, to monitor blood pressure daily and record, calling PCP for findings outside established parameters.  Reviewed scheduled/upcoming provider appointments. Social Work referral for anxiety, PTSD. Update 01/22/21:  Patient has met with SW and continues to follow-[facility] referral made-patient to call back and schedule appointment. Update 02/22/21:  Patient had virtual appointment with Psychiatrist 02/02/21. Pharmacy referral for medication review. Update 01/22/21:  Patient has met with Pharmacist and continues to follow. Patient given 1-800-QUITNOW for smoking cessation if interested. Update 01/22/21:  Patient states she is smoking 1/2 ppd now and trying to quit-has patches to use if she chooses. Update 02/22/21:  Smoking 1/2-1ppd. Self Care Activities:  Patient will self administer medications as prescribed Patient will attend all scheduled provider appointments Patient will call pharmacy for medication refills Patient will continue to perform ADL's independently Patient will call provider office for new concerns or questions Patient Goals:  In the  next 30 days, patient will call [facility] back to schedule appointment for anxiety/PTSD. In the next 90 days, patient will attend all scheduled appointments. - Follow Up Plan: The patient has been provided with contact information for the care management team and has been advised to call with any health related questions or concerns.  The care management team will reach out to the patient again over the next 30 days.  Evidence-based guidance:   Review biopsychosocial determinants of health screens.  Review need for preventive screening based on age, sex, family history and health history.  Determine level of modifiable health risk.   Discuss identified risks.  Identify areas where behavior change may lead to improved health.  Promote healthy lifestyle.  Evoke change talk using open-ended questions, pros and cons, as well as looking forward.  Identify and manage conditions or preconditions to reduce health risk.  Implement additional goals and interventions based on identified risk factors.

## 2021-03-01 DIAGNOSIS — M542 Cervicalgia: Secondary | ICD-10-CM | POA: Diagnosis not present

## 2021-03-01 DIAGNOSIS — M25512 Pain in left shoulder: Secondary | ICD-10-CM | POA: Diagnosis not present

## 2021-03-01 DIAGNOSIS — M79661 Pain in right lower leg: Secondary | ICD-10-CM | POA: Diagnosis not present

## 2021-03-01 DIAGNOSIS — Z79891 Long term (current) use of opiate analgesic: Secondary | ICD-10-CM | POA: Diagnosis not present

## 2021-03-01 DIAGNOSIS — M5136 Other intervertebral disc degeneration, lumbar region: Secondary | ICD-10-CM | POA: Diagnosis not present

## 2021-03-01 DIAGNOSIS — M25511 Pain in right shoulder: Secondary | ICD-10-CM | POA: Diagnosis not present

## 2021-03-01 DIAGNOSIS — M545 Low back pain, unspecified: Secondary | ICD-10-CM | POA: Diagnosis not present

## 2021-03-01 DIAGNOSIS — M79622 Pain in left upper arm: Secondary | ICD-10-CM | POA: Diagnosis not present

## 2021-03-01 DIAGNOSIS — G894 Chronic pain syndrome: Secondary | ICD-10-CM | POA: Diagnosis not present

## 2021-03-01 DIAGNOSIS — M79662 Pain in left lower leg: Secondary | ICD-10-CM | POA: Diagnosis not present

## 2021-03-01 DIAGNOSIS — M79621 Pain in right upper arm: Secondary | ICD-10-CM | POA: Diagnosis not present

## 2021-03-02 DIAGNOSIS — G4700 Insomnia, unspecified: Secondary | ICD-10-CM | POA: Diagnosis not present

## 2021-03-02 DIAGNOSIS — F419 Anxiety disorder, unspecified: Secondary | ICD-10-CM | POA: Diagnosis not present

## 2021-03-19 ENCOUNTER — Other Ambulatory Visit: Payer: Self-pay

## 2021-03-19 ENCOUNTER — Other Ambulatory Visit: Payer: Self-pay | Admitting: Family Medicine

## 2021-03-19 DIAGNOSIS — I10 Essential (primary) hypertension: Secondary | ICD-10-CM

## 2021-03-19 DIAGNOSIS — E785 Hyperlipidemia, unspecified: Secondary | ICD-10-CM

## 2021-03-19 DIAGNOSIS — J432 Centrilobular emphysema: Secondary | ICD-10-CM

## 2021-03-19 NOTE — Telephone Encounter (Signed)
Requested medications are due for refill today yes  Requested medications are on the active medication list yes  Last refill 02/12/21  Last visit 01/23/21  Future visit scheduled no  Notes to clinic Pt only given 30 day supply of each med 3 weeks after visit, no upcoming visit scheduled. Lab work was ordered but [employer]. Last lipid lab work 01/2018. Please assess since only given 30 day supply after OV, no upcoming appt. PCP listed as Margretta Sidle PA.

## 2021-03-19 NOTE — Patient Outreach (Signed)
[facility]) Care Management  [place]  03/19/2021   Sara Munoz 31-Jan-1966 [id number]   Encounter Medications:  Outpatient Encounter Medications as of 03/19/2021  Medication Sig Note   albuterol (PROVENTIL) (2.5 MG/3ML) 0.083% nebulizer solution Take 3 mLs (2.5 mg total) by nebulization every 6 (six) hours as needed for wheezing or shortness of breath. (Patient taking differently: Take 2.5 mg by nebulization 2 (two) times daily.)    amLODipine (NORVASC) 5 MG tablet TAKE ONE TABLET BY MOUTH ONCE DAILY    atorvastatin (LIPITOR) 20 MG tablet TAKE ONE TABLET BY MOUTH EVERY EVENING    buprenorphine-naloxone (SUBOXONE) 8-2 mg SUBL SL tablet Place 1 tablet under the tongue daily.    cyclobenzaprine (FLEXERIL) 10 MG tablet Take 10 mg by mouth 2 (two) times daily as needed for muscle spasms.    docusate sodium (COLACE) 100 MG capsule Take 200 mg by mouth daily as needed for moderate constipation.     DULoxetine (CYMBALTA) 60 MG capsule Take 1 capsule (60 mg total) by mouth daily. Please call [phone] to schedule appt or may request future refills from PCP. (Patient not taking: No sig reported)    gabapentin (NEURONTIN) 600 MG tablet Take 600 mg by mouth 4 (four) times daily.    hydrOXYzine (ATARAX/VISTARIL) 50 MG tablet Take 1 tablet (50 mg total) by mouth 3 (three) times daily as needed.    linaclotide (LINZESS) 290 MCG CAPS capsule Take 1 capsule (290 mcg total) by mouth daily before breakfast.    lisinopril (ZESTRIL) 40 MG tablet TAKE ONE TABLET BY MOUTH EVERY EVENING    meloxicam (MOBIC) 15 MG tablet Take 15 mg by mouth daily.    metaxalone (SKELAXIN) 800 MG tablet Take 800 mg by mouth 3 (three) times daily.    methocarbamol (ROBAXIN) 750 MG tablet Take 1 tablet (750 mg total) by mouth every 6 (six) hours as needed for muscle spasms.    methylPREDNISolone (MEDROL DOSEPAK) 4 MG TBPK tablet Take Tapered dose as directed    metoprolol tartrate (LOPRESSOR) 25 MG  tablet Take 1 tablet (25 mg total) by mouth 2 (two) times daily.    naloxone (NARCAN) 4 MG/0.1ML LIQD nasal spray kit Place 1 spray into the nose as needed (opioid overdose). 05/10/2020: Has both the nasal spray and injection   Naloxone HCl (NARCAN IJ) Inject 1 Dose as directed as needed (opioid overdose). 05/10/2020: Has both the nasal spray and injection   nitroGLYCERIN (NITROSTAT) 0.4 MG SL tablet Place 1 tablet (0.4 mg total) under the tongue every 5 (five) minutes x 3 doses as needed for chest pain.    omeprazole (PRILOSEC) 40 MG capsule Take 1 capsule (40 mg total) by mouth 2 (two) times daily. (Patient not taking: No sig reported)    omeprazole (PRILOSEC) 40 MG capsule Take 1 capsule (40 mg total) by mouth 2 (two) times daily.    ondansetron (ZOFRAN) 4 MG tablet Take 1 tablet (4 mg total) by mouth every 8 (eight) hours as needed for nausea or vomiting.    ondansetron (ZOFRAN-ODT) 8 MG disintegrating tablet Take 1 tablet (8 mg total) by mouth 2 (two) times daily.    oxyCODONE (ROXICODONE) 5 MG immediate release tablet Take 4 tablets (20 mg total) by mouth every 6 (six) hours as needed (pain).    PROAIR HFA 108 (90 Base) MCG/ACT inhaler INHALE TWO PUFFS BY MOUTH INTO LUNGS EVERY 6 HOURS AS NEEDED FOR SHORTNESS OF BREATH OR wheezing    SPIRIVA RESPIMAT  2.5 MCG/ACT AERS INHALE 1 PUFF BY MOUTH INTO LUNGS DAILY    sucralfate (CARAFATE) 1 GM/10ML suspension TAKE 10 MLS (1 G TOTAL) BY MOUTH 4 (FOUR) TIMES DAILY.    SYMBICORT 160-4.5 MCG/ACT inhaler INHALE TWO PUFFS BY MOUTH INTO LUNGS TWICE DAILY    No facility-administered encounter medications on file as of 03/19/2021.    Functional Status:  In your present state of health, do you have any difficulty performing the following activities: 05/23/2020 05/16/2020  Hearing? N N  Vision? N N  Difficulty concentrating or making decisions? N N  Walking or climbing stairs? N N  Dressing or bathing? N N  Doing errands, shopping? N N  Some recent data  might be hidden    Fall/Depression Screening: Fall Risk  03/26/2018 02/20/2018 08/05/2016  Falls in the past year? Yes No No  Number falls in past yr: 1 - -  Comment - - -  Injury with Fall? No - -  Comment - - -  Risk Factor Category  - - -  Risk for fall due to : History of fall(s);Impaired balance/gait;Impaired mobility - -  Risk for fall due to: Comment - - -  Follow up Education provided;Falls prevention discussed - -   PHQ 2/9 Scores 01/11/2021 04/03/2020 02/20/2018 08/05/2016 07/31/2016 06/26/2016 06/05/2016  PHQ - 2 Score _0 0 0 0 0  PHQ- 9 Score _1 - - - -    Assessment:   Care Plan There are no care plans that you recently modified to display for this patient.    Goals Addressed   None     As a part of your Medicaid benefit, you are eligible for care management and care coordination services at no cost or copay. I was unable to reach you by phone today but would be happy to help you with your health related needs. Please feel free to call me @ [phone].    A member of the Managed Medicaid care management team will reach out to you again over the next 7-14 days.   [name], BSW, MSW, [employer] Managed Medicaid LCSW [email or web address] Phone: [phone]

## 2021-03-20 NOTE — Telephone Encounter (Signed)
Please review.  Pt saw Simona Huh and I'm not able to read his note.   Thanks,   Mickel Baas

## 2021-03-20 NOTE — Patient Instructions (Signed)
[name] ,   [facility] Managed Care Team is available to provide assistance to you with your healthcare needs at no cost and as a benefit of your [facility] plan. I'm sorry I was unable to reach you today for our scheduled appointment. Our care guide will call you to reschedule our telephone appointment. Please call me at the number below. I am available to be of assistance to you regarding your healthcare needs. .   Thank you,   [name], BSW, MSW, LCSW Managed Medicaid LCSW [email or web address] Phone: [phone]

## 2021-03-21 ENCOUNTER — Telehealth: Payer: Self-pay | Admitting: Gastroenterology

## 2021-03-21 ENCOUNTER — Other Ambulatory Visit: Payer: Self-pay | Admitting: Neurological Surgery

## 2021-03-21 ENCOUNTER — Other Ambulatory Visit: Payer: Self-pay

## 2021-03-21 ENCOUNTER — Other Ambulatory Visit: Payer: Self-pay | Admitting: Obstetrics and Gynecology

## 2021-03-21 DIAGNOSIS — M5412 Radiculopathy, cervical region: Secondary | ICD-10-CM

## 2021-03-21 NOTE — Patient Instructions (Signed)
Hi Sara Munoz, thank you for soeaking with me today, have a good afternoon.  Sara Munoz was given information about Medicaid Managed Care team care coordination services as a part of their [place] Complete Medicaid benefit. Sara Munoz verbally consented to engagement with the [facility] Managed Care team.   If you are experiencing a medical emergency, please call 911 or report to your local emergency department or urgent care.   If you have a non-emergency medical problem during routine business hours, please contact your provider's office and ask to speak with a nurse.   For questions related to your [place] Medicaid health plan, please call: [phone]  If you would like to schedule transportation through your [place] Complete Medicaid plan, please call the following number at least 2 days in advance of your appointment: [phone].   Call the [place] at [phone], at any time, 24 hours a day, 7 days a week. If you are in danger or need immediate medical attention call 911.  If you would like help to quit smoking, call 1-800-QUIT-NOW 225 372 6752) OR Espaol: 1-855-Djelo-Ya (9-357-017-7939) o para ms informacin haga clic aqu or Text READY to 200-400 to register via text  Sara Munoz - following are the goals we discussed in your visit today:   Goals Addressed             This Visit's Progress    Protect My Health       Timeframe:  Long-Range Goal Priority:  High Start Date:       12/20/20                      Expected End Date:    ongoing                   Follow Up Date: 04/20/21   - schedule appointment for flu shot - schedule appointment for vaccines needed due to my age or health - schedule recommended health tests (blood work, mammogram, colonoscopy, pap test) - schedule and keep appointment for annual check-up   Update 01/22/21:  Patient has follow up appointment with PCP 01/23/21. Update 02/22/21:  Patient seen and evaluated  by PCP 01/23/21.  Has appointment with neurosurgeon 03/24/21. Update 03/21/21:  Appointment with pain management 10/6, CT 10/8, Neurologist in November.   Why is this important?   Screening tests can find diseases early when they are easier to treat.  Your doctor or nurse will talk with you about which tests are important for you.  Getting shots for common diseases like the flu and shingles will help prevent them.             The patient verbalized understanding of instructions provided today and declined a print copy of patient instruction materials.   The Managed Medicaid care management team will reach out to the patient again over the next 30 days.  The  Patient has been provided with contact information for the Managed Medicaid care management team and has been advised to call with any health related questions or concerns.   [name] RN, BSN [place]  [facility] - Managed Medicaid High Risk [phone].   Following is a copy of your plan of care:  Patient Care Plan: General Plan of Care (Adult)     Problem Identified: Health Promotion or Disease Self-Management (General Plan of Care)   Priority: High  Onset Date: 12/20/2020     Long-Range Goal: Self-Management  Plan Developed   Start Date: 12/20/2020  Expected End Date: 05/24/2021  Recent Progress: Not on track  Priority: High  Note:   Current Barriers:  Knowledge Deficits related to short term plan for care coordination needs and long term plans for chronic disease management needs.  Patient with many chronic health conditions-CAD, HTN, PVD, TIA, migraines, COPD, OSA, GERD, hepatitis, tobacco use, osteoarthritis, chronic pain, anxiety, poor appetite, constipation Nurse Case Manager Clinical Goal(s):  patient will work with care management team to address care coordination and chronic disease management needs related to Disease Management Educational Needs Care  Coordination Medication Management and Education Medication Reconciliation Medication Assistance  Psychosocial Support Mental Health Counseling   Interventions:  Evaluation of current treatment plan  and patient's adherence to plan as established by provider. Advised patient to contact PCP regarding blood pressure. Update 01/22/21:  Patient has appointment with PCP 01/23/21. Update 02/22/21:  Patient seen and evaluated by PCP 01/23/21.  Patient checks blood pressure twice a week-WNL per patient, although could not give readings. Update 03/21/21:  Blood pressure 120/90. Reviewed medications with patient. Collaborated with pharmacy regarding medications. Update 02/22/21:  Pharmacy following patient.  Collaborated with SW regarding anxiety. Update 02/22/21:  SW following patient. Discussed plans with patient for ongoing care management follow up and provided patient with direct contact information for care management team Update 02/22/21:  Discussed meal supplements. Update 03/21/21:  patient states she is eating cereal and ice cream, to schedule an appointment with provider to discuss appetite, supplements, and constipation. Advised patient, providing education and rationale, to monitor blood pressure daily and record, calling PCP for findings outside established parameters.  Reviewed scheduled/upcoming provider appointments. Social Work referral for anxiety, PTSD. Update 01/22/21:  Patient has met with SW and continues to follow-[facility] referral made-patient to call back and schedule appointment. 03/21/21-speaking with counselor twice a week. Update 02/22/21:  Patient had virtual appointment with Psychiatrist 02/02/21-continues to follow once a month. Pharmacy referral for medication review. Update 01/22/21:  Patient has met with Pharmacist and continues to follow. Patient given 1-800-QUITNOW for smoking cessation if interested. Update 01/22/21:  Patient states she is smoking 1/2 ppd now and trying to quit-has  patches to use if she chooses. Update 02/22/21:  Smoking 1/2-1ppd. Update 03/21/21:  Smokes 1ppd, has not tried patches. Self Care Activities:  Patient will self administer medications as prescribed Patient will attend all scheduled provider appointments Patient will call pharmacy for medication refills Patient will continue to perform ADL's independently Patient will call provider office for new concerns or questions Patient Goals:  In the next 30 days, patient will call [facility] back to schedule appointment for anxiety/PTSD. Update 03/21/21:  Patient talks to counselor twice a week and sees Psychiatrist once a month. In the next 90 days, patient will attend all scheduled appointments. - Follow Up Plan: The patient has been provided with contact information for the care management team and has been advised to call with any health related questions or concerns.  The care management team will reach out to the patient again over the next 30 days.  Evidence-based guidance:   Review biopsychosocial determinants of health screens.  Review need for preventive screening based on age, sex, family history and health history.  Determine level of modifiable health risk.   Discuss identified risks.  Identify areas where behavior change may lead to improved health.  Promote healthy lifestyle.  Evoke change talk using open-ended questions, pros and cons, as well as looking forward.  Identify and  manage conditions or preconditions to reduce health risk.  Implement additional goals and interventions based on identified risk factors.

## 2021-03-21 NOTE — Patient Outreach (Addendum)
Medicaid Managed Care   Nurse Care Manager Note  03/21/2021 Name:  Sara Munoz MRN:  [id number] DOB:  1966/03/26  Sara Munoz is an 55 y.o. year old female who is a primary patient of [name], [place].  The [facility] Managed Care Coordination team was consulted for assistance with:    Chronic healthcare management needs.  Sara Munoz was given information about Medicaid Managed Care Coordination team services today. Sara Munoz Patient agreed to services and verbal consent obtained.  Engaged with patient by telephone for follow up visit in response to provider referral for case management and/or care coordination services.   Assessments/Interventions:  Review of past medical history, allergies, medications, health status, including review of consultants reports, laboratory and other test data, was performed as part of comprehensive evaluation and provision of chronic care management services.  SDOH (Social Determinants of Health) assessments and interventions performed: SDOH Interventions    Flowsheet Row Most Recent Value  SDOH Interventions   Food Insecurity Interventions Intervention Not Indicated       Care Plan  Allergies  Allergen Reactions   Flexeril [Cyclobenzaprine] Hives and Swelling    Facial/lip swelling      Levofloxacin Hives and Swelling   Phenergan [Promethazine Hcl] Other (See Comments)    Agitation.    Toradol [Ketorolac Tromethamine] Swelling and Other (See Comments)    Facial/tongue swelling    Tramadol Hives, Swelling and Other (See Comments)    Lip swelling    Zoloft [Sertraline Hcl] Swelling    Tongue swelling       Medications Reviewed Today     Reviewed by [name], RN (Registered Nurse) on 03/21/21 at 31  Med List Status: <None>   Medication Order Taking? Sig Documenting Provider Last Dose Status Informant  albuterol (PROVENTIL) (2.5 MG/3ML) 0.083% nebulizer solution [id number]  Take 3 mLs (2.5 mg total) by nebulization  every 6 (six) hours as needed for wheezing or shortness of breath.  Patient taking differently: Take 2.5 mg by nebulization 2 (two) times daily.   [name], MD  Active   amLODipine (NORVASC) 5 MG tablet [id number]  TAKE ONE TABLET BY MOUTH ONCE DAILY Brita Romp Dionne Bucy, MD  Active   atorvastatin (LIPITOR) 20 MG tablet [id number]  TAKE ONE TABLET BY MOUTH EVERY EVENING Bacigalupo, Dionne Bucy, MD  Active   buprenorphine-naloxone (SUBOXONE) 8-2 mg SUBL SL tablet [id number]  Place 1 tablet under the tongue daily. [provider]  Active Self  cyclobenzaprine (FLEXERIL) 10 MG tablet [id number]  Take 10 mg by mouth 2 (two) times daily as needed for muscle spasms. [provider]  Active Self  docusate sodium (COLACE) 100 MG capsule [id number]  Take 200 mg by mouth daily as needed for moderate constipation.  [provider]  Active Self  DULoxetine (CYMBALTA) 60 MG capsule [id number]  Take 1 capsule (60 mg total) by mouth daily. Please call [phone] to schedule appt or may request future refills from PCP.  Patient not taking: No sig reported   [name], MD  Active   gabapentin (NEURONTIN) 600 MG tablet [id number]  Take 600 mg by mouth 4 (four) times daily. [provider]  Active Self  hydrOXYzine (ATARAX/VISTARIL) 50 MG tablet [id number]  Take 1 tablet (50 mg total) by mouth 3 (three) times daily as needed. Sable Feil, [name]  Active   linaclotide Rolan Lipa) 290 MCG CAPS capsule [id number]  Take 1 capsule (290 mcg total) by mouth daily before breakfast.  [name], MD  Active   lisinopril (ZESTRIL) 40 MG tablet [id number]  TAKE ONE TABLET BY MOUTH EVERY EVENING Brita Romp Dionne Bucy, MD  Active   meloxicam (MOBIC) 15 MG tablet [id number]  Take 15 mg by mouth daily. [provider]  Active Self  metaxalone (SKELAXIN) 800 MG tablet [id number]  Take 800 mg by mouth 3 (three) times daily. [provider]  Active   methocarbamol (ROBAXIN) 750 MG tablet  [id number]  Take 1 tablet (750 mg total) by mouth every 6 (six) hours as needed for muscle spasms. Sable Feil, [name]  Active   methylPREDNISolone (MEDROL DOSEPAK) 4 MG TBPK tablet [id number]  Take Tapered dose as directed Sable Feil, [name]  Active   metoprolol tartrate (LOPRESSOR) 25 MG tablet [id number]  Take 1 tablet (25 mg total) by mouth 2 (two) times daily. [name], [name], [name]  Active   naloxone [facility]) 4 MG/0.1ML LIQD nasal spray kit [id number]  Place 1 spray into the nose as needed (opioid overdose). [provider]  Active Self           Med Note [name]   Wed May 10, 2020 12:59 PM) Has both the nasal spray and injection  Naloxone HCl (NARCAN IJ) [id number]  Inject 1 Dose as directed as needed (opioid overdose). [provider]  Active Self           Med Note [name]   Wed May 10, 2020 12:59 PM) Has both the nasal spray and injection  nitroGLYCERIN (NITROSTAT) 0.4 MG SL tablet [id number]  Place 1 tablet (0.4 mg total) under the tongue every 5 (five) minutes x 3 doses as needed for chest pain. [name], [name], [name]  Active   omeprazole (PRILOSEC) 40 MG capsule [id number] Yes Take 1 capsule (40 mg total) by mouth 2 (two) times daily. [name], MD Taking Active   omeprazole (PRILOSEC) 40 MG capsule [id number]  Take 1 capsule (40 mg total) by mouth 2 (two) times daily. [name], MD  Expired 02/11/21 2359   ondansetron (ZOFRAN) 4 MG tablet [id number]  Take 1 tablet (4 mg total) by mouth every 8 (eight) hours as needed for nausea or vomiting. [name], MD  Active   ondansetron (ZOFRAN-ODT) 8 MG disintegrating tablet [id number]  Take 1 tablet (8 mg total) by mouth 2 (two) times daily. [name], MD  Active   oxyCODONE (ROXICODONE) 5 MG immediate release tablet [id number]  Take 4 tablets (20 mg total) by mouth every 6 (six) hours as needed (pain). Judith Part, MD  Active   PROAIR HFA 108 [phone] Base) MCG/ACT inhaler [id number]  INHALE TWO PUFFS BY  MOUTH INTO LUNGS EVERY 6 HOURS AS NEEDED FOR SHORTNESS OF BREATH OR wheezing Brita Romp Dionne Bucy, MD  Active   SPIRIVA RESPIMAT 2.5 MCG/ACT AERS [id number]  INHALE 1 PUFF BY MOUTH INTO LUNGS ONCE DAILY Bacigalupo, Dionne Bucy, MD  Active   sucralfate (CARAFATE) 1 GM/10ML suspension [id number]  TAKE 10 MLS (1 G TOTAL) BY MOUTH 4 (FOUR) TIMES DAILY. [name], MD  Active   [facility] 160-4.5 MCG/ACT inhaler [id number]  INHALE TWO PUFFS BY MOUTH INTO LUNGS TWICE DAILY Brita Romp Dionne Bucy, MD  Active             Patient Active Problem List   Diagnosis Date Noted   Lumbar radiculopathy 05/23/2020   Sepsis ([place]) 10/19/2018   Severe sepsis ([place]) 10/19/2018   Cervical radiculopathy 02/12/2018   Chronic migraine 02/12/2018  Paresthesia 08/29/2017   Neck pain 08/29/2017   Daily headache 08/29/2017   Chronic active hepatitis ([place]) 07/29/2017   Neurogenic pain 07/31/2016   Chronic pain syndrome 06/05/2016   Carotid artery stenosis 02/27/2016   Chronic constipation 02/27/2016   Chronic nausea 02/27/2016   Hypercholesterolemia 02/27/2016   Osteoarthritis 02/27/2016   PTSD (post-traumatic stress disorder) 02/27/2016   TIA (transient ischemic attack) 02/27/2016   Long term current use of opiate analgesic 02/27/2016   Long term prescription opiate use 02/27/2016   Encounter for pain management consult 02/27/2016   Chronic hip pain (Location of Tertiary source of pain) (Bilateral) (L>R) 02/27/2016   Chronic knee pain (Bilateral) (L>R) 02/27/2016   Chronic shoulder pain (Bilateral) (L>R) 02/27/2016   Chronic sacroiliac joint pain (Bilateral) (L>R) 02/27/2016   Chronic low back pain (Location of Primary Source of Pain) (Bilateral) (L>R) 02/27/2016   Chronic lower extremity pain (Location of Secondary source of pain) (Bilateral) (L>R) 02/27/2016   Osteoarthritis of hip (Bilateral) (L>R) 02/27/2016   Chronic neck pain (posterior midline) (Bilateral) (L>R) 02/27/2016   Cervicogenic headache  (Bilateral) (L>R) 02/27/2016   Occipital headache (Bilateral) (L>R) 02/27/2016   Chronic upper extremity pain (Bilateral) (L>R) 02/27/2016   Chronic Cervical radicular pain (Bilateral) (L>R) 02/27/2016   Chronic lumbar radicular pain (Right) (L5 dermatome) 02/27/2016   Lumbar facet syndrome (Bilateral) (L>R) 02/27/2016   Long term prescription benzodiazepine use 02/27/2016   Hypertension    Chronic obstructive pulmonary disease ([place]) 10/09/2015   GERD (gastroesophageal reflux disease) 10/09/2015   Non-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/2014 08/02/2015   Hyperlipidemia 08/02/2015   Costochondritis 08/02/2015   Drug abuse, 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[place]) 09/03/2014   GAD (generalized anxiety disorder) 09/03/2014   Narcotic dependence ([place]) 09/03/2014   PVD (peripheral vascular disease) ([place]) 09/03/2014   Smoker 09/03/2014   Cigarette nicotine dependence with nicotine-induced disorder 07/08/2014   Anemia of chronic disease 03/19/2014   Narcotic abuse, continuous ([place]) 03/19/2014   Polysubstance abuse ([place]) 03/19/2014   MSSA (methicillin susceptible Staphylococcus aureus) septicemia ([place]) 03/07/2014   Hypomagnesemia 01/04/2014   Chronic cough 09/04/2011   Sleep apnea, obstructive 09/04/2011    Conditions to be addressed/monitored per PCP order:   chronic healthcare management needs,  CAD, HTN, PVD, TIA, migraines, copd, OSA, GERD hepatitis, tobacco use, ostoarthritis, chronic pain, PTSD  Care Plan : General Plan of Care (Adult)  Updates made by [name], RN since 03/21/2021 12:00 AM     Problem: Health Promotion or Disease Self-Management (General Plan of Care)   Priority: High  Onset Date: 12/20/2020     Long-Range Goal: Self-Management Plan Developed   Start Date: 12/20/2020  Expected End Date: 05/24/2021  Recent Progress: Not on track  Priority: High  Note:     Current Barriers:  Knowledge Deficits related to short term plan for care coordination needs and long term plans for chronic disease  management needs.  Patient with many chronic health conditions-CAD, HTN, PVD, TIA, migraines, COPD, OSA, GERD, hepatitis, tobacco use, osteoarthritis, chronic pain, anxiety, poor appetite, constipation Nurse Case Manager Clinical Goal(s):  patient will work with care management team to address care coordination and chronic disease management needs related to Disease Management Educational Needs Care Coordination Medication Management and Education Medication Reconciliation Medication Assistance  Psychosocial Support Mental Health Counseling   Interventions:  Evaluation of current treatment plan  and patient's adherence to plan as established by provider. Advised patient to contact PCP regarding blood pressure. Update 01/22/21:  Patient has appointment with PCP 01/23/21. Update  02/22/21:  Patient seen and evaluated by PCP 01/23/21.  Patient checks blood pressure twice a week-WNL per patient, although could not give readings. Update 03/21/21:  Blood pressure 120/90. Reviewed medications with patient. Collaborated with pharmacy regarding medications. Update 02/22/21:  Pharmacy following patient.  Collaborated with SW regarding anxiety. Update 02/22/21:  SW following patient. Discussed plans with patient for ongoing care management follow up and provided patient with direct contact information for care management team Update 02/22/21:  Discussed meal supplements. Update 03/21/21:  patient states she is eating cereal and ice cream, to schedule an appointment with provider to discuss appetite, supplements, and constipation.  Discussed adding fiber to diet. Advised patient, providing education and rationale, to monitor blood pressure daily and record, calling PCP for findings outside established parameters.  Reviewed scheduled/upcoming provider appointments. Social Work referral for anxiety, PTSD. Update 01/22/21:  Patient has met with SW and continues to follow-[facility] referral made-patient to call back and  schedule appointment. 03/21/21-speaking with counselor twice a week. Update 02/22/21:  Patient had virtual appointment with Psychiatrist 02/02/21-continues to follow once a month. Pharmacy referral for medication review. Update 01/22/21:  Patient has met with Pharmacist and continues to follow. Patient given 1-800-QUITNOW for smoking cessation if interested. Update 01/22/21:  Patient states she is smoking 1/2 ppd now and trying to quit-has patches to use if she chooses. Update 02/22/21:  Smoking 1/2-1ppd. Update 03/21/21:  Smokes 1ppd, has not tried patches. Self Care Activities:  Patient will self administer medications as prescribed Patient will attend all scheduled provider appointments Patient will call pharmacy for medication refills Patient will continue to perform ADL's independently Patient will call provider office for new concerns or questions Patient Goals:  In the next 30 days, patient will call [facility] back to schedule appointment for anxiety/PTSD. Update 03/21/21:  Patient talks to counselor twice a week and sees Psychiatrist once a month. In the next 90 days, patient will attend all scheduled appointments. - Follow Up Plan: The patient has been provided with contact information for the care management team and has been advised to call with any health related questions or concerns.  The care management team will reach out to the patient again over the next 30 days.  Evidence-based guidance:   Review biopsychosocial determinants of health screens.  Review need for preventive screening based on age, sex, family history and health history.  Determine level of modifiable health risk.   Discuss identified risks.  Identify areas where behavior change may lead to improved health.  Promote healthy lifestyle.  Evoke change talk using open-ended questions, pros and cons, as well as looking forward.  Identify and manage conditions or preconditions to reduce health risk.  Implement additional  goals and interventions based on identified risk factors.      Follow Up:  Patient agrees to Care Plan and Follow-up.  Plan: The Managed Medicaid care management team will reach out to the patient again over the next 30 days. and The patient has been provided with contact information for the Managed Medicaid care management team and has been advised to call with any health related questions or concerns.  Date/time of next scheduled RN care management/care coordination outreach:  04/20/21 at 1030.

## 2021-03-21 NOTE — Telephone Encounter (Signed)
Med refill  Ondansetron (Tablet Dispersible) ZOFRAN-ODT 8 MG

## 2021-03-23 ENCOUNTER — Other Ambulatory Visit: Payer: Self-pay | Admitting: Gastroenterology

## 2021-03-23 ENCOUNTER — Telehealth: Payer: Self-pay | Admitting: Physician Assistant

## 2021-03-23 ENCOUNTER — Other Ambulatory Visit: Payer: Self-pay

## 2021-03-23 DIAGNOSIS — I10 Essential (primary) hypertension: Secondary | ICD-10-CM

## 2021-03-23 DIAGNOSIS — R110 Nausea: Secondary | ICD-10-CM

## 2021-03-23 MED ORDER — ONDANSETRON 8 MG PO TBDP: 8.0000 mg | ORAL_TABLET | Freq: Two times a day (BID) | ORAL | 1 refills | Status: DC

## 2021-03-23 MED ORDER — METOPROLOL TARTRATE 25 MG PO TABS: 25.0000 mg | ORAL_TABLET | Freq: Two times a day (BID) | ORAL | 5 refills | Status: DC

## 2021-03-23 NOTE — Telephone Encounter (Signed)
[facility] faxed refill request for the following medications:    metoprolol tartrate (LOPRESSOR) 25 MG tablet   Please advise.

## 2021-03-23 NOTE — Telephone Encounter (Signed)
Patient was seen by Vernie Murders, PA on 01/23/2021. Refilled medication.

## 2021-03-23 NOTE — Telephone Encounter (Signed)
Medication was refilled.

## 2021-03-27 DIAGNOSIS — I10 Essential (primary) hypertension: Secondary | ICD-10-CM | POA: Diagnosis not present

## 2021-03-27 DIAGNOSIS — Z6832 Body mass index (BMI) 32.0-32.9, adult: Secondary | ICD-10-CM | POA: Diagnosis not present

## 2021-03-27 DIAGNOSIS — M47816 Spondylosis without myelopathy or radiculopathy, lumbar region: Secondary | ICD-10-CM | POA: Diagnosis not present

## 2021-03-29 DIAGNOSIS — G894 Chronic pain syndrome: Secondary | ICD-10-CM | POA: Diagnosis not present

## 2021-03-29 DIAGNOSIS — Z79891 Long term (current) use of opiate analgesic: Secondary | ICD-10-CM | POA: Diagnosis not present

## 2021-03-30 DIAGNOSIS — F411 Generalized anxiety disorder: Secondary | ICD-10-CM | POA: Diagnosis not present

## 2021-03-30 DIAGNOSIS — G4700 Insomnia, unspecified: Secondary | ICD-10-CM | POA: Diagnosis not present

## 2021-03-31 ENCOUNTER — Ambulatory Visit
Admission: RE | Admit: 2021-03-31 | Discharge: 2021-03-31 | Disposition: A | Discharge status: Home / Self Care | Payer: Medicaid Other | Source: Ambulatory Visit | Attending: Neurological Surgery | Admitting: Neurological Surgery

## 2021-03-31 DIAGNOSIS — M5021 Other cervical disc displacement,  high cervical region: Secondary | ICD-10-CM | POA: Diagnosis not present

## 2021-03-31 DIAGNOSIS — M5412 Radiculopathy, cervical region: Secondary | ICD-10-CM

## 2021-03-31 DIAGNOSIS — Z981 Arthrodesis status: Secondary | ICD-10-CM | POA: Diagnosis not present

## 2021-03-31 DIAGNOSIS — M4322 Fusion of spine, cervical region: Secondary | ICD-10-CM | POA: Diagnosis not present

## 2021-03-31 DIAGNOSIS — G8929 Other chronic pain: Secondary | ICD-10-CM | POA: Diagnosis not present

## 2021-04-09 ENCOUNTER — Other Ambulatory Visit: Payer: Self-pay

## 2021-04-10 NOTE — Patient Outreach (Signed)
[facility]) Care Management  04/10/2021  KERILYN CORTNER 20-Dec-1965 [id number]  LCSW completed [facility] outreach attempt today but was unable to reach patient successfully. A HIPPA compliant voice message was left encouraging patient to return call once available. LCSW will ask Scheduling Care Guide to reschedule [facility] SW appointment with patient as well.  [name], BSW, MSW, [employer] Managed Medicaid [email or web address] [email or web address] Phone: [phone]

## 2021-04-10 NOTE — Patient Instructions (Signed)
[name] ,   [facility] Managed Care Team is available to provide assistance to you with your healthcare needs at no cost and as a benefit of your [facility] Health plan. I'm sorry I was unable to reach you today for our scheduled appointment. Our care guide will call you to reschedule our telephone appointment. Please call me at the number below. I am available to be of assistance to you regarding your healthcare needs. .   Thank you,   [name], BSW, MSW, LCSW Managed Medicaid [email or web address] Phone: [phone]

## 2021-04-11 ENCOUNTER — Other Ambulatory Visit: Payer: Self-pay | Admitting: Physician Assistant

## 2021-04-11 ENCOUNTER — Other Ambulatory Visit: Payer: Self-pay

## 2021-04-11 DIAGNOSIS — J432 Centrilobular emphysema: Secondary | ICD-10-CM

## 2021-04-11 DIAGNOSIS — I10 Essential (primary) hypertension: Secondary | ICD-10-CM

## 2021-04-11 DIAGNOSIS — E785 Hyperlipidemia, unspecified: Secondary | ICD-10-CM

## 2021-04-11 MED ORDER — ONDANSETRON HCL 4 MG PO TABS: 4.0000 mg | ORAL_TABLET | Freq: Three times a day (TID) | ORAL | 0 refills | Status: DC | PRN

## 2021-04-11 NOTE — Telephone Encounter (Signed)
Please review.  Pt has not had her lab work completed. Is it okay to refill her prescriptions?   Thanks,   -Mickel Baas

## 2021-04-11 NOTE — Telephone Encounter (Signed)
[facility] faxed refill request for the following medications:   amLODipine (NORVASC) 5 MG tablet   lisinopril (ZESTRIL) 40 MG tablet   SYMBICORT 160-4.5 MCG/ACT inhaler   SPIRIVA RESPIMAT 2.5 MCG/ACT AERS    PROAIR HFA 108 (90 Base) MCG/ACT inhaler   atorvastatin (LIPITOR) 20 MG tablet   Please advise.

## 2021-04-12 ENCOUNTER — Telehealth: Payer: Self-pay | Admitting: Physician Assistant

## 2021-04-12 NOTE — Telephone Encounter (Signed)
..   Medicaid Managed Care   Unsuccessful Outreach Note  04/12/2021 Name: Sara Munoz MRN: [id number] DOB: 1966-05-01  Referred by: [name], [name] (Inactive) Reason for referral : High Risk Managed Medicaid (I called the patient today to get her phone visit with the [facility] LCSW rescheduled. I left my name and number on her VM.)   An unsuccessful telephone outreach was attempted today. The patient was referred to the case management team for assistance with care management and care coordination.   Follow Up Plan: The care management team will reach out to the patient again over the next 7 days.   [place]

## 2021-04-18 DIAGNOSIS — Z6834 Body mass index (BMI) 34.0-34.9, adult: Secondary | ICD-10-CM | POA: Diagnosis not present

## 2021-04-18 DIAGNOSIS — I10 Essential (primary) hypertension: Secondary | ICD-10-CM | POA: Diagnosis not present

## 2021-04-18 DIAGNOSIS — S129XXA Fracture of neck, unspecified, initial encounter: Secondary | ICD-10-CM | POA: Diagnosis not present

## 2021-04-18 MED ORDER — ALBUTEROL SULFATE HFA 108 (90 BASE) MCG/ACT IN AERS: INHALATION_SPRAY | RESPIRATORY_TRACT | 0 refills | Status: DC

## 2021-04-18 MED ORDER — AMLODIPINE BESYLATE 5 MG PO TABS: 5.0000 mg | ORAL_TABLET | Freq: Every day | ORAL | 0 refills | Status: DC

## 2021-04-18 MED ORDER — LISINOPRIL 40 MG PO TABS: 40.0000 mg | ORAL_TABLET | Freq: Every evening | ORAL | 0 refills | Status: DC

## 2021-04-18 MED ORDER — BUDESONIDE-FORMOTEROL FUMARATE 160-4.5 MCG/ACT IN AERO: 2.0000 | INHALATION_SPRAY | Freq: Two times a day (BID) | RESPIRATORY_TRACT | 0 refills | Status: DC

## 2021-04-18 MED ORDER — ATORVASTATIN CALCIUM 20 MG PO TABS: 20.0000 mg | ORAL_TABLET | Freq: Every evening | ORAL | 0 refills | Status: DC

## 2021-04-20 ENCOUNTER — Other Ambulatory Visit: Payer: Self-pay

## 2021-04-20 ENCOUNTER — Other Ambulatory Visit: Payer: Self-pay | Admitting: Obstetrics and Gynecology

## 2021-04-20 ENCOUNTER — Other Ambulatory Visit: Payer: Self-pay | Admitting: Licensed Clinical Social Worker

## 2021-04-20 NOTE — Patient Outreach (Signed)
Medicaid Managed Care   Nurse Care Manager Note  04/20/2021 Name:  Sara Munoz MRN:  [id number] DOB:  1965-09-27  Sara Munoz is an 55 y.o. year old female who is Munoz primary patient of Sara Post, [name] (Inactive).  The [facility] Managed Care Coordination team was consulted for assistance with:    Chronic healthcare management needs.  Sara Munoz was given information about Medicaid Managed Care Coordination team services today. Sara Munoz Patient agreed to services and verbal consent obtained.  Engaged with patient by telephone for follow up visit in response to provider referral for case management and/or care coordination services.   Assessments/Interventions:  Review of past medical history, allergies, medications, health status, including review of consultants reports, laboratory and other test data, was performed as part of comprehensive evaluation and provision of chronic care management services.  SDOH (Social Determinants of Health) assessments and interventions performed: SDOH Interventions    Flowsheet Row Most Recent Value  SDOH Interventions   Financial Strain Interventions Intervention Not Indicated, Other (Comment)  [pays all of her bills]  Intimate Partner Violence Interventions Intervention Not Indicated       Care Plan  Allergies  Allergen Reactions   Flexeril [Cyclobenzaprine] Hives and Swelling    Facial/lip swelling      Levofloxacin Hives and Swelling   Phenergan [Promethazine Hcl] Other (See Comments)    Agitation.    Toradol [Ketorolac Tromethamine] Swelling and Other (See Comments)    Facial/tongue swelling    Tramadol Hives, Swelling and Other (See Comments)    Lip swelling    Zoloft [Sertraline Hcl] Swelling    Tongue swelling       Medications Reviewed Today     Reviewed by Sara Munoz (Registered Nurse) on 04/20/21 at 74  Med List Status: <None>   Medication Order Taking? Sig Documenting Provider Last Dose Status  Informant  albuterol (PROAIR HFA) 108 (90 Base) MCG/ACT inhaler [id number]  INHALE TWO PUFFS BY MOUTH INTO LUNGS EVERY 6 HOURS AS NEEDED FOR SHORTNESS OF BREATH OR wheezing Jerrol Banana., MD  Active   albuterol (PROVENTIL) (2.5 MG/3ML) 0.083% nebulizer solution [id number] No Take 3 mLs (2.5 mg total) by nebulization every 6 (six) hours as needed for wheezing or shortness of breath.  Patient taking differently: Take 2.5 mg by nebulization 2 (two) times daily.   Sara Pita, MD Taking Active   amLODipine (NORVASC) 5 MG tablet [id number]  Take 1 tablet (5 mg total) by mouth daily. Jerrol Banana., MD  Active   atorvastatin (LIPITOR) 20 MG tablet [id number]  Take 1 tablet (20 mg total) by mouth every evening. Jerrol Banana., MD  Active   budesonide-formoterol [facility]) 160-4.5 MCG/ACT inhaler [id number]  Inhale 2 puffs into the lungs in the morning and at bedtime. Jerrol Banana., MD  Active   buprenorphine-naloxone (SUBOXONE) 8-2 mg SUBL SL tablet [id number]  Place 1 tablet under the tongue daily. [provider]  Active Self  cyclobenzaprine (FLEXERIL) 10 MG tablet [id number]  Take 10 mg by mouth 2 (two) times daily as needed for muscle spasms. [provider]  Active Self  docusate sodium (COLACE) 100 MG capsule [id number] No Take 200 mg by mouth daily as needed for moderate constipation.  [provider] Taking Active Self  DULoxetine (CYMBALTA) 60 MG capsule [id number] No Take 1 capsule (60 mg total) by mouth daily. Please call [phone] to schedule appt or may request future refills  from PCP.  Patient not taking: No sig reported   [name], MD Not Taking Active   gabapentin (NEURONTIN) 600 MG tablet [id number] No Take 600 mg by mouth 4 (four) times daily. [provider] Taking Active Self  hydrOXYzine (ATARAX/VISTARIL) 50 MG tablet [id number] No Take 1 tablet (50 mg total) by mouth 3 (three) times daily as needed. Sable Feil,  [name] Taking Active   linaclotide [facility]) 290 MCG CAPS capsule [id number]  Take 1 capsule (290 mcg total) by mouth daily before breakfast. [name], MD  Active   lisinopril (ZESTRIL) 40 MG tablet [id number]  Take 1 tablet (40 mg total) by mouth every evening. Jerrol Banana., MD  Active   meloxicam [facility]) 15 MG tablet [id number]  Take 15 mg by mouth daily. [provider]  Active Self  metaxalone (SKELAXIN) 800 MG tablet [id number] No Take 800 mg by mouth 3 (three) times daily. [provider] Not Taking Active   methocarbamol (ROBAXIN) 750 MG tablet [id number] No Take 1 tablet (750 mg total) by mouth every 6 (six) hours as needed for muscle spasms. Sable Feil, [name] Not Taking Active   methylPREDNISolone (MEDROL DOSEPAK) 4 MG TBPK tablet [id number] No Take Tapered dose as directed Sable Feil, [name] Not Taking Active   metoprolol tartrate (LOPRESSOR) 25 MG tablet [id number]  Take 1 tablet (25 mg total) by mouth 2 (two) times daily. Jerrol Banana., MD  Active   naloxone [facility]) 4 MG/0.1ML LIQD nasal spray kit [id number] No Place 1 spray into the nose as needed (opioid overdose). [provider] Taking Active Self           Med Note Sara Munoz   Wed May 10, 2020 12:59 PM) Has both the nasal spray and injection  Naloxone HCl (NARCAN IJ) [id number] No Inject 1 Dose as directed as needed (opioid overdose). [provider] Taking Active Self           Med Note Sara Munoz   Wed May 10, 2020 12:59 PM) Has both the nasal spray and injection  nitroGLYCERIN (NITROSTAT) 0.4 MG SL tablet [id number]  Place 1 tablet (0.4 mg total) under the tongue every 5 (five) minutes x 3 doses as needed for chest pain. [name], [name], [name]  Active   omeprazole (PRILOSEC) 40 MG capsule [id number] No Take 1 capsule (40 mg total) by mouth 2 (two) times daily. [name], MD Taking Expired 02/11/21 2359   omeprazole (PRILOSEC) 40 MG capsule [id number]  TAKE 1  CAPSULE BY MOUTH TWICE Munoz DAY [name], MD  Active   ondansetron (ZOFRAN) 4 MG tablet [id number]  Take 1 tablet (4 mg total) by mouth every 8 (eight) hours as needed for nausea or vomiting. [name], MD  Active   ondansetron (ZOFRAN-ODT) 8 MG disintegrating tablet [id number]  Take 1 tablet (8 mg total) by mouth 2 (two) times daily. [name], MD  Active   oxyCODONE (ROXICODONE) 5 MG immediate release tablet [id number] No Take 4 tablets (20 mg total) by mouth every 6 (six) hours as needed (pain). Judith Part, MD Not Taking Active   SPIRIVA RESPIMAT 2.5 MCG/ACT AERS [id number]  INHALE 1 PUFF BY MOUTH INTO LUNGS ONCE DAILY Bacigalupo, Dionne Bucy, MD  Active   sucralfate (CARAFATE) 1 GM/10ML suspension [id number]  TAKE 10 MLS (1 G TOTAL) BY MOUTH 4 (FOUR) TIMES DAILY. [name], MD  Active  Patient Active Problem List   Diagnosis Date Noted   Lumbar radiculopathy 05/23/2020   Sepsis ([place]) 10/19/2018   Severe sepsis ([place]) 10/19/2018   Cervical radiculopathy 02/12/2018   Chronic migraine 02/12/2018   Paresthesia 08/29/2017   Neck pain 08/29/2017   Daily headache 08/29/2017   Chronic active hepatitis ([place]) 07/29/2017   Neurogenic pain 07/31/2016   Chronic pain syndrome 06/05/2016   Carotid artery stenosis 02/27/2016   Chronic constipation 02/27/2016   Chronic nausea 02/27/2016   Hypercholesterolemia 02/27/2016   Osteoarthritis 02/27/2016   PTSD (Munoz-traumatic stress disorder) 02/27/2016   TIA (transient ischemic attack) 02/27/2016   Long term current use of opiate analgesic 02/27/2016   Long term prescription opiate use 02/27/2016   Encounter for pain management consult 02/27/2016   Chronic hip pain (Location of Tertiary source of pain) (Bilateral) (L>R) 02/27/2016   Chronic knee pain (Bilateral) (L>R) 02/27/2016   Chronic shoulder pain (Bilateral) (L>R) 02/27/2016   Chronic sacroiliac joint pain (Bilateral) (L>R) 02/27/2016   Chronic low back pain (Location of  Primary Source of Pain) (Bilateral) (L>R) 02/27/2016   Chronic lower extremity pain (Location of Secondary source of pain) (Bilateral) (L>R) 02/27/2016   Osteoarthritis of hip (Bilateral) (L>R) 02/27/2016   Chronic neck pain (posterior midline) (Bilateral) (L>R) 02/27/2016   Cervicogenic headache (Bilateral) (L>R) 02/27/2016   Occipital headache (Bilateral) (L>R) 02/27/2016   Chronic upper extremity pain (Bilateral) (L>R) 02/27/2016   Chronic Cervical radicular pain (Bilateral) (L>R) 02/27/2016   Chronic lumbar radicular pain (Right) (L5 dermatome) 02/27/2016   Lumbar facet syndrome (Bilateral) (L>R) 02/27/2016   Long term prescription benzodiazepine use 02/27/2016   Hypertension    Chronic obstructive pulmonary disease ([place]) 10/09/2015   GERD (gastroesophageal reflux disease) 10/09/2015   Non-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/2014 08/02/2015   Hyperlipidemia 08/02/2015   Costochondritis 08/02/2015   Drug abuse, 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   GAD (generalized anxiety disorder) 09/03/2014   Narcotic dependence ([place]) 09/03/2014   PVD (peripheral vascular disease) ([place]) 09/03/2014   Smoker 09/03/2014   Cigarette nicotine dependence with nicotine-induced disorder 07/08/2014   Anemia of chronic disease 03/19/2014   Narcotic abuse, continuous ([place]) 03/19/2014   Polysubstance abuse ([place]) 03/19/2014   MSSA (methicillin susceptible Staphylococcus aureus) septicemia ([place]) 03/07/2014   Hypomagnesemia 01/04/2014   Chronic cough 09/04/2011   Sleep apnea, obstructive 09/04/2011    Conditions to be addressed/monitored per PCP order:   chronic healthcare management needs, tobacco use, chronic pain, HTN, migraines, COPD, OSA, GERD, PTSD, osteoarthritis, constipation, HLD  Care Plan : General Plan of Care (Adult)  Updates made by Sara Munoz since 04/20/2021 12:00 AM     Problem: Health Promotion or Disease Self-Management (General Plan of Care)   Priority: High  Onset Date: 12/20/2020      Long-Range Goal: Self-Management Plan Developed   Start Date: 12/20/2020  Expected End Date: 05/24/2021  Recent Progress: Not on track  Priority: High  Note:     Current Barriers:  Knowledge Deficits related to short term plan for care coordination needs and long term plans for chronic disease management needs.  Patient with many chronic health conditions-CAD, HTN, PVD, TIA, migraines, COPD, OSA, GERD, hepatitis, tobacco use, osteoarthritis, chronic pain, anxiety, poor appetite, constipation 04/20/21:  patient to have surgery for neck pain-waiting for MRI and surgery to be scheduled. Nurse Case Manager Clinical Goal(s):  patient will work with care management team to address care coordination and chronic disease management needs related to Disease Management Educational Needs Care Coordination Medication  Management and Education Medication Reconciliation Medication Assistance  Psychosocial Support Mental Health Counseling   Interventions:  Evaluation of current treatment plan  and patient's adherence to plan as established by provider. Advised patient to contact PCP regarding blood pressure. Update 01/22/21:  Patient has appointment with PCP 01/23/21. Update 02/22/21:  Patient seen and evaluated by PCP 01/23/21.  Patient checks blood pressure twice Munoz week-WNL per patient, although could not give readings. Update 03/21/21:  Blood pressure 120/90. Update 04/20/21:  BP stable, 120/70s-checks twice Munoz week Reviewed medications with patient. Collaborated with pharmacy regarding medications. Update 02/22/21:  Pharmacy following patient.  Collaborated with SW regarding anxiety. Update 02/22/21:  SW following patient. Discussed plans with patient for ongoing care management follow up and provided patient with direct contact information for care management team Update 02/22/21:  Discussed meal supplements. Update 03/21/21:  patient states she is eating cereal and ice cream, to schedule an appointment with  provider to discuss appetite, supplements, and constipation. Discussed adding fiber to diet. Update 04/20/21:  patient has appt with GI 05/21/21-no change. Advised patient, providing education and rationale, to monitor blood pressure daily and record, calling PCP for findings outside established parameters.  Reviewed scheduled/upcoming provider appointments. Social Work referral for anxiety, PTSD. Update 01/22/21:  Patient has met with SW and continues to follow-[facility] referral made-patient to call back and schedule appointment. 03/21/21-speaking with counselor twice Munoz week. Update 02/22/21:  Patient had virtual appointment with Psychiatrist 02/02/21-continues to follow once Munoz month. Pharmacy referral for medication review. Update 01/22/21:  Patient has met with Pharmacist and continues to follow. Patient given 1-800-QUITNOW for smoking cessation if interested. Update 01/22/21:  Patient states she is smoking 1/2 ppd now and trying to quit-has patches to use if she chooses. Update 02/22/21:  Smoking 1/2-1ppd. Update 03/21/21:  Smokes 1ppd, has not tried patches. Update 03/21/21:  No change. Self Care Activities:  Patient will self administer medications as prescribed Patient will attend all scheduled provider appointments Patient will call pharmacy for medication refills Patient will continue to perform ADL's independently Patient will call provider office for new concerns or questions Patient Goals:  In the next 30 days, patient will call [facility] back to schedule appointment for anxiety/PTSD. Update 03/21/21:  Patient talks to counselor twice Munoz week and sees Psychiatrist once Munoz month. In the next 90 days, patient will attend all scheduled appointments. - Follow Up Plan: The patient has been provided with contact information for the care management team and has been advised to call with any health related questions or concerns.  The care management team will reach out to the patient again over the next 30  days.  Evidence-based guidance:   Review biopsychosocial determinants of health screens.  Review need for preventive screening based on age, sex, family history and health history.  Determine level of modifiable health risk.   Discuss identified risks.  Identify areas where behavior change may lead to improved health.  Promote healthy lifestyle.  Evoke change talk using open-ended questions, pros and cons, as well as looking forward.  Identify and manage conditions or preconditions to reduce health risk.  Implement additional goals and interventions based on identified risk factors.     Follow Up:  Patient agrees to Care Plan and Follow-up.  Plan: The Managed Medicaid care management team will reach out to the patient again over the next 30 days. and The  Patient has been provided with contact information for the Managed Medicaid care management team and has been advised  to call with any health related questions or concerns.  Date/time of next scheduled Munoz care management/care coordination outreach:  05/18/21 at 230.

## 2021-04-20 NOTE — Patient Instructions (Signed)
Hi Sara Munoz, thanks for speaking with me today-I hope you feel better!  Sara Munoz was given information about Medicaid Managed Care team care coordination services as a part of their [place] Complete Medicaid benefit. Sara Munoz verbally consented to engagement with the [facility] Managed Care team.   If you are experiencing a medical emergency, please call 911 or report to your local emergency department or urgent care.   If you have a non-emergency medical problem during routine business hours, please contact your provider's office and ask to speak with a nurse.   For questions related to your [place] Complete Medicaid health plan, please call: [phone]  If you would like to schedule transportation through your [place] Complete Medicaid plan, please call the following number at least 2 days in advance of your appointment: [phone].   Call the [place] at [phone], at any time, 24 hours a day, 7 days a week. If you are in danger or need immediate medical attention call 911.  If you would like help to quit smoking, call 1-800-QUIT-NOW 239-207-4680) OR Espaol: 1-855-Djelo-Ya (2-248-250-0370) o para ms informacin haga clic aqu or Text READY to 200-400 to register via text  Sara Munoz - following are the goals we discussed in your visit today:   Goals Addressed             This Visit's Progress    Protect My Health       Timeframe:  Long-Range Goal Priority:  High Start Date:       12/20/20                      Expected End Date:    ongoing                   Follow Up Date: 05/21/21   - schedule appointment for flu shot - schedule appointment for vaccines needed due to my age or health - schedule recommended health tests (blood work, mammogram, colonoscopy, pap test) - schedule and keep appointment for annual check-up   Update 01/22/21:  Patient has follow up appointment with PCP 01/23/21. Update 02/22/21:  Patient seen and evaluated by  PCP 01/23/21.  Has appointment with neurosurgeon 03/24/21. Update 03/21/21:  Appointment with pain management 10/6, CT 10/8, Neurologist in November. Update 04/20/21: patient to have MRI and surgery for neck, back pain.  Has appt in Nov with GI and Neuro   Why is this important?   Screening tests can find diseases early when they are easier to treat.  Your doctor or nurse will talk with you about which tests are important for you.  Getting shots for common diseases like the flu and shingles will help prevent them.      The patient verbalized understanding of instructions provided today and declined a print copy of patient instruction materials.   The Managed Medicaid care management team will reach out to the patient again over the next 30 days.  The  Patient  has been provided with contact information for the Managed Medicaid care management team and has been advised to call with any health related questions or concerns.   [name] RN, BSN [place]  [facility]ator - Managed Medicaid High Risk [phone].   Following is a copy of your plan of care:  Patient Care Plan: General Plan of Care (Adult)     Problem Identified: Health Promotion or Disease Self-Management (General Plan of Care)  Priority: High  Onset Date: 12/20/2020     Long-Range Goal: Self-Management Plan Developed   Start Date: 12/20/2020  Expected End Date: 05/24/2021  Recent Progress: Not on track  Priority: High  Note:     Current Barriers:  Knowledge Deficits related to short term plan for care coordination needs and long term plans for chronic disease management needs.  Patient with many chronic health conditions-CAD, HTN, PVD, TIA, migraines, COPD, OSA, GERD, hepatitis, tobacco use, osteoarthritis, chronic pain, anxiety, poor appetite, constipation 04/20/21:  patient to have surgery for neck pain-waiting for MRI and surgery to be scheduled. Nurse Case Manager Clinical  Goal(s):  patient will work with care management team to address care coordination and chronic disease management needs related to Disease Management Educational Needs Care Coordination Medication Management and Education Medication Reconciliation Medication Assistance  Psychosocial Support Mental Health Counseling   Interventions:  Evaluation of current treatment plan  and patient's adherence to plan as established by provider. Advised patient to contact PCP regarding blood pressure. Update 01/22/21:  Patient has appointment with PCP 01/23/21. Update 02/22/21:  Patient seen and evaluated by PCP 01/23/21.  Patient checks blood pressure twice a week-WNL per patient, although could not give readings. Update 03/21/21:  Blood pressure 120/90. Update 04/20/21:  BP stable, 120/70s-checks twice a week Reviewed medications with patient. Collaborated with pharmacy regarding medications. Update 02/22/21:  Pharmacy following patient.  Collaborated with SW regarding anxiety. Update 02/22/21:  SW following patient. Discussed plans with patient for ongoing care management follow up and provided patient with direct contact information for care management team Update 02/22/21:  Discussed meal supplements. Update 03/21/21:  patient states she is eating cereal and ice cream, to schedule an appointment with provider to discuss appetite, supplements, and constipation. Discussed adding fiber to diet. Update 04/20/21:  patient has appt with GI 05/21/21-no change. Advised patient, providing education and rationale, to monitor blood pressure daily and record, calling PCP for findings outside established parameters.  Reviewed scheduled/upcoming provider appointments. Social Work referral for anxiety, PTSD. Update 01/22/21:  Patient has met with SW and continues to follow-[facility] referral made-patient to call back and schedule appointment. 03/21/21-speaking with counselor twice a week. Update 02/22/21:  Patient had virtual  appointment with Psychiatrist 02/02/21-continues to follow once a month. Pharmacy referral for medication review. Update 01/22/21:  Patient has met with Pharmacist and continues to follow. Patient given 1-800-QUITNOW for smoking cessation if interested. Update 01/22/21:  Patient states she is smoking 1/2 ppd now and trying to quit-has patches to use if she chooses. Update 02/22/21:  Smoking 1/2-1ppd. Update 03/21/21:  Smokes 1ppd, has not tried patches. Update 03/21/21:  No change. Self Care Activities:  Patient will self administer medications as prescribed Patient will attend all scheduled provider appointments Patient will call pharmacy for medication refills Patient will continue to perform ADL's independently Patient will call provider office for new concerns or questions Patient Goals:  In the next 30 days, patient will call [facility] back to schedule appointment for anxiety/PTSD. Update 03/21/21:  Patient talks to counselor twice a week and sees Psychiatrist once a month. In the next 90 days, patient will attend all scheduled appointments. - Follow Up Plan: The patient has been provided with contact information for the care management team and has been advised to call with any health related questions or concerns.  The care management team will reach out to the patient again over the next 30 days.  Evidence-based guidance:   Review biopsychosocial determinants of health screens.  Review need for preventive screening based on age, sex, family history and health history.  Determine level of modifiable health risk.   Discuss identified risks.  Identify areas where behavior change may lead to improved health.  Promote healthy lifestyle.  Evoke change talk using open-ended questions, pros and cons, as well as looking forward.  Identify and manage conditions or preconditions to reduce health risk.  Implement additional goals and interventions based on identified risk factors.

## 2021-04-20 NOTE — Patient Outreach (Signed)
Medicaid Managed Care Social Work Note  04/20/2021 Name:  Sara Munoz MRN:  [id number] DOB:  09-Dec-1965  Sara Munoz is an 55 y.o. year old female who is a primary patient of [name], [name] (Inactive).  The Medicaid Managed Care Coordination team was consulted for assistance with:  [place] and Resources  Sara Munoz was given information about Medicaid Managed Care Coordination team services today. [name] Patient agreed to services and verbal consent obtained.  Engaged with patient  for by telephone forfollow up visit in response to referral for case management and/or care coordination services.   Assessments/Interventions:  Review of past medical history, allergies, medications, health status, including review of consultants reports, laboratory and other test data, was performed as part of comprehensive evaluation and provision of chronic care management services.  SDOH: (Social Determinant of Health) assessments and interventions performed:   Advanced Directives Status:  See Care Plan for related entries.  Care Plan                 Allergies  Allergen Reactions   Flexeril [Cyclobenzaprine] Hives and Swelling    Facial/lip swelling      Levofloxacin Hives and Swelling   Phenergan [Promethazine Hcl] Other (See Comments)    Agitation.    Toradol [Ketorolac Tromethamine] Swelling and Other (See Comments)    Facial/tongue swelling    Tramadol Hives, Swelling and Other (See Comments)    Lip swelling    Zoloft [Sertraline Hcl] Swelling    Tongue swelling       Medications Reviewed Today     Reviewed by [name], RN (Registered Nurse) on 04/20/21 at 5  Med List Status: <None>   Medication Order Taking? Sig Documenting Provider Last Dose Status Informant  albuterol (PROAIR HFA) 108 (90 Base) MCG/ACT inhaler [id number]  INHALE TWO PUFFS BY MOUTH INTO LUNGS EVERY 6 HOURS AS NEEDED FOR SHORTNESS OF BREATH OR wheezing Jerrol Banana., MD  Active   albuterol (PROVENTIL) (2.5 MG/3ML) 0.083% nebulizer solution [id number] No Take 3 mLs (2.5 mg total) by nebulization every 6 (six) hours as needed for wheezing or shortness of breath.  Patient taking differently: Take 2.5 mg by nebulization 2 (two) times daily.   [name], MD Taking Active   amLODipine (NORVASC) 5 MG tablet [id number]  Take 1 tablet (5 mg total) by mouth daily. Jerrol Banana., MD  Active   atorvastatin (LIPITOR) 20 MG tablet [id number]  Take 1 tablet (20 mg total) by mouth every evening. Jerrol Banana., MD  Active   budesonide-formoterol [facility]) 160-4.5 MCG/ACT inhaler [id number]  Inhale 2 puffs into the lungs in the morning and at bedtime. Jerrol Banana., MD  Active   buprenorphine-naloxone (SUBOXONE) 8-2 mg SUBL SL tablet [id number]  Place 1 tablet under the tongue daily. [provider]  Active Self  cyclobenzaprine (FLEXERIL) 10 MG tablet [id number]  Take 10 mg by mouth 2 (two) times daily as needed for muscle spasms. [provider]  Active Self  docusate sodium (COLACE) 100 MG capsule [id number] No Take 200 mg by mouth daily as needed for moderate constipation.  [provider] Taking Active Self  DULoxetine (CYMBALTA) 60 MG capsule [id number] No Take 1 capsule (60 mg total) by mouth daily. Please call [phone] to schedule appt or may request future refills from PCP.  Patient not taking: No sig reported   [name], MD Not Taking Active  gabapentin (NEURONTIN) 600 MG tablet [id number] No Take 600 mg by mouth 4 (four) times daily. [provider] Taking Active Self  hydrOXYzine (ATARAX/VISTARIL) 50 MG tablet [id number] No Take 1 tablet (50 mg total) by mouth 3 (three) times daily as needed. Sable Feil, [name] Taking Active   linaclotide Rolan Lipa) 290 MCG CAPS capsule [id number]  Take 1 capsule (290 mcg total) by mouth daily before breakfast. [name], MD  Active   lisinopril (ZESTRIL) 40 MG  tablet [id number]  Take 1 tablet (40 mg total) by mouth every evening. Jerrol Banana., MD  Active   meloxicam [facility]) 15 MG tablet [id number]  Take 15 mg by mouth daily. [provider]  Active Self  metaxalone (SKELAXIN) 800 MG tablet [id number] No Take 800 mg by mouth 3 (three) times daily. [provider] Not Taking Active   methocarbamol (ROBAXIN) 750 MG tablet [id number] No Take 1 tablet (750 mg total) by mouth every 6 (six) hours as needed for muscle spasms. Sable Feil, [name] Not Taking Active   methylPREDNISolone (MEDROL DOSEPAK) 4 MG TBPK tablet [id number] No Take Tapered dose as directed Sable Feil, [name] Not Taking Active   metoprolol tartrate (LOPRESSOR) 25 MG tablet [id number]  Take 1 tablet (25 mg total) by mouth 2 (two) times daily. Jerrol Banana., MD  Active   naloxone [facility]) 4 MG/0.1ML LIQD nasal spray kit [id number] No Place 1 spray into the nose as needed (opioid overdose). [provider] Taking Active Self           Med Note [name]   Wed May 10, 2020 12:59 PM) Has both the nasal spray and injection  Naloxone HCl (NARCAN IJ) [id number] No Inject 1 Dose as directed as needed (opioid overdose). [provider] Taking Active Self           Med Note [name]   Wed May 10, 2020 12:59 PM) Has both the nasal spray and injection  nitroGLYCERIN (NITROSTAT) 0.4 MG SL tablet [id number]  Place 1 tablet (0.4 mg total) under the tongue every 5 (five) minutes x 3 doses as needed for chest pain. [name], [name], [name]  Active   omeprazole (PRILOSEC) 40 MG capsule [id number] No Take 1 capsule (40 mg total) by mouth 2 (two) times daily. [name], MD Taking Expired 02/11/21 2359   omeprazole (PRILOSEC) 40 MG capsule [id number]  TAKE 1 CAPSULE BY MOUTH TWICE A DAY [name], MD  Active   ondansetron (ZOFRAN) 4 MG tablet [id number]  Take 1 tablet (4 mg total) by mouth every 8 (eight) hours as needed for nausea or vomiting. [name], MD  Active   ondansetron (ZOFRAN-ODT) 8 MG disintegrating tablet [id number]  Take 1 tablet (8 mg total) by mouth 2 (two) times daily. [name], MD  Active   oxyCODONE (ROXICODONE) 5 MG immediate release tablet [id number] No Take 4 tablets (20 mg total) by mouth every 6 (six) hours as needed (pain). Judith Part, MD Not Taking Active   SPIRIVA RESPIMAT 2.5 MCG/ACT AERS [id number]  INHALE 1 PUFF BY MOUTH INTO LUNGS ONCE DAILY Bacigalupo, Dionne Bucy, MD  Active   sucralfate (CARAFATE) 1 GM/10ML suspension [id number]  TAKE 10 MLS (1 G TOTAL) BY MOUTH 4 (FOUR) TIMES DAILY. [name], MD  Active             Patient Active Problem List   Diagnosis Date Noted   Lumbar radiculopathy 05/23/2020   Sepsis (  [place]) 10/19/2018   Severe sepsis ([place]) 10/19/2018   Cervical radiculopathy 02/12/2018   Chronic migraine 02/12/2018   Paresthesia 08/29/2017   Neck pain 08/29/2017   Daily headache 08/29/2017   Chronic active hepatitis ([employer]) 07/29/2017   Neurogenic pain 07/31/2016   Chronic pain syndrome 06/05/2016   Carotid artery stenosis 02/27/2016   Chronic constipation 02/27/2016   Chronic nausea 02/27/2016   Hypercholesterolemia 02/27/2016   Osteoarthritis 02/27/2016   PTSD (post-traumatic stress disorder) 02/27/2016   TIA (transient ischemic attack) 02/27/2016   Long term current use of opiate analgesic 02/27/2016   Long term prescription opiate use 02/27/2016   Encounter for pain management consult 02/27/2016   Chronic hip pain (Location of Tertiary source of pain) (Bilateral) (L>R) 02/27/2016   Chronic knee pain (Bilateral) (L>R) 02/27/2016   Chronic shoulder pain (Bilateral) (L>R) 02/27/2016   Chronic sacroiliac joint pain (Bilateral) (L>R) 02/27/2016   Chronic low back pain (Location of Primary Source of Pain) (Bilateral) (L>R) 02/27/2016   Chronic lower extremity pain (Location of Secondary source of pain) (Bilateral) (L>R) 02/27/2016   Osteoarthritis of hip (Bilateral) (L>R)  02/27/2016   Chronic neck pain (posterior midline) (Bilateral) (L>R) 02/27/2016   Cervicogenic headache (Bilateral) (L>R) 02/27/2016   Occipital headache (Bilateral) (L>R) 02/27/2016   Chronic upper extremity pain (Bilateral) (L>R) 02/27/2016   Chronic Cervical radicular pain (Bilateral) (L>R) 02/27/2016   Chronic lumbar radicular pain (Right) (L5 dermatome) 02/27/2016   Lumbar facet syndrome (Bilateral) (L>R) 02/27/2016   Long term prescription benzodiazepine use 02/27/2016   Hypertension    Chronic obstructive pulmonary disease (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   GAD (generalized anxiety disorder) 09/03/2014   Narcotic dependence ([place]) 09/03/2014   PVD (peripheral vascular disease) ([place]) 09/03/2014   Smoker 09/03/2014   Cigarette nicotine dependence with nicotine-induced disorder 07/08/2014   Anemia of chronic disease 03/19/2014   Narcotic abuse, continuous ([place]) 03/19/2014   Polysubstance abuse ([place]) 03/19/2014   MSSA (methicillin susceptible Staphylococcus aureus) septicemia ([place]) 03/07/2014   Hypomagnesemia 01/04/2014   Chronic cough 09/04/2011   Sleep apnea, obstructive 09/04/2011    Conditions to be addressed/monitored per PCP order:  Anxiety and Depression  Care Plan : LCSW Plan of Care  Updates made by Greg Cutter, LCSW since 04/20/2021 12:00 AM     Problem: Anxiety Identification (Anxiety)      Long-Range Goal: Anxiety Symptoms Identified   Start Date: 01/04/2021  Priority: High  Note:   Timeframe:  Long-Range Goal Priority:  High Start Date:    01/04/21                         Expected End Date:   ongoing                    Follow Up Date 05/09/21  Current barriers:   Chronic Mental Health needs related to anxiety, stress and depression Limited social support, Mental Health  Concerns , and Social Isolation Needs Support, Education, and Care Coordination in order to meet unmet mental health needs. Clinical Goal(s): patient will work with SW to address concerns related to increasing coping skills to help managed mood patient will work with [place] to address needs related to finding a mental health psychiatrist    Clinical Interventions:  Assessed patient's previous and current treatment, coping  skills, support system and barriers to care  Patient reports that she is in need of only medication management. She moved from [facility] to [facility]. Patient reports that she already has a Social worker in [place] at [employer] that she has built Museum/gallery exhibitions officer with. Patient is agreeable to [place] referral in order to find a psychiatrist for medication management. Referral placed on 01/04/21. Patient has her first initial appointment with a psychiatric nurse practitioner on 02/02/21 at 1:00 pm. This appointment is virtual and she has already received the link to join tele health session for 02/02/21. Update- 02/19/21- Patient reports that she successfully attended her virtual psychiatry visit with her matched psychiatric PA and was placed on a new medication. She reports liking her matched provider and has a follow up appointment with him next month. She was provided [facility]'s customer service number if she encounters any problems with this upcoming virtual visit. Update 04/20/21 has had made great progress with mental health treatment through [facility]. Their update by provider assigned to patient sstated Regression-None Change Minimal Progress-Some Progress (Progress since beginning of treatment). Patient by Berniece Pap on 04/13/21. Patient reports that she has built great rapport with provider. LCSW provided education on relaxation techniques such as meditation, deep breathing, massage, grounding exercsies or yoga that can activate the body's relaxation  response and ease symptoms of stress and anxiety. LCSW ask that when pt is struggling with difficult emotions and racing thoughts that they start this relaxation response process. LCSW provided extensive education on healthy coping skills for anxiety. SW used active and reflective listening, validated patient's feelings/concerns, and provided emotional support.    24- Hour Availability:    [facility]  [address] [place], [place] Crisis [phone]   [facility] [employer] [place]  [phone]    [place]  [phone] (after hours)   Therapeutic Alternative/Mobile Crisis   [phone]   [place] National Suicide Hotline  860-775-5674 [place]amantina Monks) [place] 988   Call 911 or go to emergency room   [facility]  [phone]);  [place] and [employer]  [phone]); [place], [place], [place], [place], [place], [place], [place]        Review various resources, discussed options and provided patient information about Discussed several options for long term counseling based on need and insurance Depression screen reviewed , Solution-Focused Strategies, Mindfulness or Relaxation Training, Active listening / Reflection utilized , Emotional Supportive Provided, Brief CBT , and Suicidal Ideation/Homicidal Ideation assessed: ; Patient interviewed and appropriate assessments performed Discussed plans with patient for ongoing care management follow up and provided patient with direct contact information for care management team Assisted patient/caregiver with obtaining information about health plan benefits Provided education and assistance to client regarding Advanced Directives. Discussed several options for long term counseling based on need and insurance.  Inter-disciplinary care team collaboration (see longitudinal plan of care) Patient  reports that she is still having issues with insomnia and is not sleeping well at night.  LCSW provided education on healthy sleep hygiene and what that looks like. LCSW encouraged patient to implement a night time routine into her schedule that works best for her and that she is able to maintain. Advised patient to implement deep breathing/grounding/meditation/self-care exercises into her nightly routine to combat racing thoughts at night. LCSW encouraged patient to wake up at the same time each day, make her sleeping environment comfortable, exercise  when able, to limit naps and to not eat or drink anything right before bed.   Patient Goals/Self-Care Activities: Over the next 120 days - barriers to treatment adherence identified - complementary therapy use encouraged - counseling provided - depression screen reviewed - self-awareness of emotional triggers encouraged - strategies to manage emotional triggers promoted - avoid negative self-talk - develop a personal safety plan - develop a plan to deal with triggers like holidays, anniversaries - exercise at least 2 to 3 times per week - have a plan for how to handle bad days - journal feelings and what helps to feel better or worse - spend time or talk with others at least 2 to 3 times per week - spend time or talk with others every day - watch for early signs of feeling worse - write in journal every day - begin personal counseling - call and visit an old friend - check out volunteer opportunities - join a support group - laugh; watch a funny movie or comedian - learn and use visualization or guided imagery - perform a random act of kindness - practice relaxation or meditation daily - start or continue a personal journal - talk about feelings with a friend, family or spiritual advisor - practice positive thinking and self-talk I have placed a referral with [facility] to assist with connecting you with a mental health provider. they will  contact you once a provider is located. -Update appointments attended.       Follow up:  Patient agrees to Care Plan and Follow-up.  Plan: The Managed Medicaid care management team will reach out to the patient again over the next 30 days.  Date of next scheduled Social Work care management/care coordination outreach:  05/09/21  [name], BSW, MSW, LCSW Managed Medicaid LCSW [email or web address] Phone: [phone]

## 2021-04-20 NOTE — Patient Instructions (Signed)
Visit Information  Ms. Houseworth was given information about Medicaid Managed Care team care coordination services as a part of their [place] Complete Medicaid benefit. [name] verbally consented to engagement with the [facility] Managed Care team.   If you are experiencing a medical emergency, please call 911 or report to your local emergency department or urgent care.   If you have a non-emergency medical problem during routine business hours, please contact your provider's office and ask to speak with a nurse.   For questions related to your [place] Complete Medicaid health plan, please call: [phone]  If you would like to schedule transportation through your [place] Complete Medicaid plan, please call the following number at least 2 days in advance of your appointment: [phone].   Call the [place] at [phone], at any time, 24 hours a day, 7 days a week. If you are in danger or need immediate medical attention call 911.  If you would like help to quit smoking, call 1-800-QUIT-NOW 318-333-2923) OR Espaol: 1-855-Djelo-Ya (9-937-169-6789) o para ms informacin haga clic aqu or Text READY to 200-400 to register via text  [name] - following are the goals we discussed in your visit today:   Goals Addressed             This Visit's Progress    Manage My Emotions       Timeframe:  Long-Range Goal Priority:  High Start Date:    01/04/21                         Expected End Date:   ongoing                    Follow Up Date - 05/09/21   - begin personal counseling - call and visit an old friend - check out volunteer opportunities - join a support group - laugh; watch a funny movie or comedian - learn and use visualization or guided imagery - perform a random act of kindness - practice relaxation or meditation daily - start or continue a personal journal - talk about feelings with a friend, family or spiritual advisor - practice  positive thinking and self-talk    Why is this important?   When you are stressed, down or upset, your body reacts too.  For example, your blood pressure may get higher; you may have a headache or stomachache.  When your emotions get the best of you, your body's ability to fight off cold and flu gets weak.  These steps will help you manage your emotions.     Notes:         [name], BSW, MSW, [employer] Managed Medicaid [email or web address] [email or web address] Phone: [phone]

## 2021-04-23 ENCOUNTER — Other Ambulatory Visit: Payer: Self-pay | Admitting: Neurological Surgery

## 2021-04-23 DIAGNOSIS — S129XXA Fracture of neck, unspecified, initial encounter: Secondary | ICD-10-CM

## 2021-04-24 ENCOUNTER — Other Ambulatory Visit: Payer: Self-pay

## 2021-04-24 MED ORDER — TRULANCE 3 MG PO TABS
1.0000 | ORAL_TABLET | Freq: Every day | ORAL | 3 refills | Status: DC
Start: 2021-04-24 — End: 2021-10-09

## 2021-04-26 ENCOUNTER — Other Ambulatory Visit: Payer: Self-pay | Admitting: Gastroenterology

## 2021-04-26 ENCOUNTER — Ambulatory Visit
Admission: RE | Admit: 2021-04-26 | Discharge: 2021-04-26 | Disposition: A | Discharge status: Home / Self Care | Payer: Medicaid Other | Source: Ambulatory Visit | Attending: Neurological Surgery | Admitting: Neurological Surgery

## 2021-04-26 DIAGNOSIS — M79661 Pain in right lower leg: Secondary | ICD-10-CM | POA: Diagnosis not present

## 2021-04-26 DIAGNOSIS — M79621 Pain in right upper arm: Secondary | ICD-10-CM | POA: Diagnosis not present

## 2021-04-26 DIAGNOSIS — M545 Low back pain, unspecified: Secondary | ICD-10-CM | POA: Diagnosis not present

## 2021-04-26 DIAGNOSIS — M542 Cervicalgia: Secondary | ICD-10-CM | POA: Diagnosis not present

## 2021-04-26 DIAGNOSIS — S129XXA Fracture of neck, unspecified, initial encounter: Secondary | ICD-10-CM | POA: Insufficient documentation

## 2021-04-26 DIAGNOSIS — M25511 Pain in right shoulder: Secondary | ICD-10-CM | POA: Diagnosis not present

## 2021-04-26 DIAGNOSIS — M79622 Pain in left upper arm: Secondary | ICD-10-CM | POA: Diagnosis not present

## 2021-04-26 DIAGNOSIS — M79662 Pain in left lower leg: Secondary | ICD-10-CM | POA: Diagnosis not present

## 2021-04-26 DIAGNOSIS — G890 Central pain syndrome: Secondary | ICD-10-CM | POA: Diagnosis not present

## 2021-04-26 DIAGNOSIS — M5136 Other intervertebral disc degeneration, lumbar region: Secondary | ICD-10-CM | POA: Diagnosis not present

## 2021-04-26 DIAGNOSIS — Z79891 Long term (current) use of opiate analgesic: Secondary | ICD-10-CM | POA: Diagnosis not present

## 2021-04-26 DIAGNOSIS — G894 Chronic pain syndrome: Secondary | ICD-10-CM | POA: Diagnosis not present

## 2021-04-26 DIAGNOSIS — M25512 Pain in left shoulder: Secondary | ICD-10-CM | POA: Diagnosis not present

## 2021-04-27 DIAGNOSIS — F411 Generalized anxiety disorder: Secondary | ICD-10-CM | POA: Diagnosis not present

## 2021-04-27 DIAGNOSIS — G4700 Insomnia, unspecified: Secondary | ICD-10-CM | POA: Diagnosis not present

## 2021-05-01 ENCOUNTER — Other Ambulatory Visit: Payer: Self-pay | Admitting: Neurological Surgery

## 2021-05-05 NOTE — Pre-Procedure Instructions (Signed)
Surgical Instructions    Your procedure is scheduled on 05/09/2021.  Report to [facility] Main Entrance "A" at 10:30 A.M., then check in with the Admitting office.  Call this number if you have problems the morning of surgery:  [phone]   If you have any questions prior to your surgery date call [phone]: Open Monday-Friday 8am-4pm    Remember:  Do not eat after midnight the night before your surgery  You may drink clear liquids until 09:30 the morning of your surgery.   Clear liquids allowed are: Water, Non-Citrus Juices (without pulp), Carbonated Beverages, Clear Tea, Black Coffee ONLY (NO MILK, CREAM OR POWDERED CREAMER of any kind), and Gatorade    Take these medicines the morning of surgery with A SIP OF WATER  amLODipine (NORVASC) atorvastatin (LIPITOR) DULoxetine (CYMBALTA) gabapentin (NEURONTIN) hydrOXYzine (ATARAX/VISTARIL) linaclotide (LINZESS metoprolol tartrate (LOPRESSOR omeprazole (PRILOSEC) Plecanatide (TRULANCE)  albuterol (PROAIR HFA) 108 (90 Base) MCG/ACT inhaler budesonide-formoterol (SYMBICORT) SPIRIVA RESPIMAT  Please bring all inhalers with you the day of surgery.   As of today, STOP taking any Aspirin (unless otherwise instructed by your surgeon) Aleve, Naproxen, Ibuprofen, Motrin, Advil, Goody's, BC's, all herbal medications, fish oil, and all vitamins.  STOP taking buprenorphine-naloxone (SUBOXONE) for 3 days prior to surgery.   After your COVID test   You are not required to quarantine however you are required to wear a well-fitting mask when you are out and around people not in your household.  If your mask becomes wet or soiled, replace with a new one.  Wash your hands often with soap and water for 20 seconds or clean your hands with an alcohol-based hand sanitizer that contains at least 60% alcohol.  Do not share personal items.  Notify your provider: if you are in close contact with someone who has COVID  or if you develop a fever  of 100.4 or greater, sneezing, cough, sore throat, shortness of breath or body aches.         Do not wear jewelry or makeup Do not wear lotions, powders, perfumes/colognes, or deodorant. Do not shave 48 hours prior to surgery.  Men may shave face and neck. Do not bring valuables to the hospital. DO Not wear nail polish, gel polish, artificial nails, or any other type of covering on natural nails including finger and toenails. If patients have artificial nails, gel coating, etc. that need to be removed by a nail salon, please have this removed prior to surgery or surgery may need to be canceled/delayed if the surgeon/ anesthesia feels like the patient is unable to be adequately monitored.             [facility] is not responsible for any belongings or valuables.  Do NOT Smoke (Tobacco/Vaping)  24 hours prior to your procedure  If you use a CPAP at night, you may bring your mask for your overnight stay.   Contacts, glasses, hearing aids, dentures or partials may not be worn into surgery, please bring cases for these belongings   For patients admitted to the hospital, discharge time will be determined by your treatment team.   Patients discharged the day of surgery will not be allowed to drive home, and someone needs to stay with them for 24 hours.  NO VISITORS WILL BE ALLOWED IN PRE-OP WHERE PATIENTS ARE PREPPED FOR SURGERY.  ONLY 1 SUPPORT PERSON MAY BE PRESENT IN THE WAITING ROOM WHILE YOU ARE IN SURGERY.  IF YOU ARE TO BE ADMITTED, ONCE YOU ARE  IN YOUR ROOM YOU WILL BE ALLOWED TWO (2) VISITORS. 1 (ONE) VISITOR MAY STAY OVERNIGHT BUT MUST ARRIVE TO THE ROOM BY 8pm.  Minor children may have two parents present. Special consideration for safety and communication needs will be reviewed on a case by case basis.  Special instructions:    Oral Hygiene is also important to reduce your risk of infection.  Remember - BRUSH YOUR TEETH THE MORNING OF SURGERY WITH YOUR REGULAR TOOTHPASTE   [facility]- Preparing For Surgery  Before surgery, you can play an important role. Because skin is not sterile, your skin needs to be as free of germs as possible. You can reduce the number of germs on your skin by washing with CHG (chlorahexidine gluconate) Soap before surgery.  CHG is an antiseptic cleaner which kills germs and bonds with the skin to continue killing germs even after washing.     Please do not use if you have an allergy to CHG or antibacterial soaps. If your skin becomes reddened/irritated stop using the CHG.  Do not shave (including legs and underarms) for at least 48 hours prior to first CHG shower. It is OK to shave your face.  Please follow these instructions carefully.     Shower the NIGHT BEFORE SURGERY and the MORNING OF SURGERY with CHG Soap.   If you chose to wash your hair, wash your hair first as usual with your normal shampoo. After you shampoo, rinse your hair and body thoroughly to remove the shampoo.  Then [employer] and genitals (private parts) with your normal soap and rinse thoroughly to remove soap.  After that Use CHG Soap as you would any other liquid soap. You can apply CHG directly to the skin and wash gently with a scrungie or a clean washcloth.   Apply the CHG Soap to your body ONLY FROM THE NECK DOWN.  Do not use on open wounds or open sores. Avoid contact with your eyes, ears, mouth and genitals (private parts). Wash Face and genitals (private parts)  with your normal soap.   Wash thoroughly, paying special attention to the area where your surgery will be performed.  Thoroughly rinse your body with warm water from the neck down.  DO NOT shower/wash with your normal soap after using and rinsing off the CHG Soap.  Pat yourself dry with a CLEAN TOWEL.  Wear CLEAN PAJAMAS to bed the night before surgery  Place CLEAN SHEETS on your bed the night before your surgery  DO NOT SLEEP WITH PETS.   Day of Surgery:  Take a shower with CHG soap. Wear  Clean/Comfortable clothing the morning of surgery Do not apply any deodorants/lotions.   Remember to brush your teeth WITH YOUR REGULAR TOOTHPASTE.   Please read over the following fact sheets that you were given.

## 2021-05-07 ENCOUNTER — Other Ambulatory Visit: Payer: Self-pay

## 2021-05-07 ENCOUNTER — Encounter (HOSPITAL_COMMUNITY)
Admission: RE | Admit: 2021-05-07 | Discharge: 2021-05-07 | Disposition: A | Discharge status: Home / Self Care | Payer: Medicaid Other | Source: Ambulatory Visit | Attending: Neurological Surgery | Admitting: Neurological Surgery

## 2021-05-07 ENCOUNTER — Encounter (HOSPITAL_COMMUNITY): Payer: Self-pay

## 2021-05-07 VITALS — BP 147/79 | HR 58 | Temp 98.4°F | Resp 17 | Ht 64.0 in | Wt 199.3 lb

## 2021-05-07 DIAGNOSIS — I2510 Atherosclerotic heart disease of native coronary artery without angina pectoris: Secondary | ICD-10-CM | POA: Insufficient documentation

## 2021-05-07 DIAGNOSIS — J449 Chronic obstructive pulmonary disease, unspecified: Secondary | ICD-10-CM | POA: Insufficient documentation

## 2021-05-07 DIAGNOSIS — M960 Pseudarthrosis after fusion or arthrodesis: Secondary | ICD-10-CM | POA: Diagnosis not present

## 2021-05-07 DIAGNOSIS — E7800 Pure hypercholesterolemia, unspecified: Secondary | ICD-10-CM | POA: Insufficient documentation

## 2021-05-07 DIAGNOSIS — Z01818 Encounter for other preprocedural examination: Secondary | ICD-10-CM | POA: Diagnosis not present

## 2021-05-07 DIAGNOSIS — F419 Anxiety disorder, unspecified: Secondary | ICD-10-CM | POA: Diagnosis not present

## 2021-05-07 DIAGNOSIS — Z20822 Contact with and (suspected) exposure to covid-19: Secondary | ICD-10-CM | POA: Insufficient documentation

## 2021-05-07 DIAGNOSIS — K219 Gastro-esophageal reflux disease without esophagitis: Secondary | ICD-10-CM | POA: Insufficient documentation

## 2021-05-07 DIAGNOSIS — Z6834 Body mass index (BMI) 34.0-34.9, adult: Secondary | ICD-10-CM | POA: Insufficient documentation

## 2021-05-07 DIAGNOSIS — I10 Essential (primary) hypertension: Secondary | ICD-10-CM | POA: Insufficient documentation

## 2021-05-07 DIAGNOSIS — I252 Old myocardial infarction: Secondary | ICD-10-CM | POA: Diagnosis not present

## 2021-05-07 DIAGNOSIS — Y838 Other surgical procedures as the cause of abnormal reaction of the patient, or of later complication, without mention of misadventure at the time of the procedure: Secondary | ICD-10-CM | POA: Diagnosis not present

## 2021-05-07 DIAGNOSIS — Z87891 Personal history of nicotine dependence: Secondary | ICD-10-CM | POA: Insufficient documentation

## 2021-05-07 DIAGNOSIS — E669 Obesity, unspecified: Secondary | ICD-10-CM | POA: Insufficient documentation

## 2021-05-07 HISTORY — DX: Pneumonia, unspecified organism: J18.9

## 2021-05-07 HISTORY — DX: Anemia, unspecified: D64.9

## 2021-05-07 LAB — TYPE AND SCREEN
ABO/RH(D): O POS
Antibody Screen: NEGATIVE

## 2021-05-07 LAB — COMPREHENSIVE METABOLIC PANEL
ALT: 18 U/L (ref 0–44)
AST: 28 U/L (ref 15–41)
Albumin: 3.6 g/dL (ref 3.5–5.0)
Alkaline Phosphatase: 79 U/L (ref 38–126)
Anion gap: 7 (ref 5–15)
BUN: 8 mg/dL (ref 6–20)
CO2: 28 mmol/L (ref 22–32)
Calcium: 9 mg/dL (ref 8.9–10.3)
Chloride: 97 mmol/L — ABNORMAL LOW (ref 98–111)
Creatinine, Ser: 0.89 mg/dL (ref 0.44–1.00)
GFR, Estimated: >60 mL/min (ref >60)
Glucose, Bld: 83 mg/dL (ref 70–99)
Potassium: 4.6 mmol/L (ref 3.5–5.1)
Sodium: 132 mmol/L — ABNORMAL LOW (ref 135–145)
Total Bilirubin: 0.4 mg/dL (ref 0.3–1.2)
Total Protein: 6.4 g/dL — ABNORMAL LOW (ref 6.5–8.1)

## 2021-05-07 LAB — SURGICAL PCR SCREEN
MRSA, PCR: NEGATIVE
Staphylococcus aureus: NEGATIVE

## 2021-05-07 LAB — CBC
HCT: 39 % (ref 36.0–46.0)
Hemoglobin: 12.3 g/dL (ref 12.0–15.0)
MCH: 26 pg (ref 26.0–34.0)
MCHC: 31.5 g/dL (ref 30.0–36.0)
MCV: 82.5 fL (ref 80.0–100.0)
Platelets: 218 10*3/uL (ref 150–400)
RBC: 4.73 MIL/uL (ref 3.87–5.11)
RDW: 15.9 % — ABNORMAL HIGH (ref 11.5–15.5)
WBC: 6.5 10*3/uL (ref 4.0–10.5)
nRBC: 0 % (ref 0.0–0.2)

## 2021-05-07 LAB — SARS CORONAVIRUS 2 (TAT 6-24 HRS): SARS Coronavirus 2: NEGATIVE

## 2021-05-07 NOTE — Progress Notes (Signed)
PCP - Carles Collet, [name] Cardiologist - Lujean Amel, MD  PPM/ICD - denies Device Orders - n/a Rep Notified - n/a  Chest x-ray - n/a EKG - 05/07/2021 Stress Test - 08/2019 ECHO - 09/01/2019 Cardiac Cath - 02/22/2016  Sleep Study - yes - negative for OSA CPAP - n/a  Fasting Blood Sugar - n/a  Blood Thinner Instructions: n/a  Aspirin Instructions: Patient was instructed: As of today, STOP taking any Aspirin (unless otherwise instructed by your surgeon) Aleve, Naproxen, Ibuprofen, Motrin, Advil, Goody's, BC's, all herbal medications, fish oil, and all vitamins.  Patient verbalized that she stopped taking Suboxone. She started to take Oxycodone.   ERAS Protcol - yes PRE-SURGERY Ensure - yes  COVID TEST- done in PAT on 05/07/2021   Anesthesia review: yes - cardiac history  Patient denies shortness of breath, fever, cough and chest pain at PAT appointment   All instructions explained to the patient, with a verbal understanding of the material. Patient agrees to go over the instructions while at home for a better understanding. Patient also instructed to self quarantine after being tested for COVID-19. The opportunity to ask questions was provided.

## 2021-05-08 DIAGNOSIS — M545 Low back pain, unspecified: Secondary | ICD-10-CM | POA: Diagnosis not present

## 2021-05-08 DIAGNOSIS — M25511 Pain in right shoulder: Secondary | ICD-10-CM | POA: Diagnosis not present

## 2021-05-08 DIAGNOSIS — G894 Chronic pain syndrome: Secondary | ICD-10-CM | POA: Diagnosis not present

## 2021-05-08 DIAGNOSIS — M79661 Pain in right lower leg: Secondary | ICD-10-CM | POA: Diagnosis not present

## 2021-05-08 DIAGNOSIS — G890 Central pain syndrome: Secondary | ICD-10-CM | POA: Diagnosis not present

## 2021-05-08 DIAGNOSIS — M25512 Pain in left shoulder: Secondary | ICD-10-CM | POA: Diagnosis not present

## 2021-05-08 DIAGNOSIS — M79662 Pain in left lower leg: Secondary | ICD-10-CM | POA: Diagnosis not present

## 2021-05-08 DIAGNOSIS — M542 Cervicalgia: Secondary | ICD-10-CM | POA: Diagnosis not present

## 2021-05-08 DIAGNOSIS — M79622 Pain in left upper arm: Secondary | ICD-10-CM | POA: Diagnosis not present

## 2021-05-08 DIAGNOSIS — M79621 Pain in right upper arm: Secondary | ICD-10-CM | POA: Diagnosis not present

## 2021-05-08 DIAGNOSIS — M5136 Other intervertebral disc degeneration, lumbar region: Secondary | ICD-10-CM | POA: Diagnosis not present

## 2021-05-08 DIAGNOSIS — Z79891 Long term (current) use of opiate analgesic: Secondary | ICD-10-CM | POA: Diagnosis not present

## 2021-05-08 NOTE — Progress Notes (Signed)
Anesthesia Chart Review:  Case: [id number] Date/Time: 05/09/21 1215   Procedure: C5, C6 Laminectomy,foraminotomy w/C5-6 C6-7 C7-T1 Posterior instrumented fusion - 3C/RM19   Anesthesia type: General   Pre-op diagnosis: Pseudoarthrosis of cervical spine   Location: [facility] OR ROOM 19 / [facility]   Surgeons: Judith Part, MD       DISCUSSION: Patient is a 55 year old female scheduled for the above procedure.   History includes smoking, CAD (reported MI ~ 2011; multiple evaluations for chest pain; only mild CAD 01/2016; normal stress test 08/2019), HTN, COPD, hypercholesterolemia, anxiety, GERD, asthma, hepatitis C (s/p treatment), CVA (reported CVA prior to 2019), pyelonephritis/severe sepsis (09/2018), C5-6 ACDF (01/11/19), chronic back pain, chronic opiate use, spinal surgery (C 6-7 ACDF 03/30/18; C5-6 ACDF 01/11/19; right L5-S1 fusion 11/130/21).  BMI is consistent with obesity.   As above, patient with multiple chest pain evaluations. Cath in 2017 showed only mild CAD (25% LM & CX), Last cardiology visit with Dr. Clayborn Bigness with [facility] on 08/11/19 with normal stress test on 09/01/19. She denied chest pain and SOB at PAT RN visit. EKG stable (SR, cannot rule out anterior infarct, old).    Negative preoperative COVID-19 test on 05/07/21. Anesthesia team to evaluate on the day of surgery. Reportedly Suboxone on hold for surgery, and is taking oxycodone as needed started 05/08/21.    VS: BP (!) 147/79   Pulse (!) 58   Temp 36.9 C   Resp 17   Ht _0  (1.626 m)   Wt 90.4 kg   LMP 12/16/2017 (Approximate)   SpO2 97%   BMI 34.21 kg/m    PROVIDERS: [name], [name] (Inactive) is listed as PCP ([place]) - Lujean Amel, MD is cardiologist Jefm Bryant, see [place]). Last evaluation 08/11/19 for preoperative evaluation prior to hand surgery. She had a normal stress test and normal LVEF (see CV).  Renee Harder, MD is neurologist. Last visit 02/12/18.  [name], MD is GI   LABS: Labs reviewed: Acceptable for surgery. (all labs ordered are listed, but only abnormal results are displayed)  Labs Reviewed  COMPREHENSIVE METABOLIC PANEL - Abnormal; Notable for the following components:      Result Value   Sodium 132 (*)    Chloride 97 (*)    Total Protein 6.4 (*)    All other components within normal limits  CBC - Abnormal; Notable for the following components:   RDW 15.9 (*)    All other components within normal limits  SURGICAL PCR SCREEN  SARS CORONAVIRUS 2 (TAT 6-24 HRS)  TYPE AND SCREEN     IMAGES: MRI C-spine 04/26/21: IMPRESSION: 1. Status post ACDF C5-C7. Osseous hypertrophy at these levels causes moderate spinal canal stenosis and moderate bilateral neural foraminal narrowing at C6-C7 and mild spinal canal stenosis and mild bilateral neural foraminal narrowing at C5-C6, both of which have progressed from the prior MRI. 2. C4-C5 mild left neural foraminal narrowing, which is new from the exam.  CT C-spine 03/31/21: IMPRESSION: 1. ACDF at C5-6. Progressive loss of height the bone graft. No definite solid fusion. Findings compatible pseudoarthrosis. Progressive uncinate spurring causing progressive spinal and foraminal stenosis bilaterally at C5-6 2. ACDF at C6-7. There is probable solid arthrodesis anteriorly. There is continued lucency through the disc space posteriorly with associated diffuse uncinate spurring. Spinal stenosis and moderate foraminal stenosis bilaterally appears progressive.      EKG: 05/07/21: Sinus bradycardia at 57 bpm Low voltage QRS Cannot rule out  Anterior infarct , age undetermined Abnormal ECG No significant change since last tracing Confirmed by Daneen Schick [phone]) on 05/07/2021 9:14:05 PM   CV: Echo 09/01/19: IMPRESSIONS   1. Left ventricular ejection fraction, by estimation, is 55 to 60%. The  left ventricle has normal function. The left ventricle has no regional  wall  motion abnormalities. Left ventricular diastolic parameters were  normal.   2. Right ventricular systolic function is normal. The right ventricular  size is normal. There is normal pulmonary artery systolic pressure.   3. The mitral valve is normal in structure. Mild mitral valve  regurgitation. No evidence of mitral stenosis.   4. The aortic valve is normal in structure. Aortic valve regurgitation is  not visualized. No aortic stenosis is present.   5. The inferior vena cava is normal in size with greater than 50%  respiratory variability, suggesting right atrial pressure of 3 mmHg.      Nuclear stress test 09/01/19: Blood pressure demonstrated a normal response to exercise. There was no ST segment deviation noted during stress. The study is normal. This is a low risk study. The left ventricular ejection fraction is normal (55-65%).     Cardiac cath 02/22/16: LM lesion, 25 %stenosed. Prox Cx lesion, 25 %stenosed. The left ventricular systolic function is normal. LV end diastolic pressure is normal. Left dominant system Mild coronary disease in the proximal ostial circumflex as well as mid left main 25% nonobstructive Conclusion Left dominant system normal left ventricular function no evidence of significant obstructive coronary disease    Past Medical History:  Diagnosis Date   Anemia    Anxiety    Asthma    Atypical chest pain 08/02/2015   CAD (coronary artery disease)    Chronic back pain    COPD (chronic obstructive pulmonary disease) (HCC)    Coronary artery disease    a. Mild-non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/2014   GERD (gastroesophageal reflux disease)    Hepatitis C    per patient, "took medicines for it and no longer has it"   Hypercholesteremia    Hypertension    Hypokalemia    Hypomagnesemia 01/04/2014   Liver disease    MI (myocardial infarction) ([place])    Nerve pain    per patient, in the lower back   Opiate use 02/27/2016   Osteoarthritis    Pneumonia     Stroke [facility])     Past Surgical History:  Procedure Laterality Date   ANTERIOR CERVICAL DECOMP/DISCECTOMY FUSION N/A 03/30/2018   Procedure: Cervical six-seven Anterior cervical decompression/discectomy/fusion;  Surgeon: Judith Part, MD;  Location: [place];  Service: Neurosurgery;  Laterality: N/A;   ANTERIOR CERVICAL DECOMP/DISCECTOMY FUSION N/A 01/11/2019   Procedure: Cervical Five-Six Anterior cervical discectomy fusion,  Cervical Five to Cervical Seven anterior instrumented fusion;  Surgeon: Judith Part, MD;  Location: [place];  Service: Neurosurgery;  Laterality: N/A;  Cervical Five-Six Anterior cervical discectomy fusion,  Cervical Five to Cervical Seven anterior instrumented fusion   BACK SURGERY     CARDIAC CATHETERIZATION Left 02/22/2016   Procedure: Left Heart Cath and Coronary Angiography;  Surgeon: Yolonda Kida, MD;  Location: [facility] [facility];  Service: Cardiovascular;  Laterality: Left;   CATARACT EXTRACTION W/ INTRAOCULAR LENS IMPLANT Right    COLONOSCOPY WITH PROPOFOL N/A 09/19/2016   Procedure: COLONOSCOPY WITH PROPOFOL;  Surgeon: [name], MD;  Location: [facility] ENDOSCOPY;  Service: Endoscopy;  Laterality: N/A;   DIAGNOSTIC LAPAROSCOPY     ESOPHAGOGASTRODUODENOSCOPY (EGD) WITH  PROPOFOL N/A 09/18/2017   Procedure: ESOPHAGOGASTRODUODENOSCOPY (EGD) WITH PROPOFOL;  Surgeon: [name], MD;  Location: [facility] ENDOSCOPY;  Service: Gastroenterology;  Laterality: N/A;   ESOPHAGOGASTRODUODENOSCOPY (EGD) WITH PROPOFOL N/A 08/10/2019   Procedure: ESOPHAGOGASTRODUODENOSCOPY (EGD) WITH PROPOFOL;  Surgeon: [name], MD;  Location: [facility] ENDOSCOPY;  Service: Endoscopy;  Laterality: N/A;   EYE SURGERY     R eye - cataract   FRACTURE SURGERY     hemorroids     NASAL SINUS SURGERY     ORIF FEMUR FRACTURE     ORIF TIBIA & FIBULA FRACTURES     OVARY SURGERY     TRANSFORAMINAL LUMBAR INTERBODY FUSION W/ MIS 1 LEVEL Right 05/23/2020   Procedure: Right Lumbar Five  Sacral One Minimally invasive transforaminal lumbar interbody fusion;  Surgeon: Judith Part, MD;  Location: [place];  Service: Neurosurgery;  Laterality: Right;  Right Lumbar Five Sacral One Minimally invasive transforaminal lumbar interbody fusion    MEDICATIONS:  Plecanatide (TRULANCE) 3 MG TABS   albuterol (PROAIR HFA) 108 (90 Base) MCG/ACT inhaler   albuterol (PROVENTIL) (2.5 MG/3ML) 0.083% nebulizer solution   amLODipine (NORVASC) 5 MG tablet   atorvastatin (LIPITOR) 20 MG tablet   budesonide-formoterol (SYMBICORT) 160-4.5 MCG/ACT inhaler   buprenorphine-naloxone (SUBOXONE) 8-2 mg SUBL SL tablet   docusate sodium (COLACE) 100 MG capsule   DULoxetine (CYMBALTA) 30 MG capsule   DULoxetine (CYMBALTA) 60 MG capsule   gabapentin (NEURONTIN) 600 MG tablet   hydrOXYzine (ATARAX/VISTARIL) 50 MG tablet   linaclotide (LINZESS) 290 MCG CAPS capsule   lisinopril (ZESTRIL) 40 MG tablet   meloxicam (MOBIC) 15 MG tablet   methylPREDNISolone (MEDROL DOSEPAK) 4 MG TBPK tablet   metoprolol tartrate (LOPRESSOR) 25 MG tablet   naloxone (NARCAN) 4 MG/0.1ML LIQD nasal spray kit   Naloxone HCl (NARCAN IJ)   nitroGLYCERIN (NITROSTAT) 0.4 MG SL tablet   omeprazole (PRILOSEC) 40 MG capsule   omeprazole (PRILOSEC) 40 MG capsule   ondansetron (ZOFRAN) 4 MG tablet   ondansetron (ZOFRAN-ODT) 8 MG disintegrating tablet   oxyCODONE (ROXICODONE) 5 MG immediate release tablet   SPIRIVA RESPIMAT 2.5 MCG/ACT AERS   sucralfate (CARAFATE) 1 GM/10ML suspension   No current facility-administered medications for this encounter.    Myra Gianotti, [name] Surgical Short Stay/Anesthesiology [facility] Phone [phone] [facility] Phone [phone] 05/08/2021 11:52 AM

## 2021-05-08 NOTE — Anesthesia Preprocedure Evaluation (Addendum)
Anesthesia Evaluation  Patient identified by MRN, date of birth, ID band Patient awake    Reviewed: Allergy & Precautions, NPO status , Patient's Chart, lab work & pertinent test results  Airway Mallampati: II  TM Distance: >3 FB     Dental   Pulmonary asthma , sleep apnea , pneumonia, COPD, Current Smoker and Patient abstained from smoking.,    breath sounds clear to auscultation       Cardiovascular hypertension, + CAD, + Past MI and + Peripheral Vascular Disease   Rhythm:Regular Rate:Normal  Echo 09/01/19: IMPRESSIONS  1. Left ventricular ejection fraction, by estimation, is 55 to 60%. The  left ventricle has normal function. The left ventricle has no regional  wall motion abnormalities. Left ventricular diastolic parameters were  normal.  2. Right ventricular systolic function is normal. The right ventricular  size is normal. There is normal pulmonary artery systolic pressure.  3. The mitral valve is normal in structure. Mild mitral valve  regurgitation. No evidence of mitral stenosis.  4. The aortic valve is normal in structure. Aortic valve regurgitation is  not visualized. No aortic stenosis is present.  5. The inferior vena cava is normal in size with greater than 50%  respiratory variability, suggesting right atrial pressure of 3 mmHg.    Nuclear stress test 09/01/19: . Blood pressure demonstrated a normal response to exercise. . There was no ST segment deviation noted during stress. . The study is normal. . This is a low risk study. . The left ventricular ejection fraction is normal (55-65%).   Neuro/Psych  Headaches, PSYCHIATRIC DISORDERS TIA Neuromuscular disease CVA    GI/Hepatic GERD  ,(+) Hepatitis -  Endo/Other  negative endocrine ROS  Renal/GU negative Renal ROS     Musculoskeletal  (+) Arthritis ,   Abdominal   Peds  Hematology   Anesthesia Other Findings   Reproductive/Obstetrics                            Anesthesia Physical Anesthesia Plan  ASA: 3  Anesthesia Plan: General   Post-op Pain Management:    Induction: Intravenous  PONV Risk Score and Plan: 2 and Ondansetron, Dexamethasone and Midazolam  Airway Management Planned: Oral ETT  Additional Equipment:   Intra-op Plan:   Post-operative Plan: Possible Post-op intubation/ventilation  Informed Consent: I have reviewed the patients History and Physical, chart, labs and discussed the procedure including the risks, benefits and alternatives for the proposed anesthesia with the patient or authorized representative who has indicated his/her understanding and acceptance.     Dental advisory given  Plan Discussed with: CRNA and Anesthesiologist  Anesthesia Plan Comments: (PAT note written 05/08/2021 by Myra Gianotti, [name]. )       Anesthesia Quick Evaluation

## 2021-05-09 ENCOUNTER — Other Ambulatory Visit: Payer: Self-pay | Admitting: Licensed Clinical Social Worker

## 2021-05-09 NOTE — Patient Instructions (Signed)
Visit Information  Ms. Apperson was given information about Medicaid Managed Care team care coordination services as a part of their [place] Complete Medicaid benefit. [name] verbally consented to engagement with the [facility] Managed Care team.   If you are experiencing a medical emergency, please call 911 or report to your local emergency department or urgent care.   If you have a non-emergency medical problem during routine business hours, please contact your provider's office and ask to speak with a nurse.   For questions related to your [place] Medicaid health plan, please call: [phone]  If you would like to schedule transportation through your [place] Complete Medicaid plan, please call the following number at least 2 days in advance of your appointment: [phone].   Call the [place] at [phone], at any time, 24 hours a day, 7 days a week. If you are in danger or need immediate medical attention call 911.  If you would like help to quit smoking, call 1-800-QUIT-NOW 807-185-0709) OR Espaol: 1-855-Djelo-Ya (6-160-737-1062) o para ms informacin haga clic aqu or Text READY to 200-400 to register via text  Following is a copy of your plan of care:  Care Plan : LCSW Plan of Care  Updates made by Greg Cutter, LCSW since 05/09/2021 12:00 AM     Problem: Anxiety Identification (Anxiety)      Long-Range Goal: Anxiety Symptoms Identified   Start Date: 01/04/2021  This Visit's Progress: On track  Priority: High  Note:   Timeframe:  Long-Range Goal Priority:  High Start Date:    01/04/21                         Expected End Date:   ongoing                    Follow Up Date 05/28/21  Current barriers:   Chronic Mental Health needs related to anxiety, stress and depression Limited social support, Mental Health Concerns , and Social Isolation Needs Support, Education, and Care Coordination in order to meet unmet mental health  needs. Clinical Goal(s): patient will work with SW to address concerns related to increasing coping skills to help managed mood patient will work with [place] to address needs related to finding a mental health psychiatrist      Patient Goals/Self-Care Activities: Over the next 120 days - barriers to treatment adherence identified - complementary therapy use encouraged - counseling provided - depression screen reviewed - self-awareness of emotional triggers encouraged - strategies to manage emotional triggers promoted - avoid negative self-talk - develop a personal safety plan - develop a plan to deal with triggers like holidays, anniversaries - exercise at least 2 to 3 times per week - have a plan for how to handle bad days - journal feelings and what helps to feel better or worse - spend time or talk with others at least 2 to 3 times per week - spend time or talk with others every day - watch for early signs of feeling worse - write in journal every day - begin personal counseling - call and visit an old friend - check out volunteer opportunities - join a support group - laugh; watch a funny movie or comedian - learn and use visualization or guided imagery - perform a random act of kindness - practice relaxation or meditation daily - start or continue a personal journal - talk about feelings with a friend, family  or spiritual advisor - practice positive thinking and self-talk I have placed a referral with [facility] to assist with connecting you with a mental health provider. they will contact you once a provider is located. -Update appointments attended.       [name], BSW, MSW, [employer] Managed Medicaid LCSW [email or web address] Phone: [phone]

## 2021-05-09 NOTE — Patient Outreach (Signed)
Medicaid Managed Care Social Work Note  05/09/2021 Name:  Sara Munoz MRN:  [id number] DOB:  April 22, 1966  Sara Munoz is an 55 y.o. year old female who is a primary patient of [name], [name] (Inactive).  The Medicaid Managed Care Coordination team was consulted for assistance with:  [place] and Resources  Sara Munoz was given information about Medicaid Managed Care Coordination team services today. Sara Munoz Patient agreed to services and verbal consent obtained.  Engaged with patient  for by telephone forfollow up visit in response to referral for case management and/or care coordination services.   Assessments/Interventions:  Review of past medical history, allergies, medications, health status, including review of consultants reports, laboratory and other test data, was performed as part of comprehensive evaluation and provision of chronic care management services.  SDOH: (Social Determinant of Health) assessments and interventions performed: SDOH Interventions    Flowsheet Row Most Recent Value  SDOH Interventions   Stress Interventions Provide Counseling, Offered [employer] Resources       Advanced Directives Status:  See Care Plan for related entries.  Care Plan                 Allergies  Allergen Reactions   Flexeril [Cyclobenzaprine] Hives and Swelling    Facial/lip swelling      Levofloxacin Hives and Swelling   Phenergan [Promethazine Hcl] Other (See Comments)    Agitation.    Toradol [Ketorolac Tromethamine] Swelling and Other (See Comments)    Facial/tongue swelling    Tramadol Hives, Swelling and Other (See Comments)    Lip swelling    Zoloft [Sertraline Hcl] Swelling    Tongue swelling       Medications Reviewed Today     Reviewed by Sara Sias, RN (Registered Nurse) on 05/07/21 at 66  Med List Status: <None>   Medication Order Taking? Sig Documenting Provider Last Dose Status Informant  albuterol (PROAIR  HFA) 108 (90 Base) MCG/ACT inhaler [id number]  INHALE TWO PUFFS BY MOUTH INTO LUNGS EVERY 6 HOURS AS NEEDED FOR SHORTNESS OF BREATH OR wheezing Sara Munoz., MD  Active Self  albuterol (PROVENTIL) (2.5 MG/3ML) 0.083% nebulizer solution [id number]  Take 3 mLs (2.5 mg total) by nebulization every 6 (six) hours as needed for wheezing or shortness of breath. Sara Pita, MD  Active Self  amLODipine (NORVASC) 5 MG tablet [id number]  Take 1 tablet (5 mg total) by mouth daily. Sara Munoz., MD  Active Self  atorvastatin (LIPITOR) 20 MG tablet [id number]  Take 1 tablet (20 mg total) by mouth every evening. Sara Munoz., MD  Active Self  budesonide-formoterol [facility]) 160-4.5 MCG/ACT inhaler [id number]  Inhale 2 puffs into the lungs in the morning and at bedtime. Sara Munoz., MD  Active Self  buprenorphine-naloxone (SUBOXONE) 8-2 mg SUBL SL tablet [id number]  Place 1 tablet under the tongue in the morning and at bedtime. [provider]  Active Self           Med Note Sara Munoz May 03, 2021 11:27 AM) Per patient md is planning to switch patient on 05/08/21 to oxycodone  docusate sodium (COLACE) 100 MG capsule [id number]  Take 100 mg by mouth 2 (two) times daily. [provider]  Active Self  DULoxetine (CYMBALTA) 30 MG capsule [id number]  Take 30 mg by mouth 2 (two) times daily. [provider]  Active Self  DULoxetine (CYMBALTA) 60  MG capsule [id number]  Take 1 capsule (60 mg total) by mouth daily. Please call [phone] to schedule appt or may request future refills from PCP.  Patient not taking: No sig reported   Sara Pacas, MD  Active Self  gabapentin (NEURONTIN) 600 MG tablet [id number]  Take 600 mg by mouth 4 (four) times daily. [provider]  Active Self  hydrOXYzine (ATARAX/VISTARIL) 50 MG tablet [id number]  Take 1 tablet (50 mg total) by mouth 3 (three) times daily as needed.  Patient taking differently: Take 50  mg by mouth at bedtime.   Sara Feil, [name]  Active Self  linaclotide Rolan Lipa) 290 MCG CAPS capsule [id number]  Take 290 mcg by mouth daily before breakfast. [provider]  Active Self  lisinopril (ZESTRIL) 40 MG tablet [id number]  Take 1 tablet (40 mg total) by mouth every evening. Sara Munoz., MD  Active Self  meloxicam [facility]) 15 MG tablet [id number]  Take 15 mg by mouth daily. [provider]  Active Self  methylPREDNISolone (MEDROL DOSEPAK) 4 MG TBPK tablet [id number]  Take Tapered dose as directed  Patient not taking: No sig reported   Sara Feil, [name]  Active Self  metoprolol tartrate (LOPRESSOR) 25 MG tablet [id number]  Take 1 tablet (25 mg total) by mouth 2 (two) times daily. Sara Munoz., MD  Active Self  naloxone [facility]) 4 MG/0.1ML LIQD nasal spray kit [id number]  Place 1 spray into the nose as needed (opioid overdose). [provider]  Active Self           Med Note Sara Munoz May 03, 2021 11:23 AM)    Naloxone HCl [facility]) [id number]  Inject 1 Dose as directed as needed (opioid overdose). [provider]  Active Self           Med Note Sara Munoz May 03, 2021 11:32 AM)    nitroGLYCERIN (NITROSTAT) 0.4 MG SL tablet [id number]  Place 1 tablet (0.4 mg total) under the tongue every 5 (five) minutes x 3 doses as needed for chest pain. [name], [name], [name]  Active Self  omeprazole (PRILOSEC) 40 MG capsule [id number]  Take 1 capsule (40 mg total) by mouth 2 (two) times daily.  Patient not taking: Reported on 05/03/2021   [name], MD  Expired 02/11/21 2359   omeprazole (PRILOSEC) 40 MG capsule [id number]  TAKE 1 CAPSULE BY MOUTH TWICE A DAY [name], MD  Active Self  ondansetron (ZOFRAN) 4 MG tablet [id number]  Take 1 tablet (4 mg total) by mouth every 8 (eight) hours as needed for nausea or vomiting. [name], MD  Active Self  ondansetron (ZOFRAN-ODT) 8 MG disintegrating tablet [id number]   Take 1 tablet (8 mg total) by mouth 2 (two) times daily. [name], MD  Active Self  oxyCODONE (ROXICODONE) 5 MG immediate release tablet [id number]  Take 4 tablets (20 mg total) by mouth every 6 (six) hours as needed (pain).  Patient not taking: No sig reported   Judith Part, MD  Active Self  Plecanatide (TRULANCE) 3 MG TABS [id number]  Take 1 tablet by mouth daily. [name], MD  Active Self           Med Note Sara Munoz May 03, 2021 11:29 AM) Has not started yet, will finish linzess first   SPIRIVA RESPIMAT 2.5 MCG/ACT AERS [id number]  INHALE 1 PUFF BY MOUTH INTO LUNGS ONCE  DAILY Bacigalupo, Dionne Bucy, MD  Active Self  sucralfate (CARAFATE) 1 GM/10ML suspension [id number]  TAKE 10 MLS (1 G TOTAL) BY MOUTH 4 (FOUR) TIMES DAILY. [name], MD  Active Self            Patient Active Problem List   Diagnosis Date Noted   Lumbar radiculopathy 05/23/2020   Sepsis ([place]) 10/19/2018   Severe sepsis ([place]) 10/19/2018   Cervical radiculopathy 02/12/2018   Chronic migraine 02/12/2018   Paresthesia 08/29/2017   Neck pain 08/29/2017   Daily headache 08/29/2017   Chronic active hepatitis ([place]) 07/29/2017   Neurogenic pain 07/31/2016   Chronic pain syndrome 06/05/2016   Carotid artery stenosis 02/27/2016   Chronic constipation 02/27/2016   Chronic nausea 02/27/2016   Hypercholesterolemia 02/27/2016   Osteoarthritis 02/27/2016   PTSD (post-traumatic stress disorder) 02/27/2016   TIA (transient ischemic attack) 02/27/2016   Long term current use of opiate analgesic 02/27/2016   Long term prescription opiate use 02/27/2016   Encounter for pain management consult 02/27/2016   Chronic hip pain (Location of Tertiary source of pain) (Bilateral) (L>R) 02/27/2016   Chronic knee pain (Bilateral) (L>R) 02/27/2016   Chronic shoulder pain (Bilateral) (L>R) 02/27/2016   Chronic sacroiliac joint pain (Bilateral) (L>R) 02/27/2016   Chronic low back pain (Location of Primary Source  of Pain) (Bilateral) (L>R) 02/27/2016   Chronic lower extremity pain (Location of Secondary source of pain) (Bilateral) (L>R) 02/27/2016   Osteoarthritis of hip (Bilateral) (L>R) 02/27/2016   Chronic neck pain (posterior midline) (Bilateral) (L>R) 02/27/2016   Cervicogenic headache (Bilateral) (L>R) 02/27/2016   Occipital headache (Bilateral) (L>R) 02/27/2016   Chronic upper extremity pain (Bilateral) (L>R) 02/27/2016   Chronic Cervical radicular pain (Bilateral) (L>R) 02/27/2016   Chronic lumbar radicular pain (Right) (L5 dermatome) 02/27/2016   Lumbar facet syndrome (Bilateral) (L>R) 02/27/2016   Long term prescription benzodiazepine use 02/27/2016   Hypertension    Chronic obstructive pulmonary disease ([place]) 10/09/2015   GERD (gastroesophageal reflux disease) 10/09/2015   Non-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/2014 08/02/2015   Hyperlipidemia 08/02/2015   Costochondritis 08/02/2015   Drug abuse, 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[place]) 09/03/2014   GAD (generalized anxiety disorder) 09/03/2014   Narcotic dependence ([place]) 09/03/2014   PVD (peripheral vascular disease) ([place]) 09/03/2014   Smoker 09/03/2014   Cigarette nicotine dependence with nicotine-induced disorder 07/08/2014   Anemia of chronic disease 03/19/2014   Narcotic abuse, continuous ([place]) 03/19/2014   Polysubstance abuse ([place]) 03/19/2014   MSSA (methicillin susceptible Staphylococcus aureus) septicemia ([place]) 03/07/2014   Hypomagnesemia 01/04/2014   Chronic cough 09/04/2011   Sleep apnea, obstructive 09/04/2011    Conditions to be addressed/monitored per PCP order:  Anxiety and Depression  Care Plan : LCSW Plan of Care  Updates made by Greg Cutter, LCSW since 05/09/2021 12:00 AM     Problem: Anxiety Identification (Anxiety)      Long-Range Goal: Anxiety Symptoms Identified   Start Date: 01/04/2021  This Visit's Progress: On track  Priority: High  Note:   Timeframe:  Long-Range Goal Priority:  High Start Date:    01/04/21                          Expected End Date:   ongoing                    Follow Up Date 05/28/21  Current barriers:   Chronic Mental Health needs related to anxiety, stress and depression Limited social support, Mental  Health Concerns , and Social Isolation Needs Support, Education, and Care Coordination in order to meet unmet mental health needs. Clinical Goal(s): patient will work with SW to address concerns related to increasing coping skills to help managed mood patient will work with [place] to address needs related to finding a mental health psychiatrist    Clinical Interventions:  Assessed patient's previous and current treatment, coping skills, support system and barriers to care  Patient reports that she is in need of only medication management. She moved from [facility] to [facility]. Patient reports that she already has a Social worker in [place] at [employer] that she has built Museum/gallery exhibitions officer with. Patient is agreeable to [place] referral in order to find a psychiatrist for medication management. Referral placed on 01/04/21. Patient has her first initial appointment with a psychiatric nurse practitioner on 02/02/21 at 1:00 pm. This appointment is virtual and she has already received the link to join tele health session for 02/02/21. Update- 02/19/21- Patient reports that she successfully attended her virtual psychiatry visit with her matched psychiatric PA and was placed on a new medication. She reports liking her matched provider and has a follow up appointment with him next month. She was provided [facility] customer service number if she encounters any problems with this upcoming virtual visit. Update 04/20/21 has had made great progress with mental health treatment through [place]. Their update by provider assigned to patient sstated Regression-None Change Minimal Progress-Some Progress (Progress since beginning of treatment). Patient by Berniece Pap on 04/13/21. Patient reports  that she has built great rapport with provider. LCSW provided education on relaxation techniques such as meditation, deep breathing, massage, grounding exercsies or yoga that can activate the body's relaxation response and ease symptoms of stress and anxiety. LCSW ask that when pt is struggling with difficult emotions and racing thoughts that they start this relaxation response process. LCSW provided extensive education on healthy coping skills for anxiety. SW used active and reflective listening, validated patient's feelings/concerns, and provided emotional support.    24- Hour Availability:    [facility]  [address] [place], [place] [facility] [phone]   [facility] [employer] [place]  [phone]    [place]  [phone] (after hours)   [facility]rapeutic Alternative/Mobile Crisis   [phone]   [place] National Suicide Hotline  319-520-7442 [place]) [place] 988   Call 911 or go to emergency room   [facility]  [phone]);  [place] and [employer]  [phone]); [place], [place], [place], [place], [place], [place], [place]        Review various resources, discussed options and provided patient information about Discussed several options for long term counseling based on need and insurance Depression screen reviewed , Solution-Focused Strategies, Mindfulness or Relaxation Training, Active listening / Reflection utilized , Emotional Supportive Provided, Brief CBT , and Suicidal Ideation/Homicidal Ideation assessed: ; Patient interviewed and appropriate assessments performed Discussed plans with patient for ongoing care management follow up and provided patient with direct contact information for care management team Assisted patient/caregiver with obtaining information about health plan benefits Provided education and assistance to  client regarding Advanced Directives. Discussed several options for long term counseling based on need and insurance.  Inter-disciplinary care team collaboration (see longitudinal plan of care) Patient reports that she is still having issues with insomnia and is not sleeping well at night.  LCSW provided education  on healthy sleep hygiene and what that looks like. LCSW encouraged patient to implement a night time routine into her schedule that works best for her and that she is able to maintain. Advised patient to implement deep breathing/grounding/meditation/self-care exercises into her nightly routine to combat racing thoughts at night. LCSW encouraged patient to wake up at the same time each day, make her sleeping environment comfortable, exercise when able, to limit naps and to not eat or drink anything right before bed. 05/09/21 Update- Patient reports that she is doing well but experiencing chronic back pain. She has a spine surgery scheduled for 05/14/21. Patient reports that she no longer goes to counseling weekly at [facility] due to her improvement in her mental health. She reports going once per month now. Patient's next appointment with her psychiatrist that [facility] matched her with is on 05/25/21. Patient reports that she is taking her cymbalta and hydroxyzine as prescribed and she feels that these medications are effectively working for her. Patient shares that her main support is "My friend Jeneen Rinks and God." Patient has a neurologist and GI appointment on 05/21/21 and has stable transportation to these appointments through her insurance.   Patient Goals/Self-Care Activities: Over the next 120 days - barriers to treatment adherence identified - complementary therapy use encouraged - counseling provided - depression screen reviewed - self-awareness of emotional triggers encouraged - strategies to manage emotional triggers promoted - avoid negative self-talk - develop a  personal safety plan - develop a plan to deal with triggers like holidays, anniversaries - exercise at least 2 to 3 times per week - have a plan for how to handle bad days - journal feelings and what helps to feel better or worse - spend time or talk with others at least 2 to 3 times per week - spend time or talk with others every day - watch for early signs of feeling worse - write in journal every day - begin personal counseling - call and visit an old friend - check out volunteer opportunities - join a support group - laugh; watch a funny movie or comedian - learn and use visualization or guided imagery - perform a random act of kindness - practice relaxation or meditation daily - start or continue a personal journal - talk about feelings with a friend, family or spiritual advisor - practice positive thinking and self-talk I have placed a referral with [facility] to assist with connecting you with a mental health provider. they will contact you once a provider is located. -Update appointments attended.       Follow up:  Patient agrees to Care Plan and Follow-up.  Plan: The Managed Medicaid care management team will reach out to the patient again over the next 30 days.  Date/time of next scheduled Social Work care management/care coordination outreach:  05/28/21 at 11:00 am.  [name], BSW, MSW, [place] Medicaid LCSW [email or web address] Phone: [phone]

## 2021-05-11 ENCOUNTER — Other Ambulatory Visit: Payer: Self-pay | Admitting: Gastroenterology

## 2021-05-14 ENCOUNTER — Telehealth: Payer: Self-pay | Admitting: Physician Assistant

## 2021-05-14 ENCOUNTER — Other Ambulatory Visit: Payer: Self-pay

## 2021-05-14 ENCOUNTER — Inpatient Hospital Stay (HOSPITAL_COMMUNITY): Payer: Medicaid Other | Admitting: Vascular Surgery

## 2021-05-14 ENCOUNTER — Inpatient Hospital Stay (HOSPITAL_COMMUNITY): Payer: Medicaid Other

## 2021-05-14 ENCOUNTER — Inpatient Hospital Stay (HOSPITAL_COMMUNITY)
Admission: RE | Disposition: A | Discharge status: Home / Self Care | Payer: Self-pay | Source: Home / Self Care | Attending: Neurological Surgery

## 2021-05-14 ENCOUNTER — Inpatient Hospital Stay (HOSPITAL_COMMUNITY): Payer: Medicaid Other | Admitting: Certified Registered Nurse Anesthetist

## 2021-05-14 ENCOUNTER — Inpatient Hospital Stay (HOSPITAL_COMMUNITY)
Admission: RE | Admit: 2021-05-14 | Discharge: 2021-05-19 | DRG: 472 | Disposition: A | Discharge status: Home / Self Care | Payer: Medicaid Other | Attending: Neurological Surgery | Admitting: Neurological Surgery

## 2021-05-14 ENCOUNTER — Encounter (HOSPITAL_COMMUNITY): Payer: Self-pay | Admitting: Neurological Surgery

## 2021-05-14 DIAGNOSIS — M5412 Radiculopathy, cervical region: Secondary | ICD-10-CM | POA: Diagnosis not present

## 2021-05-14 DIAGNOSIS — I10 Essential (primary) hypertension: Secondary | ICD-10-CM | POA: Diagnosis not present

## 2021-05-14 DIAGNOSIS — Z20822 Contact with and (suspected) exposure to covid-19: Secondary | ICD-10-CM | POA: Diagnosis present

## 2021-05-14 DIAGNOSIS — M4802 Spinal stenosis, cervical region: Secondary | ICD-10-CM | POA: Diagnosis present

## 2021-05-14 DIAGNOSIS — I252 Old myocardial infarction: Secondary | ICD-10-CM

## 2021-05-14 DIAGNOSIS — Z833 Family history of diabetes mellitus: Secondary | ICD-10-CM

## 2021-05-14 DIAGNOSIS — J432 Centrilobular emphysema: Secondary | ICD-10-CM

## 2021-05-14 DIAGNOSIS — Z419 Encounter for procedure for purposes other than remedying health state, unspecified: Secondary | ICD-10-CM

## 2021-05-14 DIAGNOSIS — S129XXA Fracture of neck, unspecified, initial encounter: Secondary | ICD-10-CM | POA: Diagnosis present

## 2021-05-14 DIAGNOSIS — J449 Chronic obstructive pulmonary disease, unspecified: Secondary | ICD-10-CM | POA: Diagnosis not present

## 2021-05-14 DIAGNOSIS — Z803 Family history of malignant neoplasm of breast: Secondary | ICD-10-CM

## 2021-05-14 DIAGNOSIS — M960 Pseudarthrosis after fusion or arthrodesis: Principal | ICD-10-CM | POA: Diagnosis present

## 2021-05-14 DIAGNOSIS — I959 Hypotension, unspecified: Secondary | ICD-10-CM | POA: Diagnosis not present

## 2021-05-14 DIAGNOSIS — Z801 Family history of malignant neoplasm of trachea, bronchus and lung: Secondary | ICD-10-CM

## 2021-05-14 DIAGNOSIS — Z8041 Family history of malignant neoplasm of ovary: Secondary | ICD-10-CM | POA: Diagnosis not present

## 2021-05-14 DIAGNOSIS — N179 Acute kidney failure, unspecified: Secondary | ICD-10-CM | POA: Diagnosis not present

## 2021-05-14 DIAGNOSIS — K219 Gastro-esophageal reflux disease without esophagitis: Secondary | ICD-10-CM | POA: Diagnosis not present

## 2021-05-14 DIAGNOSIS — Z8249 Family history of ischemic heart disease and other diseases of the circulatory system: Secondary | ICD-10-CM

## 2021-05-14 DIAGNOSIS — I2510 Atherosclerotic heart disease of native coronary artery without angina pectoris: Secondary | ICD-10-CM | POA: Diagnosis not present

## 2021-05-14 DIAGNOSIS — Z981 Arthrodesis status: Secondary | ICD-10-CM | POA: Diagnosis not present

## 2021-05-14 DIAGNOSIS — M4722 Other spondylosis with radiculopathy, cervical region: Secondary | ICD-10-CM | POA: Diagnosis present

## 2021-05-14 DIAGNOSIS — G4730 Sleep apnea, unspecified: Secondary | ICD-10-CM | POA: Diagnosis not present

## 2021-05-14 DIAGNOSIS — I739 Peripheral vascular disease, unspecified: Secondary | ICD-10-CM | POA: Diagnosis present

## 2021-05-14 DIAGNOSIS — Z8673 Personal history of transient ischemic attack (TIA), and cerebral infarction without residual deficits: Secondary | ICD-10-CM

## 2021-05-14 DIAGNOSIS — M4322 Fusion of spine, cervical region: Secondary | ICD-10-CM | POA: Diagnosis not present

## 2021-05-14 DIAGNOSIS — F17210 Nicotine dependence, cigarettes, uncomplicated: Secondary | ICD-10-CM | POA: Diagnosis present

## 2021-05-14 HISTORY — PX: POSTERIOR CERVICAL FUSION/FORAMINOTOMY: SHX5038

## 2021-05-14 LAB — SARS CORONAVIRUS 2 BY RT PCR (HOSPITAL ORDER, PERFORMED IN ~~LOC~~ HOSPITAL LAB): SARS Coronavirus 2: NEGATIVE

## 2021-05-14 SURGERY — POSTERIOR CERVICAL FUSION/FORAMINOTOMY LEVEL 3
Anesthesia: General

## 2021-05-14 MED ORDER — ROCURONIUM BROMIDE 10 MG/ML (PF) SYRINGE
PREFILLED_SYRINGE | INTRAVENOUS | Status: AC
  Filled 2021-05-14: qty 10

## 2021-05-14 MED ORDER — LACTATED RINGERS IV SOLN: INTRAVENOUS | Status: DC

## 2021-05-14 MED ORDER — AMLODIPINE BESYLATE 5 MG PO TABS
5.0000 mg | ORAL_TABLET | Freq: Every day | ORAL | Status: DC
  Administered 2021-05-14 – 2021-05-18 (×5): 5 mg via ORAL
  Filled 2021-05-14 (×6): qty 1

## 2021-05-14 MED ORDER — LIDOCAINE-EPINEPHRINE 1 %-1:100000 IJ SOLN
INTRAMUSCULAR | Status: DC | PRN
  Administered 2021-05-14: 10 mL

## 2021-05-14 MED ORDER — METHOCARBAMOL 500 MG PO TABS
500.0000 mg | ORAL_TABLET | Freq: Three times a day (TID) | ORAL | Status: DC | PRN
  Administered 2021-05-14 – 2021-05-18 (×7): 500 mg via ORAL
  Filled 2021-05-14 (×7): qty 1

## 2021-05-14 MED ORDER — ORAL CARE MOUTH RINSE: 15.0000 mL | Freq: Once | OROMUCOSAL | Status: AC

## 2021-05-14 MED ORDER — FENTANYL CITRATE (PF) 250 MCG/5ML IJ SOLN
INTRAMUSCULAR | Status: DC | PRN
  Administered 2021-05-14 (×2): 100 ug via INTRAVENOUS
  Administered 2021-05-14: 50 ug via INTRAVENOUS

## 2021-05-14 MED ORDER — SODIUM CHLORIDE 0.9% FLUSH
3.0000 mL | Freq: Two times a day (BID) | INTRAVENOUS | Status: DC
  Administered 2021-05-14 – 2021-05-19 (×9): 3 mL via INTRAVENOUS

## 2021-05-14 MED ORDER — HYDROMORPHONE HCL 1 MG/ML IJ SOLN
INTRAMUSCULAR | Status: AC
  Filled 2021-05-14: qty 0.5

## 2021-05-14 MED ORDER — LIDOCAINE-EPINEPHRINE 1 %-1:100000 IJ SOLN
INTRAMUSCULAR | Status: AC
  Filled 2021-05-14: qty 1

## 2021-05-14 MED ORDER — OXYCODONE HCL 5 MG PO TABS
10.0000 mg | ORAL_TABLET | ORAL | Status: DC | PRN
  Administered 2021-05-16 – 2021-05-19 (×3): 10 mg via ORAL
  Filled 2021-05-14 (×4): qty 2

## 2021-05-14 MED ORDER — THROMBIN 5000 UNITS EX SOLR
CUTANEOUS | Status: AC
  Filled 2021-05-14: qty 5000

## 2021-05-14 MED ORDER — ROCURONIUM BROMIDE 10 MG/ML (PF) SYRINGE
PREFILLED_SYRINGE | INTRAVENOUS | Status: DC | PRN
  Administered 2021-05-14 (×2): 30 mg via INTRAVENOUS
  Administered 2021-05-14: 70 mg via INTRAVENOUS

## 2021-05-14 MED ORDER — FENTANYL CITRATE (PF) 100 MCG/2ML IJ SOLN
25.0000 ug | INTRAMUSCULAR | Status: DC | PRN
  Administered 2021-05-14 (×3): 50 ug via INTRAVENOUS

## 2021-05-14 MED ORDER — ACETAMINOPHEN 10 MG/ML IV SOLN
INTRAVENOUS | Status: DC | PRN
  Administered 2021-05-14: 1000 mg via INTRAVENOUS

## 2021-05-14 MED ORDER — DULOXETINE HCL 30 MG PO CPEP
30.0000 mg | ORAL_CAPSULE | Freq: Two times a day (BID) | ORAL | Status: DC
  Administered 2021-05-14 – 2021-05-19 (×10): 30 mg via ORAL
  Filled 2021-05-14 (×10): qty 1

## 2021-05-14 MED ORDER — TIOTROPIUM BROMIDE MONOHYDRATE 18 MCG IN CAPS: 18.0000 ug | ORAL_CAPSULE | Freq: Every day | RESPIRATORY_TRACT | Status: DC

## 2021-05-14 MED ORDER — SUCRALFATE 1 GM/10ML PO SUSP
1.0000 g | Freq: Four times a day (QID) | ORAL | Status: DC
  Administered 2021-05-14 – 2021-05-19 (×19): 1 g via ORAL
  Filled 2021-05-14 (×21): qty 10

## 2021-05-14 MED ORDER — GABAPENTIN 600 MG PO TABS
600.0000 mg | ORAL_TABLET | Freq: Four times a day (QID) | ORAL | Status: DC
  Administered 2021-05-14 – 2021-05-19 (×20): 600 mg via ORAL
  Filled 2021-05-14 (×20): qty 1

## 2021-05-14 MED ORDER — ONDANSETRON HCL 4 MG PO TABS: 4.0000 mg | ORAL_TABLET | Freq: Four times a day (QID) | ORAL | Status: DC | PRN

## 2021-05-14 MED ORDER — PHENOL 1.4 % MT LIQD: 1.0000 | OROMUCOSAL | Status: DC | PRN

## 2021-05-14 MED ORDER — ALBUTEROL SULFATE (2.5 MG/3ML) 0.083% IN NEBU: 2.5000 mg | INHALATION_SOLUTION | RESPIRATORY_TRACT | Status: DC | PRN

## 2021-05-14 MED ORDER — MIDAZOLAM HCL 5 MG/5ML IJ SOLN
INTRAMUSCULAR | Status: DC | PRN
  Administered 2021-05-14: 2 mg via INTRAVENOUS

## 2021-05-14 MED ORDER — DEXAMETHASONE SODIUM PHOSPHATE 10 MG/ML IJ SOLN
INTRAMUSCULAR | Status: AC
  Filled 2021-05-14: qty 1

## 2021-05-14 MED ORDER — HYDROMORPHONE HCL 1 MG/ML IJ SOLN
INTRAMUSCULAR | Status: DC | PRN
Start: 2021-05-14 — End: 2021-05-14
  Administered 2021-05-14: .5 mg via INTRAVENOUS

## 2021-05-14 MED ORDER — BACITRACIN ZINC 500 UNIT/GM EX OINT
TOPICAL_OINTMENT | CUTANEOUS | Status: AC
  Filled 2021-05-14: qty 28.35

## 2021-05-14 MED ORDER — DEXMEDETOMIDINE (PRECEDEX) IN NS 20 MCG/5ML (4 MCG/ML) IV SYRINGE
PREFILLED_SYRINGE | INTRAVENOUS | Status: DC | PRN
  Administered 2021-05-14: 20 ug via INTRAVENOUS

## 2021-05-14 MED ORDER — ACETAMINOPHEN 325 MG PO TABS
650.0000 mg | ORAL_TABLET | ORAL | Status: DC | PRN
  Administered 2021-05-18: 650 mg via ORAL
  Filled 2021-05-14: qty 2

## 2021-05-14 MED ORDER — ATORVASTATIN CALCIUM 10 MG PO TABS
20.0000 mg | ORAL_TABLET | Freq: Every evening | ORAL | Status: DC
  Administered 2021-05-14 – 2021-05-19 (×6): 20 mg via ORAL
  Filled 2021-05-14 (×6): qty 2

## 2021-05-14 MED ORDER — ONDANSETRON HCL 4 MG/2ML IJ SOLN
INTRAMUSCULAR | Status: AC
  Filled 2021-05-14: qty 2

## 2021-05-14 MED ORDER — PROPOFOL 10 MG/ML IV BOLUS
INTRAVENOUS | Status: AC
  Filled 2021-05-14: qty 20

## 2021-05-14 MED ORDER — ONDANSETRON HCL 4 MG/2ML IJ SOLN
INTRAMUSCULAR | Status: DC | PRN
  Administered 2021-05-14: 4 mg via INTRAVENOUS

## 2021-05-14 MED ORDER — CEFAZOLIN SODIUM-DEXTROSE 2-4 GM/100ML-% IV SOLN
2.0000 g | Freq: Three times a day (TID) | INTRAVENOUS | Status: AC
  Administered 2021-05-14 – 2021-05-15 (×2): 2 g via INTRAVENOUS
  Filled 2021-05-14 (×2): qty 100

## 2021-05-14 MED ORDER — MIDAZOLAM HCL 2 MG/2ML IJ SOLN
INTRAMUSCULAR | Status: AC
  Filled 2021-05-14: qty 2

## 2021-05-14 MED ORDER — FENTANYL CITRATE (PF) 250 MCG/5ML IJ SOLN
INTRAMUSCULAR | Status: AC
  Filled 2021-05-14: qty 5

## 2021-05-14 MED ORDER — FENTANYL CITRATE (PF) 100 MCG/2ML IJ SOLN
INTRAMUSCULAR | Status: AC
  Filled 2021-05-14: qty 2

## 2021-05-14 MED ORDER — LIDOCAINE 2% (20 MG/ML) 5 ML SYRINGE
INTRAMUSCULAR | Status: DC | PRN
  Administered 2021-05-14: 100 mg via INTRAVENOUS

## 2021-05-14 MED ORDER — 0.9 % SODIUM CHLORIDE (POUR BTL) OPTIME
TOPICAL | Status: DC | PRN
  Administered 2021-05-14: 1000 mL

## 2021-05-14 MED ORDER — LIDOCAINE 2% (20 MG/ML) 5 ML SYRINGE
INTRAMUSCULAR | Status: AC
  Filled 2021-05-14: qty 5

## 2021-05-14 MED ORDER — PHENYLEPHRINE 40 MCG/ML (10ML) SYRINGE FOR IV PUSH (FOR BLOOD PRESSURE SUPPORT)
PREFILLED_SYRINGE | INTRAVENOUS | Status: AC
  Filled 2021-05-14: qty 10

## 2021-05-14 MED ORDER — LISINOPRIL 20 MG PO TABS
40.0000 mg | ORAL_TABLET | Freq: Every evening | ORAL | Status: DC
  Administered 2021-05-14 – 2021-05-18 (×4): 40 mg via ORAL
  Filled 2021-05-14 (×6): qty 2

## 2021-05-14 MED ORDER — ALBUTEROL SULFATE HFA 108 (90 BASE) MCG/ACT IN AERS
1.0000 | INHALATION_SPRAY | RESPIRATORY_TRACT | Status: DC | PRN
Start: 2021-05-14 — End: 2021-05-14

## 2021-05-14 MED ORDER — HYDROMORPHONE HCL 1 MG/ML IJ SOLN
1.0000 mg | INTRAMUSCULAR | Status: DC | PRN
  Administered 2021-05-15 – 2021-05-19 (×15): 1 mg via INTRAVENOUS
  Filled 2021-05-14 (×16): qty 1

## 2021-05-14 MED ORDER — BACITRACIN ZINC 500 UNIT/GM EX OINT
TOPICAL_OINTMENT | CUTANEOUS | Status: DC | PRN
  Administered 2021-05-14: 1 via TOPICAL

## 2021-05-14 MED ORDER — UMECLIDINIUM BROMIDE 62.5 MCG/ACT IN AEPB
1.0000 | INHALATION_SPRAY | Freq: Every day | RESPIRATORY_TRACT | Status: DC
  Administered 2021-05-15 – 2021-05-19 (×5): 1 via RESPIRATORY_TRACT
  Filled 2021-05-14: qty 7

## 2021-05-14 MED ORDER — METOPROLOL TARTRATE 25 MG PO TABS
25.0000 mg | ORAL_TABLET | Freq: Two times a day (BID) | ORAL | Status: DC
  Administered 2021-05-14 – 2021-05-18 (×8): 25 mg via ORAL
  Filled 2021-05-14 (×10): qty 1

## 2021-05-14 MED ORDER — NITROGLYCERIN 0.4 MG SL SUBL: 0.4000 mg | SUBLINGUAL_TABLET | SUBLINGUAL | Status: DC | PRN

## 2021-05-14 MED ORDER — PROPOFOL 10 MG/ML IV BOLUS
INTRAVENOUS | Status: DC | PRN
  Administered 2021-05-14: 160 mg via INTRAVENOUS
  Administered 2021-05-14: 50 mg via INTRAVENOUS

## 2021-05-14 MED ORDER — THROMBIN 5000 UNITS EX SOLR
OROMUCOSAL | Status: DC | PRN
  Administered 2021-05-14 (×2): 5 mL via TOPICAL

## 2021-05-14 MED ORDER — LINACLOTIDE 145 MCG PO CAPS
290.0000 ug | ORAL_CAPSULE | Freq: Every day | ORAL | Status: DC
Start: 2021-05-15 — End: 2021-05-20
  Administered 2021-05-16 – 2021-05-19 (×3): 290 ug via ORAL
  Filled 2021-05-14 (×6): qty 2

## 2021-05-14 MED ORDER — OXYCODONE HCL 5 MG PO TABS
15.0000 mg | ORAL_TABLET | ORAL | Status: DC | PRN
  Administered 2021-05-14 – 2021-05-19 (×18): 15 mg via ORAL
  Filled 2021-05-14 (×17): qty 3

## 2021-05-14 MED ORDER — CEFAZOLIN SODIUM-DEXTROSE 2-4 GM/100ML-% IV SOLN
2.0000 g | INTRAVENOUS | Status: AC
  Administered 2021-05-14: 2 g via INTRAVENOUS
  Filled 2021-05-14: qty 100

## 2021-05-14 MED ORDER — MOMETASONE FURO-FORMOTEROL FUM 200-5 MCG/ACT IN AERO
2.0000 | INHALATION_SPRAY | Freq: Two times a day (BID) | RESPIRATORY_TRACT | Status: DC
  Administered 2021-05-14 – 2021-05-19 (×10): 2 via RESPIRATORY_TRACT
  Filled 2021-05-14: qty 8.8

## 2021-05-14 MED ORDER — CYCLOBENZAPRINE HCL 10 MG PO TABS: 10.0000 mg | ORAL_TABLET | Freq: Three times a day (TID) | ORAL | Status: DC | PRN

## 2021-05-14 MED ORDER — DEXAMETHASONE SODIUM PHOSPHATE 10 MG/ML IJ SOLN
INTRAMUSCULAR | Status: DC | PRN
  Administered 2021-05-14: 10 mg via INTRAVENOUS

## 2021-05-14 MED ORDER — LACTATED RINGERS IV SOLN: INTRAVENOUS | Status: DC | PRN

## 2021-05-14 MED ORDER — PHENYLEPHRINE HCL-NACL 20-0.9 MG/250ML-% IV SOLN
INTRAVENOUS | Status: DC | PRN
  Administered 2021-05-14: 5 ug/min via INTRAVENOUS

## 2021-05-14 MED ORDER — SODIUM CHLORIDE 0.9% FLUSH: 3.0000 mL | INTRAVENOUS | Status: DC | PRN

## 2021-05-14 MED ORDER — CHLORHEXIDINE GLUCONATE CLOTH 2 % EX PADS: 6.0000 | MEDICATED_PAD | Freq: Once | CUTANEOUS | Status: DC

## 2021-05-14 MED ORDER — POLYETHYLENE GLYCOL 3350 17 G PO PACK: 17.0000 g | PACK | Freq: Every day | ORAL | Status: DC | PRN

## 2021-05-14 MED ORDER — LABETALOL HCL 5 MG/ML IV SOLN
INTRAVENOUS | Status: AC
  Filled 2021-05-14: qty 4

## 2021-05-14 MED ORDER — PANTOPRAZOLE SODIUM 40 MG PO TBEC
40.0000 mg | DELAYED_RELEASE_TABLET | Freq: Every day | ORAL | Status: DC
  Administered 2021-05-15 – 2021-05-19 (×5): 40 mg via ORAL
  Filled 2021-05-14 (×5): qty 1

## 2021-05-14 MED ORDER — ONDANSETRON HCL 4 MG/2ML IJ SOLN: 4.0000 mg | Freq: Four times a day (QID) | INTRAMUSCULAR | Status: DC | PRN

## 2021-05-14 MED ORDER — HYDROXYZINE HCL 25 MG PO TABS
50.0000 mg | ORAL_TABLET | Freq: Every day | ORAL | Status: DC
  Administered 2021-05-14 – 2021-05-18 (×5): 50 mg via ORAL
  Filled 2021-05-14 (×5): qty 2

## 2021-05-14 MED ORDER — SODIUM CHLORIDE 0.9 % IV SOLN
250.0000 mL | INTRAVENOUS | Status: DC
  Administered 2021-05-14: 250 mL via INTRAVENOUS

## 2021-05-14 MED ORDER — FENTANYL CITRATE (PF) 100 MCG/2ML IJ SOLN
25.0000 ug | INTRAMUSCULAR | Status: DC | PRN
  Administered 2021-05-14: 50 ug via INTRAVENOUS

## 2021-05-14 MED ORDER — LABETALOL HCL 5 MG/ML IV SOLN
INTRAVENOUS | Status: DC | PRN
  Administered 2021-05-14 (×4): 2.5 mg via INTRAVENOUS

## 2021-05-14 MED ORDER — ACETAMINOPHEN 650 MG RE SUPP: 650.0000 mg | RECTAL | Status: DC | PRN

## 2021-05-14 MED ORDER — DOCUSATE SODIUM 100 MG PO CAPS
100.0000 mg | ORAL_CAPSULE | Freq: Two times a day (BID) | ORAL | Status: DC
  Administered 2021-05-14 – 2021-05-19 (×10): 100 mg via ORAL
  Filled 2021-05-14 (×10): qty 1

## 2021-05-14 MED ORDER — CHLORHEXIDINE GLUCONATE 0.12 % MT SOLN
15.0000 mL | Freq: Once | OROMUCOSAL | Status: AC
  Administered 2021-05-14: 15 mL via OROMUCOSAL
  Filled 2021-05-14: qty 15

## 2021-05-14 MED ORDER — PHENYLEPHRINE HCL (PRESSORS) 10 MG/ML IV SOLN
INTRAVENOUS | Status: DC | PRN
  Administered 2021-05-14: 40 ug via INTRAVENOUS
  Administered 2021-05-14: 80 ug via INTRAVENOUS
  Administered 2021-05-14: 160 ug via INTRAVENOUS
  Administered 2021-05-14 (×2): 80 ug via INTRAVENOUS
  Administered 2021-05-14: 40 ug via INTRAVENOUS
  Administered 2021-05-14: 80 ug via INTRAVENOUS

## 2021-05-14 MED ORDER — DEXMEDETOMIDINE (PRECEDEX) IN NS 20 MCG/5ML (4 MCG/ML) IV SYRINGE
PREFILLED_SYRINGE | INTRAVENOUS | Status: AC
  Filled 2021-05-14: qty 5

## 2021-05-14 MED ORDER — MENTHOL 3 MG MT LOZG: 1.0000 | LOZENGE | OROMUCOSAL | Status: DC | PRN

## 2021-05-14 SURGICAL SUPPLY — 52 items
BAG COUNTER SPONGE SURGICOUNT (BAG) ×4 IMPLANT
BENZOIN TINCTURE PRP APPL 2/3 (GAUZE/BANDAGES/DRESSINGS) IMPLANT
BIT DRILL 2.4 (BIT) ×2 IMPLANT
BLADE CLIPPER SURG (BLADE) ×2 IMPLANT
BUR MATCHSTICK NEURO 3.0 LAGG (BURR) ×2 IMPLANT
BUR PRECISION FLUTE 5.0 (BURR) IMPLANT
CANISTER SUCT 3000ML PPV (MISCELLANEOUS) ×2 IMPLANT
DECANTER SPIKE VIAL GLASS SM (MISCELLANEOUS) ×2 IMPLANT
DERMABOND ADVANCED (GAUZE/BANDAGES/DRESSINGS) ×1
DERMABOND ADVANCED .7 DNX12 (GAUZE/BANDAGES/DRESSINGS) ×1 IMPLANT
DRAPE C-ARM 42X72 X-RAY (DRAPES) ×4 IMPLANT
DRAPE LAPAROTOMY 100X72 PEDS (DRAPES) ×2 IMPLANT
DURAPREP 6ML APPLICATOR 50/CS (WOUND CARE) ×2 IMPLANT
ELECT REM PT RETURN 9FT ADLT (ELECTROSURGICAL) ×2
ELECTRODE REM PT RTRN 9FT ADLT (ELECTROSURGICAL) ×1 IMPLANT
GAUZE 4X4 16PLY ~~LOC~~+RFID DBL (SPONGE) ×2 IMPLANT
GAUZE SPONGE 4X4 12PLY STRL (GAUZE/BANDAGES/DRESSINGS) IMPLANT
GLOVE EXAM NITRILE LRG STRL (GLOVE) IMPLANT
GLOVE EXAM NITRILE XL STR (GLOVE) IMPLANT
GLOVE EXAM NITRILE XS STR PU (GLOVE) IMPLANT
GLOVE SURG LTX SZ7.5 (GLOVE) ×2 IMPLANT
GLOVE SURG UNDER POLY LF SZ7.5 (GLOVE) ×6 IMPLANT
GOWN STRL REUS W/ TWL LRG LVL3 (GOWN DISPOSABLE) ×1 IMPLANT
GOWN STRL REUS W/ TWL XL LVL3 (GOWN DISPOSABLE) ×1 IMPLANT
GOWN STRL REUS W/TWL 2XL LVL3 (GOWN DISPOSABLE) IMPLANT
GOWN STRL REUS W/TWL LRG LVL3 (GOWN DISPOSABLE) ×2
GOWN STRL REUS W/TWL XL LVL3 (GOWN DISPOSABLE) ×2
HEMOSTAT POWDER KIT SURGIFOAM (HEMOSTASIS) ×4 IMPLANT
KIT BASIN OR (CUSTOM PROCEDURE TRAY) ×2 IMPLANT
KIT TURNOVER KIT B (KITS) ×2 IMPLANT
NEEDLE HYPO 22GX1.5 SAFETY (NEEDLE) ×2 IMPLANT
NS IRRIG 1000ML POUR BTL (IV SOLUTION) ×2 IMPLANT
PACK LAMINECTOMY NEURO (CUSTOM PROCEDURE TRAY) ×2 IMPLANT
PAD ARMBOARD 7.5X6 YLW CONV (MISCELLANEOUS) IMPLANT
PIN MAYFIELD SKULL DISP (PIN) ×2 IMPLANT
ROD PRECUT 3.5X60 (Rod) ×4 IMPLANT
SCREW MULTI AXIAL 3.5X14MM (Screw) ×8 IMPLANT
SCREW MULTI AXIAL 4.5X22 (Screw) ×4 IMPLANT
SET SCREW INFINITY IFIX THOR (Screw) ×12 IMPLANT
SPONGE T-LAP 4X18 ~~LOC~~+RFID (SPONGE) ×2 IMPLANT
STAPLER VISISTAT 35W (STAPLE) ×2 IMPLANT
SUT ETHILON 3 0 FSL (SUTURE) IMPLANT
SUT MNCRL AB 3-0 PS2 18 (SUTURE) ×2 IMPLANT
SUT MON AB 3-0 SH 27 (SUTURE) ×2
SUT MON AB 3-0 SH27 (SUTURE) ×1 IMPLANT
SUT VIC AB 0 CT1 18XCR BRD8 (SUTURE) ×1 IMPLANT
SUT VIC AB 0 CT1 8-18 (SUTURE) ×1
SUT VIC AB 2-0 CP2 18 (SUTURE) ×2 IMPLANT
TOWEL GREEN STERILE (TOWEL DISPOSABLE) ×2 IMPLANT
TOWEL GREEN STERILE FF (TOWEL DISPOSABLE) ×2 IMPLANT
UNDERPAD 30X36 HEAVY ABSORB (UNDERPADS AND DIAPERS) IMPLANT
WATER STERILE IRR 1000ML POUR (IV SOLUTION) ×2 IMPLANT

## 2021-05-14 NOTE — Telephone Encounter (Signed)
Converted to rf req

## 2021-05-14 NOTE — Anesthesia Postprocedure Evaluation (Signed)
Anesthesia Post Note  Patient: Sara Munoz  Procedure(s) Performed: Cervical five, Cervical six Laminectomy,foraminotomy with Cervical five-six Cervical six-seven Cervical seven-Thoracic one Posterior instrumented fusion     Patient location during evaluation: PACU Anesthesia Type: General Level of consciousness: sedated Pain management: pain level controlled Vital Signs Assessment: post-procedure vital signs reviewed and stable Respiratory status: spontaneous breathing and respiratory function stable Cardiovascular status: stable Postop Assessment: no apparent nausea or vomiting Anesthetic complications: no   No notable events documented.  Last Vitals:  Vitals:   05/14/21 1945 05/14/21 2000  BP: 133/72 (!) 144/88  Pulse: 62 72  Resp: 14 19  Temp:  37.1 C  SpO2: 96% 95%    Last Pain:  Vitals:   05/14/21 2000  PainSc: [place]

## 2021-05-14 NOTE — H&P (Signed)
Surgical H&P Update  HPI: 55 y.o. woman with a history of cervical radiculopathy with neck pain due to pseudoarthrosis. No changes in health since she was last seen. Still having bilateral upper extremity pain, numbness, and axial neck pain and wishes to proceed with surgery.  PMHx:  Past Medical History:  Diagnosis Date   Anemia    Anxiety    Asthma    Atypical chest pain 08/02/2015   CAD (coronary artery disease)    Chronic back pain    COPD (chronic obstructive pulmonary disease) (HCC)    Coronary artery disease    a. Mild-non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/2014   GERD (gastroesophageal reflux disease)    Hepatitis C    per patient, "took medicines for it and no longer has it"   Hypercholesteremia    Hypertension    Hypokalemia    Hypomagnesemia 01/04/2014   Liver disease    MI (myocardial infarction) ([place])    Nerve pain    per patient, in the lower back   Opiate use 02/27/2016   Osteoarthritis    Pneumonia    Stroke [facility])    FamHx:  Family History  Problem Relation Age of Onset   Heart disease Mother    Lung cancer Mother    Ovarian cancer Mother    Healthy Brother    Healthy Brother    Healthy Brother    Diabetes Maternal Uncle    Breast cancer Maternal Aunt    SocHx:  reports that she has been smoking cigarettes. She has a 17.50 pack-year smoking history. She has never used smokeless tobacco. She reports that she does not currently use alcohol. She reports that she does not currently use drugs after having used the following drugs: Marijuana.  Physical Exam: Strength 5/5 x4, SILTx4 except bilateral hand numbness, no hoffman's, no clonus  Assesment/Plan: 55 y.o. woman with cervical pseudoarthrosis and radiculopathy, here for posterior cervical decompression from C5 to C7 and instrumented fusion from C5-T1. Risks, benefits, and alternatives discussed and the patient would like to continue with surgery.  -OR today -3C post-op  Judith Part,  MD 05/14/21 3:26 PM

## 2021-05-14 NOTE — Op Note (Signed)
PATIENT: Sara Munoz  DAY OF SURGERY: 05/14/21   PRE-OPERATIVE DIAGNOSIS:  Cervical pseudoarthrosis, cervical radiculopathy   POST-OPERATIVE DIAGNOSIS:  Same   PROCEDURE:  C5-6, C6-7 laminectomies with bilateral foraminotomies; C5-6, C6-7, C7-T1 posterior instrumented spinal fusion   SURGEON:  Surgeon(s) and Role:    Judith Part, MD - Primary   ANESTHESIA: ETGA   BRIEF HISTORY: This is a 55 year old woman in whom I previously performed two ACDFs that presented with neck pain and recurrent cervical radiculopathy. Workup showed pseudoarthrosis at these levels with some progression of degenerative spondylosis causing recurrent canal and foraminal stenosis. I therefore recommended posterior decompression and instrumented fusion. This was discussed with the patient as well as risks, benefits, and alternatives and wished to proceed with surgery.   OPERATIVE DETAIL: The patient was taken to the operating room, anesthesia was induced by the anesthesia team, the mayfield head holder was applied and the patient was placed on the OR table in the prone position. All pressure points were padded and the [place] head holder was secured to the table. A formal time out was performed with two patient identifiers and confirmed the operative site. The operative site was marked, hair was clipped with surgical clippers, the area was then prepped and draped in a sterile fashion.   A linear incision was placed in the midline at the cervicothoracic junction. Soft tissues were dissected in a subperiosteal fashion to expose the posterior elements of C5, C6, C7, and T1. Fluoroscopy was again used to confirm the correct levels prior to decompression. Laminectomies and bilateral foraminotomies were performed at C5 and C6 with a combination of high speed drill and curettes. With decompression complete, attention was turned to instrumentation.  Using standard landmarks, 41mm lateral mass screws were placed  bilaterally at C5 and C6. [employer] hardware was used for the entirety of this case. These were placed by using a burr to drill through the cortex then using a CD4 drill with depth stop to drill the trajectory, followed by palpation, tapping, palpation again for breaches, then screw placement. At T1, 4.35mm x 25mm pedicle screws were placed bilaterally with fluoroscopic guidance. These were placed by cortical burr, use of a pedicle awl to cannulate the pedicle, palpation for breaches, tapping, repeat palpation, and then screw placement with fluoroscopic images taken as needed to guide placement.  Fluoroscopy was used to confirm hardware location as well as surgical level, a high speed drill was used to decorticate the lateral masses and autologous bone graft was morselized and used as autograft to perform a fusion across C5-6/C6-7/C7-T1. Rods were placed bilaterally, caps were connected, then torqued to manufacturer specifications during final tightening. Hemostasis was obtained and confirmed, the wound was copiously irrigated, all instrument and sponge counts were correct, the incision was then closed in layers. The patient was then returned to anesthesia for emergence. No apparent complications at the completion of the procedure.   EBL:  411mL   DRAINS: none   SPECIMENS: none   Judith Part, MD 05/14/21 3:28 PM

## 2021-05-14 NOTE — Transfer of Care (Signed)
Immediate Anesthesia Transfer of Care Note  Patient: Sara Munoz  Procedure(s) Performed: Cervical five, Cervical six Laminectomy,foraminotomy with Cervical five-six Cervical six-seven Cervical seven-Thoracic one Posterior instrumented fusion  Patient Location: PACU  Anesthesia Type:General  Level of Consciousness: awake, alert , oriented and pateint uncooperative  Airway & Oxygen Therapy: Patient Spontanous Breathing and Patient connected to face mask oxygen  Post-op Assessment: Report given to RN, Post -op Vital signs reviewed and stable, Patient moving all extremities X 4 and Patient able to stick tongue midline  Post vital signs: stable  Last Vitals:  Vitals Value Taken Time  BP 144/75 05/14/21 1831  Temp 97.8   Pulse 75 05/14/21 1835  Resp 25 05/14/21 1835  SpO2 99 % 05/14/21 1835  Vitals shown include unvalidated device data.  Last Pain:  Vitals:   05/14/21 1158  PainSc: 7       Patients Stated Pain Goal: 4 (65/46/50 3546)  Complications: No notable events documented.

## 2021-05-14 NOTE — Telephone Encounter (Signed)
[facility] faxed refill request for the following medications:   SPIRIVA RESPIMAT 2.5 MCG/ACT AERS   Please advise.

## 2021-05-14 NOTE — Anesthesia Procedure Notes (Signed)
Procedure Name: Intubation Date/Time: 05/14/2021 3:52 PM Performed by: Maude Leriche, CRNA Pre-anesthesia Checklist: Patient identified, Emergency Drugs available, Suction available and Patient being monitored Patient Re-evaluated:Patient Re-evaluated prior to induction Oxygen Delivery Method: Circle system utilized Preoxygenation: Pre-oxygenation with 100% oxygen Induction Type: IV induction Ventilation: Mask ventilation without difficulty Laryngoscope Size: Glidescope and 3 Grade View: Grade I Tube type: Oral Tube size: 7.0 mm Number of attempts: 1 Airway Equipment and Method: Stylet and Video-laryngoscopy Placement Confirmation: ETT inserted through vocal cords under direct vision, positive ETCO2 and breath sounds checked- equal and bilateral Secured at: 21 cm Tube secured with: Tape Dental Injury: Teeth and Oropharynx as per pre-operative assessment  Difficulty Due To: Difficult Airway-  due to neck instability

## 2021-05-15 LAB — CBC
HCT: 31.8 % — ABNORMAL LOW (ref 36.0–46.0)
Hemoglobin: 10.2 g/dL — ABNORMAL LOW (ref 12.0–15.0)
MCH: 26 pg (ref 26.0–34.0)
MCHC: 32.1 g/dL (ref 30.0–36.0)
MCV: 81.1 fL (ref 80.0–100.0)
Platelets: 218 10*3/uL (ref 150–400)
RBC: 3.92 MIL/uL (ref 3.87–5.11)
RDW: 16.5 % — ABNORMAL HIGH (ref 11.5–15.5)
WBC: 13.8 10*3/uL — ABNORMAL HIGH (ref 4.0–10.5)
nRBC: 0 % (ref 0.0–0.2)

## 2021-05-15 LAB — COMPREHENSIVE METABOLIC PANEL
ALT: 14 U/L (ref 0–44)
AST: 27 U/L (ref 15–41)
Albumin: 3.2 g/dL — ABNORMAL LOW (ref 3.5–5.0)
Alkaline Phosphatase: 57 U/L (ref 38–126)
Anion gap: 9 (ref 5–15)
BUN: 17 mg/dL (ref 6–20)
CO2: 24 mmol/L (ref 22–32)
Calcium: 8.2 mg/dL — ABNORMAL LOW (ref 8.9–10.3)
Chloride: 101 mmol/L (ref 98–111)
Creatinine, Ser: 1.22 mg/dL — ABNORMAL HIGH (ref 0.44–1.00)
GFR, Estimated: 53 mL/min — ABNORMAL LOW (ref >60)
Glucose, Bld: 122 mg/dL — ABNORMAL HIGH (ref 70–99)
Potassium: 4.4 mmol/L (ref 3.5–5.1)
Sodium: 134 mmol/L — ABNORMAL LOW (ref 135–145)
Total Bilirubin: 0.6 mg/dL (ref 0.3–1.2)
Total Protein: 6.1 g/dL — ABNORMAL LOW (ref 6.5–8.1)

## 2021-05-15 LAB — PROTIME-INR
INR: 1 (ref 0.8–1.2)
Prothrombin Time: 13.5 seconds (ref 11.4–15.2)

## 2021-05-15 LAB — APTT: aPTT: 29 seconds (ref 24–36)

## 2021-05-15 MED ORDER — SODIUM CHLORIDE 0.9 % IV BOLUS
500.0000 mL | Freq: Once | INTRAVENOUS | Status: AC
  Administered 2021-05-15: 500 mL via INTRAVENOUS

## 2021-05-15 MED ORDER — SODIUM CHLORIDE 0.9 % IV SOLN
INTRAVENOUS | Status: DC
Start: 2021-05-15 — End: 2021-05-16

## 2021-05-15 MED FILL — Thrombin For Soln 5000 Unit: CUTANEOUS | Qty: 5000 | Status: AC

## 2021-05-15 NOTE — Evaluation (Signed)
Occupational Therapy Evaluation Patient Details Name: Sara Munoz MRN: [id number] DOB: 1965/08/12 Today's Date: 05/15/2021   History of Present Illness 55 yo female s/p  C5-6, C6-7 laminectomies with bilateral foraminotomies; C5-6, C6-7, C7-T1 posterior instrumented spinal fusion 11/22. PMH includes anxiety, GERD, COPD, CAD with history of MI and cath, hep C, HTN, OA, CVA.   Clinical Impression   Patient is s/p back surgery resulting in functional limitations due to the deficits listed below (see OT problem list). Pt limited by pain and also drowzy. Pt noted to have BP 105/60 ( 75) in chair at this time. Pt with chair alarm on as patient was attempting to exit the bed on arrival without (A).  Patient will benefit from skilled OT acutely to increase independence and safety with ADLS to allow discharge [place].       Recommendations for follow up therapy are one component of a multi-disciplinary discharge planning process, led by the attending physician.  Recommendations may be updated based on patient status, additional functional criteria and insurance authorization.   Follow Up Recommendations  Home health OT    Assistance Recommended at Discharge Set up Supervision/Assistance  Functional Status Assessment  Patient has had a recent decline in their functional status and demonstrates the ability to make significant improvements in function in a reasonable and predictable amount of time.  Equipment Recommendations       Recommendations for Other Services       Precautions / Restrictions Precautions Precautions: Cervical Precaution Comments: handout for cervical precautions provided. no bracing at this time requesting soft collar and ordered via Dr Zada Finders      Mobility Bed Mobility Overal bed mobility: Needs Assistance Bed Mobility: Rolling;Supine to Sit Rolling: Min guard   Supine to sit: Mod assist     General bed mobility comments: pt attempting to exit the bed on L  side on arrival. pt with bed adjusted and (A) to elevated trunk from surface    Transfers Overall transfer level: Needs assistance Equipment used: 1 person hand held assist Transfers: Sit to/from Stand Sit to Stand: Min assist                  Balance Overall balance assessment: Needs assistance Sitting-balance support: Bilateral upper extremity supported;Feet supported Sitting balance-Leahy Scale: Fair     Standing balance support: Bilateral upper extremity supported;During functional activity Standing balance-Leahy Scale: Poor                             ADL either performed or assessed with clinical judgement   ADL Overall ADL's : Needs assistance/impaired Eating/Feeding: Set up Eating/Feeding Details (indicate cue type and reason): drinking soda from cup Grooming: Minimal assistance Grooming Details (indicate cue type and reason): sitting recommended based on balance at eval Upper Body Bathing: Minimal assistance   Lower Body Bathing: Moderate assistance   Upper Body Dressing : Minimal assistance   Lower Body Dressing: Moderate assistance   Toilet Transfer: Minimal assistance           Functional mobility during ADLs: Minimal assistance General ADL Comments: pt limited by neck pain and requesting cervical collar from MD for comfort. Dr Zada Finders in agreement. Collar comfort. Pt with hair braided and loose hair removed from head.     Vision Baseline Vision/History: 1 Wears glasses (glasses for reading only) Ability to See in Adequate Light: 0 Adequate       Perception  Praxis      Pertinent Vitals/Pain Pain Assessment: Faces Faces Pain Scale: Hurts whole lot Pain Location: neck Pain Descriptors / Indicators: Discomfort;Grimacing;Guarding Pain Intervention(s): Premedicated before session;Monitored during session;Repositioned (Rn giving meds toward end of session)     Hand Dominance Right   Extremity/Trunk Assessment Upper  Extremity Assessment Upper Extremity Assessment: RUE deficits/detail;LUE deficits/detail RUE Deficits / Details: weakness/ numbness/ unable to thumb to 5th digit RUE Sensation: decreased light touch RUE Coordination: decreased fine motor;decreased gross motor LUE Deficits / Details: weakness/ numbness/ unable to thumb to 5th digit= more numb than R UE LUE Sensation: decreased light touch LUE Coordination: decreased fine motor;decreased gross motor   Lower Extremity Assessment Lower Extremity Assessment: Defer to PT evaluation   Cervical / Trunk Assessment Cervical / Trunk Assessment: Neck Surgery   Communication Communication Communication: No difficulties   Cognition Arousal/Alertness: Lethargic Behavior During Therapy: Restless Overall Cognitive Status: Impaired/Different from baseline Area of Impairment: Awareness;Safety/judgement                         Safety/Judgement: Decreased awareness of safety;Decreased awareness of deficits Awareness: Emergent   General Comments: pt asking for lunch menu to order and then patient falling asleep holding the menu     General Comments  incision dry and no dressing at this time. Hair pulled to the side to keep dressing clear of hair    Exercises Exercises: Other exercises Other Exercises Other Exercises: hand assessment- finger pad to pad exercises   Shoulder Instructions      Home Living Family/patient expects to be discharged to:: Private residence Living Arrangements: Spouse/significant other Available Help at Discharge: Family Type of Home: House Home Access: Stairs to enter [employer] of Steps: 2 step through front ( no rails) 5 steps in back door with rails Entrance Stairs-Rails: None Home Layout: One level     Bathroom Shower/Tub: Teacher, early years/pre: Standard Bathroom Accessibility: Yes   Home Equipment: None   Additional Comments: x2 dogs ( [place] / Mel Almond -big dogs)  boyfriend stays 2-3 nights a week but works during the day.      Prior Functioning/Environment Prior Level of Function : Independent/Modified Independent             Mobility Comments: furniture walks per patient          OT Problem List: Decreased strength;Decreased activity tolerance;Impaired balance (sitting and/or standing);Pain;Decreased cognition;Decreased safety awareness;Decreased knowledge of use of DME or AE;Decreased knowledge of precautions      OT Treatment/Interventions: Self-care/ADL training;Energy conservation;DME and/or AE instruction;Therapeutic activities;Cognitive remediation/compensation;Patient/family education;Balance training;Neuromuscular education;Therapeutic exercise;Manual therapy;Modalities    OT Goals(Current goals can be found in the care plan section) Acute Rehab OT Goals Patient Stated Goal: none stated OT Goal Formulation: With patient Time For Goal Achievement: 05/29/21 Potential to Achieve Goals: Good  OT Frequency: Min 2X/week   Barriers to D/C:            Co-evaluation              AM-PAC OT "6 Clicks" Daily Activity     Outcome Measure Help from another person eating meals?: A Little Help from another person taking care of personal grooming?: A Little Help from another person toileting, which includes using toliet, bedpan, or urinal?: A Little Help from another person bathing (including washing, rinsing, drying)?: A Little Help from another person to put on and taking off regular upper body clothing?: A Little Help from another  person to put on and taking off regular lower body clothing?: A Little 6 Click Score: 18   End of Session Nurse Communication: Mobility status;Precautions  Activity Tolerance: Patient limited by pain Patient left: in chair;with chair alarm set;with call bell/phone within reach  OT Visit Diagnosis: Unsteadiness on feet (R26.81);Muscle weakness (generalized) (M62.81);Pain                Time:  8022-3361 OT Time Calculation (min): 19 min Charges:  OT General Charges $OT Visit: 1 Visit OT Evaluation $OT Eval Moderate Complexity: 1 Mod   Brynn, OTR/L  Acute Rehabilitation Services Pager: [phone] Office: [phone] .   Jeri Modena 05/15/2021, 10:45 AM

## 2021-05-15 NOTE — Evaluation (Signed)
Physical Therapy Evaluation Patient Details Name: Sara Munoz MRN: [id number] DOB: [birth year] Today's Date: 05/15/2021  History of Present Illness  55 yo female s/p  C5-6, C6-7 laminectomies with bilateral foraminotomies; C5-6, C6-7, C7-T1 posterior instrumented spinal fusion 11/22. PMH includes anxiety, GERD, COPD, CAD with history of MI and cath, hep C, HTN, OA, CVA.  Clinical Impression  Pt presents with generalized weakness, severe post-op neck pain, min difficulty performing mobility tasks, decreased knowledge and application of precautions, and decreased activity tolerance. Pt to benefit from acute PT to address deficits. Pt ambulated short hallway distance, limited in distance by pain and pt-reported dizziness (SBP 90s-100s throughout session, no orthostatic hypotension). PT to progress mobility as tolerated, and will continue to follow acutely.         Recommendations for follow up therapy are one component of a multi-disciplinary discharge planning process, led by the attending physician.  Recommendations may be updated based on patient status, additional functional criteria and insurance authorization.  Follow Up Recommendations Home health PT    Assistance Recommended at Discharge Frequent or constant Supervision/Assistance  Functional Status Assessment Patient has had a recent decline in their functional status and demonstrates the ability to make significant improvements in function in a reasonable and predictable amount of time.  Equipment Recommendations  Rolling walker (2 wheels)    Recommendations for Other Services       Precautions / Restrictions Precautions Precautions: Cervical Precaution Comments: reviewed cervical precautions Required Braces or Orthoses: Cervical Brace Cervical Brace: Soft collar;Other (comment) (ordered after OT)      Mobility  Bed Mobility Overal bed mobility: Needs Assistance Bed Mobility: Rolling;Supine to Sit Rolling: Min assist    Supine to sit: Mod assist     General bed mobility comments: assist for log roll technique to EOB towards R, elevating trunk off of bed, and scooting to EOB.    Transfers Overall transfer level: Needs assistance Equipment used: 1 person hand held assist Transfers: Sit to/from Stand Sit to Stand: Min assist           General transfer comment: assist for rise, steadying. HHA provided for steadying    Ambulation/Gait Ambulation/Gait assistance: Min assist Gait Distance (Feet): 90 Feet Assistive device: 1 person hand held assist Gait Pattern/deviations: Step-through pattern;Decreased stride length;Trunk flexed Gait velocity: decr     General Gait Details: assist to steady, pt with increasing pain and min dizziness during gait but BP stable  Stairs            Wheelchair Mobility    Modified Rankin (Stroke Patients Only)       Balance Overall balance assessment: Needs assistance Sitting-balance support: Bilateral upper extremity supported;Feet supported Sitting balance-Leahy Scale: Fair     Standing balance support: Bilateral upper extremity supported;During functional activity Standing balance-Leahy Scale: Poor                               Pertinent Vitals/Pain Pain Assessment: Faces Faces Pain Scale: Hurts even more Pain Location: neck Pain Descriptors / Indicators: Discomfort;Grimacing;Guarding Pain Intervention(s): Limited activity within patient's tolerance;Monitored during session;Premedicated before session    Home Living Family/patient expects to be discharged to:: Private residence Living Arrangements: Spouse/significant other Available Help at Discharge: Family Type of Home: House Home Access: Stairs to enter Entrance Stairs-Rails: None Entrance Stairs-Number of Steps: 2 step through front ( no rails) 5 steps in back door with rails   Home Layout: One level  Home Equipment: None Additional Comments: x2 dogs Shawn Stall / Mel Almond  -big dogs) boyfriend stays 2-3 nights a week but works during the day.    Prior Function Prior Level of Function : Independent/Modified Independent             Mobility Comments: furniture walks per patient       Hand Dominance   Dominant Hand: Right    Extremity/Trunk Assessment   Upper Extremity Assessment Upper Extremity Assessment: Defer to OT evaluation    Lower Extremity Assessment Lower Extremity Assessment: Generalized weakness    Cervical / Trunk Assessment Cervical / Trunk Assessment: Neck Surgery  Communication   Communication: No difficulties  Cognition Arousal/Alertness: Lethargic Behavior During Therapy: Restless Overall Cognitive Status: Impaired/Different from baseline Area of Impairment: Awareness;Safety/judgement                         Safety/Judgement: Decreased awareness of safety;Decreased awareness of deficits Awareness: Emergent   General Comments: periods of eye closing during session, reporting high levels of pain even when appears drowsy        General Comments      Exercises     Assessment/Plan    PT Assessment Patient needs continued PT services  PT Problem List Decreased strength;Decreased mobility;Decreased activity tolerance;Decreased balance;Decreased knowledge of use of DME;Pain;Decreased safety awareness;Decreased knowledge of precautions       PT Treatment Interventions DME instruction;Therapeutic activities;Gait training;Therapeutic exercise;Patient/family education;Balance training;Stair training;Functional mobility training;Neuromuscular re-education    PT Goals (Current goals can be found in the Care Plan section)  Acute Rehab PT Goals Patient Stated Goal: home PT Goal Formulation: With patient Time For Goal Achievement: 05/22/21 Potential to Achieve Goals: Good    Frequency Min 5X/week   Barriers to discharge        Co-evaluation               AM-PAC PT "6 Clicks" Mobility  Outcome  Measure Help needed turning from your back to your side while in a flat bed without using bedrails?: A Little Help needed moving from lying on your back to sitting on the side of a flat bed without using bedrails?: A Lot Help needed moving to and from a bed to a chair (including a wheelchair)?: A Lot Help needed standing up from a chair using your arms (e.g., wheelchair or bedside chair)?: A Little Help needed to walk in hospital room?: A Little Help needed climbing 3-5 steps with a railing? : A Little 6 Click Score: 16    End of Session Equipment Utilized During Treatment: Cervical collar Activity Tolerance: Patient tolerated treatment well Patient left: in bed;with call bell/phone within reach;with bed alarm set Nurse Communication: Mobility status PT Visit Diagnosis: Other abnormalities of gait and mobility (R26.89);Muscle weakness (generalized) (M62.81)    Time: 6759-1638 PT Time Calculation (min) (ACUTE ONLY): 26 min   Charges:   PT Evaluation $PT Eval Low Complexity: 1 Low PT Treatments $Gait Training: 8-22 mins        Stacie Glaze, PT DPT Acute Rehabilitation Services Pager [phone]  Office [phone]   [place] 05/15/2021, 3:41 PM

## 2021-05-15 NOTE — Progress Notes (Signed)
Orthopedic Tech Progress Note Patient Details:  Sara Munoz Nov 28, 1965 [id number]  Ortho Devices Type of Ortho Device: Soft collar Ortho Device/Splint Location: NECK Ortho Device/Splint Interventions: Ordered   Post Interventions Patient Tolerated: Well Instructions Provided: Care of [place] 05/15/2021, 2:17 PM

## 2021-05-15 NOTE — Progress Notes (Signed)
Neurosurgery Service Progress Note  Subjective: No acute events overnight, hand numbness stable from preop, no new weakness, significant neck / incisional pain, no new radicular pain, BPs soft this morning   Objective: Vitals:   05/15/21 0400 05/15/21 0415 05/15/21 0711 05/15/21 0804  BP:   (!) 111/58   Pulse:   73   Resp:   15   Temp:   97.6 F (36.4 C)   TempSrc:   Oral   SpO2: (!) 88% 92% 90% 91%  Weight:      Height:        Physical Exam: Strength 5/5 x4, SILTx4 except bilateral stocking distribution hand numbness Incision c/d/i  Assessment & Plan: 55 y.o. woman s/p C5-C7 lami, C5-T1 PSIF, recovering well.  -500cc bolus for borderline hypotension, will send labs -PT/OT when able, limited by pain and BPs -unable to increase pain Rx 2/2 BP, good UOP -SCDs/TEDs, SQH POD2  Judith Part  05/15/21 10:07 AM

## 2021-05-16 ENCOUNTER — Encounter (HOSPITAL_COMMUNITY): Payer: Self-pay | Admitting: Neurological Surgery

## 2021-05-16 ENCOUNTER — Other Ambulatory Visit: Payer: Self-pay

## 2021-05-16 LAB — RENAL FUNCTION PANEL
Albumin: 3 g/dL — ABNORMAL LOW (ref 3.5–5.0)
Anion gap: 6 (ref 5–15)
BUN: 14 mg/dL (ref 6–20)
CO2: 26 mmol/L (ref 22–32)
Calcium: 8.1 mg/dL — ABNORMAL LOW (ref 8.9–10.3)
Chloride: 103 mmol/L (ref 98–111)
Creatinine, Ser: 0.82 mg/dL (ref 0.44–1.00)
GFR, Estimated: >60 mL/min (ref >60)
Glucose, Bld: 95 mg/dL (ref 70–99)
Phosphorus: 2.8 mg/dL (ref 2.5–4.6)
Potassium: 4.3 mmol/L (ref 3.5–5.1)
Sodium: 135 mmol/L (ref 135–145)

## 2021-05-16 NOTE — Progress Notes (Signed)
Physical Therapy Treatment Patient Details Name: Sara Munoz MRN: [id number] DOB: [birth year] Today's Date: 05/16/2021   History of Present Illness 55 yo female s/p  C5-6, C6-7 laminectomies with bilateral foraminotomies; C5-6, C6-7, C7-T1 posterior instrumented spinal fusion 11/22. PMH includes anxiety, GERD, COPD, CAD with history of MI and cath, hep C, HTN, OA, CVA.    PT Comments    Initially at start of session, pt stating she wants to d/c home today. Once mobility initiated, pt complaining of severe pain in neck and requires up to mod assist for bed mobility. Pt stood x1, once standing pt immediately requesting return to supine given severe neck pain. PT attempted to encourage pt to try again, pt reports in too much pain and "needs a pain shot". RN notified of pt pain, will continue to follow.     Recommendations for follow up therapy are one component of a multi-disciplinary discharge planning process, led by the attending physician.  Recommendations may be updated based on patient status, additional functional criteria and insurance authorization.  Follow Up Recommendations  Home health PT     Assistance Recommended at Discharge Frequent or constant Supervision/Assistance  Equipment Recommendations  Rolling walker (2 wheels)    Recommendations for Other Services       Precautions / Restrictions Precautions Precautions: Cervical Precaution Comments: reviewed cervical precautions Required Braces or Orthoses: Cervical Brace Cervical Brace: Soft collar;Other (comment) (ordered after OT)     Mobility  Bed Mobility Overal bed mobility: Needs Assistance Bed Mobility: Rolling;Sidelying to Sit;Sit to Sidelying Rolling: Min assist Sidelying to sit: Mod assist     Sit to sidelying: Mod assist General bed mobility comments: min-mod assist for roll, trunk elevation/lowering. Very increased time, use of bedrails.    Transfers Overall transfer level: Needs  assistance Equipment used: Rolling walker (2 wheels) Transfers: Sit to/from Stand Sit to Stand: Min assist           General transfer comment: min assist for initial power up and steadying, after standing pt immediately requiring return to sitting due to severe pain.    Ambulation/Gait                   Stairs             Wheelchair Mobility    Modified Rankin (Stroke Patients Only)       Balance Overall balance assessment: Needs assistance Sitting-balance support: Bilateral upper extremity supported;Feet supported Sitting balance-Leahy Scale: Fair     Standing balance support: Bilateral upper extremity supported;During functional activity Standing balance-Leahy Scale: Poor                              Cognition Arousal/Alertness: Awake/alert Behavior During Therapy: Restless Overall Cognitive Status: Impaired/Different from baseline Area of Impairment: Awareness;Safety/judgement                         Safety/Judgement: Decreased awareness of safety;Decreased awareness of deficits Awareness: Emergent   General Comments: pt states she wants to go home today, yet not willing to mobilize OOB stating "I just can't today, it will be better when I get home, I just need a pain shot"        Exercises      General Comments        Pertinent Vitals/Pain Pain Assessment: Faces Faces Pain Scale: Hurts worst Pain Location: neck Pain Descriptors / Indicators: Discomfort;Grimacing;Guarding;Spasm Pain Intervention(s):  Limited activity within patient's tolerance;Monitored during session;Patient requesting pain meds-RN notified;Repositioned    Home Living                          Prior Function            PT Goals (current goals can now be found in the care plan section) Acute Rehab PT Goals Patient Stated Goal: home PT Goal Formulation: With patient Time For Goal Achievement: 05/22/21 Potential to Achieve Goals:  Good Progress towards PT goals: Progressing toward goals    Frequency    Min 5X/week      PT Plan Current plan remains appropriate    Co-evaluation              AM-PAC PT "6 Clicks" Mobility   Outcome Measure  Help needed turning from your back to your side while in a flat bed without using bedrails?: A Little Help needed moving from lying on your back to sitting on the side of a flat bed without using bedrails?: A Lot Help needed moving to and from a bed to a chair (including a wheelchair)?: A Lot Help needed standing up from a chair using your arms (e.g., wheelchair or bedside chair)?: A Little Help needed to walk in hospital room?: A Little Help needed climbing 3-5 steps with a railing? : A Little 6 Click Score: 16    End of Session Equipment Utilized During Treatment: Cervical collar Activity Tolerance: Patient limited by pain Patient left: in bed;with call bell/phone within reach;with bed alarm set Nurse Communication: Mobility status PT Visit Diagnosis: Other abnormalities of gait and mobility (R26.89);Muscle weakness (generalized) (M62.81)     Time: 0626-9485 PT Time Calculation (min) (ACUTE ONLY): 20 min  Charges:  $Therapeutic Activity: 8-22 mins                     Stacie Glaze, PT DPT Acute Rehabilitation Services Pager [phone]  Office [phone]   [place] E Ruffin Pyo 05/16/2021, 10:59 AM

## 2021-05-16 NOTE — Patient Outreach (Signed)
Care Coordination  05/16/2021  Sara Munoz 04/15/1966 [id number]  Sara Munoz is currently admitted as an inpatient at [facility] team will follow the progress of Sara Munoz and follow up upon discharge.   [name] RN, BSN [place]  [facility] - Managed Medicaid High Risk [id number].

## 2021-05-16 NOTE — Progress Notes (Addendum)
Neurosurgery Service Progress Note  Subjective: No acute events overnight, significant neck / incisional pain, BP issues resolved, hand numbness improved from preop  Objective: Vitals:   05/15/21 2333 05/16/21 0328 05/16/21 0730 05/16/21 0735  BP: 115/66 (!) 141/77 (!) 149/96   Pulse: 77 76 77   Resp: 17 18 18    Temp: 98.6 F (37 C) 98.5 F (36.9 C) 98.5 F (36.9 C)   TempSrc: Oral Oral Oral   SpO2: 94% 93% 91% 95%  Weight:      Height:        Physical Exam: Strength 5/5 x4, SILTx4 except bilateral stocking distribution hand numbness Incision c/d/i  Assessment & Plan: 55 y.o. woman s/p C5-C7 lami, C5-T1 PSIF, recovering well.  -Cr bump / AKI resolved w/ fluids, can d/c IVF, BP WNL -SCDs/TEDs, SQH -will see if she feels up to going home today once she's up and around, can discharge home today or tomorrow, will need home health PT/OT  Sara Munoz  05/16/21 9:01 AM

## 2021-05-16 NOTE — Progress Notes (Signed)
Occupational Therapy Treatment Patient Details Name: Sara Munoz MRN: [id number] DOB: 08/27/1965 Today's Date: 05/16/2021   History of present illness 55 yo female s/p  C5-6, C6-7 laminectomies with bilateral foraminotomies; C5-6, C6-7, C7-T1 posterior instrumented spinal fusion 11/22. PMH includes anxiety, GERD, COPD, CAD with history of MI and cath, hep C, HTN, OA, CVA.   OT comments  Pt progress to eob sitting but unable to sustain. Pt with incontinence of bowel and lack of awareness. Pt perseverating on pain. Pt needs max encouragement to engage for oob level task. Pt noted to have drainage clear from most inferior point of incision this session. Drainage noted on pillow with redness. Recommendation for HHOT needed and increased activity tolerance or SNF recommendation will be added.    Recommendations for follow up therapy are one component of a multi-disciplinary discharge planning process, led by the attending physician.  Recommendations may be updated based on patient status, additional functional criteria and insurance authorization.    Follow Up Recommendations  Home health OT    Assistance Recommended at Discharge Set up Supervision/Assistance  Equipment Recommendations  BSC/3in1;Other (comment) (RW)    Recommendations for Other Services      Precautions / Restrictions Precautions Precautions: Cervical Precaution Comments: reviewed cervical precautions Required Braces or Orthoses: Cervical Brace Cervical Brace: Soft collar;Other (comment)       Mobility Bed Mobility Overal bed mobility: Needs Assistance Bed Mobility: Rolling;Supine to Sit;Sit to Supine Rolling: Min assist   Supine to sit: Mod assist Sit to supine: Max assist   General bed mobility comments: pt requires (A) to elevate trunk from surface. pt requires mod cues for neck precautions    Transfers Overall transfer level: Needs assistance Equipment used: Rolling walker (2 wheels) Transfers: Sit  to/from Stand Sit to Stand: Mod assist           General transfer comment: elevated surface required and side stepping to [facility]     Balance Overall balance assessment: Needs assistance Sitting-balance support: Bilateral upper extremity supported;Feet supported Sitting balance-Leahy Scale: Poor     Standing balance support: Bilateral upper extremity supported;During functional activity Standing balance-Leahy Scale: Poor                             ADL either performed or assessed with clinical judgement   ADL Overall ADL's : Needs assistance/impaired                             Toileting- Clothing Manipulation and Hygiene: Total assistance Toileting - Clothing Manipulation Details (indicate cue type and reason): incontinence of bowel and lack of awareness       General ADL Comments: pt reports increased pain and needed max encouragement to progress to eob. pt reports severe pain wiht any hob elevation    Extremity/Trunk Assessment              Vision       Perception     Praxis      Cognition Arousal/Alertness: Awake/alert Behavior During Therapy: Restless Overall Cognitive Status: Impaired/Different from baseline Area of Impairment: Awareness;Safety/judgement                         Safety/Judgement: Decreased awareness of safety;Decreased awareness of deficits Awareness: Emergent              Exercises     Shoulder Instructions  General Comments incision with drainage from the most inferior angle    Pertinent Vitals/ Pain       Pain Assessment: Faces Faces Pain Scale: Hurts worst Pain Location: neck Pain Descriptors / Indicators: Discomfort;Grimacing;Guarding;Spasm Pain Intervention(s): Limited activity within patient's tolerance;Monitored during session;Premedicated before session;Repositioned  Home Living                                          Prior Functioning/Environment               Frequency  Min 2X/week        Progress Toward Goals  OT Goals(current goals can now be found in the care plan section)  Progress towards OT goals: Progressing toward goals  Acute Rehab OT Goals Patient Stated Goal: reports wanting to go home 11/24 OT Goal Formulation: With patient Time For Goal Achievement: 05/29/21 Potential to Achieve Goals: Good ADL Goals Pt Will Perform Upper Body Dressing: with modified independence Pt Will Perform Lower Body Dressing: with modified independence;sit to/from stand Pt Will Transfer to Toilet: with modified independence;ambulating;bedside commode Additional ADL Goal #1: pt will complete bed mobility mod I as precursor to adls  Plan Discharge plan remains appropriate    Co-evaluation                 AM-PAC OT "6 Clicks" Daily Activity     Outcome Measure   Help from another person eating meals?: A Little Help from another person taking care of personal grooming?: A Little Help from another person toileting, which includes using toliet, bedpan, or urinal?: A Little Help from another person bathing (including washing, rinsing, drying)?: A Little Help from another person to put on and taking off regular upper body clothing?: A Little Help from another person to put on and taking off regular lower body clothing?: A Little 6 Click Score: 18    End of Session Equipment Utilized During Treatment: Cervical collar  OT Visit Diagnosis: Unsteadiness on feet (R26.81);Muscle weakness (generalized) (M62.81);Pain   Activity Tolerance Patient limited by pain   Patient Left with call bell/phone within reach;in bed;with bed alarm set   Nurse Communication Mobility status;Precautions        Time: 8309-4076 OT Time Calculation (min): 19 min  Charges: OT General Charges $OT Visit: 1 Visit OT Treatments $Self Care/Home Management : 8-22 mins   Brynn, OTR/L  Acute Rehabilitation Services Pager: [phone] Office:  [phone] .   Jeri Modena 05/16/2021, 4:52 PM

## 2021-05-17 NOTE — Progress Notes (Signed)
Patient with significant posterior cervical incisional pain today limiting mobility.  Upper extremity pain and numbness persist but remain improved from preop.  Lower extremities feel good.  Afebrile.  Vital signs are stable.  Motor and sensory function stable.  Wound clean and dry.  Progressing slowly following posterior cervical decompression and fusion surgery.  Continue efforts at mobilization.

## 2021-05-17 NOTE — Progress Notes (Signed)
Physical Therapy Treatment Patient Details Name: Sara Munoz MRN: [id number] DOB: Jan 20, 1966 Today's Date: 05/17/2021   History of Present Illness 55 yo female admitted 11/21 s/p  C5-6, C6-7 laminectomies with bilateral foraminotomies; C5-6, C6-7, C7-T1 posterior instrumented spinal fusion. PMH includes anxiety, GERD, COPD, CAD with history of MI and cath, hep C, HTN, OA, CVA.    PT Comments    Pt premedicated with oxy and robaxin with report of pain 6-7/10 at rest and 8-9 with mobility. Pt able to get to EOB sitting only and could not progress beyond this due to pain despite assist and reassurance. Rolling and bed mobility with pt placed in chair position at 40 degrees and unable to tolerate increased HOB for sitting even in supported sitting. Pt educated for need for progression for home and RN present. Encouraged further attempts EOB sitting today with nursing staff. If pt unable to progress ST-SNF may need to be considered.     Recommendations for follow up therapy are one component of a multi-disciplinary discharge planning process, led by the attending physician.  Recommendations may be updated based on patient status, additional functional criteria and insurance authorization.  Follow Up Recommendations  Home health PT     Assistance Recommended at Discharge Frequent or constant Supervision/Assistance  Equipment Recommendations  Rolling walker (2 wheels)    Recommendations for Other Services       Precautions / Restrictions Precautions Precautions: Cervical;Fall Precaution Comments: reviewed cervical precautions Required Braces or Orthoses: Cervical Brace Cervical Brace: Soft collar     Mobility  Bed Mobility Overal bed mobility: Needs Assistance Bed Mobility: Rolling;Sidelying to Sit Rolling: Min assist Sidelying to sit: Mod assist       General bed mobility comments: pt roll bil min assist x 2 with assist for pericare. Mod assist to rise to sitting and only able  to maintain grossly 30 sec as pt reporting intense pain and unable to tolerate with assist to return to sidelying and supine    Transfers                   General transfer comment: unable    Ambulation/Gait                   Stairs             Wheelchair Mobility    Modified Rankin (Stroke Patients Only)       Balance Overall balance assessment: Needs assistance Sitting-balance support: Bilateral upper extremity supported;No upper extremity supported Sitting balance-Leahy Scale: Poor Sitting balance - Comments: min assist sitting EOB                                    Cognition Arousal/Alertness: Awake/alert Behavior During Therapy: WFL for tasks assessed/performed Overall Cognitive Status: Within Functional Limits for tasks assessed                                          Exercises      General Comments        Pertinent Vitals/Pain Pain Score: 8  Pain Location: neck and shoulders with movement Pain Descriptors / Indicators: Discomfort;Grimacing;Guarding;Aching;Constant Pain Intervention(s): Limited activity within patient's tolerance;Monitored during session;Premedicated before session;Repositioned    Home Living  Prior Function            PT Goals (current goals can now be found in the care plan section) Progress towards PT goals: Not progressing toward goals - comment (limited by pain)    Frequency    Min 5X/week      PT Plan Current plan remains appropriate    Co-evaluation              AM-PAC PT "6 Clicks" Mobility   Outcome Measure  Help needed turning from your back to your side while in a flat bed without using bedrails?: A Little Help needed moving from lying on your back to sitting on the side of a flat bed without using bedrails?: A Lot Help needed moving to and from a bed to a chair (including a wheelchair)?: Total Help needed standing  up from a chair using your arms (e.g., wheelchair or bedside chair)?: Total Help needed to walk in hospital room?: Total Help needed climbing 3-5 steps with a railing? : Total 6 Click Score: 9    End of Session Equipment Utilized During Treatment: Cervical collar Activity Tolerance: Patient limited by pain Patient left: in bed;with call bell/phone within reach;with nursing/sitter in room Nurse Communication: Mobility status PT Visit Diagnosis: Other abnormalities of gait and mobility (R26.89);Muscle weakness (generalized) (M62.81)     Time: 8144-8185 PT Time Calculation (min) (ACUTE ONLY): 21 min  Charges:  $Therapeutic Activity: 8-22 mins                     [name] P, PT Acute Rehabilitation Services Pager: [phone] Office: [place] [name] 05/17/2021, 8:33 AM

## 2021-05-18 ENCOUNTER — Ambulatory Visit: Payer: Medicaid Other

## 2021-05-18 NOTE — Progress Notes (Signed)
Occupational Therapy Treatment Patient Details Name: Sara Munoz MRN: [id number] DOB: 02/16/1966 Today's Date: 05/18/2021   History of present illness 55 yo female admitted 11/21 s/p  C5-6, C6-7 laminectomies with bilateral foraminotomies; C5-6, C6-7, C7-T1 posterior instrumented spinal fusion. PMH includes anxiety, GERD, COPD, CAD with history of MI and cath, hep C, HTN, OA, CVA.   OT comments  Pt continues to require return to reclined positioning due to severe neck pain. Pt will likely need increase level of care at SNF. Pt will have prn (A) at home only and unable to transfer from bed surface at this time wiithout (A). Pt motivated to get up to chair this session. Recommendation HHOT and agreeable to SNF [name].   Recommendations for follow up therapy are one component of a multi-disciplinary discharge planning process, led by the attending physician.  Recommendations may be updated based on patient status, additional functional criteria and insurance authorization.    Follow Up Recommendations  Home health OT    Assistance Recommended at Discharge Set up Supervision/Assistance  Equipment Recommendations  BSC/3in1;Other (comment)    Recommendations for Other Services      Precautions / Restrictions Precautions Precautions: Cervical;Fall Precaution Comments: reviewed cervical precautions Required Braces or Orthoses: Cervical Brace Cervical Brace: Soft collar Restrictions Weight Bearing Restrictions: No       Mobility Bed Mobility Overal bed mobility: Needs Assistance Bed Mobility: Rolling;Sidelying to Sit Rolling: Min guard Sidelying to sit: Mod assist       General bed mobility comments: min guard to roll towards L side. Patient wanting to perform bed mobility without assistance but unable to push trunk into sitting requiring modA. Patient with hip extension with attempt for assist requiring blocking at knees to prevent sliding off bed    Transfers Overall  transfer level: Needs assistance Equipment used: Rolling walker (2 wheels) Transfers: Sit to/from Stand Sit to Stand: Min assist;+2 physical assistance;+2 safety/equipment           General transfer comment: minA+2 to stand and steady     Balance Overall balance assessment: Needs assistance Sitting-balance support: Bilateral upper extremity supported;No upper extremity supported Sitting balance-Leahy Scale: Fair     Standing balance support: Bilateral upper extremity supported;During functional activity Standing balance-Leahy Scale: Poor                             ADL either performed or assessed with clinical judgement   ADL Overall ADL's : Needs assistance/impaired Eating/Feeding: Set up;Sitting   Grooming: Min guard;Sitting                   Toilet Transfer: Minimal assistance;Ambulation;Rolling walker (2 wheels);+2 for physical assistance;+2 for safety/equipment           Functional mobility during ADLs: +2 for physical assistance;Rolling walker (2 wheels)      Extremity/Trunk Assessment Upper Extremity Assessment Upper Extremity Assessment: Overall WFL for tasks assessed   Lower Extremity Assessment Lower Extremity Assessment: Generalized weakness        Vision       Perception     Praxis      Cognition Arousal/Alertness: Awake/alert Behavior During Therapy: WFL for tasks assessed/performed Overall Cognitive Status: Within Functional Limits for tasks assessed                                 General Comments: improved awareness  with recognizing need for rehab prior to returning home          Exercises Exercises: General Lower Extremity General Exercises - Lower Extremity Ankle Circles/Pumps: Both;10 reps;Other (comment) (long sitting) Long Arc Quad: Both;5 reps;Seated Straight Leg Raises: Both;5 reps;Other (comment) (long sitting)   Shoulder Instructions       General Comments      Pertinent Vitals/  Pain       Pain Assessment: 0-10 Pain Score: 9  Pain Location: neck Pain Descriptors / Indicators: Discomfort;Grimacing;Guarding;Aching;Constant Pain Intervention(s): Limited activity within patient's tolerance  Home Living                                          Prior Functioning/Environment              Frequency  Min 2X/week        Progress Toward Goals  OT Goals(current goals can now be found in the care plan section)  Progress towards OT goals: Progressing toward goals  Acute Rehab OT Goals Patient Stated Goal: agreeable to SNF in [place] area OT Goal Formulation: With patient Time For Goal Achievement: 05/29/21 Potential to Achieve Goals: Good ADL Goals Pt Will Perform Upper Body Dressing: with modified independence Pt Will Perform Lower Body Dressing: with modified independence;sit to/from stand Pt Will Transfer to Toilet: with modified independence;ambulating;bedside commode Additional ADL Goal #1: pt will complete bed mobility mod I as precursor to adls  Plan Discharge plan remains appropriate    Co-evaluation                 AM-PAC OT "6 Clicks" Daily Activity     Outcome Measure   Help from another person eating meals?: A Little Help from another person taking care of personal grooming?: A Little Help from another person toileting, which includes using toliet, bedpan, or urinal?: A Little Help from another person bathing (including washing, rinsing, drying)?: A Little Help from another person to put on and taking off regular upper body clothing?: A Little Help from another person to put on and taking off regular lower body clothing?: A Little 6 Click Score: 18    End of Session Equipment Utilized During Treatment: Cervical collar  OT Visit Diagnosis: Unsteadiness on feet (R26.81);Muscle weakness (generalized) (M62.81);Pain   Activity Tolerance Patient limited by pain   Patient Left with call bell/phone within reach;in  bed;with bed alarm set   Nurse Communication Mobility status;Precautions        Time: 2409-7353 OT Time Calculation (min): 20 min  Charges: OT General Charges $OT Visit: 1 Visit OT Treatments $Self Care/Home Management : 8-22 mins   Brynn, OTR/L  Acute Rehabilitation Services Pager: [phone] Office: [phone] .   Jeri Modena 05/18/2021, 2:30 PM

## 2021-05-18 NOTE — Progress Notes (Signed)
Patient doing little better today.  Pain better controlled.  Denies any radiating pain.  Still with some numbness in both upper extremities.  Mobility still poor.  Patient not getting out of bed without maximal assistance.    Awake and alert.  Oriented and appropriate.  Wound clean and dry.  Chest and abdomen benign.  Neurologically stable with some continued bilateral upper extremity distal numbness and paresthesias.    Status post revision cervical thoracic decompression and fusion surgery.  Continue efforts immobilization and pain control.

## 2021-05-18 NOTE — Progress Notes (Signed)
Physical Therapy Treatment Patient Details Name: Sara Munoz MRN: [id number] DOB: 1965/09/30 Today's Date: 05/18/2021   History of Present Illness 55 yo female admitted 11/21 s/p  C5-6, C6-7 laminectomies with bilateral foraminotomies; C5-6, C6-7, C7-T1 posterior instrumented spinal fusion. PMH includes anxiety, GERD, COPD, CAD with history of MI and cath, hep C, HTN, OA, CVA.    PT Comments    Patient able to progress OOB to chair this session. Patient ambulated 6' with RW and minA + close chair follow. Patient with increasing pain with mobility but improving from previous sessions. Patient required reinforcement of cervical precautions. Patient was only able to recall 1/3 precautions. Updated recommendation to SNF at discharge to maximize functional mobility prior to returning home.     Recommendations for follow up therapy are one component of a multi-disciplinary discharge planning process, led by the attending physician.  Recommendations may be updated based on patient status, additional functional criteria and insurance authorization.  Follow Up Recommendations  Skilled nursing-short term rehab (<3 hours/day)     Assistance Recommended at Discharge Frequent or constant Supervision/Assistance  Equipment Recommendations  Rolling Dianne Whelchel (2 wheels)    Recommendations for Other Services       Precautions / Restrictions Precautions Precautions: Cervical;Fall Precaution Comments: reviewed cervical precautions Required Braces or Orthoses: Cervical Brace Cervical Brace: Soft collar Restrictions Weight Bearing Restrictions: No     Mobility  Bed Mobility Overal bed mobility: Needs Assistance Bed Mobility: Rolling;Sidelying to Sit Rolling: Min guard Sidelying to sit: Mod assist       General bed mobility comments: min guard to roll towards L side. Patient wanting to perform bed mobility without assistance but unable to push trunk into sitting requiring modA. Patient with hip  extension with attempt for assist requiring blocking at knees to prevent sliding off bed    Transfers Overall transfer level: Needs assistance Equipment used: Rolling Rafel Garde (2 wheels) Transfers: Sit to/from Stand Sit to Stand: Min assist;+2 physical assistance;+2 safety/equipment           General transfer comment: minA+2 to stand and steady    Ambulation/Gait Ambulation/Gait assistance: Min assist;+2 safety/equipment Gait Distance (Feet): 6 Feet Assistive device: Rolling Railee Bonillas (2 wheels) Gait Pattern/deviations: Step-to pattern;Decreased stride length;Wide base of support Gait velocity: decreased     General Gait Details: minA for steadying and close chair follow. Patient with increasing pain but able to ambulate 2 more feet with encouragement.   Stairs             Wheelchair Mobility    Modified Rankin (Stroke Patients Only)       Balance Overall balance assessment: Needs assistance Sitting-balance support: Bilateral upper extremity supported;No upper extremity supported Sitting balance-Leahy Scale: Fair     Standing balance support: Bilateral upper extremity supported;During functional activity Standing balance-Leahy Scale: Poor                              Cognition Arousal/Alertness: Awake/alert Behavior During Therapy: WFL for tasks assessed/performed Overall Cognitive Status: Within Functional Limits for tasks assessed                                 General Comments: improved awareness with recognizing need for rehab prior to returning home        Exercises General Exercises - Lower Extremity Ankle Circles/Pumps: Both;10 reps;Other (comment) (long sitting) Long Arc Quad: Both;5  reps;Seated Straight Leg Raises: Both;5 reps;Other (comment) (long sitting)    General Comments        Pertinent Vitals/Pain Pain Assessment: 0-10 Pain Score: 9  Pain Location: neck Pain Descriptors / Indicators:  Discomfort;Grimacing;Guarding;Aching;Constant Pain Intervention(s): Monitored during session;Repositioned;Patient requesting pain meds-RN notified    Home Living                          Prior Function            PT Goals (current goals can now be found in the care plan section) Acute Rehab PT Goals Patient Stated Goal: home PT Goal Formulation: With patient Time For Goal Achievement: 05/22/21 Potential to Achieve Goals: Good Progress towards PT goals: Progressing toward goals    Frequency    Min 5X/week      PT Plan Discharge plan needs to be updated    Co-evaluation              AM-PAC PT "6 Clicks" Mobility   Outcome Measure  Help needed turning from your back to your side while in a flat bed without using bedrails?: A Little Help needed moving from lying on your back to sitting on the side of a flat bed without using bedrails?: A Little Help needed moving to and from a bed to a chair (including a wheelchair)?: Total Help needed standing up from a chair using your arms (e.g., wheelchair or bedside chair)?: Total Help needed to walk in hospital room?: Total Help needed climbing 3-5 steps with a railing? : Total 6 Click Score: 10    End of Session Equipment Utilized During Treatment: Cervical collar Activity Tolerance: Patient limited by pain Patient left: in chair;with call bell/phone within reach Nurse Communication: Mobility status PT Visit Diagnosis: Other abnormalities of gait and mobility (R26.89);Muscle weakness (generalized) (M62.81)     Time: 5188-4166 PT Time Calculation (min) (ACUTE ONLY): 23 min  Charges:  $Gait Training: 8-22 mins                     Donney Caraveo A. Gilford Rile PT, DPT Acute Rehabilitation Services Pager [phone] Office [phone]    Linna Hoff 05/18/2021, 2:18 PM

## 2021-05-19 MED ORDER — METHOCARBAMOL 500 MG PO TABS: 500.0000 mg | ORAL_TABLET | Freq: Four times a day (QID) | ORAL | 0 refills | Status: DC

## 2021-05-19 NOTE — Progress Notes (Signed)
Occupational Therapy Treatment Patient Details Name: Sara Munoz MRN: [id number] DOB: 25-Oct-1965 Today's Date: 05/19/2021   History of present illness 55 yo female admitted 11/21 s/p  C5-6, C6-7 laminectomies with bilateral foraminotomies; C5-6, C6-7, C7-T1 posterior instrumented spinal fusion. PMH includes anxiety, GERD, COPD, CAD with history of MI and cath, hep C, HTN, OA, CVA.   OT comments  Pt demonstrates significant improvement today.  She is able to perform ADLs with supervision - min A level.  She is only able to verbally recall 1/3 cervical precautions, but maintains them fairly well, with min cues, during functional activities.  She now reports that her boyfriend is going to take a month off of work, and will be able to provide assist as needed at discharge.   She is eager to discharge home.  Recommend HHOT, tub transfer bench, and 3in1/BSC.     Recommendations for follow up therapy are one component of a multi-disciplinary discharge planning process, led by the attending physician.  Recommendations may be updated based on patient status, additional functional criteria and insurance authorization.    Follow Up Recommendations  Home health OT    Assistance Recommended at Discharge Intermittent Supervision/Assistance  Equipment Recommendations  BSC/3in1;Tub/shower bench    Recommendations for Other Services      Precautions / Restrictions Precautions Precautions: Cervical;Fall Precaution Comments: Pt able to recall 1/3 cervical precautions when asked, but followed them well with only min cues during functional tasks Required Braces or Orthoses: Cervical Brace Cervical Brace: Soft collar Restrictions Weight Bearing Restrictions: No       Mobility Bed Mobility               General bed mobility comments: Pt sitting up in recliner    Transfers                         Balance Overall balance assessment: Needs assistance Sitting-balance support: Feet  supported Sitting balance-Leahy Scale: Good     Standing balance support: Single extremity supported;During functional activity Standing balance-Leahy Scale: Poor                             ADL either performed or assessed with clinical judgement   ADL Overall ADL's : Needs assistance/impaired Eating/Feeding: Modified independent   Grooming: Wash/dry hands;Wash/dry face;Oral care;Brushing hair;Minimal assistance;Standing Grooming Details (indicate cue type and reason): Pt has dentures.  She was instructed in safety with oral care and to avoid bending forward when spitting.  She requires assist for her hair due to fatigue Upper Body Bathing: Supervision/ safety;Set up;Sitting   Lower Body Bathing: Min guard;Sit to/from stand   Upper Body Dressing : Set up;Supervision/safety;Sitting   Lower Body Dressing: Minimal assistance;Sit to/from stand Lower Body Dressing Details (indicate cue type and reason): assist to pull socks over toes.  She demonstrates ability to to perform figure 4 to don pants Toilet Transfer: Min guard;Ambulation;Comfort height toilet;Rolling walker (2 wheels)   Toileting- Clothing Manipulation and Hygiene: Min guard;Sit to/from stand     Tub/Shower Transfer Details (indicate cue type and reason): Pt instructed in use of tub transfer bench and was shown a video of its use.  She was able to verbalize understanding Functional mobility during ADLs: Min guard;Rolling walker (2 wheels)      Extremity/Trunk Assessment Upper Extremity Assessment Upper Extremity Assessment: Generalized weakness   Lower Extremity Assessment Lower Extremity Assessment: Defer to PT evaluation  Vision       Perception     Praxis      Cognition Arousal/Alertness: Awake/alert Behavior During Therapy: WFL for tasks assessed/performed Overall Cognitive Status: Within Functional Limits for tasks assessed                                 General  Comments: decreased recall of precautions - anticipate this may be her baseline          Exercises     Shoulder Instructions       General Comments Pt now reports that her boyfriend is taking a month off of work, and will be available 24/7 to assist her as needed at discharge    Pertinent Vitals/ Pain       Pain Assessment: Faces Faces Pain Scale: Hurts little more Pain Location: neck Pain Descriptors / Indicators: Discomfort;Grimacing;Guarding;Aching;Constant Pain Intervention(s): Monitored during session  Home Living                                          Prior Functioning/Environment              Frequency  Min 2X/week        Progress Toward Goals  OT Goals(current goals can now be found in the care plan section)  Progress towards OT goals: Progressing toward goals     Plan Discharge plan remains appropriate;Equipment recommendations need to be updated    Co-evaluation                 AM-PAC OT "6 Clicks" Daily Activity     Outcome Measure   Help from another person eating meals?: None Help from another person taking care of personal grooming?: A Little Help from another person toileting, which includes using toliet, bedpan, or urinal?: A Little Help from another person bathing (including washing, rinsing, drying)?: A Little Help from another person to put on and taking off regular upper body clothing?: A Little Help from another person to put on and taking off regular lower body clothing?: A Little 6 Click Score: 19    End of Session Equipment Utilized During Treatment: Cervical collar  OT Visit Diagnosis: Unsteadiness on feet (R26.81);Muscle weakness (generalized) (M62.81);Pain Pain - part of body:  (neck)   Activity Tolerance Patient tolerated treatment well   Patient Left in chair;with call bell/phone within reach   Nurse Communication Mobility status        Time: 3335-4562 OT Time Calculation (min): 23  min  Charges: OT General Charges $OT Visit: 1 Visit OT Treatments $Self Care/Home Management : 23-37 mins  Nilsa Nutting OTR/L Acute Rehabilitation Services Pager [phone] Office [phone]   Lucille Passy M 05/19/2021, 11:31 AM

## 2021-05-19 NOTE — Discharge Summary (Signed)
Physician Discharge Summary  Patient ID: Sara Munoz MRN: [id number] DOB/AGE: 08-26-1965 55 y.o.  Admit date: 05/14/2021 Discharge date: 05/19/2021  Admission Diagnoses: cervical pseudoarthrosis, cervical radiculopathy    Discharge Diagnoses: same   Discharged Condition: good  Hospital Course: The patient was admitted on 05/14/2021 and taken to the operating room where the patient underwent laminectomies C5-6, C6-7, C7-T1 with posterior instrumented fusion. The patient tolerated the procedure well and was taken to the recovery room and then to the floor in stable condition. The hospital course was routine. There were no complications. The wound remained clean dry and intact. Pt had appropriate neck soreness. No complaints of arm pain or new N/T/W. The patient remained afebrile with stable vital signs, and tolerated a regular diet. The patient continued to increase activities, and pain was well controlled with oral pain medications.   Consults: None  Significant Diagnostic Studies:  Results for orders placed or performed during the hospital encounter of 05/14/21  SARS Coronavirus 2 by RT PCR (hospital order, performed in [facility] hospital lab) Nasopharyngeal Nasopharyngeal Swab   Specimen: Nasopharyngeal Swab  Result Value Ref Range   SARS Coronavirus 2 NEGATIVE NEGATIVE  CBC  Result Value Ref Range   WBC 13.8 (H) 4.0 - 10.5 K/uL   RBC 3.92 3.87 - 5.11 MIL/uL   Hemoglobin 10.2 (L) 12.0 - 15.0 g/dL   HCT 31.8 (L) 36.0 - 46.0 %   MCV 81.1 80.0 - 100.0 fL   MCH 26.0 26.0 - 34.0 pg   MCHC 32.1 30.0 - 36.0 g/dL   RDW 16.5 (H) 11.5 - 15.5 %   Platelets 218 150 - 400 K/uL   nRBC 0.0 0.0 - 0.2 %  Comprehensive metabolic panel  Result Value Ref Range   Sodium 134 (L) 135 - 145 mmol/L   Potassium 4.4 3.5 - 5.1 mmol/L   Chloride 101 98 - 111 mmol/L   CO2 24 22 - 32 mmol/L   Glucose, Bld 122 (H) 70 - 99 mg/dL   BUN 17 6 - 20 mg/dL   Creatinine, Ser 1.22 (H) 0.44 - 1.00 mg/dL    Calcium 8.2 (L) 8.9 - 10.3 mg/dL   Total Protein 6.1 (L) 6.5 - 8.1 g/dL   Albumin 3.2 (L) 3.5 - 5.0 g/dL   AST 27 15 - 41 U/L   ALT 14 0 - 44 U/L   Alkaline Phosphatase 57 38 - 126 U/L   Total Bilirubin 0.6 0.3 - 1.2 mg/dL   GFR, Estimated 53 (L) >60 mL/min   Anion gap 9 5 - 15  APTT  Result Value Ref Range   aPTT 29 24 - 36 seconds  Protime-INR  Result Value Ref Range   Prothrombin Time 13.5 11.4 - 15.2 seconds   INR 1.0 0.8 - 1.2  Renal function panel  Result Value Ref Range   Sodium 135 135 - 145 mmol/L   Potassium 4.3 3.5 - 5.1 mmol/L   Chloride 103 98 - 111 mmol/L   CO2 26 22 - 32 mmol/L   Glucose, Bld 95 70 - 99 mg/dL   BUN 14 6 - 20 mg/dL   Creatinine, Ser 0.82 0.44 - 1.00 mg/dL   Calcium 8.1 (L) 8.9 - 10.3 mg/dL   Phosphorus 2.8 2.5 - 4.6 mg/dL   Albumin 3.0 (L) 3.5 - 5.0 g/dL   GFR, Estimated >60 >60 mL/min   Anion gap 6 5 - 15    DG Cervical Spine 1 View  Result Date: 05/14/2021 CLINICAL  DATA:  C5-6 laminectomy EXAM: DG CERVICAL SPINE - 1 VIEW COMPARISON:  03/31/2021 FLUOROSCOPY TIME:  Radiation Exposure Index (as provided by the fluoroscopic device): 3.51 mGy If the device does not provide the exposure index: Fluoroscopy Time:  18 seconds Number of Acquired Images:  1 FINDINGS: Image shows surgical retractors with evidence of prior anterior fusion at C5-6 and C6-7. Pedicle screws are noted at C5, C6 and what appears to be T1. IMPRESSION: Changes of laminectomy with posterior fusion as described Electronically Signed   By: Inez Catalina M.D.   On: 05/14/2021 19:45   MR CERVICAL SPINE WO CONTRAST  Result Date: 04/27/2021 CLINICAL DATA:  Cervical spine surgery, neck pain and pain in both arms and legs EXAM: MRI CERVICAL SPINE WITHOUT CONTRAST TECHNIQUE: Multiplanar, multisequence MR imaging of the cervical spine was performed. No intravenous contrast was administered. COMPARISON:  11/24/2018, a CT cervical spine from 03/31/2021 could not be retrieved FINDINGS: Alignment:  Straightening of the normal cervical lordosis. No significant listhesis. Vertebrae: No acute fracture or suspicious osseous lesion. Status post ACDF C5-C7 Cord: Normal signal and morphology. Posterior Fossa, vertebral arteries, paraspinal tissues: Negative. Disc levels: C2-C3: No significant disc bulge. No spinal canal stenosis or neuroforaminal narrowing. C3-C4: No significant disc bulge. Mild facet arthropathy. No spinal canal stenosis or neuroforaminal narrowing. C4-C5: Mild disc bulge. Mild left greater than right facet arthropathy. No spinal canal stenosis. Mild left neural foraminal narrowing, which is new from the prior exam. C5-C6: Status post interval fusion. The posterior aspect of the vertebral body abuts the ventral cord. Mild ligamentum flavum thickening. Mild spinal canal stenosis. Uncovertebral and facet arthropathy. Mild bilateral neural foraminal narrowing, which has progressed on the right. C6-C7: Status post fusion, with the posterior aspect of the vertebral bodies indenting on ventral cord. Mild ligamentum flavum hypertrophy. Moderate spinal canal stenosis. Uncovertebral and facet arthropathy. Moderate bilateral neural foraminal narrowing, which has progressed from the prior exam. C7-T1: No significant disc bulge. No spinal canal stenosis or neuroforaminal narrowing. IMPRESSION: 1. Status post ACDF C5-C7. Osseous hypertrophy at these levels causes moderate spinal canal stenosis and moderate bilateral neural foraminal narrowing at C6-C7 and mild spinal canal stenosis and mild bilateral neural foraminal narrowing at C5-C6, both of which have progressed from the prior MRI. 2. C4-C5 mild left neural foraminal narrowing, which is new from the exam. Electronically Signed   By: Merilyn Baba M.D.   On: 04/27/2021 12:19   DG C-Arm 1-60 Min-No Report  Result Date: 05/14/2021 Fluoroscopy was utilized by the requesting physician.  No radiographic interpretation.   DG C-Arm 1-60 Min-No  Report  Result Date: 05/14/2021 Fluoroscopy was utilized by the requesting physician.  No radiographic interpretation.    Antibiotics:  Anti-infectives (From admission, onward)    Start     Dose/Rate Route Frequency Ordered Stop   05/15/21 0000  ceFAZolin (ANCEF) IVPB 2g/100 mL premix        2 g 200 mL/hr over 30 Minutes Intravenous Every 8 hours 05/14/21 2113 05/16/21 0918   05/14/21 1145  ceFAZolin (ANCEF) IVPB 2g/100 mL premix        2 g 200 mL/hr over 30 Minutes Intravenous On call to O.R. 05/14/21 1141 05/14/21 1554       Discharge Exam: Blood pressure (!) 90/51, pulse 74, temperature 98.9 F (37.2 C), resp. rate 20, height 5' 4"  (1.626 m), weight 90.7 kg, last menstrual period 12/16/2017, SpO2 91 %. Neurologic: Grossly normal Ambulating and voindg well, incision cdi   Discharge Medications:  Allergies as of 05/19/2021       Reactions   Flexeril [cyclobenzaprine] Hives, Swelling   Facial/lip swelling      Levofloxacin Hives, Swelling   Phenergan [promethazine Hcl] Other (See Comments)   Agitation.    Toradol [ketorolac Tromethamine] Swelling, Other (See Comments)   Facial/tongue swelling    Tramadol Hives, Swelling, Other (See Comments)   Lip swelling    Zoloft [sertraline Hcl] Swelling   Tongue swelling           Medication List     STOP taking these medications    methylPREDNISolone 4 MG Tbpk tablet Commonly known as: MEDROL DOSEPAK   oxyCODONE 5 MG immediate release tablet Commonly known as: Roxicodone       TAKE these medications    albuterol (2.5 MG/3ML) 0.083% nebulizer solution Commonly known as: PROVENTIL Take 3 mLs (2.5 mg total) by nebulization every 6 (six) hours as needed for wheezing or shortness of breath.   albuterol 108 (90 Base) MCG/ACT inhaler Commonly known as: ProAir HFA INHALE TWO PUFFS BY MOUTH INTO LUNGS EVERY 6 HOURS AS NEEDED FOR SHORTNESS OF BREATH OR wheezing   amLODipine 5 MG tablet Commonly known as:  NORVASC Take 1 tablet (5 mg total) by mouth daily.   atorvastatin 20 MG tablet Commonly known as: LIPITOR Take 1 tablet (20 mg total) by mouth every evening.   budesonide-formoterol 160-4.5 MCG/ACT inhaler Commonly known as: Symbicort Inhale 2 puffs into the lungs in the morning and at bedtime.   buprenorphine-naloxone 8-2 mg Subl SL tablet Commonly known as: SUBOXONE Place 1 tablet under the tongue in the morning and at bedtime.   docusate sodium 100 MG capsule Commonly known as: COLACE Take 100 mg by mouth 2 (two) times daily.   DULoxetine 30 MG capsule Commonly known as: CYMBALTA Take 30 mg by mouth 2 (two) times daily.   DULoxetine 60 MG capsule Commonly known as: CYMBALTA Take 1 capsule (60 mg total) by mouth daily. Please call [phone] to schedule appt or may request future refills from PCP.   gabapentin 600 MG tablet Commonly known as: NEURONTIN Take 600 mg by mouth 4 (four) times daily.   hydrOXYzine 50 MG tablet Commonly known as: ATARAX/VISTARIL Take 1 tablet (50 mg total) by mouth 3 (three) times daily as needed. What changed: when to take this   linaclotide 290 MCG Caps capsule Commonly known as: LINZESS Take 290 mcg by mouth daily before breakfast.   lisinopril 40 MG tablet Commonly known as: ZESTRIL Take 1 tablet (40 mg total) by mouth every evening.   meloxicam 15 MG tablet Commonly known as: MOBIC Take 15 mg by mouth daily.   methocarbamol 500 MG tablet Commonly known as: Robaxin Take 1 tablet (500 mg total) by mouth 4 (four) times daily.   metoprolol tartrate 25 MG tablet Commonly known as: LOPRESSOR Take 1 tablet (25 mg total) by mouth 2 (two) times daily.   Narcan 4 MG/0.1ML Liqd nasal spray kit Generic drug: naloxone Place 1 spray into the nose as needed (opioid overdose).   NARCAN IJ Inject 1 Dose as directed as needed (opioid overdose).   nitroGLYCERIN 0.4 MG SL tablet Commonly known as: NITROSTAT Place 1 tablet (0.4 mg total)  under the tongue every 5 (five) minutes x 3 doses as needed for chest pain.   omeprazole 40 MG capsule Commonly known as: PRILOSEC Take 1 capsule (40 mg total) by mouth 2 (two) times daily.   omeprazole 40 MG capsule Commonly known  as: PRILOSEC TAKE 1 CAPSULE BY MOUTH TWICE A DAY   ondansetron 4 MG tablet Commonly known as: ZOFRAN TAKE ONE TABLET BY MOUTH EVERY 8 HOURS AS NEEDED FOR NAUSEA AND VOMITING   Spiriva Respimat 2.5 MCG/ACT Aers Generic drug: Tiotropium Bromide Monohydrate INHALE 1 PUFF BY MOUTH INTO LUNGS ONCE DAILY   sucralfate 1 GM/10ML suspension Commonly known as: CARAFATE TAKE 10 MLS (1 G TOTAL) BY MOUTH 4 (FOUR) TIMES DAILY.   Trulance 3 MG Tabs Generic drug: Plecanatide Take 1 tablet by mouth daily.        Disposition: home   Final Dx: posterior cervical decompression and fusion C5-T1  Discharge Instructions     Call MD for:  difficulty breathing, headache or visual disturbances   Complete by: As directed    Call MD for:  hives   Complete by: As directed    Call MD for:  persistant dizziness or light-headedness   Complete by: As directed    Call MD for:  persistant nausea and vomiting   Complete by: As directed    Call MD for:  redness, tenderness, or signs of infection (pain, swelling, redness, odor or green/yellow discharge around incision site)   Complete by: As directed    Call MD for:  severe uncontrolled pain   Complete by: As directed    Call MD for:  temperature >100.4   Complete by: As directed    Diet - low sodium heart healthy   Complete by: As directed    Increase activity slowly   Complete by: As directed    Remove dressing in 24 hours   Complete by: As directed           Signed: Ocie Cornfield Addam Goeller 05/19/2021, 5:31 PM

## 2021-05-19 NOTE — Progress Notes (Signed)
NEUROSURGERY PROGRESS NOTE  Doing well. Complains of appropriate neck soreness. Some paresthesias in the arms still Ambulating and voiding well Good strength and sensation Incision CDI  Temp:  [97.9 F (36.6 C)-98.6 F (37 C)] 98 F (36.7 C) (11/26 0722) Pulse Rate:  [66-76] 66 (11/26 0722) Resp:  [17-20] 18 (11/26 0722) BP: (88-121)/(52-67) 102/61 (11/26 0743) SpO2:  [91 %-96 %] 94 % (11/26 0840)  Patient states that she is ready to go home. PT note yesterday recommended SNF for the first time since shes been here. We will see if PT will reassess her today and pending their recommendation, maybe discharge home later.    Sara Chiquito, NP 05/19/2021 10:21 AM

## 2021-05-19 NOTE — Progress Notes (Signed)
Discharge instruction given to patient, Including follow up appointments, medication, wound care etc.Patient verbalize of understanding.

## 2021-05-19 NOTE — Progress Notes (Signed)
Physical Therapy Treatment Patient Details Name: Sara Munoz MRN: [id number] DOB: 1965/08/04 Today's Date: 05/19/2021   History of Present Illness 55 yo female admitted 11/21 s/p  C5-6, C6-7 laminectomies with bilateral foraminotomies; C5-6, C6-7, C7-T1 posterior instrumented spinal fusion. PMH includes anxiety, GERD, COPD, CAD with history of MI and cath, hep C, HTN, OA, CVA.    PT Comments    Patient making progress towards physical therapy goals. Patient ambulating 120' with minA and RW. Patient negotiated 6 stairs with minA and rail on R + HHA on L. Patient unable to recall back precautions following OT session but able to recall at end of session with max cues. Educated patient on need for assistance with mobility at home, patient verbalized understanding. Patient states boyfriend is taking month off of work and will be able to care for her 24/7. Patient requesting to return home. Updated recommendation to HHPT at discharge as patient has made significant gains and has necessary assistance at home.    Recommendations for follow up therapy are one component of a multi-disciplinary discharge planning process, led by the attending physician.  Recommendations may be updated based on patient status, additional functional criteria and insurance authorization.  Follow Up Recommendations  Home health PT     Assistance Recommended at Discharge Frequent or constant Supervision/Assistance  Equipment Recommendations  Rolling Daryle Boyington (2 wheels)    Recommendations for Other Services       Precautions / Restrictions Precautions Precautions: Cervical;Fall Precaution Comments: unable to recall back precautions following OT session. At end of PT session, able to recall 3/3 precautions Required Braces or Orthoses: Cervical Brace Cervical Brace: Soft collar     Mobility  Bed Mobility               General bed mobility comments: Pt sitting up in recliner    Transfers Overall transfer  level: Needs assistance Equipment used: Rolling Devaunte Gasparini (2 wheels) Transfers: Sit to/from Stand Sit to Stand: Min guard           General transfer comment: min guard for safety    Ambulation/Gait Ambulation/Gait assistance: Min assist Gait Distance (Feet): 120 Feet Assistive device: Rolling Tamanna Whitson (2 wheels) Gait Pattern/deviations: Step-through pattern;Decreased stride length;Wide base of support Gait velocity: decreased     General Gait Details: minA for steadying and RW management   Stairs Stairs: Yes Stairs assistance: Min assist Stair Management: One rail Right;Alternating pattern;Forwards Number of Stairs: 6 General stair comments: rail on R and HHA on L. MinA for balance with alternating pattern on ascent and step to pattern on descent   Wheelchair Mobility    Modified Rankin (Stroke Patients Only)       Balance Overall balance assessment: Needs assistance Sitting-balance support: Feet supported Sitting balance-Leahy Scale: Good     Standing balance support: Bilateral upper extremity supported;During functional activity Standing balance-Leahy Scale: Poor                              Cognition Arousal/Alertness: Awake/alert Behavior During Therapy: WFL for tasks assessed/performed Overall Cognitive Status: Within Functional Limits for tasks assessed                                 General Comments: decreased recall of precautions requiring max cues. Able to recall at end of session        Exercises  General Comments General comments (skin integrity, edema, etc.): Pt now reports that her boyfriend is taking a month off of work, and will be available 24/7 to assist her as needed at discharge      Pertinent Vitals/Pain Pain Assessment: Faces Faces Pain Scale: Hurts little more Pain Location: neck Pain Descriptors / Indicators: Discomfort;Grimacing;Guarding;Aching;Constant Pain Intervention(s): Monitored during  session    Home Living                          Prior Function            PT Goals (current goals can now be found in the care plan section) Acute Rehab PT Goals PT Goal Formulation: With patient Time For Goal Achievement: 05/22/21 Potential to Achieve Goals: Good Progress towards PT goals: Progressing toward goals    Frequency    Min 5X/week      PT Plan Discharge plan needs to be updated    Co-evaluation              AM-PAC PT "6 Clicks" Mobility   Outcome Measure  Help needed turning from your back to your side while in a flat bed without using bedrails?: A Little Help needed moving from lying on your back to sitting on the side of a flat bed without using bedrails?: A Little Help needed moving to and from a bed to a chair (including a wheelchair)?: A Little Help needed standing up from a chair using your arms (e.g., wheelchair or bedside chair)?: A Little Help needed to walk in hospital room?: A Little Help needed climbing 3-5 steps with a railing? : A Little 6 Click Score: 18    End of Session Equipment Utilized During Treatment: Cervical collar;Gait belt Activity Tolerance: Patient tolerated treatment well Patient left: in chair;with call bell/phone within reach Nurse Communication: Mobility status PT Visit Diagnosis: Other abnormalities of gait and mobility (R26.89);Muscle weakness (generalized) (M62.81)     Time: 4332-9518 PT Time Calculation (min) (ACUTE ONLY): 15 min  Charges:  $Gait Training: 8-22 mins                     Armari Fussell A. Gilford Rile PT, DPT Acute Rehabilitation Services Pager [phone] Office [phone]    Linna Hoff 05/19/2021, 11:44 AM

## 2021-05-21 ENCOUNTER — Other Ambulatory Visit: Payer: Self-pay

## 2021-05-21 ENCOUNTER — Ambulatory Visit: Payer: Medicaid Other | Admitting: Neurology

## 2021-05-21 ENCOUNTER — Ambulatory Visit: Payer: Medicaid Other | Admitting: Gastroenterology

## 2021-05-21 ENCOUNTER — Encounter: Payer: Self-pay | Admitting: Gastroenterology

## 2021-05-21 ENCOUNTER — Telehealth: Payer: Self-pay

## 2021-05-21 ENCOUNTER — Encounter: Payer: Self-pay | Admitting: Neurology

## 2021-05-21 NOTE — Telephone Encounter (Signed)
Transition Care Management Follow-up Telephone Call Date of discharge and from where: 05/19/2021 from [facility] How have you been since you were released from the hospital? Pt stated that she is doing well and did not have any questions.  Any questions or concerns? No  Items Reviewed: Did the pt receive and understand the discharge instructions provided? Yes  Medications obtained and verified? Yes  Other? No  Any new allergies since your discharge? No  Dietary orders reviewed? No Do you have support at home? Yes   Functional Questionnaire: (I = Independent and D = Dependent) ADLs: I  Bathing/Dressing- I  Meal Prep- I  Eating- I  Maintaining continence- I  Transferring/Ambulation- I  Managing Meds- I   Follow up appointments reviewed:  PCP Hospital f/u appt confirmed? No   Specialist Hospital f/u appt confirmed? Yes  Scheduled to see Surgery follow up on 06/07/2021 but patient is not 100% sure of date and time at this moment.  Are transportation arrangements needed? No  If their condition worsens, is the pt aware to call PCP or go to the Emergency Dept.? Yes Was the patient provided with contact information for the PCP's office or ED? Yes Was to pt encouraged to call back with questions or concerns? Yes

## 2021-05-28 ENCOUNTER — Other Ambulatory Visit: Payer: Self-pay | Admitting: Licensed Clinical Social Worker

## 2021-05-28 NOTE — Patient Instructions (Signed)
Visit Information  Sara Munoz was given information about Medicaid Managed Care team care coordination services as a part of their [place] Complete Medicaid benefit. Sara Munoz verbally consented to engagement with the [facility] Managed Care team.   If you are experiencing a medical emergency, please call 911 or report to your local emergency department or urgent care.   If you have a non-emergency medical problem during routine business hours, please contact your provider's office and ask to speak with a nurse.   For questions related to your [place] Complete Medicaid health plan, please call: [phone]  If you would like to schedule transportation through your [place] Complete Medicaid plan, please call the following number at least 2 days in advance of your appointment: [phone].   Call the [place] at [phone], at any time, 24 hours a day, 7 days a week. If you are in danger or need immediate medical attention call 911.  If you would like help to quit smoking, call 1-800-QUIT-NOW (774) 373-2012) OR Espaol: 1-855-Djelo-Ya (2-637-858-8502) o para ms informacin haga clic aqu or Text READY to 200-400 to register via text   Following is a copy of your plan of care:  Care Plan : LCSW Plan of Care  Updates made by Greg Cutter, LCSW since 05/28/2021 12:00 AM     Problem: Anxiety Identification (Anxiety)      Long-Range Goal: Anxiety Symptoms Identified   Start Date: 01/04/2021  Recent Progress: On track  Priority: High  Note:   Timeframe:  Long-Range Goal Priority:  High Start Date:    01/04/21                         Expected End Date:   ongoing                    Follow Up Date 06/12/21  Current barriers:   Chronic Mental Health needs related to anxiety, stress and depression Limited social support, Mental Health Concerns , and Social Isolation Needs Support, Education, and Care Coordination in order to meet unmet mental health  needs. Clinical Goal(s): patient will work with SW to address concerns related to increasing coping skills to help managed mood patient will work with Lydia to address needs related to finding a mental health psychiatrist     Patient Goals/Self-Care Activities: Over the next 120 days - barriers to treatment adherence identified - complementary therapy use encouraged - counseling provided - depression screen reviewed - self-awareness of emotional triggers encouraged - strategies to manage emotional triggers promoted - avoid negative self-talk - develop a personal safety plan - develop a plan to deal with triggers like holidays, anniversaries - exercise at least 2 to 3 times per week - have a plan for how to handle bad days - journal feelings and what helps to feel better or worse - spend time or talk with others at least 2 to 3 times per week - spend time or talk with others every day - watch for early signs of feeling worse - write in journal every day - begin personal counseling - call and visit an old friend - check out volunteer opportunities - join a support group - laugh; watch a funny movie or comedian - learn and use visualization or guided imagery - perform a random act of kindness - practice relaxation or meditation daily - start or continue a personal journal - talk about feelings with a friend, family or  spiritual advisor - practice positive thinking and self-talk        [name], BSW, MSW, [employer] Managed Medicaid LCSW [email or web address] Phone: [phone]

## 2021-05-28 NOTE — Patient Outreach (Signed)
Medicaid Managed Care Social Work Note  05/28/2021 Name:  Sara Munoz MRN:  [id number] DOB:  12/22/1965  Sara Munoz is an 55 y.o. year old female who is a primary patient of [name], [name] (Inactive).  The Medicaid Managed Care Coordination team was consulted for assistance with:  [place] and Resources  Sara Munoz was given information about Medicaid Managed Care Coordination team services today. Sara Munoz Patient agreed to services and verbal consent obtained.  Engaged with patient  for by telephone forfollow up visit in response to referral for case management and/or care coordination services.   Assessments/Interventions:  Review of past medical history, allergies, medications, health status, including review of consultants reports, laboratory and other test data, was performed as part of comprehensive evaluation and provision of chronic care management services.  SDOH: (Social Determinant of Health) assessments and interventions performed: SDOH Interventions    Flowsheet Row Most Recent Value  SDOH Interventions   Stress Interventions Provide Counseling, Offered [employer] Resources       Advanced Directives Status:  See Care Plan for related entries.  Care Plan                 Allergies  Allergen Reactions   Flexeril [Cyclobenzaprine] Hives and Swelling    Facial/lip swelling      Levofloxacin Hives and Swelling   Phenergan [Promethazine Hcl] Other (See Comments)    Agitation.    Toradol [Ketorolac Tromethamine] Swelling and Other (See Comments)    Facial/tongue swelling    Tramadol Hives, Swelling and Other (See Comments)    Lip swelling    Zoloft [Sertraline Hcl] Swelling    Tongue swelling       Medications Reviewed Today     Reviewed by Maude Leriche, CRNA (Certified Registered Nurse Anesthetist) on 05/14/21 at 1248  Med List Status: Complete   Medication Order Taking? Sig Documenting Provider Last Dose Status  Informant  albuterol (PROAIR HFA) 108 (90 Base) MCG/ACT inhaler [id number] Yes INHALE TWO PUFFS BY MOUTH INTO LUNGS EVERY 6 HOURS AS NEEDED FOR SHORTNESS OF BREATH OR wheezing Jerrol Banana., MD 05/14/2021 0900 Active Self  albuterol (PROVENTIL) (2.5 MG/3ML) 0.083% nebulizer solution [id number] Yes Take 3 mLs (2.5 mg total) by nebulization every 6 (six) hours as needed for wheezing or shortness of breath. [name], MD 05/13/2021 Active Self  amLODipine (NORVASC) 5 MG tablet [id number] Yes Take 1 tablet (5 mg total) by mouth daily. Jerrol Banana., MD 05/13/2021 Active Self  atorvastatin (LIPITOR) 20 MG tablet [id number] Yes Take 1 tablet (20 mg total) by mouth every evening. Jerrol Banana., MD 05/13/2021 Active Self  budesonide-formoterol (SYMBICORT) 160-4.5 MCG/ACT inhaler [id number] Yes Inhale 2 puffs into the lungs in the morning and at bedtime. Jerrol Banana., MD 05/14/2021 0600 Active Self  buprenorphine-naloxone (SUBOXONE) 8-2 mg SUBL SL tablet [id number] Yes Place 1 tablet under the tongue in the morning and at bedtime. [provider] 05/08/2021 Active Self           Med Note Ivor Reining May 03, 2021 11:27 AM) Per patient md is planning to switch patient on 05/08/21 to oxycodone  docusate sodium (COLACE) 100 MG capsule [id number] Yes Take 100 mg by mouth 2 (two) times daily. [provider] 05/13/2021 Active Self  DULoxetine (CYMBALTA) 30 MG capsule [id number] Yes Take 30 mg by mouth 2 (two) times daily. [provider] 05/13/2021 Active Self  DULoxetine (CYMBALTA) 60 MG capsule [id number] No Take 1 capsule (60 mg total) by mouth daily. Please call [phone] to schedule appt or may request future refills from PCP.  Patient not taking: No sig reported   [name], MD Not Taking Active Self  gabapentin (NEURONTIN) 600 MG tablet [id number] Yes Take 600 mg by mouth 4 (four) times daily. [provider] 05/14/2021 0600  Active Self  hydrOXYzine (ATARAX/VISTARIL) 50 MG tablet [id number] Yes Take 1 tablet (50 mg total) by mouth 3 (three) times daily as needed.  Patient taking differently: Take 50 mg by mouth at bedtime.   Sable Feil, [name] 05/13/2021 Active Self  linaclotide (LINZESS) 290 MCG CAPS capsule [id number] Yes Take 290 mcg by mouth daily before breakfast. [provider] 05/13/2021 Active Self  lisinopril (ZESTRIL) 40 MG tablet [id number] Yes Take 1 tablet (40 mg total) by mouth every evening. Jerrol Banana., MD 05/13/2021 Active Self  meloxicam (MOBIC) 15 MG tablet [id number] Yes Take 15 mg by mouth daily. [provider] 05/14/2021 0600 Active Self  methylPREDNISolone (MEDROL DOSEPAK) 4 MG TBPK tablet [id number] No Take Tapered dose as directed  Patient not taking: No sig reported   Sable Feil, [name] Not Taking Active Self  metoprolol tartrate (LOPRESSOR) 25 MG tablet [id number] Yes Take 1 tablet (25 mg total) by mouth 2 (two) times daily. Jerrol Banana., MD 05/14/2021 0600 Active Self  naloxone (NARCAN) 4 MG/0.1ML LIQD nasal spray kit [id number] Yes Place 1 spray into the nose as needed (opioid overdose). [provider]  Active Self           Med Note Ivor Reining May 03, 2021 11:23 AM)    Naloxone HCl [facility] IJ) [id number] Yes Inject 1 Dose as directed as needed (opioid overdose). [provider]  Active Self           Med Note Ivor Reining May 03, 2021 11:32 AM)    nitroGLYCERIN (NITROSTAT) 0.4 MG SL tablet [id number] No Place 1 tablet (0.4 mg total) under the tongue every 5 (five) minutes x 3 doses as needed for chest pain. [name], [name], [name] More than a month Active Self  omeprazole (PRILOSEC) 40 MG capsule [id number] No Take 1 capsule (40 mg total) by mouth 2 (two) times daily.  Patient not taking: Reported on 05/03/2021   Sara Bellows, MD Not Taking Expired 02/11/21 2359   omeprazole (PRILOSEC) 40 MG capsule  [id number] Yes TAKE 1 CAPSULE BY MOUTH TWICE A Sara Boyer, MD 05/14/2021 0600 Active Self  ondansetron (ZOFRAN) 4 MG tablet [id number] Yes TAKE ONE TABLET BY MOUTH EVERY 8 HOURS AS NEEDED FOR NAUSEA AND VOMITING Sara Bellows, MD 05/14/2021 0600 Active   oxyCODONE (ROXICODONE) 5 MG immediate release tablet [id number] No Take 4 tablets (20 mg total) by mouth every 6 (six) hours as needed (pain).  Patient not taking: No sig reported   Judith Part, MD Not Taking Active Self  Plecanatide (TRULANCE) 3 MG TABS [id number]  Take 1 tablet by mouth daily. Sara Bellows, MD  Active Self           Med Note Ivor Reining May 03, 2021 11:29 AM) Has not started yet, will finish linzess first   Banks 2.5 MCG/ACT AERS [id number] Yes INHALE 1 PUFF BY MOUTH INTO LUNGS ONCE DAILY Virginia Crews, MD 05/13/2021 Active Self  sucralfate (CARAFATE) 1 GM/10ML suspension [id number] Yes  TAKE 10 MLS (1 G TOTAL) BY MOUTH 4 (FOUR) TIMES DAILY. Sara Bellows, MD 05/13/2021 Active Self            Patient Active Problem List   Diagnosis Date Noted   Pseudoarthrosis of cervical spine ([place]) 05/14/2021   Lumbar radiculopathy 05/23/2020   Sacroiliac inflammation ([place]) 11/25/2019   Sepsis ([place]) 10/19/2018   Severe sepsis ([place]) 10/19/2018   Cervical radiculopathy 02/12/2018   Chronic migraine 02/12/2018   Paresthesia 08/29/2017   Neck pain 08/29/2017   Daily headache 08/29/2017   Chronic active hepatitis ([place]) 07/29/2017   Neurogenic pain 07/31/2016   Chronic pain syndrome 06/05/2016   Carotid artery stenosis 02/27/2016   Chronic constipation 02/27/2016   Chronic nausea 02/27/2016   Hypercholesterolemia 02/27/2016   Osteoarthritis 02/27/2016   PTSD (post-traumatic stress disorder) 02/27/2016   TIA (transient ischemic attack) 02/27/2016   Long term current use of opiate analgesic 02/27/2016   Long term prescription opiate use 02/27/2016   Encounter for pain management consult 02/27/2016    Chronic hip pain (Location of Tertiary source of pain) (Bilateral) (L>R) 02/27/2016   Chronic knee pain (Bilateral) (L>R) 02/27/2016   Chronic shoulder pain (Bilateral) (L>R) 02/27/2016   Chronic sacroiliac joint pain (Bilateral) (L>R) 02/27/2016   Chronic low back pain (Location of Primary Source of Pain) (Bilateral) (L>R) 02/27/2016   Chronic lower extremity pain (Location of Secondary source of pain) (Bilateral) (L>R) 02/27/2016   Osteoarthritis of hip (Bilateral) (L>R) 02/27/2016   Chronic neck pain (posterior midline) (Bilateral) (L>R) 02/27/2016   Cervicogenic headache (Bilateral) (L>R) 02/27/2016   Occipital headache (Bilateral) (L>R) 02/27/2016   Chronic upper extremity pain (Bilateral) (L>R) 02/27/2016   Chronic Cervical radicular pain (Bilateral) (L>R) 02/27/2016   Chronic lumbar radicular pain (Right) (L5 dermatome) 02/27/2016   Lumbar facet syndrome (Bilateral) (L>R) 02/27/2016   Long term prescription benzodiazepine use 02/27/2016   Hypertension    Chronic obstructive pulmonary disease ([place]) 10/09/2015   GERD (gastroesophageal reflux disease) 10/09/2015   Non-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/2014 08/02/2015   Hyperlipidemia 08/02/2015   Costochondritis 08/02/2015   Drug abuse, 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[place]) 09/03/2014   GAD (generalized anxiety disorder) 09/03/2014   Narcotic dependence ([place]) 09/03/2014   PVD (peripheral vascular disease) ([place]) 09/03/2014   Smoker 09/03/2014   Cigarette nicotine dependence with nicotine-induced disorder 07/08/2014   Anemia of chronic disease 03/19/2014   Narcotic abuse, continuous ([place]) 03/19/2014   Polysubstance abuse ([place]) 03/19/2014   MSSA (methicillin susceptible Staphylococcus aureus) septicemia ([place]) 03/07/2014   Hypomagnesemia 01/04/2014   Chronic cough 09/04/2011   Sleep apnea, obstructive 09/04/2011   Sinusitis, acute 09/04/2011    Conditions to be addressed/monitored per PCP order:  Anxiety and Depression  Care Plan : LCSW Plan of Care   Updates made by Greg Cutter, LCSW since 05/28/2021 12:00 AM     Problem: Anxiety Identification (Anxiety)      Long-Range Goal: Anxiety Symptoms Identified   Start Date: 01/04/2021  Recent Progress: On track  Priority: High  Note:   Timeframe:  Long-Range Goal Priority:  High Start Date:    01/04/21                         Expected End Date:   ongoing                    Follow Up Date 06/12/21  Current barriers:   Chronic Mental Health needs related to anxiety, stress and depression Limited social  support, Mental Health Concerns , and Social Isolation Needs Support, Education, and Care Coordination in order to meet unmet mental health needs. Clinical Goal(s): patient will work with SW to address concerns related to increasing coping skills to help managed mood patient will work with [place] to address needs related to finding a mental health psychiatrist    Clinical Interventions:  Assessed patient's previous and current treatment, coping skills, support system and barriers to care  Patient reports that she is in need of only medication management. She moved [facility] to [facility]. Patient reports that she already has a Social worker in [place] at [employer] that she has built Museum/gallery exhibitions officer with. Patient is agreeable to [place] referral in order to find a psychiatrist for medication management. Referral placed on 01/04/21. Patient has her first initial appointment with a psychiatric nurse practitioner on 02/02/21 at 1:00 pm. This appointment is virtual and she has already received the link to join tele health session for 02/02/21. Update- 02/19/21- Patient reports that she successfully attended her virtual psychiatry visit with her matched psychiatric PA and was placed on a new medication. She reports liking her matched provider and has a follow up appointment with him next month. She was provided [facility]'s customer service number if she encounters  any problems with this upcoming virtual visit. Update 04/20/21 has had made great progress with mental health treatment through [facility]. Their update by provider assigned to patient sstated Regression-None Change Minimal Progress-Some Progress (Progress since beginning of treatment). Patient by Berniece Pap on 04/13/21. Patient reports that she has built great rapport with provider. Update- Patient has her next appointment with her psychiatrist on 05/31/21. These sessions remain virtual but her counseling sessions are in person. Patient was questioned about how her counseling sessions were going and she stated that she needs to contact them and schedule an appointment. Patient reports that her surgery on 05/14/21 was successful but she is experiencing pain which is affecting her sleep. Patient has her pain management appointment on 06/05/21 and her follow up surgery appointment on 06/07/21. Patient's significant other asked off of work for 2 weeks to assist her with her recovery.  LCSW provided education on relaxation techniques such as meditation, deep breathing, massage, grounding exercsies or yoga that can activate the body's relaxation response and ease symptoms of stress and anxiety. LCSW ask that when pt is struggling with difficult emotions and racing thoughts that they start this relaxation response process. LCSW provided extensive education on healthy coping skills for anxiety. SW used active and reflective listening, validated patient's feelings/concerns, and provided emotional support.    24- Hour Availability:    [facility]  [address] [place], [place] [facility] [phone]   [facility] [employer] [place]  [phone]    [place]  [phone] (after hours)   Therapeutic Alternative/Mobile Crisis   [phone]   [place] National Suicide Hotline  434-500-2999  Diamantina Monks) [place] 988   Call 911 or go to emergency room   [facility]  [phone]);  [place] and [employer]  [phone]); [place], [place], [place], [place], [place], [place], [place]        Review various resources, discussed options and provided patient information about Discussed several options for long term counseling based on need and insurance Depression screen reviewed , Solution-Focused Strategies, Mindfulness or Psychologist, educational, Active listening / Reflection utilized , Emotional  Supportive Provided, Brief CBT , and Suicidal Ideation/Homicidal Ideation assessed: ; Patient interviewed and appropriate assessments performed Discussed plans with patient for ongoing care management follow up and provided patient with direct contact information for care management team Assisted patient/caregiver with obtaining information about health plan benefits Provided education and assistance to client regarding Advanced Directives. Discussed several options for long term counseling based on need and insurance.  Inter-disciplinary care team collaboration (see longitudinal plan of care) Patient reports that she is still having issues with insomnia and is not sleeping well at night.  LCSW provided education on healthy sleep hygiene and what that looks like. LCSW encouraged patient to implement a night time routine into her schedule that works best for her and that she is able to maintain. Advised patient to implement deep breathing/grounding/meditation/self-care exercises into her nightly routine to combat racing thoughts at night. LCSW encouraged patient to wake up at the same time each day, make her sleeping environment comfortable, exercise when able, to limit naps and to not eat or drink anything right before bed. 05/09/21 Update- Patient reports that she is doing well but experiencing chronic back pain. She has a spine surgery scheduled for 05/14/21. Patient reports  that she no longer goes to counseling weekly at [facility] due to her improvement in her mental health. She reports going once per month now. Patient's next appointment with her psychiatrist that [facility] matched her with is on 05/25/21. Patient reports that she is taking her cymbalta and hydroxyzine as prescribed and she feels that these medications are effectively working for her. Patient shares that her main support is "My boyfriend Jeneen Rinks and God." Patient has a neurologist and GI appointment on 05/21/21 and has stable transportation to these appointments through her insurance.   Patient Goals/Self-Care Activities: Over the next 120 days - barriers to treatment adherence identified - complementary therapy use encouraged - counseling provided - depression screen reviewed - self-awareness of emotional triggers encouraged - strategies to manage emotional triggers promoted - avoid negative self-talk - develop a personal safety plan - develop a plan to deal with triggers like holidays, anniversaries - exercise at least 2 to 3 times per week - have a plan for how to handle bad days - journal feelings and what helps to feel better or worse - spend time or talk with others at least 2 to 3 times per week - spend time or talk with others every day - watch for early signs of feeling worse - write in journal every day - begin personal counseling - call and visit an old friend - check out volunteer opportunities - join a support group - laugh; watch a funny movie or comedian - learn and use visualization or guided imagery - perform a random act of kindness - practice relaxation or meditation daily - start or continue a personal journal - talk about feelings with a friend, family or spiritual advisor - practice positive thinking and self-talk        Follow up:  Patient agrees to Care Plan and Follow-up.  Plan: The Managed Medicaid care management team will reach out to the  patient again over the next 30 days.  Date/time of next scheduled Social Work care management/care coordination outreach:  06/12/21 at 12:30 pm  [name], BSW, MSW, [place] Medicaid LCSW [email or web address] Phone: [phone]

## 2021-05-31 DIAGNOSIS — G4700 Insomnia, unspecified: Secondary | ICD-10-CM | POA: Diagnosis not present

## 2021-05-31 DIAGNOSIS — F411 Generalized anxiety disorder: Secondary | ICD-10-CM | POA: Diagnosis not present

## 2021-06-01 ENCOUNTER — Other Ambulatory Visit: Payer: Self-pay

## 2021-06-01 ENCOUNTER — Other Ambulatory Visit: Payer: Self-pay | Admitting: Obstetrics and Gynecology

## 2021-06-01 NOTE — Patient Instructions (Signed)
Hi Sara Munoz to speak with you today-glad you are recovering well!  Sara Munoz was given information about Medicaid Managed Care team care coordination services as a part of their [place] Complete Medicaid benefit. [name] verbally consented to engagement with the [facility] Managed Care team.   If you are experiencing a medical emergency, please call 911 or report to your local emergency department or urgent care.   If you have a non-emergency medical problem during routine business hours, please contact your provider's office and ask to speak with a nurse.   For questions related to your [place] Complete Medicaid health plan, please call: [phone]  If you would like to schedule transportation through your [place] Complete Medicaid plan, please call the following number at least 2 days in advance of your appointment: [phone].   Call the [place] at [phone], at any time, 24 hours a day, 7 days a week. If you are in danger or need immediate medical attention call 911.  If you would like help to quit smoking, call 1-800-QUIT-NOW 302-610-1449) OR Espaol: 1-855-Djelo-Ya (9-798-921-1941) o para ms informacin haga clic aqu or Text READY to 200-400 to register via text  Sara Munoz - following are the goals we discussed in your visit today: - schedule appointment for flu shot - schedule appointment for vaccines needed due to my age or health - schedule recommended health tests (blood work, mammogram, colonoscopy, pap test) - schedule and keep appointment for annual check-up  The patient verbalized understanding of instructions provided today and declined a print copy of patient instruction materials.   The Managed Medicaid care management team will reach out to the patient again over the next 30 days.  The  Patient  has been provided with contact information for the Managed Medicaid care management team and has been advised to call with any  health related questions or concerns.   [name] RN, BSN [place]  [facility] - Managed Medicaid High Risk [phone].   Following is a copy of your plan of care:  Care Plan : General Plan of Care (Adult)  Updates made by [name], RN since 06/01/2021 12:00 AM     Problem: Health Promotion or Disease Self-Management (General Plan of Care)   Priority: High  Onset Date: 12/20/2020     Long-Range Goal: Self-Management Plan Developed   Start Date: 12/20/2020  Expected End Date: 08/30/2021  Recent Progress: Not on track  Priority: High  Note:   Current Barriers:  Knowledge Deficits related to short term plan for care coordination needs and long term plans for chronic disease management needs.  Patient with many chronic health conditions-CAD, HTN, PVD, TIA, migraines, COPD, OSA, GERD, hepatitis, tobacco use, osteoarthritis, chronic pain, anxiety 06/01/21:  patient without complaint today, surgery 05/14/21, recovering well. Nurse Case Manager Clinical Goal(s):  patient will work with care management team to address care coordination and chronic disease management needs related to Disease Management Educational Needs Care Coordination Medication Management and Education Medication Reconciliation Medication Assistance  Psychosocial Support Mental Health Counseling   Interventions:  Evaluation of current treatment plan  and patient's adherence to plan as established by provider. Reviewed medications with patient. Collaborated with pharmacy regarding medications.Marland Kitchen  Collaborated with SW regarding anxiety. Discussed plans with patient for ongoing care management follow up and provided patient with direct contact information for care management team Advised patient, providing education and rationale, to monitor blood pressure daily and record, calling PCP  for findings outside established parameters.  Reviewed scheduled/upcoming provider  appointments. Social Work referral for anxiety, PTSD-completed Pharmacy referral for medication review-completed. Patient given 1-800-QUITNOW for smoking cessation if interested. 06/01/21: smokes 1/2 ppd Self Care Activities:  Patient will self administer medications as prescribed Patient will attend all scheduled provider appointments Patient will call pharmacy for medication refills Patient will continue to perform ADL's independently Patient will call provider office for new concerns or questions Patient Goals: In the next 90 days, patient will attend all scheduled appointments. - Follow Up Plan: The patient has been provided with contact information for the care management team and has been advised to call with any health related questions or concerns.  The care management team will reach out to the patient again over the next 30 days.  Evidence-based guidance:   Review biopsychosocial determinants of health screens.  Review need for preventive screening based on age, sex, family history and health history.  Determine level of modifiable health risk.   Discuss identified risks.  Identify areas where behavior change may lead to improved health.  Promote healthy lifestyle.  Evoke change talk using open-ended questions, pros and cons, as well as looking forward.  Identify and manage conditions or preconditions to reduce health risk.  Implement additional goals and interventions based on identified risk factors.

## 2021-06-01 NOTE — Patient Outreach (Signed)
Medicaid Managed Care   Nurse Care Manager Note  12/9/Munoz Name:  Sara Munoz MRN:  [id number] DOB:  03-08-1966  Sara Munoz is an 55 y.o. year old female who is a primary patient of Sara Post, [name] (Inactive).  The [facility] Managed Care Coordination team was consulted for assistance with:    Chronic healthcare management needs  Sara Munoz was given information about Medicaid Managed Care Coordination team services today. Sara Munoz Patient agreed to services and verbal consent obtained.  Engaged with patient by telephone for follow up visit in response to provider referral for case management and/or care coordination services.   Assessments/Interventions:  Review of past medical history, allergies, medications, health status, including review of consultants reports, laboratory and other test data, was performed as part of comprehensive evaluation and provision of chronic care management services.  SDOH (Social Determinants of Health) assessments and interventions performed: SDOH Interventions    Flowsheet Row Most Recent Value  SDOH Interventions   Transportation Interventions --  [employer] insurance company transportation]       Care Plan  Allergies  Allergen Reactions   Flexeril [Cyclobenzaprine] Hives and Swelling    Facial/lip swelling      Levofloxacin Hives and Swelling   Phenergan [Promethazine Hcl] Other (See Comments)    Agitation.    Toradol [Ketorolac Tromethamine] Swelling and Other (See Comments)    Facial/tongue swelling    Tramadol Hives, Swelling and Other (See Comments)    Lip swelling    Zoloft [Sertraline Hcl] Swelling    Tongue swelling       Medications Reviewed Today     Reviewed by Sara Munoz (Registered Nurse) on 06/01/21 at [place] List Status: <None>   Medication Order Taking? Sig Documenting Provider Last Dose Status Informant  albuterol (PROAIR HFA) 108 (90 Base) MCG/ACT inhaler [id number]  INHALE TWO PUFFS BY MOUTH  INTO LUNGS EVERY 6 HOURS AS NEEDED FOR SHORTNESS OF BREATH OR wheezing Sara Munoz., MD  Active Self  albuterol (PROVENTIL) (2.5 MG/3ML) 0.083% nebulizer solution [id number]  Take 3 mLs (2.5 mg total) by nebulization every 6 (six) hours as needed for wheezing or shortness of breath. Sara Pita, MD  Active Self  amLODipine (NORVASC) 5 MG tablet [id number]  Take 1 tablet (5 mg total) by mouth daily. Sara Munoz., MD  Active Self  atorvastatin (LIPITOR) 20 MG tablet [id number]  Take 1 tablet (20 mg total) by mouth every evening. Sara Munoz., MD  Active Self  budesonide-formoterol [facility]) 160-4.5 MCG/ACT inhaler [id number]  Inhale 2 puffs into the lungs in the morning and at bedtime. Sara Munoz., MD  Active Self  buprenorphine-naloxone (SUBOXONE) 8-2 mg SUBL SL tablet [id number] No Place 1 tablet under the tongue in the morning and at bedtime.  Patient not taking: Reported on 12/9/Munoz   [provider] Not Taking Active Self           Med Note Sara Munoz 11:27 AM) Per patient md is planning to switch patient on 05/08/21 to oxycodone  docusate sodium (COLACE) 100 MG capsule [id number]  Take 100 mg by mouth 2 (two) times daily. [provider]  Active Self  DULoxetine (CYMBALTA) 30 MG capsule [id number]  Take 30 mg by mouth 2 (two) times daily. [provider]  Active Self  DULoxetine (CYMBALTA) 60 MG capsule [id number]  Take 1 capsule (60 mg total) by  mouth daily. Please call [phone] to schedule appt or may request future refills from PCP.  Patient not taking: No sig reported   Sara Pacas, MD  Active Self  gabapentin (NEURONTIN) 600 MG tablet [id number]  Take 600 mg by mouth 4 (four) times daily. [provider]  Active Self  hydrOXYzine (ATARAX/VISTARIL) 50 MG tablet [id number]  Take 1 tablet (50 mg total) by mouth 3 (three) times daily as needed.  Patient taking differently: Take 50 mg by mouth  at bedtime.   Sara Feil, [name]  Active Self  linaclotide Rolan Lipa) 290 MCG CAPS capsule [id number]  Take 290 mcg by mouth daily before breakfast. [provider]  Active Self  lisinopril (ZESTRIL) 40 MG tablet [id number]  Take 1 tablet (40 mg total) by mouth every evening. Sara Munoz., MD  Active Self  methocarbamol (ROBAXIN) 500 MG tablet [id number]  Take 1 tablet (500 mg total) by mouth 4 (four) times daily. Meyran, Ocie Cornfield, NP  Active   metoprolol tartrate (LOPRESSOR) 25 MG tablet [id number]  Take 1 tablet (25 mg total) by mouth 2 (two) times daily. Sara Munoz., MD  Active Self  naloxone [facility]) 4 MG/0.1ML LIQD nasal spray kit [id number]  Place 1 spray into the nose as needed (opioid overdose). [provider]  Active Self           Med Note Sara Munoz 11:23 AM)    Naloxone HCl [facility]) [id number]  Inject 1 Dose as directed as needed (opioid overdose). [provider]  Active Self           Med Note Sara Munoz 11:32 AM)    naproxen (NAPROSYN) 500 MG tablet [id number]  Take 500 mg by mouth 2 (two) times daily. [provider]  Active   nitroGLYCERIN (NITROSTAT) 0.4 MG SL tablet [id number]  Place 1 tablet (0.4 mg total) under the tongue every 5 (five) minutes x 3 doses as needed for chest pain. [name], [name], [name]  Active Self  omeprazole (PRILOSEC) 40 MG capsule [id number]  Take 1 capsule (40 mg total) by mouth 2 (two) times daily.  Patient not taking: Reported on 11/10/Munoz   [name], MD  Expired 02/11/21 2359   omeprazole (PRILOSEC) 40 MG capsule [id number]  TAKE 1 CAPSULE BY MOUTH TWICE A DAY [name], MD  Active Self  ondansetron (ZOFRAN-ODT) 8 MG disintegrating tablet [id number]  Take 8 mg by mouth 2 (two) times daily as needed. [provider]  Active   Oxycodone HCl 10 MG TABS [id number]  Take 10 mg by mouth every 4 (four) hours as needed. [provider]  Active   Plecanatide (TRULANCE) 3 MG TABS [id number] Yes Take 1 tablet by mouth daily. [name], MD Taking Active Self           Med Note Sara Munoz 11:29 AM) Has not started yet, will finish linzess first   [place] 2.5 MCG/ACT AERS [id number]  INHALE 1 PUFF BY MOUTH INTO LUNGS ONCE DAILY Bacigalupo, Dionne Bucy, MD  Active Self  sucralfate (CARAFATE) 1 GM/10ML suspension [id number]  TAKE 10 MLS (1 G TOTAL) BY MOUTH 4 (FOUR) TIMES DAILY. [name], MD  Active Self            Patient Active Problem List   Diagnosis Date Noted   Pseudoarthrosis of cervical spine ([place])  11/21/Munoz   Lumbar radiculopathy 05/23/2020   Sacroiliac inflammation ([place]) 11/25/2019   Sepsis ([place]) 10/19/2018   Severe sepsis ([place]) 10/19/2018   Cervical radiculopathy 02/12/2018   Chronic migraine 02/12/2018   Paresthesia 08/29/2017   Neck pain 08/29/2017   Daily headache 08/29/2017   Chronic active hepatitis ([place]) 07/29/2017   Neurogenic pain 07/31/2016   Chronic pain syndrome 06/05/2016   Carotid artery stenosis 02/27/2016   Chronic constipation 02/27/2016   Chronic nausea 02/27/2016   Hypercholesterolemia 02/27/2016   Osteoarthritis 02/27/2016   PTSD (Munoz-traumatic stress disorder) 02/27/2016   TIA (transient ischemic attack) 02/27/2016   Long term current use of opiate analgesic 02/27/2016   Long term prescription opiate use 02/27/2016   Encounter for pain management consult 02/27/2016   Chronic hip pain (Location of Tertiary source of pain) (Bilateral) (L>R) 02/27/2016   Chronic knee pain (Bilateral) (L>R) 02/27/2016   Chronic shoulder pain (Bilateral) (L>R) 02/27/2016   Chronic sacroiliac joint pain (Bilateral) (L>R) 02/27/2016   Chronic low back pain (Location of Primary Source of Pain) (Bilateral) (L>R) 02/27/2016   Chronic lower extremity pain (Location of Secondary source of pain) (Bilateral) (L>R) 02/27/2016   Osteoarthritis of hip (Bilateral)  (L>R) 02/27/2016   Chronic neck pain (posterior midline) (Bilateral) (L>R) 02/27/2016   Cervicogenic headache (Bilateral) (L>R) 02/27/2016   Occipital headache (Bilateral) (L>R) 02/27/2016   Chronic upper extremity pain (Bilateral) (L>R) 02/27/2016   Chronic Cervical radicular pain (Bilateral) (L>R) 02/27/2016   Chronic lumbar radicular pain (Right) (L5 dermatome) 02/27/2016   Lumbar facet syndrome (Bilateral) (L>R) 02/27/2016   Long term prescription benzodiazepine use 02/27/2016   Hypertension    Chronic obstructive pulmonary disease ([place]) 10/09/2015   GERD (gastroesophageal reflux disease) 10/09/2015   Non-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/2014 08/02/2015   Hyperlipidemia 08/02/2015   Costochondritis 08/02/2015   Drug abuse, 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/03/2014   GAD (generalized anxiety disorder) 09/03/2014   Narcotic dependence ([place]) 09/03/2014   PVD (peripheral vascular disease) ([place]) 09/03/2014   Smoker 09/03/2014   Cigarette nicotine dependence with nicotine-induced disorder 07/08/2014   Anemia of chronic disease 03/19/2014   Narcotic abuse, continuous ([place]) 03/19/2014   Polysubstance abuse ([place]) 03/19/2014   MSSA (methicillin susceptible Staphylococcus aureus) septicemia ([place]) 03/07/2014   Hypomagnesemia 01/04/2014   Chronic cough 09/04/2011   Sleep apnea, obstructive 09/04/2011   Sinusitis, acute 09/04/2011    Conditions to be addressed/monitored per PCP order:   chronic care management needs, CAD, chronic pain, HTN, GERD, migraines, COPD, OSA, PTSD, ostoarthritis, GAD, tobacco use, HLD  Care Plan : General Plan of Care (Adult)  Updates made by Sara Munoz since 12/9/Munoz 12:00 AM     Problem: Health Promotion or Disease Self-Management (General Plan of Care)   Priority: High  Onset Date: 6/29/Munoz     Long-Range Goal: Self-Management Plan Developed   Start Date: 6/29/Munoz  Expected End Date: 08/30/2021  Recent Progress: Not on track  Priority: High  Note:    Current  Barriers:  Knowledge Deficits related to short term plan for care coordination needs and long term plans for chronic disease management needs.  Patient with many chronic health conditions-CAD, HTN, PVD, TIA, migraines, COPD, OSA, GERD, hepatitis, tobacco use, osteoarthritis, chronic pain, anxiety 06/01/21:  patient without complaint today, surgery 05/14/21, recovering well. Nurse Case Manager Clinical Goal(s):  patient will work with care management team to address care coordination and chronic disease management needs related to Disease Management Educational Needs Care Coordination Medication Management and Education Medication Reconciliation Medication  Assistance  Psychosocial Support Mental Health Counseling   Interventions:  Evaluation of current treatment plan  and patient's adherence to plan as established by provider. Reviewed medications with patient. Collaborated with pharmacy regarding medications.Marland Kitchen  Collaborated with SW regarding anxiety. Discussed plans with patient for ongoing care management follow up and provided patient with direct contact information for care management team Advised patient, providing education and rationale, to monitor blood pressure daily and record, calling PCP for findings outside established parameters.  Reviewed scheduled/upcoming provider appointments. Social Work referral for anxiety, PTSD-completed Pharmacy referral for medication review-completed. Patient given 1-800-QUITNOW for smoking cessation if interested. 06/01/21: smokes 1/2 ppd Self Care Activities:  Patient will self administer medications as prescribed Patient will attend all scheduled provider appointments Patient will call pharmacy for medication refills Patient will continue to perform ADL's independently Patient will call provider office for new concerns or questions Patient Goals: In the next 90 days, patient will attend all scheduled appointments. - Follow Up Plan: The  patient has been provided with contact information for the care management team and has been advised to call with any health related questions or concerns.  The care management team will reach out to the patient again over the next 30 days.  Evidence-based guidance:   Review biopsychosocial determinants of health screens.  Review need for preventive screening based on age, sex, family history and health history.  Determine level of modifiable health risk.   Discuss identified risks.  Identify areas where behavior change may lead to improved health.  Promote healthy lifestyle.  Evoke change talk using open-ended questions, pros and cons, as well as looking forward.  Identify and manage conditions or preconditions to reduce health risk.  Implement additional goals and interventions based on identified risk factors.     Follow Up:  Patient agrees to Care Plan and Follow-up.  Plan: The Managed Medicaid care management team will reach out to the patient again over the next 30 days.  Date/time of next scheduled Munoz care management/care coordination outreach:  06/26/21 at 330.

## 2021-06-05 DIAGNOSIS — Z79891 Long term (current) use of opiate analgesic: Secondary | ICD-10-CM | POA: Diagnosis not present

## 2021-06-05 DIAGNOSIS — M25511 Pain in right shoulder: Secondary | ICD-10-CM | POA: Diagnosis not present

## 2021-06-05 DIAGNOSIS — M5136 Other intervertebral disc degeneration, lumbar region: Secondary | ICD-10-CM | POA: Diagnosis not present

## 2021-06-05 DIAGNOSIS — M79622 Pain in left upper arm: Secondary | ICD-10-CM | POA: Diagnosis not present

## 2021-06-05 DIAGNOSIS — M542 Cervicalgia: Secondary | ICD-10-CM | POA: Diagnosis not present

## 2021-06-05 DIAGNOSIS — M25512 Pain in left shoulder: Secondary | ICD-10-CM | POA: Diagnosis not present

## 2021-06-05 DIAGNOSIS — G894 Chronic pain syndrome: Secondary | ICD-10-CM | POA: Diagnosis not present

## 2021-06-05 DIAGNOSIS — M545 Low back pain, unspecified: Secondary | ICD-10-CM | POA: Diagnosis not present

## 2021-06-05 DIAGNOSIS — M79621 Pain in right upper arm: Secondary | ICD-10-CM | POA: Diagnosis not present

## 2021-06-05 DIAGNOSIS — M79661 Pain in right lower leg: Secondary | ICD-10-CM | POA: Diagnosis not present

## 2021-06-05 DIAGNOSIS — M79662 Pain in left lower leg: Secondary | ICD-10-CM | POA: Diagnosis not present

## 2021-06-07 DIAGNOSIS — Z6832 Body mass index (BMI) 32.0-32.9, adult: Secondary | ICD-10-CM | POA: Insufficient documentation

## 2021-06-12 ENCOUNTER — Other Ambulatory Visit: Payer: Self-pay | Admitting: Licensed Clinical Social Worker

## 2021-06-12 NOTE — Patient Outreach (Signed)
Medicaid Managed Care Social Work Note  06/12/2021 Name:  Sara Munoz MRN:  [id number] DOB:  [birth year]  BREENA BEVACQUA is an 55 y.o. year old female who is a primary patient of [name], [name] (Inactive).  The Medicaid Managed Care Coordination team was consulted for assistance with:  [place] and Resources  Ms. Plourde was given information about Medicaid Managed Care Coordination team services today. [name] Patient agreed to services and verbal consent obtained.  Engaged with patient  for by telephone forfollow up visit in response to referral for case management and/or care coordination services.   Assessments/Interventions:  Review of past medical history, allergies, medications, health status, including review of consultants reports, laboratory and other test data, was performed as part of comprehensive evaluation and provision of chronic care management services.  SDOH: (Social Determinant of Health) assessments and interventions performed: SDOH Interventions    Flowsheet Row Most Recent Value  SDOH Interventions   Stress Interventions Provide Counseling, Offered [employer] Resources       Advanced Directives Status:  See Care Plan for related entries.  Care Plan                 Allergies  Allergen Reactions   Flexeril [Cyclobenzaprine] Hives and Swelling    Facial/lip swelling      Levofloxacin Hives and Swelling   Phenergan [Promethazine Hcl] Other (See Comments)    Agitation.    Toradol [Ketorolac Tromethamine] Swelling and Other (See Comments)    Facial/tongue swelling    Tramadol Hives, Swelling and Other (See Comments)    Lip swelling    Zoloft [Sertraline Hcl] Swelling    Tongue swelling       Medications Reviewed Today     Reviewed by [name], RN (Registered Nurse) on 06/01/21 at [place] List Status: <None>   Medication Order Taking? Sig Documenting Provider Last Dose Status Informant  albuterol  (PROAIR HFA) 108 (90 Base) MCG/ACT inhaler [id number]  INHALE TWO PUFFS BY MOUTH INTO LUNGS EVERY 6 HOURS AS NEEDED FOR SHORTNESS OF BREATH OR wheezing Jerrol Banana., MD  Active Self  albuterol (PROVENTIL) (2.5 MG/3ML) 0.083% nebulizer solution [id number]  Take 3 mLs (2.5 mg total) by nebulization every 6 (six) hours as needed for wheezing or shortness of breath. [name], MD  Active Self  amLODipine (NORVASC) 5 MG tablet [id number]  Take 1 tablet (5 mg total) by mouth daily. Jerrol Banana., MD  Active Self  atorvastatin (LIPITOR) 20 MG tablet [id number]  Take 1 tablet (20 mg total) by mouth every evening. Jerrol Banana., MD  Active Self  budesonide-formoterol [facility]) 160-4.5 MCG/ACT inhaler [id number]  Inhale 2 puffs into the lungs in the morning and at bedtime. Jerrol Banana., MD  Active Self  buprenorphine-naloxone (SUBOXONE) 8-2 mg SUBL SL tablet [id number] No Place 1 tablet under the tongue in the morning and at bedtime.  Patient not taking: Reported on 06/01/2021   [provider] Not Taking Active Self           Med Note Ivor Reining May 03, 2021 11:27 AM) Per patient md is planning to switch patient on 05/08/21 to oxycodone  docusate sodium (COLACE) 100 MG capsule [id number]  Take 100 mg by mouth 2 (two) times daily. [provider]  Active Self  DULoxetine (CYMBALTA) 30 MG capsule [id number]  Take 30 mg by mouth 2 (two) times  daily. [provider]  Active Self  DULoxetine (CYMBALTA) 60 MG capsule [id number]  Take 1 capsule (60 mg total) by mouth daily. Please call [phone] to schedule appt or may request future refills from PCP.  Patient not taking: No sig reported   [name], MD  Active Self  gabapentin (NEURONTIN) 600 MG tablet [id number]  Take 600 mg by mouth 4 (four) times daily. [provider]  Active Self  hydrOXYzine (ATARAX/VISTARIL) 50 MG tablet [id number]  Take 1 tablet (50 mg total) by mouth 3  (three) times daily as needed.  Patient taking differently: Take 50 mg by mouth at bedtime.   Sable Feil, [name]  Active Self  linaclotide Rolan Lipa) 290 MCG CAPS capsule [id number]  Take 290 mcg by mouth daily before breakfast. [provider]  Active Self  lisinopril (ZESTRIL) 40 MG tablet [id number]  Take 1 tablet (40 mg total) by mouth every evening. Jerrol Banana., MD  Active Self  methocarbamol (ROBAXIN) 500 MG tablet [id number]  Take 1 tablet (500 mg total) by mouth 4 (four) times daily. Meyran, Ocie Cornfield, NP  Active   metoprolol tartrate (LOPRESSOR) 25 MG tablet [id number]  Take 1 tablet (25 mg total) by mouth 2 (two) times daily. Jerrol Banana., MD  Active Self  naloxone [facility]) 4 MG/0.1ML LIQD nasal spray kit [id number]  Place 1 spray into the nose as needed (opioid overdose). [provider]  Active Self           Med Note Ivor Reining May 03, 2021 11:23 AM)    Naloxone HCl [facility]) [id number]  Inject 1 Dose as directed as needed (opioid overdose). [provider]  Active Self           Med Note Ivor Reining May 03, 2021 11:32 AM)    naproxen (NAPROSYN) 500 MG tablet [id number]  Take 500 mg by mouth 2 (two) times daily. [provider]  Active   nitroGLYCERIN (NITROSTAT) 0.4 MG SL tablet [id number]  Place 1 tablet (0.4 mg total) under the tongue every 5 (five) minutes x 3 doses as needed for chest pain. [name], [name], [name]  Active Self  omeprazole (PRILOSEC) 40 MG capsule [id number]  Take 1 capsule (40 mg total) by mouth 2 (two) times daily.  Patient not taking: Reported on 05/03/2021   [name], MD  Expired 02/11/21 2359   omeprazole (PRILOSEC) 40 MG capsule [id number]  TAKE 1 CAPSULE BY MOUTH TWICE A DAY [name], MD  Active Self  ondansetron (ZOFRAN-ODT) 8 MG disintegrating tablet [id number]  Take 8 mg by mouth 2 (two) times daily as needed. [provider]  Active   Oxycodone HCl 10 MG  TABS [id number]  Take 10 mg by mouth every 4 (four) hours as needed. [provider]  Active   Plecanatide (TRULANCE) 3 MG TABS [id number] Yes Take 1 tablet by mouth daily. [name], MD Taking Active Self           Med Note Ivor Reining May 03, 2021 11:29 AM) Has not started yet, will finish linzess first   [place] 2.5 MCG/ACT AERS [id number]  INHALE 1 PUFF BY MOUTH INTO LUNGS ONCE DAILY Bacigalupo, Dionne Bucy, MD  Active Self  sucralfate (CARAFATE) 1 GM/10ML suspension [id number]  TAKE 10 MLS (1 G TOTAL) BY MOUTH 4 (FOUR) TIMES DAILY. [name], MD  Active Self  Patient Active Problem List   Diagnosis Date Noted   Pseudoarthrosis of cervical spine ([place]) 05/14/2021   Lumbar radiculopathy 05/23/2020   Sacroiliac inflammation ([place]) 11/25/2019   Sepsis ([place]) 10/19/2018   Severe sepsis ([place]) 10/19/2018   Cervical radiculopathy 02/12/2018   Chronic migraine 02/12/2018   Paresthesia 08/29/2017   Neck pain 08/29/2017   Daily headache 08/29/2017   Chronic active hepatitis ([place]) 07/29/2017   Neurogenic pain 07/31/2016   Chronic pain syndrome 06/05/2016   Carotid artery stenosis 02/27/2016   Chronic constipation 02/27/2016   Chronic nausea 02/27/2016   Hypercholesterolemia 02/27/2016   Osteoarthritis 02/27/2016   PTSD (post-traumatic stress disorder) 02/27/2016   TIA (transient ischemic attack) 02/27/2016   Long term current use of opiate analgesic 02/27/2016   Long term prescription opiate use 02/27/2016   Encounter for pain management consult 02/27/2016   Chronic hip pain (Location of Tertiary source of pain) (Bilateral) (L>R) 02/27/2016   Chronic knee pain (Bilateral) (L>R) 02/27/2016   Chronic shoulder pain (Bilateral) (L>R) 02/27/2016   Chronic sacroiliac joint pain (Bilateral) (L>R) 02/27/2016   Chronic low back pain (Location of Primary Source of Pain) (Bilateral) (L>R) 02/27/2016   Chronic lower extremity pain (Location of Secondary  source of pain) (Bilateral) (L>R) 02/27/2016   Osteoarthritis of hip (Bilateral) (L>R) 02/27/2016   Chronic neck pain (posterior midline) (Bilateral) (L>R) 02/27/2016   Cervicogenic headache (Bilateral) (L>R) 02/27/2016   Occipital headache (Bilateral) (L>R) 02/27/2016   Chronic upper extremity pain (Bilateral) (L>R) 02/27/2016   Chronic Cervical radicular pain (Bilateral) (L>R) 02/27/2016   Chronic lumbar radicular pain (Right) (L5 dermatome) 02/27/2016   Lumbar facet syndrome (Bilateral) (L>R) 02/27/2016   Long term prescription benzodiazepine use 02/27/2016   Hypertension    Chronic obstructive pulmonary disease ([place]) 10/09/2015   GERD (gastroesophageal reflux disease) 10/09/2015   Non-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/2014 08/02/2015   Hyperlipidemia 08/02/2015   Costochondritis 08/02/2015   Drug abuse, 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[place]) 09/03/2014   GAD (generalized anxiety disorder) 09/03/2014   Narcotic dependence ([place]) 09/03/2014   PVD (peripheral vascular disease) ([place]) 09/03/2014   Smoker 09/03/2014   Cigarette nicotine dependence with nicotine-induced disorder 07/08/2014   Anemia of chronic disease 03/19/2014   Narcotic abuse, continuous ([place]) 03/19/2014   Polysubstance abuse ([place]) 03/19/2014   MSSA (methicillin susceptible Staphylococcus aureus) septicemia ([place]) 03/07/2014   Hypomagnesemia 01/04/2014   Chronic cough 09/04/2011   Sleep apnea, obstructive 09/04/2011   Sinusitis, acute 09/04/2011    Conditions to be addressed/monitored per PCP order:  Anxiety and Depression  Care Plan : LCSW Plan of Care  Updates made by Greg Cutter, LCSW since 06/12/2021 12:00 AM     Problem: Anxiety Identification (Anxiety)      Long-Range Goal: Anxiety Symptoms Identified   Start Date: 01/04/2021  Recent Progress: On track  Priority: High  Note:   Timeframe:  Long-Range Goal Priority:  High Start Date:    01/04/21                         Expected End Date:   ongoing                    Follow Up  Date 07/06/21  Current barriers:   Chronic Mental Health needs related to anxiety, stress and depression Limited social support, Mental Health Concerns , and Social Isolation Needs Support, Education, and Care Coordination in order to meet unmet mental health needs. Clinical Goal(s): patient will work with SW  to address concerns related to increasing coping skills to help managed mood patient will work with [place] to address needs related to finding a mental health psychiatrist    Clinical Interventions:  Assessed patient's previous and current treatment, coping skills, support system and barriers to care  Patient reports that she is in need of only medication management. She moved from [facility] to [facility]. Patient reports that she already has a Social worker in [place] at [employer] that she has built Museum/gallery exhibitions officer with. Patient is agreeable to [place] referral in order to find a psychiatrist for medication management. Referral placed on 01/04/21. Patient has her first initial appointment with a psychiatric nurse practitioner on 02/02/21 at 1:00 pm. This appointment is virtual and she has already received the link to join tele health session for 02/02/21. Update- 02/19/21- Patient reports that she successfully attended her virtual psychiatry visit with her matched psychiatric PA and was placed on a new medication. She reports liking her matched provider and has a follow up appointment with him next month. She was provided [facility] customer service number if she encounters any problems with this upcoming virtual visit. Update 04/20/21 has had made great progress with mental health treatment through [facility]. Their update by provider assigned to patient sstated Regression-None Change Minimal Progress-Some Progress (Progress since beginning of treatment). Patient by Berniece Pap on 04/13/21. Patient reports that she has built great rapport with provider. Update- Patient  has her next appointment with her psychiatrist on 05/31/21. These sessions remain virtual but her counseling sessions are in person. Patient was questioned about how her counseling sessions were going and she stated that she needs to contact them and schedule an appointment. Patient reports that her surgery on 05/14/21 was successful but she is experiencing pain which is affecting her sleep. Patient has her pain management appointment on 06/05/21 and her follow up surgery appointment on 06/07/21. Patient's significant other asked off of work for 2 weeks to assist her with her recovery.  Update 06/12/21- Patient is still seeing her psychiatrist monthly. She's sees her psychiatrist virtually. Patient reports that her significant other is back to work but her healing and recovery process is going well. She reports she she has started taking of her neck brace some as her surgeon advised. Patient reports that she has not had talk therapy in awhile and will contact her counselor as [employer] today to reschedule an appointment. Patient reports that she is managing her mental health well at this time. Patient reports implementing appropriate self-care habits into her daily routine such as: drinking water, staying active around the house, taking her medications as prescribed, combating negative thoughts or emotions and staying connected with her family and friends. Positive reinforcement provided.  LCSW provided education on relaxation techniques such as meditation, deep breathing, massage, grounding exercsies or yoga that can activate the body's relaxation response and ease symptoms of stress and anxiety. LCSW ask that when pt is struggling with difficult emotions and racing thoughts that they start this relaxation response process. LCSW provided extensive education on healthy coping skills for anxiety. SW used active and reflective listening, validated patient's feelings/concerns, and provided emotional  support.    24- Hour Availability:    [facility]  [address] [place], [place] [facility] [phone]   [facility] [employer] [place]  [phone] (after hours)  Therapeutic Alternative/Mobile Crisis   [phone]   [place] National Suicide Hotline  567-649-6328 Diamantina Monks) OR 762-200-0504   Call 911 or go to emergency room   [facility]  [phone]);  [place] and [employer]  [phone]); [place], [place], [place], [place], [place], [place], [place]        Review various resources, discussed options and provided patient information about Discussed several options for long term counseling based on need and insurance Depression screen reviewed , Solution-Focused Strategies, Mindfulness or Relaxation Training, Active listening / Reflection utilized , Emotional Supportive Provided, Brief CBT , and Suicidal Ideation/Homicidal Ideation assessed: ; Patient interviewed and appropriate assessments performed Discussed plans with patient for ongoing care management follow up and provided patient with direct contact information for care management team Assisted patient/caregiver with obtaining information about health plan benefits Provided education and assistance to client regarding Advanced Directives. Discussed several options for long term counseling based on need and insurance.  Inter-disciplinary care team collaboration (see longitudinal plan of care) Patient reports that she is still having issues with insomnia and is not sleeping well at night.  LCSW provided education on healthy sleep hygiene and what that looks like. LCSW encouraged patient to implement a night time routine into her schedule that works best for her and that she is able to maintain. Advised patient to implement deep  breathing/grounding/meditation/self-care exercises into her nightly routine to combat racing thoughts at night. LCSW encouraged patient to wake up at the same time each day, make her sleeping environment comfortable, exercise when able, to limit naps and to not eat or drink anything right before bed. 05/09/21 Update- Patient reports that she is doing well but experiencing chronic back pain. She has a spine surgery scheduled for 05/14/21. Patient reports that she no longer goes to counseling weekly at [facility] due to her improvement in her mental health. She reports going once per month now. Patient's next appointment with her psychiatrist that [facility] matched her with is on 05/25/21. Patient reports that she is taking her cymbalta and hydroxyzine as prescribed and she feels that these medications are effectively working for her. Patient shares that her main support is "My boyfriend Jeneen Rinks and God." Patient has a neurologist and GI appointment on 05/21/21 and has stable transportation to these appointments through her insurance.   Patient Goals/Self-Care Activities: Over the next 120 days - barriers to treatment adherence identified - complementary therapy use encouraged - counseling provided - depression screen reviewed - self-awareness of emotional triggers encouraged - strategies to manage emotional triggers promoted - avoid negative self-talk - develop a personal safety plan - develop a plan to deal with triggers like holidays, anniversaries - exercise at least 2 to 3 times per week - have a plan for how to handle bad days - journal feelings and what helps to feel better or worse - spend time or talk with others at least 2 to 3 times per week - spend time or talk with others every day - watch for early signs of feeling worse - write in journal every day - begin personal counseling - call and visit an old friend - check out volunteer opportunities - join a support group -  laugh; watch a funny movie or comedian - learn and use visualization or guided imagery - perform a random act of kindness - practice relaxation or meditation daily - start or continue a personal journal - talk about feelings with a friend, family or spiritual advisor -  practice positive thinking and self-talk        Follow up:  Patient agrees to Care Plan and Follow-up.  Plan: The Managed Medicaid care management team will reach out to the patient again over the next 30 days.  Date of next scheduled Social Work care management/care coordination outreach:  07/06/21  [name], BSW, MSW, LCSW Managed Medicaid LCSW [email or web address] Phone: [phone]

## 2021-06-12 NOTE — Patient Instructions (Signed)
Visit Information  Ms. Dixson was given information about Medicaid Managed Care team care coordination services as a part of their [place] Complete Medicaid benefit. [name] verbally consented to engagement with the [facility] Managed Care team.   If you are experiencing a medical emergency, please call 911 or report to your local emergency department or urgent care.   If you have a non-emergency medical problem during routine business hours, please contact your provider's office and ask to speak with a nurse.   For questions related to your [place] Complete Medicaid health plan, please call: [phone]  If you would like to schedule transportation through your [place] Complete Medicaid plan, please call the following number at least 2 days in advance of your appointment: [phone].   Call the [place] at [phone], at any time, 24 hours a day, 7 days a week. If you are in danger or need immediate medical attention call 911.  If you would like help to quit smoking, call 1-800-QUIT-NOW (205)487-8949) OR Espaol: 1-855-Djelo-Ya (8-099-833-8250) o para ms informacin haga clic aqu or Text READY to 200-400 to register via text  Following is a copy of your plan of care:  Care Plan : LCSW Plan of Care  Updates made by Greg Cutter, LCSW since 06/12/2021 12:00 AM     Problem: Anxiety Identification (Anxiety)      Long-Range Goal: Anxiety Symptoms Identified   Start Date: 01/04/2021  Recent Progress: On track  Priority: High  Note:   Timeframe:  Long-Range Goal Priority:  High Start Date:    01/04/21                         Expected End Date:   ongoing                    Follow Up Date 07/06/21  Current barriers:   Chronic Mental Health needs related to anxiety, stress and depression Limited social support, Mental Health Concerns , and Social Isolation Needs Support, Education, and Care Coordination in order to meet unmet mental health  needs. Clinical Goal(s): patient will work with SW to address concerns related to increasing coping skills to help managed mood  Patient Goals/Self-Care Activities: Over the next 120 days - barriers to treatment adherence identified - complementary therapy use encouraged - counseling provided - depression screen reviewed - self-awareness of emotional triggers encouraged - strategies to manage emotional triggers promoted - avoid negative self-talk - develop a personal safety plan - develop a plan to deal with triggers like holidays, anniversaries - exercise at least 2 to 3 times per week - have a plan for how to handle bad days - journal feelings and what helps to feel better or worse - spend time or talk with others at least 2 to 3 times per week - spend time or talk with others every day - watch for early signs of feeling worse - write in journal every day - begin personal counseling - call and visit an old friend - check out volunteer opportunities - join a support group - laugh; watch a funny movie or comedian - learn and use visualization or guided imagery - perform a random act of kindness - practice relaxation or meditation daily - start or continue a personal journal - talk about feelings with a friend, family or spiritual advisor - practice positive thinking and self-talk  [name], BSW, MSW, [employer] Managed Medicaid [place]  [email or web address] Phone: [phone]

## 2021-06-20 ENCOUNTER — Ambulatory Visit: Payer: Medicaid Other | Admitting: Gastroenterology

## 2021-06-26 ENCOUNTER — Other Ambulatory Visit: Payer: Self-pay

## 2021-06-26 ENCOUNTER — Other Ambulatory Visit: Payer: Self-pay | Admitting: Obstetrics and Gynecology

## 2021-06-26 NOTE — Patient Instructions (Signed)
Hi Sara Munoz, thank you for speaking to me today-have a great rest of the day!!  Sara Munoz was given information about Medicaid Managed Care team care coordination services as a part of their [place] Complete Medicaid benefit. Sara Munoz verbally consented to engagement with the [facility] Managed Care team.   If you are experiencing a medical emergency, please call 911 or report to your local emergency department or urgent care.   If you have a non-emergency medical problem during routine business hours, please contact your provider's office and ask to speak with a nurse.   For questions related to your [place] Complete Medicaid health plan, please call: [phone]  If you would like to schedule transportation through your [place] Complete Medicaid plan, please call the following number at least 2 days in advance of your appointment: [phone].   Call the [facility] at [phone], at any time, 24 hours a day, 7 days a week. If you are in danger or need immediate medical attention call 911.  If you would like help to quit smoking, call 1-800-QUIT-NOW 605-054-2572) OR Espaol: 1-855-Djelo-Ya (2-725-366-4403) o para ms informacin haga clic aqu or Text READY to 200-400 to register via text  [name] - following are the goals we discussed in your visit today:   Timeframe:  Long-Range Goal Priority:  High Start Date:       12/20/20                      Expected End Date:    ongoing                   Follow Up Date: 07/27/21   - schedule appointment for flu shot - schedule appointment for vaccines needed due to my age or health - schedule recommended health tests (blood work, mammogram, colonoscopy, pap test) - schedule and keep appointment for annual check-up   06/26/21:  patient has upcoming appointments with neurologist, GI, pain management, surgeon, and Psychiatrist   Why is this important?   Screening tests can find diseases early when they are  easier to treat.  Your doctor or nurse will talk with you about which tests are important for you.  Getting shots for common diseases like the flu and shingles will help prevent them.     The patient verbalized understanding of instructions provided today and declined a print copy of patient instruction materials.   The Managed Medicaid care management team will reach out to the patient again over the next 30 days.  The  Patient  has been provided with contact information for the Managed Medicaid care management team and has been advised to call with any health related questions or concerns.   Sara Raider RN, BSN [place]   [facility] - Managed Medicaid High Risk [phone].   Following is a copy of your plan of care:  Care Plan : General Plan of Care (Adult)  Updates made by [name], RN since 06/26/2021 12:00 AM     Problem: Health Promotion or Disease Self-Management (General Plan of Care)   Priority: High  Onset Date: 12/20/2020     Long-Range Goal: Self-Management Plan Developed   Start Date: 12/20/2020  Expected End Date: 08/30/2021  Recent Progress: Not on track  Priority: High  Note:    Current Barriers:  Knowledge Deficits related to short term plan for care coordination needs and long term plans for chronic disease management  needs.  Patient with many chronic health conditions-CAD, HTN, PVD, TIA, migraines, COPD, OSA, GERD, hepatitis, tobacco use, osteoarthritis, chronic pain, anxiety 06/26/21:  Patient complaining of neck pain s/p surgery-has upcoming appointments with surgeon and pain management, continues to smoke 1/2 ppd Nurse Case Manager Clinical Goal(s):  patient will work with care management team to address care coordination and chronic disease management needs related to Disease Management Educational Needs Care Coordination Medication Management and Education Medication Reconciliation Medication Assistance   Psychosocial Support Mental Health Counseling   Interventions:  Evaluation of current treatment plan  and patient's adherence to plan as established by provider. Reviewed medications with patient. Collaborated with pharmacy regarding medications.Marland Kitchen  Collaborated with SW regarding anxiety. Discussed plans with patient for ongoing care management follow up and provided patient with direct contact information for care management team Advised patient, providing education and rationale, to monitor blood pressure daily and record, calling PCP for findings outside established parameters.  Reviewed scheduled/upcoming provider appointments. Social Work referral for anxiety, PTSD-completed Pharmacy referral for medication review-completed. Patient given 1-800-QUITNOW for smoking cessation if interested.  Self Care Activities:  Patient will self administer medications as prescribed Patient will attend all scheduled provider appointments Patient will call pharmacy for medication refills Patient will continue to perform ADL's independently Patient will call provider office for new concerns or questions Patient Goals: In the next 90 days, patient will attend all scheduled appointments. - Follow Up Plan: The patient has been provided with contact information for the care management team and has been advised to call with any health related questions or concerns.  The care management team will reach out to the patient again over the next 30 days.  Evidence-based guidance:   Review biopsychosocial determinants of health screens.  Review need for preventive screening based on age, sex, family history and health history.  Determine level of modifiable health risk.   Discuss identified risks.  Identify areas where behavior change may lead to improved health.  Promote healthy lifestyle.  Evoke change talk using open-ended questions, pros and cons, as well as looking forward.  Identify and manage  conditions or preconditions to reduce health risk.  Implement additional goals and interventions based on identified risk factors.

## 2021-06-26 NOTE — Patient Outreach (Signed)
Medicaid Managed Care   Nurse Care Manager Note  06/26/2021 Name:  Sara Munoz MRN:  [id number] DOB:  [birth year]  Sara Munoz is an 56 y.o. year old female who is a primary patient of [name], [name] (Inactive).  The [facility] Managed Care Coordination team was consulted for assistance with:    Chronic healthcare management needs.  Sara Munoz was given information about Medicaid Managed Care Coordination team services today. Sara Munoz Patient agreed to services and verbal consent obtained.  Engaged with patient by telephone for follow up visit in response to provider referral for case management and/or care coordination services.   Assessments/Interventions:  Review of past medical history, allergies, medications, health status, including review of consultants reports, laboratory and other test data, was performed as part of comprehensive evaluation and provision of chronic care management services.  SDOH (Social Determinants of Health) assessments and interventions performed: SDOH Interventions    Flowsheet Row Most Recent Value  SDOH Interventions   Housing Interventions Intervention Not Indicated  Social Connections Interventions Intervention Not Indicated       Care Plan  Allergies  Allergen Reactions   Flexeril [Cyclobenzaprine] Hives and Swelling    Facial/lip swelling      Levofloxacin Hives and Swelling   Phenergan [Promethazine Hcl] Other (See Comments)    Agitation.    Toradol [Ketorolac Tromethamine] Swelling and Other (See Comments)    Facial/tongue swelling    Tramadol Hives, Swelling and Other (See Comments)    Lip swelling    Zoloft [Sertraline Hcl] Swelling    Tongue swelling       Medications Reviewed Today     Reviewed by Sara Medicus, RN (Registered Nurse) on 06/26/21 at 1531  Med List Status: <None>   Medication Order Taking? Sig Documenting Provider Last Dose Status Informant  albuterol (PROAIR HFA) 108 (90 Base) MCG/ACT inhaler  [id number] No INHALE TWO PUFFS BY MOUTH INTO LUNGS EVERY 6 HOURS AS NEEDED FOR SHORTNESS OF BREATH OR wheezing Jerrol Banana., MD 05/14/2021 0900 Active Self  albuterol (PROVENTIL) (2.5 MG/3ML) 0.083% nebulizer solution [id number] No Take 3 mLs (2.5 mg total) by nebulization every 6 (six) hours as needed for wheezing or shortness of breath. [name], MD 05/13/2021 Active Self  amLODipine (NORVASC) 5 MG tablet [id number] No Take 1 tablet (5 mg total) by mouth daily. Jerrol Banana., MD 05/13/2021 Active Self  atorvastatin (LIPITOR) 20 MG tablet [id number] No Take 1 tablet (20 mg total) by mouth every evening. Jerrol Banana., MD 05/13/2021 Active Self  budesonide-formoterol (SYMBICORT) 160-4.5 MCG/ACT inhaler [id number] No Inhale 2 puffs into the lungs in the morning and at bedtime. Jerrol Banana., MD 05/14/2021 0600 Active Self  buprenorphine-naloxone (SUBOXONE) 8-2 mg SUBL SL tablet [id number] No Place 1 tablet under the tongue in the morning and at bedtime.  Patient not taking: Reported on 06/01/2021   [provider] Not Taking Active Self           Med Note (Sara Munoz, Lorel Monaco   Tue Jun 26, 2021  3:23 PM)    docusate sodium (COLACE) 100 MG capsule [id number] No Take 100 mg by mouth 2 (two) times daily. [provider] 05/13/2021 Active Self  DULoxetine (CYMBALTA) 30 MG capsule [id number] No Take 30 mg by mouth 2 (two) times daily. [provider] 05/13/2021 Active Self  DULoxetine (CYMBALTA) 60 MG capsule [id number] No Take 1 capsule (60 mg total) by mouth daily.  call [phone] to schedule appt or may request future refills from PCP.  °Patient not taking: No sig reported  ° Yan, Yijun, MD Not Taking Active Self  °gabapentin (NEURONTIN) 600 MG tablet [id number] No Take 600 mg by mouth 4 (four) times daily. [provider] 05/14/2021 0600 Active Self  °hydrOXYzine (ATARAX/VISTARIL) 50 MG tablet [id number] No Take 1 tablet (50 mg total) by  mouth 3 (three) times daily as needed.  °Patient taking differently: Take 50 mg by mouth at bedtime.  ° Smith, Ronald K, [name] 05/13/2021 Active Self  °linaclotide (LINZESS) 290 MCG CAPS capsule [id number] No Take 290 mcg by mouth daily before breakfast. [provider] 05/13/2021 Active Self  °lisinopril (ZESTRIL) 40 MG tablet [id number] No Take 1 tablet (40 mg total) by mouth every evening. Gilbert, Richard L Jr., MD 05/13/2021 Active Self  °methocarbamol (ROBAXIN) 500 MG tablet [id number]  Take 1 tablet (500 mg total) by mouth 4 (four) times daily. Meyran, Kimberly Hannah, NP  Active   °metoprolol tartrate (LOPRESSOR) 25 MG tablet [id number] No Take 1 tablet (25 mg total) by mouth 2 (two) times daily. Gilbert, Richard L Jr., MD 05/14/2021 0600 Active Self  °naloxone (NARCAN) 4 MG/0.1ML LIQD nasal spray kit [id number] No Place 1 spray into the nose as needed (opioid overdose). [provider] Taking Active Self  °         °Med Note (LAWSON, BROOKE C   Thu May 03, 2021 11:23 AM)    °Naloxone HCl (NARCAN IJ) [id number] No Inject 1 Dose as directed as needed (opioid overdose). [provider] Taking Active Self  °         °Med Note (LAWSON, BROOKE C   Thu May 03, 2021 11:32 AM)    °naproxen (NAPROSYN) 500 MG tablet [id number]  Take 500 mg by mouth 2 (two) times daily. [provider]  Active   °nitroGLYCERIN (NITROSTAT) 0.4 MG SL tablet [id number] No Place 1 tablet (0.4 mg total) under the tongue every 5 (five) minutes x 3 doses as needed for chest pain. [name], Dennis E, [name] More than a month Active Self  °omeprazole (PRILOSEC) 40 MG capsule [id number] No Take 1 capsule (40 mg total) by mouth 2 (two) times daily.  °Patient not taking: Reported on 05/03/2021  ° Anna, Kiran, MD Not Taking Expired 02/11/21 2359   °omeprazole (PRILOSEC) 40 MG capsule [id number] No TAKE 1 CAPSULE BY MOUTH TWICE A DAY Anna, Kiran, MD 05/14/2021 0600 Active Self  °ondansetron (ZOFRAN-ODT) 8 MG disintegrating  tablet [id number]  Take 8 mg by mouth 2 (two) times daily as needed. [provider]  Active   °Oxycodone HCl 10 MG TABS [id number]  Take 15 mg by mouth every 4 (four) hours as needed. [provider]  Active   °Plecanatide (TRULANCE) 3 MG TABS [id number] No Take 1 tablet by mouth daily. Anna, Kiran, MD Taking Active Self  °         °Med Note (LAWSON, BROOKE C   Thu May 03, 2021 11:29 AM) Has not started yet, will finish linzess first   °SPIRIVA RESPIMAT 2.5 MCG/ACT AERS [id number] No INHALE 1 PUFF BY MOUTH INTO LUNGS ONCE DAILY Bacigalupo, Angela M, MD 05/13/2021 Active Self  °sucralfate (CARAFATE) 1 GM/10ML suspension [id number] No TAKE 10 MLS (1 G TOTAL) BY MOUTH 4 (FOUR) TIMES DAILY. Anna, Kiran, MD 05/13/2021 Active Self  ° °  °  ° °  ° ° °Patient Active Problem List  ° Diagnosis Date Noted  °   Pseudoarthrosis of cervical spine (HCC) 05/14/2021  ° Lumbar radiculopathy 05/23/2020  ° Sacroiliac inflammation (HCC) 11/25/2019  ° Sepsis (HCC) 10/19/2018  ° Severe sepsis (HCC) 10/19/2018  ° Cervical radiculopathy 02/12/2018  ° Chronic migraine 02/12/2018  ° Paresthesia 08/29/2017  ° Neck pain 08/29/2017  ° Daily headache 08/29/2017  ° Chronic active hepatitis (HCC) 07/29/2017  ° Neurogenic pain 07/31/2016  ° Chronic pain syndrome 06/05/2016  ° Carotid artery stenosis 02/27/2016  ° Chronic constipation 02/27/2016  ° Chronic nausea 02/27/2016  ° Hypercholesterolemia 02/27/2016  ° Osteoarthritis 02/27/2016  ° PTSD (post-traumatic stress disorder) 02/27/2016  ° TIA (transient ischemic attack) 02/27/2016  ° Long term current use of opiate analgesic 02/27/2016  ° Long term prescription opiate use 02/27/2016  ° Encounter for pain management consult 02/27/2016  ° Chronic hip pain (Location of Tertiary source of pain) (Bilateral) (L>R) 02/27/2016  ° Chronic knee pain (Bilateral) (L>R) 02/27/2016  ° Chronic shoulder pain (Bilateral) (L>R) 02/27/2016  ° Chronic sacroiliac joint pain (Bilateral) (L>R) 02/27/2016  °  Chronic low back pain (Location of Primary Source of Pain) (Bilateral) (L>R) 02/27/2016  ° Chronic lower extremity pain (Location of Secondary source of pain) (Bilateral) (L>R) 02/27/2016  ° Osteoarthritis of hip (Bilateral) (L>R) 02/27/2016  ° Chronic neck pain (posterior midline) (Bilateral) (L>R) 02/27/2016  ° Cervicogenic headache (Bilateral) (L>R) 02/27/2016  ° Occipital headache (Bilateral) (L>R) 02/27/2016  ° Chronic upper extremity pain (Bilateral) (L>R) 02/27/2016  ° Chronic Cervical radicular pain (Bilateral) (L>R) 02/27/2016  ° Chronic lumbar radicular pain (Right) (L5 dermatome) 02/27/2016  ° Lumbar facet syndrome (Bilateral) (L>R) 02/27/2016  ° Long term prescription benzodiazepine use 02/27/2016  ° Hypertension   ° Chronic obstructive pulmonary disease (HCC) 10/09/2015  ° GERD (gastroesophageal reflux disease) 10/09/2015  ° Non-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/2014 08/02/2015  ° Hyperlipidemia 08/02/2015  ° Costochondritis 08/02/2015  ° Drug abuse, 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  ° GAD (generalized anxiety disorder) 09/03/2014  ° Narcotic dependence (HCC) 09/03/2014  ° PVD (peripheral vascular disease) (HCC) 09/03/2014  ° Smoker 09/03/2014  ° Cigarette nicotine dependence with nicotine-induced disorder 07/08/2014  ° Anemia of chronic disease 03/19/2014  ° Narcotic abuse, continuous (HCC) 03/19/2014  ° Polysubstance abuse (HCC) 03/19/2014  ° MSSA (methicillin susceptible Staphylococcus aureus) septicemia (HCC) 03/07/2014  ° Hypomagnesemia 01/04/2014  ° Chronic cough 09/04/2011  ° Sleep apnea, obstructive 09/04/2011  ° Sinusitis, acute 09/04/2011  ° ° °Conditions to be addressed/monitored per PCP order:   chronic healthcare management needs, HTN, chronic pain, tobacco use, GERD, migraines, COPD, OSA, anxiety, osteoarthritis ° °Care Plan : General Plan of Care (Adult)  °Updates made by Craft, Terri G, RN since 06/26/2021 12:00 AM  °  ° °Problem: Health Promotion or Disease Self-Management (General Plan of Care)    °Priority: High  °Onset Date: 12/20/2020  °  ° °Long-Range Goal: Self-Management Plan Developed   °Start Date: 12/20/2020  °Expected End Date: 08/30/2021  °Recent Progress: Not on track  °Priority: High  °Note:   ° ° °Current Barriers:  °Knowledge Deficits related to short term plan for care coordination needs and long term plans for chronic disease management needs.  Patient with many chronic health conditions-CAD, HTN, PVD, TIA, migraines, COPD, OSA, GERD, hepatitis, tobacco use, osteoarthritis, chronic pain, anxiety °06/26/21:  Patient complaining of neck pain s/p surgery-has upcoming appointments with surgeon and pain management, continues to smoke 1/2 ppd °Nurse Case Manager Clinical Goal(s):  °patient will work with care management team to address care coordination and chronic disease management needs related   to Disease Management °Educational Needs °Care Coordination °Medication Management and Education °Medication Reconciliation °Medication Assistance  °Psychosocial Support °Mental Health Counseling   °Interventions:  °Evaluation of current treatment plan  and patient's adherence to plan as established by provider. °Reviewed medications with patient. °Collaborated with pharmacy regarding medications.. ° Collaborated with SW regarding anxiety. °Discussed plans with patient for ongoing care management follow up and provided patient with direct contact information for care management team °Advised patient, providing education and rationale, to monitor blood pressure daily and record, calling PCP for findings outside established parameters.  °Reviewed scheduled/upcoming provider appointments. °Social Work referral for anxiety, PTSD-completed °Pharmacy referral for medication review-completed. °Patient given 1-800-QUITNOW for smoking cessation if interested. ° °Self Care Activities:  °Patient will self administer medications as prescribed °Patient will attend all scheduled provider appointments °Patient will call  pharmacy for medication refills °Patient will continue to perform ADL's independently °Patient will call provider office for new concerns or questions °Patient Goals: °In the next 90 days, patient will attend all scheduled appointments. °- °Follow Up Plan: The patient has been provided with contact information for the care management team and has been advised to call with any health related questions or concerns.  °The care management team will reach out to the patient again over the next 30 days.  Evidence-based guidance:  ° °Review biopsychosocial determinants of health screens.  °Review need for preventive screening based on age, sex, family history and health history.  °Determine level of modifiable health risk.   °Discuss identified risks.  °Identify areas where behavior change may lead to improved health.  °Promote healthy lifestyle.  °Evoke change talk using open-ended questions, pros and cons, as well as looking forward.  °Identify and manage conditions or preconditions to reduce health risk.  °Implement additional goals and interventions based on identified risk factors.  °  ° °Follow Up:  Patient agrees to Care Plan and Follow-up. ° °Plan: The Managed Medicaid care management team will reach out to the patient again over the next 30 days. and The  Patient has been provided with contact information for the Managed Medicaid care management team and has been advised to call with any health related questions or concerns. ° °Date/time of next scheduled RN care management/care coordination outreach:  07/27/21 at 330. °

## 2021-06-28 DIAGNOSIS — F411 Generalized anxiety disorder: Secondary | ICD-10-CM | POA: Diagnosis not present

## 2021-06-28 DIAGNOSIS — G4700 Insomnia, unspecified: Secondary | ICD-10-CM | POA: Diagnosis not present

## 2021-07-03 ENCOUNTER — Other Ambulatory Visit: Payer: Self-pay | Admitting: Gastroenterology

## 2021-07-03 DIAGNOSIS — M545 Low back pain, unspecified: Secondary | ICD-10-CM | POA: Diagnosis not present

## 2021-07-03 DIAGNOSIS — Z79891 Long term (current) use of opiate analgesic: Secondary | ICD-10-CM | POA: Diagnosis not present

## 2021-07-03 DIAGNOSIS — M542 Cervicalgia: Secondary | ICD-10-CM | POA: Diagnosis not present

## 2021-07-03 DIAGNOSIS — M25512 Pain in left shoulder: Secondary | ICD-10-CM | POA: Diagnosis not present

## 2021-07-03 DIAGNOSIS — M79662 Pain in left lower leg: Secondary | ICD-10-CM | POA: Diagnosis not present

## 2021-07-03 DIAGNOSIS — M79661 Pain in right lower leg: Secondary | ICD-10-CM | POA: Diagnosis not present

## 2021-07-03 DIAGNOSIS — G894 Chronic pain syndrome: Secondary | ICD-10-CM | POA: Diagnosis not present

## 2021-07-03 DIAGNOSIS — M25511 Pain in right shoulder: Secondary | ICD-10-CM | POA: Diagnosis not present

## 2021-07-03 DIAGNOSIS — M79621 Pain in right upper arm: Secondary | ICD-10-CM | POA: Diagnosis not present

## 2021-07-03 DIAGNOSIS — M79622 Pain in left upper arm: Secondary | ICD-10-CM | POA: Diagnosis not present

## 2021-07-06 ENCOUNTER — Other Ambulatory Visit: Payer: Self-pay

## 2021-07-06 NOTE — Patient Outreach (Signed)
Medicaid Managed Care Social Work Note  07/06/2021 Name:  Sara Munoz MRN:  [id number] DOB:  05-12-1966  Sara Munoz is an 56 y.o. year old female who is a primary patient of [name], [name] (Inactive).  The Medicaid Managed Care Coordination team was consulted for assistance with:  [employer]  Ms. Denard was given information about Medicaid Managed Care Coordination team services today. [name] Patient agreed to services and verbal consent obtained.  Engaged with patient  for by telephone forfollow up visit in response to referral for case management and/or care coordination services.   Assessments/Interventions:  Review of past medical history, allergies, medications, health status, including review of consultants reports, laboratory and other test data, was performed as part of comprehensive evaluation and provision of chronic care management services.  SDOH: (Social Determinant of Health) assessments and interventions performed: 07/06/21: Patient is continuing to see her therapist at [facility] in [place] and states she is doing well. No resources needed.    Advanced Directives Status:  Not addressed in this encounter.  Care Plan                 Allergies  Allergen Reactions   Flexeril [Cyclobenzaprine] Hives and Swelling    Facial/lip swelling      Levofloxacin Hives and Swelling   Phenergan [Promethazine Hcl] Other (See Comments)    Agitation.    Toradol [Ketorolac Tromethamine] Swelling and Other (See Comments)    Facial/tongue swelling    Tramadol Hives, Swelling and Other (See Comments)    Lip swelling    Zoloft [Sertraline Hcl] Swelling    Tongue swelling       Medications Reviewed Today     Reviewed by [name], RN (Registered Nurse) on 06/26/21 at 1531  Med List Status: <None>   Medication Order Taking? Sig Documenting Provider Last Dose Status Informant  albuterol (PROAIR HFA) 108 (90 Base) MCG/ACT  inhaler [id number] No INHALE TWO PUFFS BY MOUTH INTO LUNGS EVERY 6 HOURS AS NEEDED FOR SHORTNESS OF BREATH OR wheezing Jerrol Banana., MD 05/14/2021 0900 Active Self  albuterol (PROVENTIL) (2.5 MG/3ML) 0.083% nebulizer solution [id number] No Take 3 mLs (2.5 mg total) by nebulization every 6 (six) hours as needed for wheezing or shortness of breath. [name], MD 05/13/2021 Active Self  amLODipine (NORVASC) 5 MG tablet [id number] No Take 1 tablet (5 mg total) by mouth daily. Jerrol Banana., MD 05/13/2021 Active Self  atorvastatin (LIPITOR) 20 MG tablet [id number] No Take 1 tablet (20 mg total) by mouth every evening. Jerrol Banana., MD 05/13/2021 Active Self  budesonide-formoterol (SYMBICORT) 160-4.5 MCG/ACT inhaler [id number] No Inhale 2 puffs into the lungs in the morning and at bedtime. Jerrol Banana., MD 05/14/2021 0600 Active Self  buprenorphine-naloxone (SUBOXONE) 8-2 mg SUBL SL tablet [id number] No Place 1 tablet under the tongue in the morning and at bedtime.  Patient not taking: Reported on 06/01/2021   [provider] Not Taking Active Self           Med Note (CRAFT, Lorel Monaco   Tue Jun 26, 2021  3:23 PM)    docusate sodium (COLACE) 100 MG capsule [id number] No Take 100 mg by mouth 2 (two) times daily. [provider] 05/13/2021 Active Self  DULoxetine (CYMBALTA) 30 MG capsule [id number] No Take 30 mg by mouth 2 (two) times daily. [provider] 05/13/2021 Active Self  DULoxetine (CYMBALTA) 60 MG  capsule [id number] No Take 1 capsule (60 mg total) by mouth daily. Please call [phone] to schedule appt or may request future refills from PCP.  Patient not taking: No sig reported   [name], MD Not Taking Active Self  gabapentin (NEURONTIN) 600 MG tablet [id number] No Take 600 mg by mouth 4 (four) times daily. [provider] 05/14/2021 0600 Active Self  hydrOXYzine (ATARAX/VISTARIL) 50 MG tablet [id number] No Take 1 tablet (50 mg  total) by mouth 3 (three) times daily as needed.  Patient taking differently: Take 50 mg by mouth at bedtime.   Sable Feil, [name] 05/13/2021 Active Self  linaclotide (LINZESS) 290 MCG CAPS capsule [id number] No Take 290 mcg by mouth daily before breakfast. [provider] 05/13/2021 Active Self  lisinopril (ZESTRIL) 40 MG tablet [id number] No Take 1 tablet (40 mg total) by mouth every evening. Jerrol Banana., MD 05/13/2021 Active Self  methocarbamol (ROBAXIN) 500 MG tablet [id number]  Take 1 tablet (500 mg total) by mouth 4 (four) times daily. Meyran, Ocie Cornfield, NP  Active   metoprolol tartrate (LOPRESSOR) 25 MG tablet [id number] No Take 1 tablet (25 mg total) by mouth 2 (two) times daily. Jerrol Banana., MD 05/14/2021 0600 Active Self  naloxone (NARCAN) 4 MG/0.1ML LIQD nasal spray kit [id number] No Place 1 spray into the nose as needed (opioid overdose). [provider] Taking Active Self           Med Note Ivor Reining May 03, 2021 11:23 AM)    Naloxone HCl [facility] IJ) [id number] No Inject 1 Dose as directed as needed (opioid overdose). [provider] Taking Active Self           Med Note Ivor Reining May 03, 2021 11:32 AM)    naproxen (NAPROSYN) 500 MG tablet [id number]  Take 500 mg by mouth 2 (two) times daily. [provider]  Active   nitroGLYCERIN (NITROSTAT) 0.4 MG SL tablet [id number] No Place 1 tablet (0.4 mg total) under the tongue every 5 (five) minutes x 3 doses as needed for chest pain. [name], [name], [name] More than a month Active Self  omeprazole (PRILOSEC) 40 MG capsule [id number] No Take 1 capsule (40 mg total) by mouth 2 (two) times daily.  Patient not taking: Reported on 05/03/2021   [name], MD Not Taking Expired 02/11/21 2359   omeprazole (PRILOSEC) 40 MG capsule [id number] No TAKE 1 CAPSULE BY MOUTH TWICE A Othella Boyer, MD 05/14/2021 0600 Active Self  ondansetron (ZOFRAN-ODT) 8 MG  disintegrating tablet [id number]  Take 8 mg by mouth 2 (two) times daily as needed. [provider]  Active   Oxycodone HCl 10 MG TABS [id number]  Take 15 mg by mouth every 4 (four) hours as needed. [provider]  Active   Plecanatide (TRULANCE) 3 MG TABS [id number] No Take 1 tablet by mouth daily. [name], MD Taking Active Self           Med Note Ivor Reining May 03, 2021 11:29 AM) Has not started yet, will finish linzess first   [facility] RESPIMAT 2.5 MCG/ACT AERS [id number] No INHALE 1 PUFF BY MOUTH INTO LUNGS ONCE DAILY Virginia Crews, MD 05/13/2021 Active Self  sucralfate (CARAFATE) 1 GM/10ML suspension [id number] No TAKE 10 MLS (1 G TOTAL) BY MOUTH 4 (FOUR) TIMES DAILY. [name], MD 05/13/2021 Active Self  Patient Active Problem List   Diagnosis Date Noted   Pseudoarthrosis of cervical spine ([place]) 05/14/2021   Lumbar radiculopathy 05/23/2020   Sacroiliac inflammation ([place]) 11/25/2019   Sepsis ([place]) 10/19/2018   Severe sepsis ([place]) 10/19/2018   Cervical radiculopathy 02/12/2018   Chronic migraine 02/12/2018   Paresthesia 08/29/2017   Neck pain 08/29/2017   Daily headache 08/29/2017   Chronic active hepatitis ([place]) 07/29/2017   Neurogenic pain 07/31/2016   Chronic pain syndrome 06/05/2016   Carotid artery stenosis 02/27/2016   Chronic constipation 02/27/2016   Chronic nausea 02/27/2016   Hypercholesterolemia 02/27/2016   Osteoarthritis 02/27/2016   PTSD (post-traumatic stress disorder) 02/27/2016   TIA (transient ischemic attack) 02/27/2016   Long term current use of opiate analgesic 02/27/2016   Long term prescription opiate use 02/27/2016   Encounter for pain management consult 02/27/2016   Chronic hip pain (Location of Tertiary source of pain) (Bilateral) (L>R) 02/27/2016   Chronic knee pain (Bilateral) (L>R) 02/27/2016   Chronic shoulder pain (Bilateral) (L>R) 02/27/2016   Chronic sacroiliac joint pain (Bilateral) (L>R)  02/27/2016   Chronic low back pain (Location of Primary Source of Pain) (Bilateral) (L>R) 02/27/2016   Chronic lower extremity pain (Location of Secondary source of pain) (Bilateral) (L>R) 02/27/2016   Osteoarthritis of hip (Bilateral) (L>R) 02/27/2016   Chronic neck pain (posterior midline) (Bilateral) (L>R) 02/27/2016   Cervicogenic headache (Bilateral) (L>R) 02/27/2016   Occipital headache (Bilateral) (L>R) 02/27/2016   Chronic upper extremity pain (Bilateral) (L>R) 02/27/2016   Chronic Cervical radicular pain (Bilateral) (L>R) 02/27/2016   Chronic lumbar radicular pain (Right) (L5 dermatome) 02/27/2016   Lumbar facet syndrome (Bilateral) (L>R) 02/27/2016   Long term prescription benzodiazepine use 02/27/2016   Hypertension    Chronic obstructive pulmonary disease ([place]) 10/09/2015   GERD (gastroesophageal reflux disease) 10/09/2015   Non-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/2014 08/02/2015   Hyperlipidemia 08/02/2015   Costochondritis 08/02/2015   Drug abuse, 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[place]) 09/03/2014   GAD (generalized anxiety disorder) 09/03/2014   Narcotic dependence ([place]) 09/03/2014   PVD (peripheral vascular disease) ([place]) 09/03/2014   Smoker 09/03/2014   Cigarette nicotine dependence with nicotine-induced disorder 07/08/2014   Anemia of chronic disease 03/19/2014   Narcotic abuse, continuous ([place]) 03/19/2014   Polysubstance abuse ([place]) 03/19/2014   MSSA (methicillin susceptible Staphylococcus aureus) septicemia ([place]) 03/07/2014   Hypomagnesemia 01/04/2014   Chronic cough 09/04/2011   Sleep apnea, obstructive 09/04/2011   Sinusitis, acute 09/04/2011    Conditions to be addressed/monitored per PCP order:  Anxiety  Care Plan : LCSW Plan of Care  Updates made by Ethelda Chick since 07/06/2021 12:00 AM     Problem: Anxiety Identification (Anxiety)      Long-Range Goal: Anxiety Symptoms Identified   Start Date: 01/04/2021  Recent Progress: On track  Priority: High  Note:   Timeframe:   Long-Range Goal Priority:  High Start Date:    01/04/21                         Expected End Date:   ongoing                    Follow Up Date 07/06/21  Current barriers:   Chronic Mental Health needs related to anxiety, stress and depression Limited social support, Mental Health Concerns , and Social Isolation Needs Support, Education, and Care Coordination in order to meet unmet mental health needs. Clinical Goal(s): patient will work with SW to address concerns  related to increasing coping skills to help managed mood patient will work with [facility] to address needs related to finding a mental health psychiatrist    Clinical Interventions:  Assessed patient's previous and current treatment, coping skills, support system and barriers to care  Patient reports that she is in need of only medication management. She moved from [facility] to [facility]. Patient reports that she already has a Social worker in [place] at [employer] that she has built Museum/gallery exhibitions officer with. Patient is agreeable to [place] referral in order to find a psychiatrist for medication management. Referral placed on 01/04/21. Patient has her first initial appointment with a psychiatric nurse practitioner on 02/02/21 at 1:00 pm. This appointment is virtual and she has already received the link to join tele health session for 02/02/21. Update- 02/19/21- Patient reports that she successfully attended her virtual psychiatry visit with her matched psychiatric PA and was placed on a new medication. She reports liking her matched provider and has a follow up appointment with him next month. She was provided [facility]'s customer service number if she encounters any problems with this upcoming virtual visit. Update 04/20/21 has had made great progress with mental health treatment through [facility]. Their update by provider assigned to patient sstated Regression-None Change Minimal Progress-Some Progress (Progress since  beginning of treatment). Patient by Berniece Pap on 04/13/21. Patient reports that she has built great rapport with provider. Update- Patient has her next appointment with her psychiatrist on 05/31/21. These sessions remain virtual but her counseling sessions are in person. Patient was questioned about how her counseling sessions were going and she stated that she needs to contact them and schedule an appointment. Patient reports that her surgery on 05/14/21 was successful but she is experiencing pain which is affecting her sleep. Patient has her pain management appointment on 06/05/21 and her follow up surgery appointment on 06/07/21. Patient's significant other asked off of work for 2 weeks to assist her with her recovery.  Update 06/12/21- Patient is still seeing her psychiatrist monthly. She's sees her psychiatrist virtually. Patient reports that her significant other is back to work but her healing and recovery process is going well. She reports she she has started taking of her neck brace some as her surgeon advised. Patient reports that she has not had talk therapy in awhile and will contact her counselor as [employer] today to reschedule an appointment. Patient reports that she is managing her mental health well at this time. Patient reports implementing appropriate self-care habits into her daily routine such as: drinking water, staying active around the house, taking her medications as prescribed, combating negative thoughts or emotions and staying connected with her family and friends. Positive reinforcement provided.  LCSW provided education on relaxation techniques such as meditation, deep breathing, massage, grounding exercsies or yoga that can activate the body's relaxation response and ease symptoms of stress and anxiety. LCSW ask that when pt is struggling with difficult emotions and racing thoughts that they start this relaxation response process. LCSW provided extensive education  on healthy coping skills for anxiety. SW used active and reflective listening, validated patient's feelings/concerns, and provided emotional support. 07/06/21: Patient is continuing to see her therapist at [facility] in [place] and states she is doing well. No resources needed.     24- Hour Availability:    [facility]  [address] [place], [place] Crisis [phone]   [facility] [employer]  [place]  [phone]    [place]  [phone] (after hours)   Therapeutic Alternative/Mobile Crisis   [phone]   [place] National Suicide Hotline  272-619-4883 [place]amantina Monks) [place] 988   Call 911 or go to emergency room   [facility]  [phone]);  [place] and [employer]  [phone]); [place], [place], [place], [place], [place], [place], [place]        Review various resources, discussed options and provided patient information about Discussed several options for long term counseling based on need and insurance Depression screen reviewed , Solution-Focused Strategies, Mindfulness or Relaxation Training, Active listening / Reflection utilized , Emotional Supportive Provided, Brief CBT , and Suicidal Ideation/Homicidal Ideation assessed: ; Patient interviewed and appropriate assessments performed Discussed plans with patient for ongoing care management follow up and provided patient with direct contact information for care management team Assisted patient/caregiver with obtaining information about health plan benefits Provided education and assistance to client regarding Advanced Directives. Discussed several options for long term counseling based on need and insurance.  Inter-disciplinary care team collaboration (see longitudinal plan of care) Patient reports that she is still having issues with insomnia and is not sleeping well at  night.  LCSW provided education on healthy sleep hygiene and what that looks like. LCSW encouraged patient to implement a night time routine into her schedule that works best for her and that she is able to maintain. Advised patient to implement deep breathing/grounding/meditation/self-care exercises into her nightly routine to combat racing thoughts at night. LCSW encouraged patient to wake up at the same time each day, make her sleeping environment comfortable, exercise when able, to limit naps and to not eat or drink anything right before bed. 05/09/21 Update- Patient reports that she is doing well but experiencing chronic back pain. She has a spine surgery scheduled for 05/14/21. Patient reports that she no longer goes to counseling weekly at [facility] due to her improvement in her mental health. She reports going once per month now. Patient's next appointment with her psychiatrist that [facility] matched her with is on 05/25/21. Patient reports that she is taking her cymbalta and hydroxyzine as prescribed and she feels that these medications are effectively working for her. Patient shares that her main support is "My boyfriend Jeneen Rinks and God." Patient has a neurologist and GI appointment on 05/21/21 and has stable transportation to these appointments through her insurance.   Patient Goals/Self-Care Activities: Over the next 120 days - barriers to treatment adherence identified - complementary therapy use encouraged - counseling provided - depression screen reviewed - self-awareness of emotional triggers encouraged - strategies to manage emotional triggers promoted - avoid negative self-talk - develop a personal safety plan - develop a plan to deal with triggers like holidays, anniversaries - exercise at least 2 to 3 times per week - have a plan for how to handle bad days - journal feelings and what helps to feel better or worse - spend time or talk with others at least 2 to 3 times  per week - spend time or talk with others every day - watch for early signs of feeling worse - write in journal every day - begin personal counseling - call and visit an old friend - check out volunteer opportunities - join a support group - laugh; watch a funny movie or comedian - learn and use visualization or guided imagery - perform a random act of kindness - practice relaxation  or meditation daily - start or continue a personal journal - talk about feelings with a friend, family or spiritual advisor - practice positive thinking and self-talk        Follow up:  Patient agrees to Care Plan and Follow-up.  Plan: The Managed Medicaid care management team will reach out to the patient again over the next 30-45 days.    Mickel Fuchs, BSW, [place] Managed Medicaid Team  [phone]

## 2021-07-06 NOTE — Patient Instructions (Signed)
Visit Information  Ms. Moler was given information about Medicaid Managed Care team care coordination services as a part of their [place] Complete Medicaid benefit. [name] verbally consented to engagement with the [facility] Managed Care team.   If you are experiencing a medical emergency, please call 911 or report to your local emergency department or urgent care.   If you have a non-emergency medical problem during routine business hours, please contact your provider's office and ask to speak with a nurse.   For questions related to your [place] Complete Medicaid health plan, please call: [phone]  If you would like to schedule transportation through your [place] Complete Medicaid plan, please call the following number at least 2 days in advance of your appointment: [phone].   Call the [place] at [phone], at any time, 24 hours a day, 7 days a week. If you are in danger or need immediate medical attention call 911.  If you would like help to quit smoking, call 1-800-QUIT-NOW (870) 709-6073) OR Espaol: 1-855-Djelo-Ya (0-093-818-2993) o para ms informacin haga clic aqu or Text READY to 200-400 to register via text  Ms. Fiallos - following are the goals we discussed in your visit today:    The Managed Medicaid care management team will reach out to the patient again over the next 30-45 days.   Mickel Fuchs, BSW, [place]  High Risk Managed Medicaid Team  [phone]   Following is a copy of your plan of care:  Care Plan : LCSW Plan of Care  Updates made by Ethelda Chick since 07/06/2021 12:00 AM     Problem: Anxiety Identification (Anxiety)      Long-Range Goal: Anxiety Symptoms Identified   Start Date: 01/04/2021  Recent Progress: On track  Priority: High  Note:   Timeframe:  Long-Range Goal Priority:  High Start Date:    01/04/21                         Expected End Date:   ongoing                     Follow Up Date 07/06/21  Current barriers:   Chronic Mental Health needs related to anxiety, stress and depression Limited social support, Mental Health Concerns , and Social Isolation Needs Support, Education, and Care Coordination in order to meet unmet mental health needs. Clinical Goal(s): patient will work with SW to address concerns related to increasing coping skills to help managed mood patient will work with [place] to address needs related to finding a mental health psychiatrist    Clinical Interventions:  Assessed patient's previous and current treatment, coping skills, support system and barriers to care  Patient reports that she is in need of only medication management. She moved from [facility] to [facility]. Patient reports that she already has a Social worker in [place] at [employer] that she has built Museum/gallery exhibitions officer with. Patient is agreeable to [place] referral in order to find a psychiatrist for medication management. Referral placed on 01/04/21. Patient has her first initial appointment with a psychiatric nurse practitioner on 02/02/21 at 1:00 pm. This appointment is virtual and she has already received the link to join tele health session for 02/02/21. Update- 02/19/21- Patient reports that she successfully attended her virtual psychiatry visit with her matched psychiatric PA and was placed on a new medication. She reports liking her matched  provider and has a follow up appointment with him next month. She was provided [facility] customer service number if she encounters any problems with this upcoming virtual visit. Update 04/20/21 has had made great progress with mental health treatment through [facility]. Their update by provider assigned to patient sstated Regression-None Change Minimal Progress-Some Progress (Progress since beginning of treatment). Patient by Berniece Pap on 04/13/21. Patient reports that she has built great rapport  with provider. Update- Patient has her next appointment with her psychiatrist on 05/31/21. These sessions remain virtual but her counseling sessions are in person. Patient was questioned about how her counseling sessions were going and she stated that she needs to contact them and schedule an appointment. Patient reports that her surgery on 05/14/21 was successful but she is experiencing pain which is affecting her sleep. Patient has her pain management appointment on 06/05/21 and her follow up surgery appointment on 06/07/21. Patient's significant other asked off of work for 2 weeks to assist her with her recovery.  Update 06/12/21- Patient is still seeing her psychiatrist monthly. She's sees her psychiatrist virtually. Patient reports that her significant other is back to work but her healing and recovery process is going well. She reports she she has started taking of her neck brace some as her surgeon advised. Patient reports that she has not had talk therapy in awhile and will contact her counselor as [employer] today to reschedule an appointment. Patient reports that she is managing her mental health well at this time. Patient reports implementing appropriate self-care habits into her daily routine such as: drinking water, staying active around the house, taking her medications as prescribed, combating negative thoughts or emotions and staying connected with her family and friends. Positive reinforcement provided.  LCSW provided education on relaxation techniques such as meditation, deep breathing, massage, grounding exercsies or yoga that can activate the body's relaxation response and ease symptoms of stress and anxiety. LCSW ask that when pt is struggling with difficult emotions and racing thoughts that they start this relaxation response process. LCSW provided extensive education on healthy coping skills for anxiety. SW used active and reflective listening, validated patient's  feelings/concerns, and provided emotional support. 07/06/21: Patient is continuing to see her therapist at [facility] in [place] and states she is doing well. No resources needed.     24- Hour Availability:    [facility]  [address] [place], [place] [phone]   [facility] [employer] [place]  [phone]    [facility]  [phone] (after hours)   [facility]/Mobile Crisis   [phone]   [place] National Suicide Hotline  (254)768-2083 [place]am[place]ina Monks) [place] 988   Call 911 or go to emergency room   [facility]  [phone]);  [place] and [employer]  [phone]); [place], [place], [place], [place], [place], [place], [place]        Review various resources, discussed options and provided patient information about Discussed several options for long term counseling based on need and insurance Depression screen reviewed , Solution-Focused Strategies, Mindfulness or Relaxation Training, Active listening / Reflection utilized , Emotional Supportive Provided, Brief CBT , and Suicidal Ideation/Homicidal Ideation assessed: ; Patient interviewed and appropriate assessments performed Discussed plans with patient for ongoing care management follow up and provided patient with direct contact information for care management team Assisted patient/caregiver with obtaining information about health plan benefits Provided education  and assistance to client regarding Advanced Directives. Discussed several options for long term counseling based on need and insurance.  Inter-disciplinary care team collaboration (see longitudinal plan of care) Patient reports that she is still having issues with insomnia and is not sleeping well at night.  LCSW provided education on healthy sleep hygiene and what that looks like. LCSW encouraged  patient to implement a night time routine into her schedule that works best for her and that she is able to maintain. Advised patient to implement deep breathing/grounding/meditation/self-care exercises into her nightly routine to combat racing thoughts at night. LCSW encouraged patient to wake up at the same time each day, make her sleeping environment comfortable, exercise when able, to limit naps and to not eat or drink anything right before bed. 05/09/21 Update- Patient reports that she is doing well but experiencing chronic back pain. She has a spine surgery scheduled for 05/14/21. Patient reports that she no longer goes to counseling weekly at [facility] due to her improvement in her mental health. She reports going once per month now. Patient's next appointment with her psychiatrist that [facility] matched her with is on 05/25/21. Patient reports that she is taking her cymbalta and hydroxyzine as prescribed and she feels that these medications are effectively working for her. Patient shares that her main support is "My boyfriend Jeneen Rinks and God." Patient has a neurologist and GI appointment on 05/21/21 and has stable transportation to these appointments through her insurance.   Patient Goals/Self-Care Activities: Over the next 120 days - barriers to treatment adherence identified - complementary therapy use encouraged - counseling provided - depression screen reviewed - self-awareness of emotional triggers encouraged - strategies to manage emotional triggers promoted - avoid negative self-talk - develop a personal safety plan - develop a plan to deal with triggers like holidays, anniversaries - exercise at least 2 to 3 times per week - have a plan for how to handle bad days - journal feelings and what helps to feel better or worse - spend time or talk with others at least 2 to 3 times per week - spend time or talk with others every day - watch for early signs of feeling worse -  write in journal every day - begin personal counseling - call and visit an old friend - check out volunteer opportunities - join a support group - laugh; watch a funny movie or comedian - learn and use visualization or guided imagery - perform a random act of kindness - practice relaxation or meditation daily - start or continue a personal journal - talk about feelings with a friend, family or spiritual advisor - practice positive thinking and self-talk

## 2021-07-09 ENCOUNTER — Other Ambulatory Visit: Payer: Self-pay | Admitting: Gastroenterology

## 2021-07-09 ENCOUNTER — Encounter: Payer: Self-pay | Admitting: Neurology

## 2021-07-09 ENCOUNTER — Ambulatory Visit: Payer: Medicaid Other | Admitting: Neurology

## 2021-07-09 ENCOUNTER — Other Ambulatory Visit: Payer: Self-pay | Admitting: Family Medicine

## 2021-07-09 VITALS — BP 131/77 | HR 63 | Ht 64.0 in | Wt 195.0 lb

## 2021-07-09 DIAGNOSIS — R202 Paresthesia of skin: Secondary | ICD-10-CM | POA: Diagnosis not present

## 2021-07-09 DIAGNOSIS — E785 Hyperlipidemia, unspecified: Secondary | ICD-10-CM

## 2021-07-09 DIAGNOSIS — M542 Cervicalgia: Secondary | ICD-10-CM

## 2021-07-09 DIAGNOSIS — J432 Centrilobular emphysema: Secondary | ICD-10-CM

## 2021-07-09 DIAGNOSIS — G43709 Chronic migraine without aura, not intractable, without status migrainosus: Secondary | ICD-10-CM | POA: Diagnosis not present

## 2021-07-09 DIAGNOSIS — I10 Essential (primary) hypertension: Secondary | ICD-10-CM

## 2021-07-09 DIAGNOSIS — G4733 Obstructive sleep apnea (adult) (pediatric): Secondary | ICD-10-CM | POA: Insufficient documentation

## 2021-07-09 MED ORDER — UBRELVY 50 MG PO TABS: ORAL_TABLET | ORAL | 11 refills | Status: DC

## 2021-07-09 MED ORDER — AIMOVIG 70 MG/ML ~~LOC~~ SOAJ: 70.0000 mg | SUBCUTANEOUS | 11 refills | Status: DC

## 2021-07-09 MED ORDER — ONDANSETRON 4 MG PO TBDP: 4.0000 mg | ORAL_TABLET | Freq: Three times a day (TID) | ORAL | 0 refills | Status: DC | PRN

## 2021-07-09 MED ORDER — TIZANIDINE HCL 4 MG PO TABS: 4.0000 mg | ORAL_TABLET | Freq: Four times a day (QID) | ORAL | 6 refills | Status: DC | PRN

## 2021-07-09 NOTE — Telephone Encounter (Signed)
No future OV scheduled at this time. LOV 01/23/21-only V/S charted-unsure of this OV. Ventolin Inhaler last prescribed 04/18/21. All requested medications approved per protocol.  Requested Prescriptions  Pending Prescriptions Disp Refills   amLODipine (NORVASC) 5 MG tablet [Pharmacy Med Name: amlodipine 5 mg tablet] 90 tablet 0    Sig: TAKE ONE TABLET BY MOUTH ONCE DAILY     Cardiovascular:  Calcium Channel Blockers Passed - 07/09/2021  4:42 PM      Passed - Last BP in normal range    BP Readings from Last 1 Encounters:  07/09/21 131/77         Passed - Valid encounter within last 6 months    Recent Outpatient Visits          5 months ago Hypercholesterolemia   [facility], [name], [name]   1 year ago Encounter for tobacco use cessation counseling   [facility], [place], [place]   1 year ago Chronic nausea   [facility] Carles Collet M, [place]   1 year ago Pre-op examination   [facility] Carles Collet M, [place]   1 year ago Hypercholesterolemia   [facility], Wendee Beavers, [place]      Future Appointments            In 2 days [name], MD [place]            atorvastatin (LIPITOR) 20 MG tablet [name] Med Name: atorvastatin 20 mg tablet] 30 tablet 0    Sig: TAKE ONE TABLET BY MOUTH EVERY EVENING     Cardiovascular:  Antilipid - Statins Failed - 07/09/2021  4:42 PM      Failed - Total Cholesterol in normal range and within 360 days    Cholesterol, Total  Date Value Ref Range Status  02/20/2018 251 (H) 100 - 199 mg/dL Final         Failed - LDL in normal range and within 360 days    LDL Calculated  Date Value Ref Range Status  02/20/2018 161 (H) 0 - 99 mg/dL Final         Failed - HDL in normal range and within 360 days    HDL  Date Value Ref Range Status  02/20/2018 46 >39 mg/dL Final         Failed - Triglycerides in normal range and within 360 days     Triglycerides  Date Value Ref Range Status  02/20/2018 220 (H) 0 - 149 mg/dL Final         Passed - Patient is not pregnant      Passed - Valid encounter within last 12 months    Recent Outpatient Visits          5 months ago Hypercholesterolemia   [facility], [name], [name]   1 year ago Encounter for tobacco use cessation counseling   [facility], [place], [place]   1 year ago Chronic nausea   [facility] [place], Wendee Beavers, [place]   1 year ago Pre-op examination   [facility] [place], Wendee Beavers, [place]   1 year ago Hypercholesterolemia   [place], Wendee Beavers, [place]      Future Appointments            In 2 days [name], MD [place] 160-4.5 MCG/ACT inhaler [  Pharmacy Med Name: Symbicort 160 mcg-4.5 mcg/actuation HFA aerosol inhaler] 10.2 g 0    Sig: INHALE TWO PUFFS BY MOUTH INTO LUNGS IN THE MORNING and AT BEDTIME     Pulmonology:  Combination Products Passed - 07/09/2021  4:42 PM      Passed - Valid encounter within last 12 months    Recent Outpatient Visits          5 months ago Hypercholesterolemia   [facility], [name], [name]   1 year ago Encounter for tobacco use cessation counseling   [facility], Adriana M, [place]   1 year ago Chronic nausea   [facility] Carles Collet M, [place]   1 year ago Pre-op examination   [facility] Carles [facility]let [facility], [place]   1 year ago Hypercholesterolemia   [facility] [name], [place]      Future Appointments            In 2 days [name], MD [place]            lisinopril (ZESTRIL) 40 MG tablet [Pharmacy Med Name: lisinopril 40 mg tablet] 90 tablet 0    Sig: TAKE ONE TABLET BY MOUTH EVERY EVENING     Cardiovascular:  ACE Inhibitors Passed - 07/09/2021  4:42 PM      Passed -  Cr in normal range and within 180 days    Creatinine, Ser  Date Value Ref Range Status  05/16/2021 0.82 0.44 - 1.00 mg/dL Final         Passed - K in normal range and within 180 days    Potassium  Date Value Ref Range Status  05/16/2021 4.3 3.5 - 5.1 mmol/L Final         Passed - Patient is not pregnant      Passed - Last BP in normal range    BP Readings from Last 1 Encounters:  07/09/21 131/77         Passed - Valid encounter within last 6 months    Recent Outpatient Visits          5 months ago Hypercholesterolemia   [place], [name], [name]   1 year ago Encounter for tobacco use cessation counseling   [facility], [place], [place]   1 year ago Chronic nausea   [facility] [place], Wendee Beavers, [place]   1 year ago Pre-op examination   [facility] Carles Collet M, [place]   1 year ago Hypercholesterolemia   [place], Wendee Beavers, [place]      Future Appointments            In 2 days [name], MD [facility]             albuterol (VENTOLIN HFA) 108 (90 Base) MCG/ACT inhaler [Pharmacy Med Name: Ventolin HFA 90 mcg/actuation aerosol inhaler] 18 g 0    Sig: INHALE TWO PUFFS BY MOUTH INTO LUNGS EVERY 6 HOURS AS NEEDED FOR SHORTNESS OF BREATH OR wheezing     Pulmonology:  Beta Agonists Failed - 07/09/2021  4:42 PM      Failed - One inhaler should last at least one month. If the patient is requesting refills earlier, contact the patient to check for uncontrolled symptoms.      Passed - Valid encounter within last 12 months    Recent Outpatient Visits  5 months ago Hypercholesterolemia   [place], [name]   1 year ago Encounter for tobacco use cessation counseling   [facility] Carles Collet M, [place]   1 year ago Chronic nausea   [facility], Wendee Beavers, [place]   1 year ago Pre-op  examination   [facility] Carles Collet M, [place]   1 year ago Hypercholesterolemia   [facility] [name], [place]      Future Appointments            In 2 days [name], MD [place]

## 2021-07-09 NOTE — Patient Instructions (Signed)
Meds ordered this encounter  Medications   Erenumab-aooe (AIMOVIG) 70 MG/ML SOAJ once every month as headache prevention    Sig: Inject 70 mg into the skin every 30 (thirty) days.    Dispense:  1.12 mL    Refill:  11   Ubrogepant (UBRELVY) 50 MG TABS    Sig: Take 1 tab at onset of migraine.  May repeat in 2 hrs, if needed.  Max dose: 2 tabs/day. This is a 30 day prescription.    Dispense:  12 tablet    Refill:  11   tiZANidine (ZANAFLEX) 4 MG tablet    Sig: Take 1 tablet (4 mg total) by mouth every 6 (six) hours as needed for muscle spasms.    Dispense:  30 tablet    Refill:  6   ondansetron (ZOFRAN-ODT) 4 MG disintegrating tablet    Sig: Take 1 tablet (4 mg total) by mouth every 8 (eight) hours as needed for nausea or vomiting.    Dispense:  20 tablet    Refill:  0  You may take Ubrelvy as needed for severe headaches, may mix together with tizanidine as muscle relaxant, Zofran for nausea, and sleep,  Please limit Ubrelvy use less than 2-3 times a week

## 2021-07-09 NOTE — Progress Notes (Signed)
Chief Complaint  Patient presents with   Follow-up    Room 14 - alone. She has continued neck pain. Still under the care of [facility]. Reports having some type of headache daily. Taking OTC aspirin 338m every day for them with no relief.       ASSESSMENT AND PLAN  Sara Munoz a 56y.o. female   Long history of chronic migraine headaches Multiple cervical decompression surgery, continue complains of neck pain, under pain management  Headache has a lot of migraine features, likely cervicogenic component, medication rebound  Add on aimovig 70 mg monthly as preventive medication  Ubrelvy as needed for severe headache only, limited use less than twice each week, may combine with tizanidine, Zofran for prolonged headaches,  Obstructive sleep apnea  Narrow oropharyngeal space, loud snoring, frequent awakening, morning headaches,  Referral to sleep study Bilateral hands paresthesia  Residual symptoms from cervical stenosis versus focal neuropathy  EMG nerve conduction study   DIAGNOSTIC DATA (LABS, IMAGING, TESTING) - I reviewed patient records, labs, notes, testing and imaging myself where available.   MEDICAL HISTORY:  Sara BERBERICHis a 56year old female, seen in request by Dr. BVirginia Crews for evaluation of neck pain, frequent headaches,  I reviewed and summarized the referring note. PMHx HTN HLD Chronic neck pain, under [facility], taking oxycodone 172mx5, gabapentin 600073m4. Smoke 1/2 ppd x40. CAD, Lumbar decompression surgery.   I saw her previously in 2019, reported more than 1 year history of worsening neck pain, radiating paresthesias throughout her spine and 4 extremity with neck movement, MRI of cervical spine showed central disc protrusion at C6-7, with mild to moderate canal stenosis, cord flattening, canal diameter was 6 mm, but no cord signal abnormality MRI of the brain showed no acute abnormality   EMG nerve  conduction study in April 2019 showed mild right carpal tunnel syndrome  She complains of worsening neck pain radiating pain to bilateral shoulder, right hands paresthesia, despite titrating dose of gabapentin, also under pain management oxycodone,  She eventually underwent laminectomy C5-6, C6-7, C7-T1 with posterior instrumented fusion in November 2022 by Dr. OstZada FindersSurgery did help her neck pain some, but she still has frequent headaches, daily 7 out of 10, 2-3 times each week, it would exacerbate, much severe pain, spreading from abdominal, occipital region forward, severe pain with light noise sensitivity, she has been taking aspirin 2-3 times a day to alleviate the symptoms  She denies significant gait abnormality, no bowel bladder incontinence  In addition, she complains of fatigue, sleepiness, mild snoring, has very narrow oropharyngeal space,   PHYSICAL EXAM:   Vitals:   07/09/21 0738  BP: 131/77  Pulse: 63  Weight: 195 lb (88.5 kg)  Height: _0  (1.626 m)   Not recorded     Body mass index is 33.47 kg/m.  PHYSICAL EXAMNIATION: Depressed looking middle-aged female  Gen: NAD, conversant, well nourised, well groomed                     Cardiovascular: Regular rate rhythm, no peripheral edema, warm, nontender. Eyes: Conjunctivae clear without exudates or hemorrhage Neck: Supple, no carotid bruits. Pulmonary: Clear to auscultation bilaterally   NEUROLOGICAL EXAM:  MENTAL STATUS: Speech:    Speech is normal; fluent and spontaneous with normal comprehension.  Cognition:     Orientation to time, place and person     Normal recent and remote memory  Normal Attention span and concentration     Normal Language, naming, repeating,spontaneous speech     Fund of knowledge   CRANIAL NERVES: CN II: Visual fields are full to confrontation. Pupils are round equal and briskly reactive to light. CN III, IV, VI: extraocular movement are normal. No ptosis. CN V:  Facial sensation is intact to light touch CN VII: Face is symmetric with normal eye closure  CN VIII: Hearing is normal to causal conversation. CN IX, X: Phonation is normal. CN XI: Head turning and shoulder shrug are intact  MOTOR: There is no pronator drift of out-stretched arms. Muscle bulk and tone are normal. Muscle strength is normal.  REFLEXES: Reflexes are 2+ and symmetric at the biceps, triceps, knees, and ankles. Plantar responses are flexor.  SENSORY: Intact to light touch, pinprick and vibratory sensation are intact in fingers and toes, with exception of decreased to finger pads at bilateral finger tips  COORDINATION: There is no trunk or limb dysmetria noted.  GAIT/STANCE: She is able to get up from seated position arm crossed, steady  REVIEW OF SYSTEMS:  Full 14 system review of systems performed and notable only for as above All other review of systems were negative.   ALLERGIES: Allergies  Allergen Reactions   Flexeril [Cyclobenzaprine] Hives and Swelling    Facial/lip swelling      Levofloxacin Hives and Swelling   Phenergan [Promethazine Hcl] Other (See Comments)    Agitation.    Toradol [Ketorolac Tromethamine] Swelling and Other (See Comments)    Facial/tongue swelling    Tramadol Hives, Swelling and Other (See Comments)    Lip swelling    Zoloft [Sertraline Hcl] Swelling    Tongue swelling       HOME MEDICATIONS: Current Outpatient Medications  Medication Sig Dispense Refill   albuterol (PROAIR HFA) 108 (90 Base) MCG/ACT inhaler INHALE TWO PUFFS BY MOUTH INTO LUNGS EVERY 6 HOURS AS NEEDED FOR SHORTNESS OF BREATH OR wheezing 8.5 g 0   albuterol (PROVENTIL) (2.5 MG/3ML) 0.083% nebulizer solution Take 3 mLs (2.5 mg total) by nebulization every 6 (six) hours as needed for wheezing or shortness of breath. 75 mL 12   amLODipine (NORVASC) 5 MG tablet Take 1 tablet (5 mg total) by mouth daily. 30 tablet 0   atorvastatin (LIPITOR) 20 MG tablet Take 1  tablet (20 mg total) by mouth every evening. 30 tablet 0   budesonide-formoterol (SYMBICORT) 160-4.5 MCG/ACT inhaler Inhale 2 puffs into the lungs in the morning and at bedtime. 10.2 g 0   docusate sodium (COLACE) 100 MG capsule Take 100 mg by mouth 2 (two) times daily.     DULoxetine (CYMBALTA) 30 MG capsule Take 30 mg by mouth 2 (two) times daily.     gabapentin (NEURONTIN) 600 MG tablet Take 600 mg by mouth 4 (four) times daily.     lisinopril (ZESTRIL) 40 MG tablet Take 1 tablet (40 mg total) by mouth every evening. 30 tablet 0   metoprolol tartrate (LOPRESSOR) 25 MG tablet Take 1 tablet (25 mg total) by mouth 2 (two) times daily. 60 tablet 5   naloxone (NARCAN) 4 MG/0.1ML LIQD nasal spray kit Place 1 spray into the nose as needed (opioid overdose).     Naloxone HCl (NARCAN IJ) Inject 1 Dose as directed as needed (opioid overdose).     omeprazole (PRILOSEC) 40 MG capsule TAKE 1 CAPSULE BY MOUTH TWICE A DAY 180 capsule 0   ondansetron (ZOFRAN-ODT) 8 MG disintegrating tablet TAKE ONE TABLET BY  MOUTH TWICE DAILY AS NEEDED 20 tablet 4   oxyCODONE HCl 15 MG TABA Take 15 tablets by mouth 5 (five) times daily.     Plecanatide (TRULANCE) 3 MG TABS Take 1 tablet by mouth daily. 90 tablet 3   SPIRIVA RESPIMAT 2.5 MCG/ACT AERS INHALE 1 PUFF BY MOUTH INTO LUNGS ONCE DAILY 4 g 0   sucralfate (CARAFATE) 1 GM/10ML suspension TAKE 10 MLS (1 G TOTAL) BY MOUTH 4 (FOUR) TIMES DAILY. 840 mL 0   No current facility-administered medications for this visit.    PAST MEDICAL HISTORY: Past Medical History:  Diagnosis Date   Anemia    Anxiety    Asthma    Atypical chest pain 08/02/2015   CAD (coronary artery disease)    Chronic back pain    COPD (chronic obstructive pulmonary disease) (HCC)    Coronary artery disease    a. Mild-non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/2014   GERD (gastroesophageal reflux disease)    Hepatitis C    per patient, "took medicines for it and no longer has it"   Hypercholesteremia     Hypertension    Hypokalemia    Hypomagnesemia 01/04/2014   Liver disease    MI (myocardial infarction) ([facility])    Nerve pain    per patient, in the lower back   Opiate use 02/27/2016   Osteoarthritis    Pneumonia    Stroke [facility])     PAST SURGICAL HISTORY: Past Surgical History:  Procedure Laterality Date   ANTERIOR CERVICAL DECOMP/DISCECTOMY FUSION N/A 03/30/2018   Procedure: Cervical six-seven Anterior cervical decompression/discectomy/fusion;  Surgeon: Judith Part, MD;  Location: [place];  Service: Neurosurgery;  Laterality: N/A;   ANTERIOR CERVICAL DECOMP/DISCECTOMY FUSION N/A 01/11/2019   Procedure: Cervical Five-Six Anterior cervical discectomy fusion,  Cervical Five to Cervical Seven anterior instrumented fusion;  Surgeon: Judith Part, MD;  Location: [place];  Service: Neurosurgery;  Laterality: N/A;  Cervical Five-Six Anterior cervical discectomy fusion,  Cervical Five to Cervical Seven anterior instrumented fusion   BACK SURGERY     CARDIAC CATHETERIZATION Left 02/22/2016   Procedure: Left Heart Cath and Coronary Angiography;  Surgeon: Yolonda Kida, MD;  Location: [place] CV [facility];  Service: Cardiovascular;  Laterality: Left;   CATARACT EXTRACTION W/ INTRAOCULAR LENS IMPLANT Right    COLONOSCOPY WITH PROPOFOL N/A 09/19/2016   Procedure: COLONOSCOPY WITH PROPOFOL;  Surgeon: [name], MD;  Location: [facility] ENDOSCOPY;  Service: Endoscopy;  Laterality: N/A;   DIAGNOSTIC LAPAROSCOPY     ESOPHAGOGASTRODUODENOSCOPY (EGD) WITH PROPOFOL N/A 09/18/2017   Procedure: ESOPHAGOGASTRODUODENOSCOPY (EGD) WITH PROPOFOL;  Surgeon: [name], MD;  Location: [facility] ENDOSCOPY;  Service: Gastroenterology;  Laterality: N/A;   ESOPHAGOGASTRODUODENOSCOPY (EGD) WITH PROPOFOL N/A 08/10/2019   Procedure: ESOPHAGOGASTRODUODENOSCOPY (EGD) WITH PROPOFOL;  Surgeon: [name], MD;  Location: [facility] ENDOSCOPY;  Service: Endoscopy;  Laterality: N/A;   EYE SURGERY     R eye - cataract    FRACTURE SURGERY     hemorroids     NASAL SINUS SURGERY     ORIF FEMUR FRACTURE     ORIF TIBIA & FIBULA FRACTURES     OVARY SURGERY     POSTERIOR CERVICAL FUSION/FORAMINOTOMY N/A 05/14/2021   Procedure: Cervical five, Cervical six Laminectomy,foraminotomy with Cervical five-six Cervical six-seven Cervical seven-Thoracic one Posterior instrumented fusion;  Surgeon: Judith Part, MD;  Location: [place];  Service: Neurosurgery;  Laterality: N/A;   TRANSFORAMINAL LUMBAR INTERBODY FUSION W/ MIS 1 LEVEL Right 05/23/2020   Procedure:  Right Lumbar Five Sacral One Minimally invasive transforaminal lumbar interbody fusion;  Surgeon: Judith Part, MD;  Location: [place];  Service: Neurosurgery;  Laterality: Right;  Right Lumbar Five Sacral One Minimally invasive transforaminal lumbar interbody fusion    FAMILY HISTORY: Family History  Problem Relation Age of Onset   Heart disease Mother    Lung cancer Mother    Ovarian cancer Mother    Healthy Brother    Healthy Brother    Healthy Brother    Diabetes Maternal Uncle    Breast cancer Maternal Aunt     SOCIAL HISTORY: Social History   Socioeconomic History   Marital status: Widowed    Spouse name: Not on file   Number of children: 0   Years of education: Not on file   Highest education level: 9th grade  Occupational History   Occupation: disabled  Tobacco Use   Smoking status: Every Day    Packs/day: 0.50    Years: 35.00    Pack years: 17.50    Types: Cigarettes   Smokeless tobacco: Never   Tobacco comments:    07/2019- 1.5 a day  Vaping Use   Vaping Use: Former  Substance and Sexual Activity   Alcohol use: Not Currently    Alcohol/week: 0.0 standard drinks   Drug use: Not Currently    Types: Marijuana   Sexual activity: Yes    Birth control/protection: None  Other Topics Concern   Not on file  Social History Narrative   Moved from [place]   Social Determinants of Health   Financial Resource Strain: Medium  Risk   Difficulty of Paying Living Expenses: Somewhat hard  Food Insecurity: No Food Insecurity   Worried About Charity fundraiser in the Last Year: Never true   Arboriculturist in the Last Year: Never true  Transportation Needs: Unmet Transportation Needs   Lack of Transportation (Medical): Yes   Lack of Transportation (Non-Medical): Yes  Physical Activity: Inactive   Days of Exercise per Week: 0 days   Minutes of Exercise per Session: 0 min  Stress: No Stress Concern Present   Feeling of Stress : Only a little  Social Connections: Socially Isolated   Frequency of Communication with Friends and Family: More than three times a week   Frequency of Social Gatherings with Friends and Family: More than three times a week   Attends Religious Services: Never   Marine scientist or Organizations: No   Attends Archivist Meetings: Never   Marital Status: Widowed  Intimate Partner Violence: Not At Risk   Fear of Current or Ex-Partner: No   Emotionally Abused: No   Physically Abused: No   Sexually Abused: No      [name], M.D. Ph.D.  [facility] [address] [place], [place] [address] Ph: [phone] Fax: [phone]  CC:  Virginia Crews, [address] [place],  [place] [address]  [name], [name] (Inactive)

## 2021-07-10 ENCOUNTER — Telehealth: Payer: Self-pay | Admitting: *Deleted

## 2021-07-10 DIAGNOSIS — G43709 Chronic migraine without aura, not intractable, without status migrainosus: Secondary | ICD-10-CM

## 2021-07-10 MED ORDER — SUMATRIPTAN SUCCINATE 25 MG PO TABS: ORAL_TABLET | ORAL | 3 refills | Status: DC

## 2021-07-10 NOTE — Addendum Note (Signed)
Addended by: [name] on: 07/10/2021 01:46 PM   Modules accepted: Orders

## 2021-07-10 NOTE — Telephone Encounter (Signed)
Aimovig approved.   Ubrelvy denied. Per patient, she has only tried NSAIDS and rizatriptan in past. Her plan requires her to also try sumatriptan. Also, the plan requires her to have less than 15 headache days each month. She reported more days.  We will ask Dr. Krista Blue for authorization to change her rescue medication to sumatriptan. The patient is agreeable to try whichever medication is recommended.

## 2021-07-10 NOTE — Addendum Note (Signed)
Addended by: Noberto Retort C on: 07/10/2021 02:00 PM   Modules accepted: Orders

## 2021-07-10 NOTE — Telephone Encounter (Addendum)
The patient is aware to check with her pharmacy. I called the pharmacy and spoke to [facility]. She will update the patient's profile.

## 2021-07-10 NOTE — Telephone Encounter (Signed)
Meds ordered this encounter  Medications   SUMAtriptan (IMITREX) 25 MG tablet    Sig: May repeat in 2 hours if headache persists or recurs.    Dispense:  10 tablet    Refill:  3

## 2021-07-10 NOTE — Telephone Encounter (Addendum)
Patient has pharmacy coverage with [place] Medicaid [phone] R).   PA for Aimovig 70mg  started on covermymeds (key: [id number]). PA approved through 10/08/2021.  PA for Ubrelvy 50mg  started on covermymeds (key: [id number]). Decision pending.

## 2021-07-11 ENCOUNTER — Ambulatory Visit (INDEPENDENT_AMBULATORY_CARE_PROVIDER_SITE_OTHER): Payer: Medicaid Other | Admitting: Gastroenterology

## 2021-07-11 ENCOUNTER — Other Ambulatory Visit: Payer: Self-pay

## 2021-07-11 ENCOUNTER — Encounter: Payer: Self-pay | Admitting: Gastroenterology

## 2021-07-11 VITALS — BP 128/72 | HR 81 | Temp 98.1°F | Wt 194.0 lb

## 2021-07-11 DIAGNOSIS — Z791 Long term (current) use of non-steroidal anti-inflammatories (NSAID): Secondary | ICD-10-CM

## 2021-07-11 DIAGNOSIS — R109 Unspecified abdominal pain: Secondary | ICD-10-CM | POA: Diagnosis not present

## 2021-07-11 DIAGNOSIS — K5900 Constipation, unspecified: Secondary | ICD-10-CM

## 2021-07-11 MED ORDER — LACTULOSE 10 GM/15ML PO SOLN: 20.0000 g | Freq: Four times a day (QID) | ORAL | 0 refills | Status: DC

## 2021-07-11 NOTE — Progress Notes (Signed)
[name] MD, MRCP(U.K) [address]  [place]  [place], [place] [address]  Main: [phone]  Fax: [phone]   Primary Care Physician: Paulene Floor (Inactive)  Primary Gastroenterologist:  Dr. [name]   Chief complaint: Follow-up for constipation  HPI: Sara Munoz is a 56 y.o. female   Summary of history : Sara Munoz was initially referred back in 05/2016 for hepatitis C GT 3  Completed treatment in 08/2017 and attained SVR. constipation, hepatic steatosis, gastritis.  Upper GI bleed secondary to [facility] powder use.     CT abdomen 03/17/17- increased stool burden and hepatic steatosis.   Colonoscopy 08/2016 - small adenomas excised  RUQ USG 05/07/16 showed hepatic steatosis.  09/18/17 : EGD: normal appearance but bx showed mild active gastritis and negative for H pylori .  02/25/18: H pylori breath test negative   10/19/2018: CT scan of the abdomenPerinephric fat stranding was seen 07/27/2019: Seen at the office complaint of melena was taking BC powders for headaches for years.  08/10/2019 EGD: Localized inflammation seen in the antrum suggestive of gastritis biopsies taken that demonstrated reactive gastropathy.  Negative for H. pylori.     Interval history 11/16/2020-07/11/2021  At her last visit she resumed taking BC powders for back pain.Marland Kitchen  She was advised to stop smoking as well as stop NSAID use.  Today she is back to see me for abdominal pain which is very similar to what it was in the past.  She states she had neck surgery.  Been taking BC powders.  She is also constipated despite taking the Trulance.  Would like something stronger.  She is taking her omeprazole 40 mg twice a day before meals.  She also complains of some blood on her stool/toilet paper when she passes hard stools.   Current Outpatient Medications  Medication Sig Dispense Refill   albuterol (PROVENTIL) (2.5 MG/3ML) 0.083% nebulizer solution Take 3 mLs (2.5 mg total) by nebulization  every 6 (six) hours as needed for wheezing or shortness of breath. 75 mL 12   albuterol (VENTOLIN HFA) 108 (90 Base) MCG/ACT inhaler INHALE TWO PUFFS BY MOUTH INTO LUNGS EVERY 6 HOURS AS NEEDED FOR SHORTNESS OF BREATH OR wheezing 18 g 0   amLODipine (NORVASC) 5 MG tablet TAKE ONE TABLET BY MOUTH ONCE DAILY 90 tablet 0   atorvastatin (LIPITOR) 20 MG tablet TAKE ONE TABLET BY MOUTH EVERY EVENING 30 tablet 0   docusate sodium (COLACE) 100 MG capsule Take 100 mg by mouth 2 (two) times daily.     doxycycline (VIBRA-TABS) 100 MG tablet Take 1 tablet by mouth in the morning and at bedtime.     DULoxetine (CYMBALTA) 30 MG capsule Take 30 mg by mouth 2 (two) times daily.     Erenumab-aooe (AIMOVIG) 70 MG/ML SOAJ Inject 70 mg into the skin every 30 (thirty) days. 1.12 mL 11   gabapentin (NEURONTIN) 600 MG tablet Take 600 mg by mouth 4 (four) times daily.     hydrOXYzine (ATARAX) 50 MG tablet Take 50 mg by mouth 3 (three) times daily.     lisinopril (ZESTRIL) 40 MG tablet TAKE ONE TABLET BY MOUTH EVERY EVENING 90 tablet 0   metoprolol tartrate (LOPRESSOR) 25 MG tablet Take 1 tablet (25 mg total) by mouth 2 (two) times daily. 60 tablet 5   naloxone (NARCAN) 4 MG/0.1ML LIQD nasal spray kit Place 1 spray into the nose as needed (opioid overdose).     Naloxone HCl [place]  IJ) Inject 1 Dose as directed as needed (opioid overdose).     naproxen (NAPROSYN) 500 MG tablet Take 500 mg by mouth 2 (two) times daily.     omeprazole (PRILOSEC) 40 MG capsule TAKE 1 CAPSULE BY MOUTH TWICE A DAY 180 capsule 0   ondansetron (ZOFRAN-ODT) 4 MG disintegrating tablet Take 1 tablet (4 mg total) by mouth every 8 (eight) hours as needed for nausea or vomiting. 20 tablet 0   ondansetron (ZOFRAN-ODT) 8 MG disintegrating tablet TAKE ONE TABLET BY MOUTH TWICE DAILY AS NEEDED 20 tablet 4   oxyCODONE HCl 15 MG TABA Take 15 tablets by mouth 5 (five) times daily.     Plecanatide (TRULANCE) 3 MG TABS Take 1 tablet by mouth daily. 90 tablet 3    SPIRIVA RESPIMAT 2.5 MCG/ACT AERS INHALE 1 PUFF BY MOUTH INTO LUNGS ONCE DAILY 4 g 0   sucralfate (CARAFATE) 1 GM/10ML suspension TAKE 10 MLS (1 G TOTAL) BY MOUTH 4 (FOUR) TIMES DAILY. 840 mL 0   SUMAtriptan (IMITREX) 25 MG tablet May repeat in 2 hours if headache persists or recurs. 10 tablet 3   SYMBICORT 160-4.5 MCG/ACT inhaler INHALE TWO PUFFS BY MOUTH INTO LUNGS IN THE MORNING and AT BEDTIME 10.2 g 0   tiZANidine (ZANAFLEX) 4 MG tablet Take 1 tablet (4 mg total) by mouth every 6 (six) hours as needed for muscle spasms. 30 tablet 6   No current facility-administered medications for this visit.    Allergies as of 07/11/2021 - Review Complete 07/11/2021  Allergen Reaction Noted   Flexeril [cyclobenzaprine] Hives and Swelling 11/13/2013   Levofloxacin Hives and Swelling 02/27/2012   Phenergan [promethazine hcl] Other (See Comments) 02/21/2017   Toradol [ketorolac tromethamine] Swelling and Other (See Comments) 01/17/2015   Tramadol Hives, Swelling, and Other (See Comments) 01/17/2015   Zoloft [sertraline hcl] Swelling 05/31/2015    ROS:  General: Negative for anorexia, weight loss, fever, chills, fatigue, weakness. ENT: Negative for hoarseness, difficulty swallowing , nasal congestion. CV: Negative for chest pain, angina, palpitations, dyspnea on exertion, peripheral edema.  Respiratory: Negative for dyspnea at rest, dyspnea on exertion, cough, sputum, wheezing.  GI: See history of present illness. GU:  Negative for dysuria, hematuria, urinary incontinence, urinary frequency, nocturnal urination.  Endo: Negative for unusual weight change.    Physical Examination:   BP 128/72    Pulse 81    Temp 98.1 F (36.7 C) (Oral)    Wt 194 lb (88 kg)    LMP 12/16/2017 (Approximate)    BMI 33.30 kg/m   General: Well-nourished, well-developed in no acute distress.  Eyes: No icterus. Conjunctivae pink. Mouth: Oropharyngeal mucosa moist and pink , no lesions erythema or exudate. Abdomen:  Bowel sounds are normal, nontender, nondistended, no hepatosplenomegaly or masses, no abdominal bruits or hernia , no rebound or guarding.   Extremities: No lower extremity edema. No clubbing or deformities. Neuro: Alert and oriented x 3.  Grossly intact. Skin: Warm and dry, no jaundice.   Psych: Alert and cooperative, normal mood and affect.   Imaging Studies: No results found.  Assessment and Plan:   Sara Munoz is a 56 y.o. y/o female with a prior history of constipation, GI bleed secondary to NSAID use, hepatic steatosis here to see me for chronic abdominal pain which is very likely secondary to inflammation of the GI tract due to chronic NSAID use i.e. BC powder, constipation secondary to oxycodone use and aerophagia secondary to smoking.  She has been advised in  the past to stop the [facility] powder as well as smoking which she really has not followed through       Plan  1.  Continue Prilosec 40 mg twice a day. 2.  Stop all NSAID use and continue to do so. 3.  EGD to rule out large ulcer 4. Smoker: Advised to strongly stop smoking as it can contribute to aerophagia and nausea. 5.  chronic constipation continue taking Trulance in addition suggest to add lactulose 20 cc 4 times daily 6.  Discussed about aggressive treatment of her constipation with the lactulose if she still has bleeding likely due to hemorrhoids and passage of hard stool then we can consider banding  I have discussed alternative options, risks & benefits,  which include, but are not limited to, bleeding, infection, perforation,respiratory complication & drug reaction.  The patient agrees with this plan & written consent will be obtained.       Dr [name]  MD,MRCP [facility]) Follow up in 8 to 12 weeks

## 2021-07-11 NOTE — Addendum Note (Signed)
Addended by: Wayna Chalet on: 07/11/2021 03:26 PM   Modules accepted: Orders

## 2021-07-11 NOTE — Patient Instructions (Signed)
Please stop taking NSAIDs.

## 2021-07-23 ENCOUNTER — Other Ambulatory Visit: Payer: Self-pay | Admitting: Neurological Surgery

## 2021-07-23 DIAGNOSIS — S129XXA Fracture of neck, unspecified, initial encounter: Secondary | ICD-10-CM

## 2021-07-25 ENCOUNTER — Telehealth: Payer: Self-pay | Admitting: Physician Assistant

## 2021-07-25 NOTE — Telephone Encounter (Signed)
.. °  Medicaid Managed Care   Unsuccessful Outreach Note  07/25/2021 Name: Sara Munoz MRN: [id number] DOB: [birth year]  Referred by: [name], [name] (Inactive) Reason for referral : High Risk Managed Medicaid (I called the patient today to get her rescheduled for a phone visit with the MM LCSW. I left my name and number on her VM.)   An unsuccessful telephone outreach was attempted today. The patient was referred to the case management team for assistance with care management and care coordination.   Follow Up Plan: The care management team will reach out to the patient again over the next 10 days.    [place]

## 2021-07-26 DIAGNOSIS — F411 Generalized anxiety disorder: Secondary | ICD-10-CM | POA: Diagnosis not present

## 2021-07-26 DIAGNOSIS — G4700 Insomnia, unspecified: Secondary | ICD-10-CM | POA: Diagnosis not present

## 2021-07-27 ENCOUNTER — Other Ambulatory Visit: Payer: Self-pay

## 2021-07-27 ENCOUNTER — Other Ambulatory Visit: Payer: Self-pay | Admitting: Obstetrics and Gynecology

## 2021-07-27 NOTE — Patient Outreach (Signed)
Medicaid Managed Care   Nurse Care Manager Note  07/27/2021 Name:  Sara Munoz MRN:  [id number] DOB:  Feb 24, 1966  Sara Munoz is an 56 y.o. year old female who is a primary patient of [name], [name] (Inactive).  The [facility] Managed Care Coordination team was consulted for assistance with:    Chronic healthcare management needs, HTN, chronic pain, tobacco use, GERD, migraines, COPD, OSA, anxiety, PVD, constipation.  Sara Munoz was given information about Medicaid Managed Care Coordination team services today. Sara Munoz Patient agreed to services and verbal consent obtained.  Engaged with patient by telephone for follow up visit in response to provider referral for case management and/or care coordination services.   Assessments/Interventions:  Review of past medical history, allergies, medications, health status, including review of consultants reports, laboratory and other test data, was performed as part of comprehensive evaluation and provision of chronic care management services.  SDOH (Social Determinants of Health) assessments and interventions performed: SDOH Interventions    Flowsheet Row Most Recent Value  SDOH Interventions   Food Insecurity Interventions Intervention Not Indicated  Physical Activity Interventions --  [patient with chronic pain and not able to engage in moderate to strenuous exercise.]       Care Plan  Allergies  Allergen Reactions   Flexeril [Cyclobenzaprine] Hives and Swelling    Facial/lip swelling      Levofloxacin Hives and Swelling   Phenergan [Promethazine Hcl] Other (See Comments)    Agitation.    Toradol [Ketorolac Tromethamine] Swelling and Other (See Comments)    Facial/tongue swelling    Tramadol Hives, Swelling and Other (See Comments)    Lip swelling    Zoloft [Sertraline Hcl] Swelling    Tongue swelling       Medications Reviewed Today     Reviewed by Frederick Peers, RN (Registered Nurse) on 07/27/21 at [place]  List Status: <None>   Medication Order Taking? Sig Documenting Provider Last Dose Status Informant  albuterol (PROVENTIL) (2.5 MG/3ML) 0.083% nebulizer solution [id number] No Take 3 mLs (2.5 mg total) by nebulization every 6 (six) hours as needed for wheezing or shortness of breath. [name], MD Taking Active Self  albuterol (VENTOLIN HFA) 108 (90 Base) MCG/ACT inhaler [id number] No INHALE TWO PUFFS BY MOUTH INTO LUNGS EVERY 6 HOURS AS NEEDED FOR SHORTNESS OF BREATH OR wheezing Jerrol Banana., MD Taking Active   amLODipine [facility]) 5 MG tablet [id number] No TAKE ONE TABLET BY MOUTH ONCE DAILY Jerrol Banana., MD Taking Active   atorvastatin (LIPITOR) 20 MG tablet [id number] No TAKE ONE TABLET BY MOUTH EVERY EVENING Jerrol Banana., MD Taking Active   docusate sodium (COLACE) 100 MG capsule [id number] No Take 100 mg by mouth 2 (two) times daily. [provider] Taking Active Self  doxycycline (VIBRA-TABS) 100 MG tablet [id number] No Take 1 tablet by mouth in the morning and at bedtime. [provider] Taking Active   DULoxetine (CYMBALTA) 30 MG capsule [id number] No Take 30 mg by mouth 2 (two) times daily. [provider] Taking Active Self  Erenumab-aooe (AIMOVIG) 70 MG/ML SOAJ [id number] No Inject 70 mg into the skin every 30 (thirty) days. [name], MD Taking Active   gabapentin (NEURONTIN) 600 MG tablet [id number] No Take 600 mg by mouth 4 (four) times daily. [provider] Taking Active Self  hydrOXYzine (ATARAX) 50 MG tablet [id number] No Take 50 mg by mouth 3 (three) times daily. [provider]  Taking Active   lactulose (CHRONULAC) 10 GM/15ML solution [id number]  Take 30 mLs (20 g total) by mouth 4 (four) times daily. [name], MD  Active   lisinopril (ZESTRIL) 40 MG tablet [id number] No TAKE ONE TABLET BY MOUTH EVERY EVENING Jerrol Banana., MD Taking Active   metoprolol tartrate (LOPRESSOR) 25 MG tablet [id number]  No Take 1 tablet (25 mg total) by mouth 2 (two) times daily. Jerrol Banana., MD Taking Active Self  naloxone [facility]) 4 MG/0.1ML LIQD nasal spray kit [id number] No Place 1 spray into the nose as needed (opioid overdose). [provider] Taking Active Self           Med Note Ivor Reining May 03, 2021 11:23 AM)    Naloxone HCl [facility]) [id number] No Inject 1 Dose as directed as needed (opioid overdose). [provider] Taking Active Self           Med Note Ivor Reining May 03, 2021 11:32 AM)    naproxen (NAPROSYN) 500 MG tablet [id number] No Take 500 mg by mouth 2 (two) times daily. [provider] Taking Active   omeprazole (PRILOSEC) 40 MG capsule [id number] No TAKE 1 CAPSULE BY MOUTH TWICE A DAY [name], MD Taking Active Self  ondansetron (ZOFRAN-ODT) 4 MG disintegrating tablet [id number] No Take 1 tablet (4 mg total) by mouth every 8 (eight) hours as needed for nausea or vomiting. [name], MD Taking Active   ondansetron (ZOFRAN-ODT) 8 MG disintegrating tablet [id number] No TAKE ONE TABLET BY MOUTH TWICE DAILY AS NEEDED [name], MD Taking Active   oxyCODONE HCl 15 MG TABA [id number] No Take 15 tablets by mouth 5 (five) times daily. [provider] Taking Active   Plecanatide (TRULANCE) 3 MG TABS [id number] No Take 1 tablet by mouth daily. [name], MD Taking Active Self           Med Note Redmond Baseman Jul 09, 2021  7:42 AM)    SPIRIVA RESPIMAT 2.5 MCG/ACT AERS [id number] No INHALE 1 PUFF BY MOUTH INTO LUNGS ONCE DAILY Bacigalupo, Dionne Bucy, MD Taking Active Self  sucralfate (CARAFATE) 1 GM/10ML suspension [id number] No TAKE 10 MLS (1 G TOTAL) BY MOUTH 4 (FOUR) TIMES DAILY. [name], MD Taking Active   SUMAtriptan [facility]) 25 MG tablet [id number] No May repeat in 2 hours if headache persists or recurs. [name], MD Taking Active   SYMBICORT 160-4.5 MCG/ACT inhaler [id number] No INHALE TWO PUFFS BY MOUTH INTO  LUNGS IN THE MORNING and AT BEDTIME Jerrol Banana., MD Taking Active   tiZANidine (ZANAFLEX) 4 MG tablet [id number] No Take 1 tablet (4 mg total) by mouth every 6 (six) hours as needed for muscle spasms. [name], MD Taking Active             Patient Active Problem List   Diagnosis Date Noted   Chronic migraine w/o aura w/o status migrainosus, not intractable 07/09/2021   Obstructive sleep apnea 07/09/2021   Body mass index (BMI) 32.0-32.9, adult 06/07/2021   Pseudoarthrosis of cervical spine ([place]) 05/14/2021   Lumbar radiculopathy 05/23/2020   Sacroiliac inflammation ([place]) 11/25/2019   Sepsis ([place]) 10/19/2018   Severe sepsis ([place]) 10/19/2018   Cervical radiculopathy 02/12/2018   Chronic migraine 02/12/2018   Paresthesia 08/29/2017   Neck pain 08/29/2017   Daily headache 08/29/2017   Chronic active hepatitis ([place]) 07/29/2017   Neurogenic pain 07/31/2016  Chronic pain syndrome 06/05/2016   Carotid artery stenosis 02/27/2016   Chronic constipation 02/27/2016   Chronic nausea 02/27/2016   Hypercholesterolemia 02/27/2016   Osteoarthritis 02/27/2016   PTSD (post-traumatic stress disorder) 02/27/2016   TIA (transient ischemic attack) 02/27/2016   Long term current use of opiate analgesic 02/27/2016   Long term prescription opiate use 02/27/2016   Encounter for pain management consult 02/27/2016   Chronic hip pain (Location of Tertiary source of pain) (Bilateral) (L>R) 02/27/2016   Chronic knee pain (Bilateral) (L>R) 02/27/2016   Chronic shoulder pain (Bilateral) (L>R) 02/27/2016   Chronic sacroiliac joint pain (Bilateral) (L>R) 02/27/2016   Chronic low back pain (Location of Primary Source of Pain) (Bilateral) (L>R) 02/27/2016   Chronic lower extremity pain (Location of Secondary source of pain) (Bilateral) (L>R) 02/27/2016   Osteoarthritis of hip (Bilateral) (L>R) 02/27/2016   Chronic neck pain (posterior midline) (Bilateral) (L>R) 02/27/2016   Cervicogenic headache  (Bilateral) (L>R) 02/27/2016   Occipital headache (Bilateral) (L>R) 02/27/2016   Chronic upper extremity pain (Bilateral) (L>R) 02/27/2016   Chronic Cervical radicular pain (Bilateral) (L>R) 02/27/2016   Chronic lumbar radicular pain (Right) (L5 dermatome) 02/27/2016   Lumbar facet syndrome (Bilateral) (L>R) 02/27/2016   Long term prescription benzodiazepine use 02/27/2016   Hypertension    Chronic obstructive pulmonary disease ([place]) 10/09/2015   GERD (gastroesophageal reflux disease) 10/09/2015   Non-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/2014 08/02/2015   Hyperlipidemia 08/02/2015   Costochondritis 08/02/2015   Drug abuse, 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[place]) 09/03/2014   GAD (generalized anxiety disorder) 09/03/2014   Narcotic dependence ([place]) 09/03/2014   PVD (peripheral vascular disease) ([place]) 09/03/2014   Smoker 09/03/2014   Cigarette nicotine dependence with nicotine-induced disorder 07/08/2014   Anemia of chronic disease 03/19/2014   Narcotic abuse, continuous ([place]) 03/19/2014   Polysubstance abuse ([place]) 03/19/2014   MSSA (methicillin susceptible Staphylococcus aureus) septicemia ([place]) 03/07/2014   Hypomagnesemia 01/04/2014   Chronic cough 09/04/2011   Sleep apnea, obstructive 09/04/2011   Sinusitis, acute 09/04/2011    Conditions to be addressed/monitored per PCP order: Chronic healthcare management needs, HTN, chronic pain, tobacco use, GERD, migraines, COPD, OSA, anxiety, PVD, constipation.  Care Plan : General Plan of Care (Adult)  Updates made by [name], RN since 07/27/2021 12:00 AM     Problem: Health Promotion or Disease Self-Management (General Plan of Care)   Priority: High  Onset Date: 12/20/2020     Long-Range Goal: Self-Management Plan Developed   Start Date: 12/20/2020  Expected End Date: 10/24/2021  Recent Progress: Not on track  Priority: High  Note:    Current Barriers:  Knowledge Deficits related to short term plan for care coordination needs and long term plans for chronic  disease management needs.  Patient with many chronic health conditions. 07/27/21:  Patient complaining of continued neck pain today-little to no relief with pain meds.  Has appt for CT 08/06/21.  Continues to smoke 1/2 ppd. Nurse Case Manager Clinical Goal(s):  patient will work with care management team to address care coordination and chronic disease management needs related to Disease Management Educational Needs Care Coordination Medication Management and Education Medication Reconciliation Medication Assistance  Psychosocial Support Mental Health Counseling   Interventions:  Evaluation of current treatment plan  and patient's adherence to plan as established by provider. Reviewed medications with patient. Collaborated with pharmacy regarding medications.Marland Kitchen  Collaborated with SW regarding anxiety. Discussed plans with patient for ongoing care management follow up and provided patient with direct contact information for care management team  Advised patient, providing education and rationale, to monitor blood pressure daily and record, calling PCP for findings outside established parameters.  Reviewed scheduled/upcoming provider appointments. Social Work referral for anxiety, PTSD-completed Pharmacy referral for medication review-completed. Patient given 1-800-QUITNOW for smoking cessation if interested.  Self Care Activities:  Patient will self administer medications as prescribed Patient will attend all scheduled provider appointments Patient will call pharmacy for medication refills Patient will continue to perform ADL's independently Patient will call provider office for new concerns or questions Patient Goals: In the next 90 days, patient will attend all scheduled appointments. - Follow Up Plan: The patient has been provided with contact information for the care management team and has been advised to call with any health related questions or concerns.  The care management team  will reach out to the patient again over the next 30 days.  Evidence-based guidance:   Review biopsychosocial determinants of health screens.  Review need for preventive screening based on age, sex, family history and health history.  Determine level of modifiable health risk.   Discuss identified risks.  Identify areas where behavior change may lead to improved health.  Promote healthy lifestyle.  Evoke change talk using open-ended questions, pros and cons, as well as looking forward.  Identify and manage conditions or preconditions to reduce health risk.  Implement additional goals and interventions based on identified risk factors.    Follow Up:  Patient agrees to Care Plan and Follow-up.  Plan: The Managed Medicaid care management team will reach out to the patient again over the next 30 days. and The  Patient has been provided with contact information for the Managed Medicaid care management team and has been advised to call with any health related questions or concerns.  Date/time of next scheduled RN care management/care coordination outreach:  08/21/21 at 330.

## 2021-07-27 NOTE — Patient Instructions (Signed)
Hi Sara Munoz, thanks for speaking with me-I hope you feel better!  Sara Munoz was given information about Medicaid Managed Care team care coordination services as a part of their [place] Complete Medicaid benefit. Sara Munoz verbally consented to engagement with the [facility] Managed Care team.   If you are experiencing a medical emergency, please call 911 or report to your local emergency department or urgent care.   If you have a non-emergency medical problem during routine business hours, please contact your provider's office and ask to speak with a nurse.   For questions related to your [place] Complete Medicaid health plan, please call: [phone]  If you would like to schedule transportation through your [place] Complete Medicaid plan, please call the following number at least 2 days in advance of your appointment: [phone].   Call the [place] at [phone], at any time, 24 hours a day, 7 days a week. If you are in danger or need immediate medical attention call 911.  If you would like help to quit smoking, call 1-800-QUIT-NOW 636-694-6639) OR Espaol: 1-855-Djelo-Ya (6-270-350-0938) o para ms informacin haga clic aqu or Text READY to 200-400 to register via text  Sara Munoz - following are the goals we discussed in your visit today:   Timeframe:  Long-Range Goal Priority:  High Start Date:       12/20/20                      Expected End Date:    ongoing                   Follow Up Date: 08/24/21   - schedule appointment for flu shot - schedule appointment for vaccines needed due to my age or health - schedule recommended health tests (blood work, mammogram, colonoscopy, pap test) - schedule and keep appointment for annual check-up   07/27/21:  Patient to have EGD CT on 08/06/21.   Why is this important?   Screening tests can find diseases early when they are easier to treat.  Your doctor or nurse will talk with you about which tests  are important for you.  Getting shots for common diseases like the flu and shingles will help prevent them.    The patient verbalized understanding of instructions provided today and declined a print copy of patient instruction materials.   The Managed Medicaid care management team will reach out to the patient again over the next 30 days.  The  Patient has been provided with contact information for the Managed Medicaid care management team and has been advised to call with any health related questions or concerns.   [name] RN, BSN [place] Management Coordinator - Managed Medicaid High Risk [phone]   Following is a copy of your plan of care:  Care Plan : General Plan of Care (Adult)  Updates made by [name], RN since 07/27/2021 12:00 AM     Problem: Health Promotion or Disease Self-Management (General Plan of Care)   Priority: High  Onset Date: 12/20/2020     Long-Range Goal: Self-Management Plan Developed   Start Date: 12/20/2020  Expected End Date: 10/24/2021  Recent Progress: Not on track  Priority: High  Note:    Current Barriers:  Knowledge Deficits related to short term plan for care coordination needs and long term plans for chronic disease management needs.  Patient with many chronic health conditions. 07/27/21:  Patient  complaining of continued neck pain today-little to no relief with pain meds.  Has appt for CT 08/06/21.  Continues to smoke 1/2 ppd. Nurse Case Manager Clinical Goal(s):  patient will work with care management team to address care coordination and chronic disease management needs related to Disease Management Educational Needs Care Coordination Medication Management and Education Medication Reconciliation Medication Assistance  Psychosocial Support Mental Health Counseling   Interventions:  Evaluation of current treatment plan  and patient's adherence to plan as established by provider. Reviewed  medications with patient. Collaborated with pharmacy regarding medications.Marland Kitchen  Collaborated with SW regarding anxiety. Discussed plans with patient for ongoing care management follow up and provided patient with direct contact information for care management team Advised patient, providing education and rationale, to monitor blood pressure daily and record, calling PCP for findings outside established parameters.  Reviewed scheduled/upcoming provider appointments. Social Work referral for anxiety, PTSD-completed Pharmacy referral for medication review-completed. Patient given 1-800-QUITNOW for smoking cessation if interested.  Self Care Activities:  Patient will self administer medications as prescribed Patient will attend all scheduled provider appointments Patient will call pharmacy for medication refills Patient will continue to perform ADL's independently Patient will call provider office for new concerns or questions Patient Goals: In the next 90 days, patient will attend all scheduled appointments. - Follow Up Plan: The patient has been provided with contact information for the care management team and has been advised to call with any health related questions or concerns.  The care management team will reach out to the patient again over the next 30 days.  Evidence-based guidance:   Review biopsychosocial determinants of health screens.  Review need for preventive screening based on age, sex, family history and health history.  Determine level of modifiable health risk.   Discuss identified risks.  Identify areas where behavior change may lead to improved health.  Promote healthy lifestyle.  Evoke change talk using open-ended questions, pros and cons, as well as looking forward.  Identify and manage conditions or preconditions to reduce health risk.  Implement additional goals and interventions based on identified risk factors.

## 2021-07-30 ENCOUNTER — Encounter: Admission: RE | Payer: Self-pay | Source: Home / Self Care

## 2021-07-30 ENCOUNTER — Ambulatory Visit: Admission: RE | Admit: 2021-07-30 | Payer: Medicaid Other | Source: Home / Self Care | Admitting: Gastroenterology

## 2021-07-30 ENCOUNTER — Other Ambulatory Visit: Payer: Self-pay | Admitting: Gastroenterology

## 2021-07-30 SURGERY — ESOPHAGOGASTRODUODENOSCOPY (EGD) WITH PROPOFOL
Anesthesia: General

## 2021-07-31 DIAGNOSIS — M79622 Pain in left upper arm: Secondary | ICD-10-CM | POA: Diagnosis not present

## 2021-07-31 DIAGNOSIS — G890 Central pain syndrome: Secondary | ICD-10-CM | POA: Diagnosis not present

## 2021-07-31 DIAGNOSIS — G894 Chronic pain syndrome: Secondary | ICD-10-CM | POA: Diagnosis not present

## 2021-07-31 DIAGNOSIS — M25511 Pain in right shoulder: Secondary | ICD-10-CM | POA: Diagnosis not present

## 2021-07-31 DIAGNOSIS — M545 Low back pain, unspecified: Secondary | ICD-10-CM | POA: Diagnosis not present

## 2021-07-31 DIAGNOSIS — M542 Cervicalgia: Secondary | ICD-10-CM | POA: Diagnosis not present

## 2021-07-31 DIAGNOSIS — M79662 Pain in left lower leg: Secondary | ICD-10-CM | POA: Diagnosis not present

## 2021-07-31 DIAGNOSIS — M79661 Pain in right lower leg: Secondary | ICD-10-CM | POA: Diagnosis not present

## 2021-07-31 DIAGNOSIS — Z79891 Long term (current) use of opiate analgesic: Secondary | ICD-10-CM | POA: Diagnosis not present

## 2021-07-31 DIAGNOSIS — M79621 Pain in right upper arm: Secondary | ICD-10-CM | POA: Diagnosis not present

## 2021-07-31 DIAGNOSIS — M25512 Pain in left shoulder: Secondary | ICD-10-CM | POA: Diagnosis not present

## 2021-08-06 ENCOUNTER — Other Ambulatory Visit: Payer: Medicaid Other

## 2021-08-07 ENCOUNTER — Ambulatory Visit: Payer: Medicaid Other | Admitting: Neurology

## 2021-08-13 ENCOUNTER — Ambulatory Visit
Admission: RE | Admit: 2021-08-13 | Discharge: 2021-08-13 | Disposition: A | Discharge status: Home / Self Care | Payer: Medicaid Other | Source: Ambulatory Visit | Attending: Neurological Surgery | Admitting: Neurological Surgery

## 2021-08-13 DIAGNOSIS — S129XXA Fracture of neck, unspecified, initial encounter: Secondary | ICD-10-CM

## 2021-08-13 DIAGNOSIS — M542 Cervicalgia: Secondary | ICD-10-CM | POA: Diagnosis not present

## 2021-08-13 DIAGNOSIS — R200 Anesthesia of skin: Secondary | ICD-10-CM | POA: Diagnosis not present

## 2021-08-13 DIAGNOSIS — M2578 Osteophyte, vertebrae: Secondary | ICD-10-CM | POA: Diagnosis not present

## 2021-08-13 DIAGNOSIS — R202 Paresthesia of skin: Secondary | ICD-10-CM | POA: Diagnosis not present

## 2021-08-14 ENCOUNTER — Other Ambulatory Visit: Payer: Self-pay

## 2021-08-14 NOTE — Patient Outreach (Signed)
Care Coordination  08/14/2021  SHARESA KEMP 1965/10/25 [id number]  BSW completed telephone outreach with patient. She states she is doing well and still seeing her therapist. Patient states she would like to speak with LCSW upon her return. Patient stated no resources are needed at this time.   Mickel Fuchs, BSW, [place] Managed Medicaid Team  [phone]

## 2021-08-14 NOTE — Patient Instructions (Signed)
Thank you for taking time to speak with me today about care coordination and care management services available to you at no cost as part of your Medicaid benefit. These services are voluntary. Our team is available to provide assistance regarding your health care needs at any time. Please do not hesitate to reach out to me if we can be of service to you at any time in the future.  Sabrea Sankey, BSW, MHA [facility]  [place]  High Risk Managed Medicaid Team  [phone]   

## 2021-08-20 ENCOUNTER — Other Ambulatory Visit: Payer: Self-pay | Admitting: Gastroenterology

## 2021-08-21 ENCOUNTER — Other Ambulatory Visit: Payer: Self-pay

## 2021-08-21 ENCOUNTER — Other Ambulatory Visit: Payer: Self-pay | Admitting: Obstetrics and Gynecology

## 2021-08-21 NOTE — Patient Outreach (Signed)
Medicaid Managed Care   Nurse Care Manager Note  08/21/2021 Name:  Sara Munoz MRN:  [id number] DOB:  11/07/1965  Sara Munoz is an 56 y.o. year old female who is a primary patient of [name], [name] (Inactive).  The [facility] Managed Care Coordination team was consulted for assistance with:    Chronic healthcare management needs, tobacco use, HTN, chronic pain, GERD, migraines, COPD, OSA, anxiety, CAD  Sara Munoz was given information about Medicaid Managed Care Coordination team services today. Sara Munoz Patient agreed to services and verbal consent obtained.  Engaged with patient by telephone for follow up visit in response to provider referral for case management and/or care coordination services.   Assessments/Interventions:  Review of past medical history, allergies, medications, health status, including review of consultants reports, laboratory and other test data, was performed as part of comprehensive evaluation and provision of chronic care management services.  SDOH (Social Determinants of Health) assessments and interventions performed: SDOH Interventions    Flowsheet Row Most Recent Value  SDOH Interventions   Intimate Partner Violence Interventions Intervention Not Indicated      Care Plan  Allergies  Allergen Reactions   Flexeril [Cyclobenzaprine] Hives and Swelling    Facial/lip swelling      Levofloxacin Hives and Swelling   Phenergan [Promethazine Hcl] Other (See Comments)    Agitation.    Toradol [Ketorolac Tromethamine] Swelling and Other (See Comments)    Facial/tongue swelling    Tramadol Hives, Swelling and Other (See Comments)    Lip swelling    Zoloft [Sertraline Hcl] Swelling    Tongue swelling       Medications Reviewed Today     Reviewed by Sara Medicus, RN (Registered Nurse) on 08/21/21 at 1544  Med List Status: <None>   Medication Order Taking? Sig Documenting Provider Last Dose Status Informant  albuterol (PROVENTIL)  (2.5 MG/3ML) 0.083% nebulizer solution [id number] No Take 3 mLs (2.5 mg total) by nebulization every 6 (six) hours as needed for wheezing or shortness of breath. [name], MD Taking Active Self  albuterol (VENTOLIN HFA) 108 (90 Base) MCG/ACT inhaler [id number] No INHALE TWO PUFFS BY MOUTH INTO LUNGS EVERY 6 HOURS AS NEEDED FOR SHORTNESS OF BREATH OR wheezing Jerrol Banana., MD Taking Active   amLODipine [facility]) 5 MG tablet [id number] No TAKE ONE TABLET BY MOUTH ONCE DAILY Jerrol Banana., MD Taking Active   atorvastatin (LIPITOR) 20 MG tablet [id number] No TAKE ONE TABLET BY MOUTH EVERY EVENING Jerrol Banana., MD Taking Active   docusate sodium (COLACE) 100 MG capsule [id number] No Take 100 mg by mouth 2 (two) times daily. [provider] Taking Active Self  doxycycline (VIBRA-TABS) 100 MG tablet [id number] No Take 1 tablet by mouth in the morning and at bedtime. [provider] Taking Active   DULoxetine (CYMBALTA) 30 MG capsule [id number] No Take 30 mg by mouth 2 (two) times daily. [provider] Taking Active Self  Erenumab-aooe (AIMOVIG) 70 MG/ML SOAJ [id number] No Inject 70 mg into the skin every 30 (thirty) days. [name], MD Taking Active   gabapentin (NEURONTIN) 600 MG tablet [id number] No Take 600 mg by mouth 4 (four) times daily. [provider] Taking Active Self  hydrOXYzine (ATARAX) 50 MG tablet [id number] No Take 50 mg by mouth 3 (three) times daily. [provider] Taking Active   lactulose (CHRONULAC) 10 GM/15ML solution [id number]  Take 30 mLs (20 g total) by mouth 4 (  four) times daily. [name], MD  Active   lisinopril (ZESTRIL) 40 MG tablet [id number] No TAKE ONE TABLET BY MOUTH EVERY EVENING Jerrol Banana., MD Taking Active   metoprolol tartrate (LOPRESSOR) 25 MG tablet [id number] No Take 1 tablet (25 mg total) by mouth 2 (two) times daily. Jerrol Banana., MD Taking Active Self  naloxone [facility])  4 MG/0.1ML LIQD nasal spray kit [id number] No Place 1 spray into the nose as needed (opioid overdose). [provider] Taking Active Self           Med Note Ivor Reining May 03, 2021 11:23 AM)    Naloxone HCl [facility]) [id number] No Inject 1 Dose as directed as needed (opioid overdose). [provider] Taking Active Self           Med Note Ivor Reining May 03, 2021 11:32 AM)    naproxen (NAPROSYN) 500 MG tablet [id number] No Take 500 mg by mouth 2 (two) times daily. [provider] Taking Active   omeprazole (PRILOSEC) 40 MG capsule [id number] No TAKE 1 CAPSULE BY MOUTH TWICE A DAY [name], MD Taking Active Self  ondansetron (ZOFRAN-ODT) 4 MG disintegrating tablet [id number] No Take 1 tablet (4 mg total) by mouth every 8 (eight) hours as needed for nausea or vomiting. [name], MD Taking Active   ondansetron (ZOFRAN-ODT) 8 MG disintegrating tablet [id number] No TAKE ONE TABLET BY MOUTH TWICE DAILY AS NEEDED [name], MD Taking Active   oxyCODONE HCl 15 MG TABA [id number] No Take 15 tablets by mouth 5 (five) times daily. [provider] Taking Active   Plecanatide (TRULANCE) 3 MG TABS [id number] No Take 1 tablet by mouth daily. [name], MD Taking Active Self           Med Note Redmond Baseman Jul 09, 2021  7:42 AM)    SPIRIVA RESPIMAT 2.5 MCG/ACT AERS [id number] No INHALE 1 PUFF BY MOUTH INTO LUNGS ONCE DAILY Bacigalupo, Dionne Bucy, MD Taking Active Self  sucralfate (CARAFATE) 1 GM/10ML suspension [id number]  TAKE 10 MLS (1 G TOTAL) BY MOUTH 4 (FOUR) TIMES DAILY. [name], MD  Active   SUMAtriptan [facility]) 25 MG tablet [id number] No May repeat in 2 hours if headache persists or recurs. [name], MD Taking Active   SYMBICORT 160-4.5 MCG/ACT inhaler [id number] No INHALE TWO PUFFS BY MOUTH INTO LUNGS IN THE MORNING and AT BEDTIME Jerrol Banana., MD Taking Active   tiZANidine (ZANAFLEX) 4 MG tablet [id number] No Take 1  tablet (4 mg total) by mouth every 6 (six) hours as needed for muscle spasms. [name], MD Taking Active             Patient Active Problem List   Diagnosis Date Noted   Chronic migraine w/o aura w/o status migrainosus, not intractable 07/09/2021   Obstructive sleep apnea 07/09/2021   Body mass index (BMI) 32.0-32.9, adult 06/07/2021   Pseudoarthrosis of cervical spine ([place]) 05/14/2021   Lumbar radiculopathy 05/23/2020   Sacroiliac inflammation ([place]) 11/25/2019   Sepsis ([place]) 10/19/2018   Severe sepsis ([place]) 10/19/2018   Cervical radiculopathy 02/12/2018   Chronic migraine 02/12/2018   Paresthesia 08/29/2017   Neck pain 08/29/2017   Daily headache 08/29/2017   Chronic active hepatitis ([place]) 07/29/2017   Neurogenic pain 07/31/2016   Chronic pain syndrome 06/05/2016   Carotid artery stenosis 02/27/2016   Chronic constipation 02/27/2016   Chronic nausea  02/27/2016   Hypercholesterolemia 02/27/2016   Osteoarthritis 02/27/2016   PTSD (post-traumatic stress disorder) 02/27/2016   TIA (transient ischemic attack) 02/27/2016   Long term current use of opiate analgesic 02/27/2016   Long term prescription opiate use 02/27/2016   Encounter for pain management consult 02/27/2016   Chronic hip pain (Location of Tertiary source of pain) (Bilateral) (L>R) 02/27/2016   Chronic knee pain (Bilateral) (L>R) 02/27/2016   Chronic shoulder pain (Bilateral) (L>R) 02/27/2016   Chronic sacroiliac joint pain (Bilateral) (L>R) 02/27/2016   Chronic low back pain (Location of Primary Source of Pain) (Bilateral) (L>R) 02/27/2016   Chronic lower extremity pain (Location of Secondary source of pain) (Bilateral) (L>R) 02/27/2016   Osteoarthritis of hip (Bilateral) (L>R) 02/27/2016   Chronic neck pain (posterior midline) (Bilateral) (L>R) 02/27/2016   Cervicogenic headache (Bilateral) (L>R) 02/27/2016   Occipital headache (Bilateral) (L>R) 02/27/2016   Chronic upper extremity pain (Bilateral) (L>R)  02/27/2016   Chronic Cervical radicular pain (Bilateral) (L>R) 02/27/2016   Chronic lumbar radicular pain (Right) (L5 dermatome) 02/27/2016   Lumbar facet syndrome (Bilateral) (L>R) 02/27/2016   Long term prescription benzodiazepine use 02/27/2016   Hypertension    Chronic obstructive pulmonary disease ([place]) 10/09/2015   GERD (gastroesophageal reflux disease) 10/09/2015   Non-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/2014 08/02/2015   Hyperlipidemia 08/02/2015   Costochondritis 08/02/2015   Drug abuse, 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[place]) 09/03/2014   GAD (generalized anxiety disorder) 09/03/2014   Narcotic dependence ([place]) 09/03/2014   PVD (peripheral vascular disease) ([place]) 09/03/2014   Smoker 09/03/2014   Cigarette nicotine dependence with nicotine-induced disorder 07/08/2014   Anemia of chronic disease 03/19/2014   Narcotic abuse, continuous ([place]) 03/19/2014   Polysubstance abuse ([place]) 03/19/2014   MSSA (methicillin susceptible Staphylococcus aureus) septicemia ([place]) 03/07/2014   Hypomagnesemia 01/04/2014   Chronic cough 09/04/2011   Sleep apnea, obstructive 09/04/2011   Sinusitis, acute 09/04/2011   Conditions to be addressed/monitored per PCP order:  Chronic healthcare management needs, tobacco use, HTN, chronic pain, GERD, migraines, COPD, OSA, anxiety, CAD  Care Plan : General Plan of Care (Adult)  Updates made by Sara Medicus, RN since 08/21/2021 12:00 AM     Problem: Health Promotion or Disease Self-Management (General Plan of Care)   Priority: High  Onset Date: 12/20/2020     Long-Range Goal: Self-Management Plan Developed   Start Date: 12/20/2020  Expected End Date: 10/24/2021  Recent Progress: Not on track  Priority: High  Note:    Current Barriers:  Knowledge Deficits related to short term plan for care coordination needs and long term plans for chronic disease management needs.  Patient with many chronic health conditions. 08/21/21:  Patient with no change in neck pain, has appt with surgeon this  week, pain provider next week and neuro studies 08/29/21.  No change in smoking, 1/2 ppd Nurse Case Manager Clinical Goal(s):  patient will work with care management team to address care coordination and chronic disease management needs related to Disease Management Educational Needs Care Coordination Medication Management and Education Medication Reconciliation Medication Assistance  Psychosocial Support Mental Health Counseling   Interventions:  Evaluation of current treatment plan  and patient's adherence to plan as established by provider. Reviewed medications with patient. Collaborated with pharmacy regarding medications.Marland Kitchen  Collaborated with SW regarding anxiety. Discussed plans with patient for ongoing care management follow up and provided patient with direct contact information for care management team Advised patient, providing education and rationale, to monitor blood pressure daily and record, calling PCP for findings  outside established parameters.  Reviewed scheduled/upcoming provider appointments. Social Work referral for anxiety, PTSD-completed Pharmacy referral for medication review-completed. Patient given 1-800-QUITNOW for smoking cessation if interested.  Self Care Activities:  Patient will self administer medications as prescribed Patient will attend all scheduled provider appointments Patient will call pharmacy for medication refills Patient will continue to perform ADL's independently Patient will call provider office for new concerns or questions Patient Goals: In the next 90 days, patient will attend all scheduled appointments. - Follow Up Plan: The patient has been provided with contact information for the care management team and has been advised to call with any health related questions or concerns.  The care management team will reach out to the patient again over the next 30 days.  Implement additional goals and interventions based on identified risk  factors.    Follow Up:  Patient agrees to Care Plan and Follow-up.  Plan: The Managed Medicaid care management team will reach out to the patient again over the next 30 days. and The  Patient has been provided with contact information for the Managed Medicaid care management team and has been advised to call with any health related questions or concerns.  Date/time of next scheduled RN care management/care coordination outreach:  09/17/21 at 230.

## 2021-08-21 NOTE — Patient Instructions (Signed)
Hi Ms. Romas to speak to you today-have a nice afternoon and I hope you feel better.  Ms. Goswami was given information about Medicaid Managed Care team care coordination services as a part of their [place] Complete Medicaid benefit. [name] verbally consented to engagement with the [facility] Managed Care team.   If you are experiencing a medical emergency, please call 911 or report to your local emergency department or urgent care.   If you have a non-emergency medical problem during routine business hours, please contact your provider's office and ask to speak with a nurse.   For questions related to your [place] Complete Medicaid health plan, please call: [phone]  If you would like to schedule transportation through your [place] Complete Medicaid plan, please call the following number at least 2 days in advance of your appointment: [phone].   There is no limit to the number of trips during the year between medical appointments, healthcare facilities, or pharmacies. Transportation must be scheduled at least 2 business days before but not more than thirty 30 days before of your appointment.  Call the [place] at [phone], at any time, 24 hours a day, 7 days a week. If you are in danger or need immediate medical attention call 911.  If you would like help to quit smoking, call 1-800-QUIT-NOW 858-481-3074) OR Espaol: 1-855-Djelo-Ya (1-245-809-9833) o para ms informacin haga clic aqu or Text READY to 200-400 to register via text  [name] - following are the goals we discussed in your visit today:  Timeframe:  Long-Range Goal Priority:  High Start Date:       12/20/20                      Expected End Date:    ongoing                   Follow Up Date: 09/18/21   - schedule appointment for flu shot - schedule appointment for vaccines needed due to my age or health - schedule recommended health tests (blood work, mammogram,  colonoscopy, pap test) - schedule and keep appointment for annual check-up 08/21/21:  Upcoming appts: has appt with surgeon this week, 3/7 pain provider, neuro studies 3/8   Why is this important?   Screening tests can find diseases early when they are easier to treat.  Your doctor or nurse will talk with you about which tests are important for you.  Getting shots for common diseases like the flu and shingles will help prevent them.    Patient verbalizes understanding of instructions and care plan provided today and agrees to view in [place]. Active MyChart status confirmed with patient.    The Managed Medicaid care management team will reach out to the patient again over the next 30 days.  The  Patient has been provided with contact information for the Managed Medicaid care management team and has been advised to call with any health related questions or concerns.   [name] RN, BSN [facility] Management Coordinator - Managed Medicaid High Risk [phone]   Following is a copy of your plan of care:  Care Plan : General Plan of Care (Adult)  Updates made by [name], RN since 08/21/2021 12:00 AM     Problem: Health Promotion or Disease Self-Management (General Plan of Care)   Priority: High  Onset Date: 12/20/2020     Long-Range Goal: Self-Management Plan Developed  Start Date: 12/20/2020  Expected End Date: 10/24/2021  Recent Progress: Not on track  Priority: High  Note:    Current Barriers:  Knowledge Deficits related to short term plan for care coordination needs and long term plans for chronic disease management needs.  Patient with many chronic health conditions. 08/21/21:  Patient with no change in neck pain, has appt with surgeon this week, pain provider next week and neuro studies 08/29/21.  No change in smoking, 1/2 ppd Nurse Case Manager Clinical Goal(s):  patient will work with care management team to address care coordination and  chronic disease management needs related to Disease Management Educational Needs Care Coordination Medication Management and Education Medication Reconciliation Medication Assistance  Psychosocial Support Mental Health Counseling   Interventions:  Evaluation of current treatment plan  and patient's adherence to plan as established by provider. Reviewed medications with patient. Collaborated with pharmacy regarding medications.Marland Kitchen  Collaborated with SW regarding anxiety. Discussed plans with patient for ongoing care management follow up and provided patient with direct contact information for care management team Advised patient, providing education and rationale, to monitor blood pressure daily and record, calling PCP for findings outside established parameters.  Reviewed scheduled/upcoming provider appointments. Social Work referral for anxiety, PTSD-completed Pharmacy referral for medication review-completed. Patient given 1-800-QUITNOW for smoking cessation if interested.  Self Care Activities:  Patient will self administer medications as prescribed Patient will attend all scheduled provider appointments Patient will call pharmacy for medication refills Patient will continue to perform ADL's independently Patient will call provider office for new concerns or questions Patient Goals: In the next 90 days, patient will attend all scheduled appointments. - Follow Up Plan: The patient has been provided with contact information for the care management team and has been advised to call with any health related questions or concerns.  The care management team will reach out to the patient again over the next 30 days.  Implement additional goals and interventions based on identified risk factors.

## 2021-08-28 DIAGNOSIS — Z79891 Long term (current) use of opiate analgesic: Secondary | ICD-10-CM | POA: Diagnosis not present

## 2021-08-28 DIAGNOSIS — M79621 Pain in right upper arm: Secondary | ICD-10-CM | POA: Diagnosis not present

## 2021-08-28 DIAGNOSIS — M25512 Pain in left shoulder: Secondary | ICD-10-CM | POA: Diagnosis not present

## 2021-08-28 DIAGNOSIS — G894 Chronic pain syndrome: Secondary | ICD-10-CM | POA: Diagnosis not present

## 2021-08-28 DIAGNOSIS — M79661 Pain in right lower leg: Secondary | ICD-10-CM | POA: Diagnosis not present

## 2021-08-28 DIAGNOSIS — M79622 Pain in left upper arm: Secondary | ICD-10-CM | POA: Diagnosis not present

## 2021-08-28 DIAGNOSIS — M545 Low back pain, unspecified: Secondary | ICD-10-CM | POA: Diagnosis not present

## 2021-08-28 DIAGNOSIS — M542 Cervicalgia: Secondary | ICD-10-CM | POA: Diagnosis not present

## 2021-08-28 DIAGNOSIS — M79662 Pain in left lower leg: Secondary | ICD-10-CM | POA: Diagnosis not present

## 2021-08-28 DIAGNOSIS — M25511 Pain in right shoulder: Secondary | ICD-10-CM | POA: Diagnosis not present

## 2021-08-29 ENCOUNTER — Encounter: Payer: Medicaid Other | Admitting: Neurology

## 2021-09-03 ENCOUNTER — Ambulatory Visit (INDEPENDENT_AMBULATORY_CARE_PROVIDER_SITE_OTHER): Payer: Medicaid Other | Admitting: Neurology

## 2021-09-03 ENCOUNTER — Ambulatory Visit: Payer: Medicaid Other | Admitting: Neurology

## 2021-09-03 DIAGNOSIS — G43709 Chronic migraine without aura, not intractable, without status migrainosus: Secondary | ICD-10-CM

## 2021-09-03 DIAGNOSIS — G4733 Obstructive sleep apnea (adult) (pediatric): Secondary | ICD-10-CM

## 2021-09-03 DIAGNOSIS — M542 Cervicalgia: Secondary | ICD-10-CM

## 2021-09-03 DIAGNOSIS — G5603 Carpal tunnel syndrome, bilateral upper limbs: Secondary | ICD-10-CM

## 2021-09-03 DIAGNOSIS — R202 Paresthesia of skin: Secondary | ICD-10-CM | POA: Diagnosis not present

## 2021-09-03 DIAGNOSIS — Z0289 Encounter for other administrative examinations: Secondary | ICD-10-CM

## 2021-09-03 NOTE — Progress Notes (Signed)
EMG nerve conduction study on September 03, 2021 showed bilateral carpal tunnel syndromes, right side is moderate, left side is mild.

## 2021-09-03 NOTE — Procedures (Signed)
? ? ? ?   ?Full Name: Sara Munoz Gender: Female ?MRN #: [id number] Date of Birth: 07-15-1965 ?   ?Visit Date: 09/03/2021 10:58 ?Age: 56 Years ?Examining Physician: [name], MD  ?Referring Physician: [name], MD ?Height: 5 feet 4 inch ?Patient History: 56 year old female complains of intermittent bilateral hands paresthesia ? ?Summary of the test: ? ?Nerve conduction study: ? ?Bilateral ulnar sensory and motor responses were normal. ? ?Right median sensory response showed mildly prolonged peak latency, with normal snap amplitude.  Right median motor response showed moderately prolonged distal latency, with significantly decreased CMAP amplitude. ? ?Left median sensory response were within normal limit, but left median mixed response was 0.5 ms prolonged compared to left ulnar mixed response. ? ?Left median motor responses were within normal limit. ? ?Electromyography: ? ?Selected needle examinations of bilateral upper extremity muscles and cervical paraspinal muscles showed no significant abnormality, other than mildly decreased recruitment patterns at the right abductor pollicis brevis.  This is ?   ?Conclusion: ?This is an abnormal study.  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  There is no evidence of bilateral cervical radiculopathy. ? ? ? ?------------------------------- ?[name] M.D. PhD ?[facility] ?[address], [address] ?[place], [place] [address] ?Tel: [phone] ?Fax: [phone] ? ?Verbal informed consent was obtained from the patient, patient was informed of potential risk of procedure, including bruising, bleeding, hematoma formation, infection, muscle weakness, muscle pain, numbness, among others. ?   ? ?   ?[place] ?   ?Nerve / Sites Muscle Latency Ref. Amplitude Ref. Rel Amp Segments Distance Velocity Ref. Area  ?  ms ms mV mV %  cm m/s m/s mVms  ?R Median - APB  ?   Wrist  APB 5.5 ?4.4 1.6 ?4.0 100 Wrist - APB 7   6.3  ?   Upper arm APB 9.1  1.6  99 Upper arm - Wrist 18 50 ?49 7.5  ?L Median - APB  ?   Wrist APB 4.1 ?4.4 7.5 ?4.0 100 Wrist - APB 7   21.9  ?   Upper arm APB 7.3  7.0  93.6 Upper arm - Wrist 19 58 ?49 20.3  ?R Ulnar - ADM  ?   Wrist ADM 2.8 ?3.3 10.0 ?6.0 100 Wrist - ADM 7   30.0  ?   B.Elbow ADM 5.4  10.0  99.5 B.Elbow - Wrist 17 66 ?49 29.6  ?   A.Elbow ADM 7.0  9.6  96.4 A.Elbow - B.Elbow 10 61 ?49 29.8  ?L Ulnar - ADM  ?   Wrist ADM 3.2 ?3.3 11.7 ?6.0 100 Wrist - ADM 7   32.1  ?   B.Elbow ADM 6.1  11.4  97.4 B.Elbow - Wrist 17 58 ?49 30.1  ?   A.Elbow ADM 7.9  10.9  96 A.Elbow - B.Elbow 10 57 ?49 29.0  ?           ?[place] ?   ?Nerve / Sites Rec. Site Peak Lat Ref.  Amp Ref. Segments Distance Peak Diff Ref.  ?  ms ms ?V ?V  cm ms ms  ?L Median, Ulnar - Transcarpal comparison  ?   Median Palm Wrist 2.3 ?2.2 51 ?35 Median Palm - Wrist 8    ?   Ulnar Palm Wrist 1.8 ?2.2 20 ?12 Ulnar Palm - Wrist 8    ?      Median  Palm - Ulnar Palm  0.5 ?0.4  ?R Median - Orthodromic (Dig II, Mid palm)  ?   Dig II Wrist 3.7 ?3.4 10 ?10 Dig II - Wrist 13    ?L Median - Orthodromic (Dig II, Mid palm)  ?   Dig II Wrist 3.2 ?3.4 10 ?10 Dig II - Wrist 13    ?R Ulnar - Orthodromic, (Dig V, Mid palm)  ?   Dig V Wrist 2.6 ?3.1 8 ?5 Dig V - Wrist 11    ?L Ulnar - Orthodromic, (Dig V, Mid palm)  ?   Dig V Wrist 2.8 ?3.1 6 ?5 Dig V - Wrist 11    ?             ?F  Wave ?   ?Nerve F Lat Ref.  ? ms ms  ?R Ulnar - ADM 26.1 ?32.0  ?L Ulnar - ADM 27.0 ?32.0  ?       ?EMG Summary Table   ? Spontaneous MUAP Recruitment  ?Muscle IA Fib PSW Fasc Other Amp Dur. Poly Pattern  ?R. First dorsal interosseous Normal None None None _______ Normal Normal Normal Normal  ?R. Abductor pollicis brevis Normal None None None _______ Normal Normal Normal Reduced  ?R. Pronator teres Normal None None None _______ Normal Normal Normal Normal  ?R. Biceps brachii Normal None None None _______ Normal Normal Normal Normal  ?R.  Deltoid Normal None None None _______ Normal Normal Normal Normal  ?L. First dorsal interosseous Normal None None None _______ Normal Normal Normal Normal  ?L. Extensor digitorum communis Normal None None None _______ Normal Normal Normal Normal  ?L. Biceps brachii Normal None None None _______ Normal Normal Normal Normal  ?L. Deltoid Normal None None None _______ Normal Normal Normal Normal  ?L. Pronator teres Normal None None None _______ Normal Normal Normal Normal  ?R. Cervical paraspinals Normal None None None _______ Normal Normal Normal Normal  ?L. Cervical paraspinals Normal None None None _______ Normal Normal Normal Normal  ? ?  ?

## 2021-09-05 DIAGNOSIS — M5412 Radiculopathy, cervical region: Secondary | ICD-10-CM | POA: Diagnosis not present

## 2021-09-05 DIAGNOSIS — G5600 Carpal tunnel syndrome, unspecified upper limb: Secondary | ICD-10-CM | POA: Diagnosis not present

## 2021-09-12 DIAGNOSIS — F411 Generalized anxiety disorder: Secondary | ICD-10-CM | POA: Diagnosis not present

## 2021-09-12 DIAGNOSIS — G4700 Insomnia, unspecified: Secondary | ICD-10-CM | POA: Diagnosis not present

## 2021-09-14 ENCOUNTER — Other Ambulatory Visit: Payer: Self-pay | Admitting: Family Medicine

## 2021-09-14 ENCOUNTER — Other Ambulatory Visit: Payer: Self-pay | Admitting: Gastroenterology

## 2021-09-14 DIAGNOSIS — I10 Essential (primary) hypertension: Secondary | ICD-10-CM

## 2021-09-17 ENCOUNTER — Other Ambulatory Visit: Payer: Self-pay | Admitting: Obstetrics and Gynecology

## 2021-09-17 ENCOUNTER — Other Ambulatory Visit: Payer: Self-pay

## 2021-09-17 NOTE — Patient Outreach (Signed)
?Medicaid Managed Care   ?Nurse Care Manager Note ? ?09/17/2021 ?Name:  Sara Munoz MRN:  [id number] DOB:  05/25/1966 ? ?Sara Munoz is an 55 y.o. year old female who is a primary patient of Pollak, Adriana M, [name] (Inactive).  The Medicaid Managed Care Coordination team was consulted for assistance with:    ?Chronic healthcare management needs, tobacco use, HTN, chronic pain, migraines, COPD, OSA, anxiety, GERD ? ?Sara Munoz was given information about Medicaid Managed Care Coordination team services today. Sara Munoz Patient agreed to services and verbal consent obtained. ? ?Engaged with patient by telephone for follow up visit in response to provider referral for case management and/or care coordination services.  ? ?Assessments/Interventions:  Review of past medical history, allergies, medications, health status, including review of consultants reports, laboratory and other test data, was performed as part of comprehensive evaluation and provision of chronic care management services. ? ?SDOH (Social Determinants of Health) assessments and interventions performed: ?SDOH Interventions   ? ?Flowsheet Row Most Recent Value  ?SDOH Interventions   ?Financial Strain Interventions Other (Comment)  [no resources needed at this time]  ?Transportation Interventions Intervention Not Indicated  [utilizes insurance transportation as needed.]  ? ?  ?Care Plan ? ?Allergies  ?Allergen Reactions  ? Flexeril [Cyclobenzaprine] Hives and Swelling  ?  Facial/lip swelling  ?   ? Levofloxacin Hives and Swelling  ? Phenergan [Promethazine Hcl] Other (See Comments)  ?  Agitation.   ? Toradol [Ketorolac Tromethamine] Swelling and Other (See Comments)  ?  Facial/tongue swelling   ? Tramadol Hives, Swelling and Other (See Comments)  ?  Lip swelling   ? Zoloft [Sertraline Hcl] Swelling  ?  Tongue swelling  ?   ? ?Medications Reviewed Today   ? ? Reviewed by Craft, Terri G, RN (Registered Nurse) on 09/17/21 at 1424  Med List Status:  <None>  ? ?Medication Order Taking? Sig Documenting Provider Last Dose Status Informant  ?albuterol (PROVENTIL) (2.5 MG/3ML) 0.083% nebulizer solution [id number] No Take 3 mLs (2.5 mg total) by nebulization every 6 (six) hours as needed for wheezing or shortness of breath. Gonzalez, Carmen L, MD Taking Active Self  ?albuterol (VENTOLIN HFA) 108 (90 Base) MCG/ACT inhaler [id number] No INHALE TWO PUFFS BY MOUTH INTO LUNGS EVERY 6 HOURS AS NEEDED FOR SHORTNESS OF BREATH OR wheezing Gilbert, Richard L Jr., MD Taking Active   ?amLODipine (NORVASC) 5 MG tablet [id number] No TAKE ONE TABLET BY MOUTH ONCE DAILY Gilbert, Richard L Jr., MD Taking Active   ?atorvastatin (LIPITOR) 20 MG tablet [id number] No TAKE ONE TABLET BY MOUTH EVERY EVENING Gilbert, Richard L Jr., MD Taking Active   ?docusate sodium (COLACE) 100 MG capsule [id number] No Take 100 mg by mouth 2 (two) times daily. [provider] Taking Active Self  ?doxycycline (VIBRA-TABS) 100 MG tablet [id number] No Take 1 tablet by mouth in the morning and at bedtime. [provider] Taking Active   ?DULoxetine (CYMBALTA) 30 MG capsule [id number] No Take 30 mg by mouth 2 (two) times daily. [provider] Taking Active Self  ?Erenumab-aooe (AIMOVIG) 70 MG/ML SOAJ [id number] No Inject 70 mg into the skin every 30 (thirty) days. Yan, Yijun, MD Taking Active   ?gabapentin (NEURONTIN) 600 MG tablet [id number] No Take 600 mg by mouth 4 (four) times daily. [provider] Taking Active Self  ?hydrOXYzine (ATARAX) 50 MG tablet [id number] No Take 50 mg by mouth 3 (three) times daily. [provider] Taking Active   ?lactulose (CHRONULAC)   10 GM/15ML solution [id number]  Take 30 mLs (20 g total) by mouth 4 (four) times daily. [name], MD  Active   ?lisinopril (ZESTRIL) 40 MG tablet [id number] No TAKE ONE TABLET BY MOUTH EVERY EVENING Jerrol Banana., MD Taking Active   ?metoprolol tartrate (LOPRESSOR) 25 MG tablet [id number]  TAKE 1 TABLET  BY MOUTH TWICE A DAY Jerrol Banana., MD  Active   ?naloxone [facility]) 4 MG/0.1ML LIQD nasal spray kit [id number] No Place 1 spray into the nose as needed (opioid overdose). [provider] Taking Active Self  ?         ?Med Note Ivor Reining May 03, 2021 11:23 AM)    ?Naloxone HCl (NARCAN IJ) [id number] No Inject 1 Dose as directed as needed (opioid overdose). [provider] Taking Active Self  ?         ?Med Note Ivor Reining May 03, 2021 11:32 AM)    ?naproxen (NAPROSYN) 500 MG tablet [id number] No Take 500 mg by mouth 2 (two) times daily. [provider] Taking Active   ?omeprazole (PRILOSEC) 40 MG capsule [id number]  TAKE 1 CAPSULE BY MOUTH TWICE A DAY [name], MD  Active   ?ondansetron (ZOFRAN-ODT) 4 MG disintegrating tablet [id number] No Take 1 tablet (4 mg total) by mouth every 8 (eight) hours as needed for nausea or vomiting. [name], MD Taking Active   ?ondansetron (ZOFRAN-ODT) 8 MG disintegrating tablet [id number] No TAKE ONE TABLET BY MOUTH TWICE DAILY AS NEEDED [name], MD Taking Active   ?oxyCODONE HCl 15 MG TABA [id number] No Take 15 tablets by mouth 5 (five) times daily. [provider] Taking Active   ?Plecanatide (TRULANCE) 3 MG TABS [id number] No Take 1 tablet by mouth daily. [name], MD Taking Active Self  ?         ?Med Note Rich Reining, Elson Clan Jul 09, 2021  7:42 AM)    ?SPIRIVA RESPIMAT 2.5 MCG/ACT AERS [id number] No INHALE 1 PUFF BY MOUTH INTO LUNGS ONCE DAILY Bacigalupo, Dionne Bucy, MD Taking Active Self  ?sucralfate (CARAFATE) 1 GM/10ML suspension [id number]  TAKE 10 MLS (1 G TOTAL) BY MOUTH 4 (FOUR) TIMES DAILY. [name], MD  Active   ?SUMAtriptan (IMITREX) 25 MG tablet [id number] No May repeat in 2 hours if headache persists or recurs. [name], MD Taking Active   ?SYMBICORT 160-4.5 MCG/ACT inhaler [id number] No INHALE TWO PUFFS BY MOUTH INTO LUNGS IN THE MORNING and AT BEDTIME Jerrol Banana., MD Taking  Active   ?tiZANidine (ZANAFLEX) 4 MG tablet [id number] No Take 1 tablet (4 mg total) by mouth every 6 (six) hours as needed for muscle spasms. [name], MD Taking Active   ? ?  ?  ? ?  ? ?Patient Active Problem List  ? Diagnosis Date Noted  ? Chronic migraine w/o aura w/o status migrainosus, not intractable 07/09/2021  ? Obstructive sleep apnea 07/09/2021  ? Body mass index (BMI) 32.0-32.9, adult 06/07/2021  ? Pseudoarthrosis of cervical spine ([place]) 05/14/2021  ? Lumbar radiculopathy 05/23/2020  ? Sacroiliac inflammation ([place]) 11/25/2019  ? Sepsis ([place]) 10/19/2018  ? Severe sepsis ([place]) 10/19/2018  ? Cervical radiculopathy 02/12/2018  ? Chronic migraine 02/12/2018  ? Paresthesia 08/29/2017  ? Neck pain 08/29/2017  ? Daily headache 08/29/2017  ? Chronic active hepatitis ([place]) 07/29/2017  ? Neurogenic pain 07/31/2016  ? Chronic pain syndrome 06/05/2016  ? Carotid artery stenosis 02/27/2016  ?  Chronic constipation 02/27/2016  ? Chronic nausea 02/27/2016  ? Hypercholesterolemia 02/27/2016  ? Osteoarthritis 02/27/2016  ? PTSD (post-traumatic stress disorder) 02/27/2016  ? TIA (transient ischemic attack) 02/27/2016  ? Long term current use of opiate analgesic 02/27/2016  ? Long term prescription opiate use 02/27/2016  ? Encounter for pain management consult 02/27/2016  ? Chronic hip pain (Location of Tertiary source of pain) (Bilateral) (L>R) 02/27/2016  ? Chronic knee pain (Bilateral) (L>R) 02/27/2016  ? Chronic shoulder pain (Bilateral) (L>R) 02/27/2016  ? Chronic sacroiliac joint pain (Bilateral) (L>R) 02/27/2016  ? Chronic low back pain (Location of Primary Source of Pain) (Bilateral) (L>R) 02/27/2016  ? Chronic lower extremity pain (Location of Secondary source of pain) (Bilateral) (L>R) 02/27/2016  ? 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 Chronic neck pain (posterior midline) (Bilateral) (L>R) 02/27/2016  ? Cervicogenic headache (Bilateral) (L>R) 02/27/2016  ? Occipital headache (Bilateral) (L>R)  02/27/2016  ? Chronic upper extremity pain (Bilateral) (L>R) 02/27/2016  ? Chronic Cervical radicular pain (Bilateral) (L>R) 02/27/2016  ? Chronic lumbar radicular pain (Right) (L5 dermatome) 02/27/2016

## 2021-09-17 NOTE — Patient Instructions (Signed)
Hi Sara Munoz for speaking with me-I hope you feel better. ? ?Sara Munoz was given information about Medicaid Managed Care team care coordination services as a part of their [place] Complete Medicaid benefit. Sara Munoz verbally consented to engagement with the [facility] Managed Care team.  ? ?If you are experiencing a medical emergency, please call 911 or report to your local emergency department or urgent care.  ? ?If you have a non-emergency medical problem during routine business hours, please contact your provider's office and ask to speak with a nurse.  ? ?For questions related to your [place] Complete Medicaid health plan, please call: [phone] ? ?If you would like to schedule transportation through your [place] Complete Medicaid plan, please call the following number at least 2 days in advance of your appointment: [phone].  ? There is no limit to the number of trips during the year between medical appointments, healthcare facilities, or pharmacies. Transportation must be scheduled at least 2 business days before but not more than thirty 30 days before of your appointment. ? ?Call the [facility] at [phone], at any time, 24 hours a day, 7 days a week. If you are in danger or need immediate medical attention call 911. ? ?If you would like help to quit smoking, call 1-800-QUIT-NOW 440 499 8336) OR Espa?ol: 1-855-D?jelo-Ya 586-100-2486) o para m?s informaci?n haga clic aqu? or Text READY to 200-400 to register via text ? ?Sara Munoz - following are the goals we discussed in your visit today:  ? ?Timeframe:  Long-Range Goal ?Priority:  High ?Start Date:       12/20/20                      ?Expected End Date:    ongoing                  ? ?Follow Up Date: 09/18/21  ?- schedule appointment for flu shot ?- schedule appointment for vaccines needed due to my age or health ?- schedule recommended health tests (blood work, mammogram, colonoscopy, pap test) ?- schedule  and keep appointment for annual check-up  ? ?09/17/21:  Patient has follow up-appts scheduled ?  ?Why is this important?   ?Screening tests can find diseases early when they are easier to treat.  ?Your doctor or nurse will talk with you about which tests are important for you.  ?Getting shots for common diseases like the flu and shingles will help prevent them.   ?  ?Patient verbalizes understanding of instructions and care plan provided today and agrees to view in [place]. Active MyChart status confirmed with patient.   ? ?The Managed Medicaid care management team will reach out to the patient again over the next 30 days.  ?The  Patient has been provided with contact information for the Managed Medicaid care management team and has been advised to call with any health related questions or concerns.  ? ?Sara Raider RN, BSN ?[facility] ?Care Management Coordinator - Managed Medicaid High Risk ?[phone] ?  ?Following is a copy of your plan of care:  ?Care Plan : General Plan of Care (Adult)  ?Updates made by [name], RN since 09/17/2021 12:00 AM  ?  ? ?Problem: Health Promotion or Disease Self-Management (General Plan of Care)   ?Priority: High  ?Onset Date: 12/20/2020  ?  ? ?Long-Range Goal: Self-Management Plan Developed   ?Start Date: 12/20/2020  ?Expected End Date: 10/24/2021  ?Recent Progress: Not  on track  ?Priority: High  ?Note:   ? ?Current Barriers:  ?Knowledge Deficits related to short term plan for care coordination needs and long term plans for chronic disease management needs.  Patient with many chronic health conditions. ?09/17/21:  Patient states she still has neck pain, no change.  Has follow up appt scheduled with providers.  No change in smoking 1/2 ppd ? ?Nurse Case Manager Clinical Goal(s):  ?patient will work with care management team to address care coordination and chronic disease management needs related to Disease Management ?Educational Needs ?Care  Coordination ?Medication Management and Education ?Medication Reconciliation ?Medication Assistance  ?Psychosocial Support ?Mental Health Counseling   ?Interventions:  ?Evaluation of current treatment plan  and patient's adherence to plan as established by provider. ?Reviewed medications with patient. ?Collaborated with pharmacy regarding medications.. ? Collaborated with SW regarding anxiety. ?Discussed plans with patient for ongoing care management follow up and provided patient with direct contact information for care management team ?Advised patient, providing education and rationale, to monitor blood pressure daily and record, calling PCP for findings outside established parameters.  ?Reviewed scheduled/upcoming provider appointments. ?Social Work referral for anxiety, PTSD-completed ?Pharmacy referral for medication review-completed. ?Patient given 1-800-QUITNOW for smoking cessation if interested. ? ?Self Care Activities:  ?Patient will self administer medications as prescribed ?Patient will attend all scheduled provider appointments ?Patient will call pharmacy for medication refills ?Patient will continue to perform ADL's independently ?Patient will call provider office for new concerns or questions ?Patient Goals: ?In the next 90 days, patient will attend all scheduled appointments. ?- ?Follow Up Plan: The patient has been provided with contact information for the care management team and has been advised to call with any health related questions or concerns.  ?The care management team will reach out to the patient again over the next 30 days.  Implement additional goals and interventions based on identified risk factors.  ?  ?  ?

## 2021-09-18 ENCOUNTER — Institutional Professional Consult (permissible substitution): Payer: Medicaid Other | Admitting: Neurology

## 2021-09-18 ENCOUNTER — Encounter: Payer: Self-pay | Admitting: Neurology

## 2021-09-25 DIAGNOSIS — M79661 Pain in right lower leg: Secondary | ICD-10-CM | POA: Diagnosis not present

## 2021-09-25 DIAGNOSIS — M79621 Pain in right upper arm: Secondary | ICD-10-CM | POA: Diagnosis not present

## 2021-09-25 DIAGNOSIS — M79622 Pain in left upper arm: Secondary | ICD-10-CM | POA: Diagnosis not present

## 2021-09-25 DIAGNOSIS — M5136 Other intervertebral disc degeneration, lumbar region: Secondary | ICD-10-CM | POA: Diagnosis not present

## 2021-09-25 DIAGNOSIS — M79662 Pain in left lower leg: Secondary | ICD-10-CM | POA: Diagnosis not present

## 2021-09-25 DIAGNOSIS — M25512 Pain in left shoulder: Secondary | ICD-10-CM | POA: Diagnosis not present

## 2021-09-25 DIAGNOSIS — G894 Chronic pain syndrome: Secondary | ICD-10-CM | POA: Diagnosis not present

## 2021-09-25 DIAGNOSIS — M25511 Pain in right shoulder: Secondary | ICD-10-CM | POA: Diagnosis not present

## 2021-09-25 DIAGNOSIS — M545 Low back pain, unspecified: Secondary | ICD-10-CM | POA: Diagnosis not present

## 2021-09-25 DIAGNOSIS — M542 Cervicalgia: Secondary | ICD-10-CM | POA: Diagnosis not present

## 2021-09-25 DIAGNOSIS — Z79891 Long term (current) use of opiate analgesic: Secondary | ICD-10-CM | POA: Diagnosis not present

## 2021-09-26 ENCOUNTER — Telehealth: Payer: Self-pay | Admitting: *Deleted

## 2021-09-26 NOTE — Telephone Encounter (Signed)
Patient has pharmacy coverage with [facility] Complete Health Managed Medicaid [id number] R).  ?  ?PA for Aimovig '70mg'$  started on covermymeds (key: [id number]). Decision pending. ?

## 2021-09-26 NOTE — Telephone Encounter (Signed)
PA approved through 09/26/2022. ?

## 2021-10-01 ENCOUNTER — Other Ambulatory Visit: Payer: Self-pay | Admitting: Neurology

## 2021-10-08 DIAGNOSIS — G4700 Insomnia, unspecified: Secondary | ICD-10-CM | POA: Diagnosis not present

## 2021-10-08 DIAGNOSIS — F411 Generalized anxiety disorder: Secondary | ICD-10-CM | POA: Diagnosis not present

## 2021-10-09 ENCOUNTER — Other Ambulatory Visit: Payer: Self-pay

## 2021-10-09 ENCOUNTER — Ambulatory Visit: Payer: Medicaid Other | Admitting: Gastroenterology

## 2021-10-23 ENCOUNTER — Other Ambulatory Visit: Payer: Self-pay | Admitting: Obstetrics and Gynecology

## 2021-10-23 NOTE — Patient Outreach (Signed)
Medicaid Managed Care   Nurse Care Manager Note  10/23/2021 Name:  Sara Munoz MRN:  [id number] DOB:  April 25, 1966  Sara Munoz is an 56 y.o. year old female who is a primary patient of Sara Sailors, [name] (Inactive).  The [facility] Managed Care Coordination team was consulted for assistance with:    Chronic healthcare management needs, tobacco use, HTN, chronic pain, migraines, COPD, OSA, anxiety/PTSD, GERD, CAD  [name] was given information about Medicaid Managed Care Coordination team services today. [name] Patient agreed to services and verbal consent obtained.  Engaged with patient by telephone for follow up visit in response to provider referral for case management and/or care coordination services.   Assessments/Interventions:  Review of past medical history, allergies, medications, health status, including review of consultants reports, laboratory and other test data, was performed as part of comprehensive evaluation and provision of chronic care management services.  SDOH (Social Determinants of Health) assessments and interventions performed: SDOH Interventions    Flowsheet Row Most Recent Value  SDOH Interventions   Stress Interventions Other (Comment)  [sees Psych and counselor]     Care Plan  Allergies  Allergen Reactions   Flexeril [Cyclobenzaprine] Hives and Swelling    Facial/lip swelling      Levofloxacin Hives and Swelling   Phenergan [Promethazine Hcl] Other (See Comments)    Agitation.    Toradol [Ketorolac Tromethamine] Swelling and Other (See Comments)    Facial/tongue swelling    Tramadol Hives, Swelling and Other (See Comments)    Lip swelling    Zoloft [Sertraline Hcl] Swelling    Tongue swelling      Medications Reviewed Today     Reviewed by [name], RN (Registered Nurse) on 10/23/21 at 1433  Med List Status: <None>   Medication Order Taking? Sig Documenting Provider Last Dose Status Informant  albuterol (PROVENTIL) (2.5  MG/3ML) 0.083% nebulizer solution [id number] No Take 3 mLs (2.5 mg total) by nebulization every 6 (six) hours as needed for wheezing or shortness of breath. Salena Saner, MD Taking Active Self  albuterol (VENTOLIN HFA) 108 (90 Base) MCG/ACT inhaler [id number] No INHALE TWO PUFFS BY MOUTH INTO LUNGS EVERY 6 HOURS AS NEEDED FOR SHORTNESS OF BREATH OR wheezing Maple Hudson., MD Taking Active   amLODipine [facility]) 5 MG tablet [id number] No TAKE ONE TABLET BY MOUTH ONCE DAILY Maple Hudson., MD Taking Active   atorvastatin (LIPITOR) 20 MG tablet [id number] No TAKE ONE TABLET BY MOUTH EVERY EVENING Maple Hudson., MD Taking Active   docusate sodium (COLACE) 100 MG capsule [id number] No Take 100 mg by mouth 2 (two) times daily. [provider] Taking Active Self  doxycycline (VIBRA-TABS) 100 MG tablet [id number] No Take 1 tablet by mouth in the morning and at bedtime. [provider] Taking Active   DULoxetine (CYMBALTA) 30 MG capsule [id number] No Take 30 mg by mouth 2 (two) times daily. [provider] Taking Active Self  Erenumab-aooe (AIMOVIG) 70 MG/ML SOAJ [id number] No Inject 70 mg into the skin every 30 (thirty) days. Levert Feinstein, MD Taking Active   gabapentin (NEURONTIN) 600 MG tablet [id number] No Take 600 mg by mouth 4 (four) times daily. [provider] Taking Active Self  hydrOXYzine (ATARAX) 50 MG tablet [id number] No Take 50 mg by mouth 3 (three) times daily. [provider] Taking Active   lactulose (CHRONULAC) 10 GM/15ML solution [id number]  Take 30 mLs (20 g total) by mouth 4 (  four) times daily. [name], MD  Active   LINZESS 290 MCG CAPS capsule [id number]  Take 290 mcg by mouth every morning. [provider]  Active   lisinopril (ZESTRIL) 40 MG tablet [id number] No TAKE ONE TABLET BY MOUTH EVERY EVENING Maple Hudson., MD Taking Active   metoprolol tartrate (LOPRESSOR) 25 MG tablet [id number]  TAKE 1 TABLET BY  MOUTH TWICE A DAY Maple Hudson., MD  Active   mirtazapine (REMERON) 7.5 MG tablet [id number]  Take 7.5 mg by mouth at bedtime. [provider]  Active   naloxone [facility]) 4 MG/0.1ML LIQD nasal spray kit [id number] No Place 1 spray into the nose as needed (opioid overdose). [provider] Taking Active Self           Med Note Richardean Canal May 03, 2021 11:23 AM)    Naloxone HCl [facility]) [id number] No Inject 1 Dose as directed as needed (opioid overdose). [provider] Taking Active Self           Med Note Richardean Canal May 03, 2021 11:32 AM)    naproxen (NAPROSYN) 500 MG tablet [id number] No Take 500 mg by mouth 2 (two) times daily. [provider] Taking Active   omeprazole (PRILOSEC) 40 MG capsule [id number]  TAKE 1 CAPSULE BY MOUTH TWICE A DAY [name], MD  Active   ondansetron (ZOFRAN-ODT) 4 MG disintegrating tablet [id number]  Take 1 tablet (4 mg total) by mouth every 8 (eight) hours as needed for nausea or vomiting. #20 allowed per 30 days. Levert Feinstein, MD  Active   ondansetron (ZOFRAN-ODT) 8 MG disintegrating tablet [id number] No TAKE ONE TABLET BY MOUTH TWICE DAILY AS NEEDED [name], MD Taking Active   oxyCODONE (ROXICODONE) 15 MG immediate release tablet [id number]  Take 15 mg by mouth. [provider]  Active   SPIRIVA RESPIMAT 2.5 MCG/ACT AERS [id number] No INHALE 1 PUFF BY MOUTH INTO LUNGS ONCE DAILY Bacigalupo, Marzella Schlein, MD Taking Active Self  sucralfate (CARAFATE) 1 GM/10ML suspension [id number]  TAKE 10 MLS (1 G TOTAL) BY MOUTH 4 (FOUR) TIMES DAILY. [name], MD  Active   SUMAtriptan [facility]) 25 MG tablet [id number] No May repeat in 2 hours if headache persists or recurs. Levert Feinstein, MD Taking Active   SYMBICORT 160-4.5 MCG/ACT inhaler [id number] No INHALE TWO PUFFS BY MOUTH INTO LUNGS IN THE MORNING and AT BEDTIME Maple Hudson., MD Taking Active   tiZANidine (ZANAFLEX) 4 MG tablet [id number] No Take  1 tablet (4 mg total) by mouth every 6 (six) hours as needed for muscle spasms. Levert Feinstein, MD Taking Active            Patient Active Problem List   Diagnosis Date Noted   Chronic migraine w/o aura w/o status migrainosus, not intractable 07/09/2021   Obstructive sleep apnea 07/09/2021   Body mass index (BMI) 32.0-32.9, adult 06/07/2021   Pseudoarthrosis of cervical spine (HCC) 05/14/2021   Lumbar radiculopathy 05/23/2020   Sacroiliac inflammation (HCC) 11/25/2019   Sepsis (HCC) 10/19/2018   Severe sepsis (HCC) 10/19/2018   Cervical radiculopathy 02/12/2018   Chronic migraine 02/12/2018   Paresthesia 08/29/2017   Neck pain 08/29/2017   Daily headache 08/29/2017   Chronic active hepatitis (HCC) 07/29/2017   Neurogenic pain 07/31/2016   Chronic pain syndrome 06/05/2016   Carotid artery stenosis 02/27/2016   Chronic constipation 02/27/2016   Chronic nausea 02/27/2016   Hypercholesterolemia 02/27/2016  Osteoarthritis 02/27/2016   PTSD (post-traumatic stress disorder) 02/27/2016   TIA (transient ischemic attack) 02/27/2016   Long term current use of opiate analgesic 02/27/2016   Long term prescription opiate use 02/27/2016   Encounter for pain management consult 02/27/2016   Chronic hip pain (Location of Tertiary source of pain) (Bilateral) (L>R) 02/27/2016   Chronic knee pain (Bilateral) (L>R) 02/27/2016   Chronic shoulder pain (Bilateral) (L>R) 02/27/2016   Chronic sacroiliac joint pain (Bilateral) (L>R) 02/27/2016   Chronic low back pain (Location of Primary Source of Pain) (Bilateral) (L>R) 02/27/2016   Chronic lower extremity pain (Location of Secondary source of pain) (Bilateral) (L>R) 02/27/2016   Osteoarthritis of hip (Bilateral) (L>R) 02/27/2016   Chronic neck pain (posterior midline) (Bilateral) (L>R) 02/27/2016   Cervicogenic headache (Bilateral) (L>R) 02/27/2016   Occipital headache (Bilateral) (L>R) 02/27/2016   Chronic upper extremity pain (Bilateral) (L>R)  02/27/2016   Chronic Cervical radicular pain (Bilateral) (L>R) 02/27/2016   Chronic lumbar radicular pain (Right) (L5 dermatome) 02/27/2016   Lumbar facet syndrome (Bilateral) (L>R) 02/27/2016   Long term prescription benzodiazepine use 02/27/2016   Hypertension    Chronic obstructive pulmonary disease (HCC) 10/09/2015   GERD (gastroesophageal reflux disease) 10/09/2015   Non-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/2014 08/02/2015   Hyperlipidemia 08/02/2015   Costochondritis 08/02/2015   Drug abuse, 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   GAD (generalized anxiety disorder) 09/03/2014   Narcotic dependence (HCC) 09/03/2014   PVD (peripheral vascular disease) (HCC) 09/03/2014   Smoker 09/03/2014   Cigarette nicotine dependence with nicotine-induced disorder 07/08/2014   Anemia of chronic disease 03/19/2014   Narcotic abuse, continuous (HCC) 03/19/2014   Polysubstance abuse (HCC) 03/19/2014   MSSA (methicillin susceptible Staphylococcus aureus) septicemia (HCC) 03/07/2014   Hypomagnesemia 01/04/2014   Chronic cough 09/04/2011   Sleep apnea, obstructive 09/04/2011   Sinusitis, acute 09/04/2011   Conditions to be addressed/monitored per PCP order:  Chronic healthcare management needs, tobacco use, HTN, chronic pain, migraines, COPD, OSA, anxiety/PTSD, GERD, CAD  Care Plan : General Plan of Care (Adult)  Updates made by [name], RN since 10/23/2021 12:00 AM     Problem: Health Promotion or Disease Self-Management (General Plan of Care)   Priority: High  Onset Date: 12/20/2020     Long-Range Goal: Self-Management Plan Developed   Start Date: 12/20/2020  Expected End Date: 01/23/2022  Recent Progress: Not on track  Priority: High  Note:    Current Barriers:  Knowledge Deficits related to short term plan for care coordination needs and long term plans for chronic disease management needs.  Patient with many chronic health conditions. 10/23/21: Patient complaining of stomach pain today and is going to  schedule  an appt with her GI provider.  Has appt with pain provider on Friday and surgeon for neck pain on 12/12/21.  Patient plans on stopping smoking-will get provider to prescribe Nicotine patch.  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  Nurse Case Manager Clinical Goal(s):  patient will work with care management team to address care coordination and chronic disease management needs related to Disease Management Educational Needs Care Coordination Medication Management and Education Medication Reconciliation Medication Assistance  Psychosocial Support Mental Health Counseling   Interventions:  Evaluation of current treatment plan  and patient's adherence to plan as established by provider. Reviewed medications with patient. Collaborated with pharmacy regarding medications.Marland Kitchen  Collaborated with SW regarding anxiety. Discussed plans with patient for ongoing care management follow up and provided patient with direct contact information for care management  team Advised patient, providing education and rationale, to monitor blood pressure daily and record, calling PCP for findings outside established parameters.  Reviewed scheduled/upcoming provider appointments. Social Work referral for anxiety, PTSD-completed Pharmacy referral for medication review-completed. Patient given 1-800-QUITNOW for smoking cessation if interested.  Self Care Activities:  Patient will self administer medications as prescribed Patient will attend all scheduled provider appointments Patient will call pharmacy for medication refills Patient will continue to perform ADL's independently Patient will call provider office for new concerns or questions Patient Goals: In the next 90 days, patient will attend all scheduled appointments. - Follow Up Plan: The patient has been provided with contact information for the care management team and has been advised to call with any health related questions or concerns.   The care management team will reach out to the patient again over the next 30 days.  Implement additional goals and interventions based on identified risk factors.    Follow Up:  Patient agrees to Care Plan and Follow-up.  Plan: The Managed Medicaid care management team will reach out to the patient again over the next 30 days. and The  Patient has been provided with contact information for the Managed Medicaid care management team and has been advised to call with any health related questions or concerns.  Date/time of next scheduled RN care management/care coordination outreach:  11/23/21 at 230

## 2021-10-23 NOTE — Patient Instructions (Signed)
Hi Ms. Mccolgan to speak with you today-I hope you have a good afternoon and feel better. ? ?Ms. Bordenave was given information about Medicaid Managed Care team care coordination services as a part of their [place] Complete Medicaid benefit. [name] verbally consented to engagement with the [facility] Managed Care team.  ? ?If you are experiencing a medical emergency, please call 911 or report to your local emergency department or urgent care.  ? ?If you have a non-emergency medical problem during routine business hours, please contact your provider's office and ask to speak with a nurse.  ? ?For questions related to your [place] Complete Medicaid health plan, please call: [phone] ? ?If you would like to schedule transportation through your [place] Complete Medicaid plan, please call the following number at least 2 days in advance of your appointment: [phone].  ? There is no limit to the number of trips during the year between medical appointments, healthcare facilities, or pharmacies. Transportation must be scheduled at least 2 business days before but not more than thirty 30 days before of your appointment. ? ?Call the [facility] at [phone], at any time, 24 hours a day, 7 days a week. If you are in danger or need immediate medical attention call 911. ? ?If you would like help to quit smoking, call 1-800-QUIT-NOW 856 402 1948) OR Espa?ol: 1-855-D?jelo-Ya 208-219-8907) o para m?s informaci?n haga clic aqu? or Text READY to 200-400 to register via text ? ?Ms. Borror - following are the goals we discussed in your visit today:  ?Timeframe:  Long-Range Goal ?Priority:  High ?Start Date:       12/20/20                      ?Expected End Date:    ongoing                  ? ?Follow Up Date:11/23/21 ?- schedule appointment for flu shot ?- schedule appointment for vaccines needed due to my age or health ?- schedule recommended health tests (blood work, mammogram, colonoscopy,  pap test) ?- schedule and keep appointment for annual check-up  ? ?10/23/21:  patient has an appt scheduled with pain provider on Friday, surgeon 6/21. ?  ?Why is this important?   ?Screening tests can find diseases early when they are easier to treat.  ?Your doctor or nurse will talk with you about which tests are important for you.  ?Getting shots for common diseases like the flu and shingles will help prevent them.   ?  ?Patient verbalizes understanding of instructions and care plan provided today and agrees to view in [place]. Active MyChart status confirmed with patient.   ? ?The Managed Medicaid care management team will reach out to the patient again over the next 30 days.  ?The  Patient  has been provided with contact information for the Managed Medicaid care management team and has been advised to call with any health related questions or concerns.  ? ?[name] RN, BSN ?[facility] ?Care Management Coordinator - Managed Medicaid High Risk ?[phone] ?  ?Following is a copy of your plan of care:  ?Care Plan : General Plan of Care (Adult)  ?Updates made by [name], RN since 10/23/2021 12:00 AM  ?  ? ?Problem: Health Promotion or Disease Self-Management (General Plan of Care)   ?Priority: High  ?Onset Date: 12/20/2020  ?  ? ?Long-Range Goal: Self-Management Plan Developed   ?Start  Date: 12/20/2020  ?Expected End Date: 01/23/2022  ?Recent Progress: Not on track  ?Priority: High  ?Note:   ? ?Current Barriers:  ?Knowledge Deficits related to short term plan for care coordination needs and long term plans for chronic disease management needs.  Patient with many chronic health conditions. ?10/23/21: Patient complaining of stomach pain today and is going to schedule  an appt with her GI provider.  Has appt with pain provider on Friday and surgeon for neck pain on 12/12/21.  Patient plans on stopping smoking-will get provider to prescribe Nicotine patch.  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 ? ?Nurse Case Manager Clinical Goal(s):  ?patient will work with care management team to address care coordination and chronic disease management needs related to Disease Management ?Educational Needs ?Care Coordination ?Medication Management and Education ?Medication Reconciliation ?Medication Assistance  ?Psychosocial Support ?Mental Health Counseling   ?Interventions:  ?Evaluation of current treatment plan  and patient's adherence to plan as established by provider. ?Reviewed medications with patient. ?Collaborated with pharmacy regarding medications.. ? Collaborated with SW regarding anxiety. ?Discussed plans with patient for ongoing care management follow up and provided patient with direct contact information for care management team ?Advised patient, providing education and rationale, to monitor blood pressure daily and record, calling PCP for findings outside established parameters.  ?Reviewed scheduled/upcoming provider appointments. ?Social Work referral for anxiety, PTSD-completed ?Pharmacy referral for medication review-completed. ?Patient given 1-800-QUITNOW for smoking cessation if interested. ? ?Self Care Activities:  ?Patient will self administer medications as prescribed ?Patient will attend all scheduled provider appointments ?Patient will call pharmacy for medication refills ?Patient will continue to perform ADL's independently ?Patient will call provider office for new concerns or questions ?Patient Goals: ?In the next 90 days, patient will attend all scheduled appointments. ?- ?Follow Up Plan: The patient has been provided with contact information for the care management team and has been advised to call with any health related questions or concerns.  ?The care management team will reach out to the patient again over the next 30 days.  Implement additional goals and interventions based on identified risk factors.   ?  ?

## 2021-10-26 DIAGNOSIS — M25512 Pain in left shoulder: Secondary | ICD-10-CM | POA: Diagnosis not present

## 2021-10-26 DIAGNOSIS — Z79891 Long term (current) use of opiate analgesic: Secondary | ICD-10-CM | POA: Diagnosis not present

## 2021-10-26 DIAGNOSIS — M25511 Pain in right shoulder: Secondary | ICD-10-CM | POA: Diagnosis not present

## 2021-10-26 DIAGNOSIS — M79662 Pain in left lower leg: Secondary | ICD-10-CM | POA: Diagnosis not present

## 2021-10-26 DIAGNOSIS — M542 Cervicalgia: Secondary | ICD-10-CM | POA: Diagnosis not present

## 2021-10-26 DIAGNOSIS — M79661 Pain in right lower leg: Secondary | ICD-10-CM | POA: Diagnosis not present

## 2021-10-26 DIAGNOSIS — M545 Low back pain, unspecified: Secondary | ICD-10-CM | POA: Diagnosis not present

## 2021-10-26 DIAGNOSIS — G894 Chronic pain syndrome: Secondary | ICD-10-CM | POA: Diagnosis not present

## 2021-10-26 DIAGNOSIS — M79621 Pain in right upper arm: Secondary | ICD-10-CM | POA: Diagnosis not present

## 2021-10-30 ENCOUNTER — Other Ambulatory Visit: Payer: Self-pay | Admitting: Gastroenterology

## 2021-11-22 DIAGNOSIS — M25511 Pain in right shoulder: Secondary | ICD-10-CM | POA: Diagnosis not present

## 2021-11-22 DIAGNOSIS — M545 Low back pain, unspecified: Secondary | ICD-10-CM | POA: Diagnosis not present

## 2021-11-22 DIAGNOSIS — G894 Chronic pain syndrome: Secondary | ICD-10-CM | POA: Diagnosis not present

## 2021-11-22 DIAGNOSIS — M79661 Pain in right lower leg: Secondary | ICD-10-CM | POA: Diagnosis not present

## 2021-11-22 DIAGNOSIS — G890 Central pain syndrome: Secondary | ICD-10-CM | POA: Diagnosis not present

## 2021-11-22 DIAGNOSIS — M79621 Pain in right upper arm: Secondary | ICD-10-CM | POA: Diagnosis not present

## 2021-11-22 DIAGNOSIS — M25512 Pain in left shoulder: Secondary | ICD-10-CM | POA: Diagnosis not present

## 2021-11-22 DIAGNOSIS — M5136 Other intervertebral disc degeneration, lumbar region: Secondary | ICD-10-CM | POA: Diagnosis not present

## 2021-11-22 DIAGNOSIS — Z79891 Long term (current) use of opiate analgesic: Secondary | ICD-10-CM | POA: Diagnosis not present

## 2021-11-22 DIAGNOSIS — M542 Cervicalgia: Secondary | ICD-10-CM | POA: Diagnosis not present

## 2021-11-22 DIAGNOSIS — M79622 Pain in left upper arm: Secondary | ICD-10-CM | POA: Diagnosis not present

## 2021-11-22 DIAGNOSIS — M79662 Pain in left lower leg: Secondary | ICD-10-CM | POA: Diagnosis not present

## 2021-11-23 ENCOUNTER — Other Ambulatory Visit: Payer: Self-pay | Admitting: Obstetrics and Gynecology

## 2021-11-23 NOTE — Patient Outreach (Signed)
Medicaid Managed Care   Nurse Care Manager Note  11/23/2021 Name:  Sara Munoz MRN:  [id number] DOB:  [birth year]  Sara Munoz is an 56 y.o. year old female who is a primary patient of Sara Post, [name] (Inactive).  [facility] Managed Care Coordination team was consulted for assistance with:    Chronic healthcare management needs, CAD, HTN, PVD, TIA, migraines, COPD, tobacco use, GERD, chronic pain, osteoarthritis, OSA, constipation  Sara Munoz was given information about Medicaid Managed Care Coordination team services today. Sara Munoz Patient agreed to services and verbal consent obtained.  Engaged with patient by telephone for follow up visit in response to provider referral for case management and/or care coordination services.   Assessments/Interventions:  Review of past medical history, allergies, medications, health status, including review of consultants reports, laboratory and other test data, was performed as part of comprehensive evaluation and provision of chronic care management services.  SDOH (Social Determinants of Health) assessments and interventions performed: SDOH Interventions    Flowsheet Row Most Recent Value  SDOH Interventions   Housing Interventions Intervention Not Indicated      Care Plan  Allergies  Allergen Reactions   Flexeril [Cyclobenzaprine] Hives and Swelling    Facial/lip swelling      Levofloxacin Hives and Swelling   Phenergan [Promethazine Hcl] Other (See Comments)    Agitation.    Toradol [Ketorolac Tromethamine] Swelling and Other (See Comments)    Facial/tongue swelling    Tramadol Hives, Swelling and Other (See Comments)    Lip swelling    Zoloft [Sertraline Hcl] Swelling    Tongue swelling      Medications Reviewed Today     Reviewed by Sara Medicus, RN (Registered Nurse) on 11/23/21 at 52  Med List Status: <None>   Medication Order Taking? Sig Documenting Provider Last Dose Status Informant  albuterol  (PROVENTIL) (2.5 MG/3ML) 0.083% nebulizer solution [id number] No Take 3 mLs (2.5 mg total) by nebulization every 6 (six) hours as needed for wheezing or shortness of breath. [name], MD Taking Active Self  albuterol (VENTOLIN HFA) 108 (90 Base) MCG/ACT inhaler [id number] No INHALE TWO PUFFS BY MOUTH INTO LUNGS EVERY 6 HOURS AS NEEDED FOR SHORTNESS OF BREATH OR wheezing Jerrol Banana., MD Taking Active   amLODipine [facility]) 5 MG tablet [id number] No TAKE ONE TABLET BY MOUTH ONCE DAILY Jerrol Banana., MD Taking Active   atorvastatin (LIPITOR) 20 MG tablet [id number] No TAKE ONE TABLET BY MOUTH EVERY EVENING Jerrol Banana., MD Taking Active   docusate sodium (COLACE) 100 MG capsule [id number] No Take 100 mg by mouth 2 (two) times daily. [provider] Taking Active Self  doxycycline (VIBRA-TABS) 100 MG tablet [id number] No Take 1 tablet by mouth in the morning and at bedtime. [provider] Taking Active   DULoxetine (CYMBALTA) 30 MG capsule [id number] No Take 30 mg by mouth 2 (two) times daily. [provider] Taking Active Self  Erenumab-aooe (AIMOVIG) 70 MG/ML SOAJ [id number] No Inject 70 mg into the skin every 30 (thirty) days. [name], MD Taking Active   gabapentin (NEURONTIN) 600 MG tablet [id number] No Take 600 mg by mouth 4 (four) times daily. [provider] Taking Active Self  hydrOXYzine (ATARAX) 50 MG tablet [id number] No Take 50 mg by mouth 3 (three) times daily. [provider] Taking Active   lactulose (CHRONULAC) 10 GM/15ML solution [id number]  Take 30 mLs (20 g total) by mouth 4 (  four) times daily. [name], MD  Active   LINZESS 290 MCG CAPS capsule [id number]  Take 290 mcg by mouth every morning. [provider]  Active   lisinopril (ZESTRIL) 40 MG tablet [id number] No TAKE ONE TABLET BY MOUTH EVERY EVENING Jerrol Banana., MD Taking Active   metoprolol tartrate (LOPRESSOR) 25 MG tablet [id number]   TAKE 1 TABLET BY MOUTH TWICE A DAY Jerrol Banana., MD  Active   mirtazapine (REMERON) 7.5 MG tablet [id number]  Take 7.5 mg by mouth at bedtime. [provider]  Active   naloxone [facility]) 4 MG/0.1ML LIQD nasal spray kit [id number] No Place 1 spray into the nose as needed (opioid overdose). [provider] Taking Active Self           Med Note Ivor Reining May 03, 2021 11:23 AM)    Naloxone HCl [facility]) [id number] No Inject 1 Dose as directed as needed (opioid overdose). [provider] Taking Active Self           Med Note Ivor Reining May 03, 2021 11:32 AM)    naproxen (NAPROSYN) 500 MG tablet [id number] No Take 500 mg by mouth 2 (two) times daily. [provider] Taking Active   omeprazole (PRILOSEC) 40 MG capsule [id number]  TAKE 1 CAPSULE BY MOUTH TWICE A DAY [name], MD  Active   ondansetron (ZOFRAN-ODT) 4 MG disintegrating tablet [id number]  Take 1 tablet (4 mg total) by mouth every 8 (eight) hours as needed for nausea or vomiting. #20 allowed per 30 days. [name], MD  Active   ondansetron (ZOFRAN-ODT) 8 MG disintegrating tablet [id number] No TAKE ONE TABLET BY MOUTH TWICE DAILY AS NEEDED [name], MD Taking Active   oxyCODONE (ROXICODONE) 15 MG immediate release tablet [id number]  Take 15 mg by mouth. [provider]  Active   SPIRIVA RESPIMAT 2.5 MCG/ACT AERS [id number] No INHALE 1 PUFF BY MOUTH INTO LUNGS ONCE DAILY Bacigalupo, Dionne Bucy, MD Taking Active Self  sucralfate (CARAFATE) 1 GM/10ML suspension [id number]  TAKE 10 MLS (1 G TOTAL) BY MOUTH 4 (FOUR) TIMES DAILY. [name], MD  Active   SUMAtriptan [facility]) 25 MG tablet [id number] No May repeat in 2 hours if headache persists or recurs. [name], MD Taking Active   SYMBICORT 160-4.5 MCG/ACT inhaler [id number] No INHALE TWO PUFFS BY MOUTH INTO LUNGS IN THE MORNING and AT BEDTIME Jerrol Banana., MD Taking Active   tiZANidine (ZANAFLEX) 4 MG tablet  [id number] No Take 1 tablet (4 mg total) by mouth every 6 (six) hours as needed for muscle spasms. [name], MD Taking Active            Patient Active Problem List   Diagnosis Date Noted   Chronic migraine w/o aura w/o status migrainosus, not intractable 07/09/2021   Obstructive sleep apnea 07/09/2021   Body mass index (BMI) 32.0-32.9, adult 06/07/2021   Pseudoarthrosis of cervical spine ([place]) 05/14/2021   Lumbar radiculopathy 05/23/2020   Sacroiliac inflammation ([place]) 11/25/2019   Sepsis ([place]) 10/19/2018   Severe sepsis ([place]) 10/19/2018   Cervical radiculopathy 02/12/2018   Chronic migraine 02/12/2018   Paresthesia 08/29/2017   Neck pain 08/29/2017   Daily headache 08/29/2017   Chronic active hepatitis ([place]) 07/29/2017   Neurogenic pain 07/31/2016   Chronic pain syndrome 06/05/2016   Carotid artery stenosis 02/27/2016   Chronic constipation 02/27/2016   Chronic nausea 02/27/2016   Hypercholesterolemia 02/27/2016  Osteoarthritis 02/27/2016   PTSD (Munoz-traumatic stress disorder) 02/27/2016   TIA (transient ischemic attack) 02/27/2016   Long term current use of opiate analgesic 02/27/2016   Long term prescription opiate use 02/27/2016   Encounter for pain management consult 02/27/2016   Chronic hip pain (Location of Tertiary source of pain) (Bilateral) (L>R) 02/27/2016   Chronic knee pain (Bilateral) (L>R) 02/27/2016   Chronic shoulder pain (Bilateral) (L>R) 02/27/2016   Chronic sacroiliac joint pain (Bilateral) (L>R) 02/27/2016   Chronic low back pain (Location of Primary Source of Pain) (Bilateral) (L>R) 02/27/2016   Chronic lower extremity pain (Location of Secondary source of pain) (Bilateral) (L>R) 02/27/2016   Osteoarthritis of hip (Bilateral) (L>R) 02/27/2016   Chronic neck pain (posterior midline) (Bilateral) (L>R) 02/27/2016   Cervicogenic headache (Bilateral) (L>R) 02/27/2016   Occipital headache (Bilateral) (L>R) 02/27/2016   Chronic upper extremity pain  (Bilateral) (L>R) 02/27/2016   Chronic Cervical radicular pain (Bilateral) (L>R) 02/27/2016   Chronic lumbar radicular pain (Right) (L5 dermatome) 02/27/2016   Lumbar facet syndrome (Bilateral) (L>R) 02/27/2016   Long term prescription benzodiazepine use 02/27/2016   Hypertension    Chronic obstructive pulmonary disease ([place]) 10/09/2015   GERD (gastroesophageal reflux disease) 10/09/2015   Non-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/2014 08/02/2015   Hyperlipidemia 08/02/2015   Costochondritis 08/02/2015   Drug abuse, 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[place]) 09/03/2014   GAD (generalized anxiety disorder) 09/03/2014   Narcotic dependence ([place]) 09/03/2014   PVD (peripheral vascular disease) ([place]) 09/03/2014   Smoker 09/03/2014   Cigarette nicotine dependence with nicotine-induced disorder 07/08/2014   Anemia of chronic disease 03/19/2014   Narcotic abuse, continuous ([place]) 03/19/2014   Polysubstance abuse ([place]) 03/19/2014   MSSA (methicillin susceptible Staphylococcus aureus) septicemia ([place]) 03/07/2014   Hypomagnesemia 01/04/2014   Chronic cough 09/04/2011   Sleep apnea, obstructive 09/04/2011   Sinusitis, acute 09/04/2011   Conditions to be addressed/monitored per PCP order:  Chronic healthcare management needs, CAD, HTN, PVD, TIA, migraines, COPD, tobacco use, GERD, chronic pain, osteoarthritis, OSA, constipation. HLD, PTSD  Care Plan : General Plan of Care (Adult)  Updates made by Sara Medicus, RN since 11/23/2021 12:00 AM     Problem: Health Promotion or Disease Self-Management (General Plan of Care)   Priority: High  Onset Date: 12/20/2020     Long-Range Goal: Self-Management Plan Developed   Start Date: 12/20/2020  Expected End Date: 01/23/2022  Recent Progress: Not on track  Priority: High  Note:    Current Barriers:  Knowledge Deficits related to short term plan for care coordination needs and long term plans for chronic disease management needs.  Patient with many chronic health conditions. 56/2/23:  Patient  with ongoing constipation-to follow up with GI.  Also, patient with cough for 2 weeks, non productive.  Patient states she will follow up with PCP today. Neck pain continues along with carpal tunnel-to see surgeon 02/11/22.  Smokes 1/2 ppd-has not tried nicotine patches yet-she will follow up  with PCP.  Nurse Case Manager Clinical Goal(s):  patient will work with care management team to address care coordination and chronic disease management needs related to Disease Management Educational Needs Care Coordination Medication Management and Education Medication Reconciliation Medication Assistance  Psychosocial Support Mental Health Counseling   Interventions:  Evaluation of current treatment plan  and patient's adherence to plan as established by provider. Reviewed medications with patient. Collaborated with pharmacy regarding medications.Marland Kitchen  Collaborated with SW regarding anxiety. Discussed plans with patient for ongoing care management follow up and provided patient with  direct contact information for care management team Advised patient, providing education and rationale, to monitor blood pressure daily and record, calling PCP for findings outside established parameters.  Reviewed scheduled/upcoming provider appointments. Social Work referral for anxiety, PTSD-completed Pharmacy referral for medication review-completed. Patient given 1-800-QUITNOW for smoking cessation if interested.  Self Care Activities:  Patient will self administer medications as prescribed Patient will attend all scheduled provider appointments Patient will call pharmacy for medication refills Patient will continue to perform ADL's independently Patient will call provider office for new concerns or questions Patient Goals: In the next 90 days, patient will attend all scheduled appointments. - Follow Up Plan: The patient has been provided with contact information for the care management team and has been advised  to call with any health related questions or concerns.  The care management team will reach out to the patient again over the next 30 days.  Implement additional goals and interventions based on identified risk factors.    Follow Up:  Patient agrees to Care Plan and Follow-up.  Plan: The Managed Medicaid care management team will reach out to the patient again over the next 30 business  days. and The  Patient has been provided with contact information for the Managed Medicaid care management team and has been advised to call with any health related questions or concerns.  Date/time of next scheduled RN care management/care coordination outreach: 12/24/21 at 1230

## 2021-11-23 NOTE — Patient Instructions (Signed)
Hi Sara Munoz, thanks for speaking with me-I hope you feel better-please reach out to your PCP and GI provider, thank you!  Sara Munoz was given information about Medicaid Managed Care team care coordination services as a part of their [place] Complete Medicaid benefit. Sara Munoz verbally consented to engagement with the [facility] Managed Care team.   If you are experiencing a medical emergency, please call 911 or report to your local emergency department or urgent care.   If you have a non-emergency medical problem during routine business hours, please contact your provider's office and ask to speak with a nurse.   For questions related to your [place] Complete Medicaid health plan, please call: [phone]  If you would like to schedule transportation through your [place] Complete Medicaid plan, please call the following number at least 2 days in advance of your appointment: [phone].   There is no limit to the number of trips during the year between medical appointments, healthcare facilities, or pharmacies. Transportation must be scheduled at least 2 business days before but not more than thirty 30 days before of your appointment.  Call the [place] at [phone], at any time, 24 hours a day, 7 days a week. If you are in danger or need immediate medical attention call 911.  If you would like help to quit smoking, call 1-800-QUIT-NOW (210) 786-0671) OR Espaol: 1-855-Djelo-Ya (9-449-675-9163) o para ms informacin haga clic aqu or Text READY to 200-400 to register via text  [name] - following are the goals we discussed in your visit today:  Timeframe:  Long-Range Goal Priority:  High Start Date:       12/20/20                      Expected End Date:    ongoing                   Follow Up Date: 12/23/21 - schedule appointment for flu shot - schedule appointment for vaccines needed due to my age or health - schedule recommended health tests  (blood work, mammogram, colonoscopy, pap test) - schedule and keep appointment for annual check-up   11/23/21:  Patient with cough-to contact PCP today, sees surgeon 6/21   Why is this important?   Screening tests can find diseases early when they are easier to treat.  Your doctor or nurse will talk with you about which tests are important for you.  Getting shots for common diseases like the flu and shingles will help prevent them.    Patient verbalizes understanding of instructions and care plan provided today and agrees to view in [place]. Active MyChart status and patient understanding of how to access instructions and care plan via MyChart confirmed with patient.     The Managed Medicaid care management team will reach out to the patient again over the next 30 business days.  The  Patient  has been provided with contact information for the Managed Medicaid care management team and has been advised to call with any health related questions or concerns.   [name] RN, BSN [place] Management Coordinator - Managed Medicaid High Risk [phone]   Following is a copy of your plan of care:  Care Plan : General Plan of Care (Adult)  Updates made by [name], RN since 11/23/2021 12:00 AM     Problem: Health Promotion or Disease Self-Management (General Plan of Care)   Priority:  High  Onset Date: 12/20/2020     Long-Range Goal: Self-Management Plan Developed   Start Date: 12/20/2020  Expected End Date: 01/23/2022  Recent Progress: Not on track  Priority: High  Note:    Current Barriers:  Knowledge Deficits related to short term plan for care coordination needs and long term plans for chronic disease management needs.  Patient with many chronic health conditions. 56/2/23:  Patient with ongoing constipation-to follow up with GI.  Also, patient with cough for 2 weeks, non productive.  Patient states she will follow up with PCP today. Neck pain  continues along with carpal tunnel-to see surgeon 02/11/22.  Smokes 1/2 ppd-has not tried nicotine patches yet-she will follow up  with PCP.  Nurse Case Manager Clinical Goal(s):  patient will work with care management team to address care coordination and chronic disease management needs related to Disease Management Educational Needs Care Coordination Medication Management and Education Medication Reconciliation Medication Assistance  Psychosocial Support Mental Health Counseling   Interventions:  Evaluation of current treatment plan  and patient's adherence to plan as established by provider. Reviewed medications with patient. Collaborated with pharmacy regarding medications.Marland Kitchen  Collaborated with SW regarding anxiety. Discussed plans with patient for ongoing care management follow up and provided patient with direct contact information for care management team Advised patient, providing education and rationale, to monitor blood pressure daily and record, calling PCP for findings outside established parameters.  Reviewed scheduled/upcoming provider appointments. Social Work referral for anxiety, PTSD-completed Pharmacy referral for medication review-completed. Patient given 1-800-QUITNOW for smoking cessation if interested.  Self Care Activities:  Patient will self administer medications as prescribed Patient will attend all scheduled provider appointments Patient will call pharmacy for medication refills Patient will continue to perform ADL's independently Patient will call provider office for new concerns or questions Patient Goals: In the next 90 days, patient will attend all scheduled appointments. - Follow Up Plan: The patient has been provided with contact information for the care management team and has been advised to call with any health related questions or concerns.  The care management team will reach out to the patient again over the next 30 days.  Implement additional  goals and interventions based on identified risk factors.

## 2021-12-12 ENCOUNTER — Other Ambulatory Visit: Payer: Self-pay

## 2021-12-12 ENCOUNTER — Emergency Department: Payer: Medicaid Other

## 2021-12-12 ENCOUNTER — Emergency Department
Admission: EM | Admit: 2021-12-12 | Discharge: 2021-12-12 | Disposition: A | Discharge status: Home / Self Care | Payer: Medicaid Other | Attending: Emergency Medicine | Admitting: Emergency Medicine

## 2021-12-12 DIAGNOSIS — M4312 Spondylolisthesis, cervical region: Secondary | ICD-10-CM | POA: Insufficient documentation

## 2021-12-12 DIAGNOSIS — E876 Hypokalemia: Secondary | ICD-10-CM | POA: Diagnosis not present

## 2021-12-12 DIAGNOSIS — M5136 Other intervertebral disc degeneration, lumbar region: Secondary | ICD-10-CM | POA: Insufficient documentation

## 2021-12-12 DIAGNOSIS — K529 Noninfective gastroenteritis and colitis, unspecified: Secondary | ICD-10-CM | POA: Insufficient documentation

## 2021-12-12 DIAGNOSIS — R109 Unspecified abdominal pain: Secondary | ICD-10-CM | POA: Insufficient documentation

## 2021-12-12 DIAGNOSIS — J449 Chronic obstructive pulmonary disease, unspecified: Secondary | ICD-10-CM | POA: Insufficient documentation

## 2021-12-12 DIAGNOSIS — Z20822 Contact with and (suspected) exposure to covid-19: Secondary | ICD-10-CM | POA: Insufficient documentation

## 2021-12-12 DIAGNOSIS — W1839XA Other fall on same level, initial encounter: Secondary | ICD-10-CM | POA: Insufficient documentation

## 2021-12-12 DIAGNOSIS — R197 Diarrhea, unspecified: Secondary | ICD-10-CM | POA: Diagnosis not present

## 2021-12-12 DIAGNOSIS — R1084 Generalized abdominal pain: Secondary | ICD-10-CM | POA: Diagnosis not present

## 2021-12-12 DIAGNOSIS — R4182 Altered mental status, unspecified: Secondary | ICD-10-CM | POA: Diagnosis not present

## 2021-12-12 DIAGNOSIS — R531 Weakness: Secondary | ICD-10-CM | POA: Insufficient documentation

## 2021-12-12 DIAGNOSIS — G8929 Other chronic pain: Secondary | ICD-10-CM | POA: Diagnosis not present

## 2021-12-12 DIAGNOSIS — R58 Hemorrhage, not elsewhere classified: Secondary | ICD-10-CM | POA: Diagnosis not present

## 2021-12-12 DIAGNOSIS — I700 Atherosclerosis of aorta: Secondary | ICD-10-CM | POA: Insufficient documentation

## 2021-12-12 DIAGNOSIS — I10 Essential (primary) hypertension: Secondary | ICD-10-CM | POA: Insufficient documentation

## 2021-12-12 DIAGNOSIS — Z9181 History of falling: Secondary | ICD-10-CM | POA: Insufficient documentation

## 2021-12-12 DIAGNOSIS — M545 Low back pain, unspecified: Secondary | ICD-10-CM | POA: Insufficient documentation

## 2021-12-12 DIAGNOSIS — M47812 Spondylosis without myelopathy or radiculopathy, cervical region: Secondary | ICD-10-CM | POA: Diagnosis not present

## 2021-12-12 DIAGNOSIS — R52 Pain, unspecified: Secondary | ICD-10-CM | POA: Diagnosis not present

## 2021-12-12 DIAGNOSIS — R42 Dizziness and giddiness: Secondary | ICD-10-CM | POA: Diagnosis not present

## 2021-12-12 LAB — CBC WITH DIFFERENTIAL/PLATELET
Abs Immature Granulocytes: 0.07 10*3/uL (ref 0.00–0.07)
Basophils Absolute: 0 10*3/uL (ref 0.0–0.1)
Basophils Relative: 0 %
Eosinophils Absolute: 0 10*3/uL (ref 0.0–0.5)
Eosinophils Relative: 0 %
HCT: 44.8 % (ref 36.0–46.0)
Hemoglobin: 13.6 g/dL (ref 12.0–15.0)
Immature Granulocytes: 1 %
Lymphocytes Relative: 18 %
Lymphs Abs: 2.3 10*3/uL (ref 0.7–4.0)
MCH: 21.7 pg — ABNORMAL LOW (ref 26.0–34.0)
MCHC: 30.4 g/dL (ref 30.0–36.0)
MCV: 71.6 fL — ABNORMAL LOW (ref 80.0–100.0)
Monocytes Absolute: 0.7 10*3/uL (ref 0.1–1.0)
Monocytes Relative: 6 %
Neutro Abs: 9.4 10*3/uL — ABNORMAL HIGH (ref 1.7–7.7)
Neutrophils Relative %: 75 %
Platelets: 326 10*3/uL (ref 150–400)
RBC: 6.26 MIL/uL — ABNORMAL HIGH (ref 3.87–5.11)
RDW: 19.7 % — ABNORMAL HIGH (ref 11.5–15.5)
WBC: 12.5 10*3/uL — ABNORMAL HIGH (ref 4.0–10.5)
nRBC: 0 % (ref 0.0–0.2)

## 2021-12-12 LAB — URINALYSIS, ROUTINE W REFLEX MICROSCOPIC
Bilirubin Urine: NEGATIVE
Glucose, UA: NEGATIVE mg/dL
Hgb urine dipstick: NEGATIVE
Ketones, ur: NEGATIVE mg/dL
Nitrite: NEGATIVE
Protein, ur: NEGATIVE mg/dL
Specific Gravity, Urine: 1.044 — ABNORMAL HIGH (ref 1.005–1.030)
pH: 5 (ref 5.0–8.0)

## 2021-12-12 LAB — SARS CORONAVIRUS 2 BY RT PCR: SARS Coronavirus 2 by RT PCR: NEGATIVE

## 2021-12-12 LAB — COMPREHENSIVE METABOLIC PANEL
ALT: 14 U/L (ref 0–44)
AST: 24 U/L (ref 15–41)
Albumin: 4.6 g/dL (ref 3.5–5.0)
Alkaline Phosphatase: 93 U/L (ref 38–126)
Anion gap: 13 (ref 5–15)
BUN: 18 mg/dL (ref 6–20)
CO2: 16 mmol/L — ABNORMAL LOW (ref 22–32)
Calcium: 10.1 mg/dL (ref 8.9–10.3)
Chloride: 103 mmol/L (ref 98–111)
Creatinine, Ser: 1.04 mg/dL — ABNORMAL HIGH (ref 0.44–1.00)
GFR, Estimated: >60 mL/min (ref >60)
Glucose, Bld: 110 mg/dL — ABNORMAL HIGH (ref 70–99)
Potassium: 3.5 mmol/L (ref 3.5–5.1)
Sodium: 132 mmol/L — ABNORMAL LOW (ref 135–145)
Total Bilirubin: 0.6 mg/dL (ref 0.3–1.2)
Total Protein: 8.7 g/dL — ABNORMAL HIGH (ref 6.5–8.1)

## 2021-12-12 LAB — MAGNESIUM: Magnesium: 2 mg/dL (ref 1.7–2.4)

## 2021-12-12 LAB — TROPONIN I (HIGH SENSITIVITY): Troponin I (High Sensitivity): 5 ng/L (ref <18)

## 2021-12-12 LAB — T4, FREE: Free T4: 0.84 ng/dL (ref 0.61–1.12)

## 2021-12-12 LAB — LIPASE, BLOOD: Lipase: 44 U/L (ref 11–51)

## 2021-12-12 LAB — TSH: TSH: 1.456 u[IU]/mL (ref 0.350–4.500)

## 2021-12-12 MED ORDER — FENTANYL CITRATE PF 50 MCG/ML IJ SOSY
50.0000 ug | PREFILLED_SYRINGE | Freq: Once | INTRAMUSCULAR | Status: AC
  Administered 2021-12-12: 50 ug via INTRAVENOUS
  Filled 2021-12-12: qty 1

## 2021-12-12 MED ORDER — ONDANSETRON HCL 4 MG/2ML IJ SOLN
4.0000 mg | Freq: Once | INTRAMUSCULAR | Status: AC
Start: 2021-12-12 — End: 2021-12-12
  Administered 2021-12-12: 4 mg via INTRAVENOUS
  Filled 2021-12-12: qty 2

## 2021-12-12 MED ORDER — LIDOCAINE 5 % EX PTCH: 1.0000 | MEDICATED_PATCH | Freq: Two times a day (BID) | CUTANEOUS | 0 refills | Status: AC

## 2021-12-12 MED ORDER — OXYCODONE HCL 5 MG PO TABS
5.0000 mg | ORAL_TABLET | Freq: Once | ORAL | Status: AC
  Administered 2021-12-12: 5 mg via ORAL
  Filled 2021-12-12: qty 1

## 2021-12-12 MED ORDER — ONDANSETRON 4 MG PO TBDP: 4.0000 mg | ORAL_TABLET | Freq: Three times a day (TID) | ORAL | 0 refills | Status: AC | PRN

## 2021-12-12 MED ORDER — IOHEXOL 300 MG/ML  SOLN
100.0000 mL | Freq: Once | INTRAMUSCULAR | Status: AC | PRN
  Administered 2021-12-12: 100 mL via INTRAVENOUS

## 2021-12-12 MED ORDER — HYDROMORPHONE HCL 1 MG/ML IJ SOLN
0.5000 mg | Freq: Once | INTRAMUSCULAR | Status: AC
  Administered 2021-12-12: 0.5 mg via INTRAVENOUS
  Filled 2021-12-12: qty 0.5

## 2021-12-12 MED ORDER — PANTOPRAZOLE SODIUM 40 MG IV SOLR
40.0000 mg | Freq: Once | INTRAVENOUS | Status: AC
  Administered 2021-12-12: 40 mg via INTRAVENOUS
  Filled 2021-12-12: qty 10

## 2021-12-12 NOTE — ED Notes (Signed)
Pt unable to void or have a BM. Provider made aware. Pt also requesting more pain meds. Provider aware

## 2021-12-12 NOTE — Discharge Instructions (Addendum)
Follow-up with your primary care doctor for incidental findings on your CT imaging.  You may need to follow-up with a cardiologist.  I prescribed some Zofran.  Can take Tylenol 1 g every 8 hours.  Avoid ibuprofen as this can upset your stomach.  Use the lidocaine patches to help with pain.  No acute intra-abdominal or intrapelvic abnormalities.   Scattered atherosclerotic calcifications including coronary arteries.   Aortic Atherosclerosis (ICD10-I70.0

## 2021-12-12 NOTE — ED Triage Notes (Signed)
Pt here via [facility] with abd pain, weakness, and diarrhea x3 days. Pt state she has not eaten in 3 days as well and well as having urinary retention. Pt has hx of COPD, HTN, and high cholesterol.   158/90 98% RA 126-cbg 20G L \\hand

## 2021-12-12 NOTE — ED Provider Notes (Signed)
[facility] Provider Note    Event Date/Time   First MD Initiated Contact with Patient 12/12/21 [phone]     (approximate)   History   Abdominal Pain   HPI  Sara Munoz is a 56 y.o. female with history of COPD, hypertension who comes in with concerns for abdominal pain and weakness.  Patient reports having a lot of weakness over the past few days.  She reports significant abdominal pain in her abdomen that is throughout her abdomen.  She reports multiple episodes of diarrhea which she reports is too many to count that is very watery in nature.  Denies any recent antibiotic use.  She reports having a lot of back pain in her lower back after having a fall 3 days ago where she just felt really weak and then fell to the ground.  She reports multiple episodes of vomiting as well.   I reviewed the GI note from 07/11/2021 where patient was seen by GI for chronic abdominal pain.   Physical Exam   Triage Vital Signs: ED Triage Vitals [12/12/21 0902]  Enc Vitals Group     BP (!) 130/109     Pulse Rate 80     Resp (!) 24     Temp      Temp src      SpO2 100 %     Weight      Height      Head Circumference      Peak Flow      Pain Score 10     Pain Loc      Pain Edu?      Excl. in [place]?     Most recent vital signs: Vitals:   12/12/21 0902  BP: (!) 130/109  Pulse: 80  Resp: (!) 24  SpO2: 100%     General: Awake, no distress.  CV:  Good peripheral perfusion.  Resp:  Normal effort.  Abd:  Slightly tender in her abdomen. Other:  Equal strength in her arms and legs.  No sensation changes that are new in her legs Some mild C-spine tenderness but that seems to be more at her baseline.  Some mild L-spine tenderness.  ED Results / Procedures / Treatments   Labs (all labs ordered are listed, but only abnormal results are displayed) Labs Reviewed  CBC WITH DIFFERENTIAL/PLATELET - Abnormal; Notable for the following components:      Result Value   WBC  12.5 (*)    RBC 6.26 (*)    MCV 71.6 (*)    MCH 21.7 (*)    RDW 19.7 (*)    Neutro Abs 9.4 (*)    All other components within normal limits  COMPREHENSIVE METABOLIC PANEL - Abnormal; Notable for the following components:   Sodium 132 (*)    CO2 16 (*)    Glucose, Bld 110 (*)    Creatinine, Ser 1.04 (*)    Total Protein 8.7 (*)    All other components within normal limits  URINALYSIS, ROUTINE W REFLEX MICROSCOPIC - Abnormal; Notable for the following components:   Color, Urine YELLOW (*)    APPearance CLEAR (*)    Specific Gravity, Urine 1.044 (*)    Leukocytes,Ua TRACE (*)    Bacteria, UA RARE (*)    All other components within normal limits  SARS CORONAVIRUS 2 BY RT PCR  C DIFFICILE QUICK SCREEN W PCR REFLEX    GASTROINTESTINAL PANEL BY PCR, STOOL (REPLACES STOOL CULTURE)  LIPASE, BLOOD  TSH  T4, FREE  MAGNESIUM  TROPONIN I (HIGH SENSITIVITY)  TROPONIN I (HIGH SENSITIVITY)     EKG  My interpretation of EKG:  Normal sinus rate of 68 without any ST elevation or T wave inversions, normal intervals  RADIOLOGY I have reviewed the CT head personally do not see any evidence of intracranial hemorrhage   PROCEDURES:  Critical Care performed: No  .1-3 Lead EKG Interpretation  Performed by: Vanessa Glenbeulah, MD Authorized by: Vanessa Pitman, MD     Interpretation: normal     ECG rate:  60   ECG rate assessment: normal     Rhythm: sinus rhythm     Ectopy: none     Conduction: normal      MEDICATIONS ORDERED IN ED: Medications  oxyCODONE (Oxy IR/ROXICODONE) immediate release tablet 5 mg (has no administration in time range)  pantoprazole (PROTONIX) injection 40 mg (40 mg Intravenous Given 12/12/21 0918)  ondansetron (ZOFRAN) injection 4 mg (4 mg Intravenous Given 12/12/21 0918)  fentaNYL (SUBLIMAZE) injection 50 mcg (50 mcg Intravenous Given 12/12/21 0918)  iohexol (OMNIPAQUE) 300 MG/ML solution 100 mL (100 mLs Intravenous Contrast Given 12/12/21 1020)  HYDROmorphone  (DILAUDID) injection 0.5 mg (0.5 mg Intravenous Given 12/12/21 1036)     IMPRESSION / MDM / ASSESSMENT AND PLAN / ED COURSE  I reviewed the triage vital signs and the nursing notes.   Patient's presentation is most consistent with acute presentation with potential threat to life or bodily function.   Differential includes intercranial hemorrhage, cervical fracture, lumbar fracture, bowel obstruction, perforation.  Will get CT head cervical and abdomen.    10:16 AM reevaluated patient she is denying any chest pain, shortness of breath to the CT PE.  She denies any upper thoracic back pain.  She reports only having the pain in her abdomen and the pain in her low back from the fall.  She denies any new numbness or weakness in her legs.   1. No evidence of acute fracture to the cervical spine. 2. Nonspecific reversal of the expected cervical lordosis. 3. Mild grade 1 anterolisthesis at C3-C4 and C4-C5. 4. Postoperative changes to the cervical spine, as described. No evidence of acute hardware compromise.   1. No evidence of acute intracranial abnormality. 2. Partially empty sella turcica. While this finding often reflects incidental anatomic variation, it can also be associated with idiopathic intracranial hypertension (pseudotumor cerebri).  IMPRESSION: No acute intra-abdominal or intrapelvic abnormalities.   Scattered atherosclerotic calcifications including coronary arteries.   Aortic Atherosclerosis (ICD10-I70.0).    IMPRESSION: 1. Status post laminectomy and PLIF at L5-S1 with stable residual right lateral recess and right foraminal narrowing. No evidence of hardware loosening. 2. Stable mild disc bulging, facet and ligamentous hypertrophy at L3-4 and L4-5. 3. No acute lumbar spine findings.   2:08 PM reevaluated patient she is tolerating p.o.  She reports feeling better but she still having a little bit of back pain but that is more of her chronic pain that she is  followed by pain clinic.  We discussed using lidocaine patches, Tylenol and avoiding ibuprofen.  We discussed Zofran to help with any nausea.  Patient has no signs of cord compression and feels comfortable with discharge home   The patient is on the cardiac monitor to evaluate for evidence of arrhythmia and/or significant heart rate changes.      FINAL CLINICAL IMPRESSION(S) / ED DIAGNOSES   Final diagnoses:  Gastroenteritis  Chronic back pain, unspecified back  location, unspecified back pain laterality     Rx / DC Orders   ED Discharge Orders          Ordered    lidocaine (LIDODERM) 5 %  Every 12 hours        12/12/21 1414    ondansetron (ZOFRAN-ODT) 4 MG disintegrating tablet  Every 8 hours PRN        12/12/21 1414             Note:  This document was prepared using Dragon voice recognition software and may include unintentional dictation errors.   Vanessa T[place]in, MD 12/12/21 1414

## 2021-12-12 NOTE — ED Notes (Signed)
Patient transported to CT 

## 2021-12-20 DIAGNOSIS — M542 Cervicalgia: Secondary | ICD-10-CM | POA: Diagnosis not present

## 2021-12-20 DIAGNOSIS — M79622 Pain in left upper arm: Secondary | ICD-10-CM | POA: Diagnosis not present

## 2021-12-20 DIAGNOSIS — M5136 Other intervertebral disc degeneration, lumbar region: Secondary | ICD-10-CM | POA: Diagnosis not present

## 2021-12-20 DIAGNOSIS — M79661 Pain in right lower leg: Secondary | ICD-10-CM | POA: Diagnosis not present

## 2021-12-20 DIAGNOSIS — M545 Low back pain, unspecified: Secondary | ICD-10-CM | POA: Diagnosis not present

## 2021-12-20 DIAGNOSIS — M25511 Pain in right shoulder: Secondary | ICD-10-CM | POA: Diagnosis not present

## 2021-12-20 DIAGNOSIS — G894 Chronic pain syndrome: Secondary | ICD-10-CM | POA: Diagnosis not present

## 2021-12-20 DIAGNOSIS — Z79891 Long term (current) use of opiate analgesic: Secondary | ICD-10-CM | POA: Diagnosis not present

## 2021-12-20 DIAGNOSIS — G890 Central pain syndrome: Secondary | ICD-10-CM | POA: Diagnosis not present

## 2021-12-20 DIAGNOSIS — M79662 Pain in left lower leg: Secondary | ICD-10-CM | POA: Diagnosis not present

## 2021-12-20 DIAGNOSIS — M79621 Pain in right upper arm: Secondary | ICD-10-CM | POA: Diagnosis not present

## 2021-12-20 DIAGNOSIS — M25512 Pain in left shoulder: Secondary | ICD-10-CM | POA: Diagnosis not present

## 2021-12-24 ENCOUNTER — Other Ambulatory Visit: Payer: Self-pay | Admitting: Obstetrics and Gynecology

## 2021-12-24 NOTE — Patient Outreach (Signed)
Medicaid Managed Care   Nurse Care Manager Note  12/24/2021 Name:  Sara Munoz MRN:  [id number] DOB:  January 27, 1966  Sara Munoz is an 56 y.o. year old female who is a primary patient of [name], [name] (Inactive).  The [facility] Managed Care Coordination team was consulted for assistance with:    Chronic healthcare management needs, CAD, HTN, migraines, COPD, tobacco use, chronic pain, osteoarthritis, OSA, constipation , PTSD, GERD  Sara Munoz was given information about Medicaid Managed Care Coordination team services today. [name] Patient agreed to services and verbal consent obtained.  Engaged with patient by telephone for follow up visit in response to provider referral for case management and/or care coordination services.   Assessments/Interventions:  Review of past medical history, allergies, medications, health status, including review of consultants reports, laboratory and other test data, was performed as part of comprehensive evaluation and provision of chronic care management services.  SDOH (Social Determinants of Health) assessments and interventions performed: SDOH Interventions    Flowsheet Row Most Recent Value  SDOH Interventions   Physical Activity Interventions Other (Comments)  [unable to participate in this type of exercise]      Care Plan  Allergies  Allergen Reactions   Flexeril [Cyclobenzaprine] Hives and Swelling    Facial/lip swelling      Levofloxacin Hives and Swelling   Phenergan [Promethazine Hcl] Other (See Comments)    Agitation.    Toradol [Ketorolac Tromethamine] Swelling and Other (See Comments)    Facial/tongue swelling    Tramadol Hives, Swelling and Other (See Comments)    Lip swelling    Zoloft [Sertraline Hcl] Swelling    Tongue swelling      Medications Reviewed Today     Reviewed by [name], RN (Registered Nurse) on 12/24/21 at 1248  Med List Status: <None>   Medication Order Taking? Sig Documenting  Provider Last Dose Status Informant  albuterol (PROVENTIL) (2.5 MG/3ML) 0.083% nebulizer solution [id number] No Take 3 mLs (2.5 mg total) by nebulization every 6 (six) hours as needed for wheezing or shortness of breath. [name], MD Taking Active Self  albuterol (VENTOLIN HFA) 108 (90 Base) MCG/ACT inhaler [id number] No INHALE TWO PUFFS BY MOUTH INTO LUNGS EVERY 6 HOURS AS NEEDED FOR SHORTNESS OF BREATH OR wheezing Jerrol Banana., MD Taking Active   amLODipine [facility]) 5 MG tablet [id number] No TAKE ONE TABLET BY MOUTH ONCE DAILY Jerrol Banana., MD Taking Active   atorvastatin (LIPITOR) 20 MG tablet [id number] No TAKE ONE TABLET BY MOUTH EVERY EVENING Jerrol Banana., MD Taking Active   docusate sodium (COLACE) 100 MG capsule [id number] No Take 100 mg by mouth 2 (two) times daily. [provider] Taking Active Self  doxycycline (VIBRA-TABS) 100 MG tablet [id number] No Take 1 tablet by mouth in the morning and at bedtime. [provider] Taking Active   DULoxetine (CYMBALTA) 30 MG capsule [id number] No Take 30 mg by mouth 2 (two) times daily. [provider] Taking Active Self  Erenumab-aooe (AIMOVIG) 70 MG/ML SOAJ [id number] No Inject 70 mg into the skin every 30 (thirty) days. [name], MD Taking Active   gabapentin (NEURONTIN) 600 MG tablet [id number] No Take 600 mg by mouth 4 (four) times daily. [provider] Taking Active Self  hydrOXYzine (ATARAX) 50 MG tablet [id number] No Take 50 mg by mouth 3 (three) times daily. [provider] Taking Active   lactulose ([place]) 10 GM/15ML solution [id number]  Take 30 mLs (20 g total) by mouth 4 (four) times daily. [name], MD  Active   LINZESS 290 MCG CAPS capsule [id number]  Take 290 mcg by mouth every morning. [provider]  Active   lisinopril (ZESTRIL) 40 MG tablet [id number] No TAKE ONE TABLET BY MOUTH EVERY EVENING Jerrol Banana., MD Taking Active    metoprolol tartrate (LOPRESSOR) 25 MG tablet [id number]  TAKE 1 TABLET BY MOUTH TWICE A DAY Jerrol Banana., MD  Active   mirtazapine (REMERON) 7.5 MG tablet [id number]  Take 7.5 mg by mouth at bedtime. [provider]  Active   naloxone [facility]) 4 MG/0.1ML LIQD nasal spray kit [id number] No Place 1 spray into the nose as needed (opioid overdose). [provider] Taking Active Self           Med Note Ivor Reining May 03, 2021 11:23 AM)    Naloxone HCl [facility]) [id number] No Inject 1 Dose as directed as needed (opioid overdose). [provider] Taking Active Self           Med Note Ivor Reining May 03, 2021 11:32 AM)    naproxen (NAPROSYN) 500 MG tablet [id number] No Take 500 mg by mouth 2 (two) times daily. [provider] Taking Active   omeprazole (PRILOSEC) 40 MG capsule [id number]  TAKE 1 CAPSULE BY MOUTH TWICE A DAY [name], MD  Active   oxyCODONE (ROXICODONE) 15 MG immediate release tablet [id number]  Take 15 mg by mouth. [provider]  Active   SPIRIVA RESPIMAT 2.5 MCG/ACT AERS [id number] No INHALE 1 PUFF BY MOUTH INTO LUNGS ONCE DAILY Bacigalupo, Dionne Bucy, MD Taking Active Self  sucralfate (CARAFATE) 1 GM/10ML suspension [id number]  TAKE 10 MLS (1 G TOTAL) BY MOUTH 4 (FOUR) TIMES DAILY. [name], MD  Active   SUMAtriptan [facility]) 25 MG tablet [id number] No May repeat in 2 hours if headache persists or recurs. [name], MD Taking Active   SYMBICORT 160-4.5 MCG/ACT inhaler [id number] No INHALE TWO PUFFS BY MOUTH INTO LUNGS IN THE MORNING and AT BEDTIME Jerrol Banana., MD Taking Active   tiZANidine (ZANAFLEX) 4 MG tablet [id number] No Take 1 tablet (4 mg total) by mouth every 6 (six) hours as needed for muscle spasms. 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[place]) 05/14/2021   Lumbar radiculopathy 05/23/2020   Sacroiliac inflammation ([place]) 11/25/2019   Sepsis ([place]) 10/19/2018   Severe sepsis ([place]) 10/19/2018   Cervical radiculopathy 02/12/2018   Chronic migraine 02/12/2018   Paresthesia 08/29/2017   Neck pain 08/29/2017   Daily headache 08/29/2017   Chronic active hepatitis ([place]) 07/29/2017   Neurogenic pain 07/31/2016   Chronic pain syndrome 06/05/2016   Carotid artery stenosis 02/27/2016   Chronic constipation 02/27/2016   Chronic nausea 02/27/2016   Hypercholesterolemia 02/27/2016   Osteoarthritis 02/27/2016   PTSD (post-traumatic stress disorder) 02/27/2016   TIA (transient ischemic attack) 02/27/2016   Long term current use of opiate analgesic 02/27/2016   Long term prescription opiate use 02/27/2016   Encounter for pain management consult 02/27/2016   Chronic hip pain (Location of Tertiary source of  pain) (Bilateral) (L>R) 02/27/2016   Chronic knee pain (Bilateral) (L>R) 02/27/2016   Chronic shoulder pain (Bilateral) (L>R) 02/27/2016   Chronic sacroiliac joint pain (Bilateral) (L>R) 02/27/2016   Chronic low back pain (Location of Primary Source of Pain) (Bilateral) (L>R) 02/27/2016   Chronic lower extremity pain (Location of Secondary source of pain) (Bilateral) (L>R) 02/27/2016   Osteoarthritis of hip (Bilateral) (L>R) 02/27/2016   Chronic neck pain (posterior midline) (Bilateral) (L>R) 02/27/2016   Cervicogenic headache (Bilateral) (L>R) 02/27/2016   Occipital headache (Bilateral) (L>R) 02/27/2016   Chronic upper extremity pain (Bilateral) (L>R) 02/27/2016   Chronic Cervical radicular pain (Bilateral) (L>R) 02/27/2016   Chronic lumbar radicular pain (Right) (L5 dermatome) 02/27/2016   Lumbar facet syndrome (Bilateral) (L>R) 02/27/2016   Long term prescription benzodiazepine use 02/27/2016   Hypertension     Chronic obstructive pulmonary disease ([place]) 10/09/2015   GERD (gastroesophageal reflux disease) 10/09/2015   Non-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/2014 08/02/2015   Hyperlipidemia 08/02/2015   Costochondritis 08/02/2015   Drug abuse, 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[place]) 09/03/2014   GAD (generalized anxiety disorder) 09/03/2014   Narcotic dependence ([place]) 09/03/2014   PVD (peripheral vascular disease) ([place]) 09/03/2014   Smoker 09/03/2014   Cigarette nicotine dependence with nicotine-induced disorder 07/08/2014   Anemia of chronic disease 03/19/2014   Narcotic abuse, continuous ([place]) 03/19/2014   Polysubstance abuse ([place]) 03/19/2014   MSSA (methicillin susceptible Staphylococcus aureus) septicemia ([place]) 03/07/2014   Hypomagnesemia 01/04/2014   Chronic cough 09/04/2011   Sleep apnea, obstructive 09/04/2011   Sinusitis, acute 09/04/2011   Conditions to be addressed/monitored per PCP order:  Chronic healthcare management needs, CAD, HTN, migraines, COPD, tobacco use, chronic pain, osteoarthritis, OSA, constipation  Care Plan : General Plan of Care (Adult)  Updates made by [name], RN since 12/24/2021 12:00 AM     Problem: Health Promotion or Disease Self-Management (General Plan of Care)   Priority: High  Onset Date: 12/20/2020     Long-Range Goal: Self-Management Plan Developed   Start Date: 12/20/2020  Expected End Date: 03/26/2022  Recent Progress: Not on track  Priority: High  Note:    Current Barriers:  Knowledge Deficits related to short term plan for care coordination needs and long term plans for chronic disease management needs.  Patient with many chronic health conditions. 12/24/21:  Patient with ongoing neck and back pain-has neuro, surgery, and pain mgt appts this month-also to have MRI.  Seen in ED 6/21 for abdominal pain and weakness-improved.  Has f/u GI appt 8/31.  To follow up with PCP and CARDS regarding labile BP.  No change in smoking-1/2 ppd or less-to check with PCP about patches.  Nurse  Case Manager Clinical Goal(s):  patient will work with care management team to address care coordination and chronic disease management needs related to Disease Management Educational Needs Care Coordination Medication Management and Education Medication Reconciliation Medication Assistance  Psychosocial Support Mental Health Counseling   Interventions:  Evaluation of current treatment plan  and patient's adherence to plan as established by provider. Reviewed medications with patient. Collaborated with pharmacy regarding medications.Marland Kitchen  Collaborated with SW regarding anxiety. Discussed plans with patient for ongoing care management follow up and provided patient with direct contact information for care management team Advised patient, providing education and rationale, to monitor blood pressure daily and record, calling PCP for findings outside established parameters.  Reviewed scheduled/upcoming provider appointments. Social Work referral for anxiety, PTSD-completed Pharmacy referral for medication review-completed. Patient given 1-800-QUITNOW for smoking cessation if interested.  Self Care Activities:  Patient will self administer medications as prescribed Patient will attend all scheduled provider appointments Patient will call pharmacy for medication refills Patient will continue to perform ADL's independently Patient will call provider office for new concerns or questions Patient Goals: In the next 90 days, patient will attend all scheduled appointments. - Follow Up Plan: The patient has been provided with contact information for the care management team and has been advised to call with any health related questions or concerns.  The care management team will reach out to the patient again over the next 30 business  days.  Implement additional goals and interventions based on identified risk factors.    Follow Up:  Patient agrees to Care Plan and Follow-up.  Plan: The Managed  Medicaid care management team will reach out to the patient again over the next 30 business days. and The  Patient has been provided with contact information for the Managed Medicaid care management team and has been advised to call with any health related questions or concerns.  Date/time of next scheduled RN care management/care coordination outreach:  01/30/22 at 1230.

## 2021-12-24 NOTE — Patient Instructions (Signed)
Hi Sara Munoz hope you feel better and your appointments go well!  Sara Munoz was given information about Medicaid Managed Care Munoz care coordination services as a part of their [place] Complete Medicaid benefit. Sara Munoz verbally consented to engagement with the Sara Munoz.   If you are experiencing a medical emergency, please call 911 or report to your local emergency department or urgent care.   If you have a non-emergency medical problem during routine business hours, please contact your provider's office and ask to speak with a nurse.   For questions related to your [place] Complete Medicaid health plan, please call: [phone]  If you would like to schedule transportation through your [place] Complete Medicaid plan, please call the following number at least 2 days in advance of your appointment: [phone].   There is no limit to the number of trips during the year between medical appointments, healthcare facilities, or pharmacies. Transportation must be scheduled at least 2 business days before but not more than thirty 30 days before of your appointment.  Call the [facility] at [phone], at any time, 24 hours a day, 7 days a week. If you are in danger or need immediate medical attention call 911.  If you would like help to quit smoking, call 1-800-QUIT-NOW 530-315-4748) OR Espaol: 1-855-Djelo-Ya (3-536-144-3154) o para ms informacin haga clic aqu or Text READY to 200-400 to register via text  [name] - following are the goals we discussed in your visit today:  Timeframe:  Long-Range Goal Priority:  High Start Date:       12/20/20                      Expected End Date:    ongoing                   Follow Up Date: 01/30/22 - schedule appointment for flu shot - schedule appointment for vaccines needed due to my age or health - schedule recommended health tests (blood work, mammogram, colonoscopy, pap test) - schedule and  keep appointment for annual check-up   12/24/21:  Patient with multiple provider appts scheduled, neuro, surgery, and pain mgt. This month   Why is this important?   Screening tests can find diseases early when they are easier to treat.  Your doctor or nurse will talk with you about which tests are important for you.  Getting shots for common diseases like the flu and shingles will help prevent them  Patient verbalizes understanding of instructions and care plan provided today and agrees to view in [place]. Active MyChart status and patient understanding of how to access instructions and care plan via MyChart confirmed with patient.     The Managed Medicaid care management Munoz will reach out to the patient again over the next 30 business days.  The  Patient has been provided with contact information for the Managed Medicaid care management Munoz and has been advised to call with any health related questions or concerns.   [name] RN, BSN [facility] Management Coordinator - Managed Medicaid High Risk [phone]   Following is a copy of your plan of care:  Care Plan : General Plan of Care (Adult)  Updates made by [name], RN since 12/24/2021 12:00 AM     Problem: Health Promotion or Disease Self-Management (General Plan of Care)   Priority: High  Onset Date: 12/20/2020  Long-Range Goal: Self-Management Plan Developed   Start Date: 12/20/2020  Expected End Date: 03/26/2022  Recent Progress: Not on track  Priority: High  Note:    Current Barriers:  Knowledge Deficits related to short term plan for care coordination needs and long term plans for chronic disease management needs.  Patient with many chronic health conditions. 12/24/21:  Patient with ongoing neck and back pain-has neuro, surgery, and pain mgt appts this month-also to have MRI.  Seen in ED 6/21 for abdominal pain and weakness-improved.  Has f/u GI appt 8/31.  To follow up with PCP  and CARDS regarding labile BP.  No change in smoking-1/2 ppd or less-to check with PCP about patches.  Nurse Case Manager Clinical Goal(s):  patient will work with care management Munoz to address care coordination and chronic disease management needs related to Disease Management Educational Needs Care Coordination Medication Management and Education Medication Reconciliation Medication Assistance  Psychosocial Support Mental Health Counseling   Interventions:  Evaluation of current treatment plan  and patient's adherence to plan as established by provider. Reviewed medications with patient. Collaborated with pharmacy regarding medications.Marland Kitchen  Collaborated with SW regarding anxiety. Discussed plans with patient for ongoing care management follow up and provided patient with direct contact information for care management Munoz Advised patient, providing education and rationale, to monitor blood pressure daily and record, calling PCP for findings outside established parameters.  Reviewed scheduled/upcoming provider appointments. Social Work referral for anxiety, PTSD-completed Pharmacy referral for medication review-completed. Patient given 1-800-QUITNOW for smoking cessation if interested.  Self Care Activities:  Patient will self administer medications as prescribed Patient will attend all scheduled provider appointments Patient will call pharmacy for medication refills Patient will continue to perform ADL's independently Patient will call provider office for new concerns or questions Patient Goals: In the next 90 days, patient will attend all scheduled appointments. - Follow Up Plan: The patient has been provided with contact information for the care management Munoz and has been advised to call with any health related questions or concerns.  The care management Munoz will reach out to the patient again over the next 30 business  days.  Implement additional goals and interventions based  on identified risk factors.

## 2022-01-01 ENCOUNTER — Ambulatory Visit: Payer: Medicaid Other | Admitting: Neurology

## 2022-01-04 ENCOUNTER — Other Ambulatory Visit: Payer: Self-pay | Admitting: Anesthesiology

## 2022-01-04 DIAGNOSIS — M542 Cervicalgia: Secondary | ICD-10-CM

## 2022-01-04 DIAGNOSIS — M5412 Radiculopathy, cervical region: Secondary | ICD-10-CM | POA: Diagnosis not present

## 2022-01-04 DIAGNOSIS — M47812 Spondylosis without myelopathy or radiculopathy, cervical region: Secondary | ICD-10-CM

## 2022-01-04 DIAGNOSIS — S129XXA Fracture of neck, unspecified, initial encounter: Secondary | ICD-10-CM | POA: Diagnosis not present

## 2022-01-04 DIAGNOSIS — M47816 Spondylosis without myelopathy or radiculopathy, lumbar region: Secondary | ICD-10-CM | POA: Diagnosis not present

## 2022-01-04 DIAGNOSIS — Z6831 Body mass index (BMI) 31.0-31.9, adult: Secondary | ICD-10-CM | POA: Diagnosis not present

## 2022-01-04 DIAGNOSIS — M5030 Other cervical disc degeneration, unspecified cervical region: Secondary | ICD-10-CM

## 2022-01-04 DIAGNOSIS — G5600 Carpal tunnel syndrome, unspecified upper limb: Secondary | ICD-10-CM | POA: Diagnosis not present

## 2022-01-07 ENCOUNTER — Ambulatory Visit: Payer: Medicaid Other | Admitting: Neurology

## 2022-01-10 ENCOUNTER — Telehealth: Payer: Self-pay | Admitting: Physician Assistant

## 2022-01-10 ENCOUNTER — Other Ambulatory Visit: Payer: Self-pay | Admitting: Family Medicine

## 2022-01-10 DIAGNOSIS — I10 Essential (primary) hypertension: Secondary | ICD-10-CM

## 2022-01-10 NOTE — Telephone Encounter (Signed)
[facility] faxed refill request for the following medications:   amLODipine (NORVASC) 5 MG tablet   Please advise.  

## 2022-01-11 ENCOUNTER — Ambulatory Visit
Admission: RE | Admit: 2022-01-11 | Discharge: 2022-01-11 | Disposition: A | Discharge status: Home / Self Care | Payer: Medicaid Other | Attending: Anesthesiology | Admitting: Anesthesiology

## 2022-01-11 ENCOUNTER — Ambulatory Visit: Admission: RE | Admit: 2022-01-11 | Payer: Medicaid Other | Source: Ambulatory Visit

## 2022-01-11 ENCOUNTER — Ambulatory Visit
Admission: RE | Admit: 2022-01-11 | Discharge: 2022-01-11 | Disposition: A | Discharge status: Home / Self Care | Payer: Medicaid Other | Source: Ambulatory Visit | Attending: Anesthesiology | Admitting: Anesthesiology

## 2022-01-11 ENCOUNTER — Other Ambulatory Visit: Payer: Self-pay | Admitting: Anesthesiology

## 2022-01-11 DIAGNOSIS — M542 Cervicalgia: Secondary | ICD-10-CM

## 2022-01-15 ENCOUNTER — Telehealth: Payer: Self-pay | Admitting: Physician Assistant

## 2022-01-15 DIAGNOSIS — I10 Essential (primary) hypertension: Secondary | ICD-10-CM

## 2022-01-15 NOTE — Telephone Encounter (Signed)
Denied. Needs ov and to establish care with a new provider.

## 2022-01-15 NOTE — Telephone Encounter (Signed)
[facility] faxed refill request for the following medications:   amLODipine (NORVASC) 5 MG tablet   Please advise.  

## 2022-01-16 ENCOUNTER — Other Ambulatory Visit: Payer: Self-pay | Admitting: Neurology

## 2022-01-16 ENCOUNTER — Other Ambulatory Visit: Payer: Self-pay | Admitting: Gastroenterology

## 2022-01-17 DIAGNOSIS — M545 Low back pain, unspecified: Secondary | ICD-10-CM | POA: Diagnosis not present

## 2022-01-17 DIAGNOSIS — M79622 Pain in left upper arm: Secondary | ICD-10-CM | POA: Diagnosis not present

## 2022-01-17 DIAGNOSIS — M79661 Pain in right lower leg: Secondary | ICD-10-CM | POA: Diagnosis not present

## 2022-01-17 DIAGNOSIS — M25512 Pain in left shoulder: Secondary | ICD-10-CM | POA: Diagnosis not present

## 2022-01-17 DIAGNOSIS — M25511 Pain in right shoulder: Secondary | ICD-10-CM | POA: Diagnosis not present

## 2022-01-17 DIAGNOSIS — G890 Central pain syndrome: Secondary | ICD-10-CM | POA: Diagnosis not present

## 2022-01-17 DIAGNOSIS — M542 Cervicalgia: Secondary | ICD-10-CM | POA: Diagnosis not present

## 2022-01-17 DIAGNOSIS — M79662 Pain in left lower leg: Secondary | ICD-10-CM | POA: Diagnosis not present

## 2022-01-17 DIAGNOSIS — M79621 Pain in right upper arm: Secondary | ICD-10-CM | POA: Diagnosis not present

## 2022-01-17 DIAGNOSIS — G894 Chronic pain syndrome: Secondary | ICD-10-CM | POA: Diagnosis not present

## 2022-01-17 DIAGNOSIS — Z79891 Long term (current) use of opiate analgesic: Secondary | ICD-10-CM | POA: Diagnosis not present

## 2022-01-17 DIAGNOSIS — M5136 Other intervertebral disc degeneration, lumbar region: Secondary | ICD-10-CM | POA: Diagnosis not present

## 2022-01-18 ENCOUNTER — Other Ambulatory Visit: Payer: Self-pay | Admitting: Gastroenterology

## 2022-01-19 ENCOUNTER — Ambulatory Visit: Admission: RE | Admit: 2022-01-19 | Payer: Medicaid Other | Source: Ambulatory Visit

## 2022-01-20 ENCOUNTER — Other Ambulatory Visit: Payer: Self-pay | Admitting: Neurology

## 2022-01-23 NOTE — Telephone Encounter (Signed)
Rx refilled.

## 2022-01-26 ENCOUNTER — Ambulatory Visit: Payer: Medicaid Other

## 2022-01-30 ENCOUNTER — Other Ambulatory Visit: Payer: Self-pay | Admitting: Obstetrics and Gynecology

## 2022-01-30 NOTE — Patient Instructions (Signed)
Hi [name], Don't forget to call your PCP's office and schedule an appointment please.  [name] was given information about Medicaid Managed Care team care coordination services as a part of their [place] Complete Medicaid benefit. Sara Munoz verbally consented to engagement with the [facility] Managed Care team.   If you are experiencing a medical emergency, please call 911 or report to your local emergency department or urgent care.   If you have a non-emergency medical problem during routine business hours, please contact your provider's office and ask to speak with a nurse.   For questions related to your [place] Complete Medicaid health plan, please call: [phone]  If you would like to schedule transportation through your [place] Complete Medicaid plan, please call the following number at least 2 days in advance of your appointment: [phone].   There is no limit to the number of trips during the year between medical appointments, healthcare facilities, or pharmacies. Transportation must be scheduled at least 2 business days before but not more than thirty 30 days before of your appointment.  Call the Sara Munoz at [phone], at any time, 24 hours a day, 7 days a week. If you are in danger or need immediate medical attention call 911.  If you would like help to quit smoking, call 1-800-QUIT-NOW (680)131-5384) OR Espaol: 1-855-Djelo-Ya (5-956-387-5643) o para ms informacin haga clic aqu or Text READY to 200-400 to register via text  [name] - following are the goals we discussed in your visit today:   Timeframe:  Long-Range Goal Priority:  High Start Date:       12/20/20                      Expected End Date:    ongoing                   Follow Up Date: 03/11/22 - schedule appointment for flu shot - schedule appointment for vaccines needed due to my age or health - schedule recommended health tests (blood work, mammogram, colonoscopy,  pap test) - schedule and keep appointment for annual check-up   01/30/22:  Patient out of BP medicine-to call PCP today and schedule an appt to get refill Why is this important?   Screening tests can find diseases early when they are easier to treat.  Your doctor or nurse will talk with you about which tests are important for you.  Getting shots for common diseases like the flu and shingles will help prevent them.   Patient verbalizes understanding of instructions and care plan provided today and agrees to view in [place]. Active MyChart status and patient understanding of how to access instructions and care plan via MyChart confirmed with patient.     The Managed Medicaid care management team will reach out to the patient again over the next 30 business  days.  The  Patient                                              has been provided with contact information for the Managed Medicaid care management team and has been advised to call with any health related questions or concerns.   [name] RN, BSN [facility] Management Coordinator - Managed Medicaid High Risk [phone]   Following is a copy of  your plan of care:  Care Plan : General Plan of Care (Adult)  Updates made by [name], RN since 01/30/2022 12:00 AM     Problem: Health Promotion or Disease Self-Management (General Plan of Care)   Priority: High  Onset Date: 12/20/2020     Long-Range Goal: Self-Management Plan Developed   Start Date: 12/20/2020  Expected End Date: 03/26/2022  Recent Progress: Not on track  Priority: High  Note:    Current Barriers:  Knowledge Deficits related to short term plan for care coordination needs and long term plans for chronic disease management needs.  Patient with many chronic health conditions. 01/30/22:  patient is out of BP medication-to call and schedule an appt with PCP today.  Has not checked BP-smokes 1/2 ppd.  No change in neck and back pain-to  have procedure at surgeon's office 8/16 for neck pain.To see GI 8/31 for abd pain and constipation, NEURO in September.  Nurse Case Manager Clinical Goal(s):  patient will work with care management team to address care coordination and chronic disease management needs related to Disease Management Educational Needs Care Coordination Medication Management and Education Medication Reconciliation Medication Assistance  Psychosocial Support Mental Health Counseling   Interventions:  Evaluation of current treatment plan  and patient's adherence to plan as established by provider. Reviewed medications with patient. Collaborated with pharmacy regarding medications.Marland Kitchen  Collaborated with SW regarding anxiety. Discussed plans with patient for ongoing care management follow up and provided patient with direct contact information for care management team Advised patient, providing education and rationale, to monitor blood pressure daily and record, calling PCP for findings outside established parameters.  Reviewed scheduled/upcoming provider appointments. Social Work referral for anxiety, PTSD-completed Pharmacy referral for medication review-completed. Patient given 1-800-QUITNOW for smoking cessation if interested.  Self Care Activities:  Patient will self administer medications as prescribed Patient will attend all scheduled provider appointments Patient will call pharmacy for medication refills Patient will continue to perform ADL's independently Patient will call provider office for new concerns or questions Patient Goals: In the next 90 days, patient will attend all scheduled appointments. - Follow Up Plan: The patient has been provided with contact information for the care management team and has been advised to call with any health related questions or concerns.  The care management team will reach out to the patient again over the next 30 business  days.  Implement additional goals and  interventions based on identified risk factors.

## 2022-01-30 NOTE — Patient Outreach (Signed)
Medicaid Managed Care   Nurse Care Manager Note  01/30/2022 Name:  Sara Munoz MRN:  [id number] DOB:  1966/06/08  Sara Munoz is an 56 y.o. year old female who is a primary patient of Sara Post, [name] (Inactive).  The [facility] Managed Care Coordination team was consulted for assistance with:    Chronic healthcare management needs, HTN, tobacco use, migraines, CAD, COPD, chronic pain, OSA, osteoarthritis, constipation , GERD  [name] was given information about Medicaid Managed Care Coordination team services today. Sara Munoz Patient agreed to services and verbal consent obtained.  Engaged with patient by telephone for follow up visit in response to provider referral for case management and/or care coordination services.   Assessments/Interventions:  Review of past medical history, allergies, medications, health status, including review of consultants reports, laboratory and other test data, was performed as part of comprehensive evaluation and provision of chronic care management services.  SDOH (Social Determinants of Health) assessments and interventions performed: SDOH Interventions    Flowsheet Row Most Recent Value  SDOH Interventions   Food Insecurity Interventions Intervention Not Indicated      Care Plan  Allergies  Allergen Reactions   Flexeril [Cyclobenzaprine] Hives and Swelling    Facial/lip swelling      Levofloxacin Hives and Swelling   Phenergan [Promethazine Hcl] Other (See Comments)    Agitation.    Toradol [Ketorolac Tromethamine] Swelling and Other (See Comments)    Facial/tongue swelling    Tramadol Hives, Swelling and Other (See Comments)    Lip swelling    Zoloft [Sertraline Hcl] Swelling    Tongue swelling      Medications Reviewed Today     Reviewed by Sara Medicus, RN (Registered Nurse) on 01/30/22 at 1243  Med List Status: <None>   Medication Order Taking? Sig Documenting Provider Last Dose Status Informant  albuterol  (PROVENTIL) (2.5 MG/3ML) 0.083% nebulizer solution [id number]  Take 3 mLs (2.5 mg total) by nebulization every 6 (six) hours as needed for wheezing or shortness of breath. Sara Pita, MD  Active Self  albuterol (VENTOLIN HFA) 108 (90 Base) MCG/ACT inhaler [id number]  INHALE TWO PUFFS BY MOUTH INTO LUNGS EVERY 6 HOURS AS NEEDED FOR SHORTNESS OF BREATH OR wheezing Sara Munoz., MD  Active   amLODipine (NORVASC) 5 MG tablet [id number]  TAKE ONE TABLET BY MOUTH ONCE DAILY Sara Munoz., MD  Active   atorvastatin (LIPITOR) 20 MG tablet [id number]  TAKE ONE TABLET BY MOUTH EVERY EVENING Sara Munoz., MD  Active   docusate sodium (COLACE) 100 MG capsule [id number]  Take 100 mg by mouth 2 (two) times daily. [provider]  Active Self  doxycycline (VIBRA-TABS) 100 MG tablet [id number]  Take 1 tablet by mouth in the morning and at bedtime. [provider]  Active   DULoxetine (CYMBALTA) 30 MG capsule [id number]  Take 30 mg by mouth 2 (two) times daily. [provider]  Active Self  Erenumab-aooe (AIMOVIG) 70 MG/ML SOAJ [id number]  Inject 70 mg into the skin every 30 (thirty) days. Sara Pacas, MD  Active   gabapentin (NEURONTIN) 600 MG tablet [id number]  Take 600 mg by mouth 4 (four) times daily. [provider]  Active Self  hydrOXYzine (ATARAX) 50 MG tablet [id number]  Take 50 mg by mouth 3 (three) times daily. [provider]  Active   lactulose (CHRONULAC) 10 GM/15ML solution [id number]  take 30 mls BY MOUTH FOUR TIMES DAILY Sara Munoz,  Sara Mech, MD  Active   LINZESS 290 MCG CAPS capsule [id number]  Take 290 mcg by mouth every morning. [provider]  Active   lisinopril (ZESTRIL) 40 MG tablet [id number]  TAKE ONE TABLET BY MOUTH EVERY EVENING Sara Munoz., MD  Active   metoprolol tartrate (LOPRESSOR) 25 MG tablet [id number] No TAKE 1 TABLET BY MOUTH TWICE A DAY  Patient not taking: Reported on 01/30/2022   Sara Munoz.,  MD Not Taking Active   mirtazapine (REMERON) 7.5 MG tablet [id number]  Take 7.5 mg by mouth at bedtime. [provider]  Active   naloxone [facility]) 4 MG/0.1ML LIQD nasal spray kit [id number]  Place 1 spray into the nose as needed (opioid overdose). [provider]  Active Self           Med Note Sara Munoz May 03, 2021 11:23 AM)    Naloxone HCl [facility]) [id number]  Inject 1 Dose as directed as needed (opioid overdose). [provider]  Active Self           Med Note Sara Munoz May 03, 2021 11:32 AM)    naproxen (NAPROSYN) 500 MG tablet [id number]  Take 500 mg by mouth 2 (two) times daily. [provider]  Active   omeprazole (PRILOSEC) 40 MG capsule [id number]  TAKE ONE CAPSULE BY MOUTH EVERY MORNING and TAKE ONE CAPSULE BY MOUTH EVERY Sara Anger, MD  Active   oxyCODONE (ROXICODONE) 15 MG immediate release tablet [id number]  Take 15 mg by mouth. [provider]  Active   SPIRIVA RESPIMAT 2.5 MCG/ACT AERS [id number]  INHALE 1 PUFF BY MOUTH INTO LUNGS ONCE DAILY Sara Munoz, Sara Bucy, MD  Active Self  sucralfate (CARAFATE) 1 GM/10ML suspension [id number]  TAKE 10 MLS (1 G TOTAL) BY MOUTH 4 (FOUR) TIMES DAILY. Sara Bellows, MD  Active   SUMAtriptan (IMITREX) 25 MG tablet [id number]  Take 1 tablet at onset of headache. May repeat 1 dose in 2 hours if needed. Max 2 tabs in 24 hours. Sara Pacas, MD  Active   SYMBICORT 160-4.5 MCG/ACT inhaler [id number]  INHALE TWO PUFFS BY MOUTH INTO LUNGS IN THE MORNING and AT BEDTIME Sara Munoz., MD  Active   tiZANidine (ZANAFLEX) 4 MG tablet [id number]  Take 1 tablet (4 mg total) by mouth every 6 (six) hours as needed for muscle spasms. Sara Pacas, MD  Active            Patient Active Problem List   Diagnosis Date Noted   Chronic migraine w/o aura w/o status migrainosus, not intractable 07/09/2021   Obstructive sleep apnea 07/09/2021   Body mass index (BMI) 32.0-32.9, adult  06/07/2021   Pseudoarthrosis of cervical spine ([place]) 05/14/2021   Lumbar radiculopathy 05/23/2020   Sacroiliac inflammation ([place]) 11/25/2019   Sepsis ([place]) 10/19/2018   Severe sepsis ([place]) 10/19/2018   Cervical radiculopathy 02/12/2018   Chronic migraine 02/12/2018   Paresthesia 08/29/2017   Neck pain 08/29/2017   Daily headache 08/29/2017   Chronic active hepatitis ([place]) 07/29/2017   Neurogenic pain 07/31/2016   Chronic pain syndrome 06/05/2016   Carotid artery stenosis 02/27/2016   Chronic constipation 02/27/2016   Chronic nausea 02/27/2016   Hypercholesterolemia 02/27/2016   Osteoarthritis 02/27/2016   PTSD (Munoz-traumatic stress disorder) 02/27/2016   TIA (transient ischemic attack) 02/27/2016   Long term current use of opiate analgesic 02/27/2016   Long term prescription opiate use 02/27/2016  Encounter for pain management consult 02/27/2016   Chronic hip pain (Location of Tertiary source of pain) (Bilateral) (L>R) 02/27/2016   Chronic knee pain (Bilateral) (L>R) 02/27/2016   Chronic shoulder pain (Bilateral) (L>R) 02/27/2016   Chronic sacroiliac joint pain (Bilateral) (L>R) 02/27/2016   Chronic low back pain (Location of Primary Source of Pain) (Bilateral) (L>R) 02/27/2016   Chronic lower extremity pain (Location of Secondary source of pain) (Bilateral) (L>R) 02/27/2016   Osteoarthritis of hip (Bilateral) (L>R) 02/27/2016   Chronic neck pain (posterior midline) (Bilateral) (L>R) 02/27/2016   Cervicogenic headache (Bilateral) (L>R) 02/27/2016   Occipital headache (Bilateral) (L>R) 02/27/2016   Chronic upper extremity pain (Bilateral) (L>R) 02/27/2016   Chronic Cervical radicular pain (Bilateral) (L>R) 02/27/2016   Chronic lumbar radicular pain (Right) (L5 dermatome) 02/27/2016   Lumbar facet syndrome (Bilateral) (L>R) 02/27/2016   Long term prescription benzodiazepine use 02/27/2016   Hypertension    Chronic obstructive pulmonary disease ([place]) 10/09/2015   GERD  (gastroesophageal reflux disease) 10/09/2015   Non-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/2014 08/02/2015   Hyperlipidemia 08/02/2015   Costochondritis 08/02/2015   Drug abuse, 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[place]) 09/03/2014   GAD (generalized anxiety disorder) 09/03/2014   Narcotic dependence ([place]) 09/03/2014   PVD (peripheral vascular disease) ([place]) 09/03/2014   Smoker 09/03/2014   Cigarette nicotine dependence with nicotine-induced disorder 07/08/2014   Anemia of chronic disease 03/19/2014   Narcotic abuse, continuous ([place]) 03/19/2014   Polysubstance abuse ([place]) 03/19/2014   MSSA (methicillin susceptible Staphylococcus aureus) septicemia ([place]) 03/07/2014   Hypomagnesemia 01/04/2014   Chronic cough 09/04/2011   Sleep apnea, obstructive 09/04/2011   Sinusitis, acute 09/04/2011   Conditions to be addressed/monitored per PCP order:  Chronic healthcare management needs, HTN, tobacco use, migraines, CAD, COPD, chronic pain, OSA, osteoarthritis, constipation, PVD, HLD, anxiety  Care Plan : General Plan of Care (Adult)  Updates made by Sara Medicus, RN since 01/30/2022 12:00 AM     Problem: Health Promotion or Disease Self-Management (General Plan of Care)   Priority: High  Onset Date: 12/20/2020     Long-Range Goal: Self-Management Plan Developed   Start Date: 12/20/2020  Expected End Date: 03/26/2022  Recent Progress: Not on track  Priority: High  Note:    Current Barriers:  Knowledge Deficits related to short term plan for care coordination needs and long term plans for chronic disease management needs.  Patient with many chronic health conditions. 01/30/22:  patient is out of BP medication-to call and schedule an appt with PCP today.  Has not checked BP-smokes 1/2 ppd.  No change in neck and back pain-to have procedure at surgeon's office 8/16 for neck pain.To see GI 8/31 for abd pain and constipation, NEURO in September.  Nurse Case Manager Clinical Goal(s):  patient will work with care management team to address  care coordination and chronic disease management needs related to Disease Management Educational Needs Care Coordination Medication Management and Education Medication Reconciliation Medication Assistance  Psychosocial Support Mental Health Counseling   Interventions:  Evaluation of current treatment plan  and patient's adherence to plan as established by provider. Reviewed medications with patient. Collaborated with pharmacy regarding medications.Marland Kitchen  Collaborated with SW regarding anxiety. Discussed plans with patient for ongoing care management follow up and provided patient with direct contact information for care management team Advised patient, providing education and rationale, to monitor blood pressure daily and record, calling PCP for findings outside established parameters.  Reviewed scheduled/upcoming provider appointments. Social Work referral for anxiety, PTSD-completed Pharmacy referral for medication  review-completed. Patient given 1-800-QUITNOW for smoking cessation if interested.  Self Care Activities:  Patient will self administer medications as prescribed Patient will attend all scheduled provider appointments Patient will call pharmacy for medication refills Patient will continue to perform ADL's independently Patient will call provider office for new concerns or questions Patient Goals: In the next 90 days, patient will attend all scheduled appointments. - Follow Up Plan: The patient has been provided with contact information for the care management team and has been advised to call with any health related questions or concerns.  The care management team will reach out to the patient again over the next 30 business  days.  Implement additional goals and interventions based on identified risk factors.    Follow Up:  Patient agrees to Care Plan and Follow-up.  Plan: The Managed Medicaid care management team will reach out to the patient again over the next 30  business  days. and The  Patient has been provided with contact information for the Managed Medicaid care management team and has been advised to call with any health related questions or concerns.  Date/time of next scheduled RN care management/care coordination outreach:  03/11/22 at 230.

## 2022-02-06 DIAGNOSIS — M47812 Spondylosis without myelopathy or radiculopathy, cervical region: Secondary | ICD-10-CM | POA: Diagnosis not present

## 2022-02-09 DIAGNOSIS — M25572 Pain in left ankle and joints of left foot: Secondary | ICD-10-CM | POA: Diagnosis not present

## 2022-02-09 DIAGNOSIS — S8253XA Displaced fracture of medial malleolus of unspecified tibia, initial encounter for closed fracture: Secondary | ICD-10-CM | POA: Diagnosis not present

## 2022-02-09 DIAGNOSIS — S82892A Other fracture of left lower leg, initial encounter for closed fracture: Secondary | ICD-10-CM | POA: Diagnosis not present

## 2022-02-09 DIAGNOSIS — F17210 Nicotine dependence, cigarettes, uncomplicated: Secondary | ICD-10-CM | POA: Diagnosis not present

## 2022-02-12 ENCOUNTER — Emergency Department: Payer: Medicaid Other

## 2022-02-12 ENCOUNTER — Encounter: Payer: Self-pay | Admitting: Emergency Medicine

## 2022-02-12 ENCOUNTER — Emergency Department
Admission: EM | Admit: 2022-02-12 | Discharge: 2022-02-12 | Disposition: A | Discharge status: Home / Self Care | Payer: Medicaid Other | Attending: Student in an Organized Health Care Education/Training Program | Admitting: Student in an Organized Health Care Education/Training Program

## 2022-02-12 ENCOUNTER — Other Ambulatory Visit: Payer: Self-pay

## 2022-02-12 DIAGNOSIS — M79605 Pain in left leg: Secondary | ICD-10-CM | POA: Diagnosis not present

## 2022-02-12 DIAGNOSIS — Y92832 Beach as the place of occurrence of the external cause: Secondary | ICD-10-CM | POA: Diagnosis not present

## 2022-02-12 DIAGNOSIS — S8255XA Nondisplaced fracture of medial malleolus of left tibia, initial encounter for closed fracture: Secondary | ICD-10-CM | POA: Diagnosis not present

## 2022-02-12 DIAGNOSIS — M47816 Spondylosis without myelopathy or radiculopathy, lumbar region: Secondary | ICD-10-CM | POA: Insufficient documentation

## 2022-02-12 DIAGNOSIS — S82392A Other fracture of lower end of left tibia, initial encounter for closed fracture: Secondary | ICD-10-CM

## 2022-02-12 DIAGNOSIS — M7989 Other specified soft tissue disorders: Secondary | ICD-10-CM | POA: Diagnosis not present

## 2022-02-12 DIAGNOSIS — S8252XA Displaced fracture of medial malleolus of left tibia, initial encounter for closed fracture: Secondary | ICD-10-CM | POA: Diagnosis not present

## 2022-02-12 DIAGNOSIS — W1839XA Other fall on same level, initial encounter: Secondary | ICD-10-CM | POA: Insufficient documentation

## 2022-02-12 DIAGNOSIS — S99912A Unspecified injury of left ankle, initial encounter: Secondary | ICD-10-CM | POA: Diagnosis present

## 2022-02-12 DIAGNOSIS — M25572 Pain in left ankle and joints of left foot: Secondary | ICD-10-CM | POA: Diagnosis not present

## 2022-02-12 MED ORDER — OXYCODONE HCL 5 MG PO TABS
15.0000 mg | ORAL_TABLET | Freq: Once | ORAL | Status: AC
  Administered 2022-02-12: 15 mg via ORAL
  Filled 2022-02-12: qty 3

## 2022-02-12 NOTE — TOC Transition Note (Signed)
Transition of Care [facility]) - CM/SW Discharge Note   Patient Details  Name: MERCADIES CO MRN: [id number] Date of Birth: February 12, 1966  [facility]) CM/SW Contact:  Shelbie Hutching, RN Phone Number: 02/12/2022, 10:37 AM   Clinical Narrative:    Patient being discharged and on her way out the door.  [employer] will deliver the wheelchair to the patient's home.     Final next level of care: Home/Self Care     Patient Goals and CMS Choice        Discharge Placement                       Discharge Plan and Services                DME Arranged: Lightweight manual wheelchair with seat cushion DME Agency: [employer] Date DME Agency Contacted: 02/12/22 Time DME Agency Contacted: [phone] Representative spoke with at DME Agency: Suanne Marker HH Arranged: NA          Social Determinants of Health (Christmas) Interventions     Readmission Risk Interventions     No data to display

## 2022-02-12 NOTE — ED Provider Notes (Signed)
[facility] Provider Note    Event Date/Time   First MD Initiated Contact with Patient 02/12/22 [phone]     (approximate)   History   Fall   HPI  Sara Munoz is a 56 y.o. female presents to the ER for evaluation of left ankle and leg pain status post posterior malleoli are nondisplaced fracture that occurred while she was at [employer] past weekend.  Has been in a walking cast has not been doing well with the crutches states that she did better with a walker.  She is currently out of her pain medications states that she takes 15 mg oxycodone follows up with pain clinic next on Thursday.  Denies any numbness or tingling no other associated injury.     Physical Exam   Triage Vital Signs: ED Triage Vitals  Enc Vitals Group     BP 02/12/22 0754 (!) 158/85     Pulse Rate 02/12/22 0754 61     Resp 02/12/22 0754 18     Temp 02/12/22 0754 97.8 F (36.6 C)     Temp Source 02/12/22 0754 Oral     SpO2 02/12/22 0754 97 %     Weight 02/12/22 0752 192 lb 0.3 oz (87.1 kg)     Height 02/12/22 0752 '5\' 4"'$  (1.626 m)     Head Circumference --      Peak Flow --      Pain Score 02/12/22 0752 10     Pain Loc --      Pain Edu? --      Excl. in [place]? --     Most recent vital signs: Vitals:   02/12/22 0754  BP: (!) 158/85  Pulse: 61  Resp: 18  Temp: 97.8 F (36.6 C)  SpO2: 97%     Constitutional: Alert  Eyes: Conjunctivae are normal.  Head: Atraumatic. Nose: No congestion/rhinnorhea. Mouth/Throat: Mucous membranes are moist.   Neck: Painless ROM.  Cardiovascular:   Good peripheral circulation. Respiratory: Normal respiratory effort.  No retractions.  Gastrointestinal: Soft and nontender.  Musculoskeletal: Swelling of the left ankle neurovascular intact distally.  Compartments are soft. Neurologic:  MAE spontaneously. No gross focal neurologic deficits are appreciated.  Skin:  Skin is warm, dry and intact. No rash noted. Psychiatric: Mood and affect are  normal. Speech and behavior are normal.    ED Results / Procedures / Treatments   Labs (all labs ordered are listed, but only abnormal results are displayed) Labs Reviewed - No data to display   EKG     RADIOLOGY Please see ED Course for my review and interpretation.  I personally reviewed all radiographic images ordered to evaluate for the above acute complaints and reviewed radiology reports and findings.  These findings were personally discussed with the patient.  Please see medical record for radiology report.    PROCEDURES:  Critical Care performed:   Procedures   MEDICATIONS ORDERED IN ED: Medications  oxyCODONE (Oxy IR/ROXICODONE) immediate release tablet 15 mg (15 mg Oral Given 02/12/22 0907)     IMPRESSION / MDM / ASSESSMENT AND PLAN / ED COURSE  I reviewed the triage vital signs and the nursing notes.                              Differential diagnosis includes, but is not limited to, fracture, contusion, dislocation, compartment syndrome  Presented to the ER for evaluation of symptoms as described  above.  X-rays repeated.  Her exam as above.    Clinical Course as of 02/12/22 1028  Tue Feb 12, 2022  0915 X-ray on my review and interpretation does show evidence of posterior malleolar fracture nondisplaced.  No proximal injury. [PR]  1021 CT imaging reviewed and discussed with Dr. Rudene Christians of orthopedics who has recommended patient remain not weightbearing as the patient may need operative management.  She is struggling with crutches and was requested walker but this will limit her nonweightbearing ability therefore do believe she is can require wheelchair. [PR]    Clinical Course User Index [PR] Merlyn Lot, MD   Patient suffers from Tibial fracture with unresolved pain which impairs their ability to perform daily activities like bathing, dressing, feeding, and grooming in the home.  A crutch will not resolve issue with performing activities of daily  living. A wheelchair will allow patient to safely perform daily activities. Patient is not able to propel themselves in the home using a standard weight wheelchair due to endurance. Patient can self propel in the lightweight wheelchair. Length of need 6 months . Accessories: elevating leg rests (ELRs), wheel locks, extensions and anti-tippers.  Seat cushion and back cushion     FINAL CLINICAL IMPRESSION(S) / ED DIAGNOSES   Final diagnoses:  Closed fracture of posterior malleolus of left tibia, initial encounter     Rx / DC Orders   ED Discharge Orders     None        Note:  This document was prepared using Dragon voice recognition software and may include unintentional dictation errors.    Merlyn Lot, MD 02/12/22 1028

## 2022-02-12 NOTE — Discharge Instructions (Signed)
Take a daily ASA to reduce risk of blood clots while in splint.  Follow up with Dr. Rudene Christians clinic on Friday.

## 2022-02-12 NOTE — TOC Initial Note (Signed)
Transition of Care [facility]) - Initial/Assessment Note    Patient Details  Name: Sara Munoz MRN: [id number] Date of Birth: 14-Sep-1965  Transition of Care [facility]) CM/SW Contact:    Shelbie Hutching, RN Phone Number: 02/12/2022, 10:32 AM  Clinical Narrative:                 Patient being seen in the emergency room after a fall and with leg pain.  Patient suffered a [place]ia fracture [place].  She was given crutches which she is having trouble walking with.  MD asked to order a wheelchair.  Wheelchair ordered from [facility] and will be delivered to the patient's room before she leaves the hospital.   Expected Discharge Plan: Home/Self Care     Patient Goals and CMS Choice        Expected Discharge Plan and Services Expected Discharge Plan: Home/Self Care                         DME Arranged: Lightweight manual wheelchair with seat cushion DME Agency: [employer] Date DME Agency Contacted: 02/12/22 Time DME Agency Contacted: [phone] Representative spoke with at DME Agency: [place]: NA          Prior Living Arrangements/Services                  Current home services: DME (crutches)    Activities of Daily Living      Permission Sought/Granted                  Emotional Assessment              Admission diagnosis:  Fall, L leg injury Patient Active Problem List   Diagnosis Date Noted   Chronic migraine w/o aura w/o status migrainosus, not intractable 07/09/2021   Obstructive sleep apnea 07/09/2021   Body mass index (BMI) 32.0-32.9, adult 06/07/2021   Pseudoarthrosis of cervical spine ([place]) 05/14/2021   Lumbar radiculopathy 05/23/2020   Sacroiliac inflammation ([place]) 11/25/2019   Sepsis ([place]) 10/19/2018   Severe sepsis ([employer] [facility]) 10/19/2018   Cervical radiculopathy 02/12/2018   Chronic migraine 02/12/2018   Paresthesia 08/29/2017   Neck pain 08/29/2017   Daily headache 08/29/2017   Chronic active hepatitis ([place]) 07/29/2017    Neurogenic pain 07/31/2016   Chronic pain syndrome 06/05/2016   Carotid artery stenosis 02/27/2016   Chronic constipation 02/27/2016   Chronic nausea 02/27/2016   Hypercholesterolemia 02/27/2016   Osteoarthritis 02/27/2016   PTSD (post-traumatic stress disorder) 02/27/2016   TIA (transient ischemic attack) 02/27/2016   Long term current use of opiate analgesic 02/27/2016   Long term prescription opiate use 02/27/2016   Encounter for pain management consult 02/27/2016   Chronic hip pain (Location of Tertiary source of pain) (Bilateral) (L>R) 02/27/2016   Chronic knee pain (Bilateral) (L>R) 02/27/2016   Chronic shoulder pain (Bilateral) (L>R) 02/27/2016   Chronic sacroiliac joint pain (Bilateral) (L>R) 02/27/2016   Chronic low back pain (Location of Primary Source of Pain) (Bilateral) (L>R) 02/27/2016   Chronic lower extremity pain (Location of Secondary source of pain) (Bilateral) (L>R) 02/27/2016   Osteoarthritis of hip (Bilateral) (L>R) 02/27/2016   Chronic neck pain (posterior midline) (Bilateral) (L>R) 02/27/2016   Cervicogenic headache (Bilateral) (L>R) 02/27/2016   Occipital headache (Bilateral) (L>R) 02/27/2016   Chronic upper extremity pain (Bilateral) (L>R) 02/27/2016   Chronic Cervical radicular pain (Bilateral) (L>R) 02/27/2016   Chronic lumbar radicular pain (Right) (L5 dermatome)  02/27/2016   Lumbar facet syndrome (Bilateral) (L>R) 02/27/2016   Long term prescription benzodiazepine use 02/27/2016   Hypertension    Chronic obstructive pulmonary disease ([place]) 10/09/2015   GERD (gastroesophageal reflux disease) 10/09/2015   Non-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/2014 08/02/2015   Hyperlipidemia 08/02/2015   Costochondritis 08/02/2015   Drug abuse, 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[place]) 09/03/2014   GAD (generalized anxiety disorder) 09/03/2014   Narcotic dependence ([place]) 09/03/2014   PVD (peripheral vascular disease) ([place]) 09/03/2014   Smoker 09/03/2014   Cigarette nicotine dependence with  nicotine-induced disorder 07/08/2014   Anemia of chronic disease 03/19/2014   Narcotic abuse, continuous ([place]) 03/19/2014   Polysubstance abuse ([place]) 03/19/2014   MSSA (methicillin susceptible Staphylococcus aureus) septicemia ([place]) 03/07/2014   Hypomagnesemia 01/04/2014   Chronic cough 09/04/2011   Sleep apnea, obstructive 09/04/2011   Sinusitis, acute 09/04/2011   PCP:  Paulene Floor (Inactive) Pharmacy:   [facility] - [place], [place] - [address] [place] [address] Phone: [phone] Fax: [phone]  [id number] [place] [address] [place]: [phone]: [phone]    Social Determinants of Health (SDOH) Interventions    Readmission Risk Interventions     No data to display

## 2022-02-12 NOTE — ED Triage Notes (Signed)
Pt here with a fall and leg pain. Pt states she fell at the beach over the weekend and has a tibia fracture and has been having continued pain. Pt's leg is is a cast at the moment, pt states she cannot use the crutches she was given from the out of town facility.

## 2022-02-13 DIAGNOSIS — S82209A Unspecified fracture of shaft of unspecified tibia, initial encounter for closed fracture: Secondary | ICD-10-CM | POA: Diagnosis not present

## 2022-02-13 DIAGNOSIS — J449 Chronic obstructive pulmonary disease, unspecified: Secondary | ICD-10-CM | POA: Diagnosis not present

## 2022-02-14 DIAGNOSIS — M25512 Pain in left shoulder: Secondary | ICD-10-CM | POA: Diagnosis not present

## 2022-02-14 DIAGNOSIS — M542 Cervicalgia: Secondary | ICD-10-CM | POA: Diagnosis not present

## 2022-02-14 DIAGNOSIS — M545 Low back pain, unspecified: Secondary | ICD-10-CM | POA: Diagnosis not present

## 2022-02-14 DIAGNOSIS — G890 Central pain syndrome: Secondary | ICD-10-CM | POA: Diagnosis not present

## 2022-02-14 DIAGNOSIS — M79662 Pain in left lower leg: Secondary | ICD-10-CM | POA: Diagnosis not present

## 2022-02-14 DIAGNOSIS — G894 Chronic pain syndrome: Secondary | ICD-10-CM | POA: Diagnosis not present

## 2022-02-14 DIAGNOSIS — M5136 Other intervertebral disc degeneration, lumbar region: Secondary | ICD-10-CM | POA: Diagnosis not present

## 2022-02-14 DIAGNOSIS — M79622 Pain in left upper arm: Secondary | ICD-10-CM | POA: Diagnosis not present

## 2022-02-14 DIAGNOSIS — M79621 Pain in right upper arm: Secondary | ICD-10-CM | POA: Diagnosis not present

## 2022-02-14 DIAGNOSIS — M25511 Pain in right shoulder: Secondary | ICD-10-CM | POA: Diagnosis not present

## 2022-02-14 DIAGNOSIS — M79661 Pain in right lower leg: Secondary | ICD-10-CM | POA: Diagnosis not present

## 2022-02-15 DIAGNOSIS — S82892A Other fracture of left lower leg, initial encounter for closed fracture: Secondary | ICD-10-CM | POA: Diagnosis not present

## 2022-02-16 ENCOUNTER — Other Ambulatory Visit: Payer: Self-pay | Admitting: Gastroenterology

## 2022-02-16 ENCOUNTER — Other Ambulatory Visit: Payer: Self-pay | Admitting: Family Medicine

## 2022-02-16 DIAGNOSIS — I10 Essential (primary) hypertension: Secondary | ICD-10-CM

## 2022-02-18 NOTE — Progress Notes (Deleted)
Established patient visit   Patient: Sara Munoz   DOB: 04-11-1966   56 y.o. Female  MRN: [id number] Visit Date: 02/19/2022  Today's healthcare provider: Wilhemena Durie, MD   No chief complaint on file.  Subjective    HPI  Patient is here for medication refills.  Medications: Outpatient Medications Prior to Visit  Medication Sig   albuterol (PROVENTIL) (2.5 MG/3ML) 0.083% nebulizer solution Take 3 mLs (2.5 mg total) by nebulization every 6 (six) hours as needed for wheezing or shortness of breath.   albuterol (VENTOLIN HFA) 108 (90 Base) MCG/ACT inhaler INHALE TWO PUFFS BY MOUTH INTO LUNGS EVERY 6 HOURS AS NEEDED FOR SHORTNESS OF BREATH OR wheezing   amLODipine (NORVASC) 5 MG tablet TAKE ONE TABLET BY MOUTH ONCE DAILY   atorvastatin (LIPITOR) 20 MG tablet TAKE ONE TABLET BY MOUTH EVERY EVENING   docusate sodium (COLACE) 100 MG capsule Take 100 mg by mouth 2 (two) times daily.   doxycycline (VIBRA-TABS) 100 MG tablet Take 1 tablet by mouth in the morning and at bedtime.   DULoxetine (CYMBALTA) 30 MG capsule Take 30 mg by mouth 2 (two) times daily.   Erenumab-aooe (AIMOVIG) 70 MG/ML SOAJ Inject 70 mg into the skin every 30 (thirty) days.   gabapentin (NEURONTIN) 600 MG tablet Take 600 mg by mouth 4 (four) times daily.   hydrOXYzine (ATARAX) 50 MG tablet Take 50 mg by mouth 3 (three) times daily.   lactulose (CHRONULAC) 10 GM/15ML solution take 30 mls BY MOUTH FOUR TIMES DAILY   LINZESS 290 MCG CAPS capsule Take 290 mcg by mouth every morning.   lisinopril (ZESTRIL) 40 MG tablet TAKE ONE TABLET BY MOUTH EVERY EVENING   metoprolol tartrate (LOPRESSOR) 25 MG tablet TAKE 1 TABLET BY MOUTH TWICE A DAY (Patient not taking: Reported on 01/30/2022)   mirtazapine (REMERON) 7.5 MG tablet Take 7.5 mg by mouth at bedtime.   naloxone (NARCAN) 4 MG/0.1ML LIQD nasal spray kit Place 1 spray into the nose as needed (opioid overdose).   Naloxone HCl (NARCAN IJ) Inject 1 Dose as directed as  needed (opioid overdose).   naproxen (NAPROSYN) 500 MG tablet Take 500 mg by mouth 2 (two) times daily.   omeprazole (PRILOSEC) 40 MG capsule TAKE 1 CAPSULE BY MOUTH TWICE A DAY   oxyCODONE (ROXICODONE) 15 MG immediate release tablet Take 15 mg by mouth.   SPIRIVA RESPIMAT 2.5 MCG/ACT AERS INHALE 1 PUFF BY MOUTH INTO LUNGS ONCE DAILY   sucralfate (CARAFATE) 1 GM/10ML suspension TAKE 10 MLS (1 G TOTAL) BY MOUTH 4 (FOUR) TIMES DAILY.   SUMAtriptan (IMITREX) 25 MG tablet Take 1 tablet at onset of headache. May repeat 1 dose in 2 hours if needed. Max 2 tabs in 24 hours.   SYMBICORT 160-4.5 MCG/ACT inhaler INHALE TWO PUFFS BY MOUTH INTO LUNGS IN THE MORNING and AT BEDTIME   tiZANidine (ZANAFLEX) 4 MG tablet Take 1 tablet (4 mg total) by mouth every 6 (six) hours as needed for muscle spasms.   No facility-administered medications prior to visit.    Review of Systems  Constitutional:  Negative for appetite change, chills, fatigue and fever.  Respiratory:  Negative for chest tightness and shortness of breath.   Cardiovascular:  Negative for chest pain and palpitations.  Gastrointestinal:  Negative for abdominal pain, nausea and vomiting.  Neurological:  Negative for dizziness and weakness.    {Labs  Heme  Chem  Endocrine  Serology  Results Review (optional):23779}  Objective    LMP 12/16/2017 (Approximate)  {Show previous vital signs (optional):23777}  Physical Exam  ***  No results found for any visits on 02/19/22.  Assessment & Plan     ***  No follow-ups on file.      {provider attestation***:1}   Richard Gilbert Jr, MD  [facility] [phone] (phone) [phone] (fax)  [facility] 

## 2022-02-19 ENCOUNTER — Ambulatory Visit: Payer: Medicaid Other | Admitting: Family Medicine

## 2022-02-21 ENCOUNTER — Other Ambulatory Visit: Payer: Self-pay

## 2022-02-21 ENCOUNTER — Ambulatory Visit (INDEPENDENT_AMBULATORY_CARE_PROVIDER_SITE_OTHER): Payer: Medicaid Other | Admitting: Gastroenterology

## 2022-02-21 ENCOUNTER — Encounter: Payer: Self-pay | Admitting: Gastroenterology

## 2022-02-21 VITALS — BP 112/68 | HR 85 | Temp 98.1°F

## 2022-02-21 DIAGNOSIS — R109 Unspecified abdominal pain: Secondary | ICD-10-CM | POA: Diagnosis not present

## 2022-02-21 DIAGNOSIS — Z86010 Personal history of colonic polyps: Secondary | ICD-10-CM

## 2022-02-21 DIAGNOSIS — K5900 Constipation, unspecified: Secondary | ICD-10-CM | POA: Diagnosis not present

## 2022-02-21 DIAGNOSIS — Z791 Long term (current) use of non-steroidal anti-inflammatories (NSAID): Secondary | ICD-10-CM | POA: Diagnosis not present

## 2022-02-21 MED ORDER — ONDANSETRON HCL 4 MG PO TABS: 4.0000 mg | ORAL_TABLET | Freq: Three times a day (TID) | ORAL | 0 refills | Status: DC | PRN

## 2022-02-21 MED ORDER — NA SULFATE-K SULFATE-MG SULF 17.5-3.13-1.6 GM/177ML PO SOLN: 354.0000 mL | Freq: Once | ORAL | 0 refills | Status: AC

## 2022-02-21 NOTE — Progress Notes (Signed)
[name] MD, MRCP(U.K) [address]  [place]  [place], [place] [address]  Main: [phone]  Fax: [phone]   Primary Care Physician: Paulene Floor (Inactive)  Primary Gastroenterologist:  Dr. [name]   Chief Complaint  Patient presents with   Nausea   Constipation    HPI: Sara Munoz is a 56 y.o. female  Summary of history : Sara Munoz was initially referred back in 05/2016 for hepatitis C GT 3  Completed treatment in 08/2017 and attained SVR. constipation, hepatic steatosis, gastritis.  Upper GI bleed secondary to [facility] powder use.     CT abdomen 03/17/17- increased stool burden and hepatic steatosis.   Colonoscopy 08/2016 - small adenomas excised  RUQ USG 05/07/16 showed hepatic steatosis.  09/18/17 : EGD: normal appearance but bx showed mild active gastritis and negative for H pylori .  02/25/18: H pylori breath test negative   10/19/2018: CT scan of the abdomenPerinephric fat stranding was seen 07/27/2019: Seen at the office complaint of melena was taking BC powders for headaches for years.  08/10/2019 EGD: Localized inflammation seen in the antrum suggestive of gastritis biopsies taken that demonstrated reactive gastropathy.  Negative for H. pylori. 07/11/2021: She had resume taking BC powders was having abdominal pain similar to the past, was having constipation, was smoking was scheduled for an endoscopy but canceled the procedure   Interval history 07/11/2021-02/21/2022  She could not proceed with EGD the last time as she did not have anyone to take her home.  She still takes BC powders few times a month.  Takes her Protonix twice a day.  Continues to have issues with constipation she takes her Trulance and lactulose but was unable to tell me how many times a day she takes the lactulose continues to smoke.  Has same chronic epigastric discomfort that she has had in the past    Current Outpatient Medications  Medication Sig Dispense Refill    albuterol (PROVENTIL) (2.5 MG/3ML) 0.083% nebulizer solution Take 3 mLs (2.5 mg total) by nebulization every 6 (six) hours as needed for wheezing or shortness of breath. 75 mL 12   albuterol (VENTOLIN HFA) 108 (90 Base) MCG/ACT inhaler INHALE TWO PUFFS BY MOUTH INTO LUNGS EVERY 6 HOURS AS NEEDED FOR SHORTNESS OF BREATH OR wheezing 18 g 0   amLODipine (NORVASC) 5 MG tablet TAKE ONE TABLET BY MOUTH ONCE DAILY 90 tablet 0   atorvastatin (LIPITOR) 20 MG tablet TAKE ONE TABLET BY MOUTH EVERY EVENING 30 tablet 0   docusate sodium (COLACE) 100 MG capsule Take 100 mg by mouth 2 (two) times daily.     doxycycline (VIBRA-TABS) 100 MG tablet Take 1 tablet by mouth in the morning and at bedtime.     DULoxetine (CYMBALTA) 30 MG capsule Take 30 mg by mouth 2 (two) times daily.     Erenumab-aooe (AIMOVIG) 70 MG/ML SOAJ Inject 70 mg into the skin every 30 (thirty) days. 1.12 mL 11   gabapentin (NEURONTIN) 600 MG tablet Take 600 mg by mouth 4 (four) times daily.     hydrOXYzine (ATARAX) 50 MG tablet Take 50 mg by mouth 3 (three) times daily.     lactulose (CHRONULAC) 10 GM/15ML solution take 30 mls BY MOUTH FOUR TIMES DAILY 3600 mL 3   LINZESS 290 MCG CAPS capsule Take 290 mcg by mouth every morning.     lisinopril (ZESTRIL) 40 MG tablet TAKE ONE TABLET BY MOUTH EVERY EVENING 90 tablet 0  metoprolol tartrate (LOPRESSOR) 25 MG tablet TAKE 1 TABLET BY MOUTH TWICE A DAY (Patient not taking: Reported on 01/30/2022) 180 tablet 0   mirtazapine (REMERON) 7.5 MG tablet Take 7.5 mg by mouth at bedtime.     naloxone (NARCAN) 4 MG/0.1ML LIQD nasal spray kit Place 1 spray into the nose as needed (opioid overdose).     Naloxone HCl (NARCAN IJ) Inject 1 Dose as directed as needed (opioid overdose).     naproxen (NAPROSYN) 500 MG tablet Take 500 mg by mouth 2 (two) times daily.     omeprazole (PRILOSEC) 40 MG capsule TAKE 1 CAPSULE BY MOUTH TWICE A DAY 180 capsule 3   oxyCODONE (ROXICODONE) 15 MG immediate release tablet Take 15  mg by mouth.     SPIRIVA RESPIMAT 2.5 MCG/ACT AERS INHALE 1 PUFF BY MOUTH INTO LUNGS ONCE DAILY 4 g 0   sucralfate (CARAFATE) 1 GM/10ML suspension TAKE 10 MLS (1 G TOTAL) BY MOUTH 4 (FOUR) TIMES DAILY. 840 mL 0   SUMAtriptan (IMITREX) 25 MG tablet Take 1 tablet at onset of headache. May repeat 1 dose in 2 hours if needed. Max 2 tabs in 24 hours. 10 tablet 3   SYMBICORT 160-4.5 MCG/ACT inhaler INHALE TWO PUFFS BY MOUTH INTO LUNGS IN THE MORNING and AT BEDTIME 10.2 g 0   tiZANidine (ZANAFLEX) 4 MG tablet Take 1 tablet (4 mg total) by mouth every 6 (six) hours as needed for muscle spasms. 30 tablet 6   No current facility-administered medications for this visit.    Allergies as of 02/21/2022 - Review Complete 02/12/2022  Allergen Reaction Noted   Cyclobenzaprine Hives, Swelling, and Rash 11/13/2013   Levofloxacin Hives, Swelling, and Rash 02/27/2012   Chlorpheniramine maleate  02/12/2022   Phenergan [promethazine hcl] Other (See Comments) 02/21/2017   Phenylephrine hcl Other (See Comments) 02/12/2022   Toradol [ketorolac tromethamine] Swelling and Other (See Comments) 01/17/2015   Zoloft [sertraline hcl] Swelling 05/31/2015   Cephalexin Hives and Rash 10/22/2010   Ketorolac Rash 10/22/2010   Tramadol Hives, Swelling, Other (See Comments), and Rash 01/17/2015    ROS:  General: Negative for anorexia, weight loss, fever, chills, fatigue, weakness. ENT: Negative for hoarseness, difficulty swallowing , nasal congestion. CV: Negative for chest pain, angina, palpitations, dyspnea on exertion, peripheral edema.  Respiratory: Negative for dyspnea at rest, dyspnea on exertion, cough, sputum, wheezing.  GI: See history of present illness. GU:  Negative for dysuria, hematuria, urinary incontinence, urinary frequency, nocturnal urination.  Endo: Negative for unusual weight change.    Physical Examination:   BP 112/68   Pulse 85   Temp 98.1 F (36.7 C) (Oral)   LMP 12/16/2017 (Approximate)    General: Well-nourished, well-developed in no acute distress.  Eyes: No icterus. Conjunctivae pink. Neuro: Alert and oriented x 3.  Grossly intact. Skin: Warm and dry, no jaundice.   Psych: Alert and cooperative, normal mood and affect.   Imaging Studies: CT Ankle Left Wo Contrast  Result Date: 02/12/2022 CLINICAL DATA:  Left ankle pain after fall.  Distal tibial fracture EXAM: CT OF THE LEFT ANKLE WITHOUT CONTRAST TECHNIQUE: Multidetector CT imaging of the left ankle was performed according to the standard protocol. Multiplanar CT image reconstructions were also generated. RADIATION DOSE REDUCTION: This exam was performed according to the departmental dose-optimization program which includes automated exposure control, adjustment of the mA and/or kV according to patient size and/or use of iterative reconstruction technique. COMPARISON:  X-ray 02/12/2022 FINDINGS: Bones/Joint/Cartilage Acute obliquely oriented nondisplaced fracture of  the distal tibia with involvement of the posterior and medial malleolus (series 8, images 41-75). Fracture extends intra-articularly to the tibiotalar joint without significant articular surface depression or diastasis. Additional mildly displaced avulsion fracture from the anterolateral aspect of the tibial plafond, likely reflecting avulsion of the anterior tibiofibular ligament (series 4, image 136). Distal fibula intact without fracture. Ankle mortise is congruent without dislocation. Small tibiotalar joint effusion. Pes cavovarus alignment.  No additional fractures. Ligaments Suboptimally assessed by CT. Muscles and Tendons No acute musculotendinous abnormality by CT. Soft tissues Soft tissue swelling about the ankle, most pronounced anterolaterally. IMPRESSION: 1. Acute nondisplaced fracture of the distal tibia with involvement of the posterior and medial malleolus. 2. Additional mildly displaced avulsion fracture from the anterolateral aspect of the tibial plafond,  likely reflecting avulsion of the anterior tibiofibular ligament. Electronically Signed   By: Davina Poke D.O.   On: 02/12/2022 10:15   DG Knee Complete 4 Views Left  Result Date: 02/12/2022 CLINICAL DATA:  Left leg pain after fall. EXAM: LEFT KNEE - COMPLETE 4+ VIEW COMPARISON:  None Available. FINDINGS: Status post intramedullary rod fixation of old distal left femoral fracture. No acute fracture or dislocation is noted. No joint effusion is noted. Mild narrowing of medial joint space is noted. IMPRESSION: No acute abnormality seen. Electronically Signed   By: Marijo Conception M.D.   On: 02/12/2022 09:16   DG Ankle Complete Left  Result Date: 02/12/2022 CLINICAL DATA:  Left ankle pain after fall. EXAM: LEFT ANKLE COMPLETE - 3+ VIEW COMPARISON:  None Available. FINDINGS: Minimally displaced fracture is seen involving posterior cortex of distal left tibia. Joint spaces are intact. Ulna is unremarkable. IMPRESSION: Minimally displaced distal left tibial fracture. Electronically Signed   By: Marijo Conception M.D.   On: 02/12/2022 09:14    Assessment and Plan:   PEGGIE HORNAK is a 56 y.o. y/o female  with a prior history of constipation, GI bleed secondary to NSAID use, hepatic steatosis here to see me for chronic abdominal pain which is very likely secondary to inflammation of the GI tract due to chronic NSAID use i.e. BC powder, constipation secondary to oxycodone use and aerophagia secondary to smoking.  She has been advised in the past to stop the [facility] powder as well as smoking which she really has not followed through       Plan  1.  Continue Prilosec 40 mg twice a day. 2.  Stop all NSAID use and continue to do so. 3.  EGD to rule out large ulcer, colonoscopy at the same time due to personal history of tubular adenoma 5 years back 4. Smoker: Advised to strongly stop smoking as it can contribute to aerophagia and nausea. 5.  chronic constipation continue taking Trulance in addition suggest to  add lactulose 20 cc 4 times daily reiterated the need to take the dose of lactulose as required to have adequate bowel movements.   I have discussed alternative options, risks & benefits,  which include, but are not limited to, bleeding, infection, perforation,respiratory complication & drug reaction.  The patient agrees with this plan & written consent will be obtained.     Dr [name]  MD,MRCP [facility]) Follow up in as needed

## 2022-02-21 NOTE — Patient Instructions (Signed)
Please remember to take your Lactulose daily four times a day.

## 2022-02-26 ENCOUNTER — Ambulatory Visit: Payer: Medicaid Other | Admitting: Physician Assistant

## 2022-02-26 NOTE — Progress Notes (Deleted)
Established patient visit   Patient: Sara Munoz   DOB: Sep 05, 1965   56 y.o. Female  MRN: [id number] Visit Date: 02/26/2022  Today's healthcare provider: Mardene Speak, [name]   No chief complaint on file.  Subjective    HPI  Hypertension, follow-up  BP Readings from Last 3 Encounters:  02/21/22 112/68  02/12/22 (!) 150/80  12/12/21 108/70   Wt Readings from Last 3 Encounters:  02/12/22 192 lb 0.3 oz (87.1 kg)  12/12/21 192 lb (87.1 kg)  07/11/21 194 lb (88 kg)     She was last seen for hypertension 1 years ago.  BP at that visit was 131/72. Management since that visit includes continue medication.  She reports {excellent/good/fair/poor:19665} compliance with treatment. She {is/is not:9024} having side effects. {document side effects if present:1} She is following a {diet:21022986} diet. She {is/is not:9024} exercising. She {does/does not:200015} smoke.  Use of agents associated with hypertension: {bp agents assoc with hypertension:511::"none"}.   Outside blood pressures are {***enter patient reported home BP readings, or 'not being checked':1}. Symptoms: {Yes/No:20286} chest pain {Yes/No:20286} chest pressure  {Yes/No:20286} palpitations {Yes/No:20286} syncope  {Yes/No:20286} dyspnea {Yes/No:20286} orthopnea  {Yes/No:20286} paroxysmal nocturnal dyspnea {Yes/No:20286} lower extremity edema   Pertinent labs Lab Results  Component Value Date   CHOL 251 (H) 02/20/2018   HDL 46 02/20/2018   LDLCALC 161 (H) 02/20/2018   TRIG 220 (H) 02/20/2018   CHOLHDL 5.5 (H) 02/20/2018   Lab Results  Component Value Date   NA 132 (L) 12/12/2021   K 3.5 12/12/2021   CREATININE 1.04 (H) 12/12/2021   GFRNONAA >60 12/12/2021   GLUCOSE 110 (H) 12/12/2021   TSH 1.456 12/12/2021     The ASCVD Risk score (Arnett DK, et al., 2019) failed to calculate for the following reasons:   Cannot find a previous HDL lab   Cannot find a previous total cholesterol  lab  ---------------------------------------------------------------------------------------------------   Medications: Outpatient Medications Prior to Visit  Medication Sig   albuterol (PROVENTIL) (2.5 MG/3ML) 0.083% nebulizer solution Take 3 mLs (2.5 mg total) by nebulization every 6 (six) hours as needed for wheezing or shortness of breath.   albuterol (VENTOLIN HFA) 108 (90 Base) MCG/ACT inhaler INHALE TWO PUFFS BY MOUTH INTO LUNGS EVERY 6 HOURS AS NEEDED FOR SHORTNESS OF BREATH OR wheezing   amLODipine (NORVASC) 5 MG tablet TAKE ONE TABLET BY MOUTH ONCE DAILY   atorvastatin (LIPITOR) 20 MG tablet TAKE ONE TABLET BY MOUTH EVERY EVENING   cyclobenzaprine (FLEXERIL) 10 MG tablet Take 1 tablet by mouth in the morning and at bedtime.   docusate sodium (COLACE) 100 MG capsule Take 100 mg by mouth 2 (two) times daily.   doxycycline (VIBRA-TABS) 100 MG tablet Take 1 tablet by mouth in the morning and at bedtime.   DULoxetine (CYMBALTA) 30 MG capsule Take 30 mg by mouth 2 (two) times daily.   Erenumab-aooe (AIMOVIG) 70 MG/ML SOAJ Inject 70 mg into the skin every 30 (thirty) days.   gabapentin (NEURONTIN) 600 MG tablet Take 600 mg by mouth 4 (four) times daily.   hydrOXYzine (ATARAX) 50 MG tablet Take 50 mg by mouth 3 (three) times daily.   ibuprofen (ADVIL) 800 MG tablet Take 800 mg by mouth 2 (two) times daily as needed.   lactulose (CHRONULAC) 10 GM/15ML solution Take 30 mLs by mouth in the morning, at noon, in the evening, and at bedtime.   LINZESS 290 MCG CAPS capsule Take 290 mcg by mouth every morning.   lisinopril (  ZESTRIL) 40 MG tablet TAKE ONE TABLET BY MOUTH EVERY EVENING   metoprolol tartrate (LOPRESSOR) 25 MG tablet TAKE 1 TABLET BY MOUTH TWICE A DAY   mirtazapine (REMERON) 7.5 MG tablet Take 1 tablet by mouth at bedtime.   naloxone (NARCAN) 4 MG/0.1ML LIQD nasal spray kit Place 1 spray into the nose as needed (opioid overdose).   Naloxone HCl (NARCAN IJ) Inject 1 Dose as directed as  needed (opioid overdose).   naproxen (NAPROSYN) 500 MG tablet Take 500 mg by mouth 2 (two) times daily.   omeprazole (PRILOSEC) 40 MG capsule TAKE 1 CAPSULE BY MOUTH TWICE A DAY   ondansetron (ZOFRAN) 4 MG tablet Take 1 tablet (4 mg total) by mouth every 8 (eight) hours as needed for nausea or vomiting.   ondansetron (ZOFRAN-ODT) 4 MG disintegrating tablet Take 4 mg by mouth every 8 (eight) hours as needed.   oxyCODONE (ROXICODONE) 15 MG immediate release tablet Take 15 mg by mouth.   SPIRIVA RESPIMAT 2.5 MCG/ACT AERS INHALE 1 PUFF BY MOUTH INTO LUNGS ONCE DAILY   sucralfate (CARAFATE) 1 GM/10ML suspension TAKE 10 MLS (1 G TOTAL) BY MOUTH 4 (FOUR) TIMES DAILY.   SUMAtriptan (IMITREX) 25 MG tablet Take 1 tablet by mouth as needed.   SYMBICORT 160-4.5 MCG/ACT inhaler INHALE TWO PUFFS BY MOUTH INTO LUNGS IN THE MORNING and AT BEDTIME   tiZANidine (ZANAFLEX) 4 MG tablet Take 1 tablet (4 mg total) by mouth every 6 (six) hours as needed for muscle spasms.   No facility-administered medications prior to visit.    Review of Systems  {Labs  Heme  Chem  Endocrine  Serology  Results Review (optional):23779}   Objective    LMP 12/16/2017 (Approximate)  {Show previous vital signs (optional):23777}  Physical Exam  ***  No results found for any visits on 02/26/22.  Assessment & Plan     ***  No follow-ups on file.      {provider attestation***:1}   Mardene Speak, Hershal Coria  [facility] [phone] (phone) [phone] (fax)  [place]

## 2022-03-04 ENCOUNTER — Ambulatory Visit: Payer: Medicaid Other | Admitting: Physician Assistant

## 2022-03-05 ENCOUNTER — Ambulatory Visit: Payer: Medicaid Other | Admitting: Family Medicine

## 2022-03-05 NOTE — Progress Notes (Deleted)
Established patient visit   Patient: Sara Munoz   DOB: 04-Feb-1966   56 y.o. Female  MRN: [id number] Visit Date: 03/05/2022  Today's healthcare provider: Eulis Foster, MD   No chief complaint on file.  Subjective    HPI  Hypertension, follow-up  BP Readings from Last 3 Encounters:  02/21/22 112/68  02/12/22 (!) 150/80  12/12/21 108/70   Wt Readings from Last 3 Encounters:  02/12/22 192 lb 0.3 oz (87.1 kg)  12/12/21 192 lb (87.1 kg)  07/11/21 194 lb (88 kg)     She was last seen for hypertension 1 years ago.  BP at that visit was 131/72. Management since that visit includes continue current treatment.  She reports {excellent/good/fair/poor:19665} compliance with treatment. She {is/is not:9024} having side effects. {document side effects if present:1} She is following a {diet:21022986} diet. She {is/is not:9024} exercising. She {does/does not:200015} smoke.  Use of agents associated with hypertension: {bp agents assoc with hypertension:511::"none"}.   Outside blood pressures are {***enter patient reported home BP readings, or 'not being checked':1}. Symptoms: {Yes/No:20286} chest pain {Yes/No:20286} chest pressure  {Yes/No:20286} palpitations {Yes/No:20286} syncope  {Yes/No:20286} dyspnea {Yes/No:20286} orthopnea  {Yes/No:20286} paroxysmal nocturnal dyspnea {Yes/No:20286} lower extremity edema   Pertinent labs Lab Results  Component Value Date   CHOL 251 (H) 02/20/2018   HDL 46 02/20/2018   LDLCALC 161 (H) 02/20/2018   TRIG 220 (H) 02/20/2018   CHOLHDL 5.5 (H) 02/20/2018   Lab Results  Component Value Date   NA 132 (L) 12/12/2021   K 3.5 12/12/2021   CREATININE 1.04 (H) 12/12/2021   GFRNONAA >60 12/12/2021   GLUCOSE 110 (H) 12/12/2021   TSH 1.456 12/12/2021     The ASCVD Risk score (Arnett DK, et al., 2019) failed to calculate for the following reasons:   Cannot find a previous HDL lab   Cannot find a previous total cholesterol  lab  ---------------------------------------------------------------------------------------------------   Medications: Outpatient Medications Prior to Visit  Medication Sig   albuterol (PROVENTIL) (2.5 MG/3ML) 0.083% nebulizer solution Take 3 mLs (2.5 mg total) by nebulization every 6 (six) hours as needed for wheezing or shortness of breath.   albuterol (VENTOLIN HFA) 108 (90 Base) MCG/ACT inhaler INHALE TWO PUFFS BY MOUTH INTO LUNGS EVERY 6 HOURS AS NEEDED FOR SHORTNESS OF BREATH OR wheezing   amLODipine (NORVASC) 5 MG tablet TAKE ONE TABLET BY MOUTH ONCE DAILY   atorvastatin (LIPITOR) 20 MG tablet TAKE ONE TABLET BY MOUTH EVERY EVENING   cyclobenzaprine (FLEXERIL) 10 MG tablet Take 1 tablet by mouth in the morning and at bedtime.   docusate sodium (COLACE) 100 MG capsule Take 100 mg by mouth 2 (two) times daily.   doxycycline (VIBRA-TABS) 100 MG tablet Take 1 tablet by mouth in the morning and at bedtime.   DULoxetine (CYMBALTA) 30 MG capsule Take 30 mg by mouth 2 (two) times daily.   Erenumab-aooe (AIMOVIG) 70 MG/ML SOAJ Inject 70 mg into the skin every 30 (thirty) days.   gabapentin (NEURONTIN) 600 MG tablet Take 600 mg by mouth 4 (four) times daily.   hydrOXYzine (ATARAX) 50 MG tablet Take 50 mg by mouth 3 (three) times daily.   ibuprofen (ADVIL) 800 MG tablet Take 800 mg by mouth 2 (two) times daily as needed.   lactulose (CHRONULAC) 10 GM/15ML solution Take 30 mLs by mouth in the morning, at noon, in the evening, and at bedtime.   LINZESS 290 MCG CAPS capsule Take 290 mcg by mouth every morning.  lisinopril (ZESTRIL) 40 MG tablet TAKE ONE TABLET BY MOUTH EVERY EVENING   metoprolol tartrate (LOPRESSOR) 25 MG tablet TAKE 1 TABLET BY MOUTH TWICE A DAY   mirtazapine (REMERON) 7.5 MG tablet Take 1 tablet by mouth at bedtime.   naloxone (NARCAN) 4 MG/0.1ML LIQD nasal spray kit Place 1 spray into the nose as needed (opioid overdose).   Naloxone HCl (NARCAN IJ) Inject 1 Dose as directed as  needed (opioid overdose).   naproxen (NAPROSYN) 500 MG tablet Take 500 mg by mouth 2 (two) times daily.   omeprazole (PRILOSEC) 40 MG capsule TAKE 1 CAPSULE BY MOUTH TWICE A DAY   ondansetron (ZOFRAN) 4 MG tablet Take 1 tablet (4 mg total) by mouth every 8 (eight) hours as needed for nausea or vomiting.   ondansetron (ZOFRAN-ODT) 4 MG disintegrating tablet Take 4 mg by mouth every 8 (eight) hours as needed.   oxyCODONE (ROXICODONE) 15 MG immediate release tablet Take 15 mg by mouth.   SPIRIVA RESPIMAT 2.5 MCG/ACT AERS INHALE 1 PUFF BY MOUTH INTO LUNGS ONCE DAILY   sucralfate (CARAFATE) 1 GM/10ML suspension TAKE 10 MLS (1 G TOTAL) BY MOUTH 4 (FOUR) TIMES DAILY.   SUMAtriptan (IMITREX) 25 MG tablet Take 1 tablet by mouth as needed.   SYMBICORT 160-4.5 MCG/ACT inhaler INHALE TWO PUFFS BY MOUTH INTO LUNGS IN THE MORNING and AT BEDTIME   tiZANidine (ZANAFLEX) 4 MG tablet Take 1 tablet (4 mg total) by mouth every 6 (six) hours as needed for muscle spasms.   No facility-administered medications prior to visit.    Review of Systems  {Labs  Heme  Chem  Endocrine  Serology  Results Review (optional):23779}   Objective    LMP 12/16/2017 (Approximate)  {Show previous vital signs (optional):23777}  Physical Exam  ***  No results found for any visits on 03/05/22.  Assessment & Plan     ***  No follow-ups on file.      {provider attestation***:1}   Eulis Foster, MD  [facility] [phone] (phone) [phone] (fax)  [place]

## 2022-03-11 ENCOUNTER — Encounter: Payer: Self-pay | Admitting: Family Medicine

## 2022-03-11 ENCOUNTER — Ambulatory Visit: Payer: Medicaid Other | Admitting: Family Medicine

## 2022-03-11 ENCOUNTER — Other Ambulatory Visit: Payer: Self-pay | Admitting: Obstetrics and Gynecology

## 2022-03-11 VITALS — BP 116/75 | HR 67 | Temp 97.5°F | Resp 16 | Ht 64.0 in | Wt 180.0 lb

## 2022-03-11 DIAGNOSIS — I2510 Atherosclerotic heart disease of native coronary artery without angina pectoris: Secondary | ICD-10-CM | POA: Diagnosis not present

## 2022-03-11 DIAGNOSIS — E785 Hyperlipidemia, unspecified: Secondary | ICD-10-CM

## 2022-03-11 DIAGNOSIS — F17219 Nicotine dependence, cigarettes, with unspecified nicotine-induced disorders: Secondary | ICD-10-CM | POA: Diagnosis not present

## 2022-03-11 DIAGNOSIS — J432 Centrilobular emphysema: Secondary | ICD-10-CM | POA: Diagnosis not present

## 2022-03-11 DIAGNOSIS — I10 Essential (primary) hypertension: Secondary | ICD-10-CM | POA: Diagnosis not present

## 2022-03-11 DIAGNOSIS — I6529 Occlusion and stenosis of unspecified carotid artery: Secondary | ICD-10-CM

## 2022-03-11 MED ORDER — METOPROLOL TARTRATE 25 MG PO TABS: 25.0000 mg | ORAL_TABLET | Freq: Two times a day (BID) | ORAL | 0 refills | Status: DC

## 2022-03-11 MED ORDER — ATORVASTATIN CALCIUM 20 MG PO TABS
20.0000 mg | ORAL_TABLET | Freq: Every evening | ORAL | 0 refills | Status: DC
Start: 2022-03-11 — End: 2022-07-19

## 2022-03-11 MED ORDER — LISINOPRIL 40 MG PO TABS: 40.0000 mg | ORAL_TABLET | Freq: Every evening | ORAL | 0 refills | Status: DC

## 2022-03-11 MED ORDER — ALBUTEROL SULFATE HFA 108 (90 BASE) MCG/ACT IN AERS: INHALATION_SPRAY | RESPIRATORY_TRACT | 0 refills | Status: DC

## 2022-03-11 MED ORDER — AMLODIPINE BESYLATE 5 MG PO TABS: 5.0000 mg | ORAL_TABLET | Freq: Every day | ORAL | 0 refills | Status: DC

## 2022-03-11 MED ORDER — NICOTINE 7 MG/24HR TD PT24
7.0000 mg | MEDICATED_PATCH | Freq: Every day | TRANSDERMAL | 3 refills | Status: DC
Start: 2022-03-11 — End: 2022-10-23

## 2022-03-11 MED ORDER — ALBUTEROL SULFATE (2.5 MG/3ML) 0.083% IN NEBU: 2.5000 mg | INHALATION_SOLUTION | Freq: Four times a day (QID) | RESPIRATORY_TRACT | 12 refills | Status: DC | PRN

## 2022-03-11 MED ORDER — SPIRIVA RESPIMAT 2.5 MCG/ACT IN AERS
INHALATION_SPRAY | RESPIRATORY_TRACT | 0 refills | Status: DC
Start: 2022-03-11 — End: 2023-01-16

## 2022-03-11 MED ORDER — ONDANSETRON HCL 8 MG PO TABS: 8.0000 mg | ORAL_TABLET | Freq: Three times a day (TID) | ORAL | 1 refills | Status: DC | PRN

## 2022-03-11 MED ORDER — DULERA 100-5 MCG/ACT IN AERO: 2.0000 | INHALATION_SPRAY | Freq: Every day | RESPIRATORY_TRACT | 0 refills | Status: DC

## 2022-03-11 NOTE — Assessment & Plan Note (Signed)
Chronic,stable Patient previously followed by cardiology and needs referral to re-establish care to monitor for this chronic issue  Reports occasional chest pain today

## 2022-03-11 NOTE — Patient Instructions (Signed)
Hi Ms. Madruga, I am sorry I missed you today, I hope you are doing okay- as a part of your Medicaid benefit, you are eligible for care management and care coordination services at no cost or copay. I was unable to reach you by phone today but would be happy to help you with your health related needs. Please feel free to call me  T [phone].  A member of the Managed Medicaid care management team will reach out to you again over the next 30 business  days.   [name] RN, BSN [place]  [facility] - Managed Medicaid High Risk [phone]

## 2022-03-11 NOTE — Patient Outreach (Signed)
Care Coordination  03/11/2022  SHAIMA SARDINAS 1965/08/28 [id number]   Medicaid Managed Care   Unsuccessful Outreach Note  03/11/2022 Name: Sara Munoz MRN: [id number] DOB: [birth year]  Referred by: Eulis Foster, MD Reason for referral : High Risk Managed Medicaid (Unsuccessful telephone outreach)   An unsuccessful telephone outreach was attempted today. The patient was referred to the case management team for assistance with care management and care coordination.   Follow Up Plan: The care management team will reach out to the patient again over the next 30 business  days.   [name] RN, BSN [place]  [facility] - Managed Medicaid High Risk [phone]

## 2022-03-11 NOTE — Progress Notes (Signed)
Established patient visit  I,Joseline E Rosas,acting as a scribe for [employer], MD.,have documented all relevant documentation on the behalf of Eulis Foster, MD,as directed by  Eulis Foster, MD while in the presence of Eulis Foster, MD.   Patient: Sara Munoz   DOB: January 09, 1966   56 y.o. Female  MRN: [id number] Visit Date: 03/11/2022  Today's healthcare provider: Eulis Foster, MD   Chief Complaint  Patient presents with   follow-up chronic disease   Subjective    HPI  Hypertension, follow-up  BP Readings from Last 3 Encounters:  03/11/22 116/75  02/21/22 112/68  02/12/22 (!) 150/80   Wt Readings from Last 3 Encounters:  03/11/22 180 lb (81.6 kg)  02/12/22 192 lb 0.3 oz (87.1 kg)  12/12/21 192 lb (87.1 kg)     She was last seen for hypertension 1 years ago.  BP at that visit was 131/72. Management since that visit includes none.  She reports excellent compliance with treatment. She is not having side effects.  She reports not eating much. She is not exercising. She does smoke  She reports sometimes having higher blood pressures She reports she has been out of blood pressure medications for ~3 weeks  She states she sometimes has blurry vision and is working on establishing with eye doctor  She reports that she has some intermittent chest pain that occurs rarely   Outside blood pressures are not being checked. Symptoms: No chest pain No chest pressure  No palpitations No syncope  No dyspnea No orthopnea  No paroxysmal nocturnal dyspnea No lower extremity edema   Pertinent labs Lab Results  Component Value Date   CHOL 251 (H) 02/20/2018   HDL 46 02/20/2018   LDLCALC 161 (H) 02/20/2018   TRIG 220 (H) 02/20/2018   CHOLHDL 5.5 (H) 02/20/2018   Lab Results  Component Value Date   NA 132 (L) 12/12/2021   K 3.5 12/12/2021   CREATININE 1.04 (H) 12/12/2021   GFRNONAA >60 12/12/2021   GLUCOSE 110  (H) 12/12/2021   TSH 1.456 12/12/2021     Lipid/Cholesterol, Follow-up  Last lipid panel Other pertinent labs  Lab Results  Component Value Date   CHOL 251 (H) 02/20/2018   HDL 46 02/20/2018   LDLCALC 161 (H) 02/20/2018   TRIG 220 (H) 02/20/2018   CHOLHDL 5.5 (H) 02/20/2018   Lab Results  Component Value Date   ALT 14 12/12/2021   AST 24 12/12/2021   PLT 326 12/12/2021   TSH 1.456 12/12/2021     She was last seen for this 1 years ago.  Management since that visit includes continue Statin.  She reports fair compliance with treatment. She is not having side effects.   Symptoms: No chest pain No chest pressure/discomfort  No dyspnea No lower extremity edema  Yes numbness or tingling of extremity No orthopnea  No palpitations No paroxysmal nocturnal dyspnea  No speech difficulty No syncope   Tobacco Use   1 PPD, she expresses interest in smoking cessation, she reports she has smoked since age 20 and used to smoke 2 PPD or more over the years   The ASCVD Risk score (Arnett DK, et al., 2019) failed to calculate for the following reasons:   Cannot find a previous HDL lab   Cannot find a previous total cholesterol lab  ---------------------------------------------------------------------------------------------------  Follow up for COPD  Patient reports that she feels stable with her symptoms. Need refills on meds. Reports it had been a  few months since she was seen by pulmonary. She states that anxiety and stress has contributed to her breathing symptoms including feeling more SOB  It is not often that she has productive cough  She states that she sometimes has sob with ambulating to restroom  -----------------------------------------------------------------------------------------  Medications: Outpatient Medications Prior to Visit  Medication Sig   docusate sodium (COLACE) 100 MG capsule Take 100 mg by mouth 2 (two) times daily.   gabapentin (NEURONTIN) 600 MG tablet  Take 600 mg by mouth 4 (four) times daily.   lactulose (CHRONULAC) 10 GM/15ML solution Take 30 mLs by mouth in the morning, at noon, in the evening, and at bedtime.   LINZESS 290 MCG CAPS capsule Take 290 mcg by mouth every morning.   naloxone (NARCAN) 4 MG/0.1ML LIQD nasal spray kit Place 1 spray into the nose as needed (opioid overdose).   Naloxone HCl (NARCAN IJ) Inject 1 Dose as directed as needed (opioid overdose).   naproxen (NAPROSYN) 500 MG tablet Take 500 mg by mouth 2 (two) times daily.   omeprazole (PRILOSEC) 40 MG capsule TAKE 1 CAPSULE BY MOUTH TWICE A DAY   oxyCODONE (ROXICODONE) 15 MG immediate release tablet Take 15 mg by mouth.   sucralfate (CARAFATE) 1 GM/10ML suspension TAKE 10 MLS (1 G TOTAL) BY MOUTH 4 (FOUR) TIMES DAILY.   SUMAtriptan (IMITREX) 25 MG tablet Take 1 tablet by mouth as needed.   [DISCONTINUED] albuterol (PROVENTIL) (2.5 MG/3ML) 0.083% nebulizer solution Take 3 mLs (2.5 mg total) by nebulization every 6 (six) hours as needed for wheezing or shortness of breath.   [DISCONTINUED] albuterol (VENTOLIN HFA) 108 (90 Base) MCG/ACT inhaler INHALE TWO PUFFS BY MOUTH INTO LUNGS EVERY 6 HOURS AS NEEDED FOR SHORTNESS OF BREATH OR wheezing   [DISCONTINUED] amLODipine (NORVASC) 5 MG tablet TAKE ONE TABLET BY MOUTH ONCE DAILY   [DISCONTINUED] atorvastatin (LIPITOR) 20 MG tablet TAKE ONE TABLET BY MOUTH EVERY EVENING   [DISCONTINUED] lisinopril (ZESTRIL) 40 MG tablet TAKE ONE TABLET BY MOUTH EVERY EVENING   [DISCONTINUED] metoprolol tartrate (LOPRESSOR) 25 MG tablet TAKE 1 TABLET BY MOUTH TWICE A DAY   [DISCONTINUED] ondansetron (ZOFRAN) 4 MG tablet Take 1 tablet (4 mg total) by mouth every 8 (eight) hours as needed for nausea or vomiting. (Patient taking differently: Take 4 mg by mouth every 8 (eight) hours as needed for nausea or vomiting.)   [DISCONTINUED] ondansetron (ZOFRAN-ODT) 4 MG disintegrating tablet Take 4 mg by mouth every 8 (eight) hours as needed.   [DISCONTINUED]  SPIRIVA RESPIMAT 2.5 MCG/ACT AERS INHALE 1 PUFF BY MOUTH INTO LUNGS ONCE DAILY   [DISCONTINUED] SYMBICORT 160-4.5 MCG/ACT inhaler INHALE TWO PUFFS BY MOUTH INTO LUNGS IN THE MORNING and AT BEDTIME   [DISCONTINUED] tiZANidine (ZANAFLEX) 4 MG tablet Take 1 tablet (4 mg total) by mouth every 6 (six) hours as needed for muscle spasms.   [DISCONTINUED] cyclobenzaprine (FLEXERIL) 10 MG tablet Take 1 tablet by mouth in the morning and at bedtime.   [DISCONTINUED] doxycycline (VIBRA-TABS) 100 MG tablet Take 1 tablet by mouth in the morning and at bedtime.   [DISCONTINUED] DULoxetine (CYMBALTA) 30 MG capsule Take 30 mg by mouth 2 (two) times daily.   [DISCONTINUED] Erenumab-aooe (AIMOVIG) 70 MG/ML SOAJ Inject 70 mg into the skin every 30 (thirty) days.   [DISCONTINUED] hydrOXYzine (ATARAX) 50 MG tablet Take 50 mg by mouth 3 (three) times daily.   [DISCONTINUED] ibuprofen (ADVIL) 800 MG tablet Take 800 mg by mouth 2 (two) times daily as needed.   [  DISCONTINUED] mirtazapine (REMERON) 7.5 MG tablet Take 1 tablet by mouth at bedtime.   No facility-administered medications prior to visit.    Review of Systems  Constitutional:  Positive for appetite change and fatigue. Negative for chills and fever.  Eyes:  Negative for visual disturbance.  Respiratory:  Positive for shortness of breath ("COPD"). Negative for chest tightness.   Cardiovascular:  Negative for chest pain, palpitations and leg swelling.  Gastrointestinal:  Positive for abdominal pain and nausea. Negative for vomiting.  Neurological:  Positive for dizziness and weakness.       Objective    BP 116/75 (BP Location: Left Arm, Patient Position: Sitting, Cuff Size: Normal)   Pulse 67   Temp (!) 97.5 F (36.4 C) (Oral)   Resp 16   Ht 5\' 4"  (1.626 m)   Wt 180 lb (81.6 kg)   LMP 12/16/2017 (Approximate)   BMI 30.90 kg/m    Physical Exam Constitutional:      General: She is not in acute distress.    Appearance: Normal appearance. She is  not ill-appearing, toxic-appearing or diaphoretic.  Eyes:     General: No scleral icterus.    Conjunctiva/sclera: Conjunctivae normal.  Cardiovascular:     Rate and Rhythm: Normal rate and regular rhythm.  Pulmonary:     Effort: Pulmonary effort is normal. No respiratory distress.     Breath sounds: No stridor. Wheezing and rhonchi present. No rales.  Abdominal:     Palpations: Abdomen is soft.     Tenderness: There is no abdominal tenderness.  Musculoskeletal:        General: Signs of injury present.     Comments: Wearing boot on LLE, ambulating with crutches   Neurological:     Mental Status: She is alert.      No results found for any visits on 03/11/22.  Assessment & Plan     Problem List Items Addressed This Visit       Cardiovascular and Mediastinum   Non-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/2014    Chronic,stable Patient previously followed by cardiology and needs referral to re-establish care to monitor for this chronic issue  Reports occasional chest pain today       Relevant Medications   amLODipine (NORVASC) 5 MG tablet   atorvastatin (LIPITOR) 20 MG tablet   lisinopril (ZESTRIL) 40 MG tablet   metoprolol tartrate (LOPRESSOR) 25 MG tablet   Other Relevant Orders   Ambulatory referral to Cardiology   Hypertension    Chronic, controlled on current medications  BP within goal range today  She will continue amlodipine 5mg  daily, lisinopril 40mg  daily and metoprolol 25mg  daily   Refills for antihypertensives sent to pharmacy       Relevant Medications   amLODipine (NORVASC) 5 MG tablet   atorvastatin (LIPITOR) 20 MG tablet   lisinopril (ZESTRIL) 40 MG tablet   metoprolol tartrate (LOPRESSOR) 25 MG tablet   Carotid artery stenosis   Relevant Medications   amLODipine (NORVASC) 5 MG tablet   atorvastatin (LIPITOR) 20 MG tablet   lisinopril (ZESTRIL) 40 MG tablet   metoprolol tartrate (LOPRESSOR) 25 MG tablet   Other Relevant Orders   Ambulatory referral to  Cardiology   Essential hypertension   Relevant Medications   amLODipine (NORVASC) 5 MG tablet   atorvastatin (LIPITOR) 20 MG tablet   lisinopril (ZESTRIL) 40 MG tablet   metoprolol tartrate (LOPRESSOR) 25 MG tablet     Respiratory   Chronic obstructive pulmonary disease (HCC) - Primary  Chronic, uncontrolled  Patient has been out of her inhalers  Will continue regimen including Dulera 100-71mg inhaler 2 puffs daily, albuterol inhaler 1078m and nebulizer and Spiriva 2.25m17m Refills sent to patient's pharmacy   Referral for pulmonology submitted in order for patient to re-establish care       Relevant Medications   albuterol (VENTOLIN HFA) 108 (90 Base) MCG/ACT inhaler   mometasone-formoterol (DULERA) 100-5 MCG/ACT AERO   Tiotropium Bromide Monohydrate (SPIRIVA RESPIMAT) 2.5 MCG/ACT AERS   albuterol (PROVENTIL) (2.5 MG/3ML) 0.083% nebulizer solution   nicotine (NICODERM CQ - DOSED IN MG/24 HR) 7 mg/24hr patch   Other Relevant Orders   Ambulatory referral to Pulmonology     Nervous and Auditory   Cigarette nicotine dependence with nicotine-induced disorder    Chronic, improving  She reports weaning to 1PPD from >2PPD in the past  Will prescribe nicotine patches to help with cessation       Relevant Medications   nicotine (NICODERM CQ - DOSED IN MG/24 HR) 7 mg/24hr patch     Other   Hyperlipidemia    Chronic Will refill atorvastatin 48m325mily  Will plan to check lipid panel at patient's physical 03/22/22      Relevant Medications   amLODipine (NORVASC) 5 MG tablet   atorvastatin (LIPITOR) 20 MG tablet   lisinopril (ZESTRIL) 40 MG tablet   metoprolol tartrate (LOPRESSOR) 25 MG tablet     Return in about 11 days (around 03/22/2022) for CPE.     I, MakiEulis Foster, have reviewed all documentation for this visit. The documentation on 03/11/22 for the exam, diagnosis, procedures, and orders are all accurate and complete.    MakiEulis Foster   [facility]-[phone]) [phone])  [place]

## 2022-03-11 NOTE — Assessment & Plan Note (Signed)
Chronic, improving  She reports weaning to 1PPD from >2PPD in the past  Will prescribe nicotine patches to help with cessation

## 2022-03-11 NOTE — Assessment & Plan Note (Signed)
Chronic, uncontrolled  Patient has been out of her inhalers  Will continue regimen including Dulera 100-72mg inhaler 2 puffs daily, albuterol inhaler 102m and nebulizer and Spiriva 2.52m67m Refills sent to patient's pharmacy   Referral for pulmonology submitted in order for patient to re-establish care

## 2022-03-11 NOTE — Assessment & Plan Note (Signed)
Chronic Will refill atorvastatin '20mg'$  daily  Will plan to check lipid panel at patient's physical 03/22/22

## 2022-03-11 NOTE — Assessment & Plan Note (Signed)
Chronic, controlled on current medications  BP within goal range today  She will continue amlodipine '5mg'$  daily, lisinopril '40mg'$  daily and metoprolol '25mg'$  daily   Refills for antihypertensives sent to pharmacy

## 2022-03-13 ENCOUNTER — Ambulatory Visit: Admission: RE | Admit: 2022-03-13 | Payer: Medicaid Other | Source: Home / Self Care | Admitting: Gastroenterology

## 2022-03-13 ENCOUNTER — Encounter: Admission: RE | Payer: Self-pay | Source: Home / Self Care

## 2022-03-13 SURGERY — COLONOSCOPY WITH PROPOFOL
Anesthesia: General

## 2022-03-14 ENCOUNTER — Other Ambulatory Visit: Payer: Self-pay

## 2022-03-14 DIAGNOSIS — G894 Chronic pain syndrome: Secondary | ICD-10-CM | POA: Diagnosis not present

## 2022-03-14 DIAGNOSIS — Z86010 Personal history of colonic polyps: Secondary | ICD-10-CM

## 2022-03-14 DIAGNOSIS — M79621 Pain in right upper arm: Secondary | ICD-10-CM | POA: Diagnosis not present

## 2022-03-14 DIAGNOSIS — G890 Central pain syndrome: Secondary | ICD-10-CM | POA: Diagnosis not present

## 2022-03-14 DIAGNOSIS — M5136 Other intervertebral disc degeneration, lumbar region: Secondary | ICD-10-CM | POA: Diagnosis not present

## 2022-03-14 DIAGNOSIS — M79622 Pain in left upper arm: Secondary | ICD-10-CM | POA: Diagnosis not present

## 2022-03-14 DIAGNOSIS — M79662 Pain in left lower leg: Secondary | ICD-10-CM | POA: Diagnosis not present

## 2022-03-14 DIAGNOSIS — M542 Cervicalgia: Secondary | ICD-10-CM | POA: Diagnosis not present

## 2022-03-14 DIAGNOSIS — M25512 Pain in left shoulder: Secondary | ICD-10-CM | POA: Diagnosis not present

## 2022-03-14 DIAGNOSIS — K5900 Constipation, unspecified: Secondary | ICD-10-CM

## 2022-03-14 DIAGNOSIS — M79661 Pain in right lower leg: Secondary | ICD-10-CM | POA: Diagnosis not present

## 2022-03-14 DIAGNOSIS — R109 Unspecified abdominal pain: Secondary | ICD-10-CM

## 2022-03-14 DIAGNOSIS — M545 Low back pain, unspecified: Secondary | ICD-10-CM | POA: Diagnosis not present

## 2022-03-14 DIAGNOSIS — M25511 Pain in right shoulder: Secondary | ICD-10-CM | POA: Diagnosis not present

## 2022-03-14 NOTE — Progress Notes (Signed)
Colonoscopy w/ EGD rescheduled to 04/04/22.    Thanks, [place], [place]

## 2022-03-15 ENCOUNTER — Other Ambulatory Visit: Payer: Self-pay | Admitting: Obstetrics and Gynecology

## 2022-03-15 DIAGNOSIS — S129XXD Fracture of neck, unspecified, subsequent encounter: Secondary | ICD-10-CM | POA: Diagnosis not present

## 2022-03-15 DIAGNOSIS — S82892D Other fracture of left lower leg, subsequent encounter for closed fracture with routine healing: Secondary | ICD-10-CM | POA: Diagnosis not present

## 2022-03-15 NOTE — Patient Outreach (Signed)
Care Coordination  03/15/2022  Sara Munoz December 03, 1965 [id number]  RNCM returned patient's phone call, discussed follow up appointments.  [name] RN, BSN [place]  [facility] - Managed Medicaid High Risk [id number].

## 2022-03-16 DIAGNOSIS — J449 Chronic obstructive pulmonary disease, unspecified: Secondary | ICD-10-CM | POA: Diagnosis not present

## 2022-03-16 DIAGNOSIS — S82209A Unspecified fracture of shaft of unspecified tibia, initial encounter for closed fracture: Secondary | ICD-10-CM | POA: Diagnosis not present

## 2022-03-18 DIAGNOSIS — R32 Unspecified urinary incontinence: Secondary | ICD-10-CM | POA: Diagnosis not present

## 2022-03-22 ENCOUNTER — Ambulatory Visit: Payer: Medicaid Other | Admitting: Family Medicine

## 2022-03-25 NOTE — Progress Notes (Deleted)
Complete physical exam   Patient: Sara Munoz   DOB: 07-Nov-1965   56 y.o. Female  MRN: [id number] Visit Date: 03/26/2022  Today's healthcare provider: Eulis Foster, MD   No chief complaint on file.  Subjective    Sara Munoz is a 56 y.o. female who presents today for a complete physical exam.  She reports consuming a {diet types:17450} diet. {Exercise:19826} She generally feels {well/fairly well/poorly:18703}. She reports sleeping {well/fairly well/poorly:18703}. She {does/does not:200015} have additional problems to discuss today.  HPI  ***  Past Medical History:  Diagnosis Date  . Anemia   . Anxiety   . Asthma   . Atypical chest pain 08/02/2015  . CAD (coronary artery disease)   . Chronic back pain   . COPD (chronic obstructive pulmonary disease) ([place])   . Coronary artery disease    a. Mild-non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/2014  . GERD (gastroesophageal reflux disease)   . Hepatitis C    per patient, "took medicines for it and no longer has it"  . Hypercholesteremia   . Hypertension   . Hypokalemia   . Hypomagnesemia 01/04/2014  . Liver disease   . MI (myocardial infarction) ([place])   . Nerve pain    per patient, in the lower back  . Opiate use 02/27/2016  . Osteoarthritis   . Pneumonia   . Stroke [facility])    Past Surgical History:  Procedure Laterality Date  . ANTERIOR CERVICAL DECOMP/DISCECTOMY FUSION N/A 03/30/2018   Procedure: Cervical six-seven Anterior cervical decompression/discectomy/fusion;  Surgeon: Judith Part, MD;  Location: [place];  Service: Neurosurgery;  Laterality: N/A;  . ANTERIOR CERVICAL DECOMP/DISCECTOMY FUSION N/A 01/11/2019   Procedure: Cervical Five-Six Anterior cervical discectomy fusion,  Cervical Five to Cervical Seven anterior instrumented fusion;  Surgeon: Judith Part, MD;  Location: [place];  Service: Neurosurgery;  Laterality: N/A;  Cervical Five-Six Anterior cervical discectomy fusion,  Cervical Five to  Cervical Seven anterior instrumented fusion  . BACK SURGERY    . CARDIAC CATHETERIZATION Left 02/22/2016   Procedure: Left Heart Cath and Coronary Angiography;  Surgeon: Yolonda Kida, MD;  Location: [place] [facility];  Service: Cardiovascular;  Laterality: Left;  . CATARACT EXTRACTION W/ INTRAOCULAR LENS IMPLANT Right   . COLONOSCOPY WITH PROPOFOL N/A 09/19/2016   Procedure: COLONOSCOPY WITH PROPOFOL;  Surgeon: [name], MD;  Location: [facility] ENDOSCOPY;  Service: Endoscopy;  Laterality: N/A;  . DIAGNOSTIC LAPAROSCOPY    . ESOPHAGOGASTRODUODENOSCOPY (EGD) WITH PROPOFOL N/A 09/18/2017   Procedure: ESOPHAGOGASTRODUODENOSCOPY (EGD) WITH PROPOFOL;  Surgeon: [name], MD;  Location: [facility] ENDOSCOPY;  Service: Gastroenterology;  Laterality: N/A;  . ESOPHAGOGASTRODUODENOSCOPY (EGD) WITH PROPOFOL N/A 08/10/2019   Procedure: ESOPHAGOGASTRODUODENOSCOPY (EGD) WITH PROPOFOL;  Surgeon: [name], MD;  Location: [facility] ENDOSCOPY;  Service: Endoscopy;  Laterality: N/A;  . EYE SURGERY     R eye - cataract  . FRACTURE SURGERY    . hemorroids    . NASAL SINUS SURGERY    . ORIF FEMUR FRACTURE    . ORIF TIBIA & FIBULA FRACTURES    . OVARY SURGERY    . POSTERIOR CERVICAL FUSION/FORAMINOTOMY N/A 05/14/2021   Procedure: Cervical five, Cervical six Laminectomy,foraminotomy with Cervical five-six Cervical six-seven Cervical seven-Thoracic one Posterior instrumented fusion;  Surgeon: Judith Part, MD;  Location: [place];  Service: Neurosurgery;  Laterality: N/A;  . TRANSFORAMINAL LUMBAR INTERBODY FUSION W/ MIS 1 LEVEL Right 05/23/2020   Procedure: Right Lumbar Five Sacral One Minimally invasive transforaminal  lumbar interbody fusion;  Surgeon: Judith Part, MD;  Location: [place];  Service: Neurosurgery;  Laterality: Right;  Right Lumbar Five Sacral One Minimally invasive transforaminal lumbar interbody fusion   Social History   Socioeconomic History  . Marital status: Widowed    Spouse name:  Not on file  . Number of children: 0  . Years of education: Not on file  . Highest education level: 9th grade  Occupational History  . Occupation: disabled  Tobacco Use  . Smoking status: Every Day    Packs/day: 0.50    Years: 35.00    Total pack years: 17.50    Types: Cigarettes  . Smokeless tobacco: Never  . Tobacco comments:    07/2019- 1.5 a day  Vaping Use  . Vaping Use: Former  Substance and Sexual Activity  . Alcohol use: Not Currently    Alcohol/week: 0.0 standard drinks of alcohol  . Drug use: Not Currently    Types: Marijuana  . Sexual activity: Yes    Birth control/protection: None  Other Topics Concern  . Not on file  Social History Narrative   Moved from [place].   Social Determinants of Health   Financial Resource Strain: Medium Risk (09/17/2021)   Overall Financial Resource Strain (CARDIA)   . Difficulty of Paying Living Expenses: Somewhat hard  Food Insecurity: No Food Insecurity (01/30/2022)   Hunger Vital Sign   . Worried About Charity fundraiser in the Last Year: Never true   . Ran Out of Food in the Last Year: Never true  Transportation Needs: No Transportation Needs (09/17/2021)   PRAPARE - Transportation   . Lack of Transportation (Medical): No   . Lack of Transportation (Non-Medical): No  Physical Activity: Inactive (12/24/2021)   Exercise Vital Sign   . Days of Exercise per Week: 0 days   . Minutes of Exercise per Session: 0 min  Stress: Stress Concern Present (10/23/2021)   [place]   . Feeling of Stress : Very much  Social Connections: Socially Isolated (11/23/2021)   Social Connection and Isolation Panel [NHANES]   . Frequency of Communication with Friends and Family: More than three times a week   . Frequency of Social Gatherings with Friends and Family: More than three times a week   . Attends Religious Services: Never   . Active Member of Clubs or Organizations: No   .  Attends Archivist Meetings: Never   . Marital Status: Widowed  Intimate Partner Violence: Not At Risk (01/30/2022)   Humiliation, Afraid, Rape, and Kick questionnaire   . Fear of Current or Ex-Partner: No   . Emotionally Abused: No   . Physically Abused: No   . Sexually Abused: No   Family Status  Relation Name Status  . Mother  Deceased  . Father  Deceased       MVA  . Brother  Alive  . MGM  Deceased  . MGF  Deceased  . PGM  Deceased  . PGF  Deceased  . Brother  Alive  . Brother  Alive  . Mat Uncle  Deceased  . Mat Aunt  (Not Specified)   Family History  Problem Relation Age of Onset  . Heart disease Mother   . Lung cancer Mother   . Ovarian cancer Mother   . Healthy Brother   . Healthy Brother   . Healthy Brother   . Diabetes Maternal Uncle   . Breast cancer  Maternal Aunt    Allergies  Allergen Reactions  . Cyclobenzaprine Hives, Swelling and Rash    Facial/lip swelling     . Levofloxacin Hives, Swelling and Rash  . Chlorpheniramine Maleate     Other reaction(s): Unknown  . Phenergan [Promethazine Hcl] Other (See Comments)    Agitation.   Marland Kitchen Phenylephrine Hcl Other (See Comments)  . Toradol [Ketorolac Tromethamine] Swelling and Other (See Comments)    Facial/tongue swelling   . Zoloft [Sertraline Hcl] Swelling    Tongue swelling     . Cephalexin Hives and Rash  . Ketorolac Rash  . Tramadol Hives, Swelling, Other (See Comments) and Rash    Lip swelling     Patient Care Team: Eulis Foster, MD as PCP - [facility] ([facility]) [facility], [facility], Lorel Monaco, RN as Case Manager Greg Cutter, LCSW as [place] Management (Licensed [place] representative)   Medications: Outpatient Medications Prior to Visit  Medication Sig  . albuterol (PROVENTIL) (2.5 MG/3ML) 0.083% nebulizer solution Take 3 mLs (2.5 mg total) by nebulization every 6 (six) hours as needed for wheezing or shortness of breath.  Marland Kitchen  albuterol (VENTOLIN HFA) 108 (90 Base) MCG/ACT inhaler INHALE TWO PUFFS BY MOUTH INTO LUNGS EVERY 6 HOURS AS NEEDED FOR SHORTNESS OF BREATH OR wheezing  . amLODipine (NORVASC) 5 MG tablet Take 1 tablet (5 mg total) by mouth daily.  Marland Kitchen atorvastatin (LIPITOR) 20 MG tablet Take 1 tablet (20 mg total) by mouth every evening.  . docusate sodium (COLACE) 100 MG capsule Take 100 mg by mouth 2 (two) times daily.  Marland Kitchen gabapentin (NEURONTIN) 600 MG tablet Take 600 mg by mouth 4 (four) times daily.  Marland Kitchen lactulose (CHRONULAC) 10 GM/15ML solution Take 30 mLs by mouth in the morning, at noon, in the evening, and at bedtime.  Marland Kitchen LINZESS 290 MCG CAPS capsule Take 290 mcg by mouth every morning.  Marland Kitchen lisinopril (ZESTRIL) 40 MG tablet Take 1 tablet (40 mg total) by mouth every evening.  . metoprolol tartrate (LOPRESSOR) 25 MG tablet Take 1 tablet (25 mg total) by mouth 2 (two) times daily.  . mometasone-formoterol (DULERA) 100-5 MCG/ACT AERO Inhale 2 puffs into the lungs daily.  . naloxone (NARCAN) 4 MG/0.1ML LIQD nasal spray kit Place 1 spray into the nose as needed (opioid overdose).  . Naloxone HCl (NARCAN IJ) Inject 1 Dose as directed as needed (opioid overdose).  . naproxen (NAPROSYN) 500 MG tablet Take 500 mg by mouth 2 (two) times daily.  . nicotine (NICODERM CQ - DOSED IN MG/24 HR) 7 mg/24hr patch Place 1 patch (7 mg total) onto the skin daily.  Marland Kitchen omeprazole (PRILOSEC) 40 MG capsule TAKE 1 CAPSULE BY MOUTH TWICE A DAY  . ondansetron (ZOFRAN) 8 MG tablet Take 1 tablet (8 mg total) by mouth every 8 (eight) hours as needed for nausea or vomiting.  Marland Kitchen oxyCODONE (ROXICODONE) 15 MG immediate release tablet Take 15 mg by mouth.  . sucralfate (CARAFATE) 1 GM/10ML suspension TAKE 10 MLS (1 G TOTAL) BY MOUTH 4 (FOUR) TIMES DAILY.  . SUMAtriptan (IMITREX) 25 MG tablet Take 1 tablet by mouth as needed.  . Tiotropium Bromide Monohydrate (SPIRIVA RESPIMAT) 2.5 MCG/ACT AERS INHALE 1 PUFF BY MOUTH INTO LUNGS ONCE DAILY   No  facility-administered medications prior to visit.    Review of Systems  All other systems reviewed and are negative.  {Labs  Heme  Chem  Endocrine  Serology  Results Review (optional):23779}  Objective  LMP 12/16/2017 (Approximate)  {Show previous vital signs (optional):23777}   Physical Exam  ***  Last depression screening scores    10/23/2021    2:26 PM 06/01/2021    3:49 PM 01/11/2021   10:26 AM  PHQ 2/9 Scores  PHQ - 2 Score 0 0 3  PHQ- 9 Score  0 16   Last fall risk screening    03/11/2022    2:02 PM  [place] in the past year? 1  Number falls in past yr: 1  Injury with Fall? 1  Risk for fall due to : Impaired balance/gait  Follow up Falls prevention discussed;Falls evaluation completed   Last Audit-C alcohol use screening    03/15/2022   11:12 AM  Alcohol Use Disorder Test (AUDIT)  1. How often do you have a drink containing alcohol? 0  2. How many drinks containing alcohol do you have on a typical day when you are drinking? 0  3. How often do you have six or more drinks on one occasion? 0  AUDIT-C Score 0   A score of 3 or more in women, and 4 or more in men indicates increased risk for alcohol abuse, EXCEPT if all of the points are from question 1   No results found for any visits on 03/26/22.  Assessment & Plan    Routine Health Maintenance and Physical Exam  Exercise Activities and Dietary recommendations  Goals     . Manage My Emotions     Timeframe:  Long-Range Goal Priority:  High Start Date:    01/04/21                         Expected End Date:   ongoing                    Follow Up Date - 05/09/21   - begin personal counseling - call and visit an old friend - check out volunteer opportunities - join a support group - laugh; watch a funny movie or comedian - learn and use visualization or guided imagery - perform a random act of kindness - practice relaxation or meditation daily - start or continue a personal journal -  talk about feelings with a friend, family or spiritual advisor - practice positive thinking and self-talk    Why is this important?   When you are stressed, down or upset, your body reacts too.  For example, your blood pressure may get higher; you may have a headache or stomachache.  When your emotions get the best of you, your body's ability to fight off cold and flu gets weak.  These steps will help you manage your emotions.     Notes:     . Protect My Health     Timeframe:  Long-Range Goal Priority:  High Start Date:       12/20/20                      Expected End Date:    ongoing                   Follow Up Date: 03/12/22 - schedule appointment for flu shot - schedule appointment for vaccines needed due to my age or health - schedule recommended health tests (blood work, mammogram, colonoscopy, pap test) - schedule and keep appointment for annual check-up   01/30/22:  Patient out of BP medicine-to call PCP  today and schedule an appt to get refill Why is this important?   Screening tests can find diseases early when they are easier to treat.  Your doctor or nurse will talk with you about which tests are important for you.  Getting shots for common diseases like the flu and shingles will help prevent them.             Immunization History  Administered Date(s) Administered  . Hepatitis A, Adult 06/26/2017, 01/15/2018  . Hepatitis B, adult 06/26/2017, 07/29/2017, 12/10/2017, 06/23/2018    Health Maintenance  Topic Date Due  . COVID-19 Vaccine (1) Never done  . Zoster Vaccines- Shingrix (1 of 2) Never done  . MAMMOGRAM  07/05/2019  . COLONOSCOPY (Pts 45-78yr Insurance coverage will need to be confirmed)  09/19/2021  . INFLUENZA VACCINE  09/22/2022 (Originally 01/22/2022)  . TETANUS/TDAP  03/12/2023 (Originally [birth year]/1987)  . PAP SMEAR-Modifier  07/29/2022  . Hepatitis C Screening  Completed  . HIV Screening  Completed  . HPV VACCINES  Aged Out    Discussed health  benefits of physical activity, and encouraged her to engage in regular exercise appropriate for her age and condition.  ***  No follow-ups on file.     {provider attestation***:1}   MEulis Foster MD  [facility] [phone](phone) [phone](fax)  [place]

## 2022-03-26 ENCOUNTER — Encounter: Payer: Medicaid Other | Admitting: Family Medicine

## 2022-04-02 NOTE — Progress Notes (Signed)
I,Sara Munoz,acting as a scribe for [employer], MD.,have documented all relevant documentation on the behalf of Sara Foster, MD,as directed by  Sara Foster, MD while in the presence of Sara Foster, MD.   Complete physical exam   Patient: JOELYN Munoz   DOB: 05-08-1966   56 y.o. Female  MRN: [id number] Visit Date: 04/03/2022  Today's healthcare provider: Eulis Foster, MD   Chief Complaint  Patient presents with   Annual Exam   Subjective    Sara Munoz is a 56 y.o. female who presents today for a complete physical exam.  She reports consuming a general diet. The patient does not participate in regular exercise at present. She generally feels well. She reports sleeping fairly well. She does not have additional problems to discuss today.  HPI  Colonoscopy-Patient Canceled for tomorrow, 04/04/22-states that she needs to reschedule  HTN  She reports adherence to regimen including amlodipine 10m, lisinopril 438mand metoprolol 2568mShe has not been checking her BP at home  She denies HA or chest pain today    Request for gyn referral for pap smears Adds that she also has problems with her ovaries that she needs to follow up with gynecology for    Past Medical History:  Diagnosis Date   Anemia    Anxiety    Asthma    Atypical chest pain 08/02/2015   CAD (coronary artery disease)    Chronic back pain    COPD [place]ch[place] obstructive pulmonary disease) ([place]  Coronary artery disease    a. Mild-non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/2014   GERD (gastroesophageal reflux disease)    Hepatitis C    per patient, "took medicines for it and no longer has it"   Hypercholesteremia    Hypertension    Hypokalemia    Hypomagnesemia 01/04/2014   Liver disease    MI [place]ocardial infarction) ([place]  Nerve pain    per patient, in the lower back   Opiate use 02/27/2016   Osteoarthritis    Pneumonia    [facility]   Past Surgical History:  Procedure Laterality Date   ANTERIOR CERVICAL DECOMP/DISCECTOMY FUSION N/A 03/30/2018   Procedure: Cervical six-seven Anterior cervical decompression/discectomy/fusion;  Surgeon: OstJudith PartD;  [facility];  Laterality: N/A;   ANTERIOR CERVICAL DECOMP/DISCECTOMY FUSION N/A 01/11/2019   Procedure: Cervical Five-Six Anterior cervical discectomy fusion,  Cervical Five to Cervical Seven anterior instrumented fusion;  Surgeon: OstJudith PartD;  [facility];  Laterality: N/A;  Cervical Five-Six Anterior cervical discectomy fusion,  Cervical Five to Cervical Seven anterior instrumented fusion   BACK SURGERY     CARDIAC CATHETERIZATION Left 02/22/2016   Procedure: Left Heart Cath and Coronary Angiography;  Surgeon: DwaYolonda KidaD;  [place] [facility];  Service: Cardiovascular;  Laterality: Left;   CATARACT EXTRACTION W/ INTRAOCULAR LENS IMPLANT Right    COLONOSCOPY WITH PROPOFOL N/A 09/19/2016   Procedure: COLONOSCOPY WITH PROPOFOL;  Surgeon: KirJonathon BellowsD;  Location: [facility];  Service: Endoscopy;  Laterality: N/A;   DIAGNOSTIC LAPAROSCOPY     ESOPHAGOGASTRODUODENOSCOPY (EGD) WITH PROPOFOL N/A 09/18/2017   Procedure: ESOPHAGOGASTRODUODENOSCOPY (EGD) WITH PROPOFOL;  Surgeon: AnnJonathon BellowsD;  [facility];  Service: Gastroenterology;  Laterality: N/A;   ESOPHAGOGASTRODUODENOSCOPY (EGD) WITH PROPOFOL N/A 08/10/2019   Procedure: ESOPHAGOGASTRODUODENOSCOPY (EGD) WITH PROPOFOL;  Surgeon: AnnJonathon BellowsD;  [facility];  Service: Endoscopy;  Laterality: N/A;   EYE SURGERY     R eye - cataract   FRACTURE SURGERY     hemorroids     NASAL SINUS SURGERY     ORIF FEMUR FRACTURE     ORIF TIBIA & FIBULA FRACTURES     OVARY SURGERY     POSTERIOR CERVICAL FUSION/FORAMINOTOMY N/A 05/14/2021   Procedure: Cervical five, Cervical six Laminectomy,foraminotomy with  Cervical five-six Cervical six-seven Cervical seven-Thoracic one Posterior instrumented fusion;  Surgeon: Judith Part, MD;  Location: [place];  Service: Neurosurgery;  Laterality: N/A;   TRANSFORAMINAL LUMBAR INTERBODY FUSION W/ MIS 1 LEVEL Right 05/23/2020   Procedure: Right Lumbar Five Sacral One Minimally invasive transforaminal lumbar interbody fusion;  Surgeon: Judith Part, MD;  Location: [place];  Service: Neurosurgery;  Laterality: Right;  Right Lumbar Five Sacral One Minimally invasive transforaminal lumbar interbody fusion   Social History   Socioeconomic History   Marital status: Widowed    Spouse name: Not on file   Number of children: 0   Years of education: Not on file   Highest education level: 9th grade  Occupational History   Occupation: disabled  Tobacco Use   Smoking status: Every Day    Packs/day: 0.50    Years: 35.00    Total pack years: 17.50    Types: Cigarettes   Smokeless tobacco: Never   Tobacco comments:    07/2019- 1.5 a day  Vaping Use   Vaping Use: Former  Substance and Sexual Activity   Alcohol use: Not Currently    Alcohol/week: 0.0 standard drinks of alcohol   Drug use: Not Currently    Types: Marijuana   Sexual activity: Yes    Birth control/protection: None  Other Topics Concern   Not on file  Social History Narrative   Moved from [place].   Social Determinants of Health   Financial Resource Strain: Medium Risk (09/17/2021)   Overall Financial Resource Strain (CARDIA)    Difficulty of Paying Living Expenses: Somewhat hard  Food Insecurity: No Food Insecurity (01/30/2022)   Hunger Vital Sign    Worried About Running Out of Food in the Last Year: Never true    [place] in the Last Year: Never true  Transportation Needs: No Transportation Needs (09/17/2021)   PRAPARE - Hydrologist (Medical): No    Lack of Transportation (Non-Medical): No  Physical Activity: Inactive (12/24/2021)   Exercise  Vital Sign    Days of Exercise per Week: 0 days    Minutes of Exercise per Session: 0 min  Stress: Stress Concern Present (10/23/2021)   [place]    Feeling of Stress : Very much  Social Connections: Socially Isolated (11/23/2021)   Social Connection and Isolation Panel [NHANES]    Frequency of Communication with Friends and Family: More than three times a week    Frequency of Social Gatherings with Friends and Family: More than three times a week    Attends Religious Services: Never    Marine scientist or Organizations: No    Attends Archivist Meetings: Never    Marital Status: Widowed  Intimate Partner Violence: Not At Risk (01/30/2022)   Humiliation, Afraid, Rape, and Kick questionnaire    Fear of Current or Ex-Partner: No    Emotionally Abused: No    Physically Abused: No    Sexually Abused: No   Family Status  Relation Name Status   Mother  Deceased   Father  Deceased       MVA   Brother  Alive   MGM  Deceased   MGF  Deceased   PGM  Deceased   PGF  Deceased   Brother  Alive   Brother  Alive   Mat Uncle  Deceased   Mat Aunt  (Not Specified)   Family History  Problem Relation Age of Onset   Heart disease Mother    Lung cancer Mother    Ovarian cancer Mother    Healthy Brother    Healthy Brother    Healthy Brother    Diabetes Maternal Uncle    Breast cancer Maternal Aunt    Allergies  Allergen Reactions   Cyclobenzaprine Hives, Swelling and Rash    Facial/lip swelling      Levofloxacin Hives, Swelling and Rash   Chlorpheniramine Maleate     Other reaction(s): Unknown   Phenergan [Promethazine Hcl] Other (See Comments)    Agitation.    Phenylephrine Hcl Other (See Comments)   Toradol [Ketorolac Tromethamine] Swelling and Other (See Comments)    Facial/tongue swelling    Zoloft [Sertraline Hcl] Swelling    Tongue swelling      Cephalexin Hives and Rash   Ketorolac Rash    Tramadol Hives, Swelling, Other (See Comments) and Rash    Lip swelling     Patient Care Team: Sara Foster, MD as PCP - General ([facility]) [facility], [facility], Lorel Monaco, RN as Case Manager Greg Cutter, LCSW as [employer] (Licensed Holiday representative)   Medications: Outpatient Medications Prior to Visit  Medication Sig   albuterol (PROVENTIL) (2.5 MG/3ML) 0.083% nebulizer solution Take 3 mLs (2.5 mg total) by nebulization every 6 (six) hours as needed for wheezing or shortness of breath.   albuterol (VENTOLIN HFA) 108 (90 Base) MCG/ACT inhaler INHALE TWO PUFFS BY MOUTH INTO LUNGS EVERY 6 HOURS AS NEEDED FOR SHORTNESS OF BREATH OR wheezing   amLODipine (NORVASC) 5 MG tablet Take 1 tablet (5 mg total) by mouth daily.   atorvastatin (LIPITOR) 20 MG tablet Take 1 tablet (20 mg total) by mouth every evening.   docusate sodium (COLACE) 100 MG capsule Take 100 mg by mouth 2 (two) times daily.   gabapentin (NEURONTIN) 600 MG tablet Take 600 mg by mouth 4 (four) times daily.   lactulose (CHRONULAC) 10 GM/15ML solution Take 30 mLs by mouth in the morning, at noon, in the evening, and at bedtime.   LINZESS 290 MCG CAPS capsule Take 290 mcg by mouth every morning.   lisinopril (ZESTRIL) 40 MG tablet Take 1 tablet (40 mg total) by mouth every evening.   metoprolol tartrate (LOPRESSOR) 25 MG tablet Take 1 tablet (25 mg total) by mouth 2 (two) times daily.   mometasone-formoterol (DULERA) 100-5 MCG/ACT AERO Inhale 2 puffs into the lungs daily.   naloxone (NARCAN) 4 MG/0.1ML LIQD nasal spray kit Place 1 spray into the nose as needed (opioid overdose).   Naloxone HCl (NARCAN IJ) Inject 1 Dose as directed as needed (opioid overdose).   naproxen (NAPROSYN) 500 MG tablet Take 500 mg by mouth 2 (two) times daily.   nicotine (NICODERM CQ - DOSED IN MG/24 HR) 7 mg/24hr patch Place 1 patch (7 mg total) onto the skin daily.   omeprazole  (PRILOSEC) 40 MG capsule TAKE 1 CAPSULE BY MOUTH TWICE A DAY   ondansetron (ZOFRAN) 8 MG tablet Take  1 tablet (8 mg total) by mouth every 8 (eight) hours as needed for nausea or vomiting.   oxyCODONE (ROXICODONE) 15 MG immediate release tablet Take 15 mg by mouth.   sucralfate (CARAFATE) 1 GM/10ML suspension TAKE 10 MLS (1 G TOTAL) BY MOUTH 4 (FOUR) TIMES DAILY.   SUMAtriptan (IMITREX) 25 MG tablet Take 1 tablet by mouth as needed.   Tiotropium Bromide Monohydrate (SPIRIVA RESPIMAT) 2.5 MCG/ACT AERS INHALE 1 PUFF BY MOUTH INTO LUNGS ONCE DAILY   No facility-administered medications prior to visit.    Review of Systems  HENT:  Positive for tinnitus.   Respiratory:  Positive for wheezing.   Gastrointestinal:  Positive for abdominal pain and nausea.  Musculoskeletal:  Positive for arthralgias, back pain, myalgias, neck pain and neck stiffness.  Neurological:  Positive for weakness, numbness and headaches.  All other systems reviewed and are negative.     Objective    BP 108/68 (BP Location: Left Arm, Patient Position: Sitting, Cuff Size: Large)   Pulse (!) 53   Temp 97.9 F (36.6 C) (Oral)   Resp 16   Ht _0  (1.626 m)   Wt 179 lb 12.8 oz (81.6 kg)   LMP 12/16/2017 (Approximate)   BMI 30.86 kg/m     Physical Exam Vitals reviewed.  Constitutional:      General: She is not in acute distress.    Appearance: Normal appearance. She is not ill-appearing, toxic-appearing or diaphoretic.  HENT:     Head: Normocephalic and atraumatic.     Right Ear: Tympanic membrane and external ear normal. There is no impacted cerumen.     Left Ear: Tympanic membrane and external ear normal. There is no impacted cerumen.     Nose: Nose normal.     Mouth/Throat:     Pharynx: Oropharynx is clear.  Eyes:     General: No scleral icterus.    Extraocular Movements: Extraocular movements intact.     Conjunctiva/sclera: Conjunctivae normal.     Pupils: Pupils are equal, round, and reactive to light.   Cardiovascular:     Rate and Rhythm: Normal rate. Rhythm irregular.     Pulses: Normal pulses.     Heart sounds: Murmur heard.     No friction rub. No gallop.  Pulmonary:     Effort: Pulmonary effort is normal. No respiratory distress.     Breath sounds: Rhonchi present. No wheezing or rales.  Abdominal:     General: Bowel sounds are normal. There is no distension.     Palpations: Abdomen is soft. There is no mass.     Tenderness: There is abdominal tenderness in the right upper quadrant, right lower quadrant, epigastric area and left lower quadrant. There is no guarding.  Musculoskeletal:        General: No deformity.     Cervical back: Normal range of motion and neck supple. No rigidity.     Right lower leg: No edema.     Left lower leg: No edema.  Lymphadenopathy:     Cervical: No cervical adenopathy.  Skin:    General: Skin is warm.     Capillary Refill: Capillary refill takes less than 2 seconds.     Findings: No erythema or rash.  Neurological:     General: No focal deficit present.     Mental Status: She is alert and oriented to person, place, and time.     Motor: No weakness.     Gait: Gait normal.  Psychiatric:  Mood and Affect: Mood normal.        Behavior: Behavior normal.      Last depression screening scores    04/03/2022    1:51 PM 10/23/2021    2:26 PM 06/01/2021    3:49 PM  PHQ 2/9 Scores  PHQ - 2 Score 0 0 0  PHQ- 9 Score   0   Last fall risk screening    04/03/2022    1:51 PM  Fall Risk   Falls in the past year? 1  Number falls in past yr: 0  Injury with Fall? 1  Follow up Falls evaluation completed   Last Audit-C alcohol use screening    04/03/2022    1:51 PM  Alcohol Use Disorder Test (AUDIT)  1. How often do you have a drink containing alcohol? 0  2. How many drinks containing alcohol do you have on a typical day when you are drinking? 0  3. How often do you have six or more drinks on one occasion? 0  AUDIT-C Score 0   A score  of 3 or more in women, and 4 or more in men indicates increased risk for alcohol abuse, EXCEPT if all of the points are from question 1   Results for orders placed or performed in visit on 04/03/22  Comprehensive metabolic panel  Result Value Ref Range   Glucose 84 70 - 99 mg/dL   BUN 11 6 - 24 mg/dL   Creatinine, Ser 0.75 0.57 - 1.00 mg/dL   eGFR 95 >59 mL/min/1.73   BUN/Creatinine Ratio 15 9 - 23   Sodium 138 134 - 144 mmol/L   Potassium 5.4 (H) 3.5 - 5.2 mmol/L   Chloride 99 96 - 106 mmol/L   CO2 23 20 - 29 mmol/L   Calcium 9.5 8.7 - 10.2 mg/dL   Total Protein 7.1 6.0 - 8.5 g/dL   Albumin 4.4 3.8 - 4.9 g/dL   Globulin, Total 2.7 1.5 - 4.5 g/dL   Albumin/Globulin Ratio 1.6 1.2 - 2.2   Bilirubin Total <0.2 0.0 - 1.2 mg/dL   Alkaline Phosphatase 112 44 - 121 IU/L   AST 16 0 - 40 IU/L   ALT 10 0 - 32 IU/L  Hemoglobin A1c  Result Value Ref Range   Hgb A1c MFr Bld 5.8 (H) 4.8 - 5.6 %   Est. average glucose Bld gHb Est-mCnc 120 mg/dL  CBC  Result Value Ref Range   WBC 7.2 3.4 - 10.8 x10E3/uL   RBC 5.25 3.77 - 5.28 x10E6/uL   Hemoglobin 12.0 11.1 - 15.9 g/dL   Hematocrit 39.8 34.0 - 46.6 %   MCV 76 (L) 79 - 97 fL   MCH 22.9 (L) 26.6 - 33.0 pg   MCHC 30.2 (L) 31.5 - 35.7 g/dL   RDW 18.3 (H) 11.7 - 15.4 %   Platelets 269 150 - 450 x10E3/uL  TSH + free T4  Result Value Ref Range   TSH 2.910 0.450 - 4.500 uIU/mL   Free T4 1.07 0.82 - 1.77 ng/dL  Lipid panel  Result Value Ref Range   Cholesterol, Total 268 (H) 100 - 199 mg/dL   Triglycerides 268 (H) 0 - 149 mg/dL   HDL 37 (L) >39 mg/dL   VLDL Cholesterol Cal 52 (H) 5 - 40 mg/dL   LDL Chol Calc (NIH) 179 (H) 0 - 99 mg/dL   Chol/HDL Ratio 7.2 (H) 0.0 - 4.4 ratio  Lipase  Result Value Ref Range   Lipase 29  14 - 72 U/L    Assessment & Plan    Routine Health Maintenance and Physical Exam  Exercise Activities and Dietary recommendations    Immunization History  Administered Date(s) Administered   Hepatitis A, Adult  06/26/2017, 01/15/2018   Hepatitis B, adult 06/26/2017, 07/29/2017, 12/10/2017, 06/23/2018    Health Maintenance  Topic Date Due   COVID-19 Vaccine (1) Never done   Zoster Vaccines- Shingrix (1 of 2) Never done   MAMMOGRAM  07/05/2019   COLONOSCOPY (Pts 45-96yr Insurance coverage will need to be confirmed)  09/19/2021   INFLUENZA VACCINE  09/22/2022 (Originally 01/22/2022)   TETANUS/TDAP  03/12/2023 (Originally [birth year]/1987)   PAP SMEAR-Modifier  07/29/2022   Hepatitis C Screening  Completed   HIV Screening  Completed   HPV VACCINES  Aged Out    Discussed health benefits of physical activity, and encouraged her to engage in regular exercise appropriate for her age and condition.  Problem List Items Addressed This Visit       Cardiovascular and Mediastinum   Essential hypertension - Primary    Chronic, controlled She will continue her current medications  Collect CMP today        Relevant Orders   Comprehensive metabolic panel (Completed)   CBC (Completed)   TSH + free T4 (Completed)     Respiratory   Obstructive sleep apnea   Relevant Orders   CBC (Completed)     Other   Hyperlipidemia    Chronic, stable  We will collect lipid panel today  She will continue statin therapy including atorvastatin 274mdaily        Relevant Orders   CBC (Completed)   Lipid panel (Completed)   Anemia of chronic disease    Cbc collected today       Body mass index (BMI) 32.0-32.9, adult    BMI 30.86  CMP, lipid panel, A1c, TSH      Relevant Orders   Comprehensive metabolic panel (Completed)   Hemoglobin A1c (Completed)   CBC (Completed)   TSH + free T4 (Completed)   Encounter for annual physical examination excluding gynecological examination in a patient older than 17 years    Mammogram ordered today  She will need to reschedule colonoscopy  A1c collected today  CMP,lipid panel, TSH collected today        Relevant Orders   CBC (Completed)   Other Visit Diagnoses      Encounter for screening mammogram for malignant neoplasm of breast       Relevant Orders   MM 3D SCREEN BREAST BILATERAL   Screening for cervical cancer       Relevant Orders   Ambulatory referral to Gynecology   History of anemia       Relevant Orders   CBC (Completed)   Epigastric pain       Relevant Orders   Lipase        Return in about 3 months (around 07/04/2022) for chronic conditions.     I, Sara FosterMD, have reviewed all documentation for this visit. The documentation on 04/07/22 for the exam, diagnosis, procedures, and orders are all accurate and complete.    Sara FosterMD  [phone]) [phone])  [place]

## 2022-04-03 ENCOUNTER — Encounter: Payer: Self-pay | Admitting: Family Medicine

## 2022-04-03 ENCOUNTER — Ambulatory Visit: Payer: Medicaid Other | Admitting: Family Medicine

## 2022-04-03 VITALS — BP 108/68 | HR 53 | Temp 97.9°F | Resp 16 | Ht 64.0 in | Wt 179.8 lb

## 2022-04-03 DIAGNOSIS — Z0000 Encounter for general adult medical examination without abnormal findings: Secondary | ICD-10-CM

## 2022-04-03 DIAGNOSIS — Z6832 Body mass index (BMI) 32.0-32.9, adult: Secondary | ICD-10-CM | POA: Diagnosis not present

## 2022-04-03 DIAGNOSIS — Z124 Encounter for screening for malignant neoplasm of cervix: Secondary | ICD-10-CM | POA: Diagnosis not present

## 2022-04-03 DIAGNOSIS — D638 Anemia in other chronic diseases classified elsewhere: Secondary | ICD-10-CM | POA: Diagnosis not present

## 2022-04-03 DIAGNOSIS — R1013 Epigastric pain: Secondary | ICD-10-CM

## 2022-04-03 DIAGNOSIS — I10 Essential (primary) hypertension: Secondary | ICD-10-CM | POA: Diagnosis not present

## 2022-04-03 DIAGNOSIS — G4733 Obstructive sleep apnea (adult) (pediatric): Secondary | ICD-10-CM | POA: Diagnosis not present

## 2022-04-03 DIAGNOSIS — Z1231 Encounter for screening mammogram for malignant neoplasm of breast: Secondary | ICD-10-CM

## 2022-04-03 DIAGNOSIS — Z862 Personal history of diseases of the blood and blood-forming organs and certain disorders involving the immune mechanism: Secondary | ICD-10-CM

## 2022-04-03 DIAGNOSIS — E782 Mixed hyperlipidemia: Secondary | ICD-10-CM

## 2022-04-03 NOTE — Patient Instructions (Signed)
Zoster Vaccine, Recombinant injection What is this medication? ZOSTER VACCINE (ZOS ter vak SEEN) is a vaccine used to reduce the risk of getting shingles. This vaccine is not used to treat shingles or nerve pain from shingles. This medicine may be used for other purposes; ask your health care provider or pharmacist if you have questions. COMMON BRAND NAME(S): [facility] What should I tell my care team before I take this medication? They need to know if you have any of these conditions: cancer immune system problems an unusual or allergic reaction to Zoster vaccine, other medications, foods, dyes, or preservatives pregnant or trying to get pregnant breast-feeding How should I use this medication? This vaccine is injected into a muscle. It is given by a health care provider. A copy of Vaccine Information Statements will be given before each vaccination. Be sure to read this information carefully each time. This sheet may change often. Talk to your health care provider about the use of this vaccine in children. This vaccine is not approved for use in children. Overdosage: If you think you have taken too much of this medicine contact a poison control center or emergency room at once. NOTE: This medicine is only for you. Do not share this medicine with others. What if I miss a dose? Keep appointments for follow-up (booster) doses. It is important not to miss your dose. Call your health care provider if you are unable to keep an appointment. What may interact with this medication? medicines that suppress your immune system medicines to treat cancer steroid medicines like prednisone or cortisone This list may not describe all possible interactions. Give your health care provider a list of all the medicines, herbs, non-prescription drugs, or dietary supplements you use. Also tell them if you smoke, drink alcohol, or use illegal drugs. Some items may interact with your medicine. What should I watch for  while using this medication? Visit your health care provider regularly. This vaccine, like all vaccines, may not fully protect everyone. What side effects may I notice from receiving this medication? Side effects that you should report to your doctor or health care professional as soon as possible: allergic reactions (skin rash, itching or hives; swelling of the face, lips, or tongue) trouble breathing Side effects that usually do not require medical attention (report these to your doctor or health care professional if they continue or are bothersome): chills headache fever nausea pain, redness, or irritation at site where injected tiredness vomiting This list may not describe all possible side effects. Call your doctor for medical advice about side effects. You may report side effects to FDA at 1-800-FDA-1088. Where should I keep my medication? This vaccine is only given by a health care provider. It will not be stored at home. NOTE: This sheet is a summary. It may not cover all possible information. If you have questions about this medicine, talk to your doctor, pharmacist, or health care provider.  2023 Elsevier/Gold Standard (2021-05-11 00:00:00)

## 2022-04-04 ENCOUNTER — Encounter: Admission: RE | Payer: Self-pay | Source: Home / Self Care

## 2022-04-04 ENCOUNTER — Ambulatory Visit: Admission: RE | Admit: 2022-04-04 | Payer: Medicaid Other | Source: Home / Self Care | Admitting: Gastroenterology

## 2022-04-04 ENCOUNTER — Other Ambulatory Visit: Payer: Self-pay

## 2022-04-04 DIAGNOSIS — E875 Hyperkalemia: Secondary | ICD-10-CM

## 2022-04-04 LAB — TSH+FREE T4
Free T4: 1.07 ng/dL (ref 0.82–1.77)
TSH: 2.91 u[IU]/mL (ref 0.450–4.500)

## 2022-04-04 LAB — HEMOGLOBIN A1C
Est. average glucose Bld gHb Est-mCnc: 120 mg/dL
Hgb A1c MFr Bld: 5.8 % — ABNORMAL HIGH (ref 4.8–5.6)

## 2022-04-04 LAB — COMPREHENSIVE METABOLIC PANEL
ALT: 10 IU/L (ref 0–32)
AST: 16 IU/L (ref 0–40)
Albumin/Globulin Ratio: 1.6 (ref 1.2–2.2)
Albumin: 4.4 g/dL (ref 3.8–4.9)
Alkaline Phosphatase: 112 IU/L (ref 44–121)
BUN/Creatinine Ratio: 15 (ref 9–23)
BUN: 11 mg/dL (ref 6–24)
Bilirubin Total: <0.2 mg/dL (ref 0.0–1.2)
CO2: 23 mmol/L (ref 20–29)
Calcium: 9.5 mg/dL (ref 8.7–10.2)
Chloride: 99 mmol/L (ref 96–106)
Creatinine, Ser: 0.75 mg/dL (ref 0.57–1.00)
Globulin, Total: 2.7 g/dL (ref 1.5–4.5)
Glucose: 84 mg/dL (ref 70–99)
Potassium: 5.4 mmol/L — ABNORMAL HIGH (ref 3.5–5.2)
Sodium: 138 mmol/L (ref 134–144)
Total Protein: 7.1 g/dL (ref 6.0–8.5)
eGFR: 95 mL/min/{1.73_m2} (ref >59)

## 2022-04-04 LAB — LIPID PANEL
Chol/HDL Ratio: 7.2 ratio — ABNORMAL HIGH (ref 0.0–4.4)
Cholesterol, Total: 268 mg/dL — ABNORMAL HIGH (ref 100–199)
HDL: 37 mg/dL — ABNORMAL LOW (ref >39)
LDL Chol Calc (NIH): 179 mg/dL — ABNORMAL HIGH (ref 0–99)
Triglycerides: 268 mg/dL — ABNORMAL HIGH (ref 0–149)
VLDL Cholesterol Cal: 52 mg/dL — ABNORMAL HIGH (ref 5–40)

## 2022-04-04 LAB — LIPASE: Lipase: 29 U/L (ref 14–72)

## 2022-04-04 LAB — CBC
Hematocrit: 39.8 % (ref 34.0–46.6)
Hemoglobin: 12 g/dL (ref 11.1–15.9)
MCH: 22.9 pg — ABNORMAL LOW (ref 26.6–33.0)
MCHC: 30.2 g/dL — ABNORMAL LOW (ref 31.5–35.7)
MCV: 76 fL — ABNORMAL LOW (ref 79–97)
Platelets: 269 10*3/uL (ref 150–450)
RBC: 5.25 x10E6/uL (ref 3.77–5.28)
RDW: 18.3 % — ABNORMAL HIGH (ref 11.7–15.4)
WBC: 7.2 10*3/uL (ref 3.4–10.8)

## 2022-04-04 SURGERY — COLONOSCOPY WITH PROPOFOL
Anesthesia: General

## 2022-04-07 NOTE — Assessment & Plan Note (Signed)
Cbc collected today

## 2022-04-07 NOTE — Assessment & Plan Note (Signed)
Chronic, controlled She will continue her current medications  Collect CMP today

## 2022-04-07 NOTE — Assessment & Plan Note (Signed)
Mammogram ordered today  She will need to reschedule colonoscopy  A1c collected today  CMP,lipid panel, TSH collected today

## 2022-04-07 NOTE — Assessment & Plan Note (Signed)
BMI 30.86  CMP, lipid panel, A1c, TSH

## 2022-04-07 NOTE — Assessment & Plan Note (Signed)
Chronic, stable  We will collect lipid panel today  She will continue statin therapy including atorvastatin '20mg'$  daily

## 2022-04-08 ENCOUNTER — Telehealth: Payer: Self-pay | Admitting: Neurology

## 2022-04-08 DIAGNOSIS — E875 Hyperkalemia: Secondary | ICD-10-CM | POA: Diagnosis not present

## 2022-04-08 NOTE — Telephone Encounter (Signed)
LVM and sent mycahrt msg informing pt of appointment change from MD to NP due to MD needing to do an urgent nerve conduction study

## 2022-04-09 ENCOUNTER — Other Ambulatory Visit: Payer: Self-pay | Admitting: Family Medicine

## 2022-04-09 ENCOUNTER — Ambulatory Visit: Payer: Medicaid Other | Admitting: Neurology

## 2022-04-09 ENCOUNTER — Encounter: Payer: Self-pay | Admitting: Adult Health

## 2022-04-09 ENCOUNTER — Ambulatory Visit (INDEPENDENT_AMBULATORY_CARE_PROVIDER_SITE_OTHER): Payer: Medicaid Other | Admitting: Adult Health

## 2022-04-09 VITALS — BP 141/76 | HR 56 | Ht 64.0 in | Wt 181.0 lb

## 2022-04-09 DIAGNOSIS — G43709 Chronic migraine without aura, not intractable, without status migrainosus: Secondary | ICD-10-CM

## 2022-04-09 DIAGNOSIS — E875 Hyperkalemia: Secondary | ICD-10-CM

## 2022-04-09 DIAGNOSIS — G5603 Carpal tunnel syndrome, bilateral upper limbs: Secondary | ICD-10-CM | POA: Diagnosis not present

## 2022-04-09 LAB — BASIC METABOLIC PANEL
BUN/Creatinine Ratio: 12 (ref 9–23)
BUN: 10 mg/dL (ref 6–24)
CO2: 23 mmol/L (ref 20–29)
Calcium: 9.5 mg/dL (ref 8.7–10.2)
Chloride: 97 mmol/L (ref 96–106)
Creatinine, Ser: 0.84 mg/dL (ref 0.57–1.00)
Glucose: 87 mg/dL (ref 70–99)
Potassium: 5.3 mmol/L — ABNORMAL HIGH (ref 3.5–5.2)
Sodium: 135 mmol/L (ref 134–144)
eGFR: 83 mL/min/{1.73_m2} (ref >59)

## 2022-04-09 MED ORDER — LOKELMA 5 G PO PACK: 10.0000 g | PACK | Freq: Once | ORAL | 0 refills | Status: AC

## 2022-04-09 MED ORDER — AIMOVIG 140 MG/ML ~~LOC~~ SOAJ: 140.0000 mg | SUBCUTANEOUS | 5 refills | Status: DC

## 2022-04-09 NOTE — Progress Notes (Addendum)
[facility] [address] [place]. [place] [address] [phone]       OFFICE FOLLOW UP NOTE  [name] Date of Birth:  08-27-1965 Medical Record Number:  [id number]    Primary neurologist: Dr. Krista Blue Reason for visit: Neck pain, headaches    SUBJECTIVE:   CHIEF COMPLAINT:  Chief Complaint  Patient presents with   Follow-up    RM 2 alone Pt is  well, states she still having headaches. Having a numbness,tingling pain on L side of head      HPI:   Sara Munoz is a 56 year old female, seen in request by Dr. Virginia Crews, for evaluation of neck pain, frequent headaches on 07/09/2021 with Dr. Krista Blue   I reviewed and summarized the referring note. PMHx HTN HLD Chronic neck pain, under [facility] pain management, taking oxycodone 97m x5, gabapentin [id number]. Smoke 1/2 ppd x40. CAD, Lumbar decompression surgery.   Initial visit 07/09/2021 Dr. YaKrista BlueI saw her previously in 2019, reported more than 1 year history of worsening neck pain, radiating paresthesias throughout her spine and 4 extremity with neck movement, MRI of cervical spine showed central disc protrusion at C6-7, with mild to moderate canal stenosis, cord flattening, canal diameter was 6 mm, but no cord signal abnormality MRI of the brain showed no acute abnormality   EMG nerve conduction study in April 2019 showed mild right carpal tunnel syndrome  She complains of worsening neck pain radiating pain to bilateral shoulder, right hands paresthesia, despite titrating dose of gabapentin, also under pain management oxycodone,  She eventually underwent laminectomy C5-6, C6-7, C7-T1 with posterior instrumented fusion in November 2022 by Dr. OsZada Finders  Surgery did help her neck pain some, but she still has frequent headaches, daily 7 out of 10, 2-3 times each week, it would exacerbate, much severe pain, spreading from abdominal, occipital region forward, severe pain with light noise  sensitivity, she has been taking aspirin 2-3 times a day to alleviate the symptoms   She denies significant gait abnormality, no bowel bladder incontinence   In addition, she complains of fatigue, sleepiness, mild snoring, has very narrow oropharyngeal space,    Update 04/09/2022 JM: Patient returns for follow-up visit after prior initial visit with Dr. YaKrista Blue months ago.  She is unaccompanied.  She continues to experience frequent headaches similar to prior visit.  Did not start Aimovig due to insurance, she reports her insurance would not cover but was approved back in April. She was agreeable to start this medication.   Reports chronic left side of head numbness/tingling and can be painful since her laminectomy 1 year ago. Reports numbness sensation that starts in the back of her head and can radiate up towards the left side and then can experience painful sensation, can last for long duration at times. No specific triggers, can happen even when sitting still.  She has a follow-up visit with neurosurgery this week and plans on discussing further.  She also has a follow-up with pain management this Thursday.  EMG/NCV showed bilateral syndrome, moderate on right, mild on left  She does report at night, she will have a hard time keeping her legs from moving. Denies painful sensation or feeling as though she needs to move her legs. This is not new.   [facility] but did not show for initial evaluation on 3/28 with Dr. AtRexene AlbertsShe is agreeable to have this rescheduled.       ROS:  14 system review of systems performed and negative with exception of those listed in HPI  PMH:  Past Medical History:  Diagnosis Date   Anemia    Anxiety    Asthma    Atypical chest pain 08/02/2015   CAD (coronary artery disease)    Chronic back pain    COPD (chronic obstructive pulmonary disease) (HCC)    Coronary artery disease    a. Mild-non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/2014    GERD (gastroesophageal reflux disease)    Hepatitis C    per patient, "took medicines for it and no longer has it"   Hypercholesteremia    Hypertension    Hypokalemia    Hypomagnesemia 01/04/2014   Liver disease    MI (myocardial infarction) ([place])    Nerve pain    per patient, in the lower back   Opiate use 02/27/2016   Osteoarthritis    Pneumonia    Stroke [facility])     PSH:  Past Surgical History:  Procedure Laterality Date   ANTERIOR CERVICAL DECOMP/DISCECTOMY FUSION N/A 03/30/2018   Procedure: Cervical six-seven Anterior cervical decompression/discectomy/fusion;  Surgeon: Judith Part, MD;  Location: [facility];  Service: Neurosurgery;  Laterality: N/A;   ANTERIOR CERVICAL DECOMP/DISCECTOMY FUSION N/A 01/11/2019   Procedure: Cervical Five-Six Anterior cervical discectomy fusion,  Cervical Five to Cervical Seven anterior instrumented fusion;  Surgeon: Judith Part, MD;  Location: [facility];  Service: Neurosurgery;  Laterality: N/A;  Cervical Five-Six Anterior cervical discectomy fusion,  Cervical Five to Cervical Seven anterior instrumented fusion   BACK SURGERY     CARDIAC CATHETERIZATION Left 02/22/2016   Procedure: Left Heart Cath and Coronary Angiography;  Surgeon: Yolonda Kida, MD;  Location: [facility];  Service: Cardiovascular;  Laterality: Left;   CATARACT EXTRACTION W/ INTRAOCULAR LENS IMPLANT Right    COLONOSCOPY WITH PROPOFOL N/A 09/19/2016   Procedure: COLONOSCOPY WITH PROPOFOL;  Surgeon: [name], MD;  Location: [facility] ENDOSCOPY;  Service: Endoscopy;  Laterality: N/A;   DIAGNOSTIC LAPAROSCOPY     ESOPHAGOGASTRODUODENOSCOPY (EGD) WITH PROPOFOL N/A 09/18/2017   Procedure: ESOPHAGOGASTRODUODENOSCOPY (EGD) WITH PROPOFOL;  Surgeon: [name], MD;  Location: [facility] ENDOSCOPY;  Service: Gastroenterology;  Laterality: N/A;   ESOPHAGOGASTRODUODENOSCOPY (EGD) WITH PROPOFOL N/A 08/10/2019   Procedure: ESOPHAGOGASTRODUODENOSCOPY (EGD) WITH PROPOFOL;  Surgeon:  [name], MD;  Location: [facility] ENDOSCOPY;  Service: Endoscopy;  Laterality: N/A;   EYE SURGERY     R eye - cataract   FRACTURE SURGERY     hemorroids     NASAL SINUS SURGERY     ORIF FEMUR FRACTURE     ORIF TIBIA & FIBULA FRACTURES     OVARY SURGERY     POSTERIOR CERVICAL FUSION/FORAMINOTOMY N/A 05/14/2021   Procedure: Cervical five, Cervical six Laminectomy,foraminotomy with Cervical five-six Cervical six-seven Cervical seven-Thoracic one Posterior instrumented fusion;  Surgeon: Judith Part, MD;  Location: [facility];  Service: Neurosurgery;  Laterality: N/A;   TRANSFORAMINAL LUMBAR INTERBODY FUSION W/ MIS 1 LEVEL Right 05/23/2020   Procedure: Right Lumbar Five Sacral One Minimally invasive transforaminal lumbar interbody fusion;  Surgeon: Judith Part, MD;  Location: [facility];  Service: Neurosurgery;  Laterality: Right;  Right Lumbar Five Sacral One Minimally invasive transforaminal lumbar interbody fusion    Social History:  Social History   Socioeconomic History   Marital status: Widowed    Spouse name: Not on file   Number of children: 0   Years of education: Not on file   Highest  education level: 9th grade  Occupational History   Occupation: disabled  Tobacco Use   Smoking status: Every Day    Packs/day: 0.50    Years: 35.00    Total pack years: 17.50    Types: Cigarettes   Smokeless tobacco: Never   Tobacco comments:    07/2019- 1.5 a day  Vaping Use   Vaping Use: Former  Substance and Sexual Activity   Alcohol use: Not Currently    Alcohol/week: 0.0 standard drinks of alcohol   Drug use: Not Currently    Types: Marijuana   Sexual activity: Yes    Birth control/protection: None  Other Topics Concern   Not on file  Social History Narrative   Moved from [place].   Social Determinants of Health   Financial Resource Strain: Medium Risk (09/17/2021)   Overall Financial Resource Strain (CARDIA)    Difficulty of Paying Living Expenses: Somewhat hard   Food Insecurity: No Food Insecurity (01/30/2022)   Hunger Vital Sign    Worried About Running Out of Food in the Last Year: Never true    [place] in the Last Year: Never true  Transportation Needs: No Transportation Needs (09/17/2021)   PRAPARE - Hydrologist (Medical): No    Lack of Transportation (Non-Medical): No  Physical Activity: Inactive (12/24/2021)   Exercise Vital Sign    Days of Exercise per Week: 0 days    Minutes of Exercise per Session: 0 min  Stress: Stress Concern Present (10/23/2021)   [place]    Feeling of Stress : Very much  Social Connections: Socially Isolated (11/23/2021)   Social Connection and Isolation Panel [NHANES]    Frequency of Communication with Friends and Family: More than three times a week    Frequency of Social Gatherings with Friends and Family: More than three times a week    Attends Religious Services: Never    Marine scientist or Organizations: No    Attends Archivist Meetings: Never    Marital Status: Widowed  Intimate Partner Violence: Not At Risk (01/30/2022)   Humiliation, Afraid, Rape, and Kick questionnaire    Fear of Current or Ex-Partner: No    Emotionally Abused: No    Physically Abused: No    Sexually Abused: No    Family History:  Family History  Problem Relation Age of Onset   Heart disease Mother    Lung cancer Mother    Ovarian cancer Mother    Healthy Brother    Healthy Brother    Healthy Brother    Diabetes Maternal Uncle    Breast cancer Maternal Aunt     Medications:   Current Outpatient Medications on File Prior to Visit  Medication Sig Dispense Refill   albuterol (PROVENTIL) (2.5 MG/3ML) 0.083% nebulizer solution Take 3 mLs (2.5 mg total) by nebulization every 6 (six) hours as needed for wheezing or shortness of breath. 75 mL 12   albuterol (VENTOLIN HFA) 108 (90 Base) MCG/ACT inhaler INHALE TWO  PUFFS BY MOUTH INTO LUNGS EVERY 6 HOURS AS NEEDED FOR SHORTNESS OF BREATH OR wheezing 18 g 0   amLODipine (NORVASC) 5 MG tablet Take 1 tablet (5 mg total) by mouth daily. 90 tablet 0   atorvastatin (LIPITOR) 20 MG tablet Take 1 tablet (20 mg total) by mouth every evening. 30 tablet 0   docusate sodium (COLACE) 100 MG capsule Take 100 mg by mouth 2 (  two) times daily.     gabapentin (NEURONTIN) 600 MG tablet Take 600 mg by mouth 4 (four) times daily.     lactulose (CHRONULAC) 10 GM/15ML solution Take 30 mLs by mouth in the morning, at noon, in the evening, and at bedtime.     LINZESS 290 MCG CAPS capsule Take 290 mcg by mouth every morning.     lisinopril (ZESTRIL) 40 MG tablet Take 1 tablet (40 mg total) by mouth every evening. 90 tablet 0   metoprolol tartrate (LOPRESSOR) 25 MG tablet Take 1 tablet (25 mg total) by mouth 2 (two) times daily. 180 tablet 0   mometasone-formoterol (DULERA) 100-5 MCG/ACT AERO Inhale 2 puffs into the lungs daily. 13 g 0   naloxone (NARCAN) 4 MG/0.1ML LIQD nasal spray kit Place 1 spray into the nose as needed (opioid overdose).     Naloxone HCl (NARCAN IJ) Inject 1 Dose as directed as needed (opioid overdose).     nicotine (NICODERM CQ - DOSED IN MG/24 HR) 7 mg/24hr patch Place 1 patch (7 mg total) onto the skin daily. 28 patch 3   omeprazole (PRILOSEC) 40 MG capsule TAKE 1 CAPSULE BY MOUTH TWICE A DAY 180 capsule 3   ondansetron (ZOFRAN) 8 MG tablet Take 1 tablet (8 mg total) by mouth every 8 (eight) hours as needed for nausea or vomiting. 90 tablet 1   oxyCODONE (ROXICODONE) 15 MG immediate release tablet Take 15 mg by mouth.     sucralfate (CARAFATE) 1 GM/10ML suspension TAKE 10 MLS (1 G TOTAL) BY MOUTH 4 (FOUR) TIMES DAILY. 840 mL 0   SUMAtriptan (IMITREX) 25 MG tablet Take 1 tablet by mouth as needed.     Tiotropium Bromide Monohydrate (SPIRIVA RESPIMAT) 2.5 MCG/ACT AERS INHALE 1 PUFF BY MOUTH INTO LUNGS ONCE DAILY 4 g 0   naproxen (NAPROSYN) 500 MG tablet Take 500  mg by mouth 2 (two) times daily. (Patient not taking: Reported on 04/09/2022)     No current facility-administered medications on file prior to visit.    Allergies:   Allergies  Allergen Reactions   Cyclobenzaprine Hives, Swelling and Rash    Facial/lip swelling      Levofloxacin Hives, Swelling and Rash   Chlorpheniramine Maleate     Other reaction(s): Unknown   Phenergan [Promethazine Hcl] Other (See Comments)    Agitation.    Phenylephrine Hcl Other (See Comments)   Toradol [Ketorolac Tromethamine] Swelling and Other (See Comments)    Facial/tongue swelling    Zoloft [Sertraline Hcl] Swelling    Tongue swelling      Cephalexin Hives and Rash   Ketorolac Rash   Tramadol Hives, Swelling, Other (See Comments) and Rash    Lip swelling       OBJECTIVE:  Physical Exam  Vitals:   04/09/22 1315  BP: (!) 141/76  Pulse: (!) 56  Weight: 181 lb (82.1 kg)  Height: _0  (1.626 m)   Body mass index is 31.07 kg/m. No results found.   General: well developed, well nourished, pleasant middle-age Caucasian female, seated, in no evident distress Head: head normocephalic and atraumatic.   Neck: supple with no carotid or supraclavicular bruits Cardiovascular: regular rate and rhythm, no murmurs Musculoskeletal: no deformity Skin:  no rash/petichiae Vascular:  Normal pulses all extremities   Neurologic Exam Mental Status: Awake and fully alert. Oriented to place and time. Recent and remote memory intact. Attention span, concentration and fund of knowledge appropriate. Mood and affect flat.  Cranial Nerves: Pupils equal,  briskly reactive to light. Extraocular movements full without nystagmus. Visual fields full to confrontation. Hearing intact. Facial sensation intact. Face, tongue, palate moves normally and symmetrically.  Motor: Normal bulk and tone. Normal strength in all tested extremity muscles Sensory.: intact to touch , pinprick , position and vibratory sensation with  exception of decreased to finger pads at bilateral finger tips Coordination: Rapid alternating movements normal in all extremities. Finger-to-nose and heel-to-shin performed accurately bilaterally. Gait and Station: Arises from chair without difficulty. Stance is normal. Gait demonstrates normal stride length and balance without use of AD.  Reflexes: 2+ and symmetric. Toes downgoing.         ASSESSMENT/PLAN: Sara Munoz is a 56 y.o. year old female   Long history of chronic migraine headaches Multiple cervical decompression surgery, continue complains of neck pain, under pain management             Headache has a lot of migraine features, likely cervicogenic component, medication rebound  Discussed initiating Aimovig 163m monthly injection as previously advised by Dr. YKrista Blue- new rx sent to pharmacy  Use of sumatriptan as needed for abortive headaches, may combine tizanidine and Zofran for prolonged headaches  Insurance denied UKristopher Gleeto further discuss with neurosurgery left-sided head pain which has been present since her prior laminectomy  Obstructive sleep apnea             Narrow oropharyngeal space, loud snoring, frequent awakening, morning headaches,             Referral to sleep study placed at prior visit - no showed 3/28 - encouraged rescheduling of visit  Bilateral hands paresthesia             EMG nerve conduction study 08/2021 bilateral carpal tunnel syndrome, right moderate, left mild    Follow up in 6 months or call earlier if needed    CC:  PCP: [name], MRiki Sheer MD    I spent 31 minutes of face-to-face and non-face-to-face time with patient.  This included previsit chart review, lab review, study review, order entry, electronic health record documentation, patient education regarding regarding above diagnoses and treatment plan and answered all the questions to patient's satisfaction   JFrann Rider [facility] [address] [address] Phone [phone] [phone]: This document was prepared with digital dictation and possible smart phrase technology. Any transcriptional errors that result from this process are unintentional.  Addendum: Neurosurgery evaluation by Dr. TDeatra Inaon April 12, 2022, C2-3 for bilateral nerve block with great response, diagnosed with severe left occipital neuralgia with worsening, plan was to go through insurance carrier likely for preauthorization for cervical decompression ?

## 2022-04-09 NOTE — Patient Instructions (Addendum)
Start Aimovig monthly injection for headaches  Continue Imitrex as needed for rescue medications   Follow up with neurosurgery and pain management this week as scheduled     Follow up in 6 months or call earlier if needed

## 2022-04-11 DIAGNOSIS — M25511 Pain in right shoulder: Secondary | ICD-10-CM | POA: Diagnosis not present

## 2022-04-11 DIAGNOSIS — M79661 Pain in right lower leg: Secondary | ICD-10-CM | POA: Diagnosis not present

## 2022-04-11 DIAGNOSIS — M25512 Pain in left shoulder: Secondary | ICD-10-CM | POA: Diagnosis not present

## 2022-04-11 DIAGNOSIS — M79622 Pain in left upper arm: Secondary | ICD-10-CM | POA: Diagnosis not present

## 2022-04-11 DIAGNOSIS — G894 Chronic pain syndrome: Secondary | ICD-10-CM | POA: Diagnosis not present

## 2022-04-11 DIAGNOSIS — M79621 Pain in right upper arm: Secondary | ICD-10-CM | POA: Diagnosis not present

## 2022-04-11 DIAGNOSIS — M79662 Pain in left lower leg: Secondary | ICD-10-CM | POA: Diagnosis not present

## 2022-04-11 DIAGNOSIS — M5136 Other intervertebral disc degeneration, lumbar region: Secondary | ICD-10-CM | POA: Diagnosis not present

## 2022-04-11 DIAGNOSIS — M542 Cervicalgia: Secondary | ICD-10-CM | POA: Diagnosis not present

## 2022-04-11 DIAGNOSIS — M545 Low back pain, unspecified: Secondary | ICD-10-CM | POA: Diagnosis not present

## 2022-04-11 DIAGNOSIS — G890 Central pain syndrome: Secondary | ICD-10-CM | POA: Diagnosis not present

## 2022-04-12 DIAGNOSIS — M5481 Occipital neuralgia: Secondary | ICD-10-CM | POA: Diagnosis not present

## 2022-04-12 DIAGNOSIS — M4312 Spondylolisthesis, cervical region: Secondary | ICD-10-CM | POA: Diagnosis not present

## 2022-04-12 DIAGNOSIS — Z6831 Body mass index (BMI) 31.0-31.9, adult: Secondary | ICD-10-CM | POA: Diagnosis not present

## 2022-04-12 DIAGNOSIS — M47812 Spondylosis without myelopathy or radiculopathy, cervical region: Secondary | ICD-10-CM | POA: Diagnosis not present

## 2022-04-15 ENCOUNTER — Other Ambulatory Visit: Payer: Self-pay | Admitting: Obstetrics and Gynecology

## 2022-04-15 DIAGNOSIS — S82209A Unspecified fracture of shaft of unspecified tibia, initial encounter for closed fracture: Secondary | ICD-10-CM | POA: Diagnosis not present

## 2022-04-15 DIAGNOSIS — J449 Chronic obstructive pulmonary disease, unspecified: Secondary | ICD-10-CM | POA: Diagnosis not present

## 2022-04-15 NOTE — Patient Instructions (Signed)
Hi Sara Munoz a nice day and don't forget to check on Orthopedic appointment.  Sara Munoz was given information about Medicaid Managed Care team care coordination services as a part of their [place] Complete Medicaid benefit. Sara Munoz verbally consented to engagement with the [facility] Managed Care team.   If you are experiencing a medical emergency, please call 911 or report to your local emergency department or urgent care.   If you have a non-emergency medical problem during routine business hours, please contact your provider's office and ask to speak with a nurse.   For questions related to your [place] Complete Medicaid health plan, please call: [phone]  If you would like to schedule transportation through your [place] Complete Medicaid plan, please call the following number at least 2 days in advance of your appointment: [phone].   There is no limit to the number of trips during the year between medical appointments, healthcare facilities, or pharmacies. Transportation must be scheduled at least 2 business days before but not more than thirty 30 days before of your appointment.  Call the [facility] at [phone], at any time, 24 hours a day, 7 days a week. If you are in danger or need immediate medical attention call 911.  If you would like help to quit smoking, call 1-800-QUIT-NOW (310) 454-9706) OR Espaol: 1-855-Djelo-Ya (9-702-637-8588) o para ms informacin haga clic aqu or Text READY to 200-400 to register via text  Sara Munoz - following are the goals we discussed in your visit today:   Timeframe:  Long-Range Goal Priority:  High Start Date:       12/20/20                      Expected End Date:    ongoing                   Follow Up Date: 05/14/22 - schedule appointment for flu shot - schedule appointment for vaccines needed due to my age or health - schedule recommended health tests (blood work, mammogram, colonoscopy, pap  test) - schedule and keep appointment for annual check-up   Why is this important?   Screening tests can find diseases early when they are easier to treat.  Your doctor or nurse will talk with you about which tests are important for you.  Getting shots for common diseases like the flu and shingles will help prevent them.   04/15/22:  Patient up to date on all appts.  Patient verbalizes understanding of instructions and care plan provided today and agrees to view in [place]. Active MyChart status and patient understanding of how to access instructions and care plan via MyChart confirmed with patient.     The Managed Medicaid care management team will reach out to the patient again over the next 30 business  days.  The  Patient has been provided with contact information for the Managed Medicaid care management team and has been advised to call with any health related questions or concerns.   Sara Raider RN, BSN [place] Management Coordinator - Managed Medicaid High Risk [phone]   Following is a copy of your plan of care:  Care Plan : General Plan of Care (Adult)  Updates made by Sara Medicus, RN since 04/15/2022 12:00 AM     Problem: Health Promotion or Disease Self-Management (General Plan of Care)   Priority: High  Onset Date: 12/20/2020     Long-Range Goal:  Self-Management Plan Developed   Start Date: 12/20/2020  Expected End Date: 07/16/2022  Recent Progress: Not on track  Priority: High  Note:    Current Barriers:  Knowledge Deficits related to short term plan for care coordination needs and long term plans for chronic disease management needs.  Patient with many chronic health conditions. 04/15/22:  patient up to date on all appts-taking all meds.  To start new migraine medicine this month.Smokes 1/2 ppd. Nurse Case Manager Clinical Goal(s):  patient will work with care management team to address care coordination and chronic disease  management needs related to Disease Management Educational Needs Care Coordination Medication Management and Education Medication Reconciliation Medication Assistance  Psychosocial Support Mental Health Counseling   Interventions:  Evaluation of current treatment plan  and patient's adherence to plan as established by provider. Reviewed medications with patient. Collaborated with pharmacy regarding medications.Marland Kitchen  Collaborated with SW regarding anxiety. Discussed plans with patient for ongoing care management follow up and provided patient with direct contact information for care management team Advised patient, providing education and rationale, to monitor blood pressure daily and record, calling PCP for findings outside established parameters.  Reviewed scheduled/upcoming provider appointments. Social Work referral for anxiety, PTSD-completed Pharmacy referral for medication review-completed. Patient given 1-800-QUITNOW for smoking cessation if interested.  Self Care Activities:  Patient will self administer medications as prescribed Patient will attend all scheduled provider appointments Patient will call pharmacy for medication refills Patient will continue to perform ADL's independently Patient will call provider office for new concerns or questions Patient Goals: In the next 90 days, patient will attend all scheduled appointments. - Follow Up Plan: The patient has been provided with contact information for the care management team and has been advised to call with any health related questions or concerns.  The care management team will reach out to the patient again over the next 30 business  days.  Implement additional goals and interventions based on identified risk factors.

## 2022-04-15 NOTE — Patient Outreach (Signed)
Medicaid Managed Care   Nurse Care Manager Note  04/15/2022 Name:  Sara Munoz MRN:  [id number] DOB:  1966/03/07  Sara Munoz is an 56 y.o. year old female who is a primary patient of [name], Riki Sheer, MD.  The Medicaid Managed Care Coordination team was consulted for assistance with:    Chronic healthcare management needs, HTN, tobacco use, migrianes, CAD, COPD, chronic pain, OSA, constipation, HLD, anxiety  Ms. Draughon was given information about Medicaid Managed Care Coordination team services today. Sara Munoz Patient agreed to services and verbal consent obtained.  Engaged with patient by telephone for follow up visit in response to provider referral for case management and/or care coordination services.   Assessments/Interventions:  Review of past medical history, allergies, medications, health status, including review of consultants reports, laboratory and other test data, was performed as part of comprehensive evaluation and provision of chronic care management services.  SDOH (Social Determinants of Health) assessments and interventions performed: SDOH Interventions    Flowsheet Row Patient Outreach Telephone from 04/15/2022 in [place] Patient Outreach Telephone from 03/15/2022 in [place] Patient Outreach Telephone from 01/30/2022 in [place] Patient Outreach Telephone from 12/24/2021 in [place] Patient Outreach Telephone from 11/23/2021 in [place] Patient Outreach Telephone from 10/23/2021 in [place] Coordination  SDOH Interventions        Food Insecurity Interventions -- -- Intervention Not Indicated -- -- --  Housing Interventions -- -- -- -- Intervention Not Indicated --  Transportation Interventions Intervention Not  Indicated -- -- -- -- --  Utilities Interventions -- Intervention Not Indicated -- -- -- --  Alcohol Usage Interventions -- Intervention Not Indicated (Score <7) -- -- -- --  Financial Strain Interventions Intervention Not Indicated -- -- -- -- --  Physical Activity Interventions -- -- -- Other (Comments)  [unable to participate in this type of exercise] -- --  Stress Interventions -- -- -- -- -- Other (Comment)  [sees Psych and counselor]       Care Plan  Allergies  Allergen Reactions   Cyclobenzaprine Hives, Swelling and Rash    Facial/lip swelling      Levofloxacin Hives, Swelling and Rash   Chlorpheniramine Maleate     Other reaction(s): Unknown   Phenergan [Promethazine Hcl] Other (See Comments)    Agitation.    Phenylephrine Hcl Other (See Comments)   Toradol [Ketorolac Tromethamine] Swelling and Other (See Comments)    Facial/tongue swelling    Zoloft [Sertraline Hcl] Swelling    Tongue swelling      Cephalexin Hives and Rash   Ketorolac Rash   Tramadol Hives, Swelling, Other (See Comments) and Rash    Lip swelling     Medications Reviewed Today     Reviewed by [name], RN (Registered Nurse) on 04/15/22 at [place] List Status: <None>   Medication Order Taking? Sig Documenting Provider Last Dose Status Informant  albuterol (PROVENTIL) (2.5 MG/3ML) 0.083% nebulizer solution [id number] No Take 3 mLs (2.5 mg total) by nebulization every 6 (six) hours as needed for wheezing or shortness of breath. [name], [name], MD Taking Active   albuterol (VENTOLIN HFA) 108 (90 Base) MCG/ACT inhaler [id number] No INHALE TWO PUFFS BY MOUTH INTO LUNGS EVERY 6 HOURS AS NEEDED FOR SHORTNESS OF BREATH OR wheezing [name], [name], MD Taking Active   amLODipine (NORVASC) 5 MG tablet  [id number] No Take 1 tablet (5 mg total) by mouth daily. [name], Riki Sheer, MD Taking Active   aspirin EC 81 MG tablet [id number]  Take 81 mg by mouth daily. Swallow whole.  [provider]  Active   atorvastatin (LIPITOR) 20 MG tablet [id number] No Take 1 tablet (20 mg total) by mouth every evening. [name], [name], MD Taking Active   docusate sodium (COLACE) 100 MG capsule [id number] No Take 100 mg by mouth 2 (two) times daily. [provider] Taking Active Self  Erenumab-aooe (AIMOVIG) 140 MG/ML SOAJ [id number]  Inject 140 mg into the skin every 3 (three) months. Frann Rider, NP  Active   gabapentin (NEURONTIN) 600 MG tablet [id number] No Take 600 mg by mouth 4 (four) times daily. [provider] Taking Active Self  lactulose (CHRONULAC) 10 GM/15ML solution [id number] No Take 30 mLs by mouth in the morning, at noon, in the evening, and at bedtime. [provider] Taking Active   LINZESS 290 MCG CAPS capsule [id number] No Take 290 mcg by mouth every morning. [provider] Taking Active   lisinopril (ZESTRIL) 40 MG tablet [id number] No Take 1 tablet (40 mg total) by mouth every evening. [name], [name], MD Taking Active   metoprolol tartrate (LOPRESSOR) 25 MG tablet [id number] No Take 1 tablet (25 mg total) by mouth 2 (two) times daily. [name], [name], MD Taking Active   mometasone-formoterol [facility]) 100-5 MCG/ACT AERO [id number] No Inhale 2 puffs into the lungs daily. [name], [name], MD Taking Active   naloxone [facility]) 4 MG/0.1ML LIQD nasal spray kit [id number] No Place 1 spray into the nose as needed (opioid overdose). [provider] Taking Active Self           Med Note Ivor Reining May 03, 2021 11:23 AM)    Naloxone HCl [facility]) [id number] No Inject 1 Dose as directed as needed (opioid overdose). [provider] Taking Active Self           Med Note Ivor Reining May 03, 2021 11:32 AM)    naproxen (NAPROSYN) 500 MG tablet [id number] No Take 500 mg by mouth 2 (two) times daily.  Patient not taking: Reported on 04/09/2022   [provider] Not Taking Active   nicotine (NICODERM CQ - DOSED IN MG/24 HR) 7 mg/24hr patch [id number] No Place 1 patch (7 mg total) onto the skin daily. [name], Riki Sheer, MD Taking Active   omeprazole (PRILOSEC) 40 MG capsule [id number] No TAKE 1 CAPSULE BY MOUTH TWICE A DAY [name], MD Taking Active   ondansetron [facility]) 8 MG tablet [id number] No Take 1 tablet (8 mg total) by mouth every 8 (eight) hours as needed for nausea or vomiting. [name], [name], MD Taking Active   oxyCODONE (ROXICODONE) 15 MG immediate release tablet [id number] No Take 15 mg by mouth. [provider] Taking Active   sucralfate (CARAFATE) 1 GM/10ML suspension [id number] No TAKE 10 MLS (1 G TOTAL) BY MOUTH 4 (FOUR) TIMES DAILY. [name], MD Taking Active   SUMAtriptan (IMITREX) 25 MG tablet [id number] No Take 1 tablet by mouth as needed. [provider] Taking Active   Tiotropium Bromide Monohydrate (SPIRIVA RESPIMAT) 2.5 MCG/ACT AERS [id number] No INHALE 1 PUFF BY MOUTH INTO LUNGS ONCE DAILY [name], [name], MD Taking Active             Patient Active Problem List   Diagnosis Date Noted   Encounter for annual physical examination excluding gynecological examination in  a patient older than 17 years 04/03/2022   Essential hypertension 03/11/2022   Lumbar spondylosis 02/12/2022   Chronic migraine w/o aura w/o status migrainosus, not intractable 07/09/2021   Obstructive sleep apnea 07/09/2021   Body mass index (BMI) 32.0-32.9, adult 06/07/2021   Pseudoarthrosis of cervical spine ([place]) 05/14/2021   Lumbar radiculopathy 05/23/2020   Sacroiliac inflammation ([place]) 11/25/2019   Sepsis ([place]) 10/19/2018   Severe sepsis ([place]) 10/19/2018   Cervical radiculopathy 02/12/2018   Chronic migraine 02/12/2018   Paresthesia 08/29/2017   Neck pain 08/29/2017   Daily headache 08/29/2017   Chronic active hepatitis ([place]) 07/29/2017   Neurogenic pain 07/31/2016   Chronic pain  syndrome 06/05/2016   Carotid artery stenosis 02/27/2016   Chronic constipation 02/27/2016   Chronic nausea 02/27/2016   Hypercholesterolemia 02/27/2016   Osteoarthritis 02/27/2016   PTSD (post-traumatic stress disorder) 02/27/2016   TIA (transient ischemic attack) 02/27/2016   Long term current use of opiate analgesic 02/27/2016   Long term prescription opiate use 02/27/2016   Encounter for pain management consult 02/27/2016   Chronic hip pain (Location of Tertiary source of pain) (Bilateral) (L>R) 02/27/2016   Chronic knee pain (Bilateral) (L>R) 02/27/2016   Chronic shoulder pain (Bilateral) (L>R) 02/27/2016   Chronic sacroiliac joint pain (Bilateral) (L>R) 02/27/2016   Chronic low back pain (Location of Primary Source of Pain) (Bilateral) (L>R) 02/27/2016   Chronic lower extremity pain (Location of Secondary source of pain) (Bilateral) (L>R) 02/27/2016   Osteoarthritis of hip (Bilateral) (L>R) 02/27/2016   Chronic neck pain (posterior midline) (Bilateral) (L>R) 02/27/2016   Cervicogenic headache (Bilateral) (L>R) 02/27/2016   Occipital headache (Bilateral) (L>R) 02/27/2016   Chronic upper extremity pain (Bilateral) (L>R) 02/27/2016   Chronic Cervical radicular pain (Bilateral) (L>R) 02/27/2016   Chronic lumbar radicular pain (Right) (L5 dermatome) 02/27/2016   Lumbar facet syndrome (Bilateral) (L>R) 02/27/2016   Long term prescription benzodiazepine use 02/27/2016   Hypertension    Chronic obstructive pulmonary disease ([place]) 10/09/2015   GERD (gastroesophageal reflux disease) 10/09/2015   Non-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/2014 08/02/2015   Hyperlipidemia 08/02/2015   Costochondritis 08/02/2015   Drug abuse, 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[place]) 09/03/2014   GAD (generalized anxiety disorder) 09/03/2014   Narcotic dependence ([place]) 09/03/2014   PVD (peripheral vascular disease) ([place]) 09/03/2014   Smoker 09/03/2014   Cigarette nicotine dependence with nicotine-induced disorder 07/08/2014   Anemia of chronic  disease 03/19/2014   Narcotic abuse, continuous ([place]) 03/19/2014   Polysubstance abuse ([place]) 03/19/2014   MSSA (methicillin susceptible Staphylococcus aureus) septicemia ([place]) 03/07/2014   Hypomagnesemia 01/04/2014   Chronic cough 09/04/2011   Sleep apnea, obstructive 09/04/2011   Sinusitis, acute 09/04/2011   Conditions to be addressed/monitored per PCP order:  Chronic healthcare management needs, HTN, tobacco use, migrianes, CAD, COPD, chronic pain, OSA, constipation, HLD, anxiety, GERD, ostoarthritis  Care Plan : General Plan of Care (Adult)  Updates made by [name], RN since 04/15/2022 12:00 AM     Problem: Health Promotion or Disease Self-Management (General Plan of Care)   Priority: High  Onset Date: 12/20/2020     Long-Range Goal: Self-Management Plan Developed   Start Date: 12/20/2020  Expected End Date: 07/16/2022  Recent Progress: Not on track  Priority: High  Note:    Current Barriers:  Knowledge Deficits related to short term plan for care coordination needs and long term plans for chronic disease management needs.  Patient with many chronic health conditions. 04/15/22:  patient up to date on all appts-taking all meds.  To start new migraine medicine this month.Smokes 1/2 ppd. Nurse Case Manager Clinical Goal(s):  patient will work with care management team to address care coordination and chronic disease management needs related to Disease Management Educational Needs Care Coordination Medication Management and Education Medication Reconciliation Medication Assistance  Psychosocial Support Mental Health Counseling   Interventions:  Evaluation of current treatment plan  and patient's adherence to plan as established by provider. Reviewed medications with patient. Collaborated with pharmacy regarding medications.Marland Kitchen  Collaborated with SW regarding anxiety. Discussed plans with patient for ongoing care management follow up and provided patient with direct  contact information for care management team Advised patient, providing education and rationale, to monitor blood pressure daily and record, calling PCP for findings outside established parameters.  Reviewed scheduled/upcoming provider appointments. Social Work referral for anxiety, PTSD-completed Pharmacy referral for medication review-completed. Patient given 1-800-QUITNOW for smoking cessation if interested.  Self Care Activities:  Patient will self administer medications as prescribed Patient will attend all scheduled provider appointments Patient will call pharmacy for medication refills Patient will continue to perform ADL's independently Patient will call provider office for new concerns or questions Patient Goals: In the next 90 days, patient will attend all scheduled appointments. - Follow Up Plan: The patient has been provided with contact information for the care management team and has been advised to call with any health related questions or concerns.  The care management team will reach out to the patient again over the next 30 business  days.  Implement additional goals and interventions based on identified risk factors.    Follow Up:  Patient agrees to Care Plan and Follow-up.  Plan: The Managed Medicaid care management team will reach out to the patient again over the next 30 business  days. and The  Patient has been provided with contact information for the Managed Medicaid care management team and has been advised to call with any health related questions or concerns.  Date/time of next scheduled RN care management/care coordination outreach: 05/14/22 at 1230.

## 2022-04-19 ENCOUNTER — Ambulatory Visit: Payer: Medicaid Other | Admitting: Cardiology

## 2022-04-19 DIAGNOSIS — M4310 Spondylolisthesis, site unspecified: Secondary | ICD-10-CM | POA: Insufficient documentation

## 2022-04-22 DIAGNOSIS — S129XXS Fracture of neck, unspecified, sequela: Secondary | ICD-10-CM | POA: Diagnosis not present

## 2022-04-22 DIAGNOSIS — M25512 Pain in left shoulder: Secondary | ICD-10-CM | POA: Diagnosis not present

## 2022-04-22 DIAGNOSIS — M75122 Complete rotator cuff tear or rupture of left shoulder, not specified as traumatic: Secondary | ICD-10-CM | POA: Diagnosis not present

## 2022-04-22 DIAGNOSIS — S82892D Other fracture of left lower leg, subsequent encounter for closed fracture with routine healing: Secondary | ICD-10-CM | POA: Diagnosis not present

## 2022-04-22 DIAGNOSIS — G8929 Other chronic pain: Secondary | ICD-10-CM | POA: Diagnosis not present

## 2022-04-24 ENCOUNTER — Other Ambulatory Visit: Payer: Self-pay | Admitting: Orthopedic Surgery

## 2022-04-24 DIAGNOSIS — M75122 Complete rotator cuff tear or rupture of left shoulder, not specified as traumatic: Secondary | ICD-10-CM

## 2022-04-29 ENCOUNTER — Other Ambulatory Visit: Payer: Self-pay | Admitting: Family Medicine

## 2022-04-29 NOTE — Telephone Encounter (Signed)
Requested medication (s) are due for refill today:yes  Requested medication (s) are on the active medication list: yes  Last refill:  03/11/22  Future visit scheduled: no  Notes to clinic:  Unable to refill per protocol, cannot delegate.      Requested Prescriptions  Pending Prescriptions Disp Refills   ondansetron (ZOFRAN) 8 MG tablet [Pharmacy Med Name: ondansetron HCl 8 mg tablet] 90 tablet 1    Sig: Take 1 tablet (8 mg total) by mouth every 8 (eight) hours as needed for nausea or vomiting.     Not Delegated - Gastroenterology: Antiemetics - ondansetron Failed - 04/29/2022  8:03 AM      Failed - This refill cannot be delegated      Passed - AST in normal range and within 360 days    AST  Date Value Ref Range Status  04/03/2022 16 0 - 40 IU/L Final         Passed - ALT in normal range and within 360 days    ALT  Date Value Ref Range Status  04/03/2022 10 0 - 32 IU/L Final         Passed - Valid encounter within last 6 months    Recent Outpatient Visits           3 weeks ago Essential hypertension   [place], [place], MD   1 month ago Centrilobular emphysema ([place])   [place] [name], [place], MD   1 year ago Hypercholesterolemia   [facility], [name], [name]   2 years ago Encounter for tobacco use cessation counseling   [facility], [place], [place]   2 years ago Chronic nausea   [place], Wendee Beavers, [place]       Future Appointments             In 1 month Ag[place]-Etang, Aaron Edelman, MD [place]. [place]

## 2022-05-01 ENCOUNTER — Institutional Professional Consult (permissible substitution): Payer: Medicaid Other | Admitting: Neurology

## 2022-05-02 ENCOUNTER — Ambulatory Visit: Admission: RE | Admit: 2022-05-02 | Payer: Medicaid Other | Source: Ambulatory Visit

## 2022-05-08 ENCOUNTER — Ambulatory Visit: Payer: Medicaid Other | Admitting: Pulmonary Disease

## 2022-05-09 DIAGNOSIS — M25512 Pain in left shoulder: Secondary | ICD-10-CM | POA: Diagnosis not present

## 2022-05-09 DIAGNOSIS — M79661 Pain in right lower leg: Secondary | ICD-10-CM | POA: Diagnosis not present

## 2022-05-09 DIAGNOSIS — M79621 Pain in right upper arm: Secondary | ICD-10-CM | POA: Diagnosis not present

## 2022-05-09 DIAGNOSIS — G890 Central pain syndrome: Secondary | ICD-10-CM | POA: Diagnosis not present

## 2022-05-09 DIAGNOSIS — M545 Low back pain, unspecified: Secondary | ICD-10-CM | POA: Diagnosis not present

## 2022-05-09 DIAGNOSIS — G894 Chronic pain syndrome: Secondary | ICD-10-CM | POA: Diagnosis not present

## 2022-05-09 DIAGNOSIS — M5136 Other intervertebral disc degeneration, lumbar region: Secondary | ICD-10-CM | POA: Diagnosis not present

## 2022-05-09 DIAGNOSIS — M25511 Pain in right shoulder: Secondary | ICD-10-CM | POA: Diagnosis not present

## 2022-05-09 DIAGNOSIS — M542 Cervicalgia: Secondary | ICD-10-CM | POA: Diagnosis not present

## 2022-05-09 DIAGNOSIS — M79662 Pain in left lower leg: Secondary | ICD-10-CM | POA: Diagnosis not present

## 2022-05-09 DIAGNOSIS — M79622 Pain in left upper arm: Secondary | ICD-10-CM | POA: Diagnosis not present

## 2022-05-11 ENCOUNTER — Ambulatory Visit
Admission: RE | Admit: 2022-05-11 | Discharge: 2022-05-11 | Disposition: A | Discharge status: Home / Self Care | Payer: Medicaid Other | Source: Ambulatory Visit | Attending: Orthopedic Surgery | Admitting: Orthopedic Surgery

## 2022-05-11 DIAGNOSIS — M75122 Complete rotator cuff tear or rupture of left shoulder, not specified as traumatic: Secondary | ICD-10-CM

## 2022-05-11 DIAGNOSIS — M19012 Primary osteoarthritis, left shoulder: Secondary | ICD-10-CM | POA: Diagnosis not present

## 2022-05-11 DIAGNOSIS — M7552 Bursitis of left shoulder: Secondary | ICD-10-CM | POA: Diagnosis not present

## 2022-05-13 DIAGNOSIS — M47812 Spondylosis without myelopathy or radiculopathy, cervical region: Secondary | ICD-10-CM | POA: Diagnosis not present

## 2022-05-13 DIAGNOSIS — M25819 Other specified joint disorders, unspecified shoulder: Secondary | ICD-10-CM | POA: Diagnosis not present

## 2022-05-13 DIAGNOSIS — M75112 Incomplete rotator cuff tear or rupture of left shoulder, not specified as traumatic: Secondary | ICD-10-CM | POA: Diagnosis not present

## 2022-05-13 DIAGNOSIS — M7522 Bicipital tendinitis, left shoulder: Secondary | ICD-10-CM | POA: Diagnosis not present

## 2022-05-13 DIAGNOSIS — S82892D Other fracture of left lower leg, subsequent encounter for closed fracture with routine healing: Secondary | ICD-10-CM | POA: Diagnosis not present

## 2022-05-14 ENCOUNTER — Other Ambulatory Visit: Payer: Medicaid Other | Admitting: Obstetrics and Gynecology

## 2022-05-14 ENCOUNTER — Encounter: Payer: Self-pay | Admitting: Obstetrics and Gynecology

## 2022-05-14 NOTE — Patient Outreach (Signed)
Medicaid Managed Care   Nurse Care Manager Note  05/14/2022 Name:  Sara Munoz MRN:  [id number] DOB:  May 11, 1966  Sara Munoz is an 56 y.o. year old female who is a primary patient of [name], Riki Sheer, MD.  The Medicaid Managed Care Coordination team was consulted for assistance with:    Chronic healthcare management needs, HTN, tobacco use, migraines, CAD, COPD, chronic pain, OSA, constipation, HLD, anxiety, GERD, osteoarthritis.  [name] was given information about Medicaid Managed Care Coordination team services today. Sara Munoz Patient agreed to services and verbal consent obtained.  Engaged with patient by telephone for follow up visit in response to provider referral for case management and/or care coordination services.   Assessments/Interventions:  Review of past medical history, allergies, medications, health status, including review of consultants reports, laboratory and other test data, was performed as part of comprehensive evaluation and provision of chronic care management services.  SDOH (Social Determinants of Health) assessments and interventions performed: SDOH Interventions    Flowsheet Row Patient Outreach Telephone from 05/14/2022 in [place] Patient Outreach Telephone from 04/15/2022 in [place] Coordination Patient Outreach Telephone from 03/15/2022 in [place] Patient Outreach Telephone from 01/30/2022 in [place] Patient Outreach Telephone from 12/24/2021 in [place] Patient Outreach Telephone from 11/23/2021 in [place] Coordination  SDOH Interventions        Food Insecurity Interventions -- -- -- Intervention Not Indicated -- --  Housing Interventions Intervention Not Indicated -- -- -- -- Intervention Not Indicated   Transportation Interventions -- Intervention Not Indicated -- -- -- --  Utilities Interventions -- -- Intervention Not Indicated -- -- --  Alcohol Usage Interventions -- -- Intervention Not Indicated (Score <7) -- -- --  Financial Strain Interventions -- Intervention Not Indicated -- -- -- --  Physical Activity Interventions -- -- -- -- Other (Comments)  [unable to participate in this type of exercise] --  Stress Interventions Other (Comment)  [sees Psychiatrist and counselor] -- -- -- -- --     Care Plan  Allergies  Allergen Reactions   Cyclobenzaprine Hives, Swelling and Rash    Facial/lip swelling      Levofloxacin Hives, Swelling and Rash   Chlorpheniramine Maleate     Other reaction(s): Unknown   Phenergan [Promethazine Hcl] Other (See Comments)    Agitation.    Phenylephrine Hcl Other (See Comments)   Toradol [Ketorolac Tromethamine] Swelling and Other (See Comments)    Facial/tongue swelling    Zoloft [Sertraline Hcl] Swelling    Tongue swelling      Cephalexin Hives and Rash   Ketorolac Rash   Tramadol Hives, Swelling, Other (See Comments) and Rash    Lip swelling     Medications Reviewed Today     Reviewed by [name], RN (Registered Nurse) on 05/14/22 at 1242  Med List Status: <None>   Medication Order Taking? Sig Documenting Provider Last Dose Status Informant  albuterol (PROVENTIL) (2.5 MG/3ML) 0.083% nebulizer solution [id number] No Take 3 mLs (2.5 mg total) by nebulization every 6 (six) hours as needed for wheezing or shortness of breath. [name], [name], MD Taking Active   albuterol (VENTOLIN HFA) 108 (90 Base) MCG/ACT inhaler [id number] No INHALE TWO PUFFS BY MOUTH INTO LUNGS EVERY 6 HOURS AS NEEDED FOR SHORTNESS OF BREATH OR wheezing [name], [name], MD Taking Active   amLODipine (NORVASC) 5 MG  tablet [id number] No Take 1 tablet (5 mg total) by mouth daily. [name], Riki Sheer, MD Taking Active   aspirin EC 81 MG tablet [id number]   Take 81 mg by mouth daily. Swallow whole. [provider]  Active   atorvastatin (LIPITOR) 20 MG tablet [id number] No Take 1 tablet (20 mg total) by mouth every evening. [name], [name], MD Taking Active   docusate sodium (COLACE) 100 MG capsule [id number] No Take 100 mg by mouth 2 (two) times daily. [provider] Taking Active Self  Erenumab-aooe (AIMOVIG) 140 MG/ML SOAJ [id number]  Inject 140 mg into the skin every 3 (three) months. Frann Rider, NP  Active   gabapentin (NEURONTIN) 600 MG tablet [id number] No Take 600 mg by mouth 4 (four) times daily. [provider] Taking Active Self  lactulose (CHRONULAC) 10 GM/15ML solution [id number] No Take 30 mLs by mouth in the morning, at noon, in the evening, and at bedtime. [provider] Taking Active   LINZESS 290 MCG CAPS capsule [id number] No Take 290 mcg by mouth every morning. [provider] Taking Active   lisinopril (ZESTRIL) 40 MG tablet [id number] No Take 1 tablet (40 mg total) by mouth every evening. [name], [name], MD Taking Active   metoprolol tartrate (LOPRESSOR) 25 MG tablet [id number] No Take 1 tablet (25 mg total) by mouth 2 (two) times daily. [name], [name], MD Taking Active   mometasone-formoterol [facility]) 100-5 MCG/ACT AERO [id number] No Inhale 2 puffs into the lungs daily. [name], [name], MD Taking Active   naloxone [facility]) 4 MG/0.1ML LIQD nasal spray kit [id number] No Place 1 spray into the nose as needed (opioid overdose). [provider] Taking Active Self           Med Note Ivor Reining May 03, 2021 11:23 AM)    Naloxone HCl [facility]) [id number] No Inject 1 Dose as directed as needed (opioid overdose). [provider] Taking Active Self           Med Note Ivor Reining May 03, 2021 11:32 AM)    naproxen (NAPROSYN) 500 MG tablet [id number] No Take 500 mg by mouth 2 (two) times daily.  Patient not taking:  Reported on 04/09/2022   [provider] Not Taking Active   nicotine (NICODERM CQ - DOSED IN MG/24 HR) 7 mg/24hr patch [id number] No Place 1 patch (7 mg total) onto the skin daily. [name], Riki Sheer, MD Taking Active   omeprazole (PRILOSEC) 40 MG capsule [id number] No TAKE 1 CAPSULE BY MOUTH TWICE A DAY [name], MD Taking Active   ondansetron (ZOFRAN) 8 MG tablet [id number]  Take 1 tablet (8 mg total) by mouth every 8 (eight) hours as needed for nausea or vomiting. Virginia Crews, MD  Active   oxyCODONE (ROXICODONE) 15 MG immediate release tablet [id number] No Take 15 mg by mouth. [provider] Taking Active   sucralfate (CARAFATE) 1 GM/10ML suspension [id number] No TAKE 10 MLS (1 G TOTAL) BY MOUTH 4 (FOUR) TIMES DAILY. [name], MD Taking Active   SUMAtriptan (IMITREX) 25 MG tablet [id number] No Take 1 tablet by mouth as needed. [provider] Taking Active   Tiotropium Bromide Monohydrate (SPIRIVA RESPIMAT) 2.5 MCG/ACT AERS [id number] No INHALE 1 PUFF BY MOUTH INTO LUNGS ONCE DAILY [name], [name], MD Taking Active            Patient Active Problem List   Diagnosis Date Noted   Encounter for annual physical examination excluding gynecological examination  in a patient older than 17 years 04/03/2022   Essential hypertension 03/11/2022   Lumbar spondylosis 02/12/2022   Chronic migraine w/o aura w/o status migrainosus, not intractable 07/09/2021   Obstructive sleep apnea 07/09/2021   Body mass index (BMI) 32.0-32.9, adult 06/07/2021   Pseudoarthrosis of cervical spine ([place]) 05/14/2021   Lumbar radiculopathy 05/23/2020   Sacroiliac inflammation ([place]) 11/25/2019   Sepsis ([place]) 10/19/2018   Severe sepsis ([place]) 10/19/2018   Cervical radiculopathy 02/12/2018   Chronic migraine 02/12/2018   Paresthesia 08/29/2017   Neck pain 08/29/2017   Daily headache 08/29/2017   Chronic active hepatitis ([place]) 07/29/2017   Neurogenic pain 07/31/2016    Chronic pain syndrome 06/05/2016   Carotid artery stenosis 02/27/2016   Chronic constipation 02/27/2016   Chronic nausea 02/27/2016   Hypercholesterolemia 02/27/2016   Osteoarthritis 02/27/2016   PTSD (post-traumatic stress disorder) 02/27/2016   TIA (transient ischemic attack) 02/27/2016   Long term current use of opiate analgesic 02/27/2016   Long term prescription opiate use 02/27/2016   Encounter for pain management consult 02/27/2016   Chronic hip pain (Location of Tertiary source of pain) (Bilateral) (L>R) 02/27/2016   Chronic knee pain (Bilateral) (L>R) 02/27/2016   Chronic shoulder pain (Bilateral) (L>R) 02/27/2016   Chronic sacroiliac joint pain (Bilateral) (L>R) 02/27/2016   Chronic low back pain (Location of Primary Source of Pain) (Bilateral) (L>R) 02/27/2016   Chronic lower extremity pain (Location of Secondary source of pain) (Bilateral) (L>R) 02/27/2016   Osteoarthritis of hip (Bilateral) (L>R) 02/27/2016   Chronic neck pain (posterior midline) (Bilateral) (L>R) 02/27/2016   Cervicogenic headache (Bilateral) (L>R) 02/27/2016   Occipital headache (Bilateral) (L>R) 02/27/2016   Chronic upper extremity pain (Bilateral) (L>R) 02/27/2016   Chronic Cervical radicular pain (Bilateral) (L>R) 02/27/2016   Chronic lumbar radicular pain (Right) (L5 dermatome) 02/27/2016   Lumbar facet syndrome (Bilateral) (L>R) 02/27/2016   Long term prescription benzodiazepine use 02/27/2016   Hypertension    Chronic obstructive pulmonary disease ([place]) 10/09/2015   GERD (gastroesophageal reflux disease) 10/09/2015   Non-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/2014 08/02/2015   Hyperlipidemia 08/02/2015   Costochondritis 08/02/2015   Drug abuse, 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[place]) 09/03/2014   GAD (generalized anxiety disorder) 09/03/2014   Narcotic dependence ([place]) 09/03/2014   PVD (peripheral vascular disease) ([place]) 09/03/2014   Smoker 09/03/2014   Cigarette nicotine dependence with nicotine-induced disorder 07/08/2014    Anemia of chronic disease 03/19/2014   Narcotic abuse, continuous ([place]) 03/19/2014   Polysubstance abuse ([place]) 03/19/2014   MSSA (methicillin susceptible Staphylococcus aureus) septicemia ([place]) 03/07/2014   Hypomagnesemia 01/04/2014   Chronic cough 09/04/2011   Sleep apnea, obstructive 09/04/2011   Sinusitis, acute 09/04/2011   Conditions to be addressed/monitored per PCP order:  Chronic healthcare management needs, HTN, tobacco use, migraines, CAD, COPD, chronic pain, OSA, constipation, HLD, anxiety, GERD, osteoarthritis.  Care Plan : General Plan of Care (Adult)  Updates made by [name], RN since 05/14/2022 12:00 AM     Problem: Health Promotion or Disease Self-Management (General Plan of Care)   Priority: High  Onset Date: 12/20/2020     Long-Range Goal: Self-Management Plan Developed   Start Date: 12/20/2020  Expected End Date: 07/16/2022  Recent Progress: Not on track  Priority: High  Note:    Current Barriers:  Knowledge Deficits related to short term plan for care coordination needs and long term plans for chronic disease management needs.  Patient with many chronic health conditions. 05/14/22:  patient with neck and shoulder pain-to have surgery on  left shoulder for rotator cuff repair.  Hoping to have 'nerve burning' for neck if approved. Has follow up appointments scheduled with GYN, PULM, and CARDS.  Smokes 1/2 ppd or less. Nurse Case Manager Clinical Goal(s):  patient will work with care management team to address care coordination and chronic disease management needs related to Disease Management Educational Needs Care Coordination Medication Management and Education Medication Reconciliation Medication Assistance  Psychosocial Support Mental Health Counseling   Interventions:  Evaluation of current treatment plan  and patient's adherence to plan as established by provider. Reviewed medications with patient. Collaborated with pharmacy regarding  medications.Marland Kitchen  Collaborated with SW regarding anxiety. Discussed plans with patient for ongoing care management follow up and provided patient with direct contact information for care management team Advised patient, providing education and rationale, to monitor blood pressure daily and record, calling PCP for findings outside established parameters.  Reviewed scheduled/upcoming provider appointments. Social Work referral for anxiety, PTSD-completed Pharmacy referral for medication review-completed. Patient given 1-800-QUITNOW for smoking cessation if interested.  Self Care Activities:  Patient will self administer medications as prescribed Patient will attend all scheduled provider appointments Patient will call pharmacy for medication refills Patient will continue to perform ADL's independently Patient will call provider office for new concerns or questions Patient Goals: In the next 90 days, patient will attend all scheduled appointments. - Follow Up Plan: The patient has been provided with contact information for the care management team and has been advised to call with any health related questions or concerns.  The care management team will reach out to the patient again over the next 30 business  days.  Implement additional goals and interventions based on identified risk factors.    Follow Up:  Patient agrees to Care Plan and Follow-up.  Plan: The Managed Medicaid care management team will reach out to the patient again over the next 30 business  days. and The  Patient has been provided with contact information for the Managed Medicaid care management team and has been advised to call with any health related questions or concerns.  Date/time of next scheduled RN care management/care coordination outreach:  06/14/22 at 1045.

## 2022-05-14 NOTE — Patient Instructions (Signed)
Hi Sara Munoz, I hope you have a great Thanksgiving-have a great afternoon!!  Sara Munoz was given information about Medicaid Managed Care team care coordination services as a part of their [place] Complete Medicaid benefit. Sara Munoz verbally consented to engagement with the [facility] Managed Care team.   If you are experiencing a medical emergency, please call 911 or report to your local emergency department or urgent care.   If you have a non-emergency medical problem during routine business hours, please contact your provider's office and ask to speak with a nurse.   For questions related to your [place] Complete Medicaid health plan, please call: [phone]  If you would like to schedule transportation through your [place] Complete Medicaid plan, please call the following number at least 2 days in advance of your appointment: [phone].   There is no limit to the number of trips during the year between medical appointments, healthcare facilities, or pharmacies. Transportation must be scheduled at least 2 business days before but not more than thirty 30 days before of your appointment.  Call the [facility] at [phone], at any time, 24 hours a day, 7 days a week. If you are in danger or need immediate medical attention call 911.  If you would like help to quit smoking, call 1-800-QUIT-NOW 7802458453) OR Espaol: 1-855-Djelo-Ya (4-562-563-8937) o para ms informacin haga clic aqu or Text READY to 200-400 to register via text  Sara Munoz - following are the goals we discussed in your visit today:  Timeframe:  Long-Range Goal Priority:  High Start Date:       12/20/20                      Expected End Date:    ongoing                   Follow Up Date: 06/14/22 - schedule appointment for flu shot - schedule appointment for vaccines needed due to my age or health - schedule recommended health tests (blood work, mammogram, colonoscopy, pap test) -  schedule and keep appointment for annual check-up   Why is this important?   Screening tests can find diseases early when they are easier to treat.  Your doctor or nurse will talk with you about which tests are important for you.  Getting shots for common diseases like the flu and shingles will help prevent them.   05/14/22:  Patient with upcoming appts: GYN, PULM, CARDS  Patient verbalizes understanding of instructions and care plan provided today and agrees to view in [place]. Active MyChart status and patient understanding of how to access instructions and care plan via MyChart confirmed with patient.     The Managed Medicaid care management team will reach out to the patient again over the next 30 business  days.  The  Patient  has been provided with contact information for the Managed Medicaid care management team and has been advised to call with any health related questions or concerns.   [name] RN, BSN [place]  [facility] - Managed Medicaid High Risk [phone].   Following is a copy of your plan of care:  Care Plan : General Plan of Care (Adult)  Updates made by [name], RN since 05/14/2022 12:00 AM     Problem: Health Promotion or Disease Self-Management (General Plan of Care)   Priority: High  Onset Date: 12/20/2020     Long-Range Goal: Self-Management  Plan Developed   Start Date: 12/20/2020  Expected End Date: 07/16/2022  Recent Progress: Not on track  Priority: High  Note:    Current Barriers:  Knowledge Deficits related to short term plan for care coordination needs and long term plans for chronic disease management needs.  Patient with many chronic health conditions. 05/14/22:  patient with neck and shoulder pain-to have surgery on left shoulder for rotator cuff repair.  Hoping to have 'nerve burning' for neck if approved. Has follow up appointments scheduled with GYN, PULM, and CARDS.  Smokes 1/2 ppd or  less. Nurse Case Manager Clinical Goal(s):  patient will work with care management team to address care coordination and chronic disease management needs related to Disease Management Educational Needs Care Coordination Medication Management and Education Medication Reconciliation Medication Assistance  Psychosocial Support Mental Health Counseling   Interventions:  Evaluation of current treatment plan  and patient's adherence to plan as established by provider. Reviewed medications with patient. Collaborated with pharmacy regarding medications.Marland Kitchen  Collaborated with SW regarding anxiety. Discussed plans with patient for ongoing care management follow up and provided patient with direct contact information for care management team Advised patient, providing education and rationale, to monitor blood pressure daily and record, calling PCP for findings outside established parameters.  Reviewed scheduled/upcoming provider appointments. Social Work referral for anxiety, PTSD-completed Pharmacy referral for medication review-completed. Patient given 1-800-QUITNOW for smoking cessation if interested.  Self Care Activities:  Patient will self administer medications as prescribed Patient will attend all scheduled provider appointments Patient will call pharmacy for medication refills Patient will continue to perform ADL's independently Patient will call provider office for new concerns or questions Patient Goals: In the next 90 days, patient will attend all scheduled appointments. - Follow Up Plan: The patient has been provided with contact information for the care management team and has been advised to call with any health related questions or concerns.  The care management team will reach out to the patient again over the next 30 business  days.  Implement additional goals and interventions based on identified risk factors.

## 2022-05-16 DIAGNOSIS — S82209A Unspecified fracture of shaft of unspecified tibia, initial encounter for closed fracture: Secondary | ICD-10-CM | POA: Diagnosis not present

## 2022-05-16 DIAGNOSIS — J449 Chronic obstructive pulmonary disease, unspecified: Secondary | ICD-10-CM | POA: Diagnosis not present

## 2022-05-22 ENCOUNTER — Encounter: Payer: Medicaid Other | Admitting: Advanced Practice Midwife

## 2022-05-27 ENCOUNTER — Other Ambulatory Visit: Payer: Self-pay | Admitting: Surgery

## 2022-05-27 ENCOUNTER — Encounter
Admission: RE | Admit: 2022-05-27 | Discharge: 2022-05-27 | Disposition: A | Discharge status: Home / Self Care | Payer: Medicaid Other | Source: Ambulatory Visit | Attending: Surgery | Admitting: Surgery

## 2022-05-27 DIAGNOSIS — R32 Unspecified urinary incontinence: Secondary | ICD-10-CM | POA: Diagnosis not present

## 2022-05-27 MED ORDER — CEFAZOLIN SODIUM-DEXTROSE 2-4 GM/100ML-% IV SOLN
2.0000 g | INTRAVENOUS | Status: AC
  Administered 2022-05-28: 2 g via INTRAVENOUS

## 2022-05-27 MED ORDER — CHLORHEXIDINE GLUCONATE 0.12 % MT SOLN: 15.0000 mL | Freq: Once | OROMUCOSAL | Status: AC

## 2022-05-27 MED ORDER — ORAL CARE MOUTH RINSE: 15.0000 mL | Freq: Once | OROMUCOSAL | Status: AC

## 2022-05-27 MED ORDER — LACTATED RINGERS IV SOLN: INTRAVENOUS | Status: DC

## 2022-05-27 NOTE — Patient Instructions (Addendum)
Your procedure is scheduled on: Tuesday May 28, 2022. Report to Day Surgery inside [facility] 2nd floor, stop by registration desk before getting on elevator. To find out your arrival time please call [phone] between 1PM - 3PM on Monday May 27, 2022.  Remember: Instructions that are not followed completely may result in serious medical risk,  up to and including death, or upon the discretion of your surgeon and anesthesiologist your  surgery may need to be rescheduled.     _X__ 1. Do not eat food after midnight the night before your procedure.                 No chewing gum or hard candies. You may drink clear liquids up to 2 hours                 before you are scheduled to arrive for your surgery- DO not drink clear                 liquids within 2 hours of the start of your surgery.                 Clear Liquids include:  water, apple juice without pulp, clear Gatorade, G2 or                  Gatorade Zero (avoid Red/Purple/Blue), Black Coffee or Tea (Do not add                 anything to coffee or tea).  __X__2.   Complete the "Ensure Clear Pre-surgery Clear Carbohydrate Drink" provided to you, 2 hours before arrival. **If you       are diabetic you will be provided with an alternative drink, Gatorade Zero or G2.  __X__3.  On the morning of surgery brush your teeth with toothpaste and water, you                may rinse your mouth with mouthwash if you wish.  Do not swallow any toothpaste or mouthwash.     _X__ 4.  No Alcohol for 24 hours before or after surgery.   _X__ 5.  Do Not Smoke or use e-cigarettes For 24 Hours Prior to Your Surgery.                 Do not use any chewable tobacco products for at least 6 hours prior to                 Surgery.  _X__  6.  Do not use any recreational drugs (marijuana, cocaine, heroin, ecstasy, MDMA or other)                For at least one week prior to your surgery.  Combination of these drugs with  anesthesia                May have life threatening results.  ____  7.  Bring all medications with you on the day of surgery if instructed.   __X__8.  Notify your doctor if there is any change in your medical condition      (cold, fever, infections).     Do not wear jewelry, make-up, hairpins, clips or nail polish. Do not wear lotions, powders, or perfumes. You may wear deodorant. Do not shave 48 hours prior to surgery. Men may shave face and neck. Do not bring valuables to the hospital.    [facility] is not responsible for any  belongings or valuables.  Contacts, dentures or bridgework may not be worn into surgery. Leave your suitcase in the car. After surgery it may be brought to your room. For patients admitted to the hospital, discharge time is determined by your treatment team.   Patients discharged the day of surgery will not be allowed to drive home.   Make arrangements for someone to be with you for the first 24 hours of your Same Day Discharge.   __X__ Take these medicines the morning of surgery with A SIP OF WATER:    1. amLODipine (NORVASC) 5 MG   2. gabapentin (NEURONTIN) 600 MG   3. metoprolol tartrate (LOPRESSOR) 25 MG   4. omeprazole (PRILOSEC) 40 M   5.  6.  ____ Fleet Enema (as directed)   __X__ Use CHG Soap (Antibacterial Soap) as directed  ____ Use Benzoyl Peroxide Gel as instructed  __X__ Use inhalers on the day of surgery  albuterol (VENTOLIN HFA) 108 (90 Base) MCG/ACT inhaler   mometasone-formoterol (DULERA) 100-5 MCG/ACT AERO   Tiotropium Bromide Monohydrate (SPIRIVA RESPIMAT) 2.5 MCG/ACT AERS  ____ Stop metformin 2 days prior to surgery    ____ Take 1/2 of usual insulin dose the night before surgery. No insulin the morning          of surgery.   ____ Call your PCP, cardiologist, or Pulmonologist if taking Coumadin/Plavix/aspirin and ask when to stop before your surgery.   __X__ One Week prior to surgery- Stop Anti-inflammatories such as  Ibuprofen, Aleve, Advil, Motrin, meloxicam (MOBIC), diclofenac, etodolac, ketorolac, Toradol, Daypro, piroxicam, Goody's or BC powders. OK TO USE TYLENOL IF NEEDED   __X__ Stop supplements until after surgery.    ____ Bring C-Pap to the hospital.    If you have any questions regarding your pre-procedure instructions,  Please call Pre-admit Testing at [phone]     Preparing for Surgery with CHLORHEXIDINE GLUCONATE (CHG) Soap  Chlorhexidine Gluconate (CHG) Soap  o An antiseptic cleaner that kills germs and bonds with the skin to continue killing germs even after washing  o Used for showering the night before surgery and morning of surgery  Before surgery, you can play an important role by reducing the number of germs on your skin.  CHG (Chlorhexidine gluconate) soap is an antiseptic cleanser which kills germs and bonds with the skin to continue killing germs even after washing.  Please do not use if you have an allergy to CHG or antibacterial soaps. If your skin becomes reddened/irritated stop using the CHG.  1. Shower the NIGHT BEFORE SURGERY and the MORNING OF SURGERY with CHG soap.  2. If you choose to wash your hair, wash your hair first as usual with your normal shampoo.  3. After shampooing, rinse your hair and body thoroughly to remove the shampoo.  4. Use CHG as you would any other liquid soap. You can apply CHG directly to the skin and wash gently with a scrungie or a clean washcloth.  5. Apply the CHG soap to your body only from the neck down. Do not use on open wounds or open sores. Avoid contact with your eyes, ears, mouth, and genitals (private parts). Wash face and genitals (private parts) with your normal soap.  6. Wash thoroughly, paying special attention to the area where your surgery will be performed.  7. Thoroughly rinse your body with warm water.  8. Do not shower/wash with your normal soap after using and rinsing off the CHG soap.  9. Pat yourself dry  with a clean towel.  10. Wear clean pajamas to bed the night before surgery.  12. Place clean sheets on your bed the night of your first shower and do not sleep with pets.  13. Shower again with the CHG soap on the day of surgery prior to arriving at the hospital.  14. Do not apply any deodorants/lotions/powders.  15. Please wear clean clothes to the hospital.

## 2022-05-28 ENCOUNTER — Ambulatory Visit: Payer: Medicaid Other | Admitting: Certified Registered"

## 2022-05-28 ENCOUNTER — Encounter
Admission: RE | Disposition: A | Discharge status: Home / Self Care | Payer: Self-pay | Source: Home / Self Care | Attending: Surgery

## 2022-05-28 ENCOUNTER — Ambulatory Visit
Admission: RE | Admit: 2022-05-28 | Discharge: 2022-05-28 | Disposition: A | Discharge status: Home / Self Care | Payer: Medicaid Other | Attending: Surgery | Admitting: Surgery

## 2022-05-28 ENCOUNTER — Other Ambulatory Visit: Payer: Self-pay

## 2022-05-28 ENCOUNTER — Encounter: Payer: Self-pay | Admitting: Surgery

## 2022-05-28 DIAGNOSIS — E7800 Pure hypercholesterolemia, unspecified: Secondary | ICD-10-CM | POA: Insufficient documentation

## 2022-05-28 DIAGNOSIS — F17210 Nicotine dependence, cigarettes, uncomplicated: Secondary | ICD-10-CM | POA: Diagnosis not present

## 2022-05-28 DIAGNOSIS — I252 Old myocardial infarction: Secondary | ICD-10-CM | POA: Diagnosis not present

## 2022-05-28 DIAGNOSIS — J449 Chronic obstructive pulmonary disease, unspecified: Secondary | ICD-10-CM | POA: Diagnosis not present

## 2022-05-28 DIAGNOSIS — M25812 Other specified joint disorders, left shoulder: Secondary | ICD-10-CM | POA: Diagnosis present

## 2022-05-28 DIAGNOSIS — M7542 Impingement syndrome of left shoulder: Secondary | ICD-10-CM | POA: Diagnosis not present

## 2022-05-28 DIAGNOSIS — M75112 Incomplete rotator cuff tear or rupture of left shoulder, not specified as traumatic: Secondary | ICD-10-CM | POA: Diagnosis not present

## 2022-05-28 DIAGNOSIS — I2510 Atherosclerotic heart disease of native coronary artery without angina pectoris: Secondary | ICD-10-CM | POA: Insufficient documentation

## 2022-05-28 DIAGNOSIS — M7582 Other shoulder lesions, left shoulder: Secondary | ICD-10-CM | POA: Diagnosis not present

## 2022-05-28 DIAGNOSIS — M549 Dorsalgia, unspecified: Secondary | ICD-10-CM | POA: Insufficient documentation

## 2022-05-28 DIAGNOSIS — G8929 Other chronic pain: Secondary | ICD-10-CM | POA: Diagnosis not present

## 2022-05-28 DIAGNOSIS — I739 Peripheral vascular disease, unspecified: Secondary | ICD-10-CM | POA: Diagnosis not present

## 2022-05-28 DIAGNOSIS — G4730 Sleep apnea, unspecified: Secondary | ICD-10-CM | POA: Diagnosis not present

## 2022-05-28 DIAGNOSIS — Z8673 Personal history of transient ischemic attack (TIA), and cerebral infarction without residual deficits: Secondary | ICD-10-CM | POA: Insufficient documentation

## 2022-05-28 DIAGNOSIS — R519 Headache, unspecified: Secondary | ICD-10-CM | POA: Insufficient documentation

## 2022-05-28 DIAGNOSIS — K219 Gastro-esophageal reflux disease without esophagitis: Secondary | ICD-10-CM | POA: Insufficient documentation

## 2022-05-28 DIAGNOSIS — M7522 Bicipital tendinitis, left shoulder: Secondary | ICD-10-CM | POA: Insufficient documentation

## 2022-05-28 DIAGNOSIS — M24112 Other articular cartilage disorders, left shoulder: Secondary | ICD-10-CM | POA: Diagnosis not present

## 2022-05-28 DIAGNOSIS — I10 Essential (primary) hypertension: Secondary | ICD-10-CM | POA: Insufficient documentation

## 2022-05-28 DIAGNOSIS — M1990 Unspecified osteoarthritis, unspecified site: Secondary | ICD-10-CM | POA: Diagnosis not present

## 2022-05-28 HISTORY — PX: SHOULDER ARTHROSCOPY WITH SUBACROMIAL DECOMPRESSION, ROTATOR CUFF REPAIR AND BICEP TENDON REPAIR: SHX5687

## 2022-05-28 SURGERY — SHOULDER ARTHROSCOPY WITH SUBACROMIAL DECOMPRESSION, ROTATOR CUFF REPAIR AND BICEP TENDON REPAIR
Anesthesia: General | Site: Shoulder | Laterality: Left

## 2022-05-28 MED ORDER — CEFAZOLIN SODIUM-DEXTROSE 2-4 GM/100ML-% IV SOLN
INTRAVENOUS | Status: AC
  Filled 2022-05-28: qty 100

## 2022-05-28 MED ORDER — BUPIVACAINE LIPOSOME 1.3 % IJ SUSP: INTRAMUSCULAR | Status: DC | PRN

## 2022-05-28 MED ORDER — METOPROLOL TARTRATE 5 MG/5ML IV SOLN
INTRAVENOUS | Status: AC
  Filled 2022-05-28: qty 5

## 2022-05-28 MED ORDER — DEXAMETHASONE SODIUM PHOSPHATE 10 MG/ML IJ SOLN
INTRAMUSCULAR | Status: AC
  Filled 2022-05-28: qty 1

## 2022-05-28 MED ORDER — LIDOCAINE HCL (CARDIAC) PF 100 MG/5ML IV SOSY
PREFILLED_SYRINGE | INTRAVENOUS | Status: DC | PRN
  Administered 2022-05-28: 100 mg via INTRAVENOUS

## 2022-05-28 MED ORDER — METOCLOPRAMIDE HCL 10 MG PO TABS: 5.0000 mg | ORAL_TABLET | Freq: Three times a day (TID) | ORAL | Status: DC | PRN

## 2022-05-28 MED ORDER — MIDAZOLAM HCL 2 MG/2ML IJ SOLN
INTRAMUSCULAR | Status: AC
  Filled 2022-05-28: qty 2

## 2022-05-28 MED ORDER — ACETAMINOPHEN 10 MG/ML IV SOLN
INTRAVENOUS | Status: AC
  Filled 2022-05-28: qty 100

## 2022-05-28 MED ORDER — METOPROLOL TARTRATE 5 MG/5ML IV SOLN
INTRAVENOUS | Status: DC | PRN
  Administered 2022-05-28: 2 mg via INTRAVENOUS

## 2022-05-28 MED ORDER — LIDOCAINE HCL (PF) 2 % IJ SOLN
INTRAMUSCULAR | Status: AC
  Filled 2022-05-28: qty 5

## 2022-05-28 MED ORDER — CHLORHEXIDINE GLUCONATE 0.12 % MT SOLN
OROMUCOSAL | Status: AC
  Administered 2022-05-28: 15 mL via OROMUCOSAL
  Filled 2022-05-28: qty 15

## 2022-05-28 MED ORDER — PHENYLEPHRINE HCL (PRESSORS) 10 MG/ML IV SOLN
INTRAVENOUS | Status: DC | PRN
  Administered 2022-05-28 (×3): 80 ug via INTRAVENOUS

## 2022-05-28 MED ORDER — ONDANSETRON HCL 4 MG/2ML IJ SOLN
INTRAMUSCULAR | Status: DC | PRN
  Administered 2022-05-28: 4 mg via INTRAVENOUS

## 2022-05-28 MED ORDER — ACETAMINOPHEN 10 MG/ML IV SOLN
INTRAVENOUS | Status: DC | PRN
  Administered 2022-05-28: 1000 mg via INTRAVENOUS

## 2022-05-28 MED ORDER — DEXMEDETOMIDINE HCL IN NACL 200 MCG/50ML IV SOLN
INTRAVENOUS | Status: DC | PRN
  Administered 2022-05-28: 8 ug via INTRAVENOUS

## 2022-05-28 MED ORDER — EPINEPHRINE PF 1 MG/ML IJ SOLN
INTRAMUSCULAR | Status: AC
  Filled 2022-05-28: qty 1

## 2022-05-28 MED ORDER — PROPOFOL 10 MG/ML IV BOLUS
INTRAVENOUS | Status: DC | PRN
  Administered 2022-05-28: 150 mg via INTRAVENOUS

## 2022-05-28 MED ORDER — ONDANSETRON HCL 4 MG/2ML IJ SOLN
INTRAMUSCULAR | Status: AC
  Filled 2022-05-28: qty 2

## 2022-05-28 MED ORDER — BUPIVACAINE LIPOSOME 1.3 % IJ SUSP
INTRAMUSCULAR | Status: AC
  Filled 2022-05-28: qty 20

## 2022-05-28 MED ORDER — DEXMEDETOMIDINE HCL IN NACL 80 MCG/20ML IV SOLN
INTRAVENOUS | Status: AC
  Filled 2022-05-28: qty 20

## 2022-05-28 MED ORDER — DEXAMETHASONE SODIUM PHOSPHATE 10 MG/ML IJ SOLN
INTRAMUSCULAR | Status: DC | PRN
  Administered 2022-05-28: 10 mg via INTRAVENOUS

## 2022-05-28 MED ORDER — OXYCODONE HCL 5 MG/5ML PO SOLN: 5.0000 mg | Freq: Once | ORAL | Status: DC | PRN

## 2022-05-28 MED ORDER — LACTATED RINGERS IV SOLN
INTRAVENOUS | Status: DC | PRN
  Administered 2022-05-28: 3000 mL

## 2022-05-28 MED ORDER — OXYCODONE HCL 5 MG PO TABS: 5.0000 mg | ORAL_TABLET | ORAL | 0 refills | Status: DC | PRN

## 2022-05-28 MED ORDER — SUGAMMADEX SODIUM 200 MG/2ML IV SOLN
INTRAVENOUS | Status: DC | PRN
  Administered 2022-05-28: 350 mg via INTRAVENOUS

## 2022-05-28 MED ORDER — METOCLOPRAMIDE HCL 5 MG/ML IJ SOLN: 5.0000 mg | Freq: Three times a day (TID) | INTRAMUSCULAR | Status: DC | PRN

## 2022-05-28 MED ORDER — FENTANYL CITRATE (PF) 100 MCG/2ML IJ SOLN
INTRAMUSCULAR | Status: AC
  Filled 2022-05-28: qty 2

## 2022-05-28 MED ORDER — OXYCODONE HCL 5 MG PO TABS
10.0000 mg | ORAL_TABLET | ORAL | Status: DC | PRN
  Administered 2022-05-28: 10 mg via ORAL

## 2022-05-28 MED ORDER — HYDROMORPHONE HCL 1 MG/ML IJ SOLN
INTRAMUSCULAR | Status: AC
  Administered 2022-05-28: 0.5 mg via INTRAVENOUS
  Filled 2022-05-28: qty 1

## 2022-05-28 MED ORDER — PHENYLEPHRINE 80 MCG/ML (10ML) SYRINGE FOR IV PUSH (FOR BLOOD PRESSURE SUPPORT)
PREFILLED_SYRINGE | INTRAVENOUS | Status: AC
  Filled 2022-05-28: qty 10

## 2022-05-28 MED ORDER — BUPIVACAINE-EPINEPHRINE 0.5% -1:200000 IJ SOLN
INTRAMUSCULAR | Status: DC | PRN
  Administered 2022-05-28: 30 mL

## 2022-05-28 MED ORDER — 0.9 % SODIUM CHLORIDE (POUR BTL) OPTIME
TOPICAL | Status: DC | PRN
  Administered 2022-05-28: 150 mL

## 2022-05-28 MED ORDER — KETAMINE HCL 10 MG/ML IJ SOLN
INTRAMUSCULAR | Status: DC | PRN
  Administered 2022-05-28: 20 mg via INTRAVENOUS

## 2022-05-28 MED ORDER — PROPOFOL 10 MG/ML IV BOLUS
INTRAVENOUS | Status: AC
  Filled 2022-05-28: qty 20

## 2022-05-28 MED ORDER — ONDANSETRON HCL 4 MG PO TABS: 4.0000 mg | ORAL_TABLET | Freq: Four times a day (QID) | ORAL | Status: DC | PRN

## 2022-05-28 MED ORDER — SODIUM CHLORIDE 0.9 % IV SOLN: INTRAVENOUS | Status: DC

## 2022-05-28 MED ORDER — OXYCODONE HCL 5 MG PO TABS: 5.0000 mg | ORAL_TABLET | Freq: Once | ORAL | Status: DC | PRN

## 2022-05-28 MED ORDER — MIDAZOLAM HCL 2 MG/2ML IJ SOLN
INTRAMUSCULAR | Status: DC | PRN
  Administered 2022-05-28: 2 mg via INTRAVENOUS

## 2022-05-28 MED ORDER — KETAMINE HCL 50 MG/5ML IJ SOSY
PREFILLED_SYRINGE | INTRAMUSCULAR | Status: AC
  Filled 2022-05-28: qty 5

## 2022-05-28 MED ORDER — ONDANSETRON HCL 4 MG/2ML IJ SOLN: 4.0000 mg | Freq: Four times a day (QID) | INTRAMUSCULAR | Status: DC | PRN

## 2022-05-28 MED ORDER — ROCURONIUM BROMIDE 100 MG/10ML IV SOLN
INTRAVENOUS | Status: DC | PRN
  Administered 2022-05-28: 70 mg via INTRAVENOUS

## 2022-05-28 MED ORDER — FENTANYL CITRATE (PF) 100 MCG/2ML IJ SOLN: 25.0000 ug | INTRAMUSCULAR | Status: DC | PRN

## 2022-05-28 MED ORDER — SUGAMMADEX SODIUM 500 MG/5ML IV SOLN
INTRAVENOUS | Status: AC
  Filled 2022-05-28: qty 5

## 2022-05-28 MED ORDER — FENTANYL CITRATE (PF) 100 MCG/2ML IJ SOLN
INTRAMUSCULAR | Status: DC | PRN
  Administered 2022-05-28 (×4): 50 ug via INTRAVENOUS

## 2022-05-28 MED ORDER — BUPIVACAINE LIPOSOME 1.3 % IJ SUSP
INTRAMUSCULAR | Status: DC | PRN
  Administered 2022-05-28: 20 mL

## 2022-05-28 MED ORDER — HYDROMORPHONE HCL 1 MG/ML IJ SOLN
0.5000 mg | INTRAMUSCULAR | Status: AC | PRN
  Administered 2022-05-28 (×2): 0.5 mg via INTRAVENOUS

## 2022-05-28 MED ORDER — BUPIVACAINE-EPINEPHRINE (PF) 0.5% -1:200000 IJ SOLN
INTRAMUSCULAR | Status: AC
  Filled 2022-05-28: qty 30

## 2022-05-28 MED ORDER — OXYCODONE HCL 5 MG PO TABS
ORAL_TABLET | ORAL | Status: AC
  Filled 2022-05-28: qty 2

## 2022-05-28 MED ORDER — ROCURONIUM BROMIDE 10 MG/ML (PF) SYRINGE
PREFILLED_SYRINGE | INTRAVENOUS | Status: AC
  Filled 2022-05-28: qty 10

## 2022-05-28 SURGICAL SUPPLY — 58 items
ANCH SUT 2 2.9 2 LD TPR NDL (Anchor) ×1 IMPLANT
ANCH SUT BN ASCP DLV (Anchor) ×1 IMPLANT
ANCH SUT RGNRT REGENETEN (Staple) ×1 IMPLANT
ANCHOR BONE REGENETEN (Anchor) IMPLANT
ANCHOR JUGGERKNOT WTAP NDL 2.9 (Anchor) IMPLANT
ANCHOR TENDON REGENETEN (Staple) IMPLANT
APL PRP STRL LF DISP 70% ISPRP (MISCELLANEOUS) ×1
BIT DRILL JUGRKNT W/NDL BIT2.9 (DRILL) IMPLANT
BLADE FULL RADIUS 3.5 (BLADE) ×1 IMPLANT
BUR ACROMIONIZER 4.0 (BURR) ×1 IMPLANT
CANNULA SHAVER 8MMX76MM (CANNULA) ×1 IMPLANT
CHLORAPREP W/TINT 26 (MISCELLANEOUS) ×1 IMPLANT
COVER MAYO STAND REUSABLE (DRAPES) ×1 IMPLANT
DILATOR 5.5 THREADED HEALICOIL (MISCELLANEOUS) IMPLANT
DRILL JUGGERKNOT W/NDL BIT 2.9 (DRILL) ×1
ELECT CAUTERY BLADE 6.4 (BLADE) ×1 IMPLANT
ELECT REM PT RETURN 9FT ADLT (ELECTROSURGICAL) ×1
ELECTRODE REM PT RTRN 9FT ADLT (ELECTROSURGICAL) ×1 IMPLANT
GAUZE SPONGE 4X4 12PLY STRL (GAUZE/BANDAGES/DRESSINGS) ×1 IMPLANT
GAUZE XEROFORM 1X8 LF (GAUZE/BANDAGES/DRESSINGS) ×1 IMPLANT
GLOVE BIO SURGEON STRL SZ7.5 (GLOVE) ×2 IMPLANT
GLOVE BIO SURGEON STRL SZ8 (GLOVE) ×2 IMPLANT
GLOVE BIOGEL PI IND STRL 8 (GLOVE) ×1 IMPLANT
GLOVE SURG UNDER LTX SZ8 (GLOVE) ×1 IMPLANT
GOWN STRL REUS W/ TWL LRG LVL3 (GOWN DISPOSABLE) ×1 IMPLANT
GOWN STRL REUS W/ TWL XL LVL3 (GOWN DISPOSABLE) ×1 IMPLANT
GOWN STRL REUS W/TWL LRG LVL3 (GOWN DISPOSABLE) ×1
GOWN STRL REUS W/TWL XL LVL3 (GOWN DISPOSABLE) ×1
GRASPER SUT 15 45D LOW PRO (SUTURE) IMPLANT
IMPL REGENETEN MEDIUM (Shoulder) IMPLANT
IMPLANT REGENETEN MEDIUM (Shoulder) ×1 IMPLANT
IV LACTATED RINGER IRRG 3000ML (IV SOLUTION) ×2
IV LR IRRIG 3000ML ARTHROMATIC (IV SOLUTION) ×2 IMPLANT
KIT CANNULA 8X76-LX IN CANNULA (CANNULA) IMPLANT
MANIFOLD NEPTUNE II (INSTRUMENTS) ×2 IMPLANT
MASK FACE SPIDER DISP (MASK) ×1 IMPLANT
MAT ABSORB  FLUID 56X50 GRAY (MISCELLANEOUS) ×1
MAT ABSORB FLUID 56X50 GRAY (MISCELLANEOUS) ×1 IMPLANT
PACK ARTHROSCOPY SHOULDER (MISCELLANEOUS) ×1 IMPLANT
PAD ABD DERMACEA PRESS 5X9 (GAUZE/BANDAGES/DRESSINGS) ×2 IMPLANT
PASSER SUT FIRSTPASS SELF (INSTRUMENTS) IMPLANT
SLEEVE REMOTE CONTROL 5X12 (DRAPES) IMPLANT
SLING ARM LRG DEEP (SOFTGOODS) ×1 IMPLANT
SLING ULTRA II LG (MISCELLANEOUS) ×1 IMPLANT
SLING ULTRA II M (MISCELLANEOUS) IMPLANT
SPONGE T-LAP 18X18 ~~LOC~~+RFID (SPONGE) ×1 IMPLANT
STAPLER SKIN PROX 35W (STAPLE) ×1 IMPLANT
STRAP SAFETY 5IN WIDE (MISCELLANEOUS) ×1 IMPLANT
SUT ETHIBOND 0 MO6 C/R (SUTURE) ×1 IMPLANT
SUT ULTRABRAID 2 COBRAID 38 (SUTURE) IMPLANT
SUT VIC AB 2-0 CT1 27 (SUTURE) ×2
SUT VIC AB 2-0 CT1 TAPERPNT 27 (SUTURE) ×2 IMPLANT
TAPE MICROFOAM 4IN (TAPE) ×1 IMPLANT
TRAP FLUID SMOKE EVACUATOR (MISCELLANEOUS) ×1 IMPLANT
TUBING CONNECTING 10 (TUBING) ×1 IMPLANT
TUBING INFLOW SET DBFLO PUMP (TUBING) ×1 IMPLANT
WAND WEREWOLF FLOW 90D (MISCELLANEOUS) ×1 IMPLANT
WATER STERILE IRR 500ML POUR (IV SOLUTION) ×1 IMPLANT

## 2022-05-28 NOTE — H&P (Signed)
History of Present Illness: Sara Munoz is a 56 y.o. female here today for follow-up of her left shoulder pain. The patient states she has had problems with her left shoulder for quite a while now. She thought it was a pulled muscle, but she does not think it is. She has not had any treatment for it. She states it has been bothering her significantly, and she can not lift her arm up. A left shoulder MRI was performed on 05/11/2022, and comes in to discuss those results.   I have reviewed past medical, surgical, social and family history, and allergies as documented in the EMR.  Past Medical History: Anemia  Chronic constipation  Chronic hip pain  Chronic nausea  Chronic pain syndrome  Chronic radicular lumbar pain  COPD (chronic obstructive pulmonary disease) (CMS-HCC)  Coronary artery disease  Costochondritis  GERD (gastroesophageal reflux disease)  Hyperlipidemia  Hypertension  Liver disease  MI (myocardial infarction) (CMS-HCC)  Narcotic abuse (CMS-HCC)  Narcotic dependence (CMS-HCC)  Neurogenic pain  Osteoarthritis  Polysubstance (excluding opioids) dependence (CMS-HCC)  PVD (peripheral vascular disease) (CMS-HCC)  Sleep apnea  TIA (transient ischemic attack)   Past Surgical History: CARDIAC CATHETERIZATION  TIBULA  UNLISTED PROCEDURE FEMUR/KNEE  LEFT   Past Family History: Cancer Mother  Cancer Father   Medications: albuterol 90 mcg/actuation inhaler Inhale into the lungs.  amLODIPine (NORVASC) 5 MG tablet Take 5 mg by mouth once daily  aspirin 81 MG EC tablet Take by mouth.  atorvastatin (LIPITOR) 20 MG tablet Take by mouth  budesonide-formoterol (SYMBICORT) 160-4.5 mcg/actuation inhaler Inhale into the lungs.  erenumab-aooe (AIMOVIG AUTOINJECTOR) 140 mg/mL AtIn Inject 140 mg subcutaneously every 3 (three) months  gabapentin (NEURONTIN) 600 MG tablet Take 600 mg by mouth 3 (three) times daily  lactulose (ENULOSE) 10 gram/15 mL oral solution Take 30 mLs by mouth 4  (four) times daily  LINZESS 290 mcg capsule Take 290 mcg by mouth once daily  lisinopril (PRINIVIL,ZESTRIL) 40 MG tablet Take 40 mg by mouth  metoprolol tartrate (LOPRESSOR) 25 MG tablet Take by mouth.  mometasone-formoterol (DULERA) 100-5 mcg/actuation inhaler Inhale 2 inhalations into the lungs 2 (two) times daily  naloxone 0.4 mg/0.4 mL AtIn Inject as directed  nicotine (NICODERM CQ) 7 mg/24 hr patch Place 1 patch onto the skin daily  nitroGLYcerin (NITROSTAT) 0.4 MG SL tablet Place 1 tablet (0.4 mg total) under the tongue every 5 (five) minutes as needed for Chest pain For up to 3 doses 25 tablet 0  omeprazole (PRILOSEC) 40 MG DR capsule Take 40 mg by mouth once daily  ondansetron (ZOFRAN) 8 MG tablet Take 8 mg by mouth every 8 (eight) hours as needed  oxyCODONE (ROXICODONE) 15 MG immediate release tablet Take 15 mg by mouth every 4 (four) hours as needed for Pain  potassium chloride (KLOR-CON) 20 mEq packet Take by mouth  ranolazine (RANEXA) 500 MG ER tablet Take by mouth  sodium zirconium cyclosilicate (LOKELMA) 5 gram packet Take 5 g by mouth daily  SPIRIVA RESPIMAT 2.5 mcg/actuation inhalation spray TAKE 1 PUFF BY MOUTH EVERY DAY  sucralfate (CARAFATE) 100 mg/mL suspension Take 1 g by mouth 4 (four) times daily  SUMAtriptan (IMITREX) 25 MG tablet Take 25 mg by mouth once as needed Migraines  tiotropium (SPIRIVA) 18 mcg inhalation capsule Place 18 mcg into inhaler and inhale once daily  ondansetron (ZOFRAN-ODT) 8 MG disintegrating tablet Take by mouth (Patient not taking: Reported on 05/13/2022)  oxyCODONE (DAZIDOX) 10 mg immediate release tablet (Patient not taking: Reported  on 05/13/2022)  tiZANidine (ZANAFLEX) 4 MG tablet Take 4 mg by mouth every 8 (eight) hours (Patient not taking: Reported on 03/15/2022)   Allergies: Cephalexin Hives  Chlorpheniramine Maleate Unknown  Cyclobenzaprine (Hives, Swelling, and Facial/lip swelling)  Ketorolac Tromethamine Other (Swelling and  Facial/tongue swelling)  Levofloxacin Hives  Phenylephrine Hcl Unknown  Sertraline Hcl Other (Swelling and Tongue swelling)  Tramadol Hives, Other (Swelling and Lip swelling)   Review of Systems: A comprehensive 14 point ROS was performed, reviewed, and the pertinent orthopaedic findings are documented in the HPI.  Physical Examination:  Vitals:  05/13/22 1315  BP: 116/68  Body mass index is 32.96 kg/m.  General/Constitutional: No apparent distress: well-nourished and well developed. Eyes: Pupils equal, round with synchronous movement. Lungs: Clear to auscultation HEENT: Normal Vascular: No edema, swelling or tenderness, except as noted in detailed exam. Cardiac: Heart rate and rhythm is regular. Integumentary: No impressive skin lesions present, except as noted in detailed exam. Neuro/Psych: Normal mood and affect, oriented to person, place and time.  On exam, she has marked weakness to external rotation strength testing. Active external rotation to 20 degrees and passive external rotation to 40 degrees on the left. Forward flexion to 160 degrees, abduction to 120 degrees with pain.  MRI scan left shoulder: 1. Minimal bursal sided degenerative fraying of the anterior supraspinatus tendon footprint. 2. Mild-to-moderate infraspinatus tendinosis. 3. 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 No full-thickness tendon tear or tendon retraction. 4. Mild degenerative changes of the acromioclavicular joint. 5. Mild subacromial/subdeltoid bursitis.  Assessment: 1. Shoulder impingement M25.819  2. Biceps tendinitis of left upper extremity M75.22  3. Partial nontraumatic rupture of left rotator cuff M75.112   Plan: The patient has clinical findings of partial possible full-thickness tear of the supraspinatus, possible SLAP lesion. Biceps tendon is inflamed and partially split.  We reviewed MRI of the left  shoulder with Dr. Roland Rack, which demonstrated partial possible full-thickness tear of the supraspinatus, possible SLAP lesion. Biceps tendon is inflamed and partially split. We will plan for arthroscopy of the left shoulder with subacromial decompression, biceps tenodesis, and rotator cuff repair. Sling for postoperative was ordered, dispensed and applied today.  Surgical Risks:  The nature of the condition and the proposed procedure has been reviewed in detail with the patient. Surgical versus non-surgical options and prognosis for recovery have been reviewed and the inherent risks and benefits of each have been discussed including the risks of infection, bleeding, injury to nerves/blood vessels/tendons, incomplete relief of symptoms, persisting pain and/or stiffness, loss of function, complex regional pain syndrome, failure of the procedure, as appropriate.    H&P reviewed and patient re-examined. No changes.

## 2022-05-28 NOTE — Op Note (Signed)
05/28/2022  12:03 PM  Patient:   Sara Munoz  Pre-Op Diagnosis:   Impingement/tendinopathy with partial-thickness rotator cuff tear and biceps tendinopathy, left shoulder.  Post-Op Diagnosis:   Impingement/tendinopathy with rotator cuff tear, type I SLAP tear, and biceps tendinopathy, left shoulder.  Procedure:   Extensive arthroscopic debridement, arthroscopic subacromial decompression, mini-open rotator cuff repair using a [employer] Regeneten patch, and mini-open biceps tenodesis, left shoulder.  Anesthesia:   GET  Surgeon:   Pascal Lux, MD  Assistant:   Cameron Proud, [name]  Findings:   As above.  There was an articular sided partial-thickness tear of the anterior insertional fibers of the supraspinatus tendon involving approximately 15% of the footprint.  The remainder of the rotator cuff was in satisfactory condition.  There was extensive tendinopathic changes of the intra-articular portion of the long head of the biceps tendon with partial-thickness tearing.  The superior portion of the labrum demonstrated type I tearing without frank detachment from the glenoid rim.  The articular surfaces of both the glenoid and humerus were in satisfactory condition.  Complications:   None  Fluids:   250 cc  Estimated blood loss:   25 cc  Tourniquet time:   None  Drains:   None  Closure:   Staples      Brief clinical note:   The patient is a 56 year old female with a history of left shoulder pain. The patient's symptoms have progressed despite medications, activity modification, etc. The patient's history and examination are consistent with impingement/tendinopathy with a rotator cuff tear. A preoperative MRI scan confirmed the presence of a partial-thickness rotator cuff tear as well as significant tendinopathic changes of the long head of the biceps tendon. The patient presents at this time for definitive management of these shoulder symptoms.  Procedure:   The patient refused  an interscalene block by the anesthesiologist in the preoperative holding area.  The patient was then brought into the operating room and lain in the supine position. The patient then underwent general endotracheal intubation and anesthesia before being repositioned in the beach chair position using the beach chair positioner. The left shoulder and upper extremity were prepped with ChloraPrep solution before being draped sterilely. Preoperative antibiotics were administered. A timeout was performed to confirm the proper surgical site before the expected portal sites and incision site were injected with 0.5% Sensorcaine with epinephrine.   A posterior portal was created and the glenohumeral joint thoroughly inspected with the findings as described above. An anterior portal was created using an outside-in technique. The labrum and rotator cuff were further probed, again confirming the above-noted findings. The areas of labral fraying and synovitis were debrided back to stable margins using the full-radius resector. In addition, the area of partial-thickness tearing involving the supraspinatus tendon also was debrided back to stable margins using the full-radius resector. The ArthroCare wand was inserted and used to release the biceps tendon from its labral anchor.  It also was used to obtain hemostasis as well as to "anneal" the labrum superiorly. The instruments were removed from the joint after suctioning the excess fluid.  The camera was repositioned through the posterior portal into the subacromial space. A separate lateral portal was created using an outside-in technique. The 3.5 mm full-radius resector was introduced and used to perform a subtotal bursectomy. The ArthroCare wand was then inserted and used to remove the periosteal tissue off the undersurface of the anterior third of the acromion as well as to recess the coracoacromial  ligament from its attachment along the anterior and lateral margins of the  acromion. The 4.0 mm acromionizing bur was introduced and used to complete the decompression by removing the undersurface of the anterior third of the acromion. The full radius resector was reintroduced to remove any residual bony debris before the ArthroCare wand was reintroduced to obtain hemostasis. The instruments were then removed from the subacromial space after suctioning the excess fluid.  An approximately 4-5 cm incision was made over the anterolateral aspect of the shoulder beginning at the anterolateral corner of the acromion and extending distally in line with the bicipital groove. This incision was carried down through the subcutaneous tissues to expose the deltoid fascia. The raphae between the anterior and middle thirds was identified and this plane developed to provide access into the subacromial space. Additional bursal tissues were debrided sharply using Metzenbaum scissors. The rotator cuff was carefully inspected. There was an area of abrasion on the bursal surface of the anterior insertional fibers of the supraspinatus, corresponding to the area overlying the articular sided partial-thickness tear. Therefore, it was elected to proceed with the application of a [place] patch in order to optimize the integrity of this portion of the rotator cuff tendon. A medium sized patch was selected and secured over the supraspinatus insertional fibers using the appropriate bone and soft tissue staples. An apparent watertight closure was obtained.  The bicipital groove was identified by palpation and opened for 1-1.5 cm. The biceps tendon stump was retrieved through this defect. The floor of the bicipital groove was roughened with a curet before a Biomet 2.9 mm JuggerKnot anchor was inserted. Both sets of sutures were passed through the biceps tendon and tied securely to effect the tenodesis. The bicipital sheath was reapproximated using two #0 Ethibond interrupted sutures, incorporating  the biceps tendon to further reinforce the tenodesis.  The wound was copiously irrigated with sterile saline solution before the deltoid raphae was reapproximated using 2-0 Vicryl interrupted sutures. The subcutaneous tissues were closed in two layers using 2-0 Vicryl interrupted sutures before the skin was closed using staples. The portal sites also were closed using staples. A sterile bulky dressing was applied to the shoulder before the arm was placed into a shoulder immobilizer. The patient was then awakened, extubated, and returned to the recovery room in satisfactory condition after tolerating the procedure well.

## 2022-05-28 NOTE — Anesthesia Postprocedure Evaluation (Signed)
Anesthesia Post Note  Patient: Sara Munoz  Procedure(s) Performed: SHOULDER ARTHROSCOPY WITH DEBRIDEMENT, DECOMPRESSION, ROTATOR CUFF REPAIR AND BICEP TENDON REPAIR (Left: Shoulder)  Patient location during evaluation: PACU Anesthesia Type: General Level of consciousness: awake and alert Pain management: pain level controlled Vital Signs Assessment: post-procedure vital signs reviewed and stable Respiratory status: spontaneous breathing, nonlabored ventilation, respiratory function stable and patient connected to nasal cannula oxygen Cardiovascular status: blood pressure returned to baseline and stable Postop Assessment: no apparent nausea or vomiting Anesthetic complications: no  No notable events documented.   Last Vitals:  Vitals:   05/28/22 1300 05/28/22 1315  BP: (!) 150/83 (!) 171/92  Pulse: (!) 57 (!) 58  Resp: 12 16  Temp: (!) 36.2 C 36.6 C  SpO2: 99% 100%    Last Pain:  Vitals:   05/28/22 1315  TempSrc: Temporal  PainSc: [place]

## 2022-05-28 NOTE — Discharge Instructions (Addendum)
Orthopedic discharge instructions: Keep dressing dry and intact.  May shower after dressing changed on post-op day #4 (Monday).  Cover staples with Band-Aids after drying off. Apply ice frequently to shoulder. Take oxycodone as prescribed when needed.  May supplement with ES Tylenol if necessary. Keep shoulder immobilizer on at all times except may remove for bathing purposes. Follow-up in 10-14 days or as scheduled.  AMBULATORY SURGERY  DISCHARGE INSTRUCTIONS   The drugs that you were given will stay in your system until tomorrow so for the next 24 hours you should not:  Drive an automobile Make any legal decisions Drink any alcoholic beverage   You may resume regular meals tomorrow.  Today it is better to start with liquids and gradually work up to solid foods.  You may eat anything you prefer, but it is better to start with liquids, then soup and crackers, and gradually work up to solid foods.   Please notify your doctor immediately if you have any unusual bleeding, trouble breathing, redness and pain at the surgery site, drainage, fever, or pain not relieved by medication.    Additional Instructions:  PLEASE LEAVE GREEN ARMBAND ON FOR 4 DAYS    Please contact your physician with any problems or Same Day Surgery at [phone], Monday through Friday 6 am to 4 pm, or [place] at [facility] number at [phone].  SHOULDER SLING IMMOBILIZER   VIDEO Slingshot 2 Shoulder Brace Application - YouTube ---[email or web address]  INSTRUCTIONS While supporting the injured arm, slide the forearm into the sling. Wrap the adjustable shoulder strap around the neck and shoulders and attach the strap end to the sling using  the "alligator strap tab."  Adjust the shoulder strap to the required length. Position the shoulder pad behind the neck. To secure the shoulder pad location (optional), pull the shoulder strap away from the shoulder pad, unfold the  hook material on the top of the pad, then press the shoulder strap back onto the hook material to secure the pad in place. Attach the closure strap across the open top of the sling. Position the strap so that it holds the arm securely in the sling. Next, attach the thumb strap to the open end of the sling between the thumb and fingers. After sling has been fit, it may be easily removed and reapplied using the quick release buckle on shoulder strap. If a neutral pillow or 15 abduction pillow is included, place the pillow at the waistline. Attach the sling to the pillow, lining up hook material on the pillow with the loop on sling. Adjust the waist strap to fit.  If waist strap is too long, cut it to fit. Use the small piece of double sided hook material (located on top of the pillow) to secure the strap end. Place the double sided hook material on the inside of the cut strap end and secure it to the waist strap.     If no pillow is included, attach the waist strap to the sling and adjust to fit.    Washing Instructions: Straps and sling must be removed and cleaned regularly depending on your activity level and perspiration. Hand wash straps and sling in cold water with mild detergent, rinse, air dry

## 2022-05-28 NOTE — Anesthesia Preprocedure Evaluation (Addendum)
Anesthesia Evaluation  Patient identified by MRN, date of birth, ID band Patient awake    Reviewed: Allergy & Precautions, NPO status , Patient's Chart, lab work & pertinent test results  History of Anesthesia Complications Negative for: history of anesthetic complications  Airway Mallampati: II  TM Distance: >3 FB Neck ROM: full    Dental  (+) Dental Advidsory Given, Edentulous Upper, Edentulous Lower   Pulmonary neg pulmonary ROS, shortness of breath and with exertion, asthma , sleep apnea , pneumonia, COPD,  COPD inhaler, Current Smoker and Patient abstained from smoking.   Pulmonary exam normal breath sounds clear to auscultation       Cardiovascular hypertension, (-) angina + CAD, + Past MI and + Peripheral Vascular Disease  negative cardio ROS Normal cardiovascular exam Rhythm:Regular Rate:Normal  Echo 09/01/19: IMPRESSIONS  1. Left ventricular ejection fraction, by estimation, is 55 to 60%. The  left ventricle has normal function. The left ventricle has no regional  wall motion abnormalities. Left ventricular diastolic parameters were  normal.  2. Right ventricular systolic function is normal. The right ventricular  size is normal. There is normal pulmonary artery systolic pressure.  3. The mitral valve is normal in structure. Mild mitral valve  regurgitation. No evidence of mitral stenosis.  4. The aortic valve is normal in structure. Aortic valve regurgitation is  not visualized. No aortic stenosis is present.  5. The inferior vena cava is normal in size with greater than 50%  respiratory variability, suggesting right atrial pressure of 3 mmHg.    Nuclear stress test 09/01/19: . Blood pressure demonstrated a normal response to exercise. . There was no ST segment deviation noted during stress. . The study is normal. . This is a low risk study. . The left ventricular ejection fraction is normal (55-65%).    Neuro/Psych  Headaches PSYCHIATRIC DISORDERS      TIA Neuromuscular disease CVA negative neurological ROS  negative psych ROS   GI/Hepatic negative GI ROS, Neg liver ROS,GERD  ,,(+) Hepatitis -  Endo/Other  negative endocrine ROS    Renal/GU negative Renal ROS     Musculoskeletal  (+) Arthritis ,    Abdominal   Peds  Hematology negative hematology ROS (+)   Anesthesia Other Findings Discussed with the patient the benefits and risks of a interscalene block. Patient stated she felt very nervous and declined the block. Discussed the possibility of increased pain without the block. Patient stated she understood and agreed.  Past Medical History: No date: Anemia No date: Anxiety No date: Asthma 08/02/2015: Atypical chest pain No date: CAD (coronary artery disease) No date: Chronic back pain No date: COPD (chronic obstructive pulmonary disease) (HCC) No date: Coronary artery disease     Comment:  a. Mild-non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/2014 No date: GERD (gastroesophageal reflux disease) No date: Hepatitis C     Comment:  per patient, "took medicines for it and no longer has               it" No date: Hypercholesteremia No date: Hypertension No date: Hypokalemia 01/04/2014: Hypomagnesemia No date: Liver disease No date: MI (myocardial infarction) ([employer]) No date: Nerve pain     Comment:  per patient, in the lower back 02/27/2016: Opiate use No date: Osteoarthritis No date: Pneumonia No date: Stroke [facility])  Past Surgical History: 03/30/2018: ANTERIOR CERVICAL DECOMP/DISCECTOMY FUSION; N/A     Comment:  Procedure: Cervical six-seven Anterior cervical  decompression/discectomy/fusion;  Surgeon: Judith Part, MD;  Location: [place];  Service: Neurosurgery;                Laterality: N/A; 01/11/2019: ANTERIOR CERVICAL DECOMP/DISCECTOMY FUSION; N/A     Comment:  Procedure: Cervical Five-Six Anterior cervical               discectomy  fusion,  Cervical Five to Cervical Seven               anterior instrumented fusion;  Surgeon: Judith Part, MD;  Location: [place];  Service: Neurosurgery;                Laterality: N/A;  Cervical Five-Six Anterior cervical               discectomy fusion,  Cervical Five to Cervical Seven               anterior instrumented fusion No date: BACK SURGERY 02/22/2016: CARDIAC CATHETERIZATION; Left     Comment:  Procedure: Left Heart Cath and Coronary Angiography;                Surgeon: Yolonda Kida, MD;  Location: [place]               CV LAB;  Service: Cardiovascular;  Laterality: Left; No date: CATARACT EXTRACTION W/ INTRAOCULAR LENS IMPLANT; Right 09/19/2016: COLONOSCOPY WITH PROPOFOL; N/A     Comment:  Procedure: COLONOSCOPY WITH PROPOFOL;  Surgeon: [name], MD;  Location: [facility] END[facility]COPY;  Service: Endoscopy;              Laterality: N/A; No date: DIAGNOSTIC LAPAROSCOPY 09/18/2017: ESOPHAGOGASTRODUODENOSCOPY (EGD) WITH PROPOFOL; N/A     Comment:  Procedure: ESOPHAGOGASTRODUODENOSCOPY (EGD) WITH               PROPOFOL;  Surgeon: [name], MD;  Location: [facility]               ENDOSCOPY;  Service: Gastroenterology;  Laterality: N/A; 08/10/2019: ESOPHAGOGASTRODUODENOSCOPY (EGD) WITH PROPOFOL; N/A     Comment:  Procedure: ESOPHAGOGASTRODUODENOSCOPY (EGD) WITH               PROPOFOL;  Surgeon: [name], MD;  Location[facility]               ENDOSCOPY;  Service: Endoscopy;  Laterality: N/A; No date: EYE SURGERY     Comment:  R eye - cataract No date: FRACTURE SURGERY No date: hemorroids No date: NASAL SINUS SURGERY No date: ORIF FEMUR FRACTURE No date: ORIF TIBIA & FIBULA FRACTURES No date: OVARY SURGERY 05/14/2021: POSTERIOR CERVICAL FUSION/FORAMINOTOMY; N/A     Comment:  Procedure: Cervical five, Cervical six               Laminectomy,foraminotomy with Cervical five-six Cervical               six-seven Cervical seven-Thoracic one  Posterior               instrumented fusion;  Surgeon: Judith Part, MD;                Location: [place];  Service: Neurosurgery;  Laterality:  N/A; 05/23/2020: TRANSFORAMINAL LUMBAR INTERBODY FUSION W/ MIS 1 LEVEL;  Right     Comment:  Procedure: Right Lumbar Five Sacral One Minimally               invasive transforaminal lumbar interbody fusion;                Surgeon: Judith Part, MD;  Location: [place];                Service: Neurosurgery;  Laterality: Right;  Right Lumbar               Five Sacral One Minimally invasive transforaminal lumbar               interbody fusion  BMI    Body Mass Index: 31.24 kg/m      Reproductive/Obstetrics negative OB ROS                             Anesthesia Physical Anesthesia Plan  ASA: 3  Anesthesia Plan: General ETT   Post-op Pain Management:    Induction: Intravenous  PONV Risk Score and Plan: 2 and Ondansetron, Dexamethasone and Midazolam  Airway Management Planned: Oral ETT  Additional Equipment:   Intra-op Plan:   Post-operative Plan: Extubation in OR  Informed Consent: I have reviewed the patients History and Physical, chart, labs and discussed the procedure including the risks, benefits and alternatives for the proposed anesthesia with the patient or authorized representative who has indicated his/her understanding and acceptance.     Dental Advisory Given  Plan Discussed with: Anesthesiologist, CRNA and Surgeon  Anesthesia Plan Comments: (Patient consented for risks of anesthesia including but not limited to:  - adverse reactions to medications - damage to eyes, teeth, lips or other oral mucosa - nerve damage due to positioning  - sore throat or hoarseness - Damage to heart, brain, nerves, lungs, other parts of body or loss of life  Patient voiced understanding.)        Anesthesia Quick Evaluation

## 2022-05-28 NOTE — Transfer of Care (Signed)
Immediate Anesthesia Transfer of Care Note  Patient: Sara Munoz  Procedure(s) Performed: SHOULDER ARTHROSCOPY WITH DEBRIDEMENT, DECOMPRESSION, ROTATOR CUFF REPAIR AND BICEP TENDON REPAIR (Left: Shoulder)  Patient Location: PACU  Anesthesia Type:General  Level of Consciousness: drowsy  Airway & Oxygen Therapy: Patient Spontanous Breathing  Post-op Assessment: Report given to RN and Post -op Vital signs reviewed and stable  Post vital signs: Reviewed and stable  Last Vitals:  Vitals Value Taken Time  BP 136/69 05/28/22 1210  Temp 36.1 C 05/28/22 1210  Pulse 74 05/28/22 1216  Resp 20 05/28/22 1216  SpO2 98 % 05/28/22 1216  Vitals shown include unvalidated device data.  Last Pain:  Vitals:   05/28/22 0940  TempSrc: Temporal  PainSc: 8          Complications: No notable events documented.

## 2022-05-28 NOTE — Anesthesia Procedure Notes (Signed)
Procedure Name: Intubation Date/Time: 05/28/2022 10:37 AM  Performed by: Cammie Sickle, CRNAPre-anesthesia Checklist: Patient identified, Patient being monitored, Timeout performed, Emergency Drugs available and Suction available Patient Re-evaluated:Patient Re-evaluated prior to induction Oxygen Delivery Method: Circle system utilized Preoxygenation: Pre-oxygenation with 100% oxygen Induction Type: IV induction Ventilation: Mask ventilation without difficulty Laryngoscope Size: 3 and McGraph Grade View: Grade I Tube type: Oral Tube size: 7.0 mm Number of attempts: 1 Airway Equipment and Method: Stylet Placement Confirmation: ETT inserted through vocal cords under direct vision, positive ETCO2 and breath sounds checked- equal and bilateral Secured at: 22 cm Tube secured with: Tape Dental Injury: Teeth and Oropharynx as per pre-operative assessment

## 2022-05-29 ENCOUNTER — Encounter: Payer: Self-pay | Admitting: Surgery

## 2022-05-31 ENCOUNTER — Ambulatory Visit: Payer: Medicaid Other | Admitting: Pulmonary Disease

## 2022-06-03 ENCOUNTER — Institutional Professional Consult (permissible substitution): Payer: Medicaid Other | Admitting: Neurology

## 2022-06-03 ENCOUNTER — Encounter: Payer: Self-pay | Admitting: Neurology

## 2022-06-03 ENCOUNTER — Telehealth: Payer: Self-pay | Admitting: Neurology

## 2022-06-03 NOTE — Telephone Encounter (Signed)
Pt had 2nd NS for sleep consultation today; please do not reschedule in sleep clinic.

## 2022-06-04 ENCOUNTER — Encounter: Payer: Self-pay | Admitting: Neurology

## 2022-06-06 ENCOUNTER — Encounter: Payer: Self-pay | Admitting: Obstetrics and Gynecology

## 2022-06-06 DIAGNOSIS — M542 Cervicalgia: Secondary | ICD-10-CM | POA: Diagnosis not present

## 2022-06-06 DIAGNOSIS — M25512 Pain in left shoulder: Secondary | ICD-10-CM | POA: Diagnosis not present

## 2022-06-06 DIAGNOSIS — M79662 Pain in left lower leg: Secondary | ICD-10-CM | POA: Diagnosis not present

## 2022-06-06 DIAGNOSIS — M5136 Other intervertebral disc degeneration, lumbar region: Secondary | ICD-10-CM | POA: Diagnosis not present

## 2022-06-06 DIAGNOSIS — M545 Low back pain, unspecified: Secondary | ICD-10-CM | POA: Diagnosis not present

## 2022-06-06 DIAGNOSIS — M79622 Pain in left upper arm: Secondary | ICD-10-CM | POA: Diagnosis not present

## 2022-06-06 DIAGNOSIS — G894 Chronic pain syndrome: Secondary | ICD-10-CM | POA: Diagnosis not present

## 2022-06-06 DIAGNOSIS — M79621 Pain in right upper arm: Secondary | ICD-10-CM | POA: Diagnosis not present

## 2022-06-06 DIAGNOSIS — M25511 Pain in right shoulder: Secondary | ICD-10-CM | POA: Diagnosis not present

## 2022-06-06 DIAGNOSIS — G890 Central pain syndrome: Secondary | ICD-10-CM | POA: Diagnosis not present

## 2022-06-07 ENCOUNTER — Ambulatory Visit: Payer: Medicaid Other | Attending: Cardiology | Admitting: Cardiology

## 2022-06-10 ENCOUNTER — Encounter: Payer: Self-pay | Admitting: Cardiology

## 2022-06-13 ENCOUNTER — Encounter: Payer: Self-pay | Admitting: Surgery

## 2022-06-14 ENCOUNTER — Other Ambulatory Visit: Payer: Medicaid Other | Admitting: Obstetrics and Gynecology

## 2022-06-14 NOTE — Patient Outreach (Signed)
Care Coordination  06/14/2022  Sara Munoz 27-Nov-1965 [id number]   Medicaid Managed Care   Unsuccessful Outreach Note  06/14/2022 Name: Sara Munoz MRN: [id number] DOB: [birth year]  Referred by: Eulis Foster, MD Reason for referral : High Risk Managed Medicaid (Unsuccessful telephone outreach)   An unsuccessful telephone outreach was attempted today. The patient was referred to the case management team for assistance with care management and care coordination.   Follow Up Plan: The care management team will reach out to the patient again over the next 30 business  days.   [name] RN, BSN [place]  [facility] - Managed Medicaid High Risk [phone]

## 2022-06-14 NOTE — Patient Instructions (Signed)
Hi Ms. Slagel, sorry I missed you today - as a part of your Medicaid benefit, you are eligible for care management and care coordination services at no cost or copay. I was unable to reach you by phone today but would be happy to help you with your health related needs. Please feel free to call me at [phone].   A member of the Managed Medicaid care management team will reach out to you again over the next 30 business days.   [name] RN, BSN [place]  [facility] - Managed Medicaid High Risk [phone]

## 2022-06-15 DIAGNOSIS — J449 Chronic obstructive pulmonary disease, unspecified: Secondary | ICD-10-CM | POA: Diagnosis not present

## 2022-06-15 DIAGNOSIS — S82209A Unspecified fracture of shaft of unspecified tibia, initial encounter for closed fracture: Secondary | ICD-10-CM | POA: Diagnosis not present

## 2022-07-04 DIAGNOSIS — M25512 Pain in left shoulder: Secondary | ICD-10-CM | POA: Diagnosis not present

## 2022-07-04 DIAGNOSIS — M25612 Stiffness of left shoulder, not elsewhere classified: Secondary | ICD-10-CM | POA: Diagnosis not present

## 2022-07-04 DIAGNOSIS — Z9889 Other specified postprocedural states: Secondary | ICD-10-CM | POA: Diagnosis not present

## 2022-07-04 DIAGNOSIS — R29898 Other symptoms and signs involving the musculoskeletal system: Secondary | ICD-10-CM | POA: Diagnosis not present

## 2022-07-04 DIAGNOSIS — M25412 Effusion, left shoulder: Secondary | ICD-10-CM | POA: Diagnosis not present

## 2022-07-05 DIAGNOSIS — M5136 Other intervertebral disc degeneration, lumbar region: Secondary | ICD-10-CM | POA: Diagnosis not present

## 2022-07-05 DIAGNOSIS — M542 Cervicalgia: Secondary | ICD-10-CM | POA: Diagnosis not present

## 2022-07-05 DIAGNOSIS — M25511 Pain in right shoulder: Secondary | ICD-10-CM | POA: Diagnosis not present

## 2022-07-05 DIAGNOSIS — G890 Central pain syndrome: Secondary | ICD-10-CM | POA: Diagnosis not present

## 2022-07-05 DIAGNOSIS — M79621 Pain in right upper arm: Secondary | ICD-10-CM | POA: Diagnosis not present

## 2022-07-05 DIAGNOSIS — M79662 Pain in left lower leg: Secondary | ICD-10-CM | POA: Diagnosis not present

## 2022-07-05 DIAGNOSIS — G894 Chronic pain syndrome: Secondary | ICD-10-CM | POA: Diagnosis not present

## 2022-07-05 DIAGNOSIS — M79622 Pain in left upper arm: Secondary | ICD-10-CM | POA: Diagnosis not present

## 2022-07-05 DIAGNOSIS — M79661 Pain in right lower leg: Secondary | ICD-10-CM | POA: Diagnosis not present

## 2022-07-05 DIAGNOSIS — M545 Low back pain, unspecified: Secondary | ICD-10-CM | POA: Diagnosis not present

## 2022-07-05 DIAGNOSIS — M25512 Pain in left shoulder: Secondary | ICD-10-CM | POA: Diagnosis not present

## 2022-07-15 ENCOUNTER — Ambulatory Visit: Payer: Medicaid Other | Admitting: Pulmonary Disease

## 2022-07-16 DIAGNOSIS — J449 Chronic obstructive pulmonary disease, unspecified: Secondary | ICD-10-CM | POA: Diagnosis not present

## 2022-07-16 DIAGNOSIS — S82209A Unspecified fracture of shaft of unspecified tibia, initial encounter for closed fracture: Secondary | ICD-10-CM | POA: Diagnosis not present

## 2022-07-18 ENCOUNTER — Other Ambulatory Visit: Payer: Medicaid Other | Admitting: Obstetrics and Gynecology

## 2022-07-18 NOTE — Patient Outreach (Signed)
  Medicaid Managed Care   Unsuccessful Attempt Note   07/18/2022 Name: Sara Munoz MRN: [id number] DOB: 1965-11-23  Referred by: Eulis Foster, MD Reason for referral : High Risk Managed Medicaid (Unsuccessful telephone outreach)   A second unsuccessful telephone outreach was attempted today. The patient was referred to the case management team for assistance with care management and care coordination.    Follow Up Plan: The Managed Medicaid care management team will reach out to the patient again over the next 30 business  days. and The  Patient has been provided with contact information for the Managed Medicaid care management team and has been advised to call with any health related questions or concerns.    [name] RN, BSN [place]  [facility] - Managed Medicaid High Risk [phone]

## 2022-07-18 NOTE — Patient Instructions (Signed)
   Hi Ms. Codd, I hope you are okay - as a part of your Medicaid benefit, you are eligible for care management and care coordination services at no cost or copay. I was unable to reach you by phone today but would be happy to help you with your health related needs. Please feel free to call me at [phone].  A member of the Managed Medicaid care management team will reach out to you again over the next 30 business  days.   [name] RN, BSN [place]  [facility] - Managed Medicaid High Risk [phone]

## 2022-07-19 ENCOUNTER — Ambulatory Visit: Payer: Medicaid Other | Attending: Cardiology | Admitting: Cardiology

## 2022-07-19 ENCOUNTER — Encounter: Payer: Self-pay | Admitting: Cardiology

## 2022-07-19 VITALS — BP 100/62 | HR 67 | Ht 64.0 in | Wt 187.2 lb

## 2022-07-19 DIAGNOSIS — G8929 Other chronic pain: Secondary | ICD-10-CM | POA: Diagnosis not present

## 2022-07-19 DIAGNOSIS — R072 Precordial pain: Secondary | ICD-10-CM | POA: Diagnosis not present

## 2022-07-19 DIAGNOSIS — M79605 Pain in left leg: Secondary | ICD-10-CM

## 2022-07-19 DIAGNOSIS — E782 Mixed hyperlipidemia: Secondary | ICD-10-CM | POA: Diagnosis not present

## 2022-07-19 DIAGNOSIS — I2510 Atherosclerotic heart disease of native coronary artery without angina pectoris: Secondary | ICD-10-CM

## 2022-07-19 DIAGNOSIS — E785 Hyperlipidemia, unspecified: Secondary | ICD-10-CM

## 2022-07-19 DIAGNOSIS — I10 Essential (primary) hypertension: Secondary | ICD-10-CM | POA: Diagnosis not present

## 2022-07-19 DIAGNOSIS — M75122 Complete rotator cuff tear or rupture of left shoulder, not specified as traumatic: Secondary | ICD-10-CM | POA: Diagnosis not present

## 2022-07-19 DIAGNOSIS — R079 Chest pain, unspecified: Secondary | ICD-10-CM | POA: Diagnosis not present

## 2022-07-19 DIAGNOSIS — M79604 Pain in right leg: Secondary | ICD-10-CM | POA: Diagnosis not present

## 2022-07-19 DIAGNOSIS — S46212A Strain of muscle, fascia and tendon of other parts of biceps, left arm, initial encounter: Secondary | ICD-10-CM | POA: Diagnosis not present

## 2022-07-19 DIAGNOSIS — F17200 Nicotine dependence, unspecified, uncomplicated: Secondary | ICD-10-CM

## 2022-07-19 DIAGNOSIS — M7552 Bursitis of left shoulder: Secondary | ICD-10-CM | POA: Diagnosis not present

## 2022-07-19 MED ORDER — ATORVASTATIN CALCIUM 40 MG PO TABS: 40.0000 mg | ORAL_TABLET | Freq: Every evening | ORAL | 3 refills | Status: DC

## 2022-07-19 MED ORDER — LISINOPRIL 20 MG PO TABS: 20.0000 mg | ORAL_TABLET | Freq: Every evening | ORAL | 36 refills | Status: DC

## 2022-07-19 MED ORDER — METOPROLOL TARTRATE 100 MG PO TABS: ORAL_TABLET | ORAL | 0 refills | Status: DC

## 2022-07-19 NOTE — Patient Instructions (Signed)
Medication Instructions:   START Atorvastatin - Take one tablet (40 mg) by mouth daily.  DECREASE Lisinopril - take one tablet ('20mg'$ ) by mouth daily.   *If you need a refill on your cardiac medications before your next appointment, please call your pharmacy*   Lab Work:  Your physician recommends you go to the medical mall to have lab work completed.   If you have labs (blood work) drawn today and your tests are completely normal, you will receive your results only by: [place] (if you have MyChart) OR A paper copy in the mail If you have any lab test that is abnormal or we need to change your treatment, we will call you to review the results.   Testing/Procedures:  Your physician has requested that you have an echocardiogram. Echocardiography is a painless test that uses sound waves to create images of your heart. It provides your doctor with information about the size and shape of your heart and how well your heart's chambers and valves are working. This procedure takes approximately one hour. There are no restrictions for this procedure. Please do NOT wear cologne, perfume, aftershave, or lotions (deodorant is allowed). Please arrive 15 minutes prior to your appointment time.  2. Your physician has requested that you have a lower extremity arterial exercise duplex. During this test, exercise and ultrasound are used to evaluate arterial blood flow in the legs. Allow one hour for this exam. There are no restrictions or special instructions.   3. Your physician has requested that you have an ankle brachial index (ABI). During this test an ultrasound and blood pressure cuff are used to evaluate the arteries that supply the arms and legs with blood. Allow thirty minutes for this exam. There are no restrictions or special instructions.     Your cardiac CT will be scheduled at [facility] [address] [place], [place] [address] [phone]  If scheduled at [facility] or [facility], please arrive 15 mins early for check-in and test prep.   Please follow these instructions carefully (unless otherwise directed):  Hold all erectile dysfunction medications at least 3 days (72 hrs) prior to test. (Ie viagra, cialis, sildenafil, tadalafil, etc) We will administer nitroglycerin during this exam.   On the Night Before the Test: Be sure to Drink plenty of water. Do not consume any caffeinated/decaffeinated beverages or chocolate 12 hours prior to your test. Do not take any antihistamines 12 hours prior to your test.  On the Day of the Test: Drink plenty of water until 1 hour prior to the test. Do not eat any food 1 hour prior to test. You may take your regular medications prior to the test.  Take metoprolol (Lopressor) two hours prior to test. FEMALES- please wear underwire-free bra if available, avoid dresses & tight clothing  After the Test: Drink plenty of water. After receiving IV contrast, you may experience a mild flushed feeling. This is normal. On occasion, you may experience a mild rash up to 24 hours after the test. This is not dangerous. If this occurs, you can take Benadryl 25 mg and increase your fluid intake. If you experience trouble breathing, this can be serious. If it is severe call 911 IMMEDIATELY. If it is mild, please call our office.  We will call to schedule your test 2-4 weeks out understanding that some insurance companies will need an authorization prior to the service being performed.   For  non-scheduling related questions, please contact the cardiac imaging nurse navigator should you have any questions/concerns: Marchia Bond, Cardiac Imaging Nurse Navigator Gordy Clement, Cardiac Imaging Nurse Navigator [facility] Direct Office Dial: [phone]   For scheduling needs, including cancellations and rescheduling,  please call [place], [phone].   Follow-Up: At [facility], you and your health needs are our priority.  As part of our continuing mission to provide you with exceptional heart care, we have created designated Provider Care Teams.  These Care Teams include your primary Cardiologist (physician) and Advanced Practice Providers (APPs -  Physician Assistants and Nurse Practitioners) who all work together to provide you with the care you need, when you need it.  We recommend signing up for the patient portal called "MyChart".  Sign up information is provided on this After Visit Summary.  MyChart is used to connect with patients for Virtual Visits (Telemedicine).  Patients are able to view lab/test results, encounter notes, upcoming appointments, etc.  Non-urgent messages can be sent to your provider as well.   To learn more about what you can do with MyChart, go to [email or web address].    Your next appointment:   8 - 10 week(s)  Provider:   You may see Kate Sable, MD or one of the following Advanced Practice Providers on your designated Care Team:   Murray Hodgkins, NP Christell Faith, [name] [place] Kathlen Mody, [name] Gerrie Nordmann, NP

## 2022-07-19 NOTE — Progress Notes (Signed)
Cardiology Office Note:    Date:  07/19/2022   ID:  Sara Munoz, DOB 08/26/1965, MRN [id number]  PCP:  Eulis Foster, [place] Providers Cardiologist:  Kate Sable, MD     Referring MD: Donnal Moat*   Chief Complaint  Patient presents with   New Patient (Initial Visit)    CAD, stenosis of carotid artery, Cardiac Hx, chest pain, L arm pain    History of Present Illness:    Sara Munoz is a 57 y.o. female with a hx of nonobstructive CAD, hypertension, hyperlipidemia current smoker x 30+ years, COPD presenting with chest pain.  Symptoms of chest pain have been ongoing for about 6 months.  Sometimes associated with exertion.  Also complains of leg pain in her thighs and calves bilaterally.  Currently off cholesterol medication carbs 6 weeks due to running out.  Compliant with aspirin as prescribed.  Prior notes/studies Myoview 08/2019 no ischemia, low risk study Echo 08/2019 EF 55 to 60% Left heart cath 2017 25% left main, 25% proximal left circumflex stenosis.  Past Medical History:  Diagnosis Date   Anemia    Anxiety    Asthma    Atypical chest pain 08/02/2015   CAD (coronary artery disease)    Chronic back pain    COPD (chronic obstructive pulmonary disease) (HCC)    Coronary artery disease    a. Mild-non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/2014   GERD (gastroesophageal reflux disease)    Hepatitis C    per patient, "took medicines for it and no longer has it"   Hypercholesteremia    Hypertension    Hypokalemia    Hypomagnesemia 01/04/2014   Liver disease    MI (myocardial infarction) ([place])    Nerve pain    per patient, in the lower back   Opiate use 02/27/2016   Osteoarthritis    Pneumonia    Stroke [facility])     Past Surgical History:  Procedure Laterality Date   ANTERIOR CERVICAL DECOMP/DISCECTOMY FUSION N/A 03/30/2018   Procedure: Cervical six-seven Anterior cervical decompression/discectomy/fusion;  Surgeon:  Judith Part, MD;  Location: [place];  Service: Neurosurgery;  Laterality: N/A;   ANTERIOR CERVICAL DECOMP/DISCECTOMY FUSION N/A 01/11/2019   Procedure: Cervical Five-Six Anterior cervical discectomy fusion,  Cervical Five to Cervical Seven anterior instrumented fusion;  Surgeon: Judith Part, MD;  Location: [place];  Service: Neurosurgery;  Laterality: N/A;  Cervical Five-Six Anterior cervical discectomy fusion,  Cervical Five to Cervical Seven anterior instrumented fusion   BACK SURGERY     CARDIAC CATHETERIZATION Left 02/22/2016   Procedure: Left Heart Cath and Coronary Angiography;  Surgeon: Yolonda Kida, MD;  Location: [place] [facility];  Service: Cardiovascular;  Laterality: Left;   CATARACT EXTRACTION W/ INTRAOCULAR LENS IMPLANT Right    COLONOSCOPY WITH PROPOFOL N/A 09/19/2016   Procedure: COLONOSCOPY WITH PROPOFOL;  Surgeon: [name], MD;  Location: [facility] ENDOSCOPY;  Service: Endoscopy;  Laterality: N/A;   DIAGNOSTIC LAPAROSCOPY     ESOPHAGOGASTRODUODENOSCOPY (EGD) WITH PROPOFOL N/A 09/18/2017   Procedure: ESOPHAGOGASTRODUODENOSCOPY (EGD) WITH PROPOFOL;  Surgeon: [name], MD;  Location: [facility] ENDOSCOPY;  Service: Gastroenterology;  Laterality: N/A;   ESOPHAGOGASTRODUODENOSCOPY (EGD) WITH PROPOFOL N/A 08/10/2019   Procedure: ESOPHAGOGASTRODUODENOSCOPY (EGD) WITH PROPOFOL;  Surgeon: [name], MD;  Location: [facility] ENDOSCOPY;  Service: Endoscopy;  Laterality: N/A;   EYE SURGERY     R eye - cataract   FRACTURE SURGERY     hemorroids  NASAL SINUS SURGERY     ORIF FEMUR FRACTURE     ORIF TIBIA & FIBULA FRACTURES     OVARY SURGERY     POSTERIOR CERVICAL FUSION/FORAMINOTOMY N/A 05/14/2021   Procedure: Cervical five, Cervical six Laminectomy,foraminotomy with Cervical five-six Cervical six-seven Cervical seven-Thoracic one Posterior instrumented fusion;  Surgeon: Judith Part, MD;  Location: [place];  Service: Neurosurgery;  Laterality: N/A;   SHOULDER  ARTHROSCOPY WITH SUBACROMIAL DECOMPRESSION, ROTATOR CUFF REPAIR AND BICEP TENDON REPAIR Left 05/28/2022   Procedure: SHOULDER ARTHROSCOPY WITH DEBRIDEMENT, DECOMPRESSION, ROTATOR CUFF REPAIR AND BICEP TENDON REPAIR;  Surgeon: Corky Mull, MD;  Location: [facility];  Service: Orthopedics;  Laterality: Left;   TRANSFORAMINAL LUMBAR INTERBODY FUSION W/ MIS 1 LEVEL Right 05/23/2020   Procedure: Right Lumbar Five Sacral One Minimally invasive transforaminal lumbar interbody fusion;  Surgeon: Judith Part, MD;  Location: [place];  Service: Neurosurgery;  Laterality: Right;  Right Lumbar Five Sacral One Minimally invasive transforaminal lumbar interbody fusion    Current Medications: Current Meds  Medication Sig   albuterol (PROVENTIL) (2.5 MG/3ML) 0.083% nebulizer solution Take 3 mLs (2.5 mg total) by nebulization every 6 (six) hours as needed for wheezing or shortness of breath.   albuterol (VENTOLIN HFA) 108 (90 Base) MCG/ACT inhaler INHALE TWO PUFFS BY MOUTH INTO LUNGS EVERY 6 HOURS AS NEEDED FOR SHORTNESS OF BREATH OR wheezing   amLODipine (NORVASC) 5 MG tablet Take 1 tablet (5 mg total) by mouth daily.   aspirin EC 81 MG tablet Take 81 mg by mouth daily. Swallow whole.   docusate sodium (COLACE) 100 MG capsule Take 100 mg by mouth 2 (two) times daily.   gabapentin (NEURONTIN) 600 MG tablet Take 600 mg by mouth 4 (four) times daily.   lactulose (CHRONULAC) 10 GM/15ML solution Take 30 mLs by mouth in the morning, at noon, in the evening, and at bedtime.   LINZESS 290 MCG CAPS capsule Take 290 mcg by mouth every morning.   metoprolol tartrate (LOPRESSOR) 100 MG tablet Take one tablet (100 mg) by mouth two hours prior to Cardiac CTA   metoprolol tartrate (LOPRESSOR) 25 MG tablet Take 1 tablet (25 mg total) by mouth 2 (two) times daily.   mometasone-formoterol (DULERA) 100-5 MCG/ACT AERO Inhale 2 puffs into the lungs daily.   naloxone (NARCAN) 4 MG/0.1ML LIQD nasal spray kit Place 1 spray into the  nose as needed (opioid overdose).   Naloxone HCl (NARCAN IJ) Inject 1 Dose as directed as needed (opioid overdose).   nicotine (NICODERM CQ - DOSED IN MG/24 HR) 7 mg/24hr patch Place 1 patch (7 mg total) onto the skin daily.   omeprazole (PRILOSEC) 40 MG capsule TAKE 1 CAPSULE BY MOUTH TWICE A DAY   ondansetron (ZOFRAN) 8 MG tablet Take 1 tablet (8 mg total) by mouth every 8 (eight) hours as needed for nausea or vomiting.   oxyCODONE (ROXICODONE) 15 MG immediate release tablet Take 15 mg by mouth.   sucralfate (CARAFATE) 1 GM/10ML suspension TAKE 10 MLS (1 G TOTAL) BY MOUTH 4 (FOUR) TIMES DAILY.   Tiotropium Bromide Monohydrate (SPIRIVA RESPIMAT) 2.5 MCG/ACT AERS INHALE 1 PUFF BY MOUTH INTO LUNGS ONCE DAILY   [DISCONTINUED] atorvastatin (LIPITOR) 20 MG tablet Take 1 tablet (20 mg total) by mouth every evening.   [DISCONTINUED] lisinopril (ZESTRIL) 40 MG tablet Take 1 tablet (40 mg total) by mouth every evening.     Allergies:   Cyclobenzaprine, Levofloxacin, Chlorpheniramine maleate, Phenergan [promethazine hcl], Phenylephrine hcl, Toradol [ketorolac tromethamine],  Zoloft [sertraline hcl], Cephalexin, Ketorolac, and Tramadol   Social History   Socioeconomic History   Marital status: Widowed    Spouse name: Not on file   Number of children: 0   Years of education: Not on file   Highest education level: 9th grade  Occupational History   Occupation: disabled  Tobacco Use   Smoking status: Every Day    Packs/day: 1.00    Years: 35.00    Total pack years: 35.00    Types: Cigarettes   Smokeless tobacco: Never   Tobacco comments:    07/2019- 1.5 a day  Vaping Use   Vaping Use: Former  Substance and Sexual Activity   Alcohol use: Not Currently    Alcohol/week: 0.0 standard drinks of alcohol   Drug use: Not Currently    Types: Marijuana    Comment: since 2009   Sexual activity: Yes    Birth control/protection: None  Other Topics Concern   Not on file  Social History Narrative    Moved from [place].   Social Determinants of Health   Financial Resource Strain: Low Risk  (04/15/2022)   Overall Financial Resource Strain (CARDIA)    Difficulty of Paying Living Expenses: Not very hard  Food Insecurity: No Food Insecurity (01/30/2022)   Hunger Vital Sign    Worried About Running Out of Food in the Last Year: Never true    Ran Out of Food in the Last Year: Never true  Transportation Needs: No Transportation Needs (04/15/2022)   PRAPARE - Hydrologist (Medical): No    Lack of Transportation (Non-Medical): No  Physical Activity: Inactive (12/24/2021)   Exercise Vital Sign    Days of Exercise per Week: 0 days    Minutes of Exercise per Session: 0 min  Stress: Stress Concern Present (05/14/2022)   [place]    Feeling of Stress : Rather much  Social Connections: Socially Isolated (11/23/2021)   Social Connection and Isolation Panel [NHANES]    Frequency of Communication with Friends and Family: More than three times a week    Frequency of Social Gatherings with Friends and Family: More than three times a week    Attends Religious Services: Never    Marine scientist or Organizations: No    Attends Archivist Meetings: Never    Marital Status: Widowed     Family History: The patient's family history includes Breast cancer in her maternal aunt; Diabetes in her maternal uncle; Healthy in her brother, brother, and brother; Heart disease in her mother; Lung cancer in her mother; Ovarian cancer in her mother.  ROS:   Please see the history of present illness.     All other systems reviewed and are negative.  EKGs/Labs/Other Studies Reviewed:    The following studies were reviewed today:   EKG:  EKG is  ordered today.  The ekg ordered today demonstrates normal sinus rhythm, heart rate 67  Recent Labs: 12/12/2021: Magnesium 2.0 04/03/2022: ALT 10; Hemoglobin  12.0; Platelets 269; TSH 2.910 04/08/2022: BUN 10; Creatinine, Ser 0.84; Potassium 5.3; Sodium 135  Recent Lipid Panel    Component Value Date/Time   CHOL 268 (H) 04/03/2022 1443   TRIG 268 (H) 04/03/2022 1443   HDL 37 (L) 04/03/2022 1443   CHOLHDL 7.2 (H) 04/03/2022 1443   CHOLHDL 5.3 08/02/2015 0606   VLDL 24 08/02/2015 0606   LDLCALC 179 (H) 04/03/2022 1443  Risk Assessment/Calculations:             Physical Exam:    VS:  BP 100/62 (BP Location: Right Arm)   Pulse 67   Ht '5\' 4"'$  (1.626 m)   Wt 187 lb 3.2 oz (84.9 kg)   LMP 12/16/2017 (Approximate)   SpO2 98%   BMI 32.13 kg/m     Wt Readings from Last 3 Encounters:  07/19/22 187 lb 3.2 oz (84.9 kg)  05/28/22 182 lb (82.6 kg)  05/27/22 182 lb (82.6 kg)     GEN:  Well nourished, well developed in no acute distress HEENT: Normal NECK: No JVD; No carotid bruits CARDIAC: RRR, no murmurs, rubs, gallops RESPIRATORY: Diminished breath sounds, no wheezing ABDOMEN: Soft, non-tender, non-distended MUSCULOSKELETAL:  No edema; No deformity  SKIN: Warm and dry NEUROLOGIC:  Alert and oriented x 3 PSYCHIATRIC:  Normal affect   ASSESSMENT:    1. Precordial pain   2. Coronary artery disease involving native heart, unspecified vessel or lesion type, unspecified whether angina present   3. Leg pain, bilateral   4. Mixed hyperlipidemia   5. Primary hypertension   6. Smoking   7. Chest pain, unspecified type   8. Hyperlipidemia, unspecified hyperlipidemia type   9. Hypertension, unspecified type    PLAN:    In order of problems listed above:  Chest pain, multiple risk factors, get echo, get coronary CTA. History of nonobstructive CAD, chest pain.  Start Lipitor 40 mg daily, continue aspirin 81.  Echo and coronary CTA as above. Leg pain bilaterally, current smoker.  Obtain ABI +/- lower extremity arterial ultrasound to evaluate PAD. Hyperlipidemia, start Lipitor 40 mg as above. Hypertension, BP low normal.  Reduce  lisinopril to 20 mg daily.  Continue Lopressor, Norvasc. Current smoker, smoking cessation advised.  Follow-up after cardiac testing       Medication Adjustments/Labs and Tests Ordered: Current medicines are reviewed at length with the patient today.  Concerns regarding medicines are outlined above.  Orders Placed This Encounter  Procedures   CT CORONARY MORPH W/CTA COR W/SCORE W/CA W/CM &/OR WO/CM   Basic Metabolic Panel (BMET)   EKG 12-Lead   ECHOCARDIOGRAM COMPLETE   VAS [place] LOWER EXT ARTERIAL PSEUDOANEURYSM COMPRESSION   VAS [place] ABI WITH/WO TBI   Meds ordered this encounter  Medications   metoprolol tartrate (LOPRESSOR) 100 MG tablet    Sig: Take one tablet (100 mg) by mouth two hours prior to Cardiac CTA    Dispense:  1 tablet    Refill:  0   atorvastatin (LIPITOR) 40 MG tablet    Sig: Take 1 tablet (40 mg total) by mouth every evening.    Dispense:  90 tablet    Refill:  3   lisinopril (ZESTRIL) 20 MG tablet    Sig: Take 1 tablet (20 mg total) by mouth every evening.    Dispense:  90 tablet    Refill:  36    Patient Instructions  Medication Instructions:   START Atorvastatin - Take one tablet (40 mg) by mouth daily.  DECREASE Lisinopril - take one tablet ('20mg'$ ) by mouth daily.   *If you need a refill on your cardiac medications before your next appointment, please call your pharmacy*   Lab Work:  Your physician recommends you go to the medical mall to have lab work completed.   If you have labs (blood work) drawn today and your tests are completely normal, you will receive your results only by: [place] (if  you have MyChart) OR A paper copy in the mail If you have any lab test that is abnormal or we need to change your treatment, we will call you to review the results.   Testing/Procedures:  Your physician has requested that you have an echocardiogram. Echocardiography is a painless test that uses sound waves to create images of your heart. It  provides your doctor with information about the size and shape of your heart and how well your heart's chambers and valves are working. This procedure takes approximately one hour. There are no restrictions for this procedure. Please do NOT wear cologne, perfume, aftershave, or lotions (deodorant is allowed). Please arrive 15 minutes prior to your appointment time.  2. Your physician has requested that you have a lower extremity arterial exercise duplex. During this test, exercise and ultrasound are used to evaluate arterial blood flow in the legs. Allow one hour for this exam. There are no restrictions or special instructions.   3. Your physician has requested that you have an ankle brachial index (ABI). During this test an ultrasound and blood pressure cuff are used to evaluate the arteries that supply the arms and legs with blood. Allow thirty minutes for this exam. There are no restrictions or special instructions.     Your cardiac CT will be scheduled at [facility] [address] [place],  [address] [phone]  If scheduled at [facility] or [facility], please arrive 15 mins early for check-in and test prep.   Please follow these instructions carefully (unless otherwise directed):  Hold all erectile dysfunction medications at least 3 days (72 hrs) prior to test. (Ie viagra, cialis, sildenafil, tadalafil, etc) We will administer nitroglycerin during this exam.   On the Night Before the Test: Be sure to Drink plenty of water. Do not consume any caffeinated/decaffeinated beverages or chocolate 12 hours prior to your test. Do not take any antihistamines 12 hours prior to your test.  On the Day of the Test: Drink plenty of water until 1 hour prior to the test. Do not eat any food 1 hour prior to test. You may take your regular medications prior to the test.  Take metoprolol  (Lopressor) two hours prior to test. FEMALES- please wear underwire-free bra if available, avoid dresses & tight clothing  After the Test: Drink plenty of water. After receiving IV contrast, you may experience a mild flushed feeling. This is normal. On occasion, you may experience a mild rash up to 24 hours after the test. This is not dangerous. If this occurs, you can take Benadryl 25 mg and increase your fluid intake. If you experience trouble breathing, this can be serious. If it is severe call 911 IMMEDIATELY. If it is mild, please call our office.  We will call to schedule your test 2-4 weeks out understanding that some insurance companies will need an authorization prior to the service being performed.   For non-scheduling related questions, please contact the cardiac imaging nurse navigator should you have any questions/concerns: Marchia Bond, Cardiac Imaging Nurse Navigator Gordy Clement, Cardiac Imaging Nurse Navigator [facility] Direct Office Dial: [phone]   For scheduling needs, including cancellations and rescheduling, please call [place], [phone].   Follow-Up: At [facility], you and your health needs are our priority.  As part of our continuing mission to provide you with exceptional heart care, we have created designated Provider Care Teams.  These Care  Teams include your primary Cardiologist (physician) and Advanced Practice Providers (APPs -  Physician Assistants and Nurse Practitioners) who all work together to provide you with the care you need, when you need it.  We recommend signing up for the patient portal called "MyChart".  Sign up information is provided on this After Visit Summary.  MyChart is used to connect with patients for Virtual Visits (Telemedicine).  Patients are able to view lab/test results, encounter notes, upcoming appointments, etc.  Non-urgent messages can be sent to your provider as well.   To learn  more about what you can do with MyChart, go to [email or web address].    Your next appointment:   8 - 10 week(s)  Provider:   You may see Kate Sable, MD or one of the following Advanced Practice Providers on your designated Care Team:   Murray Hodgkins, NP Christell Faith, [name] Cadence Kathlen Mody, [name] Gerrie Nordmann, NP   Signed, Kate Sable, MD  07/19/2022 12:33 PM    [place]

## 2022-07-29 ENCOUNTER — Telehealth: Payer: Self-pay

## 2022-07-29 NOTE — Telephone Encounter (Signed)
..   Medicaid Managed Care   Unsuccessful Outreach Note  07/29/2022 Name: Sara Munoz MRN: [id number] DOB: Nov 29, 1965  Referred by: Eulis Foster, MD Reason for referral : Appointment (I called the patient to reschedule her missed phone appointments with the [facility] RNCM. I left my name and number on her VM.)   A second unsuccessful telephone outreach was attempted today. The patient was referred to the case management team for assistance with care management and care coordination.   Follow Up Plan: The patient has been provided with contact information for the care management team and has been advised to call with any health related questions or concerns.  The care management team will reach out to the patient again over the next 7 days.   [place]

## 2022-07-30 DIAGNOSIS — M47812 Spondylosis without myelopathy or radiculopathy, cervical region: Secondary | ICD-10-CM | POA: Diagnosis not present

## 2022-08-02 DIAGNOSIS — M25511 Pain in right shoulder: Secondary | ICD-10-CM | POA: Diagnosis not present

## 2022-08-02 DIAGNOSIS — M79661 Pain in right lower leg: Secondary | ICD-10-CM | POA: Diagnosis not present

## 2022-08-02 DIAGNOSIS — M545 Low back pain, unspecified: Secondary | ICD-10-CM | POA: Diagnosis not present

## 2022-08-02 DIAGNOSIS — M79622 Pain in left upper arm: Secondary | ICD-10-CM | POA: Diagnosis not present

## 2022-08-02 DIAGNOSIS — G890 Central pain syndrome: Secondary | ICD-10-CM | POA: Diagnosis not present

## 2022-08-02 DIAGNOSIS — M542 Cervicalgia: Secondary | ICD-10-CM | POA: Diagnosis not present

## 2022-08-02 DIAGNOSIS — M961 Postlaminectomy syndrome, not elsewhere classified: Secondary | ICD-10-CM | POA: Diagnosis not present

## 2022-08-02 DIAGNOSIS — G894 Chronic pain syndrome: Secondary | ICD-10-CM | POA: Diagnosis not present

## 2022-08-02 DIAGNOSIS — M25512 Pain in left shoulder: Secondary | ICD-10-CM | POA: Diagnosis not present

## 2022-08-02 DIAGNOSIS — M79662 Pain in left lower leg: Secondary | ICD-10-CM | POA: Diagnosis not present

## 2022-08-02 DIAGNOSIS — M79621 Pain in right upper arm: Secondary | ICD-10-CM | POA: Diagnosis not present

## 2022-08-05 ENCOUNTER — Encounter: Payer: Medicaid Other | Admitting: Advanced Practice Midwife

## 2022-08-05 ENCOUNTER — Telehealth: Payer: Self-pay

## 2022-08-05 NOTE — Telephone Encounter (Signed)
..   Medicaid Managed Care   Unsuccessful Outreach Note  08/05/2022 Name: Sara Munoz MRN: [id number] DOB: [birth year]  Referred by: Eulis Foster, MD Reason for referral : Appointment   Third unsuccessful telephone outreach was attempted today. The patient was referred to the case management team for assistance with care management and care coordination. The patient's primary care provider has been notified of our unsuccessful attempts to make or maintain contact with the patient. The care management team is pleased to engage with this patient at any time in the future should he/she be interested in assistance from the care management team.   Follow Up Plan: We have been unable to make contact with the patient for follow up. The care management team is available to follow up with the patient after provider conversation with the patient regarding recommendation for care management engagement and subsequent re-referral to the care management team.   [place], [place]

## 2022-08-06 ENCOUNTER — Other Ambulatory Visit: Payer: Self-pay | Admitting: Gastroenterology

## 2022-08-06 ENCOUNTER — Other Ambulatory Visit: Payer: Self-pay | Admitting: Family Medicine

## 2022-08-06 DIAGNOSIS — I10 Essential (primary) hypertension: Secondary | ICD-10-CM

## 2022-08-07 ENCOUNTER — Other Ambulatory Visit: Payer: Medicaid Other | Admitting: Obstetrics and Gynecology

## 2022-08-07 NOTE — Progress Notes (Deleted)
Established patient visit   Patient: Sara Munoz   DOB: [birth year]   57 y.o. Female  MRN: [id number] Visit Date: 08/08/2022  Today's healthcare provider: Eulis Foster, MD   No chief complaint on file.  Subjective    Patient has hx of chronic migraines  She has reported no longer taking Aimovig or Imitrex since Jan of 2024  Today she is reporting frequent headaches that are severe and ***    Headache      Medications: Outpatient Medications Prior to Visit  Medication Sig  . albuterol (PROVENTIL) (2.5 MG/3ML) 0.083% nebulizer solution Take 3 mLs (2.5 mg total) by nebulization every 6 (six) hours as needed for wheezing or shortness of breath.  Marland Kitchen albuterol (VENTOLIN HFA) 108 (90 Base) MCG/ACT inhaler INHALE TWO PUFFS BY MOUTH INTO LUNGS EVERY 6 HOURS AS NEEDED FOR SHORTNESS OF BREATH OR wheezing  . amLODipine (NORVASC) 5 MG tablet Take 1 tablet (5 mg total) by mouth daily.  Marland Kitchen aspirin EC 81 MG tablet Take 81 mg by mouth daily. Swallow whole.  Marland Kitchen atorvastatin (LIPITOR) 40 MG tablet Take 1 tablet (40 mg total) by mouth every evening.  . docusate sodium (COLACE) 100 MG capsule Take 100 mg by mouth 2 (two) times daily.  Eduard Roux (AIMOVIG) 140 MG/ML SOAJ Inject 140 mg into the skin every 3 (three) months. (Patient not taking: Reported on 07/19/2022)  . gabapentin (NEURONTIN) 600 MG tablet Take 600 mg by mouth 4 (four) times daily.  Marland Kitchen lactulose (CHRONULAC) 10 GM/15ML solution Take 30 mLs by mouth in the morning, at noon, in the evening, and at bedtime.  Marland Kitchen LINZESS 290 MCG CAPS capsule Take 290 mcg by mouth every morning.  Marland Kitchen lisinopril (ZESTRIL) 20 MG tablet Take 1 tablet (20 mg total) by mouth every evening.  . metoprolol tartrate (LOPRESSOR) 100 MG tablet Take one tablet (100 mg) by mouth two hours prior to Cardiac CTA  . metoprolol tartrate (LOPRESSOR) 25 MG tablet TAKE ONE TABLET BY MOUTH TWICE DAILY  . mometasone-formoterol (DULERA) 100-5 MCG/ACT AERO Inhale 2  puffs into the lungs daily.  . naloxone (NARCAN) 4 MG/0.1ML LIQD nasal spray kit Place 1 spray into the nose as needed (opioid overdose).  . Naloxone HCl (NARCAN IJ) Inject 1 Dose as directed as needed (opioid overdose).  . nicotine (NICODERM CQ - DOSED IN MG/24 HR) 7 mg/24hr patch Place 1 patch (7 mg total) onto the skin daily.  Marland Kitchen omeprazole (PRILOSEC) 40 MG capsule TAKE 1 CAPSULE BY MOUTH TWICE A DAY  . ondansetron (ZOFRAN) 8 MG tablet Take 1 tablet (8 mg total) by mouth every 8 (eight) hours as needed for nausea or vomiting.  Marland Kitchen oxyCODONE (ROXICODONE) 15 MG immediate release tablet Take 15 mg by mouth.  . oxyCODONE (ROXICODONE) 5 MG immediate release tablet Take 1-2 tablets (5-10 mg total) by mouth every 4 (four) hours as needed for breakthrough pain. (Patient not taking: Reported on 07/19/2022)  . sucralfate (CARAFATE) 1 GM/10ML suspension TAKE 10 MLS (1 G TOTAL) BY MOUTH 4 (FOUR) TIMES DAILY.  . SUMAtriptan (IMITREX) 25 MG tablet Take 1 tablet by mouth as needed. (Patient not taking: Reported on 07/19/2022)  . Tiotropium Bromide Monohydrate (SPIRIVA RESPIMAT) 2.5 MCG/ACT AERS INHALE 1 PUFF BY MOUTH INTO LUNGS ONCE DAILY   No facility-administered medications prior to visit.    Review of Systems  Neurological:  Positive for headaches.   {Labs  Heme  Chem  Endocrine  Serology  Results Review (optional):23779}  Objective    LMP 12/16/2017 (Approximate)  {Show previous vital signs (optional):23777}  Physical Exam  ***  No results found for any visits on 08/08/22.  Assessment & Plan     ***  No follow-ups on file.      {provider attestation***:1}   Eulis Foster, MD  [facility] [phone] (phone) [phone] (fax)  [place]

## 2022-08-07 NOTE — Patient Instructions (Signed)
Hi Ms. Wollard  - as a part of your Medicaid benefit, you are eligible for care management and care coordination services at no cost or copay. We have been unable to reach you by phone today but would be happy to help you with your health related needs. Please feel free to call me at [phone].  [name] RN, BSN [place]  [facility] [employer] - Managed Medicaid High Risk [phone]

## 2022-08-07 NOTE — Patient Outreach (Cosign Needed)
Care Coordination  08/07/2022  Sara Munoz [birth year] [id number]   Medicaid Managed Care   Unsuccessful Outreach Note  08/07/2022 Name: Sara Munoz MRN: [id number] DOB: 09/16/1965  Referred by: Eulis Foster, MD Reason for referral : High Risk Managed Medicaid (Unsuccessful telephone outreach)   Fourth  unsuccessful telephone outreach was attempted.  The patient was referred to the case management team for assistance with care management and care coordination. The patient's primary care provider has been notified of our unsuccessful attempts to make or maintain contact with the patient. The care management team is pleased to engage with this patient at any time in the future should he/she be interested in assistance from the care management team.   Follow Up Plan: We have been unable to make contact with the patient for follow up. The care management team is available to follow up with the patient after provider conversation with the patient regarding recommendation for care management engagement and subsequent re-referral to the care management team.   [name] RN, BSN [facility] Management Coordinator - Managed [place] High Risk [phone]

## 2022-08-08 ENCOUNTER — Ambulatory Visit: Payer: Medicaid Other | Admitting: Family Medicine

## 2022-08-12 NOTE — Progress Notes (Unsigned)
Established patient visit   Patient: Sara Munoz   DOB: January 08, 1966   57 y.o. Female  MRN: [id number] Visit Date: 08/13/2022  Today's healthcare provider: Eulis Foster, MD   No chief complaint on file.  Subjective    Headache  Associated symptoms include tinnitus. Pertinent negatives include no fever.   HPI   Pt stated--bad headache-1 year due had cyst sinuses. Tried otc for headache. Last edited by Elta Guadeloupe, CMA on 08/13/2022  2:22 PM.      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  She is hoping to have a referral to neurology She reports having issues with her balance  She is having increased anxiety  Headaches feel different than her migraines  She is waking up with headache  She has been taking BC powder to relieve the headaches for a short amount of time  She has nausea chronically and is not associated with headache  HA associated with blurry vision  She reports having pain in her ears and constant ringing in her ears The headaches are localized to the top of her head and towards the forehead and sometimes radiates from the back of her head since having third surgery on her cervical spine  She reports feeling chronically weak in her upper and lower extremities Pain is describes as sharp and lasts all day  Does not appear to have episodes of HA rather has headache that lasts all day  She has lightheadedness when she stands  She denies fevers or chills, weight has been consistent with no unintentional loss  Tenderness in the occipital region of her head, she is not sure if this is related to her prior cervical spine surgeries or not Patient was discharged from [place] [facility] due to no shows, per patient report   Medications: Outpatient Medications Prior to Visit  Medication Sig   albuterol (PROVENTIL) (2.5 MG/3ML) 0.083% nebulizer solution Take 3 mLs (2.5 mg total) by nebulization every 6 (six) hours as  needed for wheezing or shortness of breath.   albuterol (VENTOLIN HFA) 108 (90 Base) MCG/ACT inhaler INHALE TWO PUFFS BY MOUTH INTO LUNGS EVERY 6 HOURS AS NEEDED FOR SHORTNESS OF BREATH OR wheezing   amLODipine (NORVASC) 5 MG tablet Take 1 tablet (5 mg total) by mouth daily.   aspirin EC 81 MG tablet Take 81 mg by mouth daily. Swallow whole.   atorvastatin (LIPITOR) 40 MG tablet Take 1 tablet (40 mg total) by mouth every evening.   docusate sodium (COLACE) 100 MG capsule Take 100 mg by mouth 2 (two) times daily.   Erenumab-aooe (AIMOVIG) 140 MG/ML SOAJ Inject 140 mg into the skin every 3 (three) months.   gabapentin (NEURONTIN) 600 MG tablet Take 600 mg by mouth 4 (four) times daily.   lactulose (CHRONULAC) 10 GM/15ML solution Take 30 mLs by mouth in the morning, at noon, in the evening, and at bedtime.   LINZESS 290 MCG CAPS capsule Take 290 mcg by mouth every morning.   lisinopril (ZESTRIL) 20 MG tablet Take 1 tablet (20 mg total) by mouth every evening.   metoprolol tartrate (LOPRESSOR) 100 MG tablet Take one tablet (100 mg) by mouth two hours prior to Cardiac CTA   metoprolol tartrate (LOPRESSOR) 25 MG tablet TAKE ONE TABLET BY MOUTH TWICE DAILY   mometasone-formoterol (DULERA) 100-5 MCG/ACT AERO Inhale 2 puffs into the lungs daily.   naloxone (NARCAN) 4 MG/0.1ML LIQD nasal  spray kit Place 1 spray into the nose as needed (opioid overdose).   Naloxone HCl (NARCAN IJ) Inject 1 Dose as directed as needed (opioid overdose).   nicotine (NICODERM CQ - DOSED IN MG/24 HR) 7 mg/24hr patch Place 1 patch (7 mg total) onto the skin daily.   omeprazole (PRILOSEC) 40 MG capsule TAKE 1 CAPSULE BY MOUTH TWICE A DAY   ondansetron (ZOFRAN) 8 MG tablet Take 1 tablet (8 mg total) by mouth every 8 (eight) hours as needed for nausea or vomiting.   oxyCODONE (ROXICODONE) 15 MG immediate release tablet Take 15 mg by mouth.   sucralfate (CARAFATE) 1 GM/10ML suspension TAKE 10 MLS (1 G TOTAL) BY MOUTH 4 (FOUR) TIMES  DAILY.   SUMAtriptan (IMITREX) 25 MG tablet Take 1 tablet by mouth as needed.   Tiotropium Bromide Monohydrate (SPIRIVA RESPIMAT) 2.5 MCG/ACT AERS INHALE 1 PUFF BY MOUTH INTO LUNGS ONCE DAILY   [DISCONTINUED] oxyCODONE (ROXICODONE) 5 MG immediate release tablet Take 1-2 tablets (5-10 mg total) by mouth every 4 (four) hours as needed for breakthrough pain. (Patient not taking: Reported on 07/19/2022)   No facility-administered medications prior to visit.    Review of Systems  Constitutional:  Negative for fever.  HENT:  Positive for tinnitus.        Head tenderness   Neurological:  Positive for headaches. Negative for facial asymmetry and light-headedness.       Objective    BP 124/77 (BP Location: Right Arm, Patient Position: Sitting, Cuff Size: Normal)   Pulse 67   Ht 5' 4"$  (1.626 m)   Wt 190 lb (86.2 kg)   LMP 12/16/2017 (Approximate)   SpO2 98%   BMI 32.61 kg/m    Physical Exam HENT:     Head: Atraumatic. No raccoon eyes, right periorbital erythema or left periorbital erythema.      Comments: She has 5/5 strength in bilateral upper extremities Pulmonary:     Effort: Pulmonary effort is normal. No respiratory distress.  Neurological:     Mental Status: She is alert and oriented to person, place, and time.     Cranial Nerves: Cranial nerves 2-12 are intact. No cranial nerve deficit, dysarthria or facial asymmetry.     Sensory: Sensation is intact.     Motor: No weakness, tremor, atrophy or abnormal muscle tone.       No results found for any visits on 08/13/22.  Assessment & Plan     Problem List Items Addressed This Visit       Other   Headache disorder - Primary    Chronic headache  Worsened  Daily, constant headache  Has been on 54m oxycodone every 4 hours with pain management for MSK related pains  Referral submitted today for neurology for further evaluation  This may be related to structural changes that she experienced previously related to a cyst            MEulis Foster MD  [phone](phone) [phone](fax)  [place]

## 2022-08-13 ENCOUNTER — Encounter: Payer: Self-pay | Admitting: Family Medicine

## 2022-08-13 ENCOUNTER — Ambulatory Visit (INDEPENDENT_AMBULATORY_CARE_PROVIDER_SITE_OTHER): Payer: Medicaid Other | Admitting: Family Medicine

## 2022-08-13 VITALS — BP 124/77 | HR 67 | Ht 64.0 in | Wt 190.0 lb

## 2022-08-13 DIAGNOSIS — R519 Headache, unspecified: Secondary | ICD-10-CM | POA: Insufficient documentation

## 2022-08-13 NOTE — Patient Instructions (Signed)
I have placed a referral to neurology urgently. Please call our office if you do not hear anything in regards to scheduling an appointment in the next 10 days  Please continue your pain medications as prescribed  You will need to be evaluated emergently if you begin to have worsening pain within the next few days, loss of vision, weakness on one side of the bod  I also received correspondence from gynecology, they state that the referral is still good for 1 year If you would like to reschedule this appointment their number is [phone]

## 2022-08-13 NOTE — Assessment & Plan Note (Signed)
Chronic headache  Worsened  Daily, constant headache  Has been on 53m oxycodone every 4 hours with pain management for MSK related pains  Referral submitted today for neurology for further evaluation  This may be related to structural changes that she experienced previously related to a cyst

## 2022-08-14 ENCOUNTER — Telehealth: Payer: Self-pay

## 2022-08-14 ENCOUNTER — Encounter: Payer: Self-pay | Admitting: Neurology

## 2022-08-14 DIAGNOSIS — J324 Chronic pansinusitis: Secondary | ICD-10-CM

## 2022-08-14 NOTE — Telephone Encounter (Signed)
Referral placed.

## 2022-08-14 NOTE — Addendum Note (Signed)
Addended by: Adolph Pollack on: 08/14/2022 03:03 PM   Modules accepted: Orders

## 2022-08-14 NOTE — Telephone Encounter (Signed)
Please Review

## 2022-08-14 NOTE — Telephone Encounter (Signed)
Copied from [place] [id number]. Topic: General - Other >> Aug 14, 2022 11:29 AM Burman Freestone wrote: Reason for CRM Pt is requesting a referral to an ENT specialist in [place]. She states that she has had trouble with her sinuses since a cyst was found. I asked for more detail but she did not say.She said it was talked about at last office visit

## 2022-08-19 ENCOUNTER — Other Ambulatory Visit: Payer: Self-pay | Admitting: Anesthesiology

## 2022-08-19 DIAGNOSIS — M961 Postlaminectomy syndrome, not elsewhere classified: Secondary | ICD-10-CM

## 2022-08-19 DIAGNOSIS — M5136 Other intervertebral disc degeneration, lumbar region: Secondary | ICD-10-CM

## 2022-08-30 ENCOUNTER — Ambulatory Visit: Admission: RE | Admit: 2022-08-30 | Payer: 59 | Source: Ambulatory Visit

## 2022-09-04 ENCOUNTER — Ambulatory Visit
Admission: RE | Admit: 2022-09-04 | Discharge: 2022-09-04 | Disposition: A | Discharge status: Home / Self Care | Payer: 59 | Source: Ambulatory Visit | Attending: Anesthesiology | Admitting: Anesthesiology

## 2022-09-04 DIAGNOSIS — M5136 Other intervertebral disc degeneration, lumbar region: Secondary | ICD-10-CM

## 2022-09-04 DIAGNOSIS — M961 Postlaminectomy syndrome, not elsewhere classified: Secondary | ICD-10-CM

## 2022-09-05 ENCOUNTER — Encounter: Payer: 59 | Admitting: Advanced Practice Midwife

## 2022-09-06 ENCOUNTER — Telehealth (HOSPITAL_COMMUNITY): Payer: Self-pay | Admitting: *Deleted

## 2022-09-06 NOTE — Telephone Encounter (Signed)
Reaching out to patient to offer assistance regarding upcoming cardiac imaging study; pt verbalizes understanding of appt date/time, parking situation and where to check in, pre-test NPO status and medications ordered, and verified current allergies; name and call back number provided for further questions should they arise  Gordy Clement RN Navigator Cardiac [place] [facility] [phone] office [phone] cell  Patient to hold her daily metoprolol tartrate but will take 100mg  metoprolol two hours prior to her cardiac CT scan.

## 2022-09-09 ENCOUNTER — Ambulatory Visit: Admission: RE | Admit: 2022-09-09 | Payer: 59 | Source: Ambulatory Visit

## 2022-09-09 ENCOUNTER — Telehealth: Payer: Self-pay

## 2022-09-09 NOTE — Telephone Encounter (Signed)
Copied from [place] [id number]. Topic: General - Inquiry >> Sep 09, 2022 11:12 AM Ludger Nutting wrote: Elta Guadeloupe with [employer] called to get a form of medical necessity and visit notes supporting patients need for a wheelchair. Please advise. Fax [phone]

## 2022-09-09 NOTE — Telephone Encounter (Signed)
Call patient to confirm that she uses a wheelchair.  Patient has had multiple visits and has not been using a wheelchair.  Please clarify if this is an actual request of the patient.  Please notify her that we have received request for form for wheelchair from [employer].  Eulis Foster, MD [facility]

## 2022-09-10 NOTE — Telephone Encounter (Signed)
Unable to leave message for patients. If the patient calls back, it's okay for [facility] nurse triage to give the patient the provider message.

## 2022-09-11 ENCOUNTER — Other Ambulatory Visit: Payer: Self-pay | Admitting: Surgery

## 2022-09-11 ENCOUNTER — Ambulatory Visit: Payer: Self-pay

## 2022-09-11 NOTE — Telephone Encounter (Signed)
t stated something broke out on her hands, and she cannot get it to go away. Has some pain and some itching unsure if it is spreading, but it is currently on her hands and wrists, going on for about two weeks. She mentioned having something similar in the past on the inside of her thighs.   Pt stated she is having surgery on April 4th and needs this gone as soon as possible.   Pt seeking clinical advice.    Chief Complaint: Red rash, skin to both hands, patchy Symptoms: Mild pain, itching Frequency: 2 weeks ago Pertinent Negatives: Patient denies  Disposition: [] ED /[] Urgent Care (no appt availability in office) / [x] Appointment(In office/virtual)/ []  [facility]/ [] Home Care/ [] Refused Recommended Disposition /[] [facility]/ []  Follow-up with PCP Additional Notes:    Reason for Disposition  Localized rash present > 7 days  Answer Assessment - Initial Assessment Questions 1. APPEARANCE of RASH: "Describe the rash."      Red patches 2. LOCATION: "Where is the rash located?"      Both hands 3. NUMBER: "How many spots are there?"      N/a 4. SIZE: "How big are the spots?" (Inches, centimeters or compare to size of a coin)      N/a 5. ONSET: "When did the rash start?"      2 weeks ago 6. ITCHING: "Does the rash itch?" If Yes, ask: "How bad is the itch?"  (Scale 0-10; or none, mild, moderate, severe)     Mild 7. PAIN: "Does the rash hurt?" If Yes, ask: "How bad is the pain?"  (Scale 0-10; or none, mild, moderate, severe)    - NONE (0): no pain    - MILD (1-3): doesn't interfere with normal activities     - MODERATE (4-7): interferes with normal activities or awakens from sleep     - SEVERE (8-10): excruciating pain, unable to do any normal activities     Mild 8. OTHER SYMPTOMS: "Do you have any other symptoms?" (e.g., fever)     No 9. PREGNANCY: "Is there any chance you are pregnant?" "When was your last menstrual period?"     No  Protocols used: Rash or  Redness - Localized-A-AH

## 2022-09-11 NOTE — Progress Notes (Unsigned)
I,J'ya E Sayed Apostol,acting as a scribe for Gwyneth Sprout, FNP.,have documented all relevant documentation on the behalf of Gwyneth Sprout, FNP,as directed by  Gwyneth Sprout, FNP while in the presence of Gwyneth Sprout, FNP.  Established patient visit  Patient: Sara Munoz   DOB: 11/09/1965   57 y.o. Female  MRN: [id number] Visit Date: 09/12/2022  Today's healthcare provider: Gwyneth Sprout, FNP  Introduced to nurse practitioner role and practice setting.  All questions answered.  Discussed provider/patient relationship and expectations.  Subjective    HPI  Rash: Patient complains of rash involving the  hands and upper thighs . Rash started 2 weeks ago. Appearance of rash at onset: Color of lesion(s): purple, Texture of lesion(s): raised. Rash has not changed over time Initial distribution: .  Discomfort associated with rash: is painful.  Associated symptoms: nausea. Denies: abdominal pain, congestion, and sore throat. Patient has not had previous evaluation of rash. Patient has not had previous treatment.  Response to treatment: OTC antibiotic ointment. Patient has had contacts with similar rash. Patient has not identified precipitant. Patient has had new exposures (soaps, lotions, laundry detergents, foods, medications, plants, insects or animals.) Patient said she used boyfriends [place] Soap.   Patient also wants to get a refill on Zofran for nausea.    Medications: Outpatient Medications Prior to Visit  Medication Sig   albuterol (PROVENTIL) (2.5 MG/3ML) 0.083% nebulizer solution Take 3 mLs (2.5 mg total) by nebulization every 6 (six) hours as needed for wheezing or shortness of breath.   albuterol (VENTOLIN HFA) 108 (90 Base) MCG/ACT inhaler INHALE TWO PUFFS BY MOUTH INTO LUNGS EVERY 6 HOURS AS NEEDED FOR SHORTNESS OF BREATH OR wheezing   amLODipine (NORVASC) 5 MG tablet Take 1 tablet (5 mg total) by mouth daily.   aspirin EC 81 MG tablet Take 81 mg by mouth daily. Swallow whole.    atorvastatin (LIPITOR) 40 MG tablet Take 1 tablet (40 mg total) by mouth every evening.   docusate sodium (COLACE) 100 MG capsule Take 100 mg by mouth 2 (two) times daily.   gabapentin (NEURONTIN) 600 MG tablet Take 600 mg by mouth 4 (four) times daily.   lactulose (CHRONULAC) 10 GM/15ML solution Take 30 mLs by mouth in the morning, at noon, in the evening, and at bedtime.   LINZESS 290 MCG CAPS capsule Take 290 mcg by mouth every morning.   lisinopril (ZESTRIL) 20 MG tablet Take 1 tablet (20 mg total) by mouth every evening.   metoprolol tartrate (LOPRESSOR) 100 MG tablet Take one tablet (100 mg) by mouth two hours prior to Cardiac CTA   metoprolol tartrate (LOPRESSOR) 25 MG tablet TAKE ONE TABLET BY MOUTH TWICE DAILY   mometasone-formoterol (DULERA) 100-5 MCG/ACT AERO Inhale 2 puffs into the lungs daily.   naloxone (NARCAN) 4 MG/0.1ML LIQD nasal spray kit Place 1 spray into the nose as needed (opioid overdose).   Naloxone HCl (NARCAN IJ) Inject 1 Dose as directed as needed (opioid overdose).   nicotine (NICODERM CQ - DOSED IN MG/24 HR) 7 mg/24hr patch Place 1 patch (7 mg total) onto the skin daily.   omeprazole (PRILOSEC) 40 MG capsule TAKE 1 CAPSULE BY MOUTH TWICE A DAY   oxyCODONE (ROXICODONE) 15 MG immediate release tablet Take 15 mg by mouth.   sucralfate (CARAFATE) 1 GM/10ML suspension TAKE 10 MLS (1 G TOTAL) BY MOUTH 4 (FOUR) TIMES DAILY.   SUMAtriptan (IMITREX) 25 MG tablet Take 1 tablet by mouth as  needed.   Tiotropium Bromide Monohydrate (SPIRIVA RESPIMAT) 2.5 MCG/ACT AERS INHALE 1 PUFF BY MOUTH INTO LUNGS ONCE DAILY   [DISCONTINUED] ondansetron (ZOFRAN) 8 MG tablet Take 1 tablet (8 mg total) by mouth every 8 (eight) hours as needed for nausea or vomiting.   [DISCONTINUED] Erenumab-aooe (AIMOVIG) 140 MG/ML SOAJ Inject 140 mg into the skin every 3 (three) months. (Patient not taking: Reported on 09/12/2022)   No facility-administered medications prior to visit.    Review of Systems   Constitutional:  Negative for chills, fatigue and fever.  Respiratory:  Negative for cough, chest tightness, shortness of breath and wheezing.   Gastrointestinal:  Positive for nausea.  Skin:  Positive for rash. Negative for color change.  Neurological:  Positive for headaches. Negative for light-headedness.       Objective    BP 116/75 (BP Location: Right Arm, Patient Position: Sitting, Cuff Size: Normal)   Pulse 73   Temp 98.1 F (36.7 C) (Oral)   Resp 12   Ht 5\' 4"  (1.626 m)   Wt 191 lb 4.8 oz (86.8 kg)   LMP 12/16/2017 (Approximate)   SpO2 99%   BMI 32.84 kg/m    Physical Exam Vitals and nursing note reviewed.  Constitutional:      General: She is not in acute distress.    Appearance: Normal appearance. She is obese. She is not ill-appearing, toxic-appearing or diaphoretic.  HENT:     Head: Normocephalic and atraumatic.  Cardiovascular:     Rate and Rhythm: Normal rate and regular rhythm.     Pulses: Normal pulses.     Heart sounds: Normal heart sounds. No murmur heard.    No friction rub. No gallop.  Pulmonary:     Effort: Pulmonary effort is normal. No respiratory distress.     Breath sounds: No stridor. Rhonchi present. No rales.  Chest:     Chest wall: No tenderness.  Musculoskeletal:        General: No swelling, tenderness, deformity or signs of injury. Normal range of motion.     Right lower leg: No edema.     Left lower leg: No edema.  Skin:    General: Skin is warm and dry.     Capillary Refill: Capillary refill takes less than 2 seconds.     Coloration: Skin is not jaundiced or pale.     Findings: Erythema and rash present. No bruising or lesion.       Neurological:     General: No focal deficit present.     Mental Status: She is alert and oriented to person, place, and time. Mental status is at baseline.     Cranial Nerves: No cranial nerve deficit.     Sensory: No sensory deficit.     Motor: No weakness.     Coordination: Coordination normal.   Psychiatric:        Mood and Affect: Mood normal.        Behavior: Behavior normal.        Thought Content: Thought content normal.        Judgment: Judgment normal.     No results found for any visits on 09/12/22.  Assessment & Plan     Problem List Items Addressed This Visit       Musculoskeletal and Integument   Atopic dermatitis of both hands - Primary    Acute, known trigger- fragranced soap Improved since stopped using agent of irritation Request for topical steroid to assist RTC as needed  Other   Chronic nausea    Acute, on chronic Request for additional medication- previously on Zofran      Return if symptoms worsen or fail to improve.     Vonna Kotyk, FNP, have reviewed all documentation for this visit. The documentation on 09/12/22 for the exam, diagnosis, procedures, and orders are all accurate and complete.  Gwyneth Sprout, [place] [phone] (phone) [phone] (fax)  [place]

## 2022-09-12 ENCOUNTER — Encounter: Payer: Self-pay | Admitting: Obstetrics and Gynecology

## 2022-09-12 ENCOUNTER — Ambulatory Visit (INDEPENDENT_AMBULATORY_CARE_PROVIDER_SITE_OTHER): Payer: 59 | Admitting: Family Medicine

## 2022-09-12 ENCOUNTER — Other Ambulatory Visit: Payer: Medicaid Other | Admitting: Obstetrics and Gynecology

## 2022-09-12 ENCOUNTER — Encounter: Payer: Self-pay | Admitting: Family Medicine

## 2022-09-12 VITALS — BP 116/75 | HR 73 | Temp 98.1°F | Resp 12 | Ht 64.0 in | Wt 191.3 lb

## 2022-09-12 DIAGNOSIS — L209 Atopic dermatitis, unspecified: Secondary | ICD-10-CM | POA: Insufficient documentation

## 2022-09-12 DIAGNOSIS — R110 Nausea: Secondary | ICD-10-CM | POA: Diagnosis not present

## 2022-09-12 MED ORDER — MOMETASONE FUROATE 0.1 % EX CREA: TOPICAL_CREAM | CUTANEOUS | 1 refills | Status: DC

## 2022-09-12 MED ORDER — ONDANSETRON HCL 8 MG PO TABS: 8.0000 mg | ORAL_TABLET | Freq: Three times a day (TID) | ORAL | 1 refills | Status: DC | PRN

## 2022-09-12 NOTE — Telephone Encounter (Signed)
Patient does not currently use wheelchair, she did not express any concern for immobility during appointment. I will call patient tomorrow to clarify actual request.

## 2022-09-12 NOTE — Patient Instructions (Signed)
Visit Information  Ms. Huckins was given information about Medicaid Managed Care team care coordination services as a part of their [place] Medicaid benefit. [name] verbally consented to engagement with the [facility] Managed Care team.   If you are experiencing a medical emergency, please call 911 or report to your local emergency department or urgent care.   If you have a non-emergency medical problem during routine business hours, please contact your provider's office and ask to speak with a nurse.   For questions related to your [facility], please call: [phone] or visit the homepage here: [email or web address]  If you would like to schedule transportation through your [facility], please call the following number at least 2 days in advance of your appointment: [phone]   Rides for urgent appointments can also be made after hours by calling Member Services.  Call the [facility] at [phone], at any time, 24 hours a day, 7 days a week. If you are in danger or need immediate medical attention call 911.  If you would like help to quit smoking, call 1-800-QUIT-NOW 731 243 3285) OR Espaol: 1-855-Djelo-Ya QO:409462) o para ms informacin haga clic aqu or Text READY to 200-400 to register via text  Sara Munoz - following are the goals we discussed in your visit today:   Goals Addressed             This Visit's Progress    Protect My Health       Timeframe:  Long-Range Goal Priority:  High Start Date:       12/20/20                      Expected End Date:   ongoing             - schedule appointment for flu shot - schedule appointment for vaccines needed due to my age or health - schedule recommended health tests (blood work, mammogram, colonoscopy, pap test) - schedule and keep appointment for annual check-up    Why is this important?   Screening tests can find diseases early when they are easier to treat.  Your doctor or nurse will talk with you about which tests are important for you.  Getting shots for common diseases like the flu and shingles will help prevent them.   09/12/22:  patient seen by PCP today   Patient verbalizes understanding of instructions and care plan provided today and agrees to view in [place]. Active MyChart status and patient understanding of how to access instructions and care plan via MyChart confirmed with patient.     The Managed Medicaid care management team will reach out to the patient again over the next 30 business  days.  The  Patient has been provided with contact information for the Managed Medicaid care management team and has been advised to call with any health related questions or concerns.   [name] RN, BSN [place] Management Coordinator - Managed Medicaid High Risk [phone]   Following is a copy of your plan of care:  Care Plan : [facility] of Care  Updates made by [name], RN since 09/12/2022 12:00 AM     Problem: Health Promotion or Disease Self-Management (General Plan of Care)      Long-Range Goal: Chronic Disease Management   Start Date: 09/12/2022  Expected End Date: 12/13/2022  Priority: High  Note:   Current Barriers:  Knowledge Deficits related to plan of care for management of GERD, CAD, HTN, PVD, TIA, migranes, COPD, OSA, tobacco use, osteoarthritis, chronic pain, anxiety  Chronic Disease Management support and education needs related to  GERD, CAD, HTN, PVD, TIA, migranes, COPD, OSA, tobacco use, osteoarthritis, chronic pain, anxiety   RNCM Clinical Goal(s):  Patient will verbalize understanding of plan for management of GERD, CAD, HTN, PVD, TIA, migranes, COPD, OSA, tobacco use, osteoarthritis, chronic pain, anxiety   as evidenced by patient report verbalize basic  understanding of  GERD, CAD, HTN, PVD, TIA, migranes, COPD, OSA, tobacco use, osteoarthritis, chronic pain, anxiety  disease process and self health management plan as evidenced by patient report take all medications exactly as prescribed and will call provider for medication related questions as evidenced by patient report  demonstrate understanding of rationale for each prescribed medication as evidenced by patient report attend all scheduled medical appointments as evidenced by patient report and EMR review demonstrate Ongoing adherence to prescribed treatment plan for  GERD, CAD, HTN, PVD, TIA, migranes, COPD, OSA, tobacco use, osteoarthritis, chronic pain, anxiety as evidenced by patient report continue to work with RN Care Manager to address care management and care coordination needs related to GERD, CAD, HTN, PVD, TIA, migranes, COPD, OSA, tobacco use, osteoarthritis, chronic pain, anxiety  as evidenced by adherence to CM Team Scheduled appointments through collaboration with RN Care manager, provider, and care team.   Interventions: Inter-disciplinary care team collaboration (see longitudinal plan of care) Evaluation of current treatment plan related to  self management and patient's adherence to plan as established by provider  CAD Interventions: (Status:  New goal.) Long Term Goal Assessed understanding of CAD diagnosis Medications reviewed including medications utilized in CAD treatment plan Counseled on importance of regular laboratory monitoring as prescribed Reviewed Importance of taking all medications as prescribed Reviewed Importance of attending all scheduled provider appointments Advised to report any changes in symptoms or exercise tolerance Screening for signs and symptoms of depression related to chronic disease state Assessed social determinant of health barriers  COPD Interventions:  (Status:  New goal.) Long Term Goal Advised patient to self assesses COPD action plan  zone and make appointment with provider if in the yellow zone for 48 hours without improvement Advised patient to engage in light exercise as tolerated 3-5 days a week to aid in the the management of COPD Provided education about and advised patient to utilize infection prevention strategies to reduce risk of respiratory infection Discussed the importance of adequate rest and management of fatigue with COPD Assessed social determinant of health barriers  Hypertension Interventions:  (Status:  New goal.) Long Term Goal Last practice recorded BP readings:  BP Readings from Last 3 Encounters:  09/12/22 116/75  08/13/22 124/77  07/19/22 100/62  Most recent eGFR/CrCl:  Lab Results  Component Value Date   EGFR 83 04/08/2022    No components found for: "CRCL"  Evaluation of current treatment plan related to hypertension self management and patient's adherence to plan as established by provider Reviewed medications with patient and discussed importance of compliance Discussed plans with patient for ongoing care management follow up and provided patient with direct contact information for care management team Advised patient, providing education and rationale, to monitor blood pressure daily and record, calling PCP for findings outside established parameters Reviewed scheduled/upcoming provider appointments including:  Discussed complications of poorly controlled blood pressure such as heart disease, stroke, circulatory complications, vision complications, kidney impairment, sexual  dysfunction Assessed social determinant of health barriers  Pain Interventions:  (Status:  New goal.) Long Term Goal Pain assessment performed Medications reviewed Reviewed provider established plan for pain management Discussed importance of adherence to all scheduled medical appointments Counseled on the importance of reporting any/all new or changed pain symptoms or management strategies to pain management  provider Advised patient to report to care team affect of pain on daily activities Reviewed with patient prescribed pharmacological and nonpharmacological pain relief strategies Assessed social determinant of health barriers  Patient Goals/Self-Care Activities: Take all medications as prescribed Attend all scheduled provider appointments Call pharmacy for medication refills 3-7 days in advance of running out of medications Perform all self care activities independently  Perform IADL's (shopping, preparing meals, housekeeping, managing finances) independently Call provider office for new concerns or questions   Follow Up Plan:  The patient has been provided with contact information for the care management team and has been advised to call with any health related questions or concerns.  The care management team will reach out to the patient again over the next 30 business  days.

## 2022-09-12 NOTE — Assessment & Plan Note (Signed)
Acute, known trigger- fragranced soap Improved since stopped using agent of irritation Request for topical steroid to assist RTC as needed

## 2022-09-12 NOTE — Patient Outreach (Signed)
Medicaid Managed Care   Nurse Care Manager Note  09/12/2022 Name:  Sara Munoz MRN:  [id number] DOB:  1966-05-26  Sara Munoz is an 57 y.o. year old female who is a primary patient of [name], Riki Sheer, MD.  The Medicaid Managed Care Coordination team was consulted for assistance with:    Chronic healthcare management needs, GERD, CAD, HTN, PVD, TIA, migraines, COPD, OSA, tobacco use, osteoarthritis, chronic pain, anxiety  [name] was given information about Medicaid Managed Care Coordination team services today. Sara Munoz Patient agreed to services and verbal consent obtained.  Engaged with patient by telephone for initial visit in response to provider referral for case management and/or care coordination services.   Assessments/Interventions:  Review of past medical history, allergies, medications, health status, including review of consultants reports, laboratory and other test data, was performed as part of comprehensive evaluation and provision of chronic care management services.  SDOH (Social Determinants of Health) assessments and interventions performed: SDOH Interventions    Flowsheet Row Patient Outreach Telephone from 09/12/2022 in [place] Patient Outreach Telephone from 05/14/2022 in [place] Patient Outreach Telephone from 04/15/2022 in [place] Patient Outreach Telephone from 03/15/2022 in [place] Patient Outreach Telephone from 01/30/2022 in [place] Patient Outreach Telephone from 12/24/2021 in [place] Coordination  SDOH Interventions        Food Insecurity Interventions -- -- -- -- Intervention Not Indicated --  Housing Interventions -- Intervention Not Indicated -- -- -- --  Transportation Interventions -- -- Intervention Not Indicated  -- -- --  Utilities Interventions -- -- -- Intervention Not Indicated -- --  Alcohol Usage Interventions -- -- -- Intervention Not Indicated (Score <7) -- --  Financial Strain Interventions -- -- Intervention Not Indicated -- -- --  Physical Activity Interventions Intervention Not Indicated -- -- -- -- Other (Comments)  [unable to participate in this type of exercise]  Stress Interventions -- Other (Comment)  [sees Psychiatrist and counselor] -- -- -- --     Care Plan  Allergies  Allergen Reactions   Cyclobenzaprine Hives, Swelling and Rash    Facial/lip swelling      Levofloxacin Hives, Swelling and Rash   Chlorpheniramine Maleate     Other reaction(s): Unknown   Phenergan [Promethazine Hcl] Other (See Comments)    Agitation.    Phenylephrine Hcl Other (See Comments)   Toradol [Ketorolac Tromethamine] Swelling and Other (See Comments)    Facial/tongue swelling    Zoloft [Sertraline Hcl] Swelling    Tongue swelling      Cephalexin Hives and Rash   Ketorolac Rash   Tramadol Hives, Swelling, Other (See Comments) and Rash    Lip swelling     Medications Reviewed Today     Reviewed by [name], RN (Registered Nurse) on 09/12/22 at [place] List Status: <None>   Medication Order Taking? Sig Documenting Provider Last Dose Status Informant  albuterol (PROVENTIL) (2.5 MG/3ML) 0.083% nebulizer solution [id number] No Take 3 mLs (2.5 mg total) by nebulization every 6 (six) hours as needed for wheezing or shortness of breath. [name], [name], MD Taking Active   albuterol (VENTOLIN HFA) 108 (90 Base) MCG/ACT inhaler [id number] No INHALE TWO PUFFS BY MOUTH INTO LUNGS EVERY 6 HOURS AS NEEDED FOR SHORTNESS OF BREATH OR wheezing [name], [name], MD Taking Active   amLODipine (NORVASC) 5 MG tablet [id number]  No Take 1 tablet (5 mg total) by mouth daily. [name], Riki Sheer, MD Taking Active   aspirin EC 81 MG tablet [id number] No Take 81 mg by mouth daily. Swallow  whole. [provider] Taking Active   atorvastatin (LIPITOR) 40 MG tablet [id number] No Take 1 tablet (40 mg total) by mouth every evening. Kate Sable, MD Taking Active   docusate sodium (COLACE) 100 MG capsule [id number] No Take 100 mg by mouth 2 (two) times daily. [provider] Taking Active Self  gabapentin (NEURONTIN) 600 MG tablet [id number] No Take 600 mg by mouth 4 (four) times daily. [provider] Taking Active Self  lactulose (CHRONULAC) 10 GM/15ML solution [id number] No Take 30 mLs by mouth in the morning, at noon, in the evening, and at bedtime. [provider] Taking Active   LINZESS 290 MCG CAPS capsule [id number] No Take 290 mcg by mouth every morning. [provider] Taking Active   lisinopril (ZESTRIL) 20 MG tablet [id number] No Take 1 tablet (20 mg total) by mouth every evening. Kate Sable, MD Taking Active   metoprolol tartrate (LOPRESSOR) 100 MG tablet [id number] No Take one tablet (100 mg) by mouth two hours prior to Cardiac CTA Kate Sable, MD Taking Active   metoprolol tartrate (LOPRESSOR) 25 MG tablet [id number] No TAKE ONE TABLET BY MOUTH TWICE DAILY [name], [name], MD Taking Active   mometasone (ELOCON) 0.1 % cream [id number]  Apply to area of irritation BID/day for 7 days max. Tally Joe T, FNP  Active   mometasone-formoterol [facility]) 100-5 MCG/ACT Hollie Salk [id number] No Inhale 2 puffs into the lungs daily. [name], [name], MD Taking Active   naloxone [facility]) 4 MG/0.1ML LIQD nasal spray kit [id number] No Place 1 spray into the nose as needed (opioid overdose). [provider] Taking Active Self           Med Note Ivor Reining May 03, 2021 11:23 AM)    Naloxone HCl [facility] IJ) [id number] No Inject 1 Dose as directed as needed (opioid overdose). [provider] Taking Active Self           Med Note Ivor Reining May 03, 2021 11:32 AM)    nicotine (NICODERM  CQ - DOSED IN MG/24 HR) 7 mg/24hr patch [id number] No Place 1 patch (7 mg total) onto the skin daily. [name], Riki Sheer, MD Taking Active   omeprazole (PRILOSEC) 40 MG capsule [id number] No TAKE 1 CAPSULE BY MOUTH TWICE A DAY [name], MD Taking Active   ondansetron (ZOFRAN) 8 MG tablet [id number]  Take 1 tablet (8 mg total) by mouth every 8 (eight) hours as needed for nausea or vomiting. Gwyneth Sprout, FNP  Active   oxyCODONE (ROXICODONE) 15 MG immediate release tablet [id number] No Take 15 mg by mouth. [provider] Taking Active   sucralfate (CARAFATE) 1 GM/10ML suspension [id number] No TAKE 10 MLS (1 G TOTAL) BY MOUTH 4 (FOUR) TIMES DAILY. [name], MD Taking Active   SUMAtriptan (IMITREX) 25 MG tablet [id number] No Take 1 tablet by mouth as needed. [provider] Taking Active   Tiotropium Bromide Monohydrate (SPIRIVA RESPIMAT) 2.5 MCG/ACT AERS [id number] No INHALE 1 PUFF BY MOUTH INTO LUNGS ONCE DAILY [name], [name], MD Taking Active            Patient Active Problem List   Diagnosis Date Noted   Atopic dermatitis of both hands 09/12/2022   Headache disorder 08/13/2022   Encounter for annual physical examination excluding  gynecological examination in a patient older than 17 years 04/03/2022   Essential hypertension 03/11/2022   Lumbar spondylosis 02/12/2022   Chronic migraine w/o aura w/o status migrainosus, not intractable 07/09/2021   Obstructive sleep apnea 07/09/2021   Body mass index (BMI) 32.0-32.9, adult 06/07/2021   Pseudoarthrosis of cervical spine ([place]) 05/14/2021   Lumbar radiculopathy 05/23/2020   Sacroiliac inflammation ([place]) 11/25/2019   Sepsis ([place]) 10/19/2018   Severe sepsis ([place]) 10/19/2018   Cervical radiculopathy 02/12/2018   Chronic migraine 02/12/2018   Paresthesia 08/29/2017   Neck pain 08/29/2017   Daily headache 08/29/2017   Chronic active hepatitis ([place]) 07/29/2017   Neurogenic pain 07/31/2016   Chronic pain  syndrome 06/05/2016   Carotid artery stenosis 02/27/2016   Chronic constipation 02/27/2016   Chronic nausea 02/27/2016   Hypercholesterolemia 02/27/2016   Osteoarthritis 02/27/2016   PTSD (post-traumatic stress disorder) 02/27/2016   TIA (transient ischemic attack) 02/27/2016   Long term current use of opiate analgesic 02/27/2016   Long term prescription opiate use 02/27/2016   Encounter for pain management consult 02/27/2016   Chronic hip pain (Location of Tertiary source of pain) (Bilateral) (L>R) 02/27/2016   Chronic knee pain (Bilateral) (L>R) 02/27/2016   Chronic shoulder pain (Bilateral) (L>R) 02/27/2016   Chronic sacroiliac joint pain (Bilateral) (L>R) 02/27/2016   Chronic low back pain (Location of Primary Source of Pain) (Bilateral) (L>R) 02/27/2016   Chronic lower extremity pain (Location of Secondary source of pain) (Bilateral) (L>R) 02/27/2016   Osteoarthritis of hip (Bilateral) (L>R) 02/27/2016   Chronic neck pain (posterior midline) (Bilateral) (L>R) 02/27/2016   Cervicogenic headache (Bilateral) (L>R) 02/27/2016   Occipital headache (Bilateral) (L>R) 02/27/2016   Chronic upper extremity pain (Bilateral) (L>R) 02/27/2016   Chronic Cervical radicular pain (Bilateral) (L>R) 02/27/2016   Chronic lumbar radicular pain (Right) (L5 dermatome) 02/27/2016   Lumbar facet syndrome (Bilateral) (L>R) 02/27/2016   Long term prescription benzodiazepine use 02/27/2016   Hypertension    Chronic obstructive pulmonary disease ([place]) 10/09/2015   GERD (gastroesophageal reflux disease) 10/09/2015   Non-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/2014 08/02/2015   Hyperlipidemia 08/02/2015   Costochondritis 08/02/2015   Drug abuse, 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) 09/03/2014   PVD (peripheral vascular disease) ([place]) 09/03/2014   Smoker 09/03/2014   Cigarette nicotine dependence with nicotine-induced disorder 07/08/2014   Anemia of chronic  disease 03/19/2014   Narcotic abuse, continuous ([place]) 03/19/2014   Polysubstance abuse ([place]) 03/19/2014   MSSA (methicillin susceptible Staphylococcus aureus) septicemia ([place]) 03/07/2014   Hypomagnesemia 01/04/2014   Chronic cough 09/04/2011   Sleep apnea, obstructive 09/04/2011   Sinusitis, acute 09/04/2011   Conditions to be addressed/monitored per PCP order:  Chronic healthcare management needs, GERD, CAD, HTN, PVD, TIA, migraines, COPD, OSA, tobacco use, osteoarthritis, chronic pain, anxiety  Care Plan : RN Care Manager Plan of Care  Updates made by [name], RN since 09/12/2022 12:00 AM     Problem: Health Promotion or Disease Self-Management (General Plan of Care)      Long-Range Goal: Chronic Disease Management   Start Date: 09/12/2022  Expected End Date: 12/13/2022  Priority: High  Note:   Current Barriers:  Knowledge Deficits related to plan of care for management of GERD, CAD, HTN, PVD, TIA, migranes, COPD, OSA, tobacco use, osteoarthritis, chronic pain, anxiety  Chronic Disease Management support and education needs related to  GERD, CAD, HTN, PVD, TIA, migranes, COPD, OSA, tobacco use, osteoarthritis, chronic pain, anxiety  RNCM Clinical Goal(s):  Patient will verbalize understanding of plan for management of GERD, CAD, HTN, PVD, TIA, migranes, COPD, OSA, tobacco use, osteoarthritis, chronic pain, anxiety   as evidenced by patient report verbalize basic understanding of  GERD, CAD, HTN, PVD, TIA, migranes, COPD, OSA, tobacco use, osteoarthritis, chronic pain, anxiety  disease process and self health management plan as evidenced by patient report take all medications exactly as prescribed and will call provider for medication related questions as evidenced by patient report  demonstrate understanding of rationale for each prescribed medication as evidenced by patient report attend all scheduled medical appointments as evidenced by patient report and EMR  review demonstrate Ongoing adherence to prescribed treatment plan for  GERD, CAD, HTN, PVD, TIA, migranes, COPD, OSA, tobacco use, osteoarthritis, chronic pain, anxiety as evidenced by patient report continue to work with RN Care Manager to address care management and care coordination needs related to GERD, CAD, HTN, PVD, TIA, migranes, COPD, OSA, tobacco use, osteoarthritis, chronic pain, anxiety  as evidenced by adherence to CM Team Scheduled appointments through collaboration with RN Care manager, provider, and care team.   Interventions: Inter-disciplinary care team collaboration (see longitudinal plan of care) Evaluation of current treatment plan related to  self management and patient's adherence to plan as established by provider   CAD Interventions: (Status:  New goal.) Long Term Goal Assessed understanding of CAD diagnosis Medications reviewed including medications utilized in CAD treatment plan Counseled on importance of regular laboratory monitoring as prescribed Reviewed Importance of taking all medications as prescribed Reviewed Importance of attending all scheduled provider appointments Advised to report any changes in symptoms or exercise tolerance Screening for signs and symptoms of depression related to chronic disease state Assessed social determinant of health barriers  COPD Interventions:  (Status:  New goal.) Long Term Goal Advised patient to self assesses COPD action plan zone and make appointment with provider if in the yellow zone for 48 hours without improvement Advised patient to engage in light exercise as tolerated 3-5 days a week to aid in the the management of COPD Provided education about and advised patient to utilize infection prevention strategies to reduce risk of respiratory infection Discussed the importance of adequate rest and management of fatigue with COPD Assessed social determinant of health barriers  Hypertension Interventions:  (Status:  New  goal.) Long Term Goal Last practice recorded BP readings:  BP Readings from Last 3 Encounters:  09/12/22 116/75  08/13/22 124/77  07/19/22 100/62  Most recent eGFR/CrCl:  Lab Results  Component Value Date   EGFR 83 04/08/2022    No components found for: "CRCL"  Evaluation of current treatment plan related to hypertension self management and patient's adherence to plan as established by provider Reviewed medications with patient and discussed importance of compliance Discussed plans with patient for ongoing care management follow up and provided patient with direct contact information for care management team Advised patient, providing education and rationale, to monitor blood pressure daily and record, calling PCP for findings outside established parameters Reviewed scheduled/upcoming provider appointments including:  Discussed complications of poorly controlled blood pressure such as heart disease, stroke, circulatory complications, vision complications, kidney impairment, sexual dysfunction Assessed social determinant of health barriers  Pain Interventions:  (Status:  New goal.) Long Term Goal Pain assessment performed Medications reviewed Reviewed provider established plan for pain management Discussed importance of adherence to all scheduled medical appointments Counseled on the importance of reporting any/all new or changed pain symptoms or management strategies to pain  management provider Advised patient to report to care team affect of pain on daily activities Reviewed with patient prescribed pharmacological and nonpharmacological pain relief strategies Assessed social determinant of health barriers  Patient Goals/Self-Care Activities: Take all medications as prescribed Attend all scheduled provider appointments Call pharmacy for medication refills 3-7 days in advance of running out of medications Perform all self care activities independently  Perform IADL's (shopping,  preparing meals, housekeeping, managing finances) independently Call provider office for new concerns or questions   Follow Up Plan:  The patient has been provided with contact information for the care management team and has been advised to call with any health related questions or concerns.  The care management team will reach out to the patient again over the next 30 business  days   Follow Up:  Patient agrees to Care Plan and Follow-up.  Plan: The Managed Medicaid care management team will reach out to the patient again over the next 30 business  days. and The  Patient has been provided with contact information for the Managed Medicaid care management team and has been advised to call with any health related questions or concerns.  Date/time of next scheduled RN care management/care coordination outreach: 10/15/22 at 315

## 2022-09-12 NOTE — Assessment & Plan Note (Signed)
Acute, on chronic Request for additional medication- previously on Zofran

## 2022-09-12 NOTE — Patient Instructions (Signed)
Encouraged to speak with cardiology about possible weight loss medications- declines start of wellbutrin at this time Body mass index is 32.84 kg/m.

## 2022-09-13 ENCOUNTER — Ambulatory Visit: Payer: 59

## 2022-09-14 ENCOUNTER — Encounter (HOSPITAL_COMMUNITY): Payer: Self-pay | Admitting: Urgent Care

## 2022-09-16 ENCOUNTER — Ambulatory Visit
Admission: RE | Admit: 2022-09-16 | Discharge: 2022-09-16 | Disposition: A | Discharge status: Home / Self Care | Payer: 59 | Source: Ambulatory Visit | Attending: Anesthesiology | Admitting: Anesthesiology

## 2022-09-16 ENCOUNTER — Telehealth: Payer: Self-pay | Admitting: *Deleted

## 2022-09-16 ENCOUNTER — Other Ambulatory Visit: Payer: Self-pay | Admitting: Anesthesiology

## 2022-09-16 DIAGNOSIS — M5136 Other intervertebral disc degeneration, lumbar region: Secondary | ICD-10-CM | POA: Insufficient documentation

## 2022-09-16 DIAGNOSIS — M961 Postlaminectomy syndrome, not elsewhere classified: Secondary | ICD-10-CM | POA: Insufficient documentation

## 2022-09-16 MED ORDER — GADOBUTROL 1 MMOL/ML IV SOLN: 10.0000 mL | Freq: Once | INTRAVENOUS | Status: DC | PRN

## 2022-09-16 NOTE — Telephone Encounter (Signed)
Patient seen in cardiology clinic 07/19/2022 by Dr. Garen Lah.  Due to complaint of chest pain with multiple CV risk factors, she was advised to undergo echocardiogram and coronary CTA.  These test are pending, therefore would recommend that she postpone surgery until completion of cardiac testing.  Please contact us if you have questions.   Sara Life, NP-C  09/16/2022, 8:37 AM [address] Office [phone] Fax [phone]

## 2022-09-16 NOTE — Telephone Encounter (Signed)
-----   Message from Karen Kitchens, NP sent at 09/14/2022  3:08 PM EDT ----- Regarding: Request for pre-operative cardiac clearance Request for pre-operative cardiac clearance:  1. What type of surgery is being performed?  SHOULDER ARTHROSCOPY WITH DEBRIDEMENT, DECOMPRESSION, AND POSSIBLE REPAIR OF RECURRENT ROTATOR CUFF TEAR.  2. When is this surgery scheduled?  09/26/2022  3. Type of clearance being requested (medical, pharmacy, both)? MEDICAL   4. Are there any medications that need to be held prior to surgery? ASA  5. Practice name and name of physician performing surgery?  Performing surgeon: Dr. Milagros Evener, MD Requesting clearance: Honor Loh, FNP-C    6. Anesthesia type (none, local, MAC, general)? GENERAL  7. What is the office phone and fax number?   Phone: [phone] Fax: [phone]  ATTENTION: Unable to create telephone message as per your standard workflow. Directed by [facility] providers to send requests for cardiac clearance to this pool for appropriate distribution to provider covering pre-operative clearances.   Honor Loh, MSN, APRN, FNP-C, CEN [facility]  Peri-operative Services Nurse Practitioner Phone: [phone] 09/14/22 3:08 PM

## 2022-09-17 ENCOUNTER — Other Ambulatory Visit: Payer: Self-pay | Admitting: Family Medicine

## 2022-09-17 DIAGNOSIS — N6320 Unspecified lump in the left breast, unspecified quadrant: Secondary | ICD-10-CM

## 2022-09-18 ENCOUNTER — Inpatient Hospital Stay: Admission: RE | Admit: 2022-09-18 | Payer: 59 | Source: Ambulatory Visit

## 2022-09-23 ENCOUNTER — Ambulatory Visit: Payer: Medicaid Other | Admitting: Cardiology

## 2022-09-26 ENCOUNTER — Ambulatory Visit: Admit: 2022-09-26 | Payer: 59 | Admitting: Surgery

## 2022-09-26 SURGERY — SHOULDER ARTHROSCOPY WITH SUBACROMIAL DECOMPRESSION, ROTATOR CUFF REPAIR AND BICEP TENDON REPAIR
Anesthesia: Choice | Site: Shoulder | Laterality: Left

## 2022-10-02 ENCOUNTER — Telehealth (HOSPITAL_COMMUNITY): Payer: Self-pay | Admitting: *Deleted

## 2022-10-02 NOTE — Telephone Encounter (Signed)
Attempted to call patient regarding upcoming cardiac CT appointment. °Left message on voicemail with name and callback number ° °Jaxsin Bottomley RN Navigator Cardiac Imaging ° [facility] °[phone] Office °[phone] Cell ° °

## 2022-10-03 ENCOUNTER — Ambulatory Visit
Admission: RE | Admit: 2022-10-03 | Discharge: 2022-10-03 | Disposition: A | Discharge status: Home / Self Care | Payer: 59 | Source: Ambulatory Visit | Attending: Cardiology | Admitting: Cardiology

## 2022-10-03 DIAGNOSIS — R079 Chest pain, unspecified: Secondary | ICD-10-CM

## 2022-10-03 MED ORDER — IOHEXOL 350 MG/ML SOLN: 100.0000 mL | Freq: Once | INTRAVENOUS | Status: DC | PRN

## 2022-10-03 MED ORDER — NITROGLYCERIN 0.4 MG SL SUBL: 0.4000 mg | SUBLINGUAL_TABLET | Freq: Once | SUBLINGUAL | Status: DC

## 2022-10-03 NOTE — Progress Notes (Signed)
Patient HR increase to 75 when placed in CT scanner. BP remains on low side. Unable to give any medication. Dr. Myriam Forehand aware. Rescheduled

## 2022-10-09 ENCOUNTER — Ambulatory Visit: Payer: Medicaid Other | Admitting: Adult Health

## 2022-10-10 ENCOUNTER — Ambulatory Visit
Admission: RE | Admit: 2022-10-10 | Discharge: 2022-10-10 | Disposition: A | Discharge status: Home / Self Care | Payer: 59 | Source: Ambulatory Visit | Attending: Family Medicine | Admitting: Family Medicine

## 2022-10-10 DIAGNOSIS — N6320 Unspecified lump in the left breast, unspecified quadrant: Secondary | ICD-10-CM

## 2022-10-10 DIAGNOSIS — N6323 Unspecified lump in the left breast, lower outer quadrant: Secondary | ICD-10-CM | POA: Diagnosis not present

## 2022-10-15 ENCOUNTER — Other Ambulatory Visit: Payer: 59 | Admitting: Obstetrics and Gynecology

## 2022-10-15 NOTE — Patient Instructions (Signed)
Hi Ms. Ziolkowski, sorry to miss you today - as a part of your Medicaid benefit, you are eligible for care management and care coordination services at no cost or copay. I was unable to reach you by phone today but would be happy to help you with your health related needs. Please feel free to call me at [phone].   A member of the Managed Medicaid care management team will reach out to you again over the next 30 business days.   Kathi Der RN, BSN [place]  [employer] - Managed Medicaid High Risk [phone]

## 2022-10-15 NOTE — Patient Outreach (Signed)
  Medicaid Managed Care   Unsuccessful Attempt Note   10/15/2022 Name: Sara Munoz MRN: [id number] DOB: 1966-06-02  Referred by: Ronnald Ramp, MD Reason for referral : High Risk Managed Medicaid (Unsuccessful telephone outreach)  An unsuccessful telephone outreach was attempted today. The patient was referred to the case management team for assistance with care management and care coordination.    Follow Up Plan: The Managed Medicaid care management team will reach out to the patient again over the next 30 business  days. and The  Patient has been provided with contact information for the Managed Medicaid care management team and has been advised to call with any health related questions or concerns.   Kathi Der RN, BSN [place]  [employer] - Managed Medicaid High Risk [phone]

## 2022-10-17 ENCOUNTER — Other Ambulatory Visit: Payer: Self-pay | Admitting: Neurological Surgery

## 2022-10-21 ENCOUNTER — Ambulatory Visit: Payer: 59 | Attending: Cardiology

## 2022-10-21 ENCOUNTER — Ambulatory Visit (INDEPENDENT_AMBULATORY_CARE_PROVIDER_SITE_OTHER): Payer: 59

## 2022-10-21 DIAGNOSIS — R079 Chest pain, unspecified: Secondary | ICD-10-CM

## 2022-10-21 DIAGNOSIS — M79605 Pain in left leg: Secondary | ICD-10-CM

## 2022-10-21 DIAGNOSIS — M79604 Pain in right leg: Secondary | ICD-10-CM

## 2022-10-21 LAB — ECHOCARDIOGRAM COMPLETE
AR max vel: 2.11 cm2
AV Area VTI: 2.36 cm2
AV Area mean vel: 2.09 cm2
AV Mean grad: 4 mmHg
AV Peak grad: 7.6 mmHg
Ao pk vel: 1.38 m/s
Area-P 1/2: 3.42 cm2
Calc EF: 75 %
S' Lateral: 1.6 cm
Single Plane A2C EF: 77.4 %
Single Plane A4C EF: 73.1 %

## 2022-10-21 LAB — VAS US ABI WITH/WO TBI
Left ABI: 1.01
Right ABI: 1.07

## 2022-10-21 MED ORDER — PERFLUTREN LIPID MICROSPHERE
1.0000 mL | INTRAVENOUS | Status: AC | PRN
Start: 2022-10-21 — End: 2022-10-21
  Administered 2022-10-21: 2 mL via INTRAVENOUS

## 2022-10-22 ENCOUNTER — Encounter: Payer: Self-pay | Admitting: Advanced Practice Midwife

## 2022-10-22 ENCOUNTER — Other Ambulatory Visit (HOSPITAL_COMMUNITY)
Admission: RE | Admit: 2022-10-22 | Discharge: 2022-10-22 | Disposition: A | Discharge status: Home / Self Care | Payer: 59 | Source: Ambulatory Visit | Attending: Advanced Practice Midwife | Admitting: Advanced Practice Midwife

## 2022-10-22 ENCOUNTER — Ambulatory Visit (INDEPENDENT_AMBULATORY_CARE_PROVIDER_SITE_OTHER): Payer: 59 | Admitting: Advanced Practice Midwife

## 2022-10-22 ENCOUNTER — Other Ambulatory Visit: Payer: Self-pay | Admitting: Advanced Practice Midwife

## 2022-10-22 ENCOUNTER — Other Ambulatory Visit (INDEPENDENT_AMBULATORY_CARE_PROVIDER_SITE_OTHER): Payer: 59

## 2022-10-22 VITALS — BP 122/74 | HR 74 | Ht 64.0 in | Wt 189.6 lb

## 2022-10-22 DIAGNOSIS — R102 Pelvic and perineal pain: Secondary | ICD-10-CM | POA: Diagnosis not present

## 2022-10-22 DIAGNOSIS — Z124 Encounter for screening for malignant neoplasm of cervix: Secondary | ICD-10-CM

## 2022-10-22 DIAGNOSIS — N83209 Unspecified ovarian cyst, unspecified side: Secondary | ICD-10-CM

## 2022-10-22 DIAGNOSIS — Z1151 Encounter for screening for human papillomavirus (HPV): Secondary | ICD-10-CM | POA: Insufficient documentation

## 2022-10-22 DIAGNOSIS — Z01419 Encounter for gynecological examination (general) (routine) without abnormal findings: Secondary | ICD-10-CM | POA: Diagnosis present

## 2022-10-23 ENCOUNTER — Encounter: Payer: Self-pay | Admitting: Obstetrics and Gynecology

## 2022-10-23 ENCOUNTER — Other Ambulatory Visit: Payer: 59 | Admitting: Obstetrics and Gynecology

## 2022-10-23 ENCOUNTER — Ambulatory Visit: Payer: Self-pay | Admitting: Cardiology

## 2022-10-23 ENCOUNTER — Encounter: Payer: Self-pay | Admitting: Advanced Practice Midwife

## 2022-10-23 NOTE — Patient Outreach (Signed)
  Medicaid Managed Care   Unsuccessful Attempt Note   10/23/2022 Name: Sara Munoz MRN: [id number] DOB: 01-Sep-1965  Referred by: Ronnald Ramp, MD Reason for referral : High Risk Managed Medicaid (Unsuccessful telephone outreach)  A second unsuccessful telephone outreach was attempted today. The patient was referred to the case management team for assistance with care management and care coordination.    Follow Up Plan: The Managed Medicaid care management team will reach out to the patient again over the next 30 business  days. and The  Patient has been provided with contact information for the Managed Medicaid care management team and has been advised to call with any health related questions or concerns.   Kathi Der RN, BSN [place]  [employer] - Managed Medicaid High Risk [phone]

## 2022-10-23 NOTE — Progress Notes (Signed)
Patient ID: Sara Munoz, female   DOB: 07/25/1965, 57 y.o.   MRN: [id number]  Reason for Visit: Gynecologic Exam (Patient comes in office today for pap examination and to address concern of left ovarian pain that has been present for 6 months or more, patient states CT done of abdomen/pelvis 11/2021 showed cyst on ovary. )   Referred by [name], Makie*  Subjective:  Date of Service: 10/22/2022  HPI:  Sara Munoz is a 57 y.o. female being seen by referral for PAP smear. Her last PAP was normal 5 years ago. She mentions a concern of daily sharp left pelvic pain that is ongoing for approximately the last 6 months. The pain is worse with intercourse. She has a history of ovarian cystectomy in 2010. Last imaging was in June 2023.   Normal appearing uterus. Small BILATERAL simple appearing ovarian cysts largest 2.6 cm diameter; no follow-up imaging recommended- per CT Abdomen 12/12/21  Discussed having f/u gyn ultrasound. Able to do imaging in clinic today.  Past Medical History:  Diagnosis Date   Anemia    Anxiety    Asthma    Atypical chest pain 08/02/2015   CAD (coronary artery disease)    Chronic back pain    COPD (chronic obstructive pulmonary disease) (HCC)    Coronary artery disease    a. Mild-non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/2014   GERD (gastroesophageal reflux disease)    Hepatitis C    per patient, "took medicines for it and no longer has it"   Hypercholesteremia    Hypertension    Hypokalemia    Hypomagnesemia 01/04/2014   Liver disease    MI (myocardial infarction) (HCC)    Nerve pain    per patient, in the lower back   Opiate use 02/27/2016   Osteoarthritis    Pneumonia    Stroke [facility])    Family History  Problem Relation Age of Onset   Heart disease Mother    Lung cancer Mother    Ovarian cancer Mother    Healthy Brother    Healthy Brother    Healthy Brother    Diabetes Maternal Uncle    Breast cancer Maternal Aunt    Past Surgical  History:  Procedure Laterality Date   ANTERIOR CERVICAL DECOMP/DISCECTOMY FUSION N/A 03/30/2018   Procedure: Cervical six-seven Anterior cervical decompression/discectomy/fusion;  Surgeon: Jadene Pierini, MD;  Location: [facility] OR;  Service: Neurosurgery;  Laterality: N/A;   ANTERIOR CERVICAL DECOMP/DISCECTOMY FUSION N/A 01/11/2019   Procedure: Cervical Five-Six Anterior cervical discectomy fusion,  Cervical Five to Cervical Seven anterior instrumented fusion;  Surgeon: Jadene Pierini, MD;  Location: [facility] OR;  Service: Neurosurgery;  Laterality: N/A;  Cervical Five-Six Anterior cervical discectomy fusion,  Cervical Five to Cervical Seven anterior instrumented fusion   BACK SURGERY     CARDIAC CATHETERIZATION Left 02/22/2016   Procedure: Left Heart Cath and Coronary Angiography;  Surgeon: Alwyn Pea, MD;  Location: [facility] INVASIVE CV LAB;  Service: Cardiovascular;  Laterality: Left;   CATARACT EXTRACTION W/ INTRAOCULAR LENS IMPLANT Right    COLONOSCOPY WITH PROPOFOL N/A 09/19/2016   Procedure: COLONOSCOPY WITH PROPOFOL;  Surgeon: [name], MD;  Location: [facility] ENDOSCOPY;  Service: Endoscopy;  Laterality: N/A;   DIAGNOSTIC LAPAROSCOPY     ESOPHAGOGASTRODUODENOSCOPY (EGD) WITH PROPOFOL N/A 09/18/2017   Procedure: ESOPHAGOGASTRODUODENOSCOPY (EGD) WITH PROPOFOL;  Surgeon: [name], MD;  Location: [facility] ENDOSCOPY;  Service: Gastroenterology;  Laterality: N/A;   ESOPHAGOGASTRODUODENOSCOPY (EGD) WITH PROPOFOL N/A 08/10/2019  Procedure: ESOPHAGOGASTRODUODENOSCOPY (EGD) WITH PROPOFOL;  Surgeon: [name], MD;  Location: [facility] ENDOSCOPY;  Service: Endoscopy;  Laterality: N/A;   EYE SURGERY     R eye - cataract   FRACTURE SURGERY     hemorroids     NASAL SINUS SURGERY     ORIF FEMUR FRACTURE     ORIF TIBIA & FIBULA FRACTURES     OVARY SURGERY     POSTERIOR CERVICAL FUSION/FORAMINOTOMY N/A 05/14/2021   Procedure: Cervical five, Cervical six Laminectomy,foraminotomy with Cervical five-six  Cervical six-seven Cervical seven-Thoracic one Posterior instrumented fusion;  Surgeon: Jadene Pierini, MD;  Location: [facility] OR;  Service: Neurosurgery;  Laterality: N/A;   SHOULDER ARTHROSCOPY WITH SUBACROMIAL DECOMPRESSION, ROTATOR CUFF REPAIR AND BICEP TENDON REPAIR Left 05/28/2022   Procedure: SHOULDER ARTHROSCOPY WITH DEBRIDEMENT, DECOMPRESSION, ROTATOR CUFF REPAIR AND BICEP TENDON REPAIR;  Surgeon: Christena Flake, MD;  Location: [facility] ORS;  Service: Orthopedics;  Laterality: Left;   TRANSFORAMINAL LUMBAR INTERBODY FUSION W/ MIS 1 LEVEL Right 05/23/2020   Procedure: Right Lumbar Five Sacral One Minimally invasive transforaminal lumbar interbody fusion;  Surgeon: Jadene Pierini, MD;  Location: [facility] OR;  Service: Neurosurgery;  Laterality: Right;  Right Lumbar Five Sacral One Minimally invasive transforaminal lumbar interbody fusion    Short Social History:  Social History   Tobacco Use   Smoking status: Every Day    Packs/day: 1.00    Years: 35.00    Additional pack years: 0.00    Total pack years: 35.00    Types: Cigarettes   Smokeless tobacco: Never   Tobacco comments:    07/2019- 1.5 a day  Substance Use Topics   Alcohol use: Not Currently    Alcohol/week: 0.0 standard drinks of alcohol    Allergies  Allergen Reactions   Cyclobenzaprine Hives, Swelling and Rash    Facial/lip swelling      Levofloxacin Hives, Swelling and Rash   Chlorpheniramine Maleate     Other reaction(s): Unknown   Phenergan [Promethazine Hcl] Other (See Comments)    Agitation.    Phenylephrine Hcl Other (See Comments)   Toradol [Ketorolac Tromethamine] Swelling and Other (See Comments)    Facial/tongue swelling    Zoloft [Sertraline Hcl] Swelling    Tongue swelling      Cephalexin Hives and Rash   Ketorolac Rash   Tramadol Hives, Swelling, Other (See Comments) and Rash    Lip swelling     Current Outpatient Medications  Medication Sig Dispense Refill   albuterol (PROVENTIL) (2.5 MG/3ML)  0.083% nebulizer solution Take 3 mLs (2.5 mg total) by nebulization every 6 (six) hours as needed for wheezing or shortness of breath. 75 mL 12   albuterol (VENTOLIN HFA) 108 (90 Base) MCG/ACT inhaler INHALE TWO PUFFS BY MOUTH INTO LUNGS EVERY 6 HOURS AS NEEDED FOR SHORTNESS OF BREATH OR wheezing 18 g 0   amLODipine (NORVASC) 5 MG tablet Take 1 tablet (5 mg total) by mouth daily. 90 tablet 0   aspirin EC 81 MG tablet Take 81 mg by mouth daily. Swallow whole.     atorvastatin (LIPITOR) 40 MG tablet Take 1 tablet (40 mg total) by mouth every evening. 90 tablet 3   docusate sodium (COLACE) 100 MG capsule Take 100 mg by mouth 2 (two) times daily.     gabapentin (NEURONTIN) 600 MG tablet Take 600 mg by mouth 4 (four) times daily.     lactulose (CHRONULAC) 10 GM/15ML solution Take 30 mLs by mouth in the morning, at noon,  in the evening, and at bedtime.     LINZESS 290 MCG CAPS capsule Take 290 mcg by mouth every morning.     lisinopril (ZESTRIL) 20 MG tablet Take 1 tablet (20 mg total) by mouth every evening. 90 tablet 36   metoprolol tartrate (LOPRESSOR) 100 MG tablet Take one tablet (100 mg) by mouth two hours prior to Cardiac CTA 1 tablet 0   metoprolol tartrate (LOPRESSOR) 25 MG tablet TAKE ONE TABLET BY MOUTH TWICE DAILY 180 tablet 0   mometasone (ELOCON) 0.1 % cream Apply to area of irritation BID/day for 7 days max. 45 g 1   mometasone-formoterol (DULERA) 100-5 MCG/ACT AERO Inhale 2 puffs into the lungs daily. 13 g 0   naloxone (NARCAN) 4 MG/0.1ML LIQD nasal spray kit Place 1 spray into the nose as needed (opioid overdose).     Naloxone HCl (NARCAN IJ) Inject 1 Dose as directed as needed (opioid overdose).     nicotine (NICODERM CQ - DOSED IN MG/24 HR) 7 mg/24hr patch Place 1 patch (7 mg total) onto the skin daily. 28 patch 3   omeprazole (PRILOSEC) 40 MG capsule TAKE 1 CAPSULE BY MOUTH TWICE A DAY 180 capsule 3   ondansetron (ZOFRAN) 8 MG tablet Take 1 tablet (8 mg total) by mouth every 8 (eight)  hours as needed for nausea or vomiting. 90 tablet 1   oxyCODONE (ROXICODONE) 15 MG immediate release tablet Take 15 mg by mouth.     sucralfate (CARAFATE) 1 GM/10ML suspension TAKE 10 MLS (1 G TOTAL) BY MOUTH 4 (FOUR) TIMES DAILY. 840 mL 0   SUMAtriptan (IMITREX) 25 MG tablet Take 1 tablet by mouth as needed.     Tiotropium Bromide Monohydrate (SPIRIVA RESPIMAT) 2.5 MCG/ACT AERS INHALE 1 PUFF BY MOUTH INTO LUNGS ONCE DAILY 4 g 0   No current facility-administered medications for this visit.    Review of Systems  Constitutional:  Negative for chills and fever.  HENT:  Negative for congestion, ear discharge, ear pain, hearing loss, sinus pain and sore throat.   Eyes:  Positive for redness. Negative for blurred vision and double vision.  Respiratory:  Positive for cough, shortness of breath and wheezing.   Cardiovascular:  Negative for chest pain, palpitations and leg swelling.  Gastrointestinal:  Positive for nausea. Negative for abdominal pain, blood in stool, constipation, diarrhea, heartburn, melena and vomiting.  Genitourinary:  Positive for frequency and urgency. Negative for dysuria, flank pain and hematuria.       Positive for pelvic pain, pain with intercourse  Musculoskeletal:  Positive for joint pain. Negative for back pain.       Positive for muscle weakness  Skin:  Negative for itching and rash.  Neurological:  Positive for dizziness, tingling and headaches. Negative for tremors, sensory change, speech change, focal weakness, seizures, loss of consciousness and weakness.  Endo/Heme/Allergies:  Negative for environmental allergies. Does not bruise/bleed easily.  Psychiatric/Behavioral:  Negative for depression, hallucinations, memory loss, substance abuse and suicidal ideas. The patient is not nervous/anxious and does not have insomnia.        Positive for anxiety      Objective:  Objective   Vitals:   10/22/22 1422  BP: 122/74  Pulse: 74  Weight: 189 lb 9.6 oz (86 kg)   Height: 5\' 4"  (1.626 m)   Body mass index is 32.54 kg/m. Constitutional: Well nourished, well developed female in no acute distress.  HEENT: normal Skin: Warm and dry.  Cardiovascular: Regular rate and rhythm.  Extremity:  no edema   Respiratory:  Normal respiratory effort Abdomen: soft, nontender, nondistended, no abnormal masses Psych: Alert and Oriented x3. No memory deficits. Normal mood and affect.   Pelvic exam:  is limited by body habitus EGBUS: within normal limits Vagina: within normal limits and with normal mucosa  Cervix: normal appearance Uterus: no masses, non tender to palpation Adnexa: tenderness to palpation left>right, no masses palpated   Data: ULTRASOUND REPORT   Location: [facility] Date of Service: 10/22/2022    Indications: Ct saw lt ov cyst - Lt sided pelvic pain x6 months Findings:  The uterus is anteverted and measures 5.79 x 4.37 x 2.61 cm. Echo texture is homogenous without evidence of focal masses.   The Endometrium measures 1.05 mm.   Right Ovary measures 3.31 x 2.74 x 1.91 cm. It is normal in appearance. Small simple cyst seen measuring;1.83 x 1.52 x 1.68 cm Left Ovary measures 3.82 x 2.9 x 2.12 cm. It is normal in appearance. Small simple cyst seen measuring; 1.79 x 1.90 x 1.82 cm Survey of the adnexa demonstrates no adnexal masses. There is no free fluid in the cul de sac.   Impression: 1. Bilateral small simple cysts seen on the ovaries 2. Bilateral doppler flow to ovaries   Recommendations: 1.Clinical correlation with the patient's History and Physical Exam. 2. Follow up with provider    Waldo Laine, RT Assessment/Plan:    56 y.o.G0P0 female cervical cancer screening, evaluation of pelvic pain- small bilateral ovarian cysts on ultrasound  Follow up PRN for worsening symptoms and with results of PAP smear   Tresea Mall, CNM [facility] 10/23/2022 6:14 PM

## 2022-10-23 NOTE — Patient Outreach (Signed)
Medicaid Managed Care   Nurse Care Manager Note  10/23/2022 Name:  Sara Munoz MRN:  [id number] DOB:  [birth year]  Sara Munoz is an 57 y.o. year old female who is a primary patient of [name], Tawanna Cooler, MD.  The Medicaid Managed Care Coordination team was consulted for assistance with:    Chronic healthcare management needs, GERD, CAD, HTN, tobacco use, COPD, OSA, anxiety, chronic pain, osteoarhtritis  Sara Munoz was given information about Medicaid Managed Care Coordination team services today. Sara Munoz Patient agreed to services and verbal consent obtained.  Engaged with patient by telephone for follow up visit in response to provider referral for case management and/or care coordination services.   Assessments/Interventions:  Review of past medical history, allergies, medications, health status, including review of consultants reports, laboratory and other test data, was performed as part of comprehensive evaluation and provision of chronic care management services.  SDOH (Social Determinants of Health) assessments and interventions performed: SDOH Interventions    Flowsheet Row Patient Outreach Telephone from 10/23/2022 in [place] POPULATION [facility] DEPARTMENT Patient Outreach Telephone from 09/12/2022 in [place] POPULATION HEALTH DEPARTMENT Patient Outreach Telephone from 05/14/2022 in [place] POPULATION HEALTH DEPARTMENT Patient Outreach Telephone from 04/15/2022 in [facility] Care Coordination Patient Outreach Telephone from 03/15/2022 in [facility] Care Coordination Patient Outreach Telephone from 01/30/2022 in [facility] Coordination  SDOH Interventions        Food Insecurity Interventions Intervention Not Indicated -- -- -- -- Intervention Not Indicated  Housing Interventions -- -- Intervention Not Indicated -- -- --  Transportation Interventions -- -- -- Intervention Not Indicated --  --  Utilities Interventions -- -- -- -- Intervention Not Indicated --  Alcohol Usage Interventions -- -- -- -- Intervention Not Indicated (Score <7) --  Financial Strain Interventions -- -- -- Intervention Not Indicated -- --  Physical Activity Interventions -- Intervention Not Indicated -- -- -- --  Stress Interventions -- -- Other (Comment)  [sees Psychiatrist and counselor] -- -- --     Care Plan  Allergies  Allergen Reactions   Cyclobenzaprine Hives, Swelling and Rash    Facial/lip swelling      Levofloxacin Hives, Swelling and Rash   Chlorpheniramine Maleate     Other reaction(s): Unknown   Phenergan [Promethazine Hcl] Other (See Comments)    Agitation.    Phenylephrine Hcl Other (See Comments)   Toradol [Ketorolac Tromethamine] Swelling and Other (See Comments)    Facial/tongue swelling    Zoloft [Sertraline Hcl] Swelling    Tongue swelling      Cephalexin Hives and Rash   Ketorolac Rash   Tramadol Hives, Swelling, Other (See Comments) and Rash    Lip swelling    Medications Reviewed Today     Reviewed by [name], RN (Registered Nurse) on 10/23/22 at 1603  Med List Status: <None>   Medication Order Taking? Sig Documenting Provider Last Dose Status Informant  albuterol (PROVENTIL) (2.5 MG/3ML) 0.083% nebulizer solution [id number] No Take 3 mLs (2.5 mg total) by nebulization every 6 (six) hours as needed for wheezing or shortness of breath. [name], [name], MD Taking Active   albuterol (VENTOLIN HFA) 108 (90 Base) MCG/ACT inhaler [id number] No INHALE TWO PUFFS BY MOUTH INTO LUNGS EVERY 6 HOURS AS NEEDED FOR SHORTNESS OF BREATH OR wheezing [name], [name], MD Taking Active   amLODipine (NORVASC) 5 MG tablet [id number] No Take 1 tablet (5 mg total) by mouth daily. [name], [place],  MD Taking Active   aspirin EC 81 MG tablet [id number] No Take 81 mg by mouth daily. Swallow whole. [provider] Taking Active   atorvastatin (LIPITOR)  40 MG tablet [id number] No Take 1 tablet (40 mg total) by mouth every evening. Debbe Odea, MD Taking Active   docusate sodium (COLACE) 100 MG capsule [id number] No Take 100 mg by mouth 2 (two) times daily. [provider] Taking Active Self  gabapentin (NEURONTIN) 600 MG tablet [id number] No Take 600 mg by mouth 4 (four) times daily. [provider] Taking Active Self  lactulose (CHRONULAC) 10 GM/15ML solution [id number] No Take 30 mLs by mouth in the morning, at noon, in the evening, and at bedtime. [provider] Taking Active   LINZESS 290 MCG CAPS capsule [id number] No Take 290 mcg by mouth every morning. [provider] Taking Active   lisinopril (ZESTRIL) 20 MG tablet [id number] No Take 1 tablet (20 mg total) by mouth every evening. Debbe Odea, MD Taking Active   metoprolol tartrate (LOPRESSOR) 100 MG tablet [id number] No Take one tablet (100 mg) by mouth two hours prior to Cardiac CTA Debbe Odea, MD Taking Active   metoprolol tartrate (LOPRESSOR) 25 MG tablet [id number] No TAKE ONE TABLET BY MOUTH TWICE DAILY [name], [name], MD Taking Active   mometasone (ELOCON) 0.1 % cream [id number]  Apply to area of irritation BID/day for 7 days max. Merita Norton T, FNP  Active   mometasone-formoterol [facility]) 100-5 MCG/ACT Sandrea Matte [id number] No Inhale 2 puffs into the lungs daily. [name], [name], MD Taking Active   naloxone [facility]) 4 MG/0.1ML LIQD nasal spray kit [id number] No Place 1 spray into the nose as needed (opioid overdose). [provider] Taking Active Self           Med Note Richardean Canal May 03, 2021 11:23 AM)    Naloxone HCl [facility]) [id number] No Inject 1 Dose as directed as needed (opioid overdose). [provider] Taking Active Self           Med Note Richardean Canal May 03, 2021 11:32 AM)    nicotine (NICODERM CQ - DOSED IN MG/24 HR) 7 mg/24hr patch [id number] No Place 1 patch (7 mg  total) onto the skin daily. [name], Tawanna Cooler, MD Taking Active   omeprazole (PRILOSEC) 40 MG capsule [id number] No TAKE 1 CAPSULE BY MOUTH TWICE A DAY [name], MD Taking Active   ondansetron (ZOFRAN) 8 MG tablet [id number]  Take 1 tablet (8 mg total) by mouth every 8 (eight) hours as needed for nausea or vomiting. Jacky Kindle, FNP  Active   oxyCODONE (ROXICODONE) 15 MG immediate release tablet [id number] No Take 15 mg by mouth. [provider] Taking Active   sucralfate (CARAFATE) 1 GM/10ML suspension [id number] No TAKE 10 MLS (1 G TOTAL) BY MOUTH 4 (FOUR) TIMES DAILY. [name], MD Taking Active   SUMAtriptan (IMITREX) 25 MG tablet [id number] No Take 1 tablet by mouth as needed. [provider] Taking Active   Tiotropium Bromide Monohydrate (SPIRIVA RESPIMAT) 2.5 MCG/ACT AERS [id number] No INHALE 1 PUFF BY MOUTH INTO LUNGS ONCE DAILY [name], [name], MD Taking Active            Patient Active Problem List   Diagnosis Date Noted   Atopic dermatitis of both hands 09/12/2022   Headache disorder 08/13/2022   Encounter for annual physical examination excluding gynecological examination in a patient older than 17 years 04/03/2022  Essential hypertension 03/11/2022   Lumbar spondylosis 02/12/2022   Chronic migraine w/o aura w/o status migrainosus, not intractable 07/09/2021   Obstructive sleep apnea 07/09/2021   Body mass index (BMI) 32.0-32.9, adult 06/07/2021   Pseudoarthrosis of cervical spine (HCC) 05/14/2021   Lumbar radiculopathy 05/23/2020   Sacroiliac inflammation (HCC) 11/25/2019   Sepsis (HCC) 10/19/2018   Severe sepsis (HCC) 10/19/2018   Cervical radiculopathy 02/12/2018   Chronic migraine 02/12/2018   Paresthesia 08/29/2017   Neck pain 08/29/2017   Daily headache 08/29/2017   Chronic active hepatitis (HCC) 07/29/2017   Neurogenic pain 07/31/2016   Chronic pain syndrome 06/05/2016   Carotid artery stenosis 02/27/2016   Chronic  constipation 02/27/2016   Chronic nausea 02/27/2016   Hypercholesterolemia 02/27/2016   Osteoarthritis 02/27/2016   PTSD (post-traumatic stress disorder) 02/27/2016   TIA (transient ischemic attack) 02/27/2016   Long term current use of opiate analgesic 02/27/2016   Long term prescription opiate use 02/27/2016   Encounter for pain management consult 02/27/2016   Chronic hip pain (Location of Tertiary source of pain) (Bilateral) (L>R) 02/27/2016   Chronic knee pain (Bilateral) (L>R) 02/27/2016   Chronic shoulder pain (Bilateral) (L>R) 02/27/2016   Chronic sacroiliac joint pain (Bilateral) (L>R) 02/27/2016   Chronic low back pain (Location of Primary Source of Pain) (Bilateral) (L>R) 02/27/2016   Chronic lower extremity pain (Location of Secondary source of pain) (Bilateral) (L>R) 02/27/2016   Osteoarthritis of hip (Bilateral) (L>R) 02/27/2016   Chronic neck pain (posterior midline) (Bilateral) (L>R) 02/27/2016   Cervicogenic headache (Bilateral) (L>R) 02/27/2016   Occipital headache (Bilateral) (L>R) 02/27/2016   Chronic upper extremity pain (Bilateral) (L>R) 02/27/2016   Chronic Cervical radicular pain (Bilateral) (L>R) 02/27/2016   Chronic lumbar radicular pain (Right) (L5 dermatome) 02/27/2016   Lumbar facet syndrome (Bilateral) (L>R) 02/27/2016   Long term prescription benzodiazepine use 02/27/2016   Hypertension    Chronic obstructive pulmonary disease (HCC) 10/09/2015   GERD (gastroesophageal reflux disease) 10/09/2015   Non-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/2014 08/02/2015   Hyperlipidemia 08/02/2015   Costochondritis 08/02/2015   Drug abuse, 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   GAD (generalized anxiety disorder) 09/03/2014   Narcotic dependence (HCC) 09/03/2014   PVD (peripheral vascular disease) (HCC) 09/03/2014   Smoker 09/03/2014   Cigarette nicotine dependence with nicotine-induced disorder 07/08/2014   Anemia of chronic disease 03/19/2014   Narcotic abuse, continuous (HCC) 03/19/2014    Polysubstance abuse (HCC) 03/19/2014   MSSA (methicillin susceptible Staphylococcus aureus) septicemia (HCC) 03/07/2014   Hypomagnesemia 01/04/2014   Chronic cough 09/04/2011   Sleep apnea, obstructive 09/04/2011   Sinusitis, acute 09/04/2011   Conditions to be addressed/monitored per PCP order:  Chronic healthcare management needs, GERD, CAD, HTN, tobacco use, COPD, OSA, anxiety, chronic pain, osteoarhtritis  Care Plan : RN Care Manager Plan of Care  Updates made by [name], RN since 10/23/2022 12:00 AM     Problem: Health Promotion or Disease Self-Management (General Plan of Care)      Long-Range Goal: Chronic Disease Management   Start Date: 09/12/2022  Expected End Date: 12/13/2022  Priority: High  Note:   Current Barriers:  Knowledge Deficits related to plan of care for management of GERD, CAD, HTN, PVD, TIA, migranes, COPD, OSA, tobacco use, osteoarthritis, chronic pain, anxiety  Chronic Disease Management support and education needs related to  GERD, CAD, HTN, PVD, TIA, migranes, COPD, OSA, tobacco use, osteoarthritis, chronic pain, anxiety  10/23/22:  patient to have surgery on cervical spine 5/14-shoulder surgery postponed.  Patient  with ongoing nausea-to schedule an appt with GI-also to purchase some Boost, etc for nutritional support.  Upcoming [facility] appt 5/10-no change in smoking-is trying to stop on her own, discussed smoking cessation and states she will f/u with PCP.  RNCM Clinical Goal(s):  Patient will verbalize understanding of plan for management of GERD, CAD, HTN, PVD, TIA, migranes, COPD, OSA, tobacco use, osteoarthritis, chronic pain, anxiety   as evidenced by patient report verbalize basic understanding of  GERD, CAD, HTN, PVD, TIA, migranes, COPD, OSA, tobacco use, osteoarthritis, chronic pain, anxiety  disease process and self health management plan as evidenced by patient report take all medications exactly as prescribed and will call provider for medication  related questions as evidenced by patient report  demonstrate understanding of rationale for each prescribed medication as evidenced by patient report attend all scheduled medical appointments as evidenced by patient report and EMR review demonstrate Ongoing adherence to prescribed treatment plan for  GERD, CAD, HTN, PVD, TIA, migranes, COPD, OSA, tobacco use, osteoarthritis, chronic pain, anxiety as evidenced by patient report continue to work with RN Care Manager to address care management and care coordination needs related to GERD, CAD, HTN, PVD, TIA, migranes, COPD, OSA, tobacco use, osteoarthritis, chronic pain, anxiety  as evidenced by adherence to CM Team Scheduled appointments through collaboration with RN Care manager, provider, and care team.   Interventions: Inter-disciplinary care team collaboration (see longitudinal plan of care) Evaluation of current treatment plan related to  self management and patient's adherence to plan as established by provider  CAD Interventions: (Status:  New goal.) Long Term Goal Assessed understanding of CAD diagnosis Medications reviewed including medications utilized in CAD treatment plan Counseled on importance of regular laboratory monitoring as prescribed Reviewed Importance of taking all medications as prescribed Reviewed Importance of attending all scheduled provider appointments Advised to report any changes in symptoms or exercise tolerance Screening for signs and symptoms of depression related to chronic disease state Assessed social determinant of health barriers  COPD Interventions:  (Status:  New goal.) Long Term Goal Advised patient to self assesses COPD action plan zone and make appointment with provider if in the yellow zone for 48 hours without improvement Advised patient to engage in light exercise as tolerated 3-5 days a week to aid in the the management of COPD Provided education about and advised patient to utilize infection  prevention strategies to reduce risk of respiratory infection Discussed the importance of adequate rest and management of fatigue with COPD Assessed social determinant of health barriers  Hypertension Interventions:  (Status:  New goal.) Long Term Goal Last practice recorded BP readings:  BP Readings from Last 3 Encounters:  09/12/22 116/75  08/13/22 124/77  07/19/22 100/62  10/23/22         130/78  Most recent eGFR/CrCl:  Lab Results  Component Value Date   EGFR 83 04/08/2022    No components found for: "CRCL"  Evaluation of current treatment plan related to hypertension self management and patient's adherence to plan as established by provider Reviewed medications with patient and discussed importance of compliance Discussed plans with patient for ongoing care management follow up and provided patient with direct contact information for care management team Advised patient, providing education and rationale, to monitor blood pressure daily and record, calling PCP for findings outside established parameters Reviewed scheduled/upcoming provider appointments including:  Discussed complications of poorly controlled blood pressure such as heart disease, stroke, circulatory complications, vision complications, kidney impairment, sexual dysfunction Assessed social determinant of  health barriers  Pain Interventions:  (Status:  New goal.) Long Term Goal Pain assessment performed Medications reviewed Reviewed provider established plan for pain management Discussed importance of adherence to all scheduled medical appointments Counseled on the importance of reporting any/all new or changed pain symptoms or management strategies to pain management provider Advised patient to report to care team affect of pain on daily activities Reviewed with patient prescribed pharmacological and nonpharmacological pain relief strategies Assessed social determinant of health barriers  Patient  Goals/Self-Care Activities: Take all medications as prescribed Attend all scheduled provider appointments Call pharmacy for medication refills 3-7 days in advance of running out of medications Perform all self care activities independently  Perform IADL's (shopping, preparing meals, housekeeping, managing finances) independently Call provider office for new concerns or questions   Follow Up Plan:  The patient has been provided with contact information for the care management team and has been advised to call with any health related questions or concerns.  The care management team will reach out to the patient again over the next 30 business  days.   Follow Up:  Patient agrees to Care Plan and Follow-up.  Plan: The Managed Medicaid care management team will reach out to the patient again over the next 30 business  days. and The  Patient has been provided with contact information for the Managed Medicaid care management team and has been advised to call with any health related questions or concerns.  Date/time of next scheduled RN care management/care coordination outreach: 12/02/22 at 315

## 2022-10-23 NOTE — Patient Instructions (Signed)
Hi Sara Munoz, thank you for calling me back, I hope your surgery goes well!  Sara Munoz was given information about Medicaid Managed Care team care coordination services as a part of their [place] Medicaid benefit. [name] verbally consented to engagement with the [facility] Managed Care team.   If you are experiencing a medical emergency, please call 911 or report to your local emergency department or urgent care.   If you have a non-emergency medical problem during routine business hours, please contact your provider's office and ask to speak with a nurse.   For questions related to your [place] health plan, please call: [phone]  If you would like to schedule transportation through your [place] plan, please call the following number at least 2 days in advance of your appointment: [phone].   There is no limit to the number of trips during the year between medical appointments, healthcare facilities, or pharmacies. Transportation must be scheduled at least 2 business days before but not more than thirty 30 days before of your appointment.  Call the Behavioral Health Crisis Line at [phone], at any time, 24 hours a day, 7 days a week. If you are in danger or need immediate medical attention call 911.  If you would like help to quit smoking, call 1-800-QUIT-NOW (502 133 4930) OR Espaol: 1-855-Djelo-Ya (4-132-440-1027) o para ms informacin haga clic aqu or Text READY to 253-664 to register via text  Sara Munoz - following are the goals we discussed in your visit today:  Timeframe:  Long-Range Goal Priority:  High Start Date:       12/20/20                      Expected End Date:   ongoing             - schedule appointment for flu shot - schedule appointment for vaccines needed due to my age or health - schedule recommended health tests (blood work, mammogram, colonoscopy, pap test) - schedule and keep appointment for  annual check-up   Why is this important?   Screening tests can find diseases early when they are easier to treat.  Your doctor or nurse will talk with you about which tests are important for you.  Getting shots for common diseases like the flu and shingles will help prevent them.   10/23/22:  patient seen and evaluated by GYN 4/30, had ECHO 4/29 and has CARDS appt 5/10  Patient verbalizes understanding of instructions and care plan provided today and agrees to view in MyChart. Active MyChart status and patient understanding of how to access instructions and care plan via MyChart confirmed with patient.     The Managed Medicaid care management team will reach out to the patient again over the next 30 business  days.  The  Patient  has been provided with contact information for the Managed Medicaid care management team and has been advised to call with any health related questions or concerns.   Kathi Der RN, BSN [place]  [facility] Care Management Coordinator - Managed Medicaid High Risk [phone]   Following is a copy of your plan of care:  Care Plan : RN Care Manager Plan of Care  Updates made by [name], RN since 10/23/2022 12:00 AM     Problem: Health Promotion or Disease Self-Management (General Plan of Care)      Long-Range Goal: Chronic Disease Management   Start Date: 09/12/2022  Expected End Date: 12/13/2022  Priority: High  Note:   Current Barriers:  Knowledge Deficits related to plan of care for management of GERD, CAD, HTN, PVD, TIA, migranes, COPD, OSA, tobacco use, osteoarthritis, chronic pain, anxiety  Chronic Disease Management support and education needs related to  GERD, CAD, HTN, PVD, TIA, migranes, COPD, OSA, tobacco use, osteoarthritis, chronic pain, anxiety  10/23/22:  patient to have surgery on cervical spine 5/14-shoulder surgery postponed.  Patient with ongoing nausea-to schedule an appt with GI-also to purchase some Boost, etc for  nutritional support.  Upcoming CARDS appt 5/10-no change in smoking-is trying to stop on her own, discussed smoking cessation and states she will f/u with PCP.  RNCM Clinical Goal(s):  Patient will verbalize understanding of plan for management of GERD, CAD, HTN, PVD, TIA, migranes, COPD, OSA, tobacco use, osteoarthritis, chronic pain, anxiety   as evidenced by patient report verbalize basic understanding of  GERD, CAD, HTN, PVD, TIA, migranes, COPD, OSA, tobacco use, osteoarthritis, chronic pain, anxiety  disease process and self health management plan as evidenced by patient report take all medications exactly as prescribed and will call provider for medication related questions as evidenced by patient report  demonstrate understanding of rationale for each prescribed medication as evidenced by patient report attend all scheduled medical appointments as evidenced by patient report and EMR review demonstrate Ongoing adherence to prescribed treatment plan for  GERD, CAD, HTN, PVD, TIA, migranes, COPD, OSA, tobacco use, osteoarthritis, chronic pain, anxiety as evidenced by patient report continue to work with RN Care Manager to address care management and care coordination needs related to GERD, CAD, HTN, PVD, TIA, migranes, COPD, OSA, tobacco use, osteoarthritis, chronic pain, anxiety  as evidenced by adherence to CM Team Scheduled appointments through collaboration with RN Care manager, provider, and care team.   Interventions: Inter-disciplinary care team collaboration (see longitudinal plan of care) Evaluation of current treatment plan related to  self management and patient's adherence to plan as established by provider  CAD Interventions: (Status:  New goal.) Long Term Goal Assessed understanding of CAD diagnosis Medications reviewed including medications utilized in CAD treatment plan Counseled on importance of regular laboratory monitoring as prescribed Reviewed Importance of taking all  medications as prescribed Reviewed Importance of attending all scheduled provider appointments Advised to report any changes in symptoms or exercise tolerance Screening for signs and symptoms of depression related to chronic disease state Assessed social determinant of health barriers  COPD Interventions:  (Status:  New goal.) Long Term Goal Advised patient to self assesses COPD action plan zone and make appointment with provider if in the yellow zone for 48 hours without improvement Advised patient to engage in light exercise as tolerated 3-5 days a week to aid in the the management of COPD Provided education about and advised patient to utilize infection prevention strategies to reduce risk of respiratory infection Discussed the importance of adequate rest and management of fatigue with COPD Assessed social determinant of health barriers  Hypertension Interventions:  (Status:  New goal.) Long Term Goal Last practice recorded BP readings:  BP Readings from Last 3 Encounters:  09/12/22 116/75  08/13/22 124/77  07/19/22 100/62  10/23/22         130/78  Most recent eGFR/CrCl:  Lab Results  Component Value Date   EGFR 83 04/08/2022    No components found for: "CRCL"  Evaluation of current treatment plan related to hypertension self management and patient's adherence to plan as established by provider Reviewed medications  with patient and discussed importance of compliance Discussed plans with patient for ongoing care management follow up and provided patient with direct contact information for care management team Advised patient, providing education and rationale, to monitor blood pressure daily and record, calling PCP for findings outside established parameters Reviewed scheduled/upcoming provider appointments including:  Discussed complications of poorly controlled blood pressure such as heart disease, stroke, circulatory complications, vision complications, kidney impairment, sexual  dysfunction Assessed social determinant of health barriers  Pain Interventions:  (Status:  New goal.) Long Term Goal Pain assessment performed Medications reviewed Reviewed provider established plan for pain management Discussed importance of adherence to all scheduled medical appointments Counseled on the importance of reporting any/all new or changed pain symptoms or management strategies to pain management provider Advised patient to report to care team affect of pain on daily activities Reviewed with patient prescribed pharmacological and nonpharmacological pain relief strategies Assessed social determinant of health barriers  Patient Goals/Self-Care Activities: Take all medications as prescribed Attend all scheduled provider appointments Call pharmacy for medication refills 3-7 days in advance of running out of medications Perform all self care activities independently  Perform IADL's (shopping, preparing meals, housekeeping, managing finances) independently Call provider office for new concerns or questions   Follow Up Plan:  The patient has been provided with contact information for the care management team and has been advised to call with any health related questions or concerns.  The care management team will reach out to the patient again over the next 30 business  days.

## 2022-10-25 ENCOUNTER — Telehealth (HOSPITAL_COMMUNITY): Payer: Self-pay | Admitting: *Deleted

## 2022-10-25 MED ORDER — METOPROLOL TARTRATE 100 MG PO TABS: ORAL_TABLET | ORAL | 0 refills | Status: DC

## 2022-10-25 MED ORDER — DILTIAZEM HCL 60 MG PO TABS: ORAL_TABLET | ORAL | 0 refills | Status: DC

## 2022-10-25 NOTE — Telephone Encounter (Signed)
Reaching out to patient to offer assistance regarding upcoming cardiac imaging study; pt verbalizes understanding of appt date/time, parking situation and where to check in, pre-test NPO status and medications ordered, and verified current allergies; name and call back number provided for further questions should they arise  Larey Brick RN Navigator Cardiac Imaging [facility] [phone] office [phone] cell  Patient to take 100mg  metoprolol tartrate and 60mg  cardizem two hours prior to her cardiac CT scan. She is to hold her lisinopril and amlodipine x 2 days prior to her cardiac CT scan.

## 2022-10-28 ENCOUNTER — Other Ambulatory Visit: Payer: Self-pay | Admitting: Advanced Practice Midwife

## 2022-10-28 ENCOUNTER — Ambulatory Visit
Admission: RE | Admit: 2022-10-28 | Discharge: 2022-10-28 | Disposition: A | Discharge status: Home / Self Care | Payer: 59 | Source: Ambulatory Visit | Attending: Cardiology | Admitting: Cardiology

## 2022-10-28 DIAGNOSIS — A599 Trichomoniasis, unspecified: Secondary | ICD-10-CM

## 2022-10-28 DIAGNOSIS — R079 Chest pain, unspecified: Secondary | ICD-10-CM | POA: Insufficient documentation

## 2022-10-28 DIAGNOSIS — I2510 Atherosclerotic heart disease of native coronary artery without angina pectoris: Secondary | ICD-10-CM | POA: Diagnosis not present

## 2022-10-28 DIAGNOSIS — I700 Atherosclerosis of aorta: Secondary | ICD-10-CM | POA: Insufficient documentation

## 2022-10-28 LAB — CYTOLOGY - PAP
Comment: NEGATIVE
Diagnosis: NEGATIVE
Diagnosis: REACTIVE
High risk HPV: NEGATIVE

## 2022-10-28 MED ORDER — IOHEXOL 350 MG/ML SOLN
100.0000 mL | Freq: Once | INTRAVENOUS | Status: AC | PRN
  Administered 2022-10-28: 100 mL via INTRAVENOUS

## 2022-10-28 MED ORDER — METRONIDAZOLE 500 MG PO TABS
2000.0000 mg | ORAL_TABLET | Freq: Once | ORAL | 0 refills | Status: AC
Start: 2022-10-28 — End: 2022-10-28

## 2022-10-28 MED ORDER — NITROGLYCERIN 0.4 MG SL SUBL
0.4000 mg | SUBLINGUAL_TABLET | Freq: Once | SUBLINGUAL | Status: AC
  Administered 2022-10-28: 0.4 mg via SUBLINGUAL
  Filled 2022-10-28: qty 25

## 2022-10-28 NOTE — Progress Notes (Signed)
Patient tolerated procedure well. Ambulate w/o difficulty. Denies any lightheadedness or being dizzy. Pt denies any pain at this time. Sitting in chair. Pt is encouraged to drink additional water throughout the day and reason explained to patient. Patient verbalized understanding and all questions answered. ABC intact. No further needs at this time. Discharge from procedure area w/o issues. 

## 2022-10-28 NOTE — Progress Notes (Signed)
Rx metronidazole 2000 mg sent to treat trichomonal infection. Instructions given.

## 2022-10-29 NOTE — Progress Notes (Signed)
Surgical Instructions    Your procedure is scheduled on Tuesday, Nov 05, 2022.  Report to [facility] Main Entrance "A" at 5:30 A.M., then check in with the Admitting office.  Call this number if you have problems the morning of surgery:  [phone]   If you have any questions prior to your surgery date call [phone]: Open Monday-Friday 8am-4pm If you experience any cold or flu symptoms such as cough, fever, chills, shortness of breath, etc. between now and your scheduled surgery, please notify us at the above number     Remember:  Do not eat or drink after midnight the night before your surgery   Take these medicines the morning of surgery with A SIP OF WATER:  amLODipine (NORVASC)  gabapentin (NEURONTIN)  LINZESS  metoprolol tartrate (LOPRESSOR)  mometasone-formoterol (DULERA) 100-5 MCG/ACT AERO  omeprazole (PRILOSEC)  oxyCODONE (ROXICODONE)  Tiotropium Bromide Monohydrate (SPIRIVA RESPIMAT)    As Needed: albuterol (PROVENTIL) (2.5 MG/3ML) 0.083% nebulizer solution  albuterol (VENTOLIN HFA) 108 (90 Base) MCG/ACT inhaler  ondansetron (ZOFRAN)    Follow your surgeon's instructions on when to stop Aspirin.  If no instructions were given by your surgeon then you will need to call the office to get those instructions.    As of today, STOP taking any Aleve, Naproxen, Ibuprofen, Motrin, Advil, Goody's, BC's, all herbal medications, fish oil, and all vitamins.            Do NOT Smoke (Tobacco/Vaping)  24 hours prior to your procedure  If you use a CPAP at night, you may bring your mask for your overnight stay.   Contacts, glasses, hearing aids, dentures or partials may not be worn into surgery, please bring cases for these belongings   For patients admitted to the hospital, discharge time will be determined by your treatment team.   Patients discharged the day of surgery will not be allowed to drive home, and someone needs to stay with them for 24 hours.   SURGICAL  WAITING ROOM VISITATION Patients having surgery or a procedure may have no more than 2 support people in the waiting area - these visitors may rotate.   Children under the age of 60 must have an adult with them who is not the patient. If the patient needs to stay at the hospital during part of their recovery, the visitor guidelines for inpatient rooms apply. Pre-op nurse will coordinate an appropriate time for 1 support person to accompany patient in pre-op.  This support person may not rotate.   Please refer to [email or web address] for the visitor guidelines for Inpatients (after your surgery is over and you are in a regular room).    Oral Hygiene is also important to reduce your risk of infection.  Remember - BRUSH YOUR TEETH THE MORNING OF SURGERY WITH YOUR REGULAR TOOTHPASTE   [facility]- Preparing For Surgery  Before surgery, you can play an important role. Because skin is not sterile, your skin needs to be as free of germs as possible. You can reduce the number of germs on your skin by washing with CHG (chlorahexidine gluconate) Soap before surgery.  CHG is an antiseptic cleaner which kills germs and bonds with the skin to continue killing germs even after washing.     Please do not use if you have an allergy to CHG or antibacterial soaps. If your skin becomes reddened/irritated stop using the CHG.  Do not shave (including legs and underarms) for at least 48 hours prior to first CHG shower.  It is OK to shave your face.  Please follow these instructions carefully.     Shower the NIGHT BEFORE SURGERY and the MORNING OF SURGERY with CHG Soap.   If you chose to wash your hair, wash your hair first as usual with your normal shampoo. After you shampoo, rinse your hair and body thoroughly to remove the shampoo.  Then [employer] and genitals (private parts) with your normal soap and rinse thoroughly to remove soap.  After that Use CHG Soap as  you would any other liquid soap. You can apply CHG directly to the skin and wash gently with a scrungie or a clean washcloth.   Apply the CHG Soap to your body ONLY FROM THE NECK DOWN.  Do not use on open wounds or open sores. Avoid contact with your eyes, ears, mouth and genitals (private parts). Wash Face and genitals (private parts)  with your normal soap.   Wash thoroughly, paying special attention to the area where your surgery will be performed.  Thoroughly rinse your body with warm water from the neck down.  DO NOT shower/wash with your normal soap after using and rinsing off the CHG Soap.  Pat yourself dry with a CLEAN TOWEL.  Wear CLEAN PAJAMAS to bed the night before surgery  Place CLEAN SHEETS on your bed the night before your surgery  DO NOT SLEEP WITH PETS.   Day of Surgery:  Take a shower with CHG soap. Do not wear jewelry or makeup. Do not wear lotions, powders, perfumes or deodorant. Do not shave 48 hours prior to surgery.   Do not bring valuables to the hospital. Do not wear nail polish, gel polish, artificial nails, or any other type of covering on natural nails (fingers and toes) If you have artificial nails or gel coating that need to be removed by a nail salon, please have this removed prior to surgery. Artificial nails or gel coating may interfere with anesthesia's ability to adequately monitor your vital signs.  [facility] is not responsible for any belongings or valuables.   Wear Clean/Comfortable clothing the morning of surgery    Remember to brush your teeth WITH YOUR REGULAR TOOTHPASTE.    If you received a COVID test during your pre-op visit, it is requested that you wear a mask when out in public, stay away from anyone that may not be feeling well, and notify your surgeon if you develop symptoms. If you have been in contact with anyone that has tested positive in the last 10 days, please notify your surgeon.    Please read over the following fact  sheets that you were given.

## 2022-10-30 ENCOUNTER — Encounter (HOSPITAL_COMMUNITY): Payer: Self-pay

## 2022-10-30 ENCOUNTER — Encounter (HOSPITAL_COMMUNITY)
Admission: RE | Admit: 2022-10-30 | Discharge: 2022-10-30 | Disposition: A | Discharge status: Home / Self Care | Payer: 59 | Source: Ambulatory Visit | Attending: Neurological Surgery | Admitting: Neurological Surgery

## 2022-10-30 ENCOUNTER — Other Ambulatory Visit: Payer: Self-pay

## 2022-10-30 VITALS — BP 101/70 | HR 70 | Temp 98.3°F | Resp 18 | Ht 64.0 in | Wt 190.2 lb

## 2022-10-30 DIAGNOSIS — I119 Hypertensive heart disease without heart failure: Secondary | ICD-10-CM | POA: Insufficient documentation

## 2022-10-30 DIAGNOSIS — K219 Gastro-esophageal reflux disease without esophagitis: Secondary | ICD-10-CM | POA: Insufficient documentation

## 2022-10-30 DIAGNOSIS — J449 Chronic obstructive pulmonary disease, unspecified: Secondary | ICD-10-CM | POA: Diagnosis not present

## 2022-10-30 DIAGNOSIS — I2510 Atherosclerotic heart disease of native coronary artery without angina pectoris: Secondary | ICD-10-CM | POA: Diagnosis not present

## 2022-10-30 DIAGNOSIS — I340 Nonrheumatic mitral (valve) insufficiency: Secondary | ICD-10-CM | POA: Diagnosis not present

## 2022-10-30 DIAGNOSIS — Z01818 Encounter for other preprocedural examination: Secondary | ICD-10-CM

## 2022-10-30 DIAGNOSIS — E785 Hyperlipidemia, unspecified: Secondary | ICD-10-CM | POA: Diagnosis not present

## 2022-10-30 DIAGNOSIS — Z01812 Encounter for preprocedural laboratory examination: Secondary | ICD-10-CM | POA: Diagnosis present

## 2022-10-30 DIAGNOSIS — K732 Chronic active hepatitis, not elsewhere classified: Secondary | ICD-10-CM | POA: Diagnosis not present

## 2022-10-30 DIAGNOSIS — E871 Hypo-osmolality and hyponatremia: Secondary | ICD-10-CM | POA: Insufficient documentation

## 2022-10-30 DIAGNOSIS — D649 Anemia, unspecified: Secondary | ICD-10-CM | POA: Diagnosis not present

## 2022-10-30 HISTORY — DX: Headache, unspecified: R51.9

## 2022-10-30 LAB — COMPREHENSIVE METABOLIC PANEL
ALT: 15 U/L (ref 0–44)
AST: 20 U/L (ref 15–41)
Albumin: 3.6 g/dL (ref 3.5–5.0)
Alkaline Phosphatase: 73 U/L (ref 38–126)
Anion gap: 10 (ref 5–15)
BUN: 13 mg/dL (ref 6–20)
CO2: 23 mmol/L (ref 22–32)
Calcium: 8.9 mg/dL (ref 8.9–10.3)
Chloride: 101 mmol/L (ref 98–111)
Creatinine, Ser: 0.98 mg/dL (ref 0.44–1.00)
GFR, Estimated: >60 mL/min (ref >60)
Glucose, Bld: 90 mg/dL (ref 70–99)
Potassium: 4.2 mmol/L (ref 3.5–5.1)
Sodium: 134 mmol/L — ABNORMAL LOW (ref 135–145)
Total Bilirubin: 0.4 mg/dL (ref 0.3–1.2)
Total Protein: 6.5 g/dL (ref 6.5–8.1)

## 2022-10-30 LAB — TYPE AND SCREEN
ABO/RH(D): O POS
Antibody Screen: NEGATIVE

## 2022-10-30 LAB — CBC
HCT: 32.2 % — ABNORMAL LOW (ref 36.0–46.0)
Hemoglobin: 10.4 g/dL — ABNORMAL LOW (ref 12.0–15.0)
MCH: 24.2 pg — ABNORMAL LOW (ref 26.0–34.0)
MCHC: 32.3 g/dL (ref 30.0–36.0)
MCV: 75.1 fL — ABNORMAL LOW (ref 80.0–100.0)
Platelets: 226 10*3/uL (ref 150–400)
RBC: 4.29 MIL/uL (ref 3.87–5.11)
RDW: 17.4 % — ABNORMAL HIGH (ref 11.5–15.5)
WBC: 7.6 10*3/uL (ref 4.0–10.5)
nRBC: 0 % (ref 0.0–0.2)

## 2022-10-30 LAB — SURGICAL PCR SCREEN
MRSA, PCR: NEGATIVE
Staphylococcus aureus: NEGATIVE

## 2022-10-30 NOTE — Progress Notes (Signed)
PCP - Dr. Tawanna Cooler [name] Cardiologist - Dr. Debbe Odea  PPM/ICD - denies   Chest x-ray - 11/17/19 EKG - 07/19/22 Stress Test - 09/01/19 ECHO - 10/21/22 Cardiac Cath - 02/22/16  Sleep Study - denies   DM- denies  Blood Thinner Instructions: n/a Aspirin Instructions: f/u with surgeon  ERAS Protcol - no, NPO   COVID TEST- n/a   Anesthesia review: yes, cardiac hx (pt had to complete Echo and Coronary CTA per cardiology notes. Pt states she has an appt with Dr. Azucena Cecil on 5/10 to go over results.   Patient denies shortness of breath, fever, cough and chest pain at PAT appointment   All instructions explained to the patient, with a verbal understanding of the material. Patient agrees to go over the instructions while at home for a better understanding.  The opportunity to ask questions was provided.

## 2022-10-30 NOTE — Progress Notes (Signed)
Surgical Instructions    Your procedure is scheduled on Tuesday, Nov 05, 2022.  Report to [facility] Main Entrance "A" at 5:30 A.M., then check in with the Admitting office.  Call this number if you have problems the morning of surgery:  [phone]   If you have any questions prior to your surgery date call [phone]: Open Monday-Friday 8am-4pm If you experience any cold or flu symptoms such as cough, fever, chills, shortness of breath, etc. between now and your scheduled surgery, please notify us at the above number     Remember:  Do not eat or drink after midnight the night before your surgery   Take these medicines the morning of surgery with A SIP OF WATER:  amLODipine (NORVASC)  gabapentin (NEURONTIN)  LINZESS  metoprolol tartrate (LOPRESSOR)  mometasone-formoterol (DULERA) 100-5 MCG/ACT AERO  omeprazole (PRILOSEC)  oxyCODONE (ROXICODONE)  Tiotropium Bromide Monohydrate (SPIRIVA RESPIMAT)    As Needed: albuterol (PROVENTIL) (2.5 MG/3ML) 0.083% nebulizer solution  albuterol (VENTOLIN HFA) 108 (90 Base) MCG/ACT inhaler  ondansetron (ZOFRAN)    Follow your surgeon's instructions on when to stop Aspirin.  If no instructions were given by your surgeon then you will need to call the office to get those instructions.    As of today, STOP taking any Aleve, Naproxen, Ibuprofen, Motrin, Advil, Goody's, BC's, all herbal medications, fish oil, and all vitamins.            Do NOT Smoke (Tobacco/Vaping)  24 hours prior to your procedure  If you use a CPAP at night, you may bring your mask for your overnight stay.   Contacts, glasses, hearing aids, dentures or partials may not be worn into surgery, please bring cases for these belongings   For patients admitted to the hospital, discharge time will be determined by your treatment team.   Patients discharged the day of surgery will not be allowed to drive home, and someone needs to stay with them for 24 hours.   SURGICAL  WAITING ROOM VISITATION Patients having surgery or a procedure may have no more than 2 support people in the waiting area - these visitors may rotate.   Children under the age of 63 must have an adult with them who is not the patient. If the patient needs to stay at the hospital during part of their recovery, the visitor guidelines for inpatient rooms apply. Pre-op nurse will coordinate an appropriate time for 1 support person to accompany patient in pre-op.  This support person may not rotate.   Please refer to [email or web address] for the visitor guidelines for Inpatients (after your surgery is over and you are in a regular room).    Oral Hygiene is also important to reduce your risk of infection.  Remember - BRUSH YOUR TEETH THE MORNING OF SURGERY WITH YOUR REGULAR TOOTHPASTE     Day of Surgery:  Take a shower with CHG soap. Do not wear jewelry or makeup. Do not wear lotions, powders, perfumes or deodorant.  Do not bring valuables to the hospital. Do not wear nail polish, gel polish, artificial nails, or any other type of covering on natural nails (fingers and toes) If you have artificial nails or gel coating that need to be removed by a nail salon, please have this removed prior to surgery. Artificial nails or gel coating may interfere with anesthesia's ability to adequately monitor your vital signs.  [facility] is not responsible for any belongings or valuables.   Wear Clean/Comfortable clothing the morning of surgery  Remember to brush your teeth WITH YOUR REGULAR TOOTHPASTE.    If you received a COVID test during your pre-op visit, it is requested that you wear a mask when out in public, stay away from anyone that may not be feeling well, and notify your surgeon if you develop symptoms. If you have been in contact with anyone that has tested positive in the last 10 days, please notify your surgeon.    Please read over the  following fact sheets that you were given.

## 2022-10-31 NOTE — Progress Notes (Addendum)
Anesthesia Chart Review:  57 year old female with pertinent history including HTN, current smoker with associated COPD, hepatitis C (treated), GERD, nonobstructive CAD, HLD.  Nuclear stress test 08/2019 was nonischemic, low risk.  Echo 08/2019 showed EF 55 to 60%.  Prior left heart cath in 2017 showed mild nonobstructive disease.  Patient was last seen by cardiologist Dr. Azucena Cecil 07/19/2022 with complaint of chest pain on and off for the prior 6 months.  Echocardiogram and coronary CTA were ordered.  Echo showed EF 60 to 65%, normal RV function, mild MR. Coronary CTA showed elevated coronary calcium score of 1092 with mild nonobstructive CAD. Dr. Azucena Cecil recommended continue current medical therapy.  History of C5-T1 posterior fusion.  Preop labs reviewed, mild hyponatremia sodium 134, mild anemia hemoglobin 10.4, otherwise unremarkable.  EKG 07/19/2022: NSR.  Rate 67.  Low-voltage QRS.  Coronary CTA 10/28/2022: IMPRESSION: 1. Coronary calcium score of 1092. This was 99th percentile for age and sex matched control.   2. Normal coronary origin with left dominance.   3. Mild LM, proximal LAD and LCx stenosis (25-49%).   4. Minimal RCA stenosis (<25%).   5. CAD-RADS 2. Mild non-obstructive CAD (25-49%). Consider non-atherosclerotic causes of chest pain. Consider preventive therapy and risk factor modification.  TTE 10/21/22:  1. Left ventricular ejection fraction, by estimation, is 60 to 65%. The  left ventricle has normal function. The left ventricle has no regional  wall motion abnormalities. There is mild left ventricular hypertrophy.  Left ventricular diastolic parameters  were normal.   2. Right ventricular systolic function is normal. The right ventricular  size is normal.   3. The mitral valve is normal in structure. Mild mitral valve  regurgitation.   4. The aortic valve was not well visualized. Aortic valve regurgitation  is not visualized. No aortic stenosis is present.    5. The inferior vena cava is normal in size with greater than 50%  respiratory variability, suggesting right atrial pressure of 3 mmHg.     [facility] Short Stay Center/Anesthesiology Phone [phone] 10/31/2022 11:51 AM

## 2022-11-01 ENCOUNTER — Ambulatory Visit: Payer: 59 | Attending: Cardiology | Admitting: Cardiology

## 2022-11-01 ENCOUNTER — Encounter: Payer: Self-pay | Admitting: Cardiology

## 2022-11-01 VITALS — BP 136/72 | HR 66 | Ht 64.0 in | Wt 190.8 lb

## 2022-11-01 DIAGNOSIS — I2510 Atherosclerotic heart disease of native coronary artery without angina pectoris: Secondary | ICD-10-CM

## 2022-11-01 DIAGNOSIS — I10 Essential (primary) hypertension: Secondary | ICD-10-CM

## 2022-11-01 DIAGNOSIS — E782 Mixed hyperlipidemia: Secondary | ICD-10-CM

## 2022-11-01 DIAGNOSIS — F17200 Nicotine dependence, unspecified, uncomplicated: Secondary | ICD-10-CM

## 2022-11-01 NOTE — Patient Instructions (Signed)
Medication Instructions:   Your physician recommends that you continue on your current medications as directed. Please refer to the Current Medication list given to you today.  *If you need a refill on your cardiac medications before your next appointment, please call your pharmacy*   Lab Work:  1. Your physician recommends that you return for lab work in: 4 months [facility] the [facility] mall. You will need to be fasting.  No appt is needed. Hours are M-F 7AM- 6 PM.  If you have labs (blood work) drawn today and your tests are completely normal, you will receive your results only by: MyChart Message (if you have MyChart) OR A paper copy in the mail If you have any lab test that is abnormal or we need to change your treatment, we will call you to review the results.   Testing/Procedures:  None Ordered   Follow-Up: At [facility], you and your health needs are our priority.  As part of our continuing mission to provide you with exceptional heart care, we have created designated Provider Care Teams.  These Care Teams include your primary Cardiologist (physician) and Advanced Practice Providers (APPs -  Physician Assistants and Nurse Practitioners) who all work together to provide you with the care you need, when you need it.  We recommend signing up for the patient portal called "MyChart".  Sign up information is provided on this After Visit Summary.  MyChart is used to connect with patients for Virtual Visits (Telemedicine).  Patients are able to view lab/test results, encounter notes, upcoming appointments, etc.  Non-urgent messages can be sent to your provider as well.   To learn more about what you can do with MyChart, go to [email or web address].    Your next appointment:   6 month(s)  Provider:   You may see Debbe Odea, MD or one of the following Advanced Practice Providers on your designated Care Team:   Nicolasa Ducking, NP Eula Listen, [name] Cadence Fransico Michael,  [name] Charlsie Quest, NP

## 2022-11-01 NOTE — Progress Notes (Addendum)
Cardiology Office Note:    Date:  11/21/2022   ID:  Sara Munoz, DOB September 02, 1965, MRN [id number]  PCP:  Ronnald Ramp, MD   [facility] Providers Cardiologist:  Debbe Odea, MD     Referring MD: Brett Albino*   Chief Complaint  Patient presents with   Follow-up    Discuss test results.  Patient denies new or acute cardiac problems/concerns today.      History of Present Illness:    Sara Munoz is a 57 y.o. female with a hx of nonobstructive CAD, hypertension, hyperlipidemia current smoker x 30+ years, COPD presenting for follow-up.  Previously seen due to symptoms of chest pain.  She still smokes.  Echo and coronary CTA obtained to evaluate any progression of CAD.  States feeling well, currently denies chest pain or shortness of breath.  Compliant with medications as prescribed.  Coronary CT 10/2022 mild nonobstructive CAD involving left main, proximal LAD, left circumflex. Echocardiogram showed normal EF 60 to 65%,  Prior notes/studies Myoview 08/2019 no ischemia, low risk study Echo 08/2019 EF 55 to 60% Left heart cath 2017 25% left main, 25% proximal left circumflex stenosis.  Past Medical History:  Diagnosis Date   Anemia    Anxiety    Asthma    Atypical chest pain 08/02/2015   CAD (coronary artery disease)    Chronic back pain    COPD (chronic obstructive pulmonary disease) (HCC)    Coronary artery disease    a. Mild-non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/2014   GERD (gastroesophageal reflux disease)    Headache    occasional migraines   Hepatitis C    per patient, "took medicines for it and no longer has it"   Hypercholesteremia    Hypertension    Hypokalemia    Hypomagnesemia 01/04/2014   MI (myocardial infarction) [facility])    Nerve pain    per patient, in the lower back   Opiate use 02/27/2016   Osteoarthritis    Pneumonia    Stroke [facility])     Past Surgical History:  Procedure Laterality Date   ANTERIOR CERVICAL  DECOMP/DISCECTOMY FUSION N/A 03/30/2018   Procedure: Cervical six-seven Anterior cervical decompression/discectomy/fusion;  Surgeon: Jadene Pierini, MD;  Location: [facility] OR;  Service: Neurosurgery;  Laterality: N/A;   ANTERIOR CERVICAL DECOMP/DISCECTOMY FUSION N/A 01/11/2019   Procedure: Cervical Five-Six Anterior cervical discectomy fusion,  Cervical Five to Cervical Seven anterior instrumented fusion;  Surgeon: Jadene Pierini, MD;  Location: [facility] OR;  Service: Neurosurgery;  Laterality: N/A;  Cervical Five-Six Anterior cervical discectomy fusion,  Cervical Five to Cervical Seven anterior instrumented fusion   CARDIAC CATHETERIZATION Left 02/22/2016   Procedure: Left Heart Cath and Coronary Angiography;  Surgeon: Alwyn Pea, MD;  Location: [facility] INVASIVE CV LAB;  Service: Cardiovascular;  Laterality: Left;   CATARACT EXTRACTION W/ INTRAOCULAR LENS IMPLANT Right    COLONOSCOPY WITH PROPOFOL N/A 09/19/2016   Procedure: COLONOSCOPY WITH PROPOFOL;  Surgeon: [name], MD;  Location: [facility] ENDOSCOPY;  Service: Endoscopy;  Laterality: N/A;   DIAGNOSTIC LAPAROSCOPY     ESOPHAGOGASTRODUODENOSCOPY (EGD) WITH PROPOFOL N/A 09/18/2017   Procedure: ESOPHAGOGASTRODUODENOSCOPY (EGD) WITH PROPOFOL;  Surgeon: [name], MD;  Location: [facility] ENDOSCOPY;  Service: Gastroenterology;  Laterality: N/A;   ESOPHAGOGASTRODUODENOSCOPY (EGD) WITH PROPOFOL N/A 08/10/2019   Procedure: ESOPHAGOGASTRODUODENOSCOPY (EGD) WITH PROPOFOL;  Surgeon: [name], MD;  Location: [facility] ENDOSCOPY;  Service: Endoscopy;  Laterality: N/A;   hemorroids     LUMBAR WOUND DEBRIDEMENT N/A 11/05/2022  Procedure: Thoracic one spinous process removal and wound revision;  Surgeon: Jadene Pierini, MD;  Location: [facility] OR;  Service: Neurosurgery;  Laterality: N/A;   NASAL SINUS SURGERY     ORIF FEMUR FRACTURE Left    ORIF TIBIA & FIBULA FRACTURES Right    OVARY SURGERY     removed multiple cysts   POSTERIOR CERVICAL FUSION/FORAMINOTOMY  N/A 05/14/2021   Procedure: Cervical five, Cervical six Laminectomy,foraminotomy with Cervical five-six Cervical six-seven Cervical seven-Thoracic one Posterior instrumented fusion;  Surgeon: Jadene Pierini, MD;  Location: [facility] OR;  Service: Neurosurgery;  Laterality: N/A;   SHOULDER ARTHROSCOPY WITH SUBACROMIAL DECOMPRESSION, ROTATOR CUFF REPAIR AND BICEP TENDON REPAIR Left 05/28/2022   Procedure: SHOULDER ARTHROSCOPY WITH DEBRIDEMENT, DECOMPRESSION, ROTATOR CUFF REPAIR AND BICEP TENDON REPAIR;  Surgeon: Christena Flake, MD;  Location: [facility] ORS;  Service: Orthopedics;  Laterality: Left;   TRANSFORAMINAL LUMBAR INTERBODY FUSION W/ MIS 1 LEVEL Right 05/23/2020   Procedure: Right Lumbar Five Sacral One Minimally invasive transforaminal lumbar interbody fusion;  Surgeon: Jadene Pierini, MD;  Location: [facility] OR;  Service: Neurosurgery;  Laterality: Right;  Right Lumbar Five Sacral One Minimally invasive transforaminal lumbar interbody fusion    Current Medications: Current Meds  Medication Sig   albuterol (PROVENTIL) (2.5 MG/3ML) 0.083% nebulizer solution Take 3 mLs (2.5 mg total) by nebulization every 6 (six) hours as needed for wheezing or shortness of breath.   albuterol (VENTOLIN HFA) 108 (90 Base) MCG/ACT inhaler INHALE TWO PUFFS BY MOUTH INTO LUNGS EVERY 6 HOURS AS NEEDED FOR SHORTNESS OF BREATH OR wheezing   amLODipine (NORVASC) 5 MG tablet Take 1 tablet (5 mg total) by mouth daily.   aspirin EC 81 MG tablet Take 81 mg by mouth daily. Swallow whole.   atorvastatin (LIPITOR) 40 MG tablet Take 1 tablet (40 mg total) by mouth every evening.   docusate sodium (COLACE) 100 MG capsule Take 100 mg by mouth 2 (two) times daily.   gabapentin (NEURONTIN) 600 MG tablet Take 600 mg by mouth 4 (four) times daily.   lactulose (CHRONULAC) 10 GM/15ML solution Take 30 mLs by mouth in the morning, at noon, in the evening, and at bedtime.   LINZESS 290 MCG CAPS capsule Take 290 mcg by mouth daily as needed  (constipation).   lisinopril (ZESTRIL) 20 MG tablet Take 1 tablet (20 mg total) by mouth every evening.   metoprolol tartrate (LOPRESSOR) 25 MG tablet TAKE ONE TABLET BY MOUTH TWICE DAILY   mometasone (ELOCON) 0.1 % cream Apply to area of irritation BID/day for 7 days max. (Patient taking differently: Apply 1 Application topically daily as needed (hand rash).)   mometasone-formoterol (DULERA) 100-5 MCG/ACT AERO Inhale 2 puffs into the lungs daily.   naloxone (NARCAN) 4 MG/0.1ML LIQD nasal spray kit Place 1 spray into the nose as needed (opioid overdose).   omeprazole (PRILOSEC) 40 MG capsule TAKE 1 CAPSULE BY MOUTH TWICE A DAY   ondansetron (ZOFRAN) 8 MG tablet Take 1 tablet (8 mg total) by mouth every 8 (eight) hours as needed for nausea or vomiting.   oxyCODONE (ROXICODONE) 15 MG immediate release tablet Take 15 mg by mouth every 4 (four) hours as needed for pain.   sucralfate (CARAFATE) 1 GM/10ML suspension TAKE 10 MLS (1 G TOTAL) BY MOUTH 4 (FOUR) TIMES DAILY.   Tiotropium Bromide Monohydrate (SPIRIVA RESPIMAT) 2.5 MCG/ACT AERS INHALE 1 PUFF BY MOUTH INTO LUNGS ONCE DAILY     Allergies:   Cyclobenzaprine, Levofloxacin, Chlorpheniramine maleate, Phenergan [promethazine hcl],  Phenylephrine hcl, Toradol [ketorolac tromethamine], Zoloft [sertraline hcl], Cephalexin, Ketorolac, and Tramadol   Social History   Socioeconomic History   Marital status: Widowed    Spouse name: Not on file   Number of children: 0   Years of education: Not on file   Highest education level: 9th grade  Occupational History   Occupation: disabled  Tobacco Use   Smoking status: Every Day    Packs/day: 0.50    Years: 35.00    Additional pack years: 0.00    Total pack years: 17.50    Types: Cigarettes   Smokeless tobacco: Never  Vaping Use   Vaping Use: Former   Quit date: 06/24/2013  Substance and Sexual Activity   Alcohol use: Not Currently    Alcohol/week: 0.0 standard drinks of alcohol   Drug use: Not  Currently   Sexual activity: Yes    Birth control/protection: None  Other Topics Concern   Not on file  Social History Narrative   Moved from [place].   Social Determinants of Health   Financial Resource Strain: Low Risk  (04/15/2022)   Overall Financial Resource Strain (CARDIA)    Difficulty of Paying Living Expenses: Not very hard  Food Insecurity: No Food Insecurity (10/23/2022)   Hunger Vital Sign    Worried About Running Out of Food in the Last Year: Never true    Ran Out of Food in the Last Year: Never true  Transportation Needs: No Transportation Needs (04/15/2022)   PRAPARE - Administrator, Civil Service (Medical): No    Lack of Transportation (Non-Medical): No  Physical Activity: Inactive (09/12/2022)   Exercise Vital Sign    Days of Exercise per Week: 0 days    Minutes of Exercise per Session: 0 min  Stress: Stress Concern Present (05/14/2022)   [employer] of Occupational Health - Occupational Stress Questionnaire    Feeling of Stress : Rather much  Social Connections: Socially Isolated (09/12/2022)   Social Connection and Isolation Panel [NHANES]    Frequency of Communication with Friends and Family: More than three times a week    Frequency of Social Gatherings with Friends and Family: More than three times a week    Attends Religious Services: Never    Database administrator or Organizations: No    Attends Banker Meetings: Never    Marital Status: Widowed     Family History: The patient's family history includes Breast cancer in her maternal aunt; Diabetes in her maternal uncle; Healthy in her brother, brother, and brother; Heart disease in her mother; Lung cancer in her mother; Ovarian cancer in her mother.  ROS:   Please see the history of present illness.     All other systems reviewed and are negative.  EKGs/Labs/Other Studies Reviewed:    The following studies were reviewed today:   EKG:  EKG is  ordered today.  The  ekg ordered today demonstrates normal sinus rhythm, heart rate 67  Recent Labs: 12/12/2021: Magnesium 2.0 04/03/2022: TSH 2.910 10/30/2022: ALT 15; BUN 13; Creatinine, Ser 0.98; Hemoglobin 10.4; Platelets 226; Potassium 4.2; Sodium 134  Recent Lipid Panel    Component Value Date/Time   CHOL 268 (H) 04/03/2022 1443   TRIG 268 (H) 04/03/2022 1443   HDL 37 (L) 04/03/2022 1443   CHOLHDL 7.2 (H) 04/03/2022 1443   CHOLHDL 5.3 08/02/2015 0606   VLDL 24 08/02/2015 0606   LDLCALC 179 (H) 04/03/2022 1443     Risk Assessment/Calculations:  Physical Exam:    VS:  BP 136/72 (BP Location: Left Arm, Patient Position: Sitting, Cuff Size: Normal)   Pulse 66   Ht 5\' 4"  (1.626 m)   Wt 190 lb 12.8 oz (86.5 kg)   LMP 12/16/2017 (Approximate)   SpO2 97%   BMI 32.75 kg/m     Wt Readings from Last 3 Encounters:  11/05/22 190 lb (86.2 kg)  11/01/22 190 lb 12.8 oz (86.5 kg)  10/30/22 190 lb 3.2 oz (86.3 kg)     GEN:  Well nourished, well developed in no acute distress HEENT: Normal NECK: No JVD; No carotid bruits CARDIAC: RRR, no murmurs, rubs, gallops RESPIRATORY: Diminished breath sounds, no wheezing ABDOMEN: Soft, non-tender, non-distended MUSCULOSKELETAL:  No edema; No deformity  SKIN: Warm and dry NEUROLOGIC:  Alert and oriented x 3 PSYCHIATRIC:  Normal affect   ASSESSMENT:    1. Coronary artery disease involving native heart, unspecified vessel or lesion type, unspecified whether angina present   2. Mixed hyperlipidemia   3. Primary hypertension   4. Smoking    PLAN:    In order of problems listed above:  Mild to nonobstructive CAD (LM, LAD, LCx), cont Lipitor 40 mg daily, continue aspirin 81.  Echo with EF 60 to 65%. Hyperlipidemia, continue Lipitor 40 mg as above.  Lipid panel in 3 months. Hypertension, BP controlled, continue lisinopril 20 mg daily, Lopressor, Norvasc. Current smoker, smoking cessation advised.  Follow-up in 6 months.  Addendum Contacted by  the surgical team as patient is needing cardiac risk stratification prior to shoulder surgery.  Patient has nonobstructive CAD, echocardiogram with normal EF.  Okay to proceed with surgical procedure and anesthesia from a cardiac perspective.  Thank you     Medication Adjustments/Labs and Tests Ordered: Current medicines are reviewed at length with the patient today.  Concerns regarding medicines are outlined above.  No orders of the defined types were placed in this encounter.  No orders of the defined types were placed in this encounter.   Patient Instructions  Medication Instructions:   Your physician recommends that you continue on your current medications as directed. Please refer to the Current Medication list given to you today.  *If you need a refill on your cardiac medications before your next appointment, please call your pharmacy*   Lab Work:  1. Your physician recommends that you return for lab work in: 4 months at the medical mall. You will need to be fasting.  No appt is needed. Hours are M-F 7AM- 6 PM.  If you have labs (blood work) drawn today and your tests are completely normal, you will receive your results only by: MyChart Message (if you have MyChart) OR A paper copy in the mail If you have any lab test that is abnormal or we need to change your treatment, we will call you to review the results.   Testing/Procedures:  None Ordered   Follow-Up: At [facility], you and your health needs are our priority.  As part of our continuing mission to provide you with exceptional heart care, we have created designated Provider Care Teams.  These Care Teams include your primary Cardiologist (physician) and Advanced Practice Providers (APPs -  Physician Assistants and Nurse Practitioners) who all work together to provide you with the care you need, when you need it.  We recommend signing up for the patient portal called "MyChart".  Sign up information is provided  on this After Visit Summary.  MyChart is used to connect  with patients for Virtual Visits (Telemedicine).  Patients are able to view lab/test results, encounter notes, upcoming appointments, etc.  Non-urgent messages can be sent to your provider as well.   To learn more about what you can do with MyChart, go to [email or web address].    Your next appointment:   6 month(s)  Provider:   You may see Debbe Odea, MD or one of the following Advanced Practice Providers on your designated Care Team:   Nicolasa Ducking, NP Eula Listen, [name] Cadence Fransico Michael, [name] Charlsie Quest, NP    Signed, Debbe Odea, MD  11/21/2022 2:43 PM    [facility]

## 2022-11-01 NOTE — Anesthesia Preprocedure Evaluation (Addendum)
Anesthesia Evaluation  Patient identified by MRN, date of birth, ID band Patient awake    Reviewed: Allergy & Precautions, NPO status , Patient's Chart, lab work & pertinent test results, reviewed documented beta blocker date and time   Airway Mallampati: II  TM Distance: >3 FB Neck ROM: Full    Dental  (+) Dental Advisory Given, Edentulous Upper, Edentulous Lower   Pulmonary asthma , sleep apnea , COPD,  COPD inhaler, Current Smoker and Patient abstained from smoking.   Pulmonary exam normal breath sounds clear to auscultation       Cardiovascular hypertension, Pt. on medications and Pt. on home beta blockers (-) angina + CAD, + Past MI, + Cardiac Stents and + Peripheral Vascular Disease  Normal cardiovascular exam Rhythm:Regular Rate:Normal     Neuro/Psych  Headaches PSYCHIATRIC DISORDERS Anxiety     TIA Neuromuscular disease CVA    GI/Hepatic ,GERD  Medicated,,(+) Hepatitis -, C  Endo/Other  Obesity   Renal/GU negative Renal ROS     Musculoskeletal  (+) Arthritis ,    Abdominal   Peds  Hematology  (+) Blood dyscrasia, anemia   Anesthesia Other Findings   Reproductive/Obstetrics                             Anesthesia Physical Anesthesia Plan  ASA: 3  Anesthesia Plan: General   Post-op Pain Management: Tylenol PO (pre-op)* and Ketamine IV*   Induction: Intravenous  PONV Risk Score and Plan: 2 and Midazolam, Dexamethasone and Ondansetron  Airway Management Planned: Video Laryngoscope Planned and Oral ETT  Additional Equipment:   Intra-op Plan:   Post-operative Plan: Extubation in OR  Informed Consent: I have reviewed the patients History and Physical, chart, labs and discussed the procedure including the risks, benefits and alternatives for the proposed anesthesia with the patient or authorized representative who has indicated his/her understanding and acceptance.      Dental advisory given  Plan Discussed with: CRNA  Anesthesia Plan Comments: (PAT note by Antionette Poles, [name]: 57 year old female with pertinent history including HTN, current smoker with associated COPD, hepatitis C (treated), GERD, nonobstructive CAD, HLD.  Nuclear stress test 08/2019 was nonischemic, low risk.  Echo 08/2019 showed EF 55 to 60%.  Prior left heart cath in 2017 showed mild nonobstructive disease.  Patient was last seen by cardiologist Dr. Azucena Cecil 07/19/2022 with complaint of chest pain on and off for the prior 6 months.  Echocardiogram and coronary CTA were ordered.  Echo showed EF 60 to 65%, normal RV function, mild MR. Coronary CTA showed elevated coronary calcium score of 1092 with mild nonobstructive CAD. Dr. Azucena Cecil recommended continue current medical therapy.  History of C5-T1 posterior fusion.  Preop labs reviewed, mild hyponatremia sodium 134, mild anemia hemoglobin 10.4, otherwise unremarkable.  EKG 07/19/2022: NSR.  Rate 67.  Low-voltage QRS.  Coronary CTA 10/28/2022: IMPRESSION: 1. Coronary calcium score of 1092. This was 99th percentile for age and sex matched control.  2. Normal coronary origin with left dominance.  3. Mild LM, proximal LAD and LCx stenosis (25-49%).  4. Minimal RCA stenosis (<25%).  5. CAD-RADS 2. Mild non-obstructive CAD (25-49%). Consider non-atherosclerotic causes of chest pain. Consider preventive therapy and risk factor modification.  TTE 10/21/22: 1. Left ventricular ejection fraction, by estimation, is 60 to 65%. The  left ventricle has normal function. The left ventricle has no regional  wall motion abnormalities. There is mild left ventricular hypertrophy.  Left ventricular diastolic  parameters  were normal.  2. Right ventricular systolic function is normal. The right ventricular  size is normal.  3. The mitral valve is normal in structure. Mild mitral valve  regurgitation.  4. The aortic valve was not  well visualized. Aortic valve regurgitation  is not visualized. No aortic stenosis is present.  5. The inferior vena cava is normal in size with greater than 50%  respiratory variability, suggesting right atrial pressure of 3 mmHg.    )        Anesthesia Quick Evaluation

## 2022-11-05 ENCOUNTER — Encounter (HOSPITAL_COMMUNITY)
Admission: RE | Disposition: A | Discharge status: Home / Self Care | Payer: Self-pay | Source: Home / Self Care | Attending: Neurological Surgery

## 2022-11-05 ENCOUNTER — Encounter (HOSPITAL_COMMUNITY): Payer: Self-pay | Admitting: Neurological Surgery

## 2022-11-05 ENCOUNTER — Other Ambulatory Visit: Payer: Self-pay

## 2022-11-05 ENCOUNTER — Ambulatory Visit (HOSPITAL_COMMUNITY): Payer: 59

## 2022-11-05 ENCOUNTER — Ambulatory Visit (HOSPITAL_COMMUNITY)
Admission: RE | Admit: 2022-11-05 | Discharge: 2022-11-05 | Disposition: A | Discharge status: Home / Self Care | Payer: 59 | Attending: Neurological Surgery | Admitting: Neurological Surgery

## 2022-11-05 ENCOUNTER — Ambulatory Visit (HOSPITAL_BASED_OUTPATIENT_CLINIC_OR_DEPARTMENT_OTHER): Payer: 59 | Admitting: Physician Assistant

## 2022-11-05 ENCOUNTER — Ambulatory Visit (HOSPITAL_COMMUNITY): Payer: 59 | Admitting: Physician Assistant

## 2022-11-05 DIAGNOSIS — Z79899 Other long term (current) drug therapy: Secondary | ICD-10-CM | POA: Diagnosis not present

## 2022-11-05 DIAGNOSIS — M1990 Unspecified osteoarthritis, unspecified site: Secondary | ICD-10-CM | POA: Insufficient documentation

## 2022-11-05 DIAGNOSIS — Z955 Presence of coronary angioplasty implant and graft: Secondary | ICD-10-CM | POA: Insufficient documentation

## 2022-11-05 DIAGNOSIS — B1920 Unspecified viral hepatitis C without hepatic coma: Secondary | ICD-10-CM | POA: Insufficient documentation

## 2022-11-05 DIAGNOSIS — F17200 Nicotine dependence, unspecified, uncomplicated: Secondary | ICD-10-CM | POA: Insufficient documentation

## 2022-11-05 DIAGNOSIS — E7800 Pure hypercholesterolemia, unspecified: Secondary | ICD-10-CM | POA: Diagnosis not present

## 2022-11-05 DIAGNOSIS — Z6832 Body mass index (BMI) 32.0-32.9, adult: Secondary | ICD-10-CM | POA: Insufficient documentation

## 2022-11-05 DIAGNOSIS — E669 Obesity, unspecified: Secondary | ICD-10-CM | POA: Diagnosis not present

## 2022-11-05 DIAGNOSIS — T8132XA Disruption of internal operation (surgical) wound, not elsewhere classified, initial encounter: Secondary | ICD-10-CM | POA: Insufficient documentation

## 2022-11-05 DIAGNOSIS — I252 Old myocardial infarction: Secondary | ICD-10-CM | POA: Insufficient documentation

## 2022-11-05 DIAGNOSIS — F419 Anxiety disorder, unspecified: Secondary | ICD-10-CM | POA: Diagnosis not present

## 2022-11-05 DIAGNOSIS — G709 Myoneural disorder, unspecified: Secondary | ICD-10-CM | POA: Insufficient documentation

## 2022-11-05 DIAGNOSIS — I2510 Atherosclerotic heart disease of native coronary artery without angina pectoris: Secondary | ICD-10-CM | POA: Insufficient documentation

## 2022-11-05 DIAGNOSIS — I10 Essential (primary) hypertension: Secondary | ICD-10-CM | POA: Insufficient documentation

## 2022-11-05 DIAGNOSIS — F17210 Nicotine dependence, cigarettes, uncomplicated: Secondary | ICD-10-CM

## 2022-11-05 DIAGNOSIS — Y838 Other surgical procedures as the cause of abnormal reaction of the patient, or of later complication, without mention of misadventure at the time of the procedure: Secondary | ICD-10-CM | POA: Diagnosis not present

## 2022-11-05 DIAGNOSIS — I739 Peripheral vascular disease, unspecified: Secondary | ICD-10-CM | POA: Insufficient documentation

## 2022-11-05 DIAGNOSIS — J449 Chronic obstructive pulmonary disease, unspecified: Secondary | ICD-10-CM | POA: Insufficient documentation

## 2022-11-05 DIAGNOSIS — J45909 Unspecified asthma, uncomplicated: Secondary | ICD-10-CM

## 2022-11-05 DIAGNOSIS — K219 Gastro-esophageal reflux disease without esophagitis: Secondary | ICD-10-CM | POA: Insufficient documentation

## 2022-11-05 HISTORY — PX: LUMBAR WOUND DEBRIDEMENT: SHX1988

## 2022-11-05 SURGERY — LUMBAR WOUND DEBRIDEMENT
Anesthesia: General

## 2022-11-05 MED ORDER — SUGAMMADEX SODIUM 200 MG/2ML IV SOLN
INTRAVENOUS | Status: DC | PRN
  Administered 2022-11-05: 200 mg via INTRAVENOUS

## 2022-11-05 MED ORDER — ROCURONIUM BROMIDE 10 MG/ML (PF) SYRINGE
PREFILLED_SYRINGE | INTRAVENOUS | Status: AC
  Filled 2022-11-05: qty 10

## 2022-11-05 MED ORDER — DEXAMETHASONE SODIUM PHOSPHATE 10 MG/ML IJ SOLN
INTRAMUSCULAR | Status: DC | PRN
  Administered 2022-11-05: 10 mg via INTRAVENOUS

## 2022-11-05 MED ORDER — MIDAZOLAM HCL 2 MG/2ML IJ SOLN
INTRAMUSCULAR | Status: DC | PRN
  Administered 2022-11-05: 2 mg via INTRAVENOUS

## 2022-11-05 MED ORDER — CHLORHEXIDINE GLUCONATE 0.12 % MT SOLN: 15.0000 mL | Freq: Once | OROMUCOSAL | Status: AC

## 2022-11-05 MED ORDER — 0.9 % SODIUM CHLORIDE (POUR BTL) OPTIME
TOPICAL | Status: DC | PRN
  Administered 2022-11-05: 1000 mL

## 2022-11-05 MED ORDER — PROPOFOL 10 MG/ML IV BOLUS
INTRAVENOUS | Status: AC
  Filled 2022-11-05: qty 20

## 2022-11-05 MED ORDER — ONDANSETRON HCL 4 MG/2ML IJ SOLN
INTRAMUSCULAR | Status: DC | PRN
  Administered 2022-11-05: 4 mg via INTRAVENOUS

## 2022-11-05 MED ORDER — MIDAZOLAM HCL 2 MG/2ML IJ SOLN
INTRAMUSCULAR | Status: AC
  Filled 2022-11-05: qty 2

## 2022-11-05 MED ORDER — ONDANSETRON HCL 4 MG/2ML IJ SOLN
INTRAMUSCULAR | Status: AC
  Filled 2022-11-05: qty 2

## 2022-11-05 MED ORDER — ROCURONIUM BROMIDE 10 MG/ML (PF) SYRINGE
PREFILLED_SYRINGE | INTRAVENOUS | Status: DC | PRN
  Administered 2022-11-05: 60 mg via INTRAVENOUS

## 2022-11-05 MED ORDER — THROMBIN 5000 UNITS EX SOLR
OROMUCOSAL | Status: DC | PRN
  Administered 2022-11-05: 5 mL via TOPICAL

## 2022-11-05 MED ORDER — FENTANYL CITRATE (PF) 250 MCG/5ML IJ SOLN
INTRAMUSCULAR | Status: AC
  Filled 2022-11-05: qty 5

## 2022-11-05 MED ORDER — CHLORHEXIDINE GLUCONATE CLOTH 2 % EX PADS: 6.0000 | MEDICATED_PAD | Freq: Once | CUTANEOUS | Status: DC

## 2022-11-05 MED ORDER — VANCOMYCIN HCL IN DEXTROSE 1-5 GM/200ML-% IV SOLN: 1000.0000 mg | INTRAVENOUS | Status: AC

## 2022-11-05 MED ORDER — PROMETHAZINE HCL 25 MG/ML IJ SOLN: 6.2500 mg | INTRAMUSCULAR | Status: DC | PRN

## 2022-11-05 MED ORDER — KETAMINE HCL 50 MG/5ML IJ SOSY
PREFILLED_SYRINGE | INTRAMUSCULAR | Status: AC
  Filled 2022-11-05: qty 5

## 2022-11-05 MED ORDER — FENTANYL CITRATE (PF) 100 MCG/2ML IJ SOLN
INTRAMUSCULAR | Status: AC
  Filled 2022-11-05: qty 2

## 2022-11-05 MED ORDER — LIDOCAINE 2% (20 MG/ML) 5 ML SYRINGE
INTRAMUSCULAR | Status: AC
  Filled 2022-11-05: qty 5

## 2022-11-05 MED ORDER — FENTANYL CITRATE (PF) 100 MCG/2ML IJ SOLN
25.0000 ug | INTRAMUSCULAR | Status: DC | PRN
  Administered 2022-11-05: 25 ug via INTRAVENOUS

## 2022-11-05 MED ORDER — LIDOCAINE-EPINEPHRINE 1 %-1:100000 IJ SOLN
INTRAMUSCULAR | Status: DC | PRN
  Administered 2022-11-05: 8 mL

## 2022-11-05 MED ORDER — LACTATED RINGERS IV SOLN: INTRAVENOUS | Status: DC

## 2022-11-05 MED ORDER — ACETAMINOPHEN 500 MG PO TABS: 1000.0000 mg | ORAL_TABLET | Freq: Once | ORAL | Status: AC

## 2022-11-05 MED ORDER — VANCOMYCIN HCL IN DEXTROSE 1-5 GM/200ML-% IV SOLN
INTRAVENOUS | Status: AC
  Administered 2022-11-05: 1000 mg via INTRAVENOUS
  Filled 2022-11-05: qty 200

## 2022-11-05 MED ORDER — DEXAMETHASONE SODIUM PHOSPHATE 10 MG/ML IJ SOLN
INTRAMUSCULAR | Status: AC
  Filled 2022-11-05: qty 1

## 2022-11-05 MED ORDER — LIDOCAINE 2% (20 MG/ML) 5 ML SYRINGE
INTRAMUSCULAR | Status: DC | PRN
  Administered 2022-11-05: 100 mg via INTRAVENOUS

## 2022-11-05 MED ORDER — ORAL CARE MOUTH RINSE: 15.0000 mL | Freq: Once | OROMUCOSAL | Status: AC

## 2022-11-05 MED ORDER — THROMBIN 5000 UNITS EX SOLR
CUTANEOUS | Status: AC
  Filled 2022-11-05: qty 5000

## 2022-11-05 MED ORDER — FENTANYL CITRATE (PF) 250 MCG/5ML IJ SOLN
INTRAMUSCULAR | Status: DC | PRN
  Administered 2022-11-05 (×2): 50 ug via INTRAVENOUS

## 2022-11-05 MED ORDER — KETAMINE HCL 10 MG/ML IJ SOLN
INTRAMUSCULAR | Status: DC | PRN
  Administered 2022-11-05: 40 mg via INTRAVENOUS

## 2022-11-05 MED ORDER — CHLORHEXIDINE GLUCONATE 0.12 % MT SOLN
OROMUCOSAL | Status: AC
  Administered 2022-11-05: 15 mL via OROMUCOSAL
  Filled 2022-11-05: qty 15

## 2022-11-05 MED ORDER — PROPOFOL 10 MG/ML IV BOLUS
INTRAVENOUS | Status: DC | PRN
  Administered 2022-11-05: 150 mg via INTRAVENOUS

## 2022-11-05 MED ORDER — LIDOCAINE-EPINEPHRINE 1 %-1:100000 IJ SOLN
INTRAMUSCULAR | Status: AC
  Filled 2022-11-05: qty 1

## 2022-11-05 MED ORDER — ACETAMINOPHEN 500 MG PO TABS
ORAL_TABLET | ORAL | Status: AC
  Administered 2022-11-05: 1000 mg via ORAL
  Filled 2022-11-05: qty 2

## 2022-11-05 SURGICAL SUPPLY — 47 items
ADH SKN CLS APL DERMABOND .7 (GAUZE/BANDAGES/DRESSINGS) ×1
BAG COUNTER SPONGE SURGICOUNT (BAG) ×1 IMPLANT
BAG SPNG CNTER NS LX DISP (BAG) ×2
BLADE CLIPPER SURG (BLADE) IMPLANT
BLADE SURG 11 STRL SS (BLADE) ×1 IMPLANT
CANISTER SUCT 3000ML PPV (MISCELLANEOUS) ×1 IMPLANT
DERMABOND ADVANCED .7 DNX12 (GAUZE/BANDAGES/DRESSINGS) IMPLANT
DRAPE LAPAROTOMY 100X72X124 (DRAPES) ×1 IMPLANT
DRAPE MICROSCOPE SLANT 54X150 (MISCELLANEOUS) IMPLANT
DRAPE SURG 17X23 STRL (DRAPES) ×1 IMPLANT
DURAPREP 26ML APPLICATOR (WOUND CARE) ×1 IMPLANT
ELECT REM PT RETURN 9FT ADLT (ELECTROSURGICAL) ×1
ELECTRODE REM PT RTRN 9FT ADLT (ELECTROSURGICAL) ×1 IMPLANT
GAUZE 4X4 16PLY ~~LOC~~+RFID DBL (SPONGE) IMPLANT
GAUZE SPONGE 4X4 12PLY STRL (GAUZE/BANDAGES/DRESSINGS) IMPLANT
GLOVE BIO SURGEON STRL SZ7.5 (GLOVE) ×1 IMPLANT
GLOVE BIOGEL PI IND STRL 7.0 (GLOVE) ×1 IMPLANT
GLOVE BIOGEL PI IND STRL 7.5 (GLOVE) ×1 IMPLANT
GLOVE ECLIPSE 7.5 STRL STRAW (GLOVE) ×1 IMPLANT
GLOVE EXAM NITRILE LRG STRL (GLOVE) IMPLANT
GLOVE EXAM NITRILE XL STR (GLOVE) IMPLANT
GLOVE EXAM NITRILE XS STR PU (GLOVE) IMPLANT
GOWN STRL REUS W/ TWL LRG LVL3 (GOWN DISPOSABLE) ×2 IMPLANT
GOWN STRL REUS W/ TWL XL LVL3 (GOWN DISPOSABLE) IMPLANT
GOWN STRL REUS W/TWL 2XL LVL3 (GOWN DISPOSABLE) IMPLANT
GOWN STRL REUS W/TWL LRG LVL3 (GOWN DISPOSABLE) ×3
GOWN STRL REUS W/TWL XL LVL3 (GOWN DISPOSABLE)
HEMOSTAT POWDER KIT SURGIFOAM (HEMOSTASIS) ×1 IMPLANT
KIT BASIN OR (CUSTOM PROCEDURE TRAY) ×1 IMPLANT
KIT TURNOVER KIT B (KITS) ×1 IMPLANT
NEEDLE HYPO 22GX1.5 SAFETY (NEEDLE) IMPLANT
NS IRRIG 1000ML POUR BTL (IV SOLUTION) ×1 IMPLANT
PACK LAMINECTOMY NEURO (CUSTOM PROCEDURE TRAY) ×1 IMPLANT
PAD ARMBOARD 7.5X6 YLW CONV (MISCELLANEOUS) ×3 IMPLANT
SOL ELECTROSURG ANTI STICK (MISCELLANEOUS)
SOLUTION ELECTROSURG ANTI STCK (MISCELLANEOUS) ×1 IMPLANT
SPONGE T-LAP 4X18 ~~LOC~~+RFID (SPONGE) IMPLANT
SUT ETHILON 3 0 FSL (SUTURE) ×1 IMPLANT
SUT MNCRL AB 3-0 PS2 18 (SUTURE) ×1 IMPLANT
SUT VIC AB 0 CT1 18XCR BRD8 (SUTURE) ×1 IMPLANT
SUT VIC AB 0 CT1 8-18 (SUTURE) ×1
SUT VIC AB 2-0 CP2 18 (SUTURE) ×1 IMPLANT
SWAB COLLECTION DEVICE MRSA (MISCELLANEOUS) IMPLANT
SWAB CULTURE ESWAB REG 1ML (MISCELLANEOUS) IMPLANT
TOWEL GREEN STERILE (TOWEL DISPOSABLE) ×1 IMPLANT
TOWEL GREEN STERILE FF (TOWEL DISPOSABLE) ×1 IMPLANT
WATER STERILE IRR 1000ML POUR (IV SOLUTION) ×1 IMPLANT

## 2022-11-05 NOTE — Transfer of Care (Signed)
Immediate Anesthesia Transfer of Care Note  Patient: Sara Munoz  Procedure(s) Performed: Thoracic one spinous process removal and wound revision  Patient Location: PACU  Anesthesia Type:General  Level of Consciousness: drowsy and patient cooperative  Airway & Oxygen Therapy: Patient Spontanous Breathing and Patient connected to face mask oxygen  Post-op Assessment: Report given to RN and Post -op Vital signs reviewed and stable  Post vital signs: Reviewed and stable  Last Vitals:  Vitals Value Taken Time  BP 144/71 11/05/22 0905  Temp    Pulse 97 11/05/22 0907  Resp 17 11/05/22 0907  SpO2 100 % 11/05/22 0907  Vitals shown include unvalidated device data.  Last Pain:  Vitals:   11/05/22 0617  PainSc: 6       Patients Stated Pain Goal: 3 (11/05/22 0617)  Complications: There were no known notable events for this encounter.

## 2022-11-05 NOTE — Op Note (Signed)
PATIENT: Sara Munoz  DAY OF SURGERY: 11/05/22   PRE-OPERATIVE DIAGNOSIS:  Posterior cervicothoracic fascial dehiscence   POST-OPERATIVE DIAGNOSIS:  Same   PROCEDURE:  Posterior cervicothoracic wound and fascial revision, T1 spinous process resection   SURGEON:  Surgeon(s) and Role:    Jadene Pierini, MD    Emilee Hero PA   ANESTHESIA: ETGA   BRIEF HISTORY: This is a 57 year old woman with a prior posterior cervical that presented with continued tenderness over her spinous process where she had a large paraspinal fascial dehiscence. I therefore recommended repair of the fascial dehiscence and resection of a prominent T1 spinous process. This was discussed with the patient as well as risks, benefits, and alternatives and wished to proceed with surgery.   OPERATIVE DETAIL: The patient was taken to the operating room, anesthesia was induced by the anesthesia team, and the patient was placed on the OR table in the prone position. A formal time out was performed with two patient identifiers and confirmed the operative site. The operative site was marked, hair was clipped with surgical clippers, the area was then prepped and draped in a sterile fashion.   The patient's prior midline posterior cervicothoracic incision was opened caudually over the T1 spinous process. The spinous process was dissected in a subperiosteal fashion and resected to allow for fascial closure. It was quite sharp and the fascia had thinned over it but with only a focal dehiscence without a long-segment / large dehiscence over the entirety of the prior incision. The fascia was then undermined bilaterally until it could be brought together in a tension free closure.   [name] PA was scrubbed and assisted with the entire procedure which included exposure, spinous process resection, and multi-layer closure.  The wound was copiously irrigated, all instrument and sponge counts were correct, and the incision  was then closed in layers. The patient was then returned to anesthesia for emergence. No apparent complications at the completion of the procedure.   EBL:  10mL   DRAINS: none   SPECIMENS: none   Jadene Pierini, MD

## 2022-11-05 NOTE — Anesthesia Postprocedure Evaluation (Signed)
Anesthesia Post Note  Patient: Sara Munoz  Procedure(s) Performed: Thoracic one spinous process removal and wound revision     Patient location during evaluation: PACU Anesthesia Type: General Level of consciousness: awake and alert Pain management: pain level controlled Vital Signs Assessment: post-procedure vital signs reviewed and stable Respiratory status: spontaneous breathing, nonlabored ventilation, respiratory function stable and patient connected to nasal cannula oxygen Cardiovascular status: blood pressure returned to baseline and stable Postop Assessment: no apparent nausea or vomiting Anesthetic complications: no   There were no known notable events for this encounter.  Last Vitals:  Vitals:   11/05/22 0930 11/05/22 0945  BP: (!) 153/87 (!) 140/82  Pulse: 89 88  Resp: 16 15  Temp:  36.6 C  SpO2: 99% 96%    Last Pain:  Vitals:   11/05/22 0945  PainSc: 3                  Collene Schlichter

## 2022-11-05 NOTE — Anesthesia Procedure Notes (Signed)
Procedure Name: Intubation Date/Time: 11/05/2022 8:01 AM  Performed by: Carlos American, CRNAPre-anesthesia Checklist: Patient identified, Emergency Drugs available, Suction available and Patient being monitored Patient Re-evaluated:Patient Re-evaluated prior to induction Oxygen Delivery Method: Circle system utilized Preoxygenation: Pre-oxygenation with 100% oxygen Induction Type: IV induction Ventilation: Mask ventilation without difficulty Laryngoscope Size: Glidescope Grade View: Grade I Tube type: Oral Tube size: 7.0 mm Number of attempts: 1 Airway Equipment and Method: Video-laryngoscopy and Rigid stylet Placement Confirmation: ETT inserted through vocal cords under direct vision, positive ETCO2 and breath sounds checked- equal and bilateral Secured at: 21 cm Tube secured with: Tape Dental Injury: Teeth and Oropharynx as per pre-operative assessment  Comments: Placed by Charlann Noss, SRNA; CRNA and MDA at bedside.

## 2022-11-05 NOTE — H&P (Signed)
Surgical H&P Update  HPI: 57 y.o. with a history of prior posterior cervical with fascial dehiscence and tenderness over her spinous process. No changes in health since they were last seen. Still having the above and wishes to proceed with surgery.  PMHx:  Past Medical History:  Diagnosis Date   Anemia    Anxiety    Asthma    Atypical chest pain 08/02/2015   CAD (coronary artery disease)    Chronic back pain    COPD (chronic obstructive pulmonary disease) (HCC)    Coronary artery disease    a. Mild-non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/2014   GERD (gastroesophageal reflux disease)    Headache    occasional migraines   Hepatitis C    per patient, "took medicines for it and no longer has it"   Hypercholesteremia    Hypertension    Hypokalemia    Hypomagnesemia 01/04/2014   MI (myocardial infarction) [facility])    Nerve pain    per patient, in the lower back   Opiate use 02/27/2016   Osteoarthritis    Pneumonia    Stroke [facility]    FamHx:  Family History  Problem Relation Age of Onset   Heart disease Mother    Lung cancer Mother    Ovarian cancer Mother    Healthy Brother    Healthy Brother    Healthy Brother    Diabetes Maternal Uncle    Breast cancer Maternal Aunt    SocHx:  reports that she has been smoking cigarettes. She has a 17.50 pack-year smoking history. She has never used smokeless tobacco. She reports that she does not currently use alcohol. She reports that she does not currently use drugs.  Physical Exam: Strength 5/5 x4 and SILTx4 except greater occipital numbness on the left, midline cervical scar with fascial dehiscence and prominent / tender spinous processes   Assesment/Plan: 57 y.o. woman with post-op posterior cervicothoracic fascial dehiscence and prominent spinous process, here for spinous process resection and fascial repair. Risks, benefits, and alternatives discussed and the patient would like to continue with surgery.  -OR today -home  post-op  Jadene Pierini, MD 11/05/22 7:38 AM

## 2022-11-06 ENCOUNTER — Encounter (HOSPITAL_COMMUNITY): Payer: Self-pay | Admitting: Neurological Surgery

## 2022-11-12 ENCOUNTER — Telehealth: Payer: Self-pay | Admitting: Cardiology

## 2022-11-12 NOTE — Telephone Encounter (Signed)
Fax requesting provider's office with the updates for the clearance.

## 2022-11-12 NOTE — Telephone Encounter (Signed)
Tiffany from [facility] is calling in regards to clearance for the patient. The clearance was originally requested on 09/16/22, unfortunately the pre op clearance was denied. Patient saw Dr. Myriam Forehand - Sandie Ano on 11/01/22 and stated to Tiffany that she did receive clearance on that visit. Elmarie Shiley is requesting for [place] to fax the clearance to [phone]. Tiffany did stated if we had any questions we can call her directly at [phone]. Please advise.

## 2022-11-22 ENCOUNTER — Telehealth: Payer: Self-pay | Admitting: Family Medicine

## 2022-11-22 NOTE — Telephone Encounter (Signed)
Contacted [name] to schedule their annual wellness visit. Welcome to Medicare visit Due by 08/23/2023.  Thank you,  [facility] Support [facility] Direct dial  [phone]

## 2022-11-25 ENCOUNTER — Other Ambulatory Visit: Payer: Self-pay | Admitting: Surgery

## 2022-11-28 ENCOUNTER — Encounter
Admission: RE | Admit: 2022-11-28 | Discharge: 2022-11-28 | Disposition: A | Discharge status: Home / Self Care | Payer: 59 | Source: Ambulatory Visit | Attending: Surgery | Admitting: Surgery

## 2022-11-28 HISTORY — DX: Hyperkalemia: E87.5

## 2022-11-28 HISTORY — DX: Nausea: R11.0

## 2022-11-28 HISTORY — DX: Nicotine dependence, unspecified, uncomplicated: F17.200

## 2022-11-28 NOTE — Patient Instructions (Addendum)
Your procedure is scheduled on:12-03-22 Tuesday Report to the Registration Desk on the 1st floor of the Medical Mall.Then proceed to the 2nd floor Surgery Desk To find out your arrival time, please call [phone] between 1PM - 3PM on:12-02-22 Monday If your arrival time is 6:00 am, do not arrive before that time as the Medical Mall entrance doors do not open until 6:00 am.  REMEMBER: Instructions that are not followed completely may result in serious medical risk, up to and including death; or upon the discretion of your surgeon and anesthesiologist your surgery may need to be rescheduled.  Do not eat food after midnight the night before surgery.  No gum chewing or hard candies.  You may however, drink CLEAR liquids up to 2 hours before you are scheduled to arrive for your surgery. Do not drink anything within 2 hours of your scheduled arrival time.  Clear liquids include: - water  - apple juice without pulp - gatorade (not RED colors) - black coffee or tea (Do NOT add milk or creamers to the coffee or tea) Do NOT drink anything that is not on this list.  In addition, your doctor has ordered for you to drink the provided:  Ensure Pre-Surgery Clear Carbohydrate Drink  Drinking this carbohydrate drink up to two hours before surgery helps to reduce insulin resistance and improve patient outcomes. Please complete drinking 2 hours before scheduled arrival time.  One week prior to surgery:Last dose 11-28-22 Stop Anti-inflammatories (NSAIDS) such as Advil, Aleve, Ibuprofen, Motrin, Naproxen, Naprosyn and Aspirin based products such as Excedrin, Goody's Powder, BC Powder.You may however, take Tylenol/Oxycodone if needed for pain up until the day of surgery. Stop ANY OVER THE COUNTER supplements/vitamins NOW (11-28-22) until after surgery (Elderberry, Omega 3, Turmeric and Hair skin and nails)   Continue taking all prescribed medications with the exception of the following: -Stop 81 mg Aspirin 5  days prior as instructed by Dr Nolene Bernheim dose was on 11-27-22 Tuesday  TAKE ONLY THESE MEDICATIONS THE MORNING OF SURGERY WITH A SIP OF WATER: -gabapentin (NEURONTIN)  -metoprolol tartrate (LOPRESSOR)  -omeprazole (PRILOSEC)   Use your Tiotropium Bromide Monohydrate (SPIRIVA RESPIMAT),  mometasone-formoterol (DULERA) and albuterol (VENTOLIN HFA) Inhaler the day of surgery and bring your Albuterol Inhaler to the hospital  No Alcohol for 24 hours before or after surgery.  No Smoking including e-cigarettes for 24 hours before surgery.  No chewable tobacco products for at least 6 hours before surgery.  No nicotine patches on the day of surgery.  Do not use any "recreational" drugs for at least a week (preferably 2 weeks) before your surgery.  Please be advised that the combination of cocaine and anesthesia may have negative outcomes, up to and including death. If you test positive for cocaine, your surgery will be cancelled.  On the morning of surgery brush your teeth with toothpaste and water, you may rinse your mouth with mouthwash if you wish. Do not swallow any toothpaste or mouthwash.  Use CHG Soap as directed on instruction sheet.  Do not wear jewelry, make-up, hairpins, clips or nail polish.  Do not wear lotions, powders, or perfumes.   Do not shave body hair from the neck down 48 hours before surgery.  Contact lenses, hearing aids and dentures may not be worn into surgery.  Do not bring valuables to the hospital. [facility] is not responsible for any missing/lost belongings or valuables.    Notify your doctor if there is any change in your medical condition (  cold, fever, infection).  Wear comfortable clothing (specific to your surgery type) to the hospital.  After surgery, you can help prevent lung complications by doing breathing exercises.  Take deep breaths and cough every 1-2 hours. Your doctor may order a device called an Incentive Spirometer to help you take deep  breaths. When coughing or sneezing, hold a pillow firmly against your incision with both hands. This is called "splinting." Doing this helps protect your incision. It also decreases belly discomfort.  If you are being admitted to the hospital overnight, leave your suitcase in the car. After surgery it may be brought to your room.  In case of increased patient census, it may be necessary for you, the patient, to continue your postoperative care in the Same Day Surgery department.  If you are being discharged the day of surgery, you will not be allowed to drive home. You will need a responsible individual to drive you home and stay with you for 24 hours after surgery.   If you are taking public transportation, you will need to have a responsible individual with you.  Please call the Pre-admissions Testing Dept. at [phone] if you have any questions about these instructions.  Surgery Visitation Policy:  Patients having surgery or a procedure may have two visitors.  Children under the age of 36 must have an adult with them who is not the patient.     Preparing for Surgery with CHLORHEXIDINE GLUCONATE (CHG) Soap  Chlorhexidine Gluconate (CHG) Soap  o An antiseptic cleaner that kills germs and bonds with the skin to continue killing germs even after washing  o Used for showering the night before surgery and morning of surgery  Before surgery, you can play an important role by reducing the number of germs on your skin.  CHG (Chlorhexidine gluconate) soap is an antiseptic cleanser which kills germs and bonds with the skin to continue killing germs even after washing.  Please do not use if you have an allergy to CHG or antibacterial soaps. If your skin becomes reddened/irritated stop using the CHG.  1. Shower the NIGHT BEFORE SURGERY and the MORNING OF SURGERY with CHG soap.  2. If you choose to wash your hair, wash your hair first as usual with your normal shampoo.  3. After  shampooing, rinse your hair and body thoroughly to remove the shampoo.  4. Use CHG as you would any other liquid soap. You can apply CHG directly to the skin and wash gently with a scrungie or a clean washcloth.  5. Apply the CHG soap to your body only from the neck down. Do not use on open wounds or open sores. Avoid contact with your eyes, ears, mouth, and genitals (private parts). Wash face and genitals (private parts) with your normal soap.  6. Wash thoroughly, paying special attention to the area where your surgery will be performed.  7. Thoroughly rinse your body with warm water.  8. Do not shower/wash with your normal soap after using and rinsing off the CHG soap.  9. Pat yourself dry with a clean towel.  10. Wear clean pajamas to bed the night before surgery.  12. Place clean sheets on your bed the night of your first shower and do not sleep with pets.  13. Shower again with the CHG soap on the day of surgery prior to arriving at the hospital.  14. Do not apply any deodorants/lotions/powders.  15. Please wear clean clothes to the hospital.  How to Use an  Incentive Spirometer An incentive spirometer is a tool that measures how well you are filling your lungs with each breath. Learning to take long, deep breaths using this tool can help you keep your lungs clear and active. This may help to reverse or lessen your chance of developing breathing (pulmonary) problems, especially infection. You may be asked to use a spirometer: After a surgery. If you have a lung problem or a history of smoking. After a long period of time when you have been unable to move or be active. If the spirometer includes an indicator to show the highest number that you have reached, your health care provider or respiratory therapist will help you set a goal. Keep a log of your progress as told by your health care provider. What are the risks? Breathing too quickly may cause dizziness or cause you to pass  out. Take your time so you do not get dizzy or light-headed. If you are in pain, you may need to take pain medicine before doing incentive spirometry. It is harder to take a deep breath if you are having pain. How to use your incentive spirometer  Sit up on the edge of your bed or on a chair. Hold the incentive spirometer so that it is in an upright position. Before you use the spirometer, breathe out normally. Place the mouthpiece in your mouth. Make sure your lips are closed tightly around it. Breathe in slowly and as deeply as you can through your mouth, causing the piston or the ball to rise toward the top of the chamber. Hold your breath for 3-5 seconds, or for as long as possible. If the spirometer includes a coach indicator, use this to guide you in breathing. Slow down your breathing if the indicator goes above the marked areas. Remove the mouthpiece from your mouth and breathe out normally. The piston or ball will return to the bottom of the chamber. Rest for a few seconds, then repeat the steps 10 or more times. Take your time and take a few normal breaths between deep breaths so that you do not get dizzy or light-headed. Do this every 1-2 hours when you are awake. If the spirometer includes a goal marker to show the highest number you have reached (best effort), use this as a goal to work toward during each repetition. After each set of 10 deep breaths, cough a few times. This will help to make sure that your lungs are clear. If you have an incision on your chest or abdomen from surgery, place a pillow or a rolled-up towel firmly against the incision when you cough. This can help to reduce pain while taking deep breaths and coughing. General tips When you are able to get out of bed: Walk around often. Continue to take deep breaths and cough in order to clear your lungs. Keep using the incentive spirometer until your health care provider says it is okay to stop using it. If you have  been in the hospital, you may be told to keep using the spirometer at home. Contact a health care provider if: You are having difficulty using the spirometer. You have trouble using the spirometer as often as instructed. Your pain medicine is not giving enough relief for you to use the spirometer as told. You have a fever. Get help right away if: You develop shortness of breath. You develop a cough with bloody mucus from the lungs. You have fluid or blood coming from an incision site after you  cough. Summary An incentive spirometer is a tool that can help you learn to take long, deep breaths to keep your lungs clear and active. You may be asked to use a spirometer after a surgery, if you have a lung problem or a history of smoking, or if you have been inactive for a long period of time. Use your incentive spirometer as instructed every 1-2 hours while you are awake. If you have an incision on your chest or abdomen, place a pillow or a rolled-up towel firmly against your incision when you cough. This will help to reduce pain. Get help right away if you have shortness of breath, you cough up bloody mucus, or blood comes from your incision when you cough. This information is not intended to replace advice given to you by your health care provider. Make sure you discuss any questions you have with your health care provider. Document Revised: 08/30/2019 Document Reviewed: 08/30/2019 Elsevier Patient Education  2024 [employer].

## 2022-11-29 ENCOUNTER — Encounter: Payer: Self-pay | Admitting: Surgery

## 2022-11-29 NOTE — Progress Notes (Signed)
Perioperative / Anesthesia Services  Pre-Admission Testing Clinical Review / Preoperative Anesthesia Consult  Date: 11/29/22  Patient Demographics:  Name: Sara Munoz DOB:   [birth year] MRN:   [id number]  Planned Surgical Procedure(s):    Case: [id number] Date/Time: 12/03/22 1030   Procedure: SHOULDER ARTHROSCOPY WITH DEBRIDEMENT, DECOMPRESSION, AND POSSIBLE REPAIR OF RECURRENT ROTATOR CUFF TEAR. (Left: Shoulder)   Anesthesia type: Choice   Pre-op diagnosis:      Degenerative tear of glenoid labrum of left shoulder M24.112     Nontraumatic incomplete tear of left rotator cuff M75.112     Tendinitis of upper biceps tendon of left shoulder M75.22     Rotator cuff tendinitis, left M75.82   Location: [facility] OR ROOM 03 / [facility]   Surgeons: Christena Flake, Sara Munoz     NOTE: Available PAT nursing documentation and vital signs have been reviewed. Clinical nursing staff has updated patient's PMH/PSHx, current medication list, and drug allergies/intolerances to ensure comprehensive history available to assist in medical decision making as it pertains to the aforementioned surgical procedure and anticipated anesthetic course. Extensive review of available clinical information personally performed. [place] PMH and PSHx updated with any diagnoses/procedures that  may have been inadvertently omitted during her intake with the pre-admission testing department's nursing staff.  Clinical Discussion:  Sara Munoz is a 57 y.o. female who is submitted for pre-surgical anesthesia review and clearance prior to her undergoing the above procedure. Patient is a Current Smoker (17.5 pack years). Pertinent PMH includes: CAD, ? MI, carotid artery disease, aortic atherosclerosis, CVA/TIA, atypical angina, HTN, HLD, chronic nausea, GERD (on daily PPI + Carafate), anemia, cervical DDD (s/p ACDF C5-C7, PSIF C5-T1), lumbar DDD (s/p PLIF L5-S1), tendinitis LEFT rotator cuff, incomplete tear of LEFT  rotator cuff, chronic lower back pain (on chronic opioid therapy), anxiety, depression, PTSD.  Patient is followed by cardiology Azucena Cecil, Sara Munoz). She was last seen in the cardiology clinic on 11/01/2022; notes reviewed. At the time of her clinic visit, patient doing well overall from a cardiovascular perspective. Patient denied any chest pain, shortness of breath, PND, orthopnea, palpitations, significant peripheral edema, weakness, fatigue, vertiginous symptoms, or presyncope/syncope. Patient with a past medical history significant for cardiovascular diagnoses. Documented physical exam was grossly benign, providing no evidence of acute exacerbation and/or decompensation of the patient's known cardiovascular conditions.  Of note, complete records regarding patient's cardiovascular history unavailable for review at time of consult.  Information gathered from patient report and from notes provided by her local cardiologist.  Patient reported to have suffered an MI in the past.  Details surrounding cardiovascular event unavailable for review.  Patient has undergone multiple diagnostic LEFT heart catheterizations in the past. Patient unable to advise as to whether or not she has undergone PCI in the past.  Notes from various cardiology providers indicate the patient has at least 1 cardiac stent.  Diagnostic LEFT heart catheterization performed in 2011 revealed no significant obstructive CAD  Diagnostic LEFT heart catheterization performed on 07/07/2014 revealed multivessel CAD; 10-20% mid LM, 25% LAD, 30-40% LCx and 20-30% RCA.  Intervention was deferred opting for medical management.  Diagnostic LEFT heart catheterization was performed on 02/22/2016 revealing multivessel CAD; 25% LM and 25% proximal LCx.  Again, given the nonobstructive nature of the noted coronary artery disease, intervention was deferred opting for medical management.  Most recent myocardial perfusion imaging study was performed on  09/01/2019 revealing a normal left ventricular systolic function with an EF of 55-65%.  There was no evidence of stress-induced myocardial ischemia or arrhythmia; no scintigraphic evidence of scar.  Study determined to be normal and low risk.  Most recent TTE was performed on 10/21/2022 revealing a normal left ventricular systolic function with an EF of 60 to 65%.  There were no regional wall motion abnormalities.  There was mild LVH.  Right ventricular size and function normal.  There was mild mitral valve regurgitation.  All transvalvular gradients were noted to be normal providing no evidence suggestive of valvular stenosis.  Aorta normal in size with no evidence of aneurysmal dilatation.  Coronary CTA was performed on 10/28/2022 revealing an elevated coronary calcium score of 1092.  This was in the 99th percentile for age, sex, and race matched control.  Patient with mild (25-49%) stenosis in the mid LM, proximal LAD, and proximal LCx territories.  Additionally there was minimal (less than 25%) stenosis in the RCA territory.  Preventative therapy and risk factor modification was recommended.  Blood pressure reasonably controlled at 136/72 mmHg on currently prescribed CCB (amlodipine), ACEi (lisinopril) and beta-blocker entheses metoprolol tartrate) therapies.  Patient is on atorvastatin for her HLD diagnosis and ASCVD + omega-3 fatty acid prevention. Patient is not diabetic/T2DM + omega-3 fatty acid.  Patient is not diabetic.  Patient does not have an OSAH diagnosis. Functional capacity, as defined by DASI, is documented as being >/= 4 METS. No changes were made to her medication regimen during her visit with cardiology.  Patient scheduled to follow-up with outpatient cardiology in 6 months or sooner if needed.  Sara Munoz is scheduled for an elective SHOULDER ARTHROSCOPY WITH DEBRIDEMENT, DECOMPRESSION, AND POSSIBLE REPAIR OF RECURRENT ROTATOR CUFF TEAR. (Left: Shoulder) on 12/03/2022 with Dr. Leron Croak, Sara Munoz. Given patient's past medical history significant for cardiovascular diagnoses, presurgical cardiac clearance was sought by the PAT team. Per cardiology, "contacted by the surgical team as patient is needing cardiac risk stratification prior to shoulder surgery.  Patient has nonobstructive CAD, echocardiogram with normal EF.  Okay to proceed with surgical procedure and anesthesia from a cardiac perspective".  In review of her medication reconciliation, it is noted that patient is currently on prescribed daily antithrombotic therapy. She has been instructed on recommendations for holding her daily low-dose ASA for 5 days prior to her procedure with plans to restart as soon as postoperative bleeding risk felt to be minimized by her attending surgeon. The patient has been instructed that her last dose of her ASA should be on 11/27/2022.  Patient denies previous perioperative complications with anesthesia in the past. In review of the available records, it is noted that patient underwent a general anesthetic course at [facility] (ASA III) in 10/2022 without documented complications.      11/28/2022   10:00 AM 11/05/2022    9:45 AM 11/05/2022    9:30 AM  Vitals with BMI  Height 5\' 4"     Weight 190 lbs 1 oz    BMI 32.6    Systolic  140 153  Diastolic  82 87  Pulse  88 89    Providers/Specialists:   NOTE: Primary physician provider listed below. Patient may have been seen by APP or partner within same practice.   PROVIDER ROLE / SPECIALTY LAST OV  Poggi, Excell Seltzer, Sara Munoz Orthopedics (Surgeon) 11/28/2022  Ronnald Ramp, Sara Munoz Primary Care Provider 09/12/2022  Debbe Odea, Sara Munoz Cardiology 11/01/2022   Allergies:  Cyclobenzaprine, Levofloxacin, Chlorpheniramine maleate, Phenergan [promethazine hcl], Phenylephrine hcl, Toradol [ketorolac tromethamine], Zoloft [sertraline hcl], Cephalexin,  Ketorolac, and Tramadol  Current Home Medications:   No current  facility-administered medications for this encounter.    albuterol (PROVENTIL) (2.5 MG/3ML) 0.083% nebulizer solution   albuterol (VENTOLIN HFA) 108 (90 Base) MCG/ACT inhaler   amLODipine (NORVASC) 5 MG tablet   aspirin EC 81 MG tablet   atorvastatin (LIPITOR) 40 MG tablet   docusate sodium (COLACE) 100 MG capsule   ELDERBERRY PO   gabapentin (NEURONTIN) 600 MG tablet   lactulose (CHRONULAC) 10 GM/15ML solution   LINZESS 290 MCG CAPS capsule   lisinopril (ZESTRIL) 20 MG tablet   metoprolol tartrate (LOPRESSOR) 25 MG tablet   mometasone (ELOCON) 0.1 % cream   mometasone-formoterol (DULERA) 100-5 MCG/ACT AERO   Multiple Vitamins-Minerals (HAIR SKIN & NAILS PO)   naloxone (NARCAN) 4 MG/0.1ML LIQD nasal spray kit   Omega-3 Fatty Acids (OMEGA 3 PO)   omeprazole (PRILOSEC) 40 MG capsule   ondansetron (ZOFRAN) 8 MG tablet   oxyCODONE (ROXICODONE) 15 MG immediate release tablet   sucralfate (CARAFATE) 1 GM/10ML suspension   Tiotropium Bromide Monohydrate (SPIRIVA RESPIMAT) 2.5 MCG/ACT AERS   TURMERIC PO   History:   Past Medical History:  Diagnosis Date   Anemia    Anxiety    Aortic atherosclerosis (HCC)    Asthma    Atypical chest pain 08/02/2015   Carotid artery disease (HCC)    Chronic back pain    Chronic nausea    Chronic, continuous use of opioids 02/27/2016   a.) has naloxone Rx available   COPD (chronic obstructive pulmonary disease) (HCC)    Coronary artery disease    a.) LHC 2011: no sig CAD (report unavailable); b.) LHC 07/07/2014: 10-20% mLM, 25% LAD, 30-40% LCx, 20-30% RCA -- med mgmt; c.) LHC 02/22/2016: 25% LM, 25% pLCx --> med mgmt; d.) MPI 07/10/2017: no ischemia; e.) MPI 09/01/2019: no ischemia; f.) cCTA 10/28/2022: Ca score 1092 (99th percentile for age/sex/race match control)   Current smoker    DDD (degenerative disc disease), cervical    a.) s/p ACDF C5-C7 and PSIF C5-T1   DDD (degenerative disc disease), lumbar 05/23/2020   a.) s/p L5-S1 PLIF   GERD  (gastroesophageal reflux disease)    Hepatic steatosis    Hepatitis C virus infection cured after antiviral drug therapy    a.) s/p Tx with standard glecaprevir-pibrentasvir course   Hypercholesteremia    Hyperkalemia    Hypertension    Hypokalemia    Hypomagnesemia 01/04/2014   Incomplete tear of left rotator cuff    MI (myocardial infarction) (HCC)    Migraines    MSSA (methicillin susceptible Staphylococcus aureus) septicemia (HCC) 03/03/2014   a.) TEE (-) for valvular vegitation   Nerve pain    per patient, in the lower back   Osteoarthritis    Pneumonia    PTSD (post-traumatic stress disorder)    Tendinitis of left rotator cuff    TIA (transient ischemic attack)    Past Surgical History:  Procedure Laterality Date   ANTERIOR CERVICAL DECOMP/DISCECTOMY FUSION N/A 03/30/2018   Procedure: Cervical six-seven Anterior cervical decompression/discectomy/fusion;  Surgeon: Jadene Pierini, Sara Munoz;  Location: [facility] OR;  Service: Neurosurgery;  Laterality: N/A;   ANTERIOR CERVICAL DECOMP/DISCECTOMY FUSION N/A 01/11/2019   Procedure: Cervical Five-Six Anterior cervical discectomy fusion,  Cervical Five to Cervical Seven anterior instrumented fusion;  Surgeon: Jadene Pierini, Sara Munoz;  Location: [facility] OR;  Service: Neurosurgery;  Laterality: N/A;  Cervical Five-Six Anterior cervical discectomy fusion,  Cervical Five to Cervical Seven anterior instrumented fusion  CARDIAC CATHETERIZATION Left 02/22/2016   Procedure: Left Heart Cath and Coronary Angiography;  Surgeon: Alwyn Pea, Sara Munoz;  Location: [facility] INVASIVE CV LAB;  Service: Cardiovascular;  Laterality: Left;   CATARACT EXTRACTION W/ INTRAOCULAR LENS IMPLANT Right    COLONOSCOPY WITH PROPOFOL N/A 09/19/2016   Procedure: COLONOSCOPY WITH PROPOFOL;  Surgeon: [name], Sara Munoz;  Location: [facility] ENDOSCOPY;  Service: Endoscopy;  Laterality: N/A;   DIAGNOSTIC LAPAROSCOPY     ESOPHAGOGASTRODUODENOSCOPY (EGD) WITH PROPOFOL N/A 09/18/2017   Procedure:  ESOPHAGOGASTRODUODENOSCOPY (EGD) WITH PROPOFOL;  Surgeon: [name], Sara Munoz;  Location: [facility] ENDOSCOPY;  Service: Gastroenterology;  Laterality: N/A;   ESOPHAGOGASTRODUODENOSCOPY (EGD) WITH PROPOFOL N/A 08/10/2019   Procedure: ESOPHAGOGASTRODUODENOSCOPY (EGD) WITH PROPOFOL;  Surgeon: [name], Sara Munoz;  Location: [facility] ENDOSCOPY;  Service: Endoscopy;  Laterality: N/A;   HEMORRHOID SURGERY     LEFT HEART CATH AND CORONARY ANGIOGRAPHY Left 07/07/2014   LEFT HEART CATH AND CORONARY ANGIOGRAPHY Left 2011   LUMBAR WOUND DEBRIDEMENT N/A 11/05/2022   Procedure: Thoracic one spinous process removal and wound revision;  Surgeon: Jadene Pierini, Sara Munoz;  Location: [facility] OR;  Service: Neurosurgery;  Laterality: N/A;   NASAL SINUS SURGERY     ORIF FEMUR FRACTURE Left    ORIF TIBIA & FIBULA FRACTURES Right    OVARIAN CYST SURGERY     POSTERIOR CERVICAL FUSION/FORAMINOTOMY N/A 05/14/2021   Procedure: Cervical five, Cervical six Laminectomy,foraminotomy with Cervical five-six Cervical six-seven Cervical seven-Thoracic one Posterior instrumented fusion;  Surgeon: Jadene Pierini, Sara Munoz;  Location: [facility];  Service: Neurosurgery;  Laterality: N/A;   SHOULDER ARTHROSCOPY WITH SUBACROMIAL DECOMPRESSION, ROTATOR CUFF REPAIR AND BICEP TENDON REPAIR Left 05/28/2022   Procedure: SHOULDER ARTHROSCOPY WITH DEBRIDEMENT, DECOMPRESSION, ROTATOR CUFF REPAIR AND BICEP TENDON REPAIR;  Surgeon: Christena Flake, Sara Munoz;  Location: [facility] ORS;  Service: Orthopedics;  Laterality: Left;   TRANSFORAMINAL LUMBAR INTERBODY FUSION W/ MIS 1 LEVEL Right 05/23/2020   Procedure: Right Lumbar Five Sacral One Minimally invasive transforaminal lumbar interbody fusion;  Surgeon: Jadene Pierini, Sara Munoz;  Location: [facility] OR;  Service: Neurosurgery;  Laterality: Right;  Right Lumbar Five Sacral One Minimally invasive transforaminal lumbar interbody fusion   Family History  Problem Relation Age of Onset   Heart disease Mother    Lung cancer Mother    Ovarian  cancer Mother    Healthy Brother    Healthy Brother    Healthy Brother    Diabetes Maternal Uncle    Breast cancer Maternal Aunt    Social History   Tobacco Use   Smoking status: Every Day    Packs/day: 0.50    Years: 35.00    Additional pack years: 0.00    Total pack years: 17.50    Types: Cigarettes   Smokeless tobacco: Never  Vaping Use   Vaping Use: Former   Quit date: 06/24/2013  Substance Use Topics   Alcohol use: Not Currently    Alcohol/week: 0.0 standard drinks of alcohol   Drug use: Not Currently    Pertinent Clinical Results:  LABS:   No visits with results within 3 Day(s) from this visit.  Latest known visit with results is:  Hospital Outpatient Visit on 10/30/2022  Component Date Value Ref Range Status   ABO/RH(D) 10/30/2022 O POS   Final   Antibody Screen 10/30/2022 NEG   Final   Sample Expiration 10/30/2022 11/13/2022,2359   Final   Extend sample reason 10/30/2022    Final  Value:NO TRANSFUSIONS OR PREGNANCY IN THE PAST 3 MONTHS Performed at [facility], [address] [place], [place] [address]    MRSA, PCR 10/30/2022 NEGATIVE  NEGATIVE Final   Staphylococcus aureus 10/30/2022 NEGATIVE  NEGATIVE Final   Comment: (NOTE) The Xpert SA Assay (FDA approved for NASAL specimens in patients 49 years of age and older), is one component of a comprehensive surveillance program. It is not intended to diagnose infection nor to guide or monitor treatment. Performed at [facility], [address], [place], [place] [address]    Sodium 10/30/2022 134 (L)  135 - 145 mmol/L Final   Potassium 10/30/2022 4.2  3.5 - 5.1 mmol/L Final   Chloride 10/30/2022 101  98 - 111 mmol/L Final   CO2 10/30/2022 23  22 - 32 mmol/L Final   Glucose, Bld 10/30/2022 90  70 - 99 mg/dL Final   Glucose reference range applies only to samples taken after fasting for at least 8 hours.   BUN 10/30/2022 13  6 - 20 mg/dL Final   Creatinine, Ser 10/30/2022 0.98   0.44 - 1.00 mg/dL Final   Calcium 82/95/6213 8.9  8.9 - 10.3 mg/dL Final   Total Protein 08/65/7846 6.5  6.5 - 8.1 g/dL Final   Albumin 96/29/5284 3.6  3.5 - 5.0 g/dL Final   AST 13/24/4010 20  15 - 41 U/L Final   ALT 10/30/2022 15  0 - 44 U/L Final   Alkaline Phosphatase 10/30/2022 73  38 - 126 U/L Final   Total Bilirubin 10/30/2022 0.4  0.3 - 1.2 mg/dL Final   GFR, Estimated 10/30/2022 >60  >60 mL/min Final   Comment: (NOTE) Calculated using the CKD-EPI Creatinine Equation (2021)    Anion gap 10/30/2022 10  5 - 15 Final   Performed at [facility], [address], [place], [place] [address]   WBC 10/30/2022 7.6  4.0 - 10.5 K/uL Final   RBC 10/30/2022 4.29  3.87 - 5.11 MIL/uL Final   Hemoglobin 10/30/2022 10.4 (L)  12.0 - 15.0 g/dL Final   HCT 66/44/0347 32.2 (L)  36.0 - 46.0 % Final   MCV 10/30/2022 75.1 (L)  80.0 - 100.0 fL Final   MCH 10/30/2022 24.2 (L)  26.0 - 34.0 pg Final   MCHC 10/30/2022 32.3  30.0 - 36.0 g/dL Final   RDW 42/59/5638 17.4 (H)  11.5 - 15.5 % Final   Platelets 10/30/2022 226  150 - 400 K/uL Final   nRBC 10/30/2022 0.0  0.0 - 0.2 % Final   Performed at [facility], [address] [place], [place] [address]    ECG: Date: 07/19/2022 Time ECG obtained: 1101 AM Rate: 67 bpm Rhythm: normal sinus Axis (leads I and aVF): Normal Intervals: PR 192 ms. QRS 76 ms. QTc 404 ms. ST segment and T wave changes: No evidence of acute ST segment elevation or depression Comparison: Similar to previous tracing obtained on 12/12/2021   IMAGING / PROCEDURES: CT CORONARY MORPH W/CTA COR W/SCORE W/CA W/CM &/OR WO/CM performed on 10/28/2022 Coronary calcium score of 1092. This was 99th percentile for age and sex matched control. Normal coronary origin with left dominance. Mild LM, proximal LAD and LCx stenosis (25-49%). Minimal RCA stenosis (<25%). CAD-RADS 2. Mild non-obstructive CAD (25-49%). Consider non-atherosclerotic causes of chest pain. Consider  preventive therapy and risk factor modification. Aortic atherosclerosis  VAS [place] ABI WITH/WO TBI performed on 10/21/2022 Resting right ankle-brachial index is within normal range. The  right toe-brachial index is normal.  Resting left ankle-brachial index is within normal range. The left toe-brachial index is normal.   TRANSTHORACIC ECHOCARDIOGRAM performed of 10/21/2022 Left ventricular ejection fraction, by estimation, is 60 to 65%. The left ventricle has normal function. The left ventricle has no regional  wall motion abnormalities. There is mild left ventricular hypertrophy. Left ventricular diastolic parameters were normal.  Right ventricular systolic function is normal. The right ventricular size is normal.  The mitral valve is normal in structure. Mild mitral valve regurgitation.  The aortic valve was not well visualized. Aortic valve regurgitation is not visualized. No aortic stenosis is present.  The inferior vena cava is normal in size with greater than 50% respiratory variability, suggesting right atrial pressure of 3 mmHg.   MR LUMBAR SPINE WO CONTRAST performed on 09/16/2022 At L5-S1 there is posterior lumbar interbody fusion. Mild-moderate right foraminal stenosis. No left foraminal stenosis. No spinal stenosis. At L4-5 there is a broad-based disc bulge. Mild bilateral facet arthropathy. Mild spinal stenosis. Bilateral lateral recess stenosis. Mild bilateral foraminal stenosis. No acute osseous injury of the lumbar spine.  MR SHOULDER LEFT WO CONTRAST performed on 05/11/2022 Minimal bursal sided degenerative fraying of the anterior supraspinatus tendon footprint. Mild-to-moderate infraspinatus tendinosis. 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 No full-thickness tendon tear or tendon retraction. Mild degenerative changes of the acromioclavicular joint. Mild subacromial/subdeltoid bursitis.    MYOCARDIAL PERFUSION IMAGING STUDY (LEXISCAN) performed on 09/01/2019 Normal left ventricular systolic function with a normal LVEF of 55-65% Normal myocardial thickening and wall motion Left ventricular cavity size normal SPECT images demonstrate homogenous tracer distribution throughout the myocardium No evidence of stress-induced myocardial ischemia or arrhythmia Normal low risk study  Impression and Plan:  Sara Munoz has been referred for pre-anesthesia review and clearance prior to her undergoing the planned anesthetic and procedural courses. Available labs, pertinent testing, and imaging results were personally reviewed by me in preparation for upcoming operative/procedural course. [facility] medical record has been updated following extensive record review and patient interview with PAT staff.   This patient has been appropriately cleared by cardiology with an overall ACCEPTABLE risk of experiencing significant perioperative cardiovascular complications. Based on clinical review performed today (11/29/22), barring any significant acute changes in the patient's overall condition, it is anticipated that she will be able to proceed with the planned surgical intervention. Any acute changes in clinical condition may necessitate her procedure being postponed and/or cancelled. Patient will meet with anesthesia team (Sara Munoz and/or CRNA) on the day of her procedure for preoperative evaluation/assessment. Questions regarding anesthetic course will be fielded at that time.   Pre-surgical instructions were reviewed with the patient during her PAT appointment, and questions were fielded to satisfaction by PAT clinical staff. She has been instructed on which medications that she will need to hold prior to surgery, as well as the ones that have been deemed safe/appropriate to take on the day of her procedure. As part of the general education provided by PAT, patient made aware both verbally and in writing,  that she would need to abstain from the use of any illegal substances during her perioperative course.  She was advised that failure to follow the provided instructions could necessitate case cancellation or result in serious perioperative complications up to and including death. Patient encouraged to contact PAT and/or her surgeon's office to discuss any questions or concerns that may arise prior to surgery; verbalized understanding.   Judie Grieve  Wallace Cullens, MSN, APRN, FNP-C, CEN [facility]  Peri-operative Services Nurse Practitioner Phone: [phone] Fax: [phone] 11/29/22 3:30 PM  NOTE: This note has been prepared using Dragon dictation software. Despite my best ability to proofread, there is always the potential that unintentional transcriptional errors may still occur from this process.

## 2022-12-02 ENCOUNTER — Encounter: Payer: Self-pay | Admitting: Obstetrics and Gynecology

## 2022-12-02 ENCOUNTER — Other Ambulatory Visit: Payer: 59 | Admitting: Obstetrics and Gynecology

## 2022-12-02 NOTE — Patient Outreach (Signed)
Medicaid Managed Care   Nurse Care Manager Note  12/02/2022 Name:  Sara Munoz MRN:  [id number] DOB:  1965/10/26  Sara Munoz is an 57 y.o. year old female who is a primary patient of [name], Tawanna Cooler, MD.  The Medicaid Managed Care Coordination team was consulted for assistance with:    Chronic healthcare management needs, GERD, CAD, HTN, tobacco use, COPD, OSA, anxiety, chronic pain, osteoarthritis, HLD  Ms. Inclan was given information about Medicaid Managed Care Coordination team services today. Sara Munoz Patient agreed to services and verbal consent obtained.  Engaged with patient by telephone for follow up visit in response to provider referral for case management and/or care coordination services.   Assessments/Interventions:  Review of past medical history, allergies, medications, health status, including review of consultants reports, laboratory and other test data, was performed as part of comprehensive evaluation and provision of chronic care management services.  SDOH (Social Determinants of Health) assessments and interventions performed: SDOH Interventions    Flowsheet Row Patient Outreach Telephone from 12/02/2022 in [place] POPULATION HEALTH DEPARTMENT Patient Outreach Telephone from 10/23/2022 in [place] POPULATION HEALTH DEPARTMENT Patient Outreach Telephone from 09/12/2022 in [place] POPULATION HEALTH DEPARTMENT Patient Outreach Telephone from 05/14/2022 in [place] POPULATION HEALTH DEPARTMENT Patient Outreach Telephone from 04/15/2022 in [facility] Care Coordination Patient Outreach Telephone from 03/15/2022 in [facility] Coordination  SDOH Interventions        Food Insecurity Interventions -- Intervention Not Indicated -- -- -- --  Housing Interventions -- -- -- Intervention Not Indicated -- --  Transportation Interventions -- -- -- -- Intervention Not Indicated --  Utilities Interventions  Intervention Not Indicated -- -- -- -- Intervention Not Indicated  Alcohol Usage Interventions Intervention Not Indicated (Score <7) -- -- -- -- Intervention Not Indicated (Score <7)  Financial Strain Interventions -- -- -- -- Intervention Not Indicated --  Physical Activity Interventions -- -- Intervention Not Indicated -- -- --  Stress Interventions -- -- -- Other (Comment)  [sees Psychiatrist and counselor] -- --     Care Plan  Allergies  Allergen Reactions   Cyclobenzaprine Hives, Swelling and Rash    Facial/lip swelling      Levofloxacin Hives, Swelling and Rash   Chlorpheniramine Maleate     Other reaction(s): Unknown   Phenergan [Promethazine Hcl] Other (See Comments)    Agitation.    Phenylephrine Hcl Other (See Comments)   Toradol [Ketorolac Tromethamine] Swelling and Other (See Comments)    Facial/tongue swelling    Zoloft [Sertraline Hcl] Swelling    Tongue swelling      Cephalexin Hives and Rash   Ketorolac Rash   Tramadol Hives, Swelling, Other (See Comments) and Rash    Lip swelling     Medications Reviewed Today     Reviewed by [name], RN (Registered Nurse) on 12/02/22 at 1536  Med List Status: <None>   Medication Order Taking? Sig Documenting Provider Last Dose Status Informant  albuterol (PROVENTIL) (2.5 MG/3ML) 0.083% nebulizer solution [id number] No Take 3 mLs (2.5 mg total) by nebulization every 6 (six) hours as needed for wheezing or shortness of breath. [name], [name], MD Past Month Active Self  albuterol (VENTOLIN HFA) 108 (90 Base) MCG/ACT inhaler [id number] No INHALE TWO PUFFS BY MOUTH INTO LUNGS EVERY 6 HOURS AS NEEDED FOR SHORTNESS OF BREATH OR wheezing  Patient taking differently: 1-2 puffs every 6 (six) hours as needed. INHALE TWO PUFFS BY MOUTH INTO LUNGS  EVERY 6 HOURS AS NEEDED FOR SHORTNESS OF BREATH OR wheezing   [name], [name], MD 11/05/2022 0500 Active Self  amLODipine (NORVASC) 5 MG tablet [id number] No Take 1  tablet (5 mg total) by mouth daily.  Patient taking differently: Take 5 mg by mouth at bedtime.   Ronnald Ramp, MD 11/04/2022 Active Self  aspirin EC 81 MG tablet [id number] No Take 81 mg by mouth daily. Swallow whole. [provider] Past Week Active Self  atorvastatin (LIPITOR) 40 MG tablet [id number] No Take 1 tablet (40 mg total) by mouth every evening. Debbe Odea, MD 11/04/2022 Active Self  docusate sodium (COLACE) 100 MG capsule [id number] No Take 100 mg by mouth 2 (two) times daily. [provider] 11/04/2022 Active Self  ELDERBERRY PO [id number]  Take 1 tablet by mouth daily at 6 (six) AM. [provider]  Active   gabapentin (NEURONTIN) 600 MG tablet [id number] No Take 600 mg by mouth 4 (four) times daily. [provider] 11/05/2022 0500 Active Self  lactulose (CHRONULAC) 10 GM/15ML solution [id number] No Take 30 mLs by mouth in the morning, at noon, in the evening, and at bedtime. [provider] 11/04/2022 Active Self  LINZESS 290 MCG CAPS capsule [id number] No Take 290 mcg by mouth daily as needed (constipation). [provider] Past Week Active Self  lisinopril (ZESTRIL) 20 MG tablet [id number] No Take 1 tablet (20 mg total) by mouth every evening.  Patient taking differently: Take 40 mg by mouth daily with lunch.   Debbe Odea, MD 11/04/2022 Active Self  metoprolol tartrate (LOPRESSOR) 25 MG tablet [id number] No TAKE ONE TABLET BY MOUTH TWICE DAILY [name], [name], MD 11/05/2022 0500 Active Self  mometasone (ELOCON) 0.1 % cream [id number] No Apply to area of irritation BID/day for 7 days max.  Patient taking differently: Apply 1 Application topically daily as needed (hand rash).   Jacky Kindle, FNP Past Month Active Self  mometasone-formoterol [facility]) 100-5 MCG/ACT Sandrea Matte [id number] No Inhale 2 puffs into the lungs daily.  Patient taking differently: Inhale 2 puffs into the lungs every morning.    Ronnald Ramp, MD 11/05/2022 0500 Active Self  Multiple Vitamins-Minerals (HAIR SKIN & NAILS PO) [id number]  Take 1 tablet by mouth daily at 6 (six) AM. [provider]  Active   naloxone [facility]) 4 MG/0.1ML LIQD nasal spray kit [id number] No Place 1 spray into the nose as needed (opioid overdose). [provider] Taking Active Self           Med Note Richardean Canal May 03, 2021 11:23 AM)    Omega-3 Fatty Acids (OMEGA 3 PO) [id number]  Take 1 capsule by mouth daily at 6 (six) AM. [provider]  Active   omeprazole (PRILOSEC) 40 MG capsule [id number] No TAKE 1 CAPSULE BY MOUTH TWICE A Edwyna Ready, MD 11/05/2022 0500 Active Self  ondansetron (ZOFRAN) 8 MG tablet [id number] No Take 1 tablet (8 mg total) by mouth every 8 (eight) hours as needed for nausea or vomiting. Jacky Kindle, FNP 11/04/2022 Active Self  oxyCODONE (ROXICODONE) 15 MG immediate release tablet [id number] No Take 15 mg by mouth every 4 (four) hours as needed for pain. [provider] 11/05/2022 0500 Active Self  sucralfate (CARAFATE) 1 GM/10ML suspension [id number] No TAKE 10 MLS (1 G TOTAL) BY MOUTH 4 (FOUR) TIMES DAILY. [name], MD 11/04/2022 Active Self  Tiotropium Bromide Monohydrate (SPIRIVA RESPIMAT) 2.5 MCG/ACT AERS [id number]  INHALE 1 PUFF BY MOUTH INTO LUNGS ONCE  DAILY [name], [name], MD  Active Self  TURMERIC PO [id number]  Take 1 tablet by mouth daily at 6 (six) AM. [provider]  Active            Patient Active Problem List   Diagnosis Date Noted   Atopic dermatitis of both hands 09/12/2022   Headache disorder 08/13/2022   Encounter for annual physical examination excluding gynecological examination in a patient older than 17 years 04/03/2022   Essential hypertension 03/11/2022   Lumbar spondylosis 02/12/2022   Chronic migraine w/o aura w/o status migrainosus, not intractable 07/09/2021   Obstructive sleep apnea 07/09/2021   Body mass  index (BMI) 32.0-32.9, adult 06/07/2021   Pseudoarthrosis of cervical spine (HCC) 05/14/2021   Lumbar radiculopathy 05/23/2020   Sacroiliac inflammation (HCC) 11/25/2019   Sepsis (HCC) 10/19/2018   Severe sepsis (HCC) 10/19/2018   Cervical radiculopathy 02/12/2018   Chronic migraine 02/12/2018   Paresthesia 08/29/2017   Neck pain 08/29/2017   Daily headache 08/29/2017   Chronic active hepatitis (HCC) 07/29/2017   Neurogenic pain 07/31/2016   Chronic pain syndrome 06/05/2016   Carotid artery stenosis 02/27/2016   Chronic constipation 02/27/2016   Chronic nausea 02/27/2016   Hypercholesterolemia 02/27/2016   Osteoarthritis 02/27/2016   PTSD (post-traumatic stress disorder) 02/27/2016   TIA (transient ischemic attack) 02/27/2016   Long term current use of opiate analgesic 02/27/2016   Long term prescription opiate use 02/27/2016   Encounter for pain management consult 02/27/2016   Chronic hip pain (Location of Tertiary source of pain) (Bilateral) (L>R) 02/27/2016   Chronic knee pain (Bilateral) (L>R) 02/27/2016   Chronic shoulder pain (Bilateral) (L>R) 02/27/2016   Chronic sacroiliac joint pain (Bilateral) (L>R) 02/27/2016   Chronic low back pain (Location of Primary Source of Pain) (Bilateral) (L>R) 02/27/2016   Chronic lower extremity pain (Location of Secondary source of pain) (Bilateral) (L>R) 02/27/2016   Osteoarthritis of hip (Bilateral) (L>R) 02/27/2016   Chronic neck pain (posterior midline) (Bilateral) (L>R) 02/27/2016   Cervicogenic headache (Bilateral) (L>R) 02/27/2016   Occipital headache (Bilateral) (L>R) 02/27/2016   Chronic upper extremity pain (Bilateral) (L>R) 02/27/2016   Chronic Cervical radicular pain (Bilateral) (L>R) 02/27/2016   Chronic lumbar radicular pain (Right) (L5 dermatome) 02/27/2016   Lumbar facet syndrome (Bilateral) (L>R) 02/27/2016   Long term prescription benzodiazepine use 02/27/2016   Hypertension    Chronic obstructive pulmonary disease  (HCC) 10/09/2015   GERD (gastroesophageal reflux disease) 10/09/2015   Non-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/2014 08/02/2015   Hyperlipidemia 08/02/2015   Costochondritis 08/02/2015   Drug abuse, 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   GAD (generalized anxiety disorder) 09/03/2014   Narcotic dependence (HCC) 09/03/2014   PVD (peripheral vascular disease) (HCC) 09/03/2014   Smoker 09/03/2014   Cigarette nicotine dependence with nicotine-induced disorder 07/08/2014   Anemia of chronic disease 03/19/2014   Narcotic abuse, continuous (HCC) 03/19/2014   Polysubstance abuse (HCC) 03/19/2014   MSSA (methicillin susceptible Staphylococcus aureus) septicemia (HCC) 03/07/2014   Hypomagnesemia 01/04/2014   Chronic cough 09/04/2011   Sleep apnea, obstructive 09/04/2011   Sinusitis, acute 09/04/2011   Conditions to be addressed/monitored per PCP order:  Chronic healthcare management needs, GERD, CAD, HTN, tobacco use, COPD, OSA, anxiety, chronic pain, osteoarthritis, HLD  Care Plan : RN Care Manager Plan of Care  Updates made by [name], RN since 12/02/2022 12:00 AM     Problem: Health Promotion or Disease Self-Management (General Plan of Care)      Long-Range Goal: Chronic Disease Management   Start  Date: 09/12/2022  Expected End Date: 03/04/2023  Priority: High  Note:   Current Barriers:  Knowledge Deficits related to plan of care for management of GERD, CAD, HTN, PVD, TIA, migranes, COPD, OSA, tobacco use, osteoarthritis, chronic pain, anxiety  Chronic Disease Management support and education needs related to  GERD, CAD, HTN, PVD, TIA, migranes, COPD, OSA, tobacco use, osteoarthritis, chronic pain, anxiety  12/02/22: To have left rotator cuff surgery tomorrow.  Needs to schedule GI appt.  Pain Management Friday  RNCM Clinical Goal(s):  Patient will verbalize understanding of plan for management of GERD, CAD, HTN, PVD, TIA, migranes, COPD, OSA, tobacco use, osteoarthritis, chronic pain, anxiety   as  evidenced by patient report verbalize basic understanding of  GERD, CAD, HTN, PVD, TIA, migranes, COPD, OSA, tobacco use, osteoarthritis, chronic pain, anxiety  disease process and self health management plan as evidenced by patient report take all medications exactly as prescribed and will call provider for medication related questions as evidenced by patient report  demonstrate understanding of rationale for each prescribed medication as evidenced by patient report attend all scheduled medical appointments as evidenced by patient report and EMR review demonstrate Ongoing adherence to prescribed treatment plan for  GERD, CAD, HTN, PVD, TIA, migranes, COPD, OSA, tobacco use, osteoarthritis, chronic pain, anxiety as evidenced by patient report continue to work with RN Care Manager to address care management and care coordination needs related to GERD, CAD, HTN, PVD, TIA, migranes, COPD, OSA, tobacco use, osteoarthritis, chronic pain, anxiety  as evidenced by adherence to CM Team Scheduled appointments through collaboration with RN Care manager, provider, and care team.   Interventions: Inter-disciplinary care team collaboration (see longitudinal plan of care) Evaluation of current treatment plan related to  self management and patient's adherence to plan as established by provider  CAD Interventions: (Status:  New goal.) Long Term Goal Assessed understanding of CAD diagnosis Medications reviewed including medications utilized in CAD treatment plan Counseled on importance of regular laboratory monitoring as prescribed Reviewed Importance of taking all medications as prescribed Reviewed Importance of attending all scheduled provider appointments Advised to report any changes in symptoms or exercise tolerance Screening for signs and symptoms of depression related to chronic disease state Assessed social determinant of health barriers  COPD Interventions:  (Status:  New goal.) Long Term  Goal Advised patient to self assesses COPD action plan zone and make appointment with provider if in the yellow zone for 48 hours without improvement Advised patient to engage in light exercise as tolerated 3-5 days a week to aid in the the management of COPD Provided education about and advised patient to utilize infection prevention strategies to reduce risk of respiratory infection Discussed the importance of adequate rest and management of fatigue with COPD Assessed social determinant of health barriers  Hypertension Interventions:  (Status:  New goal.) Long Term Goal Last practice recorded BP readings:  BP Readings from Last 3 Encounters:  09/12/22 116/75  08/13/22 124/77  07/19/22 100/62  10/23/22         130/78 12/02/22         159/78  Most recent eGFR/CrCl:  Lab Results  Component Value Date   EGFR 83 04/08/2022    No components found for: "CRCL"  Evaluation of current treatment plan related to hypertension self management and patient's adherence to plan as established by provider Reviewed medications with patient and discussed importance of compliance Discussed plans with patient for ongoing care management follow up and provided patient with direct contact  information for care management team Advised patient, providing education and rationale, to monitor blood pressure daily and record, calling PCP for findings outside established parameters Reviewed scheduled/upcoming provider appointments including:  Discussed complications of poorly controlled blood pressure such as heart disease, stroke, circulatory complications, vision complications, kidney impairment, sexual dysfunction Assessed social determinant of health barriers  Pain Interventions:  (Status:  New goal.) Long Term Goal Pain assessment performed Medications reviewed Reviewed provider established plan for pain management Discussed importance of adherence to all scheduled medical appointments Counseled on the  importance of reporting any/all new or changed pain symptoms or management strategies to pain management provider Advised patient to report to care team affect of pain on daily activities Reviewed with patient prescribed pharmacological and nonpharmacological pain relief strategies Assessed social determinant of health barriers  Patient Goals/Self-Care Activities: Take all medications as prescribed Attend all scheduled provider appointments Call pharmacy for medication refills 3-7 days in advance of running out of medications Perform all self care activities independently  Perform IADL's (shopping, preparing meals, housekeeping, managing finances) independently Call provider office for new concerns or questions   Follow Up Plan:  The patient has been provided with contact information for the care management team and has been advised to call with any health related questions or concerns.  The care management team will reach out to the patient again over the next 30 business  days.   Follow Up:  Patient agrees to Care Plan and Follow-up.  Plan: The Managed Medicaid care management team will reach out to the patient again over the next 30 business  days. and The  Patient has been provided with contact information for the Managed Medicaid care management team and has been advised to call with any health related questions or concerns.  Date/time of next scheduled RN care management/care coordination outreach: 01/01/23 at 1230

## 2022-12-02 NOTE — Patient Instructions (Signed)
Hi Sara Munoz, I am glad you are recovering from surgery okay, I hope tomorrow goes well!!  Sara Munoz was given information about Medicaid Managed Care team care coordination services as a part of their [place] Medicaid benefit. [name] verbally consented to engagement with the [facility] Managed Care team.   If you are experiencing a medical emergency, please call 911 or report to your local emergency department or urgent care.   If you have a non-emergency medical problem during routine business hours, please contact your provider's office and ask to speak with a nurse.   For questions related to your [place] health plan, please call: [phone]  If you would like to schedule transportation through your [place] plan, please call the following number at least 2 days in advance of your appointment: [phone].   There is no limit to the number of trips during the year between medical appointments, healthcare facilities, or pharmacies. Transportation must be scheduled at least 2 business days before but not more than thirty 30 days before of your appointment.  Call the Behavioral Health Crisis Line at [phone], at any time, 24 hours a day, 7 days a week. If you are in danger or need immediate medical attention call 911.  If you would like help to quit smoking, call 1-800-QUIT-NOW ((718)877-2870) OR Espaol: 1-855-Djelo-Ya (5-366-440-3474) o para ms informacin haga clic aqu or Text READY to 259-563 to register via text  [name] - following are the goals we discussed in your visit today  Timeframe:  Long-Range Goal Priority:  High Start Date:       12/20/20                      Expected End Date:   ongoing             - schedule appointment for flu shot - schedule appointment for vaccines needed due to my age or health - schedule recommended health tests (blood work, mammogram, colonoscopy, pap test) - schedule and keep  appointment for annual check-up   Why is this important?   Screening tests can find diseases early when they are easier to treat.  Your doctor or nurse will talk with you about which tests are important for you.  Getting shots for common diseases like the flu and shingles will help prevent them.   12/02/22: To have rotator cuff surgery tomorrow, pain management on Friday.  To schedule GI appt.  Patient verbalizes understanding of instructions and care plan provided today and agrees to view in MyChart. Active MyChart status and patient understanding of how to access instructions and care plan via MyChart confirmed with patient.     The Managed Medicaid care management team will reach out to the patient again over the next 30 business  days.  The  Patient   has been provided with contact information for the Managed Medicaid care management team and has been advised to call with any health related questions or concerns.   Kathi Der RN, BSN [place]  [facility] Care Management Coordinator - Managed Medicaid High Risk [phone]   Following is a copy of your plan of care:  Care Plan : RN Care Manager Plan of Care  Updates made by [name], RN since 12/02/2022 12:00 AM     Problem: Health Promotion or Disease Self-Management (General Plan of Care)      Long-Range Goal: Chronic Disease Management   Start  Date: 09/12/2022  Expected End Date: 03/04/2023  Priority: High  Note:   Current Barriers:  Knowledge Deficits related to plan of care for management of GERD, CAD, HTN, PVD, TIA, migranes, COPD, OSA, tobacco use, osteoarthritis, chronic pain, anxiety  Chronic Disease Management support and education needs related to  GERD, CAD, HTN, PVD, TIA, migranes, COPD, OSA, tobacco use, osteoarthritis, chronic pain, anxiety  12/02/22: To have left rotator cuff surgery tomorrow.  Needs to schedule GI appt.  Pain Management Friday  RNCM Clinical Goal(s):  Patient will  verbalize understanding of plan for management of GERD, CAD, HTN, PVD, TIA, migranes, COPD, OSA, tobacco use, osteoarthritis, chronic pain, anxiety   as evidenced by patient report verbalize basic understanding of  GERD, CAD, HTN, PVD, TIA, migranes, COPD, OSA, tobacco use, osteoarthritis, chronic pain, anxiety  disease process and self health management plan as evidenced by patient report take all medications exactly as prescribed and will call provider for medication related questions as evidenced by patient report  demonstrate understanding of rationale for each prescribed medication as evidenced by patient report attend all scheduled medical appointments as evidenced by patient report and EMR review demonstrate Ongoing adherence to prescribed treatment plan for  GERD, CAD, HTN, PVD, TIA, migranes, COPD, OSA, tobacco use, osteoarthritis, chronic pain, anxiety as evidenced by patient report continue to work with RN Care Manager to address care management and care coordination needs related to GERD, CAD, HTN, PVD, TIA, migranes, COPD, OSA, tobacco use, osteoarthritis, chronic pain, anxiety  as evidenced by adherence to CM Team Scheduled appointments through collaboration with RN Care manager, provider, and care team.   Interventions: Inter-disciplinary care team collaboration (see longitudinal plan of care) Evaluation of current treatment plan related to  self management and patient's adherence to plan as established by provider  CAD Interventions: (Status:  New goal.) Long Term Goal Assessed understanding of CAD diagnosis Medications reviewed including medications utilized in CAD treatment plan Counseled on importance of regular laboratory monitoring as prescribed Reviewed Importance of taking all medications as prescribed Reviewed Importance of attending all scheduled provider appointments Advised to report any changes in symptoms or exercise tolerance Screening for signs and symptoms of  depression related to chronic disease state Assessed social determinant of health barriers  COPD Interventions:  (Status:  New goal.) Long Term Goal Advised patient to self assesses COPD action plan zone and make appointment with provider if in the yellow zone for 48 hours without improvement Advised patient to engage in light exercise as tolerated 3-5 days a week to aid in the the management of COPD Provided education about and advised patient to utilize infection prevention strategies to reduce risk of respiratory infection Discussed the importance of adequate rest and management of fatigue with COPD Assessed social determinant of health barriers  Hypertension Interventions:  (Status:  New goal.) Long Term Goal Last practice recorded BP readings:  BP Readings from Last 3 Encounters:  09/12/22 116/75  08/13/22 124/77  07/19/22 100/62  10/23/22         130/78 12/02/22         159/78  Most recent eGFR/CrCl:  Lab Results  Component Value Date   EGFR 83 04/08/2022    No components found for: "CRCL"  Evaluation of current treatment plan related to hypertension self management and patient's adherence to plan as established by provider Reviewed medications with patient and discussed importance of compliance Discussed plans with patient for ongoing care management follow up and provided patient with direct contact  information for care management team Advised patient, providing education and rationale, to monitor blood pressure daily and record, calling PCP for findings outside established parameters Reviewed scheduled/upcoming provider appointments including:  Discussed complications of poorly controlled blood pressure such as heart disease, stroke, circulatory complications, vision complications, kidney impairment, sexual dysfunction Assessed social determinant of health barriers  Pain Interventions:  (Status:  New goal.) Long Term Goal Pain assessment performed Medications  reviewed Reviewed provider established plan for pain management Discussed importance of adherence to all scheduled medical appointments Counseled on the importance of reporting any/all new or changed pain symptoms or management strategies to pain management provider Advised patient to report to care team affect of pain on daily activities Reviewed with patient prescribed pharmacological and nonpharmacological pain relief strategies Assessed social determinant of health barriers  Patient Goals/Self-Care Activities: Take all medications as prescribed Attend all scheduled provider appointments Call pharmacy for medication refills 3-7 days in advance of running out of medications Perform all self care activities independently  Perform IADL's (shopping, preparing meals, housekeeping, managing finances) independently Call provider office for new concerns or questions   Follow Up Plan:  The patient has been provided with contact information for the care management team and has been advised to call with any health related questions or concerns.  The care management team will reach out to the patient again over the next 30 business  days.

## 2022-12-03 ENCOUNTER — Encounter
Admission: RE | Disposition: A | Discharge status: Home / Self Care | Payer: Self-pay | Source: Home / Self Care | Attending: Surgery

## 2022-12-03 ENCOUNTER — Other Ambulatory Visit: Payer: Self-pay

## 2022-12-03 ENCOUNTER — Ambulatory Visit
Admission: RE | Admit: 2022-12-03 | Discharge: 2022-12-03 | Disposition: A | Discharge status: Home / Self Care | Payer: 59 | Attending: Surgery | Admitting: Surgery

## 2022-12-03 ENCOUNTER — Ambulatory Visit: Payer: 59 | Admitting: Urgent Care

## 2022-12-03 ENCOUNTER — Encounter: Payer: Self-pay | Admitting: Surgery

## 2022-12-03 ENCOUNTER — Ambulatory Visit: Payer: Medicaid Other | Admitting: Neurology

## 2022-12-03 DIAGNOSIS — D649 Anemia, unspecified: Secondary | ICD-10-CM | POA: Insufficient documentation

## 2022-12-03 DIAGNOSIS — Z79891 Long term (current) use of opiate analgesic: Secondary | ICD-10-CM | POA: Insufficient documentation

## 2022-12-03 DIAGNOSIS — I252 Old myocardial infarction: Secondary | ICD-10-CM | POA: Diagnosis not present

## 2022-12-03 DIAGNOSIS — I2510 Atherosclerotic heart disease of native coronary artery without angina pectoris: Secondary | ICD-10-CM | POA: Diagnosis not present

## 2022-12-03 DIAGNOSIS — I10 Essential (primary) hypertension: Secondary | ICD-10-CM | POA: Diagnosis not present

## 2022-12-03 DIAGNOSIS — I739 Peripheral vascular disease, unspecified: Secondary | ICD-10-CM | POA: Diagnosis not present

## 2022-12-03 DIAGNOSIS — F17210 Nicotine dependence, cigarettes, uncomplicated: Secondary | ICD-10-CM | POA: Diagnosis not present

## 2022-12-03 DIAGNOSIS — K219 Gastro-esophageal reflux disease without esophagitis: Secondary | ICD-10-CM | POA: Diagnosis not present

## 2022-12-03 DIAGNOSIS — M5030 Other cervical disc degeneration, unspecified cervical region: Secondary | ICD-10-CM | POA: Insufficient documentation

## 2022-12-03 DIAGNOSIS — M5136 Other intervertebral disc degeneration, lumbar region: Secondary | ICD-10-CM | POA: Diagnosis not present

## 2022-12-03 DIAGNOSIS — Z9889 Other specified postprocedural states: Secondary | ICD-10-CM | POA: Diagnosis not present

## 2022-12-03 DIAGNOSIS — Z8673 Personal history of transient ischemic attack (TIA), and cerebral infarction without residual deficits: Secondary | ICD-10-CM | POA: Diagnosis not present

## 2022-12-03 DIAGNOSIS — G4730 Sleep apnea, unspecified: Secondary | ICD-10-CM | POA: Diagnosis not present

## 2022-12-03 DIAGNOSIS — D759 Disease of blood and blood-forming organs, unspecified: Secondary | ICD-10-CM | POA: Diagnosis not present

## 2022-12-03 DIAGNOSIS — J449 Chronic obstructive pulmonary disease, unspecified: Secondary | ICD-10-CM | POA: Diagnosis not present

## 2022-12-03 DIAGNOSIS — M25512 Pain in left shoulder: Secondary | ICD-10-CM | POA: Insufficient documentation

## 2022-12-03 DIAGNOSIS — J432 Centrilobular emphysema: Secondary | ICD-10-CM

## 2022-12-03 HISTORY — DX: Disorder of arteries and arterioles, unspecified: I77.9

## 2022-12-03 HISTORY — DX: Migraine, unspecified, not intractable, without status migrainosus: G43.909

## 2022-12-03 HISTORY — DX: Other shoulder lesions, left shoulder: M75.82

## 2022-12-03 HISTORY — DX: Other cervical disc degeneration, unspecified cervical region: M50.30

## 2022-12-03 HISTORY — DX: Personal history of other infectious and parasitic diseases: Z86.19

## 2022-12-03 HISTORY — PX: SHOULDER ARTHROSCOPY WITH SUBACROMIAL DECOMPRESSION, ROTATOR CUFF REPAIR AND BICEP TENDON REPAIR: SHX5687

## 2022-12-03 HISTORY — DX: Fatty (change of) liver, not elsewhere classified: K76.0

## 2022-12-03 HISTORY — DX: Incomplete rotator cuff tear or rupture of left shoulder, not specified as traumatic: M75.112

## 2022-12-03 HISTORY — DX: Atherosclerosis of aorta: I70.0

## 2022-12-03 HISTORY — DX: Transient cerebral ischemic attack, unspecified: G45.9

## 2022-12-03 HISTORY — DX: Post-traumatic stress disorder, unspecified: F43.10

## 2022-12-03 SURGERY — SHOULDER ARTHROSCOPY WITH SUBACROMIAL DECOMPRESSION, ROTATOR CUFF REPAIR AND BICEP TENDON REPAIR
Anesthesia: General | Site: Shoulder | Laterality: Left

## 2022-12-03 MED ORDER — ONDANSETRON HCL 4 MG/2ML IJ SOLN
INTRAMUSCULAR | Status: DC | PRN
  Administered 2022-12-03: 4 mg via INTRAVENOUS

## 2022-12-03 MED ORDER — PROPOFOL 10 MG/ML IV BOLUS
INTRAVENOUS | Status: DC | PRN
  Administered 2022-12-03: 200 mg via INTRAVENOUS

## 2022-12-03 MED ORDER — CEFAZOLIN SODIUM-DEXTROSE 2-3 GM-%(50ML) IV SOLR
INTRAVENOUS | Status: DC | PRN
  Administered 2022-12-03: 2 g via INTRAVENOUS

## 2022-12-03 MED ORDER — EPINEPHRINE PF 1 MG/ML IJ SOLN
INTRAMUSCULAR | Status: AC
  Filled 2022-12-03: qty 2

## 2022-12-03 MED ORDER — ACETAMINOPHEN 10 MG/ML IV SOLN
INTRAVENOUS | Status: DC | PRN
  Administered 2022-12-03: 1000 mg via INTRAVENOUS

## 2022-12-03 MED ORDER — KETAMINE HCL 10 MG/ML IJ SOLN
INTRAMUSCULAR | Status: DC | PRN
  Administered 2022-12-03: 25 mg via INTRAVENOUS

## 2022-12-03 MED ORDER — LACTATED RINGERS IV SOLN: INTRAVENOUS | Status: DC

## 2022-12-03 MED ORDER — OXYCODONE HCL 5 MG PO TABS
15.0000 mg | ORAL_TABLET | Freq: Once | ORAL | Status: AC
  Administered 2022-12-03: 15 mg via ORAL

## 2022-12-03 MED ORDER — KETAMINE HCL 50 MG/5ML IJ SOSY
PREFILLED_SYRINGE | INTRAMUSCULAR | Status: AC
  Filled 2022-12-03: qty 5

## 2022-12-03 MED ORDER — OXYCODONE HCL 5 MG PO TABS: 5.0000 mg | ORAL_TABLET | ORAL | 0 refills | Status: DC | PRN

## 2022-12-03 MED ORDER — AMLODIPINE BESYLATE 5 MG PO TABS
5.0000 mg | ORAL_TABLET | Freq: Every evening | ORAL | Status: DC
Start: 2022-12-03 — End: 2022-12-05

## 2022-12-03 MED ORDER — CHLORHEXIDINE GLUCONATE 0.12 % MT SOLN
OROMUCOSAL | Status: AC
  Filled 2022-12-03: qty 15

## 2022-12-03 MED ORDER — MIDAZOLAM HCL 2 MG/2ML IJ SOLN
INTRAMUSCULAR | Status: AC
  Filled 2022-12-03: qty 2

## 2022-12-03 MED ORDER — SODIUM CHLORIDE (PF) 0.9 % IJ SOLN
INTRAMUSCULAR | Status: AC
  Filled 2022-12-03: qty 20

## 2022-12-03 MED ORDER — PROPOFOL 10 MG/ML IV BOLUS
INTRAVENOUS | Status: AC
  Filled 2022-12-03: qty 20

## 2022-12-03 MED ORDER — BUPIVACAINE HCL (PF) 0.5 % IJ SOLN
INTRAMUSCULAR | Status: AC
  Filled 2022-12-03: qty 30

## 2022-12-03 MED ORDER — FENTANYL CITRATE (PF) 100 MCG/2ML IJ SOLN
INTRAMUSCULAR | Status: AC
  Filled 2022-12-03: qty 2

## 2022-12-03 MED ORDER — RINGERS IRRIGATION IR SOLN
Status: DC | PRN
  Administered 2022-12-03: 1

## 2022-12-03 MED ORDER — LIDOCAINE HCL (PF) 2 % IJ SOLN
INTRAMUSCULAR | Status: AC
  Filled 2022-12-03: qty 5

## 2022-12-03 MED ORDER — ORAL CARE MOUTH RINSE: 15.0000 mL | Freq: Once | OROMUCOSAL | Status: AC

## 2022-12-03 MED ORDER — LACTATED RINGERS IV SOLN
INTRAVENOUS | Status: DC | PRN
  Administered 2022-12-03: 3001 mL

## 2022-12-03 MED ORDER — ROCURONIUM BROMIDE 100 MG/10ML IV SOLN
INTRAVENOUS | Status: DC | PRN
  Administered 2022-12-03: 60 mg via INTRAVENOUS

## 2022-12-03 MED ORDER — PHENYLEPHRINE HCL-NACL 20-0.9 MG/250ML-% IV SOLN
INTRAVENOUS | Status: AC
  Filled 2022-12-03: qty 250

## 2022-12-03 MED ORDER — BUPIVACAINE-EPINEPHRINE 0.5% -1:200000 IJ SOLN
INTRAMUSCULAR | Status: DC | PRN
  Administered 2022-12-03: 30 mL via INTRAMUSCULAR

## 2022-12-03 MED ORDER — VANCOMYCIN HCL IN DEXTROSE 1-5 GM/200ML-% IV SOLN
1000.0000 mg | INTRAVENOUS | Status: AC
  Administered 2022-12-03: 1000 mg via INTRAVENOUS

## 2022-12-03 MED ORDER — ACETAMINOPHEN 10 MG/ML IV SOLN
INTRAVENOUS | Status: AC
  Filled 2022-12-03: qty 100

## 2022-12-03 MED ORDER — OXYCODONE HCL 5 MG PO TABS
ORAL_TABLET | ORAL | Status: AC
  Filled 2022-12-03: qty 3

## 2022-12-03 MED ORDER — MOMETASONE FUROATE 0.1 % EX CREA: 1.0000 | TOPICAL_CREAM | Freq: Every day | CUTANEOUS | Status: DC | PRN

## 2022-12-03 MED ORDER — ALBUTEROL SULFATE HFA 108 (90 BASE) MCG/ACT IN AERS: 1.0000 | INHALATION_SPRAY | Freq: Four times a day (QID) | RESPIRATORY_TRACT | Status: DC | PRN

## 2022-12-03 MED ORDER — VANCOMYCIN HCL IN DEXTROSE 1-5 GM/200ML-% IV SOLN
INTRAVENOUS | Status: AC
  Filled 2022-12-03: qty 200

## 2022-12-03 MED ORDER — MIDAZOLAM HCL 2 MG/2ML IJ SOLN
INTRAMUSCULAR | Status: DC | PRN
  Administered 2022-12-03: 2 mg via INTRAVENOUS

## 2022-12-03 MED ORDER — CEFAZOLIN SODIUM 1 G IJ SOLR
INTRAMUSCULAR | Status: AC
  Filled 2022-12-03: qty 20

## 2022-12-03 MED ORDER — ROCURONIUM BROMIDE 10 MG/ML (PF) SYRINGE
PREFILLED_SYRINGE | INTRAVENOUS | Status: AC
  Filled 2022-12-03: qty 10

## 2022-12-03 MED ORDER — ONDANSETRON HCL 4 MG/2ML IJ SOLN
INTRAMUSCULAR | Status: AC
  Filled 2022-12-03: qty 2

## 2022-12-03 MED ORDER — SUGAMMADEX SODIUM 200 MG/2ML IV SOLN
INTRAVENOUS | Status: DC | PRN
  Administered 2022-12-03: 200 mg via INTRAVENOUS

## 2022-12-03 MED ORDER — DEXAMETHASONE SODIUM PHOSPHATE 10 MG/ML IJ SOLN
INTRAMUSCULAR | Status: DC | PRN
  Administered 2022-12-03: 10 mg via INTRAVENOUS

## 2022-12-03 MED ORDER — LIDOCAINE HCL (CARDIAC) PF 100 MG/5ML IV SOSY
PREFILLED_SYRINGE | INTRAVENOUS | Status: DC | PRN
  Administered 2022-12-03: 100 mg via INTRAVENOUS

## 2022-12-03 MED ORDER — DULERA 100-5 MCG/ACT IN AERO
2.0000 | INHALATION_SPRAY | RESPIRATORY_TRACT | Status: DC
Start: 2022-12-03 — End: 2023-07-07

## 2022-12-03 MED ORDER — GENTAMICIN SULFATE 40 MG/ML IJ SOLN
5.0000 mg/kg | INTRAVENOUS | Status: AC
  Administered 2022-12-03: 430 mg via INTRAVENOUS
  Filled 2022-12-03: qty 10.75

## 2022-12-03 MED ORDER — DEXAMETHASONE SODIUM PHOSPHATE 10 MG/ML IJ SOLN
INTRAMUSCULAR | Status: AC
  Filled 2022-12-03: qty 1

## 2022-12-03 MED ORDER — CHLORHEXIDINE GLUCONATE 0.12 % MT SOLN
15.0000 mL | Freq: Once | OROMUCOSAL | Status: AC
  Administered 2022-12-03: 15 mL via OROMUCOSAL

## 2022-12-03 MED ORDER — BUPIVACAINE LIPOSOME 1.3 % IJ SUSP
INTRAMUSCULAR | Status: AC
  Filled 2022-12-03: qty 20

## 2022-12-03 MED ORDER — LISINOPRIL 20 MG PO TABS
40.0000 mg | ORAL_TABLET | Freq: Every day | ORAL | Status: DC
Start: 2022-12-03 — End: 2022-12-05

## 2022-12-03 MED ORDER — FENTANYL CITRATE (PF) 100 MCG/2ML IJ SOLN
INTRAMUSCULAR | Status: DC | PRN
  Administered 2022-12-03: 100 ug via INTRAVENOUS

## 2022-12-03 SURGICAL SUPPLY — 48 items
APL PRP STRL LF DISP 70% ISPRP (MISCELLANEOUS) ×1
BIT DRILL JUGRKNT W/NDL BIT2.9 (DRILL) IMPLANT
BLADE FULL RADIUS 3.5 (BLADE) ×1 IMPLANT
BUR ACROMIONIZER 4.0 (BURR) ×1 IMPLANT
CANNULA SHAVER 8MMX76MM (CANNULA) ×1 IMPLANT
CHLORAPREP W/TINT 26 (MISCELLANEOUS) ×1 IMPLANT
COVER MAYO STAND REUSABLE (DRAPES) ×1 IMPLANT
DILATOR 5.5 THREADED HEALICOIL (MISCELLANEOUS) IMPLANT
DRILL JUGGERKNOT W/NDL BIT 2.9 (DRILL)
ELECT CAUTERY BLADE 6.4 (BLADE) ×1 IMPLANT
ELECT REM PT RETURN 9FT ADLT (ELECTROSURGICAL) ×1
ELECTRODE REM PT RTRN 9FT ADLT (ELECTROSURGICAL) ×1 IMPLANT
GAUZE SPONGE 4X4 12PLY STRL (GAUZE/BANDAGES/DRESSINGS) ×1 IMPLANT
GAUZE XEROFORM 1X8 LF (GAUZE/BANDAGES/DRESSINGS) ×1 IMPLANT
GLOVE BIO SURGEON STRL SZ7.5 (GLOVE) ×2 IMPLANT
GLOVE BIO SURGEON STRL SZ8 (GLOVE) ×2 IMPLANT
GLOVE BIOGEL PI IND STRL 8 (GLOVE) ×1 IMPLANT
GLOVE INDICATOR 8.0 STRL GRN (GLOVE) ×1 IMPLANT
GOWN STRL REUS W/ TWL LRG LVL3 (GOWN DISPOSABLE) ×1 IMPLANT
GOWN STRL REUS W/ TWL XL LVL3 (GOWN DISPOSABLE) ×1 IMPLANT
GOWN STRL REUS W/TWL LRG LVL3 (GOWN DISPOSABLE) ×1
GOWN STRL REUS W/TWL XL LVL3 (GOWN DISPOSABLE) ×1
GRASPER SUT 15 45D LOW PRO (SUTURE) IMPLANT
IV LACTATED RINGER IRRG 3000ML (IV SOLUTION) ×2
IV LR IRRIG 3000ML ARTHROMATIC (IV SOLUTION) ×2 IMPLANT
KIT CANNULA 8X76-LX IN CANNULA (CANNULA) ×1 IMPLANT
MANIFOLD NEPTUNE II (INSTRUMENTS) ×2 IMPLANT
MASK FACE SPIDER DISP (MASK) ×1 IMPLANT
MAT ABSORB FLUID 56X50 GRAY (MISCELLANEOUS) ×1 IMPLANT
PACK ARTHROSCOPY SHOULDER (MISCELLANEOUS) ×1 IMPLANT
PAD ABD DERMACEA PRESS 5X9 (GAUZE/BANDAGES/DRESSINGS) ×2 IMPLANT
PASSER SUT FIRSTPASS SELF (INSTRUMENTS) IMPLANT
SLEEVE REMOTE CONTROL 5X12 (DRAPES) IMPLANT
SLING ARM LRG DEEP (SOFTGOODS) ×1 IMPLANT
SLING ULTRA II LG (MISCELLANEOUS) ×1 IMPLANT
SPONGE T-LAP 18X18 ~~LOC~~+RFID (SPONGE) ×1 IMPLANT
STAPLER SKIN PROX 35W (STAPLE) ×1 IMPLANT
STRAP SAFETY 5IN WIDE (MISCELLANEOUS) ×1 IMPLANT
SUT ETHIBOND 0 MO6 C/R (SUTURE) ×1 IMPLANT
SUT ULTRABRAID 2 COBRAID 38 (SUTURE) IMPLANT
SUT VIC AB 2-0 CT1 27 (SUTURE) ×2
SUT VIC AB 2-0 CT1 TAPERPNT 27 (SUTURE) ×2 IMPLANT
TAPE MICROFOAM 4IN (TAPE) ×1 IMPLANT
TRAP FLUID SMOKE EVACUATOR (MISCELLANEOUS) ×1 IMPLANT
TUBE SET DOUBLEFLO INFLOW (TUBING) ×1 IMPLANT
TUBING CONNECTING 10 (TUBING) ×1 IMPLANT
WAND WEREWOLF FLOW 90D (MISCELLANEOUS) ×1 IMPLANT
WATER STERILE IRR 500ML POUR (IV SOLUTION) ×1 IMPLANT

## 2022-12-03 NOTE — H&P (Signed)
History of Present Illness:  Sara Munoz is a 57 y.o. female who has severe left shoulder pain and has failed conservative treatment and past operative treatments. Patient has had significant issues with this left shoulder in the past. Surgeries performed by Dr. 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 After discussion with Dr. Joice Lofts, it was discussed that it be best to have a shoulder scope performed to evaluate any further issues in the left shoulder. Patient states that she has not had any other trauma. States her pain in her left shoulder still persists, she has reduced range of motion and pain with any flexion or abduction of the shoulder that affects her daily life. She has requested operative intervention for relief of her pain and symptoms. Patient is not a diabetic, however has significant cardiac history for which she was not previously cleared for surgery in March of 2024 but received clearance on 11/01/2022 by Dr. Azucena Cecil.  Social Hx: Patient is a current smoker, about half pack a day. Denies any illicit drug use.    Allergies:  Cephalexin Hives  Chlorpheniramine Maleate Unknown  Cyclobenzaprine Hives and Swelling  Reaction: Facial/lip swelling  Ketorolac Tromethamine Other (Facial/tongue swelling)  Levofloxacin Hives  Phenylephrine Hcl Unknown  Sertraline Hcl Other (Tongue swelling)  Tramadol Hives, Other (Lip swelling)   Current Outpatient Medications: albuterol 90 mcg/actuation inhaler Inhale into the lungs.  amLODIPine (NORVASC) 5 MG tablet Take 5 mg by mouth once daily  aspirin 81 MG EC tablet Take by mouth.  atorvastatin (LIPITOR) 20 MG tablet Take by mouth  budesonide-formoterol (SYMBICORT) 160-4.5 mcg/actuation inhaler Inhale into the lungs.  gabapentin (NEURONTIN) 600 MG tablet Take 600 mg by mouth 3 (three) times daily  lactulose (ENULOSE) 10 gram/15 mL oral solution Take 30 mLs by mouth 4  (four) times daily  LINZESS 290 mcg capsule Take 290 mcg by mouth once daily  lisinopril (PRINIVIL,ZESTRIL) 40 MG tablet Take 40 mg by mouth  metoprolol tartrate (LOPRESSOR) 25 MG tablet Take by mouth.  mometasone-formoterol (DULERA) 100-5 mcg/actuation inhaler Inhale 2 inhalations into the lungs 2 (two) times daily  naloxone 0.4 mg/0.4 mL AtIn Inject as directed  nitroGLYcerin (NITROSTAT) 0.4 MG SL tablet Place 1 tablet (0.4 mg total) under the tongue every 5 (five) minutes as needed for Chest pain For up to 3 doses 25 tablet 0  omeprazole (PRILOSEC) 40 MG DR capsule Take 40 mg by mouth once daily  ondansetron (ZOFRAN) 8 MG tablet Take 8 mg by mouth every 8 (eight) hours as needed  oxyCODONE (ROXICODONE) 15 MG immediate release tablet Take 15 mg by mouth every 4 (four) hours as needed for Pain  SPIRIVA RESPIMAT 2.5 mcg/actuation inhalation spray TAKE 1 PUFF BY MOUTH EVERY DAY  sucralfate (CARAFATE) 100 mg/mL suspension Take 1 g by mouth 4 (four) times daily  tiotropium (SPIRIVA) 18 mcg inhalation capsule Place 18 mcg into inhaler and inhale once daily   Past Medical History:  Anemia  Chronic constipation  Chronic hip pain  Chronic nausea  Chronic pain syndrome  Chronic radicular lumbar pain  COPD (chronic obstructive pulmonary disease) (CMS/HHS-HCC)  Coronary artery disease  Costochondritis  GERD (gastroesophageal reflux disease)  Hyperlipidemia  Hypertension  Liver disease  MI (myocardial infarction) (CMS/HHS-HCC)  Narcotic abuse (CMS/HHS-HCC)  Narcotic dependence (CMS/HHS-HCC)  Neurogenic pain  Osteoarthritis  Polysubstance (excluding opioids) dependence (CMS/HHS-HCC)  PVD (peripheral vascular disease) (CMS-HCC)  Sleep apnea  TIA (transient ischemic attack)  Past Surgical History:  Extensive arthroscopic debridement, arthroscopic subacromial decompression, mini-open rotator cuff repair using a [employer] Regeneten patch, and mini-open biceps tenodesis, left shoulder.  Left 05/28/2022 (Dr. Joice Lofts)  CARDIAC CATHETERIZATION  TIBULA  UNLISTED PROCEDURE FEMUR/KNEE LEFT    Physical Exam: Ht:162.6 cm (5\' 4" ) Wt:87.4 kg (192 lb 9.6 oz) BMI: Body mass index is 33.06 kg/m.  General/Constitutional: No apparent distress: well-nourished and well developed. Eyes: Pupils equal, round with synchronous movement. Lymphatic: No palpable adenopathy. Respiratory: Patient has good chest wall movement with inspiration and expiration. Upon auscultation, patient does not have any wheezes, rhonchi or rails appreciated bilaterally. No crackles appreciated at the bases of the lungs Cardiovascular: Patient has a regular rate and rhythm without any appreciable murmurs, heaves, rubs or gallops. Distal posterior tibial pulses appreciated (2+). No edema, swelling or tenderness, except as noted in detailed exam. Integumentary: No impressive skin lesions present, except as noted in detailed exam. Neuro/Psych: Normal mood and affect, oriented to person, place and time. Patient reports having numbness in her left thumb and index finger, consistent with having history and issues with carpal tunnel. Denies having any other numbness or tingling down her left arm Musculoskeletal:   Left Shoulder exam: Upon inspection of the patient's left shoulder does not appear to be any gross deformities or tenting of the skin. There is well-healed surgical incisions on the anterior and posterior aspect of the patient's shoulder from previous surgeries performed by Dr. Joice Lofts. Noticeable Popey deformity appreciated on exam.  Shoulder contour: Normal Tenderness: Subacromial tenderness. Impingement test: Positive for elicited pain Apprehension sign: negative Crepitance: negative Atrophy: mild atrophy. Limited strength with both internal and external rotation of the shoulder against resistance. Sulcus sign: negative Liftoff test: Performed with difficulty Empty can test: Positive Range of Motion: EXT/FLEX:  30/100 actively, 140 passively (with increased pain)  ADD/ABD: Patient has good adduction, only able to abduct to just past 90 degrees actively, reports having large amount of pain when trying to increase this passively  IR/ER: full with difficulty of lift off test/ 40 (fair strength)   Patient has good capillary refill and radial pulses appreciated (2+)  Imaging: None ordered today  Assessment:  1.  Continued pain status post rotator cuff repair left shoulder.  Plan: The treatment options were discussed with the patient. In addition, patient educational materials were provided regarding the diagnosis and treatment options. The patient is quite frustrated by her symptoms and functional limitations, and is ready to consider more aggressive treatment options. Therefore, especially given Dr. Bartholome Bill ultrasound findings which suggest that there may be detachment of the Regeneten patch, I have recommended a surgical procedure, specifically a left shoulder arthroscopy with debridement, decompression, and possible repair of a recurrent rotator cuff tear. The procedure was discussed with the patient, as were the potential risks (including bleeding, infection, nerve and/or blood vessel injury, persistent or recurrent pain, failure of the repair, progression of arthritis, need for further surgery, blood clots, strokes, heart attacks and/or arhythmias, pneumonia, etc.) and benefits. The patient states her understanding and wishes to proceed. All of the patient's questions and concerns were answered. She can call any time with further concerns. She will follow up post-surgery, routine. Meanwhile, she will continue with her normal daily activities and home exercises, but to avoid offending activities. She may continue her present medication regimen as prescribed by her pain clinic provider.   Pre, intra and post op interventions were discussed. Patient has good understanding   Patient has extensive cardiac  history for which she was initially not cleared in March. Has since gone through some testing was cleared by her cardiologist in May.   Patient is a smoker, urged to cut down as much as possible before surgery and not smoke or use any nicotine based products the day before  Medication Reconciliation was performed. Discussed cessation of Aspirin, lisinopril, vitamins and supplements.    H&P reviewed and patient re-examined. No changes.

## 2022-12-03 NOTE — Transfer of Care (Addendum)
Immediate Anesthesia Transfer of Care Note  Patient: Sara Munoz  Procedure(s) Performed: SHOULDER Extensive ARTHROSCOPY WITH DEBRIDEMENT, removal of Retained Foreign Body (Left: Shoulder)  Patient Location: PACU  Anesthesia Type:General  Level of Consciousness: awake, alert , and oriented  Airway & Oxygen Therapy: Patient Spontanous Breathing  Post-op Assessment: Report given to RN and Post -op Vital signs reviewed and stable  Post vital signs: Reviewed and stable 2L [place] in PACU  Last Vitals:  Vitals Value Taken Time  BP 132/76 12/03/22 1147  Temp 97.0   Pulse 85 12/03/22 1151  Resp 17 12/03/22 1151  SpO2 99 % 12/03/22 1151  Vitals shown include unvalidated device data.  Last Pain:  Vitals:   12/03/22 0840  TempSrc: Temporal  PainSc: 7          Complications: No notable events documented.

## 2022-12-03 NOTE — Anesthesia Preprocedure Evaluation (Addendum)
Anesthesia Evaluation  Patient identified by MRN, date of birth, ID band Patient awake    Reviewed: Allergy & Precautions, NPO status , Patient's Chart, lab work & pertinent test results  History of Anesthesia Complications Negative for: history of anesthetic complications  Airway Mallampati: III  TM Distance: >3 FB Neck ROM: full    Dental  (+) Chipped   Pulmonary asthma , sleep apnea , COPD,  COPD inhaler, Current Smoker and Patient abstained from smoking.   Pulmonary exam normal        Cardiovascular hypertension, On Medications and On Home Beta Blockers + CAD, + Past MI and + Peripheral Vascular Disease  Normal cardiovascular exam   Most recent TTE was performed on 10/21/2022 revealing a normal left ventricular systolic function with an EF of 60 to 65%.  There were no regional wall motion abnormalities.  There was mild LVH.  Right ventricular size and function normal.  There was mild mitral valve regurgitation.  All transvalvular gradients were noted to be normal providing no evidence suggestive of valvular stenosis.  Aorta normal in size with no evidence of aneurysmal dilatation.    Coronary CTA was performed on 10/28/2022 revealing an elevated coronary calcium score of 1092.  This was in the 99th percentile for age, sex, and race matched control.  Patient with mild (25-49%) stenosis in the mid LM, proximal LAD, and proximal LCx territories.  Additionally there was minimal (less than 25%) stenosis in the RCA territory.  Preventative therapy and risk factor modification was recommended.    Neuro/Psych  Headaches PSYCHIATRIC DISORDERS Anxiety Depression    TIA Neuromuscular disease (cervical and lumbar DDD, on chronic opiods)    GI/Hepatic ,GERD  Controlled,,  Endo/Other  negative endocrine ROS    Renal/GU      Musculoskeletal   Abdominal   Peds  Hematology  (+) Blood dyscrasia, anemia   Anesthesia Other  Findings Past Medical History: No date: Anemia No date: Anxiety No date: Aortic atherosclerosis (HCC) No date: Asthma 08/02/2015: Atypical chest pain No date: Carotid artery disease (HCC) No date: Chronic back pain No date: Chronic nausea 02/27/2016: Chronic, continuous use of opioids     Comment:  a.) has naloxone Rx available No date: COPD (chronic obstructive pulmonary disease) (HCC) No date: Coronary artery disease     Comment:  a.) LHC 2011: no sig CAD (report unavailable); b.) LHC               07/07/2014: 10-20% mLM, 25% LAD, 30-40% LCx, 20-30% RCA               -- med mgmt; c.) LHC 02/22/2016: 25% LM, 25% pLCx --> med              mgmt; d.) MPI 07/10/2017: no ischemia; e.) MPI               09/01/2019: no ischemia; f.) cCTA 10/28/2022: Ca score               1092 (99th percentile for age/sex/race match control) No date: Current smoker No date: DDD (degenerative disc disease), cervical     Comment:  a.) s/p ACDF C5-C7 and PSIF C5-T1 05/23/2020: DDD (degenerative disc disease), lumbar     Comment:  a.) s/p L5-S1 PLIF No date: GERD (gastroesophageal reflux disease) No date: Hepatic steatosis No date: Hepatitis C virus infection cured after antiviral drug  therapy     Comment:  a.) s/p Tx with standard glecaprevir-pibrentasvir course No date: Hypercholesteremia  No date: Hyperkalemia No date: Hypertension No date: Hypokalemia 01/04/2014: Hypomagnesemia No date: Incomplete tear of left rotator cuff No date: MI (myocardial infarction) (HCC) No date: Migraines 03/03/2014: MSSA (methicillin susceptible Staphylococcus aureus)  septicemia (HCC)     Comment:  a.) TEE (-) for valvular vegitation No date: Nerve pain     Comment:  per patient, in the lower back No date: Osteoarthritis No date: Pneumonia No date: PTSD (post-traumatic stress disorder) No date: Tendinitis of left rotator cuff No date: TIA (transient ischemic attack)  Past Surgical History: 03/30/2018: ANTERIOR  CERVICAL DECOMP/DISCECTOMY FUSION; N/A     Comment:  Procedure: Cervical six-seven Anterior cervical               decompression/discectomy/fusion;  Surgeon: Jadene Pierini, MD;  [facility];  Service: Neurosurgery;                Laterality: N/A; 01/11/2019: ANTERIOR CERVICAL DECOMP/DISCECTOMY FUSION; N/A     Comment:  Procedure: Cervical Five-Six Anterior cervical               discectomy fusion,  Cervical Five to Cervical Seven               anterior instrumented fusion;  Surgeon: Jadene Pierini, MD;  [facility];  Service: Neurosurgery;                Laterality: N/A;  Cervical Five-Six Anterior cervical               discectomy fusion,  Cervical Five to Cervical Seven               anterior instrumented fusion 02/22/2016: CARDIAC CATHETERIZATION; Left     Comment:  Procedure: Left Heart Cath and Coronary Angiography;                Surgeon: Alwyn Pea, MD;  Location: [facility] INVASIVE               CV LAB;  Service: Cardiovascular;  Laterality: Left; No date: CATARACT EXTRACTION W/ INTRAOCULAR LENS IMPLANT; Right 09/19/2016: COLONOSCOPY WITH PROPOFOL; N/A     Comment:  Procedure: COLONOSCOPY WITH PROPOFOL;  Surgeon: [name], MD;  Location: [facility] ENDOSCOPY;  Service: Endoscopy;              Laterality: N/A; No date: DIAGNOSTIC LAPAROSCOPY 09/18/2017: ESOPHAGOGASTRODUODENOSCOPY (EGD) WITH PROPOFOL; N/A     Comment:  Procedure: ESOPHAGOGASTRODUODENOSCOPY (EGD) WITH               PROPOFOL;  Surgeon: [name], MD;  Location: [facility]               ENDOSCOPY;  Service: Gastroenterology;  Laterality: N/A; 08/10/2019: ESOPHAGOGASTRODUODENOSCOPY (EGD) WITH PROPOFOL; N/A     Comment:  Procedure: ESOPHAGOGASTRODUODENOSCOPY (EGD) WITH               PROPOFOL;  Surgeon: [name], MD;  Location: [facility]               ENDOSCOPY;  Service: Endoscopy;  Laterality: N/A; No date: HEMORRHOID SURGERY 07/07/2014: LEFT HEART CATH AND  CORONARY ANGIOGRAPHY; Left 2011: LEFT HEART CATH AND CORONARY ANGIOGRAPHY; Left 11/05/2022: LUMBAR WOUND DEBRIDEMENT; N/A     Comment:  Procedure: Thoracic  one spinous process removal and               wound revision;  Surgeon: Jadene Pierini, MD;                [facility];  Service: Neurosurgery;  Laterality:               N/A; No date: NASAL SINUS SURGERY No date: ORIF FEMUR FRACTURE; Left No date: ORIF TIBIA & FIBULA FRACTURES; Right No date: OVARIAN CYST SURGERY 05/14/2021: POSTERIOR CERVICAL FUSION/FORAMINOTOMY; N/A     Comment:  Procedure: Cervical five, Cervical six               Laminectomy,foraminotomy with Cervical five-six Cervical               six-seven Cervical seven-Thoracic one Posterior               instrumented fusion;  Surgeon: Jadene Pierini, MD;                [facility];  Service: Neurosurgery;  Laterality:               N/A; 05/28/2022: SHOULDER ARTHROSCOPY WITH SUBACROMIAL DECOMPRESSION,  ROTATOR CUFF REPAIR AND BICEP TENDON REPAIR; Left     Comment:  Procedure: SHOULDER ARTHROSCOPY WITH DEBRIDEMENT,               DECOMPRESSION, ROTATOR CUFF REPAIR AND BICEP TENDON               REPAIR;  Surgeon: Christena Flake, MD;  Location: [facility] ORS;              Service: Orthopedics;  Laterality: Left; 05/23/2020: TRANSFORAMINAL LUMBAR INTERBODY FUSION W/ MIS 1 LEVEL;  Right     Comment:  Procedure: Right Lumbar Five Sacral One Minimally               invasive transforaminal lumbar interbody fusion;                Surgeon: Jadene Pierini, MD;  [facility];                Service: Neurosurgery;  Laterality: Right;  Right Lumbar               Five Sacral One Minimally invasive transforaminal lumbar               interbody fusion  BMI    Body Mass Index: 32.61 kg/m      Reproductive/Obstetrics negative OB ROS                             Anesthesia Physical Anesthesia Plan  ASA: 3  Anesthesia Plan: General ETT    Post-op Pain Management: Toradol IV (intra-op)* and Ofirmev IV (intra-op)*   Induction: Intravenous  PONV Risk Score and Plan: 3 and Ondansetron, Dexamethasone, Midazolam and Treatment may vary due to age or medical condition  Airway Management Planned: Oral ETT  Additional Equipment:   Intra-op Plan:   Post-operative Plan: Extubation in OR  Informed Consent: I have reviewed the patients History and Physical, chart, labs and discussed the procedure including the risks, benefits and alternatives for the proposed anesthesia with the patient or authorized representative who has indicated his/her understanding and acceptance.     Dental Advisory Given  Plan Discussed with: Anesthesiologist, CRNA and Surgeon  Anesthesia Plan Comments: (Patient consented for risks  of anesthesia including but not limited to:  - adverse reactions to medications - damage to eyes, teeth, lips or other oral mucosa - nerve damage due to positioning  - sore throat or hoarseness - Damage to heart, brain, nerves, lungs, other parts of body or loss of life  Patient voiced understanding.  Patient declined interscalene block as she is appropriately concerned about her respiratory status postop. )        Anesthesia Quick Evaluation

## 2022-12-03 NOTE — Discharge Instructions (Addendum)
Orthopedic discharge instructions: Keep dressing dry and intact.  May shower after dressing changed on post-op day #4 (Saturday).  Cover staples with Band-Aids after drying off. Apply ice frequently to shoulder. Take ibuprofen 600-800 mg TID with meals for 2-3 days, then as necessary. Take oxycodone as prescribed when needed.  May supplement with ES Tylenol if necessary. Keep shoulder immobilizer on at all times except may remove for bathing purposes. Follow-up in 10-14 days or as scheduled.  AMBULATORY SURGERY  DISCHARGE INSTRUCTIONS   The drugs that you were given will stay in your system until tomorrow so for the next 24 hours you should not:  Drive an automobile Make any legal decisions Drink any alcoholic beverage   You may resume regular meals tomorrow.  Today it is better to start with liquids and gradually work up to solid foods.  You may eat anything you prefer, but it is better to start with liquids, then soup and crackers, and gradually work up to solid foods.   Please notify your doctor immediately if you have any unusual bleeding, trouble breathing, redness and pain at the surgery site, drainage, fever, or pain not relieved by medication.    Additional Instructions: PLEASE LEAVE EXPAREL (TEAL) ARMBAND ON FOR 4 DAYS   Please contact your physician with any problems or Same Day Surgery at [phone], Monday through Friday 6 am to 4 pm, or  at [facility] number at [phone].

## 2022-12-03 NOTE — Anesthesia Procedure Notes (Signed)
Procedure Name: Intubation Date/Time: 12/03/2022 10:37 AM  Performed by: Fifi Schindler, [place], CRNAPre-anesthesia Checklist: Patient identified, Patient being monitored, Timeout performed, Emergency Drugs available and Suction available Patient Re-evaluated:Patient Re-evaluated prior to induction Oxygen Delivery Method: Circle system utilized Preoxygenation: Pre-oxygenation with 100% oxygen Induction Type: IV induction Ventilation: Mask ventilation without difficulty Laryngoscope Size: McGraph and 3 Grade View: Grade I Tube type: Oral Tube size: 7.0 mm Number of attempts: 1 Airway Equipment and Method: Stylet Placement Confirmation: ETT inserted through vocal cords under direct vision, positive ETCO2 and breath sounds checked- equal and bilateral Secured at: 22 cm Tube secured with: Tape Dental Injury: Teeth and Oropharynx as per pre-operative assessment

## 2022-12-03 NOTE — Op Note (Signed)
12/03/2022  11:55 AM  Patient:   Sara Munoz  Pre-Op Diagnosis:   Persistent pain secondary to scar tissue versus tendinopathy status post rotator cuff repair left shoulder.  Post-Op Diagnosis:   Same with retained foreign body, left shoulder.  Procedure:   Extensive arthroscopic debridement with removal of retained foreign body, left shoulder.  Anesthesia:   GET  Surgeon:   Maryagnes Amos, MD  Assistant:   Mordecai Rasmussen, PA-S  Findings:   As above. There was an area of degenerative fraying involving the superior portion of the labrum without frank detachment from the glenoid rim. There also was mild fraying of the superior insertional fibers of the subscapularis tendon without compromise of the footprint. The rotator cuff otherwise was in excellent condition when viewed from the glenohumeral joint. On the bursal surface, there was moderate fraying of the supraspinatus and infraspinatus tendons without significant partial full-thickness tearing. There also was the remnants of the previously placed patch which appeared to have loosened and migrated into the subcoracoid space before incorporating.  Several of the staples were visible and ultimately removed. The articular surfaces of the glenoid humeral head both were in excellent condition.  Complications:   None  Fluids:   500 cc  Estimated blood loss:   15 cc  Tourniquet time:   None  Drains:   None  Closure:   Staples      Brief clinical note:   The patient is a 57 year old female who is now 6 months status post a left shoulder arthroscopy with debridement, decompression, repair of a bursal sided partial-thickness rotator cuff tear using a [employer] Regeneten patch, and biceps tenodesis. Despite extensive physical therapy and postoperative care, the patient has continued to have pain in the left shoulder. An ultrasound evaluation of the rotator cuff demonstrated that tendon remained intact and that there was no evidence of any  recurrent tearing. However, it appeared that the patch may have dislodged and migrated anteriorly, possibly contributing to her symptoms. Therefore, the patient presents at this time for definitive management of these shoulder symptoms.  Procedure:   The patient was brought into the operating room and lain in the supine position. The patient then underwent general endotracheal intubation and anesthesia before being repositioned in the beach chair position using the beach chair positioner. The left shoulder and upper extremity were prepped with ChloraPrep solution before being draped sterilely. Preoperative antibiotics were administered. A timeout was performed to confirm the proper surgical site before the expected portal sites and incision site were injected with a solution of 10 cc of Exparel and 20 cc of 0.5% Sensorcaine with epinephrine.   A posterior portal was created and the glenohumeral joint thoroughly inspected with the findings as described above. An anterior portal was created using an outside-in technique. The labrum and rotator cuff were further probed, again confirming the above-noted findings. The areas of labral fraying were debrided back to stable margins using the full-radius resector, as was the area of fraying involving the superior insertional fibers of the subscapularis tendon. The ArthroCare wand was inserted and used to obtain hemostasis as well as to "anneal" the labrum superiorly and anteriorly. The instruments were removed from the joint after suctioning the excess fluid.  The camera was repositioned through the posterior portal into the subacromial space. A separate lateral portal was created using an outside-in technique. The 3.5 mm full-radius resector was introduced and used to perform a subtotal bursectomy. The ArthroCare wand was then inserted  and used to remove the periosteal and scar tissue off the undersurface of the anterior third of the acromion as well as to debride a  band of tissue that ran from anterior to posterior more medially that may have been contributing to irritation of the bursal surface of the rotator cuff. The ArthroCare wand also was used to smooth out the frayed bursal surface surface of the the supraspinatus and infraspinatus tendons. More anteriorly, the remnant of the Regeneten patch was identified along with several pieces of retained soft tissue staples. These areas were debrided out using the full-radius resector. Hemostasis was achieved using the ArthroCare wand. The instruments were then removed from the subacromial space after suctioning the excess fluid.  The portal sites were closed using staples. A sterile bulky dressing was applied to the shoulder before the arm was placed into a shoulder sling. The patient was then awakened, extubated, and returned to the recovery room in satisfactory condition after tolerating the procedure well.

## 2022-12-03 NOTE — Anesthesia Postprocedure Evaluation (Signed)
Anesthesia Post Note  Patient: Sara Munoz  Procedure(s) Performed: SHOULDER Extensive ARTHROSCOPY WITH DEBRIDEMENT, removal of Retained Foreign Body (Left: Shoulder)  Patient location during evaluation: PACU Anesthesia Type: General Level of consciousness: awake and alert Pain management: pain level controlled Vital Signs Assessment: post-procedure vital signs reviewed and stable Respiratory status: spontaneous breathing, nonlabored ventilation, respiratory function stable and patient connected to nasal cannula oxygen Cardiovascular status: blood pressure returned to baseline and stable Postop Assessment: no apparent nausea or vomiting Anesthetic complications: no   No notable events documented.   Last Vitals:  Vitals:   12/03/22 1220 12/03/22 1225  BP:    Pulse: 78 75  Resp: 15 12  Temp: 36.6 C   SpO2: 93% 97%    Last Pain:  Vitals:   12/03/22 1220  TempSrc:   PainSc: 0-No pain                 Louie Boston

## 2022-12-04 ENCOUNTER — Encounter: Payer: Self-pay | Admitting: Surgery

## 2022-12-04 DIAGNOSIS — Z9889 Other specified postprocedural states: Secondary | ICD-10-CM | POA: Insufficient documentation

## 2022-12-04 DIAGNOSIS — M795 Residual foreign body in soft tissue: Secondary | ICD-10-CM | POA: Insufficient documentation

## 2022-12-05 ENCOUNTER — Other Ambulatory Visit: Payer: Self-pay | Admitting: Family Medicine

## 2022-12-05 DIAGNOSIS — I10 Essential (primary) hypertension: Secondary | ICD-10-CM

## 2022-12-05 MED ORDER — AMLODIPINE BESYLATE 5 MG PO TABS
5.0000 mg | ORAL_TABLET | Freq: Every evening | ORAL | 0 refills | Status: DC
Start: 2022-12-05 — End: 2023-01-16

## 2022-12-05 MED ORDER — METOPROLOL TARTRATE 25 MG PO TABS
25.0000 mg | ORAL_TABLET | Freq: Two times a day (BID) | ORAL | 0 refills | Status: DC
Start: 2022-12-05 — End: 2023-01-06

## 2022-12-05 MED ORDER — LISINOPRIL 20 MG PO TABS
40.0000 mg | ORAL_TABLET | Freq: Every day | ORAL | 0 refills | Status: DC
Start: 2022-12-05 — End: 2023-01-16

## 2022-12-05 NOTE — Telephone Encounter (Signed)
Medication Refill - Medication: amLODipine (NORVASC) 5 MG tablet , lisinopril (ZESTRIL) 20 MG tablet , metoprolol tartrate (LOPRESSOR) 25 MG tablet   Has the patient contacted their pharmacy? No.   Preferred Pharmacy (with phone number or street name):  [id number] - [place], [place] - [address] Phone: [phone]  Fax: [phone]      Has the patient been seen for an appointment in the last year OR does the patient have an upcoming appointment? Yes.    The patient states she is completely out of all these meds. Please assist patient further

## 2022-12-05 NOTE — Telephone Encounter (Signed)
Requested Prescriptions  Pending Prescriptions Disp Refills   amLODipine (NORVASC) 5 MG tablet 90 tablet 0    Sig: Take 1 tablet (5 mg total) by mouth at bedtime.     Cardiovascular: Calcium Channel Blockers 2 Failed - 12/05/2022 12:02 PM      Failed - Last BP in normal range    BP Readings from Last 1 Encounters:  12/03/22 (!) 154/97         Passed - Last Heart Rate in normal range    Pulse Readings from Last 1 Encounters:  12/03/22 79         Passed - Valid encounter within last 6 months    Recent Outpatient Visits           2 months ago Atopic dermatitis of both hands   [place] [facility] Merita Norton T, FNP   3 months ago Headache disorder   [place] [facility] [name], [place], MD   8 months ago Essential hypertension   [place] [facility] [name], [place] [employer], MD   8 months ago Centrilobular emphysema [facility])   [place] [facility] [name], [place], MD   1 year ago Hypercholesterolemia   [place] [facility] [name], Jodell Cipro, [place]       Future Appointments             In 2 weeks [name], Tawanna Cooler, MD [facility], [facility]   In 2 months Drema Dallas, DO [facility]   In 5 months Debbe Odea, MD [facility]             lisinopril (ZESTRIL) 20 MG tablet 180 tablet 0    Sig: Take 2 tablets (40 mg total) by mouth daily with lunch.     Cardiovascular:  ACE Inhibitors Failed - 12/05/2022 12:02 PM      Failed - Last BP in normal range    BP Readings from Last 1 Encounters:  12/03/22 (!) 154/97         Passed - Cr in normal range and within 180 days    Creatinine, Ser  Date Value Ref Range Status  10/30/2022 0.98 0.44 - 1.00 mg/dL Final         Passed - K in normal range and within 180 days    Potassium  Date Value Ref Range Status  10/30/2022 4.2 3.5 - 5.1 mmol/L  Final         Passed - Patient is not pregnant      Passed - Valid encounter within last 6 months    Recent Outpatient Visits           2 months ago Atopic dermatitis of both hands   [place] [facility] Merita Norton T, FNP   3 months ago Headache disorder   [place] [facility] [name], [place], MD   8 months ago Essential hypertension   [place] [facility] [name], [place], MD   8 months ago Centrilobular emphysema [facility])   [place] [facility] [name], [place], MD   1 year ago Hypercholesterolemia   [facility] [name], Jodell Cipro, [place]       Future Appointments             In 2 weeks [name], Tawanna Cooler, MD [facility], [facility]   In 2 months Drema Dallas, DO [facility]  In 5 months Ag[place]-Etang, Arlys John, MD [facility] [employer]             metoprolol tartrate (LOPRESSOR) 25 MG tablet 180 tablet 0    Sig: Take 1 tablet (25 mg total) by mouth 2 (two) times daily.     Cardiovascular:  Beta Blockers Failed - 12/05/2022 12:02 PM      Failed - Last BP in normal range    BP Readings from Last 1 Encounters:  12/03/22 (!) 154/97         Passed - Last Heart Rate in normal range    Pulse Readings from Last 1 Encounters:  12/03/22 79         Passed - Valid encounter within last 6 months    Recent Outpatient Visits           2 months ago Atopic dermatitis of both hands   [place] [facility] Merita Norton T, FNP   3 months ago Headache disorder   [place] [facility] [name], [place], MD   8 months ago Essential hypertension   [place] [facility] [name], [place], MD   8 months ago Centrilobular emphysema [facility])   [place] [facility] [name], [place], MD   1 year ago  Hypercholesterolemia   [place] [facility] [name], Jodell Cipro, [place]       Future Appointments             In 2 weeks [name], Tawanna Cooler, MD [facility], [facility]   In 2 months Drema Dallas, DO [facility]   In 5 months Debbe Odea, MD [facility]

## 2022-12-11 ENCOUNTER — Other Ambulatory Visit: Payer: Self-pay | Admitting: Family Medicine

## 2022-12-11 DIAGNOSIS — R110 Nausea: Secondary | ICD-10-CM

## 2022-12-11 NOTE — Telephone Encounter (Signed)
Requested medication (s) are due for refill today: Yes  Requested medication (s) are on the active medication list: yes    Last refill: 09/12/22  #90  1 refill  Future visit scheduled yes  12/23/22  Notes to clinic:Not delegated, please review. Thank you.  Requested Prescriptions  Pending Prescriptions Disp Refills   ondansetron (ZOFRAN) 8 MG tablet [Pharmacy Med Name: ONDANSETRON HCL 8 MG TABLET] 90 tablet 1    Sig: TAKE 1 TABLET BY MOUTH EVERY 8 HOURS AS NEEDED FOR NAUSEA OR VOMITING.     Not Delegated - Gastroenterology: Antiemetics - ondansetron Failed - 12/11/2022  2:20 PM      Failed - This refill cannot be delegated      Passed - AST in normal range and within 360 days    AST  Date Value Ref Range Status  10/30/2022 20 15 - 41 U/L Final         Passed - ALT in normal range and within 360 days    ALT  Date Value Ref Range Status  10/30/2022 15 0 - 44 U/L Final         Passed - Valid encounter within last 6 months    Recent Outpatient Visits           3 months ago Atopic dermatitis of both hands   [place] [facility] Merita Norton T, FNP   4 months ago Headache disorder   [place] [facility] [name], [place], MD   8 months ago Essential hypertension   [place] [facility] [name], [place], MD   9 months ago Centrilobular emphysema [facility])   [place] [facility] [name], [place], MD   1 year ago Hypercholesterolemia   [place] [facility] [name], Jodell Cipro, [name]       Future Appointments             In 1 week [name], Tawanna Cooler, MD [facility]   In 1 month Drema Dallas, DO [facility]   In 4 months Agbor-Etang, Arlys John, MD [facility]

## 2022-12-23 ENCOUNTER — Ambulatory Visit: Payer: 59 | Admitting: Family Medicine

## 2022-12-23 NOTE — Progress Notes (Deleted)
Established patient visit   Patient: Sara Munoz   DOB: 07/19/1965   57 y.o. Female  MRN: [id number] Visit Date: 12/23/2022  Today's healthcare provider: Ronnald Ramp, MD   No chief complaint on file.  Subjective      Hypertension, follow-up  BP Readings from Last 3 Encounters:  12/03/22 (!) 154/97  11/05/22 (!) 140/82  11/01/22 136/72   Wt Readings from Last 3 Encounters:  12/03/22 190 lb (86.2 kg)  11/28/22 190 lb 0.6 oz (86.2 kg)  11/05/22 190 lb (86.2 kg)     She was last seen for hypertension {NUMBERS 1-12:18279} {days/wks/mos/yrs:310907} ago.  BP at that visit was ***. Management since that visit includes ***.  She reports {excellent/good/fair/poor:19665} compliance with treatment. She {is/is not:9024} having side effects. {document side effects if present:1} She is following a {diet:21022986} diet. She {is/is not:9024} exercising. She {does/does not:200015} smoke.  Use of agents associated with hypertension: {bp agents assoc with hypertension:511::"none"}.   Outside blood pressures are {***enter patient reported home BP readings, or 'not being checked':1}. Symptoms: {Yes/No:20286} chest pain {Yes/No:20286} chest pressure  {Yes/No:20286} palpitations {Yes/No:20286} syncope  {Yes/No:20286} dyspnea {Yes/No:20286} orthopnea  {Yes/No:20286} paroxysmal nocturnal dyspnea {Yes/No:20286} lower extremity edema   Pertinent labs Lab Results  Component Value Date   CHOL 268 (H) 04/03/2022   HDL 37 (L) 04/03/2022   LDLCALC 179 (H) 04/03/2022   TRIG 268 (H) 04/03/2022   CHOLHDL 7.2 (H) 04/03/2022   Lab Results  Component Value Date   NA 134 (L) 10/30/2022   K 4.2 10/30/2022   CREATININE 0.98 10/30/2022   GFRNONAA >60 10/30/2022   GLUCOSE 90 10/30/2022   TSH 2.910 04/03/2022     The ASCVD Risk score (Arnett DK, et al., 2019) failed to calculate for the following reasons:   The patient has a prior MI or stroke  diagnosis  ---------------------------------------------------------------------------------------------------   Medications: Outpatient Medications Prior to Visit  Medication Sig   albuterol (PROVENTIL) (2.5 MG/3ML) 0.083% nebulizer solution Take 3 mLs (2.5 mg total) by nebulization every 6 (six) hours as needed for wheezing or shortness of breath.   albuterol (VENTOLIN HFA) 108 (90 Base) MCG/ACT inhaler Inhale 1-2 puffs into the lungs every 6 (six) hours as needed. INHALE TWO PUFFS BY MOUTH INTO LUNGS EVERY 6 HOURS AS NEEDED FOR SHORTNESS OF BREATH OR wheezing   amLODipine (NORVASC) 5 MG tablet Take 1 tablet (5 mg total) by mouth at bedtime.   aspirin EC 81 MG tablet Take 81 mg by mouth daily. Swallow whole.   atorvastatin (LIPITOR) 40 MG tablet Take 1 tablet (40 mg total) by mouth every evening.   docusate sodium (COLACE) 100 MG capsule Take 100 mg by mouth 2 (two) times daily.   ELDERBERRY PO Take 1 tablet by mouth daily at 6 (six) AM.   gabapentin (NEURONTIN) 600 MG tablet Take 600 mg by mouth 4 (four) times daily.   lactulose (CHRONULAC) 10 GM/15ML solution Take 30 mLs by mouth in the morning, at noon, in the evening, and at bedtime.   LINZESS 290 MCG CAPS capsule Take 290 mcg by mouth daily as needed (constipation).   lisinopril (ZESTRIL) 20 MG tablet Take 2 tablets (40 mg total) by mouth daily with lunch.   metoprolol tartrate (LOPRESSOR) 25 MG tablet Take 1 tablet (25 mg total) by mouth 2 (two) times daily.   mometasone (ELOCON) 0.1 % cream Apply 1 Application topically daily as needed (hand rash).   mometasone-formoterol (DULERA) 100-5 MCG/ACT AERO Inhale  2 puffs into the lungs every morning.   Multiple Vitamins-Minerals (HAIR SKIN & NAILS PO) Take 1 tablet by mouth daily at 6 (six) AM.   naloxone (NARCAN) 4 MG/0.1ML LIQD nasal spray kit Place 1 spray into the nose as needed (opioid overdose).   Omega-3 Fatty Acids (OMEGA 3 PO) Take 1 capsule by mouth daily at 6 (six) AM.    omeprazole (PRILOSEC) 40 MG capsule TAKE 1 CAPSULE BY MOUTH TWICE A DAY   ondansetron (ZOFRAN) 8 MG tablet TAKE 1 TABLET BY MOUTH EVERY 8 HOURS AS NEEDED FOR NAUSEA OR VOMITING.   oxyCODONE (ROXICODONE) 15 MG immediate release tablet Take 15 mg by mouth every 4 (four) hours as needed for pain.   oxyCODONE (ROXICODONE) 5 MG immediate release tablet Take 1-2 tablets (5-10 mg total) by mouth every 4 (four) hours as needed for breakthrough pain.   sucralfate (CARAFATE) 1 GM/10ML suspension TAKE 10 MLS (1 G TOTAL) BY MOUTH 4 (FOUR) TIMES DAILY.   Tiotropium Bromide Monohydrate (SPIRIVA RESPIMAT) 2.5 MCG/ACT AERS INHALE 1 PUFF BY MOUTH INTO LUNGS ONCE DAILY   TURMERIC PO Take 1 tablet by mouth daily at 6 (six) AM.   No facility-administered medications prior to visit.    Review of Systems  {Labs  Heme  Chem  Endocrine  Serology  Results Review (optional):23779}   Objective    LMP 12/16/2017 (Approximate)  {Show previous vital signs (optional):23777}  Physical Exam  ***  No results found for any visits on 12/23/22.  Assessment & Plan     Problem List Items Addressed This Visit   None    No follow-ups on file.         The entirety of the information documented in the History of Present Illness, Review of Systems and Physical Exam were personally obtained by me. Portions of this information were initially documented by *** . I, Ronnald Ramp, MD have reviewed the documentation above for thoroughness and accuracy.     Ronnald Ramp, MD  [facility] [phone] (phone) [phone] (fax)  [facility]

## 2022-12-30 ENCOUNTER — Ambulatory Visit: Payer: 59 | Admitting: Family Medicine

## 2023-01-01 ENCOUNTER — Other Ambulatory Visit: Payer: 59 | Admitting: Obstetrics and Gynecology

## 2023-01-01 ENCOUNTER — Encounter: Payer: Self-pay | Admitting: Obstetrics and Gynecology

## 2023-01-01 NOTE — Patient Instructions (Signed)
Visit Information  [name] was given information about Medicaid Managed Care team care coordination services as a part of their [place] Medicaid benefit. [name] verbally consented to engagement with the [facility] Managed Care team.   If you are experiencing a medical emergency, please call 911 or report to your local emergency department or urgent care.   If you have a non-emergency medical problem during routine business hours, please contact your provider's office and ask to speak with a nurse.   For questions related to your [place] health plan, please call: [phone]  If you would like to schedule transportation through your [place] plan, please call the following number at least 2 days in advance of your appointment: [phone].   There is no limit to the number of trips during the year between medical appointments, healthcare facilities, or pharmacies. Transportation must be scheduled at least 2 business days before but not more than thirty 30 days before of your appointment.  Call the Behavioral Health Crisis Line at [phone], at any time, 24 hours a day, 7 days a week. If you are in danger or need immediate medical attention call 911.  If you would like help to quit smoking, call 1-800-QUIT-NOW (618 825 0357) OR Espaol: 1-855-Djelo-Ya (1-517-616-0737) o para ms informacin haga clic aqu or Text READY to 106-269 to register via text  [name] - following are the goals we discussed in your visit today:  Timeframe:  Long-Range Goal Priority:  High Start Date:       12/20/20                      Expected End Date:   ongoing     Follow up date:  02/03/23  - schedule appointment for flu shot - schedule appointment for vaccines needed due to my age or health - schedule recommended health tests (blood work, mammogram, colonoscopy, pap test) - schedule and keep appointment for annual check-up   Why is this  important?   Screening tests can find diseases early when they are easier to treat.  Your doctor or nurse will talk with you about which tests are important for you.  Getting shots for common diseases like the flu and shingles will help prevent them.   01/01/23:  PCP 7/15, Pain Management 7/30.  To schedule GI appt Attending PT.   Patient verbalizes understanding of instructions and care plan provided today and agrees to view in MyChart. Active MyChart status and patient understanding of how to access instructions and care plan via MyChart confirmed with patient.     The Managed Medicaid care management team will reach out to the patient again over the next 30 business  days.  The  Patient has been provided with contact information for the Managed Medicaid care management team and has been advised to call with any health related questions or concerns.   Kathi Der RN, BSN [place]  [facility] Care Management Coordinator - Managed Medicaid High Risk [phone]  Following is a copy of your plan of care:  Care Plan : RN Care Manager Plan of Care  Updates made by [name], RN since 01/01/2023 12:00 AM     Problem: Health Promotion or Disease Self-Management (General Plan of Care)      Long-Range Goal: Chronic Disease Management   Start Date: 09/12/2022  Expected End Date: 03/04/2023  Priority: High  Note:   Current Barriers:  Knowledge Deficits related to  plan of care for management of GERD, CAD, HTN, PVD, TIA, migranes, COPD, OSA, tobacco use, osteoarthritis, chronic pain, anxiety  Chronic Disease Management support and education needs related to  GERD, CAD, HTN, PVD, TIA, migranes, COPD, OSA, tobacco use, osteoarthritis, chronic pain, anxiety  01/01/23:  Recovering well from rotator cuff surgery-attending PT.  Still needs to schedule an appt with GI.  Has f/u appts with pain management, PCP scheduled.  BP stable and no change in smoking.  RNCM Clinical Goal(s):   Patient will verbalize understanding of plan for management of GERD, CAD, HTN, PVD, TIA, migranes, COPD, OSA, tobacco use, osteoarthritis, chronic pain, anxiety   as evidenced by patient report verbalize basic understanding of  GERD, CAD, HTN, PVD, TIA, migranes, COPD, OSA, tobacco use, osteoarthritis, chronic pain, anxiety  disease process and self health management plan as evidenced by patient report take all medications exactly as prescribed and will call provider for medication related questions as evidenced by patient report  demonstrate understanding of rationale for each prescribed medication as evidenced by patient report attend all scheduled medical appointments as evidenced by patient report and EMR review demonstrate Ongoing adherence to prescribed treatment plan for  GERD, CAD, HTN, PVD, TIA, migranes, COPD, OSA, tobacco use, osteoarthritis, chronic pain, anxiety as evidenced by patient report continue to work with RN Care Manager to address care management and care coordination needs related to GERD, CAD, HTN, PVD, TIA, migranes, COPD, OSA, tobacco use, osteoarthritis, chronic pain, anxiety  as evidenced by adherence to CM Team Scheduled appointments through collaboration with RN Care manager, provider, and care team.   Interventions: Inter-disciplinary care team collaboration (see longitudinal plan of care) Evaluation of current treatment plan related to  self management and patient's adherence to plan as established by provider  CAD Interventions: (Status:  New goal.) Long Term Goal Assessed understanding of CAD diagnosis Medications reviewed including medications utilized in CAD treatment plan Counseled on importance of regular laboratory monitoring as prescribed Reviewed Importance of taking all medications as prescribed Reviewed Importance of attending all scheduled provider appointments Advised to report any changes in symptoms or exercise tolerance Screening for signs and  symptoms of depression related to chronic disease state Assessed social determinant of health barriers  COPD Interventions:  (Status:  New goal.) Long Term Goal Advised patient to self assesses COPD action plan zone and make appointment with provider if in the yellow zone for 48 hours without improvement Advised patient to engage in light exercise as tolerated 3-5 days a week to aid in the the management of COPD Provided education about and advised patient to utilize infection prevention strategies to reduce risk of respiratory infection Discussed the importance of adequate rest and management of fatigue with COPD Assessed social determinant of health barriers  Hypertension Interventions:  (Status:  New goal.) Long Term Goal Last practice recorded BP readings:  BP Readings from Last 3 Encounters:  09/12/22 116/75  08/13/22 124/77  07/19/22 100/62  10/23/22         130/78 12/02/22         159/78  Most recent eGFR/CrCl:  Lab Results  Component Value Date   EGFR 83 04/08/2022    No components found for: "CRCL"  Evaluation of current treatment plan related to hypertension self management and patient's adherence to plan as established by provider Reviewed medications with patient and discussed importance of compliance Discussed plans with patient for ongoing care management follow up and provided patient with direct contact information for care management  team Advised patient, providing education and rationale, to monitor blood pressure daily and record, calling PCP for findings outside established parameters Reviewed scheduled/upcoming provider appointments including:  Discussed complications of poorly controlled blood pressure such as heart disease, stroke, circulatory complications, vision complications, kidney impairment, sexual dysfunction Assessed social determinant of health barriers  Pain Interventions:  (Status:  New goal.) Long Term Goal Pain assessment performed Medications  reviewed Reviewed provider established plan for pain management Discussed importance of adherence to all scheduled medical appointments Counseled on the importance of reporting any/all new or changed pain symptoms or management strategies to pain management provider Advised patient to report to care team affect of pain on daily activities Reviewed with patient prescribed pharmacological and nonpharmacological pain relief strategies Assessed social determinant of health barriers  Patient Goals/Self-Care Activities: Take all medications as prescribed Attend all scheduled provider appointments Call pharmacy for medication refills 3-7 days in advance of running out of medications Perform all self care activities independently  Perform IADL's (shopping, preparing meals, housekeeping, managing finances) independently Call provider office for new concerns or questions   Follow Up Plan:  The patient has been provided with contact information for the care management team and has been advised to call with any health related questions or concerns.  The care management team will reach out to the patient again over the next 30 business  days.

## 2023-01-01 NOTE — Patient Outreach (Signed)
Medicaid Managed Care   Nurse Care Manager Note  01/01/2023 Name:  Sara Munoz MRN:  [id number] DOB:  1965-10-30  Sara Munoz is an 57 y.o. year old female who is a primary patient of [name], Sara Cooler, MD.  The Medicaid Managed Care Coordination team was consulted for assistance with:    Chronic healthcare management needs, GERD, CAD, HTN, tobacco use, COPD, OSA, anxiety, chronic pain, HLD, osteoarthritis  [name] was given information about Medicaid Managed Care Coordination team services today. Sara Munoz Patient agreed to services and verbal consent obtained.  Engaged with patient by telephone for follow up visit in response to provider referral for case management and/or care coordination services.   Assessments/Interventions:  Review of past medical history, allergies, medications, health status, including review of consultants reports, laboratory and other test data, was performed as part of comprehensive evaluation and provision of chronic care management services.  SDOH (Social Determinants of Health) assessments and interventions performed: SDOH Interventions    Flowsheet Row Patient Outreach Telephone from 01/01/2023 in [place] POPULATION HEALTH DEPARTMENT Patient Outreach Telephone from 12/02/2022 in [place] POPULATION HEALTH DEPARTMENT Patient Outreach Telephone from 10/23/2022 in [place] POPULATION HEALTH DEPARTMENT Patient Outreach Telephone from 09/12/2022 in [place] POPULATION HEALTH DEPARTMENT Patient Outreach Telephone from 05/14/2022 in [place] POPULATION HEALTH DEPARTMENT Patient Outreach Telephone from 04/15/2022 in [facility] Care Coordination  SDOH Interventions        Food Insecurity Interventions -- -- Intervention Not Indicated -- -- --  Housing Interventions -- -- -- -- Intervention Not Indicated --  Transportation Interventions Intervention Not Indicated -- -- -- -- Intervention Not Indicated  Utilities  Interventions -- Intervention Not Indicated -- -- -- --  Alcohol Usage Interventions -- Intervention Not Indicated (Score <7) -- -- -- --  Financial Strain Interventions Intervention Not Indicated -- -- -- -- Intervention Not Indicated  Physical Activity Interventions -- -- -- Intervention Not Indicated -- --  Stress Interventions -- -- -- -- Other (Comment)  [sees Psychiatrist and counselor] --     Care Plan  Allergies  Allergen Reactions   Cyclobenzaprine Hives, Swelling and Rash    Facial/lip swelling      Levofloxacin Hives, Swelling and Rash   Chlorpheniramine Maleate     Other reaction(s): Unknown   Phenergan [Promethazine Hcl] Other (See Comments)    Agitation.    Phenylephrine Hcl Other (See Comments)   Toradol [Ketorolac Tromethamine] Swelling and Other (See Comments)    Facial/tongue swelling    Zoloft [Sertraline Hcl] Swelling    Tongue swelling      Cephalexin Hives and Rash   Ketorolac Rash   Tramadol Hives, Swelling, Other (See Comments) and Rash    Lip swelling    Medications Reviewed Today     Reviewed by [name], RN (Registered Nurse) on 01/01/23 at 1205  Med List Status: <None>   Medication Order Taking? Sig Documenting Provider Last Dose Status Informant  albuterol (PROVENTIL) (2.5 MG/3ML) 0.083% nebulizer solution [id number] No Take 3 mLs (2.5 mg total) by nebulization every 6 (six) hours as needed for wheezing or shortness of breath. [name], [name], MD Past Month Active Self  albuterol (VENTOLIN HFA) 108 (90 Base) MCG/ACT inhaler [id number]  Inhale 1-2 puffs into the lungs every 6 (six) hours as needed. INHALE TWO PUFFS BY MOUTH INTO LUNGS EVERY 6 HOURS AS NEEDED FOR SHORTNESS OF BREATH OR wheezing Poggi, Excell Seltzer, MD  Active   amLODipine (NORVASC) 5  MG tablet [id number]  Take 1 tablet (5 mg total) by mouth at bedtime. [name], Sara Cooler, MD  Active   aspirin EC 81 MG tablet [id number] No Take 81 mg by mouth daily. Swallow whole. [provider] Past Week Active Self  atorvastatin (LIPITOR) 40 MG tablet [id number] No Take 1 tablet (40 mg total) by mouth every evening. Debbe Odea, MD 12/02/2022 Active Self  docusate sodium (COLACE) 100 MG capsule [id number] No Take 100 mg by mouth 2 (two) times daily. [provider] Past Week Active Self  ELDERBERRY PO [id number] No Take 1 tablet by mouth daily at 6 (six) AM. [provider] Past Week Active   gabapentin (NEURONTIN) 600 MG tablet [id number] No Take 600 mg by mouth 4 (four) times daily. [provider] 12/03/2022 0600 Active Self  lactulose (CHRONULAC) 10 GM/15ML solution [id number] No Take 30 mLs by mouth in the morning, at noon, in the evening, and at bedtime. [provider] Past Week Active Self  LINZESS 290 MCG CAPS capsule [id number] No Take 290 mcg by mouth daily as needed (constipation). [provider] Past Week Active Self  lisinopril (ZESTRIL) 20 MG tablet [id number]  Take 2 tablets (40 mg total) by mouth daily with lunch. [name], [name], MD  Active   metoprolol tartrate (LOPRESSOR) 25 MG tablet [id number]  Take 1 tablet (25 mg total) by mouth 2 (two) times daily. [name], [name], MD  Active   mometasone (ELOCON) 0.1 % cream [id number]  Apply 1 Application topically daily as needed (hand rash). Poggi, Excell Seltzer, MD  Active   mometasone-formoterol [facility]) 100-5 MCG/ACT Sandrea Matte [id number]  Inhale 2 puffs into the lungs every morning. Poggi, Excell Seltzer, MD  Active   Multiple Vitamins-Minerals [facility] SKIN & NAILS PO) [id number] No Take 1 tablet by mouth daily at 6 (six) AM. [provider] Past Week Active   naloxone (NARCAN) 4 MG/0.1ML LIQD nasal spray kit [id number] No Place 1 spray into the nose as needed (opioid overdose). [provider] Taking Active Self           Med Note Richardean Canal May 03, 2021 11:23 AM)    Omega-3 Fatty Acids (OMEGA 3 PO) [id number] No Take 1 capsule by mouth daily  at 6 (six) AM. [provider] Past Week Active   omeprazole (PRILOSEC) 40 MG capsule [id number] No TAKE 1 CAPSULE BY MOUTH TWICE A Edwyna Ready, MD 12/03/2022 0600 Active Self  ondansetron (ZOFRAN) 8 MG tablet [id number]  TAKE 1 TABLET BY MOUTH EVERY 8 HOURS AS NEEDED FOR NAUSEA OR VOMITING. Debera Lat, [name]  Active   oxyCODONE (ROXICODONE) 15 MG immediate release tablet [id number] No Take 15 mg by mouth every 4 (four) hours as needed for pain. [provider] 11/05/2022 0500 Active Self  oxyCODONE (ROXICODONE) 5 MG immediate release tablet [id number]  Take 1-2 tablets (5-10 mg total) by mouth every 4 (four) hours as needed for breakthrough pain. Poggi, Excell Seltzer, MD  Active   sucralfate (CARAFATE) 1 GM/10ML suspension [id number] No TAKE 10 MLS (1 G TOTAL) BY MOUTH 4 (FOUR) TIMES DAILY. [name], MD Past Week Active Self  Tiotropium Bromide Monohydrate (SPIRIVA RESPIMAT) 2.5 MCG/ACT AERS [id number]  INHALE 1 PUFF BY MOUTH INTO LUNGS ONCE DAILY [name], [name], MD  Active Self  TURMERIC PO [id number] No Take 1 tablet by mouth daily at 6 (six) AM. [provider] Past Week Active  Patient Active Problem List   Diagnosis Date Noted   Atopic dermatitis of both hands 09/12/2022   Headache disorder 08/13/2022   Encounter for annual physical examination excluding gynecological examination in a patient older than 17 years 04/03/2022   Essential hypertension 03/11/2022   Lumbar spondylosis 02/12/2022   Chronic migraine w/o aura w/o status migrainosus, not intractable 07/09/2021   Obstructive sleep apnea 07/09/2021   Body mass index (BMI) 32.0-32.9, adult 06/07/2021   Pseudoarthrosis of cervical spine (HCC) 05/14/2021   Lumbar radiculopathy 05/23/2020   Sacroiliac inflammation (HCC) 11/25/2019   Sepsis (HCC) 10/19/2018   Severe sepsis (HCC) 10/19/2018   Cervical radiculopathy 02/12/2018   Chronic migraine 02/12/2018   Paresthesia 08/29/2017   Neck  pain 08/29/2017   Daily headache 08/29/2017   Chronic active hepatitis (HCC) 07/29/2017   Neurogenic pain 07/31/2016   Chronic pain syndrome 06/05/2016   Carotid artery stenosis 02/27/2016   Chronic constipation 02/27/2016   Chronic nausea 02/27/2016   Hypercholesterolemia 02/27/2016   Osteoarthritis 02/27/2016   PTSD (post-traumatic stress disorder) 02/27/2016   TIA (transient ischemic attack) 02/27/2016   Long term current use of opiate analgesic 02/27/2016   Long term prescription opiate use 02/27/2016   Encounter for pain management consult 02/27/2016   Chronic hip pain (Location of Tertiary source of pain) (Bilateral) (L>R) 02/27/2016   Chronic knee pain (Bilateral) (L>R) 02/27/2016   Chronic shoulder pain (Bilateral) (L>R) 02/27/2016   Chronic sacroiliac joint pain (Bilateral) (L>R) 02/27/2016   Chronic low back pain (Location of Primary Source of Pain) (Bilateral) (L>R) 02/27/2016   Chronic lower extremity pain (Location of Secondary source of pain) (Bilateral) (L>R) 02/27/2016   Osteoarthritis of hip (Bilateral) (L>R) 02/27/2016   Chronic neck pain (posterior midline) (Bilateral) (L>R) 02/27/2016   Cervicogenic headache (Bilateral) (L>R) 02/27/2016   Occipital headache (Bilateral) (L>R) 02/27/2016   Chronic upper extremity pain (Bilateral) (L>R) 02/27/2016   Chronic Cervical radicular pain (Bilateral) (L>R) 02/27/2016   Chronic lumbar radicular pain (Right) (L5 dermatome) 02/27/2016   Lumbar facet syndrome (Bilateral) (L>R) 02/27/2016   Long term prescription benzodiazepine use 02/27/2016   Hypertension    Chronic obstructive pulmonary disease (HCC) 10/09/2015   GERD (gastroesophageal reflux disease) 10/09/2015   Non-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/2014 08/02/2015   Hyperlipidemia 08/02/2015   Costochondritis 08/02/2015   Drug abuse, 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   GAD (generalized anxiety disorder) 09/03/2014   Narcotic dependence (HCC) 09/03/2014   PVD (peripheral vascular  disease) (HCC) 09/03/2014   Smoker 09/03/2014   Cigarette nicotine dependence with nicotine-induced disorder 07/08/2014   Anemia of chronic disease 03/19/2014   Narcotic abuse, continuous (HCC) 03/19/2014   Polysubstance abuse (HCC) 03/19/2014   MSSA (methicillin susceptible Staphylococcus aureus) septicemia (HCC) 03/07/2014   Hypomagnesemia 01/04/2014   Chronic cough 09/04/2011   Sleep apnea, obstructive 09/04/2011   Sinusitis, acute 09/04/2011   Conditions to be addressed/monitored per PCP order:  Chronic healthcare management needs, GERD, CAD, HTN, tobacco use, COPD, OSA, anxiety, chronic pain, HLD, osteoarthritis  Care Plan : RN Care Manager Plan of Care  Updates made by [name], RN since 01/01/2023 12:00 AM     Problem: Health Promotion or Disease Self-Management (General Plan of Care)      Long-Range Goal: Chronic Disease Management   Start Date: 09/12/2022  Expected End Date: 03/04/2023  Priority: High  Note:   Current Barriers:  Knowledge Deficits related to plan of care for management of GERD, CAD, HTN, PVD, TIA, migranes, COPD, OSA, tobacco use, osteoarthritis,  chronic pain, anxiety  Chronic Disease Management support and education needs related to  GERD, CAD, HTN, PVD, TIA, migranes, COPD, OSA, tobacco use, osteoarthritis, chronic pain, anxiety  01/01/23:  Recovering well from rotator cuff surgery-attending PT.  Still needs to schedule an appt with GI.  Has f/u appts with pain management, PCP scheduled.  BP stable and no change in smoking.  RNCM Clinical Goal(s):  Patient will verbalize understanding of plan for management of GERD, CAD, HTN, PVD, TIA, migranes, COPD, OSA, tobacco use, osteoarthritis, chronic pain, anxiety   as evidenced by patient report verbalize basic understanding of  GERD, CAD, HTN, PVD, TIA, migranes, COPD, OSA, tobacco use, osteoarthritis, chronic pain, anxiety  disease process and self health management plan as evidenced by patient report take  all medications exactly as prescribed and will call provider for medication related questions as evidenced by patient report  demonstrate understanding of rationale for each prescribed medication as evidenced by patient report attend all scheduled medical appointments as evidenced by patient report and EMR review demonstrate Ongoing adherence to prescribed treatment plan for  GERD, CAD, HTN, PVD, TIA, migranes, COPD, OSA, tobacco use, osteoarthritis, chronic pain, anxiety as evidenced by patient report continue to work with RN Care Manager to address care management and care coordination needs related to GERD, CAD, HTN, PVD, TIA, migranes, COPD, OSA, tobacco use, osteoarthritis, chronic pain, anxiety  as evidenced by adherence to CM Team Scheduled appointments through collaboration with RN Care manager, provider, and care team.   Interventions: Inter-disciplinary care team collaboration (see longitudinal plan of care) Evaluation of current treatment plan related to  self management and patient's adherence to plan as established by provider  CAD Interventions: (Status:  New goal.) Long Term Goal Assessed understanding of CAD diagnosis Medications reviewed including medications utilized in CAD treatment plan Counseled on importance of regular laboratory monitoring as prescribed Reviewed Importance of taking all medications as prescribed Reviewed Importance of attending all scheduled provider appointments Advised to report any changes in symptoms or exercise tolerance Screening for signs and symptoms of depression related to chronic disease state Assessed social determinant of health barriers  COPD Interventions:  (Status:  New goal.) Long Term Goal Advised patient to self assesses COPD action plan zone and make appointment with provider if in the yellow zone for 48 hours without improvement Advised patient to engage in light exercise as tolerated 3-5 days a week to aid in the the management of  COPD Provided education about and advised patient to utilize infection prevention strategies to reduce risk of respiratory infection Discussed the importance of adequate rest and management of fatigue with COPD Assessed social determinant of health barriers  Hypertension Interventions:  (Status:  New goal.) Long Term Goal Last practice recorded BP readings:  BP Readings from Last 3 Encounters:  09/12/22 116/75  08/13/22 124/77  07/19/22 100/62  10/23/22         130/78 12/02/22         159/78  Most recent eGFR/CrCl:  Lab Results  Component Value Date   EGFR 83 04/08/2022    No components found for: "CRCL"  Evaluation of current treatment plan related to hypertension self management and patient's adherence to plan as established by provider Reviewed medications with patient and discussed importance of compliance Discussed plans with patient for ongoing care management follow up and provided patient with direct contact information for care management team Advised patient, providing education and rationale, to monitor blood pressure daily and record, calling PCP for  findings outside established parameters Reviewed scheduled/upcoming provider appointments including:  Discussed complications of poorly controlled blood pressure such as heart disease, stroke, circulatory complications, vision complications, kidney impairment, sexual dysfunction Assessed social determinant of health barriers  Pain Interventions:  (Status:  New goal.) Long Term Goal Pain assessment performed Medications reviewed Reviewed provider established plan for pain management Discussed importance of adherence to all scheduled medical appointments Counseled on the importance of reporting any/all new or changed pain symptoms or management strategies to pain management provider Advised patient to report to care team affect of pain on daily activities Reviewed with patient prescribed pharmacological and nonpharmacological  pain relief strategies Assessed social determinant of health barriers  Patient Goals/Self-Care Activities: Take all medications as prescribed Attend all scheduled provider appointments Call pharmacy for medication refills 3-7 days in advance of running out of medications Perform all self care activities independently  Perform IADL's (shopping, preparing meals, housekeeping, managing finances) independently Call provider office for new concerns or questions   Follow Up Plan:  The patient has been provided with contact information for the care management team and has been advised to call with any health related questions or concerns.  The care management team will reach out to the patient again over the next 30 business  days.   Follow Up:  Patient agrees to Care Plan and Follow-up.  Plan: The Managed Medicaid care management team will reach out to the patient again over the next 30 business  days. and The  Patient has been provided with contact information for the Managed Medicaid care management team and has been advised to call with any health related questions or concerns.  Date/time of next scheduled RN care management/care coordination outreach: 02/03/23 at 230

## 2023-01-06 ENCOUNTER — Ambulatory Visit (INDEPENDENT_AMBULATORY_CARE_PROVIDER_SITE_OTHER): Payer: 59 | Admitting: Family Medicine

## 2023-01-06 ENCOUNTER — Encounter: Payer: Self-pay | Admitting: Family Medicine

## 2023-01-06 VITALS — BP 115/66 | HR 56 | Ht 64.0 in | Wt 188.2 lb

## 2023-01-06 DIAGNOSIS — R7303 Prediabetes: Secondary | ICD-10-CM | POA: Diagnosis not present

## 2023-01-06 DIAGNOSIS — R630 Anorexia: Secondary | ICD-10-CM | POA: Diagnosis not present

## 2023-01-06 DIAGNOSIS — R5382 Chronic fatigue, unspecified: Secondary | ICD-10-CM | POA: Diagnosis not present

## 2023-01-06 DIAGNOSIS — I10 Essential (primary) hypertension: Secondary | ICD-10-CM

## 2023-01-06 MED ORDER — METOPROLOL TARTRATE 25 MG PO TABS
12.5000 mg | ORAL_TABLET | Freq: Two times a day (BID) | ORAL | Status: DC
Start: 2023-01-06 — End: 2023-01-16

## 2023-01-06 NOTE — Progress Notes (Signed)
Established patient visit   Patient: Sara Munoz   DOB: 08-16-1965   57 y.o. Female  MRN: [id number] Visit Date: 01/06/2023  Today's healthcare provider: Ronnald Ramp, MD   Chief Complaint  Patient presents with   Medical Management of Chronic Issues    Patient is present for htn f/u. Patient was last seen on 04/03/22 and advised to continue current treatment. Reports symptoms of lower leg edema. Patient reports she is still having concerns with feeling fatigue all the time and her appetite (only eating cereal) and would like to discuss weight loss options   Subjective     HPI     Medical Management of Chronic Issues    Additional comments: Patient is present for htn f/u. Patient was last seen on 04/03/22 and advised to continue current treatment. Reports symptoms of lower leg edema. Patient reports she is still having concerns with feeling fatigue all the time and her appetite (only eating cereal) and would like to discuss weight loss options      Last edited by Acey Lav, CMA on 01/06/2023  1:43 PM.    Discussed the use of AI scribe software for clinical note transcription with the patient, who gave verbal consent to proceed.  History of Present Illness    Decreased Appetite  The patient, with a history of hypertension, presents with a chief complaint of decreased appetite and desire for weight loss. She reports a significant change in eating habits, primarily consuming cereal for breakfast and dinner, with minimal intake during the day. Despite this, she has only lost two pounds over the past month. She also reports chronic nausea, which she attributes to ongoing stomach issues.  The patient has a history of precancerous polyps and is currently experiencing significant issues with hemorrhoids and general stomach discomfort. Reports that she has seen Dr. Tobi Bastos with GI in the past and is planning to schedule a follow up appt with him.   She also reports  difficulty with mobility, limiting her ability to exercise. She has recently undergone two surgeries, one on her shoulder and another on her cervical spine. She is still experiencing pain from these surgeries and believes she may require additional surgery on her cervical spine.  The patient admits to smoking approximately one pack of cigarettes per day, an increase she attributes to recent stress.   HTN  Despite this, her blood pressure is well controlled. She reports occasional dizziness when feeling particularly nauseous but does not believe it to be a significant issue. She also reports taking over-the-counter vitamins but does not currently take a multivitamin.  Prediabetes  Anemia  The patient's recent labs showed prediabetes with an A1c of 5.8 and a slightly low hemoglobin level of 10.4, potentially indicating anemia. Her thyroid function was normal at her last check.        Medications: Outpatient Medications Prior to Visit  Medication Sig   albuterol (PROVENTIL) (2.5 MG/3ML) 0.083% nebulizer solution Take 3 mLs (2.5 mg total) by nebulization every 6 (six) hours as needed for wheezing or shortness of breath.   albuterol (VENTOLIN HFA) 108 (90 Base) MCG/ACT inhaler Inhale 1-2 puffs into the lungs every 6 (six) hours as needed. INHALE TWO PUFFS BY MOUTH INTO LUNGS EVERY 6 HOURS AS NEEDED FOR SHORTNESS OF BREATH OR wheezing   amLODipine (NORVASC) 5 MG tablet Take 1 tablet (5 mg total) by mouth at bedtime.   aspirin EC 81 MG tablet Take 81 mg by mouth daily. Swallow  whole.   atorvastatin (LIPITOR) 40 MG tablet Take 1 tablet (40 mg total) by mouth every evening.   docusate sodium (COLACE) 100 MG capsule Take 100 mg by mouth 2 (two) times daily.   ELDERBERRY PO Take 1 tablet by mouth daily at 6 (six) AM.   gabapentin (NEURONTIN) 600 MG tablet Take 600 mg by mouth 4 (four) times daily.   lactulose (CHRONULAC) 10 GM/15ML solution Take 30 mLs by mouth in the morning, at noon, in the evening,  and at bedtime.   LINZESS 290 MCG CAPS capsule Take 290 mcg by mouth daily as needed (constipation).   lisinopril (ZESTRIL) 20 MG tablet Take 2 tablets (40 mg total) by mouth daily with lunch.   mometasone (ELOCON) 0.1 % cream Apply 1 Application topically daily as needed (hand rash).   mometasone-formoterol (DULERA) 100-5 MCG/ACT AERO Inhale 2 puffs into the lungs every morning.   Multiple Vitamins-Minerals (HAIR SKIN & NAILS PO) Take 1 tablet by mouth daily at 6 (six) AM.   naloxone (NARCAN) 4 MG/0.1ML LIQD nasal spray kit Place 1 spray into the nose as needed (opioid overdose).   Omega-3 Fatty Acids (OMEGA 3 PO) Take 1 capsule by mouth daily at 6 (six) AM.   omeprazole (PRILOSEC) 40 MG capsule TAKE 1 CAPSULE BY MOUTH TWICE A DAY   ondansetron (ZOFRAN) 8 MG tablet TAKE 1 TABLET BY MOUTH EVERY 8 HOURS AS NEEDED FOR NAUSEA OR VOMITING.   oxyCODONE (ROXICODONE) 15 MG immediate release tablet Take 15 mg by mouth every 4 (four) hours as needed for pain.   sucralfate (CARAFATE) 1 GM/10ML suspension TAKE 10 MLS (1 G TOTAL) BY MOUTH 4 (FOUR) TIMES DAILY.   Tiotropium Bromide Monohydrate (SPIRIVA RESPIMAT) 2.5 MCG/ACT AERS INHALE 1 PUFF BY MOUTH INTO LUNGS ONCE DAILY   TURMERIC PO Take 1 tablet by mouth daily at 6 (six) AM.   [DISCONTINUED] metoprolol tartrate (LOPRESSOR) 25 MG tablet Take 1 tablet (25 mg total) by mouth 2 (two) times daily.   oxyCODONE (ROXICODONE) 5 MG immediate release tablet Take 1-2 tablets (5-10 mg total) by mouth every 4 (four) hours as needed for breakthrough pain. (Patient not taking: Reported on 01/06/2023)   No facility-administered medications prior to visit.    Review of Systems       Objective    BP 115/66   Pulse (!) 56   Ht 5\' 4"  (1.626 m)   Wt 188 lb 3.2 oz (85.4 kg)   LMP 12/16/2017 (Approximate)   SpO2 100%   BMI 32.30 kg/m      Physical Exam  Physical Exam   VITALS: BP- 115/66 MEASUREMENTS: WT- 188 NECK: Thyroid not enlarged, no nodules  palpated. EXTREMITIES: Muscular calf muscles, no edema. SKIN: Mucous membranes moist, good capillary refill in fingertips.       No results found for any visits on 01/06/23.  Assessment & Plan     Problem List Items Addressed This Visit     Chronic fatigue - Primary    Ongoing decreased appetite with preference for cereal. No signs of dehydration. Prediabetic A1C (5.8) in the past. -Encouraged to start a multivitamin. -Check thyroid function and A1C today to rule out metabolic causes. -Consider protein drinks for nutritional support.      Relevant Orders   Hemoglobin A1c   CMP14+EGFR   CBC   Vitamin B12   TSH+T4F+T3Free   Hypertension    Well controlled on Metoprolol 25mg  BID. No symptoms of hypotension. -Reduce Metoprolol to 12.5mg  BID due  to concerns of possible medication side effects contributing to fatigue. -Monitor blood pressure and heart rate at home. -Follow-up in 3 weeks to reassess blood pressure control.      Relevant Medications   metoprolol tartrate (LOPRESSOR) 25 MG tablet   Other Relevant Orders   CMP14+EGFR   Prediabetes   Relevant Orders   Hemoglobin A1c   Other Visit Diagnoses     Decreased appetite             Smoking: Current smoker, approximately 1 pack per day. -Encouraged to consider smoking cessation strategies.     Return in about 3 weeks (around 01/27/2023) for HTN.         Ronnald Ramp, MD  [facility] [phone] (phone) [phone] (fax)  [facility]

## 2023-01-06 NOTE — Patient Instructions (Signed)
Please check your blood pressure 1-2 hours after your medications for your blood pressure  Please decrease metoprolol to 12.5mg  twice daily and monitor your heart rate, goal is between 60 and 100    Please see me in 3-4 weeks for blood pressure follow up    We will follow up with results of labs once they are available.

## 2023-01-06 NOTE — Assessment & Plan Note (Signed)
Ongoing decreased appetite with preference for cereal. No signs of dehydration. Prediabetic A1C (5.8) in the past. -Encouraged to start a multivitamin. -Check thyroid function and A1C today to rule out metabolic causes. -Consider protein drinks for nutritional support.

## 2023-01-06 NOTE — Assessment & Plan Note (Signed)
Well controlled on Metoprolol 25mg  BID. No symptoms of hypotension. -Reduce Metoprolol to 12.5mg  BID due to concerns of possible medication side effects contributing to fatigue. -Monitor blood pressure and heart rate at home. -Follow-up in 3 weeks to reassess blood pressure control.

## 2023-01-07 ENCOUNTER — Other Ambulatory Visit: Payer: Self-pay

## 2023-01-07 DIAGNOSIS — Z862 Personal history of diseases of the blood and blood-forming organs and certain disorders involving the immune mechanism: Secondary | ICD-10-CM

## 2023-01-07 LAB — CBC
Hematocrit: 31.8 % — ABNORMAL LOW (ref 34.0–46.6)
Hemoglobin: 9.7 g/dL — ABNORMAL LOW (ref 11.1–15.9)
MCH: 23.3 pg — ABNORMAL LOW (ref 26.6–33.0)
MCHC: 30.5 g/dL — ABNORMAL LOW (ref 31.5–35.7)
MCV: 76 fL — ABNORMAL LOW (ref 79–97)
Platelets: 200 10*3/uL (ref 150–450)
RBC: 4.17 x10E6/uL (ref 3.77–5.28)
RDW: 17.2 % — ABNORMAL HIGH (ref 11.7–15.4)
WBC: 7.5 10*3/uL (ref 3.4–10.8)

## 2023-01-07 LAB — TSH+T4F+T3FREE
Free T4: 0.99 ng/dL (ref 0.82–1.77)
T3, Free: 3.1 pg/mL (ref 2.0–4.4)
TSH: 1.06 u[IU]/mL (ref 0.450–4.500)

## 2023-01-07 LAB — CMP14+EGFR
ALT: 10 IU/L (ref 0–32)
AST: 15 IU/L (ref 0–40)
Albumin: 4 g/dL (ref 3.8–4.9)
Alkaline Phosphatase: 85 IU/L (ref 44–121)
BUN/Creatinine Ratio: 12 (ref 9–23)
BUN: 11 mg/dL (ref 6–24)
Bilirubin Total: <0.2 mg/dL (ref 0.0–1.2)
CO2: 24 mmol/L (ref 20–29)
Calcium: 9.5 mg/dL (ref 8.7–10.2)
Chloride: 99 mmol/L (ref 96–106)
Creatinine, Ser: 0.95 mg/dL (ref 0.57–1.00)
Globulin, Total: 2.5 g/dL (ref 1.5–4.5)
Glucose: 87 mg/dL (ref 70–99)
Potassium: 5 mmol/L (ref 3.5–5.2)
Sodium: 138 mmol/L (ref 134–144)
Total Protein: 6.5 g/dL (ref 6.0–8.5)
eGFR: 71 mL/min/{1.73_m2} (ref >59)

## 2023-01-07 LAB — HEMOGLOBIN A1C
Est. average glucose Bld gHb Est-mCnc: 126 mg/dL
Hgb A1c MFr Bld: 6 % — ABNORMAL HIGH (ref 4.8–5.6)

## 2023-01-07 LAB — VITAMIN B12: Vitamin B-12: 512 pg/mL (ref 232–1245)

## 2023-01-15 ENCOUNTER — Telehealth: Payer: Self-pay | Admitting: Family Medicine

## 2023-01-15 DIAGNOSIS — I10 Essential (primary) hypertension: Secondary | ICD-10-CM

## 2023-01-15 DIAGNOSIS — J432 Centrilobular emphysema: Secondary | ICD-10-CM

## 2023-01-15 NOTE — Telephone Encounter (Signed)
[facility] faxed refill request for the following medications:  amLODipine (NORVASC) 5 MG tablet    albuterol (VENTOLIN HFA) 108 (90 Base) MCG/ACT inhaler    Tiotropium Bromide Monohydrate (SPIRIVA RESPIMAT) 2.5 MCG/ACT AERS   Please advise.

## 2023-01-16 ENCOUNTER — Other Ambulatory Visit: Payer: Self-pay

## 2023-01-16 MED ORDER — LISINOPRIL 20 MG PO TABS
40.0000 mg | ORAL_TABLET | Freq: Every day | ORAL | 1 refills | Status: DC
Start: 2023-01-16 — End: 2023-03-03

## 2023-01-16 MED ORDER — OMEPRAZOLE 40 MG PO CPDR: 40.0000 mg | DELAYED_RELEASE_CAPSULE | Freq: Two times a day (BID) | ORAL | 3 refills | Status: DC

## 2023-01-16 MED ORDER — AMLODIPINE BESYLATE 5 MG PO TABS
5.0000 mg | ORAL_TABLET | Freq: Every evening | ORAL | 0 refills | Status: DC
Start: 2023-01-16 — End: 2023-03-03

## 2023-01-16 MED ORDER — SPIRIVA RESPIMAT 2.5 MCG/ACT IN AERS
INHALATION_SPRAY | RESPIRATORY_TRACT | 1 refills | Status: DC
Start: 2023-01-16 — End: 2023-01-23

## 2023-01-16 MED ORDER — METOPROLOL TARTRATE 25 MG PO TABS
12.5000 mg | ORAL_TABLET | Freq: Two times a day (BID) | ORAL | 1 refills | Status: DC
Start: 2023-01-16 — End: 2023-03-03

## 2023-01-16 MED ORDER — ALBUTEROL SULFATE HFA 108 (90 BASE) MCG/ACT IN AERS
1.0000 | INHALATION_SPRAY | Freq: Four times a day (QID) | RESPIRATORY_TRACT | 1 refills | Status: DC | PRN
Start: 2023-01-16 — End: 2023-01-31

## 2023-01-17 ENCOUNTER — Other Ambulatory Visit: Payer: Self-pay | Admitting: Physician Assistant

## 2023-01-17 DIAGNOSIS — M7581 Other shoulder lesions, right shoulder: Secondary | ICD-10-CM | POA: Insufficient documentation

## 2023-01-17 DIAGNOSIS — M7521 Bicipital tendinitis, right shoulder: Secondary | ICD-10-CM | POA: Insufficient documentation

## 2023-01-17 DIAGNOSIS — R110 Nausea: Secondary | ICD-10-CM

## 2023-01-17 NOTE — Telephone Encounter (Signed)
Requested medication (s) are due for refill today - yes  Requested medication (s) are on the active medication list -yes  Future visit scheduled -yes  Last refill: 12/12/22 #90  Notes to clinic: non delegated Rx  Requested Prescriptions  Pending Prescriptions Disp Refills   ondansetron (ZOFRAN) 8 MG tablet [Pharmacy Med Name: ONDANSETRON HCL 8 MG TABLET] 90 tablet 0    Sig: TAKE 1 TABLET BY MOUTH EVERY 8 HOURS AS NEEDED FOR NAUSEA AND VOMITING     Not Delegated - Gastroenterology: Antiemetics - ondansetron Failed - 01/17/2023  3:38 PM      Failed - This refill cannot be delegated      Passed - AST in normal range and within 360 days    AST  Date Value Ref Range Status  01/06/2023 15 0 - 40 IU/L Final         Passed - ALT in normal range and within 360 days    ALT  Date Value Ref Range Status  01/06/2023 10 0 - 32 IU/L Final         Passed - Valid encounter within last 6 months    Recent Outpatient Visits           1 week ago Chronic fatigue   [place] [facility] [name], [place], MD   4 months ago Atopic dermatitis of both hands   [place] [facility] Merita Norton T, FNP   5 months ago Headache disorder   [place] [facility] [name], [place], MD   9 months ago Essential hypertension   [place] [facility] [name], [place], MD   10 months ago Centrilobular emphysema [facility])   [facility] [name], Tawanna Cooler, MD       Future Appointments             In 1 week [name], Tawanna Cooler, MD [facility]   In 2 weeks Drema Dallas, DO [place] [facility]   In 3 months Agbor-Etang, Arlys John, MD [place] [facility]               Requested Prescriptions  Pending Prescriptions Disp Refills   ondansetron (ZOFRAN) 8 MG tablet [Pharmacy Med Name: ONDANSETRON HCL 8 MG TABLET] 90  tablet 0    Sig: TAKE 1 TABLET BY MOUTH EVERY 8 HOURS AS NEEDED FOR NAUSEA AND VOMITING     Not Delegated - Gastroenterology: Antiemetics - ondansetron Failed - 01/17/2023  3:38 PM      Failed - This refill cannot be delegated      Passed - AST in normal range and within 360 days    AST  Date Value Ref Range Status  01/06/2023 15 0 - 40 IU/L Final         Passed - ALT in normal range and within 360 days    ALT  Date Value Ref Range Status  01/06/2023 10 0 - 32 IU/L Final         Passed - Valid encounter within last 6 months    Recent Outpatient Visits           1 week ago Chronic fatigue   [place] [facility] [name], [place], MD   4 months ago Atopic dermatitis of both hands   [place] [facility] Merita Norton T, FNP   5 months ago Headache disorder   [place] [facility] Ronnald Ramp, MD  9 months ago Essential hypertension   [place] [facility] [name], [place], MD   10 months ago Centrilobular emphysema [facility])   [place] [facility] [name], Tawanna Cooler, MD       Future Appointments             In 1 week [name], Tawanna Cooler, MD [facility]   In 2 weeks Drema Dallas, DO [facility]   In 3 months Agbor-Etang, Arlys John, MD [facility]

## 2023-01-20 ENCOUNTER — Other Ambulatory Visit: Payer: Self-pay | Admitting: Surgery

## 2023-01-20 ENCOUNTER — Other Ambulatory Visit: Payer: Self-pay

## 2023-01-20 DIAGNOSIS — M7521 Bicipital tendinitis, right shoulder: Secondary | ICD-10-CM

## 2023-01-20 DIAGNOSIS — M7581 Other shoulder lesions, right shoulder: Secondary | ICD-10-CM

## 2023-01-20 DIAGNOSIS — J432 Centrilobular emphysema: Secondary | ICD-10-CM

## 2023-01-23 ENCOUNTER — Other Ambulatory Visit: Payer: Self-pay | Admitting: Family Medicine

## 2023-01-23 DIAGNOSIS — J432 Centrilobular emphysema: Secondary | ICD-10-CM

## 2023-01-23 MED ORDER — SPIRIVA RESPIMAT 2.5 MCG/ACT IN AERS
1.0000 | INHALATION_SPRAY | Freq: Two times a day (BID) | RESPIRATORY_TRACT | 11 refills | Status: DC
Start: 2023-01-23 — End: 2024-03-30

## 2023-01-24 ENCOUNTER — Ambulatory Visit
Admission: RE | Admit: 2023-01-24 | Discharge: 2023-01-24 | Disposition: A | Discharge status: Home / Self Care | Payer: 59 | Source: Ambulatory Visit | Attending: Surgery | Admitting: Surgery

## 2023-01-24 DIAGNOSIS — M7521 Bicipital tendinitis, right shoulder: Secondary | ICD-10-CM

## 2023-01-24 DIAGNOSIS — M7581 Other shoulder lesions, right shoulder: Secondary | ICD-10-CM

## 2023-01-24 NOTE — Progress Notes (Deleted)
Established patient visit   Patient: Sara Munoz   DOB: 12-01-1965   57 y.o. Female  MRN: [id number] Visit Date: 01/29/2023  Today's healthcare provider: Ronnald Ramp, MD   No chief complaint on file.  Subjective      HTN  Changed metoprolol to 12.5mg  twice daily, amlodipine 5mg  every day, lisinopril 20mg  every day   Medications: Outpatient Medications Prior to Visit  Medication Sig   albuterol (PROVENTIL) (2.5 MG/3ML) 0.083% nebulizer solution Take 3 mLs (2.5 mg total) by nebulization every 6 (six) hours as needed for wheezing or shortness of breath.   albuterol (VENTOLIN HFA) 108 (90 Base) MCG/ACT inhaler Inhale 1-2 puffs into the lungs every 6 (six) hours as needed. INHALE TWO PUFFS BY MOUTH INTO LUNGS EVERY 6 HOURS AS NEEDED FOR SHORTNESS OF BREATH OR wheezing   amLODipine (NORVASC) 5 MG tablet Take 1 tablet (5 mg total) by mouth at bedtime.   aspirin EC 81 MG tablet Take 81 mg by mouth daily. Swallow whole.   atorvastatin (LIPITOR) 40 MG tablet Take 1 tablet (40 mg total) by mouth every evening.   docusate sodium (COLACE) 100 MG capsule Take 100 mg by mouth 2 (two) times daily.   ELDERBERRY PO Take 1 tablet by mouth daily at 6 (six) AM.   gabapentin (NEURONTIN) 600 MG tablet Take 600 mg by mouth 4 (four) times daily.   lactulose (CHRONULAC) 10 GM/15ML solution Take 30 mLs by mouth in the morning, at noon, in the evening, and at bedtime.   LINZESS 290 MCG CAPS capsule Take 290 mcg by mouth daily as needed (constipation).   lisinopril (ZESTRIL) 20 MG tablet Take 2 tablets (40 mg total) by mouth daily with lunch.   metoprolol tartrate (LOPRESSOR) 25 MG tablet Take 0.5 tablets (12.5 mg total) by mouth 2 (two) times daily.   mometasone (ELOCON) 0.1 % cream Apply 1 Application topically daily as needed (hand rash).   mometasone-formoterol (DULERA) 100-5 MCG/ACT AERO Inhale 2 puffs into the lungs every morning.   Multiple Vitamins-Minerals (HAIR SKIN & NAILS PO)  Take 1 tablet by mouth daily at 6 (six) AM.   naloxone (NARCAN) 4 MG/0.1ML LIQD nasal spray kit Place 1 spray into the nose as needed (opioid overdose).   Omega-3 Fatty Acids (OMEGA 3 PO) Take 1 capsule by mouth daily at 6 (six) AM.   omeprazole (PRILOSEC) 40 MG capsule Take 1 capsule (40 mg total) by mouth 2 (two) times daily.   ondansetron (ZOFRAN) 8 MG tablet TAKE 1 TABLET BY MOUTH EVERY 8 HOURS AS NEEDED FOR NAUSEA AND VOMITING   oxyCODONE (ROXICODONE) 15 MG immediate release tablet Take 15 mg by mouth every 4 (four) hours as needed for pain.   oxyCODONE (ROXICODONE) 5 MG immediate release tablet Take 1-2 tablets (5-10 mg total) by mouth every 4 (four) hours as needed for breakthrough pain. (Patient not taking: Reported on 01/06/2023)   sucralfate (CARAFATE) 1 GM/10ML suspension TAKE 10 MLS (1 G TOTAL) BY MOUTH 4 (FOUR) TIMES DAILY.   Tiotropium Bromide Monohydrate (SPIRIVA RESPIMAT) 2.5 MCG/ACT AERS Take 1 puff by mouth in the morning and at bedtime.   TURMERIC PO Take 1 tablet by mouth daily at 6 (six) AM.   No facility-administered medications prior to visit.    Review of Systems  {Insert previous labs (optional):23779}  {See past labs  Heme  Chem  Endocrine  Serology  Results Review (optional):1}   Objective    LMP 12/16/2017 (Approximate)  {  Insert last BP/Wt (optional):23777}  {See vitals history (optional):1}  Physical Exam  ***  No results found for any visits on 01/29/23.  Assessment & Plan     Problem List Items Addressed This Visit   None    No follow-ups on file.         Ronnald Ramp, MD  [facility] [phone] (phone) [phone] (fax)  [facility]

## 2023-01-24 NOTE — Progress Notes (Signed)
Pt unable to complete MRI of right shoulder. Pt was attempted. Pt states she will need pre-medication prior to having her scan. Pt was given instructions and MRI scheduling's number. Pt verbalized understanding and states she will R/S with meds.

## 2023-01-29 ENCOUNTER — Ambulatory Visit: Payer: 59 | Admitting: Family Medicine

## 2023-01-30 ENCOUNTER — Other Ambulatory Visit: Payer: Self-pay

## 2023-01-30 MED ORDER — SUCRALFATE 1 GM/10ML PO SUSP: 1.0000 g | Freq: Four times a day (QID) | ORAL | 0 refills | Status: DC

## 2023-01-31 ENCOUNTER — Other Ambulatory Visit: Payer: Self-pay | Admitting: Family Medicine

## 2023-01-31 DIAGNOSIS — J432 Centrilobular emphysema: Secondary | ICD-10-CM

## 2023-01-31 MED ORDER — ALBUTEROL SULFATE HFA 108 (90 BASE) MCG/ACT IN AERS
1.0000 | INHALATION_SPRAY | Freq: Four times a day (QID) | RESPIRATORY_TRACT | 3 refills | Status: DC | PRN
Start: 2023-01-31 — End: 2023-04-03

## 2023-01-31 NOTE — Telephone Encounter (Signed)
Received fax prescription request for albuterol inhaler Submitted electronic prescription for albuterol 108 mcg inhaler 1-2 puffs as needed every 6 hours 18 g with 3 refills  Ronnald Ramp, MD [place]

## 2023-02-03 ENCOUNTER — Other Ambulatory Visit: Payer: 59 | Admitting: Obstetrics and Gynecology

## 2023-02-03 ENCOUNTER — Encounter: Payer: Self-pay | Admitting: Obstetrics and Gynecology

## 2023-02-03 ENCOUNTER — Ambulatory Visit: Payer: 59

## 2023-02-03 NOTE — Patient Instructions (Signed)
Visit Information  [name] was given information about Medicaid Managed Care team care coordination services as a part of their [place] benefit. Sara Munoz verbally consented to engagement with the [facility] Managed Care team.   If you are experiencing a medical emergency, please call 911 or report to your local emergency department or urgent care.   If you have a non-emergency medical problem during routine business hours, please contact your provider's office and ask to speak with a nurse.   For questions related to your [place] health plan, please call: [phone]  If you would like to schedule transportation through your [place] plan, please call the following number at least 2 days in advance of your appointment: [phone].   There is no limit to the number of trips during the year between medical appointments, healthcare facilities, or pharmacies. Transportation must be scheduled at least 2 business days before but not more than thirty 30 days before of your appointment.  Call the Behavioral Health Crisis Line at [phone], at any time, 24 hours a day, 7 days a week. If you are in danger or need immediate medical attention call 911.  If you would like help to quit smoking, call 1-800-QUIT-NOW (415-265-5490) OR Espaol: 1-855-Djelo-Ya (2-951-884-1660) o para ms informacin haga clic aqu or Text READY to 630-160 to register via text  [name] - following are the goals we discussed in your visit today:  Timeframe:  Long-Range Goal Priority:  High Start Date:       12/20/20                      Expected End Date:   ongoing     Follow up date:  03/12/23  - schedule appointment for flu shot - schedule appointment for vaccines needed due to my age or health - schedule recommended health tests (blood work, mammogram, colonoscopy, pap test) - schedule and keep appointment for annual check-up   Why is this  important?   Screening tests can find diseases early when they are easier to treat.  Your doctor or nurse will talk with you about which tests are important for you.  Getting shots for common diseases like the flu and shingles will help prevent them.   02/03/23:  Upcoming appts with GI, NEURO, PCP, CARDS, and an appt for MRI  Patient verbalizes understanding of instructions and care plan provided today and agrees to view in MyChart. Active MyChart status and patient understanding of how to access instructions and care plan via MyChart confirmed with patient.     The Managed Medicaid care management team will reach out to the patient again over the next 45 business  days.  The  Patient  has been provided with contact information for the Managed Medicaid care management team and has been advised to call with any health related questions or concerns.   Kathi Der RN, BSN [place]  [facility] Care Management Coordinator - Managed Medicaid High Risk [phone]   Following is a copy of your plan of care:  Care Plan : RN Care Manager Plan of Care  Updates made by [name], RN since 02/03/2023 12:00 AM     Problem: Health Promotion or Disease Self-Management (General Plan of Care)      Long-Range Goal: Chronic Disease Management   Start Date: 09/12/2022  Expected End Date: 05/06/2023  Priority: High  Note:   Current Barriers:  Knowledge Deficits related  to plan of care for management of GERD, CAD, HTN, PVD, TIA, migranes, COPD, OSA, tobacco use, osteoarthritis, chronic pain, anxiety  Chronic Disease Management support and education needs related to  GERD, CAD, HTN, PVD, TIA, migranes, COPD, OSA, tobacco use, osteoarthritis, chronic pain, anxiety  02/03/23:  Left shoulder continues to heal-? Torn rotator cuff on right...scheduled for MRI.  Appt 9/6 for evaluation of back pain-? Spinal stimulator.  Made an appt with GI 9/17-increased nausea, hemorrhoids-to have ?  Endoscopy, colonoscopy.  BP stable-smokes 1ppd  RNCM Clinical Goal(s):  Patient will verbalize understanding of plan for management of GERD, CAD, HTN, PVD, TIA, migranes, COPD, OSA, tobacco use, osteoarthritis, chronic pain, anxiety   as evidenced by patient report verbalize basic understanding of  GERD, CAD, HTN, PVD, TIA, migranes, COPD, OSA, tobacco use, osteoarthritis, chronic pain, anxiety  disease process and self health management plan as evidenced by patient report take all medications exactly as prescribed and will call provider for medication related questions as evidenced by patient report  demonstrate understanding of rationale for each prescribed medication as evidenced by patient report attend all scheduled medical appointments as evidenced by patient report and EMR review demonstrate Ongoing adherence to prescribed treatment plan for  GERD, CAD, HTN, PVD, TIA, migranes, COPD, OSA, tobacco use, osteoarthritis, chronic pain, anxiety as evidenced by patient report continue to work with RN Care Manager to address care management and care coordination needs related to GERD, CAD, HTN, PVD, TIA, migranes, COPD, OSA, tobacco use, osteoarthritis, chronic pain, anxiety  as evidenced by adherence to CM Team Scheduled appointments through collaboration with RN Care manager, provider, and care team.   Interventions: Inter-disciplinary care team collaboration (see longitudinal plan of care) Evaluation of current treatment plan related to  self management and patient's adherence to plan as established by provider 01/03/23-discussed protein drink supplementation, MVI  CAD Interventions: (Status:  New goal.) Long Term Goal Assessed understanding of CAD diagnosis Medications reviewed including medications utilized in CAD treatment plan Counseled on importance of regular laboratory monitoring as prescribed Reviewed Importance of taking all medications as prescribed Reviewed Importance of attending  all scheduled provider appointments Advised to report any changes in symptoms or exercise tolerance Screening for signs and symptoms of depression related to chronic disease state Assessed social determinant of health barriers  COPD Interventions:  (Status:  New goal.) Long Term Goal Advised patient to self assesses COPD action plan zone and make appointment with provider if in the yellow zone for 48 hours without improvement Advised patient to engage in light exercise as tolerated 3-5 days a week to aid in the the management of COPD Provided education about and advised patient to utilize infection prevention strategies to reduce risk of respiratory infection Discussed the importance of adequate rest and management of fatigue with COPD Assessed social determinant of health barriers  Hypertension Interventions:  (Status:  New goal.) Long Term Goal Last practice recorded BP readings:  BP Readings from Last 3 Encounters:        01/06/23   118/66  10/23/22         130/78 12/02/22         159/78  Most recent eGFR/CrCl:  Lab Results  Component Value Date   EGFR 83 04/08/2022    No components found for: "CRCL"  Evaluation of current treatment plan related to hypertension self management and patient's adherence to plan as established by provider Reviewed medications with patient and discussed importance of compliance Discussed plans with patient for ongoing  care management follow up and provided patient with direct contact information for care management team Advised patient, providing education and rationale, to monitor blood pressure daily and record, calling PCP for findings outside established parameters Reviewed scheduled/upcoming provider appointments including:  Discussed complications of poorly controlled blood pressure such as heart disease, stroke, circulatory complications, vision complications, kidney impairment, sexual dysfunction Assessed social determinant of health  barriers  Pain Interventions:  (Status:  New goal.) Long Term Goal Pain assessment performed Medications reviewed Reviewed provider established plan for pain management Discussed importance of adherence to all scheduled medical appointments Counseled on the importance of reporting any/all new or changed pain symptoms or management strategies to pain management provider Advised patient to report to care team affect of pain on daily activities Reviewed with patient prescribed pharmacological and nonpharmacological pain relief strategies Assessed social determinant of health barriers  Patient Goals/Self-Care Activities: Take all medications as prescribed Attend all scheduled provider appointments Call pharmacy for medication refills 3-7 days in advance of running out of medications Perform all self care activities independently  Perform IADL's (shopping, preparing meals, housekeeping, managing finances) independently Call provider office for new concerns or questions   Follow Up Plan:  The patient has been provided with contact information for the care management team and has been advised to call with any health related questions or concerns.  The care management team will reach out to the patient again over the next 45 business  days.

## 2023-02-03 NOTE — Patient Outreach (Signed)
Medicaid Managed Care   Nurse Care Manager Note  02/03/2023 Name:  Sara Munoz MRN:  [id number] DOB:  March 19, 1966  Sara Munoz is an 57 y.o. year old female who is a primary patient of [name], Sara Cooler, MD.  The Medicaid Managed Care Coordination team was consulted for assistance with:    Chronic healthcare management needs, GERD, CAD, HTN, tobacco use, COPD, OSA, anxiety, chronic pain, HLD, osteoarthritis  [name] was given information about Medicaid Managed Care Coordination team services today. Sara Munoz Patient agreed to services and verbal consent obtained.  Engaged with patient by telephone for follow up visit in response to provider referral for case management and/or care coordination services.   Assessments/Interventions:  Review of past medical history, allergies, medications, health status, including review of consultants reports, laboratory and other test data, was performed as part of comprehensive evaluation and provision of chronic care management services.  SDOH (Social Determinants of Health) assessments and interventions performed: SDOH Interventions    Flowsheet Row Patient Outreach Telephone from 02/03/2023 in [place] POPULATION HEALTH DEPARTMENT Patient Outreach Telephone from 01/01/2023 in [place] POPULATION HEALTH DEPARTMENT Patient Outreach Telephone from 12/02/2022 in [place] POPULATION HEALTH DEPARTMENT Patient Outreach Telephone from 10/23/2022 in [place] POPULATION HEALTH DEPARTMENT Patient Outreach Telephone from 09/12/2022 in [place] POPULATION HEALTH DEPARTMENT Patient Outreach Telephone from 05/14/2022 in [place] POPULATION HEALTH DEPARTMENT  SDOH Interventions        Food Insecurity Interventions -- -- -- Intervention Not Indicated -- --  Housing Interventions Intervention Not Indicated -- -- -- -- Intervention Not Indicated  Transportation Interventions -- Intervention Not Indicated -- -- -- --  Utilities Interventions --  -- Intervention Not Indicated -- -- --  Alcohol Usage Interventions -- -- Intervention Not Indicated (Score <7) -- -- --  Financial Strain Interventions -- Intervention Not Indicated -- -- -- --  Physical Activity Interventions -- -- -- -- Intervention Not Indicated --  Stress Interventions -- -- -- -- -- Other (Comment)  [sees Psychiatrist and counselor]  Health Literacy Interventions Intervention Not Indicated -- -- -- -- --     Care Plan Allergies  Allergen Reactions   Cyclobenzaprine Hives, Swelling and Rash    Facial/lip swelling      Levofloxacin Hives, Swelling and Rash   Chlorpheniramine Maleate     Other reaction(s): Unknown   Phenergan [Promethazine Hcl] Other (See Comments)    Agitation.    Phenylephrine Hcl Other (See Comments)   Toradol [Ketorolac Tromethamine] Swelling and Other (See Comments)    Facial/tongue swelling    Zoloft [Sertraline Hcl] Swelling    Tongue swelling      Cephalexin Hives and Rash   Ketorolac Rash   Tramadol Hives, Swelling, Other (See Comments) and Rash    Lip swelling    Medications Reviewed Today     Reviewed by [name], RN (Registered Nurse) on 02/03/23 at 1440  Med List Status: <None>   Medication Order Taking? Sig Documenting Provider Last Dose Status Informant  albuterol (PROVENTIL) (2.5 MG/3ML) 0.083% nebulizer solution [id number] No Take 3 mLs (2.5 mg total) by nebulization every 6 (six) hours as needed for wheezing or shortness of breath. [name], [name], MD Taking Active Self  albuterol (VENTOLIN HFA) 108 (90 Base) MCG/ACT inhaler [id number]  Inhale 1-2 puffs into the lungs every 6 (six) hours as needed. INHALE TWO PUFFS BY MOUTH INTO LUNGS EVERY 6 HOURS AS NEEDED FOR SHORTNESS OF BREATH OR wheezing [name], Sara Cooler, MD  Active   amLODipine (NORVASC) 5 MG tablet [id number]  Take 1 tablet (5 mg total) by mouth at bedtime. Jacky Kindle, FNP  Active   aspirin EC 81 MG tablet [id number] No Take 81 mg by mouth  daily. Swallow whole. [provider] Taking Active Self  atorvastatin (LIPITOR) 40 MG tablet [id number] No Take 1 tablet (40 mg total) by mouth every evening. Debbe Odea, MD Taking Active Self  docusate sodium (COLACE) 100 MG capsule [id number] No Take 100 mg by mouth 2 (two) times daily. [provider] Taking Active Self  ELDERBERRY PO [id number] No Take 1 tablet by mouth daily at 6 (six) AM. [provider] Taking Active   gabapentin (NEURONTIN) 600 MG tablet [id number] No Take 600 mg by mouth 4 (four) times daily. [provider] Taking Active Self  lactulose (CHRONULAC) 10 GM/15ML solution [id number] No Take 30 mLs by mouth in the morning, at noon, in the evening, and at bedtime. [provider] Taking Active Self  LINZESS 290 MCG CAPS capsule [id number] No Take 290 mcg by mouth daily as needed (constipation). [provider] Taking Active Self  lisinopril (ZESTRIL) 20 MG tablet [id number]  Take 2 tablets (40 mg total) by mouth daily with lunch. Jacky Kindle, FNP  Active   metoprolol tartrate (LOPRESSOR) 25 MG tablet [id number]  Take 0.5 tablets (12.5 mg total) by mouth 2 (two) times daily. Jacky Kindle, FNP  Active   mometasone (ELOCON) 0.1 % cream [id number] No Apply 1 Application topically daily as needed (hand rash). Poggi, Excell Seltzer, MD Taking Active   mometasone-formoterol [facility]) 100-5 MCG/ACT Sandrea Matte [id number] No Inhale 2 puffs into the lungs every morning. Poggi, Excell Seltzer, MD Taking Active   Multiple Vitamins-Minerals (HAIR SKIN & NAILS PO) [id number] No Take 1 tablet by mouth daily at 6 (six) AM. [provider] Taking Active   naloxone [facility]) 4 MG/0.1ML LIQD nasal spray kit [id number] No Place 1 spray into the nose as needed (opioid overdose). [provider] Taking Active Self           Med Note Richardean Canal May 03, 2021 11:23 AM)    Omega-3 Fatty Acids (OMEGA 3 PO) [id number] No Take 1 capsule by mouth  daily at 6 (six) AM. [provider] Taking Active   omeprazole (PRILOSEC) 40 MG capsule [id number]  Take 1 capsule (40 mg total) by mouth 2 (two) times daily. [name], MD  Active   ondansetron [facility]) 8 MG tablet [id number]  TAKE 1 TABLET BY MOUTH EVERY 8 HOURS AS NEEDED FOR NAUSEA AND VOMITING [name], [name], MD  Active   oxyCODONE (ROXICODONE) 15 MG immediate release tablet [id number] No Take 15 mg by mouth every 4 (four) hours as needed for pain. [provider] Taking Active Self  oxyCODONE (ROXICODONE) 5 MG immediate release tablet [id number] No Take 1-2 tablets (5-10 mg total) by mouth every 4 (four) hours as needed for breakthrough pain.  Patient not taking: Reported on 01/06/2023   Poggi, Excell Seltzer, MD Not Taking Active   sucralfate (CARAFATE) 1 GM/10ML suspension [id number]  Take 10 mLs (1 g total) by mouth 4 (four) times daily. [name], MD  Active   Tiotropium Bromide Monohydrate (SPIRIVA RESPIMAT) 2.5 MCG/ACT AERS [id number]  Take 1 puff by mouth in the morning and at bedtime. Jacky Kindle, FNP  Active   TURMERIC PO [id number] No Take 1 tablet by mouth daily at 6 (six) AM. [provider]  Taking Active            Patient Active Problem List   Diagnosis Date Noted   Chronic fatigue 01/06/2023   Prediabetes 01/06/2023   Atopic dermatitis of both hands 09/12/2022   Headache disorder 08/13/2022   Encounter for annual physical examination excluding gynecological examination in a patient older than 17 years 04/03/2022   Essential hypertension 03/11/2022   Lumbar spondylosis 02/12/2022   Chronic migraine w/o aura w/o status migrainosus, not intractable 07/09/2021   Obstructive sleep apnea 07/09/2021   Body mass index (BMI) 32.0-32.9, adult 06/07/2021   Pseudoarthrosis of cervical spine (HCC) 05/14/2021   Lumbar radiculopathy 05/23/2020   Sacroiliac inflammation (HCC) 11/25/2019   Sepsis (HCC) 10/19/2018   Severe sepsis (HCC) 10/19/2018    Cervical radiculopathy 02/12/2018   Chronic migraine 02/12/2018   Paresthesia 08/29/2017   Neck pain 08/29/2017   Daily headache 08/29/2017   Chronic active hepatitis (HCC) 07/29/2017   Neurogenic pain 07/31/2016   Chronic pain syndrome 06/05/2016   Carotid artery stenosis 02/27/2016   Chronic constipation 02/27/2016   Chronic nausea 02/27/2016   Hypercholesterolemia 02/27/2016   Osteoarthritis 02/27/2016   PTSD (post-traumatic stress disorder) 02/27/2016   TIA (transient ischemic attack) 02/27/2016   Long term current use of opiate analgesic 02/27/2016   Long term prescription opiate use 02/27/2016   Encounter for pain management consult 02/27/2016   Chronic hip pain (Location of Tertiary source of pain) (Bilateral) (L>R) 02/27/2016   Chronic knee pain (Bilateral) (L>R) 02/27/2016   Chronic shoulder pain (Bilateral) (L>R) 02/27/2016   Chronic sacroiliac joint pain (Bilateral) (L>R) 02/27/2016   Chronic low back pain (Location of Primary Source of Pain) (Bilateral) (L>R) 02/27/2016   Chronic lower extremity pain (Location of Secondary source of pain) (Bilateral) (L>R) 02/27/2016   Osteoarthritis of hip (Bilateral) (L>R) 02/27/2016   Chronic neck pain (posterior midline) (Bilateral) (L>R) 02/27/2016   Cervicogenic headache (Bilateral) (L>R) 02/27/2016   Occipital headache (Bilateral) (L>R) 02/27/2016   Chronic upper extremity pain (Bilateral) (L>R) 02/27/2016   Chronic Cervical radicular pain (Bilateral) (L>R) 02/27/2016   Chronic lumbar radicular pain (Right) (L5 dermatome) 02/27/2016   Lumbar facet syndrome (Bilateral) (L>R) 02/27/2016   Long term prescription benzodiazepine use 02/27/2016   Hypertension    Chronic obstructive pulmonary disease (HCC) 10/09/2015   GERD (gastroesophageal reflux disease) 10/09/2015   Non-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/2014 08/02/2015   Hyperlipidemia 08/02/2015   Costochondritis 08/02/2015   Drug abuse, 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   GAD (generalized  anxiety disorder) 09/03/2014   Narcotic dependence (HCC) 09/03/2014   PVD (peripheral vascular disease) (HCC) 09/03/2014   Smoker 09/03/2014   Cigarette nicotine dependence with nicotine-induced disorder 07/08/2014   Anemia of chronic disease 03/19/2014   Narcotic abuse, continuous (HCC) 03/19/2014   Polysubstance abuse (HCC) 03/19/2014   MSSA (methicillin susceptible Staphylococcus aureus) septicemia (HCC) 03/07/2014   Hypomagnesemia 01/04/2014   Chronic cough 09/04/2011   Sleep apnea, obstructive 09/04/2011   Sinusitis, acute 09/04/2011   Conditions to be addressed/monitored per PCP order:  Chronic healthcare management needs, GERD, CAD, HTN, tobacco use, COPD, OSA, anxiety, chronic pain, HLD, osteoarthritis  Care Plan : RN Care Manager Plan of Care  Updates made by [name], RN since 02/03/2023 12:00 AM     Problem: Health Promotion or Disease Self-Management (General Plan of Care)      Long-Range Goal: Chronic Disease Management   Start Date: 09/12/2022  Expected End Date: 05/06/2023  Priority: High  Note:   Current Barriers:  Knowledge Deficits related to plan of care for management of GERD, CAD, HTN, PVD, TIA, migranes, COPD, OSA, tobacco use, osteoarthritis, chronic pain, anxiety  Chronic Disease Management support and education needs related to  GERD, CAD, HTN, PVD, TIA, migranes, COPD, OSA, tobacco use, osteoarthritis, chronic pain, anxiety  02/03/23:  Left shoulder continues to heal-? Torn rotator cuff on right...scheduled for MRI.  Appt 9/6 for evaluation of back pain-? Spinal stimulator.  Made an appt with GI 9/17-increased nausea, hemorrhoids-to have ? Endoscopy, colonoscopy.  BP stable-smokes 1ppd  RNCM Clinical Goal(s):  Patient will verbalize understanding of plan for management of GERD, CAD, HTN, PVD, TIA, migranes, COPD, OSA, tobacco use, osteoarthritis, chronic pain, anxiety   as evidenced by patient report verbalize basic understanding of  GERD, CAD, HTN,  PVD, TIA, migranes, COPD, OSA, tobacco use, osteoarthritis, chronic pain, anxiety  disease process and self health management plan as evidenced by patient report take all medications exactly as prescribed and will call provider for medication related questions as evidenced by patient report  demonstrate understanding of rationale for each prescribed medication as evidenced by patient report attend all scheduled medical appointments as evidenced by patient report and EMR review demonstrate Ongoing adherence to prescribed treatment plan for  GERD, CAD, HTN, PVD, TIA, migranes, COPD, OSA, tobacco use, osteoarthritis, chronic pain, anxiety as evidenced by patient report continue to work with RN Care Manager to address care management and care coordination needs related to GERD, CAD, HTN, PVD, TIA, migranes, COPD, OSA, tobacco use, osteoarthritis, chronic pain, anxiety  as evidenced by adherence to CM Team Scheduled appointments through collaboration with RN Care manager, provider, and care team.   Interventions: Inter-disciplinary care team collaboration (see longitudinal plan of care) Evaluation of current treatment plan related to  self management and patient's adherence to plan as established by provider 01/03/23-discussed protein drink supplementation, MVI  CAD Interventions: (Status:  New goal.) Long Term Goal Assessed understanding of CAD diagnosis Medications reviewed including medications utilized in CAD treatment plan Counseled on importance of regular laboratory monitoring as prescribed Reviewed Importance of taking all medications as prescribed Reviewed Importance of attending all scheduled provider appointments Advised to report any changes in symptoms or exercise tolerance Screening for signs and symptoms of depression related to chronic disease state Assessed social determinant of health barriers  COPD Interventions:  (Status:  New goal.) Long Term Goal Advised patient to self  assesses COPD action plan zone and make appointment with provider if in the yellow zone for 48 hours without improvement Advised patient to engage in light exercise as tolerated 3-5 days a week to aid in the the management of COPD Provided education about and advised patient to utilize infection prevention strategies to reduce risk of respiratory infection Discussed the importance of adequate rest and management of fatigue with COPD Assessed social determinant of health barriers  Hypertension Interventions:  (Status:  New goal.) Long Term Goal Last practice recorded BP readings:  BP Readings from Last 3 Encounters:        01/06/23   118/66  10/23/22         130/78 12/02/22         159/78  Most recent eGFR/CrCl:  Lab Results  Component Value Date   EGFR 83 04/08/2022    No components found for: "CRCL"  Evaluation of current treatment plan related to hypertension self management and patient's adherence to plan as established by provider Reviewed medications with patient and discussed importance of compliance Discussed plans with  patient for ongoing care management follow up and provided patient with direct contact information for care management team Advised patient, providing education and rationale, to monitor blood pressure daily and record, calling PCP for findings outside established parameters Reviewed scheduled/upcoming provider appointments including:  Discussed complications of poorly controlled blood pressure such as heart disease, stroke, circulatory complications, vision complications, kidney impairment, sexual dysfunction Assessed social determinant of health barriers  Pain Interventions:  (Status:  New goal.) Long Term Goal Pain assessment performed Medications reviewed Reviewed provider established plan for pain management Discussed importance of adherence to all scheduled medical appointments Counseled on the importance of reporting any/all new or changed pain symptoms  or management strategies to pain management provider Advised patient to report to care team affect of pain on daily activities Reviewed with patient prescribed pharmacological and nonpharmacological pain relief strategies Assessed social determinant of health barriers  Patient Goals/Self-Care Activities: Take all medications as prescribed Attend all scheduled provider appointments Call pharmacy for medication refills 3-7 days in advance of running out of medications Perform all self care activities independently  Perform IADL's (shopping, preparing meals, housekeeping, managing finances) independently Call provider office for new concerns or questions   Follow Up Plan:  The patient has been provided with contact information for the care management team and has been advised to call with any health related questions or concerns.  The care management team will reach out to the patient again over the next 45 business  days.   Follow Up:  Patient agrees to Care Plan and Follow-up.  Plan: The Managed Medicaid care management team will reach out to the patient again over the next 30 business  days. and The  Patient has been provided with contact information for the Managed Medicaid care management team and has been advised to call with any health related questions or concerns.  Date/time of next scheduled RN care management/care coordination outreach: 03/11/22 at 1230.

## 2023-02-04 ENCOUNTER — Other Ambulatory Visit: Payer: Self-pay | Admitting: Family Medicine

## 2023-02-04 DIAGNOSIS — R110 Nausea: Secondary | ICD-10-CM

## 2023-02-05 NOTE — Progress Notes (Deleted)
NEUROLOGY CONSULTATION NOTE  Sara Munoz MRN: [id number] DOB: 11-Feb-1966  Referring provider: Ronnald Ramp, MD Primary care provider: Ronnald Ramp, MD  Reason for consult:  headache  Assessment/Plan:   ***   Subjective:  Sara Munoz is a 57 year old ***-handed female with COPD, CAD, HTN, HLD, carotid artery disease, tobacco use disorder, hepatic steatosis, asthma, cervical and lumbar spondylosis s/p ACDF C5-7, PSIF C5-T1 and L5-S1 PLIF, PTSD and history of hepatitis C and TIA who presents for migraines.  History supplemented by referring provider's note.  She has had frequent headaches since at least 2018, accompanied by worsening neck pain.  She had episodes of syncope at that time, as well.  Seen neurology in 2019.  MRI of brain ***.  MRI of cervical spine revealed central disc protrusion at C6 and 7 with mild-moderate canal stenosis causing cord flattening and bilateral (L>R) C7 foraminal narrowing.  NCV-EMG revealed mild bilateral C7 radiculopathy.  Failed pharmacologic therapy (NSAIDs, gabapentin, opioids), physical therapy and epidural injections.  Underwent laminectomy and posterior instrumented fusion of C5-T1 in November 2022.  Neck pain improved but continued to have daily headache.  She was also found to have OSA and ***.  She wakes up in the morning with headache.  It is daily and constant/persistent.  She has bilateral ear pain with ringing.  There is associated blurred vision.  She has chronic nausea, so uncertain if she has nausea specifically associated with headaches.    Imaging (personally reviewed): 09/11/2017 MRI BRAIN WO:  No acute intracranial abnormality. Partial empty sella, uncertain significance. Otherwise unremarkable MRI of the brain. 09/11/2017 MRI C-SPINE:  Central disc extrusion at C6-7 with moderate stenosis and cord flattening. Canal diameter 6 mm. No abnormal cord signal, but LEFT greater than RIGHT C7 foraminal narrowing is  noted.  Central and rightward protrusion at C5-6 with mild stenosis and cord flattening. RIGHT C6 foraminal narrowing is seen. 04/26/2021 MRI C-SPINE:  1. Status post ACDF C5-C7. Osseous hypertrophy at these levels causes moderate spinal canal stenosis and moderate bilateral neural foraminal narrowing at C6-C7 and mild spinal canal stenosis and mild bilateral neural foraminal narrowing at C5-C6, both of which have progressed from the prior MRI.  2. C4-C5 mild left neural foraminal narrowing, which is new from the exam. 12/12/2021 CT HEAD:  1. No evidence of acute intracranial abnormality. 2. Partially empty sella turcica. While this finding often reflects incidental anatomic variation, it can also be associated with idiopathic intracranial hypertension (pseudotumor cerebri). 12/12/2021 CT C-SPINE:  1. No evidence of acute fracture to the cervical spine. 2. Nonspecific reversal of the expected cervical lordosis. 3. Mild grade 1 anterolisthesis at C3-C4 and C4-C5.  4.   Prior C5-C7 ACDF. Prior posterior decompression at C5 and C6. A posterior spinal fusion construct spans the C5-T1 levels. No evidence of acute hardware compromise.  Past NSAIDS/analgesics:  oxycodone, hydrocodone, morphine, ibuprofen, naproxen, etodolac, meloxicam Past abortive triptans:  *** Past abortive ergotamine:  *** Past muscle relaxants:  Flexeril, Robaxin, Zanaflex Past anti-emetic:  none Past antihypertensive medications:  diltiazem Past antidepressant medications:  Venlafaxin, duloxetine, mirtazapine, Wellbutrin Past anticonvulsant medications:  *** Past anti-CGRP:  Aimovig 140mg , Bernita Raisin Past vitamins/Herbal/Supplements:  magnesium oxide Past antihistamines/decongestants:  *** Other past therapies:  ***  Current NSAIDS/analgesics:  ASA 81mg  daily Current triptans:  none Current ergotamine:  none Current anti-emetic:  Zofran 8mg  Current muscle relaxants:  none Current Antihypertensive medications:  metoprolol  tartrate, lisinopril, amlodipine Current Antidepressant medications:  none Current Anticonvulsant medications:  gabapentin 600mg  four times daily Current anti-CGRP:  *** Current Vitamins/Herbal/Supplements:  MVI, turmeric Current Antihistamines/Decongestants:  *** Other therapy:  *** Other medications:  ***   Caffeine:  *** Alcohol:  *** Smoker:  *** Diet:  *** Exercise:  *** Depression:  ***; Anxiety:  *** Other pain:  *** Sleep hygiene:  *** Family history of headache:  ***      PAST MEDICAL HISTORY: Past Medical History:  Diagnosis Date   Anemia    Anxiety    Aortic atherosclerosis (HCC)    Asthma    Atypical chest pain 08/02/2015   Carotid artery disease (HCC)    Chronic back pain    Chronic nausea    Chronic, continuous use of opioids 02/27/2016   a.) has naloxone Rx available   COPD (chronic obstructive pulmonary disease) (HCC)    Coronary artery disease    a.) LHC 2011: no sig CAD (report unavailable); b.) LHC 07/07/2014: 10-20% mLM, 25% LAD, 30-40% LCx, 20-30% RCA -- med mgmt; c.) LHC 02/22/2016: 25% LM, 25% pLCx --> med mgmt; d.) MPI 07/10/2017: no ischemia; e.) MPI 09/01/2019: no ischemia; f.) cCTA 10/28/2022: Ca score 1092 (99th percentile for age/sex/race match control)   Current smoker    DDD (degenerative disc disease), cervical    a.) s/p ACDF C5-C7 and PSIF C5-T1   DDD (degenerative disc disease), lumbar 05/23/2020   a.) s/p L5-S1 PLIF   GERD (gastroesophageal reflux disease)    Hepatic steatosis    Hepatitis C virus infection cured after antiviral drug therapy    a.) s/p Tx with standard glecaprevir-pibrentasvir course   Hypercholesteremia    Hyperkalemia    Hypertension    Hypokalemia    Hypomagnesemia 01/04/2014   Incomplete tear of left rotator cuff    MI (myocardial infarction) (HCC)    Migraines    MSSA (methicillin susceptible Staphylococcus aureus) septicemia (HCC) 03/03/2014   a.) TEE (-) for valvular vegitation   Nerve pain    per  patient, in the lower back   Osteoarthritis    Pneumonia    PTSD (post-traumatic stress disorder)    Tendinitis of left rotator cuff    TIA (transient ischemic attack)     PAST SURGICAL HISTORY: Past Surgical History:  Procedure Laterality Date   ANTERIOR CERVICAL DECOMP/DISCECTOMY FUSION N/A 03/30/2018   Procedure: Cervical six-seven Anterior cervical decompression/discectomy/fusion;  Munoz: Jadene Pierini, MD;  Location: [facility] OR;  Service: Neurosurgery;  Laterality: N/A;   ANTERIOR CERVICAL DECOMP/DISCECTOMY FUSION N/A 01/11/2019   Procedure: Cervical Five-Six Anterior cervical discectomy fusion,  Cervical Five to Cervical Seven anterior instrumented fusion;  Munoz: Jadene Pierini, MD;  Location: [facility] OR;  Service: Neurosurgery;  Laterality: N/A;  Cervical Five-Six Anterior cervical discectomy fusion,  Cervical Five to Cervical Seven anterior instrumented fusion   CARDIAC CATHETERIZATION Left 02/22/2016   Procedure: Left Heart Cath and Coronary Angiography;  Munoz: Alwyn Pea, MD;  Location: [facility] INVASIVE CV LAB;  Service: Cardiovascular;  Laterality: Left;   CATARACT EXTRACTION W/ INTRAOCULAR LENS IMPLANT Right    COLONOSCOPY WITH PROPOFOL N/A 09/19/2016   Procedure: COLONOSCOPY WITH PROPOFOL;  Munoz: [name], MD;  Location: [facility] ENDOSCOPY;  Service: Endoscopy;  Laterality: N/A;   DIAGNOSTIC LAPAROSCOPY     ESOPHAGOGASTRODUODENOSCOPY (EGD) WITH PROPOFOL N/A 09/18/2017   Procedure: ESOPHAGOGASTRODUODENOSCOPY (EGD) WITH PROPOFOL;  Munoz: [name], MD;  Location[facility] ENDOSCOPY;  Service: Gastroenterology;  Laterality: N/A;   ESOPHAGOGASTRODUODENOSCOPY (EGD) WITH PROPOFOL N/A 08/10/2019   Procedure: ESOPHAGOGASTRODUODENOSCOPY (EGD) WITH  PROPOFOL;  Munoz: [name], MD;  Location: [facility] ENDOSCOPY;  Service: Endoscopy;  Laterality: N/A;   HEMORRHOID SURGERY     LEFT HEART CATH AND CORONARY ANGIOGRAPHY Left 07/07/2014   LEFT HEART CATH AND CORONARY ANGIOGRAPHY  Left 2011   LUMBAR WOUND DEBRIDEMENT N/A 11/05/2022   Procedure: Thoracic one spinous process removal and wound revision;  Munoz: Jadene Pierini, MD;  Location: [facility] OR;  Service: Neurosurgery;  Laterality: N/A;   NASAL SINUS SURGERY     ORIF FEMUR FRACTURE Left    ORIF TIBIA & FIBULA FRACTURES Right    OVARIAN CYST SURGERY     POSTERIOR CERVICAL FUSION/FORAMINOTOMY N/A 05/14/2021   Procedure: Cervical five, Cervical six Laminectomy,foraminotomy with Cervical five-six Cervical six-seven Cervical seven-Thoracic one Posterior instrumented fusion;  Munoz: Jadene Pierini, MD;  Location: [facility] OR;  Service: Neurosurgery;  Laterality: N/A;   SHOULDER ARTHROSCOPY WITH SUBACROMIAL DECOMPRESSION, ROTATOR CUFF REPAIR AND BICEP TENDON REPAIR Left 05/28/2022   Procedure: SHOULDER ARTHROSCOPY WITH DEBRIDEMENT, DECOMPRESSION, ROTATOR CUFF REPAIR AND BICEP TENDON REPAIR;  Munoz: Christena Flake, MD;  Location: [facility] ORS;  Service: Orthopedics;  Laterality: Left;   SHOULDER ARTHROSCOPY WITH SUBACROMIAL DECOMPRESSION, ROTATOR CUFF REPAIR AND BICEP TENDON REPAIR Left 12/03/2022   Procedure: SHOULDER Extensive ARTHROSCOPY WITH DEBRIDEMENT, removal of Retained Foreign Body;  Munoz: Christena Flake, MD;  Location: [facility] ORS;  Service: Orthopedics;  Laterality: Left;   TRANSFORAMINAL LUMBAR INTERBODY FUSION W/ MIS 1 LEVEL Right 05/23/2020   Procedure: Right Lumbar Five Sacral One Minimally invasive transforaminal lumbar interbody fusion;  Munoz: Jadene Pierini, MD;  Location: [facility] OR;  Service: Neurosurgery;  Laterality: Right;  Right Lumbar Five Sacral One Minimally invasive transforaminal lumbar interbody fusion    MEDICATIONS: Current Outpatient Medications on File Prior to Visit  Medication Sig Dispense Refill   albuterol (PROVENTIL) (2.5 MG/3ML) 0.083% nebulizer solution Take 3 mLs (2.5 mg total) by nebulization every 6 (six) hours as needed for wheezing or shortness of breath. 75 mL 12   albuterol  (VENTOLIN HFA) 108 (90 Base) MCG/ACT inhaler Inhale 1-2 puffs into the lungs every 6 (six) hours as needed. INHALE TWO PUFFS BY MOUTH INTO LUNGS EVERY 6 HOURS AS NEEDED FOR SHORTNESS OF BREATH OR wheezing 18 g 3   amLODipine (NORVASC) 5 MG tablet Take 1 tablet (5 mg total) by mouth at bedtime. 90 tablet 0   aspirin EC 81 MG tablet Take 81 mg by mouth daily. Swallow whole.     atorvastatin (LIPITOR) 40 MG tablet Take 1 tablet (40 mg total) by mouth every evening. 90 tablet 3   docusate sodium (COLACE) 100 MG capsule Take 100 mg by mouth 2 (two) times daily.     ELDERBERRY PO Take 1 tablet by mouth daily at 6 (six) AM.     gabapentin (NEURONTIN) 600 MG tablet Take 600 mg by mouth 4 (four) times daily.     lactulose (CHRONULAC) 10 GM/15ML solution Take 30 mLs by mouth in the morning, at noon, in the evening, and at bedtime.     LINZESS 290 MCG CAPS capsule Take 290 mcg by mouth daily as needed (constipation).     lisinopril (ZESTRIL) 20 MG tablet Take 2 tablets (40 mg total) by mouth daily with lunch. 180 tablet 1   metoprolol tartrate (LOPRESSOR) 25 MG tablet Take 0.5 tablets (12.5 mg total) by mouth 2 (two) times daily. 90 tablet 1   mometasone (ELOCON) 0.1 % cream Apply 1 Application topically daily as needed (hand  rash).     mometasone-formoterol (DULERA) 100-5 MCG/ACT AERO Inhale 2 puffs into the lungs every morning.     Multiple Vitamins-Minerals (HAIR SKIN & NAILS PO) Take 1 tablet by mouth daily at 6 (six) AM.     naloxone (NARCAN) 4 MG/0.1ML LIQD nasal spray kit Place 1 spray into the nose as needed (opioid overdose).     Omega-3 Fatty Acids (OMEGA 3 PO) Take 1 capsule by mouth daily at 6 (six) AM.     omeprazole (PRILOSEC) 40 MG capsule Take 1 capsule (40 mg total) by mouth 2 (two) times daily. 180 capsule 3   ondansetron (ZOFRAN) 8 MG tablet TAKE 1 TABLET BY MOUTH EVERY 8 HOURS AS NEEDED FOR NAUSEA AND VOMITING 90 tablet 0   oxyCODONE (ROXICODONE) 15 MG immediate release tablet Take 15 mg  by mouth every 4 (four) hours as needed for pain.     oxyCODONE (ROXICODONE) 5 MG immediate release tablet Take 1-2 tablets (5-10 mg total) by mouth every 4 (four) hours as needed for breakthrough pain. (Patient not taking: Reported on 01/06/2023) 40 tablet 0   sucralfate (CARAFATE) 1 GM/10ML suspension Take 10 mLs (1 g total) by mouth 4 (four) times daily. 840 mL 0   Tiotropium Bromide Monohydrate (SPIRIVA RESPIMAT) 2.5 MCG/ACT AERS Take 1 puff by mouth in the morning and at bedtime. 12 g 11   TURMERIC PO Take 1 tablet by mouth daily at 6 (six) AM.     No current facility-administered medications on file prior to visit.    ALLERGIES: Allergies  Allergen Reactions   Cyclobenzaprine Hives, Swelling and Rash    Facial/lip swelling      Levofloxacin Hives, Swelling and Rash   Chlorpheniramine Maleate     Other reaction(s): Unknown   Phenergan [Promethazine Hcl] Other (See Comments)    Agitation.    Phenylephrine Hcl Other (See Comments)   Toradol [Ketorolac Tromethamine] Swelling and Other (See Comments)    Facial/tongue swelling    Zoloft [Sertraline Hcl] Swelling    Tongue swelling      Cephalexin Hives and Rash   Ketorolac Rash   Tramadol Hives, Swelling, Other (See Comments) and Rash    Lip swelling     FAMILY HISTORY: Family History  Problem Relation Age of Onset   Heart disease Mother    Lung cancer Mother    Ovarian cancer Mother    Healthy Brother    Healthy Brother    Healthy Brother    Diabetes Maternal Uncle    Breast cancer Maternal Aunt     Objective:  *** General: No acute distress.  Patient appears well-groomed.   Head:  Normocephalic/atraumatic Eyes:  fundi examined but not visualized Neck: supple, no paraspinal tenderness, full range of motion Back: No paraspinal tenderness Heart: regular rate and rhythm Lungs: Clear to auscultation bilaterally. Vascular: No carotid bruits. Neurological Exam: Mental status: alert and oriented to person, place, and  time, speech fluent and not dysarthric, language intact. Cranial nerves: CN I: not tested CN II: pupils equal, round and reactive to light, visual fields intact CN III, IV, VI:  full range of motion, no nystagmus, no ptosis CN V: facial sensation intact. CN VII: upper and lower face symmetric CN VIII: hearing intact CN IX, X: gag intact, uvula midline CN XI: sternocleidomastoid and trapezius muscles intact CN XII: tongue midline Bulk & Tone: normal, no fasciculations. Motor:  muscle strength 5/5 throughout Sensation:  Pinprick, temperature and vibratory sensation intact. Deep Tendon Reflexes:  2+ throughout,  toes downgoing.   Finger to nose testing:  Without dysmetria.   Heel to shin:  Without dysmetria.   Gait:  Normal station and stride.  Romberg negative.    Thank you for allowing me to take part in the care of this patient.  Shon Millet, DO  CC: ***

## 2023-02-06 ENCOUNTER — Ambulatory Visit: Payer: 59 | Admitting: Neurology

## 2023-02-12 ENCOUNTER — Ambulatory Visit: Payer: 59 | Admitting: Family Medicine

## 2023-02-15 ENCOUNTER — Other Ambulatory Visit: Payer: Self-pay | Admitting: Gastroenterology

## 2023-02-21 ENCOUNTER — Ambulatory Visit
Admission: RE | Admit: 2023-02-21 | Discharge: 2023-02-21 | Disposition: A | Discharge status: Home / Self Care | Payer: 59 | Source: Ambulatory Visit | Attending: Surgery | Admitting: Surgery

## 2023-02-21 DIAGNOSIS — M7521 Bicipital tendinitis, right shoulder: Secondary | ICD-10-CM | POA: Insufficient documentation

## 2023-02-21 DIAGNOSIS — M7581 Other shoulder lesions, right shoulder: Secondary | ICD-10-CM | POA: Diagnosis present

## 2023-02-25 NOTE — Progress Notes (Deleted)
Established patient visit   Patient: Sara Munoz   DOB: 10/07/1965   57 y.o. Female  MRN: [id number] Visit Date: 02/26/2023  Today's healthcare provider: Ronnald Ramp, MD   No chief complaint on file.  Subjective      HTN  Changed metoprolol to 12.5mg  twice daily, amlodipine 5mg  every day, lisinopril 20mg  every day   Medications: Outpatient Medications Prior to Visit  Medication Sig  . albuterol (PROVENTIL) (2.5 MG/3ML) 0.083% nebulizer solution Take 3 mLs (2.5 mg total) by nebulization every 6 (six) hours as needed for wheezing or shortness of breath.  Marland Kitchen albuterol (VENTOLIN HFA) 108 (90 Base) MCG/ACT inhaler Inhale 1-2 puffs into the lungs every 6 (six) hours as needed. INHALE TWO PUFFS BY MOUTH INTO LUNGS EVERY 6 HOURS AS NEEDED FOR SHORTNESS OF BREATH OR wheezing  . amLODipine (NORVASC) 5 MG tablet Take 1 tablet (5 mg total) by mouth at bedtime.  Marland Kitchen aspirin EC 81 MG tablet Take 81 mg by mouth daily. Swallow whole.  Marland Kitchen atorvastatin (LIPITOR) 40 MG tablet Take 1 tablet (40 mg total) by mouth every evening.  . docusate sodium (COLACE) 100 MG capsule Take 100 mg by mouth 2 (two) times daily.  Marland Kitchen ELDERBERRY PO Take 1 tablet by mouth daily at 6 (six) AM.  . gabapentin (NEURONTIN) 600 MG tablet Take 600 mg by mouth 4 (four) times daily.  Marland Kitchen lactulose (CHRONULAC) 10 GM/15ML solution Take 30 mLs by mouth in the morning, at noon, in the evening, and at bedtime.  Marland Kitchen LINZESS 290 MCG CAPS capsule Take 290 mcg by mouth daily as needed (constipation).  Marland Kitchen lisinopril (ZESTRIL) 20 MG tablet Take 2 tablets (40 mg total) by mouth daily with lunch.  . metoprolol tartrate (LOPRESSOR) 25 MG tablet Take 0.5 tablets (12.5 mg total) by mouth 2 (two) times daily.  . mometasone (ELOCON) 0.1 % cream Apply 1 Application topically daily as needed (hand rash).  . mometasone-formoterol (DULERA) 100-5 MCG/ACT AERO Inhale 2 puffs into the lungs every morning.  . Multiple Vitamins-Minerals (HAIR  SKIN & NAILS PO) Take 1 tablet by mouth daily at 6 (six) AM.  . naloxone (NARCAN) 4 MG/0.1ML LIQD nasal spray kit Place 1 spray into the nose as needed (opioid overdose).  . Omega-3 Fatty Acids (OMEGA 3 PO) Take 1 capsule by mouth daily at 6 (six) AM.  . omeprazole (PRILOSEC) 40 MG capsule TAKE 1 CAPSULE BY MOUTH TWICE A DAY  . ondansetron (ZOFRAN) 8 MG tablet TAKE 1 TABLET BY MOUTH EVERY 8 HOURS AS NEEDED FOR NAUSEA AND VOMITING  . oxyCODONE (ROXICODONE) 15 MG immediate release tablet Take 15 mg by mouth every 4 (four) hours as needed for pain.  Marland Kitchen oxyCODONE (ROXICODONE) 5 MG immediate release tablet Take 1-2 tablets (5-10 mg total) by mouth every 4 (four) hours as needed for breakthrough pain. (Patient not taking: Reported on 01/06/2023)  . sucralfate (CARAFATE) 1 GM/10ML suspension Take 10 mLs (1 g total) by mouth 4 (four) times daily.  . Tiotropium Bromide Monohydrate (SPIRIVA RESPIMAT) 2.5 MCG/ACT AERS Take 1 puff by mouth in the morning and at bedtime.  . TURMERIC PO Take 1 tablet by mouth daily at 6 (six) AM.   No facility-administered medications prior to visit.    Review of Systems  {Insert previous labs (optional):23779}  {See past labs  Heme  Chem  Endocrine  Serology  Results Review (optional):1}   Objective    LMP 12/16/2017 (Approximate)  {Insert last BP/Wt (optional):23777}  {  See vitals history (optional):1}  Physical Exam  ***  No results found for any visits on 02/26/23.  Assessment & Plan     Problem List Items Addressed This Visit   None    No follow-ups on file.         Ronnald Ramp, MD  [facility] [phone] (fax)  [facility]

## 2023-02-26 ENCOUNTER — Ambulatory Visit: Payer: 59 | Admitting: Family Medicine

## 2023-02-28 NOTE — Progress Notes (Deleted)
Referring Physician:  No referring provider defined for this encounter.  Primary Physician:  Ronnald Ramp, MD  History of Present Illness: 02/28/2023 [name] is here today with a chief complaint of ***   SCS trial with [employer] from 12/06/22 to 12/12/22 with greater than 50% relief    Conservative measures:   Psysh clearance on 09/15/22  Physical therapy: ***  Multimodal medical therapy including regular antiinflammatories: ***  Injections: *** epidural steroid injections  Past Surgery: ***  Sara Munoz has ***no symptoms of cervical myelopathy.  The symptoms are causing a significant impact on the patient's life.   I have utilized the care everywhere function in epic to review the outside records available from external health systems.  Review of Systems:  A 10 point review of systems is negative, except for the pertinent positives and negatives detailed in the HPI.  Past Medical History: Past Medical History:  Diagnosis Date   Anemia    Anxiety    Aortic atherosclerosis (HCC)    Asthma    Atypical chest pain 08/02/2015   Carotid artery disease (HCC)    Chronic back pain    Chronic nausea    Chronic, continuous use of opioids 02/27/2016   a.) has naloxone Rx available   COPD (chronic obstructive pulmonary disease) (HCC)    Coronary artery disease    a.) LHC 2011: no sig CAD (report unavailable); b.) LHC 07/07/2014: 10-20% mLM, 25% LAD, 30-40% LCx, 20-30% RCA -- med mgmt; c.) LHC 02/22/2016: 25% LM, 25% pLCx --> med mgmt; d.) MPI 07/10/2017: no ischemia; e.) MPI 09/01/2019: no ischemia; f.) cCTA 10/28/2022: Ca score 1092 (99th percentile for age/sex/race match control)   Current smoker    DDD (degenerative disc disease), cervical    a.) s/p ACDF C5-C7 and PSIF C5-T1   DDD (degenerative disc disease), lumbar 05/23/2020   a.) s/p L5-S1 PLIF   GERD (gastroesophageal reflux disease)    Hepatic steatosis    Hepatitis C virus  infection cured after antiviral drug therapy    a.) s/p Tx with standard glecaprevir-pibrentasvir course   Hypercholesteremia    Hyperkalemia    Hypertension    Hypokalemia    Hypomagnesemia 01/04/2014   Incomplete tear of left rotator cuff    MI (myocardial infarction) (HCC)    Migraines    MSSA (methicillin susceptible Staphylococcus aureus) septicemia (HCC) 03/03/2014   a.) TEE (-) for valvular vegitation   Nerve pain    per patient, in the lower back   Osteoarthritis    Pneumonia    PTSD (post-traumatic stress disorder)    Tendinitis of left rotator cuff    TIA (transient ischemic attack)     Past Surgical History: Past Surgical History:  Procedure Laterality Date   ANTERIOR CERVICAL DECOMP/DISCECTOMY FUSION N/A 03/30/2018   Procedure: Cervical six-seven Anterior cervical decompression/discectomy/fusion;  Surgeon: Jadene Pierini, MD;  Location: [facility];  Service: Neurosurgery;  Laterality: N/A;   ANTERIOR CERVICAL DECOMP/DISCECTOMY FUSION N/A 01/11/2019   Procedure: Cervical Five-Six Anterior cervical discectomy fusion,  Cervical Five to Cervical Seven anterior instrumented fusion;  Surgeon: Jadene Pierini, MD;  Location: [facility] OR;  Service: Neurosurgery;  Laterality: N/A;  Cervical Five-Six Anterior cervical discectomy fusion,  Cervical Five to Cervical Seven anterior instrumented fusion   CARDIAC CATHETERIZATION Left 02/22/2016   Procedure: Left Heart Cath and Coronary Angiography;  Surgeon: Alwyn Pea, MD;  Location: [facility] INVASIVE CV LAB;  Service: Cardiovascular;  Laterality: Left;  CATARACT EXTRACTION W/ INTRAOCULAR LENS IMPLANT Right    COLONOSCOPY WITH PROPOFOL N/A 09/19/2016   Procedure: COLONOSCOPY WITH PROPOFOL;  Surgeon: [name], MD;  Location: [facility] ENDOSCOPY;  Service: Endoscopy;  Laterality: N/A;   DIAGNOSTIC LAPAROSCOPY     ESOPHAGOGASTRODUODENOSCOPY (EGD) WITH PROPOFOL N/A 09/18/2017   Procedure: ESOPHAGOGASTRODUODENOSCOPY (EGD) WITH PROPOFOL;   Surgeon: [name], MD;  Location: [facility] ENDOSCOPY;  Service: Gastroenterology;  Laterality: N/A;   ESOPHAGOGASTRODUODENOSCOPY (EGD) WITH PROPOFOL N/A 08/10/2019   Procedure: ESOPHAGOGASTRODUODENOSCOPY (EGD) WITH PROPOFOL;  Surgeon: [name], MD;  Location: [facility] ENDOSCOPY;  Service: Endoscopy;  Laterality: N/A;   HEMORRHOID SURGERY     LEFT HEART CATH AND CORONARY ANGIOGRAPHY Left 07/07/2014   LEFT HEART CATH AND CORONARY ANGIOGRAPHY Left 2011   LUMBAR WOUND DEBRIDEMENT N/A 11/05/2022   Procedure: Thoracic one spinous process removal and wound revision;  Surgeon: Jadene Pierini, MD;  Location: [facility] OR;  Service: Neurosurgery;  Laterality: N/A;   NASAL SINUS SURGERY     ORIF FEMUR FRACTURE Left    ORIF TIBIA & FIBULA FRACTURES Right    OVARIAN CYST SURGERY     POSTERIOR CERVICAL FUSION/FORAMINOTOMY N/A 05/14/2021   Procedure: Cervical five, Cervical six Laminectomy,foraminotomy with Cervical five-six Cervical six-seven Cervical seven-Thoracic one Posterior instrumented fusion;  Surgeon: Jadene Pierini, MD;  Location: [facility] OR;  Service: Neurosurgery;  Laterality: N/A;   SHOULDER ARTHROSCOPY WITH SUBACROMIAL DECOMPRESSION, ROTATOR CUFF REPAIR AND BICEP TENDON REPAIR Left 05/28/2022   Procedure: SHOULDER ARTHROSCOPY WITH DEBRIDEMENT, DECOMPRESSION, ROTATOR CUFF REPAIR AND BICEP TENDON REPAIR;  Surgeon: Christena Flake, MD;  Location: [facility] ORS;  Service: Orthopedics;  Laterality: Left;   SHOULDER ARTHROSCOPY WITH SUBACROMIAL DECOMPRESSION, ROTATOR CUFF REPAIR AND BICEP TENDON REPAIR Left 12/03/2022   Procedure: SHOULDER Extensive ARTHROSCOPY WITH DEBRIDEMENT, removal of Retained Foreign Body;  Surgeon: Christena Flake, MD;  Location: [facility] ORS;  Service: Orthopedics;  Laterality: Left;   TRANSFORAMINAL LUMBAR INTERBODY FUSION W/ MIS 1 LEVEL Right 05/23/2020   Procedure: Right Lumbar Five Sacral One Minimally invasive transforaminal lumbar interbody fusion;  Surgeon: Jadene Pierini, MD;   Location: [facility] OR;  Service: Neurosurgery;  Laterality: Right;  Right Lumbar Five Sacral One Minimally invasive transforaminal lumbar interbody fusion    Allergies: Allergies as of 03/03/2023 - Review Complete 02/03/2023  Allergen Reaction Noted   Cyclobenzaprine Hives, Swelling, and Rash 11/13/2013   Levofloxacin Hives, Swelling, and Rash 02/27/2012   Chlorpheniramine maleate  02/12/2022   Phenergan [promethazine hcl] Other (See Comments) 02/21/2017   Phenylephrine hcl Other (See Comments) 02/12/2022   Toradol [ketorolac tromethamine] Swelling and Other (See Comments) 01/17/2015   Zoloft [sertraline hcl] Swelling 05/31/2015   Cephalexin Hives and Rash 10/22/2010   Ketorolac Rash 10/22/2010   Tramadol Hives, Swelling, Other (See Comments), and Rash 01/17/2015    Medications:  Current Outpatient Medications:    albuterol (PROVENTIL) (2.5 MG/3ML) 0.083% nebulizer solution, Take 3 mLs (2.5 mg total) by nebulization every 6 (six) hours as needed for wheezing or shortness of breath., Disp: 75 mL, Rfl: 12   albuterol (VENTOLIN HFA) 108 (90 Base) MCG/ACT inhaler, Inhale 1-2 puffs into the lungs every 6 (six) hours as needed. INHALE TWO PUFFS BY MOUTH INTO LUNGS EVERY 6 HOURS AS NEEDED FOR SHORTNESS OF BREATH OR wheezing, Disp: 18 g, Rfl: 3   amLODipine (NORVASC) 5 MG tablet, Take 1 tablet (5 mg total) by mouth at bedtime., Disp: 90 tablet, Rfl: 0   aspirin EC 81 MG tablet, Take 81 mg by mouth  daily. Swallow whole., Disp: , Rfl:    atorvastatin (LIPITOR) 40 MG tablet, Take 1 tablet (40 mg total) by mouth every evening., Disp: 90 tablet, Rfl: 3   docusate sodium (COLACE) 100 MG capsule, Take 100 mg by mouth 2 (two) times daily., Disp: , Rfl:    ELDERBERRY PO, Take 1 tablet by mouth daily at 6 (six) AM., Disp: , Rfl:    gabapentin (NEURONTIN) 600 MG tablet, Take 600 mg by mouth 4 (four) times daily., Disp: , Rfl:    lactulose (CHRONULAC) 10 GM/15ML solution, Take 30 mLs by mouth in the morning, at  noon, in the evening, and at bedtime., Disp: , Rfl:    LINZESS 290 MCG CAPS capsule, Take 290 mcg by mouth daily as needed (constipation)., Disp: , Rfl:    lisinopril (ZESTRIL) 20 MG tablet, Take 2 tablets (40 mg total) by mouth daily with lunch., Disp: 180 tablet, Rfl: 1   metoprolol tartrate (LOPRESSOR) 25 MG tablet, Take 0.5 tablets (12.5 mg total) by mouth 2 (two) times daily., Disp: 90 tablet, Rfl: 1   mometasone (ELOCON) 0.1 % cream, Apply 1 Application topically daily as needed (hand rash)., Disp: , Rfl:    mometasone-formoterol (DULERA) 100-5 MCG/ACT AERO, Inhale 2 puffs into the lungs every morning., Disp: , Rfl:    Multiple Vitamins-Minerals (HAIR SKIN & NAILS PO), Take 1 tablet by mouth daily at 6 (six) AM., Disp: , Rfl:    naloxone (NARCAN) 4 MG/0.1ML LIQD nasal spray kit, Place 1 spray into the nose as needed (opioid overdose)., Disp: , Rfl:    Omega-3 Fatty Acids (OMEGA 3 PO), Take 1 capsule by mouth daily at 6 (six) AM., Disp: , Rfl:    omeprazole (PRILOSEC) 40 MG capsule, TAKE 1 CAPSULE BY MOUTH TWICE A DAY, Disp: 180 capsule, Rfl: 3   ondansetron (ZOFRAN) 8 MG tablet, TAKE 1 TABLET BY MOUTH EVERY 8 HOURS AS NEEDED FOR NAUSEA AND VOMITING, Disp: 90 tablet, Rfl: 0   oxyCODONE (ROXICODONE) 15 MG immediate release tablet, Take 15 mg by mouth every 4 (four) hours as needed for pain., Disp: , Rfl:    oxyCODONE (ROXICODONE) 5 MG immediate release tablet, Take 1-2 tablets (5-10 mg total) by mouth every 4 (four) hours as needed for breakthrough pain. (Patient not taking: Reported on 01/06/2023), Disp: 40 tablet, Rfl: 0   sucralfate (CARAFATE) 1 GM/10ML suspension, Take 10 mLs (1 g total) by mouth 4 (four) times daily., Disp: 840 mL, Rfl: 0   Tiotropium Bromide Monohydrate (SPIRIVA RESPIMAT) 2.5 MCG/ACT AERS, Take 1 puff by mouth in the morning and at bedtime., Disp: 12 g, Rfl: 11   TURMERIC PO, Take 1 tablet by mouth daily at 6 (six) AM., Disp: , Rfl:   Social History: Social History    Tobacco Use   Smoking status: Every Day    Current packs/day: 0.50    Average packs/day: 0.5 packs/day for 35.0 years (17.5 ttl pk-yrs)    Types: Cigarettes   Smokeless tobacco: Never  Vaping Use   Vaping status: Former   Quit date: 06/24/2013  Substance Use Topics   Alcohol use: Not Currently    Alcohol/week: 0.0 standard drinks of alcohol   Drug use: Not Currently    Family Medical History: Family History  Problem Relation Age of Onset   Heart disease Mother    Lung cancer Mother    Ovarian cancer Mother    Healthy Brother    Healthy Brother    Healthy Brother  Diabetes Maternal Uncle    Breast cancer Maternal Aunt     Physical Examination: There were no vitals filed for this visit.  General: Patient is in no apparent distress. Attention to examination is appropriate.  Neck:   Supple.  Full range of motion.  Respiratory: Patient is breathing without any difficulty.   NEUROLOGICAL:     Awake, alert, oriented to person, place, and time.  Speech is clear and fluent.   Cranial Nerves: Pupils equal round and reactive to light.  Facial tone is symmetric.  Facial sensation is symmetric. Shoulder shrug is symmetric. Tongue protrusion is midline.    Strength: Side Biceps Triceps Deltoid Interossei Grip Wrist Ext. Wrist Flex.  R 5 5 5 5 5 5 5   L 5 5 5 5 5 5 5    Side Iliopsoas Quads Hamstring PF DF EHL  R 5 5 5 5 5 5   L 5 5 5 5 5 5    Reflexes are ***2+ and symmetric at the biceps, triceps, brachioradialis, patella and achilles.   Hoffman's is absent. Clonus is absent  Bilateral upper and lower extremity sensation is intact to light touch ***.     No evidence of dysmetria noted.  Gait is normal.    Imaging: *** I have personally reviewed the images and agree with the above interpretation.  Medical Decision Making/Assessment and Plan: Ms. Corban is a pleasant 57 y.o. female with ***  There are no diagnoses linked to this encounter.   Thank you for  involving me in the care of this patient.    Lovenia Kim MD/MSCR Neurosurgery

## 2023-03-03 ENCOUNTER — Ambulatory Visit: Payer: 59 | Admitting: Neurosurgery

## 2023-03-03 ENCOUNTER — Other Ambulatory Visit: Payer: Self-pay | Admitting: Family Medicine

## 2023-03-03 DIAGNOSIS — I10 Essential (primary) hypertension: Secondary | ICD-10-CM

## 2023-03-04 ENCOUNTER — Ambulatory Visit (INDEPENDENT_AMBULATORY_CARE_PROVIDER_SITE_OTHER): Payer: 59 | Admitting: Family Medicine

## 2023-03-04 ENCOUNTER — Encounter: Payer: Self-pay | Admitting: Family Medicine

## 2023-03-04 VITALS — BP 136/86 | HR 62 | Temp 97.5°F | Ht 64.0 in | Wt 181.7 lb

## 2023-03-04 DIAGNOSIS — R110 Nausea: Secondary | ICD-10-CM

## 2023-03-04 DIAGNOSIS — F5101 Primary insomnia: Secondary | ICD-10-CM | POA: Diagnosis not present

## 2023-03-04 MED ORDER — ONDANSETRON HCL 8 MG PO TABS: 8.0000 mg | ORAL_TABLET | Freq: Three times a day (TID) | ORAL | 1 refills | Status: DC | PRN

## 2023-03-04 MED ORDER — ZOLPIDEM TARTRATE 5 MG PO TABS
5.0000 mg | ORAL_TABLET | Freq: Every evening | ORAL | 0 refills | Status: DC | PRN
Start: 2023-03-04 — End: 2023-06-03

## 2023-03-04 NOTE — Progress Notes (Unsigned)
Established patient visit   Patient: Sara Munoz   DOB: 06-16-1966   57 y.o. Female  MRN: [id number] Visit Date: 03/04/2023  Today's healthcare provider: Ronnald Ramp, MD   No chief complaint on file.  Subjective      HTN  Changed metoprolol to 12.5mg  twice daily, amlodipine 5mg  every day, lisinopril 20mg  every day   Medications: Outpatient Medications Prior to Visit  Medication Sig   albuterol (PROVENTIL) (2.5 MG/3ML) 0.083% nebulizer solution Take 3 mLs (2.5 mg total) by nebulization every 6 (six) hours as needed for wheezing or shortness of breath.   albuterol (VENTOLIN HFA) 108 (90 Base) MCG/ACT inhaler Inhale 1-2 puffs into the lungs every 6 (six) hours as needed. INHALE TWO PUFFS BY MOUTH INTO LUNGS EVERY 6 HOURS AS NEEDED FOR SHORTNESS OF BREATH OR wheezing   amLODipine (NORVASC) 5 MG tablet TAKE 1 TABLET BY MOUTH EVERYDAY AT BEDTIME   aspirin EC 81 MG tablet Take 81 mg by mouth daily. Swallow whole.   atorvastatin (LIPITOR) 40 MG tablet Take 1 tablet (40 mg total) by mouth every evening.   docusate sodium (COLACE) 100 MG capsule Take 100 mg by mouth 2 (two) times daily.   ELDERBERRY PO Take 1 tablet by mouth daily at 6 (six) AM.   gabapentin (NEURONTIN) 600 MG tablet Take 600 mg by mouth 4 (four) times daily.   lactulose (CHRONULAC) 10 GM/15ML solution Take 30 mLs by mouth in the morning, at noon, in the evening, and at bedtime.   LINZESS 290 MCG CAPS capsule Take 290 mcg by mouth daily as needed (constipation).   lisinopril (ZESTRIL) 20 MG tablet TAKE 2 TABLETS (40 MG TOTAL) BY MOUTH DAILY WITH LUNCH.   metoprolol tartrate (LOPRESSOR) 25 MG tablet TAKE 1 TABLET BY MOUTH TWICE A DAY   mometasone (ELOCON) 0.1 % cream Apply 1 Application topically daily as needed (hand rash).   mometasone-formoterol (DULERA) 100-5 MCG/ACT AERO Inhale 2 puffs into the lungs every morning.   Multiple Vitamins-Minerals (HAIR SKIN & NAILS PO) Take 1 tablet by mouth daily at 6  (six) AM.   naloxone (NARCAN) 4 MG/0.1ML LIQD nasal spray kit Place 1 spray into the nose as needed (opioid overdose).   Omega-3 Fatty Acids (OMEGA 3 PO) Take 1 capsule by mouth daily at 6 (six) AM.   omeprazole (PRILOSEC) 40 MG capsule TAKE 1 CAPSULE BY MOUTH TWICE A DAY   oxyCODONE (ROXICODONE) 15 MG immediate release tablet Take 15 mg by mouth every 4 (four) hours as needed for pain.   sucralfate (CARAFATE) 1 GM/10ML suspension Take 10 mLs (1 g total) by mouth 4 (four) times daily.   Tiotropium Bromide Monohydrate (SPIRIVA RESPIMAT) 2.5 MCG/ACT AERS Take 1 puff by mouth in the morning and at bedtime.   TURMERIC PO Take 1 tablet by mouth daily at 6 (six) AM.   [DISCONTINUED] ondansetron (ZOFRAN) 8 MG tablet TAKE 1 TABLET BY MOUTH EVERY 8 HOURS AS NEEDED FOR NAUSEA AND VOMITING   oxyCODONE (ROXICODONE) 5 MG immediate release tablet Take 1-2 tablets (5-10 mg total) by mouth every 4 (four) hours as needed for breakthrough pain. (Patient not taking: Reported on 01/06/2023)   No facility-administered medications prior to visit.    Review of Systems  Last CBC Lab Results  Component Value Date   WBC 7.5 01/06/2023   HGB 9.7 (L) 01/06/2023   HCT 31.8 (L) 01/06/2023   MCV 76 (L) 01/06/2023   MCH 23.3 (L) 01/06/2023  RDW 17.2 (H) 01/06/2023   PLT 200 01/06/2023   Last metabolic panel Lab Results  Component Value Date   GLUCOSE 87 01/06/2023   NA 138 01/06/2023   K 5.0 01/06/2023   CL 99 01/06/2023   CO2 24 01/06/2023   BUN 11 01/06/2023   CREATININE 0.95 01/06/2023   EGFR 71 01/06/2023   CALCIUM 9.5 01/06/2023   PHOS 2.8 05/16/2021   PROT 6.5 01/06/2023   ALBUMIN 4.0 01/06/2023   LABGLOB 2.5 01/06/2023   AGRATIO 1.6 04/03/2022   BILITOT <0.2 01/06/2023   ALKPHOS 85 01/06/2023   AST 15 01/06/2023   ALT 10 01/06/2023   ANIONGAP 10 10/30/2022      {See past labs  Heme  Chem  Endocrine  Serology  Results Review (optional):1}   Objective    BP 136/86 (BP Location: Left  Arm, Patient Position: Sitting, Cuff Size: Normal)   Pulse 62   Temp (!) 97.5 F (36.4 C) (Oral)   Ht 5\' 4"  (1.626 m)   Wt 181 lb 11.2 oz (82.4 kg)   LMP 12/16/2017 (Approximate)   SpO2 100%   BMI 31.19 kg/m  BP Readings from Last 3 Encounters:  03/04/23 136/86  01/06/23 115/66  12/03/22 (!) 154/97      {See vitals history (optional):1}  Physical Exam Vitals reviewed.  Constitutional:      General: She is not in acute distress.    Appearance: Normal appearance. She is not ill-appearing, toxic-appearing or diaphoretic.  Eyes:     Conjunctiva/sclera: Conjunctivae normal.  Cardiovascular:     Rate and Rhythm: Normal rate and regular rhythm.     Pulses: Normal pulses.     Heart sounds: Normal heart sounds. No murmur heard.    No friction rub. No gallop.  Pulmonary:     Effort: Pulmonary effort is normal. No respiratory distress.     Breath sounds: Normal breath sounds. No stridor. No wheezing, rhonchi or rales.  Abdominal:     General: Bowel sounds are normal. There is no distension.     Palpations: Abdomen is soft.     Tenderness: There is no abdominal tenderness.  Musculoskeletal:     Right lower leg: No edema.     Left lower leg: No edema.  Skin:    Findings: No erythema or rash.  Neurological:     Mental Status: She is alert and oriented to person, place, and time.       No results found for any visits on 03/04/23.  Assessment & Plan     Problem List Items Addressed This Visit     Chronic nausea   Relevant Medications   ondansetron (ZOFRAN) 8 MG tablet   Other Visit Diagnoses     Primary insomnia    -  Primary   Relevant Medications   zolpidem (AMBIEN) 5 MG tablet        Return for CHRONIC F/U, Insomnia.       Ronnald Ramp, MD  [facility] [phone] (phone) [phone] (fax)  [facility]

## 2023-03-05 ENCOUNTER — Other Ambulatory Visit: Payer: Self-pay | Admitting: Surgery

## 2023-03-07 ENCOUNTER — Other Ambulatory Visit: Payer: Self-pay

## 2023-03-07 ENCOUNTER — Encounter
Admission: RE | Admit: 2023-03-07 | Discharge: 2023-03-07 | Disposition: A | Discharge status: Home / Self Care | Payer: 59 | Source: Ambulatory Visit | Attending: Surgery | Admitting: Surgery

## 2023-03-07 ENCOUNTER — Telehealth: Payer: Self-pay | Admitting: *Deleted

## 2023-03-07 VITALS — Ht 64.0 in | Wt 180.0 lb

## 2023-03-07 DIAGNOSIS — Z01812 Encounter for preprocedural laboratory examination: Secondary | ICD-10-CM

## 2023-03-07 DIAGNOSIS — I10 Essential (primary) hypertension: Secondary | ICD-10-CM

## 2023-03-07 HISTORY — DX: Hyperlipidemia, unspecified: E78.5

## 2023-03-07 HISTORY — DX: Peripheral vascular disease, unspecified: I73.9

## 2023-03-07 NOTE — Telephone Encounter (Signed)
Name: Sara Munoz  DOB: 1966/05/10  MRN: [id number]   Primary Cardiologist: Debbe Odea, MD  Chart reviewed as part of pre-operative protocol coverage. Patient was contacted 03/07/2023 in reference to pre-operative risk assessment for pending surgery as outlined below.  Sara Munoz was last seen on 11/01/22 by Dr. Azucena Cecil and was asymptomatic after reporting chest pain at a previous visit. Prior to this May appt, patient underwent coronary CTA that revealed "mild non-obstructive CAD involving left main, proximal LAD, left circumflex. She also had TTE that showed normal LVEF 60-65%. Based on these test results and no symptoms, patient was cleared for left shoulder surgery.  Since her last visit, Sara Munoz has been feeling well from a cardiac standpoint. She denies chest pain, shortness of breath, lower extremity edema, fatigue, palpitations, melena, hematuria, hemoptysis, diaphoresis, weakness, presyncope, syncope, orthopnea, and PND.   According to the Revised Cardiac Risk Index (RCRI), her perioperative risk of major cardiac events is 0.4%.  Therefore, based on ACC/AHA guidelines, the patient would be at acceptable risk for the planned right shoulder arthroscopy with debridment, decompression, rotator cuff repair, and probable biceps tenodesis without further cardiovascular testing.   The patient was advised that if she develops new symptoms prior to surgery to contact our office to arrange for a follow-up visit, and she verbalized understanding.  Per office protocol, if patient is without any new symptoms or concerns at the time of their virtual visit, he/she may hold Aspirin for 5-7 days prior to procedure. Please resume Aspirin as soon as possible postprocedure, at the discretion of the surgeon.   I will route this recommendation to the requesting party via Epic fax function and remove from pre-op pool. Please call with questions.  Perlie Gold, [name] 03/07/2023, 4:19  PM

## 2023-03-07 NOTE — Progress Notes (Signed)
Perioperative / Anesthesia Services  Pre-Admission Testing Clinical Review / Preoperative Anesthesia Consult  Date: 03/08/23  Patient Demographics:  Name: Sara Munoz DOB:   1966/03/30 MRN:   [id number]  Planned Surgical Procedure(s):    Case: [id number] Date/Time: 03/11/23 1229   Procedure: RIGHT SHOULDER ARTHROSCOPY WITH DEBRIDMENT, DECOMPRESSION, POSSIBLE ROTATOR CUFF REPAIR AND PROBABLE BICEPS TENODESIS. (Right: Shoulder)   Anesthesia type: Choice   Pre-op diagnosis:      S/P arthroscopy of left shoulder Z98.890     Left shoulder pain, unspecified chronicity M25.512     Shoulder stiffness, left M25.612     Left arm weakness R29.89   Location: [facility] OR ROOM 02 / [facility]   Surgeons: Christena Flake, MD     NOTE: Available PAT nursing documentation and vital signs have been reviewed. Clinical nursing staff has updated patient's PMH/PSHx, current medication list, and drug allergies/intolerances to ensure comprehensive history available to assist in medical decision making as it pertains to the aforementioned surgical procedure and anticipated anesthetic course. Extensive review of available clinical information personally performed. [place] PMH and PSHx updated with any diagnoses/procedures that  may have been inadvertently omitted during her intake with the pre-admission testing department's nursing staff.  Clinical Discussion:  Sara Munoz is a 57 y.o. female who is submitted for pre-surgical anesthesia review and clearance prior to her undergoing the above procedure. Patient is a Current Smoker (17.5 pack years). Pertinent PMH includes: CAD, ? MI, PVD, carotid artery disease, aortic atherosclerosis, CVA/TIA, atypical angina, HTN, HLD, asthma, chronic nausea, GERD (on daily PPI + Carafate), anemia, cervical DDD (s/p ACDF C5-C7, PSIF C5-T1), lumbar DDD (s/p PLIF L5-S1), tendinitis LEFT rotator cuff, incomplete tear of LEFT rotator cuff, chronic lower back pain  (on chronic opioid therapy), anxiety, depression, PTSD.  Patient is followed by cardiology Azucena Cecil, MD). She was last seen in the cardiology clinic on 11/01/2022; notes reviewed. At the time of her clinic visit, patient doing well overall from a cardiovascular perspective. Patient denied any chest pain, shortness of breath, PND, orthopnea, palpitations, significant peripheral edema, weakness, fatigue, vertiginous symptoms, or presyncope/syncope. Patient with a past medical history significant for cardiovascular diagnoses. Documented physical exam was grossly benign, providing no evidence of acute exacerbation and/or decompensation of the patient's known cardiovascular conditions.  Of note, complete records regarding patient's cardiovascular history unavailable for review at time of consult.  Information gathered from patient report and from notes provided by her local cardiologist.  Patient reported to have suffered an MI in the past.  Details surrounding cardiovascular event unavailable for review.  Patient has undergone multiple diagnostic LEFT heart catheterizations in the past. Patient unable to advise as to whether or not she has undergone PCI in the past.  Notes from various cardiology providers indicate the patient has at least 1 cardiac stent.  Diagnostic LEFT heart catheterization performed in 2011 revealed no significant obstructive CAD  Diagnostic LEFT heart catheterization performed on 07/07/2014 revealed multivessel CAD; 10-20% mid LM, 25% LAD, 30-40% LCx and 20-30% RCA.  Intervention was deferred opting for medical management.  Diagnostic LEFT heart catheterization was performed on 02/22/2016 revealing multivessel CAD; 25% LM and 25% proximal LCx.  Again, given the nonobstructive nature of the noted coronary artery disease, intervention was deferred opting for medical management.  Most recent myocardial perfusion imaging study was performed on 09/01/2019 revealing a normal left  ventricular systolic function with an EF of 55-65%.  There was no evidence of stress-induced myocardial ischemia  or arrhythmia; no scintigraphic evidence of scar.  Study determined to be normal and low risk.  Most recent TTE was performed on 10/21/2022 revealing a normal left ventricular systolic function with an EF of 60 to 65%.  There were no regional wall motion abnormalities.  There was mild LVH.  Right ventricular size and function normal.  There was mild mitral valve regurgitation.  All transvalvular gradients were noted to be normal providing no evidence suggestive of valvular stenosis.  Aorta normal in size with no evidence of aneurysmal dilatation.  Coronary CTA was performed on 10/28/2022 revealing an elevated coronary calcium score of 1092.  This was in the 99th percentile for age, sex, and race matched control.  Patient with mild (25-49%) stenosis in the mid LM, proximal LAD, and proximal LCx territories.  Additionally there was minimal (less than 25%) stenosis in the RCA territory.  Preventative therapy and risk factor modification was recommended.  Blood pressure reasonably controlled at 136/72 mmHg on currently prescribed CCB (amlodipine), ACEi (lisinopril) and beta-blocker entheses metoprolol tartrate) therapies.  Patient is on atorvastatin for her HLD diagnosis and ASCVD + omega-3 fatty acid prevention. Patient is not diabetic/T2DM + omega-3 fatty acid.  Patient is not diabetic.  Patient does not have an OSAH diagnosis. Functional capacity, as defined by DASI, is documented as being >/= 4 METS. No changes were made to her medication regimen during her visit with cardiology.  Patient scheduled to follow-up with outpatient cardiology in 6 months or sooner if needed.  Sara Munoz is scheduled for an elective RIGHT SHOULDER ARTHROSCOPY WITH DEBRIDMENT, DECOMPRESSION, POSSIBLE ROTATOR CUFF REPAIR AND PROBABLE BICEPS TENODESIS. (Right: Shoulder) on 03/11/2023 with Dr. Leron Croak, MD. Given  patient's past medical history significant for cardiovascular diagnoses, presurgical cardiac clearance was sought by the PAT team. Per cardiology, "according to the Revised Cardiac Risk Index (RCRI), her perioperative risk of major cardiac events is 0.4%. Therefore, based on ACC/AHA guidelines, the patient would be at ACCEPTABLE risk for the planned right shoulder arthroscopy with debridement, decompression, rotator cuff repair, and probable biceps tenodesis without further cardiovascular testing".  In review of her medication reconciliation, it is noted that patient is currently on prescribed daily antithrombotic therapy. She has been instructed on recommendations for holding her daily low-dose ASA for 5 days prior to her procedure with plans to restart as soon as postoperative bleeding risk felt to be minimized by her attending surgeon. The patient has been instructed that her last dose of her ASA should be on  03/05/2023.   Patient denies previous perioperative complications with anesthesia in the past. In review of the available records, it is noted that patient underwent a general anesthetic course here at [facility] ([facility] in 11/2022 without documented complications.      03/07/2023    1:50 PM 03/04/2023    2:00 PM 01/06/2023    1:43 PM  Vitals with BMI  Height 5\' 4"  5\' 4"  5\' 4"   Weight 180 lbs 181 lbs 11 oz 188 lbs 3 oz  BMI 30.88 31.17 32.29  Systolic  136 115  Diastolic  86 66  Pulse  62 56    Providers/Specialists:   NOTE: Primary physician provider listed below. Patient may have been seen by APP or partner within same practice.   PROVIDER ROLE / SPECIALTY LAST OV  Poggi, Excell Seltzer, MD Orthopedics (Surgeon) 02/28/2023  Ronnald Ramp, MD Primary Care Provider 03/04/2023  Debbe Odea, MD Cardiology 11/01/2022; update call with  APP 03/07/2023   Allergies:  Cyclobenzaprine, Levofloxacin, Chlorpheniramine maleate, Phenergan  [promethazine hcl], Phenylephrine hcl, Toradol [ketorolac tromethamine], Zoloft [sertraline hcl], Cephalexin, Ketorolac, and Tramadol  Current Home Medications:   No current facility-administered medications for this encounter.    albuterol (PROVENTIL) (2.5 MG/3ML) 0.083% nebulizer solution   albuterol (VENTOLIN HFA) 108 (90 Base) MCG/ACT inhaler   amLODipine (NORVASC) 5 MG tablet   aspirin EC 81 MG tablet   atorvastatin (LIPITOR) 40 MG tablet   docusate sodium (COLACE) 100 MG capsule   ELDERBERRY PO   gabapentin (NEURONTIN) 600 MG tablet   lactulose (CHRONULAC) 10 GM/15ML solution   LINZESS 290 MCG CAPS capsule   lisinopril (ZESTRIL) 40 MG tablet   metoprolol tartrate (LOPRESSOR) 25 MG tablet   mometasone (ELOCON) 0.1 % cream   mometasone-formoterol (DULERA) 100-5 MCG/ACT AERO   Multiple Vitamins-Minerals (HAIR SKIN & NAILS PO)   naloxone (NARCAN) 4 MG/0.1ML LIQD nasal spray kit   Omega-3 Fatty Acids (OMEGA 3 PO)   omeprazole (PRILOSEC) 40 MG capsule   ondansetron (ZOFRAN) 8 MG tablet   oxyCODONE (ROXICODONE) 15 MG immediate release tablet   sucralfate (CARAFATE) 1 GM/10ML suspension   Tiotropium Bromide Monohydrate (SPIRIVA RESPIMAT) 2.5 MCG/ACT AERS   TURMERIC PO   zolpidem (AMBIEN) 5 MG tablet   History:   Past Medical History:  Diagnosis Date   Anemia    Anxiety    Aortic atherosclerosis (HCC)    Asthma    Atypical chest pain 08/02/2015   Carotid artery disease (HCC)    Chronic back pain    Chronic nausea    Chronic, continuous use of opioids 02/27/2016   a.) has naloxone Rx available   COPD (chronic obstructive pulmonary disease) (HCC)    Coronary artery disease    a.) LHC 2011: no sig CAD (report unavailable); b.) LHC 07/07/2014: 10-20% mLM, 25% LAD, 30-40% LCx, 20-30% RCA -- med mgmt; c.) LHC 02/22/2016: 25% LM, 25% pLCx --> med mgmt; d.) MPI 07/10/2017: no ischemia; e.) MPI 09/01/2019: no ischemia; f.) cCTA 10/28/2022: Ca score 1092 (99th percentile for  age/sex/race match control)   Current smoker    DDD (degenerative disc disease), cervical    a.) s/p ACDF C5-C7 and PSIF C5-T1   DDD (degenerative disc disease), lumbar 05/23/2020   a.) s/p L5-S1 PLIF   GERD (gastroesophageal reflux disease)    Hepatic steatosis    Hepatitis C virus infection cured after antiviral drug therapy    a.) s/p Tx with standard glecaprevir-pibrentasvir course   Hypercholesteremia    Hyperkalemia    Hyperlipidemia    Hypertension    Hypokalemia    Hypomagnesemia 01/04/2014   Incomplete tear of left rotator cuff    MI (myocardial infarction) (HCC)    Migraines    MSSA (methicillin susceptible Staphylococcus aureus) septicemia (HCC) 03/03/2014   a.) TEE (-) for valvular vegitation   Nerve pain    per patient, in the lower back   Osteoarthritis    Pneumonia    PTSD (post-traumatic stress disorder)    PVD (peripheral vascular disease) (HCC)    Tendinitis of left rotator cuff    TIA (transient ischemic attack)    Past Surgical History:  Procedure Laterality Date   ANTERIOR CERVICAL DECOMP/DISCECTOMY FUSION N/A 03/30/2018   Procedure: Cervical six-seven Anterior cervical decompression/discectomy/fusion;  Surgeon: Jadene Pierini, MD;  Location: [facility] OR;  Service: Neurosurgery;  Laterality: N/A;   ANTERIOR CERVICAL DECOMP/DISCECTOMY FUSION N/A 01/11/2019   Procedure: Cervical Five-Six Anterior cervical discectomy fusion,  Cervical Five to Cervical Seven anterior instrumented fusion;  Surgeon: Jadene Pierini, MD;  Location: [facility] OR;  Service: Neurosurgery;  Laterality: N/A;  Cervical Five-Six Anterior cervical discectomy fusion,  Cervical Five to Cervical Seven anterior instrumented fusion   CARDIAC CATHETERIZATION Left 02/22/2016   Procedure: Left Heart Cath and Coronary Angiography;  Surgeon: Alwyn Pea, MD;  Location: [facility] INVASIVE CV LAB;  Service: Cardiovascular;  Laterality: Left;   CATARACT EXTRACTION W/ INTRAOCULAR LENS IMPLANT Right     COLONOSCOPY WITH PROPOFOL N/A 09/19/2016   Procedure: COLONOSCOPY WITH PROPOFOL;  Surgeon: [name], MD;  Location: [facility] ENDOSCOPY;  Service: Endoscopy;  Laterality: N/A;   DIAGNOSTIC LAPAROSCOPY     ESOPHAGOGASTRODUODENOSCOPY (EGD) WITH PROPOFOL N/A 09/18/2017   Procedure: ESOPHAGOGASTRODUODENOSCOPY (EGD) WITH PROPOFOL;  Surgeon: [name], MD;  Location: [facility] ENDOSCOPY;  Service: Gastroenterology;  Laterality: N/A;   ESOPHAGOGASTRODUODENOSCOPY (EGD) WITH PROPOFOL N/A 08/10/2019   Procedure: ESOPHAGOGASTRODUODENOSCOPY (EGD) WITH PROPOFOL;  Surgeon: [name], MD;  Location: [facility] ENDOSCOPY;  Service: Endoscopy;  Laterality: N/A;   HEMORRHOID SURGERY     LEFT HEART CATH AND CORONARY ANGIOGRAPHY Left 07/07/2014   LEFT HEART CATH AND CORONARY ANGIOGRAPHY Left 2011   LUMBAR WOUND DEBRIDEMENT N/A 11/05/2022   Procedure: Thoracic one spinous process removal and wound revision;  Surgeon: Jadene Pierini, MD;  Location: [facility] OR;  Service: Neurosurgery;  Laterality: N/A;   NASAL SINUS SURGERY     ORIF FEMUR FRACTURE Left    ORIF TIBIA & FIBULA FRACTURES Right    OVARIAN CYST SURGERY     POSTERIOR CERVICAL FUSION/FORAMINOTOMY N/A 05/14/2021   Procedure: Cervical five, Cervical six Laminectomy,foraminotomy with Cervical five-six Cervical six-seven Cervical seven-Thoracic one Posterior instrumented fusion;  Surgeon: Jadene Pierini, MD;  Location: [facility] OR;  Service: Neurosurgery;  Laterality: N/A;   SHOULDER ARTHROSCOPY WITH SUBACROMIAL DECOMPRESSION, ROTATOR CUFF REPAIR AND BICEP TENDON REPAIR Left 05/28/2022   Procedure: SHOULDER ARTHROSCOPY WITH DEBRIDEMENT, DECOMPRESSION, ROTATOR CUFF REPAIR AND BICEP TENDON REPAIR;  Surgeon: Christena Flake, MD;  Location: [facility] ORS;  Service: Orthopedics;  Laterality: Left;   SHOULDER ARTHROSCOPY WITH SUBACROMIAL DECOMPRESSION, ROTATOR CUFF REPAIR AND BICEP TENDON REPAIR Left 12/03/2022   Procedure: SHOULDER Extensive ARTHROSCOPY WITH DEBRIDEMENT, removal of  Retained Foreign Body;  Surgeon: Christena Flake, MD;  Location: [facility] ORS;  Service: Orthopedics;  Laterality: Left;   TRANSFORAMINAL LUMBAR INTERBODY FUSION W/ MIS 1 LEVEL Right 05/23/2020   Procedure: Right Lumbar Five Sacral One Minimally invasive transforaminal lumbar interbody fusion;  Surgeon: Jadene Pierini, MD;  Location: [facility] OR;  Service: Neurosurgery;  Laterality: Right;  Right Lumbar Five Sacral One Minimally invasive transforaminal lumbar interbody fusion   Family History  Problem Relation Age of Onset   Heart disease Mother    Lung cancer Mother    Ovarian cancer Mother    Healthy Brother    Healthy Brother    Healthy Brother    Diabetes Maternal Uncle    Breast cancer Maternal Aunt    Social History   Tobacco Use   Smoking status: Every Day    Current packs/day: 0.50    Average packs/day: 0.5 packs/day for 35.0 years (17.5 ttl pk-yrs)    Types: Cigarettes   Smokeless tobacco: Never  Vaping Use   Vaping status: Former   Quit date: 06/24/2013  Substance Use Topics   Alcohol use: Not Currently    Alcohol/week: 0.0 standard drinks of alcohol   Drug use: Not Currently    Pertinent Clinical Results:  LABS:   No visits with results within 3 Day(s) from this visit.  Latest known visit with results is:  Office Visit on 01/06/2023  Component Date Value Ref Range Status   Hgb A1c MFr Bld 01/06/2023 6.0 (H)  4.8 - 5.6 % Final   Comment:          Prediabetes: 5.7 - 6.4          Diabetes: >6.4          Glycemic control for adults with diabetes: <7.0    Est. average glucose Bld gHb Est-m* 01/06/2023 126  mg/dL Final   Glucose 16/03/9603 87  70 - 99 mg/dL Final   BUN 54/02/8118 11  6 - 24 mg/dL Final   Creatinine, Ser 01/06/2023 0.95  0.57 - 1.00 mg/dL Final   eGFR 14/78/2956 71  >59 mL/min/1.73 Final   BUN/Creatinine Ratio 01/06/2023 12  9 - 23 Final   Sodium 01/06/2023 138  134 - 144 mmol/L Final   Potassium 01/06/2023 5.0  3.5 - 5.2 mmol/L Final   Chloride  01/06/2023 99  96 - 106 mmol/L Final   CO2 01/06/2023 24  20 - 29 mmol/L Final   Calcium 01/06/2023 9.5  8.7 - 10.2 mg/dL Final   Total Protein 21/30/8657 6.5  6.0 - 8.5 g/dL Final   Albumin 84/69/6295 4.0  3.8 - 4.9 g/dL Final   Globulin, Total 01/06/2023 2.5  1.5 - 4.5 g/dL Final   Bilirubin Total 01/06/2023 <0.2  0.0 - 1.2 mg/dL Final   Alkaline Phosphatase 01/06/2023 85  44 - 121 IU/L Final   AST 01/06/2023 15  0 - 40 IU/L Final   ALT 01/06/2023 10  0 - 32 IU/L Final   WBC 01/06/2023 7.5  3.4 - 10.8 x10E3/uL Final   RBC 01/06/2023 4.17  3.77 - 5.28 x10E6/uL Final   Hemoglobin 01/06/2023 9.7 (L)  11.1 - 15.9 g/dL Final   Hematocrit 28/41/3244 31.8 (L)  34.0 - 46.6 % Final   MCV 01/06/2023 76 (L)  79 - 97 fL Final   MCH 01/06/2023 23.3 (L)  26.6 - 33.0 pg Final   MCHC 01/06/2023 30.5 (L)  31.5 - 35.7 g/dL Final   RDW 06/26/7251 17.2 (H)  11.7 - 15.4 % Final   Platelets 01/06/2023 200  150 - 450 x10E3/uL Final   Vitamin B-12 01/06/2023 512  232 - 1,245 pg/mL Final   TSH 01/06/2023 1.060  0.450 - 4.500 uIU/mL Final   T3, Free 01/06/2023 3.1  2.0 - 4.4 pg/mL Final   Free T4 01/06/2023 0.99  0.82 - 1.77 ng/dL Final    ECG: Date: 66/44/0347 Time ECG obtained: 1101 AM Rate: 67 bpm Rhythm: normal sinus Axis (leads I and aVF): Normal Intervals: PR 192 ms. QRS 76 ms. QTc 404 ms. ST segment and T wave changes: No evidence of acute ST segment elevation or depression Comparison: Similar to previous tracing obtained on 12/12/2021   IMAGING / PROCEDURES: MR SHOULDER RIGHT WO CONTRAST performed on 02/21/2023 Moderate tendinosis of the supraspinatus tendon with a small insertional interstitial tear Moderate tendinosis of the infraspinatus tendon. Moderate tendinosis of the intra-articular portion of the long head of the biceps tendon.   CT CORONARY MORPH W/CTA COR W/SCORE W/CA W/CM &/OR WO/CM performed on 10/28/2022 Coronary calcium score of 1092. This was 99th percentile for age and sex  matched control. Normal coronary origin with left dominance. Mild LM, proximal LAD and LCx stenosis (25-49%). Minimal RCA stenosis (<25%). CAD-RADS 2. Mild  non-obstructive CAD (25-49%). Consider non-atherosclerotic causes of chest pain. Consider preventive therapy and risk factor modification. Aortic atherosclerosis  VAS [place] ABI WITH/WO TBI performed on 10/21/2022 Resting right ankle-brachial index is within normal range. The right toe-brachial index is normal.  Resting left ankle-brachial index is within normal range. The left toe-brachial index is normal.   TRANSTHORACIC ECHOCARDIOGRAM performed of 10/21/2022 Left ventricular ejection fraction, by estimation, is 60 to 65%. The left ventricle has normal function. The left ventricle has no regional  wall motion abnormalities. There is mild left ventricular hypertrophy. Left ventricular diastolic parameters were normal.  Right ventricular systolic function is normal. The right ventricular size is normal.  The mitral valve is normal in structure. Mild mitral valve regurgitation.  The aortic valve was not well visualized. Aortic valve regurgitation is not visualized. No aortic stenosis is present.  The inferior vena cava is normal in size with greater than 50% respiratory variability, suggesting right atrial pressure of 3 mmHg.   MR LUMBAR SPINE WO CONTRAST performed on 09/16/2022 At L5-S1 there is posterior lumbar interbody fusion. Mild-moderate right foraminal stenosis. No left foraminal stenosis. No spinal stenosis. At L4-5 there is a broad-based disc bulge. Mild bilateral facet arthropathy. Mild spinal stenosis. Bilateral lateral recess stenosis. Mild bilateral foraminal stenosis. No acute osseous injury of the lumbar spine.  MR SHOULDER LEFT WO CONTRAST performed on 05/11/2022 Minimal bursal sided degenerative fraying of the anterior supraspinatus tendon footprint. Mild-to-moderate infraspinatus tendinosis. Mild proximal long head of the  biceps tendinosis with probable chronic midsubstance partial-thickness longitudinal tear. No full-thickness tendon tear or tendon retraction. Mild degenerative changes of the acromioclavicular joint. Mild subacromial/subdeltoid bursitis.   MYOCARDIAL PERFUSION IMAGING STUDY (LEXISCAN) performed on 09/01/2019 Normal left ventricular systolic function with a normal LVEF of 55-65% Normal myocardial thickening and wall motion Left ventricular cavity size normal SPECT images demonstrate homogenous tracer distribution throughout the myocardium No evidence of stress-induced myocardial ischemia or arrhythmia Normal low risk study  Impression and Plan:  Sara Munoz has been referred for pre-anesthesia review and clearance prior to her undergoing the planned anesthetic and procedural courses. Available labs, pertinent testing, and imaging results were personally reviewed by me in preparation for upcoming operative/procedural course. [facility] medical record has been updated following extensive record review and patient interview with PAT staff.   This patient has been appropriately cleared by cardiology with an overall ACCEPTABLE risk of experiencing significant perioperative cardiovascular complications. Based on clinical review performed today (03/08/23), barring any significant acute changes in the patient's overall condition, it is anticipated that she will be able to proceed with the planned surgical intervention. Any acute changes in clinical condition may necessitate her procedure being postponed and/or cancelled. Patient will meet with anesthesia team (MD and/or CRNA) on the day of her procedure for preoperative evaluation/assessment. Questions regarding anesthetic course will be fielded at that time.   Pre-surgical instructions were reviewed with the patient during her PAT appointment, and questions were fielded to satisfaction by PAT clinical staff. She has been instructed on which medications  that she will need to hold prior to surgery, as well as the ones that have been deemed safe/appropriate to take on the day of her procedure. As part of the general education provided by PAT, patient made aware both verbally and in writing, that she would need to abstain from the use of any illegal substances during her perioperative course.  She was advised that failure to follow the provided instructions could necessitate case cancellation or  result in serious perioperative complications up to and including death. Patient encouraged to contact PAT and/or her surgeon's office to discuss any questions or concerns that may arise prior to surgery; verbalized understanding.   Quentin Mulling, MSN, APRN, FNP-C, CEN [facility]  Peri-operative Services Nurse Practitioner Phone: [phone] Fax: [phone] 03/08/23 12:17 PM  NOTE: This note has been prepared using Dragon dictation software. Despite my best ability to proofread, there is always the potential that unintentional transcriptional errors may still occur from this process.

## 2023-03-07 NOTE — Telephone Encounter (Signed)
-----   Message from Verlee Monte sent at 03/07/2023  3:22 PM EDT ----- Regarding: Request for pre-operative cardiac clearance Request for pre-operative cardiac clearance:  1. What type of surgery is being performed?  RIGHT SHOULDER ARTHROSCOPY WITH DEBRIDMENT, DECOMPRESSION, POSSIBLE ROTATOR CUFF REPAIR AND PROBABLE BICEPS TENODESIS.  2. When is this surgery scheduled?  03/11/2023 Cleared for similar procedure x 90 days ago. Updated clearance request for new surgery.   3. Type of clearance being requested (medical, pharmacy, both)? MEDICAL   4. Are there any medications that need to be held prior to surgery? ASA  5. Practice name and name of physician performing surgery?  Performing surgeon: Dr. Leron Croak, MD Requesting clearance: Quentin Mulling, FNP-C    6. Anesthesia type (none, local, MAC, general)? GENERAL  7. What is the office phone and fax number?   Phone: [phone] Fax: [phone]  ATTENTION: Unable to create telephone message as per your standard workflow. Directed by [facility] providers to send requests for cardiac clearance to this pool for appropriate distribution to provider covering pre-operative clearances.   Quentin Mulling, MSN, APRN, FNP-C, CEN [facility]  Peri-operative Services Nurse Practitioner Phone: [phone] 03/07/23 3:22 PM

## 2023-03-07 NOTE — Patient Instructions (Addendum)
Your procedure is scheduled on: Tuesday, September 17 Report to the Registration Desk on the 1st floor of the [facility] To find out your arrival time, please call [phone] between 1PM - 3PM on: Monday, September 16 If your arrival time is 6:00 am, do not arrive before that time as the [facility] entrance doors do not open until 6:00 am.  REMEMBER: Instructions that are not followed completely may result in serious medical risk, up to and including death; or upon the discretion of your surgeon and anesthesiologist your surgery may need to be rescheduled.  Do not eat food after midnight the night before surgery.  No gum chewing or hard candies.  You may however, drink CLEAR liquids up to 2 hours before you are scheduled to arrive for your surgery. Do not drink anything within 2 hours of your scheduled arrival time.  Clear liquids include: - water  - apple juice without pulp - gatorade (not RED colors) - black coffee or tea (Do NOT add milk or creamers to the coffee or tea) Do NOT drink anything that is not on this list.  In addition, your doctor has ordered for you to drink the provided:  Ensure Pre-Surgery Clear Carbohydrate Drink  Drinking this carbohydrate drink up to two hours before surgery helps to reduce insulin resistance and improve patient outcomes. Please complete drinking 2 hours before scheduled arrival time.  One week prior to surgery: starting today, September 13 Stop aspirin and Anti-inflammatories (NSAIDS) such as Advil, Aleve, Ibuprofen, Motrin, Naproxen, Naprosyn and Aspirin based products such as Excedrin, Goody's Powder, BC Powder. Stop ANY OVER THE COUNTER supplements until after surgery. Stop elderberry, hair,skin,nail vitamin, omega 3 fish oil, turmeric. You may however, continue to take Tylenol if needed for pain up until the day of surgery.  Continue taking all prescribed medications   TAKE ONLY THESE MEDICATIONS THE MORNING OF SURGERY WITH A SIP OF  WATER:  Albuterol nebulizer Gabapentin Metoprolol Dulera inhaler Omeprazole (Prilosec) - (take one the night before and one on the morning of surgery - helps to prevent nausea after surgery.) Oxycodone if needed for pain Spiriva inhaler  Use inhalers on the day of surgery and bring your albuterol inhaler to the hospital.  No Alcohol for 24 hours before or after surgery.  No Smoking including e-cigarettes for 24 hours before surgery.  No chewable tobacco products for at least 6 hours before surgery.  No nicotine patches on the day of surgery.  Do not use any "recreational" drugs for at least a week (preferably 2 weeks) before your surgery.  Please be advised that the combination of cocaine and anesthesia may have negative outcomes, up to and including death. If you test positive for cocaine, your surgery will be cancelled.  On the morning of surgery brush your teeth with toothpaste and water, you may rinse your mouth with mouthwash if you wish. Do not swallow any toothpaste or mouthwash.  Use CHG Soap as directed on instruction sheet.  Do not wear jewelry, make-up, hairpins, clips or nail polish.  For welded (permanent) jewelry: bracelets, anklets, waist bands, etc.  Please have this removed prior to surgery.  If it is not removed, there is a chance that hospital personnel will need to cut it off on the day of surgery.  Do not wear lotions, powders, or perfumes.   Do not shave body hair from the neck down 48 hours before surgery.  Contact lenses, hearing aids and dentures may not be worn into  surgery.  Do not bring valuables to the hospital. [facility] is not responsible for any missing/lost belongings or valuables.   Notify your doctor if there is any change in your medical condition (cold, fever, infection).  Wear comfortable clothing (specific to your surgery type) to the hospital.  After surgery, you can help prevent lung complications by doing breathing exercises.   Take deep breaths and cough every 1-2 hours. Your doctor may order a device called an Incentive Spirometer to help you take deep breaths.  If you are being discharged the day of surgery, you will not be allowed to drive home. You will need a responsible individual to drive you home and stay with you for 24 hours after surgery.   If you are taking public transportation, you will need to have a responsible individual with you.  Please call the Pre-admissions Testing Dept. at [phone] if you have any questions about these instructions.  Surgery Visitation Policy:  Patients having surgery or a procedure may have two visitors.  Children under the age of 43 must have an adult with them who is not the patient.     Preparing for Surgery with CHLORHEXIDINE GLUCONATE (CHG) Soap  Chlorhexidine Gluconate (CHG) Soap  o An antiseptic cleaner that kills germs and bonds with the skin to continue killing germs even after washing  o Used for showering the night before surgery and morning of surgery  Before surgery, you can play an important role by reducing the number of germs on your skin.  CHG (Chlorhexidine gluconate) soap is an antiseptic cleanser which kills germs and bonds with the skin to continue killing germs even after washing.  Please do not use if you have an allergy to CHG or antibacterial soaps. If your skin becomes reddened/irritated stop using the CHG.  1. Shower the NIGHT BEFORE SURGERY and the MORNING OF SURGERY with CHG soap.  2. If you choose to wash your hair, wash your hair first as usual with your normal shampoo.  3. After shampooing, rinse your hair and body thoroughly to remove the shampoo.  4. Use CHG as you would any other liquid soap. You can apply CHG directly to the skin and wash gently with a scrungie or a clean washcloth.  5. Apply the CHG soap to your body only from the neck down. Do not use on open wounds or open sores. Avoid contact with your eyes, ears,  mouth, and genitals (private parts). Wash face and genitals (private parts) with your normal soap.  6. Wash thoroughly, paying special attention to the area where your surgery will be performed.  7. Thoroughly rinse your body with warm water.  8. Do not shower/wash with your normal soap after using and rinsing off the CHG soap.  9. Pat yourself dry with a clean towel.  10. Wear clean pajamas to bed the night before surgery.  12. Place clean sheets on your bed the night of your first shower and do not sleep with pets.  13. Shower again with the CHG soap on the day of surgery prior to arriving at the hospital.  14. Do not apply any deodorants/lotions/powders.  15. Please wear clean clothes to the hospital.

## 2023-03-10 ENCOUNTER — Encounter
Admission: RE | Admit: 2023-03-10 | Discharge: 2023-03-10 | Disposition: A | Discharge status: Home / Self Care | Payer: 59 | Source: Ambulatory Visit | Attending: Surgery | Admitting: Surgery

## 2023-03-10 DIAGNOSIS — Z01812 Encounter for preprocedural laboratory examination: Secondary | ICD-10-CM

## 2023-03-10 DIAGNOSIS — Z01810 Encounter for preprocedural cardiovascular examination: Secondary | ICD-10-CM | POA: Insufficient documentation

## 2023-03-10 DIAGNOSIS — I10 Essential (primary) hypertension: Secondary | ICD-10-CM | POA: Insufficient documentation

## 2023-03-11 ENCOUNTER — Ambulatory Visit: Payer: 59 | Admitting: Urgent Care

## 2023-03-11 ENCOUNTER — Encounter
Admission: RE | Disposition: A | Discharge status: Home / Self Care | Payer: Self-pay | Source: Ambulatory Visit | Attending: Surgery

## 2023-03-11 ENCOUNTER — Other Ambulatory Visit: Payer: Self-pay

## 2023-03-11 ENCOUNTER — Encounter: Payer: Self-pay | Admitting: Surgery

## 2023-03-11 ENCOUNTER — Ambulatory Visit: Payer: 59 | Admitting: Gastroenterology

## 2023-03-11 ENCOUNTER — Ambulatory Visit
Admission: RE | Admit: 2023-03-11 | Discharge: 2023-03-11 | Disposition: A | Discharge status: Home / Self Care | Payer: 59 | Source: Ambulatory Visit | Attending: Surgery | Admitting: Surgery

## 2023-03-11 DIAGNOSIS — K219 Gastro-esophageal reflux disease without esophagitis: Secondary | ICD-10-CM | POA: Diagnosis not present

## 2023-03-11 DIAGNOSIS — Z8673 Personal history of transient ischemic attack (TIA), and cerebral infarction without residual deficits: Secondary | ICD-10-CM | POA: Diagnosis not present

## 2023-03-11 DIAGNOSIS — M25811 Other specified joint disorders, right shoulder: Secondary | ICD-10-CM | POA: Diagnosis not present

## 2023-03-11 DIAGNOSIS — I739 Peripheral vascular disease, unspecified: Secondary | ICD-10-CM | POA: Insufficient documentation

## 2023-03-11 DIAGNOSIS — Z8619 Personal history of other infectious and parasitic diseases: Secondary | ICD-10-CM | POA: Insufficient documentation

## 2023-03-11 DIAGNOSIS — I252 Old myocardial infarction: Secondary | ICD-10-CM | POA: Diagnosis not present

## 2023-03-11 DIAGNOSIS — F17210 Nicotine dependence, cigarettes, uncomplicated: Secondary | ICD-10-CM | POA: Insufficient documentation

## 2023-03-11 DIAGNOSIS — J45909 Unspecified asthma, uncomplicated: Secondary | ICD-10-CM | POA: Insufficient documentation

## 2023-03-11 DIAGNOSIS — M659 Synovitis and tenosynovitis, unspecified: Secondary | ICD-10-CM | POA: Insufficient documentation

## 2023-03-11 DIAGNOSIS — I2510 Atherosclerotic heart disease of native coronary artery without angina pectoris: Secondary | ICD-10-CM | POA: Insufficient documentation

## 2023-03-11 DIAGNOSIS — E669 Obesity, unspecified: Secondary | ICD-10-CM | POA: Diagnosis not present

## 2023-03-11 DIAGNOSIS — I10 Essential (primary) hypertension: Secondary | ICD-10-CM | POA: Insufficient documentation

## 2023-03-11 DIAGNOSIS — Z8249 Family history of ischemic heart disease and other diseases of the circulatory system: Secondary | ICD-10-CM | POA: Diagnosis not present

## 2023-03-11 DIAGNOSIS — Z6831 Body mass index (BMI) 31.0-31.9, adult: Secondary | ICD-10-CM | POA: Insufficient documentation

## 2023-03-11 HISTORY — PX: SHOULDER ARTHROSCOPY WITH SUBACROMIAL DECOMPRESSION, ROTATOR CUFF REPAIR AND BICEP TENDON REPAIR: SHX5687

## 2023-03-11 SURGERY — SHOULDER ARTHROSCOPY WITH SUBACROMIAL DECOMPRESSION, ROTATOR CUFF REPAIR AND BICEP TENDON REPAIR
Anesthesia: General | Site: Shoulder | Laterality: Right

## 2023-03-11 MED ORDER — KETAMINE HCL 50 MG/5ML IJ SOSY
PREFILLED_SYRINGE | INTRAMUSCULAR | Status: AC
  Filled 2023-03-11: qty 5

## 2023-03-11 MED ORDER — ONDANSETRON HCL 4 MG/2ML IJ SOLN: 4.0000 mg | Freq: Once | INTRAMUSCULAR | Status: DC | PRN

## 2023-03-11 MED ORDER — SODIUM CHLORIDE 0.9 % IV SOLN: INTRAVENOUS | Status: DC

## 2023-03-11 MED ORDER — FENTANYL CITRATE (PF) 100 MCG/2ML IJ SOLN
INTRAMUSCULAR | Status: AC
  Filled 2023-03-11: qty 2

## 2023-03-11 MED ORDER — CHLORHEXIDINE GLUCONATE 0.12 % MT SOLN
OROMUCOSAL | Status: AC
  Filled 2023-03-11: qty 15

## 2023-03-11 MED ORDER — METOCLOPRAMIDE HCL 5 MG/ML IJ SOLN: 5.0000 mg | Freq: Three times a day (TID) | INTRAMUSCULAR | Status: DC | PRN

## 2023-03-11 MED ORDER — ONDANSETRON HCL 4 MG/2ML IJ SOLN: 4.0000 mg | Freq: Four times a day (QID) | INTRAMUSCULAR | Status: DC | PRN

## 2023-03-11 MED ORDER — IPRATROPIUM-ALBUTEROL 0.5-2.5 (3) MG/3ML IN SOLN
3.0000 mL | Freq: Once | RESPIRATORY_TRACT | Status: AC
  Administered 2023-03-11: 3 mL via RESPIRATORY_TRACT

## 2023-03-11 MED ORDER — ACETAMINOPHEN 10 MG/ML IV SOLN: 1000.0000 mg | Freq: Once | INTRAVENOUS | Status: DC | PRN

## 2023-03-11 MED ORDER — BUPIVACAINE-EPINEPHRINE (PF) 0.5% -1:200000 IJ SOLN
INTRAMUSCULAR | Status: AC
  Filled 2023-03-11: qty 10

## 2023-03-11 MED ORDER — ONDANSETRON HCL 4 MG PO TABS: 4.0000 mg | ORAL_TABLET | Freq: Four times a day (QID) | ORAL | Status: DC | PRN

## 2023-03-11 MED ORDER — DEXMEDETOMIDINE HCL IN NACL 200 MCG/50ML IV SOLN
INTRAVENOUS | Status: DC | PRN
Start: 2023-03-11 — End: 2023-03-11
  Administered 2023-03-11: 8 ug via INTRAVENOUS
  Administered 2023-03-11: 12 ug via INTRAVENOUS

## 2023-03-11 MED ORDER — ONDANSETRON HCL 4 MG/2ML IJ SOLN
INTRAMUSCULAR | Status: DC | PRN
  Administered 2023-03-11 (×2): 4 mg via INTRAVENOUS

## 2023-03-11 MED ORDER — ACETAMINOPHEN 325 MG PO TABS: 325.0000 mg | ORAL_TABLET | Freq: Four times a day (QID) | ORAL | Status: DC | PRN

## 2023-03-11 MED ORDER — OXYCODONE HCL 5 MG PO TABS: 5.0000 mg | ORAL_TABLET | ORAL | 0 refills | Status: DC | PRN

## 2023-03-11 MED ORDER — METOCLOPRAMIDE HCL 10 MG PO TABS: 5.0000 mg | ORAL_TABLET | Freq: Three times a day (TID) | ORAL | Status: DC | PRN

## 2023-03-11 MED ORDER — CEFAZOLIN SODIUM-DEXTROSE 2-4 GM/100ML-% IV SOLN
INTRAVENOUS | Status: AC
  Filled 2023-03-11: qty 100

## 2023-03-11 MED ORDER — ORAL CARE MOUTH RINSE: 15.0000 mL | Freq: Once | OROMUCOSAL | Status: AC

## 2023-03-11 MED ORDER — EPINEPHRINE PF 1 MG/ML IJ SOLN
INTRAMUSCULAR | Status: AC
  Filled 2023-03-11: qty 1

## 2023-03-11 MED ORDER — OXYCODONE HCL 5 MG PO TABS: 10.0000 mg | ORAL_TABLET | ORAL | Status: DC | PRN

## 2023-03-11 MED ORDER — LACTATED RINGERS IV SOLN: INTRAVENOUS | Status: DC

## 2023-03-11 MED ORDER — SUGAMMADEX SODIUM 200 MG/2ML IV SOLN
INTRAVENOUS | Status: DC | PRN
  Administered 2023-03-11: 200 mg via INTRAVENOUS

## 2023-03-11 MED ORDER — BUPIVACAINE LIPOSOME 1.3 % IJ SUSP
INTRAMUSCULAR | Status: DC | PRN
Start: 2023-03-11 — End: 2023-03-11
  Administered 2023-03-11: 20 mL

## 2023-03-11 MED ORDER — FENTANYL CITRATE (PF) 100 MCG/2ML IJ SOLN
INTRAMUSCULAR | Status: DC | PRN
  Administered 2023-03-11 (×2): 50 ug via INTRAVENOUS

## 2023-03-11 MED ORDER — ROCURONIUM BROMIDE 100 MG/10ML IV SOLN
INTRAVENOUS | Status: DC | PRN
  Administered 2023-03-11: 50 mg via INTRAVENOUS

## 2023-03-11 MED ORDER — ALBUTEROL SULFATE HFA 108 (90 BASE) MCG/ACT IN AERS
INHALATION_SPRAY | RESPIRATORY_TRACT | Status: DC | PRN
Start: 2023-03-11 — End: 2023-03-11
  Administered 2023-03-11: 2 via RESPIRATORY_TRACT

## 2023-03-11 MED ORDER — OXYCODONE HCL 5 MG PO TABS
ORAL_TABLET | ORAL | Status: AC
  Filled 2023-03-11: qty 1

## 2023-03-11 MED ORDER — GLYCOPYRROLATE 0.2 MG/ML IJ SOLN
INTRAMUSCULAR | Status: DC | PRN
  Administered 2023-03-11: .2 mg via INTRAVENOUS

## 2023-03-11 MED ORDER — CEFAZOLIN SODIUM-DEXTROSE 2-4 GM/100ML-% IV SOLN
2.0000 g | INTRAVENOUS | Status: AC
  Administered 2023-03-11: 2 g via INTRAVENOUS

## 2023-03-11 MED ORDER — LABETALOL HCL 5 MG/ML IV SOLN
INTRAVENOUS | Status: DC | PRN
Start: 2023-03-11 — End: 2023-03-11
  Administered 2023-03-11: 5 mg via INTRAVENOUS

## 2023-03-11 MED ORDER — DEXAMETHASONE SODIUM PHOSPHATE 10 MG/ML IJ SOLN
INTRAMUSCULAR | Status: DC | PRN
  Administered 2023-03-11: 10 mg via INTRAVENOUS

## 2023-03-11 MED ORDER — BUPIVACAINE-EPINEPHRINE 0.5% -1:200000 IJ SOLN
INTRAMUSCULAR | Status: DC | PRN
  Administered 2023-03-11: 30 mL

## 2023-03-11 MED ORDER — ACETAMINOPHEN 10 MG/ML IV SOLN
INTRAVENOUS | Status: AC
  Filled 2023-03-11: qty 100

## 2023-03-11 MED ORDER — OXYCODONE HCL 5 MG/5ML PO SOLN: 5.0000 mg | Freq: Once | ORAL | Status: AC | PRN

## 2023-03-11 MED ORDER — MIDAZOLAM HCL 2 MG/2ML IJ SOLN
INTRAMUSCULAR | Status: AC
  Filled 2023-03-11: qty 2

## 2023-03-11 MED ORDER — LIDOCAINE HCL (CARDIAC) PF 100 MG/5ML IV SOSY
PREFILLED_SYRINGE | INTRAVENOUS | Status: DC | PRN
  Administered 2023-03-11: 20 mg via INTRAVENOUS

## 2023-03-11 MED ORDER — RINGERS IRRIGATION IR SOLN
Status: DC | PRN
  Administered 2023-03-11: 6000 mL

## 2023-03-11 MED ORDER — PROPOFOL 10 MG/ML IV BOLUS
INTRAVENOUS | Status: DC | PRN
  Administered 2023-03-11 (×2): 50 mg via INTRAVENOUS
  Administered 2023-03-11: 150 mg via INTRAVENOUS

## 2023-03-11 MED ORDER — IPRATROPIUM-ALBUTEROL 0.5-2.5 (3) MG/3ML IN SOLN
RESPIRATORY_TRACT | Status: AC
  Filled 2023-03-11: qty 3

## 2023-03-11 MED ORDER — ESMOLOL HCL 100 MG/10ML IV SOLN
INTRAVENOUS | Status: DC | PRN
Start: 2023-03-11 — End: 2023-03-11
  Administered 2023-03-11: 40 mg via INTRAVENOUS

## 2023-03-11 MED ORDER — OXYCODONE HCL 5 MG PO TABS
5.0000 mg | ORAL_TABLET | Freq: Once | ORAL | Status: AC | PRN
  Administered 2023-03-11: 5 mg via ORAL

## 2023-03-11 MED ORDER — MIDAZOLAM HCL 2 MG/2ML IJ SOLN
INTRAMUSCULAR | Status: DC | PRN
  Administered 2023-03-11: 2 mg via INTRAVENOUS

## 2023-03-11 MED ORDER — FENTANYL CITRATE (PF) 100 MCG/2ML IJ SOLN
25.0000 ug | INTRAMUSCULAR | Status: DC | PRN
  Administered 2023-03-11: 50 ug via INTRAVENOUS

## 2023-03-11 MED ORDER — KETAMINE HCL 10 MG/ML IJ SOLN
INTRAMUSCULAR | Status: DC | PRN
Start: 2023-03-11 — End: 2023-03-11
  Administered 2023-03-11 (×2): 25 mg via INTRAVENOUS

## 2023-03-11 MED ORDER — ACETAMINOPHEN 10 MG/ML IV SOLN
INTRAVENOUS | Status: DC | PRN
  Administered 2023-03-11: 1000 mg via INTRAVENOUS

## 2023-03-11 MED ORDER — BUPIVACAINE LIPOSOME 1.3 % IJ SUSP
INTRAMUSCULAR | Status: AC
  Filled 2023-03-11: qty 20

## 2023-03-11 MED ORDER — CHLORHEXIDINE GLUCONATE 0.12 % MT SOLN
15.0000 mL | Freq: Once | OROMUCOSAL | Status: AC
  Administered 2023-03-11: 15 mL via OROMUCOSAL

## 2023-03-11 SURGICAL SUPPLY — 49 items
ANCH SUT 2 2.9 2 LD TPR NDL (Anchor) ×1 IMPLANT
ANCHOR JUGGERKNOT WTAP NDL 2.9 (Anchor) IMPLANT
APL PRP STRL LF DISP 70% ISPRP (MISCELLANEOUS) ×2
BIT DRILL JUGRKNT W/NDL BIT2.9 (DRILL) IMPLANT
BLADE FULL RADIUS 3.5 (BLADE) ×1 IMPLANT
BUR ACROMIONIZER 4.0 (BURR) ×1 IMPLANT
CHLORAPREP W/TINT 26 (MISCELLANEOUS) ×1 IMPLANT
COVER MAYO STAND STRL (DRAPES) ×1 IMPLANT
DILATOR 5.5 THREADED HEALICOIL (MISCELLANEOUS) IMPLANT
DRILL JUGGERKNOT W/NDL BIT 2.9 (DRILL) ×1
ELECT CAUTERY BLADE 6.4 (BLADE) ×1 IMPLANT
ELECT REM PT RETURN 9FT ADLT (ELECTROSURGICAL) ×1
ELECTRODE REM PT RTRN 9FT ADLT (ELECTROSURGICAL) ×1 IMPLANT
GAUZE SPONGE 4X4 12PLY STRL (GAUZE/BANDAGES/DRESSINGS) ×1 IMPLANT
GAUZE XEROFORM 1X8 LF (GAUZE/BANDAGES/DRESSINGS) ×1 IMPLANT
GLOVE BIO SURGEON STRL SZ7.5 (GLOVE) ×2 IMPLANT
GLOVE BIO SURGEON STRL SZ8 (GLOVE) ×2 IMPLANT
GLOVE BIOGEL PI IND STRL 8 (GLOVE) ×1 IMPLANT
GLOVE INDICATOR 8.0 STRL GRN (GLOVE) ×1 IMPLANT
GOWN STRL REUS W/ TWL LRG LVL3 (GOWN DISPOSABLE) ×1 IMPLANT
GOWN STRL REUS W/ TWL XL LVL3 (GOWN DISPOSABLE) ×1 IMPLANT
GOWN STRL REUS W/TWL LRG LVL3 (GOWN DISPOSABLE) ×1
GOWN STRL REUS W/TWL XL LVL3 (GOWN DISPOSABLE) ×1
GRASPER SUT 15 45D LOW PRO (SUTURE) IMPLANT
IV LACTATED RINGER IRRG 3000ML (IV SOLUTION) ×2
IV LR IRRIG 3000ML ARTHROMATIC (IV SOLUTION) ×2 IMPLANT
KIT CANNULA 8X76-LX IN CANNULA (CANNULA) ×1 IMPLANT
MANIFOLD NEPTUNE II (INSTRUMENTS) ×2 IMPLANT
MASK FACE SPIDER DISP (MASK) ×1 IMPLANT
MAT ABSORB FLUID 56X50 GRAY (MISCELLANEOUS) ×1 IMPLANT
PACK ARTHROSCOPY SHOULDER (MISCELLANEOUS) ×1 IMPLANT
PAD ABD DERMACEA PRESS 5X9 (GAUZE/BANDAGES/DRESSINGS) ×2 IMPLANT
PASSER SUT FIRSTPASS SELF (INSTRUMENTS) IMPLANT
SLEEVE REMOTE CONTROL 5X12 (DRAPES) IMPLANT
SLING ARM LRG DEEP (SOFTGOODS) ×1 IMPLANT
SLING ULTRA II LG (MISCELLANEOUS) ×1 IMPLANT
SPONGE T-LAP 18X18 ~~LOC~~+RFID (SPONGE) ×1 IMPLANT
STAPLER SKIN PROX 35W (STAPLE) ×1 IMPLANT
STRAP SAFETY 5IN WIDE (MISCELLANEOUS) ×1 IMPLANT
SUT ETHIBOND 0 MO6 C/R (SUTURE) ×1 IMPLANT
SUT ULTRABRAID 2 COBRAID 38 (SUTURE) IMPLANT
SUT VIC AB 2-0 CT1 27 (SUTURE) ×2
SUT VIC AB 2-0 CT1 TAPERPNT 27 (SUTURE) ×2 IMPLANT
TAPE MICROFOAM 4IN (TAPE) ×1 IMPLANT
TRAP FLUID SMOKE EVACUATOR (MISCELLANEOUS) ×1 IMPLANT
TUBE SET DOUBLEFLO INFLOW (TUBING) ×1 IMPLANT
TUBING CONNECTING 10 (TUBING) ×1 IMPLANT
WAND WEREWOLF FLOW 90D (MISCELLANEOUS) ×1 IMPLANT
WATER STERILE IRR 500ML POUR (IV SOLUTION) ×1 IMPLANT

## 2023-03-11 NOTE — Discharge Instructions (Addendum)
Orthopedic discharge instructions: Keep dressing dry and intact.  May shower after dressing changed on post-op day #4 (Saturday).  Cover staples with Band-Aids after drying off. Apply ice frequently to shoulder. Take ibuprofen 600 mg TID with meals for 3-5 days, then as necessary. Take oxycodone as prescribed when needed.  May supplement with ES Tylenol if necessary. Keep shoulder immobilizer on at all times except may remove for bathing purposes. Follow-up in 10-14 days or as scheduled.  AMBULATORY SURGERY  DISCHARGE INSTRUCTIONS   The drugs that you were given will stay in your system until tomorrow so for the next 24 hours you should not:  Drive an automobile Make any legal decisions Drink any alcoholic beverage   You may resume regular meals tomorrow.  Today it is better to start with liquids and gradually work up to solid foods.  You may eat anything you prefer, but it is better to start with liquids, then soup and crackers, and gradually work up to solid foods.   Please notify your doctor immediately if you have any unusual bleeding, trouble breathing, redness and pain at the surgery site, drainage, fever, or pain not relieved by medication.    Additional Instructions: PLEASE LEAVE EXPAREL (TEAL) ARMBAND ON FOR 4 DAYS   Please contact your physician with any problems or Same Day Surgery at [phone], Monday through Friday 6 am to 4 pm, or [place] at [facility] number at [phone].  SHOULDER SLING IMMOBILIZER   VIDEO Slingshot 2 Shoulder Brace Application - YouTube ---[email or web address]  INSTRUCTIONS While supporting the injured arm, slide the forearm into the sling. Wrap the adjustable shoulder strap around the neck and shoulders and attach the strap end to the sling using  the "alligator strap tab."  Adjust the shoulder strap to the required length. Position the shoulder pad behind the neck. To secure the shoulder pad location  (optional), pull the shoulder strap away from the shoulder pad, unfold the hook material on the top of the pad, then press the shoulder strap back onto the hook material to secure the pad in place. Attach the closure strap across the open top of the sling. Position the strap so that it holds the arm securely in the sling. Next, attach the thumb strap to the open end of the sling between the thumb and fingers. After sling has been fit, it may be easily removed and reapplied using the quick release buckle on shoulder strap. If a neutral pillow or 15 abduction pillow is included, place the pillow at the waistline. Attach the sling to the pillow, lining up hook material on the pillow with the loop on sling. Adjust the waist strap to fit.  If waist strap is too long, cut it to fit. Use the small piece of double sided hook material (located on top of the pillow) to secure the strap end. Place the double sided hook material on the inside of the cut strap end and secure it to the waist strap.     If no pillow is included, attach the waist strap to the sling and adjust to fit.    Washing Instructions: Straps and sling must be removed and cleaned regularly depending on your activity level and perspiration. Hand wash straps and sling in cold water with mild detergent, rinse, air dry

## 2023-03-11 NOTE — Anesthesia Procedure Notes (Signed)
Procedure Name: Intubation Date/Time: 03/11/2023 1:30 PM  Performed by: Mohammed Kindle, CRNAPre-anesthesia Checklist: Patient identified, Emergency Drugs available, Suction available, Patient being monitored and Timeout performed Patient Re-evaluated:Patient Re-evaluated prior to induction Oxygen Delivery Method: Circle system utilized Preoxygenation: Pre-oxygenation with 100% oxygen Induction Type: IV induction Ventilation: Mask ventilation without difficulty Laryngoscope Size: McGraph and 3 Grade View: Grade I Tube type: Oral Tube size: 7.0 mm Number of attempts: 1 Airway Equipment and Method: Stylet and Oral airway Placement Confirmation: ETT inserted through vocal cords under direct vision, positive ETCO2, breath sounds checked- equal and bilateral and CO2 detector Secured at: 21 cm Tube secured with: Tape Dental Injury: Teeth and Oropharynx as per pre-operative assessment

## 2023-03-11 NOTE — H&P (Signed)
History of Present Illness:  Sara Munoz is a 57 y.o. female who presents for follow-up of her continued right shoulder pain. The patient was last seen for these symptoms 6 weeks ago. At this visit, she was diagnosed with impingement/tendinopathy with a possible rotator cuff tear and sent for an MRI scan to better evaluate the rotator cuff for evidence of tearing. She continues to note moderate to severe pain in her shoulder which she rates as high as 8/10, and for which she has been taking Tylenol as necessary with limited benefit. She has difficultly with activities at or above shoulder level, and has pain at night, especially if she tries to sleep on her right side. She denies any reinjury to the right shoulder. Since her last visit, she has undergone an MRI scan and presents today to review these results.  Current Outpatient Medications:  albuterol 90 mcg/actuation inhaler Inhale into the lungs.  amLODIPine (NORVASC) 5 MG tablet Take 5 mg by mouth once daily  aspirin 81 MG EC tablet Take by mouth.  atorvastatin (LIPITOR) 20 MG tablet Take by mouth  budesonide-formoterol (SYMBICORT) 160-4.5 mcg/actuation inhaler Inhale into the lungs.  diazePAM (VALIUM) 5 MG tablet Take one tablet 30 minutes prior to MRI scan and one at time or MRI as needed for anxiety. 2 tablet 0  gabapentin (NEURONTIN) 600 MG tablet Take 600 mg by mouth 3 (three) times daily  lactulose (ENULOSE) 10 gram/15 mL oral solution Take 30 mLs by mouth 4 (four) times daily  LINZESS 290 mcg capsule Take 290 mcg by mouth once daily  lisinopril (PRINIVIL,ZESTRIL) 40 MG tablet Take 40 mg by mouth  metoprolol tartrate (LOPRESSOR) 25 MG tablet Take by mouth.  mometasone-formoterol (DULERA) 100-5 mcg/actuation inhaler Inhale 2 inhalations into the lungs 2 (two) times daily  naloxone 0.4 mg/0.4 mL AtIn Inject as directed  nitroGLYcerin (NITROSTAT) 0.4 MG SL tablet Place 1 tablet (0.4 mg total) under the tongue every 5 (five) minutes as needed  for Chest pain For up to 3 doses 25 tablet 0  omeprazole (PRILOSEC) 40 MG DR capsule Take 40 mg by mouth once daily  ondansetron (ZOFRAN) 8 MG tablet Take 8 mg by mouth every 8 (eight) hours as needed  oxyCODONE (ROXICODONE) 15 MG immediate release tablet Take 15 mg by mouth every 4 (four) hours as needed for Pain  predniSONE (DELTASONE) 5 MG tablet Taper over 6 days as instructed. 21 tablet 0  SPIRIVA RESPIMAT 2.5 mcg/actuation inhalation spray TAKE 1 PUFF BY MOUTH EVERY DAY  sucralfate (CARAFATE) 100 mg/mL suspension Take 1 g by mouth 4 (four) times daily  tiotropium (SPIRIVA) 18 mcg inhalation capsule Place 18 mcg into inhaler and inhale once daily   Allergies:  Cephalexin Hives  Chlorpheniramine Maleate Unknown  Cyclobenzaprine (Hives, Swelling, and Facial/lip swelling)  Ketorolac Tromethamine Other (Swelling and Facial/tongue swelling)  Levofloxacin Hives  Phenylephrine Hcl Unknown  Sertraline Hcl Other (Swelling and Tongue swelling)  Tramadol Hives, Other (Swelling and Lip swelling)   Past Medical History:  Anemia  Chronic constipation  Chronic hip pain  Chronic nausea  Chronic pain syndrome  Chronic radicular lumbar pain  COPD (chronic obstructive pulmonary disease) (CMS/HHS-HCC)  Coronary artery disease  Costochondritis  GERD (gastroesophageal reflux disease)  Hyperlipidemia  Hypertension  Liver disease  MI (myocardial infarction) (CMS/HHS-HCC)  Narcotic abuse (CMS/HHS-HCC)  Narcotic dependence (CMS/HHS-HCC)  Neurogenic pain  Osteoarthritis  Polysubstance (excluding opioids) dependence (CMS/HHS-HCC)  PVD (peripheral vascular disease) (CMS-HCC)  Sleep apnea  TIA (transient ischemic attack)  Past Surgical History:  Extensive arthroscopic debridement, arthroscopic subacromial decompression, mini-open rotator cuff repair using a [employer] Regeneten patch, and mini-open biceps tenodesis, left shoulder. Left 05/28/2022 (Dr. Joice Lofts)  Extensive arthroscopic  debridement with removal of retained foreign body, left shoulder. Left 12/03/2022 (Dr. Joice Lofts)  CARDIAC CATHETERIZATION  TIBULA  UNLISTED PROCEDURE FEMUR/KNEE  LEFT   Family History:  Cancer Mother  Cancer Father   Social History:   Socioeconomic History:  Marital status: Single  Tobacco Use  Smoking status: Every Day  Smokeless tobacco: Never  Vaping Use  Vaping status: Never Used  Substance and Sexual Activity  Alcohol use: Yes  Drug use: No  Sexual activity: Defer   Social Determinants of Health:   Financial Resource Strain: Low Risk (01/01/2023)  Received from [facility]  Overall Financial Resource Strain (CARDIA)  Difficulty of Paying Living Expenses: Not very hard  Food Insecurity: No Food Insecurity (10/23/2022)  Received from [facility]  Hunger Vital Sign  Worried About Running Out of Food in the Last Year: Never true  Ran Out of Food in the Last Year: Never true  Transportation Needs: No Transportation Needs (01/01/2023)  Received from [facility] - Transportation  Lack of Transportation (Medical): No  Lack of Transportation (Non-Medical): No   Review of Systems:  A comprehensive 14 point ROS was performed, reviewed, and the pertinent orthopaedic findings are documented in the HPI.  Physical Exam: Vitals:  02/28/23 1417  BP: 136/84  Weight: 83.5 kg (184 lb)  Height: 162.6 cm (5\' 4" )  PainSc: 8  PainLoc: Shoulder   General/Constitutional: The patient appears to be well-nourished, well-developed, and in no acute distress. Neuro/Psych: Normal mood and affect, oriented to person, place and time. Eyes: Non-icteric. Pupils are equal, round, and reactive to light, and exhibit synchronous movement. ENT: Unremarkable. Lymphatic: No palpable adenopathy. Respiratory: Lungs clear to auscultation, Normal chest excursion, No wheezes, and Non-labored breathing Cardiovascular: Regular rate and rhythm. No murmurs. and No edema, swelling or tenderness, except  as noted in detailed exam. Integumentary: No impressive skin lesions present, except as noted in detailed exam. Musculoskeletal: Unremarkable, except as noted in detailed exam.  Right shoulder exam:  Skin inspection of the right shoulder again is unremarkable. No swelling, erythema, ecchymosis, abrasions, or other skin abnormalities are identified. She ranges moderate tenderness to palpation over the anterolateral aspect of the shoulder, as well as along the bicipital groove and over the posterior lateral shoulder region. Actively, she can forward flex to 140 degrees, abduct to 135 degrees, and internally rotate to L3. Passively, she is able to tolerate forward flexion to 145 degrees and abduction to 135 degrees. At 90 degrees of abduction, she is able to tolerate external rotation to 80 degrees and internal rotation to 60 degrees. She experiences mild-moderate pain with all motions. She demonstrates 4-4+/5 strength with resisted forward flexion and abduction, as well as with resisted internal and external rotation. She describes moderate pain with resisted forward flexion, abduction, and external rotation. She remains neurovascularly intact to the right upper extremity and hand.  X-rays/MRI/Lab data:  A recent MRI scan of the right shoulder is available for review and has been reviewed by myself. By report, the scan demonstrates evidence of "moderate tendinopathy" involving both the supraspinatus and infraspinatus tendons with an interstitial partial-thickness tear involving the insertional fibers of the supraspinatus tendon. The subscapularis tendon demonstrates mild tendinopathic changes. The biceps tendon has some moderate tendinopathic changes involving the intra-articular portion of the long head  of the biceps tendon. No significant degenerative changes are identified. Both the films and report were reviewed by myself and discussed with the patient.  Assessment: 1. Tendinitis of upper biceps tendon  of right shoulder  2. Rotator cuff tendinitis, right   Plan: The treatment options were discussed with the patient. In addition, patient educational materials were provided regarding the diagnosis and treatment options. Regarding her right shoulder symptoms, the patient is quite frustrated by her symptoms and functional limitations, and is ready to consider more aggressive treatment options. Therefore, I have recommended a surgical procedure, specifically a right shoulder arthroscopy with debridement, decompression, possible rotator cuff repair, and biceps tenodesis versus tenolysis. The procedure was discussed with the patient, as were the potential risks (including bleeding, infection, nerve and/or blood vessel injury, persistent or recurrent pain, failure of the repair, progression of arthritis, need for further surgery, blood clots, strokes, heart attacks and/or arhythmias, pneumonia, etc.) and benefits. The patient states her understanding and wishes to proceed. All of the patient's questions and concerns were answered. She can call any time with further concerns. She will follow up post-surgery, routine.    H&P reviewed and patient re-examined. No changes.

## 2023-03-11 NOTE — Op Note (Signed)
03/11/2023  3:18 PM  Patient:   Sara Munoz  Pre-Op Diagnosis:   Impingement/tendinopathy with possible partial-thickness rotator cuff tear and biceps tendinopathy, right shoulder.  Post-Op Diagnosis:   Impingement/tendinopathy and biceps tendinopathy, right shoulder.  Procedure:   Limited arthroscopic debridement, arthroscopic subacromial decompression, and mini-open biceps tenodesis, right shoulder.  Anesthesia:   GET  Surgeon:   Maryagnes Amos, MD  Assistant:   Walker Shadow, PA-S  Findings:   As above. The rotator cuff was in satisfactory condition, as were the articular surfaces of the glenoid and humerus. There was moderate tendinopathy of the biceps tendon with partial-thickness tearing near the labral anchor. There was moderate fraying of the labrum superiorly and posterior superiorly without frank detachment from the glenoid rim.  Complications:   None  Fluids:   700 cc  Estimated blood loss:   50 cc  Tourniquet time:   None  Drains:   None  Closure:   Staples      Brief clinical note:   The patient is a 57 year old female with a history of right shoulder pain. The patient's symptoms have progressed despite medications, activity modification, etc. The patient's history and examination are consistent with impingement/tendinopathy with a possible rotator cuff tear. These findings were confirmed by MRI scan. The patient presents at this time for definitive management of these shoulder symptoms.  Procedure:   The patient was brought into the operating room and lain in the supine position. The patient then underwent general endotracheal intubation and anesthesia before being repositioned in the beach chair position using the beach chair positioner. The right shoulder and upper extremity were prepped with ChloraPrep solution before being draped sterilely. Preoperative antibiotics were administered. A timeout was performed to confirm the proper surgical site before the expected  portal sites and incision site were injected with 0.5% Sensorcaine with epinephrine.   A posterior portal was created and the glenohumeral joint thoroughly inspected with the findings as described above. An anterior portal was created using an outside-in technique. The labrum and rotator cuff were further probed, again confirming the above-noted findings. The areas of labral fraying were debrided back to stable margins using the full-radius resector, as were areas of synovitis. The ArthroCare wand was inserted and used to release the biceps tendon from its labral anchor.  It also was used to obtain hemostasis as well as to "anneal" the labrum superiorly and anteriorly. The instruments were removed from the joint after suctioning the excess fluid.  The camera was repositioned through the posterior portal into the subacromial space. A separate lateral portal was created using an outside-in technique. The 3.5 mm full-radius resector was introduced and used to perform a subtotal bursectomy. The ArthroCare wand was then inserted and used to remove the periosteal tissue off the undersurface of the anterior third of the acromion as well as to recess the coracoacromial ligament from its attachment along the anterior and lateral margins of the acromion. The 4.0 mm acromionizing bur was introduced and used to complete the decompression by removing the undersurface of the anterior third of the acromion. The full radius resector was reintroduced to remove any residual bony debris before the ArthroCare wand was reintroduced to obtain hemostasis. The instruments were then removed from the subacromial space after suctioning the excess fluid.  An approximately 4-5 cm incision was made over the anterolateral aspect of the shoulder beginning at the anterolateral corner of the acromion and extending distally in line with the bicipital groove. This incision was  carried down through the subcutaneous tissues to expose the deltoid  fascia. The raphae between the anterior and middle thirds was identified and this plane developed to provide access into the subacromial space. Additional bursal tissues were debrided sharply using Metzenbaum scissors. The rotator cuff was carefully inspected, both visually and by palpation, and found to be intact on its bursal side.  The bicipital groove was identified by palpation and opened for 1-1.5 cm. The biceps tendon stump was retrieved through this defect. The floor of the bicipital groove was roughened with a curet before a [employer] 2.9 mm JuggerKnot anchor was inserted. Both sets of sutures were passed through the biceps tendon and tied securely to effect the tenodesis. The bicipital sheath was reapproximated using two #0 Ethibond interrupted sutures, incorporating the biceps tendon to further reinforce the tenodesis.  The wound was copiously irrigated with sterile saline solution before the deltoid raphae was reapproximated using 2-0 Vicryl interrupted sutures. The subcutaneous tissues were closed in two layers using 2-0 Vicryl interrupted sutures before the skin was closed using staples. The portal sites also were closed using staples. A sterile bulky dressing was applied to the shoulder before the arm was placed into a shoulder immobilizer. The patient was then awakened, extubated, and returned to the recovery room in satisfactory condition after tolerating the procedure well.

## 2023-03-11 NOTE — Transfer of Care (Signed)
Immediate Anesthesia Transfer of Care Note  Patient: Sara Munoz  Procedure(s) Performed: RIGHT SHOULDER ARTHROSCOPY WITH DEBRIDMENT, DECOMPRESSION and BICEPS TENODESIS. (Right: Shoulder)  Patient Location: PACU  Anesthesia Type:General  Level of Consciousness: drowsy and patient cooperative  Airway & Oxygen Therapy: Patient Spontanous Breathing and Patient connected to face mask oxygen  Post-op Assessment: Report given to RN and Post -op Vital signs reviewed and stable  Post vital signs: Reviewed and stable  Last Vitals:  Vitals Value Taken Time  BP 130/70 03/11/23 1515  Temp    Pulse 81 03/11/23 1518  Resp 17 03/11/23 1518  SpO2 100 % 03/11/23 1518  Vitals shown include unfiled device data.  Last Pain:  Vitals:   03/11/23 1053  TempSrc: Temporal  PainSc: 9          Complications: No notable events documented.

## 2023-03-11 NOTE — Anesthesia Preprocedure Evaluation (Addendum)
Anesthesia Evaluation  Patient identified by MRN, date of birth, ID band Patient awake    Reviewed: Allergy & Precautions, NPO status , Patient's Chart, lab work & pertinent test results  History of Anesthesia Complications Negative for: history of anesthetic complications  Airway Mallampati: I   Neck ROM: Full    Dental  (+) Upper Dentures, Lower Dentures   Pulmonary asthma , COPD, Current Smoker (less tha 1 ppd) and Patient abstained from smoking.   + rhonchi        Cardiovascular hypertension, + CAD (s/p MI; nonobstructive CAD) and + Peripheral Vascular Disease  Normal cardiovascular exam Rhythm:Regular Rate:Normal  ECG 03/10/23: normal  TRANSTHORACIC ECHOCARDIOGRAM performed of 10/21/2022 1. Left ventricular ejection fraction, by estimation, is 60 to 65%. The left ventricle has normal function. The left ventricle has no regional  wall motion abnormalities. There is mild left ventricular hypertrophy. Left ventricular diastolic parameters were normal.  2. Right ventricular systolic function is normal. The right ventricular size is normal.  3. The mitral valve is normal in structure. Mild mitral valve regurgitation.  4. The aortic valve was not well visualized. Aortic valve regurgitation is not visualized. No aortic stenosis is present.  5. The inferior vena cava is normal in size with greater than 50% respiratory variability, suggesting right atrial pressure of 3 mmHg.    MYOCARDIAL PERFUSION IMAGING STUDY ([facility]) performed on 09/01/2019 1. Normal left ventricular systolic function with a normal LVEF of 55-65% 2. Normal myocardial thickening and wall motion 3. Left ventricular cavity size normal 4. SPECT images demonstrate homogenous tracer distribution throughout the myocardium 5. No evidence of stress-induced myocardial ischemia or arrhythmia 6. Normal low risk study    Neuro/Psych  PSYCHIATRIC DISORDERS (PTSD) Anxiety      TIA   GI/Hepatic ,GERD  ,,(+) Hepatitis -, C  Endo/Other  Obesity   Renal/GU negative Renal ROS     Musculoskeletal   Abdominal   Peds  Hematology negative hematology ROS (+)   Anesthesia Other Findings Reviewed and agree with Edd Fabian pre-anesthesia clinical review note.    Cardiology note 11/01/22:  1. Mild to nonobstructive CAD (LM, LAD, LCx), cont Lipitor 40 mg daily, continue aspirin 81.  Echo with EF 60 to 65%. 2. Hyperlipidemia, continue Lipitor 40 mg as above.  Lipid panel in 3 months. 3. Hypertension, BP controlled, continue lisinopril 20 mg daily, Lopressor, Norvasc. 4. Current smoker, smoking cessation advised.   Follow-up in 6 months.   Reproductive/Obstetrics                             Anesthesia Physical Anesthesia Plan  ASA: 3  Anesthesia Plan: General   Post-op Pain Management:    Induction: Intravenous  PONV Risk Score and Plan: 2 and Ondansetron, Dexamethasone and Treatment may vary due to age or medical condition  Airway Management Planned: Oral ETT  Additional Equipment:   Intra-op Plan:   Post-operative Plan: Extubation in OR  Informed Consent: I have reviewed the patients History and Physical, chart, labs and discussed the procedure including the risks, benefits and alternatives for the proposed anesthesia with the patient or authorized representative who has indicated his/her understanding and acceptance.     Dental advisory given  Plan Discussed with: CRNA  Anesthesia Plan Comments: (Discussed preoperative interscalene nerve block, but patient refuses due to concerns for worsening pulmonary status.  Patient consented for risks of anesthesia including but not limited to:  - adverse reactions  to medications - damage to eyes, teeth, lips or other oral mucosa - nerve damage due to positioning  - sore throat or hoarseness - damage to heart, brain, nerves, lungs, other parts of body or loss of  life  Informed patient about role of CRNA in peri- and intra-operative care.  Patient voiced understanding.)        Anesthesia Quick Evaluation

## 2023-03-12 ENCOUNTER — Ambulatory Visit: Payer: 59 | Admitting: Obstetrics and Gynecology

## 2023-03-12 ENCOUNTER — Ambulatory Visit: Payer: 59 | Admitting: Neurosurgery

## 2023-03-12 ENCOUNTER — Encounter: Payer: Self-pay | Admitting: Surgery

## 2023-03-12 NOTE — Anesthesia Postprocedure Evaluation (Signed)
Anesthesia Post Note  Patient: Sara Munoz  Procedure(s) Performed: RIGHT SHOULDER ARTHROSCOPY WITH DEBRIDMENT, DECOMPRESSION and BICEPS TENODESIS. (Right: Shoulder)  Patient location during evaluation: PACU Anesthesia Type: General Level of consciousness: awake and alert Pain management: pain level controlled Vital Signs Assessment: post-procedure vital signs reviewed and stable Respiratory status: spontaneous breathing, nonlabored ventilation, respiratory function stable and patient connected to nasal cannula oxygen Cardiovascular status: blood pressure returned to baseline and stable Postop Assessment: no apparent nausea or vomiting Anesthetic complications: no   No notable events documented.   Last Vitals:  Vitals:   03/11/23 1615 03/11/23 1635  BP: (!) 153/83 (!) 166/88  Pulse: 78 67  Resp: 14 16  Temp: (!) 36.4 C (!) 36.3 C  SpO2: 99% 99%    Last Pain:  Vitals:   03/12/23 0918  TempSrc:   PainSc: 2                  Lenard Simmer
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
## 2023-03-17 ENCOUNTER — Encounter: Payer: Self-pay | Admitting: Neurosurgery

## 2023-03-17 ENCOUNTER — Ambulatory Visit (INDEPENDENT_AMBULATORY_CARE_PROVIDER_SITE_OTHER): Payer: 59 | Admitting: Neurosurgery

## 2023-03-17 ENCOUNTER — Other Ambulatory Visit: Payer: 59 | Admitting: Obstetrics and Gynecology

## 2023-03-17 VITALS — BP 122/80 | Ht 64.0 in | Wt 181.0 lb

## 2023-03-17 DIAGNOSIS — Z981 Arthrodesis status: Secondary | ICD-10-CM

## 2023-03-17 DIAGNOSIS — G894 Chronic pain syndrome: Secondary | ICD-10-CM | POA: Diagnosis not present

## 2023-03-17 DIAGNOSIS — M5134 Other intervertebral disc degeneration, thoracic region: Secondary | ICD-10-CM

## 2023-03-17 DIAGNOSIS — M961 Postlaminectomy syndrome, not elsewhere classified: Secondary | ICD-10-CM | POA: Diagnosis not present

## 2023-03-17 NOTE — Patient Instructions (Signed)
Hi Ms. Siragusa hope your appointment goes well today and your shoulder starts to feel better.  Ms. Jackley was given information about Medicaid Managed Care team care coordination services as a part of their [place] benefit. [name] verbally consented to engagement with the [facility] Managed Care team.   If you are experiencing a medical emergency, please call 911 or report to your local emergency department or urgent care.   If you have a non-emergency medical problem during routine business hours, please contact your provider's office and ask to speak with a nurse.   For questions related to your [place] health plan, please call: [phone]  If you would like to schedule transportation through your [place] plan, please call the following number at least 2 days in advance of your appointment: [phone].   There is no limit to the number of trips during the year between medical appointments, healthcare facilities, or pharmacies. Transportation must be scheduled at least 2 business days before but not more than thirty 30 days before of your appointment.  Call the Behavioral Health Crisis Line at [phone], at any time, 24 hours a day, 7 days a week. If you are in danger or need immediate medical attention call 911.  If you would like help to quit smoking, call 1-800-QUIT-NOW ((435)119-5059) OR Espaol: 1-855-Djelo-Ya (2-355-732-2025) o para ms informacin haga clic aqu or Text READY to 427-062 to register via text  [name] - following are the goals we discussed in your visit today:  Timeframe:  Long-Range Goal Priority:  High Start Date:       12/20/20                      Expected End Date:   ongoing     Follow up date:  04/15/23  - schedule appointment for flu shot - schedule appointment for vaccines needed due to my age or health - schedule recommended health tests (blood work, mammogram, colonoscopy, pap  test) - schedule and keep appointment for annual check-up   Why is this important?   Screening tests can find diseases early when they are easier to treat.  Your doctor or nurse will talk with you about which tests are important for you.  Getting shots for common diseases like the flu and shingles will help prevent them.   03/17/23:  Neurosurgeon appt today, pain management tomorrow.  NEURO in October and CARDS in November.  To reschedule GI appt  Patient verbalizes understanding of instructions and care plan provided today and agrees to view in MyChart. Active MyChart status and patient understanding of how to access instructions and care plan via MyChart confirmed with patient.     The Managed Medicaid care management team will reach out to the patient again over the next 30 business  days.  The  Patient  has been provided with contact information for the Managed Medicaid care management team and has been advised to call with any health related questions or concerns.   Kathi Der RN, BSN [place]  [facility] Care Management Coordinator - Managed Medicaid High Risk [phone]   Following is a copy of your plan of care:  Care Plan : RN Care Manager Plan of Care  Updates made by [name], RN since 03/17/2023 12:00 AM     Problem: Health Promotion or Disease Self-Management (General Plan of Care)      Long-Range Goal: Chronic Disease Management  Start Date: 09/12/2022  Expected End Date: 05/06/2023  Priority: High  Note:   Current Barriers:  Knowledge Deficits related to plan of care for management of GERD, CAD, HTN, PVD, TIA, migranes, COPD, OSA, tobacco use, osteoarthritis, chronic pain, anxiety  Chronic Disease Management support and education needs related to  GERD, CAD, HTN, PVD, TIA, migranes, COPD, OSA, tobacco use, osteoarthritis, chronic pain, anxiety  03/17/23: Patient recovering from right shoulder arthroscopy 9/17. Has all f/u appts scheduled.  To  reschedule GI appt.  No change in smoking.  Seeing neurosurgery today for possible spine stimulator.  BP elevated recently-to monitor.  RNCM Clinical Goal(s):  Patient will verbalize understanding of plan for management of GERD, CAD, HTN, PVD, TIA, migranes, COPD, OSA, tobacco use, osteoarthritis, chronic pain, anxiety   as evidenced by patient report verbalize basic understanding of  GERD, CAD, HTN, PVD, TIA, migranes, COPD, OSA, tobacco use, osteoarthritis, chronic pain, anxiety  disease process and self health management plan as evidenced by patient report take all medications exactly as prescribed and will call provider for medication related questions as evidenced by patient report  demonstrate understanding of rationale for each prescribed medication as evidenced by patient report attend all scheduled medical appointments as evidenced by patient report and EMR review demonstrate Ongoing adherence to prescribed treatment plan for  GERD, CAD, HTN, PVD, TIA, migranes, COPD, OSA, tobacco use, osteoarthritis, chronic pain, anxiety as evidenced by patient report continue to work with RN Care Manager to address care management and care coordination needs related to GERD, CAD, HTN, PVD, TIA, migranes, COPD, OSA, tobacco use, osteoarthritis, chronic pain, anxiety  as evidenced by adherence to CM Team Scheduled appointments through collaboration with RN Care manager, provider, and care team.   Interventions: Inter-disciplinary care team collaboration (see longitudinal plan of care) Evaluation of current treatment plan related to  self management and patient's adherence to plan as established by provider 01/03/23-discussed protein drink supplementation, MVI  CAD Interventions: (Status:  New goal.) Long Term Goal Assessed understanding of CAD diagnosis Medications reviewed including medications utilized in CAD treatment plan Counseled on importance of regular laboratory monitoring as prescribed Reviewed  Importance of taking all medications as prescribed Reviewed Importance of attending all scheduled provider appointments Advised to report any changes in symptoms or exercise tolerance Screening for signs and symptoms of depression related to chronic disease state Assessed social determinant of health barriers  COPD Interventions:  (Status:  New goal.) Long Term Goal Advised patient to self assesses COPD action plan zone and make appointment with provider if in the yellow zone for 48 hours without improvement Advised patient to engage in light exercise as tolerated 3-5 days a week to aid in the the management of COPD Provided education about and advised patient to utilize infection prevention strategies to reduce risk of respiratory infection Discussed the importance of adequate rest and management of fatigue with COPD Assessed social determinant of health barriers  Hypertension Interventions:  (Status:  New goal.) Long Term Goal Last practice recorded BP readings:  BP Readings from Last 3 Encounters:        01/06/23   118/66  03/17/23         124/117 12/02/22         159/78  Most recent eGFR/CrCl:  Lab Results  Component Value Date   EGFR 83 04/08/2022    No components found for: "CRCL"  Evaluation of current treatment plan related to hypertension self management and patient's adherence to plan as established by  provider Reviewed medications with patient and discussed importance of compliance Discussed plans with patient for ongoing care management follow up and provided patient with direct contact information for care management team Advised patient, providing education and rationale, to monitor blood pressure daily and record, calling PCP for findings outside established parameters Reviewed scheduled/upcoming provider appointments including:  Discussed complications of poorly controlled blood pressure such as heart disease, stroke, circulatory complications, vision complications,  kidney impairment, sexual dysfunction Assessed social determinant of health barriers  Pain Interventions:  (Status:  New goal.) Long Term Goal Pain assessment performed Medications reviewed Reviewed provider established plan for pain management Discussed importance of adherence to all scheduled medical appointments Counseled on the importance of reporting any/all new or changed pain symptoms or management strategies to pain management provider Advised patient to report to care team affect of pain on daily activities Reviewed with patient prescribed pharmacological and nonpharmacological pain relief strategies Assessed social determinant of health barriers  Patient Goals/Self-Care Activities: Take all medications as prescribed Attend all scheduled provider appointments Call pharmacy for medication refills 3-7 days in advance of running out of medications Perform all self care activities independently  Perform IADL's (shopping, preparing meals, housekeeping, managing finances) independently Call provider office for new concerns or questions  03/17/23:  To reschedule GI appt  Follow Up Plan:  The patient has been provided with contact information for the care management team and has been advised to call with any health related questions or concerns.  The care management team will reach out to the patient again over the next 45 business  days.

## 2023-03-17 NOTE — Patient Instructions (Signed)
Please see below for information in regards to your upcoming surgery:  [facility] radiology scheduling will contact you to schedule your MRI. If you need to reschedule it, their # is [phone].  Planned surgery: thoracic laminectomy for spinal cord stimulator placement [facility])   Surgery date: (to be determined after clearance from Dr Joice Lofts) at [facility]: [address] [place], [place] [address]) - you will find out your arrival time the business day before your surgery.   Pre-op appointment at [facility] Pre-admit Testing: we will call you with a date/time for this. If you are scheduled for an in person appointment, Pre-admit Testing is located on the first floor of the [facility], [address], [address]. Please bring all prescriptions in the original prescription bottles to your appointment. During this appointment, they will advise you which medications you can take the morning of surgery, and which medications you will need to hold for surgery. Labs (such as blood work, EKG) may be done at your pre-op appointment. You are not required to fast for these labs. Should you need to change your pre-op appointment, please call Pre-admit testing at [phone].     Blood thinners:   Aspirin:   ok to stay on aspirin 81mg     Common restrictions after surgery: No bending, lifting, or twisting ("BLT"). Avoid lifting objects heavier than 10 pounds for the first 6 weeks after surgery. Where possible, avoid household activities that involve lifting, bending, reaching, pushing, or pulling such as laundry, vacuuming, grocery shopping, and childcare. Try to arrange for help from friends and family for these activities while you heal. Do not drive while taking prescription pain medication. Weeks 6 through 12 after surgery: avoid lifting more than 25 pounds.     How to contact us:  If you have any questions/concerns before or  after surgery, you can reach [place] at [phone], or you can send a mychart message. We can be reached by phone or mychart 8am-4pm, Monday-Friday.  *Please note: Calls after 4pm are forwarded to a third party answering service. Mychart messages are not routinely monitored during evenings, weekends, and holidays. Please call our office to contact the answering service for urgent concerns during non-business hours.    If you have FMLA/disability paperwork, please drop it off or fax it to [phone], attention Patty.   Appointments/FMLA & disability paperwork: Joycelyn Rua, & Flonnie Hailstone Nurse: Royston Cowper  Medical assistants: Laurann Montana, & Lyla Son Physician Assistants: Manning Charity & Drake Leach Surgeons: Venetia Night, MD & Ernestine Mcmurray, MD

## 2023-03-17 NOTE — Patient Outreach (Signed)
Medicaid Managed Care   Nurse Care Manager Note  03/17/2023 Name:  Sara Munoz MRN:  [id number] DOB:  09/29/1965  Sara Munoz is an 57 y.o. year old female who is a primary patient of [name], Tawanna Cooler, MD.  The Medicaid Managed Care Coordination team was consulted for assistance with:    Chronic healthcare management needs, GERD, CAD, HTN, tobacco use, chronic pain, COPD, OSA, anxiety, osteoarthritis, HLD  Sara Munoz was given information about Medicaid Managed Care Coordination team services today. Sara Munoz Patient agreed to services and verbal consent obtained.  Engaged with patient by telephone for follow up visit in response to provider referral for case management and/or care coordination services.   Assessments/Interventions:  Review of past medical history, allergies, medications, health status, including review of consultants reports, laboratory and other test data, was performed as part of comprehensive evaluation and provision of chronic care management services.  SDOH (Social Determinants of Health) assessments and interventions performed: SDOH Interventions    Flowsheet Row Patient Outreach Telephone from 03/17/2023 in [place] POPULATION HEALTH DEPARTMENT Patient Outreach Telephone from 02/03/2023 in [place] POPULATION HEALTH DEPARTMENT Patient Outreach Telephone from 01/01/2023 in [place] POPULATION HEALTH DEPARTMENT Patient Outreach Telephone from 12/02/2022 in [place] POPULATION HEALTH DEPARTMENT Patient Outreach Telephone from 10/23/2022 in [place] POPULATION HEALTH DEPARTMENT Patient Outreach Telephone from 09/12/2022 in  POPULATION HEALTH DEPARTMENT  SDOH Interventions        Food Insecurity Interventions -- -- -- -- Intervention Not Indicated --  Housing Interventions -- Intervention Not Indicated -- -- -- --  Transportation Interventions -- -- Intervention Not Indicated -- -- --  Utilities Interventions -- -- -- Intervention Not  Indicated -- --  Alcohol Usage Interventions -- -- -- Intervention Not Indicated (Score <7) -- --  Financial Strain Interventions -- -- Intervention Not Indicated -- -- --  Physical Activity Interventions -- -- -- -- -- Intervention Not Indicated  Stress Interventions Provide Counseling, Other (Comment)  [sees therapist as scheduled] -- -- -- -- --  Health Literacy Interventions -- Intervention Not Indicated -- -- -- --       Care Plan  Allergies  Allergen Reactions   Cyclobenzaprine Hives, Swelling and Rash    Facial/lip swelling      Levofloxacin Hives, Swelling and Rash   Chlorpheniramine Maleate     Other reaction(s): Unknown   Phenergan [Promethazine Hcl] Other (See Comments)    Agitation.    Phenylephrine Hcl Other (See Comments)   Toradol [Ketorolac Tromethamine] Swelling and Other (See Comments)    Facial/tongue swelling    Zoloft [Sertraline Hcl] Swelling    Tongue swelling      Cephalexin Hives and Rash   Ketorolac Rash   Tramadol Hives, Swelling, Other (See Comments) and Rash    Lip swelling     Medications Reviewed Today     Reviewed by [name], RN (Registered Nurse) on 03/17/23 at 1327  Med List Status: <None>   Medication Order Taking? Sig Documenting Provider Last Dose Status Informant  albuterol (PROVENTIL) (2.5 MG/3ML) 0.083% nebulizer solution [id number] No Take 3 mLs (2.5 mg total) by nebulization every 6 (six) hours as needed for wheezing or shortness of breath. [name], [name], MD Taking Active Self  albuterol (VENTOLIN HFA) 108 (90 Base) MCG/ACT inhaler [id number] No Inhale 1-2 puffs into the lungs every 6 (six) hours as needed. INHALE TWO PUFFS BY MOUTH INTO LUNGS EVERY 6 HOURS AS NEEDED FOR SHORTNESS OF BREATH OR wheezing  Ronnald Ramp, MD 03/11/2023 0700 Active   amLODipine (NORVASC) 5 MG tablet [id number] No TAKE 1 TABLET BY MOUTH EVERYDAY AT BEDTIME [name], Tawanna Cooler, MD 03/10/2023 Active   aspirin EC 81 MG tablet  [id number] No Take 81 mg by mouth daily. Swallow whole. [provider] 03/06/2023 Active Self  atorvastatin (LIPITOR) 40 MG tablet [id number] No Take 1 tablet (40 mg total) by mouth every evening. Debbe Odea, MD 03/10/2023 Active Self  docusate sodium (COLACE) 100 MG capsule [id number] No Take 100 mg by mouth 2 (two) times daily. [provider] 03/07/2023 Active Self  ELDERBERRY PO [id number] No Take 1 tablet by mouth daily at 6 (six) AM. [provider] 03/06/2023 Active   gabapentin (NEURONTIN) 600 MG tablet [id number] No Take 600 mg by mouth 4 (four) times daily. [provider] 03/11/2023 0700 Active Self  lactulose (CHRONULAC) 10 GM/15ML solution [id number] No Take 30 mLs by mouth 2 (two) times daily. [provider] Past Week Active Self  LINZESS 290 MCG CAPS capsule [id number] No Take 290 mcg by mouth daily as needed (constipation). [provider] Past Month Active Self  lisinopril (ZESTRIL) 40 MG tablet [id number] No Take 40 mg by mouth daily. [provider] 03/10/2023 Active   metoprolol tartrate (LOPRESSOR) 25 MG tablet [id number] No TAKE 1 TABLET BY MOUTH TWICE A DAY [name], [name], MD 03/11/2023 0700 Active   mometasone (ELOCON) 0.1 % cream [id number] No Apply 1 Application topically daily as needed (hand rash). Poggi, Excell Seltzer, MD Taking Active   mometasone-formoterol [employer]) 100-5 MCG/ACT Sandrea Matte [id number] No Inhale 2 puffs into the lungs every morning. Christena Flake, MD 03/11/2023 0700 Active   Multiple Vitamins-Minerals (HAIR SKIN & NAILS PO) [id number] No Take 1 tablet by mouth daily at 6 (six) AM. [provider] Past Week Active   naloxone (NARCAN) 4 MG/0.1ML LIQD nasal spray kit [id number] No Place 1 spray into the nose as needed (opioid overdose). [provider] Taking Active Self           Med Note Richardean Canal May 03, 2021 11:23 AM)    Omega-3 Fatty Acids (OMEGA 3 PO) [id number] No Take 1  capsule by mouth daily at 6 (six) AM. [provider] Past Week Active   omeprazole (PRILOSEC) 40 MG capsule [id number] No TAKE 1 CAPSULE BY MOUTH TWICE A Edwyna Ready, MD 03/11/2023 0700 Active   ondansetron (ZOFRAN) 8 MG tablet [id number] No Take 1 tablet (8 mg total) by mouth every 8 (eight) hours as needed for nausea or vomiting. [name], Tawanna Cooler, MD 03/11/2023 0700 Active   oxyCODONE (ROXICODONE) 15 MG immediate release tablet [id number] No Take 15 mg by mouth every 4 (four) hours as needed for pain. [provider] 03/11/2023 2353 Active Self  oxyCODONE (ROXICODONE) 5 MG immediate release tablet [id number]  Take 1-2 tablets (5-10 mg total) by mouth every 4 (four) hours as needed for moderate pain, severe pain or breakthrough pain. Poggi, Excell Seltzer, MD  Active   sucralfate (CARAFATE) 1 GM/10ML suspension [id number] No Take 10 mLs (1 g total) by mouth 4 (four) times daily. [name], MD 03/10/2023 Active   Tiotropium Bromide Monohydrate (SPIRIVA RESPIMAT) 2.5 MCG/ACT AERS [id number] No Take 1 puff by mouth in the morning and at bedtime. Jacky Kindle, FNP 03/10/2023 Active   TURMERIC PO [id number] No Take 1 tablet by mouth daily at 6 (six) AM. [provider] Past Week Active   zolpidem (AMBIEN) 5 MG tablet [id number]  No Take 1 tablet (5 mg total) by mouth at bedtime as needed for sleep. Ronnald Ramp, MD 03/07/2023 Active             Patient Active Problem List   Diagnosis Date Noted   Chronic fatigue 01/06/2023   Prediabetes 01/06/2023   Atopic dermatitis of both hands 09/12/2022   Headache disorder 08/13/2022   Encounter for annual physical examination excluding gynecological examination in a patient older than 17 years 04/03/2022   Essential hypertension 03/11/2022   Lumbar spondylosis 02/12/2022   Chronic migraine w/o aura w/o status migrainosus, not intractable 07/09/2021   Obstructive sleep apnea 07/09/2021   Body mass index (BMI) 32.0-32.9,  adult 06/07/2021   Pseudoarthrosis of cervical spine (HCC) 05/14/2021   Lumbar radiculopathy 05/23/2020   Sacroiliac inflammation (HCC) 11/25/2019   Sepsis (HCC) 10/19/2018   Severe sepsis (HCC) 10/19/2018   Cervical radiculopathy 02/12/2018   Chronic migraine 02/12/2018   Paresthesia 08/29/2017   Neck pain 08/29/2017   Daily headache 08/29/2017   Chronic active hepatitis (HCC) 07/29/2017   Neurogenic pain 07/31/2016   Chronic pain syndrome 06/05/2016   Carotid artery stenosis 02/27/2016   Chronic constipation 02/27/2016   Chronic nausea 02/27/2016   Hypercholesterolemia 02/27/2016   Osteoarthritis 02/27/2016   PTSD (post-traumatic stress disorder) 02/27/2016   TIA (transient ischemic attack) 02/27/2016   Long term current use of opiate analgesic 02/27/2016   Long term prescription opiate use 02/27/2016   Encounter for pain management consult 02/27/2016   Chronic hip pain (Location of Tertiary source of pain) (Bilateral) (L>R) 02/27/2016   Chronic knee pain (Bilateral) (L>R) 02/27/2016   Chronic shoulder pain (Bilateral) (L>R) 02/27/2016   Chronic sacroiliac joint pain (Bilateral) (L>R) 02/27/2016   Chronic low back pain (Location of Primary Source of Pain) (Bilateral) (L>R) 02/27/2016   Chronic lower extremity pain (Location of Secondary source of pain) (Bilateral) (L>R) 02/27/2016   Osteoarthritis of hip (Bilateral) (L>R) 02/27/2016   Chronic neck pain (posterior midline) (Bilateral) (L>R) 02/27/2016   Cervicogenic headache (Bilateral) (L>R) 02/27/2016   Occipital headache (Bilateral) (L>R) 02/27/2016   Chronic upper extremity pain (Bilateral) (L>R) 02/27/2016   Chronic Cervical radicular pain (Bilateral) (L>R) 02/27/2016   Chronic lumbar radicular pain (Right) (L5 dermatome) 02/27/2016   Lumbar facet syndrome (Bilateral) (L>R) 02/27/2016   Long term prescription benzodiazepine use 02/27/2016   Hypertension    Chronic obstructive pulmonary disease (HCC) 10/09/2015   GERD  (gastroesophageal reflux disease) 10/09/2015   Non-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/2014 08/02/2015   Hyperlipidemia 08/02/2015   Costochondritis 08/02/2015   Drug abuse, 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   GAD (generalized anxiety disorder) 09/03/2014   Narcotic dependence (HCC) 09/03/2014   PVD (peripheral vascular disease) (HCC) 09/03/2014   Smoker 09/03/2014   Cigarette nicotine dependence with nicotine-induced disorder 07/08/2014   Anemia of chronic disease 03/19/2014   Narcotic abuse, continuous (HCC) 03/19/2014   Polysubstance abuse (HCC) 03/19/2014   MSSA (methicillin susceptible Staphylococcus aureus) septicemia (HCC) 03/07/2014   Hypomagnesemia 01/04/2014   Chronic cough 09/04/2011   Sleep apnea, obstructive 09/04/2011   Sinusitis, acute 09/04/2011   Conditions to be addressed/monitored per PCP order:  Chronic healthcare management needs, GERD, CAD, HTN, tobacco use, chronic pain, COPD, OSA, anxiety, osteoarthritis, HLD  Care Plan : RN Care Manager Plan of Care  Updates made by [name], RN since 03/17/2023 12:00 AM     Problem: Health Promotion or Disease Self-Management (General Plan of Care)      Long-Range Goal: Chronic Disease Management  Start Date: 09/12/2022  Expected End Date: 05/06/2023  Priority: High  Note:   Current Barriers:  Knowledge Deficits related to plan of care for management of GERD, CAD, HTN, PVD, TIA, migranes, COPD, OSA, tobacco use, osteoarthritis, chronic pain, anxiety  Chronic Disease Management support and education needs related to  GERD, CAD, HTN, PVD, TIA, migranes, COPD, OSA, tobacco use, osteoarthritis, chronic pain, anxiety  03/17/23: Patient recovering from right shoulder arthroscopy 9/17. Has all f/u appts scheduled.  To reschedule GI appt.  No change in smoking.  Seeing neurosurgery today for possible spine stimulator.  BP elevated recently-to monitor.  RNCM Clinical Goal(s):  Patient will verbalize understanding of plan for management of  GERD, CAD, HTN, PVD, TIA, migranes, COPD, OSA, tobacco use, osteoarthritis, chronic pain, anxiety   as evidenced by patient report verbalize basic understanding of  GERD, CAD, HTN, PVD, TIA, migranes, COPD, OSA, tobacco use, osteoarthritis, chronic pain, anxiety  disease process and self health management plan as evidenced by patient report take all medications exactly as prescribed and will call provider for medication related questions as evidenced by patient report  demonstrate understanding of rationale for each prescribed medication as evidenced by patient report attend all scheduled medical appointments as evidenced by patient report and EMR review demonstrate Ongoing adherence to prescribed treatment plan for  GERD, CAD, HTN, PVD, TIA, migranes, COPD, OSA, tobacco use, osteoarthritis, chronic pain, anxiety as evidenced by patient report continue to work with RN Care Manager to address care management and care coordination needs related to GERD, CAD, HTN, PVD, TIA, migranes, COPD, OSA, tobacco use, osteoarthritis, chronic pain, anxiety  as evidenced by adherence to CM Team Scheduled appointments through collaboration with RN Care manager, provider, and care team.   Interventions: Inter-disciplinary care team collaboration (see longitudinal plan of care) Evaluation of current treatment plan related to  self management and patient's adherence to plan as established by provider 01/03/23-discussed protein drink supplementation, MVI  CAD Interventions: (Status:  New goal.) Long Term Goal Assessed understanding of CAD diagnosis Medications reviewed including medications utilized in CAD treatment plan Counseled on importance of regular laboratory monitoring as prescribed Reviewed Importance of taking all medications as prescribed Reviewed Importance of attending all scheduled provider appointments Advised to report any changes in symptoms or exercise tolerance Screening for signs and symptoms of  depression related to chronic disease state Assessed social determinant of health barriers  COPD Interventions:  (Status:  New goal.) Long Term Goal Advised patient to self assesses COPD action plan zone and make appointment with provider if in the yellow zone for 48 hours without improvement Advised patient to engage in light exercise as tolerated 3-5 days a week to aid in the the management of COPD Provided education about and advised patient to utilize infection prevention strategies to reduce risk of respiratory infection Discussed the importance of adequate rest and management of fatigue with COPD Assessed social determinant of health barriers  Hypertension Interventions:  (Status:  New goal.) Long Term Goal Last practice recorded BP readings:  BP Readings from Last 3 Encounters:        01/06/23   118/66  03/17/23         124/117 12/02/22         159/78  Most recent eGFR/CrCl:  Lab Results  Component Value Date   EGFR 83 04/08/2022    No components found for: "CRCL"  Evaluation of current treatment plan related to hypertension self management and patient's adherence to plan as established by  provider Reviewed medications with patient and discussed importance of compliance Discussed plans with patient for ongoing care management follow up and provided patient with direct contact information for care management team Advised patient, providing education and rationale, to monitor blood pressure daily and record, calling PCP for findings outside established parameters Reviewed scheduled/upcoming provider appointments including:  Discussed complications of poorly controlled blood pressure such as heart disease, stroke, circulatory complications, vision complications, kidney impairment, sexual dysfunction Assessed social determinant of health barriers  Pain Interventions:  (Status:  New goal.) Long Term Goal Pain assessment performed Medications reviewed Reviewed provider  established plan for pain management Discussed importance of adherence to all scheduled medical appointments Counseled on the importance of reporting any/all new or changed pain symptoms or management strategies to pain management provider Advised patient to report to care team affect of pain on daily activities Reviewed with patient prescribed pharmacological and nonpharmacological pain relief strategies Assessed social determinant of health barriers  Patient Goals/Self-Care Activities: Take all medications as prescribed Attend all scheduled provider appointments Call pharmacy for medication refills 3-7 days in advance of running out of medications Perform all self care activities independently  Perform IADL's (shopping, preparing meals, housekeeping, managing finances) independently Call provider office for new concerns or questions  03/17/23:  To reschedule GI appt  Follow Up Plan:  The patient has been provided with contact information for the care management team and has been advised to call with any health related questions or concerns.  The care management team will reach out to the patient again over the next 45 business  days.   Follow Up:  Patient agrees to Care Plan and Follow-up.  Plan: The Managed Medicaid care management team will reach out to the patient again over the next 30 business  days. and The  Patient has been provided with contact information for the Managed Medicaid care management team and has been advised to call with any health related questions or concerns.  Date/time of next scheduled RN care management/care coordination outreach:  04/15/23 at 1230

## 2023-03-18 DIAGNOSIS — M961 Postlaminectomy syndrome, not elsewhere classified: Secondary | ICD-10-CM | POA: Insufficient documentation

## 2023-03-18 DIAGNOSIS — Z981 Arthrodesis status: Secondary | ICD-10-CM | POA: Insufficient documentation

## 2023-03-19 ENCOUNTER — Telehealth: Payer: Self-pay

## 2023-03-19 NOTE — Telephone Encounter (Signed)
-----   Message from Lovenia Kim sent at 03/19/2023  1:35 PM EDT ----- Regarding: SCS Patient  ----- Message ----- From: Christena Flake, MD Sent: 03/19/2023   8:08 AM EDT To: Lovenia Kim, MD  Gaylyn Cheers,  I think anytime after another 3 to 4 weeks would be certainly safe for you to perform this procedure on her.  We did not do any formal rotator cuff repair, but did perform a biceps tenodesis.  Once that she is a month or so out, then you should be able to position her anyway you would like safely.  Thanks for checking with me!  Have a great day! ----- Message ----- From: Lovenia Kim, MD Sent: 03/18/2023   3:52 PM EDT To: Christena Flake, MD  Hi Dr. Joice Lofts,  [facility] you are doing well.  Saw a mutual patient yesterday.   I was asked to placed a permanent spinal cord stimulator and for them.  I just wanted to know from your standpoint given the recent rotator cuff surgery on when you would be okay with me doing this.    Thank so much, Apolinar Junes

## 2023-03-21 ENCOUNTER — Telehealth: Payer: Self-pay | Admitting: Neurosurgery

## 2023-03-21 MED ORDER — DIAZEPAM 5 MG PO TABS: ORAL_TABLET | ORAL | 0 refills | Status: DC

## 2023-03-21 NOTE — Telephone Encounter (Signed)
Valium sent to pharmacy. Please remind her that she will need a driver.

## 2023-03-21 NOTE — Telephone Encounter (Signed)
Patient made her appointment to complete her MRI exam- needing claustrophobia medication sent in to  [id number] - [place], [place] - [address]  Please advise

## 2023-03-25 ENCOUNTER — Ambulatory Visit
Admission: RE | Admit: 2023-03-25 | Discharge: 2023-03-25 | Disposition: A | Discharge status: Home / Self Care | Payer: 59 | Source: Ambulatory Visit | Attending: Neurosurgery | Admitting: Neurosurgery

## 2023-03-25 DIAGNOSIS — Z981 Arthrodesis status: Secondary | ICD-10-CM | POA: Diagnosis present

## 2023-03-25 DIAGNOSIS — M5134 Other intervertebral disc degeneration, thoracic region: Secondary | ICD-10-CM | POA: Insufficient documentation

## 2023-03-25 DIAGNOSIS — M961 Postlaminectomy syndrome, not elsewhere classified: Secondary | ICD-10-CM | POA: Diagnosis present

## 2023-03-26 ENCOUNTER — Other Ambulatory Visit: Payer: Self-pay

## 2023-03-26 MED ORDER — SUCRALFATE 1 GM/10ML PO SUSP: 1.0000 g | Freq: Four times a day (QID) | ORAL | 0 refills | Status: DC

## 2023-03-31 ENCOUNTER — Other Ambulatory Visit: Payer: Self-pay

## 2023-03-31 DIAGNOSIS — Z01818 Encounter for other preprocedural examination: Secondary | ICD-10-CM

## 2023-03-31 NOTE — Telephone Encounter (Signed)
I spoke with Sara Munoz. She would like to wait until after her appointment with Dr Joice Lofts, which is 04/25/23. We have scheduled surgery for 05/01/23.

## 2023-04-01 NOTE — Progress Notes (Deleted)
NEUROLOGY CONSULTATION NOTE  Sara Munoz MRN: [id number] DOB: [birth year]  Referring provider: Ronnald Ramp, MD Primary care provider: Ronnald Ramp, MD  Reason for consult:  headache  Assessment/Plan:   ***   Subjective:  Sara Munoz is a 57 year old ***-handed female with CAD, COPD, carotid artery disease, aortic atherosclerosis, asthma, anxiety, chronic back pain s/p surgery, s/p cervical ACDF, PTSD and history of TIA who presents for headaches.  History supplemented by referring provider's note.  Onset:  *** Location:  *** Quality:  *** Intensity:  ***.  *** denies new headache, thunderclap headache or severe headache that wakes *** from sleep. Aura:  *** Prodrome:  *** Postdrome:  *** Associated symptoms:  ***.  *** denies associated unilateral numbness or weakness. Duration:  *** Frequency:  *** Frequency of abortive medication: *** Triggers:  *** Relieving factors:  *** Activity:  ***  MRI of brain without contrast on 09/11/2017 personally reviewed was unremarkable.  For neck pain, MRI of cervical spine was performed 09/11/2017 which revealed central disc protrusion at C6-7 with mild to moderate canal stenosis and cord flattening without signal abnormality.  Underwent ACDF C5-C7.  Neck pain progressed from there.  Repeat MRI of C-spine on 04/26/2021 revealed ACDF C5-C7 with progressed moderate bilateral neural foraminal narrowing at C6-C7  and mild spinal canal stenosis and mild bilateral neural foraminal narrowing at C5-6.  Underwent C5-6, C6-7 and C7-T1 posterior instrumented fusion and laminectomy in November 2022.    Past NSAIDS/analgesics:  naproxen Past abortive triptans:  rizatriptan, sumatriptan tab Past abortive ergotamine:  *** Past muscle relaxants:  Robaxin, tizanidine, Flexeril Past anti-emetic:  *** Past antihypertensive medications:  atenolol Past antidepressant medications:  venlafaxine, Wellbutrin Past anticonvulsant  medications:  *** Past anti-CGRP:  Aimovig, Ubrelvy Past vitamins/Herbal/Supplements:  magnesium oxide Past antihistamines/decongestants:  *** Other past therapies:  ***  Current NSAIDS/analgesics:  ASA 81mg  daily, oxycodone (chronic pain), diclofenac 50mg  BID Current triptans:  none Current ergotamine:  none Current anti-emetic:  Zofran 8mg  Current muscle relaxants:  none Current Antihypertensive medications:  metoprolol tartrate, lisinopril, amlodipine Current Antidepressant medications:  none Current Anticonvulsant medications:  gabapentin 600mg  four times daily Current anti-CGRP:  none Other therapy:  ***   Caffeine:  *** Alcohol:  *** Smoker:  *** Diet:  *** Exercise:  *** Depression:  ***; Anxiety:  *** Other pain:  *** Sleep hygiene:  *** Family history of headache:  ***      PAST MEDICAL HISTORY: Past Medical History:  Diagnosis Date   Anemia    Anxiety    Aortic atherosclerosis (HCC)    Asthma    Atypical chest pain 08/02/2015   Carotid artery disease (HCC)    Chronic back pain    Chronic nausea    Chronic, continuous use of opioids 02/27/2016   a.) has naloxone Rx available   COPD (chronic obstructive pulmonary disease) (HCC)    Coronary artery disease    a.) LHC 2011: no sig CAD (report unavailable); b.) LHC 07/07/2014: 10-20% mLM, 25% LAD, 30-40% LCx, 20-30% RCA -- med mgmt; c.) LHC 02/22/2016: 25% LM, 25% pLCx --> med mgmt; d.) MPI 07/10/2017: no ischemia; e.) MPI 09/01/2019: no ischemia; f.) cCTA 10/28/2022: Ca score 1092 (99th percentile for age/sex/race match control)   Current smoker    DDD (degenerative disc disease), cervical    a.) s/p ACDF C5-C7 and PSIF C5-T1   DDD (degenerative disc disease), lumbar 05/23/2020   a.) s/p L5-S1 PLIF   GERD (gastroesophageal reflux disease)  Hepatic steatosis    Hepatitis C virus infection cured after antiviral drug therapy    a.) s/p Tx with standard glecaprevir-pibrentasvir course   Hypercholesteremia     Hyperkalemia    Hyperlipidemia    Hypertension    Hypokalemia    Hypomagnesemia 01/04/2014   Incomplete tear of left rotator cuff    MI (myocardial infarction) (HCC)    Migraines    MSSA (methicillin susceptible Staphylococcus aureus) septicemia (HCC) 03/03/2014   a.) TEE (-) for valvular vegitation   Nerve pain    per patient, in the lower back   Osteoarthritis    Pneumonia    PTSD (post-traumatic stress disorder)    PVD (peripheral vascular disease) (HCC)    Tendinitis of left rotator cuff    TIA (transient ischemic attack)     PAST SURGICAL HISTORY: Past Surgical History:  Procedure Laterality Date   ANTERIOR CERVICAL DECOMP/DISCECTOMY FUSION N/A 03/30/2018   Procedure: Cervical six-seven Anterior cervical decompression/discectomy/fusion;  Surgeon: Jadene Pierini, MD;  [facility];  Service: Neurosurgery;  Laterality: N/A;   ANTERIOR CERVICAL DECOMP/DISCECTOMY FUSION N/A 01/11/2019   Procedure: Cervical Five-Six Anterior cervical discectomy fusion,  Cervical Five to Cervical Seven anterior instrumented fusion;  Surgeon: Jadene Pierini, MD;  [facility];  Service: Neurosurgery;  Laterality: N/A;  Cervical Five-Six Anterior cervical discectomy fusion,  Cervical Five to Cervical Seven anterior instrumented fusion   CARDIAC CATHETERIZATION Left 02/22/2016   Procedure: Left Heart Cath and Coronary Angiography;  Surgeon: Alwyn Pea, MD;  Location: [facility] INVASIVE CV LAB;  Service: Cardiovascular;  Laterality: Left;   CATARACT EXTRACTION W/ INTRAOCULAR LENS IMPLANT Right    COLONOSCOPY WITH PROPOFOL N/A 09/19/2016   Procedure: COLONOSCOPY WITH PROPOFOL;  Surgeon: [name], MD;  Location: [facility] ENDOSCOPY;  Service: Endoscopy;  Laterality: N/A;   DIAGNOSTIC LAPAROSCOPY     ESOPHAGOGASTRODUODENOSCOPY (EGD) WITH PROPOFOL N/A 09/18/2017   Procedure: ESOPHAGOGASTRODUODENOSCOPY (EGD) WITH PROPOFOL;  Surgeon: [name], MD;  [facility] ENDOSCOPY;  Service:  Gastroenterology;  Laterality: N/A;   ESOPHAGOGASTRODUODENOSCOPY (EGD) WITH PROPOFOL N/A 08/10/2019   Procedure: ESOPHAGOGASTRODUODENOSCOPY (EGD) WITH PROPOFOL;  Surgeon: [name], MD;  Location: [facility] ENDOSCOPY;  Service: Endoscopy;  Laterality: N/A;   HEMORRHOID SURGERY     LEFT HEART CATH AND CORONARY ANGIOGRAPHY Left 07/07/2014   LEFT HEART CATH AND CORONARY ANGIOGRAPHY Left 2011   LUMBAR WOUND DEBRIDEMENT N/A 11/05/2022   Procedure: Thoracic one spinous process removal and wound revision;  Surgeon: Jadene Pierini, MD;  [facility];  Service: Neurosurgery;  Laterality: N/A;   NASAL SINUS SURGERY     ORIF FEMUR FRACTURE Left    ORIF TIBIA & FIBULA FRACTURES Right    OVARIAN CYST SURGERY     POSTERIOR CERVICAL FUSION/FORAMINOTOMY N/A 05/14/2021   Procedure: Cervical five, Cervical six Laminectomy,foraminotomy with Cervical five-six Cervical six-seven Cervical seven-Thoracic one Posterior instrumented fusion;  Surgeon: Jadene Pierini, MD;  [facility];  Service: Neurosurgery;  Laterality: N/A;   SHOULDER ARTHROSCOPY WITH SUBACROMIAL DECOMPRESSION, ROTATOR CUFF REPAIR AND BICEP TENDON REPAIR Left 05/28/2022   Procedure: SHOULDER ARTHROSCOPY WITH DEBRIDEMENT, DECOMPRESSION, ROTATOR CUFF REPAIR AND BICEP TENDON REPAIR;  Surgeon: Christena Flake, MD;  Location: [facility] ORS;  Service: Orthopedics;  Laterality: Left;   SHOULDER ARTHROSCOPY WITH SUBACROMIAL DECOMPRESSION, ROTATOR CUFF REPAIR AND BICEP TENDON REPAIR Left 12/03/2022   Procedure: SHOULDER Extensive ARTHROSCOPY WITH DEBRIDEMENT, removal of Retained Foreign Body;  Surgeon: Christena Flake, MD;  Location: [facility] ORS;  Service: Orthopedics;  Laterality: Left;   SHOULDER ARTHROSCOPY WITH SUBACROMIAL DECOMPRESSION, ROTATOR CUFF REPAIR AND BICEP TENDON REPAIR Right 03/11/2023   Procedure: RIGHT SHOULDER ARTHROSCOPY WITH DEBRIDMENT, DECOMPRESSION and BICEPS TENODESIS.;  Surgeon: Christena Flake, MD;  Location: [facility] ORS;  Service:  Orthopedics;  Laterality: Right;   TRANSFORAMINAL LUMBAR INTERBODY FUSION W/ MIS 1 LEVEL Right 05/23/2020   Procedure: Right Lumbar Five Sacral One Minimally invasive transforaminal lumbar interbody fusion;  Surgeon: Jadene Pierini, MD;  [facility];  Service: Neurosurgery;  Laterality: Right;  Right Lumbar Five Sacral One Minimally invasive transforaminal lumbar interbody fusion    MEDICATIONS: Current Outpatient Medications on File Prior to Visit  Medication Sig Dispense Refill   albuterol (PROVENTIL) (2.5 MG/3ML) 0.083% nebulizer solution Take 3 mLs (2.5 mg total) by nebulization every 6 (six) hours as needed for wheezing or shortness of breath. 75 mL 12   albuterol (VENTOLIN HFA) 108 (90 Base) MCG/ACT inhaler Inhale 1-2 puffs into the lungs every 6 (six) hours as needed. INHALE TWO PUFFS BY MOUTH INTO LUNGS EVERY 6 HOURS AS NEEDED FOR SHORTNESS OF BREATH OR wheezing 18 g 3   amLODipine (NORVASC) 5 MG tablet TAKE 1 TABLET BY MOUTH EVERYDAY AT BEDTIME 90 tablet 0   aspirin EC 81 MG tablet Take 81 mg by mouth daily. Swallow whole.     atorvastatin (LIPITOR) 40 MG tablet Take 1 tablet (40 mg total) by mouth every evening. 90 tablet 3   diazepam (VALIUM) 5 MG tablet 1 po 30 minutes prior to MRI scan. May repeat x 1 right before MRI scan if needed. You will need a driver. 2 tablet 0   diclofenac (CATAFLAM) 50 MG tablet Take 50 mg by mouth 2 (two) times daily.     docusate sodium (COLACE) 100 MG capsule Take 100 mg by mouth 2 (two) times daily.     ELDERBERRY PO Take 1 tablet by mouth daily at 6 (six) AM.     gabapentin (NEURONTIN) 600 MG tablet Take 600 mg by mouth 4 (four) times daily.     lactulose (CHRONULAC) 10 GM/15ML solution Take 30 mLs by mouth 2 (two) times daily.     LINZESS 290 MCG CAPS capsule Take 290 mcg by mouth daily as needed (constipation).     lisinopril (ZESTRIL) 40 MG tablet Take 40 mg by mouth daily.     metoprolol tartrate (LOPRESSOR) 25 MG tablet TAKE 1 TABLET BY  MOUTH TWICE A DAY 180 tablet 0   mometasone (ELOCON) 0.1 % cream Apply 1 Application topically daily as needed (hand rash).     mometasone-formoterol (DULERA) 100-5 MCG/ACT AERO Inhale 2 puffs into the lungs every morning.     Multiple Vitamins-Minerals (HAIR SKIN & NAILS PO) Take 1 tablet by mouth daily at 6 (six) AM.     naloxone (NARCAN) 4 MG/0.1ML LIQD nasal spray kit Place 1 spray into the nose as needed (opioid overdose).     Omega-3 Fatty Acids (OMEGA 3 PO) Take 1 capsule by mouth daily at 6 (six) AM.     omeprazole (PRILOSEC) 40 MG capsule TAKE 1 CAPSULE BY MOUTH TWICE A DAY 180 capsule 3   ondansetron (ZOFRAN) 8 MG tablet Take 1 tablet (8 mg total) by mouth every 8 (eight) hours as needed for nausea or vomiting. 90 tablet 1   oxyCODONE (ROXICODONE) 15 MG immediate release tablet Take 15 mg by mouth every 4 (four) hours as needed for pain.     sucralfate (CARAFATE) 1 GM/10ML suspension Take 10  mLs (1 g total) by mouth 4 (four) times daily. 840 mL 0   Tiotropium Bromide Monohydrate (SPIRIVA RESPIMAT) 2.5 MCG/ACT AERS Take 1 puff by mouth in the morning and at bedtime. 12 g 11   TURMERIC PO Take 1 tablet by mouth daily at 6 (six) AM.     zolpidem (AMBIEN) 5 MG tablet Take 1 tablet (5 mg total) by mouth at bedtime as needed for sleep. 30 tablet 0   No current facility-administered medications on file prior to visit.    ALLERGIES: Allergies  Allergen Reactions   Cyclobenzaprine Hives, Swelling and Rash    Facial/lip swelling      Levofloxacin Hives, Swelling and Rash   Chlorpheniramine Maleate     Other reaction(s): Unknown   Phenergan [Promethazine Hcl] Other (See Comments)    Agitation.    Phenylephrine Hcl Other (See Comments)   Toradol [Ketorolac Tromethamine] Swelling and Other (See Comments)    Facial/tongue swelling    Zoloft [Sertraline Hcl] Swelling    Tongue swelling      Cephalexin Hives and Rash   Ketorolac Rash   Tramadol Hives, Swelling, Other (See Comments) and  Rash    Lip swelling     FAMILY HISTORY: Family History  Problem Relation Age of Onset   Heart disease Mother    Lung cancer Mother    Ovarian cancer Mother    Healthy Brother    Healthy Brother    Healthy Brother    Diabetes Maternal Uncle    Breast cancer Maternal Aunt     Objective:  *** General: No acute distress.  Patient appears well-groomed.   Head:  Normocephalic/atraumatic Eyes:  fundi examined but not visualized Neck: supple, no paraspinal tenderness, full range of motion Back: No paraspinal tenderness Heart: regular rate and rhythm Lungs: Clear to auscultation bilaterally. Vascular: No carotid bruits. Neurological Exam: Mental status: alert and oriented to person, place, and time, speech fluent and not dysarthric, language intact. Cranial nerves: CN I: not tested CN II: pupils equal, round and reactive to light, visual fields intact CN III, IV, VI:  full range of motion, no nystagmus, no ptosis CN V: facial sensation intact. CN VII: upper and lower face symmetric CN VIII: hearing intact CN IX, X: gag intact, uvula midline CN XI: sternocleidomastoid and trapezius muscles intact CN XII: tongue midline Bulk & Tone: normal, no fasciculations. Motor:  muscle strength 5/5 throughout Sensation:  Pinprick, temperature and vibratory sensation intact. Deep Tendon Reflexes:  2+ throughout,  toes downgoing.   Finger to nose testing:  Without dysmetria.   Heel to shin:  Without dysmetria.   Gait:  Normal station and stride.  Romberg negative.    Thank you for allowing me to take part in the care of this patient.  Shon Millet, DO  CC: ***

## 2023-04-02 ENCOUNTER — Other Ambulatory Visit: Payer: Self-pay | Admitting: Family Medicine

## 2023-04-02 ENCOUNTER — Ambulatory Visit: Payer: 59 | Admitting: Neurology

## 2023-04-02 DIAGNOSIS — I10 Essential (primary) hypertension: Secondary | ICD-10-CM

## 2023-04-02 DIAGNOSIS — J432 Centrilobular emphysema: Secondary | ICD-10-CM

## 2023-04-03 NOTE — Telephone Encounter (Signed)
Requested by interface surescripts. Future visit in 2 months .  Requested Prescriptions  Pending Prescriptions Disp Refills   VENTOLIN HFA 108 (90 Base) MCG/ACT inhaler [Pharmacy Med Name: Ventolin HFA 108 (90 Base) MCG/ACT Inhalation Aerosol Solution] 72 g 2    Sig: USE 1 TO 2 INHALATIONS BY MOUTH  INTO THE LUNGS EVERY 6 HOURS AS  NEEDED FOR SHORTNESS OF BREATH  OR WHEEZING     Pulmonology:  Beta Agonists 2 Passed - 04/02/2023 10:27 PM      Passed - Last BP in normal range    BP Readings from Last 1 Encounters:  03/17/23 122/80         Passed - Last Heart Rate in normal range    Pulse Readings from Last 1 Encounters:  03/11/23 67         Passed - Valid encounter within last 12 months    Recent Outpatient Visits           1 month ago Primary insomnia   [place] [facility] [name], [place], MD   2 months ago Chronic fatigue   [place] [facility] [place], [place], MD   6 months ago Atopic dermatitis of both hands   [place] [facility] Merita Norton T, FNP   7 months ago Headache disorder   [place] [name], [place], MD   1 year ago Essential hypertension   [place] [facility] [name], Tawanna Cooler, MD       Future Appointments             In 1 week [name], MD [facility] [place] [facility]astro[facility]ology at [place]   In 1 month Agbor-Etang, Arlys John, MD [facility] at [place]   In 2 months [name], Tawanna Cooler, MD [facility], [facility]   In 2 months Drema Dallas, DO [facility] Neurology

## 2023-04-03 NOTE — Telephone Encounter (Signed)
Requested Prescriptions  Pending Prescriptions Disp Refills   metoprolol tartrate (LOPRESSOR) 25 MG tablet [Pharmacy Med Name: Metoprolol Tartrate 25 MG Oral Tablet] 100 tablet 2    Sig: TAKE ONE-HALF TABLET BY MOUTH  TWICE DAILY     Cardiovascular:  Beta Blockers Passed - 04/02/2023 10:27 PM      Passed - Last BP in normal range    BP Readings from Last 1 Encounters:  03/17/23 122/80         Passed - Last Heart Rate in normal range    Pulse Readings from Last 1 Encounters:  03/11/23 67         Passed - Valid encounter within last 6 months    Recent Outpatient Visits           1 month ago Primary insomnia   [place] [facility] [name], [place], MD   2 months ago Chronic fatigue   [place] [facility] [place], [place], MD   6 months ago Atopic dermatitis of both hands   [facility] Merita Norton T, FNP   7 months ago Headache disorder   [place] [facility] [name], Tawanna Cooler, MD   1 year ago Essential hypertension   [place] [facility] [name], Tawanna Cooler, MD       Future Appointments             In 1 week [name], MD [facility] [place] Gastro[facility]ology at [place]   In 1 month Agbor-Etang, Arlys John, MD [facility] at [place]   In 2 months [name], [place], MD [facility], [facility]   In 2 months Drema Dallas, DO [place] [facility]             lisinopril (ZESTRIL) 20 MG tablet [Pharmacy Med Name: Lisinopril 20 MG Oral Tablet] 200 tablet 2    Sig: TAKE 2 TABLETS BY MOUTH DAILY  WITH LUNCH     Cardiovascular:  ACE Inhibitors Passed - 04/02/2023 10:27 PM      Passed - Cr in normal range and within 180 days    Creatinine, Ser  Date Value Ref Range Status  01/06/2023 0.95 0.57 - 1.00 mg/dL Final         Passed - K in normal range and within 180 days    Potassium  Date Value  Ref Range Status  01/06/2023 5.0 3.5 - 5.2 mmol/L Final         Passed - Patient is not pregnant      Passed - Last BP in normal range    BP Readings from Last 1 Encounters:  03/17/23 122/80         Passed - Valid encounter within last 6 months    Recent Outpatient Visits           1 month ago Primary insomnia   [place] [facility] [place], [place], MD   2 months ago Chronic fatigue   [place] [facility] [place], [place], MD   6 months ago Atopic dermatitis of both hands   [place] [facility] Merita Norton T, FNP   7 months ago Headache disorder   [place] [facility] [name], [place], MD   1 year ago Essential hypertension    [place] [facility] Ronnald Ramp, MD       Future Appointments             In 1 week  [name], MD [facility] at [place]   In 1 month Debbe Odea, MD [facility] HeartCare at [place]   In 2 months [name], Tawanna Cooler, MD [facility], [facility]   In 2 months Drema Dallas, DO [facility] Neurology

## 2023-04-09 ENCOUNTER — Other Ambulatory Visit: Payer: Self-pay | Admitting: Family Medicine

## 2023-04-09 DIAGNOSIS — I10 Essential (primary) hypertension: Secondary | ICD-10-CM

## 2023-04-09 NOTE — Telephone Encounter (Signed)
Requested Prescriptions  Pending Prescriptions Disp Refills   amLODipine (NORVASC) 5 MG tablet [Pharmacy Med Name: amLODIPine Besylate 5 MG Oral Tablet] 90 tablet 3    Sig: TAKE 1 TABLET BY MOUTH AT  BEDTIME     Cardiovascular: Calcium Channel Blockers 2 Passed - 04/09/2023  5:55 AM      Passed - Last BP in normal range    BP Readings from Last 1 Encounters:  03/17/23 122/80         Passed - Last Heart Rate in normal range    Pulse Readings from Last 1 Encounters:  03/11/23 67         Passed - Valid encounter within last 6 months    Recent Outpatient Visits           1 month ago Primary insomnia   [place] [facility] [name], [place], MD   3 months ago Chronic fatigue   [place] [facility] [place], [place], MD   6 months ago Atopic dermatitis of both hands   [place] [facility] Merita Norton T, FNP   7 months ago Headache disorder   [place] [facility] [name], [place], MD   1 year ago Essential hypertension   [place] [facility] [place], Tawanna Cooler, MD       Future Appointments             In 5 days [name], MD [facility] [place] [facility] at [facility]   In 3 weeks Agbor-Etang, Arlys John, MD [facility] Heart[facility] at [place]   In 1 month [name], Tawanna Cooler, MD [place] [facility]   In 2 months Drema Dallas, DO [facility] Neurology

## 2023-04-14 ENCOUNTER — Other Ambulatory Visit: Payer: Self-pay

## 2023-04-14 ENCOUNTER — Ambulatory Visit (INDEPENDENT_AMBULATORY_CARE_PROVIDER_SITE_OTHER): Payer: 59 | Admitting: Gastroenterology

## 2023-04-14 ENCOUNTER — Encounter: Payer: Self-pay | Admitting: Gastroenterology

## 2023-04-14 VITALS — BP 147/81 | HR 70 | Temp 97.1°F | Wt 186.0 lb

## 2023-04-14 DIAGNOSIS — D509 Iron deficiency anemia, unspecified: Secondary | ICD-10-CM

## 2023-04-14 DIAGNOSIS — Z860100 Personal history of colon polyps, unspecified: Secondary | ICD-10-CM

## 2023-04-14 DIAGNOSIS — R109 Unspecified abdominal pain: Secondary | ICD-10-CM | POA: Diagnosis not present

## 2023-04-14 DIAGNOSIS — Z791 Long term (current) use of non-steroidal anti-inflammatories (NSAID): Secondary | ICD-10-CM

## 2023-04-14 DIAGNOSIS — K5903 Drug induced constipation: Secondary | ICD-10-CM | POA: Diagnosis not present

## 2023-04-14 DIAGNOSIS — F17210 Nicotine dependence, cigarettes, uncomplicated: Secondary | ICD-10-CM

## 2023-04-14 DIAGNOSIS — T402X5A Adverse effect of other opioids, initial encounter: Secondary | ICD-10-CM

## 2023-04-14 DIAGNOSIS — K5900 Constipation, unspecified: Secondary | ICD-10-CM

## 2023-04-14 DIAGNOSIS — G8929 Other chronic pain: Secondary | ICD-10-CM

## 2023-04-14 MED ORDER — OMEPRAZOLE 40 MG PO CPDR: 40.0000 mg | DELAYED_RELEASE_CAPSULE | Freq: Two times a day (BID) | ORAL | 3 refills | Status: DC

## 2023-04-14 MED ORDER — NA SULFATE-K SULFATE-MG SULF 17.5-3.13-1.6 GM/177ML PO SOLN: 354.0000 mL | Freq: Once | ORAL | 0 refills | Status: AC

## 2023-04-14 NOTE — Addendum Note (Signed)
Addended by: Adela Ports on: 04/14/2023 03:10 PM   Modules accepted: Orders

## 2023-04-14 NOTE — Addendum Note (Signed)
Addended by: Adela Ports on: 04/14/2023 02:26 PM   Modules accepted: Orders

## 2023-04-14 NOTE — Progress Notes (Signed)
[name] MD, MRCP(U.K) [address]  [address]  [place], [place] [address]  Main: [phone]  Fax: [phone]   Primary Care Physician: Ronnald Ramp, MD  Primary Gastroenterologist:  Dr. [name]   Chief Complaint  Patient presents with   Abdominal Pain   Constipation    HPI: Sara Munoz is a 57 y.o. female  Summary of history : [name] was initially referred back in 05/2016 for hepatitis C GT 3  Completed treatment in 08/2017 and attained SVR. constipation, hepatic steatosis, gastritis.  Upper GI bleed secondary to [facility] powder use.     CT abdomen 03/17/17- increased stool burden and hepatic steatosis.   Colonoscopy 08/2016 - small adenomas excised  RUQ USG 05/07/16 showed hepatic steatosis.  09/18/17 : EGD: normal appearance but bx showed mild active gastritis and negative for H pylori .  02/25/18: H pylori breath test negative   10/19/2018: CT scan of the abdomenPerinephric fat stranding was seen 07/27/2019: Seen at the office complaint of melena was taking BC powders for headaches for years.  08/10/2019 EGD: Localized inflammation seen in the antrum suggestive of gastritis biopsies taken that demonstrated reactive gastropathy.  Negative for H. pylori. 07/11/2021: She had resume taking BC powders was having abdominal pain similar to the past, was having constipation, was smoking was scheduled for an endoscopy but canceled the procedure     Interval history 02/21/2022 -04/14/2023   Since her last visit she canceled her procedures twice in total she has canceled the procedures at least 3 times so far.  At last visit she was using a lot of BC powders and dealing with constipation.  She had been started on Trulance and lactulose.  She was seen for abdominal pain and advised to undergo endoscopy and colonoscopy.   01/06/2023: Hemoglobin 9.7 g MCV of 76, thyroid function test normal.  No recent iron studies.  B12 normal.  Since her last visit she  continues to use BC powders but she says not as often as previously continues to have the abdominal discomfort that she has had in the past decreased appetite only lives on serial.  Denies any constipation since she commenced on Linzess a few months back.  Presently takes 290 mcg a day.  Denies any blood vaginally , says her on occasion has seen blood on the stool as well as in the urine but not often.  Current Outpatient Medications  Medication Sig Dispense Refill   albuterol (PROVENTIL) (2.5 MG/3ML) 0.083% nebulizer solution Take 3 mLs (2.5 mg total) by nebulization every 6 (six) hours as needed for wheezing or shortness of breath. 75 mL 12   amLODipine (NORVASC) 5 MG tablet TAKE 1 TABLET BY MOUTH EVERYDAY AT BEDTIME 90 tablet 0   aspirin EC 81 MG tablet Take 81 mg by mouth daily. Swallow whole.     atorvastatin (LIPITOR) 40 MG tablet Take 1 tablet (40 mg total) by mouth every evening. 90 tablet 3   diazepam (VALIUM) 5 MG tablet 1 po 30 minutes prior to MRI scan. May repeat x 1 right before MRI scan if needed. You will need a driver. 2 tablet 0   diclofenac (CATAFLAM) 50 MG tablet Take 50 mg by mouth 2 (two) times daily.     docusate sodium (COLACE) 100 MG capsule Take 100 mg by mouth 2 (two) times daily.     ELDERBERRY PO Take 1 tablet by mouth daily at 6 (six) AM.     gabapentin (NEURONTIN)  600 MG tablet Take 600 mg by mouth 4 (four) times daily.     lactulose (CHRONULAC) 10 GM/15ML solution Take 30 mLs by mouth 2 (two) times daily.     LINZESS 290 MCG CAPS capsule Take 290 mcg by mouth daily as needed (constipation).     lisinopril (ZESTRIL) 40 MG tablet Take 40 mg by mouth daily.     metoprolol tartrate (LOPRESSOR) 25 MG tablet TAKE 1 TABLET BY MOUTH TWICE A DAY 180 tablet 0   mometasone (ELOCON) 0.1 % cream Apply 1 Application topically daily as needed (hand rash).     mometasone-formoterol (DULERA) 100-5 MCG/ACT AERO Inhale 2 puffs into the lungs every morning.     Multiple  Vitamins-Minerals (HAIR SKIN & NAILS PO) Take 1 tablet by mouth daily at 6 (six) AM.     naloxone (NARCAN) 4 MG/0.1ML LIQD nasal spray kit Place 1 spray into the nose as needed (opioid overdose).     Omega-3 Fatty Acids (OMEGA 3 PO) Take 1 capsule by mouth daily at 6 (six) AM.     omeprazole (PRILOSEC) 40 MG capsule TAKE 1 CAPSULE BY MOUTH TWICE A DAY 180 capsule 3   ondansetron (ZOFRAN) 8 MG tablet Take 1 tablet (8 mg total) by mouth every 8 (eight) hours as needed for nausea or vomiting. 90 tablet 1   oxyCODONE (ROXICODONE) 15 MG immediate release tablet Take 15 mg by mouth every 4 (four) hours as needed for pain.     sucralfate (CARAFATE) 1 GM/10ML suspension Take 10 mLs (1 g total) by mouth 4 (four) times daily. 840 mL 0   Tiotropium Bromide Monohydrate (SPIRIVA RESPIMAT) 2.5 MCG/ACT AERS Take 1 puff by mouth in the morning and at bedtime. 12 g 11   TURMERIC PO Take 1 tablet by mouth daily at 6 (six) AM.     VENTOLIN HFA 108 (90 Base) MCG/ACT inhaler USE 1 TO 2 INHALATIONS BY MOUTH  INTO THE LUNGS EVERY 6 HOURS AS  NEEDED FOR SHORTNESS OF BREATH  OR WHEEZING 72 g 2   zolpidem (AMBIEN) 5 MG tablet Take 1 tablet (5 mg total) by mouth at bedtime as needed for sleep. 30 tablet 0   Na Sulfate-K Sulfate-Mg Sulf 17.5-3.13-1.6 GM/177ML SOLN Take 354 mLs by mouth once for 1 dose. Starting at 5 PM take one bottle and pour into the supplied cup, add cool water to the fill 16 oz line and drink all. Then 5 hours before procedure pour the second bottle into the supplied cup, add cool water to the fill 16 oz line and drink all. 354 mL 0   No current facility-administered medications for this visit.    Allergies as of 04/14/2023 - Review Complete 04/14/2023  Allergen Reaction Noted   Cyclobenzaprine Hives, Swelling, and Rash 11/13/2013   Levofloxacin Hives, Swelling, and Rash 02/27/2012   Chlorpheniramine maleate  02/12/2022   Phenergan [promethazine hcl] Other (See Comments) 02/21/2017   Phenylephrine hcl  Other (See Comments) 02/12/2022   Toradol [ketorolac tromethamine] Swelling and Other (See Comments) 01/17/2015   Zoloft [sertraline hcl] Swelling 05/31/2015   Cephalexin Hives and Rash 10/22/2010   Ketorolac Rash 10/22/2010   Tramadol Hives, Swelling, Other (See Comments), and Rash 01/17/2015      ROS:  General: Negative for anorexia, weight loss, fever, chills, fatigue, weakness. ENT: Negative for hoarseness, difficulty swallowing , nasal congestion. CV: Negative for chest pain, angina, palpitations, dyspnea on exertion, peripheral edema.  Respiratory: Negative for dyspnea at rest, dyspnea on exertion, cough,  sputum, wheezing.  GI: See history of present illness. GU:  Negative for dysuria, hematuria, urinary incontinence, urinary frequency, nocturnal urination.  Endo: Negative for unusual weight change.    Physical Examination:   BP (!) 147/81   Pulse 70   Temp (!) 97.1 F (36.2 C) (Oral)   Wt 186 lb (84.4 kg)   LMP 12/16/2017 (Approximate)   BMI 31.93 kg/m   General: Well-nourished, well-developed in no acute distress.  Eyes: No icterus. Conjunctivae pink. Neuro: Alert and oriented x 3.  Grossly intact. Skin: Warm and dry, no jaundice.   Psych: Alert and cooperative, normal mood and affect.   Imaging Studies: No results found.  Assessment and Plan:   TEIONNA POPKE is a 57 y.o. y/o female with a prior history of constipation, GI bleed secondary to NSAID use, hepatic steatosis here to see me for chronic abdominal pain which is very likely secondary to inflammation of the GI tract due to chronic NSAID use i.e. BC powder, constipation secondary to oxycodone use and aerophagia secondary to smoking.  She has been advised in the past to stop the [facility] powder as well as smoking which she really has not followed through, canceled her procedures scheduled in the past due to transportation issues.  Presently has new onset microcytic anemia unfortunately no iron studies have been  checked and she is on oral iron.       Plan  1.  Iron studies, urinalysis, celiac serology, H. pylori breath test  2.  EGD and colonoscopy to evaluate for anemia as well as surveillance of past history of colon polyps, continue Prilosec 40 mg twice a day. 2.  Stop all NSAID use and continue to do so. 3.  Continue Linzess for constipation 4.  If EGD and colonoscopy are negative will need capsule study of small bowel    Dr [name]  MD,MRCP [facility]) Follow up in 8 weeks with Celso Amy

## 2023-04-15 ENCOUNTER — Other Ambulatory Visit: Payer: Self-pay | Admitting: Obstetrics and Gynecology

## 2023-04-15 LAB — H. PYLORI BREATH TEST: H pylori Breath Test: NEGATIVE

## 2023-04-15 LAB — URINALYSIS
Bilirubin, UA: NEGATIVE
Glucose, UA: NEGATIVE
Ketones, UA: NEGATIVE
Nitrite, UA: NEGATIVE
Protein,UA: NEGATIVE
RBC, UA: NEGATIVE
Specific Gravity, UA: 1.014 (ref 1.005–1.030)
Urobilinogen, Ur: 0.2 mg/dL (ref 0.2–1.0)
pH, UA: 6 (ref 5.0–7.5)

## 2023-04-15 NOTE — Patient Outreach (Signed)
Medicaid Managed Care   Nurse Care Manager Note  04/15/2023 Name:  Sara Munoz MRN:  [id number] DOB:  01-16-1966  Sara Munoz is an 57 y.o. year old female who is a primary patient of [name], Tawanna Cooler, MD.  The Medicaid Managed Care Coordination team was consulted for assistance with:    Chronic case management follow up, GERD, CAD, HTN, tobacco use, chronic pain, COPD, OSA, anxiety, osteoarthritis  Sara Munoz was given information about Medicaid Managed Care Coordination team services today. Sara Munoz Patient agreed to services and verbal consent obtained.  Engaged with patient by telephone for follow up visit in response to provider referral for case management and/or care coordination services.   Assessments/Interventions:  Review of past medical history, allergies, medications, health status, including review of consultants reports, laboratory and other test data, was performed as part of comprehensive evaluation and provision of chronic care management services.  SDOH (Social Determinants of Health) assessments and interventions performed: SDOH Interventions    Flowsheet Row Patient Outreach Telephone from 04/15/2023 in [place] POPULATION HEALTH DEPARTMENT Patient Outreach Telephone from 03/17/2023 in [place] POPULATION HEALTH DEPARTMENT Patient Outreach Telephone from 02/03/2023 in [place] POPULATION HEALTH DEPARTMENT Patient Outreach Telephone from 01/01/2023 in [place] POPULATION HEALTH DEPARTMENT Patient Outreach Telephone from 12/02/2022 in [place] POPULATION HEALTH DEPARTMENT Patient Outreach Telephone from 10/23/2022 in [place] POPULATION HEALTH DEPARTMENT  SDOH Interventions        Food Insecurity Interventions -- -- -- -- -- Intervention Not Indicated  Housing Interventions -- -- Intervention Not Indicated -- -- --  Transportation Interventions -- -- -- Intervention Not Indicated -- --  Utilities Interventions -- -- -- -- Intervention Not  Indicated --  Alcohol Usage Interventions -- -- -- -- Intervention Not Indicated (Score <7) --  Financial Strain Interventions -- -- -- Intervention Not Indicated -- --  Physical Activity Interventions Intervention Not Indicated, Other (Comments)  [not able to engage in this type of exercixe] -- -- -- -- --  Stress Interventions -- Provide Counseling, Other (Comment)  [sees therapist as scheduled] -- -- -- --  Social Connections Interventions Intervention Not Indicated -- -- -- -- --  Health Literacy Interventions -- -- Intervention Not Indicated -- -- --     Care Plan Allergies  Allergen Reactions   Cyclobenzaprine Hives, Swelling and Rash    Facial/lip swelling      Levofloxacin Hives, Swelling and Rash   Chlorpheniramine Maleate     Other reaction(s): Unknown   Phenergan [Promethazine Hcl] Other (See Comments)    Agitation.    Phenylephrine Hcl Other (See Comments)   Toradol [Ketorolac Tromethamine] Swelling and Other (See Comments)    Facial/tongue swelling    Zoloft [Sertraline Hcl] Swelling    Tongue swelling      Cephalexin Hives and Rash   Ketorolac Rash   Tramadol Hives, Swelling, Other (See Comments) and Rash    Lip swelling     Medications Reviewed Today     Reviewed by [name], RN (Registered Nurse) on 04/15/23 at 1314  Med List Status: <None>   Medication Order Taking? Sig Documenting Provider Last Dose Status Informant  albuterol (PROVENTIL) (2.5 MG/3ML) 0.083% nebulizer solution [id number] No Take 3 mLs (2.5 mg total) by nebulization every 6 (six) hours as needed for wheezing or shortness of breath. [name], [name], MD Taking Active Self  amLODipine (NORVASC) 5 MG tablet [id number] No TAKE 1 TABLET BY MOUTH EVERYDAY AT BEDTIME [name], [name], MD Taking  Active   aspirin EC 81 MG tablet [id number] No Take 81 mg by mouth daily. Swallow whole. [provider] Taking Active Self  atorvastatin (LIPITOR) 40 MG tablet [id number] No Take  1 tablet (40 mg total) by mouth every evening. Debbe Odea, MD Taking Active Self  diazepam (VALIUM) 5 MG tablet [id number] No 1 po 30 minutes prior to MRI scan. May repeat x 1 right before MRI scan if needed. You will need a driver. Drake Leach, [name] Taking Active   diclofenac (CATAFLAM) 50 MG tablet [id number] No Take 50 mg by mouth 2 (two) times daily. [provider] Taking Active   docusate sodium (COLACE) 100 MG capsule [id number] No Take 100 mg by mouth 2 (two) times daily. [provider] Taking Active Self  ELDERBERRY PO [id number] No Take 1 tablet by mouth daily at 6 (six) AM. [provider] Taking Active   gabapentin (NEURONTIN) 600 MG tablet [id number] No Take 600 mg by mouth 4 (four) times daily. [provider] Taking Active Self  lactulose (CHRONULAC) 10 GM/15ML solution [id number] No Take 30 mLs by mouth 2 (two) times daily. [provider] Taking Active Self  LINZESS 290 MCG CAPS capsule [id number] No Take 290 mcg by mouth daily as needed (constipation). [provider] Taking Active Self  lisinopril (ZESTRIL) 40 MG tablet [id number] No Take 40 mg by mouth daily. [provider] Taking Active   metoprolol tartrate (LOPRESSOR) 25 MG tablet [id number] No TAKE 1 TABLET BY MOUTH TWICE A DAY [name], [name], MD Taking Active   mometasone (ELOCON) 0.1 % cream [id number] No Apply 1 Application topically daily as needed (hand rash). Poggi, Excell Seltzer, MD Taking Active   mometasone-formoterol [facility]) 100-5 MCG/ACT Sandrea Matte [id number] No Inhale 2 puffs into the lungs every morning. Poggi, Excell Seltzer, MD Taking Active   Multiple Vitamins-Minerals (HAIR SKIN & NAILS PO) [id number] No Take 1 tablet by mouth daily at 6 (six) AM. [provider] Taking Active   naloxone [facility]) 4 MG/0.1ML LIQD nasal spray kit [id number] No Place 1 spray into the nose as needed (opioid overdose). [provider] Taking Active Self            Med Note Richardean Canal May 03, 2021 11:23 AM)    Omega-3 Fatty Acids (OMEGA 3 PO) [id number] No Take 1 capsule by mouth daily at 6 (six) AM. [provider] Taking Active   omeprazole (PRILOSEC) 40 MG capsule [id number]  Take 1 capsule (40 mg total) by mouth 2 (two) times daily. [name], MD  Active   ondansetron [facility]) 8 MG tablet [id number] No Take 1 tablet (8 mg total) by mouth every 8 (eight) hours as needed for nausea or vomiting. [name], [name], MD Taking Active   oxyCODONE (ROXICODONE) 15 MG immediate release tablet [id number] No Take 15 mg by mouth every 4 (four) hours as needed for pain. [provider] Taking Active Self  sucralfate (CARAFATE) 1 GM/10ML suspension [id number] No Take 10 mLs (1 g total) by mouth 4 (four) times daily. [name], MD Taking Active   Tiotropium Bromide Monohydrate (SPIRIVA RESPIMAT) 2.5 MCG/ACT AERS [id number] No Take 1 puff by mouth in the morning and at bedtime. Jacky Kindle, FNP Taking Active   TURMERIC PO [id number] No Take 1 tablet by mouth daily at 6 (six) AM. [provider] Taking Active   VENTOLIN HFA 108 (90 Base) MCG/ACT inhaler [id number] No USE 1 TO 2 INHALATIONS BY MOUTH  INTO THE  LUNGS EVERY 6 HOURS AS  NEEDED FOR SHORTNESS OF BREATH  OR WHEEZING [name], [name], MD Taking Active   zolpidem (AMBIEN) 5 MG tablet [id number] No Take 1 tablet (5 mg total) by mouth at bedtime as needed for sleep. 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   History of lumbar fusion 03/18/2023   S/P cervical spinal fusion 03/18/2023   Tendinitis of upper biceps tendon of right shoulder 01/17/2023   Rotator cuff tendinitis, right 01/17/2023   Chronic fatigue 01/06/2023   Prediabetes 01/06/2023   Status post left rotator cuff repair 12/04/2022   Foreign body left in shoulder 12/04/2022   Atopic  dermatitis of both hands 09/12/2022   Headache disorder 08/13/2022   Nontraumatic incomplete tear of left rotator cuff 05/28/2022   Spondylolisthesis 04/19/2022   Encounter for annual physical examination excluding gynecological examination in a patient older than 17 years 04/03/2022   Essential hypertension 03/11/2022   Lumbar spondylosis 02/12/2022   Chronic migraine w/o aura w/o status migrainosus, not intractable 07/09/2021   Obstructive sleep apnea 07/09/2021   Body mass index (BMI) 32.0-32.9, adult 06/07/2021   Pseudoarthrosis of cervical spine (HCC) 05/14/2021   Lumbar radiculopathy 05/23/2020   Sacroiliac inflammation (HCC) 11/25/2019   Sepsis (HCC) 10/19/2018   Severe sepsis (HCC) 10/19/2018   Cervical radiculopathy 02/12/2018   Chronic migraine 02/12/2018   Paresthesia 08/29/2017   Neck pain 08/29/2017   Daily headache 08/29/2017   Chronic active hepatitis (HCC) 07/29/2017   Neurogenic pain 07/31/2016   Chronic pain syndrome 06/05/2016   Carotid artery stenosis 02/27/2016   Chronic constipation 02/27/2016   Chronic nausea 02/27/2016   Hypercholesterolemia 02/27/2016   Osteoarthritis 02/27/2016   PTSD (post-traumatic stress disorder) 02/27/2016   TIA (transient ischemic attack) 02/27/2016   Long term current use of opiate analgesic 02/27/2016   Long term prescription opiate use 02/27/2016   Encounter for pain management consult 02/27/2016   Chronic hip pain (Location of Tertiary source of pain) (Bilateral) (L>R) 02/27/2016   Chronic knee pain (Bilateral) (L>R) 02/27/2016   Chronic shoulder pain (Bilateral) (L>R) 02/27/2016   Chronic sacroiliac joint pain (Bilateral) (L>R) 02/27/2016   Chronic low back pain (Location of Primary Source of Pain) (Bilateral) (L>R) 02/27/2016   Chronic lower extremity pain (Location of Secondary source of pain) (Bilateral) (L>R) 02/27/2016   Osteoarthritis of hip (Bilateral) (L>R) 02/27/2016   Chronic neck pain (posterior midline)  (Bilateral) (L>R) 02/27/2016   Cervicogenic headache (Bilateral) (L>R) 02/27/2016   Occipital headache (Bilateral) (L>R) 02/27/2016   Chronic upper extremity pain (Bilateral) (L>R) 02/27/2016   Chronic Cervical radicular pain (Bilateral) (L>R) 02/27/2016   Chronic lumbar radicular pain (Right) (L5 dermatome) 02/27/2016   Lumbar facet syndrome (Bilateral) (L>R) 02/27/2016   Long term prescription benzodiazepine use 02/27/2016   Hypertension    Chronic obstructive pulmonary disease (HCC) 10/09/2015   GERD (gastroesophageal reflux disease) 10/09/2015   Non-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/2014 08/02/2015   Hyperlipidemia 08/02/2015   Costochondritis 08/02/2015   Drug abuse, 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   GAD (generalized anxiety disorder) 09/03/2014   Narcotic dependence (HCC) 09/03/2014   PVD (peripheral vascular disease) (HCC) 09/03/2014   Smoker 09/03/2014   Cigarette nicotine dependence with nicotine-induced disorder 07/08/2014   Anemia of chronic disease 03/19/2014   Narcotic abuse, continuous (HCC) 03/19/2014   Polysubstance abuse (HCC) 03/19/2014   MSSA (methicillin susceptible Staphylococcus  aureus) septicemia (HCC) 03/07/2014   Hypomagnesemia 01/04/2014   Chronic cough 09/04/2011   Sleep apnea, obstructive 09/04/2011   Sinusitis, acute 09/04/2011   Conditions to be addressed/monitored per PCP order:  Chronic case management follow up, GERD, CAD, HTN, tobacco use, chronic pain, COPD, OSA, anxiety, osteoarthritis  Care Plan : RN Care Manager Plan of Care  Updates made by [name], RN since 04/15/2023 12:00 AM     Problem: Health Promotion or Disease Self-Management (General Plan of Care)      Long-Range Goal: Chronic Disease Management   Start Date: 09/12/2022  Expected End Date: 07/16/2023  Priority: High  Note:   Current Barriers:  Knowledge Deficits related to plan of care for management of GERD, CAD, HTN, PVD, TIA, migranes, COPD, OSA, tobacco use, osteoarthritis, chronic  pain, anxiety  Chronic Disease Management support and education needs related to  GERD, CAD, HTN, PVD, TIA, migranes, COPD, OSA, tobacco use, osteoarthritis, chronic pain, anxiety  04/15/23: Right shoulder doing well.  To have colonoscopy and EGD Monday, abdominal pain, constipation, ? Bleeding from hemorrhoids.  BP stable, smokes 1 ppd  RNCM Clinical Goal(s):  Patient will verbalize understanding of plan for management of GERD, CAD, HTN, PVD, TIA, migranes, COPD, OSA, tobacco use, osteoarthritis, chronic pain, anxiety   as evidenced by patient report verbalize basic understanding of  GERD, CAD, HTN, PVD, TIA, migranes, COPD, OSA, tobacco use, osteoarthritis, chronic pain, anxiety  disease process and self health management plan as evidenced by patient report take all medications exactly as prescribed and will call provider for medication related questions as evidenced by patient report  demonstrate understanding of rationale for each prescribed medication as evidenced by patient report attend all scheduled medical appointments as evidenced by patient report and EMR review demonstrate Ongoing adherence to prescribed treatment plan for  GERD, CAD, HTN, PVD, TIA, migranes, COPD, OSA, tobacco use, osteoarthritis, chronic pain, anxiety as evidenced by patient report continue to work with RN Care Manager to address care management and care coordination needs related to GERD, CAD, HTN, PVD, TIA, migranes, COPD, OSA, tobacco use, osteoarthritis, chronic pain, anxiety  as evidenced by adherence to CM Team Scheduled appointments through collaboration with RN Care manager, provider, and care team.   Interventions: Inter-disciplinary care team collaboration (see longitudinal plan of care) Evaluation of current treatment plan related to  self management and patient's adherence to plan as established by provider 01/03/23-discussed protein drink supplementation, MVI  CAD Interventions: (Status:  New goal.) Long  Term Goal Assessed understanding of CAD diagnosis Medications reviewed including medications utilized in CAD treatment plan Counseled on importance of regular laboratory monitoring as prescribed Reviewed Importance of taking all medications as prescribed Reviewed Importance of attending all scheduled provider appointments Advised to report any changes in symptoms or exercise tolerance Screening for signs and symptoms of depression related to chronic disease state Assessed social determinant of health barriers  COPD Interventions:  (Status:  New goal.) Long Term Goal Advised patient to self assesses COPD action plan zone and make appointment with provider if in the yellow zone for 48 hours without improvement Advised patient to engage in light exercise as tolerated 3-5 days a week to aid in the the management of COPD Provided education about and advised patient to utilize infection prevention strategies to reduce risk of respiratory infection Discussed the importance of adequate rest and management of fatigue with COPD Assessed social determinant of health barriers  Hypertension Interventions:  (Status:  New goal.) Long Term Goal  Last practice recorded BP readings:  BP Readings from Last 3 Encounters:        01/06/23   118/66  03/17/23         124/117 04/15/23         117/80  Most recent eGFR/CrCl:  Lab Results  Component Value Date   EGFR 83 04/08/2022    No components found for: "CRCL"  Evaluation of current treatment plan related to hypertension self management and patient's adherence to plan as established by provider Reviewed medications with patient and discussed importance of compliance Discussed plans with patient for ongoing care management follow up and provided patient with direct contact information for care management team Advised patient, providing education and rationale, to monitor blood pressure daily and record, calling PCP for findings outside established  parameters Reviewed scheduled/upcoming provider appointments including:  Discussed complications of poorly controlled blood pressure such as heart disease, stroke, circulatory complications, vision complications, kidney impairment, sexual dysfunction Assessed social determinant of health barriers  Pain Interventions:  (Status:  New goal.) Long Term Goal Pain assessment performed Medications reviewed Reviewed provider established plan for pain management Discussed importance of adherence to all scheduled medical appointments Counseled on the importance of reporting any/all new or changed pain symptoms or management strategies to pain management provider Advised patient to report to care team affect of pain on daily activities Reviewed with patient prescribed pharmacological and nonpharmacological pain relief strategies Assessed social determinant of health barriers  Patient Goals/Self-Care Activities: Take all medications as prescribed Attend all scheduled provider appointments Call pharmacy for medication refills 3-7 days in advance of running out of medications Perform all self care activities independently  Perform IADL's (shopping, preparing meals, housekeeping, managing finances) independently Call provider office for new concerns or questions  03/17/23:  To reschedule GI appt-completed  Follow Up Plan:  The patient has been provided with contact information for the care management team and has been advised to call with any health related questions or concerns.  The care management team will reach out to the patient again over the next 45 business  days.   Follow Up:  Patient agrees to Care Plan and Follow-up.  Plan: The Managed Medicaid care management team will reach out to the patient again over the next 30 business  days. and The  Patient has been provided with contact information for the Managed Medicaid care management team and has been advised to call with any health related  questions or concerns.  Date/time of next scheduled RN care management/care coordination outreach: 05/16/23 at 1230

## 2023-04-15 NOTE — Patient Instructions (Signed)
Visit Information  [name] was given information about Medicaid Managed Care team care coordination services as a part of their [place] [employer] Medicaid benefit. [name] verbally consented to engagement with the [facility] Managed Care team.   If you are experiencing a medical emergency, please call 911 or report to your local emergency department or urgent care.   If you have a non-emergency medical problem during routine business hours, please contact your provider's office and ask to speak with a nurse.   For questions related to your [place] health plan, please call: [phone]  If you would like to schedule transportation through your [place] plan, please call the following number at least 2 days in advance of your appointment: [phone].   There is no limit to the number of trips during the year between medical appointments, healthcare facilities, or pharmacies. Transportation must be scheduled at least 2 business days before but not more than thirty 30 days before of your appointment.  Call the Behavioral Health Crisis Line at [phone], at any time, 24 hours a day, 7 days a week. If you are in danger or need immediate medical attention call 911.  If you would like help to quit smoking, call 1-800-QUIT-NOW (910 101 3389) OR Espaol: 1-855-Djelo-Ya (3-329-518-8416) o para ms informacin haga clic aqu or Text READY to 606-301 to register via text  [name] - following are the goals we discussed in your visit today:  Timeframe:  Long-Range Goal Priority:  High Start Date:       12/20/20                      Expected End Date:   ongoing     Follow up date:  05/16/23  - schedule appointment for flu shot - schedule appointment for vaccines needed due to my age or health - schedule recommended health tests (blood work, mammogram, colonoscopy, pap test) - schedule and keep appointment for annual check-up   Why is this  important?   Screening tests can find diseases early when they are easier to treat.  Your doctor or nurse will talk with you about which tests are important for you.  Getting shots for common diseases like the flu and shingles will help prevent them.   04/15/23:  to have EGD and colonoscopy Monday, spinal stimulator 11/7  Patient verbalizes understanding of instructions and care plan provided today and agrees to view in MyChart. Active MyChart status and patient understanding of how to access instructions and care plan via MyChart confirmed with patient.     The Managed Medicaid care management team will reach out to the patient again over the next 30 business  days.  The  Patient has been provided with contact information for the Managed Medicaid care management team and has been advised to call with any health related questions or concerns.   Kathi Der RN, BSN [place]  [facility] Care Management Coordinator - Managed Medicaid High Risk [phone]   Following is a copy of your plan of care:  Care Plan : RN Care Manager Plan of Care  Updates made by [name], RN since 04/15/2023 12:00 AM     Problem: Health Promotion or Disease Self-Management (General Plan of Care)      Long-Range Goal: Chronic Disease Management   Start Date: 09/12/2022  Expected End Date: 07/16/2023  Priority: High  Note:   Current Barriers:  Knowledge Deficits related to plan of care  for management of GERD, CAD, HTN, PVD, TIA, migranes, COPD, OSA, tobacco use, osteoarthritis, chronic pain, anxiety  Chronic Disease Management support and education needs related to  GERD, CAD, HTN, PVD, TIA, migranes, COPD, OSA, tobacco use, osteoarthritis, chronic pain, anxiety  04/15/23: Right shoulder doing well.  To have colonoscopy and EGD Monday, abdominal pain, constipation, ? Bleeding from hemorrhoids.  BP stable, smokes 1 ppd  RNCM Clinical Goal(s):  Patient will verbalize understanding of plan  for management of GERD, CAD, HTN, PVD, TIA, migranes, COPD, OSA, tobacco use, osteoarthritis, chronic pain, anxiety   as evidenced by patient report verbalize basic understanding of  GERD, CAD, HTN, PVD, TIA, migranes, COPD, OSA, tobacco use, osteoarthritis, chronic pain, anxiety  disease process and self health management plan as evidenced by patient report take all medications exactly as prescribed and will call provider for medication related questions as evidenced by patient report  demonstrate understanding of rationale for each prescribed medication as evidenced by patient report attend all scheduled medical appointments as evidenced by patient report and EMR review demonstrate Ongoing adherence to prescribed treatment plan for  GERD, CAD, HTN, PVD, TIA, migranes, COPD, OSA, tobacco use, osteoarthritis, chronic pain, anxiety as evidenced by patient report continue to work with RN Care Manager to address care management and care coordination needs related to GERD, CAD, HTN, PVD, TIA, migranes, COPD, OSA, tobacco use, osteoarthritis, chronic pain, anxiety  as evidenced by adherence to CM Team Scheduled appointments through collaboration with RN Care manager, provider, and care team.   Interventions: Inter-disciplinary care team collaboration (see longitudinal plan of care) Evaluation of current treatment plan related to  self management and patient's adherence to plan as established by provider 01/03/23-discussed protein drink supplementation, MVI  CAD Interventions: (Status:  New goal.) Long Term Goal Assessed understanding of CAD diagnosis Medications reviewed including medications utilized in CAD treatment plan Counseled on importance of regular laboratory monitoring as prescribed Reviewed Importance of taking all medications as prescribed Reviewed Importance of attending all scheduled provider appointments Advised to report any changes in symptoms or exercise tolerance Screening for signs  and symptoms of depression related to chronic disease state Assessed social determinant of health barriers  COPD Interventions:  (Status:  New goal.) Long Term Goal Advised patient to self assesses COPD action plan zone and make appointment with provider if in the yellow zone for 48 hours without improvement Advised patient to engage in light exercise as tolerated 3-5 days a week to aid in the the management of COPD Provided education about and advised patient to utilize infection prevention strategies to reduce risk of respiratory infection Discussed the importance of adequate rest and management of fatigue with COPD Assessed social determinant of health barriers  Hypertension Interventions:  (Status:  New goal.) Long Term Goal Last practice recorded BP readings:  BP Readings from Last 3 Encounters:        01/06/23   118/66  03/17/23         124/117 04/15/23         117/80  Most recent eGFR/CrCl:  Lab Results  Component Value Date   EGFR 83 04/08/2022    No components found for: "CRCL"  Evaluation of current treatment plan related to hypertension self management and patient's adherence to plan as established by provider Reviewed medications with patient and discussed importance of compliance Discussed plans with patient for ongoing care management follow up and provided patient with direct contact information for care management team Advised patient, providing education and  rationale, to monitor blood pressure daily and record, calling PCP for findings outside established parameters Reviewed scheduled/upcoming provider appointments including:  Discussed complications of poorly controlled blood pressure such as heart disease, stroke, circulatory complications, vision complications, kidney impairment, sexual dysfunction Assessed social determinant of health barriers  Pain Interventions:  (Status:  New goal.) Long Term Goal Pain assessment performed Medications reviewed Reviewed  provider established plan for pain management Discussed importance of adherence to all scheduled medical appointments Counseled on the importance of reporting any/all new or changed pain symptoms or management strategies to pain management provider Advised patient to report to care team affect of pain on daily activities Reviewed with patient prescribed pharmacological and nonpharmacological pain relief strategies Assessed social determinant of health barriers  Patient Goals/Self-Care Activities: Take all medications as prescribed Attend all scheduled provider appointments Call pharmacy for medication refills 3-7 days in advance of running out of medications Perform all self care activities independently  Perform IADL's (shopping, preparing meals, housekeeping, managing finances) independently Call provider office for new concerns or questions  03/17/23:  To reschedule GI appt-completed  Follow Up Plan:  The patient has been provided with contact information for the care management team and has been advised to call with any health related questions or concerns.  The care management team will reach out to the patient again over the next 45 business  days.

## 2023-04-17 ENCOUNTER — Other Ambulatory Visit: Payer: Self-pay | Admitting: Neurological Surgery

## 2023-04-17 ENCOUNTER — Other Ambulatory Visit: Payer: Self-pay

## 2023-04-17 DIAGNOSIS — M5412 Radiculopathy, cervical region: Secondary | ICD-10-CM

## 2023-04-17 DIAGNOSIS — D509 Iron deficiency anemia, unspecified: Secondary | ICD-10-CM

## 2023-04-17 DIAGNOSIS — R109 Unspecified abdominal pain: Secondary | ICD-10-CM

## 2023-04-18 ENCOUNTER — Other Ambulatory Visit: Payer: Self-pay

## 2023-04-18 ENCOUNTER — Encounter (HOSPITAL_COMMUNITY): Payer: Self-pay | Admitting: Urgent Care

## 2023-04-18 ENCOUNTER — Encounter
Admission: RE | Admit: 2023-04-18 | Discharge: 2023-04-18 | Disposition: A | Discharge status: Home / Self Care | Payer: 59 | Source: Ambulatory Visit | Attending: Neurosurgery | Admitting: Neurosurgery

## 2023-04-18 ENCOUNTER — Encounter: Payer: Self-pay | Admitting: Neurosurgery

## 2023-04-18 VITALS — BP 127/72 | HR 69 | Resp 18 | Ht 64.0 in | Wt 185.5 lb

## 2023-04-18 DIAGNOSIS — I10 Essential (primary) hypertension: Secondary | ICD-10-CM | POA: Insufficient documentation

## 2023-04-18 DIAGNOSIS — R7303 Prediabetes: Secondary | ICD-10-CM | POA: Insufficient documentation

## 2023-04-18 DIAGNOSIS — Z01812 Encounter for preprocedural laboratory examination: Secondary | ICD-10-CM | POA: Insufficient documentation

## 2023-04-18 LAB — URINALYSIS, ROUTINE W REFLEX MICROSCOPIC
Bilirubin Urine: NEGATIVE
Glucose, UA: NEGATIVE mg/dL
Hgb urine dipstick: NEGATIVE
Ketones, ur: NEGATIVE mg/dL
Leukocytes,Ua: NEGATIVE
Nitrite: NEGATIVE
Protein, ur: NEGATIVE mg/dL
Specific Gravity, Urine: 1.014 (ref 1.005–1.030)
pH: 5 (ref 5.0–8.0)

## 2023-04-18 LAB — TYPE AND SCREEN
ABO/RH(D): O POS
Antibody Screen: NEGATIVE

## 2023-04-18 LAB — BASIC METABOLIC PANEL
Anion gap: 7 (ref 5–15)
BUN: 15 mg/dL (ref 6–20)
CO2: 27 mmol/L (ref 22–32)
Calcium: 9 mg/dL (ref 8.9–10.3)
Chloride: 104 mmol/L (ref 98–111)
Creatinine, Ser: 0.77 mg/dL (ref 0.44–1.00)
GFR, Estimated: >60 mL/min (ref >60)
Glucose, Bld: 98 mg/dL (ref 70–99)
Potassium: 4.2 mmol/L (ref 3.5–5.1)
Sodium: 138 mmol/L (ref 135–145)

## 2023-04-18 LAB — SURGICAL PCR SCREEN
MRSA, PCR: NEGATIVE
Staphylococcus aureus: NEGATIVE

## 2023-04-18 NOTE — Progress Notes (Signed)
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 Clinical nursing staff has updated patient's PMH/PSHx, current medication list, and drug allergies/intolerances to ensure comprehensive history available to assist in medical decision making as it pertains to the aforementioned surgical procedure and anticipated anesthetic course. Extensive review of available clinical information personally performed. Sara Munoz PMH and PSHx updated with any diagnoses/procedures that  may have been inadvertently omitted during her intake with the pre-admission testing department's nursing staff.  Clinical Discussion:  Sara Munoz is a 57 y.o. female who is submitted for pre-surgical anesthesia review and clearance prior to her undergoing the above procedure. Patient is a Current Smoker (17.5 pack years). Pertinent PMH includes: CAD, ? MI, PVD, carotid artery disease, aortic atherosclerosis, CVA/TIA, atypical angina, HTN, HLD, asthma, chronic nausea, GERD (on daily PPI + Carafate), anemia, cervical DDD (s/p ACDF C5-C7, PSIF C5-T1), lumbar DDD (s/p PLIF L5-S1), chronic lower back pain (on chronic opioid therapy), anxiety, depression, PTSD, insomnia (on hypnotic PRN).   Patient is followed by cardiology Sara Cecil, MD). She was last seen in the cardiology clinic on 11/01/2022; notes reviewed. At the time of her clinic visit, patient  doing well overall from a cardiovascular perspective. Patient denied any chest pain, shortness of breath, PND, orthopnea, palpitations, significant peripheral edema, weakness, fatigue, vertiginous symptoms, or presyncope/syncope. Patient with a past medical history significant for cardiovascular diagnoses. Documented physical exam was grossly benign, providing no evidence of acute exacerbation and/or decompensation of the patient's known cardiovascular conditions.  Of note, complete records regarding patient's cardiovascular history unavailable for review at time of consult.  Information gathered from patient report and from notes provided by her local cardiologist.  Patient reported to have suffered an MI in approximately 2012.  Details surrounding cardiovascular event unavailable for review.  Patient has undergone multiple diagnostic LEFT heart catheterizations in the past. Patient unable to advise as to whether or not she has undergone PCI in the past.  Notes from various cardiology providers indicate the patient has at least 1 cardiac stent.  Diagnostic LEFT heart catheterization performed in 2011 revealed no significant obstructive CAD  Diagnostic LEFT heart catheterization performed on 07/07/2014 revealed multivessel CAD; 10-20% mid LM, 25% LAD, 30-40% LCx and 20-30% RCA.  Intervention was deferred opting for medical management.  Diagnostic LEFT heart catheterization was performed on 02/22/2016 revealing multivessel CAD; 25% LM and 25% proximal LCx.  Again, given the nonobstructive nature of the noted coronary artery disease, intervention was deferred opting for medical management.  Most recent myocardial perfusion imaging study was performed on 09/01/2019 revealing a normal left ventricular systolic function with an EF of 55-65%.  There was no evidence of stress-induced myocardial ischemia or arrhythmia; no scintigraphic evidence of scar.  Study determined to be normal and low risk.  Most recent  TTE was performed on 10/21/2022 revealing a normal left ventricular systolic function with an EF of 60 to 65%.  There were no regional wall  motion abnormalities.  There was mild LVH.  Right ventricular size and function normal.  There was mild mitral valve regurgitation.  All transvalvular gradients were noted to be normal providing no evidence suggestive of valvular stenosis.  Aorta normal in size with no evidence of aneurysmal dilatation.  Coronary CTA was performed on 10/28/2022 revealing an elevated coronary calcium score of 1092.  This was in the 99th percentile for age, sex, and race matched control.  Patient with mild (25-49%) stenosis in the mid LM, proximal LAD, and proximal LCx territories.  Additionally there was minimal (< 25%) stenosis in the RCA territory.  Preventative therapy and risk factor modification was recommended.  Blood pressure reasonably controlled at 136/72 mmHg on currently prescribed CCB (amlodipine), ACEi (lisinopril) and beta-blocker entheses metoprolol tartrate) therapies.  Patient is on atorvastatin for her HLD diagnosis and ASCVD + omega-3 fatty acid prevention. Patient is not diabetic.  Patient does not have an OSAH diagnosis. Patient is able to complete all of her  ADL/IADLs without cardiovascular limitation.  Per the DASI, patient is able to achieve at least 4 METS of physical activity without experiencing any significant degree of angina/anginal equivalent symptoms. No changes were made to her medication regimen during her visit with cardiology.  Patient scheduled to follow-up with outpatient cardiology in 6 months or sooner if needed.  Sara Munoz is scheduled for an elective THORACIC LAMINECTOMY FOR SPINAL CORD STIMULATOR PLACEMENT ([employer]) on 03/11/2023 with Dr. Leron Croak, MD. Given patient's past medical history significant for cardiovascular diagnoses, presurgical cardiac clearance was sought by the PAT team. Per cardiology, "according to the Revised  Cardiac Risk Index (RCRI), her perioperative risk of major cardiac events is 0.4%. Therefore, based on ACC/AHA guidelines, the patient would be at ACCEPTABLE risk for the planned procedure without further cardiovascular testing".  In review of her medication reconciliation, it is noted that patient is currently on prescribed daily antithrombotic therapy. She has been instructed on recommendations for holding continuing her daily low dose ASA throughout her perioperative course.   Patient denies previous perioperative complications with anesthesia in the past. In review of the available records, it is noted that patient underwent a general anesthetic course here at [facility] in 02/2023 without documented complications.      04/18/2023    9:17 AM 04/14/2023    1:39 PM 03/17/2023    2:57 PM  Vitals with BMI  Height 5\' 4"   5\' 4"   Weight 185 lbs 8 oz 186 lbs 181 lbs  BMI 31.83  31.05  Systolic 127 147 161  Diastolic 72 81 80  Pulse 69 70     Providers/Specialists:   NOTE: Primary physician provider listed below. Patient may have been seen by APP or partner within same practice.   PROVIDER ROLE / SPECIALTY LAST OV  Lovenia Kim, MD Neurosurgery (Surgeon) 03/17/2023  Ronnald Ramp, MD Primary Care Provider 03/04/2023  Debbe Odea, MD Cardiology 11/01/2022; update call with APP 03/07/2023   Allergies:  Cyclobenzaprine, Levofloxacin, Chlorpheniramine maleate, Phenergan [promethazine hcl], Phenylephrine hcl, Toradol [ketorolac tromethamine], Zoloft [sertraline hcl], Cephalexin, Ketorolac, and Tramadol  Current Home Medications:   No current facility-administered medications for this encounter.    amLODipine (NORVASC) 5 MG tablet   aspirin EC 81 MG tablet   atorvastatin (LIPITOR) 40 MG tablet   cyclobenzaprine (FEXMID) 7.5 MG tablet   diclofenac (CATAFLAM) 50 MG tablet   docusate sodium (COLACE) 100 MG capsule    ELDERBERRY PO  gabapentin (NEURONTIN) 600 MG tablet   LINZESS 290 MCG CAPS capsule   lisinopril (ZESTRIL) 40 MG tablet   metoprolol tartrate (LOPRESSOR) 25 MG tablet   mometasone (ELOCON) 0.1 % cream   mometasone-formoterol (DULERA) 100-5 MCG/ACT AERO   Multiple Vitamins-Minerals (HAIR SKIN & NAILS PO)   naloxone (NARCAN) 4 MG/0.1ML LIQD nasal spray kit   omeprazole (PRILOSEC) 40 MG capsule   ondansetron (ZOFRAN) 8 MG tablet   oxyCODONE (ROXICODONE) 15 MG immediate release tablet   sucralfate (CARAFATE) 1 GM/10ML suspension   Tiotropium Bromide Monohydrate (SPIRIVA RESPIMAT) 2.5 MCG/ACT AERS   TURMERIC PO   VENTOLIN HFA 108 (90 Base) MCG/ACT inhaler   zolpidem (AMBIEN) 5 MG tablet   nitroGLYCERIN (NITROSTAT) 0.4 MG SL tablet   History:   Past Medical History:  Diagnosis Date   Anemia    Anxiety    Aortic atherosclerosis (HCC)    Asthma    Atypical chest pain 08/02/2015   Carotid artery disease (HCC)    Chronic back pain    Chronic nausea    Chronic, continuous use of opioids 02/27/2016   a.) has naloxone Rx available   COPD (chronic obstructive pulmonary disease) (HCC)    Coronary artery disease    a.) LHC 2011: no sig CAD (report unavailable); b.) LHC 07/07/2014: 10-20% mLM, 25% LAD, 30-40% LCx, 20-30% RCA -- med mgmt; c.) LHC 02/22/2016: 25% LM, 25% pLCx --> med mgmt; d.) MPI 07/10/2017: no ischemia; e.) MPI 09/01/2019: no ischemia; f.) cCTA 10/28/2022: Ca score 1092 (99th percentile for age/sex/race match control)   Current smoker    DDD (degenerative disc disease), cervical    a.) s/p ACDF C5-C7 and PSIF C5-T1   DDD (degenerative disc disease), lumbar 05/23/2020   a.) s/p L5-S1 PLIF   GERD (gastroesophageal reflux disease)    Hepatic steatosis    Hepatitis C virus infection cured after antiviral drug therapy    a.) s/p Tx with standard glecaprevir-pibrentasvir course   Hypercholesteremia    Hyperkalemia    Hyperlipidemia    Hypertension    Hypokalemia     Hypomagnesemia 01/04/2014   Incomplete tear of left rotator cuff    Insomnia    a.) on hypnotic PRN (zolpidem)   MI (myocardial infarction) (HCC) 2012   Migraines    MSSA (methicillin susceptible Staphylococcus aureus) septicemia (HCC) 03/03/2014   a.) TEE (-) for valvular vegitation   Nerve pain    per patient, in the lower back   Osteoarthritis    Pneumonia    PTSD (post-traumatic stress disorder)    PVD (peripheral vascular disease) (HCC)    Tendinitis of left rotator cuff    TIA (transient ischemic attack) 2014   Past Surgical History:  Procedure Laterality Date   ANTERIOR CERVICAL DECOMP/DISCECTOMY FUSION N/A 03/30/2018   Procedure: Cervical six-seven Anterior cervical decompression/discectomy/fusion;  Surgeon: Jadene Pierini, MD;  Location: [facility] OR;  Service: Neurosurgery;  Laterality: N/A;   ANTERIOR CERVICAL DECOMP/DISCECTOMY FUSION N/A 01/11/2019   Procedure: Cervical Five-Six Anterior cervical discectomy fusion,  Cervical Five to Cervical Seven anterior instrumented fusion;  Surgeon: Jadene Pierini, MD;  Location: [facility] OR;  Service: Neurosurgery;  Laterality: N/A;  Cervical Five-Six Anterior cervical discectomy fusion,  Cervical Five to Cervical Seven anterior instrumented fusion   CARDIAC CATHETERIZATION Left 02/22/2016   Procedure: Left Heart Cath and Coronary Angiography;  Surgeon: Alwyn Pea, MD;  Location: [facility] INVASIVE CV LAB;  Service: Cardiovascular;  Laterality: Left;   CATARACT EXTRACTION W/ INTRAOCULAR LENS  IMPLANT Right    COLONOSCOPY WITH PROPOFOL N/A 09/19/2016   Procedure: COLONOSCOPY WITH PROPOFOL;  Surgeon: [name], MD;  Location: [facility] ENDOSCOPY;  Service: Endoscopy;  Laterality: N/A;   DIAGNOSTIC LAPAROSCOPY     ESOPHAGOGASTRODUODENOSCOPY (EGD) WITH PROPOFOL N/A 09/18/2017   Procedure: ESOPHAGOGASTRODUODENOSCOPY (EGD) WITH PROPOFOL;  Surgeon: [name], MD;  Location: [facility] ENDOSCOPY;  Service: Gastroenterology;  Laterality: N/A;    ESOPHAGOGASTRODUODENOSCOPY (EGD) WITH PROPOFOL N/A 08/10/2019   Procedure: ESOPHAGOGASTRODUODENOSCOPY (EGD) WITH PROPOFOL;  Surgeon: [name], MD;  Location: [facility] ENDOSCOPY;  Service: Endoscopy;  Laterality: N/A;   HEMORRHOID SURGERY     LEFT HEART CATH AND CORONARY ANGIOGRAPHY Left 07/07/2014   LEFT HEART CATH AND CORONARY ANGIOGRAPHY Left 2011   LUMBAR WOUND DEBRIDEMENT N/A 11/05/2022   Procedure: Thoracic one spinous process removal and wound revision;  Surgeon: Jadene Pierini, MD;  Location: [facility];  Service: Neurosurgery;  Laterality: N/A;   NASAL SINUS SURGERY     ORIF FEMUR FRACTURE Left    ORIF TIBIA & FIBULA FRACTURES Right    OVARIAN CYST SURGERY     POSTERIOR CERVICAL FUSION/FORAMINOTOMY N/A 05/14/2021   Procedure: Cervical five, Cervical six Laminectomy,foraminotomy with Cervical five-six Cervical six-seven Cervical seven-Thoracic one Posterior instrumented fusion;  Surgeon: Jadene Pierini, MD;  Location: [facility] OR;  Service: Neurosurgery;  Laterality: N/A;   SHOULDER ARTHROSCOPY WITH SUBACROMIAL DECOMPRESSION, ROTATOR CUFF REPAIR AND BICEP TENDON REPAIR Left 05/28/2022   Procedure: SHOULDER ARTHROSCOPY WITH DEBRIDEMENT, DECOMPRESSION, ROTATOR CUFF REPAIR AND BICEP TENDON REPAIR;  Surgeon: Christena Flake, MD;  Location: [facility] ORS;  Service: Orthopedics;  Laterality: Left;   SHOULDER ARTHROSCOPY WITH SUBACROMIAL DECOMPRESSION, ROTATOR CUFF REPAIR AND BICEP TENDON REPAIR Left 12/03/2022   Procedure: SHOULDER Extensive ARTHROSCOPY WITH DEBRIDEMENT, removal of Retained Foreign Body;  Surgeon: Christena Flake, MD;  Location: [facility] ORS;  Service: Orthopedics;  Laterality: Left;   SHOULDER ARTHROSCOPY WITH SUBACROMIAL DECOMPRESSION, ROTATOR CUFF REPAIR AND BICEP TENDON REPAIR Right 03/11/2023   Procedure: RIGHT SHOULDER ARTHROSCOPY WITH DEBRIDMENT, DECOMPRESSION and BICEPS TENODESIS.;  Surgeon: Christena Flake, MD;  Location: [facility] ORS;  Service: Orthopedics;  Laterality: Right;    TRANSFORAMINAL LUMBAR INTERBODY FUSION W/ MIS 1 LEVEL Right 05/23/2020   Procedure: Right Lumbar Five Sacral One Minimally invasive transforaminal lumbar interbody fusion;  Surgeon: Jadene Pierini, MD;  Location: [facility] OR;  Service: Neurosurgery;  Laterality: Right;  Right Lumbar Five Sacral One Minimally invasive transforaminal lumbar interbody fusion   Family History  Problem Relation Age of Onset   Heart disease Mother    Lung cancer Mother    Ovarian cancer Mother    Healthy Brother    Healthy Brother    Healthy Brother    Diabetes Maternal Uncle    Breast cancer Maternal Aunt    Social History   Tobacco Use   Smoking status: Every Day    Current packs/day: 0.50    Average packs/day: 0.5 packs/day for 35.0 years (17.5 ttl pk-yrs)    Types: Cigarettes   Smokeless tobacco: Never  Vaping Use   Vaping status: Former   Quit date: 06/24/2013  Substance Use Topics   Alcohol use: Not Currently    Alcohol/week: 0.0 standard drinks of alcohol   Drug use: Not Currently    Pertinent Clinical Results:  LABS:   Hospital Outpatient Visit on 04/18/2023  Component Date Value Ref Range Status   MRSA, PCR 04/18/2023 NEGATIVE  NEGATIVE Final   Staphylococcus aureus 04/18/2023 NEGATIVE  NEGATIVE Final  Comment: (NOTE) The Xpert SA Assay (FDA approved for NASAL specimens in patients 14 years of age and older), is one component of a comprehensive surveillance program. It is not intended to diagnose infection nor to guide or monitor treatment. Performed at [facility], [address] [place], [place] [address]    Color, Urine 04/18/2023 YELLOW (A)  YELLOW Final   APPearance 04/18/2023 CLEAR (A)  CLEAR Final   Specific Gravity, Urine 04/18/2023 1.014  1.005 - 1.030 Final   pH 04/18/2023 5.0  5.0 - 8.0 Final   Glucose, UA 04/18/2023 NEGATIVE  NEGATIVE mg/dL Final   Hgb urine dipstick 04/18/2023 NEGATIVE  NEGATIVE Final   Bilirubin Urine 04/18/2023 NEGATIVE  NEGATIVE Final    Ketones, ur 04/18/2023 NEGATIVE  NEGATIVE mg/dL Final   Protein, ur 60/45/4098 NEGATIVE  NEGATIVE mg/dL Final   Nitrite 11/91/4782 NEGATIVE  NEGATIVE Final   Leukocytes,Ua 04/18/2023 NEGATIVE  NEGATIVE Final   Performed at [facility], [address] [place], [place] [address]   Sodium 04/18/2023 138  135 - 145 mmol/L Final   Potassium 04/18/2023 4.2  3.5 - 5.1 mmol/L Final   Chloride 04/18/2023 104  98 - 111 mmol/L Final   CO2 04/18/2023 27  22 - 32 mmol/L Final   Glucose, Bld 04/18/2023 98  70 - 99 mg/dL Final   Glucose reference range applies only to samples taken after fasting for at least 8 hours.   BUN 04/18/2023 15  6 - 20 mg/dL Final   Creatinine, Ser 04/18/2023 0.77  0.44 - 1.00 mg/dL Final   Calcium 30/86/5784 9.0  8.9 - 10.3 mg/dL Final   GFR, Estimated 04/18/2023 >60  >60 mL/min Final   Comment: (NOTE) Calculated using the CKD-EPI Creatinine Equation (2021)    Anion gap 04/18/2023 7  5 - 15 Final   Performed at [facility], [address] [place], [place] [address]   ABO/RH(D) 04/18/2023 O POS   Final   Antibody Screen 04/18/2023 NEG   Final   Sample Expiration 04/18/2023 05/02/2023,2359   Final   Extend sample reason 04/18/2023    Final                   Value:NO TRANSFUSIONS OR PREGNANCY IN THE PAST 3 MONTHS Performed at [facility], [address] [place], [place] [address]   Orders Only on 04/17/2023  Component Date Value Ref Range Status   Vitamin B-12 04/17/2023 532  232 - 1,245 pg/mL Final   Folate 04/17/2023 12.9  >3.0 ng/mL Final   Comment: A serum folate concentration of less than 3.1 ng/mL is considered to represent clinical deficiency.    WBC 04/17/2023 7.1  3.4 - 10.8 x10E3/uL Final   RBC 04/17/2023 4.96  3.77 - 5.28 x10E6/uL Final   Hemoglobin 04/17/2023 11.3  11.1 - 15.9 g/dL Final   Hematocrit 32/44/0102 37.6  34.0 - 46.6 % Final   MCV 04/17/2023 76 (L)  79 - 97 fL Final   MCH 04/17/2023 22.8 (L)  26.6 - 33.0 pg  Final   MCHC 04/17/2023 30.1 (L)  31.5 - 35.7 g/dL Final   RDW 72/53/6644 18.6 (H)  11.7 - 15.4 % Final   Platelets 04/17/2023 278  150 - 450 x10E3/uL Final   Neutrophils 04/17/2023 52  Not Estab. % Final   Lymphs 04/17/2023 37  Not Estab. % Final   Monocytes 04/17/2023 7  Not Estab. % Final   Eos 04/17/2023 3  Not Estab. %  Final   Basos 04/17/2023 1  Not Estab. % Final   Neutrophils Absolute 04/17/2023 3.7  1.4 - 7.0 x10E3/uL Final   Lymphocytes Absolute 04/17/2023 2.6  0.7 - 3.1 x10E3/uL Final   Monocytes Absolute 04/17/2023 0.5  0.1 - 0.9 x10E3/uL Final   EOS (ABSOLUTE) 04/17/2023 0.2  0.0 - 0.4 x10E3/uL Final   Basophils Absolute 04/17/2023 0.1  0.0 - 0.2 x10E3/uL Final   Immature Granulocytes 04/17/2023 0  Not Estab. % Final   Immature Grans (Abs) 04/17/2023 0.0  0.0 - 0.1 x10E3/uL Final   Total Iron Binding Capacity 04/17/2023 417  250 - 450 ug/dL Final   UIBC 16/03/9603 373  131 - 425 ug/dL Final   Iron 54/02/8118 44  27 - 159 ug/dL Final   Iron Saturation 04/17/2023 11 (L)  15 - 55 % Final   Ferritin 04/17/2023 11 (L)  15 - 150 ng/mL Final   Antigliadin Abs, IgA 04/17/2023 WILL FOLLOW   Preliminary   Transglutaminase IgA 04/17/2023 WILL FOLLOW   Preliminary   IgA/Immunoglobulin A, Serum 04/17/2023 225  87 - 352 mg/dL Final    ECG: Date: 14/78/2956 Time ECG obtained: 0820 AM Rate: 63 bpm Rhythm: normal sinus Axis (leads I and aVF): Normal Intervals: PR 196 ms. QRS 80 ms. QTc 425 ms. ST segment and T wave changes: No evidence of acute ST segment elevation or depression Comparison: Similar to previous tracing obtained on 07/19/2022   IMAGING / PROCEDURES: MR THORACIC SPINE WO CONTRAST performed on 03/25/2023 T8-9: Large bilobed disc herniation, more prominent to the right of midline. Effacement of the subarachnoid space. Indentation of the ventral cord, particularly on the right. Mild bilateral foraminal narrowing. T9-10: Right paracentral disc herniation effaces the  ventral subarachnoid space but does not affect the cord. Mild right foraminal narrowing T7-8: Shallow disc protrusion narrows the ventral subarachnoid space but does not compress the cord T6-7: Disc bulge more prominent towards the left. No stenosis.  MR SHOULDER RIGHT WO CONTRAST performed on 02/21/2023 Moderate tendinosis of the supraspinatus tendon with a small insertional interstitial tear Moderate tendinosis of the infraspinatus tendon. Moderate tendinosis of the intra-articular portion of the long head of the biceps tendon.   CT CORONARY MORPH W/CTA COR W/SCORE W/CA W/CM &/OR WO/CM performed on 10/28/2022 Coronary calcium score of 1092. This was 99th percentile for age and sex matched control. Normal coronary origin with left dominance. Mild LM, proximal LAD and LCx stenosis (25-49%). Minimal RCA stenosis (<25%). CAD-RADS 2. Mild non-obstructive CAD (25-49%). Consider non-atherosclerotic causes of chest pain. Consider preventive therapy and risk factor modification. Aortic atherosclerosis  VAS [place] ABI WITH/WO TBI performed on 10/21/2022 Resting right ankle-brachial index is within normal range. The right toe-brachial index is normal.  Resting left ankle-brachial index is within normal range. The left toe-brachial index is normal.   TRANSTHORACIC ECHOCARDIOGRAM performed of 10/21/2022 Left ventricular ejection fraction, by estimation, is 60 to 65%. The left ventricle has normal function. The left ventricle has no regional  wall motion abnormalities. There is mild left ventricular hypertrophy. Left ventricular diastolic parameters were normal.  Right ventricular systolic function is normal. The right ventricular size is normal.  The mitral valve is normal in structure. Mild mitral valve regurgitation.  The aortic valve was not well visualized. Aortic valve regurgitation is not visualized. No aortic stenosis is present.  The inferior vena cava is normal in size with greater than 50%  respiratory variability, suggesting right atrial pressure of 3 mmHg.   MR LUMBAR SPINE  WO CONTRAST performed on 09/16/2022 At L5-S1 there is posterior lumbar interbody fusion. Mild-moderate right foraminal stenosis. No left foraminal stenosis. No spinal stenosis. At L4-5 there is a broad-based disc bulge. Mild bilateral facet arthropathy. Mild spinal stenosis. Bilateral lateral recess stenosis. Mild bilateral foraminal stenosis. No acute osseous injury of the lumbar spine.  MR SHOULDER LEFT WO CONTRAST performed on 05/11/2022 Minimal bursal sided degenerative fraying of the anterior supraspinatus tendon footprint. Mild-to-moderate infraspinatus tendinosis. 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 No full-thickness tendon tear or tendon retraction. Mild degenerative changes of the acromioclavicular joint. Mild subacromial/subdeltoid bursitis.   MYOCARDIAL PERFUSION IMAGING STUDY (LEXISCAN) performed on 09/01/2019 Normal left ventricular systolic function with a normal LVEF of 55-65% Normal myocardial thickening and wall motion Left ventricular cavity size normal SPECT images demonstrate homogenous tracer distribution throughout the myocardium No evidence of stress-induced myocardial ischemia or arrhythmia Normal low risk study  Impression and Plan:  Sara Munoz has been referred for pre-anesthesia review and clearance prior to her undergoing the planned anesthetic and procedural courses. Available labs, pertinent testing, and imaging results were personally reviewed by me in preparation for upcoming operative/procedural course. [facility] medical record has been updated following extensive record review and patient interview with PAT staff.   This patient has been appropriately cleared by cardiology with an overall ACCEPTABLE risk of experiencing significant perioperative cardiovascular complications. Based on  clinical review performed today (04/18/23), barring any significant acute changes in the patient's overall condition, it is anticipated that she will be able to proceed with the planned surgical intervention. Any acute changes in clinical condition may necessitate her procedure being postponed and/or cancelled. Patient will meet with anesthesia team (MD and/or CRNA) on the day of her procedure for preoperative evaluation/assessment. Questions regarding anesthetic course will be fielded at that time.   Pre-surgical instructions were reviewed with the patient during her PAT appointment, and questions were fielded to satisfaction by PAT clinical staff. She has been instructed on which medications that she will need to hold prior to surgery, as well as the ones that have been deemed safe/appropriate to take on the day of her procedure. As part of the general education provided by PAT, patient made aware both verbally and in writing, that she would need to abstain from the use of any illegal substances during her perioperative course.  She was advised that failure to follow the provided instructions could necessitate case cancellation or result in serious perioperative complications up to and including death. Patient encouraged to contact PAT and/or her surgeon's office to discuss any questions or concerns that may arise prior to surgery; verbalized understanding.   Quentin Mulling, MSN, APRN, FNP-C, CEN [facility]  Peri-operative Services Nurse Practitioner Phone: [phone] Fax: [phone] 04/18/23 12:26 PM  NOTE: This note has been prepared using Dragon dictation software. Despite my best ability to proofread, there is always the potential that unintentional transcriptional errors may still occur from this process.

## 2023-04-18 NOTE — Patient Instructions (Addendum)
Your procedure is scheduled [id number] 05/01/23  Report to the Registration Desk on the 1st floor of the [facility]. To find out your arrival time, please call [phone] between 1PM - 3PM on: Wednesday 04/30/23  If your arrival time is 6:00 am, do not arrive before that time as the [facility] entrance doors do not open until 6:00 am.  REMEMBER: Instructions that are not followed completely may result in serious medical risk, up to and including death; or upon the discretion of your surgeon and anesthesiologist your surgery may need to be rescheduled.  Do not eat food after midnight the night before surgery.  No gum chewing or hard candies.  You may however, drink CLEAR liquids up to 2 hours before you are scheduled to arrive for your surgery. Do not drink anything within 2 hours of your scheduled arrival time.  Clear liquids include: - water  - apple juice without pulp - gatorade (not RED colors) - black coffee or tea (Do NOT add milk or creamers to the coffee or tea) Do NOT drink anything that is not on this list.   In addition, your doctor has ordered for you to drink the provided:  Ensure Pre-Surgery Clear Carbohydrate Drink  Gatorade G2 Drinking this carbohydrate drink up to two hours before surgery helps to reduce insulin resistance and improve patient outcomes. Please complete drinking 2 hours before scheduled arrival time.  One week prior to surgery: Stop Anti-inflammatories (NSAIDS) such as Advil, Aleve, Ibuprofen, Motrin, diclofenac (CATAFLAM) Naproxen, Naprosyn and Aspirin based products such as Excedrin, Goody's Powder, BC Powder. Stop ANY OVER THE COUNTER supplements until after surgery. ELDERBERRY  TURMERIC PO   You may however, continue to take Tylenol if needed for pain up until the day of surgery.   Continue taking all of your other prescription medications up until the day of surgery.  ON THE DAY OF SURGERY ONLY TAKE THESE MEDICATIONS WITH SIPS OF  WATER:  gabapentin (NEURONTIN) 600 MG  metoprolol tartrate (LOPRESSOR) 25 MG  omeprazole (PRILOSEC) 40 MG  oxyCODONE (ROXICODONE) 15 MG (if needed)  Use inhalers on the day of surgery and bring to the hospital. VENTOLIN HFA 108 (90 Base) MCG/ACT inhaler  mometasone-formoterol (DULERA) 100-5 MCG/ACT AERO   No Alcohol for 24 hours before or after surgery.  No Smoking including e-cigarettes for 24 hours before surgery.  No chewable tobacco products for at least 6 hours before surgery.  No nicotine patches on the day of surgery.  Do not use any "recreational" drugs for at least a week (preferably 2 weeks) before your surgery.  Please be advised that the combination of cocaine and anesthesia may have negative outcomes, up to and including death. If you test positive for cocaine, your surgery will be cancelled.  On the morning of surgery brush your teeth with toothpaste and water, you may rinse your mouth with mouthwash if you wish. Do not swallow any toothpaste or mouthwash.  Use CHG Soap or wipes as directed on instruction sheet.  Do not wear jewelry, make-up, hairpins, clips or nail polish.  For welded (permanent) jewelry: bracelets, anklets, waist bands, etc.  Please have this removed prior to surgery.  If it is not removed, there is a chance that hospital personnel will need to cut it off on the day of surgery.  Do not wear lotions, powders, or perfumes.   Do not shave body hair from the neck down 48 hours before surgery.  Contact lenses, hearing aids and dentures may  not be worn into surgery.  Do not bring valuables to the hospital. [facility] is not responsible for any missing/lost belongings or valuables.   Total Shoulder Arthroplasty:  use Benzoyl Peroxide 5% Gel as directed on instruction sheet.  Bring your C-PAP to the hospital in case you may have to spend the night.   Notify your doctor if there is any change in your medical condition (cold, fever,  infection).  Wear comfortable clothing (specific to your surgery type) to the hospital.  After surgery, you can help prevent lung complications by doing breathing exercises.  Take deep breaths and cough every 1-2 hours. Your doctor may order a device called an Incentive Spirometer to help you take deep breaths. When coughing or sneezing, hold a pillow firmly against your incision with both hands. This is called "splinting." Doing this helps protect your incision. It also decreases belly discomfort.  If you are being admitted to the hospital overnight, leave your suitcase in the car. After surgery it may be brought to your room.  In case of increased patient census, it may be necessary for you, the patient, to continue your postoperative care in the Same Day Surgery department.  If you are being discharged the day of surgery, you will not be allowed to drive home. You will need a responsible individual to drive you home and stay with you for 24 hours after surgery.   If you are taking public transportation, you will need to have a responsible individual with you.  Please call the Pre-admissions Testing Dept. at [phone] if you have any questions about these instructions.  Surgery Visitation Policy:  Patients having surgery or a procedure may have two visitors.  Children under the age of 17 must have an adult with them who is not the patient.  Inpatient Visitation:    Visiting hours are 7 a.m. to 8 p.m. Up to four visitors are allowed at one time in a patient room. The visitors may rotate out with other people during the day.  One visitor age 59 or older may stay with the patient overnight and must be in the room by 8 p.m.      Pre-operative 5 CHG Bath Instructions   You can play a key role in reducing the risk of infection after surgery. Your skin needs to be as free of germs as possible. You can reduce the number of germs on your skin by washing with CHG (chlorhexidine  gluconate) soap before surgery. CHG is an antiseptic soap that kills germs and continues to kill germs even after washing.   DO NOT use if you have an allergy to chlorhexidine/CHG or antibacterial soaps. If your skin becomes reddened or irritated, stop using the CHG and notify one of our RNs at [phone].   Please shower with the CHG soap starting 4 days before surgery using the following schedule:     Please keep in mind the following:  DO NOT shave, including legs and underarms, starting the day of your first shower.   You may shave your face at any point before/day of surgery.  Place clean sheets on your bed the day you start using CHG soap. Use a clean washcloth (not used since being washed) for each shower. DO NOT sleep with pets once you start using the CHG.   CHG Shower Instructions:  If you choose to wash your hair and private area, wash first with your normal shampoo/soap.  After you use shampoo/soap, rinse your hair and  body thoroughly to remove shampoo/soap residue.  Turn the water OFF and apply about 3 tablespoons (45 ml) of CHG soap to a CLEAN washcloth.  Apply CHG soap ONLY FROM YOUR NECK DOWN TO YOUR TOES (washing for 3-5 minutes)  DO NOT use CHG soap on face, private areas, open wounds, or sores.  Pay special attention to the area where your surgery is being performed.  If you are having back surgery, having someone wash your back for you may be helpful. Wait 2 minutes after CHG soap is applied, then you may rinse off the CHG soap.  Pat dry with a clean towel  Put on clean clothes/pajamas   If you choose to wear lotion, please use ONLY the CHG-compatible lotions on the back of this paper.     Additional instructions for the day of surgery: DO NOT APPLY any lotions, deodorants, cologne, or perfumes.   Put on clean/comfortable clothes.  Brush your teeth.  Ask your nurse before applying any prescription medications to the skin.      CHG Compatible Lotions    Aveeno Moisturizing lotion  Cetaphil Moisturizing Cream  Cetaphil Moisturizing Lotion  Clairol Herbal Essence Moisturizing Lotion, Dry Skin  Clairol Herbal Essence Moisturizing Lotion, Extra Dry Skin  Clairol Herbal Essence Moisturizing Lotion, Normal Skin  Curel Age Defying Therapeutic Moisturizing Lotion with Alpha Hydroxy  Curel Extreme Care Body Lotion  Curel Soothing Hands Moisturizing Hand Lotion  Curel Therapeutic Moisturizing Cream, Fragrance-Free  Curel Therapeutic Moisturizing Lotion, Fragrance-Free  Curel Therapeutic Moisturizing Lotion, Original Formula  Eucerin Daily Replenishing Lotion  Eucerin Dry Skin Therapy Plus Alpha Hydroxy Crme  Eucerin Dry Skin Therapy Plus Alpha Hydroxy Lotion  Eucerin Original Crme  Eucerin Original Lotion  Eucerin Plus Crme Eucerin Plus Lotion  Eucerin TriLipid Replenishing Lotion  Keri Anti-Bacterial Hand Lotion  Keri Deep Conditioning Original Lotion Dry Skin Formula Softly Scented  Keri Deep Conditioning Original Lotion, Fragrance Free Sensitive Skin Formula  Keri Lotion Fast Absorbing Fragrance Free Sensitive Skin Formula  Keri Lotion Fast Absorbing Softly Scented Dry Skin Formula  Keri Original Lotion  Keri Skin Renewal Lotion Keri Silky Smooth Lotion  Keri Silky Smooth Sensitive Skin Lotion  Nivea Body Creamy Conditioning Oil  Nivea Body Extra Enriched Teacher, adult education Moisturizing Lotion Nivea Crme  Nivea Skin Firming Lotion  NutraDerm 30 Skin Lotion  NutraDerm Skin Lotion  NutraDerm Therapeutic Skin Cream  NutraDerm Therapeutic Skin Lotion  ProShield Protective Hand Cream  Provon moisturizing lotion

## 2023-04-19 LAB — CBC WITH DIFFERENTIAL/PLATELET
Basophils Absolute: 0.1 10*3/uL (ref 0.0–0.2)
Basos: 1 %
EOS (ABSOLUTE): 0.2 10*3/uL (ref 0.0–0.4)
Eos: 3 %
Hematocrit: 37.6 % (ref 34.0–46.6)
Hemoglobin: 11.3 g/dL (ref 11.1–15.9)
Immature Grans (Abs): 0 10*3/uL (ref 0.0–0.1)
Immature Granulocytes: 0 %
Lymphocytes Absolute: 2.6 10*3/uL (ref 0.7–3.1)
Lymphs: 37 %
MCH: 22.8 pg — ABNORMAL LOW (ref 26.6–33.0)
MCHC: 30.1 g/dL — ABNORMAL LOW (ref 31.5–35.7)
MCV: 76 fL — ABNORMAL LOW (ref 79–97)
Monocytes Absolute: 0.5 10*3/uL (ref 0.1–0.9)
Monocytes: 7 %
Neutrophils Absolute: 3.7 10*3/uL (ref 1.4–7.0)
Neutrophils: 52 %
Platelets: 278 10*3/uL (ref 150–450)
RBC: 4.96 x10E6/uL (ref 3.77–5.28)
RDW: 18.6 % — ABNORMAL HIGH (ref 11.7–15.4)
WBC: 7.1 10*3/uL (ref 3.4–10.8)

## 2023-04-19 LAB — B12 AND FOLATE PANEL
Folate: 12.9 ng/mL (ref >3.0)
Vitamin B-12: 532 pg/mL (ref 232–1245)

## 2023-04-19 LAB — CELIAC DISEASE AB SCREEN W/RFX
Antigliadin Abs, IgA: 4 U (ref 0–19)
IgA/Immunoglobulin A, Serum: 225 mg/dL (ref 87–352)
Transglutaminase IgA: <2 U/mL (ref 0–3)

## 2023-04-19 LAB — IRON,TIBC AND FERRITIN PANEL
Ferritin: 11 ng/mL — ABNORMAL LOW (ref 15–150)
Iron Saturation: 11 % — ABNORMAL LOW (ref 15–55)
Iron: 44 ug/dL (ref 27–159)
Total Iron Binding Capacity: 417 ug/dL (ref 250–450)
UIBC: 373 ug/dL (ref 131–425)

## 2023-04-21 ENCOUNTER — Ambulatory Visit: Payer: 59 | Admitting: Certified Registered Nurse Anesthetist

## 2023-04-21 ENCOUNTER — Ambulatory Visit
Admission: RE | Admit: 2023-04-21 | Discharge: 2023-04-21 | Disposition: A | Discharge status: Home / Self Care | Payer: 59 | Attending: Gastroenterology | Admitting: Gastroenterology

## 2023-04-21 ENCOUNTER — Encounter
Admission: RE | Disposition: A | Discharge status: Home / Self Care | Payer: Self-pay | Source: Home / Self Care | Attending: Gastroenterology

## 2023-04-21 ENCOUNTER — Other Ambulatory Visit: Payer: Self-pay

## 2023-04-21 ENCOUNTER — Telehealth: Payer: Self-pay

## 2023-04-21 ENCOUNTER — Encounter: Payer: Self-pay | Admitting: Gastroenterology

## 2023-04-21 DIAGNOSIS — Z791 Long term (current) use of non-steroidal anti-inflammatories (NSAID): Secondary | ICD-10-CM

## 2023-04-21 DIAGNOSIS — I2510 Atherosclerotic heart disease of native coronary artery without angina pectoris: Secondary | ICD-10-CM | POA: Insufficient documentation

## 2023-04-21 DIAGNOSIS — K3189 Other diseases of stomach and duodenum: Secondary | ICD-10-CM | POA: Diagnosis not present

## 2023-04-21 DIAGNOSIS — Z8673 Personal history of transient ischemic attack (TIA), and cerebral infarction without residual deficits: Secondary | ICD-10-CM | POA: Insufficient documentation

## 2023-04-21 DIAGNOSIS — K5900 Constipation, unspecified: Secondary | ICD-10-CM

## 2023-04-21 DIAGNOSIS — F4310 Post-traumatic stress disorder, unspecified: Secondary | ICD-10-CM | POA: Diagnosis not present

## 2023-04-21 DIAGNOSIS — E7800 Pure hypercholesterolemia, unspecified: Secondary | ICD-10-CM | POA: Insufficient documentation

## 2023-04-21 DIAGNOSIS — I739 Peripheral vascular disease, unspecified: Secondary | ICD-10-CM | POA: Diagnosis not present

## 2023-04-21 DIAGNOSIS — F17210 Nicotine dependence, cigarettes, uncomplicated: Secondary | ICD-10-CM | POA: Insufficient documentation

## 2023-04-21 DIAGNOSIS — K219 Gastro-esophageal reflux disease without esophagitis: Secondary | ICD-10-CM | POA: Insufficient documentation

## 2023-04-21 DIAGNOSIS — R109 Unspecified abdominal pain: Secondary | ICD-10-CM

## 2023-04-21 DIAGNOSIS — I252 Old myocardial infarction: Secondary | ICD-10-CM | POA: Insufficient documentation

## 2023-04-21 DIAGNOSIS — J4489 Other specified chronic obstructive pulmonary disease: Secondary | ICD-10-CM | POA: Insufficient documentation

## 2023-04-21 DIAGNOSIS — Z79899 Other long term (current) drug therapy: Secondary | ICD-10-CM | POA: Diagnosis not present

## 2023-04-21 DIAGNOSIS — F419 Anxiety disorder, unspecified: Secondary | ICD-10-CM | POA: Diagnosis not present

## 2023-04-21 DIAGNOSIS — D509 Iron deficiency anemia, unspecified: Secondary | ICD-10-CM

## 2023-04-21 DIAGNOSIS — K2970 Gastritis, unspecified, without bleeding: Secondary | ICD-10-CM

## 2023-04-21 DIAGNOSIS — I10 Essential (primary) hypertension: Secondary | ICD-10-CM | POA: Diagnosis not present

## 2023-04-21 HISTORY — PX: ESOPHAGOGASTRODUODENOSCOPY (EGD) WITH PROPOFOL: SHX5813

## 2023-04-21 HISTORY — PX: BIOPSY: SHX5522

## 2023-04-21 HISTORY — PX: COLONOSCOPY WITH PROPOFOL: SHX5780

## 2023-04-21 SURGERY — COLONOSCOPY WITH PROPOFOL
Anesthesia: General

## 2023-04-21 MED ORDER — PROPOFOL 10 MG/ML IV BOLUS
INTRAVENOUS | Status: DC | PRN
  Administered 2023-04-21: 30 mg via INTRAVENOUS
  Administered 2023-04-21: 70 mg via INTRAVENOUS
  Administered 2023-04-21: 30 mg via INTRAVENOUS

## 2023-04-21 MED ORDER — GLYCOPYRROLATE 0.2 MG/ML IJ SOLN
INTRAMUSCULAR | Status: DC | PRN
  Administered 2023-04-21: .2 mg via INTRAVENOUS

## 2023-04-21 MED ORDER — SODIUM CHLORIDE 0.9 % IV SOLN: INTRAVENOUS | Status: DC

## 2023-04-21 MED ORDER — LIDOCAINE HCL (CARDIAC) PF 100 MG/5ML IV SOSY
PREFILLED_SYRINGE | INTRAVENOUS | Status: DC | PRN
  Administered 2023-04-21: 100 mg via INTRAVENOUS

## 2023-04-21 MED ORDER — PROPOFOL 500 MG/50ML IV EMUL
INTRAVENOUS | Status: DC | PRN
  Administered 2023-04-21: 160 ug/kg/min via INTRAVENOUS

## 2023-04-21 NOTE — Transfer of Care (Signed)
Immediate Anesthesia Transfer of Care Note  Patient: Sara Munoz  Procedure(s) Performed: COLONOSCOPY WITH PROPOFOL ESOPHAGOGASTRODUODENOSCOPY (EGD) WITH PROPOFOL BIOPSY  Patient Location: PACU  Anesthesia Type:General  Level of Consciousness: awake, alert , and oriented  Airway & Oxygen Therapy: Patient Spontanous Breathing and Patient connected to face mask oxygen  Post-op Assessment: Report given to RN and Post -op Vital signs reviewed and stable  Post vital signs: Reviewed and stable  Last Vitals:  Vitals Value Taken Time  BP    Temp    Pulse 75 04/21/23 1156  Resp 18 04/21/23 1156  SpO2 96 % 04/21/23 1156  Vitals shown include unfiled device data.  Last Pain:  Vitals:   04/21/23 1044  TempSrc: Temporal  PainSc: 7          Complications: No notable events documented.

## 2023-04-21 NOTE — Op Note (Signed)
[facility] Patient Name: Sara Munoz Procedure Date: 04/21/2023 11:24 AM MRN: [id number] Account #: [id number] Date of Birth: [birth year] Admit Type: Outpatient Age: 57 Room: [facility] ENDO ROOM 2 Gender: Female Note Status: Finalized Instrument Name: Colonoscope [id number] Procedure:             Colonoscopy Indications:           Last colonoscopy: March 2018, Unexplained iron                         deficiency anemia Providers:             Toney Reil MD, MD Referring MD:          No Local Md, MD (Referring MD) Medicines:             General Anesthesia Complications:         No immediate complications. Estimated blood loss: None. Procedure:             Pre-Anesthesia Assessment:                        - Prior to the procedure, a History and Physical was                         performed, and patient medications and allergies were                         reviewed. The patient is competent. The risks and                         benefits of the procedure and the sedation options and                         risks were discussed with the patient. All questions                         were answered and informed consent was obtained.                         Patient identification and proposed procedure were                         verified by the physician, the nurse, the                         anesthesiologist, the anesthetist and the technician                         in the pre-procedure area in the procedure room in the                         endoscopy suite. Mental Status Examination: alert and                         oriented. Airway Examination: normal oropharyngeal                         airway and neck mobility. Respiratory Examination:  clear to auscultation. CV Examination: normal.                         Prophylactic Antibiotics: The patient does not require                         prophylactic antibiotics. Prior  Anticoagulants: The                         patient has taken no anticoagulant or antiplatelet                         agents. ASA Grade Assessment: III - A patient with                         severe systemic disease. After reviewing the risks and                         benefits, the patient was deemed in satisfactory                         condition to undergo the procedure. The anesthesia                         plan was to use general anesthesia. Immediately prior                         to administration of medications, the patient was                         re-assessed for adequacy to receive sedatives. The                         heart rate, respiratory rate, oxygen saturations,                         blood pressure, adequacy of pulmonary ventilation, and                         response to care were monitored throughout the                         procedure. The physical status of the patient was                         re-assessed after the procedure.                        After obtaining informed consent, the colonoscope was                         passed under direct vision. Throughout the procedure,                         the patient's blood pressure, pulse, and oxygen                         saturations were monitored continuously. The  Colonoscope was introduced through the anus and                         advanced to the the cecum, identified by appendiceal                         orifice and ileocecal valve. The colonoscopy was                         performed with moderate difficulty due to inadequate                         bowel prep. The patient tolerated the procedure well.                         The quality of the bowel preparation was poor. The                         ileocecal valve, appendiceal orifice, and rectum were                         photographed. Findings:      The perianal and digital rectal examinations were normal.  Pertinent       negatives include normal sphincter tone and no palpable rectal lesions.      Copious quantities of semi-solid stool was found in the entire colon,       precluding visualization.      The retroflexed view of the distal rectum and anal verge was normal and       showed no anal or rectal abnormalities. Impression:            - Preparation of the colon was poor.                        - Stool in the entire examined colon.                        - The distal rectum and anal verge are normal on                         retroflexion view.                        - No specimens collected. Recommendation:        - Discharge patient to home (with escort).                        - Resume previous diet today.                        - Continue present medications.                        - Repeat colonoscopy at next available appointment                         (within 3 months) with Dr Tobi Bastos with 2 day prep because  the bowel preparation was suboptimal. Procedure Code(s):     --- Professional ---                        [phone], Colonoscopy, flexible; diagnostic, including                         collection of specimen(s) by brushing or washing, when                         performed (separate procedure) Diagnosis Code(s):     --- Professional ---                        D50.9, Iron deficiency anemia, unspecified CPT copyright 2022 American Medical Association. All rights reserved. The codes documented in this report are preliminary and upon coder review may  be revised to meet current compliance requirements. Dr. Libby Maw Toney Reil MD, MD 04/21/2023 11:57:49 AM This report has been signed electronically. Number of Addenda: 0 Note Initiated On: 04/21/2023 11:24 AM Scope Withdrawal Time: 0 hours 1 minute 42 seconds  Total Procedure Duration: 0 hours 4 minutes 44 seconds  Estimated Blood Loss:  Estimated blood loss: none.      [facility]

## 2023-04-21 NOTE — Telephone Encounter (Signed)
Patient was not clean out for colonoscopy on 04/21/2023 with Dr. Allegra Lai. Per colonoscopy report patient needs repeat colonoscopy with 2 day prep with Dr. Tobi Bastos within the next 3 months.

## 2023-04-21 NOTE — Op Note (Signed)
[facility]astro[facility] Patient Name: Sara Munoz Procedure Date: 04/21/2023 11:24 AM MRN: [id number] Account #: [id number] Date of Birth: 1966/06/18 Admit Type: Outpatient Age: 57 Room: [facility] ENDO ROOM 2 Gender: Female Note Status: Finalized Instrument Name: Upper Endoscope [id number] Procedure:             Upper GI endoscopy Indications:           Unexplained iron deficiency anemia Providers:             Toney Reil MD, MD Referring MD:          No Local Md, MD (Referring MD) Medicines:             General Anesthesia Complications:         No immediate complications. Estimated blood loss: None. Procedure:             Pre-Anesthesia Assessment:                        - Prior to the procedure, a History and Physical was                         performed, and patient medications and allergies were                         reviewed. The patient is competent. The risks and                         benefits of the procedure and the sedation options and                         risks were discussed with the patient. All questions                         were answered and informed consent was obtained.                         Patient identification and proposed procedure were                         verified by the physician, the nurse, the                         anesthesiologist, the anesthetist and the technician                         in the pre-procedure area in the procedure room in the                         endoscopy suite. Mental Status Examination: alert and                         oriented. Airway Examination: normal oropharyngeal                         airway and neck mobility. Respiratory Examination:                         clear to auscultation. CV Examination: normal.  Prophylactic Antibiotics: The patient does not require                         prophylactic antibiotics. Prior Anticoagulants: The                          patient has taken no anticoagulant or antiplatelet                         agents. ASA Grade Assessment: III - A patient with                         severe systemic disease. After reviewing the risks and                         benefits, the patient was deemed in satisfactory                         condition to undergo the procedure. The anesthesia                         plan was to use general anesthesia. Immediately prior                         to administration of medications, the patient was                         re-assessed for adequacy to receive sedatives. The                         heart rate, respiratory rate, oxygen saturations,                         blood pressure, adequacy of pulmonary ventilation, and                         response to care were monitored throughout the                         procedure. The physical status of the patient was                         re-assessed after the procedure.                        After obtaining informed consent, the endoscope was                         passed under direct vision. Throughout the procedure,                         the patient's blood pressure, pulse, and oxygen                         saturations were monitored continuously. The                         Endosonoscope was introduced through the mouth, and  advanced to the second part of duodenum. The upper GI                         endoscopy was accomplished without difficulty. The                         patient tolerated the procedure well. Findings:      The duodenal bulb and second portion of the duodenum were normal.       Biopsies were taken with a cold forceps for histology.      Diffuse mildly erythematous mucosa without bleeding was found in the       gastric body and in the gastric antrum. Biopsies were taken with a cold       forceps for Helicobacter pylori testing.      The cardia and gastric fundus were normal on  retroflexion.      The gastroesophageal junction and examined esophagus were normal.      Esophagogastric landmarks were identified: the gastroesophageal junction       was found at 38 cm from the incisors. Impression:            - Normal duodenal bulb and second portion of the                         duodenum. Biopsied.                        - Erythematous mucosa in the gastric body and antrum.                         Biopsied.                        - Normal gastroesophageal junction and esophagus.                        - Esophagogastric landmarks identified. Recommendation:        - Await pathology results.                        - Proceed with colonoscopy as scheduled                        See colonoscopy report Procedure Code(s):     --- Professional ---                        [phone], Esophagogastroduodenoscopy, flexible,                         transoral; with biopsy, single or multiple Diagnosis Code(s):     --- Professional ---                        D50.9, Iron deficiency anemia, unspecified                        K31.89, Other diseases of stomach and duodenum CPT copyright 2022 American Medical Association. All rights reserved. The codes documented in this report are preliminary and upon coder review may  be revised to meet current compliance requirements. Dr. Libby Maw Toney Reil MD, MD 04/21/2023 11:46:04 AM  This report has been signed electronically. Number of Addenda: 0 Note Initiated On: 04/21/2023 11:24 AM Estimated Blood Loss:  Estimated blood loss: none.      [facility]

## 2023-04-21 NOTE — Anesthesia Procedure Notes (Signed)
Date/Time: 04/21/2023 11:31 AM  Performed by: Malva Cogan, CRNAPre-anesthesia Checklist: Patient identified, Emergency Drugs available, Suction available, Patient being monitored and Timeout performed Patient Re-evaluated:Patient Re-evaluated prior to induction Oxygen Delivery Method: Simple face mask Induction Type: IV induction Placement Confirmation: CO2 detector and positive ETCO2

## 2023-04-21 NOTE — Anesthesia Preprocedure Evaluation (Signed)
Anesthesia Evaluation  Patient identified by MRN, date of birth, ID band Patient awake    Reviewed: Allergy & Precautions, NPO status , Patient's Chart, lab work & pertinent test results  History of Anesthesia Complications Negative for: history of anesthetic complications  Airway Mallampati: I   Neck ROM: Full    Dental  (+) Upper Dentures, Lower Dentures, Dental Advidsory Given   Pulmonary neg shortness of breath, asthma , neg sleep apnea, COPD, neg recent URI, Current Smoker and Patient abstained from smoking.   + rhonchi        Cardiovascular hypertension, (-) angina + CAD (s/p MI; nonobstructive CAD), + Past MI, + Cardiac Stents and + Peripheral Vascular Disease  Normal cardiovascular exam(-) dysrhythmias (-) Valvular Problems/Murmurs Rhythm:Regular Rate:Normal  ECG 03/10/23: normal  TRANSTHORACIC ECHOCARDIOGRAM performed of 10/21/2022 1. Left ventricular ejection fraction, by estimation, is 60 to 65%. The left ventricle has normal function. The left ventricle has no regional  wall motion abnormalities. There is mild left ventricular hypertrophy. Left ventricular diastolic parameters were normal.  2. Right ventricular systolic function is normal. The right ventricular size is normal.  3. The mitral valve is normal in structure. Mild mitral valve regurgitation.  4. The aortic valve was not well visualized. Aortic valve regurgitation is not visualized. No aortic stenosis is present.  5. The inferior vena cava is normal in size with greater than 50% respiratory variability, suggesting right atrial pressure of 3 mmHg.    MYOCARDIAL PERFUSION IMAGING STUDY (LEXISCAN) performed on 09/01/2019 1. Normal left ventricular systolic function with a normal LVEF of 55-65% 2. Normal myocardial thickening and wall motion 3. Left ventricular cavity size normal 4. SPECT images demonstrate homogenous tracer distribution throughout the  myocardium 5. No evidence of stress-induced myocardial ischemia or arrhythmia 6. Normal low risk study    Neuro/Psych  Headaches, neg Seizures PSYCHIATRIC DISORDERS (PTSD) Anxiety     TIA Neuromuscular disease    GI/Hepatic ,GERD  ,,(+) Hepatitis -, C  Endo/Other  neg diabetes  Obesity   Renal/GU negative Renal ROS     Musculoskeletal   Abdominal   Peds  Hematology negative hematology ROS (+)   Anesthesia Other Findings Reviewed and agree with Edd Fabian pre-anesthesia clinical review note.    Cardiology note 11/01/22:  1. Mild to nonobstructive CAD (LM, LAD, LCx), cont Lipitor 40 mg daily, continue aspirin 81.  Echo with EF 60 to 65%. 2. Hyperlipidemia, continue Lipitor 40 mg as above.  Lipid panel in 3 months. 3. Hypertension, BP controlled, continue lisinopril 20 mg daily, Lopressor, Norvasc. 4. Current smoker, smoking cessation advised.   Follow-up in 6 months.   Reproductive/Obstetrics                             Anesthesia Physical Anesthesia Plan  ASA: 3  Anesthesia Plan: General   Post-op Pain Management:    Induction: Intravenous  PONV Risk Score and Plan: 2 and Propofol infusion and TIVA  Airway Management Planned: Natural Airway and Nasal Cannula  Additional Equipment:   Intra-op Plan:   Post-operative Plan:   Informed Consent: I have reviewed the patients History and Physical, chart, labs and discussed the procedure including the risks, benefits and alternatives for the proposed anesthesia with the patient or authorized representative who has indicated his/her understanding and acceptance.     Dental advisory given  Plan Discussed with: CRNA  Anesthesia Plan Comments:  Anesthesia Quick Evaluation

## 2023-04-21 NOTE — H&P (Signed)
Sara Repress, MD [address]  [address]  [place], [place] [address]  Main: [phone]  Fax: [phone] Pager: [phone]  Primary Care Physician:  Ronnald Ramp, MD Primary Gastroenterologist:  Dr. Arlyss Munoz  Pre-Procedure History & Physical: HPI:  Sara Munoz is a 57 y.o. female is here for an endoscopy and colonoscopy.   Past Medical History:  Diagnosis Date   Anemia    Anxiety    Aortic atherosclerosis (HCC)    Asthma    Atypical chest pain 08/02/2015   Carotid artery disease (HCC)    Chronic back pain    Chronic nausea    Chronic, continuous use of opioids 02/27/2016   a.) has naloxone Rx available   COPD (chronic obstructive pulmonary disease) (HCC)    Coronary artery disease    a.) LHC 2011: no sig CAD (report unavailable); b.) LHC 07/07/2014: 10-20% mLM, 25% LAD, 30-40% LCx, 20-30% RCA -- med mgmt; c.) LHC 02/22/2016: 25% LM, 25% pLCx --> med mgmt; d.) MPI 07/10/2017: no ischemia; e.) MPI 09/01/2019: no ischemia; f.) cCTA 10/28/2022: Ca score 1092 (99th percentile for age/sex/race match control)   Current smoker    DDD (degenerative disc disease), cervical    a.) s/p ACDF C5-C7 and PSIF C5-T1   DDD (degenerative disc disease), lumbar 05/23/2020   a.) s/p L5-S1 PLIF   GERD (gastroesophageal reflux disease)    Hepatic steatosis    Hepatitis C virus infection cured after antiviral drug therapy    a.) s/p Tx with standard glecaprevir-pibrentasvir course   Hypercholesteremia    Hyperkalemia    Hyperlipidemia    Hypertension    Hypokalemia    Hypomagnesemia 01/04/2014   Incomplete tear of left rotator cuff    Insomnia    a.) on hypnotic PRN (zolpidem)   MI (myocardial infarction) (HCC) 2012   Migraines    MSSA (methicillin susceptible Staphylococcus aureus) septicemia (HCC) 03/03/2014   a.) TEE (-) for valvular vegitation   Nerve pain    per patient, in the lower back   Osteoarthritis    Pneumonia    PTSD (post-traumatic  stress disorder)    PVD (peripheral vascular disease) (HCC)    Tendinitis of left rotator cuff    TIA (transient ischemic attack) 2014    Past Surgical History:  Procedure Laterality Date   ANTERIOR CERVICAL DECOMP/DISCECTOMY FUSION N/A 03/30/2018   Procedure: Cervical six-seven Anterior cervical decompression/discectomy/fusion;  Surgeon: Jadene Pierini, MD;  Location: [facility] OR;  Service: Neurosurgery;  Laterality: N/A;   ANTERIOR CERVICAL DECOMP/DISCECTOMY FUSION N/A 01/11/2019   Procedure: Cervical Five-Six Anterior cervical discectomy fusion,  Cervical Five to Cervical Seven anterior instrumented fusion;  Surgeon: Jadene Pierini, MD;  Location: [facility] OR;  Service: Neurosurgery;  Laterality: N/A;  Cervical Five-Six Anterior cervical discectomy fusion,  Cervical Five to Cervical Seven anterior instrumented fusion   CARDIAC CATHETERIZATION Left 02/22/2016   Procedure: Left Heart Cath and Coronary Angiography;  Surgeon: Alwyn Pea, MD;  Location: [facility] INVASIVE CV LAB;  Service: Cardiovascular;  Laterality: Left;   CATARACT EXTRACTION W/ INTRAOCULAR LENS IMPLANT Right    COLONOSCOPY WITH PROPOFOL N/A 09/19/2016   Procedure: COLONOSCOPY WITH PROPOFOL;  Surgeon: [name], MD;  Location: [facility] ENDOSCOPY;  Service: Endoscopy;  Laterality: N/A;   DIAGNOSTIC LAPAROSCOPY     ESOPHAGOGASTRODUODENOSCOPY (EGD) WITH PROPOFOL N/A 09/18/2017   Procedure: ESOPHAGOGASTRODUODENOSCOPY (EGD) WITH PROPOFOL;  Surgeon: [name], MD;  Location: [facility] ENDOSCOPY;  Service: Gastroenterology;  Laterality: N/A;   ESOPHAGOGASTRODUODENOSCOPY (  EGD) WITH PROPOFOL N/A 08/10/2019   Procedure: ESOPHAGOGASTRODUODENOSCOPY (EGD) WITH PROPOFOL;  Surgeon: [name], MD;  Location: [facility] ENDOSCOPY;  Service: Endoscopy;  Laterality: N/A;   HEMORRHOID SURGERY     LEFT HEART CATH AND CORONARY ANGIOGRAPHY Left 07/07/2014   LEFT HEART CATH AND CORONARY ANGIOGRAPHY Left 2011   LUMBAR WOUND DEBRIDEMENT N/A 11/05/2022    Procedure: Thoracic one spinous process removal and wound revision;  Surgeon: Jadene Pierini, MD;  Location: [facility] OR;  Service: Neurosurgery;  Laterality: N/A;   NASAL SINUS SURGERY     ORIF FEMUR FRACTURE Left    ORIF TIBIA & FIBULA FRACTURES Right    OVARIAN CYST SURGERY     POSTERIOR CERVICAL FUSION/FORAMINOTOMY N/A 05/14/2021   Procedure: Cervical five, Cervical six Laminectomy,foraminotomy with Cervical five-six Cervical six-seven Cervical seven-Thoracic one Posterior instrumented fusion;  Surgeon: Jadene Pierini, MD;  Location: [facility] OR;  Service: Neurosurgery;  Laterality: N/A;   SHOULDER ARTHROSCOPY WITH SUBACROMIAL DECOMPRESSION, ROTATOR CUFF REPAIR AND BICEP TENDON REPAIR Left 05/28/2022   Procedure: SHOULDER ARTHROSCOPY WITH DEBRIDEMENT, DECOMPRESSION, ROTATOR CUFF REPAIR AND BICEP TENDON REPAIR;  Surgeon: Christena Flake, MD;  Location: [facility] ORS;  Service: Orthopedics;  Laterality: Left;   SHOULDER ARTHROSCOPY WITH SUBACROMIAL DECOMPRESSION, ROTATOR CUFF REPAIR AND BICEP TENDON REPAIR Left 12/03/2022   Procedure: SHOULDER Extensive ARTHROSCOPY WITH DEBRIDEMENT, removal of Retained Foreign Body;  Surgeon: Christena Flake, MD;  Location: [facility] ORS;  Service: Orthopedics;  Laterality: Left;   SHOULDER ARTHROSCOPY WITH SUBACROMIAL DECOMPRESSION, ROTATOR CUFF REPAIR AND BICEP TENDON REPAIR Right 03/11/2023   Procedure: RIGHT SHOULDER ARTHROSCOPY WITH DEBRIDMENT, DECOMPRESSION and BICEPS TENODESIS.;  Surgeon: Christena Flake, MD;  Location: [facility] ORS;  Service: Orthopedics;  Laterality: Right;   TRANSFORAMINAL LUMBAR INTERBODY FUSION W/ MIS 1 LEVEL Right 05/23/2020   Procedure: Right Lumbar Five Sacral One Minimally invasive transforaminal lumbar interbody fusion;  Surgeon: Jadene Pierini, MD;  Location: [facility] OR;  Service: Neurosurgery;  Laterality: Right;  Right Lumbar Five Sacral One Minimally invasive transforaminal lumbar interbody fusion    Prior to Admission medications   Medication Sig  Start Date End Date Taking? Authorizing Provider  amLODipine (NORVASC) 5 MG tablet TAKE 1 TABLET BY MOUTH EVERYDAY AT BEDTIME 03/03/23  Yes [name], [name], MD  aspirin EC 81 MG tablet Take 81 mg by mouth in the morning. Swallow whole.   Yes [provider]  atorvastatin (LIPITOR) 40 MG tablet Take 1 tablet (40 mg total) by mouth every evening. 07/19/22  Yes Agbor-Etang, Arlys John, MD  diclofenac (CATAFLAM) 50 MG tablet Take 50 mg by mouth every 4 (four) hours as needed (pain.). 01/21/23  Yes [provider]  docusate sodium (COLACE) 100 MG capsule Take 100 mg by mouth 2 (two) times daily.   Yes [provider]  ELDERBERRY PO Take 1 tablet by mouth in the morning and at bedtime.   Yes [provider]  gabapentin (NEURONTIN) 600 MG tablet Take 600 mg by mouth 4 (four) times daily.   Yes [provider]  LINZESS 290 MCG CAPS capsule Take 290 mcg by mouth in the morning. 09/06/21  Yes [provider]  lisinopril (ZESTRIL) 40 MG tablet Take 40 mg by mouth every evening.   Yes [provider]  metoprolol tartrate (LOPRESSOR) 25 MG tablet TAKE 1 TABLET BY MOUTH TWICE A DAY 03/03/23  Yes [name], [name], MD  Multiple Vitamins-Minerals (HAIR SKIN & NAILS PO) Take 1 tablet by mouth in the morning and at bedtime.  Yes [provider]  oxyCODONE (ROXICODONE) 15 MG immediate release tablet Take 15 mg by mouth every 4 (four) hours as needed for pain. 09/29/21  Yes [provider]  sucralfate (CARAFATE) 1 GM/10ML suspension Take 10 mLs (1 g total) by mouth 4 (four) times daily. 03/26/23  Yes [name], MD  Tiotropium Bromide Monohydrate (SPIRIVA RESPIMAT) 2.5 MCG/ACT AERS Take 1 puff by mouth in the morning and at bedtime. 01/23/23  Yes Jacky Kindle, FNP  TURMERIC PO Take 1 tablet by mouth in the morning and at bedtime.   Yes [provider]  VENTOLIN HFA 108 (90 Base) MCG/ACT inhaler USE 1 TO 2 INHALATIONS BY MOUTH   INTO THE LUNGS EVERY 6 HOURS AS  NEEDED FOR SHORTNESS OF BREATH  OR WHEEZING 04/03/23  Yes [name], [name], MD  zolpidem (AMBIEN) 5 MG tablet Take 1 tablet (5 mg total) by mouth at bedtime as needed for sleep. 03/04/23  Yes [name], [name], MD  cyclobenzaprine (FEXMID) 7.5 MG tablet Take 7.5 mg by mouth 3 (three) times daily as needed for muscle spasms.    [provider]  mometasone (ELOCON) 0.1 % cream Apply 1 Application topically daily as needed (hand rash). 12/03/22   Poggi, Excell Seltzer, MD  mometasone-formoterol [facility]) 100-5 MCG/ACT AERO Inhale 2 puffs into the lungs every morning. 12/03/22   Poggi, Excell Seltzer, MD  naloxone [facility]) 4 MG/0.1ML LIQD nasal spray kit Place 1 spray into the nose as needed (opioid overdose).    [provider]  nitroGLYCERIN (NITROSTAT) 0.4 MG SL tablet Place 0.4 mg under the tongue every 5 (five) minutes as needed for chest pain.    [provider]  omeprazole (PRILOSEC) 40 MG capsule Take 1 capsule (40 mg total) by mouth 2 (two) times daily. 04/14/23   [name], MD  ondansetron (ZOFRAN) 8 MG tablet Take 1 tablet (8 mg total) by mouth every 8 (eight) hours as needed for nausea or vomiting. 03/04/23   [name], Tawanna Cooler, MD    Allergies as of 04/14/2023 - Review Complete 04/14/2023  Allergen Reaction Noted   Cyclobenzaprine Hives, Swelling, and Rash 11/13/2013   Levofloxacin Hives, Swelling, and Rash 02/27/2012   Chlorpheniramine maleate  02/12/2022   Phenergan [promethazine hcl] Other (See Comments) 02/21/2017   Phenylephrine hcl Other (See Comments) 02/12/2022   Toradol [ketorolac tromethamine] Swelling and Other (See Comments) 01/17/2015   Zoloft [sertraline hcl] Swelling 05/31/2015   Cephalexin Hives and Rash 10/22/2010   Ketorolac Rash 10/22/2010   Tramadol Hives, Swelling, Other (See Comments), and Rash 01/17/2015    Family History  Problem Relation Age of Onset   Heart disease Mother    Lung cancer  Mother    Ovarian cancer Mother    Healthy Brother    Healthy Brother    Healthy Brother    Diabetes Maternal Uncle    Breast cancer Maternal Aunt     Social History   Socioeconomic History   Marital status: Widowed    Spouse name: Not on file   Number of children: 0   Years of education: Not on file   Highest education level: 9th grade  Occupational History   Occupation: disabled  Tobacco Use   Smoking status: Every Day    Current packs/day: 0.50    Average packs/day: 0.5 packs/day for 35.0 years (17.5 ttl pk-yrs)    Types: Cigarettes   Smokeless tobacco: Never  Vaping Use   Vaping status: Former   Quit date: 06/24/2013  Substance and  Sexual Activity   Alcohol use: Not Currently    Alcohol/week: 0.0 standard drinks of alcohol   Drug use: Not Currently   Sexual activity: Yes    Birth control/protection: None  Other Topics Concern   Not on file  Social History Narrative   Moved from [place].   Social Determinants of Health   Financial Resource Strain: Low Risk  (01/01/2023)   Overall Financial Resource Strain (CARDIA)    Difficulty of Paying Living Expenses: Not very hard  Food Insecurity: No Food Insecurity (10/23/2022)   Hunger Vital Sign    Worried About Running Out of Food in the Last Year: Never true    Ran Out of Food in the Last Year: Never true  Transportation Needs: No Transportation Needs (01/01/2023)   PRAPARE - Administrator, Civil Service (Medical): No    Lack of Transportation (Non-Medical): No  Physical Activity: Inactive (04/15/2023)   Exercise Vital Sign    Days of Exercise per Week: 0 days    Minutes of Exercise per Session: 0 min  Stress: Stress Concern Present (03/17/2023)   [employer] of Occupational Health - Occupational Stress Questionnaire    Feeling of Stress : To some extent  Social Connections: Socially Isolated (04/15/2023)   Social Connection and Isolation Panel [NHANES]    Frequency of Communication with Friends  and Family: More than three times a week    Frequency of Social Gatherings with Friends and Family: More than three times a week    Attends Religious Services: Never    Database administrator or Organizations: No    Attends Banker Meetings: Never    Marital Status: Widowed  Intimate Partner Violence: Not At Risk (10/23/2022)   Humiliation, Afraid, Rape, and Kick questionnaire    Fear of Current or Ex-Partner: No    Emotionally Abused: No    Physically Abused: No    Sexually Abused: No    Review of Systems: See HPI, otherwise negative ROS  Physical Exam: BP (!) 153/85   Pulse 67   Temp (!) 97.4 F (36.3 C) (Temporal)   Wt 83.5 kg   LMP 12/16/2017 (Approximate)   SpO2 99%   BMI 31.58 kg/m  General:   Alert,  pleasant and cooperative in NAD Head:  Normocephalic and atraumatic. Neck:  Supple; no masses or thyromegaly. Lungs:  Clear throughout to auscultation.    Heart:  Regular rate and rhythm. Abdomen:  Soft, nontender and nondistended. Normal bowel sounds, without guarding, and without rebound.   Neurologic:  Alert and  oriented x4;  grossly normal neurologically.  Impression/Plan: Sara Munoz is here for an endoscopy and colonoscopy to be performed for IDA  Risks, benefits, limitations, and alternatives regarding  endoscopy and colonoscopy have been reviewed with the patient.  Questions have been answered.  All parties agreeable.   Lannette Donath, MD  04/21/2023, 11:18 AM

## 2023-04-22 ENCOUNTER — Encounter: Payer: Self-pay | Admitting: Gastroenterology

## 2023-04-22 ENCOUNTER — Ambulatory Visit (INDEPENDENT_AMBULATORY_CARE_PROVIDER_SITE_OTHER): Payer: 59 | Admitting: Neurosurgery

## 2023-04-22 DIAGNOSIS — M5134 Other intervertebral disc degeneration, thoracic region: Secondary | ICD-10-CM | POA: Diagnosis not present

## 2023-04-22 DIAGNOSIS — M961 Postlaminectomy syndrome, not elsewhere classified: Secondary | ICD-10-CM | POA: Diagnosis not present

## 2023-04-22 LAB — SURGICAL PATHOLOGY

## 2023-04-22 NOTE — Progress Notes (Addendum)
I had a follow-up phone visit with Sara Munoz to discuss her MRI findings she was at home and I was in the clinic.  She gave consent to go forward with a phone visit.  We are planning for a thoracic spinal cord stimulator.  In order to do this we like to get a MRI of the thoracic spine to evaluate for any stenosis that might make this procedure unsafe for unfeasible.  2 levels of significant stenosis secondary to spondylosis and a disc extrusion at 2 contiguous levels.  Unfortunately his levels are right at the level in which we placed the paddle stimulator.  She does have some symptoms which could be c contributed to the findings.  Mostly she states that she gets midline thoracic region pain that radiates to her legs.  This worsens with coughing.  She also states that she is having worsening ambulatory issues.  Given these findings I reached out to her primary surgeon Dr. Maurice Small to discussed her care.  Once we are able to discuss a plan moving forward I would reach back out to [name].  We spent approximately 11 minutes on the phone.

## 2023-04-23 ENCOUNTER — Telehealth: Payer: Self-pay

## 2023-04-23 NOTE — Telephone Encounter (Signed)
Called patient to let her know that she is still iron deficient but that her hemoglobin was improving, so to please continue taking her oral iron medications and patent agreed and had no further questions.

## 2023-04-23 NOTE — Telephone Encounter (Signed)
-----   Message from [name] sent at 04/18/2023  8:48 AM EDT ----- Iron studies show that she is still significantly iron deficient but her hemoglobin is improving.  Continue oral iron and we will recheck in 2 months after her visit with Inetta Fermo

## 2023-04-24 ENCOUNTER — Telehealth: Payer: Self-pay

## 2023-04-24 NOTE — Telephone Encounter (Signed)
Called patient to reschedule her colonoscopy and she stated that she was not going to at this time as she was getting to have surgery on 05/01/2023. Therefore, she stated that she would give [place] a call once she was healed from her surgery and then she would deal with rescheduling her colonoscopy. I told her to not forget about [place] and she stated that she would not.

## 2023-04-24 NOTE — Telephone Encounter (Signed)
Thank you :)

## 2023-04-24 NOTE — Telephone Encounter (Signed)
-----   Message from Lovenia Kim sent at 04/24/2023  6:38 AM EDT ----- Regarding: inperson visit to discuss thoracic disks and SCS Her Surgeon that has previously done her spine surgery is leaving the area and asked that we take over for her decompressive issues as well. Please set her up for a visit, we'll need to cancel her SCS placement and will  likley need to address her stenosis before we can do a stimulator.   Thanks! Apolinar Junes

## 2023-04-24 NOTE — Telephone Encounter (Signed)
I notified the patient, the OR, [employer], and monitoring. Surgery has been canceled. The patient has been scheduled for an in person appointment with Dr Katrinka Blazing next week to discuss options.

## 2023-04-25 ENCOUNTER — Ambulatory Visit
Admission: RE | Admit: 2023-04-25 | Discharge: 2023-04-25 | Disposition: A | Discharge status: Home / Self Care | Payer: 59 | Source: Ambulatory Visit | Attending: Neurological Surgery | Admitting: Neurological Surgery

## 2023-04-25 DIAGNOSIS — M5412 Radiculopathy, cervical region: Secondary | ICD-10-CM

## 2023-04-28 NOTE — Anesthesia Postprocedure Evaluation (Signed)
Anesthesia Post Note  Patient: Sara Munoz  Procedure(s) Performed: COLONOSCOPY WITH PROPOFOL ESOPHAGOGASTRODUODENOSCOPY (EGD) WITH PROPOFOL BIOPSY  Patient location during evaluation: Endoscopy Anesthesia Type: General Level of consciousness: awake and alert Pain management: pain level controlled Vital Signs Assessment: post-procedure vital signs reviewed and stable Respiratory status: spontaneous breathing, nonlabored ventilation, respiratory function stable and patient connected to nasal cannula oxygen Cardiovascular status: blood pressure returned to baseline and stable Postop Assessment: no apparent nausea or vomiting Anesthetic complications: no   No notable events documented.   Last Vitals:  Vitals:   04/21/23 1156 04/21/23 1216  BP: 113/77 129/73  Pulse:  70  Temp: 36.8 C   SpO2:  96%    Last Pain:  Vitals:   04/22/23 0742  TempSrc:   PainSc: 0-No pain                 Lenard Simmer

## 2023-04-30 ENCOUNTER — Ambulatory Visit (INDEPENDENT_AMBULATORY_CARE_PROVIDER_SITE_OTHER): Payer: 59 | Admitting: Neurosurgery

## 2023-04-30 ENCOUNTER — Encounter: Payer: Self-pay | Admitting: Neurosurgery

## 2023-04-30 VITALS — BP 160/94 | Ht 64.0 in | Wt 185.6 lb

## 2023-04-30 DIAGNOSIS — M4712 Other spondylosis with myelopathy, cervical region: Secondary | ICD-10-CM

## 2023-04-30 DIAGNOSIS — Z79891 Long term (current) use of opiate analgesic: Secondary | ICD-10-CM

## 2023-04-30 DIAGNOSIS — Z981 Arthrodesis status: Secondary | ICD-10-CM | POA: Diagnosis not present

## 2023-04-30 DIAGNOSIS — G8929 Other chronic pain: Secondary | ICD-10-CM

## 2023-04-30 DIAGNOSIS — F17210 Nicotine dependence, cigarettes, uncomplicated: Secondary | ICD-10-CM | POA: Diagnosis not present

## 2023-04-30 DIAGNOSIS — M5104 Intervertebral disc disorders with myelopathy, thoracic region: Secondary | ICD-10-CM

## 2023-04-30 DIAGNOSIS — G992 Myelopathy in diseases classified elsewhere: Secondary | ICD-10-CM

## 2023-05-01 ENCOUNTER — Encounter: Admission: RE | Payer: Self-pay | Source: Home / Self Care

## 2023-05-01 ENCOUNTER — Ambulatory Visit: Admission: RE | Admit: 2023-05-01 | Payer: 59 | Source: Home / Self Care | Admitting: Neurosurgery

## 2023-05-01 DIAGNOSIS — G992 Myelopathy in diseases classified elsewhere: Secondary | ICD-10-CM | POA: Insufficient documentation

## 2023-05-01 HISTORY — DX: Insomnia, unspecified: G47.00

## 2023-05-01 SURGERY — THORACIC LAMINECTOMY FOR SPINAL CORD STIMULATOR
Anesthesia: General

## 2023-05-01 NOTE — Progress Notes (Addendum)
Referring Physician:  Ronnald Ramp, MD [address] [place],  [place] [address]  Primary Physician:  Ronnald Ramp, MD  History of Present Illness: 04/30/23 Sara Munoz is here today with a chief complaint of failed back syndrome.  We had her scheduled for a spinal cord stimulator, and in doing so got a screening thoracic MRI which demonstrated significant thoracic compression secondary to disc extrusions.  She has a history of cervical myelopathy for which she has had a previous cervical decompression and fusion, however on her history she states that her upper and lower extremities got better after that surgery, however now mostly her lower extremities are worsening.  She does get shooting pains from the midline back that are electric in nature that go down to her buttocks and legs.  She feels like her gait is worsening.  Feels like her strength is worsening.    I have utilized the care everywhere function in epic to review the outside records available from external health systems.  Review of Systems:  A 10 point review of systems is negative, except for the pertinent positives and negatives detailed in the HPI.  Past Medical History: Past Medical History:  Diagnosis Date   Anemia    Anxiety    Aortic atherosclerosis (HCC)    Asthma    Atypical chest pain 08/02/2015   Carotid artery disease (HCC)    Chronic back pain    Chronic nausea    Chronic, continuous use of opioids 02/27/2016   a.) has naloxone Rx available   COPD (chronic obstructive pulmonary disease) (HCC)    Coronary artery disease    a.) LHC 2011: no sig CAD (report unavailable); b.) LHC 07/07/2014: 10-20% mLM, 25% LAD, 30-40% LCx, 20-30% RCA -- med mgmt; c.) LHC 02/22/2016: 25% LM, 25% pLCx --> med mgmt; d.) MPI 07/10/2017: no ischemia; e.) MPI 09/01/2019: no ischemia; f.) cCTA 10/28/2022: Ca score 1092 (99th percentile for age/sex/race match control)   Current  smoker    DDD (degenerative disc disease), cervical    a.) s/p ACDF C5-C7 and PSIF C5-T1   DDD (degenerative disc disease), lumbar 05/23/2020   a.) s/p L5-S1 PLIF   GERD (gastroesophageal reflux disease)    Hepatic steatosis    Hepatitis C virus infection cured after antiviral drug therapy    a.) s/p Tx with standard glecaprevir-pibrentasvir course   Hypercholesteremia    Hyperkalemia    Hyperlipidemia    Hypertension    Hypokalemia    Hypomagnesemia 01/04/2014   Incomplete tear of left rotator cuff    Insomnia    a.) on hypnotic PRN (zolpidem)   MI (myocardial infarction) (HCC) 2012   Migraines    MSSA (methicillin susceptible Staphylococcus aureus) septicemia (HCC) 03/03/2014   a.) TEE (-) for valvular vegitation   Nerve pain    per patient, in the lower back   Osteoarthritis    Pneumonia    PTSD (post-traumatic stress disorder)    PVD (peripheral vascular disease) (HCC)    Tendinitis of left rotator cuff    TIA (transient ischemic attack) 2014    Past Surgical History: Past Surgical History:  Procedure Laterality Date   ANTERIOR CERVICAL DECOMP/DISCECTOMY FUSION N/A 03/30/2018   Procedure: Cervical six-seven Anterior cervical decompression/discectomy/fusion;  Surgeon: Jadene Pierini, MD;  [facility];  Service: Neurosurgery;  Laterality: N/A;   ANTERIOR CERVICAL DECOMP/DISCECTOMY FUSION N/A 01/11/2019   Procedure: Cervical Five-Six Anterior cervical discectomy fusion,  Cervical Five to  Cervical Seven anterior instrumented fusion;  Surgeon: Jadene Pierini, MD;  Location: [facility];  Service: Neurosurgery;  Laterality: N/A;  Cervical Five-Six Anterior cervical discectomy fusion,  Cervical Five to Cervical Seven anterior instrumented fusion   BIOPSY  04/21/2023   Procedure: BIOPSY;  Surgeon: Toney Reil, MD;  Location: [facility] ENDOSCOPY;  Service: Gastroenterology;;   CARDIAC CATHETERIZATION Left 02/22/2016   Procedure: Left Heart Cath and Coronary  Angiography;  Surgeon: Alwyn Pea, MD;  Location: [facility] INVASIVE CV LAB;  Service: Cardiovascular;  Laterality: Left;   CATARACT EXTRACTION W/ INTRAOCULAR LENS IMPLANT Right    COLONOSCOPY WITH PROPOFOL N/A 09/19/2016   Procedure: COLONOSCOPY WITH PROPOFOL;  Surgeon: [name], MD;  Location: [facility] ENDOSCOPY;  Service: Endoscopy;  Laterality: N/A;   COLONOSCOPY WITH PROPOFOL N/A 04/21/2023   Procedure: COLONOSCOPY WITH PROPOFOL;  Surgeon: Toney Reil, MD;  Location: [facility] ENDOSCOPY;  Service: Gastroenterology;  Laterality: N/A;   DIAGNOSTIC LAPAROSCOPY     ESOPHAGOGASTRODUODENOSCOPY (EGD) WITH PROPOFOL N/A 09/18/2017   Procedure: ESOPHAGOGASTRODUODENOSCOPY (EGD) WITH PROPOFOL;  Surgeon: [name], MD;  Location: [facility]COPY;  Service: Gastroenterology;  Laterality: N/A;   ESOPHAGOGASTRODUODENOSCOPY (EGD) WITH PROPOFOL N/A 08/10/2019   Procedure: ESOPHAGOGASTRODUODENOSCOPY (EGD) WITH PROPOFOL;  Surgeon: [name], MD;  Location: [facility] ENDOSCOPY;  Service: Endoscopy;  Laterality: N/A;   ESOPHAGOGASTRODUODENOSCOPY (EGD) WITH PROPOFOL N/A 04/21/2023   Procedure: ESOPHAGOGASTRODUODENOSCOPY (EGD) WITH PROPOFOL;  Surgeon: Toney Reil, MD;  Location: [facility] ENDOSCOPY;  Service: Gastroenterology;  Laterality: N/A;   HEMORRHOID SURGERY     LEFT HEART CATH AND CORONARY ANGIOGRAPHY Left 07/07/2014   LEFT HEART CATH AND CORONARY ANGIOGRAPHY Left 2011   LUMBAR WOUND DEBRIDEMENT N/A 11/05/2022   Procedure: Thoracic one spinous process removal and wound revision;  Surgeon: Jadene Pierini, MD;  [facility];  Service: Neurosurgery;  Laterality: N/A;   NASAL SINUS SURGERY     ORIF FEMUR FRACTURE Left    ORIF TIBIA & FIBULA FRACTURES Right    OVARIAN CYST SURGERY     POSTERIOR CERVICAL FUSION/FORAMINOTOMY N/A 05/14/2021   Procedure: Cervical five, Cervical six Laminectomy,foraminotomy with Cervical five-six Cervical six-seven Cervical seven-Thoracic one Posterior instrumented  fusion;  Surgeon: Jadene Pierini, MD;  [facility];  Service: Neurosurgery;  Laterality: N/A;   SHOULDER ARTHROSCOPY WITH SUBACROMIAL DECOMPRESSION, ROTATOR CUFF REPAIR AND BICEP TENDON REPAIR Left 05/28/2022   Procedure: SHOULDER ARTHROSCOPY WITH DEBRIDEMENT, DECOMPRESSION, ROTATOR CUFF REPAIR AND BICEP TENDON REPAIR;  Surgeon: Christena Flake, MD;  Location: [facility] ORS;  Service: Orthopedics;  Laterality: Left;   SHOULDER ARTHROSCOPY WITH SUBACROMIAL DECOMPRESSION, ROTATOR CUFF REPAIR AND BICEP TENDON REPAIR Left 12/03/2022   Procedure: SHOULDER Extensive ARTHROSCOPY WITH DEBRIDEMENT, removal of Retained Foreign Body;  Surgeon: Christena Flake, MD;  Location: [facility] ORS;  Service: Orthopedics;  Laterality: Left;   SHOULDER ARTHROSCOPY WITH SUBACROMIAL DECOMPRESSION, ROTATOR CUFF REPAIR AND BICEP TENDON REPAIR Right 03/11/2023   Procedure: RIGHT SHOULDER ARTHROSCOPY WITH DEBRIDMENT, DECOMPRESSION and BICEPS TENODESIS.;  Surgeon: Christena Flake, MD;  Location: [facility] ORS;  Service: Orthopedics;  Laterality: Right;   TRANSFORAMINAL LUMBAR INTERBODY FUSION W/ MIS 1 LEVEL Right 05/23/2020   Procedure: Right Lumbar Five Sacral One Minimally invasive transforaminal lumbar interbody fusion;  Surgeon: Jadene Pierini, MD;  [facility];  Service: Neurosurgery;  Laterality: Right;  Right Lumbar Five Sacral One Minimally invasive transforaminal lumbar interbody fusion    Allergies: Allergies as of 04/30/2023 - Review Complete 04/30/2023  Allergen Reaction Noted   Cyclobenzaprine Hives,  Swelling, and Rash 11/13/2013   Levofloxacin Hives, Swelling, and Rash 02/27/2012   Chlorpheniramine maleate  02/12/2022   Phenergan [promethazine hcl] Other (See Comments) 02/21/2017   Phenylephrine hcl Other (See Comments) 02/12/2022   Toradol [ketorolac tromethamine] Swelling and Other (See Comments) 01/17/2015   Zoloft [sertraline hcl] Swelling 05/31/2015   Cephalexin Hives and Rash 10/22/2010   Ketorolac Rash  10/22/2010   Tramadol Hives, Swelling, Other (See Comments), and Rash 01/17/2015    Medications:  Current Outpatient Medications:    amLODipine (NORVASC) 5 MG tablet, TAKE 1 TABLET BY MOUTH EVERYDAY AT BEDTIME, Disp: 90 tablet, Rfl: 0   aspirin EC 81 MG tablet, Take 81 mg by mouth in the morning. Swallow whole., Disp: , Rfl:    atorvastatin (LIPITOR) 40 MG tablet, Take 1 tablet (40 mg total) by mouth every evening., Disp: 90 tablet, Rfl: 3   cyclobenzaprine (FEXMID) 7.5 MG tablet, Take 7.5 mg by mouth 3 (three) times daily as needed for muscle spasms., Disp: , Rfl:    diclofenac (CATAFLAM) 50 MG tablet, Take 50 mg by mouth every 4 (four) hours as needed (pain.)., Disp: , Rfl:    docusate sodium (COLACE) 100 MG capsule, Take 100 mg by mouth 2 (two) times daily., Disp: , Rfl:    ELDERBERRY PO, Take 1 tablet by mouth in the morning and at bedtime., Disp: , Rfl:    gabapentin (NEURONTIN) 600 MG tablet, Take 600 mg by mouth 4 (four) times daily., Disp: , Rfl:    LINZESS 290 MCG CAPS capsule, Take 290 mcg by mouth in the morning., Disp: , Rfl:    lisinopril (ZESTRIL) 40 MG tablet, Take 40 mg by mouth every evening., Disp: , Rfl:    metoprolol tartrate (LOPRESSOR) 25 MG tablet, TAKE 1 TABLET BY MOUTH TWICE A DAY, Disp: 180 tablet, Rfl: 0   mometasone (ELOCON) 0.1 % cream, Apply 1 Application topically daily as needed (hand rash)., Disp: , Rfl:    mometasone-formoterol (DULERA) 100-5 MCG/ACT AERO, Inhale 2 puffs into the lungs every morning., Disp: , Rfl:    Multiple Vitamins-Minerals (HAIR SKIN & NAILS PO), Take 1 tablet by mouth in the morning and at bedtime., Disp: , Rfl:    naloxone (NARCAN) 4 MG/0.1ML LIQD nasal spray kit, Place 1 spray into the nose as needed (opioid overdose)., Disp: , Rfl:    nitroGLYCERIN (NITROSTAT) 0.4 MG SL tablet, Place 0.4 mg under the tongue every 5 (five) minutes as needed for chest pain., Disp: , Rfl:    omeprazole (PRILOSEC) 40 MG capsule, Take 1 capsule (40 mg  total) by mouth 2 (two) times daily., Disp: 180 capsule, Rfl: 3   ondansetron (ZOFRAN) 8 MG tablet, Take 1 tablet (8 mg total) by mouth every 8 (eight) hours as needed for nausea or vomiting., Disp: 90 tablet, Rfl: 1   oxyCODONE (ROXICODONE) 15 MG immediate release tablet, Take 15 mg by mouth every 4 (four) hours as needed for pain., Disp: , Rfl:    sucralfate (CARAFATE) 1 GM/10ML suspension, Take 10 mLs (1 g total) by mouth 4 (four) times daily., Disp: 840 mL, Rfl: 0   Tiotropium Bromide Monohydrate (SPIRIVA RESPIMAT) 2.5 MCG/ACT AERS, Take 1 puff by mouth in the morning and at bedtime., Disp: 12 g, Rfl: 11   TURMERIC PO, Take 1 tablet by mouth in the morning and at bedtime., Disp: , Rfl:    VENTOLIN HFA 108 (90 Base) MCG/ACT inhaler, USE 1 TO 2 INHALATIONS BY MOUTH  INTO THE LUNGS EVERY  6 HOURS AS  NEEDED FOR SHORTNESS OF BREATH  OR WHEEZING, Disp: 72 g, Rfl: 2   zolpidem (AMBIEN) 5 MG tablet, Take 1 tablet (5 mg total) by mouth at bedtime as needed for sleep., Disp: 30 tablet, Rfl: 0  Social History: Social History   Tobacco Use   Smoking status: Every Day    Current packs/day: 0.50    Average packs/day: 0.5 packs/day for 35.0 years (17.5 ttl pk-yrs)    Types: Cigarettes   Smokeless tobacco: Never  Vaping Use   Vaping status: Former   Quit date: 06/24/2013  Substance Use Topics   Alcohol use: Not Currently    Alcohol/week: 0.0 standard drinks of alcohol   Drug use: Not Currently    Family Medical History: Family History  Problem Relation Age of Onset   Heart disease Mother    Lung cancer Mother    Ovarian cancer Mother    Healthy Brother    Healthy Brother    Healthy Brother    Diabetes Maternal Uncle    Breast cancer Maternal Aunt     Physical Examination: Vitals:   04/30/23 1123  BP: (!) 160/94    General: Patient is in no apparent distress. Attention to examination is appropriate.  Neck:   Supple.  Full range of motion.  Respiratory: Patient is breathing without  any difficulty.   NEUROLOGICAL:     Awake, alert, oriented to person, place, and time.  Speech is clear and fluent.   Cranial Nerves: Pupils equal round and reactive to light.  Facial tone is symmetric.  Facial sensation is symmetric. Shoulder shrug is symmetric. Tongue protrusion is midline.    Strength:  Side Iliopsoas Quads Hamstring PF DF EHL  R 5 5 5 5 5 5   L 5 5 5 5 5 5    At the bilateral Achilles she has trace reflexes, at the bilateral knees she is hyperreflexic with crossed adductor sign.  She does not have clonus, her Babinski sign is questionable.  Patchy sensation loss in the bilateral lower extremities  Regular walking gait with a forward head carriage, however she cannot tandem walk nor can she turn quickly without use of supports  Imaging: Narrative & Impression  CLINICAL DATA:  Pain. Assess for stenosis prior to spinal cord stimulator placement   EXAM: MRI THORACIC SPINE WITHOUT CONTRAST   TECHNIQUE: Multiplanar, multisequence MR imaging of the thoracic spine was performed. No intravenous contrast was administered.   COMPARISON:  No prior thoracic imaging.   FINDINGS: Alignment:  No thoracic malalignment.   Vertebrae: No thoracic region fracture or focal bone lesion.   Cord:  No primary cord lesion.  See below regarding stenosis.   Paraspinal and other soft tissues: Negative   Disc levels:   Ordinary mild disc degeneration and bulging from T1-2 through T4-5.   T5-6: Normal interspace.   T6-7: Disc bulge more prominent towards the left.  No stenosis.   T7-8: Shallow disc protrusion narrows the ventral subarachnoid space but does not compress the cord. No foraminal stenosis.   T8-9: Large bilobed disc herniation, more prominent to the right of midline. Effacement of the subarachnoid space. Indentation of the ventral cord, particularly on the right. Mild bilateral foraminal narrowing.   T9-10: Right paracentral disc herniation effaces the  ventral subarachnoid space but does not affect the cord. Mild right foraminal narrowing.   T10-11: No disc abnormality.  Mild facet and ligamentous prominence.   T11-12: Mild bulging of the disc. Mild facet and  ligamentous prominence.   T12-L1: Normal interspace.   IMPRESSION: 1. T8-9: Large bilobed disc herniation, more prominent to the right of midline. Effacement of the subarachnoid space. Indentation of the ventral cord, particularly on the right. Mild bilateral foraminal narrowing. 2. T9-10: Right paracentral disc herniation effaces the ventral subarachnoid space but does not affect the cord. Mild right foraminal narrowing. 3. T7-8: Shallow disc protrusion narrows the ventral subarachnoid space but does not compress the cord. 4. T6-7: Disc bulge more prominent towards the left. No stenosis.     Electronically Signed   By: Paulina Fusi M.D.   On: 04/15/2023 16:31     I have personally reviewed the images and agree with the above interpretation.  Medical Decision Making/Assessment and Plan: Ms. Wiemer is a pleasant 57 y.o. female with history of a prior cervical fusion as well as a prior lumbar fusion.  She has continued to have significant back and leg pain diagnosed with failed back syndrome.  She has had psychiatric clearance as well as a prior spinal cord stimulator trial which gave her over 50% reduction in her pain.  This was done in June of this year.  Since it was removed she continues to have her pain.  As part of her clearing process we obtained a thoracic MRI which interestingly showed 2 compressive disc herniations, given the fact that most of her pain is down the right side and she does have symptoms consistent with thoracic myelopathy we feel that she would benefit from a decompression and fusion of her thoracic spine either prior to or in conjunction with placement of a thoracic spinal cord stimulator.  We discussed her risk factors including smoking, we let her  know that we would necessitate her to quit smoking prior to surgery given the high risk of failure.  Will discuss with our partners whether or not it be ideal to place a spinal cord stimulator at the time of surgery as this would be the location for her thoracic spinal cord stimulator and scar tissue after fusion and risk of infection with multiple operations would be increased.  Once we have discussed this with our team will move forward with posting her case preferentially 4 to 6 weeks out to give her time to quit smoking.  However given her myelopathy it is important that we move forward.  Thank you for involving me in the care of this patient.

## 2023-05-04 ENCOUNTER — Other Ambulatory Visit: Payer: Self-pay | Admitting: Family Medicine

## 2023-05-04 DIAGNOSIS — I10 Essential (primary) hypertension: Secondary | ICD-10-CM

## 2023-05-05 ENCOUNTER — Ambulatory Visit: Payer: 59 | Admitting: Cardiology

## 2023-05-06 NOTE — Telephone Encounter (Signed)
Requested Prescriptions  Pending Prescriptions Disp Refills   metoprolol tartrate (LOPRESSOR) 25 MG tablet [Pharmacy Med Name: Metoprolol Tartrate 25 MG Oral Tablet] 100 tablet 0    Sig: TAKE ONE-HALF TABLET BY MOUTH  TWICE DAILY     Cardiovascular:  Beta Blockers Failed - 05/04/2023 10:20 PM      Failed - Last BP in normal range    BP Readings from Last 1 Encounters:  04/30/23 (!) 160/94         Passed - Last Heart Rate in normal range    Pulse Readings from Last 1 Encounters:  04/21/23 70         Passed - Valid encounter within last 6 months    Recent Outpatient Visits           2 months ago Primary insomnia   [place] [facility] [name], [place], MD   4 months ago Chronic fatigue   [place] [facility], [place], MD   7 months ago Atopic dermatitis of both hands   [place] [facility] Merita Norton T, FNP   8 months ago Headache disorder   [place] [facility] [name], [place], MD   1 year ago Essential hypertension   [facility] [name], Tawanna Cooler, MD       Future Appointments             In 4 weeks [name], Tawanna Cooler, MD [facility]   In 1 month Drema Dallas, DO [facility]   In 2 months Agbor-Etang, Arlys John, MD [facility]

## 2023-05-14 ENCOUNTER — Encounter: Payer: 59 | Admitting: Orthopedic Surgery

## 2023-05-16 ENCOUNTER — Other Ambulatory Visit: Payer: Self-pay | Admitting: Obstetrics and Gynecology

## 2023-05-16 NOTE — Patient Instructions (Signed)
Hi Sara Munoz, I hope you get to feeling better soon!  Have a nice afternoon and weekend!  Sara Munoz was given information about Medicaid Managed Care team care coordination services as a part of their [place] [employer] Medicaid benefit. Sara Munoz verbally consented to engagement with the [facility] Managed Care team.   If you are experiencing a medical emergency, please call 911 or report to your local emergency department or urgent care.   If you have a non-emergency medical problem during routine business hours, please contact your provider's office and ask to speak with a nurse.   For questions related to your [place] [employer] Medicaid health plan, please call: [phone]  If you would like to schedule transportation through your [place] [employer] Medicaid plan, please call the following number at least 2 days in advance of your appointment: [phone].   There is no limit to the number of trips during the year between medical appointments, healthcare facilities, or pharmacies. Transportation must be scheduled at least 2 business days before but not more than thirty 30 days before of your appointment.  Call the Behavioral Health Crisis Line at [phone], at any time, 24 hours a day, 7 days a week. If you are in danger or need immediate medical attention call 911.  If you would like help to quit smoking, call 1-800-QUIT-NOW (585 019 5415) OR Espaol: 1-855-Djelo-Ya (4-010-272-5366) o para ms informacin haga clic aqu or Text READY to 440-347 to register via text  Sara Munoz - following are the goals we discussed in your visit today:  Timeframe:  Long-Range Goal Priority:  High Start Date:       12/20/20                      Expected End Date:   ongoing     Follow up date:  06/16/23  - schedule appointment for flu shot - schedule appointment for vaccines needed due to my age or health - schedule recommended health tests (blood work, mammogram, colonoscopy, pap test) -  schedule and keep appointment for annual check-up   Why is this important?   Screening tests can find diseases early when they are easier to treat.  Your doctor or nurse will talk with you about which tests are important for you.  Getting shots for common diseases like the flu and shingles will help prevent them.   05/16/23:  EGD completed, to r/s colonoscopy as needs additional prep.  GI, PCP, NEURO appts in Dec.  To schedule appt with [facility] Neuro[facility].  Patient verbalizes understanding of instructions and care plan provided today and agrees to view in MyChart. Active MyChart status and patient understanding of how to access instructions and care plan via MyChart confirmed with patient.     The Managed Medicaid care management team will reach out to the patient again over the next 30 business  days.  The  Patient  has been provided with contact information for the Managed Medicaid care management team and has been advised to call with any health related questions or concerns.   Kathi Der RN, BSN [place]  [facility] Care Management Coordinator - Managed Medicaid High Risk [phone]   Following is a copy of your plan of care:  Care Plan : RN Care Manager Plan of Care  Updates made by [name], RN since 05/16/2023 12:00 AM     Problem: Health Promotion or Disease Self-Management (General Plan of Care)      Long-Range  Goal: Chronic Disease Management   Start Date: 09/12/2022  Expected End Date: 07/16/2023  Priority: High  Note:   Current Barriers:  Knowledge Deficits related to plan of care for management of GERD, CAD, HTN, PVD, TIA, migranes, COPD, OSA, tobacco use, osteoarthritis, chronic pain, anxiety  Chronic Disease Management support and education needs related to  GERD, CAD, HTN, PVD, TIA, migranes, COPD, OSA, tobacco use, osteoarthritis, chronic pain, anxiety  05/16/23:  C/O of neck and leg pain-no stimulator yet.  Awaiting appt with [facility] regarding herniated discs.  BP stable-smoking 1 ppd.  Breathing WNL.  EGD completed and to r/s colonoscopy.  RNCM Clinical Goal(s):  Patient will verbalize understanding of plan for management of GERD, CAD, HTN, PVD, TIA, migranes, COPD, OSA, tobacco use, osteoarthritis, chronic pain, anxiety   as evidenced by patient report verbalize basic understanding of  GERD, CAD, HTN, PVD, TIA, migranes, COPD, OSA, tobacco use, osteoarthritis, chronic pain, anxiety  disease process and self health management plan as evidenced by patient report take all medications exactly as prescribed and will call provider for medication related questions as evidenced by patient report  demonstrate understanding of rationale for each prescribed medication as evidenced by patient report attend all scheduled medical appointments as evidenced by patient report and EMR review demonstrate Ongoing adherence to prescribed treatment plan for  GERD, CAD, HTN, PVD, TIA, migranes, COPD, OSA, tobacco use, osteoarthritis, chronic pain, anxiety as evidenced by patient report continue to work with RN Care Manager to address care management and care coordination needs related to GERD, CAD, HTN, PVD, TIA, migranes, COPD, OSA, tobacco use, osteoarthritis, chronic pain, anxiety  as evidenced by adherence to CM Team Scheduled appointments through collaboration with RN Care manager, provider, and care team.   Interventions: Inter-disciplinary care team collaboration (see longitudinal plan of care) Evaluation of current treatment plan related to  self management and patient's adherence to plan as established by provider 01/03/23-discussed protein drink supplementation, MVI  CAD Interventions: (Status:  New goal.) Long Term Goal Assessed understanding of CAD diagnosis Medications reviewed including medications utilized in CAD treatment plan Counseled on importance of regular laboratory monitoring as prescribed Reviewed Importance of  taking all medications as prescribed Reviewed Importance of attending all scheduled provider appointments Advised to report any changes in symptoms or exercise tolerance Screening for signs and symptoms of depression related to chronic disease state Assessed social determinant of health barriers  COPD Interventions:  (Status:  New goal.) Long Term Goal Advised patient to self assesses COPD action plan zone and make appointment with provider if in the yellow zone for 48 hours without improvement Advised patient to engage in light exercise as tolerated 3-5 days a week to aid in the the management of COPD Provided education about and advised patient to utilize infection prevention strategies to reduce risk of respiratory infection Discussed the importance of adequate rest and management of fatigue with COPD Assessed social determinant of health barriers  Hypertension Interventions:  (Status:  New goal.) Long Term Goal Last practice recorded BP readings:  BP Readings from Last 3 Encounters:        05/16/23 117-140/94  03/17/23         124/117 04/15/23         117/80  Most recent eGFR/CrCl:  Lab Results  Component Value Date   EGFR 83 04/08/2022    No components found for: "CRCL"  Evaluation of current treatment plan related to hypertension self management and patient's adherence to plan as  established by provider Reviewed medications with patient and discussed importance of compliance Discussed plans with patient for ongoing care management follow up and provided patient with direct contact information for care management team Advised patient, providing education and rationale, to monitor blood pressure daily and record, calling PCP for findings outside established parameters Reviewed scheduled/upcoming provider appointments including:  Discussed complications of poorly controlled blood pressure such as heart disease, stroke, circulatory complications, vision complications, kidney  impairment, sexual dysfunction Assessed social determinant of health barriers  Pain Interventions:  (Status:  New goal.) Long Term Goal Pain assessment performed Medications reviewed Reviewed provider established plan for pain management Discussed importance of adherence to all scheduled medical appointments Counseled on the importance of reporting any/all new or changed pain symptoms or management strategies to pain management provider Advised patient to report to care team affect of pain on daily activities Reviewed with patient prescribed pharmacological and nonpharmacological pain relief strategies Assessed social determinant of health barriers  Patient Goals/Self-Care Activities: Take all medications as prescribed Attend all scheduled provider appointments Call pharmacy for medication refills 3-7 days in advance of running out of medications Perform all self care activities independently  Perform IADL's (shopping, preparing meals, housekeeping, managing finances) independently Call provider office for new concerns or questions  03/17/23:  To reschedule GI appt-completed 05/16/23:  Schedule Neurosurgery appt  Follow Up Plan:  The patient has been provided with contact information for the care management team and has been advised to call with any health related questions or concerns.  The care management team will reach out to the patient again over the next 45 business  days.

## 2023-05-16 NOTE — Patient Outreach (Signed)
Medicaid Managed Care   Nurse Care Manager Note  05/16/2023 Name:  Sara Munoz MRN:  [id number] DOB:  1965/07/22  Sara Munoz is an 57 y.o. year old female who is a primary patient of [name], Sara Cooler, MD.  The Medicaid Managed Care Coordination team was consulted for assistance with:    Chronic healthcare management needs, GERD, CAD, HTN, tobacco use, chronic pain, COPD, OSA, anxiety, osteoarthritis  Sara Munoz was given information about Medicaid Managed Care Coordination team services today. Sara Munoz Patient agreed to services and verbal consent obtained.  Engaged with patient by telephone for follow up visit in response to provider referral for case management and/or care coordination services.   Assessments/Interventions:  Review of past medical history, allergies, medications, health status, including review of consultants reports, laboratory and other test data, was performed as part of comprehensive evaluation and provision of chronic care management services.  SDOH (Social Determinants of Health) assessments and interventions performed: SDOH Interventions    Flowsheet Row Patient Outreach Telephone from 05/16/2023 in [place] POPULATION HEALTH DEPARTMENT Patient Outreach Telephone from 04/15/2023 in [place] POPULATION HEALTH DEPARTMENT Patient Outreach Telephone from 03/17/2023 in [place] POPULATION HEALTH DEPARTMENT Patient Outreach Telephone from 02/03/2023 in [place] POPULATION HEALTH DEPARTMENT Patient Outreach Telephone from 01/01/2023 in [place] POPULATION HEALTH DEPARTMENT Patient Outreach Telephone from 12/02/2022 in  POPULATION HEALTH DEPARTMENT  SDOH Interventions        Food Insecurity Interventions Intervention Not Indicated -- -- -- -- --  Housing Interventions -- -- -- Intervention Not Indicated -- --  Transportation Interventions -- -- -- -- Intervention Not Indicated --  Utilities Interventions -- -- -- -- -- Intervention  Not Indicated  Alcohol Usage Interventions -- -- -- -- -- Intervention Not Indicated (Score <7)  Financial Strain Interventions -- -- -- -- Intervention Not Indicated --  Physical Activity Interventions -- Intervention Not Indicated, Other (Comments)  [not able to engage in this type of exercixe] -- -- -- --  Stress Interventions -- -- Provide Counseling, Other (Comment)  [sees therapist as scheduled] -- -- --  Social Connections Interventions -- Intervention Not Indicated -- -- -- --  Health Literacy Interventions -- -- -- Intervention Not Indicated -- --     Care Plan Allergies  Allergen Reactions   Cyclobenzaprine Hives, Swelling and Rash    Facial/lip swelling      Levofloxacin Hives, Swelling and Rash   Chlorpheniramine Maleate     Other reaction(s): Unknown   Phenergan [Promethazine Hcl] Other (See Comments)    Agitation.    Phenylephrine Hcl Other (See Comments)    unknown   Toradol [Ketorolac Tromethamine] Swelling and Other (See Comments)    Facial/tongue swelling    Zoloft [Sertraline Hcl] Swelling    Tongue swelling      Cephalexin Hives and Rash   Ketorolac Rash   Tramadol Hives, Swelling, Other (See Comments) and Rash    Lip swelling    Medications Reviewed Today     Reviewed by [name], RN (Registered Nurse) on 05/16/23 at 1244  Med List Status: <None>   Medication Order Taking? Sig Documenting Provider Last Dose Status Informant  amLODipine (NORVASC) 5 MG tablet [id number] No TAKE 1 TABLET BY MOUTH EVERYDAY AT BEDTIME [name], [name], MD 04/20/2023 Active Self  aspirin EC 81 MG tablet [id number] No Take 81 mg by mouth in the morning. Swallow whole. [provider] 04/20/2023 Active Self  atorvastatin (LIPITOR) 40 MG tablet [id number] No  Take 1 tablet (40 mg total) by mouth every evening. Debbe Odea, MD 04/20/2023 Active Self  cyclobenzaprine (FEXMID) 7.5 MG tablet [id number]  Take 7.5 mg by mouth 3 (three) times daily as needed for  muscle spasms. [provider]  Active Self  diclofenac (CATAFLAM) 50 MG tablet [id number] No Take 50 mg by mouth every 4 (four) hours as needed (pain.). [provider] 04/20/2023 Active Self  docusate sodium (COLACE) 100 MG capsule [id number] No Take 100 mg by mouth 2 (two) times daily. [provider] 04/20/2023 Active Self  ELDERBERRY PO [id number] No Take 1 tablet by mouth in the morning and at bedtime. [provider] 04/20/2023 Active Self  gabapentin (NEURONTIN) 600 MG tablet [id number] No Take 600 mg by mouth 4 (four) times daily. [provider] 04/20/2023 Active Self  LINZESS 290 MCG CAPS capsule [id number] No Take 290 mcg by mouth in the morning. [provider] 04/20/2023 Active Self  lisinopril (ZESTRIL) 40 MG tablet [id number] No Take 40 mg by mouth every evening. [provider] 04/20/2023 Active Self  metoprolol tartrate (LOPRESSOR) 25 MG tablet [id number]  TAKE ONE-HALF TABLET BY MOUTH  TWICE DAILY [name], [name], MD  Active   mometasone (ELOCON) 0.1 % cream [id number] No Apply 1 Application topically daily as needed (hand rash). Poggi, Excell Seltzer, MD Taking Active Self  mometasone-formoterol [facility]) 100-5 MCG/ACT Sandrea Matte [id number] No Inhale 2 puffs into the lungs every morning. Poggi, Excell Seltzer, MD Taking Active Self  Multiple Vitamins-Minerals (HAIR SKIN & NAILS PO) [id number] No Take 1 tablet by mouth in the morning and at bedtime. [provider] Past Week Active Self  naloxone (NARCAN) 4 MG/0.1ML LIQD nasal spray kit [id number] No Place 1 spray into the nose as needed (opioid overdose). [provider] Taking Active Self           Med Note Richardean Canal May 03, 2021 11:23 AM)    nitroGLYCERIN (NITROSTAT) 0.4 MG SL tablet [id number]  Place 0.4 mg under the tongue every 5 (five) minutes as needed for chest pain. [provider]  Active   omeprazole (PRILOSEC) 40 MG capsule [id number]  Take 1  capsule (40 mg total) by mouth 2 (two) times daily. [name], MD  Active Self  ondansetron [facility]) 8 MG tablet [id number] No Take 1 tablet (8 mg total) by mouth every 8 (eight) hours as needed for nausea or vomiting. [name], [name], MD Taking Active Self  oxyCODONE (ROXICODONE) 15 MG immediate release tablet [id number] No Take 15 mg by mouth every 4 (four) hours as needed for pain. [provider] 04/21/2023 0500 Active Self  sucralfate (CARAFATE) 1 GM/10ML suspension [id number] No Take 10 mLs (1 g total) by mouth 4 (four) times daily. [name], MD 04/20/2023 Active Self  Tiotropium Bromide Monohydrate (SPIRIVA RESPIMAT) 2.5 MCG/ACT AERS [id number] No Take 1 puff by mouth in the morning and at bedtime. Jacky Kindle, FNP 04/20/2023 Active Self  TURMERIC PO [id number] No Take 1 tablet by mouth in the morning and at bedtime. [provider] Past Week Active Self  VENTOLIN HFA 108 (90 Base) MCG/ACT inhaler [id number] No USE 1 TO 2 INHALATIONS BY MOUTH  INTO THE LUNGS EVERY 6 HOURS AS  NEEDED FOR SHORTNESS OF BREATH  OR WHEEZING [name], [name], MD Past Week Active Self  zolpidem (AMBIEN) 5 MG tablet [id number] No Take 1 tablet (5 mg total) by mouth at bedtime as needed for sleep. 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   History of lumbar fusion 03/18/2023   S/P cervical spinal fusion 03/18/2023   Tendinitis of upper biceps tendon of right shoulder 01/17/2023   Rotator cuff tendinitis, right 01/17/2023   Chronic fatigue 01/06/2023   Prediabetes 01/06/2023   Status post left rotator cuff repair 12/04/2022   Foreign body left in shoulder 12/04/2022   Atopic dermatitis of both hands 09/12/2022   Headache disorder  08/13/2022   Nontraumatic incomplete tear of left rotator cuff 05/28/2022   Spondylolisthesis 04/19/2022   Encounter for annual physical examination excluding gynecological examination in a patient older than 17 years 04/03/2022   Essential hypertension 03/11/2022   Lumbar spondylosis 02/12/2022   Chronic migraine w/o aura w/o status migrainosus, not intractable 07/09/2021   Obstructive sleep apnea 07/09/2021   Body mass index (BMI) 32.0-32.9, adult 06/07/2021   Pseudoarthrosis of cervical spine (HCC) 05/14/2021   Lumbar radiculopathy 05/23/2020   Sacroiliac inflammation (HCC) 11/25/2019   Sepsis (HCC) 10/19/2018   Severe sepsis (HCC) 10/19/2018   Cervical radiculopathy 02/12/2018   Chronic migraine 02/12/2018   Paresthesia 08/29/2017   Neck pain 08/29/2017   Daily headache 08/29/2017   Chronic active hepatitis (HCC) 07/29/2017   Neurogenic pain 07/31/2016   Chronic pain syndrome 06/05/2016   Carotid artery stenosis 02/27/2016   Chronic constipation 02/27/2016   Chronic nausea 02/27/2016   Hypercholesterolemia 02/27/2016   Osteoarthritis 02/27/2016   PTSD (post-traumatic stress disorder) 02/27/2016   TIA (transient ischemic attack) 02/27/2016   Long term current use of opiate analgesic 02/27/2016   Long term prescription opiate use 02/27/2016   Encounter for pain management consult 02/27/2016   Chronic hip pain (Location of Tertiary source of pain) (Bilateral) (L>R) 02/27/2016   Chronic knee pain (Bilateral) (L>R) 02/27/2016   Chronic shoulder pain (Bilateral) (L>R) 02/27/2016   Chronic sacroiliac joint pain (Bilateral) (L>R) 02/27/2016   Chronic low back pain (Location of Primary Source of Pain) (Bilateral) (L>R) 02/27/2016   Chronic lower extremity pain (Location of Secondary source of pain) (Bilateral) (L>R) 02/27/2016   Osteoarthritis of hip (Bilateral) (L>R) 02/27/2016   Chronic neck pain (posterior midline) (Bilateral) (L>R) 02/27/2016   Cervicogenic headache (Bilateral)  (L>R) 02/27/2016   Occipital headache (Bilateral) (L>R) 02/27/2016   Chronic upper extremity pain (Bilateral) (L>R) 02/27/2016   Chronic Cervical radicular pain (Bilateral) (L>R) 02/27/2016   Chronic lumbar radicular pain (Right) (L5 dermatome) 02/27/2016   Lumbar facet syndrome (Bilateral) (L>R) 02/27/2016   Long term prescription benzodiazepine use 02/27/2016   Hypertension    Chronic obstructive pulmonary disease (HCC) 10/09/2015   GERD (gastroesophageal reflux disease) 10/09/2015   Non-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/2014 08/02/2015   Hyperlipidemia 08/02/2015   Costochondritis 08/02/2015   Drug abuse, 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   GAD (generalized anxiety disorder) 09/03/2014   Narcotic dependence (HCC) 09/03/2014   PVD (peripheral vascular disease) (HCC) 09/03/2014   Smoker 09/03/2014   Cigarette nicotine dependence with nicotine-induced disorder 07/08/2014   Anemia of chronic disease 03/19/2014   Narcotic abuse, continuous (HCC) 03/19/2014   Polysubstance abuse (HCC) 03/19/2014   MSSA (methicillin susceptible Staphylococcus aureus) septicemia (HCC) 03/07/2014   Hypomagnesemia 01/04/2014   Chronic cough 09/04/2011   Sleep apnea, obstructive 09/04/2011   Sinusitis, acute 09/04/2011   Conditions to be addressed/monitored per PCP order:  Chronic healthcare management  needs, GERD, CAD, HTN, tobacco use, chronic pain, COPD, OSA, anxiety, osteoarthritis  Care Plan : RN Care Manager Plan of Care  Updates made by [name], RN since 05/16/2023 12:00 AM     Problem: Health Promotion or Disease Self-Management (General Plan of Care)      Long-Range Goal: Chronic Disease Management   Start Date: 09/12/2022  Expected End Date: 07/16/2023  Priority: High  Note:   Current Barriers:  Knowledge Deficits related to plan of care for management of GERD, CAD, HTN, PVD, TIA, migranes, COPD, OSA, tobacco use, osteoarthritis, chronic pain, anxiety  Chronic Disease Management support and education  needs related to  GERD, CAD, HTN, PVD, TIA, migranes, COPD, OSA, tobacco use, osteoarthritis, chronic pain, anxiety  05/16/23:  C/O of neck and leg pain-no stimulator yet.  Awaiting appt with [facility] regarding herniated discs.  BP stable-smoking 1 ppd.  Breathing WNL.  EGD completed and to r/s colonoscopy.  RNCM Clinical Goal(s):  Patient will verbalize understanding of plan for management of GERD, CAD, HTN, PVD, TIA, migranes, COPD, OSA, tobacco use, osteoarthritis, chronic pain, anxiety   as evidenced by patient report verbalize basic understanding of  GERD, CAD, HTN, PVD, TIA, migranes, COPD, OSA, tobacco use, osteoarthritis, chronic pain, anxiety  disease process and self health management plan as evidenced by patient report take all medications exactly as prescribed and will call provider for medication related questions as evidenced by patient report  demonstrate understanding of rationale for each prescribed medication as evidenced by patient report attend all scheduled medical appointments as evidenced by patient report and EMR review demonstrate Ongoing adherence to prescribed treatment plan for  GERD, CAD, HTN, PVD, TIA, migranes, COPD, OSA, tobacco use, osteoarthritis, chronic pain, anxiety as evidenced by patient report continue to work with RN Care Manager to address care management and care coordination needs related to GERD, CAD, HTN, PVD, TIA, migranes, COPD, OSA, tobacco use, osteoarthritis, chronic pain, anxiety  as evidenced by adherence to CM Team Scheduled appointments through collaboration with RN Care manager, provider, and care team.   Interventions: Inter-disciplinary care team collaboration (see longitudinal plan of care) Evaluation of current treatment plan related to  self management and patient's adherence to plan as established by provider 01/03/23-discussed protein drink supplementation, MVI  CAD Interventions: (Status:  New goal.) Long Term Goal Assessed  understanding of CAD diagnosis Medications reviewed including medications utilized in CAD treatment plan Counseled on importance of regular laboratory monitoring as prescribed Reviewed Importance of taking all medications as prescribed Reviewed Importance of attending all scheduled provider appointments Advised to report any changes in symptoms or exercise tolerance Screening for signs and symptoms of depression related to chronic disease state Assessed social determinant of health barriers  COPD Interventions:  (Status:  New goal.) Long Term Goal Advised patient to self assesses COPD action plan zone and make appointment with provider if in the yellow zone for 48 hours without improvement Advised patient to engage in light exercise as tolerated 3-5 days a week to aid in the the management of COPD Provided education about and advised patient to utilize infection prevention strategies to reduce risk of respiratory infection Discussed the importance of adequate rest and management of fatigue with COPD Assessed social determinant of health barriers  Hypertension Interventions:  (Status:  New goal.) Long Term Goal Last practice recorded BP readings:  BP Readings from Last 3 Encounters:        05/16/23 117-140/94  03/17/23  124/117 04/15/23         117/80  Most recent eGFR/CrCl:  Lab Results  Component Value Date   EGFR 83 04/08/2022    No components found for: "CRCL"  Evaluation of current treatment plan related to hypertension self management and patient's adherence to plan as established by provider Reviewed medications with patient and discussed importance of compliance Discussed plans with patient for ongoing care management follow up and provided patient with direct contact information for care management team Advised patient, providing education and rationale, to monitor blood pressure daily and record, calling PCP for findings outside established parameters Reviewed  scheduled/upcoming provider appointments including:  Discussed complications of poorly controlled blood pressure such as heart disease, stroke, circulatory complications, vision complications, kidney impairment, sexual dysfunction Assessed social determinant of health barriers  Pain Interventions:  (Status:  New goal.) Long Term Goal Pain assessment performed Medications reviewed Reviewed provider established plan for pain management Discussed importance of adherence to all scheduled medical appointments Counseled on the importance of reporting any/all new or changed pain symptoms or management strategies to pain management provider Advised patient to report to care team affect of pain on daily activities Reviewed with patient prescribed pharmacological and nonpharmacological pain relief strategies Assessed social determinant of health barriers  Patient Goals/Self-Care Activities: Take all medications as prescribed Attend all scheduled provider appointments Call pharmacy for medication refills 3-7 days in advance of running out of medications Perform all self care activities independently  Perform IADL's (shopping, preparing meals, housekeeping, managing finances) independently Call provider office for new concerns or questions  03/17/23:  To reschedule GI appt-completed 05/16/23:  Schedule Neurosurgery appt  Follow Up Plan:  The patient has been provided with contact information for the care management team and has been advised to call with any health related questions or concerns.  The care management team will reach out to the patient again over the next 45 business  days.   Follow Up:  Patient agrees to Care Plan and Follow-up.  Plan: The Managed Medicaid care management team will reach out to the patient again over the next 30 business  days. and The  Patient has been provided with contact information for the Managed Medicaid care management team and has been advised to call with any  health related questions or concerns.  Date/time of next scheduled RN care management/care coordination outreach:  06/16/23 at 1230.

## 2023-05-29 ENCOUNTER — Other Ambulatory Visit: Payer: Self-pay | Admitting: Gastroenterology

## 2023-06-02 ENCOUNTER — Telehealth: Payer: Self-pay | Admitting: Physician Assistant

## 2023-06-02 ENCOUNTER — Ambulatory Visit: Payer: 59 | Admitting: Physician Assistant

## 2023-06-02 NOTE — Telephone Encounter (Signed)
Patient called back and reschedule.

## 2023-06-02 NOTE — Telephone Encounter (Signed)
Patient called in left a voicemail request to reschedule her appointment because she is having real bad pain and diarrhea. I called the patient back to inform her we receive her message, and I left her a voicemail to call us back to reschedule.

## 2023-06-03 ENCOUNTER — Ambulatory Visit: Payer: 59 | Admitting: Family Medicine

## 2023-06-03 ENCOUNTER — Telehealth: Payer: Self-pay | Admitting: Neurosurgery

## 2023-06-03 ENCOUNTER — Encounter: Payer: Self-pay | Admitting: Family Medicine

## 2023-06-03 VITALS — BP 136/76 | HR 74 | Resp 18 | Ht 64.0 in | Wt 190.0 lb

## 2023-06-03 DIAGNOSIS — I739 Peripheral vascular disease, unspecified: Secondary | ICD-10-CM

## 2023-06-03 DIAGNOSIS — R110 Nausea: Secondary | ICD-10-CM

## 2023-06-03 DIAGNOSIS — F411 Generalized anxiety disorder: Secondary | ICD-10-CM

## 2023-06-03 DIAGNOSIS — I10 Essential (primary) hypertension: Secondary | ICD-10-CM

## 2023-06-03 DIAGNOSIS — M545 Low back pain, unspecified: Secondary | ICD-10-CM

## 2023-06-03 DIAGNOSIS — G4733 Obstructive sleep apnea (adult) (pediatric): Secondary | ICD-10-CM | POA: Diagnosis not present

## 2023-06-03 DIAGNOSIS — I2510 Atherosclerotic heart disease of native coronary artery without angina pectoris: Secondary | ICD-10-CM

## 2023-06-03 DIAGNOSIS — F5101 Primary insomnia: Secondary | ICD-10-CM

## 2023-06-03 DIAGNOSIS — J441 Chronic obstructive pulmonary disease with (acute) exacerbation: Secondary | ICD-10-CM

## 2023-06-03 DIAGNOSIS — G8929 Other chronic pain: Secondary | ICD-10-CM

## 2023-06-03 DIAGNOSIS — E782 Mixed hyperlipidemia: Secondary | ICD-10-CM

## 2023-06-03 DIAGNOSIS — F1120 Opioid dependence, uncomplicated: Secondary | ICD-10-CM

## 2023-06-03 DIAGNOSIS — F17200 Nicotine dependence, unspecified, uncomplicated: Secondary | ICD-10-CM

## 2023-06-03 MED ORDER — ONDANSETRON HCL 8 MG PO TABS: 8.0000 mg | ORAL_TABLET | Freq: Three times a day (TID) | ORAL | 1 refills | Status: DC | PRN

## 2023-06-03 MED ORDER — ZOLPIDEM TARTRATE 5 MG PO TABS: 5.0000 mg | ORAL_TABLET | Freq: Every evening | ORAL | 0 refills | Status: DC | PRN

## 2023-06-03 MED ORDER — BENZONATATE 100 MG PO CAPS: 100.0000 mg | ORAL_CAPSULE | Freq: Two times a day (BID) | ORAL | 2 refills | Status: DC | PRN

## 2023-06-03 MED ORDER — PROMETHAZINE-DM 6.25-15 MG/5ML PO SYRP: 5.0000 mL | ORAL_SOLUTION | Freq: Four times a day (QID) | ORAL | 0 refills | Status: DC | PRN

## 2023-06-03 MED ORDER — PREDNISONE 20 MG PO TABS: 40.0000 mg | ORAL_TABLET | Freq: Every day | ORAL | 0 refills | Status: AC

## 2023-06-03 NOTE — Assessment & Plan Note (Signed)
Chronic  Does not use CPAP  Recommended follow up with pulm

## 2023-06-03 NOTE — Assessment & Plan Note (Signed)
On aspirin, Lipitor, metoprolol, and nitroglycerin for CAD. No recent angina reported. - Continue aspirin 81 mg daily - Continue Lipitor 40 mg daily - Continue metoprolol 25 mg twice daily - Continue nitroglycerin 0.4 mg as needed for angina

## 2023-06-03 NOTE — Assessment & Plan Note (Signed)
On Lipitor for hyperlipidemia. No recent lipid panel results discussed. - Continue Lipitor 40 mg daily

## 2023-06-03 NOTE — Assessment & Plan Note (Signed)
Blood pressure well-controlled at 136/76. Current medications include lisinopril, metoprolol, and amlodipine. - Continue lisinopril 40 mg daily - Continue metoprolol 25 mg twice daily - Continue amlodipine 5 mg daily

## 2023-06-03 NOTE — Assessment & Plan Note (Signed)
Increased wheezing and coughing over the past two months, using albuterol 3-4 times daily. Wheezing and rhonchi noted on exam. No fever or increased sputum production. Discussed short course of steroids to reduce inflammation. Patient agreeable to treatment plan. - Prescribe prednisone 40 mg once daily for 5 days - Send Tessalon Perles 100 mg twice daily as needed for cough - Follow up with pulmonologist - Continue Dulera 100/5 mcg two puffs every morning - Continue Spiriva 2.5 mcg morning and bedtime - Continue albuterol as needed

## 2023-06-03 NOTE — Assessment & Plan Note (Signed)
Counseled 3 mins on importance of smoking cessation  Pt voiced understanding  Reports she is working on cutting down on cigarette smoking, down to 1/2 pack per day

## 2023-06-03 NOTE — Patient Instructions (Signed)
It was a pleasure to see you today!  Thank you for choosing [facility] for your primary care.     To keep you healthy, please keep in mind the following health maintenance items that you are due for:   Pneumococcal Vaccine   Shingrix   Best Wishes,   Dr. Roxan Hockey

## 2023-06-03 NOTE — Telephone Encounter (Signed)
She confirmed for Thursday.

## 2023-06-03 NOTE — Telephone Encounter (Signed)
Patient is calling to see about getting her surgery rescheduled. She states that Dr. Katrinka Blazing was supposed to call her and follow up. Please advise.

## 2023-06-03 NOTE — Assessment & Plan Note (Signed)
Chronic  Stable on Zofran 8mg  every 8 hours Prn  Refills provided for Zofran 8mg 

## 2023-06-03 NOTE — Assessment & Plan Note (Signed)
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 Awaiting surgery date from neurosurgeon Dr. Katrinka Blazing. Pain management includes gabapentin and oxycodone. Discussed surgical risks including infection, bleeding, and potential need for further surgeries. Patient prefers to proceed with Dr. Katrinka Blazing but is considering another surgeon if delays continue. - Follow up with Dr. Katrinka Blazing for surgery date - Continue gabapentin 600 mg four times daily - Continue oxycodone 15 mg every four hours as needed for severe pain - continue pain management follow up as scheduled

## 2023-06-03 NOTE — Progress Notes (Signed)
Established patient visit   Patient: Sara Munoz   DOB: 12/16/1965   57 y.o. Female  MRN: [id number] Visit Date: 06/03/2023  Today's healthcare provider: Ronnald Ramp, MD   Chief Complaint  Patient presents with   Medical Management of Chronic Issues   Insomnia        Subjective     HPI     Insomnia    Additional comments:        Last edited by Ashok Cordia, CMA on 06/03/2023  3:14 PM.       Discussed the use of AI scribe software for clinical note transcription with the patient, who gave verbal consent to proceed.  History of Present Illness   The patient, a 57 year old with a history of hypertension, obstructive sleep apnea, hyperlipidemia, generalized anxiety, chronic low back pain, peripheral vascular disease, and coronary artery disease, presents for a chronic follow-up. She is a tobacco smoker and on chronic narcotics. She has been evaluated by gastroenterology, neurosurgery, neurology, and orthopedic surgery for her chronic back pain.  In addition to her chronic back pain, the patient has been experiencing gastrointestinal discomfort, which is currently being investigated by gastroenterology. Despite being on Linzess and Prilosec, the patient reports no improvement in her stomach issues.  The patient also reports an increase in bronchitis symptoms, including wheezing and coughing, over the past two months. She has been using Daliresp, Spiriva, and Albuterol for her chronic lung disease, but the frequency of Albuterol use has increased to three or four times a day. The patient reports no fevers or increased sputum production, but notes that the wheezing is worse when lying down.  The patient is also on a regimen of medications for her various conditions, including Lisinopril, Metoprolol, Aspirin, Lipitor, Flexeril, Gabapentin, Linzess, Dulera, Nitroglycerin, Prilosec, Oxycodone, and Ambien. She reports that the Ambien has been helping with her  sleep, and she is not currently using a CPAP machine for her obstructive sleep apnea.  The patient is actively trying to reduce her smoking and is currently at about half a pack a day. She has multiple upcoming appointments with various specialists to manage her conditions.         Past Medical History:  Diagnosis Date   Anemia    Anxiety    Aortic atherosclerosis (HCC)    Asthma    Atypical chest pain 08/02/2015   Carotid artery disease (HCC)    Chronic back pain    Chronic nausea    Chronic, continuous use of opioids 02/27/2016   a.) has naloxone Rx available   COPD (chronic obstructive pulmonary disease) (HCC)    Coronary artery disease    a.) LHC 2011: no sig CAD (report unavailable); b.) LHC 07/07/2014: 10-20% mLM, 25% LAD, 30-40% LCx, 20-30% RCA -- med mgmt; c.) LHC 02/22/2016: 25% LM, 25% pLCx --> med mgmt; d.) MPI 07/10/2017: no ischemia; e.) MPI 09/01/2019: no ischemia; f.) cCTA 10/28/2022: Ca score 1092 (99th percentile for age/sex/race match control)   Current smoker    DDD (degenerative disc disease), cervical    a.) s/p ACDF C5-C7 and PSIF C5-T1   DDD (degenerative disc disease), lumbar 05/23/2020   a.) s/p L5-S1 PLIF   GERD (gastroesophageal reflux disease)    Hepatic steatosis    Hepatitis C virus infection cured after antiviral drug therapy    a.) s/p Tx with standard glecaprevir-pibrentasvir course   Hypercholesteremia    Hyperkalemia    Hyperlipidemia    Hypertension  Hypokalemia    Hypomagnesemia 01/04/2014   Incomplete tear of left rotator cuff    Insomnia    a.) on hypnotic PRN (zolpidem)   MI (myocardial infarction) (HCC) 2012   Migraines    MSSA (methicillin susceptible Staphylococcus aureus) septicemia (HCC) 03/03/2014   a.) TEE (-) for valvular vegitation   Nerve pain    per patient, in the lower back   Osteoarthritis    Pneumonia    PTSD (post-traumatic stress disorder)    PVD (peripheral vascular disease) (HCC)    Tendinitis of left  rotator cuff    TIA (transient ischemic attack) 2014    Medications: Outpatient Medications Prior to Visit  Medication Sig   amLODipine (NORVASC) 5 MG tablet TAKE 1 TABLET BY MOUTH EVERYDAY AT BEDTIME   aspirin EC 81 MG tablet Take 81 mg by mouth in the morning. Swallow whole.   atorvastatin (LIPITOR) 40 MG tablet Take 1 tablet (40 mg total) by mouth every evening.   cyclobenzaprine (FEXMID) 7.5 MG tablet Take 7.5 mg by mouth 3 (three) times daily as needed for muscle spasms.   diclofenac (CATAFLAM) 50 MG tablet Take 50 mg by mouth every 4 (four) hours as needed (pain.).   docusate sodium (COLACE) 100 MG capsule Take 100 mg by mouth 2 (two) times daily.   ELDERBERRY PO Take 1 tablet by mouth in the morning and at bedtime.   gabapentin (NEURONTIN) 600 MG tablet Take 600 mg by mouth 4 (four) times daily.   LINZESS 290 MCG CAPS capsule Take 290 mcg by mouth in the morning.   lisinopril (ZESTRIL) 40 MG tablet Take 40 mg by mouth every evening.   metoprolol tartrate (LOPRESSOR) 25 MG tablet TAKE ONE-HALF TABLET BY MOUTH  TWICE DAILY   mometasone (ELOCON) 0.1 % cream Apply 1 Application topically daily as needed (hand rash).   mometasone-formoterol (DULERA) 100-5 MCG/ACT AERO Inhale 2 puffs into the lungs every morning.   Multiple Vitamins-Minerals (HAIR SKIN & NAILS PO) Take 1 tablet by mouth in the morning and at bedtime.   naloxone (NARCAN) 4 MG/0.1ML LIQD nasal spray kit Place 1 spray into the nose as needed (opioid overdose).   nitroGLYCERIN (NITROSTAT) 0.4 MG SL tablet Place 0.4 mg under the tongue every 5 (five) minutes as needed for chest pain.   omeprazole (PRILOSEC) 40 MG capsule Take 1 capsule (40 mg total) by mouth 2 (two) times daily.   oxyCODONE (ROXICODONE) 15 MG immediate release tablet Take 15 mg by mouth every 4 (four) hours as needed for pain.   sucralfate (CARAFATE) 1 GM/10ML suspension TAKE 10 MLS (1 G TOTAL) BY MOUTH 4 (FOUR) TIMES DAILY.   Tiotropium Bromide Monohydrate  (SPIRIVA RESPIMAT) 2.5 MCG/ACT AERS Take 1 puff by mouth in the morning and at bedtime.   TURMERIC PO Take 1 tablet by mouth in the morning and at bedtime.   VENTOLIN HFA 108 (90 Base) MCG/ACT inhaler USE 1 TO 2 INHALATIONS BY MOUTH  INTO THE LUNGS EVERY 6 HOURS AS  NEEDED FOR SHORTNESS OF BREATH  OR WHEEZING   [DISCONTINUED] ondansetron (ZOFRAN) 8 MG tablet Take 1 tablet (8 mg total) by mouth every 8 (eight) hours as needed for nausea or vomiting.   [DISCONTINUED] zolpidem (AMBIEN) 5 MG tablet Take 1 tablet (5 mg total) by mouth at bedtime as needed for sleep.   No facility-administered medications prior to visit.    Review of Systems  Last CBC Lab Results  Component Value Date   WBC 7.1  04/17/2023   HGB 11.3 04/17/2023   HCT 37.6 04/17/2023   MCV 76 (L) 04/17/2023   MCH 22.8 (L) 04/17/2023   RDW 18.6 (H) 04/17/2023   PLT 278 04/17/2023   Last metabolic panel Lab Results  Component Value Date   GLUCOSE 98 04/18/2023   NA 138 04/18/2023   K 4.2 04/18/2023   CL 104 04/18/2023   CO2 27 04/18/2023   BUN 15 04/18/2023   CREATININE 0.77 04/18/2023   GFRNONAA >60 04/18/2023   CALCIUM 9.0 04/18/2023   PHOS 2.8 05/16/2021   PROT 6.5 01/06/2023   ALBUMIN 4.0 01/06/2023   LABGLOB 2.5 01/06/2023   AGRATIO 1.6 04/03/2022   BILITOT <0.2 01/06/2023   ALKPHOS 85 01/06/2023   AST 15 01/06/2023   ALT 10 01/06/2023   ANIONGAP 7 04/18/2023   Last lipids Lab Results  Component Value Date   CHOL 268 (H) 04/03/2022   HDL 37 (L) 04/03/2022   LDLCALC 179 (H) 04/03/2022   TRIG 268 (H) 04/03/2022   CHOLHDL 7.2 (H) 04/03/2022   Last hemoglobin A1c Lab Results  Component Value Date   HGBA1C 6.0 (H) 01/06/2023   Last thyroid functions Lab Results  Component Value Date   TSH 1.060 01/06/2023        Objective    BP 136/76   Pulse 74   Resp 18   Ht 5\' 4"  (1.626 m)   Wt 190 lb (86.2 kg)   LMP 12/16/2017 (Approximate)   SpO2 98%   BMI 32.61 kg/m  BP Readings from Last 3  Encounters:  06/03/23 136/76  04/30/23 (!) 160/94  04/21/23 129/73   Wt Readings from Last 3 Encounters:  06/03/23 190 lb (86.2 kg)  04/30/23 185 lb 9.6 oz (84.2 kg)  04/21/23 184 lb (83.5 kg)        Physical Exam  General: Alert, no acute distress Cardio: Normal S1 and S2, RRR, no r/m/g Pulm: normal work of breathing on RA, rhonchi at bilateral lung bases, occasional wheezing  Abdomen: Bowel sounds normal. Abdomen soft and non-tender.    No results found for any visits on 06/03/23.  Assessment & Plan     Problem List Items Addressed This Visit       Cardiovascular and Mediastinum   PVD (peripheral vascular disease) (HCC)   Non-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/2014    On aspirin, Lipitor, metoprolol, and nitroglycerin for CAD. No recent angina reported. - Continue aspirin 81 mg daily - Continue Lipitor 40 mg daily - Continue metoprolol 25 mg twice daily - Continue nitroglycerin 0.4 mg as needed for angina      Essential hypertension - Primary    Blood pressure well-controlled at 136/76. Current medications include lisinopril, metoprolol, and amlodipine. - Continue lisinopril 40 mg daily - Continue metoprolol 25 mg twice daily - Continue amlodipine 5 mg daily        Respiratory   Sleep apnea, obstructive    Chronic  Does not use CPAP  Recommended follow up with pulm       Chronic obstructive pulmonary disease (HCC)    Increased wheezing and coughing over the past two months, using albuterol 3-4 times daily. Wheezing and rhonchi noted on exam. No fever or increased sputum production. Discussed short course of steroids to reduce inflammation. Patient agreeable to treatment plan. - Prescribe prednisone 40 mg once daily for 5 days - Send Tessalon Perles 100 mg twice daily as needed for cough - Follow up with pulmonologist - Continue Elwin Sleight  100/5 mcg two puffs every morning - Continue Spiriva 2.5 mcg morning and bedtime - Continue albuterol as needed       Relevant Medications   benzonatate (TESSALON) 100 MG capsule   predniSONE (DELTASONE) 20 MG tablet   promethazine-dextromethorphan (PROMETHAZINE-DM) 6.25-15 MG/5ML syrup     Other   Smoker    Counseled 3 mins on importance of smoking cessation  Pt voiced understanding  Reports she is working on cutting down on cigarette smoking, down to 1/2 pack per day       Primary insomnia   Relevant Medications   zolpidem (AMBIEN) 5 MG tablet   Narcotic dependence (HCC)   Hyperlipidemia    On Lipitor for hyperlipidemia. No recent lipid panel results discussed. - Continue Lipitor 40 mg daily      GAD (generalized anxiety disorder)   Chronic nausea    Chronic  Stable on Zofran 8mg  every 8 hours Prn  Refills provided for Zofran 8mg       Relevant Medications   ondansetron (ZOFRAN) 8 MG tablet   Chronic low back pain (Location of Primary Source of Pain) (Bilateral) (L>R) (Chronic)    Chronic low back pain with three herniated discs. Awaiting surgery date from neurosurgeon Dr. Katrinka Blazing. Pain management includes gabapentin and oxycodone. Discussed surgical risks including infection, bleeding, and potential need for further surgeries. Patient prefers to proceed with Dr. Katrinka Blazing but is considering another surgeon if delays continue. - Follow up with Dr. Katrinka Blazing for surgery date - Continue gabapentin 600 mg four times daily - Continue oxycodone 15 mg every four hours as needed for severe pain - continue pain management follow up as scheduled       Relevant Medications   predniSONE (DELTASONE) 20 MG tablet     Gastrointestinal Discomfort Ongoing stomach issues despite current medications. On Linzess, Prilosec, and Carafate. Patient reports no improvement with current regimen. - Continue Linzess 290 mcg daily - Continue Prilosec 40 mg daily - Continue Carafate as prescribed - continue GI follow up as scheduled    Insomnia On Ambien for insomnia and reports it is helping. - Continue Ambien 5 mg  at bedtime   General Health Maintenance Due for pneumococcal and tetanus vaccines. - Administer pneumococcal vaccine - Administer tetanus vaccine  Follow-up - Follow up with neurologist on December 24 - Follow up with [facility] on December 23 - Follow up with orthopedic doctor on December 16.         Return in about 4 months (around 10/02/2023) for CHRONIC F/U.         Ronnald Ramp, MD  [facility] [phone] (phone) [phone] (fax)  [facility]

## 2023-06-05 ENCOUNTER — Other Ambulatory Visit: Payer: Self-pay | Admitting: Family Medicine

## 2023-06-05 ENCOUNTER — Ambulatory Visit (INDEPENDENT_AMBULATORY_CARE_PROVIDER_SITE_OTHER): Payer: 59 | Admitting: Neurosurgery

## 2023-06-05 DIAGNOSIS — M79604 Pain in right leg: Secondary | ICD-10-CM | POA: Diagnosis not present

## 2023-06-05 DIAGNOSIS — M5114 Intervertebral disc disorders with radiculopathy, thoracic region: Secondary | ICD-10-CM | POA: Diagnosis not present

## 2023-06-05 DIAGNOSIS — M5104 Intervertebral disc disorders with myelopathy, thoracic region: Secondary | ICD-10-CM | POA: Diagnosis not present

## 2023-06-05 DIAGNOSIS — I10 Essential (primary) hypertension: Secondary | ICD-10-CM

## 2023-06-05 DIAGNOSIS — G8929 Other chronic pain: Secondary | ICD-10-CM

## 2023-06-05 DIAGNOSIS — G992 Myelopathy in diseases classified elsewhere: Secondary | ICD-10-CM

## 2023-06-05 DIAGNOSIS — M79605 Pain in left leg: Secondary | ICD-10-CM

## 2023-06-05 DIAGNOSIS — M4804 Spinal stenosis, thoracic region: Secondary | ICD-10-CM | POA: Diagnosis not present

## 2023-06-05 DIAGNOSIS — M5134 Other intervertebral disc degeneration, thoracic region: Secondary | ICD-10-CM

## 2023-06-05 NOTE — Progress Notes (Signed)
Today I had a follow-up phone visit with Sara Munoz, she was at home and I was in the office.  She gave consent to go forward with discussion over the phone of her thoracic disc disease with myelopathy.  We have been following her for some time, she was originally referred for placement of a spinal cord stimulator, however on further workup was found to have severe disc herniations in her mid thoracic spine.  She is also having progressive myelopathy.  We continue to evaluate her with further imaging and are now following up on the imaging and workup.  We followed up with her again today she said that she has been able to cut down on her smoking considerably, she does feel that her ambulatory issues are getting continually worse as is her pain radiating from her mid back down to her legs.  She has had a recent thoracic MRI demonstrating multilevel disc herniations with compression of the spinal cord.  Narrative & Impression  CLINICAL DATA:  Pain. Assess for stenosis prior to spinal cord stimulator placement   EXAM: MRI THORACIC SPINE WITHOUT CONTRAST   TECHNIQUE: Multiplanar, multisequence MR imaging of the thoracic spine was performed. No intravenous contrast was administered.   COMPARISON:  No prior thoracic imaging.   FINDINGS: Alignment:  No thoracic malalignment.   Vertebrae: No thoracic region fracture or focal bone lesion.   Cord:  No primary cord lesion.  See below regarding stenosis.   Paraspinal and other soft tissues: Negative   Disc levels:   Ordinary mild disc degeneration and bulging from T1-2 through T4-5.   T5-6: Normal interspace.   T6-7: Disc bulge more prominent towards the left.  No stenosis.   T7-8: Shallow disc protrusion narrows the ventral subarachnoid space but does not compress the cord. No foraminal stenosis.   T8-9: Large bilobed disc herniation, more prominent to the right of midline. Effacement of the subarachnoid space. Indentation of the ventral  cord, particularly on the right. Mild bilateral foraminal narrowing.   T9-10: Right paracentral disc herniation effaces the ventral subarachnoid space but does not affect the cord. Mild right foraminal narrowing.   T10-11: No disc abnormality.  Mild facet and ligamentous prominence.   T11-12: Mild bulging of the disc. Mild facet and ligamentous prominence.   T12-L1: Normal interspace.   IMPRESSION: 1. T8-9: Large bilobed disc herniation, more prominent to the right of midline. Effacement of the subarachnoid space. Indentation of the ventral cord, particularly on the right. Mild bilateral foraminal narrowing. 2. T9-10: Right paracentral disc herniation effaces the ventral subarachnoid space but does not affect the cord. Mild right foraminal narrowing. 3. T7-8: Shallow disc protrusion narrows the ventral subarachnoid space but does not compress the cord. 4. T6-7: Disc bulge more prominent towards the left. No stenosis.      We discussed that given the size of her disc herniations most severe at T8 as well as at T9 along with her progressive thoracic myelopathy and radiculopathy that she should have this addressed prior to any placement of spinal cord stimulator.  We would like to follow her continually to see whether or not these disc herniations were the cause of her pain, so we will hold off from placing a spinal cord stimulator at this time.  We discussed the need for decompression of her thoracic discs causing compression of her spinal cord.  This would require a transpedicular approach via the T9 and T10 pedicles, but because of the facetectomy needed to expose these levels  she would need an instrumented fusion posterior laterally as well.  We discussed this with the patient and she would like to go forward as she does feel her ambulation is getting worse and her pain is in even worse control at this time.  Will plan on a T8-T11 posterior spinal instrumented fusion, arthrodesis, and  T8-9 and T9-10 transpedicular decompression and discectomy.  Risks and benefits were discussed with the patient.  She would like to go forward with the procedure.  We spent 15 minutes discussing her care.

## 2023-06-05 NOTE — Telephone Encounter (Signed)
Refused lisinopril 20 mg because it was discontinued 03/03/2023.

## 2023-06-09 ENCOUNTER — Telehealth: Payer: Self-pay | Admitting: Neurosurgery

## 2023-06-09 DIAGNOSIS — M5134 Other intervertebral disc degeneration, thoracic region: Secondary | ICD-10-CM

## 2023-06-09 DIAGNOSIS — M4804 Spinal stenosis, thoracic region: Secondary | ICD-10-CM

## 2023-06-09 NOTE — Telephone Encounter (Signed)
Patient would like to have surgery by Dr.Smith on 07/10/2023. She is aware that you will follow-up with her in the next 24-48 hours with more information.

## 2023-06-10 ENCOUNTER — Other Ambulatory Visit: Payer: Self-pay

## 2023-06-10 DIAGNOSIS — Z01818 Encounter for other preprocedural examination: Secondary | ICD-10-CM

## 2023-06-10 NOTE — Telephone Encounter (Signed)
Planned surgery: T8-9 & T9-10 transpedicular decompression/discectomy, T8-11 posterior instrumentation and posterolateral arthrodesis   Surgery date: 07/10/23 at [facility]: [address] [place], [place] [address]) - you will find out your arrival time the business day before your surgery.   Pre-op appointment at [facility] Pre-admit Testing: we will call you with a date/time for this. If you are scheduled for an in person appointment, Pre-admit Testing is located on the first floor of the [facility], [address], [address]. Please bring all prescriptions in the original prescription bottles to your appointment. During this appointment, they will advise you which medications you can take the morning of surgery, and which medications you will need to hold for surgery. Labs (such as blood work, EKG) may be done at your pre-op appointment. You are not required to fast for these labs. Should you need to change your pre-op appointment, please call Pre-admit testing at [phone].     Blood thinners:   Aspirin:   ok to stay on aspirin 81mg         Surgical clearance: we will send a clearance form to Dr Neita Garnet. They may wish to see you in their office prior to signing the clearance form. If so, they may call you to schedule an appointment.    NSAIDS (Non-steroidal anti-inflammatory drugs): because you are having a fusion, please avoid taking any NSAIDS (examples: ibuprofen, motrin, aleve, naproxen, meloxicam, diclofenac) for 3 months after surgery. Celebrex is an exception and is OK to take, if prescribed. Tylenol is not an NSAID.    Common restrictions after surgery: No bending, lifting, or twisting ("BLT"). Avoid lifting objects heavier than 10 pounds for the first 6 weeks after surgery. Where possible, avoid household activities that involve lifting, bending, reaching, pushing, or pulling such as laundry, vacuuming,  grocery shopping, and childcare. Try to arrange for help from friends and family for these activities while you heal. Do not drive while taking prescription pain medication. Weeks 6 through 12 after surgery: avoid lifting more than 25 pounds.    X-rays after surgery: Because you are having a fusion or arthroplasty: for appointments after your 2 week follow-up: please arrive at the [facility] outpatient imaging center ([address], [place]) or [facility] one hour prior to your appointment for x-rays. This applies to every appointment after your 2 week follow-up. Failure to do so may result in your appointment being rescheduled.   How to contact us:  If you have any questions/concerns before or after surgery, you can reach [place] at [phone], or you can send a mychart message. We can be reached by phone or mychart 8am-4pm, Monday-Friday.  *Please note: Calls after 4pm are forwarded to a third party answering service. Mychart messages are not routinely monitored during evenings, weekends, and holidays. Please call our office to contact the answering service for urgent concerns during non-business hours.    If you have FMLA/disability paperwork, please drop it off or fax it to [phone], attention Patty.   Appointments/FMLA & disability paperwork: Joycelyn Rua, & Flonnie Hailstone Registered Nurse/Surgery scheduler: Royston Cowper Medical Assistants: Nash Mantis Physician Assistants: Joan Flores, [name], Manning Charity, [name] & Drake Leach, [name] Surgeons: Venetia Night, MD & Ernestine Mcmurray, MD

## 2023-06-11 ENCOUNTER — Encounter: Payer: 59 | Admitting: Neurosurgery

## 2023-06-11 ENCOUNTER — Telehealth: Payer: Self-pay | Admitting: Family Medicine

## 2023-06-11 NOTE — Telephone Encounter (Signed)
Surgical clearance form being sent back from Neurosurgery @ [place].

## 2023-06-13 ENCOUNTER — Other Ambulatory Visit: Payer: Self-pay | Admitting: Family Medicine

## 2023-06-13 DIAGNOSIS — I10 Essential (primary) hypertension: Secondary | ICD-10-CM

## 2023-06-16 ENCOUNTER — Telehealth: Payer: Self-pay | Admitting: Family Medicine

## 2023-06-16 ENCOUNTER — Other Ambulatory Visit: Payer: Self-pay | Admitting: Obstetrics and Gynecology

## 2023-06-16 NOTE — Patient Outreach (Signed)
Medicaid Managed Care   Nurse Care Manager Note  06/16/2023 Name:  Sara Munoz MRN:  [id number] DOB:  11/27/1965  Sara Munoz is an 57 y.o. year old female who is a primary patient of [name], Tawanna Cooler, MD.  The Medicaid Managed Care Coordination team was consulted for assistance with:    Chronic healthcare management needs, GERD, CAD, HTN, tobacco use, chronic pain, COPD, OSA, anxiety, osteoarthritis, HLD, PAD, PVD  Ms. Lichte was given information about Medicaid Managed Care Coordination team services today. Sara Munoz Patient agreed to services and verbal consent obtained.  Engaged with patient by telephone for follow up visit in response to provider referral for case management and/or care coordination services.   Patient is participating in a Managed Medicaid Plan:  Yes  Assessments/Interventions:  Review of past medical history, allergies, medications, health status, including review of consultants reports, laboratory and other test data, was performed as part of comprehensive evaluation and provision of chronic care management services.  SDOH (Social Drivers of Health) assessments and interventions performed: SDOH Interventions    Flowsheet Row Patient Outreach Telephone from 06/16/2023 in [place] [facility] DEPARTMENT Patient Outreach Telephone from 05/16/2023 in [place] POPULATION HEALTH DEPARTMENT Patient Outreach Telephone from 04/15/2023 in [place] POPULATION HEALTH DEPARTMENT Patient Outreach Telephone from 03/17/2023 in [place] POPULATION HEALTH DEPARTMENT Patient Outreach Telephone from 02/03/2023 in [place] POPULATION HEALTH DEPARTMENT Patient Outreach Telephone from 01/01/2023 in [place] POPULATION HEALTH DEPARTMENT  SDOH Interventions        Food Insecurity Interventions -- Intervention Not Indicated -- -- -- --  Housing Interventions -- -- -- -- Intervention Not Indicated --  Transportation Interventions -- -- -- -- --  Intervention Not Indicated  Utilities Interventions Intervention Not Indicated -- -- -- -- --  Alcohol Usage Interventions Intervention Not Indicated (Score <7) -- -- -- -- --  Financial Strain Interventions -- -- -- -- -- Intervention Not Indicated  Physical Activity Interventions -- -- Intervention Not Indicated, Other (Comments)  [not able to engage in this type of exercixe] -- -- --  Stress Interventions -- -- -- Provide Counseling, Other (Comment)  [sees therapist as scheduled] -- --  Social Connections Interventions -- -- Intervention Not Indicated -- -- --  Health Literacy Interventions -- -- -- -- Intervention Not Indicated --     Care Plan Allergies  Allergen Reactions   Cyclobenzaprine Hives, Swelling and Rash    Facial/lip swelling      Levofloxacin Hives, Swelling and Rash   Chlorpheniramine Maleate     Other reaction(s): Unknown   Phenergan [Promethazine Hcl] Other (See Comments)    Agitation.    Phenylephrine Hcl Other (See Comments)    unknown   Toradol [Ketorolac Tromethamine] Swelling and Other (See Comments)    Facial/tongue swelling    Zoloft [Sertraline Hcl] Swelling    Tongue swelling      Cephalexin Hives and Rash   Ketorolac Rash   Tramadol Hives, Swelling, Other (See Comments) and Rash    Lip swelling    Medications Reviewed Today     Reviewed by [name], RN (Registered Nurse) on 06/16/23 at 1254  Med List Status: <None>   Medication Order Taking? Sig Documenting Provider Last Dose Status Informant  amLODipine (NORVASC) 5 MG tablet [id number] No TAKE 1 TABLET BY MOUTH EVERYDAY AT BEDTIME [name], [name], MD Taking Active Self  aspirin EC 81 MG tablet [id number] No Take 81 mg by mouth in the morning. Swallow whole.  [provider] Taking Active Self  atorvastatin (LIPITOR) 40 MG tablet [id number] No Take 1 tablet (40 mg total) by mouth every evening. Debbe Odea, MD Taking Active Self  benzonatate (TESSALON) 100 MG capsule  [id number]  Take 1 capsule (100 mg total) by mouth 2 (two) times daily as needed for cough. [name], [name], MD  Active   cyclobenzaprine (FEXMID) 7.5 MG tablet [id number] No Take 7.5 mg by mouth 3 (three) times daily as needed for muscle spasms. [provider] Taking Active Self  diclofenac (CATAFLAM) 50 MG tablet [id number] No Take 50 mg by mouth every 4 (four) hours as needed (pain.). [provider] Taking Active Self  docusate sodium (COLACE) 100 MG capsule [id number] No Take 100 mg by mouth 2 (two) times daily. [provider] Taking Active Self  ELDERBERRY PO [id number] No Take 1 tablet by mouth in the morning and at bedtime. [provider] Taking Active Self  gabapentin (NEURONTIN) 600 MG tablet [id number] No Take 600 mg by mouth 4 (four) times daily. [provider] Taking Active Self  LINZESS 290 MCG CAPS capsule [id number] No Take 290 mcg by mouth in the morning. [provider] Taking Active Self  lisinopril (ZESTRIL) 40 MG tablet [id number] No Take 40 mg by mouth every evening. [provider] Taking Active Self  metoprolol tartrate (LOPRESSOR) 25 MG tablet [id number]  TAKE 1 TABLET BY MOUTH TWICE A DAY [name], [name], MD  Active   mometasone (ELOCON) 0.1 % cream [id number] No Apply 1 Application topically daily as needed (hand rash). Poggi, Excell Seltzer, MD Taking Active Self  mometasone-formoterol [facility]) 100-5 MCG/ACT Sandrea Matte [id number] No Inhale 2 puffs into the lungs every morning. Poggi, Excell Seltzer, MD Taking Active Self  Multiple Vitamins-Minerals (HAIR SKIN & NAILS PO) [id number] No Take 1 tablet by mouth in the morning and at bedtime. [provider] Taking Active Self  naloxone [facility]) 4 MG/0.1ML LIQD nasal spray kit [id number] No Place 1 spray into the nose as needed (opioid overdose). [provider] Taking Active Self           Med Note Richardean Canal May 03, 2021 11:23 AM)     nitroGLYCERIN (NITROSTAT) 0.4 MG SL tablet [id number] No Place 0.4 mg under the tongue every 5 (five) minutes as needed for chest pain. [provider] Taking Active   omeprazole (PRILOSEC) 40 MG capsule [id number] No Take 1 capsule (40 mg total) by mouth 2 (two) times daily. [name], MD Taking Active Self  ondansetron [facility]) 8 MG tablet [id number]  Take 1 tablet (8 mg total) by mouth every 8 (eight) hours as needed for nausea or vomiting. [name], [name], MD  Active   oxyCODONE (ROXICODONE) 15 MG immediate release tablet [id number] No Take 15 mg by mouth every 4 (four) hours as needed for pain. [provider] Taking Active Self  promethazine-dextromethorphan (PROMETHAZINE-DM) 6.25-15 MG/5ML syrup [id number]  Take 5 mLs by mouth 4 (four) times daily as needed for cough. [name], [name], MD  Active   sucralfate (CARAFATE) 1 GM/10ML suspension [id number] No TAKE 10 MLS (1 G TOTAL) BY MOUTH 4 (FOUR) TIMES DAILY. [name], MD Taking Active   Tiotropium Bromide Monohydrate (SPIRIVA RESPIMAT) 2.5 MCG/ACT AERS [id number] No Take 1 puff by mouth in the morning and at bedtime. Jacky Kindle, FNP Taking Active Self  TURMERIC PO [id number] No Take 1 tablet by mouth in the morning and at bedtime. [provider] Taking Active Self  VENTOLIN  HFA 108 (90 Base) MCG/ACT inhaler [id number] No USE 1 TO 2 INHALATIONS BY MOUTH  INTO THE LUNGS EVERY 6 HOURS AS  NEEDED FOR SHORTNESS OF BREATH  OR WHEEZING [name], [name], MD Taking Active Self  zolpidem (AMBIEN) 5 MG tablet [id number]  Take 1 tablet (5 mg total) by mouth at bedtime as needed for sleep. 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   History of lumbar fusion 03/18/2023   S/P cervical spinal fusion 03/18/2023   Tendinitis of upper biceps tendon of right shoulder 01/17/2023   Rotator cuff tendinitis, right 01/17/2023   Chronic fatigue 01/06/2023   Prediabetes 01/06/2023   Status post left rotator cuff repair 12/04/2022   Foreign body left in shoulder 12/04/2022   Atopic dermatitis of both hands 09/12/2022   Headache disorder 08/13/2022   Nontraumatic incomplete tear of left rotator cuff 05/28/2022   Spondylolisthesis 04/19/2022   Encounter for annual physical examination excluding gynecological examination in a patient older than 17 years 04/03/2022   Essential hypertension 03/11/2022   Lumbar spondylosis 02/12/2022   Chronic migraine w/o aura w/o status migrainosus, not intractable 07/09/2021   Obstructive sleep apnea 07/09/2021   Body mass index (BMI) 32.0-32.9, adult 06/07/2021   Pseudoarthrosis of cervical spine (HCC) 05/14/2021   Lumbar radiculopathy 05/23/2020   Sacroiliac inflammation (HCC) 11/25/2019   Sepsis (HCC) 10/19/2018   Severe sepsis (HCC) 10/19/2018   Cervical radiculopathy 02/12/2018   Chronic migraine 02/12/2018   Paresthesia 08/29/2017   Neck pain 08/29/2017   Daily headache 08/29/2017   Chronic active hepatitis (HCC) 07/29/2017   Neurogenic pain 07/31/2016   Chronic pain syndrome 06/05/2016   Carotid artery stenosis 02/27/2016   Chronic constipation 02/27/2016   Chronic nausea 02/27/2016   Hypercholesterolemia 02/27/2016   Osteoarthritis 02/27/2016   PTSD (post-traumatic stress disorder) 02/27/2016   TIA (transient ischemic attack) 02/27/2016   Long term current use of opiate analgesic 02/27/2016   Long term prescription opiate use 02/27/2016   Encounter for pain management consult 02/27/2016   Chronic hip pain (Location of Tertiary source of pain) (Bilateral) (L>R) 02/27/2016   Chronic knee pain (Bilateral) (L>R)  02/27/2016   Chronic shoulder pain (Bilateral) (L>R) 02/27/2016   Chronic sacroiliac joint pain (Bilateral) (L>R) 02/27/2016   Chronic low back pain (Location of Primary Source of Pain) (Bilateral) (L>R) 02/27/2016   Chronic lower extremity pain (Location of Secondary source of pain) (Bilateral) (L>R) 02/27/2016   Osteoarthritis of hip (Bilateral) (L>R) 02/27/2016   Chronic neck pain (posterior midline) (Bilateral) (L>R) 02/27/2016   Cervicogenic headache (Bilateral) (L>R) 02/27/2016   Occipital headache (Bilateral) (L>R) 02/27/2016   Chronic upper extremity pain (Bilateral) (L>R) 02/27/2016   Chronic Cervical radicular pain (Bilateral) (L>R) 02/27/2016   Chronic lumbar radicular pain (Right) (L5 dermatome) 02/27/2016   Lumbar facet syndrome (Bilateral) (L>R) 02/27/2016   Long term prescription benzodiazepine use 02/27/2016   Hypertension    Chronic obstructive pulmonary disease (HCC) 10/09/2015   GERD (gastroesophageal reflux disease) 10/09/2015   Non-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/2014 08/02/2015   Hyperlipidemia 08/02/2015   Costochondritis 08/02/2015   Drug abuse, 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   GAD (generalized anxiety disorder) 09/03/2014  Narcotic dependence (HCC) 09/03/2014   PVD (peripheral vascular disease) (HCC) 09/03/2014   Smoker 09/03/2014   Cigarette nicotine dependence with nicotine-induced disorder 07/08/2014   Anemia of chronic disease 03/19/2014   Polysubstance abuse (HCC) 03/19/2014   MSSA (methicillin susceptible Staphylococcus aureus) septicemia (HCC) 03/07/2014   Hypomagnesemia 01/04/2014   Chronic cough 09/04/2011   Sleep apnea, obstructive 09/04/2011   Sinusitis, acute 09/04/2011   Conditions to be addressed/monitored per PCP order:  Chronic healthcare management needs, GERD, CAD, HTN, tobacco use, chronic pain, COPD, OSA, anxiety, osteoarthritis, HLD, PAD, PVD  Care Plan : RN Care Manager Plan of Care  Updates made by [name], RN since 06/16/2023 12:00 AM      Problem: Health Promotion or Disease Self-Management (General Plan of Care)      Long-Range Goal: Chronic Disease Management   Start Date: 09/12/2022  Expected End Date: 09/14/2023  Priority: High  Note:   Current Barriers:  Knowledge Deficits related to plan of care for management of GERD, CAD, HTN, PVD, TIA, migranes, COPD, OSA, tobacco use, osteoarthritis, chronic pain, anxiety  Chronic Disease Management support and education needs related to  GERD, CAD, HTN, PVD, TIA, migranes, COPD, OSA, tobacco use, osteoarthritis, chronic pain, anxiety  06/16/23:  Patient to have surgery 1/16 for herniated discs-has decreased smoking to 1/2 ppd.  Ongoing neck, back, and leg pain-patient hope surgery will be helpful.  Checks BP occasionally-last reading 135/93 per patient-to monitor.  136/76 at PCP appt 12/10.    RNCM Clinical Goal(s):  Patient will verbalize understanding of plan for management of GERD, CAD, HTN, PVD, TIA, migranes, COPD, OSA, tobacco use, osteoarthritis, chronic pain, anxiety   as evidenced by patient report verbalize basic understanding of  GERD, CAD, HTN, PVD, TIA, migranes, COPD, OSA, tobacco use, osteoarthritis, chronic pain, anxiety  disease process and self health management plan as evidenced by patient report take all medications exactly as prescribed and will call provider for medication related questions as evidenced by patient report  demonstrate understanding of rationale for each prescribed medication as evidenced by patient report attend all scheduled medical appointments as evidenced by patient report and EMR review demonstrate Ongoing adherence to prescribed treatment plan for  GERD, CAD, HTN, PVD, TIA, migranes, COPD, OSA, tobacco use, osteoarthritis, chronic pain, anxiety as evidenced by patient report continue to work with RN Care Manager to address care management and care coordination needs related to GERD, CAD, HTN, PVD, TIA, migranes, COPD, OSA, tobacco use,  osteoarthritis, chronic pain, anxiety  as evidenced by adherence to CM Team Scheduled appointments through collaboration with RN Care manager, provider, and care team.   Interventions: Inter-disciplinary care team collaboration (see longitudinal plan of care) Evaluation of current treatment plan related to  self management and patient's adherence to plan as established by provider 01/03/23-discussed protein drink supplementation, MVI  CAD Interventions: (Status:  New goal.) Long Term Goal Assessed understanding of CAD diagnosis Medications reviewed including medications utilized in CAD treatment plan Counseled on importance of regular laboratory monitoring as prescribed Reviewed Importance of taking all medications as prescribed Reviewed Importance of attending all scheduled provider appointments Advised to report any changes in symptoms or exercise tolerance Screening for signs and symptoms of depression related to chronic disease state Assessed social determinant of health barriers  COPD Interventions:  (Status:  New goal.) Long Term Goal Advised patient to self assesses COPD action plan zone and make appointment with provider if in the yellow zone for 48 hours without improvement Advised patient  to engage in light exercise as tolerated 3-5 days a week to aid in the the management of COPD Provided education about and advised patient to utilize infection prevention strategies to reduce risk of respiratory infection Discussed the importance of adequate rest and management of fatigue with COPD Assessed social determinant of health barriers  Hypertension Interventions:  (Status:  New goal.) Long Term Goal Last practice recorded BP readings:  BP Readings from Last 3 Encounters:        05/16/23 117-140/94  06/16/23         135/93 04/15/23         117/80  Most recent eGFR/CrCl:  Lab Results  Component Value Date   EGFR 83 04/08/2022    No components found for: "CRCL"  Evaluation of  current treatment plan related to hypertension self management and patient's adherence to plan as established by provider Reviewed medications with patient and discussed importance of compliance Discussed plans with patient for ongoing care management follow up and provided patient with direct contact information for care management team Advised patient, providing education and rationale, to monitor blood pressure daily and record, calling PCP for findings outside established parameters Reviewed scheduled/upcoming provider appointments including:  Discussed complications of poorly controlled blood pressure such as heart disease, stroke, circulatory complications, vision complications, kidney impairment, sexual dysfunction Assessed social determinant of health barriers  Pain Interventions:  (Status:  New goal.) Long Term Goal Pain assessment performed Medications reviewed Reviewed provider established plan for pain management Discussed importance of adherence to all scheduled medical appointments Counseled on the importance of reporting any/all new or changed pain symptoms or management strategies to pain management provider Advised patient to report to care team affect of pain on daily activities Reviewed with patient prescribed pharmacological and nonpharmacological pain relief strategies Assessed social determinant of health barriers  Patient Goals/Self-Care Activities: Take all medications as prescribed Attend all scheduled provider appointments Call pharmacy for medication refills 3-7 days in advance of running out of medications Perform all self care activities independently  Perform IADL's (shopping, preparing meals, housekeeping, managing finances) independently Call provider office for new concerns or questions  03/17/23:  To reschedule GI appt-completed 05/16/23:  Schedule Neurosurgery appt  Follow Up Plan:  The patient has been provided with contact information for the care  management team and has been advised to call with any health related questions or concerns.  The care management team will reach out to the patient again over the next 45 business  days.   Follow Up:  Patient agrees to Care Plan and Follow-up.  Plan: The Managed Medicaid care management team will reach out to the patient again over the next 30 business  days. and The  Patient has been provided with contact information for the Managed Medicaid care management team and has been advised to call with any health related questions or concerns.  Date/time of next scheduled RN care management/care coordination outreach: 07/17/23 at 1230.

## 2023-06-16 NOTE — Telephone Encounter (Signed)
Patient has been scheduled for a pre-op clearance on 06/23/23

## 2023-06-16 NOTE — Telephone Encounter (Signed)
I spoke to patient and she is scheduled for a virtual pre-op clearance on 06/23/23 at 2:20.

## 2023-06-16 NOTE — Patient Instructions (Signed)
Hi Sara Munoz, I hope things go well with your surgery.  Have a Merry Christmas!!  Sara Munoz was given information about Medicaid Managed Care team care coordination services as a part of their [place] benefit. [name] verbally consented to engagement with [facility]aged Care team.   If you are experiencing a medical emergency, please call 911 or report to your local emergency department or urgent care.   If you have a non-emergency medical problem during routine business hours, please contact your provider's office and ask to speak with a nurse.   For questions related to your [place] health plan, please call: [phone]  If you would like to schedule transportation through your [place] plan, please call Sara following number at least 2 days in advance of your appointment: [phone].   There is no limit to Sara number of trips during Sara year between medical appointments, healthcare facilities, or pharmacies. Transportation must be scheduled at least 2 business days before but not more than thirty 30 days before of your appointment.  Call [facility] Crisis Line at [phone], at any time, 24 hours a day, 7 days a week. If you are in danger or need immediate medical attention call 911.  If you would like help to quit smoking, call 1-800-QUIT-NOW (423-499-4306) OR Espaol: 1-855-Djelo-Ya (0-623-762-8315) o para ms informacin haga clic aqu or Text READY to 176-160 to register via text  Sara Munoz - following are Sara goals we discussed in your visit today:  Timeframe:  Long-Range Goal Priority:  High Start Date:       12/20/20                      Expected End Date:   ongoing     Follow up date:  07/17/23  - schedule appointment for flu shot - schedule appointment for vaccines needed due to my age or health - schedule recommended health tests (blood work, mammogram, colonoscopy, pap test) - schedule  and keep appointment for annual check-up   Why is this important?   Screening tests can find diseases early when they are easier to treat.  Your doctor or nurse will talk with you about which tests are important for you.  Getting shots for common diseases like Sara flu and shingles will help prevent them.   06/16/23:  Surgery 1/16 for herniated discs.  GI and CARDS appt in Jan.  Recent PCP appt.  Munoz verbalizes understanding of instructions and care plan provided today and agrees to view in MyChart. Active MyChart status and Munoz understanding of how to access instructions and care plan via MyChart confirmed with Munoz.     Sara Managed Medicaid care management team will reach out to Sara Munoz again over Sara next 30 business  days.  Sara  Munoz  has been provided with contact information for [place] Managed Medicaid care management team and has been advised to call with any health related questions or concerns.   Kathi Der RN, BSN [place]  [facility] Care Management Coordinator - Managed Medicaid High Risk [phone]   Following is a copy of your plan of care:  Care Plan : RN Care Manager Plan of Care  Updates made by [name], RN since 06/16/2023 12:00 AM     Problem: Health Promotion or Disease Self-Management (General Plan of Care)      Long-Range Goal: Chronic Disease Management   Start Date: 09/12/2022  Expected End Date: 09/14/2023  Priority: High  Note:   Current Barriers:  Knowledge Deficits related to plan of care for management of GERD, CAD, HTN, PVD, TIA, migranes, COPD, OSA, tobacco use, osteoarthritis, chronic pain, anxiety  Chronic Disease Management support and education needs related to  GERD, CAD, HTN, PVD, TIA, migranes, COPD, OSA, tobacco use, osteoarthritis, chronic pain, anxiety  06/16/23:  Munoz to have surgery 1/16 for herniated discs-has decreased smoking to 1/2 ppd.  Ongoing neck, back, and leg pain-Munoz hope surgery  will be helpful.  Checks BP occasionally-last reading 135/93 per Munoz-to monitor.  136/76 at PCP appt 12/10.    RNCM Clinical Goal(s):  Munoz will verbalize understanding of plan for management of GERD, CAD, HTN, PVD, TIA, migranes, COPD, OSA, tobacco use, osteoarthritis, chronic pain, anxiety   as evidenced by Munoz report verbalize basic understanding of  GERD, CAD, HTN, PVD, TIA, migranes, COPD, OSA, tobacco use, osteoarthritis, chronic pain, anxiety  disease process and self health management plan as evidenced by Munoz report take all medications exactly as prescribed and will call provider for medication related questions as evidenced by Munoz report  demonstrate understanding of rationale for each prescribed medication as evidenced by Munoz report attend all scheduled medical appointments as evidenced by Munoz report and EMR review demonstrate Ongoing adherence to prescribed treatment plan for  GERD, CAD, HTN, PVD, TIA, migranes, COPD, OSA, tobacco use, osteoarthritis, chronic pain, anxiety as evidenced by Munoz report continue to work with RN Care Manager to address care management and care coordination needs related to GERD, CAD, HTN, PVD, TIA, migranes, COPD, OSA, tobacco use, osteoarthritis, chronic pain, anxiety  as evidenced by adherence to CM Team Scheduled appointments through collaboration with RN Care manager, provider, and care team.   Interventions: Inter-disciplinary care team collaboration (see longitudinal plan of care) Evaluation of current treatment plan related to  self management and Munoz's adherence to plan as established by provider 01/03/23-discussed protein drink supplementation, MVI  CAD Interventions: (Status:  New goal.) Long Term Goal Assessed understanding of CAD diagnosis Medications reviewed including medications utilized in CAD treatment plan Counseled on importance of regular laboratory monitoring as prescribed Reviewed Importance of taking  all medications as prescribed Reviewed Importance of attending all scheduled provider appointments Advised to report any changes in symptoms or exercise tolerance Screening for signs and symptoms of depression related to chronic disease state Assessed social determinant of health barriers  COPD Interventions:  (Status:  New goal.) Long Term Goal Advised Munoz to self assesses COPD action plan zone and make appointment with provider if in Sara yellow zone for 48 hours without improvement Advised Munoz to engage in light exercise as tolerated 3-5 days a week to aid in Sara Sara management of COPD Provided education about and advised Munoz to utilize infection prevention strategies to reduce risk of respiratory infection Discussed Sara importance of adequate rest and management of fatigue with COPD Assessed social determinant of health barriers  Hypertension Interventions:  (Status:  New goal.) Long Term Goal Last practice recorded BP readings:  BP Readings from Last 3 Encounters:        05/16/23 117-140/94  06/16/23         135/93 04/15/23         117/80  Most recent eGFR/CrCl:  Lab Results  Component Value Date   EGFR 83 04/08/2022    No components found for: "CRCL"  Evaluation of current treatment plan related to hypertension self management and Munoz's adherence to plan as  established by provider Reviewed medications with Munoz and discussed importance of compliance Discussed plans with Munoz for ongoing care management follow up and provided Munoz with direct contact information for care management team Advised Munoz, providing education and rationale, to monitor blood pressure daily and record, calling PCP for findings outside established parameters Reviewed scheduled/upcoming provider appointments including:  Discussed complications of poorly controlled blood pressure such as heart disease, stroke, circulatory complications, vision complications, kidney impairment,  sexual dysfunction Assessed social determinant of health barriers  Pain Interventions:  (Status:  New goal.) Long Term Goal Pain assessment performed Medications reviewed Reviewed provider established plan for pain management Discussed importance of adherence to all scheduled medical appointments Counseled on Sara importance of reporting any/all new or changed pain symptoms or management strategies to pain management provider Advised Munoz to report to care team affect of pain on daily activities Reviewed with Munoz prescribed pharmacological and nonpharmacological pain relief strategies Assessed social determinant of health barriers  Munoz Goals/Self-Care Activities: Take all medications as prescribed Attend all scheduled provider appointments Call pharmacy for medication refills 3-7 days in advance of running out of medications Perform all self care activities independently  Perform IADL's (shopping, preparing meals, housekeeping, managing finances) independently Call provider office for new concerns or questions  03/17/23:  To reschedule GI appt-completed 05/16/23:  Schedule Neurosurgery appt  Follow Up Plan:  Sara Munoz has been provided with contact information for Sara care management team and has been advised to call with any health related questions or concerns.  Sara care management team will reach out to Sara Munoz again over Sara next 45 business  days.

## 2023-06-16 NOTE — Telephone Encounter (Signed)
Please schedule for preoperative visit for 07/10/23 surgery   Virtual is ok

## 2023-06-17 ENCOUNTER — Ambulatory Visit: Payer: 59 | Admitting: Neurology

## 2023-06-23 ENCOUNTER — Telehealth: Payer: 59 | Admitting: Family Medicine

## 2023-06-24 ENCOUNTER — Telehealth: Payer: Self-pay | Admitting: Family Medicine

## 2023-06-24 NOTE — Telephone Encounter (Signed)
 Lauren with Neurosurgery of  is needing this patient seen by Jan. 9th for her preop evaluation and form to be completed and faxed back to her.  There are no appts open with Dr. Lang unless she works the patient in.  Please let Lauren know when you can see this patient. [phone]

## 2023-06-27 ENCOUNTER — Other Ambulatory Visit: Payer: Self-pay

## 2023-06-27 ENCOUNTER — Encounter
Admission: RE | Admit: 2023-06-27 | Discharge: 2023-06-27 | Disposition: A | Discharge status: Home / Self Care | Payer: 59 | Source: Ambulatory Visit | Attending: Neurosurgery | Admitting: Neurosurgery

## 2023-06-27 DIAGNOSIS — Z01812 Encounter for preprocedural laboratory examination: Secondary | ICD-10-CM | POA: Diagnosis present

## 2023-06-27 LAB — SURGICAL PCR SCREEN
MRSA, PCR: NEGATIVE
Staphylococcus aureus: NEGATIVE

## 2023-06-27 LAB — TYPE AND SCREEN
ABO/RH(D): O POS
Antibody Screen: NEGATIVE

## 2023-06-27 NOTE — Telephone Encounter (Signed)
 Form has been signed and placed in fax folder in front office

## 2023-06-27 NOTE — Telephone Encounter (Signed)
 Forms have been faxed

## 2023-06-27 NOTE — Patient Instructions (Addendum)
 Your procedure is scheduled on: Thursday, January 16 Report to the Registration Desk on the 1st floor of the [facility] To find out your arrival time, please call [phone] between 1PM - 3PM on: Wednesday, January 15 If your arrival time is 6:00 am, do not arrive before that time as the [facility] entrance doors do not open until 6:00 am.  REMEMBER: Instructions that are not followed completely may result in serious medical risk, up to and including death; or upon the discretion of your surgeon and anesthesiologist your surgery may need to be rescheduled.  Do not eat food after midnight the night before surgery.  No gum chewing or hard candies.  You may however, drink CLEAR liquids up to 2 hours before you are scheduled to arrive for your surgery. Do not drink anything within 2 hours of your scheduled arrival time.  Clear liquids include: - water   - apple juice without pulp - gatorade (not RED colors) - black coffee or tea (Do NOT add milk or creamers to the coffee or tea) Do NOT drink anything that is not on this list.  One week prior to surgery: starting January 9 Stop ANY OVER THE COUNTER vitamins, minerals, and supplements until after surgery.  You may however, continue to take Tylenol  if needed for pain up until the day of surgery.  Aspirin :   ok to stay on aspirin  81mg    NSAIDS (Non-steroidal anti-inflammatory drugs): because you are having a fusion, please avoid taking any NSAIDS (examples: ibuprofen , motrin , aleve , naproxen , meloxicam , diclofenac) for 3 months after surgery. Celebrex is an exception and is OK to take, if prescribed. Tylenol  is not an NSAID.   Continue taking all of your other prescription medications up until the day of surgery.  ON THE DAY OF SURGERY ONLY TAKE THESE MEDICATIONS WITH SIPS OF WATER :  Gabapentin  Metoprolol  Dulera  inhaler Omeprazole  (Prilosec) Oxycodone  if needed for pain Spiriva  inhaler Incruse ellipta  inhaler  Use inhalers on  the day of surgery and bring your albuterol  inhaler to the hospital.  No Alcohol  for 24 hours before or after surgery.  No Smoking including e-cigarettes for 24 hours before surgery.  No chewable tobacco products for at least 6 hours before surgery.  No nicotine  patches on the day of surgery.  Do not use any recreational drugs for at least a week (preferably 2 weeks) before your surgery.  Please be advised that the combination of cocaine and anesthesia may have negative outcomes, up to and including death. If you test positive for cocaine, your surgery will be cancelled.  On the morning of surgery brush your teeth with toothpaste and water , you may rinse your mouth with mouthwash if you wish. Do not swallow any toothpaste or mouthwash.  Use CHG Soap as directed on instruction sheet.  Do not wear jewelry, make-up, hairpins, clips or nail polish.  For welded (permanent) jewelry: bracelets, anklets, waist bands, etc.  Please have this removed prior to surgery.  If it is not removed, there is a chance that hospital personnel will need to cut it off on the day of surgery.  Do not wear lotions, powders, or perfumes.   Do not shave body hair from the neck down 48 hours before surgery.  Contact lenses, hearing aids and dentures may not be worn into surgery.  Do not bring valuables to the hospital. [facility] is not responsible for any missing/lost belongings or valuables.   Notify your doctor if there is any change in your medical  condition (cold, fever, infection).  Wear comfortable clothing (specific to your surgery type) to the hospital.  After surgery, you can help prevent lung complications by doing breathing exercises.  Take deep breaths and cough every 1-2 hours. Your doctor may order a device called an Incentive Spirometer to help you take deep breaths.  If you are being admitted to the hospital overnight, leave your suitcase in the car. After surgery it may be brought to your  room.  In case of increased patient census, it may be necessary for you, the patient, to continue your postoperative care in the Same Day Surgery department.  If you are being discharged the day of surgery, you will not be allowed to drive home. You will need a responsible individual to drive you home and stay with you for 24 hours after surgery.   If you are taking public transportation, you will need to have a responsible individual with you.  Please call the Pre-admissions Testing Dept. at [phone] if you have any questions about these instructions.  Surgery Visitation Policy:  Patients having surgery or a procedure may have two visitors.  Children under the age of 43 must have an adult with them who is not the patient.  Inpatient Visitation:    Visiting hours are 7 a.m. to 8 p.m. Up to four visitors are allowed at one time in a patient room. The visitors may rotate out with other people during the day.  One visitor age 59 or older may stay with the patient overnight and must be in the room by 8 p.m.    Pre-operative 5 CHG Bath Instructions   You can play a key role in reducing the risk of infection after surgery. Your skin needs to be as free of germs as possible. You can reduce the number of germs on your skin by washing with CHG (chlorhexidine  gluconate) soap before surgery. CHG is an antiseptic soap that kills germs and continues to kill germs even after washing.   DO NOT use if you have an allergy to chlorhexidine /CHG or antibacterial soaps. If your skin becomes reddened or irritated, stop using the CHG and notify one of our RNs at [phone].   Please shower with the CHG soap starting 4 days before surgery using the following schedule:     Please keep in mind the following:  DO NOT shave, including legs and underarms, starting the day of your first shower.   You may shave your face at any point before/day of surgery.  Place clean sheets on your bed the day you  start using CHG soap. Use a clean washcloth (not used since being washed) for each shower. DO NOT sleep with pets once you start using the CHG.   CHG Shower Instructions:  If you choose to wash your hair and private area, wash first with your normal shampoo/soap.  After you use shampoo/soap, rinse your hair and body thoroughly to remove shampoo/soap residue.  Turn the water  OFF and apply about 3 tablespoons (45 ml) of CHG soap to a CLEAN washcloth.  Apply CHG soap ONLY FROM YOUR NECK DOWN TO YOUR TOES (washing for 3-5 minutes)  DO NOT use CHG soap on face, private areas, open wounds, or sores.  Pay special attention to the area where your surgery is being performed.  If you are having back surgery, having someone wash your back for you may be helpful. Wait 2 minutes after CHG soap is applied, then you may rinse off the CHG  soap.  Pat dry with a clean towel  Put on clean clothes/pajamas   If you choose to wear lotion, please use ONLY the CHG-compatible lotions on the back of this paper.     Additional instructions for the day of surgery: DO NOT APPLY any lotions, deodorants, cologne, or perfumes.   Put on clean/comfortable clothes.  Brush your teeth.  Ask your nurse before applying any prescription medications to the skin.      CHG Compatible Lotions   Aveeno Moisturizing lotion  Cetaphil Moisturizing Cream  Cetaphil Moisturizing Lotion  Clairol Herbal Essence Moisturizing Lotion, Dry Skin  Clairol Herbal Essence Moisturizing Lotion, Extra Dry Skin  Clairol Herbal Essence Moisturizing Lotion, Normal Skin  Curel Age Defying Therapeutic Moisturizing Lotion with Alpha Hydroxy  Curel Extreme Care Body Lotion  Curel Soothing Hands Moisturizing Hand Lotion  Curel Therapeutic Moisturizing Cream, Fragrance-Free  Curel Therapeutic Moisturizing Lotion, Fragrance-Free  Curel Therapeutic Moisturizing Lotion, Original Formula  Eucerin Daily Replenishing Lotion  Eucerin Dry Skin Therapy  Plus Alpha Hydroxy Crme  Eucerin Dry Skin Therapy Plus Alpha Hydroxy Lotion  Eucerin Original Crme  Eucerin Original Lotion  Eucerin Plus Crme Eucerin Plus Lotion  Eucerin TriLipid Replenishing Lotion  Keri Anti-Bacterial Hand Lotion  Keri Deep Conditioning Original Lotion Dry Skin Formula Softly Scented  Keri Deep Conditioning Original Lotion, Fragrance Free Sensitive Skin Formula  Keri Lotion Fast Absorbing Fragrance Free Sensitive Skin Formula  Keri Lotion Fast Absorbing Softly Scented Dry Skin Formula  Keri Original Lotion  Keri Skin Renewal Lotion Keri Silky Smooth Lotion  Keri Silky Smooth Sensitive Skin Lotion  Nivea Body Creamy Conditioning Oil  Nivea Body Extra Enriched Teacher, Adult Education Moisturizing Lotion Nivea Crme  Nivea Skin Firming Lotion  NutraDerm 30 Skin Lotion  NutraDerm Skin Lotion  NutraDerm Therapeutic Skin Cream  NutraDerm Therapeutic Skin Lotion  ProShield Protective Hand Cream  Provon moisturizing lotion

## 2023-06-27 NOTE — Telephone Encounter (Signed)
 No preop appt needed  Please notify pt to cancel

## 2023-06-27 NOTE — Telephone Encounter (Signed)
 Advised patient no appt needed.

## 2023-06-27 NOTE — Progress Notes (Signed)
 Medical Clearance on chart from 06-26-23-Moderate Risk  Quentin Mulling NP has note in Epic (cardiac clearance) from previous surgery on 04-18-23

## 2023-07-07 ENCOUNTER — Other Ambulatory Visit: Payer: Self-pay

## 2023-07-08 NOTE — Progress Notes (Deleted)
 Brigitte Canard, [name] [address]  [address]  [place], [place] [address]  Main: [phone]  Fax: [phone]   Primary Care Physician: Mimi Alt, MD  Primary Gastroenterologist:  ***  CC:  F/U iron  deficiency anemia, Constipation, Abd Pain  HPI: Sara Munoz is a 58 y.o. female returns for 49-month follow-up of anemia, constipation, abdominal pain.  Takes oxycodone  for chronic pain.  Zofran  as needed for nausea.  Carafate .  Linzess  290 once daily.  Lactulose  30 mL twice daily.  Colace 100 mg once daily.  03/2023 labs: Celiac panel negative, normal B12 and folate.  Hemoglobin 11.3.  Low iron  saturation 11, ferritin 11, total iron  44.  04/21/2023 EGD by Dr. Baldomero Bone: Mild gastritis, otherwise normal.  04/21/2023 colonoscopy: Poor prep.  Stool in entire colon.  No specimens collected.  Recommended repeat colonoscopy with 2-day prep, next available within 3 months.    GI history: -05/2016 for hepatitis C GT 3  Completed treatment in 08/2017 and attained SVR. constipation, hepatic steatosis, gastritis.  Upper GI bleed secondary to BC powder use.  -CT abdomen 03/17/17- increased stool burden and hepatic steatosis.   -Colonoscopy 08/2016 - small adenomas excised  -RUQ USG 05/07/16 showed hepatic steatosis.  -Yes 09/18/17 : EGD: normal appearance but bx showed mild active gastritis and negative for H pylori .  -02/25/18: H pylori breath test negative   -10/19/2018: CT scan of the abdomenPerinephric fat stranding was seen -07/27/2019: Seen at the office complaint of melena was taking BC powders for headaches for years.  -08/10/2019 EGD: Localized inflammation seen in the antrum suggestive of gastritis biopsies taken that demonstrated reactive gastropathy.  Negative for H. pylori. -07/11/2021: She had resume taking BC powders was having abdominal pain similar to the past, was having constipation, was smoking was scheduled for an endoscopy but canceled the procedure Current  Outpatient Medications  Medication Sig Dispense Refill   amLODipine  (NORVASC ) 5 MG tablet TAKE 1 TABLET BY MOUTH EVERYDAY AT BEDTIME 90 tablet 0   aspirin  EC 81 MG tablet Take 81 mg by mouth in the morning. Swallow whole.     atorvastatin  (LIPITOR) 40 MG tablet Take 1 tablet (40 mg total) by mouth every evening. 90 tablet 3   diclofenac (CATAFLAM) 50 MG tablet Take 50-100 mg by mouth every 4 (four) hours as needed (pain.).     docusate sodium  (COLACE) 100 MG capsule Take 100 mg by mouth in the morning.     gabapentin  (NEURONTIN ) 600 MG tablet Take 600 mg by mouth 4 (four) times daily.     LINZESS  290 MCG CAPS capsule Take 290 mcg by mouth daily as needed (constipation.).     lisinopril  (ZESTRIL ) 40 MG tablet Take 40 mg by mouth every evening.     metoprolol  tartrate (LOPRESSOR ) 25 MG tablet TAKE 1 TABLET BY MOUTH TWICE A DAY 180 tablet 3   mometasone  (ELOCON ) 0.1 % cream Apply 1 Application topically daily as needed (hand rash).     naloxone  (NARCAN ) 4 MG/0.1ML LIQD nasal spray kit Place 1 spray into the nose as needed (opioid overdose).     nitroGLYCERIN  (NITROSTAT ) 0.4 MG SL tablet Place 0.4 mg under the tongue every 5 (five) minutes as needed for chest pain.     omeprazole  (PRILOSEC) 40 MG capsule Take 1 capsule (40 mg total) by mouth 2 (two) times daily. 180 capsule 3   ondansetron  (ZOFRAN ) 8 MG tablet Take 1 tablet (8 mg total) by mouth every 8 (eight) hours as  needed for nausea or vomiting. 90 tablet 1   oxyCODONE  (ROXICODONE ) 15 MG immediate release tablet Take 15 mg by mouth every 4 (four) hours as needed for pain.     sucralfate  (CARAFATE ) 1 GM/10ML suspension TAKE 10 MLS (1 G TOTAL) BY MOUTH 4 (FOUR) TIMES DAILY. 840 mL 0   Tiotropium Bromide  Monohydrate (SPIRIVA  RESPIMAT) 2.5 MCG/ACT AERS Take 1 puff by mouth in the morning and at bedtime. (Patient taking differently: Take 1 puff by mouth in the morning.) 12 g 11   umeclidinium bromide  (INCRUSE ELLIPTA ) 62.5 MCG/ACT AEPB Inhale 1 puff  into the lungs in the morning.     VENTOLIN  HFA 108 (90 Base) MCG/ACT inhaler USE 1 TO 2 INHALATIONS BY MOUTH  INTO THE LUNGS EVERY 6 HOURS AS  NEEDED FOR SHORTNESS OF BREATH  OR WHEEZING 72 g 2   zolpidem  (AMBIEN ) 5 MG tablet Take 1 tablet (5 mg total) by mouth at bedtime as needed for sleep. 30 tablet 0   No current facility-administered medications for this visit.    Allergies as of 07/09/2023 - Review Complete 06/27/2023  Allergen Reaction Noted   Cyclobenzaprine  Hives, Swelling, and Rash 11/13/2013   Levofloxacin Hives, Swelling, and Rash 02/27/2012   Chlorpheniramine maleate  02/12/2022   Phenergan  [promethazine  hcl] Other (See Comments) 02/21/2017   Phenylephrine  hcl Other (See Comments) 02/12/2022   Toradol  [ketorolac  tromethamine ] Swelling and Other (See Comments) 01/17/2015   Zoloft [sertraline hcl] Swelling 05/31/2015   Cephalexin  Hives and Rash 10/22/2010   Ketorolac  Rash 10/22/2010   Tramadol Hives, Swelling, Other (See Comments), and Rash 01/17/2015    Past Medical History:  Diagnosis Date   Anemia    Anxiety    Aortic atherosclerosis (HCC)    Asthma    Atypical chest pain 08/02/2015   Carotid artery disease (HCC)    Chronic back pain    Chronic nausea    Chronic, continuous use of opioids 02/27/2016   a.) has naloxone  Rx available   COPD (chronic obstructive pulmonary disease) (HCC)    Coronary artery disease    a.) LHC 2011: no sig CAD (report unavailable); b.) LHC 07/07/2014: 10-20% mLM, 25% LAD, 30-40% LCx, 20-30% RCA -- med mgmt; c.) LHC 02/22/2016: 25% LM, 25% pLCx --> med mgmt; d.) MPI 07/10/2017: no ischemia; e.) MPI 09/01/2019: no ischemia; f.) cCTA 10/28/2022: Ca score 1092 (99th percentile for age/sex/race match control)   Current smoker    DDD (degenerative disc disease), cervical    a.) s/p ACDF C5-C7 and PSIF C5-T1   DDD (degenerative disc disease), lumbar 05/23/2020   a.) s/p L5-S1 PLIF   GERD (gastroesophageal reflux disease)    Hepatic  steatosis    Hepatitis C virus infection cured after antiviral drug therapy    a.) s/p Tx with standard glecaprevir-pibrentasvir course   Hypercholesteremia    Hyperkalemia    Hyperlipidemia    Hypertension    Hypokalemia    Hypomagnesemia 01/04/2014   Incomplete tear of left rotator cuff    Insomnia    a.) on hypnotic PRN (zolpidem )   MI (myocardial infarction) (HCC) 2012   Migraines    MSSA (methicillin susceptible Staphylococcus aureus) septicemia (HCC) 03/03/2014   a.) TEE (-) for valvular vegitation   Nerve pain    per patient, in the lower back   Osteoarthritis    Pneumonia    PTSD (post-traumatic stress disorder)    PVD (peripheral vascular disease) (HCC)    Tendinitis of left rotator cuff  TIA (transient ischemic attack) 2014    Past Surgical History:  Procedure Laterality Date   ANTERIOR CERVICAL DECOMP/DISCECTOMY FUSION N/A 03/30/2018   Procedure: Cervical six-seven Anterior cervical decompression/discectomy/fusion;  Surgeon: Cannon Champion, MD;  Location: [facility] OR;  Service: Neurosurgery;  Laterality: N/A;   ANTERIOR CERVICAL DECOMP/DISCECTOMY FUSION N/A 01/11/2019   Procedure: Cervical Five-Six Anterior cervical discectomy fusion,  Cervical Five to Cervical Seven anterior instrumented fusion;  Surgeon: Cannon Champion, MD;  Location: [facility] OR;  Service: Neurosurgery;  Laterality: N/A;  Cervical Five-Six Anterior cervical discectomy fusion,  Cervical Five to Cervical Seven anterior instrumented fusion   BIOPSY  04/21/2023   Procedure: BIOPSY;  Surgeon: Selena Daily, MD;  Location: [facility] ENDOSCOPY;  Service: Gastroenterology;;   CARDIAC CATHETERIZATION Left 02/22/2016   Procedure: Left Heart Cath and Coronary Angiography;  Surgeon: Antonette Batters, MD;  Location: [facility] INVASIVE CV [facility];  Service: Cardiovascular;  Laterality: Left;   CATARACT EXTRACTION W/ INTRAOCULAR LENS IMPLANT Right    COLONOSCOPY WITH PROPOFOL  N/A 09/19/2016   Procedure: COLONOSCOPY WITH  PROPOFOL ;  Surgeon: Luke Salaam, MD;  Location: [facility] ENDOSCOPY;  Service: Endoscopy;  Laterality: N/A;   COLONOSCOPY WITH PROPOFOL  N/A 04/21/2023   Procedure: COLONOSCOPY WITH PROPOFOL ;  Surgeon: Selena Daily, MD;  [facility];  Service: Gastroenterology;  Laterality: N/A;   DIAGNOSTIC LAPAROSCOPY     ESOPHAGOGASTRODUODENOSCOPY (EGD) WITH PROPOFOL  N/A 09/18/2017   Procedure: ESOPHAGOGASTRODUODENOSCOPY (EGD) WITH PROPOFOL ;  Surgeon: Luke Salaam, MD;  Location: [facility] ENDOSCOPY;  Service: Gastroenterology;  Laterality: N/A;   ESOPHAGOGASTRODUODENOSCOPY (EGD) WITH PROPOFOL  N/A 08/10/2019   Procedure: ESOPHAGOGASTRODUODENOSCOPY (EGD) WITH PROPOFOL ;  Surgeon: Luke Salaam, MD;  Location: [facility] ENDOSCOPY;  Service: Endoscopy;  Laterality: N/A;   ESOPHAGOGASTRODUODENOSCOPY (EGD) WITH PROPOFOL  N/A 04/21/2023   Procedure: ESOPHAGOGASTRODUODENOSCOPY (EGD) WITH PROPOFOL ;  Surgeon: Selena Daily, MD;  Location: [facility] ENDOSCOPY;  Service: Gastroenterology;  Laterality: N/A;   HEMORRHOID SURGERY     LEFT HEART CATH AND CORONARY ANGIOGRAPHY Left 07/07/2014   LEFT HEART CATH AND CORONARY ANGIOGRAPHY Left 2011   LUMBAR WOUND DEBRIDEMENT N/A 11/05/2022   Procedure: Thoracic one spinous process removal and wound revision;  Surgeon: Cannon Champion, MD;  Location: [facility] OR;  Service: Neurosurgery;  Laterality: N/A;   NASAL SINUS SURGERY     ORIF FEMUR FRACTURE Left    ORIF TIBIA & FIBULA FRACTURES Right    OVARIAN CYST SURGERY     POSTERIOR CERVICAL FUSION/FORAMINOTOMY N/A 05/14/2021   Procedure: Cervical five, Cervical six Laminectomy,foraminotomy with Cervical five-six Cervical six-seven Cervical seven-Thoracic one Posterior instrumented fusion;  Surgeon: Cannon Champion, MD;  Location: [facility] OR;  Service: Neurosurgery;  Laterality: N/A;   SHOULDER ARTHROSCOPY WITH SUBACROMIAL DECOMPRESSION, ROTATOR CUFF REPAIR AND BICEP TENDON REPAIR Left 05/28/2022   Procedure: SHOULDER ARTHROSCOPY WITH  DEBRIDEMENT, DECOMPRESSION, ROTATOR CUFF REPAIR AND BICEP TENDON REPAIR;  Surgeon: Elner Hahn, MD;  Location: [facility] ORS;  Service: Orthopedics;  Laterality: Left;   SHOULDER ARTHROSCOPY WITH SUBACROMIAL DECOMPRESSION, ROTATOR CUFF REPAIR AND BICEP TENDON REPAIR Left 12/03/2022   Procedure: SHOULDER Extensive ARTHROSCOPY WITH DEBRIDEMENT, removal of Retained Foreign Body;  Surgeon: Elner Hahn, MD;  Location: [facility] ORS;  Service: Orthopedics;  Laterality: Left;   SHOULDER ARTHROSCOPY WITH SUBACROMIAL DECOMPRESSION, ROTATOR CUFF REPAIR AND BICEP TENDON REPAIR Right 03/11/2023   Procedure: RIGHT SHOULDER ARTHROSCOPY WITH DEBRIDMENT, DECOMPRESSION and BICEPS TENODESIS.;  Surgeon: Elner Hahn, MD;  Location: [facility] ORS;  Service: Orthopedics;  Laterality: Right;   TRANSFORAMINAL LUMBAR INTERBODY FUSION  W/ MIS 1 LEVEL Right 05/23/2020   Procedure: Right Lumbar Five Sacral One Minimally invasive transforaminal lumbar interbody fusion;  Surgeon: Cannon Champion, MD;  Location: [facility];  Service: Neurosurgery;  Laterality: Right;  Right Lumbar Five Sacral One Minimally invasive transforaminal lumbar interbody fusion    Review of Systems:    All systems reviewed and negative except where noted in HPI.   Physical Examination:   LMP 12/16/2017 (Approximate)   General: Well-nourished, well-developed in no acute distress.  Lungs: Clear to auscultation bilaterally. Non-labored. Heart: Regular rate and rhythm, no murmurs rubs or gallops.  Abdomen: Bowel sounds are normal; Abdomen is Soft; No hepatosplenomegaly, masses or hernias;  No Abdominal Tenderness; No guarding or rebound tenderness. Neuro: Alert and oriented x 3.  Grossly intact.  Psych: Alert and cooperative, normal mood and affect.   Imaging Studies: No results found.  Assessment and Plan:   MILLISSA DEESE is a 58 y.o. y/o female returns for follow-up of:  1.  Iron  deficiency anemia  Labs: CBC, iron  panel, ferritin  Repeat colonoscopy  with 2-day prep.  Schedule capsule endoscopy.  2.  Chronic opioid-induced constipation  Continue Linzess  290  Try Movantik  Decrease opioids  3.  Mild gastritis  Continue Carafate   4.  Chronic abdominal pain    Brigitte Canard, [name]  Follow up ***  BP check ***

## 2023-07-09 ENCOUNTER — Ambulatory Visit: Payer: 59 | Admitting: Physician Assistant

## 2023-07-09 ENCOUNTER — Telehealth: Payer: 59 | Admitting: Family Medicine

## 2023-07-09 MED ORDER — CEFAZOLIN SODIUM-DEXTROSE 2-4 GM/100ML-% IV SOLN
2.0000 g | INTRAVENOUS | Status: AC
  Administered 2023-07-10 (×2): 2 g via INTRAVENOUS

## 2023-07-09 MED ORDER — CHLORHEXIDINE GLUCONATE 0.12 % MT SOLN
15.0000 mL | Freq: Once | OROMUCOSAL | Status: AC
Start: 2023-07-09 — End: 2023-07-10
  Administered 2023-07-10: 15 mL via OROMUCOSAL

## 2023-07-09 MED ORDER — LACTATED RINGERS IV SOLN: INTRAVENOUS | Status: DC

## 2023-07-09 MED ORDER — CEFAZOLIN IN SODIUM CHLORIDE 2-0.9 GM/100ML-% IV SOLN
2.0000 g | Freq: Once | INTRAVENOUS | Status: DC
  Filled 2023-07-09: qty 100

## 2023-07-09 MED ORDER — VANCOMYCIN HCL IN DEXTROSE 1-5 GM/200ML-% IV SOLN
1000.0000 mg | Freq: Once | INTRAVENOUS | Status: AC
  Administered 2023-07-10: 1000 mg via INTRAVENOUS

## 2023-07-09 MED ORDER — ORAL CARE MOUTH RINSE: 15.0000 mL | Freq: Once | OROMUCOSAL | Status: AC

## 2023-07-10 ENCOUNTER — Other Ambulatory Visit: Payer: Self-pay

## 2023-07-10 ENCOUNTER — Encounter
Admission: RE | Disposition: A | Discharge status: Home Health | Payer: Self-pay | Source: Home / Self Care | Attending: Neurosurgery

## 2023-07-10 ENCOUNTER — Inpatient Hospital Stay
Admission: RE | Admit: 2023-07-10 | Discharge: 2023-07-14 | DRG: 448 | Disposition: A | Discharge status: Home Health | Payer: 59 | Attending: Neurosurgery | Admitting: Neurosurgery

## 2023-07-10 ENCOUNTER — Encounter: Payer: Self-pay | Admitting: Neurosurgery

## 2023-07-10 ENCOUNTER — Inpatient Hospital Stay: Payer: 59 | Admitting: Certified Registered"

## 2023-07-10 ENCOUNTER — Inpatient Hospital Stay: Payer: 59

## 2023-07-10 DIAGNOSIS — Z79899 Other long term (current) drug therapy: Secondary | ICD-10-CM

## 2023-07-10 DIAGNOSIS — Z801 Family history of malignant neoplasm of trachea, bronchus and lung: Secondary | ICD-10-CM | POA: Diagnosis not present

## 2023-07-10 DIAGNOSIS — I700 Atherosclerosis of aorta: Secondary | ICD-10-CM | POA: Diagnosis present

## 2023-07-10 DIAGNOSIS — M5134 Other intervertebral disc degeneration, thoracic region: Secondary | ICD-10-CM | POA: Diagnosis present

## 2023-07-10 DIAGNOSIS — G894 Chronic pain syndrome: Secondary | ICD-10-CM | POA: Diagnosis present

## 2023-07-10 DIAGNOSIS — E7800 Pure hypercholesterolemia, unspecified: Secondary | ICD-10-CM | POA: Diagnosis present

## 2023-07-10 DIAGNOSIS — M5114 Intervertebral disc disorders with radiculopathy, thoracic region: Secondary | ICD-10-CM | POA: Diagnosis not present

## 2023-07-10 DIAGNOSIS — R053 Chronic cough: Secondary | ICD-10-CM | POA: Diagnosis present

## 2023-07-10 DIAGNOSIS — F4310 Post-traumatic stress disorder, unspecified: Secondary | ICD-10-CM | POA: Diagnosis present

## 2023-07-10 DIAGNOSIS — Z79891 Long term (current) use of opiate analgesic: Secondary | ICD-10-CM | POA: Diagnosis not present

## 2023-07-10 DIAGNOSIS — I739 Peripheral vascular disease, unspecified: Secondary | ICD-10-CM | POA: Diagnosis present

## 2023-07-10 DIAGNOSIS — Z8041 Family history of malignant neoplasm of ovary: Secondary | ICD-10-CM

## 2023-07-10 DIAGNOSIS — I252 Old myocardial infarction: Secondary | ICD-10-CM | POA: Diagnosis not present

## 2023-07-10 DIAGNOSIS — G992 Myelopathy in diseases classified elsewhere: Secondary | ICD-10-CM | POA: Diagnosis present

## 2023-07-10 DIAGNOSIS — I9581 Postprocedural hypotension: Secondary | ICD-10-CM | POA: Diagnosis not present

## 2023-07-10 DIAGNOSIS — Z8249 Family history of ischemic heart disease and other diseases of the circulatory system: Secondary | ICD-10-CM

## 2023-07-10 DIAGNOSIS — Z8619 Personal history of other infectious and parasitic diseases: Secondary | ICD-10-CM | POA: Diagnosis not present

## 2023-07-10 DIAGNOSIS — I10 Essential (primary) hypertension: Secondary | ICD-10-CM | POA: Diagnosis present

## 2023-07-10 DIAGNOSIS — J449 Chronic obstructive pulmonary disease, unspecified: Secondary | ICD-10-CM | POA: Diagnosis present

## 2023-07-10 DIAGNOSIS — M5104 Intervertebral disc disorders with myelopathy, thoracic region: Secondary | ICD-10-CM | POA: Diagnosis not present

## 2023-07-10 DIAGNOSIS — Z881 Allergy status to other antibiotic agents status: Secondary | ICD-10-CM

## 2023-07-10 DIAGNOSIS — M4804 Spinal stenosis, thoracic region: Secondary | ICD-10-CM | POA: Diagnosis present

## 2023-07-10 DIAGNOSIS — K760 Fatty (change of) liver, not elsewhere classified: Secondary | ICD-10-CM | POA: Diagnosis present

## 2023-07-10 DIAGNOSIS — Z7982 Long term (current) use of aspirin: Secondary | ICD-10-CM

## 2023-07-10 DIAGNOSIS — Z803 Family history of malignant neoplasm of breast: Secondary | ICD-10-CM | POA: Diagnosis not present

## 2023-07-10 DIAGNOSIS — I2510 Atherosclerotic heart disease of native coronary artery without angina pectoris: Secondary | ICD-10-CM | POA: Diagnosis present

## 2023-07-10 DIAGNOSIS — Z981 Arthrodesis status: Secondary | ICD-10-CM | POA: Diagnosis not present

## 2023-07-10 DIAGNOSIS — Z01818 Encounter for other preprocedural examination: Secondary | ICD-10-CM

## 2023-07-10 DIAGNOSIS — Z885 Allergy status to narcotic agent status: Secondary | ICD-10-CM

## 2023-07-10 DIAGNOSIS — Z8673 Personal history of transient ischemic attack (TIA), and cerebral infarction without residual deficits: Secondary | ICD-10-CM | POA: Diagnosis not present

## 2023-07-10 DIAGNOSIS — G8929 Other chronic pain: Secondary | ICD-10-CM | POA: Diagnosis present

## 2023-07-10 DIAGNOSIS — Z833 Family history of diabetes mellitus: Secondary | ICD-10-CM

## 2023-07-10 DIAGNOSIS — F17210 Nicotine dependence, cigarettes, uncomplicated: Secondary | ICD-10-CM | POA: Diagnosis present

## 2023-07-10 DIAGNOSIS — Z888 Allergy status to other drugs, medicaments and biological substances status: Secondary | ICD-10-CM

## 2023-07-10 DIAGNOSIS — M47816 Spondylosis without myelopathy or radiculopathy, lumbar region: Secondary | ICD-10-CM | POA: Diagnosis present

## 2023-07-10 DIAGNOSIS — J4489 Other specified chronic obstructive pulmonary disease: Secondary | ICD-10-CM | POA: Diagnosis present

## 2023-07-10 DIAGNOSIS — Z01812 Encounter for preprocedural laboratory examination: Principal | ICD-10-CM

## 2023-07-10 HISTORY — PX: APPLICATION OF INTRAOPERATIVE CT SCAN: SHX6668

## 2023-07-10 HISTORY — PX: THORACIC DISCECTOMY: SHX6113

## 2023-07-10 LAB — CBC
HCT: 32.3 % — ABNORMAL LOW (ref 36.0–46.0)
HCT: 29.6 % — ABNORMAL LOW (ref 36.0–46.0)
Hemoglobin: 10.2 g/dL — ABNORMAL LOW (ref 12.0–15.0)
Hemoglobin: 9.1 g/dL — ABNORMAL LOW (ref 12.0–15.0)
MCH: 23.2 pg — ABNORMAL LOW (ref 26.0–34.0)
MCH: 22.4 pg — ABNORMAL LOW (ref 26.0–34.0)
MCHC: 31.6 g/dL (ref 30.0–36.0)
MCHC: 30.7 g/dL (ref 30.0–36.0)
MCV: 73.6 fL — ABNORMAL LOW (ref 80.0–100.0)
MCV: 72.7 fL — ABNORMAL LOW (ref 80.0–100.0)
Platelets: 252 10*3/uL (ref 150–400)
Platelets: 211 10*3/uL (ref 150–400)
RBC: 4.39 MIL/uL (ref 3.87–5.11)
RBC: 4.07 MIL/uL (ref 3.87–5.11)
RDW: 16.9 % — ABNORMAL HIGH (ref 11.5–15.5)
RDW: 17.1 % — ABNORMAL HIGH (ref 11.5–15.5)
WBC: 6.6 10*3/uL (ref 4.0–10.5)
WBC: 8.6 10*3/uL (ref 4.0–10.5)
nRBC: 0 % (ref 0.0–0.2)
nRBC: 0 % (ref 0.0–0.2)

## 2023-07-10 LAB — BASIC METABOLIC PANEL
Anion gap: 10 (ref 5–15)
BUN: 11 mg/dL (ref 6–20)
CO2: 23 mmol/L (ref 22–32)
Calcium: 8.3 mg/dL — ABNORMAL LOW (ref 8.9–10.3)
Chloride: 102 mmol/L (ref 98–111)
Creatinine, Ser: 0.89 mg/dL (ref 0.44–1.00)
GFR, Estimated: >60 mL/min (ref >60)
Glucose, Bld: 112 mg/dL — ABNORMAL HIGH (ref 70–99)
Potassium: 4.1 mmol/L (ref 3.5–5.1)
Sodium: 135 mmol/L (ref 135–145)

## 2023-07-10 LAB — CREATININE, SERUM
Creatinine, Ser: 0.84 mg/dL (ref 0.44–1.00)
GFR, Estimated: >60 mL/min (ref >60)

## 2023-07-10 SURGERY — THORACIC DISCECTOMY
Anesthesia: General

## 2023-07-10 MED ORDER — HYDROMORPHONE HCL 1 MG/ML IJ SOLN
1.0000 mg | INTRAMUSCULAR | Status: DC | PRN
  Administered 2023-07-10: 1 mg via INTRAVENOUS
  Filled 2023-07-10: qty 1

## 2023-07-10 MED ORDER — BUPIVACAINE-EPINEPHRINE (PF) 0.5% -1:200000 IJ SOLN
INTRAMUSCULAR | Status: DC | PRN
  Administered 2023-07-10: 10 mL

## 2023-07-10 MED ORDER — SURGIFLO WITH THROMBIN (HEMOSTATIC MATRIX KIT) OPTIME
TOPICAL | Status: DC | PRN
  Administered 2023-07-10 (×2): 1 via TOPICAL

## 2023-07-10 MED ORDER — CEFAZOLIN SODIUM-DEXTROSE 2-4 GM/100ML-% IV SOLN
INTRAVENOUS | Status: AC
  Filled 2023-07-10: qty 100

## 2023-07-10 MED ORDER — REMIFENTANIL HCL 1 MG IV SOLR
INTRAVENOUS | Status: DC | PRN
  Administered 2023-07-10: .2 ug/kg/min via INTRAVENOUS

## 2023-07-10 MED ORDER — SODIUM CHLORIDE (PF) 0.9 % IJ SOLN
INTRAMUSCULAR | Status: DC | PRN
  Administered 2023-07-10: 60 mL

## 2023-07-10 MED ORDER — DOCUSATE SODIUM 100 MG PO CAPS
100.0000 mg | ORAL_CAPSULE | Freq: Two times a day (BID) | ORAL | Status: DC
  Administered 2023-07-10 – 2023-07-14 (×9): 100 mg via ORAL
  Filled 2023-07-10 (×9): qty 1

## 2023-07-10 MED ORDER — AMLODIPINE BESYLATE 5 MG PO TABS
5.0000 mg | ORAL_TABLET | Freq: Every day | ORAL | Status: DC
  Administered 2023-07-10 – 2023-07-14 (×5): 5 mg via ORAL
  Filled 2023-07-10 (×5): qty 1

## 2023-07-10 MED ORDER — METHOCARBAMOL 500 MG PO TABS
500.0000 mg | ORAL_TABLET | Freq: Four times a day (QID) | ORAL | Status: DC
  Administered 2023-07-10 (×2): 500 mg via ORAL
  Filled 2023-07-10 (×2): qty 1

## 2023-07-10 MED ORDER — FENTANYL CITRATE (PF) 100 MCG/2ML IJ SOLN
INTRAMUSCULAR | Status: AC
  Filled 2023-07-10: qty 2

## 2023-07-10 MED ORDER — ATORVASTATIN CALCIUM 20 MG PO TABS
40.0000 mg | ORAL_TABLET | Freq: Every evening | ORAL | Status: DC
  Administered 2023-07-10 – 2023-07-13 (×4): 40 mg via ORAL
  Filled 2023-07-10 (×4): qty 2

## 2023-07-10 MED ORDER — OXYCODONE HCL 5 MG PO TABS: 10.0000 mg | ORAL_TABLET | ORAL | Status: DC | PRN

## 2023-07-10 MED ORDER — HYDROMORPHONE HCL 1 MG/ML IJ SOLN
INTRAMUSCULAR | Status: AC
  Filled 2023-07-10: qty 1

## 2023-07-10 MED ORDER — MENTHOL 3 MG MT LOZG: 1.0000 | LOZENGE | OROMUCOSAL | Status: DC | PRN

## 2023-07-10 MED ORDER — ALBUMIN HUMAN 5 % IV SOLN: INTRAVENOUS | Status: DC | PRN

## 2023-07-10 MED ORDER — BUPIVACAINE LIPOSOME 1.3 % IJ SUSP
INTRAMUSCULAR | Status: AC
  Filled 2023-07-10: qty 20

## 2023-07-10 MED ORDER — LISINOPRIL 20 MG PO TABS
40.0000 mg | ORAL_TABLET | Freq: Every evening | ORAL | Status: DC
  Administered 2023-07-10 – 2023-07-13 (×4): 40 mg via ORAL
  Filled 2023-07-10 (×4): qty 2

## 2023-07-10 MED ORDER — BUPIVACAINE HCL (PF) 0.5 % IJ SOLN
INTRAMUSCULAR | Status: AC
  Filled 2023-07-10: qty 30

## 2023-07-10 MED ORDER — METHOCARBAMOL 500 MG PO TABS
750.0000 mg | ORAL_TABLET | Freq: Four times a day (QID) | ORAL | Status: DC
  Administered 2023-07-11 – 2023-07-14 (×14): 750 mg via ORAL
  Filled 2023-07-10 (×14): qty 2

## 2023-07-10 MED ORDER — METHOCARBAMOL 1000 MG/10ML IJ SOLN: 500.0000 mg | Freq: Four times a day (QID) | INTRAMUSCULAR | Status: DC | PRN

## 2023-07-10 MED ORDER — OXYCODONE HCL 5 MG/5ML PO SOLN: 5.0000 mg | Freq: Once | ORAL | Status: DC | PRN

## 2023-07-10 MED ORDER — DEXAMETHASONE SODIUM PHOSPHATE 10 MG/ML IJ SOLN
INTRAMUSCULAR | Status: DC | PRN
  Administered 2023-07-10: 10 mg via INTRAVENOUS

## 2023-07-10 MED ORDER — ALBUTEROL SULFATE (2.5 MG/3ML) 0.083% IN NEBU: 3.0000 mL | INHALATION_SOLUTION | Freq: Four times a day (QID) | RESPIRATORY_TRACT | Status: DC | PRN

## 2023-07-10 MED ORDER — ORAL CARE MOUTH RINSE: 15.0000 mL | OROMUCOSAL | Status: DC | PRN

## 2023-07-10 MED ORDER — SODIUM CHLORIDE 0.9% FLUSH
3.0000 mL | INTRAVENOUS | Status: DC | PRN
  Administered 2023-07-12: 3 mL via INTRAVENOUS

## 2023-07-10 MED ORDER — ONDANSETRON HCL 4 MG PO TABS: 4.0000 mg | ORAL_TABLET | Freq: Four times a day (QID) | ORAL | Status: DC | PRN

## 2023-07-10 MED ORDER — PROPOFOL 1000 MG/100ML IV EMUL
INTRAVENOUS | Status: AC
  Filled 2023-07-10: qty 100

## 2023-07-10 MED ORDER — ALBUMIN HUMAN 5 % IV SOLN
INTRAVENOUS | Status: AC
  Filled 2023-07-10: qty 500

## 2023-07-10 MED ORDER — PHENYLEPHRINE 80 MCG/ML (10ML) SYRINGE FOR IV PUSH (FOR BLOOD PRESSURE SUPPORT)
PREFILLED_SYRINGE | INTRAVENOUS | Status: DC | PRN
  Administered 2023-07-10 (×2): 160 ug via INTRAVENOUS
  Administered 2023-07-10 (×3): 80 ug via INTRAVENOUS
  Administered 2023-07-10: 160 ug via INTRAVENOUS

## 2023-07-10 MED ORDER — FENTANYL CITRATE (PF) 100 MCG/2ML IJ SOLN
INTRAMUSCULAR | Status: DC | PRN
  Administered 2023-07-10 (×2): 50 ug via INTRAVENOUS

## 2023-07-10 MED ORDER — SODIUM CHLORIDE FLUSH 0.9 % IV SOLN
INTRAVENOUS | Status: AC
  Filled 2023-07-10: qty 20

## 2023-07-10 MED ORDER — REMIFENTANIL HCL 1 MG IV SOLR
INTRAVENOUS | Status: AC
  Filled 2023-07-10: qty 1000

## 2023-07-10 MED ORDER — GLYCOPYRROLATE 0.2 MG/ML IJ SOLN
INTRAMUSCULAR | Status: DC | PRN
  Administered 2023-07-10: .2 mg via INTRAVENOUS

## 2023-07-10 MED ORDER — LIDOCAINE HCL (CARDIAC) PF 100 MG/5ML IV SOSY
PREFILLED_SYRINGE | INTRAVENOUS | Status: DC | PRN
  Administered 2023-07-10: 100 mg via INTRAVENOUS

## 2023-07-10 MED ORDER — OXYCODONE HCL 5 MG PO TABS: 5.0000 mg | ORAL_TABLET | Freq: Once | ORAL | Status: DC | PRN

## 2023-07-10 MED ORDER — PHENYLEPHRINE HCL-NACL 20-0.9 MG/250ML-% IV SOLN
INTRAVENOUS | Status: DC | PRN
  Administered 2023-07-10: 25 ug/min via INTRAVENOUS

## 2023-07-10 MED ORDER — BISACODYL 5 MG PO TBEC
5.0000 mg | DELAYED_RELEASE_TABLET | Freq: Every day | ORAL | Status: DC | PRN
  Administered 2023-07-12: 5 mg via ORAL
  Filled 2023-07-10: qty 1

## 2023-07-10 MED ORDER — UMECLIDINIUM BROMIDE 62.5 MCG/ACT IN AEPB
1.0000 | INHALATION_SPRAY | Freq: Every day | RESPIRATORY_TRACT | Status: DC
  Administered 2023-07-11 – 2023-07-14 (×4): 1 via RESPIRATORY_TRACT
  Filled 2023-07-10: qty 7

## 2023-07-10 MED ORDER — PANTOPRAZOLE SODIUM 40 MG PO TBEC: 40.0000 mg | DELAYED_RELEASE_TABLET | Freq: Every day | ORAL | Status: DC

## 2023-07-10 MED ORDER — OXYCODONE HCL 5 MG PO TABS
ORAL_TABLET | ORAL | Status: AC
  Filled 2023-07-10: qty 3

## 2023-07-10 MED ORDER — ACETAMINOPHEN 10 MG/ML IV SOLN
INTRAVENOUS | Status: AC
  Filled 2023-07-10: qty 100

## 2023-07-10 MED ORDER — GABAPENTIN 300 MG PO CAPS
600.0000 mg | ORAL_CAPSULE | Freq: Four times a day (QID) | ORAL | Status: DC
  Administered 2023-07-10 – 2023-07-14 (×15): 600 mg via ORAL
  Filled 2023-07-10 (×15): qty 2

## 2023-07-10 MED ORDER — CHLORHEXIDINE GLUCONATE 0.12 % MT SOLN
OROMUCOSAL | Status: AC
  Filled 2023-07-10: qty 15

## 2023-07-10 MED ORDER — SODIUM CHLORIDE 0.9 % IV SOLN: INTRAVENOUS | Status: AC

## 2023-07-10 MED ORDER — NALOXONE HCL 4 MG/0.1ML NA LIQD: 1.0000 | NASAL | Status: DC | PRN

## 2023-07-10 MED ORDER — BUPIVACAINE-EPINEPHRINE (PF) 0.5% -1:200000 IJ SOLN
INTRAMUSCULAR | Status: AC
  Filled 2023-07-10: qty 20

## 2023-07-10 MED ORDER — SODIUM CHLORIDE 0.9% FLUSH
3.0000 mL | Freq: Two times a day (BID) | INTRAVENOUS | Status: DC
  Administered 2023-07-10 – 2023-07-13 (×6): 3 mL via INTRAVENOUS

## 2023-07-10 MED ORDER — HYDROMORPHONE HCL 1 MG/ML IJ SOLN
1.0000 mg | INTRAMUSCULAR | Status: DC | PRN
  Administered 2023-07-10 – 2023-07-14 (×15): 1 mg via INTRAVENOUS
  Filled 2023-07-10 (×15): qty 1

## 2023-07-10 MED ORDER — TIOTROPIUM BROMIDE MONOHYDRATE 2.5 MCG/ACT IN AERS
1.0000 | INHALATION_SPRAY | Freq: Every morning | RESPIRATORY_TRACT | Status: DC
Start: 2023-07-11 — End: 2023-07-10

## 2023-07-10 MED ORDER — ALBUTEROL SULFATE HFA 108 (90 BASE) MCG/ACT IN AERS
INHALATION_SPRAY | RESPIRATORY_TRACT | Status: DC | PRN
  Administered 2023-07-10 (×2): 4 via RESPIRATORY_TRACT

## 2023-07-10 MED ORDER — PROPOFOL 500 MG/50ML IV EMUL
INTRAVENOUS | Status: DC | PRN
  Administered 2023-07-10: 165 ug/kg/min via INTRAVENOUS
  Administered 2023-07-10: 155 ug/kg/min via INTRAVENOUS

## 2023-07-10 MED ORDER — PROPOFOL 10 MG/ML IV BOLUS
INTRAVENOUS | Status: AC
  Filled 2023-07-10: qty 20

## 2023-07-10 MED ORDER — IRRISEPT - 450ML BOTTLE WITH 0.05% CHG IN STERILE WATER, USP 99.95% OPTIME
TOPICAL | Status: DC | PRN
  Administered 2023-07-10: 450 mL

## 2023-07-10 MED ORDER — PHENOL 1.4 % MT LIQD: 1.0000 | OROMUCOSAL | Status: DC | PRN

## 2023-07-10 MED ORDER — HYDROMORPHONE HCL 2 MG PO TABS
2.0000 mg | ORAL_TABLET | ORAL | Status: DC | PRN
  Administered 2023-07-11 – 2023-07-13 (×5): 2 mg via ORAL
  Filled 2023-07-10 (×6): qty 1

## 2023-07-10 MED ORDER — SODIUM CHLORIDE 0.9 % IV SOLN: 250.0000 mL | INTRAVENOUS | Status: AC

## 2023-07-10 MED ORDER — MIDAZOLAM HCL 2 MG/2ML IJ SOLN
INTRAMUSCULAR | Status: DC | PRN
  Administered 2023-07-10: 2 mg via INTRAVENOUS

## 2023-07-10 MED ORDER — HYDROMORPHONE HCL 2 MG PO TABS
4.0000 mg | ORAL_TABLET | ORAL | Status: DC | PRN
  Administered 2023-07-11 – 2023-07-14 (×14): 4 mg via ORAL
  Filled 2023-07-10 (×17): qty 2

## 2023-07-10 MED ORDER — KETAMINE HCL 50 MG/5ML IJ SOSY
PREFILLED_SYRINGE | INTRAMUSCULAR | Status: AC
  Filled 2023-07-10: qty 5

## 2023-07-10 MED ORDER — ALUM & MAG HYDROXIDE-SIMETH 200-200-20 MG/5ML PO SUSP
30.0000 mL | Freq: Four times a day (QID) | ORAL | Status: DC | PRN
Start: 2023-07-10 — End: 2023-07-14

## 2023-07-10 MED ORDER — OXYCODONE HCL 5 MG PO TABS
15.0000 mg | ORAL_TABLET | ORAL | Status: DC | PRN
  Administered 2023-07-10: 15 mg via ORAL
  Filled 2023-07-10: qty 3

## 2023-07-10 MED ORDER — PROPOFOL 10 MG/ML IV BOLUS
INTRAVENOUS | Status: DC | PRN
  Administered 2023-07-10: 150 mg via INTRAVENOUS

## 2023-07-10 MED ORDER — 0.9 % SODIUM CHLORIDE (POUR BTL) OPTIME
TOPICAL | Status: DC | PRN
  Administered 2023-07-10: 1000 mL

## 2023-07-10 MED ORDER — HYDROMORPHONE HCL 1 MG/ML IJ SOLN
0.5000 mg | Freq: Once | INTRAMUSCULAR | Status: AC
Start: 2023-07-10 — End: 2023-07-10
  Administered 2023-07-10: 0.5 mg via INTRAVENOUS

## 2023-07-10 MED ORDER — NITROGLYCERIN 0.4 MG SL SUBL: 0.4000 mg | SUBLINGUAL_TABLET | SUBLINGUAL | Status: DC | PRN

## 2023-07-10 MED ORDER — OXYCODONE HCL 5 MG PO TABS: 5.0000 mg | ORAL_TABLET | ORAL | Status: DC | PRN

## 2023-07-10 MED ORDER — METOPROLOL TARTRATE 25 MG PO TABS
25.0000 mg | ORAL_TABLET | Freq: Two times a day (BID) | ORAL | Status: DC
  Administered 2023-07-10 – 2023-07-14 (×8): 25 mg via ORAL
  Filled 2023-07-10 (×8): qty 1

## 2023-07-10 MED ORDER — VANCOMYCIN HCL IN DEXTROSE 1-5 GM/200ML-% IV SOLN
INTRAVENOUS | Status: AC
  Filled 2023-07-10: qty 200

## 2023-07-10 MED ORDER — SENNA 8.6 MG PO TABS
1.0000 | ORAL_TABLET | Freq: Two times a day (BID) | ORAL | Status: DC
  Administered 2023-07-10 – 2023-07-14 (×9): 8.6 mg via ORAL
  Filled 2023-07-10 (×9): qty 1

## 2023-07-10 MED ORDER — SUCCINYLCHOLINE CHLORIDE 200 MG/10ML IV SOSY
PREFILLED_SYRINGE | INTRAVENOUS | Status: DC | PRN
  Administered 2023-07-10: 100 mg via INTRAVENOUS

## 2023-07-10 MED ORDER — MIDAZOLAM HCL 2 MG/2ML IJ SOLN
INTRAMUSCULAR | Status: AC
Start: 2023-07-10 — End: ?
  Filled 2023-07-10: qty 2

## 2023-07-10 MED ORDER — ONDANSETRON HCL 4 MG/2ML IJ SOLN
INTRAMUSCULAR | Status: DC | PRN
  Administered 2023-07-10 (×2): 4 mg via INTRAVENOUS

## 2023-07-10 MED ORDER — VANCOMYCIN HCL 1000 MG IV SOLR
INTRAVENOUS | Status: DC | PRN
  Administered 2023-07-10: 1000 mg via TOPICAL

## 2023-07-10 MED ORDER — HYDROMORPHONE HCL 1 MG/ML IJ SOLN
INTRAMUSCULAR | Status: DC | PRN
  Administered 2023-07-10: 1 mg via INTRAVENOUS
  Administered 2023-07-10: .5 mg via INTRAVENOUS

## 2023-07-10 MED ORDER — VANCOMYCIN HCL IN DEXTROSE 1-5 GM/200ML-% IV SOLN
1000.0000 mg | Freq: Once | INTRAVENOUS | Status: AC
  Administered 2023-07-10: 1000 mg via INTRAVENOUS
  Filled 2023-07-10: qty 200

## 2023-07-10 MED ORDER — SODIUM CHLORIDE 0.9 % IV SOLN: INTRAVENOUS | Status: DC | PRN

## 2023-07-10 MED ORDER — ACETAMINOPHEN 10 MG/ML IV SOLN
INTRAVENOUS | Status: DC | PRN
  Administered 2023-07-10: 1000 mg via INTRAVENOUS

## 2023-07-10 MED ORDER — MAGNESIUM CITRATE PO SOLN: 1.0000 | Freq: Once | ORAL | Status: DC | PRN

## 2023-07-10 MED ORDER — ENOXAPARIN SODIUM 40 MG/0.4ML IJ SOSY
40.0000 mg | PREFILLED_SYRINGE | INTRAMUSCULAR | Status: DC
  Administered 2023-07-11 – 2023-07-14 (×4): 40 mg via SUBCUTANEOUS
  Filled 2023-07-10 (×4): qty 0.4

## 2023-07-10 MED ORDER — LINACLOTIDE 290 MCG PO CAPS
290.0000 ug | ORAL_CAPSULE | Freq: Every day | ORAL | Status: DC | PRN
Start: 2023-07-10 — End: 2023-07-14

## 2023-07-10 MED ORDER — FENTANYL CITRATE (PF) 100 MCG/2ML IJ SOLN
25.0000 ug | INTRAMUSCULAR | Status: DC | PRN
  Administered 2023-07-10 (×3): 50 ug via INTRAVENOUS

## 2023-07-10 MED ORDER — ACETAMINOPHEN 500 MG PO TABS
1000.0000 mg | ORAL_TABLET | Freq: Four times a day (QID) | ORAL | Status: DC
  Administered 2023-07-10 – 2023-07-14 (×14): 1000 mg via ORAL
  Filled 2023-07-10 (×15): qty 2

## 2023-07-10 MED ORDER — METHOCARBAMOL 500 MG PO TABS: 500.0000 mg | ORAL_TABLET | Freq: Four times a day (QID) | ORAL | Status: DC | PRN

## 2023-07-10 MED ORDER — ONDANSETRON HCL 4 MG/2ML IJ SOLN: 4.0000 mg | Freq: Four times a day (QID) | INTRAMUSCULAR | Status: DC | PRN

## 2023-07-10 MED ORDER — SUCRALFATE 1 GM/10ML PO SUSP
1.0000 g | Freq: Four times a day (QID) | ORAL | Status: DC
Start: 2023-07-10 — End: 2023-07-14
  Administered 2023-07-10 – 2023-07-14 (×15): 1 g via ORAL
  Filled 2023-07-10 (×15): qty 10

## 2023-07-10 MED ORDER — KETAMINE HCL 10 MG/ML IJ SOLN
INTRAMUSCULAR | Status: DC | PRN
  Administered 2023-07-10: 10 mg via INTRAVENOUS
  Administered 2023-07-10: 20 mg via INTRAVENOUS

## 2023-07-10 MED ORDER — EPHEDRINE SULFATE-NACL 50-0.9 MG/10ML-% IV SOSY
PREFILLED_SYRINGE | INTRAVENOUS | Status: DC | PRN
  Administered 2023-07-10: 5 mg via INTRAVENOUS

## 2023-07-10 MED ORDER — PANTOPRAZOLE SODIUM 40 MG IV SOLR
40.0000 mg | Freq: Every day | INTRAVENOUS | Status: DC
  Administered 2023-07-10 – 2023-07-13 (×4): 40 mg via INTRAVENOUS
  Filled 2023-07-10 (×4): qty 10

## 2023-07-10 SURGICAL SUPPLY — 64 items
ALLOGRAFT BONESTRIP KORE 2.5X5 (Bone Implant) IMPLANT
BASIN KIT SINGLE STR (MISCELLANEOUS) ×1 IMPLANT
BRUSH SCRUB EZ 4% CHG (MISCELLANEOUS) ×1 IMPLANT
BUR NEURO DRILL SOFT 3.0X3.8M (BURR) ×1 IMPLANT
CHLORAPREP W/TINT 26 (MISCELLANEOUS) ×1 IMPLANT
DERMABOND ADVANCED .7 DNX12 (GAUZE/BANDAGES/DRESSINGS) ×1 IMPLANT
DRAPE C ARM PK CFD 31 SPINE (DRAPES) ×1 IMPLANT
DRAPE C-ARM XRAY 36X54 (DRAPES) IMPLANT
DRAPE LAPAROTOMY 100X77 ABD (DRAPES) ×1 IMPLANT
DRAPE MICROSCOPE SPINE 48X150 (DRAPES) IMPLANT
DRAPE SCAN PATIENT (DRAPES) ×1 IMPLANT
DRESSING PEEL AND PLAC PRVNA20 (GAUZE/BANDAGES/DRESSINGS) IMPLANT
DRSG OPSITE POSTOP 4X8 (GAUZE/BANDAGES/DRESSINGS) IMPLANT
DRSG PEEL AND PLACE PREVENA 20 (GAUZE/BANDAGES/DRESSINGS) ×1 IMPLANT
DRSG TEGADERM 4X10 (GAUZE/BANDAGES/DRESSINGS) IMPLANT
DRSG TEGADERM 4X4.75 (GAUZE/BANDAGES/DRESSINGS) IMPLANT
ELECT EZSTD 165MM 6.5IN (MISCELLANEOUS) ×1 IMPLANT
ELECT REM PT RETURN 9FT ADLT (ELECTROSURGICAL) ×1 IMPLANT
ELECTRODE EZSTD 165MM 6.5IN (MISCELLANEOUS) IMPLANT
ELECTRODE REM PT RTRN 9FT ADLT (ELECTROSURGICAL) ×1 IMPLANT
EVACUATOR 1/8 PVC DRAIN (DRAIN) IMPLANT
EX-PIN ORTHOLOCK NAV 4X150 (PIN) IMPLANT
FEE INTRAOP CADWELL SUPPLY NCS (MISCELLANEOUS) IMPLANT
FEE INTRAOP MONITOR IMPULS NCS (MISCELLANEOUS) IMPLANT
GAUZE 4X4 16PLY ~~LOC~~+RFID DBL (SPONGE) ×1 IMPLANT
GLOVE BIOGEL PI IND STRL 7.0 (GLOVE) ×2 IMPLANT
GLOVE BIOGEL PI IND STRL 8 (GLOVE) ×2 IMPLANT
GLOVE SRG 8 PF TXTR STRL LF DI (GLOVE) ×1 IMPLANT
GLOVE SURG SYN 7.0 (GLOVE) ×2 IMPLANT
GLOVE SURG SYN 7.0 PF PI (GLOVE) ×2 IMPLANT
GLOVE SURG SYN 7.5 E (GLOVE) ×2 IMPLANT
GLOVE SURG SYN 7.5 PF PI (GLOVE) ×2 IMPLANT
GOWN SRG LRG LVL 4 IMPRV REINF (GOWNS) ×3 IMPLANT
GOWN SRG XL LVL 3 NONREINFORCE (GOWNS) ×1 IMPLANT
HOLDER FOLEY CATH W/STRAP (MISCELLANEOUS) IMPLANT
INTRAOP CADWELL SUPPLY FEE NCS (MISCELLANEOUS) ×1 IMPLANT
INTRAOP MONITOR FEE IMPULS NCS (MISCELLANEOUS) ×1 IMPLANT
JET LAVAGE IRRISEPT WOUND (IRRIGATION / IRRIGATOR) ×1 IMPLANT
KIT PREVENA INCISION MGT 13 (CANNISTER) IMPLANT
KIT SPINAL PRONEVIEW (KITS) ×1 IMPLANT
KNIFE BAYONET SHORT DISCETOMY (MISCELLANEOUS) IMPLANT
LAVAGE JET IRRISEPT WOUND (IRRIGATION / IRRIGATOR) ×1 IMPLANT
MANIFOLD NEPTUNE II (INSTRUMENTS) ×1 IMPLANT
MARKER SKIN DUAL TIP RULER LAB (MISCELLANEOUS) ×1 IMPLANT
MARKER SPHERE PSV REFLC 13MM (MARKER) ×7 IMPLANT
NDL SAFETY ECLIPSE 18X1.5 (NEEDLE) ×1 IMPLANT
NS IRRIG 500ML POUR BTL (IV SOLUTION) ×1 IMPLANT
PACK LAMINECTOMY ARMC (PACKS) ×1 IMPLANT
PAD ARMBOARD 7.5X6 YLW CONV (MISCELLANEOUS) ×1 IMPLANT
ROD RELINE 5.5X90MM LORDOTIC (Rod) IMPLANT
SCREW LOCK RELINE 5.5 TULIP (Screw) IMPLANT
SCREW RELINE-O POLY 5.5X40 (Screw) IMPLANT
SCREW RELINE-O POLY 6.5X40 (Screw) IMPLANT
STAPLER SKIN PROX 35W (STAPLE) IMPLANT
SURGIFLO W/THROMBIN 8M KIT (HEMOSTASIS) ×1 IMPLANT
SUT ETHILON 3-0 FS-10 30 BLK (SUTURE) ×1 IMPLANT
SUT STRATA 3-0 15 PS-2 (SUTURE) ×1 IMPLANT
SUT VIC AB 0 CT1 27XCR 8 STRN (SUTURE) ×2 IMPLANT
SUT VIC AB 2-0 CT1 18 (SUTURE) ×2 IMPLANT
SUTURE EHLN 3-0 FS-10 30 BLK (SUTURE) IMPLANT
SYR 30ML LL (SYRINGE) ×2 IMPLANT
TOWEL OR 17X26 4PK STRL BLUE (TOWEL DISPOSABLE) ×1 IMPLANT
TRAP FLUID SMOKE EVACUATOR (MISCELLANEOUS) ×1 IMPLANT
TRAY FOLEY SLVR 16FR LF STAT (SET/KITS/TRAYS/PACK) IMPLANT

## 2023-07-10 NOTE — Op Note (Signed)
Indications: 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  She has failed conservative therapy.  She continued to have worsening myeloradiculopathy and the predominantly right lower extremity radiating from mid back.  Given ongoing compression and lack of improvement with conservative management as well as progressive myelopathy we took her to the OR for decompression via a transpedicular discectomy at T8-9 and T9-10 with a arthrodesis from T8-T11.  Findings: Large disc herniation noted at T8-9 causing significant compression and indentation of the spinal cord, T9-10 was smaller but still causing significant compression.  Post decompression ultrasound verified decompression of the thoracic spinal cord at each level.  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   Preoperative Note:   Risks of surgery discussed include: infection, bleeding, stroke, coma, death, paralysis, CSF leak, nerve/spinal cord injury, numbness, tingling, weakness, complex regional pain syndrome, recurrent stenosis and/or disc herniation, vascular injury, development of instability, neck/back pain, need for further surgery, persistent symptoms, development of deformity, and the risks of anesthesia. The patient understood these risks and agreed to proceed.  Operative Note:  1.  Laminectomy and transpedicular decompression at T8-9 2.  Laminectomy and transpedicular decompression at T9-10 3.  Posterior segmental instrumentation from T8-T11 4.  Arthrodesis posterior lateral T8-T11 5.  Autograft harvest and placement, same incision, posterior elements. 6.  Allograft placement 7.  Stereotaxy for spinal navigation    The  patient was brought to the Operating Room, intubated and turned into the prone position. All pressure points were checked and double checked. Flouroscopy was used to mark the incision. The patient was prepped and draped in the standard fashion. A full timeout was performed.  Intraoperative neuromonitoring was placed.  preoperative antibiotics were given. The incision was injected with local anesthetic.  The incision was opened with a scalpel, then the soft tissues divided with the Bovie. Self-retaining retractors were placed. The paraspinus muscles were reflected laterally in subperiosteal fashion until the transverse processes were visible. Flouroscopy was used to confirm our localization.  The stereotactic array was placed.  Stereotactic images were acquired and registered to the patient.  We then used stereotactically guided drill guides to cannulate the pedicles bilaterally from T8-T11   The pedicle screws were placed at all levels on the left from T8-T11, and at the T8 and T11 level on the right initially.  The pedicle was cannulated at T9 and T10 however the screw was left out for decompression.  Specific screws were listed below.  After placement of the screws we then harvested the spinous processes and lamina for autograft.  We then remove the bone of the posterior elements from T8-T10.  We utilized a high-speed drill to perform laminectomies at these levels.  We then utilized curettes and Kerrisons to remove the intervening ligamentum flavum.  Once we had a good central posterior decompression we then turned our attention to the transpedicular discectomies.  We isolated the pedicle of T9 on the right.  We resected greater than 50% of the pedicle with a majority of the medial wall in order to gain access without necessitating retraction of the spinal cord.  We are able to clearly identify the disc space at this level.  Utilizing ultrasound we saw a large disc herniation ventral to the spinal cord which  was causing severe compression.  Coming in from the lateral aspect with care taken to preserve the exiting nerve root above we were able to dissected the disc herniation off of the ventral spinal cord we did not have a clear space to resect this so we utilized a high-speed drill to create space in the intervertebral/disc space below.  Once we had enough room we are able to tamp the disc herniation down to where we could resect it without manipulating the spinal cord.  We are able to resect the herniated portion of the disc.  We then continued to clean out the disc herniation until there was no more ongoing compression verified with the ultrasound.  We continue to rinse out the space for any residual loose tissue.  Once we had a good decompression at this level we then turned our attention to the same decompression at the level below at T9-10.  We resected over 50% of the T10 pedicle on the right with complete removal of the medial wall down to the level of the body.  We are able to clearly access the disc space at this level.  This disc was not as large however was still causing a ventral deformity of the spinal cord and significant compression.  We performed an annulotomy and tissue under high-pressure came out.  We removed the herniated disc fragments.  We then reevaluated under ultrasound guidance and were able to identify no further compression.  The spinal cord was well decompressed across each disc space.  We then obtained hemostasis.  At this point we utilized a high-speed drill to prepare the posterior lateral elements for arthrodesis from T8-T11.  We paid special attention to the joint lines at each intervening level, remove the connective tissue from between these and the synovium and utilized a high-speed drill to decorticate.  We then placed the T9 and T10 pedicle screws into the bodies of T9 and T10 and were able to clearly identify them not causing any ongoing compression of the spinal cord.  We  then took precut kyphotic rods and placed them in the tulip heads.  We secured these with set screws.  We then used the torque device to secure them in place.  After everything was in place we then took a follow-up three-dimensional scan to see whether or not there were any breaches, the instrumentation was in good location.  Once all of the instrumentation was in place we used Irrisept irrigation, we then performed a another round of irrigation.  We placed allograft and autograft that were mixed together with antibiotics at each level of arthrodesis between T8 and T9 bilaterally.  We placed a subfascial drain and closed in multiple layers.  After hemostasis, the wound was closed in layers with 0 and 2-0 vicryl.  3 oh STRATAFIX was used on the most superficial layer and a Prevena wound VAC was placed.  Final x-rays were obtained, final motor and sensory testing was obtained and was stable throughout the entire procedure.  The patient was then flipped supine and extubated with incident. All counts were correct times 2 at the end of the case. No immediate complications were noted.  Nehemiah Settle Frisbie [name] assisted in the entire procedure. An assistant was required for this procedure due to the complexity.  The assistant provided assistance in tissue manipulation and suction, and was required for the successful and safe performance of the procedure. I performed the critical portions of the procedure.   Lovenia Kim, MD  Implant  Name Type Inv. Item Serial No. Manufacturer Lot No. LRB No. Used Action  ALLOGRAFT BONESTRIP KORE 2.5X5 - [id number] Bone Implant ALLOGRAFT BONESTRIP KORE 2.5X5 [id number] [employer] FNDN  N/A 1 Implanted  ALLOGRAFT BONESTRIP KORE 2.5X5 - [id number] Bone Implant ALLOGRAFT BONESTRIP KORE 2.5X5 [id number] MUSCULOSKELETL TRANSPLANT FNDN  N/A 1 Implanted  SCREW RELINE-O POLY 6.5X40 - [id number] Screw SCREW RELINE-O POLY 6.5X40   [employer]  N/A 4 Implanted  ROD RELINE 5.5X90MM LORDOTIC - [id number] Rod ROD RELINE 5.5X90MM LORDOTIC  [employer]  N/A 2 Implanted  SCREW LOCK RELINE 5.5 TULIP - [id number] Screw SCREW LOCK RELINE 5.5 TULIP  [employer]  N/A 8 Implanted  SCREW RELINE-O POLY 5.5X40 - [id number] Screw SCREW RELINE-O POLY 5.5X40  [employer]  N/A 4 Implanted

## 2023-07-10 NOTE — Anesthesia Procedure Notes (Signed)
Procedure Name: Intubation Date/Time: 07/10/2023 7:26 AM  Performed by: Mohammed Kindle, CRNAPre-anesthesia Checklist: Patient identified, Emergency Drugs available, Suction available and Patient being monitored Patient Re-evaluated:Patient Re-evaluated prior to induction Oxygen Delivery Method: Circle system utilized Preoxygenation: Pre-oxygenation with 100% oxygen Induction Type: IV induction Ventilation: Mask ventilation without difficulty Laryngoscope Size: McGrath and 3 Grade View: Grade I Tube type: Oral Tube size: 6.5 mm Number of attempts: 1 Airway Equipment and Method: Stylet and Bite block Placement Confirmation: ETT inserted through vocal cords under direct vision, positive ETCO2 and breath sounds checked- equal and bilateral Secured at: 21 cm Tube secured with: Tape Dental Injury: Teeth and Oropharynx as per pre-operative assessment

## 2023-07-10 NOTE — Interval H&P Note (Signed)
History and Physical Interval Note:  07/10/2023 6:52 AM  Sara Munoz  has presented today for surgery, with the diagnosis of M48.04 Myelopathy concurrent with and due to spinal stenosis of thoracic region M51.34 Other intervertebral disc degeneration, thoracic region.  The various methods of treatment have been discussed with the patient and family. After consideration of risks, benefits and other options for treatment, the patient has consented to  Procedure(s): T8-9 & T9-10 TRANSPEDICULAR DECOMPRESSION/DISCECTOMY (N/A) T8-11 POSTERIOR INSTRUMENTATION, T8-11 POSTEROLATERAL ARTHRODESIS (N/A) APPLICATION OF INTRAOPERATIVE CT SCAN (N/A) as a surgical intervention.  The patient's history has been reviewed, patient examined, no change in status, stable for surgery.  I have reviewed the patient's chart and labs.  Questions were answered to the patient's satisfaction.    Heart stable with stable rhythm, chronic cough with no worsening.   Lovenia Kim

## 2023-07-10 NOTE — Progress Notes (Signed)
Labs drawn at bedside by phlebotomy. Dr. Katrinka Blazing in to see patient. Doing well, having severe pain. Giving IV pain medications according to PACU orders. Patient is able to wiggle toes, move legs and bend knees.

## 2023-07-10 NOTE — Anesthesia Preprocedure Evaluation (Addendum)
Anesthesia Evaluation  Patient identified by MRN, date of birth, ID band Patient awake    Reviewed: Allergy & Precautions, NPO status , Patient's Chart, lab work & pertinent test results  History of Anesthesia Complications Negative for: history of anesthetic complications  Airway Mallampati: II  TM Distance: >3 FB Neck ROM: full    Dental  (+) Dental Advidsory Given, Edentulous Upper, Edentulous Lower   Pulmonary neg shortness of breath, neg sleep apnea, COPD,  COPD inhaler, neg recent URI, Current Smoker and Patient abstained from smoking.   Pulmonary exam normal        Cardiovascular hypertension, (-) angina + CAD, + Past MI and + Peripheral Vascular Disease  Normal cardiovascular exam(-) dysrhythmias (-) Valvular Problems/Murmurs Rhythm:Regular Rate:Normal  ECG 03/10/23: normal  TRANSTHORACIC ECHOCARDIOGRAM performed of 10/21/2022 1. Left ventricular ejection fraction, by estimation, is 60 to 65%. The left ventricle has normal function. The left ventricle has no regional  wall motion abnormalities. There is mild left ventricular hypertrophy. Left ventricular diastolic parameters were normal.  2. Right ventricular systolic function is normal. The right ventricular size is normal.  3. The mitral valve is normal in structure. Mild mitral valve regurgitation.  4. The aortic valve was not well visualized. Aortic valve regurgitation is not visualized. No aortic stenosis is present.  5. The inferior vena cava is normal in size with greater than 50% respiratory variability, suggesting right atrial pressure of 3 mmHg.    MYOCARDIAL PERFUSION IMAGING STUDY ([facility]) performed on 09/01/2019 1. Normal left ventricular systolic function with a normal LVEF of 55-65% 2. Normal myocardial thickening and wall motion 3. Left ventricular cavity size normal 4. SPECT images demonstrate homogenous tracer distribution throughout the myocardium 5. No  evidence of stress-induced myocardial ischemia or arrhythmia 6. Normal low risk study    Neuro/Psych neg Seizures PSYCHIATRIC DISORDERS Anxiety     TIA   GI/Hepatic ,GERD  Medicated,,(+) Hepatitis -, C  Endo/Other  negative endocrine ROSneg diabetes  Obesity   Renal/GU      Musculoskeletal   Abdominal   Peds  Hematology negative hematology ROS (+)   Anesthesia Other Findings Past Medical History: No date: Anemia No date: Anxiety No date: Aortic atherosclerosis (HCC) No date: Asthma 08/02/2015: Atypical chest pain No date: Carotid artery disease (HCC) No date: Chronic back pain No date: Chronic nausea 02/27/2016: Chronic, continuous use of opioids     Comment:  a.) has naloxone Rx available No date: COPD (chronic obstructive pulmonary disease) (HCC) No date: Coronary artery disease     Comment:  a.) LHC 2011: no sig CAD (report unavailable); b.) LHC               07/07/2014: 10-20% mLM, 25% LAD, 30-40% LCx, 20-30% RCA               -- med mgmt; c.) LHC 02/22/2016: 25% LM, 25% pLCx --> med              mgmt; d.) MPI 07/10/2017: no ischemia; e.) MPI               09/01/2019: no ischemia; f.) cCTA 10/28/2022: Ca score               1092 (99th percentile for age/sex/race match control) No date: Current smoker No date: DDD (degenerative disc disease), cervical     Comment:  a.) s/p ACDF C5-C7 and PSIF C5-T1 05/23/2020: DDD (degenerative disc disease), lumbar     Comment:  a.) s/p L5-S1  PLIF No date: GERD (gastroesophageal reflux disease) No date: Hepatic steatosis No date: Hepatitis C virus infection cured after antiviral drug  therapy     Comment:  a.) s/p Tx with standard glecaprevir-pibrentasvir course No date: Hypercholesteremia No date: Hyperkalemia No date: Hyperlipidemia No date: Hypertension No date: Hypokalemia 01/04/2014: Hypomagnesemia No date: Incomplete tear of left rotator cuff No date: Insomnia     Comment:  a.) on hypnotic PRN (zolpidem) 2012: MI  (myocardial infarction) (HCC) No date: Migraines 03/03/2014: MSSA (methicillin susceptible Staphylococcus aureus)  septicemia (HCC)     Comment:  a.) TEE (-) for valvular vegitation No date: Nerve pain     Comment:  per patient, in the lower back No date: Osteoarthritis No date: Pneumonia No date: PTSD (post-traumatic stress disorder) No date: PVD (peripheral vascular disease) (HCC) No date: Tendinitis of left rotator cuff 2014: TIA (transient ischemic attack)  Past Surgical History: 03/30/2018: ANTERIOR CERVICAL DECOMP/DISCECTOMY FUSION; N/A     Comment:  Procedure: Cervical six-seven Anterior cervical               decompression/discectomy/fusion;  Surgeon: Jadene Pierini, MD;  Location: [facility];  Service: Neurosurgery;                Laterality: N/A; 01/11/2019: ANTERIOR CERVICAL DECOMP/DISCECTOMY FUSION; N/A     Comment:  Procedure: Cervical Five-Six Anterior cervical               discectomy fusion,  Cervical Five to Cervical Seven               anterior instrumented fusion;  Surgeon: Jadene Pierini, MD;  Location: [facility] OR;  Service: Neurosurgery;                Laterality: N/A;  Cervical Five-Six Anterior cervical               discectomy fusion,  Cervical Five to Cervical Seven               anterior instrumented fusion 04/21/2023: BIOPSY     Comment:  Procedure: BIOPSY;  Surgeon: Toney Reil, MD;                Location: [facility] END[facility];  Service: Gastroenterology;; 02/22/2016: CARDIAC CATHETERIZATION; Left     Comment:  Procedure: Left Heart Cath and Coronary Angiography;                Surgeon: Alwyn Pea, MD;  Location: [facility]               CV [facility];  Service: Cardiovascular;  Laterality: Left; No date: CATARACT EXTRACTION W/ INTRAOCULAR LENS IMPLANT; Right 09/19/2016: COLONOSCOPY WITH PROPOFOL; N/A     Comment:  Procedure: COLONOSCOPY WITH PROPOFOL;  Surgeon: [name], MD;  Location: [facility];   Service: Endoscopy;              Laterality: N/A; 04/21/2023: COLONOSCOPY WITH PROPOFOL; N/A     Comment:  Procedure: COLONOSCOPY WITH PROPOFOL;  Surgeon: Toney Reil, MD;  Location: [facility] END[facility];  Service:               Gastroenterology;  Laterality: N/A;  No date: DIAGNOSTIC LAPAROSCOPY 09/18/2017: ESOPHAGOGASTRODUODENOSCOPY (EGD) WITH PROPOFOL; N/A     Comment:  Procedure: ESOPHAGOGASTRODUODENOSCOPY (EGD) WITH               PROPOFOL;  Surgeon: [name], MD;  Location: [facility]               ENDOSCOPY;  Service: Gastroenterology;  Laterality: N/A; 08/10/2019: ESOPHAGOGASTRODUODENOSCOPY (EGD) WITH PROPOFOL; N/A     Comment:  Procedure: ESOPHAGOGASTRODUODENOSCOPY (EGD) WITH               PROPOFOL;  Surgeon: [name], MD;  Location[facility]               ENDOSCOPY;  Service: Endoscopy;  Laterality: N/A; 04/21/2023: ESOPHAGOGASTRODUODENOSCOPY (EGD) WITH PROPOFOL; N/A     Comment:  Procedure: ESOPHAGOGASTRODUODENOSCOPY (EGD) WITH               PROPOFOL;  Surgeon: Toney Reil, MD;  Location:               [facility] ENDOSCOPY;  Service: Gastroenterology;  Laterality:               N/A; No date: HEMORRHOID SURGERY 07/07/2014: LEFT HEART CATH AND CORONARY ANGIOGRAPHY; Left 2011: LEFT HEART CATH AND CORONARY ANGIOGRAPHY; Left 11/05/2022: LUMBAR WOUND DEBRIDEMENT; N/A     Comment:  Procedure: Thoracic one spinous process removal and               wound revision;  Surgeon: Jadene Pierini, MD;                Location: [facility];  Service: Neurosurgery;  Laterality:               N/A; No date: NASAL SINUS SURGERY No date: ORIF FEMUR FRACTURE; Left No date: ORIF TIBIA & FIBULA FRACTURES; Right No date: OVARIAN CYST SURGERY 05/14/2021: POSTERIOR CERVICAL FUSION/FORAMINOTOMY; N/A     Comment:  Procedure: Cervical five, Cervical six               Laminectomy,foraminotomy with Cervical five-six Cervical               six-seven Cervical seven-Thoracic one Posterior                instrumented fusion;  Surgeon: Jadene Pierini, MD;                Location: [facility];  Service: Neurosurgery;  Laterality:               N/A; 05/28/2022: SHOULDER ARTHROSCOPY WITH SUBACROMIAL DECOMPRESSION,  ROTATOR CUFF REPAIR AND BICEP TENDON REPAIR; Left     Comment:  Procedure: SHOULDER ARTHROSCOPY WITH DEBRIDEMENT,               DECOMPRESSION, ROTATOR CUFF REPAIR AND BICEP TENDON               REPAIR;  Surgeon: Christena Flake, MD;  Location: [facility] ORS;              Service: Orthopedics;  Laterality: Left; 12/03/2022: SHOULDER ARTHROSCOPY WITH SUBACROMIAL DECOMPRESSION,  ROTATOR CUFF REPAIR AND BICEP TENDON REPAIR; Left     Comment:  Procedure: SHOULDER Extensive ARTHROSCOPY WITH               DEBRIDEMENT, removal of Retained Foreign Body;  Surgeon:               Christena Flake, MD;  Location: [facility] ORS;  Service:  Orthopedics;  Laterality: Left; 03/11/2023: SHOULDER ARTHROSCOPY WITH SUBACROMIAL DECOMPRESSION,  ROTATOR CUFF REPAIR AND BICEP TENDON REPAIR; Right     Comment:  Procedure: RIGHT SHOULDER ARTHROSCOPY WITH DEBRIDMENT,               DECOMPRESSION and BICEPS TENODESIS.;  Surgeon: Christena Flake, MD;  Location: [facility] ORS;  Service: Orthopedics;                Laterality: Right; 05/23/2020: TRANSFORAMINAL LUMBAR INTERBODY FUSION W/ MIS 1 LEVEL;  Right     Comment:  Procedure: Right Lumbar Five Sacral One Minimally               invasive transforaminal lumbar interbody fusion;                Surgeon: Jadene Pierini, MD;  Location: [facility];                Service: Neurosurgery;  Laterality: Right;  Right Lumbar               Five Sacral One Minimally invasive transforaminal lumbar               interbody fusion  BMI    Body Mass Index: 32.37 kg/m      Reproductive/Obstetrics negative OB ROS                             Anesthesia Physical Anesthesia Plan  ASA: 3  Anesthesia Plan: General   Post-op Pain  Management:    Induction: Intravenous  PONV Risk Score and Plan: 3 and Ondansetron, Dexamethasone and Midazolam  Airway Management Planned: Oral ETT  Additional Equipment: Arterial line  Intra-op Plan:   Post-operative Plan: Extubation in OR  Informed Consent: I have reviewed the patients History and Physical, chart, labs and discussed the procedure including the risks, benefits and alternatives for the proposed anesthesia with the patient or authorized representative who has indicated his/her understanding and acceptance.     Dental Advisory Given  Plan Discussed with: Anesthesiologist, CRNA and Surgeon  Anesthesia Plan Comments: (Patient consented for risks of anesthesia including but not limited to:  - adverse reactions to medications - damage to eyes, teeth, lips or other oral mucosa - nerve damage due to positioning  - sore throat or hoarseness - Damage to heart, brain, nerves, lungs, other parts of body or loss of life  Patient voiced understanding and assent.)       Anesthesia Quick Evaluation

## 2023-07-10 NOTE — Progress Notes (Signed)
Called Dr. Lorette Ang, patient is still crying in severe pain after fentanyl doses (IV). One time order of 0.5 mg dilaudid IV push given. Also, verbal order to d/c arterial line.

## 2023-07-10 NOTE — H&P (View-Only) (Signed)
Referring Physician:  No referring provider defined for this encounter.  Primary Physician:  Ronnald Ramp, MD  History of Present Illness: 04/30/23 [name] is here today with a chief complaint of failed back syndrome.  We had her scheduled for a spinal cord stimulator, and in doing so got a screening thoracic MRI which demonstrated significant thoracic compression secondary to disc extrusions.  She has a history of cervical myelopathy for which she has had a previous cervical decompression and fusion, however on her history she states that her upper and lower extremities got better after that surgery, however now mostly her lower extremities are worsening.  She does get shooting pains from the midline back that are electric in nature that go down to her buttocks and legs.  She feels like her gait is worsening.  Feels like her strength is worsening.    I have utilized the care everywhere function in epic to review the outside records available from external health systems.  Review of Systems:  A 10 point review of systems is negative, except for the pertinent positives and negatives detailed in the HPI.  Past Medical History: Past Medical History:  Diagnosis Date   Anemia    Anxiety    Aortic atherosclerosis (HCC)    Asthma    Atypical chest pain 08/02/2015   Carotid artery disease (HCC)    Chronic back pain    Chronic nausea    Chronic, continuous use of opioids 02/27/2016   a.) has naloxone Rx available   COPD (chronic obstructive pulmonary disease) (HCC)    Coronary artery disease    a.) LHC 2011: no sig CAD (report unavailable); b.) LHC 07/07/2014: 10-20% mLM, 25% LAD, 30-40% LCx, 20-30% RCA -- med mgmt; c.) LHC 02/22/2016: 25% LM, 25% pLCx --> med mgmt; d.) MPI 07/10/2017: no ischemia; e.) MPI 09/01/2019: no ischemia; f.) cCTA 10/28/2022: Ca score 1092 (99th percentile for age/sex/race match control)   Current smoker    DDD (degenerative disc  disease), cervical    a.) s/p ACDF C5-C7 and PSIF C5-T1   DDD (degenerative disc disease), lumbar 05/23/2020   a.) s/p L5-S1 PLIF   GERD (gastroesophageal reflux disease)    Hepatic steatosis    Hepatitis C virus infection cured after antiviral drug therapy    a.) s/p Tx with standard glecaprevir-pibrentasvir course   Hypercholesteremia    Hyperkalemia    Hyperlipidemia    Hypertension    Hypokalemia    Hypomagnesemia 01/04/2014   Incomplete tear of left rotator cuff    Insomnia    a.) on hypnotic PRN (zolpidem)   MI (myocardial infarction) (HCC) 2012   Migraines    MSSA (methicillin susceptible Staphylococcus aureus) septicemia (HCC) 03/03/2014   a.) TEE (-) for valvular vegitation   Nerve pain    per patient, in the lower back   Osteoarthritis    Pneumonia    PTSD (post-traumatic stress disorder)    PVD (peripheral vascular disease) (HCC)    Tendinitis of left rotator cuff    TIA (transient ischemic attack) 2014    Past Surgical History: Past Surgical History:  Procedure Laterality Date   ANTERIOR CERVICAL DECOMP/DISCECTOMY FUSION N/A 03/30/2018   Procedure: Cervical six-seven Anterior cervical decompression/discectomy/fusion;  Surgeon: Jadene Pierini, MD;  Location: [facility] OR;  Service: Neurosurgery;  Laterality: N/A;   ANTERIOR CERVICAL DECOMP/DISCECTOMY FUSION N/A 01/11/2019   Procedure: Cervical Five-Six Anterior cervical discectomy fusion,  Cervical Five to Cervical Seven anterior instrumented fusion;  Surgeon: Jadene Pierini, MD;  Location: [facility] OR;  Service: Neurosurgery;  Laterality: N/A;  Cervical Five-Six Anterior cervical discectomy fusion,  Cervical Five to Cervical Seven anterior instrumented fusion   BIOPSY  04/21/2023   Procedure: BIOPSY;  Surgeon: Toney Reil, MD;  Location: [facility] ENDOSCOPY;  Service: Gastroenterology;;   CARDIAC CATHETERIZATION Left 02/22/2016   Procedure: Left Heart Cath and Coronary Angiography;  Surgeon: Alwyn Pea, MD;   Location: [facility] INVASIVE CV LAB;  Service: Cardiovascular;  Laterality: Left;   CATARACT EXTRACTION W/ INTRAOCULAR LENS IMPLANT Right    COLONOSCOPY WITH PROPOFOL N/A 09/19/2016   Procedure: COLONOSCOPY WITH PROPOFOL;  Surgeon: [name], MD;  Location: [facility] ENDOSCOPY;  Service: Endoscopy;  Laterality: N/A;   COLONOSCOPY WITH PROPOFOL N/A 04/21/2023   Procedure: COLONOSCOPY WITH PROPOFOL;  Surgeon: Toney Reil, MD;  Location: [facility] ENDOSCOPY;  Service: Gastroenterology;  Laterality: N/A;   DIAGNOSTIC LAPAROSCOPY     ESOPHAGOGASTRODUODENOSCOPY (EGD) WITH PROPOFOL N/A 09/18/2017   Procedure: ESOPHAGOGASTRODUODENOSCOPY (EGD) WITH PROPOFOL;  Surgeon: [name], MD;  Location: [facility] ENDOSCOPY;  Service: Gastroenterology;  Laterality: N/A;   ESOPHAGOGASTRODUODENOSCOPY (EGD) WITH PROPOFOL N/A 08/10/2019   Procedure: ESOPHAGOGASTRODUODENOSCOPY (EGD) WITH PROPOFOL;  Surgeon: [name], MD;  Location: [facility] ENDOSCOPY;  Service: Endoscopy;  Laterality: N/A;   ESOPHAGOGASTRODUODENOSCOPY (EGD) WITH PROPOFOL N/A 04/21/2023   Procedure: ESOPHAGOGASTRODUODENOSCOPY (EGD) WITH PROPOFOL;  Surgeon: Toney Reil, MD;  Location: [facility] ENDOSCOPY;  Service: Gastroenterology;  Laterality: N/A;   HEMORRHOID SURGERY     LEFT HEART CATH AND CORONARY ANGIOGRAPHY Left 07/07/2014   LEFT HEART CATH AND CORONARY ANGIOGRAPHY Left 2011   LUMBAR WOUND DEBRIDEMENT N/A 11/05/2022   Procedure: Thoracic one spinous process removal and wound revision;  Surgeon: Jadene Pierini, MD;  Location: [facility] OR;  Service: Neurosurgery;  Laterality: N/A;   NASAL SINUS SURGERY     ORIF FEMUR FRACTURE Left    ORIF TIBIA & FIBULA FRACTURES Right    OVARIAN CYST SURGERY     POSTERIOR CERVICAL FUSION/FORAMINOTOMY N/A 05/14/2021   Procedure: Cervical five, Cervical six Laminectomy,foraminotomy with Cervical five-six Cervical six-seven Cervical seven-Thoracic one Posterior instrumented fusion;  Surgeon: Jadene Pierini, MD;   Location: [facility] OR;  Service: Neurosurgery;  Laterality: N/A;   SHOULDER ARTHROSCOPY WITH SUBACROMIAL DECOMPRESSION, ROTATOR CUFF REPAIR AND BICEP TENDON REPAIR Left 05/28/2022   Procedure: SHOULDER ARTHROSCOPY WITH DEBRIDEMENT, DECOMPRESSION, ROTATOR CUFF REPAIR AND BICEP TENDON REPAIR;  Surgeon: Christena Flake, MD;  Location: [facility] ORS;  Service: Orthopedics;  Laterality: Left;   SHOULDER ARTHROSCOPY WITH SUBACROMIAL DECOMPRESSION, ROTATOR CUFF REPAIR AND BICEP TENDON REPAIR Left 12/03/2022   Procedure: SHOULDER Extensive ARTHROSCOPY WITH DEBRIDEMENT, removal of Retained Foreign Body;  Surgeon: Christena Flake, MD;  Location: [facility] ORS;  Service: Orthopedics;  Laterality: Left;   SHOULDER ARTHROSCOPY WITH SUBACROMIAL DECOMPRESSION, ROTATOR CUFF REPAIR AND BICEP TENDON REPAIR Right 03/11/2023   Procedure: RIGHT SHOULDER ARTHROSCOPY WITH DEBRIDMENT, DECOMPRESSION and BICEPS TENODESIS.;  Surgeon: Christena Flake, MD;  Location: [facility] ORS;  Service: Orthopedics;  Laterality: Right;   TRANSFORAMINAL LUMBAR INTERBODY FUSION W/ MIS 1 LEVEL Right 05/23/2020   Procedure: Right Lumbar Five Sacral One Minimally invasive transforaminal lumbar interbody fusion;  Surgeon: Jadene Pierini, MD;  Location: [facility] OR;  Service: Neurosurgery;  Laterality: Right;  Right Lumbar Five Sacral One Minimally invasive transforaminal lumbar interbody fusion    Allergies: Allergies as of 06/10/2023 - Review Complete 06/03/2023  Allergen Reaction Noted   Cyclobenzaprine Hives, Swelling, and Rash 11/13/2013  Levofloxacin Hives, Swelling, and Rash 02/27/2012   Chlorpheniramine maleate  02/12/2022   Phenergan [promethazine hcl] Other (See Comments) 02/21/2017   Phenylephrine hcl Other (See Comments) 02/12/2022   Toradol [ketorolac tromethamine] Swelling and Other (See Comments) 01/17/2015   Zoloft [sertraline hcl] Swelling 05/31/2015   Cephalexin Hives and Rash 10/22/2010   Ketorolac Rash 10/22/2010   Tramadol Hives, Swelling, Other  (See Comments), and Rash 01/17/2015    Medications:  Current Facility-Administered Medications:    ceFAZolin (ANCEF) IVPB 2g/100 mL premix, 2 g, Intravenous, 60 min Pre-Op, Lovenia Kim, MD   lactated ringers infusion, , Intravenous, Continuous, Corinda Gubler, MD, Last Rate: 10 mL/hr at 07/10/23 0636, New Bag at 07/10/23 0636   vancomycin (VANCOCIN) IVPB 1000 mg/200 mL premix, 1,000 mg, Intravenous, Once, Lovenia Kim, MD, Last Rate: 200 mL/hr at 07/10/23 0636, 1,000 mg at 07/10/23 0636  Social History: Social History   Tobacco Use   Smoking status: Every Day    Current packs/day: 0.50    Average packs/day: 0.5 packs/day for 35.0 years (17.5 ttl pk-yrs)    Types: Cigarettes   Smokeless tobacco: Never  Vaping Use   Vaping status: Former   Quit date: 06/24/2013  Substance Use Topics   Alcohol use: Not Currently    Alcohol/week: 0.0 standard drinks of alcohol   Drug use: Not Currently    Family Medical History: Family History  Problem Relation Age of Onset   Heart disease Mother    Lung cancer Mother    Ovarian cancer Mother    Healthy Brother    Healthy Brother    Healthy Brother    Diabetes Maternal Uncle    Breast cancer Maternal Aunt     Physical Examination: Vitals:   07/10/23 0613  BP: (!) 149/83  Pulse: 67  Resp: 16  Temp: 98 F (36.7 C)  SpO2: 97%    General: Patient is in no apparent distress. Attention to examination is appropriate.  Neck:   Supple.  Full range of motion.  Respiratory: Patient is breathing without any difficulty.   NEUROLOGICAL:     Awake, alert, oriented to person, place, and time.  Speech is clear and fluent.   Cranial Nerves: Pupils equal round and reactive to light.  Facial tone is symmetric.  Facial sensation is symmetric. Shoulder shrug is symmetric. Tongue protrusion is midline.    Strength:  Side Iliopsoas Quads Hamstring PF DF EHL  R 5 5 5 5 5 5   L 5 5 5 5 5 5    At the bilateral Achilles she has trace  reflexes, at the bilateral knees she is hyperreflexic with crossed adductor sign.  She does not have clonus, her Babinski sign is questionable.  Patchy sensation loss in the bilateral lower extremities  Regular walking gait with a forward head carriage, however she cannot tandem walk nor can she turn quickly without use of supports  Imaging: Narrative & Impression  CLINICAL DATA:  Pain. Assess for stenosis prior to spinal cord stimulator placement   EXAM: MRI THORACIC SPINE WITHOUT CONTRAST   TECHNIQUE: Multiplanar, multisequence MR imaging of the thoracic spine was performed. No intravenous contrast was administered.   COMPARISON:  No prior thoracic imaging.   FINDINGS: Alignment:  No thoracic malalignment.   Vertebrae: No thoracic region fracture or focal bone lesion.   Cord:  No primary cord lesion.  See below regarding stenosis.   Paraspinal and other soft tissues: Negative   Disc levels:   Ordinary mild disc degeneration  and bulging from T1-2 through T4-5.   T5-6: Normal interspace.   T6-7: Disc bulge more prominent towards the left.  No stenosis.   T7-8: Shallow disc protrusion narrows the ventral subarachnoid space but does not compress the cord. No foraminal stenosis.   T8-9: Large bilobed disc herniation, more prominent to the right of midline. Effacement of the subarachnoid space. Indentation of the ventral cord, particularly on the right. Mild bilateral foraminal narrowing.   T9-10: Right paracentral disc herniation effaces the ventral subarachnoid space but does not affect the cord. Mild right foraminal narrowing.   T10-11: No disc abnormality.  Mild facet and ligamentous prominence.   T11-12: Mild bulging of the disc. Mild facet and ligamentous prominence.   T12-L1: Normal interspace.   IMPRESSION: 1. T8-9: Large bilobed disc herniation, more prominent to the right of midline. Effacement of the subarachnoid space. Indentation of the ventral  cord, particularly on the right. Mild bilateral foraminal narrowing. 2. T9-10: Right paracentral disc herniation effaces the ventral subarachnoid space but does not affect the cord. Mild right foraminal narrowing. 3. T7-8: Shallow disc protrusion narrows the ventral subarachnoid space but does not compress the cord. 4. T6-7: Disc bulge more prominent towards the left. No stenosis.     Electronically Signed   By: Paulina Fusi M.D.   On: 04/15/2023 16:31     I have personally reviewed the images and agree with the above interpretation.  Medical Decision Making/Assessment and Plan: Ms. Sweat is a pleasant 58 y.o. female with history of a prior cervical fusion as well as a prior lumbar fusion.  She has continued to have significant back and leg pain diagnosed with failed back syndrome.  She has had psychiatric clearance as well as a prior spinal cord stimulator trial which gave her over 50% reduction in her pain.  This was done in June of this year.  Since it was removed she continues to have her pain.  As part of her clearing process we obtained a thoracic MRI which interestingly showed 2 compressive disc herniations, given the fact that most of her pain is down the right side and she does have symptoms consistent with thoracic myelopathy we feel that she would benefit from a decompression and fusion of her thoracic spine either prior to placement of a thoracic spinal cord stimulator  Once we have discussed this with our team will move forward giving her time to quit smoking.  However given her myelopathy it is important that we move forward.   Will plan on a T8-T11 posterior spinal instrumented fusion, arthrodesis, and T8-9 and T9-10 transpedicular decompression and discectomy.  Risks and benefits were discussed with the patient.  She would like to go forward with the procedure.  Lovenia Kim, MD

## 2023-07-10 NOTE — Anesthesia Postprocedure Evaluation (Signed)
Anesthesia Post Note  Patient: Sara Munoz  Procedure(s) Performed: T8-9 & T9-10 TRANSPEDICULAR DECOMPRESSION/DISCECTOMY T8-11 POSTERIOR INSTRUMENTATION, T8-11 POSTEROLATERAL ARTHRODESIS APPLICATION OF INTRAOPERATIVE CT SCAN  Patient location during evaluation: PACU Anesthesia Type: General Level of consciousness: awake and alert Pain management: pain level controlled Vital Signs Assessment: post-procedure vital signs reviewed and stable Respiratory status: spontaneous breathing, nonlabored ventilation, respiratory function stable and patient connected to nasal cannula oxygen Cardiovascular status: blood pressure returned to baseline and stable Postop Assessment: no apparent nausea or vomiting Anesthetic complications: no  No notable events documented.   Last Vitals:  Vitals:   07/10/23 1345 07/10/23 1400  BP: 135/81 123/74  Pulse: 82 73  Resp: 13 11  Temp:    SpO2: 97% 98%    Last Pain:  Vitals:   07/10/23 1400  TempSrc:   PainSc: 10-Worst pain ever                 [employer] [facility] [employer]

## 2023-07-10 NOTE — Progress Notes (Signed)
Referring Physician:  No referring provider defined for this encounter.  Primary Physician:  Ronnald Ramp, MD  History of Present Illness: 04/30/23 Sara Munoz is here today with a chief complaint of failed back syndrome.  We had her scheduled for a spinal cord stimulator, and in doing so got a screening thoracic MRI which demonstrated significant thoracic compression secondary to disc extrusions.  She has a history of cervical myelopathy for which she has had a previous cervical decompression and fusion, however on her history she states that her upper and lower extremities got better after that surgery, however now mostly her lower extremities are worsening.  She does get shooting pains from the midline back that are electric in nature that go down to her buttocks and legs.  She feels like her gait is worsening.  Feels like her strength is worsening.    I have utilized the care everywhere function in epic to review the outside records available from external health systems.  Review of Systems:  A 10 point review of systems is negative, except for the pertinent positives and negatives detailed in the HPI.  Past Medical History: Past Medical History:  Diagnosis Date   Anemia    Anxiety    Aortic atherosclerosis (HCC)    Asthma    Atypical chest pain 08/02/2015   Carotid artery disease (HCC)    Chronic back pain    Chronic nausea    Chronic, continuous use of opioids 02/27/2016   a.) has naloxone Rx available   COPD (chronic obstructive pulmonary disease) (HCC)    Coronary artery disease    a.) LHC 2011: no sig CAD (report unavailable); b.) LHC 07/07/2014: 10-20% mLM, 25% LAD, 30-40% LCx, 20-30% RCA -- med mgmt; c.) LHC 02/22/2016: 25% LM, 25% pLCx --> med mgmt; d.) MPI 07/10/2017: no ischemia; e.) MPI 09/01/2019: no ischemia; f.) cCTA 10/28/2022: Ca score 1092 (99th percentile for age/sex/race match control)   Current smoker    DDD (degenerative disc  disease), cervical    a.) s/p ACDF C5-C7 and PSIF C5-T1   DDD (degenerative disc disease), lumbar 05/23/2020   a.) s/p L5-S1 PLIF   GERD (gastroesophageal reflux disease)    Hepatic steatosis    Hepatitis C virus infection cured after antiviral drug therapy    a.) s/p Tx with standard glecaprevir-pibrentasvir course   Hypercholesteremia    Hyperkalemia    Hyperlipidemia    Hypertension    Hypokalemia    Hypomagnesemia 01/04/2014   Incomplete tear of left rotator cuff    Insomnia    a.) on hypnotic PRN (zolpidem)   MI (myocardial infarction) (HCC) 2012   Migraines    MSSA (methicillin susceptible Staphylococcus aureus) septicemia (HCC) 03/03/2014   a.) TEE (-) for valvular vegitation   Nerve pain    per patient, in the lower back   Osteoarthritis    Pneumonia    PTSD (post-traumatic stress disorder)    PVD (peripheral vascular disease) (HCC)    Tendinitis of left rotator cuff    TIA (transient ischemic attack) 2014    Past Surgical History: Past Surgical History:  Procedure Laterality Date   ANTERIOR CERVICAL DECOMP/DISCECTOMY FUSION N/A 03/30/2018   Procedure: Cervical six-seven Anterior cervical decompression/discectomy/fusion;  Surgeon: Jadene Pierini, MD;  Location: [facility] OR;  Service: Neurosurgery;  Laterality: N/A;   ANTERIOR CERVICAL DECOMP/DISCECTOMY FUSION N/A 01/11/2019   Procedure: Cervical Five-Six Anterior cervical discectomy fusion,  Cervical Five to Cervical Seven anterior instrumented fusion;  Surgeon: Jadene Pierini, MD;  Location: [facility] OR;  Service: Neurosurgery;  Laterality: N/A;  Cervical Five-Six Anterior cervical discectomy fusion,  Cervical Five to Cervical Seven anterior instrumented fusion   BIOPSY  04/21/2023   Procedure: BIOPSY;  Surgeon: Toney Reil, MD;  Location: [facility] ENDOSCOPY;  Service: Gastroenterology;;   CARDIAC CATHETERIZATION Left 02/22/2016   Procedure: Left Heart Cath and Coronary Angiography;  Surgeon: Alwyn Pea, MD;   Location: [facility] INVASIVE CV LAB;  Service: Cardiovascular;  Laterality: Left;   CATARACT EXTRACTION W/ INTRAOCULAR LENS IMPLANT Right    COLONOSCOPY WITH PROPOFOL N/A 09/19/2016   Procedure: COLONOSCOPY WITH PROPOFOL;  Surgeon: [name], MD;  Location: [facility] ENDOSCOPY;  Service: Endoscopy;  Laterality: N/A;   COLONOSCOPY WITH PROPOFOL N/A 04/21/2023   Procedure: COLONOSCOPY WITH PROPOFOL;  Surgeon: Toney Reil, MD;  Location: [facility] ENDOSCOPY;  Service: Gastroenterology;  Laterality: N/A;   DIAGNOSTIC LAPAROSCOPY     ESOPHAGOGASTRODUODENOSCOPY (EGD) WITH PROPOFOL N/A 09/18/2017   Procedure: ESOPHAGOGASTRODUODENOSCOPY (EGD) WITH PROPOFOL;  Surgeon: [name], MD;  Location: [facility] ENDOSCOPY;  Service: Gastroenterology;  Laterality: N/A;   ESOPHAGOGASTRODUODENOSCOPY (EGD) WITH PROPOFOL N/A 08/10/2019   Procedure: ESOPHAGOGASTRODUODENOSCOPY (EGD) WITH PROPOFOL;  Surgeon: [name], MD;  Location: [facility] ENDOSCOPY;  Service: Endoscopy;  Laterality: N/A;   ESOPHAGOGASTRODUODENOSCOPY (EGD) WITH PROPOFOL N/A 04/21/2023   Procedure: ESOPHAGOGASTRODUODENOSCOPY (EGD) WITH PROPOFOL;  Surgeon: Toney Reil, MD;  Location: [facility] ENDOSCOPY;  Service: Gastroenterology;  Laterality: N/A;   HEMORRHOID SURGERY     LEFT HEART CATH AND CORONARY ANGIOGRAPHY Left 07/07/2014   LEFT HEART CATH AND CORONARY ANGIOGRAPHY Left 2011   LUMBAR WOUND DEBRIDEMENT N/A 11/05/2022   Procedure: Thoracic one spinous process removal and wound revision;  Surgeon: Jadene Pierini, MD;  Location: [facility] OR;  Service: Neurosurgery;  Laterality: N/A;   NASAL SINUS SURGERY     ORIF FEMUR FRACTURE Left    ORIF TIBIA & FIBULA FRACTURES Right    OVARIAN CYST SURGERY     POSTERIOR CERVICAL FUSION/FORAMINOTOMY N/A 05/14/2021   Procedure: Cervical five, Cervical six Laminectomy,foraminotomy with Cervical five-six Cervical six-seven Cervical seven-Thoracic one Posterior instrumented fusion;  Surgeon: Jadene Pierini, MD;   Location: [facility] OR;  Service: Neurosurgery;  Laterality: N/A;   SHOULDER ARTHROSCOPY WITH SUBACROMIAL DECOMPRESSION, ROTATOR CUFF REPAIR AND BICEP TENDON REPAIR Left 05/28/2022   Procedure: SHOULDER ARTHROSCOPY WITH DEBRIDEMENT, DECOMPRESSION, ROTATOR CUFF REPAIR AND BICEP TENDON REPAIR;  Surgeon: Christena Flake, MD;  Location: [facility] ORS;  Service: Orthopedics;  Laterality: Left;   SHOULDER ARTHROSCOPY WITH SUBACROMIAL DECOMPRESSION, ROTATOR CUFF REPAIR AND BICEP TENDON REPAIR Left 12/03/2022   Procedure: SHOULDER Extensive ARTHROSCOPY WITH DEBRIDEMENT, removal of Retained Foreign Body;  Surgeon: Christena Flake, MD;  Location: [facility] ORS;  Service: Orthopedics;  Laterality: Left;   SHOULDER ARTHROSCOPY WITH SUBACROMIAL DECOMPRESSION, ROTATOR CUFF REPAIR AND BICEP TENDON REPAIR Right 03/11/2023   Procedure: RIGHT SHOULDER ARTHROSCOPY WITH DEBRIDMENT, DECOMPRESSION and BICEPS TENODESIS.;  Surgeon: Christena Flake, MD;  Location: [facility] ORS;  Service: Orthopedics;  Laterality: Right;   TRANSFORAMINAL LUMBAR INTERBODY FUSION W/ MIS 1 LEVEL Right 05/23/2020   Procedure: Right Lumbar Five Sacral One Minimally invasive transforaminal lumbar interbody fusion;  Surgeon: Jadene Pierini, MD;  Location: [facility] OR;  Service: Neurosurgery;  Laterality: Right;  Right Lumbar Five Sacral One Minimally invasive transforaminal lumbar interbody fusion    Allergies: Allergies as of 06/10/2023 - Review Complete 06/03/2023  Allergen Reaction Noted   Cyclobenzaprine Hives, Swelling, and Rash 11/13/2013  Levofloxacin Hives, Swelling, and Rash 02/27/2012   Chlorpheniramine maleate  02/12/2022   Phenergan [promethazine hcl] Other (See Comments) 02/21/2017   Phenylephrine hcl Other (See Comments) 02/12/2022   Toradol [ketorolac tromethamine] Swelling and Other (See Comments) 01/17/2015   Zoloft [sertraline hcl] Swelling 05/31/2015   Cephalexin Hives and Rash 10/22/2010   Ketorolac Rash 10/22/2010   Tramadol Hives, Swelling, Other  (See Comments), and Rash 01/17/2015    Medications:  Current Facility-Administered Medications:    ceFAZolin (ANCEF) IVPB 2g/100 mL premix, 2 g, Intravenous, 60 min Pre-Op, Lovenia Kim, MD   lactated ringers infusion, , Intravenous, Continuous, Corinda Gubler, MD, Last Rate: 10 mL/hr at 07/10/23 0636, New Bag at 07/10/23 0636   vancomycin (VANCOCIN) IVPB 1000 mg/200 mL premix, 1,000 mg, Intravenous, Once, Lovenia Kim, MD, Last Rate: 200 mL/hr at 07/10/23 0636, 1,000 mg at 07/10/23 0636  Social History: Social History   Tobacco Use   Smoking status: Every Day    Current packs/day: 0.50    Average packs/day: 0.5 packs/day for 35.0 years (17.5 ttl pk-yrs)    Types: Cigarettes   Smokeless tobacco: Never  Vaping Use   Vaping status: Former   Quit date: 06/24/2013  Substance Use Topics   Alcohol use: Not Currently    Alcohol/week: 0.0 standard drinks of alcohol   Drug use: Not Currently    Family Medical History: Family History  Problem Relation Age of Onset   Heart disease Mother    Lung cancer Mother    Ovarian cancer Mother    Healthy Brother    Healthy Brother    Healthy Brother    Diabetes Maternal Uncle    Breast cancer Maternal Aunt     Physical Examination: Vitals:   07/10/23 0613  BP: (!) 149/83  Pulse: 67  Resp: 16  Temp: 98 F (36.7 C)  SpO2: 97%    General: Patient is in no apparent distress. Attention to examination is appropriate.  Neck:   Supple.  Full range of motion.  Respiratory: Patient is breathing without any difficulty.   NEUROLOGICAL:     Awake, alert, oriented to person, place, and time.  Speech is clear and fluent.   Cranial Nerves: Pupils equal round and reactive to light.  Facial tone is symmetric.  Facial sensation is symmetric. Shoulder shrug is symmetric. Tongue protrusion is midline.    Strength:  Side Iliopsoas Quads Hamstring PF DF EHL  R 5 5 5 5 5 5   L 5 5 5 5 5 5    At the bilateral Achilles she has trace  reflexes, at the bilateral knees she is hyperreflexic with crossed adductor sign.  She does not have clonus, her Babinski sign is questionable.  Patchy sensation loss in the bilateral lower extremities  Regular walking gait with a forward head carriage, however she cannot tandem walk nor can she turn quickly without use of supports  Imaging: Narrative & Impression  CLINICAL DATA:  Pain. Assess for stenosis prior to spinal cord stimulator placement   EXAM: MRI THORACIC SPINE WITHOUT CONTRAST   TECHNIQUE: Multiplanar, multisequence MR imaging of the thoracic spine was performed. No intravenous contrast was administered.   COMPARISON:  No prior thoracic imaging.   FINDINGS: Alignment:  No thoracic malalignment.   Vertebrae: No thoracic region fracture or focal bone lesion.   Cord:  No primary cord lesion.  See below regarding stenosis.   Paraspinal and other soft tissues: Negative   Disc levels:   Ordinary mild disc degeneration  and bulging from T1-2 through T4-5.   T5-6: Normal interspace.   T6-7: Disc bulge more prominent towards the left.  No stenosis.   T7-8: Shallow disc protrusion narrows the ventral subarachnoid space but does not compress the cord. No foraminal stenosis.   T8-9: Large bilobed disc herniation, more prominent to the right of midline. Effacement of the subarachnoid space. Indentation of the ventral cord, particularly on the right. Mild bilateral foraminal narrowing.   T9-10: Right paracentral disc herniation effaces the ventral subarachnoid space but does not affect the cord. Mild right foraminal narrowing.   T10-11: No disc abnormality.  Mild facet and ligamentous prominence.   T11-12: Mild bulging of the disc. Mild facet and ligamentous prominence.   T12-L1: Normal interspace.   IMPRESSION: 1. T8-9: Large bilobed disc herniation, more prominent to the right of midline. Effacement of the subarachnoid space. Indentation of the ventral  cord, particularly on the right. Mild bilateral foraminal narrowing. 2. T9-10: Right paracentral disc herniation effaces the ventral subarachnoid space but does not affect the cord. Mild right foraminal narrowing. 3. T7-8: Shallow disc protrusion narrows the ventral subarachnoid space but does not compress the cord. 4. T6-7: Disc bulge more prominent towards the left. No stenosis.     Electronically Signed   By: Paulina Fusi M.D.   On: 04/15/2023 16:31     I have personally reviewed the images and agree with the above interpretation.  Medical Decision Making/Assessment and Plan: Ms. Sweat is a pleasant 58 y.o. female with history of a prior cervical fusion as well as a prior lumbar fusion.  She has continued to have significant back and leg pain diagnosed with failed back syndrome.  She has had psychiatric clearance as well as a prior spinal cord stimulator trial which gave her over 50% reduction in her pain.  This was done in June of this year.  Since it was removed she continues to have her pain.  As part of her clearing process we obtained a thoracic MRI which interestingly showed 2 compressive disc herniations, given the fact that most of her pain is down the right side and she does have symptoms consistent with thoracic myelopathy we feel that she would benefit from a decompression and fusion of her thoracic spine either prior to placement of a thoracic spinal cord stimulator  Once we have discussed this with our team will move forward giving her time to quit smoking.  However given her myelopathy it is important that we move forward.   Will plan on a T8-T11 posterior spinal instrumented fusion, arthrodesis, and T8-9 and T9-10 transpedicular decompression and discectomy.  Risks and benefits were discussed with the patient.  She would like to go forward with the procedure.  Lovenia Kim, MD

## 2023-07-10 NOTE — Transfer of Care (Signed)
Immediate Anesthesia Transfer of Care Note  Patient: Sara Munoz  Procedure(s) Performed: T8-9 & T9-10 TRANSPEDICULAR DECOMPRESSION/DISCECTOMY T8-11 POSTERIOR INSTRUMENTATION, T8-11 POSTEROLATERAL ARTHRODESIS APPLICATION OF INTRAOPERATIVE CT SCAN  Patient Location: PACU  Anesthesia Type:General  Level of Consciousness: drowsy and patient cooperative  Airway & Oxygen Therapy: Patient Spontanous Breathing and Patient connected to face mask oxygen  Post-op Assessment: Report given to RN and Post -op Vital signs reviewed and stable  Post vital signs: Reviewed and stable  Last Vitals:  Vitals Value Taken Time  BP 124/77 07/10/23 1300  Temp 36.1 C 07/10/23 1255  Pulse 83 07/10/23 1305  Resp 13 07/10/23 1305  SpO2 100 % 07/10/23 1305  Vitals shown include unfiled device data.  Last Pain:  Vitals:   07/10/23 1255  TempSrc:   PainSc: Asleep         Complications: No notable events documented.

## 2023-07-10 NOTE — Anesthesia Procedure Notes (Signed)
Arterial Line Insertion Start/End1/16/2025 7:25 AM, 07/10/2023 7:28 AM Performed by: Stephanie Coup, MD, anesthesiologist  Patient location: Pre-op. Preanesthetic checklist: patient identified, IV checked, site marked, risks and benefits discussed, surgical consent, monitors and equipment checked, pre-op evaluation, timeout performed and anesthesia consent Lidocaine 1% used for infiltration Left, radial was placed Catheter size: 20 G Hand hygiene performed  and maximum sterile barriers used   Attempts: 1 Procedure performed using ultrasound guided technique. Ultrasound Notes:anatomy identified, needle tip was noted to be adjacent to the nerve/plexus identified and no ultrasound evidence of intravascular and/or intraneural injection Following insertion, dressing applied and Biopatch. Post procedure assessment: normal and unchanged  Patient tolerated the procedure well with no immediate complications.

## 2023-07-11 ENCOUNTER — Encounter: Payer: Self-pay | Admitting: Neurosurgery

## 2023-07-11 ENCOUNTER — Ambulatory Visit: Payer: 59 | Admitting: Cardiology

## 2023-07-11 LAB — CBC
HCT: 28.7 % — ABNORMAL LOW (ref 36.0–46.0)
Hemoglobin: 8.8 g/dL — ABNORMAL LOW (ref 12.0–15.0)
MCH: 22.6 pg — ABNORMAL LOW (ref 26.0–34.0)
MCHC: 30.7 g/dL (ref 30.0–36.0)
MCV: 73.6 fL — ABNORMAL LOW (ref 80.0–100.0)
Platelets: 222 10*3/uL (ref 150–400)
RBC: 3.9 MIL/uL (ref 3.87–5.11)
RDW: 17.1 % — ABNORMAL HIGH (ref 11.5–15.5)
WBC: 14.2 10*3/uL — ABNORMAL HIGH (ref 4.0–10.5)
nRBC: 0 % (ref 0.0–0.2)

## 2023-07-11 MED ORDER — DIAZEPAM 2 MG PO TABS
2.0000 mg | ORAL_TABLET | Freq: Once | ORAL | Status: AC
  Administered 2023-07-11: 2 mg via ORAL
  Filled 2023-07-11: qty 1

## 2023-07-11 NOTE — Discharge Instructions (Signed)
Your surgeon has performed an operation on your spine  to relieve pressure on one or more nerves. Many times, patients feel better immediately after surgery and can "overdo it." Even if you feel well, it is important that you follow these activity guidelines. If you do not let your back heal properly from the surgery, you can increase the chance of hardware complications and/or return of your symptoms. The following are instructions to help in your recovery once you have been discharged from the hospital.  Do not use NSAIDs for 3 months after surgery.   Activity    No bending, lifting, or twisting ("BLT"). Avoid lifting objects heavier than 10 pounds (gallon milk jug).  Where possible, avoid household activities that involve lifting, bending, pushing, or pulling such as laundry, vacuuming, grocery shopping, and childcare. Try to arrange for help from friends and family for these activities while your back heals.  Increase physical activity slowly as tolerated.  Taking short walks is encouraged, but avoid strenuous exercise. Do not jog, run, bicycle, lift weights, or participate in any other exercises unless specifically allowed by your doctor. Avoid prolonged sitting, including car rides.  Talk to your doctor before resuming sexual activity.  You should not drive until cleared by your doctor.  Until released by your doctor, you should not return to work or school.  You should rest at home and let your body heal.   You may shower three days after your surgery.  After showering, lightly dab your incision dry. Do not take a tub bath or go swimming for 3 weeks, or until approved by your doctor at your follow-up appointment.  If you smoke, we strongly recommend that you quit.  Smoking has been proven to interfere with normal healing in your back and will dramatically reduce the success rate of your surgery. Please contact [facility] (800-QUIT-NOW) and use the resources at www.[facility].com for  assistance in stopping smoking.  Surgical Incision   If you have a dressing on your incision, you may remove it three days after your surgery. Keep your incision area clean and dry.  Your incision was closed with Dermabond glue. The glue should begin to peel away within about a week.  Diet            You may return to your usual diet. Be sure to stay hydrated.  When to Contact us  Although your surgery and recovery will likely be uneventful, you may have some residual numbness, aches, and pains in your back and/or legs. This is normal and should improve in the next few weeks.  However, should you experience any of the following, contact us immediately: New numbness or weakness Pain that is progressively getting worse, and is not relieved by your pain medications or rest Bleeding, redness, swelling, pain, or drainage from surgical incision Chills or flu-like symptoms Fever greater than 101.0 F (38.3 C) Problems with bowel or bladder functions Difficulty breathing or shortness of breath Warmth, tenderness, or swelling in your calf  Contact Information How to contact us:  If you have any questions/concerns before or after surgery, you can reach [place] at [phone], or you can send a mychart message. We can be reached by phone or mychart 8am-4pm, Monday-Friday.  *Please note: Calls after 4pm are forwarded to a third party answering service. Mychart messages are not routinely monitored during evenings, weekends, and holidays. Please call our office to contact the answering service for urgent concerns during non-business hours.      [facility]  They will call you to schedule when they will come out to see you for nursing and Physical Therapy Home health care service [id number]  [phone] Open ? Closes 5?PM

## 2023-07-11 NOTE — Evaluation (Signed)
Physical Therapy Evaluation Patient Details Name: Sara Munoz MRN: [id number] DOB: 01/14/1966 Today's Date: 07/11/2023  History of Present Illness  Pt is a 58 y/o F admitted on 07/10/23 for T8-9, T9-10 transpedicular decompression/discectomy, T8-11 posterior instrumentation, T8-11 posterolateral arthrodesis. PMH: anemia, anxiety, CAD, chronic back pain, COPD, cervical & lumbar DDD, Hep C, HLD, HTN, MI, OA, PTSD, PVD, TIA  Clinical Impression  Pt seen for PT evaluation with pt agreeable to tx. Pt reports prior to admission she was living with her boyfriend in a 1 level home with 5 steps & B rails to enter at back door. On this date, pt c/o significant back pain despite being premedicated. Pt is able to complete transfers with CGA & cuing re: hand placement, ambulate room<>gym with RW & CGA & negotiate stairs with CGA. PT reviewed bed mobility but pt would benefit from practice. Anticipate pt will progress well once pain becomes more managed.        If plan is discharge home, recommend the following: A little help with walking and/or transfers;A little help with bathing/dressing/bathroom;Assistance with cooking/housework;Assist for transportation;Help with stairs or ramp for entrance   Can travel by private vehicle        Equipment Recommendations Rolling walker (2 wheels)  Recommendations for Other Services       Functional Status Assessment Patient has had a recent decline in their functional status and demonstrates the ability to make significant improvements in function in a reasonable and predictable amount of time.     Precautions / Restrictions Precautions Precautions: Fall;Back Restrictions Weight Bearing Restrictions Per Provider Order: No      Mobility  Bed Mobility               General bed mobility comments: pt received sitting in recliner, reviewed bed mobility with PT providing education    Transfers Overall transfer level: Needs assistance Equipment used:  Rolling walker (2 wheels) Transfers: Sit to/from Stand Sit to Stand: Contact guard assist           General transfer comment: STS from recliner & w/c with cuing re: hand placement    Ambulation/Gait Ambulation/Gait assistance: Contact guard assist Gait Distance (Feet):  (> 150 ft x 2) Assistive device: Rolling walker (2 wheels) Gait Pattern/deviations: Decreased stride length, Decreased step length - right, Decreased step length - left Gait velocity: decreased     General Gait Details: narrow step width RLE>LLE, min cuing to ambulate within base of RW as pt begins to push RW out in front of her slightly as she fatigues  Stairs Stairs: Yes Stairs assistance: Contact guard assist Stair Management: Two rails Number of Stairs: 4 (6") General stair comments: PT provides education re: compensatory pattern with pt then negotiating stairs with step over step with CGA.  Wheelchair Mobility     Tilt Bed    Modified Rankin (Stroke Patients Only)       Balance Overall balance assessment: Needs assistance Sitting-balance support: Feet supported Sitting balance-Leahy Scale: Good     Standing balance support: During functional activity, Bilateral upper extremity supported, Reliant on assistive device for balance Standing balance-Leahy Scale: Fair                               Pertinent Vitals/Pain Pain Assessment Pain Assessment: 0-10 Pain Score: 9  Pain Location: back Pain Descriptors / Indicators: Discomfort, Grimacing, Guarding Pain Intervention(s): Monitored during session, Limited activity within patient's tolerance, Repositioned,  Premedicated before session    Home Living Family/patient expects to be discharged to:: Private residence Living Arrangements: Spouse/significant other Available Help at Discharge: Family;Available 24 hours/day Type of Home: House Home Access: Stairs to enter Entrance Stairs-Rails: Doctor, general practice of Steps:  2 through front without rails, 5 steps in back with B rails   Home Layout: One level Home Equipment: None      Prior Function Prior Level of Function : Independent/Modified Independent             Mobility Comments: Independent without AD       Extremity/Trunk Assessment   Upper Extremity Assessment Upper Extremity Assessment: Overall WFL for tasks assessed    Lower Extremity Assessment Lower Extremity Assessment: Overall WFL for tasks assessed;Generalized weakness    Cervical / Trunk Assessment Cervical / Trunk Assessment: Back Surgery  Communication   Communication Communication: No apparent difficulties  Cognition Arousal: Alert Behavior During Therapy: Flat affect Overall Cognitive Status: Within Functional Limits for tasks assessed                                          General Comments      Exercises     Assessment/Plan    PT Assessment Patient needs continued PT services  PT Problem List Decreased strength;Decreased coordination;Pain;Decreased activity tolerance;Decreased balance;Decreased mobility;Decreased knowledge of precautions;Decreased safety awareness;Decreased knowledge of use of DME       PT Treatment Interventions DME instruction;Balance training;Modalities;Gait training;Neuromuscular re-education;Stair training;Functional mobility training;Therapeutic activities;Therapeutic exercise;Manual techniques;Patient/family education    PT Goals (Current goals can be found in the Care Plan section)  Acute Rehab PT Goals Patient Stated Goal: decreased pain PT Goal Formulation: With patient Time For Goal Achievement: 07/25/23 Potential to Achieve Goals: Good    Frequency Min 1X/week     Co-evaluation PT/OT/SLP Co-Evaluation/Treatment: Yes Reason for Co-Treatment: Other (comment) (2/2 pt's c/o pain & unsure if pt can tolerate 2 separate sessions at this time) PT goals addressed during session: Balance;Mobility/safety with  mobility;Proper use of DME         AM-PAC PT "6 Clicks" Mobility  Outcome Measure Help needed turning from your back to your side while in a flat bed without using bedrails?: None Help needed moving from lying on your back to sitting on the side of a flat bed without using bedrails?: A Little Help needed moving to and from a bed to a chair (including a wheelchair)?: A Little Help needed standing up from a chair using your arms (e.g., wheelchair or bedside chair)?: A Little Help needed to walk in hospital room?: A Little Help needed climbing 3-5 steps with a railing? : A Little 6 Click Score: 19    End of Session   Activity Tolerance: Patient tolerated treatment well;Patient limited by pain Patient left:  (left on toilet in care of OT) Nurse Communication: Mobility status PT Visit Diagnosis: Pain;Other abnormalities of gait and mobility (R26.89);Muscle weakness (generalized) (M62.81);Difficulty in walking, not elsewhere classified (R26.2) Pain - part of body:  (back)    Time: 6045-4098 PT Time Calculation (min) (ACUTE ONLY): 18 min   Charges:   PT Evaluation $PT Eval Moderate Complexity: 1 Mod   PT General Charges $$ ACUTE PT VISIT: 1 Visit         Aleda Grana, PT, DPT 07/11/23, 10:46 AM   Sandi Mariscal 07/11/2023, 10:44 AM

## 2023-07-11 NOTE — Plan of Care (Signed)
Patient alert and oriented. Complained of post op pain in the beginning of shift. Patients pain managed with PRN medication. Patient eventually fell asleep. Vital signs remain stable. No acute distress noted. Monitoring all drain output. Will continue to monitor.   Problem: Education: Goal: Ability to verbalize activity precautions or restrictions will improve Outcome: Progressing Goal: Knowledge of the prescribed therapeutic regimen will improve Outcome: Progressing Goal: Understanding of discharge needs will improve Outcome: Progressing   Problem: Activity: Goal: Ability to avoid complications of mobility impairment will improve Outcome: Progressing Goal: Ability to tolerate increased activity will improve Outcome: Progressing Goal: Will remain free from falls Outcome: Progressing   Problem: Bowel/Gastric: Goal: Gastrointestinal status for postoperative course will improve Outcome: Progressing   Problem: Clinical Measurements: Goal: Ability to maintain clinical measurements within normal limits will improve Outcome: Progressing Goal: Postoperative complications will be avoided or minimized Outcome: Progressing Goal: Diagnostic test results will improve Outcome: Progressing   Problem: Pain Management: Goal: Pain level will decrease Outcome: Progressing   Problem: Skin Integrity: Goal: Will show signs of wound healing Outcome: Progressing   Problem: Health Behavior/Discharge Planning: Goal: Identification of resources available to assist in meeting health care needs will improve Outcome: Progressing   Problem: Bladder/Genitourinary: Goal: Urinary functional status for postoperative course will improve Outcome: Progressing   Problem: Education: Goal: Knowledge of General Education information will improve Description: Including pain rating scale, medication(s)/side effects and non-pharmacologic comfort measures Outcome: Progressing   Problem: Health  Behavior/Discharge Planning: Goal: Ability to manage health-related needs will improve Outcome: Progressing   Problem: Clinical Measurements: Goal: Ability to maintain clinical measurements within normal limits will improve Outcome: Progressing Goal: Will remain free from infection Outcome: Progressing Goal: Diagnostic test results will improve Outcome: Progressing Goal: Respiratory complications will improve Outcome: Progressing Goal: Cardiovascular complication will be avoided Outcome: Progressing   Problem: Activity: Goal: Risk for activity intolerance will decrease Outcome: Progressing   Problem: Nutrition: Goal: Adequate nutrition will be maintained Outcome: Progressing   Problem: Coping: Goal: Level of anxiety will decrease Outcome: Progressing   Problem: Elimination: Goal: Will not experience complications related to bowel motility Outcome: Progressing Goal: Will not experience complications related to urinary retention Outcome: Progressing   Problem: Pain Managment: Goal: General experience of comfort will improve and/or be controlled Outcome: Progressing   Problem: Safety: Goal: Ability to remain free from injury will improve Outcome: Progressing   Problem: Skin Integrity: Goal: Risk for impaired skin integrity will decrease Outcome: Progressing

## 2023-07-11 NOTE — Evaluation (Signed)
Occupational Therapy Evaluation Patient Details Name: Sara Munoz MRN: [id number] DOB: [birth year] Today's Date: 07/11/2023   History of Present Illness Pt is a 58 y/o F admitted on 07/10/23 for T8-9, T9-10 transpedicular decompression/discectomy, T8-11 posterior instrumentation, T8-11 posterolateral arthrodesis. PMH: anemia, anxiety, CAD, chronic back pain, COPD, cervical & lumbar DDD, Hep C, HLD, HTN, MI, OA, PTSD, PVD, TIA   Clinical Impression   Patient received for OT evaluation. See flowsheet below for details of function. Generally, patient requiring CGA with RW for functional mobility, and set up for UB ADLs and MOD-MAX A LB ADLs. Would benefit from AE training. Patient will benefit from continued OT while in acute care.       If plan is discharge home, recommend the following: A little help with walking and/or transfers;A lot of help with bathing/dressing/bathroom;Assistance with cooking/housework;Assist for transportation;Help with stairs or ramp for entrance    Functional Status Assessment  Patient has had a recent decline in their functional status and demonstrates the ability to make significant improvements in function in a reasonable and predictable amount of time.  Equipment Recommendations  Tub/shower seat    Recommendations for Other Services       Precautions / Restrictions Precautions Precautions: Fall;Back Restrictions Weight Bearing Restrictions Per Provider Order: No      Mobility Bed Mobility               General bed mobility comments: pt received sitting in recliner, reviewed bed mobility (log roll) with pt able to explain log roll. She states she has had at least 4 other back/neck surgeries.    Transfers Overall transfer level: Needs assistance Equipment used: Rolling walker (2 wheels) Transfers: Sit to/from Stand Sit to Stand: Contact guard assist           General transfer comment: STS from recliner & w/c with cuing re: hand  placement; Walked to practice gym and back to room (slightly over 1 lap around the unit), slow pace at first; faster pace on the way back; no improvement in pain. Use of RW CGA throughout mobility.      Balance Overall balance assessment: Needs assistance Sitting-balance support: Feet supported Sitting balance-Leahy Scale: Good     Standing balance support: During functional activity, Bilateral upper extremity supported, Reliant on assistive device for balance Standing balance-Leahy Scale: Fair                             ADL either performed or assessed with clinical judgement   ADL Overall ADL's : Needs assistance/impaired Eating/Feeding: Set up     Grooming Details (indicate cue type and reason): anticipate set up from standing; may need moditications training for not leaning forward   Upper Body Bathing Details (indicate cue type and reason): anticipate set up (may need assist for back 2/2 incision)   Lower Body Bathing Details (indicate cue type and reason): anticipate MOD A; discussed recommendation for long-handled sponge with patient.   Upper Body Dressing Details (indicate cue type and reason): anticipate (I)   Lower Body Dressing Details (indicate cue type and reason): anticipate MOD A; pt unable to make figure four position while seated today, and due to back precautions should not lean down; education provided on reacher (verbally); would benefit from trying reacher and sock aid Toilet Transfer: Contact guard assist;Regular Toilet;Grab bars Toilet Transfer Details (indicate cue type and reason): use of grab bar on L side.   Toileting - Clothing  Manipulation Details (indicate cue type and reason): pt able to move gown out of her way; anticipate MOD (I). Tried to urinate, but was unable. Has been getting up to [facility] with nursing.   Tub/Shower Transfer Details (indicate cue type and reason): recommend assist and shower chair   General ADL Comments: Slow, lots of  pain; would benefit from AE training and further practice with ADLs; pt states that boyfriend can assist with ADLs at d/c.     Vision Patient Visual Report: No change from baseline (has glasses for reading on windowsill)       Perception         Praxis         Pertinent Vitals/Pain Pain Assessment Pain Assessment: 0-10 Pain Score: 9  Pain Location: back Pain Descriptors / Indicators: Discomfort, Grimacing, Guarding Pain Intervention(s): Limited activity within patient's tolerance, Monitored during session, Repositioned, Premedicated before session     Extremity/Trunk Assessment Upper Extremity Assessment Upper Extremity Assessment: Overall WFL for tasks assessed (Pt notes constant numbness/tingling in BIL hands; multiple neck surgeries in hx.)   Lower Extremity Assessment Lower Extremity Assessment: Overall WFL for tasks assessed   Cervical / Trunk Assessment Cervical / Trunk Assessment: Back Surgery   Communication Communication Communication: No apparent difficulties   Cognition Arousal: Alert Behavior During Therapy: Flat affect Overall Cognitive Status: Within Functional Limits for tasks assessed                                       General Comments  On room air. RN gave medications prior to session.    Exercises     Shoulder Instructions      Home Living Family/patient expects to be discharged to:: Private residence Living Arrangements: Spouse/significant other Available Help at Discharge: Family;Available 24 hours/day Type of Home: House Home Access: Stairs to enter [employer] of Steps: 2 through front without rails, 5 steps in back with B rails Entrance Stairs-Rails: Right;Left Home Layout: One level     Bathroom Shower/Tub: Chief Strategy Officer: Standard     Home Equipment: None          Prior Functioning/Environment Prior Level of Function : Needs assist             Mobility Comments:  Independent without AD; denies falls ADLs Comments: Needs assist for driving; boyfriend (who lives with patient) does housework.        OT Problem List: Decreased activity tolerance;Decreased range of motion;Impaired balance (sitting and/or standing);Decreased knowledge of use of DME or AE;Impaired sensation      OT Treatment/Interventions: Self-care/ADL training;Therapeutic exercise;DME and/or AE instruction;Therapeutic activities;Patient/family education    OT Goals(Current goals can be found in the care plan section) Acute Rehab OT Goals Patient Stated Goal: Pain to improve OT Goal Formulation: With patient Time For Goal Achievement: 07/25/23 Potential to Achieve Goals: Good ADL Goals Pt Will Perform Grooming: with modified independence;standing Pt Will Perform Lower Body Dressing: with modified independence;sit to/from stand;with adaptive equipment Pt Will Perform Toileting - Clothing Manipulation and hygiene: with modified independence;sit to/from stand  OT Frequency: Min 1X/week    Co-evaluation PT/OT/SLP Co-Evaluation/Treatment: Yes Reason for Co-Treatment: Other (comment) (2/2 pt's c/o pain & unsure if pt can tolerate 2 separate sessions at this time) PT goals addressed during session: Balance;Mobility/safety with mobility;Proper use of DME OT goals addressed during session: ADL's and self-care;Proper use of Adaptive equipment and  DME      AM-PAC OT "6 Clicks" Daily Activity     Outcome Measure Help from another person eating meals?: None Help from another person taking care of personal grooming?: A Little Help from another person toileting, which includes using toliet, bedpan, or urinal?: A Little Help from another person bathing (including washing, rinsing, drying)?: A Lot Help from another person to put on and taking off regular upper body clothing?: None Help from another person to put on and taking off regular lower body clothing?: A Lot 6 Click Score: 18   End of  Session Equipment Utilized During Treatment: Rolling walker (2 wheels) Nurse Communication: Mobility status  Activity Tolerance: Patient tolerated treatment well;Patient limited by pain Patient left: in chair;with call bell/phone within reach;with chair alarm set  OT Visit Diagnosis: Pain Pain - part of body:  (back)                Time: 1010-1042 OT Time Calculation (min): 32 min Charges:  OT General Charges $OT Visit: 1 Visit OT Evaluation $OT Eval Moderate Complexity: 1 Mod OT Treatments $Self Care/Home Management : 8-22 mins  Linward Foster, MS, OTR/L  Alvester Morin 07/11/2023, 12:00 PM

## 2023-07-11 NOTE — Progress Notes (Addendum)
   Neurosurgery Progress Note  History: Sara Munoz is s/p T8-9, T9-10 decompression and T8-11 PSF.  POD1: pt complaining of expected back pain overnight.   Physical Exam: Vitals:   07/11/23 0353 07/11/23 0753  BP: 119/65 (!) 104/59  Pulse: 69 85  Resp: 18 18  Temp: 98.1 F (36.7 C) 98.2 F (36.8 C)  SpO2: 97% 98%    AA Ox3 CNI  Strength:5/5 throughout BLE HV output 160 since    Data:  Other tests/results:     Latest Ref Rng & Units 07/11/2023    4:55 AM 07/10/2023    1:12 PM 07/10/2023    8:48 AM  CBC  WBC 4.0 - 10.5 K/uL 14.2  8.6  6.6   Hemoglobin 12.0 - 15.0 g/dL 8.8  9.1  91.4   Hematocrit 36.0 - 46.0 % 28.7  29.6  32.3   Platelets 150 - 400 K/uL 222  211  252       Latest Ref Rng & Units 07/10/2023    1:12 PM 07/10/2023    8:48 AM 04/18/2023    9:47 AM  BMP  Glucose 70 - 99 mg/dL  782  98   BUN 6 - 20 mg/dL  11  15   Creatinine 9.56 - 1.00 mg/dL 2.13  0.86  5.78   Sodium 135 - 145 mmol/L  135  138   Potassium 3.5 - 5.1 mmol/L  4.1  4.2   Chloride 98 - 111 mmol/L  102  104   CO2 22 - 32 mmol/L  23  27   Calcium 8.9 - 10.3 mg/dL  8.3  9.0     Assessment/Plan:  Sara Munoz is a 58 y.o presenting with thoracic myelopathy s/p T8-9, T9-10 decompression and T8-11 PSF.  - mobilize - pain control. Will consider adding Valium when blood pressure improves -Mild hypotension.  Patient received IV fluids overnight.   - continue HV and wound vac for now  - DVT prophylaxis - PTOT  Manning Charity [name] Department of Neurosurgery

## 2023-07-12 NOTE — Progress Notes (Signed)
Neurosurgery Progress Note   History: Sara Munoz is s/p T8-9, T9-10 decompression and T8-11 PSF.   POD1: pt complaining of expected back pain overnight.    Physical Exam:     Vitals:    07/11/23 0353 07/11/23 0753  BP: 119/65 (!) 104/59  Pulse: 69 85  Resp: 18 18  Temp: 98.1 F (36.7 C) 98.2 F (36.8 C)  SpO2: 97% 98%      AA Ox3 CNI   Strength:5/5 throughout BLE HV output 100 since     Data:   Other tests/results:      Latest Ref Rng & Units 07/11/2023    4:55 AM 07/10/2023    1:12 PM 07/10/2023    8:48 AM  CBC  WBC 4.0 - 10.5 K/uL 14.2  8.6  6.6   Hemoglobin 12.0 - 15.0 g/dL 8.8  9.1  09.8   Hematocrit 36.0 - 46.0 % 28.7  29.6  32.3   Platelets 150 - 400 K/uL 222  211  252         Latest Ref Rng & Units 07/10/2023    1:12 PM 07/10/2023    8:48 AM 04/18/2023    9:47 AM  BMP  Glucose 70 - 99 mg/dL   119  98   BUN 6 - 20 mg/dL   11  15   Creatinine 1.47 - 1.00 mg/dL 8.29  5.62  1.30   Sodium 135 - 145 mmol/L   135  138   Potassium 3.5 - 5.1 mmol/L   4.1  4.2   Chloride 98 - 111 mmol/L   102  104   CO2 22 - 32 mmol/L   23  27   Calcium 8.9 - 10.3 mg/dL   8.3  9.0     Assessment/Plan:   Sara Munoz is a 58 y.o presenting with thoracic myelopathy s/p T8-9, T9-10 decompression and T8-11 PSF.   - mobilize - pain control. Will consider adding Valium when blood pressure improves -Mild hypotension.  Patient received IV fluids overnight.   - continue HV and wound vac for now  - DVT prophylaxis - PTOT   Karenann Cai MD, Phd Department of Neurosurgery

## 2023-07-12 NOTE — Plan of Care (Signed)
  Problem: Education: Goal: Ability to verbalize activity precautions or restrictions will improve Outcome: Progressing Goal: Knowledge of the prescribed therapeutic regimen will improve Outcome: Progressing Goal: Understanding of discharge needs will improve Outcome: Progressing   Problem: Activity: Goal: Ability to avoid complications of mobility impairment will improve Outcome: Progressing

## 2023-07-12 NOTE — Progress Notes (Signed)
Mobility Specialist - Progress Note     07/12/23 1100  Mobility  Activity Ambulated with assistance in hallway;Stood at bedside;Dangled on edge of bed  Level of Assistance Modified independent, requires aide device or extra time  Assistive Device Front wheel walker  Distance Ambulated (ft) 320 ft  Range of Motion/Exercises Active  Activity Response Tolerated well  Mobility Referral Yes  Mobility visit 1 Mobility  Mobility Specialist Start Time (ACUTE ONLY) 1048  Mobility Specialist Stop Time (ACUTE ONLY) 1105  Mobility Specialist Time Calculation (min) (ACUTE ONLY) 17 min   Pt resting in bed on RA upon entry. Pt endorses pain but, agreeable to participate in ambulation. Pt performed bed mobility ModI and STS to RW ModI. Pt ambulates around NS ModI for x2 laps Pt returned to bed and left with needs in reach bed alarm activated.   Johnathan Hausen Mobility Specialist 07/12/23, 11:38 AM

## 2023-07-12 NOTE — TOC Initial Note (Addendum)
Transition of Care [facility]) - Initial/Assessment Note    Patient Details  Name: Sara Munoz MRN: [id number] Date of Birth: [birth year]  Transition of Care [facility]) CM/SW Contact:    Liliana Cline, LCSW Phone Number: 07/12/2023, 12:52 PM  Clinical Narrative:                 CSW met with patient at bedside.  Patient is from home with her boyfriend. PCP is Dr. Neita Garnet. [facility]. Patient uses transportation through her insurance. No HH history. Patient has a wheelchair at home. Patient is agreeable to therapy recs for HH, RW, and shower seat.  No agency preferences. Referral made to [facility] with [facility]. TOC handoff updated to order RW and shower seat closer to DC as per [employer]'s policy. Per chart review, patient has a wound vac - CSW asked RN and MD if patient needs a home vac ordered - per Dr. Emogene Morgan, patient already has a wound vac for home use.  Expected Discharge Plan: Home w Home Health Services Barriers to Discharge: Continued Medical Work up   Patient Goals and CMS Choice Patient states their goals for this hospitalization and ongoing recovery are:: home with home health CMS Medicare.gov Compare Post Acute Care list provided to:: Patient Choice offered to / list presented to : Patient      Expected Discharge Plan and Services       Living arrangements for the past 2 months: Single Family Home                           HH Arranged: PT, OT, RN [facility] Agency: [facility] Date [facility] Agency Contacted: 07/12/23   Representative spoke with at [facility] Agency: Elnita Maxwell  Prior Living Arrangements/Services Living arrangements for the past 2 months: Single Family Home Lives with:: Significant Other Patient language and need for interpreter reviewed:: Yes Do you feel safe going back to the place where you live?: Yes      Need for Family Participation in Patient Care: Yes (Comment) Care giver support system in place?: Yes (comment) Current home  services: DME Criminal Activity/Legal Involvement Pertinent to Current Situation/Hospitalization: No - Comment as needed  Activities of Daily Living      Permission Sought/Granted Permission sought to share information with : Facility Industrial/product designer granted to share information with : Yes, Verbal Permission Granted     Permission granted to share info w AGENCY: Home Health and DME agencies        Emotional Assessment       Orientation: : Oriented to Self, Oriented to Place, Oriented to  Time, Oriented to Situation Alcohol / Substance Use: Not Applicable Psych Involvement: No (comment)  Admission diagnosis:  Thoracic disc disease with myelopathy [M51.04] Patient Active Problem List   Diagnosis Date Noted   Thoracic disc disease with myelopathy 07/10/2023   Other intervertebral disc degeneration, thoracic region 06/05/2023   Primary insomnia 06/03/2023   Myelopathy concurrent with and due to spinal stenosis of thoracic region (HCC) 05/01/2023   Iron deficiency anemia 04/21/2023   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   History of lumbar fusion 03/18/2023   S/P cervical spinal fusion 03/18/2023   Tendinitis of upper biceps tendon of right shoulder 01/17/2023   Rotator cuff tendinitis, right 01/17/2023   Chronic fatigue 01/06/2023   Prediabetes 01/06/2023   Status post left rotator cuff repair 12/04/2022   Foreign body left in shoulder 12/04/2022  Atopic dermatitis of both hands 09/12/2022   Headache disorder 08/13/2022   Nontraumatic incomplete tear of left rotator cuff 05/28/2022   Spondylolisthesis 04/19/2022   Encounter for annual physical examination excluding gynecological examination in a patient older than 17 years 04/03/2022   Essential hypertension 03/11/2022   Lumbar spondylosis 02/12/2022   Chronic migraine w/o aura w/o status migrainosus, not intractable 07/09/2021   Obstructive sleep apnea 07/09/2021   Body mass index (BMI) 32.0-32.9,  adult 06/07/2021   Pseudoarthrosis of cervical spine (HCC) 05/14/2021   Lumbar radiculopathy 05/23/2020   Sacroiliac inflammation (HCC) 11/25/2019   Sepsis (HCC) 10/19/2018   Severe sepsis (HCC) 10/19/2018   Cervical radiculopathy 02/12/2018   Chronic migraine 02/12/2018   Paresthesia 08/29/2017   Neck pain 08/29/2017   Daily headache 08/29/2017   Chronic active hepatitis (HCC) 07/29/2017   Neurogenic pain 07/31/2016   Chronic pain syndrome 06/05/2016   Carotid artery stenosis 02/27/2016   Chronic constipation 02/27/2016   Chronic nausea 02/27/2016   Hypercholesterolemia 02/27/2016   Osteoarthritis 02/27/2016   PTSD (post-traumatic stress disorder) 02/27/2016   TIA (transient ischemic attack) 02/27/2016   Long term current use of opiate analgesic 02/27/2016   Long term prescription opiate use 02/27/2016   Encounter for pain management consult 02/27/2016   Chronic hip pain (Location of Tertiary source of pain) (Bilateral) (L>R) 02/27/2016   Chronic knee pain (Bilateral) (L>R) 02/27/2016   Chronic shoulder pain (Bilateral) (L>R) 02/27/2016   Chronic sacroiliac joint pain (Bilateral) (L>R) 02/27/2016   Chronic low back pain (Location of Primary Source of Pain) (Bilateral) (L>R) 02/27/2016   Chronic lower extremity pain (Location of Secondary source of pain) (Bilateral) (L>R) 02/27/2016   Osteoarthritis of hip (Bilateral) (L>R) 02/27/2016   Chronic neck pain (posterior midline) (Bilateral) (L>R) 02/27/2016   Cervicogenic headache (Bilateral) (L>R) 02/27/2016   Occipital headache (Bilateral) (L>R) 02/27/2016   Chronic upper extremity pain (Bilateral) (L>R) 02/27/2016   Chronic Cervical radicular pain (Bilateral) (L>R) 02/27/2016   Chronic lumbar radicular pain (Right) (L5 dermatome) 02/27/2016   Lumbar facet syndrome (Bilateral) (L>R) 02/27/2016   Long term prescription benzodiazepine use 02/27/2016   Hypertension    Chronic obstructive pulmonary disease (HCC) 10/09/2015   GERD  (gastroesophageal reflux disease) 10/09/2015   Non-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/2014 08/02/2015   Hyperlipidemia 08/02/2015   Costochondritis 08/02/2015   Drug abuse, 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   GAD (generalized anxiety disorder) 09/03/2014   Narcotic dependence (HCC) 09/03/2014   PVD (peripheral vascular disease) (HCC) 09/03/2014   Smoker 09/03/2014   Cigarette nicotine dependence with nicotine-induced disorder 07/08/2014   Anemia of chronic disease 03/19/2014   Polysubstance abuse (HCC) 03/19/2014   MSSA (methicillin susceptible Staphylococcus aureus) septicemia (HCC) 03/07/2014   Hypomagnesemia 01/04/2014   Chronic cough 09/04/2011   Sleep apnea, obstructive 09/04/2011   Sinusitis, acute 09/04/2011   PCP:  Ronnald Ramp, MD [facility]   [phone] [place] [place] - [address] [place] [address] Phone: [phone] Fax: [phone]  [facility] Delivery - [place], [place] - [address] [place] [address] Phone: [phone] Fax: [phone]     Social Drivers of Health (SDOH) Social History: SDOH Screenings   Food Insecurity: Patient Declined (07/10/2023)  Housing: Patient Declined (07/10/2023)  Transportation Needs: Patient Declined (07/10/2023)  Utilities: Patient Declined (07/10/2023)  Alcohol Screen: Low Risk  (06/16/2023)  Depression (PHQ2-9): Medium Risk (06/03/2023)  Financial Resource Strain: Low Risk  (01/01/2023)  Physical  Activity: Inactive (04/15/2023)  Social Connections: Socially Isolated (04/15/2023)  Stress: Stress Concern Present (03/17/2023)  Tobacco Use: High Risk (07/10/2023)  Health Literacy: Adequate Health Literacy (02/03/2023)   SDOH Interventions:     Readmission Risk Interventions     No data to display

## 2023-07-13 NOTE — Plan of Care (Signed)
Patient alert and oriented x4. No acute distress noted. Medicated for pain throughout shift with short term relief. Drains remain in place with output. Patient had a sizeable bowel movement tonight. Patient chose to sleep in recliner because it was more comfortable for her. Will continue to monitor.   Problem: Education: Goal: Ability to verbalize activity precautions or restrictions will improve Outcome: Not Progressing   Problem: Activity: Goal: Ability to avoid complications of mobility impairment will improve Outcome: Not Progressing   Problem: Activity: Goal: Ability to tolerate increased activity will improve Outcome: Not Progressing   Problem: Activity: Goal: Will remain free from falls Outcome: Not Progressing   Problem: Clinical Measurements: Goal: Ability to maintain clinical measurements within normal limits will improve Outcome: Not Progressing   Problem: Pain Management: Goal: Pain level will decrease Outcome: Not Progressing

## 2023-07-13 NOTE — Progress Notes (Signed)
Neurosurgery Progress Note   History: Sara Munoz is s/p T8-9, T9-10 decompression and T8-11 PSF.  POD2: Doing better, but still considerable back pain.  POD1: pt complaining of expected back pain overnight.    Physical Exam:        Vitals:    07/11/23 0353 07/11/23 0753  BP: 119/65 (!) 104/59  Pulse: 69 85  Resp: 18 18  Temp: 98.1 F (36.7 C) 98.2 F (36.8 C)  SpO2: 97% 98%      AA Ox3 CNI   Strength:5/5 throughout BLE HV output 100 since     Data:   Other tests/results:      Latest Ref Rng & Units 07/11/2023    4:55 AM 07/10/2023    1:12 PM 07/10/2023    8:48 AM  CBC  WBC 4.0 - 10.5 K/uL 14.2  8.6  6.6   Hemoglobin 12.0 - 15.0 g/dL 8.8  9.1  40.9   Hematocrit 36.0 - 46.0 % 28.7  29.6  32.3   Platelets 150 - 400 K/uL 222  211  252         Latest Ref Rng & Units 07/10/2023    1:12 PM 07/10/2023    8:48 AM 04/18/2023    9:47 AM  BMP  Glucose 70 - 99 mg/dL   811  98   BUN 6 - 20 mg/dL   11  15   Creatinine 9.14 - 1.00 mg/dL 7.82  9.56  2.13   Sodium 135 - 145 mmol/L   135  138   Potassium 3.5 - 5.1 mmol/L   4.1  4.2   Chloride 98 - 111 mmol/L   102  104   CO2 22 - 32 mmol/L   23  27   Calcium 8.9 - 10.3 mg/dL   8.3  9.0     Assessment/Plan:   Sara Munoz is a 58 y.o presenting with thoracic myelopathy s/p T8-9, T9-10 decompression and T8-11 PSF.   - mobilize - pain control. Will consider adding Valium when blood pressure improves -Mild hypotension.  Patient received IV fluids overnight.   - d/c HV and wound vac.  - DVT prophylaxis - PTOT   Karenann Cai MD, Phd Department of Neurosurgery

## 2023-07-14 ENCOUNTER — Other Ambulatory Visit: Payer: Self-pay

## 2023-07-14 MED ORDER — SENNA 8.6 MG PO TABS
1.0000 | ORAL_TABLET | Freq: Two times a day (BID) | ORAL | 0 refills | Status: DC | PRN
  Filled 2023-07-14: qty 60, 30d supply, fill #0

## 2023-07-14 MED ORDER — HYDROMORPHONE HCL 2 MG PO TABS
2.0000 mg | ORAL_TABLET | ORAL | 0 refills | Status: DC | PRN
  Filled 2023-07-14: qty 45, 8d supply, fill #0

## 2023-07-14 MED ORDER — METHOCARBAMOL 750 MG PO TABS
750.0000 mg | ORAL_TABLET | Freq: Four times a day (QID) | ORAL | 0 refills | Status: DC
  Filled 2023-07-14: qty 120, 30d supply, fill #0

## 2023-07-14 NOTE — TOC Progression Note (Signed)
Transition of Care [facility]) - Progression Note    Patient Details  Name: Sara Munoz MRN: [id number] Date of Birth: [birth year]  Transition of Care [facility]) CM/SW Contact  Marlowe Sax, RN Phone Number: 07/14/2023, 10:12 AM  Clinical Narrative:     [employer] notified me that the patient has a hold on their account from a previous unpaid bill, they can not deliver DME until that has been addressed. I notified the patient and she stated that she will handle it later, she chooses to go home without the DME  Expected Discharge Plan: Home w Home Health Services Barriers to Discharge: Continued Medical Work up  Expected Discharge Plan and Services       Living arrangements for the past 2 months: Single Family Home Expected Discharge Date: 07/14/23                         HH Arranged: PT, OT, RN HH Agency: [facility] Date [facility] Agency Contacted: 07/12/23   Representative spoke with at [facility] Agency: Elnita Maxwell   Social Determinants of Health (SDOH) Interventions SDOH Screenings   Food Insecurity: Patient Declined (07/10/2023)  Housing: Patient Declined (07/10/2023)  Transportation Needs: Patient Declined (07/10/2023)  Utilities: Patient Declined (07/10/2023)  Alcohol Screen: Low Risk  (06/16/2023)  Depression (PHQ2-9): Medium Risk (06/03/2023)  Financial Resource Strain: Low Risk  (01/01/2023)  Physical Activity: Inactive (04/15/2023)  Social Connections: Socially Isolated (04/15/2023)  Stress: Stress Concern Present (03/17/2023)  Tobacco Use: High Risk (07/10/2023)  Health Literacy: Adequate Health Literacy (02/03/2023)    Readmission Risk Interventions     No data to display

## 2023-07-14 NOTE — Care Management Important Message (Signed)
Important Message  Patient Details  Name: Sara Munoz MRN: [id number] Date of Birth: 1965-11-27   Important Message Given:  Yes - Medicare IM     Derionna Salvador W, CMA 07/14/2023, 10:26 AM

## 2023-07-14 NOTE — Discharge Summary (Signed)
Discharge Summary  Patient ID: Sara Munoz MRN: [id number] DOB/AGE: [birth year] 58 y.o.  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 T9-10 disc herniations  Discharge Diagnoses:  Principal Problem:   Myelopathy concurrent with and due to spinal stenosis of thoracic region [facility]) Active Problems:   Chronic obstructive pulmonary disease (HCC)   Chronic cough   Chronic lower extremity pain (Location of Secondary source of pain) (Bilateral) (L>R)   Chronic lumbar radicular pain (Right) (L5 dermatome)   Lumbar facet syndrome (Bilateral) (L>R)   Chronic pain syndrome   Other intervertebral disc degeneration, thoracic region   Thoracic disc disease with myelopathy   Discharged Condition: good  Hospital Course:  Sara Munoz is a 58 y.o presenting with thoracic myeloradiculopathy s/p T8-10 transpedicular decompression and T8-11 PSF.  Her intraoperative course was mildly complicated by some postoperative hypotension which improved with fluids and pain which improved with bandage her medication regimen.  She is seen evaluated by physical therapy and deemed appropriate for discharge home on postop day 4 with home health services.  She was given prescriptions to take for Dilaudid, Robaxin, and senna as needed.  Consults: None  Significant Diagnostic Studies: see results review  Treatments: surgery: as above. Please see separately dictated operative report for further details   Discharge Exam: Blood pressure (!) 106/54, pulse 72, temperature 98 F (36.7 C), temperature source Oral, resp. rate 18, height 5\' 4"  (1.626 m), weight 85.5 kg, last menstrual period 12/16/2017, SpO2 94%. AA Ox3 CNI   Strength:5/5 throughout BLE Incision c/d/I with dermabond in place  Disposition: Discharge disposition: 01-Home or Self Care       Discharge Instructions     Incentive spirometry RT   Complete by: As directed     No wound care   Complete by: As directed       Allergies as of 07/14/2023       Reactions   Cyclobenzaprine Hives, Swelling, Rash   Facial/lip swelling      Levofloxacin Hives, Swelling, Rash   Chlorpheniramine Maleate    Other reaction(s): Unknown   Phenergan [promethazine Hcl] Other (See Comments)   Agitation.    Phenylephrine Hcl Other (See Comments)   unknown   Toradol [ketorolac Tromethamine] Swelling, Other (See Comments)   Facial/tongue swelling    Zoloft [sertraline Hcl] Swelling   Tongue swelling      Cephalexin Hives, Rash   Ketorolac Rash   Tramadol Hives, Swelling, Other (See Comments), Rash   Lip swelling         Medication List     STOP taking these medications    diclofenac 50 MG tablet Commonly known as: CATAFLAM   oxyCODONE 15 MG immediate release tablet Commonly known as: ROXICODONE       TAKE these medications    amLODipine 5 MG tablet Commonly known as: NORVASC TAKE 1 TABLET BY MOUTH EVERYDAY AT BEDTIME   aspirin EC 81 MG tablet Take 81 mg by mouth in the morning. Swallow whole.   atorvastatin 40 MG tablet Commonly known as: LIPITOR Take 1 tablet (40 mg total) by mouth every evening.   docusate sodium 100 MG capsule Commonly known as: COLACE Take 100 mg by mouth in the morning.   gabapentin 600 MG tablet Commonly known as: NEURONTIN Take 600 mg by mouth 4 (four) times daily.   HYDROmorphone 2 MG tablet Commonly known as: DILAUDID Take 1-2 tablets (2-4 mg total) by mouth every 4 (four) hours  as needed for moderate pain (pain score 4-6) or severe pain (pain score 7-10).   Incruse Ellipta 62.5 MCG/ACT Aepb Generic drug: umeclidinium bromide Inhale 1 puff into the lungs in the morning.   Linzess 290 MCG Caps capsule Generic drug: linaclotide Take 290 mcg by mouth daily as needed (constipation.).   lisinopril 40 MG tablet Commonly known as: ZESTRIL Take 40 mg by mouth every evening.   methocarbamol 750 MG tablet Commonly  known as: ROBAXIN Take 1 tablet (750 mg total) by mouth every 6 (six) hours.   metoprolol tartrate 25 MG tablet Commonly known as: LOPRESSOR TAKE 1 TABLET BY MOUTH TWICE A DAY   mometasone 0.1 % cream Commonly known as: ELOCON Apply 1 Application topically daily as needed (hand rash).   Narcan 4 MG/0.1ML Liqd nasal spray kit Generic drug: naloxone Place 1 spray into the nose as needed (opioid overdose).   nitroGLYCERIN 0.4 MG SL tablet Commonly known as: NITROSTAT Place 0.4 mg under the tongue every 5 (five) minutes as needed for chest pain.   omeprazole 40 MG capsule Commonly known as: PRILOSEC Take 1 capsule (40 mg total) by mouth 2 (two) times daily.   ondansetron 8 MG tablet Commonly known as: ZOFRAN Take 1 tablet (8 mg total) by mouth every 8 (eight) hours as needed for nausea or vomiting.   senna 8.6 MG Tabs tablet Commonly known as: SENOKOT Take 1 tablet (8.6 mg total) by mouth 2 (two) times daily as needed for mild constipation.   Spiriva Respimat 2.5 MCG/ACT Aers Generic drug: Tiotropium Bromide Monohydrate Take 1 puff by mouth in the morning and at bedtime. What changed: when to take this   sucralfate 1 GM/10ML suspension Commonly known as: CARAFATE TAKE 10 MLS (1 G TOTAL) BY MOUTH 4 (FOUR) TIMES DAILY.   Ventolin HFA 108 (90 Base) MCG/ACT inhaler Generic drug: albuterol USE 1 TO 2 INHALATIONS BY MOUTH  INTO THE LUNGS EVERY 6 HOURS AS  NEEDED FOR SHORTNESS OF BREATH  OR WHEEZING   zolpidem 5 MG tablet Commonly known as: AMBIEN Take 1 tablet (5 mg total) by mouth at bedtime as needed for sleep.               Durable Medical Equipment  (From admission, onward)           Start     Ordered   07/12/23 1258  For home use only DME Walker rolling  Once       Question Answer Comment  Walker: With 5 Inch Wheels   Patient needs a walker to treat with the following condition Lumbar radiculopathy      07/12/23 1257            Follow-up  Information     Joan Flores, [name] Follow up on 07/23/2023.   Specialty: Physician Assistant Contact information: [address] [place], [place] [address] [place] [address] [phone]                 Signed: Susanne Borders 07/14/2023, 7:58 AM

## 2023-07-14 NOTE — Progress Notes (Signed)
Patient is not able to walk the distance required to go the bathroom, or he/she is unable to safely negotiate stairs required to access the bathroom.  A 3in1 BSC will alleviate this problem  

## 2023-07-14 NOTE — Plan of Care (Signed)
  Problem: Education: Goal: Ability to verbalize activity precautions or restrictions will improve Outcome: Progressing Goal: Knowledge of the prescribed therapeutic regimen will improve Outcome: Progressing Goal: Understanding of discharge needs will improve Outcome: Progressing   Problem: Activity: Goal: Ability to avoid complications of mobility impairment will improve Outcome: Progressing

## 2023-07-14 NOTE — Plan of Care (Signed)
  Problem: Education: Goal: Ability to verbalize activity precautions or restrictions will improve Outcome: Adequate for Discharge Goal: Knowledge of the prescribed therapeutic regimen will improve Outcome: Adequate for Discharge Goal: Understanding of discharge needs will improve Outcome: Adequate for Discharge   Problem: Activity: Goal: Ability to avoid complications of mobility impairment will improve Outcome: Adequate for Discharge Goal: Ability to tolerate increased activity will improve Outcome: Adequate for Discharge Goal: Will remain free from falls Outcome: Adequate for Discharge   Problem: Bowel/Gastric: Goal: Gastrointestinal status for postoperative course will improve Outcome: Adequate for Discharge   Problem: Clinical Measurements: Goal: Ability to maintain clinical measurements within normal limits will improve Outcome: Adequate for Discharge Goal: Postoperative complications will be avoided or minimized Outcome: Adequate for Discharge Goal: Diagnostic test results will improve Outcome: Adequate for Discharge   Problem: Pain Management: Goal: Pain level will decrease Outcome: Adequate for Discharge   Problem: Skin Integrity: Goal: Will show signs of wound healing Outcome: Adequate for Discharge   Problem: Health Behavior/Discharge Planning: Goal: Identification of resources available to assist in meeting health care needs will improve Outcome: Adequate for Discharge   Problem: Bladder/Genitourinary: Goal: Urinary functional status for postoperative course will improve Outcome: Adequate for Discharge   Problem: Education: Goal: Knowledge of General Education information will improve Description: Including pain rating scale, medication(s)/side effects and non-pharmacologic comfort measures Outcome: Adequate for Discharge   Problem: Health Behavior/Discharge Planning: Goal: Ability to manage health-related needs will improve Outcome: Adequate for  Discharge   Problem: Clinical Measurements: Goal: Ability to maintain clinical measurements within normal limits will improve Outcome: Adequate for Discharge Goal: Will remain free from infection Outcome: Adequate for Discharge Goal: Diagnostic test results will improve Outcome: Adequate for Discharge Goal: Respiratory complications will improve Outcome: Adequate for Discharge Goal: Cardiovascular complication will be avoided Outcome: Adequate for Discharge   Problem: Activity: Goal: Risk for activity intolerance will decrease Outcome: Adequate for Discharge   Problem: Nutrition: Goal: Adequate nutrition will be maintained Outcome: Adequate for Discharge   Problem: Coping: Goal: Level of anxiety will decrease Outcome: Adequate for Discharge   Problem: Elimination: Goal: Will not experience complications related to bowel motility Outcome: Adequate for Discharge Goal: Will not experience complications related to urinary retention Outcome: Adequate for Discharge   Problem: Pain Managment: Goal: General experience of comfort will improve and/or be controlled Outcome: Adequate for Discharge   Problem: Safety: Goal: Ability to remain free from injury will improve Outcome: Adequate for Discharge   Problem: Skin Integrity: Goal: Risk for impaired skin integrity will decrease Outcome: Adequate for Discharge

## 2023-07-14 NOTE — Progress Notes (Signed)
Neurosurgery Progress Note   History: Sara Munoz is s/p T8-9, T9-10 decompression and T8-11 PSF.  POD4: continues to have expected pain but overall doing well POD2: Doing better, but still considerable back pain.  POD1: pt complaining of expected back pain overnight.    Physical Exam:        Vitals:    07/11/23 0353 07/11/23 0753  BP: 119/65 (!) 104/59  Pulse: 69 85  Resp: 18 18  Temp: 98.1 F (36.7 C) 98.2 F (36.8 C)  SpO2: 97% 98%      AA Ox3 CNI   Strength:5/5 throughout BLE Incision c/d/I with dermabond in place   Data:   Other tests/results:      Latest Ref Rng & Units 07/11/2023    4:55 AM 07/10/2023    1:12 PM 07/10/2023    8:48 AM  CBC  WBC 4.0 - 10.5 K/uL 14.2  8.6  6.6   Hemoglobin 12.0 - 15.0 g/dL 8.8  9.1  42.5   Hematocrit 36.0 - 46.0 % 28.7  29.6  32.3   Platelets 150 - 400 K/uL 222  211  252         Latest Ref Rng & Units 07/10/2023    1:12 PM 07/10/2023    8:48 AM 04/18/2023    9:47 AM  BMP  Glucose 70 - 99 mg/dL   956  98   BUN 6 - 20 mg/dL   11  15   Creatinine 3.87 - 1.00 mg/dL 5.64  3.32  9.51   Sodium 135 - 145 mmol/L   135  138   Potassium 3.5 - 5.1 mmol/L   4.1  4.2   Chloride 98 - 111 mmol/L   102  104   CO2 22 - 32 mmol/L   23  27   Calcium 8.9 - 10.3 mg/dL   8.3  9.0     Assessment/Plan:   Sara Munoz is a 58 y.o presenting with thoracic myelopathy s/p T8-9, T9-10 decompression and T8-11 PSF.   - mobilize - pain control.  - incision open to air - DVT prophylaxis - PTOT; d/c home with HH   Manning Charity [name] Department of Neurosurgery

## 2023-07-17 ENCOUNTER — Other Ambulatory Visit: Payer: Self-pay | Admitting: Obstetrics and Gynecology

## 2023-07-17 ENCOUNTER — Telehealth: Payer: Self-pay | Admitting: Neurosurgery

## 2023-07-17 NOTE — Patient Instructions (Signed)
Visit Information  Ms. Besselman was given information about Medicaid Managed Care team care coordination services as a part of their [place] [employer] benefit. [name] verbally consented to engagement with the [facility] Managed Care team.   If you are experiencing a medical emergency, please call 911 or report to your local emergency department or urgent care.   If you have a non-emergency medical problem during routine business hours, please contact your provider's office and ask to speak with a nurse.   For questions related to your [place] health plan, please call: [phone]  If you would like to schedule transportation through your [place] plan, please call the following number at least 2 days in advance of your appointment: [phone].   There is no limit to the number of trips during the year between medical appointments, healthcare facilities, or pharmacies. Transportation must be scheduled at least 2 business days before but not more than thirty 30 days before of your appointment.  Call the Behavioral Health Crisis Line at [phone], at any time, 24 hours a day, 7 days a week. If you are in danger or need immediate medical attention call 911.  If you would like help to quit smoking, call 1-800-QUIT-NOW (937-735-1629) OR Espaol: 1-855-Djelo-Ya (1-660-630-1601) o para ms informacin haga clic aqu or Text READY to 093-235 to register via text  Sara Munoz - following are the goals we discussed in your visit today:  Timeframe:  Long-Range Goal Priority:  High Start Date:       12/20/20                      Expected End Date:   ongoing     Follow up date:  08/15/23  - schedule appointment for flu shot - schedule appointment for vaccines needed due to my age or health - schedule recommended health tests (blood work, mammogram, colonoscopy, pap test) - schedule and keep appointment for annual check-up   Why is this  important?   Screening tests can find diseases early when they are easier to treat.  Your doctor or nurse will talk with you about which tests are important for you.  Getting shots for common diseases like the flu and shingles will help prevent them.   07/17/23:  Post op appt next week, PCP in Feb.  Patient verbalizes understanding of instructions and care plan provided today and agrees to view in MyChart. Active MyChart status and patient understanding of how to access instructions and care plan via MyChart confirmed with patient.     The Managed Medicaid care management team will reach out to the patient again over the next 30 business  days.  The  Patient has been provided with contact information for the Managed Medicaid care management team and has been advised to call with any health related questions or concerns.   Kathi Der RN, BSN, [employer] [facility] RN Care Manager Direct Dial [phone]/HCW [phone] Website: [name]   Following is a copy of your plan of care:  Care Plan : RN Care Manager Plan of Care  Updates made by [name], RN since 07/17/2023 12:00 AM     Problem: Health Promotion or Disease Self-Management (General Plan of Care)      Long-Range Goal: Chronic Disease Management   Start Date: 09/12/2022  Expected End Date: 09/14/2023  Priority: High  Note:   Current Barriers:  Knowledge Deficits related to plan of care for  management of GERD, CAD, HTN, PVD, TIA, migranes, COPD, OSA, tobacco use, osteoarthritis, chronic pain, anxiety  Chronic Disease Management support and education needs related to  GERD, CAD, HTN, PVD, TIA, migranes, COPD, OSA, tobacco use, osteoarthritis, chronic pain, anxiety  07/17/23:  Patient recovering from laminectomy-hospitalized 1-16 to 1-20.  Has post op appt next week.  Pain managed with pain meds.  Has decreased smoking to less than half a pack a day-plans on asking PCP for patch.  BP stable.   Receiving HH services-PT/OT twice a week.  Using wheelchair as needed.    RNCM Clinical Goal(s):  Patient will verbalize understanding of plan for management of GERD, CAD, HTN, PVD, TIA, migranes, COPD, OSA, tobacco use, osteoarthritis, chronic pain, anxiety   as evidenced by patient report verbalize basic understanding of  GERD, CAD, HTN, PVD, TIA, migranes, COPD, OSA, tobacco use, osteoarthritis, chronic pain, anxiety  disease process and self health management plan as evidenced by patient report take all medications exactly as prescribed and will call provider for medication related questions as evidenced by patient report  demonstrate understanding of rationale for each prescribed medication as evidenced by patient report attend all scheduled medical appointments as evidenced by patient report and EMR review demonstrate Ongoing adherence to prescribed treatment plan for  GERD, CAD, HTN, PVD, TIA, migranes, COPD, OSA, tobacco use, osteoarthritis, chronic pain, anxiety as evidenced by patient report continue to work with RN Care Manager to address care management and care coordination needs related to GERD, CAD, HTN, PVD, TIA, migranes, COPD, OSA, tobacco use, osteoarthritis, chronic pain, anxiety  as evidenced by adherence to CM Team Scheduled appointments through collaboration with RN Care manager, provider, and care team.   Interventions: Inter-disciplinary care team collaboration (see longitudinal plan of care) Evaluation of current treatment plan related to  self management and patient's adherence to plan as established by provider 01/03/23-discussed protein drink supplementation, MVI  CAD Interventions: (Status:  New goal.) Long Term Goal Assessed understanding of CAD diagnosis Medications reviewed including medications utilized in CAD treatment plan Counseled on importance of regular laboratory monitoring as prescribed Reviewed Importance of taking all medications as prescribed Reviewed  Importance of attending all scheduled provider appointments Advised to report any changes in symptoms or exercise tolerance Screening for signs and symptoms of depression related to chronic disease state Assessed social determinant of health barriers  COPD Interventions:  (Status:  New goal.) Long Term Goal Advised patient to self assesses COPD action plan zone and make appointment with provider if in the yellow zone for 48 hours without improvement Advised patient to engage in light exercise as tolerated 3-5 days a week to aid in the the management of COPD Provided education about and advised patient to utilize infection prevention strategies to reduce risk of respiratory infection Discussed the importance of adequate rest and management of fatigue with COPD Assessed social determinant of health barriers  Hypertension Interventions:  (Status:  New goal.) Long Term Goal Last practice recorded BP readings:  BP Readings from Last 3 Encounters:        05/16/23 117-140/94  06/16/23         135/93 07/17/23         128/65  Most recent eGFR/CrCl:  Lab Results  Component Value Date   EGFR 83 04/08/2022    No components found for: "CRCL"  Evaluation of current treatment plan related to hypertension self management and patient's adherence to plan as established by provider Reviewed medications with patient and discussed importance  of compliance Discussed plans with patient for ongoing care management follow up and provided patient with direct contact information for care management team Advised patient, providing education and rationale, to monitor blood pressure daily and record, calling PCP for findings outside established parameters Reviewed scheduled/upcoming provider appointments including:  Discussed complications of poorly controlled blood pressure such as heart disease, stroke, circulatory complications, vision complications, kidney impairment, sexual dysfunction Assessed social  determinant of health barriers  Pain Interventions:  (Status:  New goal.) Long Term Goal Pain assessment performed Medications reviewed Reviewed provider established plan for pain management Discussed importance of adherence to all scheduled medical appointments Counseled on the importance of reporting any/all new or changed pain symptoms or management strategies to pain management provider Advised patient to report to care team affect of pain on daily activities Reviewed with patient prescribed pharmacological and nonpharmacological pain relief strategies Assessed social determinant of health barriers  Patient Goals/Self-Care Activities: Take all medications as prescribed Attend all scheduled provider appointments Call pharmacy for medication refills 3-7 days in advance of running out of medications Perform all self care activities independently  Perform IADL's (shopping, preparing meals, housekeeping, managing finances) independently Call provider office for new concerns or questions  03/17/23:  To reschedule GI appt-completed 05/16/23:  Schedule Neurosurgery appt-completed  Follow Up Plan:  The patient has been provided with contact information for the care management team and has been advised to call with any health related questions or concerns.  The care management team will reach out to the patient again over the next 45 business  days.

## 2023-07-17 NOTE — Patient Outreach (Signed)
Medicaid Managed Care   Nurse Care Manager Note  07/17/2023 Name:  Sara Munoz MRN:  [id number] DOB:  Jun 10, 1966  Sara Munoz is an 58 y.o. year old female who is a primary patient of [name], Tawanna Cooler, MD.  The [facility] Managed Care Coordination team was consulted for assistance with:    Chronic healthcare management needs, tobacco use, chronic pain, HTN. GERD, CAD, COPD, OSA, anxiety, osteoarthritis, HLD, PAD, PVD  [name] was given information about Medicaid Managed Care Coordination team services today. Sara Munoz Patient agreed to services and verbal consent obtained.  Engaged with patient by telephone for follow up visit in response to provider referral for case management and/or care coordination services.   Patient is participating in a Managed Medicaid Plan:  Yes  Assessments/Interventions:  Review of past medical history, allergies, medications, health status, including review of consultants reports, laboratory and other test data, was performed as part of comprehensive evaluation and provision of chronic care management services.  SDOH (Social Drivers of Health) assessments and interventions performed: SDOH Interventions    Flowsheet Row Patient Outreach Telephone from 07/17/2023 in [place] [facility] Patient Outreach Telephone from 06/16/2023 in [place] POPULATION HEALTH DEPARTMENT Patient Outreach Telephone from 05/16/2023 in [place] POPULATION HEALTH DEPARTMENT Patient Outreach Telephone from 04/15/2023 in [place] POPULATION HEALTH DEPARTMENT Patient Outreach Telephone from 03/17/2023 in [place] POPULATION HEALTH DEPARTMENT Patient Outreach Telephone from 02/03/2023 in [place] POPULATION HEALTH DEPARTMENT  SDOH Interventions        Food Insecurity Interventions -- -- Intervention Not Indicated -- -- --  Housing Interventions -- -- -- -- -- Intervention Not Indicated  Utilities Interventions -- Intervention Not Indicated  -- -- -- --  Alcohol Usage Interventions -- Intervention Not Indicated (Score <7) -- -- -- --  Financial Strain Interventions Intervention Not Indicated -- -- -- -- --  Physical Activity Interventions -- -- -- Intervention Not Indicated, Other (Comments)  [not able to engage in this type of exercixe] -- --  Stress Interventions -- -- -- -- Provide Counseling, Other (Comment)  [sees therapist as scheduled] --  Social Connections Interventions -- -- -- Intervention Not Indicated -- --  Health Literacy Interventions Intervention Not Indicated -- -- -- -- Intervention Not Indicated     Care Plan Allergies  Allergen Reactions   Cyclobenzaprine Hives, Swelling and Rash    Facial/lip swelling      Levofloxacin Hives, Swelling and Rash   Chlorpheniramine Maleate     Other reaction(s): Unknown   Phenergan [Promethazine Hcl] Other (See Comments)    Agitation.    Phenylephrine Hcl Other (See Comments)    unknown   Toradol [Ketorolac Tromethamine] Swelling and Other (See Comments)    Facial/tongue swelling    Zoloft [Sertraline Hcl] Swelling    Tongue swelling      Cephalexin Hives and Rash   Ketorolac Rash   Tramadol Hives, Swelling, Other (See Comments) and Rash    Lip swelling    Medications Reviewed Today     Reviewed by [name], RN (Registered Nurse) on 07/17/23 at 1230  Med List Status: <None>   Medication Order Taking? Sig Documenting Provider Last Dose Status Informant  amLODipine (NORVASC) 5 MG tablet [id number] No TAKE 1 TABLET BY MOUTH EVERYDAY AT BEDTIME [name], [name], MD 07/09/2023 Active Self  aspirin EC 81 MG tablet [id number] No Take 81 mg by mouth in the morning. Swallow whole. [provider] 07/09/2023 Active Self  atorvastatin (LIPITOR)  40 MG tablet [id number] No Take 1 tablet (40 mg total) by mouth every evening. Debbe Odea, MD 07/09/2023 Active Self  docusate sodium (COLACE) 100 MG capsule [id number] No Take 100 mg by mouth in the  morning. [provider] 07/09/2023 Active Self  gabapentin (NEURONTIN) 600 MG tablet [id number] No Take 600 mg by mouth 4 (four) times daily. [provider] 07/10/2023 Morning Active Self  HYDROmorphone (DILAUDID) 2 MG tablet [id number]  Take 1-2 tablets (2-4 mg total) by mouth every 4 (four) hours as needed for moderate pain (pain score 4-6) or severe pain (pain score 7-10). Lovenia Kim, MD  Active   LINZESS 290 MCG CAPS capsule [id number] No Take 290 mcg by mouth daily as needed (constipation.). [provider] Past Week Active Self  lisinopril (ZESTRIL) 40 MG tablet [id number] No Take 40 mg by mouth every evening. [provider] 07/09/2023 Active Self  methocarbamol (ROBAXIN) 750 MG tablet [id number]  Take 1 tablet (750 mg total) by mouth every 6 (six) hours. Susanne Borders, PA  Active   metoprolol tartrate (LOPRESSOR) 25 MG tablet [id number] No TAKE 1 TABLET BY MOUTH TWICE A DAY [name], [name], MD 07/10/2023 Morning Active Self  mometasone (ELOCON) 0.1 % cream [id number] No Apply 1 Application topically daily as needed (hand rash). Poggi, Excell Seltzer, MD Past Month Active Self  naloxone [facility]) 4 MG/0.1ML LIQD nasal spray kit [id number] No Place 1 spray into the nose as needed (opioid overdose). [provider] Taking Active Self           Med Note Richardean Canal May 03, 2021 11:23 AM)    nitroGLYCERIN (NITROSTAT) 0.4 MG SL tablet [id number] No Place 0.4 mg under the tongue every 5 (five) minutes as needed for chest pain. [provider] Taking Active Self  omeprazole (PRILOSEC) 40 MG capsule [id number] No Take 1 capsule (40 mg total) by mouth 2 (two) times daily. [name], MD 07/10/2023 Morning Active Self  ondansetron (ZOFRAN) 8 MG tablet [id number] No Take 1 tablet (8 mg total) by mouth every 8 (eight) hours as needed for nausea or vomiting. [name], Tawanna Cooler, MD 07/09/2023 Active Self  senna (SENOKOT) 8.6 MG TABS tablet  [id number]  Take 1 tablet (8.6 mg total) by mouth 2 (two) times daily as needed for mild constipation. Susanne Borders, PA  Active   sucralfate (CARAFATE) 1 GM/10ML suspension [id number] No TAKE 10 MLS (1 G TOTAL) BY MOUTH 4 (FOUR) TIMES DAILY. [name], MD 07/09/2023 Active Self  Tiotropium Bromide Monohydrate (SPIRIVA RESPIMAT) 2.5 MCG/ACT AERS [id number] No Take 1 puff by mouth in the morning and at bedtime.  Patient taking differently: Take 1 puff by mouth in the morning.   Jacky Kindle, FNP 07/09/2023 Active Self  umeclidinium bromide (INCRUSE ELLIPTA) 62.5 MCG/ACT AEPB [id number] No Inhale 1 puff into the lungs in the morning. [provider] 07/09/2023 Active Self  VENTOLIN HFA 108 (90 Base) MCG/ACT inhaler [id number] No USE 1 TO 2 INHALATIONS BY MOUTH  INTO THE LUNGS EVERY 6 HOURS AS  NEEDED FOR SHORTNESS OF BREATH  OR WHEEZING [name], [name], MD 07/10/2023 Morning Active Self  zolpidem (AMBIEN) 5 MG tablet [id number] No Take 1 tablet (5 mg total) by mouth at bedtime as needed for sleep. Ronnald Ramp, MD 07/09/2023 Active Self           Patient Active Problem List   Diagnosis Date Noted   Thoracic disc disease with myelopathy 07/10/2023   Other intervertebral  disc degeneration, thoracic region 06/05/2023   Primary insomnia 06/03/2023   Myelopathy concurrent with and due to spinal stenosis of thoracic region [facility]) 05/01/2023   Iron deficiency anemia 04/21/2023   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   History of lumbar fusion 03/18/2023   S/P cervical spinal fusion 03/18/2023   Tendinitis of upper biceps tendon of right shoulder 01/17/2023   Rotator cuff tendinitis, right 01/17/2023   Chronic fatigue 01/06/2023   Prediabetes 01/06/2023   Status post left rotator cuff repair 12/04/2022   Foreign body left in shoulder 12/04/2022   Atopic dermatitis of both hands 09/12/2022   Headache disorder 08/13/2022   Nontraumatic incomplete tear of left  rotator cuff 05/28/2022   Spondylolisthesis 04/19/2022   Encounter for annual physical examination excluding gynecological examination in a patient older than 17 years 04/03/2022   Essential hypertension 03/11/2022   Lumbar spondylosis 02/12/2022   Chronic migraine w/o aura w/o status migrainosus, not intractable 07/09/2021   Obstructive sleep apnea 07/09/2021   Body mass index (BMI) 32.0-32.9, adult 06/07/2021   Pseudoarthrosis of cervical spine (HCC) 05/14/2021   Lumbar radiculopathy 05/23/2020   Sacroiliac inflammation (HCC) 11/25/2019   Sepsis (HCC) 10/19/2018   Severe sepsis (HCC) 10/19/2018   Cervical radiculopathy 02/12/2018   Chronic migraine 02/12/2018   Paresthesia 08/29/2017   Neck pain 08/29/2017   Daily headache 08/29/2017   Chronic active hepatitis (HCC) 07/29/2017   Neurogenic pain 07/31/2016   Chronic pain syndrome 06/05/2016   Carotid artery stenosis 02/27/2016   Chronic constipation 02/27/2016   Chronic nausea 02/27/2016   Hypercholesterolemia 02/27/2016   Osteoarthritis 02/27/2016   PTSD (post-traumatic stress disorder) 02/27/2016   TIA (transient ischemic attack) 02/27/2016   Long term current use of opiate analgesic 02/27/2016   Long term prescription opiate use 02/27/2016   Encounter for pain management consult 02/27/2016   Chronic hip pain (Location of Tertiary source of pain) (Bilateral) (L>R) 02/27/2016   Chronic knee pain (Bilateral) (L>R) 02/27/2016   Chronic shoulder pain (Bilateral) (L>R) 02/27/2016   Chronic sacroiliac joint pain (Bilateral) (L>R) 02/27/2016   Chronic low back pain (Location of Primary Source of Pain) (Bilateral) (L>R) 02/27/2016   Chronic lower extremity pain (Location of Secondary source of pain) (Bilateral) (L>R) 02/27/2016   Osteoarthritis of hip (Bilateral) (L>R) 02/27/2016   Chronic neck pain (posterior midline) (Bilateral) (L>R) 02/27/2016   Cervicogenic headache (Bilateral) (L>R) 02/27/2016   Occipital headache (Bilateral)  (L>R) 02/27/2016   Chronic upper extremity pain (Bilateral) (L>R) 02/27/2016   Chronic Cervical radicular pain (Bilateral) (L>R) 02/27/2016   Chronic lumbar radicular pain (Right) (L5 dermatome) 02/27/2016   Lumbar facet syndrome (Bilateral) (L>R) 02/27/2016   Long term prescription benzodiazepine use 02/27/2016   Hypertension    Chronic obstructive pulmonary disease (HCC) 10/09/2015   GERD (gastroesophageal reflux disease) 10/09/2015   Non-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/2014 08/02/2015   Hyperlipidemia 08/02/2015   Costochondritis 08/02/2015   Drug abuse, 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   GAD (generalized anxiety disorder) 09/03/2014   Narcotic dependence (HCC) 09/03/2014   PVD (peripheral vascular disease) (HCC) 09/03/2014   Smoker 09/03/2014   Cigarette nicotine dependence with nicotine-induced disorder 07/08/2014   Anemia of chronic disease 03/19/2014   Polysubstance abuse (HCC) 03/19/2014   MSSA (methicillin susceptible Staphylococcus aureus) septicemia (HCC) 03/07/2014   Hypomagnesemia 01/04/2014   Chronic cough 09/04/2011   Sleep apnea, obstructive 09/04/2011   Sinusitis, acute 09/04/2011   Conditions to be addressed/monitored per PCP order:  Chronic healthcare management needs, tobacco use, chronic pain, HTN.  GERD, CAD, COPD, OSA, anxiety, osteoarthritis, HLD, PAD, PVD  Care Plan : RN Care Manager Plan of Care  Updates made by [name], RN since 07/17/2023 12:00 AM     Problem: Health Promotion or Disease Self-Management (General Plan of Care)      Long-Range Goal: Chronic Disease Management   Start Date: 09/12/2022  Expected End Date: 09/14/2023  Priority: High  Note:   Current Barriers:  Knowledge Deficits related to plan of care for management of GERD, CAD, HTN, PVD, TIA, migranes, COPD, OSA, tobacco use, osteoarthritis, chronic pain, anxiety  Chronic Disease Management support and education needs related to  GERD, CAD, HTN, PVD, TIA, migranes, COPD, OSA, tobacco use,  osteoarthritis, chronic pain, anxiety  07/17/23:  Patient recovering from laminectomy-hospitalized 1-16 to 1-20.  Has post op appt next week.  Pain managed with pain meds.  Has decreased smoking to less than half a pack a day-plans on asking PCP for patch.  BP stable.  Receiving HH services-PT/OT twice a week.  Using wheelchair as needed.    RNCM Clinical Goal(s):  Patient will verbalize understanding of plan for management of GERD, CAD, HTN, PVD, TIA, migranes, COPD, OSA, tobacco use, osteoarthritis, chronic pain, anxiety   as evidenced by patient report verbalize basic understanding of  GERD, CAD, HTN, PVD, TIA, migranes, COPD, OSA, tobacco use, osteoarthritis, chronic pain, anxiety  disease process and self health management plan as evidenced by patient report take all medications exactly as prescribed and will call provider for medication related questions as evidenced by patient report  demonstrate understanding of rationale for each prescribed medication as evidenced by patient report attend all scheduled medical appointments as evidenced by patient report and EMR review demonstrate Ongoing adherence to prescribed treatment plan for  GERD, CAD, HTN, PVD, TIA, migranes, COPD, OSA, tobacco use, osteoarthritis, chronic pain, anxiety as evidenced by patient report continue to work with RN Care Manager to address care management and care coordination needs related to GERD, CAD, HTN, PVD, TIA, migranes, COPD, OSA, tobacco use, osteoarthritis, chronic pain, anxiety  as evidenced by adherence to CM Team Scheduled appointments through collaboration with RN Care manager, provider, and care team.   Interventions: Inter-disciplinary care team collaboration (see longitudinal plan of care) Evaluation of current treatment plan related to  self management and patient's adherence to plan as established by provider 01/03/23-discussed protein drink supplementation, MVI  CAD Interventions: (Status:  New goal.) Long  Term Goal Assessed understanding of CAD diagnosis Medications reviewed including medications utilized in CAD treatment plan Counseled on importance of regular laboratory monitoring as prescribed Reviewed Importance of taking all medications as prescribed Reviewed Importance of attending all scheduled provider appointments Advised to report any changes in symptoms or exercise tolerance Screening for signs and symptoms of depression related to chronic disease state Assessed social determinant of health barriers  COPD Interventions:  (Status:  New goal.) Long Term Goal Advised patient to self assesses COPD action plan zone and make appointment with provider if in the yellow zone for 48 hours without improvement Advised patient to engage in light exercise as tolerated 3-5 days a week to aid in the the management of COPD Provided education about and advised patient to utilize infection prevention strategies to reduce risk of respiratory infection Discussed the importance of adequate rest and management of fatigue with COPD Assessed social determinant of health barriers  Hypertension Interventions:  (Status:  New goal.) Long Term Goal Last practice recorded BP readings:  BP Readings from Last  3 Encounters:        05/16/23 117-140/94  06/16/23         135/93 07/17/23         128/65  Most recent eGFR/CrCl:  Lab Results  Component Value Date   EGFR 83 04/08/2022    No components found for: "CRCL"  Evaluation of current treatment plan related to hypertension self management and patient's adherence to plan as established by provider Reviewed medications with patient and discussed importance of compliance Discussed plans with patient for ongoing care management follow up and provided patient with direct contact information for care management team Advised patient, providing education and rationale, to monitor blood pressure daily and record, calling PCP for findings outside established  parameters Reviewed scheduled/upcoming provider appointments including:  Discussed complications of poorly controlled blood pressure such as heart disease, stroke, circulatory complications, vision complications, kidney impairment, sexual dysfunction Assessed social determinant of health barriers  Pain Interventions:  (Status:  New goal.) Long Term Goal Pain assessment performed Medications reviewed Reviewed provider established plan for pain management Discussed importance of adherence to all scheduled medical appointments Counseled on the importance of reporting any/all new or changed pain symptoms or management strategies to pain management provider Advised patient to report to care team affect of pain on daily activities Reviewed with patient prescribed pharmacological and nonpharmacological pain relief strategies Assessed social determinant of health barriers  Patient Goals/Self-Care Activities: Take all medications as prescribed Attend all scheduled provider appointments Call pharmacy for medication refills 3-7 days in advance of running out of medications Perform all self care activities independently  Perform IADL's (shopping, preparing meals, housekeeping, managing finances) independently Call provider office for new concerns or questions  03/17/23:  To reschedule GI appt-completed 05/16/23:  Schedule Neurosurgery appt-completed  Follow Up Plan:  The patient has been provided with contact information for the care management team and has been advised to call with any health related questions or concerns.  The care management team will reach out to the patient again over the next 45 business  days.   Follow Up:  Patient agrees to Care Plan and Follow-up.  Plan: The Managed Medicaid care management team will reach out to the patient again over the next 30 business  days. and The  Patient has been provided with contact information for the Managed Medicaid care management team and  has been advised to call with any health related questions or concerns.  Date/time of next scheduled RN care management/care coordination outreach:  08/15/23 at 315.

## 2023-07-17 NOTE — Telephone Encounter (Signed)
Carolyn physical therapist at [facility] has called to inform us that the patient will have an occupational therapy evaluation next week.  Also wanted verbal orders for balance and weight training for the patient.  Please advise- [phone]

## 2023-07-18 NOTE — Telephone Encounter (Signed)
Left message that Dr Katrinka Blazing said OK to all orders, but with a <10lb lifting restriction

## 2023-07-19 ENCOUNTER — Other Ambulatory Visit: Payer: Self-pay

## 2023-07-19 DIAGNOSIS — M549 Dorsalgia, unspecified: Secondary | ICD-10-CM | POA: Diagnosis present

## 2023-07-19 DIAGNOSIS — G8918 Other acute postprocedural pain: Secondary | ICD-10-CM | POA: Insufficient documentation

## 2023-07-19 LAB — CBC
HCT: 25.5 % — ABNORMAL LOW (ref 36.0–46.0)
Hemoglobin: 7.7 g/dL — ABNORMAL LOW (ref 12.0–15.0)
MCH: 22.8 pg — ABNORMAL LOW (ref 26.0–34.0)
MCHC: 30.2 g/dL (ref 30.0–36.0)
MCV: 75.7 fL — ABNORMAL LOW (ref 80.0–100.0)
Platelets: 373 10*3/uL (ref 150–400)
RBC: 3.37 MIL/uL — ABNORMAL LOW (ref 3.87–5.11)
RDW: 17.8 % — ABNORMAL HIGH (ref 11.5–15.5)
WBC: 10.3 10*3/uL (ref 4.0–10.5)
nRBC: 0.7 % — ABNORMAL HIGH (ref 0.0–0.2)

## 2023-07-19 LAB — BASIC METABOLIC PANEL
Anion gap: 13 (ref 5–15)
BUN: 16 mg/dL (ref 6–20)
CO2: 24 mmol/L (ref 22–32)
Calcium: 8.9 mg/dL (ref 8.9–10.3)
Chloride: 97 mmol/L — ABNORMAL LOW (ref 98–111)
Creatinine, Ser: 0.83 mg/dL (ref 0.44–1.00)
GFR, Estimated: >60 mL/min (ref >60)
Glucose, Bld: 100 mg/dL — ABNORMAL HIGH (ref 70–99)
Potassium: 3.9 mmol/L (ref 3.5–5.1)
Sodium: 134 mmol/L — ABNORMAL LOW (ref 135–145)

## 2023-07-19 NOTE — ED Triage Notes (Signed)
Pt to ed from home via POV for back pain. Pt had back surgery last week and hasn't been able to get her pain under control. Pt has Q 4 hour 2 mg of PO dilaudid. She took her last dose around 730 tonight. Pt is caox4, in no acute distress in triage but is unable to get comfortable in the wheelchair.

## 2023-07-20 ENCOUNTER — Emergency Department
Admission: EM | Admit: 2023-07-20 | Discharge: 2023-07-20 | Disposition: A | Discharge status: Home / Self Care | Payer: 59 | Attending: Emergency Medicine | Admitting: Emergency Medicine

## 2023-07-20 DIAGNOSIS — G8918 Other acute postprocedural pain: Secondary | ICD-10-CM

## 2023-07-20 MED ORDER — HYDROMORPHONE HCL 1 MG/ML IJ SOLN
1.0000 mg | INTRAMUSCULAR | Status: AC
  Administered 2023-07-20: 1 mg via INTRAVENOUS
  Filled 2023-07-20: qty 1

## 2023-07-20 MED ORDER — DROPERIDOL 2.5 MG/ML IJ SOLN
1.2500 mg | Freq: Once | INTRAMUSCULAR | Status: AC
Start: 2023-07-20 — End: 2023-07-20
  Administered 2023-07-20: 1.25 mg via INTRAVENOUS
  Filled 2023-07-20: qty 2

## 2023-07-20 NOTE — Discharge Instructions (Signed)
Please continue to take your prescribed medication including your pain medicine according to the label instructions.  Follow-up with Dr. Katrinka Blazing at the next available opportunity.  Return to the emergency department if you develop new or worsening symptoms that concern you.

## 2023-07-20 NOTE — ED Provider Notes (Signed)
[facility] Provider Note    Event Date/Time   First MD Initiated Contact with Patient 07/20/23 0120     (approximate)   History   Back Pain   HPI Sara Munoz is a 58 y.o. female who presents for postoperative pain.  She had thoracic spine surgery with Dr. Katrinka Blazing about a week ago (decompression via a transpedicular discectomy at T8-9 and T9-10 with an arthrodesis from T8-T11 as per the op note).  She also has extensive chronic pain syndrome and a documented history of opioid dependence for which she has a pain contract as an outpatient.  She has a prescription for oral Dilaudid from her neurosurgeon, but she said that the pain was worse today, possibly because she has tried to be up and moving around and walking to get her back on her feet and moving around, but she thinks she may have overdone it, so the oral pain medicine is not working.  She has had no fever, nausea, vomiting, and no new numbness, tingling, nor weakness.     Physical Exam   Triage Vital Signs: ED Triage Vitals [07/19/23 2253]  Encounter Vitals Group     BP (!) 158/71     Systolic BP Percentile      Diastolic BP Percentile      Pulse Rate 77     Resp 16     Temp 98.2 F (36.8 C)     Temp Source Oral     SpO2 95 %     Weight 85 kg (187 lb 6.3 oz)     Height 1.626 m (5\' 4" )     Head Circumference      Peak Flow      Pain Score 10     Pain Loc      Pain Education      Exclude from Growth Chart     Most recent vital signs: Vitals:   07/20/23 0249 07/20/23 0343  BP: 115/64 (!) 114/54  Pulse:  70  Resp:  16  Temp: 98.7 F (37.1 C)   SpO2: 100% 98%    General: Awake, appears uncomfortable but nontoxic. CV:  Good peripheral perfusion.  Regular rate and rhythm. Resp:  Normal effort. Speaking easily and comfortably, no accessory muscle usage nor intercostal retractions.   Abd:  No distention.  Other:  Well-appearing thoracic spine surgical scar with no wound dehiscence,  erythema, induration or fluctuance, and no significant tenderness to palpation around the area.  No surrounding cellulitis.  Patient is ambulatory although she says it hurts to do so.  No acute focal neurological deficits.   ED Results / Procedures / Treatments   Labs (all labs ordered are listed, but only abnormal results are displayed) Labs Reviewed  CBC - Abnormal; Notable for the following components:      Result Value   RBC 3.37 (*)    Hemoglobin 7.7 (*)    HCT 25.5 (*)    MCV 75.7 (*)    MCH 22.8 (*)    RDW 17.8 (*)    nRBC 0.7 (*)    All other components within normal limits  BASIC METABOLIC PANEL - Abnormal; Notable for the following components:   Sodium 134 (*)    Chloride 97 (*)    Glucose, Bld 100 (*)    All other components within normal limits     PROCEDURES:  Critical Care performed: No  Procedures    IMPRESSION / MDM / ASSESSMENT AND PLAN /  ED COURSE  I reviewed the triage vital signs and the nursing notes.                              Differential diagnosis includes, but is not limited to, postoperative pain, acute on chronic pain, postop infection, wound dehiscence, abscess, radiculopathy.  Patient's presentation is most consistent with acute presentation with potential threat to life or bodily function.  Labs/studies ordered: Basic metabolic panel, CBC  Interventions/Medications given:  Medications  HYDROmorphone (DILAUDID) injection 1 mg (1 mg Intravenous Given 07/20/23 0207)  droperidol (INAPSINE) 2.5 MG/ML injection 1.25 mg (1.25 mg Intravenous Given 07/20/23 0207)    (Note:  hospital course my include additional interventions and/or labs/studies not listed above.)   Patient is generally well-appearing, afebrile, no tachycardia, and has some pain that seems to be related to her chronic pain syndrome and overexertion as well as the recent surgery.  There is no evidence that she is having a postoperative complication other than pain.  I  considered imaging and neurosurgery consultation, but she and I talked about it and we agreed on a dose of Dilaudid 1 mg IV and I will also supplement that with droperidol 1.25 mg IV both to avoid nausea and vomiting as well as to act as an adjuvant medication to help with her chronic pain.  If she is still experiencing worsening pain after the medication I will consider imaging but I suspect helping with the acute pain we will allow her to be discharged and follow-up as an outpatient with her regular home pain medication regimen.     Clinical Course as of 07/20/23 0503  Wynelle Link Jul 20, 2023  0317 Patient awake and alert despite some somnolence from the medication.  Feels much better and says she is ready to go.  No evidence of an emergent medical condition requiring neurosurgical consultation or admission.  She will call the office of Dr. Katrinka Blazing to schedule follow-up appointment and to let him know about the visit.  I gave my usual and customary return precautions. [CF]    Clinical Course User Index [CF] Loleta Rose, MD     FINAL CLINICAL IMPRESSION(S) / ED DIAGNOSES   Final diagnoses:  Post-operative pain     Rx / DC Orders   ED Discharge Orders     None        Note:  This document was prepared using Dragon voice recognition software and may include unintentional dictation errors.   Loleta Rose, MD 07/20/23 [phone]

## 2023-07-21 ENCOUNTER — Telehealth: Payer: Self-pay | Admitting: Neurosurgery

## 2023-07-21 NOTE — Telephone Encounter (Signed)
  Media Information   Document Information  FYI patient went to the ER.

## 2023-07-21 NOTE — Telephone Encounter (Signed)
Spoke to patient and she is doing better today. She did go to the ER to get the pain under control. She states she was up all day moving around on Saturday and she thought that was going to help and it made the pain worse. I informed her it is better to get up and move several times a day for short periods. Patient expressed understanding and will see [place] on 1/29.

## 2023-07-23 ENCOUNTER — Other Ambulatory Visit: Payer: Self-pay | Admitting: Family Medicine

## 2023-07-23 ENCOUNTER — Ambulatory Visit (INDEPENDENT_AMBULATORY_CARE_PROVIDER_SITE_OTHER): Payer: 59 | Admitting: Physician Assistant

## 2023-07-23 DIAGNOSIS — M4804 Spinal stenosis, thoracic region: Secondary | ICD-10-CM

## 2023-07-23 DIAGNOSIS — Z981 Arthrodesis status: Secondary | ICD-10-CM

## 2023-07-23 DIAGNOSIS — I10 Essential (primary) hypertension: Secondary | ICD-10-CM
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
## 2023-07-24 ENCOUNTER — Other Ambulatory Visit: Payer: Self-pay | Admitting: Gastroenterology

## 2023-07-29 DIAGNOSIS — G894 Chronic pain syndrome: Secondary | ICD-10-CM | POA: Diagnosis not present

## 2023-07-29 DIAGNOSIS — J449 Chronic obstructive pulmonary disease, unspecified: Secondary | ICD-10-CM | POA: Diagnosis not present

## 2023-07-29 DIAGNOSIS — M4804 Spinal stenosis, thoracic region: Secondary | ICD-10-CM | POA: Diagnosis not present

## 2023-07-29 DIAGNOSIS — Z981 Arthrodesis status: Secondary | ICD-10-CM | POA: Diagnosis not present

## 2023-07-29 DIAGNOSIS — Z4789 Encounter for other orthopedic aftercare: Secondary | ICD-10-CM | POA: Diagnosis not present

## 2023-07-29 DIAGNOSIS — M5114 Intervertebral disc disorders with radiculopathy, thoracic region: Secondary | ICD-10-CM | POA: Diagnosis not present

## 2023-07-29 DIAGNOSIS — M4726 Other spondylosis with radiculopathy, lumbar region: Secondary | ICD-10-CM | POA: Diagnosis not present

## 2023-07-29 DIAGNOSIS — M5104 Intervertebral disc disorders with myelopathy, thoracic region: Secondary | ICD-10-CM | POA: Diagnosis not present

## 2023-07-31 ENCOUNTER — Other Ambulatory Visit: Payer: Self-pay | Admitting: Family Medicine

## 2023-07-31 DIAGNOSIS — M5104 Intervertebral disc disorders with myelopathy, thoracic region: Secondary | ICD-10-CM | POA: Diagnosis not present

## 2023-07-31 DIAGNOSIS — I10 Essential (primary) hypertension: Secondary | ICD-10-CM

## 2023-07-31 DIAGNOSIS — Z981 Arthrodesis status: Secondary | ICD-10-CM | POA: Diagnosis not present

## 2023-07-31 DIAGNOSIS — M4804 Spinal stenosis, thoracic region: Secondary | ICD-10-CM | POA: Diagnosis not present

## 2023-07-31 DIAGNOSIS — Z4789 Encounter for other orthopedic aftercare: Secondary | ICD-10-CM | POA: Diagnosis not present

## 2023-07-31 DIAGNOSIS — G894 Chronic pain syndrome: Secondary | ICD-10-CM | POA: Diagnosis not present

## 2023-07-31 DIAGNOSIS — M5114 Intervertebral disc disorders with radiculopathy, thoracic region: Secondary | ICD-10-CM | POA: Diagnosis not present

## 2023-07-31 DIAGNOSIS — J449 Chronic obstructive pulmonary disease, unspecified: Secondary | ICD-10-CM | POA: Diagnosis not present

## 2023-07-31 DIAGNOSIS — M4726 Other spondylosis with radiculopathy, lumbar region: Secondary | ICD-10-CM | POA: Diagnosis not present

## 2023-08-04 ENCOUNTER — Encounter: Payer: Self-pay | Admitting: Family Medicine

## 2023-08-04 DIAGNOSIS — M5114 Intervertebral disc disorders with radiculopathy, thoracic region: Secondary | ICD-10-CM | POA: Diagnosis not present

## 2023-08-04 DIAGNOSIS — Z981 Arthrodesis status: Secondary | ICD-10-CM | POA: Diagnosis not present

## 2023-08-04 DIAGNOSIS — G894 Chronic pain syndrome: Secondary | ICD-10-CM | POA: Diagnosis not present

## 2023-08-04 DIAGNOSIS — J449 Chronic obstructive pulmonary disease, unspecified: Secondary | ICD-10-CM | POA: Diagnosis not present

## 2023-08-04 DIAGNOSIS — M4726 Other spondylosis with radiculopathy, lumbar region: Secondary | ICD-10-CM | POA: Diagnosis not present

## 2023-08-04 DIAGNOSIS — M4804 Spinal stenosis, thoracic region: Secondary | ICD-10-CM | POA: Diagnosis not present

## 2023-08-04 DIAGNOSIS — Z4789 Encounter for other orthopedic aftercare: Secondary | ICD-10-CM | POA: Diagnosis not present

## 2023-08-04 DIAGNOSIS — M5104 Intervertebral disc disorders with myelopathy, thoracic region: Secondary | ICD-10-CM | POA: Diagnosis not present

## 2023-08-05 DIAGNOSIS — M545 Low back pain, unspecified: Secondary | ICD-10-CM | POA: Diagnosis not present

## 2023-08-05 DIAGNOSIS — Z79891 Long term (current) use of opiate analgesic: Secondary | ICD-10-CM | POA: Diagnosis not present

## 2023-08-05 DIAGNOSIS — M542 Cervicalgia: Secondary | ICD-10-CM | POA: Diagnosis not present

## 2023-08-05 DIAGNOSIS — M4726 Other spondylosis with radiculopathy, lumbar region: Secondary | ICD-10-CM | POA: Diagnosis not present

## 2023-08-05 DIAGNOSIS — M4804 Spinal stenosis, thoracic region: Secondary | ICD-10-CM | POA: Diagnosis not present

## 2023-08-05 DIAGNOSIS — Z4789 Encounter for other orthopedic aftercare: Secondary | ICD-10-CM | POA: Diagnosis not present

## 2023-08-05 DIAGNOSIS — G894 Chronic pain syndrome: Secondary | ICD-10-CM | POA: Diagnosis not present

## 2023-08-05 DIAGNOSIS — M5104 Intervertebral disc disorders with myelopathy, thoracic region: Secondary | ICD-10-CM | POA: Diagnosis not present

## 2023-08-05 DIAGNOSIS — J449 Chronic obstructive pulmonary disease, unspecified: Secondary | ICD-10-CM | POA: Diagnosis not present

## 2023-08-05 DIAGNOSIS — Z981 Arthrodesis status: Secondary | ICD-10-CM | POA: Diagnosis not present

## 2023-08-05 DIAGNOSIS — M5114 Intervertebral disc disorders with radiculopathy, thoracic region: Secondary | ICD-10-CM | POA: Diagnosis not present

## 2023-08-07 ENCOUNTER — Ambulatory Visit: Payer: Self-pay | Admitting: *Deleted

## 2023-08-07 NOTE — Telephone Encounter (Signed)
Summary: cough   Patient states PCP advised her to call if Sx did not improve. Patient is experiencing runny nose, sore throat and coughing and requesting a medication.    Cough,  Reason for Disposition  [1] Sore throat with cough/cold symptoms AND [2] present > 5 days  Answer Assessment - Initial Assessment Questions 1. ONSET: "When did the throat start hurting?" (Hours or days ago)      2 weeks 2. SEVERITY: "How bad is the sore throat?" (Scale 1-10; mild, moderate or severe)   - MILD (1-3):  Doesn't interfere with eating or normal activities.   - MODERATE (4-7): Interferes with eating some solids and normal activities.   - SEVERE (8-10):  Excruciating pain, interferes with most normal activities.   - SEVERE WITH DYSPHAGIA (10): Can't swallow liquids, drooling.     moderate 3. STREP EXPOSURE: "Has there been any exposure to strep within the past week?" If Yes, ask: "What type of contact occurred?"      unknown 4.  VIRAL SYMPTOMS: "Are there any symptoms of a cold, such as a runny nose, cough, hoarse voice or red eyes?"      Cough, nasal congestion 5. FEVER: "Do you have a fever?" If Yes, ask: "What is your temperature, how was it measured, and when did it start?"     no 6. PUS ON THE TONSILS: "Is there pus on the tonsils in the back of your throat?"     Little bit of white and red 7. OTHER SYMPTOMS: "Do you have any other symptoms?" (e.g., difficulty breathing, headache, rash)     Headache- hx migraine  Protocols used: Sore Throat-A-AH

## 2023-08-07 NOTE — Telephone Encounter (Signed)
  Chief Complaint: sore throat Symptoms: sore throat, nasal congestion, cough Frequency: 2 weeks Pertinent Negatives: Patient denies fever Disposition: [] ED /[] Urgent Care (no appt availability in office) / [x] Appointment(In office/virtual)/ []  [facility]/ [] Home Care/ [] Refused Recommended Disposition /[] [facility]/ []  Follow-up with PCP Additional Notes: Patient states she has had sore throat for 2 weeks- red and irritated - appointment scheduled.

## 2023-08-08 ENCOUNTER — Encounter: Payer: Self-pay | Admitting: Physician Assistant

## 2023-08-08 ENCOUNTER — Ambulatory Visit (INDEPENDENT_AMBULATORY_CARE_PROVIDER_SITE_OTHER): Payer: 59 | Admitting: Physician Assistant

## 2023-08-08 VITALS — BP 135/69 | HR 67 | Temp 98.4°F | Ht 64.0 in | Wt 183.0 lb

## 2023-08-08 DIAGNOSIS — J029 Acute pharyngitis, unspecified: Secondary | ICD-10-CM

## 2023-08-08 DIAGNOSIS — R059 Cough, unspecified: Secondary | ICD-10-CM

## 2023-08-08 LAB — POCT RAPID STREP A (OFFICE): Rapid Strep A Screen: NEGATIVE

## 2023-08-08 NOTE — Progress Notes (Signed)
Established patient visit  Patient: Sara Munoz   DOB: February 17, 1966   58 y.o. Female  MRN: [id number] Visit Date: 08/08/2023  Today's healthcare provider: Debera Lat, [name]   Chief Complaint  Patient presents with   Sore Throat    Cough, aching and runny nose.  Present a week.  Has taken tylenol, using cough drops and gargling salt water.  Has not had a flu shot.  Denies exposure to flu.   Subjective      Discussed the use of AI scribe software for clinical note transcription with the patient, who gave verbal consent to proceed.  History of Present Illness   The patient, with a history of chronic bronchitis and recent spinal surgery, presents with a sore throat. She reports a productive cough and body aches, but denies fever. She has a history of similar symptoms in December, which were treated with steroids and antibiotics. She also reports occasional ear discomfort and ringing, and recently experienced itchy eyes, which is unusual for her. She denies seasonal allergies. She has a history of smoking, but has significantly reduced her intake from three packs a day to less than half a pack a day. She also has a history of acid reflux, for which she is taking omeprazole. She reports that her cough worsens when lying down. She has been eating soft foods due to difficulty swallowing.           08/08/2023    9:51 AM 06/03/2023    3:17 PM 03/17/2023    1:26 PM  Depression screen PHQ 2/9  Decreased Interest 1 1 1   Down, Depressed, Hopeless 0 0 0  PHQ - 2 Score 1 1 1   Altered sleeping 1 1   Tired, decreased energy 2 1   Change in appetite 1 1   Feeling bad or failure about yourself  0 0   Trouble concentrating 1 1   Moving slowly or fidgety/restless 0 0   Suicidal thoughts 0 0   PHQ-9 Score 6 5   Difficult doing work/chores  Somewhat difficult       08/08/2023    9:51 AM 06/03/2023    3:17 PM  GAD 7 : Generalized Anxiety Score  Nervous, Anxious, on Edge 1 1  Control/stop  worrying 1 1  Worry too much - different things 1 1  Trouble relaxing 1 1  Restless 1 1  Easily annoyed or irritable 1 1  Afraid - awful might happen 0 0  Total GAD 7 Score 6 6  Anxiety Difficulty  Somewhat difficult    Medications: Outpatient Medications Prior to Visit  Medication Sig   amLODipine (NORVASC) 5 MG tablet TAKE 1 TABLET BY MOUTH EVERYDAY AT BEDTIME   aspirin EC 81 MG tablet Take 81 mg by mouth in the morning. Swallow whole.   atorvastatin (LIPITOR) 40 MG tablet Take 1 tablet (40 mg total) by mouth every evening.   docusate sodium (COLACE) 100 MG capsule Take 100 mg by mouth in the morning.   gabapentin (NEURONTIN) 600 MG tablet Take 600 mg by mouth 4 (four) times daily.   LINZESS 290 MCG CAPS capsule Take 290 mcg by mouth daily as needed (constipation.).   lisinopril (ZESTRIL) 40 MG tablet Take 40 mg by mouth every evening.   methocarbamol (ROBAXIN) 750 MG tablet Take 1 tablet (750 mg total) by mouth every 6 (six) hours.   metoprolol tartrate (LOPRESSOR) 25 MG tablet TAKE ONE-HALF TABLET BY MOUTH  TWICE DAILY   mometasone (ELOCON)  0.1 % cream Apply 1 Application topically daily as needed (hand rash).   naloxone (NARCAN) 4 MG/0.1ML LIQD nasal spray kit Place 1 spray into the nose as needed (opioid overdose).   nitroGLYCERIN (NITROSTAT) 0.4 MG SL tablet Place 0.4 mg under the tongue every 5 (five) minutes as needed for chest pain.   omeprazole (PRILOSEC) 40 MG capsule Take 1 capsule (40 mg total) by mouth 2 (two) times daily.   ondansetron (ZOFRAN) 8 MG tablet Take 1 tablet (8 mg total) by mouth every 8 (eight) hours as needed for nausea or vomiting.   oxyCODONE (ROXICODONE) 15 MG immediate release tablet Take 15 mg by mouth every 4 (four) hours.   senna (SENOKOT) 8.6 MG TABS tablet Take 1 tablet (8.6 mg total) by mouth 2 (two) times daily as needed for mild constipation.   sucralfate (CARAFATE) 1 GM/10ML suspension TAKE 10 MLS (1 G TOTAL) BY MOUTH 4 (FOUR) TIMES DAILY.    Tiotropium Bromide Monohydrate (SPIRIVA RESPIMAT) 2.5 MCG/ACT AERS Take 1 puff by mouth in the morning and at bedtime. (Patient taking differently: Take 1 puff by mouth in the morning.)   umeclidinium bromide (INCRUSE ELLIPTA) 62.5 MCG/ACT AEPB Inhale 1 puff into the lungs in the morning.   VENTOLIN HFA 108 (90 Base) MCG/ACT inhaler USE 1 TO 2 INHALATIONS BY MOUTH  INTO THE LUNGS EVERY 6 HOURS AS  NEEDED FOR SHORTNESS OF BREATH  OR WHEEZING   zolpidem (AMBIEN) 5 MG tablet Take 1 tablet (5 mg total) by mouth at bedtime as needed for sleep.   [DISCONTINUED] HYDROmorphone (DILAUDID) 2 MG tablet Take 1-2 tablets (2-4 mg total) by mouth every 4 (four) hours as needed for moderate pain (pain score 4-6) or severe pain (pain score 7-10).   No facility-administered medications prior to visit.    Review of Systems All negative Except see HPI       Objective    BP 135/69   Pulse 67   Temp 98.4 F (36.9 C)   Ht 5\' 4"  (1.626 m)   Wt 183 lb (83 kg)   LMP 12/16/2017 (Approximate)   BMI 31.41 kg/m     Physical Exam Vitals reviewed.  Constitutional:      Appearance: She is normal weight.  HENT:     Head: Normocephalic and atraumatic.     Right Ear: Ear canal and external ear normal.     Left Ear: Ear canal and external ear normal.     Nose: Congestion and rhinorrhea present.     Mouth/Throat:     Pharynx: Posterior oropharyngeal erythema present.     Comments: Postnasal drainage noted Eyes:     General: No scleral icterus.       Right eye: No discharge.        Left eye: No discharge.     Extraocular Movements: Extraocular movements intact.     Pupils: Pupils are equal, round, and reactive to light.  Cardiovascular:     Rate and Rhythm: Normal rate and regular rhythm.  Pulmonary:     Effort: Pulmonary effort is normal.     Breath sounds: Normal breath sounds.  Abdominal:     General: Abdomen is flat. Bowel sounds are normal.     Palpations: Abdomen is soft.  Lymphadenopathy:      Cervical: No cervical adenopathy.  Neurological:     Mental Status: She is alert.      Results for orders placed or performed in visit on 08/08/23  POCT rapid strep  A  Result Value Ref Range   Rapid Strep A Screen Negative Negative        Assessment and Plan    Sore Throat Recurrent episodes, most recent in December. No fever, but associated with chills and body aches. No significant redness or inflammation noted on examination. Possible association with acid reflux and chronic bronchitis. -Continue current management for acid reflux with Omeprazole. -Advise warm salt gargles with baking soda and possibly aroma oils. -Recommend over-the-counter Zicam and Cepacol for symptomatic relief. -Consider antihistamines for potential allergic component. -Provide Mucinex samples for potential mucus production.  Chronic Bronchitis History of heavy smoking, now reduced to less than half a pack per day. Occasional wheezing, but none noted on examination. Shortness of breath reported. -Continue Albuterol as needed for wheezing and shortness of breath. -Encourage continued smoking cessation efforts and provide resources for support.  Acid Reflux Currently managed with Omeprazole. Possible contribution to sore throat and cough. -Continue Omeprazole. -Advise lifestyle modifications including diet and positioning.  General Health Maintenance -Encourage hydration and use of Tylenol for pain and inflammation as needed. -Advise patient to explore CDC resources for smoking cessation support.      Orders Placed This Encounter  Procedures   POCT rapid strep A    Associate with J02.9    No follow-ups on file.   The patient was advised to call back or seek an in-person evaluation if the symptoms worsen or if the condition fails to improve as anticipated.  I discussed the assessment and treatment plan with the patient. The patient was provided an opportunity to ask questions and all were  answered. The patient agreed with the plan and demonstrated an understanding of the instructions.  I, Debera Lat, [name] have reviewed all documentation for this visit. The documentation on 08/08/2023  for the exam, diagnosis, procedures, and orders are all accurate and complete.  Debera Lat, [facility], MMS [facility] [phone] (phone) [phone] (fax)  [facility]

## 2023-08-11 ENCOUNTER — Other Ambulatory Visit: Payer: Self-pay

## 2023-08-11 DIAGNOSIS — M4726 Other spondylosis with radiculopathy, lumbar region: Secondary | ICD-10-CM | POA: Diagnosis not present

## 2023-08-11 DIAGNOSIS — E785 Hyperlipidemia, unspecified: Secondary | ICD-10-CM

## 2023-08-11 DIAGNOSIS — M5114 Intervertebral disc disorders with radiculopathy, thoracic region: Secondary | ICD-10-CM | POA: Diagnosis not present

## 2023-08-11 DIAGNOSIS — M4804 Spinal stenosis, thoracic region: Secondary | ICD-10-CM | POA: Diagnosis not present

## 2023-08-11 DIAGNOSIS — M5104 Intervertebral disc disorders with myelopathy, thoracic region: Secondary | ICD-10-CM | POA: Diagnosis not present

## 2023-08-11 DIAGNOSIS — G894 Chronic pain syndrome: Secondary | ICD-10-CM | POA: Diagnosis not present

## 2023-08-11 DIAGNOSIS — J449 Chronic obstructive pulmonary disease, unspecified: Secondary | ICD-10-CM | POA: Diagnosis not present

## 2023-08-11 DIAGNOSIS — Z4789 Encounter for other orthopedic aftercare: Secondary | ICD-10-CM | POA: Diagnosis not present

## 2023-08-11 DIAGNOSIS — Z981 Arthrodesis status: Secondary | ICD-10-CM | POA: Diagnosis not present

## 2023-08-11 MED ORDER — ATORVASTATIN CALCIUM 40 MG PO TABS: 40.0000 mg | ORAL_TABLET | Freq: Every evening | ORAL | 0 refills | Status: DC

## 2023-08-11 NOTE — Telephone Encounter (Signed)
Requested Prescriptions   Signed Prescriptions Disp Refills   atorvastatin (LIPITOR) 40 MG tablet 30 tablet 0    Sig: Take 1 tablet (40 mg total) by mouth every evening.    Authorizing Provider: Debbe Odea    Ordering User: Guerry Minors

## 2023-08-15 ENCOUNTER — Other Ambulatory Visit: Payer: Self-pay | Admitting: Obstetrics and Gynecology

## 2023-08-15 DIAGNOSIS — Z981 Arthrodesis status: Secondary | ICD-10-CM | POA: Diagnosis not present

## 2023-08-15 DIAGNOSIS — M5114 Intervertebral disc disorders with radiculopathy, thoracic region: Secondary | ICD-10-CM | POA: Diagnosis not present

## 2023-08-15 DIAGNOSIS — J449 Chronic obstructive pulmonary disease, unspecified: Secondary | ICD-10-CM | POA: Diagnosis not present

## 2023-08-15 DIAGNOSIS — M5104 Intervertebral disc disorders with myelopathy, thoracic region: Secondary | ICD-10-CM | POA: Diagnosis not present

## 2023-08-15 DIAGNOSIS — Z4789 Encounter for other orthopedic aftercare: Secondary | ICD-10-CM | POA: Diagnosis not present

## 2023-08-15 DIAGNOSIS — M4804 Spinal stenosis, thoracic region: Secondary | ICD-10-CM | POA: Diagnosis not present

## 2023-08-15 DIAGNOSIS — M4726 Other spondylosis with radiculopathy, lumbar region: Secondary | ICD-10-CM | POA: Diagnosis not present

## 2023-08-15 DIAGNOSIS — G894 Chronic pain syndrome: Secondary | ICD-10-CM | POA: Diagnosis not present

## 2023-08-15 NOTE — Patient Outreach (Signed)
Medicaid Managed Care   Nurse Care Manager Note  08/15/2023 Name:  Sara Munoz MRN:  [id number] DOB:  [birth year]  Sara Munoz is an 58 y.o. year old female who is a primary patient of [name], Tawanna Cooler, MD.  The Medicaid Managed Care Coordination team was consulted for assistance with:    Chronic healthcare management needs, HTN, chronic pain, tobacco use, GERD, CAD, COPD, OSA, anxiety, osteoarthritis, HLD, PAD, PVD  [name] was given information about Medicaid Managed Care Coordination team services today. Sara Munoz Patient agreed to services and verbal consent obtained.  Engaged with patient by telephone for follow up visit in response to provider referral for case management and/or care coordination services.   Patient is participating in a Managed Medicaid Plan:  Yes  Assessments/Interventions:  Review of past medical history, allergies, medications, health status, including review of consultants reports, laboratory and other test data, was performed as part of comprehensive evaluation and provision of chronic care management services.  SDOH (Social Drivers of Health) assessments and interventions performed: SDOH Interventions    Flowsheet Row Patient Outreach Telephone from 08/15/2023 in [place] [facility]ATION [facility] DEPARTMENT Patient Outreach Telephone from 07/17/2023 in [place] POPULATION HEALTH DEPARTMENT Patient Outreach Telephone from 06/16/2023 in [place] POPULATION HEALTH DEPARTMENT Patient Outreach Telephone from 05/16/2023 in [place] POPULATION HEALTH DEPARTMENT Patient Outreach Telephone from 04/15/2023 in [place] POPULATION HEALTH DEPARTMENT Patient Outreach Telephone from 03/17/2023 in [place] POPULATION HEALTH DEPARTMENT  SDOH Interventions        Food Insecurity Interventions -- -- -- Intervention Not Indicated -- --  Utilities Interventions -- -- Intervention Not Indicated -- -- --  Alcohol Usage Interventions -- -- Intervention Not  Indicated (Score <7) -- -- --  Financial Strain Interventions -- Intervention Not Indicated -- -- -- --  Physical Activity Interventions -- -- -- -- Intervention Not Indicated, Other (Comments)  [not able to engage in this type of exercixe] --  Stress Interventions Other (Comment)  [sees therapist] -- -- -- -- Provide Counseling, Other (Comment)  [sees therapist as scheduled]  Social Connections Interventions Intervention Not Indicated -- -- -- Intervention Not Indicated --  Health Literacy Interventions -- Intervention Not Indicated -- -- -- --     Care Plan Allergies  Allergen Reactions   Cyclobenzaprine Hives, Swelling and Rash    Facial/lip swelling      Levofloxacin Hives, Swelling and Rash   Chlorpheniramine Maleate     Other reaction(s): Unknown   Phenergan [Promethazine Hcl] Other (See Comments)    Agitation.    Phenylephrine Hcl Other (See Comments)    unknown   Toradol [Ketorolac Tromethamine] Swelling and Other (See Comments)    Facial/tongue swelling    Zoloft [Sertraline Hcl] Swelling    Tongue swelling      Cephalexin Hives and Rash   Ketorolac Rash   Tramadol Hives, Swelling, Other (See Comments) and Rash    Lip swelling    Medications Reviewed Today     Reviewed by [name], RN (Registered Nurse) on 08/15/23 at 1321  Med List Status: <None>   Medication Order Taking? Sig Documenting Provider Last Dose Status Informant  amLODipine (NORVASC) 5 MG tablet [id number] No TAKE 1 TABLET BY MOUTH EVERYDAY AT BEDTIME [name], [name], MD Taking Active   aspirin EC 81 MG tablet [id number] No Take 81 mg by mouth in the morning. Swallow whole. [provider] Taking Active Self  atorvastatin (LIPITOR) 40 MG tablet [id number]  Take 1  tablet (40 mg total) by mouth every evening. Debbe Odea, MD  Active   docusate sodium (COLACE) 100 MG capsule [id number] No Take 100 mg by mouth in the morning. [provider] Taking Active Self  gabapentin  (NEURONTIN) 600 MG tablet [id number] No Take 600 mg by mouth 4 (four) times daily. [provider] Taking Active Self  LINZESS 290 MCG CAPS capsule [id number] No Take 290 mcg by mouth daily as needed (constipation.). [provider] Taking Active Self  lisinopril (ZESTRIL) 40 MG tablet [id number] No Take 40 mg by mouth every evening. [provider] Taking Active Self  methocarbamol (ROBAXIN) 750 MG tablet [id number] No Take 1 tablet (750 mg total) by mouth every 6 (six) hours. Susanne Borders, PA Taking Active   metoprolol tartrate (LOPRESSOR) 25 MG tablet [id number] No TAKE ONE-HALF TABLET BY MOUTH  TWICE DAILY [name], [name], MD Taking Active   mometasone (ELOCON) 0.1 % cream [id number] No Apply 1 Application topically daily as needed (hand rash). Poggi, Excell Seltzer, MD Taking Active Self  naloxone [facility]) 4 MG/0.1ML LIQD nasal spray kit [id number] No Place 1 spray into the nose as needed (opioid overdose). [provider] Taking Active Self           Med Note Richardean Canal May 03, 2021 11:23 AM)    nitroGLYCERIN (NITROSTAT) 0.4 MG SL tablet [id number] No Place 0.4 mg under the tongue every 5 (five) minutes as needed for chest pain. [provider] Taking Active Self  omeprazole (PRILOSEC) 40 MG capsule [id number] No Take 1 capsule (40 mg total) by mouth 2 (two) times daily. [name], MD Taking Active Self  ondansetron [facility]) 8 MG tablet [id number] No Take 1 tablet (8 mg total) by mouth every 8 (eight) hours as needed for nausea or vomiting. [name], [name], MD Taking Active Self  oxyCODONE (ROXICODONE) 15 MG immediate release tablet [id number]  Take 15 mg by mouth every 4 (four) hours. [provider]  Active   senna (SENOKOT) 8.6 MG TABS tablet [id number] No Take 1 tablet (8.6 mg total) by mouth 2 (two) times daily as needed for mild constipation. Susanne Borders, PA Taking Active   sucralfate (CARAFATE) 1 GM/10ML  suspension [id number] No TAKE 10 MLS (1 G TOTAL) BY MOUTH 4 (FOUR) TIMES DAILY. [name], MD Taking Active   Tiotropium Bromide Monohydrate (SPIRIVA RESPIMAT) 2.5 MCG/ACT AERS [id number] No Take 1 puff by mouth in the morning and at bedtime.  Patient taking differently: Take 1 puff by mouth in the morning.   Jacky Kindle, FNP Taking Active Self  umeclidinium bromide (INCRUSE ELLIPTA) 62.5 MCG/ACT AEPB [id number] No Inhale 1 puff into the lungs in the morning. [provider] Taking Active Self  VENTOLIN HFA 108 (90 Base) MCG/ACT inhaler [id number] No USE 1 TO 2 INHALATIONS BY MOUTH  INTO THE LUNGS EVERY 6 HOURS AS  NEEDED FOR SHORTNESS OF BREATH  OR WHEEZING [name], [name], MD Taking Active Self  zolpidem (AMBIEN) 5 MG tablet [id number] No Take 1 tablet (5 mg total) by mouth at bedtime as needed for sleep. 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/10/2023   Other intervertebral disc degeneration, thoracic region 06/05/2023   Primary insomnia 06/03/2023   Myelopathy concurrent with and due to spinal stenosis of thoracic region [facility]) 05/01/2023   Iron deficiency  anemia 04/21/2023   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   History of lumbar fusion 03/18/2023   S/P cervical spinal fusion 03/18/2023   Tendinitis of upper biceps tendon of right shoulder 01/17/2023   Rotator cuff tendinitis, right 01/17/2023   Chronic fatigue 01/06/2023   Prediabetes 01/06/2023   Status post left rotator cuff repair 12/04/2022   Foreign body left in shoulder 12/04/2022   Atopic dermatitis of both hands 09/12/2022   Headache disorder 08/13/2022   Nontraumatic incomplete tear of left rotator cuff 05/28/2022   Spondylolisthesis 04/19/2022   Encounter for annual physical examination excluding gynecological examination in a patient older than 17 years 04/03/2022   Essential  hypertension 03/11/2022   Lumbar spondylosis 02/12/2022   Chronic migraine w/o aura w/o status migrainosus, not intractable 07/09/2021   Obstructive sleep apnea 07/09/2021   Body mass index (BMI) 32.0-32.9, adult 06/07/2021   Pseudoarthrosis of cervical spine (HCC) 05/14/2021   Lumbar radiculopathy 05/23/2020   Sacroiliac inflammation (HCC) 11/25/2019   Sepsis (HCC) 10/19/2018   Severe sepsis (HCC) 10/19/2018   Cervical radiculopathy 02/12/2018   Chronic migraine 02/12/2018   Paresthesia 08/29/2017   Neck pain 08/29/2017   Daily headache 08/29/2017   Chronic active hepatitis (HCC) 07/29/2017   Neurogenic pain 07/31/2016   Chronic pain syndrome 06/05/2016   Carotid artery stenosis 02/27/2016   Chronic constipation 02/27/2016   Chronic nausea 02/27/2016   Hypercholesterolemia 02/27/2016   Osteoarthritis 02/27/2016   PTSD (post-traumatic stress disorder) 02/27/2016   TIA (transient ischemic attack) 02/27/2016   Long term current use of opiate analgesic 02/27/2016   Long term prescription opiate use 02/27/2016   Encounter for pain management consult 02/27/2016   Chronic hip pain (Location of Tertiary source of pain) (Bilateral) (L>R) 02/27/2016   Chronic knee pain (Bilateral) (L>R) 02/27/2016   Chronic shoulder pain (Bilateral) (L>R) 02/27/2016   Chronic sacroiliac joint pain (Bilateral) (L>R) 02/27/2016   Chronic low back pain (Location of Primary Source of Pain) (Bilateral) (L>R) 02/27/2016   Chronic lower extremity pain (Location of Secondary source of pain) (Bilateral) (L>R) 02/27/2016   Osteoarthritis of hip (Bilateral) (L>R) 02/27/2016   Chronic neck pain (posterior midline) (Bilateral) (L>R) 02/27/2016   Cervicogenic headache (Bilateral) (L>R) 02/27/2016   Occipital headache (Bilateral) (L>R) 02/27/2016   Chronic upper extremity pain (Bilateral) (L>R) 02/27/2016   Chronic Cervical radicular pain (Bilateral) (L>R) 02/27/2016   Chronic lumbar radicular pain (Right) (L5  dermatome) 02/27/2016   Lumbar facet syndrome (Bilateral) (L>R) 02/27/2016   Long term prescription benzodiazepine use 02/27/2016   Hypertension    Chronic obstructive pulmonary disease (HCC) 10/09/2015   GERD (gastroesophageal reflux disease) 10/09/2015   Non-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/2014 08/02/2015   Hyperlipidemia 08/02/2015   Costochondritis 08/02/2015   Drug abuse, 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   GAD (generalized anxiety disorder) 09/03/2014   Narcotic dependence (HCC) 09/03/2014   PVD (peripheral vascular disease) (HCC) 09/03/2014   Smoker 09/03/2014   Cigarette nicotine dependence with nicotine-induced disorder 07/08/2014   Anemia of chronic disease 03/19/2014   Polysubstance abuse (HCC) 03/19/2014   MSSA (methicillin susceptible Staphylococcus aureus) septicemia (HCC) 03/07/2014   Hypomagnesemia 01/04/2014   Chronic cough 09/04/2011   Sleep apnea, obstructive 09/04/2011   Sinusitis, acute 09/04/2011   Conditions to be addressed/monitored per PCP order:  Chronic healthcare management needs, HTN, chronic pain, tobacco use, GERD, CAD, COPD, OSA, anxiety, osteoarthritis, HLD, PAD, PVD  Care Plan : RN Care Manager Plan of Care  Updates made by [name], RN since 08/15/2023  12:00 AM     Problem: Health Promotion or Disease Self-Management (General Plan of Care)      Long-Range Goal: Chronic Disease Management   Start Date: 09/12/2022  Expected End Date: 09/14/2023  Priority: High  Note:   Current Barriers:  Knowledge Deficits related to plan of care for management of GERD, CAD, HTN, PVD, TIA, migranes, COPD, OSA, tobacco use, osteoarthritis, chronic pain, anxiety  Chronic Disease Management support and education needs related to  GERD, CAD, HTN, PVD, TIA, migranes, COPD, OSA, tobacco use, osteoarthritis, chronic pain, anxiety  08/15/23:  has reduced smoking to 5 cigarettes a day.  Post op pain continues-pain level 10-6 with pain med.  Continues PT/OT twice a week.  Has post op  and PCP appt this week.  BP stable.  RNCM Clinical Goal(s):  Patient will verbalize understanding of plan for management of GERD, CAD, HTN, PVD, TIA, migranes, COPD, OSA, tobacco use, osteoarthritis, chronic pain, anxiety   as evidenced by patient report verbalize basic understanding of  GERD, CAD, HTN, PVD, TIA, migranes, COPD, OSA, tobacco use, osteoarthritis, chronic pain, anxiety  disease process and self health management plan as evidenced by patient report take all medications exactly as prescribed and will call provider for medication related questions as evidenced by patient report  demonstrate understanding of rationale for each prescribed medication as evidenced by patient report attend all scheduled medical appointments as evidenced by patient report and EMR review demonstrate Ongoing adherence to prescribed treatment plan for  GERD, CAD, HTN, PVD, TIA, migranes, COPD, OSA, tobacco use, osteoarthritis, chronic pain, anxiety as evidenced by patient report continue to work with RN Care Manager to address care management and care coordination needs related to GERD, CAD, HTN, PVD, TIA, migranes, COPD, OSA, tobacco use, osteoarthritis, chronic pain, anxiety  as evidenced by adherence to CM Team Scheduled appointments through collaboration with RN Care manager, provider, and care team.   Interventions: Inter-disciplinary care team collaboration (see longitudinal plan of care) Evaluation of current treatment plan related to  self management and patient's adherence to plan as established by provider 01/03/23-discussed protein drink supplementation, MVI  CAD Interventions: (Status:  New goal.) Long Term Goal Assessed understanding of CAD diagnosis Medications reviewed including medications utilized in CAD treatment plan Counseled on importance of regular laboratory monitoring as prescribed Reviewed Importance of taking all medications as prescribed Reviewed Importance of attending all scheduled  provider appointments Advised to report any changes in symptoms or exercise tolerance Screening for signs and symptoms of depression related to chronic disease state Assessed social determinant of health barriers  COPD Interventions:  (Status:  New goal.) Long Term Goal Advised patient to self assesses COPD action plan zone and make appointment with provider if in the yellow zone for 48 hours without improvement Advised patient to engage in light exercise as tolerated 3-5 days a week to aid in the the management of COPD Provided education about and advised patient to utilize infection prevention strategies to reduce risk of respiratory infection Discussed the importance of adequate rest and management of fatigue with COPD Assessed social determinant of health barriers  Hypertension Interventions:  (Status:  New goal.) Long Term Goal Last practice recorded BP readings:  BP Readings from Last 3 Encounters:        08/15/23            120/70  06/16/23         135/93 07/17/23         128/65  Most recent eGFR/CrCl:  Lab Results  Component Value Date   EGFR 83 04/08/2022    No components found for: "CRCL"  Evaluation of current treatment plan related to hypertension self management and patient's adherence to plan as established by provider Reviewed medications with patient and discussed importance of compliance Discussed plans with patient for ongoing care management follow up and provided patient with direct contact information for care management team Advised patient, providing education and rationale, to monitor blood pressure daily and record, calling PCP for findings outside established parameters Reviewed scheduled/upcoming provider appointments including:  Discussed complications of poorly controlled blood pressure such as heart disease, stroke, circulatory complications, vision complications, kidney impairment, sexual dysfunction Assessed social determinant of health  barriers  Pain Interventions:  (Status:  New goal.) Long Term Goal Pain assessment performed Medications reviewed Reviewed provider established plan for pain management Discussed importance of adherence to all scheduled medical appointments Counseled on the importance of reporting any/all new or changed pain symptoms or management strategies to pain management provider Advised patient to report to care team affect of pain on daily activities Reviewed with patient prescribed pharmacological and nonpharmacological pain relief strategies Assessed social determinant of health barriers  Patient Goals/Self-Care Activities: Take all medications as prescribed Attend all scheduled provider appointments Call pharmacy for medication refills 3-7 days in advance of running out of medications Perform all self care activities independently  Perform IADL's (shopping, preparing meals, housekeeping, managing finances) independently Call provider office for new concerns or questions  03/17/23:  To reschedule GI appt-completed 05/16/23:  Schedule Neurosurgery appt-completed  Follow Up Plan:  The patient has been provided with contact information for the care management team and has been advised to call with any health related questions or concerns.  The care management team will reach out to the patient again over the next 45 business  days.   Follow Up:  Patient agrees to Care Plan and Follow-up.  Plan: The Managed Medicaid care management team will reach out to the patient again over the next 30 business  days. and The  Patient has been provided with contact information for the Managed Medicaid care management team and has been advised to call with any health related questions or concerns.  Date/time of next scheduled RN care management/care coordination outreach: 09/12/23 at 315

## 2023-08-15 NOTE — Patient Instructions (Signed)
Visit Information  Sara Munoz was given information about Medicaid Managed Care team care coordination services as a part of their [place] [employer] benefit. [name] verbally consented to engagement with the [facility] Managed Care team.   If you are experiencing a medical emergency, please call 911 or report to your local emergency department or urgent care.   If you have a non-emergency medical problem during routine business hours, please contact your provider's office and ask to speak with a nurse.   For questions related to your [place] health plan, please call: [phone]  If you would like to schedule transportation through your [place] plan, please call the following number at least 2 days in advance of your appointment: [phone].   There is no limit to the number of trips during the year between medical appointments, healthcare facilities, or pharmacies. Transportation must be scheduled at least 2 business days before but not more than thirty 30 days before of your appointment.  Call the Behavioral Health Crisis Line at [phone], at any time, 24 hours a day, 7 days a week. If you are in danger or need immediate medical attention call 911.  If you would like help to quit smoking, call 1-800-QUIT-NOW (6608387403) OR Espaol: 1-855-Djelo-Ya (2-725-366-4403) o para ms informacin haga clic aqu or Text READY to 474-259 to register via text  [name] - following are the goals we discussed in your visit today: Timeframe:  Long-Range Goal Priority:  High Start Date:       12/20/20                      Expected End Date:   ongoing     Follow up date:  09/12/23  - schedule appointment for flu shot - schedule appointment for vaccines needed due to my age or health - schedule recommended health tests (blood work, mammogram, colonoscopy, pap test) - schedule and keep appointment for annual check-up   Why is this  important?   Screening tests can find diseases early when they are easier to treat.  Your doctor or nurse will talk with you about which tests are important for you.  Getting shots for common diseases like the flu and shingles will help prevent them.   08/15/23:  PCP and post op appt this month, PT/OT twice a week   Patient verbalizes understanding of instructions and care plan provided today and agrees to view in MyChart. Active MyChart status and patient understanding of how to access instructions and care plan via MyChart confirmed with patient.     The Managed Medicaid care management team will reach out to the patient again over the next 30 business  days.  The  Patient   has been provided with contact information for the Managed Medicaid care management team and has been advised to call with any health related questions or concerns.   Sara Der RN, BSN, [employer] [facility] RN Care Manager Direct Dial [phone]/IRJ [phone] Website: [name]   Following is a copy of your plan of care:  Care Plan : RN Care Manager Plan of Care  Updates made by Sara Chandler, RN since 08/15/2023 12:00 AM     Problem: Health Promotion or Disease Self-Management (General Plan of Care)      Long-Range Goal: Chronic Disease Management   Start Date: 09/12/2022  Expected End Date: 09/14/2023  Priority: High  Note:   Current Barriers:  Knowledge Deficits related  to plan of care for management of GERD, CAD, HTN, PVD, TIA, migranes, COPD, OSA, tobacco use, osteoarthritis, chronic pain, anxiety  Chronic Disease Management support and education needs related to  GERD, CAD, HTN, PVD, TIA, migranes, COPD, OSA, tobacco use, osteoarthritis, chronic pain, anxiety  08/15/23:  has reduced smoking to 5 cigarettes a day.  Post op pain continues-pain level 10-6 with pain med.  Continues PT/OT twice a week.  Has post op and PCP appt this week.  BP stable.  RNCM Clinical  Goal(s):  Patient will verbalize understanding of plan for management of GERD, CAD, HTN, PVD, TIA, migranes, COPD, OSA, tobacco use, osteoarthritis, chronic pain, anxiety   as evidenced by patient report verbalize basic understanding of  GERD, CAD, HTN, PVD, TIA, migranes, COPD, OSA, tobacco use, osteoarthritis, chronic pain, anxiety  disease process and self health management plan as evidenced by patient report take all medications exactly as prescribed and will call provider for medication related questions as evidenced by patient report  demonstrate understanding of rationale for each prescribed medication as evidenced by patient report attend all scheduled medical appointments as evidenced by patient report and EMR review demonstrate Ongoing adherence to prescribed treatment plan for  GERD, CAD, HTN, PVD, TIA, migranes, COPD, OSA, tobacco use, osteoarthritis, chronic pain, anxiety as evidenced by patient report continue to work with RN Care Manager to address care management and care coordination needs related to GERD, CAD, HTN, PVD, TIA, migranes, COPD, OSA, tobacco use, osteoarthritis, chronic pain, anxiety  as evidenced by adherence to CM Team Scheduled appointments through collaboration with RN Care manager, provider, and care team.   Interventions: Inter-disciplinary care team collaboration (see longitudinal plan of care) Evaluation of current treatment plan related to  self management and patient's adherence to plan as established by provider 01/03/23-discussed protein drink supplementation, MVI  CAD Interventions: (Status:  New goal.) Long Term Goal Assessed understanding of CAD diagnosis Medications reviewed including medications utilized in CAD treatment plan Counseled on importance of regular laboratory monitoring as prescribed Reviewed Importance of taking all medications as prescribed Reviewed Importance of attending all scheduled provider appointments Advised to report any changes  in symptoms or exercise tolerance Screening for signs and symptoms of depression related to chronic disease state Assessed social determinant of health barriers  COPD Interventions:  (Status:  New goal.) Long Term Goal Advised patient to self assesses COPD action plan zone and make appointment with provider if in the yellow zone for 48 hours without improvement Advised patient to engage in light exercise as tolerated 3-5 days a week to aid in the the management of COPD Provided education about and advised patient to utilize infection prevention strategies to reduce risk of respiratory infection Discussed the importance of adequate rest and management of fatigue with COPD Assessed social determinant of health barriers  Hypertension Interventions:  (Status:  New goal.) Long Term Goal Last practice recorded BP readings:  BP Readings from Last 3 Encounters:        08/15/23            120/70  06/16/23         135/93 07/17/23         128/65  Most recent eGFR/CrCl:  Lab Results  Component Value Date   EGFR 83 04/08/2022    No components found for: "CRCL"  Evaluation of current treatment plan related to hypertension self management and patient's adherence to plan as established by provider Reviewed medications with patient and discussed importance of compliance  Discussed plans with patient for ongoing care management follow up and provided patient with direct contact information for care management team Advised patient, providing education and rationale, to monitor blood pressure daily and record, calling PCP for findings outside established parameters Reviewed scheduled/upcoming provider appointments including:  Discussed complications of poorly controlled blood pressure such as heart disease, stroke, circulatory complications, vision complications, kidney impairment, sexual dysfunction Assessed social determinant of health barriers  Pain Interventions:  (Status:  New goal.) Long Term  Goal Pain assessment performed Medications reviewed Reviewed provider established plan for pain management Discussed importance of adherence to all scheduled medical appointments Counseled on the importance of reporting any/all new or changed pain symptoms or management strategies to pain management provider Advised patient to report to care team affect of pain on daily activities Reviewed with patient prescribed pharmacological and nonpharmacological pain relief strategies Assessed social determinant of health barriers  Patient Goals/Self-Care Activities: Take all medications as prescribed Attend all scheduled provider appointments Call pharmacy for medication refills 3-7 days in advance of running out of medications Perform all self care activities independently  Perform IADL's (shopping, preparing meals, housekeeping, managing finances) independently Call provider office for new concerns or questions  03/17/23:  To reschedule GI appt-completed 05/16/23:  Schedule Neurosurgery appt-completed  Follow Up Plan:  The patient has been provided with contact information for the care management team and has been advised to call with any health related questions or concerns.  The care management team will reach out to the patient again over the next 45 business  days.

## 2023-08-19 ENCOUNTER — Ambulatory Visit (INDEPENDENT_AMBULATORY_CARE_PROVIDER_SITE_OTHER): Payer: 59 | Admitting: Family Medicine

## 2023-08-19 ENCOUNTER — Encounter: Payer: Self-pay | Admitting: Family Medicine

## 2023-08-19 ENCOUNTER — Other Ambulatory Visit: Payer: Self-pay

## 2023-08-19 VITALS — BP 120/60 | HR 62 | Ht 64.0 in | Wt 163.0 lb

## 2023-08-19 DIAGNOSIS — Z4789 Encounter for other orthopedic aftercare: Secondary | ICD-10-CM | POA: Diagnosis not present

## 2023-08-19 DIAGNOSIS — Z981 Arthrodesis status: Secondary | ICD-10-CM | POA: Diagnosis not present

## 2023-08-19 DIAGNOSIS — J449 Chronic obstructive pulmonary disease, unspecified: Secondary | ICD-10-CM | POA: Diagnosis not present

## 2023-08-19 DIAGNOSIS — M5114 Intervertebral disc disorders with radiculopathy, thoracic region: Secondary | ICD-10-CM | POA: Diagnosis not present

## 2023-08-19 DIAGNOSIS — Z0000 Encounter for general adult medical examination without abnormal findings: Secondary | ICD-10-CM

## 2023-08-19 DIAGNOSIS — R110 Nausea: Secondary | ICD-10-CM | POA: Diagnosis not present

## 2023-08-19 DIAGNOSIS — F5101 Primary insomnia: Secondary | ICD-10-CM | POA: Diagnosis not present

## 2023-08-19 DIAGNOSIS — Z0001 Encounter for general adult medical examination with abnormal findings: Secondary | ICD-10-CM | POA: Diagnosis not present

## 2023-08-19 DIAGNOSIS — G894 Chronic pain syndrome: Secondary | ICD-10-CM | POA: Diagnosis not present

## 2023-08-19 DIAGNOSIS — M4804 Spinal stenosis, thoracic region: Secondary | ICD-10-CM

## 2023-08-19 DIAGNOSIS — M4726 Other spondylosis with radiculopathy, lumbar region: Secondary | ICD-10-CM | POA: Diagnosis not present

## 2023-08-19 DIAGNOSIS — M5104 Intervertebral disc disorders with myelopathy, thoracic region: Secondary | ICD-10-CM | POA: Diagnosis not present

## 2023-08-19 MED ORDER — ZOLPIDEM TARTRATE 5 MG PO TABS: 5.0000 mg | ORAL_TABLET | Freq: Every evening | ORAL | 0 refills | Status: AC | PRN

## 2023-08-19 MED ORDER — ONDANSETRON HCL 8 MG PO TABS
8.0000 mg | ORAL_TABLET | Freq: Three times a day (TID) | ORAL | 1 refills | Status: DC | PRN
Start: 2023-08-19 — End: 2024-01-14

## 2023-08-19 NOTE — Progress Notes (Signed)
 Subjective:    Sara Munoz is a 58 y.o. female who presents for a Welcome to Medicare exam.   Cardiac Risk Factors include: family history of premature cardiovascular disease;hypertension;smoking/ tobacco exposure      Objective:    Today's Vitals   08/19/23 1441  BP: 120/60  Pulse: 62  SpO2: 96%  Weight: 163 lb (73.9 kg)  Height: 5\' 4"  (1.626 m)  PainSc: 0-No pain  Body mass index is 27.98 kg/m.  Medications Outpatient Encounter Medications as of 08/19/2023  Medication Sig   amLODipine (NORVASC) 5 MG tablet TAKE 1 TABLET BY MOUTH EVERYDAY AT BEDTIME   aspirin EC 81 MG tablet Take 81 mg by mouth in the morning. Swallow whole.   atorvastatin (LIPITOR) 40 MG tablet Take 1 tablet (40 mg total) by mouth every evening.   docusate sodium (COLACE) 100 MG capsule Take 100 mg by mouth in the morning.   gabapentin (NEURONTIN) 600 MG tablet Take 600 mg by mouth 4 (four) times daily.   LINZESS 290 MCG CAPS capsule Take 290 mcg by mouth daily as needed (constipation.).   lisinopril (ZESTRIL) 40 MG tablet Take 40 mg by mouth every evening.   methocarbamol (ROBAXIN) 750 MG tablet Take 1 tablet (750 mg total) by mouth every 6 (six) hours.   metoprolol tartrate (LOPRESSOR) 25 MG tablet TAKE ONE-HALF TABLET BY MOUTH  TWICE DAILY   mometasone (ELOCON) 0.1 % cream Apply 1 Application topically daily as needed (hand rash).   naloxone (NARCAN) 4 MG/0.1ML LIQD nasal spray kit Place 1 spray into the nose as needed (opioid overdose).   nitroGLYCERIN (NITROSTAT) 0.4 MG SL tablet Place 0.4 mg under the tongue every 5 (five) minutes as needed for chest pain.   omeprazole (PRILOSEC) 40 MG capsule Take 1 capsule (40 mg total) by mouth 2 (two) times daily.   ondansetron (ZOFRAN) 8 MG tablet Take 1 tablet (8 mg total) by mouth every 8 (eight) hours as needed for nausea or vomiting.   oxyCODONE (ROXICODONE) 15 MG immediate release tablet Take 15 mg by mouth every 4 (four) hours.   senna (SENOKOT) 8.6 MG  TABS tablet Take 1 tablet (8.6 mg total) by mouth 2 (two) times daily as needed for mild constipation.   sucralfate (CARAFATE) 1 GM/10ML suspension TAKE 10 MLS (1 G TOTAL) BY MOUTH 4 (FOUR) TIMES DAILY.   Tiotropium Bromide Monohydrate (SPIRIVA RESPIMAT) 2.5 MCG/ACT AERS Take 1 puff by mouth in the morning and at bedtime. (Patient taking differently: Take 1 puff by mouth in the morning.)   umeclidinium bromide (INCRUSE ELLIPTA) 62.5 MCG/ACT AEPB Inhale 1 puff into the lungs in the morning.   VENTOLIN HFA 108 (90 Base) MCG/ACT inhaler USE 1 TO 2 INHALATIONS BY MOUTH  INTO THE LUNGS EVERY 6 HOURS AS  NEEDED FOR SHORTNESS OF BREATH  OR WHEEZING   zolpidem (AMBIEN) 5 MG tablet Take 1 tablet (5 mg total) by mouth at bedtime as needed for sleep.   [DISCONTINUED] ondansetron (ZOFRAN) 8 MG tablet Take 1 tablet (8 mg total) by mouth every 8 (eight) hours as needed for nausea or vomiting.   [DISCONTINUED] zolpidem (AMBIEN) 5 MG tablet Take 1 tablet (5 mg total) by mouth at bedtime as needed for sleep.   No facility-administered encounter medications on file as of 08/19/2023.     History: Past Medical History:  Diagnosis Date   Anemia    Anxiety    Aortic atherosclerosis (HCC)    Asthma    Atypical  chest pain 08/02/2015   Carotid artery disease (HCC)    Chronic back pain    Chronic nausea    Chronic, continuous use of opioids 02/27/2016   a.) has naloxone Rx available   COPD (chronic obstructive pulmonary disease) (HCC)    Coronary artery disease    a.) LHC 2011: no sig CAD (report unavailable); b.) LHC 07/07/2014: 10-20% mLM, 25% LAD, 30-40% LCx, 20-30% RCA -- med mgmt; c.) LHC 02/22/2016: 25% LM, 25% pLCx --> med mgmt; d.) MPI 07/10/2017: no ischemia; e.) MPI 09/01/2019: no ischemia; f.) cCTA 10/28/2022: Ca score 1092 (99th percentile for age/sex/race match control)   Current smoker    DDD (degenerative disc disease), cervical    a.) s/p ACDF C5-C7 and PSIF C5-T1   DDD (degenerative disc  disease), lumbar 05/23/2020   a.) s/p L5-S1 PLIF   GERD (gastroesophageal reflux disease)    Hepatic steatosis    Hepatitis C virus infection cured after antiviral drug therapy    a.) s/p Tx with standard glecaprevir-pibrentasvir course   Hypercholesteremia    Hyperkalemia    Hyperlipidemia    Hypertension    Hypokalemia    Hypomagnesemia 01/04/2014   Incomplete tear of left rotator cuff    Insomnia    a.) on hypnotic PRN (zolpidem)   MI (myocardial infarction) (HCC) 2012   Migraines    MSSA (methicillin susceptible Staphylococcus aureus) septicemia (HCC) 03/03/2014   a.) TEE (-) for valvular vegitation   Nerve pain    per patient, in the lower back   Osteoarthritis    Pneumonia    PTSD (post-traumatic stress disorder)    PVD (peripheral vascular disease) (HCC)    Tendinitis of left rotator cuff    TIA (transient ischemic attack) 2014   Past Surgical History:  Procedure Laterality Date   ANTERIOR CERVICAL DECOMP/DISCECTOMY FUSION N/A 03/30/2018   Procedure: Cervical six-seven Anterior cervical decompression/discectomy/fusion;  Surgeon: Jadene Pierini, MD;  Location: [facility] OR;  Service: Neurosurgery;  Laterality: N/A;   ANTERIOR CERVICAL DECOMP/DISCECTOMY FUSION N/A 01/11/2019   Procedure: Cervical Five-Six Anterior cervical discectomy fusion,  Cervical Five to Cervical Seven anterior instrumented fusion;  Surgeon: Jadene Pierini, MD;  Location: [facility] OR;  Service: Neurosurgery;  Laterality: N/A;  Cervical Five-Six Anterior cervical discectomy fusion,  Cervical Five to Cervical Seven anterior instrumented fusion   APPLICATION OF INTRAOPERATIVE CT SCAN N/A 07/10/2023   Procedure: APPLICATION OF INTRAOPERATIVE CT SCAN;  Surgeon: Lovenia Kim, MD;  Location: [facility] ORS;  Service: Neurosurgery;  Laterality: N/A;   BIOPSY  04/21/2023   Procedure: BIOPSY;  Surgeon: Toney Reil, MD;  Location: [facility] ENDOSCOPY;  Service: Gastroenterology;;   CARDIAC CATHETERIZATION Left  02/22/2016   Procedure: Left Heart Cath and Coronary Angiography;  Surgeon: Alwyn Pea, MD;  Location: [facility] INVASIVE CV LAB;  Service: Cardiovascular;  Laterality: Left;   CATARACT EXTRACTION W/ INTRAOCULAR LENS IMPLANT Right    COLONOSCOPY WITH PROPOFOL N/A 09/19/2016   Procedure: COLONOSCOPY WITH PROPOFOL;  Surgeon: [name], MD;  Location: [facility] ENDOSCOPY;  Service: Endoscopy;  Laterality: N/A;   COLONOSCOPY WITH PROPOFOL N/A 04/21/2023   Procedure: COLONOSCOPY WITH PROPOFOL;  Surgeon: Toney Reil, MD;  Location[facility] ENDOSCOPY;  Service: Gastroenterology;  Laterality: N/A;   DIAGNOSTIC LAPAROSCOPY     ESOPHAGOGASTRODUODENOSCOPY (EGD) WITH PROPOFOL N/A 09/18/2017   Procedure: ESOPHAGOGASTRODUODENOSCOPY (EGD) WITH PROPOFOL;  Surgeon: [name], MD;  Location[facility] ENDOSCOPY;  Service: Gastroenterology;  Laterality: N/A;   ESOPHAGOGASTRODUODENOSCOPY (EGD) WITH PROPOFOL N/A 08/10/2019  Procedure: ESOPHAGOGASTRODUODENOSCOPY (EGD) WITH PROPOFOL;  Surgeon: [name], MD;  Location: [facility] ENDOSCOPY;  Service: Endoscopy;  Laterality: N/A;   ESOPHAGOGASTRODUODENOSCOPY (EGD) WITH PROPOFOL N/A 04/21/2023   Procedure: ESOPHAGOGASTRODUODENOSCOPY (EGD) WITH PROPOFOL;  Surgeon: Toney Reil, MD;  Location: [facility] ENDOSCOPY;  Service: Gastroenterology;  Laterality: N/A;   HEMORRHOID SURGERY     LEFT HEART CATH AND CORONARY ANGIOGRAPHY Left 07/07/2014   LEFT HEART CATH AND CORONARY ANGIOGRAPHY Left 2011   LUMBAR WOUND DEBRIDEMENT N/A 11/05/2022   Procedure: Thoracic one spinous process removal and wound revision;  Surgeon: Jadene Pierini, MD;  Location: [facility] OR;  Service: Neurosurgery;  Laterality: N/A;   NASAL SINUS SURGERY     ORIF FEMUR FRACTURE Left    ORIF TIBIA & FIBULA FRACTURES Right    OVARIAN CYST SURGERY     POSTERIOR CERVICAL FUSION/FORAMINOTOMY N/A 05/14/2021   Procedure: Cervical five, Cervical six Laminectomy,foraminotomy with Cervical five-six Cervical six-seven  Cervical seven-Thoracic one Posterior instrumented fusion;  Surgeon: Jadene Pierini, MD;  Location: [facility] OR;  Service: Neurosurgery;  Laterality: N/A;   SHOULDER ARTHROSCOPY WITH SUBACROMIAL DECOMPRESSION, ROTATOR CUFF REPAIR AND BICEP TENDON REPAIR Left 05/28/2022   Procedure: SHOULDER ARTHROSCOPY WITH DEBRIDEMENT, DECOMPRESSION, ROTATOR CUFF REPAIR AND BICEP TENDON REPAIR;  Surgeon: Christena Flake, MD;  Location: [facility] ORS;  Service: Orthopedics;  Laterality: Left;   SHOULDER ARTHROSCOPY WITH SUBACROMIAL DECOMPRESSION, ROTATOR CUFF REPAIR AND BICEP TENDON REPAIR Left 12/03/2022   Procedure: SHOULDER Extensive ARTHROSCOPY WITH DEBRIDEMENT, removal of Retained Foreign Body;  Surgeon: Christena Flake, MD;  Location: [facility] ORS;  Service: Orthopedics;  Laterality: Left;   SHOULDER ARTHROSCOPY WITH SUBACROMIAL DECOMPRESSION, ROTATOR CUFF REPAIR AND BICEP TENDON REPAIR Right 03/11/2023   Procedure: RIGHT SHOULDER ARTHROSCOPY WITH DEBRIDMENT, DECOMPRESSION and BICEPS TENODESIS.;  Surgeon: Christena Flake, MD;  Location: [facility] ORS;  Service: Orthopedics;  Laterality: Right;   THORACIC DISCECTOMY N/A 07/10/2023   Procedure: T8-9 & T9-10 TRANSPEDICULAR DECOMPRESSION/DISCECTOMY;  Surgeon: Lovenia Kim, MD;  Location: [facility] ORS;  Service: Neurosurgery;  Laterality: N/A;   TRANSFORAMINAL LUMBAR INTERBODY FUSION W/ MIS 1 LEVEL Right 05/23/2020   Procedure: Right Lumbar Five Sacral One Minimally invasive transforaminal lumbar interbody fusion;  Surgeon: Jadene Pierini, MD;  Location: [facility] OR;  Service: Neurosurgery;  Laterality: Right;  Right Lumbar Five Sacral One Minimally invasive transforaminal lumbar interbody fusion    Family History  Problem Relation Age of Onset   Heart disease Mother    Lung cancer Mother    Ovarian cancer Mother    Healthy Brother    Healthy Brother    Healthy Brother    Diabetes Maternal Uncle    Breast cancer Maternal Aunt    Social History   Occupational History   Occupation:  disabled  Tobacco Use   Smoking status: Every Day    Current packs/day: 0.50    Average packs/day: 0.5 packs/day for 35.0 years (17.5 ttl pk-yrs)    Types: Cigarettes   Smokeless tobacco: Never  Vaping Use   Vaping status: Former   Quit date: 06/24/2013  Substance and Sexual Activity   Alcohol use: Not Currently    Alcohol/week: 0.0 standard drinks of alcohol   Drug use: Not Currently   Sexual activity: Yes    Birth control/protection: None    Tobacco Counseling Ready to quit: Not Answered Counseling given: Not Answered   Immunizations and Health Maintenance Immunization History  Administered Date(s) Administered   Hepatitis A, Adult 06/26/2017, 01/15/2018   Hepatitis B, ADULT  06/26/2017, 07/29/2017, 12/10/2017, 06/23/2018   Health Maintenance Due  Topic Date Due   Pneumococcal Vaccine 59-61 Years old (1 of 2 - PCV) Never done   DTaP/Tdap/Td (1 - Tdap) Never done   Zoster Vaccines- Shingrix (1 of 2) Never done    Activities of Daily Living    08/19/2023    2:37 PM 06/27/2023    9:45 AM  In your present state of health, do you have any difficulty performing the following activities:  Hearing? 0   Vision? 0   Difficulty concentrating or making decisions? 0   Walking or climbing stairs? 1   Dressing or bathing? 0   Doing errands, shopping? 0 1  Comment  doesn't drive  Preparing Food and eating ? N   Using the Toilet? N   In the past six months, have you accidently leaked urine? N   Do you have problems with loss of bowel control? N   Managing your Medications? N   Managing your Finances? N     Physical Exam   Physical Exam Vitals reviewed.  Constitutional:      General: She is not in acute distress.    Appearance: Normal appearance. She is not ill-appearing, toxic-appearing or diaphoretic.  Eyes:     Conjunctiva/sclera: Conjunctivae normal.  Cardiovascular:     Rate and Rhythm: Normal rate and regular rhythm.     Pulses: Normal pulses.     Heart sounds:  Normal heart sounds. No murmur heard.    No friction rub. No gallop.  Pulmonary:     Effort: Pulmonary effort is normal. No respiratory distress.     Breath sounds: No stridor. Examination of the right-lower field reveals wheezing and rhonchi. Examination of the left-lower field reveals wheezing and rhonchi. Wheezing and rhonchi present. No rales.  Abdominal:     General: Bowel sounds are normal. There is no distension.     Palpations: Abdomen is soft.     Tenderness: There is no abdominal tenderness.  Musculoskeletal:     Right lower leg: No edema.     Left lower leg: No edema.  Skin:    Findings: No erythema or rash.  Neurological:     Mental Status: She is alert and oriented to person, place, and time.  Psychiatric:        Mood and Affect: Mood and affect normal.        Speech: Speech normal.        Behavior: Behavior normal. Behavior is cooperative.       EKG:  unchanged from previous tracings      Assessment:    This is a routine wellness examination for this patient .   Vision/Hearing screen No results found.   Goals      Manage My Emotions     Timeframe:  Long-Range Goal Priority:  High Start Date:    01/04/21                         Expected End Date:   ongoing                    Follow Up Date - 05/09/21   - begin personal counseling - call and visit an old friend - check out volunteer opportunities - join a support group - laugh; watch a funny movie or comedian - learn and use visualization or guided imagery - perform a random act of kindness - practice relaxation or meditation  daily - start or continue a personal journal - talk about feelings with a friend, family or spiritual advisor - practice positive thinking and self-talk    Why is this important?   When you are stressed, down or upset, your body reacts too.  For example, your blood pressure may get higher; you may have a headache or stomachache.  When your emotions get the best of you, your  body's ability to fight off cold and flu gets weak.  These steps will help you manage your emotions.     Notes:      Protect My Health     Timeframe:  Long-Range Goal Priority:  High Start Date:       12/20/20                      Expected End Date:   ongoing     Follow up date:  09/12/23  - schedule appointment for flu shot - schedule appointment for vaccines needed due to my age or health - schedule recommended health tests (blood work, mammogram, colonoscopy, pap test) - schedule and keep appointment for annual check-up   Why is this important?   Screening tests can find diseases early when they are easier to treat.  Your doctor or nurse will talk with you about which tests are important for you.  Getting shots for common diseases like the flu and shingles will help prevent them.   08/15/23:  PCP and post op appt this month, PT/OT twice a week       Depression Screen    08/19/2023    2:44 PM 08/08/2023    9:51 AM 06/03/2023    3:17 PM 03/17/2023    1:26 PM  PHQ 2/9 Scores  PHQ - 2 Score 1 1 1 1   PHQ- 9 Score 5 6 5       Fall Risk    06/03/2023    3:17 PM  Fall Risk   Falls in the past year? 1  Number falls in past yr: 1  Injury with Fall? 0    Cognitive Function:        Patient Care Team: Ronnald Ramp, MD as PCP - General (Family Medicine) Debbe Odea, MD as PCP - Cardiology (Cardiology) Center, [facility], Calvert Cantor, RN as Case Manager     Plan:    Postoperative Pain Significant postoperative pain following major back surgery, with numbness on the outside of the back and pain on the inside. Surgeon advised this is normal due to nerve and muscle involvement. - Continue physical therapy and occupational therapy twice a week - Follow up with the surgeon tomorrow - Maintain restrictions on bending, lifting, and twisting  Fatigue Fatigue likely related to recent surgery and ongoing recovery. Demanding healing process and  physical therapy require adequate nutrition. - Take a 50+ multivitamin - Check vitamin levels, complete metabolic panel, anemia panel, W0J, and thyroid function on April 10th  Smoking Cessation Reduced smoking from three packs a day to one pack every three to four days. Continued reduction encouraged for lung health and overall recovery. - Continue to progressively reduce smoking  Medication Management Requires refills for nausea medication (Zofran) and sleeping pills (Ambien). - Refill Zofran - Refill Ambien 5 mg as needed  General Health Maintenance Declined pneumococcal, Tdap, Shingrix, and COVID-19 vaccines. Discussed importance of these vaccines, especially given lung history and recent surgery. - Provide information on recommended vaccines for review - Encourage reconsideration of  tetanus and pneumococcal vaccines  Follow-up - Plan for labs and comprehensive health check on April 10th.        I have personally reviewed and noted the following in the patient's chart:   Medical and social history Use of alcohol, tobacco or illicit drugs  Current medications and supplements Functional ability and status Nutritional status Physical activity Advanced directives List of other physicians Hospitalizations, surgeries, and ER visits in previous 12 months Vitals Screenings to include cognitive, depression, and falls Referrals and appointments  In addition, I have reviewed and discussed with patient certain preventive protocols, quality metrics, and best practice recommendations. A written personalized care plan for preventive services as well as general preventive health recommendations were provided to patient.     Ronnald Ramp, MD 08/19/2023

## 2023-08-20 ENCOUNTER — Encounter: Payer: 59 | Admitting: Neurosurgery

## 2023-08-20 ENCOUNTER — Telehealth: Payer: Self-pay | Admitting: Family Medicine

## 2023-08-20 DIAGNOSIS — I10 Essential (primary) hypertension: Secondary | ICD-10-CM

## 2023-08-20 NOTE — Telephone Encounter (Signed)
 [facility] faxed refill request for the following medications:   lisinopril (PRINIVIL,ZESTRIL) tablet 20 mg   Take 2 tablets by mouth daily with lunch    Please advise.

## 2023-08-21 ENCOUNTER — Other Ambulatory Visit: Payer: Self-pay

## 2023-08-21 MED ORDER — LISINOPRIL 20 MG PO TABS: 20.0000 mg | ORAL_TABLET | Freq: Every day | ORAL | 3 refills | Status: DC

## 2023-08-21 NOTE — Telephone Encounter (Signed)
 Updated to 20 mg daily of lisinopril   New prescription sent to pharmacy

## 2023-08-21 NOTE — Telephone Encounter (Signed)
 Patient advised.

## 2023-08-22 NOTE — Telephone Encounter (Signed)
 Called and spoke to the pt, she has verifed she would like the medication to go to [facility] and not the mail service. She has been informed the medication has been approve and sent to the pharmacy on file which is [facility], she verbally stated she understood and would go pick it up today.

## 2023-08-22 NOTE — Telephone Encounter (Signed)
 2nd Attempt- [facility] lisinopril (ZESTRIL) 20 MG tablet

## 2023-08-25 ENCOUNTER — Ambulatory Visit
Admission: RE | Admit: 2023-08-25 | Discharge: 2023-08-25 | Disposition: A | Discharge status: Home / Self Care | Source: Ambulatory Visit | Attending: Neurosurgery | Admitting: Neurosurgery

## 2023-08-25 ENCOUNTER — Encounter: Payer: Self-pay | Admitting: Neurosurgery

## 2023-08-25 ENCOUNTER — Ambulatory Visit (INDEPENDENT_AMBULATORY_CARE_PROVIDER_SITE_OTHER): Payer: 59 | Admitting: Neurosurgery

## 2023-08-25 ENCOUNTER — Ambulatory Visit
Admission: RE | Admit: 2023-08-25 | Discharge: 2023-08-25 | Disposition: A | Discharge status: Home / Self Care | Attending: Neurosurgery | Admitting: Neurosurgery

## 2023-08-25 VITALS — BP 124/62 | Temp 99.1°F | Ht 64.0 in | Wt 163.0 lb

## 2023-08-25 DIAGNOSIS — G992 Myelopathy in diseases classified elsewhere: Secondary | ICD-10-CM

## 2023-08-25 DIAGNOSIS — M4804 Spinal stenosis, thoracic region: Secondary | ICD-10-CM

## 2023-08-25 DIAGNOSIS — Z09 Encounter for follow-up examination after completed treatment for conditions other than malignant neoplasm: Secondary | ICD-10-CM

## 2023-08-25 DIAGNOSIS — Z981 Arthrodesis status: Secondary | ICD-10-CM | POA: Diagnosis not present

## 2023-08-25 NOTE — Progress Notes (Signed)
   DOS: 07/10/2023, thoracic decompression and fusion  HISTORY OF PRESENT ILLNESS: 08/25/2023 [name] is status post thoracic decompression and fusion for myelopathy.  She has had some reinnervation type symptoms in her lower extremities with intermittent paresthesias.  She states that she is cut down on smoking significantly.  She has had mild appetite.  Has not had any postoperative issues since her last visit.  PHYSICAL EXAMINATION:   Vitals:   08/25/23 1416  BP: 124/62  Temp: 99.1 F (37.3 C)   General: Patient is well developed, well nourished, calm, collected, and in no apparent distress.  NEUROLOGICAL:  General: In no acute distress.  Awake, alert, oriented to person, place, and time. Pupils equal round and reactive to light.   Strength:  Side Iliopsoas Quads Hamstring PF DF EHL  R 5 5 5 5 5 5   L 5 5 5 5 5 5    Incision is showing evidence of delayed healing.  Some eschar present no redness drainage or active areas of opening  ROS (Neurologic): Negative except as noted above  IMAGING: I reviewed her x-rays from today.  They are not formally read, however they do show that the hardware is in good position without any evidence of acute failure  ASSESSMENT/PLAN:  Sara Munoz is doing well after thoracic decompression and fusion.  She is doing well.  She does continue to smoke we reiterated the importance for her to cease smoking for both wound healing and for fusion.  She states that she has been cutting down but has not been able to quit completely.  From a neurologic standpoint she continues to be stable.  She does show some delayed healing of her wound with eschar present, no evidence of drainage or redness or signs of infection.  Will plan to have her continue to follow-up for her 75-month appointment.  She is working with physical therapy.  She is getting around well.  She does continue to have pain as expected.  Lovenia Kim, MD/MS Department of  Neurosurgery

## 2023-08-27 ENCOUNTER — Encounter: Payer: Self-pay | Admitting: Cardiology

## 2023-08-27 ENCOUNTER — Ambulatory Visit: Payer: 59 | Attending: Cardiology | Admitting: Cardiology

## 2023-08-27 VITALS — BP 128/64 | HR 61 | Ht 64.0 in | Wt 180.4 lb

## 2023-08-27 DIAGNOSIS — F17200 Nicotine dependence, unspecified, uncomplicated: Secondary | ICD-10-CM

## 2023-08-27 DIAGNOSIS — I2510 Atherosclerotic heart disease of native coronary artery without angina pectoris: Secondary | ICD-10-CM

## 2023-08-27 DIAGNOSIS — M4726 Other spondylosis with radiculopathy, lumbar region: Secondary | ICD-10-CM | POA: Diagnosis not present

## 2023-08-27 DIAGNOSIS — M5104 Intervertebral disc disorders with myelopathy, thoracic region: Secondary | ICD-10-CM | POA: Diagnosis not present

## 2023-08-27 DIAGNOSIS — E782 Mixed hyperlipidemia: Secondary | ICD-10-CM | POA: Diagnosis not present

## 2023-08-27 DIAGNOSIS — G894 Chronic pain syndrome: Secondary | ICD-10-CM | POA: Diagnosis not present

## 2023-08-27 DIAGNOSIS — IMO0001 Reserved for inherently not codable concepts without codable children: Secondary | ICD-10-CM

## 2023-08-27 DIAGNOSIS — I10 Essential (primary) hypertension: Secondary | ICD-10-CM

## 2023-08-27 DIAGNOSIS — Z4789 Encounter for other orthopedic aftercare: Secondary | ICD-10-CM | POA: Diagnosis not present

## 2023-08-27 DIAGNOSIS — Z981 Arthrodesis status: Secondary | ICD-10-CM | POA: Diagnosis not present

## 2023-08-27 DIAGNOSIS — J449 Chronic obstructive pulmonary disease, unspecified: Secondary | ICD-10-CM | POA: Diagnosis not present

## 2023-08-27 DIAGNOSIS — M5114 Intervertebral disc disorders with radiculopathy, thoracic region: Secondary | ICD-10-CM | POA: Diagnosis not present

## 2023-08-27 DIAGNOSIS — M4804 Spinal stenosis, thoracic region: Secondary | ICD-10-CM | POA: Diagnosis not present

## 2023-08-27 MED ORDER — ISOSORBIDE MONONITRATE ER 30 MG PO TB24: 15.0000 mg | ORAL_TABLET | Freq: Every day | ORAL | 3 refills | Status: DC

## 2023-08-27 NOTE — Progress Notes (Signed)
 Cardiology Office Note:    Date:  08/27/2023   ID:  Sara Munoz, DOB Mar 26, 1966, MRN [id number]  PCP:  Ronnald Ramp, MD   [facility] Providers Cardiologist:  Debbe Odea, MD     Referring MD: Brett Albino*   Chief Complaint  Patient presents with   Follow-up    6 month follow up visit. Patient states that she is always short of breath. Patient states that she has been having chest pain for a while. Meds reviewed.     History of Present Illness:    Sara Munoz is a 58 y.o. female with a hx of nonobstructive CAD (CCTA 5/24, mild stenosis involving LM, pLAD, Lcx), hypertension, hyperlipidemia current smoker x 30+ years, COPD presenting for follow-up.   Complains of occasional chest pain.  She still smokes, has cut back and is working on quitting.  Attributes chest pain to being stressed out at home due to boyfriend drinking, and patient having to pay all the bills.  Currently out of sublingual nitroglycerin.   Prior notes/studies Coronary CT 10/2022 mild left main, proximal LAD, LCx stenosis 25 to 49% Echo 09/2022 EF 60 to 65% Myoview 08/2019 no ischemia, low risk study Echo 08/2019 EF 55 to 60% Left heart cath 2017 25% left main, 25% proximal left circumflex stenosis.  Past Medical History:  Diagnosis Date   Anemia    Anxiety    Aortic atherosclerosis (HCC)    Asthma    Atypical chest pain 08/02/2015   Carotid artery disease (HCC)    Chronic back pain    Chronic nausea    Chronic, continuous use of opioids 02/27/2016   a.) has naloxone Rx available   COPD (chronic obstructive pulmonary disease) (HCC)    Coronary artery disease    a.) LHC 2011: no sig CAD (report unavailable); b.) LHC 07/07/2014: 10-20% mLM, 25% LAD, 30-40% LCx, 20-30% RCA -- med mgmt; c.) LHC 02/22/2016: 25% LM, 25% pLCx --> med mgmt; d.) MPI 07/10/2017: no ischemia; e.) MPI 09/01/2019: no ischemia; f.) cCTA 10/28/2022: Ca score 1092 (99th percentile for  age/sex/race match control)   Current smoker    DDD (degenerative disc disease), cervical    a.) s/p ACDF C5-C7 and PSIF C5-T1   DDD (degenerative disc disease), lumbar 05/23/2020   a.) s/p L5-S1 PLIF   GERD (gastroesophageal reflux disease)    Hepatic steatosis    Hepatitis C virus infection cured after antiviral drug therapy    a.) s/p Tx with standard glecaprevir-pibrentasvir course   Hypercholesteremia    Hyperkalemia    Hyperlipidemia    Hypertension    Hypokalemia    Hypomagnesemia 01/04/2014   Incomplete tear of left rotator cuff    Insomnia    a.) on hypnotic PRN (zolpidem)   MI (myocardial infarction) (HCC) 2012   Migraines    MSSA (methicillin susceptible Staphylococcus aureus) septicemia (HCC) 03/03/2014   a.) TEE (-) for valvular vegitation   Nerve pain    per patient, in the lower back   Osteoarthritis    Pneumonia    PTSD (post-traumatic stress disorder)    PVD (peripheral vascular disease) (HCC)    Tendinitis of left rotator cuff    TIA (transient ischemic attack) 2014    Past Surgical History:  Procedure Laterality Date   ANTERIOR CERVICAL DECOMP/DISCECTOMY FUSION N/A 03/30/2018   Procedure: Cervical six-seven Anterior cervical decompression/discectomy/fusion;  Surgeon: Jadene Pierini, MD;  [facility]: [facility];  Service: Neurosurgery;  Laterality: N/A;   ANTERIOR  CERVICAL DECOMP/DISCECTOMY FUSION N/A 01/11/2019   Procedure: Cervical Five-Six Anterior cervical discectomy fusion,  Cervical Five to Cervical Seven anterior instrumented fusion;  Surgeon: Jadene Pierini, MD;  Location: [facility] OR;  Service: Neurosurgery;  Laterality: N/A;  Cervical Five-Six Anterior cervical discectomy fusion,  Cervical Five to Cervical Seven anterior instrumented fusion   APPLICATION OF INTRAOPERATIVE CT SCAN N/A 07/10/2023   Procedure: APPLICATION OF INTRAOPERATIVE CT SCAN;  Surgeon: Lovenia Kim, MD;  Location: [facility] ORS;  Service: Neurosurgery;  Laterality: N/A;   BIOPSY   04/21/2023   Procedure: BIOPSY;  Surgeon: Toney Reil, MD;  Location: [facility] ENDOSCOPY;  Service: Gastroenterology;;   CARDIAC CATHETERIZATION Left 02/22/2016   Procedure: Left Heart Cath and Coronary Angiography;  Surgeon: Alwyn Pea, MD;  Location: [facility] INVASIVE CV LAB;  Service: Cardiovascular;  Laterality: Left;   CATARACT EXTRACTION W/ INTRAOCULAR LENS IMPLANT Right    COLONOSCOPY WITH PROPOFOL N/A 09/19/2016   Procedure: COLONOSCOPY WITH PROPOFOL;  Surgeon: [name], MD;  Location: [facility] ENDOSCOPY;  Service: Endoscopy;  Laterality: N/A;   COLONOSCOPY WITH PROPOFOL N/A 04/21/2023   Procedure: COLONOSCOPY WITH PROPOFOL;  Surgeon: Toney Reil, MD;  Location: [facility] ENDOSCOPY;  Service: Gastroenterology;  Laterality: N/A;   DIAGNOSTIC LAPAROSCOPY     ESOPHAGOGASTRODUODENOSCOPY (EGD) WITH PROPOFOL N/A 09/18/2017   Procedure: ESOPHAGOGASTRODUODENOSCOPY (EGD) WITH PROPOFOL;  Surgeon: [name], MD;  Location: [facility] ENDOSCOPY;  Service: Gastroenterology;  Laterality: N/A;   ESOPHAGOGASTRODUODENOSCOPY (EGD) WITH PROPOFOL N/A 08/10/2019   Procedure: ESOPHAGOGASTRODUODENOSCOPY (EGD) WITH PROPOFOL;  Surgeon: [name], MD;  Location: [facility] ENDOSCOPY;  Service: Endoscopy;  Laterality: N/A;   ESOPHAGOGASTRODUODENOSCOPY (EGD) WITH PROPOFOL N/A 04/21/2023   Procedure: ESOPHAGOGASTRODUODENOSCOPY (EGD) WITH PROPOFOL;  Surgeon: Toney Reil, MD;  Location: [facility] ENDOSCOPY;  Service: Gastroenterology;  Laterality: N/A;   HEMORRHOID SURGERY     LEFT HEART CATH AND CORONARY ANGIOGRAPHY Left 07/07/2014   LEFT HEART CATH AND CORONARY ANGIOGRAPHY Left 2011   LUMBAR WOUND DEBRIDEMENT N/A 11/05/2022   Procedure: Thoracic one spinous process removal and wound revision;  Surgeon: Jadene Pierini, MD;  Location: [facility];  Service: Neurosurgery;  Laterality: N/A;   NASAL SINUS SURGERY     ORIF FEMUR FRACTURE Left    ORIF TIBIA & FIBULA FRACTURES Right    OVARIAN CYST SURGERY      POSTERIOR CERVICAL FUSION/FORAMINOTOMY N/A 05/14/2021   Procedure: Cervical five, Cervical six Laminectomy,foraminotomy with Cervical five-six Cervical six-seven Cervical seven-Thoracic one Posterior instrumented fusion;  Surgeon: Jadene Pierini, MD;  Location: [facility];  Service: Neurosurgery;  Laterality: N/A;   SHOULDER ARTHROSCOPY WITH SUBACROMIAL DECOMPRESSION, ROTATOR CUFF REPAIR AND BICEP TENDON REPAIR Left 05/28/2022   Procedure: SHOULDER ARTHROSCOPY WITH DEBRIDEMENT, DECOMPRESSION, ROTATOR CUFF REPAIR AND BICEP TENDON REPAIR;  Surgeon: Christena Flake, MD;  Location: [facility] ORS;  Service: Orthopedics;  Laterality: Left;   SHOULDER ARTHROSCOPY WITH SUBACROMIAL DECOMPRESSION, ROTATOR CUFF REPAIR AND BICEP TENDON REPAIR Left 12/03/2022   Procedure: SHOULDER Extensive ARTHROSCOPY WITH DEBRIDEMENT, removal of Retained Foreign Body;  Surgeon: Christena Flake, MD;  Location: [facility] ORS;  Service: Orthopedics;  Laterality: Left;   SHOULDER ARTHROSCOPY WITH SUBACROMIAL DECOMPRESSION, ROTATOR CUFF REPAIR AND BICEP TENDON REPAIR Right 03/11/2023   Procedure: RIGHT SHOULDER ARTHROSCOPY WITH DEBRIDMENT, DECOMPRESSION and BICEPS TENODESIS.;  Surgeon: Christena Flake, MD;  Location: [facility] ORS;  Service: Orthopedics;  Laterality: Right;   THORACIC DISCECTOMY N/A 07/10/2023   Procedure: T8-9 & T9-10 TRANSPEDICULAR DECOMPRESSION/DISCECTOMY;  Surgeon: Lovenia Kim, MD;  Location: [facility]  ORS;  Service: Neurosurgery;  Laterality: N/A;   TRANSFORAMINAL LUMBAR INTERBODY FUSION W/ MIS 1 LEVEL Right 05/23/2020   Procedure: Right Lumbar Five Sacral One Minimally invasive transforaminal lumbar interbody fusion;  Surgeon: Jadene Pierini, MD;  Location: [facility] OR;  Service: Neurosurgery;  Laterality: Right;  Right Lumbar Five Sacral One Minimally invasive transforaminal lumbar interbody fusion    Current Medications: Current Meds  Medication Sig   amLODipine (NORVASC) 5 MG tablet TAKE 1 TABLET BY MOUTH EVERYDAY AT BEDTIME    aspirin EC 81 MG tablet Take 81 mg by mouth in the morning. Swallow whole.   atorvastatin (LIPITOR) 40 MG tablet Take 1 tablet (40 mg total) by mouth every evening.   docusate sodium (COLACE) 100 MG capsule Take 100 mg by mouth in the morning.   gabapentin (NEURONTIN) 600 MG tablet Take 600 mg by mouth 4 (four) times daily.   isosorbide mononitrate (IMDUR) 30 MG 24 hr tablet Take 0.5 tablets (15 mg total) by mouth daily.   LINZESS 290 MCG CAPS capsule Take 290 mcg by mouth daily as needed (constipation.).   lisinopril (ZESTRIL) 20 MG tablet Take 1 tablet (20 mg total) by mouth daily.   methocarbamol (ROBAXIN) 750 MG tablet Take 1 tablet (750 mg total) by mouth every 6 (six) hours.   metoprolol tartrate (LOPRESSOR) 25 MG tablet TAKE ONE-HALF TABLET BY MOUTH  TWICE DAILY   mometasone (ELOCON) 0.1 % cream Apply 1 Application topically daily as needed (hand rash).   naloxone (NARCAN) 4 MG/0.1ML LIQD nasal spray kit Place 1 spray into the nose as needed (opioid overdose).   nitroGLYCERIN (NITROSTAT) 0.4 MG SL tablet Place 0.4 mg under the tongue every 5 (five) minutes as needed for chest pain.   omeprazole (PRILOSEC) 40 MG capsule Take 1 capsule (40 mg total) by mouth 2 (two) times daily.   ondansetron (ZOFRAN) 8 MG tablet Take 1 tablet (8 mg total) by mouth every 8 (eight) hours as needed for nausea or vomiting.   oxyCODONE (ROXICODONE) 15 MG immediate release tablet Take 15 mg by mouth every 4 (four) hours.   senna (SENOKOT) 8.6 MG TABS tablet Take 1 tablet (8.6 mg total) by mouth 2 (two) times daily as needed for mild constipation.   sucralfate (CARAFATE) 1 GM/10ML suspension TAKE 10 MLS (1 G TOTAL) BY MOUTH 4 (FOUR) TIMES DAILY.   Tiotropium Bromide Monohydrate (SPIRIVA RESPIMAT) 2.5 MCG/ACT AERS Take 1 puff by mouth in the morning and at bedtime. (Patient taking differently: Take 1 puff by mouth in the morning.)   umeclidinium bromide (INCRUSE ELLIPTA) 62.5 MCG/ACT AEPB Inhale 1 puff into the lungs  in the morning.   VENTOLIN HFA 108 (90 Base) MCG/ACT inhaler USE 1 TO 2 INHALATIONS BY MOUTH  INTO THE LUNGS EVERY 6 HOURS AS  NEEDED FOR SHORTNESS OF BREATH  OR WHEEZING   zolpidem (AMBIEN) 5 MG tablet Take 1 tablet (5 mg total) by mouth at bedtime as needed for sleep.     Allergies:   Cyclobenzaprine, Levofloxacin, Chlorpheniramine maleate, Phenergan [promethazine hcl], Phenylephrine hcl, Toradol [ketorolac tromethamine], Zoloft [sertraline hcl], Cephalexin, Ketorolac, and Tramadol   Social History   Socioeconomic History   Marital status: Widowed    Spouse name: Not on file   Number of children: 0   Years of education: Not on file   Highest education level: 9th grade  Occupational History   Occupation: disabled  Tobacco Use   Smoking status: Some Days    Current packs/day: 0.50  Average packs/day: 0.5 packs/day for 35.0 years (17.5 ttl pk-yrs)    Types: Cigarettes   Smokeless tobacco: Never  Vaping Use   Vaping status: Former   Quit date: 06/24/2013  Substance and Sexual Activity   Alcohol use: Not Currently    Alcohol/week: 0.0 standard drinks of alcohol   Drug use: Not Currently   Sexual activity: Yes    Birth control/protection: None  Other Topics Concern   Not on file  Social History Narrative   Moved from [place].   Social Drivers of Health   Financial Resource Strain: Low Risk  (08/19/2023)   Overall Financial Resource Strain (CARDIA)    Difficulty of Paying Living Expenses: Not hard at all  Food Insecurity: Patient Declined (07/10/2023)   Hunger Vital Sign    Worried About Running Out of Food in the Last Year: Patient declined    Ran Out of Food in the Last Year: Patient declined  Transportation Needs: Patient Declined (07/10/2023)   PRAPARE - Administrator, Civil Service (Medical): Patient declined    Lack of Transportation (Non-Medical): Patient declined  Physical Activity: Inactive (04/15/2023)   Exercise Vital Sign    Days of Exercise per  Week: 0 days    Minutes of Exercise per Session: 0 min  Stress: Stress Concern Present (08/15/2023)   [employer] of Occupational Health - Occupational Stress Questionnaire    Feeling of Stress : To some extent  Social Connections: Socially Isolated (08/15/2023)   Social Connection and Isolation Panel [NHANES]    Frequency of Communication with Friends and Family: More than three times a week    Frequency of Social Gatherings with Friends and Family: More than three times a week    Attends Religious Services: Never    Database administrator or Organizations: No    Attends Banker Meetings: Never    Marital Status: Widowed     Family History: The patient's family history includes Breast cancer in her maternal aunt; Diabetes in her maternal uncle; Healthy in her brother, brother, and brother; Heart disease in her mother; Lung cancer in her mother; Ovarian cancer in her mother.  ROS:   Please see the history of present illness.     All other systems reviewed and are negative.  EKGs/Labs/Other Studies Reviewed:    The following studies were reviewed today:   EKG Interpretation Date/Time:  Wednesday August 27 2023 09:52:51 EST Ventricular Rate:  61 PR Interval:  168 QRS Duration:  78 QT Interval:  414 QTC Calculation: 416 R Axis:   21  Text Interpretation: Normal sinus rhythm Cannot rule out Anterior infarct , age undetermined Confirmed by Debbe Odea ([id number]) on 08/27/2023 10:13:03 AM    Recent Labs: 01/06/2023: ALT 10; TSH 1.060 07/19/2023: BUN 16; Creatinine, Ser 0.83; Hemoglobin 7.7; Platelets 373; Potassium 3.9; Sodium 134  Recent Lipid Panel    Component Value Date/Time   CHOL 268 (H) 04/03/2022 1443   TRIG 268 (H) 04/03/2022 1443   HDL 37 (L) 04/03/2022 1443   CHOLHDL 7.2 (H) 04/03/2022 1443   CHOLHDL 5.3 08/02/2015 0606   VLDL 24 08/02/2015 0606   LDLCALC 179 (H) 04/03/2022 1443     Risk Assessment/Calculations:         Physical Exam:     VS:  BP 128/64   Pulse 61   Ht 5\' 4"  (1.626 m)   Wt 180 lb 6.4 oz (81.8 kg)   LMP 12/16/2017 (Approximate)   SpO2 96%  BMI 30.97 kg/m     Wt Readings from Last 3 Encounters:  08/27/23 180 lb 6.4 oz (81.8 kg)  08/25/23 163 lb (73.9 kg)  08/19/23 163 lb (73.9 kg)     GEN:  Well nourished, well developed in no acute distress HEENT: Normal NECK: No JVD; No carotid bruits CARDIAC: RRR, no murmurs, rubs, gallops RESPIRATORY: Diminished breath sounds, rhonchi noted ABDOMEN: Soft, non-tender, non-distended MUSCULOSKELETAL:  No edema; No deformity  SKIN: Warm and dry NEUROLOGIC:  Alert and oriented x 3 PSYCHIATRIC:  Normal affect   ASSESSMENT:    1. Coronary artery disease involving native heart, unspecified vessel or lesion type, unspecified whether angina present   2. Mixed hyperlipidemia   3. Primary hypertension   4. Smoking    PLAN:    In order of problems listed above:  Mild to nonobstructive CAD (LM, LAD, LCx), cont Lipitor 40 mg daily, continue aspirin 81.  Echo with EF 60 to 65%.  Occasional chest pain, start Imdur 15 mg daily Hyperlipidemia, continue Lipitor 40 mg as above.  Hypertension, BP controlled, continue lisinopril 20 mg daily, Lopressor 25 mg twice daily, Norvasc 5 mg daily. Current smoker, smoking cessation advised.  Follow-up in 3 months.      Medication Adjustments/Labs and Tests Ordered: Current medicines are reviewed at length with the patient today.  Concerns regarding medicines are outlined above.  Orders Placed This Encounter  Procedures   EKG 12-Lead   Meds ordered this encounter  Medications   isosorbide mononitrate (IMDUR) 30 MG 24 hr tablet    Sig: Take 0.5 tablets (15 mg total) by mouth daily.    Dispense:  45 tablet    Refill:  3    Patient Instructions  Medication Instructions:   START TAKING: Imdur 15 mg daily  *If you need a refill on your cardiac medications before your next appointment, please call your  pharmacy*   Lab Work: None ordered at this time   Follow-Up: At [facility], you and your health needs are our priority.  As part of our continuing mission to provide you with exceptional heart care, we have created designated Provider Care Teams.  These Care Teams include your primary Cardiologist (physician) and Advanced Practice Providers (APPs -  Physician Assistants and Nurse Practitioners) who all work together to provide you with the care you need, when you need it.   Your next appointment:   3 month(s)  Provider:   You may see Debbe Odea, MD or one of the following Advanced Practice Providers on your designated Care Team:   Nicolasa Ducking, NP Eula Listen, [name] Cadence Fransico Michael, [name] Charlsie Quest, NP Carlos Levering, NP       Signed, Debbe Odea, MD  08/27/2023 10:52 AM    [facility]

## 2023-08-27 NOTE — Patient Instructions (Signed)
 Medication Instructions:   START TAKING: Imdur 15 mg daily  *If you need a refill on your cardiac medications before your next appointment, please call your pharmacy*   Lab Work: None ordered at this time   Follow-Up: At [facility], you and your health needs are our priority.  As part of our continuing mission to provide you with exceptional heart care, we have created designated Provider Care Teams.  These Care Teams include your primary Cardiologist (physician) and Advanced Practice Providers (APPs -  Physician Assistants and Nurse Practitioners) who all work together to provide you with the care you need, when you need it.   Your next appointment:   3 month(s)  Provider:   You may see Debbe Odea, MD or one of the following Advanced Practice Providers on your designated Care Team:   Nicolasa Ducking, NP Eula Listen, [name] Cadence Fransico Michael, [name] Charlsie Quest, NP Carlos Levering, NP

## 2023-08-29 DIAGNOSIS — Z981 Arthrodesis status: Secondary | ICD-10-CM | POA: Diagnosis not present

## 2023-08-29 DIAGNOSIS — M5114 Intervertebral disc disorders with radiculopathy, thoracic region: Secondary | ICD-10-CM | POA: Diagnosis not present

## 2023-08-29 DIAGNOSIS — J449 Chronic obstructive pulmonary disease, unspecified: Secondary | ICD-10-CM | POA: Diagnosis not present

## 2023-08-29 DIAGNOSIS — M4804 Spinal stenosis, thoracic region: Secondary | ICD-10-CM | POA: Diagnosis not present

## 2023-08-29 DIAGNOSIS — Z4789 Encounter for other orthopedic aftercare: Secondary | ICD-10-CM | POA: Diagnosis not present

## 2023-08-29 DIAGNOSIS — G894 Chronic pain syndrome: Secondary | ICD-10-CM | POA: Diagnosis not present

## 2023-08-29 DIAGNOSIS — M5104 Intervertebral disc disorders with myelopathy, thoracic region: Secondary | ICD-10-CM | POA: Diagnosis not present

## 2023-08-29 DIAGNOSIS — M4726 Other spondylosis with radiculopathy, lumbar region: Secondary | ICD-10-CM | POA: Diagnosis not present

## 2023-09-03 DIAGNOSIS — Z981 Arthrodesis status: Secondary | ICD-10-CM | POA: Diagnosis not present

## 2023-09-03 DIAGNOSIS — J449 Chronic obstructive pulmonary disease, unspecified: Secondary | ICD-10-CM | POA: Diagnosis not present

## 2023-09-03 DIAGNOSIS — M5104 Intervertebral disc disorders with myelopathy, thoracic region: Secondary | ICD-10-CM | POA: Diagnosis not present

## 2023-09-03 DIAGNOSIS — M5114 Intervertebral disc disorders with radiculopathy, thoracic region: Secondary | ICD-10-CM | POA: Diagnosis not present

## 2023-09-03 DIAGNOSIS — M4804 Spinal stenosis, thoracic region: Secondary | ICD-10-CM | POA: Diagnosis not present

## 2023-09-03 DIAGNOSIS — M4726 Other spondylosis with radiculopathy, lumbar region: Secondary | ICD-10-CM | POA: Diagnosis not present

## 2023-09-03 DIAGNOSIS — Z4789 Encounter for other orthopedic aftercare: Secondary | ICD-10-CM | POA: Diagnosis not present

## 2023-09-03 DIAGNOSIS — G894 Chronic pain syndrome: Secondary | ICD-10-CM | POA: Diagnosis not present

## 2023-09-05 ENCOUNTER — Telehealth: Payer: Self-pay | Admitting: Family Medicine

## 2023-09-05 DIAGNOSIS — G894 Chronic pain syndrome: Secondary | ICD-10-CM | POA: Diagnosis not present

## 2023-09-05 DIAGNOSIS — I10 Essential (primary) hypertension: Secondary | ICD-10-CM

## 2023-09-05 DIAGNOSIS — M25562 Pain in left knee: Secondary | ICD-10-CM | POA: Diagnosis not present

## 2023-09-05 DIAGNOSIS — Z79891 Long term (current) use of opiate analgesic: Secondary | ICD-10-CM | POA: Diagnosis not present

## 2023-09-05 DIAGNOSIS — M797 Fibromyalgia: Secondary | ICD-10-CM | POA: Diagnosis not present

## 2023-09-05 DIAGNOSIS — M545 Low back pain, unspecified: Secondary | ICD-10-CM | POA: Diagnosis not present

## 2023-09-05 NOTE — Telephone Encounter (Signed)
 [facility] pharmacy is requesting refill lisinopril (ZESTRIL) 20 MG tablet   Please advise

## 2023-09-05 NOTE — Telephone Encounter (Signed)
 Please see other encounter. Patient was called and stated for medication to go to [facility] not [facility]

## 2023-09-09 ENCOUNTER — Ambulatory Visit: Payer: Self-pay | Admitting: Family Medicine

## 2023-09-09 NOTE — Telephone Encounter (Signed)
  Chief Complaint: fatigue Symptoms: fatigue, nausea, SOB, dizziness Frequency: for 1 week Pertinent Negatives: Patient denies chest pain Disposition: [x] ED /[] Urgent Care (no appt availability in office) / [] Appointment(In office/virtual)/ []  [facility]/ [] Home Care/ [] Refused Recommended Disposition /[] [facility]/ []  Follow-up with PCP Additional Notes: Patient states that for the last week she has been fatigue but the last couple of days it has gotten worse.  She is fatigue with nausea and dizziness and SOB with execration and at times even when she is at rest.  Patient states that the last time she felt like this she has a severe infection in her blood stream. She does have a cough but states that is normal with her copd.  Patient states that her boyfriend is there an he can take her to ED   Copied from CRM [id number]. Topic: Appointments - Appointment Scheduling >> Sep 09, 2023  1:12 PM Emylou G wrote: Pls call patient back at [phone] feeling no energy, dizzy, nauseous.. not sure if needs to go to UC Reason for Disposition  Patient sounds very sick or weak to the triager  Answer Assessment - Initial Assessment Questions 1. DESCRIPTION: "Describe how you are feeling."    Just feeling week and fatigue  2. SEVERITY: "How bad is it?"  "Can you stand and walk?"   - MILD (0-3): Feels weak or tired, but does not interfere with work, school or normal activities.   - MODERATE (4-7): Able to stand and walk; weakness interferes with work, school, or normal activities.   - SEVERE (8-10): Unable to stand or walk; unable to do usual activities.     Mod to severe 3. ONSET: "When did these symptoms begin?" (e.g., hours, days, weeks, months)     About a week ago 4. CAUSE: "What do you think is causing the weakness or fatigue?" (e.g., not drinking enough fluids, medical problem, trouble sleeping)     unknown 5. NEW MEDICINES:  "Have you started on any new medicines recently?"  (e.g., opioid pain medicines, benzodiazepines, muscle relaxants, antidepressants, antihistamines, neuroleptics, beta blockers)     no 6. OTHER SYMPTOMS: "Do you have any other symptoms?" (e.g., chest pain, fever, cough, SOB, vomiting, diarrhea, bleeding, other areas of pain)     Dizziness, sob with execration, cough, nauseous,  Protocols used: Weakness (Generalized) and Fatigue-A-AH

## 2023-09-10 ENCOUNTER — Encounter: Payer: Self-pay | Admitting: Neurosurgery

## 2023-09-10 DIAGNOSIS — Z4789 Encounter for other orthopedic aftercare: Secondary | ICD-10-CM | POA: Diagnosis not present

## 2023-09-10 DIAGNOSIS — Z981 Arthrodesis status: Secondary | ICD-10-CM | POA: Diagnosis not present

## 2023-09-10 DIAGNOSIS — M5114 Intervertebral disc disorders with radiculopathy, thoracic region: Secondary | ICD-10-CM | POA: Diagnosis not present

## 2023-09-10 DIAGNOSIS — G894 Chronic pain syndrome: Secondary | ICD-10-CM | POA: Diagnosis not present

## 2023-09-10 DIAGNOSIS — M4804 Spinal stenosis, thoracic region: Secondary | ICD-10-CM | POA: Diagnosis not present

## 2023-09-10 DIAGNOSIS — M4726 Other spondylosis with radiculopathy, lumbar region: Secondary | ICD-10-CM | POA: Diagnosis not present

## 2023-09-10 DIAGNOSIS — M5104 Intervertebral disc disorders with myelopathy, thoracic region: Secondary | ICD-10-CM | POA: Diagnosis not present

## 2023-09-10 DIAGNOSIS — J449 Chronic obstructive pulmonary disease, unspecified: Secondary | ICD-10-CM | POA: Diagnosis not present

## 2023-09-11 ENCOUNTER — Other Ambulatory Visit: Payer: Self-pay

## 2023-09-11 DIAGNOSIS — E785 Hyperlipidemia, unspecified: Secondary | ICD-10-CM

## 2023-09-11 MED ORDER — ATORVASTATIN CALCIUM 40 MG PO TABS: 40.0000 mg | ORAL_TABLET | Freq: Every evening | ORAL | 0 refills | Status: DC

## 2023-09-12 ENCOUNTER — Emergency Department

## 2023-09-12 ENCOUNTER — Inpatient Hospital Stay
Admission: EM | Admit: 2023-09-12 | Discharge: 2023-09-16 | DRG: 812 | Disposition: A | Discharge status: Home / Self Care | Attending: Student | Admitting: Student

## 2023-09-12 ENCOUNTER — Other Ambulatory Visit: Payer: Self-pay | Admitting: Obstetrics and Gynecology

## 2023-09-12 ENCOUNTER — Other Ambulatory Visit: Payer: Self-pay

## 2023-09-12 DIAGNOSIS — Z7982 Long term (current) use of aspirin: Secondary | ICD-10-CM

## 2023-09-12 DIAGNOSIS — E669 Obesity, unspecified: Secondary | ICD-10-CM | POA: Diagnosis present

## 2023-09-12 DIAGNOSIS — Z886 Allergy status to analgesic agent status: Secondary | ICD-10-CM

## 2023-09-12 DIAGNOSIS — I2510 Atherosclerotic heart disease of native coronary artery without angina pectoris: Secondary | ICD-10-CM | POA: Diagnosis present

## 2023-09-12 DIAGNOSIS — G629 Polyneuropathy, unspecified: Secondary | ICD-10-CM | POA: Diagnosis present

## 2023-09-12 DIAGNOSIS — I3481 Nonrheumatic mitral (valve) annulus calcification: Secondary | ICD-10-CM | POA: Diagnosis not present

## 2023-09-12 DIAGNOSIS — Z1152 Encounter for screening for COVID-19: Secondary | ICD-10-CM

## 2023-09-12 DIAGNOSIS — Z79899 Other long term (current) drug therapy: Secondary | ICD-10-CM

## 2023-09-12 DIAGNOSIS — Z6831 Body mass index (BMI) 31.0-31.9, adult: Secondary | ICD-10-CM

## 2023-09-12 DIAGNOSIS — G894 Chronic pain syndrome: Secondary | ICD-10-CM | POA: Diagnosis present

## 2023-09-12 DIAGNOSIS — K640 First degree hemorrhoids: Secondary | ICD-10-CM | POA: Diagnosis present

## 2023-09-12 DIAGNOSIS — D638 Anemia in other chronic diseases classified elsewhere: Secondary | ICD-10-CM | POA: Diagnosis present

## 2023-09-12 DIAGNOSIS — I739 Peripheral vascular disease, unspecified: Secondary | ICD-10-CM | POA: Diagnosis present

## 2023-09-12 DIAGNOSIS — I10 Essential (primary) hypertension: Secondary | ICD-10-CM | POA: Diagnosis present

## 2023-09-12 DIAGNOSIS — Z888 Allergy status to other drugs, medicaments and biological substances status: Secondary | ICD-10-CM

## 2023-09-12 DIAGNOSIS — J441 Chronic obstructive pulmonary disease with (acute) exacerbation: Secondary | ICD-10-CM | POA: Diagnosis present

## 2023-09-12 DIAGNOSIS — Z8249 Family history of ischemic heart disease and other diseases of the circulatory system: Secondary | ICD-10-CM | POA: Diagnosis not present

## 2023-09-12 DIAGNOSIS — Z887 Allergy status to serum and vaccine status: Secondary | ICD-10-CM

## 2023-09-12 DIAGNOSIS — K219 Gastro-esophageal reflux disease without esophagitis: Secondary | ICD-10-CM | POA: Diagnosis present

## 2023-09-12 DIAGNOSIS — E785 Hyperlipidemia, unspecified: Secondary | ICD-10-CM | POA: Diagnosis not present

## 2023-09-12 DIAGNOSIS — Z79891 Long term (current) use of opiate analgesic: Secondary | ICD-10-CM | POA: Diagnosis not present

## 2023-09-12 DIAGNOSIS — Z885 Allergy status to narcotic agent status: Secondary | ICD-10-CM | POA: Diagnosis not present

## 2023-09-12 DIAGNOSIS — I700 Atherosclerosis of aorta: Secondary | ICD-10-CM | POA: Diagnosis present

## 2023-09-12 DIAGNOSIS — J449 Chronic obstructive pulmonary disease, unspecified: Secondary | ICD-10-CM | POA: Diagnosis not present

## 2023-09-12 DIAGNOSIS — R059 Cough, unspecified: Secondary | ICD-10-CM | POA: Diagnosis not present

## 2023-09-12 DIAGNOSIS — D509 Iron deficiency anemia, unspecified: Secondary | ICD-10-CM | POA: Diagnosis present

## 2023-09-12 DIAGNOSIS — E876 Hypokalemia: Secondary | ICD-10-CM | POA: Diagnosis present

## 2023-09-12 DIAGNOSIS — R0602 Shortness of breath: Principal | ICD-10-CM

## 2023-09-12 DIAGNOSIS — R058 Other specified cough: Secondary | ICD-10-CM | POA: Diagnosis not present

## 2023-09-12 DIAGNOSIS — R918 Other nonspecific abnormal finding of lung field: Secondary | ICD-10-CM | POA: Diagnosis not present

## 2023-09-12 DIAGNOSIS — F17210 Nicotine dependence, cigarettes, uncomplicated: Secondary | ICD-10-CM | POA: Diagnosis present

## 2023-09-12 DIAGNOSIS — Z8673 Personal history of transient ischemic attack (TIA), and cerebral infarction without residual deficits: Secondary | ICD-10-CM

## 2023-09-12 DIAGNOSIS — E7800 Pure hypercholesterolemia, unspecified: Secondary | ICD-10-CM | POA: Diagnosis present

## 2023-09-12 DIAGNOSIS — D649 Anemia, unspecified: Secondary | ICD-10-CM | POA: Diagnosis present

## 2023-09-12 DIAGNOSIS — R0902 Hypoxemia: Secondary | ICD-10-CM | POA: Diagnosis not present

## 2023-09-12 DIAGNOSIS — J90 Pleural effusion, not elsewhere classified: Secondary | ICD-10-CM | POA: Diagnosis not present

## 2023-09-12 DIAGNOSIS — R531 Weakness: Secondary | ICD-10-CM | POA: Diagnosis not present

## 2023-09-12 DIAGNOSIS — Z882 Allergy status to sulfonamides status: Secondary | ICD-10-CM

## 2023-09-12 DIAGNOSIS — Z881 Allergy status to other antibiotic agents status: Secondary | ICD-10-CM

## 2023-09-12 DIAGNOSIS — K921 Melena: Secondary | ICD-10-CM | POA: Diagnosis not present

## 2023-09-12 DIAGNOSIS — R0689 Other abnormalities of breathing: Secondary | ICD-10-CM | POA: Diagnosis not present

## 2023-09-12 LAB — BASIC METABOLIC PANEL
Anion gap: 9 (ref 5–15)
BUN: 21 mg/dL — ABNORMAL HIGH (ref 6–20)
CO2: 24 mmol/L (ref 22–32)
Calcium: 8.7 mg/dL — ABNORMAL LOW (ref 8.9–10.3)
Chloride: 102 mmol/L (ref 98–111)
Creatinine, Ser: 1.09 mg/dL — ABNORMAL HIGH (ref 0.44–1.00)
GFR, Estimated: 60 mL/min — ABNORMAL LOW (ref >60)
Glucose, Bld: 71 mg/dL (ref 70–99)
Potassium: 3.8 mmol/L (ref 3.5–5.1)
Sodium: 135 mmol/L (ref 135–145)

## 2023-09-12 LAB — CBC
HCT: 20.7 % — ABNORMAL LOW (ref 36.0–46.0)
Hemoglobin: 5.8 g/dL — ABNORMAL LOW (ref 12.0–15.0)
MCH: 18.7 pg — ABNORMAL LOW (ref 26.0–34.0)
MCHC: 28 g/dL — ABNORMAL LOW (ref 30.0–36.0)
MCV: 66.8 fL — ABNORMAL LOW (ref 80.0–100.0)
Platelets: 174 10*3/uL (ref 150–400)
RBC: 3.1 MIL/uL — ABNORMAL LOW (ref 3.87–5.11)
RDW: 19.9 % — ABNORMAL HIGH (ref 11.5–15.5)
WBC: 7.1 10*3/uL (ref 4.0–10.5)
nRBC: 0.7 % — ABNORMAL HIGH (ref 0.0–0.2)

## 2023-09-12 LAB — FERRITIN: Ferritin: 6 ng/mL — ABNORMAL LOW (ref 11–307)

## 2023-09-12 LAB — IRON AND TIBC
Iron: 21 ug/dL — ABNORMAL LOW (ref 28–170)
Saturation Ratios: 4 % — ABNORMAL LOW (ref 10.4–31.8)
TIBC: 504 ug/dL — ABNORMAL HIGH (ref 250–450)
UIBC: 483 ug/dL

## 2023-09-12 LAB — RESP PANEL BY RT-PCR (RSV, FLU A&B, COVID)  RVPGX2
Influenza A by PCR: NEGATIVE
Influenza B by PCR: NEGATIVE
Resp Syncytial Virus by PCR: NEGATIVE
SARS Coronavirus 2 by RT PCR: NEGATIVE

## 2023-09-12 LAB — TROPONIN I (HIGH SENSITIVITY): Troponin I (High Sensitivity): 5 ng/L (ref <18)

## 2023-09-12 LAB — PREPARE RBC (CROSSMATCH)

## 2023-09-12 LAB — LACTIC ACID, PLASMA: Lactic Acid, Venous: 1.1 mmol/L (ref 0.5–1.9)

## 2023-09-12 MED ORDER — IOHEXOL 350 MG/ML SOLN
75.0000 mL | Freq: Once | INTRAVENOUS | Status: AC | PRN
  Administered 2023-09-12: 75 mL via INTRAVENOUS

## 2023-09-12 MED ORDER — SODIUM CHLORIDE 0.9 % IV SOLN
10.0000 mL/h | Freq: Once | INTRAVENOUS | Status: AC
  Administered 2023-09-12: 10 mL/h via INTRAVENOUS

## 2023-09-12 NOTE — Patient Outreach (Signed)
 Medicaid Managed Care   Nurse Care Manager Note  09/12/2023 Name:  Sara Munoz MRN:  [id number] DOB:  12/19/1965  Sara Munoz is an 58 y.o. year old female who is a primary patient of [name], Tawanna Cooler, MD.  The Medicaid Managed Care Coordination team was consulted for assistance with:    Chronic healthcare management needs, HTN, chronic pain, tobacco use, GERD, CAD, COPD, OSA, anxiety, osteoarthritis, HLD, PAD, PVD  Sara Munoz was given information about Medicaid Managed Care Coordination team services today. Sara Munoz Patient agreed to services and verbal consent obtained.  Engaged with patient by telephone for follow up visit in response to provider referral for case management and/or care coordination services.   Patient is participating in a Managed Medicaid Plan:  Yes  Assessments/Interventions:  Review of past medical history, allergies, medications, health status, including review of consultants reports, laboratory and other test data, was performed as part of comprehensive evaluation and provision of chronic care management services.  SDOH (Social Drivers of Health) assessments and interventions performed: SDOH Interventions    Flowsheet Row Patient Outreach Telephone from 09/12/2023 in [place] POPULATION HEALTH DEPARTMENT Patient Outreach Telephone from 08/15/2023 in [place] POPULATION HEALTH DEPARTMENT Patient Outreach Telephone from 07/17/2023 in [place] POPULATION HEALTH DEPARTMENT Patient Outreach Telephone from 06/16/2023 in [place] POPULATION HEALTH DEPARTMENT Patient Outreach Telephone from 05/16/2023 in [place] POPULATION HEALTH DEPARTMENT Patient Outreach Telephone from 04/15/2023 in  POPULATION HEALTH DEPARTMENT  SDOH Interventions        Food Insecurity Interventions Other (Comment)  [patient utilizes food resources in her community, Holiday representative, etc.] -- -- -- Intervention Not Indicated --  Utilities Interventions -- -- --  Intervention Not Indicated -- --  Alcohol Usage Interventions -- -- -- Intervention Not Indicated (Score <7) -- --  Financial Strain Interventions -- -- Intervention Not Indicated -- -- --  Physical Activity Interventions Intervention Not Indicated, Other (Comments)  [unable to perform this type of exercise] -- -- -- -- Intervention Not Indicated, Other (Comments)  [not able to engage in this type of exercixe]  Stress Interventions -- Other (Comment)  [sees therapist] -- -- -- --  Social Connections Interventions -- Intervention Not Indicated -- -- -- Intervention Not Indicated  Health Literacy Interventions -- -- Intervention Not Indicated -- -- --     Care Plan Allergies  Allergen Reactions   Cyclobenzaprine Hives, Swelling and Rash    Facial/lip swelling      Levofloxacin Hives, Swelling and Rash   Chlorpheniramine Maleate     Other reaction(s): Unknown   Phenergan [Promethazine Hcl] Other (See Comments)    Agitation.    Phenylephrine Hcl Other (See Comments)    unknown   Toradol [Ketorolac Tromethamine] Swelling and Other (See Comments)    Facial/tongue swelling    Zoloft [Sertraline Hcl] Swelling    Tongue swelling      Cephalexin Hives and Rash   Ketorolac Rash   Tramadol Hives, Swelling, Other (See Comments) and Rash    Lip swelling     Medications Reviewed Today     Reviewed by [name], RN (Registered Nurse) on 09/12/23 at 1320  Med List Status: <None>   Medication Order Taking? Sig Documenting Provider Last Dose Status Informant  amLODipine (NORVASC) 5 MG tablet [id number] No TAKE 1 TABLET BY MOUTH EVERYDAY AT BEDTIME [name], [name], MD Taking Active   aspirin EC 81 MG tablet [id number] No Take 81 mg by mouth in the morning. Swallow  whole. [provider] Taking Active Self  atorvastatin (LIPITOR) 40 MG tablet [id number]  Take 1 tablet (40 mg total) by mouth every evening. Debbe Odea, MD  Active   docusate sodium (COLACE) 100 MG  capsule [id number] No Take 100 mg by mouth in the morning. [provider] Taking Active Self  gabapentin (NEURONTIN) 600 MG tablet [id number] No Take 600 mg by mouth 4 (four) times daily. [provider] Taking Active Self  isosorbide mononitrate (IMDUR) 30 MG 24 hr tablet [id number]  Take 0.5 tablets (15 mg total) by mouth daily. Debbe Odea, MD  Active   LINZESS 290 MCG CAPS capsule [id number] No Take 290 mcg by mouth daily as needed (constipation.). [provider] Taking Active Self  lisinopril (ZESTRIL) 20 MG tablet [id number] No Take 1 tablet (20 mg total) by mouth daily. [name], [name], MD Taking Active   methocarbamol (ROBAXIN) 750 MG tablet [id number] No Take 1 tablet (750 mg total) by mouth every 6 (six) hours. Susanne Borders, PA Taking Active   metoprolol tartrate (LOPRESSOR) 25 MG tablet [id number] No TAKE ONE-HALF TABLET BY MOUTH  TWICE DAILY [name], [name], MD Taking Active   mometasone (ELOCON) 0.1 % cream [id number] No Apply 1 Application topically daily as needed (hand rash). Poggi, Excell Seltzer, MD Taking Active Self  naloxone [facility]) 4 MG/0.1ML LIQD nasal spray kit [id number] No Place 1 spray into the nose as needed (opioid overdose). [provider] Taking Active Self           Med Note Richardean Canal May 03, 2021 11:23 AM)    nitroGLYCERIN (NITROSTAT) 0.4 MG SL tablet [id number] No Place 0.4 mg under the tongue every 5 (five) minutes as needed for chest pain. [provider] Taking Active Self  omeprazole (PRILOSEC) 40 MG capsule [id number] No Take 1 capsule (40 mg total) by mouth 2 (two) times daily. [name], MD Taking Active Self  ondansetron [facility]) 8 MG tablet [id number] No Take 1 tablet (8 mg total) by mouth every 8 (eight) hours as needed for nausea or vomiting. [name], [name], MD Taking Active   oxyCODONE (ROXICODONE) 15 MG immediate release tablet [id number] No Take 15 mg by mouth every 4  (four) hours. [provider] Taking Active   senna (SENOKOT) 8.6 MG TABS tablet [id number] No Take 1 tablet (8.6 mg total) by mouth 2 (two) times daily as needed for mild constipation. Susanne Borders, PA Taking Active   sucralfate (CARAFATE) 1 GM/10ML suspension [id number] No TAKE 10 MLS (1 G TOTAL) BY MOUTH 4 (FOUR) TIMES DAILY. [name], MD Taking Active   Tiotropium Bromide Monohydrate (SPIRIVA RESPIMAT) 2.5 MCG/ACT AERS [id number] No Take 1 puff by mouth in the morning and at bedtime.  Patient taking differently: Take 1 puff by mouth in the morning.   Jacky Kindle, FNP Taking Active Self  umeclidinium bromide (INCRUSE ELLIPTA) 62.5 MCG/ACT AEPB [id number] No Inhale 1 puff into the lungs in the morning. [provider] Taking Active Self  VENTOLIN HFA 108 (90 Base) MCG/ACT inhaler [id number] No USE 1 TO 2 INHALATIONS BY MOUTH  INTO THE LUNGS EVERY 6 HOURS AS  NEEDED FOR SHORTNESS OF BREATH  OR WHEEZING [name], [name], MD Taking Active Self  zolpidem (AMBIEN) 5 MG tablet [id number] No Take 1 tablet (5 mg total) by mouth at bedtime as needed for sleep. 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   History of lumbar fusion 03/18/2023   S/P cervical spinal fusion 03/18/2023   Tendinitis of upper biceps tendon of right shoulder 01/17/2023   Rotator cuff tendinitis, right 01/17/2023   Chronic fatigue 01/06/2023   Prediabetes 01/06/2023   Status post left rotator cuff repair 12/04/2022   Foreign body left in shoulder 12/04/2022   Atopic dermatitis of both hands 09/12/2022    Headache disorder 08/13/2022   Nontraumatic incomplete tear of left rotator cuff 05/28/2022   Spondylolisthesis 04/19/2022   Encounter for annual physical examination excluding gynecological examination in a patient older than 17 years 04/03/2022   Essential hypertension 03/11/2022   Lumbar spondylosis 02/12/2022   Chronic migraine w/o aura w/o status migrainosus, not intractable 07/09/2021   Obstructive sleep apnea 07/09/2021   Body mass index (BMI) 32.0-32.9, adult 06/07/2021   Pseudoarthrosis of cervical spine (HCC) 05/14/2021   Lumbar radiculopathy 05/23/2020   Sacroiliac inflammation (HCC) 11/25/2019   Sepsis (HCC) 10/19/2018   Severe sepsis (HCC) 10/19/2018   Cervical radiculopathy 02/12/2018   Chronic migraine 02/12/2018   Paresthesia 08/29/2017   Neck pain 08/29/2017   Daily headache 08/29/2017   Chronic active hepatitis (HCC) 07/29/2017   Neurogenic pain 07/31/2016   Chronic pain syndrome 06/05/2016   Carotid artery stenosis 02/27/2016   Chronic constipation 02/27/2016   Chronic nausea 02/27/2016   Hypercholesterolemia 02/27/2016   Osteoarthritis 02/27/2016   PTSD (post-traumatic stress disorder) 02/27/2016   TIA (transient ischemic attack) 02/27/2016   Long term current use of opiate analgesic 02/27/2016   Long term prescription opiate use 02/27/2016   Encounter for pain management consult 02/27/2016   Chronic hip pain (Location of Tertiary source of pain) (Bilateral) (L>R) 02/27/2016   Chronic knee pain (Bilateral) (L>R) 02/27/2016   Chronic shoulder pain (Bilateral) (L>R) 02/27/2016   Chronic sacroiliac joint pain (Bilateral) (L>R) 02/27/2016   Chronic low back pain (Location of Primary Source of Pain) (Bilateral) (L>R) 02/27/2016   Chronic lower extremity pain (Location of Secondary source of pain) (Bilateral) (L>R) 02/27/2016   Osteoarthritis of hip (Bilateral) (L>R) 02/27/2016   Chronic neck pain (posterior midline) (Bilateral) (L>R) 02/27/2016   Cervicogenic  headache (Bilateral) (L>R) 02/27/2016   Occipital headache (Bilateral) (L>R) 02/27/2016   Chronic upper extremity pain (Bilateral) (L>R) 02/27/2016   Chronic Cervical radicular pain (Bilateral) (L>R) 02/27/2016   Chronic lumbar radicular pain (Right) (L5 dermatome) 02/27/2016   Lumbar facet syndrome (Bilateral) (L>R) 02/27/2016   Long term prescription benzodiazepine use 02/27/2016   Hypertension    Chronic obstructive pulmonary disease (HCC) 10/09/2015   GERD (gastroesophageal reflux disease) 10/09/2015   Non-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/2014 08/02/2015   Hyperlipidemia 08/02/2015   Costochondritis 08/02/2015   Drug abuse, 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   GAD (generalized anxiety disorder) 09/03/2014   Narcotic dependence (HCC) 09/03/2014   PVD (peripheral vascular disease) (HCC) 09/03/2014   Smoker 09/03/2014   Cigarette nicotine dependence with nicotine-induced disorder 07/08/2014   Anemia of chronic disease 03/19/2014   Polysubstance abuse (HCC) 03/19/2014   MSSA (methicillin susceptible Staphylococcus aureus) septicemia (HCC) 03/07/2014   Hypomagnesemia 01/04/2014   Chronic cough 09/04/2011   Sleep apnea, obstructive 09/04/2011   Sinusitis, acute 09/04/2011   Conditions to  be addressed/monitored per PCP order:  Chronic healthcare management needs, HTN, chronic pain, tobacco use, GERD, CAD, COPD, OSA, anxiety, osteoarthritis, HLD, PAD, PVD  Care Plan : RN Care Manager Plan of Care  Updates made by [name], RN since 09/12/2023 12:00 AM     Problem: Health Promotion or Disease Self-Management (General Plan of Care)      Long-Range Goal: Chronic Disease Management   Start Date: 09/12/2022  Expected End Date: 09/14/2023  Priority: High  Note:   Current Barriers:  Knowledge Deficits related to plan of care for management of GERD, CAD, HTN, PVD, TIA, migranes, COPD, OSA, tobacco use, osteoarthritis, chronic pain, anxiety  Chronic Disease Management support and education needs  related to  GERD, CAD, HTN, PVD, TIA, migranes, COPD, OSA, tobacco use, osteoarthritis, chronic pain, anxiety  09/12/23: Completed PT/OT.  Smokes 5-8 cigarettes a day.  Some SOB-has PULM appt 3/24.  Seen by CARDS 3/5.  Ongoing post op pain-has f/u appt with Neurosurgeon scheduled.   RNCM Clinical Goal(s):  Patient will verbalize understanding of plan for management of GERD, CAD, HTN, PVD, TIA, migranes, COPD, OSA, tobacco use, osteoarthritis, chronic pain, anxiety   as evidenced by patient report verbalize basic understanding of  GERD, CAD, HTN, PVD, TIA, migranes, COPD, OSA, tobacco use, osteoarthritis, chronic pain, anxiety  disease process and self health management plan as evidenced by patient report take all medications exactly as prescribed and will call provider for medication related questions as evidenced by patient report  demonstrate understanding of rationale for each prescribed medication as evidenced by patient report attend all scheduled medical appointments as evidenced by patient report and EMR review demonstrate Ongoing adherence to prescribed treatment plan for  GERD, CAD, HTN, PVD, TIA, migranes, COPD, OSA, tobacco use, osteoarthritis, chronic pain, anxiety as evidenced by patient report continue to work with RN Care Manager to address care management and care coordination needs related to GERD, CAD, HTN, PVD, TIA, migranes, COPD, OSA, tobacco use, osteoarthritis, chronic pain, anxiety  as evidenced by adherence to CM Team Scheduled appointments through collaboration with RN Care manager, provider, and care team.   Interventions: Inter-disciplinary care team collaboration (see longitudinal plan of care) Evaluation of current treatment plan related to  self management and patient's adherence to plan as established by provider 01/03/23-discussed protein drink supplementation, MVI  CAD Interventions: (Status:  New goal.) Long Term Goal Assessed understanding of CAD  diagnosis Medications reviewed including medications utilized in CAD treatment plan Counseled on importance of regular laboratory monitoring as prescribed Reviewed Importance of taking all medications as prescribed Reviewed Importance of attending all scheduled provider appointments Advised to report any changes in symptoms or exercise tolerance Screening for signs and symptoms of depression related to chronic disease state Assessed social determinant of health barriers  COPD Interventions:  (Status:  New goal.) Long Term Goal Advised patient to self assesses COPD action plan zone and make appointment with provider if in the yellow zone for 48 hours without improvement Advised patient to engage in light exercise as tolerated 3-5 days a week to aid in the the management of COPD Provided education about and advised patient to utilize infection prevention strategies to reduce risk of respiratory infection Discussed the importance of adequate rest and management of fatigue with COPD Assessed social determinant of health barriers  Hypertension Interventions:  (Status:  New goal.) Long Term Goal Last practice recorded BP readings:  BP Readings from Last 3 Encounters:        08/15/23  120/70  08/27/23         128/64 07/17/23         128/65  Most recent eGFR/CrCl:  Lab Results  Component Value Date   EGFR 83 04/08/2022    No components found for: "CRCL"  Evaluation of current treatment plan related to hypertension self management and patient's adherence to plan as established by provider Reviewed medications with patient and discussed importance of compliance Discussed plans with patient for ongoing care management follow up and provided patient with direct contact information for care management team Advised patient, providing education and rationale, to monitor blood pressure daily and record, calling PCP for findings outside established parameters Reviewed scheduled/upcoming  provider appointments including:  Discussed complications of poorly controlled blood pressure such as heart disease, stroke, circulatory complications, vision complications, kidney impairment, sexual dysfunction Assessed social determinant of health barriers  Pain Interventions:  (Status:  New goal.) Long Term Goal Pain assessment performed Medications reviewed Reviewed provider established plan for pain management Discussed importance of adherence to all scheduled medical appointments Counseled on the importance of reporting any/all new or changed pain symptoms or management strategies to pain management provider Advised patient to report to care team affect of pain on daily activities Reviewed with patient prescribed pharmacological and nonpharmacological pain relief strategies Assessed social determinant of health barriers  Patient Goals/Self-Care Activities: Take all medications as prescribed Attend all scheduled provider appointments Call pharmacy for medication refills 3-7 days in advance of running out of medications Perform all self care activities independently  Perform IADL's (shopping, preparing meals, housekeeping, managing finances) independently Call provider office for new concerns or questions  03/17/23:  To reschedule GI appt-completed 05/16/23:  Schedule Neurosurgery appt-completed  Follow Up Plan:  The patient has been provided with contact information for the care management team and has been advised to call with any health related questions or concerns.  The care management team will reach out to the patient again over the next 45 business  days.   Follow Up:  Patient agrees to Care Plan and Follow-up.  Plan: The Managed Medicaid care management team will reach out to the patient again over the next 30 business  days. and The  Patient has been provided with contact information for the Managed Medicaid care management team and has been advised to call with any health  related questions or concerns.  Date/time of next scheduled RN care management/care coordination outreach:  10/13/23 at 1

## 2023-09-12 NOTE — ED Provider Notes (Signed)
 [facility] Provider Note    Event Date/Time   First MD Initiated Contact with Patient 09/12/23 2152     (approximate)   History   Chief Complaint Shortness of Breath   HPI  Sara Munoz is a 58 y.o. female with past medical history of hypertension, COPD, anemia of chronic disease, polysubstance abuse, and chronic pain syndrome who presents to the ED complaining of shortness of breath.  Patient reports that she has been feeling short of breath with a productive cough for the past week.  She also describes sharp discomfort in her chest radiating towards her back with some generalized weakness.  She has COPD and has been using her inhaler at home without significant relief.  She denies any fevers and has not had any nausea, vomiting, or diarrhea.  She also denies any pain or swelling in her legs.  She was given 125 mg of IV Solu-Medrol by EMS prior to arrival along with 1 DuoNeb.     Physical Exam   Triage Vital Signs: ED Triage Vitals  Encounter Vitals Group     BP 09/12/23 1859 (!) 126/51     Systolic BP Percentile --      Diastolic BP Percentile --      Pulse Rate 09/12/23 1859 73     Resp 09/12/23 1859 20     Temp 09/12/23 1859 98.1 F (36.7 C)     Temp Source 09/12/23 1859 Oral     SpO2 09/12/23 1859 94 %     Weight --      Height --      Head Circumference --      Peak Flow --      Pain Score 09/12/23 1902 8     Pain Loc --      Pain Education --      Exclude from Growth Chart --     Most recent vital signs: Vitals:   09/12/23 2302 09/12/23 2330  BP: 117/64 124/68  Pulse: 69 64  Resp: 17 13  Temp:    SpO2: 93%     Constitutional: Alert and oriented. Eyes: Conjunctivae are normal. Head: Atraumatic. Nose: No congestion/rhinnorhea. Mouth/Throat: Mucous membranes are moist.  Cardiovascular: Normal rate, regular rhythm. Grossly normal heart sounds.  2+ radial pulses bilaterally. Respiratory: Normal respiratory effort.  No  retractions. Lungs CTAB. Gastrointestinal: Soft and nontender. No distention.  Rectal exam with light brown guaiac-negative stool. Musculoskeletal: No lower extremity tenderness nor edema.  Neurologic:  Normal speech and language. No gross focal neurologic deficits are appreciated.    ED Results / Procedures / Treatments   Labs (all labs ordered are listed, but only abnormal results are displayed) Labs Reviewed  BASIC METABOLIC PANEL - Abnormal; Notable for the following components:      Result Value   BUN 21 (*)    Creatinine, Ser 1.09 (*)    Calcium 8.7 (*)    GFR, Estimated 60 (*)    All other components within normal limits  CBC - Abnormal; Notable for the following components:   RBC 3.10 (*)    Hemoglobin 5.8 (*)    HCT 20.7 (*)    MCV 66.8 (*)    MCH 18.7 (*)    MCHC 28.0 (*)    RDW 19.9 (*)    nRBC 0.7 (*)    All other components within normal limits  RESP PANEL BY RT-PCR (RSV, FLU A&B, COVID)  RVPGX2  LACTIC ACID, PLASMA  IRON  AND TIBC  FERRITIN  VITAMIN B12  FOLATE  RETICULOCYTES  TYPE AND SCREEN  PREPARE RBC (CROSSMATCH)  TROPONIN I (HIGH SENSITIVITY)     EKG  ED ECG REPORT I, Chesley Noon, the attending physician, personally viewed and interpreted this ECG.   Date: 09/12/2023  EKG Time: 19:09  Rate: 73  Rhythm: normal sinus rhythm  Axis: Normal  Intervals:none  ST&T Change: None  RADIOLOGY Chest x-ray reviewed and interpreted by me with small right-sided effusion, no focal infiltrate or edema noted.  PROCEDURES:  Critical Care performed: Yes, see critical care procedure note(s)  .Critical Care  Performed by: Chesley Noon, MD Authorized by: Chesley Noon, MD   Critical care provider statement:    Critical care time (minutes):  30   Critical care time was exclusive of:  Separately billable procedures and treating other patients and teaching time   Critical care was necessary to treat or prevent imminent or life-threatening  deterioration of the following conditions: Anemia.   Critical care was time spent personally by me on the following activities:  Development of treatment plan with patient or surrogate, discussions with consultants, evaluation of patient's response to treatment, examination of patient, ordering and review of laboratory studies, ordering and review of radiographic studies, ordering and performing treatments and interventions, pulse oximetry, re-evaluation of patient's condition and review of old charts   I assumed direction of critical care for this patient from another provider in my specialty: no      MEDICATIONS ORDERED IN ED: Medications  0.9 %  sodium chloride infusion (10 mL/hr Intravenous New Bag/Given 09/12/23 2251)  iohexol (OMNIPAQUE) 350 MG/ML injection 75 mL (75 mLs Intravenous Contrast Given 09/12/23 2235)     IMPRESSION / MDM / ASSESSMENT AND PLAN / ED COURSE  I reviewed the triage vital signs and the nursing notes.                              58 y.o. female with past medical history of hypertension, anemia of chronic disease, COPD, polysubstance abuse, and chronic pain syndrome who presents to the ED complaining of 1 week of shortness of breath, generalized weakness, chest pain, and malaise.  Patient's presentation is most consistent with acute presentation with potential threat to life or bodily function.  Differential diagnosis includes, but is not limited to, ACS, PE, pneumonia, pneumothorax, musculoskeletal pain, GERD, COPD exacerbation, COVID-19, influenza, anemia, electrolyte abnormality, AKI.  Patient chronically ill but nontoxic-appearing and in no acute distress, vital signs are unremarkable.  She is not in any respiratory distress, lungs are clear to auscultation bilaterally, and she is maintaining oxygen saturations at 93% on room air.  No significant wheezing noted on exam, labs do show acute on chronic anemia with hemoglobin of 5.8.  This is likely the primary driver  of her symptoms, cardiac workup reassuring with unremarkable EKG and troponin within normal limits.  Chest x-ray with questionable right lower lobe density, however CTA of the chest is negative for PE or other acute finding.  Patient has not noticed any recent bleeding and rectal exam with guaiac negative stool.  We will transfuse 2 units PRBCs and plan to discuss with hospitalist for admission.      FINAL CLINICAL IMPRESSION(S) / ED DIAGNOSES   Final diagnoses:  Shortness of breath  Anemia, unspecified type     Rx / DC Orders   ED Discharge Orders     None  Note:  This document was prepared using Dragon voice recognition software and may include unintentional dictation errors.   Chesley Noon, MD 09/12/23 [phone]

## 2023-09-12 NOTE — ED Triage Notes (Signed)
 Pt was sent by PCP for productive cough and SHOB x1 week. Pt c/o decreased appetite, nausea, and weakness. Pt has COPD- pt does not use oxygen at home. Pt AOX4, NAD noted, pt speaking in full sentences. Cough noted.  Per EMS- Rhonchi bilaterally 125 solu-medrol given IV 1 duoneb given en route 96% RA after duoneb 122/68 99- blood sugar 20 gauge IV left AC placed

## 2023-09-12 NOTE — Patient Instructions (Signed)
 Visit Information  Sara Munoz was given information about Medicaid Managed Care team care coordination services as a part of their [place] Medicaid benefit. Sara Munoz verbally consented to engagement with the [facility] Managed Care team.   If you are experiencing a medical emergency, please call 911 or report to your local emergency department or urgent care.   If you have a non-emergency medical problem during routine business hours, please contact your provider's office and ask to speak with a nurse.   For questions related to your [place] health plan, please call: [phone]  If you would like to schedule transportation through your [place] plan, please call the following number at least 2 days in advance of your appointment: [phone].   There is no limit to the number of trips during the year between medical appointments, healthcare facilities, or pharmacies. Transportation must be scheduled at least 2 business days before but not more than thirty 30 days before of your appointment.  Call the Behavioral Health Crisis Line at [phone], at any time, 24 hours a day, 7 days a week. If you are in danger or need immediate medical attention call 911.  If you would like help to quit smoking, call 1-800-QUIT-NOW ((319) 798-8034) OR Espaol: 1-855-Djelo-Ya (4-132-440-1027) o para ms informacin haga clic aqu or Text READY to 253-664 to register via text  Sara Munoz - following are the goals we discussed in your visit today:  Timeframe:  Long-Range Goal Priority:  High Start Date:       12/20/20                      Expected End Date:   ongoing     Follow up date:  10/13/23  - schedule appointment for flu shot - schedule appointment for vaccines needed due to my age or health - schedule recommended health tests (blood work, mammogram, colonoscopy, pap test) - schedule and keep appointment for annual check-up   Why is this  important?   Screening tests can find diseases early when they are easier to treat.  Your doctor or nurse will talk with you about which tests are important for you.  Getting shots for common diseases like the flu and shingles will help prevent them.   09/12/23: Completed PT/OT.  Upcoming PULM appt and Neurosurgeon appt   Patient verbalizes understanding of instructions and care plan provided today and agrees to view in MyChart. Active MyChart status and patient understanding of how to access instructions and care plan via MyChart confirmed with patient.     The Managed Medicaid care management team will reach out to the patient again over the next 30 business  days.  The  Patient  has been provided with contact information for the Managed Medicaid care management team and has been advised to call with any health related questions or concerns.   Kathi Der RNC, BSN, [employer] [facility] RN Care Manager Direct Dial [phone] Website: [email or web address]   Following is a copy of your plan of care:  Care Plan : RN Care Manager Plan of Care  Updates made by [name], RN since 09/12/2023 12:00 AM     Problem: Health Promotion or Disease Self-Management (General Plan of Care)      Long-Range Goal: Chronic Disease Management   Start Date: 09/12/2022  Expected End Date: 09/14/2023  Priority: High  Note:   Current Barriers:  Knowledge Deficits related to plan of  care for management of GERD, CAD, HTN, PVD, TIA, migranes, COPD, OSA, tobacco use, osteoarthritis, chronic pain, anxiety  Chronic Disease Management support and education needs related to  GERD, CAD, HTN, PVD, TIA, migranes, COPD, OSA, tobacco use, osteoarthritis, chronic pain, anxiety  09/12/23: Completed PT/OT.  Smokes 5-8 cigarettes a day.  Some SOB-has PULM appt 3/24.  Seen by CARDS 3/5.  Ongoing post op pain-has f/u appt with Neurosurgeon scheduled.   RNCM Clinical Goal(s):  Patient  will verbalize understanding of plan for management of GERD, CAD, HTN, PVD, TIA, migranes, COPD, OSA, tobacco use, osteoarthritis, chronic pain, anxiety   as evidenced by patient report verbalize basic understanding of  GERD, CAD, HTN, PVD, TIA, migranes, COPD, OSA, tobacco use, osteoarthritis, chronic pain, anxiety  disease process and self health management plan as evidenced by patient report take all medications exactly as prescribed and will call provider for medication related questions as evidenced by patient report  demonstrate understanding of rationale for each prescribed medication as evidenced by patient report attend all scheduled medical appointments as evidenced by patient report and EMR review demonstrate Ongoing adherence to prescribed treatment plan for  GERD, CAD, HTN, PVD, TIA, migranes, COPD, OSA, tobacco use, osteoarthritis, chronic pain, anxiety as evidenced by patient report continue to work with RN Care Manager to address care management and care coordination needs related to GERD, CAD, HTN, PVD, TIA, migranes, COPD, OSA, tobacco use, osteoarthritis, chronic pain, anxiety  as evidenced by adherence to CM Team Scheduled appointments through collaboration with RN Care manager, provider, and care team.   Interventions: Inter-disciplinary care team collaboration (see longitudinal plan of care) Evaluation of current treatment plan related to  self management and patient's adherence to plan as established by provider 01/03/23-discussed protein drink supplementation, MVI  CAD Interventions: (Status:  New goal.) Long Term Goal Assessed understanding of CAD diagnosis Medications reviewed including medications utilized in CAD treatment plan Counseled on importance of regular laboratory monitoring as prescribed Reviewed Importance of taking all medications as prescribed Reviewed Importance of attending all scheduled provider appointments Advised to report any changes in symptoms or  exercise tolerance Screening for signs and symptoms of depression related to chronic disease state Assessed social determinant of health barriers  COPD Interventions:  (Status:  New goal.) Long Term Goal Advised patient to self assesses COPD action plan zone and make appointment with provider if in the yellow zone for 48 hours without improvement Advised patient to engage in light exercise as tolerated 3-5 days a week to aid in the the management of COPD Provided education about and advised patient to utilize infection prevention strategies to reduce risk of respiratory infection Discussed the importance of adequate rest and management of fatigue with COPD Assessed social determinant of health barriers  Hypertension Interventions:  (Status:  New goal.) Long Term Goal Last practice recorded BP readings:  BP Readings from Last 3 Encounters:        08/15/23            120/70  08/27/23         128/64 07/17/23         128/65  Most recent eGFR/CrCl:  Lab Results  Component Value Date   EGFR 83 04/08/2022    No components found for: "CRCL"  Evaluation of current treatment plan related to hypertension self management and patient's adherence to plan as established by provider Reviewed medications with patient and discussed importance of compliance Discussed plans with patient for ongoing care management follow up  and provided patient with direct contact information for care management team Advised patient, providing education and rationale, to monitor blood pressure daily and record, calling PCP for findings outside established parameters Reviewed scheduled/upcoming provider appointments including:  Discussed complications of poorly controlled blood pressure such as heart disease, stroke, circulatory complications, vision complications, kidney impairment, sexual dysfunction Assessed social determinant of health barriers  Pain Interventions:  (Status:  New goal.) Long Term Goal Pain  assessment performed Medications reviewed Reviewed provider established plan for pain management Discussed importance of adherence to all scheduled medical appointments Counseled on the importance of reporting any/all new or changed pain symptoms or management strategies to pain management provider Advised patient to report to care team affect of pain on daily activities Reviewed with patient prescribed pharmacological and nonpharmacological pain relief strategies Assessed social determinant of health barriers  Patient Goals/Self-Care Activities: Take all medications as prescribed Attend all scheduled provider appointments Call pharmacy for medication refills 3-7 days in advance of running out of medications Perform all self care activities independently  Perform IADL's (shopping, preparing meals, housekeeping, managing finances) independently Call provider office for new concerns or questions  03/17/23:  To reschedule GI appt-completed 05/16/23:  Schedule Neurosurgery appt-completed  Follow Up Plan:  The patient has been provided with contact information for the care management team and has been advised to call with any health related questions or concerns.  The care management team will reach out to the patient again over the next 45 business  days

## 2023-09-13 DIAGNOSIS — G629 Polyneuropathy, unspecified: Secondary | ICD-10-CM

## 2023-09-13 DIAGNOSIS — E785 Hyperlipidemia, unspecified: Secondary | ICD-10-CM | POA: Diagnosis not present

## 2023-09-13 DIAGNOSIS — Z8249 Family history of ischemic heart disease and other diseases of the circulatory system: Secondary | ICD-10-CM | POA: Diagnosis not present

## 2023-09-13 DIAGNOSIS — G894 Chronic pain syndrome: Secondary | ICD-10-CM | POA: Diagnosis present

## 2023-09-13 DIAGNOSIS — Z882 Allergy status to sulfonamides status: Secondary | ICD-10-CM | POA: Diagnosis not present

## 2023-09-13 DIAGNOSIS — K219 Gastro-esophageal reflux disease without esophagitis: Secondary | ICD-10-CM

## 2023-09-13 DIAGNOSIS — E876 Hypokalemia: Secondary | ICD-10-CM | POA: Diagnosis present

## 2023-09-13 DIAGNOSIS — Z887 Allergy status to serum and vaccine status: Secondary | ICD-10-CM | POA: Diagnosis not present

## 2023-09-13 DIAGNOSIS — K640 First degree hemorrhoids: Secondary | ICD-10-CM | POA: Diagnosis present

## 2023-09-13 DIAGNOSIS — E7800 Pure hypercholesterolemia, unspecified: Secondary | ICD-10-CM | POA: Diagnosis present

## 2023-09-13 DIAGNOSIS — I700 Atherosclerosis of aorta: Secondary | ICD-10-CM | POA: Diagnosis present

## 2023-09-13 DIAGNOSIS — Z79899 Other long term (current) drug therapy: Secondary | ICD-10-CM | POA: Diagnosis not present

## 2023-09-13 DIAGNOSIS — Z885 Allergy status to narcotic agent status: Secondary | ICD-10-CM | POA: Diagnosis not present

## 2023-09-13 DIAGNOSIS — Z1152 Encounter for screening for COVID-19: Secondary | ICD-10-CM | POA: Diagnosis not present

## 2023-09-13 DIAGNOSIS — I739 Peripheral vascular disease, unspecified: Secondary | ICD-10-CM | POA: Diagnosis present

## 2023-09-13 DIAGNOSIS — F17210 Nicotine dependence, cigarettes, uncomplicated: Secondary | ICD-10-CM | POA: Diagnosis present

## 2023-09-13 DIAGNOSIS — I2510 Atherosclerotic heart disease of native coronary artery without angina pectoris: Secondary | ICD-10-CM | POA: Diagnosis present

## 2023-09-13 DIAGNOSIS — R0602 Shortness of breath: Secondary | ICD-10-CM | POA: Diagnosis present

## 2023-09-13 DIAGNOSIS — K921 Melena: Secondary | ICD-10-CM | POA: Diagnosis not present

## 2023-09-13 DIAGNOSIS — D649 Anemia, unspecified: Secondary | ICD-10-CM | POA: Diagnosis not present

## 2023-09-13 DIAGNOSIS — I10 Essential (primary) hypertension: Secondary | ICD-10-CM

## 2023-09-13 DIAGNOSIS — D638 Anemia in other chronic diseases classified elsewhere: Secondary | ICD-10-CM | POA: Diagnosis present

## 2023-09-13 DIAGNOSIS — D509 Iron deficiency anemia, unspecified: Secondary | ICD-10-CM | POA: Diagnosis present

## 2023-09-13 DIAGNOSIS — Z79891 Long term (current) use of opiate analgesic: Secondary | ICD-10-CM | POA: Diagnosis not present

## 2023-09-13 DIAGNOSIS — J449 Chronic obstructive pulmonary disease, unspecified: Secondary | ICD-10-CM | POA: Insufficient documentation

## 2023-09-13 DIAGNOSIS — Z6831 Body mass index (BMI) 31.0-31.9, adult: Secondary | ICD-10-CM | POA: Diagnosis not present

## 2023-09-13 DIAGNOSIS — E669 Obesity, unspecified: Secondary | ICD-10-CM | POA: Diagnosis present

## 2023-09-13 DIAGNOSIS — J441 Chronic obstructive pulmonary disease with (acute) exacerbation: Secondary | ICD-10-CM | POA: Diagnosis present

## 2023-09-13 LAB — RETICULOCYTES
Immature Retic Fract: 30.5 % — ABNORMAL HIGH (ref 2.3–15.9)
RBC.: 3.22 MIL/uL — ABNORMAL LOW (ref 3.87–5.11)
Retic Count, Absolute: 77.3 10*3/uL (ref 19.0–186.0)
Retic Ct Pct: 2.4 % (ref 0.4–3.1)

## 2023-09-13 LAB — HEPATIC FUNCTION PANEL
ALT: 13 U/L (ref 0–44)
AST: 18 U/L (ref 15–41)
Albumin: 3.5 g/dL (ref 3.5–5.0)
Alkaline Phosphatase: 66 U/L (ref 38–126)
Bilirubin, Direct: 0.1 mg/dL (ref 0.0–0.2)
Indirect Bilirubin: 0.8 mg/dL (ref 0.3–0.9)
Total Bilirubin: 0.9 mg/dL (ref 0.0–1.2)
Total Protein: 6.5 g/dL (ref 6.5–8.1)

## 2023-09-13 LAB — BASIC METABOLIC PANEL
Anion gap: 9 (ref 5–15)
BUN: 18 mg/dL (ref 6–20)
CO2: 23 mmol/L (ref 22–32)
Calcium: 8.8 mg/dL — ABNORMAL LOW (ref 8.9–10.3)
Chloride: 104 mmol/L (ref 98–111)
Creatinine, Ser: 0.82 mg/dL (ref 0.44–1.00)
GFR, Estimated: >60 mL/min (ref >60)
Glucose, Bld: 168 mg/dL — ABNORMAL HIGH (ref 70–99)
Potassium: 3.9 mmol/L (ref 3.5–5.1)
Sodium: 136 mmol/L (ref 135–145)

## 2023-09-13 LAB — CBC
HCT: 27 % — ABNORMAL LOW (ref 36.0–46.0)
Hemoglobin: 8.1 g/dL — ABNORMAL LOW (ref 12.0–15.0)
MCH: 21 pg — ABNORMAL LOW (ref 26.0–34.0)
MCHC: 30 g/dL (ref 30.0–36.0)
MCV: 69.9 fL — ABNORMAL LOW (ref 80.0–100.0)
Platelets: 158 10*3/uL (ref 150–400)
RBC: 3.86 MIL/uL — ABNORMAL LOW (ref 3.87–5.11)
RDW: 22.5 % — ABNORMAL HIGH (ref 11.5–15.5)
WBC: 6.1 10*3/uL (ref 4.0–10.5)
nRBC: 0.8 % — ABNORMAL HIGH (ref 0.0–0.2)

## 2023-09-13 LAB — VITAMIN B12: Vitamin B-12: 317 pg/mL (ref 180–914)

## 2023-09-13 LAB — LACTATE DEHYDROGENASE: LDH: 146 U/L (ref 98–192)

## 2023-09-13 LAB — FOLATE: Folate: 24 ng/mL (ref >5.9)

## 2023-09-13 LAB — OCCULT BLOOD X 1 CARD TO LAB, STOOL: Fecal Occult Bld: NEGATIVE

## 2023-09-13 LAB — HIV ANTIBODY (ROUTINE TESTING W REFLEX): HIV Screen 4th Generation wRfx: NONREACTIVE

## 2023-09-13 MED ORDER — NITROGLYCERIN 0.4 MG SL SUBL: 0.4000 mg | SUBLINGUAL_TABLET | SUBLINGUAL | Status: DC | PRN

## 2023-09-13 MED ORDER — DOCUSATE SODIUM 100 MG PO CAPS
100.0000 mg | ORAL_CAPSULE | Freq: Every day | ORAL | Status: DC
  Administered 2023-09-13 – 2023-09-16 (×4): 100 mg via ORAL
  Filled 2023-09-13 (×4): qty 1

## 2023-09-13 MED ORDER — ONDANSETRON HCL 4 MG PO TABS: 4.0000 mg | ORAL_TABLET | Freq: Four times a day (QID) | ORAL | Status: DC | PRN

## 2023-09-13 MED ORDER — ALBUTEROL SULFATE HFA 108 (90 BASE) MCG/ACT IN AERS: 2.0000 | INHALATION_SPRAY | RESPIRATORY_TRACT | Status: DC | PRN

## 2023-09-13 MED ORDER — PANTOPRAZOLE SODIUM 40 MG IV SOLR
40.0000 mg | Freq: Two times a day (BID) | INTRAVENOUS | Status: DC
  Administered 2023-09-13 – 2023-09-15 (×5): 40 mg via INTRAVENOUS
  Filled 2023-09-13 (×5): qty 10

## 2023-09-13 MED ORDER — METHOCARBAMOL 500 MG PO TABS
750.0000 mg | ORAL_TABLET | Freq: Four times a day (QID) | ORAL | Status: DC | PRN
  Administered 2023-09-13: 750 mg via ORAL
  Filled 2023-09-13: qty 2

## 2023-09-13 MED ORDER — UMECLIDINIUM BROMIDE 62.5 MCG/ACT IN AEPB
1.0000 | INHALATION_SPRAY | Freq: Every day | RESPIRATORY_TRACT | Status: DC
  Administered 2023-09-13 – 2023-09-16 (×4): 1 via RESPIRATORY_TRACT
  Filled 2023-09-13: qty 7

## 2023-09-13 MED ORDER — ISOSORBIDE MONONITRATE ER 30 MG PO TB24
15.0000 mg | ORAL_TABLET | Freq: Every day | ORAL | Status: DC
  Administered 2023-09-13: 15 mg via ORAL
  Filled 2023-09-13: qty 1

## 2023-09-13 MED ORDER — ATORVASTATIN CALCIUM 20 MG PO TABS
40.0000 mg | ORAL_TABLET | Freq: Every evening | ORAL | Status: DC
  Administered 2023-09-13 – 2023-09-15 (×3): 40 mg via ORAL
  Filled 2023-09-13 (×3): qty 2

## 2023-09-13 MED ORDER — ACETAMINOPHEN 325 MG PO TABS
650.0000 mg | ORAL_TABLET | Freq: Four times a day (QID) | ORAL | Status: DC | PRN
  Administered 2023-09-13: 650 mg via ORAL
  Filled 2023-09-13: qty 2

## 2023-09-13 MED ORDER — IRON SUCROSE 300 MG IVPB - SIMPLE MED
300.0000 mg | Status: AC
  Administered 2023-09-13 – 2023-09-16 (×4): 300 mg via INTRAVENOUS
  Filled 2023-09-13 (×4): qty 300

## 2023-09-13 MED ORDER — SUCRALFATE 1 GM/10ML PO SUSP
1.0000 g | Freq: Four times a day (QID) | ORAL | Status: DC
  Administered 2023-09-13 – 2023-09-15 (×12): 1 g via ORAL
  Filled 2023-09-13 (×14): qty 10

## 2023-09-13 MED ORDER — METHOCARBAMOL 500 MG PO TABS: 750.0000 mg | ORAL_TABLET | Freq: Once | ORAL | Status: DC

## 2023-09-13 MED ORDER — ALBUTEROL SULFATE (2.5 MG/3ML) 0.083% IN NEBU
2.5000 mg | INHALATION_SOLUTION | RESPIRATORY_TRACT | Status: DC | PRN
  Administered 2023-09-14 – 2023-09-15 (×2): 2.5 mg via RESPIRATORY_TRACT
  Filled 2023-09-13 (×2): qty 3

## 2023-09-13 MED ORDER — AMLODIPINE BESYLATE 5 MG PO TABS
5.0000 mg | ORAL_TABLET | Freq: Every day | ORAL | Status: DC
  Administered 2023-09-13: 5 mg via ORAL
  Filled 2023-09-13: qty 1

## 2023-09-13 MED ORDER — METOPROLOL TARTRATE 25 MG PO TABS
12.5000 mg | ORAL_TABLET | Freq: Two times a day (BID) | ORAL | Status: DC
  Administered 2023-09-13 (×2): 12.5 mg via ORAL
  Filled 2023-09-13 (×2): qty 1

## 2023-09-13 MED ORDER — LINACLOTIDE 145 MCG PO CAPS: 290.0000 ug | ORAL_CAPSULE | Freq: Every day | ORAL | Status: DC | PRN

## 2023-09-13 MED ORDER — PANTOPRAZOLE SODIUM 40 MG PO TBEC
40.0000 mg | DELAYED_RELEASE_TABLET | Freq: Every day | ORAL | Status: DC
  Administered 2023-09-13: 40 mg via ORAL
  Filled 2023-09-13: qty 1

## 2023-09-13 MED ORDER — MAGNESIUM HYDROXIDE 400 MG/5ML PO SUSP: 30.0000 mL | Freq: Every day | ORAL | Status: DC | PRN

## 2023-09-13 MED ORDER — TRAZODONE HCL 50 MG PO TABS
25.0000 mg | ORAL_TABLET | Freq: Every evening | ORAL | Status: DC | PRN
  Administered 2023-09-14: 25 mg via ORAL
  Filled 2023-09-13: qty 1

## 2023-09-13 MED ORDER — OXYCODONE HCL 5 MG PO TABS
15.0000 mg | ORAL_TABLET | ORAL | Status: DC
  Administered 2023-09-13 – 2023-09-16 (×20): 15 mg via ORAL
  Filled 2023-09-13: qty 3
  Filled 2023-09-13: qty 1
  Filled 2023-09-13 (×19): qty 3

## 2023-09-13 MED ORDER — LISINOPRIL 20 MG PO TABS
20.0000 mg | ORAL_TABLET | Freq: Every day | ORAL | Status: DC
  Administered 2023-09-13 – 2023-09-16 (×4): 20 mg via ORAL
  Filled 2023-09-13 (×2): qty 1
  Filled 2023-09-13: qty 2
  Filled 2023-09-13: qty 1

## 2023-09-13 MED ORDER — ACETAMINOPHEN 650 MG RE SUPP: 650.0000 mg | Freq: Four times a day (QID) | RECTAL | Status: DC | PRN

## 2023-09-13 MED ORDER — UMECLIDINIUM BROMIDE 62.5 MCG/ACT IN AEPB: 1.0000 | INHALATION_SPRAY | Freq: Every morning | RESPIRATORY_TRACT | Status: DC

## 2023-09-13 MED ORDER — ONDANSETRON HCL 4 MG/2ML IJ SOLN: 4.0000 mg | Freq: Four times a day (QID) | INTRAMUSCULAR | Status: DC | PRN

## 2023-09-13 MED ORDER — SODIUM CHLORIDE 0.9 % IV SOLN: INTRAVENOUS | Status: AC

## 2023-09-13 MED ORDER — OXYCODONE HCL 5 MG PO TABS
15.0000 mg | ORAL_TABLET | Freq: Once | ORAL | Status: AC
  Administered 2023-09-13: 15 mg via ORAL
  Filled 2023-09-13: qty 3

## 2023-09-13 MED ORDER — GABAPENTIN 300 MG PO CAPS
600.0000 mg | ORAL_CAPSULE | Freq: Four times a day (QID) | ORAL | Status: DC
Start: 2023-09-13 — End: 2023-09-16
  Administered 2023-09-13 – 2023-09-16 (×13): 600 mg via ORAL
  Filled 2023-09-13 (×13): qty 2

## 2023-09-13 NOTE — Hospital Course (Signed)
 Erythematous mucosa in the gastric body and antrum. Biopsied. 10/24 upper endocscopy. Negative for h pylori. Negative celiacs.   Colonoscopy poor prep

## 2023-09-13 NOTE — Assessment & Plan Note (Signed)
-   We will continue her inhalers. 

## 2023-09-13 NOTE — Assessment & Plan Note (Signed)
-   The patient admitted to a medical telemetry bed. - We will follow anemia workup. - She was typed and crossmatch and will be transfused 2 units of packed red blood cells. - We will follow posttransfusion H&H.

## 2023-09-13 NOTE — Progress Notes (Signed)
 Progress Note Not for billing purposes.   Patient: Sara Munoz ZOX:[id number] DOB: April 03, 1966 DOA: 09/12/2023     0 DOS: the patient was seen and examined on 09/13/2023   Brief hospital course: No notes on file  Assessment and Plan: * Symptomatic anemia Hemoglobin responded appropriately to 2 units.  Patient was worked up for iron deficiency anemia in the outpatient setting around 03/2023.  She underwent a EGD and colonoscopy.  EGD was notable for erythematous mucosa of the stomach with pathology negative for H. pylori.  Colonoscopy was limited due to poor prep.  Patient was to return in 3 months and continue iron supplementation as well as stop BC powder use.  Patient reports she continues to use BC powder about 3 times a day.  She is not currently taking oral iron supplementation.  She notes that she has black stools occasionally, the last of which was about 2 weeks ago.  Discussed case with GI who recommended IV PPI, clear liquid diet, and likely EGD/colonoscopy. -Will start IV iron while patient is here in the hospital  Essential hypertension - Continue home antihypertensive therapy.  H/o CAD, TI  Dyslipidemia - Continue home statin therapy.  GERD without esophagitis - Continue PPI therapy as well as Carafate.  Chronic obstructive pulmonary disease (COPD) (HCC) Is not in acute exacerbation, continue her inhalers.  Peripheral neuropathy - Continue home Neurontin.  S/p thoracic decompression and fusion  On chronic opioid theapy       Subjective: Patient is feeling better after blood.   Physical Exam: Vitals:   09/13/23 0315 09/13/23 0330 09/13/23 0430 09/13/23 0650  BP: 122/66 127/73 126/65 123/70  Pulse: 64 (!) 57  (!) 58  Resp: 15 11  14   Temp:   97.8 F (36.6 C)   TempSrc:   Oral   SpO2: 95% 99%  96%    Constitutional: In no distress.  Cardiovascular: Normal rate, regular rhythm. No lower extremity edema  Pulmonary: Non labored breathing on room air, no  wheezing or rales.  Abdominal: Soft. Normal bowel sounds. Non distended and non tender Musculoskeletal: Normal range of motion.     Neurological: Alert and oriented to person, place, and time. Non focal  Skin: Skin is warm and dry.    Data Reviewed:     Latest Ref Rng & Units 09/13/2023    4:15 AM 09/12/2023    7:07 PM 07/19/2023   11:03 PM  CBC  WBC 4.0 - 10.5 K/uL 6.1  7.1  10.3   Hemoglobin 12.0 - 15.0 g/dL 8.1  5.8  7.7   Hematocrit 36.0 - 46.0 % 27.0  20.7  25.5   Platelets 150 - 400 K/uL 158  174  373       Latest Ref Rng & Units 09/13/2023    4:15 AM 09/12/2023    7:07 PM 07/19/2023   11:03 PM  BMP  Glucose 70 - 99 mg/dL 811  71  914   BUN 6 - 20 mg/dL 18  21  16    Creatinine 0.44 - 1.00 mg/dL 7.82  9.56  2.13   Sodium 135 - 145 mmol/L 136  135  134   Potassium 3.5 - 5.1 mmol/L 3.9  3.8  3.9   Chloride 98 - 111 mmol/L 104  102  97   CO2 22 - 32 mmol/L 23  24  24    Calcium 8.9 - 10.3 mg/dL 8.8  8.7  8.9      Family Communication: None  Disposition: Status is: Inpatient Remains inpatient appropriate because: further work up of her  Planned Discharge Destination: Home    Time spent: 35 minutes  Author: Marolyn Haller, MD 09/13/2023 9:23 AM  For on call review [email or web address].

## 2023-09-13 NOTE — Assessment & Plan Note (Signed)
 -  We will continue statin therapy.

## 2023-09-13 NOTE — H&P (Signed)
 [place]   PATIENT NAME: Sara Munoz    MR#:  [id number]  DATE OF BIRTH:  November 29, 1965  DATE OF ADMISSION:  09/12/2023  PRIMARY CARE PHYSICIAN: Ronnald Ramp, MD   Patient is coming from: Home  REQUESTING/REFERRING PHYSICIAN: Ward, Layla Maw, DO  CHIEF COMPLAINT:   Chief Complaint  Patient presents with   Shortness of Breath    HISTORY OF PRESENT ILLNESS:  Sara Munoz is a 58 y.o. Caucasian female with medical history significant for asthma, anxiety, COPD, coronary artery disease, DDD, GERD, hypertension, dyslipidemia, PVD and TIA, who presented to the emergency room with acute onset of generalized fatigue and weakness with associated dyspnea mainly on exertion as well as nausea without vomiting and mild epigastric abdominal discomfort.  She admits to dry cough without wheezing or hemoptysis.  No fever or chills.  No dysuria, oliguria or hematuria or flank pain.  No current chest pain or palpitations.  No paresthesias or focal muscle weakness.  No other bleeding diathesis.  ED Course: When she came to the ER, BP was 136/81 with otherwise normal vital signs.  Labs revealed unremarkable BMP.  High-sensitivity troponin I was 5 and CBC showed anemia with hemoglobin 5.8 hematocrit 20.7 with microcytosis.  Blood glucose was O+ with negative antibody screen.  Respiratory panel came back negative.  Stool Hemoccult came back negative.  EKG as reviewed by me :  EKG showed normal sinus rhythm with rate of 73 with poor R wave progression. Imaging: Two-view chest x-ray showed the following 1. Trace to small right pleural effusion. 2. Question rounded density overlying the right lower lobe and ribs. 3. Followup PA and lateral chest X-ray is recommended in 3-4 weeks to ensure resolution. Chest CTA reviewed the following: 1. No pulmonary embolus. 2. No acute intrathoracic abnormality. 3. Aortic Atherosclerosis (ICD10-I70.0) including four-vessel coronary calcification and  mitral annular calcification. 4. Prominent but nonenlarged prevascular mediastinal and upper abdominal lymph nodes. Recommend attention on follow-up.  The patient was typed and crossmatched and will be transfused and it affected blood cells.  She will be admitted to a medical telemetry bed for further evaluation and management. PAST MEDICAL HISTORY:   Past Medical History:  Diagnosis Date   Anemia    Anxiety    Aortic atherosclerosis (HCC)    Asthma    Atypical chest pain 08/02/2015   Carotid artery disease (HCC)    Chronic back pain    Chronic nausea    Chronic, continuous use of opioids 02/27/2016   a.) has naloxone Rx available   COPD (chronic obstructive pulmonary disease) (HCC)    Coronary artery disease    a.) LHC 2011: no sig CAD (report unavailable); b.) LHC 07/07/2014: 10-20% mLM, 25% LAD, 30-40% LCx, 20-30% RCA -- med mgmt; c.) LHC 02/22/2016: 25% LM, 25% pLCx --> med mgmt; d.) MPI 07/10/2017: no ischemia; e.) MPI 09/01/2019: no ischemia; f.) cCTA 10/28/2022: Ca score 1092 (99th percentile for age/sex/race match control)   Current smoker    DDD (degenerative disc disease), cervical    a.) s/p ACDF C5-C7 and PSIF C5-T1   DDD (degenerative disc disease), lumbar 05/23/2020   a.) s/p L5-S1 PLIF   GERD (gastroesophageal reflux disease)    Hepatic steatosis    Hepatitis C virus infection cured after antiviral drug therapy    a.) s/p Tx with standard glecaprevir-pibrentasvir course   Hypercholesteremia    Hyperkalemia    Hyperlipidemia    Hypertension    Hypokalemia  Hypomagnesemia 01/04/2014   Incomplete tear of left rotator cuff    Insomnia    a.) on hypnotic PRN (zolpidem)   MI (myocardial infarction) (HCC) 2012   Migraines    MSSA (methicillin susceptible Staphylococcus aureus) septicemia (HCC) 03/03/2014   a.) TEE (-) for valvular vegitation   Nerve pain    per patient, in the lower back   Osteoarthritis    Pneumonia    PTSD (post-traumatic stress disorder)     PVD (peripheral vascular disease) (HCC)    Tendinitis of left rotator cuff    TIA (transient ischemic attack) 2014    PAST SURGICAL HISTORY:   Past Surgical History:  Procedure Laterality Date   ANTERIOR CERVICAL DECOMP/DISCECTOMY FUSION N/A 03/30/2018   Procedure: Cervical six-seven Anterior cervical decompression/discectomy/fusion;  Surgeon: Jadene Pierini, MD;  Location: [facility] OR;  Service: Neurosurgery;  Laterality: N/A;   ANTERIOR CERVICAL DECOMP/DISCECTOMY FUSION N/A 01/11/2019   Procedure: Cervical Five-Six Anterior cervical discectomy fusion,  Cervical Five to Cervical Seven anterior instrumented fusion;  Surgeon: Jadene Pierini, MD;  Location: [facility] OR;  Service: Neurosurgery;  Laterality: N/A;  Cervical Five-Six Anterior cervical discectomy fusion,  Cervical Five to Cervical Seven anterior instrumented fusion   APPLICATION OF INTRAOPERATIVE CT SCAN N/A 07/10/2023   Procedure: APPLICATION OF INTRAOPERATIVE CT SCAN;  Surgeon: Lovenia Kim, MD;  Location: [facility] ORS;  Service: Neurosurgery;  Laterality: N/A;   BIOPSY  04/21/2023   Procedure: BIOPSY;  Surgeon: Toney Reil, MD;  Location: [facility] ENDOSCOPY;  Service: Gastroenterology;;   CARDIAC CATHETERIZATION Left 02/22/2016   Procedure: Left Heart Cath and Coronary Angiography;  Surgeon: Alwyn Pea, MD;  Location: [facility] INVASIVE CV LAB;  Service: Cardiovascular;  Laterality: Left;   CATARACT EXTRACTION W/ INTRAOCULAR LENS IMPLANT Right    COLONOSCOPY WITH PROPOFOL N/A 09/19/2016   Procedure: COLONOSCOPY WITH PROPOFOL;  Surgeon: [name], MD;  Location: [facility] ENDOSCOPY;  Service: Endoscopy;  Laterality: N/A;   COLONOSCOPY WITH PROPOFOL N/A 04/21/2023   Procedure: COLONOSCOPY WITH PROPOFOL;  Surgeon: Toney Reil, MD;  Location: [facility] ENDOSCOPY;  Service: Gastroenterology;  Laterality: N/A;   DIAGNOSTIC LAPAROSCOPY     ESOPHAGOGASTRODUODENOSCOPY (EGD) WITH PROPOFOL N/A 09/18/2017   Procedure:  ESOPHAGOGASTRODUODENOSCOPY (EGD) WITH PROPOFOL;  Surgeon: [name], MD;  Location: [facility] ENDOSCOPY;  Service: Gastroenterology;  Laterality: N/A;   ESOPHAGOGASTRODUODENOSCOPY (EGD) WITH PROPOFOL N/A 08/10/2019   Procedure: ESOPHAGOGASTRODUODENOSCOPY (EGD) WITH PROPOFOL;  Surgeon: [name], MD;  Location: [facility] ENDOSCOPY;  Service: Endoscopy;  Laterality: N/A;   ESOPHAGOGASTRODUODENOSCOPY (EGD) WITH PROPOFOL N/A 04/21/2023   Procedure: ESOPHAGOGASTRODUODENOSCOPY (EGD) WITH PROPOFOL;  Surgeon: Toney Reil, MD;  Location: [facility] ENDOSCOPY;  Service: Gastroenterology;  Laterality: N/A;   HEMORRHOID SURGERY     LEFT HEART CATH AND CORONARY ANGIOGRAPHY Left 07/07/2014   LEFT HEART CATH AND CORONARY ANGIOGRAPHY Left 2011   LUMBAR WOUND DEBRIDEMENT N/A 11/05/2022   Procedure: Thoracic one spinous process removal and wound revision;  Surgeon: Jadene Pierini, MD;  Location: [facility] OR;  Service: Neurosurgery;  Laterality: N/A;   NASAL SINUS SURGERY     ORIF FEMUR FRACTURE Left    ORIF TIBIA & FIBULA FRACTURES Right    OVARIAN CYST SURGERY     POSTERIOR CERVICAL FUSION/FORAMINOTOMY N/A 05/14/2021   Procedure: Cervical five, Cervical six Laminectomy,foraminotomy with Cervical five-six Cervical six-seven Cervical seven-Thoracic one Posterior instrumented fusion;  Surgeon: Jadene Pierini, MD;  Location: [facility] OR;  Service: Neurosurgery;  Laterality: N/A;   SHOULDER ARTHROSCOPY WITH SUBACROMIAL DECOMPRESSION,  ROTATOR CUFF REPAIR AND BICEP TENDON REPAIR Left 05/28/2022   Procedure: SHOULDER ARTHROSCOPY WITH DEBRIDEMENT, DECOMPRESSION, ROTATOR CUFF REPAIR AND BICEP TENDON REPAIR;  Surgeon: Christena Flake, MD;  Location: [facility] ORS;  Service: Orthopedics;  Laterality: Left;   SHOULDER ARTHROSCOPY WITH SUBACROMIAL DECOMPRESSION, ROTATOR CUFF REPAIR AND BICEP TENDON REPAIR Left 12/03/2022   Procedure: SHOULDER Extensive ARTHROSCOPY WITH DEBRIDEMENT, removal of Retained Foreign Body;  Surgeon: Christena Flake,  MD;  Location: [facility] ORS;  Service: Orthopedics;  Laterality: Left;   SHOULDER ARTHROSCOPY WITH SUBACROMIAL DECOMPRESSION, ROTATOR CUFF REPAIR AND BICEP TENDON REPAIR Right 03/11/2023   Procedure: RIGHT SHOULDER ARTHROSCOPY WITH DEBRIDMENT, DECOMPRESSION and BICEPS TENODESIS.;  Surgeon: Christena Flake, MD;  Location: [facility] ORS;  Service: Orthopedics;  Laterality: Right;   THORACIC DISCECTOMY N/A 07/10/2023   Procedure: T8-9 & T9-10 TRANSPEDICULAR DECOMPRESSION/DISCECTOMY;  Surgeon: Lovenia Kim, MD;  Location: [facility] ORS;  Service: Neurosurgery;  Laterality: N/A;   TRANSFORAMINAL LUMBAR INTERBODY FUSION W/ MIS 1 LEVEL Right 05/23/2020   Procedure: Right Lumbar Five Sacral One Minimally invasive transforaminal lumbar interbody fusion;  Surgeon: Jadene Pierini, MD;  Location: [facility] OR;  Service: Neurosurgery;  Laterality: Right;  Right Lumbar Five Sacral One Minimally invasive transforaminal lumbar interbody fusion    SOCIAL HISTORY:   Social History   Tobacco Use   Smoking status: Some Days    Current packs/day: 0.50    Average packs/day: 0.5 packs/day for 35.0 years (17.5 ttl pk-yrs)    Types: Cigarettes   Smokeless tobacco: Never  Substance Use Topics   Alcohol use: Not Currently    Alcohol/week: 0.0 standard drinks of alcohol    FAMILY HISTORY:   Family History  Problem Relation Age of Onset   Heart disease Mother    Lung cancer Mother    Ovarian cancer Mother    Healthy Brother    Healthy Brother    Healthy Brother    Diabetes Maternal Uncle    Breast cancer Maternal Aunt     DRUG ALLERGIES:   Allergies  Allergen Reactions   Cyclobenzaprine Hives, Swelling and Rash    Facial/lip swelling      Levofloxacin Hives, Swelling and Rash   Chlorpheniramine Maleate     Other reaction(s): Unknown   Phenergan [Promethazine Hcl] Other (See Comments)    Agitation.    Phenylephrine Hcl Other (See Comments)    unknown   Toradol [Ketorolac Tromethamine] Swelling and Other (See  Comments)    Facial/tongue swelling    Zoloft [Sertraline Hcl] Swelling    Tongue swelling      Cephalexin Hives and Rash   Ketorolac Rash   Tramadol Hives, Swelling, Other (See Comments) and Rash    Lip swelling     REVIEW OF SYSTEMS:   ROS As per history of present illness. All pertinent systems were reviewed above. Constitutional, HEENT, cardiovascular, respiratory, GI, GU, musculoskeletal, neuro, psychiatric, endocrine, integumentary and hematologic systems were reviewed and are otherwise negative/unremarkable except for positive findings mentioned above in the HPI.   MEDICATIONS AT HOME:   Prior to Admission medications   Medication Sig Start Date End Date Taking? Authorizing Provider  amLODipine (NORVASC) 5 MG tablet TAKE 1 TABLET BY MOUTH EVERYDAY AT BEDTIME 07/31/23   [name], [name], MD  aspirin EC 81 MG tablet Take 81 mg by mouth in the morning. Swallow whole.    [provider]  atorvastatin (LIPITOR) 40 MG tablet Take 1 tablet (40 mg total) by mouth every evening. 09/11/23  Debbe Odea, MD  docusate sodium (COLACE) 100 MG capsule Take 100 mg by mouth in the morning.    [provider]  gabapentin (NEURONTIN) 600 MG tablet Take 600 mg by mouth 4 (four) times daily.    [provider]  isosorbide mononitrate (IMDUR) 30 MG 24 hr tablet Take 0.5 tablets (15 mg total) by mouth daily. 08/27/23 11/25/23  Debbe Odea, MD  LINZESS 290 MCG CAPS capsule Take 290 mcg by mouth daily as needed (constipation.). 09/06/21   [provider]  lisinopril (ZESTRIL) 20 MG tablet Take 1 tablet (20 mg total) by mouth daily. 08/21/23   [name], Tawanna Cooler, MD  methocarbamol (ROBAXIN) 750 MG tablet Take 1 tablet (750 mg total) by mouth every 6 (six) hours. 07/14/23   Susanne Borders, PA  metoprolol tartrate (LOPRESSOR) 25 MG tablet TAKE ONE-HALF TABLET BY MOUTH  TWICE DAILY 07/23/23   [name], Tawanna Cooler, MD  mometasone (ELOCON) 0.1 %  cream Apply 1 Application topically daily as needed (hand rash). 12/03/22   Poggi, Excell Seltzer, MD  naloxone [facility]) 4 MG/0.1ML LIQD nasal spray kit Place 1 spray into the nose as needed (opioid overdose).    [provider]  nitroGLYCERIN (NITROSTAT) 0.4 MG SL tablet Place 0.4 mg under the tongue every 5 (five) minutes as needed for chest pain.    [provider]  omeprazole (PRILOSEC) 40 MG capsule Take 1 capsule (40 mg total) by mouth 2 (two) times daily. 04/14/23   [name], MD  ondansetron (ZOFRAN) 8 MG tablet Take 1 tablet (8 mg total) by mouth every 8 (eight) hours as needed for nausea or vomiting. 08/19/23   [name], Tawanna Cooler, MD  oxyCODONE (ROXICODONE) 15 MG immediate release tablet Take 15 mg by mouth every 4 (four) hours. 08/05/23   [provider]  senna (SENOKOT) 8.6 MG TABS tablet Take 1 tablet (8.6 mg total) by mouth 2 (two) times daily as needed for mild constipation. 07/14/23   Susanne Borders, PA  sucralfate (CARAFATE) 1 GM/10ML suspension TAKE 10 MLS (1 G TOTAL) BY MOUTH 4 (FOUR) TIMES DAILY. 07/25/23   [name], MD  Tiotropium Bromide Monohydrate (SPIRIVA RESPIMAT) 2.5 MCG/ACT AERS Take 1 puff by mouth in the morning and at bedtime. Patient taking differently: Take 1 puff by mouth in the morning. 01/23/23   Jacky Kindle, FNP  umeclidinium bromide (INCRUSE ELLIPTA) 62.5 MCG/ACT AEPB Inhale 1 puff into the lungs in the morning.    [provider]  VENTOLIN HFA 108 (90 Base) MCG/ACT inhaler USE 1 TO 2 INHALATIONS BY MOUTH  INTO THE LUNGS EVERY 6 HOURS AS  NEEDED FOR SHORTNESS OF BREATH  OR WHEEZING 04/03/23   [name], [name], MD  zolpidem (AMBIEN) 5 MG tablet Take 1 tablet (5 mg total) by mouth at bedtime as needed for sleep. 08/19/23   [name], Tawanna Cooler, MD      VITAL SIGNS:  Blood pressure 127/73, pulse (!) 57, temperature 97.9 F (36.6 C), temperature source Oral, resp. rate 11, last menstrual period 12/16/2017, SpO2  99%.  PHYSICAL EXAMINATION:  Physical Exam  GENERAL:  58 y.o.-year-old Caucasian female patient lying in the bed with no acute distress.  EYES: Pupils equal, round, reactive to light and accommodation. No scleral icterus.  Positive pallor.  Extraocular muscles intact.  HEENT: Head atraumatic, normocephalic. Oropharynx and nasopharynx clear.  NECK:  Supple, no jugular venous distention. No thyroid enlargement, no tenderness.  LUNGS: Normal breath sounds bilaterally, no wheezing, rales,rhonchi or crepitation. No use of  accessory muscles of respiration.  CARDIOVASCULAR: Regular rate and rhythm, S1, S2 normal. No murmurs, rubs, or gallops.  ABDOMEN: Soft, nondistended, nontender. Bowel sounds present. No organomegaly or mass.  EXTREMITIES: No pedal edema, cyanosis, or clubbing.  NEUROLOGIC: Cranial nerves II through XII are intact. Muscle strength 5/5 in all extremities. Sensation intact. Gait not checked.  PSYCHIATRIC: The patient is alert and oriented x 3.  Normal affect and good eye contact. SKIN: No obvious rash, lesion, or ulcer.   LABORATORY PANEL:   CBC Recent Labs  Lab 09/12/23 1907  WBC 7.1  HGB 5.8*  HCT 20.7*  PLT 174   ------------------------------------------------------------------------------------------------------------------  Chemistries  Recent Labs  Lab 09/12/23 1907  NA 135  K 3.8  CL 102  CO2 24  GLUCOSE 71  BUN 21*  CREATININE 1.09*  CALCIUM 8.7*   ------------------------------------------------------------------------------------------------------------------  Cardiac Enzymes No results for input(s): "TROPONINI" in the last 168 hours. ------------------------------------------------------------------------------------------------------------------  RADIOLOGY:  CT Angio Chest PE W/Cm &/Or Wo Cm Result Date: 09/12/2023 CLINICAL DATA:  Pulmonary embolism (PE) suspected, high prob sent by PCP for productive cough and SHOB x1 week. Pt c/o decreased  appetite, nausea, and weakness. Pt has COPD- pt does not use oxygen at home. Pt AOX4, EXAM: CT ANGIOGRAPHY CHEST WITH CONTRAST TECHNIQUE: Multidetector CT imaging of the chest was performed using the standard protocol during bolus administration of intravenous contrast. Multiplanar CT image reconstructions and MIPs were obtained to evaluate the vascular anatomy. RADIATION DOSE REDUCTION: This exam was performed according to the departmental dose-optimization program which includes automated exposure control, adjustment of the mA and/or kV according to patient size and/or use of iterative reconstruction technique. CONTRAST:  75mL OMNIPAQUE IOHEXOL 350 MG/ML SOLN COMPARISON:  CT heart 10/28/2022, CT angio chest 03/27/2016 FINDINGS: Cardiovascular: Satisfactory opacification of the pulmonary arteries to the segmental level. No evidence of pulmonary embolism. The main pulmonary artery is normal in caliber. Normal heart size. No significant pericardial effusion. The thoracic aorta is normal in caliber. Moderate atherosclerotic plaque of the thoracic aorta. Four-vessel coronary artery calcifications. Mitral annular calcification. Mediastinum/Nodes: Prominent nonenlarged prevascular lymph nodes. No enlarged mediastinal, hilar, or axillary lymph nodes. Thyroid gland, trachea, and esophagus demonstrate no significant findings. Lungs/Pleura: Bilateral lower lobe atelectasis. No focal consolidation. Chronic right upper lobe cluster pulmonary micronodules (7:67; 5:32-42)) -no further follow-up indicated. No pulmonary mass. No pleural effusion. No pneumothorax. Upper Abdomen: Prominent but nonenlarged upper abdominal lymph nodes. No acute abnormality. Musculoskeletal: No chest wall abnormality. No suspicious lytic or blastic osseous lesions. No acute displaced fracture. CT8-T11 posterolateral surgical hardware. No CT evidence of surgical hardware complication. Review of the MIP images confirms the above findings. IMPRESSION: 1.  No pulmonary embolus. 2. No acute intrathoracic abnormality. 3. Aortic Atherosclerosis (ICD10-I70.0) including four-vessel coronary calcification and mitral annular calcification. 4. Prominent but nonenlarged prevascular mediastinal and upper abdominal lymph nodes. Recommend attention on follow-up. Electronically Signed   By: Tish Frederickson M.D.   On: 09/12/2023 23:12   DG Chest 2 View Result Date: 09/12/2023 CLINICAL DATA:  COPD EXAM: CHEST - 2 VIEW COMPARISON:  Chest x-ray 11/17/2019 FINDINGS: The heart and mediastinal contours are unchanged. Question rounded density overlying the right lower lobe and ribs. No pulmonary edema. Trace to small right pleural effusion. No right pleural effusion. No pneumothorax. No acute osseous abnormality. Cervical spine surgical hardware. Lower thoracic spine surgical hardware. IMPRESSION: 1. Trace to small right pleural effusion. 2. Question rounded density overlying the right lower lobe and ribs. 3. Followup PA and lateral  chest X-ray is recommended in 3-4 weeks to ensure resolution. Electronically Signed   By: Tish Frederickson M.D.   On: 09/12/2023 20:02      IMPRESSION AND PLAN:  Assessment and Plan: * Symptomatic anemia - The patient admitted to a medical telemetry bed. - We will follow anemia workup. - She was typed and crossmatch and will be transfused 2 units of packed red blood cells. - We will follow posttransfusion H&H.  Essential hypertension - We will continue antihypertensive therapy.  Dyslipidemia - We will continue statin therapy.  GERD without esophagitis - We will continue PPI therapy as well as Carafate.  Chronic obstructive pulmonary disease (COPD) (HCC) - We will continue her inhalers.  Peripheral neuropathy - We will continue Neurontin.   DVT prophylaxis: SCDs Advanced Care Planning:  Code Status: full code. Family Communication:  The plan of care was discussed in details with the patient (and family). I answered all  questions. The patient agreed to proceed with the above mentioned plan. Further management will depend upon hospital course. Disposition Plan: Back to previous home environment Consults called: none. All the records are reviewed and case discussed with ED provider.  Status is: Inpatient  At the time of the admission, it appears that the appropriate admission status for this patient is inpatient.  This is judged to be reasonable and necessary in order to provide the required intensity of service to ensure the patient's safety given the presenting symptoms, physical exam findings and initial radiographic and laboratory data in the context of comorbid conditions.  The patient requires inpatient status due to high intensity of service, high risk of further deterioration and high frequency of surveillance required.  I certify that at the time of admission, it is my clinical judgment that the patient will require inpatient hospital care extending more than 2 midnights.                            Dispo: The patient is from: Home              Anticipated d/c is to: Home              Patient currently is not medically stable to d/c.              Difficult to place patient: No  Hannah Beat M.D on 09/13/2023 at 4:56 AM  [facility]   From 7 PM-7 AM, contact night-coverage [email or web address]  CC: Primary care physician; Ronnald Ramp, MD

## 2023-09-13 NOTE — Assessment & Plan Note (Signed)
-   We will continue PPI therapy as well as Carafate.

## 2023-09-13 NOTE — Assessment & Plan Note (Signed)
-   We will continue antihypertensive therapy.

## 2023-09-13 NOTE — Assessment & Plan Note (Signed)
 -  We will continue Neurontin. ?

## 2023-09-13 NOTE — Plan of Care (Signed)
  Problem: Education: Goal: Knowledge of General Education information will improve Description: Including pain rating scale, medication(s)/side effects and non-pharmacologic comfort measures Outcome: Progressing   Problem: Health Behavior/Discharge Planning: Goal: Ability to manage health-related needs will improve Outcome: Progressing   Problem: Clinical Measurements: Goal: Ability to maintain clinical measurements within normal limits will improve Outcome: Progressing Goal: Will remain free from infection Outcome: Progressing   Problem: Activity: Goal: Risk for activity intolerance will decrease Outcome: Progressing   Problem: Nutrition: Goal: Adequate nutrition will be maintained Outcome: Progressing   Problem: Coping: Goal: Level of anxiety will decrease Outcome: Progressing   Problem: Elimination: Goal: Will not experience complications related to bowel motility Outcome: Progressing Goal: Will not experience complications related to urinary retention Outcome: Progressing   Problem: Pain Managment: Goal: General experience of comfort will improve and/or be controlled Outcome: Progressing   Problem: Safety: Goal: Ability to remain free from injury will improve Outcome: Progressing   Problem: Skin Integrity: Goal: Risk for impaired skin integrity will decrease Outcome: Progressing   Problem: Clinical Measurements: Goal: Diagnostic test results will improve Outcome: Not Applicable Goal: Respiratory complications will improve Outcome: Not Applicable Goal: Cardiovascular complication will be avoided Outcome: Not Applicable

## 2023-09-14 DIAGNOSIS — D649 Anemia, unspecified: Secondary | ICD-10-CM | POA: Diagnosis not present

## 2023-09-14 LAB — BASIC METABOLIC PANEL
Anion gap: 4 — ABNORMAL LOW (ref 5–15)
BUN: 17 mg/dL (ref 6–20)
CO2: 26 mmol/L (ref 22–32)
Calcium: 8.3 mg/dL — ABNORMAL LOW (ref 8.9–10.3)
Chloride: 108 mmol/L (ref 98–111)
Creatinine, Ser: 0.83 mg/dL (ref 0.44–1.00)
GFR, Estimated: >60 mL/min (ref >60)
Glucose, Bld: 86 mg/dL (ref 70–99)
Potassium: 3.4 mmol/L — ABNORMAL LOW (ref 3.5–5.1)
Sodium: 138 mmol/L (ref 135–145)

## 2023-09-14 LAB — TYPE AND SCREEN
ABO/RH(D): O POS
Antibody Screen: NEGATIVE
Unit division: 0
Unit division: 0

## 2023-09-14 LAB — HEMOGLOBIN AND HEMATOCRIT, BLOOD
HCT: 27.3 % — ABNORMAL LOW (ref 36.0–46.0)
Hemoglobin: 8.1 g/dL — ABNORMAL LOW (ref 12.0–15.0)

## 2023-09-14 LAB — BPAM RBC
Blood Product Expiration Date: 202504152359
Blood Product Expiration Date: 202504152359
ISSUE DATE / TIME: 202503220014
ISSUE DATE / TIME: 202503220210
Unit Type and Rh: 5100
Unit Type and Rh: 5100

## 2023-09-14 LAB — CBC
HCT: 25.9 % — ABNORMAL LOW (ref 36.0–46.0)
Hemoglobin: 7.7 g/dL — ABNORMAL LOW (ref 12.0–15.0)
MCH: 21 pg — ABNORMAL LOW (ref 26.0–34.0)
MCHC: 29.7 g/dL — ABNORMAL LOW (ref 30.0–36.0)
MCV: 70.6 fL — ABNORMAL LOW (ref 80.0–100.0)
Platelets: 149 10*3/uL — ABNORMAL LOW (ref 150–400)
RBC: 3.67 MIL/uL — ABNORMAL LOW (ref 3.87–5.11)
RDW: 21.9 % — ABNORMAL HIGH (ref 11.5–15.5)
WBC: 8 10*3/uL (ref 4.0–10.5)
nRBC: 2.7 % — ABNORMAL HIGH (ref 0.0–0.2)

## 2023-09-14 LAB — PHOSPHORUS: Phosphorus: 2.3 mg/dL — ABNORMAL LOW (ref 2.5–4.6)

## 2023-09-14 LAB — VITAMIN D 25 HYDROXY (VIT D DEFICIENCY, FRACTURES): Vit D, 25-Hydroxy: 35.49 ng/mL (ref 30–100)

## 2023-09-14 LAB — MAGNESIUM: Magnesium: 1.8 mg/dL (ref 1.7–2.4)

## 2023-09-14 MED ORDER — METOPROLOL TARTRATE 25 MG PO TABS
12.5000 mg | ORAL_TABLET | Freq: Two times a day (BID) | ORAL | Status: DC
  Administered 2023-09-15 – 2023-09-16 (×3): 12.5 mg via ORAL
  Filled 2023-09-14 (×3): qty 1

## 2023-09-14 MED ORDER — POLYETHYLENE GLYCOL 3350 17 GM/SCOOP PO POWD
238.0000 g | Freq: Once | ORAL | Status: AC
  Administered 2023-09-14: 238 g via ORAL
  Filled 2023-09-14: qty 238

## 2023-09-14 MED ORDER — AMLODIPINE BESYLATE 5 MG PO TABS
5.0000 mg | ORAL_TABLET | Freq: Every day | ORAL | Status: DC
  Administered 2023-09-15 – 2023-09-16 (×2): 5 mg via ORAL
  Filled 2023-09-14 (×3): qty 1

## 2023-09-14 MED ORDER — K PHOS MONO-SOD PHOS DI & MONO 155-852-130 MG PO TABS
500.0000 mg | ORAL_TABLET | Freq: Four times a day (QID) | ORAL | Status: AC
  Administered 2023-09-14 (×3): 500 mg via ORAL
  Filled 2023-09-14 (×3): qty 2

## 2023-09-14 MED ORDER — CYANOCOBALAMIN 1000 MCG/ML IJ SOLN
1000.0000 ug | Freq: Every day | INTRAMUSCULAR | Status: AC
  Administered 2023-09-14 – 2023-09-15 (×2): 1000 ug via INTRAMUSCULAR
  Filled 2023-09-14 (×2): qty 1

## 2023-09-14 MED ORDER — VITAMIN B-12 1000 MCG PO TABS
1000.0000 ug | ORAL_TABLET | Freq: Every day | ORAL | Status: DC
  Administered 2023-09-16: 1000 ug via ORAL
  Filled 2023-09-14: qty 1

## 2023-09-14 MED ORDER — ISOSORBIDE MONONITRATE ER 30 MG PO TB24
15.0000 mg | ORAL_TABLET | Freq: Every day | ORAL | Status: DC
  Administered 2023-09-15 – 2023-09-16 (×2): 15 mg via ORAL
  Filled 2023-09-14 (×2): qty 1

## 2023-09-14 MED ORDER — MAGNESIUM CITRATE PO SOLN
1.0000 | Freq: Once | ORAL | Status: AC
  Administered 2023-09-14: 1 via ORAL
  Filled 2023-09-14: qty 296

## 2023-09-14 NOTE — Consult Note (Signed)
 Sara Repress, MD [address]  [address]  [place], [place] [address]  Main: [phone]  Fax: [phone] Pager: [phone]   Consultation  Referring Provider:     No ref. provider found Primary Care Physician:  Ronnald Ramp, MD Primary Gastroenterologist:  Dr. [name]         Reason for Consultation: Intermittent melena, worsening anemia  Date of Admission:  09/12/2023 Date of Consultation:  09/14/2023         HPI:   Sara Munoz is a 58 y.o. female with history of hep C, genotype 3, s/p treatment, attained SVR, COPD, chronic constipation, known history of chronic iron deficiency anemia in the setting of frequent BC powder use. She was found to have a microcytic anemia since 2023 with severe iron deficiency. She had an EGD and colonoscopy 03/2023, And her EGD was notable for non bleeding erythematous mucosa of her gastric antrum and body that was negative for H pylori. She had a poor prep for the colonoscopy. She presented to the ED on 3/21 with worsening fatigue, exertional dyspnea, productive cough and had a Hb of 5.8. She states she continues to have intermittent black stools the most recent was ~2 weeks ago. She also continues to take [facility] powders (~3 packs perday).  Therefore, GI is consulted for further evaluation.  Patient received 2 units of PRBCs with appropriate response, started on Protonix 40 mg IV twice daily Patient was treated in the ER for possible COPD exacerbation.  CT angio chest PE protocol during this admission did not reveal any evidence of PE Patient reported that she had bowel movement yesterday.  None today, started on MiraLAX prep, finished 1 bottle  NSAIDs: BC powder use  Antiplts/Anticoagulants/Anti thrombotics: None  GI Procedures:  09/18/17 : EGD: normal appearance but bx showed mild active gastritis and negative for H pylori .   08/10/2019 EGD: Localized inflammation seen in the antrum suggestive of gastritis biopsies taken  that demonstrated reactive gastropathy.  Negative for H. pylori.    Past Medical History:  Diagnosis Date   Anemia    Anxiety    Aortic atherosclerosis (HCC)    Asthma    Atypical chest pain 08/02/2015   Carotid artery disease (HCC)    Chronic back pain    Chronic nausea    Chronic, continuous use of opioids 02/27/2016   a.) has naloxone Rx available   COPD (chronic obstructive pulmonary disease) (HCC)    Coronary artery disease    a.) LHC 2011: no sig CAD (report unavailable); b.) LHC 07/07/2014: 10-20% mLM, 25% LAD, 30-40% LCx, 20-30% RCA -- med mgmt; c.) LHC 02/22/2016: 25% LM, 25% pLCx --> med mgmt; d.) MPI 07/10/2017: no ischemia; e.) MPI 09/01/2019: no ischemia; f.) cCTA 10/28/2022: Ca score 1092 (99th percentile for age/sex/race match control)   Current smoker    DDD (degenerative disc disease), cervical    a.) s/p ACDF C5-C7 and PSIF C5-T1   DDD (degenerative disc disease), lumbar 05/23/2020   a.) s/p L5-S1 PLIF   GERD (gastroesophageal reflux disease)    Hepatic steatosis    Hepatitis C virus infection cured after antiviral drug therapy    a.) s/p Tx with standard glecaprevir-pibrentasvir course   Hypercholesteremia    Hyperkalemia    Hyperlipidemia    Hypertension    Hypokalemia    Hypomagnesemia 01/04/2014   Incomplete tear of left rotator cuff    Insomnia    a.) on hypnotic PRN (zolpidem)  MI (myocardial infarction) (HCC) 2012   Migraines    MSSA (methicillin susceptible Staphylococcus aureus) septicemia (HCC) 03/03/2014   a.) TEE (-) for valvular vegitation   Nerve pain    per patient, in the lower back   Osteoarthritis    Pneumonia    PTSD (post-traumatic stress disorder)    PVD (peripheral vascular disease) (HCC)    Tendinitis of left rotator cuff    TIA (transient ischemic attack) 2014    Past Surgical History:  Procedure Laterality Date   ANTERIOR CERVICAL DECOMP/DISCECTOMY FUSION N/A 03/30/2018   Procedure: Cervical six-seven Anterior cervical  decompression/discectomy/fusion;  Surgeon: Jadene Pierini, MD;  Location: [facility] OR;  Service: Neurosurgery;  Laterality: N/A;   ANTERIOR CERVICAL DECOMP/DISCECTOMY FUSION N/A 01/11/2019   Procedure: Cervical Five-Six Anterior cervical discectomy fusion,  Cervical Five to Cervical Seven anterior instrumented fusion;  Surgeon: Jadene Pierini, MD;  Location: [facility] OR;  Service: Neurosurgery;  Laterality: N/A;  Cervical Five-Six Anterior cervical discectomy fusion,  Cervical Five to Cervical Seven anterior instrumented fusion   APPLICATION OF INTRAOPERATIVE CT SCAN N/A 07/10/2023   Procedure: APPLICATION OF INTRAOPERATIVE CT SCAN;  Surgeon: Lovenia Kim, MD;  Location: [facility] ORS;  Service: Neurosurgery;  Laterality: N/A;   BIOPSY  04/21/2023   Procedure: BIOPSY;  Surgeon: Toney Reil, MD;  Location: [facility] ENDOSCOPY;  Service: Gastroenterology;;   CARDIAC CATHETERIZATION Left 02/22/2016   Procedure: Left Heart Cath and Coronary Angiography;  Surgeon: Alwyn Pea, MD;  Location: [facility] INVASIVE CV LAB;  Service: Cardiovascular;  Laterality: Left;   CATARACT EXTRACTION W/ INTRAOCULAR LENS IMPLANT Right    COLONOSCOPY WITH PROPOFOL N/A 09/19/2016   Procedure: COLONOSCOPY WITH PROPOFOL;  Surgeon: [name], MD;  Location: [facility] ENDOSCOPY;  Service: Endoscopy;  Laterality: N/A;   COLONOSCOPY WITH PROPOFOL N/A 04/21/2023   Procedure: COLONOSCOPY WITH PROPOFOL;  Surgeon: Toney Reil, MD;  Location: [facility] ENDOSCOPY;  Service: Gastroenterology;  Laterality: N/A;   DIAGNOSTIC LAPAROSCOPY     ESOPHAGOGASTRODUODENOSCOPY (EGD) WITH PROPOFOL N/A 09/18/2017   Procedure: ESOPHAGOGASTRODUODENOSCOPY (EGD) WITH PROPOFOL;  Surgeon: [name], MD;  Location: [facility] ENDOSCOPY;  Service: Gastroenterology;  Laterality: N/A;   ESOPHAGOGASTRODUODENOSCOPY (EGD) WITH PROPOFOL N/A 08/10/2019   Procedure: ESOPHAGOGASTRODUODENOSCOPY (EGD) WITH PROPOFOL;  Surgeon: [name], MD;  Location: [facility] ENDOSCOPY;   Service: Endoscopy;  Laterality: N/A;   ESOPHAGOGASTRODUODENOSCOPY (EGD) WITH PROPOFOL N/A 04/21/2023   Procedure: ESOPHAGOGASTRODUODENOSCOPY (EGD) WITH PROPOFOL;  Surgeon: Toney Reil, MD;  Location: [facility] ENDOSCOPY;  Service: Gastroenterology;  Laterality: N/A;   HEMORRHOID SURGERY     LEFT HEART CATH AND CORONARY ANGIOGRAPHY Left 07/07/2014   LEFT HEART CATH AND CORONARY ANGIOGRAPHY Left 2011   LUMBAR WOUND DEBRIDEMENT N/A 11/05/2022   Procedure: Thoracic one spinous process removal and wound revision;  Surgeon: Jadene Pierini, MD;  Location: [facility] OR;  Service: Neurosurgery;  Laterality: N/A;   NASAL SINUS SURGERY     ORIF FEMUR FRACTURE Left    ORIF TIBIA & FIBULA FRACTURES Right    OVARIAN CYST SURGERY     POSTERIOR CERVICAL FUSION/FORAMINOTOMY N/A 05/14/2021   Procedure: Cervical five, Cervical six Laminectomy,foraminotomy with Cervical five-six Cervical six-seven Cervical seven-Thoracic one Posterior instrumented fusion;  Surgeon: Jadene Pierini, MD;  Location: [facility] OR;  Service: Neurosurgery;  Laterality: N/A;   SHOULDER ARTHROSCOPY WITH SUBACROMIAL DECOMPRESSION, ROTATOR CUFF REPAIR AND BICEP TENDON REPAIR Left 05/28/2022   Procedure: SHOULDER ARTHROSCOPY WITH DEBRIDEMENT, DECOMPRESSION, ROTATOR CUFF REPAIR AND BICEP TENDON REPAIR;  Surgeon: Christena Flake,  MD;  Location: [facility] ORS;  Service: Orthopedics;  Laterality: Left;   SHOULDER ARTHROSCOPY WITH SUBACROMIAL DECOMPRESSION, ROTATOR CUFF REPAIR AND BICEP TENDON REPAIR Left 12/03/2022   Procedure: SHOULDER Extensive ARTHROSCOPY WITH DEBRIDEMENT, removal of Retained Foreign Body;  Surgeon: Christena Flake, MD;  Location: [facility] ORS;  Service: Orthopedics;  Laterality: Left;   SHOULDER ARTHROSCOPY WITH SUBACROMIAL DECOMPRESSION, ROTATOR CUFF REPAIR AND BICEP TENDON REPAIR Right 03/11/2023   Procedure: RIGHT SHOULDER ARTHROSCOPY WITH DEBRIDMENT, DECOMPRESSION and BICEPS TENODESIS.;  Surgeon: Christena Flake, MD;  Location: [facility] ORS;   Service: Orthopedics;  Laterality: Right;   THORACIC DISCECTOMY N/A 07/10/2023   Procedure: T8-9 & T9-10 TRANSPEDICULAR DECOMPRESSION/DISCECTOMY;  Surgeon: Lovenia Kim, MD;  Location: [facility] ORS;  Service: Neurosurgery;  Laterality: N/A;   TRANSFORAMINAL LUMBAR INTERBODY FUSION W/ MIS 1 LEVEL Right 05/23/2020   Procedure: Right Lumbar Five Sacral One Minimally invasive transforaminal lumbar interbody fusion;  Surgeon: Jadene Pierini, MD;  Location: [facility] OR;  Service: Neurosurgery;  Laterality: Right;  Right Lumbar Five Sacral One Minimally invasive transforaminal lumbar interbody fusion     Current Facility-Administered Medications:    acetaminophen (TYLENOL) tablet 650 mg, 650 mg, Oral, Q6H PRN, 650 mg at 09/13/23 2045 **OR** acetaminophen (TYLENOL) suppository 650 mg, 650 mg, Rectal, Q6H PRN, Mansy, Jan A, MD   albuterol (PROVENTIL) (2.5 MG/3ML) 0.083% nebulizer solution 2.5 mg, 2.5 mg, Nebulization, Q4H PRN, Otelia Sergeant, RPH, 2.5 mg at 09/14/23 1307   [START ON 09/15/2023] amLODipine (NORVASC) tablet 5 mg, 5 mg, Oral, Daily, Gillis Santa, MD   atorvastatin (LIPITOR) tablet 40 mg, 40 mg, Oral, QPM, Mansy, Jan A, MD, 40 mg at 09/13/23 1642   cyanocobalamin (VITAMIN B12) injection 1,000 mcg, 1,000 mcg, Intramuscular, Daily, 1,000 mcg at 09/14/23 0929 **FOLLOWED BY** [START ON 09/16/2023] cyanocobalamin (VITAMIN B12) tablet 1,000 mcg, 1,000 mcg, Oral, Daily, Gillis Santa, MD   docusate sodium (COLACE) capsule 100 mg, 100 mg, Oral, Daily, Mansy, Jan A, MD, 100 mg at 09/14/23 0920   gabapentin (NEURONTIN) capsule 600 mg, 600 mg, Oral, QID, Mansy, Jan A, MD, 600 mg at 09/14/23 1343   iron sucrose (VENOFER) 300 mg in sodium chloride 0.9 % 250 mL IVPB, 300 mg, Intravenous, Q24H, Marolyn Haller, MD, Stopped at 09/14/23 1135   [START ON 09/15/2023] isosorbide mononitrate (IMDUR) 24 hr tablet 15 mg, 15 mg, Oral, Daily, Gillis Santa, MD   linaclotide Karlene Einstein) capsule 290 mcg, 290 mcg, Oral,  Daily PRN, Mansy, Jan A, MD   lisinopril (ZESTRIL) tablet 20 mg, 20 mg, Oral, Daily, Mansy, Jan A, MD, 20 mg at 09/14/23 0919   magnesium citrate solution 1 Bottle, 1 Bottle, Oral, Once, Aysha Livecchi, Loel Dubonnet, MD   magnesium hydroxide (MILK OF MAGNESIA) suspension 30 mL, 30 mL, Oral, Daily PRN, Mansy, Jan A, MD   methocarbamol (ROBAXIN) tablet 750 mg, 750 mg, Oral, Q6H PRN, Mansy, Jan A, MD, 750 mg at 09/13/23 2048   [START ON 09/15/2023] metoprolol tartrate (LOPRESSOR) tablet 12.5 mg, 12.5 mg, Oral, BID, Gillis Santa, MD   nitroGLYCERIN (NITROSTAT) SL tablet 0.4 mg, 0.4 mg, Sublingual, Q5 min PRN, Mansy, Jan A, MD   ondansetron (ZOFRAN) tablet 4 mg, 4 mg, Oral, Q6H PRN **OR** ondansetron (ZOFRAN) injection 4 mg, 4 mg, Intravenous, Q6H PRN, Mansy, Jan A, MD   oxyCODONE (Oxy IR/ROXICODONE) immediate release tablet 15 mg, 15 mg, Oral, Q4H, Mansy, Jan A, MD, 15 mg at 09/14/23 1252   pantoprazole (PROTONIX) injection 40 mg, 40 mg, Intravenous, Q12H, Marolyn Haller,  MD, 40 mg at 09/14/23 5366   phosphorus (K PHOS NEUTRAL) tablet 500 mg, 500 mg, Oral, QID, Gillis Santa, MD, 500 mg at 09/14/23 1458   polyethylene glycol powder (GLYCOLAX/MIRALAX) container 238 g, 238 g, Oral, Once, Nidya Bouyer, Loel Dubonnet, MD   sucralfate (CARAFATE) 1 GM/10ML suspension 1 g, 1 g, Oral, QID, Mansy, Jan A, MD, 1 g at 09/14/23 1343   traZODone (DESYREL) tablet 25 mg, 25 mg, Oral, QHS PRN, Mansy, Jan A, MD   umeclidinium bromide (INCRUSE ELLIPTA) 62.5 MCG/ACT 1 puff, 1 puff, Inhalation, Daily, Mansy, Jan A, MD, 1 puff at 09/14/23 0800   Family History  Problem Relation Age of Onset   Heart disease Mother    Lung cancer Mother    Ovarian cancer Mother    Healthy Brother    Healthy Brother    Healthy Brother    Diabetes Maternal Uncle    Breast cancer Maternal Aunt      Social History   Tobacco Use   Smoking status: Some Days    Current packs/day: 0.50    Average packs/day: 0.5 packs/day for 35.0 years (17.5 ttl  pk-yrs)    Types: Cigarettes   Smokeless tobacco: Never  Vaping Use   Vaping status: Former   Quit date: 06/24/2013  Substance Use Topics   Alcohol use: Not Currently    Alcohol/week: 0.0 standard drinks of alcohol   Drug use: Not Currently    Allergies as of 09/12/2023 - Review Complete 09/12/2023  Allergen Reaction Noted   Cyclobenzaprine Hives, Swelling, and Rash 11/13/2013   Levofloxacin Hives, Swelling, and Rash 02/27/2012   Chlorpheniramine maleate  02/12/2022   Phenergan [promethazine hcl] Other (See Comments) 02/21/2017   Phenylephrine hcl Other (See Comments) 02/12/2022   Toradol [ketorolac tromethamine] Swelling and Other (See Comments) 01/17/2015   Zoloft [sertraline hcl] Swelling 05/31/2015   Cephalexin Hives and Rash 10/22/2010   Ketorolac Rash 10/22/2010   Tramadol Hives, Swelling, Other (See Comments), and Rash 01/17/2015    Review of Systems:    All systems reviewed and negative except where noted in HPI.   Physical Exam:  Vital signs in last 24 hours: Temp:  [97.7 F (36.5 C)-98.1 F (36.7 C)] 98 F (36.7 C) (03/23 0930) Pulse Rate:  [53-71] 71 (03/23 0930) Resp:  [16-20] 17 (03/23 0930) BP: (98-139)/(56-72) 139/72 (03/23 0930) SpO2:  [91 %-99 %] 91 % (03/23 0930) Last BM Date : 09/12/23 General:   Pleasant, cooperative in NAD Head:  Normocephalic and atraumatic. Eyes:   No icterus.   Conjunctiva pink. PERRLA. Ears:  Normal auditory acuity. Neck:  Supple; no masses or thyroidomegaly Lungs: Respirations even and unlabored. Lungs clear to auscultation bilaterally.   No wheezes, crackles, or rhonchi.  Heart:  Regular rate and rhythm;  Without murmur, clicks, rubs or gallops Abdomen:  Soft, nondistended, nontender. Normal bowel sounds. No appreciable masses or hepatomegaly.  No rebound or guarding.  Rectal:  Not performed. Msk:  Symmetrical without gross deformities.  Strength generalized weakness Extremities:  Without edema, cyanosis or  clubbing. Neurologic:  Alert and oriented x3;  grossly normal neurologically. Skin:  Intact without significant lesions or rashes. Psych:  Alert and cooperative. Normal affect.  LAB RESULTS:    Latest Ref Rng & Units 09/14/2023    4:26 AM 09/13/2023    4:15 AM 09/12/2023    7:07 PM  CBC  WBC 4.0 - 10.5 K/uL 8.0  6.1  7.1   Hemoglobin 12.0 - 15.0 g/dL 7.7  8.1  5.8   Hematocrit 36.0 - 46.0 % 25.9  27.0  20.7   Platelets 150 - 400 K/uL 149  158  174     BMET    Latest Ref Rng & Units 09/14/2023    4:26 AM 09/13/2023    4:15 AM 09/12/2023    7:07 PM  BMP  Glucose 70 - 99 mg/dL 86  161  71   BUN 6 - 20 mg/dL 17  18  21    Creatinine 0.44 - 1.00 mg/dL 0.96  0.45  4.09   Sodium 135 - 145 mmol/L 138  136  135   Potassium 3.5 - 5.1 mmol/L 3.4  3.9  3.8   Chloride 98 - 111 mmol/L 108  104  102   CO2 22 - 32 mmol/L 26  23  24    Calcium 8.9 - 10.3 mg/dL 8.3  8.8  8.7     LFT    Latest Ref Rng & Units 09/13/2023    4:15 AM 01/06/2023    2:32 PM 10/30/2022    1:11 PM  Hepatic Function  Total Protein 6.5 - 8.1 g/dL 6.5  6.5  6.5   Albumin 3.5 - 5.0 g/dL 3.5  4.0  3.6   AST 15 - 41 U/L 18  15  20    ALT 0 - 44 U/L 13  10  15    Alk Phosphatase 38 - 126 U/L 66  85  73   Total Bilirubin 0.0 - 1.2 mg/dL 0.9  <8.1  0.4   Bilirubin, Direct 0.0 - 0.2 mg/dL 0.1        STUDIES: CT Angio Chest PE W/Cm &/Or Wo Cm Result Date: 09/12/2023 CLINICAL DATA:  Pulmonary embolism (PE) suspected, high prob sent by PCP for productive cough and SHOB x1 week. Pt c/o decreased appetite, nausea, and weakness. Pt has COPD- pt does not use oxygen at home. Pt AOX4, EXAM: CT ANGIOGRAPHY CHEST WITH CONTRAST TECHNIQUE: Multidetector CT imaging of the chest was performed using the standard protocol during bolus administration of intravenous contrast. Multiplanar CT image reconstructions and MIPs were obtained to evaluate the vascular anatomy. RADIATION DOSE REDUCTION: This exam was performed according to the departmental  dose-optimization program which includes automated exposure control, adjustment of the mA and/or kV according to patient size and/or use of iterative reconstruction technique. CONTRAST:  75mL OMNIPAQUE IOHEXOL 350 MG/ML SOLN COMPARISON:  CT heart 10/28/2022, CT angio chest 03/27/2016 FINDINGS: Cardiovascular: Satisfactory opacification of the pulmonary arteries to the segmental level. No evidence of pulmonary embolism. The main pulmonary artery is normal in caliber. Normal heart size. No significant pericardial effusion. The thoracic aorta is normal in caliber. Moderate atherosclerotic plaque of the thoracic aorta. Four-vessel coronary artery calcifications. Mitral annular calcification. Mediastinum/Nodes: Prominent nonenlarged prevascular lymph nodes. No enlarged mediastinal, hilar, or axillary lymph nodes. Thyroid gland, trachea, and esophagus demonstrate no significant findings. Lungs/Pleura: Bilateral lower lobe atelectasis. No focal consolidation. Chronic right upper lobe cluster pulmonary micronodules (7:67; 5:32-42)) -no further follow-up indicated. No pulmonary mass. No pleural effusion. No pneumothorax. Upper Abdomen: Prominent but nonenlarged upper abdominal lymph nodes. No acute abnormality. Musculoskeletal: No chest wall abnormality. No suspicious lytic or blastic osseous lesions. No acute displaced fracture. CT8-T11 posterolateral surgical hardware. No CT evidence of surgical hardware complication. Review of the MIP images confirms the above findings. IMPRESSION: 1. No pulmonary embolus. 2. No acute intrathoracic abnormality. 3. Aortic Atherosclerosis (ICD10-I70.0) including four-vessel coronary calcification and mitral annular calcification. 4. Prominent but nonenlarged prevascular mediastinal and upper  abdominal lymph nodes. Recommend attention on follow-up. Electronically Signed   By: Tish Frederickson M.D.   On: 09/12/2023 23:12   DG Chest 2 View Result Date: 09/12/2023 CLINICAL DATA:  COPD EXAM:  CHEST - 2 VIEW COMPARISON:  Chest x-ray 11/17/2019 FINDINGS: The heart and mediastinal contours are unchanged. Question rounded density overlying the right lower lobe and ribs. No pulmonary edema. Trace to small right pleural effusion. No right pleural effusion. No pneumothorax. No acute osseous abnormality. Cervical spine surgical hardware. Lower thoracic spine surgical hardware. IMPRESSION: 1. Trace to small right pleural effusion. 2. Question rounded density overlying the right lower lobe and ribs. 3. Followup PA and lateral chest X-ray is recommended in 3-4 weeks to ensure resolution. Electronically Signed   By: Tish Frederickson M.D.   On: 09/12/2023 20:02      Impression / Plan:   Sara Munoz is a 58 y.o. female with history of hep C, genotype 3, s/p treatment, attained SVR, COPD, chronic constipation, known history of chronic iron deficiency anemia in the setting of frequent BC powder use, intermittent melena presented with worsening of iron deficiency anemia  Acute on chronic iron deficiency anemia with intermittent melena in setting of BC powder use S/p 2 units of PRBCs with appropriate response Continue Protonix 40 mg p.o. twice daily There was no obvious source of iron deficiency anemia identified based on upper endoscopy and colonoscopy in the past.  There was no evidence of peptic ulcer disease, H. pylori infection other than gastric erosions Recommend repeat EGD and colonoscopy after she is fully clean as inpatient.  If no clear source identified, recommend video capsule endoscopy as well because patient is at risk for NSAID induced enteropathy which can also lead to worsening of iron deficiency anemia Recommend to start MiraLAX bowel prep today, another prep ordered in case if she is not clean along with a bottle of magnesium citrate Continue clear liquid diet today N.p.o. effective 5 AM tomorrow Agree with parenteral iron therapy Completely stop using NSAIDs, BC powder, Goody  powder   Thank you for involving me in the care of this patient.  Dr. Servando Snare will cover from tomorrow    LOS: 1 day   Lannette Donath, MD  09/14/2023, 4:00 PM    Note: This dictation was prepared with Dragon dictation along with smaller phrase technology. Any transcriptional errors that result from this process are unintentional.

## 2023-09-14 NOTE — Progress Notes (Addendum)
 Progress Note Not for billing purposes.   Patient: Sara Munoz HKV:[id number] DOB: Jun 08, 1966 DOA: 09/12/2023     1 DOS: the patient was seen and examined on 09/14/2023   Brief hospital course: Erythematous mucosa in the gastric body and antrum. Biopsied. 10/24 upper endocscopy. Negative for h pylori. Negative celiacs.   Colonoscopy poor prep  Assessment and Plan: * Symptomatic anemia Hemoglobin responded appropriately to 2 units.  Patient was worked up for iron deficiency anemia in the outpatient setting around 03/2023.  She underwent a EGD and colonoscopy.  EGD was notable for erythematous mucosa of the stomach with pathology negative for H. pylori.  Colonoscopy was limited due to poor prep.  Patient was to return in 3 months and continue iron supplementation as well as stop BC powder use.  Patient reports she continues to use BC powder about 3 times a day.  She is not currently taking oral iron supplementation.  She notes that she has black stools occasionally, the last of which was about 2 weeks ago.  Discussed case with GI who recommended IV PPI, clear liquid diet, and likely EGD/colonoscopy. Continue pantoprazole 40 mg IV twice daily 3/23 Hb 7.7 continue to trend H&H Follow GI for further recommendation  # Iron deficiency, transferrin saturation 4% Continue Venofer 3 mg IV daily x 4 doses, started 3/22 # Vitamin B12 level 317, goal >400, started vitamin B12 1000 mcg IM injection x 2 doses followed by oral supplement.   Hypokalemia, mild, Hypophosphatemia due to nutritional deficiency, started oral Neutraphos Monitor electrolytes and replete as needed   Essential hypertension - Continue home antihypertensive therapy. 3/23 BP was soft in the morning, skipped a.m. dose of antihypertensive medications Monitor BP and titrate medications accordingly   H/o CAD, TIA, PVD  Dyslipidemia - Continue home statin therapy. Hold aspirin for now  GERD without esophagitis - Continue PPI  therapy as well as Carafate.  Chronic obstructive pulmonary disease (COPD) (HCC) Is not in acute exacerbation, continue her inhalers.  Peripheral neuropathy - Continue home Neurontin.  S/p thoracic decompression and fusion  On chronic opioid theapy       Subjective: No significant events overnight, epigastric discomfort 6/10, has chronic backache due to recent surgery.  Denied any other complaints, no chest pain palpitation, no shortness of breath. Denies any GI bleeding or dark stools.   Physical Exam: Vitals:   09/13/23 1803 09/14/23 0017 09/14/23 0440 09/14/23 0930  BP: 119/61 (!) 98/56 111/62 139/72  Pulse: 67 (!) 53 62 71  Resp:  20 16 17   Temp: 98.1 F (36.7 C) 97.9 F (36.6 C) 97.7 F (36.5 C) 98 F (36.7 C)  TempSrc: Oral  Oral   SpO2: 95% 94% 95% 91%  Weight:      Height:        Constitutional: In no distress.  Cardiovascular: Normal rate, regular rhythm. No lower extremity edema  Pulmonary: Non labored breathing on room air, no wheezing or rales.  Abdominal: Soft. Normal bowel sounds. Non distended and mild epigastric tenderness.  Musculoskeletal: Normal range of motion.     Neurological: Alert and oriented to person, place, and time. Non focal  Skin: Skin is warm and dry.    Data Reviewed:     Latest Ref Rng & Units 09/14/2023    4:26 AM 09/13/2023    4:15 AM 09/12/2023    7:07 PM  CBC  WBC 4.0 - 10.5 K/uL 8.0  6.1  7.1   Hemoglobin 12.0 - 15.0 g/dL 7.7  8.1  5.8   Hematocrit 36.0 - 46.0 % 25.9  27.0  20.7   Platelets 150 - 400 K/uL 149  158  174       Latest Ref Rng & Units 09/14/2023    4:26 AM 09/13/2023    4:15 AM 09/12/2023    7:07 PM  BMP  Glucose 70 - 99 mg/dL 86  161  71   BUN 6 - 20 mg/dL 17  18  21    Creatinine 0.44 - 1.00 mg/dL 0.96  0.45  4.09   Sodium 135 - 145 mmol/L 138  136  135   Potassium 3.5 - 5.1 mmol/L 3.4  3.9  3.8   Chloride 98 - 111 mmol/L 108  104  102   CO2 22 - 32 mmol/L 26  23  24    Calcium 8.9 - 10.3 mg/dL 8.3  8.8   8.7      Family Communication: None   Disposition: Status is: Inpatient Remains inpatient appropriate because: further work up of her  Planned Discharge Destination: Home    Time spent: 35 minutes  Author: Gillis Santa, MD 09/14/2023 1:29 PM  For on call review [email or web address].

## 2023-09-14 NOTE — Plan of Care (Signed)
   Problem: Education: Goal: Knowledge of General Education information will improve Description Including pain rating scale, medication(s)/side effects and non-pharmacologic comfort measures Outcome: Progressing   Problem: Clinical Measurements: Goal: Ability to maintain clinical measurements within normal limits will improve Outcome: Progressing   Problem: Activity: Goal: Risk for activity intolerance will decrease Outcome: Progressing

## 2023-09-15 ENCOUNTER — Encounter: Payer: Self-pay | Admitting: Gastroenterology

## 2023-09-15 ENCOUNTER — Inpatient Hospital Stay: Admitting: Anesthesiology

## 2023-09-15 ENCOUNTER — Encounter
Admission: EM | Disposition: A | Discharge status: Home / Self Care | Payer: Self-pay | Source: Home / Self Care | Attending: Student

## 2023-09-15 ENCOUNTER — Institutional Professional Consult (permissible substitution): Admitting: Pulmonary Disease

## 2023-09-15 DIAGNOSIS — D509 Iron deficiency anemia, unspecified: Secondary | ICD-10-CM

## 2023-09-15 DIAGNOSIS — D649 Anemia, unspecified: Secondary | ICD-10-CM | POA: Diagnosis not present

## 2023-09-15 DIAGNOSIS — K640 First degree hemorrhoids: Secondary | ICD-10-CM

## 2023-09-15 HISTORY — PX: GIVENS CAPSULE STUDY: SHX5432

## 2023-09-15 HISTORY — PX: COLONOSCOPY: SHX5424

## 2023-09-15 HISTORY — PX: ESOPHAGOGASTRODUODENOSCOPY: SHX5428

## 2023-09-15 LAB — MAGNESIUM: Magnesium: 1.7 mg/dL (ref 1.7–2.4)

## 2023-09-15 LAB — CBC
HCT: 27.8 % — ABNORMAL LOW (ref 36.0–46.0)
Hemoglobin: 8.2 g/dL — ABNORMAL LOW (ref 12.0–15.0)
MCH: 20.9 pg — ABNORMAL LOW (ref 26.0–34.0)
MCHC: 29.5 g/dL — ABNORMAL LOW (ref 30.0–36.0)
MCV: 70.9 fL — ABNORMAL LOW (ref 80.0–100.0)
Platelets: 182 10*3/uL (ref 150–400)
RBC: 3.92 MIL/uL (ref 3.87–5.11)
RDW: 22.5 % — ABNORMAL HIGH (ref 11.5–15.5)
WBC: 8.4 10*3/uL (ref 4.0–10.5)
nRBC: 1.8 % — ABNORMAL HIGH (ref 0.0–0.2)

## 2023-09-15 LAB — BASIC METABOLIC PANEL
Anion gap: 9 (ref 5–15)
BUN: 9 mg/dL (ref 6–20)
CO2: 27 mmol/L (ref 22–32)
Calcium: 8.5 mg/dL — ABNORMAL LOW (ref 8.9–10.3)
Chloride: 106 mmol/L (ref 98–111)
Creatinine, Ser: 0.8 mg/dL (ref 0.44–1.00)
GFR, Estimated: >60 mL/min (ref >60)
Glucose, Bld: 83 mg/dL (ref 70–99)
Potassium: 3.1 mmol/L — ABNORMAL LOW (ref 3.5–5.1)
Sodium: 142 mmol/L (ref 135–145)

## 2023-09-15 LAB — PHOSPHORUS: Phosphorus: 4 mg/dL (ref 2.5–4.6)

## 2023-09-15 SURGERY — EGD (ESOPHAGOGASTRODUODENOSCOPY)
Anesthesia: General

## 2023-09-15 MED ORDER — SODIUM CHLORIDE 0.9% FLUSH: 3.0000 mL | INTRAVENOUS | Status: DC | PRN

## 2023-09-15 MED ORDER — STERILE WATER FOR IRRIGATION IR SOLN
Status: DC | PRN
  Administered 2023-09-15: 60 mL

## 2023-09-15 MED ORDER — POTASSIUM CHLORIDE CRYS ER 20 MEQ PO TBCR: 40.0000 meq | EXTENDED_RELEASE_TABLET | Freq: Once | ORAL | Status: DC

## 2023-09-15 MED ORDER — SODIUM CHLORIDE 0.9 % IV SOLN: INTRAVENOUS | Status: DC

## 2023-09-15 MED ORDER — POTASSIUM CHLORIDE 10 MEQ/100ML IV SOLN: 10.0000 meq | INTRAVENOUS | Status: DC

## 2023-09-15 MED ORDER — POTASSIUM CHLORIDE 10 MEQ/100ML IV SOLN
10.0000 meq | INTRAVENOUS | Status: DC
  Administered 2023-09-15 (×2): 10 meq via INTRAVENOUS
  Filled 2023-09-15 (×2): qty 100

## 2023-09-15 MED ORDER — MIDAZOLAM HCL 2 MG/2ML IJ SOLN
INTRAMUSCULAR | Status: DC | PRN
  Administered 2023-09-15: 2 mg via INTRAVENOUS

## 2023-09-15 MED ORDER — SODIUM CHLORIDE 0.9 % IV SOLN: INTRAVENOUS | Status: DC | PRN

## 2023-09-15 MED ORDER — LIDOCAINE HCL (CARDIAC) PF 100 MG/5ML IV SOSY
PREFILLED_SYRINGE | INTRAVENOUS | Status: DC | PRN
  Administered 2023-09-15: 100 mg via INTRAVENOUS

## 2023-09-15 MED ORDER — MIDAZOLAM HCL 2 MG/2ML IJ SOLN
INTRAMUSCULAR | Status: AC
  Filled 2023-09-15: qty 2

## 2023-09-15 MED ORDER — PROPOFOL 10 MG/ML IV BOLUS
INTRAVENOUS | Status: DC | PRN
  Administered 2023-09-15: 60 mg via INTRAVENOUS
  Administered 2023-09-15: 20 mg via INTRAVENOUS

## 2023-09-15 MED ORDER — GLYCOPYRROLATE 0.2 MG/ML IJ SOLN
INTRAMUSCULAR | Status: DC | PRN
  Administered 2023-09-15: .2 mg via INTRAVENOUS

## 2023-09-15 MED ORDER — POTASSIUM CHLORIDE CRYS ER 20 MEQ PO TBCR
40.0000 meq | EXTENDED_RELEASE_TABLET | ORAL | Status: AC
  Administered 2023-09-15 (×2): 40 meq via ORAL
  Filled 2023-09-15 (×2): qty 2

## 2023-09-15 MED ORDER — SODIUM CHLORIDE 0.9% FLUSH
3.0000 mL | Freq: Two times a day (BID) | INTRAVENOUS | Status: DC
  Administered 2023-09-15: 3 mL via INTRAVENOUS
  Administered 2023-09-15: 10 mL via INTRAVENOUS

## 2023-09-15 MED ORDER — PROPOFOL 500 MG/50ML IV EMUL
INTRAVENOUS | Status: DC | PRN
  Administered 2023-09-15: 155 ug/kg/min via INTRAVENOUS

## 2023-09-15 NOTE — Anesthesia Postprocedure Evaluation (Signed)
 Anesthesia Post Note  Patient: Joy R Lavell  Procedure(s) Performed: EGD (ESOPHAGOGASTRODUODENOSCOPY) (Left) COLONOSCOPY (Left) IMAGING PROCEDURE, GI TRACT, INTRALUMINAL, VIA CAPSULE  Patient location during evaluation: Endoscopy Anesthesia Type: General Level of consciousness: awake and alert Pain management: pain level controlled Vital Signs Assessment: post-procedure vital signs reviewed and stable Respiratory status: spontaneous breathing, nonlabored ventilation, respiratory function stable and patient connected to nasal cannula oxygen Cardiovascular status: blood pressure returned to baseline and stable Postop Assessment: no apparent nausea or vomiting Anesthetic complications: no   No notable events documented.   Last Vitals:  Vitals:   09/15/23 1305 09/15/23 1315  BP: 130/66 133/62  Pulse:    Resp:    Temp:    SpO2: 93%     Last Pain:  Vitals:   09/15/23 1315  TempSrc:   PainSc: 0-No pain                 Cleda Mccreedy Lamis Behrmann

## 2023-09-15 NOTE — Progress Notes (Signed)
 Progress Note Not for billing purposes.   Patient: Sara Munoz EAV:[id number] DOB: 02-24-1966 DOA: 09/12/2023     2 DOS: the patient was seen and examined on 09/15/2023   Brief hospital course: Erythematous mucosa in the gastric body and antrum. Biopsied. 10/24 upper endocscopy. Negative for h pylori. Negative celiacs.   Colonoscopy poor prep  Assessment and Plan: * Symptomatic anemia Hemoglobin responded appropriately to 2 units.  Patient was worked up for iron deficiency anemia in the outpatient setting around 03/2023.  She underwent a EGD and colonoscopy.  EGD was notable for erythematous mucosa of the stomach with pathology negative for H. pylori.  Colonoscopy was limited due to poor prep.  Patient was to return in 3 months and continue iron supplementation as well as stop BC powder use.  Patient reports she continues to use BC powder about 3 times a day.  She is not currently taking oral iron supplementation.  She notes that she has black stools occasionally, the last of which was about 2 weeks ago.  Discussed case with GI who recommended IV PPI, clear liquid diet, and likely EGD/colonoscopy. Continue pantoprazole 40 mg IV twice daily 3/23 Hb 7.7--->8.2 today on 3/24 continue to trend H&H 3/24 s/p EGD: Normal, no specimen collected. S/p colonoscopy: Nonbleeding internal hemorrhoids, no specimens collected.   # Iron deficiency, transferrin saturation 4% Continue Venofer 3 mg IV daily x 4 doses, started 3/22 # Vitamin B12 level 317, goal >400, started vitamin B12 1000 mcg IM injection x 2 doses followed by oral supplement.   Hypokalemia, potassium repleted IV followed by oral Hypophosphatemia due to nutritional deficiency, resolved s/p oral Neutraphos Monitor electrolytes and replete as needed   Essential hypertension - Continue home antihypertensive therapy. 3/23 BP was soft in the morning, skipped a.m. dose of antihypertensive medications Monitor BP and titrate medications  accordingly   H/o CAD, TIA, PVD  Dyslipidemia - Continue home statin therapy. Hold aspirin for now  GERD without esophagitis - Continue PPI therapy as well as Carafate.  Chronic obstructive pulmonary disease (COPD) (HCC) Is not in acute exacerbation, continue her inhalers.  Peripheral neuropathy - Continue home Neurontin.  S/p thoracic decompression and fusion  On chronic opioid theapy       Subjective: No significant events overnight, patient was getting IV potassium and had burning at the IV site, we will try to give her slowly.  Denied any other complaints, patient was awaiting for EGD and colonoscopy.   Physical Exam: Vitals:   09/15/23 1245 09/15/23 1255 09/15/23 1305 09/15/23 1315  BP: (!) 96/44  130/66 133/62  Pulse:      Resp:      Temp: 98.3 F (36.8 C)     TempSrc: Temporal     SpO2: 92% 93% 93%   Weight:      Height:        Constitutional: In no distress.  Cardiovascular: Normal rate, regular rhythm. No lower extremity edema  Pulmonary: Non labored breathing on room air, no wheezing or rales.  Abdominal: Soft. Normal bowel sounds. Non distended and mild epigastric tenderness.  Musculoskeletal: Normal range of motion.     Neurological: Alert and oriented to person, place, and time. Non focal  Skin: Skin is warm and dry.    Data Reviewed:     Latest Ref Rng & Units 09/15/2023    4:23 AM 09/14/2023    4:09 PM 09/14/2023    4:26 AM  CBC  WBC 4.0 - 10.5 K/uL 8.4  8.0   Hemoglobin 12.0 - 15.0 g/dL 8.2  8.1  7.7   Hematocrit 36.0 - 46.0 % 27.8  27.3  25.9   Platelets 150 - 400 K/uL 182   149       Latest Ref Rng & Units 09/15/2023    4:23 AM 09/14/2023    4:26 AM 09/13/2023    4:15 AM  BMP  Glucose 70 - 99 mg/dL 83  86  540   BUN 6 - 20 mg/dL 9  17  18    Creatinine 0.44 - 1.00 mg/dL 9.81  1.91  4.78   Sodium 135 - 145 mmol/L 142  138  136   Potassium 3.5 - 5.1 mmol/L 3.1  3.4  3.9   Chloride 98 - 111 mmol/L 106  108  104   CO2 22 - 32 mmol/L 27   26  23    Calcium 8.9 - 10.3 mg/dL 8.5  8.3  8.8      Family Communication: None   Disposition: Status is: Inpatient Remains inpatient appropriate because: further work up of her  Planned Discharge Destination: Home   Most likely discharge tomorrow a.m. if remains stable.  Time spent: 35 minutes  Author: Gillis Santa, MD 09/15/2023 1:33 PM  For on call review [email or web address].

## 2023-09-15 NOTE — Transfer of Care (Signed)
 Immediate Anesthesia Transfer of Care Note  Patient: Mava R Haberland  Procedure(s) Performed: EGD (ESOPHAGOGASTRODUODENOSCOPY) (Left) COLONOSCOPY (Left) IMAGING PROCEDURE, GI TRACT, INTRALUMINAL, VIA CAPSULE  Patient Location: Endoscopy Unit  Anesthesia Type:General  Level of Consciousness: drowsy and patient cooperative  Airway & Oxygen Therapy: Patient Spontanous Breathing and Patient connected to nasal cannula oxygen  Post-op Assessment: Report given to RN and Post -op Vital signs reviewed and stable  Post vital signs: Reviewed and stable  Last Vitals:  Vitals Value Taken Time  BP 96/44 09/15/23 1245  Temp 36.8 C 09/15/23 1245  Pulse 66 09/15/23 1247  Resp 16 09/15/23 1247  SpO2 92 % 09/15/23 1247  Vitals shown include unfiled device data.  Last Pain:  Vitals:   09/15/23 1245  TempSrc: Temporal  PainSc:       Patients Stated Pain Goal: 5 (09/15/23 0413)  Complications: No notable events documented.

## 2023-09-15 NOTE — Op Note (Signed)
 [facility] Patient Name: Sara Munoz Procedure Date: 09/15/2023 12:08 PM MRN: [id number] Account #: [id number] Date of Birth: 26-Aug-1965 Admit Type: Inpatient Age: 58 Room: [facility] ENDO ROOM 4 Gender: Female Note Status: Finalized Instrument Name: Patton Salles Endoscope [id number] Procedure:             Upper GI endoscopy Indications:           Iron deficiency anemia Providers:             Midge Minium MD, MD Referring MD:          [name] Medicines:             Propofol per Anesthesia Complications:         No immediate complications. Procedure:             Pre-Anesthesia Assessment:                        - Prior to the procedure, a History and Physical was                         performed, and patient medications and allergies were                         reviewed. The patient's tolerance of previous                         anesthesia was also reviewed. The risks and benefits                         of the procedure and the sedation options and risks                         were discussed with the patient. All questions were                         answered, and informed consent was obtained. Prior                         Anticoagulants: The patient has taken no anticoagulant                         or antiplatelet agents. ASA Grade Assessment: II - A                         patient with mild systemic disease. After reviewing                         the risks and benefits, the patient was deemed in                         satisfactory condition to undergo the procedure.                        After obtaining informed consent, the endoscope was                         passed under direct vision. Throughout the procedure,  the patient's blood pressure, pulse, and oxygen                         saturations were monitored continuously. The Endoscope                         was introduced through the mouth, and advanced to the                          second part of duodenum. The upper GI endoscopy was                         accomplished without difficulty. The patient tolerated                         the procedure well. Findings:      The examined esophagus was normal.      The entire examined stomach was normal.      The examined duodenum was normal. Impression:            - Normal esophagus.                        - Normal stomach.                        - Normal examined duodenum.                        - No specimens collected. Recommendation:        - Return patient to hospital ward for ongoing care.                        - Resume previous diet.                        - Continue present medications.                        - Perform a colonoscopy today. Procedure Code(s):     --- Professional ---                        [phone], Esophagogastroduodenoscopy, flexible,                         transoral; diagnostic, including collection of                         specimen(s) by brushing or washing, when performed                         (separate procedure) Diagnosis Code(s):     --- Professional ---                        D50.9, Iron deficiency anemia, unspecified CPT copyright 2022 American Medical Association. All rights reserved. The codes documented in this report are preliminary and upon coder review may  be revised to meet current compliance requirements. Midge Minium MD, MD 09/15/2023 12:29:01 PM This report has been signed electronically. Number of Addenda: 0 Note Initiated On: 09/15/2023 12:08 PM Estimated Blood  Loss:  Estimated blood loss: none.      [facility]

## 2023-09-15 NOTE — Anesthesia Preprocedure Evaluation (Addendum)
 Anesthesia Evaluation  Patient identified by MRN, date of birth, ID band Patient awake    Reviewed: Allergy & Precautions, NPO status , Patient's Chart, lab work & pertinent test results  History of Anesthesia Complications Negative for: history of anesthetic complications  Airway Mallampati: II  TM Distance: >3 FB Neck ROM: full    Dental  (+) Dental Advidsory Given, Edentulous Upper, Edentulous Lower   Pulmonary neg shortness of breath, neg sleep apnea, COPD,  COPD inhaler, neg recent URI, Current Smoker and Patient abstained from smoking.   Pulmonary exam normal        Cardiovascular hypertension, (-) angina + CAD, + Past MI and + Peripheral Vascular Disease  Normal cardiovascular exam(-) dysrhythmias (-) Valvular Problems/Murmurs Rhythm:Regular Rate:Normal  ECG 03/10/23: normal  TRANSTHORACIC ECHOCARDIOGRAM performed of 10/21/2022 1. Left ventricular ejection fraction, by estimation, is 60 to 65%. The left ventricle has normal function. The left ventricle has no regional  wall motion abnormalities. There is mild left ventricular hypertrophy. Left ventricular diastolic parameters were normal.  2. Right ventricular systolic function is normal. The right ventricular size is normal.  3. The mitral valve is normal in structure. Mild mitral valve regurgitation.  4. The aortic valve was not well visualized. Aortic valve regurgitation is not visualized. No aortic stenosis is present.  5. The inferior vena cava is normal in size with greater than 50% respiratory variability, suggesting right atrial pressure of 3 mmHg.    MYOCARDIAL PERFUSION IMAGING STUDY (LEXISCAN) performed on 09/01/2019 1. Normal left ventricular systolic function with a normal LVEF of 55-65% 2. Normal myocardial thickening and wall motion 3. Left ventricular cavity size normal 4. SPECT images demonstrate homogenous tracer distribution throughout the myocardium 5. No  evidence of stress-induced myocardial ischemia or arrhythmia 6. Normal low risk study    Neuro/Psych neg Seizures PSYCHIATRIC DISORDERS Anxiety     TIA   GI/Hepatic ,GERD  Medicated,,(+) Hepatitis -, C  Endo/Other  negative endocrine ROSneg diabetes  Obesity   Renal/GU      Musculoskeletal   Abdominal Normal abdominal exam  (+)   Peds  Hematology  (+) Blood dyscrasia, anemia   Anesthesia Other Findings S/p 2 unit prbc this admission for Acute on chronic iron deficiency anemia, intermittent melena  Past Medical History: No date: Anemia No date: Anxiety No date: Aortic atherosclerosis (HCC) No date: Asthma 08/02/2015: Atypical chest pain No date: Carotid artery disease (HCC) No date: Chronic back pain No date: Chronic nausea 02/27/2016: Chronic, continuous use of opioids     Comment:  a.) has naloxone Rx available No date: COPD (chronic obstructive pulmonary disease) (HCC) No date: Coronary artery disease     Comment:  a.) LHC 2011: no sig CAD (report unavailable); b.) LHC               07/07/2014: 10-20% mLM, 25% LAD, 30-40% LCx, 20-30% RCA               -- med mgmt; c.) LHC 02/22/2016: 25% LM, 25% pLCx --> med              mgmt; d.) MPI 07/10/2017: no ischemia; e.) MPI               09/01/2019: no ischemia; f.) cCTA 10/28/2022: Ca score               1092 (99th percentile for age/sex/race match control) No date: Current smoker No date: DDD (degenerative disc disease), cervical     Comment:  a.) s/p ACDF C5-C7 and PSIF C5-T1 05/23/2020: DDD (degenerative disc disease), lumbar     Comment:  a.) s/p L5-S1 PLIF No date: GERD (gastroesophageal reflux disease) No date: Hepatic steatosis No date: Hepatitis C virus infection cured after antiviral drug  therapy     Comment:  a.) s/p Tx with standard glecaprevir-pibrentasvir course No date: Hypercholesteremia No date: Hyperkalemia No date: Hyperlipidemia No date: Hypertension No date: Hypokalemia 01/04/2014:  Hypomagnesemia No date: Incomplete tear of left rotator cuff No date: Insomnia     Comment:  a.) on hypnotic PRN (zolpidem) 2012: MI (myocardial infarction) (HCC) No date: Migraines 03/03/2014: MSSA (methicillin susceptible Staphylococcus aureus)  septicemia (HCC)     Comment:  a.) TEE (-) for valvular vegitation No date: Nerve pain     Comment:  per patient, in the lower back No date: Osteoarthritis No date: Pneumonia No date: PTSD (post-traumatic stress disorder) No date: PVD (peripheral vascular disease) (HCC) No date: Tendinitis of left rotator cuff 2014: TIA (transient ischemic attack)  Past Surgical History: 03/30/2018: ANTERIOR CERVICAL DECOMP/DISCECTOMY FUSION; N/A     Comment:  Procedure: Cervical six-seven Anterior cervical               decompression/discectomy/fusion;  Surgeon: Jadene Pierini, MD;  Location: [facility] OR;  Service: Neurosurgery;                Laterality: N/A; 01/11/2019: ANTERIOR CERVICAL DECOMP/DISCECTOMY FUSION; N/A     Comment:  Procedure: Cervical Five-Six Anterior cervical               discectomy fusion,  Cervical Five to Cervical Seven               anterior instrumented fusion;  Surgeon: Jadene Pierini, MD;  Location: [facility] OR;  Service: Neurosurgery;                Laterality: N/A;  Cervical Five-Six Anterior cervical               discectomy fusion,  Cervical Five to Cervical Seven               anterior instrumented fusion 04/21/2023: BIOPSY     Comment:  Procedure: BIOPSY;  Surgeon: Toney Reil, MD;                Location: [facility] ENDOSCOPY;  Service: Gastroenterology;; 02/22/2016: CARDIAC CATHETERIZATION; Left     Comment:  Procedure: Left Heart Cath and Coronary Angiography;                Surgeon: Alwyn Pea, MD;  Location: [facility] INVASIVE               CV LAB;  Service: Cardiovascular;  Laterality: Left; No date: CATARACT EXTRACTION W/ INTRAOCULAR LENS IMPLANT; Right 09/19/2016: COLONOSCOPY  WITH PROPOFOL; N/A     Comment:  Procedure: COLONOSCOPY WITH PROPOFOL;  Surgeon: [name], MD;  Location: [facility] ENDOSCOPY;  Service: Endoscopy;              Laterality: N/A; 04/21/2023: COLONOSCOPY WITH PROPOFOL; N/A     Comment:  Procedure: COLONOSCOPY WITH PROPOFOL;  Surgeon: Toney Reil, MD;  Location:  [facility] ENDOSCOPY;  Service:               Gastroenterology;  Laterality: N/A; No date: DIAGNOSTIC LAPAROSCOPY 09/18/2017: ESOPHAGOGASTRODUODENOSCOPY (EGD) WITH PROPOFOL; N/A     Comment:  Procedure: ESOPHAGOGASTRODUODENOSCOPY (EGD) WITH               PROPOFOL;  Surgeon: [name], MD;  Location: [facility]               ENDOSCOPY;  Service: Gastroenterology;  Laterality: N/A; 08/10/2019: ESOPHAGOGASTRODUODENOSCOPY (EGD) WITH PROPOFOL; N/A     Comment:  Procedure: ESOPHAGOGASTRODUODENOSCOPY (EGD) WITH               PROPOFOL;  Surgeon: [name], MD;  Location[facility]               ENDOSCOPY;  Service: Endoscopy;  Laterality: N/A; 04/21/2023: ESOPHAGOGASTRODUODENOSCOPY (EGD) WITH PROPOFOL; N/A     Comment:  Procedure: ESOPHAGOGASTRODUODENOSCOPY (EGD) WITH               PROPOFOL;  Surgeon: Toney Reil, MD;  Location:               [facility] ENDOSCOPY;  Service: Gastroenterology;  Laterality:               N/A; No date: HEMORRHOID SURGERY 07/07/2014: LEFT HEART CATH AND CORONARY ANGIOGRAPHY; Left 2011: LEFT HEART CATH AND CORONARY ANGIOGRAPHY; Left 11/05/2022: LUMBAR WOUND DEBRIDEMENT; N/A     Comment:  Procedure: Thoracic one spinous process removal and               wound revision;  Surgeon: Jadene Pierini, MD;                Location: [facility] OR;  Service: Neurosurgery;  Laterality:               N/A; No date: NASAL SINUS SURGERY No date: ORIF FEMUR FRACTURE; Left No date: ORIF TIBIA & FIBULA FRACTURES; Right No date: OVARIAN CYST SURGERY 05/14/2021: POSTERIOR CERVICAL FUSION/FORAMINOTOMY; N/A     Comment:  Procedure: Cervical five, Cervical six                Laminectomy,foraminotomy with Cervical five-six Cervical               six-seven Cervical seven-Thoracic one Posterior               instrumented fusion;  Surgeon: Jadene Pierini, MD;                Location: [facility] OR;  Service: Neurosurgery;  Laterality:               N/A; 05/28/2022: SHOULDER ARTHROSCOPY WITH SUBACROMIAL DECOMPRESSION,  ROTATOR CUFF REPAIR AND BICEP TENDON REPAIR; Left     Comment:  Procedure: SHOULDER ARTHROSCOPY WITH DEBRIDEMENT,               DECOMPRESSION, ROTATOR CUFF REPAIR AND BICEP TENDON               REPAIR;  Surgeon: Christena Flake, MD;  Location: [facility] ORS;              Service: Orthopedics;  Laterality: Left; 12/03/2022: SHOULDER ARTHROSCOPY WITH SUBACROMIAL DECOMPRESSION,  ROTATOR CUFF REPAIR AND BICEP TENDON REPAIR; Left     Comment:  Procedure: SHOULDER Extensive ARTHROSCOPY WITH               DEBRIDEMENT, removal of Retained Foreign Body;  Surgeon:  Poggi, Excell Seltzer, MD;  Location: [facility] ORS;  Service:               Orthopedics;  Laterality: Left; 03/11/2023: SHOULDER ARTHROSCOPY WITH SUBACROMIAL DECOMPRESSION,  ROTATOR CUFF REPAIR AND BICEP TENDON REPAIR; Right     Comment:  Procedure: RIGHT SHOULDER ARTHROSCOPY WITH DEBRIDMENT,               DECOMPRESSION and BICEPS TENODESIS.;  Surgeon: Christena Flake, MD;  Location: [facility] ORS;  Service: Orthopedics;                Laterality: Right; 05/23/2020: TRANSFORAMINAL LUMBAR INTERBODY FUSION W/ MIS 1 LEVEL;  Right     Comment:  Procedure: Right Lumbar Five Sacral One Minimally               invasive transforaminal lumbar interbody fusion;                Surgeon: Jadene Pierini, MD;  Location: [facility] OR;                Service: Neurosurgery;  Laterality: Right;  Right Lumbar               Five Sacral One Minimally invasive transforaminal lumbar               interbody fusion  BMI    Body Mass Index: 32.37 kg/m      Reproductive/Obstetrics negative OB ROS                              Anesthesia Physical Anesthesia Plan  ASA: 3  Anesthesia Plan: General   Post-op Pain Management:    Induction: Intravenous  PONV Risk Score and Plan: 3 and Propofol infusion and TIVA  Airway Management Planned: Natural Airway  Additional Equipment:   Intra-op Plan:   Post-operative Plan:   Informed Consent: I have reviewed the patients History and Physical, chart, labs and discussed the procedure including the risks, benefits and alternatives for the proposed anesthesia with the patient or authorized representative who has indicated his/her understanding and acceptance.     Dental Advisory Given  Plan Discussed with: Anesthesiologist, CRNA and Surgeon  Anesthesia Plan Comments:        Anesthesia Quick Evaluation

## 2023-09-15 NOTE — Op Note (Signed)
 [facility] Gastroenterology Patient Name: Sara Munoz Procedure Date: 09/15/2023 12:05 PM MRN: [id number] Account #: [id number] Date of Birth: [birth year] Admit Type: Outpatient Age: 58 Room: [facility] ENDO ROOM 4 Gender: Female Note Status: Finalized Instrument Name: Nelda Marseille [id number] Procedure:             Colonoscopy Indications:           Iron deficiency anemia Providers:             Midge Minium MD, MD Referring MD:          Simmon-Robinson Medicines:             Propofol per Anesthesia Complications:         No immediate complications. Procedure:             Pre-Anesthesia Assessment:                        - Prior to the procedure, a History and Physical was                         performed, and patient medications and allergies were                         reviewed. The patient's tolerance of previous                         anesthesia was also reviewed. The risks and benefits                         of the procedure and the sedation options and risks                         were discussed with the patient. All questions were                         answered, and informed consent was obtained. Prior                         Anticoagulants: The patient has taken no anticoagulant                         or antiplatelet agents. ASA Grade Assessment: II - A                         patient with mild systemic disease. After reviewing                         the risks and benefits, the patient was deemed in                         satisfactory condition to undergo the procedure.                        After obtaining informed consent, the colonoscope was                         passed under direct vision. Throughout the procedure,  the patient's blood pressure, pulse, and oxygen                         saturations were monitored continuously. The                         Colonoscope was introduced through the anus and                          advanced to the the cecum, identified by appendiceal                         orifice and ileocecal valve. The colonoscopy was                         performed without difficulty. The patient tolerated                         the procedure well. The quality of the bowel                         preparation was adequate. Findings:      The perianal and digital rectal examinations were normal.      Non-bleeding internal hemorrhoids were found during retroflexion. The       hemorrhoids were Grade I (internal hemorrhoids that do not prolapse). Impression:            - Non-bleeding internal hemorrhoids.                        - No specimens collected. Recommendation:        - Return patient to hospital ward for ongoing care.                        - Resume previous diet.                        - Continue present medications.                        - To visualize the small bowel, perform video capsule                         endoscopy today. Procedure Code(s):     --- Professional ---                        [phone], Colonoscopy, flexible; diagnostic, including                         collection of specimen(s) by brushing or washing, when                         performed (separate procedure) Diagnosis Code(s):     --- Professional ---                        D50.9, Iron deficiency anemia, unspecified CPT copyright 2022 American Medical Association. All rights reserved. The codes documented in this report are preliminary and upon coder review may  be revised to meet current compliance requirements. Maxx Calaway [employer]  MD, MD 09/15/2023 12:44:36 PM This report has been signed electronically. Number of Addenda: 0 Note Initiated On: 09/15/2023 12:05 PM Scope Withdrawal Time: 0 hours 6 minutes 9 seconds  Total Procedure Duration: 0 hours 10 minutes 45 seconds  Estimated Blood Loss:  Estimated blood loss: none.      [name]

## 2023-09-15 NOTE — Anesthesia Procedure Notes (Signed)
 Procedure Name: General with mask airway Date/Time: 09/15/2023 12:35 PM  Performed by: Mohammed Kindle, CRNAPre-anesthesia Checklist: Patient identified, Emergency Drugs available, Suction available and Patient being monitored Patient Re-evaluated:Patient Re-evaluated prior to induction Oxygen Delivery Method: Simple face mask Induction Type: IV induction Placement Confirmation: positive ETCO2 and breath sounds checked- equal and bilateral Dental Injury: Teeth and Oropharynx as per pre-operative assessment

## 2023-09-15 NOTE — Plan of Care (Signed)

## 2023-09-16 DIAGNOSIS — D649 Anemia, unspecified: Secondary | ICD-10-CM | POA: Diagnosis not present

## 2023-09-16 LAB — CBC
HCT: 27.5 % — ABNORMAL LOW (ref 36.0–46.0)
Hemoglobin: 8.3 g/dL — ABNORMAL LOW (ref 12.0–15.0)
MCH: 21.5 pg — ABNORMAL LOW (ref 26.0–34.0)
MCHC: 30.2 g/dL (ref 30.0–36.0)
MCV: 71.2 fL — ABNORMAL LOW (ref 80.0–100.0)
Platelets: 178 10*3/uL (ref 150–400)
RBC: 3.86 MIL/uL — ABNORMAL LOW (ref 3.87–5.11)
RDW: 24.1 % — ABNORMAL HIGH (ref 11.5–15.5)
WBC: 8.3 10*3/uL (ref 4.0–10.5)
nRBC: 2 % — ABNORMAL HIGH (ref 0.0–0.2)

## 2023-09-16 LAB — BASIC METABOLIC PANEL
Anion gap: 9 (ref 5–15)
BUN: 7 mg/dL (ref 6–20)
CO2: 24 mmol/L (ref 22–32)
Calcium: 8.7 mg/dL — ABNORMAL LOW (ref 8.9–10.3)
Chloride: 105 mmol/L (ref 98–111)
Creatinine, Ser: 0.82 mg/dL (ref 0.44–1.00)
GFR, Estimated: >60 mL/min (ref >60)
Glucose, Bld: 101 mg/dL — ABNORMAL HIGH (ref 70–99)
Potassium: 3.6 mmol/L (ref 3.5–5.1)
Sodium: 138 mmol/L (ref 135–145)

## 2023-09-16 LAB — MAGNESIUM: Magnesium: 1.6 mg/dL — ABNORMAL LOW (ref 1.7–2.4)

## 2023-09-16 LAB — PHOSPHORUS: Phosphorus: 2.7 mg/dL (ref 2.5–4.6)

## 2023-09-16 MED ORDER — PANTOPRAZOLE SODIUM 40 MG PO TBEC
40.0000 mg | DELAYED_RELEASE_TABLET | Freq: Every day | ORAL | Status: DC
  Administered 2023-09-16: 40 mg via ORAL
  Filled 2023-09-16: qty 1

## 2023-09-16 MED ORDER — POLYSACCHARIDE IRON COMPLEX 150 MG PO CAPS: 150.0000 mg | ORAL_CAPSULE | Freq: Every day | ORAL | 2 refills | Status: DC

## 2023-09-16 MED ORDER — MAGNESIUM SULFATE 2 GM/50ML IV SOLN
2.0000 g | Freq: Once | INTRAVENOUS | Status: AC
  Administered 2023-09-16: 2 g via INTRAVENOUS
  Filled 2023-09-16: qty 50

## 2023-09-16 MED ORDER — POTASSIUM CHLORIDE CRYS ER 20 MEQ PO TBCR
40.0000 meq | EXTENDED_RELEASE_TABLET | Freq: Once | ORAL | Status: AC
  Administered 2023-09-16: 40 meq via ORAL
  Filled 2023-09-16: qty 2

## 2023-09-16 MED ORDER — ASCORBIC ACID 500 MG PO TABS: 500.0000 mg | ORAL_TABLET | Freq: Every day | ORAL | 2 refills | Status: AC

## 2023-09-16 MED ORDER — CYANOCOBALAMIN 1000 MCG PO TABS
1000.0000 ug | ORAL_TABLET | Freq: Every day | ORAL | 0 refills | Status: AC
Start: 2023-09-17 — End: 2023-10-17

## 2023-09-16 NOTE — Plan of Care (Signed)
  Problem: Education: Goal: Knowledge of General Education information will improve Description: Including pain rating scale, medication(s)/side effects and non-pharmacologic comfort measures Outcome: Progressing   Problem: Activity: Goal: Risk for activity intolerance will decrease Outcome: Progressing   Problem: Nutrition: Goal: Adequate nutrition will be maintained Outcome: Progressing   Problem: Coping: Goal: Level of anxiety will decrease Outcome: Progressing   Problem: Elimination: Goal: Will not experience complications related to bowel motility Outcome: Progressing Goal: Will not experience complications related to urinary retention Outcome: Progressing   Problem: Pain Managment: Goal: General experience of comfort will improve and/or be controlled Outcome: Progressing   Problem: Safety: Goal: Ability to remain free from injury will improve Outcome: Progressing

## 2023-09-16 NOTE — Discharge Instructions (Signed)
 Food Resources  Agency Name: [facility] Agency Address: [address], [place], [place] [address] Phone: [phone] Website: [email or web address] Service(s) Offered: Housing services, self-sufficiency, congregate meal program, weatherization program, Event organiser program, emergency food assistance,  housing counseling, home ownership program, wheels - to work program.  [employer] free for 60 and older at various locations from [employer], Monday-Friday:  [employer], [address] [place], [phone] -[facility], [address], [place] [phone]  -[facility], [address], [place] [phone]  -[address], [place], [phone]  Agency Name: [facility] Address: [phone] [address], [address], [place], [place] [address] Phone: [phone] Website: [email or web address] Service(s) Offered: Home delivered hot, frozen, and emergency  meals. Grocery assistance program which matches  volunteers one-on-one with seniors unable to grocery shop  for themselves. Must be 60 years and older; less than 20  hours of in-home aide service, limited or no driving ability;  live alone or with someone with a disability; live in  [place].  Agency Name: [facility] [employer]) Address: [address] [place], [place] [address] Phone: [phone] Service(s) Offered: Food is served to shut-ins, homeless, elderly, and low income people in the community every Saturday (11:30 am-12:30 pm) and Sunday (12:30 pm-1:30pm). Volunteers also offer help and encouragement in seeking employment,  and spiritual guidance.  Agency Name: Department of Social Services Address: [address] [place], [place] [address] Phone: [phone] Service(s) Offered: Child support services; child welfare services; food stamps; Medicaid; work first family assistance; and aid  with fuel,  rent, food and medicine.  Agency Name: Dietitian Address: [address] [place], [place] Phone: [phone] Website: [email or web address] Services Offered: Monday 10:00am-12:00, 8:00pm-9:00pm, and Friday 10:00am-12:00.  Agency Name: [employer] [facility] [place] Address: [address] [place], [place] [address] Phone: [phone] Website: [email or web address] Service(s) Offered: Serves weekday meals, open from 11:30 am- 1:00 pm., and 6:30-7:30pm, Monday-Wednesday-Friday distributes food 3:30-6pm, Monday-Wednesday-Friday.  Agency Name: [facility] Address: [address] [place], [place] Phone: [phone] Website: [email or web address] Services Offered: Distributes food the 4th Saturday of the month, starting at 8:00 am  Agency Name: [facility] Address: [phone] [address], [place], [place] [address] Phone: [phone] Website: [email or web address] Service(s) Offered: Bread of life, weekly food pantry. Open Wednesdays from 10:00am-noon.  Agency Name: [facility] [employer] Address: [address] [place], [place], [place] Phone: [phone] Services Offered: Distributes food 9am-1pm, Monday-Thursday. Call for details.  Agency Name: [facility] Address: [address], [place], [place] [address] Phone: [phone] Website: [email or web address] Service(s) Offered: Games developer. Call for assistance.  Agency Name: [facility] Address: [address], [place], [place] [address] Phone: [phone] Service Offered: Emergency Food Pantry. Call for appointment.  Agency Name: [facility] Address: [address] [place], [place] [address] Phone: [phone] Website: [email or web address] Services Offered: Games developer. Call for details  Agency Name: [facility] Address: [address] [place], [place] Phone:  [phone] Website: [email or web address] Service(s) Offered: Emergency Food Pantry. Call for details.  Agency Name: Holiday representative Address: [address], [place], [place] [address] Phone: [phone] or [phone] Website: [email or web address] Service(s) Offered: Distribute food 9am-11:30 am, Tuesday-Friday, and 1-3:30pm, Monday-Friday. Food pantry Monday-Friday 1pm-3pm, fresh items, Mon.-Wed.-Fri.  Agency Name: [facility] Address: [address] [place], [place] [address] Phone: [phone] Website: [email or web address] Services Offered: Distribute food Tues and Sats from 9:00am-noon.  Closed 1st Saturday of each month. Call for details  Agency Name: [employer] Address: Reynaldo Minium [facility] [address], [place] [address] Phone: [phone]  Services Offered: Delivers meals every Thursday   Transportation Resources  Agency Name: [facility] Agency Address: [address] [place], [place] [address] Phone: [phone] Email: [email or web address] Website: [email or web address] Service(s) Offered: Housing services, self-sufficiency, congregate meal program, weatherization program, Field seismologist program, emergency food assistance,  housing counseling, home ownership program, wheels-towork program.  Agency Name: [facility] [employer] [phone]) Address: [address] [place], [place] [address] Phone: [phone] Website: [email or web address] Service(s) Offered: Transportation for [employer], subscription and demand response; Dial-a-Ride for citizens 7 years of age or older.  Agency Name: Department of Social Services Address: [address] [place], [place] [address] Phone: [phone] Service(s) Offered: Child support services; child welfare services; food stamps; Medicaid; work first family assistance; and aid with fuel,  rent,  food and medicine, transportation assistance.  Agency Name: Disabled [employer] (DAV) Transportation  Network Phone: [phone] Service(s) Offered: Transports veterans to the [facility] medical center. Call  forty-eight hours in advance and leave the name, telephone  number, date, and time of appointment. Veteran will be  contacted by the driver the day before the appointment to  arrange a pick up point   Transportation Resources  Agency Name: [facility] Agency Address: [address] [place], [place] [address] Phone: [phone] Email: [email or web address] Website: [email or web address] Service(s) Offered: Housing services, self-sufficiency, congregate meal program, weatherization program, Field seismologist program, emergency food assistance,  housing counseling, home ownership program, wheels-towork program.  Agency Name: [facility] [employer] [phone]) Address: [address], [place], [place] [address] Phone: [phone] Website: [email or web address] Service(s) Offered: Transportation for [employer], subscription and demand response; Dial-a-Ride for citizens 66 years of age or older.  Agency Name: Department of Social Services Address: [address] [place], [place] [address] Phone: [phone] Service(s) Offered: Child support services; child welfare services; food stamps; Medicaid; work first family assistance; and aid with fuel,  rent, food and medicine, transportation assistance.  Agency Name: Disabled [employer] (DAV) Transportation  Network Phone: [phone] Service(s) Offered: Transports veterans to the [facility] medical center. Call  forty-eight hours in advance and leave the name, telephone  number, date, and time of appointment. Veteran will be  contacted by the driver the day before the appointment to  arrange a pick up point    [facility]A currently provides door to door services. ACTA connects with PART daily for services to [facility]. ACTA also performs contract services to [employer] operates 27 vehicles, all but 3 mini-vans are equipped with lifts for special needs as well as the general public. ACTA drivers are each CDL certified and trained in First Aid and CPR. ACTA was established in 2002 by [employer]. An independent Industrial/product designer. ACTA operates via Cytogeneticist with required Research scientist (physical sciences) from [place]. ACTA provides over 80,000 passenger trips each year, including [facility] and [employer] sites.  Call at least by 11 AM one business day prior to needing transportation  [employer].                      [place], [place] [address]     Office Hours: Monday-Friday  8 AM - 5 PM   the Institute  on Aging offers a [place] [employer] that anyone can call toll free at [phone]. The friendship line is available 24 hours a day  [employer] is a Program of All-inclusive Care for the Elderly (PACE). Their mission is to promote and sustain the independence of seniors wishing to remain in the community. They provide seniors with comprehensive long-term health, social, medical and dietary care. Their program is a safe alternative to nursing home care. [phone]  [facility] Physical Address [facility] [address] [place], [place] [address] Phone: [phone]. . Online zoom yoga class, connect with others without leaving your home [facility] offers [place] dance cardio sessions for individuals via Zoom. This program provides: - Dance fitness activities Please contact program for more information. Servinganyone in need adults 18+ hiv/aids individuals families Call [phone]  Email [email or web address] to get more info  [employer]  offers an online [employer] to individuals where they can receive help to focus on their best health. Whether you're a [employer] member or not, the neighborhood center offers a... Main Serviceshealth education  exercise & fitness  community support services  recreation  virtual support Other Servicessupport groups Servinganyone in need adults young adults teens seniors individuals families [email or web address] to get more info  Schedule on their website  The [facility] offers an array of activities for adults age 37 and over. This program provides:- Fitness and health programs- Tech classes- Activity books Main Serviceshealth education  community support services  exercise & fitness  recreation  more education Servingseniors  Call [phone]    For more resources go online to [email or web address] and type in you zipcode
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
## 2023-09-16 NOTE — Care Management Important Message (Signed)
 Important Message  Patient Details  Name: Sara Munoz MRN: [id number] Date of Birth: 27-Feb-1966   Important Message Given:  Yes - Medicare IM     Cristela Blue, CMA 09/16/2023, 12:15 PM

## 2023-09-16 NOTE — Progress Notes (Signed)
 The capsule endoscopy was read this morning and showed that after 8 hours the capsule never left the stomach with the introduction of food a few hours after ingestion of the capsule.  The likely causes the patient's immobility while in the hospital laying down.  The patient should have the procedure repeated with her primary gastroenterologist as an outpatient for a complete workup of her anemia.  Nothing to do from a GI point of view at this point.  I will sign off.  Please call if any further GI concerns or questions.  We would like to thank you for the opportunity to participate in the care of Sara Munoz.

## 2023-09-16 NOTE — Discharge Summary (Signed)
 [facility] Discharge Summary   Patient: Sara YIN WUJ:[id number]  PCP: Ronnald Ramp, MD  Date of admission: 09/12/2023   Date of discharge:  09/16/2023     Discharge Diagnoses:  Principal Problem:   Symptomatic anemia Active Problems:   Essential hypertension   Dyslipidemia   GERD without esophagitis   Peripheral neuropathy   Chronic obstructive pulmonary disease (COPD) (HCC)   Admitted From: Home Disposition:  Home   Recommendations for Outpatient Follow-up:  Follow-up with PCP in 1 week, repeat CBC BMP and mag in 1 week Repeat iron profile and B12 level after 3 to 6 months Follow-up with GI for capsule endoscopy as an outpatient.  Capsule did not exit the stomach possible patient was laying entirely as per GI Follow up LABS/TEST:     Follow-up Information     [name], Tawanna Cooler, MD Follow up.   Specialty: Family Medicine Why: Patient wants to make own follow up appt Contact information: [address] [place] [address] [phone]                Diet recommendation: Cardiac diet  Activity: The patient is advised to gradually reintroduce usual activities, as tolerated  Discharge Condition: stable  Code Status: Full code   History of present illness: As per the H and P dictated on admission. Hospital Course:  # Symptomatic anemia S/p 2 units of PRBC transfusion.  Hb responded appropriately to 2 units.  Patient was worked up for iron deficiency anemia in the outpatient setting around 03/2023.  She underwent a EGD and colonoscopy.  EGD was notable for erythematous mucosa of the stomach with pathology negative for H. pylori.  Colonoscopy was limited due to poor prep.  Patient was to return in 3 months and continue iron supplementation as well as stop BC powder use.  Patient reports she continues to use BC powder about 3 times a day.  She is not currently taking oral iron supplementation.  She notes that she has  black stools occasionally, the last of which was about 2 weeks ago.  Discussed case with GI who recommended IV PPI, clear liquid diet, and EGD/colonoscopy. S/p pantoprazole 40 mg IV twice daily 3/23 Hb 7.7--->8.3 today on 3/25 On 3/24 s/p EGD: Normal, no specimen collected. S/p colonoscopy: Nonbleeding internal hemorrhoids, no specimens collected. S/p capsule endoscopy. Capsule did not exit the stomach possible patient was laying entirely as per GI GI recommended to follow-up as an outpatient.  Resumed omeprazole 40 mg p.o. twice daily home dose.  Discontinued Carafate, as per GI she does not need it.   # Iron deficiency, transferrin saturation 4% Continue Venofer 3 mg IV daily x 4 doses, started 3/22 # Vitamin B12 level 317, goal >400, started vitamin B12 1000 mcg IM injection x 2 doses followed by oral supplement.   # Hypokalemia, potassium repleted IV followed by oral # Hypophosphatemia due to nutritional deficiency, resolved s/p oral Neutraphos # Essential hypertension: Continued metoprolol 12.5 mg p.o. twice daily, lisinopril 20 mg p.o. daily.  Imdur 15 mg p.o. daily.  Amlodipine 5 mg p.o. nightly. - Continue home antihypertensive therapy. 3/23 BP was soft in the morning, skipped a.m. dose of antihypertensive medications.  Patient was advised to monitor BP at home and follow with PCP to titrate medication accordingly. # H/o CAD, TIA, PVD  and Dyslipidemia  Continue home statin therapy. Hold aspirin during hospital stay, resumed on discharge.   # GERD without esophagitis: s/p PPI IV and Carafate.  Continue  PPI only, no need of Carafate as per GI.  EGD negative.   # Chronic obstructive pulmonary disease (COPD) Is not in acute exacerbation, continue her inhalers.   # Peripheral neuropathy: Continue home Neurontin. # S/p thoracic decompression and fusion: On chronic opioid theapy      Body mass index is 31.79 kg/m.  Nutrition Interventions:  - Patient was instructed, not to drive,  operate heavy machinery, perform activities at heights, swimming or participation in water activities or provide baby sitting services while on Pain, Sleep and Anxiety Medications; until her outpatient Physician has advised to do so again.  - Also recommended to not to take more than prescribed Pain, Sleep and Anxiety Medications.  Patient was ambulatory without any assistance. On the day of the discharge the patient's vitals were stable, and no other acute medical condition were reported by patient. the patient was felt safe to be discharge at Home.  Consultants: GI Procedures: EGD and colonoscopy  Discharge Exam: General: Appear in no distress, no Rash; Oral Mucosa Clear, moist. Cardiovascular: S1 and S2 Present, no Murmur, Respiratory: normal respiratory effort, Bilateral Air entry present and no Crackles, no wheezes Abdomen: Bowel Sound present, Soft and no tenderness, no hernia Extremities: no Pedal edema, no calf tenderness Neurology: alert and oriented to time, place, and person affect appropriate.  Filed Weights   09/13/23 1352  Weight: 84 kg   Vitals:   09/16/23 0412 09/16/23 0812  BP: 123/63 (!) 141/69  Pulse: 68 63  Resp: 18 19  Temp: 98.4 F (36.9 C) 98.6 F (37 C)  SpO2: (!) 89% 100%    DISCHARGE MEDICATION: Allergies as of 09/16/2023       Reactions   Cyclobenzaprine Hives, Swelling, Rash   Facial/lip swelling      Levofloxacin Hives, Swelling, Rash   Chlorpheniramine Maleate    Other reaction(s): Unknown   Phenergan [promethazine Hcl] Other (See Comments)   Agitation.    Phenylephrine Hcl Other (See Comments)   unknown   Toradol [ketorolac Tromethamine] Swelling, Other (See Comments)   Facial/tongue swelling    Zoloft [sertraline Hcl] Swelling   Tongue swelling      Cephalexin Hives, Rash   Ketorolac Rash   Tramadol Hives, Swelling, Other (See Comments), Rash   Lip swelling         Medication List     STOP taking these medications     sucralfate 1 GM/10ML suspension Commonly known as: CARAFATE       TAKE these medications    amLODipine 5 MG tablet Commonly known as: NORVASC TAKE 1 TABLET BY MOUTH EVERYDAY AT BEDTIME   ascorbic acid 500 MG tablet Commonly known as: VITAMIN C Take 1 tablet (500 mg total) by mouth daily.   aspirin EC 81 MG tablet Take 81 mg by mouth in the morning. Swallow whole.   atorvastatin 40 MG tablet Commonly known as: LIPITOR Take 1 tablet (40 mg total) by mouth every evening.   cyanocobalamin 1000 MCG tablet Take 1 tablet (1,000 mcg total) by mouth daily. Start taking on: September 17, 2023   docusate sodium 100 MG capsule Commonly known as: COLACE Take 100 mg by mouth in the morning.   gabapentin 600 MG tablet Commonly known as: NEURONTIN Take 600 mg by mouth 4 (four) times daily.   Incruse Ellipta 62.5 MCG/ACT Aepb Generic drug: umeclidinium bromide Inhale 1 puff into the lungs in the morning.   iron polysaccharides 150 MG capsule Commonly known as: NIFEREX Take 1 capsule (  150 mg total) by mouth daily.   isosorbide mononitrate 30 MG 24 hr tablet Commonly known as: IMDUR Take 0.5 tablets (15 mg total) by mouth daily.   Linzess 290 MCG Caps capsule Generic drug: linaclotide Take 290 mcg by mouth daily as needed (constipation.).   lisinopril 20 MG tablet Commonly known as: ZESTRIL Take 1 tablet (20 mg total) by mouth daily.   methocarbamol 750 MG tablet Commonly known as: ROBAXIN Take 1 tablet (750 mg total) by mouth every 6 (six) hours.   metoprolol tartrate 25 MG tablet Commonly known as: LOPRESSOR TAKE ONE-HALF TABLET BY MOUTH  TWICE DAILY   mometasone 0.1 % cream Commonly known as: ELOCON Apply 1 Application topically daily as needed (hand rash).   Narcan 4 MG/0.1ML Liqd nasal spray kit Generic drug: naloxone Place 1 spray into the nose as needed (opioid overdose).   nitroGLYCERIN 0.4 MG SL tablet Commonly known as: NITROSTAT Place 0.4 mg under the  tongue every 5 (five) minutes as needed for chest pain.   omeprazole 40 MG capsule Commonly known as: PRILOSEC Take 1 capsule (40 mg total) by mouth 2 (two) times daily.   ondansetron 8 MG tablet Commonly known as: ZOFRAN Take 1 tablet (8 mg total) by mouth every 8 (eight) hours as needed for nausea or vomiting.   oxyCODONE 15 MG immediate release tablet Commonly known as: ROXICODONE Take 15 mg by mouth every 4 (four) hours.   senna 8.6 MG Tabs tablet Commonly known as: SENOKOT Take 1 tablet (8.6 mg total) by mouth 2 (two) times daily as needed for mild constipation.   Spiriva Respimat 2.5 MCG/ACT Aers Generic drug: Tiotropium Bromide Monohydrate Take 1 puff by mouth in the morning and at bedtime. What changed: when to take this   Ventolin HFA 108 (90 Base) MCG/ACT inhaler Generic drug: albuterol USE 1 TO 2 INHALATIONS BY MOUTH  INTO THE LUNGS EVERY 6 HOURS AS  NEEDED FOR SHORTNESS OF BREATH  OR WHEEZING   zolpidem 5 MG tablet Commonly known as: AMBIEN Take 1 tablet (5 mg total) by mouth at bedtime as needed for sleep.       Allergies  Allergen Reactions   Cyclobenzaprine Hives, Swelling and Rash    Facial/lip swelling      Levofloxacin Hives, Swelling and Rash   Chlorpheniramine Maleate     Other reaction(s): Unknown   Phenergan [Promethazine Hcl] Other (See Comments)    Agitation.    Phenylephrine Hcl Other (See Comments)    unknown   Toradol [Ketorolac Tromethamine] Swelling and Other (See Comments)    Facial/tongue swelling    Zoloft [Sertraline Hcl] Swelling    Tongue swelling      Cephalexin Hives and Rash   Ketorolac Rash   Tramadol Hives, Swelling, Other (See Comments) and Rash    Lip swelling    Discharge Instructions     Call MD for:  difficulty breathing, headache or visual disturbances   Complete by: As directed    Call MD for:  extreme fatigue   Complete by: As directed    Call MD for:  persistant dizziness or light-headedness   Complete by:  As directed    Call MD for:  persistant nausea and vomiting   Complete by: As directed    Call MD for:  severe uncontrolled pain   Complete by: As directed    Call MD for:  temperature >100.4   Complete by: As directed    Diet - low sodium heart healthy  Complete by: As directed    Discharge instructions   Complete by: As directed    Follow-up with PCP in 1 week, repeat CBC BMP and mag in 1 week Repeat iron profile and B12 level after 3 to 6 months Follow-up with GI for capsule endoscopy as an outpatient.  Capsule did not exit the stomach possible patient was laying entirely as per GI.   Increase activity slowly   Complete by: As directed        The results of significant diagnostics from this hospitalization (including imaging, microbiology, ancillary and laboratory) are listed below for reference.    Significant Diagnostic Studies: CT Angio Chest PE W/Cm &/Or Wo Cm Result Date: 09/12/2023 CLINICAL DATA:  Pulmonary embolism (PE) suspected, high prob sent by PCP for productive cough and SHOB x1 week. Pt c/o decreased appetite, nausea, and weakness. Pt has COPD- pt does not use oxygen at home. Pt AOX4, EXAM: CT ANGIOGRAPHY CHEST WITH CONTRAST TECHNIQUE: Multidetector CT imaging of the chest was performed using the standard protocol during bolus administration of intravenous contrast. Multiplanar CT image reconstructions and MIPs were obtained to evaluate the vascular anatomy. RADIATION DOSE REDUCTION: This exam was performed according to the departmental dose-optimization program which includes automated exposure control, adjustment of the mA and/or kV according to patient size and/or use of iterative reconstruction technique. CONTRAST:  75mL OMNIPAQUE IOHEXOL 350 MG/ML SOLN COMPARISON:  CT heart 10/28/2022, CT angio chest 03/27/2016 FINDINGS: Cardiovascular: Satisfactory opacification of the pulmonary arteries to the segmental level. No evidence of pulmonary embolism. The main pulmonary  artery is normal in caliber. Normal heart size. No significant pericardial effusion. The thoracic aorta is normal in caliber. Moderate atherosclerotic plaque of the thoracic aorta. Four-vessel coronary artery calcifications. Mitral annular calcification. Mediastinum/Nodes: Prominent nonenlarged prevascular lymph nodes. No enlarged mediastinal, hilar, or axillary lymph nodes. Thyroid gland, trachea, and esophagus demonstrate no significant findings. Lungs/Pleura: Bilateral lower lobe atelectasis. No focal consolidation. Chronic right upper lobe cluster pulmonary micronodules (7:67; 5:32-42)) -no further follow-up indicated. No pulmonary mass. No pleural effusion. No pneumothorax. Upper Abdomen: Prominent but nonenlarged upper abdominal lymph nodes. No acute abnormality. Musculoskeletal: No chest wall abnormality. No suspicious lytic or blastic osseous lesions. No acute displaced fracture. CT8-T11 posterolateral surgical hardware. No CT evidence of surgical hardware complication. Review of the MIP images confirms the above findings. IMPRESSION: 1. No pulmonary embolus. 2. No acute intrathoracic abnormality. 3. Aortic Atherosclerosis (ICD10-I70.0) including four-vessel coronary calcification and mitral annular calcification. 4. Prominent but nonenlarged prevascular mediastinal and upper abdominal lymph nodes. Recommend attention on follow-up. Electronically Signed   By: Tish Frederickson M.D.   On: 09/12/2023 23:12   DG Chest 2 View Result Date: 09/12/2023 CLINICAL DATA:  COPD EXAM: CHEST - 2 VIEW COMPARISON:  Chest x-ray 11/17/2019 FINDINGS: The heart and mediastinal contours are unchanged. Question rounded density overlying the right lower lobe and ribs. No pulmonary edema. Trace to small right pleural effusion. No right pleural effusion. No pneumothorax. No acute osseous abnormality. Cervical spine surgical hardware. Lower thoracic spine surgical hardware. IMPRESSION: 1. Trace to small right pleural effusion. 2.  Question rounded density overlying the right lower lobe and ribs. 3. Followup PA and lateral chest X-ray is recommended in 3-4 weeks to ensure resolution. Electronically Signed   By: Tish Frederickson M.D.   On: 09/12/2023 20:02   DG Thoracic Spine 2 View Result Date: 09/09/2023 CLINICAL DATA:  Status post fusion EXAM: THORACIC SPINE 2 VIEWS COMPARISON:  December 01, 2018 MRI thoracic spine  March 25, 2023 FINDINGS: Status post transpedicular fixation of T8 through T11. With near anatomic alignment. There is no other fracture or bony or articular abnormalities. IMPRESSION: Status post transpedicular fixation of T8 through T11. Electronically Signed   By: Shaaron Adler M.D.   On: 09/09/2023 22:42    Microbiology: Recent Results (from the past 240 hours)  Resp panel by RT-PCR (RSV, Flu A&B, Covid) Anterior Nasal Swab     Status: None   Collection Time: 09/12/23 10:18 PM   Specimen: Anterior Nasal Swab  Result Value Ref Range Status   SARS Coronavirus 2 by RT PCR NEGATIVE NEGATIVE Final    Comment: (NOTE) SARS-CoV-2 target nucleic acids are NOT DETECTED.  The SARS-CoV-2 RNA is generally detectable in upper respiratory specimens during the acute phase of infection. The lowest concentration of SARS-CoV-2 viral copies this assay can detect is 138 copies/mL. A negative result does not preclude SARS-Cov-2 infection and should not be used as the sole basis for treatment or other patient management decisions. A negative result may occur with  improper specimen collection/handling, submission of specimen other than nasopharyngeal swab, presence of viral mutation(s) within the areas targeted by this assay, and inadequate number of viral copies(<138 copies/mL). A negative result must be combined with clinical observations, patient history, and epidemiological information. The expected result is Negative.  Fact Sheet for Patients:  BloggerCourse.com  Fact Sheet for Healthcare  Providers:  SeriousBroker.it  This test is no t yet approved or cleared by the [place] FDA and  has been authorized for detection and/or diagnosis of SARS-CoV-2 by FDA under an Emergency Use Authorization (EUA). This EUA will remain  in effect (meaning this test can be used) for the duration of the COVID-19 declaration under Section 564(b)(1) of the Act, 21 U.S.C.section 360bbb-3(b)(1), unless the authorization is terminated  or revoked sooner.       Influenza A by PCR NEGATIVE NEGATIVE Final   Influenza B by PCR NEGATIVE NEGATIVE Final    Comment: (NOTE) The Xpert Xpress SARS-CoV-2/FLU/RSV plus assay is intended as an aid in the diagnosis of influenza from Nasopharyngeal swab specimens and should not be used as a sole basis for treatment. Nasal washings and aspirates are unacceptable for Xpert Xpress SARS-CoV-2/FLU/RSV testing.  Fact Sheet for Patients: BloggerCourse.com  Fact Sheet for Healthcare Providers: SeriousBroker.it  This test is not yet approved or cleared by the [place] FDA and has been authorized for detection and/or diagnosis of SARS-CoV-2 by FDA under an Emergency Use Authorization (EUA). This EUA will remain in effect (meaning this test can be used) for the duration of the COVID-19 declaration under Section 564(b)(1) of the Act, 21 U.S.C. section 360bbb-3(b)(1), unless the authorization is terminated or revoked.     Resp Syncytial Virus by PCR NEGATIVE NEGATIVE Final    Comment: (NOTE) Fact Sheet for Patients: BloggerCourse.com  Fact Sheet for Healthcare Providers: SeriousBroker.it  This test is not yet approved or cleared by the [place] FDA and has been authorized for detection and/or diagnosis of SARS-CoV-2 by FDA under an Emergency Use Authorization (EUA). This EUA will remain in effect (meaning this test can be  used) for the duration of the COVID-19 declaration under Section 564(b)(1) of the Act, 21 U.S.C. section 360bbb-3(b)(1), unless the authorization is terminated or revoked.  Performed at [facility], [address] [place], [place] [address]      Labs: CBC: Recent Labs  Lab 09/12/23 1907 09/13/23 205 277 [id number] 09/14/23 0426 09/14/23 1609 09/15/23 0423 09/16/23  0508  WBC 7.1 6.1 8.0  --  8.4 8.3  HGB 5.8* 8.1* 7.7* 8.1* 8.2* 8.3*  HCT 20.7* 27.0* 25.9* 27.3* 27.8* 27.5*  MCV 66.8* 69.9* 70.6*  --  70.9* 71.2*  PLT 174 158 149*  --  182 178   Basic Metabolic Panel: Recent Labs  Lab 09/12/23 1907 09/13/23 0415 09/14/23 0426 09/15/23 0423 09/16/23 0508  NA 135 136 138 142 138  K 3.8 3.9 3.4* 3.1* 3.6  CL 102 104 108 106 105  CO2 24 23 26 27 24   GLUCOSE 71 168* 86 83 101*  BUN 21* 18 17 9 7   CREATININE 1.09* 0.82 0.83 0.80 0.82  CALCIUM 8.7* 8.8* 8.3* 8.5* 8.7*  MG  --   --  1.8 1.7 1.6*  PHOS  --   --  2.3* 4.0 2.7   Liver Function Tests: Recent Labs  Lab 09/13/23 0415  AST 18  ALT 13  ALKPHOS 66  BILITOT 0.9  PROT 6.5  ALBUMIN 3.5   No results for input(s): "LIPASE", "AMYLASE" in the last 168 hours. No results for input(s): "AMMONIA" in the last 168 hours. Cardiac Enzymes: No results for input(s): "CKTOTAL", "CKMB", "CKMBINDEX", "TROPONINI" in the last 168 hours. BNP (last 3 results) No results for input(s): "BNP" in the last 8760 hours. CBG: No results for input(s): "GLUCAP" in the last 168 hours.  Time spent: 35 minutes  Signed:  Gillis Santa  [facility] 09/16/2023 12:18 PM

## 2023-09-19 ENCOUNTER — Encounter: Payer: Self-pay | Admitting: Physician Assistant

## 2023-09-19 ENCOUNTER — Ambulatory Visit: Payer: Self-pay | Admitting: Family Medicine

## 2023-09-19 ENCOUNTER — Ambulatory Visit (INDEPENDENT_AMBULATORY_CARE_PROVIDER_SITE_OTHER): Admitting: Physician Assistant

## 2023-09-19 VITALS — BP 121/61 | HR 53 | Temp 98.2°F | Resp 16 | Ht 64.0 in | Wt 184.0 lb

## 2023-09-19 DIAGNOSIS — F411 Generalized anxiety disorder: Secondary | ICD-10-CM | POA: Diagnosis not present

## 2023-09-19 DIAGNOSIS — R229 Localized swelling, mass and lump, unspecified: Secondary | ICD-10-CM | POA: Diagnosis not present

## 2023-09-19 MED ORDER — HYDROXYZINE PAMOATE 25 MG PO CAPS: 25.0000 mg | ORAL_CAPSULE | Freq: Three times a day (TID) | ORAL | 0 refills | Status: DC | PRN

## 2023-09-19 NOTE — Progress Notes (Signed)
 Established patient visit  Patient: Sara Munoz   DOB: 08-18-1965   58 y.o. Female  MRN: [id number] Visit Date: 09/19/2023  Today's healthcare provider: Debera Lat, [name]   Chief Complaint  Patient presents with   Leg Swelling    Swelling lt forearm, lil painful   Subjective      Discussed the use of AI scribe software for clinical note transcription with the patient, who gave verbal consent to proceed.  History of Present Illness The patient, with a history of internal bleeding of unknown origin, presents with swelling at the site of a recent IV. She was hospitalized from Friday until Tuesday, during which time she received iron and magnesium infusions. After IV infusion, she developed a local swelling in the arm. The swelling has persisted since Tuesday, and she has not applied any treatments at home. She denies pain, redness, and itching at the site, describing it as 'sore.'  In addition to the swelling, the patient reports severe anxiety. She has a history of taking Xanax, Ativan, and Valium, but has not taken any medication for anxiety [place]. She expresses a desire to be put back on medication for her anxiety.       09/19/2023    2:45 PM 08/19/2023    2:44 PM 08/08/2023    9:51 AM  Depression screen PHQ 2/9  Decreased Interest 1 1 1   Down, Depressed, Hopeless 0 0 0  PHQ - 2 Score 1 1 1   Altered sleeping  1 1  Tired, decreased energy 2 1 2   Change in appetite 1 1 1   Feeling bad or failure about yourself  0 0 0  Trouble concentrating 1 1 1   Moving slowly or fidgety/restless 0 0 0  Suicidal thoughts 0 0 0  PHQ-9 Score  5 6  Difficult doing work/chores Not difficult at all Somewhat difficult       09/19/2023    2:45 PM 08/19/2023    2:46 PM 08/08/2023    9:51 AM 06/03/2023    3:17 PM  GAD 7 : Generalized Anxiety Score  Nervous, Anxious, on Edge 1 1 1 1   Control/stop worrying 1 1 1 1   Worry too much - different things 1 1 1 1   Trouble  relaxing 1 2 1 1   Restless 1 1 1 1   Easily annoyed or irritable 1 0 1 1  Afraid - awful might happen 0 0 0 0  Total GAD 7 Score 6 6 6 6   Anxiety Difficulty Not difficult at all Not difficult at all  Somewhat difficult    Medications: Outpatient Medications Prior to Visit  Medication Sig   amLODipine (NORVASC) 5 MG tablet TAKE 1 TABLET BY MOUTH EVERYDAY AT BEDTIME   ascorbic acid (VITAMIN C) 500 MG tablet Take 1 tablet (500 mg total) by mouth daily.   aspirin EC 81 MG tablet Take 81 mg by mouth in the morning. Swallow whole.   atorvastatin (LIPITOR) 40 MG tablet Take 1 tablet (40 mg total) by mouth every evening.   cyanocobalamin 1000 MCG tablet Take 1 tablet (1,000 mcg total) by mouth daily.   docusate sodium (COLACE) 100 MG capsule Take 100 mg by mouth in the morning.   gabapentin (NEURONTIN) 600 MG tablet Take 600 mg by mouth 4 (four) times daily.   iron polysaccharides (NIFEREX) 150 MG capsule Take 1 capsule (150 mg total) by mouth daily.   isosorbide mononitrate (IMDUR) 30 MG 24 hr tablet Take 0.5 tablets (  15 mg total) by mouth daily.   LINZESS 290 MCG CAPS capsule Take 290 mcg by mouth daily as needed (constipation.).   lisinopril (ZESTRIL) 20 MG tablet Take 1 tablet (20 mg total) by mouth daily.   methocarbamol (ROBAXIN) 750 MG tablet Take 1 tablet (750 mg total) by mouth every 6 (six) hours.   metoprolol tartrate (LOPRESSOR) 25 MG tablet TAKE ONE-HALF TABLET BY MOUTH  TWICE DAILY   mometasone (ELOCON) 0.1 % cream Apply 1 Application topically daily as needed (hand rash).   naloxone (NARCAN) 4 MG/0.1ML LIQD nasal spray kit Place 1 spray into the nose as needed (opioid overdose).   nitroGLYCERIN (NITROSTAT) 0.4 MG SL tablet Place 0.4 mg under the tongue every 5 (five) minutes as needed for chest pain.   omeprazole (PRILOSEC) 40 MG capsule Take 1 capsule (40 mg total) by mouth 2 (two) times daily.   ondansetron (ZOFRAN) 8 MG tablet Take 1 tablet (8 mg total) by mouth every 8 (eight)  hours as needed for nausea or vomiting.   oxyCODONE (ROXICODONE) 15 MG immediate release tablet Take 15 mg by mouth every 4 (four) hours.   senna (SENOKOT) 8.6 MG TABS tablet Take 1 tablet (8.6 mg total) by mouth 2 (two) times daily as needed for mild constipation.   Tiotropium Bromide Monohydrate (SPIRIVA RESPIMAT) 2.5 MCG/ACT AERS Take 1 puff by mouth in the morning and at bedtime. (Patient taking differently: Take 1 puff by mouth in the morning.)   umeclidinium bromide (INCRUSE ELLIPTA) 62.5 MCG/ACT AEPB Inhale 1 puff into the lungs in the morning.   VENTOLIN HFA 108 (90 Base) MCG/ACT inhaler USE 1 TO 2 INHALATIONS BY MOUTH  INTO THE LUNGS EVERY 6 HOURS AS  NEEDED FOR SHORTNESS OF BREATH  OR WHEEZING   zolpidem (AMBIEN) 5 MG tablet Take 1 tablet (5 mg total) by mouth at bedtime as needed for sleep.   No facility-administered medications prior to visit.    Review of Systems All negative Except see HPI       Objective    BP 121/61 (BP Location: Left Arm, Patient Position: Sitting)   Pulse (!) 53   Temp 98.2 F (36.8 C) (Oral)   Resp 16   Ht 5\' 4"  (1.626 m)   Wt 184 lb (83.5 kg)   LMP 12/16/2017 (Approximate)   SpO2 99%   BMI 31.58 kg/m     Physical Exam   No results found for any visits on 09/19/23.      Assessment & Plan IV site swelling Localized swelling post-discharge, no erythema or significant pain, expected to resolve with conservative measures. - Apply heat with a towel to reduce swelling. - Gently massage the area. Take otc pain medications  RTC if symptoms persist or worsen . Anxiety Chronic and worsening.    09/19/2023    2:45 PM 08/19/2023    2:46 PM 08/08/2023    9:51 AM 06/03/2023    3:17 PM  GAD 7 : Generalized Anxiety Score  Nervous, Anxious, on Edge 1 1 1 1   Control/stop worrying 1 1 1 1   Worry too much - different things 1 1 1 1   Trouble relaxing 1 2 1 1   Restless 1 1 1 1   Easily annoyed or irritable 1 0 1 1  Afraid - awful might happen 0  0 0 0  Total GAD 7 Score 6 6 6 6   Anxiety Difficulty Not difficult at all Not difficult at all  Somewhat difficult   Increased anxiety, discussed  hydroxyzine as a temporary measure. Explained hydroxyzine's role and emphasized need for long-term management discussion with primary care provider. - Prescribe hydroxyzine 30 tablets, as needed, preferably at bedtime. - Discuss long-term anxiety management with primary care provider during follow-up. Will follow-up    No orders of the defined types were placed in this encounter.   No follow-ups on file.   The patient was advised to call back or seek an in-person evaluation if the symptoms worsen or if the condition fails to improve as anticipated.  I discussed the assessment and treatment plan with the patient. The patient was provided an opportunity to ask questions and all were answered. The patient agreed with the plan and demonstrated an understanding of the instructions.  I, Debera Lat, [name] have reviewed all documentation for this visit. The documentation on 09/19/2023  for the exam, diagnosis, procedures, and orders are all accurate and complete.  Debera Lat, [facility], MMS [facility] [phone] (phone) [phone] (fax)  [facility]

## 2023-09-19 NOTE — Telephone Encounter (Signed)
 Copied from CRM [id number]. Topic: Clinical - Red Word Triage >> Sep 19, 2023  9:48 AM Sara Munoz wrote: Red Word that prompted transfer to Nurse Triage: Patient called in, Patient was in the hospital  03/21//25 was discharged 09/16/23 Patient had an IV in left elbow, IV was taken out when discharged. There is now a lump where the IV was, lump is about 3 inches and goes up her arm. The lump showed up 5 minutes after the IV was taken out . 5 pain scale  Chief Complaint: Localized swelling Symptoms: Pain Frequency: Since Tuesday Pertinent Negatives: Patient denies fever Disposition: [] ED /[] Urgent Care (no appt availability in office) / [x] Appointment(In office/virtual)/ []  [facility]/ [] Home Care/ [] Refused Recommended Disposition /[] [facility]/ []  Follow-up with PCP Additional Notes: Patient called in to report localized swelling to the inner crease of her left arm. Patient was discharged from this hospital this week and swelling is located where her IV was. Patient reported swelling is 3 inches long and hard to the touch. Patient stated swelling was present before taking the IV out. Patient stated she mentioned something to hospital staff and it was never addressed. Patient is concerned because it has been a few days and swelling has not gone down. Patient denied redness and fever. This RN advised patient to see a provider within 24 hours, per protocol. No availability with PCP. This RN scheduled same day appointment with alternate provider in office. This RN advised patient to call back if symptoms worsen. Patient complied.   Reason for Disposition  [1] Swelling is painful to touch AND [2] no fever  Answer Assessment - Initial Assessment Questions 1. APPEARANCE of SWELLING: "What does it look like?"     Denies redness, state lump is hard to the touch, a little warm to the touch 2. SIZE: "How large is the swelling?" (e.g., inches, cm; or compare to size of pinhead, tip of pen,  eraser, coin, pea, grape, ping pong ball)      About 3 inches 3. LOCATION: "Where is the swelling located?"     Left arm- bend of elbow upwards 3 inches  4. ONSET: "When did the swelling start?"     Tuesday of this week 5. COLOR: "What color is it?" "Is there more than one color?"     Skin color 6. PAIN: "Is there any pain?" If Yes, ask: "How bad is the pain?" (e.g., scale 1-10; or mild, moderate, severe)     - NONE (0): no pain   - MILD (1-3): doesn't interfere with normal activities    - MODERATE (4-7): interferes with normal activities or awakens from sleep    - SEVERE (8-10): excruciating pain, unable to do any normal activities     About a 4, sore 7. ITCH: "Does it itch?" If Yes, ask: "How bad is the itch?"      Denies 8. CAUSE: "What do you think caused the swelling?"     IV  9 OTHER SYMPTOMS: "Do you have any other symptoms?" (e.g., fever)     Denies fever  Protocols used: Skin Lump or Localized Swelling-A-AH

## 2023-09-21 ENCOUNTER — Emergency Department

## 2023-09-21 ENCOUNTER — Other Ambulatory Visit: Payer: Self-pay

## 2023-09-21 DIAGNOSIS — I10 Essential (primary) hypertension: Secondary | ICD-10-CM | POA: Diagnosis not present

## 2023-09-21 DIAGNOSIS — R531 Weakness: Secondary | ICD-10-CM | POA: Insufficient documentation

## 2023-09-21 DIAGNOSIS — Z955 Presence of coronary angioplasty implant and graft: Secondary | ICD-10-CM | POA: Diagnosis not present

## 2023-09-21 DIAGNOSIS — G894 Chronic pain syndrome: Secondary | ICD-10-CM | POA: Insufficient documentation

## 2023-09-21 DIAGNOSIS — Z7951 Long term (current) use of inhaled steroids: Secondary | ICD-10-CM | POA: Insufficient documentation

## 2023-09-21 DIAGNOSIS — Z7982 Long term (current) use of aspirin: Secondary | ICD-10-CM | POA: Diagnosis not present

## 2023-09-21 DIAGNOSIS — I2510 Atherosclerotic heart disease of native coronary artery without angina pectoris: Secondary | ICD-10-CM | POA: Insufficient documentation

## 2023-09-21 DIAGNOSIS — J449 Chronic obstructive pulmonary disease, unspecified: Secondary | ICD-10-CM | POA: Insufficient documentation

## 2023-09-21 DIAGNOSIS — R110 Nausea: Secondary | ICD-10-CM | POA: Insufficient documentation

## 2023-09-21 DIAGNOSIS — J45909 Unspecified asthma, uncomplicated: Secondary | ICD-10-CM | POA: Diagnosis not present

## 2023-09-21 DIAGNOSIS — Z981 Arthrodesis status: Secondary | ICD-10-CM | POA: Diagnosis not present

## 2023-09-21 DIAGNOSIS — Z79899 Other long term (current) drug therapy: Secondary | ICD-10-CM | POA: Diagnosis not present

## 2023-09-21 DIAGNOSIS — R0602 Shortness of breath: Secondary | ICD-10-CM | POA: Insufficient documentation

## 2023-09-21 DIAGNOSIS — M791 Myalgia, unspecified site: Secondary | ICD-10-CM | POA: Diagnosis present

## 2023-09-21 LAB — CBC
HCT: 37 % (ref 36.0–46.0)
Hemoglobin: 10.8 g/dL — ABNORMAL LOW (ref 12.0–15.0)
MCH: 22.3 pg — ABNORMAL LOW (ref 26.0–34.0)
MCHC: 29.2 g/dL — ABNORMAL LOW (ref 30.0–36.0)
MCV: 76.4 fL — ABNORMAL LOW (ref 80.0–100.0)
Platelets: 280 10*3/uL (ref 150–400)
RBC: 4.84 MIL/uL (ref 3.87–5.11)
RDW: 30.3 % — ABNORMAL HIGH (ref 11.5–15.5)
WBC: 10 10*3/uL (ref 4.0–10.5)
nRBC: 0 % (ref 0.0–0.2)

## 2023-09-21 LAB — CBG MONITORING, ED: Glucose-Capillary: 105 mg/dL — ABNORMAL HIGH (ref 70–99)

## 2023-09-21 NOTE — ED Triage Notes (Signed)
 Pt BIB EMS with c/o generalized weakness that has been ongoing for a few weeks. Pt endorses nausea, SOB, and bodyaches Pt recently admitted for anemia and needing blood transfusions.    194/78

## 2023-09-21 NOTE — ED Notes (Addendum)
Cbg 105 

## 2023-09-22 ENCOUNTER — Emergency Department
Admission: EM | Admit: 2023-09-22 | Discharge: 2023-09-22 | Disposition: A | Discharge status: Home / Self Care | Attending: Emergency Medicine | Admitting: Emergency Medicine

## 2023-09-22 DIAGNOSIS — R531 Weakness: Secondary | ICD-10-CM | POA: Diagnosis not present

## 2023-09-22 DIAGNOSIS — G894 Chronic pain syndrome: Secondary | ICD-10-CM | POA: Diagnosis not present

## 2023-09-22 DIAGNOSIS — Z981 Arthrodesis status: Secondary | ICD-10-CM | POA: Diagnosis not present

## 2023-09-22 DIAGNOSIS — R0602 Shortness of breath: Secondary | ICD-10-CM | POA: Diagnosis not present

## 2023-09-22 LAB — RESP PANEL BY RT-PCR (RSV, FLU A&B, COVID)  RVPGX2
Influenza A by PCR: NEGATIVE
Influenza B by PCR: NEGATIVE
Resp Syncytial Virus by PCR: NEGATIVE
SARS Coronavirus 2 by RT PCR: NEGATIVE

## 2023-09-22 LAB — URINALYSIS, ROUTINE W REFLEX MICROSCOPIC
Bacteria, UA: NONE SEEN
Bilirubin Urine: NEGATIVE
Glucose, UA: NEGATIVE mg/dL
Hgb urine dipstick: NEGATIVE
Ketones, ur: NEGATIVE mg/dL
Nitrite: NEGATIVE
Protein, ur: NEGATIVE mg/dL
Specific Gravity, Urine: 1.01 (ref 1.005–1.030)
pH: 6 (ref 5.0–8.0)

## 2023-09-22 LAB — HEPATIC FUNCTION PANEL
ALT: 12 U/L (ref 0–44)
AST: 18 U/L (ref 15–41)
Albumin: 3.8 g/dL (ref 3.5–5.0)
Alkaline Phosphatase: 68 U/L (ref 38–126)
Bilirubin, Direct: 0.1 mg/dL (ref 0.0–0.2)
Total Bilirubin: <0.1 mg/dL (ref 0.0–1.2)
Total Protein: 7.1 g/dL (ref 6.5–8.1)

## 2023-09-22 LAB — BASIC METABOLIC PANEL WITH GFR
Anion gap: 10 (ref 5–15)
BUN: 8 mg/dL (ref 6–20)
CO2: 23 mmol/L (ref 22–32)
Calcium: 9.2 mg/dL (ref 8.9–10.3)
Chloride: 108 mmol/L (ref 98–111)
Creatinine, Ser: 0.74 mg/dL (ref 0.44–1.00)
GFR, Estimated: >60 mL/min
Glucose, Bld: 98 mg/dL (ref 70–99)
Potassium: 3.3 mmol/L — ABNORMAL LOW (ref 3.5–5.1)
Sodium: 141 mmol/L (ref 135–145)

## 2023-09-22 LAB — TROPONIN I (HIGH SENSITIVITY): Troponin I (High Sensitivity): 4 ng/L (ref <18)

## 2023-09-22 LAB — LIPASE, BLOOD: Lipase: 49 U/L (ref 11–51)

## 2023-09-22 MED ORDER — POTASSIUM CHLORIDE CRYS ER 20 MEQ PO TBCR
40.0000 meq | EXTENDED_RELEASE_TABLET | Freq: Once | ORAL | Status: AC
  Administered 2023-09-22: 40 meq via ORAL
  Filled 2023-09-22: qty 2

## 2023-09-22 MED ORDER — SODIUM CHLORIDE 0.9 % IV BOLUS (SEPSIS)
1000.0000 mL | Freq: Once | INTRAVENOUS | Status: AC
  Administered 2023-09-22: 1000 mL via INTRAVENOUS

## 2023-09-22 MED ORDER — MORPHINE SULFATE (PF) 4 MG/ML IV SOLN
4.0000 mg | Freq: Once | INTRAVENOUS | Status: AC
  Administered 2023-09-22: 4 mg via INTRAVENOUS
  Filled 2023-09-22: qty 1

## 2023-09-22 MED ORDER — ONDANSETRON HCL 4 MG/2ML IJ SOLN
4.0000 mg | Freq: Once | INTRAMUSCULAR | Status: AC
  Administered 2023-09-22: 4 mg via INTRAVENOUS
  Filled 2023-09-22: qty 2

## 2023-09-22 MED ORDER — HYDROMORPHONE HCL 1 MG/ML IJ SOLN
1.0000 mg | Freq: Once | INTRAMUSCULAR | Status: AC
  Administered 2023-09-22: 1 mg via INTRAVENOUS
  Filled 2023-09-22: qty 1

## 2023-09-22 NOTE — ED Notes (Signed)
 This RN introduced self to pt. Call light in reach, bed wheels locked, side rail raised, pt updated on plan of care. Rounding completed.

## 2023-09-22 NOTE — ED Provider Notes (Signed)
 [facility] Provider Note    Event Date/Time   First MD Initiated Contact with Patient 09/22/23 0033     (approximate)   History   Weakness   HPI  Sara Munoz is a 58 y.o. female with history of CAD, COPD, hypertension, chronic pain who presents to the emergency department complaints of pain all over.  She states she is taking her oxycodone at home without any relief.  She reports she has chronic shortness of breath which is unchanged.  No fevers, chills.  Has had some cough and congestion but states that this is also not abnormal for her.  States she feels very weak because of the pain.  She has had nausea but no vomiting, diarrhea.  States her legs are very restless due to the pain.  Denies that she has run out of her pain medication.   History provided by patient.    Past Medical History:  Diagnosis Date   Anemia    Anxiety    Aortic atherosclerosis (HCC)    Asthma    Atypical chest pain 08/02/2015   Carotid artery disease (HCC)    Chronic back pain    Chronic nausea    Chronic, continuous use of opioids 02/27/2016   a.) has naloxone Rx available   COPD (chronic obstructive pulmonary disease) (HCC)    Coronary artery disease    a.) LHC 2011: no sig CAD (report unavailable); b.) LHC 07/07/2014: 10-20% mLM, 25% LAD, 30-40% LCx, 20-30% RCA -- med mgmt; c.) LHC 02/22/2016: 25% LM, 25% pLCx --> med mgmt; d.) MPI 07/10/2017: no ischemia; e.) MPI 09/01/2019: no ischemia; f.) cCTA 10/28/2022: Ca score 1092 (99th percentile for age/sex/race match control)   Current smoker    DDD (degenerative disc disease), cervical    a.) s/p ACDF C5-C7 and PSIF C5-T1   DDD (degenerative disc disease), lumbar 05/23/2020   a.) s/p L5-S1 PLIF   GERD (gastroesophageal reflux disease)    Hepatic steatosis    Hepatitis C virus infection cured after antiviral drug therapy    a.) s/p Tx with standard glecaprevir-pibrentasvir course   Hypercholesteremia    Hyperkalemia     Hyperlipidemia    Hypertension    Hypokalemia    Hypomagnesemia 01/04/2014   Incomplete tear of left rotator cuff    Insomnia    a.) on hypnotic PRN (zolpidem)   MI (myocardial infarction) (HCC) 2012   Migraines    MSSA (methicillin susceptible Staphylococcus aureus) septicemia (HCC) 03/03/2014   a.) TEE (-) for valvular vegitation   Nerve pain    per patient, in the lower back   Osteoarthritis    Pneumonia    PTSD (post-traumatic stress disorder)    PVD (peripheral vascular disease) (HCC)    Tendinitis of left rotator cuff    TIA (transient ischemic attack) 2014    Past Surgical History:  Procedure Laterality Date   ANTERIOR CERVICAL DECOMP/DISCECTOMY FUSION N/A 03/30/2018   Procedure: Cervical six-seven Anterior cervical decompression/discectomy/fusion;  Surgeon: Jadene Pierini, MD;  [facility]: [facility];  Service: Neurosurgery;  Laterality: N/A;   ANTERIOR CERVICAL DECOMP/DISCECTOMY FUSION N/A 01/11/2019   Procedure: Cervical Five-Six Anterior cervical discectomy fusion,  Cervical Five to Cervical Seven anterior instrumented fusion;  Surgeon: Jadene Pierini, MD;  Location: [facility] OR;  Service: Neurosurgery;  Laterality: N/A;  Cervical Five-Six Anterior cervical discectomy fusion,  Cervical Five to Cervical Seven anterior instrumented fusion   APPLICATION OF INTRAOPERATIVE CT SCAN N/A 07/10/2023   Procedure:  APPLICATION OF INTRAOPERATIVE CT SCAN;  Surgeon: Lovenia Kim, MD;  Location: [facility] ORS;  Service: Neurosurgery;  Laterality: N/A;   BIOPSY  04/21/2023   Procedure: BIOPSY;  Surgeon: Toney Reil, MD;  Location: [facility] ENDOSCOPY;  Service: Gastroenterology;;   CARDIAC CATHETERIZATION Left 02/22/2016   Procedure: Left Heart Cath and Coronary Angiography;  Surgeon: Alwyn Pea, MD;  Location: [facility] INVASIVE CV LAB;  Service: Cardiovascular;  Laterality: Left;   CATARACT EXTRACTION W/ INTRAOCULAR LENS IMPLANT Right    COLONOSCOPY Left 09/15/2023   Procedure:  COLONOSCOPY;  Surgeon: Midge Minium, MD;  Location: [facility] ENDOSCOPY;  Service: Endoscopy;  Laterality: Left;   COLONOSCOPY WITH PROPOFOL N/A 09/19/2016   Procedure: COLONOSCOPY WITH PROPOFOL;  Surgeon: [name], MD;  Location: [facility] ENDOSCOPY;  Service: Endoscopy;  Laterality: N/A;   COLONOSCOPY WITH PROPOFOL N/A 04/21/2023   Procedure: COLONOSCOPY WITH PROPOFOL;  Surgeon: Toney Reil, MD;  Location: [facility] ENDOSCOPY;  Service: Gastroenterology;  Laterality: N/A;   DIAGNOSTIC LAPAROSCOPY     ESOPHAGOGASTRODUODENOSCOPY Left 09/15/2023   Procedure: EGD (ESOPHAGOGASTRODUODENOSCOPY);  Surgeon: Midge Minium, MD;  Location: [facility] ENDOSCOPY;  Service: Endoscopy;  Laterality: Left;   ESOPHAGOGASTRODUODENOSCOPY (EGD) WITH PROPOFOL N/A 09/18/2017   Procedure: ESOPHAGOGASTRODUODENOSCOPY (EGD) WITH PROPOFOL;  Surgeon: [name], MD;  Location: [facility] ENDOSCOPY;  Service: Gastroenterology;  Laterality: N/A;   ESOPHAGOGASTRODUODENOSCOPY (EGD) WITH PROPOFOL N/A 08/10/2019   Procedure: ESOPHAGOGASTRODUODENOSCOPY (EGD) WITH PROPOFOL;  Surgeon: [name], MD;  Location: [facility] ENDOSCOPY;  Service: Endoscopy;  Laterality: N/A;   ESOPHAGOGASTRODUODENOSCOPY (EGD) WITH PROPOFOL N/A 04/21/2023   Procedure: ESOPHAGOGASTRODUODENOSCOPY (EGD) WITH PROPOFOL;  Surgeon: Toney Reil, MD;  Location: [facility] ENDOSCOPY;  Service: Gastroenterology;  Laterality: N/A;   GIVENS CAPSULE STUDY N/A 09/15/2023   Procedure: IMAGING PROCEDURE, GI TRACT, INTRALUMINAL, VIA CAPSULE;  Surgeon: Midge Minium, MD;  Location: [facility] ENDOSCOPY;  Service: Endoscopy;  Laterality: N/A;   HEMORRHOID SURGERY     LEFT HEART CATH AND CORONARY ANGIOGRAPHY Left 07/07/2014   LEFT HEART CATH AND CORONARY ANGIOGRAPHY Left 2011   LUMBAR WOUND DEBRIDEMENT N/A 11/05/2022   Procedure: Thoracic one spinous process removal and wound revision;  Surgeon: Jadene Pierini, MD;  [facility];  Service: Neurosurgery;  Laterality: N/A;   NASAL SINUS SURGERY      ORIF FEMUR FRACTURE Left    ORIF TIBIA & FIBULA FRACTURES Right    OVARIAN CYST SURGERY     POSTERIOR CERVICAL FUSION/FORAMINOTOMY N/A 05/14/2021   Procedure: Cervical five, Cervical six Laminectomy,foraminotomy with Cervical five-six Cervical six-seven Cervical seven-Thoracic one Posterior instrumented fusion;  Surgeon: Jadene Pierini, MD;  Location: [facility];  Service: Neurosurgery;  Laterality: N/A;   SHOULDER ARTHROSCOPY WITH SUBACROMIAL DECOMPRESSION, ROTATOR CUFF REPAIR AND BICEP TENDON REPAIR Left 05/28/2022   Procedure: SHOULDER ARTHROSCOPY WITH DEBRIDEMENT, DECOMPRESSION, ROTATOR CUFF REPAIR AND BICEP TENDON REPAIR;  Surgeon: Christena Flake, MD;  Location: [facility] ORS;  Service: Orthopedics;  Laterality: Left;   SHOULDER ARTHROSCOPY WITH SUBACROMIAL DECOMPRESSION, ROTATOR CUFF REPAIR AND BICEP TENDON REPAIR Left 12/03/2022   Procedure: SHOULDER Extensive ARTHROSCOPY WITH DEBRIDEMENT, removal of Retained Foreign Body;  Surgeon: Christena Flake, MD;  Location: [facility] ORS;  Service: Orthopedics;  Laterality: Left;   SHOULDER ARTHROSCOPY WITH SUBACROMIAL DECOMPRESSION, ROTATOR CUFF REPAIR AND BICEP TENDON REPAIR Right 03/11/2023   Procedure: RIGHT SHOULDER ARTHROSCOPY WITH DEBRIDMENT, DECOMPRESSION and BICEPS TENODESIS.;  Surgeon: Christena Flake, MD;  Location: [facility] ORS;  Service: Orthopedics;  Laterality: Right;   THORACIC DISCECTOMY N/A 07/10/2023   Procedure:  T8-9 & T9-10 TRANSPEDICULAR DECOMPRESSION/DISCECTOMY;  Surgeon: Lovenia Kim, MD;  Location: [facility] ORS;  Service: Neurosurgery;  Laterality: N/A;   TRANSFORAMINAL LUMBAR INTERBODY FUSION W/ MIS 1 LEVEL Right 05/23/2020   Procedure: Right Lumbar Five Sacral One Minimally invasive transforaminal lumbar interbody fusion;  Surgeon: Jadene Pierini, MD;  Location: [facility];  Service: Neurosurgery;  Laterality: Right;  Right Lumbar Five Sacral One Minimally invasive transforaminal lumbar interbody fusion    MEDICATIONS:  Prior to Admission  medications   Medication Sig Start Date End Date Taking? Authorizing Provider  amLODipine (NORVASC) 5 MG tablet TAKE 1 TABLET BY MOUTH EVERYDAY AT BEDTIME 07/31/23   [name], [name], MD  ascorbic acid (VITAMIN C) 500 MG tablet Take 1 tablet (500 mg total) by mouth daily. 09/16/23 12/15/23  Gillis Santa, MD  aspirin EC 81 MG tablet Take 81 mg by mouth in the morning. Swallow whole.    [provider]  atorvastatin (LIPITOR) 40 MG tablet Take 1 tablet (40 mg total) by mouth every evening. 09/11/23   Debbe Odea, MD  cyanocobalamin 1000 MCG tablet Take 1 tablet (1,000 mcg total) by mouth daily. 09/17/23 10/17/23  Gillis Santa, MD  docusate sodium (COLACE) 100 MG capsule Take 100 mg by mouth in the morning.    [provider]  gabapentin (NEURONTIN) 600 MG tablet Take 600 mg by mouth 4 (four) times daily.    [provider]  hydrOXYzine (VISTARIL) 25 MG capsule Take 1 capsule (25 mg total) by mouth every 8 (eight) hours as needed. 09/19/23   Debera Lat, [name]  iron polysaccharides (NIFEREX) 150 MG capsule Take 1 capsule (150 mg total) by mouth daily. 09/16/23 12/15/23  Gillis Santa, MD  isosorbide mononitrate (IMDUR) 30 MG 24 hr tablet Take 0.5 tablets (15 mg total) by mouth daily. 08/27/23 11/25/23  Debbe Odea, MD  LINZESS 290 MCG CAPS capsule Take 290 mcg by mouth daily as needed (constipation.). 09/06/21   [provider]  lisinopril (ZESTRIL) 20 MG tablet Take 1 tablet (20 mg total) by mouth daily. 08/21/23   [name], Tawanna Cooler, MD  methocarbamol (ROBAXIN) 750 MG tablet Take 1 tablet (750 mg total) by mouth every 6 (six) hours. 07/14/23   Susanne Borders, PA  metoprolol tartrate (LOPRESSOR) 25 MG tablet TAKE ONE-HALF TABLET BY MOUTH  TWICE DAILY 07/23/23   [name], Tawanna Cooler, MD  mometasone (ELOCON) 0.1 % cream Apply 1 Application topically daily as needed (hand rash). 12/03/22   Poggi, Excell Seltzer, MD  naloxone [facility]) 4 MG/0.1ML LIQD  nasal spray kit Place 1 spray into the nose as needed (opioid overdose).    [provider]  nitroGLYCERIN (NITROSTAT) 0.4 MG SL tablet Place 0.4 mg under the tongue every 5 (five) minutes as needed for chest pain.    [provider]  omeprazole (PRILOSEC) 40 MG capsule Take 1 capsule (40 mg total) by mouth 2 (two) times daily. 04/14/23   [name], MD  ondansetron (ZOFRAN) 8 MG tablet Take 1 tablet (8 mg total) by mouth every 8 (eight) hours as needed for nausea or vomiting. 08/19/23   [name], Tawanna Cooler, MD  oxyCODONE (ROXICODONE) 15 MG immediate release tablet Take 15 mg by mouth every 4 (four) hours. 08/05/23   [provider]  senna (SENOKOT) 8.6 MG TABS tablet Take 1 tablet (8.6 mg total) by mouth 2 (two) times daily as needed for mild constipation. 07/14/23   Susanne Borders, PA  Tiotropium Bromide Monohydrate (SPIRIVA RESPIMAT) 2.5 MCG/ACT AERS Take 1  puff by mouth in the morning and at bedtime. Patient taking differently: Take 1 puff by mouth in the morning. 01/23/23   Jacky Kindle, FNP  umeclidinium bromide (INCRUSE ELLIPTA) 62.5 MCG/ACT AEPB Inhale 1 puff into the lungs in the morning.    [provider]  VENTOLIN HFA 108 (90 Base) MCG/ACT inhaler USE 1 TO 2 INHALATIONS BY MOUTH  INTO THE LUNGS EVERY 6 HOURS AS  NEEDED FOR SHORTNESS OF BREATH  OR WHEEZING 04/03/23   [name], [name], MD  zolpidem (AMBIEN) 5 MG tablet Take 1 tablet (5 mg total) by mouth at bedtime as needed for sleep. 08/19/23   Ronnald Ramp, MD    Physical Exam   Triage Vital Signs: ED Triage Vitals  Encounter Vitals Group     BP 09/21/23 2319 (!) 158/121     Systolic BP Percentile --      Diastolic BP Percentile --      Pulse Rate 09/21/23 2319 75     Resp 09/21/23 2319 18     Temp 09/21/23 2319 98.2 F (36.8 C)     Temp src --      SpO2 09/21/23 2319 95 %     Weight 09/21/23 2310 184 lb (83.5 kg)     Height --      Head Circumference --       Peak Flow --      Pain Score 09/21/23 2310 8     Pain Loc --      Pain Education --      Exclude from Growth Chart --     Most recent vital signs: Vitals:   09/22/23 0210 09/22/23 0306  BP: (!) 171/88   Pulse: 80   Resp: 20   Temp:  98.6 F (37 C)  SpO2: 99%     CONSTITUTIONAL: Alert, responds appropriately to questions.  Chronically ill-appearing, in no acute distress HEAD: Normocephalic, atraumatic EYES: Conjunctivae clear, pupils appear equal, sclera nonicteric ENT: normal nose; moist mucous membranes NECK: Supple, normal ROM CARD: RRR; S1 and S2 appreciated RESP: Normal chest excursion without splinting or tachypnea; breath sounds clear and equal bilaterally; no wheezes, no rhonchi, no rales, no hypoxia or respiratory distress, speaking full sentences ABD/GI: Non-distended; soft, non-tender, no rebound, no guarding, no peritoneal signs BACK: The back appears normal EXT: Normal ROM in all joints; no deformity noted, no edema, compartments soft, no joint effusions noted, extremities warm and well-perfused SKIN: Normal color for age and race; warm; no rash on exposed skin NEURO: Moves all extremities equally, normal speech, no facial asymmetry PSYCH: The patient's mood and manner are appropriate.   ED Results / Procedures / Treatments   LABS: (all labs ordered are listed, but only abnormal results are displayed) Labs Reviewed  BASIC METABOLIC PANEL WITH GFR - Abnormal; Notable for the following components:      Result Value   Potassium 3.3 (*)    All other components within normal limits  CBC - Abnormal; Notable for the following components:   Hemoglobin 10.8 (*)    MCV 76.4 (*)    MCH 22.3 (*)    MCHC 29.2 (*)    RDW 30.3 (*)    All other components within normal limits  URINALYSIS, ROUTINE W REFLEX MICROSCOPIC - Abnormal; Notable for the following components:   Color, Urine YELLOW (*)    APPearance CLEAR (*)    Leukocytes,Ua SMALL (*)    All other components  within normal limits  CBG MONITORING, ED -  Abnormal; Notable for the following components:   Glucose-Capillary 105 (*)    All other components within normal limits  RESP PANEL BY RT-PCR (RSV, FLU A&B, COVID)  [id number]  HEPATIC FUNCTION PANEL  LIPASE, BLOOD  TROPONIN I (HIGH SENSITIVITY)     EKG:  EKG Interpretation Date/Time:  Sunday September 21 2023 23:16:52 EDT Ventricular Rate:  68 PR Interval:  166 QRS Duration:  76 QT Interval:  414 QTC Calculation: 440 R Axis:   81  Text Interpretation: Normal sinus rhythm Cannot rule out Anterior infarct (cited on or before 27-Aug-2023) Abnormal ECG When compared with ECG of 12-Sep-2023 19:09, No significant change was found Confirmed by Rochele Raring [phone]) on 09/22/2023 12:44:26 AM         RADIOLOGY: My personal review and interpretation of imaging: Chest x-ray clear.  I have personally reviewed all radiology reports.   DG Chest 2 View Result Date: 09/22/2023 CLINICAL DATA:  Shortness of breath and generalized weakness EXAM: CHEST - 2 VIEW COMPARISON:  Radiograph and CT 09/12/2023 FINDINGS: Stable cardiomediastinal silhouette. Cervical and thoracic fusion hardware. Similar consolidation in the right middle lobe lateral which was shown to represent atelectasis on CT 09/12/2023. No new focal consolidation. No pleural effusion or pneumothorax. No displaced rib fractures. IMPRESSION: No active cardiopulmonary disease. Electronically Signed   By: Minerva Fester M.D.   On: 09/22/2023 00:08     PROCEDURES:  Critical Care performed: No     Procedures    IMPRESSION / MDM / ASSESSMENT AND PLAN / ED COURSE  I reviewed the triage vital signs and the nursing notes.    Patient here with diffuse pain.  Chronic shortness of breath and congestion.  Nausea and generalized weakness.  The patient is on the cardiac monitor to evaluate for evidence of arrhythmia and/or significant heart rate changes.   DIFFERENTIAL DIAGNOSIS (includes but not  limited to):   Anemia, electrolyte derangement, dehydration, UTI, acute exacerbation of chronic pain, COPD exacerbation, pneumonia, viral URI, less likely ACS   Patient's presentation is most consistent with acute presentation with potential threat to life or bodily function.   PLAN: Will obtain labs, urine, chest x-ray, EKG.  Will give IV fluids, pain and nausea medicine.   MEDICATIONS GIVEN IN ED: Medications  sodium chloride 0.9 % bolus 1,000 mL (0 mLs Intravenous Stopped 09/22/23 0213)  morphine (PF) 4 MG/ML injection 4 mg (4 mg Intravenous Given 09/22/23 0120)  ondansetron (ZOFRAN) injection 4 mg (4 mg Intravenous Given 09/22/23 0120)  potassium chloride SA (KLOR-CON M) CR tablet 40 mEq (40 mEq Oral Given 09/22/23 0121)  HYDROmorphone (DILAUDID) injection 1 mg (1 mg Intravenous Given 09/22/23 0213)     ED COURSE: Labs show hemoglobin of 10.8.  Potassium of 3.3.  Given oral replacement.  Normal glucose.  Urine shows no sign of infection or dehydration.  COVID, flu and RSV negative.  Chest x-ray reviewed and interpreted by myself and the radiologist and is clear.  No wheezing or hypoxia here.  Negative troponin.  EKG nonischemic.  No arrhythmia.  Initially hypertensive likely from pain and this has improved somewhat.  Pain has improved but not resolved.  I suspect that this may be an acute exacerbation of her chronic pain.  No sign of any life-threatening process today and I feel she is safe for discharge.  Recommended close follow-up with her outpatient doctors.   At this time, I do not feel there is any life-threatening condition present. I reviewed all nursing notes, vitals, pertinent  previous records.  All lab and urine results, EKGs, imaging ordered have been independently reviewed and interpreted by myself.  I reviewed all available radiology reports from any imaging ordered this visit.  Based on my assessment, I feel the patient is safe to be discharged home without further emergent workup  and can continue workup as an outpatient as needed. Discussed all findings, treatment plan as well as usual and customary return precautions.  They verbalize understanding and are comfortable with this plan.  Outpatient follow-up has been provided as needed.  All questions have been answered.    CONSULTS:  none   OUTSIDE RECORDS REVIEWED: Reviewed patient's admission on 09/16/2023 for symptomatic anemia requiring 2 units of packed red blood cells.       FINAL CLINICAL IMPRESSION(S) / ED DIAGNOSES   Final diagnoses:  Chronic pain syndrome  Generalized weakness     Rx / DC Orders   ED Discharge Orders     None        Note:  This document was prepared using Dragon voice recognition software and may include unintentional dictation errors.   Yadriel Kerrigan, Layla Maw, DO 09/22/23 [phone]

## 2023-09-23 ENCOUNTER — Telehealth: Payer: Self-pay | Admitting: Family Medicine

## 2023-09-23 DIAGNOSIS — I10 Essential (primary) hypertension: Secondary | ICD-10-CM

## 2023-09-23 NOTE — Telephone Encounter (Signed)
 Lisinopril was sent in 08/21/2023 to [facility] for a 90 qty R:3  Need to verify with patient before sending a new prescription to [facility]

## 2023-09-23 NOTE — Telephone Encounter (Signed)
 [facility] faxed refill request for the following medications:   lisinopril (ZESTRIL) 20 MG tablet    Please advise.

## 2023-09-25 ENCOUNTER — Telehealth: Payer: Self-pay | Admitting: Family Medicine

## 2023-09-25 DIAGNOSIS — I10 Essential (primary) hypertension: Secondary | ICD-10-CM

## 2023-09-25 MED ORDER — LISINOPRIL 20 MG PO TABS: 20.0000 mg | ORAL_TABLET | Freq: Every day | ORAL | 3 refills | Status: AC

## 2023-09-25 NOTE — Telephone Encounter (Signed)
 refilled

## 2023-09-25 NOTE — Telephone Encounter (Signed)
 [facility] is requesting refill lisinopril (ZESTRIL) 20 MG tablet  Please advise

## 2023-09-29 ENCOUNTER — Other Ambulatory Visit: Payer: Self-pay | Admitting: Family Medicine

## 2023-09-29 DIAGNOSIS — M4804 Spinal stenosis, thoracic region: Secondary | ICD-10-CM

## 2023-09-30 ENCOUNTER — Ambulatory Visit: Admitting: Pulmonary Disease

## 2023-09-30 ENCOUNTER — Encounter: Payer: Self-pay | Admitting: Pulmonary Disease

## 2023-09-30 VITALS — BP 110/78 | HR 55 | Temp 97.1°F | Ht 64.0 in | Wt 176.0 lb

## 2023-09-30 DIAGNOSIS — R0602 Shortness of breath: Secondary | ICD-10-CM

## 2023-09-30 DIAGNOSIS — F17210 Nicotine dependence, cigarettes, uncomplicated: Secondary | ICD-10-CM

## 2023-09-30 DIAGNOSIS — R059 Cough, unspecified: Secondary | ICD-10-CM | POA: Diagnosis not present

## 2023-09-30 DIAGNOSIS — Z720 Tobacco use: Secondary | ICD-10-CM

## 2023-09-30 MED ORDER — AEROCHAMBER MV MISC: 0 refills | Status: DC

## 2023-09-30 MED ORDER — NICOTINE POLACRILEX 2 MG MT LOZG: 2.0000 mg | LOZENGE | OROMUCOSAL | 3 refills | Status: DC | PRN

## 2023-09-30 MED ORDER — NICOTINE 14 MG/24HR TD PT24: 14.0000 mg | MEDICATED_PATCH | TRANSDERMAL | 0 refills | Status: AC

## 2023-09-30 MED ORDER — BREZTRI AEROSPHERE 160-9-4.8 MCG/ACT IN AERO: 2.0000 | INHALATION_SPRAY | Freq: Two times a day (BID) | RESPIRATORY_TRACT | 3 refills | Status: AC

## 2023-09-30 MED ORDER — IPRATROPIUM-ALBUTEROL 0.5-2.5 (3) MG/3ML IN SOLN: 3.0000 mL | Freq: Four times a day (QID) | RESPIRATORY_TRACT | 2 refills | Status: DC | PRN

## 2023-09-30 MED ORDER — ALBUTEROL SULFATE HFA 108 (90 BASE) MCG/ACT IN AERS: 2.0000 | INHALATION_SPRAY | Freq: Four times a day (QID) | RESPIRATORY_TRACT | 2 refills | Status: AC | PRN

## 2023-09-30 MED ORDER — BREZTRI AEROSPHERE 160-9-4.8 MCG/ACT IN AERO: 2.0000 | INHALATION_SPRAY | Freq: Two times a day (BID) | RESPIRATORY_TRACT | 0 refills | Status: DC

## 2023-09-30 NOTE — Progress Notes (Signed)
 Synopsis: Referred in by [name], Makie*   Subjective:   PATIENT ID: Sara Munoz GENDER: female DOB: July 22, 1965, MRN: [id number]  Chief Complaint  Patient presents with   New Patient (Initial Visit)    COPD. DOE. No wheezing. Occasional cough.     HPI Sara Munoz is a pleasant 58 year old female patient with a past medical history of tobacco use, clinical diagnosis of COPD, hypertension and hyperlipidemia presenting today to the pulmonary clinic to establish care.  She was previously diagnosed with COPD and has been on multiple inhaler including Symbicort, Incruse, Spiriva, Dulera and it is unclear to me which ones we are currently using.  Her main symptoms are cough is sometimes productive, shortness of breath on exertion and intermittent wheezing specifically when she gets a URI.  She was never hospitalized for any respiratory issues.  Family history -her aunt and mom both with lung cancer and COPD who were smokers.  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  She has 2 dogs at home.  No kids.  ROS All systems were reviewed and are negative except for the above Objective:   Vitals:   09/30/23 0912  BP: 110/78  Pulse: (!) 55  Temp: (!) 97.1 F (36.2 C)  SpO2: 97%  Weight: 176 lb (79.8 kg)  Height: 5\' 4"  (1.626 m)   97% on RA BMI Readings from Last 3 Encounters:  09/30/23 30.21 kg/m  09/21/23 31.58 kg/m  09/19/23 31.58 kg/m   Wt Readings from Last 3 Encounters:  09/30/23 176 lb (79.8 kg)  09/21/23 184 lb (83.5 kg)  09/19/23 184 lb (83.5 kg)    Physical Exam GEN: NAD, Healthy Appearing HEENT: Supple Neck, Reactive Pupils, EOMI  CVS: Normal S1, Normal S2, RRR, No murmurs or ES appreciated  Lungs: Clear bilateral air entry.  Abdomen: Soft, non tender, non distended, + BS  Extremities:  Warm and well perfused, No edema  Skin: No suspicious lesions appreciated  Psych: Normal Affect  Ancillary Information   CBC    Component Value Date/Time   WBC 10.0 09/21/2023 2114   RBC 4.84 09/21/2023 2114   HGB 10.8 (L) 09/21/2023 2114   HGB 11.3 04/17/2023 1426   HCT 37.0 09/21/2023 2114   HCT 37.6 04/17/2023 1426   PLT 280 09/21/2023 2114   PLT 278 04/17/2023 1426   MCV 76.4 (L) 09/21/2023 2114   MCV 76 (L) 04/17/2023 1426   MCH 22.3 (L) 09/21/2023 2114   MCHC 29.2 (L) 09/21/2023 2114   RDW 30.3 (H) 09/21/2023 2114   RDW 18.6 (H) 04/17/2023 1426   LYMPHSABS 2.6 04/17/2023 1426   MONOABS 0.7 12/12/2021 0910   EOSABS 0.2 04/17/2023 1426   BASOSABS 0.1 04/17/2023 1426  Labs and imaging were reviewed.     No data to display           Assessment & Plan:  Sara Munoz is a pleasant 58 year old female patient with a past medical history of tobacco use, clinical diagnosis of COPD, hypertension and hyperlipidemia presenting today to the pulmonary clinic to establish care.  # Clinical diagnosis of COPD pending PFTs  []  PFTs []  Start budesonide-formoterol-glycopyrrolate [Breztri] 160-4.52 puffs twice a day.  Advised mouth rinsing with water after each use.  Inhaler use training with [employer] provided. []  Okay to continue with albuterol and DuoNebs on an as-needed  basis.  # Tobacco use disorder 120 pack/year.  Currently cut back to 7 cigarettes a day.  Discussed at length strategy for smoking cessation.  Provided NRT.  []  Enrolled in lung cancer screening program.  Return in about 3 months (around 12/30/2023).  I spent 60 minutes caring for this patient today, including preparing to see the patient, obtaining a medical history , reviewing a separately obtained history, performing a medically appropriate examination and/or evaluation, counseling and educating the patient/family/caregiver, ordering medications, tests, or procedures, documenting clinical information in the  electronic health record, and independently interpreting results (not separately reported/billed) and communicating results to the patient/family/caregiver  Janann Colonel, MD [facility] 09/30/2023 9:41 AM

## 2023-09-30 NOTE — Addendum Note (Signed)
 Addended by: Bonney Leitz on: 09/30/2023 10:03 AM   Modules accepted: Orders

## 2023-10-01 ENCOUNTER — Encounter: Payer: 59 | Admitting: Physician Assistant

## 2023-10-02 ENCOUNTER — Encounter: Payer: Self-pay | Admitting: Family Medicine

## 2023-10-02 ENCOUNTER — Ambulatory Visit (INDEPENDENT_AMBULATORY_CARE_PROVIDER_SITE_OTHER): Payer: Self-pay | Admitting: Family Medicine

## 2023-10-02 VITALS — BP 112/68 | HR 59 | Ht 64.0 in | Wt 174.0 lb

## 2023-10-02 DIAGNOSIS — I10 Essential (primary) hypertension: Secondary | ICD-10-CM

## 2023-10-02 DIAGNOSIS — F411 Generalized anxiety disorder: Secondary | ICD-10-CM

## 2023-10-02 DIAGNOSIS — E781 Pure hyperglyceridemia: Secondary | ICD-10-CM

## 2023-10-02 DIAGNOSIS — D508 Other iron deficiency anemias: Secondary | ICD-10-CM

## 2023-10-02 DIAGNOSIS — E875 Hyperkalemia: Secondary | ICD-10-CM | POA: Diagnosis not present

## 2023-10-02 DIAGNOSIS — E782 Mixed hyperlipidemia: Secondary | ICD-10-CM | POA: Diagnosis not present

## 2023-10-02 DIAGNOSIS — E876 Hypokalemia: Secondary | ICD-10-CM

## 2023-10-02 MED ORDER — CLONAZEPAM 0.5 MG PO TABS
0.5000 mg | ORAL_TABLET | Freq: Two times a day (BID) | ORAL | 0 refills | Status: DC | PRN
Start: 2023-10-02 — End: 2023-11-06

## 2023-10-02 NOTE — Progress Notes (Signed)
 Established patient visit   Patient: Sara Munoz   DOB: 12/29/1965   58 y.o. Female  MRN: [id number] Visit Date: 10/02/2023  Today's healthcare provider: Mimi Alt, MD   Chief Complaint  Patient presents with   Hypertension    Bp recheck no concerns   Subjective     HPI     Hypertension    Additional comments: Bp recheck no concerns      Last edited by Bart Lieu, CMA on 10/02/2023  2:21 PM.       Discussed the use of AI scribe software for clinical note transcription with the patient, who gave verbal consent to proceed.  History of Present Illness Sara Munoz is a 58 year old female with hypertension who presents for a follow-up visit.  Her hypertension is chronic but well-controlled with a blood pressure reading of 112/68 mmHg. She is currently taking amlodipine 5 mg daily and atorvastatin 40 mg daily for hyperlipidemia.  She recently visited the emergency department on March 31st for pain, during which she was taking oxycodone without relief. Her hemoglobin was 10.8 g/dL, and her potassium level was 3.3 mEq/L, for which she received oral potassium replacement. Urine studies were normal, and tests for COVID, flu, and RSV were negative. A chest x-ray was normal, and she was neither wheezing nor hypoxic. Her troponins were negative, and her hypertension had resolved by the time of discharge. She is interested in following up on her potassium levels.  She experiences significant anxiety, describing it as severe. She has tried hydroxyzine and Buspar in the past without success. She has a history of using Xanax, Klonopin, Valium, and Ativan, but prefers not to return to Xanax due to its strength. She is currently under a lot of stress at home, living with her boyfriend and his brother, which she describes as a difficult situation. Her boyfriend has a drinking problem, and she is responsible for all the bills, which adds to her stress. She wants to  move out of this situation, believing it will help reduce her anxiety.  She reports muscle cramping in her arm, particularly when moving it in a certain way, and has been trying to maintain her potassium levels. She describes her mood as 'edgy.'     Past Medical History:  Diagnosis Date   Anemia    Anxiety    Aortic atherosclerosis (HCC)    Asthma    Atypical chest pain 08/02/2015   Carotid artery disease (HCC)    Chronic back pain    Chronic nausea    Chronic, continuous use of opioids 02/27/2016   a.) has naloxone Rx available   COPD (chronic obstructive pulmonary disease) (HCC)    Coronary artery disease    a.) LHC 2011: no sig CAD (report unavailable); b.) LHC 07/07/2014: 10-20% mLM, 25% LAD, 30-40% LCx, 20-30% RCA -- med mgmt; c.) LHC 02/22/2016: 25% LM, 25% pLCx --> med mgmt; d.) MPI 07/10/2017: no ischemia; e.) MPI 09/01/2019: no ischemia; f.) cCTA 10/28/2022: Ca score 1092 (99th percentile for age/sex/race match control)   Current smoker    DDD (degenerative disc disease), cervical    a.) s/p ACDF C5-C7 and PSIF C5-T1   DDD (degenerative disc disease), lumbar 05/23/2020   a.) s/p L5-S1 PLIF   GERD (gastroesophageal reflux disease)    Hepatic steatosis    Hepatitis C virus infection cured after antiviral drug therapy    a.) s/p Tx with standard glecaprevir-pibrentasvir course   Hypercholesteremia  Hyperkalemia    Hyperlipidemia    Hypertension    Hypokalemia    Hypomagnesemia 01/04/2014   Incomplete tear of left rotator cuff    Insomnia    a.) on hypnotic PRN (zolpidem)   MI (myocardial infarction) (HCC) 2012   Migraines    MSSA (methicillin susceptible Staphylococcus aureus) septicemia (HCC) 03/03/2014   a.) TEE (-) for valvular vegitation   Nerve pain    per patient, in the lower back   Osteoarthritis    Pneumonia    PTSD (post-traumatic stress disorder)    PVD (peripheral vascular disease) (HCC)    Tendinitis of left rotator cuff    TIA (transient  ischemic attack) 2014    Medications: Outpatient Medications Prior to Visit  Medication Sig   albuterol (VENTOLIN HFA) 108 (90 Base) MCG/ACT inhaler Inhale 2 puffs into the lungs every 6 (six) hours as needed for wheezing or shortness of breath.   amLODipine (NORVASC) 5 MG tablet TAKE 1 TABLET BY MOUTH EVERYDAY AT BEDTIME   ascorbic acid (VITAMIN C) 500 MG tablet Take 1 tablet (500 mg total) by mouth daily.   aspirin EC 81 MG tablet Take 81 mg by mouth in the morning. Swallow whole.   atorvastatin (LIPITOR) 40 MG tablet Take 1 tablet (40 mg total) by mouth every evening.   budeson-glycopyrrolate-formoterol (BREZTRI AEROSPHERE) 160-9-4.8 MCG/ACT AERO inhaler Inhale 2 puffs into the lungs in the morning and at bedtime.   budeson-glycopyrrolate-formoterol (BREZTRI AEROSPHERE) 160-9-4.8 MCG/ACT AERO inhaler Inhale 2 puffs into the lungs in the morning and at bedtime.   cyanocobalamin 1000 MCG tablet Take 1 tablet (1,000 mcg total) by mouth daily.   docusate sodium (COLACE) 100 MG capsule Take 100 mg by mouth in the morning.   gabapentin (NEURONTIN) 600 MG tablet Take 600 mg by mouth 4 (four) times daily.   hydrOXYzine (VISTARIL) 25 MG capsule Take 1 capsule (25 mg total) by mouth every 8 (eight) hours as needed.   ipratropium-albuterol (DUONEB) 0.5-2.5 (3) MG/3ML SOLN Take 3 mLs by nebulization every 6 (six) hours as needed.   iron polysaccharides (NIFEREX) 150 MG capsule Take 1 capsule (150 mg total) by mouth daily.   isosorbide mononitrate (IMDUR) 30 MG 24 hr tablet Take 0.5 tablets (15 mg total) by mouth daily.   LINZESS 290 MCG CAPS capsule Take 290 mcg by mouth daily as needed (constipation.).   lisinopril (ZESTRIL) 20 MG tablet Take 1 tablet (20 mg total) by mouth daily.   methocarbamol (ROBAXIN) 750 MG tablet Take 1 tablet (750 mg total) by mouth every 6 (six) hours.   metoprolol tartrate (LOPRESSOR) 25 MG tablet TAKE ONE-HALF TABLET BY MOUTH  TWICE DAILY   mometasone (ELOCON) 0.1 % cream  Apply 1 Application topically daily as needed (hand rash).   naloxone (NARCAN) 4 MG/0.1ML LIQD nasal spray kit Place 1 spray into the nose as needed (opioid overdose).   nicotine (NICODERM CQ - DOSED IN MG/24 HOURS) 14 mg/24hr patch Place 1 patch (14 mg total) onto the skin daily for 14 days.   nicotine polacrilex (NICOTINE MINI) 2 MG lozenge Take 1 lozenge (2 mg total) by mouth every 2 (two) hours as needed.   nitroGLYCERIN (NITROSTAT) 0.4 MG SL tablet Place 0.4 mg under the tongue every 5 (five) minutes as needed for chest pain.   omeprazole (PRILOSEC) 40 MG capsule Take 1 capsule (40 mg total) by mouth 2 (two) times daily.   ondansetron (ZOFRAN) 8 MG tablet Take 1 tablet (8 mg total) by  mouth every 8 (eight) hours as needed for nausea or vomiting.   oxyCODONE (ROXICODONE) 15 MG immediate release tablet Take 15 mg by mouth every 4 (four) hours.   senna (SENOKOT) 8.6 MG TABS tablet Take 1 tablet (8.6 mg total) by mouth 2 (two) times daily as needed for mild constipation.   Spacer/Aero-Holding Chambers (AEROCHAMBER MV) inhaler Use as instructed   Tiotropium Bromide Monohydrate (SPIRIVA RESPIMAT) 2.5 MCG/ACT AERS Take 1 puff by mouth in the morning and at bedtime. (Patient taking differently: Take 1 puff by mouth in the morning.)   umeclidinium bromide (INCRUSE ELLIPTA) 62.5 MCG/ACT AEPB Inhale 1 puff into the lungs in the morning.   VENTOLIN HFA 108 (90 Base) MCG/ACT inhaler USE 1 TO 2 INHALATIONS BY MOUTH  INTO THE LUNGS EVERY 6 HOURS AS  NEEDED FOR SHORTNESS OF BREATH  OR WHEEZING   zolpidem (AMBIEN) 5 MG tablet Take 1 tablet (5 mg total) by mouth at bedtime as needed for sleep.   No facility-administered medications prior to visit.    Review of Systems  Last CBC Lab Results  Component Value Date   WBC 10.0 09/21/2023   HGB 10.8 (L) 09/21/2023   HCT 37.0 09/21/2023   MCV 76.4 (L) 09/21/2023   MCH 22.3 (L) 09/21/2023   RDW 30.3 (H) 09/21/2023   PLT 280 09/21/2023   Last metabolic  panel Lab Results  Component Value Date   GLUCOSE 98 09/21/2023   NA 141 09/21/2023   K 3.3 (L) 09/21/2023   CL 108 09/21/2023   CO2 23 09/21/2023   BUN 8 09/21/2023   CREATININE 0.74 09/21/2023   GFRNONAA >60 09/21/2023   CALCIUM 9.2 09/21/2023   PHOS 2.7 09/16/2023   PROT 7.1 09/21/2023   ALBUMIN 3.8 09/21/2023   LABGLOB 2.5 01/06/2023   AGRATIO 1.6 04/03/2022   BILITOT <0.1 09/21/2023   ALKPHOS 68 09/21/2023   AST 18 09/21/2023   ALT 12 09/21/2023   ANIONGAP 10 09/21/2023   Last lipids Lab Results  Component Value Date   CHOL 268 (H) 04/03/2022   HDL 37 (L) 04/03/2022   LDLCALC 179 (H) 04/03/2022   TRIG 268 (H) 04/03/2022   CHOLHDL 7.2 (H) 04/03/2022    Last hemoglobin A1c Lab Results  Component Value Date   HGBA1C 6.0 (H) 01/06/2023   Last thyroid functions Lab Results  Component Value Date   TSH 1.060 01/06/2023   Last vitamin D Lab Results  Component Value Date   25OHVITD2 <1.0 03/01/2016   25OHVITD3 32 03/01/2016   VD25OH 35.49 09/14/2023   Last vitamin B12 and Folate Lab Results  Component Value Date   VITAMINB12 317 09/13/2023   FOLATE 24.0 09/13/2023        Objective    BP 112/68   Pulse (!) 59   Ht 5\' 4"  (1.626 m)   Wt 174 lb (78.9 kg)   LMP 12/16/2017 (Approximate)   SpO2 97%   BMI 29.87 kg/m  BP Readings from Last 3 Encounters:  10/02/23 112/68  09/30/23 110/78  09/22/23 (!) 171/88   Wt Readings from Last 3 Encounters:  10/02/23 174 lb (78.9 kg)  09/30/23 176 lb (79.8 kg)  09/21/23 184 lb (83.5 kg)        Physical Exam  Physical Exam VITALS: BP- 112/68 CHEST: Lungs clear to auscultation bilaterally.    No results found for any visits on 10/02/23.  Assessment & Plan     Problem List Items Addressed This Visit  Cardiovascular and Mediastinum   Essential hypertension - Primary     Assessment & Plan Chronic Pain Chronic pain with recent emergency department visit for pain management. Previous  treatment with oxycodone was ineffective.  Anxiety Chronic anxiety exacerbated by current stressful living situation. Previous treatments with hydroxyzine and Buspar were ineffective. She prefers to avoid Xanax due to its potency. Klonopin is preferred as it is less potent and has been effective in the past without causing excessive sedation. - Prescribe Klonopin 0.5 mg to 1 mg twice a day - Schedule follow-up in one month to assess response to treatment  Hypokalemia Recent hypokalemia with potassium level of 3.3 mmol/L during emergency department visit. Potassium was replaced orally at that time. She reports arm cramping, possibly related to electrolyte imbalance. - Order basic metabolic panel to recheck potassium levels  Anemia Recent anemia with hemoglobin level of 10.8 g/dL during emergency department visit. She received two blood transfusions during hospitalization. - Order CBC to recheck hemoglobin levels  Hypertension Chronic hypertension, well-controlled with current medication regimen. Blood pressure is 112/68 mmHg. - Continue amlodipine 5 mg daily  Hyperlipidemia Chronic hyperlipidemia with previous lipid panel in 2023 showing elevated LDL at 179 mg/dL, triglycerides at 657 mg/dL, total cholesterol at 846 mg/dL, and low HDL at 37 mg/dL. ASCVD score unable to be calculated. - Continue atorvastatin 40 mg daily - Order lipid panel today     No follow-ups on file.         Mimi Alt, MD  [facility] [phone] (phone) [phone] (fax)  [facility]

## 2023-10-03 DIAGNOSIS — G894 Chronic pain syndrome: Secondary | ICD-10-CM | POA: Diagnosis not present

## 2023-10-03 DIAGNOSIS — M792 Neuralgia and neuritis, unspecified: Secondary | ICD-10-CM | POA: Diagnosis not present

## 2023-10-03 DIAGNOSIS — M79621 Pain in right upper arm: Secondary | ICD-10-CM | POA: Diagnosis not present

## 2023-10-03 DIAGNOSIS — M79622 Pain in left upper arm: Secondary | ICD-10-CM | POA: Diagnosis not present

## 2023-10-03 DIAGNOSIS — Z79891 Long term (current) use of opiate analgesic: Secondary | ICD-10-CM | POA: Diagnosis not present

## 2023-10-03 LAB — CBC
Hematocrit: 46.8 % — ABNORMAL HIGH (ref 34.0–46.6)
Hemoglobin: 14 g/dL (ref 11.1–15.9)
MCH: 23.5 pg — ABNORMAL LOW (ref 26.6–33.0)
MCHC: 29.9 g/dL — ABNORMAL LOW (ref 31.5–35.7)
MCV: 79 fL (ref 79–97)
Platelets: 366 10*3/uL (ref 150–450)
RBC: 5.95 x10E6/uL — ABNORMAL HIGH (ref 3.77–5.28)
RDW: 27.8 % — ABNORMAL HIGH (ref 11.7–15.4)
WBC: 8.8 10*3/uL (ref 3.4–10.8)

## 2023-10-03 LAB — LIPID PANEL
Chol/HDL Ratio: 8 ratio — ABNORMAL HIGH (ref 0.0–4.4)
Cholesterol, Total: 249 mg/dL — ABNORMAL HIGH (ref 100–199)
HDL: 31 mg/dL — ABNORMAL LOW (ref >39)
LDL Chol Calc (NIH): 118 mg/dL — ABNORMAL HIGH (ref 0–99)
Triglycerides: 560 mg/dL (ref 0–149)
VLDL Cholesterol Cal: 100 mg/dL — ABNORMAL HIGH (ref 5–40)

## 2023-10-03 LAB — BMP8+EGFR
BUN/Creatinine Ratio: 19 (ref 9–23)
BUN: 14 mg/dL (ref 6–24)
CO2: 23 mmol/L (ref 20–29)
Calcium: 10 mg/dL (ref 8.7–10.2)
Chloride: 97 mmol/L (ref 96–106)
Creatinine, Ser: 0.73 mg/dL (ref 0.57–1.00)
Glucose: 99 mg/dL (ref 70–99)
Potassium: 5.9 mmol/L — ABNORMAL HIGH (ref 3.5–5.2)
Sodium: 137 mmol/L (ref 134–144)
eGFR: 97 mL/min/{1.73_m2} (ref >59)

## 2023-10-04 ENCOUNTER — Encounter: Payer: Self-pay | Admitting: Family Medicine

## 2023-10-04 DIAGNOSIS — E781 Pure hyperglyceridemia: Secondary | ICD-10-CM | POA: Insufficient documentation

## 2023-10-04 MED ORDER — FENOFIBRATE 48 MG PO TABS: 48.0000 mg | ORAL_TABLET | Freq: Every day | ORAL | 2 refills | Status: DC

## 2023-10-04 MED ORDER — LOKELMA 5 G PO PACK
10.0000 g | PACK | Freq: Every day | ORAL | 0 refills | Status: AC
Start: 2023-10-04 — End: 2023-10-07

## 2023-10-06 ENCOUNTER — Encounter: Payer: Self-pay | Admitting: Physician Assistant

## 2023-10-06 ENCOUNTER — Ambulatory Visit
Admission: RE | Admit: 2023-10-06 | Discharge: 2023-10-06 | Disposition: A | Discharge status: Home / Self Care | Source: Ambulatory Visit | Attending: Physician Assistant | Admitting: Physician Assistant

## 2023-10-06 ENCOUNTER — Ambulatory Visit (INDEPENDENT_AMBULATORY_CARE_PROVIDER_SITE_OTHER): Admitting: Physician Assistant

## 2023-10-06 ENCOUNTER — Ambulatory Visit
Admission: RE | Admit: 2023-10-06 | Discharge: 2023-10-06 | Disposition: A | Discharge status: Home / Self Care | Attending: Physician Assistant | Admitting: Physician Assistant

## 2023-10-06 VITALS — BP 118/76 | Ht 64.0 in | Wt 174.0 lb

## 2023-10-06 DIAGNOSIS — Z09 Encounter for follow-up examination after completed treatment for conditions other than malignant neoplasm: Secondary | ICD-10-CM

## 2023-10-06 DIAGNOSIS — G992 Myelopathy in diseases classified elsewhere: Secondary | ICD-10-CM | POA: Diagnosis not present

## 2023-10-06 DIAGNOSIS — Z4789 Encounter for other orthopedic aftercare: Secondary | ICD-10-CM | POA: Diagnosis not present

## 2023-10-06 DIAGNOSIS — M4804 Spinal stenosis, thoracic region: Secondary | ICD-10-CM | POA: Insufficient documentation

## 2023-10-06 NOTE — Addendum Note (Signed)
 Addended by: Bart Lieu on: 10/06/2023 11:26 AM   Modules accepted: Orders

## 2023-10-06 NOTE — Progress Notes (Unsigned)
   DOS: 07/10/2023, thoracic decompression and fusion  HISTORY OF PRESENT ILLNESS: 10/06/2023 [name]  Going to pain management, helping some.  Back pain has improved, still having low back pain. Si joints. Middle of back and into butt cheeks.      is status post thoracic decompression and fusion for myelopathy.  She has had some reinnervation type symptoms in her lower extremities with intermittent paresthesias.  She states that she is cut down on smoking significantly.  She has had mild appetite.  Has not had any postoperative issues since her last visit.  PHYSICAL EXAMINATION:   Vitals:   10/06/23 1457  BP: 118/76   General: Patient is well developed, well nourished, calm, collected, and in no apparent distress.  NEUROLOGICAL:  General: In no acute distress.  Awake, alert, oriented to person, place, and time. Pupils equal round and reactive to light.   Strength:  Side Iliopsoas Quads Hamstring PF DF EHL  R 5 5 5 5 5 5   L 5 5 5 5 5 5    Incision is showing evidence of delayed healing.  Some eschar present no redness drainage or active areas of opening  ROS (Neurologic): Negative except as noted above  IMAGING: I reviewed her x-rays from today.  They are not formally read, however they do show that the hardware is in good position without any evidence of acute failure  ASSESSMENT/PLAN:  Sara Munoz is doing well after thoracic decompression and fusion.  She is doing well.  She does continue to smoke we reiterated the importance for her to cease smoking for both wound healing and for fusion.  She states that she has been cutting down but has not been able to quit completely.  From a neurologic standpoint she continues to be stable.  She does show some delayed healing of her wound with eschar present, no evidence of drainage or redness or signs of infection.  Will plan to have her continue to follow-up for her 100-month appointment.  She is working with physical therapy.   She is getting around well.  She does continue to have pain as expected.  Carroll Clamp, MD/MS Department of Neurosurgery

## 2023-10-09 ENCOUNTER — Ambulatory Visit: Payer: 59 | Admitting: Neurology

## 2023-10-24 ENCOUNTER — Encounter (INDEPENDENT_AMBULATORY_CARE_PROVIDER_SITE_OTHER): Payer: Self-pay

## 2023-10-30 ENCOUNTER — Encounter (HOSPITAL_COMMUNITY): Payer: Self-pay

## 2023-10-31 DIAGNOSIS — M79661 Pain in right lower leg: Secondary | ICD-10-CM | POA: Diagnosis not present

## 2023-10-31 DIAGNOSIS — M545 Low back pain, unspecified: Secondary | ICD-10-CM | POA: Diagnosis not present

## 2023-10-31 DIAGNOSIS — Z79891 Long term (current) use of opiate analgesic: Secondary | ICD-10-CM | POA: Diagnosis not present

## 2023-10-31 DIAGNOSIS — G894 Chronic pain syndrome: Secondary | ICD-10-CM | POA: Diagnosis not present

## 2023-10-31 DIAGNOSIS — M542 Cervicalgia: Secondary | ICD-10-CM | POA: Diagnosis not present

## 2023-11-01 ENCOUNTER — Other Ambulatory Visit: Payer: Self-pay | Admitting: Family Medicine

## 2023-11-01 DIAGNOSIS — I10 Essential (primary) hypertension: Secondary | ICD-10-CM

## 2023-11-03 NOTE — Telephone Encounter (Signed)
 Requested Prescriptions  Pending Prescriptions Disp Refills   amLODipine  (NORVASC ) 5 MG tablet [Pharmacy Med Name: AMLODIPINE  BESYLATE 5 MG TAB] 90 tablet 1    Sig: TAKE 1 TABLET BY MOUTH EVERYDAY AT BEDTIME     Cardiovascular: Calcium  Channel Blockers 2 Passed - 11/03/2023  5:04 PM      Passed - Last BP in normal range    BP Readings from Last 1 Encounters:  10/06/23 118/76         Passed - Last Heart Rate in normal range    Pulse Readings from Last 1 Encounters:  10/02/23 (!) 59         Passed - Valid encounter within last 6 months    Recent Outpatient Visits           1 month ago Essential hypertension   [facility] [place], [place], MD   1 month ago Swelling at injection site   [facility] [place], [place], [name]   2 months ago Encounter for [employer] annual wellness exam   [place] [facility] [place], [place], MD   2 months ago Sore throat   [facility] [place], [place], [name]       Future Appointments             In 2 weeks Festus Hubert, Olive Better, DO [facility] [place] [facility]   In 3 weeks Ronald Cockayne, NP [facility]

## 2023-11-06 ENCOUNTER — Encounter: Payer: Self-pay | Admitting: Family Medicine

## 2023-11-06 ENCOUNTER — Other Ambulatory Visit: Payer: Self-pay | Admitting: Neurosurgery

## 2023-11-06 ENCOUNTER — Telehealth: Admitting: Family Medicine

## 2023-11-06 DIAGNOSIS — M5010 Cervical disc disorder with radiculopathy, unspecified cervical region: Secondary | ICD-10-CM | POA: Diagnosis not present

## 2023-11-06 DIAGNOSIS — F411 Generalized anxiety disorder: Secondary | ICD-10-CM

## 2023-11-06 DIAGNOSIS — M47812 Spondylosis without myelopathy or radiculopathy, cervical region: Secondary | ICD-10-CM | POA: Diagnosis not present

## 2023-11-06 DIAGNOSIS — G5600 Carpal tunnel syndrome, unspecified upper limb: Secondary | ICD-10-CM | POA: Diagnosis not present

## 2023-11-06 MED ORDER — CLONAZEPAM 1 MG PO TABS
1.0000 mg | ORAL_TABLET | Freq: Two times a day (BID) | ORAL | 1 refills | Status: DC | PRN
Start: 2023-11-06 — End: 2023-12-24

## 2023-11-06 NOTE — Assessment & Plan Note (Signed)
 Chronic anxiety with persistent high stress levels. Klonopin  (clonazepam ) 0.5 mg to 1 mg twice daily was initiated one month ago, resulting in approximately 23% symptom improvement. Stressors include financial pressures and interpersonal issues. Previous treatments with hydroxyzine  and Buspar  were ineffective. She prefers Klonopin  over Xanax  due to past positive experiences. - Increase Klonopin  to 1 mg twice daily. - Prescribe 60 tablets with one refill. - Advise follow-up as needed, with virtual visits as an option.

## 2023-11-06 NOTE — Progress Notes (Signed)
 MyChart Video Visit    Virtual Visit via Video Note   This format is felt to be most appropriate for this patient at this time. Physical exam was limited by quality of the video and audio technology used for the visit.   Patient location: Patient's home address   Provider location: [facility]  [address]  [place], [place] [address]   I discussed the limitations of evaluation and management by telemedicine and the availability of in person appointments. The patient expressed understanding and agreed to proceed.  Patient: Sara Munoz   DOB: 1965-10-23   58 y.o. Female  MRN: [id number] Visit Date: 11/06/2023  Today's healthcare provider: Mimi Alt, MD   No chief complaint on file.  Subjective    HPI   Discussed the use of AI scribe software for clinical note transcription with the patient, who gave verbal consent to proceed.  History of Present Illness Sara Munoz is a 59 year old female who presents for follow-up on her anxiety management.  She was started on Klonopin  0.5 mg to 1 mg twice daily as needed during her last visit one month ago. The medication is helping somewhat, although her stress levels remain high. She tries not to take two doses a day but sometimes finds it necessary. She estimates feeling about 23% better since starting the medication. Previous treatments with hydroxyzine  and Buspar  were ineffective.  She is experiencing significant stress due to personal circumstances, including financial pressures and the presence of her boyfriend's brother, who is unemployed and contributing to her stress. She is also concerned about her health, as she is facing another cervical spine surgery due to a herniated disc, which is causing her significant discomfort.  She is currently managing her pain with oxycodone  and plans to return to Suboxone  after her surgeries to reduce her reliance on pain medications.      Past  Medical History:  Diagnosis Date   Anemia    Anxiety    Aortic atherosclerosis (HCC)    Asthma    Atypical chest pain 08/02/2015   Carotid artery disease (HCC)    Chronic back pain    Chronic nausea    Chronic, continuous use of opioids 02/27/2016   a.) has naloxone  Rx available   COPD (chronic obstructive pulmonary disease) (HCC)    Coronary artery disease    a.) LHC 2011: no sig CAD (report unavailable); b.) LHC 07/07/2014: 10-20% mLM, 25% LAD, 30-40% LCx, 20-30% RCA -- med mgmt; c.) LHC 02/22/2016: 25% LM, 25% pLCx --> med mgmt; d.) MPI 07/10/2017: no ischemia; e.) MPI 09/01/2019: no ischemia; f.) cCTA 10/28/2022: Ca score 1092 (99th percentile for age/sex/race match control)   Current smoker    DDD (degenerative disc disease), cervical    a.) s/p ACDF C5-C7 and PSIF C5-T1   DDD (degenerative disc disease), lumbar 05/23/2020   a.) s/p L5-S1 PLIF   GERD (gastroesophageal reflux disease)    Hepatic steatosis    Hepatitis C virus infection cured after antiviral drug therapy    a.) s/p Tx with standard glecaprevir-pibrentasvir course   Hypercholesteremia    Hyperkalemia    Hyperlipidemia    Hypertension    Hypokalemia    Hypomagnesemia 01/04/2014   Incomplete tear of left rotator cuff    Insomnia    a.) on hypnotic PRN (zolpidem )   MI (myocardial infarction) (HCC) 2012   Migraines    MSSA (methicillin susceptible Staphylococcus aureus) septicemia (HCC) 03/03/2014  a.) TEE (-) for valvular vegitation   Nerve pain    per patient, in the lower back   Osteoarthritis    Pneumonia    PTSD (post-traumatic stress disorder)    PVD (peripheral vascular disease) (HCC)    Tendinitis of left rotator cuff    TIA (transient ischemic attack) 2014    Medications: Outpatient Medications Prior to Visit  Medication Sig   albuterol  (VENTOLIN  HFA) 108 (90 Base) MCG/ACT inhaler Inhale 2 puffs into the lungs every 6 (six) hours as needed for wheezing or shortness of breath.   amLODipine   (NORVASC ) 5 MG tablet TAKE 1 TABLET BY MOUTH EVERYDAY AT BEDTIME   ascorbic acid  (VITAMIN C) 500 MG tablet Take 1 tablet (500 mg total) by mouth daily.   aspirin  EC 81 MG tablet Take 81 mg by mouth in the morning. Swallow whole.   atorvastatin  (LIPITOR) 40 MG tablet Take 1 tablet (40 mg total) by mouth every evening.   budeson-glycopyrrolate -formoterol  (BREZTRI  AEROSPHERE) 160-9-4.8 MCG/ACT AERO inhaler Inhale 2 puffs into the lungs in the morning and at bedtime.   docusate sodium  (COLACE) 100 MG capsule Take 100 mg by mouth in the morning.   fenofibrate  (TRICOR ) 48 MG tablet Take 1 tablet (48 mg total) by mouth daily.   gabapentin  (NEURONTIN ) 600 MG tablet Take 600 mg by mouth 4 (four) times daily.   ipratropium-albuterol  (DUONEB) 0.5-2.5 (3) MG/3ML SOLN Take 3 mLs by nebulization every 6 (six) hours as needed.   iron  polysaccharides (NIFEREX) 150 MG capsule Take 1 capsule (150 mg total) by mouth daily.   isosorbide  mononitrate (IMDUR ) 30 MG 24 hr tablet Take 0.5 tablets (15 mg total) by mouth daily.   LINZESS  290 MCG CAPS capsule Take 290 mcg by mouth daily as needed (constipation.).   lisinopril  (ZESTRIL ) 20 MG tablet Take 1 tablet (20 mg total) by mouth daily.   LOFENA 25 MG TABS Take by mouth.   metoprolol  tartrate (LOPRESSOR ) 25 MG tablet TAKE ONE-HALF TABLET BY MOUTH  TWICE DAILY   mometasone  (ELOCON ) 0.1 % cream Apply 1 Application topically daily as needed (hand rash).   naloxone  (NARCAN ) 4 MG/0.1ML LIQD nasal spray kit Place 1 spray into the nose as needed (opioid overdose).   nicotine  polacrilex (NICOTINE  MINI) 2 MG lozenge Take 1 lozenge (2 mg total) by mouth every 2 (two) hours as needed.   nitroGLYCERIN  (NITROSTAT ) 0.4 MG SL tablet Place 0.4 mg under the tongue every 5 (five) minutes as needed for chest pain.   NORGESIC FORTE 50-770-60 MG tablet Take by mouth.   omeprazole  (PRILOSEC) 40 MG capsule Take 1 capsule (40 mg total) by mouth 2 (two) times daily.   ondansetron  (ZOFRAN ) 8  MG tablet Take 1 tablet (8 mg total) by mouth every 8 (eight) hours as needed for nausea or vomiting.   oxyCODONE  (ROXICODONE ) 15 MG immediate release tablet Take 15 mg by mouth every 4 (four) hours.   Prenatal Vit-Fe Gluconate-FA (AZESCO) 13-1 MG TABS Take 1 tablet by mouth daily.   senna (SENOKOT) 8.6 MG TABS tablet Take 1 tablet (8.6 mg total) by mouth 2 (two) times daily as needed for mild constipation.   Tiotropium Bromide  Monohydrate (SPIRIVA  RESPIMAT) 2.5 MCG/ACT AERS Take 1 puff by mouth in the morning and at bedtime. (Patient taking differently: Take 1 puff by mouth in the morning.)   umeclidinium bromide  (INCRUSE ELLIPTA ) 62.5 MCG/ACT AEPB Inhale 1 puff into the lungs in the morning.   VENTOLIN  HFA 108 (90 Base) MCG/ACT inhaler  USE 1 TO 2 INHALATIONS BY MOUTH  INTO THE LUNGS EVERY 6 HOURS AS  NEEDED FOR SHORTNESS OF BREATH  OR WHEEZING   zolpidem  (AMBIEN ) 5 MG tablet Take 1 tablet (5 mg total) by mouth at bedtime as needed for sleep.   [DISCONTINUED] budeson-glycopyrrolate -formoterol  (BREZTRI  AEROSPHERE) 160-9-4.8 MCG/ACT AERO inhaler Inhale 2 puffs into the lungs in the morning and at bedtime.   [DISCONTINUED] clonazePAM  (KLONOPIN ) 0.5 MG tablet Take 1-2 tablets (0.5-1 mg total) by mouth 2 (two) times daily as needed for anxiety.   [DISCONTINUED] hydrOXYzine  (VISTARIL ) 25 MG capsule Take 1 capsule (25 mg total) by mouth every 8 (eight) hours as needed.   [DISCONTINUED] Spacer/Aero-Holding Chambers (AEROCHAMBER MV) inhaler Use as instructed   No facility-administered medications prior to visit.    Review of Systems      Objective    LMP 12/16/2017 (Approximate)       Physical Exam Constitutional:      General: She is not in acute distress.    Appearance: Normal appearance. She is normal weight. She is not ill-appearing, toxic-appearing or diaphoretic.     Comments: Well groomed, calmly sitting in her home  Neurological:     Mental Status: She is alert.  Psychiatric:         Attention and Perception: Attention and perception normal. She is attentive. She does not perceive auditory or visual hallucinations.        Mood and Affect: Mood and affect normal.        Speech: Speech normal.        Behavior: Behavior normal. Behavior is cooperative.        Thought Content: Thought content normal. Thought content is not paranoid or delusional. Thought content does not include homicidal or suicidal ideation. Thought content does not include homicidal or suicidal plan.        Judgment: Judgment normal.     Physical Exam       Assessment & Plan     Problem List Items Addressed This Visit       Other   GAD (generalized anxiety disorder) - Primary   Chronic anxiety with persistent high stress levels. Klonopin  (clonazepam ) 0.5 mg to 1 mg twice daily was initiated one month ago, resulting in approximately 23% symptom improvement. Stressors include financial pressures and interpersonal issues. Previous treatments with hydroxyzine  and Buspar  were ineffective. She prefers Klonopin  over Xanax  due to past positive experiences. - Increase Klonopin  to 1 mg twice daily. - Prescribe 60 tablets with one refill. - Advise follow-up as needed, with virtual visits as an option.      Relevant Medications   clonazePAM  (KLONOPIN ) 1 MG tablet    Assessment & Plan Cervical disc disorder with radiculopathy Cervical disc disorder with radiculopathy, with a new herniated disc causing significant discomfort. She is consulting with a surgeon for potential surgical intervention due to lack of relief from current management.  Chronic pain management Chronic pain management ongoing with oxycodone . She plans to transition back to Suboxone  post-surgery to manage pain and reduce opioid reliance, with agreement from her pain management physician.       No follow-ups on file.     I discussed the assessment and treatment plan with the patient. The patient was provided an opportunity  to ask questions and all were answered. The patient agreed with the plan and demonstrated an understanding of the instructions.   The patient was advised to call back or seek an in-person evaluation if the symptoms worsen  or if the condition fails to improve as anticipated.  I provided 20 minutes of non-face-to-face time during this encounter.   Mimi Alt, MD [facility] [phone] (phone) [phone] (fax)  [facility]

## 2023-11-14 ENCOUNTER — Encounter: Payer: Self-pay | Admitting: Neurology

## 2023-11-18 ENCOUNTER — Other Ambulatory Visit: Payer: Self-pay

## 2023-11-18 ENCOUNTER — Ambulatory Visit
Admission: RE | Admit: 2023-11-18 | Discharge: 2023-11-18 | Disposition: A | Discharge status: Home / Self Care | Source: Ambulatory Visit | Attending: Neurosurgery | Admitting: Neurosurgery

## 2023-11-18 DIAGNOSIS — R202 Paresthesia of skin: Secondary | ICD-10-CM

## 2023-11-18 DIAGNOSIS — M47812 Spondylosis without myelopathy or radiculopathy, cervical region: Secondary | ICD-10-CM | POA: Insufficient documentation

## 2023-11-18 DIAGNOSIS — Z981 Arthrodesis status: Secondary | ICD-10-CM | POA: Diagnosis not present

## 2023-11-18 DIAGNOSIS — M542 Cervicalgia: Secondary | ICD-10-CM | POA: Diagnosis not present
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
## 2023-11-19 ENCOUNTER — Encounter: Payer: Self-pay | Admitting: Neurology

## 2023-11-19 ENCOUNTER — Ambulatory Visit (INDEPENDENT_AMBULATORY_CARE_PROVIDER_SITE_OTHER): Admitting: Neurology

## 2023-11-19 ENCOUNTER — Other Ambulatory Visit: Payer: Self-pay | Admitting: Neurology

## 2023-11-19 VITALS — BP 162/92 | HR 100 | Ht 64.0 in | Wt 177.0 lb

## 2023-11-19 DIAGNOSIS — G8929 Other chronic pain: Secondary | ICD-10-CM | POA: Diagnosis not present

## 2023-11-19 DIAGNOSIS — M542 Cervicalgia: Secondary | ICD-10-CM | POA: Diagnosis not present

## 2023-11-19 DIAGNOSIS — G43009 Migraine without aura, not intractable, without status migrainosus: Secondary | ICD-10-CM

## 2023-11-19 MED ORDER — AJOVY 225 MG/1.5ML ~~LOC~~ SOAJ: 225.0000 mg | SUBCUTANEOUS | 5 refills | Status: DC

## 2023-11-19 NOTE — Patient Instructions (Signed)
  Start Ajovy injection every 28 days.  If no improvement in 3 months, contact me STOP TYLENOL  AND BC POWDER Take Ubrelvy  at earliest onset of headache.  May repeat dose once in 2 hours if needed.  Maximum 2 tablets in 24 hours. Let me know if it helps Limit use of pain relievers to no more than 9 days out of the month.  These medications include acetaminophen , NSAIDs (ibuprofen /Advil /Motrin , naproxen /Aleve , triptans (Imitrex /sumatriptan ), Excedrin, and narcotics.  This will help reduce risk of rebound headaches. Be aware of common food triggers Routine exercise Stay adequately hydrated (aim for 64 oz water  daily) Keep headache diary Maintain proper stress management Maintain proper sleep hygiene Do not skip meals Consider supplements:  magnesium  citrate 400mg  daily, riboflavin 400mg  daily, coenzyme Q10 300mg  daily

## 2023-11-19 NOTE — Progress Notes (Signed)
 Medication Samples have been provided to the patient.  Drug name: Jairo Mayer       Strength: 100mg         Qty: 4  LOT: [id number]  Exp.Date: 12/26  Dosing instructions: as needed  The patient has been instructed regarding the correct time, dose, and frequency of taking this medication, including desired effects and most common side effects.   Michalene Agee 11:41 AM 11/19/2023

## 2023-11-20 ENCOUNTER — Telehealth: Payer: Self-pay | Admitting: Pharmacy Technician

## 2023-11-20 ENCOUNTER — Other Ambulatory Visit (HOSPITAL_COMMUNITY): Payer: Self-pay

## 2023-11-20 NOTE — Telephone Encounter (Signed)
 PA has been submitted, and telephone encounter has been created. Please see telephone encounter dated 5.29.25.

## 2023-11-20 NOTE — Telephone Encounter (Signed)
 Pharmacy Patient Advocate Encounter   Received notification from RX Request Messages that prior authorization for AJOVY 225MG  is required/requested.   Insurance verification completed.   The patient is insured through [facility] .   Per test claim: PA required; PA submitted to above mentioned insurance via CoverMyMeds Key/confirmation #/EOC [id number] Status is pending

## 2023-11-25 NOTE — Telephone Encounter (Signed)
 Pharmacy Patient Advocate Encounter  Received notification from [employer] that Prior Authorization for Ajovy  has been DENIED.  Full denial letter will be uploaded to the media tab. See denial reason below.   Ajovy  autoinjector is denied because it is not on your plan's Drug List (formulary). Medication authorization requires the following: (1) You need to try two (2) of these covered drugs: (a) Emgality autoinjector*. (b) Qulipta*. (2) OR your doctor needs to give us  specific medical reasons why two (2) of the covered drug(s) are not appropriate for you.

## 2023-11-26 MED ORDER — QULIPTA 60 MG PO TABS
60.0000 mg | ORAL_TABLET | Freq: Every day | ORAL | 5 refills | Status: DC
Start: 2023-11-26 — End: 2024-02-10

## 2023-11-26 NOTE — Telephone Encounter (Signed)
 Patient Sara Munoz of Dr.Jaffe note, Her insurance prefers two other medications in the same family, which is fine.  I would like to start Qulipta 60mg  daily (a daily pill).  I like to give this medication a 3 month trial, so if there is no improvement after 3 months, contact us  and we can change treatment.  If she is agreeable, please send in prescription for QULIPTA 60MG  - TAKE 1 TABLET DAILY.  QTY 30, REFILL 5    Qulipta sent to the [facility] per patient.

## 2023-11-27 ENCOUNTER — Ambulatory Visit: Attending: Cardiology | Admitting: Cardiology

## 2023-11-27 ENCOUNTER — Encounter: Payer: Self-pay | Admitting: Cardiology

## 2023-11-27 VITALS — BP 133/78 | HR 63 | Ht 64.0 in | Wt 180.6 lb

## 2023-11-27 DIAGNOSIS — E782 Mixed hyperlipidemia: Secondary | ICD-10-CM

## 2023-11-27 DIAGNOSIS — I10 Essential (primary) hypertension: Secondary | ICD-10-CM | POA: Diagnosis not present

## 2023-11-27 DIAGNOSIS — I2510 Atherosclerotic heart disease of native coronary artery without angina pectoris: Secondary | ICD-10-CM

## 2023-11-27 DIAGNOSIS — Z862 Personal history of diseases of the blood and blood-forming organs and certain disorders involving the immune mechanism: Secondary | ICD-10-CM

## 2023-11-27 DIAGNOSIS — Z720 Tobacco use: Secondary | ICD-10-CM

## 2023-11-27 DIAGNOSIS — E781 Pure hyperglyceridemia: Secondary | ICD-10-CM | POA: Diagnosis not present

## 2023-11-27 MED ORDER — ISOSORBIDE MONONITRATE ER 30 MG PO TB24: 30.0000 mg | ORAL_TABLET | Freq: Every day | ORAL | 3 refills | Status: AC

## 2023-11-27 NOTE — Patient Instructions (Signed)
 Medication Instructions:  Your physician recommends the following medication changes.  INCREASE: Imdur  to 30 mg daily  *If you need a refill on your cardiac medications before your next appointment, please call your pharmacy*  Lab Work: No labs ordered today  If you have labs (blood work) drawn today and your tests are completely normal, you will receive your results only by: MyChart Message (if you have MyChart) OR A paper copy in the mail If you have any lab test that is abnormal or we need to change your treatment, we will call you to review the results.  Testing/Procedures: No test ordered today   Follow-Up: At [facility], you and your health needs are our priority.  As part of our continuing mission to provide you with exceptional heart care, our providers are all part of one team.  This team includes your primary Cardiologist (physician) and Advanced Practice Providers or APPs (Physician Assistants and Nurse Practitioners) who all work together to provide you with the care you need, when you need it.  Your next appointment:   6 month(s)  Provider:   Constancia Delton, MD or Ronald Cockayne, NP

## 2023-11-27 NOTE — Progress Notes (Signed)
 Cardiology Office Note   Date:  11/27/2023  ID:  LULANI BOUR, DOB 09-15-1965, MRN [id number] PCP: Mimi Alt, MD  [facility] Providers Cardiologist:  Constancia Delton, MD     History of Present Illness Sara Munoz is a 58 y.o. female with a past medical history of nonobstructive coronary artery disease, mixed hyperlipidemia, primary hypertension, smoker of 30+ years, COPD, anxiety, asthma, carotid artery disease, migraines, osteoarthritis, PTSD, peripheral vascular disease, TIA, who presents for hospital follow-up for symptomatic anemia.  Previously underwent left heart catheterization in 2017 which revealed 25% left main, 25% proximal left circumflex stenosis.  Echocardiogram was completed in 2021 with an EF of 55 to 60%.  Lexiscan  Myoview  in 08/2019 revealed no ischemia and was considered low risk study.  Repeat echocardiogram completed 09/2022 with an EF of 60 to 65%.  She underwent coronary CTA on 10/2022 which revealed mild left main, proximal LAD, left circumflex stenosis 25 to 49%.  She was last seen in clinic 09/03/2023 by Dr. Junnie Olives.  At that time she had complaints of occasional chest pain.  She still continued to smoke but cut back was working on quitting.  She attributed her chest pain to being stressed out at home due to her boyfriend drinking and the patient having to be on the bills.  She was started on Imdur  15 mg daily.  She was hospitalized at [facility] from 3/21 - 09/16/2023 for symptomatic anemia.  She was transfused 2 units of PRBCs.  Hemoglobin responded appropriately to 2 units.  She was worked up for iron  deficiency anemia as an outpatient setting around 03/2023.  She underwent EGD and colonoscopy.  EGD was notable for erythematous mucosa of the stomach with pathology negative for H. pylori.  Colonoscopy was limited due to poor prep.  She was returned in 3 months to continue iron  supplementations as well as to stop BC powder use.  Hemoglobin had  dropped to 7.7.  She was to undergo a capsule endoscopic and the capsule did not exit the stomach so GI recommended follow-up as an outpatient.  She was restarted on omeprazole  40 mg twice daily.  She was treated with IV iron  and vitamin B12 injections followed by oral supplementation.  She was continued on her antihypertensive and antianginal medications.  She returns to clinic today stating overall she has been doing fairly well since discharge from the hospital previously due to symptomatic anemia.  She send hemoglobin has returned to normal but they never found the source of her bleeding.  She states that she continues to have occasional breakthrough chest discomfort but it has improved since recently started on Imdur  at her last visit.  She states that she has been compliant with her current medication regimen with any undue side effects.  ROS: 10 point review of systems has been reviewed and considered negative the exception was been listed in the HPI  Studies Reviewed     cCTA 10/28/2022 IMPRESSION: 1. Coronary calcium  score of 1092. This was 99th percentile for age and sex matched control.   2. Normal coronary origin with left dominance.   3. Mild LM, proximal LAD and LCx stenosis (25-49%).   4. Minimal RCA stenosis (<25%).   5. CAD-RADS 2. Mild non-obstructive CAD (25-49%). Consider non-atherosclerotic causes of chest pain. Consider preventive therapy and risk factor modification.   2D echo 10/21/2022  1. Left ventricular ejection fraction, by estimation, is 60 to 65%. The  left ventricle has normal function. The left ventricle has no  regional  wall motion abnormalities. There is mild left ventricular hypertrophy.  Left ventricular diastolic parameters  were normal.   2. Right ventricular systolic function is normal. The right ventricular  size is normal.   3. The mitral valve is normal in structure. Mild mitral valve  regurgitation.   4. The aortic valve was not well  visualized. Aortic valve regurgitation  is not visualized. No aortic stenosis is present.   5. The inferior vena cava is normal in size with greater than 50%  respiratory variability, suggesting right atrial pressure of 3 mmHg.   Lexiscan  MPI 09/01/2019 Blood pressure demonstrated a normal response to exercise. There was no ST segment deviation noted during stress. The study is normal. This is a low risk study. The left ventricular ejection fraction is normal (55-65%).    Risk Assessment/Calculations           Physical Exam VS:  BP 133/78   Pulse 63   Ht 5\' 4"  (1.626 m)   Wt 180 lb 9.6 oz (81.9 kg)   LMP 12/16/2017 (Approximate)   SpO2 96%   BMI 31.00 kg/m    Wt Readings from Last 3 Encounters:  11/27/23 180 lb 9.6 oz (81.9 kg)  11/19/23 177 lb (80.3 kg)  10/06/23 174 lb (78.9 kg)    GEN: Well nourished, well developed in no acute distress NECK: No JVD; No carotid bruits CARDIAC: RRR, no murmurs, rubs, gallops RESPIRATORY:  Clear to auscultation without rales, wheezing or rhonchi  ABDOMEN: Soft, non-tender, non-distended EXTREMITIES:  No edema; No deformity   ASSESSMENT AND PLAN Mild nonobstructive coronary artery disease noted in the left main and LAD and left circumflex.  She continues to have occasional chest discomfort with being recently placed on Imdur  with huge improvement.  Will increase Imdur  to 30 mg daily today.  She is continued on aspirin  81 mg daily, Lipitor 40 mg daily, fenofibrate  48 mg daily.  No further ischemic testing needed at this time.  Mixed hyperlipidemia with most recent LDL of 118 with a goal of less than 70.  She has been continued on atorvastatin  40 mg daily and fenofibrate  48 mg daily.  Huge improvement since last LDL of 179.  She has upcoming labs scheduled with her PCP in the next month.  If continued LDL above goal consider starting ezetimibe 10 mg daily.  Recently started on fenofibrate  for triglycerides of 560.  Primary hypertension with a  blood pressure today 133/70.  Blood pressures remain stable.  She has been continued on amlodipine  5 mg daily, Imdur  30 mg daily, lisinopril  20 mg daily, metoprolol  tartrate 12.5 mg twice daily.  She has been encouraged to monitor pressure 1 to 2 hours postmedication administration as well.  Recent hospitalization for symptomatic anemia where she received 2 units of PRBCs.  Recently had labs completed by her primary care provider and hemoglobin is back to normal at 14.  She denies any further issues with bleeding.  She is continued on iron  supplements.  COPD with continued smoking with total cessation continue to be recommended.       Dispo: Patient return to clinic to see MD/APP in 6 months or sooner if needed for reevaluation.  Signed, Kanita Delage, NP

## 2023-11-28 ENCOUNTER — Other Ambulatory Visit: Payer: Self-pay

## 2023-11-28 DIAGNOSIS — M5136 Other intervertebral disc degeneration, lumbar region with discogenic back pain only: Secondary | ICD-10-CM | POA: Diagnosis not present

## 2023-11-28 DIAGNOSIS — M79662 Pain in left lower leg: Secondary | ICD-10-CM | POA: Diagnosis not present

## 2023-11-28 DIAGNOSIS — G894 Chronic pain syndrome: Secondary | ICD-10-CM | POA: Diagnosis not present

## 2023-11-28 DIAGNOSIS — Z79891 Long term (current) use of opiate analgesic: Secondary | ICD-10-CM | POA: Diagnosis not present

## 2023-11-28 DIAGNOSIS — M792 Neuralgia and neuritis, unspecified: Secondary | ICD-10-CM | POA: Diagnosis not present

## 2023-11-28 DIAGNOSIS — G992 Myelopathy in diseases classified elsewhere: Secondary | ICD-10-CM

## 2023-11-28 DIAGNOSIS — M79621 Pain in right upper arm: Secondary | ICD-10-CM | POA: Diagnosis not present

## 2023-12-01 ENCOUNTER — Ambulatory Visit: Admitting: Physician Assistant

## 2023-12-08 ENCOUNTER — Ambulatory Visit
Admission: RE | Admit: 2023-12-08 | Discharge: 2023-12-08 | Disposition: A | Discharge status: Home / Self Care | Attending: Physician Assistant | Admitting: Physician Assistant

## 2023-12-08 ENCOUNTER — Ambulatory Visit
Admission: RE | Admit: 2023-12-08 | Discharge: 2023-12-08 | Disposition: A | Discharge status: Home / Self Care | Source: Ambulatory Visit | Attending: Physician Assistant | Admitting: Physician Assistant

## 2023-12-08 ENCOUNTER — Ambulatory Visit (INDEPENDENT_AMBULATORY_CARE_PROVIDER_SITE_OTHER): Admitting: Physician Assistant

## 2023-12-08 ENCOUNTER — Encounter: Payer: Self-pay | Admitting: Physician Assistant

## 2023-12-08 VITALS — BP 120/76 | Ht 64.0 in | Wt 180.0 lb

## 2023-12-08 DIAGNOSIS — G992 Myelopathy in diseases classified elsewhere: Secondary | ICD-10-CM | POA: Diagnosis not present

## 2023-12-08 DIAGNOSIS — M4804 Spinal stenosis, thoracic region: Secondary | ICD-10-CM | POA: Insufficient documentation

## 2023-12-08 DIAGNOSIS — M549 Dorsalgia, unspecified: Secondary | ICD-10-CM | POA: Diagnosis not present

## 2023-12-08 MED ORDER — TIZANIDINE HCL 4 MG PO TABS: 4.0000 mg | ORAL_TABLET | Freq: Three times a day (TID) | ORAL | 1 refills | Status: DC | PRN

## 2023-12-08 NOTE — Progress Notes (Unsigned)
   DOS: 07/10/2023, thoracic decompression and fusion  HISTORY OF PRESENT ILLNESS: 12/08/2023 [name] 6 months status post thoracic decompression and fusion for myelopathy.  She was doing very well however about a month ago she states that she was lifting a heavy water  case at the grocery store and has had some pain in her back since.  She states that the pain stays midline does not radiate to her ribs or down towards her legs.  Currently she is working with another Careers adviser for potential surgery on her cervical spine while awaiting EMG for this.  She continues to take gabapentin  and oxy for her pain.  No new weakness, numbness or tingling.  No new saddle anesthesia.     PHYSICAL EXAMINATION:   There were no vitals filed for this visit.  General: Patient is well developed, well nourished, calm, collected, and in no apparent distress.  NEUROLOGICAL:  General: In no acute distress.  Awake, alert, oriented to person, place, and time. Pupils equal round and reactive to light.   Strength: Examination is baseline with largely full strength to lower extremities.  Incision is completely closed.  ROS (Neurologic): Negative except as noted above  IMAGING: I reviewed her x-rays from today.  They are not formally read, however they do show that the hardware is in good position without any evidence of acute failure  ASSESSMENT/PLAN:  Sara Dunlap6 months status post thoracic decompression and fusion for myelopathy.  She was doing very well however about a month ago she states that she was lifting a heavy water  case at the grocery store and has had some pain in her back since.  She states that the pain stays midline does not radiate to her ribs or down towards her legs.  Currently she is working with another Careers adviser for potential surgery on her cervical spine while awaiting EMG for this.   Pleasure to see patient again today.  It is unfortunate she is experiencing knee pain.  Will consider  more formal imaging for this in the future if it continues.  Advised her to continue care with the neurosurgeon that she has been seeing for her neck.  At this time we did discuss spinal cord stimulator, but she would like to hold off at this time until she has full workup complete for her neck with other team.  Red flag symptoms reviewed.  Questions answered.  Encourage patient to reach out to us  for questions or concerns in the future.   Ludwig Safer, [name] Department of Neurosurgery

## 2023-12-12 ENCOUNTER — Other Ambulatory Visit: Payer: Self-pay | Admitting: Cardiology

## 2023-12-12 DIAGNOSIS — E785 Hyperlipidemia, unspecified: Secondary | ICD-10-CM

## 2023-12-17 ENCOUNTER — Other Ambulatory Visit: Payer: Self-pay | Admitting: Pulmonary Disease

## 2023-12-17 ENCOUNTER — Telehealth: Admitting: *Deleted

## 2023-12-17 ENCOUNTER — Other Ambulatory Visit (HOSPITAL_COMMUNITY): Payer: Self-pay

## 2023-12-17 ENCOUNTER — Telehealth: Payer: Self-pay

## 2023-12-17 DIAGNOSIS — S46211D Strain of muscle, fascia and tendon of other parts of biceps, right arm, subsequent encounter: Secondary | ICD-10-CM | POA: Diagnosis not present

## 2023-12-17 DIAGNOSIS — R0602 Shortness of breath: Secondary | ICD-10-CM

## 2023-12-17 DIAGNOSIS — M7581 Other shoulder lesions, right shoulder: Secondary | ICD-10-CM | POA: Diagnosis not present

## 2023-12-17 DIAGNOSIS — Z9889 Other specified postprocedural states: Secondary | ICD-10-CM | POA: Diagnosis not present

## 2023-12-17 NOTE — Telephone Encounter (Signed)
 Pharmacy Patient Advocate Encounter   Received notification from CoverMyMeds that prior authorization for Qulipta  60MG  tablets is required/requested.   Insurance verification completed.   The patient is insured through [facility] Medicare Part D .   Per test claim: PA required; PA submitted to above mentioned insurance via CoverMyMeds Key/confirmation #/EOC [id number] Status is pending

## 2023-12-18 ENCOUNTER — Ambulatory Visit: Admitting: Neurology

## 2023-12-18 DIAGNOSIS — R202 Paresthesia of skin: Secondary | ICD-10-CM | POA: Diagnosis not present

## 2023-12-18 DIAGNOSIS — G5601 Carpal tunnel syndrome, right upper limb: Secondary | ICD-10-CM

## 2023-12-18 DIAGNOSIS — M5412 Radiculopathy, cervical region: Secondary | ICD-10-CM

## 2023-12-18 NOTE — Telephone Encounter (Signed)
 Pharmacy Patient Advocate Encounter  Received notification from [employer] Medicare Part D that Prior Authorization for Qulipta  60MG  tablets has been APPROVED from 12-17-2023 to 06-23-2024   PA #/Case ID/Reference #: [id number]

## 2023-12-18 NOTE — Procedures (Signed)
 [facility]  [address] [place], [address]  [place], [place] [address] Tel: [phone] Fax: [phone] Test Date:  12/18/2023  Patient: Sara Munoz DOB: [birth year] Physician: Tonita Blanch, DO  Sex: Female Height: 5' 4 Ref Phys: Arley Helling, MD  ID#: [id number]   Technician:    History: This is a 58 year old female s/p ACFD C5- C7 referred for evaluation of bilateral hand paresthesias.  NCV & EMG Findings: Extensive electrodiagnostic testing of the right upper extremity and additional studies of the left shows:  Right median sensory response shows prolonged latency (3.8 ms).  Left median, left mixed palmar, and bilateral ulnar sensory responses are within normal limits. Right median motor response shows prolonged latency (4.3 ms).  Left median and bilateral ulnar motor responses are within normal limits.  Chronic motor axonal loss changes are seen affecting the right pronator teres and triceps muscles, without accompanying active denervation.   Impression: Right median neuropathy at or distal to the wrist, consistent with a clinical diagnosis of carpal tunnel syndrome.  Overall, these findings are moderate in degree electrically. Chronic C7 radiculopathy affecting the right upper extremity, mild.   ___________________________ Tonita Blanch, DO    Nerve Conduction Studies   Stim Site NR Peak (ms) Norm Peak (ms) O-P Amp (V) Norm O-P Amp  Left Median Anti Sensory (2nd Digit)  32 C  Wrist    3.3 <3.6 27.6 >15  Right Median Anti Sensory (2nd Digit)  32 C  Wrist    *3.8 <3.6 15.9 >15  Left Ulnar Anti Sensory (5th Digit)  32 C  Wrist    2.8 <3.1 27.5 >10  Right Ulnar Anti Sensory (5th Digit)  32 C  Wrist    2.7 <3.1 30.0 >10     Stim Site NR Onset (ms) Norm Onset (ms) O-P Amp (mV) Norm O-P Amp Site1 Site2 Delta-0 (ms) Dist (cm) Vel (m/s) Norm Vel (m/s)  Left Median Motor (Abd Poll Brev)  32 C  Wrist    3.9 <4.0 9.9 >6 Elbow Wrist 4.9 29.0 59 >50  Elbow    8.8   9.8         Right Median Motor (Abd Poll Brev)  32 C  Wrist    *4.3 <4.0 9.0 >6 Elbow Wrist 5.2 28.0 54 >50  Elbow    9.5  8.3         Left Ulnar Motor (Abd Dig Minimi)  32 C  Wrist    2.8 <3.1 11.6 >7 B Elbow Wrist 4.0 22.0 55 >50  B Elbow    6.8  10.4  A Elbow B Elbow 1.9 10.0 53 >50  A Elbow    8.7  9.7         Right Ulnar Motor (Abd Dig Minimi)  32 C  Wrist    2.3 <3.1 9.9 >7 B Elbow Wrist 3.5 22.0 63 >50  B Elbow    5.8  9.9  A Elbow B Elbow 1.5 9.0 60 >50  A Elbow    7.3  9.4            Stim Site NR Peak (ms) Norm Peak (ms) P-T Amp (V) Site1 Site2 Delta-P (ms) Norm Delta (ms)  Left Median/Ulnar Palm Comparison (Wrist - 8cm)  32 C  Median Palm    1.9 <2.2 65.1 Median Palm Ulnar Palm 0.3   Ulnar Palm    1.6 <2.2 15.0       Electromyography   Side Muscle Ins.Act Fibs Fasc  Recrt Amp Dur Poly Activation Comment  Right 1stDorInt Nml Nml Nml Nml Nml Nml Nml Nml N/A  Right Abd Poll Brev Nml Nml Nml Nml Nml Nml Nml Nml N/A  Right PronatorTeres Nml Nml Nml *1- *1+ *1+ *1+ Nml N/A  Right Biceps Nml Nml Nml Nml Nml Nml Nml Nml N/A  Right Triceps Nml Nml Nml *1- *1+ *1+ *1+ Nml N/A  Right Deltoid Nml Nml Nml Nml Nml Nml Nml Nml N/A  Left 1stDorInt Nml Nml Nml Nml Nml Nml Nml Nml N/A  Left PronatorTeres Nml Nml Nml Nml Nml Nml Nml Nml N/A  Left Biceps Nml Nml Nml Nml Nml Nml Nml Nml N/A  Left Triceps Nml Nml Nml Nml Nml Nml Nml Nml N/A  Left Deltoid Nml Nml Nml Nml Nml Nml Nml Nml N/A      Waveforms:

## 2023-12-24 ENCOUNTER — Telehealth: Admitting: Family Medicine

## 2023-12-24 ENCOUNTER — Encounter: Payer: Self-pay | Admitting: Family Medicine

## 2023-12-24 DIAGNOSIS — Z79891 Long term (current) use of opiate analgesic: Secondary | ICD-10-CM | POA: Diagnosis not present

## 2023-12-24 DIAGNOSIS — M542 Cervicalgia: Secondary | ICD-10-CM | POA: Diagnosis not present

## 2023-12-24 DIAGNOSIS — M545 Low back pain, unspecified: Secondary | ICD-10-CM | POA: Diagnosis not present

## 2023-12-24 DIAGNOSIS — M79622 Pain in left upper arm: Secondary | ICD-10-CM | POA: Diagnosis not present

## 2023-12-24 DIAGNOSIS — F411 Generalized anxiety disorder: Secondary | ICD-10-CM | POA: Diagnosis not present

## 2023-12-24 DIAGNOSIS — F5101 Primary insomnia: Secondary | ICD-10-CM | POA: Diagnosis not present

## 2023-12-24 DIAGNOSIS — E782 Mixed hyperlipidemia: Secondary | ICD-10-CM | POA: Diagnosis not present

## 2023-12-24 DIAGNOSIS — G894 Chronic pain syndrome: Secondary | ICD-10-CM | POA: Diagnosis not present

## 2023-12-24 DIAGNOSIS — M25511 Pain in right shoulder: Secondary | ICD-10-CM | POA: Diagnosis not present

## 2023-12-24 MED ORDER — CLONAZEPAM 2 MG PO TABS: 2.0000 mg | ORAL_TABLET | Freq: Two times a day (BID) | ORAL | 0 refills | Status: DC

## 2023-12-24 NOTE — Assessment & Plan Note (Signed)
 Chronic, well controlled  Insomnia managed with Ambien  5 mg, effective for sleep. - Continue Ambien  5 mg.

## 2023-12-24 NOTE — Assessment & Plan Note (Signed)
 Chronic, minimal improvement due to continued stressors Anxiety managed with Klonopin , increased to 1 mg twice daily two months ago, with partial improvement. Anxiety attributed to personal stressors. Discussed increasing Klonopin  to 2 mg twice daily; she agreed to try this dosage for a month. Open to therapy if needed. Changes in living situation may reduce anxiety and medication need. - Increase Klonopin  to 2 mg twice daily. - Follow-up in one month to reassess anxiety and medication efficacy. - Consider therapist referral if anxiety persists.

## 2023-12-24 NOTE — Assessment & Plan Note (Signed)
 Cholesterol levels require monitoring. - Check cholesterol levels at next appointment. - continue atorvastatin  40mg  daily

## 2023-12-24 NOTE — Progress Notes (Signed)
 MyChart Video Visit    Virtual Visit via Video Note   This format is felt to be most appropriate for this patient at this time. Physical exam was limited by quality of the video and audio technology used for the visit.   Patient location: Patient's home address   Provider location: [facility]  [address]  [place], [place] [address]   I discussed the limitations of evaluation and management by telemedicine and the availability of in person appointments. The patient expressed understanding and agreed to proceed.  Patient: Sara Munoz   DOB: Sep 13, 1965   58 y.o. Female  MRN: [id number] Visit Date: 12/24/2023  Today's healthcare provider: Rockie Agent, MD   No chief complaint on file.  Subjective    HPI   Discussed the use of AI scribe software for clinical note transcription with the patient, who gave verbal consent to proceed.  History of Present Illness [name] is a 58 year old female who presents for follow-up regarding her anxiety management.  Her anxiety has shown some improvement since her Klonopin  dosage was increased to 1 mg twice a day approximately two months ago. While the medication has helped, her anxiety is not yet at the desired level of control. She attributes some of her ongoing stress to personal circumstances rather than the medication itself. She confirms adherence to the prescribed dosage, taking it twice daily.  She is currently experiencing stress which affects her mood, causing fluctuations. She describes her mood as generally decent but acknowledges that stress can lead to 'bad moods'.  She is also taking Ambien  5 mg for insomnia, which she reports is effective in helping her sleep.      Past Medical History:  Diagnosis Date   Anemia    Anxiety    Aortic atherosclerosis (HCC)    Asthma    Atypical chest pain 08/02/2015   Carotid artery disease (HCC)    Chronic back pain    Chronic nausea     Chronic, continuous use of opioids 02/27/2016   a.) has naloxone  Rx available   COPD (chronic obstructive pulmonary disease) (HCC)    Coronary artery disease    a.) LHC 2011: no sig CAD (report unavailable); b.) LHC 07/07/2014: 10-20% mLM, 25% LAD, 30-40% LCx, 20-30% RCA -- med mgmt; c.) LHC 02/22/2016: 25% LM, 25% pLCx --> med mgmt; d.) MPI 07/10/2017: no ischemia; e.) MPI 09/01/2019: no ischemia; f.) cCTA 10/28/2022: Ca score 1092 (99th percentile for age/sex/race match control)   Current smoker    DDD (degenerative disc disease), cervical    a.) s/p ACDF C5-C7 and PSIF C5-T1   DDD (degenerative disc disease), lumbar 05/23/2020   a.) s/p L5-S1 PLIF   GERD (gastroesophageal reflux disease)    Hepatic steatosis    Hepatitis C virus infection cured after antiviral drug therapy    a.) s/p Tx with standard glecaprevir-pibrentasvir course   Hypercholesteremia    Hyperkalemia    Hyperlipidemia    Hypertension    Hypokalemia    Hypomagnesemia 01/04/2014   Incomplete tear of left rotator cuff    Insomnia    a.) on hypnotic PRN (zolpidem )   MI (myocardial infarction) (HCC) 2012   Migraines    MSSA (methicillin susceptible Staphylococcus aureus) septicemia (HCC) 03/03/2014   a.) TEE (-) for valvular vegitation   Nerve pain    per patient, in the lower back   Osteoarthritis    Pneumonia    PTSD (post-traumatic stress  disorder)    PVD (peripheral vascular disease) (HCC)    Tendinitis of left rotator cuff    TIA (transient ischemic attack) 2014    Medications: Outpatient Medications Prior to Visit  Medication Sig   albuterol  (VENTOLIN  HFA) 108 (90 Base) MCG/ACT inhaler Inhale 2 puffs into the lungs every 6 (six) hours as needed for wheezing or shortness of breath.   amLODipine  (NORVASC ) 5 MG tablet TAKE 1 TABLET BY MOUTH EVERYDAY AT BEDTIME   aspirin  EC 81 MG tablet Take 81 mg by mouth in the morning. Swallow whole.   Atogepant  (QULIPTA ) 60 MG TABS Take 1 tablet (60 mg total) by  mouth daily. (Patient not taking: Reported on 11/27/2023)   atorvastatin  (LIPITOR) 40 MG tablet TAKE 1 TABLET BY MOUTH EVERY DAY IN THE EVENING   budeson-glycopyrrolate -formoterol  (BREZTRI  AEROSPHERE) 160-9-4.8 MCG/ACT AERO inhaler Inhale 2 puffs into the lungs in the morning and at bedtime.   docusate sodium  (COLACE) 100 MG capsule Take 100 mg by mouth in the morning.   fenofibrate  (TRICOR ) 48 MG tablet Take 1 tablet (48 mg total) by mouth daily.   gabapentin  (NEURONTIN ) 600 MG tablet Take 600 mg by mouth 4 (four) times daily.   ipratropium-albuterol  (DUONEB) 0.5-2.5 (3) MG/3ML SOLN INHALE 3 ML BY NEBULIZER EVERY 6 HOURS AS NEEDED   isosorbide  mononitrate (IMDUR ) 30 MG 24 hr tablet Take 1 tablet (30 mg total) by mouth daily.   LINZESS  290 MCG CAPS capsule Take 290 mcg by mouth daily as needed (constipation.).   lisinopril  (ZESTRIL ) 20 MG tablet Take 1 tablet (20 mg total) by mouth daily.   LOFENA 25 MG TABS Take by mouth.   metoprolol  tartrate (LOPRESSOR ) 25 MG tablet TAKE ONE-HALF TABLET BY MOUTH  TWICE DAILY   mometasone  (ELOCON ) 0.1 % cream Apply 1 Application topically daily as needed (hand rash).   naloxone  (NARCAN ) 4 MG/0.1ML LIQD nasal spray kit Place 1 spray into the nose as needed (opioid overdose).   omeprazole  (PRILOSEC) 40 MG capsule Take 1 capsule (40 mg total) by mouth 2 (two) times daily.   ondansetron  (ZOFRAN ) 8 MG tablet Take 1 tablet (8 mg total) by mouth every 8 (eight) hours as needed for nausea or vomiting.   oxyCODONE  (ROXICODONE ) 15 MG immediate release tablet Take 15 mg by mouth every 4 (four) hours.   Tiotropium Bromide  Monohydrate (SPIRIVA  RESPIMAT) 2.5 MCG/ACT AERS Take 1 puff by mouth in the morning and at bedtime. (Patient taking differently: Take 1 puff by mouth in the morning.)   tiZANidine  (ZANAFLEX ) 4 MG tablet Take 1 tablet (4 mg total) by mouth every 8 (eight) hours as needed.   umeclidinium bromide  (INCRUSE ELLIPTA ) 62.5 MCG/ACT AEPB Inhale 1 puff into the lungs  in the morning.   VENTOLIN  HFA 108 (90 Base) MCG/ACT inhaler USE 1 TO 2 INHALATIONS BY MOUTH  INTO THE LUNGS EVERY 6 HOURS AS  NEEDED FOR SHORTNESS OF BREATH  OR WHEEZING   zolpidem  (AMBIEN ) 5 MG tablet Take 1 tablet (5 mg total) by mouth at bedtime as needed for sleep.   [DISCONTINUED] clonazePAM  (KLONOPIN ) 1 MG tablet Take 1 tablet (1 mg total) by mouth 2 (two) times daily as needed for anxiety.   No facility-administered medications prior to visit.    Review of Systems  Last metabolic panel Lab Results  Component Value Date   GLUCOSE 99 10/02/2023   NA 137 10/02/2023   K 5.9 (H) 10/02/2023   CL 97 10/02/2023   CO2 23 10/02/2023  BUN 14 10/02/2023   CREATININE 0.73 10/02/2023   EGFR 97 10/02/2023   CALCIUM  10.0 10/02/2023   PHOS 2.7 09/16/2023   PROT 7.1 09/21/2023   ALBUMIN  3.8 09/21/2023   LABGLOB 2.5 01/06/2023   AGRATIO 1.6 04/03/2022   BILITOT <0.1 09/21/2023   ALKPHOS 68 09/21/2023   AST 18 09/21/2023   ALT 12 09/21/2023   ANIONGAP 10 09/21/2023        Objective    LMP 12/16/2017 (Approximate)   BP Readings from Last 3 Encounters:  12/08/23 120/76  11/27/23 133/78  11/19/23 (!) 162/92   Wt Readings from Last 3 Encounters:  12/08/23 180 lb (81.6 kg)  11/27/23 180 lb 9.6 oz (81.9 kg)  11/19/23 177 lb (80.3 kg)        Physical Exam   Physical Exam GENERAL/Psych: No acute distress, not anxious, not tearful. PULM: normal respiratory effort     Assessment & Plan     Problem List Items Addressed This Visit       Other   Primary insomnia   Chronic, well controlled  Insomnia managed with Ambien  5 mg, effective for sleep. - Continue Ambien  5 mg.      GAD (generalized anxiety disorder) - Primary   Chronic, minimal improvement due to continued stressors Anxiety managed with Klonopin , increased to 1 mg twice daily two months ago, with partial improvement. Anxiety attributed to personal stressors. Discussed increasing Klonopin  to 2 mg twice  daily; she agreed to try this dosage for a month. Open to therapy if needed. Changes in living situation may reduce anxiety and medication need. - Increase Klonopin  to 2 mg twice daily. - Follow-up in one month to reassess anxiety and medication efficacy. - Consider therapist referral if anxiety persists.      Relevant Medications   clonazePAM  (KLONOPIN ) 2 MG tablet    Assessment & Plan      No follow-ups on file.     I discussed the assessment and treatment plan with the patient. The patient was provided an opportunity to ask questions and all were answered. The patient agreed with the plan and demonstrated an understanding of the instructions.   The patient was advised to call back or seek an in-person evaluation if the symptoms worsen or if the condition fails to improve as anticipated.  I provided 23 minutes of non-face-to-face time during this encounter.   Rockie Agent, MD [facility] [phone] (phone) [phone] (fax)  [facility]

## 2023-12-28 ENCOUNTER — Other Ambulatory Visit: Payer: Self-pay | Admitting: Family Medicine

## 2023-12-28 DIAGNOSIS — I10 Essential (primary) hypertension: Secondary | ICD-10-CM

## 2023-12-31 ENCOUNTER — Other Ambulatory Visit: Payer: Self-pay

## 2023-12-31 NOTE — Patient Outreach (Signed)
 Complex Care Management   Visit Note  12/31/2023  Name:  Sara Munoz MRN: [id number] DOB: 07/20/1965  Situation: Referral received for Complex Care Management related to COPD and Anxiety I obtained verbal consent from Patient.  Visit completed with Patient  on the phone  Background:   Past Medical History:  Diagnosis Date   Anemia    Anxiety    Aortic atherosclerosis (HCC)    Asthma    Atypical chest pain 08/02/2015   Carotid artery disease (HCC)    Chronic back pain    Chronic nausea    Chronic, continuous use of opioids 02/27/2016   a.) has naloxone  Rx available   COPD (chronic obstructive pulmonary disease) (HCC)    Coronary artery disease    a.) LHC 2011: no sig CAD (report unavailable); b.) LHC 07/07/2014: 10-20% mLM, 25% LAD, 30-40% LCx, 20-30% RCA -- med mgmt; c.) LHC 02/22/2016: 25% LM, 25% pLCx --> med mgmt; d.) MPI 07/10/2017: no ischemia; e.) MPI 09/01/2019: no ischemia; f.) cCTA 10/28/2022: Ca score 1092 (99th percentile for age/sex/race match control)   Current smoker    DDD (degenerative disc disease), cervical    a.) s/p ACDF C5-C7 and PSIF C5-T1   DDD (degenerative disc disease), lumbar 05/23/2020   a.) s/p L5-S1 PLIF   GERD (gastroesophageal reflux disease)    Hepatic steatosis    Hepatitis C virus infection cured after antiviral drug therapy    a.) s/p Tx with standard glecaprevir-pibrentasvir course   Hypercholesteremia    Hyperkalemia    Hyperlipidemia    Hypertension    Hypokalemia    Hypomagnesemia 01/04/2014   Incomplete tear of left rotator cuff    Insomnia    a.) on hypnotic PRN (zolpidem )   MI (myocardial infarction) (HCC) 2012   Migraines    MSSA (methicillin susceptible Staphylococcus aureus) septicemia (HCC) 03/03/2014   a.) TEE (-) for valvular vegitation   Nerve pain    per patient, in the lower back   Osteoarthritis    Pneumonia    PTSD (post-traumatic stress disorder)    PVD (peripheral vascular disease) (HCC)    Tendinitis of  left rotator cuff    TIA (transient ischemic attack) 2014    Assessment: Patient Reported Symptoms:  Cognitive Cognitive Status: Alert and oriented to person, place, and time, Normal speech and language skills, Insightful and able to interpret abstract concepts   Health Maintenance Behaviors: Annual physical exam, Spiritual practice(s) Healing Pattern: Average Health Facilitated by: Pain control, Rest, Stress management  Neurological Neurological Review of Symptoms: Headaches, Numbness (chronic migraines - always has some level of headache, thinks related to stress) Neurological Management Strategies: Adequate rest, Medication therapy, Routine screening Neurological Comment: Numbness and tingling in head, neck, arms, hands legs and back - not new, related to spinal issues and carpel tunnel  HEENT HEENT Symptoms Reported: Tinnitus, Other: (needs cataract surgery, ears ring at times) HEENT Management Strategies: Routine screening HEENT Self-Management Outcome: 3 (uncertain)    Cardiovascular Does patient have uncontrolled Hypertension?: No Weight: 177 lb (80.3 kg) (patient reported) Cardiovascular Comment: reports has CP occasionally, relieved with rest and controled by medication, aware of red flags for ED  Respiratory Respiratory Symptoms Reported: Shortness of breath, Wheezing, Dry cough Additional Respiratory Details: Frequently with SOB, worse with activity and exposure to temperature changes, also smokes Respiratory Management Strategies: Adequate rest, Medication therapy, Routine screening Respiratory Self-Management Outcome: 3 (uncertain)  Endocrine Endocrine Symptoms Reported: No symptoms reported Is patient diabetic?: No  Gastrointestinal Gastrointestinal Symptoms Reported: Nausea, Reflux/heartburn Gastrointestinal Management Strategies: Medication therapy, Diet modification    Genitourinary Genitourinary Symptoms Reported: No symptoms reported Additional Genitourinary  Details: has had frequency since she was a child    Integumentary Integumentary Symptoms Reported: No symptoms reported    Musculoskeletal Musculoskelatal Symptoms Reviewed: Back pain, Difficulty walking, Joint pain, Limited mobility, Muscle pain, Unsteady gait, Weakness Additional Musculoskeletal Details: Numbness and tongling as noted prior Musculoskeletal Management Strategies: Adequate rest, Medication therapy, Routine screening Musculoskeletal Self-Management Outcome: 3 (uncertain) Musculoskeletal Comment: needs additional surgery in neck Falls in the past year?: Yes Number of falls in past year: 1 or less Was there an injury with Fall?: No Fall Risk Category Calculator: 1 Patient Fall Risk Level: Low Fall Risk Patient at Risk for Falls Due to: History of fall(s), Impaired balance/gait, Impaired mobility, Impaired vision, Medication side effect (patient takes multiple medications that can impair balance - opiods, benzos) Fall risk Follow up: Falls evaluation completed, Falls prevention discussed (carries cell phone in case of falls)  Psychosocial Psychosocial Symptoms Reported: Anxiety - if selected complete GAD, Depression - if selected complete PHQ 2-9, Difficulty concentrating, Report of significant loss, deaths, abandonment, traumatic incidents, Irritability Additional Psychological Details: PTSD - has had psychiatrist and counselin gin past - will discuss resuming with PCP at next appointment Behavioral Management Strategies: Adequate rest, Medication therapy, Coping strategies Behavioral Health Self-Management Outcome: 3 (uncertain) Major Change/Loss/Stressor/Fears (CP): Medical condition, self, Traumatic event Techniques to Cope with Loss/Stress/Change: Diversional activities, Medication Quality of Family Relationships: stressful Do you feel physically threatened by others?: No      12/31/2023    2:42 PM  Depression screen PHQ 2/9  Decreased Interest 2  Down, Depressed,  Hopeless 1  PHQ - 2 Score 3  Altered sleeping 2  Tired, decreased energy 3  Change in appetite 1  Feeling bad or failure about yourself  0  Trouble concentrating 2  Moving slowly or fidgety/restless 1  Suicidal thoughts 0  PHQ-9 Score 12  Difficult doing work/chores Somewhat difficult    Vitals:   12/31/23 1408  BP: 117/72    Medications Reviewed Today     Reviewed by Devra Lands, RN (Registered Nurse) on 12/31/23 at 1418  Med List Status: <None>   Medication Order Taking? Sig Documenting Provider Last Dose Status Informant  albuterol  (VENTOLIN  HFA) 108 (90 Base) MCG/ACT inhaler [id number] Yes Inhale 2 puffs into the lungs every 6 (six) hours as needed for wheezing or shortness of breath. Assaker, Darrin, MD  Active   amLODipine  (NORVASC ) 5 MG tablet [id number] Yes TAKE 1 TABLET BY MOUTH EVERYDAY AT BEDTIME [name], [name], MD  Active   aspirin  EC 81 MG tablet [id number] Yes Take 81 mg by mouth in the morning. Swallow whole. [provider]  Active Self  Atogepant  (QULIPTA ) 60 MG TABS [id number] Yes Take 1 tablet (60 mg total) by mouth daily. Skeet Juliene SAUNDERS, DO  Active            Med Note CENA KEELS D   Thu Nov 27, 2023  1:41 PM) Haven't started yet. Awaiting prior authorization.  atorvastatin  (LIPITOR) 40 MG tablet [id number] Yes TAKE 1 TABLET BY MOUTH EVERY DAY IN THE KARNA Darliss Rogue, MD  Active   budeson-glycopyrrolate -formoterol  (BREZTRI  AEROSPHERE) 160-9-4.8 MCG/ACT AERO inhaler [id number] Yes Inhale 2 puffs into the lungs in the morning and at bedtime. Malka Darrin, MD  Active   clonazePAM  (KLONOPIN ) 2 MG tablet [id number] Yes Take 1 tablet (2 mg total) by  mouth 2 (two) times daily. [name], [name], MD  Active   docusate sodium  (COLACE) 100 MG capsule [id number] Yes Take 100 mg by mouth in the morning. [provider]  Active Self  fenofibrate  (TRICOR ) 48 MG tablet [id number] Yes Take 1 tablet (48 mg total) by mouth  daily. [name], [name], MD  Active   gabapentin  (NEURONTIN ) 600 MG tablet [id number] Yes Take 600 mg by mouth 4 (four) times daily. [provider]  Active Self  ipratropium-albuterol  (DUONEB) 0.5-2.5 (3) MG/3ML SOLN [id number] Yes INHALE 3 ML BY NEBULIZER EVERY 6 HOURS AS NEEDED Assaker, Darrin, MD  Active   isosorbide  mononitrate (IMDUR ) 30 MG 24 hr tablet [id number] Yes Take 1 tablet (30 mg total) by mouth daily. Gerard Frederick, NP  Active   LINZESS  290 MCG CAPS capsule [id number]  Take 290 mcg by mouth daily as needed (constipation.). [provider]  Active Self  lisinopril  (ZESTRIL ) 20 MG tablet [id number] Yes Take 1 tablet (20 mg total) by mouth daily. [name], Rockie, MD  Active   LOFENA 25 MG TABS [id number]  Take by mouth. [provider]  Active   metoprolol  tartrate (LOPRESSOR ) 25 MG tablet [id number] Yes TAKE ONE-HALF TABLET BY MOUTH  TWICE DAILY [name], [name], MD  Active Self  mometasone  (ELOCON ) 0.1 % cream [id number] Yes Apply 1 Application topically daily as needed (hand rash). Poggi, Norleen PARAS, MD  Active Self  naloxone  (NARCAN ) 4 MG/0.1ML LIQD nasal spray kit [id number] Yes Place 1 spray into the nose as needed (opioid overdose). [provider]  Active Self           Med Note SOILA LYLE JAYSON [place] May 03, 2021 11:23 AM)    omeprazole  (PRILOSEC) 40 MG capsule [id number] Yes Take 1 capsule (40 mg total) by mouth 2 (two) times daily. Therisa Bi, MD  Active Self  ondansetron  (ZOFRAN ) 8 MG tablet [id number] Yes Take 1 tablet (8 mg total) by mouth every 8 (eight) hours as needed for nausea or vomiting. [name], [name], MD  Active Self  oxyCODONE  (ROXICODONE ) 15 MG immediate release tablet [id number] Yes Take 15 mg by mouth every 4 (four) hours. [provider]  Active Self  Tiotropium Bromide  Monohydrate (SPIRIVA  RESPIMAT) 2.5 MCG/ACT AERS [id number] Yes Take 1 puff by mouth in the morning and at bedtime.   Patient taking differently: Take 1 puff by mouth in the morning.   Emilio Kelly DASEN, FNP  Active Self  tiZANidine  (ZANAFLEX ) 4 MG tablet [id number] Yes Take 1 tablet (4 mg total) by mouth every 8 (eight) hours as needed. Ulis LYLE, [name]  Active   umeclidinium bromide  (INCRUSE ELLIPTA ) 62.5 MCG/ACT AEPB [id number] Yes Inhale 1 puff into the lungs in the morning. [provider]  Active Self  VENTOLIN  HFA 108 (90 Base) MCG/ACT inhaler [id number] Yes USE 1 TO 2 INHALATIONS BY MOUTH  INTO THE LUNGS EVERY 6 HOURS AS  NEEDED FOR SHORTNESS OF BREATH  OR WHEEZING [name], [name], MD  Active Self  zolpidem  (AMBIEN ) 5 MG tablet [id number] Yes Take 1 tablet (5 mg total) by mouth at bedtime as needed for sleep. [name], Rockie, MD  Active Self            Recommendation:   PCP Follow-up Continue Current Plan of Care  Follow Up Plan:   Telephone follow-up 2 Doxie Augenstein  Nestora Duos, MSN, RN [facility]  [facility], [facility] RN Care Manager Direct Dial: [phone] Fax: [phone]

## 2023-12-31 NOTE — Patient Instructions (Addendum)
 Visit Information  Thank you for taking time to visit with me today. Please don't hesitate to contact me if I can be of assistance to you before our next scheduled appointment.  Our next appointment is by telephone on 01/13/2024 at 10:30 am Please call the care guide team at [phone] if you need to cancel or reschedule your appointment.   Site to Find Dentists Taking Medicaid in your area: [email or web address]  Please call the number on the back of your insurance card to find eye doctor as discussed.   Following is a copy of your care plan:   Goals Addressed             This Visit's Progress    [facility] RN Care Plan - Anxiety       Problems:  Chronic Disease Management support and education needs related to Anxiety  Goal: Over the next 90 days the Patient will attend all scheduled medical appointments: PCP as evidenced by no missed appointments        continue to work with RN Care Manager and/or Social Worker to address care management and care coordination needs related to Anxiety as evidenced by adherence to care management team scheduled appointments     take all medications exactly as prescribed and will call provider for medication related questions as evidenced by Verbalizing compliance with all medications during medications reconciliation    verbalize basic understanding of Anxiety disease process and self health management plan as evidenced by taking medications as prescribed, lifestyle modifications to reduce anxiety including engaging in preferred activities, reducing smoking, discussing return to therapy and psychiatrist with PCP at 02/10/24 appointment and considering talking to LCSW with CM program as needed  Interventions:   Evaluation of current treatment plan related to Anxiety, Limited social support, Mental Health Concerns , Family and relationship dysfunction, and Social Isolation self-management and patient's adherence to  plan as established by provider. Discussed plans with patient for ongoing care management follow up and provided patient with direct contact information for care management team Provided education to patient and/or caregiver about advanced directives Provided education to patient re: Falls, anxiety, advanced directives and COPD Reviewed medications with patient and discussed increased risk of falls/fall precautions and sent education through MyChart Provided patient with written educational materials related to Falls, COPD, Anxiety (reports anxiety impacting SOB) Advised patient to discuss restarting counseling and psychiatrist with provider Screening for signs and symptoms of depression related to chronic disease state  Assessed social determinant of health barriers  Patient Self-Care Activities:  Attend all scheduled provider appointments Call pharmacy for medication refills 3-7 days in advance of running out of medications Call provider office for new concerns or questions  Take medications as prescribed    Plan:  Telephone follow up appointment with care management team member scheduled for:  01/13/2024 at 10:30 am          [facility] RN Care Plan - COPD       Problems:  Chronic Disease Management support and education needs related to COPD  Goal: Over the next 90 days the Patient will attend all scheduled medical appointments: PCP as evidenced by no missed appointments        continue to work with RN Care Manager and/or Social Worker to address care management and care coordination needs related to COPD as evidenced by adherence to care management team scheduled appointments     take all medications exactly as prescribed and will call provider for medication related questions as evidenced  by Verbalizing compliance with all medications during med reconciliation    verbalize basic understanding of COPD disease process and self health management plan as evidenced by Taking all medications  as prescribed, following COPD Action Plan and reporting worsening symptoms to PCP and recognizing red flags for ED, lifestyle modifications including limiting exposure to triggers, reducing and move towards quitting cigarettes  Interventions:   COPD Interventions: Advised patient to track and manage COPD triggers Advised patient to self assesses COPD action plan zone and make appointment with provider if in the yellow zone for 48 hours without improvement Assessed social determinant of health barriers Discussed the importance of adequate rest and management of fatigue with COPD Provided education about and advised patient to utilize infection prevention strategies to reduce risk of respiratory infection Provided instruction about proper use of medications used for management of COPD including inhalers Screening for signs and symptoms of depression related to chronic disease state   Patient Self-Care Activities:  Attend all scheduled provider appointments Call pharmacy for medication refills 3-7 days in advance of running out of medications Call provider office for new concerns or questions  Take medications as prescribed   eliminate symptom triggers at home keep follow-up appointments: PCP 02/10/24 don't eat or exercise right before bedtime use devices that will help like a cane, sock-puller or reacher do breathing exercises every day  Plan:  Telephone follow up appointment with care management team member scheduled for:  01/13/2024 at 10:30 am             Please call the Suicide and Crisis Lifeline: 988 call the [place]  National Suicide Prevention Lifeline: 803 566 0958 or TTY: 403-264-2589 TTY (848)180-5794) to talk to a trained counselor call 1-800-273-TALK (toll free, 24 hour hotline) go to [facility] [address], [place] [phone]) call 911 if you are experiencing a Mental Health or Behavioral Health Crisis or need someone to  talk to.  Patient verbalizes understanding of instructions and care plan provided today and agrees to view in MyChart. Active MyChart status and patient understanding of how to access instructions and care plan via MyChart confirmed with patient.     Nestora Duos, MSN, RN [place]  [facility], [facility] RN Care Manager Direct Dial: [phone] Fax: [phone]   Fall Prevention in the Home, Adult Falls can cause injuries and can happen to people of all ages. There are many things you can do to make your home safer and to help prevent falls. What actions can I take to prevent falls? General information Use good lighting in all rooms. Make sure to: Replace any light bulbs that burn out. Turn on the lights in dark areas and use night-lights. Keep items that you use often in easy-to-reach places. Lower the shelves around your home if needed. Move furniture so that there are clear paths around it. Do not use throw rugs or other things on the floor that can make you trip. If any of your floors are uneven, fix them. Add color or contrast paint or tape to clearly mark and help you see: Grab bars or handrails. First and last steps of staircases. Where the edge of each step is. If you use a ladder or stepladder: Make sure that it is fully opened. Do not climb a closed ladder. Make sure the sides of the ladder are locked in place. Have someone hold the ladder while you use it. Know where your pets are as you move through your home. What  can I do in the bathroom?     Keep the floor dry. Clean up any water  on the floor right away. Remove soap buildup in the bathtub or shower. Buildup makes bathtubs and showers slippery. Use non-skid mats or decals on the floor of the bathtub or shower. Attach bath mats securely with double-sided, non-slip rug tape. If you need to sit down in the shower, use a non-slip stool. Install grab bars by the toilet and in the bathtub  and shower. Do not use towel bars as grab bars. What can I do in the bedroom? Make sure that you have a light by your bed that is easy to reach. Do not use any sheets or blankets on your bed that hang to the floor. Have a firm chair or bench with side arms that you can use for support when you get dressed. What can I do in the kitchen? Clean up any spills right away. If you need to reach something above you, use a step stool with a grab bar. Keep electrical cords out of the way. Do not use floor polish or wax that makes floors slippery. What can I do with my stairs? Do not leave anything on the stairs. Make sure that you have a light switch at the top and the bottom of the stairs. Make sure that there are handrails on both sides of the stairs. Fix handrails that are broken or loose. Install non-slip stair treads on all your stairs if they do not have carpet. Avoid having throw rugs at the top or bottom of the stairs. Choose a carpet that does not hide the edge of the steps on the stairs. Make sure that the carpet is firmly attached to the stairs. Fix carpet that is loose or worn. What can I do on the outside of my home? Use bright outdoor lighting. Fix the edges of walkways and driveways and fix any cracks. Clear paths of anything that can make you trip, such as tools or rocks. Add color or contrast paint or tape to clearly mark and help you see anything that might make you trip as you walk through a door, such as a raised step or threshold. Trim any bushes or trees on paths to your home. Check to see if handrails are loose or broken and that both sides of all steps have handrails. Install guardrails along the edges of any raised decks and porches. Have leaves, snow, or ice cleared regularly. Use sand, salt, or ice melter on paths if you live where there is ice and snow during the winter. Clean up any spills in your garage right away. This includes grease or oil spills. What other actions  can I take? Review your medicines with your doctor. Some medicines can cause dizziness or changes in blood pressure, which increase your risk of falling. Wear shoes that: Have a low heel. Do not wear high heels. Have rubber bottoms and are closed at the toe. Feel good on your feet and fit well. Use tools that help you move around if needed. These include: Canes. Walkers. Scooters. Crutches. Ask your doctor what else you can do to help prevent falls. This may include seeing a physical therapist to learn to do exercises to move better and get stronger. Where to find more information Centers for Disease Control and Prevention, STEADI: TonerPromos.no [employer] on Aging: BaseRingTones.pl National Institute on Aging: BaseRingTones.pl Contact a doctor if: You are afraid of falling at home. You feel weak, drowsy, or  dizzy at home. You fall at home. Get help right away if you: Lose consciousness or have trouble moving after a fall. Have a fall that causes a head injury. These symptoms may be an emergency. Get help right away. Call 911. Do not wait to see if the symptoms will go away. Do not drive yourself to the hospital. This information is not intended to replace advice given to you by your health care provider. Make sure you discuss any questions you have with your health care provider. Document Revised: 02/11/2022 Document Reviewed: 02/11/2022 Elsevier Patient Education  2024 Elsevier Inc.  COPD Action Plan A COPD action plan is a description of what to do when you have a flare (exacerbation) of chronic obstructive pulmonary disease (COPD). Your action plan is a color-coded plan that lists the symptoms that indicate whether your condition is under control and what actions to take. If you have symptoms in the green zone, it means you are doing well that day. If you have symptoms in the yellow zone, it means you are having a bad day or an exacerbation. If you have symptoms in the red zone, you  need urgent medical care. Follow the plan that you and your health care provider developed. Review your plan with your health care provider at each visit. Red zone Symptoms in this zone mean that you should get medical help right away. They include: Feeling very short of breath, even when you are resting. Not being able to do any activities because of poor breathing. Not being able to sleep because of poor breathing. Fever or shaking chills. Feeling confused or very sleepy. Chest pain. Coughing up blood. If you have any of these symptoms, call emergency services (911 in the [place] or go to the nearest emergency room. Yellow zone Symptoms in this zone mean that your condition may be getting worse. They include: Feeling more short of breath than usual. Having less energy for daily activities than usual. Phlegm or mucus that is thicker than usual. Needing to use your rescue inhaler or nebulizer more often than usual. More ankle swelling than usual. Coughing more than usual. Feeling like you have a chest cold. Trouble sleeping due to COPD symptoms. Decreased appetite. COPD medicines not helping as much as usual. If you experience any yellow symptoms: Keep taking your daily medicines as directed. Use your quick-relief inhaler as told by your health care provider. If you were prescribed steroid medicine to take by mouth (oral medicine), start taking it as told by your health care provider. If you were prescribed an antibiotic medicine, start taking it as told by your health care provider. Do not stop taking the antibiotic even if you start to feel better. Use oxygen  as told by your health care provider. Get more rest. Do your pursed-lip breathing exercises. Do not smoke. Avoid any irritants in the air. If your signs and symptoms do not improve after taking these steps, call your health care provider right away. Green zone Symptoms in this zone mean that you are doing well. They  include: Being able to do your usual activities and exercise. Having the usual amount of coughing, including the same amount of phlegm or mucus. Being able to sleep well. Having a good appetite. Where to find more information: You can find more information about COPD from: American Lung Association, My COPD Action Plan: www.lung.org COPD Foundation: www.copdfoundation.org National Heart, Lung, & Blood Institute: PopSteam.is Follow these instructions at home: Continue taking your daily medicines as told  by your health care provider. Make sure you receive all the immunizations that your health care provider recommends, especially the pneumococcal and influenza vaccines. Wash your hands often with soap and water . Have family members wash their hands too. Regular hand washing can help prevent infections. Follow your usual exercise and diet plan. Avoid irritants in the air, such as smoke. Do not use any products that contain nicotine  or tobacco. These products include cigarettes, chewing tobacco, and vaping devices, such as e-cigarettes. If you need help quitting, ask your health care provider. Summary A COPD action plan tells you what to do when you have a flare (exacerbation) of chronic obstructive pulmonary disease (COPD). Follow each action plan for your symptoms. If you have any symptoms in the red zone, call emergency services (911 in the [place] or go to the nearest emergency room. This information is not intended to replace advice given to you by your health care provider. Make sure you discuss any questions you have with your health care provider. Document Revised: 04/24/2023 Document Reviewed: 04/24/2023 Elsevier Patient Education  2024 [employer].  Advance Directive  Advance directives are legal papers that state your wishes about health care decisions. They let your wishes be known to family, friends, and health care providers if you become unable to speak for yourself.  You  should write these papers out over time rather than all at once. They can be changed and updated at any time. The types of advance directives include: Medical power of attorney (POA). Living wills. Do not resuscitate (DNR) or do not attempt resuscitation (DNAR) orders. What are a health care proxy and medical POA? A health care proxy is also called a health care agent. It's a person you choose to make medical decisions for you when you can't make them for yourself. In most cases, a proxy is a trusted friend or family member. A medical POA is legal paperwork that names your proxy. It may need to be: Signed. Notarized. Dated. Copied. Witnessed. Added to your medical record. You may also want to choose someone to handle your money if you can't do so. This is called a durable POA for finances. It's separate from a medical POA. You may choose your health care proxy or someone else to act as your agent in money matters. If you don't have a proxy, or if the proxy may not be acting in your best interest, a court may choose a guardian to act on your behalf. What is a living will? A living will is legal paperwork that states your wishes about medical care. Providers should keep a copy of it in your medical record. You may want to give a copy to family members or friends. You can also keep a card in your wallet to let loved ones know you have a living will and where they can find it. A living will may be used if: You're very sick with something that will end your life. You become disabled. You can't make decisions or speak for yourself. Your living will should include whether: To use or not use life support equipment. This may include machines to filter your blood or to help you breathe. You want a DNR or DNAR order. This tells providers not to use CPR if your heart or breathing stops. To use or not use tube feeding. You want to be given foods and fluids. You want a type of comfort care called  palliative care. This may be given when the goal for  treatment becomes comfort rather than a cure. You want to donate your organs and tissues. A living will doesn't say what to do with your money and property if you pass away. What is a DNR or DNAR? A DNR or DNAR order is a request not to have CPR. If you don't have one of these orders, a provider will try to help you if your heart stops or you stop breathing.  If you plan to have surgery, talk with your provider about your DNR or DNAR order. What happens if I don't have an advance directive? Each state has its own laws about advance directives. Some states assign family decision makers to act on your behalf if you don't have an advance directive.  Check with your provider, attorney, or state representative about the laws in your state. Where to find more information Each state has its own laws about advance directives. You can look up these laws at: https://rodriguez-phillips.com/ This information is not intended to replace advice given to you by your health care provider. Make sure you discuss any questions you have with your health care provider. Document Revised: 10/28/2022 Document Reviewed: 10/28/2022 Elsevier Patient Education  2024 [employer]. After being diagnosed with anxiety, you may be relieved to know why you have felt or behaved a certain way. You may also feel overwhelmed about the treatment ahead and what it will mean for your life. With care and support, you can manage your anxiety. How to manage lifestyle changes Understanding the difference between stress and anxiety Although stress can play a role in anxiety, it is not the same as anxiety. Stress is your body's reaction to life changes and events, both good and bad. Stress is often caused by something external, such as a deadline, test, or competition. It normally goes away after the event has ended and will last just a few hours. But, stress can be ongoing and can lead to more than just  stress. Anxiety is caused by something internal, such as imagining a terrible outcome or worrying that something will go wrong that will greatly upset you. Anxiety often does not go away even after the event is over, and it can become a long-term (chronic) worry. Lowering stress and anxiety Talk with your health care provider or a counselor to learn more about lowering anxiety and stress. They may suggest tension-reduction techniques, such as: Music. Spend time creating or listening to music that you enjoy and that inspires you. Mindfulness-based meditation. Practice being aware of your normal breaths while not trying to control your breathing. It can be done while sitting or walking. Centering prayer. Focus on a word, phrase, or sacred image that means something to you and brings you peace. Deep breathing. Expand your stomach and inhale slowly through your nose. Hold your breath for 3-5 seconds. Then breathe out slowly, letting your stomach muscles relax. Self-talk. Learn to notice and spot thought patterns that lead to anxiety reactions. Change those patterns to thoughts that feel peaceful. Muscle relaxation. Take time to tense muscles and then relax them. Choose a tension-reduction technique that fits your lifestyle and personality. These techniques take time and practice. Set aside 5-15 minutes a day to do them. Specialized therapists can offer counseling and training in these techniques. The training to help with anxiety may be covered by some insurance plans. Other things you can do to manage stress and anxiety include: Keeping a stress diary. This can help you learn what triggers your reaction and then learn ways  to manage your response. Thinking about how you react to certain situations. You may not be able to control everything, but you can control your response. Making time for activities that help you relax and not feeling guilty about spending your time in this way. Doing visual imagery.  This involves imagining or creating mental pictures to help you relax. Practicing yoga. Through yoga poses, you can lower tension and relax.  Medicines Medicines for anxiety include: Antidepressant medicines. These are usually prescribed for long-term daily control. Anti-anxiety medicines. These may be added in severe cases, especially when panic attacks occur. When used together, medicines, psychotherapy, and tension-reduction techniques may be the most effective treatment. Relationships Relationships can play a big part in helping you recover. Spend more time connecting with trusted friends and family members. Think about going to couples counseling if you have a partner, taking family education classes, or going to family therapy. Therapy can help you and others better understand your anxiety. How to recognize changes in your anxiety Everyone responds differently to treatment for anxiety. Recovery from anxiety happens when symptoms lessen and stop interfering with your daily life at home or work. This may mean that you will start to: Have better concentration and focus. Worry will interfere less in your daily thinking. Sleep better. Be less irritable. Have more energy. Have improved memory. Try to recognize when your condition is getting worse. Contact your provider if your symptoms interfere with home or work and you feel like your condition is not improving. Follow these instructions at home: Activity Exercise. Adults should: Exercise for at least 150 minutes each week. The exercise should increase your heart rate and make you sweat (moderate-intensity exercise). Do strengthening exercises at least twice a week. Get the right amount and quality of sleep. Most adults need 7-9 hours of sleep each night. Lifestyle  Eat a healthy diet that includes plenty of vegetables, fruits, whole grains, low-fat dairy products, and lean protein. Do not eat a lot of foods that are high in fats, added  sugars, or salt (sodium). Make choices that simplify your life. Do not use any products that contain nicotine  or tobacco. These products include cigarettes, chewing tobacco, and vaping devices, such as e-cigarettes. If you need help quitting, ask your provider. Avoid caffeine, alcohol , and certain over-the-counter cold medicines. These may make you feel worse. Ask your pharmacist which medicines to avoid. General instructions Take over-the-counter and prescription medicines only as told by your provider. Keep all follow-up visits. This is to make sure you are managing your anxiety well or if you need more support. Where to find support You can get help and support from: Self-help groups. Online and [employer]. A trusted spiritual leader. Couples counseling. Family education classes. Family therapy. Where to find more information You may find that joining a support group helps you deal with your anxiety. The following sources can help you find counselors or support groups near you: Mental Health America: mentalhealthamerica.net Anxiety and Depression Association of [place] (ADAA): adaa.org [employer] Mental Illness (NAMI): nami.org Contact a health care provider if: You have a hard time staying focused or finishing tasks. You spend many hours a day feeling worried about everyday life. You are very tired because you cannot stop worrying. You start to have headaches or often feel tense. You have chronic nausea or diarrhea. Get help right away if: Your heart feels like it is racing. You have shortness of breath. You have thoughts of hurting yourself or others. Get help  right away if you feel like you may hurt yourself or others, or have thoughts about taking your own life. Go to your nearest emergency room or: Call 911. Call the National Suicide Prevention Lifeline at 302-271-1161 or 988. This is open 24 hours a day. Text the Crisis Text Line at (787)810-7109. This  information is not intended to replace advice given to you by your health care provider. Make sure you discuss any questions you have with your health care provider. Document Revised: 03/19/2022 Document Reviewed: 10/01/2020 Elsevier Patient Education  2024 [employer].

## 2024-01-07 ENCOUNTER — Ambulatory Visit: Payer: Self-pay | Admitting: *Deleted

## 2024-01-07 ENCOUNTER — Other Ambulatory Visit: Payer: Self-pay | Admitting: Physician Assistant

## 2024-01-07 NOTE — Telephone Encounter (Signed)
 Called and was able to inform the pt of the message per Dr R, she stated she understood

## 2024-01-07 NOTE — Telephone Encounter (Signed)
 Copied from CRM [id number]. Topic: Clinical - Red Word Triage >> Jan 07, 2024  8:28 AM Sara Munoz wrote: Red Word that prompted transfer to Nurse Triage: Patient has a productive cough, runny nose, and congestion since last Thursday. Thinks she has bronchitis. States her transportation is limited would like an antibiotic called in. Reason for Disposition  [1] Continuous (nonstop) coughing interferes with work or school AND [2] no improvement using cough treatment per Care Advice  Answer Assessment - Initial Assessment Questions 1. ONSET: When did the cough begin?      Last Thur. I woke up like this.   I get bronchitis every year like this    My nose is running so bad.    2. SEVERITY: How bad is the cough today?      Getting worse 3. SPUTUM: Describe the color of your sputum (e.g., none, dry cough; clear, white, yellow, green)     I'm coughing up mucus.   I don't know what color it is. 4. HEMOPTYSIS: Are you coughing up any blood? If Yes, ask: How much? (e.g., flecks, streaks, tablespoons, etc.)     Not asked 5. DIFFICULTY BREATHING: Are you having difficulty breathing? If Yes, ask: How bad is it? (e.g., mild, moderate, severe)      I'm always short of breath because I have COPD.   My nose is running so bad. 6. FEVER: Do you have a fever? If Yes, ask: What is your temperature, how was it measured, and when did it start?     No fever 7. CARDIAC HISTORY: Do you have any history of heart disease? (e.g., heart attack, congestive heart failure)      Not asked 8. LUNG HISTORY: Do you have any history of lung disease?  (e.g., pulmonary embolus, asthma, emphysema)     COPD 9. PE RISK FACTORS: Do you have a history of blood clots? (or: recent major surgery, recent prolonged travel, bedridden)     Not asked 10. OTHER SYMPTOMS: Do you have any other symptoms? (e.g., runny nose, wheezing, chest pain)       Nose running real bad.   No sore throat.   I'm just coughing so much. 11.  PREGNANCY: Is there any chance you are pregnant? When was your last menstrual period?       N/A due to age 58. TRAVEL: Have you traveled out of the country in the last month? (e.g., travel history, exposures)       N/A  Protocols used: Cough - Acute Productive-A-AH FYI Only or Action Required?: FYI only for provider.  Patient was last seen in primary care on 12/24/2023 by Sharma Coyer, MD.  Called Nurse Triage reporting Cough.  Symptoms began several days ago.  Interventions attempted: Nothing.Pt has COPD so can't take OTC meds due to the many medications she is on.   Productive cough with a very runny nose.  Symptoms are: gradually worsening. Cough getting worse  Triage Disposition: See Physician Within 24 Hours  Patient/caregiver understands and will follow disposition?: Yes

## 2024-01-07 NOTE — Telephone Encounter (Signed)
 Pt has appt scheduled for 01/08/24. We can address concerns during this appt. Please convert to myChart video visit if pt is unable to come to in person visit

## 2024-01-08 ENCOUNTER — Telehealth (INDEPENDENT_AMBULATORY_CARE_PROVIDER_SITE_OTHER): Admitting: Family Medicine

## 2024-01-08 ENCOUNTER — Encounter: Payer: Self-pay | Admitting: Family Medicine

## 2024-01-08 DIAGNOSIS — J441 Chronic obstructive pulmonary disease with (acute) exacerbation: Secondary | ICD-10-CM | POA: Diagnosis not present

## 2024-01-08 MED ORDER — PREDNISONE 20 MG PO TABS: 40.0000 mg | ORAL_TABLET | Freq: Every day | ORAL | 0 refills | Status: AC

## 2024-01-08 MED ORDER — DOXYCYCLINE HYCLATE 100 MG PO TABS: 100.0000 mg | ORAL_TABLET | Freq: Two times a day (BID) | ORAL | 0 refills | Status: DC

## 2024-01-08 NOTE — Progress Notes (Signed)
 MyChart Video Visit    Virtual Visit via Video Note   This format is felt to be most appropriate for this patient at this time. Physical exam was limited by quality of the video and audio technology used for the visit.   Patient location: Patient's home address   Provider location: [facility]  [address], [address]  [place], [place] [address]   I discussed the limitations of evaluation and management by telemedicine and the availability of in person appointments. The patient expressed understanding and agreed to proceed.  Patient: Sara Munoz   DOB: 12-22-1965   58 y.o. Female  MRN: [id number] Visit Date: 01/08/2024  Today's healthcare provider: Rockie Agent, MD   No chief complaint on file.  Subjective    HPI   Discussed the use of AI scribe software for clinical note transcription with the patient, who gave verbal consent to proceed.  History of Present Illness Sara Munoz is a 58 year old female with COPD who presents with a productive cough and worsening wheeze.  She has been experiencing a productive cough that has been worsening, along with significant nasal congestion and a sore nose. No fever or chills are present, but she notes chest tightness and soreness from frequent coughing. These symptoms began acutely last Thursday after feeling well the previous night.  She has a history of obstructive sleep apnea, COPD, GERD, and hepatitis. She is a current tobacco user. She has been using her inhalers as prescribed without missing any doses.      Past Medical History:  Diagnosis Date   Anemia    Anxiety    Aortic atherosclerosis (HCC)    Asthma    Atypical chest pain 08/02/2015   Carotid artery disease (HCC)    Chronic back pain    Chronic nausea    Chronic, continuous use of opioids 02/27/2016   a.) has naloxone  Rx available   COPD (chronic obstructive pulmonary disease) (HCC)    Coronary artery disease    a.) LHC  2011: no sig CAD (report unavailable); b.) LHC 07/07/2014: 10-20% mLM, 25% LAD, 30-40% LCx, 20-30% RCA -- med mgmt; c.) LHC 02/22/2016: 25% LM, 25% pLCx --> med mgmt; d.) MPI 07/10/2017: no ischemia; e.) MPI 09/01/2019: no ischemia; f.) cCTA 10/28/2022: Ca score 1092 (99th percentile for age/sex/race match control)   Current smoker    DDD (degenerative disc disease), cervical    a.) s/p ACDF C5-C7 and PSIF C5-T1   DDD (degenerative disc disease), lumbar 05/23/2020   a.) s/p L5-S1 PLIF   GERD (gastroesophageal reflux disease)    Hepatic steatosis    Hepatitis C virus infection cured after antiviral drug therapy    a.) s/p Tx with standard glecaprevir-pibrentasvir course   Hypercholesteremia    Hyperkalemia    Hyperlipidemia    Hypertension    Hypokalemia    Hypomagnesemia 01/04/2014   Incomplete tear of left rotator cuff    Insomnia    a.) on hypnotic PRN (zolpidem )   MI (myocardial infarction) (HCC) 2012   Migraines    MSSA (methicillin susceptible Staphylococcus aureus) septicemia (HCC) 03/03/2014   a.) TEE (-) for valvular vegitation   Nerve pain    per patient, in the lower back   Osteoarthritis    Pneumonia    PTSD (post-traumatic stress disorder)    PVD (peripheral vascular disease) (HCC)    Tendinitis of left rotator cuff    TIA (transient ischemic attack) 2014    Medications:  Outpatient Medications Prior to Visit  Medication Sig   albuterol  (VENTOLIN  HFA) 108 (90 Base) MCG/ACT inhaler Inhale 2 puffs into the lungs every 6 (six) hours as needed for wheezing or shortness of breath.   amLODipine  (NORVASC ) 5 MG tablet TAKE 1 TABLET BY MOUTH EVERYDAY AT BEDTIME   aspirin  EC 81 MG tablet Take 81 mg by mouth in the morning. Swallow whole.   Atogepant  (QULIPTA ) 60 MG TABS Take 1 tablet (60 mg total) by mouth daily.   atorvastatin  (LIPITOR) 40 MG tablet TAKE 1 TABLET BY MOUTH EVERY DAY IN THE EVENING   budeson-glycopyrrolate -formoterol  (BREZTRI  AEROSPHERE) 160-9-4.8 MCG/ACT  AERO inhaler Inhale 2 puffs into the lungs in the morning and at bedtime.   clonazePAM  (KLONOPIN ) 2 MG tablet Take 1 tablet (2 mg total) by mouth 2 (two) times daily.   docusate sodium  (COLACE) 100 MG capsule Take 100 mg by mouth in the morning.   fenofibrate  (TRICOR ) 48 MG tablet Take 1 tablet (48 mg total) by mouth daily.   gabapentin  (NEURONTIN ) 600 MG tablet Take 600 mg by mouth 4 (four) times daily.   ipratropium-albuterol  (DUONEB) 0.5-2.5 (3) MG/3ML SOLN INHALE 3 ML BY NEBULIZER EVERY 6 HOURS AS NEEDED   isosorbide  mononitrate (IMDUR ) 30 MG 24 hr tablet Take 1 tablet (30 mg total) by mouth daily.   LINZESS  290 MCG CAPS capsule Take 290 mcg by mouth daily as needed (constipation.).   lisinopril  (ZESTRIL ) 20 MG tablet Take 1 tablet (20 mg total) by mouth daily.   LOFENA 25 MG TABS Take by mouth.   metoprolol  tartrate (LOPRESSOR ) 25 MG tablet TAKE ONE-HALF TABLET BY MOUTH  TWICE DAILY   mometasone  (ELOCON ) 0.1 % cream Apply 1 Application topically daily as needed (hand rash).   naloxone  (NARCAN ) 4 MG/0.1ML LIQD nasal spray kit Place 1 spray into the nose as needed (opioid overdose).   omeprazole  (PRILOSEC) 40 MG capsule Take 1 capsule (40 mg total) by mouth 2 (two) times daily.   ondansetron  (ZOFRAN ) 8 MG tablet Take 1 tablet (8 mg total) by mouth every 8 (eight) hours as needed for nausea or vomiting.   oxyCODONE  (ROXICODONE ) 15 MG immediate release tablet Take 15 mg by mouth every 4 (four) hours.   Tiotropium Bromide  Monohydrate (SPIRIVA  RESPIMAT) 2.5 MCG/ACT AERS Take 1 puff by mouth in the morning and at bedtime. (Patient taking differently: Take 1 puff by mouth in the morning.)   tiZANidine  (ZANAFLEX ) 4 MG tablet Take 1 tablet (4 mg total) by mouth every 8 (eight) hours as needed.   umeclidinium bromide  (INCRUSE ELLIPTA ) 62.5 MCG/ACT AEPB Inhale 1 puff into the lungs in the morning.   VENTOLIN  HFA 108 (90 Base) MCG/ACT inhaler USE 1 TO 2 INHALATIONS BY MOUTH  INTO THE LUNGS EVERY 6 HOURS  AS  NEEDED FOR SHORTNESS OF BREATH  OR WHEEZING   zolpidem  (AMBIEN ) 5 MG tablet Take 1 tablet (5 mg total) by mouth at bedtime as needed for sleep.   No facility-administered medications prior to visit.    Review of Systems      Objective    LMP 12/16/2017 (Approximate)       Physical Exam Constitutional:      Appearance: She is ill-appearing. She is not toxic-appearing.  Pulmonary:     Effort: Pulmonary effort is normal. Tachypnea present.  Neurological:     Mental Status: She is alert.     Physical Exam       Assessment & Plan     Problem  List Items Addressed This Visit   None Visit Diagnoses       COPD exacerbation (HCC)    -  Primary   Relevant Medications   predniSONE  (DELTASONE ) 20 MG tablet   doxycycline  (VIBRA -TABS) 100 MG tablet        Assessment & Plan COPD exacerbation Presents with a productive cough and worsening wheeze, consistent with a COPD exacerbation. Symptoms began acutely last Thursday, with no fever or chills. Experiences chest tightness and soreness from coughing. Has COPD and is currently using inhalers as prescribed. The acute worsening of respiratory symptoms supports the diagnosis of a COPD exacerbation. Informed of the need to seek emergency care if experiencing difficulty breathing, lightheadedness, or dizziness, as these could indicate inadequate oxygenation. - Prescribe prednisone  40 mg daily for 5 days. - Prescribe doxycycline  100 mg twice daily for 7 days. - Advise to seek emergency care if experiencing difficulty breathing, lightheadedness, or dizziness.  Tobacco use Current tobacco user -continue to recommend smoking cessation        No follow-ups on file.     I discussed the assessment and treatment plan with the patient. The patient was provided an opportunity to ask questions and all were answered. The patient agreed with the plan and demonstrated an understanding of the instructions.   The patient was  advised to call back or seek an in-person evaluation if the symptoms worsen or if the condition fails to improve as anticipated.  I provided 25 minutes of non-face-to-face time during this encounter.   Rockie Agent, MD [facility] [phone] (phone) [phone] (fax)  [facility]

## 2024-01-09 ENCOUNTER — Other Ambulatory Visit: Payer: Self-pay | Admitting: Family Medicine

## 2024-01-09 DIAGNOSIS — J432 Centrilobular emphysema: Secondary | ICD-10-CM

## 2024-01-13 ENCOUNTER — Other Ambulatory Visit: Payer: Self-pay

## 2024-01-13 NOTE — Patient Instructions (Addendum)
 Visit Information  Thank you for taking time to visit with me today. Please don't hesitate to contact me if I can be of assistance to you before our next scheduled appointment.  Your next care management appointment is by telephone on 02/11/2024 at 10:00 am  Telephone follow-up in 1 month Please call the number on the back of your insurance card for dental and vision providers as discussed.   Please call the care guide team at [phone] if you need to cancel, schedule, or reschedule an appointment.   Please call the Suicide and Crisis Lifeline: 988 call the [place]  National Suicide Prevention Lifeline: 818-046-3309 or TTY: 603-803-0328 TTY (445) 582-9600) to talk to a trained counselor call 1-800-273-TALK (toll free, 24 hour hotline) go to [facility] [address], [place] [phone]) call 911 if you are experiencing a Mental Health or Behavioral Health Crisis or need someone to talk to.  Nestora Duos, MSN, RN [place]  [facility] RN Care Manager Direct Dial: [phone] Fax: [phone]   COPD Action Plan A COPD action plan is a description of what to do when you have a flare (exacerbation) of chronic obstructive pulmonary disease (COPD). Your action plan is a color-coded plan that lists the symptoms that indicate whether your condition is under control and what actions to take. If you have symptoms in the green zone, it means you are doing well that day. If you have symptoms in the yellow zone, it means you are having a bad day or an exacerbation. If you have symptoms in the red zone, you need urgent medical care. Follow the plan that you and your health care provider developed. Review your plan with your health care provider at each visit. Red zone Symptoms in this zone mean that you should get medical help right away. They include: Feeling very short of breath, even when you are resting. Not  being able to do any activities because of poor breathing. Not being able to sleep because of poor breathing. Fever or shaking chills. Feeling confused or very sleepy. Chest pain. Coughing up blood. If you have any of these symptoms, call emergency services (911 in the [place] or go to the nearest emergency room. Yellow zone Symptoms in this zone mean that your condition may be getting worse. They include: Feeling more short of breath than usual. Having less energy for daily activities than usual. Phlegm or mucus that is thicker than usual. Needing to use your rescue inhaler or nebulizer more often than usual. More ankle swelling than usual. Coughing more than usual. Feeling like you have a chest cold. Trouble sleeping due to COPD symptoms. Decreased appetite. COPD medicines not helping as much as usual. If you experience any yellow symptoms: Keep taking your daily medicines as directed. Use your quick-relief inhaler as told by your health care provider. If you were prescribed steroid medicine to take by mouth (oral medicine), start taking it as told by your health care provider. If you were prescribed an antibiotic medicine, start taking it as told by your health care provider. Do not stop taking the antibiotic even if you start to feel better. Use oxygen  as told by your health care provider. Get more rest. Do your pursed-lip breathing exercises. Do not smoke. Avoid any irritants in the air. If your signs and symptoms do not improve after taking these steps, call your health care provider right away. Green zone Symptoms in this zone mean that you are doing well.  They include: Being able to do your usual activities and exercise. Having the usual amount of coughing, including the same amount of phlegm or mucus. Being able to sleep well. Having a good appetite. Where to find more information: You can find more information about COPD from: American Lung Association, My COPD Action  Plan: www.lung.org COPD Foundation: www.copdfoundation.org National Heart, Lung, & Blood Institute: PopSte[employer].is Follow these instructions at home: Continue taking your daily medicines as told by your health care provider. Make sure you receive all the immunizations that your health care provider recommends, especially the pneumococcal and influenza vaccines. Wash your hands often with soap and water . Have family members wash their hands too. Regular hand washing can help prevent infections. Follow your usual exercise and diet plan. Avoid irritants in the air, such as smoke. Do not use any products that contain nicotine  or tobacco. These products include cigarettes, chewing tobacco, and vaping devices, such as e-cigarettes. If you need help quitting, ask your health care provider. Summary A COPD action plan tells you what to do when you have a flare (exacerbation) of chronic obstructive pulmonary disease (COPD). Follow each action plan for your symptoms. If you have any symptoms in the red zone, call emergency services (911 in the [place] or go to the nearest emergency room. This information is not intended to replace advice given to you by your health care provider. Make sure you discuss any questions you have with your health care provider. Document Revised: 04/24/2023 Document Reviewed: 04/24/2023 Elsevier Patient Education  2024 [employer].

## 2024-01-13 NOTE — Patient Outreach (Signed)
 Complex Care Management   Visit Note  01/13/2024  Name:  Sara Munoz MRN: [id number] DOB: Mar 19, 1966  Situation: Referral received for Complex Care Management related to COPD and Anxiety I obtained verbal consent from Patient.  Visit completed with patient  on the phone  Background:   Past Medical History:  Diagnosis Date   Anemia    Anxiety    Aortic atherosclerosis (HCC)    Asthma    Atypical chest pain 08/02/2015   Carotid artery disease (HCC)    Chronic back pain    Chronic nausea    Chronic, continuous use of opioids 02/27/2016   a.) has naloxone  Rx available   COPD (chronic obstructive pulmonary disease) (HCC)    Coronary artery disease    a.) LHC 2011: no sig CAD (report unavailable); b.) LHC 07/07/2014: 10-20% mLM, 25% LAD, 30-40% LCx, 20-30% RCA -- med mgmt; c.) LHC 02/22/2016: 25% LM, 25% pLCx --> med mgmt; d.) MPI 07/10/2017: no ischemia; e.) MPI 09/01/2019: no ischemia; f.) cCTA 10/28/2022: Ca score 1092 (99th percentile for age/sex/race match control)   Current smoker    DDD (degenerative disc disease), cervical    a.) s/p ACDF C5-C7 and PSIF C5-T1   DDD (degenerative disc disease), lumbar 05/23/2020   a.) s/p L5-S1 PLIF   GERD (gastroesophageal reflux disease)    Hepatic steatosis    Hepatitis C virus infection cured after antiviral drug therapy    a.) s/p Tx with standard glecaprevir-pibrentasvir course   Hypercholesteremia    Hyperkalemia    Hyperlipidemia    Hypertension    Hypokalemia    Hypomagnesemia 01/04/2014   Incomplete tear of left rotator cuff    Insomnia    a.) on hypnotic PRN (zolpidem )   MI (myocardial infarction) (HCC) 2012   Migraines    MSSA (methicillin susceptible Staphylococcus aureus) septicemia (HCC) 03/03/2014   a.) TEE (-) for valvular vegitation   Nerve pain    per patient, in the lower back   Osteoarthritis    Pneumonia    PTSD (post-traumatic stress disorder)    PVD (peripheral vascular disease) (HCC)    Tendinitis of  left rotator cuff    TIA (transient ischemic attack) 2014    Assessment: Patient Reported Symptoms:  Cognitive Cognitive Status: No symptoms reported Cognitive/Intellectual Conditions Management [RPT]: None reported or documented in medical history or problem list   Health Maintenance Behaviors: Annual physical exam, Spiritual practice(s) Healing Pattern: Average Health Facilitated by: Pain control, Rest, Prayer/meditation  Neurological Neurological Review of Symptoms: Headaches, Numbness Neurological Management Strategies: Adequate rest, Medication therapy, Routine screening Neurological Comment: working with neurology, no change at this time  HEENT HEENT Symptoms Reported: Tinnitus, Nasal discharge HEENT Management Strategies: Routine screening, Medication therapy    Cardiovascular Cardiovascular Symptoms Reported: No symptoms reported Does patient have uncontrolled Hypertension?: No Cardiovascular Management Strategies: Medication therapy, Routine screening Weight: 176 lb (79.8 kg) Cardiovascular Comment: no new chest pain, reviewed Red Flags for ED  Respiratory Respiratory Symptoms Reported: Productive cough, Shortness of breath, Wheezing Other Respiratory Symptoms: Currently being treated for COPD exacerbation, doxycycline  and predinsone - improved but still with runny nose, wheeze and SOB at times, productive cough for yellow sputum, no symptoms fever, thinks needs longer course of antibiotics and presdnisone, RNCM will send note to PCP as requested Additional Respiratory Details: trying to reduce smoking - now < 1/2 pack - discussed methods to reduce 1 cig a day while ill Respiratory Management Strategies: Adequate rest, Medication therapy, Routine screening Respiratory  Self-Management Outcome: 3 (uncertain)  Endocrine Endocrine Symptoms Reported: No symptoms reported Is patient diabetic?: No    Gastrointestinal Gastrointestinal Symptoms Reported: Other, Nausea Other  Gastrointestinal Symptoms: nausea worse  with antibiotic - Zofran  helping Gastrointestinal Management Strategies: Medication therapy    Genitourinary Genitourinary Symptoms Reported: No symptoms reported    Integumentary Integumentary Symptoms Reported: No symptoms reported    Musculoskeletal Musculoskelatal Symptoms Reviewed: Back pain, Difficulty walking, Joint pain, Limited mobility, Muscle pain, Unsteady gait, Weakness Additional Musculoskeletal Details: no change pain rating 7/10 for back and neck Musculoskeletal Management Strategies: Adequate rest, Exercise, Routine screening, Medication therapy, Medical device Falls in the past year?: Yes Number of falls in past year: 1 or less Was there an injury with Fall?: No Fall Risk Category Calculator: 1 Patient Fall Risk Level: Low Fall Risk Patient at Risk for Falls Due to: History of fall(s), Impaired balance/gait, Impaired mobility, Medication side effect Fall risk Follow up: Falls evaluation completed, Falls prevention discussed  Psychosocial Psychosocial Symptoms Reported: Anxiety - if selected complete GAD, Depression - if selected complete PHQ 2-9, Difficulty concentrating Additional Psychological Details: Acute appointment 01/08/24 - will discuss 02/10/24 Behavioral Management Strategies: Medication therapy, Adequate rest Major Change/Loss/Stressor/Fears (CP): Medical condition, self Techniques to Cope with Loss/Stress/Change: Diversional activities, Medication, Spiritual practice(s) Quality of Family Relationships: stressful Do you feel physically threatened by others?: No      01/13/2024   10:53 AM  Depression screen PHQ 2/9  Decreased Interest 1  Down, Depressed, Hopeless 1  PHQ - 2 Score 2  Altered sleeping 1  Tired, decreased energy 1  Change in appetite 1  Feeling bad or failure about yourself  0  Trouble concentrating 2  Moving slowly or fidgety/restless 1  Suicidal thoughts 0  PHQ-9 Score 8  Difficult doing  work/chores Somewhat difficult    There were no vitals filed for this visit.  Medications Reviewed Today     Reviewed by Devra Lands, RN (Registered Nurse) on 01/13/24 at 1040  Med List Status: <None>   Medication Order Taking? Sig Documenting Provider Last Dose Status Informant  albuterol  (VENTOLIN  HFA) 108 (90 Base) MCG/ACT inhaler [id number] Yes Inhale 2 puffs into the lungs every 6 (six) hours as needed for wheezing or shortness of breath. Malka Domino, MD  Active   amLODipine  (NORVASC ) 5 MG tablet [id number] Yes TAKE 1 TABLET BY MOUTH EVERYDAY AT BEDTIME [name], [name], MD  Active   aspirin  EC 81 MG tablet [id number] Yes Take 81 mg by mouth in the morning. Swallow whole. [provider]  Active Self  Atogepant  (QULIPTA ) 60 MG TABS [id number]  Take 1 tablet (60 mg total) by mouth daily. Skeet Juliene SAUNDERS, DO  Active            Med Note CENA KEELS D   Thu Nov 27, 2023  1:41 PM) Haven't started yet. Awaiting prior authorization.  atorvastatin  (LIPITOR) 40 MG tablet [id number] Yes TAKE 1 TABLET BY MOUTH EVERY DAY IN THE KARNA Darliss Rogue, MD  Active   budeson-glycopyrrolate -formoterol  (BREZTRI  AEROSPHERE) 160-9-4.8 MCG/ACT AERO inhaler [id number] Yes Inhale 2 puffs into the lungs in the morning and at bedtime. Malka Domino, MD  Active   clonazePAM  (KLONOPIN ) 2 MG tablet [id number] Yes Take 1 tablet (2 mg total) by mouth 2 (two) times daily. [name], [name], MD  Active   docusate sodium  (COLACE) 100 MG capsule [id number] Yes Take 100 mg by mouth in the morning. [provider]  Active Self  doxycycline  (VIBRA -TABS) 100 MG tablet  [id number] Yes Take 1 tablet (100 mg total) by mouth 2 (two) times daily for 7 days. [name], [name], MD  Active   fenofibrate  (TRICOR ) 48 MG tablet [id number] Yes Take 1 tablet (48 mg total) by mouth daily. [name], [name], MD  Active   gabapentin  (NEURONTIN ) 600 MG tablet [id number] Yes Take  600 mg by mouth 4 (four) times daily. [provider]  Active Self  ipratropium-albuterol  (DUONEB) 0.5-2.5 (3) MG/3ML SOLN [id number] Yes INHALE 3 ML BY NEBULIZER EVERY 6 HOURS AS NEEDED Assaker, Darrin, MD  Active   isosorbide  mononitrate (IMDUR ) 30 MG 24 hr tablet [id number] Yes Take 1 tablet (30 mg total) by mouth daily. Gerard Frederick, NP  Active   LINZESS  290 MCG CAPS capsule [id number]  Take 290 mcg by mouth daily as needed (constipation.). [provider]  Active Self  lisinopril  (ZESTRIL ) 20 MG tablet [id number] Yes Take 1 tablet (20 mg total) by mouth daily. [name], Rockie, MD  Active   LOFENA 25 MG TABS [id number]  Take by mouth. [provider]  Active   metoprolol  tartrate (LOPRESSOR ) 25 MG tablet [id number] Yes TAKE ONE-HALF TABLET BY MOUTH  TWICE DAILY [name], [name], MD  Active Self  mometasone  (ELOCON ) 0.1 % cream [id number] Yes Apply 1 Application topically daily as needed (hand rash). Poggi, Norleen PARAS, MD  Active Self  naloxone  (NARCAN ) 4 MG/0.1ML LIQD nasal spray kit [id number] Yes Place 1 spray into the nose as needed (opioid overdose). [provider]  Active Self           Med Note SOILA LYLE JAYSON [place] May 03, 2021 11:23 AM)    omeprazole  (PRILOSEC) 40 MG capsule [id number] Yes Take 1 capsule (40 mg total) by mouth 2 (two) times daily. Therisa Bi, MD  Active Self  ondansetron  (ZOFRAN ) 8 MG tablet [id number] Yes Take 1 tablet (8 mg total) by mouth every 8 (eight) hours as needed for nausea or vomiting. [name], [name], MD  Active Self  oxyCODONE  (ROXICODONE ) 15 MG immediate release tablet [id number] Yes Take 15 mg by mouth every 4 (four) hours. [provider]  Active Self  predniSONE  (DELTASONE ) 20 MG tablet [id number] Yes Take 2 tablets (40 mg total) by mouth daily with breakfast for 5 days. [name], [name], MD  Active   Tiotropium Bromide  Monohydrate (SPIRIVA  RESPIMAT) 2.5 MCG/ACT AERS [id number]  Yes Take 1 puff by mouth in the morning and at bedtime. Emilio Kelly DASEN, FNP  Active Self  tiZANidine  (ZANAFLEX ) 4 MG tablet [id number]  Take 1 tablet (4 mg total) by mouth every 8 (eight) hours as needed.  Patient not taking: Reported on 01/13/2024   Ulis LYLE, [name]  Active   umeclidinium bromide  (INCRUSE ELLIPTA ) 62.5 MCG/ACT AEPB [id number] Yes Inhale 1 puff into the lungs in the morning. [provider]  Active Self  VENTOLIN  HFA 108 (90 Base) MCG/ACT inhaler [id number] Yes USE 1 TO 2 INHALATIONS BY MOUTH  INTO THE LUNGS EVERY 6 HOURS AS  NEEDED FOR SHORTNESS OF BREATH  OR WHEEZING [name], [name], MD  Active   zolpidem  (AMBIEN ) 5 MG tablet [id number] Yes Take 1 tablet (5 mg total) by mouth at bedtime as needed for sleep. [name], Rockie, MD  Active Self            Recommendation:   PCP Follow-up Continue Current Plan of Care  Follow Up Plan:   Telephone follow-up in 1 month  Nestora Duos, MSN, RN [place]  [facility], [facility] RN Care  Manager Direct Dial: [phone] Fax: [phone]

## 2024-01-14 ENCOUNTER — Ambulatory Visit: Admitting: Pulmonary Disease

## 2024-01-14 ENCOUNTER — Other Ambulatory Visit: Payer: Self-pay | Admitting: Family Medicine

## 2024-01-14 DIAGNOSIS — R110 Nausea: Secondary | ICD-10-CM

## 2024-01-14 DIAGNOSIS — J441 Chronic obstructive pulmonary disease with (acute) exacerbation: Secondary | ICD-10-CM

## 2024-01-14 NOTE — Telephone Encounter (Signed)
 Copied from CRM [id number]. Topic: Clinical - Medication Refill >> Jan 14, 2024 10:58 AM Myrick T wrote: Medication: doxycycline  (VIBRA -TABS) 100 MG tablet, ondansetron  (ZOFRAN ) 8 MG tablet and predniSONE  (DELTASONE ) 20 MG tablet  Has the patient contacted their pharmacy? No  This is the patient's preferred pharmacy:  [id number] - [place], [place] - [address] [place] [address] Phone: [phone] Fax: [phone]  Is this the correct pharmacy for this prescription? Yes  Has the prescription been filled recently? Yes  Is the patient out of the medication? Yes  Has the patient been seen for an appointment in the last year OR does the patient have an upcoming appointment? Yes  Can we respond through MyChart? Yes  Agent: Please be advised that Rx refills may take up to 3 business days. We ask that you follow-up with your pharmacy.

## 2024-01-16 NOTE — Telephone Encounter (Signed)
 Requested medication (s) are due for refill today: routing for review  Requested medication (s) are on the active medication list: yes  //Last refill:  01/08/24 and 08/19/23  Future visit scheduled: yes  Notes to clinic:  Unable to refill per protocol, cannot delegate.      Requested Prescriptions  Pending Prescriptions Disp Refills   doxycycline  (VIBRA -TABS) 100 MG tablet 14 tablet 0    Sig: Take 1 tablet (100 mg total) by mouth 2 (two) times daily for 7 days.     Off-Protocol Failed - 01/16/2024  9:06 AM      Failed - Medication not assigned to a protocol, review manually.      Passed - Valid encounter within last 12 months    Recent Outpatient Visits           1 week ago COPD exacerbation (HCC)   [place] [facility] [name], [place], MD   3 weeks ago GAD (generalized anxiety disorder)   [place] [facility] [name], [place], MD   2 months ago GAD (generalized anxiety disorder)   [place] [facility] [name], [place], MD   3 months ago Essential hypertension   [place] [facility] [place], [place], MD   3 months ago Swelling at injection site   [facility] [place], Janna, [name]               ondansetron  (ZOFRAN ) 8 MG tablet 90 tablet 1    Sig: Take 1 tablet (8 mg total) by mouth every 8 (eight) hours as needed for nausea or vomiting.     Not Delegated - Gastroenterology: Antiemetics - ondansetron  Failed - 01/16/2024  9:06 AM      Failed - This refill cannot be delegated      Passed - AST in normal range and within 360 days    AST  Date Value Ref Range Status  09/21/2023 18 15 - 41 U/L Final         Passed - ALT in normal range and within 360 days    ALT  Date Value Ref Range Status  09/21/2023 12 0 - 44 U/L Final         Passed - Valid encounter within last 6 months    Recent Outpatient Visits           1 week ago COPD  exacerbation (HCC)   [place] [facility] [name], [place], MD   3 weeks ago GAD (generalized anxiety disorder)   [place] [facility] [name], [place], MD   2 months ago GAD (generalized anxiety disorder)   [place] [facility] [name], [place], MD   3 months ago Essential hypertension   [place] [facility] [place], [place], MD   3 months ago Swelling at injection site   [place] [facility] [place], Janna, [name]

## 2024-01-20 MED ORDER — DOXYCYCLINE HYCLATE 100 MG PO TABS: 100.0000 mg | ORAL_TABLET | Freq: Two times a day (BID) | ORAL | 0 refills | Status: AC

## 2024-01-20 MED ORDER — ONDANSETRON HCL 8 MG PO TABS: 8.0000 mg | ORAL_TABLET | Freq: Three times a day (TID) | ORAL | 1 refills | Status: DC | PRN

## 2024-01-21 DIAGNOSIS — Z9889 Other specified postprocedural states: Secondary | ICD-10-CM | POA: Diagnosis not present

## 2024-01-21 DIAGNOSIS — G8929 Other chronic pain: Secondary | ICD-10-CM | POA: Diagnosis not present

## 2024-01-21 DIAGNOSIS — M25511 Pain in right shoulder: Secondary | ICD-10-CM | POA: Diagnosis not present

## 2024-01-21 DIAGNOSIS — S46211D Strain of muscle, fascia and tendon of other parts of biceps, right arm, subsequent encounter: Secondary | ICD-10-CM | POA: Diagnosis not present

## 2024-01-21 DIAGNOSIS — M7581 Other shoulder lesions, right shoulder: Secondary | ICD-10-CM | POA: Diagnosis not present

## 2024-01-22 DIAGNOSIS — G894 Chronic pain syndrome: Secondary | ICD-10-CM | POA: Diagnosis not present

## 2024-01-22 DIAGNOSIS — M545 Low back pain, unspecified: Secondary | ICD-10-CM | POA: Diagnosis not present

## 2024-01-22 DIAGNOSIS — M25512 Pain in left shoulder: Secondary | ICD-10-CM | POA: Diagnosis not present

## 2024-01-22 DIAGNOSIS — M542 Cervicalgia: Secondary | ICD-10-CM | POA: Diagnosis not present

## 2024-01-29 DIAGNOSIS — M542 Cervicalgia: Secondary | ICD-10-CM | POA: Diagnosis not present

## 2024-02-10 ENCOUNTER — Encounter: Payer: Self-pay | Admitting: Family Medicine

## 2024-02-10 ENCOUNTER — Ambulatory Visit (INDEPENDENT_AMBULATORY_CARE_PROVIDER_SITE_OTHER): Admitting: Family Medicine

## 2024-02-10 VITALS — BP 147/87 | HR 61 | Resp 14 | Ht 64.0 in | Wt 186.8 lb

## 2024-02-10 DIAGNOSIS — F411 Generalized anxiety disorder: Secondary | ICD-10-CM

## 2024-02-10 DIAGNOSIS — E782 Mixed hyperlipidemia: Secondary | ICD-10-CM | POA: Diagnosis not present

## 2024-02-10 DIAGNOSIS — E875 Hyperkalemia: Secondary | ICD-10-CM

## 2024-02-10 DIAGNOSIS — F17210 Nicotine dependence, cigarettes, uncomplicated: Secondary | ICD-10-CM

## 2024-02-10 DIAGNOSIS — L309 Dermatitis, unspecified: Secondary | ICD-10-CM

## 2024-02-10 MED ORDER — CLONAZEPAM 2 MG PO TABS: 2.0000 mg | ORAL_TABLET | Freq: Two times a day (BID) | ORAL | 2 refills | Status: DC

## 2024-02-10 MED ORDER — MOMETASONE FUROATE 0.1 % EX CREA: 1.0000 | TOPICAL_CREAM | Freq: Every day | CUTANEOUS | 1 refills | Status: DC | PRN

## 2024-02-10 MED ORDER — CLOTRIMAZOLE-BETAMETHASONE 1-0.05 % EX CREA: 1.0000 | TOPICAL_CREAM | Freq: Every day | CUTANEOUS | 0 refills | Status: DC

## 2024-02-10 NOTE — Progress Notes (Unsigned)
 Established patient visit   Patient: Sara Munoz   DOB: 10-30-1965   58 y.o. Female  MRN: [id number] Visit Date: 02/10/2024  Today's healthcare provider: Rockie Agent, MD   Chief Complaint  Patient presents with  . Anxiety   Subjective       Discussed the use of AI scribe software for clinical note transcription with the patient, who gave verbal consent to proceed.  History of Present Illness      Past Medical History:  Diagnosis Date  . Anemia   . Anxiety   . Aortic atherosclerosis (HCC)   . Asthma   . Atypical chest pain 08/02/2015  . Carotid artery disease (HCC)   . Chronic back pain   . Chronic nausea   . Chronic, continuous use of opioids 02/27/2016   a.) has naloxone  Rx available  . COPD (chronic obstructive pulmonary disease) (HCC)   . Coronary artery disease    a.) LHC 2011: no sig CAD (report unavailable); b.) LHC 07/07/2014: 10-20% mLM, 25% LAD, 30-40% LCx, 20-30% RCA -- med mgmt; c.) LHC 02/22/2016: 25% LM, 25% pLCx --> med mgmt; d.) MPI 07/10/2017: no ischemia; e.) MPI 09/01/2019: no ischemia; f.) cCTA 10/28/2022: Ca score 1092 (99th percentile for age/sex/race match control)  . Current smoker   . DDD (degenerative disc disease), cervical    a.) s/p ACDF C5-C7 and PSIF C5-T1  . DDD (degenerative disc disease), lumbar 05/23/2020   a.) s/p L5-S1 PLIF  . GERD (gastroesophageal reflux disease)   . Hepatic steatosis   . Hepatitis C virus infection cured after antiviral drug therapy    a.) s/p Tx with standard glecaprevir-pibrentasvir course  . Hypercholesteremia   . Hyperkalemia   . Hyperlipidemia   . Hypertension   . Hypokalemia   . Hypomagnesemia 01/04/2014  . Incomplete tear of left rotator cuff   . Insomnia    a.) on hypnotic PRN (zolpidem )  . MI (myocardial infarction) (HCC) 2012  . Migraines   . MSSA (methicillin susceptible Staphylococcus aureus) septicemia (HCC) 03/03/2014   a.) TEE (-) for valvular vegitation  . Nerve  pain    per patient, in the lower back  . Osteoarthritis   . Pneumonia   . PTSD (post-traumatic stress disorder)   . PVD (peripheral vascular disease) (HCC)   . Tendinitis of left rotator cuff   . TIA (transient ischemic attack) 2014    Medications: Outpatient Medications Prior to Visit  Medication Sig  . albuterol  (VENTOLIN  HFA) 108 (90 Base) MCG/ACT inhaler Inhale 2 puffs into the lungs every 6 (six) hours as needed for wheezing or shortness of breath.  . amLODipine  (NORVASC ) 5 MG tablet TAKE 1 TABLET BY MOUTH EVERYDAY AT BEDTIME  . aspirin  EC 81 MG tablet Take 81 mg by mouth in the morning. Swallow whole.  . atorvastatin  (LIPITOR) 40 MG tablet TAKE 1 TABLET BY MOUTH EVERY DAY IN THE EVENING  . budeson-glycopyrrolate -formoterol  (BREZTRI  AEROSPHERE) 160-9-4.8 MCG/ACT AERO inhaler Inhale 2 puffs into the lungs in the morning and at bedtime.  . docusate sodium  (COLACE) 100 MG capsule Take 100 mg by mouth in the morning.  . fenofibrate  (TRICOR ) 48 MG tablet Take 1 tablet (48 mg total) by mouth daily.  . gabapentin  (NEURONTIN ) 600 MG tablet Take 600 mg by mouth 4 (four) times daily.  SABRA ipratropium-albuterol  (DUONEB) 0.5-2.5 (3) MG/3ML SOLN INHALE 3 ML BY NEBULIZER EVERY 6 HOURS AS NEEDED  . isosorbide  mononitrate (IMDUR ) 30 MG 24 hr tablet Take  1 tablet (30 mg total) by mouth daily.  . lisinopril  (ZESTRIL ) 20 MG tablet Take 1 tablet (20 mg total) by mouth daily.  . metoprolol  tartrate (LOPRESSOR ) 25 MG tablet TAKE ONE-HALF TABLET BY MOUTH  TWICE DAILY  . naloxone  (NARCAN ) 4 MG/0.1ML LIQD nasal spray kit Place 1 spray into the nose as needed (opioid overdose).  . omeprazole  (PRILOSEC) 40 MG capsule Take 1 capsule (40 mg total) by mouth 2 (two) times daily.  . ondansetron  (ZOFRAN ) 8 MG tablet Take 1 tablet (8 mg total) by mouth every 8 (eight) hours as needed for nausea or vomiting.  . oxyCODONE  (ROXICODONE ) 15 MG immediate release tablet Take 15 mg by mouth every 4 (four) hours.  .  Tiotropium Bromide  Monohydrate (SPIRIVA  RESPIMAT) 2.5 MCG/ACT AERS Take 1 puff by mouth in the morning and at bedtime.  . umeclidinium bromide  (INCRUSE ELLIPTA ) 62.5 MCG/ACT AEPB Inhale 1 puff into the lungs in the morning.  . VENTOLIN  HFA 108 (90 Base) MCG/ACT inhaler USE 1 TO 2 INHALATIONS BY MOUTH  INTO THE LUNGS EVERY 6 HOURS AS  NEEDED FOR SHORTNESS OF BREATH  OR WHEEZING  . zolpidem  (AMBIEN ) 5 MG tablet Take 1 tablet (5 mg total) by mouth at bedtime as needed for sleep.  . [DISCONTINUED] clonazePAM  (KLONOPIN ) 2 MG tablet Take 1 tablet (2 mg total) by mouth 2 (two) times daily.  . [DISCONTINUED] mometasone  (ELOCON ) 0.1 % cream Apply 1 Application topically daily as needed (hand rash).  . tiZANidine  (ZANAFLEX ) 4 MG tablet Take 1 tablet (4 mg total) by mouth every 8 (eight) hours as needed. (Patient not taking: Reported on 01/13/2024)  . [DISCONTINUED] Atogepant  (QULIPTA ) 60 MG TABS Take 1 tablet (60 mg total) by mouth daily.  . [DISCONTINUED] LINZESS  290 MCG CAPS capsule Take 290 mcg by mouth daily as needed (constipation.).  . [DISCONTINUED] LOFENA 25 MG TABS Take by mouth.   No facility-administered medications prior to visit.    Review of Systems  Last CBC Lab Results  Component Value Date   WBC 8.8 10/02/2023   HGB 14.0 10/02/2023   HCT 46.8 (H) 10/02/2023   MCV 79 10/02/2023   MCH 23.5 (L) 10/02/2023   RDW 27.8 (H) 10/02/2023   PLT 366 10/02/2023   Last metabolic panel Lab Results  Component Value Date   GLUCOSE 99 10/02/2023   NA 137 10/02/2023   K 5.9 (H) 10/02/2023   CL 97 10/02/2023   CO2 23 10/02/2023   BUN 14 10/02/2023   CREATININE 0.73 10/02/2023   EGFR 97 10/02/2023   CALCIUM  10.0 10/02/2023   PHOS 2.7 09/16/2023   PROT 7.1 09/21/2023   ALBUMIN  3.8 09/21/2023   LABGLOB 2.5 01/06/2023   AGRATIO 1.6 04/03/2022   BILITOT <0.1 09/21/2023   ALKPHOS 68 09/21/2023   AST 18 09/21/2023   ALT 12 09/21/2023   ANIONGAP 10 09/21/2023   Last lipids Lab Results   Component Value Date   CHOL 249 (H) 10/02/2023   HDL 31 (L) 10/02/2023   LDLCALC 118 (H) 10/02/2023   TRIG 560 (HH) 10/02/2023   CHOLHDL 8.0 (H) 10/02/2023   The ASCVD Risk score (Arnett DK, et al., 2019) failed to calculate for the following reasons:   Risk score cannot be calculated because patient has a medical history suggesting prior/existing ASCVD  Last hemoglobin A1c Lab Results  Component Value Date   HGBA1C 6.0 (H) 01/06/2023   Last thyroid  functions Lab Results  Component Value Date   TSH 1.060 01/06/2023  Last vitamin D  Lab Results  Component Value Date   25OHVITD2 <1.0 03/01/2016   25OHVITD3 32 03/01/2016   VD25OH 35.49 09/14/2023   Last vitamin B12 and Folate Lab Results  Component Value Date   VITAMINB12 317 09/13/2023   FOLATE 24.0 09/13/2023     {See past labs  Heme  Chem  Endocrine  Serology  Results Review (optional):1}   Objective    BP (!) 147/87 (BP Location: Left Arm, Patient Position: Sitting, Cuff Size: Normal)   Pulse 61   Resp 14   Ht 5' 4 (1.626 m)   Wt 186 lb 12.8 oz (84.7 kg)   LMP 12/16/2017 (Approximate)   BMI 32.06 kg/m  BP Readings from Last 3 Encounters:  02/10/24 (!) 147/87  12/31/23 117/72  12/08/23 120/76   Wt Readings from Last 3 Encounters:  02/10/24 186 lb 12.8 oz (84.7 kg)  01/13/24 176 lb (79.8 kg)  12/31/23 177 lb (80.3 kg)    {See vitals history (optional):1}    Physical Exam  ***  No results found for any visits on 02/10/24.  Assessment & Plan     Problem List Items Addressed This Visit       Other   GAD (generalized anxiety disorder) - Primary   Relevant Medications   clonazePAM  (KLONOPIN ) 2 MG tablet   Other Visit Diagnoses       Elevated triglycerides with high cholesterol       Relevant Orders   Lipid panel     Hyperkalemia       Relevant Orders   BMP8+EGFR     Dermatitis       Relevant Medications   mometasone  (ELOCON ) 0.1 % cream   clotrimazole -betamethasone  (LOTRISONE )  cream       Assessment and Plan Assessment & Plan      Return in about 3 months (around 05/12/2024) for COPD, HTN, Cholesterol.         Rockie Agent, MD  [facility] [phone] (phone) [phone] (fax)  [facility]

## 2024-02-11 ENCOUNTER — Other Ambulatory Visit: Payer: Self-pay

## 2024-02-11 ENCOUNTER — Ambulatory Visit: Payer: Self-pay | Admitting: Family Medicine

## 2024-02-11 LAB — BMP8+EGFR
BUN/Creatinine Ratio: 14 (ref 9–23)
BUN: 13 mg/dL (ref 6–24)
CO2: 22 mmol/L (ref 20–29)
Calcium: 9.7 mg/dL (ref 8.7–10.2)
Chloride: 101 mmol/L (ref 96–106)
Creatinine, Ser: 0.92 mg/dL (ref 0.57–1.00)
Glucose: 72 mg/dL (ref 70–99)
Potassium: 4.9 mmol/L (ref 3.5–5.2)
Sodium: 139 mmol/L (ref 134–144)
eGFR: 73 mL/min/1.73 (ref >59)

## 2024-02-11 LAB — LIPID PANEL
Chol/HDL Ratio: 4.6 ratio — ABNORMAL HIGH (ref 0.0–4.4)
Cholesterol, Total: 174 mg/dL (ref 100–199)
HDL: 38 mg/dL — ABNORMAL LOW (ref >39)
LDL Chol Calc (NIH): 105 mg/dL — ABNORMAL HIGH (ref 0–99)
Triglycerides: 179 mg/dL — ABNORMAL HIGH (ref 0–149)
VLDL Cholesterol Cal: 31 mg/dL (ref 5–40)

## 2024-02-11 NOTE — Patient Outreach (Deleted)
 Complex Care Management   Visit Note  02/11/2024  Name:  Sara Munoz MRN: [id number] DOB: [birth year]  Situation: Referral received for Complex Care Management related to {Criteria:32550} I obtained verbal consent from {CHL AMB Patient/Caregiver:28184}.  Visit completed with {CHL AMB Patient/Caregiver:28184}  {VISIT LOCATION:32553}  Background:   Past Medical History:  Diagnosis Date   Anemia    Anxiety    Aortic atherosclerosis (HCC)    Asthma    Atypical chest pain 08/02/2015   Carotid artery disease (HCC)    Chronic back pain    Chronic nausea    Chronic, continuous use of opioids 02/27/2016   a.) has naloxone  Rx available   COPD (chronic obstructive pulmonary disease) (HCC)    Coronary artery disease    a.) LHC 2011: no sig CAD (report unavailable); b.) LHC 07/07/2014: 10-20% mLM, 25% LAD, 30-40% LCx, 20-30% RCA -- med mgmt; c.) LHC 02/22/2016: 25% LM, 25% pLCx --> med mgmt; d.) MPI 07/10/2017: no ischemia; e.) MPI 09/01/2019: no ischemia; f.) cCTA 10/28/2022: Ca score 1092 (99th percentile for age/sex/race match control)   Current smoker    DDD (degenerative disc disease), cervical    a.) s/p ACDF C5-C7 and PSIF C5-T1   DDD (degenerative disc disease), lumbar 05/23/2020   a.) s/p L5-S1 PLIF   GERD (gastroesophageal reflux disease)    Hepatic steatosis    Hepatitis C virus infection cured after antiviral drug therapy    a.) s/p Tx with standard glecaprevir-pibrentasvir course   Hypercholesteremia    Hyperkalemia    Hyperlipidemia    Hypertension    Hypokalemia    Hypomagnesemia 01/04/2014   Incomplete tear of left rotator cuff    Insomnia    a.) on hypnotic PRN (zolpidem )   MI (myocardial infarction) (HCC) 2012   Migraines    MSSA (methicillin susceptible Staphylococcus aureus) septicemia (HCC) 03/03/2014   a.) TEE (-) for valvular vegitation   Nerve pain    per patient, in the lower back   Osteoarthritis    Pneumonia    PTSD (post-traumatic stress disorder)     PVD (peripheral vascular disease) (HCC)    Tendinitis of left rotator cuff    TIA (transient ischemic attack) 2014    Assessment: Patient Reported Symptoms:  Cognitive Cognitive Status: No symptoms reported Cognitive/Intellectual Conditions Management [RPT]: None reported or documented in medical history or problem list   Health Maintenance Behaviors: Annual physical exam Healing Pattern: Average Health Facilitated by: Pain control, Prayer/meditation  Neurological Neurological Review of Symptoms: No symptoms reported    HEENT HEENT Symptoms Reported: No symptoms reported      Cardiovascular Cardiovascular Symptoms Reported: No symptoms reported Cardiovascular Management Strategies: Routine screening, Medication therapy Weight: 186 lb (84.4 kg)  Respiratory Respiratory Symptoms Reported: Wheezing, Dry cough Other Respiratory Symptoms: wheeze and cough as per usual - using prn inhaler/neb but no more than usual - aware to notify provider immediately of any worseingin of normal symptoms Additional Respiratory Details: smoking 1/2 pack day - no reduction Respiratory Management Strategies: Routine screening, Medication therapy  Endocrine Endocrine Symptoms Reported: No symptoms reported Is patient diabetic?: No    Gastrointestinal Other Gastrointestinal Symptoms: wiull make appointment for GI - stomach cramping at times, having some heartburn with certain foods Gastrointestinal Management Strategies: Medication therapy    Genitourinary Genitourinary Symptoms Reported: No symptoms reported    Integumentary Additional Integumentary Details: inside thing - irritated from heat - cream helping, right foot breakout and peeling no itch - cream helping Skin  Management Strategies: Medication therapy  Musculoskeletal Additional Musculoskeletal Details: Back and neck pain 7/10 usually - will be trying neck injection but ststes will likely need surgery Musculoskeletal Management Strategies:  Adequate rest, Exercise, Medication therapy, Routine screening Musculoskeletal Self-Management Outcome: 3 (uncertain) Falls in the past year?: Yes Number of falls in past year: 1 or less Was there an injury with Fall?: No Fall Risk Category Calculator: 1 Patient Fall Risk Level: Low Fall Risk Patient at Risk for Falls Due to: History of fall(s), Impaired balance/gait, Impaired mobility, Medication side effect Fall risk Follow up: Falls evaluation completed, Falls prevention discussed  Psychosocial Psychosocial Symptoms Reported: Anxiety - if selected complete GAD, Depression - if selected complete PHQ 2-9 Additional Psychological Details: states mood ok - going to beach and excited, spoke with Dr Lang yesterday and feeling better - aware LCSW available prn Behavioral Management Strategies: Medication therapy   Quality of Family Relationships: stressful    02/11/2024    PHQ2-9 Depression Screening   Little interest or pleasure in doing things Several days  Feeling down, depressed, or hopeless Not at all  PHQ-2 - Total Score 1  Trouble falling or staying asleep, or sleeping too much    Feeling tired or having little energy    Poor appetite or overeating     Feeling bad about yourself - or that you are a failure or have let yourself or your family down    Trouble concentrating on things, such as reading the newspaper or watching television    Moving or speaking so slowly that other people could have noticed.  Or the opposite - being so fidgety or restless that you have been moving around a lot more than usual    Thoughts that you would be better off dead, or hurting yourself in some way    PHQ2-9 Total Score    If you checked off any problems, how difficult have these problems made it for you to do your work, take care of things at home, or get along with other people    Depression Interventions/Treatment      Vitals:   02/11/24 1007  BP: (!) 142/90    Medications Reviewed Today      Reviewed by Devra Lands, RN (Registered Nurse) on 02/11/24 at 1004  Med List Status: <None>   Medication Order Taking? Sig Documenting Provider Last Dose Status Informant  albuterol  (VENTOLIN  HFA) 108 (90 Base) MCG/ACT inhaler [id number] Yes Inhale 2 puffs into the lungs every 6 (six) hours as needed for wheezing or shortness of breath. Malka Domino, MD  Active   amLODipine  (NORVASC ) 5 MG tablet [id number] Yes TAKE 1 TABLET BY MOUTH EVERYDAY AT BEDTIME [name], [name], MD  Active   aspirin  EC 81 MG tablet [id number] Yes Take 81 mg by mouth in the morning. Swallow whole. [provider]  Active Self  atorvastatin  (LIPITOR) 40 MG tablet [id number] Yes TAKE 1 TABLET BY MOUTH EVERY DAY IN THE KARNA Darliss Rogue, MD  Active   budeson-glycopyrrolate -formoterol  (BREZTRI  AEROSPHERE) 160-9-4.8 MCG/ACT AERO inhaler [id number] Yes Inhale 2 puffs into the lungs in the morning and at bedtime. Malka Domino, MD  Active   clonazePAM  (KLONOPIN ) 2 MG tablet [id number] Yes Take 1 tablet (2 mg total) by mouth 2 (two) times daily. [name], [name], MD  Active   clotrimazole -betamethasone  (LOTRISONE ) cream [id number] Yes Apply 1 Application topically at bedtime. [name], [name], MD  Active   docusate sodium  (COLACE) 100 MG capsule [id number] Yes Take 100 mg by mouth  in the morning. [provider]  Active Self  fenofibrate  (TRICOR ) 48 MG tablet [id number] Yes Take 1 tablet (48 mg total) by mouth daily. [name], [name], MD  Active   gabapentin  (NEURONTIN ) 600 MG tablet [id number] Yes Take 600 mg by mouth 4 (four) times daily. [provider]  Active Self  ipratropium-albuterol  (DUONEB) 0.5-2.5 (3) MG/3ML SOLN [id number] Yes INHALE 3 ML BY NEBULIZER EVERY 6 HOURS AS NEEDED Assaker, Darrin, MD  Active   isosorbide  mononitrate (IMDUR ) 30 MG 24 hr tablet [id number] Yes Take 1 tablet (30 mg total) by mouth daily. Gerard Frederick, NP   Active   lisinopril  (ZESTRIL ) 20 MG tablet [id number] Yes Take 1 tablet (20 mg total) by mouth daily. [name], [name], MD  Active   metoprolol  tartrate (LOPRESSOR ) 25 MG tablet [id number] Yes TAKE ONE-HALF TABLET BY MOUTH  TWICE DAILY [name], [name], MD  Active Self  mometasone  (ELOCON ) 0.1 % cream [id number] Yes Apply 1 Application topically daily as needed (hand rash). Please limit use to 2 Kaoir Loree at a time [name], Rockie, MD  Active   naloxone  (NARCAN ) 4 MG/0.1ML LIQD nasal spray kit [id number] Yes Place 1 spray into the nose as needed (opioid overdose). [provider]  Active Self           Med Note SOILA LYLE JAYSON [place] May 03, 2021 11:23 AM)    omeprazole  (PRILOSEC) 40 MG capsule [id number] Yes Take 1 capsule (40 mg total) by mouth 2 (two) times daily. Therisa Bi, MD  Active Self  ondansetron  (ZOFRAN ) 8 MG tablet [id number] Yes Take 1 tablet (8 mg total) by mouth every 8 (eight) hours as needed for nausea or vomiting. [name], [name], MD  Active   oxyCODONE  (ROXICODONE ) 15 MG immediate release tablet [id number] Yes Take 15 mg by mouth every 4 (four) hours. [provider]  Active Self  Tiotropium Bromide  Monohydrate (SPIRIVA  RESPIMAT) 2.5 MCG/ACT AERS [id number] Yes Take 1 puff by mouth in the morning and at bedtime. Emilio Kelly DASEN, FNP  Active Self  tiZANidine  (ZANAFLEX ) 4 MG tablet [id number]  Take 1 tablet (4 mg total) by mouth every 8 (eight) hours as needed.  Patient not taking: Reported on 01/13/2024   Ulis LYLE, [name]  Active   umeclidinium bromide  (INCRUSE ELLIPTA ) 62.5 MCG/ACT AEPB [id number] Yes Inhale 1 puff into the lungs in the morning. [provider]  Active Self  VENTOLIN  HFA 108 (90 Base) MCG/ACT inhaler [id number]  USE 1 TO 2 INHALATIONS BY MOUTH  INTO THE LUNGS EVERY 6 HOURS AS  NEEDED FOR SHORTNESS OF BREATH  OR WHEEZING [name], [name], MD  Active   zolpidem  (AMBIEN ) 5 MG tablet [id number] Yes Take 1  tablet (5 mg total) by mouth at bedtime as needed for sleep. [name], Rockie, MD  Active Self            Recommendation:   {RECOMMENDATONS:32554}  Follow Up Plan:   {FOLLOWUP:32559}  SIG ***

## 2024-02-11 NOTE — Patient Outreach (Signed)
 Complex Care Management   Visit Note  02/11/2024  Name:  Sara Munoz MRN: [id number] DOB: Jan 21, 1966  Situation: Referral received for Complex Care Management related to COPD and Anxiety and HTN I obtained verbal consent from Patient.  Visit completed with Patient  on the phone  Background:   Past Medical History:  Diagnosis Date   Anemia    Anxiety    Aortic atherosclerosis (HCC)    Asthma    Atypical chest pain 08/02/2015   Carotid artery disease (HCC)    Chronic back pain    Chronic nausea    Chronic, continuous use of opioids 02/27/2016   a.) has naloxone  Rx available   COPD (chronic obstructive pulmonary disease) (HCC)    Coronary artery disease    a.) LHC 2011: no sig CAD (report unavailable); b.) LHC 07/07/2014: 10-20% mLM, 25% LAD, 30-40% LCx, 20-30% RCA -- med mgmt; c.) LHC 02/22/2016: 25% LM, 25% pLCx --> med mgmt; d.) MPI 07/10/2017: no ischemia; e.) MPI 09/01/2019: no ischemia; f.) cCTA 10/28/2022: Ca score 1092 (99th percentile for age/sex/race match control)   Current smoker    DDD (degenerative disc disease), cervical    a.) s/p ACDF C5-C7 and PSIF C5-T1   DDD (degenerative disc disease), lumbar 05/23/2020   a.) s/p L5-S1 PLIF   GERD (gastroesophageal reflux disease)    Hepatic steatosis    Hepatitis C virus infection cured after antiviral drug therapy    a.) s/p Tx with standard glecaprevir-pibrentasvir course   Hypercholesteremia    Hyperkalemia    Hyperlipidemia    Hypertension    Hypokalemia    Hypomagnesemia 01/04/2014   Incomplete tear of left rotator cuff    Insomnia    a.) on hypnotic PRN (zolpidem )   MI (myocardial infarction) (HCC) 2012   Migraines    MSSA (methicillin susceptible Staphylococcus aureus) septicemia (HCC) 03/03/2014   a.) TEE (-) for valvular vegitation   Nerve pain    per patient, in the lower back   Osteoarthritis    Pneumonia    PTSD (post-traumatic stress disorder)    PVD (peripheral vascular disease) (HCC)     Tendinitis of left rotator cuff    TIA (transient ischemic attack) 2014    Assessment: Patient Reported Symptoms:  Cognitive Cognitive Status: No symptoms reported Cognitive/Intellectual Conditions Management [RPT]: None reported or documented in medical history or problem list   Health Maintenance Behaviors: Annual physical exam Healing Pattern: Average Health Facilitated by: Pain control, Prayer/meditation  Neurological Neurological Review of Symptoms: No symptoms reported    HEENT HEENT Symptoms Reported: No symptoms reported      Cardiovascular Cardiovascular Symptoms Reported: No symptoms reported Cardiovascular Management Strategies: Routine screening, Medication therapy Weight: 186 lb (84.4 kg)  Respiratory Respiratory Symptoms Reported: Wheezing, Dry cough Other Respiratory Symptoms: wheeze and cough as per usual - using prn inhaler/neb but no more than usual - aware to notify provider immediately of any worseingin of normal symptoms Additional Respiratory Details: smoking 1/2 pack day - no reduction Respiratory Management Strategies: Routine screening, Medication therapy  Endocrine Endocrine Symptoms Reported: No symptoms reported Is patient diabetic?: No    Gastrointestinal Other Gastrointestinal Symptoms: wiull make appointment for GI - stomach cramping at times, having some heartburn with certain foods Gastrointestinal Management Strategies: Medication therapy    Genitourinary Genitourinary Symptoms Reported: No symptoms reported    Integumentary Additional Integumentary Details: inside thing - irritated from heat - cream helping, right foot breakout and peeling no itch - cream helping  Skin Management Strategies: Medication therapy  Musculoskeletal Additional Musculoskeletal Details: Back and neck pain 7/10 usually - will be trying neck injection but ststes will likely need surgery Musculoskeletal Management Strategies: Adequate rest, Exercise, Medication therapy,  Routine screening Musculoskeletal Self-Management Outcome: 3 (uncertain) Falls in the past year?: Yes Number of falls in past year: 1 or less Was there an injury with Fall?: No Fall Risk Category Calculator: 1 Patient Fall Risk Level: Low Fall Risk Patient at Risk for Falls Due to: History of fall(s), Impaired balance/gait, Impaired mobility, Medication side effect Fall risk Follow up: Falls evaluation completed, Falls prevention discussed  Psychosocial Psychosocial Symptoms Reported: Anxiety - if selected complete GAD, Depression - if selected complete PHQ 2-9 Additional Psychological Details: states mood ok - going to beach and excited, spoke with Dr Lang yesterday and feeling better - aware LCSW available prn Behavioral Management Strategies: Medication therapy   Quality of Family Relationships: stressful    02/11/2024    PHQ2-9 Depression Screening   Little interest or pleasure in doing things Several days  Feeling down, depressed, or hopeless Not at all  PHQ-2 - Total Score 1  Trouble falling or staying asleep, or sleeping too much    Feeling tired or having little energy    Poor appetite or overeating     Feeling bad about yourself - or that you are a failure or have let yourself or your family down    Trouble concentrating on things, such as reading the newspaper or watching television    Moving or speaking so slowly that other people could have noticed.  Or the opposite - being so fidgety or restless that you have been moving around a lot more than usual    Thoughts that you would be better off dead, or hurting yourself in some way    PHQ2-9 Total Score    If you checked off any problems, how difficult have these problems made it for you to do your work, take care of things at home, or get along with other people    Depression Interventions/Treatment      Vitals:   02/11/24 1007  BP: (!) 142/90    Medications Reviewed Today     Reviewed by Devra Lands, RN  (Registered Nurse) on 02/11/24 at 1004  Med List Status: <None>   Medication Order Taking? Sig Documenting Provider Last Dose Status Informant  albuterol  (VENTOLIN  HFA) 108 (90 Base) MCG/ACT inhaler [id number] Yes Inhale 2 puffs into the lungs every 6 (six) hours as needed for wheezing or shortness of breath. Malka Domino, MD  Active   amLODipine  (NORVASC ) 5 MG tablet [id number] Yes TAKE 1 TABLET BY MOUTH EVERYDAY AT BEDTIME [name], [name], MD  Active   aspirin  EC 81 MG tablet [id number] Yes Take 81 mg by mouth in the morning. Swallow whole. [provider]  Active Self  atorvastatin  (LIPITOR) 40 MG tablet [id number] Yes TAKE 1 TABLET BY MOUTH EVERY DAY IN THE KARNA Darliss Rogue, MD  Active   budeson-glycopyrrolate -formoterol  (BREZTRI  AEROSPHERE) 160-9-4.8 MCG/ACT AERO inhaler [id number] Yes Inhale 2 puffs into the lungs in the morning and at bedtime. Malka Domino, MD  Active   clonazePAM  (KLONOPIN ) 2 MG tablet [id number] Yes Take 1 tablet (2 mg total) by mouth 2 (two) times daily. [name], [name], MD  Active   clotrimazole -betamethasone  (LOTRISONE ) cream [id number] Yes Apply 1 Application topically at bedtime. [name], [name], MD  Active   docusate sodium  (COLACE) 100 MG capsule [id number] Yes Take 100 mg by  mouth in the morning. [provider]  Active Self  fenofibrate  (TRICOR ) 48 MG tablet [id number] Yes Take 1 tablet (48 mg total) by mouth daily. [name], [name], MD  Active   gabapentin  (NEURONTIN ) 600 MG tablet [id number] Yes Take 600 mg by mouth 4 (four) times daily. [provider]  Active Self  ipratropium-albuterol  (DUONEB) 0.5-2.5 (3) MG/3ML SOLN [id number] Yes INHALE 3 ML BY NEBULIZER EVERY 6 HOURS AS NEEDED Assaker, Darrin, MD  Active   isosorbide  mononitrate (IMDUR ) 30 MG 24 hr tablet [id number] Yes Take 1 tablet (30 mg total) by mouth daily. Gerard Frederick, NP  Active   lisinopril  (ZESTRIL ) 20 MG tablet  [id number] Yes Take 1 tablet (20 mg total) by mouth daily. [name], [name], MD  Active   metoprolol  tartrate (LOPRESSOR ) 25 MG tablet [id number] Yes TAKE ONE-HALF TABLET BY MOUTH  TWICE DAILY [name], [name], MD  Active Self  mometasone  (ELOCON ) 0.1 % cream [id number] Yes Apply 1 Application topically daily as needed (hand rash). Please limit use to 2 Janina [place] at a time [name], Rockie, MD  Active   naloxone  (NARCAN ) 4 MG/0.1ML LIQD nasal spray kit [id number] Yes Place 1 spray into the nose as needed (opioid overdose). [provider]  Active Self           Med Note SOILA LYLE JAYSON [place] May 03, 2021 11:23 AM)    omeprazole  (PRILOSEC) 40 MG capsule [id number] Yes Take 1 capsule (40 mg total) by mouth 2 (two) times daily. Therisa Bi, MD  Active Self  ondansetron  (ZOFRAN ) 8 MG tablet [id number] Yes Take 1 tablet (8 mg total) by mouth every 8 (eight) hours as needed for nausea or vomiting. [name], [name], MD  Active   oxyCODONE  (ROXICODONE ) 15 MG immediate release tablet [id number] Yes Take 15 mg by mouth every 4 (four) hours. [provider]  Active Self  Tiotropium Bromide  Monohydrate (SPIRIVA  RESPIMAT) 2.5 MCG/ACT AERS [id number] Yes Take 1 puff by mouth in the morning and at bedtime. Emilio Kelly DASEN, FNP  Active Self  tiZANidine  (ZANAFLEX ) 4 MG tablet [id number]  Take 1 tablet (4 mg total) by mouth every 8 (eight) hours as needed.  Patient not taking: Reported on 01/13/2024   Ulis LYLE, [name]  Active   umeclidinium bromide  (INCRUSE ELLIPTA ) 62.5 MCG/ACT AEPB [id number] Yes Inhale 1 puff into the lungs in the morning. [provider]  Active Self  VENTOLIN  HFA 108 (90 Base) MCG/ACT inhaler [id number]  USE 1 TO 2 INHALATIONS BY MOUTH  INTO THE LUNGS EVERY 6 HOURS AS  NEEDED FOR SHORTNESS OF BREATH  OR WHEEZING [name], [name], MD  Active   zolpidem  (AMBIEN ) 5 MG tablet [id number] Yes Take 1 tablet (5 mg total) by mouth at bedtime as  needed for sleep. [name], Rockie, MD  Active Self            Recommendation:   PCP Follow-up Continue Current Plan of Care  Follow Up Plan:   Telephone follow-up in 1 month  Nestora Duos, MSN, RN [facility]  [facility], [facility] RN Care Manager Direct Dial: [phone] Fax: [phone]

## 2024-02-11 NOTE — Patient Instructions (Signed)
 Visit Information  Thank you for taking time to visit with me today. Please don't hesitate to contact me if I can be of assistance to you before our next scheduled appointment.  Your next care management appointment is by telephone on 03/10/2024 at 10:00 am  Telephone follow-up in 1 month  Please call the care guide team at [phone] if you need to cancel, schedule, or reschedule an appointment.   Please call the Suicide and Crisis Lifeline: 988 call the [place]  National Suicide Prevention Lifeline: (276) 337-5684 or TTY: 224-196-4595 TTY 325-171-3191) to talk to a trained counselor call 1-800-273-TALK (toll free, 24 hour hotline) go to [facility] [address], [place] [phone]) call 911 if you are experiencing a Mental Health or Behavioral Health Crisis or need someone to talk to.  Nestora Duos, MSN, RN [place]  [facility], [facility] RN Care Manager Direct Dial: [phone] Fax: [phone]    Managing Your Hypertension Hypertension, also called high blood pressure, is when the force of the blood pressing against the walls of the arteries is too strong. Arteries are blood vessels that carry blood from your heart throughout your body. Hypertension forces the heart to work harder to pump blood and may cause the arteries to become narrow or stiff. Understanding blood pressure readings A blood pressure reading includes a higher number over a lower number: The first, or top, number is called the systolic pressure. It is a measure of the pressure in your arteries as your heart beats. The second, or bottom number, is called the diastolic pressure. It is a measure of the pressure in your arteries as the heart relaxes. For most people, a normal blood pressure is below 120/80. Your personal target blood pressure may vary depending on your medical conditions, your age, and other factors. Blood pressure is classified  into four stages. Based on your blood pressure reading, your health care provider may use the following stages to determine what type of treatment you need, if any. Systolic pressure and diastolic pressure are measured in a unit called millimeters of mercury (mmHg). Normal Systolic pressure: below 120. Diastolic pressure: below 80. Elevated Systolic pressure: 120-129. Diastolic pressure: below 80. Hypertension stage 1 Systolic pressure: 130-139. Diastolic pressure: 80-89. Hypertension stage 2 Systolic pressure: 140 or above. Diastolic pressure: 90 or above. How can this condition affect me? Managing your hypertension is very important. Over time, hypertension can damage the arteries and decrease blood flow to parts of the body, including the brain, heart, and kidneys. Having untreated or uncontrolled hypertension can lead to: A heart attack. A stroke. A weakened blood vessel (aneurysm). Heart failure. Kidney damage. Eye damage. Memory and concentration problems. Vascular dementia. What actions can I take to manage this condition? Hypertension can be managed by making lifestyle changes and possibly by taking medicines. Your health care provider will help you make a plan to bring your blood pressure within a normal range. You may be referred for counseling on a healthy diet and physical activity. Nutrition  Eat a diet that is high in fiber and potassium, and low in salt (sodium), added sugar, and fat. An example eating plan is called the DASH diet. DASH stands for Dietary Approaches to Stop Hypertension. To eat this way: Eat plenty of fresh fruits and vegetables. Try to fill one-half of your plate at each meal with fruits and vegetables. Eat whole grains, such as whole-wheat pasta, brown rice, or whole-grain bread. Fill about one-fourth of your plate with  whole grains. Eat low-fat dairy products. Avoid fatty cuts of meat, processed or cured meats, and poultry with skin. Fill about  one-fourth of your plate with lean proteins such as fish, chicken without skin, beans, eggs, and tofu. Avoid pre-made and processed foods. These tend to be higher in sodium, added sugar, and fat. Reduce your daily sodium intake. Many people with hypertension should eat less than 1,500 mg of sodium a day. Lifestyle  Work with your health care provider to maintain a healthy body weight or to lose weight. Ask what an ideal weight is for you. Get at least 30 minutes of exercise that causes your heart to beat faster (aerobic exercise) most days of the week. Activities may include walking, swimming, or biking. Include exercise to strengthen your muscles (resistance exercise), such as weight lifting, as part of your weekly exercise routine. Try to do these types of exercises for 30 minutes at least 3 days a week. Do not use any products that contain nicotine  or tobacco. These products include cigarettes, chewing tobacco, and vaping devices, such as e-cigarettes. If you need help quitting, ask your health care provider. Control any long-term (chronic) conditions you have, such as high cholesterol or diabetes. Identify your sources of stress and find ways to manage stress. This may include meditation, deep breathing, or making time for fun activities. Alcohol  use Do not drink alcohol  if: Your health care provider tells you not to drink. You are pregnant, may be pregnant, or are planning to become pregnant. If you drink alcohol : Limit how much you have to: 0-1 drink a day for women. 0-2 drinks a day for men. Know how much alcohol  is in your drink. In the [place] one drink equals one 12 oz bottle of beer (355 mL), one 5 oz glass of wine (148 mL), or one 1 oz glass of hard liquor (44 mL). Medicines Your health care provider may prescribe medicine if lifestyle changes are not enough to get your blood pressure under control and if: Your systolic blood pressure is 130 or higher. Your diastolic blood pressure  is 80 or higher. Take medicines only as told by your health care provider. Follow the directions carefully. Blood pressure medicines must be taken as told by your health care provider. The medicine does not work as well when you skip doses. Skipping doses also puts you at risk for problems. Monitoring Before you monitor your blood pressure: Do not smoke, drink caffeinated beverages, or exercise within 30 minutes before taking a measurement. Use the bathroom and empty your bladder (urinate). Sit quietly for at least 5 minutes before taking measurements. Monitor your blood pressure at home as told by your health care provider. To do this: Sit with your back straight and supported. Place your feet flat on the floor. Do not cross your legs. Support your arm on a flat surface, such as a table. Make sure your upper arm is at heart level. Each time you measure, take two or three readings one minute apart and record the results. You may also need to have your blood pressure checked regularly by your health care provider. General information Talk with your health care provider about your diet, exercise habits, and other lifestyle factors that may be contributing to hypertension. Review all the medicines you take with your health care provider because there may be side effects or interactions. Keep all follow-up visits. Your health care provider can help you create and adjust your plan for managing your high blood pressure. Where  to find more information National Heart, Lung, and Blood Institute: [employer].is American Heart Association: www.heart.org Contact a health care provider if: You think you are having a reaction to medicines you have taken. You have repeated (recurrent) headaches. You feel dizzy. You have swelling in your ankles. You have trouble with your vision. Get help right away if: You develop a severe headache or confusion. You have unusual weakness or numbness, or you feel  faint. You have severe pain in your chest or abdomen. You vomit repeatedly. You have trouble breathing. These symptoms may be an emergency. Get help right away. Call 911. Do not wait to see if the symptoms will go away. Do not drive yourself to the hospital. Summary Hypertension is when the force of blood pumping through your arteries is too strong. If this condition is not controlled, it may put you at risk for serious complications. Your personal target blood pressure may vary depending on your medical conditions, your age, and other factors. For most people, a normal blood pressure is less than 120/80. Hypertension is managed by lifestyle changes, medicines, or both. Lifestyle changes to help manage hypertension include losing weight, eating a healthy, low-sodium diet, exercising more, stopping smoking, and limiting alcohol . This information is not intended to replace advice given to you by your health care provider. Make sure you discuss any questions you have with your health care provider. Document Revised: 02/22/2021 Document Reviewed: 02/22/2021 Elsevier Patient Education  2024 [employer].

## 2024-02-12 ENCOUNTER — Other Ambulatory Visit: Payer: Self-pay | Admitting: Physician Assistant

## 2024-02-19 DIAGNOSIS — M79622 Pain in left upper arm: Secondary | ICD-10-CM | POA: Diagnosis not present

## 2024-02-19 DIAGNOSIS — M545 Low back pain, unspecified: Secondary | ICD-10-CM | POA: Diagnosis not present

## 2024-02-19 DIAGNOSIS — M25511 Pain in right shoulder: Secondary | ICD-10-CM | POA: Diagnosis not present

## 2024-03-01 ENCOUNTER — Ambulatory Visit (INDEPENDENT_AMBULATORY_CARE_PROVIDER_SITE_OTHER): Admitting: Family Medicine

## 2024-03-01 ENCOUNTER — Other Ambulatory Visit: Payer: Self-pay | Admitting: Family Medicine

## 2024-03-01 ENCOUNTER — Encounter: Payer: Self-pay | Admitting: Family Medicine

## 2024-03-01 ENCOUNTER — Ambulatory Visit: Payer: Self-pay

## 2024-03-01 ENCOUNTER — Telehealth: Payer: Self-pay

## 2024-03-01 VITALS — BP 148/83 | HR 58 | Temp 97.9°F | Ht 64.0 in | Wt 184.0 lb

## 2024-03-01 DIAGNOSIS — L0291 Cutaneous abscess, unspecified: Secondary | ICD-10-CM

## 2024-03-01 MED ORDER — PREDNISONE 20 MG PO TABS: 20.0000 mg | ORAL_TABLET | Freq: Every day | ORAL | 0 refills | Status: DC

## 2024-03-01 MED ORDER — DOXYCYCLINE HYCLATE 100 MG PO TABS: 100.0000 mg | ORAL_TABLET | Freq: Two times a day (BID) | ORAL | 0 refills | Status: AC

## 2024-03-01 MED ORDER — PREDNISONE 20 MG PO TABS: 40.0000 mg | ORAL_TABLET | Freq: Every day | ORAL | 0 refills | Status: AC

## 2024-03-01 NOTE — Telephone Encounter (Signed)
 Rx updated in today's visit encounter

## 2024-03-01 NOTE — Progress Notes (Signed)
 ACUTE VISIT   Patient: Sara Munoz   DOB: 1965-06-30   58 y.o. Female  MRN: [id number]   PCP: Sharma Coyer, MD  Chief Complaint  Patient presents with   Cyst    Patient presents with boil x 1 week, reports it started as small bump- states she touched/ squeezed it and it has grown in size and is sore. Reports white/ blood tinges pus from wound  No chills/ body aches, may have had fever- states she will wake up in a sweat at nighttime. Some loss of appetite     Subjective    HPI HPI     Cyst    Additional comments: Patient presents with boil x 1 week, reports it started as small bump- states she touched/ squeezed it and it has grown in size and is sore. Reports white/ blood tinges pus from wound  No chills/ body aches, may have had fever- states she will wake up in a sweat at nighttime. Some loss of appetite        Last edited by Cherry Chiquita HERO, CMA on 03/01/2024  2:01 PM.       Discussed the use of AI scribe software for clinical note transcription with the patient, who gave verbal consent to proceed.  History of Present Illness Sara Munoz is a 58 year old female who presents with a boil on her lower abdomen.  She noticed the boil on her lower abdomen about a week ago. It initially grew to a size larger than her hand and has been draining. She describes it as feeling like an abscess and notes significant soreness when pressure is applied.  No previous occurrences in the same location and she is unsure of any specific cause, such as a scratch or swimming, that might have led to its development.  She experiences nausea, which she describes as an ongoing issue, but notes it has worsened recently. She is currently taking Zofran  8 mg as needed for nausea.  She has a history of severe infections leading to sepsis, expressing concern about the current boil potentially leading to a similar situation.     Medications: Outpatient Medications Prior to  Visit  Medication Sig   albuterol  (VENTOLIN  HFA) 108 (90 Base) MCG/ACT inhaler Inhale 2 puffs into the lungs every 6 (six) hours as needed for wheezing or shortness of breath.   amLODipine  (NORVASC ) 5 MG tablet TAKE 1 TABLET BY MOUTH EVERYDAY AT BEDTIME   aspirin  EC 81 MG tablet Take 81 mg by mouth in the morning. Swallow whole.   atorvastatin  (LIPITOR) 40 MG tablet TAKE 1 TABLET BY MOUTH EVERY DAY IN THE EVENING   budeson-glycopyrrolate -formoterol  (BREZTRI  AEROSPHERE) 160-9-4.8 MCG/ACT AERO inhaler Inhale 2 puffs into the lungs in the morning and at bedtime.   clonazePAM  (KLONOPIN ) 2 MG tablet Take 1 tablet (2 mg total) by mouth 2 (two) times daily.   clotrimazole -betamethasone  (LOTRISONE ) cream Apply 1 Application topically at bedtime.   docusate sodium  (COLACE) 100 MG capsule Take 100 mg by mouth in the morning.   fenofibrate  (TRICOR ) 48 MG tablet Take 1 tablet (48 mg total) by mouth daily.   gabapentin  (NEURONTIN ) 600 MG tablet Take 600 mg by mouth 4 (four) times daily.   ipratropium-albuterol  (DUONEB) 0.5-2.5 (3) MG/3ML SOLN INHALE 3 ML BY NEBULIZER EVERY 6 HOURS AS NEEDED   isosorbide  mononitrate (IMDUR ) 30 MG 24 hr tablet Take 1 tablet (30 mg total) by mouth daily.   lisinopril  (ZESTRIL ) 20  MG tablet Take 1 tablet (20 mg total) by mouth daily.   metoprolol  tartrate (LOPRESSOR ) 25 MG tablet TAKE ONE-HALF TABLET BY MOUTH  TWICE DAILY   mometasone  (ELOCON ) 0.1 % cream Apply 1 Application topically daily as needed (hand rash). Please limit use to 2 weeks at a time   naloxone  (NARCAN ) 4 MG/0.1ML LIQD nasal spray kit Place 1 spray into the nose as needed (opioid overdose).   omeprazole  (PRILOSEC) 40 MG capsule Take 1 capsule (40 mg total) by mouth 2 (two) times daily.   ondansetron  (ZOFRAN ) 8 MG tablet Take 1 tablet (8 mg total) by mouth every 8 (eight) hours as needed for nausea or vomiting.   oxyCODONE  (ROXICODONE ) 15 MG immediate release tablet Take 15 mg by mouth every 4 (four) hours.    Tiotropium Bromide  Monohydrate (SPIRIVA  RESPIMAT) 2.5 MCG/ACT AERS Take 1 puff by mouth in the morning and at bedtime.   tiZANidine  (ZANAFLEX ) 4 MG tablet TAKE 1 TABLET BY MOUTH EVERY 8 HOURS AS NEEDED   umeclidinium bromide  (INCRUSE ELLIPTA ) 62.5 MCG/ACT AEPB Inhale 1 puff into the lungs in the morning.   VENTOLIN  HFA 108 (90 Base) MCG/ACT inhaler USE 1 TO 2 INHALATIONS BY MOUTH  INTO THE LUNGS EVERY 6 HOURS AS  NEEDED FOR SHORTNESS OF BREATH  OR WHEEZING   zolpidem  (AMBIEN ) 5 MG tablet Take 1 tablet (5 mg total) by mouth at bedtime as needed for sleep.   No facility-administered medications prior to visit.    Last metabolic panel Lab Results  Component Value Date   GLUCOSE 72 02/10/2024   NA 139 02/10/2024   K 4.9 02/10/2024   CL 101 02/10/2024   CO2 22 02/10/2024   BUN 13 02/10/2024   CREATININE 0.92 02/10/2024   EGFR 73 02/10/2024   CALCIUM  9.7 02/10/2024   PHOS 2.7 09/16/2023   PROT 7.1 09/21/2023   ALBUMIN  3.8 09/21/2023   LABGLOB 2.5 01/06/2023   AGRATIO 1.6 04/03/2022   BILITOT <0.1 09/21/2023   ALKPHOS 68 09/21/2023   AST 18 09/21/2023   ALT 12 09/21/2023   ANIONGAP 10 09/21/2023   Last lipids Lab Results  Component Value Date   CHOL 174 02/10/2024   HDL 38 (L) 02/10/2024   LDLCALC 105 (H) 02/10/2024   TRIG 179 (H) 02/10/2024   CHOLHDL 4.6 (H) 02/10/2024   Last hemoglobin A1c Lab Results  Component Value Date   HGBA1C 6.0 (H) 01/06/2023        Objective    BP (!) 148/83 (BP Location: Right Arm, Patient Position: Sitting, Cuff Size: Normal)   Pulse (!) 58   Temp 97.9 F (36.6 C) (Oral)   Ht 5' 4 (1.626 m)   Wt 184 lb (83.5 kg)   LMP 12/16/2017 (Approximate)   SpO2 100%   BMI 31.58 kg/m  BP Readings from Last 3 Encounters:  03/01/24 (!) 148/83  02/11/24 (!) 142/90  02/10/24 (!) 147/87   Wt Readings from Last 3 Encounters:  03/01/24 184 lb (83.5 kg)  02/11/24 186 lb (84.4 kg)  02/10/24 186 lb 12.8 oz (84.7 kg)      Physical Exam    Physical Exam ABDOMEN: Central lower abdominal induration with drainage visible through bandage. Indurated area approximately half the size of a golf ball.   No results found for any visits on 03/01/24.  Assessment & Plan     Assessment and Plan Assessment & Plan Cutaneous abscess of lower abdominal wall Cutaneous abscess on the lower abdominal wall, present for one  week. The abscess is firm, draining, and approximately half the size of a golf ball. There is concern for potential systemic infection if not treated. She has severe infections leading to sepsis, which heightens the concern for early intervention. - Prescribe doxycycline  100 mg twice daily for 7 days - Advise application of heat to the area to promote drainage - Instruct to monitor for signs of spreading infection, such as increased redness or size, and seek emergency care if symptoms worsen - Consider incision and drainage if no improvement - Discuss potential use of prednisone  40 mg after 3 days if no improvement in symptoms  Nausea Worsened nausea, which is an ongoing issue for her. She is currently using Zofran  8 mg as needed. There is a consideration of using prednisone  to help with general malaise and potentially with nausea, but steroids may worsen the abscess. - Continue Zofran  8 mg as needed - Consider prednisone  40 mg after 3 days if no improvement in symptoms      No follow-ups on file.        Rockie Agent, MD  [facility] [phone] (phone) [phone] (fax)  [facility]

## 2024-03-01 NOTE — Telephone Encounter (Signed)
 Patient call note and symptoms reviewed. Agree with scheduled appt. Will evaluate during OV

## 2024-03-01 NOTE — Addendum Note (Signed)
 Addended by: [name], Anirudh Baiz L on: 03/01/2024 05:04 PM   Modules accepted: Orders

## 2024-03-01 NOTE — Telephone Encounter (Signed)
 Pharmacy needed to be clear that you want her to take the Prednisone  1 daily for 5 days.  You wrote for 10 pills.  Please send in new prescription with directions to match or explanation.  Thanks         Copied from CRM [id number]. Topic: Clinical - Prescription Issue >> Mar 01, 2024  2:24 PM Sara Munoz wrote: Reason for CRM: Pt is calling b/c [facility] needs clarification on doxycycline  (VIBRA -TABS) 100 MG tablet and predniSONE  (DELTASONE ) 20 MG tablet. Please reach out to the pharmacy   Pharmacy [facility] # [phone] [place] [address] Phone: [phone] Fax: [phone]

## 2024-03-01 NOTE — Telephone Encounter (Signed)
 FYI Only or Action Required?: FYI only for provider.  Patient was last seen in primary care on 02/10/2024 by Sharma Coyer, MD.  Called Nurse Triage reporting Nausea and Night Sweats.  Symptoms began a week ago.  Interventions attempted: OTC medications: tylenol .  Symptoms are: gradually worsening.  Triage Disposition: See HCP Within 4 Hours (Or PCP Triage)  Patient/caregiver understands and will follow disposition?: Yes     Copied from CRM #[id number]. Topic: Clinical - Red Word Triage >> Mar 01, 2024  9:25 AM Jasmin G wrote: Red Word that prompted transfer to Nurse Triage: Pt believes she has a nodule or a cyst at the bottom of her stomach as big as her hand underneath her skin with lots of infection, pt states that it's starting to interfere with her being able to eat, she has also been feeling nauseous and has been experiencing night sweats. Reason for Disposition  Red streak from area of infection  Answer Assessment - Initial Assessment Questions 1. APPEARANCE of BOIL: What does the boil look like?      A knot as big as my fist on abd - pus/drainage 2. LOCATION: Where is the boil located?      abd 3. NUMBER: How many boils are there?      x1 4. SIZE: How big is the boil? (e.g., inches, cm; compare to size of a coin or other object)     Size of fist 5. ONSET: When did the boil start?     X 1 week 6. PAIN: Is there any pain? If Yes, ask: How bad is the pain?   (Scale 1-10; or mild, moderate, severe)     7/10 Tylenol  with minimal relief 7. FEVER: Do you have a fever? If Yes, ask: What is it, how was it measured, and when did it start?      Endorses night sweats 8. SOURCE: Have you been around anyone with boils or other Staph infections? Have you ever had boils before?     unknown 9. OTHER SYMPTOMS: Do you have any other symptoms? (e.g., shaking chills, weakness, rash elsewhere on body)     Nausea, fever blisters on bottom lip, night  sweats 10. PREGNANCY: Is there any chance you are pregnant? When was your last menstrual period?       N/a  Protocols used: Boil (Skin Abscess)-A-AH

## 2024-03-08 ENCOUNTER — Ambulatory Visit: Admitting: Pulmonary Disease

## 2024-03-10 ENCOUNTER — Other Ambulatory Visit: Payer: Self-pay

## 2024-03-10 NOTE — Patient Instructions (Signed)
 Visit Information  Thank you for taking time to visit with me today. Please don't hesitate to contact me if I can be of assistance to you before our next scheduled appointment.  Your next care management appointment is by telephone on 04/07/2024 at 10:00 am  Telephone follow-up in 1 month  Please call the care guide team at [phone] if you need to cancel, schedule, or reschedule an appointment.   Please call the Suicide and Crisis Lifeline: 988 call the [place]  National Suicide Prevention Lifeline: 843-069-6759 or TTY: 307-096-2200 TTY 838-726-6849) to talk to a trained counselor call 1-800-273-TALK (toll free, 24 hour hotline) go to [facility] [address] [place] [phone]) call 911 if you are experiencing a Mental Health or Behavioral Health Crisis or need someone to talk to.  Nestora Duos, MSN, RN [place]  [facility], [facility] RN Care Manager Direct Dial: [phone] Fax: [phone]  COPD Action Plan A COPD action plan is a description of what to do when you have a flare (exacerbation) of chronic obstructive pulmonary disease (COPD). Your action plan is a color-coded plan that lists the symptoms that indicate whether your condition is under control and what actions to take. If you have symptoms in the green zone, it means you are doing well that day. If you have symptoms in the yellow zone, it means you are having a bad day or an exacerbation. If you have symptoms in the red zone, you need urgent medical care. Follow the plan that you and your health care provider developed. Review your plan with your health care provider at each visit. Red zone Symptoms in this zone mean that you should get medical help right away. They include: Feeling very short of breath, even when you are resting. Not being able to do any activities because of poor breathing. Not being able to sleep because of poor  breathing. Fever or shaking chills. Feeling confused or very sleepy. Chest pain. Coughing up blood. If you have any of these symptoms, call emergency services (911 in the [place] or go to the nearest emergency room. Yellow zone Symptoms in this zone mean that your condition may be getting worse. They include: Feeling more short of breath than usual. Having less energy for daily activities than usual. Phlegm or mucus that is thicker than usual. Needing to use your rescue inhaler or nebulizer more often than usual. More ankle swelling than usual. Coughing more than usual. Feeling like you have a chest cold. Trouble sleeping due to COPD symptoms. Decreased appetite. COPD medicines not helping as much as usual. If you experience any yellow symptoms: Keep taking your daily medicines as directed. Use your quick-relief inhaler as told by your health care provider. If you were prescribed steroid medicine to take by mouth (oral medicine), start taking it as told by your health care provider. If you were prescribed an antibiotic medicine, start taking it as told by your health care provider. Do not stop taking the antibiotic even if you start to feel better. Use oxygen  as told by your health care provider. Get more rest. Do your pursed-lip breathing exercises. Do not smoke. Avoid any irritants in the air. If your signs and symptoms do not improve after taking these steps, call your health care provider right away. Green zone Symptoms in this zone mean that you are doing well. They include: Being able to do your usual activities and exercise. Having the usual amount of coughing, including the same  amount of phlegm or mucus. Being able to sleep well. Having a good appetite. Where to find more information: You can find more information about COPD from: American Lung Association, My COPD Action Plan: www.lung.org COPD Foundation: www.copdfoundation.org National Heart, Lung, & Blood Institute:  [employer] Follow these instructions at home: Continue taking your daily medicines as told by your health care provider. Make sure you receive all the immunizations that your health care provider recommends, especially the pneumococcal and influenza vaccines. Wash your hands often with soap and water . Have family members wash their hands too. Regular hand washing can help prevent infections. Follow your usual exercise and diet plan. Avoid irritants in the air, such as smoke. Do not use any products that contain nicotine  or tobacco. These products include cigarettes, chewing tobacco, and vaping devices, such as e-cigarettes. If you need help quitting, ask your health care provider. Summary A COPD action plan tells you what to do when you have a flare (exacerbation) of chronic obstructive pulmonary disease (COPD). Follow each action plan for your symptoms. If you have any symptoms in the red zone, call emergency services (911 in the [place] or go to the nearest emergency room. This information is not intended to replace advice given to you by your health care provider. Make sure you discuss any questions you have with your health care provider. Document Revised: 04/24/2023 Document Reviewed: 04/24/2023 Elsevier Patient Education  2024 Elsevier Inc.  Skin Abscess  A skin abscess is an infected spot of skin. It can have pus in it. An abscess can happen in any part of your body. Some abscesses break open (rupture) on their own. Most keep getting worse unless they are treated. If your abscess is not treated, the infection can spread deeper into your body and blood. This can make you feel sick. What are the causes? Germs that enter your skin. This may happen if you have: A cut or scrape. A wound from a needle or an insect bite. Blocked oil or sweat glands. A problem with the spot where your hair goes into your skin. A fluid-filled sac called a cyst under your skin. What increases the  risk? Having problems with how your blood moves through your body. Having a weak body defense system (immune system). Having diabetes. Having dry and irritated skin. Needing to get shots often. Putting drugs into your body with a needle. Having a splinter or something else in your skin. Smoking. What are the signs or symptoms? A firm bump under your skin that hurts. A bump with pus at the top. Redness and swelling. Warm or tender spots. A sore on the skin. How is this treated? You may need to: Put a heat pack or a warm, wet washcloth on the spot. Have the pus drained. Take antibiotics. Follow these instructions at home: Medicines Take over-the-counter and prescription medicines only as told by your doctor. If you were prescribed antibiotics, take them as told by your doctor. Do not stop taking them even if you start to feel better. Abscess care  If you have an abscess that has not drained, put heat on it. Use the heat source that your doctor recommends, such as a moist heat pack or a heating pad. Place a towel between your skin and the heat source. Leave the heat on for 20-30 minutes. If your skin turns bright red, take off the heat right away to prevent burns. The risk of burns is higher if you cannot feel pain, heat, or cold. Follow  instructions from your doctor about how to take care of your abscess. Make sure you: Cover the abscess with a bandage. Wash your hands with soap and water  for at least 20 seconds before and after you change your bandage. If you cannot use soap and water , use hand sanitizer. Change your bandage as told by your doctor. Check your abscess every day for signs that the infection is getting worse. Check for: More redness, swelling, or pain. More fluid or blood. Warmth. More pus or a worse smell. General instructions To keep the infection from spreading: Do not share personal items or towels. Do not go in a hot tub with others. Avoid making skin  contact with others. Be careful when you get rid of used bandages or any pus from the abscess. Do not smoke or use any products that contain nicotine  or tobacco. If you need help quitting, ask your doctor. Contact a doctor if: You see red streaks on your skin near the abscess. You have any signs of worse infection. You vomit every time you eat or drink. You have a fever, chills, or muscle aches. The cyst or abscess comes back. Get help right away if: You have very bad pain. You make less pee (urine) than normal. This information is not intended to replace advice given to you by your health care provider. Make sure you discuss any questions you have with your health care provider. Document Revised: 01/23/2022 Document Reviewed: 01/23/2022 Elsevier Patient Education  2024 [employer].

## 2024-03-10 NOTE — Patient Outreach (Signed)
 Complex Care Management   Visit Note  03/10/2024  Name:  Sara Munoz MRN: [id number] DOB: 01/30/1966  Situation: Referral received for Complex Care Management related to COPD and HTN, Anxiety I obtained verbal consent from Patient.  Visit completed with Patient  on the phone  Background:   Past Medical History:  Diagnosis Date   Anemia    Anxiety    Aortic atherosclerosis (HCC)    Asthma    Atypical chest pain 08/02/2015   Carotid artery disease (HCC)    Chronic back pain    Chronic nausea    Chronic, continuous use of opioids 02/27/2016   a.) has naloxone  Rx available   COPD (chronic obstructive pulmonary disease) (HCC)    Coronary artery disease    a.) LHC 2011: no sig CAD (report unavailable); b.) LHC 07/07/2014: 10-20% mLM, 25% LAD, 30-40% LCx, 20-30% RCA -- med mgmt; c.) LHC 02/22/2016: 25% LM, 25% pLCx --> med mgmt; d.) MPI 07/10/2017: no ischemia; e.) MPI 09/01/2019: no ischemia; f.) cCTA 10/28/2022: Ca score 1092 (99th percentile for age/sex/race match control)   Current smoker    DDD (degenerative disc disease), cervical    a.) s/p ACDF C5-C7 and PSIF C5-T1   DDD (degenerative disc disease), lumbar 05/23/2020   a.) s/p L5-S1 PLIF   GERD (gastroesophageal reflux disease)    Hepatic steatosis    Hepatitis C virus infection cured after antiviral drug therapy    a.) s/p Tx with standard glecaprevir-pibrentasvir course   Hypercholesteremia    Hyperkalemia    Hyperlipidemia    Hypertension    Hypokalemia    Hypomagnesemia 01/04/2014   Incomplete tear of left rotator cuff    Insomnia    a.) on hypnotic PRN (zolpidem )   MI (myocardial infarction) (HCC) 2012   Migraines    MSSA (methicillin susceptible Staphylococcus aureus) septicemia (HCC) 03/03/2014   a.) TEE (-) for valvular vegitation   Nerve pain    per patient, in the lower back   Osteoarthritis    Pneumonia    PTSD (post-traumatic stress disorder)    PVD (peripheral vascular disease) (HCC)    Tendinitis  of left rotator cuff    TIA (transient ischemic attack) 2014    Assessment: Patient Reported Symptoms:  Cognitive Cognitive Status: Alert and oriented to person, place, and time, Difficulties with attention and concentration, Normal speech and language skills (always concentration issues) Cognitive/Intellectual Conditions Management [RPT]: None reported or documented in medical history or problem list   Health Maintenance Behaviors: Annual physical exam  Neurological Neurological Review of Symptoms: Headaches, Weakness Neurological Comment: chronic headaches and weakness - states will make fu with neuro  HEENT HEENT Symptoms Reported: No symptoms reported HEENT Management Strategies: Routine screening    Cardiovascular Cardiovascular Symptoms Reported: No symptoms reported Cardiovascular Management Strategies: Routine screening  Respiratory Other Respiratory Symptoms: wheeze and cough - productive at times yellow sputum, declines to make PCP appointment, reports using nebs QOD - advised to use at least daily, advised again to get batteries for pulse ox, discussed when to cal lPCP vs red flags for ED - COPD info sent again Additional Respiratory Details: stressed - increased smoking to almost  pack a day, encouraged to do breathing exercises and incentive spirometer Respiratory Management Strategies: Medication therapy, Routine screening, Breathing exercise  Endocrine Endocrine Symptoms Reported: Not assessed Is patient diabetic?: No    Gastrointestinal Gastrointestinal Symptoms Reported: Nausea Other Gastrointestinal Symptoms: zofan helping nausea - states will reschedule GI Gastrointestinal Management Strategies: Medication  therapy    Genitourinary Genitourinary Symptoms Reported: No symptoms reported    Integumentary Additional Integumentary Details: abdominal abscess right side doxycycline  completed, reports size decreased not as red, not draining, advised when to call PCP and red  flags for ED, sent info via MyChart    Musculoskeletal Musculoskelatal Symptoms Reviewed: Back pain, Difficulty walking, Joint pain, Muscle pain, Unsteady gait, Weakness Additional Musculoskeletal Details: for cervical injection tomorrow, neck pain 8/10, knee pain 5-6/10 - seeing ortho, back pain 8/10, cane prn, no recent falls Musculoskeletal Management Strategies: Medication therapy, Routine screening Falls in the past year?: Yes Number of falls in past year: 2 or more Was there an injury with Fall?: No Fall Risk Category Calculator: 2 Patient Fall Risk Level: Moderate Fall Risk Patient at Risk for Falls Due to: History of fall(s), Impaired balance/gait, Impaired mobility, Orthopedic patient, Medication side effect Fall risk Follow up: Falls evaluation completed, Falls prevention discussed, Education provided  Psychosocial Psychosocial Symptoms Reported: Anxiety - if selected complete GAD Additional Psychological Details: stress and anxiety - increased smoking, declines counseling Behavioral Management Strategies: Medication therapy Major Change/Loss/Stressor/Fears (CP): Medical condition, self      03/10/2024    PHQ2-9 Depression Screening   Little interest or pleasure in doing things Several days  Feeling down, depressed, or hopeless Not at all  PHQ-2 - Total Score 1  Trouble falling or staying asleep, or sleeping too much    Feeling tired or having little energy    Poor appetite or overeating     Feeling bad about yourself - or that you are a failure or have let yourself or your family down    Trouble concentrating on things, such as reading the newspaper or watching television    Moving or speaking so slowly that other people could have noticed.  Or the opposite - being so fidgety or restless that you have been moving around a lot more than usual    Thoughts that you would be better off dead, or hurting yourself in some way    PHQ2-9 Total Score    If you checked off any  problems, how difficult have these problems made it for you to do your work, take care of things at home, or get along with other people    Depression Interventions/Treatment      Vitals:   03/10/24 1017  BP: (!) 117/90    Medications Reviewed Today     Reviewed by Devra Lands, RN (Registered Nurse) on 03/10/24 at 1010  Med List Status: <None>   Medication Order Taking? Sig Documenting Provider Last Dose Status Informant  albuterol  (VENTOLIN  HFA) 108 (90 Base) MCG/ACT inhaler [id number] Yes Inhale 2 puffs into the lungs every 6 (six) hours as needed for wheezing or shortness of breath. Malka Domino, MD  Active   amLODipine  (NORVASC ) 5 MG tablet [id number] Yes TAKE 1 TABLET BY MOUTH EVERYDAY AT BEDTIME [name], [name], MD  Active   aspirin  EC 81 MG tablet [id number] Yes Take 81 mg by mouth in the morning. Swallow whole. [provider]  Active Self  atorvastatin  (LIPITOR) 40 MG tablet [id number] Yes TAKE 1 TABLET BY MOUTH EVERY DAY IN THE KARNA Darliss Rogue, MD  Active   budeson-glycopyrrolate -formoterol  (BREZTRI  AEROSPHERE) 160-9-4.8 MCG/ACT AERO inhaler [id number] Yes Inhale 2 puffs into the lungs in the morning and at bedtime. Malka Domino, MD  Active   clonazePAM  (KLONOPIN ) 2 MG tablet [id number] Yes Take 1 tablet (2 mg total) by mouth 2 (two) times daily. [name],  Rockie, MD  Active   clotrimazole -betamethasone  (LOTRISONE ) cream [id number] Yes Apply 1 Application topically at bedtime. [name], [name], MD  Active   docusate sodium  (COLACE) 100 MG capsule [id number] Yes Take 100 mg by mouth in the morning. [provider]  Active Self  fenofibrate  (TRICOR ) 48 MG tablet [id number] Yes Take 1 tablet (48 mg total) by mouth daily. [name], [name], MD  Active   gabapentin  (NEURONTIN ) 600 MG tablet [id number] Yes Take 600 mg by mouth 4 (four) times daily. [provider]  Active Self  ipratropium-albuterol   (DUONEB) 0.5-2.5 (3) MG/3ML SOLN [id number] Yes INHALE 3 ML BY NEBULIZER EVERY 6 HOURS AS NEEDED Assaker, Darrin, MD  Active   isosorbide  mononitrate (IMDUR ) 30 MG 24 hr tablet [id number] Yes Take 1 tablet (30 mg total) by mouth daily. Gerard Frederick, NP  Active   lisinopril  (ZESTRIL ) 20 MG tablet [id number] Yes Take 1 tablet (20 mg total) by mouth daily. [name], [name], MD  Active   metoprolol  tartrate (LOPRESSOR ) 25 MG tablet [id number] Yes TAKE ONE-HALF TABLET BY MOUTH  TWICE DAILY [name], [name], MD  Active Self  mometasone  (ELOCON ) 0.1 % cream [id number] Yes Apply 1 Application topically daily as needed (hand rash). Please limit use to 2 Rayen Palen at a time [name], Rockie, MD  Active   naloxone  (NARCAN ) 4 MG/0.1ML LIQD nasal spray kit [id number] Yes Place 1 spray into the nose as needed (opioid overdose). [provider]  Active Self           Med Note SOILA LYLE JAYSON [place] May 03, 2021 11:23 AM)    omeprazole  (PRILOSEC) 40 MG capsule [id number] Yes Take 1 capsule (40 mg total) by mouth 2 (two) times daily. Therisa Bi, MD  Active Self  ondansetron  (ZOFRAN ) 8 MG tablet [id number] Yes Take 1 tablet (8 mg total) by mouth every 8 (eight) hours as needed for nausea or vomiting. [name], [name], MD  Active   oxyCODONE  (ROXICODONE ) 15 MG immediate release tablet [id number] Yes Take 15 mg by mouth every 4 (four) hours. [provider]  Active Self  Tiotropium Bromide  Monohydrate (SPIRIVA  RESPIMAT) 2.5 MCG/ACT AERS [id number] Yes Take 1 puff by mouth in the morning and at bedtime. Emilio Marseille T, FNP  Active Self  tiZANidine  (ZANAFLEX ) 4 MG tablet [id number] Yes TAKE 1 TABLET BY MOUTH EVERY 8 HOURS AS NEEDED Ulis LYLE, [name]  Active   umeclidinium bromide  (INCRUSE ELLIPTA ) 62.5 MCG/ACT AEPB [id number] Yes Inhale 1 puff into the lungs in the morning. [provider]  Active Self  VENTOLIN  HFA 108 (90 Base) MCG/ACT inhaler [id number]  USE 1 TO  2 INHALATIONS BY MOUTH  INTO THE LUNGS EVERY 6 HOURS AS  NEEDED FOR SHORTNESS OF BREATH  OR WHEEZING [name], [name], MD  Active   zolpidem  (AMBIEN ) 5 MG tablet [id number] Yes Take 1 tablet (5 mg total) by mouth at bedtime as needed for sleep. [name], Rockie, MD  Active Self            Recommendation:   PCP Follow-up Continue Current Plan of Care  Follow Up Plan:   Telephone follow-up in 1 month  Nestora Duos, MSN, RN [facility]  [facility], [facility] RN Care Manager Direct Dial: [phone] Fax: [phone]

## 2024-03-11 ENCOUNTER — Telehealth: Payer: Self-pay | Admitting: Acute Care

## 2024-03-11 DIAGNOSIS — Z122 Encounter for screening for malignant neoplasm of respiratory organs: Secondary | ICD-10-CM

## 2024-03-11 DIAGNOSIS — F17210 Nicotine dependence, cigarettes, uncomplicated: Secondary | ICD-10-CM

## 2024-03-11 DIAGNOSIS — M47812 Spondylosis without myelopathy or radiculopathy, cervical region: Secondary | ICD-10-CM | POA: Diagnosis not present

## 2024-03-11 DIAGNOSIS — Z87891 Personal history of nicotine dependence: Secondary | ICD-10-CM

## 2024-03-11 NOTE — Telephone Encounter (Signed)
 Lung Cancer Screening Narrative/Criteria Questionnaire (Cigarette Smokers Only- No Cigars/Pipes/vapes)   Sara Munoz   SDMV:03/19/24 at 1030a/[name]                                           1965-08-23              LDCT: 03/25/24 at 1130a/[facility]    58 y.o.   Phone: [phone]  Lung Screening Narrative (confirm age 70-77 yrs Medicare / 50-80 yrs Private pay insurance)   Insurance information:UHC and medicaid   Referring Provider:Assaker   This screening involves an initial phone call with a team member from our program. It is called a shared decision making visit. The initial meeting is required by insurance and Medicare to make sure you understand the program. This appointment takes about 15-20 minutes to complete. The CT scan will completed at a separate date/time. This scan takes about 5-10 minutes to complete and you may eat and drink before and after the scan.  Criteria questions for Lung Cancer Screening:   Are you a current or former smoker? Current Age began smoking: 15y   If you are a former smoker, what year did you quit smoking? NA   To calculate your smoking history, I need an accurate estimate of how many packs of cigarettes you smoked per day and for how many years. (Not just the number of PPD you are now smoking)   Years smoking 43 x Packs per day 1-3 = Pack years 49   (at least 20 pack yrs)   (Make sure they understand that we need to know how much they have smoked in the past, not just the number of PPD they are smoking now)  Do you have a personal history of cancer?  No    Do you have a family history of cancer? Yes  (cancer type and and relative) multiple aunts/uncles on both sides of family with varying types  Are you coughing up blood?  No  Have you had unexplained weight loss of 15 lbs or more in the last 6 months? No  It looks like you meet all criteria.     Additional information: N/A

## 2024-03-17 ENCOUNTER — Other Ambulatory Visit: Payer: Self-pay | Admitting: Pulmonary Disease

## 2024-03-17 ENCOUNTER — Ambulatory Visit: Payer: Self-pay

## 2024-03-17 ENCOUNTER — Other Ambulatory Visit: Payer: Self-pay | Admitting: Family Medicine

## 2024-03-17 DIAGNOSIS — R0602 Shortness of breath: Secondary | ICD-10-CM

## 2024-03-17 DIAGNOSIS — R110 Nausea: Secondary | ICD-10-CM

## 2024-03-17 NOTE — Telephone Encounter (Signed)
 FYI Only or Action Required?: FYI only for provider.  Patient was last seen in primary care on 03/01/2024 by Sharma Coyer, MD.  Called Nurse Triage reporting Rash.  Symptoms began several months ago.  Interventions attempted: Prescription medications: prescribed cream.  Symptoms are: gradually worsening.  Triage Disposition: See PCP When Office is Open (Within 3 Days)  Patient/caregiver understands and will follow disposition?: Yes      Copied from CRM #[id number]. Topic: Clinical - Red Word Triage >> Mar 17, 2024  3:36 PM Jasmin G wrote: Red Word that prompted transfer to Nurse Triage: Pt sates that she was recently prescribed a cream to treat a rash that's on her foot, and that cream is not working, rash is actually getting worse with lots of red spots. Reason for Disposition  Mild widespread rash  (Exception: Heat rash lasting 3 days or less.)  Answer Assessment - Initial Assessment Questions PT has had rash on right foot. She said they gave her a cream for the rash but isn't helping, she states it is actually slightly worse. She states that she is almost out of that medication. She has not tried anything else to help with the rash. It is slightly itchy. Redness all around foot and on toes. She states the underside of her foot peels. Pt states she has had issues with this foot for years but started to get worse.     1. APPEARANCE of RASH: What does the rash look like? (e.g., blisters, dry flaky skin, red spots, redness, sores)     Red, broke out all the way around it. Not warm to touch 2. SIZE: How big are the spots? (e.g., tip of pen, eraser, coin; inches, centimeters)     redness 3. LOCATION: Where is the rash located?     Right foot all the way around foot even on the toes.  4. COLOR: What color is the rash? (Note: It is difficult to assess rash color in people with darker-colored skin. When this situation occurs, simply ask the caller to describe what they  see.)     redness 5. ONSET: When did the rash begin?     Unsure, has used  bottles of cream  6. FEVER: Do you have a fever? If Yes, ask: What is your temperature, how was it measured, and when did it start?     No 7. ITCHING: Does the rash itch? If Yes, ask: How bad is the itch? (Scale 1-10; or mild, moderate, severe)     Slightly itchy  8. CAUSE: What do you think is causing the rash?     unsure 9. MEDICINE FACTORS: Have you started any new medicines within the last 2 weeks? (e.g., antibiotics)    no 10. OTHER SYMPTOMS: Do you have any other symptoms? (e.g., dizziness, headache, sore throat, joint pain)       Peeling on botom of foot  Protocols used: Rash or Redness - [facility]

## 2024-03-19 ENCOUNTER — Encounter: Payer: Self-pay | Admitting: Adult Health

## 2024-03-19 ENCOUNTER — Ambulatory Visit (INDEPENDENT_AMBULATORY_CARE_PROVIDER_SITE_OTHER): Admitting: Adult Health

## 2024-03-19 DIAGNOSIS — F17210 Nicotine dependence, cigarettes, uncomplicated: Secondary | ICD-10-CM

## 2024-03-19 NOTE — Patient Instructions (Signed)

## 2024-03-19 NOTE — Progress Notes (Signed)
  Virtual Visit via Telephone Note  I connected with DONICA DEROUIN , 03/19/24 10:40 AM by a telemedicine application and verified that I am speaking with the correct person using two identifiers.  Location: Patient: home Provider: home   I discussed the limitations of evaluation and management by telemedicine and the availability of in person appointments. The patient expressed understanding and agreed to proceed.   Shared Decision Making Visit Lung Cancer Screening Program [id number])   Eligibility: 58 y.o. Pack Years Smoking History Calculation = 86 pack years  (# packs/per year x # years smoked) Recent History of coughing up blood  no Unexplained weight loss? no ( >Than 15 pounds within the last 6 months ) Prior History Lung / other cancer no (Diagnosis within the last 5 years already requiring surveillance chest CT Scans). Smoking Status Current Smoker  Visit Components: Discussion included one or more decision making aids. YES Discussion included risk/benefits of screening. YES Discussion included potential follow up diagnostic testing for abnormal scans. YES Discussion included meaning and risk of over diagnosis. YES Discussion included meaning and risk of False Positives. YES Discussion included meaning of total radiation exposure. YES  Counseling Included: Importance of adherence to annual lung cancer LDCT screening. YES Impact of comorbidities on ability to participate in the program. YES Ability and willingness to under diagnostic treatment. YES  Smoking Cessation Counseling: Current Smokers:  Discussed importance of smoking cessation. yes Information about tobacco cessation classes and interventions provided to patient. yes Patient provided with ticket for LDCT Scan. yes Symptomatic Patient. NO Diagnosis Code: Tobacco Use Z72.0 Asymptomatic Patient yes  Counseling - 4 minutes of smoking cessation counseling  (CT Chest Lung Cancer Screening Low Dose W/O CM)  [id number]  Z12.2-Screening of respiratory organs Z87.891-Personal history of nicotine  dependence   Lamarr Myers 03/19/24

## 2024-03-22 ENCOUNTER — Encounter: Payer: Self-pay | Admitting: Family Medicine

## 2024-03-22 ENCOUNTER — Other Ambulatory Visit: Payer: Self-pay | Admitting: Family Medicine

## 2024-03-22 ENCOUNTER — Ambulatory Visit: Admitting: Family Medicine

## 2024-03-22 VITALS — BP 140/85 | HR 68 | Ht 64.0 in | Wt 192.3 lb

## 2024-03-22 DIAGNOSIS — R252 Cramp and spasm: Secondary | ICD-10-CM

## 2024-03-22 DIAGNOSIS — B353 Tinea pedis: Secondary | ICD-10-CM | POA: Diagnosis not present

## 2024-03-22 DIAGNOSIS — L2084 Intrinsic (allergic) eczema: Secondary | ICD-10-CM

## 2024-03-22 MED ORDER — TERBINAFINE HCL 250 MG PO TABS: 250.0000 mg | ORAL_TABLET | Freq: Every day | ORAL | 0 refills | Status: AC

## 2024-03-22 MED ORDER — EUCRISA 2 % EX OINT: TOPICAL_OINTMENT | CUTANEOUS | 1 refills | Status: DC

## 2024-03-22 NOTE — Progress Notes (Signed)
 Acute visit   Patient: Sara Munoz   DOB: [birth year]   58 y.o. Female  MRN: [id number] PCP: Sharma Coyer, MD   Chief Complaint  Patient presents with   Acute Visit    Rash//RN triage completed 03/17/24 PT has had rash on right foot. She said they gave her a cream for the rash but isn't helping, she states it is actually slightly worse. She states that she is almost out of that medication. She has not tried anything else to help with the rash. It is slightly itchy. Redness all around foot and on toes. She states the underside of her foot peels. Pt states she has had issues with this foot for years but worsening. Also located on inner left thigh and arm cramps   Rash   Subjective    Discussed the use of AI scribe software for clinical note transcription with the patient, who gave verbal consent to proceed.  History of Present Illness   Sara Munoz is a 58 year old female who presents with a recurrent rash on her foot and thigh.  She experiences a recurrent rash on her foot and the inside of her leg intermittently for a few years. The foot rash, located on the bottom and around the toes, was severe two days ago but has subsided slightly. It is characterized by peeling and is not itchy. The rash began after fractures to her tibia and fibula and a crushed foot. Lotrisone  cream was ineffective. The rash on the inside of her leg is distinct and has been effectively treated with Eucrisa in the past, though she has not used it in several years.  She experiences severe cramping in her arms and hands, causing her arms to draw in. She is currently taking fenofibrate  and questions if it contributes to the cramping. Her electrolytes were checked about a month and a half ago, during which she was already experiencing cramping. She has a history of bicep tendon issues, with both biceps not attached. She last took prednisone  one to two weeks ago, at a dose of 20 mg twice a day for five  days, for bicep-related issues.        Review of Systems  Objective    BP (!) 140/85 (BP Location: Left Arm, Patient Position: Sitting, Cuff Size: Normal)   Pulse 68   Ht 5' 4 (1.626 m)   Wt 192 lb 4.8 oz (87.2 kg)   LMP 12/16/2017 (Approximate)   SpO2 97%   BMI 33.01 kg/m  Physical Exam Skin:    Comments: Erythematous, raised edges, bottom of R foot  Erythematous, maculopapular rash of L inner thigh       No results found for any visits on 03/22/24.  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 Likely exacerbated by environmental factors such as wearing flip flops and exposure to shared bathing areas. Transitioning to oral antifungal treatment due to lack of response to topical therapy. -  Prescribe terbinafine 250 mg once daily for two weeks for the right foot. - Discontinue Lotrisone  cream.  Eczema Chronic eczema on the left thigh with intermittent flare-ups, previously managed effectively with Eucrisa. [facility] may require prior authorization, potentially delaying availability. - Prescribe Eucrisa ointment for the left thigh. - Advise that Eucrisa may require prior authorization and to continue using the current cream until [place] is available.  Biceps Tendon Rupture Bilateral biceps tendon rupture with visible muscle retraction. Surgical intervention not necessary unless significant issues arise. Current symptoms suggest increased severity in one arm. Prednisone  use is discouraged due to potential adverse effects on adrenal function and possible exacerbation of foot condition. - Advise contacting Doctor  Lang for further evaluation and potential intervention.  Muscle Cramping Severe cramping in arms and hands persisting for over a month. Electrolyte imbalance ruled out. Cramping not attributed to fenofibrate  or recent prednisone  use. Magnesium  glycinate recommended to address cramping and improve sleep quality. - Recommend magnesium  glycinate 400 mg at bedtime to alleviate cramping and improve sleep. - Advise follow-up with Doctor Lang if cramping persists.  General Health Maintenance Ongoing health maintenance activities include lung cancer screening. - Ensure completion of lung cancer screening as scheduled.       Meds ordered this encounter  Medications   terbinafine (LAMISIL) 250 MG tablet    Sig: Take 1 tablet (250 mg total) by mouth daily for 14 days.    Dispense:  14 tablet    Refill:  0   Crisaborole (EUCRISA) 2 % OINT    Sig: Apply topically daily on rash    Dispense:  60 g    Refill:  1     Return if symptoms worsen or fail to improve.      Jon Eva, MD  [facility] [phone] (phone) [phone] (fax)  [facility]

## 2024-03-23 DIAGNOSIS — M25512 Pain in left shoulder: Secondary | ICD-10-CM | POA: Diagnosis not present

## 2024-03-23 DIAGNOSIS — M79661 Pain in right lower leg: Secondary | ICD-10-CM | POA: Diagnosis not present

## 2024-03-23 DIAGNOSIS — M25511 Pain in right shoulder: Secondary | ICD-10-CM | POA: Diagnosis not present

## 2024-03-23 DIAGNOSIS — M79662 Pain in left lower leg: Secondary | ICD-10-CM | POA: Diagnosis not present

## 2024-03-23 DIAGNOSIS — G8929 Other chronic pain: Secondary | ICD-10-CM | POA: Diagnosis not present

## 2024-03-23 DIAGNOSIS — M545 Low back pain, unspecified: Secondary | ICD-10-CM | POA: Diagnosis not present

## 2024-03-24 ENCOUNTER — Other Ambulatory Visit: Payer: Self-pay

## 2024-03-24 NOTE — Telephone Encounter (Signed)
 Requesting Eucrisa but not on med lt

## 2024-03-25 ENCOUNTER — Ambulatory Visit
Admission: RE | Admit: 2024-03-25 | Discharge: 2024-03-25 | Disposition: A | Discharge status: Home / Self Care | Source: Ambulatory Visit | Attending: Acute Care | Admitting: Acute Care

## 2024-03-25 DIAGNOSIS — F17210 Nicotine dependence, cigarettes, uncomplicated: Secondary | ICD-10-CM | POA: Insufficient documentation

## 2024-03-25 DIAGNOSIS — Z122 Encounter for screening for malignant neoplasm of respiratory organs: Secondary | ICD-10-CM | POA: Insufficient documentation

## 2024-03-25 DIAGNOSIS — Z87891 Personal history of nicotine dependence: Secondary | ICD-10-CM | POA: Diagnosis not present

## 2024-03-29 ENCOUNTER — Other Ambulatory Visit: Payer: Self-pay | Admitting: Family Medicine

## 2024-03-29 ENCOUNTER — Other Ambulatory Visit: Payer: Self-pay

## 2024-03-29 DIAGNOSIS — Z122 Encounter for screening for malignant neoplasm of respiratory organs: Secondary | ICD-10-CM

## 2024-03-29 DIAGNOSIS — F17210 Nicotine dependence, cigarettes, uncomplicated: Secondary | ICD-10-CM

## 2024-03-29 DIAGNOSIS — Z87891 Personal history of nicotine dependence: Secondary | ICD-10-CM

## 2024-03-29 MED ORDER — EUCRISA 2 % EX OINT: 1.0000 | TOPICAL_OINTMENT | Freq: Two times a day (BID) | CUTANEOUS | 2 refills | Status: DC | PRN

## 2024-03-30 ENCOUNTER — Ambulatory Visit: Admitting: Pulmonary Disease

## 2024-03-30 ENCOUNTER — Encounter: Payer: Self-pay | Admitting: Pulmonary Disease

## 2024-03-30 VITALS — BP 108/62 | HR 59 | Temp 97.4°F | Ht 64.0 in | Wt 194.0 lb

## 2024-03-30 DIAGNOSIS — F17210 Nicotine dependence, cigarettes, uncomplicated: Secondary | ICD-10-CM

## 2024-03-30 DIAGNOSIS — J439 Emphysema, unspecified: Secondary | ICD-10-CM | POA: Diagnosis not present

## 2024-03-30 DIAGNOSIS — J4489 Other specified chronic obstructive pulmonary disease: Secondary | ICD-10-CM

## 2024-03-30 DIAGNOSIS — E785 Hyperlipidemia, unspecified: Secondary | ICD-10-CM | POA: Diagnosis not present

## 2024-03-30 DIAGNOSIS — I10 Essential (primary) hypertension: Secondary | ICD-10-CM | POA: Diagnosis not present

## 2024-03-30 MED ORDER — PREDNISONE 20 MG PO TABS: 20.0000 mg | ORAL_TABLET | Freq: Every day | ORAL | 0 refills | Status: AC

## 2024-03-30 MED ORDER — AZITHROMYCIN 250 MG PO TABS: 250.0000 mg | ORAL_TABLET | Freq: Every day | ORAL | 0 refills | Status: DC

## 2024-03-30 NOTE — Progress Notes (Signed)
 Synopsis: Referred in by [name], Makie*   Subjective:   PATIENT ID: Sara Munoz GENDER: female DOB: December 19, 1965, MRN: [id number]  Chief Complaint  Patient presents with   Shortness of Breath    SOB. Wheezing. Cough.  Using Breztri  BID, feels like it helps somewhat. Albuterol  2-3 times a day. Duoneb- BID.  Not using Incruse or Spiriva .    HPI Sara Munoz is a pleasant 58 year old female patient with a past medical history of tobacco use, clinical diagnosis of COPD, hypertension and hyperlipidemia presenting today to the pulmonary clinic to establish care.  She was previously diagnosed with COPD and has been on multiple inhaler including Symbicort , Incruse, Spiriva , Dulera  and it is unclear to me which ones we are currently using.  Her main symptoms are cough is sometimes productive, shortness of breath on exertion and intermittent wheezing specifically when she gets a URI.  She was never hospitalized for any respiratory issues.  Family history -her aunt and mom both with lung cancer and COPD who were smokers.  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  She has 2 dogs at home.  No kids.  10.07.2025 OV - Sara Munoz is here to follow up regarding her COPD. Unfortunately she did not do her PFTs. She continues with Breztri  as prescribed. However she is still using her rescue on a regular basis. Discussed with her add on treatments but we will require PFTs and CBC w/ differential which she agrees to do. I will also prescribe to her a short term steroids and azithromycin  as she is having excessive secretions and possibly a bronchitis flare. Finally I reviewed her CT chest w/ her that does not show any concerning nodules. It does show biapical emphysema.   ROS All systems were reviewed and are negative except for the  above Objective:   There were no vitals filed for this visit.    on RA BMI Readings from Last 3 Encounters:  03/22/24 33.01 kg/m  03/01/24 31.58 kg/m  02/11/24 31.93 kg/m   Wt Readings from Last 3 Encounters:  03/22/24 192 lb 4.8 oz (87.2 kg)  03/01/24 184 lb (83.5 kg)  02/11/24 186 lb (84.4 kg)    Physical Exam GEN: NAD, Healthy Appearing HEENT: Supple Neck, Reactive Pupils, EOMI  CVS: Normal S1, Normal S2, RRR, No murmurs or ES appreciated  Lungs: Clear bilateral air entry.  Abdomen: Soft, non tender, non distended, + BS  Extremities: Warm and well perfused, No edema   Labs and imaging were reviewed.   Ancillary Information   CBC    Component Value Date/Time   WBC 8.8 10/02/2023 1456   WBC 10.0 09/21/2023 2114   RBC 5.95 (H) 10/02/2023 1456   RBC 4.84 09/21/2023 2114   HGB 14.0 10/02/2023 1456   HCT 46.8 (H) 10/02/2023 1456   PLT 366 10/02/2023 1456   MCV 79 10/02/2023 1456   MCH 23.5 (L) 10/02/2023 1456   MCH 22.3 (L) 09/21/2023 2114   MCHC 29.9 (L) 10/02/2023 1456   MCHC 29.2 (L) 09/21/2023 2114   RDW 27.8 (H) 10/02/2023 1456   LYMPHSABS 2.6 04/17/2023 1426   MONOABS 0.7 12/12/2021 0910   EOSABS 0.2 04/17/2023 1426   BASOSABS 0.1 04/17/2023 1426  Labs and imaging were reviewed.     No data to display  Assessment & Plan:  Sara Munoz is a pleasant 58 year old female patient with a past medical history of tobacco use, clinical diagnosis of COPD, hypertension and hyperlipidemia presenting today to the pulmonary clinic to establish care.  # Clinical diagnosis of COPD pending PFTs. W/ emphysema nad chronic bronchitis.  A1AT - 118 MM   []  PFTs []  c/w budesonide -formoterol -glycopyrrolate  [Breztri ] 160-4.52 puffs twice a day.  Advised mouth rinsing with water  after each use.  Inhaler use training with [employer] provided. []  Okay to continue with albuterol  and DuoNebs on an as-needed basis. []  Azithromycin  and prednisone  for 5 days.   #  Tobacco use disorder 120 pack/year.  4 minutes spent discussing strategy for smoking cessation.  Provided NRT.  []  Enrolled in lung cancer screening program.  RTC 3 months   I spent 40 minutes caring for this patient today, including preparing to see the patient, obtaining a medical history , reviewing a separately obtained history, performing a medically appropriate examination and/or evaluation, counseling and educating the patient/family/caregiver, ordering medications, tests, or procedures, documenting clinical information in the electronic health record, and independently interpreting results (not separately reported/billed) and communicating results to the patient/family/caregiver  Darrin Barn, MD [facility] 03/30/2024 1:15 PM

## 2024-04-07 ENCOUNTER — Other Ambulatory Visit: Payer: Self-pay

## 2024-04-07 NOTE — Patient Instructions (Signed)
 Visit Information  Thank you for taking time to visit with me today. Please don't hesitate to contact me if I can be of assistance to you before our next scheduled appointment.  Your next care management appointment is by telephone on 05/03/2024 at 9:30 am  Telephone follow-up in 1 month  SMOKING RESOURCE: 1-800-QUIT-NOW  Please call the care guide team at [phone] if you need to cancel, schedule, or reschedule an appointment.   Please call the Suicide and Crisis Lifeline: 988 call the [place]  National Suicide Prevention Lifeline: 910-298-2095 or TTY: (365) 830-4571 TTY 4230267086) to talk to a trained counselor call 1-800-273-TALK (toll free, 24 hour hotline) go to [facility] [address] [place] [phone]) call 911 if you are experiencing a Mental Health or Behavioral Health Crisis or need someone to talk to.  Nestora Duos, MSN, RN [place]  [facility], [facility] RN Care Manager Direct Dial: [phone] Fax: [phone]   Managing the Challenge of Quitting Smoking Quitting smoking is a physical and mental challenge. You may have cravings, withdrawal symptoms, and temptation to smoke. Before quitting, work with your health care provider to make a plan that can help you manage quitting. Making a plan before you quit may keep you from smoking when you have the urge to smoke while trying to quit. How to manage lifestyle changes Managing stress Stress can make you want to smoke, and wanting to smoke may cause stress. It is important to find ways to manage your stress. You could try some of the following: Practice relaxation techniques. Breathe slowly and deeply, in through your nose and out through your mouth. Listen to music. Soak in a bath or take a shower. Imagine a peaceful place or vacation. Get some support. Talk with family or friends about your stress. Join a support group. Talk with a  counselor or therapist. Get some physical activity. Go for a walk, run, or bike ride. Play a favorite sport. Practice yoga.  Medicines Talk with your health care provider about medicines that might help you deal with cravings and make quitting easier for you. Relationships Social situations can be difficult when you are quitting smoking. To manage this, you can: Avoid parties and other social situations where people might be smoking. Avoid alcohol . Leave right away if you have the urge to smoke. Explain to your family and friends that you are quitting smoking. Ask for support and let them know you might be a bit grumpy. Plan activities where smoking is not an option. General instructions Be aware that many people gain weight after they quit smoking. However, not everyone does. To keep from gaining weight, have a plan in place before you quit, and stick to the plan after you quit. Your plan should include: Eating healthy snacks. When you have a craving, it may help to: Eat popcorn, or try carrots, celery, or other cut vegetables. Chew sugar-free gum. Changing how you eat. Eat small portion sizes at meals. Eat 4-6 small meals throughout the day instead of 1-2 large meals a day. Be mindful when you eat. You should avoid watching television or doing other things that might distract you as you eat. Exercising regularly. Make time to exercise each day. If you do not have time for a long workout, do short bouts of exercise for 5-10 minutes several times a day. Do some form of strengthening exercise, such as weight lifting. Do some exercise that gets your heart beating and causes you to breathe  deeply, such as walking fast, running, swimming, or biking. This is very important. Drinking plenty of water  or other low-calorie or no-calorie drinks. Drink enough fluid to keep your urine pale yellow.  How to recognize withdrawal symptoms Your body and mind may experience discomfort as you try to get  used to not having nicotine  in your system. These effects are called withdrawal symptoms. They may include: Feeling hungrier than normal. Having trouble concentrating. Feeling irritable or restless. Having trouble sleeping. Feeling depressed. Craving a cigarette. These symptoms may surprise you, but they are normal to have when quitting smoking. To manage withdrawal symptoms: Avoid places, people, and activities that trigger your cravings. Remember why you want to quit. Get plenty of sleep. Avoid coffee and other drinks that contain caffeine. These may worsen some of your symptoms. How to manage cravings Come up with a plan for how to deal with your cravings. The plan should include the following: A definition of the specific situation you want to deal with. An activity or action you will take to replace smoking. A clear idea for how this action will help. The name of someone who could help you with this. Cravings usually last for 5-10 minutes. Consider taking the following actions to help you with your plan to deal with cravings: Keep your mouth busy. Chew sugar-free gum. Suck on hard candies or a straw. Brush your teeth. Keep your hands and body busy. Change to a different activity right away. Squeeze or play with a ball. Do an activity or a hobby, such as making bead jewelry, practicing needlepoint, or working with wood. Mix up your normal routine. Take a short exercise break. Go for a quick walk, or run up and down stairs. Focus on doing something kind or helpful for someone else. Call a friend or family member to talk during a craving. Join a support group. Contact a quitline. Where to find support To get help or find a support group: Call the National Cancer Institute's Smoking Quitline: 1-800-QUIT-NOW 310-478-4440) Text QUIT to SmokefreeTXT: 521151 Where to find more information Visit these websites to find more information on quitting smoking: U.S. Department of Health  and Human Services: www.smokefree.gov American Lung Association: www.freedomfromsmoking.org Centers for Disease Control and Prevention (CDC): FootballExhibition.com.br American Heart Association: www.heart.org Contact a health care provider if: You want to change your plan for quitting. The medicines you are taking are not helping. Your eating feels out of control or you cannot sleep. You feel depressed or become very anxious. Summary Quitting smoking is a physical and mental challenge. You will face cravings, withdrawal symptoms, and temptation to smoke again. Preparation can help you as you go through these challenges. Try different techniques to manage stress, handle social situations, and prevent weight gain. You can deal with cravings by keeping your mouth busy (such as by chewing gum), keeping your hands and body busy, calling family or friends, or contacting a quitline for people who want to quit smoking. You can deal with withdrawal symptoms by avoiding places where people smoke, getting plenty of rest, and avoiding drinks that contain caffeine. This information is not intended to replace advice given to you by your health care provider. Make sure you discuss any questions you have with your health care provider. Document Revised: 06/01/2021 Document Reviewed: 06/01/2021 Elsevier Patient Education  2024 Elsevier Inc.   Steps to Quit Smoking Smoking tobacco is the leading cause of preventable death. It can affect almost every organ in the body. Smoking puts you and  people around you at risk for many serious, long-lasting (chronic) diseases. Quitting smoking can be hard, but it is one of the best things that you can do for your health. It is never too late to quit. Do not give up if you cannot quit the first time. Some people need to try many times to quit. Do your best to stick to your quit plan, and talk with your doctor if you have any questions or concerns. How do I get ready to quit? Pick a date  to quit. Set a date within the next 2 Iyanni Hepp to give you time to prepare. Write down the reasons why you are quitting. Keep this list in places where you will see it often. Tell your family, friends, and co-workers that you are quitting. Their support is important. Talk with your doctor about the choices that may help you quit. Find out if your health insurance will pay for these treatments. Know the people, places, things, and activities that make you want to smoke (triggers). Avoid them. What first steps can I take to quit smoking? Throw away all cigarettes at home, at work, and in your car. Throw away the things that you use when you smoke, such as ashtrays and lighters. Clean your car. Empty the ashtray. Clean your home, including curtains and carpets. What can I do to help me quit smoking? Talk with your doctor about taking medicines and seeing a counselor. You are more likely to succeed when you do both. If you are pregnant or breastfeeding: Talk with your doctor about counseling or other ways to quit smoking. Do not take medicine to help you quit smoking unless your doctor tells you to. Quit right away Quit smoking completely, instead of slowly cutting back on how much you smoke over a period of time. Stopping smoking right away may be more successful than slowly quitting. Go to counseling. In-person is best if this is an option. You are more likely to quit if you go to counseling sessions regularly. Take medicine You may take medicines to help you quit. Some medicines need a prescription, and some you can buy over-the-counter. Some medicines may contain a drug called nicotine  to replace the nicotine  in cigarettes. Medicines may: Help you stop having the desire to smoke (cravings). Help to stop the problems that come when you stop smoking (withdrawal symptoms). Your doctor may ask you to use: Nicotine  patches, gum, or lozenges. Nicotine  inhalers or sprays. Non-nicotine  medicine that  you take by mouth. Find resources Find resources and other ways to help you quit smoking and remain smoke-free after you quit. They include: Online chats with a Veterinary surgeon. Phone quitlines. Printed Materials engineer. Support groups or group counseling. Text messaging programs. Mobile phone apps. Use apps on your mobile phone or tablet that can help you stick to your quit plan. Examples of free services include Quit Guide from the CDC and smokefree.gov  What can I do to make it easier to quit?  Talk to your family and friends. Ask them to support and encourage you. Call a phone quitline, such as 1-800-QUIT-NOW, reach out to support groups, or work with a Veterinary surgeon. Ask people who smoke to not smoke around you. Avoid places that make you want to smoke, such as: Bars. Parties. Smoke-break areas at work. Spend time with people who do not smoke. Lower the stress in your life. Stress can make you want to smoke. Try these things to lower stress: Getting regular exercise. Doing deep-breathing exercises. Doing  yoga. Meditating. What benefits will I see if I quit smoking? Over time, you may have: A better sense of smell and taste. Less coughing and sore throat. A slower heart rate. Lower blood pressure. Clearer skin. Better breathing. Fewer sick days. Summary Quitting smoking can be hard, but it is one of the best things that you can do for your health. Do not give up if you cannot quit the first time. Some people need to try many times to quit. When you decide to quit smoking, make a plan to help you succeed. Quit smoking right away, not slowly over a period of time. When you start quitting, get help and support to keep you smoke-free. This information is not intended to replace advice given to you by your health care provider. Make sure you discuss any questions you have with your health care provider. Document Revised: 06/01/2021 Document Reviewed: 06/01/2021 Elsevier Patient  Education  2024 [employer].

## 2024-04-07 NOTE — Patient Outreach (Signed)
 Complex Care Management   Visit Note  04/07/2024  Name:  Sara Munoz MRN: [id number] DOB: 06/20/1966  Situation: Referral received for Complex Care Management related to COPD and HTN and Anxiety I obtained verbal consent from Patient.  Visit completed with Patient  on the phone  Background:   Past Medical History:  Diagnosis Date   Anemia    Anxiety    Aortic atherosclerosis    Asthma    Atypical chest pain 08/02/2015   Carotid artery disease    Chronic back pain    Chronic nausea    Chronic, continuous use of opioids 02/27/2016   a.) has naloxone  Rx available   COPD (chronic obstructive pulmonary disease) (HCC)    Coronary artery disease    a.) LHC 2011: no sig CAD (report unavailable); b.) LHC 07/07/2014: 10-20% mLM, 25% LAD, 30-40% LCx, 20-30% RCA -- med mgmt; c.) LHC 02/22/2016: 25% LM, 25% pLCx --> med mgmt; d.) MPI 07/10/2017: no ischemia; e.) MPI 09/01/2019: no ischemia; f.) cCTA 10/28/2022: Ca score 1092 (99th percentile for age/sex/race match control)   Current smoker    DDD (degenerative disc disease), cervical    a.) s/p ACDF C5-C7 and PSIF C5-T1   DDD (degenerative disc disease), lumbar 05/23/2020   a.) s/p L5-S1 PLIF   GERD (gastroesophageal reflux disease)    Hepatic steatosis    Hepatitis C virus infection cured after antiviral drug therapy    a.) s/p Tx with standard glecaprevir-pibrentasvir course   Hypercholesteremia    Hyperkalemia    Hyperlipidemia    Hypertension    Hypokalemia    Hypomagnesemia 01/04/2014   Incomplete tear of left rotator cuff    Insomnia    a.) on hypnotic PRN (zolpidem )   MI (myocardial infarction) (HCC) 2012   Migraines    MSSA (methicillin susceptible Staphylococcus aureus) septicemia (HCC) 03/03/2014   a.) TEE (-) for valvular vegitation   Nerve pain    per patient, in the lower back   Osteoarthritis    Pneumonia    PTSD (post-traumatic stress disorder)    PVD (peripheral vascular disease)    Tendinitis of left  rotator cuff    TIA (transient ischemic attack) 2014    Assessment: Patient Reported Symptoms:  Cognitive Cognitive Status: No symptoms reported Cognitive/Intellectual Conditions Management [RPT]: None reported or documented in medical history or problem list   Health Maintenance Behaviors: Annual physical exam Health Facilitated by: Pain control  Neurological Neurological Review of Symptoms: Headaches, Weakness Neurological Management Strategies: Medication therapy, Routine screening Neurological Comment: Neuro Qulipta  60 mg for headaches, little improvement - will do further testing since no relief  HEENT HEENT Symptoms Reported: No symptoms reported HEENT Management Strategies: Routine screening HEENT Self-Management Outcome: 4 (good)    Cardiovascular Cardiovascular Symptoms Reported: No symptoms reported Does patient have uncontrolled Hypertension?: No Cardiovascular Management Strategies: Routine screening Weight: 192 lb (87.1 kg) Cardiovascular Comment: BP 109/70 few days ago, improved trigs - positive feedback given for good food choices  Respiratory Respiratory Symptoms Reported: Shortness of breath, Dry cough, Wheezing Other Respiratory Symptoms: saw pulm and prescribed Z-pak and Prednisone  - completed and sx improved but still with cough and wheeze, using Breztri , and duonebs, will  call pulm as discussed for lingering sx, aware of red flags for ED, PO2 needs battery Additional Respiratory Details: smoking slightly less than a pack a day - requesting patch and gum, RNCM will request from PCP, doing breathing exercises Respiratory Management Strategies: Routine screening, Medication therapy, Breathing exercise  Endocrine Is patient diabetic?: No    Gastrointestinal Gastrointestinal Symptoms Reported: Nausea Other Gastrointestinal Symptoms: normal nausea zofran , will schedule with GI Gastrointestinal Management Strategies: Medication therapy Gastrointestinal Comment:  states wathcing food choices - trigs inporved appetite normal    Genitourinary Genitourinary Symptoms Reported: No symptoms reported    Integumentary Additional Integumentary Details: abdoinal abscess healed completely fnished doxy - thigh, healing with Eucrisa, feet improved with topical and po med Skin Management Strategies: Medication therapy, Routine screening  Musculoskeletal Musculoskelatal Symptoms Reviewed: Back pain, Difficulty walking, Joint pain, Limited mobility, Muscle pain, Unsteady gait, Weakness Additional Musculoskeletal Details: cane prn - requesting walker - RNCM will request from PCP for near falls, seeing Pain Management and Ortho - possibly cerivcal spine surgery since injections did not work - discussed need to stop smoking before surgery, pain overall 7/10 with med used regularly, lowest rating recently is 4/10, hopes if has surgery can go off opiods and take suboxin which worked well for her before Musculoskeletal Management Strategies: Medication therapy, Routine screening, Medical device Musculoskeletal Self-Management Outcome: 3 (uncertain) Falls in the past year?: Yes Number of falls in past year: 1 or less Was there an injury with Fall?: Yes Fall Risk Category Calculator: 2 Patient Fall Risk Level: Moderate Fall Risk Patient at Risk for Falls Due to: History of fall(s), Impaired balance/gait, Impaired mobility, Orthopedic patient Fall risk Follow up: Falls evaluation completed, Falls prevention discussed  Psychosocial Psychosocial Symptoms Reported: Anxiety - if selected complete GAD Additional Psychological Details: med helps a lot, wants to quit smoking, declines counseling Behavioral Management Strategies: Medication therapy Major Change/Loss/Stressor/Fears (CP): Medical condition, self      04/07/2024    PHQ2-9 Depression Screening   Little interest or pleasure in doing things Several days  Feeling down, depressed, or hopeless Not at all  PHQ-2 - Total  Score 1  Trouble falling or staying asleep, or sleeping too much Several days  Feeling tired or having little energy More than half the days  Poor appetite or overeating  Several days  Feeling bad about yourself - or that you are a failure or have let yourself or your family down Not at all  Trouble concentrating on things, such as reading the newspaper or watching television Several days  Moving or speaking so slowly that other people could have noticed.  Or the opposite - being so fidgety or restless that you have been moving around a lot more than usual Not at all  Thoughts that you would be better off dead, or hurting yourself in some way Not at all  PHQ2-9 Total Score 6  If you checked off any problems, how difficult have these problems made it for you to do your work, take care of things at home, or get along with other people    Depression Interventions/Treatment      There were no vitals filed for this visit.  Medications Reviewed Today     Reviewed by Devra Lands, RN (Registered Nurse) on 04/07/24 at 1008  Med List Status: <None>   Medication Order Taking? Sig Documenting Provider Last Dose Status Informant  albuterol  (VENTOLIN  HFA) 108 (90 Base) MCG/ACT inhaler [id number] Yes Inhale 2 puffs into the lungs every 6 (six) hours as needed for wheezing or shortness of breath. Malka Domino, MD  Active   amLODipine  (NORVASC ) 5 MG tablet [id number] Yes TAKE 1 TABLET BY MOUTH EVERYDAY AT BEDTIME [name], [name], MD  Active   aspirin  EC 81 MG tablet [id number] Yes Take 81 mg  by mouth in the morning. Swallow whole. [provider]  Active Self  atorvastatin  (LIPITOR) 40 MG tablet [id number] Yes TAKE 1 TABLET BY MOUTH EVERY DAY IN THE KARNA Darliss Rogue, MD  Active   azithromycin  (ZITHROMAX ) 250 MG tablet [id number]  Take 1 tablet (250 mg total) by mouth daily.  Patient not taking: Reported on 04/07/2024   Malka Domino, MD  Active    budeson-glycopyrrolate -formoterol  (BREZTRI  AEROSPHERE) 160-9-4.8 MCG/ACT AERO inhaler [id number] Yes Inhale 2 puffs into the lungs in the morning and at bedtime. Malka Domino, MD  Active   clobetasol ointment (TEMOVATE) 0.05 % [id number] Yes Apply 1 Application topically 2 (two) times daily. Myrla Jon HERO, MD  Active   clonazePAM  (KLONOPIN ) 2 MG tablet [id number] Yes Take 1 tablet (2 mg total) by mouth 2 (two) times daily. [name], [name], MD  Active   docusate sodium  (COLACE) 100 MG capsule [id number] Yes Take 100 mg by mouth in the morning. [provider]  Active Self  fenofibrate  (TRICOR ) 48 MG tablet [id number] Yes Take 1 tablet (48 mg total) by mouth daily. [name], [name], MD  Active   gabapentin  (NEURONTIN ) 600 MG tablet [id number] Yes Take 600 mg by mouth 4 (four) times daily. [provider]  Active Self  ipratropium-albuterol  (DUONEB) 0.5-2.5 (3) MG/3ML SOLN [id number] Yes INHALE 3 ML BY NEBULIZER EVERY 6 HOURS AS NEEDED Assaker, Domino, MD  Active   isosorbide  mononitrate (IMDUR ) 30 MG 24 hr tablet [id number] Yes Take 1 tablet (30 mg total) by mouth daily. Gerard Frederick, NP  Active   lisinopril  (ZESTRIL ) 20 MG tablet [id number] Yes Take 1 tablet (20 mg total) by mouth daily. [name], [name], MD  Active   metoprolol  tartrate (LOPRESSOR ) 25 MG tablet [id number] Yes TAKE ONE-HALF TABLET BY MOUTH  TWICE DAILY [name], [name], MD  Active Self  naloxone  (NARCAN ) 4 MG/0.1ML LIQD nasal spray kit [id number] Yes Place 1 spray into the nose as needed (opioid overdose). [provider]  Active Self           Med Note SOILA LYLE JAYSON [place] May 03, 2021 11:23 AM)    omeprazole  (PRILOSEC) 40 MG capsule [id number] Yes Take 1 capsule (40 mg total) by mouth 2 (two) times daily. Therisa Bi, MD  Active Self  ondansetron  (ZOFRAN ) 8 MG tablet [id number] Yes TAKE 1 TABLET BY MOUTH EVERY 8 HOURS AS NEEDED FOR NAUSEA OR VOMITING.  [name], [name], MD  Active   oxyCODONE  (ROXICODONE ) 15 MG immediate release tablet [id number] Yes Take 15 mg by mouth every 4 (four) hours. [provider]  Active Self  tiZANidine  (ZANAFLEX ) 4 MG tablet [id number] Yes TAKE 1 TABLET BY MOUTH EVERY 8 HOURS AS NEEDED Ulis LYLE, [name]  Active   VENTOLIN  HFA 108 (90 Base) MCG/ACT inhaler [id number]  USE 1 TO 2 INHALATIONS BY MOUTH  INTO THE LUNGS EVERY 6 HOURS AS  NEEDED FOR SHORTNESS OF BREATH  OR WHEEZING  Patient not taking: Reported on 03/30/2024   [name], Rockie, MD  Active   zolpidem  (AMBIEN ) 5 MG tablet [id number] Yes Take 1 tablet (5 mg total) by mouth at bedtime as needed for sleep. [name], Rockie, MD  Active Self            Recommendation:   PCP Follow-up Continue Current Plan of Care Call Pulmonary regarding lingering symptoms, schedule PFTs, make regular follow up with PCP.  Follow Up Plan:   Telephone follow-up in 1 month  Nestora Duos, MSN, RN [facility]  [facility], [facility] RN Care Manager Direct Dial: [phone] Fax: [phone]

## 2024-04-09 ENCOUNTER — Ambulatory Visit: Payer: Self-pay | Admitting: Pulmonary Disease

## 2024-04-09 NOTE — Telephone Encounter (Signed)
 FYI Only or Action Required?: FYI only for provider.  Patient is followed in Pulmonology for Cough, last seen on 03/30/2024 by Malka Domino, MD.  Called Nurse Triage reporting Cough.  Symptoms began Ongoing x 1 week before last appt. On 10/7; symptoms are now worsening in severity.  Interventions attempted: Rescue inhaler, Maintenance inhaler, and Nebulizer treatments.  Symptoms are: gradually worsening.  Triage Disposition: Go to ED Now (Notify PCP)  Patient/caregiver understands and will follow disposition?: Yes  **Patient referred to the ED for moderate SOB, severe wheezing**          Copied from CRM #[id number]. Topic: Clinical - Red Word Triage >> Apr 09, 2024  4:14 PM [name] wrote: Red Word that prompted transfer to Nurse Triage: Patient was treated for bronchitis recently, she is still experiencing the wheezing and coughing. She also has copd on top of this. She is requesting more antibiotic/steroid if her doctor approves. Reason for Disposition  [1] MODERATE difficulty breathing (e.g., speaks in phrases, SOB even at rest, pulse 100-120) AND [2] still present when not coughing  Answer Assessment - Initial Assessment Questions 1. ONSET: When did the cough begin?      One week before 10/7, seen in office on 10/7 for same symptoms   2. SEVERITY: How bad is the cough today?      Mild   3. SPUTUM: Describe the color of your sputum (e.g., none, dry cough; clear, white, yellow, green)     None  4. HEMOPTYSIS: Are you coughing up any blood? If Yes, ask: How much? (e.g., flecks, streaks, tablespoons, etc.)     No  5. DIFFICULTY BREATHING: Are you having difficulty breathing? If Yes, ask: How bad is it? (e.g., mild, moderate, severe)       SOB worse with COPD, and bronchitis- moderate   6. FEVER: Do you have a fever? If Yes, ask: What is your temperature, how was it measured, and when did it start?     No   7. CARDIAC HISTORY: Do you have any  history of heart disease? (e.g., heart attack, congestive heart failure)      No   8. LUNG HISTORY: Do you have any history of lung disease?  (e.g., pulmonary embolus, asthma, emphysema)     COPD  9. PE RISK FACTORS: Do you have a history of blood clots? (or: recent major surgery, recent prolonged travel, bedridden)     No   10. OTHER SYMPTOMS: Do you have any other symptoms? (e.g., runny nose, wheezing, chest pain)       Wheezing- severe.   Patient is using inhaler, nebulizer, and albuterol ; not providing relief. Patient referred to the ED for moderate SOB, severe wheezing. She agrees with plan of care.  Protocols used: Cough - Acute Productive-A-AH

## 2024-04-12 NOTE — Telephone Encounter (Signed)
 Noted. Nothing further needed.

## 2024-04-13 ENCOUNTER — Other Ambulatory Visit: Payer: Self-pay | Admitting: Physician Assistant

## 2024-04-13 ENCOUNTER — Other Ambulatory Visit: Payer: Self-pay | Admitting: Pulmonary Disease

## 2024-04-13 DIAGNOSIS — R0602 Shortness of breath: Secondary | ICD-10-CM

## 2024-04-16 ENCOUNTER — Other Ambulatory Visit: Payer: Self-pay | Admitting: Family Medicine

## 2024-04-16 DIAGNOSIS — R29898 Other symptoms and signs involving the musculoskeletal system: Secondary | ICD-10-CM

## 2024-04-16 DIAGNOSIS — M961 Postlaminectomy syndrome, not elsewhere classified: Secondary | ICD-10-CM

## 2024-04-16 DIAGNOSIS — F17219 Nicotine dependence, cigarettes, with unspecified nicotine-induced disorders: Secondary | ICD-10-CM

## 2024-04-16 DIAGNOSIS — Z9181 History of falling: Secondary | ICD-10-CM

## 2024-04-16 DIAGNOSIS — R5382 Chronic fatigue, unspecified: Secondary | ICD-10-CM

## 2024-04-16 DIAGNOSIS — G8929 Other chronic pain: Secondary | ICD-10-CM

## 2024-04-16 MED ORDER — NICOTINE 14 MG/24HR TD PT24: 14.0000 mg | MEDICATED_PATCH | Freq: Every day | TRANSDERMAL | 2 refills | Status: DC

## 2024-04-16 MED ORDER — NICOTINE POLACRILEX 4 MG MT GUM: 4.0000 mg | CHEWING_GUM | OROMUCOSAL | 3 refills | Status: DC | PRN

## 2024-04-16 NOTE — Progress Notes (Signed)
 Received CC message from RN requesting rolling walker due to bilateral leg weakness as well as NRT patches to help with patient's smoking cessation

## 2024-04-20 DIAGNOSIS — M961 Postlaminectomy syndrome, not elsewhere classified: Secondary | ICD-10-CM | POA: Diagnosis not present

## 2024-04-20 DIAGNOSIS — G588 Other specified mononeuropathies: Secondary | ICD-10-CM | POA: Diagnosis not present

## 2024-04-20 DIAGNOSIS — M792 Neuralgia and neuritis, unspecified: Secondary | ICD-10-CM | POA: Diagnosis not present

## 2024-04-20 DIAGNOSIS — M79622 Pain in left upper arm: Secondary | ICD-10-CM | POA: Diagnosis not present

## 2024-04-21 ENCOUNTER — Encounter: Payer: Self-pay | Admitting: Family Medicine

## 2024-04-21 ENCOUNTER — Telehealth: Admitting: Family Medicine

## 2024-04-21 DIAGNOSIS — M25559 Pain in unspecified hip: Secondary | ICD-10-CM | POA: Diagnosis not present

## 2024-04-21 DIAGNOSIS — J441 Chronic obstructive pulmonary disease with (acute) exacerbation: Secondary | ICD-10-CM

## 2024-04-21 DIAGNOSIS — F17210 Nicotine dependence, cigarettes, uncomplicated: Secondary | ICD-10-CM | POA: Diagnosis not present

## 2024-04-21 DIAGNOSIS — R531 Weakness: Secondary | ICD-10-CM | POA: Diagnosis not present

## 2024-04-21 DIAGNOSIS — M25569 Pain in unspecified knee: Secondary | ICD-10-CM

## 2024-04-21 DIAGNOSIS — Z7409 Other reduced mobility: Secondary | ICD-10-CM

## 2024-04-21 DIAGNOSIS — J449 Chronic obstructive pulmonary disease, unspecified: Secondary | ICD-10-CM | POA: Diagnosis not present

## 2024-04-21 DIAGNOSIS — D638 Anemia in other chronic diseases classified elsewhere: Secondary | ICD-10-CM

## 2024-04-21 DIAGNOSIS — M545 Low back pain, unspecified: Secondary | ICD-10-CM | POA: Diagnosis not present

## 2024-04-21 DIAGNOSIS — F17200 Nicotine dependence, unspecified, uncomplicated: Secondary | ICD-10-CM

## 2024-04-21 DIAGNOSIS — G8929 Other chronic pain: Secondary | ICD-10-CM

## 2024-04-21 MED ORDER — NICOTINE 7 MG/24HR TD PT24: 7.0000 mg | MEDICATED_PATCH | Freq: Every day | TRANSDERMAL | 5 refills | Status: DC

## 2024-04-21 NOTE — Assessment & Plan Note (Signed)
 Anemia of chronic disease Chronic condition  Reports weakness and lightheadedness, similar to previous episodes of low blood levels, suggesting possible exacerbation. - Order labs to check hemoglobin, iron , and ferritin levels. - will check TSH,T4 and CMP  - Advise to come in for lab work between 8 AM and 11:30 AM or 1 PM to 4:30 PM.

## 2024-04-21 NOTE — Patient Instructions (Signed)
 To keep you healthy, please keep in mind the following health maintenance items that you are due for:   Health Maintenance Due  Topic Date Due   COVID-19 Vaccine (1) Never done   DTaP/Tdap/Td (1 - Tdap) Never done   Pneumococcal Vaccine: 50+ Years (1 of 2 - PCV) Never done   Zoster Vaccines- Shingrix (1 of 2) Never done     Best Wishes,   Dr. Lang

## 2024-04-21 NOTE — Assessment & Plan Note (Signed)
 Mobility impairment Significant balance issues and recent falls, including a fall onto the couch due to lightheadedness and weakness. Suspects balance issues may be related to her spine and plans to discuss with her surgeon. - Ordered rolling walker with a seat to assist with mobility and prevent falls. - Check with medical assistant for updates on the rolling walker order and delivery status. - Send MyChart message with updates on walker delivery.

## 2024-04-21 NOTE — Assessment & Plan Note (Signed)
 Chronic condition  COPD is a significant concern, particularly with ongoing tobacco use. Smoking cessation is a priority to prevent further respiratory health deterioration. - Follow up with pulmonologist - Continue Dulera  100/5 mcg two puffs every morning - Continue Spiriva  2.5 mcg morning and bedtime - Continue albuterol  as needed

## 2024-04-21 NOTE — Progress Notes (Signed)
 MyChart Video Visit    Virtual Visit via Video Note   This format is felt to be most appropriate for this patient at this time. Physical exam was limited by quality of the video and audio technology used for the visit.   Patient location: Patient's home address   Provider location: [facility]  [address], [address]  [place], [place] [address]   I discussed the limitations of evaluation and management by telemedicine and the availability of in person appointments. The patient expressed understanding and agreed to proceed.  Patient: Sara Munoz   DOB: 03/02/1966   58 y.o. Female  MRN: [id number] Visit Date: 04/21/2024  Today's healthcare provider: Rockie Agent, MD   Chief Complaint  Patient presents with   Nicotine  Dependence   Fatigue   Subjective    Nicotine  Dependence Her urge triggers include company of smokers.     Discussed the use of AI scribe software for clinical note transcription with the patient, who gave verbal consent to proceed.  History of Present Illness Sara Munoz is a 58 year old female with chronic pain and nicotine  dependence who presents for a virtual visit to discuss the need for a rolling walker and nicotine  patches.  She experiences weakness and lightheadedness, similar to previous episodes associated with low blood levels. She has balance issues leading to falls, with the most recent fall occurring yesterday onto the couch. She describes her legs as feeling like they might 'give away' at any moment, raising concerns about her balance and fall risk.  She smokes approximately half a pack of cigarettes per day and is attempting to reduce her smoking. She has been prescribed nicotine  patches and gum, but they are unaffordable due to lack of insurance coverage. She is interested in exploring more cost-effective smoking cessation options.  She has a history of anemia of chronic disease, cervical radiculopathy,  chronic hepatitis, chronic cough, chronic hip, knee, and low back pain, chronic migraines, chronic nausea, COPD, hyperlipidemia, chronic hypertension, chronic generalized anxiety, chronic insomnia, PTSD, and sleep apnea.  She requires three business days' notice for transportation to get labs done.      Past Medical History:  Diagnosis Date   Anemia    Anxiety    Aortic atherosclerosis    Asthma    Atypical chest pain 08/02/2015   Carotid artery disease    Chronic back pain    Chronic nausea    Chronic, continuous use of opioids 02/27/2016   a.) has naloxone  Rx available   COPD (chronic obstructive pulmonary disease) (HCC)    Coronary artery disease    a.) LHC 2011: no sig CAD (report unavailable); b.) LHC 07/07/2014: 10-20% mLM, 25% LAD, 30-40% LCx, 20-30% RCA -- med mgmt; c.) LHC 02/22/2016: 25% LM, 25% pLCx --> med mgmt; d.) MPI 07/10/2017: no ischemia; e.) MPI 09/01/2019: no ischemia; f.) cCTA 10/28/2022: Ca score 1092 (99th percentile for age/sex/race match control)   Current smoker    DDD (degenerative disc disease), cervical    a.) s/p ACDF C5-C7 and PSIF C5-T1   DDD (degenerative disc disease), lumbar 05/23/2020   a.) s/p L5-S1 PLIF   GERD (gastroesophageal reflux disease)    Hepatic steatosis    Hepatitis C virus infection cured after antiviral drug therapy    a.) s/p Tx with standard glecaprevir-pibrentasvir course   Hypercholesteremia    Hyperkalemia    Hyperlipidemia    Hypertension    Hypokalemia    Hypomagnesemia 01/04/2014  Incomplete tear of left rotator cuff    Insomnia    a.) on hypnotic PRN (zolpidem )   MI (myocardial infarction) (HCC) 2012   Migraines    MSSA (methicillin susceptible Staphylococcus aureus) septicemia (HCC) 03/03/2014   a.) TEE (-) for valvular vegitation   Nerve pain    per patient, in the lower back   Osteoarthritis    Pneumonia    PTSD (post-traumatic stress disorder)    PVD (peripheral vascular disease)    Tendinitis of left  rotator cuff    TIA (transient ischemic attack) 2014       04/07/2024   10:25 AM 03/22/2024    2:41 PM 03/10/2024   10:20 AM  PHQ9 SCORE ONLY  PHQ-9 Total Score 6 6 1        04/07/2024   10:26 AM 03/22/2024    2:41 PM 03/10/2024   10:20 AM 03/01/2024    2:02 PM  GAD 7 : Generalized Anxiety Score  Nervous, Anxious, on Edge 1 1 1 1   Control/stop worrying 2 1 2 1   Worry too much - different things 2 1 2 1   Trouble relaxing 1 1 2 1   Restless 1 1 1 1   Easily annoyed or irritable 0 1 1 1   Afraid - awful might happen 0 0 0 0  Total GAD 7 Score 7 6 9 6   Anxiety Difficulty Somewhat difficult Somewhat difficult Somewhat difficult      Medications: Outpatient Medications Prior to Visit  Medication Sig   albuterol  (VENTOLIN  HFA) 108 (90 Base) MCG/ACT inhaler Inhale 2 puffs into the lungs every 6 (six) hours as needed for wheezing or shortness of breath.   amLODipine  (NORVASC ) 5 MG tablet TAKE 1 TABLET BY MOUTH EVERYDAY AT BEDTIME   aspirin  EC 81 MG tablet Take 81 mg by mouth in the morning. Swallow whole.   Atogepant  (QULIPTA ) 60 MG TABS Take 1 tablet by mouth daily.   atorvastatin  (LIPITOR) 40 MG tablet TAKE 1 TABLET BY MOUTH EVERY DAY IN THE EVENING   azithromycin  (ZITHROMAX ) 250 MG tablet Take 1 tablet (250 mg total) by mouth daily. (Patient not taking: Reported on 04/07/2024)   budeson-glycopyrrolate -formoterol  (BREZTRI  AEROSPHERE) 160-9-4.8 MCG/ACT AERO inhaler Inhale 2 puffs into the lungs in the morning and at bedtime.   clobetasol ointment (TEMOVATE) 0.05 % Apply 1 Application topically 2 (two) times daily.   clonazePAM  (KLONOPIN ) 2 MG tablet Take 1 tablet (2 mg total) by mouth 2 (two) times daily.   docusate sodium  (COLACE) 100 MG capsule Take 100 mg by mouth in the morning.   fenofibrate  (TRICOR ) 48 MG tablet Take 1 tablet (48 mg total) by mouth daily.   gabapentin  (NEURONTIN ) 600 MG tablet Take 600 mg by mouth 4 (four) times daily.   ipratropium-albuterol  (DUONEB) 0.5-2.5 (3)  MG/3ML SOLN INHALE 3 ML BY NEBULIZER EVERY 6 HOURS AS NEEDED   isosorbide  mononitrate (IMDUR ) 30 MG 24 hr tablet Take 1 tablet (30 mg total) by mouth daily.   lisinopril  (ZESTRIL ) 20 MG tablet Take 1 tablet (20 mg total) by mouth daily.   metoprolol  tartrate (LOPRESSOR ) 25 MG tablet TAKE ONE-HALF TABLET BY MOUTH  TWICE DAILY   naloxone  (NARCAN ) 4 MG/0.1ML LIQD nasal spray kit Place 1 spray into the nose as needed (opioid overdose).   nicotine  polacrilex (NICORETTE ) 4 MG gum Take 1 each (4 mg total) by mouth as needed for smoking cessation.   omeprazole  (PRILOSEC) 40 MG capsule Take 1 capsule (40 mg total) by mouth 2 (two)  times daily.   ondansetron  (ZOFRAN ) 8 MG tablet TAKE 1 TABLET BY MOUTH EVERY 8 HOURS AS NEEDED FOR NAUSEA OR VOMITING.   oxyCODONE  (ROXICODONE ) 15 MG immediate release tablet Take 15 mg by mouth every 4 (four) hours.   tiZANidine  (ZANAFLEX ) 4 MG tablet TAKE 1 TABLET BY MOUTH EVERY 8 HOURS AS NEEDED   VENTOLIN  HFA 108 (90 Base) MCG/ACT inhaler USE 1 TO 2 INHALATIONS BY MOUTH  INTO THE LUNGS EVERY 6 HOURS AS  NEEDED FOR SHORTNESS OF BREATH  OR WHEEZING (Patient not taking: Reported on 03/30/2024)   zolpidem  (AMBIEN ) 5 MG tablet Take 1 tablet (5 mg total) by mouth at bedtime as needed for sleep.   [DISCONTINUED] nicotine  (NICODERM CQ  - DOSED IN MG/24 HOURS) 14 mg/24hr patch Place 1 patch (14 mg total) onto the skin daily.   No facility-administered medications prior to visit.    Review of Systems  Last CBC Lab Results  Component Value Date   WBC 8.8 10/02/2023   HGB 14.0 10/02/2023   HCT 46.8 (H) 10/02/2023   MCV 79 10/02/2023   MCH 23.5 (L) 10/02/2023   RDW 27.8 (H) 10/02/2023   PLT 366 10/02/2023   Last metabolic panel Lab Results  Component Value Date   GLUCOSE 72 02/10/2024   NA 139 02/10/2024   K 4.9 02/10/2024   CL 101 02/10/2024   CO2 22 02/10/2024   BUN 13 02/10/2024   CREATININE 0.92 02/10/2024   EGFR 73 02/10/2024   CALCIUM  9.7 02/10/2024   PHOS 2.7  09/16/2023   PROT 7.1 09/21/2023   ALBUMIN  3.8 09/21/2023   LABGLOB 2.5 01/06/2023   AGRATIO 1.6 04/03/2022   BILITOT <0.1 09/21/2023   ALKPHOS 68 09/21/2023   AST 18 09/21/2023   ALT 12 09/21/2023   ANIONGAP 10 09/21/2023   Last hemoglobin A1c Lab Results  Component Value Date   HGBA1C 6.0 (H) 01/06/2023   Last thyroid  functions Lab Results  Component Value Date   TSH 1.060 01/06/2023   FREET4 0.99 01/06/2023   Last vitamin B12 and Folate Lab Results  Component Value Date   VITAMINB12 317 09/13/2023   FOLATE 24.0 09/13/2023        Objective    LMP 12/16/2017 (Approximate)   BP Readings from Last 3 Encounters:  03/30/24 108/62  03/22/24 (!) 140/85  03/10/24 (!) 117/90   Wt Readings from Last 3 Encounters:  04/07/24 192 lb (87.1 kg)  03/30/24 194 lb (88 kg)  03/22/24 192 lb 4.8 oz (87.2 kg)        Physical Exam  Physical Exam Vitals reviewed.  Constitutional:      General: She is not in acute distress.    Appearance: She is not ill-appearing.     Comments: Tired appearing, nontoxic appearing, in home seated   Pulmonary:     Effort: Pulmonary effort is normal. No respiratory distress.  Neurological:     Mental Status: She is alert and oriented to person, place, and time.  Psychiatric:        Mood and Affect: Mood normal.        Behavior: Behavior normal.        Thought Content: Thought content normal.        Assessment & Plan     Problem List Items Addressed This Visit     Anemia of chronic disease   Anemia of chronic disease Chronic condition  Reports weakness and lightheadedness, similar to previous episodes of low blood levels, suggesting possible  exacerbation. - Order labs to check hemoglobin, iron , and ferritin levels. - will check TSH,T4 and CMP  - Advise to come in for lab work between 8 AM and 11:30 AM or 1 PM to 4:30 PM.      Chronic obstructive pulmonary disease (HCC)   Chronic condition  COPD is a significant concern,  particularly with ongoing tobacco use. Smoking cessation is a priority to prevent further respiratory health deterioration. - Follow up with pulmonologist - Continue Dulera  100/5 mcg two puffs every morning - Continue Spiriva  2.5 mcg morning and bedtime - Continue albuterol  as needed      Relevant Medications   nicotine  (NICODERM CQ  - DOSED IN MG/24 HR) 7 mg/24hr patch   Impaired mobility   Mobility impairment Significant balance issues and recent falls, including a fall onto the couch due to lightheadedness and weakness. Suspects balance issues may be related to her spine and plans to discuss with her surgeon. - Ordered rolling walker with a seat to assist with mobility and prevent falls. - Check with medical assistant for updates on the rolling walker order and delivery status. - Send MyChart message with updates on walker delivery.      Smoker - Primary   Nicotine  dependence, current tobacco use Chronic condition  Currently smoking about half a pack of cigarettes per day and motivated to quit. Financial barriers with nicotine  replacement therapy due to insurance coverage issues. - Send prescription for 7 mg nicotine  replacement patches for a month's supply. - Advise to contact insurance for information on covered smoking cessation options. - Instruct on proper use of nicotine  patches (use during the day, remove for showering and sleeping).      Relevant Medications   nicotine  (NICODERM CQ  - DOSED IN MG/24 HR) 7 mg/24hr patch   Other Visit Diagnoses       Generalized weakness       Relevant Orders   CBC   CMP14+EGFR   TSH + free T4   Fe+TIBC+Fer       Assessment and Plan Assessment & Plan   Chronic pain (hip, knee, low back) Chronic pain in hip, knee, and low back, potentially contributing to mobility issues. Suspects a spinal component to her balance problems. - Discuss symptoms with surgeon during upcoming appointment on November 11.        Return in about 3  months (around 07/22/2024).     I discussed the assessment and treatment plan with the patient. The patient was provided an opportunity to ask questions and all were answered. The patient agreed with the plan and demonstrated an understanding of the instructions.   The patient was advised to call back or seek an in-person evaluation if the symptoms worsen or if the condition fails to improve as anticipated.  I provided 20 minutes of non-face-to-face time during this encounter.   Rockie Agent, MD [facility] [phone] (phone) [phone] (fax)  [facility]

## 2024-04-21 NOTE — Assessment & Plan Note (Signed)
 Nicotine  dependence, current tobacco use Chronic condition  Currently smoking about half a pack of cigarettes per day and motivated to quit. Financial barriers with nicotine  replacement therapy due to insurance coverage issues. - Send prescription for 7 mg nicotine  replacement patches for a month's supply. - Advise to contact insurance for information on covered smoking cessation options. - Instruct on proper use of nicotine  patches (use during the day, remove for showering and sleeping).

## 2024-04-25 ENCOUNTER — Other Ambulatory Visit: Payer: Self-pay | Admitting: Family Medicine

## 2024-04-25 DIAGNOSIS — I10 Essential (primary) hypertension: Secondary | ICD-10-CM

## 2024-05-03 ENCOUNTER — Telehealth: Payer: Self-pay

## 2024-05-10 ENCOUNTER — Ambulatory Visit

## 2024-05-10 ENCOUNTER — Ambulatory Visit: Admitting: Physician Assistant

## 2024-05-10 VITALS — BP 158/76 | Ht 64.0 in | Wt 189.0 lb

## 2024-05-10 DIAGNOSIS — G8929 Other chronic pain: Secondary | ICD-10-CM | POA: Diagnosis not present

## 2024-05-10 DIAGNOSIS — M546 Pain in thoracic spine: Secondary | ICD-10-CM

## 2024-05-10 DIAGNOSIS — Z981 Arthrodesis status: Secondary | ICD-10-CM

## 2024-05-10 DIAGNOSIS — M545 Low back pain, unspecified: Secondary | ICD-10-CM

## 2024-05-10 NOTE — Progress Notes (Unsigned)
 DOS: 07/10/2023, thoracic decompression and fusion  HISTORY OF PRESENT ILLNESS: 05/10/2024 [name] is 11 months status post thoracic decompression and fusion for myelopathy.  She comes in today for pain in the middle of her back extending down to her low back that has become progressively worse over the past couple of months.  She feels as though her legs are giving out more often and that she is weaker.  Indicates left leg is weaker than the right.  She does continue to have numbness and tingling as well in her lower extremities.  She does not feel as though her pain is under control.  No new incontinence or saddle anesthesia       PHYSICAL EXAMINATION:   Vitals:   05/10/24 1404  BP: (!) 158/76    General: Patient is well developed, well nourished, calm, collected, and in no apparent distress.  NEUROLOGICAL:  General: In no acute distress.  Awake, alert, oriented to person, place, and time. Pupils equal round and reactive to light.   Strength: Examination is baseline with largely full strength to lower extremities at least 4/5.2+ right patella, 1+ left, trace achilles.  No difficulty tandem gait.    ROS (Neurologic): Negative except as noted above  IMAGING: EXAM: CT CERVICAL SPINE WITHOUT CONTRAST   TECHNIQUE: Multidetector CT imaging of the cervical spine was performed without intravenous contrast. Multiplanar CT image reconstructions were also generated.   RADIATION DOSE REDUCTION: This exam was performed according to the departmental dose-optimization program which includes automated exposure control, adjustment of the mA and/or kV according to patient size and/or use of iterative reconstruction technique.   COMPARISON:  MRI of the cervical spine dated April 25, 2023.   FINDINGS: Alignment: Normal.   Skull base and vertebrae: Status post bilateral decompression laminectomies at C5 and C6, interbody fusion and anterior spinal fixation at C5-6 and  C6-7 and bilateral posterior-lateral spinal fixation of C5 through T1. The orthopedic hardware is intact and unremarkable. No fractures or osseous lesions are present.   Soft tissues and spinal canal: No masses, fluid collections or inflammatory changes.   Disc levels: There are mild bilateral facet arthritic changes present at C2-3, C3-4 and C4-5, without significant spinal canal or neural foraminal stenosis. The spinal canal and neural foramina appear widely patent at C5-6 and C6-7. The C7-T1 disc space level is also unremarkable.   Upper chest: The lung apices are clear.   Other: None.   IMPRESSION: 1. Status post posterior decompression, anterior fusion and bilateral posterior-lateral spinal fixation of C5 through C7. The spinal canal and neural foramina appear patent at the operative levels.  ASSESSMENT/PLAN:  Sara Munoz  is 11 months status post thoracic decompression and fusion for myelopathy.  She has been having increased pain, numbness and tingling in her back and lower extremities for the past several months.  Her examination is to baseline currently.   - Will make sure there is no hardware failure, x-rays of thoracic and lumbar spine to be completed today in clinic - Due to patient endorsing progressive pain, weakness, numbness and tingling we will obtain new MRI of thoracic and lumbar spine to evaluate for any worsening stenosis or encroachment on her cord. - If patient's MRI is benign, patient expressed interest in spinal cord stimulator placement.  She previously had a trial prior to myelopathy diagnosis and fusion and had a lot of relief with her pain.  Pending results, this may be a very good surgical option for her moving  forward. - Will follow-up once imaging is complete.      Lyle Decamp, [name] Department of Neurosurgery

## 2024-05-10 NOTE — Progress Notes (Signed)
 Pharmacy Quality Measure Review  This patient is appearing on a report for being at risk of failing the adherence measure for hypertension (ACEi/ARB) medications this calendar year.   Medication: lisinopril  Last fill date: 10/06/23 for 90 day supply (180 tablets) Of note, patient was taking BID previously and had some stock left at home.   Contacted pharmacy to facilitate refills.  Dario Yono E. Marsh, PharmD, CPP Clinical Pharmacist [facility] [phone]

## 2024-05-12 ENCOUNTER — Telehealth: Payer: Self-pay

## 2024-05-12 NOTE — Patient Instructions (Signed)
 Madelin JONELLE Kapur - I am sorry I was unable to reach you today for our scheduled appointment. I work with Sharma Coyer, MD and am calling to support your healthcare needs. Please contact me at [phone] at your earliest convenience. I look forward to speaking with you soon.   Thank you,  Nestora Duos, MSN, RN [facility]  [facility], [facility] RN Care Manager Direct Dial: [phone] Fax: [phone]

## 2024-05-16 ENCOUNTER — Other Ambulatory Visit: Payer: Self-pay | Admitting: Family Medicine

## 2024-05-16 DIAGNOSIS — R110 Nausea: Secondary | ICD-10-CM

## 2024-05-17 ENCOUNTER — Other Ambulatory Visit: Payer: Self-pay | Admitting: Family Medicine

## 2024-05-17 ENCOUNTER — Other Ambulatory Visit: Payer: Self-pay | Admitting: Pulmonary Disease

## 2024-05-17 DIAGNOSIS — F411 Generalized anxiety disorder: Secondary | ICD-10-CM

## 2024-05-17 DIAGNOSIS — I10 Essential (primary) hypertension: Secondary | ICD-10-CM

## 2024-05-17 DIAGNOSIS — J439 Emphysema, unspecified: Secondary | ICD-10-CM

## 2024-05-18 ENCOUNTER — Other Ambulatory Visit

## 2024-05-25 ENCOUNTER — Encounter

## 2024-05-26 ENCOUNTER — Inpatient Hospital Stay: Admission: RE | Admit: 2024-05-26 | Source: Ambulatory Visit

## 2024-05-26 ENCOUNTER — Other Ambulatory Visit

## 2024-05-31 ENCOUNTER — Ambulatory Visit: Admitting: Pulmonary Disease

## 2024-05-31 ENCOUNTER — Encounter

## 2024-06-03 ENCOUNTER — Other Ambulatory Visit: Payer: Self-pay

## 2024-06-03 NOTE — Patient Instructions (Signed)
 Madelin JONELLE Kapur - I am sorry I was unable to reach you today for our scheduled appointment. I work with Sharma Coyer, MD and am calling to support your healthcare needs. Please contact me at [phone] at your earliest convenience. I look forward to speaking with you soon.   Thank you,  Nestora Duos, MSN, RN [facility]  [facility], [facility] RN Care Manager Direct Dial: [phone] Fax: [phone]

## 2024-06-03 NOTE — Patient Instructions (Signed)
 Visit Information  Thank you for taking time to visit with me today. Please don't hesitate to contact me if I can be of assistance to you before our next scheduled appointment.  Your next care management appointment is by telephone on 07/01/2024 at 9:30 am  Telephone follow-up in 1 month  SMOKING RESOURCE: 1-800-QUIT-NOW  Please call the care guide team at [phone] if you need to cancel, schedule, or reschedule an appointment.   Please call the Suicide and Crisis Lifeline: 988 call the [place]  National Suicide Prevention Lifeline: 779-622-9179 or TTY: 651 558 7882 TTY 916-016-1645) to talk to a trained counselor call 1-800-273-TALK (toll free, 24 hour hotline) go to [facility] [address] [place] [phone]) call 911 if you are experiencing a Mental Health or Behavioral Health Crisis or need someone to talk to.  Nestora Duos, MSN, RN [facility], [facility] RN Care Manager Direct Dial: [phone] Fax: [phone]  Managing the Challenge of Quitting Smoking Quitting smoking is a physical and mental challenge. You may have cravings, withdrawal symptoms, and temptation to smoke. Before quitting, work with your health care provider to make a plan that can help you manage quitting. Making a plan before you quit may keep you from smoking when you have the urge to smoke while trying to quit. How to manage lifestyle changes Managing stress Stress can make you want to smoke, and wanting to smoke may cause stress. It is important to find ways to manage your stress. You could try some of the following: Practice relaxation techniques. Breathe slowly and deeply, in through your nose and out through your mouth. Listen to music. Soak in a bath or take a shower. Imagine a peaceful place or vacation. Get some support. Talk with family or friends about your stress. Join a support group. Talk with a counselor  or therapist. Get some physical activity. Go for a walk, run, or bike ride. Play a favorite sport. Practice yoga.  Medicines Talk with your health care provider about medicines that might help you deal with cravings and make quitting easier for you. Relationships Social situations can be difficult when you are quitting smoking. To manage this, you can: Avoid parties and other social situations where people might be smoking. Avoid alcohol . Leave right away if you have the urge to smoke. Explain to your family and friends that you are quitting smoking. Ask for support and let them know you might be a bit grumpy. Plan activities where smoking is not an option. General instructions Be aware that many people gain weight after they quit smoking. However, not everyone does. To keep from gaining weight, have a plan in place before you quit, and stick to the plan after you quit. Your plan should include: Eating healthy snacks. When you have a craving, it may help to: Eat popcorn, or try carrots, celery, or other cut vegetables. Chew sugar-free gum. Changing how you eat. Eat small portion sizes at meals. Eat 4-6 small meals throughout the day instead of 1-2 large meals a day. Be mindful when you eat. You should avoid watching television or doing other things that might distract you as you eat. Exercising regularly. Make time to exercise each day. If you do not have time for a long workout, do short bouts of exercise for 5-10 minutes several times a day. Do some form of strengthening exercise, such as weight lifting. Do some exercise that gets your heart beating and causes you to breathe deeply,  such as walking fast, running, swimming, or biking. This is very important. Drinking plenty of water  or other low-calorie or no-calorie drinks. Drink enough fluid to keep your urine pale yellow.  How to recognize withdrawal symptoms Your body and mind may experience discomfort as you try to get used to  not having nicotine  in your system. These effects are called withdrawal symptoms. They may include: Feeling hungrier than normal. Having trouble concentrating. Feeling irritable or restless. Having trouble sleeping. Feeling depressed. Craving a cigarette. These symptoms may surprise you, but they are normal to have when quitting smoking. To manage withdrawal symptoms: Avoid places, people, and activities that trigger your cravings. Remember why you want to quit. Get plenty of sleep. Avoid coffee and other drinks that contain caffeine. These may worsen some of your symptoms. How to manage cravings Come up with a plan for how to deal with your cravings. The plan should include the following: A definition of the specific situation you want to deal with. An activity or action you will take to replace smoking. A clear idea for how this action will help. The name of someone who could help you with this. Cravings usually last for 5-10 minutes. Consider taking the following actions to help you with your plan to deal with cravings: Keep your mouth busy. Chew sugar-free gum. Suck on hard candies or a straw. Brush your teeth. Keep your hands and body busy. Change to a different activity right away. Squeeze or play with a ball. Do an activity or a hobby, such as making bead jewelry, practicing needlepoint, or working with wood. Mix up your normal routine. Take a short exercise break. Go for a quick walk, or run up and down stairs. Focus on doing something kind or helpful for someone else. Call a friend or family member to talk during a craving. Join a support group. Contact a quitline. Where to find support To get help or find a support group: Call the National Cancer Institute's Smoking Quitline: 1-800-QUIT-NOW 2291832818) Text QUIT to SmokefreeTXT: 521151 Where to find more information Visit these websites to find more information on quitting smoking: U.S. Department of Health and Human  Services: www.smokefree.gov American Lung Association: www.freedomfromsmoking.org Centers for Disease Control and Prevention (CDC): footballexhibition.com.br American Heart Association: www.heart.org Contact a health care provider if: You want to change your plan for quitting. The medicines you are taking are not helping. Your eating feels out of control or you cannot sleep. You feel depressed or become very anxious. Summary Quitting smoking is a physical and mental challenge. You will face cravings, withdrawal symptoms, and temptation to smoke again. Preparation can help you as you go through these challenges. Try different techniques to manage stress, handle social situations, and prevent weight gain. You can deal with cravings by keeping your mouth busy (such as by chewing gum), keeping your hands and body busy, calling family or friends, or contacting a quitline for people who want to quit smoking. You can deal with withdrawal symptoms by avoiding places where people smoke, getting plenty of rest, and avoiding drinks that contain caffeine. This information is not intended to replace advice given to you by your health care provider. Make sure you discuss any questions you have with your health care provider. Document Revised: 06/01/2021 Document Reviewed: 06/01/2021 Elsevier Patient Education  2024 [employer].

## 2024-06-03 NOTE — Patient Outreach (Signed)
 Complex Care Management   Visit Note  06/03/2024  Name:  Sara Munoz MRN: [id number] DOB: 01/11/1966  Situation: Referral received for Complex Care Management related to COPD and Anxiety HTN I obtained verbal consent from Patient.  Visit completed with Patient  on the phone  Background:   Past Medical History:  Diagnosis Date   Anemia    Anxiety    Aortic atherosclerosis    Asthma    Atypical chest pain 08/02/2015   Carotid artery disease    Chronic back pain    Chronic nausea    Chronic, continuous use of opioids 02/27/2016   a.) has naloxone  Rx available   COPD (chronic obstructive pulmonary disease) (HCC)    Coronary artery disease    a.) LHC 2011: no sig CAD (report unavailable); b.) LHC 07/07/2014: 10-20% mLM, 25% LAD, 30-40% LCx, 20-30% RCA -- med mgmt; c.) LHC 02/22/2016: 25% LM, 25% pLCx --> med mgmt; d.) MPI 07/10/2017: no ischemia; e.) MPI 09/01/2019: no ischemia; f.) cCTA 10/28/2022: Ca score 1092 (99th percentile for age/sex/race match control)   Current smoker    DDD (degenerative disc disease), cervical    a.) s/p ACDF C5-C7 and PSIF C5-T1   DDD (degenerative disc disease), lumbar 05/23/2020   a.) s/p L5-S1 PLIF   GERD (gastroesophageal reflux disease)    Hepatic steatosis    Hepatitis C virus infection cured after antiviral drug therapy    a.) s/p Tx with standard glecaprevir-pibrentasvir course   Hypercholesteremia    Hyperkalemia    Hyperlipidemia    Hypertension    Hypokalemia    Hypomagnesemia 01/04/2014   Incomplete tear of left rotator cuff    Insomnia    a.) on hypnotic PRN (zolpidem )   MI (myocardial infarction) (HCC) 2012   Migraines    MSSA (methicillin susceptible Staphylococcus aureus) septicemia (HCC) 03/03/2014   a.) TEE (-) for valvular vegitation   Nerve pain    per patient, in the lower back   Osteoarthritis    Pneumonia    PTSD (post-traumatic stress disorder)    PVD (peripheral vascular disease)    Tendinitis of left rotator  cuff    TIA (transient ischemic attack) 2014    Assessment: Patient Reported Symptoms:  Cognitive Cognitive Status: No symptoms reported Cognitive/Intellectual Conditions Management [RPT]: None reported or documented in medical history or problem list   Health Maintenance Behaviors: Annual physical exam  Neurological Neurological Review of Symptoms: Headaches Neurological Comment: Qulipta  for HA - some improvement noted, making appointment to see neuro for FU  HEENT   HEENT Comment: see resp    Cardiovascular Cardiovascular Symptoms Reported: No symptoms reported Cardiovascular Management Strategies: Routine screening Weight: 184 lb (83.5 kg) Cardiovascular Comment: needs batteries for BP machine, no sx high BP,  Respiratory Respiratory Symptoms Reported: Dry cough, Shortness of breath, Wheezing Other Respiratory Symptoms: using albuterol  and neb with improvement of wheeze and SOB, does not feel sick, clear nasal congestion, aware when to call provider, reviewed red flags for ED    Endocrine      Gastrointestinal Gastrointestinal Symptoms Reported: Nausea Other Gastrointestinal Symptoms: nausea controlled with zofran       Genitourinary Genitourinary Symptoms Reported: No symptoms reported    Integumentary Integumentary Symptoms Reported: No symptoms reported    Musculoskeletal Musculoskelatal Symptoms Reviewed: Back pain, Muscle pain, Unsteady gait, Joint pain, Difficulty walking, Limited mobility, Weakness Additional Musculoskeletal Details: improvement with rollator - near falls due to legs going out biut no actual falls with rollator, MRIs  next week to determine surgery vs spinal stim - pain at best in back and neck 5/10 - worst 10/10, using oxycodone  regularly Musculoskeletal Management Strategies: Routine screening, Medication therapy Falls in the past year?: Yes Number of falls in past year: 1 or less Was there an injury with Fall?: Yes Fall Risk Category Calculator:  2 Patient Fall Risk Level: Moderate Fall Risk Patient at Risk for Falls Due to: History of fall(s), Impaired balance/gait, Impaired mobility, Orthopedic patient  Psychosocial Psychosocial Symptoms Reported: No symptoms reported Additional Psychological Details: continues smoking 1/2 pack daily - insurance not covering patch/gum Behavioral Management Strategies: Medication therapy        06/03/2024    PHQ2-9 Depression Screening   Little interest or pleasure in doing things Not at all  Feeling down, depressed, or hopeless Not at all  PHQ-2 - Total Score 0  Trouble falling or staying asleep, or sleeping too much    Feeling tired or having little energy    Poor appetite or overeating     Feeling bad about yourself - or that you are a failure or have let yourself or your family down    Trouble concentrating on things, such as reading the newspaper or watching television    Moving or speaking so slowly that other people could have noticed.  Or the opposite - being so fidgety or restless that you have been moving around a lot more than usual    Thoughts that you would be better off dead, or hurting yourself in some way    PHQ2-9 Total Score    If you checked off any problems, how difficult have these problems made it for you to do your work, take care of things at home, or get along with other people    Depression Interventions/Treatment      Today's Vitals   06/03/24 0952  Weight: 184 lb (83.5 kg)      Medications Reviewed Today   Medications were not reviewed in this encounter     Recommendation:   PCP Follow-up Continue Current Plan of Care  Follow Up Plan:   Telephone follow-up in 1 month  Nestora Duos, MSN, RN [facility]  [facility], [facility] RN Care Manager Direct Dial: [phone] Fax: [phone]

## 2024-06-06 ENCOUNTER — Other Ambulatory Visit: Payer: Self-pay | Admitting: Family Medicine

## 2024-06-06 DIAGNOSIS — E781 Pure hyperglyceridemia: Secondary | ICD-10-CM

## 2024-06-09 ENCOUNTER — Inpatient Hospital Stay
Admission: RE | Admit: 2024-06-09 | Discharge: 2024-06-09 | Attending: Physician Assistant | Admitting: Physician Assistant

## 2024-06-09 ENCOUNTER — Ambulatory Visit
Admission: RE | Admit: 2024-06-09 | Discharge: 2024-06-09 | Disposition: A | Discharge status: Home / Self Care | Source: Ambulatory Visit | Attending: Physician Assistant | Admitting: Physician Assistant

## 2024-06-09 DIAGNOSIS — Z981 Arthrodesis status: Secondary | ICD-10-CM

## 2024-06-09 DIAGNOSIS — G8929 Other chronic pain: Secondary | ICD-10-CM

## 2024-06-09 NOTE — Progress Notes (Deleted)
°  Cardiology Office Note   Date:  06/09/2024  ID:  Sara Munoz, DOB 1965-10-25, MRN [id number] PCP: Sharma Coyer, MD  [facility] Providers Cardiologist:  Redell Cave, MD { Click to update primary MD,subspecialty MD or APP then REFRESH:1}    History of Present Illness Sara Munoz is a 58 y.o. female ***  ROS: ***  Studies Reviewed      *** Risk Assessment/Calculations {Does this patient have ATRIAL FIBRILLATION?[phone]} No BP recorded.  {Refresh Note OR Click here to enter BP  :1}***       Physical Exam VS:  LMP 12/16/2017        Wt Readings from Last 3 Encounters:  06/03/24 184 lb (83.5 kg)  05/10/24 189 lb (85.7 kg)  04/07/24 192 lb (87.1 kg)    GEN: Well nourished, well developed in no acute distress NECK: No JVD; No carotid bruits CARDIAC: ***RRR, no murmurs, rubs, gallops RESPIRATORY:  Clear to auscultation without rales, wheezing or rhonchi  ABDOMEN: Soft, non-tender, non-distended EXTREMITIES:  No edema; No deformity   ASSESSMENT AND PLAN ***    {Are you ordering a CV Procedure (e.g. stress test, cath, DCCV, TEE, etc)?   Press F2        :[id number]}  Dispo: ***  Signed, Danaria Larsen, NP

## 2024-06-10 ENCOUNTER — Ambulatory Visit: Admitting: Cardiology

## 2024-06-13 ENCOUNTER — Telehealth: Payer: Self-pay | Admitting: Physician Assistant

## 2024-06-13 ENCOUNTER — Ambulatory Visit: Payer: Self-pay | Admitting: Physician Assistant

## 2024-06-13 NOTE — Telephone Encounter (Signed)
 Please call her and find out if she is still taking zanaflex . Looks like it was stopped at her last visit?   FYI- I hit sign order by mistake. I called pharmacy and called zanaflex  refill on 06/13/24.

## 2024-06-14 NOTE — Telephone Encounter (Signed)
 I spoke to patient and she states she is not taking Tizanidine  any more.

## 2024-06-14 NOTE — Progress Notes (Deleted)
 " Cardiology Office Note   Date: 06/14/2024  ID:  KENADY DOXTATER 07-Nov-1965 [id number] PCP: Sharma Coyer, MD  [facility] Providers Cardiologist: Redell Cave, MD { Click to update primary MD,subspecialty MD or APP then REFRESH:1}    Chief Complaint: Sara Munoz is a 58 y.o.female with PMH of non-obstructive CAD on coronary CTA 10/28/2022, hypertension, hyperlipidemia, smoker, COPD, anxiety, chronic back pain who presents to the clinic for routine follow-up.    Ms. Ciliberto establish care in [facility] 06/2022 after having chest pain for around 6 months.  Had previous heart catheterization in 2017 showing nonobstructive CAD in left main and LCx.  Stress test in 2019 and again in 2021 low risk.  Echo 09/2022 LVEF 60-65%, mild LVH, mild MR.  10/28/2022 coronary calcium  score 1092 (99th percentile), but only mild non-obstructive CAD LM/prox LAD/LCx.   Admitted 08/2023 with symptomatic anemia of unclear etiology. Outpatient management notable for IV iron  and B12 injections as well as PPI with return of normal hemoglobin. Seen in the office in June and was doing well with intermittent breakthrough chest discomfort that was improved on Imdur , this was increased.    History of Present Illness: Today ***  ??meds, smoking how much, pulmonology, chest pain, SOB, BP at home, diet, exercise  Non-obstructive CAD: Coronary CTA 10/28/2022 with mild non-obstructive CAD to LM and LCx. Notes improved chest discomfort with Imdur . - Continue aspirin  81 mg daily - Continue atorvastatin  40 mg daily and fenofibrate  48 mg daily - Continue Imdur  30 mg daily  Hyperlipidemia, goal LDL < 70: 02/10/2024 LDL 105, HDL 38, TGs 179, total 174. Significant improvement in TGs with addition of fenofibrate . LDL not at goal. - ???fasting lipids vs direct LDL today - increase atorvastatin  to 80 mg pending LDL - Continue atorvastatin  40 mg daily and fenofibrate  48 mg daily   Hypertension: BP today *** -  Continue amlodipine  5 mg daily  - Continue lisinopril  20 mg daily - Continue metoprolol  tartrate 12.5 mg BID  - Continue Imdur  30 mg daily  Tobacco use: Smoking *** - Cessation recommended  ROS: Please see the history of present illness. All other systems reviewed and are negative.   Studies Reviewed: The following studies were personally reviewed today ***:      EKG Interpretation Date/Time:    Ventricular Rate:    PR Interval:    QRS Duration:    QT Interval:    QTC Calculation:   R Axis:      Text Interpretation:      Risk Assessment/Calculations:  {Does this patient have ATRIAL FIBRILLATION?:[phone]}  No BP recorded.  {Refresh Note OR Click here to enter BP  :1}***           Physical Exam: VS: LMP 12/16/2017  Wt Readings from Last 3 Encounters:  06/03/24 184 lb (83.5 kg)  05/10/24 189 lb (85.7 kg)  04/07/24 192 lb (87.1 kg)     GEN: *** Well nourished, in NAD HEENT: Normal NECK: No JVD, no carotid bruits LYMPHATICS: No lymphadenopathy CARDIAC: ***RRR, no murmurs, rubs, gallops RESPIRATORY: Clear to auscultation without rales, wheezing or rhonchi  ABDOMEN: Soft, non-tender, non-distended MUSCULOSKELETAL: No edema, no deformity  SKIN: Warm and dry NEUROLOGIC:  Alert and oriented x 3 PSYCHIATRIC:  Normal affect   Assessment & Plan: ***    {Are you ordering a CV Procedure (e.g. stress test, cath, DCCV, TEE, etc)?   Press F2        :[id number]}   Dispo: ***  Signed,  Saddie GORMAN Cleaves, NP 06/14/2024 9:00 PM  [facility] "

## 2024-06-16 ENCOUNTER — Ambulatory Visit: Admitting: Cardiology

## 2024-06-28 ENCOUNTER — Ambulatory Visit: Admitting: Family Medicine

## 2024-06-28 ENCOUNTER — Ambulatory Visit: Payer: Self-pay

## 2024-06-28 NOTE — Telephone Encounter (Signed)
 FYI Only or Action Required?: FYI only for provider: virtual urgent care scheduled 06/29/24, added to clinic wait list for virtual visit. Missed clinic appointment today due to transportation issue.  Patient was last seen in primary care on 04/21/2024 by Sharma Coyer, MD.  Called Nurse Triage reporting Appointment and Cough.  Symptoms began 2 weeks ago.  Interventions attempted: Prescription medications: rescue inhaler and Rest, hydration, or home remedies.  Symptoms are: unchanged.  Triage Disposition: See Physician Within 24 Hours scheduled virtual urgent care on 06/29/24  Patient/caregiver understands and will follow disposition?: Yes  Reason for Disposition  [1] Known COPD or other severe lung disease (i.e., bronchiectasis, cystic fibrosis, lung surgery) AND [2] symptoms getting worse (i.e., increased sputum purulence or amount, increased breathing difficulty  Answer Assessment - Initial Assessment Questions Additional info: Patient called to rescheduled appointment missed today due to transportation issue. She is requesting to schedule virtual. No appointments are available, scheduled virtual urgent care on 06/29/24, added appointment to clinic wait list for virtual clinic appointment.   1. ONSET: When did the cough begin?      2 weeks  2. SEVERITY: How bad is the cough today?      Moderate  3. SPUTUM: Describe the color of your sputum (e.g., none, dry cough; clear, white, yellow, green)     Yellowish/green 4. HEMOPTYSIS: Are you coughing up any blood? If Yes, ask: How much? (e.g., flecks, streaks, tablespoons, etc.)     denies 5. DIFFICULTY BREATHING: Are you having difficulty breathing? If Yes, ask: How bad is it? (e.g., mild, moderate, severe)      COPD-sob-rescue inhaler helpful.  6. FEVER: Do you have a fever? If Yes, ask: What is your temperature, how was it measured, and when did it start?     Denies  7. CARDIAC HISTORY: Do you have any history  of heart disease? (e.g., heart attack, congestive heart failure)       8. LUNG HISTORY: Do you have any history of lung disease?  (e.g., pulmonary embolus, asthma, emphysema)     COPD 9. PE RISK FACTORS: Do you have a history of blood clots? (or: recent major surgery, recent prolonged travel, bedridden)      10. OTHER SYMPTOMS: Do you have any other symptoms? (e.g., runny nose, wheezing, chest pain)       Body aches, patient suspicious for bronichitis  Protocols used: Cough - Acute Productive-A-AH Copied from CRM #[id number]. Topic: Clinical - Red Word Triage >> Jun 28, 2024 11:36 AM Victoria A wrote: [facility] that prompted transfer to Nurse Triage: Patient is having SOB, body aches, and headaches, and coughing. Patient missed today's appointment due to transportation and would like a virtual appt if available

## 2024-06-28 NOTE — Telephone Encounter (Signed)
 PT Telephone encounter reviewed.

## 2024-06-29 ENCOUNTER — Telehealth: Admitting: Family Medicine

## 2024-06-29 ENCOUNTER — Other Ambulatory Visit: Payer: Self-pay

## 2024-06-29 DIAGNOSIS — J208 Acute bronchitis due to other specified organisms: Secondary | ICD-10-CM

## 2024-06-29 MED ORDER — PREDNISONE 20 MG PO TABS: 40.0000 mg | ORAL_TABLET | Freq: Every day | ORAL | 0 refills | Status: AC

## 2024-06-29 MED ORDER — BENZONATATE 100 MG PO CAPS: 100.0000 mg | ORAL_CAPSULE | Freq: Three times a day (TID) | ORAL | 0 refills | Status: DC | PRN

## 2024-06-29 NOTE — Progress Notes (Signed)
 " Virtual Visit Consent   Sara Munoz, you are scheduled for a virtual visit with a [facility] provider today. Just as with appointments in the office, your consent must be obtained to participate. Your consent will be active for this visit and any virtual visit you may have with one of our providers in the next 365 days. If you have a MyChart account, a copy of this consent can be sent to you electronically.  As this is a virtual visit, video technology does not allow for your provider to perform a traditional examination. This may limit your provider's ability to fully assess your condition. If your provider identifies any concerns that need to be evaluated in person or the need to arrange testing (such as labs, EKG, etc.), we will make arrangements to do so. Although advances in technology are sophisticated, we cannot ensure that it will always work on either your end or our end. If the connection with a video visit is poor, the visit may have to be switched to a telephone visit. With either a video or telephone visit, we are not always able to ensure that we have a secure connection.  By engaging in this virtual visit, you consent to the provision of healthcare and authorize for your insurance to be billed (if applicable) for the services provided during this visit. Depending on your insurance coverage, you may receive a charge related to this service.  I need to obtain your verbal consent now. Are you willing to proceed with your visit today? Sara Munoz has provided verbal consent on 06/29/2024 for a virtual visit (video or telephone). Chiquita CHRISTELLA Barefoot, NP  Date: 06/29/2024 8:17 AM   Virtual Visit via Video Note   I, Chiquita CHRISTELLA Barefoot, connected with  Sara Munoz  ([id number], [birth year]) on 06/29/2024 at  8:15 AM EST by a video-enabled telemedicine application and verified that I am speaking with the correct person using two identifiers.  Location: Patient: Virtual Visit Location Patient:  Home Provider: Virtual Visit Location Provider: Home Office   I discussed the limitations of evaluation and management by telemedicine and the availability of in person appointments. The patient expressed understanding and agreed to proceed.    History of Present Illness: Sara Munoz is a 59 y.o. who identifies as a female who was assigned female at birth, and is being seen today for cough- over 2 weeks. Current smoker- with chronic cough/COPD and bronchitis.  Cough is most dry, but at times and when it first started it was more wet. No active mucus currently, but cough is causing her to have shortness of breath- a little more than her baseline, is having trouble sleeping now. Also reports some headaches (but this is nothing new), also has body aches- no fever she is aware of. Using Rx inhalers, rest, hydration.  She reports she thinks she had a cold but it now is lingering with the cough.  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   Other intervertebral disc degeneration, thoracic region 06/05/2023   Primary insomnia 06/03/2023   Myelopathy concurrent with and due to spinal stenosis of thoracic region [facility])  05/01/2023   Iron  deficiency anemia 04/21/2023   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   History of lumbar fusion 03/18/2023   S/P cervical spinal fusion 03/18/2023   Tendinitis of upper biceps tendon of right shoulder 01/17/2023   Rotator cuff tendinitis, right 01/17/2023   Chronic fatigue 01/06/2023   Prediabetes 01/06/2023   Status post left rotator cuff repair 12/04/2022   Foreign body left in  shoulder 12/04/2022   Atopic dermatitis of both hands 09/12/2022   Headache disorder 08/13/2022   Nontraumatic incomplete tear of left rotator cuff 05/28/2022   Spondylolisthesis 04/19/2022   Encounter for annual physical examination excluding gynecological examination in a patient older than 17 years 04/03/2022   Essential hypertension 03/11/2022   Lumbar spondylosis 02/12/2022   Chronic migraine w/o aura w/o status migrainosus, not intractable 07/09/2021   Obstructive sleep apnea 07/09/2021   Body mass index (BMI) 32.0-32.9, adult 06/07/2021   Pseudoarthrosis of cervical spine (HCC) 05/14/2021   Lumbar radiculopathy 05/23/2020   Sacroiliac inflammation 11/25/2019   Sepsis (HCC) 10/19/2018   Severe sepsis (HCC) 10/19/2018   Cervical radiculopathy 02/12/2018   Chronic migraine 02/12/2018   Paresthesia 08/29/2017   Neck pain 08/29/2017   Daily headache 08/29/2017   Chronic active hepatitis (HCC) 07/29/2017   Neurogenic pain 07/31/2016   Chronic pain syndrome 06/05/2016   Carotid artery stenosis 02/27/2016   Chronic constipation 02/27/2016   Chronic nausea 02/27/2016   Hypercholesterolemia 02/27/2016   Osteoarthritis 02/27/2016   PTSD (post-traumatic stress disorder) 02/27/2016   TIA (transient ischemic attack) 02/27/2016   Long term current use of opiate analgesic 02/27/2016   Long term prescription opiate use 02/27/2016   Encounter for pain management consult 02/27/2016   Chronic hip pain (Location of Tertiary source of pain) (Bilateral) (L>R) 02/27/2016   Chronic knee pain (Bilateral) (L>R) 02/27/2016   Chronic shoulder pain (Bilateral) (L>R) 02/27/2016   Chronic sacroiliac joint pain (Bilateral) (L>R) 02/27/2016   Chronic low back pain (Location of Primary Source of Pain) (Bilateral) (L>R) 02/27/2016   Chronic lower extremity pain (Location of Secondary source of pain) (Bilateral) (L>R) 02/27/2016   Osteoarthritis of hip (Bilateral) (L>R) 02/27/2016   Chronic neck pain  (posterior midline) (Bilateral) (L>R) 02/27/2016   Cervicogenic headache (Bilateral) (L>R) 02/27/2016   Occipital headache (Bilateral) (L>R) 02/27/2016   Chronic upper extremity pain (Bilateral) (L>R) 02/27/2016   Chronic Cervical radicular pain (Bilateral) (L>R) 02/27/2016   Chronic lumbar radicular pain (Right) (L5 dermatome) 02/27/2016   Lumbar facet syndrome (Bilateral) (L>R) 02/27/2016   Long term prescription benzodiazepine use 02/27/2016   Hypertension    Chronic obstructive pulmonary disease (HCC) 10/09/2015   GERD (gastroesophageal reflux disease) 10/09/2015   Non-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/2014 08/02/2015   Hyperlipidemia 08/02/2015   Costochondritis 08/02/2015   Drug abuse, 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   GAD (generalized anxiety disorder) 09/03/2014   Narcotic dependence (HCC) 09/03/2014   PVD (peripheral vascular disease) 09/03/2014   Smoker 09/03/2014   Cigarette nicotine  dependence with nicotine -induced disorder 07/08/2014   Anemia of chronic disease 03/19/2014   Polysubstance abuse (HCC) 03/19/2014   MSSA (methicillin susceptible Staphylococcus aureus) septicemia (HCC) 03/07/2014   Hypomagnesemia 01/04/2014   Chronic cough 09/04/2011   Sleep apnea, obstructive 09/04/2011   Sinusitis, acute 09/04/2011    Allergies: Allergies[1] Medications: Current Medications[2]  Observations/Objective: Patient is well-developed, well-nourished in no acute distress.  Resting comfortably  at home.  Head is normocephalic, atraumatic.  No labored breathing.  Speech is clear and coherent with logical content.  Patient is  alert and oriented at baseline.  Congestion tone  Cough present  Assessment and Plan:  1. Viral bronchitis (Primary)  - predniSONE  (DELTASONE ) 20 MG tablet; Take 2 tablets (40 mg total) by mouth daily with breakfast for 5 days.  Dispense: 10 tablet; Refill: 0 - benzonatate  (TESSALON ) 100 MG capsule; Take 1-2 capsules (100-200 mg total) by mouth 3 (three) times daily  as needed for cough.  Dispense: 30 capsule; Refill: 0  - Take meds as prescribed - Rest voice - Use a cool mist humidifier especially during the winter months when heat dries out the air. - Use saline nose sprays frequently to help soothe nasal passages if they are drying out. - Stay hydrated by drinking plenty of fluids - Keep thermostat turn down low to prevent drying out which can cause a dry cough. - For any cough or congestion- robitussin DM or Delsym as needed   If you do not improve you will need a follow up visit in person.               Reviewed side effects, risks and benefits of medication.    Patient acknowledged agreement and understanding of the plan.   Past Medical, Surgical, Social History, Allergies, and Medications have been Reviewed.     Follow Up Instructions: I discussed the assessment and treatment plan with the patient. The patient was provided an opportunity to ask questions and all were answered. The patient agreed with the plan and demonstrated an understanding of the instructions.  A copy of instructions were sent to the patient via MyChart unless otherwise noted below.    The patient was advised to call back or seek an in-person evaluation if the symptoms worsen or if the condition fails to improve as anticipated.    Chiquita CHRISTELLA Barefoot, NP     [1]  Allergies Allergen Reactions   Cyclobenzaprine  Hives, Swelling and Rash    Facial/lip swelling      Levofloxacin Hives, Swelling and Rash   Chlorpheniramine Maleate     Other reaction(s): Unknown   Phenergan  [Promethazine  Hcl] Other (See Comments)    Agitation.    Phenylephrine  Hcl Other (See Comments)    unknown   Toradol  [Ketorolac  Tromethamine ] Swelling and Other (See Comments)    Facial/tongue swelling    Zoloft [Sertraline Hcl] Swelling    Tongue swelling      Cephalexin  Hives and Rash   Ketorolac  Rash   Tramadol Hives, Swelling, Other (See Comments) and Rash    Lip swelling   [2]   Current Outpatient Medications:    albuterol  (VENTOLIN  HFA) 108 (90 Base) MCG/ACT inhaler, Inhale 2 puffs into the lungs every 6 (six) hours as needed for wheezing or shortness of breath., Disp: 8 g, Rfl: 2   amLODipine  (NORVASC ) 5 MG tablet, TAKE 1 TABLET BY MOUTH EVERYDAY AT BEDTIME, Disp: 90 tablet, Rfl: 1   aspirin  EC 81 MG tablet, Take 81 mg by mouth in the morning. Swallow whole., Disp: , Rfl:    Atogepant  (QULIPTA ) 60 MG TABS, Take 1 tablet by mouth daily., Disp: , Rfl:    atorvastatin  (LIPITOR) 40 MG tablet, TAKE 1 TABLET BY MOUTH EVERY DAY IN THE EVENING, Disp: 90 tablet, Rfl: 3   budeson-glycopyrrolate -formoterol  (BREZTRI  AEROSPHERE) 160-9-4.8 MCG/ACT AERO inhaler, Inhale 2 puffs into the lungs in the morning and at bedtime., Disp: 3 each, Rfl: 3   clobetasol ointment (TEMOVATE) 0.05 %, Apply 1 Application topically 2 (two) times daily., Disp: 30 g, Rfl: 0  clonazePAM  (KLONOPIN ) 2 MG tablet, TAKE 1 TABLET BY MOUTH 2 TIMES DAILY., Disp: 60 tablet, Rfl: 2   docusate sodium  (COLACE) 100 MG capsule, Take 100 mg by mouth in the morning., Disp: , Rfl:    fenofibrate  (TRICOR ) 48 MG tablet, TAKE 1 TABLET BY MOUTH EVERY DAY, Disp: 90 tablet, Rfl: 2   gabapentin  (NEURONTIN ) 600 MG tablet, Take 600 mg by mouth 4 (four) times daily., Disp: , Rfl:    ipratropium-albuterol  (DUONEB) 0.5-2.5 (3) MG/3ML SOLN, INHALE 3 ML BY NEBULIZER EVERY 6 HOURS AS NEEDED, Disp: 180 mL, Rfl: 11   isosorbide  mononitrate (IMDUR ) 30 MG 24 hr tablet, Take 1 tablet (30 mg total) by mouth daily., Disp: 90 tablet, Rfl: 3   lisinopril  (ZESTRIL ) 20 MG tablet, Take 1 tablet (20 mg total) by mouth daily., Disp: 90 tablet, Rfl: 3   metoprolol  tartrate (LOPRESSOR ) 25 MG tablet, TAKE ONE-HALF TABLET BY MOUTH  TWICE DAILY, Disp: 90 tablet, Rfl: 1   naloxone  (NARCAN ) 4 MG/0.1ML LIQD nasal spray kit, Place 1 spray into the nose as needed (opioid overdose)., Disp: , Rfl:    nicotine  (NICODERM CQ  - DOSED IN MG/24 HR) 7 mg/24hr patch,  Place 1 patch (7 mg total) onto the skin daily. (Patient not taking: Reported on 06/03/2024), Disp: 30 patch, Rfl: 5   nicotine  polacrilex (NICORETTE ) 4 MG gum, Take 1 each (4 mg total) by mouth as needed for smoking cessation. (Patient not taking: Reported on 06/03/2024), Disp: 100 tablet, Rfl: 3   omeprazole  (PRILOSEC) 40 MG capsule, Take 1 capsule (40 mg total) by mouth 2 (two) times daily., Disp: 180 capsule, Rfl: 3   ondansetron  (ZOFRAN ) 8 MG tablet, TAKE 1 TABLET BY MOUTH EVERY 8 HOURS AS NEEDED FOR NAUSEA OR VOMITING., Disp: 90 tablet, Rfl: 1   oxyCODONE  (ROXICODONE ) 15 MG immediate release tablet, Take 15 mg by mouth every 4 (four) hours., Disp: , Rfl:    VENTOLIN  HFA 108 (90 Base) MCG/ACT inhaler, USE 1 TO 2 INHALATIONS BY MOUTH  INTO THE LUNGS EVERY 6 HOURS AS  NEEDED FOR SHORTNESS OF BREATH  OR WHEEZING, Disp: 72 g, Rfl: 2   zolpidem  (AMBIEN ) 5 MG tablet, Take 1 tablet (5 mg total) by mouth at bedtime as needed for sleep., Disp: 30 tablet, Rfl: 0  "

## 2024-06-29 NOTE — Patient Instructions (Signed)
 Visit Information  Thank you for taking time to visit with me today. Please don't hesitate to contact me if I can be of assistance to you before our next scheduled appointment.  Your next care management appointment is by telephone on 07/27/2024  at 3:00 pm  Telephone follow-up in 1 month  Please call the care guide team at [phone] if you need to cancel, schedule, or reschedule an appointment.   Please call the Suicide and Crisis Lifeline: 988 call the [place]  National Suicide Prevention Lifeline: (303)182-6420 or TTY: 272-836-7818 TTY (512) 118-2168) to talk to a trained counselor call 1-800-273-TALK (toll free, 24 hour hotline) go to [facility] [address], [place] [id number]) call 911 if you are experiencing a Mental Health or Behavioral Health Crisis or need someone to talk to.  Nestora Duos, MSN, RN [place]  [facility], [facility] RN Care Manager Direct Dial: [phone] Fax: [phone]  Managing the Challenge of Quitting Smoking Quitting smoking is a physical and mental challenge. You may have cravings, withdrawal symptoms, and temptation to smoke. Before quitting, work with your health care provider to make a plan that can help you manage quitting. Making a plan before you quit may keep you from smoking when you have the urge to smoke while trying to quit. How to manage lifestyle changes Managing stress Stress can make you want to smoke, and wanting to smoke may cause stress. It is important to find ways to manage your stress. You could try some of the following: Practice relaxation techniques. Breathe slowly and deeply, in through your nose and out through your mouth. Listen to music. Soak in a bath or take a shower. Imagine a peaceful place or vacation. Get some support. Talk with family or friends about your stress. Join a support group. Talk with a counselor or therapist. Get some physical  activity. Go for a walk, run, or bike ride. Play a favorite sport. Practice yoga.  Medicines Talk with your health care provider about medicines that might help you deal with cravings and make quitting easier for you. Relationships Social situations can be difficult when you are quitting smoking. To manage this, you can: Avoid parties and other social situations where people might be smoking. Avoid alcohol . Leave right away if you have the urge to smoke. Explain to your family and friends that you are quitting smoking. Ask for support and let them know you might be a bit grumpy. Plan activities where smoking is not an option. General instructions Be aware that many people gain weight after they quit smoking. However, not everyone does. To keep from gaining weight, have a plan in place before you quit, and stick to the plan after you quit. Your plan should include: Eating healthy snacks. When you have a craving, it may help to: Eat popcorn, or try carrots, celery, or other cut vegetables. Chew sugar-free gum. Changing how you eat. Eat small portion sizes at meals. Eat 4-6 small meals throughout the day instead of 1-2 large meals a day. Be mindful when you eat. You should avoid watching television or doing other things that might distract you as you eat. Exercising regularly. Make time to exercise each day. If you do not have time for a long workout, do short bouts of exercise for 5-10 minutes several times a day. Do some form of strengthening exercise, such as weight lifting. Do some exercise that gets your heart beating and causes you to breathe deeply, such as walking  fast, running, swimming, or biking. This is very important. Drinking plenty of water  or other low-calorie or no-calorie drinks. Drink enough fluid to keep your urine pale yellow.  How to recognize withdrawal symptoms Your body and mind may experience discomfort as you try to get used to not having nicotine  in your system.  These effects are called withdrawal symptoms. They may include: Feeling hungrier than normal. Having trouble concentrating. Feeling irritable or restless. Having trouble sleeping. Feeling depressed. Craving a cigarette. These symptoms may surprise you, but they are normal to have when quitting smoking. To manage withdrawal symptoms: Avoid places, people, and activities that trigger your cravings. Remember why you want to quit. Get plenty of sleep. Avoid coffee and other drinks that contain caffeine. These may worsen some of your symptoms. How to manage cravings Come up with a plan for how to deal with your cravings. The plan should include the following: A definition of the specific situation you want to deal with. An activity or action you will take to replace smoking. A clear idea for how this action will help. The name of someone who could help you with this. Cravings usually last for 5-10 minutes. Consider taking the following actions to help you with your plan to deal with cravings: Keep your mouth busy. Chew sugar-free gum. Suck on hard candies or a straw. Brush your teeth. Keep your hands and body busy. Change to a different activity right away. Squeeze or play with a ball. Do an activity or a hobby, such as making bead jewelry, practicing needlepoint, or working with wood. Mix up your normal routine. Take a short exercise break. Go for a quick walk, or run up and down stairs. Focus on doing something kind or helpful for someone else. Call a friend or family member to talk during a craving. Join a support group. Contact a quitline. Where to find support To get help or find a support group: Call the National Cancer Institute's Smoking Quitline: 1-800-QUIT-NOW 313-741-6612) Text QUIT to SmokefreeTXT: 521151 Where to find more information Visit these websites to find more information on quitting smoking: U.S. Department of Health and Human Services:  www.smokefree.gov American Lung Association: www.freedomfromsmoking.org Centers for Disease Control and Prevention (CDC): footballexhibition.com.br American Heart Association: www.heart.org Contact a health care provider if: You want to change your plan for quitting. The medicines you are taking are not helping. Your eating feels out of control or you cannot sleep. You feel depressed or become very anxious. Summary Quitting smoking is a physical and mental challenge. You will face cravings, withdrawal symptoms, and temptation to smoke again. Preparation can help you as you go through these challenges. Try different techniques to manage stress, handle social situations, and prevent weight gain. You can deal with cravings by keeping your mouth busy (such as by chewing gum), keeping your hands and body busy, calling family or friends, or contacting a quitline for people who want to quit smoking. You can deal with withdrawal symptoms by avoiding places where people smoke, getting plenty of rest, and avoiding drinks that contain caffeine. This information is not intended to replace advice given to you by your health care provider. Make sure you discuss any questions you have with your health care provider. Document Revised: 06/01/2021 Document Reviewed: 06/01/2021 Elsevier Patient Education  2024 [employer].

## 2024-06-29 NOTE — Patient Outreach (Signed)
 Complex Care Management   Visit Note  06/29/2024  Name:  Sara Munoz MRN: [id number] DOB: [birth year]  Situation: Referral received for Complex Care Management related to COPD and Anxiety/HTN I obtained verbal consent from Patient.  Visit completed with Patient  on the phone  Background:   Past Medical History:  Diagnosis Date   Anemia    Anxiety    Aortic atherosclerosis    Asthma    Atypical chest pain 08/02/2015   Carotid artery disease    Chronic back pain    Chronic nausea    Chronic, continuous use of opioids 02/27/2016   a.) has naloxone  Rx available   COPD (chronic obstructive pulmonary disease) (HCC)    Coronary artery disease    a.) LHC 2011: no sig CAD (report unavailable); b.) LHC 07/07/2014: 10-20% mLM, 25% LAD, 30-40% LCx, 20-30% RCA -- med mgmt; c.) LHC 02/22/2016: 25% LM, 25% pLCx --> med mgmt; d.) MPI 07/10/2017: no ischemia; e.) MPI 09/01/2019: no ischemia; f.) cCTA 10/28/2022: Ca score 1092 (99th percentile for age/sex/race match control)   Current smoker    DDD (degenerative disc disease), cervical    a.) s/p ACDF C5-C7 and PSIF C5-T1   DDD (degenerative disc disease), lumbar 05/23/2020   a.) s/p L5-S1 PLIF   GERD (gastroesophageal reflux disease)    Hepatic steatosis    Hepatitis C virus infection cured after antiviral drug therapy    a.) s/p Tx with standard glecaprevir-pibrentasvir course   Hypercholesteremia    Hyperkalemia    Hyperlipidemia    Hypertension    Hypokalemia    Hypomagnesemia 01/04/2014   Incomplete tear of left rotator cuff    Insomnia    a.) on hypnotic PRN (zolpidem )   MI (myocardial infarction) (HCC) 2012   Migraines    MSSA (methicillin susceptible Staphylococcus aureus) septicemia (HCC) 03/03/2014   a.) TEE (-) for valvular vegitation   Nerve pain    per patient, in the lower back   Osteoarthritis    Pneumonia    PTSD (post-traumatic stress disorder)    PVD (peripheral vascular disease)    Tendinitis of left rotator cuff     TIA (transient ischemic attack) 2014    Assessment: Patient Reported Symptoms:  Cognitive Cognitive Status: No symptoms reported Cognitive/Intellectual Conditions Management [RPT]: None reported or documented in medical history or problem list   Health Maintenance Behaviors: Annual physical exam  Neurological Neurological Review of Symptoms: Dizziness Neurological Comment: reports still with headaches, calling neuro this week, dizziness at times - thinks might be anemic again and requesting labs from PCP for 1/29 appointemnt - RNCM will send message  HEENT        Cardiovascular Cardiovascular Symptoms Reported: No symptoms reported    Respiratory Respiratory Symptoms Reported: Productive cough, Shortness of breath, Wheezing Other Respiratory Symptoms: states ill for several Sara Munoz, gets better than flares again, nasal congestion, productive cough yellow sputum no fever sx and SOB at times but not severe - aware of red flags for ED,  had virtual visit today and prescribed tessalon  pearls and prednisone , states left message asking about antibiotic (nothing seen in notes re abx), using inhaler Additional Respiratory Details: reports smoking 4 cigarettes a day - positive feedback given and encouraged to not increase once feeling better as it is requirement for back surgery to quit smoking Respiratory Management Strategies: Routine screening, Medication therapy, Breathing exercise  Endocrine Endocrine Symptoms Reported: Not assessed Is patient diabetic?: No    Gastrointestinal Gastrointestinal Symptoms Reported: Nausea  Genitourinary Genitourinary Symptoms Reported: Not assessed    Integumentary Integumentary Symptoms Reported: Not assessed    Musculoskeletal Additional Musculoskeletal Details: back pain 7/10 using oxycodone , gets up to 9-10/10, best is 5/10, rollator at all times no new falls, back surgoen next week, needs to quit smoking - discussed   Falls in the past year?:  Yes Number of falls in past year: 1 or less Patient at Risk for Falls Due to: History of fall(s), Impaired balance/gait, Impaired mobility, Orthopedic patient Fall risk Follow up: Falls evaluation completed, Education provided  Psychosocial Psychosocial Symptoms Reported: No symptoms reported Additional Psychological Details: down to 4 cigarettes/day with illness, encouraged to continue once feeling better and sowly reduce till quit so can have back surgery Behavioral Management Strategies: Medication therapy        06/29/2024    PHQ2-9 Depression Screening   Little interest or pleasure in doing things Several days  Feeling down, depressed, or hopeless Not at all  PHQ-2 - Total Score 1  Trouble falling or staying asleep, or sleeping too much    Feeling tired or having little energy    Poor appetite or overeating     Feeling bad about yourself - or that you are a failure or have let yourself or your family down    Trouble concentrating on things, such as reading the newspaper or watching television    Moving or speaking so slowly that other people could have noticed.  Or the opposite - being so fidgety or restless that you have been moving around a lot more than usual    Thoughts that you would be better off dead, or hurting yourself in some way    PHQ2-9 Total Score    If you checked off any problems, how difficult have these problems made it for you to do your work, take care of things at home, or get along with other people    Depression Interventions/Treatment      There were no vitals filed for this visit.    Medications Reviewed Today     Reviewed by Devra Lands, RN (Registered Nurse) on 06/29/24 at 1542  Med List Status: <None>   Medication Order Taking? Sig Documenting Provider Last Dose Status Informant  albuterol  (VENTOLIN  HFA) 108 (90 Base) MCG/ACT inhaler [id number] Yes Inhale 2 puffs into the lungs every 6 (six) hours as needed for wheezing or shortness of breath.  Malka Domino, MD  Active   amLODipine  (NORVASC ) 5 MG tablet [id number] Yes TAKE 1 TABLET BY MOUTH EVERYDAY AT BEDTIME [name], [name], MD  Active   aspirin  EC 81 MG tablet [id number] Yes Take 81 mg by mouth in the morning. Swallow whole. [provider]  Active Self  Atogepant  (QULIPTA ) 60 MG TABS [id number] Yes Take 1 tablet by mouth daily. [provider]  Active   atorvastatin  (LIPITOR) 40 MG tablet [id number] Yes TAKE 1 TABLET BY MOUTH EVERY DAY IN THE KARNA Darliss Rogue, MD  Active   benzonatate  (TESSALON ) 100 MG capsule [id number] Yes Take 1-2 capsules (100-200 mg total) by mouth 3 (three) times daily as needed for cough. Moishe Chiquita HERO, NP  Active   budeson-glycopyrrolate -formoterol  (BREZTRI  AEROSPHERE) 160-9-4.8 MCG/ACT AERO inhaler [id number] Yes Inhale 2 puffs into the lungs in the morning and at bedtime. Malka Domino, MD  Active   clobetasol ointment (TEMOVATE) 0.05 % [id number] Yes Apply 1 Application topically 2 (two) times daily. Myrla Jon HERO, MD  Active   clonazePAM  (KLONOPIN ) 2 MG tablet [id number] Yes  TAKE 1 TABLET BY MOUTH 2 TIMES DAILY. [name], [name], MD  Active   docusate sodium  (COLACE) 100 MG capsule [id number] Yes Take 100 mg by mouth in the morning. [provider]  Active Self  fenofibrate  (TRICOR ) 48 MG tablet [id number] Yes TAKE 1 TABLET BY MOUTH EVERY DAY [name], [name], MD  Active   gabapentin  (NEURONTIN ) 600 MG tablet [id number] Yes Take 600 mg by mouth 4 (four) times daily. [provider]  Active Self  ipratropium-albuterol  (DUONEB) 0.5-2.5 (3) MG/3ML SOLN [id number] Yes INHALE 3 ML BY NEBULIZER EVERY 6 HOURS AS NEEDED Assaker, Darrin, MD  Active   isosorbide  mononitrate (IMDUR ) 30 MG 24 hr tablet [id number] Yes Take 1 tablet (30 mg total) by mouth daily. Gerard Frederick, NP  Active   lisinopril  (ZESTRIL ) 20 MG tablet [id number] Yes Take 1 tablet (20 mg total) by mouth daily.  [name], [name], MD  Active   metoprolol  tartrate (LOPRESSOR ) 25 MG tablet [id number] Yes TAKE ONE-HALF TABLET BY MOUTH  TWICE DAILY [name], [name], MD  Active   naloxone  (NARCAN ) 4 MG/0.1ML LIQD nasal spray kit [id number] Yes Place 1 spray into the nose as needed (opioid overdose). [provider]  Active Self           Med Note SOILA LYLE JAYSON [place] May 03, 2021 11:23 AM)    nicotine  (NICODERM CQ  - DOSED IN MG/24 HR) 7 mg/24hr patch [id number]  Place 1 patch (7 mg total) onto the skin daily.  Patient not taking: Reported on 06/03/2024   [name], Rockie, MD  Active   nicotine  polacrilex (NICORETTE ) 4 MG gum [id number]  Take 1 each (4 mg total) by mouth as needed for smoking cessation.  Patient not taking: Reported on 06/03/2024   Sharma Rockie, MD  Active   omeprazole  (PRILOSEC) 40 MG capsule [id number] Yes Take 1 capsule (40 mg total) by mouth 2 (two) times daily. Therisa Bi, MD  Active Self  ondansetron  (ZOFRAN ) 8 MG tablet [id number] Yes TAKE 1 TABLET BY MOUTH EVERY 8 HOURS AS NEEDED FOR NAUSEA OR VOMITING. [name], [name], MD  Active   oxyCODONE  (ROXICODONE ) 15 MG immediate release tablet [id number] Yes Take 15 mg by mouth every 4 (four) hours. [provider]  Active Self  predniSONE  (DELTASONE ) 20 MG tablet [id number] Yes Take 2 tablets (40 mg total) by mouth daily with breakfast for 5 days. Moishe Chiquita HERO, NP  Active   VENTOLIN  HFA 108 (90 Base) MCG/ACT inhaler [id number] Yes USE 1 TO 2 INHALATIONS BY MOUTH  INTO THE LUNGS EVERY 6 HOURS AS  NEEDED FOR SHORTNESS OF BREATH  OR WHEEZING [name], [name], MD  Active   zolpidem  (AMBIEN ) 5 MG tablet [id number] Yes Take 1 tablet (5 mg total) by mouth at bedtime as needed for sleep. [name], Rockie, MD  Active Self            Recommendation:   PCP Follow-up Specialty provider follow-up Back Surgeon  Follow Up Plan:   Telephone follow-up in 1  month  Nestora Duos, MSN, RN [facility]  [facility], [facility] RN Care Manager Direct Dial: [phone] Fax: [phone]

## 2024-06-29 NOTE — Patient Instructions (Signed)
 " Sara Munoz, thank you for joining Chiquita CHRISTELLA Barefoot, NP for today's virtual visit.  While this provider is not your primary care provider (PCP), if your PCP is located in our provider database this encounter information will be shared with them immediately following your visit.   A [facility] MyChart account gives you access to today's visit and all your visits, tests, and labs performed at [facility]  click here if you don't have a [facility] MyChart account or go to [email or web address]  Consent: (Patient) Sara Munoz provided verbal consent for this virtual visit at the beginning of the encounter.  Current Medications:  Current Outpatient Medications:    benzonatate  (TESSALON ) 100 MG capsule, Take 1-2 capsules (100-200 mg total) by mouth 3 (three) times daily as needed for cough., Disp: 30 capsule, Rfl: 0   predniSONE  (DELTASONE ) 20 MG tablet, Take 2 tablets (40 mg total) by mouth daily with breakfast for 5 days., Disp: 10 tablet, Rfl: 0   albuterol  (VENTOLIN  HFA) 108 (90 Base) MCG/ACT inhaler, Inhale 2 puffs into the lungs every 6 (six) hours as needed for wheezing or shortness of breath., Disp: 8 g, Rfl: 2   amLODipine  (NORVASC ) 5 MG tablet, TAKE 1 TABLET BY MOUTH EVERYDAY AT BEDTIME, Disp: 90 tablet, Rfl: 1   aspirin  EC 81 MG tablet, Take 81 mg by mouth in the morning. Swallow whole., Disp: , Rfl:    Atogepant  (QULIPTA ) 60 MG TABS, Take 1 tablet by mouth daily., Disp: , Rfl:    atorvastatin  (LIPITOR) 40 MG tablet, TAKE 1 TABLET BY MOUTH EVERY DAY IN THE EVENING, Disp: 90 tablet, Rfl: 3   budeson-glycopyrrolate -formoterol  (BREZTRI  AEROSPHERE) 160-9-4.8 MCG/ACT AERO inhaler, Inhale 2 puffs into the lungs in the morning and at bedtime., Disp: 3 each, Rfl: 3   clobetasol ointment (TEMOVATE) 0.05 %, Apply 1 Application topically 2 (two) times daily., Disp: 30 g, Rfl: 0   clonazePAM  (KLONOPIN ) 2 MG tablet, TAKE 1 TABLET BY MOUTH 2 TIMES DAILY., Disp: 60 tablet, Rfl: 2    docusate sodium  (COLACE) 100 MG capsule, Take 100 mg by mouth in the morning., Disp: , Rfl:    fenofibrate  (TRICOR ) 48 MG tablet, TAKE 1 TABLET BY MOUTH EVERY DAY, Disp: 90 tablet, Rfl: 2   gabapentin  (NEURONTIN ) 600 MG tablet, Take 600 mg by mouth 4 (four) times daily., Disp: , Rfl:    ipratropium-albuterol  (DUONEB) 0.5-2.5 (3) MG/3ML SOLN, INHALE 3 ML BY NEBULIZER EVERY 6 HOURS AS NEEDED, Disp: 180 mL, Rfl: 11   isosorbide  mononitrate (IMDUR ) 30 MG 24 hr tablet, Take 1 tablet (30 mg total) by mouth daily., Disp: 90 tablet, Rfl: 3   lisinopril  (ZESTRIL ) 20 MG tablet, Take 1 tablet (20 mg total) by mouth daily., Disp: 90 tablet, Rfl: 3   metoprolol  tartrate (LOPRESSOR ) 25 MG tablet, TAKE ONE-HALF TABLET BY MOUTH  TWICE DAILY, Disp: 90 tablet, Rfl: 1   naloxone  (NARCAN ) 4 MG/0.1ML LIQD nasal spray kit, Place 1 spray into the nose as needed (opioid overdose)., Disp: , Rfl:    nicotine  (NICODERM CQ  - DOSED IN MG/24 HR) 7 mg/24hr patch, Place 1 patch (7 mg total) onto the skin daily. (Patient not taking: Reported on 06/03/2024), Disp: 30 patch, Rfl: 5   nicotine  polacrilex (NICORETTE ) 4 MG gum, Take 1 each (4 mg total) by mouth as needed for smoking cessation. (Patient not taking: Reported on 06/03/2024), Disp: 100 tablet, Rfl: 3   omeprazole  (PRILOSEC) 40 MG capsule, Take 1 capsule (40 mg total)  by mouth 2 (two) times daily., Disp: 180 capsule, Rfl: 3   ondansetron  (ZOFRAN ) 8 MG tablet, TAKE 1 TABLET BY MOUTH EVERY 8 HOURS AS NEEDED FOR NAUSEA OR VOMITING., Disp: 90 tablet, Rfl: 1   oxyCODONE  (ROXICODONE ) 15 MG immediate release tablet, Take 15 mg by mouth every 4 (four) hours., Disp: , Rfl:    VENTOLIN  HFA 108 (90 Base) MCG/ACT inhaler, USE 1 TO 2 INHALATIONS BY MOUTH  INTO THE LUNGS EVERY 6 HOURS AS  NEEDED FOR SHORTNESS OF BREATH  OR WHEEZING, Disp: 72 g, Rfl: 2   zolpidem  (AMBIEN ) 5 MG tablet, Take 1 tablet (5 mg total) by mouth at bedtime as needed for sleep., Disp: 30 tablet, Rfl: 0   Medications  ordered in this encounter:  Meds ordered this encounter  Medications   predniSONE  (DELTASONE ) 20 MG tablet    Sig: Take 2 tablets (40 mg total) by mouth daily with breakfast for 5 days.    Dispense:  10 tablet    Refill:  0    Supervising Provider:   LAMPTEY, PHILIP O [[id number]]   benzonatate  (TESSALON ) 100 MG capsule    Sig: Take 1-2 capsules (100-200 mg total) by mouth 3 (three) times daily as needed for cough.    Dispense:  30 capsule    Refill:  0    Supervising Provider:   BLAISE ALEENE KIDD [[id number]]     *If you need refills on other medications prior to your next appointment, please contact your pharmacy*  Follow-Up: Call back or seek an in-person evaluation if the symptoms worsen or if the condition fails to improve as anticipated.  [facility] [phone]  Other Instructions  - Take meds as prescribed - Rest voice - Use a cool mist humidifier especially during the winter months when heat dries out the air. - Use saline nose sprays frequently to help soothe nasal passages if they are drying out. - Stay hydrated by drinking plenty of fluids - Keep thermostat turn down low to prevent drying out which can cause a dry cough. - For any cough or congestion- robitussin DM or Delsym as needed   If you do not improve you will need a follow up visit in person.                  If you have been instructed to have an in-person evaluation today at a local Urgent Care facility, please use the link below. It will take you to a list of all of our available [facility], including address, phone number and hours of operation. Please do not delay care.  [facility]  If you or a family member do not have a primary care provider, use the link below to schedule a visit and establish care. When you choose a [facility] primary care physician or advanced practice provider, you gain a long-term partner in health. Find a Primary Care Provider  Learn  more about [facility] in-office and virtual care options: [facility] - Get Care Now  "

## 2024-06-30 ENCOUNTER — Ambulatory Visit: Admitting: Cardiology

## 2024-06-30 ENCOUNTER — Other Ambulatory Visit: Payer: Self-pay | Admitting: Family Medicine

## 2024-06-30 ENCOUNTER — Telehealth: Payer: Self-pay | Admitting: Family Medicine

## 2024-06-30 DIAGNOSIS — R42 Dizziness and giddiness: Secondary | ICD-10-CM

## 2024-06-30 NOTE — Telephone Encounter (Signed)
 Labs ordered for dizziness pt discussed with chronic care management RN, will follow up 07/22/24 during OV Please advise pt to visit the lab without an appointment Mon-Fri between 8A-11:30A or 1P-4:30P.

## 2024-06-30 NOTE — Progress Notes (Signed)
 Labs ordered for dizziness pt discussed with chronic care management RN, will follow up 07/22/24 during OV

## 2024-06-30 NOTE — Telephone Encounter (Signed)
 Have called and advised patient that lab orders are placed and gave lab hours.

## 2024-06-30 NOTE — Progress Notes (Deleted)
 " Cardiology Office Note   Date:  06/30/2024  ID:  Sara Munoz, DOB 11/12/1965, MRN [id number] PCP: Sharma Coyer, MD  [facility] Providers Cardiologist:  Redell Cave, MD { Click to update primary MD,subspecialty MD or APP then REFRESH:1}    History of Present Illness Sara Munoz is a 59 y.o. female with past medical history of nonobstructive coronary disease, mixed hyperlipidemia, primary hypertension, smoker 30+ years, COPD, anxiety, asthma, coronary artery disease, migraines, osteoarthritis, PTSD, peripheral vascular disease, TIA, who presents today for follow-up.    Previously underwent left heart catheterization in 2017 which revealed 25% left main, 25% proximal left circumflex stenosis.  Echocardiogram was completed in 2021 with an EF of 55 to 60%.  Lexiscan  Myoview  in 08/2019 revealed no ischemia and was considered low risk study.  Repeat echocardiogram completed 09/2022 with an EF of 60 to 65%.  She underwent coronary CTA on 10/2022 which revealed mild left main, proximal LAD, left circumflex stenosis 25 to 49%.   She was last seen in clinic 09/03/2023 by Dr. Cave.  At that time she had complaints of occasional chest pain.  She still continued to smoke but cut back was working on quitting.  She attributed her chest pain to being stressed out at home due to her boyfriend drinking and the patient having to be on the bills.  She was started on Imdur  15 mg daily.   She was hospitalized at [facility] from 3/21 - 09/16/2023 for symptomatic anemia.  She was transfused 2 units of PRBCs.  Hemoglobin responded appropriately to 2 units.  She was worked up for iron  deficiency anemia as an outpatient setting around 03/2023.  She underwent EGD and colonoscopy.  EGD was notable for erythematous mucosa of the stomach with pathology negative for H. pylori.  Colonoscopy was limited due to poor prep.  She was returned in 3 months to continue iron  supplementations as well as to stop BC  powder use.  Hemoglobin had dropped to 7.7.  She was to undergo a capsule endoscopic and the capsule did not exit the stomach so GI recommended follow-up as an outpatient.  She was restarted on omeprazole  40 mg twice daily.  She was treated with IV iron  and vitamin B12 injections followed by oral supplementation.  She was continued on her antihypertensive and antianginal medications.  She was last seen in clinic 12/04/2023 overall studies been doing fairly well since discharge from the hospital for previous symptomatic anemia.  She states she continued to have occasional breakthrough chest discomfort and has improved since recently being started on Imdur  last visit.  She states she has been compliant with her current medication regimen without any undue side effects.  Imdur  was increased to 30 mg daily.  There were no other medication changes that were made or further testing that was ordered at that time.    She returns to clinic today  ROS: 10 point review of system has been reviewed and considered negative exception was been listed in the HPI  Studies Reviewed      cCTA 10/28/2022 IMPRESSION: 1. Coronary calcium  score of 1092. This was 99th percentile for age and sex matched control.   2. Normal coronary origin with left dominance.   3. Mild LM, proximal LAD and LCx stenosis (25-49%).   4. Minimal RCA stenosis (<25%).   5. CAD-RADS 2. Mild non-obstructive CAD (25-49%). Consider non-atherosclerotic causes of chest pain. Consider preventive therapy and risk factor modification.   2D echo 10/21/2022  1.  Left ventricular ejection fraction, by estimation, is 60 to 65%. The  left ventricle has normal function. The left ventricle has no regional  wall motion abnormalities. There is mild left ventricular hypertrophy.  Left ventricular diastolic parameters  were normal.   2. Right ventricular systolic function is normal. The right ventricular  size is normal.   3. The mitral valve is normal  in structure. Mild mitral valve  regurgitation.   4. The aortic valve was not well visualized. Aortic valve regurgitation  is not visualized. No aortic stenosis is present.   5. The inferior vena cava is normal in size with greater than 50%  respiratory variability, suggesting right atrial pressure of 3 mmHg.    Lexiscan  MPI 09/01/2019 Blood pressure demonstrated a normal response to exercise. There was no ST segment deviation noted during stress. The study is normal. This is a low risk study. The left ventricular ejection fraction is normal (55-65%).     Risk Assessment/Calculations   No BP recorded.  {Refresh Note OR Click here to enter BP  :1}***       Physical Exam VS:  LMP 12/16/2017        Wt Readings from Last 3 Encounters:  06/03/24 184 lb (83.5 kg)  05/10/24 189 lb (85.7 kg)  04/07/24 192 lb (87.1 kg)    GEN: Well nourished, well developed in no acute distress NECK: No JVD; No carotid bruits CARDIAC: ***RRR, no murmurs, rubs, gallops RESPIRATORY:  Clear to auscultation without rales, wheezing or rhonchi  ABDOMEN: Soft, non-tender, non-distended EXTREMITIES:  No edema; No deformity   ASSESSMENT AND PLAN Mild nonobstructive coronary artery disease Mixed hyperlipidemia Primary hypertension History of symptomatic anemia COPD/asthma/continued smoking    {Are you ordering a CV Procedure (e.g. stress test, cath, DCCV, TEE, etc)?   Press F2        :[id number]}  Dispo: ***  Signed, Yomayra Tate, NP   "

## 2024-06-30 NOTE — Telephone Encounter (Signed)
-----   Message from Nurse Nestora ORN, RN sent at 06/29/2024  7:02 PM EST ----- Hello, Sara Munoz is having some dizziness and thinks she might be anemic again. She is requesting labs before her 07/22/24 appointment with you. Please let me know if questions. Thank you.

## 2024-07-01 ENCOUNTER — Telehealth

## 2024-07-07 ENCOUNTER — Encounter: Payer: Self-pay | Admitting: Neurosurgery

## 2024-07-07 ENCOUNTER — Ambulatory Visit: Admitting: Neurosurgery

## 2024-07-07 VITALS — BP 136/86 | Ht 64.0 in | Wt 184.0 lb

## 2024-07-07 DIAGNOSIS — M961 Postlaminectomy syndrome, not elsewhere classified: Secondary | ICD-10-CM

## 2024-07-07 DIAGNOSIS — M48061 Spinal stenosis, lumbar region without neurogenic claudication: Secondary | ICD-10-CM

## 2024-07-07 DIAGNOSIS — R2689 Other abnormalities of gait and mobility: Secondary | ICD-10-CM

## 2024-07-07 DIAGNOSIS — G8929 Other chronic pain: Secondary | ICD-10-CM

## 2024-07-07 DIAGNOSIS — G894 Chronic pain syndrome: Secondary | ICD-10-CM | POA: Diagnosis not present

## 2024-07-07 DIAGNOSIS — G959 Disease of spinal cord, unspecified: Secondary | ICD-10-CM | POA: Diagnosis not present

## 2024-07-07 NOTE — Progress Notes (Signed)
" ° °  DOS: 07/10/2023, thoracic decompression and fusion  Discussed the use of AI scribe software for clinical note transcription with the patient, who gave verbal consent to proceed.  History of Present Illness Sara Munoz is a 59 year old female with lumbar spinal stenosis and prior thoracic decompression and fusion who presents for evaluation of worsening lower back pain  She describes severe lumbar pain that has progressively worsened and now significantly limits daily activities despite pain medications.  She has frequent episodes of lower extremity weakness and loss of balance with sudden giving way of the legs, left worse than right, requiring use of a walker with a seat for ambulation and fall prevention.  She has had prior spine imaging and a spinal cord stimulator trial and is interested in reducing or stopping her current pain medications if neuromodulation provides adequate relief.   PHYSICAL EXAMINATION:   Vitals:   07/07/24 1336  BP: 136/86    General: Patient is well developed, well nourished, calm, collected, and in no apparent distress.  NEUROLOGICAL:  General: In no acute distress.  Awake, alert, oriented to person, place, and time. Pupils equal round and reactive to light.   Strength: Examination is baseline with largely full strength to lower extremities at least 4/5.2+ right patella, 1+ left, trace achilles.  No difficulty tandem gait.    ROS (Neurologic): Negative except as noted above  IMAGING: No updated imaging  Assessment and Plan Assessment & Plan Lumbar spinal stenosis with chronic pain and gait disturbance. Chronic Pain syndrome. Lumbar spinal stenosis with chronic pain and progressive gait disturbance, including frequent loss of balance and left leg weakness. Imaging demonstrates adequate decompression with no ongoing compression or acute pathology. Persistent pain and functional limitations remain despite prior interventions. Spinal cord  stimulation is a reasonable next step for pain management and gait improvement, with favorable anatomy for placement and prior trial with [employer] device confirmed.  She previously had a positive trial however permanent placement was initially delayed given her myelopathy at the planned entry site secondary to a large disc herniation.  She has since had this treated and is currently recovering. - Reviewed spinal imaging and confirmed adequate decompression with no acute findings. - Planned to contact [employer] to confirm device details and appropriate implantation level. - Planned to verify insurance authorization for spinal cord stimulator placement. - She would like to go forward with a spinal cord stimulator placement via thoracic laminectomy.  Risk and benefits of surgery were discussed.  She would like to go forward with the procedure.  Myelopathy Myelopathy previously related to spinal stenosis. Current imaging shows no ongoing spinal cord compression. Persistent gait disturbance and lower extremity weakness may be residual from prior myelopathy. - Reviewed imaging and confirmed absence of ongoing spinal cord compression.  Penne MICAEL Sharps, MD [facility] Neurosurgery   "

## 2024-07-07 NOTE — Patient Outreach (Signed)
 RNCM spoke with patient to reschedule 07/27/24 appointment due to provider meeting

## 2024-07-10 ENCOUNTER — Other Ambulatory Visit: Payer: Self-pay | Admitting: Family Medicine

## 2024-07-10 DIAGNOSIS — I10 Essential (primary) hypertension: Secondary | ICD-10-CM

## 2024-07-11 ENCOUNTER — Other Ambulatory Visit: Payer: Self-pay | Admitting: Family Medicine

## 2024-07-11 DIAGNOSIS — R110 Nausea: Secondary | ICD-10-CM

## 2024-07-12 ENCOUNTER — Encounter

## 2024-07-12 ENCOUNTER — Ambulatory Visit: Admitting: Pulmonary Disease

## 2024-07-13 ENCOUNTER — Other Ambulatory Visit: Payer: Self-pay

## 2024-07-13 ENCOUNTER — Ambulatory Visit: Payer: Self-pay | Admitting: Neurosurgery

## 2024-07-13 ENCOUNTER — Telehealth: Payer: Self-pay

## 2024-07-13 DIAGNOSIS — Z01818 Encounter for other preprocedural examination: Secondary | ICD-10-CM

## 2024-07-13 DIAGNOSIS — G8929 Other chronic pain: Secondary | ICD-10-CM

## 2024-07-13 DIAGNOSIS — M961 Postlaminectomy syndrome, not elsewhere classified: Secondary | ICD-10-CM

## 2024-07-13 DIAGNOSIS — G894 Chronic pain syndrome: Secondary | ICD-10-CM

## 2024-07-13 NOTE — Telephone Encounter (Signed)
 Called patient to schedule surgery and discuss surgery instructions listed below:   Please see below for information in regards to your upcoming surgery:   Planned surgery: Thoracic Laminectomy for Placement of Spinal Cord Stimulator Paddle, Placement of Internal Pulse Generator (IPG)   Surgery date: 08/03/24 at [facility] (Medical Mall: [address] [place], [place] [address]) - you will find out your arrival time the business day before your surgery.   Pre-op appointment at [facility] Pre-admit Testing: you will receive a call with a date/time for this appointment. If you are scheduled for an in person appointment, Pre-admit Testing is located on the first floor of the [facility] building, [address], [address]. During this appointment, they will advise you which medications you can take the morning of surgery, and which medications you will need to hold for surgery. Labs (such as blood work, EKG) may be done at your pre-op appointment. You are not required to fast for these labs. Should you need to change your pre-op appointment, please call Pre-admit testing at [phone].   Please bring your medication bottles or an up to date medication list to your pre-admit testing appointment (regardless of whether we have a list in your chart).    Blood thinners:   Aspirin  81mg : OK to stay on aspirin  81mg     Surgical clearance: we will send a clearance form to Rockie Agent, MD. They may wish to see you in their office prior to signing the clearance form. If so, they may call you to schedule an appointment.   Common restrictions after spine surgery: No bending, lifting, or twisting (BLT). Avoid lifting objects heavier than 10 pounds for the first 6 weeks after surgery. Where possible, avoid household activities that involve lifting, bending, reaching, pushing, or pulling such as laundry, vacuuming, grocery shopping, and childcare.  Try to arrange for help from friends and family for these activities while you heal. Do not drive while taking prescription pain medication. Weeks 6 through 12 after surgery: avoid lifting more than 25 pounds.     How to contact us :  If you have any questions/concerns before or after surgery, you can reach us  at [phone], or you can send a mychart message. We can be reached by phone or mychart 8am-4pm, Monday-Friday.  *Please note: Calls after 4pm are forwarded to a third party answering service. Mychart messages are not routinely monitored during evenings, weekends, and holidays. Please call our office to contact the answering service for urgent concerns during non-business hours.    If you have FMLA/disability paperwork, please drop it off or fax it to [phone]   Appointments/FMLA & disability paperwork: Reche Hait, & Nichole Registered Nurse/Surgery scheduler: Kendelyn, RN & Katie, RN Certified Medical Assistants: Don, CMA, Elenor, CMA, Damien, CMA, & Auston, [place] Physician Assistants: Lyle Decamp, [name], Edsel Goods, [name] & Glade Boys, [name] Surgeons: Penne Sharps, MD & Reeves Daisy, MD   [facility] PREADMIT TESTING VISIT and SURGERY INFORMATION SHEET   Now that surgery has been scheduled you can anticipate several phone calls from [facility] services. A pharmacy technician will call you to verify your current list of medications taken at home.               The Pre-Service Center will call to verify your insurance information and to give you billing estimates and information.             The Preadmit Testing Office will be calling  to schedule a visit to obtain information for the anesthesia team and provide instructions on preparation for surgery.  What can you expect for the Preadmit Testing Visit: Appointments may be scheduled in-person or by telephone.  If a telephone visit is scheduled, you may be asked to come into the office to  have lab tests or other studies performed.   This visit will not be completed any greater than 14 days prior to your surgery.  If your surgery has been scheduled for a future date, please do not be alarmed if we have not contacted you to schedule an appointment more than a month prior to the surgery date.    Please be prepared to provide the following information during this appointment:            -Personal medical history                                               -Medication and allergy list            -Any history of problems with anesthesia              -Recent lab work or diagnostic studies            -Please notify us  of any needs we should be aware of to provide the best care possible           -You will be provided with instructions on how to prepare for your surgery.    On The Day of Surgery:  You must have a driver to take you home after surgery, you will be asked not to drive for 24 hours following surgery.  Taxi, Gisele and non-medical transport will not be acceptable means of transportation unless you have a responsible individual who will be traveling with you.  Visitors in the surgical area:   2 people will be able to visit you in your room once your preparation for surgery has been completed. During surgery, your visitors will be asked to wait in the Surgery Waiting Area.  It is not a requirement for them to stay, if they prefer to leave and come back.  Your visitor(s) will be given an update once the surgery has been completed.  No visitors are allowed in the initial recovery room to respect patient privacy and safety.  Once you are more awake and transfer to the secondary recovery area, or are transferred to an inpatient room, visitors will again be able to see you.  To respect and protect your privacy: We will ask on the day of surgery who your driver will be and what the contact number for that individual will be. We will ask if it is okay to share information with this  individual, or if there is an alternative individual that we, or the surgeon, should contact to provide updates and information. If family or friends come to the surgical information desk requesting information about you, who you have not listed with us , no information will be given.   It may be helpful to designate someone as the main contact who will be responsible for updating your other friends and family.    PREADMIT TESTING OFFICE: [phone] SAME DAY SURGERY: [phone] We look forward to caring for you before and throughout the process of your surgery.

## 2024-07-15 ENCOUNTER — Ambulatory Visit: Admitting: Cardiology

## 2024-07-21 NOTE — Progress Notes (Signed)
 " Cardiology Office Note   Date:  07/23/2024  ID:  Sara Munoz, DOB 03/17/1966, MRN [id number] PCP: Sharma Coyer, MD  [facility] Providers Cardiologist:  Redell Cave, MD Cardiology APP:  Gerard Frederick, NP     History of Present Illness Sara Munoz is a 59 y.o. female with past medical history of nonobstructive coronary artery disease, mixed hyperlipidemia, pulm hypertension, smoker 30+ years, COPD, anxiety, asthma, carotid artery disease, migraines, osteoarthritis, PTSD, peripheral vascular disease, TIA, who presents today for follow-up.   Previously underwent left heart catheterization in 2017 which revealed 25% left main, 25% proximal left circumflex stenosis.  Echocardiogram was completed in 2021 with an EF of 55 to 60%.  Lexiscan  Myoview  in 08/2019 revealed no ischemia and was considered low risk study.  Repeat echocardiogram completed 09/2022 with an EF of 60 to 65%.  She underwent coronary CTA on 10/2022 which revealed mild left main, proximal LAD, left circumflex stenosis 25 to 49%.   She was seen in clinic 09/03/2023 by Dr. Cave.  At that time she had complaints of occasional chest pain.  She still continued to smoke but cut back was working on quitting.  She attributed her chest pain to being stressed out at home due to her boyfriend drinking and the patient having to be on the bills.  She was started on Imdur  15 mg daily.  She was hospitalized at [facility] from 3/21 - 09/16/2023 for symptomatic anemia. She was transfused 2 units of PRBCs. Hemoglobin responded appropriately to 2 units. She was worked up for iron  deficiency anemia as an outpatient setting around 03/2023. She underwent EGD and colonoscopy. EGD was notable for erythematous mucosa of the stomach with pathology negative for H. pylori. Colonoscopy was limited due to poor prep. She was returned in 3 months to continue iron  supplementations as well as to stop BC powder use. Hemoglobin had dropped to  7.7. She was to undergo a capsule endoscopic and the capsule did not exit the stomach so GI recommended follow-up as an outpatient. She was restarted on omeprazole  40 mg twice daily. She was treated with IV iron  and vitamin B12 injections followed by oral supplementation. She was continued on her antihypertensive and antianginal medications.   She was last seen in clinic 11/27/2023 stating overall she been doing fairly well since hospital discharge.  Hemoglobin had returned to normal even though they never found a source of her bleeding.  States she continued to have breakthrough chest discomfort but that improved since recently started Imdur  last visit.  She states she is very compliant with her current medication regimen without any undue side effects at that time.  However was increased to 30 mg daily.  There were no further testing that was ordered or other medication changes that were made.   She returns to clinic today stating that she has been doing fairly well from a cardiac perspective.  Since increasing her Imdur  at her last visit she has had no continued complaints of chest discomfort.  She continues to have back discomfort where she has an upcoming surgery scheduled for a nerve stimulator.  The trial stimulator she stated went well and worked well.  States that she has been compliant with her current medication regimen without any undue side effects.  Denies any recent hospitalizations or visits to the emergency department.  She does express desire to get off of some of her medications primarily her pain medications.  ROS: 10 point review of system has been reviewed  and considered negative except was been listed in HPI  Studies Reviewed EKG Interpretation Date/Time:  Friday July 23 2024 10:37:09 EST Ventricular Rate:  61 PR Interval:  176 QRS Duration:  80 QT Interval:  424 QTC Calculation: 426 R Axis:   36  Text Interpretation: Normal sinus rhythm Normal ECG When compared with ECG of  21-Sep-2023 23:16, No significant change since last tracing Confirmed by Gerard Frederick ([id number]) on 07/23/2024 10:39:59 AM    cCTA 10/28/2022 IMPRESSION: 1. Coronary calcium  score of 1092. This was 99th percentile for age and sex matched control.   2. Normal coronary origin with left dominance.   3. Mild LM, proximal LAD and LCx stenosis (25-49%).   4. Minimal RCA stenosis (<25%).   5. CAD-RADS 2. Mild non-obstructive CAD (25-49%). Consider non-atherosclerotic causes of chest pain. Consider preventive therapy and risk factor modification.   2D echo 10/21/2022  1. Left ventricular ejection fraction, by estimation, is 60 to 65%. The  left ventricle has normal function. The left ventricle has no regional  wall motion abnormalities. There is mild left ventricular hypertrophy.  Left ventricular diastolic parameters  were normal.   2. Right ventricular systolic function is normal. The right ventricular  size is normal.   3. The mitral valve is normal in structure. Mild mitral valve  regurgitation.   4. The aortic valve was not well visualized. Aortic valve regurgitation  is not visualized. No aortic stenosis is present.   5. The inferior vena cava is normal in size with greater than 50%  respiratory variability, suggesting right atrial pressure of 3 mmHg.    Lexiscan  MPI 09/01/2019 Blood pressure demonstrated a normal response to exercise. There was no ST segment deviation noted during stress. The study is normal. This is a low risk study. The left ventricular ejection fraction is normal (55-65%).   Risk Assessment/Calculations         Physical Exam VS:  BP 116/64 (BP Location: Left Arm, Patient Position: Sitting, Cuff Size: Normal)   Pulse 61   Ht 5' 4 (1.626 m)   Wt 186 lb (84.4 kg)   LMP 12/16/2017   SpO2 98%   BMI 31.93 kg/m        Wt Readings from Last 3 Encounters:  07/23/24 186 lb (84.4 kg)  07/22/24 186 lb (84.4 kg)  07/07/24 184 lb (83.5 kg)    GEN: Well  nourished, well developed in no acute distress NECK: No JVD; No carotid bruits CARDIAC: RRR, no murmurs, rubs, gallops RESPIRATORY:  Clear to auscultation without rales, wheezing or rhonchi  ABDOMEN: Soft, non-tender, non-distended EXTREMITIES:  No edema; No deformity   ASSESSMENT AND PLAN Mild nonobstructive coronary artery disease noted in left main, LAD, left circumflex.  She denies any recurrence of chest discomfort since being on Imdur  30 mg daily.  EKG today reveals sinus rhythm with a rate of 61 with no acute ischemic changes.  She has been continued on aspirin  81 mg daily, atorvastatin  40 mg daily, fenofibrate  48 mg daily.  No further ischemic evaluation needed at this time.  Mixed hyperlipidemia with last LDL 92 which continues to improve.  Goal is less than 70.  She has been continued on atorvastatin  40 mg daily fenofibrate  48 mg daily.  She is already having complaints of taking so much medication and declines current initiation of ezetimibe.  Will revisit on return.  Primary hypertension with a blood pressure today 116/64.  Blood pressure continues to be well-controlled.  She is continued on  amlodipine  5 mg daily, Imdur  30 mg daily, lisinopril  20 mg daily, metoprolol  to tartrate 25 mg twice daily.  She has been encouraged to continue to monitor her blood pressures 1 to 2 hours postmedication administration at home as well.  COPD with ongoing tobacco use without current exacerbation.  Total cessation continues to be recommended.  Preoperative cardiovascular examination    Ms. Eutsler's perioperative risk of a major cardiac event is 0.4% according to the Revised Cardiac Risk Index (RCRI).  Therefore, she is at low risk for perioperative complications.   Her functional capacity is good at 5.72 METs according to the Duke Activity Status Index (DASI). Recommendations: According to ACC/AHA guidelines, no further cardiovascular testing needed.  The patient may proceed to surgery at acceptable  risk.          Dispo: Patient to return to clinic to see MD/APP in 6 months or sooner if needed for further evaluation.  Signed, Malessa Zartman, NP   "

## 2024-07-22 ENCOUNTER — Encounter: Payer: Self-pay | Admitting: Family Medicine

## 2024-07-22 ENCOUNTER — Ambulatory Visit: Admitting: Family Medicine

## 2024-07-22 VITALS — BP 152/82 | HR 59 | Ht 64.0 in | Wt 186.0 lb

## 2024-07-22 DIAGNOSIS — F17210 Nicotine dependence, cigarettes, uncomplicated: Secondary | ICD-10-CM

## 2024-07-22 DIAGNOSIS — I10 Essential (primary) hypertension: Secondary | ICD-10-CM

## 2024-07-22 DIAGNOSIS — Z7409 Other reduced mobility: Secondary | ICD-10-CM

## 2024-07-22 DIAGNOSIS — F17200 Nicotine dependence, unspecified, uncomplicated: Secondary | ICD-10-CM | POA: Diagnosis not present

## 2024-07-22 DIAGNOSIS — M5416 Radiculopathy, lumbar region: Secondary | ICD-10-CM | POA: Diagnosis not present

## 2024-07-22 DIAGNOSIS — E785 Hyperlipidemia, unspecified: Secondary | ICD-10-CM | POA: Diagnosis not present

## 2024-07-22 DIAGNOSIS — J438 Other emphysema: Secondary | ICD-10-CM

## 2024-07-22 DIAGNOSIS — G4486 Cervicogenic headache: Secondary | ICD-10-CM | POA: Diagnosis not present

## 2024-07-22 DIAGNOSIS — R42 Dizziness and giddiness: Secondary | ICD-10-CM | POA: Diagnosis not present

## 2024-07-22 DIAGNOSIS — K219 Gastro-esophageal reflux disease without esophagitis: Secondary | ICD-10-CM | POA: Diagnosis not present

## 2024-07-22 DIAGNOSIS — J449 Chronic obstructive pulmonary disease, unspecified: Secondary | ICD-10-CM

## 2024-07-22 DIAGNOSIS — G8929 Other chronic pain: Secondary | ICD-10-CM

## 2024-07-22 DIAGNOSIS — R531 Weakness: Secondary | ICD-10-CM | POA: Insufficient documentation

## 2024-07-22 DIAGNOSIS — D508 Other iron deficiency anemias: Secondary | ICD-10-CM | POA: Diagnosis not present

## 2024-07-22 DIAGNOSIS — M5412 Radiculopathy, cervical region: Secondary | ICD-10-CM | POA: Diagnosis not present

## 2024-07-22 DIAGNOSIS — F411 Generalized anxiety disorder: Secondary | ICD-10-CM

## 2024-07-22 DIAGNOSIS — D638 Anemia in other chronic diseases classified elsewhere: Secondary | ICD-10-CM

## 2024-07-22 DIAGNOSIS — G894 Chronic pain syndrome: Secondary | ICD-10-CM

## 2024-07-22 MED ORDER — PREDNISONE 20 MG PO TABS: 40.0000 mg | ORAL_TABLET | Freq: Every day | ORAL | 0 refills | Status: AC

## 2024-07-22 MED ORDER — OMEPRAZOLE 40 MG PO CPDR: 40.0000 mg | DELAYED_RELEASE_CAPSULE | Freq: Two times a day (BID) | ORAL | 2 refills | Status: AC

## 2024-07-22 MED ORDER — CLONAZEPAM 2 MG PO TABS: 2.0000 mg | ORAL_TABLET | Freq: Two times a day (BID) | ORAL | 2 refills | Status: AC

## 2024-07-22 NOTE — Patient Instructions (Signed)
 To keep you healthy, please keep in mind the following health maintenance items that you are due for:   Health Maintenance Due  Topic Date Due   COVID-19 Vaccine (1) Never done   DTaP/Tdap/Td (1 - Tdap) Never done   Pneumococcal Vaccine: 50+ Years (1 of 2 - PCV) Never done   Zoster Vaccines- Shingrix (1 of 2) Never done   Medicare Annual Wellness (AWV)  08/18/2024     Best Wishes,   Dr. Lang

## 2024-07-23 ENCOUNTER — Encounter: Payer: Self-pay | Admitting: Family Medicine

## 2024-07-23 ENCOUNTER — Ambulatory Visit: Payer: Self-pay | Admitting: Family Medicine

## 2024-07-23 ENCOUNTER — Telehealth

## 2024-07-23 ENCOUNTER — Encounter: Payer: Self-pay | Admitting: Cardiology

## 2024-07-23 ENCOUNTER — Ambulatory Visit: Attending: Cardiology | Admitting: Cardiology

## 2024-07-23 VITALS — BP 116/64 | HR 61 | Ht 64.0 in | Wt 186.0 lb

## 2024-07-23 DIAGNOSIS — E782 Mixed hyperlipidemia: Secondary | ICD-10-CM

## 2024-07-23 DIAGNOSIS — Z01810 Encounter for preprocedural cardiovascular examination: Secondary | ICD-10-CM

## 2024-07-23 DIAGNOSIS — J438 Other emphysema: Secondary | ICD-10-CM

## 2024-07-23 DIAGNOSIS — I10 Essential (primary) hypertension: Secondary | ICD-10-CM | POA: Diagnosis not present

## 2024-07-23 DIAGNOSIS — I2510 Atherosclerotic heart disease of native coronary artery without angina pectoris: Secondary | ICD-10-CM | POA: Diagnosis not present

## 2024-07-23 DIAGNOSIS — Z720 Tobacco use: Secondary | ICD-10-CM | POA: Diagnosis not present

## 2024-07-23 LAB — CMP14+EGFR
ALT: 10 [IU]/L (ref 0–32)
AST: 21 [IU]/L (ref 0–40)
Albumin: 4.2 g/dL (ref 3.8–4.9)
Alkaline Phosphatase: 71 [IU]/L (ref 49–135)
BUN/Creatinine Ratio: 10 (ref 9–23)
BUN: 9 mg/dL (ref 6–24)
Bilirubin Total: 0.3 mg/dL (ref 0.0–1.2)
CO2: 24 mmol/L (ref 20–29)
Calcium: 9.5 mg/dL (ref 8.7–10.2)
Chloride: 101 mmol/L (ref 96–106)
Creatinine, Ser: 0.87 mg/dL (ref 0.57–1.00)
Globulin, Total: 2.6 g/dL (ref 1.5–4.5)
Glucose: 80 mg/dL (ref 70–99)
Potassium: 4.8 mmol/L (ref 3.5–5.2)
Sodium: 140 mmol/L (ref 134–144)
Total Protein: 6.8 g/dL (ref 6.0–8.5)
eGFR: 78 mL/min/{1.73_m2}

## 2024-07-23 LAB — LIPID PANEL
Chol/HDL Ratio: 4.5 ratio — ABNORMAL HIGH (ref 0.0–4.4)
Cholesterol, Total: 163 mg/dL (ref 100–199)
HDL: 36 mg/dL — ABNORMAL LOW
LDL Chol Calc (NIH): 92 mg/dL (ref 0–99)
Triglycerides: 204 mg/dL — ABNORMAL HIGH (ref 0–149)
VLDL Cholesterol Cal: 35 mg/dL (ref 5–40)

## 2024-07-23 LAB — CBC
Hematocrit: 43.8 % (ref 34.0–46.6)
Hemoglobin: 14.2 g/dL (ref 11.1–15.9)
MCH: 29.3 pg (ref 26.6–33.0)
MCHC: 32.4 g/dL (ref 31.5–35.7)
MCV: 90 fL (ref 79–97)
Platelets: 269 10*3/uL (ref 150–450)
RBC: 4.85 x10E6/uL (ref 3.77–5.28)
RDW: 13.8 % (ref 11.7–15.4)
WBC: 7.5 10*3/uL (ref 3.4–10.8)

## 2024-07-23 LAB — TSH+FREE T4
Free T4: 1.3 ng/dL (ref 0.82–1.77)
TSH: 1.31 u[IU]/mL (ref 0.450–4.500)

## 2024-07-23 LAB — HEMOGLOBIN A1C
Est. average glucose Bld gHb Est-mCnc: 114 mg/dL
Hgb A1c MFr Bld: 5.6 % (ref 4.8–5.6)

## 2024-07-23 NOTE — Assessment & Plan Note (Signed)
 Chronic  Anxiety managed with Klonopin . Reports occasional exacerbations but overall well-controlled. - Continue Klonopin  2 mg twice a day

## 2024-07-23 NOTE — Assessment & Plan Note (Signed)
 Patient will follow up with pain management and neurosurgeon

## 2024-07-23 NOTE — Patient Instructions (Addendum)
 Medication Instructions:  Your physician recommends that you continue on your current medications as directed. Please refer to the Current Medication list given to you today.  *If you need a refill on your cardiac medications before your next appointment, please call your pharmacy*  Lab Work: No labs ordered today  If you have labs (blood work) drawn today and your tests are completely normal, you will receive your results only by: MyChart Message (if you have MyChart) OR A paper copy in the mail If you have any lab test that is abnormal or we need to change your treatment, we will call you to review the results.  Testing/Procedures: No test ordered today   Follow-Up:  Letter will be sent to Dr. Penne Sharps, Neurosurgery.  At [facility], you and your health needs are our priority.  As part of our continuing mission to provide you with exceptional heart care, our providers are all part of one team.  This team includes your primary Cardiologist (physician) and Advanced Practice Providers or APPs (Physician Assistants and Nurse Practitioners) who all work together to provide you with the care you need, when you need it.  Your next appointment:   6 month(s)  Provider:   You may see Redell Cave, MD or one of the following Advanced Practice Providers on your designated Care Team:    Tylene Lunch, NP   We recommend signing up for the patient portal called MyChart.  Sign up information is provided on this After Visit Summary.  MyChart is used to connect with patients for Virtual Visits (Telemedicine).  Patients are able to view lab/test results, encounter notes, upcoming appointments, etc.  Non-urgent messages can be sent to your provider as well.   To learn more about what you can do with MyChart, go to [email or web address].

## 2024-07-23 NOTE — Assessment & Plan Note (Signed)
 Gastroesophageal reflux disease Chronic  Reports fever blisters possibly related to stress and lack of stomach medication. Omeprazole  prescribed to manage symptoms. - Prescribed omeprazole  - Follow up with GI specialist after surgery

## 2024-07-23 NOTE — Assessment & Plan Note (Signed)
 Chronic  Blood pressure elevated today, possibly due to pain. Current medications include amlodipine  and lisinopril . Concern about increasing blood pressure medication due to potential dizziness.  - Continue amlodipine  5 mg daily - Continue lisinopril  20 mg daily - Monitor blood pressure closely

## 2024-07-23 NOTE — Assessment & Plan Note (Signed)
 Chronic  CBC ordered

## 2024-07-23 NOTE — Assessment & Plan Note (Signed)
 Chronic  Cholesterol levels need re-evaluation. Current medication includes Lipitor and Tricor . - Ordered cholesterol panel - Continue Lipitor 40 mg daily - Continue Tricor  48 mg daily

## 2024-07-23 NOTE — Assessment & Plan Note (Signed)
 Chronic  Worsening  Referred to PT

## 2024-07-23 NOTE — Assessment & Plan Note (Signed)
 Chronic condition  COPD managed with Breztri  and albuterol . Lungs sound improved compared to previous visits. - Continue Breztri  160/9.4/4.8, two puffs morning and bedtime - Continue albuterol  as needed

## 2024-07-23 NOTE — Assessment & Plan Note (Signed)
 Chronic  Experiences dizziness and lightheadedness, similar to previous episodes of low blood levels. Anemia may contribute to current symptoms. - Ordered blood tests to evaluate anemia

## 2024-07-23 NOTE — Assessment & Plan Note (Signed)
 Chronic  Recommended neurology f/u

## 2024-07-23 NOTE — Assessment & Plan Note (Signed)
 Chronic  Sent prednisone  40mg  daily for 5 day to help with inflammation/pain

## 2024-07-23 NOTE — Assessment & Plan Note (Signed)
 Chronic  Continue f/u with neurosurgeon

## 2024-07-27 ENCOUNTER — Telehealth: Payer: Self-pay

## 2024-07-27 ENCOUNTER — Other Ambulatory Visit: Payer: Self-pay

## 2024-07-27 ENCOUNTER — Telehealth

## 2024-07-27 ENCOUNTER — Inpatient Hospital Stay
Admission: RE | Admit: 2024-07-27 | Discharge: 2024-07-27 | Disposition: A | Source: Ambulatory Visit | Attending: Neurosurgery | Admitting: Neurosurgery

## 2024-07-27 VITALS — Resp 16 | Wt 186.3 lb

## 2024-07-27 DIAGNOSIS — Z01812 Encounter for preprocedural laboratory examination: Secondary | ICD-10-CM

## 2024-07-27 DIAGNOSIS — G894 Chronic pain syndrome: Secondary | ICD-10-CM

## 2024-07-27 HISTORY — DX: Inflammatory liver disease, unspecified: K75.9

## 2024-07-27 HISTORY — DX: Dyspnea, unspecified: R06.00

## 2024-07-27 HISTORY — DX: Spinal stenosis, lumbar region without neurogenic claudication: M48.061

## 2024-07-27 HISTORY — DX: Disease of spinal cord, unspecified: G95.9

## 2024-07-27 LAB — URINALYSIS, COMPLETE (UACMP) WITH MICROSCOPIC
Bilirubin Urine: NEGATIVE
Glucose, UA: NEGATIVE mg/dL
Hgb urine dipstick: NEGATIVE
Ketones, ur: NEGATIVE mg/dL
Nitrite: NEGATIVE
Protein, ur: NEGATIVE mg/dL
Specific Gravity, Urine: 1.025 (ref 1.005–1.030)
pH: 5 (ref 5.0–8.0)

## 2024-07-27 LAB — TYPE AND SCREEN
ABO/RH(D): O POS
Antibody Screen: NEGATIVE

## 2024-07-27 LAB — SURGICAL PCR SCREEN
MRSA, PCR: NEGATIVE
Staphylococcus aureus: NEGATIVE

## 2024-07-27 NOTE — Telephone Encounter (Signed)
 Lana from [facility] called after patient reached out to them. After providing confirmation number, Gareth was able to find the appeal and stated that it was in progress and that someone was working on it. She stated usually cases are resolved within 7 business days. Informed her that our paperwork says 30 days and she stated that it never takes that long and that usually answers are provided within 7 business days.   Called patient to make aware of our phonecall.

## 2024-07-27 NOTE — Telephone Encounter (Signed)
 The patient called to reschedule her appointment for March due to upcoming back surgery. The appointment was rescheduled to 09/20/24 at 10:00 AM.

## 2024-07-29 ENCOUNTER — Encounter: Payer: Self-pay | Admitting: Neurosurgery

## 2024-07-29 NOTE — Progress Notes (Signed)
 " Perioperative / Anesthesia Services  Pre-Admission Testing Clinical Review / Pre-Operative Anesthesia Consult  Date: 07/29/24  PATIENT DEMOGRAPHICS: Name: Sara Munoz DOB: 1966-05-31 MRN:   [id number]  Note: Available PAT nursing documentation and vital signs have been reviewed. Clinical nursing staff has updated patient's PMH/PSHx, current medication list, and drug allergies/intolerances to ensure complete and comprehensive history available to assist care teams in MDM as it pertains to the aforementioned surgical procedure and anticipated anesthetic course. Extensive review of available clinical information personally performed. Nursing documentation reviewed. [place] PMH and PSHx updated with any diagnoses and/or procedures that I have knowledge of that may have been inadvertently omitted during her intake with the pre-admission testing department's nursing staff.  PLANNED SURGICAL PROCEDURE(S):   Case: [id number] Date/Time: 08/03/24 0700   Procedure: Thoracic laminectomy for placement of spinal cord stimulator paddle, placement of IPG.   Anesthesia type: General   Diagnosis:      Failed back surgical syndrome [M96.1]     Chronic pain syndrome [G89.4]     Chronic radicular lumbar pain [M54.16, G89.29]   Pre-op diagnosis: Failed back surgical syndrome, chronic pain syndrome, chronic radicular pain   Location: [facility] OR ROOM 03 / [facility]   Surgeons: Claudene Penne ORN, Sara Munoz        CLINICAL DISCUSSION: Sara Munoz is a 59 y.o. female who is submitted for pre-surgical anesthesia review and clearance prior to her undergoing the above procedure. Patient is a Current Smoker. Pertinent PMH includes: CAD, ? MI, PVD, carotid artery disease, aortic atherosclerosis, CVA/TIA, pulmonary hypertension, atypical angina, HTN, HLD, asthma, COPD,  chronic nausea, GERD (on daily PPI), anemia, OA, cervical DDD (s/p ACDF C5-C7, PSIF C5-T1), lumbar DDD (s/p PLIF L5-S1), chronic lower  back pain (on chronic opioid therapy), failed back syndrome, anxiety, depression, PTSD, insomnia (on hypnotic PRN).   Patient is followed by cardiology Thera, Sara Munoz). She was last seen in the cardiology clinic on  07/23/2024; notes reviewed. At the time of her clinic visit, patient doing well overall from a cardiovascular perspective. Patient denied any chest pain, shortness of breath, PND, orthopnea, palpitations, significant peripheral edema, weakness, fatigue, vertiginous symptoms, or presyncope/syncope. Patient with a past medical history significant for cardiovascular diagnoses. Documented physical exam was grossly benign, providing no evidence of acute exacerbation and/or decompensation of the patient's known cardiovascular conditions. Of note, complete records regarding patient's cardiovascular history unavailable for review at time of consult.  Information gathered from patient report and from notes provided by her local cardiologist.   Patient reported to have suffered an MI in approximately 2012.  Details surrounding cardiovascular event unavailable for review.   Patient has undergone multiple diagnostic LEFT heart catheterizations in the past. Patient unable to advise as to whether or not she has undergone PCI in the past.  Notes from various cardiology providers indicate the patient has at least 1 cardiac stent.   Diagnostic LEFT heart catheterization performed in 2011 revealed no significant obstructive CAD   Diagnostic LEFT heart catheterization performed on 07/07/2014 revealed multivessel CAD; 10-20% mid LM, 25% LAD, 30-40% LCx and 20-30% RCA.  Intervention was deferred opting for medical management.   Diagnostic LEFT heart catheterization was performed on 02/22/2016 revealing multivessel CAD; 25% LM and 25% proximal LCx.  Again, given the nonobstructive nature of the noted coronary artery disease, intervention was deferred opting for medical management.   Most recent myocardial  perfusion imaging study was performed on 09/01/2019 revealing a normal left ventricular systolic function  with an EF of 55-65%.  There was no evidence of stress-induced myocardial ischemia or arrhythmia; no scintigraphic evidence of scar.  Study determined to be normal and low risk.   Most recent TTE was performed on 10/21/2022 revealing a normal left ventricular systolic function with an EF of 60 to 65%.  There were no regional wall motion abnormalities.  There was mild LVH.  Right ventricular size and function normal.  There was mild mitral valve regurgitation.  All transvalvular gradients were noted to be normal providing no evidence suggestive of valvular stenosis.  Aorta normal in size with no evidence of aneurysmal dilatation.   Coronary CTA was performed on 10/28/2022 revealing an elevated coronary calcium  score of 1092.  This was in the 99th percentile for age, sex, and race matched control.  Patient with mild (25-49%) stenosis in the mid LM, proximal LAD, and proximal LCx territories.  Additionally there was minimal (< 25%) stenosis in the RCA territory.  Preventative therapy and risk factor modification was recommended.   Blood pressure well controlled at 116/64 mmHg on currently prescribed CCB (amlodipine ), nitrate (isosorbide  mononitrate), ACEi (lisinopril ), and beta-blocker (metoprolol  to tartrate) therapies.  Patient is on atorvastatin  for her HLD diagnosis and ASCVD prevention. Patient is not diabetic. She does not have an OSAH diagnosis. Patient is able to complete all of her  ADL/IADLs without cardiovascular limitation.  Per the DASI, patient is able to achieve at least 4 METS of physical activity without experiencing any significant degree of angina/anginal equivalent symptoms. No changes were made to her medication regimen during her visit with cardiology.  Patient scheduled to follow-up with outpatient cardiology in 6 months or sooner if needed.  Sara Munoz is scheduled for an  elective THORACIC LAMINECTOMY FOR PLACEMENT OF SPINAL CORD STIMULATOR PADDLE, PLACEMENT OF IPG. on 08/03/2024 with Dr. Penne Sara Sharps, Sara Munoz. Given patient's past medical history significant for cardiovascular diagnoses, presurgical cardiac clearance was sought by the PAT team. Per cardiology, Sara Munoz's perioperative risk of a major cardiac event is 0.4% according to the Revised Cardiac Risk Index (RCRI).  Therefore, she is at low risk for perioperative complications.   Her functional capacity is good at 5.72 METs according to the [place] Activity Status Index (DASI). According to ACC/AHA guidelines, no further cardiovascular testing needed.  The patient may proceed to surgery at ACCEPTABLE risk.   In review of the patient's medication reconciliation, it is noted that she is on daily oral antithrombotic therapy. Given that patient's past medical history is significant for cardiovascular diagnoses, including but not limited to CAD, neurosurgery has cleared patient to continue her daily low dose ASA throughout her perioperative course.  Patient has been updated on these directives from her specialty care providers by the PAT team.  Patient denies previous perioperative complications with anesthesia in the past. In review her EMR, it is noted that patient underwent a general anesthetic course here at [facility] ([facility] in 08/2023 without documented complications.   MOST RECENT VITAL SIGNS:    07/27/2024    1:15 PM 07/23/2024   10:32 AM 07/22/2024    1:52 PM  Vitals with BMI  Height  5' 4   Weight 186 lbs 5 oz 186 lbs   BMI 31.96 31.91   Systolic  116 152  Diastolic  64 82  Pulse  61    PROVIDERS/SPECIALISTS: NOTE: Primary physician provider listed below. Patient may have been seen by APP or partner within same practice.  PROVIDER ROLE / SPECIALTY LAST Sara Claudene Penne LELON, Sara Munoz Neurosurgery (Surgeon) 07/07/2024  Sharma Coyer, Sara Munoz Primary Care Provider  07/22/2024  Darliss Rogue, Sara Munoz Cardiology 07/23/2024   ALLERGIES: Cyclobenzaprine , Levofloxacin, Chlorpheniramine maleate, Phenergan  [promethazine  hcl], Phenylephrine  hcl, Toradol  [ketorolac  tromethamine ], Zoloft [sertraline hcl], Cephalexin , Ketorolac , and Tramadol  CURRENT HOME MEDICATIONS:  albuterol  (VENTOLIN  HFA) 108 (90 Base) MCG/ACT inhaler   amLODipine  (NORVASC ) 5 MG tablet   aspirin  EC 81 MG tablet   atorvastatin  (LIPITOR) 40 MG tablet   budeson-glycopyrrolate -formoterol  (BREZTRI  AEROSPHERE) 160-9-4.8 MCG/ACT AERO inhaler   clobetasol ointment (TEMOVATE) 0.05 %   clotrimazole -betamethasone  (LOTRISONE ) cream   fenofibrate  (TRICOR ) 48 MG tablet   gabapentin  (NEURONTIN ) 600 MG tablet   ipratropium-albuterol  (DUONEB) 0.5-2.5 (3) MG/3ML SOLN   isosorbide  mononitrate (IMDUR ) 30 MG 24 hr tablet   lisinopril  (ZESTRIL ) 20 MG tablet   metoprolol  tartrate (LOPRESSOR ) 25 MG tablet   mometasone  (ELOCON ) 0.1 % cream   naloxone  (NARCAN ) 4 MG/0.1ML LIQD nasal spray kit   ondansetron  (ZOFRAN ) 8 MG tablet   oxyCODONE  (ROXICODONE ) 15 MG immediate release tablet   VENTOLIN  HFA 108 (90 Base) MCG/ACT inhaler   zolpidem  (AMBIEN ) 5 MG tablet   Atogepant  (QULIPTA ) 60 MG TABS   clonazePAM  (KLONOPIN ) 2 MG tablet   docusate sodium  (COLACE) 100 MG capsule   omeprazole  (PRILOSEC) 40 MG capsule   predniSONE  (DELTASONE ) 20 MG tablet   HISTORY: Past Medical History:  Diagnosis Date   Anemia    Anxiety    a,) on BZO (clonazepam ) PRN   Aortic atherosclerosis    Asthma    Atypical chest pain 08/02/2015   Carotid artery disease    Chronic back pain    Chronic nausea    Chronic, continuous use of opioids 02/27/2016   a.) has naloxone  Rx available   COPD (chronic obstructive pulmonary disease) (HCC)    Coronary artery disease    a.) LHC 2011: no sig CAD (report unavailable); b.) LHC 07/07/2014: 10-20% mLM, 25% LAD, 30-40% LCx, 20-30% RCA -- med mgmt; c.) LHC 02/22/2016: 25% LM, 25% pLCx --> med  mgmt; d.) MPI 07/10/2017: no ischemia; e.) MPI 09/01/2019: no ischemia; f.) cCTA 10/28/2022: Ca score 1092 (99th percentile for age/sex/race match control)   Current smoker    DDD (degenerative disc disease), cervical    a.) s/p ACDF C5-C7 and PSIF C5-T1   DDD (degenerative disc disease), lumbar 05/23/2020   a.) s/p L5-S1 PLIF   Dyspnea    Failed back surgical syndrome    GERD (gastroesophageal reflux disease)    Hepatic steatosis    Hepatitis C virus infection cured after antiviral drug therapy    a.) s/p Tx with standard glecaprevir-pibrentasvir course   Hyperlipidemia    Hypertension    Incomplete tear of left rotator cuff    Insomnia    a.) on hypnotic PRN (zolpidem )   Long-term use of aspirin  therapy    Lumbar spinal stenosis    MI (myocardial infarction) (HCC) 2012   Migraines    MSSA (methicillin susceptible Staphylococcus aureus) septicemia (HCC) 03/03/2014   a.) TEE (-) for valvular vegitation   Myelopathy of lumbar region [facility])    Nerve pain    per patient, in the lower back   Osteoarthritis    Pneumonia    PTSD (post-traumatic stress disorder)    Pulmonary hypertension (HCC)    PVD (peripheral vascular disease)    Tendinitis of left rotator cuff    TIA (transient ischemic attack) 2014   Past Surgical  History:  Procedure Laterality Date   ANTERIOR CERVICAL DECOMP/DISCECTOMY FUSION N/A 03/30/2018   Procedure: Cervical six-seven Anterior cervical decompression/discectomy/fusion;  Surgeon: Cheryle Debby LABOR, Sara Munoz;  Location: [facility] OR;  Service: Neurosurgery;  Laterality: N/A;   ANTERIOR CERVICAL DECOMP/DISCECTOMY FUSION N/A 01/11/2019   Procedure: Cervical Five-Six Anterior cervical discectomy fusion,  Cervical Five to Cervical Seven anterior instrumented fusion;  Surgeon: Cheryle Debby LABOR, Sara Munoz;  Location: [facility] OR;  Service: Neurosurgery;  Laterality: N/A;  Cervical Five-Six Anterior cervical discectomy fusion,  Cervical Five to Cervical Seven anterior instrumented fusion    APPLICATION OF INTRAOPERATIVE CT SCAN N/A 07/10/2023   Procedure: APPLICATION OF INTRAOPERATIVE CT SCAN;  Surgeon: Claudene Penne ORN, Sara Munoz;  Location: [facility] ORS;  Service: Neurosurgery;  Laterality: N/A;   BIOPSY  04/21/2023   Procedure: BIOPSY;  Surgeon: Unk Corinn Skiff, Sara Munoz;  Location: [facility] ENDOSCOPY;  Service: Gastroenterology;;   CARDIAC CATHETERIZATION Left 02/22/2016   Procedure: Left Heart Cath and Coronary Angiography;  Surgeon: Cara JONETTA Lovelace, Sara Munoz;  Location: [facility] INVASIVE CV LAB;  Service: Cardiovascular;  Laterality: Left;   CATARACT EXTRACTION W/ INTRAOCULAR LENS IMPLANT Right    COLONOSCOPY Left 09/15/2023   Procedure: COLONOSCOPY;  Surgeon: Jinny Carmine, Sara Munoz;  Location: [facility] ENDOSCOPY;  Service: Endoscopy;  Laterality: Left;   COLONOSCOPY WITH PROPOFOL  N/A 09/19/2016   Procedure: COLONOSCOPY WITH PROPOFOL ;  Surgeon: Ruel Kung, Sara Munoz;  Location: [facility] ENDOSCOPY;  Service: Endoscopy;  Laterality: N/A;   COLONOSCOPY WITH PROPOFOL  N/A 04/21/2023   Procedure: COLONOSCOPY WITH PROPOFOL ;  Surgeon: Unk Corinn Skiff, Sara Munoz;  Location: [facility] ENDOSCOPY;  Service: Gastroenterology;  Laterality: N/A;   DIAGNOSTIC LAPAROSCOPY     ESOPHAGOGASTRODUODENOSCOPY Left 09/15/2023   Procedure: EGD (ESOPHAGOGASTRODUODENOSCOPY);  Surgeon: Jinny Carmine, Sara Munoz;  Location: [facility] ENDOSCOPY;  Service: Endoscopy;  Laterality: Left;   ESOPHAGOGASTRODUODENOSCOPY (EGD) WITH PROPOFOL  N/A 09/18/2017   Procedure: ESOPHAGOGASTRODUODENOSCOPY (EGD) WITH PROPOFOL ;  Surgeon: Kung Ruel, Sara Munoz;  Location: [facility] ENDOSCOPY;  Service: Gastroenterology;  Laterality: N/A;   ESOPHAGOGASTRODUODENOSCOPY (EGD) WITH PROPOFOL  N/A 08/10/2019   Procedure: ESOPHAGOGASTRODUODENOSCOPY (EGD) WITH PROPOFOL ;  Surgeon: Kung Ruel, Sara Munoz;  Location: [facility] ENDOSCOPY;  Service: Endoscopy;  Laterality: N/A;   ESOPHAGOGASTRODUODENOSCOPY (EGD) WITH PROPOFOL  N/A 04/21/2023   Procedure: ESOPHAGOGASTRODUODENOSCOPY (EGD) WITH PROPOFOL ;  Surgeon: Unk Corinn Skiff, Sara Munoz;  Location: [facility] ENDOSCOPY;  Service: Gastroenterology;  Laterality: N/A;   GIVENS CAPSULE STUDY N/A 09/15/2023   Procedure: IMAGING PROCEDURE, GI TRACT, INTRALUMINAL, VIA CAPSULE;  Surgeon: Jinny Carmine, Sara Munoz;  Location: [facility] ENDOSCOPY;  Service: Endoscopy;  Laterality: N/A;   HEMORRHOID SURGERY     LEFT HEART CATH AND CORONARY ANGIOGRAPHY Left 07/07/2014   LEFT HEART CATH AND CORONARY ANGIOGRAPHY Left 2011   LUMBAR WOUND DEBRIDEMENT N/A 11/05/2022   Procedure: Thoracic one spinous process removal and wound revision;  Surgeon: Cheryle Debby LABOR, Sara Munoz;  Location[facility] OR;  Service: Neurosurgery;  Laterality: N/A;   NASAL SINUS SURGERY     ORIF FEMUR FRACTURE Left    ORIF TIBIA & FIBULA FRACTURES Right    OVARIAN CYST SURGERY     POSTERIOR CERVICAL FUSION/FORAMINOTOMY N/A 05/14/2021   Procedure: Cervical five, Cervical six Laminectomy,foraminotomy with Cervical five-six Cervical six-seven Cervical seven-Thoracic one Posterior instrumented fusion;  Surgeon: Cheryle Debby LABOR, Sara Munoz;  Location[facility] OR;  Service: Neurosurgery;  Laterality: N/A;   SHOULDER ARTHROSCOPY WITH SUBACROMIAL DECOMPRESSION, ROTATOR CUFF REPAIR AND BICEP TENDON REPAIR Left 05/28/2022   Procedure: SHOULDER ARTHROSCOPY WITH DEBRIDEMENT, DECOMPRESSION, ROTATOR CUFF REPAIR AND BICEP TENDON REPAIR;  Surgeon: Edie Norleen PARAS, Sara Munoz;  Location: [facility] ORS;  Service: Orthopedics;  Laterality: Left;   SHOULDER ARTHROSCOPY WITH SUBACROMIAL DECOMPRESSION, ROTATOR CUFF REPAIR AND BICEP TENDON REPAIR Left 12/03/2022   Procedure: SHOULDER Extensive ARTHROSCOPY WITH DEBRIDEMENT, removal of Retained Foreign Body;  Surgeon: Edie Norleen PARAS, Sara Munoz;  Location: [facility] ORS;  Service: Orthopedics;  Laterality: Left;   SHOULDER ARTHROSCOPY WITH SUBACROMIAL DECOMPRESSION, ROTATOR CUFF REPAIR AND BICEP TENDON REPAIR Right 03/11/2023   Procedure: RIGHT SHOULDER ARTHROSCOPY WITH DEBRIDMENT, DECOMPRESSION and BICEPS TENODESIS.;  Surgeon: Edie Norleen PARAS, Sara Munoz;   Location: [facility] ORS;  Service: Orthopedics;  Laterality: Right;   THORACIC DISCECTOMY N/A 07/10/2023   Procedure: T8-9 & T9-10 TRANSPEDICULAR DECOMPRESSION/DISCECTOMY;  Surgeon: Claudene Penne ORN, Sara Munoz;  Location: [facility] ORS;  Service: Neurosurgery;  Laterality: N/A;   TRANSFORAMINAL LUMBAR INTERBODY FUSION W/ MIS 1 LEVEL Right 05/23/2020   Procedure: Right Lumbar Five Sacral One Minimally invasive transforaminal lumbar interbody fusion;  Surgeon: Cheryle Debby LABOR, Sara Munoz;  Location[facility] OR;  Service: Neurosurgery;  Laterality: Right;  Right Lumbar Five Sacral One Minimally invasive transforaminal lumbar interbody fusion   Family History  Problem Relation Age of Onset   Stroke Mother    Migraines Mother    Heart disease Mother    Lung cancer Mother    Ovarian cancer Mother    Healthy Brother    Healthy Brother    Healthy Brother    Breast cancer Maternal Aunt    Diabetes Maternal Uncle    Stroke Maternal Grandmother    Stroke Paternal Grandfather    Social History   Tobacco Use   Smoking status: Every Day    Current packs/day: 1.00    Average packs/day: 1 pack/day for 0.9 years (0.9 ttl pk-yrs)    Types: Cigarettes    Start date: 08/2023   Smokeless tobacco: Never   Tobacco comments:    Smokes 0.75 cigarettes a day- khj 03/30/2024        Started smoking at 59 years old.     Smoked 3 PPD at her heaviest  Substance Use Topics   Alcohol  use: Not Currently    Alcohol /week: 0.0 standard drinks of alcohol    LABS:  Hospital Outpatient Visit on 07/27/2024  Component Date Value Ref Range Status   ABO/RH(D) 07/27/2024 O POS   Final   Antibody Screen 07/27/2024 NEG   Final   Sample Expiration 07/27/2024 08/10/2024,2359   Final   Extend sample reason 07/27/2024    Final                   Value:NO TRANSFUSIONS OR PREGNANCY IN THE PAST 3 MONTHS Performed at [facility], [address] [place], [place] [address]    MRSA, PCR 07/27/2024 NEGATIVE  NEGATIVE Final   Staphylococcus  aureus 07/27/2024 NEGATIVE  NEGATIVE Final   Comment: (NOTE) The Xpert SA Assay (FDA approved for NASAL specimens in patients 98 years of age and older), is one component of a comprehensive surveillance program. It is not intended to diagnose infection nor to guide or monitor treatment. Performed at [facility], [address] [place], [place] [address]    Color, Urine 07/27/2024 YELLOW (A)  YELLOW Final   APPearance 07/27/2024 HAZY (A)  CLEAR Final   Specific Gravity, Urine 07/27/2024 1.025  1.005 - 1.030 Final   pH 07/27/2024 5.0  5.0 - 8.0 Final   Glucose, UA 07/27/2024 NEGATIVE  NEGATIVE mg/dL Final   Hgb urine dipstick 07/27/2024 NEGATIVE  NEGATIVE Final   Bilirubin Urine 07/27/2024 NEGATIVE  NEGATIVE Final   Ketones, ur 07/27/2024 NEGATIVE  NEGATIVE mg/dL Final   Protein, ur 97/96/7973 NEGATIVE  NEGATIVE mg/dL Final   Nitrite 97/96/7973 NEGATIVE  NEGATIVE Final   Leukocytes,Ua 07/27/2024 LARGE (A)  NEGATIVE Final   RBC / HPF 07/27/2024 6-10  0 - 5 RBC/hpf Final   WBC, UA 07/27/2024 11-20  0 - 5 WBC/hpf Final   Bacteria, UA 07/27/2024 RARE (A)  NONE SEEN Final   Squamous Epithelial / HPF 07/27/2024 11-20  0 - 5 /HPF Final   Mucus 07/27/2024 PRESENT   Final   Ca Oxalate Crys, UA 07/27/2024 PRESENT   Final   Performed at [facility], [address] [place], [place] [address]  Office Visit on 07/22/2024  Component Date Value Ref Range Status   WBC 07/22/2024 7.5  3.4 - 10.8 x10E3/uL Final   RBC 07/22/2024 4.85  3.77 - 5.28 x10E6/uL Final   Hemoglobin 07/22/2024 14.2  11.1 - 15.9 g/dL Final   Hematocrit 98/70/7973 43.8  34.0 - 46.6 % Final   MCV 07/22/2024 90  79 - 97 fL Final   MCH 07/22/2024 29.3  26.6 - 33.0 pg Final   MCHC 07/22/2024 32.4  31.5 - 35.7 g/dL Final   RDW 98/70/7973 13.8  11.7 - 15.4 % Final   Platelets 07/22/2024 269  150 - 450 x10E3/uL Final   TSH 07/22/2024 1.310  0.450 - 4.500 uIU/mL Final   Free T4 07/22/2024 1.30  0.82 - 1.77  ng/dL Final   Glucose 98/70/7973 80  70 - 99 mg/dL Final   BUN 98/70/7973 9  6 - 24 mg/dL Final   Creatinine, Ser 07/22/2024 0.87  0.57 - 1.00 mg/dL Final   eGFR 98/70/7973 78  >59 mL/min/1.73 Final   BUN/Creatinine Ratio 07/22/2024 10  9 - 23 Final   Sodium 07/22/2024 140  134 - 144 mmol/L Final   Potassium 07/22/2024 4.8  3.5 - 5.2 mmol/L Final   Chloride 07/22/2024 101  96 - 106 mmol/L Final   CO2 07/22/2024 24  20 - 29 mmol/L Final   Calcium  07/22/2024 9.5  8.7 - 10.2 mg/dL Final   Total Protein 98/70/7973 6.8  6.0 - 8.5 g/dL Final   Albumin  07/22/2024 4.2  3.8 - 4.9 g/dL Final   Globulin, Total 07/22/2024 2.6  1.5 - 4.5 g/dL Final   Bilirubin Total 07/22/2024 0.3  0.0 - 1.2 mg/dL Final   Alkaline Phosphatase 07/22/2024 71  49 - 135 IU/L Final   AST 07/22/2024 21  0 - 40 IU/L Final   ALT 07/22/2024 10  0 - 32 IU/L Final   Cholesterol, Total 07/22/2024 163  100 - 199 mg/dL Final   Triglycerides 98/70/7973 204 (H)  0 - 149 mg/dL Final   HDL 98/70/7973 36 (L)  >39 mg/dL Final   VLDL Cholesterol Cal 07/22/2024 35  5 - 40 mg/dL Final   LDL Chol Calc (NIH) 07/22/2024 92  0 - 99 mg/dL Final   Chol/HDL Ratio 07/22/2024 4.5 (H)  0.0 - 4.4 ratio Final   Comment:                                   T. Chol/HDL Ratio  Men  Women                               1/2 Avg.Risk  3.4    3.3                                   Avg.Risk  5.0    4.4                                2X Avg.Risk  9.6    7.1                                3X Avg.Risk 23.4   11.0    Hgb A1c MFr Bld 07/22/2024 5.6  4.8 - 5.6 % Final   Comment:          Prediabetes: 5.7 - 6.4          Diabetes: >6.4          Glycemic control for adults with diabetes: <7.0    Est. average glucose Bld gHb Est-m* 07/22/2024 114  mg/dL Final    ECG: Date: 98/69/7973  Time ECG obtained: 1037 AM Rate: 61 bpm Rhythm: normal sinus Axis (leads I and aVF): normal Intervals: PR 176 ms. QRS 80 ms. QTc 426  ms. ST segment and T wave changes: No evidence of acute T wave abnormalities or significant ST segment elevation or depression.  Evidence of a possible, age undetermined, prior infarct:  No Comparison: Similar to previous tracing obtained on 09/21/2023   IMAGING / PROCEDURES: MR THORACIC SPINE WO CONTRAST performed on 06/09/2024 Interval laminectomy and posterior fusion at T9, T9-10, and T10-11 with decompressed central canal at these levels. Stable  moderate foraminal stenosis bilaterally at T11-12.  MR LUMBAR SPINE WO CONTRAST performed on 06/09/2024 Status post diskectomy and fusion at L5-S1 without residual or recurrent stenosis. Adjacent level progressive moderate central canal and bilateral foraminal stenosis at L4-L5 due to broad-based disc protrusion and bilateral facet hypertrophy.   CT CORONARY MORPH W/CTA COR W/SCORE W/CA W/CM &/OR WO/CM performed on 10/28/2022 Coronary calcium  score of 1092. This was 99th percentile for age and sex matched control. Normal coronary origin with left dominance. Mild LM, proximal LAD and LCx stenosis (25-49%). Minimal RCA stenosis (<25%). CAD-RADS 2. Mild non-obstructive CAD (25-49%). Consider non-atherosclerotic causes of chest pain. Consider preventive therapy and risk factor modification. Aortic atherosclerosis   VAS US  ABI WITH/WO TBI performed on 10/21/2022 Resting right ankle-brachial index is within normal range. The right toe-brachial index is normal.  Resting left ankle-brachial index is within normal range. The left toe-brachial index is normal.    TRANSTHORACIC ECHOCARDIOGRAM performed of 10/21/2022 Left ventricular ejection fraction, by estimation, is 60 to 65%. The left ventricle has normal function. The left ventricle has no regional  wall motion abnormalities. There is mild left ventricular hypertrophy. Left ventricular diastolic parameters were normal.  Right ventricular systolic function is normal. The right ventricular size is  normal.  The mitral valve is normal in structure. Mild mitral valve regurgitation.  The aortic valve was not well visualized. Aortic valve regurgitation is not visualized. No aortic stenosis is present.  The inferior vena cava is normal in size with greater than 50% respiratory variability, suggesting right  atrial pressure of 3 mmHg.    MR LUMBAR SPINE WO CONTRAST performed on 09/16/2022 At L5-S1 there is posterior lumbar interbody fusion. Mild-moderate right foraminal stenosis. No left foraminal stenosis. No spinal stenosis. At L4-5 there is a broad-based disc bulge. Mild bilateral facet arthropathy. Mild spinal stenosis. Bilateral lateral recess stenosis. Mild bilateral foraminal stenosis. No acute osseous injury of the lumbar spine.   MR SHOULDER LEFT WO CONTRAST performed on 05/11/2022 Minimal bursal sided degenerative fraying of the anterior supraspinatus tendon footprint. Mild-to-moderate infraspinatus tendinosis. 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 No full-thickness tendon tear or tendon retraction. Mild degenerative changes of the acromioclavicular joint. Mild subacromial/subdeltoid bursitis.   MYOCARDIAL PERFUSION IMAGING STUDY ([facility] ) performed on 09/01/2019 Normal left ventricular systolic function with a normal LVEF of 55-65% Normal myocardial thickening and wall motion Left ventricular cavity size normal SPECT images demonstrate homogenous tracer distribution throughout the myocardium No evidence of stress-induced myocardial ischemia or arrhythmia Normal low risk study  IMPRESSION AND PLAN: Sara Munoz has been referred for pre-anesthesia review and clearance prior to her undergoing the planned anesthetic and procedural courses. Available labs, pertinent testing, and imaging results were personally reviewed by me in preparation for upcoming operative/procedural course. [facility] medical  record has been updated following extensive record review and patient interview with PAT staff.   This patient has been appropriately cleared by cardiology with an overall ACCEPTABLE risk of patient experiencing significant perioperative cardiovascular complications. here at [facility]. Based on clinical review performed today (07/29/24), barring any significant acute changes in the patient's overall condition, it is anticipated that she will be able to proceed with the planned surgical intervention. Any acute changes in clinical condition may necessitate her procedure being postponed and/or cancelled. Patient will meet with anesthesia team (Sara Munoz and/or CRNA) on the day of her procedure for preoperative evaluation/assessment. Questions regarding anesthetic course will be fielded at that time.   Pre-surgical instructions were reviewed with the patient during his PAT appointment, and questions were fielded to satisfaction by PAT clinical staff. She has been instructed on which medications that she will need to hold prior to surgery, as well as the ones that have been deemed safe/appropriate to take on the day of her procedure. As part of the general education provided by PAT, patient made aware both verbally and in writing, that she would need to abstain from the use of any illegal substances during her perioperative course. She was advised that failure to follow the provided instructions could necessitate case cancellation or result in serious perioperative complications up to and including death. Patient encouraged to contact PAT and/or her surgeon's office to discuss any questions or concerns that may arise prior to surgery; verbalized understanding.   Dorise Pereyra, MSN, APRN, FNP-C, CEN [facility]  Perioperative Services Nurse Practitioner Phone: [phone] Fax: [phone] 07/29/24 8:49 AM  NOTE: This note has been prepared using Dragon  dictation software. Despite my best ability to proofread, there is always the potential that unintentional transcriptional errors may still occur from this process. "

## 2024-07-30 ENCOUNTER — Other Ambulatory Visit: Payer: Self-pay | Admitting: Family Medicine

## 2024-07-30 ENCOUNTER — Ambulatory Visit: Payer: Self-pay | Admitting: Urgent Care

## 2024-07-30 LAB — URINE CULTURE: Culture: 10000 — AB

## 2024-08-03 ENCOUNTER — Ambulatory Visit: Admitting: Family Medicine

## 2024-08-03 ENCOUNTER — Ambulatory Visit: Admission: RE | Admit: 2024-08-03 | Source: Home / Self Care | Admitting: Neurosurgery

## 2024-08-03 ENCOUNTER — Encounter: Payer: Self-pay | Admitting: Urgent Care

## 2024-08-03 ENCOUNTER — Encounter: Admission: RE | Payer: Self-pay | Source: Home / Self Care

## 2024-08-03 DIAGNOSIS — R8271 Bacteriuria: Secondary | ICD-10-CM

## 2024-08-03 DIAGNOSIS — R8290 Unspecified abnormal findings in urine: Secondary | ICD-10-CM

## 2024-08-03 DIAGNOSIS — G8929 Other chronic pain: Secondary | ICD-10-CM | POA: Insufficient documentation

## 2024-08-03 DIAGNOSIS — G894 Chronic pain syndrome: Secondary | ICD-10-CM | POA: Insufficient documentation

## 2024-08-03 DIAGNOSIS — M961 Postlaminectomy syndrome, not elsewhere classified: Secondary | ICD-10-CM

## 2024-08-03 DIAGNOSIS — Z01812 Encounter for preprocedural laboratory examination: Secondary | ICD-10-CM

## 2024-08-03 HISTORY — DX: Postlaminectomy syndrome, not elsewhere classified: M96.1

## 2024-08-03 HISTORY — DX: Long term (current) use of aspirin: Z79.82

## 2024-08-03 HISTORY — DX: Pulmonary hypertension, unspecified: I27.20

## 2024-08-03 SURGERY — THORACIC LAMINECTOMY FOR SPINAL CORD STIMULATOR
Anesthesia: General

## 2024-08-10 ENCOUNTER — Ambulatory Visit: Admitting: Pulmonary Disease

## 2024-08-10 ENCOUNTER — Encounter

## 2024-08-17 ENCOUNTER — Encounter

## 2024-08-18 ENCOUNTER — Telehealth

## 2024-09-13 ENCOUNTER — Encounter

## 2024-09-20 ENCOUNTER — Ambulatory Visit: Admitting: Family Medicine

## 2024-10-22 ENCOUNTER — Ambulatory Visit: Admitting: Family Medicine
